# Patient Record
Sex: Male | Born: 1950 | Race: Black or African American | Hispanic: No | Smoking: Never smoker
Health system: Southern US, Community
[De-identification: ages and names within clinical notes are randomized; demographics above are authoritative.]

## PROBLEM LIST (undated history)

## (undated) DIAGNOSIS — M1990 Unspecified osteoarthritis, unspecified site: Secondary | ICD-10-CM

## (undated) DIAGNOSIS — E119 Type 2 diabetes mellitus without complications: Secondary | ICD-10-CM

## (undated) DIAGNOSIS — I509 Heart failure, unspecified: Secondary | ICD-10-CM

## (undated) DIAGNOSIS — J45909 Unspecified asthma, uncomplicated: Secondary | ICD-10-CM

## (undated) DIAGNOSIS — I5022 Chronic systolic (congestive) heart failure: Secondary | ICD-10-CM

## (undated) DIAGNOSIS — I10 Essential (primary) hypertension: Secondary | ICD-10-CM

## (undated) DIAGNOSIS — G4733 Obstructive sleep apnea (adult) (pediatric): Secondary | ICD-10-CM

## (undated) DIAGNOSIS — M109 Gout, unspecified: Secondary | ICD-10-CM

## (undated) DIAGNOSIS — I447 Left bundle-branch block, unspecified: Secondary | ICD-10-CM

## (undated) DIAGNOSIS — I2510 Atherosclerotic heart disease of native coronary artery without angina pectoris: Secondary | ICD-10-CM

## (undated) DIAGNOSIS — I255 Ischemic cardiomyopathy: Secondary | ICD-10-CM

## (undated) DIAGNOSIS — E785 Hyperlipidemia, unspecified: Secondary | ICD-10-CM

## (undated) DIAGNOSIS — I480 Paroxysmal atrial fibrillation: Secondary | ICD-10-CM

## (undated) DIAGNOSIS — Z95810 Presence of automatic (implantable) cardiac defibrillator: Secondary | ICD-10-CM

## (undated) DIAGNOSIS — I469 Cardiac arrest, cause unspecified: Secondary | ICD-10-CM

## (undated) DIAGNOSIS — E7800 Pure hypercholesterolemia, unspecified: Secondary | ICD-10-CM

## (undated) DIAGNOSIS — I499 Cardiac arrhythmia, unspecified: Secondary | ICD-10-CM

## (undated) DIAGNOSIS — Z9989 Dependence on other enabling machines and devices: Secondary | ICD-10-CM

## (undated) NOTE — *Deleted (*Deleted)
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 Profile No results for input(s): CHOL, HDL, LDLCALC, TRIG, CHOLHDL, LDLDIRECT in the last 72 hours. Thyroid function studies No results for input(s): TSH, T4TOTAL, T3FREE, THYROIDAB in the last 72 hours.  Invalid input(s): FREET3 Anemia work up No results for input(s): VITAMINB12, FOLATE, FERRITIN, TIBC, IRON, RETICCTPCT in the last 72 hours. Microbiology No results found for this or any previous visit (from the past 240 hour(s)).   Signed: Lorin Glass  [facility] 08/25/2020, 12:41 PM

---

## 2003-03-02 ENCOUNTER — Encounter: Payer: Self-pay | Admitting: Emergency Medicine

## 2003-03-02 ENCOUNTER — Inpatient Hospital Stay (HOSPITAL_COMMUNITY): Admission: EM | Admit: 2003-03-02 | Discharge: 2003-03-04 | Payer: Self-pay | Admitting: Emergency Medicine

## 2003-03-03 ENCOUNTER — Encounter: Payer: Self-pay | Admitting: *Deleted

## 2003-04-15 ENCOUNTER — Emergency Department (HOSPITAL_COMMUNITY): Admission: EM | Admit: 2003-04-15 | Discharge: 2003-04-15 | Payer: Self-pay | Admitting: Emergency Medicine

## 2004-11-10 ENCOUNTER — Emergency Department (HOSPITAL_COMMUNITY): Admission: EM | Admit: 2004-11-10 | Discharge: 2004-11-10 | Payer: Self-pay | Admitting: Emergency Medicine

## 2005-03-29 ENCOUNTER — Emergency Department (HOSPITAL_COMMUNITY): Admission: EM | Admit: 2005-03-29 | Discharge: 2005-03-29 | Payer: Self-pay | Admitting: Emergency Medicine

## 2011-04-09 ENCOUNTER — Emergency Department (HOSPITAL_COMMUNITY): Payer: No Typology Code available for payment source

## 2011-04-09 ENCOUNTER — Emergency Department (HOSPITAL_COMMUNITY)
Admission: EM | Admit: 2011-04-09 | Discharge: 2011-04-09 | Disposition: A | Discharge status: Home / Self Care | Payer: No Typology Code available for payment source | Attending: Emergency Medicine | Admitting: Emergency Medicine

## 2011-04-09 DIAGNOSIS — S8000XA Contusion of unspecified knee, initial encounter: Secondary | ICD-10-CM | POA: Insufficient documentation

## 2011-04-09 DIAGNOSIS — E7800 Pure hypercholesterolemia, unspecified: Secondary | ICD-10-CM | POA: Insufficient documentation

## 2011-04-09 DIAGNOSIS — I10 Essential (primary) hypertension: Secondary | ICD-10-CM | POA: Insufficient documentation

## 2011-04-09 DIAGNOSIS — M549 Dorsalgia, unspecified: Secondary | ICD-10-CM | POA: Insufficient documentation

## 2012-08-12 ENCOUNTER — Emergency Department (HOSPITAL_COMMUNITY)
Admission: EM | Admit: 2012-08-12 | Discharge: 2012-08-13 | Disposition: A | Discharge status: Home / Self Care | Payer: PRIVATE HEALTH INSURANCE | Attending: Emergency Medicine | Admitting: Emergency Medicine

## 2012-08-12 ENCOUNTER — Encounter (HOSPITAL_COMMUNITY): Payer: Self-pay | Admitting: Adult Health

## 2012-08-12 DIAGNOSIS — E119 Type 2 diabetes mellitus without complications: Secondary | ICD-10-CM | POA: Insufficient documentation

## 2012-08-12 DIAGNOSIS — M109 Gout, unspecified: Secondary | ICD-10-CM | POA: Insufficient documentation

## 2012-08-12 DIAGNOSIS — I10 Essential (primary) hypertension: Secondary | ICD-10-CM | POA: Insufficient documentation

## 2012-08-12 DIAGNOSIS — K047 Periapical abscess without sinus: Secondary | ICD-10-CM | POA: Insufficient documentation

## 2012-08-12 DIAGNOSIS — Z79899 Other long term (current) drug therapy: Secondary | ICD-10-CM | POA: Insufficient documentation

## 2012-08-12 HISTORY — DX: Essential (primary) hypertension: I10

## 2012-08-12 HISTORY — DX: Gout, unspecified: M10.9

## 2012-08-12 MED ORDER — OXYCODONE-ACETAMINOPHEN 5-325 MG PO TABS
2.0000 | ORAL_TABLET | Freq: Once | ORAL | Status: AC
  Administered 2012-08-13: 2 via ORAL
  Filled 2012-08-12: qty 2

## 2012-08-12 NOTE — ED Notes (Signed)
C/o upper left jaw pain and swelling that began last night and has gotten worse over 24 hours. Airway intact. Top left teeth with decay.

## 2012-08-13 MED ORDER — OXYCODONE-ACETAMINOPHEN 5-325 MG PO TABS: 1.0000 | ORAL_TABLET | Freq: Four times a day (QID) | ORAL | Status: DC | PRN

## 2012-08-13 MED ORDER — PENICILLIN V POTASSIUM 500 MG PO TABS: 500.0000 mg | ORAL_TABLET | Freq: Four times a day (QID) | ORAL | Status: AC

## 2012-08-13 NOTE — ED Notes (Signed)
Pt discharged.BP on the higher side and known by the PA.GCS 15

## 2012-08-13 NOTE — ED Provider Notes (Signed)
History     CSN: [id number]  Arrival date & time 08/12/12  2202   First MD Initiated Contact with Patient 08/12/12 2328      Chief Complaint  Patient presents with  . Dental Pain    (Consider location/radiation/quality/duration/timing/severity/associated sxs/prior treatment) HPI Comments: Patient presents today with a chief complaint of dental pain and dental abscess.  He reports that he started to notice it yesterday.  Pain has become gradually worse.  He has not noticed any drainage from the area.  He denies fever or chills.  He does not have a dentist.  He has had dental abscesses in the past.    Patient is a 61 y.o. male presenting with tooth pain. The history is provided by the patient.  Dental PainThe primary symptoms include mouth pain. Primary symptoms do not include dental injury, oral bleeding, headaches, fever or shortness of breath. The symptoms are worsening. The symptoms occur constantly.  Additional symptoms include: gum swelling and gum tenderness. Additional symptoms do not include: trismus, facial swelling, trouble swallowing, pain with swallowing and drooling.    Past Medical History  Diagnosis Date  . Hypertension   . Gout   . Diabetes mellitus without complication     History reviewed. No pertinent past surgical history.  History reviewed. No pertinent family history.  History  Substance Use Topics  . Smoking status: Never Smoker   . Smokeless tobacco: Not on file  . Alcohol Use: No      Review of Systems  Constitutional: Negative for fever and chills.  HENT: Positive for dental problem. Negative for facial swelling, drooling, trouble swallowing, neck pain, neck stiffness and voice change.   Respiratory: Negative for shortness of breath.   Cardiovascular: Negative for chest pain.  Gastrointestinal: Negative for nausea and vomiting.  Genitourinary: Negative for decreased urine volume.  Skin: Negative for color change.  Neurological: Negative for  dizziness, syncope, facial asymmetry, light-headedness and headaches.  All other systems reviewed and are negative.    Allergies  Review of patient's allergies indicates no known allergies.  Home Medications   Current Outpatient Rx  Name  Route  Sig  Dispense  Refill  . AMLODIPINE-OLMESARTAN 10-40 MG PO TABS   Oral   Take 1 tablet by mouth daily.         . FUROSEMIDE 40 MG PO TABS   Oral   Take 40 mg by mouth daily.         [name] METFORMIN HCL 1000 MG PO TABS   Oral   Take 1,000 mg by mouth daily.         [name] METOPROLOL SUCCINATE ER 200 MG PO TB24   Oral   Take 300 mg by mouth daily.         [name] ROSUVASTATIN CALCIUM 10 MG PO TABS   Oral   Take 10 mg by mouth daily.           BP 203/80  Pulse 88  Temp 97.4 F (36.3 C) (Oral)  Resp 18  SpO2 96%  Physical Exam  Nursing note and vitals reviewed. Constitutional: He appears well-developed and well-nourished. No distress.  HENT:  Head: Normocephalic and atraumatic. No trismus in the jaw.  Right Ear: Tympanic membrane and ear canal normal.  Left Ear: Tympanic membrane and ear canal normal.  Mouth/Throat: Uvula is midline and oropharynx is clear and moist. Dental abscesses and dental caries present.       Dental abscess of left upper gingiva  Neck:  Normal range of motion. Neck supple.  Cardiovascular: Normal rate, regular rhythm and normal heart sounds.   Pulmonary/Chest: Effort normal and breath sounds normal.  Lymphadenopathy:       Head (right side): No submental, no submandibular and no tonsillar adenopathy present.       Head (left side): No submental, no submandibular and no tonsillar adenopathy present.    He has no cervical adenopathy.  Neurological: He is alert.  Skin: Skin is warm and dry. He is not diaphoretic.  Psychiatric: He has a normal mood and affect.    ED Course  Procedures (including critical care time)  Labs Reviewed - No data to display No results found.   No diagnosis  found.  INCISION AND DRAINAGE Performed by: Anne Shutter, Elsye Mccollister Consent: Verbal consent obtained. Risks and benefits: risks, benefits and alternatives were discussed Type: abscess  Body area: left upper gingiva  Anesthesia: local infiltration  Local anesthetic: lidocaine 2% without epinephrine  Anesthetic total:2 ml  Drainage: purulent  Drainage amount: small  Patient tolerance: Patient tolerated the procedure well with no immediate complications.    MDM  Patient presenting with dental abscess.  Abscess incised and drained.  Patient also hypertensive.  Patient is not having any symptoms from this.  Patient is currently on Toprol-XL for his blood pressure. He reports that he checks his blood pressure every day at home and his systolic is usually 140-150.  Therefore, do not feel that blood pressure needs to be treated at this time.  Feel that elevated blood pressure is most likely secondary to pain.        Pascal Lux Nora, PA-C 08/13/12 1313  Pascal Lux [place], PA-C 08/13/12 1313

## 2012-08-13 NOTE — ED Notes (Signed)
I and D done by PA.

## 2012-08-15 NOTE — ED Provider Notes (Signed)
Medical screening examination/treatment/procedure(s) were performed by non-physician practitioner and as supervising physician I was immediately available for consultation/collaboration.   Rozena Fierro H Sahiba Granholm, MD 08/15/12 1641 

## 2012-12-15 ENCOUNTER — Encounter (HOSPITAL_COMMUNITY): Payer: Self-pay | Admitting: *Deleted

## 2012-12-15 ENCOUNTER — Emergency Department (HOSPITAL_COMMUNITY)
Admission: EM | Admit: 2012-12-15 | Discharge: 2012-12-16 | Disposition: A | Discharge status: Home / Self Care | Payer: PRIVATE HEALTH INSURANCE | Attending: Emergency Medicine | Admitting: Emergency Medicine

## 2012-12-15 DIAGNOSIS — R0789 Other chest pain: Secondary | ICD-10-CM | POA: Insufficient documentation

## 2012-12-15 DIAGNOSIS — R062 Wheezing: Secondary | ICD-10-CM | POA: Insufficient documentation

## 2012-12-15 DIAGNOSIS — R0602 Shortness of breath: Secondary | ICD-10-CM | POA: Insufficient documentation

## 2012-12-15 DIAGNOSIS — I509 Heart failure, unspecified: Secondary | ICD-10-CM | POA: Insufficient documentation

## 2012-12-15 HISTORY — DX: Unspecified asthma, uncomplicated: J45.909

## 2012-12-15 NOTE — ED Notes (Addendum)
Pt states that he has been having intermittant asthma attacks. Pt states that they are triggered by different events and that he tonight was at the store and started breathing heavy. Pt states that the least little activity starts his SOB and asthma Pt states that he used his rescue inhaler and that it helped. Pt not in an respiratory distress at this time. Talking in full sentences. Pt has no wheezing.

## 2012-12-16 ENCOUNTER — Emergency Department (HOSPITAL_COMMUNITY): Payer: PRIVATE HEALTH INSURANCE

## 2012-12-16 LAB — CBC WITH DIFFERENTIAL/PLATELET
Basophils Absolute: 0 10*3/uL (ref 0.0–0.1)
Basophils Relative: 0 % (ref 0–1)
Eosinophils Absolute: 0.6 10*3/uL (ref 0.0–0.7)
Eosinophils Relative: 5 % (ref 0–5)
HCT: 39.4 % (ref 39.0–52.0)
Hemoglobin: 13.2 g/dL (ref 13.0–17.0)
Lymphocytes Relative: 21 % (ref 12–46)
Lymphs Abs: 2.6 10*3/uL (ref 0.7–4.0)
MCH: 26.2 pg (ref 26.0–34.0)
MCHC: 33.5 g/dL (ref 30.0–36.0)
MCV: 78.3 fL (ref 78.0–100.0)
Monocytes Absolute: 1 10*3/uL (ref 0.1–1.0)
Monocytes Relative: 8 % (ref 3–12)
Neutro Abs: 8.4 10*3/uL — ABNORMAL HIGH (ref 1.7–7.7)
Neutrophils Relative %: 66 % (ref 43–77)
Platelets: 198 10*3/uL (ref 150–400)
RBC: 5.03 MIL/uL (ref 4.22–5.81)
RDW: 14 % (ref 11.5–15.5)
WBC: 12.6 10*3/uL — ABNORMAL HIGH (ref 4.0–10.5)

## 2012-12-16 LAB — BASIC METABOLIC PANEL
BUN: 11 mg/dL (ref 6–23)
CO2: 24 mEq/L (ref 19–32)
Calcium: 10.2 mg/dL (ref 8.4–10.5)
Chloride: 102 mEq/L (ref 96–112)
Creatinine, Ser: 0.9 mg/dL (ref 0.50–1.35)
GFR calc Af Amer: >90 mL/min (ref >90)
GFR calc non Af Amer: 89 mL/min — ABNORMAL LOW (ref >90)
Glucose, Bld: 111 mg/dL — ABNORMAL HIGH (ref 70–99)
Potassium: 3.7 mEq/L (ref 3.5–5.1)
Sodium: 140 mEq/L (ref 135–145)

## 2012-12-16 LAB — TROPONIN I: Troponin I: <0.3 ng/mL (ref <0.30)

## 2012-12-16 LAB — PRO B NATRIURETIC PEPTIDE: Pro B Natriuretic peptide (BNP): 1529 pg/mL — ABNORMAL HIGH (ref 0–125)

## 2012-12-16 MED ORDER — PREDNISONE 20 MG PO TABS
40.0000 mg | ORAL_TABLET | Freq: Once | ORAL | Status: AC
  Administered 2012-12-16: 40 mg via ORAL
  Filled 2012-12-16: qty 2

## 2012-12-16 MED ORDER — AEROCHAMBER PLUS W/MASK MISC: 1.0000 | Freq: Once | Status: DC

## 2012-12-16 MED ORDER — IPRATROPIUM-ALBUTEROL 20-100 MCG/ACT IN AERS
1.0000 | INHALATION_SPRAY | Freq: Four times a day (QID) | RESPIRATORY_TRACT | Status: DC | PRN
  Administered 2012-12-16: 1 via RESPIRATORY_TRACT
  Filled 2012-12-16: qty 4

## 2012-12-16 MED ORDER — AEROCHAMBER PLUS W/MASK MISC
Status: AC
  Filled 2012-12-16: qty 1

## 2012-12-16 NOTE — ED Provider Notes (Signed)
Date: 12/16/2012  Rate: 82  Rhythm: normal sinus rhythm  QRS Axis: Left axis deviation  Intervals: normal  ST/T Wave abnormalities: normal  Conduction Disutrbances: Left bundle branch block  Narrative Interpretation: Left bundle branch block, this is not a new finding - EKG morphology looks the same as prior ECG      [name], MD 12/17/12 1338

## 2012-12-16 NOTE — ED Provider Notes (Signed)
History     CSN: [id number]  Arrival date & time 12/15/12  2259   First MD Initiated Contact with Patient 12/15/12 2351      Chief Complaint  Patient presents with  . Shortness of Breath    (Consider location/radiation/quality/duration/timing/severity/associated sxs/prior treatment) HPI Comments: Patient is a 62 year old gentleman with a history of hypertension, diabetes, and asthma who presents for increasing episodes of shortness of breath over the last 2 weeks.  Patient states that he has found himself becoming short of breath twice a day for the last 2 weeks.  Patient was evaluated by his primary care physician and thought to have asthma.  He was prescribed a Combivent inhaler to take as needed for shortness of breath and wheezing.  Patient states he uses his inhaler with these episodes with some relief of symptoms. He states that lying flat on his back makes the symptoms worse.  Patient denies any associated symptoms including fever or recent illness, lightheadedness or dizziness, chest pain, nausea or vomiting, abdominal pain, numbness or tingling in his extremities, leg swelling or syncope.  Patient is a 62 y.o. male presenting with shortness of breath. The history is provided by the patient. No language interpreter was used.  Shortness of Breath Associated symptoms: no abdominal pain, no chest pain, no fever and no vomiting     Past Medical History  Diagnosis Date  . Hypertension   . Gout   . Diabetes mellitus without complication   . Asthma     History reviewed. No pertinent past surgical history.  History reviewed. No pertinent family history.  History  Substance Use Topics  . Smoking status: Never Smoker   . Smokeless tobacco: Not on file  . Alcohol Use: No      Review of Systems  Constitutional: Negative for fever and chills.  Eyes: Negative for visual disturbance.  Respiratory: Positive for chest tightness and shortness of breath.   Cardiovascular: Negative  for chest pain and leg swelling.  Gastrointestinal: Negative for nausea, vomiting and abdominal pain.  Genitourinary: Negative.   Skin: Negative for color change and pallor.  Neurological: Negative for dizziness, syncope, weakness, light-headedness and numbness.  All other systems reviewed and are negative.    Allergies  Review of patient's allergies indicates no known allergies.  Home Medications   Current Outpatient Rx  Name  Route  Sig  Dispense  Refill  . allopurinol (ZYLOPRIM) 100 MG tablet   Oral   Take 100 mg by mouth daily.         [name] amLODipine-olmesartan (AZOR) 10-40 MG per tablet   Oral   Take 1 tablet by mouth daily.         . furosemide (LASIX) 40 MG tablet   Oral   Take 40 mg by mouth daily.         . Ipratropium-Albuterol (COMBIVENT RESPIMAT) 20-100 MCG/ACT AERS respimat   Inhalation   Inhale 1 puff into the lungs every 6 (six) hours as needed for wheezing or shortness of breath.         . metFORMIN (GLUCOPHAGE) 1000 MG tablet   Oral   Take 1,000 mg by mouth daily.         . metoprolol (TOPROL-XL) 200 MG 24 hr tablet   Oral   Take 300 mg by mouth daily.         . rosuvastatin (CRESTOR) 10 MG tablet   Oral   Take 10 mg by mouth daily.  BP 183/89  Pulse 77  Temp(Src) 98 F (36.7 C) (Oral)  Resp 18  SpO2 99%  Physical Exam  Nursing note and vitals reviewed. Constitutional: He is oriented to person, place, and time. No distress.  Morbidly obese male  HENT:  Head: Normocephalic and atraumatic.  Right Ear: External ear normal.  Left Ear: External ear normal.  Mouth/Throat: Oropharynx is clear and moist. No oropharyngeal exudate.  Eyes: Conjunctivae and EOM are normal. Pupils are equal, round, and reactive to light. No scleral icterus.  Neck: Normal range of motion. Neck supple.  Cardiovascular: Normal rate, regular rhythm, normal heart sounds and intact distal pulses.   Capillary refill less than 2 seconds in bilateral  upper extremities.  Distal radial, dorsalis pedis, and posterior tibial pulses 2+ bilaterally. No lower extremity pitting edema appreciated.  Pulmonary/Chest: Effort normal. No respiratory distress. He has wheezes. He has no rales. He exhibits no tenderness.  Mild expiratory wheezes appreciated in the mid left and right lower lung fields; moving air well.  Abdominal: Soft. He exhibits no distension. There is no tenderness. There is no rebound and no guarding.  Musculoskeletal: Normal range of motion. He exhibits no edema.  Lymphadenopathy:    He has no cervical adenopathy.  Neurological: He is alert and oriented to person, place, and time.  Skin: Skin is warm and dry. He is not diaphoretic. No erythema.  Psychiatric: He has a normal mood and affect. His behavior is normal.    ED Course  Procedures (including critical care time)  Labs Reviewed  CBC WITH DIFFERENTIAL - Abnormal; Notable for the following:    WBC 12.6 (*)    Neutro Abs 8.4 (*)    All other components within normal limits  BASIC METABOLIC PANEL - Abnormal; Notable for the following:    Glucose, Bld 111 (*)    GFR calc non Af Amer 89 (*)    All other components within normal limits  PRO B NATRIURETIC PEPTIDE - Abnormal; Notable for the following:    Pro B Natriuretic peptide (BNP) 1529.0 (*)    All other components within normal limits  TROPONIN I   Dg Chest 2 View  12/16/2012  *RADIOLOGY REPORT*  Clinical Data: Short of breath.  Asthma.  CHEST - 2 VIEW  Comparison: None.  Findings: Cardiomegaly is present.   There is basilar atelectasis. No focal consolidation.  Thoracic spine DISH.  Pulmonary vascular congestion is present with cephalization of pulmonary blood flow. Trachea midline.  IMPRESSION: Cardiomegaly and pulmonary vascular congestion.   Original Report Authenticated By: Andreas Newport, M.D.     Date: 12/16/2012  Rate: 82  Rhythm: normal sinus rhythm  QRS Axis: left  Intervals: normal  ST/T Wave  abnormalities: normal  Conduction Disutrbances:left bundle branch block  Narrative Interpretation: NSR with LAD and LBBB unchanged from prior; no STEMI  Old EKG Reviewed: unchanged from 2004    1. Shortness of breath   2. Congestive heart disease      MDM  Patient presents for increasing episodes of shortness of breath x2 weeks.  Patient was seen by his primary care provider and given an albuterol inhaler for asthma-like symptoms.  Chest x-ray today significant for cardiomegaly and pulmonary vascular congestion consistent with congestive heart failure.  Basic labs as well as a BNP, troponin level, and EKG ordered for further evaluation of the patient's symptoms.  Patient currently well and nontoxic appearing and in no acute distress.  BNP elevated to 1529; troponin level normal and EKG  findings unchanged from prior in 2004. Patient vitals have remained stable, satting 99% on RA without tachycardia or tachypnea. Patient stable for d/c with PCP follow up regarding his congestive heart disease findings. He will be given a spacer to use with his combivent inhaler as well. Patient states comfort and understanding with this d/c plan with no unaddressed concerns. Patient seen, also, by Dr. Rulon Abide who is in agreement with this work up and management plan.      Antony Madura, PA-C 12/17/12 1324

## 2012-12-17 NOTE — ED Provider Notes (Signed)
Medical screening examination/treatment/procedure(s) were conducted as a shared visit with non-physician practitioner(s) and myself.  I personally evaluated the patient during the encounter [name], M.D.  Dean [name] is a 62 y.o. male past medical history significant for diabetes, hypertension and asthma. He says he's had intermittent worsening of his shortness of breath over the course of the last 2 weeks, occasionally he has found it difficult to lay flat. He was recently diagnosed with asthma by his primary care provider and given a Combivent inhaler, has not been using this with a spacer. He's had wheezing associated with the shortness of breath and denies any chest pains. When he uses the inhaler it relieves his shortness of breath which is described as moderate. Denies any syncope, recent illness, fevers or chills, abdominal pain, nausea vomiting, leg swelling. Denies rash or diarrhea.  At least 10pt or greater review of systems completed and are negative except where specified in the HPI.  VITAL SIGNS:   Filed Vitals:   12/16/12 0303  BP: 183/89  Pulse: 77  Temp: 98 F (36.7 C)  Resp: 18   CONSTITUTIONAL: Awake, oriented, appears non-toxic HENT: Atraumatic, normocephalic, oral mucosa pink and moist, airway patent. Nares patent without drainage. External ears normal. EYES: Conjunctiva clear, EOMI, PERRLA NECK: Trachea midline, non-tender, supple CARDIOVASCULAR: Normal heart rate, Normal rhythm, No murmurs, rubs, gallops PULMONARY/CHEST: Clear to auscultation, no rhonchi, wheezes, or rales. Symmetrical breath sounds. Non-tender. ABDOMINAL: Obese, non-distended, soft, non-tender - no rebound or guarding.  BS normal. NEUROLOGIC: Non-focal, moving all four extremities, no gross sensory or motor deficits. EXTREMITIES: No clubbing, cyanosis, or edema SKIN: Warm, Dry, No erythema, No rash   Labs Reviewed  CBC WITH DIFFERENTIAL - Abnormal; Notable for the following:    WBC  12.6 (*)    Neutro Abs 8.4 (*)    All other components within normal limits  BASIC METABOLIC PANEL - Abnormal; Notable for the following:    Glucose, Bld 111 (*)    GFR calc non Af Amer 89 (*)    All other components within normal limits  PRO B NATRIURETIC PEPTIDE - Abnormal; Notable for the following:    Pro B Natriuretic peptide (BNP) 1529.0 (*)    All other components within normal limits  TROPONIN I    Dg Chest 2 View  12/16/2012  *RADIOLOGY REPORT*  Clinical Data: Short of breath.  Asthma.  CHEST - 2 VIEW  Comparison: None.  Findings: Cardiomegaly is present.   There is basilar atelectasis. No focal consolidation.  Thoracic spine DISH.  Pulmonary vascular congestion is present with cephalization of pulmonary blood flow. Trachea midline.  IMPRESSION: Cardiomegaly and pulmonary vascular congestion.   Original Report Authenticated By: Andreas Newport, M.D.     MDM: Dean [name] is a 62 y.o. male with a recent asthma diagnosis presents with shortness of breath x2 weeks. Given the context of his shortness of breath I doubt anginal equivalent, I doubt coronary syndrome, pulmonary embolism (low risk by Wells criteria.) Patient may have some intermittent shortness of breath associated with hypertension however on PAs initial exam the patient was wheezing. On my exam the patient has are that breathing treatments he is feeling better and is no longer wheezing. We'll have the patient followup with primary care physician, also will give the patient a spacer to use with the inhaler which will maximize the efficacy of the bronchodilators. I explained this to the patient he understands accepts the medical plan, questions have been answered.   I also  reviewed the EKG, pertinent labs and nursing notes today.    [name], MD 12/17/12 1338

## 2013-01-01 ENCOUNTER — Encounter: Payer: Self-pay | Admitting: Neurology

## 2013-01-02 NOTE — Progress Notes (Signed)
Quick Note:  Patient needs to improve his compliance on CPAP. Currently at 77 percent. Good resolution of apnea when the machine is used. The DME is [facility] . ______

## 2013-04-04 ENCOUNTER — Telehealth: Payer: Self-pay | Admitting: *Deleted

## 2013-04-04 NOTE — Telephone Encounter (Signed)
Called patient to schedule a follow up appt with Dr. Vickey Huger at Dr. Wilmer Floor request.  Person answered and said Dean [name] was out, they would not take a message.  Asked me to call back in 2-3 hours.

## 2013-05-07 ENCOUNTER — Encounter: Payer: Self-pay | Admitting: Neurology

## 2013-07-17 ENCOUNTER — Other Ambulatory Visit: Payer: Self-pay | Admitting: Internal Medicine

## 2013-07-17 DIAGNOSIS — M79604 Pain in right leg: Secondary | ICD-10-CM

## 2013-07-17 DIAGNOSIS — M79605 Pain in left leg: Secondary | ICD-10-CM

## 2013-07-17 DIAGNOSIS — E119 Type 2 diabetes mellitus without complications: Secondary | ICD-10-CM

## 2013-07-23 ENCOUNTER — Other Ambulatory Visit: Payer: PRIVATE HEALTH INSURANCE

## 2013-08-01 ENCOUNTER — Other Ambulatory Visit: Payer: PRIVATE HEALTH INSURANCE

## 2013-08-25 ENCOUNTER — Ambulatory Visit
Admission: RE | Admit: 2013-08-25 | Discharge: 2013-08-25 | Disposition: A | Discharge status: Home / Self Care | Payer: Medicare Other | Source: Ambulatory Visit | Attending: Internal Medicine | Admitting: Internal Medicine

## 2013-08-25 DIAGNOSIS — E119 Type 2 diabetes mellitus without complications: Secondary | ICD-10-CM

## 2013-08-25 DIAGNOSIS — M79605 Pain in left leg: Secondary | ICD-10-CM

## 2013-08-25 DIAGNOSIS — M79604 Pain in right leg: Secondary | ICD-10-CM

## 2014-03-02 ENCOUNTER — Encounter: Payer: Medicare Other | Attending: Internal Medicine | Admitting: Dietician

## 2014-03-02 ENCOUNTER — Encounter: Payer: Self-pay | Admitting: Dietician

## 2014-03-02 VITALS — Ht 66.0 in | Wt 295.0 lb

## 2014-03-02 DIAGNOSIS — E669 Obesity, unspecified: Secondary | ICD-10-CM | POA: Diagnosis not present

## 2014-03-02 DIAGNOSIS — E6609 Other obesity due to excess calories: Secondary | ICD-10-CM | POA: Insufficient documentation

## 2014-03-02 DIAGNOSIS — Z6841 Body Mass Index (BMI) 40.0 and over, adult: Secondary | ICD-10-CM | POA: Insufficient documentation

## 2014-03-02 DIAGNOSIS — I10 Essential (primary) hypertension: Secondary | ICD-10-CM | POA: Insufficient documentation

## 2014-03-02 DIAGNOSIS — Z6835 Body mass index (BMI) 35.0-35.9, adult: Secondary | ICD-10-CM | POA: Insufficient documentation

## 2014-03-02 DIAGNOSIS — E1169 Type 2 diabetes mellitus with other specified complication: Secondary | ICD-10-CM | POA: Insufficient documentation

## 2014-03-02 DIAGNOSIS — Z713 Dietary counseling and surveillance: Secondary | ICD-10-CM | POA: Diagnosis not present

## 2014-03-02 DIAGNOSIS — E119 Type 2 diabetes mellitus without complications: Secondary | ICD-10-CM | POA: Diagnosis not present

## 2014-03-02 NOTE — Progress Notes (Signed)
Appt start time: 1100 end time:  1200.  Assessment:  Patient was seen on 03/02/14 for individual diabetes education. Pt reports with very good control of T2DM, and cholesterol. His primary concerns for today are related to weight control. No previous formal T2DM education.  Current HbA1c: 6.4  Preferred Learning Style:   No preference indicated   Learning Readiness:   Contemplating  MEDICATIONS: see list.  DIETARY INTAKE: Usual eating pattern includes 1-3 meals and 1-2 snacks per day. Everyday foods include sweet tea, fruit, chix.  Avoided foods include none noted.    24-hr recall:  B ( AM): sausage and egg mcmuffin once or twice per week. Skips breakfast most days. Water, sweet tea.   Snk ( AM): none  L ( PM): chix sandwich (either fried or grilled) from [employer], small fry, tea. Sometimes skips lunch, especially if he ate breakfast.  Snk ( PM): tries to eat a lot of fruit- apples, grapes, bananas, oranges. Water or tea. Occasional soda.  D ( PM): mostly chix (usually baked, fried about 1-2 times per month- pt has cut back), not much pork, hamburger. Sides include pinto beans, baked beans, black eyed peas, pork and beans, broccoli, turnip greens, cabbage, collards. Cutting back on sweets, homemade cake at grandson's birthday. Snk ( PM): fruit Beverages: water, sweet tea, coffee (with 2 spoons cream and a couple spoons sugar) once or twice per week, OJ, no kool aid or lemonade, no EtOH.    Usual physical activity: Used to walk more, but in last few years not much. Owns a treadmill. Has access to a gym through insurance. Pt notes muscle pain when walking, possibly related to statin use, limited to calf muscle on most occasions.  Progress Towards Goal(s):  In progress.   Nutritional Diagnosis:  NB-1.1 Food and nutrition-related knowledge deficit As related to lack of previous DM education.  As evidenced by pt statements of same, high intake of sweetened beverages.    Intervention:   Nutrition counseling provided.  Discussed diabetes disease process and treatment options.  Discussed physiology of diabetes and role of obesity on insulin resistance.  Encouraged moderate weight reduction to improve glucose levels.  Discussed role of medications and diet in glucose control  Provided education on macronutrients on glucose levels.  Provided education on carb counting, importance of regularly scheduled meals/snacks, and meal planning  Discussed effects of physical activity on glucose levels and long-term glucose control.  Recommended 150 minutes of physical activity/week.  Reviewed patient medications.  Discussed role of medication on blood glucose and possible side effects  Discussed blood glucose monitoring and interpretation.  Discussed recommended target ranges and individual ranges.    Described short-term complications: hyper- and hypo-glycemia.  Discussed causes,symptoms, and treatment options.  Discussed prevention, detection, and treatment of long-term complications.  Discussed the role of prolonged elevated glucose levels on body systems.  Discussed role of stress on blood glucose levels and discussed strategies to manage psychosocial issues.  Discussed recommendations for long-term diabetes self-care.  Established checklist for medical, dental, and emotional self-care.  Teaching Method Utilized:  Visual Auditory  Handouts given during visit include:  Living Well With Diabetes  The Plate Method  Barriers to learning/adherence to lifestyle change: previous dietary habits, muscle pain  Diabetes self-care support plan:   [facility] support group  RD advised pt to eat 3 protein-rich meals per day with goal of at least 30 grams per meal (RD demonstrated methods to accomplish), only one CHO rich food at lunch, and utilize  the Plate Method for dinner. RD also advised to discontinue sweetened beverages. RD advised increase dosage of vitamin D to 5000 IUs per day to treat  muscle pain. RD advised 30 minutes per day exercise progressing to 60 minutes per day by increasing 5 minutes per day q 2 weeks.  Demonstrated degree of understanding via:  Teach Back   Monitoring/Evaluation:  Dietary intake, exercise, portion control, meal pattern, and body weight in 2 month(s).

## 2014-05-04 ENCOUNTER — Ambulatory Visit: Payer: Medicare Other | Admitting: Dietician

## 2014-05-04 ENCOUNTER — Ambulatory Visit: Payer: Medicare Other | Admitting: *Deleted

## 2014-06-04 ENCOUNTER — Encounter: Payer: Medicare Other | Attending: Internal Medicine | Admitting: *Deleted

## 2014-06-04 DIAGNOSIS — I10 Essential (primary) hypertension: Secondary | ICD-10-CM | POA: Diagnosis not present

## 2014-06-04 DIAGNOSIS — E669 Obesity, unspecified: Secondary | ICD-10-CM | POA: Diagnosis not present

## 2014-06-04 DIAGNOSIS — Z713 Dietary counseling and surveillance: Secondary | ICD-10-CM | POA: Insufficient documentation

## 2014-06-04 DIAGNOSIS — Z6841 Body Mass Index (BMI) 40.0 and over, adult: Secondary | ICD-10-CM | POA: Diagnosis not present

## 2014-06-04 DIAGNOSIS — E119 Type 2 diabetes mellitus without complications: Secondary | ICD-10-CM | POA: Insufficient documentation

## 2014-06-04 NOTE — Progress Notes (Signed)
Appt start time: 1100 end time:  1130.  Assessment:  Patient was seen on 06/03/14 for follow up individual diabetes education.  He was seen by Daine Floras, RD in June.  Dean [name] states he has been watching what he eats since last visit.  He also walks some, but not as much as he feels like he should.  However, dietary recall reveals no changes to his food intake.  He reports not understanding proper portions and what foods contain carbohydrates.  He did receive very thorough education at his last visit   Current HbA1c: not sure right now.  Haven't gotten results back yet from last week's test  Preferred Learning Style:   No preference indicated   Learning Readiness:   Contemplating  MEDICATIONS: see list.  DIETARY INTAKE: Usual eating pattern includes 1-3 meals and 1-2 snacks per day. Everyday foods include sweet tea, fruit, chix.  Avoided foods include none noted.    24-hr recall:  B ( AM): eggs, grits, sausage patti, cereal; english muffin. Skips 3 days.  Might go out 1-2 times a week.  Drinks tea or water Snk ( AM): none  L ( PM): chicken sandwich or hamburger with fries sometimes; sub sandwich; skips 2-3 times Snk ( PM): not usually D ( PM): hot dog; beef; chicken occasionally rice or salad or broccoli or sometimes beans or turnip greens.  Vegetables 4 times Snk ( PM): fruit: nectarine, peaches, grapes Beverages: water, sweet tea, coffee (with 2 spoons cream and a couple spoons sugar) once or twice per week, OJ, no kool aid or lemonade, no EtOH.    Usual physical activity: walks rarely .  Has treadmill at home, but doesn't use it.  Complains of leg problems.  Feels like his Crestor causes joint problems   Progress Towards Goal(s):  In progress.   Nutritional Diagnosis:  NB-1.1 Food and nutrition-related knowledge deficit As related to lack of understanding previous DM education.  As evidenced by pt statements of same, high intake of sweetened beverages.    Intervention:   Nutrition counseling provided.  Discussed importance of physical activity for health, especially for diabetes management.  Also reviewed the MyPlate recommendations for meal planning: decreasing starches and increasing vegetables. Recommended 3 meals/day and to avoid meal skipping.  Recommended limiting sugary beverages like tea.  Teaching Method Utilized:  Visual Auditory  Handouts given during visit include:  Meal plan card  The Plate Method  Barriers to learning/adherence to lifestyle change: previous dietary habits, muscle pain  Diabetes self-care support plan:   [facility] support group   Demonstrated degree of understanding via:  Teach Back   Monitoring/Evaluation:  Dietary intake, exercise, portion control, meal pattern, and body weight in 3 month(s).

## 2014-06-04 NOTE — Patient Instructions (Signed)
Aim for 20 minutes most days for physical activity- walk on your treadmill for maybe 5-10 minutes at a time a couple times a day Follow MyPlate recommendations for meal planning: smaller portion starch, and more vegetables Avoid meal skipping- eat 3 meals each day Switch to unsweet tea with Splenda instead of regular tea

## 2014-09-03 ENCOUNTER — Ambulatory Visit: Payer: Medicare Other | Admitting: *Deleted

## 2015-05-17 ENCOUNTER — Other Ambulatory Visit: Payer: Self-pay | Admitting: Orthopedic Surgery

## 2015-05-17 DIAGNOSIS — M545 Low back pain: Secondary | ICD-10-CM

## 2015-05-17 DIAGNOSIS — R29898 Other symptoms and signs involving the musculoskeletal system: Secondary | ICD-10-CM

## 2015-06-04 ENCOUNTER — Ambulatory Visit
Admission: RE | Admit: 2015-06-04 | Discharge: 2015-06-04 | Disposition: A | Discharge status: Home / Self Care | Payer: Medicare Other | Source: Ambulatory Visit | Attending: Internal Medicine | Admitting: Internal Medicine

## 2015-06-04 ENCOUNTER — Other Ambulatory Visit: Payer: Self-pay | Admitting: Internal Medicine

## 2015-06-04 DIAGNOSIS — R0602 Shortness of breath: Secondary | ICD-10-CM

## 2016-08-27 ENCOUNTER — Inpatient Hospital Stay (HOSPITAL_COMMUNITY): Payer: Medicare Other

## 2016-08-27 ENCOUNTER — Encounter (HOSPITAL_COMMUNITY): Payer: Self-pay | Admitting: Radiology

## 2016-08-27 ENCOUNTER — Inpatient Hospital Stay (HOSPITAL_COMMUNITY)
Admission: EM | Admit: 2016-08-27 | Discharge: 2016-09-15 | DRG: 286 | Disposition: A | Discharge status: Rehabilitation Facility | Payer: Medicare Other | Attending: Internal Medicine | Admitting: Internal Medicine

## 2016-08-27 ENCOUNTER — Emergency Department (HOSPITAL_COMMUNITY): Payer: Medicare Other

## 2016-08-27 DIAGNOSIS — E119 Type 2 diabetes mellitus without complications: Secondary | ICD-10-CM | POA: Diagnosis not present

## 2016-08-27 DIAGNOSIS — E6609 Other obesity due to excess calories: Secondary | ICD-10-CM | POA: Diagnosis present

## 2016-08-27 DIAGNOSIS — I469 Cardiac arrest, cause unspecified: Secondary | ICD-10-CM | POA: Diagnosis not present

## 2016-08-27 DIAGNOSIS — B9689 Other specified bacterial agents as the cause of diseases classified elsewhere: Secondary | ICD-10-CM | POA: Diagnosis present

## 2016-08-27 DIAGNOSIS — R0902 Hypoxemia: Secondary | ICD-10-CM

## 2016-08-27 DIAGNOSIS — I4581 Long QT syndrome: Secondary | ICD-10-CM | POA: Diagnosis present

## 2016-08-27 DIAGNOSIS — G931 Anoxic brain damage, not elsewhere classified: Secondary | ICD-10-CM | POA: Diagnosis present

## 2016-08-27 DIAGNOSIS — I2510 Atherosclerotic heart disease of native coronary artery without angina pectoris: Secondary | ICD-10-CM | POA: Diagnosis not present

## 2016-08-27 DIAGNOSIS — F419 Anxiety disorder, unspecified: Secondary | ICD-10-CM | POA: Diagnosis present

## 2016-08-27 DIAGNOSIS — E669 Obesity, unspecified: Secondary | ICD-10-CM

## 2016-08-27 DIAGNOSIS — E870 Hyperosmolality and hypernatremia: Secondary | ICD-10-CM | POA: Diagnosis not present

## 2016-08-27 DIAGNOSIS — G4733 Obstructive sleep apnea (adult) (pediatric): Secondary | ICD-10-CM | POA: Diagnosis present

## 2016-08-27 DIAGNOSIS — I447 Left bundle-branch block, unspecified: Secondary | ICD-10-CM | POA: Diagnosis present

## 2016-08-27 DIAGNOSIS — R410 Disorientation, unspecified: Secondary | ICD-10-CM | POA: Diagnosis not present

## 2016-08-27 DIAGNOSIS — J9621 Acute and chronic respiratory failure with hypoxia: Secondary | ICD-10-CM | POA: Diagnosis present

## 2016-08-27 DIAGNOSIS — E785 Hyperlipidemia, unspecified: Secondary | ICD-10-CM | POA: Diagnosis present

## 2016-08-27 DIAGNOSIS — D72829 Elevated white blood cell count, unspecified: Secondary | ICD-10-CM

## 2016-08-27 DIAGNOSIS — I4729 Other ventricular tachycardia: Secondary | ICD-10-CM

## 2016-08-27 DIAGNOSIS — J9611 Chronic respiratory failure with hypoxia: Secondary | ICD-10-CM

## 2016-08-27 DIAGNOSIS — E1169 Type 2 diabetes mellitus with other specified complication: Secondary | ICD-10-CM

## 2016-08-27 DIAGNOSIS — I10 Essential (primary) hypertension: Secondary | ICD-10-CM | POA: Diagnosis not present

## 2016-08-27 DIAGNOSIS — R7401 Elevation of levels of liver transaminase levels: Secondary | ICD-10-CM

## 2016-08-27 DIAGNOSIS — R1313 Dysphagia, pharyngeal phase: Secondary | ICD-10-CM | POA: Diagnosis present

## 2016-08-27 DIAGNOSIS — I472 Ventricular tachycardia: Secondary | ICD-10-CM | POA: Diagnosis present

## 2016-08-27 DIAGNOSIS — M109 Gout, unspecified: Secondary | ICD-10-CM | POA: Diagnosis present

## 2016-08-27 DIAGNOSIS — Z6841 Body Mass Index (BMI) 40.0 and over, adult: Secondary | ICD-10-CM | POA: Diagnosis not present

## 2016-08-27 DIAGNOSIS — N179 Acute kidney failure, unspecified: Secondary | ICD-10-CM | POA: Diagnosis present

## 2016-08-27 DIAGNOSIS — E11649 Type 2 diabetes mellitus with hypoglycemia without coma: Secondary | ICD-10-CM | POA: Diagnosis not present

## 2016-08-27 DIAGNOSIS — Z7984 Long term (current) use of oral hypoglycemic drugs: Secondary | ICD-10-CM

## 2016-08-27 DIAGNOSIS — N3000 Acute cystitis without hematuria: Secondary | ICD-10-CM | POA: Diagnosis present

## 2016-08-27 DIAGNOSIS — R7881 Bacteremia: Secondary | ICD-10-CM | POA: Diagnosis present

## 2016-08-27 DIAGNOSIS — R0682 Tachypnea, not elsewhere classified: Secondary | ICD-10-CM

## 2016-08-27 DIAGNOSIS — E876 Hypokalemia: Secondary | ICD-10-CM | POA: Diagnosis not present

## 2016-08-27 DIAGNOSIS — N390 Urinary tract infection, site not specified: Secondary | ICD-10-CM

## 2016-08-27 DIAGNOSIS — E1165 Type 2 diabetes mellitus with hyperglycemia: Secondary | ICD-10-CM | POA: Diagnosis not present

## 2016-08-27 DIAGNOSIS — Z79899 Other long term (current) drug therapy: Secondary | ICD-10-CM

## 2016-08-27 DIAGNOSIS — G9340 Encephalopathy, unspecified: Secondary | ICD-10-CM

## 2016-08-27 DIAGNOSIS — R001 Bradycardia, unspecified: Secondary | ICD-10-CM | POA: Diagnosis present

## 2016-08-27 DIAGNOSIS — I429 Cardiomyopathy, unspecified: Secondary | ICD-10-CM | POA: Diagnosis present

## 2016-08-27 DIAGNOSIS — I5043 Acute on chronic combined systolic (congestive) and diastolic (congestive) heart failure: Secondary | ICD-10-CM

## 2016-08-27 DIAGNOSIS — D62 Acute posthemorrhagic anemia: Secondary | ICD-10-CM

## 2016-08-27 DIAGNOSIS — R509 Fever, unspecified: Secondary | ICD-10-CM

## 2016-08-27 DIAGNOSIS — I5021 Acute systolic (congestive) heart failure: Secondary | ICD-10-CM | POA: Diagnosis not present

## 2016-08-27 DIAGNOSIS — J45909 Unspecified asthma, uncomplicated: Secondary | ICD-10-CM

## 2016-08-27 DIAGNOSIS — I240 Acute coronary thrombosis not resulting in myocardial infarction: Secondary | ICD-10-CM | POA: Diagnosis present

## 2016-08-27 DIAGNOSIS — K5900 Constipation, unspecified: Secondary | ICD-10-CM | POA: Diagnosis present

## 2016-08-27 DIAGNOSIS — I110 Hypertensive heart disease with heart failure: Secondary | ICD-10-CM | POA: Diagnosis present

## 2016-08-27 DIAGNOSIS — Z6835 Body mass index (BMI) 35.0-35.9, adult: Secondary | ICD-10-CM | POA: Diagnosis present

## 2016-08-27 DIAGNOSIS — Z978 Presence of other specified devices: Secondary | ICD-10-CM

## 2016-08-27 DIAGNOSIS — J9601 Acute respiratory failure with hypoxia: Secondary | ICD-10-CM | POA: Diagnosis not present

## 2016-08-27 DIAGNOSIS — R740 Nonspecific elevation of levels of transaminase and lactic acid dehydrogenase [LDH]: Secondary | ICD-10-CM

## 2016-08-27 DIAGNOSIS — R4182 Altered mental status, unspecified: Secondary | ICD-10-CM

## 2016-08-27 DIAGNOSIS — Z9911 Dependence on respirator [ventilator] status: Secondary | ICD-10-CM

## 2016-08-27 DIAGNOSIS — R451 Restlessness and agitation: Secondary | ICD-10-CM | POA: Diagnosis present

## 2016-08-27 DIAGNOSIS — I25118 Atherosclerotic heart disease of native coronary artery with other forms of angina pectoris: Secondary | ICD-10-CM | POA: Diagnosis not present

## 2016-08-27 DIAGNOSIS — Z781 Physical restraint status: Secondary | ICD-10-CM

## 2016-08-27 DIAGNOSIS — J9600 Acute respiratory failure, unspecified whether with hypoxia or hypercapnia: Secondary | ICD-10-CM

## 2016-08-27 HISTORY — DX: Obstructive sleep apnea (adult) (pediatric): G47.33

## 2016-08-27 HISTORY — DX: Dependence on other enabling machines and devices: Z99.89

## 2016-08-27 HISTORY — DX: Type 2 diabetes mellitus without complications: E11.9

## 2016-08-27 HISTORY — DX: Unspecified osteoarthritis, unspecified site: M19.90

## 2016-08-27 HISTORY — DX: Cardiac arrest, cause unspecified: I46.9

## 2016-08-27 HISTORY — DX: Pure hypercholesterolemia, unspecified: E78.00

## 2016-08-27 LAB — POCT I-STAT 3, ART BLOOD GAS (G3+)
Acid-Base Excess: 1 mmol/L (ref 0.0–2.0)
Bicarbonate: 22.3 mmol/L (ref 20.0–28.0)
O2 Saturation: 100 %
Patient temperature: 98.6
TCO2: 23 mmol/L (ref 0–100)
pCO2 arterial: 26.2 mmHg — ABNORMAL LOW (ref 32.0–48.0)
pH, Arterial: 7.538 — ABNORMAL HIGH (ref 7.350–7.450)
pO2, Arterial: 207 mmHg — ABNORMAL HIGH (ref 83.0–108.0)

## 2016-08-27 LAB — BASIC METABOLIC PANEL
Anion gap: 12 (ref 5–15)
Anion gap: 9 (ref 5–15)
Anion gap: 9 (ref 5–15)
Anion gap: 7 (ref 5–15)
BUN: 18 mg/dL (ref 6–20)
BUN: 20 mg/dL (ref 6–20)
BUN: 17 mg/dL (ref 6–20)
BUN: 16 mg/dL (ref 6–20)
CO2: 21 mmol/L — ABNORMAL LOW (ref 22–32)
CO2: 24 mmol/L (ref 22–32)
CO2: 23 mmol/L (ref 22–32)
CO2: 25 mmol/L (ref 22–32)
Calcium: 9.8 mg/dL (ref 8.9–10.3)
Calcium: 9.4 mg/dL (ref 8.9–10.3)
Calcium: 9.5 mg/dL (ref 8.9–10.3)
Calcium: 9.4 mg/dL (ref 8.9–10.3)
Chloride: 106 mmol/L (ref 101–111)
Chloride: 105 mmol/L (ref 101–111)
Chloride: 105 mmol/L (ref 101–111)
Chloride: 107 mmol/L (ref 101–111)
Creatinine, Ser: 1.31 mg/dL — ABNORMAL HIGH (ref 0.61–1.24)
Creatinine, Ser: 1.51 mg/dL — ABNORMAL HIGH (ref 0.61–1.24)
Creatinine, Ser: 1.27 mg/dL — ABNORMAL HIGH (ref 0.61–1.24)
Creatinine, Ser: 1.31 mg/dL — ABNORMAL HIGH (ref 0.61–1.24)
GFR calc Af Amer: >60 mL/min (ref >60)
GFR calc Af Amer: 54 mL/min — ABNORMAL LOW (ref >60)
GFR calc Af Amer: >60 mL/min (ref >60)
GFR calc Af Amer: >60 mL/min (ref >60)
GFR calc non Af Amer: 55 mL/min — ABNORMAL LOW (ref >60)
GFR calc non Af Amer: 46 mL/min — ABNORMAL LOW (ref >60)
GFR calc non Af Amer: 57 mL/min — ABNORMAL LOW (ref >60)
GFR calc non Af Amer: 55 mL/min — ABNORMAL LOW (ref >60)
Glucose, Bld: 157 mg/dL — ABNORMAL HIGH (ref 65–99)
Glucose, Bld: 114 mg/dL — ABNORMAL HIGH (ref 65–99)
Glucose, Bld: 135 mg/dL — ABNORMAL HIGH (ref 65–99)
Glucose, Bld: 126 mg/dL — ABNORMAL HIGH (ref 65–99)
Potassium: 3.6 mmol/L (ref 3.5–5.1)
Potassium: 3.4 mmol/L — ABNORMAL LOW (ref 3.5–5.1)
Potassium: 3.9 mmol/L (ref 3.5–5.1)
Potassium: 4 mmol/L (ref 3.5–5.1)
Sodium: 139 mmol/L (ref 135–145)
Sodium: 138 mmol/L (ref 135–145)
Sodium: 137 mmol/L (ref 135–145)
Sodium: 139 mmol/L (ref 135–145)

## 2016-08-27 LAB — GLUCOSE, CAPILLARY: Glucose-Capillary: 141 mg/dL — ABNORMAL HIGH (ref 65–99)

## 2016-08-27 LAB — CBC
HCT: 39.9 % (ref 39.0–52.0)
Hemoglobin: 12.7 g/dL — ABNORMAL LOW (ref 13.0–17.0)
MCH: 27.1 pg (ref 26.0–34.0)
MCHC: 31.8 g/dL (ref 30.0–36.0)
MCV: 85.3 fL (ref 78.0–100.0)
Platelets: 238 10*3/uL (ref 150–400)
RBC: 4.68 MIL/uL (ref 4.22–5.81)
RDW: 13.6 % (ref 11.5–15.5)
WBC: 22.1 10*3/uL — ABNORMAL HIGH (ref 4.0–10.5)

## 2016-08-27 LAB — URINE MICROSCOPIC-ADD ON

## 2016-08-27 LAB — I-STAT TROPONIN, ED: Troponin i, poc: 0.07 ng/mL (ref 0.00–0.08)

## 2016-08-27 LAB — URINALYSIS, ROUTINE W REFLEX MICROSCOPIC
Bilirubin Urine: NEGATIVE
Glucose, UA: NEGATIVE mg/dL
Hgb urine dipstick: NEGATIVE
Ketones, ur: NEGATIVE mg/dL
Nitrite: POSITIVE — AB
Protein, ur: NEGATIVE mg/dL
Specific Gravity, Urine: 1.018 (ref 1.005–1.030)
pH: 6 (ref 5.0–8.0)

## 2016-08-27 LAB — LIPASE, BLOOD: Lipase: 16 U/L (ref 11–51)

## 2016-08-27 LAB — PROCALCITONIN: Procalcitonin: 1.26 ng/mL

## 2016-08-27 LAB — I-STAT ARTERIAL BLOOD GAS, ED
Acid-base deficit: 5 mmol/L — ABNORMAL HIGH (ref 0.0–2.0)
Bicarbonate: 23.1 mmol/L (ref 20.0–28.0)
O2 Saturation: 32 %
TCO2: 25 mmol/L (ref 0–100)
pCO2 arterial: 55.4 mmHg — ABNORMAL HIGH (ref 32.0–48.0)
pH, Arterial: 7.229 — ABNORMAL LOW (ref 7.350–7.450)
pO2, Arterial: 24 mmHg — CL (ref 83.0–108.0)

## 2016-08-27 LAB — I-STAT CG4 LACTIC ACID, ED: Lactic Acid, Venous: 5.88 mmol/L (ref 0.5–1.9)

## 2016-08-27 LAB — TROPONIN I
Troponin I: 1.54 ng/mL (ref <0.03)
Troponin I: 2.61 ng/mL (ref <0.03)

## 2016-08-27 LAB — APTT: aPTT: 26 seconds (ref 24–36)

## 2016-08-27 LAB — MRSA PCR SCREENING: MRSA by PCR: NEGATIVE

## 2016-08-27 LAB — PROTIME-INR
INR: 1.13
Prothrombin Time: 14.6 seconds (ref 11.4–15.2)

## 2016-08-27 MED ORDER — MIDAZOLAM HCL 2 MG/2ML IJ SOLN
INTRAMUSCULAR | Status: AC
  Administered 2016-08-27: 2 mg via INTRAVENOUS
  Filled 2016-08-27: qty 2

## 2016-08-27 MED ORDER — VANCOMYCIN HCL 10 G IV SOLR
2500.0000 mg | Freq: Once | INTRAVENOUS | Status: DC
  Filled 2016-08-27: qty 2500

## 2016-08-27 MED ORDER — PROPOFOL 1000 MG/100ML IV EMUL
5.0000 ug/kg/min | Freq: Once | INTRAVENOUS | Status: AC
  Administered 2016-08-27: 5 ug/kg/min via INTRAVENOUS
  Filled 2016-08-27: qty 100

## 2016-08-27 MED ORDER — MIDAZOLAM HCL 2 MG/2ML IJ SOLN
2.0000 mg | Freq: Once | INTRAMUSCULAR | Status: AC
  Administered 2016-08-27: 2 mg via INTRAVENOUS

## 2016-08-27 MED ORDER — SODIUM CHLORIDE 0.9 % IV SOLN
2.0000 mg/h | INTRAVENOUS | Status: DC
  Administered 2016-08-27: 2 mg/h via INTRAVENOUS
  Administered 2016-08-27 – 2016-08-28 (×3): 5 mg/h via INTRAVENOUS
  Filled 2016-08-27 (×5): qty 10

## 2016-08-27 MED ORDER — IOPAMIDOL (ISOVUE-370) INJECTION 76%
INTRAVENOUS | Status: AC
  Administered 2016-08-27: 100 mL
  Filled 2016-08-27: qty 100

## 2016-08-27 MED ORDER — ORAL CARE MOUTH RINSE
15.0000 mL | OROMUCOSAL | Status: DC
  Administered 2016-08-27 – 2016-08-31 (×41): 15 mL via OROMUCOSAL

## 2016-08-27 MED ORDER — NOREPINEPHRINE BITARTRATE 1 MG/ML IV SOLN
0.0000 ug/min | INTRAVENOUS | Status: DC
  Administered 2016-08-27: 10 ug/min via INTRAVENOUS
  Filled 2016-08-27 (×2): qty 4

## 2016-08-27 MED ORDER — HEPARIN SODIUM (PORCINE) 5000 UNIT/ML IJ SOLN
5000.0000 [IU] | Freq: Three times a day (TID) | INTRAMUSCULAR | Status: DC
  Administered 2016-08-27 – 2016-08-28 (×2): 5000 [IU] via SUBCUTANEOUS
  Filled 2016-08-27 (×2): qty 1

## 2016-08-27 MED ORDER — CHLORHEXIDINE GLUCONATE 0.12% ORAL RINSE (MEDLINE KIT)
15.0000 mL | Freq: Two times a day (BID) | OROMUCOSAL | Status: DC
  Administered 2016-08-27 – 2016-08-31 (×9): 15 mL via OROMUCOSAL

## 2016-08-27 MED ORDER — SODIUM CHLORIDE 0.9 % IV SOLN
INTRAVENOUS | Status: DC
Start: 2016-08-27 — End: 2016-08-29
  Administered 2016-08-27 – 2016-08-29 (×3): via INTRAVENOUS

## 2016-08-27 MED ORDER — FAMOTIDINE IN NACL 20-0.9 MG/50ML-% IV SOLN
20.0000 mg | Freq: Two times a day (BID) | INTRAVENOUS | Status: DC
  Administered 2016-08-27 – 2016-09-06 (×20): 20 mg via INTRAVENOUS
  Filled 2016-08-27 (×21): qty 50

## 2016-08-27 MED ORDER — FENTANYL CITRATE (PF) 100 MCG/2ML IJ SOLN: 50.0000 ug | Freq: Once | INTRAMUSCULAR | Status: DC

## 2016-08-27 MED ORDER — ETOMIDATE 2 MG/ML IV SOLN
20.0000 mg | Freq: Once | INTRAVENOUS | Status: AC
Start: 2016-08-27 — End: 2016-08-27
  Administered 2016-08-27: 20 mg via INTRAVENOUS

## 2016-08-27 MED ORDER — FENTANYL 2500MCG IN NS 250ML (10MCG/ML) PREMIX INFUSION
100.0000 ug/h | INTRAVENOUS | Status: DC
  Administered 2016-08-27: 50 ug/h via INTRAVENOUS
  Administered 2016-08-28 (×2): 200 ug/h via INTRAVENOUS
  Administered 2016-08-30: 150 ug/h via INTRAVENOUS
  Filled 2016-08-27 (×4): qty 250

## 2016-08-27 MED ORDER — SODIUM CHLORIDE 0.9 % IV SOLN: 1750.0000 mg | INTRAVENOUS | Status: DC

## 2016-08-27 MED ORDER — SODIUM CHLORIDE 0.9% FLUSH
10.0000 mL | Freq: Two times a day (BID) | INTRAVENOUS | Status: DC
  Administered 2016-08-27: 10 mL
  Administered 2016-08-28: 20 mL
  Administered 2016-08-28: 30 mL
  Administered 2016-08-29 – 2016-09-02 (×7): 10 mL

## 2016-08-27 MED ORDER — ETOMIDATE 2 MG/ML IV SOLN
INTRAVENOUS | Status: AC
  Administered 2016-08-27: 20 mg via INTRAVENOUS
  Filled 2016-08-27: qty 10

## 2016-08-27 MED ORDER — CISATRACURIUM BOLUS VIA INFUSION
0.1000 mg/kg | Freq: Once | INTRAVENOUS | Status: AC
  Administered 2016-08-27: 14.1 mg via INTRAVENOUS
  Filled 2016-08-27: qty 15

## 2016-08-27 MED ORDER — ORAL CARE MOUTH RINSE: 15.0000 mL | OROMUCOSAL | Status: DC

## 2016-08-27 MED ORDER — MIDAZOLAM BOLUS VIA INFUSION
1.0000 mg | INTRAVENOUS | Status: DC | PRN
  Filled 2016-08-27: qty 1

## 2016-08-27 MED ORDER — SUCCINYLCHOLINE CHLORIDE 20 MG/ML IJ SOLN
120.0000 mg | Freq: Once | INTRAMUSCULAR | Status: AC
  Administered 2016-08-27: 120 mg via INTRAVENOUS

## 2016-08-27 MED ORDER — SODIUM CHLORIDE 0.9 % IV BOLUS (SEPSIS)
1000.0000 mL | Freq: Once | INTRAVENOUS | Status: AC
  Administered 2016-08-27: 1000 mL via INTRAVENOUS

## 2016-08-27 MED ORDER — INSULIN ASPART 100 UNIT/ML ~~LOC~~ SOLN
0.0000 [IU] | SUBCUTANEOUS | Status: DC
  Administered 2016-08-27 – 2016-09-06 (×8): 2 [IU] via SUBCUTANEOUS

## 2016-08-27 MED ORDER — MIDAZOLAM HCL 2 MG/2ML IJ SOLN
4.0000 mg | Freq: Once | INTRAMUSCULAR | Status: AC
  Administered 2016-08-27: 4 mg via INTRAVENOUS

## 2016-08-27 MED ORDER — FENTANYL BOLUS VIA INFUSION
25.0000 ug | INTRAVENOUS | Status: DC | PRN
  Filled 2016-08-27: qty 25

## 2016-08-27 MED ORDER — CHLORHEXIDINE GLUCONATE 0.12% ORAL RINSE (MEDLINE KIT): 15.0000 mL | Freq: Two times a day (BID) | OROMUCOSAL | Status: DC

## 2016-08-27 MED ORDER — FENTANYL CITRATE (PF) 100 MCG/2ML IJ SOLN
INTRAMUSCULAR | Status: AC
  Administered 2016-08-27: 100 ug
  Filled 2016-08-27: qty 2

## 2016-08-27 MED ORDER — PIPERACILLIN-TAZOBACTAM 3.375 G IVPB 30 MIN
3.3750 g | Freq: Once | INTRAVENOUS | Status: DC
  Filled 2016-08-27: qty 50

## 2016-08-27 MED ORDER — CISATRACURIUM BOLUS VIA INFUSION
0.0500 mg/kg | INTRAVENOUS | Status: DC | PRN
  Filled 2016-08-27: qty 7

## 2016-08-27 MED ORDER — SODIUM CHLORIDE 0.9 % IV SOLN
1.0000 ug/kg/min | INTRAVENOUS | Status: DC
  Administered 2016-08-27: 1 ug/kg/min via INTRAVENOUS
  Filled 2016-08-27: qty 20

## 2016-08-27 MED ORDER — MIDAZOLAM HCL 2 MG/2ML IJ SOLN
1.0000 mg | Freq: Once | INTRAMUSCULAR | Status: AC
  Administered 2016-08-27: 1 mg via INTRAVENOUS
  Filled 2016-08-27: qty 2

## 2016-08-27 MED ORDER — ARTIFICIAL TEARS OP OINT
1.0000 "application " | TOPICAL_OINTMENT | Freq: Three times a day (TID) | OPHTHALMIC | Status: DC
  Administered 2016-08-27: 1 via OPHTHALMIC
  Filled 2016-08-27: qty 3.5

## 2016-08-27 MED ORDER — MIDAZOLAM HCL 2 MG/2ML IJ SOLN
INTRAMUSCULAR | Status: AC
  Administered 2016-08-27: 4 mg via INTRAVENOUS
  Filled 2016-08-27: qty 4

## 2016-08-27 MED ORDER — SODIUM CHLORIDE 0.9% FLUSH: 10.0000 mL | INTRAVENOUS | Status: DC | PRN

## 2016-08-27 MED ORDER — FENTANYL CITRATE (PF) 100 MCG/2ML IJ SOLN
INTRAMUSCULAR | Status: AC
  Filled 2016-08-27: qty 2

## 2016-08-27 MED ORDER — SODIUM CHLORIDE 0.9 % IV SOLN
30.0000 meq | Freq: Once | INTRAVENOUS | Status: AC
  Administered 2016-08-27: 30 meq via INTRAVENOUS
  Filled 2016-08-27: qty 15

## 2016-08-27 MED ORDER — ASPIRIN 300 MG RE SUPP
300.0000 mg | RECTAL | Status: AC
  Administered 2016-08-27: 300 mg via RECTAL
  Filled 2016-08-27: qty 1

## 2016-08-27 MED ORDER — PIPERACILLIN-TAZOBACTAM 3.375 G IVPB
3.3750 g | Freq: Three times a day (TID) | INTRAVENOUS | Status: DC
  Filled 2016-08-27 (×2): qty 50

## 2016-08-27 MED ORDER — FENTANYL CITRATE (PF) 100 MCG/2ML IJ SOLN
50.0000 ug | Freq: Once | INTRAMUSCULAR | Status: AC
  Administered 2016-08-27: 50 ug via INTRAVENOUS

## 2016-08-27 NOTE — Procedures (Signed)
Central Venous Catheter Insertion Procedure Note Dean [name] [id number] 1950/12/25  Procedure: Insertion of Central Venous Catheter Indications: Assessment of intravascular volume, Drug and/or fluid administration and Frequent blood sampling  Procedure Details Consent: Unable to obtain consent because of emergent medical necessity. Time Out: Verified patient identification, verified procedure, site/side was marked, verified correct patient position, special equipment/implants available, medications/allergies/relevent history reviewed, required imaging and test results available.  Performed  Maximum sterile technique was used including antiseptics, cap, gloves, gown, hand hygiene, mask and sheet. Skin prep: Chlorhexidine; local anesthetic administered A antimicrobial bonded/coated triple lumen catheter was placed in the left internal jugular vein using the Seldinger technique. Ultrasound guidance used.Yes.   Catheter placed to 20 cm. Blood aspirated via all 3 ports and then flushed x 3. Line sutured x 2 and dressing applied.  Evaluation Blood flow good Complications: No apparent complications Patient did tolerate procedure well. Chest X-ray ordered to verify placement.  CXR: pending.  Dean [name] Minor ACNP Dean [name] PCCM Pager [phone] till 3 pm If no answer page [phone] 08/27/2016, 3:04 PM  Rush Farmer, M.D. [facility] Pulmonary/Critical Care Medicine. Pager: [phone]. After hours pager: [phone].

## 2016-08-27 NOTE — Progress Notes (Signed)
Pt arrived from ED. [place] pads in place, cooling started at 1615. Ice packs placed in groin and axilla. Dr Emmit Alexanders aware that cooling unlikely to occur within 2 hours.

## 2016-08-27 NOTE — Progress Notes (Signed)
Upon assessment, patient posturing. Repeat Chest Xray complete. Tamala Julian, MD called and notified. ET tube in correct position. MD aware of posturing.

## 2016-08-27 NOTE — Progress Notes (Signed)
Notified Dr Tamala Julian of RT difficulty inserting A-line and pt current HR in 40's while core temp 37.7. Goal temp changed to 36.

## 2016-08-27 NOTE — ED Provider Notes (Signed)
[facility] DEPT Provider Note   CSN: [id number] Arrival date & time: 08/27/16  1208  By signing my name below, I, Gwenlyn Fudge, attest that this documentation has been prepared under the direction and in the presence of Rhyse Loux Julio Alm, MD. Electronically Signed: Gwenlyn Fudge, ED Scribe. 08/27/16. 1:44 PM.   History   Chief Complaint Chief Complaint  Patient presents with  . Cardiac Arrest   The history is provided by the EMS personnel. No language interpreter was used.   LEVEL 5 CAVEAT: HPI and ROS limited due to ACUITY OF CONDITION  HPI Comments: Dean [name] is a 65 y.o. male with PMHx of DM, Asthma, and HTN who presents to the Emergency Department via EMS after a witnessed cardiac arrest. After ambulating to the bathroom, pt returned and suddenly collapsed. Pt's family was unable to perform CPR, but EMS arrived and performed CPR from 11:19 AM to 11:42 AM. Pt received [facility] airway, 5x cardioversion, 450 mg Amiodarone, 2 gm Magnesium, and 7x Epinephrine injections. Pt rearrested and cardioversion was performed again with return of spontaneous circulation.    Per EMS, pt had a consistent blood pressure of 150/70 and had a blood sugar level of 130. Pt was experiencing respiratory problems leading up to cardiac arrest. Pt had been coughing at home and used some cough syrup to relief symptoms.  Past Medical History:  Diagnosis Date  . Asthma   . Diabetes mellitus without complication   . Gout   . Hypertension     Patient Active Problem List   Diagnosis Date Noted  . Type II or unspecified type diabetes mellitus without mention of complication, not stated as uncontrolled 03/02/2014  . Obesity, unspecified 03/02/2014  . Essential hypertension, benign 03/02/2014    No past surgical history on file.     Home Medications    Prior to Admission medications   Medication Sig Start Date End Date Taking? Authorizing Provider  allopurinol (ZYLOPRIM) 100 MG tablet Take  100 mg by mouth daily.    Historical Provider, MD  amLODipine-olmesartan (AZOR) 10-40 MG per tablet Take 1 tablet by mouth daily.    Historical Provider, MD  cholecalciferol (VITAMIN D) 1000 UNITS tablet Take 1,000 Units by mouth daily.    Historical Provider, MD  furosemide (LASIX) 40 MG tablet Take 40 mg by mouth daily.    Historical Provider, MD  Ipratropium-Albuterol (COMBIVENT RESPIMAT) 20-100 MCG/ACT AERS respimat Inhale 1 puff into the lungs every 6 (six) hours as needed for wheezing or shortness of breath.    Historical Provider, MD  magnesium gluconate (MAGONATE) 500 MG tablet Take 250 mg by mouth daily.    Historical Provider, MD  metFORMIN (GLUCOPHAGE) 1000 MG tablet Take 500 mg by mouth 2 (two) times daily with a meal.     Historical Provider, MD  metoprolol (TOPROL-XL) 200 MG 24 hr tablet Take 300 mg by mouth daily.    Historical Provider, MD  rosuvastatin (CRESTOR) 10 MG tablet Take 10 mg by mouth daily.    Historical Provider, MD    Family History No family history on file.  Social History Social History  Substance Use Topics  . Smoking status: Never Smoker  . Smokeless tobacco: Not on file  . Alcohol use No     Allergies   Patient has no known allergies.   Review of Systems Review of Systems  Unable to perform ROS: Acuity of condition   Physical Exam Updated Vital Signs BP (!) 96/43   Pulse 73  Resp (!) 8   Ht 5\' 5"  (1.651 m)   SpO2 100%   Physical Exam  Constitutional: He appears well-developed and well-nourished.  Pt is an obese [place] American male that is not responsive to external stimuli  Eyes:  Pupils are 2 mm  Pulmonary/Chest: He is in respiratory distress.  Neurological: He is unresponsive.  Skin: Skin is warm and dry.  Nursing note and vitals reviewed.  ED Treatments / Results  DIAGNOSTIC STUDIES: Oxygen Saturation is 100% on RA, normal by my interpretation.    Labs (all labs ordered are listed, but only abnormal results are  displayed) Labs Reviewed  BASIC METABOLIC PANEL  CBC  I-STAT [place], ED    EKG  EKG Interpretation  Date/Time:  Sunday August 27 2016 12:22:35 EST Ventricular Rate:  103 PR Interval:    QRS Duration: 227 QT Interval:  423 QTC Calculation: [id number] R Axis:   -5 Text Interpretation:  Sinus tachycardia Ventricular tachycardia, unsustained Consider left atrial enlargement Nonspecific intraventricular conduction delay Inferior infarct, age indeterminate Anterior infarct, old Artifact in lead(s) I III aVR aVL V2 V3 V5 V6 and baseline wander in lead(s) I II III aVR aVL aVF V1 V2 V3 V4 V5 V6 DISREGARD, UNREADABLE Confirmed by LITTLE MD, RACHEL [id number]) on 08/27/2016 12:31:18 PM       Radiology No results found.  Procedures Procedure Name: Intubation Date/Time: 08/27/2016 6:25 PM Performed by: Zenovia Jarred LYN Oxygen Delivery Method: Ambu bag Preoxygenation: Pre-oxygenation with 100% oxygen Intubation Type: Rapid sequence Laryngoscope Size: Mac, 4 and Glidescope Grade View: Grade III Tube size: 8.0 mm Number of attempts: 1 Intubation method: poisitioned with rolls under shoulders. Placement Confirmation: ETT inserted through vocal cords under direct vision,  Positive ETCO2,  CO2 detector and Breath sounds checked- equal and bilateral Tube secured with: ETT holder Dental Injury: Teeth and Oropharynx as per pre-operative assessment       (including critical care time)  Medications Ordered in ED Medications  fentaNYL (SUBLIMAZE) injection 50 mcg (not administered)  etomidate (AMIDATE) injection 20 mg (20 mg Intravenous Given by Other 08/27/16 1225)  succinylcholine (ANECTINE) injection 120 mg (120 mg Intravenous Given 08/27/16 1228)  midazolam (VERSED) injection 4 mg (4 mg Intravenous Given by Other 08/27/16 1226)  fentaNYL (SUBLIMAZE) 100 MCG/2ML injection (100 mcg  Given by Other 08/27/16 1230)  midazolam (VERSED) injection 2 mg (2 mg Intravenous Given by Other 08/27/16 1239)      Initial Impression / Assessment and Plan / ED Course  I have reviewed the triage vital signs and the nursing notes.  Pertinent labs & imaging results that were available during my care of the patient were reviewed by me and considered in my medical decision making (see chart for details).  Clinical Course as of Aug 28 1343  Sun Aug 27, 2016  1338 Consulted with Cardiology and discussed case with them. They do not wish to take any actions at this time.  [MG]    Clinical Course User Index [MG] Gwenlyn Fudge   I personally performed the services described in this documentation, which was scribed in my presence. The recorded information has been reviewed and is accurate.   Patient is a 65 year old male with past medical history significant for attention asthma gout and morbid obesity. Patient's presenting today after return of spontaneous circulation after CPR. CPR was done for 22 minutes by EMS. No CPR was done between the call and EMS. According to family patient returned from the bathroom and then all of a  sudden stopped breathing and slept on the couch. No prodrome.  On arrival he shouldn't intubate with [facility] airway breathing over the tube. Airway was placed. Patient had difficulty with IV access X 2. Patient's EKG does not show any definitive ischemia.  We discussed case with cardiology since patient had V. fib arrest with multiple shocks.  Cardiology does not wish to pursue anything at this time.   Unsure what the inciting factors. However patient's vitals of been relatively stable. Patient is not making any purposeful movement.   Emergency Ultrasound:  US Guidance for needle guidance  Performed by Dr. Thomasene Lot Indication: IV access Linear probe used in real-time to visualize location of needle entry through skin. Interpretation: IV inserted into peripheral vein X 2 on left arm Images not archived electronically.   CRITICAL CARE Performed by: Gardiner Sleeper Total  critical care time: 60 minutes Critical care time was exclusive of separately billable procedures and treating other patients. Critical care was necessary to treat or prevent imminent or life-threatening deterioration. Critical care was time spent personally by me on the following activities: development of treatment plan with patient and/or surrogate as well as nursing, discussions with consultants, evaluation of patient's response to treatment, examination of patient, obtaining history from patient or surrogate, ordering and performing treatments and interventions, ordering and review of laboratory studies, ordering and review of radiographic studies, pulse oximetry and re-evaluation of patient's condition.  .  Final Clinical Impressions(s) / ED Diagnoses   Final diagnoses:  None    New Prescriptions New Prescriptions   No medications on file     Jolanta Cabeza Julio Alm, MD 08/27/16 1826

## 2016-08-27 NOTE — Progress Notes (Signed)
eLink Physician-Brief Progress Note Patient Name: Crescencio Marku DOB: 1950-10-08 MRN: [id number]   Date of Service  08/27/2016  HPI/Events of Note  ETT noted to be deep on last cxr and on CT.  eICU Interventions  Pull back 3 cm and repeat cxr     Intervention Category Minor Interventions: OtherMauri Brooklyn, Mamie Nick 08/27/2016, 7:57 PM

## 2016-08-27 NOTE — Progress Notes (Signed)
eLink Physician-Brief Progress Note Patient Name: Dean [name] DOB: Dec 21, 1950 MRN: [id number]   Date of Service  08/27/2016  HPI/Events of Note  Goal temperature changed to 36c as patient was developing significant bradycardia with attempts to drive temperature lower.  eICU Interventions  Goal temp for hypothermia protocol 36     Intervention Category Intermediate Interventions: Other:  Mauri [place], Mamie Nick 08/27/2016, 9:21 PM

## 2016-08-27 NOTE — Consult Note (Signed)
Admission H&P    Chief Complaint: anoxic brain injury and possible seizures.  HPI: Dean [name] is an 65 y.o. male with a history of hypertension, gout and diabetes mellitus, brought to the ED following an acute cardiac arrest at home. Resuscitation time was approximately 21 minutes. CT scan of the head showed no acute abnormality. Patient was intubated and cooling protocol has been initiated. Neurology consultation was obtain to assess severity of encephalopathy, as well as rule out possible seizure activity. Patient was noted to have spontaneous eye movements described as slow roving movements from right to left. No rhythmic rapid eye movements were noted. No tonic-clonic activity was reported.  Past Medical History:  Diagnosis Date  . Asthma   . Diabetes mellitus without complication ([place])   . Gout   . Hypertension     No past surgical history on file.  No family history on file. Social History:  reports that he has never smoked. He does not have any smokeless tobacco history on file. He reports that he does not drink alcohol or use drugs.  Allergies: No Known Allergies  Medications Prior to Admission  Medication Sig Dispense Refill  . allopurinol (ZYLOPRIM) 300 MG tablet Take 300 mg by mouth daily.    [name] amLODipine (NORVASC) 10 MG tablet Take 10 mg by mouth daily.    . cloNIDine (CATAPRES) 0.2 MG tablet Take 0.2 mg by mouth daily.    . [employer] VITAMIN D3 1000 units capsule Take 1,000 Units by mouth daily.  5  . furosemide (LASIX) 40 MG tablet Take 40 mg by mouth daily.    . Ipratropium-Albuterol (COMBIVENT RESPIMAT) 20-100 MCG/ACT AERS respimat Inhale 1 puff into the lungs every 6 (six) hours as needed for wheezing or shortness of breath.    . isosorbide-hydrALAZINE (BIDIL) 20-37.5 MG tablet Take 1 tablet by mouth 3 (three) times daily.    . metFORMIN (GLUCOPHAGE) 500 MG tablet Take 500 mg by mouth 2 (two) times daily with a meal.     . metoprolol (TOPROL-XL) 200 MG 24 hr tablet  Take 300 mg by mouth daily.    [name] PRESCRIPTION MEDICATION Inhale into the lungs at bedtime. CPAP    . rosuvastatin (CRESTOR) 20 MG tablet Take 20 mg by mouth daily.    [name] spironolactone (ALDACTONE) 50 MG tablet Take 50 mg by mouth daily.      ROS: Unavailable due to patient's unresponsive state.  Physical Examination: Blood pressure (!) 176/91, pulse 67, temperature (!) 95.5 F (35.3 C), resp. rate 18, height '5\' 5"'$  (1.651 m), weight (!) 140.6 kg (310 lb), SpO2 96 %.  HEENT-  Normocephalic, no lesions, without obvious abnormality.  Normal external eye and conjunctiva.  Normal TM's bilaterally.  Normal auditory canals and external ears. Normal external nose, mucus membranes and septum.  Normal pharynx. Neck supple with no masses, nodes, nodules or enlargement. Cardiovascular - regular rate and rhythm, S1, S2 normal, no murmur, click, rub or gallop Lungs - chest clear, no wheezing, rales, normal symmetric air entry Abdomen - soft, non-tender; bowel sounds normal; no masses,  no organomegaly Extremities - no joint deformities, effusion, or inflammation  Neurologic Examination: Patient was intubated and on mechanical ventilation. No spontaneous respirations were noted. He was sedated with fentanyl and Versed, and was also on Nimbex drip. Pupils were equal and normal in size and did not react to light. Extraocular movements are absent with oculocephalic maneuvers. Face was symmetrical with no focal weakness. Muscle tone was flaccid throughout. There was  no abnormal posturing, and no spontaneous movements were noted. Deep tendon reflexes were 2+ and symmetrical. Plantar responses were mute.  Results for orders placed or performed during the hospital encounter of 08/27/16 (from the past 48 hour(s))  I-Stat arterial blood gas, ED     Status: Abnormal   Collection Time: 08/27/16  1:03 PM  Result Value Ref Range   pH, Arterial 7.229 (L) 7.350 - 7.450   pCO2 arterial 55.4 (H) 32.0 - 48.0 mmHg    pO2, Arterial 24.0 (LL) 83.0 - 108.0 mmHg   Bicarbonate 23.1 20.0 - 28.0 mmol/L   TCO2 25 0 - 100 mmol/L   O2 Saturation 32.0 %   Acid-base deficit 5.0 (H) 0.0 - 2.0 mmol/L   Patient temperature HIDE    Collection site BRACHIAL ARTERY    Drawn by Operator    Sample type ARTERIAL    Comment NOTIFIED PHYSICIAN   Urinalysis, Routine w reflex microscopic (not at [facility])     Status: Abnormal   Collection Time: 08/27/16  1:07 PM  Result Value Ref Range   Color, Urine YELLOW YELLOW   APPearance CLEAR CLEAR   Specific Gravity, Urine 1.018 1.005 - 1.030   pH 6.0 5.0 - 8.0   Glucose, UA NEGATIVE NEGATIVE mg/dL   Hgb urine dipstick NEGATIVE NEGATIVE   Bilirubin Urine NEGATIVE NEGATIVE   Ketones, ur NEGATIVE NEGATIVE mg/dL   Protein, ur NEGATIVE NEGATIVE mg/dL   Nitrite POSITIVE (A) NEGATIVE   Leukocytes, UA TRACE (A) NEGATIVE  Urine microscopic-add on     Status: Abnormal   Collection Time: 08/27/16  1:07 PM  Result Value Ref Range   Squamous Epithelial / LPF 0-5 (A) NONE SEEN   WBC, UA 0-5 0 - 5 WBC/hpf   RBC / HPF 0-5 0 - 5 RBC/hpf   Bacteria, UA RARE (A) NONE SEEN  Basic metabolic panel     Status: Abnormal   Collection Time: 08/27/16  1:12 PM  Result Value Ref Range   Sodium 139 135 - 145 mmol/L   Potassium 3.6 3.5 - 5.1 mmol/L   Chloride 106 101 - 111 mmol/L   CO2 21 (L) 22 - 32 mmol/L   Glucose, Bld 157 (H) 65 - 99 mg/dL   BUN 18 6 - 20 mg/dL   Creatinine, Ser 1.31 (H) 0.61 - 1.24 mg/dL   Calcium 9.8 8.9 - 10.3 mg/dL   GFR calc non Af Amer 55 (L) >60 mL/min   GFR calc Af Amer >60 >60 mL/min    Comment: (NOTE) The eGFR has been calculated using the CKD EPI equation. This calculation has not been validated in all clinical situations. eGFR's persistently <60 mL/min signify possible Chronic Kidney Disease.    Anion gap 12 5 - 15  CBC     Status: Abnormal   Collection Time: 08/27/16  1:12 PM  Result Value Ref Range   WBC 22.1 (H) 4.0 - 10.5 K/uL   RBC 4.68 4.22 - 5.81  MIL/uL   Hemoglobin 12.7 (L) 13.0 - 17.0 g/dL   HCT 39.9 39.0 - 52.0 %   MCV 85.3 78.0 - 100.0 fL   MCH 27.1 26.0 - 34.0 pg   MCHC 31.8 30.0 - 36.0 g/dL   RDW 13.6 11.5 - 15.5 %   Platelets 238 150 - 400 K/uL  Lipase, blood     Status: None   Collection Time: 08/27/16  1:12 PM  Result Value Ref Range   Lipase 16 11 - 51 U/L  I-stat troponin, ED     Status: None   Collection Time: 08/27/16  1:24 PM  Result Value Ref Range   Troponin i, poc 0.07 0.00 - 0.08 ng/mL   Comment 3            Comment: Due to the release kinetics of cTnI, a negative result within the first hours of the onset of symptoms does not rule out myocardial infarction with certainty. If myocardial infarction is still suspected, repeat the test at appropriate intervals.   I-Stat CG4 Lactic Acid, ED     Status: Abnormal   Collection Time: 08/27/16  1:26 PM  Result Value Ref Range   Lactic Acid, Venous 5.88 (HH) 0.5 - 1.9 mmol/L   Comment NOTIFIED PHYSICIAN   MRSA PCR Screening     Status: None   Collection Time: 08/27/16  4:35 PM  Result Value Ref Range   MRSA by PCR NEGATIVE NEGATIVE    Comment:        The GeneXpert MRSA Assay (FDA approved for NASAL specimens only), is one component of a comprehensive MRSA colonization surveillance program. It is not intended to diagnose MRSA infection nor to guide or monitor treatment for MRSA infections.   Troponin I     Status: Abnormal   Collection Time: 08/27/16  5:05 PM  Result Value Ref Range   Troponin I 1.54 (HH) <0.03 ng/mL    Comment: CRITICAL RESULT CALLED TO, READ BACK BY AND VERIFIED WITH: POTTERLRN [id number] MCCAULEG   Protime-INR now and repeat in 8 hours     Status: None   Collection Time: 08/27/16  5:05 PM  Result Value Ref Range   Prothrombin Time 14.6 11.4 - 15.2 seconds   INR 1.13   APTT now and repeat in 8 hours     Status: None   Collection Time: 08/27/16  5:05 PM  Result Value Ref Range   aPTT 26 24 - 36 seconds  Basic metabolic  panel     Status: Abnormal   Collection Time: 08/27/16  5:06 PM  Result Value Ref Range   Sodium 138 135 - 145 mmol/L   Potassium 3.4 (L) 3.5 - 5.1 mmol/L   Chloride 105 101 - 111 mmol/L   CO2 24 22 - 32 mmol/L   Glucose, Bld 114 (H) 65 - 99 mg/dL   BUN 20 6 - 20 mg/dL   Creatinine, Ser 1.51 (H) 0.61 - 1.24 mg/dL   Calcium 9.4 8.9 - 10.3 mg/dL   GFR calc non Af Amer 46 (L) >60 mL/min   GFR calc Af Amer 54 (L) >60 mL/min    Comment: (NOTE) The eGFR has been calculated using the CKD EPI equation. This calculation has not been validated in all clinical situations. eGFR's persistently <60 mL/min signify possible Chronic Kidney Disease.    Anion gap 9 5 - 15  I-STAT 3, arterial blood gas (G3+)     Status: Abnormal   Collection Time: 08/27/16  5:32 PM  Result Value Ref Range   pH, Arterial 7.538 (H) 7.350 - 7.450   pCO2 arterial 26.2 (L) 32.0 - 48.0 mmHg   pO2, Arterial 207.0 (H) 83.0 - 108.0 mmHg   Bicarbonate 22.3 20.0 - 28.0 mmol/L   TCO2 23 0 - 100 mmol/L   O2 Saturation 100.0 %   Acid-Base Excess 1.0 0.0 - 2.0 mmol/L   Patient temperature 98.6 F    Collection site RADIAL, ALLEN'S TEST ACCEPTABLE    Drawn by Mining engineer  Sample type ARTERIAL   Glucose, capillary     Status: Abnormal   Collection Time: 08/27/16  8:33 PM  Result Value Ref Range   Glucose-Capillary 141 (H) 65 - 99 mg/dL   Ct Head Wo Contrast  Result Date: 08/27/2016 CLINICAL DATA:  POST CPRwitnessed cardiac arrest. After ambulating to the bathroom, pt returned and suddenly collapsed. Pt's family was unable to perform CPR, but EMS arrived and performed CPR from 11:19 AM to 11:42 AM. EXAM: CT HEAD WITHOUT CONTRAST TECHNIQUE: Contiguous axial images were obtained from the base of the skull through the vertex without intravenous contrast. COMPARISON:  03/29/2005 FINDINGS: Brain: No evidence of acute infarction, hemorrhage, hydrocephalus, extra-axial collection or mass lesion/mass effect. The ventricles are normal in  configuration. 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 No hydrocephalus. There is mild patchy white matter hypoattenuation consistent with chronic microvascular ischemic change. Vascular: No hyperdense vessel or unexpected calcification. Skull: Normal. Negative for fracture or focal lesion. Sinuses/Orbits: Normal globes and orbits. Visualized sinuses and mastoid air cells are clear. Other: None. IMPRESSION: 1. No acute intracranial abnormalities. 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 Electronically Signed   By: Lajean Manes M.D.   On: 08/27/2016 16:15   Ct Angio Chest Pe W And/or Wo Contrast  Addendum Date: 08/27/2016   ADDENDUM REPORT: 08/27/2016 19:56 ADDENDUM: Endotracheal tube terminates in the right mainstem bronchus, as dictated in the original report. Retracting approximately 3-4 cm should better position the tip above the carina. Critical Value/emergent results were called by telephone at the time of interpretation on 08/27/2016 at 7:55 pm to [facility], who verbally acknowledged these results. Electronically Signed   By: Lorin Picket M.D.   On: 08/27/2016 19:56   Result Date: 08/27/2016 CLINICAL DATA:  Cardiac arrest. EXAM: CT ANGIOGRAPHY CHEST CT ABDOMEN AND PELVIS WITH CONTRAST TECHNIQUE: Multidetector CT imaging of the chest was performed using the standard protocol during bolus administration of intravenous contrast. Multiplanar CT image reconstructions and MIPs were obtained to evaluate the vascular anatomy. Multidetector CT imaging of the abdomen and pelvis was performed using the standard protocol during bolus administration of intravenous contrast. CONTRAST:  100 cc Isovue 370. COMPARISON:  Report of MR abdomen 03/03/2003 and CT abdomen 03/02/2003 are reviewed. Images are not available. FINDINGS: CTA CHEST FINDINGS Cardiovascular: Image quality is degraded by respiratory motion. No large central pulmonary embolus.  Lobar, segmental and subsegmental pulmonary arteries cannot be evaluate. Atherosclerotic calcification of the arterial vasculature, including coronary arteries. Pulmonary arteries and heart are enlarged. No pericardial effusion. Mediastinum/Nodes: Mediastinal lymph nodes measure up to 11 mm in the low right paratracheal and prevascular regions. No definite hilar lymph nodes. Axillary lymph nodes are incompletely imaged and measure up to highly 13 mm. Nasogastric tube is seen within the esophagus, terminating in the stomach. Lungs/Pleura: Image quality is degraded by respiratory motion. Endotracheal tube terminates in the right mainstem bronchus. Basilar dependent patchy airspace consolidation. No definite pleural fluid. Musculoskeletal: No worrisome lytic or sclerotic lesions. Degenerative changes are seen in the spine. Flowing anterior osteophytosis in the thoracic spine. Review of the MIP images confirms the above findings. CT ABDOMEN and PELVIS FINDINGS Hepatobiliary: A 4.9 x 7.2 cm low-attenuation lesion in the dome of the liver appears to have contrast puddling but is not imaged on the delayed nephrographic phase series. Mildly heterogeneous low-attenuation lesion in the left hepatic lobe measures 5.1 x 5.5 cm. Multiple lesions characterized hemangiomas were reported on examination of 03/03/2003. Images  are not available at the time dictation. Today's measurements are larger than on that report. Additional smaller lesions are seen in the caudate and inferior right hepatic lobe. Gallbladder is unremarkable. No biliary ductal dilatation. Pancreas: Negative. Spleen: Negative. Adrenals/Urinary Tract: Adrenal glands are unremarkable. Sub cm low-attenuation lesion in the right kidney is too small to characterize. Ureters are decompressed. Foley catheter and air are seen in a decompressed bladder. Bladder wall appears thickened. Stomach/Bowel: Nasogastric terminates in the stomach. Stomach, small bowel, appendix and  colon are unremarkable. Vascular/Lymphatic: Atherosclerotic calcification of the arterial vasculature without abdominal aortic aneurysm. Portacaval lymph node measures 1.5 cm. Reproductive: Prostate is visualized and contains calcifications. Other: No free fluid. Tiny periumbilical hernia contains fat. Mesenteries and peritoneum are unremarkable. Musculoskeletal: No worrisome lytic or sclerotic lesions. Degenerative changes are seen in the spine and hips. IMPRESSION: 1. Poor assessment of the lobar, segmental and subsegmental pulmonary arteries due to excessive respiratory motion. No definite central pulmonary embolus. 2. Dependent pulmonary parenchymal collapse/consolidation bilaterally. Aspiration and pneumonia are not excluded. 3. Hepatic lesions, previously characterized as hemangiomas on 03/03/2003. Images are not available for comparison. Some lesions measure larger on today's study. If further assessment is desired, MR abdomen without and with contrast is recommended, when the patient is clinically stable and able to hold his breath, preferably as an outpatient. 4. Bladder wall appears thickened. 5.  Aortic atherosclerosis (ICD10-170.0). Electronically Signed: By: Lorin Picket M.D. On: 08/27/2016 16:37   Ct Abdomen Pelvis W Contrast  Addendum Date: 08/27/2016   ADDENDUM REPORT: 08/27/2016 19:56 ADDENDUM: Endotracheal tube terminates in the right mainstem bronchus, as dictated in the original report. Retracting approximately 3-4 cm should better position the tip above the carina. Critical Value/emergent results were called by telephone at the time of interpretation on 08/27/2016 at 7:55 pm to [facility], who verbally acknowledged these results. Electronically Signed   By: Lorin Picket M.D.   On: 08/27/2016 19:56   Result Date: 08/27/2016 CLINICAL DATA:  Cardiac arrest. EXAM: CT ANGIOGRAPHY CHEST CT ABDOMEN AND PELVIS WITH CONTRAST TECHNIQUE: Multidetector CT imaging of the chest was performed using  the standard protocol during bolus administration of intravenous contrast. Multiplanar CT image reconstructions and MIPs were obtained to evaluate the vascular anatomy. Multidetector CT imaging of the abdomen and pelvis was performed using the standard protocol during bolus administration of intravenous contrast. CONTRAST:  100 cc Isovue 370. COMPARISON:  Report of MR abdomen 03/03/2003 and CT abdomen 03/02/2003 are reviewed. Images are not available. FINDINGS: CTA CHEST FINDINGS Cardiovascular: Image quality is degraded by respiratory motion. No large central pulmonary embolus. Lobar, segmental and subsegmental pulmonary arteries cannot be evaluate. Atherosclerotic calcification of the arterial vasculature, including coronary arteries. Pulmonary arteries and heart are enlarged. No pericardial effusion. Mediastinum/Nodes: Mediastinal lymph nodes measure up to 11 mm in the low right paratracheal and prevascular regions. No definite hilar lymph nodes. Axillary lymph nodes are incompletely imaged and measure up to highly 13 mm. Nasogastric tube is seen within the esophagus, terminating in the stomach. Lungs/Pleura: Image quality is degraded by respiratory motion. Endotracheal tube terminates in the right mainstem bronchus. Basilar dependent patchy airspace consolidation. No definite pleural fluid. Musculoskeletal: No worrisome lytic or sclerotic lesions. Degenerative changes are seen in the spine. Flowing anterior osteophytosis in the thoracic spine. Review of the MIP images confirms the above findings. CT ABDOMEN and PELVIS FINDINGS Hepatobiliary: A 4.9 x 7.2 cm low-attenuation lesion in the dome of the liver appears to have contrast puddling but is not  imaged on the delayed nephrographic phase series. Mildly heterogeneous low-attenuation lesion in the left hepatic lobe measures 5.1 x 5.5 cm. Multiple lesions characterized hemangiomas were reported on examination of 03/03/2003. Images are not available at the time  dictation. Today's measurements are larger than on that report. Additional smaller lesions are seen in the caudate and inferior right hepatic lobe. Gallbladder is unremarkable. No biliary ductal dilatation. Pancreas: Negative. Spleen: Negative. Adrenals/Urinary Tract: Adrenal glands are unremarkable. Sub cm low-attenuation lesion in the right kidney is too small to characterize. Ureters are decompressed. Foley catheter and air are seen in a decompressed bladder. Bladder wall appears thickened. Stomach/Bowel: Nasogastric terminates in the stomach. Stomach, small bowel, appendix and colon are unremarkable. Vascular/Lymphatic: Atherosclerotic calcification of the arterial vasculature without abdominal aortic aneurysm. Portacaval lymph node measures 1.5 cm. Reproductive: Prostate is visualized and contains calcifications. Other: No free fluid. Tiny periumbilical hernia contains fat. Mesenteries and peritoneum are unremarkable. Musculoskeletal: No worrisome lytic or sclerotic lesions. Degenerative changes are seen in the spine and hips. IMPRESSION: 1. Poor assessment of the lobar, segmental and subsegmental pulmonary arteries due to excessive respiratory motion. No definite central pulmonary embolus. 2. Dependent pulmonary parenchymal collapse/consolidation bilaterally. Aspiration and pneumonia are not excluded. 3. Hepatic lesions, previously characterized as hemangiomas on 03/03/2003. Images are not available for comparison. Some lesions measure larger on today's study. If further assessment is desired, MR abdomen without and with contrast is recommended, when the patient is clinically stable and able to hold his breath, preferably as an outpatient. 4. Bladder wall appears thickened. 5.  Aortic atherosclerosis (ICD10-170.0). Electronically Signed: By: Lorin Picket M.D. On: 08/27/2016 16:37   Dg Chest Portable 1 View  Result Date: 08/27/2016 CLINICAL DATA:  Central line placement. EXAM: PORTABLE CHEST 1 VIEW  COMPARISON:  08/27/2016. FINDINGS: Stable enlarged cardiac silhouette. Endotracheal tube tip 1.3 cm above the carina and directed toward the right mainstem bronchus. Left jugular catheter tip in the proximal superior vena cava. No pneumothorax. Nasogastric tube extending into the stomach. Significantly less bilateral airspace opacity. Decreased size of a small left pleural effusion. The pulmonary vasculature and interstitial markings remain mildly prominent. Thoracic spine degenerative changes. IMPRESSION: 1. Left jugular catheter tip in the superior vena cava without pneumothorax. 2. Significantly improved bilateral alveolar edema. 3. Small left pleural effusion, also improved. 4. Stable cardiomegaly, pulmonary vascular congestion and probable mild interstitial pulmonary edema. 5. Low position of the endotracheal tube. It is recommended that this be retracted 6 cm. Electronically Signed   By: Claudie Revering M.D.   On: 08/27/2016 15:16   Dg Chest Portable 1 View  Result Date: 08/27/2016 CLINICAL DATA:  65 year old male with history of syncopal event at home. CPR performed. Patient now intubated. EXAM: PORTABLE CHEST 1 VIEW COMPARISON:  Chest x-ray 06/04/2015. FINDINGS: An endotracheal tube is in place with tip 1.5 cm above the carina. A nasogastric tube is seen extending into the stomach, however, the tip of the nasogastric tube extends below the lower margin of the image. Transcutaneous defibrillator pads projecting over the left hemithorax. Lung volumes are low. Patchy multifocal interstitial and airspace disease asymmetrically distributed in the lungs bilaterally. Probable small left pleural effusion which may be loculated laterally. No right pleural effusion. Pulmonary vasculature is obscured. Heart size appears mildly enlarged. Upper mediastinal contours are partially obscured by airspace disease and slightly distorted by patient's rotation to the left. IMPRESSION: 1. Support apparatus, as above. 2.  Multifocal interstitial and airspace disease is slightly asymmetric. While this could represent  underlying cardiogenic or noncardiogenic edema, findings may also reflect multilobar pneumonia or sequela of recent aspiration. 3. Probable small partially loculated left pleural effusion at the base of the left hemithorax. Attention on followup studies is recommended. Should this fail to resolve, further evaluation with nonemergent contrast enhanced chest CT should be considered to exclude underlying mass. Electronically Signed   By: Vinnie Langton M.D.   On: 08/27/2016 13:14    Assessment/Plan 65 year old man with multiple medical disorders presenting with probable severe diffuse anoxic brain injury associated with cardiac arrest. There are no clinical indication seizure activity at this point.  Recommendations: 1. MRI of the brain without contrast when feasible 2. EEG routine adult study after patient's body temperature has returned to normal 3. No anticonvulsant medication is indicated at this point  We will continue to follow this patient with you.  C.R. Nicole Kindred, MD [facility]  08/27/2016, 9:35 PM

## 2016-08-27 NOTE — Progress Notes (Signed)
Per AM RN, MD stated that patient is now 36C Hypothermia protocol. Orders are in for 33C. Tamala Julian, MD called to verify. Orders for 36C was given and MD will put in order set. Also of note, Smith, MD requested ET tube to be pulled out 3cm and to get a repeat chest Xray. Will call RT for ET placement.

## 2016-08-27 NOTE — ED Notes (Signed)
Pt's family has arrived, escorted to consultation room and provided emotional support as well as beverages and tissues. MD aware and chaplain has been contacted.

## 2016-08-27 NOTE — Progress Notes (Signed)
Pharmacy Antibiotic Note  Dean [name] is a 65 y.o. male admitted on 08/27/2016 s/p cardiac arrest.  Pharmacy has been consulted for vancomycin and Zosyn dosing for sepsis.  SCr 1.31 with estimated CrCL 55-60 ml/min.  Patient's WBC and LA are both elevated.   Plan: - Vanc 2500mg  IV x 1, then 1750mg  IV Q24H - Zosyn 3.375gm IV x 1 (give over 30 min), then Q8H, give over 4 hr - Monitor renal fxn, clinical progress, vanc trough at Css  Height: 5\' 5"  (165.1 cm) Weight: (!) 310 lb (140.6 kg) IBW/kg (Calculated) : 61.5  No data recorded.   Recent Labs Lab 08/27/16 1312 08/27/16 1326  WBC 22.1*  --   CREATININE 1.31*  --   LATICACIDVEN  --  5.88*    Estimated Creatinine Clearance: 73 mL/min (by C-G formula based on SCr of 1.31 mg/dL (H)).    No Known Allergies  Antimicrobials this admission:  Vanc 12/3 >> Zosyn 12/3 >>  Dose adjustments this admission:  N/A  Microbiology results:  Pending   Kendyl Bissonnette D. Mina Marble, PharmD, BCPS Pager:  [phone] 08/27/2016, 2:11 PM

## 2016-08-27 NOTE — Progress Notes (Signed)
eLink Physician-Brief Progress Note Patient Name: Dean [name] DOB: 1951/03/15 MRN: [id number]   Date of Service  08/27/2016  HPI/Events of Note  Low potassium  eICU Interventions  replaced     Intervention Category Minor Interventions: Electrolytes abnormality - evaluation and management  Mauri Brooklyn, P 08/27/2016, 6:50 PM

## 2016-08-27 NOTE — Consult Note (Signed)
CONSULTATION NOTE  Reason for Consult: Cardiac arrest  Requesting Physician: Dr. Nelda Marseille  Cardiologist: None (NEW)   HPI: This is a 65 y.o. male with a past medical history which was obtained from the chart. He has a history of type 2 diabetes, asthma and hypertension and presented after a witnessed cardiac arrest. He was walking to the bathroom and suddenly collapsed after returning. Apparently his family was unable to perform CPR however he did receive about 30 minutes of CPR when EMS arrived. He was shocked 5 times and received 450 mg of amiodarone as well as 2 g of magnesium and 7 injections of epinephrine. This ultimately resulted in ROSC. Apparently there was progressive dyspnea leading up to the cardiac arrest. 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 Point-of-care troponin was 0.07. Venous lactate was elevated at 5.88, and white blood cell count was 22,100. Creatinine 1.31. Chest x-ray shows resolving interstitial pulmonary edema. He did not meet criteria for STEMI or urgent Cath Lab transfer. Cardiology is therefore asked to evaluate as to etiology of his cardiac arrest.  PMHx:  Past Medical History:  Diagnosis Date  . Asthma   . Diabetes mellitus without complication ([place])   . Gout   . Hypertension    No past surgical history on file.  FAMHx: No family history on file. Unable to obtain d/t patient condition.  SOCHx:  reports that he has never smoked. He does not have any smokeless tobacco history on file. He reports that he does not drink alcohol or use drugs.  ALLERGIES: No Known Allergies  ROS: Review of systems not obtained due to patient factors.  HOME MEDICATIONS: No current facility-administered medications on file prior to encounter.    Current Outpatient Prescriptions on File Prior to Encounter  Medication Sig Dispense Refill  . allopurinol (ZYLOPRIM) 100 MG tablet Take 100 mg by mouth daily.    [name]  amLODipine-olmesartan (AZOR) 10-40 MG per tablet Take 1 tablet by mouth daily.    . cholecalciferol (VITAMIN D) 1000 UNITS tablet Take 1,000 Units by mouth daily.    . furosemide (LASIX) 40 MG tablet Take 40 mg by mouth daily.    . Ipratropium-Albuterol (COMBIVENT RESPIMAT) 20-100 MCG/ACT AERS respimat Inhale 1 puff into the lungs every 6 (six) hours as needed for wheezing or shortness of breath.    . magnesium gluconate (MAGONATE) 500 MG tablet Take 250 mg by mouth daily.    . metFORMIN (GLUCOPHAGE) 1000 MG tablet Take 500 mg by mouth 2 (two) times daily with a meal.     . metoprolol (TOPROL-XL) 200 MG 24 hr tablet Take 300 mg by mouth daily.    . rosuvastatin (CRESTOR) 10 MG tablet Take 10 mg by mouth daily.      HOSPITAL MEDICATIONS: I have reviewed the patient's current medications.  VITALS: Blood pressure (!) 131/101, pulse 78, temperature 99.5 F (37.5 C), temperature source Core (Comment), resp. rate (!) 23, height '5\' 5"'  (1.651 m), weight (!) 310 lb (140.6 kg), SpO2 100 %.  PHYSICAL EXAM: General appearance: intubated, sedated and paralyzed on artic sun Neck: no carotid bruit, no JVD and thick neck Lungs: diminished breath sounds bilaterally Heart: regular bradycardia, no murmur Abdomen: morbidly obese, soft Extremities: extremities normal, atraumatic, no cyanosis or edema Pulses: 1+ pulses Skin: cool skin Neurologic: Mental status: Intubated, sedated on arctic sun, paralyzed, pupils fixed at 66m bilaterally .  LABS: Results for orders placed or performed during the hospital  encounter of 08/27/16 (from the past 48 hour(s))  I-Stat arterial blood gas, ED     Status: Abnormal   Collection Time: 08/27/16  1:03 PM  Result Value Ref Range   pH, Arterial 7.229 (L) 7.350 - 7.450   pCO2 arterial 55.4 (H) 32.0 - 48.0 mmHg   pO2, Arterial 24.0 (LL) 83.0 - 108.0 mmHg   Bicarbonate 23.1 20.0 - 28.0 mmol/L   TCO2 25 0 - 100 mmol/L   O2 Saturation 32.0 %   Acid-base deficit 5.0 (H)  0.0 - 2.0 mmol/L   Patient temperature HIDE    Collection site BRACHIAL ARTERY    Drawn by Operator    Sample type ARTERIAL    Comment NOTIFIED PHYSICIAN   Urinalysis, Routine w reflex microscopic (not at [facility])     Status: Abnormal   Collection Time: 08/27/16  1:07 PM  Result Value Ref Range   Color, Urine YELLOW YELLOW   APPearance CLEAR CLEAR   Specific Gravity, Urine 1.018 1.005 - 1.030   pH 6.0 5.0 - 8.0   Glucose, UA NEGATIVE NEGATIVE mg/dL   Hgb urine dipstick NEGATIVE NEGATIVE   Bilirubin Urine NEGATIVE NEGATIVE   Ketones, ur NEGATIVE NEGATIVE mg/dL   Protein, ur NEGATIVE NEGATIVE mg/dL   Nitrite POSITIVE (A) NEGATIVE   Leukocytes, UA TRACE (A) NEGATIVE  Urine microscopic-add on     Status: Abnormal   Collection Time: 08/27/16  1:07 PM  Result Value Ref Range   Squamous Epithelial / LPF 0-5 (A) NONE SEEN   WBC, UA 0-5 0 - 5 WBC/hpf   RBC / HPF 0-5 0 - 5 RBC/hpf   Bacteria, UA RARE (A) NONE SEEN  Basic metabolic panel     Status: Abnormal   Collection Time: 08/27/16  1:12 PM  Result Value Ref Range   Sodium 139 135 - 145 mmol/L   Potassium 3.6 3.5 - 5.1 mmol/L   Chloride 106 101 - 111 mmol/L   CO2 21 (L) 22 - 32 mmol/L   Glucose, Bld 157 (H) 65 - 99 mg/dL   BUN 18 6 - 20 mg/dL   Creatinine, Ser 1.31 (H) 0.61 - 1.24 mg/dL   Calcium 9.8 8.9 - 10.3 mg/dL   GFR calc non Af Amer 55 (L) >60 mL/min   GFR calc Af Amer >60 >60 mL/min    Comment: (NOTE) The eGFR has been calculated using the CKD EPI equation. This calculation has not been validated in all clinical situations. eGFR's persistently <60 mL/min signify possible Chronic Kidney Disease.    Anion gap 12 5 - 15  CBC     Status: Abnormal   Collection Time: 08/27/16  1:12 PM  Result Value Ref Range   WBC 22.1 (H) 4.0 - 10.5 K/uL   RBC 4.68 4.22 - 5.81 MIL/uL   Hemoglobin 12.7 (L) 13.0 - 17.0 g/dL   HCT 39.9 39.0 - 52.0 %   MCV 85.3 78.0 - 100.0 fL   MCH 27.1 26.0 - 34.0 pg   MCHC 31.8 30.0 - 36.0 g/dL   RDW  13.6 11.5 - 15.5 %   Platelets 238 150 - 400 K/uL  Lipase, blood     Status: None   Collection Time: 08/27/16  1:12 PM  Result Value Ref Range   Lipase 16 11 - 51 U/L  I-stat troponin, ED     Status: None   Collection Time: 08/27/16  1:24 PM  Result Value Ref Range   Troponin i, poc 0.07 0.00 -  0.08 ng/mL   Comment 3            Comment: Due to the release kinetics of cTnI, a negative result within the first hours of the onset of symptoms does not rule out myocardial infarction with certainty. If myocardial infarction is still suspected, repeat the test at appropriate intervals.   I-Stat CG4 Lactic Acid, ED     Status: Abnormal   Collection Time: 08/27/16  1:26 PM  Result Value Ref Range   Lactic Acid, Venous 5.88 (HH) 0.5 - 1.9 mmol/L   Comment NOTIFIED PHYSICIAN     IMAGING: Ct Head Wo Contrast  Result Date: 08/27/2016 CLINICAL DATA:  POST CPRwitnessed cardiac arrest. After ambulating to the bathroom, pt returned and suddenly collapsed. Pt's family was unable to perform CPR, but EMS arrived and performed CPR from 11:19 AM to 11:42 AM. EXAM: CT HEAD WITHOUT CONTRAST TECHNIQUE: Contiguous axial images were obtained from the base of the skull through the vertex without intravenous contrast. COMPARISON:  03/29/2005 FINDINGS: Brain: No evidence of acute infarction, hemorrhage, hydrocephalus, extra-axial collection or mass lesion/mass effect. The ventricles are normal in configuration. 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 No hydrocephalus. There is mild patchy white matter hypoattenuation consistent with chronic microvascular ischemic change. Vascular: No hyperdense vessel or unexpected calcification. Skull: Normal. Negative for fracture or focal lesion. Sinuses/Orbits: Normal globes and orbits. Visualized sinuses and mastoid air cells are clear. Other: None. IMPRESSION: 1. No acute intracranial abnormalities. 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 Electronically Signed   By: Lajean Manes M.D.   On: 08/27/2016 16:15   Ct Angio Chest Pe W And/or Wo Contrast  Result Date: 08/27/2016 CLINICAL DATA:  Cardiac arrest. EXAM: CT ANGIOGRAPHY CHEST CT ABDOMEN AND PELVIS WITH CONTRAST TECHNIQUE: Multidetector CT imaging of the chest was performed using the standard protocol during bolus administration of intravenous contrast. Multiplanar CT image reconstructions and MIPs were obtained to evaluate the vascular anatomy. Multidetector CT imaging of the abdomen and pelvis was performed using the standard protocol during bolus administration of intravenous contrast. CONTRAST:  100 cc Isovue 370. COMPARISON:  Report of MR abdomen 03/03/2003 and CT abdomen 03/02/2003 are reviewed. Images are not available. FINDINGS: CTA CHEST FINDINGS Cardiovascular: Image quality is degraded by respiratory motion. No large central pulmonary embolus. Lobar, segmental and subsegmental pulmonary arteries cannot be evaluate. Atherosclerotic calcification of the arterial vasculature, including coronary arteries. Pulmonary arteries and heart are enlarged. No pericardial effusion. Mediastinum/Nodes: Mediastinal lymph nodes measure up to 11 mm in the low right paratracheal and prevascular regions. No definite hilar lymph nodes. Axillary lymph nodes are incompletely imaged and measure up to highly 13 mm. Nasogastric tube is seen within the esophagus, terminating in the stomach. Lungs/Pleura: Image quality is degraded by respiratory motion. Endotracheal tube terminates in the right mainstem bronchus. Basilar dependent patchy airspace consolidation. No definite pleural fluid. Musculoskeletal: No worrisome lytic or sclerotic lesions. Degenerative changes are seen in the spine. Flowing anterior osteophytosis in the thoracic spine. Review of the MIP images confirms the above findings. CT ABDOMEN and PELVIS FINDINGS Hepatobiliary: A 4.9 x 7.2 cm low-attenuation lesion in  the dome of the liver appears to have contrast puddling but is not imaged on the delayed nephrographic phase series. Mildly heterogeneous low-attenuation lesion in the left hepatic lobe measures 5.1 x 5.5 cm. Multiple lesions characterized hemangiomas were reported on examination of 03/03/2003. Images are not available at the time dictation. Today's measurements are  larger than on that report. Additional smaller lesions are seen in the caudate and inferior right hepatic lobe. Gallbladder is unremarkable. No biliary ductal dilatation. Pancreas: Negative. Spleen: Negative. Adrenals/Urinary Tract: Adrenal glands are unremarkable. Sub cm low-attenuation lesion in the right kidney is too small to characterize. Ureters are decompressed. Foley catheter and air are seen in a decompressed bladder. Bladder wall appears thickened. Stomach/Bowel: Nasogastric terminates in the stomach. Stomach, small bowel, appendix and colon are unremarkable. Vascular/Lymphatic: Atherosclerotic calcification of the arterial vasculature without abdominal aortic aneurysm. Portacaval lymph node measures 1.5 cm. Reproductive: Prostate is visualized and contains calcifications. Other: No free fluid. Tiny periumbilical hernia contains fat. Mesenteries and peritoneum are unremarkable. Musculoskeletal: No worrisome lytic or sclerotic lesions. Degenerative changes are seen in the spine and hips. IMPRESSION: 1. Poor assessment of the lobar, segmental and subsegmental pulmonary arteries due to excessive respiratory motion. No definite central pulmonary embolus. 2. Dependent pulmonary parenchymal collapse/consolidation bilaterally. Aspiration and pneumonia are not excluded. 3. Hepatic lesions, previously characterized as hemangiomas on 03/03/2003. Images are not available for comparison. Some lesions measure larger on today's study. If further assessment is desired, MR abdomen without and with contrast is recommended, when the patient is clinically stable  and able to hold his breath, preferably as an outpatient. 4. Bladder wall appears thickened. 5.  Aortic atherosclerosis (ICD10-170.0). Electronically Signed   By: Lorin Picket M.D.   On: 08/27/2016 16:37   Ct Abdomen Pelvis W Contrast  Result Date: 08/27/2016 CLINICAL DATA:  Cardiac arrest. EXAM: CT ANGIOGRAPHY CHEST CT ABDOMEN AND PELVIS WITH CONTRAST TECHNIQUE: Multidetector CT imaging of the chest was performed using the standard protocol during bolus administration of intravenous contrast. Multiplanar CT image reconstructions and MIPs were obtained to evaluate the vascular anatomy. Multidetector CT imaging of the abdomen and pelvis was performed using the standard protocol during bolus administration of intravenous contrast. CONTRAST:  100 cc Isovue 370. COMPARISON:  Report of MR abdomen 03/03/2003 and CT abdomen 03/02/2003 are reviewed. Images are not available. FINDINGS: CTA CHEST FINDINGS Cardiovascular: Image quality is degraded by respiratory motion. No large central pulmonary embolus. Lobar, segmental and subsegmental pulmonary arteries cannot be evaluate. Atherosclerotic calcification of the arterial vasculature, including coronary arteries. Pulmonary arteries and heart are enlarged. No pericardial effusion. Mediastinum/Nodes: Mediastinal lymph nodes measure up to 11 mm in the low right paratracheal and prevascular regions. No definite hilar lymph nodes. Axillary lymph nodes are incompletely imaged and measure up to highly 13 mm. Nasogastric tube is seen within the esophagus, terminating in the stomach. Lungs/Pleura: Image quality is degraded by respiratory motion. Endotracheal tube terminates in the right mainstem bronchus. Basilar dependent patchy airspace consolidation. No definite pleural fluid. Musculoskeletal: No worrisome lytic or sclerotic lesions. Degenerative changes are seen in the spine. Flowing anterior osteophytosis in the thoracic spine. Review of the MIP images confirms the above  findings. CT ABDOMEN and PELVIS FINDINGS Hepatobiliary: A 4.9 x 7.2 cm low-attenuation lesion in the dome of the liver appears to have contrast puddling but is not imaged on the delayed nephrographic phase series. Mildly heterogeneous low-attenuation lesion in the left hepatic lobe measures 5.1 x 5.5 cm. Multiple lesions characterized hemangiomas were reported on examination of 03/03/2003. Images are not available at the time dictation. Today's measurements are larger than on that report. Additional smaller lesions are seen in the caudate and inferior right hepatic lobe. Gallbladder is unremarkable. No biliary ductal dilatation. Pancreas: Negative. Spleen: Negative. Adrenals/Urinary Tract: Adrenal glands are unremarkable. Sub cm low-attenuation lesion in the  right kidney is too small to characterize. Ureters are decompressed. Foley catheter and air are seen in a decompressed bladder. Bladder wall appears thickened. Stomach/Bowel: Nasogastric terminates in the stomach. Stomach, small bowel, appendix and colon are unremarkable. Vascular/Lymphatic: Atherosclerotic calcification of the arterial vasculature without abdominal aortic aneurysm. Portacaval lymph node measures 1.5 cm. Reproductive: Prostate is visualized and contains calcifications. Other: No free fluid. Tiny periumbilical hernia contains fat. Mesenteries and peritoneum are unremarkable. Musculoskeletal: No worrisome lytic or sclerotic lesions. Degenerative changes are seen in the spine and hips. IMPRESSION: 1. Poor assessment of the lobar, segmental and subsegmental pulmonary arteries due to excessive respiratory motion. No definite central pulmonary embolus. 2. Dependent pulmonary parenchymal collapse/consolidation bilaterally. Aspiration and pneumonia are not excluded. 3. Hepatic lesions, previously characterized as hemangiomas on 03/03/2003. Images are not available for comparison. Some lesions measure larger on today's study. If further assessment is  desired, MR abdomen without and with contrast is recommended, when the patient is clinically stable and able to hold his breath, preferably as an outpatient. 4. Bladder wall appears thickened. 5.  Aortic atherosclerosis (ICD10-170.0). Electronically Signed   By: Lorin Picket M.D.   On: 08/27/2016 16:37   Dg Chest Portable 1 View  Result Date: 08/27/2016 CLINICAL DATA:  Central line placement. EXAM: PORTABLE CHEST 1 VIEW COMPARISON:  08/27/2016. FINDINGS: Stable enlarged cardiac silhouette. Endotracheal tube tip 1.3 cm above the carina and directed toward the right mainstem bronchus. Left jugular catheter tip in the proximal superior vena cava. No pneumothorax. Nasogastric tube extending into the stomach. Significantly less bilateral airspace opacity. Decreased size of a small left pleural effusion. The pulmonary vasculature and interstitial markings remain mildly prominent. Thoracic spine degenerative changes. IMPRESSION: 1. Left jugular catheter tip in the superior vena cava without pneumothorax. 2. Significantly improved bilateral alveolar edema. 3. Small left pleural effusion, also improved. 4. Stable cardiomegaly, pulmonary vascular congestion and probable mild interstitial pulmonary edema. 5. Low position of the endotracheal tube. It is recommended that this be retracted 6 cm. Electronically Signed   By: Claudie Revering M.D.   On: 08/27/2016 15:16   Dg Chest Portable 1 View  Result Date: 08/27/2016 CLINICAL DATA:  65 year old male with history of syncopal event at home. CPR performed. Patient now intubated. EXAM: PORTABLE CHEST 1 VIEW COMPARISON:  Chest x-ray 06/04/2015. FINDINGS: An endotracheal tube is in place with tip 1.5 cm above the carina. A nasogastric tube is seen extending into the stomach, however, the tip of the nasogastric tube extends below the lower margin of the image. Transcutaneous defibrillator pads projecting over the left hemithorax. Lung volumes are low. Patchy multifocal  interstitial and airspace disease asymmetrically distributed in the lungs bilaterally. Probable small left pleural effusion which may be loculated laterally. No right pleural effusion. Pulmonary vasculature is obscured. Heart size appears mildly enlarged. Upper mediastinal contours are partially obscured by airspace disease and slightly distorted by patient's rotation to the left. IMPRESSION: 1. Support apparatus, as above. 2. Multifocal interstitial and airspace disease is slightly asymmetric. While this could represent underlying cardiogenic or noncardiogenic edema, findings may also reflect multilobar pneumonia or sequela of recent aspiration. 3. Probable small partially loculated left pleural effusion at the base of the left hemithorax. Attention on followup studies is recommended. Should this fail to resolve, further evaluation with nonemergent contrast enhanced chest CT should be considered to exclude underlying mass. Electronically Signed   By: Vinnie Langton M.D.   On: 08/27/2016 13:14    HOSPITAL DIAGNOSES: Principal Problem:  Cardiopulmonary arrest ([place]) Active Problems:   DM2 (diabetes mellitus, type 2) ([place])   Morbid obesity ([place])   Essential hypertension, benign  IMPRESSION: 1. Cardiopulmonary arrest  RECOMMENDATION: Difficult to know chain of events, however, it seems that he has had progressive dyspnea leading up to cardiac arrest, suggesting the arrest may have been due to hypoxia. Rhythm was reportedly either VT or VF, multiple shocks. Per nursing had a short run of NSVT in the CT scanner. Has already received 450 mg amiodarone- now in sinus bradycardia. Personally reviewed 12 lead EKG, shows an IVCD and sinus bradycardia with inferior T wave inversions. There is no acute indication for cardiac catheterization. I agree [employer] [facility] Protocol. He may ultimately need cardiac catheterization based on neurologic recovery, as total down time was close to 30 minutes and he had a delay in  CPR until EMS arrived. Will order and echo. Follow cardiac enzymes. Heparinize if troponin becomes positive. Given bradycardia, would consider lidocaine if necessary for recurrent ventricular arrhythmias.   Cardiology will follow with you. Thanks for the consult.  Time Spent Directly with Patient: 45 minutes  CRITICAL CARE:  The patient is critically ill with multi-organ system failure and requires high complexity decision making for assessment and support, frequent evaluation and titration of therapies, application of advanced monitoring technologies and extensive interpretation of multiple databases.  Pixie Casino, MD, [facility] Attending Cardiologist [place] 08/27/2016, 5:03 PM

## 2016-08-27 NOTE — H&P (Signed)
PULMONARY / CRITICAL CARE MEDICINE   Name: Dyan Litten MRN: [id number] DOB: 11/11/1950    ADMISSION DATE:  08/27/2016   REFERRING MD:  EDP  CHIEF COMPLAINT:   Post arrest   HISTORY OF PRESENT ILLNESS:   65 yo MO (310 lbs 5'5") AAM who was walking back from the bathroom 12/3 when he collapsed pulseless. CPR wa not performed by family and EMS arrived in 10 minutes and performed CPR x 21 minutes, 7 rounds of epi, 5 shocks, amiodarone and magnesium. He coded again with ROSC. King airway was changed by EDP. He was taken to CT scanner with NAD of brain, transfer to CCU and cardiology was called. Hypothermia protocol was initiated at 1545 after ct head. His prognosis for meaningful recover is poor.  PAST MEDICAL HISTORY :  He  has a past medical history of Asthma; Diabetes mellitus without complication ([place]); Gout; and Hypertension.  PAST SURGICAL HISTORY: He  has no past surgical history on file.  No Known Allergies  No current facility-administered medications on file prior to encounter.    Current Outpatient Prescriptions on File Prior to Encounter  Medication Sig  . allopurinol (ZYLOPRIM) 100 MG tablet Take 100 mg by mouth daily.  [name] amLODipine-olmesartan (AZOR) 10-40 MG per tablet Take 1 tablet by mouth daily.  . cholecalciferol (VITAMIN D) 1000 UNITS tablet Take 1,000 Units by mouth daily.  . furosemide (LASIX) 40 MG tablet Take 40 mg by mouth daily.  . Ipratropium-Albuterol (COMBIVENT RESPIMAT) 20-100 MCG/ACT AERS respimat Inhale 1 puff into the lungs every 6 (six) hours as needed for wheezing or shortness of breath.  . magnesium gluconate (MAGONATE) 500 MG tablet Take 250 mg by mouth daily.  . metFORMIN (GLUCOPHAGE) 1000 MG tablet Take 500 mg by mouth 2 (two) times daily with a meal.   . metoprolol (TOPROL-XL) 200 MG 24 hr tablet Take 300 mg by mouth daily.  . rosuvastatin (CRESTOR) 10 MG tablet Take 10 mg by mouth daily.    FAMILY HISTORY:  His has no family status  information on file.    SOCIAL HISTORY: He  reports that he has never smoked. He does not have any smokeless tobacco history on file. He reports that he does not drink alcohol or use drugs.  REVIEW OF SYSTEMS:   NA  SUBJECTIVE:  MOAAM on vent  VITAL SIGNS: BP (!) 131/101 (BP Location: Left Arm)   Pulse 78   Temp 99.5 F (37.5 C) (Core (Comment))   Resp (!) 23   Ht 5\' 5"  (1.651 m)   Wt (!) 310 lb (140.6 kg)   SpO2 100%   BMI 51.59 kg/m   HEMODYNAMICS:    VENTILATOR SETTINGS: Vent Mode: PRVC FiO2 (%):  [100 %] 100 % Set Rate:  [16 bmp-24 bmp] 24 bmp Vt Set:  [580 mL-600 mL] 580 mL PEEP:  [5 cmH20] 5 cmH20 Plateau Pressure:  [21 cmH20-29 cmH20] 29 cmH20  INTAKE / OUTPUT: No intake/output data recorded.  PHYSICAL EXAMINATION: General:  MOAAM sedated on vent Neuro:  Sedated and NMB on board HEENT:  No neck, ET inplace Cardiovascular:  HSD Lungs: Rhonchi and crackled bilaterally  Abdomen: Obese soft  Musculoskeletal:ntact Skin: cooland dry  LABS:  BMET  Recent Labs Lab 08/27/16 1312  NA 139  K 3.6  CL 106  CO2 21*  BUN 18  CREATININE 1.31*  GLUCOSE 157*    Electrolytes  Recent Labs Lab 08/27/16 1312  CALCIUM 9.8    CBC  Recent Labs Lab  08/27/16 1312  WBC 22.1*  HGB 12.7*  HCT 39.9  PLT 238    Coag's No results for input(s): APTT, INR in the last 168 hours.  Sepsis Markers  Recent Labs Lab 08/27/16 1326  LATICACIDVEN 5.88*    ABG  Recent Labs Lab 08/27/16 1303  PHART 7.229*  PCO2ART 55.4*  PO2ART 24.0*    Liver Enzymes No results for input(s): AST, ALT, ALKPHOS, BILITOT, ALBUMIN in the last 168 hours.  Cardiac Enzymes No results for input(s): TROPONINI, PROBNP in the last 168 hours.  Glucose No results for input(s): GLUCAP in the last 168 hours.  Imaging Ct Head Wo Contrast  Result Date: 08/27/2016 CLINICAL DATA:  POST CPRwitnessed cardiac arrest. After ambulating to the bathroom, pt returned and suddenly  collapsed. Pt's family was unable to perform CPR, but EMS arrived and performed CPR from 11:19 AM to 11:42 AM. EXAM: CT HEAD WITHOUT CONTRAST TECHNIQUE: Contiguous axial images were obtained from the base of the skull through the vertex without intravenous contrast. COMPARISON:  03/29/2005 FINDINGS: Brain: No evidence of acute infarction, hemorrhage, hydrocephalus, extra-axial collection or mass lesion/mass effect. The ventricles are normal in configuration. 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 No hydrocephalus. There is mild patchy white matter hypoattenuation consistent with chronic microvascular ischemic change. Vascular: No hyperdense vessel or unexpected calcification. Skull: Normal. Negative for fracture or focal lesion. Sinuses/Orbits: Normal globes and orbits. Visualized sinuses and mastoid air cells are clear. Other: None. IMPRESSION: 1. No acute intracranial abnormalities. 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 Electronically Signed   By: Lajean Manes M.D.   On: 08/27/2016 16:15   Dg Chest Portable 1 View  Result Date: 08/27/2016 CLINICAL DATA:  Central line placement. EXAM: PORTABLE CHEST 1 VIEW COMPARISON:  08/27/2016. FINDINGS: Stable enlarged cardiac silhouette. Endotracheal tube tip 1.3 cm above the carina and directed toward the right mainstem bronchus. Left jugular catheter tip in the proximal superior vena cava. No pneumothorax. Nasogastric tube extending into the stomach. Significantly less bilateral airspace opacity. Decreased size of a small left pleural effusion. The pulmonary vasculature and interstitial markings remain mildly prominent. Thoracic spine degenerative changes. IMPRESSION: 1. Left jugular catheter tip in the superior vena cava without pneumothorax. 2. Significantly improved bilateral alveolar edema. 3. Small left pleural effusion, also improved. 4. Stable cardiomegaly, pulmonary vascular congestion  and probable mild interstitial pulmonary edema. 5. Low position of the endotracheal tube. It is recommended that this be retracted 6 cm. Electronically Signed   By: Claudie Revering M.D.   On: 08/27/2016 15:16   Dg Chest Portable 1 View  Result Date: 08/27/2016 CLINICAL DATA:  65 year old male with history of syncopal event at home. CPR performed. Patient now intubated. EXAM: PORTABLE CHEST 1 VIEW COMPARISON:  Chest x-ray 06/04/2015. FINDINGS: An endotracheal tube is in place with tip 1.5 cm above the carina. A nasogastric tube is seen extending into the stomach, however, the tip of the nasogastric tube extends below the lower margin of the image. Transcutaneous defibrillator pads projecting over the left hemithorax. Lung volumes are low. Patchy multifocal interstitial and airspace disease asymmetrically distributed in the lungs bilaterally. Probable small left pleural effusion which may be loculated laterally. No right pleural effusion. Pulmonary vasculature is obscured. Heart size appears mildly enlarged. Upper mediastinal contours are partially obscured by airspace disease and slightly distorted by patient's rotation to the left. IMPRESSION: 1. Support apparatus, as above. 2. Multifocal interstitial and airspace disease is slightly asymmetric. While this  could represent underlying cardiogenic or noncardiogenic edema, findings may also reflect multilobar pneumonia or sequela of recent aspiration. 3. Probable small partially loculated left pleural effusion at the base of the left hemithorax. Attention on followup studies is recommended. Should this fail to resolve, further evaluation with nonemergent contrast enhanced chest CT should be considered to exclude underlying mass. Electronically Signed   By: Vinnie Langton M.D.   On: 08/27/2016 13:14     STUDIES:    CULTURES: 12/3 bc>> 12/3 sputum>> 12/3 urine>>  ANTIBIOTICS: None  SIGNIFICANT EVENTS: Cardiac arrest 12/3  LINES/TUBES: 12/3 ET>> 12/3  left I J CVL>>  DISCUSSION: 65 yo MO (310 lbs 5'5") AAM who was walking back from the bathroom 12/3 when he collapsed pulseless. CPR wa not performed by family and EMS arrived in 10 minutes and performed CPR x 21 minutes, 7 rounds of epi, 5 shocks, amiodarone and magnesium. He coded again with ROSC. King airway was changed by EDP. He was taken to CT scanner with NAD of brain, transfer to CCU and cardiology was called. Hypothermia protocol was initiated at 1545 after ct head. His prognosis for meaningful recover is poor.  ASSESSMENT / PLAN:  PULMONARY A: Post cardiac arrest 12/3 with 21 mins of down time Hx of Asthma P:   Vent bundle  CARDIOVASCULAR A:  Post witnessed collapse, family unable to perform CPR, 21 mins of cpr, shocked x 5, 7 Epi  per EMS with [place] Pace airway P:  Hypothermia protocol Prognosis is grim Cards consult  RENAL Lab Results  Component Value Date   CREATININE 1.31 (H) 08/27/2016   CREATININE 0.90 12/16/2012    A:   ARI P:   Avoid nephrotoxins Monitor labs  GASTROINTESTINAL A:   MO GI protection P:   PPI  HEMATOLOGIC  Recent Labs  08/27/16 1312  HGB 12.7*    A:   Sub Q hep P:  Follow labs  INFECTIOUS A:   No overt infection P:   Pan culture  ENDOCRINE A:   DM P:   SSI  NEUROLOGIC A:   Post Cardiac arrest CT head appears negative Sedation/NMB for hypothermia P:   RASS goal:-1 Sedation protocol   FAMILY  - Updates:  Updated per Dr. Nelda Marseille 12/3  - Inter-disciplinary family meet or Palliative Care meeting due by:  day 7    [facility] Minor ACNP Maryanna Shape PCCM Pager [phone] till 3 pm If no answer page [phone] 08/27/2016, 4:18 PM  Attending Note:  65 year old male with extensive cardiac history who presents to the hospital after a cardiac arrest.  Patient was complaining of a cough, went to the bathroom to come back then arrest in the bed.  VF/VT was presenting rhythm.  On exam, patient was starting to posture with  diffuse crackles in all lung fields.  I reviewed CXR myself, ETT ok with cardiomegally.  Spoke with cardiology personally for a stat consult.  Head CT is negative.  Will proceed with hypothermia protocol.  I see no signs of infection and patient is not in refractory shock.  Place TLC.  Admit to the CCU.  The patient is critically ill with multiple organ systems failure and requires high complexity decision making for assessment and support, frequent evaluation and titration of therapies, application of advanced monitoring technologies and extensive interpretation of multiple databases.   Critical Care Time devoted to patient care services described in this note is  35  Minutes. This time reflects time of care of this signee  Dr Jennet Maduro. This critical care time does not reflect procedure time, or teaching time or supervisory time of PA/NP/Med student/Med Resident etc but could involve care discussion time.  Rush Farmer, M.D. [facility] Pulmonary/Critical Care Medicine. Pager: [phone]. After hours pager: [phone].

## 2016-08-27 NOTE — ED Triage Notes (Signed)
Received pt from home with c/o pt ambulated to bathroom, when he returned he fell to floor. Family was not able to perform CPR. EMS arrived began CPR from [phone], using, King airway, 5 shocks, 450 amio, 2 gm mag and 3 epi then pt rearrested and was shocked x 1 with Return of spontaneous circulation.

## 2016-08-27 NOTE — Progress Notes (Signed)
   08/27/16 1400  Clinical Encounter Type  Visited With Family  Visit Type ED  Referral From Nurse  Consult/Referral To Chaplain  Spiritual Encounters  Spiritual Needs Emotional  Stress Factors  Patient Stress Factors Major life changes  Family Stress Factors Major life changes  Responded to post-CPR.  Worked with extended family over 2.5 hr time period providing emotional support and hospitality. Roe Coombs 08/27/16

## 2016-08-27 NOTE — Progress Notes (Signed)
Tamala Julian, MD called and updated regarding patient HR sustaining 47-50. No new orders given at this time. Will continue to monitor patient.

## 2016-08-27 NOTE — Progress Notes (Signed)
Decreased rate to 18 from 24 and O2 to 80% from 100% based on ABG results.

## 2016-08-28 ENCOUNTER — Inpatient Hospital Stay (HOSPITAL_COMMUNITY): Payer: Medicare Other

## 2016-08-28 DIAGNOSIS — G9340 Encephalopathy, unspecified: Secondary | ICD-10-CM

## 2016-08-28 DIAGNOSIS — I469 Cardiac arrest, cause unspecified: Secondary | ICD-10-CM

## 2016-08-28 LAB — ECHOCARDIOGRAM COMPLETE
Height: 65 in
Weight: 4960 oz

## 2016-08-28 LAB — GLUCOSE, CAPILLARY
Glucose-Capillary: 100 mg/dL — ABNORMAL HIGH (ref 65–99)
Glucose-Capillary: 114 mg/dL — ABNORMAL HIGH (ref 65–99)
Glucose-Capillary: 72 mg/dL (ref 65–99)
Glucose-Capillary: 72 mg/dL (ref 65–99)
Glucose-Capillary: 77 mg/dL (ref 65–99)
Glucose-Capillary: 89 mg/dL (ref 65–99)
Glucose-Capillary: 94 mg/dL (ref 65–99)
Glucose-Capillary: 97 mg/dL (ref 65–99)

## 2016-08-28 LAB — POCT I-STAT, CHEM 8
BUN: 22 mg/dL — ABNORMAL HIGH (ref 6–20)
BUN: 21 mg/dL — ABNORMAL HIGH (ref 6–20)
Calcium, Ion: 1.29 mmol/L (ref 1.15–1.40)
Calcium, Ion: 1.29 mmol/L (ref 1.15–1.40)
Chloride: 101 mmol/L (ref 101–111)
Chloride: 102 mmol/L (ref 101–111)
Creatinine, Ser: 1.5 mg/dL — ABNORMAL HIGH (ref 0.61–1.24)
Creatinine, Ser: 1.4 mg/dL — ABNORMAL HIGH (ref 0.61–1.24)
Glucose, Bld: 119 mg/dL — ABNORMAL HIGH (ref 65–99)
Glucose, Bld: 150 mg/dL — ABNORMAL HIGH (ref 65–99)
HCT: 37 % — ABNORMAL LOW (ref 39.0–52.0)
HCT: 38 % — ABNORMAL LOW (ref 39.0–52.0)
Hemoglobin: 12.6 g/dL — ABNORMAL LOW (ref 13.0–17.0)
Hemoglobin: 12.9 g/dL — ABNORMAL LOW (ref 13.0–17.0)
Potassium: 3.4 mmol/L — ABNORMAL LOW (ref 3.5–5.1)
Potassium: 3.3 mmol/L — ABNORMAL LOW (ref 3.5–5.1)
Sodium: 139 mmol/L (ref 135–145)
Sodium: 139 mmol/L (ref 135–145)
TCO2: 23 mmol/L (ref 0–100)
TCO2: 22 mmol/L (ref 0–100)

## 2016-08-28 LAB — BASIC METABOLIC PANEL
Anion gap: 8 (ref 5–15)
BUN: 19 mg/dL (ref 6–20)
CO2: 23 mmol/L (ref 22–32)
Calcium: 8.9 mg/dL (ref 8.9–10.3)
Chloride: 109 mmol/L (ref 101–111)
Creatinine, Ser: 1.29 mg/dL — ABNORMAL HIGH (ref 0.61–1.24)
GFR calc Af Amer: >60 mL/min (ref >60)
GFR calc non Af Amer: 56 mL/min — ABNORMAL LOW (ref >60)
Glucose, Bld: 92 mg/dL (ref 65–99)
Potassium: 3.9 mmol/L (ref 3.5–5.1)
Sodium: 140 mmol/L (ref 135–145)

## 2016-08-28 LAB — PROTIME-INR
INR: 1.17
Prothrombin Time: 15 seconds (ref 11.4–15.2)

## 2016-08-28 LAB — TROPONIN I
Troponin I: 2.73 ng/mL (ref <0.03)
Troponin I: 1.99 ng/mL (ref <0.03)

## 2016-08-28 LAB — HEMOGLOBIN A1C
Hgb A1c MFr Bld: 5.5 % (ref 4.8–5.6)
Mean Plasma Glucose: 111 mg/dL

## 2016-08-28 LAB — APTT: aPTT: 34 seconds (ref 24–36)

## 2016-08-28 LAB — PROCALCITONIN: Procalcitonin: 1.85 ng/mL

## 2016-08-28 MED ORDER — HEPARIN SODIUM (PORCINE) 5000 UNIT/ML IJ SOLN
5000.0000 [IU] | Freq: Three times a day (TID) | INTRAMUSCULAR | Status: DC
  Administered 2016-08-28 – 2016-09-07 (×30): 5000 [IU] via SUBCUTANEOUS
  Filled 2016-08-28 (×28): qty 1

## 2016-08-28 MED ORDER — PERFLUTREN LIPID MICROSPHERE
INTRAVENOUS | Status: AC
  Administered 2016-08-28: 3 mL via INTRAVENOUS
  Filled 2016-08-28: qty 10

## 2016-08-28 MED ORDER — ACETAMINOPHEN 160 MG/5ML PO SOLN
650.0000 mg | ORAL | Status: DC | PRN
  Administered 2016-08-29 – 2016-08-30 (×3): 650 mg
  Filled 2016-08-28 (×4): qty 20.3

## 2016-08-28 MED ORDER — ARTIFICIAL TEARS OP OINT: TOPICAL_OINTMENT | Freq: Three times a day (TID) | OPHTHALMIC | Status: DC

## 2016-08-28 MED ORDER — FUROSEMIDE 10 MG/ML IJ SOLN
20.0000 mg | Freq: Four times a day (QID) | INTRAMUSCULAR | Status: DC
  Administered 2016-08-28 – 2016-08-29 (×4): 20 mg via INTRAVENOUS
  Filled 2016-08-28 (×4): qty 2

## 2016-08-28 MED ORDER — PERFLUTREN LIPID MICROSPHERE
1.0000 mL | INTRAVENOUS | Status: AC | PRN
  Administered 2016-08-28: 3 mL via INTRAVENOUS
  Filled 2016-08-28: qty 10

## 2016-08-28 MED ORDER — SODIUM CHLORIDE 0.9 % IV SOLN
INTRAVENOUS | Status: DC | PRN
  Administered 2016-08-30 – 2016-09-07 (×2): via INTRAVENOUS

## 2016-08-28 NOTE — Progress Notes (Signed)
  Echocardiogram 2D Echocardiogram with Definity has been performed.  Darlina Sicilian M 08/28/2016, 2:49 PM

## 2016-08-28 NOTE — Progress Notes (Signed)
EEG completed at bedside, results pending.  Uneventful study.

## 2016-08-28 NOTE — Progress Notes (Signed)
EKG CRITICAL VALUE     12 lead EKG performed.  Critical value noted, Long QT.  Baldwin Jamaica, RN notified.   Neva Seat, CCT 08/28/2016 7:45 AM

## 2016-08-28 NOTE — Progress Notes (Signed)
eLink Physician-Brief Progress Note Patient Name: Dean [name] DOB: May 22, 1951 MRN: [id number]   Date of Service  08/28/2016  HPI/Events of Note  Troponin elevated post cardiac arrest  eICU Interventions  Monitor, defer heparin to cardiology     Intervention Category Intermediate Interventions: Diagnostic test evaluation  Simonne Maffucci 08/28/2016, 6:01 AM

## 2016-08-28 NOTE — Progress Notes (Addendum)
Patient Name: Dean [name] Date of Encounter: 08/28/2016  Principal Problem:   Cardiopulmonary arrest [facility]) Active Problems:   DM2 (diabetes mellitus, type 2) ([place])   Morbid obesity ([place])   Essential hypertension, benign   Length of Stay: 1  SUBJECTIVE  Sedated, paralyzed.  CURRENT MEDS . chlorhexidine gluconate (MEDLINE KIT)  15 mL Mouth Rinse BID  . famotidine (PEPCID) IV  20 mg Intravenous Q12H  . fentaNYL (SUBLIMAZE) injection  50 mcg Intravenous Once  . heparin  5,000 Units Subcutaneous Q8H  . insulin aspart  0-15 Units Subcutaneous Q4H  . mouth rinse  15 mL Mouth Rinse 10 times per day  . sodium chloride flush  10-40 mL Intracatheter Q12H   . sodium chloride 75 mL/hr at 08/28/16 0935  . cisatracurium (NIMBEX) infusion Stopped (08/27/16 2017)  . fentaNYL infusion INTRAVENOUS 200 mcg/hr (08/28/16 0932)  . midazolam (VERSED) infusion 5 mg/hr (08/28/16 0720)  . norepinephrine (LEVOPHED) Adult infusion Stopped (08/27/16 2106)   OBJECTIVE  Vitals:   08/28/16 0700 08/28/16 0702 08/28/16 0748 08/28/16 0800  BP:  (!) 128/54    Pulse: (!) 58 60 (!) 59   Resp: '10 16 18   ' Temp: (!) 96.1 F (35.6 C)   (!) 96.3 F (35.7 C)  TempSrc:      SpO2: 99% 99% 100%   Weight:      Height:        Intake/Output Summary (Last 24 hours) at 08/28/16 0948 Last data filed at 08/28/16 0700  Gross per 24 hour  Intake          1929.95 ml  Output             1465 ml  Net           464.95 ml   Filed Weights   08/27/16 1251  Weight: (!) 310 lb (140.6 kg)    PHYSICAL EXAM  General: sedated, paralyzed HEENT:  Normal  Neck: Supple without bruits or JVD. Lungs:  Resp regular and unlabored, crackles B/L Heart: RRR no s3, s4, or murmurs. Abdomen: Soft, non-tender, non-distended, BS + x 4.  Extremities: No clubbing, cyanosis or edema. DP/PT/Radials 2+ and equal bilaterally.  Accessory Clinical Findings  CBC  Recent Labs  08/27/16 1312 08/27/16 1622 08/27/16 1839  WBC  22.1*  --   --   HGB 12.7* 12.6* 12.9*  HCT 39.9 37.0* 38.0*  MCV 85.3  --   --   PLT 238  --   --    Basic Metabolic Panel  Recent Labs  08/27/16 2011 08/27/16 2200  NA 137 139  K 3.9 4.0  CL 105 107  CO2 23 25  GLUCOSE 135* 126*  BUN 17 16  CREATININE 1.27* 1.31*  CALCIUM 9.5 9.4    Recent Labs  08/27/16 1312  LIPASE 16   Cardiac Enzymes  Recent Labs  08/27/16 1705 08/27/16 2011 08/28/16 0400  TROPONINI 1.54* 2.61* 2.73*    Recent Labs  08/27/16 1705  HGBA1C 5.5   Radiology/Studies  Ct Head Wo Contrast  Result Date: 08/27/2016 CLINICAL DATA:  POST CPRwitnessed cardiac arrest.  IMPRESSION: 1. No acute intracranial abnormalities. 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 Electronically Signed   By: Lajean Manes M.D.   On: 08/27/2016 16:15   Ct Angio Chest Pe W And/or Wo Contrast  Addendum Date: 08/27/2016   ADDENDUM REPORT: 08/27/2016 19:56 ADDENDUM: Endotracheal tube terminates in the right mainstem bronchus, as dictated in the original report.  Retracting approximately 3-4 cm should better position the tip above the carina. Critical Value/emergent results were called by telephone at the time of interpretation on 08/27/2016 at 7:55 pm to [facility], who verbally acknowledged these results. Electronically Signed   By: Lorin Picket M.D.   On: 08/27/2016 19:56   Result Date: 08/27/2016 CLINICAL DATA:  Cardiac arrest. EXAM: CT ANGIOGRAPHY CHEST CT ABDOMEN AND PELVIS WITH CONTRAST  IMPRESSION: 1. Poor assessment of the lobar, segmental and subsegmental pulmonary arteries due to excessive respiratory motion. No definite central pulmonary embolus. 2. Dependent pulmonary parenchymal collapse/consolidation bilaterally. Aspiration and pneumonia are not excluded. 3. Hepatic lesions, previously characterized as hemangiomas on 03/03/2003. Images are not available for comparison. Some lesions measure larger on today's study. If further assessment  is desired, MR abdomen without and with contrast is recommended, when the patient is clinically stable and able to hold his breath, preferably as an outpatient. 4. Bladder wall appears thickened. 5.  Aortic atherosclerosis (ICD10-170.0). Electronically Signed: By: Lorin Picket M.D. On: 08/27/2016 16:37   Dg Chest Port 1 View  Result Date: 08/27/2016 CLINICAL DATA:  Endotracheal tube placement.  Initial encounter. EXAM: PORTABLE CHEST 1 VIEW COMPARISON:  CTA of the chest performed earlier today at 3:47 p.m. FINDINGS: The patient's endotracheal tube is seen ending 1-2 cm above the carina. This could be retracted 1-2 cm. An enteric tube is noted extending below the diaphragm. Bilateral airspace opacification is noted, with underlying vascular congestion. This may reflect pulmonary edema or pneumonia. No pleural effusion or pneumothorax is seen. The cardiomediastinal silhouette is enlarged. No acute osseous abnormalities are seen. An external pacing pad is noted. IMPRESSION: 1. Endotracheal tube seen ending 1-2 cm above the carina. This could be retracted 1-2 cm. 2. Bilateral airspace opacification, with underlying vascular congestion. This may reflect pulmonary edema or pneumonia. 3.  On: 08/27/2016 15:16   TELE: PVCs, couplets  ECG: SR, LBBB    ASSESSMENT AND PLAN  1. S/P cardiac arrest, minimal troponin elevation, most probably sec to shock applied by EMS for VT/VF, ECG shows unchanged LBBB since 2014, TTE is pending. On hypothermia protocol, with minimal support of Levophed. We will follow, CT head negative for stroke, EEG show no seizure activity. Ischemia workup once patient recovers.   2. Acute CHF - we will give low dose lasix 20 mg iv q6h  Signed, Ena Dawley MD, [facility] 08/28/2016

## 2016-08-28 NOTE — Progress Notes (Addendum)
Subjective: No further episodes of concern  Exam: Vitals:   08/28/16 0748 08/28/16 0800  BP:    Pulse: (!) 59   Resp: 18   Temp:  (!) 96.3 F (35.7 C)   Gen: In bed, NAD Resp: non-labored breathing, no acute distress Abd: soft, nt  Neuro: MS: does not open eyes or follow commands [id number], eyes dysconjugate(outwardly deviated), corneals intact Motor: flexion to noxious stimulation bilaterally.  Sensory:as above.   Pertinent Labs: Elevated creatinine  Impression: 65 yo M s/p cardiac arrest. No clear evidence for seizures. I think it is still too early to comment on prognosis.   Recommendations: 1) will follow.   Roland Rack, MD [facility] [phone]  If 7pm- 7am, please page neurology on call as listed in [place].

## 2016-08-28 NOTE — Progress Notes (Signed)
PULMONARY / CRITICAL CARE MEDICINE   Name: Dean [name] MRN: [id number] DOB: 21-Jun-1951    ADMISSION DATE:  08/27/2016   REFERRING MD:  EDP  CHIEF COMPLAINT:   Post arrest   HISTORY OF PRESENT ILLNESS:   65 yo MO (310 lbs 5'5") AAM who was walking back from the bathroom 12/3 when he collapsed pulseless. CPR wa not performed by family and EMS arrived in 10 minutes and performed CPR x 21 minutes, 7 rounds of epi, 5 shocks, amiodarone and magnesium. He coded again with ROSC. King airway was changed by EDP. He was taken to CT scanner with NAD of brain, transfer to CCU and cardiology was called. Hypothermia protocol was initiated at 1545 after ct head. His prognosis for meaningful recover is poor.  SUBJECTIVE:  Note goal T was changed to 36C due to rhythm disturbances w cooling  VITAL SIGNS: BP (!) 128/54   Pulse (!) 59   Temp (!) 96.3 F (35.7 C)   Resp 18   Ht 5\' 5"  (1.651 m)   Wt (!) 140.6 kg (310 lb)   SpO2 100%   BMI 51.59 kg/m   HEMODYNAMICS: CVP:  [11 mmHg-12 mmHg] 12 mmHg  VENTILATOR SETTINGS: Vent Mode: PRVC FiO2 (%):  [60 %-100 %] 60 % Set Rate:  [16 bmp-24 bmp] 18 bmp Vt Set:  [580 mL-600 mL] 580 mL PEEP:  [5 cmH20] 5 cmH20 Plateau Pressure:  [21 cmH20-29 cmH20] 28 cmH20  INTAKE / OUTPUT: I/O last 3 completed shifts: In: 1930 [I.V.:1615; IV Piggyback:315] Out: [id number] [Urine:1265; Emesis/NG output:200]  PHYSICAL EXAMINATION: General:  MOAAM sedated on vent Neuro:  Sedated, not following any commands.  HEENT:  No neck, ETT in place Cardiovascular:  HSD Lungs: Rhonchi and crackled bilaterally  Abdomen: Obese soft  Musculoskeletal:ntact Skin: cooland dry  LABS:  BMET  Recent Labs Lab 08/27/16 1706 08/27/16 1839 08/27/16 2011 08/27/16 2200  NA 138 139 137 139  K 3.4* 3.3* 3.9 4.0  CL 105 102 105 107  CO2 24  --  23 25  BUN 20 21* 17 16  CREATININE 1.51* 1.40* 1.27* 1.31*  GLUCOSE 114* 150* 135* 126*    Electrolytes  Recent Labs Lab  08/27/16 1706 08/27/16 2011 08/27/16 2200  CALCIUM 9.4 9.5 9.4    CBC  Recent Labs Lab 08/27/16 1312 08/27/16 1622 08/27/16 1839  WBC 22.1*  --   --   HGB 12.7* 12.6* 12.9*  HCT 39.9 37.0* 38.0*  PLT 238  --   --     Coag's  Recent Labs Lab 08/27/16 1705 08/28/16 0011  APTT 26 34  INR 1.13 1.17    Sepsis Markers  Recent Labs Lab 08/27/16 1326 08/27/16 1937 08/28/16 0400  LATICACIDVEN 5.88*  --   --   PROCALCITON  --  1.26 1.85    ABG  Recent Labs Lab 08/27/16 1303 08/27/16 1732  PHART 7.229* 7.538*  PCO2ART 55.4* 26.2*  PO2ART 24.0* 207.0*    Liver Enzymes No results for input(s): AST, ALT, ALKPHOS, BILITOT, ALBUMIN in the last 168 hours.  Cardiac Enzymes  Recent Labs Lab 08/27/16 1705 08/27/16 2011 08/28/16 0400  TROPONINI 1.54* 2.61* 2.73*    Glucose  Recent Labs Lab 08/27/16 2033 08/28/16 0011 08/28/16 0401 08/28/16 0818 08/28/16 0851  GLUCAP 141* 100* 114* 97 89    Imaging Ct Head Wo Contrast  Result Date: 08/27/2016 CLINICAL DATA:  POST CPRwitnessed cardiac arrest. After ambulating to the bathroom, pt returned and suddenly collapsed. Pt's family was  unable to perform CPR, but EMS arrived and performed CPR from 11:19 AM to 11:42 AM. EXAM: CT HEAD WITHOUT CONTRAST TECHNIQUE: Contiguous axial images were obtained from the base of the skull through the vertex without intravenous contrast. COMPARISON:  03/29/2005 FINDINGS: Brain: No evidence of acute infarction, hemorrhage, hydrocephalus, extra-axial collection or mass lesion/mass effect. The ventricles are normal in configuration. 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 No hydrocephalus. There is mild patchy white matter hypoattenuation consistent with chronic microvascular ischemic change. Vascular: No hyperdense vessel or unexpected calcification. Skull: Normal. Negative for fracture or focal lesion. Sinuses/Orbits: Normal globes and orbits.  Visualized sinuses and mastoid air cells are clear. Other: None. IMPRESSION: 1. No acute intracranial abnormalities. 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 Electronically Signed   By: Lajean Manes M.D.   On: 08/27/2016 16:15   Ct Angio Chest Pe W And/or Wo Contrast  Addendum Date: 08/27/2016   ADDENDUM REPORT: 08/27/2016 19:56 ADDENDUM: Endotracheal tube terminates in the right mainstem bronchus, as dictated in the original report. Retracting approximately 3-4 cm should better position the tip above the carina. Critical Value/emergent results were called by telephone at the time of interpretation on 08/27/2016 at 7:55 pm to [facility], who verbally acknowledged these results. Electronically Signed   By: Lorin Picket M.D.   On: 08/27/2016 19:56   Result Date: 08/27/2016 CLINICAL DATA:  Cardiac arrest. EXAM: CT ANGIOGRAPHY CHEST CT ABDOMEN AND PELVIS WITH CONTRAST TECHNIQUE: Multidetector CT imaging of the chest was performed using the standard protocol during bolus administration of intravenous contrast. Multiplanar CT image reconstructions and MIPs were obtained to evaluate the vascular anatomy. Multidetector CT imaging of the abdomen and pelvis was performed using the standard protocol during bolus administration of intravenous contrast. CONTRAST:  100 cc Isovue 370. COMPARISON:  Report of MR abdomen 03/03/2003 and CT abdomen 03/02/2003 are reviewed. Images are not available. FINDINGS: CTA CHEST FINDINGS Cardiovascular: Image quality is degraded by respiratory motion. No large central pulmonary embolus. Lobar, segmental and subsegmental pulmonary arteries cannot be evaluate. Atherosclerotic calcification of the arterial vasculature, including coronary arteries. Pulmonary arteries and heart are enlarged. No pericardial effusion. Mediastinum/Nodes: Mediastinal lymph nodes measure up to 11 mm in the low right paratracheal and prevascular regions. No definite hilar lymph nodes.  Axillary lymph nodes are incompletely imaged and measure up to highly 13 mm. Nasogastric tube is seen within the esophagus, terminating in the stomach. Lungs/Pleura: Image quality is degraded by respiratory motion. Endotracheal tube terminates in the right mainstem bronchus. Basilar dependent patchy airspace consolidation. No definite pleural fluid. Musculoskeletal: No worrisome lytic or sclerotic lesions. Degenerative changes are seen in the spine. Flowing anterior osteophytosis in the thoracic spine. Review of the MIP images confirms the above findings. CT ABDOMEN and PELVIS FINDINGS Hepatobiliary: A 4.9 x 7.2 cm low-attenuation lesion in the dome of the liver appears to have contrast puddling but is not imaged on the delayed nephrographic phase series. Mildly heterogeneous low-attenuation lesion in the left hepatic lobe measures 5.1 x 5.5 cm. Multiple lesions characterized hemangiomas were reported on examination of 03/03/2003. Images are not available at the time dictation. Today's measurements are larger than on that report. Additional smaller lesions are seen in the caudate and inferior right hepatic lobe. Gallbladder is unremarkable. No biliary ductal dilatation. Pancreas: Negative. Spleen: Negative. Adrenals/Urinary Tract: Adrenal glands are unremarkable. Sub cm low-attenuation lesion in the right kidney is too small to characterize. Ureters are decompressed. Foley catheter and air  are seen in a decompressed bladder. Bladder wall appears thickened. Stomach/Bowel: Nasogastric terminates in the stomach. Stomach, small bowel, appendix and colon are unremarkable. Vascular/Lymphatic: Atherosclerotic calcification of the arterial vasculature without abdominal aortic aneurysm. Portacaval lymph node measures 1.5 cm. Reproductive: Prostate is visualized and contains calcifications. Other: No free fluid. Tiny periumbilical hernia contains fat. Mesenteries and peritoneum are unremarkable. Musculoskeletal: No worrisome  lytic or sclerotic lesions. Degenerative changes are seen in the spine and hips. IMPRESSION: 1. Poor assessment of the lobar, segmental and subsegmental pulmonary arteries due to excessive respiratory motion. No definite central pulmonary embolus. 2. Dependent pulmonary parenchymal collapse/consolidation bilaterally. Aspiration and pneumonia are not excluded. 3. Hepatic lesions, previously characterized as hemangiomas on 03/03/2003. Images are not available for comparison. Some lesions measure larger on today's study. If further assessment is desired, MR abdomen without and with contrast is recommended, when the patient is clinically stable and able to hold his breath, preferably as an outpatient. 4. Bladder wall appears thickened. 5.  Aortic atherosclerosis (ICD10-170.0). Electronically Signed: By: Lorin Picket M.D. On: 08/27/2016 16:37   Ct Abdomen Pelvis W Contrast  Addendum Date: 08/27/2016   ADDENDUM REPORT: 08/27/2016 19:56 ADDENDUM: Endotracheal tube terminates in the right mainstem bronchus, as dictated in the original report. Retracting approximately 3-4 cm should better position the tip above the carina. Critical Value/emergent results were called by telephone at the time of interpretation on 08/27/2016 at 7:55 pm to [facility], who verbally acknowledged these results. Electronically Signed   By: Lorin Picket M.D.   On: 08/27/2016 19:56   Result Date: 08/27/2016 CLINICAL DATA:  Cardiac arrest. EXAM: CT ANGIOGRAPHY CHEST CT ABDOMEN AND PELVIS WITH CONTRAST TECHNIQUE: Multidetector CT imaging of the chest was performed using the standard protocol during bolus administration of intravenous contrast. Multiplanar CT image reconstructions and MIPs were obtained to evaluate the vascular anatomy. Multidetector CT imaging of the abdomen and pelvis was performed using the standard protocol during bolus administration of intravenous contrast. CONTRAST:  100 cc Isovue 370. COMPARISON:  Report of MR abdomen  03/03/2003 and CT abdomen 03/02/2003 are reviewed. Images are not available. FINDINGS: CTA CHEST FINDINGS Cardiovascular: Image quality is degraded by respiratory motion. No large central pulmonary embolus. Lobar, segmental and subsegmental pulmonary arteries cannot be evaluate. Atherosclerotic calcification of the arterial vasculature, including coronary arteries. Pulmonary arteries and heart are enlarged. No pericardial effusion. Mediastinum/Nodes: Mediastinal lymph nodes measure up to 11 mm in the low right paratracheal and prevascular regions. No definite hilar lymph nodes. Axillary lymph nodes are incompletely imaged and measure up to highly 13 mm. Nasogastric tube is seen within the esophagus, terminating in the stomach. Lungs/Pleura: Image quality is degraded by respiratory motion. Endotracheal tube terminates in the right mainstem bronchus. Basilar dependent patchy airspace consolidation. No definite pleural fluid. Musculoskeletal: No worrisome lytic or sclerotic lesions. Degenerative changes are seen in the spine. Flowing anterior osteophytosis in the thoracic spine. Review of the MIP images confirms the above findings. CT ABDOMEN and PELVIS FINDINGS Hepatobiliary: A 4.9 x 7.2 cm low-attenuation lesion in the dome of the liver appears to have contrast puddling but is not imaged on the delayed nephrographic phase series. Mildly heterogeneous low-attenuation lesion in the left hepatic lobe measures 5.1 x 5.5 cm. Multiple lesions characterized hemangiomas were reported on examination of 03/03/2003. Images are not available at the time dictation. Today's measurements are larger than on that report. Additional smaller lesions are seen in the caudate and inferior right hepatic lobe. Gallbladder is unremarkable. No biliary  ductal dilatation. Pancreas: Negative. Spleen: Negative. Adrenals/Urinary Tract: Adrenal glands are unremarkable. Sub cm low-attenuation lesion in the right kidney is too small to characterize.  Ureters are decompressed. Foley catheter and air are seen in a decompressed bladder. Bladder wall appears thickened. Stomach/Bowel: Nasogastric terminates in the stomach. Stomach, small bowel, appendix and colon are unremarkable. Vascular/Lymphatic: Atherosclerotic calcification of the arterial vasculature without abdominal aortic aneurysm. Portacaval lymph node measures 1.5 cm. Reproductive: Prostate is visualized and contains calcifications. Other: No free fluid. Tiny periumbilical hernia contains fat. Mesenteries and peritoneum are unremarkable. Musculoskeletal: No worrisome lytic or sclerotic lesions. Degenerative changes are seen in the spine and hips. IMPRESSION: 1. Poor assessment of the lobar, segmental and subsegmental pulmonary arteries due to excessive respiratory motion. No definite central pulmonary embolus. 2. Dependent pulmonary parenchymal collapse/consolidation bilaterally. Aspiration and pneumonia are not excluded. 3. Hepatic lesions, previously characterized as hemangiomas on 03/03/2003. Images are not available for comparison. Some lesions measure larger on today's study. If further assessment is desired, MR abdomen without and with contrast is recommended, when the patient is clinically stable and able to hold his breath, preferably as an outpatient. 4. Bladder wall appears thickened. 5.  Aortic atherosclerosis (ICD10-170.0). Electronically Signed: By: Lorin Picket M.D. On: 08/27/2016 16:37   Dg Chest Port 1 View  Result Date: 08/27/2016 CLINICAL DATA:  Endotracheal tube placement.  Initial encounter. EXAM: PORTABLE CHEST 1 VIEW COMPARISON:  CTA of the chest performed earlier today at 3:47 p.m. FINDINGS: The patient's endotracheal tube is seen ending 1-2 cm above the carina. This could be retracted 1-2 cm. An enteric tube is noted extending below the diaphragm. Bilateral airspace opacification is noted, with underlying vascular congestion. This may reflect pulmonary edema or pneumonia. No  pleural effusion or pneumothorax is seen. The cardiomediastinal silhouette is enlarged. No acute osseous abnormalities are seen. An external pacing pad is noted. IMPRESSION: 1. Endotracheal tube seen ending 1-2 cm above the carina. This could be retracted 1-2 cm. 2. Bilateral airspace opacification, with underlying vascular congestion. This may reflect pulmonary edema or pneumonia. 3. Cardiomegaly. Electronically Signed   By: Garald Balding M.D.   On: 08/27/2016 21:42   Dg Chest Portable 1 View  Result Date: 08/27/2016 CLINICAL DATA:  Central line placement. EXAM: PORTABLE CHEST 1 VIEW COMPARISON:  08/27/2016. FINDINGS: Stable enlarged cardiac silhouette. Endotracheal tube tip 1.3 cm above the carina and directed toward the right mainstem bronchus. Left jugular catheter tip in the proximal superior vena cava. No pneumothorax. Nasogastric tube extending into the stomach. Significantly less bilateral airspace opacity. Decreased size of a small left pleural effusion. The pulmonary vasculature and interstitial markings remain mildly prominent. Thoracic spine degenerative changes. IMPRESSION: 1. Left jugular catheter tip in the superior vena cava without pneumothorax. 2. Significantly improved bilateral alveolar edema. 3. Small left pleural effusion, also improved. 4. Stable cardiomegaly, pulmonary vascular congestion and probable mild interstitial pulmonary edema. 5. Low position of the endotracheal tube. It is recommended that this be retracted 6 cm. Electronically Signed   By: Claudie Revering M.D.   On: 08/27/2016 15:16   Dg Chest Portable 1 View  Result Date: 08/27/2016 CLINICAL DATA:  65 year old male with history of syncopal event at home. CPR performed. Patient now intubated. EXAM: PORTABLE CHEST 1 VIEW COMPARISON:  Chest x-ray 06/04/2015. FINDINGS: An endotracheal tube is in place with tip 1.5 cm above the carina. A nasogastric tube is seen extending into the stomach, however, the tip of the nasogastric tube  extends below the lower  margin of the image. Transcutaneous defibrillator pads projecting over the left hemithorax. Lung volumes are low. Patchy multifocal interstitial and airspace disease asymmetrically distributed in the lungs bilaterally. Probable small left pleural effusion which may be loculated laterally. No right pleural effusion. Pulmonary vasculature is obscured. Heart size appears mildly enlarged. Upper mediastinal contours are partially obscured by airspace disease and slightly distorted by patient's rotation to the left. IMPRESSION: 1. Support apparatus, as above. 2. Multifocal interstitial and airspace disease is slightly asymmetric. While this could represent underlying cardiogenic or noncardiogenic edema, findings may also reflect multilobar pneumonia or sequela of recent aspiration. 3. Probable small partially loculated left pleural effusion at the base of the left hemithorax. Attention on followup studies is recommended. Should this fail to resolve, further evaluation with nonemergent contrast enhanced chest CT should be considered to exclude underlying mass. Electronically Signed   By: Vinnie Langton M.D.   On: 08/27/2016 13:14     STUDIES:    CULTURES: 12/3 bc>> 12/3 sputum>> 12/3 urine>>  ANTIBIOTICS: None  SIGNIFICANT EVENTS: Cardiac arrest 12/3  LINES/TUBES: 12/3 ET>> 12/3 left I J CVL>>  DISCUSSION: 65 yo MO (310 lbs 5'5") AAM who was walking back from the bathroom 12/3 when he collapsed pulseless. CPR wa not performed by family and EMS arrived in 10 minutes and performed CPR x 21 minutes, 7 rounds of epi, 5 shocks, amiodarone and magnesium. He coded again with ROSC. King airway was changed by EDP. He was taken to CT scanner with NAD of brain, transfer to CCU and cardiology was called. Hypothermia protocol was initiated at 1545 after ct head. His prognosis for meaningful recover is poor.  ASSESSMENT / PLAN:  PULMONARY A: Post cardiac arrest 12/3 with 31 minutes  down Hx of Asthma P:   PRVC 8cc/kg Follow ABG and CXR\ Assess for potential for PSV once rewarmed and MS determined.   CARDIOVASCULAR A:  VT / VF arrest, ? Secondary to resp process P:  Hypothermia protocol, T 36C Cardiology following TTE pending Defer L cath for now, reconsider depending on his neuro recovery  RENAL A:   ARI P:   Avoid nephrotoxins Monitor BMP and UOP with current support  GASTROINTESTINAL A:   GI protection P:   PPI  HEMATOLOGIC A:   DVT prophylaxis P:  Heparin  Intermittent CBC   INFECTIOUS A:   No overt infection P:   Follow cx data off abx  ENDOCRINE A:   DM P:   SSI  NEUROLOGIC A:   Post Cardiac arrest CT head appears negative Sedation/NMB for hypothermia P:   RASS goal:-1 Sedation protocol Assess wakeup potential after rewarmed   FAMILY  - Updates:  Updated per Dr. Nelda Marseille 12/3, by Dr Lamonte Sakai 12/4  - Inter-disciplinary family meet or Palliative Care meeting due by: 09/03/16   Independent CC time 51 minutes   Baltazar Apo, MD, PhD 08/28/2016, 11:18 AM [facility] [phone] or if no answer [phone]

## 2016-08-28 NOTE — Progress Notes (Signed)
eLink Physician-Brief Progress Note Patient Name: Dean [name] DOB: 1950-09-27 MRN: [id number]   Date of Service  08/28/2016  HPI/Events of Note    eICU Interventions  Add APAP to keep temp down     Intervention Category Minor Interventions: Routine modifications to care plan (e.g. PRN medications for pain, fever)  Simonne Maffucci 08/28/2016, 11:40 PM

## 2016-08-28 NOTE — Progress Notes (Signed)
Initial Nutrition Assessment  DOCUMENTATION CODES:   Morbid obesity  INTERVENTION:   Once pt is re-warmed, recommend:  Initiate TF via OGT with Vital High Protein at goal rate of 35 ml/h (840 ml per day) and Prostat 60 ml TID to provide 1440 kcals, 164 gm protein, 702 ml free water daily.  NUTRITION DIAGNOSIS:   Inadequate oral intake related to inability to eat as evidenced by NPO status.  GOAL:   Provide needs based on ASPEN/SCCM guidelines  MONITOR:   Vent status, Labs, Weight trends, Skin, I & O's  REASON FOR ASSESSMENT:   Ventilator    ASSESSMENT:   65 yo MO (310 lbs 5'5") AAM who was walking back from the bathroom 12/3 when he collapsed pulseless. CPR wa not performed by family and EMS arrived in 10 minutes and performed CPR x 21 minutes, 7 rounds of epi, 5 shocks, amiodarone and magnesium. He coded again with ROSC. King airway was changed by EDP. He was taken to CT scanner with NAD of brain, transfer to CCU and cardiology was called. Hypothermia protocol was initiated at 1545 after ct head. His prognosis for meaningful recover is poor.  Pt admitted with s/p arrest. Per MD notes, with probable anoxic brain injury.   Patient is currently intubated on ventilator support.  MV: 9.7 L/min Temp (24hrs), Avg:97.7 F (36.5 C), Min:95.5 F (35.3 C), Max:99.9 F (37.7 C)  Hypothermia protocol initiated on 08/27/16. Current temp 35.9 C.   OGT currently connected to low intermittent suction; noted 200 ml output within the p[ast 24 hours.   Nutrition-Focused physical exam completed. Findings are no fat depletion, no muscle depletion, and no edema.   Medications reviewed and include nimbex and versed.   Labs reviewed: CBGS: 89-114.  Diet Order:  Diet NPO time specified  Skin:  Reviewed, no issues  Last BM:  PTA  Height:   Ht Readings from Last 1 Encounters:  08/27/16 5\' 5"  (1.651 m)    Weight:   Wt Readings from Last 1 Encounters:  08/27/16 (!) 310 lb (140.6  kg)    Ideal Body Weight:  61.8 kg  BMI:  Body mass index is 51.59 kg/m.  Estimated Nutritional Needs:   Kcal:  [id number]  Protein:  > 155 grams  Fluid:  > 1.3 L  EDUCATION NEEDS:   No education needs identified at this time  Curley Hogen A. Jimmye Norman, RD, LDN, CDE Pager: [phone] After hours Pager: [phone]

## 2016-08-28 NOTE — Progress Notes (Signed)
McQuaid, MD called and made aware of water temp of 6 degree celsius. No new orders given at this time. Will continue to monitor patient.

## 2016-08-28 NOTE — Procedures (Signed)
ELECTROENCEPHALOGRAM REPORT  Date of Study: 08/28/16  Patient's Name: Dean [name] MRN: [id number] Date of Birth: 06-11-1951  Clinical History: 65 y/o with CP arrest with prolonged arrest time  Technical Summary: A multichannel digital EEG recording measured by the international 10-20 system with electrodes applied with paste and impedances below 5000 ohms performed in our laboratory with EKG monitoring in an intubated and sedated patient.  Hyperventilation and photic stimulation were not performed.  The digital EEG was referentially recorded, reformatted, and digitally filtered in a variety of bipolar and referential montages for optimal display.    Description: The patient is intubated and sedated, on fentanyl and versed sedation during the recording. There is loss of normal background activity. The records read at a sensitivity of 3 uV/mm shows diffuse suppression of background activity. There is no spontaneous reactivity or reactivity noted with noxious stimulation. There is significant artifact throughout the recording. Hyperventilation and photic stimulation were not performed. There were no epileptiform discharges or electrographic seizures seen.   Impression: This EEG is markedly abnormal due to diffuse background suppression and lack of EEG reactivity with noxious stimulation.  There is significant artifact throughout the recording making some interpretation difficult but no obvious epileptiform discharges noted.    Clinical Correlation of the above findings indicates severe diffuse cerebral dysfunction that is non-specific in etiology and can be seen in the setting of anoxic/ischemic injury, toxic/metabolic encephalopathies, or medication effect. In the absence of CNS active, sedating, or anesthetic medications, this suggests a poor prognosis. Clinical correlation is advised.

## 2016-08-29 ENCOUNTER — Inpatient Hospital Stay (HOSPITAL_COMMUNITY): Payer: Medicare Other

## 2016-08-29 DIAGNOSIS — J9600 Acute respiratory failure, unspecified whether with hypoxia or hypercapnia: Secondary | ICD-10-CM

## 2016-08-29 DIAGNOSIS — I255 Ischemic cardiomyopathy: Secondary | ICD-10-CM

## 2016-08-29 DIAGNOSIS — J9601 Acute respiratory failure with hypoxia: Secondary | ICD-10-CM

## 2016-08-29 DIAGNOSIS — I214 Non-ST elevation (NSTEMI) myocardial infarction: Secondary | ICD-10-CM

## 2016-08-29 LAB — GLUCOSE, CAPILLARY
Glucose-Capillary: 105 mg/dL — ABNORMAL HIGH (ref 65–99)
Glucose-Capillary: 114 mg/dL — ABNORMAL HIGH (ref 65–99)
Glucose-Capillary: 82 mg/dL (ref 65–99)
Glucose-Capillary: 91 mg/dL (ref 65–99)
Glucose-Capillary: 91 mg/dL (ref 65–99)
Glucose-Capillary: 95 mg/dL (ref 65–99)
Glucose-Capillary: 97 mg/dL (ref 65–99)

## 2016-08-29 LAB — POCT I-STAT 3, ART BLOOD GAS (G3+)
Acid-base deficit: 4 mmol/L — ABNORMAL HIGH (ref 0.0–2.0)
Bicarbonate: 21.9 mmol/L (ref 20.0–28.0)
O2 Saturation: 97 %
Patient temperature: 98.2
TCO2: 23 mmol/L (ref 0–100)
pCO2 arterial: 39.7 mmHg (ref 32.0–48.0)
pH, Arterial: 7.349 — ABNORMAL LOW (ref 7.350–7.450)
pO2, Arterial: 95 mmHg (ref 83.0–108.0)

## 2016-08-29 LAB — BASIC METABOLIC PANEL
Anion gap: 8 (ref 5–15)
BUN: 12 mg/dL (ref 6–20)
CO2: 23 mmol/L (ref 22–32)
Calcium: 8.9 mg/dL (ref 8.9–10.3)
Chloride: 109 mmol/L (ref 101–111)
Creatinine, Ser: 1.17 mg/dL (ref 0.61–1.24)
GFR calc Af Amer: >60 mL/min (ref >60)
GFR calc non Af Amer: >60 mL/min (ref >60)
Glucose, Bld: 102 mg/dL — ABNORMAL HIGH (ref 65–99)
Potassium: 4.3 mmol/L (ref 3.5–5.1)
Sodium: 140 mmol/L (ref 135–145)

## 2016-08-29 LAB — PHOSPHORUS: Phosphorus: 3.1 mg/dL (ref 2.5–4.6)

## 2016-08-29 LAB — PROTIME-INR
INR: 1.65
Prothrombin Time: 19.7 seconds — ABNORMAL HIGH (ref 11.4–15.2)

## 2016-08-29 LAB — CBC
HCT: 34.4 % — ABNORMAL LOW (ref 39.0–52.0)
Hemoglobin: 11.1 g/dL — ABNORMAL LOW (ref 13.0–17.0)
MCH: 27.3 pg (ref 26.0–34.0)
MCHC: 32.3 g/dL (ref 30.0–36.0)
MCV: 84.5 fL (ref 78.0–100.0)
Platelets: 115 10*3/uL — ABNORMAL LOW (ref 150–400)
RBC: 4.07 MIL/uL — ABNORMAL LOW (ref 4.22–5.81)
RDW: 13.8 % (ref 11.5–15.5)
WBC: 10.3 10*3/uL (ref 4.0–10.5)

## 2016-08-29 LAB — PROCALCITONIN: Procalcitonin: 1.26 ng/mL

## 2016-08-29 LAB — MAGNESIUM: Magnesium: 1.6 mg/dL — ABNORMAL LOW (ref 1.7–2.4)

## 2016-08-29 LAB — LACTIC ACID, PLASMA: Lactic Acid, Venous: 1.4 mmol/L (ref 0.5–1.9)

## 2016-08-29 MED ORDER — FUROSEMIDE 10 MG/ML IJ SOLN
40.0000 mg | Freq: Four times a day (QID) | INTRAMUSCULAR | Status: DC
  Administered 2016-08-29 – 2016-08-31 (×8): 40 mg via INTRAVENOUS
  Filled 2016-08-29 (×8): qty 4

## 2016-08-29 MED ORDER — MAGNESIUM SULFATE 2 GM/50ML IV SOLN
2.0000 g | Freq: Once | INTRAVENOUS | Status: AC
Start: 2016-08-29 — End: 2016-08-29
  Administered 2016-08-29: 2 g via INTRAVENOUS
  Filled 2016-08-29: qty 50

## 2016-08-29 NOTE — Progress Notes (Signed)
Subjective: Doing better this am.   Exam: Vitals:   08/29/16 1230 08/29/16 1300  BP: (!) 146/43 (!) 153/66  Pulse: 79 93  Resp: (!) 23 (!) 23  Temp:     Gen: In bed, NAD Resp: non-labored breathing, no acute distress Abd: soft, nt  Neuro: MS: does not follow commands, he does fixate and track.  [id number], eyes conjugate once stimulated and the patient fixates. Motor: voluntary spontaneous movement of all four extremities.  Sensory: as above.   Pertinent Labs: Elevated creatinine  Impression: 65 yo M s/p cardiac arrest. He appears to be markedly improved with consciousness, though not following commands. No evidence of seizure. His EEG is not predictive of poor prognosis.   Recommendations: 1) will follow.   Roland Rack, MD [facility] [phone]  If 7pm- 7am, please page neurology on call as listed in [place].

## 2016-08-29 NOTE — Progress Notes (Signed)
Patient Name: Dean [name] Date of Encounter: 08/29/2016  Principal Problem:   Cardiopulmonary arrest [facility]) Active Problems:   DM2 (diabetes mellitus, type 2) ([place])   Morbid obesity ([place])   Essential hypertension, benign   Acute encephalopathy   Length of Stay: 2  SUBJECTIVE  Off sedation this am at 7:30, rewarmed, opens eyes to painful stimuli but doesn't follow commands.   CURRENT MEDS . chlorhexidine gluconate (MEDLINE KIT)  15 mL Mouth Rinse BID  . famotidine (PEPCID) IV  20 mg Intravenous Q12H  . fentaNYL (SUBLIMAZE) injection  50 mcg Intravenous Once  . furosemide  20 mg Intravenous Q6H  . heparin  5,000 Units Subcutaneous Q8H  . insulin aspart  0-15 Units Subcutaneous Q4H  . mouth rinse  15 mL Mouth Rinse 10 times per day  . sodium chloride flush  10-40 mL Intracatheter Q12H   . sodium chloride 75 mL/hr at 08/29/16 0024  . sodium chloride    . fentaNYL infusion INTRAVENOUS Stopped (08/29/16 0730)  . midazolam (VERSED) infusion Stopped (08/29/16 0550)  . norepinephrine (LEVOPHED) Adult infusion Stopped (08/27/16 2106)   OBJECTIVE  Vitals:   08/29/16 0751 08/29/16 0800 08/29/16 0830 08/29/16 0900  BP:  (!) 180/64 (!) 166/70 (!) 158/45  Pulse: (!) 103 (!) 107 (!) 106 98  Resp: (!) _0 Temp:  99.7 F (37.6 C)    TempSrc:  Core (Comment)    SpO2: 95% 94% 96% 97%  Weight:      Height:        Intake/Output Summary (Last 24 hours) at 08/29/16 1015 Last data filed at 08/29/16 0945  Gross per 24 hour  Intake          2652.81 ml  Output             3215 ml  Net          -562.19 ml   Filed Weights   08/27/16 1251  Weight: (!) 310 lb (140.6 kg)    PHYSICAL EXAM  General: intubated Neck: Supple without bruits or JVD. Lungs:  Resp regular and unlabored, crackles B/L Heart: RRR no s3, s4, or murmurs. Abdomen: Soft, non-tender, non-distended, BS + x 4.  Extremities: No clubbing, cyanosis or edema. DP/PT/Radials 2+ and equal  bilaterally.  Accessory Clinical Findings  CBC  Recent Labs  08/27/16 1312  08/27/16 1839 08/29/16 0350  WBC 22.1*  --   --  10.3  HGB 12.7*  < > 12.9* 11.1*  HCT 39.9  < > 38.0* 34.4*  MCV 85.3  --   --  84.5  PLT 238  --   --  115*  < > = values in this interval not displayed. Basic Metabolic Panel  Recent Labs  08/28/16 1146 08/29/16 0350  NA 140 140  K 3.9 4.3  CL 109 109  CO2 23 23  GLUCOSE 92 102*  BUN 19 12  CREATININE 1.29* 1.17  CALCIUM 8.9 8.9  MG  --  1.6*  PHOS  --  3.1    Recent Labs  08/27/16 1312  LIPASE 16   Cardiac Enzymes  Recent Labs  08/27/16 2011 08/28/16 0400 08/28/16 1011  TROPONINI 2.61* 2.73* 1.99*    Recent Labs  08/27/16 1705  HGBA1C 5.5   Radiology/Studies  Ct Head Wo Contrast  Result Date: 08/27/2016 CLINICAL DATA:  POST CPRwitnessed cardiac arrest.  IMPRESSION: 1. No acute intracranial abnormalities. 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 Electronically Signed  By: Lajean Manes M.D.   On: 08/27/2016 16:15   Ct Angio Chest Pe W And/or Wo Contrast  Addendum Date: 08/27/2016   ADDENDUM REPORT: 08/27/2016 19:56 ADDENDUM: Endotracheal tube terminates in the right mainstem bronchus, as dictated in the original report. Retracting approximately 3-4 cm should better position the tip above the carina. Critical Value/emergent results were called by telephone at the time of interpretation on 08/27/2016 at 7:55 pm to [facility], who verbally acknowledged these results. Electronically Signed   By: Lorin Picket M.D.   On: 08/27/2016 19:56   Result Date: 08/27/2016 CLINICAL DATA:  Cardiac arrest. EXAM: CT ANGIOGRAPHY CHEST CT ABDOMEN AND PELVIS WITH CONTRAST  IMPRESSION: 1. Poor assessment of the lobar, segmental and subsegmental pulmonary arteries due to excessive respiratory motion. No definite central pulmonary embolus. 2. Dependent pulmonary parenchymal collapse/consolidation bilaterally. Aspiration  and pneumonia are not excluded. 3. Hepatic lesions, previously characterized as hemangiomas on 03/03/2003. Images are not available for comparison. Some lesions measure larger on today's study. If further assessment is desired, MR abdomen without and with contrast is recommended, when the patient is clinically stable and able to hold his breath, preferably as an outpatient. 4. Bladder wall appears thickened. 5.  Aortic atherosclerosis (ICD10-170.0). Electronically Signed: By: Lorin Picket M.D. On: 08/27/2016 16:37   Dg Chest Port 1 View  Result Date: 08/27/2016 CLINICAL DATA:  Endotracheal tube placement.  Initial encounter. EXAM: PORTABLE CHEST 1 VIEW COMPARISON:  CTA of the chest performed earlier today at 3:47 p.m. FINDINGS: The patient's endotracheal tube is seen ending 1-2 cm above the carina. This could be retracted 1-2 cm. An enteric tube is noted extending below the diaphragm. Bilateral airspace opacification is noted, with underlying vascular congestion. This may reflect pulmonary edema or pneumonia. No pleural effusion or pneumothorax is seen. The cardiomediastinal silhouette is enlarged. No acute osseous abnormalities are seen. An external pacing pad is noted. IMPRESSION: 1. Endotracheal tube seen ending 1-2 cm above the carina. This could be retracted 1-2 cm. 2. Bilateral airspace opacification, with underlying vascular congestion. This may reflect pulmonary edema or pneumonia. 3.  On: 08/27/2016 15:16   TELE: PVCs, couplets  ECG: SR, LBBB  TTE: 08/28/2016 - Left ventricle: The cavity size was mildly dilated. Wall   thickness was increased in a pattern of severe LVH. Systolic   function was moderately reduced. The estimated ejection fraction   was in the range of 35% to 40%. Akinesis of the basalinferior and   apical myocardium. Features are consistent with a pseudonormal   left ventricular filling pattern, with concomitant abnormal   relaxation and increased filling pressure (grade 2  diastolic   dysfunction). - Aortic valve: There was mild regurgitation. - Mitral valve: There was mild regurgitation. - Left atrium: The atrium was severely dilated.    ASSESSMENT AND PLAN  1. S/P cardiac arrest, minimal troponin elevation, most probably sec to shock applied by EMS for VT/VF, ECG shows unchanged LBBB since 2014, TTE shows pattern of severe LVH. Systolic function was moderately reduced. The estimated ejection fraction was in the range of 35% to 40%. Akinesis of the basalinferior and apical myocardium. Rewarmed, off pressors, now hypertensive. CT head negative for stroke, EEG show no seizure activity. Ischemia workup once patient recovers.   2. Acute CHF - we will d/c iv fluids and increase lasix to 40 mg iv q6h, crea 1.29 --> 1.17.  Signed, Ena Dawley MD, [facility] 08/29/2016

## 2016-08-29 NOTE — Progress Notes (Signed)
PULMONARY / CRITICAL CARE MEDICINE   Name: Dean [name] MRN: [id number] DOB: 1951/06/23    ADMISSION DATE:  08/27/2016   REFERRING MD:  EDP  CHIEF COMPLAINT:   Post arrest   HISTORY OF PRESENT ILLNESS:   65 yo MO (310 lbs 5'5") AAM who was walking back from the bathroom 12/3 when he collapsed pulseless. CPR wa not performed by family and EMS arrived in 10 minutes and performed CPR x 21 minutes, 7 rounds of epi, 5 shocks, amiodarone and magnesium. He coded again with ROSC. King airway was changed by EDP. He was taken to CT scanner with NAD of brain, transfer to CCU and cardiology was called. Hypothermia protocol was initiated at 1545 after ct head. His prognosis for meaningful recover is poor.  SUBJECTIVE:  Warming. Off Sedation. Able to track   VITAL SIGNS: BP (!) 140/50   Pulse 82   Temp 99.7 F (37.6 C) (Core (Comment))   Resp (!) 35   Ht 5\' 5"  (1.651 m)   Wt (!) 140.6 kg (310 lb)   SpO2 97%   BMI 51.59 kg/m   HEMODYNAMICS: CVP:  [6 mmHg-12 mmHg] 11 mmHg  VENTILATOR SETTINGS: Vent Mode: CPAP;PSV FiO2 (%):  [40 %-60 %] 40 % Set Rate:  [18 bmp] 18 bmp Vt Set:  [580 mL] 580 mL PEEP:  [5 cmH20] 5 cmH20 Pressure Support:  [10 cmH20] 10 cmH20 Plateau Pressure:  [24 cmH20-28 cmH20] 27 cmH20  INTAKE / OUTPUT: I/O last 3 completed shifts: In: 4208.8 [I.V.:3683.8; NG/GT:110; IV Piggyback:415] Out: [id number] [Urine:4255; Emesis/NG output:200]  PHYSICAL EXAMINATION: General:  Adult male, on vent Neuro:  Moves extremities with physical stimulation, opens eyes and tracks  HEENT:  No neck, ETT in place Cardiovascular:  RRR, S1/S2, no MRG Lungs: Diffuse rhonchi throughout  Abdomen: Obese soft  Musculoskeletal: no deformities  Skin: dry, intact   LABS:  BMET  Recent Labs Lab 08/27/16 2200 08/28/16 1146 08/29/16 0350  NA 139 140 140  K 4.0 3.9 4.3  CL 107 109 109  CO2 25 23 23   BUN 16 19 12   CREATININE 1.31* 1.29* 1.17  GLUCOSE 126* 92 102*     Electrolytes  Recent Labs Lab 08/27/16 2200 08/28/16 1146 08/29/16 0350  CALCIUM 9.4 8.9 8.9  MG  --   --  1.6*  PHOS  --   --  3.1    CBC  Recent Labs Lab 08/27/16 1312 08/27/16 1622 08/27/16 1839 08/29/16 0350  WBC 22.1*  --   --  10.3  HGB 12.7* 12.6* 12.9* 11.1*  HCT 39.9 37.0* 38.0* 34.4*  PLT 238  --   --  115*    Coag's  Recent Labs Lab 08/27/16 1705 08/28/16 0011 08/29/16 0350  APTT 26 34  --   INR 1.13 1.17 1.65    Sepsis Markers  Recent Labs Lab 08/27/16 1326 08/27/16 1937 08/28/16 0400 08/29/16 0350  LATICACIDVEN 5.88*  --   --  1.4  PROCALCITON  --  1.26 1.85 1.26    ABG  Recent Labs Lab 08/27/16 1303 08/27/16 1732 08/29/16 0427  PHART 7.229* 7.538* 7.349*  PCO2ART 55.4* 26.2* 39.7  PO2ART 24.0* 207.0* 95.0    Liver Enzymes No results for input(s): AST, ALT, ALKPHOS, BILITOT, ALBUMIN in the last 168 hours.  Cardiac Enzymes  Recent Labs Lab 08/27/16 2011 08/28/16 0400 08/28/16 1011  TROPONINI 2.61* 2.73* 1.99*    Glucose  Recent Labs Lab 08/28/16 1631 08/28/16 1728 08/28/16 2111 08/29/16 0034 08/29/16 [id number]  08/29/16 0825  GLUCAP 72 72 77 82 91 114*    Imaging Dg Chest Port 1 View  Result Date: 08/29/2016 CLINICAL DATA:  Acute respiratory failure, shortness of breath EXAM: PORTABLE CHEST 1 VIEW COMPARISON:  August 27, 2016 FINDINGS: The heart size and mediastinal contours are stable. The heart size is enlarged. Endotracheal tube is identified distal tip 2.5 cm from carina. Nasogastric tube is identified distal tip not included on film but is at least in the stomach. There are patchy consolidations throughout bilateral lungs not changed. The visualized skeletal structures are stable. IMPRESSION: Bilateral airspace consolidation in the lungs unchanged compared to prior exam. Cardiomegaly. Endotracheal tube is identified distal tip 2.5 cm from carina. Electronically Signed   By: Abelardo Diesel M.D.   On: 08/29/2016  08:01     STUDIES:  Echo > EF 35-40% grade 2 DD  CULTURES: 12/3 bc>> 12/3 sputum>> 12/3 urine>>  ANTIBIOTICS:  SIGNIFICANT EVENTS: Cardiac arrest 12/3 12/5 > Re-Warmed, off sedation   LINES/TUBES: 12/3 ET>> 12/3 left I J CVL>>  DISCUSSION: 65 yo MO (310 lbs 5'5") AAM who was walking back from the bathroom 12/3 when he collapsed pulseless. CPR wa not performed by family and EMS arrived in 10 minutes and performed CPR x 21 minutes, 7 rounds of epi, 5 shocks, amiodarone and magnesium. He coded again with ROSC. King airway was changed by EDP. He was taken to CT scanner with NAD of brain, transfer to CCU and cardiology was called. Hypothermia protocol was initiated at 1545 after ct head. His prognosis for meaningful recover is poor.  ASSESSMENT / PLAN:  PULMONARY A: Post cardiac arrest 12/3 with 31 minutes down Hx of Asthma P:   PRVC 8cc/kg Wean as tolerated  Daily CXR Mental Status barrier to extubation   CARDIOVASCULAR A:  VT / VF arrest, ? Secondary to resp process Diastolic HF - grade 2 diastolic dysfunction ef [id number] P:  Cardiology following Maintain MAP >65 Lasix per Cards  Defer L cath for now, reconsider depending on his neuro recovery  RENAL A:   ARI P:   Avoid nephrotoxins Monitor BMP and UOP with current support Lasix per Cards   GASTROINTESTINAL A:   GI protection P:   PPI  HEMATOLOGIC A:   DVT prophylaxis P:  Heparin  Trend CBC   INFECTIOUS A:   No overt infection P:   Follow cx data off abx Trend fever and WBC curve   ENDOCRINE A:   DM P:   SSI  NEUROLOGIC A:   Post Cardiac arrest CT head appears negative Sedation/NMB for hypothermia P:   RASS goal:-1 D/C versed  PRN fentanyl  Neurology following    FAMILY  - Updates:  Updated per Dr. Nelda Marseille 12/3, by Dr Lamonte Sakai 12/4 and 5. Family agrees for family meeting Friday, pending neuro status.   - Inter-disciplinary family meet or Palliative Care meeting due by:  09/03/16   Independent CC time 31 minutes  Hayden Pedro, AG-ACNP [facility]  Pgr: [phone]  PCCM Pgr: [phone]  Attending Note:  I have examined patient, reviewed labs, studies and notes. I have discussed the case with Beaulah Corin, and I agree with the data and plans as amended above. 65 yo man, s/p VT/VF arrest and CPR. Underwent hypothermia, rewarmed last night. On eval this am he opened eyes to voice, has tracked. He is not following commands but has moved his UE spontaneously, unclear if purposeful. Coarse B breath sounds. We will attempt  to minimize sedation, follow his neuro status. Explained to family that we will need to watch over the next 48h, assess for potential for SBT. Continue to follow off abx.  Independent critical care time is 35 minutes.   Baltazar Apo, MD, PhD 08/29/2016, 11:58 AM [facility] [phone] or if no answer [phone]

## 2016-08-30 ENCOUNTER — Inpatient Hospital Stay (HOSPITAL_COMMUNITY): Payer: Medicare Other

## 2016-08-30 DIAGNOSIS — I5041 Acute combined systolic (congestive) and diastolic (congestive) heart failure: Secondary | ICD-10-CM

## 2016-08-30 LAB — BASIC METABOLIC PANEL
Anion gap: 11 (ref 5–15)
BUN: 11 mg/dL (ref 6–20)
CO2: 26 mmol/L (ref 22–32)
Calcium: 9.4 mg/dL (ref 8.9–10.3)
Chloride: 106 mmol/L (ref 101–111)
Creatinine, Ser: 1.14 mg/dL (ref 0.61–1.24)
GFR calc Af Amer: >60 mL/min (ref >60)
GFR calc non Af Amer: >60 mL/min (ref >60)
Glucose, Bld: 106 mg/dL — ABNORMAL HIGH (ref 65–99)
Potassium: 3.5 mmol/L (ref 3.5–5.1)
Sodium: 143 mmol/L (ref 135–145)

## 2016-08-30 LAB — CBC
HCT: 35.6 % — ABNORMAL LOW (ref 39.0–52.0)
Hemoglobin: 11.2 g/dL — ABNORMAL LOW (ref 13.0–17.0)
MCH: 26.9 pg (ref 26.0–34.0)
MCHC: 31.5 g/dL (ref 30.0–36.0)
MCV: 85.6 fL (ref 78.0–100.0)
Platelets: 197 10*3/uL (ref 150–400)
RBC: 4.16 MIL/uL — ABNORMAL LOW (ref 4.22–5.81)
RDW: 13.9 % (ref 11.5–15.5)
WBC: 13.6 10*3/uL — ABNORMAL HIGH (ref 4.0–10.5)

## 2016-08-30 LAB — GLUCOSE, CAPILLARY
Glucose-Capillary: 101 mg/dL — ABNORMAL HIGH (ref 65–99)
Glucose-Capillary: 105 mg/dL — ABNORMAL HIGH (ref 65–99)
Glucose-Capillary: 109 mg/dL — ABNORMAL HIGH (ref 65–99)
Glucose-Capillary: 110 mg/dL — ABNORMAL HIGH (ref 65–99)
Glucose-Capillary: 121 mg/dL — ABNORMAL HIGH (ref 65–99)
Glucose-Capillary: 125 mg/dL — ABNORMAL HIGH (ref 65–99)

## 2016-08-30 LAB — MAGNESIUM: Magnesium: 1.8 mg/dL (ref 1.7–2.4)

## 2016-08-30 LAB — PHOSPHORUS: Phosphorus: 2.3 mg/dL — ABNORMAL LOW (ref 2.5–4.6)

## 2016-08-30 MED ORDER — FENTANYL CITRATE (PF) 100 MCG/2ML IJ SOLN
50.0000 ug | INTRAMUSCULAR | Status: DC | PRN
  Administered 2016-08-30: 50 ug via INTRAVENOUS

## 2016-08-30 MED ORDER — DOCUSATE SODIUM 50 MG/5ML PO LIQD
100.0000 mg | Freq: Every day | ORAL | Status: DC
  Administered 2016-08-30 – 2016-08-31 (×2): 100 mg
  Filled 2016-08-30 (×4): qty 10

## 2016-08-30 MED ORDER — CARVEDILOL 6.25 MG PO TABS
6.2500 mg | ORAL_TABLET | Freq: Two times a day (BID) | ORAL | Status: DC
  Administered 2016-08-30 – 2016-08-31 (×2): 6.25 mg via ORAL
  Filled 2016-08-30 (×2): qty 1

## 2016-08-30 MED ORDER — HYDRALAZINE HCL 20 MG/ML IJ SOLN
10.0000 mg | Freq: Four times a day (QID) | INTRAMUSCULAR | Status: DC | PRN
  Administered 2016-08-30 – 2016-09-08 (×7): 10 mg via INTRAVENOUS
  Filled 2016-08-30 (×9): qty 1

## 2016-08-30 MED ORDER — LISINOPRIL 2.5 MG PO TABS
2.5000 mg | ORAL_TABLET | Freq: Every day | ORAL | Status: DC
  Administered 2016-08-30 – 2016-08-31 (×2): 2.5 mg via ORAL
  Filled 2016-08-30 (×3): qty 1

## 2016-08-30 MED ORDER — FENTANYL 2500MCG IN NS 250ML (10MCG/ML) PREMIX INFUSION
25.0000 ug/h | INTRAVENOUS | Status: DC
Start: 2016-08-30 — End: 2016-08-31
  Administered 2016-08-31: 100 ug/h via INTRAVENOUS
  Filled 2016-08-30: qty 250

## 2016-08-30 NOTE — Progress Notes (Addendum)
Patient Name: Dean [name] Date of Encounter: 08/30/2016  Principal Problem:   Cardiopulmonary arrest [facility]) Active Problems:   DM2 (diabetes mellitus, type 2) ([place])   Morbid obesity ([place])   Essential hypertension, benign   Acute encephalopathy   Acute respiratory failure ([place])   Length of Stay: 3  SUBJECTIVE  Off sedation since yesterday, great progress, opens eyes, follows commands.   CURRENT MEDS . chlorhexidine gluconate (MEDLINE KIT)  15 mL Mouth Rinse BID  . famotidine (PEPCID) IV  20 mg Intravenous Q12H  . fentaNYL (SUBLIMAZE) injection  50 mcg Intravenous Once  . furosemide  40 mg Intravenous Q6H  . heparin  5,000 Units Subcutaneous Q8H  . insulin aspart  0-15 Units Subcutaneous Q4H  . mouth rinse  15 mL Mouth Rinse 10 times per day  . sodium chloride flush  10-40 mL Intracatheter Q12H   . sodium chloride    . fentaNYL infusion INTRAVENOUS 100 mcg/hr (08/30/16 0800)   OBJECTIVE  Vitals:   08/30/16 0634 08/30/16 0700 08/30/16 0731 08/30/16 0800  BP:  101/74  (!) 169/50  Pulse: 76 86 (!) 102 91  Resp: '18 18 15 18  ' Temp:  98.8 F (37.1 C)  99 F (37.2 C)  TempSrc:    Oral  SpO2: 100% 96% 99% 97%  Weight:      Height:        Intake/Output Summary (Last 24 hours) at 08/30/16 0944 Last data filed at 08/30/16 0800  Gross per 24 hour  Intake              383 ml  Output             3625 ml  Net            -3242 ml   Filed Weights   08/27/16 1251  Weight: (!) 310 lb (140.6 kg)    PHYSICAL EXAM  General: intubated, opens eyes Neck: Supple without bruits or JVD. Lungs:  Resp regular and unlabored, crackles B/L Heart: RRR no s3, s4, or murmurs. Abdomen: Soft, non-tender, non-distended, BS + x 4.  Extremities: No clubbing, cyanosis or edema. DP/PT/Radials 2+ and equal bilaterally.  Accessory Clinical Findings  CBC  Recent Labs  08/29/16 0350 08/30/16 0720  WBC 10.3 13.6*  HGB 11.1* 11.2*  HCT 34.4* 35.6*  MCV 84.5 85.6  PLT 115* 767    Basic Metabolic Panel  Recent Labs  08/29/16 0350 08/30/16 0720  NA 140 143  K 4.3 3.5  CL 109 106  CO2 23 26  GLUCOSE 102* 106*  BUN 12 11  CREATININE 1.17 1.14  CALCIUM 8.9 9.4  MG 1.6* 1.8  PHOS 3.1 2.3*    Recent Labs  08/27/16 1312  LIPASE 16   Cardiac Enzymes  Recent Labs  08/27/16 2011 08/28/16 0400 08/28/16 1011  TROPONINI 2.61* 2.73* 1.99*    Recent Labs  08/27/16 1705  HGBA1C 5.5   Radiology/Studies  Ct Head Wo Contrast  Result Date: 08/27/2016 CLINICAL DATA:  POST CPRwitnessed cardiac arrest.  IMPRESSION: 1. No acute intracranial abnormalities. 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 Electronically Signed   By: Lajean Manes M.D.   On: 08/27/2016 16:15   Ct Angio Chest Pe W And/or Wo Contrast  Addendum Date: 08/27/2016   ADDENDUM REPORT: 08/27/2016 19:56 ADDENDUM: Endotracheal tube terminates in the right mainstem bronchus, as dictated in the original report. Retracting approximately 3-4 cm should better position the tip above the carina. Critical Value/emergent  results were called by telephone at the time of interpretation on 08/27/2016 at 7:55 pm to [facility], who verbally acknowledged these results. Electronically Signed   By: Lorin Picket M.D.   On: 08/27/2016 19:56   Result Date: 08/27/2016 CLINICAL DATA:  Cardiac arrest. EXAM: CT ANGIOGRAPHY CHEST CT ABDOMEN AND PELVIS WITH CONTRAST  IMPRESSION: 1. Poor assessment of the lobar, segmental and subsegmental pulmonary arteries due to excessive respiratory motion. No definite central pulmonary embolus. 2. Dependent pulmonary parenchymal collapse/consolidation bilaterally. Aspiration and pneumonia are not excluded. 3. Hepatic lesions, previously characterized as hemangiomas on 03/03/2003. Images are not available for comparison. Some lesions measure larger on today's study. If further assessment is desired, MR abdomen without and with contrast is recommended, when the  patient is clinically stable and able to hold his breath, preferably as an outpatient. 4. Bladder wall appears thickened. 5.  Aortic atherosclerosis (ICD10-170.0). Electronically Signed: By: Lorin Picket M.D. On: 08/27/2016 16:37   Dg Chest Port 1 View  Result Date: 08/27/2016 CLINICAL DATA:  Endotracheal tube placement.  Initial encounter. EXAM: PORTABLE CHEST 1 VIEW COMPARISON:  CTA of the chest performed earlier today at 3:47 p.m. FINDINGS: The patient's endotracheal tube is seen ending 1-2 cm above the carina. This could be retracted 1-2 cm. An enteric tube is noted extending below the diaphragm. Bilateral airspace opacification is noted, with underlying vascular congestion. This may reflect pulmonary edema or pneumonia. No pleural effusion or pneumothorax is seen. The cardiomediastinal silhouette is enlarged. No acute osseous abnormalities are seen. An external pacing pad is noted. IMPRESSION: 1. Endotracheal tube seen ending 1-2 cm above the carina. This could be retracted 1-2 cm. 2. Bilateral airspace opacification, with underlying vascular congestion. This may reflect pulmonary edema or pneumonia. 3.  On: 08/27/2016 15:16   TELE: PVCs, couplets  ECG: SR, LBBB  TTE: 08/28/2016 - Left ventricle: The cavity size was mildly dilated. Wall   thickness was increased in a pattern of severe LVH. Systolic   function was moderately reduced. The estimated ejection fraction   was in the range of 35% to 40%. Akinesis of the basalinferior and   apical myocardium. Features are consistent with a pseudonormal   left ventricular filling pattern, with concomitant abnormal   relaxation and increased filling pressure (grade 2 diastolic   dysfunction). - Aortic valve: There was mild regurgitation. - Mitral valve: There was mild regurgitation. - Left atrium: The atrium was severely dilated.    ASSESSMENT AND PLAN  1. S/P cardiac arrest, minimal troponin elevation, most probably sec to shock applied by  EMS for VT/VF, ECG shows unchanged LBBB since 2014, TTE shows pattern of severe LVH. Systolic function was moderately reduced. The estimated ejection fraction was in the range of 35% to 40%. Akinesis of the basalinferior and apical myocardium. Rewarmed, off pressors, now hypertensive. CT head negative for stroke, EEG show no seizure activity. So far good neurologic recovery, we will follow. Ischemia workup once patient recovers.   2. Acute CHF - we will continue 40 mg iv q6h, crea 1.29 --> 1.17-> 1.14. Great diuresis overnight -3.4 L, with stable crea.  3. Hypertension - start carvedilol 6.26 mg po BID, lisinopril 2.5 mg po daily and PRN hydralazine 10 mg iv Q6 H  Signed, Ena Dawley MD, [facility] 08/30/2016

## 2016-08-30 NOTE — Progress Notes (Addendum)
PULMONARY / CRITICAL CARE MEDICINE   Name: Dean [name] MRN: [id number] DOB: 07-08-1951    ADMISSION DATE:  08/27/2016   REFERRING MD:  EDP  CHIEF COMPLAINT:   Post arrest   HISTORY OF PRESENT ILLNESS:   65 yo MO (310 lbs 5'5") AAM who was walking back from the bathroom 12/3 when he collapsed pulseless. CPR wa not performed by family and EMS arrived in 10 minutes and performed CPR x 21 minutes, 7 rounds of epi, 5 shocks, amiodarone and magnesium. He coded again with ROSC. King airway was changed by EDP. He was taken to CT scanner with NAD of brain, transfer to CCU and cardiology was called. Hypothermia protocol was initiated at 1545 after ct head. His prognosis for meaningful recover is poor.  SUBJECTIVE:  More awake and active this am On fentanyl 100  VITAL SIGNS: BP (!) 169/50 (BP Location: Left Arm)   Pulse 91   Temp 99 F (37.2 C) (Oral)   Resp 18   Ht 5\' 5"  (1.651 m)   Wt (!) 140.6 kg (310 lb)   SpO2 97%   BMI 51.59 kg/m   HEMODYNAMICS: CVP:  [3 mmHg-6 mmHg] 5 mmHg  VENTILATOR SETTINGS: Vent Mode: CPAP;PSV FiO2 (%):  [40 %] 40 % Set Rate:  [18 bmp] 18 bmp Vt Set:  [580 mL] 580 mL PEEP:  [5 cmH20] 5 cmH20 Pressure Support:  [10 cmH20] 10 cmH20 Plateau Pressure:  [20 cmH20-32 cmH20] 32 cmH20  INTAKE / OUTPUT: I/O last 3 completed shifts: In: 1700.8 [I.V.:1440.8; NG/GT:110; IV Piggyback:150] Out: 5650 [Urine:5150; Emesis/NG output:500]  PHYSICAL EXAMINATION: General:  Adult male, on vent Neuro:  Moves extremities with physical stimulation, opens eyes and tracks, followed commands for me 12/6 HEENT:  No neck, ETT in place Cardiovascular:  RRR, S1/S2, no MRG Lungs: Diffuse rhonchi throughout  Abdomen: Obese soft  Musculoskeletal: no deformities  Skin: dry, intact   LABS:  BMET  Recent Labs Lab 08/28/16 1146 08/29/16 0350 08/30/16 0720  NA 140 140 143  K 3.9 4.3 3.5  CL 109 109 106  CO2 23 23 26   BUN 19 12 11   CREATININE 1.29* 1.17 1.14   GLUCOSE 92 102* 106*    Electrolytes  Recent Labs Lab 08/28/16 1146 08/29/16 0350 08/30/16 0720  CALCIUM 8.9 8.9 9.4  MG  --  1.6* 1.8  PHOS  --  3.1 2.3*    CBC  Recent Labs Lab 08/27/16 1312  08/27/16 1839 08/29/16 0350 08/30/16 0720  WBC 22.1*  --   --  10.3 13.6*  HGB 12.7*  < > 12.9* 11.1* 11.2*  HCT 39.9  < > 38.0* 34.4* 35.6*  PLT 238  --   --  115* 197  < > = values in this interval not displayed.  Coag's  Recent Labs Lab 08/27/16 1705 08/28/16 0011 08/29/16 0350  APTT 26 34  --   INR 1.13 1.17 1.65    Sepsis Markers  Recent Labs Lab 08/27/16 1326 08/27/16 1937 08/28/16 0400 08/29/16 0350  LATICACIDVEN 5.88*  --   --  1.4  PROCALCITON  --  1.26 1.85 1.26    ABG  Recent Labs Lab 08/27/16 1303 08/27/16 1732 08/29/16 0427  PHART 7.229* 7.538* 7.349*  PCO2ART 55.4* 26.2* 39.7  PO2ART 24.0* 207.0* 95.0    Liver Enzymes No results for input(s): AST, ALT, ALKPHOS, BILITOT, ALBUMIN in the last 168 hours.  Cardiac Enzymes  Recent Labs Lab 08/27/16 2011 08/28/16 0400 08/28/16 1011  TROPONINI 2.61*  2.73* 1.99*    Glucose  Recent Labs Lab 08/29/16 1213 08/29/16 1600 08/29/16 2035 08/29/16 2342 08/30/16 0401 08/30/16 0824  GLUCAP 91 95 105* 97 101* 125*    Imaging Dg Chest Port 1 View  Result Date: 08/30/2016 CLINICAL DATA:  Ventilator dependent EXAM: PORTABLE CHEST 1 VIEW COMPARISON:  Yesterday FINDINGS: Endotracheal tube tip is 15 mm above the carina. An orogastric tube reaches the stomach. Left IJ central line with tip at the SVC level. Cardiopericardial enlargement. Interstitial opacities seen yesterday are improved. Lung volumes remain low. No pneumothorax. No definite effusion. IMPRESSION: 1. Endotracheal tube tip is 15 mm above the carina. Other support apparatus in stable position. 2. Improved pulmonary edema compared to yesterday. Electronically Signed   By: Monte Fantasia M.D.   On: 08/30/2016 07:07     STUDIES:   Echo > EF 35-40% grade 2 DD  CULTURES: 12/3 bc>> 12/3 sputum>> 12/3 urine>>  ANTIBIOTICS:  SIGNIFICANT EVENTS: Cardiac arrest 12/3 12/5 > Re-Warmed, off sedation   LINES/TUBES: 12/3 ET>> 12/3 left I J CVL>>  DISCUSSION: 65 yo MO (310 lbs 5'5") AAM who was walking back from the bathroom 12/3 when he collapsed pulseless. CPR wa not performed by family and EMS arrived in 10 minutes and performed CPR x 21 minutes, 7 rounds of epi, 5 shocks, amiodarone and magnesium. He coded again with ROSC. King airway was changed by EDP. He was taken to CT scanner with NAD of brain, transfer to CCU and cardiology was called. Hypothermia protocol was initiated at 1545 after ct head. His prognosis for meaningful recover is poor.  ASSESSMENT / PLAN:  PULMONARY A: Post cardiac arrest 12/3 with 31 minutes down Hx of Asthma P:   PRVC 8cc/kg Push PSV, goal extubation attempt either 12/6 or 7 Follow CXR  CARDIOVASCULAR A:  VT / VF arrest, ? Secondary to resp process Diastolic HF - grade 2 diastolic dysfunction ef [id number] P:  Cardiology following ACE-I and coreg as ordered Maintain MAP >65 Lasix scheduled q6h Defer L cath for now, reconsider as he stabilizes.   RENAL A:   ARI P:   Avoid nephrotoxins Monitor BMP and UOP with current support Lasix as above  GASTROINTESTINAL A:   GI protection P:   PPI  HEMATOLOGIC A:   DVT prophylaxis P:  Heparin sq Trend CBC   INFECTIOUS A:   No overt infection P:   Follow cx data off abx Trend fever and WBC curve   ENDOCRINE A:   DM P:   SSI  NEUROLOGIC A:   Post Cardiac arrest CT head appears negative Sedation management for MV P:   RASS goal:-1 to 0 D/C versed  Change fentanyl gtt to PRN fentanyl  Neurology following    FAMILY  - Updates:  Updated per Dr. Nelda Marseille 12/3, by Dr Lamonte Sakai 12/4 and 5. Plan for family meeting Friday, pending neuro status.   - Inter-disciplinary family meet or Palliative Care meeting due by:  09/03/16  Independent critical care time is 35 minutes.   Baltazar Apo, MD, PhD 08/30/2016, 9:39 AM [facility] [phone] or if no answer [phone]

## 2016-08-30 NOTE — Final Consult Note (Signed)
Neurology sign off note  Subjective: Much improved.   Exam:  Vitals:   08/30/16 0731 08/30/16 0800  BP:  (!) 169/50  Pulse: (!) 102 91  Resp: 15 18  Temp:  99 F (37.2 C)   Gen: In bed, NAD Resp: non-labored breathing, no acute distress Abd: soft, nt  Neuro: MS: awake, following commands [id number], eyes conjugate(must have underlying exophoria) Motor: MAEW, f/c x 4 Sensory:intact to LT  Follow up recommendations: No need for neuro follow up unless other issues are identified.   Brief summary of case/Impression: 65 yo M with concern for seizures after arrest due to eye movments. After consultation, no signs identified to support seizures and with improvement, feel that continued supportive care is indicated from a neurological standpoint.   Neurological Diagnoses: 1) Anoxic encephalopathy  Recommendations: 1) Continued aggressive care.  2) Neurology to sign off at this time. Please call with any further questions or concerns.  Roland Rack, MD [facility] [phone]  If 7pm- 7am, please page neurology on call as listed in [facility].

## 2016-08-30 NOTE — Progress Notes (Signed)
eLink Physician-Brief Progress Note Patient Name: Dean [name] DOB: 07/17/1951 MRN: [id number]   Date of Service  08/30/2016  HPI/Events of Note  Agitated and constipated  eICU Interventions  Fentanyl drip and colace ordered.     Intervention Category Major Interventions: Other:  [name] 08/30/2016, 3:34 PM

## 2016-08-31 ENCOUNTER — Inpatient Hospital Stay (HOSPITAL_COMMUNITY): Payer: Medicare Other

## 2016-08-31 DIAGNOSIS — I469 Cardiac arrest, cause unspecified: Secondary | ICD-10-CM

## 2016-08-31 LAB — GLUCOSE, CAPILLARY
Glucose-Capillary: 104 mg/dL — ABNORMAL HIGH (ref 65–99)
Glucose-Capillary: 104 mg/dL — ABNORMAL HIGH (ref 65–99)
Glucose-Capillary: 105 mg/dL — ABNORMAL HIGH (ref 65–99)
Glucose-Capillary: 105 mg/dL — ABNORMAL HIGH (ref 65–99)
Glucose-Capillary: 107 mg/dL — ABNORMAL HIGH (ref 65–99)
Glucose-Capillary: 120 mg/dL — ABNORMAL HIGH (ref 65–99)

## 2016-08-31 LAB — BASIC METABOLIC PANEL
Anion gap: 9 (ref 5–15)
BUN: 16 mg/dL (ref 6–20)
CO2: 28 mmol/L (ref 22–32)
Calcium: 9.5 mg/dL (ref 8.9–10.3)
Chloride: 107 mmol/L (ref 101–111)
Creatinine, Ser: 1.27 mg/dL — ABNORMAL HIGH (ref 0.61–1.24)
GFR calc Af Amer: >60 mL/min (ref >60)
GFR calc non Af Amer: 57 mL/min — ABNORMAL LOW (ref >60)
Glucose, Bld: 110 mg/dL — ABNORMAL HIGH (ref 65–99)
Potassium: 3.3 mmol/L — ABNORMAL LOW (ref 3.5–5.1)
Sodium: 144 mmol/L (ref 135–145)

## 2016-08-31 LAB — CBC
HCT: 33.3 % — ABNORMAL LOW (ref 39.0–52.0)
Hemoglobin: 10.5 g/dL — ABNORMAL LOW (ref 13.0–17.0)
MCH: 26.7 pg (ref 26.0–34.0)
MCHC: 31.5 g/dL (ref 30.0–36.0)
MCV: 84.7 fL (ref 78.0–100.0)
Platelets: 226 10*3/uL (ref 150–400)
RBC: 3.93 MIL/uL — ABNORMAL LOW (ref 4.22–5.81)
RDW: 13.6 % (ref 11.5–15.5)
WBC: 13 10*3/uL — ABNORMAL HIGH (ref 4.0–10.5)

## 2016-08-31 LAB — PHOSPHORUS: Phosphorus: 2.7 mg/dL (ref 2.5–4.6)

## 2016-08-31 LAB — MAGNESIUM: Magnesium: 1.8 mg/dL (ref 1.7–2.4)

## 2016-08-31 MED ORDER — POTASSIUM CHLORIDE 20 MEQ/15ML (10%) PO SOLN
ORAL | Status: AC
Start: 2016-08-31 — End: 2016-08-31
  Filled 2016-08-31: qty 30

## 2016-08-31 MED ORDER — CHLORHEXIDINE GLUCONATE 0.12 % MT SOLN
15.0000 mL | Freq: Two times a day (BID) | OROMUCOSAL | Status: DC
  Administered 2016-08-31 – 2016-09-15 (×27): 15 mL via OROMUCOSAL
  Filled 2016-08-31 (×24): qty 15

## 2016-08-31 MED ORDER — ORAL CARE MOUTH RINSE
15.0000 mL | Freq: Two times a day (BID) | OROMUCOSAL | Status: DC
  Administered 2016-09-01 – 2016-09-13 (×21): 15 mL via OROMUCOSAL

## 2016-08-31 MED ORDER — POTASSIUM CHLORIDE 2 MEQ/ML IV SOLN
30.0000 meq | Freq: Once | INTRAVENOUS | Status: AC
  Administered 2016-08-31: 30 meq via INTRAVENOUS
  Filled 2016-08-31: qty 15

## 2016-08-31 MED ORDER — POTASSIUM CHLORIDE 20 MEQ/15ML (10%) PO SOLN
40.0000 meq | Freq: Once | ORAL | Status: AC
  Administered 2016-08-31: 40 meq via ORAL

## 2016-08-31 MED ORDER — CARVEDILOL 12.5 MG PO TABS
12.5000 mg | ORAL_TABLET | Freq: Two times a day (BID) | ORAL | Status: DC
  Filled 2016-08-31: qty 1

## 2016-08-31 MED ORDER — FUROSEMIDE 10 MG/ML IJ SOLN
20.0000 mg | Freq: Two times a day (BID) | INTRAMUSCULAR | Status: DC
  Administered 2016-08-31 – 2016-09-07 (×14): 20 mg via INTRAVENOUS
  Filled 2016-08-31 (×14): qty 2

## 2016-08-31 MED ORDER — POTASSIUM CHLORIDE CRYS ER 20 MEQ PO TBCR: 40.0000 meq | EXTENDED_RELEASE_TABLET | Freq: Once | ORAL | Status: DC

## 2016-08-31 NOTE — Progress Notes (Signed)
PULMONARY / CRITICAL CARE MEDICINE   Name: Dean [name] MRN: [id number] DOB: 1951-04-03    ADMISSION DATE:  08/27/2016   REFERRING MD:  EDP  CHIEF COMPLAINT:   Post arrest   HISTORY OF PRESENT ILLNESS:   65 yo MO (310 lbs 5'5") AAM who was walking back from the bathroom 12/3 when he collapsed pulseless. CPR wa not performed by family and EMS arrived in 10 minutes and performed CPR x 21 minutes, 7 rounds of epi, 5 shocks, amiodarone and magnesium. He coded again with ROSC. King airway was changed by EDP. He was taken to CT scanner with NAD of brain, transfer to CCU and cardiology was called. Hypothermia protocol was initiated at 1545 after ct head. His prognosis for meaningful recover is poor.  SUBJECTIVE:  Tolerating PSV, awake  VITAL SIGNS: BP (!) 134/52   Pulse (!) 101   Temp 99.4 F (37.4 C) (Oral)   Resp (!) 24   Ht 5\' 5"  (1.651 m)   Wt 120.1 kg (264 lb 12.4 oz)   SpO2 96%   BMI 44.06 kg/m   HEMODYNAMICS: CVP:  [4 mmHg-8 mmHg] 4 mmHg  VENTILATOR SETTINGS: Vent Mode: CPAP;PSV FiO2 (%):  [30 %-40 %] 40 % Set Rate:  [18 bmp] 18 bmp Vt Set:  [580 mL] 580 mL PEEP:  [5 cmH20] 5 cmH20 Pressure Support:  [5 cmH20-10 cmH20] 5 cmH20 Plateau Pressure:  [21 cmH20-22 cmH20] 21 cmH20  INTAKE / OUTPUT: I/O last 3 completed shifts: In: 925.3 [I.V.:500.3; NG/GT:60; IV Piggyback:365] Out: 2630 [Urine:2130; Emesis/NG output:500]  PHYSICAL EXAMINATION: General:  Adult male, on vent Neuro:  Moves extremities with physical stimulation, opens eyes and tracks, following commands HEENT:  No neck, ETT in place Cardiovascular:  RRR, S1/S2, no MRG Lungs: Diffuse rhonchi throughout  Abdomen: Obese soft  Musculoskeletal: no deformities  Skin: dry, intact   LABS:  BMET  Recent Labs Lab 08/29/16 0350 08/30/16 0720 08/31/16 0427  NA 140 143 144  K 4.3 3.5 3.3*  CL 109 106 107  CO2 23 26 28   BUN 12 11 16   CREATININE 1.17 1.14 1.27*  GLUCOSE 102* 106* 110*     Electrolytes  Recent Labs Lab 08/29/16 0350 08/30/16 0720 08/31/16 0427  CALCIUM 8.9 9.4 9.5  MG 1.6* 1.8 1.8  PHOS 3.1 2.3* 2.7    CBC  Recent Labs Lab 08/29/16 0350 08/30/16 0720 08/31/16 0427  WBC 10.3 13.6* 13.0*  HGB 11.1* 11.2* 10.5*  HCT 34.4* 35.6* 33.3*  PLT 115* 197 226    Coag's  Recent Labs Lab 08/27/16 1705 08/28/16 0011 08/29/16 0350  APTT 26 34  --   INR 1.13 1.17 1.65    Sepsis Markers  Recent Labs Lab 08/27/16 1326 08/27/16 1937 08/28/16 0400 08/29/16 0350  LATICACIDVEN 5.88*  --   --  1.4  PROCALCITON  --  1.26 1.85 1.26    ABG  Recent Labs Lab 08/27/16 1303 08/27/16 1732 08/29/16 0427  PHART 7.229* 7.538* 7.349*  PCO2ART 55.4* 26.2* 39.7  PO2ART 24.0* 207.0* 95.0    Liver Enzymes No results for input(s): AST, ALT, ALKPHOS, BILITOT, ALBUMIN in the last 168 hours.  Cardiac Enzymes  Recent Labs Lab 08/27/16 2011 08/28/16 0400 08/28/16 1011  TROPONINI 2.61* 2.73* 1.99*    Glucose  Recent Labs Lab 08/30/16 1152 08/30/16 1604 08/30/16 2024 08/30/16 2348 08/31/16 0418 08/31/16 0753  GLUCAP 109* 121* 110* 105* 104* 105*    Imaging Dg Chest Port 1 View  Result Date: 08/31/2016  CLINICAL DATA:  Acute respiratory failure EXAM: PORTABLE CHEST 1 VIEW COMPARISON:  08/30/2016 FINDINGS: Support devices are stable. Cardiomegaly with vascular congestion. Increasing bibasilar opacities, likely bibasilar atelectasis. Small left effusion. IMPRESSION: Increasing bibasilar atelectasis and small left effusion. Electronically Signed   By: Rolm Baptise M.D.   On: 08/31/2016 07:57     STUDIES:  Echo > EF 35-40% grade 2 DD  CULTURES: 12/3 bc>> 12/3 sputum>> 12/3 urine>> not done  ANTIBIOTICS:  SIGNIFICANT EVENTS: Cardiac arrest 12/3 12/5 > Re-Warmed, off sedation   LINES/TUBES: 12/3 ET>> 12/3 left I J CVL>>  DISCUSSION: 65 yo MO (310 lbs 5'5") AAM who was walking back from the bathroom 12/3 when he collapsed  pulseless. CPR wa not performed by family and EMS arrived in 10 minutes and performed CPR x 21 minutes, 7 rounds of epi, 5 shocks, amiodarone and magnesium. He coded again with ROSC. King airway was changed by EDP. He was taken to CT scanner with NAD of brain, transfer to CCU and cardiology was called. Hypothermia protocol was initiated at 1545 after ct head.  ASSESSMENT / PLAN:  PULMONARY A: Post cardiac arrest 12/3 with 31 minutes down Hx of Asthma P:   Goal extubate 12/7 Follow CXR  CARDIOVASCULAR A:  VT / VF arrest, ? Secondary to resp process Diastolic HF - grade 2 diastolic dysfunction ef [id number] P:  Cardiology following ACE-I and coreg as ordered Maintain MAP >65 Lasix scheduled q12h Defer L cath for now, reconsider post extubation  RENAL A:   ARI P:   Avoid nephrotoxins Monitor BMP and UOP with current support Net negative, decreased freq of his lasix with rising S Cr  GASTROINTESTINAL A:   GI protection P:   PPI  HEMATOLOGIC A:   DVT prophylaxis P:  Heparin sq Trend CBC   INFECTIOUS A:   No overt infection P:   Follow cx data off abx Trend fever and WBC curve   ENDOCRINE A:   DM P:   SSI  NEUROLOGIC A:   Post Cardiac arrest CT head appears negative Sedation management for MV P:   RASS goal:-1 to 0 D/C versed  Stop fentanyl Neurology following    FAMILY  - Updates:  Updated per Dr. Nelda Marseille 12/3, by Dr Lamonte Sakai 12/4 - 12/7.   - Inter-disciplinary family meet or Palliative Care meeting due by: 09/03/16  Independent critical care time is 35 minutes.   Baltazar Apo, MD, PhD 08/31/2016, 10:26 AM [facility] [phone] or if no answer [phone]

## 2016-08-31 NOTE — Progress Notes (Signed)
233ml's of fentanyl wasted with Newman Nickels as second rn to verify.

## 2016-08-31 NOTE — Progress Notes (Signed)
Patient Name: Dean [name] Date of Encounter: 08/31/2016  Principal Problem:   Cardiopulmonary arrest [facility]) Active Problems:   DM2 (diabetes mellitus, type 2) ([place])   Morbid obesity ([place])   Essential hypertension, benign   Acute encephalopathy   Acute respiratory failure ([place])   Length of Stay: 4  SUBJECTIVE  Back on minimal sedation sec to agitation, but follows commands, nodes head to answer.   CURRENT MEDS . carvedilol  6.25 mg Oral BID WC  . chlorhexidine gluconate (MEDLINE KIT)  15 mL Mouth Rinse BID  . docusate  100 mg Per Tube Daily  . famotidine (PEPCID) IV  20 mg Intravenous Q12H  . furosemide  40 mg Intravenous Q6H  . heparin  5,000 Units Subcutaneous Q8H  . insulin aspart  0-15 Units Subcutaneous Q4H  . lisinopril  2.5 mg Oral Daily  . mouth rinse  15 mL Mouth Rinse 10 times per day  . sodium chloride flush  10-40 mL Intracatheter Q12H   . sodium chloride 10 mL/hr at 08/30/16 0700  . fentaNYL infusion INTRAVENOUS 50 mcg/hr (08/31/16 0800)   OBJECTIVE  Vitals:   08/31/16 0700 08/31/16 0800 08/31/16 0804 08/31/16 0900  BP: (!) 156/59 (!) 144/52 (!) 144/52 (!) 134/52  Pulse: 76 86 94 (!) 101  Resp: '12 16 14 ' (!) 24  Temp:  99.4 F (37.4 C)    TempSrc:  Oral    SpO2: 97% 97% 97% 96%  Weight:      Height:        Intake/Output Summary (Last 24 hours) at 08/31/16 1025 Last data filed at 08/31/16 0904  Gross per 24 hour  Intake           766.05 ml  Output             1355 ml  Net          -588.95 ml   Filed Weights   08/27/16 1251 08/31/16 0230  Weight: (!) 310 lb (140.6 kg) 264 lb 12.4 oz (120.1 kg)    PHYSICAL EXAM  General: intubated, opens eyes Neck: Supple without bruits or JVD. Lungs:  Resp regular and unlabored, crackles B/L Heart: RRR no s3, s4, or murmurs. Abdomen: Soft, non-tender, non-distended, BS + x 4.  Extremities: No clubbing, cyanosis or edema. DP/PT/Radials 2+ and equal bilaterally.  Accessory Clinical  Findings  CBC  Recent Labs  08/30/16 0720 08/31/16 0427  WBC 13.6* 13.0*  HGB 11.2* 10.5*  HCT 35.6* 33.3*  MCV 85.6 84.7  PLT 197 161   Basic Metabolic Panel  Recent Labs  08/30/16 0720 08/31/16 0427  NA 143 144  K 3.5 3.3*  CL 106 107  CO2 26 28  GLUCOSE 106* 110*  BUN 11 16  CREATININE 1.14 1.27*  CALCIUM 9.4 9.5  MG 1.8 1.8  PHOS 2.3* 2.7   No results for input(s): LIPASE, AMYLASE in the last 72 hours. Cardiac Enzymes No results for input(s): CKTOTAL, CKMB, CKMBINDEX, TROPONINI in the last 72 hours. No results for input(s): HGBA1C in the last 72 hours. Radiology/Studies  Ct Head Wo Contrast  Result Date: 08/27/2016 CLINICAL DATA:  POST CPRwitnessed cardiac arrest.  IMPRESSION: 1. No acute intracranial abnormalities. 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 Electronically Signed   By: Lajean Manes M.D.   On: 08/27/2016 16:15   Ct Angio Chest Pe W And/or Wo Contrast  Addendum Date: 08/27/2016   ADDENDUM REPORT: 08/27/2016 19:56 ADDENDUM: Endotracheal tube terminates in the right  mainstem bronchus, as dictated in the original report. Retracting approximately 3-4 cm should better position the tip above the carina. Critical Value/emergent results were called by telephone at the time of interpretation on 08/27/2016 at 7:55 pm to [facility], who verbally acknowledged these results. Electronically Signed   By: Lorin Picket M.D.   On: 08/27/2016 19:56   Result Date: 08/27/2016 CLINICAL DATA:  Cardiac arrest. EXAM: CT ANGIOGRAPHY CHEST CT ABDOMEN AND PELVIS WITH CONTRAST  IMPRESSION: 1. Poor assessment of the lobar, segmental and subsegmental pulmonary arteries due to excessive respiratory motion. No definite central pulmonary embolus. 2. Dependent pulmonary parenchymal collapse/consolidation bilaterally. Aspiration and pneumonia are not excluded. 3. Hepatic lesions, previously characterized as hemangiomas on 03/03/2003. Images are not available  for comparison. Some lesions measure larger on today's study. If further assessment is desired, MR abdomen without and with contrast is recommended, when the patient is clinically stable and able to hold his breath, preferably as an outpatient. 4. Bladder wall appears thickened. 5.  Aortic atherosclerosis (ICD10-170.0). Electronically Signed: By: Lorin Picket M.D. On: 08/27/2016 16:37   Dg Chest Port 1 View  Result Date: 08/27/2016 CLINICAL DATA:  Endotracheal tube placement.  Initial encounter. EXAM: PORTABLE CHEST 1 VIEW COMPARISON:  CTA of the chest performed earlier today at 3:47 p.m. FINDINGS: The patient's endotracheal tube is seen ending 1-2 cm above the carina. This could be retracted 1-2 cm. An enteric tube is noted extending below the diaphragm. Bilateral airspace opacification is noted, with underlying vascular congestion. This may reflect pulmonary edema or pneumonia. No pleural effusion or pneumothorax is seen. The cardiomediastinal silhouette is enlarged. No acute osseous abnormalities are seen. An external pacing pad is noted. IMPRESSION: 1. Endotracheal tube seen ending 1-2 cm above the carina. This could be retracted 1-2 cm. 2. Bilateral airspace opacification, with underlying vascular congestion. This may reflect pulmonary edema or pneumonia. 3.  On: 08/27/2016 15:16   TELE: PVCs, couplets  ECG: SR, LBBB  TTE: 08/28/2016 - Left ventricle: The cavity size was mildly dilated. Wall   thickness was increased in a pattern of severe LVH. Systolic   function was moderately reduced. The estimated ejection fraction   was in the range of 35% to 40%. Akinesis of the basalinferior and   apical myocardium. Features are consistent with a pseudonormal   left ventricular filling pattern, with concomitant abnormal   relaxation and increased filling pressure (grade 2 diastolic   dysfunction). - Aortic valve: There was mild regurgitation. - Mitral valve: There was mild regurgitation. - Left  atrium: The atrium was severely dilated.    ASSESSMENT AND PLAN  1. S/P cardiac arrest, minimal troponin elevation, most probably sec to shock applied by EMS for VT/VF, ECG shows unchanged LBBB since 2014, TTE shows pattern of severe LVH. Systolic function was moderately reduced. The estimated ejection fraction was in the range of 35% to 40%. Akinesis of the basalinferior and apical myocardium. Rewarmed, off pressors, now hypertensive. CT head negative for stroke, EEG show no seizure activity. So far good neurologic recovery, we will follow. Ischemia workup once patient recovers. So far great recovery.  2. Acute CHF - now CVP 4, decreased urine output, hypokalemia and Crea 1.1 _> 1.27. I will chanbge lasix to 20 mg iv Q12H, replace potassium.  3. Hypertension - started carvedilol 6.26 mg po BID, lisinopril 2.5 mg po daily and PRN hydralazine 10 mg iv Q6 H, I will increase carvedilol to 12.5 mg po BID.   Signed, Ena Dawley MD,  [facility] 08/31/2016

## 2016-08-31 NOTE — Care Management Note (Signed)
Case Management Note  Patient Details  Name: Dean [name] MRN: [id number] Date of Birth: April 12, 1951  Subjective/Objective:   Pt admitted post cardiac arrest                 Action/Plan:  PTA from home with wife   Expected Discharge Date:                  Expected Discharge Plan:     In-House Referral:     Discharge planning Services  CM Consult  Post Acute Care Choice:    Choice offered to:     DME Arranged:    DME Agency:     HH Arranged:    [place] Agency:     Status of Service:  In process, will continue to follow  If discussed at Long Length of Stay Meetings, dates discussed:    Additional Comments: 08/31/2016 Pt extubated today Maryclare Labrador, RN 08/31/2016, 3:59 PM

## 2016-08-31 NOTE — Progress Notes (Signed)
[facility] Progress Note Patient Name: Dean [name] DOB: 08-Feb-1951 MRN: [id number]   Date of Service  08/31/2016  HPI/Events of Note  K+ = 3.3 and Creatinine = 1.27.  eICU Interventions  Will replace K+.      Intervention Category Intermediate Interventions: Electrolyte abnormality - evaluation and management  [name] 08/31/2016, 5:12 AM

## 2016-08-31 NOTE — Procedures (Signed)
Extubation Procedure Note  Patient Details:   Name: Dean [name] DOB: August 19, 1951 MRN: [id number]   Airway Documentation:     Evaluation  O2 sats: stable throughout Complications: No apparent complications Patient did tolerate procedure well. Bilateral Breath Sounds: Diminished, Rhonchi   Yes   PT extubated per MD order and placed on 4L .  Sats 100%.  Pt tolerated well.  RT will continue to monitor.  RN at bedside.  RT will continue to monitor.  Pierre Bali 08/31/2016, 11:25 AM

## 2016-09-01 DIAGNOSIS — R410 Disorientation, unspecified: Secondary | ICD-10-CM

## 2016-09-01 LAB — BASIC METABOLIC PANEL
Anion gap: 9 (ref 5–15)
BUN: 17 mg/dL (ref 6–20)
CO2: 30 mmol/L (ref 22–32)
Calcium: 9.9 mg/dL (ref 8.9–10.3)
Chloride: 110 mmol/L (ref 101–111)
Creatinine, Ser: 1.1 mg/dL (ref 0.61–1.24)
GFR calc Af Amer: >60 mL/min (ref >60)
GFR calc non Af Amer: >60 mL/min (ref >60)
Glucose, Bld: 105 mg/dL — ABNORMAL HIGH (ref 65–99)
Potassium: 3.6 mmol/L (ref 3.5–5.1)
Sodium: 149 mmol/L — ABNORMAL HIGH (ref 135–145)

## 2016-09-01 LAB — HEPATIC FUNCTION PANEL
ALT: 62 U/L (ref 17–63)
AST: 68 U/L — ABNORMAL HIGH (ref 15–41)
Albumin: 3.3 g/dL — ABNORMAL LOW (ref 3.5–5.0)
Alkaline Phosphatase: 83 U/L (ref 38–126)
Bilirubin, Direct: 1.9 mg/dL — ABNORMAL HIGH (ref 0.1–0.5)
Indirect Bilirubin: 1.8 mg/dL — ABNORMAL HIGH (ref 0.3–0.9)
Total Bilirubin: 3.7 mg/dL — ABNORMAL HIGH (ref 0.3–1.2)
Total Protein: 7.5 g/dL (ref 6.5–8.1)

## 2016-09-01 LAB — CBC
HCT: 34 % — ABNORMAL LOW (ref 39.0–52.0)
Hemoglobin: 10.6 g/dL — ABNORMAL LOW (ref 13.0–17.0)
MCH: 26.8 pg (ref 26.0–34.0)
MCHC: 31.2 g/dL (ref 30.0–36.0)
MCV: 85.9 fL (ref 78.0–100.0)
Platelets: 209 10*3/uL (ref 150–400)
RBC: 3.96 MIL/uL — ABNORMAL LOW (ref 4.22–5.81)
RDW: 13.8 % (ref 11.5–15.5)
WBC: 9.5 10*3/uL (ref 4.0–10.5)

## 2016-09-01 LAB — CULTURE, BLOOD (ROUTINE X 2)
Culture: NO GROWTH
Culture: NO GROWTH

## 2016-09-01 LAB — AMMONIA: Ammonia: 26 umol/L (ref 9–35)

## 2016-09-01 LAB — MAGNESIUM: Magnesium: 2.2 mg/dL (ref 1.7–2.4)

## 2016-09-01 LAB — GLUCOSE, CAPILLARY
Glucose-Capillary: 89 mg/dL (ref 65–99)
Glucose-Capillary: 91 mg/dL (ref 65–99)
Glucose-Capillary: 94 mg/dL (ref 65–99)
Glucose-Capillary: 124 mg/dL — ABNORMAL HIGH (ref 65–99)
Glucose-Capillary: 98 mg/dL (ref 65–99)

## 2016-09-01 LAB — PHOSPHORUS: Phosphorus: 3.8 mg/dL (ref 2.5–4.6)

## 2016-09-01 MED ORDER — ISOSORBIDE MONONITRATE ER 30 MG PO TB24: 30.0000 mg | ORAL_TABLET | Freq: Every day | ORAL | Status: DC

## 2016-09-01 MED ORDER — CARVEDILOL 25 MG PO TABS: 25.0000 mg | ORAL_TABLET | Freq: Two times a day (BID) | ORAL | Status: DC

## 2016-09-01 MED ORDER — ORAL CARE MOUTH RINSE
15.0000 mL | Freq: Two times a day (BID) | OROMUCOSAL | Status: DC
  Administered 2016-09-02 – 2016-09-04 (×5): 15 mL via OROMUCOSAL

## 2016-09-01 MED ORDER — METOPROLOL TARTRATE 5 MG/5ML IV SOLN
5.0000 mg | Freq: Four times a day (QID) | INTRAVENOUS | Status: DC
  Administered 2016-09-01 – 2016-09-06 (×14): 5 mg via INTRAVENOUS
  Filled 2016-09-01 (×15): qty 5

## 2016-09-01 MED ORDER — LORAZEPAM 2 MG/ML IJ SOLN
INTRAMUSCULAR | Status: AC
  Administered 2016-09-01: 1 mg
  Filled 2016-09-01: qty 1

## 2016-09-01 MED ORDER — DEXTROSE 5 % IV SOLN
INTRAVENOUS | Status: DC
  Administered 2016-09-01: 50 mL via INTRAVENOUS
  Administered 2016-09-02 (×2): via INTRAVENOUS
  Administered 2016-09-03: 50 mL via INTRAVENOUS
  Administered 2016-09-05: 03:00:00 via INTRAVENOUS

## 2016-09-01 MED ORDER — HYDRALAZINE HCL 25 MG PO TABS: 25.0000 mg | ORAL_TABLET | Freq: Three times a day (TID) | ORAL | Status: DC

## 2016-09-01 MED ORDER — HALOPERIDOL LACTATE 5 MG/ML IJ SOLN
5.0000 mg | Freq: Four times a day (QID) | INTRAMUSCULAR | Status: DC | PRN
  Administered 2016-09-01 – 2016-09-02 (×2): 5 mg via INTRAVENOUS
  Filled 2016-09-01 (×2): qty 1

## 2016-09-01 MED ORDER — LORAZEPAM 2 MG/ML IJ SOLN: 1.0000 mg | Freq: Once | INTRAMUSCULAR | Status: AC

## 2016-09-01 NOTE — Progress Notes (Signed)
0231, pt had 28 beats of vtach

## 2016-09-01 NOTE — Progress Notes (Signed)
Nutrition Follow-up  DOCUMENTATION CODES:   Morbid obesity  INTERVENTION:   If pt unable to pass swallow eval recommend place Cortrak tube and initiate enteral nutrition therapy. Jevity 1.2 @ 65 ml/hr (1560 ml/day) 30 ml Prostat daily Provides: 1972 kcal, 101 grams protein, and 1263 ml H2O.    NUTRITION DIAGNOSIS:   Inadequate oral intake related to inability to eat as evidenced by NPO status. Ongoing.   GOAL:   Provide needs based on ASPEN/SCCM guidelines Not met.   MONITOR:   Vent status, Labs, Weight trends, Skin, I & O's  ASSESSMENT:   65 yo MO (310 lbs 5'5") AAM who was walking back from the bathroom 12/3 when he collapsed pulseless. CPR wa not performed by family and EMS arrived in 10 minutes and performed CPR x 21 minutes, 7 rounds of epi, 5 shocks, amiodarone and magnesium. He coded again with ROSC. King airway was changed by EDP. He was taken to CT scanner with NAD of brain, transfer to CCU and cardiology was called. Hypothermia protocol was initiated at 1545 after ct head. His prognosis for meaningful recover is poor.  12/7 extubated No enteral access  Labs reviewed: Na 149   Diet Order:  Diet NPO time specified  Skin:  Reviewed, no issues  Last BM:  unknown  Height:   Ht Readings from Last 1 Encounters:  08/27/16 '5\' 5"'  (1.651 m)    Weight:   Wt Readings from Last 1 Encounters:  08/31/16 264 lb 12.4 oz (120.1 kg)    Ideal Body Weight:  61.8 kg  BMI:  Body mass index is 44.06 kg/m.  Estimated Nutritional Needs:   Kcal:  1900-2100  Protein:  100-120 grams  Fluid:  > 1.9 L/day  EDUCATION NEEDS:   No education needs identified at this time  [place], [place], [place] Pager [phone] After Hours Pager

## 2016-09-01 NOTE — Progress Notes (Signed)
Patient Name: Dean [name] Date of Encounter: 09/01/2016  Principal Problem:   Cardiopulmonary arrest [facility]) Active Problems:   DM2 (diabetes mellitus, type 2) ([place])   Morbid obesity ([place])   Essential hypertension, benign   Acute encephalopathy   Acute respiratory failure ([place])   Length of Stay: 5  SUBJECTIVE  The patient is extubated, he was agitated overnight.   CURRENT MEDS . carvedilol  12.5 mg Oral BID WC  . chlorhexidine  15 mL Mouth Rinse BID  . docusate  100 mg Per Tube Daily  . famotidine (PEPCID) IV  20 mg Intravenous Q12H  . furosemide  20 mg Intravenous Q12H  . heparin  5,000 Units Subcutaneous Q8H  . insulin aspart  0-15 Units Subcutaneous Q4H  . lisinopril  2.5 mg Oral Daily  . mouth rinse  15 mL Mouth Rinse q12n4p  . sodium chloride flush  10-40 mL Intracatheter Q12H   . sodium chloride Stopped (08/31/16 2300)   OBJECTIVE  Vitals:   09/01/16 0600 09/01/16 0700 09/01/16 0800 09/01/16 0855  BP: (!) 173/65 (!) 149/77 (S) (!) 175/67   Pulse: 86 91 96   Resp: (!) 27 (!) 22 (!) 22   Temp:    97.9 F (36.6 C)  TempSrc:    Oral  SpO2: 94% 94% 95%   Weight:      Height:        Intake/Output Summary (Last 24 hours) at 09/01/16 0929 Last data filed at 09/01/16 0800  Gross per 24 hour  Intake              205 ml  Output             2570 ml  Net            -2365 ml   Filed Weights   08/27/16 1251 08/31/16 0230  Weight: (!) 310 lb (140.6 kg) 264 lb 12.4 oz (120.1 kg)    PHYSICAL EXAM  General: sleeping, extubated Neck: Supple without bruits or JVD. Lungs:  Resp regular and unlabored, crackles B/L Heart: RRR no s3, s4, or murmurs. Abdomen: Soft, non-tender, non-distended, BS + x 4.  Extremities: No clubbing, cyanosis or edema. DP/PT/Radials 2+ and equal bilaterally.  Accessory Clinical Findings  CBC  Recent Labs  08/31/16 0427 09/01/16 0248  WBC 13.0* 9.5  HGB 10.5* 10.6*  HCT 33.3* 34.0*  MCV 84.7 85.9  PLT 226 [id number]   Basic  Metabolic Panel  Recent Labs  08/31/16 0427 09/01/16 0248  NA 144 149*  K 3.3* 3.6  CL 107 110  CO2 28 30  GLUCOSE 110* 105*  BUN 16 17  CREATININE 1.27* 1.10  CALCIUM 9.5 9.9  MG 1.8 2.2  PHOS 2.7 3.8   Ct Head Wo Contrast  Result Date: 08/27/2016 CLINICAL DATA:  POST CPRwitnessed cardiac arrest.  IMPRESSION: 1. No acute intracranial abnormalities. 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 Electronically Signed   By: Lajean Manes M.D.   On: 08/27/2016 16:15   Ct Angio Chest Pe W And/or Wo Contrast  Addendum Date: 08/27/2016   ADDENDUM REPORT: 08/27/2016 19:56 ADDENDUM: Endotracheal tube terminates in the right mainstem bronchus, as dictated in the original report. Retracting approximately 3-4 cm should better position the tip above the carina. Critical Value/emergent results were called by telephone at the time of interpretation on 08/27/2016 at 7:55 pm to [facility], who verbally acknowledged these results. Electronically Signed   By: Lorin Picket M.D.  On: 08/27/2016 19:56   Result Date: 08/27/2016 CLINICAL DATA:  Cardiac arrest. EXAM: CT ANGIOGRAPHY CHEST CT ABDOMEN AND PELVIS WITH CONTRAST  IMPRESSION: 1. Poor assessment of the lobar, segmental and subsegmental pulmonary arteries due to excessive respiratory motion. No definite central pulmonary embolus. 2. Dependent pulmonary parenchymal collapse/consolidation bilaterally. Aspiration and pneumonia are not excluded. 3. Hepatic lesions, previously characterized as hemangiomas on 03/03/2003. Images are not available for comparison. Some lesions measure larger on today's study. If further assessment is desired, MR abdomen without and with contrast is recommended, when the patient is clinically stable and able to hold his breath, preferably as an outpatient. 4. Bladder wall appears thickened. 5.  Aortic atherosclerosis (ICD10-170.0). Electronically Signed: By: Lorin Picket M.D. On: 08/27/2016 16:37   Dg  Chest Port 1 View  Result Date: 08/27/2016 CLINICAL DATA:  Endotracheal tube placement.  Initial encounter. EXAM: PORTABLE CHEST 1 VIEW COMPARISON:  CTA of the chest performed earlier today at 3:47 p.m. FINDINGS: The patient's endotracheal tube is seen ending 1-2 cm above the carina. This could be retracted 1-2 cm. An enteric tube is noted extending below the diaphragm. Bilateral airspace opacification is noted, with underlying vascular congestion. This may reflect pulmonary edema or pneumonia. No pleural effusion or pneumothorax is seen. The cardiomediastinal silhouette is enlarged. No acute osseous abnormalities are seen. An external pacing pad is noted. IMPRESSION: 1. Endotracheal tube seen ending 1-2 cm above the carina. This could be retracted 1-2 cm. 2. Bilateral airspace opacification, with underlying vascular congestion. This may reflect pulmonary edema or pneumonia. 3.  On: 08/27/2016 15:16   TELE: PVCs, couplets  ECG: SR, LBBB  TTE: 08/28/2016 - Left ventricle: The cavity size was mildly dilated. Wall   thickness was increased in a pattern of severe LVH. Systolic   function was moderately reduced. The estimated ejection fraction   was in the range of 35% to 40%. Akinesis of the basalinferior and   apical myocardium. Features are consistent with a pseudonormal   left ventricular filling pattern, with concomitant abnormal   relaxation and increased filling pressure (grade 2 diastolic   dysfunction). - Aortic valve: There was mild regurgitation. - Mitral valve: There was mild regurgitation. - Left atrium: The atrium was severely dilated.    ASSESSMENT AND PLAN  1. S/P cardiac arrest, minimal troponin elevation, most probably sec to shock applied by EMS for VT/VF, ECG shows unchanged LBBB since 2014, TTE shows pattern of severe LVH. Systolic function was moderately reduced. The estimated ejection fraction was in the range of 35% to 40%. Akinesis of the basalinferior and apical  myocardium. Rewarmed, off pressors, extubated, still confused and agitated at times. If he continues to recover for cardiac catheterization early next week.  2. Acute CHF - he continues to diurese significantly and another -2.3 L overnight on low-dose of IV Lasix 20 mg iv Q12H, creatinine improved to 1.1 today, will continue the same dose of Lasix.  3. Hypertension - we'll further increase carvedilol to 25 mg by mouth twice a day, we will start Imdur 30 in hydralazine 25 by mouth 3 times a day.  Signed, Ena Dawley MD, [facility] 09/01/2016

## 2016-09-01 NOTE — Progress Notes (Signed)
PT Cancellation Note  Patient Details Name: Dean [name] MRN: [id number] DOB: Apr 18, 1951   Cancelled Treatment:    Reason Eval/Treat Not Completed: Lethargy limiting ability to participate.    Shary Decamp Maycok 09/01/2016, 12:18 PM [employer] PT [phone]

## 2016-09-01 NOTE — Progress Notes (Signed)
PULMONARY / CRITICAL CARE MEDICINE   Name: Dean [name] MRN: [id number] DOB: June 14, 1951    ADMISSION DATE:  08/27/2016   REFERRING MD:  EDP  CHIEF COMPLAINT:   Post arrest   HISTORY OF PRESENT ILLNESS:   65 yo MO (310 lbs 5'5") AAM who was walking back from the bathroom 12/3 when he collapsed pulseless. CPR wa not performed by family and EMS arrived in 10 minutes and performed CPR x 21 minutes, 7 rounds of epi, 5 shocks, amiodarone and magnesium. He coded again with ROSC. King airway was changed by EDP. He was taken to CT scanner with NAD of brain, transfer to CCU and cardiology was called. Hypothermia protocol was initiated at 1545 after ct head. His prognosis for meaningful recover is poor.  SUBJECTIVE:  Acutely confused overnight, pulled CVC, ativan ordered. Mittens in place   VITAL SIGNS: BP (S) (!) 175/67 (BP Location: Right Arm) Comment: pt agitated and will not be still for BP check  Pulse 96   Temp 97.9 F (36.6 C) (Oral)   Resp (!) 22   Ht 5\' 5"  (1.651 m)   Wt 120.1 kg (264 lb 12.4 oz)   SpO2 95%   BMI 44.06 kg/m   HEMODYNAMICS: CVP:  [2 mmHg-3 mmHg] 3 mmHg  VENTILATOR SETTINGS:    INTAKE / OUTPUT: I/O last 3 completed shifts: In: 815.9 [I.V.:400.9; IV Piggyback:415] Out: 2550 [Urine:2550]  PHYSICAL EXAMINATION: General:  Adult male, lethargic, no acute distress  Neuro:  Moves extremities with physical stimulation, opens eyes, following commands, weak cough  HEENT:  normocephalic  Cardiovascular:  RRR, S1/S2, no MRG Lungs: Diffuse rhonchi throughout, unlabored, on nasal cannula   Abdomen: Obese soft  Musculoskeletal: no deformities  Skin: dry, intact   LABS:  BMET  Recent Labs Lab 08/30/16 0720 08/31/16 0427 09/01/16 0248  NA 143 144 149*  K 3.5 3.3* 3.6  CL 106 107 110  CO2 26 28 30   BUN 11 16 17   CREATININE 1.14 1.27* 1.10  GLUCOSE 106* 110* 105*    Electrolytes  Recent Labs Lab 08/30/16 0720 08/31/16 0427 09/01/16 0248  CALCIUM  9.4 9.5 9.9  MG 1.8 1.8 2.2  PHOS 2.3* 2.7 3.8    CBC  Recent Labs Lab 08/30/16 0720 08/31/16 0427 09/01/16 0248  WBC 13.6* 13.0* 9.5  HGB 11.2* 10.5* 10.6*  HCT 35.6* 33.3* 34.0*  PLT 197 226 209    Coag's  Recent Labs Lab 08/27/16 1705 08/28/16 0011 08/29/16 0350  APTT 26 34  --   INR 1.13 1.17 1.65    Sepsis Markers  Recent Labs Lab 08/27/16 1326 08/27/16 1937 08/28/16 0400 08/29/16 0350  LATICACIDVEN 5.88*  --   --  1.4  PROCALCITON  --  1.26 1.85 1.26    ABG  Recent Labs Lab 08/27/16 1303 08/27/16 1732 08/29/16 0427  PHART 7.229* 7.538* 7.349*  PCO2ART 55.4* 26.2* 39.7  PO2ART 24.0* 207.0* 95.0    Liver Enzymes No results for input(s): AST, ALT, ALKPHOS, BILITOT, ALBUMIN in the last 168 hours.  Cardiac Enzymes  Recent Labs Lab 08/27/16 2011 08/28/16 0400 08/28/16 1011  TROPONINI 2.61* 2.73* 1.99*    Glucose  Recent Labs Lab 08/31/16 1147 08/31/16 1608 08/31/16 2034 08/31/16 2349 09/01/16 0539 09/01/16 0858  GLUCAP 104* 107* 105* 120* 89 91    Imaging No results found.   STUDIES:  Echo > EF 35-40% grade 2 DD  CULTURES: 12/3 bc>>negative   ANTIBIOTICS:  SIGNIFICANT EVENTS: 12/3 Cardiac arrest  12/5 >  Re-Warmed, off sedation  12/7 extubated   LINES/TUBES: 12/3 ET>>12/7 12/3 left I J CVL>>12/8  DISCUSSION: 65 yo MO (310 lbs 5'5") AAM who was walking back from the bathroom 12/3 when he collapsed pulseless. CPR wa not performed by family and EMS arrived in 10 minutes and performed CPR x 21 minutes, 7 rounds of epi, 5 shocks, amiodarone and magnesium. He coded again with ROSC. King airway was changed by EDP. He was taken to CT scanner with NAD of brain, transfer to CCU and cardiology was called. Hypothermia protocol was initiated at 1545 after ct head.  ASSESSMENT / PLAN:  PULMONARY A: Post cardiac arrest 12/3 with 31 minutes down Hx of Asthma P:   Supplemental oxygen for saturation >92 Follow  CXR  CARDIOVASCULAR A:  VT / VF arrest, ? Secondary to resp process Diastolic HF - grade 2 diastolic dysfunction ef [id number] P:  Cardiology following Cardiac Monitoring  Continue Coreg, hydralazine, lisinopril Lasix scheduled q12h Defer L cath for now - per Cards   RENAL A:   ARI hypernatremia  P:   Avoid nephrotoxins Monitor BMP and UOP with current support Net negative, decreased freq of his lasix with rising S Cr Add D5 @ 50 ml/hr   GASTROINTESTINAL A:   GI protection P:   PPI  HEMATOLOGIC A:   DVT prophylaxis P:  Heparin sq Trend CBC   INFECTIOUS A:   No overt infection P:   Trend fever and WBC curve   ENDOCRINE A:   DM P:   SSI  NEUROLOGIC A:   Post Cardiac arrest CT head appears negative Agitation - delirium ? Vs anoxic injury  P:   RASS goal:-1 to 0 Neurology following  Avoid sedative medications    FAMILY  - Updates:  Updated per Dr. Nelda Marseille 12/3, by Dr Lamonte Sakai 12/4 - 12/7.   - Inter-disciplinary family meet or Palliative Care meeting due by: 09/03/16  Hayden Pedro, AG-ACNP [facility]  Pgr: [phone]  PCCM Pgr: [phone]  Attending Note:  I have examined patient, reviewed labs, studies and notes myself. I have discussed the case with Beaulah Corin, and I agree with the data and plans as amended above. 65 yo man w hx diastolic CHF, s/p VT/VF arrest, prolonged CPR. He was successfully extubated but course now consistent with residual anoxic encephalopathy, resultant agitated delirium. He is protecting his airway, but has been difficult to manage, impulsive. Has pulled central line out overnight. Received ativan and haldol with mixed success. On my eval today he will answer questions, is poorly oriented, has no insight into current situation. He is sideways in the bed and prefers to move to that position. He has some UA noise on inspiration but phonates. Lungs coarse. We will try to use d5w for hypernatremia. AVOID  benzos that will contribute to his delirium. Will try to use haldol if QT-c is < 0.5 - Risperdal not currently an option while NPO. Check ECG now. Could consider precedex.  Independent critical care time is 31 minutes.   Baltazar Apo, MD, PhD 09/01/2016, 10:52 AM [facility] [phone] or if no answer [phone]

## 2016-09-01 NOTE — Progress Notes (Signed)
[facility] Progress Note Patient Name: Dean [name] DOB: Oct 19, 1950 MRN: [id number]   Date of Service  09/01/2016  HPI/Events of Note  Patient attempting to get out of bed - request for restraints.   eICU Interventions  Will order: 1. Bilateral soft wrist and waist belt restraints.     Intervention Category Minor Interventions: Agitation / anxiety - evaluation and management  [name] [place] 09/01/2016, 6:08 PM

## 2016-09-01 NOTE — Progress Notes (Signed)
SLP Cancellation Note  Patient Details Name: Mills Mckaig MRN: [id number] DOB: 06/19/1951   Cancelled treatment:       Reason Eval/Treat Not Completed: Patient's level of consciousness. Will plan to f/u on Monday as pt expected to have slow recovery per RN. Call SLP at [phone] over the weekend if swallow eval desired.    Anais Koenen, Katherene Ponto 09/01/2016, 11:30 AM

## 2016-09-01 NOTE — Progress Notes (Signed)
Order for restraints has not been initiated, because patient has been resting and has not needed them.  Will continue to monitor patient for changes in agitation that could be threatening to patient safety.

## 2016-09-01 NOTE — Progress Notes (Signed)
Pt became increasingly confused around 2330. Upon RN entering room, pt had removed gown, mittens, pulse ox, nasal cannula, and central line. Petroleum and gauze applied to L neck over central line site. Pt continues to be confused and pulling at lines, etc. Will continue to monitor closely and update MD as needed.

## 2016-09-01 NOTE — Progress Notes (Signed)
eLink Physician-Brief Progress Note Patient Name: Sultan Kettner DOB: April 21, 1951 MRN: [id number]   Date of Service  09/01/2016  HPI/Events of Note  Agitation and confusion.  Not combative but pulling at lines.  Extubated today.  Sats are in the high 90s with RR in the 20s.  HD stable.  Cannot camera into room via [place].  QTc greater than 500 on last EKG.  eICU Interventions  Plan: Ativan 1 mg IV times one for agitation.     Intervention Category Intermediate Interventions: Other:  Avelynn Sellin 09/01/2016, 1:32 AM

## 2016-09-01 NOTE — Progress Notes (Signed)
Patient confused and attempting to get OOB.  Floor pads placed and [employer] at bedside.  Orders placed for safety sitter.  Will continue to closely monitor.

## 2016-09-02 LAB — CBC
HCT: 39.3 % (ref 39.0–52.0)
Hemoglobin: 12.1 g/dL — ABNORMAL LOW (ref 13.0–17.0)
MCH: 26.5 pg (ref 26.0–34.0)
MCHC: 30.8 g/dL (ref 30.0–36.0)
MCV: 86.2 fL (ref 78.0–100.0)
Platelets: 232 10*3/uL (ref 150–400)
RBC: 4.56 MIL/uL (ref 4.22–5.81)
RDW: 14 % (ref 11.5–15.5)
WBC: 12.1 10*3/uL — ABNORMAL HIGH (ref 4.0–10.5)

## 2016-09-02 LAB — BASIC METABOLIC PANEL
Anion gap: 12 (ref 5–15)
BUN: 16 mg/dL (ref 6–20)
CO2: 27 mmol/L (ref 22–32)
Calcium: 10.3 mg/dL (ref 8.9–10.3)
Chloride: 109 mmol/L (ref 101–111)
Creatinine, Ser: 1.02 mg/dL (ref 0.61–1.24)
GFR calc Af Amer: >60 mL/min (ref >60)
GFR calc non Af Amer: >60 mL/min (ref >60)
Glucose, Bld: 115 mg/dL — ABNORMAL HIGH (ref 65–99)
Potassium: 3.5 mmol/L (ref 3.5–5.1)
Sodium: 148 mmol/L — ABNORMAL HIGH (ref 135–145)

## 2016-09-02 LAB — GLUCOSE, CAPILLARY
Glucose-Capillary: 102 mg/dL — ABNORMAL HIGH (ref 65–99)
Glucose-Capillary: 103 mg/dL — ABNORMAL HIGH (ref 65–99)
Glucose-Capillary: 109 mg/dL — ABNORMAL HIGH (ref 65–99)

## 2016-09-02 LAB — MAGNESIUM: Magnesium: 2.2 mg/dL (ref 1.7–2.4)

## 2016-09-02 LAB — PHOSPHORUS: Phosphorus: 2.9 mg/dL (ref 2.5–4.6)

## 2016-09-02 MED ORDER — DEXMEDETOMIDINE HCL IN NACL 200 MCG/50ML IV SOLN
0.2000 ug/kg/h | INTRAVENOUS | Status: DC
  Administered 2016-09-02: 0.2 ug/kg/h via INTRAVENOUS
  Administered 2016-09-02: 0.4 ug/kg/h via INTRAVENOUS
  Administered 2016-09-03 (×4): 0.3 ug/kg/h via INTRAVENOUS
  Administered 2016-09-03: 0.4 ug/kg/h via INTRAVENOUS
  Administered 2016-09-03: 0.3 ug/kg/h via INTRAVENOUS
  Administered 2016-09-04 (×2): 0.2 ug/kg/h via INTRAVENOUS
  Administered 2016-09-04: 0.3 ug/kg/h via INTRAVENOUS
  Administered 2016-09-05: 0.2 ug/kg/h via INTRAVENOUS
  Filled 2016-09-02 (×11): qty 50

## 2016-09-02 MED ORDER — POTASSIUM CHLORIDE CRYS ER 20 MEQ PO TBCR: 40.0000 meq | EXTENDED_RELEASE_TABLET | Freq: Once | ORAL | Status: DC

## 2016-09-02 NOTE — Evaluation (Addendum)
Clinical/Bedside Swallow Evaluation Patient Details  Name: Dean [name] MRN: [id number] Date of Birth: 1951-07-06  Today's Date: 09/02/2016 Time: SLP Start Time (ACUTE ONLY): [id number] SLP Stop Time (ACUTE ONLY): 1435 SLP Time Calculation (min) (ACUTE ONLY): 20 min  Past Medical History:  Past Medical History:  Diagnosis Date  . Asthma   . Diabetes mellitus without complication ([place])   . Gout   . Hypertension    Past Surgical History: No past surgical history on file. HPI:  Pt is a 65 y.o. male with PMH of asthma, DM, gout, HTN who callapsed pulseless on 12/3 requiring CPR x 21 minutes, 7 rounds of epi, 5 shocks, amiodarone and magnesium, was intubated. EEG 12/4 was markedly abnormal wiht severe diffuse cerebral dysfunction. Pt was extubated 12/7. Pt reportedly doing better on this date, family wanting him to eat. Bedside swallow eval ordered. CXR 12/7 showed increasing bibasilar atelectasis and small left effusion.   Assessment / Plan / Recommendation Clinical Impression  Pt currently has multiple risk factors for aspiration, presenting with a breathy vocal quality, occasional wheezing following PO trials, weak cough, and confusion/ impulsivity, along with being post- 4 day intubation. Recommend continuing NPO status with meds via alternative means but would recommend allowing ice chips (4-5 following oral care). Reviewed results/ recommendations with daughter over the phone, who is in agreement but would like for pt to be able to drink soon. Will continue to follow for PO readiness vs need for objective evaluation.     Aspiration Risk  Severe aspiration risk    Diet Recommendation NPO;Ice chips PRN after oral care   Medication Administration: Via alternative means Postural Changes: Seated upright at 90 degrees (for ice chips)    Other  Recommendations Oral Care Recommendations: Oral care QID;Oral care prior to ice chip/H20 Other Recommendations: Have oral suction available   Follow  up Recommendations Other (comment) (TBD)      Frequency and Duration min 2x/week  2 weeks       Prognosis Prognosis for Safe Diet Advancement: Good Barriers to Reach Goals: Cognitive deficits      Swallow Study   General HPI: Pt is a 65 y.o. male with PMH of asthma, DM, gout, HTN who callapsed pulseless on 12/3 requiring CPR x 21 minutes, 7 rounds of epi, 5 shocks, amiodarone and magnesium, was intubated. EEG 12/4 was markedly abnormal wiht severe diffuse cerebral dysfunction. Pt was extubated 12/7. Pt reportedly doing better on this date, family wanting him to eat. Bedside swallow eval ordered. CXR 12/7 showed increasing bibasilar atelectasis and small left effusion. Type of Study: Bedside Swallow Evaluation Previous Swallow Assessment: none on record Diet Prior to this Study: NPO Temperature Spikes Noted: No Respiratory Status: Room air History of Recent Intubation: Yes Length of Intubations (days): 4 days Date extubated: 08/31/16 Behavior/Cognition: Alert;Cooperative;Requires cueing Oral Cavity Assessment: Within Functional Limits Oral Cavity - Dentition: Adequate natural dentition Self-Feeding Abilities: Total assist (restrained) Patient Positioning: Upright in bed Baseline Vocal Quality: Hoarse (hoarse with intermittent aphonia) Volitional Cough: Weak    Oral/Motor/Sensory Function Overall Oral Motor/Sensory Function: Within functional limits   Ice Chips Ice chips: Within functional limits   Thin Liquid Thin Liquid: Impaired Presentation: Cup;Spoon Pharyngeal  Phase Impairments: Other (comments);Cough - Delayed (wheezing )    Nectar Thick Nectar Thick Liquid: Not tested   Honey Thick Honey Thick Liquid: Not tested   Puree Puree: Impaired Presentation: Spoon Pharyngeal Phase Impairments: Other (comments) (wheezing)   Solid   GO   Solid: Not tested  Kern Reap, MA, CCC-SLP 09/02/2016,2:52 PM [phone]

## 2016-09-02 NOTE — Progress Notes (Signed)
PT Cancellation Note  Patient Details Name: Dean [name] MRN: [id number] DOB: [birth year]   Cancelled Treatment:    Reason Eval/Treat Not Completed: Other (comment). Per nurse's report, pt very agitated this AM requiring at least four people to safely assist back to bed as pt slid himself OOB and attempting to stand unassisted. Pt very unsafe at this time and not following verbal commands. Pt currently with bilateral wrist restraints, mittens and posey belt in place. PT will continue to f/u with pt as appropriate.   Clearnce Sorrel Margerite Impastato 09/02/2016, 9:33 AM Sherie Don, PT, DPT [phone]

## 2016-09-02 NOTE — Progress Notes (Signed)
eLink Physician-Brief Progress Note Patient Name: Dean [name] DOB: Feb 13, 1951 MRN: [id number]   Date of Service  09/02/2016  HPI/Events of Note  Request to renew restraint orders.  eICU Interventions  Restraint orders renewed.      Intervention Category Minor Interventions: Agitation / anxiety - evaluation and management  Leylani Duley Eugene 09/02/2016, 7:00 PM

## 2016-09-02 NOTE — Progress Notes (Signed)
eLink Physician-Brief Progress Note Patient Name: Dean [name] DOB: 01/27/1951 MRN: [id number]   Date of Service  09/02/2016  HPI/Events of Note  Delirium/Severe Agitation.  eICU Interventions  Will order: 1. Precedex IV infusion. Titrate to RASS = 0.     Intervention Category Major Interventions: Delirium, psychosis, severe agitation - evaluation and management  Nolyn Eilert Eugene 09/02/2016, 3:28 PM

## 2016-09-02 NOTE — Progress Notes (Signed)
Patient Name: Dean [name] Date of Encounter: 09/02/2016  Primary Cardiologist:    Dr. [facility] Problem List     Principal Problem:   Cardiopulmonary arrest [facility]) Active Problems:   DM2 (diabetes mellitus, type 2) ([place])   Morbid obesity ([place])   Essential hypertension, benign   Acute encephalopathy   Acute respiratory failure ([place])     Subjective   The patient is awake and alert and answers questions.  Still some confusion. No pain or SOB  Inpatient Medications    Scheduled Meds: . chlorhexidine  15 mL Mouth Rinse BID  . docusate  100 mg Per Tube Daily  . famotidine (PEPCID) IV  20 mg Intravenous Q12H  . furosemide  20 mg Intravenous Q12H  . heparin  5,000 Units Subcutaneous Q8H  . hydrALAZINE  25 mg Oral Q8H  . insulin aspart  0-15 Units Subcutaneous Q4H  . isosorbide mononitrate  30 mg Oral Daily  . mouth rinse  15 mL Mouth Rinse q12n4p  . mouth rinse  15 mL Mouth Rinse BID  . metoprolol  5 mg Intravenous Q6H  . sodium chloride flush  10-40 mL Intracatheter Q12H   Continuous Infusions: . sodium chloride Stopped (08/31/16 2300)  . dextrose 50 mL/hr at 09/01/16 2047   PRN Meds: sodium chloride, acetaminophen (TYLENOL) oral liquid 160 mg/5 mL, haloperidol lactate, hydrALAZINE, sodium chloride flush   Vital Signs    Vitals:   09/02/16 0442 09/02/16 0500 09/02/16 0600 09/02/16 0614  BP: (!) 151/37   (!) 148/47  Pulse: 82 83 76 71  Resp: (!) 24 (!) 25 (!) 23 (!) 21  Temp:      TempSrc:      SpO2: 95% 99% 95% 94%  Weight:      Height:        Intake/Output Summary (Last 24 hours) at 09/02/16 0858 Last data filed at 09/02/16 0600  Gross per 24 hour  Intake           560.83 ml  Output              335 ml  Net           225.83 ml   Filed Weights   08/27/16 1251 08/31/16 0230  Weight: (!) 310 lb (140.6 kg) 264 lb 12.4 oz (120.1 kg)    Physical Exam    GEN: NAD.  Neck:  No  JVD Cardiac: Regular Rate and Rhythm, no murmurs, rubs, or gallops.   noedema.  Radials/DP/PT 2+  and equal bilaterally.  Respiratory:  Respirations  regular and unlabored, clear to auscultation bilaterally. GI: Soft, nontender, nondistended, BS + x 4. Skin: warm and dry, no rash. Neuro:   Strength and sensation are intact. Psych:  AAOx3.  Normal affect.  Labs    CBC  Recent Labs  09/01/16 0248 09/02/16 0541  WBC 9.5 12.1*  HGB 10.6* 12.1*  HCT 34.0* 39.3  MCV 85.9 86.2  PLT 209 [id number]   Basic Metabolic Panel  Recent Labs  09/01/16 0248 09/02/16 0541  NA 149* 148*  K 3.6 3.5  CL 110 109  CO2 30 27  GLUCOSE 105* 115*  BUN 17 16  CREATININE 1.10 1.02  CALCIUM 9.9 10.3  MG 2.2 2.2  PHOS 3.8 2.9   Liver Function Tests  Recent Labs  09/01/16 1019  AST 68*  ALT 62  ALKPHOS 83  BILITOT 3.7*  PROT 7.5  ALBUMIN 3.3*   No results for input(s): LIPASE, AMYLASE  in the last 72 hours. Cardiac Enzymes No results for input(s): CKTOTAL, CKMB, CKMBINDEX, TROPONINI in the last 72 hours. BNP Invalid input(s): POCBNP D-Dimer No results for input(s): DDIMER in the last 72 hours. Hemoglobin A1C No results for input(s): HGBA1C in the last 72 hours. Fasting Lipid Panel No results for input(s): CHOL, HDL, LDLCALC, TRIG, CHOLHDL, LDLDIRECT in the last 72 hours. Thyroid Function Tests No results for input(s): TSH, T4TOTAL, T3FREE, THYROIDAB in the last 72 hours.  Invalid input(s): FREET3  Telemetry    PVCs, sinus - Personally Reviewed  ECG    NSR, rate 76, LBBB - Personally Reviewed  Radiology    No results found.  Cardiac Studies   TTE: 08/28/2016 - Left ventricle: The cavity size was mildly dilated. Wall thickness was increased in a pattern of severe LVH. Systolic function was moderately reduced. The estimated ejection fraction was in the range of 35% to 40%. Akinesis of the basalinferior and apical myocardium. Features are consistent with a pseudonormal left ventricular filling pattern, with concomitant  abnormal relaxation and increased filling pressure (grade 2 diastolic dysfunction). - Aortic valve: There was mild regurgitation. - Mitral valve: There was mild regurgitation. - Left atrium: The atrium was severely dilated.  Patient Profile     This is a 65 y.o. male with a history of type 2 diabetes, asthma and hypertension and presented after a witnessed cardiac arrest.  He had prolonged CPR.  He has chronic LBBB  Assessment & Plan    Status post cardiac arrest:   Plan for cardiac cath probably sometime next week when he is able to cooperate with all instructions.    Acute systolic HF:  EF about [id number].    Meds as below.   Hypertension:    Carvedilol increased and Imdur and hydralazine started yesterday.   Signed, Minus Breeding, MD  09/02/2016, 8:58 AM

## 2016-09-02 NOTE — Progress Notes (Signed)
Patient made attempt to get out of bed. Patient was on his stomach with his feet on the ground. Restraints initiated at this time. Patient has bilateral wrist restraints and posey belt. VSS, patient denies pain. Patient placed in recliner position. Will continue to monitor. - Soyla Dryer, RN

## 2016-09-02 NOTE — Progress Notes (Signed)
PULMONARY / CRITICAL CARE MEDICINE   Name: Dean [name] MRN: [id number] DOB: 27-Feb-1951    ADMISSION DATE:  08/27/2016   REFERRING MD:  EDP  CHIEF COMPLAINT:   Post arrest   brief 65 yo MO (310 lbs 5'5") AAM who was walking back from the bathroom 12/3 when he collapsed pulseless. CPR wa not performed by family and EMS arrived in 10 minutes and performed CPR x 21 minutes, 7 rounds of epi, 5 shocks, amiodarone and magnesium. He coded again with ROSC. King airway was changed by EDP. He was taken to CT scanner with NAD of brain, transfer to CCU and cardiology was called. Hypothermia protocol was initiated at 1545 after ct head. His prognosis for meaningful recover is poor.  Events  12/8 Acutely confused overnight, pulled CVC, ativan ordered. Mittens in place   . SUBJECTIVE/OVERNIGHT/INTERVAL HX 12/9 - wife and daughter at bedside - feel encephalopathy is rapidly improving. sTill sleepy but much better. They think he is ready to eat. STill per RN can slip out of bed and needs siter. No on infusion. opulse ox 97% 2L [place]. 94% on RA x 71min  VITAL SIGNS: BP (!) 187/126   Pulse 71   Temp 98.5 F (36.9 C) (Oral)   Resp (!) 21   Ht 5\' 5"  (1.651 m)   Wt 120.1 kg (264 lb 12.4 oz)   SpO2 94%   BMI 44.06 kg/m   HEMODYNAMICS:    VENTILATOR SETTINGS:    INTAKE / OUTPUT: I/O last 3 completed shifts: In: 670.8 [I.V.:520.8; IV Piggyback:150] Out: 2095 [Urine:2095]  PHYSICAL EXAMINATION: General:  Adult male, bit lethargic, no acute distress  Neuro:  Moves extremities with physical stimulation, opens eyes, following commands, weak cough but improved HEENT:  normocephalic  Cardiovascular:  RRR, S1/S2, no MRG Lungs: Diffuse rhonchi throughout, unlabored, on nasal cannula   Abdomen: Obese soft  Musculoskeletal: no deformities  Skin: dry, intact   LABS:  BMET  Recent Labs Lab 08/31/16 0427 09/01/16 0248 09/02/16 0541  NA 144 149* 148*  K 3.3* 3.6 3.5  CL 107 110 109  CO2 28  30 27   BUN 16 17 16   CREATININE 1.27* 1.10 1.02  GLUCOSE 110* 105* 115*    Electrolytes  Recent Labs Lab 08/31/16 0427 09/01/16 0248 09/02/16 0541  CALCIUM 9.5 9.9 10.3  MG 1.8 2.2 2.2  PHOS 2.7 3.8 2.9    CBC  Recent Labs Lab 08/31/16 0427 09/01/16 0248 09/02/16 0541  WBC 13.0* 9.5 12.1*  HGB 10.5* 10.6* 12.1*  HCT 33.3* 34.0* 39.3  PLT 226 209 232    Coag's  Recent Labs Lab 08/27/16 1705 08/28/16 0011 08/29/16 0350  APTT 26 34  --   INR 1.13 1.17 1.65    Sepsis Markers  Recent Labs Lab 08/27/16 1326 08/27/16 1937 08/28/16 0400 08/29/16 0350  LATICACIDVEN 5.88*  --   --  1.4  PROCALCITON  --  1.26 1.85 1.26    ABG  Recent Labs Lab 08/27/16 1303 08/27/16 1732 08/29/16 0427  PHART 7.229* 7.538* 7.349*  PCO2ART 55.4* 26.2* 39.7  PO2ART 24.0* 207.0* 95.0    Liver Enzymes  Recent Labs Lab 09/01/16 1019  AST 68*  ALT 62  ALKPHOS 83  BILITOT 3.7*  ALBUMIN 3.3*    Cardiac Enzymes  Recent Labs Lab 08/27/16 2011 08/28/16 0400 08/28/16 1011  TROPONINI 2.61* 2.73* 1.99*    Glucose  Recent Labs Lab 09/01/16 0858 09/01/16 1138 09/01/16 1655 09/01/16 2041 09/02/16 0015 09/02/16 0445  GLUCAP 91 94 124* 98 102* 103*    Imaging No results found.   STUDIES:  Echo > EF 35-40% grade 2 DD  CULTURES: 12/3 bc>>negative   ANTIBIOTICS:  SIGNIFICANT EVENTS: 12/3 Cardiac arrest  12/5 > Re-Warmed, off sedation  12/7 extubated   LINES/TUBES: 12/3 ET>>12/7 12/3 left I J CVL>>12/8  DISCUSSION: 65 yo MO (310 lbs 5'5") AAM who was walking back from the bathroom 12/3 when he collapsed pulseless. CPR wa not performed by family and EMS arrived in 10 minutes and performed CPR x 21 minutes, 7 rounds of epi, 5 shocks, amiodarone and magnesium. He coded again with ROSC. King airway was changed by EDP. He was taken to CT scanner with NAD of brain, transfer to CCU and cardiology was called. Hypothermia protocol was initiated at 1545  after ct head.  ASSESSMENT / PLAN:  PULMONARY A: Post cardiac arrest 12/3 with 31 minutes down Hx of Asthma   - doing well on RA 92% P:   Supplemental oxygen for saturation >92 I fneeded  Follow CXR  CARDIOVASCULAR A:  VT / VF arrest, ? Secondary to resp process Diastolic HF - grade 2 diastolic dysfunction ef [id number]  QTC 564msec  On 09/02/16  P:  kcl supplementation and dc haldol Cardiology following Cardiac Monitoring  Continue Coreg, hydralazine, lisinopril Lasix scheduled q12h Defer L cath for now - per Cards   RENAL A:   ARI hypernatremia  P:   Avoid nephrotoxins Monitor BMP and UOP with current support Net negative, decreased freq of his lasix with rising S Cr  D5 @ 50 ml/hr   GASTROINTESTINAL A:   GI protection P:   PPI Await swallow eval  HEMATOLOGIC A:   DVT prophylaxis P:  Heparin sq Trend CBC   INFECTIOUS A:   No overt infection P:   Trend fever and WBC curve   ENDOCRINE A:   DM P:   SSI  NEUROLOGIC A:   Post Cardiac arrest CT head appears negative Agitation - delirium ? Vs anoxic injury    - rapidly improving  P:   RASS goal:-1 to 0 Neurology following  Avoid sedative medications  Avoid qtc prolongation - dc haldol prn Sitter at bedside  FAMILY  - Updates:  Updated per Dr. Nelda Marseille 12/3, by Dr Lamonte Sakai 12/4 - 12/7.  DR Ronie Spies family 09/02/16  - Inter-disciplinary family meet or Palliative Care meeting due by: 09/03/16   The patient is critically ill with multiple organ systems failure and requires high complexity decision making for assessment and support, frequent evaluation and titration of therapies, application of advanced monitoring technologies and extensive interpretation of multiple databases.   Critical Care Time devoted to patient care services described in this note is  30  Minutes. This time reflects time of care of this signee Dr Brand Males. This critical care time does not reflect procedure time, or  teaching time or supervisory time of PA/NP/Med student/Med Resident etc but could involve care discussion time    Dr. Brand Males, M.D., [facility].C.P Pulmonary and Critical Care Medicine Staff Physician [place] [facility] Pager: [phone], If no answer or between  15:00h - 7:00h: call [phone]  09/02/2016 12:14 PM

## 2016-09-03 LAB — GLUCOSE, CAPILLARY
Glucose-Capillary: 124 mg/dL — ABNORMAL HIGH (ref 65–99)
Glucose-Capillary: 108 mg/dL — ABNORMAL HIGH (ref 65–99)
Glucose-Capillary: 125 mg/dL — ABNORMAL HIGH (ref 65–99)
Glucose-Capillary: 116 mg/dL — ABNORMAL HIGH (ref 65–99)
Glucose-Capillary: 100 mg/dL — ABNORMAL HIGH (ref 65–99)
Glucose-Capillary: 96 mg/dL (ref 65–99)
Glucose-Capillary: 108 mg/dL — ABNORMAL HIGH (ref 65–99)

## 2016-09-03 MED ORDER — FUROSEMIDE 10 MG/ML IJ SOLN
20.0000 mg | Freq: Once | INTRAMUSCULAR | Status: AC
  Administered 2016-09-03: 20 mg via INTRAVENOUS
  Filled 2016-09-03: qty 2

## 2016-09-03 MED ORDER — NITROGLYCERIN 2 % TD OINT
1.0000 [in_us] | TOPICAL_OINTMENT | Freq: Four times a day (QID) | TRANSDERMAL | Status: DC
  Administered 2016-09-03 – 2016-09-12 (×36): 1 [in_us] via TOPICAL
  Filled 2016-09-03 (×2): qty 30

## 2016-09-03 NOTE — Progress Notes (Signed)
eLink Physician-Brief Progress Note Patient Name: Vatche Berman DOB: 29-Jul-1951 MRN: [id number]   Date of Service  09/03/2016  HPI/Events of Note  Notified of need to renew restraint orders.  eICU Interventions  Will renew restraint orders.      Intervention Category Minor Interventions: Agitation / anxiety - evaluation and management  Jumanah Hynson [place] 09/03/2016, 6:45 PM

## 2016-09-03 NOTE — Progress Notes (Signed)
Patient Name: Dean [name] Date of Encounter: 09/03/2016  Primary Cardiologist:    Dr. [facility] Problem List     Principal Problem:   Cardiopulmonary arrest [facility]) Active Problems:   DM2 (diabetes mellitus, type 2) ([place])   Morbid obesity ([place])   Essential hypertension, benign   Acute encephalopathy   Acute respiratory failure ([place])     Subjective   Very agitated yesterday.   Now sedated and asleep.   Inpatient Medications    Scheduled Meds: . chlorhexidine  15 mL Mouth Rinse BID  . docusate  100 mg Per Tube Daily  . famotidine (PEPCID) IV  20 mg Intravenous Q12H  . furosemide  20 mg Intravenous Q12H  . heparin  5,000 Units Subcutaneous Q8H  . hydrALAZINE  25 mg Oral Q8H  . insulin aspart  0-15 Units Subcutaneous Q4H  . isosorbide mononitrate  30 mg Oral Daily  . mouth rinse  15 mL Mouth Rinse q12n4p  . mouth rinse  15 mL Mouth Rinse BID  . metoprolol  5 mg Intravenous Q6H  . potassium chloride  40 mEq Oral Once  . sodium chloride flush  10-40 mL Intracatheter Q12H   Continuous Infusions: . sodium chloride Stopped (08/31/16 2300)  . dexmedetomidine 0.3 mcg/kg/hr (09/03/16 0800)  . dextrose 50 mL (09/03/16 0917)   PRN Meds: sodium chloride, acetaminophen (TYLENOL) oral liquid 160 mg/5 mL, hydrALAZINE, sodium chloride flush   Vital Signs    Vitals:   09/03/16 0600 09/03/16 0700 09/03/16 0800 09/03/16 0801  BP: (!) 112/51 120/60 111/64 111/64  Pulse: (!) 50   63  Resp: 18 20 (!) 21 (!) 21  Temp:    97.5 F (36.4 C)  TempSrc:    Oral  SpO2: 100%   97%  Weight:      Height:        Intake/Output Summary (Last 24 hours) at 09/03/16 0948 Last data filed at 09/03/16 0900  Gross per 24 hour  Intake          1540.15 ml  Output                0 ml  Net          1540.15 ml   Filed Weights   08/27/16 1251 08/31/16 0230  Weight: (!) 310 lb (140.6 kg) 264 lb 12.4 oz (120.1 kg)    Physical Exam    GEN: NAD.  Neck:   Difficult to assess with the  patient lying flat, sedated and large neck, no clear JVD Cardiac: Regular Rate and Rhythm, no murmurs, rubs, or gallops.  noedema.  Radials/DP/PT 2+  and equal bilaterally.  Respiratory:  Respirations  regular and unlabored, clear to auscultation bilaterally, transmitted upper airway sounds GI: Soft, nontender, nondistended, BS + x 4. Skin: warm and dry, no rash. Neuro:   Sedated.   Labs    CBC  Recent Labs  09/01/16 0248 09/02/16 0541  WBC 9.5 12.1*  HGB 10.6* 12.1*  HCT 34.0* 39.3  MCV 85.9 86.2  PLT 209 [id number]   Basic Metabolic Panel  Recent Labs  09/01/16 0248 09/02/16 0541  NA 149* 148*  K 3.6 3.5  CL 110 109  CO2 30 27  GLUCOSE 105* 115*  BUN 17 16  CREATININE 1.10 1.02  CALCIUM 9.9 10.3  MG 2.2 2.2  PHOS 3.8 2.9   Liver Function Tests  Recent Labs  09/01/16 1019  AST 68*  ALT 62  ALKPHOS 83  BILITOT  3.7*  PROT 7.5  ALBUMIN 3.3*   No results for input(s): LIPASE, AMYLASE in the last 72 hours. Cardiac Enzymes No results for input(s): CKTOTAL, CKMB, CKMBINDEX, TROPONINI in the last 72 hours. BNP Invalid input(s): POCBNP D-Dimer No results for input(s): DDIMER in the last 72 hours. Hemoglobin A1C No results for input(s): HGBA1C in the last 72 hours. Fasting Lipid Panel No results for input(s): CHOL, HDL, LDLCALC, TRIG, CHOLHDL, LDLDIRECT in the last 72 hours. Thyroid Function Tests No results for input(s): TSH, T4TOTAL, T3FREE, THYROIDAB in the last 72 hours.  Invalid input(s): FREET3  Telemetry    PVCs, sinus - Personally Reviewed  ECG    NA  Radiology    No results found.  Cardiac Studies   TTE: 08/28/2016 - Left ventricle: The cavity size was mildly dilated. Wall thickness was increased in a pattern of severe LVH. Systolic function was moderately reduced. The estimated ejection fraction was in the range of 35% to 40%. Akinesis of the basalinferior and apical myocardium. Features are consistent with a pseudonormal left  ventricular filling pattern, with concomitant abnormal relaxation and increased filling pressure (grade 2 diastolic dysfunction). - Aortic valve: There was mild regurgitation. - Mitral valve: There was mild regurgitation. - Left atrium: The atrium was severely dilated.  Patient Profile     This is a 65 y.o. male with a history of type 2 diabetes, asthma and hypertension and presented after a witnessed cardiac arrest.  He had prolonged CPR.  He has chronic LBBB  Assessment & Plan    Status post cardiac arrest:   Plan for cardiac cath probably sometime next week when he is able to cooperate with all instructions.   However, he is not near this based on the events of yesterdya.   Acute systolic HF:  EF about [id number].   Not taking POs.  Stop the hydralazine and add NTG paste in place of Imdur until he passes a swallowing study.  He is on IV amiodarone.    Hypertension:    As above.   Signed, Minus Breeding, MD  09/03/2016, 9:48 AM

## 2016-09-03 NOTE — Progress Notes (Signed)
PT Cancellation Note  Patient Details Name: Dean [name] MRN: [id number] DOB: [birth year]   Cancelled Treatment:    Reason Eval/Treat Not Completed: Other (comment)   Discussed pt with RN, who requests we hold today due to need for sedation;   Will check back for appropriateness for PT tomorrow;   Roney Marion, [place] Pager [phone] Office [phone]     Colletta [place] 09/03/2016, 10:10 AM

## 2016-09-03 NOTE — Progress Notes (Addendum)
PULMONARY / CRITICAL CARE MEDICINE   Name: Dean [name] MRN: [id number] DOB: [birth year]    ADMISSION DATE:  08/27/2016   REFERRING MD:  EDP  CHIEF COMPLAINT:   Post arrest   brief 65 yo MO (310 lbs 5'5") AAM who was walking back from the bathroom 12/3 when he collapsed pulseless. CPR wa not performed by family and EMS arrived in 10 minutes and performed CPR x 21 minutes, 7 rounds of epi, 5 shocks, amiodarone and magnesium. He coded again with ROSC. King airway was changed by EDP. He was taken to CT scanner with NAD of brain, transfer to CCU and cardiology was called. Hypothermia protocol was initiated at 1545 after ct head. His prognosis for meaningful recover is poor.  LINES/TUBES: 12/3 ET>>12/7 12/3 left I J CVL>>12/8   SIGNIFICANT EVENTS: 12/3 Cardiac arrest  12/5 > Re-Warmed, off sedation  12/7 extubated    12/8 Acutely confused overnight, pulled CVC, ativan ordered. Mittens in place   12/9 - wife and daughter at bedside - feel encephalopathy is rapidly improving. sTill sleepy but much better. They think he is ready to eat. STill per RN can slip out of bed and needs siter. No on infusion. opulse ox 97% 2L [place]. 94% on RA x 78min   SUBJECTIVE/OVERNIGHT/INTERVAL HX 12/10 = overnight very agitated and since then on precedex gtt. Hypoglycemic/failed sbt - > on d5 infusion. Afebrile since 12/7. + 5L since admit  VITAL SIGNS: BP (!) 116/58   Pulse (!) 51   Temp 97.5 F (36.4 C) (Oral)   Resp (!) 24   Ht 5\' 5"  (1.651 m)   Wt 120.1 kg (264 lb 12.4 oz)   SpO2 98%   BMI 44.06 kg/m   HEMODYNAMICS:    VENTILATOR SETTINGS:    INTAKE / OUTPUT: I/O last 3 completed shifts: In: 1933 [I.V.:1883; IV Piggyback:50] Out: 70 [Urine:335]  PHYSICAL EXAMINATION: General:  Adult male, bit lethargic, no acute distress  Neuro:  Moves extremities with physical stimulation, opens eyes, following commands, weak cough but improved HEENT:  normocephalic  Cardiovascular:  RRR, S1/S2,  no MRG Lungs: Diffuse rhonchi throughout, unlabored, on nasal cannula   Abdomen: Obese soft  Musculoskeletal: no deformities  Skin: dry, intact   LABS: PULMONARY  Recent Labs Lab 08/27/16 1303 08/27/16 1622 08/27/16 1732 08/27/16 1839 08/29/16 0427  PHART 7.229*  --  7.538*  --  7.349*  PCO2ART 55.4*  --  26.2*  --  39.7  PO2ART 24.0*  --  207.0*  --  95.0  HCO3 23.1  --  22.3  --  21.9  TCO2 25 23 23 22 23   O2SAT 32.0  --  100.0  --  97.0    CBC  Recent Labs Lab 08/31/16 0427 09/01/16 0248 09/02/16 0541  HGB 10.5* 10.6* 12.1*  HCT 33.3* 34.0* 39.3  WBC 13.0* 9.5 12.1*  PLT 226 209 232    COAGULATION  Recent Labs Lab 08/27/16 1705 08/28/16 0011 08/29/16 0350  INR 1.13 1.17 1.65    CARDIAC   Recent Labs Lab 08/27/16 1705 08/27/16 2011 08/28/16 0400 08/28/16 1011  TROPONINI 1.54* 2.61* 2.73* 1.99*   No results for input(s): PROBNP in the last 168 hours.   CHEMISTRY  Recent Labs Lab 08/29/16 0350 08/30/16 0720 08/31/16 0427 09/01/16 0248 09/02/16 0541  NA 140 143 144 149* 148*  K 4.3 3.5 3.3* 3.6 3.5  CL 109 106 107 110 109  CO2 23 26 28 30 27   GLUCOSE 102* 106* 110*  105* 115*  BUN 12 11 16 17 16   CREATININE 1.17 1.14 1.27* 1.10 1.02  CALCIUM 8.9 9.4 9.5 9.9 10.3  MG 1.6* 1.8 1.8 2.2 2.2  PHOS 3.1 2.3* 2.7 3.8 2.9   Estimated Creatinine Clearance: 85.5 mL/min (by C-G formula based on SCr of 1.02 mg/dL).   LIVER  Recent Labs Lab 08/27/16 1705 08/28/16 0011 08/29/16 0350 09/01/16 1019  AST  --   --   --  68*  ALT  --   --   --  62  ALKPHOS  --   --   --  83  BILITOT  --   --   --  3.7*  PROT  --   --   --  7.5  ALBUMIN  --   --   --  3.3*  INR 1.13 1.17 1.65  --      INFECTIOUS  Recent Labs Lab 08/27/16 1326 08/27/16 1937 08/28/16 0400 08/29/16 0350  LATICACIDVEN 5.88*  --   --  1.4  PROCALCITON  --  1.26 1.85 1.26     ENDOCRINE CBG (last 3)   Recent Labs  09/02/16 1116 09/02/16 1547 09/02/16 2111   GLUCAP 108* 125* 109*         IMAGING x48h  - image(s) personally visualized  -   highlighted in bold No results found.      STUDIES:  Echo > EF 35-40% grade 2 DD  CULTURES: 12/3 bc>>negative   ANTIBIOTICS: DISCUSSION: 65 yo MO (310 lbs 5'5") AAM who was walking back from the bathroom 12/3 when he collapsed pulseless. CPR wa not performed by family and EMS arrived in 10 minutes and performed CPR x 21 minutes, 7 rounds of epi, 5 shocks, amiodarone and magnesium. He coded again with ROSC. King airway was changed by EDP. He was taken to CT scanner with NAD of brain, transfer to CCU and cardiology was called. Hypothermia protocol was initiated at 1545 after ct head.  ASSESSMENT / PLAN:  PULMONARY A: Post cardiac arrest 12/3 with 31 minutes down Hx of Asthma   - doing well on 3L [place] . Mild upper airwa noise P:   Supplemental oxygen for saturation >92  Follow CXR Lasix x 1  CARDIOVASCULAR A:  VT / VF arrest, ? Secondary to resp process Diastolic HF - grade 2 diastolic dysfunction ef [id number]  QTC 559msec  On 09/02/16 and 400s 09/03/16 per RN   P:  Cardiology following Cardiac Monitoring  Continue Coreg, hydralazine, lisinopril Lasix repeat x 1 09/03/16 and coninue 20bid scheduled since 08/31/16 Defer L cath for now - per Cards   RENAL A:   ARI   + 5L since admit P:   LAsix x 1 Avoid nephrotoxins Monitor BMP and UOP with current support  D5 @ 50 ml/hr   GASTROINTESTINAL A:   GI protection P:   PPI Await swallow eval  HEMATOLOGIC A:   DVT prophylaxis P:  Heparin sq Trend CBC   INFECTIOUS A:   No overt infection P:   Trend fever and WBC curve   ENDOCRINE A:   DM P:   SSI  NEUROLOGIC A:   Post Cardiac arrest CT head appears negative Agitation - delirium ? Vs anoxic injury    - acutely agitated overnight. Now RASS -3 on precedex  P:   RASS goal:-1 to 0 Neurology following  Avoid sedative medications  Avoid qtc prolongation - dc  haldol prn - recheck ekg 09/04/16 E-Sitter at bedside - continue  Continue precedex gtt   FAMILY  - Updates:  Updated per Dr. Nelda Marseille 12/3, by Dr Lamonte Sakai 12/4 - 12/7.  DR Raamswamy family 09/02/16. None at bedside 09/03/16  - Inter-disciplinary family meet or Palliative Care meeting due by: 09/03/16   The patient is critically ill with multiple organ systems failure and requires high complexity decision making for assessment and support, frequent evaluation and titration of therapies, application of advanced monitoring technologies and extensive interpretation of multiple databases.   Critical Care Time devoted to patient care services described in this note is  30  Minutes. This time reflects time of care of this signee Dr Brand Males. This critical care time does not reflect procedure time, or teaching time or supervisory time of PA/NP/Med student/Med Resident etc but could involve care discussion time    Dr. Brand Males, M.D., [facility].C.P Pulmonary and Critical Care Medicine Staff Physician [place] [facility] Pager: [phone], If no answer or between  15:00h - 7:00h: call [phone]  09/03/2016 11:52 AM

## 2016-09-03 NOTE — Progress Notes (Signed)
SLP Cancellation Note  Patient Details Name: Dean [name] MRN: [id number] DOB: [birth year]   Cancelled treatment:       Reason Eval/Treat Not Completed: Patient's level of consciousness. Pt currently sedated per RN. Will continue efforts on next date.   Germain Osgood 09/03/2016, 10:27 AM  Germain Osgood, M.A. CCC-SLP [phone]

## 2016-09-04 ENCOUNTER — Inpatient Hospital Stay (HOSPITAL_COMMUNITY): Payer: Medicare Other

## 2016-09-04 LAB — GLUCOSE, CAPILLARY
Glucose-Capillary: 108 mg/dL — ABNORMAL HIGH (ref 65–99)
Glucose-Capillary: 108 mg/dL — ABNORMAL HIGH (ref 65–99)
Glucose-Capillary: 111 mg/dL — ABNORMAL HIGH (ref 65–99)
Glucose-Capillary: 118 mg/dL — ABNORMAL HIGH (ref 65–99)
Glucose-Capillary: 121 mg/dL — ABNORMAL HIGH (ref 65–99)
Glucose-Capillary: 79 mg/dL (ref 65–99)

## 2016-09-04 NOTE — Progress Notes (Signed)
Rehab Admissions Coordinator Note:  Patient was screened by Retta Diones for appropriateness for an Inpatient Acute Rehab Consult. Noted PT recommending CIR.   At this time, we are recommending Inpatient Rehab consult.  In addition, we will need an order for an OT evaluation if CIR is desired.  Patient has [employer]  Retta Diones 09/04/2016, 3:46 PM  I can be reached at [phone].

## 2016-09-04 NOTE — Evaluation (Signed)
Physical Therapy Evaluation Patient Details Name: Dean [name] MRN: [id number] DOB: [birth year] Today's Date: 09/04/2016   History of Present Illness  Pt is a 65 y.o. male with PMH of asthma, DM, gout, HTN who callapsed pulseless on 12/3. No CPR x 10 minutes then requiring CPR x 21 minutes, 7 rounds of epi, 5 shocks, amiodarone and magnesium, was intubated. EEG 12/4 was markedly abnormal wiht severe diffuse cerebral dysfunction. Pt was extubated 12/7,  PMH - asthma, DM, HTN, gout  Clinical Impression  Pt admitted with above diagnosis and presents to PT with functional limitations due to deficits listed below (See PT problem list). Pt needs skilled PT to maximize independence and safety to allow discharge to CIR. Pt with cognitive and physical deficits resulting in assist needed for all mobility.      Follow Up Recommendations CIR    Equipment Recommendations  Rolling walker with 5" wheels    Recommendations for Other Services OT consult     Precautions / Restrictions Precautions Precautions: Fall Restrictions Weight Bearing Restrictions: No      Mobility  Bed Mobility Overal bed mobility: Needs Assistance Bed Mobility: Supine to Sit     Supine to sit: Mod assist;+2 for safety/equipment     General bed mobility comments: Assist to bring legs off bed and to elevate trunk into sitting  Transfers Overall transfer level: Needs assistance Equipment used: Rolling walker (2 wheeled) Transfers: Sit to/from [facility] Sit to Stand: +2 physical assistance;Mod assist Stand pivot transfers: +2 physical assistance;Min assist       General transfer comment: Assist to bring hips up and for balance to stand. Assist for balance with pivotal steps to chair with walker.  Ambulation/Gait                Stairs            Wheelchair Mobility    Modified Rankin (Stroke Patients Only)       Balance Overall balance assessment: Needs  assistance Sitting-balance support: Bilateral upper extremity supported;Feet supported Sitting balance-Leahy Scale: Poor Sitting balance - Comments: UE support and min guard for safety   Standing balance support: Bilateral upper extremity supported Standing balance-Leahy Scale: Poor Standing balance comment: walker and +2 min A for static standing                             Pertinent Vitals/Pain Pain Assessment: No/denies pain    Home Living Family/patient expects to be discharged to:: Private residence Living Arrangements: Spouse/significant other Available Help at Discharge: Family   Home Access: Level entry     Home Layout: One level Home Equipment: None Additional Comments: Information from pt. Unable to confirm with family if this is accurate.    Prior Function Level of Independence: Independent               Hand Dominance        Extremity/Trunk Assessment   Upper Extremity Assessment: Overall WFL for tasks assessed           Lower Extremity Assessment: Generalized weakness         Communication   Communication: No difficulties  Cognition Arousal/Alertness: Awake/alert Behavior During Therapy: Flat affect;Impulsive Overall Cognitive Status: Impaired/Different from baseline Area of Impairment: Problem solving;Memory;Attention;Safety/judgement   Current Attention Level: Sustained Memory: Decreased short-term memory   Safety/Judgement: Decreased awareness of deficits;Decreased awareness of safety   Problem Solving: Slow processing;Decreased initiation;Requires verbal cues;Requires tactile cues  General Comments      Exercises     Assessment/Plan    PT Assessment Patient needs continued PT services  PT Problem List Decreased strength;Decreased activity tolerance;Decreased balance;Decreased mobility;Decreased cognition;Decreased knowledge of use of DME;Decreased safety awareness;Decreased knowledge of  precautions;Obesity          PT Treatment Interventions DME instruction;Gait training;Functional mobility training;Therapeutic activities;Therapeutic exercise;Balance training;Cognitive remediation;Patient/family education    PT Goals (Current goals can be found in the Care Plan section)  Acute Rehab PT Goals Patient Stated Goal: Pt didn't state PT Goal Formulation: With patient Time For Goal Achievement: 09/18/16 Potential to Achieve Goals: Good    Frequency Min 3X/week   Barriers to discharge        Co-evaluation               End of Session Equipment Utilized During Treatment: Gait belt Activity Tolerance: Patient tolerated treatment well Patient left: in chair;with call bell/phone within reach;with chair alarm set Nurse Communication: Mobility status (nurse assisted with transfer)         Time: [id number] PT Time Calculation (min) (ACUTE ONLY): 22 min   Charges:   PT Evaluation $PT Eval Moderate Complexity: 1 Procedure     PT G CodesShary Decamp [place]ok 2016/10/02, 3:41 PM [employer] PT [phone]

## 2016-09-04 NOTE — Progress Notes (Signed)
PULMONARY / CRITICAL CARE MEDICINE   Name: Dean [name] MRN: [id number] DOB: April 20, 1951    ADMISSION DATE:  08/27/2016   REFERRING MD:  EDP  CHIEF COMPLAINT:   Post arrest   brief 65 yo MO (310 lbs 5'5") AAM who was walking back from the bathroom 12/3 when he collapsed pulseless. CPR wa not performed by family and EMS arrived in 10 minutes and performed CPR x 21 minutes, 7 rounds of epi, 5 shocks, amiodarone and magnesium. He coded again with ROSC. King airway was changed by EDP. He was taken to CT scanner with NAD of brain, transfer to CCU and cardiology was called. Hypothermia protocol was initiated at 1545 after ct head. His prognosis for meaningful recover is poor.  LINES/TUBES: 12/3 ET>>12/7 12/3 left I J CVL>>12/8   SIGNIFICANT EVENTS: 12/3 Cardiac arrest  12/5 > Re-Warmed, off sedation  12/7 extubated    12/8 Acutely confused overnight, pulled CVC, ativan ordered. Mittens in place   12/9 - wife and daughter at bedside - feel encephalopathy is rapidly improving. sTill sleepy but much better. They think he is ready to eat. STill per RN can slip out of bed and needs siter. No on infusion. opulse ox 97% 2L [place]. 94% on RA x 25min   SUBJECTIVE/OVERNIGHT/INTERVAL HX Remains on precedex, arousable and following some commands  VITAL SIGNS: BP 124/69 (BP Location: Right Leg)   Pulse 64   Temp 98.2 F (36.8 C) (Oral)   Resp 20   Ht 5\' 5"  (1.651 m)   Wt 117.3 kg (258 lb 9.6 oz)   SpO2 94%   BMI 43.03 kg/m   HEMODYNAMICS:    VENTILATOR SETTINGS:    INTAKE / OUTPUT: I/O last 3 completed shifts: In: 2205.9 [I.V.:2155.9; IV Piggyback:50] Out: 78 [Urine:460]  PHYSICAL EXAMINATION: General:  Adult male, easily arousable and following some commands, no acute distress  Neuro:  Moving all ext to commands and oriented only to self. HEENT:  [place]/AT, PERRL, EOM-I and MMM. Cardiovascular:  RRR, S1/S2, no MRG Lungs: Diffuse rhonchi throughout, unlabored, on nasal cannula    Abdomen: Obese soft  Musculoskeletal: no deformities  Skin: dry, intact   LABS: PULMONARY  Recent Labs Lab 08/29/16 0427  PHART 7.349*  PCO2ART 39.7  PO2ART 95.0  HCO3 21.9  TCO2 23  O2SAT 97.0   CBC  Recent Labs Lab 08/31/16 0427 09/01/16 0248 09/02/16 0541  HGB 10.5* 10.6* 12.1*  HCT 33.3* 34.0* 39.3  WBC 13.0* 9.5 12.1*  PLT 226 209 232   COAGULATION  Recent Labs Lab 08/29/16 0350  INR 1.65   CARDIAC  Recent Labs Lab 08/28/16 1011  TROPONINI 1.99*   No results for input(s): PROBNP in the last 168 hours.  CHEMISTRY  Recent Labs Lab 08/29/16 0350 08/30/16 0720 08/31/16 0427 09/01/16 0248 09/02/16 0541  NA 140 143 144 149* 148*  K 4.3 3.5 3.3* 3.6 3.5  CL 109 106 107 110 109  CO2 23 26 28 30 27   GLUCOSE 102* 106* 110* 105* 115*  BUN 12 11 16 17 16   CREATININE 1.17 1.14 1.27* 1.10 1.02  CALCIUM 8.9 9.4 9.5 9.9 10.3  MG 1.6* 1.8 1.8 2.2 2.2  PHOS 3.1 2.3* 2.7 3.8 2.9   Estimated Creatinine Clearance: 84.4 mL/min (by C-G formula based on SCr of 1.02 mg/dL).  LIVER  Recent Labs Lab 08/29/16 0350 09/01/16 1019  AST  --  68*  ALT  --  62  ALKPHOS  --  83  BILITOT  --  3.7*  PROT  --  7.5  ALBUMIN  --  3.3*  INR 1.65  --    INFECTIOUS  Recent Labs Lab 08/29/16 0350  LATICACIDVEN 1.4  PROCALCITON 1.26   ENDOCRINE CBG (last 3)   Recent Labs  09/04/16 0028 09/04/16 0539 09/04/16 0806  GLUCAP 111* 121* 118*   IMAGING x48h  - image(s) personally visualized  -   highlighted in bold Dg Chest Port 1 View  Result Date: 09/04/2016 CLINICAL DATA:  Chronic respiratory failure with hypoxia EXAM: PORTABLE CHEST 1 VIEW COMPARISON:  Chest x-ray of August 31, 2016 FINDINGS: The trachea and esophagus have been extubated. The left internal jugular venous catheter has been removed. The right lung is adequately inflated. On the left there is volume loss with obscuration of the hemidiaphragm and portions of the left heart border. The  cardiac silhouette remains enlarged. The pulmonary vascularity remains engorged. The trachea is midline. The bony thorax exhibits no acute abnormality. There is calcification in the wall of the aortic arch. IMPRESSION: Improved appearance of the right lung with decreased interstitial edema. Persistent left basilar atelectasis or pneumonia with small left pleural effusion. Stable cardiomegaly. Thoracic aortic atherosclerosis. Electronically Signed   By: David  Martinique M.D.   On: 09/04/2016 07:13   STUDIES:  Echo > EF 35-40% grade 2 DD  CULTURES: 12/3 bc>>negative   ANTIBIOTICS: DISCUSSION: 65 yo MO (310 lbs 5'5") AAM who was walking back from the bathroom 12/3 when he collapsed pulseless. CPR wa not performed by family and EMS arrived in 10 minutes and performed CPR x 21 minutes, 7 rounds of epi, 5 shocks, amiodarone and magnesium. He coded again with ROSC. King airway was changed by EDP. He was taken to CT scanner with NAD of brain, transfer to CCU and cardiology was called. Hypothermia protocol was initiated at 1545 after ct head.  ASSESSMENT / PLAN:  PULMONARY A: Post cardiac arrest 12/3 with 31 minutes down Hx of Asthma   - doing well on 3L [name] . Mild upper airwa noise P:   Supplemental oxygen for saturation >92. Follow CXR. Monitor for airway protection.  CARDIOVASCULAR A:  VT / VF arrest, ? 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  RENAL A:   ARI   + 5L since admit P:   Hold further lasix for now. Avoid nephrotoxins Monitor BMP and UOP with current support D5 @ 50 ml/hr   GASTROINTESTINAL A:   GI protection P:   PPI SLP today  HEMATOLOGIC A:   DVT prophylaxis P:  Heparin sq Trend CBC   INFECTIOUS A:   No overt  infection P:   Trend fever and WBC curve   ENDOCRINE A:   DM P:   SSI  NEUROLOGIC A:   Post Cardiac arrest CT head appears negative Agitation - delirium ? Vs anoxic injury   P:   RASS goal:-1 to 0 Neurology following  Avoid sedative medications  Avoid qtc prolongation - dc haldol prn - recheck ekg 09/04/16 E-Sitter at bedside - continue Continue precedex gtt but will attempt to decrease dose today as patient is more calm this AM  FAMILY  - Updates: No family bedside 12/11  - Inter-disciplinary family meet or Palliative Care meeting due  by: 09/03/16  The patient is critically ill with multiple organ systems failure and requires high complexity decision making for assessment and support, frequent evaluation and titration of therapies, application of advanced monitoring technologies and extensive interpretation of multiple databases.   Critical Care Time devoted to patient care services described in this note is  35  Minutes. This time reflects time of care of this signee Dr Jennet Maduro. This critical care time does not reflect procedure time, or teaching time or supervisory time of PA/NP/Med student/Med Resident etc but could involve care discussion time.  Rush Farmer, M.D. [facility] - Closed Pulmonary/Critical Care Medicine. Pager: [phone]. After hours pager: [phone].  09/04/2016 9:26 AM

## 2016-09-04 NOTE — Progress Notes (Signed)
Patient Name: Dean [name] Date of Encounter: 09/04/2016  Primary Cardiologist: Dr. [facility] Problem List     Principal Problem:   Cardiopulmonary arrest [facility]) Active Problems:   DM2 (diabetes mellitus, type 2) ([place])   Morbid obesity ([place])   Essential hypertension, benign   Acute encephalopathy   Acute respiratory failure ([place])     Subjective   Per the nurse, he is following commands better, but still on Precedex for anxiety.   Inpatient Medications    Scheduled Meds: . chlorhexidine  15 mL Mouth Rinse BID  . docusate  100 mg Per Tube Daily  . famotidine (PEPCID) IV  20 mg Intravenous Q12H  . furosemide  20 mg Intravenous Q12H  . heparin  5,000 Units Subcutaneous Q8H  . insulin aspart  0-15 Units Subcutaneous Q4H  . mouth rinse  15 mL Mouth Rinse q12n4p  . metoprolol  5 mg Intravenous Q6H  . nitroGLYCERIN  1 inch Topical Q6H  . potassium chloride  40 mEq Oral Once   Continuous Infusions: . sodium chloride Stopped (08/31/16 2300)  . dexmedetomidine 0.3 mcg/kg/hr (09/04/16 0700)  . dextrose 50 mL/hr at 09/04/16 0700   PRN Meds: sodium chloride, acetaminophen (TYLENOL) oral liquid 160 mg/5 mL, hydrALAZINE, sodium chloride flush   Vital Signs    Vitals:   09/04/16 0800 09/04/16 0808 09/04/16 0900 09/04/16 1000  BP: 124/69  132/74 107/61  Pulse: 64  60 (!) 56  Resp: 20  20 (!) 21  Temp:  98.2 F (36.8 C)    TempSrc:  Oral    SpO2: 94%  94% 91%  Weight:      Height:        Intake/Output Summary (Last 24 hours) at 09/04/16 1038 Last data filed at 09/04/16 1000  Gross per 24 hour  Intake             1516 ml  Output              760 ml  Net              756 ml   Filed Weights   08/27/16 1251 08/31/16 0230 09/04/16 0300  Weight: (!) 310 lb (140.6 kg) 264 lb 12.4 oz (120.1 kg) 258 lb 9.6 oz (117.3 kg)    Physical Exam    GEN: Resting comfortably.  HEENT: Grossly normal.  Neck:  no JVD,  Cardiac: RRR, no murmurs, rubs, or gallops. No  clubbing, cyanosis, edema.  Radials/DP/PT 2+ and equal bilaterally.  Respiratory:  Respirations regular and unlabored, clear to auscultation bilaterally. GI: Soft, nontender, nondistended, BS + x 4. MS: no deformity or atrophy. Skin: warm and dry, no rash. Neuro:  Strength and sensation are intact. Psych: AAOx3.  Normal affect.  Labs    CBC  Recent Labs  09/02/16 0541  WBC 12.1*  HGB 12.1*  HCT 39.3  MCV 86.2  PLT [id number]   Basic Metabolic Panel  Recent Labs  09/02/16 0541  NA 148*  K 3.5  CL 109  CO2 27  GLUCOSE 115*  BUN 16  CREATININE 1.02  CALCIUM 10.3  MG 2.2  PHOS 2.9   Liver Function Tests No results for input(s): AST, ALT, ALKPHOS, BILITOT, PROT, ALBUMIN in the last 72 hours. No results for input(s): LIPASE, AMYLASE in the last 72 hours. Cardiac Enzymes No results for input(s): CKTOTAL, CKMB, CKMBINDEX, TROPONINI in the last 72 hours. BNP Invalid input(s): POCBNP D-Dimer No results for input(s): DDIMER in the last  72 hours. Hemoglobin A1C No results for input(s): HGBA1C in the last 72 hours. Fasting Lipid Panel No results for input(s): CHOL, HDL, LDLCALC, TRIG, CHOLHDL, LDLDIRECT in the last 72 hours. Thyroid Function Tests No results for input(s): TSH, T4TOTAL, T3FREE, THYROIDAB in the last 72 hours.  Invalid input(s): FREET3  Telemetry    NSR, PVCs, couplets - Personally Reviewed  ECG    NSR, IRBBB, NSST  Radiology    Dg Chest Port 1 View  Result Date: 09/04/2016 CLINICAL DATA:  Chronic respiratory failure with hypoxia EXAM: PORTABLE CHEST 1 VIEW COMPARISON:  Chest x-ray of August 31, 2016 FINDINGS: The trachea and esophagus have been extubated. The left internal jugular venous catheter has been removed. The right lung is adequately inflated. On the left there is volume loss with obscuration of the hemidiaphragm and portions of the left heart border. The cardiac silhouette remains enlarged. The pulmonary vascularity remains engorged. The  trachea is midline. The bony thorax exhibits no acute abnormality. There is calcification in the wall of the aortic arch. IMPRESSION: Improved appearance of the right lung with decreased interstitial edema. Persistent left basilar atelectasis or pneumonia with small left pleural effusion. Stable cardiomegaly. Thoracic aortic atherosclerosis. Electronically Signed   By: David  Martinique M.D.   On: 09/04/2016 07:13    Cardiac Studies   EF 35-40%  Patient Profile     65 y/o with cardiac arrest  Assessment & Plan    1) Cardiac arrest.  Will need to discuss mental status and neuro recovery with PCCM.  The plan was for cardiac cath when he can fully follow commands required for the procedure.   EF decreased.  Does not appear to be in heart failure. Will need ACE-I and beta blocker as BP and renal function allow.   HTN: BP controlled.   Signed, Larae Grooms, MD  09/04/2016, 10:38 AM

## 2016-09-04 NOTE — Progress Notes (Signed)
Speech Language Pathology Treatment: Dysphagia  Patient Details Name: Dean [name] MRN: [id number] DOB: [birth year] Today's Date: 09/04/2016 Time: [id number] SLP Time Calculation (min) (ACUTE ONLY): 8 min  Assessment / Plan / Recommendation Clinical Impression  Patient more alert today. Po trials provided. Thin liquids continue to elicit an immediate cough response indicative of aspiration. Pureed solids however consumed without overt s/s of aspiration. Patient with multiple risk factors for aspiration, particularly silent aspiration,  including dysphonia indicative of decreased glottal closure, 4 day intubation, and AMS warranting instrumental testing. Will proceed with FEES 12/12.    HPI HPI: Pt is a 65 y.o. male with PMH of asthma, DM, gout, HTN who callapsed pulseless on 12/3 requiring CPR x 21 minutes, 7 rounds of epi, 5 shocks, amiodarone and magnesium, was intubated. EEG 12/4 was markedly abnormal wiht severe diffuse cerebral dysfunction. Pt was extubated 12/7. Pt reportedly doing better on this date, family wanting him to eat. Bedside swallow eval ordered. CXR 12/7 showed increasing bibasilar atelectasis and small left effusion.      SLP Plan  Other (Comment) (FEES)     Recommendations  Diet recommendations: NPO                Oral Care Recommendations: Oral care QID;Oral care prior to ice chip/H20 Follow up Recommendations:  (TBD) Plan: Other (Comment) (FEES)       [place], CCC-SLP [phone]       Gabriel Rainwater Meryl 09/04/2016, 3:47 PM

## 2016-09-05 ENCOUNTER — Encounter (HOSPITAL_COMMUNITY): Payer: Self-pay | Admitting: General Practice

## 2016-09-05 DIAGNOSIS — D62 Acute posthemorrhagic anemia: Secondary | ICD-10-CM

## 2016-09-05 DIAGNOSIS — R0902 Hypoxemia: Secondary | ICD-10-CM

## 2016-09-05 DIAGNOSIS — E1165 Type 2 diabetes mellitus with hyperglycemia: Secondary | ICD-10-CM

## 2016-09-05 DIAGNOSIS — E876 Hypokalemia: Secondary | ICD-10-CM

## 2016-09-05 DIAGNOSIS — E669 Obesity, unspecified: Secondary | ICD-10-CM

## 2016-09-05 DIAGNOSIS — E1169 Type 2 diabetes mellitus with other specified complication: Secondary | ICD-10-CM

## 2016-09-05 DIAGNOSIS — I5043 Acute on chronic combined systolic (congestive) and diastolic (congestive) heart failure: Secondary | ICD-10-CM

## 2016-09-05 DIAGNOSIS — E870 Hyperosmolality and hypernatremia: Secondary | ICD-10-CM

## 2016-09-05 DIAGNOSIS — R0682 Tachypnea, not elsewhere classified: Secondary | ICD-10-CM

## 2016-09-05 DIAGNOSIS — J45909 Unspecified asthma, uncomplicated: Secondary | ICD-10-CM

## 2016-09-05 DIAGNOSIS — D72829 Elevated white blood cell count, unspecified: Secondary | ICD-10-CM

## 2016-09-05 LAB — GLUCOSE, CAPILLARY
Glucose-Capillary: 104 mg/dL — ABNORMAL HIGH (ref 65–99)
Glucose-Capillary: 104 mg/dL — ABNORMAL HIGH (ref 65–99)
Glucose-Capillary: 105 mg/dL — ABNORMAL HIGH (ref 65–99)
Glucose-Capillary: 106 mg/dL — ABNORMAL HIGH (ref 65–99)
Glucose-Capillary: 122 mg/dL — ABNORMAL HIGH (ref 65–99)
Glucose-Capillary: 90 mg/dL (ref 65–99)
Glucose-Capillary: 97 mg/dL (ref 65–99)

## 2016-09-05 LAB — BASIC METABOLIC PANEL
Anion gap: 11 (ref 5–15)
BUN: 21 mg/dL — ABNORMAL HIGH (ref 6–20)
CO2: 27 mmol/L (ref 22–32)
Calcium: 10 mg/dL (ref 8.9–10.3)
Chloride: 106 mmol/L (ref 101–111)
Creatinine, Ser: 1.14 mg/dL (ref 0.61–1.24)
GFR calc Af Amer: >60 mL/min (ref >60)
GFR calc non Af Amer: >60 mL/min (ref >60)
Glucose, Bld: 103 mg/dL — ABNORMAL HIGH (ref 65–99)
Potassium: 3.2 mmol/L — ABNORMAL LOW (ref 3.5–5.1)
Sodium: 144 mmol/L (ref 135–145)

## 2016-09-05 LAB — CBC
HCT: 39.4 % (ref 39.0–52.0)
Hemoglobin: 12.3 g/dL — ABNORMAL LOW (ref 13.0–17.0)
MCH: 26.6 pg (ref 26.0–34.0)
MCHC: 31.2 g/dL (ref 30.0–36.0)
MCV: 85.1 fL (ref 78.0–100.0)
Platelets: 243 10*3/uL (ref 150–400)
RBC: 4.63 MIL/uL (ref 4.22–5.81)
RDW: 14 % (ref 11.5–15.5)
WBC: 11.3 10*3/uL — ABNORMAL HIGH (ref 4.0–10.5)

## 2016-09-05 LAB — PHOSPHORUS: Phosphorus: 2.7 mg/dL (ref 2.5–4.6)

## 2016-09-05 LAB — MAGNESIUM: Magnesium: 2.3 mg/dL (ref 1.7–2.4)

## 2016-09-05 MED ORDER — RESOURCE THICKENUP CLEAR PO POWD
ORAL | Status: DC | PRN
  Administered 2016-09-05: 16:00:00 via ORAL
  Filled 2016-09-05 (×2): qty 125

## 2016-09-05 MED ORDER — SODIUM CHLORIDE 0.9 % IV SOLN
30.0000 meq | Freq: Once | INTRAVENOUS | Status: AC
  Administered 2016-09-05: 30 meq via INTRAVENOUS
  Filled 2016-09-05: qty 15

## 2016-09-05 NOTE — Progress Notes (Signed)
Patient had a 26 and 20 beat run of Vtach. EKG completed. Pt with no complaints. Cardiology notified and aware. SBP 180's. Scheduled IV metoprolol given.

## 2016-09-05 NOTE — Progress Notes (Signed)
Patient Name: Dean [name] Date of Encounter: 09/05/2016  Primary Cardiologist: Dr. [facility] Problem List     Principal Problem:   Cardiopulmonary arrest [facility]) Active Problems:   DM2 (diabetes mellitus, type 2) ([place])   Morbid obesity ([place])   Essential hypertension, benign   Acute encephalopathy   Acute respiratory failure ([place])     Subjective   Answering questions.  No chest pain.  Feels ok.  Has swallow study scheduled.  Inpatient Medications    Scheduled Meds: . chlorhexidine  15 mL Mouth Rinse BID  . docusate  100 mg Per Tube Daily  . famotidine (PEPCID) IV  20 mg Intravenous Q12H  . furosemide  20 mg Intravenous Q12H  . heparin  5,000 Units Subcutaneous Q8H  . insulin aspart  0-15 Units Subcutaneous Q4H  . mouth rinse  15 mL Mouth Rinse q12n4p  . metoprolol  5 mg Intravenous Q6H  . nitroGLYCERIN  1 inch Topical Q6H  . potassium chloride  40 mEq Oral Once   Continuous Infusions: . sodium chloride Stopped (08/31/16 2300)  . dexmedetomidine Stopped (09/05/16 0730)  . dextrose 20 mL/hr at 09/05/16 0833   PRN Meds: sodium chloride, acetaminophen (TYLENOL) oral liquid 160 mg/5 mL, hydrALAZINE, sodium chloride flush   Vital Signs    Vitals:   09/05/16 0700 09/05/16 0800 09/05/16 0808 09/05/16 0900  BP: (!) 158/89 (!) 152/85  138/74  Pulse: 70 71  74  Resp: (!) 23 20  16   Temp:   98.8 F (37.1 C)   TempSrc:   Oral   SpO2: 97% 97%  95%  Weight:      Height:        Intake/Output Summary (Last 24 hours) at 09/05/16 1004 Last data filed at 09/05/16 0604  Gross per 24 hour  Intake          1435.55 ml  Output             1200 ml  Net           235.55 ml   Filed Weights   08/27/16 1251 08/31/16 0230 09/04/16 0300  Weight: (!) 310 lb (140.6 kg) 264 lb 12.4 oz (120.1 kg) 258 lb 9.6 oz (117.3 kg)    Physical Exam    GEN: Resting comfortably.  HEENT: Grossly normal.  Neck:  no JVD,  Cardiac: RRR, no murmurs, rubs, or gallops. No clubbing,  cyanosis, edema.  Radials/DP/PT 2+ and equal bilaterally.  Respiratory:  Respirations regular and unlabored, clear to auscultation bilaterally. GI: Soft, nontender, nondistended, BS + x 4. MS: no deformity or atrophy. Skin: warm and dry, no rash. Neuro:  Strength and sensation are intact. Psych: Answering questions appropriately  Labs    CBC  Recent Labs  09/05/16 0255  WBC 11.3*  HGB 12.3*  HCT 39.4  MCV 85.1  PLT [id number]   Basic Metabolic Panel  Recent Labs  09/05/16 0255  NA 144  K 3.2*  CL 106  CO2 27  GLUCOSE 103*  BUN 21*  CREATININE 1.14  CALCIUM 10.0  MG 2.3  PHOS 2.7   Liver Function Tests No results for input(s): AST, ALT, ALKPHOS, BILITOT, PROT, ALBUMIN in the last 72 hours. No results for input(s): LIPASE, AMYLASE in the last 72 hours. Cardiac Enzymes No results for input(s): CKTOTAL, CKMB, CKMBINDEX, TROPONINI in the last 72 hours. BNP Invalid input(s): POCBNP D-Dimer No results for input(s): DDIMER in the last 72 hours. Hemoglobin A1C No results for input(s): HGBA1C  in the last 72 hours. Fasting Lipid Panel No results for input(s): CHOL, HDL, LDLCALC, TRIG, CHOLHDL, LDLDIRECT in the last 72 hours. Thyroid Function Tests No results for input(s): TSH, T4TOTAL, T3FREE, THYROIDAB in the last 72 hours.  Invalid input(s): FREET3  Telemetry    NSR, PVCs, couplets - Personally Reviewed  ECG    NSR, IRBBB, NSST  Radiology    Dg Chest Port 1 View  Result Date: 09/04/2016 CLINICAL DATA:  Chronic respiratory failure with hypoxia EXAM: PORTABLE CHEST 1 VIEW COMPARISON:  Chest x-ray of August 31, 2016 FINDINGS: The trachea and esophagus have been extubated. The left internal jugular venous catheter has been removed. The right lung is adequately inflated. On the left there is volume loss with obscuration of the hemidiaphragm and portions of the left heart border. The cardiac silhouette remains enlarged. The pulmonary vascularity remains engorged. The  trachea is midline. The bony thorax exhibits no acute abnormality. There is calcification in the wall of the aortic arch. IMPRESSION: Improved appearance of the right lung with decreased interstitial edema. Persistent left basilar atelectasis or pneumonia with small left pleural effusion. Stable cardiomegaly. Thoracic aortic atherosclerosis. Electronically Signed   By: David  Martinique M.D.   On: 09/04/2016 07:13    Cardiac Studies   EF 35-40%  Patient Profile     65 y/o with cardiac arrest  Assessment & Plan    1) Cardiac arrest.  Mental status improved.  The plan was for cardiac cath when he can fully follow commands required for the procedure. Swallow study today.  Will see how he progresses.  He may be ready for cath in the next few days.  EF decreased.  Does not appear to be in heart failure. Will need ACE-I and beta blocker as BP and renal function allow.   HTN: BP controlled.   Signed, Larae Grooms, MD  09/05/2016, 10:04 AM

## 2016-09-05 NOTE — Progress Notes (Signed)
Rehab admissions - OT currently with patient; awaiting their evaluation and recommendations.  Noted recent move from 4N to 3E02.  I will meet with patient later and contact family once I see OT evaluation.  Call me for questions.  [id number]

## 2016-09-05 NOTE — Progress Notes (Signed)
Physical Therapy Treatment Patient Details Name: Dean [name] MRN: [id number] DOB: 06-09-1951 Today's Date: 09/05/2016    History of Present Illness Pt is a 65 y.o. male with PMH of asthma, DM, gout, HTN who callapsed pulseless on 12/3. No CPR x 10 minutes then requiring CPR x 21 minutes, 7 rounds of epi, 5 shocks, amiodarone and magnesium, was intubated. EEG 12/4 was markedly abnormal wiht severe diffuse cerebral dysfunction. Pt was extubated 12/7,  PMH - asthma, DM, HTN, gout    PT Comments    Pt making good progress. Continues to have significant cognitive deficits as well as mobility deficits.  Follow Up Recommendations  CIR     Equipment Recommendations  Rolling walker with 5" wheels    Recommendations for Other Services OT consult     Precautions / Restrictions Precautions Precautions: Fall Restrictions Weight Bearing Restrictions: No    Mobility  Bed Mobility               General bed mobility comments: Pt up in chair  Transfers Overall transfer level: Needs assistance Equipment used: Rolling walker (2 wheeled) Transfers: Sit to/from Stand Sit to Stand: Min assist         General transfer comment: Verbal cues for hand placement. Assist to bring hips up and for balance.  Ambulation/Gait Ambulation/Gait assistance: Min assist Ambulation Distance (Feet): 120 Feet Assistive device: Rolling walker (2 wheeled) Gait Pattern/deviations: Step-through pattern;Decreased step length - right;Decreased step length - left;Drifts right/left;Trunk flexed;Wide base of support Gait velocity: decr Gait velocity interpretation: Below normal speed for age/gender General Gait Details: Verbal cues to stand more erect and stay closer to the walker especially when turning. Assist for balance and support.   Stairs            Wheelchair Mobility    Modified Rankin (Stroke Patients Only)       Balance Overall balance assessment: Needs  assistance Sitting-balance support: Bilateral upper extremity supported;Feet supported Sitting balance-Leahy Scale: Poor Sitting balance - Comments: UE support and min guard for safety   Standing balance support: Bilateral upper extremity supported Standing balance-Leahy Scale: Poor Standing balance comment: walker and min guard assist for static standing                    Cognition Arousal/Alertness: Awake/alert Behavior During Therapy: Flat affect;Impulsive Overall Cognitive Status: Impaired/Different from baseline Area of Impairment: Problem solving;Memory;Attention;Safety/judgement;Orientation Orientation Level: Disoriented to;Time;Situation Current Attention Level: Sustained Memory: Decreased short-term memory;Decreased recall of precautions   Safety/Judgement: Decreased awareness of deficits;Decreased awareness of safety   Problem Solving: Slow processing;Decreased initiation;Requires verbal cues;Requires tactile cues      Exercises      General Comments        Pertinent Vitals/Pain      Home Living                      Prior Function            PT Goals (current goals can now be found in the care plan section) Progress towards PT goals: Progressing toward goals    Frequency    Min 3X/week      PT Plan Current plan remains appropriate    Co-evaluation             End of Session Equipment Utilized During Treatment: Gait belt Activity Tolerance: Patient tolerated treatment well Patient left: in chair;with call bell/phone within reach;with chair alarm set     Time: [id number] PT  Time Calculation (min) (ACUTE ONLY): 18 min  Charges:  $Gait Training: 8-22 mins                    G Codes:      Shary Decamp [place] 09-27-2016, 2:25 PM [employer] PT [phone]

## 2016-09-05 NOTE — Procedures (Signed)
Objective Swallowing Evaluation: Type of Study: FEES-Fiberoptic Endoscopic Evaluation of Swallow  Patient Details  Name: Dean [name] MRN: [id number] Date of Birth: [birth year]  Today's Date: 09/05/2016 Time: SLP Start Time (ACUTE ONLY): 1342-SLP Stop Time (ACUTE ONLY): 1357 SLP Time Calculation (min) (ACUTE ONLY): 15 min  Past Medical History:  Past Medical History:  Diagnosis Date  . Asthma   . Diabetes mellitus without complication ([place])   . Gout   . Hypertension    Past Surgical History: No past surgical history on file. HPI: Pt is a 65 y.o. male with PMH of asthma, DM, gout, HTN who callapsed pulseless on 12/3 requiring CPR x 21 minutes, 7 rounds of epi, 5 shocks, amiodarone and magnesium, was intubated. EEG 12/4 was markedly abnormal wiht severe diffuse cerebral dysfunction. Pt was extubated 12/7. Pt reportedly doing better on this date, family wanting him to eat. Bedside swallow eval ordered. CXR 12/7 showed increasing bibasilar atelectasis and small left effusion.  Subjective: pt alert, pleasant, eager for POs   Assessment / Plan / Recommendation  CHL IP CLINICAL IMPRESSIONS 09/05/2016  Therapy Diagnosis Mild pharyngeal phase dysphagia  Clinical Impression Pt has a mild pharyngeal dysphagia that is likely acute and reversible s/p intubation. He has bilateral lesions and bowing of the posterior third of his true vocal folds, likely indicative of where the ETT was located. He has a delay in swallow initiation and immediate coughing following most sips of thin liquids concerning for aspiration at the height of the swallow as it was not visualized on study. Additional trials of thin liquids showed deep, silent penetration to the vocal folds that could be mostly cleared with cued coughs. Similar penetration occurs with larger consecutive boluses of nectar thick liquids by straw. He is better able to protect his airway with solids and cup sips of nectar thick liquids, with only mild  residuals remaining in the valleculae after the swallow. Recommend Dys 3 diet and nectar thick liquids by cup. SLP to f/u for tolerance and likely advancement with increased time post-extubation.  Impact on safety and function Mild aspiration risk;Moderate aspiration risk      CHL IP TREATMENT RECOMMENDATION 09/05/2016  Treatment Recommendations Therapy as outlined in treatment plan below     Prognosis 09/05/2016  Prognosis for Safe Diet Advancement Good  Barriers to Reach Goals Cognitive deficits  Barriers/Prognosis Comment --    CHL IP DIET RECOMMENDATION 09/05/2016  SLP Diet Recommendations Dysphagia 3 (Mech soft) solids;Nectar thick liquid  Liquid Administration via Cup;No straw  Medication Administration Whole meds with puree  Compensations Slow rate;Small sips/bites  Postural Changes Seated upright at 90 degrees      CHL IP OTHER RECOMMENDATIONS 09/05/2016  Recommended Consults --  Oral Care Recommendations Oral care BID  Other Recommendations Order thickener from pharmacy;Prohibited food (jello, ice cream, thin soups);Remove water pitcher      CHL IP FOLLOW UP RECOMMENDATIONS 09/05/2016  Follow up Recommendations Inpatient Rehab      CHL IP FREQUENCY AND DURATION 09/05/2016  Speech Therapy Frequency (ACUTE ONLY) min 2x/week  Treatment Duration 2 weeks           CHL IP ORAL PHASE 09/05/2016  Oral Phase WFL  Oral - Pudding Teaspoon --  Oral - Pudding Cup --  Oral - Honey Teaspoon --  Oral - Honey Cup --  Oral - Nectar Teaspoon --  Oral - Nectar Cup --  Oral - Nectar Straw --  Oral - Thin Teaspoon --  Oral - Thin Cup --  Oral - Thin Straw --  Oral - Puree --  Oral - Mech Soft --  Oral - Regular --  Oral - Multi-Consistency --  Oral - Pill --  Oral Phase - Comment --    CHL IP PHARYNGEAL PHASE 09/05/2016  Pharyngeal Phase Impaired  Pharyngeal- Pudding Teaspoon --  Pharyngeal --  Pharyngeal- Pudding Cup --  Pharyngeal --  Pharyngeal- Honey Teaspoon --   Pharyngeal --  Pharyngeal- Honey Cup --  Pharyngeal --  Pharyngeal- Nectar Teaspoon --  Pharyngeal --  Pharyngeal- Nectar Cup Delayed swallow initiation-vallecula;Reduced airway/laryngeal closure  Pharyngeal --  Pharyngeal- Nectar Straw Delayed swallow initiation-pyriform sinuses;Reduced airway/laryngeal closure;Penetration/Aspiration during swallow  Pharyngeal Material enters airway, CONTACTS cords and not ejected out  Pharyngeal- Thin Teaspoon --  Pharyngeal --  Pharyngeal- Thin Cup Delayed swallow initiation-pyriform sinuses;Penetration/Aspiration during swallow;Reduced airway/laryngeal closure  Pharyngeal Material enters airway, passes BELOW cords and not ejected out despite cough attempt by patient  Pharyngeal- Thin Straw --  Pharyngeal --  Pharyngeal- Puree Delayed swallow initiation-vallecula  Pharyngeal --  Pharyngeal- Mechanical Soft Delayed swallow initiation-vallecula;Pharyngeal residue - valleculae  Pharyngeal --  Pharyngeal- Regular --  Pharyngeal --  Pharyngeal- Multi-consistency --  Pharyngeal --  Pharyngeal- Pill --  Pharyngeal --  Pharyngeal Comment --     CHL IP CERVICAL ESOPHAGEAL PHASE 09/05/2016  Cervical Esophageal Phase WFL  Pudding Teaspoon --  Pudding Cup --  Honey Teaspoon --  Honey Cup --  Nectar Teaspoon --  Nectar Cup --  Nectar Straw --  Thin Teaspoon --  Thin Cup --  Thin Straw --  Puree --  Mechanical Soft --  Regular --  Multi-consistency --  Pill --  Cervical Esophageal Comment --    No flowsheet data found.  Germain Osgood 09/05/2016, 3:47 PM    Germain Osgood, M.A. CCC-SLP [phone]

## 2016-09-05 NOTE — Consult Note (Signed)
Physical Medicine and Rehabilitation Consult Reason for Consult: Debilitation/cardiopulmonary arrest/anoxic encephalopathy Referring Physician: Critical care  HPI: Dean [name] is a 65 y.o. right handed male with history of asthma, diabetes mellitus, gout and hypertension. Per chart review patient lives with spouse independent prior to admission. One level home. Presented 08/27/2016 after he collapsed walking back from the bathroom. CPR not performed by family and EMS arrived 10 minutes later, performed CPR 21 minutes with 7 rounds of epi, 5 shocks, amiodarone, and magnesium given. He coded again in route to the hospital. Cranial CT scan negative. Troponin 1.54-2.61. Creatinine 1.31. WBC 22,100. EKG showed sinus rhythm with wide QRS/LBBB possible old anterior MI. Chest x-ray showed resolving interstitial pulmonary edema. He did not meet criteria for STEMI or urgent cardiac cath. Echocardiogram with ejection fraction of [id number] grade 2 diastolic dysfunction. Akinesis of the basal inferior and apical myocardium. There was concerns of possible seizure with neurology consulted. EEG with diffuse background suppression but no obvious seizure noted. Patient remained intubated until 08/31/2016. Cardiology service is following and plan is for cardiac catheterization for further evaluation when patient can follow full commands and tolerate procedure. Maintained on subcutaneous heparin for DVT prophylaxis. Physical therapy evaluation completed 09/04/2016 and recommendations of physical medicine rehabilitation consult.  Review of Systems  Constitutional: Negative for chills and fever.  Respiratory: Negative for shortness of breath.   Cardiovascular: Negative for chest pain.  Genitourinary:       Retention  Musculoskeletal: Positive for myalgias.  Neurological: Positive for weakness.  All other systems reviewed and are negative.  Past Medical History:  Diagnosis Date  . Asthma   . Diabetes mellitus  without complication ([place])   . Gout   . Hypertension    No past surgical history per pt. No pertinent family history of premature cardiac arrest per pt. Social History:  reports that he has never smoked. He does not have any smokeless tobacco history on file. He reports that he does not drink alcohol or use drugs. Allergies: No Known Allergies Medications Prior to Admission  Medication Sig Dispense Refill  . allopurinol (ZYLOPRIM) 300 MG tablet Take 300 mg by mouth daily.    [name] amLODipine (NORVASC) 10 MG tablet Take 10 mg by mouth daily.    . cloNIDine (CATAPRES) 0.2 MG tablet Take 0.2 mg by mouth daily.    . [employer]S VITAMIN D3 1000 units capsule Take 1,000 Units by mouth daily.  5  . furosemide (LASIX) 40 MG tablet Take 40 mg by mouth daily.    . Ipratropium-Albuterol (COMBIVENT RESPIMAT) 20-100 MCG/ACT AERS respimat Inhale 1 puff into the lungs every 6 (six) hours as needed for wheezing or shortness of breath.    . isosorbide-hydrALAZINE (BIDIL) 20-37.5 MG tablet Take 1 tablet by mouth 3 (three) times daily.    . metFORMIN (GLUCOPHAGE) 500 MG tablet Take 500 mg by mouth 2 (two) times daily with a meal.     . metoprolol (TOPROL-XL) 200 MG 24 hr tablet Take 300 mg by mouth daily.    [name] PRESCRIPTION MEDICATION Inhale into the lungs at bedtime. CPAP    . rosuvastatin (CRESTOR) 20 MG tablet Take 20 mg by mouth daily.    [name] spironolactone (ALDACTONE) 50 MG tablet Take 50 mg by mouth daily.      Home: Home Living Family/patient expects to be discharged to:: Private residence Living Arrangements: Spouse/significant other Available Help at Discharge: Family Home Access: Level entry [place]: One [place]: None Additional  Comments: Information from pt. Unable to confirm with family if this is accurate.  Functional History: Prior Function Level of Independence: Independent Functional Status:  Mobility: Bed Mobility Overal bed mobility: Needs Assistance Bed Mobility: Supine to  Sit Supine to sit: Mod assist, +2 for safety/equipment General bed mobility comments: Assist to bring legs off bed and to elevate trunk into sitting Transfers Overall transfer level: Needs assistance Equipment used: Rolling walker (2 wheeled) Transfers: Sit to/from Stand, Stand Pivot Transfers Sit to Stand: +2 physical assistance, Mod assist Stand pivot transfers: +2 physical assistance, Min assist General transfer comment: Assist to bring hips up and for balance to stand. Assist for balance with pivotal steps to chair with walker.      ADL:    Cognition: Cognition Overall Cognitive Status: Impaired/Different from baseline Orientation Level: Oriented to person, Oriented to place, Disoriented to situation, Disoriented to time Cognition Arousal/Alertness: Awake/alert Behavior During Therapy: Flat affect, Impulsive Overall Cognitive Status: Impaired/Different from baseline Area of Impairment: Problem solving, Memory, Attention, Safety/judgement Current Attention Level: Sustained Memory: Decreased short-term memory Safety/Judgement: Decreased awareness of deficits, Decreased awareness of safety Problem Solving: Slow processing, Decreased initiation, Requires verbal cues, Requires tactile cues  Blood pressure 138/74, pulse 74, temperature 98.8 F (37.1 C), temperature source Oral, resp. rate 16, height 5\' 5"  (1.651 m), weight 117.3 kg (258 lb 9.6 oz), SpO2 95 %. Physical Exam  Vitals reviewed. Constitutional: He appears well-developed.  Obese  HENT:  Head: Normocephalic and atraumatic.  Eyes: Conjunctivae and EOM are normal.  Neck: Normal range of motion. Neck supple. No thyromegaly present.  Cardiovascular: Normal rate and regular rhythm.   Respiratory: Effort normal and breath sounds normal. No respiratory distress.  GI: Soft. Bowel sounds are normal. He exhibits no distension. There is no tenderness.  Musculoskeletal: He exhibits no edema or tenderness.  Neurological: He is  alert.  Makes good eye contact with examiner.  He did follow simple commands.  Very limited awareness of deficits. Alert and oriented x2 Motor: 4+/5 throughout (right stronger than left) Sensation intact to light touch  Skin: Skin is warm and dry.  Psychiatric: His affect is blunt. He is slowed.    Results for orders placed or performed during the hospital encounter of 08/27/16 (from the past 24 hour(s))  Glucose, capillary     Status: Abnormal   Collection Time: 09/04/16 12:10 PM  Result Value Ref Range   Glucose-Capillary 108 (H) 65 - 99 mg/dL  Glucose, capillary     Status: None   Collection Time: 09/04/16  4:19 PM  Result Value Ref Range   Glucose-Capillary 79 65 - 99 mg/dL  Glucose, capillary     Status: Abnormal   Collection Time: 09/04/16  8:43 PM  Result Value Ref Range   Glucose-Capillary 108 (H) 65 - 99 mg/dL  Glucose, capillary     Status: None   Collection Time: 09/05/16 12:34 AM  Result Value Ref Range   Glucose-Capillary 90 65 - 99 mg/dL  CBC     Status: Abnormal   Collection Time: 09/05/16  2:55 AM  Result Value Ref Range   WBC 11.3 (H) 4.0 - 10.5 K/uL   RBC 4.63 4.22 - 5.81 MIL/uL   Hemoglobin 12.3 (L) 13.0 - 17.0 g/dL   HCT 39.4 39.0 - 52.0 %   MCV 85.1 78.0 - 100.0 fL   MCH 26.6 26.0 - 34.0 pg   MCHC 31.2 30.0 - 36.0 g/dL   RDW 14.0 11.5 - 15.5 %  Platelets 243 150 - 400 K/uL  Basic metabolic panel     Status: Abnormal   Collection Time: 09/05/16  2:55 AM  Result Value Ref Range   Sodium 144 135 - 145 mmol/L   Potassium 3.2 (L) 3.5 - 5.1 mmol/L   Chloride 106 101 - 111 mmol/L   CO2 27 22 - 32 mmol/L   Glucose, Bld 103 (H) 65 - 99 mg/dL   BUN 21 (H) 6 - 20 mg/dL   Creatinine, Ser 1.14 0.61 - 1.24 mg/dL   Calcium 10.0 8.9 - 10.3 mg/dL   GFR calc non Af Amer >60 >60 mL/min   GFR calc Af Amer >60 >60 mL/min   Anion gap 11 5 - 15  Magnesium     Status: None   Collection Time: 09/05/16  2:55 AM  Result Value Ref Range   Magnesium 2.3 1.7 - 2.4  mg/dL  Phosphorus     Status: None   Collection Time: 09/05/16  2:55 AM  Result Value Ref Range   Phosphorus 2.7 2.5 - 4.6 mg/dL  Glucose, capillary     Status: Abnormal   Collection Time: 09/05/16  4:16 AM  Result Value Ref Range   Glucose-Capillary 104 (H) 65 - 99 mg/dL  Glucose, capillary     Status: None   Collection Time: 09/05/16  8:07 AM  Result Value Ref Range   Glucose-Capillary 97 65 - 99 mg/dL   Dg Chest Port 1 View  Result Date: 09/04/2016 CLINICAL DATA:  Chronic respiratory failure with hypoxia EXAM: PORTABLE CHEST 1 VIEW COMPARISON:  Chest x-ray of August 31, 2016 FINDINGS: The trachea and esophagus have been extubated. The left internal jugular venous catheter has been removed. The right lung is adequately inflated. On the left there is volume loss with obscuration of the hemidiaphragm and portions of the left heart border. The cardiac silhouette remains enlarged. The pulmonary vascularity remains engorged. The trachea is midline. The bony thorax exhibits no acute abnormality. There is calcification in the wall of the aortic arch. IMPRESSION: Improved appearance of the right lung with decreased interstitial edema. Persistent left basilar atelectasis or pneumonia with small left pleural effusion. Stable cardiomegaly. Thoracic aortic atherosclerosis. Electronically Signed   By: David  Martinique M.D.   On: 09/04/2016 07:13    Assessment/Plan: Diagnosis: Debilitation/cardiopulmonary arrest/anoxic encephalopathy Labs and images independently reviewed.  Records reviewed and summated above.  1. Does the need for close, 24 hr/day medical supervision in concert with the patient's rehab needs make it unreasonable for this patient to be served in a less intensive setting? Yes  2. Co-Morbidities requiring supervision/potential complications: asthma (meds as necessary), diabetes mellitus (Monitor in accordance with exercise and adjust meds as necessary), gout (monitor for flares), combined  CHF (Monitor in accordance with increased physical activity and avoid UE resistance excercises), tachypnea (monitor RR and O2 Sats with increased physical exertion), HTN (monitor and provide prns in accordance with increased physical exertion and pain), hypernatremia (cont to monitor, treat as necessary), hypokalemia (continue to monitor and replete as necessary), leukocytosis (cont to monitor for signs and symptoms of infection, further workup if indicated), ABLA (transfuse if necessary to ensure appropriate perfusion for increased activity tolerance), morbid obesity (Body mass index is 43.03 kg/m.,  Diet and exercise education, encourage weight loss to increase endurance and promote overall health) 3. Due to bladder management, safety, disease management and patient education, does the patient require 24 hr/day rehab nursing? Yes 4. Does the patient require coordinated care of a  physician, rehab nurse, PT (1-2 hrs/day, 5 days/week), OT (1-2 hrs/day, 51 days/week) and SLP (1-2 hrs/day, 5 days/week) to address physical and functional deficits in the context of the above medical diagnosis(es)? Yes Addressing deficits in the following areas: balance, endurance, locomotion, strength, transferring, bowel/bladder control, bathing, dressing, toileting, cognition and psychosocial support 5. Can the patient actively participate in an intensive therapy program of at least 3 hrs of therapy per day at least 5 days per week? Potentially 6. The potential for patient to make measurable gains while on inpatient rehab is excellent 7. Anticipated functional outcomes upon discharge from inpatient rehab are modified independent and supervision  with PT, modified independent and supervision with OT, modified independent and supervision with SLP. 8. Estimated rehab length of stay to reach the above functional goals is: 11-15 days. 9. Does the patient have adequate social supports and living environment to accommodate these  discharge functional goals? Potentially 10. Anticipated D/C setting: Home 11. Anticipated post D/C treatments: HH therapy and Home excercise program 12. Overall Rehab/Functional Prognosis: good  RECOMMENDATIONS: This patient's condition is appropriate for continued rehabilitative care in the following setting: Likely CIR, will need to await completion of medical workup.  Patient has agreed to participate in recommended program. Potentially Note that insurance prior authorization may be required for reimbursement for recommended care.  Comment: Rehab Admissions Coordinator to follow up.  Raeleigh Guinn Lorie Phenix, MD, Mellody Drown 09/05/2016

## 2016-09-05 NOTE — Progress Notes (Signed)
[facility] ADULT ICU REPLACEMENT PROTOCOL FOR AM LAB REPLACEMENT ONLY  The patient does apply for the [facility] Adult ICU Electrolyte Replacment Protocol based on the criteria listed below:   1. Is GFR >/= 40 ml/min? Yes.    Patient's GFR today is >60 2. Is urine output >/= 0.5 ml/kg/hr for the last 6 hours? Yes.   Patient's UOP is 0.9 ml/kg/hr 3. Is BUN < 60 mg/dL? Yes.    Patient's BUN today is 21 4. Abnormal electrolyte(s): K+3.2 5. Ordered repletion with: protocol 6. If a panic level lab has been reported, has the CCM MD in charge been notified? No..   Physician:  Merlene Laughter [place]illiard 09/05/2016 4:19 AM

## 2016-09-05 NOTE — Progress Notes (Signed)
PULMONARY / CRITICAL CARE MEDICINE   Name: Dean [name] MRN: [id number] DOB: 1950-12-20    ADMISSION DATE:  08/27/2016   REFERRING MD:  EDP  CHIEF COMPLAINT:   Post arrest   Brief 65 yo MO (310 lbs 5'5") AAM who was walking back from the bathroom 12/3 when he collapsed pulseless. CPR wa not performed by family and EMS arrived in 10 minutes and performed CPR x 21 minutes, 7 rounds of epi, 5 shocks, amiodarone and magnesium. He coded again with ROSC. King airway was changed by EDP. He was taken to CT scanner with NAD of brain, transfer to CCU and cardiology was called. Hypothermia protocol was initiated at 1545 after ct head. His prognosis for meaningful recover is poor.  LINES/TUBES: 12/3 ET>>12/7 12/3 left I J CVL>>12/8   SIGNIFICANT EVENTS: 12/3 Cardiac arrest  12/5 > Re-Warmed, off sedation  12/7 extubated  12/8 Acutely confused overnight, pulled CVC, ativan ordered. Mittens in place  12/9 - wife and daughter at bedside - feel encephalopathy is rapidly improving. sTill sleepy but much better. They think he is ready to eat. STill per RN can slip out of bed and needs siter. No on infusion. opulse ox 97% 2L [place]. 94% on RA x 32min   SUBJECTIVE/OVERNIGHT/INTERVAL HX No events overnight. Off precedex this morning.   VITAL SIGNS: BP (!) 156/78 (BP Location: Right Leg)   Pulse 66   Temp 98.8 F (37.1 C) (Oral)   Resp (!) 22   Ht 5\' 5"  (1.651 m)   Wt 117.3 kg (258 lb 9.6 oz)   SpO2 96%   BMI 43.03 kg/m   HEMODYNAMICS:    VENTILATOR SETTINGS:    INTAKE / OUTPUT: I/O last 3 completed shifts: In: 2420.6 [I.V.:2005.6; IV [id number] Out: 1960 [id number]  PHYSICAL EXAMINATION: General:  Adult male, no distress, in chair  Neuro:  Alert, Oriented, follows commands HEENT:  [place]/AT, PERRL, EOM-I and MMM. Cardiovascular:  RRR, S1/S2, no MRG Lungs: unlabored, on nasal cannula, diffuse rhonchi   Abdomen: Obese soft, non-tender, active bowel sounds  Musculoskeletal: no  deformities  Skin: dry, intact, warm    LABS: PULMONARY No results for input(s): PHART, PCO2ART, PO2ART, HCO3, TCO2, O2SAT in the last 168 hours.  Invalid input(s): PCO2, PO2 CBC  Recent Labs Lab 09/01/16 0248 09/02/16 0541 09/05/16 0255  HGB 10.6* 12.1* 12.3*  HCT 34.0* 39.3 39.4  WBC 9.5 12.1* 11.3*  PLT 209 232 243   COAGULATION No results for input(s): INR in the last 168 hours. CARDIAC No results for input(s): TROPONINI in the last 168 hours. No results for input(s): PROBNP in the last 168 hours.  CHEMISTRY  Recent Labs Lab 08/30/16 0720 08/31/16 0427 09/01/16 0248 09/02/16 0541 09/05/16 0255  NA 143 144 149* 148* 144  K 3.5 3.3* 3.6 3.5 3.2*  CL 106 107 110 109 106  CO2 26 28 30 27 27   GLUCOSE 106* 110* 105* 115* 103*  BUN 11 16 17 16  21*  CREATININE 1.14 1.27* 1.10 1.02 1.14  CALCIUM 9.4 9.5 9.9 10.3 10.0  MG 1.8 1.8 2.2 2.2 2.3  PHOS 2.3* 2.7 3.8 2.9 2.7   Estimated Creatinine Clearance: 75.6 mL/min (by C-G formula based on SCr of 1.14 mg/dL).  LIVER  Recent Labs Lab 09/01/16 1019  AST 68*  ALT 62  ALKPHOS 83  BILITOT 3.7*  PROT 7.5  ALBUMIN 3.3*   INFECTIOUS No results for input(s): LATICACIDVEN, PROCALCITON in the last 168 hours. ENDOCRINE CBG (last 3)  Recent Labs  09/05/16 0034 09/05/16 0416 09/05/16 0807  GLUCAP 90 104* 97   IMAGING x48h  - image(s) personally visualized  -   highlighted in bold Dg Chest Port 1 View  Result Date: 09/04/2016 CLINICAL DATA:  Chronic respiratory failure with hypoxia EXAM: PORTABLE CHEST 1 VIEW COMPARISON:  Chest x-ray of August 31, 2016 FINDINGS: The trachea and esophagus have been extubated. The left internal jugular venous catheter has been removed. The right lung is adequately inflated. On the left there is volume loss with obscuration of the hemidiaphragm and portions of the left heart border. The cardiac silhouette remains enlarged. The pulmonary vascularity remains engorged. The trachea is  midline. The bony thorax exhibits no acute abnormality. There is calcification in the wall of the aortic arch. IMPRESSION: Improved appearance of the right lung with decreased interstitial edema. Persistent left basilar atelectasis or pneumonia with small left pleural effusion. Stable cardiomegaly. Thoracic aortic atherosclerosis. Electronically Signed   By: David  Martinique M.D.   On: 09/04/2016 07:13   STUDIES:  Echo > EF 35-40% grade 2 DD  CULTURES: 12/3 bc>>negative   ANTIBIOTICS: DISCUSSION: 65 yo MO (310 lbs 5'5") AAM who was walking back from the bathroom 12/3 when he collapsed pulseless. CPR wa not performed by family and EMS arrived in 10 minutes and performed CPR x 21 minutes, 7 rounds of epi, 5 shocks, amiodarone and magnesium. He coded again with ROSC. King airway was changed by EDP. He was taken to CT scanner with NAD of brain, transfer to CCU and cardiology was called. Hypothermia protocol was initiated at 1545 after ct head.  ASSESSMENT / PLAN:  PULMONARY A: Post cardiac arrest 12/3 with 31 minutes down Hx of Asthma P:   Supplemental oxygen for saturation >92. Follow CXR. Monitor for airway protection.  CARDIOVASCULAR A:  VT / VF arrest, ? Secondary to resp process Diastolic HF - grade 2 diastolic dysfunction ef [id number]  QTC 519msec  On 09/02/16 and 400s 09/03/16 per RN   P:  Cardiology following Cardiac Monitoring  Continue metoprolol, prn hydralazine Defer L cath for now - per Cards   RENAL A:   ARI - improving  Hypokalemia   -3.8L since admissions  P:   Lasix 20 meq BID per Cards  Potassium 30 mEq once  Avoid nephrotoxins Monitor BMP and UOP with current support Decrease D5 @ 20 ml/hr   GASTROINTESTINAL A:   GI protection Dysphagia  P:   PPI FEES today   HEMATOLOGIC A:   DVT prophylaxis P:  Heparin sq Trend CBC   INFECTIOUS A:   No overt infection P:   Trend fever and WBC curve   ENDOCRINE A:   DM P:   SSI  NEUROLOGIC A:   Post  Cardiac arrest CT head appears negative Agitation - delirium ? Vs anoxic injury  QTC 12/11 - 473 P:   RASS goal:-1 to 0 Neurology following  Avoid sedative medications  Avoid qtc prolongation E-Sitter at bedside - continue Precedex off 12/12 am    FAMILY  - Updates: No family bedside 12/12  - Inter-disciplinary family meet or Palliative Care meeting due by: 09/08/16  Hayden Pedro, AG-ACNP [facility]  Pgr: [phone]  PCCM Pgr: [phone]  Attending Note:  65 year old male with PMH of extensive cardiac disease who presents to the hospital post cardiac arrest.  Underwent the hypothermia protocol and now much improved from a delirium standpoint.  On exam, lungs are clear.  I  reviewed CXR myself, cardiomegaly noted but no acute abnormalities.  Discussed with TRH-MD and PCCM-NP.  Cardiac arrest:  - Continue tele monitoring.  - Per cards.  Delirium:  - D/C precedex.  - Minimize sedatives.  - Sitter.  Hypoxemia:  - Titrate O2 for sats of 88-92%.  - May need home O2, will re-evaluate when ready to discharge.  Hypokalemia and fluid overload:  - Lasix  - K replacement  Dysphagia:  - SLP today.  - Diet per SLP.  Transfer to tele.  And transfer care to [facility] with PCCM off 12/13.  Patient seen and examined, agree with above note.  I dictated the care and orders written for this patient under my direction.  Rush Farmer, MD [phone]

## 2016-09-05 NOTE — Evaluation (Signed)
Occupational Therapy Evaluation Patient Details Name: Dean [name] MRN: [id number] DOB: 10/04/1950 Today's Date: 09/05/2016    History of Present Illness Pt is a 65 y.o. male with PMH of asthma, DM, gout, HTN who callapsed pulseless on 12/3. No CPR x 10 minutes then requiring CPR x 21 minutes, 7 rounds of epi, 5 shocks, amiodarone and magnesium, was intubated. EEG 12/4 was markedly abnormal wiht severe diffuse cerebral dysfunction. Pt was extubated 12/7,  PMH - asthma, DM, HTN, gout   Clinical Impression   Pt admitted with above. He demonstrates the below listed deficits and will benefit from continued OT to maximize safety and independence with BADLs.  Pt presents to OT with generalized weakness, impaired balance, impaired cognition.  He requires min - mod A for ADLs.  Recommend CIR to allow him to return home with 24 hour supervision       Follow Up Recommendations  CIR    Equipment Recommendations  3 in 1 bedside commode;Tub/shower seat    Recommendations for Other Services       Precautions / Restrictions Precautions Precautions: Fall Restrictions Weight Bearing Restrictions: No      Mobility Bed Mobility               General bed mobility comments: Pt up in chair  Transfers Overall transfer level: Needs assistance Equipment used: Standard walker Transfers: Sit to/from Stand;Stand Pivot Transfers Sit to Stand: Min assist Stand pivot transfers: Min assist       General transfer comment: min A for balance and safety     Balance Overall balance assessment: Needs assistance Sitting-balance support: Feet supported Sitting balance-Leahy Scale: Good Sitting balance - Comments: pt able to retrieve item from floor, while sitting in chair, without LOB    Standing balance support: Bilateral upper extremity supported Standing balance-Leahy Scale: Poor Standing balance comment: min A for balance and bil. UE support                              ADL Overall ADL's : Needs assistance/impaired Eating/Feeding: Independent   Grooming: Wash/dry hands;Wash/dry face;Brushing hair;Set up;Supervision/safety;Sitting   Upper Body Bathing: Minimal assistance;Sitting Upper Body Bathing Details (indicate cue type and reason): for thoroughness  Lower Body Bathing: Moderate assistance;Sit to/from stand   Upper Body Dressing : Moderate assistance;Sitting   Lower Body Dressing: Moderate assistance;Sit to/from stand Lower Body Dressing Details (indicate cue type and reason): able to don socks  Toilet Transfer: Minimal assistance;Squat-pivot;RW;BSC   Toileting- Clothing Manipulation and Hygiene: Maximal assistance;Sit to/from stand Toileting - Clothing Manipulation Details (indicate cue type and reason): pt incontinent of stool.  Assisted with peri care      Functional mobility during ADLs: Minimal assistance;Rolling walker       Vision     Perception     Praxis      Pertinent Vitals/Pain Pain Assessment: No/denies pain     Hand Dominance Right   Extremity/Trunk Assessment Upper Extremity Assessment Upper Extremity Assessment: Generalized weakness   Lower Extremity Assessment Lower Extremity Assessment: Defer to PT evaluation   Cervical / Trunk Assessment Cervical / Trunk Assessment: Normal   Communication Communication Communication: No difficulties   Cognition Arousal/Alertness: Awake/alert Behavior During Therapy: Flat affect;Impulsive Overall Cognitive Status: Impaired/Different from baseline Area of Impairment: Orientation;Attention;Memory;Following commands;Safety/judgement;Awareness;Problem solving Orientation Level: Disoriented to;Time Current Attention Level: Sustained Memory: Decreased short-term memory Following Commands: Follows one step commands consistently Safety/Judgement: Decreased awareness of safety;Decreased awareness of deficits  Awareness: Intellectual Problem Solving: Slow  processing;Difficulty sequencing;Requires verbal cues;Requires tactile cues General Comments: Pt unable to recall wife's phone number or sister's phone number.  He states he has 5 children, but is unsure if any live with him, or if they live close.  He is unsure if wife works.  He is impulsive.  He requires min cues for sequencing of simple tasks.  He was incontinent of stool with no awareness    General Comments       Exercises       Shoulder Instructions      Home Living Family/patient expects to be discharged to:: Private residence Living Arrangements: Spouse/significant other Available Help at Discharge: Family   Home Access: Level entry     Home Layout: One level               Home Equipment: None   Additional Comments: Pt unable to provide accurate history.   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, commercial - no family present to confirm         OT Problem List: Decreased strength;Decreased activity tolerance;Impaired balance (sitting and/or standing);Decreased cognition;Decreased safety awareness;Decreased knowledge of use of DME or AE;Cardiopulmonary status limiting activity;Obesity   OT Treatment/Interventions: Self-care/ADL training;Energy conservation;DME and/or AE instruction;Therapeutic activities;Cognitive remediation/compensation;Patient/family education;Balance training    OT Goals(Current goals can be found in the care plan section) Acute Rehab OT Goals Patient Stated Goal: to go home  OT Goal Formulation: With patient Time For Goal Achievement: 09/19/16 Potential to Achieve Goals: Good ADL Goals Pt Will Perform Grooming: with supervision;standing Pt Will Perform Upper Body Bathing: with set-up;with supervision;sitting Pt Will Perform Lower Body Bathing: with supervision;sit  to/from stand Pt Will Perform Upper Body Dressing: with set-up;with supervision;sitting Pt Will Perform Lower Body Dressing: with supervision;sit to/from stand Pt Will Transfer to Toilet: with supervision;ambulating;bedside commode;grab bars;regular height toilet Additional ADL Goal #1: Pt will demonstrate selective attention during ADL tasks.   OT Frequency: Min 2X/week   Barriers to D/C: Decreased caregiver support  unsure if wife able to provide 24 hour assist        Co-evaluation              End of Session Equipment Utilized During Treatment: Gait belt;Rolling walker Nurse Communication: Mobility status  Activity Tolerance: Patient tolerated treatment well Patient left: in chair;with call bell/phone within reach;with chair alarm set   Time: [id number] OT Time Calculation (min): 27 min Charges:  OT General Charges $OT Visit: 1 Procedure OT Evaluation $OT Eval Moderate Complexity: 1 Procedure OT Treatments $Self Care/Home Management : 8-22 mins G-Codes:    Mekesha Solomon M 2016/10/03, 4:00 PM

## 2016-09-06 DIAGNOSIS — I5021 Acute systolic (congestive) heart failure: Secondary | ICD-10-CM

## 2016-09-06 LAB — CBC
HCT: 41.5 % (ref 39.0–52.0)
Hemoglobin: 13 g/dL (ref 13.0–17.0)
MCH: 26.5 pg (ref 26.0–34.0)
MCHC: 31.3 g/dL (ref 30.0–36.0)
MCV: 84.7 fL (ref 78.0–100.0)
Platelets: 227 10*3/uL (ref 150–400)
RBC: 4.9 MIL/uL (ref 4.22–5.81)
RDW: 14 % (ref 11.5–15.5)
WBC: 14.3 10*3/uL — ABNORMAL HIGH (ref 4.0–10.5)

## 2016-09-06 LAB — BASIC METABOLIC PANEL
Anion gap: 15 (ref 5–15)
BUN: 19 mg/dL (ref 6–20)
CO2: 18 mmol/L — ABNORMAL LOW (ref 22–32)
Calcium: 10 mg/dL (ref 8.9–10.3)
Chloride: 112 mmol/L — ABNORMAL HIGH (ref 101–111)
Creatinine, Ser: 1.05 mg/dL (ref 0.61–1.24)
GFR calc Af Amer: >60 mL/min (ref >60)
GFR calc non Af Amer: >60 mL/min (ref >60)
Glucose, Bld: 95 mg/dL (ref 65–99)
Potassium: 4 mmol/L (ref 3.5–5.1)
Sodium: 145 mmol/L (ref 135–145)

## 2016-09-06 LAB — GLUCOSE, CAPILLARY
Glucose-Capillary: 110 mg/dL — ABNORMAL HIGH (ref 65–99)
Glucose-Capillary: 157 mg/dL — ABNORMAL HIGH (ref 65–99)
Glucose-Capillary: 124 mg/dL — ABNORMAL HIGH (ref 65–99)
Glucose-Capillary: 145 mg/dL — ABNORMAL HIGH (ref 65–99)
Glucose-Capillary: 142 mg/dL — ABNORMAL HIGH (ref 65–99)
Glucose-Capillary: 135 mg/dL — ABNORMAL HIGH (ref 65–99)
Glucose-Capillary: 147 mg/dL — ABNORMAL HIGH (ref 65–99)

## 2016-09-06 LAB — PHOSPHORUS: Phosphorus: 2.4 mg/dL — ABNORMAL LOW (ref 2.5–4.6)

## 2016-09-06 LAB — MAGNESIUM: Magnesium: 2 mg/dL (ref 1.7–2.4)

## 2016-09-06 MED ORDER — INSULIN ASPART 100 UNIT/ML ~~LOC~~ SOLN
0.0000 [IU] | Freq: Three times a day (TID) | SUBCUTANEOUS | Status: DC
  Administered 2016-09-06 – 2016-09-10 (×6): 1 [IU] via SUBCUTANEOUS
  Administered 2016-09-11: 2 [IU] via SUBCUTANEOUS
  Administered 2016-09-13: 1 [IU] via SUBCUTANEOUS

## 2016-09-06 MED ORDER — ASPIRIN 81 MG PO CHEW
81.0000 mg | CHEWABLE_TABLET | ORAL | Status: AC
  Administered 2016-09-07: 81 mg via ORAL
  Filled 2016-09-06: qty 1

## 2016-09-06 MED ORDER — SODIUM CHLORIDE 0.9% FLUSH
3.0000 mL | Freq: Two times a day (BID) | INTRAVENOUS | Status: DC
  Administered 2016-09-06 – 2016-09-07 (×2): 3 mL via INTRAVENOUS

## 2016-09-06 MED ORDER — FAMOTIDINE 20 MG PO TABS
20.0000 mg | ORAL_TABLET | Freq: Two times a day (BID) | ORAL | Status: DC
  Administered 2016-09-06 – 2016-09-15 (×16): 20 mg via ORAL
  Filled 2016-09-06 (×16): qty 1

## 2016-09-06 MED ORDER — SODIUM CHLORIDE 0.9 % IV SOLN: 250.0000 mL | INTRAVENOUS | Status: DC | PRN

## 2016-09-06 MED ORDER — SODIUM CHLORIDE 0.9 % IV SOLN: INTRAVENOUS | Status: DC

## 2016-09-06 MED ORDER — DOCUSATE SODIUM 50 MG/5ML PO LIQD
100.0000 mg | Freq: Every day | ORAL | Status: DC
  Administered 2016-09-08 – 2016-09-15 (×4): 100 mg via ORAL
  Filled 2016-09-06 (×9): qty 10

## 2016-09-06 MED ORDER — METOPROLOL TARTRATE 25 MG PO TABS
25.0000 mg | ORAL_TABLET | Freq: Two times a day (BID) | ORAL | Status: DC
  Administered 2016-09-06: 25 mg via ORAL
  Filled 2016-09-06: qty 1

## 2016-09-06 MED ORDER — SODIUM CHLORIDE 0.9% FLUSH: 3.0000 mL | INTRAVENOUS | Status: DC | PRN

## 2016-09-06 MED ORDER — ACETAMINOPHEN 160 MG/5ML PO SOLN
650.0000 mg | ORAL | Status: DC | PRN
  Administered 2016-09-08: 650 mg via ORAL
  Filled 2016-09-06 (×3): qty 20.3

## 2016-09-06 NOTE — Progress Notes (Signed)
Pt tray requested from pharmacy, per new diet order.

## 2016-09-06 NOTE — Progress Notes (Signed)
Nutrition Follow-up  DOCUMENTATION CODES:   Morbid obesity  INTERVENTION:  Encourage healthy/balanced PO intake   NUTRITION DIAGNOSIS:   Inadequate oral intake related to inability to eat as evidenced by NPO status.  discontinued  GOAL:   Patient will meet greater than or equal to 90% of their needs  [place]  MONITOR:   Vent status, Labs, Weight trends, Skin, I & O's  REASON FOR ASSESSMENT:   Ventilator    ASSESSMENT:   65 yo MO (310 lbs 5'5") AAM who was walking back from the bathroom 12/3 when he collapsed pulseless. CPR wa not performed by family and EMS arrived in 10 minutes and performed CPR x 21 minutes, 7 rounds of epi, 5 shocks, amiodarone and magnesium. He coded again with ROSC. King airway was changed by EDP. He was taken to CT scanner with NAD of brain, transfer to CCU and cardiology was called. Hypothermia protocol was initiated at 1545 after ct head. His prognosis for meaningful recover is poor.  Pt was extubated on 12/7. Pt's diet was advanced to Dysphagia 3, nectar-thick liquids yesterday afternoon after FEES by SLP. Pt awake at time of visit. Pt reports having a good appetite and eating 100% of breakfast this morning; per nursing notes he at 85%. Pt reports that he usually weighs around 250 lbs. Pt denies any questions or concerns at this time. RD encouraged healthful PO intake with fruits and vegetables and lean protein at each meal.   Labs reviewed.   Diet Order:  DIET DYS 3 Room service appropriate? Yes; Fluid consistency: Nectar Thick  Skin:  Reviewed, no issues  Last BM:  12/12  Height:   Ht Readings from Last 1 Encounters:  09/05/16 5\' 6"  (1.676 m)    Weight:   Wt Readings from Last 1 Encounters:  09/06/16 259 lb (117.5 kg)    Ideal Body Weight:  61.8 kg  BMI:  Body mass index is 41.8 kg/m.  Estimated Nutritional Needs:   Kcal:  1900-2100  Protein:  100-120 grams  Fluid:  > 1.9 L/day  EDUCATION NEEDS:   No education  needs identified at this time  Scarlette Ar RD, CSP, LDN Inpatient Clinical Dietitian Pager: [phone] After Hours Pager: [phone]

## 2016-09-06 NOTE — Progress Notes (Signed)
Patient Name: Dean [name] Date of Encounter: 09/06/2016  Primary Cardiologist: Dr. [facility] Problem List     Principal Problem:   Cardiopulmonary arrest [facility]) Active Problems:   DM2 (diabetes mellitus, type 2) ([place])   Morbid obesity ([place])   Essential hypertension, benign   Acute encephalopathy   Acute respiratory failure ([place])   Uncomplicated asthma   Diabetes mellitus type 2 in obese ([facility])   Acute on chronic combined systolic and diastolic CHF (congestive heart failure) ([facility])   Tachypnea   Hypernatremia   Hypokalemia   Leukocytosis   Acute blood loss anemia   Hypoxemia     Subjective   Answering questions.  No chest pain.  Feels ok.  Passed swallow study with some restrictions.  Able to swallow pills.  Inpatient Medications    Scheduled Meds: . [START ON 09/07/2016] aspirin  81 mg Oral Pre-Cath  . chlorhexidine  15 mL Mouth Rinse BID  . [START ON 09/07/2016] docusate  100 mg Oral Daily  . famotidine  20 mg Oral BID  . furosemide  20 mg Intravenous Q12H  . heparin  5,000 Units Subcutaneous Q8H  . insulin aspart  0-9 Units Subcutaneous TID WC  . mouth rinse  15 mL Mouth Rinse q12n4p  . metoprolol tartrate  25 mg Oral BID  . nitroGLYCERIN  1 inch Topical Q6H  . sodium chloride flush  3 mL Intravenous Q12H   Continuous Infusions: . sodium chloride Stopped (08/31/16 2300)  . sodium chloride Stopped (09/06/16 1716)   PRN Meds: sodium chloride, sodium chloride, acetaminophen (TYLENOL) oral liquid 160 mg/5 mL, hydrALAZINE, RESOURCE THICKENUP CLEAR, sodium chloride flush, sodium chloride flush   Vital Signs    Vitals:   09/05/16 2240 09/06/16 0058 09/06/16 0358 09/06/16 1211  BP: (!) 168/44 (!) 159/52 (!) 153/58 (!) 162/64  Pulse:  63 96 82  Resp:  20 20 18   Temp:  97.1 F (36.2 C) 98.8 F (37.1 C) 98 F (36.7 C)  TempSrc:  Oral Oral Oral  SpO2:  98% 99% 99%  Weight:   259 lb (117.5 kg)   Height:        Intake/Output Summary (Last 24  hours) at 09/06/16 1746 Last data filed at 09/06/16 1641  Gross per 24 hour  Intake              720 ml  Output             1320 ml  Net             -600 ml   Filed Weights   09/04/16 0300 09/05/16 1349 09/06/16 0358  Weight: 258 lb 9.6 oz (117.3 kg) 252 lb 6.4 oz (114.5 kg) 259 lb (117.5 kg)    Physical Exam    GEN: Resting comfortably.  HEENT: Grossly normal.  Neck:  no JVD,  Cardiac: RRR, no murmurs, rubs, or gallops. No clubbing, cyanosis, edema.  Radials/DP/PT 2+ and equal bilaterally.  Respiratory:  Respirations regular and unlabored, clear to auscultation bilaterally. GI: Soft, nontender, nondistended, BS + x 4. MS: no deformity or atrophy. Skin: warm and dry, no rash. Neuro:  Strength and sensation are intact. Psych: Answering questions appropriately  Labs    CBC  Recent Labs  09/05/16 0255 09/06/16 0431  WBC 11.3* 14.3*  HGB 12.3* 13.0  HCT 39.4 41.5  MCV 85.1 84.7  PLT 243 [id number]   Basic Metabolic Panel  Recent Labs  09/05/16 0255 09/06/16 0431 09/06/16 1042  NA  144 145  --   K 3.2* 4.0  --   CL 106 112*  --   CO2 27 18*  --   GLUCOSE 103* 95  --   BUN 21* 19  --   CREATININE 1.14 1.05  --   CALCIUM 10.0 10.0  --   MG 2.3  --  2.0  PHOS 2.7 2.4*  --    Liver Function Tests No results for input(s): AST, ALT, ALKPHOS, BILITOT, PROT, ALBUMIN in the last 72 hours. No results for input(s): LIPASE, AMYLASE in the last 72 hours. Cardiac Enzymes No results for input(s): CKTOTAL, CKMB, CKMBINDEX, TROPONINI in the last 72 hours. BNP Invalid input(s): POCBNP D-Dimer No results for input(s): DDIMER in the last 72 hours. Hemoglobin A1C No results for input(s): HGBA1C in the last 72 hours. Fasting Lipid Panel No results for input(s): CHOL, HDL, LDLCALC, TRIG, CHOLHDL, LDLDIRECT in the last 72 hours. Thyroid Function Tests No results for input(s): TSH, T4TOTAL, T3FREE, THYROIDAB in the last 72 hours.  Invalid input(s): FREET3  Telemetry    NSR,  PVCs, couplets - Personally Reviewed  ECG    NSR, IRBBB, NSST  Radiology    No results found.  Cardiac Studies   EF 35-40%  Patient Profile     65 y/o with cardiac arrest  Assessment & Plan    1) Cardiac arrest.  Mental status improved.  Plan for cardiac cath tomorrow.  Patient and wife are agreeable. He has had some NSVT.  All questions answered.  EF decreased.  Does not appear to be in heart failure. Will need ACE-I and beta blocker as BP and renal function allow.   HTN: BP controlled.   Signed, Larae Grooms, MD  09/06/2016, 5:46 PM

## 2016-09-06 NOTE — Progress Notes (Signed)
    Talked with the patient's wife on the phone, Rodney Shamburg regarding the plan for cardiac catheterization. Discussed the procedure and risks. Wife understood and agreeable for cath. Will plan for tomorrow. Cath orders placed.

## 2016-09-06 NOTE — Care Management Important Message (Signed)
Important Message  Patient Details  Name: Dean [name] MRN: [id number] Date of Birth: 07/10/1951   Medicare Important Message Given:  Yes    [name] Montine Circle 09/06/2016, 10:46 AM

## 2016-09-06 NOTE — Progress Notes (Signed)
Physical Therapy Treatment Patient Details Name: Dean [name] MRN: [id number] DOB: 04-18-1951 Today's Date: 09/06/2016    History of Present Illness Pt is a 65 y.o. male with PMH of asthma, DM, gout, HTN who callapsed pulseless on 12/3. No CPR x 10 minutes then requiring CPR x 21 minutes, 7 rounds of epi, 5 shocks, amiodarone and magnesium, was intubated. EEG 12/4 was markedly abnormal wiht severe diffuse cerebral dysfunction. Pt was extubated 12/7,  PMH - asthma, DM, HTN, gout    PT Comments    Pt showing increased independence with supine > sit this session.  He needs cues for safety with mobility.  Pt needing multiple reminders during session of swallowing precautions.  Discussed with speech therapist.  Follow Up Recommendations  CIR     Equipment Recommendations  Rolling walker with 5" wheels    Recommendations for Other Services       Precautions / Restrictions Precautions Precautions: Fall Restrictions Weight Bearing Restrictions: No    Mobility  Bed Mobility Overal bed mobility: Needs Assistance Bed Mobility: Supine to Sit     Supine to sit: Min guard     General bed mobility comments: cues for hand placement and to bring leg over.  Pt able to power self up to sitting from sidelying  Transfers Overall transfer level: Needs assistance Equipment used: Rolling walker (2 wheeled) Transfers: Sit to/from Stand Sit to Stand: Min assist         General transfer comment: MIN A to power up  Ambulation/Gait Ambulation/Gait assistance: Min assist Ambulation Distance (Feet): 100 Feet Assistive device: Rolling walker (2 wheeled) Gait Pattern/deviations: Step-through pattern;Trunk flexed;Drifts right/left     General Gait Details: Cues for positioning within RW.  Pt fatigues quickly.   Stairs            Wheelchair Mobility    Modified Rankin (Stroke Patients Only)       Balance     Sitting balance-Leahy Scale: Good     Standing balance  support: Bilateral upper extremity supported Standing balance-Leahy Scale: Poor Standing balance comment: requires UE support                    Cognition Arousal/Alertness: Awake/alert Behavior During Therapy: Flat affect;Impulsive Overall Cognitive Status: Impaired/Different from baseline   Orientation Level: Disoriented to;Time   Memory: Decreased short-term memory Following Commands: Follows one step commands consistently Safety/Judgement: Decreased awareness of safety;Decreased awareness of deficits   Problem Solving: Slow processing;Difficulty sequencing;Requires verbal cues;Requires tactile cues General Comments: Pt asking how long he had been in the hospital.  Friend present.    Exercises      General Comments        Pertinent Vitals/Pain Pain Assessment: No/denies pain    Home Living                      Prior Function            PT Goals (current goals can now be found in the care plan section) Acute Rehab PT Goals PT Goal Formulation: With patient Time For Goal Achievement: 09/18/16 Potential to Achieve Goals: Good Progress towards PT goals: Progressing toward goals    Frequency    Min 3X/week      PT Plan Current plan remains appropriate    Co-evaluation             End of Session Equipment Utilized During Treatment: Gait belt Activity Tolerance: Patient tolerated treatment well Patient left: in  chair;with call bell/phone within reach;with chair alarm set;with nursing/sitter in room     Time: [id number] PT Time Calculation (min) (ACUTE ONLY): 16 min  Charges:  $Gait Training: 8-22 mins                    G Codes:      Mimie Goering LUBECK 09/06/2016, 1:03 PM

## 2016-09-06 NOTE — Clinical Social Work Note (Addendum)
CSW called and left voicemail for patient's wife with contact information. Will discuss PT recommendations and disposition when she calls back.  Dayton Scrape, [place]

## 2016-09-06 NOTE — Progress Notes (Signed)
Speech Language Pathology Treatment: Dysphagia  Patient Details Name: Dean [name] MRN: [id number] DOB: [birth year] Today's Date: 09/06/2016 Time: [id number] SLP Time Calculation (min) (ACUTE ONLY): 15 min  Assessment / Plan / Recommendation Clinical Impression  RN reports pt had coughing during breakfast meal. Pt consumed nectar thick liquids by large, consecutive sips despite cueing from SLP for slower pacing, and delayed coughing was observed. Per FEES on previous date, pt did have deep penetration of nectar thick liquids when consumed in larger quantities. Trials of honey thick liquids did appear to reduce overt signs of coughing. Pt was also impulsive with solid intake as well despite cueing from therapist, resulting in prolonged mastication and mild difficulty with oral clearance. Recommend downgrading diet to Dys 2 textures and honey thick liquids. Pt will need close supervision during meals due to mentation (he does not recall having a swallow study done and does not remember recommended strategies). SLP to f/u to assess for tolerance and determine if additional testing is indicated.    HPI HPI: Pt is a 65 y.o. male with PMH of asthma, DM, gout, HTN who callapsed pulseless on 12/3 requiring CPR x 21 minutes, 7 rounds of epi, 5 shocks, amiodarone and magnesium, was intubated. EEG 12/4 was markedly abnormal wiht severe diffuse cerebral dysfunction. Pt was extubated 12/7. Pt reportedly doing better on this date, family wanting him to eat. Bedside swallow eval ordered. CXR 12/7 showed increasing bibasilar atelectasis and small left effusion.      SLP Plan  Continue with current plan of care     Recommendations  Diet recommendations: Dysphagia 2 (fine chop);Honey-thick liquid Liquids provided via: Cup;No straw Medication Administration: Whole meds with puree Supervision: Patient able to self feed;Full supervision/cueing for compensatory strategies Compensations: Slow rate;Small  sips/bites Postural Changes and/or Swallow Maneuvers: Seated upright 90 degrees;Upright 30-60 min after meal                Oral Care Recommendations: Oral care BID Follow up Recommendations: Inpatient Rehab Plan: Continue with current plan of care       GO                Dean [name] 09/06/2016, 1:32 PM  Dean [name], M.A. CCC-SLP [phone]

## 2016-09-06 NOTE — Progress Notes (Signed)
Rehab admissions - I spoke with unit case manager.  Patient not medically ready at this time.  He will need a cardiac cath in the next few days per cards.  I have updated insurance case [employer]ight forward[employer].  Will await medical readiness.  Call me for questions.  [id number]

## 2016-09-06 NOTE — Progress Notes (Signed)
Respiratory therapist notified regarding the CPAP.

## 2016-09-06 NOTE — Progress Notes (Addendum)
PROGRESS NOTE  Dean [name]  [id number] DOB: [birth year]  DOA: 08/27/2016 PCP: [name], MD   Brief Narrative:  65 yo MO (310 lbs 5'5") AAM who was walking back from the bathroom 12/3 when he collapsed pulseless. CPR wa not performed by family and EMS arrived in 10 minutes and performed CPR x 21 minutes, 7 rounds of epi, 5 shocks, amiodarone and magnesium. He coded again with ROSC. King airway was changed by EDP. He was taken to CT scanner with NAD of brain, transfer to CCU and cardiology was called. Hypothermia protocol was initiated at 1545 after ct head. After clinical improvement and stabilization, care transferred from [facility] to [facility] on 09/06/16   SIGNIFICANT EVENTS: 12/3 Cardiac arrest  12/5 > Re-Warmed, off sedation  12/7 extubated  12/8 Acutely confused overnight, pulled CVC, ativan ordered. Mittens in place  12/9 - wife and daughter at bedside - feel encephalopathy is rapidly improving. sTill sleepy but much better. They think he is ready to eat. STill per RN can slip out of bed and needs siter. No on infusion. opulse ox 97% 2L . 94% on RA x 32min  Assessment & Plan:   Principal Problem:   Cardiopulmonary arrest ([place]) Active Problems:   DM2 (diabetes mellitus, type 2) ([place])   Morbid obesity ([place])   Essential hypertension, benign   Acute encephalopathy   Acute respiratory failure ([place])   Uncomplicated asthma   Diabetes mellitus type 2 in obese ([facility])   Acute on chronic combined systolic and diastolic CHF (congestive heart failure) ([facility])   Tachypnea   Hypernatremia   Hypokalemia   Leukocytosis   Acute blood loss anemia   Hypoxemia   1. Status post cardiac arrest 08/27/16 with 31 minutes down: VT/VF arrest,? Secondary to respiratory process. Cardiology following >plan is for cardiac cath when he can fully follow commands required for the procedure, may be ready for In the next couple of days. Continue cardiac monitoring. 2. Acute combine Systolic & Diastolic CHF/New  cardiomyopathy: Still appears mildly volume overloaded. Continue IV Lasix. -[id number] ML since admission. Consider ACEI/ARB 3. Acute respiratory failure with hypoxia: Extubated 12/7. Reassess prior to discharge regarding need for home oxygen. 4. Acute kidney injury: Resolved. 5. Hypokalemia: Replaced. Magnesium normal. 6. Dysphagia: Speech therapy reevaluated and recommended dysphagia 2 diet. 7. DM type II: Continue SSI. 8. Essential hypertension: Mildly uncontrolled. Currently on NTP transdermal and IV metoprolol (switched to by mouth 12/13) 9. Acute encephalopathy: Agitation/delirium versus anoxic injury. CT head negative. Avoid sedative and QT prolonging medications. E sitter in place. As per nursing, reports of impulsiveness overnight. Precedex discontinued 09/05/16. Improving. Neurology consulting. 10. History of asthma: Stable. Status post extubation 08/31/16. 11. Prolonged QTC: 508 ms on 09/02/16 and down to 473 ms on 09/04/16. 12. NSVT: Likely d/t CM. K & Mg OK. Change BB to PO. Discussed with cardiology and they were able to discussed with patient's spouse and plan for cardiac cath on 12/14. They recommended changing beta blockers to metoprolol 25 MG twice a day and continue NTP for now.   DVT prophylaxis: Heparin Code Status: Full Family Communication: None at bedside Disposition Plan: DC when medically stable,? CIR.   Consultants:   CCM-signed off  Cardiology  Procedures:  12/3 ET>>12/7 12/3 left I J CVL>>12/8 2-D echo 08/28/16: Study Conclusions  - Left ventricle: The cavity size was mildly dilated. Wall   thickness was increased in a pattern of severe LVH. Systolic   function was moderately reduced. The estimated ejection fraction  was in the range of 35% to 40%. Akinesis of the basalinferior and   apical myocardium. Features are consistent with a pseudonormal   left ventricular filling pattern, with concomitant abnormal   relaxation and increased filling pressure (grade 2  diastolic   dysfunction). - Aortic valve: There was mild regurgitation. - Mitral valve: There was mild regurgitation. - Left atrium: The atrium was severely dilated.  Antimicrobials:   None   Subjective: Seen this morning. Complained of cough after eating. Denied dyspnea or chest pain. As per RN, night nurses reported impulsiveness. No agitation noted. Denies any other complaints.  Objective:  Vitals:   09/05/16 2240 09/06/16 0058 09/06/16 0358 09/06/16 1211  BP: (!) 168/44 (!) 159/52 (!) 153/58 (!) 162/64  Pulse:  63 96 82  Resp:  20 20 18   Temp:  97.1 F (36.2 C) 98.8 F (37.1 C) 98 F (36.7 C)  TempSrc:  Oral Oral Oral  SpO2:  98% 99% 99%  Weight:   117.5 kg (259 lb)   Height:        Intake/Output Summary (Last 24 hours) at 09/06/16 1419 Last data filed at 09/06/16 1213  Gross per 24 hour  Intake              240 ml  Output             1020 ml  Net             -780 ml   Filed Weights   09/04/16 0300 09/05/16 1349 09/06/16 0358  Weight: 117.3 kg (258 lb 9.6 oz) 114.5 kg (252 lb 6.4 oz) 117.5 kg (259 lb)    Examination:  General exam: Pleasant middle-aged male sitting up comfortably in bed eating breakfast this morning. Respiratory system: Diminished breath sounds in the bases with few basal crackles. Rest of lung fields clear to auscultation. Respiratory effort normal. Cardiovascular system: S1 & S2 heard, RRR. No JVD, murmurs, rubs, gallops or clicks. No pedal edema. telemetry: Sinus rhythm, PVCs, BBB morphology. 20 beat NSVT on 09/05/16 at 4:30 PM.  Gastrointestinal system: Abdomen is nondistended, soft and nontender. No organomegaly or masses felt. Normal bowel sounds heard. Central nervous system: Alert and oriented to person and place only . No focal neurological deficits. Extremities: Symmetric 5 x 5 power. Skin: No rashes, lesions or ulcers Psychiatry: Judgement and insight impaired . Mood & affect appropriate.     Data Reviewed: I have personally  reviewed following labs and imaging studies  CBC:  Recent Labs Lab 08/31/16 0427 09/01/16 0248 09/02/16 0541 09/05/16 0255 09/06/16 0431  WBC 13.0* 9.5 12.1* 11.3* 14.3*  HGB 10.5* 10.6* 12.1* 12.3* 13.0  HCT 33.3* 34.0* 39.3 39.4 41.5  MCV 84.7 85.9 86.2 85.1 84.7  PLT 226 209 232 243 [id number]   Basic Metabolic Panel:  Recent Labs Lab 08/31/16 0427 09/01/16 0248 09/02/16 0541 09/05/16 0255 09/06/16 0431 09/06/16 1042  NA 144 149* 148* 144 145  --   K 3.3* 3.6 3.5 3.2* 4.0  --   CL 107 110 109 106 112*  --   CO2 28 30 27 27  18*  --   GLUCOSE 110* 105* 115* 103* 95  --   BUN 16 17 16  21* 19  --   CREATININE 1.27* 1.10 1.02 1.14 1.05  --   CALCIUM 9.5 9.9 10.3 10.0 10.0  --   MG 1.8 2.2 2.2 2.3  --  2.0  PHOS 2.7 3.8 2.9 2.7 2.4*  --  GFR: Estimated Creatinine Clearance: 83.5 mL/min (by C-G formula based on SCr of 1.05 mg/dL). Liver Function Tests:  Recent Labs Lab 09/01/16 1019  AST 68*  ALT 62  ALKPHOS 83  BILITOT 3.7*  PROT 7.5  ALBUMIN 3.3*   No results for input(s): LIPASE, AMYLASE in the last 168 hours.  Recent Labs Lab 09/01/16 1019  AMMONIA 26   Coagulation Profile: No results for input(s): INR, PROTIME in the last 168 hours. Cardiac Enzymes: No results for input(s): CKTOTAL, CKMB, CKMBINDEX, TROPONINI in the last 168 hours. BNP (last 3 results) No results for input(s): PROBNP in the last 8760 hours. HbA1C: No results for input(s): HGBA1C in the last 72 hours. CBG:  Recent Labs Lab 09/05/16 2102 09/06/16 0000 09/06/16 0342 09/06/16 0823 09/06/16 1134  GLUCAP 106* 122* 124* 145* 142*   Lipid Profile: No results for input(s): CHOL, HDL, LDLCALC, TRIG, CHOLHDL, LDLDIRECT in the last 72 hours. Thyroid Function Tests: No results for input(s): TSH, T4TOTAL, FREET4, T3FREE, THYROIDAB in the last 72 hours. Anemia Panel: No results for input(s): VITAMINB12, FOLATE, FERRITIN, TIBC, IRON, RETICCTPCT in the last 72 hours.  Sepsis Labs: No  results for input(s): PROCALCITON, LATICACIDVEN in the last 168 hours.  Recent Results (from the past 240 hour(s))  MRSA PCR Screening     Status: None   Collection Time: 08/27/16  4:35 PM  Result Value Ref Range Status   MRSA by PCR NEGATIVE NEGATIVE Final    Comment:        The GeneXpert MRSA Assay (FDA approved for NASAL specimens only), is one component of a comprehensive MRSA colonization surveillance program. It is not intended to diagnose MRSA infection nor to guide or monitor treatment for MRSA infections.   Culture, blood (Routine X 2) w Reflex to ID Panel     Status: None   Collection Time: 08/27/16  8:37 PM  Result Value Ref Range Status   Specimen Description BLOOD RIGHT HAND  Final   Special Requests BOTTLES DRAWN AEROBIC ONLY [place]  Final   Culture NO GROWTH 5 DAYS  Final   Report Status 09/01/2016 FINAL  Final  Culture, blood (Routine X 2) w Reflex to ID Panel     Status: None   Collection Time: 08/27/16  9:15 PM  Result Value Ref Range Status   Specimen Description BLOOD RIGHT HAND  Final   Special Requests IN PEDIATRIC BOTTLE [place]  Final   Culture NO GROWTH 5 DAYS  Final   Report Status 09/01/2016 FINAL  Final         Radiology Studies: No results found.      Scheduled Meds: . chlorhexidine  15 mL Mouth Rinse BID  . docusate  100 mg Per Tube Daily  . famotidine (PEPCID) IV  20 mg Intravenous Q12H  . furosemide  20 mg Intravenous Q12H  . heparin  5,000 Units Subcutaneous Q8H  . insulin aspart  0-15 Units Subcutaneous Q4H  . mouth rinse  15 mL Mouth Rinse q12n4p  . metoprolol  5 mg Intravenous Q6H  . nitroGLYCERIN  1 inch Topical Q6H  . potassium chloride  40 mEq Oral Once   Continuous Infusions: . sodium chloride Stopped (08/31/16 2300)  . dextrose Stopped (09/06/16 0840)     LOS: 10 days       [facility], MD [facility] Pager [phone]  If 7PM-7AM, please contact night-coverage [email or web address] Password  [email or web address] 09/06/2016, 2:19 PM

## 2016-09-06 NOTE — Progress Notes (Signed)
Visit made to pts room to set up Cpap.  Pt states he does not weqr Cpap and has never had a sleep study.  I looked over pts chart and didn't find any documentation on a sleep study or OSA. [name]  Pt in no distress.  Advised RN of situation.  Cpap was not placed on patient.

## 2016-09-06 NOTE — Progress Notes (Signed)
Patient B/P 197/61. Hydralazine 10 mg given. Rechecked B/P 168/44. Patient needs a CPAP. MD notified and made aware.

## 2016-09-07 ENCOUNTER — Encounter (HOSPITAL_COMMUNITY)
Admission: EM | Disposition: A | Discharge status: Rehabilitation Facility | Payer: Self-pay | Source: Home / Self Care | Attending: Pulmonary Disease

## 2016-09-07 ENCOUNTER — Encounter (HOSPITAL_COMMUNITY): Payer: Self-pay | Admitting: Cardiology

## 2016-09-07 DIAGNOSIS — I2510 Atherosclerotic heart disease of native coronary artery without angina pectoris: Secondary | ICD-10-CM

## 2016-09-07 HISTORY — PX: CARDIAC CATHETERIZATION: SHX172

## 2016-09-07 LAB — CBC
HCT: 37.8 % — ABNORMAL LOW (ref 39.0–52.0)
Hemoglobin: 11.9 g/dL — ABNORMAL LOW (ref 13.0–17.0)
MCH: 26.7 pg (ref 26.0–34.0)
MCHC: 31.5 g/dL (ref 30.0–36.0)
MCV: 84.8 fL (ref 78.0–100.0)
Platelets: 180 10*3/uL (ref 150–400)
RBC: 4.46 MIL/uL (ref 4.22–5.81)
RDW: 13.9 % (ref 11.5–15.5)
WBC: 13.3 10*3/uL — ABNORMAL HIGH (ref 4.0–10.5)

## 2016-09-07 LAB — CREATININE, SERUM
Creatinine, Ser: 1.09 mg/dL (ref 0.61–1.24)
GFR calc Af Amer: >60 mL/min (ref >60)
GFR calc non Af Amer: >60 mL/min (ref >60)

## 2016-09-07 LAB — BASIC METABOLIC PANEL
Anion gap: 12 (ref 5–15)
Anion gap: 10 (ref 5–15)
BUN: 15 mg/dL (ref 6–20)
BUN: 14 mg/dL (ref 6–20)
CO2: 25 mmol/L (ref 22–32)
CO2: 25 mmol/L (ref 22–32)
Calcium: 9.9 mg/dL (ref 8.9–10.3)
Calcium: 9.7 mg/dL (ref 8.9–10.3)
Chloride: 106 mmol/L (ref 101–111)
Chloride: 108 mmol/L (ref 101–111)
Creatinine, Ser: 1.17 mg/dL (ref 0.61–1.24)
Creatinine, Ser: 1.07 mg/dL (ref 0.61–1.24)
GFR calc Af Amer: >60 mL/min (ref >60)
GFR calc Af Amer: >60 mL/min (ref >60)
GFR calc non Af Amer: >60 mL/min (ref >60)
GFR calc non Af Amer: >60 mL/min (ref >60)
Glucose, Bld: 103 mg/dL — ABNORMAL HIGH (ref 65–99)
Glucose, Bld: 106 mg/dL — ABNORMAL HIGH (ref 65–99)
Potassium: 2.9 mmol/L — ABNORMAL LOW (ref 3.5–5.1)
Potassium: 3.4 mmol/L — ABNORMAL LOW (ref 3.5–5.1)
Sodium: 143 mmol/L (ref 135–145)
Sodium: 143 mmol/L (ref 135–145)

## 2016-09-07 LAB — GLUCOSE, CAPILLARY
Glucose-Capillary: 100 mg/dL — ABNORMAL HIGH (ref 65–99)
Glucose-Capillary: 110 mg/dL — ABNORMAL HIGH (ref 65–99)
Glucose-Capillary: 112 mg/dL — ABNORMAL HIGH (ref 65–99)
Glucose-Capillary: 115 mg/dL — ABNORMAL HIGH (ref 65–99)
Glucose-Capillary: 119 mg/dL — ABNORMAL HIGH (ref 65–99)
Glucose-Capillary: 98 mg/dL (ref 65–99)

## 2016-09-07 LAB — LIPID PANEL
Cholesterol: 159 mg/dL (ref 0–200)
HDL: 25 mg/dL — ABNORMAL LOW (ref >40)
LDL Cholesterol: 105 mg/dL — ABNORMAL HIGH (ref 0–99)
Total CHOL/HDL Ratio: 6.4 RATIO
Triglycerides: 146 mg/dL (ref <150)
VLDL: 29 mg/dL (ref 0–40)

## 2016-09-07 LAB — PROTIME-INR
INR: 1.13
Prothrombin Time: 14.6 seconds (ref 11.4–15.2)

## 2016-09-07 SURGERY — LEFT HEART CATH AND CORONARY ANGIOGRAPHY
Anesthesia: LOCAL

## 2016-09-07 MED ORDER — HEPARIN (PORCINE) IN NACL 2-0.9 UNIT/ML-% IJ SOLN
INTRAMUSCULAR | Status: AC
  Filled 2016-09-07: qty 1000

## 2016-09-07 MED ORDER — LIDOCAINE HCL (PF) 1 % IJ SOLN
INTRAMUSCULAR | Status: DC | PRN
  Administered 2016-09-07: 2 mL via INTRADERMAL

## 2016-09-07 MED ORDER — IOPAMIDOL (ISOVUE-370) INJECTION 76%
INTRAVENOUS | Status: DC | PRN
  Administered 2016-09-07: 115 mL via INTRA_ARTERIAL

## 2016-09-07 MED ORDER — SODIUM CHLORIDE 0.9% FLUSH
3.0000 mL | Freq: Two times a day (BID) | INTRAVENOUS | Status: DC
  Administered 2016-09-07 – 2016-09-14 (×12): 3 mL via INTRAVENOUS

## 2016-09-07 MED ORDER — HEPARIN SODIUM (PORCINE) 5000 UNIT/ML IJ SOLN
5000.0000 [IU] | Freq: Three times a day (TID) | INTRAMUSCULAR | Status: DC
  Administered 2016-09-07 – 2016-09-15 (×24): 5000 [IU] via SUBCUTANEOUS
  Filled 2016-09-07 (×24): qty 1

## 2016-09-07 MED ORDER — VERAPAMIL HCL 2.5 MG/ML IV SOLN
INTRAVENOUS | Status: DC | PRN
  Administered 2016-09-07: 10 mL via INTRA_ARTERIAL

## 2016-09-07 MED ORDER — VERAPAMIL HCL 2.5 MG/ML IV SOLN
INTRAVENOUS | Status: AC
  Filled 2016-09-07: qty 2

## 2016-09-07 MED ORDER — FUROSEMIDE 20 MG PO TABS
20.0000 mg | ORAL_TABLET | Freq: Every day | ORAL | Status: DC
  Administered 2016-09-08 – 2016-09-15 (×8): 20 mg via ORAL
  Filled 2016-09-07 (×8): qty 1

## 2016-09-07 MED ORDER — FENTANYL CITRATE (PF) 100 MCG/2ML IJ SOLN
INTRAMUSCULAR | Status: DC | PRN
  Administered 2016-09-07: 25 ug via INTRAVENOUS

## 2016-09-07 MED ORDER — SODIUM CHLORIDE 0.9% FLUSH: 3.0000 mL | INTRAVENOUS | Status: DC | PRN

## 2016-09-07 MED ORDER — FENTANYL CITRATE (PF) 100 MCG/2ML IJ SOLN
INTRAMUSCULAR | Status: AC
  Filled 2016-09-07: qty 2

## 2016-09-07 MED ORDER — ONDANSETRON HCL 4 MG/2ML IJ SOLN: 4.0000 mg | Freq: Four times a day (QID) | INTRAMUSCULAR | Status: DC | PRN

## 2016-09-07 MED ORDER — SODIUM CHLORIDE 0.9 % IV SOLN: 250.0000 mL | INTRAVENOUS | Status: DC | PRN

## 2016-09-07 MED ORDER — MIDAZOLAM HCL 2 MG/2ML IJ SOLN
INTRAMUSCULAR | Status: DC | PRN
  Administered 2016-09-07: 1 mg via INTRAVENOUS

## 2016-09-07 MED ORDER — POTASSIUM CHLORIDE 2 MEQ/ML IV SOLN
30.0000 meq | Freq: Once | INTRAVENOUS | Status: AC
  Administered 2016-09-07: 30 meq via INTRAVENOUS
  Filled 2016-09-07: qty 15

## 2016-09-07 MED ORDER — HEPARIN (PORCINE) IN NACL 2-0.9 UNIT/ML-% IJ SOLN
INTRAMUSCULAR | Status: DC | PRN
Start: 2016-09-07 — End: 2016-09-07
  Administered 2016-09-07: 1000 mL

## 2016-09-07 MED ORDER — POTASSIUM CHLORIDE CRYS ER 20 MEQ PO TBCR: 30.0000 meq | EXTENDED_RELEASE_TABLET | ORAL | Status: DC

## 2016-09-07 MED ORDER — AMIODARONE HCL 200 MG PO TABS
400.0000 mg | ORAL_TABLET | Freq: Two times a day (BID) | ORAL | Status: DC
  Administered 2016-09-07 – 2016-09-15 (×16): 400 mg via ORAL
  Filled 2016-09-07 (×16): qty 2

## 2016-09-07 MED ORDER — MIDAZOLAM HCL 2 MG/2ML IJ SOLN
INTRAMUSCULAR | Status: AC
  Filled 2016-09-07: qty 2

## 2016-09-07 MED ORDER — HEPARIN SODIUM (PORCINE) 1000 UNIT/ML IJ SOLN
INTRAMUSCULAR | Status: AC
  Filled 2016-09-07: qty 1

## 2016-09-07 MED ORDER — SODIUM CHLORIDE 0.9 % WEIGHT BASED INFUSION
1.0000 mL/kg/h | INTRAVENOUS | Status: AC
  Administered 2016-09-07: 1 mL/kg/h via INTRAVENOUS

## 2016-09-07 MED ORDER — HEPARIN SODIUM (PORCINE) 1000 UNIT/ML IJ SOLN
INTRAMUSCULAR | Status: DC | PRN
  Administered 2016-09-07: 5500 [IU] via INTRAVENOUS

## 2016-09-07 MED ORDER — CARVEDILOL 12.5 MG PO TABS
12.5000 mg | ORAL_TABLET | Freq: Two times a day (BID) | ORAL | Status: DC
Start: 2016-09-07 — End: 2016-09-15
  Administered 2016-09-07 – 2016-09-15 (×17): 12.5 mg via ORAL
  Filled 2016-09-07 (×17): qty 1

## 2016-09-07 MED ORDER — LIDOCAINE HCL (PF) 1 % IJ SOLN
INTRAMUSCULAR | Status: AC
  Filled 2016-09-07: qty 30

## 2016-09-07 MED ORDER — IOPAMIDOL (ISOVUE-370) INJECTION 76%
INTRAVENOUS | Status: AC
  Filled 2016-09-07: qty 100

## 2016-09-07 MED ORDER — SODIUM CHLORIDE 0.9 % IV SOLN
30.0000 meq | Freq: Once | INTRAVENOUS | Status: AC
  Administered 2016-09-07: 30 meq via INTRAVENOUS
  Filled 2016-09-07: qty 15

## 2016-09-07 MED ORDER — IOPAMIDOL (ISOVUE-370) INJECTION 76%
INTRAVENOUS | Status: AC
  Filled 2016-09-07: qty 50

## 2016-09-07 MED ORDER — POTASSIUM CHLORIDE CRYS ER 20 MEQ PO TBCR
40.0000 meq | EXTENDED_RELEASE_TABLET | Freq: Once | ORAL | Status: AC
  Administered 2016-09-07: 40 meq via ORAL
  Filled 2016-09-07: qty 2

## 2016-09-07 SURGICAL SUPPLY — 12 items
CATH EXPO 5F FL3.5 (CATHETERS) ×1 IMPLANT
CATH EXPO 5FR ANG PIGTAIL 145 (CATHETERS) ×1 IMPLANT
CATH INFINITI JR4 5F (CATHETERS) ×1 IMPLANT
DEVICE RAD COMP TR BAND LRG (VASCULAR PRODUCTS) ×1 IMPLANT
GLIDESHEATH SLEND SS 6F .021 (SHEATH) ×1 IMPLANT
GUIDEWIRE INQWIRE 1.5J.035X260 (WIRE) IMPLANT
INQWIRE 1.5J .035X260CM (WIRE) ×2
KIT HEART LEFT (KITS) ×2 IMPLANT
PACK CARDIAC CATHETERIZATION (CUSTOM PROCEDURE TRAY) ×2 IMPLANT
SYR MEDRAD MARK V 150ML (SYRINGE) ×2 IMPLANT
TRANSDUCER W/STOPCOCK (MISCELLANEOUS) ×2 IMPLANT
TUBING CIL FLEX 10 FLL-RA (TUBING) ×2 IMPLANT

## 2016-09-07 NOTE — Progress Notes (Signed)
Notified Cardiologist of 9 beat run of V.tach, pt has been asymptomatic, vitals are stable, no new orders Arsenio Loader  09-07-2016 12:28 PM

## 2016-09-07 NOTE — Progress Notes (Signed)
SLP Cancellation Note  Patient Details Name: Dean [name] MRN: [id number] DOB: Oct 27, 1950   Cancelled treatment:       Reason Eval/Treat Not Completed: Other (comment) Pt NPO this morning pending procedure. Will f/u as able.   Germain Osgood 09/07/2016, 9:24 AM  Germain Osgood, M.A. CCC-SLP [phone]

## 2016-09-07 NOTE — Progress Notes (Signed)
PROGRESS NOTE  Dean [name]  [id number] DOB: June 30, 1951  DOA: 08/27/2016 PCP: [name], MD   Brief Narrative:  65 yo MO (310 lbs 5'5") AAM who was walking back from the bathroom 12/3 when he collapsed pulseless. CPR wa not performed by family and EMS arrived in 10 minutes and performed CPR x 21 minutes, 7 rounds of epi, 5 shocks, amiodarone and magnesium. He coded again with ROSC. King airway was changed by EDP. He was taken to CT scanner with NAD of brain, transfer to CCU and cardiology was called. Hypothermia protocol was initiated at 1545 after ct head. After clinical improvement and stabilization, care transferred from [facility]CM to [facility] on 09/06/16   SIGNIFICANT EVENTS: 12/3 Cardiac arrest  12/5 > Re-Warmed, off sedation  12/7 extubated  12/8 Acutely confused overnight, pulled CVC, ativan ordered. Mittens in place  12/9 - wife and daughter at bedside - feel encephalopathy is rapidly improving. sTill sleepy but much better. They think he is ready to eat. STill per RN can slip out of bed and needs siter. No on infusion. opulse ox 97% 2L [place]. 94% on RA x 17min  Assessment & Plan:   Principal Problem:   Cardiopulmonary arrest ([place]) Active Problems:   DM2 (diabetes mellitus, type 2) ([place])   Morbid obesity ([place])   Essential hypertension, benign   Acute encephalopathy   Acute respiratory failure ([place])   Uncomplicated asthma   Diabetes mellitus type 2 in obese (HCC)   Acute on chronic combined systolic and diastolic CHF (congestive heart failure) (HCC)   Tachypnea   Hypernatremia   Hypokalemia   Leukocytosis   Acute blood loss anemia   Hypoxemia   1. Status post cardiac arrest 08/27/16 with 31 minutes down: VT/VF arrest,? Secondary to respiratory process. Cardiology following. Underwent cardiac cath 12/14 and detailed results as below. 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  Medical management recommended by cardiology. EP cardiology consulted for ICD. 2. 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 Continue IV Lasix. -[id number] ML since admission. Cardiology has switched metoprolol to carvedilol. Transition to oral Lasix possibly 12/15. Plans to add an ARB if renal function stable post cath with ultimate plan to transition to [facility] 3. Acute respiratory failure with hypoxia: Extubated 12/7. Reassess prior to discharge regarding need for home oxygen. 4. Acute kidney injury: Resolved. 5. Hypokalemia: Replaced. Magnesium normal. 6. Dysphagia: Speech therapy reevaluated and recommended dysphagia 2 diet. 7. DM type II: Continue SSI. 8. Essential hypertension: Mildly uncontrolled. Currently on NTP transdermal and beta blockers changed to carvedilol. ARB if renal functions remain stable. 9. Acute encephalopathy: Agitation/delirium versus anoxic injury. CT head negative. Avoid sedative and QT prolonging medications. Precedex discontinued 09/05/16. Improving. Neurology consulting. 10. History of asthma: Stable. Status post extubation 08/31/16. As per family at bedside on 12/14, mental status gradually improving. 11. Prolonged QTC: 508 ms on 09/02/16 and down to 473 ms on 09/04/16. 12. NSVT: Likely d/t CM. K & Mg OK. Change BB to PO. EP cardiology consulting for AICD.   DVT prophylaxis: Heparin Code Status: Full Family Communication: Discussed in detail with patient's spouse, son and brother at bedside on 12/14. Disposition Plan: DC when medically stable,? CIR.   Consultants:   CCM-signed off  Cardiology  Procedures:  12/3 ET>>12/7 12/3 left I J CVL>>12/8 2-D echo 08/28/16: Study Conclusions  - Left ventricle: The cavity size was mildly  dilated. Wall   thickness was increased in a pattern of severe LVH. Systolic   function was moderately reduced. The estimated ejection fraction   was in the range of 35% to 40%.  Akinesis of the basalinferior and   apical myocardium. Features are consistent with a pseudonormal   left ventricular filling pattern, with concomitant abnormal   relaxation and increased filling pressure (grade 2 diastolic   dysfunction). - Aortic valve: There was mild regurgitation. - Mitral valve: There was mild regurgitation. - Left atrium: The atrium was severely dilated.   Left Heart Cath and Coronary Angiography  Conclusion     There is moderate left ventricular systolic dysfunction.  LV end diastolic pressure is normal.  The left ventricular ejection fraction is 35-45% by visual estimate.  Ost RCA lesion, 90 %stenosed.  Mid RCA to Dist RCA lesion, 100 %stenosed.   1. Single vessel occlusive CAD. The distal RCA is occluded with left to right collaterals. There is a 90% ostial stenosis 2. Moderate LV dysfunction with EF 35-40%. Akinesis of the basal to mid inferior wall 3. Normal LVEDP  Plan: medical therapy. If he has significant angina we could consider PCI of the RCA but there appears to be significant scar of the inferior wall.       Antimicrobials:   None   Subjective: Seen this morning. Family at bedside. They state that his mental status is at least improved by 25-30 percent. Less confused and memory improving. As per RN, no acute issues.   Objective:  Vitals:   09/07/16 1201 09/07/16 1215 09/07/16 1330 09/07/16 1400  BP: (!) 125/42 (!) 128/113 (!) 148/59 94/79  Pulse: 79 75 71 72  Resp:      Temp:      TempSrc:      SpO2:      Weight:      Height:      T: 98.9, RR: 20, O2 Sat: 96.  Intake/Output Summary (Last 24 hours) at 09/07/16 1552 Last data filed at 09/07/16 1230  Gross per 24 hour  Intake              985 ml  Output              650 ml  Net              335 ml   Filed Weights   09/05/16 1349 09/06/16 0358 09/07/16 0601  Weight: 114.5 kg (252 lb 6.4 oz) 117.5 kg (259 lb) 114.9 kg (253 lb 6.4 oz)    Examination:  General  exam: Pleasant middle-aged male sitting up comfortably in bed. Respiratory system: Diminished breath sounds in the bases with few basal crackles. Rest of lung fields clear to auscultation. Respiratory effort normal. Cardiovascular system: S1 & S2 heard, RRR. No JVD, murmurs, rubs, gallops or clicks. No pedal edema. telemetry: Sinus rhythm, PVCs, BBB morphology. Intermittent episodes of NSVT.  Gastrointestinal system: Abdomen is nondistended, soft and nontender. No organomegaly or masses felt. Normal bowel sounds heard. Central nervous system: Alert and oriented to person and place only . No focal neurological deficits. Extremities: Symmetric 5 x 5 power. Skin: No rashes, lesions or ulcers Psychiatry: Judgement and insight impaired . Mood & affect appropriate.     Data Reviewed: I have personally reviewed following labs and imaging studies  CBC:  Recent Labs Lab 09/01/16 0248 09/02/16 0541 09/05/16 0255 09/06/16 0431 09/07/16 1155  WBC 9.5 12.1* 11.3* 14.3* 13.3*  HGB 10.6* 12.1* 12.3* 13.0 11.9*  HCT 34.0* 39.3 39.4 41.5 37.8*  MCV 85.9 86.2 85.1 84.7 84.8  PLT 209 232 243 227 [id number]   Basic Metabolic Panel:  Recent Labs Lab 09/01/16 0248 09/02/16 0541 09/05/16 0255 09/06/16 0431 09/06/16 1042 09/07/16 0227 09/07/16 0844 09/07/16 1155  NA 149* 148* 144 145  --  143 143  --   K 3.6 3.5 3.2* 4.0  --  2.9* 3.4*  --   CL 110 109 106 112*  --  106 108  --   CO2 30 27 27  18*  --  25 25  --   GLUCOSE 105* 115* 103* 95  --  103* 106*  --   BUN 17 16 21* 19  --  15 14  --   CREATININE 1.10 1.02 1.14 1.05  --  1.17 1.07 1.09  CALCIUM 9.9 10.3 10.0 10.0  --  9.9 9.7  --   MG 2.2 2.2 2.3  --  2.0  --   --   --   PHOS 3.8 2.9 2.7 2.4*  --   --   --   --    GFR: Estimated Creatinine Clearance: 79.4 mL/min (by C-G formula based on SCr of 1.09 mg/dL). Liver Function Tests:  Recent Labs Lab 09/01/16 1019  AST 68*  ALT 62  ALKPHOS 83  BILITOT 3.7*  PROT 7.5  ALBUMIN 3.3*    No results for input(s): LIPASE, AMYLASE in the last 168 hours.  Recent Labs Lab 09/01/16 1019  AMMONIA 26   Coagulation Profile:  Recent Labs Lab 09/07/16 0710  INR 1.13   Cardiac Enzymes: No results for input(s): CKTOTAL, CKMB, CKMBINDEX, TROPONINI in the last 168 hours. BNP (last 3 results) No results for input(s): PROBNP in the last 8760 hours. HbA1C: No results for input(s): HGBA1C in the last 72 hours. CBG:  Recent Labs Lab 09/06/16 2015 09/07/16 0034 09/07/16 0544 09/07/16 0949 09/07/16 1146  GLUCAP 147* 98 110* 100* 115*   Lipid Profile:  Recent Labs  09/07/16 0844  CHOL 159  HDL 25*  LDLCALC 105*  TRIG 146  CHOLHDL 6.4   Thyroid Function Tests: No results for input(s): TSH, T4TOTAL, FREET4, T3FREE, THYROIDAB in the last 72 hours. Anemia Panel: No results for input(s): VITAMINB12, FOLATE, FERRITIN, TIBC, IRON, RETICCTPCT in the last 72 hours.  Sepsis Labs: No results for input(s): PROCALCITON, LATICACIDVEN in the last 168 hours.  No results found for this or any previous visit (from the past 240 hour(s)).       Radiology Studies: No results found.      Scheduled Meds: . carvedilol  12.5 mg Oral BID WC  . chlorhexidine  15 mL Mouth Rinse BID  . docusate  100 mg Oral Daily  . famotidine  20 mg Oral BID  . furosemide  20 mg Intravenous Q12H  . heparin  5,000 Units Subcutaneous Q8H  . insulin aspart  0-9 Units Subcutaneous TID WC  . mouth rinse  15 mL Mouth Rinse q12n4p  . nitroGLYCERIN  1 inch Topical Q6H  . sodium chloride flush  3 mL Intravenous Q12H   Continuous Infusions: . sodium chloride 10 mL/hr at 09/07/16 1530  . sodium chloride 1 mL/kg/hr (09/07/16 1203)     LOS: 11 days       Delancey Moraes, MD [facility] Pager [phone]  If 7PM-7AM, please contact night-coverage [email or web address] Password [email or web address] 09/07/2016, 3:52 PM

## 2016-09-07 NOTE — H&P (View-Only) (Signed)
Patient Name: Dean [name] Date of Encounter: 09/06/2016  Primary Cardiologist: Dr. [place] Hospital Problem List     Principal Problem:   Cardiopulmonary arrest [facility]) Active Problems:   DM2 (diabetes mellitus, type 2) ([place])   Morbid obesity ([place])   Essential hypertension, benign   Acute encephalopathy   Acute respiratory failure ([place])   Uncomplicated asthma   Diabetes mellitus type 2 in obese (HCC)   Acute on chronic combined systolic and diastolic CHF (congestive heart failure) (HCC)   Tachypnea   Hypernatremia   Hypokalemia   Leukocytosis   Acute blood loss anemia   Hypoxemia     Subjective   Answering questions.  No chest pain.  Feels ok.  Passed swallow study with some restrictions.  Able to swallow pills.  Inpatient Medications    Scheduled Meds: . [START ON 09/07/2016] aspirin  81 mg Oral Pre-Cath  . chlorhexidine  15 mL Mouth Rinse BID  . [START ON 09/07/2016] docusate  100 mg Oral Daily  . famotidine  20 mg Oral BID  . furosemide  20 mg Intravenous Q12H  . heparin  5,000 Units Subcutaneous Q8H  . insulin aspart  0-9 Units Subcutaneous TID WC  . mouth rinse  15 mL Mouth Rinse q12n4p  . metoprolol tartrate  25 mg Oral BID  . nitroGLYCERIN  1 inch Topical Q6H  . sodium chloride flush  3 mL Intravenous Q12H   Continuous Infusions: . sodium chloride Stopped (08/31/16 2300)  . sodium chloride Stopped (09/06/16 1716)   PRN Meds: sodium chloride, sodium chloride, acetaminophen (TYLENOL) oral liquid 160 mg/5 mL, hydrALAZINE, RESOURCE THICKENUP CLEAR, sodium chloride flush, sodium chloride flush   Vital Signs    Vitals:   09/05/16 2240 09/06/16 0058 09/06/16 0358 09/06/16 1211  BP: (!) 168/44 (!) 159/52 (!) 153/58 (!) 162/64  Pulse:  63 96 82  Resp:  20 20 18   Temp:  97.1 F (36.2 C) 98.8 F (37.1 C) 98 F (36.7 C)  TempSrc:  Oral Oral Oral  SpO2:  98% 99% 99%  Weight:   259 lb (117.5 kg)   Height:        Intake/Output Summary (Last 24  hours) at 09/06/16 1746 Last data filed at 09/06/16 1641  Gross per 24 hour  Intake              720 ml  Output             1320 ml  Net             -600 ml   Filed Weights   09/04/16 0300 09/05/16 1349 09/06/16 0358  Weight: 258 lb 9.6 oz (117.3 kg) 252 lb 6.4 oz (114.5 kg) 259 lb (117.5 kg)    Physical Exam    GEN: Resting comfortably.  HEENT: Grossly normal.  Neck:  no JVD,  Cardiac: RRR, no murmurs, rubs, or gallops. No clubbing, cyanosis, edema.  Radials/DP/PT 2+ and equal bilaterally.  Respiratory:  Respirations regular and unlabored, clear to auscultation bilaterally. GI: Soft, nontender, nondistended, BS + x 4. MS: no deformity or atrophy. Skin: warm and dry, no rash. Neuro:  Strength and sensation are intact. Psych: Answering questions appropriately  Labs    CBC  Recent Labs  09/05/16 0255 09/06/16 0431  WBC 11.3* 14.3*  HGB 12.3* 13.0  HCT 39.4 41.5  MCV 85.1 84.7  PLT 243 [id number]   Basic Metabolic Panel  Recent Labs  09/05/16 0255 09/06/16 0431 09/06/16 1042  NA  144 145  --   K 3.2* 4.0  --   CL 106 112*  --   CO2 27 18*  --   GLUCOSE 103* 95  --   BUN 21* 19  --   CREATININE 1.14 1.05  --   CALCIUM 10.0 10.0  --   MG 2.3  --  2.0  PHOS 2.7 2.4*  --    Liver Function Tests No results for input(s): AST, ALT, ALKPHOS, BILITOT, PROT, ALBUMIN in the last 72 hours. No results for input(s): LIPASE, AMYLASE in the last 72 hours. Cardiac Enzymes No results for input(s): CKTOTAL, CKMB, CKMBINDEX, TROPONINI in the last 72 hours. BNP Invalid input(s): POCBNP D-Dimer No results for input(s): DDIMER in the last 72 hours. Hemoglobin A1C No results for input(s): HGBA1C in the last 72 hours. Fasting Lipid Panel No results for input(s): CHOL, HDL, LDLCALC, TRIG, CHOLHDL, LDLDIRECT in the last 72 hours. Thyroid Function Tests No results for input(s): TSH, T4TOTAL, T3FREE, THYROIDAB in the last 72 hours.  Invalid input(s): FREET3  Telemetry    NSR,  PVCs, couplets - Personally Reviewed  ECG    NSR, IRBBB, NSST  Radiology    No results found.  Cardiac Studies   EF 35-40%  Patient Profile     65 y/o with cardiac arrest  Assessment & Plan    1) Cardiac arrest.  Mental status improved.  Plan for cardiac cath tomorrow.  Patient and wife are agreeable. He has had some NSVT.  All questions answered.  EF decreased.  Does not appear to be in heart failure. Will need ACE-I and beta blocker as BP and renal function allow.   HTN: BP controlled.   Signed, Larae Grooms, MD  09/06/2016, 5:46 PM

## 2016-09-07 NOTE — Interval H&P Note (Signed)
History and Physical Interval Note:  09/07/2016 10:23 AM  Dean [name]  has presented today for surgery, with the diagnosis of cardiac arrest  The various methods of treatment have been discussed with the patient and family. After consideration of risks, benefits and other options for treatment, the patient has consented to  Procedure(s): Left Heart Cath and Coronary Angiography (N/A) as a surgical intervention .  The patient's history has been reviewed, patient examined, no change in status, stable for surgery.  I have reviewed the patient's chart and labs.  Questions were answered to the patient's satisfaction.   Cath Lab Visit (complete for each Cath Lab visit)  Clinical Evaluation Leading to the Procedure:   ACS: No.  Non-ACS:    Anginal Classification: CCS II  Anti-ischemic medical therapy: Minimal Therapy (1 class of medications)  Non-Invasive Test Results: High-risk stress test findings: cardiac mortality >3%/year  Prior CABG: No previous CABG        [name] 09/07/2016 10:23 AM

## 2016-09-07 NOTE — Progress Notes (Signed)
Pt with 9 beat run of V.tach at 1150am, patient is asymptomatic, MD Hongali notified Neta Mends RN 12:01 PM 09-07-2016

## 2016-09-07 NOTE — Progress Notes (Signed)
Paged NP Sharolyn Douglas concerning patient's run of V.tach at 1309 this afternoon [facility] N 1:30 PM 09-07-2016

## 2016-09-07 NOTE — Progress Notes (Signed)
Patient Name: Dean [name] Date of Encounter: 09/07/2016  Primary Cardiologist: Liane Comber, MD   Hospital Problem List     Principal Problem:   Cardiopulmonary arrest [facility]) Active Problems:   DM2 (diabetes mellitus, type 2) ([place])   Morbid obesity ([place])   Essential hypertension, benign   Acute encephalopathy   Acute respiratory failure ([place])   Uncomplicated asthma   Diabetes mellitus type 2 in obese ([place])   Acute on chronic combined systolic and diastolic CHF (congestive heart failure) (HCC)   Tachypnea   Hypernatremia   Hypokalemia   Leukocytosis   Acute blood loss anemia   Hypoxemia   Subjective   Nurse reported confusion this AM. Currently alert and oriented.  Understands plans for today - cath.  K low - getting IV KCl.  Has had some pleuritic c/p w/ cough.  No dyspnea.  Inpatient Medications    . chlorhexidine  15 mL Mouth Rinse BID  . docusate  100 mg Oral Daily  . famotidine  20 mg Oral BID  . furosemide  20 mg Intravenous Q12H  . heparin  5,000 Units Subcutaneous Q8H  . insulin aspart  0-9 Units Subcutaneous TID WC  . mouth rinse  15 mL Mouth Rinse q12n4p  . metoprolol tartrate  25 mg Oral BID  . nitroGLYCERIN  1 inch Topical Q6H  . potassium chloride  30 mEq Oral Q4H  . potassium chloride (KCL MULTIRUN) 30 mEq in 265 mL IVPB  30 mEq Intravenous Once  . sodium chloride flush  3 mL Intravenous Q12H    Vital Signs    Vitals:   09/06/16 1910 09/06/16 1937 09/06/16 2351 09/07/16 0601  BP: (!) 171/97 (!) 188/70 (!) 162/64 (!) 148/55  Pulse: (!) 101 (!) 101 90   Resp:  20 20 20   Temp:  98 F (36.7 C) 98.3 F (36.8 C) 98.7 F (37.1 C)  TempSrc:  Oral Oral Oral  SpO2:  100% 97% 95%  Weight:    253 lb 6.4 oz (114.9 kg)  Height:        Intake/Output Summary (Last 24 hours) at 09/07/16 0806 Last data filed at 09/07/16 0804  Gross per 24 hour  Intake              985 ml  Output             1220 ml  Net             -235 ml   Filed Weights   09/05/16 1349 09/06/16 0358 09/07/16 0601  Weight: 252 lb 6.4 oz (114.5 kg) 259 lb (117.5 kg) 253 lb 6.4 oz (114.9 kg)    Physical Exam   GEN: Well nourished, well developed, in no acute distress.  HEENT: Grossly normal.  Neck: Supple, obese, difficult to gauge jvp.  No carotid bruits, or masses. Cardiac: RRR, no murmurs, rubs, or gallops. No clubbing, cyanosis, edema.  Radials/DP/PT 2+ and equal bilaterally.  Respiratory:  Respirations regular and unlabored, scattered rhonchi. GI: Soft, nontender, nondistended, BS + x 4. MS: no deformity or atrophy. Skin: warm and dry, no rash. Neuro:  Strength and sensation are intact. Psych: AAOx3.  Normal affect.  Labs    CBC  Recent Labs  09/05/16 0255 09/06/16 0431  WBC 11.3* 14.3*  HGB 12.3* 13.0  HCT 39.4 41.5  MCV 85.1 84.7  PLT 243 [id number]   Basic Metabolic Panel  Recent Labs  09/05/16 0255 09/06/16 0431 09/06/16 1042 09/07/16 0227  NA 144  145  --  143  K 3.2* 4.0  --  2.9*  CL 106 112*  --  106  CO2 27 18*  --  25  GLUCOSE 103* 95  --  103*  BUN 21* 19  --  15  CREATININE 1.14 1.05  --  1.17  CALCIUM 10.0 10.0  --  9.9  MG 2.3  --  2.0  --   PHOS 2.7 2.4*  --   --     Telemetry    RSR, freq pvc's, 9 and 16 beat runs of NSVT yesterday afternoon.  Radiology    No results found.  2D Echocardiogram 12.4.2017  Study Conclusions   - Left ventricle: The cavity size was mildly dilated. Wall   thickness was increased in a pattern of severe LVH. Systolic   function was moderately reduced. The estimated ejection fraction   was in the range of 35% to 40%. Akinesis of the basalinferior and   apical myocardium. Features are consistent with a pseudonormal   left ventricular filling pattern, with concomitant abnormal   relaxation and increased filling pressure (grade 2 diastolic   dysfunction). - Aortic valve: There was mild regurgitation. - Mitral valve: There was mild regurgitation. - Left atrium: The atrium was  severely dilated.  Patient Profile     This is a 65 y.o.malewith a history of type 2 diabetes, asthma and hypertension and presented after a witnessed cardiac arrest.  He had prolonged CPR.  He has chronic LBBB.  EF 35-40%.  Assessment & Plan    1.  Status post cardiac arrest:  Admitted 12/3 following cardiac arrest.  Plan for cath today.  Presumed CAD based on echo and wma.  The patient understands that risks include but are not limited to stroke (1 in 1000), death (1 in 34), kidney failure [usually temporary] (1 in 500), bleeding (1 in 200), allergic reaction [possibly serious] (1 in 200), and agrees to proceed.  Cont  blocker. Add ASA.  F/u LFT's with plan to add high potency statin if CAD present.  With arrest, freq pvc's and up to 16 beats of NSVT, will need to consider lifevest.  2. Acute systolic CHF/presumed ICM:  EF 35-40%.  Remains on low dose IV lasix.  Minus 4.5L for admission.  Wt down from 264 lbs to 253 lbs.  Renal fxn stable.  Exam difficult due to body habitus but no overt evidence of volume overload.  Cont  blocker (change to coreg)  Convert lasix to PO.  Was previously on lisinopril, hydralazine, and coreg until 12/11.  Plan to add ARB if renal fxn stable post-cath with ultimate plan to transition to entresto (will need case mgmt to eval).    3.  Essential HTN:  BP has been elevated.  Will switch metoprolol to coreg.  Plan to add ARB if renal fxn stable post-cath  eventually entresto.  4.  Lipids:  Status unknown.  F/u fasting lipids in am.  Plan to add statin if CAD present.  5.  Hypokalemia:  Supp.  6. NSVT:  Up to 16 beats of NSVT yesterday.  Titrate  blocker.  Supp K.  Signed, Murray Hodgkins NP 09/07/2016, 8:06 AM   I have examined the patient and reviewed assessment and plan and discussed with patient.  Agree with above as stated.  Appears stable for cath.  Procedure explained to the patient and family. All questions were answered.  Larae Grooms

## 2016-09-07 NOTE — Consult Note (Signed)
ELECTROPHYSIOLOGY CONSULT NOTE    Patient ID: Dean [name] MRN: [id number], DOB/AGE: June 06, 1951 65 y.o.  Admit date: 08/27/2016 Date of Consult: 09/07/2016  Primary Physician: [name], MD Primary Cardiologist: new to [facility], Dr. Debara Pickett  Reason for Consultation: NSVT  HPI: Dean [name] is a 65 y.o. male with PMHx of obesity, DM II, and HTN, admitted to [facility] 08/27/16 after a witness cardiac arrest at home, chart record notes family was unable to perform CPR however he did receive about 30 minutes of CPR when EMS arrived. He was shocked 5 times and received 450 mg of amiodarone as well as 2 g of magnesium and 7 injections of epinephrine.   This ultimately resulted in ROSC. 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 Point-of-care troponin was 0.07. Venous lactate was elevated at 5.88, and white blood cell count was 22,100. Creatinine 1.31. The patient underwent hypothermia protocol, rewarmed and off pressor support 08/30/16, diuresed for CHF and with HTN initiated on BB.  Extubated 08/31/16.  He remained intermittently confused/aggitated requiring sedation though improved eventually felt ready for cath which he had done today distal RCA is occluded with left to right collaterals. There is a 90% ostial stenosis, no intervention. Planned for medical therapy. If he has significant angina we could consider PCI of the RCA but there appears to be significant scar of the inferior wall.  The patient has no recollection of the days leading to the cardiac arrest.  The wife at bedside fills in the HPI, states that for a few days he kept telling her he could hear his heartbeat in his ears, and would ask her to listen with her head on his chest, stating if felt "wierd", but no pain.  He happened to have had a planned annual visot at his PMD office 5 days prior, mentioned feeling something was "off", though nothing unusual was reported, he got his flu shot and went  home.  He mentioned to his wife a few times something didn't feel right but was not more specific.  Notes mention SOB, but the wife says she had not noted that.  Again, the patient doesn't recall.  Today he reports feeling tired and a little congested ion his chest with a slight cough, he has chest wall tenderness, no CP otherwise, no palpitations.  He denies hx of syncope or near syncope.  LVEF by TTE was 35-40% LVEF by LV gram the same 35-40%  No historical studies.  LABS today K+ 2.9 >> given replacement BUN/Creat 15/1.17  09/06/16  Mag 2.0 K+ 4.0 H/H 13/41/ WBC 14.3 plts 227  Past Medical History:  Diagnosis Date  . Arthritis    "maybe in his spine" (09/05/2016)  . Asthma   . Cardiac arrest ([place]) 08/27/2016   REQUIRING CPR, SHOCK & MEDICATIONS  . Gout   . High cholesterol   . Hypertension   . OSA on CPAP   . Type II diabetes mellitus ([place])      Surgical History:  Past Surgical History:  Procedure Laterality Date  . CARDIAC CATHETERIZATION N/A 09/07/2016   Procedure: Left Heart Cath and Coronary Angiography;  Surgeon: Peter M Martinique, MD;  Location: [facility];  Service: Cardiovascular;  Laterality: N/A;  . NO PAST SURGERIES       Prescriptions Prior to Admission  Medication Sig Dispense Refill Last Dose  . allopurinol (ZYLOPRIM) 300 MG tablet Take 300 mg by mouth daily.   08/26/2016 at Unknown time  .  amLODipine (NORVASC) 10 MG tablet Take 10 mg by mouth daily.   08/26/2016 at Unknown time  . cloNIDine (CATAPRES) 0.2 MG tablet Take 0.2 mg by mouth daily.   08/26/2016 at Unknown time  . CVS VITAMIN D3 1000 units capsule Take 1,000 Units by mouth daily.  5 08/26/2016 at Unknown time  . furosemide (LASIX) 40 MG tablet Take 40 mg by mouth daily.   08/26/2016 at Unknown time  . Ipratropium-Albuterol (COMBIVENT RESPIMAT) 20-100 MCG/ACT AERS respimat Inhale 1 puff into the lungs every 6 (six) hours as needed for wheezing or shortness of breath.   1-2 weeks ago  .  isosorbide-hydrALAZINE (BIDIL) 20-37.5 MG tablet Take 1 tablet by mouth 3 (three) times daily.   08/26/2016 at Unknown time  . metFORMIN (GLUCOPHAGE) 500 MG tablet Take 500 mg by mouth 2 (two) times daily with a meal.    08/26/2016 at Unknown time  . metoprolol (TOPROL-XL) 200 MG 24 hr tablet Take 300 mg by mouth daily.   08/26/2016 at Unknown time  . PRESCRIPTION MEDICATION Inhale into the lungs at bedtime. CPAP   couple nights ago  . rosuvastatin (CRESTOR) 20 MG tablet Take 20 mg by mouth daily.   08/26/2016 at Unknown time  . spironolactone (ALDACTONE) 50 MG tablet Take 50 mg by mouth daily.   08/26/2016 at Unknown time    Inpatient Medications:  . carvedilol  12.5 mg Oral BID WC  . chlorhexidine  15 mL Mouth Rinse BID  . docusate  100 mg Oral Daily  . famotidine  20 mg Oral BID  . furosemide  20 mg Intravenous Q12H  . heparin  5,000 Units Subcutaneous Q8H  . insulin aspart  0-9 Units Subcutaneous TID WC  . mouth rinse  15 mL Mouth Rinse q12n4p  . nitroGLYCERIN  1 inch Topical Q6H  . sodium chloride flush  3 mL Intravenous Q12H    Allergies: No Known Allergies  Social History   Social History  . Marital status: Married    Spouse name: N/A  . Number of children: N/A  . Years of education: N/A   Occupational History  . Not on file.   Social History Main Topics  . Smoking status: Never Smoker  . Smokeless tobacco: Never Used  . Alcohol use No  . Drug use: No  . Sexual activity: Not on file   Other Topics Concern  . Not on file   Social History Narrative  . No narrative on file     Family History  Problem Relation Age of Onset  . Heart attack Father     died in his 54's  . Hypertension Sister   . Cancer Brother     uncertain, "leg"  . Hypertension Sister      Review of Systems: All other systems reviewed and are otherwise negative except as noted above.  Physical Exam: Vitals:   09/07/16 1113 09/07/16 1118 09/07/16 1123 09/07/16 1147  BP:    (!) 118/37    Pulse: 87 (!) 0 (!) 0 79  Resp: (!) 24 (!) 0 (!) 0 20  Temp:    98.9 F (37.2 C)  TempSrc:    Oral  SpO2: 91% (!) 0% (!) 0% 96%  Weight:      Height:        GEN- The patient is ill appearing, alert and oriented x 3 today.  (though confused on events that brought him to the hospital) HEENT: normocephalic, atraumatic; sclera clear, conjunctiva pink; hearing intact;  oropharynx clear; neck supple, no JVP Lymph- neck is obese Lungs- soft scattered ronchi, normal work of breathing.  No wheezes, rales, rhonchi Heart- Regular rate and rhythm, no murmurs, rubs or gallops GI- soft, non-tender, non-distended Extremities- no clubbing, cyanosis, or edema MS- no significant deformity or atrophy Skin- warm and dry, no rash or lesion Psych- euthymic mood, full affect Neuro- no gross deficits observed, hard to keep on topic  Labs:   Lab Results  Component Value Date   WBC 13.3 (H) 09/07/2016   HGB 11.9 (L) 09/07/2016   HCT 37.8 (L) 09/07/2016   MCV 84.8 09/07/2016   PLT 180 09/07/2016    Recent Labs Lab 09/01/16 1019  09/07/16 0844 09/07/16 1155  NA  --   < > 143  --   K  --   < > 3.4*  --   CL  --   < > 108  --   CO2  --   < > 25  --   BUN  --   < > 14  --   CREATININE  --   < > 1.07 1.09  CALCIUM  --   < > 9.7  --   PROT 7.5  --   --   --   BILITOT 3.7*  --   --   --   ALKPHOS 83  --   --   --   ALT 62  --   --   --   AST 68*  --   --   --   GLUCOSE  --   < > 106*  --   < > = values in this interval not displayed.    Radiology/Studies:  Result Date: 08/27/2016 CLINICAL DATA:  Cardiac arrest. EXAM: CT ANGIOGRAPHY CHEST CT ABDOMEN AND PELVIS WITH CONTRAST TECHNIQUE: Multidetector CT imaging of the chest was performed using the standard protocol during bolus administration of intravenous contrast. Multiplanar CT image reconstructions and MIPs were obtained to evaluate the vascular anatomy. Multidetector CT imaging of the abdomen and pelvis was performed using the standard  protocol during bolus administration of intravenous contrast. CONTRAST:  100 cc Isovue 370. COMPARISON:  Report of MR abdomen 03/03/2003 and CT abdomen 03/02/2003 are reviewed. Images are not available. FINDINGS: CTA CHEST FINDINGS Cardiovascular: Image quality is degraded by respiratory motion. No large central pulmonary embolus. Lobar, segmental and subsegmental pulmonary arteries cannot be evaluate. Atherosclerotic calcification of the arterial vasculature, including coronary arteries. Pulmonary arteries and heart are enlarged. No pericardial effusion. Mediastinum/Nodes: Mediastinal lymph nodes measure up to 11 mm in the low right paratracheal and prevascular regions. No definite hilar lymph nodes. Axillary lymph nodes are incompletely imaged and measure up to highly 13 mm. Nasogastric tube is seen within the esophagus, terminating in the stomach. Lungs/Pleura: Image quality is degraded by respiratory motion. Endotracheal tube terminates in the right mainstem bronchus. Basilar dependent patchy airspace consolidation. No definite pleural fluid. Musculoskeletal: No worrisome lytic or sclerotic lesions. Degenerative changes are seen in the spine. Flowing anterior osteophytosis in the thoracic spine. Review of the MIP images confirms the above findings. CT ABDOMEN and PELVIS FINDINGS Hepatobiliary: A 4.9 x 7.2 cm low-attenuation lesion in the dome of the liver appears to have contrast puddling but is not imaged on the delayed nephrographic phase series. Mildly heterogeneous low-attenuation lesion in the left hepatic lobe measures 5.1 x 5.5 cm. Multiple lesions characterized hemangiomas were reported on examination of 03/03/2003. Images are not available at the time dictation. Today's measurements are  larger than on that report. Additional smaller lesions are seen in the caudate and inferior right hepatic lobe. Gallbladder is unremarkable. No biliary ductal dilatation. Pancreas: Negative. Spleen: Negative.  Adrenals/Urinary Tract: Adrenal glands are unremarkable. Sub cm low-attenuation lesion in the right kidney is too small to characterize. Ureters are decompressed. Foley catheter and air are seen in a decompressed bladder. Bladder wall appears thickened. Stomach/Bowel: Nasogastric terminates in the stomach. Stomach, small bowel, appendix and colon are unremarkable. Vascular/Lymphatic: Atherosclerotic calcification of the arterial vasculature without abdominal aortic aneurysm. Portacaval lymph node measures 1.5 cm. Reproductive: Prostate is visualized and contains calcifications. Other: No free fluid. Tiny periumbilical hernia contains fat. Mesenteries and peritoneum are unremarkable. Musculoskeletal: No worrisome lytic or sclerotic lesions. Degenerative changes are seen in the spine and hips. IMPRESSION: 1. Poor assessment of the lobar, segmental and subsegmental pulmonary arteries due to excessive respiratory motion. No definite central pulmonary embolus. 2. Dependent pulmonary parenchymal collapse/consolidation bilaterally. Aspiration and pneumonia are not excluded. 3. Hepatic lesions, previously characterized as hemangiomas on 03/03/2003. Images are not available for comparison. Some lesions measure larger on today's study. If further assessment is desired, MR abdomen without and with contrast is recommended, when the patient is clinically stable and able to hold his breath, preferably as an outpatient. 4. Bladder wall appears thickened. 5.  Aortic atherosclerosis (ICD10-170.0). Electronically Signed: By: Lorin Picket M.D. On: 08/27/2016 16:37   Dg Chest Port 1 View Result Date: 09/04/2016 CLINICAL DATA:  Chronic respiratory failure with hypoxia EXAM: PORTABLE CHEST 1 VIEW COMPARISON:  Chest x-ray of August 31, 2016 FINDINGS: The trachea and esophagus have been extubated. The left internal jugular venous catheter has been removed. The right lung is adequately inflated. On the left there is volume loss with  obscuration of the hemidiaphragm and portions of the left heart border. The cardiac silhouette remains enlarged. The pulmonary vascularity remains engorged. The trachea is midline. The bony thorax exhibits no acute abnormality. There is calcification in the wall of the aortic arch. IMPRESSION: Improved appearance of the right lung with decreased interstitial edema. Persistent left basilar atelectasis or pneumonia with small left pleural effusion. Stable cardiomegaly. Thoracic aortic atherosclerosis. Electronically Signed   By: David  Martinique M.D.   On: 09/04/2016 07:13     EKG:SR, QS V1, nonspecific IVCD TELEMETRY: SR, occ PVC's, multifocal, infrequent couplet, 3 beat NSVT, one-8 beat NSVT yesterday, today had 55beats at slower rate, irregular   TTE: 08/28/2016 - Left ventricle: The cavity size was mildly dilated. Wall thickness was increased in a pattern of severe LVH. Systolic function was moderately reduced. The estimated ejection fraction was in the range of 35% to 40%. Akinesis of the basalinferior and apical myocardium. Features are consistent with a pseudonormal left ventricular filling pattern, with concomitant abnormal relaxation and increased filling pressure (grade 2 diastolic dysfunction). - Aortic valve: There was mild regurgitation. - Mitral valve: There was mild regurgitation. - Left atrium: The atrium was severely dilated.  09/07/16: LHC Conclusion    There is moderate left ventricular systolic dysfunction.  LV end diastolic pressure is normal.  The left ventricular ejection fraction is 35-45% by visual estimate.  Ost RCA lesion, 90 %stenosed.  Mid RCA to Dist RCA lesion, 100 %stenosed.   1. Single vessel occlusive CAD. The distal RCA is occluded with left to right collaterals. There is a 90% ostial stenosis 2. Moderate LV dysfunction with EF 35-40%. Akinesis of the basal to mid inferior wall 3. Normal LVEDP  Plan: medical therapy. If he  has  significant angina we could consider PCI of the RCA but there appears to be significant scar of the inferior wall.      Assessment and Plan:   1. Witnessed cardiac arrest, prolonged down time of at least 2minutes     S/p hypothermia protocol     Initially treted with 450 mg of amiodarone as well as 2 g of magnesium and 7 injections of epinephrine     LVEF 35-40% by echo and LVgram     NSVT Currently on coreg, no AAD The patient prior to this described as very active, hunting, working and hopes to return to these activities.   Given arrest, discussed ICD implant as secondary prevention.  The patient is in some degree of denial or at least confused about event, states he still doesn't understand why/what happened, is hard to keep on topic asking a couple times during our visit if I know his PMD, mentioned a few times he doesn't know what happened to him while we are discussing the event.  Javaun Dimperio discuss further with MD.  2. CAD     No intervention of 100% RCA     Management with primary cardiology  3. Hypokalemia      Replaced today     Keep K+ >/= 4.0      4. CHF     Cumulative negative - 4749ml     C/w primary cardiology team  5. HTN, the patient's wife reports his BP has never been able to get controlled     Severe LVH on his echo    Stable on current regime   Signed, [name], PA-C 09/07/2016 2:50 PM   I have seen and examined this patient with [name].  Agree with above, note added to reflect my findings.  On exam, regular rhythm, no murmurs, lungs clear. Presented to the hospital after out of hospital arrest and being down for 30 minutes. Remains confused but improving over the last few days.  Cath shows chronically occluded RCA. Plan for ICD placement, likely Monday.  Renley Gutman make him NPO after midnight Sunday night.  Dushawn Pusey also start him on amiodarone for his episodes of NSVT.  He may be able to come off of this down the road if he remains stable.    Hezekiah Veltre M. Zackerie Sara  MD 09/07/2016 4:35 PM

## 2016-09-07 NOTE — Progress Notes (Signed)
Patient refused CPAP use. RT informed him to let RN know if he changes his mind at any time.

## 2016-09-08 DIAGNOSIS — I25118 Atherosclerotic heart disease of native coronary artery with other forms of angina pectoris: Secondary | ICD-10-CM

## 2016-09-08 DIAGNOSIS — I472 Ventricular tachycardia: Principal | ICD-10-CM

## 2016-09-08 DIAGNOSIS — I4729 Other ventricular tachycardia: Secondary | ICD-10-CM

## 2016-09-08 LAB — COMPREHENSIVE METABOLIC PANEL
ALT: 101 U/L — ABNORMAL HIGH (ref 17–63)
AST: 59 U/L — ABNORMAL HIGH (ref 15–41)
Albumin: 3 g/dL — ABNORMAL LOW (ref 3.5–5.0)
Alkaline Phosphatase: 88 U/L (ref 38–126)
Anion gap: 13 (ref 5–15)
BUN: 14 mg/dL (ref 6–20)
CO2: 22 mmol/L (ref 22–32)
Calcium: 9.7 mg/dL (ref 8.9–10.3)
Chloride: 106 mmol/L (ref 101–111)
Creatinine, Ser: 1.12 mg/dL (ref 0.61–1.24)
GFR calc Af Amer: >60 mL/min (ref >60)
GFR calc non Af Amer: >60 mL/min (ref >60)
Glucose, Bld: 93 mg/dL (ref 65–99)
Potassium: 4.2 mmol/L (ref 3.5–5.1)
Sodium: 141 mmol/L (ref 135–145)
Total Bilirubin: 1.8 mg/dL — ABNORMAL HIGH (ref 0.3–1.2)
Total Protein: 6.9 g/dL (ref 6.5–8.1)

## 2016-09-08 LAB — GLUCOSE, CAPILLARY
Glucose-Capillary: 125 mg/dL — ABNORMAL HIGH (ref 65–99)
Glucose-Capillary: 138 mg/dL — ABNORMAL HIGH (ref 65–99)
Glucose-Capillary: 110 mg/dL — ABNORMAL HIGH (ref 65–99)
Glucose-Capillary: 165 mg/dL — ABNORMAL HIGH (ref 65–99)

## 2016-09-08 MED ORDER — ACETAMINOPHEN 325 MG PO TABS
650.0000 mg | ORAL_TABLET | ORAL | Status: DC | PRN
  Administered 2016-09-08 – 2016-09-09 (×2): 650 mg via ORAL
  Filled 2016-09-08 (×2): qty 2

## 2016-09-08 NOTE — Progress Notes (Signed)
Rehab admissions - Noted fatigue and lethargy.  Noted plans for ICD on Monday.  Not medically ready for inpatient rehab admission at this time.  I will follow up on Monday.  Call me for questions.  RC:[id number]

## 2016-09-08 NOTE — Clinical Social Work Note (Signed)
Clinical Social Work Assessment  Patient Details  Name: Dean [name] MRN: [id number] Date of Birth: 1951/02/03  Date of referral:  09/08/16               Reason for consult:  Facility Placement, Discharge Planning                Permission sought to share information with:  Facility Sport and exercise psychologist, Family Supports Permission granted to share information::  Yes, Verbal Permission Granted  Name::     Secondary school teacher::  SNF's  Relationship::  Wife  Contact Information:  [phone]  Housing/Transportation Living arrangements for the past 2 months:  Single Family Home Source of Information:  Medical Team, Spouse Patient Interpreter Needed:  None Criminal Activity/Legal Involvement Pertinent to Current Situation/Hospitalization:  No - Comment as needed Significant Relationships:  Adult Children, Spouse Lives with:  Spouse Do you feel safe going back to the place where you live?  Yes Need for family participation in patient care:  Yes (Comment)  Care giving concerns:  PT recommending CIR. CSW facilitating SNF backup.   Social Worker assessment / plan:  Per RN, patient oriented to only self and place. Patient's wife not in the room. CSW called patient's wife. CSW introduced role and explained that PT recommendations would be discussed. Patient's wife agreeable to SNF backup in case CIR does not work out. Patient had cath done yesterday and is getting an ICD on Monday. No further concerns. CSW encouraged patient's wife to contact CSW as needed. CSW will continue to follow patient and his family for support and facilitate discharge to SNF once medically stable, if appropriate.  Employment status:  Retired Nurse, adult PT Recommendations:  Inpatient [place] / Referral to community resources:  [place]  Patient/Family's Response to care:  Patient not fully oriented. Patient's wife agreeable to SNF backup.  Patient's family supportive and involved in patient's care. Patient's wife appreciated social work intervention.  Patient/Family's Understanding of and Emotional Response to Diagnosis, Current Treatment, and Prognosis:  Patient not fully oriented. Patient's wife understands and is agreeable to discharge plan. Patient's wife appears happy with hospital care.  Emotional Assessment Appearance:  Appears stated age Attitude/Demeanor/Rapport:  Unable to Assess Affect (typically observed):  Unable to Assess Orientation:  Oriented to Self, Oriented to Place Alcohol / Substance use:  Never Used Psych involvement (Current and /or in the community):  No (Comment)  Discharge Needs  Concerns to be addressed:  Care Coordination Readmission within the last 30 days:  No Current discharge risk:  Cognitively Impaired, Dependent with Mobility Barriers to Discharge:  Continued Medical Work up, [place], LCSW 09/08/2016, 11:48 AM

## 2016-09-08 NOTE — Progress Notes (Signed)
Speech Language Pathology Treatment: Dysphagia  Patient Details Name: Dean [name] MRN: [id number] DOB: [birth year] Today's Date: 09/08/2016 Time: [id number] SLP Time Calculation (min) (ACUTE ONLY): 11 min  Assessment / Plan / Recommendation Clinical Impression  Skilled treatment session focused on dysphagia goals. SLP provided therapeutic intervention to aid in initiation of swallow trigger. Repositioned pt, cleaned face and mouth for better responsiveness. Max A multimodal strategies (cold washcloth, tactile, verbal stimulation) with pt moving away from stimulation but unable to demonstrate alertness. As a result, PO trials were not attempted during this session. Pt inappropriate for further instrumental testing this day d/t lethargy. Education provided to nursing and RD. Will continue to f/u for diet tolerance during this hospitalization.    HPI HPI: Pt is a 65 y.o. male with PMH of asthma, DM, gout, HTN who callapsed pulseless on 12/3 requiring CPR x 21 minutes, 7 rounds of epi, 5 shocks, amiodarone and magnesium, was intubated. EEG 12/4 was markedly abnormal wiht severe diffuse cerebral dysfunction. Pt was extubated 12/7. Pt reportedly doing better on this date, family wanting him to eat. Bedside swallow eval ordered. CXR 12/7 showed increasing bibasilar atelectasis and small left effusion.      SLP Plan  Continue with current plan of care     Recommendations  Diet recommendations: Honey-thick liquid;Dysphagia 2 (fine chop) Liquids provided via: Cup;No straw Medication Administration: Whole meds with puree Supervision: Full supervision/cueing for compensatory strategies;Staff to assist with self feeding Compensations: Slow rate;Small sips/bites Postural Changes and/or Swallow Maneuvers: Seated upright 90 degrees;Upright 30-60 min after meal                Oral Care Recommendations: Oral care BID Follow up Recommendations: Skilled Nursing facility;24 hour  supervision/assistance Plan: Continue with current plan of care       [place] 09/08/2016, 11:56 AM

## 2016-09-08 NOTE — NC FL2 (Signed)
[place] LEVEL OF CARE SCREENING TOOL     IDENTIFICATION  Patient Name: Dean [name] Birthdate: 15-Mar-1951 Sex: male Admission Date (Current Location): 08/27/2016  [facility] and [place] Number:  Herbalist and Address:  [facility] [place], [place] [address] [place], [place] [address]      Provider Number: [id number]  Attending Physician Name and Address:  Modena Jansky, MD  Relative Name and Phone Number:       Current Level of Care: Hospital Recommended Level of Care: [place] Prior Approval Number:    Date Approved/Denied:   PASRR Number: [id number]  Discharge Plan: SNF    Current Diagnoses: Patient Active Problem List   Diagnosis Date Noted  . Uncomplicated asthma   . Diabetes mellitus type 2 in obese ([place])   . Acute on chronic combined systolic and diastolic CHF (congestive heart failure) ([place])   . Tachypnea   . Hypernatremia   . Hypokalemia   . Leukocytosis   . Acute blood loss anemia   . Hypoxemia   . Acute respiratory failure ([place])   . Acute encephalopathy   . Cardiopulmonary arrest ([place]) 08/27/2016  . DM2 (diabetes mellitus, type 2) ([place]) 03/02/2014  . Morbid obesity ([place]) 03/02/2014  . Essential hypertension, benign 03/02/2014    Orientation RESPIRATION BLADDER Height & Weight     Self, Place  Normal Incontinent, External catheter Weight: 266 lb (120.7 kg) Height:  5\' 6"  (167.6 cm)  BEHAVIORAL SYMPTOMS/MOOD NEUROLOGICAL BOWEL NUTRITION STATUS   (Impulsive)  (None) Incontinent Diet (DYS 2, Fluid honey thick.)  AMBULATORY STATUS COMMUNICATION OF NEEDS Skin   Limited Assist Verbally Normal                       Personal Care Assistance Level of Assistance  Bathing, Feeding, Dressing Bathing Assistance: Limited assistance Feeding assistance: Independent Dressing Assistance: Limited assistance     Functional Limitations Info  Sight, Hearing, Speech Sight Info: Adequate Hearing  Info: Adequate Speech Info: Adequate    SPECIAL CARE FACTORS FREQUENCY  PT (By licensed PT), OT (By licensed OT), Blood pressure, Diabetic urine testing     PT Frequency: 5 x week OT Frequency: 5 x week            Contractures Contractures Info: Not present    Additional Factors Info  Code Status, Allergies Code Status Info: Full Allergies Info: NKDA           Current Medications (09/08/2016):  This is the current hospital active medication list Current Facility-Administered Medications  Medication Dose Route Frequency Provider Last Rate Last Dose  . 0.9 %  sodium chloride infusion   Intravenous Continuous PRN Mauri Brooklyn, MD 10 mL/hr at 09/07/16 1530    . 0.9 %  sodium chloride infusion  250 mL Intravenous PRN Peter M Martinique, MD      . acetaminophen (TYLENOL) solution 650 mg  650 mg Oral Q4H PRN Modena Jansky, MD   650 mg at 09/08/16 0516  . amiodarone (PACERONE) tablet 400 mg  400 mg Oral BID Baldwin Jamaica, PA-C   400 mg at 09/08/16 1052  . carvedilol (COREG) tablet 12.5 mg  12.5 mg Oral BID WC Rogelia Mire, NP   12.5 mg at 09/08/16 [id number]  . chlorhexidine (PERIDEX) 0.12 % solution 15 mL  15 mL Mouth Rinse BID Rush Farmer, MD   15 mL at 09/08/16 1053  . docusate (COLACE) 50  MG/5ML liquid 100 mg  100 mg Oral Daily Modena Jansky, MD   100 mg at 09/08/16 1054  . famotidine (PEPCID) tablet 20 mg  20 mg Oral BID Modena Jansky, MD   20 mg at 09/08/16 1052  . furosemide (LASIX) tablet 20 mg  20 mg Oral Daily Peter M Martinique, MD   20 mg at 09/08/16 1052  . heparin injection 5,000 Units  5,000 Units Subcutaneous Q8H Peter M Martinique, MD   5,000 Units at 09/08/16 0513  . hydrALAZINE (APRESOLINE) injection 10 mg  10 mg Intravenous Q6H PRN Dorothy Spark, MD   10 mg at 09/08/16 0513  . insulin aspart (novoLOG) injection 0-9 Units  0-9 Units Subcutaneous TID WC Modena Jansky, MD   1 Units at 09/08/16 0620  . MEDLINE mouth rinse  15 mL Mouth Rinse q12n4p Rush Farmer, MD   15 mL at 09/07/16 1146  . nitroGLYCERIN (NITROGLYN) 2 % ointment 1 inch  1 inch Topical Q6H Minus Breeding, MD   1 inch at 09/08/16 [phone]  . ondansetron (ZOFRAN) injection 4 mg  4 mg Intravenous Q6H PRN Peter M Martinique, MD      . RESOURCE THICKENUP CLEAR   Oral PRN Rush Farmer, MD      . sodium chloride flush (NS) 0.9 % injection 10-40 mL  10-40 mL Intracatheter PRN Rush Farmer, MD      . sodium chloride flush (NS) 0.9 % injection 3 mL  3 mL Intravenous Q12H Peter M Martinique, MD   3 mL at 09/07/16 2140  . sodium chloride flush (NS) 0.9 % injection 3 mL  3 mL Intravenous PRN Peter M Martinique, MD         Discharge Medications: Please see discharge summary for a list of discharge medications.  Relevant Imaging Results:  Relevant Lab Results:   Additional Information SS#: [id number]  Candie Chroman, LCSW

## 2016-09-08 NOTE — Progress Notes (Signed)
Patient Name: Dean [name] Date of Encounter: 09/08/2016  Primary Cardiologist: Dr. [facility] Problem List     Principal Problem:   Cardiopulmonary arrest [facility]) Active Problems:   DM2 (diabetes mellitus, type 2) ([place])   Morbid obesity ([place])   Essential hypertension, benign   Acute encephalopathy   Acute respiratory failure ([place])   Uncomplicated asthma   Diabetes mellitus type 2 in obese ([facility])   Acute on chronic combined systolic and diastolic CHF (congestive heart failure) ([facility])   Tachypnea   Hypernatremia   Hypokalemia   Leukocytosis   Acute blood loss anemia   Hypoxemia     Subjective   No complaints  Inpatient Medications    Scheduled Meds: . amiodarone  400 mg Oral BID  . carvedilol  12.5 mg Oral BID WC  . chlorhexidine  15 mL Mouth Rinse BID  . docusate  100 mg Oral Daily  . famotidine  20 mg Oral BID  . furosemide  20 mg Oral Daily  . heparin  5,000 Units Subcutaneous Q8H  . insulin aspart  0-9 Units Subcutaneous TID WC  . mouth rinse  15 mL Mouth Rinse q12n4p  . nitroGLYCERIN  1 inch Topical Q6H  . sodium chloride flush  3 mL Intravenous Q12H   Continuous Infusions: . sodium chloride 10 mL/hr at 09/07/16 1530   PRN Meds: sodium chloride, sodium chloride, acetaminophen (TYLENOL) oral liquid 160 mg/5 mL, hydrALAZINE, ondansetron (ZOFRAN) IV, RESOURCE THICKENUP CLEAR, sodium chloride flush, sodium chloride flush   Vital Signs    Vitals:   09/07/16 1400 09/07/16 2029 09/08/16 0431 09/08/16 0600  BP: 94/79 112/84 (!) 180/50 140/80  Pulse: 72 82 86   Resp:   18   Temp:  98.6 F (37 C) 100 F (37.8 C) 98 F (36.7 C)  TempSrc:  Oral Oral Oral  SpO2:  93% 100%   Weight:   266 lb (120.7 kg)   Height:        Intake/Output Summary (Last 24 hours) at 09/08/16 1223 Last data filed at 09/08/16 0800  Gross per 24 hour  Intake              745 ml  Output              700 ml  Net               45 ml   Filed Weights   09/06/16 0358  09/07/16 0601 09/08/16 0431  Weight: 259 lb (117.5 kg) 253 lb 6.4 oz (114.9 kg) 266 lb (120.7 kg)    Physical Exam   GEN: Well nourished, well developed, in no acute distress. Resting comfortably HEENT: Grossly normal.  Neck: Supple, no JVD, carotid bruits, or masses. Cardiac: RRR, no murmurs, rubs, or gallops. No clubbing, cyanosis, edema.  Radials/DP/PT 2+ and equal bilaterally.  Respiratory:  Respirations regular and unlabored, GI: Soft, nontender, nondistended, BS + x 4. obese MS: no deformity or atrophy. No right wrist hematoma Skin: warm and dry, no rash. Neuro:  Strength and sensation are intact. Psych: AAOx3.  Normal affect.  Labs    CBC  Recent Labs  09/06/16 0431 09/07/16 1155  WBC 14.3* 13.3*  HGB 13.0 11.9*  HCT 41.5 37.8*  MCV 84.7 84.8  PLT 227 [id number]   Basic Metabolic Panel  Recent Labs  09/06/16 0431 09/06/16 1042  09/07/16 0844 09/07/16 1155 09/08/16 0256  NA 145  --   < > 143  --  141  K 4.0  --   < > 3.4*  --  4.2  CL 112*  --   < > 108  --  106  CO2 18*  --   < > 25  --  22  GLUCOSE 95  --   < > 106*  --  93  BUN 19  --   < > 14  --  14  CREATININE 1.05  --   < > 1.07 1.09 1.12  CALCIUM 10.0  --   < > 9.7  --  9.7  MG  --  2.0  --   --   --   --   PHOS 2.4*  --   --   --   --   --   < > = values in this interval not displayed. Liver Function Tests  Recent Labs  09/08/16 0256  AST 59*  ALT 101*  ALKPHOS 88  BILITOT 1.8*  PROT 6.9  ALBUMIN 3.0*   No results for input(s): LIPASE, AMYLASE in the last 72 hours. Cardiac Enzymes No results for input(s): CKTOTAL, CKMB, CKMBINDEX, TROPONINI in the last 72 hours. BNP Invalid input(s): POCBNP D-Dimer No results for input(s): DDIMER in the last 72 hours. Hemoglobin A1C No results for input(s): HGBA1C in the last 72 hours. Fasting Lipid Panel  Recent Labs  09/07/16 0844  CHOL 159  HDL 25*  LDLCALC 105*  TRIG 146  CHOLHDL 6.4   Thyroid Function Tests No results for input(s): TSH,  T4TOTAL, T3FREE, THYROIDAB in the last 72 hours.  Invalid input(s): FREET3  Telemetry    NSR - Personally Reviewed  ECG      Radiology    No results found.  Cardiac Studies   Cath with CTO RCA, likely inferior scar  Patient Profile     65 y/o with cardiac arrrest and now NSVT.  Assessment & Plan    1) No angina.  Plan to manage CAD medically.  EP has seen and plans AICD.  Continue ACE-I and beta blocker for LV dysfunction.   Amio for NSVT.  Signed, Larae Grooms, MD  09/08/2016, 12:23 PM

## 2016-09-08 NOTE — Clinical Social Work Placement (Signed)
   CLINICAL SOCIAL WORK PLACEMENT  NOTE  Date:  09/08/2016  Patient Details  Name: Dean [name] MRN: [id number] Date of Birth: 08-28-1951  Clinical Social Work is seeking post-discharge placement for this patient at the [place] level of care (*CSW will initial, date and re-position this form in  chart as items are completed):  Yes   Patient/family provided with [facility] Work Department's list of facilities offering this level of care within the geographic area requested by the patient (or if unable, by the patient's family).  Yes   Patient/family informed of their freedom to choose among providers that offer the needed level of care, that participate in Medicare, Medicaid or managed care program needed by the patient, have an available bed and are willing to accept the patient.  Yes   Patient/family informed of [facility]'s ownership interest in [facility] and [facility], as well as of the fact that they are under no obligation to receive care at these facilities.  PASRR submitted to EDS on 09/08/16     PASRR number received on 09/08/16     Existing PASRR number confirmed on       FL2 transmitted to all facilities in geographic area requested by pt/family on 09/08/16     FL2 transmitted to all facilities within larger geographic area on       Patient informed that his/her managed care company has contracts with or will negotiate with certain facilities, including the following:            Patient/family informed of bed offers received.  Patient chooses bed at       Physician recommends and patient chooses bed at      Patient to be transferred to   on  .  Patient to be transferred to facility by       Patient family notified on   of transfer.  Name of family member notified:        PHYSICIAN Please sign FL2     Additional Comment:    _______________________________________________ Candie Chroman, LCSW 09/08/2016,  11:52 AM

## 2016-09-08 NOTE — Procedures (Signed)
Placed patient on BIPAP auto titrate IPAP max 20, EPAP min 5 for the night.  Patient is tolerating well at this time.

## 2016-09-08 NOTE — Progress Notes (Signed)
PROGRESS NOTE  Dean [name]  [id number] DOB: 1951-01-01  DOA: 08/27/2016 PCP: [name], MD   Brief Narrative:  65 yo MO (310 lbs 5'5") AAM who was walking back from the bathroom 12/3 when he collapsed pulseless. CPR wa not performed by family and EMS arrived in 10 minutes and performed CPR x 21 minutes, 7 rounds of epi, 5 shocks, amiodarone and magnesium. He coded again with ROSC. [place] airway was changed by EDP. He was taken to CT scanner with NAD of brain, transfer to CCU and cardiology was called. Hypothermia protocol was initiated at 1545 after ct head. After clinical improvement and stabilization, care transferred from [facility] to [facility] on 09/06/16   SIGNIFICANT EVENTS: 12/3 Cardiac arrest  12/5 > Re-Warmed, off sedation  12/7 extubated  12/8 Acutely confused overnight, pulled CVC, ativan ordered. Mittens in place  12/9 - wife and daughter at bedside - feel encephalopathy is rapidly improving. sTill sleepy but much better. They think he is ready to eat. STill per RN can slip out of bed and needs siter. No on infusion. opulse ox 97% 2L [place]. 94% on RA x 71min  Assessment & Plan:   Principal Problem:   Cardiopulmonary arrest ([place]) Active Problems:   DM2 (diabetes mellitus, type 2) ([place])   Morbid obesity ([place])   Essential hypertension, benign   Acute encephalopathy   Acute respiratory failure ([place])   Uncomplicated asthma   Diabetes mellitus type 2 in obese (H[facility])   Acute on chronic combined systolic and diastolic CHF (congestive heart failure) ([facility])   Tachypnea   Hypernatremia   Hypokalemia   Leukocytosis   Acute blood loss anemia   Hypoxemia   1. Status post cardiac arrest 08/27/16 with 31 minutes down: VT/VF arrest,? Secondary to respiratory process. Cardiology following. Underwent cardiac cath 12/14 and detailed results as below. 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  Medical management recommended by cardiology. EP plans ICD on 12/18. 2. NSVT: Continue metoprolol. EP consultation appreciated. Amiodarone started. For AICD 09/11/16. K>4, Mg 2. 3. 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 [id number] since admission. Cardiology has switched metoprolol to carvedilol. IV Lasix transitioned to oral Lasix possibly 12/15. Plans to add an ARB if renal function stable post cath with ultimate plan to transition to [facility] 4. Acute respiratory failure with hypoxia: Extubated 12/7. Reassess prior to discharge regarding need for home oxygen. 5. Acute kidney injury: Resolved. 6. Hypokalemia: Replaced. Magnesium normal. 7. Dysphagia: Speech therapy reevaluated and recommended dysphagia 2 diet. 8. DM type II: Continue SSI. 9. Essential hypertension: Mildly uncontrolled. Currently on NTP transdermal and beta blockers changed to carvedilol. ARB if renal functions remain stable. 10. Acute encephalopathy: Agitation/delirium versus anoxic injury. CT head negative. Avoid sedative and QT prolonging medications. Precedex discontinued 09/05/16. Improving.  11. History of asthma: Stable. Status post extubation 08/31/16. As per family at bedside on 12/14, mental status gradually improving. 12. Prolonged QTC: 508 ms on 09/02/16 and down to 473 ms on 09/04/16.   DVT prophylaxis: Heparin Code Status: Full Family Communication: Discussed in detail with patient's spouse, son and brother at bedside on 12/14. None at bedside. Disposition Plan: DC when medically stable,? CIR.   Consultants:   CCM-signed off  Cardiology/EP  Procedures:  12/3 ET>>12/7 12/3 left I J CVL>>12/8 2-D echo 08/28/16: Study Conclusions  - Left ventricle: The cavity size was mildly dilated. Wall  thickness was increased in a pattern of severe LVH. Systolic   function was moderately reduced. The estimated ejection fraction   was in the range of 35% to 40%.  Akinesis of the basalinferior and   apical myocardium. Features are consistent with a pseudonormal   left ventricular filling pattern, with concomitant abnormal   relaxation and increased filling pressure (grade 2 diastolic   dysfunction). - Aortic valve: There was mild regurgitation. - Mitral valve: There was mild regurgitation. - Left atrium: The atrium was severely dilated.   Left Heart Cath and Coronary Angiography  Conclusion     There is moderate left ventricular systolic dysfunction.  LV end diastolic pressure is normal.  The left ventricular ejection fraction is 35-45% by visual estimate.  Ost RCA lesion, 90 %stenosed.  Mid RCA to Dist RCA lesion, 100 %stenosed.   1. Single vessel occlusive CAD. The distal RCA is occluded with left to right collaterals. There is a 90% ostial stenosis 2. Moderate LV dysfunction with EF 35-40%. Akinesis of the basal to mid inferior wall 3. Normal LVEDP  Plan: medical therapy. If he has significant angina we could consider PCI of the RCA but there appears to be significant scar of the inferior wall.       Antimicrobials:   None   Subjective: Denies complaints. No dyspnea or chest pain reported. As per RN, no acute issues.  Objective:  Vitals:   09/07/16 2029 09/08/16 0431 09/08/16 0600 09/08/16 1301  BP: 112/84 (!) 180/50 140/80 (!) 143/41  Pulse: 82 86  72  Resp:  18  18  Temp: 98.6 F (37 C) 100 F (37.8 C) 98 F (36.7 C) 98 F (36.7 C)  TempSrc: Oral Oral Oral Axillary  SpO2: 93% 100%  98%  Weight:  120.7 kg (266 lb)    Height:      T: 98.9, RR: 20, O2 Sat: 96.  Intake/Output Summary (Last 24 hours) at 09/08/16 1349 Last data filed at 09/08/16 1342  Gross per 24 hour  Intake              385 ml  Output              500 ml  Net             -115 ml   Filed Weights   09/06/16 0358 09/07/16 0601 09/08/16 0431  Weight: 117.5 kg (259 lb) 114.9 kg (253 lb 6.4 oz) 120.7 kg (266 lb)    Examination:  General  exam: Pleasant middle-aged male sitting up comfortably In chair this morning. Respiratory system: Diminished breath sounds in the bases. Rest of lung fields clear to auscultation. Respiratory effort normal. Cardiovascular system: S1 & S2 heard, RRR. No JVD, murmurs, rubs, gallops or clicks. No pedal edema. telemetry: Sinus rhythm. Intermittent episodes of NSVT.  Gastrointestinal system: Abdomen is nondistended, soft and nontender. No organomegaly or masses felt. Normal bowel sounds heard. Central nervous system: Alert and oriented to person and partly to place only . No focal neurological deficits. Extremities: Symmetric 5 x 5 power. Skin: No rashes, lesions or ulcers Psychiatry: Judgement and insight impaired . Mood & affect appropriate.     Data Reviewed: I have personally reviewed following labs and imaging studies  CBC:  Recent Labs Lab 09/02/16 0541 09/05/16 0255 09/06/16 0431 09/07/16 1155  WBC 12.1* 11.3* 14.3* 13.3*  HGB 12.1* 12.3* 13.0 11.9*  HCT 39.3 39.4 41.5 37.8*  MCV 86.2 85.1 84.7 84.8  PLT 232 243 227  [id number]   Basic Metabolic Panel:  Recent Labs Lab 09/02/16 0541 09/05/16 0255 09/06/16 0431 09/06/16 1042 09/07/16 0227 09/07/16 0844 09/07/16 1155 09/08/16 0256  NA 148* 144 145  --  143 143  --  141  K 3.5 3.2* 4.0  --  2.9* 3.4*  --  4.2  CL 109 106 112*  --  106 108  --  106  CO2 27 27 18*  --  25 25  --  22  GLUCOSE 115* 103* 95  --  103* 106*  --  93  BUN 16 21* 19  --  15 14  --  14  CREATININE 1.02 1.14 1.05  --  1.17 1.07 1.09 1.12  CALCIUM 10.3 10.0 10.0  --  9.9 9.7  --  9.7  MG 2.2 2.3  --  2.0  --   --   --   --   PHOS 2.9 2.7 2.4*  --   --   --   --   --    GFR: Estimated Creatinine Clearance: 79.5 mL/min (by C-G formula based on SCr of 1.12 mg/dL). Liver Function Tests:  Recent Labs Lab 09/08/16 0256  AST 59*  ALT 101*  ALKPHOS 88  BILITOT 1.8*  PROT 6.9  ALBUMIN 3.0*   No results for input(s): LIPASE, AMYLASE in the last 168  hours. No results for input(s): AMMONIA in the last 168 hours. Coagulation Profile:  Recent Labs Lab 09/07/16 0710  INR 1.13   Cardiac Enzymes: No results for input(s): CKTOTAL, CKMB, CKMBINDEX, TROPONINI in the last 168 hours. BNP (last 3 results) No results for input(s): PROBNP in the last 8760 hours. HbA1C: No results for input(s): HGBA1C in the last 72 hours. CBG:  Recent Labs Lab 09/07/16 1146 09/07/16 1720 09/07/16 2059 09/08/16 0558 09/08/16 1153  GLUCAP 115* 112* 119* 125* 138*   Lipid Profile:  Recent Labs  09/07/16 0844  CHOL 159  HDL 25*  LDLCALC 105*  TRIG 146  CHOLHDL 6.4   Thyroid Function Tests: No results for input(s): TSH, T4TOTAL, FREET4, T3FREE, THYROIDAB in the last 72 hours. Anemia Panel: No results for input(s): VITAMINB12, FOLATE, FERRITIN, TIBC, IRON, RETICCTPCT in the last 72 hours.  Sepsis Labs: No results for input(s): PROCALCITON, LATICACIDVEN in the last 168 hours.  No results found for this or any previous visit (from the past 240 hour(s)).       Radiology Studies: No results found.      Scheduled Meds: . amiodarone  400 mg Oral BID  . carvedilol  12.5 mg Oral BID WC  . chlorhexidine  15 mL Mouth Rinse BID  . docusate  100 mg Oral Daily  . famotidine  20 mg Oral BID  . furosemide  20 mg Oral Daily  . heparin  5,000 Units Subcutaneous Q8H  . insulin aspart  0-9 Units Subcutaneous TID WC  . mouth rinse  15 mL Mouth Rinse q12n4p  . nitroGLYCERIN  1 inch Topical Q6H  . sodium chloride flush  3 mL Intravenous Q12H   Continuous Infusions: . sodium chloride 10 mL/hr at 09/07/16 1530     LOS: 12 days       [facility], MD [facility] Pager [phone]  If 7PM-7AM, please contact night-coverage [email or web address] Password [email or web address] 09/08/2016, 1:49 PM

## 2016-09-08 NOTE — Progress Notes (Signed)
PT Cancellation Note  Patient Details Name: Dean [name] MRN: [id number] DOB: October 07, 1950   Cancelled Treatment:    Reason Eval/Treat Not Completed: Fatigue/lethargy limiting ability to participate   Duncan Dull 09/08/2016, 1:39 PM Alben Deeds, [place] DPT  [phone]

## 2016-09-09 ENCOUNTER — Inpatient Hospital Stay (HOSPITAL_COMMUNITY): Payer: Medicare Other

## 2016-09-09 DIAGNOSIS — R5081 Fever presenting with conditions classified elsewhere: Secondary | ICD-10-CM

## 2016-09-09 DIAGNOSIS — N3000 Acute cystitis without hematuria: Secondary | ICD-10-CM

## 2016-09-09 DIAGNOSIS — R509 Fever, unspecified: Secondary | ICD-10-CM

## 2016-09-09 DIAGNOSIS — J9611 Chronic respiratory failure with hypoxia: Secondary | ICD-10-CM

## 2016-09-09 LAB — URINALYSIS, ROUTINE W REFLEX MICROSCOPIC
Glucose, UA: NEGATIVE mg/dL
Hgb urine dipstick: NEGATIVE
Ketones, ur: NEGATIVE mg/dL
Nitrite: NEGATIVE
Protein, ur: 30 mg/dL — AB
Specific Gravity, Urine: 1.025 (ref 1.005–1.030)
pH: 5 (ref 5.0–8.0)

## 2016-09-09 LAB — GLUCOSE, CAPILLARY
Glucose-Capillary: 138 mg/dL — ABNORMAL HIGH (ref 65–99)
Glucose-Capillary: 121 mg/dL — ABNORMAL HIGH (ref 65–99)
Glucose-Capillary: 116 mg/dL — ABNORMAL HIGH (ref 65–99)
Glucose-Capillary: 119 mg/dL — ABNORMAL HIGH (ref 65–99)

## 2016-09-09 MED ORDER — ACETAMINOPHEN 325 MG PO TABS
650.0000 mg | ORAL_TABLET | ORAL | Status: DC | PRN
  Administered 2016-09-09 – 2016-09-10 (×3): 650 mg via ORAL
  Filled 2016-09-09 (×3): qty 2

## 2016-09-09 MED ORDER — LEVOFLOXACIN IN D5W 750 MG/150ML IV SOLN
750.0000 mg | Freq: Every day | INTRAVENOUS | Status: DC
  Administered 2016-09-09 – 2016-09-10 (×2): 750 mg via INTRAVENOUS
  Filled 2016-09-09 (×2): qty 150

## 2016-09-09 NOTE — Progress Notes (Signed)
   09/08/16 1948  Vitals  Temp 99.7 F (37.6 C)  Temp Source Oral  BP (!) 124/44  BP Location Right Arm  BP Method Automatic  Patient Position (if appropriate) Lying  Pulse Rate 91  Pulse Rate Source Dinamap  Resp 17  Oxygen Therapy  SpO2 97 %  O2 Device Room Air  Pt's temp rechecked and wife was informed. Pt alert and oriented to self only. Will continue to monitor.

## 2016-09-09 NOTE — Progress Notes (Signed)
Patient Name: Dean [name] Date of Encounter: 09/09/2016  Primary Cardiologist: [facility]. [facility] Problem List     Principal Problem:   Cardiopulmonary arrest [facility]) Active Problems:   DM2 (diabetes mellitus, type 2) ([place])   Morbid obesity ([place])   Essential hypertension, benign   Acute encephalopathy   Acute respiratory failure ([place])   Uncomplicated asthma   Diabetes mellitus type 2 in obese (HCC)   Acute on chronic combined systolic and diastolic CHF (congestive heart failure) (HCC)   Tachypnea   Hypernatremia   Hypokalemia   Leukocytosis   Acute blood loss anemia   Hypoxemia   NSVT (nonsustained ventricular tachycardia) ([place])   65 y.o. male with PMHx of obesity, DM II, and HTN, admitted to [facility] 08/27/16 after a witness cardiac arrest at home  Subjective   He is sleeping and would not wake up for me this morning. The nurse service that he is frequently confused and combative.  Inpatient Medications    Scheduled Meds: . amiodarone  400 mg Oral BID  . carvedilol  12.5 mg Oral BID WC  . chlorhexidine  15 mL Mouth Rinse BID  . docusate  100 mg Oral Daily  . famotidine  20 mg Oral BID  . furosemide  20 mg Oral Daily  . heparin  5,000 Units Subcutaneous Q8H  . insulin aspart  0-9 Units Subcutaneous TID WC  . levofloxacin (LEVAQUIN) IV  750 mg Intravenous Q0600  . mouth rinse  15 mL Mouth Rinse q12n4p  . nitroGLYCERIN  1 inch Topical Q6H  . sodium chloride flush  3 mL Intravenous Q12H   Continuous Infusions: . sodium chloride 10 mL/hr at 09/07/16 1530   PRN Meds: sodium chloride, sodium chloride, acetaminophen, hydrALAZINE, ondansetron (ZOFRAN) IV, RESOURCE THICKENUP CLEAR, sodium chloride flush, sodium chloride flush   Vital Signs    Vitals:   09/08/16 2258 09/08/16 2310 09/09/16 0424 09/09/16 0800  BP:   (!) 137/46   Pulse: 61  78   Resp: 18  20   Temp:  98.4 F (36.9 C) (!) 102.4 F (39.1 C) 98.6 F (37 C)  TempSrc:  Oral Oral Oral  SpO2: 95%  97%    Weight:   118 kg (260 lb 1.6 oz)   Height:        Intake/Output Summary (Last 24 hours) at 09/09/16 1036 Last data filed at 09/09/16 0915  Gross per 24 hour  Intake              750 ml  Output              200 ml  Net              550 ml   Filed Weights   09/07/16 0601 09/08/16 0431 09/09/16 0424  Weight: 114.9 kg (253 lb 6.4 oz) 120.7 kg (266 lb) 118 kg (260 lb 1.6 oz)    Physical Exam   GEN: Obese gentleman he is currently sleeping in bed. HEENT: Grossly normal.  Neck: Supple, no JVD, carotid bruits, or masses. Cardiac: RRR, no murmurs, rubs, or gallops. No clubbing, cyanosis, edema.  Radials/DP/PT 2+ and equal bilaterally.  Respiratory:  Respirations regular and unlabored, GI: Soft, nontender, nondistended, BS + x 4. obese MS: no deformity or atrophy. No right wrist hematoma Skin: warm and dry, no rash. Neuro:  Unable to assess Psych: Unable to assess  Labs    CBC  Recent Labs  09/07/16 1155  WBC 13.3*  HGB 11.9*  HCT 37.8*  MCV 84.8  PLT [id number]   Basic Metabolic Panel  Recent Labs  09/06/16 1042  09/07/16 0844 09/07/16 1155 09/08/16 0256  NA  --   < > 143  --  141  K  --   < > 3.4*  --  4.2  CL  --   < > 108  --  106  CO2  --   < > 25  --  22  GLUCOSE  --   < > 106*  --  93  BUN  --   < > 14  --  14  CREATININE  --   < > 1.07 1.09 1.12  CALCIUM  --   < > 9.7  --  9.7  MG 2.0  --   --   --   --   < > = values in this interval not displayed. Liver Function Tests  Recent Labs  09/08/16 0256  AST 59*  ALT 101*  ALKPHOS 88  BILITOT 1.8*  PROT 6.9  ALBUMIN 3.0*   No results for input(s): LIPASE, AMYLASE in the last 72 hours. Cardiac Enzymes No results for input(s): CKTOTAL, CKMB, CKMBINDEX, TROPONINI in the last 72 hours. BNP Invalid input(s): POCBNP D-Dimer No results for input(s): DDIMER in the last 72 hours. Hemoglobin A1C No results for input(s): HGBA1C in the last 72 hours. Fasting Lipid Panel  Recent Labs  09/07/16 0844  CHOL  159  HDL 25*  LDLCALC 105*  TRIG 146  CHOLHDL 6.4   Thyroid Function Tests No results for input(s): TSH, T4TOTAL, T3FREE, THYROIDAB in the last 72 hours.  Invalid input(s): FREET3  Telemetry    NSR - Personally Reviewed  ECG      Radiology    Dg Chest Port 1 View  Result Date: 09/09/2016 CLINICAL DATA:  Fever, cough for several months EXAM: PORTABLE CHEST 1 VIEW COMPARISON:  09/04/2016 FINDINGS: Cardiomegaly with vascular congestion. No confluent opacities or effusions. No acute bony abnormality. IMPRESSION: Cardiomegaly, vascular congestion. Electronically Signed   By: Rolm Baptise M.D.   On: 09/09/2016 08:18    Cardiac Studies   Cath with CTO RCA, likely inferior scar  Patient Profile     65 y/o with cardiac arrrest and now NSVT.  Assessment & Plan    1) No angina.  Plan to manage CAD medically.  EP has seen and plans AICD.  Continue ACE-I and beta blocker for LV dysfunction.   Amio for NSVT.  I think that he will need to have some improvement in his mental status before we put an ICD in him  2. Altered mental status:   ? Encephalopathy following his cardiac arrest. Further management per the internal medicine team.   Signed, Mertie Moores, MD  09/09/2016, 10:36 AM

## 2016-09-09 NOTE — Progress Notes (Signed)
Physical Therapy Treatment Patient Details Name: Dean [name] MRN: [id number] DOB: 09-19-1951 Today's Date: 09/09/2016    History of Present Illness Pt is a 65 y.o. male with PMH of asthma, DM, gout, HTN who callapsed pulseless on 12/3. No CPR x 10 minutes then requiring CPR x 21 minutes, 7 rounds of epi, 5 shocks, amiodarone and magnesium, was intubated. EEG 12/4 was markedly abnormal wiht severe diffuse cerebral dysfunction. Pt was extubated 12/7,  PMH - asthma, DM, HTN, gout    PT Comments    Pt presented supine in bed with HOB elevated, awake and willing to participate in therapy session. Pt with multiple visitors present this session, and pt was accurately oriented to all. However, he was confused about the time and day, as well as frequently questioning why he was unable to have water to drink. PT reminded pt of his dietary restrictions. Pt also continues to require min A with safety and awareness during ambulation. Pt would continue to benefit from skilled physical therapy services at this time while admitted and after d/c to address his limitations in order to improve his overall safety and independence with functional mobility.   Follow Up Recommendations  CIR     Equipment Recommendations  Rolling walker with 5" wheels    Recommendations for Other Services OT consult     Precautions / Restrictions Precautions Precautions: Fall Restrictions Weight Bearing Restrictions: No    Mobility  Bed Mobility Overal bed mobility: Needs Assistance Bed Mobility: Supine to Sit;Sit to Supine     Supine to sit: Min guard;HOB elevated Sit to supine: Min guard   General bed mobility comments: pt required increased time, use of bed rails and min guard for safety  Transfers Overall transfer level: Needs assistance Equipment used: Rolling walker (2 wheeled) Transfers: Sit to/from Stand Sit to Stand: Min guard         General transfer comment: pt performed x2 from bed with min  guard for safety. Despite max VC'ing for bilateral hand placement, pt still used bilateral hands to pull up on RW  Ambulation/Gait Ambulation/Gait assistance: Min assist Ambulation Distance (Feet): 150 Feet Assistive device: Rolling walker (2 wheeled) Gait Pattern/deviations: Step-through pattern;Decreased stride length;Drifts right/left Gait velocity: decreased Gait velocity interpretation: Below normal speed for age/gender General Gait Details: mild instability with gait but no LOB. pt required VC'ing and min A for safety with RW.    Stairs            Wheelchair Mobility    Modified Rankin (Stroke Patients Only)       Balance Overall balance assessment: Needs assistance Sitting-balance support: Feet supported Sitting balance-Leahy Scale: Good     Standing balance support: During functional activity;Bilateral upper extremity supported Standing balance-Leahy Scale: Poor Standing balance comment: pt reliant on bilateral UEs on RW                    Cognition Arousal/Alertness: Awake/alert Behavior During Therapy: Flat affect Overall Cognitive Status: Impaired/Different from baseline Area of Impairment: Orientation;Following commands;Safety/judgement;Awareness Orientation Level: Disoriented to;Time Current Attention Level: Sustained Memory: Decreased short-term memory Following Commands: Follows one step commands consistently Safety/Judgement: Decreased awareness of safety;Decreased awareness of deficits   Problem Solving: Slow processing;Difficulty sequencing;Requires verbal cues;Requires tactile cues General Comments: Pt read clock and asked if it was AM or PM. Pt knew all visitors present (multiple family members). Pt also asking multiple times for water and questioning why he was unable to drink water at this time. Therapist explained  swallow/aspiration precautions with pt and verbalized understanding.    Exercises      General Comments         Pertinent Vitals/Pain Pain Assessment: No/denies pain    Home Living                      Prior Function            PT Goals (current goals can now be found in the care plan section) Acute Rehab PT Goals Patient Stated Goal: to go home  PT Goal Formulation: With patient Time For Goal Achievement: 09/18/16 Potential to Achieve Goals: Good Progress towards PT goals: Progressing toward goals    Frequency    Min 3X/week      PT Plan Current plan remains appropriate    Co-evaluation             End of Session Equipment Utilized During Treatment: Gait belt Activity Tolerance: Patient tolerated treatment well Patient left: in bed;with call bell/phone within reach;with bed alarm set;with nursing/sitter in room;with family/visitor present     Time: 0840-0909 PT Time Calculation (min) (ACUTE ONLY): 29 min  Charges:  $Gait Training: 8-22 mins $Therapeutic Activity: 8-22 mins                    G CodesClearnce [name] Dean [name] 2016-10-08, 10:56 AM Dean [name], Dean [name], DPT [phone]

## 2016-09-09 NOTE — Progress Notes (Signed)
Pharmacy Antibiotic Note  Dean [name] is a 65 y.o. male admitted on 08/27/2016 s/p post arrest.  Pharmacy has been consulted for Levaquin dosing for CAP.  Plan: Levaquin 750mg  IV q24h Will f/u micro data, renal function, and pt's clinical condition   Height: 5\' 6"  (167.6 cm) Weight: 260 lb 1.6 oz (118 kg) (bedscale) IBW/kg (Calculated) : 63.8  Temp (24hrs), Avg:99.4 F (37.4 C), Min:98 F (36.7 C), Max:102.4 F (39.1 C)   Recent Labs Lab 09/02/16 0541 09/05/16 0255 09/06/16 0431 09/07/16 0227 09/07/16 0844 09/07/16 1155 09/08/16 0256  WBC 12.1* 11.3* 14.3*  --   --  13.3*  --   CREATININE 1.02 1.14 1.05 1.17 1.07 1.09 1.12    Estimated Creatinine Clearance: 78.5 mL/min (by C-G formula based on SCr of 1.12 mg/dL).    No Known Allergies  Antimicrobials this admission: Vanc 12/3 >>12/4 Zosyn 12/3 >>12/4 Levaquin 12/16 >>  Microbiology results:  BCx:   UCx:   12/3 BCx: neg 12/3 MRSA PCR: neg   Thank you for allowing pharmacy to be a part of this patient's care.  Sherlon Handing, PharmD, BCPS Clinical pharmacist, pager [phone] 09/09/2016 4:55 AM

## 2016-09-09 NOTE — Progress Notes (Signed)
PROGRESS NOTE  Dean [name]  [id number] DOB: 10-Oct-1950  DOA: 08/27/2016 PCP: [name], MD   Brief Narrative:  65 yo MO (310 lbs 5'5") AAM who was walking back from the bathroom 12/3 when he collapsed pulseless. CPR wa not performed by family and EMS arrived in 10 minutes and performed CPR x 21 minutes, 7 rounds of epi, 5 shocks, amiodarone and magnesium. He coded again with ROSC. [place] airway was changed by EDP. He was taken to CT scanner with NAD of brain, transfer to CCU and cardiology was called. Hypothermia protocol was initiated at 1545 after ct head. After clinical improvement and stabilization, care transferred from [facility] to [facility] on 09/06/16   SIGNIFICANT EVENTS: 12/3 Cardiac arrest  12/5 > Re-Warmed, off sedation  12/7 extubated  12/8 Acutely confused overnight, pulled CVC, ativan ordered. Mittens in place  12/9 - wife and daughter at bedside - feel encephalopathy is rapidly improving. sTill sleepy but much better. They think he is ready to eat. STill per RN can slip out of bed and needs siter. No on infusion. opulse ox 97% 2L [place]. 94% on RA x 70min  Assessment & Plan:   Principal Problem:   Cardiopulmonary arrest ([place]) Active Problems:   DM2 (diabetes mellitus, type 2) ([place])   Morbid obesity ([place])   Essential hypertension, benign   Acute encephalopathy   Acute respiratory failure ([place])   Uncomplicated asthma   Diabetes mellitus type 2 in obese ([place])   Acute on chronic combined systolic and diastolic CHF (congestive heart failure) (HCC)   Tachypnea   Hypernatremia   Hypokalemia   Leukocytosis   Acute blood loss anemia   Hypoxemia   NSVT (nonsustained ventricular tachycardia) ([place])   1. Status post cardiac arrest 08/27/16 with 31 minutes down: VT/VF arrest,? Secondary to respiratory process. Cardiology following. Underwent cardiac cath 12/14 and detailed results as below. Single vessel occlusive CAD/distal RCA is occluded with left to right collaterals, LVEF 35-40  percent and akinesis of the basal to mid inferior wall. Medical management recommended by cardiology. EP plans ICD on 12/18. 2. NSVT: EP consultation appreciated. On coreg, Amiodarone started. For AICD 09/11/16. K>4, Mg 2. 3. 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 -4259 ML since admission. Cardiology has switched metoprolol to carvedilol. IV Lasix transitioned to oral Lasix possibly 12/15. Plans to add an ARB if renal function stable post cath with ultimate plan to transition to [facility] 4. Acute respiratory failure with hypoxia: Extubated 12/7. Reassess prior to discharge regarding need for home oxygen. 5. Acute kidney injury: Resolved. 6. Hypokalemia: Replaced. Magnesium normal. 7. Dysphagia: Speech therapy reevaluated and recommended dysphagia 2 diet. 8. DM type II: Continue SSI. 9. Essential hypertension: Mildly uncontrolled. Currently on NTP transdermal and beta blockers changed to carvedilol. ARB if renal functions remain stable. 10. Acute encephalopathy: Agitation/delirium versus anoxic injury. CT head negative. Avoid sedative and QT prolonging medications. Precedex discontinued 09/05/16. Improving.  11. History of asthma: Stable. Status post extubation 08/31/16. As per family at bedside on 12/14, mental status gradually improving. 12. Prolonged QTC: 508 ms on 09/02/16 and down to 473 ms on 09/04/16. 13. Fever, patient with temperature 102.4 overnight, denies any cough, chest x-ray with no acute findings, workup significant for urinalysis, continue with levofloxacin, follow urine and blood  cultures.   DVT prophylaxis: Heparin Code Status: Full Family Communication: Discussed in detail with multiple family members at bedside including daughter.   Disposition Plan: PT is recommending CIR, consult requested  Consultants:   CCM-signed off  Cardiology/EP  Procedures:  12/3 ET>>12/7 12/3 left I J CVL>>12/8 2-D echo 08/28/16:  Study Conclusions  - Left ventricle: The cavity size was mildly dilated. Wall   thickness was increased in a pattern of severe LVH. Systolic   function was moderately reduced. The estimated ejection fraction   was in the range of 35% to 40%. Akinesis of the basalinferior and   apical myocardium. Features are consistent with a pseudonormal   left ventricular filling pattern, with concomitant abnormal   relaxation and increased filling pressure (grade 2 diastolic   dysfunction). - Aortic valve: There was mild regurgitation. - Mitral valve: There was mild regurgitation. - Left atrium: The atrium was severely dilated.   Left Heart Cath and Coronary Angiography  Conclusion     There is moderate left ventricular systolic dysfunction.  LV end diastolic pressure is normal.  The left ventricular ejection fraction is 35-45% by visual estimate.  Ost RCA lesion, 90 %stenosed.  Mid RCA to Dist RCA lesion, 100 %stenosed.   1. Single vessel occlusive CAD. The distal RCA is occluded with left to right collaterals. There is a 90% ostial stenosis 2. Moderate LV dysfunction with EF 35-40%. Akinesis of the basal to mid inferior wall 3. Normal LVEDP  Plan: medical therapy. If he has significant angina we could consider PCI of the RCA but there appears to be significant scar of the inferior wall.       Antimicrobials:   None   Subjective: Denies complaints. No dyspnea or chest pain reported. fecer 102.4 overnight.  Objective:  Vitals:   09/08/16 2258 09/08/16 2310 09/09/16 0424 09/09/16 0800  BP:   (!) 137/46   Pulse: 61  78   Resp: 18  20   Temp:  98.4 F (36.9 C) (!) 102.4 F (39.1 C) 98.6 F (37 C)  TempSrc:  Oral Oral Oral  SpO2: 95%  97%   Weight:   118 kg (260 lb 1.6 oz)   Height:      T: 98.9, RR: 20, O2 Sat: 96.  Intake/Output Summary (Last 24 hours) at 09/09/16 1113 Last data filed at 09/09/16 0915  Gross per 24 hour  Intake              750 ml  Output               200 ml  Net              550 ml   Filed Weights   09/07/16 0601 09/08/16 0431 09/09/16 0424  Weight: 114.9 kg (253 lb 6.4 oz) 120.7 kg (266 lb) 118 kg (260 lb 1.6 oz)    Examination:  General exam: Pleasant middle-aged male sitting up comfortably In bed with PT this morning. Respiratory system: Diminished breath sounds in the bases. Rest of lung fields clear to auscultation. Respiratory effort normal. Cardiovascular system: S1 & S2 heard, RRR. No JVD, murmurs, rubs, gallops or clicks. No pedal edema. telemetry: Sinus rhythm. Intermittent episodes of NSVT.  Gastrointestinal system: Abdomen is nondistended, soft and nontender. No organomegaly or masses felt. Normal bowel sounds heard. Central nervous system: Alert and oriented to person and partly to place only . No focal neurological deficits. Extremities: Symmetric 5 x 5 power. Skin: No rashes, lesions or ulcers Psychiatry: Judgement and insight impaired . Mood & affect appropriate.     Data Reviewed: I have personally reviewed following labs and imaging studies  CBC:  Recent Labs Lab 09/05/16  0255 09/06/16 0431 09/07/16 1155  WBC 11.3* 14.3* 13.3*  HGB 12.3* 13.0 11.9*  HCT 39.4 41.5 37.8*  MCV 85.1 84.7 84.8  PLT 243 227 [id number]   Basic Metabolic Panel:  Recent Labs Lab 09/05/16 0255 09/06/16 0431 09/06/16 1042 09/07/16 0227 09/07/16 0844 09/07/16 1155 09/08/16 0256  NA 144 145  --  143 143  --  141  K 3.2* 4.0  --  2.9* 3.4*  --  4.2  CL 106 112*  --  106 108  --  106  CO2 27 18*  --  25 25  --  22  GLUCOSE 103* 95  --  103* 106*  --  93  BUN 21* 19  --  15 14  --  14  CREATININE 1.14 1.05  --  1.17 1.07 1.09 1.12  CALCIUM 10.0 10.0  --  9.9 9.7  --  9.7  MG 2.3  --  2.0  --   --   --   --   PHOS 2.7 2.4*  --   --   --   --   --    GFR: Estimated Creatinine Clearance: 78.5 mL/min (by C-G formula based on SCr of 1.12 mg/dL). Liver Function Tests:  Recent Labs Lab 09/08/16 0256  AST 59*  ALT 101*   ALKPHOS 88  BILITOT 1.8*  PROT 6.9  ALBUMIN 3.0*   No results for input(s): LIPASE, AMYLASE in the last 168 hours. No results for input(s): AMMONIA in the last 168 hours. Coagulation Profile:  Recent Labs Lab 09/07/16 0710  INR 1.13   Cardiac Enzymes: No results for input(s): CKTOTAL, CKMB, CKMBINDEX, TROPONINI in the last 168 hours. BNP (last 3 results) No results for input(s): PROBNP in the last 8760 hours. HbA1C: No results for input(s): HGBA1C in the last 72 hours. CBG:  Recent Labs Lab 09/08/16 0558 09/08/16 1153 09/08/16 1633 09/08/16 2115 09/09/16 0653  GLUCAP 125* 138* 110* 165* 138*   Lipid Profile:  Recent Labs  09/07/16 0844  CHOL 159  HDL 25*  LDLCALC 105*  TRIG 146  CHOLHDL 6.4   Thyroid Function Tests: No results for input(s): TSH, T4TOTAL, FREET4, T3FREE, THYROIDAB in the last 72 hours. Anemia Panel: No results for input(s): VITAMINB12, FOLATE, FERRITIN, TIBC, IRON, RETICCTPCT in the last 72 hours.  Sepsis Labs: No results for input(s): PROCALCITON, LATICACIDVEN in the last 168 hours.  No results found for this or any previous visit (from the past 240 hour(s)).       Radiology Studies: Dg Chest Port 1 View  Result Date: 09/09/2016 CLINICAL DATA:  Fever, cough for several months EXAM: PORTABLE CHEST 1 VIEW COMPARISON:  09/04/2016 FINDINGS: Cardiomegaly with vascular congestion. No confluent opacities or effusions. No acute bony abnormality. IMPRESSION: Cardiomegaly, vascular congestion. Electronically Signed   By: Rolm Baptise M.D.   On: 09/09/2016 08:18        Scheduled Meds: . amiodarone  400 mg Oral BID  . carvedilol  12.5 mg Oral BID WC  . chlorhexidine  15 mL Mouth Rinse BID  . docusate  100 mg Oral Daily  . famotidine  20 mg Oral BID  . furosemide  20 mg Oral Daily  . heparin  5,000 Units Subcutaneous Q8H  . insulin aspart  0-9 Units Subcutaneous TID WC  . levofloxacin (LEVAQUIN) IV  750 mg Intravenous Q0600  . mouth  rinse  15 mL Mouth Rinse q12n4p  . nitroGLYCERIN  1 inch Topical Q6H  . sodium chloride  flush  3 mL Intravenous Q12H   Continuous Infusions: . sodium chloride 10 mL/hr at 09/07/16 1530     LOS: 13 days       Phillips Climes, MD [facility] Pager [phone]  If 7PM-7AM, please contact night-coverage [email or web address] Password [email or web address] 09/09/2016, 11:13 AM

## 2016-09-09 NOTE — Progress Notes (Signed)
   09/09/16 0424  Vitals  Temp (!) 102.4 F (39.1 C)  Temp Source Oral  BP (!) 137/46  BP Location Right Arm  BP Method Automatic  Patient Position (if appropriate) Lying  Pulse Rate 78  Pulse Rate Source Dinamap  Resp 20  Oxygen Therapy  SpO2 97 %  O2 Device CPAP  Height and Weight  Weight 118 kg (260 lb 1.6 oz) (bedscale)  Type of Scale Used Bed  Pt's temp elevated, pt given 650mg  tylenol po. Walden Field NP made aware and ordered blood culture and urine culture.

## 2016-09-09 NOTE — Progress Notes (Signed)
Called by Macie Burows from the [employer] telesitter that patient's camera is not on advised to check if the quipment was plugged properly, went to patient's room and checked the equipment and it's properly plugged, pt resting in bed asleep, daughter at bedside. Portable paged several times but didn't call back. AC made aware and gave this RN another pager number, portable paged again but never called back. Family member was informed.

## 2016-09-10 DIAGNOSIS — R509 Fever, unspecified: Secondary | ICD-10-CM

## 2016-09-10 LAB — BLOOD CULTURE ID PANEL (REFLEXED)

## 2016-09-10 LAB — RESPIRATORY PANEL BY PCR

## 2016-09-10 LAB — CBC
HCT: 40.4 % (ref 39.0–52.0)
Hemoglobin: 12.6 g/dL — ABNORMAL LOW (ref 13.0–17.0)
MCH: 26.4 pg (ref 26.0–34.0)
MCHC: 31.2 g/dL (ref 30.0–36.0)
MCV: 84.7 fL (ref 78.0–100.0)
Platelets: 182 10*3/uL (ref 150–400)
RBC: 4.77 MIL/uL (ref 4.22–5.81)
RDW: 14.7 % (ref 11.5–15.5)
WBC: 14.3 10*3/uL — ABNORMAL HIGH (ref 4.0–10.5)

## 2016-09-10 LAB — GLUCOSE, CAPILLARY
Glucose-Capillary: 93 mg/dL (ref 65–99)
Glucose-Capillary: 96 mg/dL (ref 65–99)
Glucose-Capillary: 119 mg/dL — ABNORMAL HIGH (ref 65–99)

## 2016-09-10 LAB — BASIC METABOLIC PANEL
Anion gap: 11 (ref 5–15)
BUN: 14 mg/dL (ref 6–20)
CO2: 23 mmol/L (ref 22–32)
Calcium: 10.2 mg/dL (ref 8.9–10.3)
Chloride: 105 mmol/L (ref 101–111)
Creatinine, Ser: 1.23 mg/dL (ref 0.61–1.24)
GFR calc Af Amer: >60 mL/min (ref >60)
GFR calc non Af Amer: 59 mL/min — ABNORMAL LOW (ref >60)
Glucose, Bld: 97 mg/dL (ref 65–99)
Potassium: 3.6 mmol/L (ref 3.5–5.1)
Sodium: 139 mmol/L (ref 135–145)

## 2016-09-10 LAB — URINE CULTURE: Culture: NO GROWTH

## 2016-09-10 LAB — PROCALCITONIN: Procalcitonin: 1.03 ng/mL

## 2016-09-10 LAB — INFLUENZA PANEL BY PCR (TYPE A & B)
Influenza A By PCR: NEGATIVE
Influenza B By PCR: NEGATIVE

## 2016-09-10 MED ORDER — SODIUM CHLORIDE 0.9 % IV BOLUS (SEPSIS)
500.0000 mL | Freq: Once | INTRAVENOUS | Status: AC
  Administered 2016-09-10: 250 mL via INTRAVENOUS

## 2016-09-10 MED ORDER — PIPERACILLIN-TAZOBACTAM 3.375 G IVPB
3.3750 g | Freq: Three times a day (TID) | INTRAVENOUS | Status: DC
  Administered 2016-09-10 – 2016-09-13 (×12): 3.375 g via INTRAVENOUS
  Filled 2016-09-10 (×14): qty 50

## 2016-09-10 MED ORDER — POTASSIUM CHLORIDE CRYS ER 20 MEQ PO TBCR
40.0000 meq | EXTENDED_RELEASE_TABLET | Freq: Once | ORAL | Status: AC
  Administered 2016-09-10: 40 meq via ORAL
  Filled 2016-09-10: qty 2

## 2016-09-10 MED ORDER — PIPERACILLIN-TAZOBACTAM 3.375 G IVPB 30 MIN
3.3750 g | Freq: Once | INTRAVENOUS | Status: AC
  Administered 2016-09-10: 3.375 g via INTRAVENOUS
  Filled 2016-09-10: qty 50

## 2016-09-10 NOTE — Progress Notes (Signed)
Pt had temp of 102.2, this am gave Tylenol, temp was 101, last night as well, then went back to 99.7, MD aware will continue to monitor, Thanks Arvella Nigh RN

## 2016-09-10 NOTE — Progress Notes (Addendum)
PROGRESS NOTE  Dean [name]  [id number] DOB: 27-Jan-1951  DOA: 08/27/2016 PCP: [name], MD   Brief Narrative:  65 yo MO (310 lbs 5'5") AAM who was walking back from the bathroom 12/3 when he collapsed pulseless. CPR wa not performed by family and EMS arrived in 10 minutes and performed CPR x 21 minutes, 7 rounds of epi, 5 shocks, amiodarone and magnesium. He coded again with ROSC. [place] airway was changed by EDP. He was taken to CT scanner with NAD of brain, transfer to CCU and cardiology was called. Hypothermia protocol was initiated at 1545 after ct head. After clinical improvement and stabilization, care transferred from [facility] to [facility] on 09/06/16   SIGNIFICANT EVENTS: 12/3 Cardiac arrest  12/5 > Re-Warmed, off sedation  12/7 extubated  12/8 Acutely confused overnight, pulled CVC, ativan ordered. Mittens in place  12/9 - wife and daughter at bedside - feel encephalopathy is rapidly improving. sTill sleepy but much better. They think he is ready to eat. STill per RN can slip out of bed and needs siter. No on infusion. opulse ox 97% 2L [place]. 94% on RA x 2min  Assessment & Plan:   Principal Problem:   Cardiopulmonary arrest ([place]) Active Problems:   DM2 (diabetes mellitus, type 2) ([place])   Morbid obesity ([place])   Essential hypertension, benign   Acute encephalopathy   Acute respiratory failure ([place])   Uncomplicated asthma   Diabetes mellitus type 2 in obese (HCC)   Acute on chronic combined systolic and diastolic CHF (congestive heart failure) (HCC)   Tachypnea   Hypernatremia   Hypokalemia   Leukocytosis   Acute blood loss anemia   Hypoxemia   NSVT (nonsustained ventricular tachycardia) (HCC)   Chronic respiratory failure with hypoxia (HCC)   Fever   Acute cystitis without hematuria   1. Status post cardiac arrest 08/27/16 with 31 minutes down: VT/VF arrest,? Secondary to respiratory process. Cardiology following. Underwent cardiac cath 12/14 and detailed results as below.  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 Medical management recommended by cardiology. EP plans ICD on 12/18 but due to ongoing febrile illness, may have to be postponed.. 2. NSVT: EP consultation appreciated. On coreg, Amiodarone started. For AICD 09/11/16. Attempt to keep K>4, Mg > 2. 3. Acute combine Systolic & Diastolic CHF/New cardiomyopathy: -3.970 L since admission. Cardiology has switched metoprolol to carvedilol. IV Lasix transitioned to oral Lasix. Plans to add an ARB if renal function stable post cath with ultimate plan to transition to [facility]. Compensated. 4. Acute respiratory failure with hypoxia: Extubated 12/7. Hypoxia resolved. 5. Acute kidney injury: Resolved. 6. Hypokalemia: Attempt to keep potassium >4 and magnesium >2  7. Dysphagia: Speech therapy reevaluated and recommended dysphagia 2 diet. 8. DM type II: Continue SSI. Good inpatient control. 9. Essential hypertension: controlled. Currently on NTP transdermal and beta blockers changed to carvedilol. ARB if renal functions remain stable. 10. Acute encephalopathy: Agitation/delirium versus anoxic injury. CT head negative. Avoid sedative and QT prolonging medications. Precedex discontinued 09/05/16. Improving.  11. History of asthma: Stable. Status post extubation 08/31/16. As per family at bedside on 12/14, mental status gradually improving. 12. Prolonged QTC: 508 ms on 09/02/16 and down to 473 ms on 09/04/16. 13. Febrile illness: Patient has been spiking high fevers up to 102.4 since 12/15. Etiology unclear. Denies urinary symptoms but UA positive. Denies any other symptoms suggestive of source. Chest x-ray negative for pneumonia.  Despite IV levofloxacin for 48 hours, continued to spike fevers. Changed to IV Zosyn on 12/17. Pro-calcitonin 1.03. Urine culture negative. Discontinue levofloxacin. Blood cultures 2 from 09/09/16:  Pending. Check flu panel and RSV. Clinically does not look septic. Does have mild leukocytosis. 14. ? OSA: Has been on CPAP at bedtime.    DVT prophylaxis: Heparin Code Status: Full Family Communication: None at bedside Disposition Plan: PT is recommending CIR, consult requested   Consultants:   CCM-signed off  Cardiology/EP  Procedures:  12/3 ET>>12/7 12/3 left I J CVL>>12/8 2-D echo 08/28/16: Study Conclusions  - Left ventricle: The cavity size was mildly dilated. Wall   thickness was increased in a pattern of severe LVH. Systolic   function was moderately reduced. The estimated ejection fraction   was in the range of 35% to 40%. Akinesis of the basalinferior and   apical myocardium. Features are consistent with a pseudonormal   left ventricular filling pattern, with concomitant abnormal   relaxation and increased filling pressure (grade 2 diastolic   dysfunction). - Aortic valve: There was mild regurgitation. - Mitral valve: There was mild regurgitation. - Left atrium: The atrium was severely dilated.   Left Heart Cath and Coronary Angiography  Conclusion     There is moderate left ventricular systolic dysfunction.  LV end diastolic pressure is normal.  The left ventricular ejection fraction is 35-45% by visual estimate.  Ost RCA lesion, 90 %stenosed.  Mid RCA to Dist RCA lesion, 100 %stenosed.   1. Single vessel occlusive CAD. The distal RCA is occluded with left to right collaterals. There is a 90% ostial stenosis 2. Moderate LV dysfunction with EF 35-40%. Akinesis of the basal to mid inferior wall 3. Normal LVEDP  Plan: medical therapy. If he has significant angina we could consider PCI of the RCA but there appears to be significant scar of the inferior wall.       Antimicrobials:   IV Levofloxacin 12/15> 12/17  IV Zosyn 12/17>   Subjective: Ongoing fevers. Seen having mild intermittent nonproductive cough. Denies dysuria or urinary  frequency. As per RN, no bedsores noted. No diarrhea or leg pains.   Objective:  Vitals:   09/09/16 2350 09/10/16 0608 09/10/16 0723 09/10/16 0839  BP: (!) 128/29 (!) 122/42    Pulse: 67 77    Resp: 20 18    Temp: 97.5 F (36.4 C) (!) 102.2 F (39 C) (!) 101.3 F (38.5 C) 99 F (37.2 C)  TempSrc: Axillary Oral Oral Oral  SpO2: 98% 97%    Weight:  115.6 kg (254 lb 12.8 oz)    Height:        Intake/Output Summary (Last 24 hours) at 09/10/16 1023 Last data filed at 09/10/16 0955  Gross per 24 hour  Intake              620 ml  Output              251 ml  Net              369 ml   Filed Weights   09/08/16 0431 09/09/16 0424 09/10/16 [id number]  Weight: 120.7 kg (266 lb) 118 kg (260 lb 1.6 oz) 115.6 kg (254 lb 12.8 oz)    Examination:  General exam: Pleasant middle-aged male sitting up comfortably In bed seen eating breakfast by himself. Does not look septic or toxic. Respiratory system: clear to auscultation. Respiratory effort normal. Cardiovascular system: S1 & S2 heard, RRR. No JVD, murmurs, rubs,  gallops or clicks. No pedal edema. Telemetry: Sinus rhythm.  No further episodes of NSVT in the last 48 hours.  Gastrointestinal system: Abdomen is nondistended, soft and nontender. No organomegaly or masses felt. Normal bowel sounds heard. Central nervous system: Alert and oriented to person,  place and partly to time (December 2017) . No focal neurological deficits. Extremities: Symmetric 5 x 5 power. Skin: No rashes, lesions or ulcers Psychiatry: Judgement and insight impaired . Mood & affect appropriate.     Data Reviewed: I have personally reviewed following labs and imaging studies  CBC:  Recent Labs Lab 09/05/16 0255 09/06/16 0431 09/07/16 1155 09/10/16 0444  WBC 11.3* 14.3* 13.3* 14.3*  HGB 12.3* 13.0 11.9* 12.6*  HCT 39.4 41.5 37.8* 40.4  MCV 85.1 84.7 84.8 84.7  PLT 243 227 180 [id number]   Basic Metabolic Panel:  Recent Labs Lab 09/05/16 0255 09/06/16 0431  09/06/16 1042 09/07/16 0227 09/07/16 0844 09/07/16 1155 09/08/16 0256 09/10/16 0444  NA 144 145  --  143 143  --  141 139  K 3.2* 4.0  --  2.9* 3.4*  --  4.2 3.6  CL 106 112*  --  106 108  --  106 105  CO2 27 18*  --  25 25  --  22 23  GLUCOSE 103* 95  --  103* 106*  --  93 97  BUN 21* 19  --  15 14  --  14 14  CREATININE 1.14 1.05  --  1.17 1.07 1.09 1.12 1.23  CALCIUM 10.0 10.0  --  9.9 9.7  --  9.7 10.2  MG 2.3  --  2.0  --   --   --   --   --   PHOS 2.7 2.4*  --   --   --   --   --   --    GFR: Estimated Creatinine Clearance: 70.6 mL/min (by C-G formula based on SCr of 1.23 mg/dL). Liver Function Tests:  Recent Labs Lab 09/08/16 0256  AST 59*  ALT 101*  ALKPHOS 88  BILITOT 1.8*  PROT 6.9  ALBUMIN 3.0*   No results for input(s): LIPASE, AMYLASE in the last 168 hours. No results for input(s): AMMONIA in the last 168 hours. Coagulation Profile:  Recent Labs Lab 09/07/16 0710  INR 1.13   Cardiac Enzymes: No results for input(s): CKTOTAL, CKMB, CKMBINDEX, TROPONINI in the last 168 hours. BNP (last 3 results) No results for input(s): PROBNP in the last 8760 hours. HbA1C: No results for input(s): HGBA1C in the last 72 hours. CBG:  Recent Labs Lab 09/09/16 0653 09/09/16 1125 09/09/16 1653 09/09/16 2100 09/10/16 0539  GLUCAP 138* 121* 116* 119* 93   Lipid Profile: No results for input(s): CHOL, HDL, LDLCALC, TRIG, CHOLHDL, LDLDIRECT in the last 72 hours. Thyroid Function Tests: No results for input(s): TSH, T4TOTAL, FREET4, T3FREE, THYROIDAB in the last 72 hours. Anemia Panel: No results for input(s): VITAMINB12, FOLATE, FERRITIN, TIBC, IRON, RETICCTPCT in the last 72 hours.  Sepsis Labs:  Recent Labs Lab 09/10/16 0708  PROCALCITON 1.03    Recent Results (from the past 240 hour(s))  Culture, Urine     Status: None   Collection Time: 09/09/16  8:11 AM  Result Value Ref Range Status   Specimen Description URINE, RANDOM  Final   Special Requests  NONE  Final   Culture NO GROWTH  Final   Report Status 09/10/2016 FINAL  Final         Radiology  Studies: Dg Chest Port 1 View  Result Date: 09/09/2016 CLINICAL DATA:  Fever, cough for several months EXAM: PORTABLE CHEST 1 VIEW COMPARISON:  09/04/2016 FINDINGS: Cardiomegaly with vascular congestion. No confluent opacities or effusions. No acute bony abnormality. IMPRESSION: Cardiomegaly, vascular congestion. Electronically Signed   By: Rolm Baptise M.D.   On: 09/09/2016 08:18        Scheduled Meds: . amiodarone  400 mg Oral BID  . carvedilol  12.5 mg Oral BID WC  . chlorhexidine  15 mL Mouth Rinse BID  . docusate  100 mg Oral Daily  . famotidine  20 mg Oral BID  . furosemide  20 mg Oral Daily  . heparin  5,000 Units Subcutaneous Q8H  . insulin aspart  0-9 Units Subcutaneous TID WC  . mouth rinse  15 mL Mouth Rinse q12n4p  . nitroGLYCERIN  1 inch Topical Q6H  . piperacillin-tazobactam (ZOSYN)  IV  3.375 g Intravenous Q8H  . potassium chloride  40 mEq Oral Once  . sodium chloride flush  3 mL Intravenous Q12H   Continuous Infusions:    LOS: 14 days       Jeanice Dempsey, MD [facility] Pager [phone]  If 7PM-7AM, please contact night-coverage [email or web address] Password [email or web address] 09/10/2016, 10:23 AM

## 2016-09-10 NOTE — Progress Notes (Signed)
Patient Name: Dean [name] Date of Encounter: 09/10/2016  Primary Cardiologist: Dr. [facility] Problem List     Principal Problem:   Cardiopulmonary arrest [facility]) Active Problems:   DM2 (diabetes mellitus, type 2) ([place])   Morbid obesity ([place])   Essential hypertension, benign   Acute encephalopathy   Acute respiratory failure ([place])   Uncomplicated asthma   Diabetes mellitus type 2 in obese (HCC)   Acute on chronic combined systolic and diastolic CHF (congestive heart failure) (HCC)   Tachypnea   Hypernatremia   Hypokalemia   Leukocytosis   Acute blood loss anemia   Hypoxemia   NSVT (nonsustained ventricular tachycardia) (HCC)   Chronic respiratory failure with hypoxia (HCC)   Fever   Acute cystitis without hematuria   65 y.o. male with PMHx of obesity, DM II, and HTN, admitted to [facility] 08/27/16 after a witness cardiac arrest at home  Subjective   He was easily awakened this am.  Much more alert today No CP or dyspnea   Inpatient Medications    Scheduled Meds: . amiodarone  400 mg Oral BID  . carvedilol  12.5 mg Oral BID WC  . chlorhexidine  15 mL Mouth Rinse BID  . docusate  100 mg Oral Daily  . famotidine  20 mg Oral BID  . furosemide  20 mg Oral Daily  . heparin  5,000 Units Subcutaneous Q8H  . insulin aspart  0-9 Units Subcutaneous TID WC  . levofloxacin (LEVAQUIN) IV  750 mg Intravenous Q0600  . mouth rinse  15 mL Mouth Rinse q12n4p  . nitroGLYCERIN  1 inch Topical Q6H  . piperacillin-tazobactam (ZOSYN)  IV  3.375 g Intravenous Q8H  . sodium chloride flush  3 mL Intravenous Q12H   Continuous Infusions: . sodium chloride 10 mL/hr at 09/07/16 1530   PRN Meds: sodium chloride, sodium chloride, acetaminophen, hydrALAZINE, ondansetron (ZOFRAN) IV, RESOURCE THICKENUP CLEAR, sodium chloride flush, sodium chloride flush   Vital Signs    Vitals:   09/09/16 2350 09/10/16 0608 09/10/16 0723 09/10/16 0839  BP: (!) 128/29 (!) 122/42    Pulse: 67 77      Resp: 20 18    Temp: 97.5 F (36.4 C) (!) 102.2 F (39 C) (!) 101.3 F (38.5 C) 99 F (37.2 C)  TempSrc: Axillary Oral Oral Oral  SpO2: 98% 97%    Weight:  115.6 kg (254 lb 12.8 oz)    Height:        Intake/Output Summary (Last 24 hours) at 09/10/16 1013 Last data filed at 09/10/16 0955  Gross per 24 hour  Intake              620 ml  Output              251 ml  Net              369 ml   Filed Weights   09/08/16 0431 09/09/16 0424 09/10/16 0608  Weight: 120.7 kg (266 lb) 118 kg (260 lb 1.6 oz) 115.6 kg (254 lb 12.8 oz)    Physical Exam   GEN: Obese gentleman he is currently sleeping in bed. HEENT: Grossly normal.  Neck: Supple, no JVD, carotid bruits, or masses. Cardiac: RRR, no murmurs, rubs, or gallops. No clubbing, cyanosis, edema.  Radials/DP/PT 2+ and equal bilaterally.  Respiratory:  Respirations regular and unlabored, GI: Soft, nontender, nondistended, BS + x 4. obese MS: no deformity or atrophy. No right wrist hematoma Skin: warm and dry, no  rash. Neuro:  Unable to assess Psych: Unable to assess  Labs    CBC  Recent Labs  09/07/16 1155 09/10/16 0444  WBC 13.3* 14.3*  HGB 11.9* 12.6*  HCT 37.8* 40.4  MCV 84.8 84.7  PLT 180 [id number]   Basic Metabolic Panel  Recent Labs  09/08/16 0256 09/10/16 0444  NA 141 139  K 4.2 3.6  CL 106 105  CO2 22 23  GLUCOSE 93 97  BUN 14 14  CREATININE 1.12 1.23  CALCIUM 9.7 10.2   Liver Function Tests  Recent Labs  09/08/16 0256  AST 59*  ALT 101*  ALKPHOS 88  BILITOT 1.8*  PROT 6.9  ALBUMIN 3.0*   No results for input(s): LIPASE, AMYLASE in the last 72 hours. Cardiac Enzymes No results for input(s): CKTOTAL, CKMB, CKMBINDEX, TROPONINI in the last 72 hours. BNP Invalid input(s): POCBNP D-Dimer No results for input(s): DDIMER in the last 72 hours. Hemoglobin A1C No results for input(s): HGBA1C in the last 72 hours. Fasting Lipid Panel No results for input(s): CHOL, HDL, LDLCALC, TRIG, CHOLHDL,  LDLDIRECT in the last 72 hours. Thyroid Function Tests No results for input(s): TSH, T4TOTAL, T3FREE, THYROIDAB in the last 72 hours.  Invalid input(s): FREET3  Telemetry    NSR - Personally Reviewed  ECG      Radiology    Dg Chest Port 1 View  Result Date: 09/09/2016 CLINICAL DATA:  Fever, cough for several months EXAM: PORTABLE CHEST 1 VIEW COMPARISON:  09/04/2016 FINDINGS: Cardiomegaly with vascular congestion. No confluent opacities or effusions. No acute bony abnormality. IMPRESSION: Cardiomegaly, vascular congestion. Electronically Signed   By: Rolm Baptise M.D.   On: 09/09/2016 08:18    Cardiac Studies   Cath with CTO RCA, likely inferior scar  Patient Profile     65 y/o with cardiac arrrest and now NSVT.  Assessment & Plan    1) No angina.  Plan to manage CAD medically.  EP has seen and plans AICD.  Continue ACE-I and beta blocker for LV dysfunction.   Amio for NSVT.   2. Altered mental status:   ? Encephalopathy following his cardiac arrest. Further management per the internal medicine team. He is much more awake and alert today compared to yesterday. We'll anticipate placing an ICD later this week.  Signed, Mertie Moores, MD  09/10/2016, 10:13 AM

## 2016-09-10 NOTE — Progress Notes (Signed)
Pharmacy Antibiotic Note  Dean [name] is a 65 y.o. male admitted on 08/27/2016 with cardiac arrest, now w/ UTI and persistent fevers despite Levaquin.  Pharmacy has been consulted for Zosyn dosing.  Plan: Zosyn 3.375g IV q8h (4 hour infusion).  Height: 5\' 6"  (167.6 cm) Weight: 254 lb 12.8 oz (115.6 kg) IBW/kg (Calculated) : 63.8  Temp (24hrs), Avg:99.8 F (37.7 C), Min:97.5 F (36.4 C), Max:102.2 F (39 C)   Recent Labs Lab 09/05/16 0255 09/06/16 0431 09/07/16 0227 09/07/16 0844 09/07/16 1155 09/08/16 0256 09/10/16 0444  WBC 11.3* 14.3*  --   --  13.3*  --  14.3*  CREATININE 1.14 1.05 1.17 1.07 1.09 1.12 1.23    Estimated Creatinine Clearance: 70.6 mL/min (by C-G formula based on SCr of 1.23 mg/dL).    No Known Allergies  Antimicrobials this admission: Vanc 12/3 >> 12/4 Zosyn 12/3 >> 12/4 Levaquin 12/16 >>  Zosyn 12/17 >>   Microbiology results:   12/3 BCx: neg 12/3 MRSA PCR: neg 12/16 Urine >>  12/16 Blood x 2 >>  Thank you for allowing pharmacy to be a part of this patient's care.  Wynona Neat, PharmD, BCPS  09/10/2016 7:00 AM

## 2016-09-10 NOTE — Progress Notes (Signed)
PHARMACY - PHYSICIAN COMMUNICATION CRITICAL VALUE ALERT - BLOOD CULTURE IDENTIFICATION (BCID)  Results for orders placed or performed during the hospital encounter of 08/27/16  Blood Culture ID Panel (Reflexed) (Collected: 09/09/2016  5:20 AM)  Result Value Ref Range   Enterococcus species NOT DETECTED NOT DETECTED   Listeria monocytogenes NOT DETECTED NOT DETECTED   Staphylococcus species NOT DETECTED NOT DETECTED   Staphylococcus aureus NOT DETECTED NOT DETECTED   Streptococcus species NOT DETECTED NOT DETECTED   Streptococcus agalactiae NOT DETECTED NOT DETECTED   Streptococcus pneumoniae NOT DETECTED NOT DETECTED   Streptococcus pyogenes NOT DETECTED NOT DETECTED   Acinetobacter baumannii NOT DETECTED NOT DETECTED   Enterobacteriaceae species DETECTED (A) NOT DETECTED   Enterobacter cloacae complex NOT DETECTED NOT DETECTED   Escherichia coli NOT DETECTED NOT DETECTED   Klebsiella oxytoca NOT DETECTED NOT DETECTED   Klebsiella pneumoniae NOT DETECTED NOT DETECTED   Proteus species NOT DETECTED NOT DETECTED   Serratia marcescens NOT DETECTED NOT DETECTED   Carbapenem resistance NOT DETECTED NOT DETECTED   Haemophilus influenzae NOT DETECTED NOT DETECTED   Neisseria meningitidis NOT DETECTED NOT DETECTED   Pseudomonas aeruginosa NOT DETECTED NOT DETECTED   Candida albicans NOT DETECTED NOT DETECTED   Candida glabrata NOT DETECTED NOT DETECTED   Candida krusei NOT DETECTED NOT DETECTED   Candida parapsilosis NOT DETECTED NOT DETECTED   Candida tropicalis NOT DETECTED NOT DETECTED    Enterobacteriaceae no specific organism detected.  Changes to prescribed antibiotics required: Continue zosyn 3.375 g IV q8h   [name] 09/10/2016  12:46 PM

## 2016-09-11 DIAGNOSIS — R7881 Bacteremia: Secondary | ICD-10-CM

## 2016-09-11 LAB — GLUCOSE, CAPILLARY
Glucose-Capillary: 126 mg/dL — ABNORMAL HIGH (ref 65–99)
Glucose-Capillary: 113 mg/dL — ABNORMAL HIGH (ref 65–99)
Glucose-Capillary: 152 mg/dL — ABNORMAL HIGH (ref 65–99)
Glucose-Capillary: 115 mg/dL — ABNORMAL HIGH (ref 65–99)
Glucose-Capillary: 118 mg/dL — ABNORMAL HIGH (ref 65–99)

## 2016-09-11 NOTE — Care Management Important Message (Signed)
Important Message  Patient Details  Name: Dean [name] MRN: [id number] Date of Birth: [birth year]   Medicare Important Message Given:  Yes    [name] 09/11/2016, 11:30 AM

## 2016-09-11 NOTE — Progress Notes (Signed)
PROGRESS NOTE  Dean [name]  [id number] DOB: October 31, 1950  DOA: 08/27/2016 PCP: [name], MD   Brief Narrative:  65 yo MO (310 lbs 5'5") AAM who was walking back from the bathroom 12/3 when he collapsed pulseless. CPR wa not performed by family and EMS arrived in 10 minutes and performed CPR x 21 minutes, 7 rounds of epi, 5 shocks, amiodarone and magnesium. He coded again with ROSC. King airway was changed by EDP. He was taken to CT scanner with NAD of brain, transfer to CCU and cardiology was called. Hypothermia protocol was initiated at 1545 after ct head. After clinical improvement and stabilization, care transferred from [facility] to [facility] on 09/06/16. Clinically improved >CHF compensated, hypoxia resolved, mental status steadily improving. Status post cath. EP plans ICD insertion once fever/bacteremia adequately treated.   SIGNIFICANT EVENTS: 12/3 Cardiac arrest  12/5 > Re-Warmed, off sedation  12/7 extubated  12/8 Acutely confused overnight, pulled CVC, ativan ordered. Mittens in place  12/9 - wife and daughter at bedside - feel encephalopathy is rapidly improving. sTill sleepy but much better. They think he is ready to eat. STill per RN can slip out of bed and needs siter. No on infusion. opulse ox 97% 2L [place]. 94% on RA x 49min  Assessment & Plan:   Principal Problem:   Cardiopulmonary arrest ([place]) Active Problems:   DM2 (diabetes mellitus, type 2) ([place])   Morbid obesity ([place])   Essential hypertension, benign   Acute encephalopathy   Acute respiratory failure ([place])   Uncomplicated asthma   Diabetes mellitus type 2 in obese (HCC)   Acute on chronic combined systolic and diastolic CHF (congestive heart failure) (HCC)   Tachypnea   Hypernatremia   Hypokalemia   Leukocytosis   Acute blood loss anemia   Hypoxemia   NSVT (nonsustained ventricular tachycardia) (HCC)   Chronic respiratory failure with hypoxia (HCC)   Fever   Acute cystitis without hematuria   1. Status post  cardiac arrest 08/27/16 with 31 minutes down: VT/VF arrest,? Secondary to respiratory process. Cardiology following. Underwent cardiac cath 12/14 and detailed results as below. 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 Medical management recommended by cardiology. EP follow-up appreciated discussed with them. ICD implant for 12/18 postponed until surveillance blood cultures negative and 24 hours without fever. Request surveillance blood cultures on 12/19. 2. NSVT: EP consultation appreciated. On coreg, Amiodarone started. AICD when able. Attempt to keep K>4, Mg > 2. Improved after initiation of amiodarone. 3. Acute combine Systolic & Diastolic CHF/New cardiomyopathy: -3.4 L since admission. Cardiology has switched metoprolol to carvedilol. IV Lasix transitioned to oral Lasix. Plans to add an ARB if renal function stable post cath with ultimate plan to transition to [facility]. Compensated. Cardiology to advise if patient will be switched from NTP to oral nitrates and decision regarding ARB. 4. Acute respiratory failure with hypoxia: Extubated 12/7. Hypoxia resolved. 5. Acute kidney injury: Resolved. 6. Hypokalemia: Attempt to keep potassium >4 and magnesium >2  7. Dysphagia: Speech therapy reevaluated and recommended dysphagia 2 diet. 8. DM type II: Continue SSI. Good inpatient control. 9. Essential hypertension: controlled. Currently on NTP transdermal and beta blockers changed to carvedilol.  10. Acute encephalopathy: Agitation/delirium versus anoxic injury. CT head negative. Avoid sedative and QT prolonging medications. Precedex discontinued 09/05/16. Improving. As per daughter at bedside on 12/18, mental status has improved and almost close to baseline. 11. History of asthma:  Stable. Status post extubation 08/31/16. As per family at bedside on 12/14, mental status gradually improving. 12. Prolonged  QTC: 508 ms on 09/02/16 and down to 473 ms on 09/04/16. 13. Febrile illness/gram-negative rod bacteremia: Patient had been spiking high fevers up to 102.4 since 12/15. Denies urinary symptoms, UA positive but urine culture negative. Denies any other symptoms suggestive of source. Chest x-ray negative for pneumonia. Despite IV levofloxacin for 48 hours, continued to spike fevers. Changed to IV Zosyn on 12/17. Pro-calcitonin 1.03. Discontinued levofloxacin. One of 2 blood cultures from 09/09/16 shows gram-negative rods and BCID shows Enterobacteriaceae species. Flu panel and RSV panel negative. Clinically does not look septic. Does have mild leukocytosis. Source of bacteremia not clear. Repeat surveillance blood cultures on 12/19. If continues to have ongoing fevers, consider ID consultation. 14. ? OSA: Has been on CPAP at bedtime.    DVT prophylaxis: Heparin Code Status: Full Family Communication: Discussed in detail with patient's daughter at bedside on 09/11/16. Disposition Plan: PT is recommending CIR, consult requested   Consultants:   CCM-signed off  Cardiology/EP  Procedures:  12/3 ET>>12/7 12/3 left I J CVL>>12/8 2-D echo 08/28/16: Study Conclusions  - Left ventricle: The cavity size was mildly dilated. Wall   thickness was increased in a pattern of severe LVH. Systolic   function was moderately reduced. The estimated ejection fraction   was in the range of 35% to 40%. Akinesis of the basalinferior and   apical myocardium. Features are consistent with a pseudonormal   left ventricular filling pattern, with concomitant abnormal   relaxation and increased filling pressure (grade 2 diastolic   dysfunction). - Aortic valve: There was mild regurgitation. - Mitral valve: There was mild regurgitation. - Left atrium: The atrium was severely dilated.   Left Heart Cath and Coronary Angiography  Conclusion     There is moderate left ventricular systolic dysfunction.  LV end  diastolic pressure is normal.  The left ventricular ejection fraction is 35-45% by visual estimate.  Ost RCA lesion, 90 %stenosed.  Mid RCA to Dist RCA lesion, 100 %stenosed.   1. Single vessel occlusive CAD. The distal RCA is occluded with left to right collaterals. There is a 90% ostial stenosis 2. Moderate LV dysfunction with EF 35-40%. Akinesis of the basal to mid inferior wall 3. Normal LVEDP  Plan: medical therapy. If he has significant angina we could consider PCI of the RCA but there appears to be significant scar of the inferior wall.       Antimicrobials:   IV Levofloxacin 12/15> 12/17  IV Zosyn 12/17>   Subjective: Seen this morning. Denied complaints. As per daughter at bedside, mental status is almost returned to baseline. Last fever spike 102.71F at 7:20 PM on 12/17..   Objective:  Vitals:   09/10/16 2135 09/10/16 2354 09/11/16 0200 09/11/16 0516  BP: (!) 134/45   (!) 138/37  Pulse: 62   76  Resp:    18  Temp: 99.5 F (37.5 C) 98.7 F (37.1 C) 98.4 F (36.9 C) 98.5 F (36.9 C)  TempSrc: Oral Oral Oral Oral  SpO2:    98%  Weight:    116 kg (255 lb 11.2 oz)  Height:        Intake/Output Summary (Last 24 hours) at 09/11/16 1041 Last data filed at 09/11/16 1032  Gross per 24 hour  Intake             1025 ml  Output  1 ml  Net             1024 ml   Filed Weights   09/09/16 0424 09/10/16 0608 09/11/16 0516  Weight: 118 kg (260 lb 1.6 oz) 115.6 kg (254 lb 12.8 oz) 116 kg (255 lb 11.2 oz)    Examination:  General exam: Pleasant middle-aged male sitting up comfortably In bed. Does not look septic or toxic. Respiratory system: clear to auscultation. Respiratory effort normal. Cardiovascular system: S1 & S2 heard, RRR. No JVD, murmurs, rubs, gallops or clicks. No pedal edema. Telemetry: Sinus rhythm.  No further episodes of NSVT in the last 72 hours.  Gastrointestinal system: Abdomen is nondistended, soft and nontender. No organomegaly  or masses felt. Normal bowel sounds heard. Central nervous system: Alert and oriented to person, place and partly to time (December 2017) . No focal neurological deficits. Extremities: Symmetric 5 x 5 power. Skin: No rashes, lesions or ulcers Psychiatry: Judgement and insight impaired . Mood & affect appropriate.     Data Reviewed: I have personally reviewed following labs and imaging studies  CBC:  Recent Labs Lab 09/05/16 0255 09/06/16 0431 09/07/16 1155 09/10/16 0444  WBC 11.3* 14.3* 13.3* 14.3*  HGB 12.3* 13.0 11.9* 12.6*  HCT 39.4 41.5 37.8* 40.4  MCV 85.1 84.7 84.8 84.7  PLT 243 227 180 [id number]   Basic Metabolic Panel:  Recent Labs Lab 09/05/16 0255 09/06/16 0431 09/06/16 1042 09/07/16 0227 09/07/16 0844 09/07/16 1155 09/08/16 0256 09/10/16 0444  NA 144 145  --  143 143  --  141 139  K 3.2* 4.0  --  2.9* 3.4*  --  4.2 3.6  CL 106 112*  --  106 108  --  106 105  CO2 27 18*  --  25 25  --  22 23  GLUCOSE 103* 95  --  103* 106*  --  93 97  BUN 21* 19  --  15 14  --  14 14  CREATININE 1.14 1.05  --  1.17 1.07 1.09 1.12 1.23  CALCIUM 10.0 10.0  --  9.9 9.7  --  9.7 10.2  MG 2.3  --  2.0  --   --   --   --   --   PHOS 2.7 2.4*  --   --   --   --   --   --    GFR: Estimated Creatinine Clearance: 70.8 mL/min (by C-G formula based on SCr of 1.23 mg/dL). Liver Function Tests:  Recent Labs Lab 09/08/16 0256  AST 59*  ALT 101*  ALKPHOS 88  BILITOT 1.8*  PROT 6.9  ALBUMIN 3.0*   No results for input(s): LIPASE, AMYLASE in the last 168 hours. No results for input(s): AMMONIA in the last 168 hours. Coagulation Profile:  Recent Labs Lab 09/07/16 0710  INR 1.13   Cardiac Enzymes: No results for input(s): CKTOTAL, CKMB, CKMBINDEX, TROPONINI in the last 168 hours. BNP (last 3 results) No results for input(s): PROBNP in the last 8760 hours. HbA1C: No results for input(s): HGBA1C in the last 72 hours. CBG:  Recent Labs Lab 09/10/16 0539 09/10/16 1123  09/10/16 1622 09/10/16 2208 09/11/16 0555  GLUCAP 93 126* 96 119* 113*   Lipid Profile: No results for input(s): CHOL, HDL, LDLCALC, TRIG, CHOLHDL, LDLDIRECT in the last 72 hours. Thyroid Function Tests: No results for input(s): TSH, T4TOTAL, FREET4, T3FREE, THYROIDAB in the last 72 hours. Anemia Panel: No results for input(s): VITAMINB12, FOLATE, FERRITIN, TIBC, IRON, RETICCTPCT  in the last 72 hours.  Sepsis Labs:  Recent Labs Lab 09/10/16 0708  PROCALCITON 1.03    Recent Results (from the past 240 hour(s))  Culture, blood (routine x 2)     Status: None (Preliminary result)   Collection Time: 09/09/16  5:20 AM  Result Value Ref Range Status   Specimen Description BLOOD RIGHT HAND  Final   Special Requests BOTTLES DRAWN AEROBIC ONLY 6CC  Final   Culture  Setup Time   Final    GRAM NEGATIVE RODS AEROBIC BOTTLE ONLY CRITICAL RESULT CALLED TO, READ BACK BY AND VERIFIED WITH: A MASTERS,PHARMD AT 1244 09/10/16 BY L BENFIELD    Culture GRAM NEGATIVE RODS  Final   Report Status PENDING  Incomplete  Blood Culture ID Panel (Reflexed)     Status: Abnormal   Collection Time: 09/09/16  5:20 AM  Result Value Ref Range Status   Enterococcus species NOT DETECTED NOT DETECTED Final   Listeria monocytogenes NOT DETECTED NOT DETECTED Final   Staphylococcus species NOT DETECTED NOT DETECTED Final   Staphylococcus aureus NOT DETECTED NOT DETECTED Final   Streptococcus species NOT DETECTED NOT DETECTED Final   Streptococcus agalactiae NOT DETECTED NOT DETECTED Final   Streptococcus pneumoniae NOT DETECTED NOT DETECTED Final   Streptococcus pyogenes NOT DETECTED NOT DETECTED Final   Acinetobacter baumannii NOT DETECTED NOT DETECTED Final   Enterobacteriaceae species DETECTED (A) NOT DETECTED Final    Comment: CRITICAL RESULT CALLED TO, READ BACK BY AND VERIFIED WITH: A MASTERS,PHARMD AT 1244 09/10/16 BY L BENFIELD    Enterobacter cloacae complex NOT DETECTED NOT DETECTED Final    Escherichia coli NOT DETECTED NOT DETECTED Final   Klebsiella oxytoca NOT DETECTED NOT DETECTED Final   Klebsiella pneumoniae NOT DETECTED NOT DETECTED Final   Proteus species NOT DETECTED NOT DETECTED Final   Serratia marcescens NOT DETECTED NOT DETECTED Final   Carbapenem resistance NOT DETECTED NOT DETECTED Final   Haemophilus influenzae NOT DETECTED NOT DETECTED Final   Neisseria meningitidis NOT DETECTED NOT DETECTED Final   Pseudomonas aeruginosa NOT DETECTED NOT DETECTED Final   Candida albicans NOT DETECTED NOT DETECTED Final   Candida glabrata NOT DETECTED NOT DETECTED Final   Candida krusei NOT DETECTED NOT DETECTED Final   Candida parapsilosis NOT DETECTED NOT DETECTED Final   Candida tropicalis NOT DETECTED NOT DETECTED Final  Culture, blood (routine x 2)     Status: None (Preliminary result)   Collection Time: 09/09/16  5:50 AM  Result Value Ref Range Status   Specimen Description BLOOD RIGHT HAND  Final   Special Requests IN PEDIATRIC BOTTLE 2CC  Final   Culture NO GROWTH 1 DAY  Final   Report Status PENDING  Incomplete  Culture, Urine     Status: None   Collection Time: 09/09/16  8:11 AM  Result Value Ref Range Status   Specimen Description URINE, RANDOM  Final   Special Requests NONE  Final   Culture NO GROWTH  Final   Report Status 09/10/2016 FINAL  Final  Respiratory Panel by PCR     Status: None   Collection Time: 09/10/16 10:46 AM  Result Value Ref Range Status   Adenovirus NOT DETECTED NOT DETECTED Final   Coronavirus 229E NOT DETECTED NOT DETECTED Final   Coronavirus HKU1 NOT DETECTED NOT DETECTED Final   Coronavirus NL63 NOT DETECTED NOT DETECTED Final   Coronavirus OC43 NOT DETECTED NOT DETECTED Final   Metapneumovirus NOT DETECTED NOT DETECTED Final   Rhinovirus / Enterovirus  NOT DETECTED NOT DETECTED Final   Influenza A NOT DETECTED NOT DETECTED Final   Influenza B NOT DETECTED NOT DETECTED Final   Parainfluenza Virus 1 NOT DETECTED NOT DETECTED Final    Parainfluenza Virus 2 NOT DETECTED NOT DETECTED Final   Parainfluenza Virus 3 NOT DETECTED NOT DETECTED Final   Parainfluenza Virus 4 NOT DETECTED NOT DETECTED Final   Respiratory Syncytial Virus NOT DETECTED NOT DETECTED Final   Bordetella pertussis NOT DETECTED NOT DETECTED Final   Chlamydophila pneumoniae NOT DETECTED NOT DETECTED Final   Mycoplasma pneumoniae NOT DETECTED NOT DETECTED Final         Radiology Studies: No results found.      Scheduled Meds: . amiodarone  400 mg Oral BID  . carvedilol  12.5 mg Oral BID WC  . chlorhexidine  15 mL Mouth Rinse BID  . docusate  100 mg Oral Daily  . famotidine  20 mg Oral BID  . furosemide  20 mg Oral Daily  . heparin  5,000 Units Subcutaneous Q8H  . insulin aspart  0-9 Units Subcutaneous TID WC  . mouth rinse  15 mL Mouth Rinse q12n4p  . nitroGLYCERIN  1 inch Topical Q6H  . piperacillin-tazobactam (ZOSYN)  IV  3.375 g Intravenous Q8H  . sodium chloride flush  3 mL Intravenous Q12H   Continuous Infusions:    LOS: 15 days       Sylvanus Telford, MD [facility] Pager [phone]  If 7PM-7AM, please contact night-coverage [email or web address] Password [email or web address] 09/11/2016, 10:41 AM

## 2016-09-11 NOTE — Progress Notes (Signed)
Physical Therapy Treatment Patient Details Name: Dean [name] MRN: [id number] DOB: 01-Aug-1951 Today's Date: 09/11/2016    History of Present Illness Pt is a 65 y.o. male with PMH of asthma, DM, gout, HTN who callapsed pulseless on 12/3. No CPR x 10 minutes then requiring CPR x 21 minutes, 7 rounds of epi, 5 shocks, amiodarone and magnesium, was intubated. EEG 12/4 was markedly abnormal wiht severe diffuse cerebral dysfunction. Pt was extubated 12/7,  PMH - asthma, DM, HTN, gout    PT Comments     Pt able to ambulate 75 ft X2 with rw and min assist for safety. Pt with decreased safety awareness, requiring repeated cues for safety considerations as well as maneuvering rw. Seated rest break taken due to fatigue after 75 ft. Continuing to recommend CIR for further rehabilitation following acute stay.   Follow Up Recommendations  CIR     Equipment Recommendations  Rolling walker with 5" wheels    Recommendations for Other Services       Precautions / Restrictions Precautions Precautions: Fall Restrictions Weight Bearing Restrictions: No    Mobility  Bed Mobility               General bed mobility comments: sitting in chair upon arrival  Transfers Overall transfer level: Needs assistance Equipment used: Rolling walker (2 wheeled) Transfers: Sit to/from Stand Sit to Stand: Min guard         General transfer comment: Despite cues, pt not using UEs for transfers. Performed from recliner and standard chair.   Ambulation/Gait Ambulation/Gait assistance: Min assist Ambulation Distance (Feet): 75 Feet (X2) Assistive device: Rolling walker (2 wheeled) Gait Pattern/deviations: Step-through pattern;Decreased stride length;Drifts right/left Gait velocity: decreased   General Gait Details: mild instabiltiy but no gross loss of balance, assistance to avoid objects in path, drifting to rt side. Pt taking 1 seated rest due to reports of fatigue.    Stairs             Wheelchair Mobility    Modified Rankin (Stroke Patients Only)       Balance Overall balance assessment: Needs assistance Sitting-balance support: Feet supported Sitting balance-Leahy Scale: Good     Standing balance support: Bilateral upper extremity supported Standing balance-Leahy Scale: Poor Standing balance comment: using rw                    Cognition Arousal/Alertness: Awake/alert Behavior During Therapy: Flat affect Overall Cognitive Status: Impaired/Different from baseline Area of Impairment: Safety/judgement               General Comments: cues needed for safety concerns with mobility.     Exercises      General Comments        Pertinent Vitals/Pain Pain Assessment: No/denies pain    Home Living                      Prior Function            PT Goals (current goals can now be found in the care plan section) Acute Rehab PT Goals Patient Stated Goal: eat and drink PT Goal Formulation: With patient Time For Goal Achievement: 09/18/16 Potential to Achieve Goals: Good Progress towards PT goals: Progressing toward goals    Frequency    Min 3X/week      PT Plan Current plan remains appropriate    Co-evaluation             End of Session Equipment Utilized  During Treatment: Gait belt Activity Tolerance: Patient limited by fatigue Patient left: in chair;with call bell/phone within reach;with chair alarm set;with nursing/sitter in room (video sitter system on in room)     Time: [id number] PT Time Calculation (min) (ACUTE ONLY): 22 min  Charges:  $Gait Training: 8-22 mins                    G Codes:      Cassell Clement, PT, CSCS Pager [phone] Office [phone]  09/11/2016, 12:57 PM

## 2016-09-11 NOTE — Progress Notes (Signed)
Patient requested to get out of bed and sit in the chair.  The patient's daughter stepped out of the room and patient was assisted to the chair.  During the process, RN knocked over patient's daugther's drink accidentally in the window sill.  When cleaning up, noticed that the liquid smelled like alcohol.  Nicky Pugh, nursing director of [facility] smelled the alcohol as well.  Liquid cleaned up off floor and Assistant Director Piedra made aware.

## 2016-09-11 NOTE — Progress Notes (Signed)
Occupational Therapy Treatment Patient Details Name: Dean [name] MRN: [id number] DOB: 1951-02-14 Today's Date: 09/11/2016    History of present illness Pt is a 65 y.o. male with PMH of asthma, DM, gout, HTN who callapsed pulseless on 12/3. No CPR x 10 minutes then requiring CPR x 21 minutes, 7 rounds of epi, 5 shocks, amiodarone and magnesium, was intubated. EEG 12/4 was markedly abnormal wiht severe diffuse cerebral dysfunction. Pt was extubated 12/7,  PMH - asthma, DM, HTN, gout   OT comments  This 65 yo male seen today to work on basic ADLs. Pt making progress with grooming, toilet transfers, and LBD. He will continue to benefit from acute OT with follow up OT on CIR.  Follow Up Recommendations  CIR;Supervision/Assistance - 24 hour    Equipment Recommendations  3 in 1 bedside commode;Tub/shower seat       Precautions / Restrictions Precautions Precautions: Fall Restrictions Weight Bearing Restrictions: No       Mobility Bed Mobility Overal bed mobility: Needs Assistance Bed Mobility: Sit to Supine       Sit to supine: Min guard (HOB flat and no rail)   General bed mobility comments: PT up in recliner upon arrival  Transfers Overall transfer level: Needs assistance Equipment used: Rolling walker (2 wheeled) Transfers: Sit to/from Stand Sit to Stand: Min guard         General transfer comment: Despite cues, pt not using UEs for transfers. Performed from recliner and standard chair.     Balance Overall balance assessment: Needs assistance Sitting-balance support: Feet supported;No upper extremity supported Sitting balance-Leahy Scale: Good     Standing balance support: Single extremity supported (or support of trunk against sink while standing to wash his hands) Standing balance-Leahy Scale: Poor Standing balance comment: using rw                   ADL Overall ADL's : Needs assistance/impaired     Grooming: Wash/dry hands;Min guard;Standing                  Lower Body Dressing Details (indicate cue type and reason): doffed socks while sitting EOB in prep for getting into bed. He doffed them by using the opposite foot to pull them off, but then was unable to bend down to pick them up off the floor Toilet Transfer: Min guard;Ambulation;RW Toilet Transfer Details (indicate cue type and reason): recliner>out door and back into room>bed                            Cognition   Behavior During Therapy: Flat affect Overall Cognitive Status: Impaired/Different from baseline Area of Impairment: Safety/judgement        Following Commands: Follows one step commands consistently Safety/Judgement: Decreased awareness of safety (several times he would start to get the IV tangled up with his RW and he was unaware)     General Comments: Asked him if he had family here in [name] and he said yes his mom and dad so I asked him if they were in their 27's and he said no they were deceased. Asked him how many brother and sisters he had and it too him increased time to tell me the number, when asked where he fell in the mix of 10 children, he tried to tell me but could not come up with the answer. He did now it was Dec and 2017.  Pertinent Vitals/ Pain       Pain Assessment: No/denies pain         Frequency  Min 2X/week        Progress Toward Goals  OT Goals(current goals can now be found in the care plan section)  Progress towards OT goals: Progressing toward goals  Acute Rehab OT Goals Patient Stated Goal: eat and drink  Plan Discharge plan remains appropriate       End of Session Equipment Utilized During Treatment: Gait belt;Rolling walker   Activity Tolerance Patient tolerated treatment well   Patient Left in bed;with call bell/phone within reach;with bed alarm set (tele sitter in place as well)   Nurse Communication          Time: [id number] OT Time Calculation (min): 25  min  Charges: OT General Charges $OT Visit: 1 Procedure OT Treatments $Self Care/Home Management : 23-37 mins  Almon Register [id number] 09/11/2016, 3:24 PM

## 2016-09-11 NOTE — Progress Notes (Signed)
Speech Language Pathology Treatment:    Patient Details Name: Dean [name] MRN: [id number] DOB: 1951/02/06 Today's Date: 09/11/2016 Time: [id number] SLP Time Calculation (min) (ACUTE ONLY): 12 min  Assessment / Plan / Recommendation Clinical Impression  Pt seen for tolerance of current diet, readiness for repeat testing for potential diet upgrade. Pt is alert, but still dysphonic even with max breath support. Continues to need max cues for small sip size. Pt with coughing at baseline and delayed cough with nectar thick liquids. Recommend pt continue honey thick liquids and dys 2 solids. Will plan for MBS tomorrow if it does not interfere with cardiac procedure.    HPI        SLP Plan  Continue with current plan of care     Recommendations  Diet recommendations: Dysphagia 2 (fine chop);Honey-thick liquid Liquids provided via: Cup;No straw Medication Administration: Whole meds with puree Supervision: Full supervision/cueing for compensatory strategies;Staff to assist with self feeding Compensations: Slow rate;Small sips/bites Postural Changes and/or Swallow Maneuvers: Seated upright 90 degrees;Upright 30-60 min after meal                Oral Care Recommendations: Oral care BID Follow up Recommendations: Skilled Nursing facility;24 hour supervision/assistance Plan: Continue with current plan of care       GO                Dean [name], Dean [name] 09/11/2016, 9:26 AM

## 2016-09-11 NOTE — Clinical Social Work Note (Signed)
CSW continues to follow for possible SNF placement. At this time patient has only one bed offer.  Dayton Scrape, Dean [name]

## 2016-09-11 NOTE — Progress Notes (Signed)
SUBJECTIVE: The patient is doing well today.  At this time, he denies chest pain, shortness of breath, or any new concerns.  Dean [name] amiodarone  400 mg Oral BID  . carvedilol  12.5 mg Oral BID WC  . chlorhexidine  15 mL Mouth Rinse BID  . docusate  100 mg Oral Daily  . famotidine  20 mg Oral BID  . furosemide  20 mg Oral Daily  . heparin  5,000 Units Subcutaneous Q8H  . insulin aspart  0-9 Units Subcutaneous TID WC  . mouth rinse  15 mL Mouth Rinse q12n4p  . nitroGLYCERIN  1 inch Topical Q6H  . piperacillin-tazobactam (ZOSYN)  IV  3.375 g Intravenous Q8H  . sodium chloride flush  3 mL Intravenous Q12H     OBJECTIVE: Physical Exam: Vitals:   09/10/16 2135 09/10/16 2354 09/11/16 0200 09/11/16 0516  BP: (!) 134/45   (!) 138/37  Pulse: 62   76  Resp:    18  Temp: 99.5 F (37.5 C) 98.7 F (37.1 C) 98.4 F (36.9 C) 98.5 F (36.9 C)  TempSrc: Oral Oral Oral Oral  SpO2:    98%  Weight:    255 lb 11.2 oz (116 kg)  Height:        Intake/Output Summary (Last 24 hours) at 09/11/16 0853 Last data filed at 09/11/16 [id number]  Gross per 24 hour  Intake             1090 ml  Output                1 ml  Net             1089 ml    Telemetry is reviewed by myself, SR, no VT, occ PVCs only  GEN- The patient is in NAD, alert and oriented to self, place, appropriate.   Head- normocephalic, atraumatic Eyes-  Sclera clear, conjunctiva pink Ears- hearing intact Oropharynx- clear Neck- supple, no JVP Lungs- Clear to ausculation bilaterally, normal work of breathing Heart- Regular rate and rhythm, no significant murmurs, no rubs or gallops GI- soft, NT, ND Extremities- no clubbing, cyanosis, or edema Skin- no rash or lesion Psych- euthymic mood, full affect Neuro- no gross deficits motor appreciated  LABS: Basic Metabolic Panel:  Recent Labs  09/10/16 0444  NA 139  K 3.6  CL 105  CO2 23  GLUCOSE 97  BUN 14  CREATININE 1.23  CALCIUM 10.2   CBC:  Recent Labs  09/10/16 0444   WBC 14.3*  HGB 12.6*  HCT 40.4  MCV 84.7  PLT 182     ASSESSMENT AND PLAN:  1. Witnessed cardiac arrest, prolonged down time of at least 66minutes     S/p hypothermia protocol     Initially treted with 450 mg of amiodarone as well as 2 g of magnesium and 7 injections of epinephrine     LVEF 35-40% by echo and LVgram     on amiodarone 400mg  BID        No further VT  The patient fever the weekend developed fevers (most recent <24hrs ago 102), BC  ID panel noted, Enterobacteriaceae species detected, pending sensitivities and final cultures. Procalcitonin is elevated.  Discussed with medicine, Helmi Hechavarria hold off ICD implant for now, Kaetlyn Noa continue to defer management to primary cardiology team, Hong Moring follow from afar and plan implant once able.  2. CAD     No intervention of 100% RCA     Management with primary cardiology  3. Hypokalemia      Keep K+ >/= 4.0 (and mag >/= 2.0)      4. CHF     Cumulative negative - 5571ml     C/w primary cardiology team  5. HTN, the patient's wife reports his BP has never been able to get controlled     Severe LVH on his echo    Stable on current regime  6. Fevers     C/w internal medicine service  7. Waxing/waning confusion     C/w medicine service  [name], PA-C 09/11/2016 8:53 AM  I have seen and examined this patient with [name].  Agree with above, note added to reflect my findings.  On exam, regular rhythm, no murmurs, lungs clear.  Feeling well today though has been having fevers over the weekend.  Would continue amiodarone for now as he has had much less VT on the drug.  Plan for ICD implant but has been having fevers.  Rilyn Scroggs need negative blood cultures and 24 hours of no fevers before ICD is implanted.   Lashawn Orrego M. Ozro Russett MD 09/11/2016 9:40 AM

## 2016-09-12 ENCOUNTER — Inpatient Hospital Stay (HOSPITAL_COMMUNITY): Payer: Medicare Other

## 2016-09-12 LAB — GLUCOSE, CAPILLARY
Glucose-Capillary: 112 mg/dL — ABNORMAL HIGH (ref 65–99)
Glucose-Capillary: 144 mg/dL — ABNORMAL HIGH (ref 65–99)
Glucose-Capillary: 123 mg/dL — ABNORMAL HIGH (ref 65–99)
Glucose-Capillary: 150 mg/dL — ABNORMAL HIGH (ref 65–99)

## 2016-09-12 LAB — COMPREHENSIVE METABOLIC PANEL
ALT: 59 U/L (ref 17–63)
AST: 51 U/L — ABNORMAL HIGH (ref 15–41)
Albumin: 2.7 g/dL — ABNORMAL LOW (ref 3.5–5.0)
Alkaline Phosphatase: 91 U/L (ref 38–126)
Anion gap: 8 (ref 5–15)
BUN: 14 mg/dL (ref 6–20)
CO2: 25 mmol/L (ref 22–32)
Calcium: 9.6 mg/dL (ref 8.9–10.3)
Chloride: 104 mmol/L (ref 101–111)
Creatinine, Ser: 1.34 mg/dL — ABNORMAL HIGH (ref 0.61–1.24)
GFR calc Af Amer: >60 mL/min (ref >60)
GFR calc non Af Amer: 54 mL/min — ABNORMAL LOW (ref >60)
Glucose, Bld: 110 mg/dL — ABNORMAL HIGH (ref 65–99)
Potassium: 4 mmol/L (ref 3.5–5.1)
Sodium: 137 mmol/L (ref 135–145)
Total Bilirubin: 1.1 mg/dL (ref 0.3–1.2)
Total Protein: 6.7 g/dL (ref 6.5–8.1)

## 2016-09-12 LAB — CBC WITH DIFFERENTIAL/PLATELET
Basophils Absolute: 0 10*3/uL (ref 0.0–0.1)
Basophils Relative: 1 %
Eosinophils Absolute: 0.3 10*3/uL (ref 0.0–0.7)
Eosinophils Relative: 4 %
HCT: 34.4 % — ABNORMAL LOW (ref 39.0–52.0)
Hemoglobin: 10.8 g/dL — ABNORMAL LOW (ref 13.0–17.0)
Lymphocytes Relative: 26 %
Lymphs Abs: 2.1 10*3/uL (ref 0.7–4.0)
MCH: 26.2 pg (ref 26.0–34.0)
MCHC: 31.4 g/dL (ref 30.0–36.0)
MCV: 83.3 fL (ref 78.0–100.0)
Monocytes Absolute: 1 10*3/uL (ref 0.1–1.0)
Monocytes Relative: 12 %
Neutro Abs: 4.6 10*3/uL (ref 1.7–7.7)
Neutrophils Relative %: 57 %
Platelets: 288 10*3/uL (ref 150–400)
RBC: 4.13 MIL/uL — ABNORMAL LOW (ref 4.22–5.81)
RDW: 14 % (ref 11.5–15.5)
WBC: 8.1 10*3/uL (ref 4.0–10.5)

## 2016-09-12 LAB — PROCALCITONIN: Procalcitonin: 0.86 ng/mL

## 2016-09-12 LAB — MAGNESIUM: Magnesium: 1.9 mg/dL (ref 1.7–2.4)

## 2016-09-12 MED ORDER — ASPIRIN EC 81 MG PO TBEC
81.0000 mg | DELAYED_RELEASE_TABLET | Freq: Every day | ORAL | Status: DC
Start: 2016-09-12 — End: 2016-09-15
  Administered 2016-09-12 – 2016-09-15 (×4): 81 mg via ORAL
  Filled 2016-09-12 (×4): qty 1

## 2016-09-12 MED ORDER — GLUCERNA SHAKE PO LIQD
237.0000 mL | ORAL | Status: DC
  Administered 2016-09-12 – 2016-09-14 (×3): 237 mL via ORAL

## 2016-09-12 MED ORDER — LISINOPRIL 2.5 MG PO TABS
2.5000 mg | ORAL_TABLET | Freq: Every day | ORAL | Status: DC
  Administered 2016-09-12: 2.5 mg via ORAL
  Filled 2016-09-12: qty 1

## 2016-09-12 MED ORDER — ADULT MULTIVITAMIN W/MINERALS CH
1.0000 | ORAL_TABLET | Freq: Every day | ORAL | Status: DC
  Administered 2016-09-12 – 2016-09-15 (×4): 1 via ORAL
  Filled 2016-09-12 (×4): qty 1

## 2016-09-12 MED ORDER — ATORVASTATIN CALCIUM 40 MG PO TABS
40.0000 mg | ORAL_TABLET | Freq: Every day | ORAL | Status: DC
  Administered 2016-09-12 – 2016-09-14 (×3): 40 mg via ORAL
  Filled 2016-09-12 (×3): qty 1

## 2016-09-12 NOTE — Progress Notes (Signed)
Patient is eating graham crackers at this time, stated he wanted to finish before he went on CPAP. Will check back at later time.

## 2016-09-12 NOTE — Progress Notes (Signed)
Patient Name: Dean [name] Date of Encounter: 09/12/2016  Primary Cardiologist: Liane Comber, MD   Hospital Problem List     Principal Problem:   Cardiopulmonary arrest [facility]) Active Problems:   DM2 (diabetes mellitus, type 2) ([place])   Morbid obesity ([place])   Essential hypertension, benign   Acute encephalopathy   Acute respiratory failure ([place])   Uncomplicated asthma   Diabetes mellitus type 2 in obese ([place])   Acute on chronic combined systolic and diastolic CHF (congestive heart failure) ([facility])   Tachypnea   Hypernatremia   Hypokalemia   Leukocytosis   Acute blood loss anemia   Hypoxemia   NSVT (nonsustained ventricular tachycardia) ([facility])   Chronic respiratory failure with hypoxia ([facility])   Fever   Acute cystitis without hematuria   Bacteremia   Subjective   No chest pain or dyspnea  Inpatient Medications    . amiodarone  400 mg Oral BID  . carvedilol  12.5 mg Oral BID WC  . chlorhexidine  15 mL Mouth Rinse BID  . docusate  100 mg Oral Daily  . famotidine  20 mg Oral BID  . furosemide  20 mg Oral Daily  . heparin  5,000 Units Subcutaneous Q8H  . insulin aspart  0-9 Units Subcutaneous TID WC  . mouth rinse  15 mL Mouth Rinse q12n4p  . nitroGLYCERIN  1 inch Topical Q6H  . piperacillin-tazobactam (ZOSYN)  IV  3.375 g Intravenous Q8H  . sodium chloride flush  3 mL Intravenous Q12H    Vital Signs    Vitals:   09/11/16 0516 09/11/16 1200 09/11/16 2152 09/12/16 0556  BP: (!) 138/37 (!) 124/41 (!) 128/44 (!) 141/48  Pulse: 76 68 72 72  Resp: 18 18 18 18   Temp: 98.5 F (36.9 C) 98.9 F (37.2 C) 98 F (36.7 C) 98.6 F (37 C)  TempSrc: Oral Oral Oral Oral  SpO2: 98% 98% 96% 96%  Weight: 255 lb 11.2 oz (116 kg)   260 lb 3.2 oz (118 kg)  Height:        Intake/Output Summary (Last 24 hours) at 09/12/16 1116 Last data filed at 09/12/16 0830  Gross per 24 hour  Intake              470 ml  Output              701 ml  Net             -231 ml   Filed Weights     09/10/16 0608 09/11/16 0516 09/12/16 0556  Weight: 254 lb 12.8 oz (115.6 kg) 255 lb 11.2 oz (116 kg) 260 lb 3.2 oz (118 kg)    Physical Exam   GEN: Well nourished, obese, in no acute distress.  HEENT: Grossly normal.  Neck: Supple Cardiac: RRR Respiratory:  CTA GI: Soft, nontender, nondistended MS: no deformity or atrophy. Skin: warm and dry, no rash. Neuro:  Strength and sensation are intact. Psych: AAOx3.  Normal affect.  Labs    CBC  Recent Labs  09/10/16 0444 09/12/16 0540  WBC 14.3* 8.1  NEUTROABS  --  4.6  HGB 12.6* 10.8*  HCT 40.4 34.4*  MCV 84.7 83.3  PLT 182 [id number]   Basic Metabolic Panel  Recent Labs  09/10/16 0444 09/12/16 0540  NA 139 137  K 3.6 4.0  CL 105 104  CO2 23 25  GLUCOSE 97 110*  BUN 14 14  CREATININE 1.23 1.34*  CALCIUM 10.2 9.6  MG  --  1.9    Telemetry    Sinus   Radiology    Dg Swallowing Func-speech Pathology  Result Date: 09/12/2016 Objective Swallowing Evaluation: Type of Study: MBS-Modified Barium Swallow Study Patient Details Name: Dean [name] MRN: [id number] Date of Birth: 1951/08/06 Today's Date: 09/12/2016 Time: SLP Start Time (ACUTE ONLY): 1000-SLP Stop Time (ACUTE ONLY): 1016 SLP Time Calculation (min) (ACUTE ONLY): 16 min Past Medical History: Past Medical History: Diagnosis Date . Arthritis   "maybe in his spine" (09/05/2016) . Asthma  . Cardiac arrest ([place]) 08/27/2016  REQUIRING CPR, SHOCK & MEDICATIONS . Gout  . High cholesterol  . Hypertension  . OSA on CPAP  . Type II diabetes mellitus ([place])  Past Surgical History: Past Surgical History: Procedure Laterality Date . CARDIAC CATHETERIZATION N/A 09/07/2016  Procedure: Left Heart Cath and Coronary Angiography;  Surgeon: Peter M Martinique, MD;  Location: [facility];  Service: Cardiovascular;  Laterality: N/A; . NO PAST SURGERIES   HPI: Pt is a 65 y.o. male with PMH of asthma, DM, gout, HTN who callapsed pulseless on 12/3 requiring CPR x 21 minutes, 7 rounds of epi,  5 shocks, amiodarone and magnesium, was intubated. EEG 12/4 was markedly abnormal wiht severe diffuse cerebral dysfunction. Pt was extubated 12/7. Pt reportedly doing better on this date, family wanting him to eat. Bedside swallow eval ordered. CXR 12/7 showed increasing bibasilar atelectasis and small left effusion. Subjective: pt alert, pleasant, eager for POs Assessment / Plan / Recommendation CHL IP CLINICAL IMPRESSIONS 09/12/2016 Therapy Diagnosis Suspected primary esophageal dysphagia Clinical Impression Pt demonstrates significant improvement in swallow function, only mild, possibly baseline impairments remain. Pt is noted to have piecemeal transit of all bolus, though he always independently clears oral cavity and swallow transited residuals. Swallow initiation is intermittently delayed with all liquids, though timing improves with consecutive straw sips. No penetration or aspiration observed even when pressed to a rapid rate and with large boluses. Pt did cough consistently after most swallows, but no penetration or pharyngeal residual accounts for this. Esophageal sweep showed appearance of distal stasis which could account for coughing. Pt may upgrade to a regular diet and thin liquids, though upright posture is still essential. During this test, pt was fully upright in a chair. SLP will follow for tolerance.  Impact on safety and function Mild aspiration risk   CHL IP TREATMENT RECOMMENDATION 09/12/2016 Treatment Recommendations Therapy as outlined in treatment plan below   Prognosis 09/05/2016 Prognosis for Safe Diet Advancement Good Barriers to Reach Goals Cognitive deficits Barriers/Prognosis Comment -- CHL IP DIET RECOMMENDATION 09/12/2016 SLP Diet Recommendations Regular solids;Thin liquid Liquid Administration via Cup;Straw Medication Administration Whole meds with liquid Compensations -- Postural Changes Remain semi-upright after after feeds/meals (Comment);Seated upright at 90 degrees   CHL IP  OTHER RECOMMENDATIONS 09/12/2016 Recommended Consults -- Oral Care Recommendations Oral care BID Other Recommendations --   CHL IP FOLLOW UP RECOMMENDATIONS 09/12/2016 Follow up Recommendations Skilled Nursing facility   [facility] IP FREQUENCY AND DURATION 09/12/2016 Speech Therapy Frequency (ACUTE ONLY) min 2x/week Treatment Duration 1 week      CHL IP ORAL PHASE 09/12/2016 Oral Phase Impaired Oral - Pudding Teaspoon -- Oral - Pudding Cup -- Oral - Honey Teaspoon -- Oral - Honey Cup -- Oral - Nectar Teaspoon -- Oral - Nectar Cup Piecemeal swallowing Oral - Nectar Straw Piecemeal swallowing Oral - Thin Teaspoon -- Oral - Thin Cup Piecemeal swallowing Oral - Thin Straw Piecemeal swallowing Oral - Puree Piecemeal swallowing Oral - Mech Soft --  Oral - Regular Piecemeal swallowing Oral - Multi-Consistency -- Oral - Pill Piecemeal swallowing Oral Phase - Comment --  CHL IP PHARYNGEAL PHASE 09/12/2016 Pharyngeal Phase Impaired Pharyngeal- Pudding Teaspoon -- Pharyngeal -- Pharyngeal- Pudding Cup -- Pharyngeal -- Pharyngeal- Honey Teaspoon -- Pharyngeal -- Pharyngeal- Honey Cup -- Pharyngeal -- Pharyngeal- Nectar Teaspoon -- Pharyngeal -- Pharyngeal- Nectar Cup Delayed swallow initiation-pyriform sinuses Pharyngeal -- Pharyngeal- Nectar Straw Delayed swallow initiation-pyriform sinuses Pharyngeal Material does not enter airway Pharyngeal- Thin Teaspoon -- Pharyngeal -- Pharyngeal- Thin Cup Delayed swallow initiation-pyriform sinuses Pharyngeal Material does not enter airway Pharyngeal- Thin Straw Delayed swallow initiation-pyriform sinuses Pharyngeal -- Pharyngeal- Puree Delayed swallow initiation-vallecula Pharyngeal -- Pharyngeal- Mechanical Soft Delayed swallow initiation-vallecula Pharyngeal -- Pharyngeal- Regular Delayed swallow initiation-vallecula Pharyngeal -- Pharyngeal- Multi-consistency -- Pharyngeal -- Pharyngeal- Pill Delayed swallow initiation-pyriform sinuses Pharyngeal -- Pharyngeal Comment --  CHL IP CERVICAL  ESOPHAGEAL PHASE 09/12/2016 Cervical Esophageal Phase WFL Pudding Teaspoon -- Pudding Cup -- Honey Teaspoon -- Honey Cup -- Nectar Teaspoon -- Nectar Cup -- Nectar Straw -- Thin Teaspoon -- Thin Cup -- Thin Straw -- Puree -- Mechanical Soft -- Regular -- Multi-consistency -- Pill -- Cervical Esophageal Comment -- No flowsheet data found. [name], MA CCC-SLP [phone] Lynann Beaver 09/12/2016, 10:32 AM               2D Echocardiogram 12.4.2017  Study Conclusions   - Left ventricle: The cavity size was mildly dilated. Wall   thickness was increased in a pattern of severe LVH. Systolic   function was moderately reduced. The estimated ejection fraction   was in the range of 35% to 40%. Akinesis of the basalinferior and   apical myocardium. Features are consistent with a pseudonormal   left ventricular filling pattern, with concomitant abnormal   relaxation and increased filling pressure (grade 2 diastolic   dysfunction). - Aortic valve: There was mild regurgitation. - Mitral valve: There was mild regurgitation. - Left atrium: The atrium was severely dilated.  Patient Profile     This is a 65 y.o.malewith a history of type 2 diabetes, asthma and hypertension and presented after a witnessed cardiac arrest.  He had prolonged CPR.  He has chronic LBBB.  EF 35-40%. Cath with occluded RCA treated medically.  Assessment & Plan    1.  Status post cardiac arrest:  Admitted 12/3 following cardiac arrest.  Cath shows occluded RCA treated medically . Cont  blocker. Add ASA and statin. Add ACEI for reduced LV function. Will need ICD timing per EP.   2. Acute systolic CHF/presumed ICM:  EF 35-40%.  Continue present dose of lasix; euvolemic.  3.  Essential HTN:  Plan to add ACEI and follow; continue coreg.  4.  Lipids:  Add lipitor.  5. NSVT:  Improved; continue amiodarone; for ICD when bacteremia improves.  6. Bacteremia-per IM.  Signed, Kirk Ruths MD 09/12/2016, 11:16 AM

## 2016-09-12 NOTE — Progress Notes (Signed)
Nutrition Follow-up  DOCUMENTATION CODES:   Morbid obesity  INTERVENTION:  Provide Glucerna Shake once daily, provides 220 kcal and 10 grams of protein Multivitamin with minerals daily  NUTRITION DIAGNOSIS:   Inadequate oral intake related to inability to eat as evidenced by NPO status.  Ongoing; diet advanced but PO intake suboptimal  GOAL:   Patient will meet greater than or equal to 90% of their needs  unmet  MONITOR:   Vent status, Labs, Weight trends, Skin, I & O's  REASON FOR ASSESSMENT:   Ventilator    ASSESSMENT:   65 yo MO (310 lbs 5'5") AAM who was walking back from the bathroom 12/3 when he collapsed pulseless. CPR wa not performed by family and EMS arrived in 10 minutes and performed CPR x 21 minutes, 7 rounds of epi, 5 shocks, amiodarone and magnesium. He coded again with ROSC. King airway was changed by EDP. He was taken to CT scanner with NAD of brain, transfer to CCU and cardiology was called. Hypothermia protocol was initiated at 1545 after ct head. His prognosis for meaningful recover is poor.  Pt states that he hasn't been eating as well due to disliking the thickened liquids and textures of meats. Per nursing notes, pt has been eating 0-50% of some meals and 65-85% of some meals. Pt's weight decreased to 252 lbs on 12/12 and is now back up to 260 lbs. Pt reports eating some Magic Cup ice cream with meals. Pt was re-assessed by SLP today and approved for regular textures and thin liquids. Pt feels he will eat better now.   Labs: low hemoglobin, low albumin  Diet Order:  Diet Heart Room service appropriate? Yes; Fluid consistency: Thin  Skin:  Reviewed, no issues  Last BM:  12/18  Height:   Ht Readings from Last 1 Encounters:  09/05/16 5\' 6"  (1.676 m)    Weight:   Wt Readings from Last 1 Encounters:  09/12/16 260 lb 3.2 oz (118 kg)    Ideal Body Weight:  61.8 kg  BMI:  Body mass index is 42 kg/m.  Estimated Nutritional Needs:   Kcal:   1900-2100  Protein:  100-120 grams  Fluid:  > 1.9 L/day  EDUCATION NEEDS:   No education needs identified at this time  Scarlette Ar RD, CSP, LDN Inpatient Clinical Dietitian Pager: [phone] After Hours Pager: [phone]

## 2016-09-12 NOTE — Progress Notes (Signed)
Modified Barium Swallow Progress Note  Patient Details  Name: Dean [name] MRN: [id number] Date of Birth: 1951/03/29  Today's Date: 09/12/2016  Modified Barium Swallow completed.  Full report located under Chart Review in the Imaging Section.  Brief recommendations include the following:  Clinical Impression  Pt demonstrates significant improvement in swallow function, only mild, possibly baseline impairments remain. Pt is noted to have piecemeal transit of all bolus, though he always independently clears oral cavity and swallow transited residuals. Swallow initiation is intermittently delayed with all liquids, though timing improves with consecutive straw sips. No penetration or aspiration observed even when pressed to a rapid rate and with large boluses. Pt did cough consistently after most swallows, but no penetration or pharyngeal residual accounts for this. Esophageal sweep showed appearance of distal stasis which could account for coughing. Pt may upgrade to a regular diet and thin liquids, though upright posture is still essential. During this test, pt was fully upright in a chair. SLP will follow for tolerance.    Swallow Evaluation Recommendations       SLP Diet Recommendations: Regular solids;Thin liquid   Liquid Administration via: Cup;Straw   Medication Administration: Whole meds with liquid   Supervision: Patient able to self feed       Postural Changes: Remain semi-upright after after feeds/meals (Comment);Seated upright at 90 degrees   Oral Care Recommendations: Oral care BID       [name], MA CCC-SLP [id number]  Riyah Bardon, Katherene Ponto 09/12/2016,10:31 AM

## 2016-09-12 NOTE — Progress Notes (Signed)
PROGRESS NOTE  Dean [name]  [id number] DOB: 04/27/1951  DOA: 08/27/2016 PCP: [name], MD   Brief Narrative:  65 yo MO (310 lbs 5'5") AAM who was walking back from the bathroom 12/3 when he collapsed pulseless. CPR wa not performed by family and EMS arrived in 10 minutes and performed CPR x 21 minutes, 7 rounds of epi, 5 shocks, amiodarone and magnesium. He coded again with ROSC. King airway was changed by EDP. He was taken to CT scanner with NAD of brain, transfer to CCU and cardiology was called. Hypothermia protocol was initiated at 1545 after ct head. After clinical improvement and stabilization, care transferred from [facility] to [facility] on 09/06/16. Clinically improved >CHF compensated, hypoxia resolved, mental status steadily improving. Status post cath. EP plans ICD insertion once fever/bacteremia adequately treated.   SIGNIFICANT EVENTS: 12/3 Cardiac arrest  12/5 > Re-Warmed, off sedation  12/7 extubated  12/8 Acutely confused overnight, pulled CVC, ativan ordered. Mittens in place  12/9 - wife and daughter at bedside - feel encephalopathy is rapidly improving. sTill sleepy but much better. They think he is ready to eat. STill per RN can slip out of bed and needs siter. No on infusion. opulse ox 97% 2L Platinum. 94% on RA x 64min  Assessment & Plan:   Principal Problem:   Cardiopulmonary arrest ([place]) Active Problems:   DM2 (diabetes mellitus, type 2) ([place])   Morbid obesity ([place])   Essential hypertension, benign   Acute encephalopathy   Acute respiratory failure ([place])   Uncomplicated asthma   Diabetes mellitus type 2 in obese (HCC)   Acute on chronic combined systolic and diastolic CHF (congestive heart failure) (HCC)   Tachypnea   Hypernatremia   Hypokalemia   Leukocytosis   Acute blood loss anemia   Hypoxemia   NSVT (nonsustained ventricular tachycardia) (HCC)   Chronic respiratory failure with hypoxia (HCC)   Fever   Acute cystitis without hematuria    Bacteremia   1. Status post cardiac arrest 08/27/16 with 31 minutes down: VT/VF arrest,? Secondary to respiratory process. Cardiology following. Underwent cardiac cath 12/14 and detailed results as below. 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 Medical management recommended by cardiology. EP follow-up appreciated , ICD implant for 12/18 postponed until surveillance blood cultures negative and 24 hours without fever.  surveillance blood cultures obtained on 12/19. 2. NSVT: EP consultation appreciated. On coreg, Amiodarone started. AICD when able. Attempt to keep K>4, Mg > 2. Improved after initiation of amiodarone. 3. Acute combine Systolic & Diastolic CHF/New cardiomyopathy: -3.4 L since admission. Cardiology has switched metoprolol to carvedilol. IV Lasix transitioned to oral Lasix. Plans to add an ARB if renal function stable post cath with ultimate plan to transition to [facility]. Compensated. Cardiology to advise if patient will be switched from NTP to oral nitrates and decision regarding ARB. 4. Acute respiratory failure with hypoxia: Extubated 12/7. Hypoxia resolved. 5. Acute kidney injury: Resolved. 6. Hypokalemia: Attempt to keep potassium >4 and magnesium >2  7. Dysphagia: Speech therapy reevaluated and recommended dysphagia 2 diet. 8. DM type II: Continue SSI. Good inpatient control. 9. Essential hypertension: controlled. Currently on NTP transdermal and beta blockers changed to carvedilol.  10. Acute encephalopathy: Agitation/delirium versus anoxic injury. CT head negative. Avoid sedative and QT prolonging medications. Precedex discontinued 09/05/16. Improving. As per daughter at bedside on 12/18, mental status has improved and almost close to baseline. 11. History  of asthma: Stable. Status post extubation 08/31/16. As per family at bedside on 12/14, mental status gradually  improving. 12. Prolonged QTC: 508 ms on 09/02/16 and down to 473 ms on 09/04/16. 13. Febrile illness/gram-negative rod bacteremia: Patient had been spiking high fevers up to 102.4 since 12/15. Denies urinary symptoms, UA positive but urine culture negative. Denies any other symptoms suggestive of source. Chest x-ray negative for pneumonia. Despite IV levofloxacin for 48 hours, continued to spike fevers. Changed to IV Zosyn on 12/17. Pro-calcitonin 1.03. Discontinued levofloxacin. One of 2 blood cultures from 09/09/16 shows gram-negative rods and BCID shows Enterobacteriaceae species. Flu panel and RSV panel negative. Clinically does not look septic. Does have mild leukocytosis. Source of bacteremia not clear. Repeat surveillance blood cultures on 12/19. If continues to have ongoing fevers, consider ID consultation. 14. ? OSA: Has been on CPAP at bedtime.    DVT prophylaxis: Heparin Code Status: Full Family Communication: none at bedside. Disposition Plan: PT is recommending CIR, consult requested   Consultants:   CCM-signed off  Cardiology/EP  Procedures:  12/3 ET>>12/7 12/3 left I J CVL>>12/8 2-D echo 08/28/16: Study Conclusions  - Left ventricle: The cavity size was mildly dilated. Wall   thickness was increased in a pattern of severe LVH. Systolic   function was moderately reduced. The estimated ejection fraction   was in the range of 35% to 40%. Akinesis of the basalinferior and   apical myocardium. Features are consistent with a pseudonormal   left ventricular filling pattern, with concomitant abnormal   relaxation and increased filling pressure (grade 2 diastolic   dysfunction). - Aortic valve: There was mild regurgitation. - Mitral valve: There was mild regurgitation. - Left atrium: The atrium was severely dilated.   Left Heart Cath and Coronary Angiography  Conclusion     There is moderate left ventricular systolic dysfunction.  LV end diastolic pressure is  normal.  The left ventricular ejection fraction is 35-45% by visual estimate.  Ost RCA lesion, 90 %stenosed.  Mid RCA to Dist RCA lesion, 100 %stenosed.   1. Single vessel occlusive CAD. The distal RCA is occluded with left to right collaterals. There is a 90% ostial stenosis 2. Moderate LV dysfunction with EF 35-40%. Akinesis of the basal to mid inferior wall 3. Normal LVEDP  Plan: medical therapy. If he has significant angina we could consider PCI of the RCA but there appears to be significant scar of the inferior wall.       Antimicrobials:   IV Levofloxacin 12/15> 12/17  IV Zosyn 12/17>   Subjective: Seen this morning. Denied complaints. As per daughter at bedside, mental status is almost returned to baseline. Last fever spike 102.1F at 7:20 PM on 12/17..   Objective:  Vitals:   09/11/16 0516 09/11/16 1200 09/11/16 2152 09/12/16 0556  BP: (!) 138/37 (!) 124/41 (!) 128/44 (!) 141/48  Pulse: 76 68 72 72  Resp: 18 18 18 18   Temp: 98.5 F (36.9 C) 98.9 F (37.2 C) 98 F (36.7 C) 98.6 F (37 C)  TempSrc: Oral Oral Oral Oral  SpO2: 98% 98% 96% 96%  Weight: 116 kg (255 lb 11.2 oz)   118 kg (260 lb 3.2 oz)  Height:        Intake/Output Summary (Last 24 hours) at 09/12/16 1141 Last data filed at 09/12/16 0830  Gross per 24 hour  Intake              470 ml  Output  701 ml  Net             -231 ml   Filed Weights   09/10/16 [id number] 09/11/16 0516 09/12/16 0556  Weight: 115.6 kg (254 lb 12.8 oz) 116 kg (255 lb 11.2 oz) 118 kg (260 lb 3.2 oz)    Examination:  General exam: Pleasant middle-aged male sitting up comfortably In bed. Does not look septic or toxic. Respiratory system: clear to auscultation. Respiratory effort normal. Cardiovascular system: S1 & S2 heard, RRR. No JVD, murmurs, rubs, gallops or clicks. No pedal edema. Telemetry: Sinus rhythm.  No further episodes of NSVT in the last 72 hours.  Gastrointestinal system: Abdomen is nondistended,  soft and nontender. No organomegaly or masses felt. Normal bowel sounds heard. Central nervous system: Alert and oriented to person, place and partly to time (December 2017) . No focal neurological deficits. Extremities: Symmetric 5 x 5 power. Skin: No rashes, lesions or ulcers Psychiatry: Judgement and insight impaired . Mood & affect appropriate.     Data Reviewed: I have personally reviewed following labs and imaging studies  CBC:  Recent Labs Lab 09/06/16 0431 09/07/16 1155 09/10/16 0444 09/12/16 0540  WBC 14.3* 13.3* 14.3* 8.1  NEUTROABS  --   --   --  4.6  HGB 13.0 11.9* 12.6* 10.8*  HCT 41.5 37.8* 40.4 34.4*  MCV 84.7 84.8 84.7 83.3  PLT 227 180 182 [id number]   Basic Metabolic Panel:  Recent Labs Lab 09/06/16 0431 09/06/16 1042 09/07/16 0227 09/07/16 0844 09/07/16 1155 09/08/16 0256 09/10/16 0444 09/12/16 0540  NA 145  --  143 143  --  141 139 137  K 4.0  --  2.9* 3.4*  --  4.2 3.6 4.0  CL 112*  --  106 108  --  106 105 104  CO2 18*  --  25 25  --  22 23 25   GLUCOSE 95  --  103* 106*  --  93 97 110*  BUN 19  --  15 14  --  14 14 14   CREATININE 1.05  --  1.17 1.07 1.09 1.12 1.23 1.34*  CALCIUM 10.0  --  9.9 9.7  --  9.7 10.2 9.6  MG  --  2.0  --   --   --   --   --  1.9  PHOS 2.4*  --   --   --   --   --   --   --    GFR: Estimated Creatinine Clearance: 65.6 mL/min (by C-G formula based on SCr of 1.34 mg/dL (H)). Liver Function Tests:  Recent Labs Lab 09/08/16 0256 09/12/16 0540  AST 59* 51*  ALT 101* 59  ALKPHOS 88 91  BILITOT 1.8* 1.1  PROT 6.9 6.7  ALBUMIN 3.0* 2.7*   No results for input(s): LIPASE, AMYLASE in the last 168 hours. No results for input(s): AMMONIA in the last 168 hours. Coagulation Profile:  Recent Labs Lab 09/07/16 0710  INR 1.13   Cardiac Enzymes: No results for input(s): CKTOTAL, CKMB, CKMBINDEX, TROPONINI in the last 168 hours. BNP (last 3 results) No results for input(s): PROBNP in the last 8760 hours. HbA1C: No  results for input(s): HGBA1C in the last 72 hours. CBG:  Recent Labs Lab 09/11/16 1216 09/11/16 1605 09/11/16 2154 09/12/16 0548 09/12/16 1100  GLUCAP 152* 115* 118* 112* 144*   Lipid Profile: No results for input(s): CHOL, HDL, LDLCALC, TRIG, CHOLHDL, LDLDIRECT in the last 72 hours. Thyroid Function Tests: No results for  input(s): TSH, T4TOTAL, FREET4, T3FREE, THYROIDAB in the last 72 hours. Anemia Panel: No results for input(s): VITAMINB12, FOLATE, FERRITIN, TIBC, IRON, RETICCTPCT in the last 72 hours.  Sepsis Labs:  Recent Labs Lab 09/10/16 0708 09/12/16 0540  PROCALCITON 1.03 0.86    Recent Results (from the past 240 hour(s))  Culture, blood (routine x 2)     Status: Abnormal (Preliminary result)   Collection Time: 09/09/16  5:20 AM  Result Value Ref Range Status   Specimen Description BLOOD RIGHT HAND  Final   Special Requests BOTTLES DRAWN AEROBIC ONLY [place]  Final   Culture  Setup Time   Final    GRAM NEGATIVE RODS AEROBIC BOTTLE ONLY CRITICAL RESULT CALLED TO, READ BACK BY AND VERIFIED WITH: A MASTERS,PHARMD AT 1244 09/10/16 BY L BENFIELD    Culture CITROBACTER KOSERI SUSCEPTIBILITIES TO FOLLOW  (A)  Final   Report Status PENDING  Incomplete  Blood Culture ID Panel (Reflexed)     Status: Abnormal   Collection Time: 09/09/16  5:20 AM  Result Value Ref Range Status   Enterococcus species NOT DETECTED NOT DETECTED Final   Listeria monocytogenes NOT DETECTED NOT DETECTED Final   Staphylococcus species NOT DETECTED NOT DETECTED Final   Staphylococcus aureus NOT DETECTED NOT DETECTED Final   Streptococcus species NOT DETECTED NOT DETECTED Final   Streptococcus agalactiae NOT DETECTED NOT DETECTED Final   Streptococcus pneumoniae NOT DETECTED NOT DETECTED Final   Streptococcus pyogenes NOT DETECTED NOT DETECTED Final   Acinetobacter baumannii NOT DETECTED NOT DETECTED Final   Enterobacteriaceae species DETECTED (A) NOT DETECTED Final    Comment: CRITICAL RESULT  CALLED TO, READ BACK BY AND VERIFIED WITH: A MASTERS,PHARMD AT 1244 09/10/16 BY L BENFIELD    Enterobacter cloacae complex NOT DETECTED NOT DETECTED Final   Escherichia coli NOT DETECTED NOT DETECTED Final   Klebsiella oxytoca NOT DETECTED NOT DETECTED Final   Klebsiella pneumoniae NOT DETECTED NOT DETECTED Final   Proteus species NOT DETECTED NOT DETECTED Final   Serratia marcescens NOT DETECTED NOT DETECTED Final   Carbapenem resistance NOT DETECTED NOT DETECTED Final   Haemophilus influenzae NOT DETECTED NOT DETECTED Final   Neisseria meningitidis NOT DETECTED NOT DETECTED Final   Pseudomonas aeruginosa NOT DETECTED NOT DETECTED Final   Candida albicans NOT DETECTED NOT DETECTED Final   Candida glabrata NOT DETECTED NOT DETECTED Final   Candida krusei NOT DETECTED NOT DETECTED Final   Candida parapsilosis NOT DETECTED NOT DETECTED Final   Candida tropicalis NOT DETECTED NOT DETECTED Final  Culture, blood (routine x 2)     Status: None (Preliminary result)   Collection Time: 09/09/16  5:50 AM  Result Value Ref Range Status   Specimen Description BLOOD RIGHT HAND  Final   Special Requests IN PEDIATRIC BOTTLE 2CC  Final   Culture NO GROWTH 2 DAYS  Final   Report Status PENDING  Incomplete  Culture, Urine     Status: None   Collection Time: 09/09/16  8:11 AM  Result Value Ref Range Status   Specimen Description URINE, RANDOM  Final   Special Requests NONE  Final   Culture NO GROWTH  Final   Report Status 09/10/2016 FINAL  Final  Respiratory Panel by PCR     Status: None   Collection Time: 09/10/16 10:46 AM  Result Value Ref Range Status   Adenovirus NOT DETECTED NOT DETECTED Final   Coronavirus 229E NOT DETECTED NOT DETECTED Final   Coronavirus HKU1 NOT DETECTED NOT DETECTED Final  Coronavirus NL63 NOT DETECTED NOT DETECTED Final   Coronavirus OC43 NOT DETECTED NOT DETECTED Final   Metapneumovirus NOT DETECTED NOT DETECTED Final   Rhinovirus / Enterovirus NOT DETECTED NOT  DETECTED Final   Influenza A NOT DETECTED NOT DETECTED Final   Influenza B NOT DETECTED NOT DETECTED Final   Parainfluenza Virus 1 NOT DETECTED NOT DETECTED Final   Parainfluenza Virus 2 NOT DETECTED NOT DETECTED Final   Parainfluenza Virus 3 NOT DETECTED NOT DETECTED Final   Parainfluenza Virus 4 NOT DETECTED NOT DETECTED Final   Respiratory Syncytial Virus NOT DETECTED NOT DETECTED Final   Bordetella pertussis NOT DETECTED NOT DETECTED Final   Chlamydophila pneumoniae NOT DETECTED NOT DETECTED Final   Mycoplasma pneumoniae NOT DETECTED NOT DETECTED Final         Radiology Studies: Dg Swallowing Func-speech Pathology  Result Date: 09/12/2016 Objective Swallowing Evaluation: Type of Study: MBS-Modified Barium Swallow Study Patient Details Name: Dock Shamburg MRN: [id number] Date of Birth: 1951-04-19 Today's Date: 09/12/2016 Time: SLP Start Time (ACUTE ONLY): 1000-SLP Stop Time (ACUTE ONLY): 1016 SLP Time Calculation (min) (ACUTE ONLY): 16 min Past Medical History: Past Medical History: Diagnosis Date . Arthritis   "maybe in his spine" (09/05/2016) . Asthma  . Cardiac arrest ([place]) 08/27/2016  REQUIRING CPR, SHOCK & MEDICATIONS . Gout  . High cholesterol  . Hypertension  . OSA on CPAP  . Type II diabetes mellitus ([place])  Past Surgical History: Past Surgical History: Procedure Laterality Date . CARDIAC CATHETERIZATION N/A 09/07/2016  Procedure: Left Heart Cath and Coronary Angiography;  Surgeon: Peter M Martinique, MD;  Location: [facility];  Service: Cardiovascular;  Laterality: N/A; . NO PAST SURGERIES   HPI: Pt is a 65 y.o. male with PMH of asthma, DM, gout, HTN who callapsed pulseless on 12/3 requiring CPR x 21 minutes, 7 rounds of epi, 5 shocks, amiodarone and magnesium, was intubated. EEG 12/4 was markedly abnormal wiht severe diffuse cerebral dysfunction. Pt was extubated 12/7. Pt reportedly doing better on this date, family wanting him to eat. Bedside swallow eval ordered. CXR 12/7  showed increasing bibasilar atelectasis and small left effusion. Subjective: pt alert, pleasant, eager for POs Assessment / Plan / Recommendation CHL IP CLINICAL IMPRESSIONS 09/12/2016 Therapy Diagnosis Suspected primary esophageal dysphagia Clinical Impression Pt demonstrates significant improvement in swallow function, only mild, possibly baseline impairments remain. Pt is noted to have piecemeal transit of all bolus, though he always independently clears oral cavity and swallow transited residuals. Swallow initiation is intermittently delayed with all liquids, though timing improves with consecutive straw sips. No penetration or aspiration observed even when pressed to a rapid rate and with large boluses. Pt did cough consistently after most swallows, but no penetration or pharyngeal residual accounts for this. Esophageal sweep showed appearance of distal stasis which could account for coughing. Pt may upgrade to a regular diet and thin liquids, though upright posture is still essential. During this test, pt was fully upright in a chair. SLP will follow for tolerance.  Impact on safety and function Mild aspiration risk   CHL IP TREATMENT RECOMMENDATION 09/12/2016 Treatment Recommendations Therapy as outlined in treatment plan below   Prognosis 09/05/2016 Prognosis for Safe Diet Advancement Good Barriers to Reach Goals Cognitive deficits Barriers/Prognosis Comment -- CHL IP DIET RECOMMENDATION 09/12/2016 SLP Diet Recommendations Regular solids;Thin liquid Liquid Administration via Cup;Straw Medication Administration Whole meds with liquid Compensations -- Postural Changes Remain semi-upright after after feeds/meals (Comment);Seated upright at 90 degrees   CHL IP OTHER  RECOMMENDATIONS 09/12/2016 Recommended Consults -- Oral Care Recommendations Oral care BID Other Recommendations --   CHL IP FOLLOW UP RECOMMENDATIONS 09/12/2016 Follow up Recommendations Skilled Nursing facility   [facility] IP FREQUENCY AND DURATION  09/12/2016 Speech Therapy Frequency (ACUTE ONLY) min 2x/week Treatment Duration 1 week      CHL IP ORAL PHASE 09/12/2016 Oral Phase Impaired Oral - Pudding Teaspoon -- Oral - Pudding Cup -- Oral - Honey Teaspoon -- Oral - Honey Cup -- Oral - Nectar Teaspoon -- Oral - Nectar Cup Piecemeal swallowing Oral - Nectar Straw Piecemeal swallowing Oral - Thin Teaspoon -- Oral - Thin Cup Piecemeal swallowing Oral - Thin Straw Piecemeal swallowing Oral - Puree Piecemeal swallowing Oral - Mech Soft -- Oral - Regular Piecemeal swallowing Oral - Multi-Consistency -- Oral - Pill Piecemeal swallowing Oral Phase - Comment --  CHL IP PHARYNGEAL PHASE 09/12/2016 Pharyngeal Phase Impaired Pharyngeal- Pudding Teaspoon -- Pharyngeal -- Pharyngeal- Pudding Cup -- Pharyngeal -- Pharyngeal- Honey Teaspoon -- Pharyngeal -- Pharyngeal- Honey Cup -- Pharyngeal -- Pharyngeal- Nectar Teaspoon -- Pharyngeal -- Pharyngeal- Nectar Cup Delayed swallow initiation-pyriform sinuses Pharyngeal -- Pharyngeal- Nectar Straw Delayed swallow initiation-pyriform sinuses Pharyngeal Material does not enter airway Pharyngeal- Thin Teaspoon -- Pharyngeal -- Pharyngeal- Thin Cup Delayed swallow initiation-pyriform sinuses Pharyngeal Material does not enter airway Pharyngeal- Thin Straw Delayed swallow initiation-pyriform sinuses Pharyngeal -- Pharyngeal- Puree Delayed swallow initiation-vallecula Pharyngeal -- Pharyngeal- Mechanical Soft Delayed swallow initiation-vallecula Pharyngeal -- Pharyngeal- Regular Delayed swallow initiation-vallecula Pharyngeal -- Pharyngeal- Multi-consistency -- Pharyngeal -- Pharyngeal- Pill Delayed swallow initiation-pyriform sinuses Pharyngeal -- Pharyngeal Comment --  CHL IP CERVICAL ESOPHAGEAL PHASE 09/12/2016 Cervical Esophageal Phase WFL Pudding Teaspoon -- Pudding Cup -- Honey Teaspoon -- Honey Cup -- Nectar Teaspoon -- Nectar Cup -- Nectar Straw -- Thin Teaspoon -- Thin Cup -- Thin Straw -- Puree -- Mechanical Soft -- Regular  -- Multi-consistency -- Pill -- Cervical Esophageal Comment -- No flowsheet data found. [name], [place] CCC-SLP [phone] Lynann Beaver 09/12/2016, 10:32 AM                   Scheduled Meds: . amiodarone  400 mg Oral BID  . aspirin EC  81 mg Oral Daily  . atorvastatin  40 mg Oral q1800  . carvedilol  12.5 mg Oral BID WC  . chlorhexidine  15 mL Mouth Rinse BID  . docusate  100 mg Oral Daily  . famotidine  20 mg Oral BID  . furosemide  20 mg Oral Daily  . heparin  5,000 Units Subcutaneous Q8H  . insulin aspart  0-9 Units Subcutaneous TID WC  . lisinopril  2.5 mg Oral Daily  . mouth rinse  15 mL Mouth Rinse q12n4p  . piperacillin-tazobactam (ZOSYN)  IV  3.375 g Intravenous Q8H  . sodium chloride flush  3 mL Intravenous Q12H   Continuous Infusions:    LOS: 16 days       Jentzen Minasyan, MD [facility] Pager [phone]  If 7PM-7AM, please contact night-coverage [email or web address] Password [email or web address] 09/12/2016, 11:41 AM

## 2016-09-12 NOTE — Care Management Important Message (Signed)
Important Message  Patient Details  Name: Dean [name] MRN: [id number] Date of Birth: [birth year]   Medicare Important Message Given:  Yes    Jeanee Fabre 09/12/2016, 11:22 AM

## 2016-09-13 ENCOUNTER — Encounter (HOSPITAL_COMMUNITY): Payer: Self-pay | Admitting: Radiology

## 2016-09-13 ENCOUNTER — Inpatient Hospital Stay (HOSPITAL_COMMUNITY): Payer: Medicare Other

## 2016-09-13 LAB — BASIC METABOLIC PANEL
Anion gap: 9 (ref 5–15)
BUN: 13 mg/dL (ref 6–20)
CO2: 22 mmol/L (ref 22–32)
Calcium: 9.3 mg/dL (ref 8.9–10.3)
Chloride: 104 mmol/L (ref 101–111)
Creatinine, Ser: 1.12 mg/dL (ref 0.61–1.24)
GFR calc Af Amer: >60 mL/min (ref >60)
GFR calc non Af Amer: >60 mL/min (ref >60)
Glucose, Bld: 118 mg/dL — ABNORMAL HIGH (ref 65–99)
Potassium: 3.6 mmol/L (ref 3.5–5.1)
Sodium: 135 mmol/L (ref 135–145)

## 2016-09-13 LAB — CULTURE, BLOOD (ROUTINE X 2)

## 2016-09-13 LAB — CBC
HCT: 33 % — ABNORMAL LOW (ref 39.0–52.0)
Hemoglobin: 10.2 g/dL — ABNORMAL LOW (ref 13.0–17.0)
MCH: 26.3 pg (ref 26.0–34.0)
MCHC: 30.9 g/dL (ref 30.0–36.0)
MCV: 85.1 fL (ref 78.0–100.0)
Platelets: 260 10*3/uL (ref 150–400)
RBC: 3.88 MIL/uL — ABNORMAL LOW (ref 4.22–5.81)
RDW: 14.3 % (ref 11.5–15.5)
WBC: 9.4 10*3/uL (ref 4.0–10.5)

## 2016-09-13 LAB — GLUCOSE, CAPILLARY
Glucose-Capillary: 119 mg/dL — ABNORMAL HIGH (ref 65–99)
Glucose-Capillary: 147 mg/dL — ABNORMAL HIGH (ref 65–99)
Glucose-Capillary: 101 mg/dL — ABNORMAL HIGH (ref 65–99)
Glucose-Capillary: 101 mg/dL — ABNORMAL HIGH (ref 65–99)

## 2016-09-13 MED ORDER — IOPAMIDOL (ISOVUE-300) INJECTION 61%
INTRAVENOUS | Status: AC
  Administered 2016-09-13: 100 mL
  Filled 2016-09-13: qty 100

## 2016-09-13 MED ORDER — LISINOPRIL 5 MG PO TABS
5.0000 mg | ORAL_TABLET | Freq: Every day | ORAL | Status: DC
  Administered 2016-09-13 – 2016-09-15 (×3): 5 mg via ORAL
  Filled 2016-09-13 (×3): qty 1

## 2016-09-13 MED ORDER — IOPAMIDOL (ISOVUE-300) INJECTION 61%
INTRAVENOUS | Status: AC
  Administered 2016-09-13: 11:00:00
  Filled 2016-09-13: qty 30

## 2016-09-13 MED ORDER — POTASSIUM CHLORIDE CRYS ER 20 MEQ PO TBCR
40.0000 meq | EXTENDED_RELEASE_TABLET | Freq: Once | ORAL | Status: AC
  Administered 2016-09-13: 40 meq via ORAL
  Filled 2016-09-13: qty 2

## 2016-09-13 NOTE — Progress Notes (Signed)
Rehab admissions - I spoke with unit case manager and patient may be ready for inpatient rehab tomorrow.  Noted patient will need life vest and ICD on hold for now.  I will contact insurance case manager and request acute inpatient rehab admission.  I will follow up in am.  Call me for questions.  [phone]

## 2016-09-13 NOTE — Progress Notes (Signed)
SLP Cancellation Note  Patient Details Name: Antionne Roane MRN: [id number] DOB: Nov 27, 1950   Cancelled treatment:       Reason Eval/Treat Not Completed: Patient at procedure or test/unavailable. Pt to have CT abdomen later today. NPO though order does not say it.    Sheera Illingworth, Katherene Ponto 09/13/2016, 2:03 PM

## 2016-09-13 NOTE — Progress Notes (Addendum)
PROGRESS NOTE  Dean [name]  [id number] DOB: September 23, 1951  DOA: 08/27/2016 PCP: [name], MD   Brief Narrative:  65 yo MO (310 lbs 5'5") AAM who was walking back from the bathroom 12/3 when he collapsed pulseless. CPR wa not performed by family and EMS arrived in 10 minutes and performed CPR x 21 minutes, 7 rounds of epi, 5 shocks, amiodarone and magnesium. He coded again with ROSC. King airway was changed by EDP. He was taken to CT scanner with NAD of brain, transfer to CCU and cardiology was called. Hypothermia protocol was initiated at 1545 after ct head. After clinical improvement and stabilization, care transferred from [facility] to [facility] on 09/06/16. Clinically improved >CHF compensated, hypoxia resolved, mental status steadily improving. Status post cath. EP plans ICD insertion once fever/bacteremia adequately treated.   SIGNIFICANT EVENTS: 12/3 Cardiac arrest  12/5 > Re-Warmed, off sedation  12/7 extubated  12/8 Acutely confused overnight, pulled CVC, ativan ordered. Mittens in place  12/9 - wife and daughter at bedside - feel encephalopathy is rapidly improving. sTill sleepy but much better. They think he is ready to eat. STill per RN can slip out of bed and needs siter. No on infusion. opulse ox 97% 2L [place]. 94% on RA x 27min  Assessment & Plan:   Principal Problem:   Cardiopulmonary arrest ([place]) Active Problems:   DM2 (diabetes mellitus, type 2) ([place])   Morbid obesity ([place])   Essential hypertension, benign   Acute encephalopathy   Acute respiratory failure ([place])   Uncomplicated asthma   Diabetes mellitus type 2 in obese ([place])   Acute on chronic combined systolic and diastolic CHF (congestive heart failure) (HCC)   Tachypnea   Hypernatremia   Hypokalemia   Leukocytosis   Acute blood loss anemia   Hypoxemia   NSVT (nonsustained ventricular tachycardia) ([facility])   Chronic respiratory failure with hypoxia (H[facility])   Fever   Acute cystitis without hematuria   Bacteremia  Status  post cardiac arrest 08/27/16 with 31 minutes down:  - VT/VF arrest,? Secondary to respiratory process. Cardiology following. Underwent cardiac cath 12/14 and detailed results as below. 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 Medical management recommended by cardiology.  -EP follow-up appreciated , ICD implant for 12/18 postponed due to bacteremia, so far surveillance blood culture 10/19 with no growth to date.   NSVT: - EP consultation appreciated. On coreg, Amiodarone . AICD when able. -  Attempt to keep K>4, Mg > 2.  Acute combine Systolic & Diastolic CHF/New cardiomyopathy: - Cardiology consult appreciated, continue with Coreg, lisinopril, euvolemic, diuresis per cardiology, currently on oral Lasix  - EF 35-40%  Citrobacter bacteremia - Unclear source, positive urinalysis but urine cultures are negative, discussed with ID Dr. Drucilla Schmidt, abdominal sources need to be ruled out, so will obtain CT abdomen and pelvis to rule out abdominal etiology of bacteremia, and if CT abdomen pelvis is negative okay to proceed with AICD insertion given surveillance cultures remain negative. - Continue with IV Zosyn  Acute respiratory failure with hypoxia:  - Extubated 12/7. Hypoxia resolved.  Acute kidney injury:  - Resolved.  Hypokalemia:  - Attempt to keep potassium >4 and magnesium >2   Dysphagia:  - Followed by his P, currently on regular solids with thin liquid  DM type II:  - Continue SSI. Good inpatient control.  Essential hypertension: -  controlled. Continue with Coreg and lisinopril  Acute  encephalopathy: -  Agitation/delirium versus anoxic injury. CT head negative. Avoid sedative and QT prolonging medications. Precedex discontinued 09/05/16. Improving. As per daughter at bedside on 12/18, mental status has improved and almost close to baseline.  History of asthma:  - Stable.  Status post extubation 08/31/16. As per family at bedside on 12/14, mental status gradually improving.  Prolonged QTC:  - 508 ms on 09/02/16 and down to 473 ms on 09/04/16.  ? OSA:  - Has been on CPAP at bedtime.    DVT prophylaxis: Heparin Code Status: Full Family Communication: none at bedside. Disposition Plan: PT is recommending CIR, consult requested   Consultants:   CCM-signed off  Cardiology/EP  Procedures:  12/3 ET>>12/7 12/3 left I J CVL>>12/8 2-D echo 08/28/16: Study Conclusions  - Left ventricle: The cavity size was mildly dilated. Wall   thickness was increased in a pattern of severe LVH. Systolic   function was moderately reduced. The estimated ejection fraction   was in the range of 35% to 40%. Akinesis of the basalinferior and   apical myocardium. Features are consistent with a pseudonormal   left ventricular filling pattern, with concomitant abnormal   relaxation and increased filling pressure (grade 2 diastolic   dysfunction). - Aortic valve: There was mild regurgitation. - Mitral valve: There was mild regurgitation. - Left atrium: The atrium was severely dilated.   Left Heart Cath and Coronary Angiography  Conclusion     There is moderate left ventricular systolic dysfunction.  LV end diastolic pressure is normal.  The left ventricular ejection fraction is 35-45% by visual estimate.  Ost RCA lesion, 90 %stenosed.  Mid RCA to Dist RCA lesion, 100 %stenosed.   1. Single vessel occlusive CAD. The distal RCA is occluded with left to right collaterals. There is a 90% ostial stenosis 2. Moderate LV dysfunction with EF 35-40%. Akinesis of the basal to mid inferior wall 3. Normal LVEDP  Plan: medical therapy. If he has significant angina we could consider PCI of the RCA but there appears to be significant scar of the inferior wall.       Antimicrobials:   IV Levofloxacin 12/15> 12/17  IV Zosyn 12/17>   Subjective: Seen this morning.  Denied complaints.  mental status is almost returned to baseline. Afebrile . Objective:  Vitals:   09/12/16 1200 09/12/16 1925 09/13/16 0001 09/13/16 0512  BP: 127/66 (!) 122/53  (!) 148/45  Pulse: 76 (!) 58  (!) 57  Resp: 20 18  18   Temp: 98.4 F (36.9 C) 98.6 F (37 C) 98.4 F (36.9 C) 97.9 F (36.6 C)  TempSrc: Oral Oral Oral Oral  SpO2: 92% 98%  98%  Weight:    115 kg (253 lb 9.6 oz)  Height:        Intake/Output Summary (Last 24 hours) at 09/13/16 1026 Last data filed at 09/13/16 0830  Gross per 24 hour  Intake              243 ml  Output              350 ml  Net             -107 ml   Filed Weights   09/11/16 0516 09/12/16 0556 09/13/16 0512  Weight: 116 kg (255 lb 11.2 oz) 118 kg (260 lb 3.2 oz) 115 kg (253 lb 9.6 oz)    Examination:  General exam: Pleasant middle-aged male sitting up comfortably In bed. Does not look septic or toxic. Respiratory  system: clear to auscultation. Respiratory effort normal. Cardiovascular system: S1 & S2 heard, RRR. No JVD, murmurs, rubs, gallops or clicks. No pedal edema. Telemetry: Sinus rhythm.  No further episodes of NSVT in the last 72 hours.  Gastrointestinal system: Abdomen is nondistended, soft and nontender. No organomegaly or masses felt. Normal bowel sounds heard. Central nervous system: Alert and oriented to person, place and partly to time (December 2017) . No focal neurological deficits. Extremities: Symmetric 5 x 5 power. Skin: No rashes, lesions or ulcers Psychiatry: Judgement and insight impaired . Mood & affect appropriate.     Data Reviewed: I have personally reviewed following labs and imaging studies  CBC:  Recent Labs Lab 09/07/16 1155 09/10/16 0444 09/12/16 0540 09/13/16 0402  WBC 13.3* 14.3* 8.1 9.4  NEUTROABS  --   --  4.6  --   HGB 11.9* 12.6* 10.8* 10.2*  HCT 37.8* 40.4 34.4* 33.0*  MCV 84.8 84.7 83.3 85.1  PLT 180 182 288 [id number]   Basic Metabolic Panel:  Recent Labs Lab 09/06/16 1042   09/07/16 0844 09/07/16 1155 09/08/16 0256 09/10/16 0444 09/12/16 0540 09/13/16 0402  NA  --   < > 143  --  141 139 137 135  K  --   < > 3.4*  --  4.2 3.6 4.0 3.6  CL  --   < > 108  --  106 105 104 104  CO2  --   < > 25  --  22 23 25 22   GLUCOSE  --   < > 106*  --  93 97 110* 118*  BUN  --   < > 14  --  14 14 14 13   CREATININE  --   < > 1.07 1.09 1.12 1.23 1.34* 1.12  CALCIUM  --   < > 9.7  --  9.7 10.2 9.6 9.3  MG 2.0  --   --   --   --   --  1.9  --   < > = values in this interval not displayed. GFR: Estimated Creatinine Clearance: 77.4 mL/min (by C-G formula based on SCr of 1.12 mg/dL). Liver Function Tests:  Recent Labs Lab 09/08/16 0256 09/12/16 0540  AST 59* 51*  ALT 101* 59  ALKPHOS 88 91  BILITOT 1.8* 1.1  PROT 6.9 6.7  ALBUMIN 3.0* 2.7*   No results for input(s): LIPASE, AMYLASE in the last 168 hours. No results for input(s): AMMONIA in the last 168 hours. Coagulation Profile:  Recent Labs Lab 09/07/16 0710  INR 1.13   Cardiac Enzymes: No results for input(s): CKTOTAL, CKMB, CKMBINDEX, TROPONINI in the last 168 hours. BNP (last 3 results) No results for input(s): PROBNP in the last 8760 hours. HbA1C: No results for input(s): HGBA1C in the last 72 hours. CBG:  Recent Labs Lab 09/12/16 0548 09/12/16 1100 09/12/16 1658 09/12/16 2105 09/13/16 0642  GLUCAP 112* 144* 123* 150* 119*   Lipid Profile: No results for input(s): CHOL, HDL, LDLCALC, TRIG, CHOLHDL, LDLDIRECT in the last 72 hours. Thyroid Function Tests: No results for input(s): TSH, T4TOTAL, FREET4, T3FREE, THYROIDAB in the last 72 hours. Anemia Panel: No results for input(s): VITAMINB12, FOLATE, FERRITIN, TIBC, IRON, RETICCTPCT in the last 72 hours.  Sepsis Labs:  Recent Labs Lab 09/10/16 0708 09/12/16 0540  PROCALCITON 1.03 0.86    Recent Results (from the past 240 hour(s))  Culture, blood (routine x 2)     Status: Abnormal   Collection Time: 09/09/16  5:20 AM  Result  Value Ref  Range Status   Specimen Description BLOOD RIGHT HAND  Final   Special Requests BOTTLES DRAWN AEROBIC ONLY 6CC  Final   Culture  Setup Time   Final    GRAM NEGATIVE RODS AEROBIC BOTTLE ONLY CRITICAL RESULT CALLED TO, READ BACK BY AND VERIFIED WITH: A MASTERS,PHARMD AT 1244 09/10/16 BY L BENFIELD    Culture CITROBACTER KOSERI (A)  Final   Report Status 09/13/2016 FINAL  Final   Organism ID, Bacteria CITROBACTER KOSERI  Final      Susceptibility   Citrobacter koseri - MIC*    CEFAZOLIN <=4 SENSITIVE Sensitive     CEFEPIME <=1 SENSITIVE Sensitive     CEFTAZIDIME <=1 SENSITIVE Sensitive     CEFTRIAXONE <=1 SENSITIVE Sensitive     CIPROFLOXACIN <=0.25 SENSITIVE Sensitive     GENTAMICIN <=1 SENSITIVE Sensitive     IMIPENEM <=0.25 SENSITIVE Sensitive     TRIMETH/SULFA <=20 SENSITIVE Sensitive     PIP/TAZO <=4 SENSITIVE Sensitive     * CITROBACTER KOSERI  Blood Culture ID Panel (Reflexed)     Status: Abnormal   Collection Time: 09/09/16  5:20 AM  Result Value Ref Range Status   Enterococcus species NOT DETECTED NOT DETECTED Final   Listeria monocytogenes NOT DETECTED NOT DETECTED Final   Staphylococcus species NOT DETECTED NOT DETECTED Final   Staphylococcus aureus NOT DETECTED NOT DETECTED Final   Streptococcus species NOT DETECTED NOT DETECTED Final   Streptococcus agalactiae NOT DETECTED NOT DETECTED Final   Streptococcus pneumoniae NOT DETECTED NOT DETECTED Final   Streptococcus pyogenes NOT DETECTED NOT DETECTED Final   Acinetobacter baumannii NOT DETECTED NOT DETECTED Final   Enterobacteriaceae species DETECTED (A) NOT DETECTED Final    Comment: CRITICAL RESULT CALLED TO, READ BACK BY AND VERIFIED WITH: A MASTERS,PHARMD AT 1244 09/10/16 BY L BENFIELD    Enterobacter cloacae complex NOT DETECTED NOT DETECTED Final   Escherichia coli NOT DETECTED NOT DETECTED Final   Klebsiella oxytoca NOT DETECTED NOT DETECTED Final   Klebsiella pneumoniae NOT DETECTED NOT DETECTED Final    Proteus species NOT DETECTED NOT DETECTED Final   Serratia marcescens NOT DETECTED NOT DETECTED Final   Carbapenem resistance NOT DETECTED NOT DETECTED Final   Haemophilus influenzae NOT DETECTED NOT DETECTED Final   Neisseria meningitidis NOT DETECTED NOT DETECTED Final   Pseudomonas aeruginosa NOT DETECTED NOT DETECTED Final   Candida albicans NOT DETECTED NOT DETECTED Final   Candida glabrata NOT DETECTED NOT DETECTED Final   Candida krusei NOT DETECTED NOT DETECTED Final   Candida parapsilosis NOT DETECTED NOT DETECTED Final   Candida tropicalis NOT DETECTED NOT DETECTED Final  Culture, blood (routine x 2)     Status: None (Preliminary result)   Collection Time: 09/09/16  5:50 AM  Result Value Ref Range Status   Specimen Description BLOOD RIGHT HAND  Final   Special Requests IN PEDIATRIC BOTTLE 2CC  Final   Culture NO GROWTH 3 DAYS  Final   Report Status PENDING  Incomplete  Culture, Urine     Status: None   Collection Time: 09/09/16  8:11 AM  Result Value Ref Range Status   Specimen Description URINE, RANDOM  Final   Special Requests NONE  Final   Culture NO GROWTH  Final   Report Status 09/10/2016 FINAL  Final  Respiratory Panel by PCR     Status: None   Collection Time: 09/10/16 10:46 AM  Result Value Ref Range Status   Adenovirus NOT DETECTED NOT DETECTED  Final   Coronavirus 229E NOT DETECTED NOT DETECTED Final   Coronavirus HKU1 NOT DETECTED NOT DETECTED Final   Coronavirus NL63 NOT DETECTED NOT DETECTED Final   Coronavirus OC43 NOT DETECTED NOT DETECTED Final   Metapneumovirus NOT DETECTED NOT DETECTED Final   Rhinovirus / Enterovirus NOT DETECTED NOT DETECTED Final   Influenza A NOT DETECTED NOT DETECTED Final   Influenza B NOT DETECTED NOT DETECTED Final   Parainfluenza Virus 1 NOT DETECTED NOT DETECTED Final   Parainfluenza Virus 2 NOT DETECTED NOT DETECTED Final   Parainfluenza Virus 3 NOT DETECTED NOT DETECTED Final   Parainfluenza Virus 4 NOT DETECTED NOT  DETECTED Final   Respiratory Syncytial Virus NOT DETECTED NOT DETECTED Final   Bordetella pertussis NOT DETECTED NOT DETECTED Final   Chlamydophila pneumoniae NOT DETECTED NOT DETECTED Final   Mycoplasma pneumoniae NOT DETECTED NOT DETECTED Final         Radiology Studies: Dg Swallowing Func-speech Pathology  Result Date: 09/12/2016 Objective Swallowing Evaluation: Type of Study: MBS-Modified Barium Swallow Study Patient Details Name: Moir Baney MRN: [id number] Date of Birth: [birth year] Today's Date: 09/12/2016 Time: SLP Start Time (ACUTE ONLY): 1000-SLP Stop Time (ACUTE ONLY): 1016 SLP Time Calculation (min) (ACUTE ONLY): 16 min Past Medical History: Past Medical History: Diagnosis Date . Arthritis   "maybe in his spine" (09/05/2016) . Asthma  . Cardiac arrest ([place]) 08/27/2016  REQUIRING CPR, SHOCK & MEDICATIONS . Gout  . High cholesterol  . Hypertension  . OSA on CPAP  . Type II diabetes mellitus ([place])  Past Surgical History: Past Surgical History: Procedure Laterality Date . CARDIAC CATHETERIZATION N/A 09/07/2016  Procedure: Left Heart Cath and Coronary Angiography;  Surgeon: Peter M Martinique, MD;  Location: [facility];  Service: Cardiovascular;  Laterality: N/A; . NO PAST SURGERIES   HPI: Pt is a 65 y.o. male with PMH of asthma, DM, gout, HTN who callapsed pulseless on 12/3 requiring CPR x 21 minutes, 7 rounds of epi, 5 shocks, amiodarone and magnesium, was intubated. EEG 12/4 was markedly abnormal wiht severe diffuse cerebral dysfunction. Pt was extubated 12/7. Pt reportedly doing better on this date, family wanting him to eat. Bedside swallow eval ordered. CXR 12/7 showed increasing bibasilar atelectasis and small left effusion. Subjective: pt alert, pleasant, eager for POs Assessment / Plan / Recommendation CHL IP CLINICAL IMPRESSIONS 09/12/2016 Therapy Diagnosis Suspected primary esophageal dysphagia Clinical Impression Pt demonstrates significant improvement in swallow function, only  mild, possibly baseline impairments remain. Pt is noted to have piecemeal transit of all bolus, though he always independently clears oral cavity and swallow transited residuals. Swallow initiation is intermittently delayed with all liquids, though timing improves with consecutive straw sips. No penetration or aspiration observed even when pressed to a rapid rate and with large boluses. Pt did cough consistently after most swallows, but no penetration or pharyngeal residual accounts for this. Esophageal sweep showed appearance of distal stasis which could account for coughing. Pt may upgrade to a regular diet and thin liquids, though upright posture is still essential. During this test, pt was fully upright in a chair. SLP will follow for tolerance.  Impact on safety and function Mild aspiration risk   CHL IP TREATMENT RECOMMENDATION 09/12/2016 Treatment Recommendations Therapy as outlined in treatment plan below   Prognosis 09/05/2016 Prognosis for Safe Diet Advancement Good Barriers to Reach Goals Cognitive deficits Barriers/Prognosis Comment -- CHL IP DIET RECOMMENDATION 09/12/2016 SLP Diet Recommendations Regular solids;Thin liquid Liquid Administration via Cup;Straw Medication Administration Whole meds  with liquid Compensations -- Postural Changes Remain semi-upright after after feeds/meals (Comment);Seated upright at 90 degrees   CHL IP OTHER RECOMMENDATIONS 09/12/2016 Recommended Consults -- Oral Care Recommendations Oral care BID Other Recommendations --   CHL IP FOLLOW UP RECOMMENDATIONS 09/12/2016 Follow up Recommendations Skilled Nursing facility   [facility] IP FREQUENCY AND DURATION 09/12/2016 Speech Therapy Frequency (ACUTE ONLY) min 2x/week Treatment Duration 1 week      CHL IP ORAL PHASE 09/12/2016 Oral Phase Impaired Oral - Pudding Teaspoon -- Oral - Pudding Cup -- Oral - Honey Teaspoon -- Oral - Honey Cup -- Oral - Nectar Teaspoon -- Oral - Nectar Cup Piecemeal swallowing Oral - Nectar Straw Piecemeal  swallowing Oral - Thin Teaspoon -- Oral - Thin Cup Piecemeal swallowing Oral - Thin Straw Piecemeal swallowing Oral - Puree Piecemeal swallowing Oral - Mech Soft -- Oral - Regular Piecemeal swallowing Oral - Multi-Consistency -- Oral - Pill Piecemeal swallowing Oral Phase - Comment --  CHL IP PHARYNGEAL PHASE 09/12/2016 Pharyngeal Phase Impaired Pharyngeal- Pudding Teaspoon -- Pharyngeal -- Pharyngeal- Pudding Cup -- Pharyngeal -- Pharyngeal- Honey Teaspoon -- Pharyngeal -- Pharyngeal- Honey Cup -- Pharyngeal -- Pharyngeal- Nectar Teaspoon -- Pharyngeal -- Pharyngeal- Nectar Cup Delayed swallow initiation-pyriform sinuses Pharyngeal -- Pharyngeal- Nectar Straw Delayed swallow initiation-pyriform sinuses Pharyngeal Material does not enter airway Pharyngeal- Thin Teaspoon -- Pharyngeal -- Pharyngeal- Thin Cup Delayed swallow initiation-pyriform sinuses Pharyngeal Material does not enter airway Pharyngeal- Thin Straw Delayed swallow initiation-pyriform sinuses Pharyngeal -- Pharyngeal- Puree Delayed swallow initiation-vallecula Pharyngeal -- Pharyngeal- Mechanical Soft Delayed swallow initiation-vallecula Pharyngeal -- Pharyngeal- Regular Delayed swallow initiation-vallecula Pharyngeal -- Pharyngeal- Multi-consistency -- Pharyngeal -- Pharyngeal- Pill Delayed swallow initiation-pyriform sinuses Pharyngeal -- Pharyngeal Comment --  CHL IP CERVICAL ESOPHAGEAL PHASE 09/12/2016 Cervical Esophageal Phase WFL Pudding Teaspoon -- Pudding Cup -- Honey Teaspoon -- Honey Cup -- Nectar Teaspoon -- Nectar Cup -- Nectar Straw -- Thin Teaspoon -- Thin Cup -- Thin Straw -- Puree -- Mechanical Soft -- Regular -- Multi-consistency -- Pill -- Cervical Esophageal Comment -- No flowsheet data found. [name], [place] CCC-SLP [phone] Lynann Beaver 09/12/2016, 10:32 AM                   Scheduled Meds: . amiodarone  400 mg Oral BID  . aspirin EC  81 mg Oral Daily  . atorvastatin  40 mg Oral q1800  . carvedilol  12.5  mg Oral BID WC  . chlorhexidine  15 mL Mouth Rinse BID  . docusate  100 mg Oral Daily  . famotidine  20 mg Oral BID  . feeding supplement (GLUCERNA SHAKE)  237 mL Oral Q24H  . furosemide  20 mg Oral Daily  . heparin  5,000 Units Subcutaneous Q8H  . insulin aspart  0-9 Units Subcutaneous TID WC  . lisinopril  5 mg Oral Daily  . mouth rinse  15 mL Mouth Rinse q12n4p  . multivitamin with minerals  1 tablet Oral Daily  . piperacillin-tazobactam (ZOSYN)  IV  3.375 g Intravenous Q8H  . potassium chloride  40 mEq Oral Once  . sodium chloride flush  3 mL Intravenous Q12H   Continuous Infusions:    LOS: 17 days       Temima Kutsch, MD [facility] Pager [phone]  If 7PM-7AM, please contact night-coverage [email or web address] Password [email or web address] 09/13/2016, 10:26 AM

## 2016-09-13 NOTE — Progress Notes (Signed)
Patient Name: Dean [name] Date of Encounter: 09/13/2016  Primary Cardiologist: Liane Comber, MD   Hospital Problem List     Principal Problem:   Cardiopulmonary arrest [facility]) Active Problems:   DM2 (diabetes mellitus, type 2) ([place])   Morbid obesity ([place])   Essential hypertension, benign   Acute encephalopathy   Acute respiratory failure ([place])   Uncomplicated asthma   Diabetes mellitus type 2 in obese ([place])   Acute on chronic combined systolic and diastolic CHF (congestive heart failure) (H[facility])   Tachypnea   Hypernatremia   Hypokalemia   Leukocytosis   Acute blood loss anemia   Hypoxemia   NSVT (nonsustained ventricular tachycardia) ([facility])   Chronic respiratory failure with hypoxia (H[facility])   Fever   Acute cystitis without hematuria   Bacteremia   Subjective   Up eating breakfast. No chest pain or dyspnea  Inpatient Medications    . amiodarone  400 mg Oral BID  . aspirin EC  81 mg Oral Daily  . atorvastatin  40 mg Oral q1800  . carvedilol  12.5 mg Oral BID WC  . chlorhexidine  15 mL Mouth Rinse BID  . docusate  100 mg Oral Daily  . famotidine  20 mg Oral BID  . feeding supplement (GLUCERNA SHAKE)  237 mL Oral Q24H  . furosemide  20 mg Oral Daily  . heparin  5,000 Units Subcutaneous Q8H  . insulin aspart  0-9 Units Subcutaneous TID WC  . lisinopril  2.5 mg Oral Daily  . mouth rinse  15 mL Mouth Rinse q12n4p  . multivitamin with minerals  1 tablet Oral Daily  . piperacillin-tazobactam (ZOSYN)  IV  3.375 g Intravenous Q8H  . sodium chloride flush  3 mL Intravenous Q12H    Vital Signs    Vitals:   09/12/16 1200 09/12/16 1925 09/13/16 0001 09/13/16 0512  BP: 127/66 (!) 122/53  (!) 148/45  Pulse: 76 (!) 58  (!) 57  Resp: 20 18  18   Temp: 98.4 F (36.9 C) 98.6 F (37 C) 98.4 F (36.9 C) 97.9 F (36.6 C)  TempSrc: Oral Oral Oral Oral  SpO2: 92% 98%  98%  Weight:    253 lb 9.6 oz (115 kg)  Height:        Intake/Output Summary (Last 24 hours) at 09/13/16  0836 Last data filed at 09/13/16 0524  Gross per 24 hour  Intake                3 ml  Output              350 ml  Net             -347 ml   Filed Weights   09/11/16 0516 09/12/16 0556 09/13/16 0512  Weight: 255 lb 11.2 oz (116 kg) 260 lb 3.2 oz (118 kg) 253 lb 9.6 oz (115 kg)    Physical Exam   GEN: Well nourished, obese, in no acute distress.  HEENT: Grossly normal.  Neck: Supple Cardiac: RRR Respiratory:  CTA GI: Soft, nontender, nondistended MS: no deformity or atrophy. Skin: warm and dry, no rash. Neuro:  Strength and sensation are intact. Psych: AAOx3.  Normal affect.  Labs    CBC  Recent Labs  09/12/16 0540 09/13/16 0402  WBC 8.1 9.4  NEUTROABS 4.6  --   HGB 10.8* 10.2*  HCT 34.4* 33.0*  MCV 83.3 85.1  PLT 288 [id number]   Basic Metabolic Panel  Recent Labs  09/12/16 0540 09/13/16  0402  NA 137 135  K 4.0 3.6  CL 104 104  CO2 25 22  GLUCOSE 110* 118*  BUN 14 13  CREATININE 1.34* 1.12  CALCIUM 9.6 9.3  MG 1.9  --     Telemetry    Sinus bradycardia- personally reviewed  Radiology    Dg Swallowing Func-speech Pathology  Result Date: 09/12/2016 Objective Swallowing Evaluation: Type of Study: MBS-Modified Barium Swallow Study Patient Details Name: Dean [name] MRN: [id number] Date of Birth: [birth year] Today's Date: 09/12/2016 Time: SLP Start Time (ACUTE ONLY): 1000-SLP Stop Time (ACUTE ONLY): 1016 SLP Time Calculation (min) (ACUTE ONLY): 16 min Past Medical History: Past Medical History: Diagnosis Date . Arthritis   "maybe in his spine" (09/05/2016) . Asthma  . Cardiac arrest ([place]) 08/27/2016  REQUIRING CPR, SHOCK & MEDICATIONS . Gout  . High cholesterol  . Hypertension  . OSA on CPAP  . Type II diabetes mellitus ([place])  Past Surgical History: Past Surgical History: Procedure Laterality Date . CARDIAC CATHETERIZATION N/A 09/07/2016  Procedure: Left Heart Cath and Coronary Angiography;  Surgeon: Peter M Martinique, MD;  Location: [facility];  Service:  Cardiovascular;  Laterality: N/A; . NO PAST SURGERIES   HPI: Pt is a 65 y.o. male with PMH of asthma, DM, gout, HTN who callapsed pulseless on 12/3 requiring CPR x 21 minutes, 7 rounds of epi, 5 shocks, amiodarone and magnesium, was intubated. EEG 12/4 was markedly abnormal wiht severe diffuse cerebral dysfunction. Pt was extubated 12/7. Pt reportedly doing better on this date, family wanting him to eat. Bedside swallow eval ordered. CXR 12/7 showed increasing bibasilar atelectasis and small left effusion. Subjective: pt alert, pleasant, eager for POs Assessment / Plan / Recommendation CHL IP CLINICAL IMPRESSIONS 09/12/2016 Therapy Diagnosis Suspected primary esophageal dysphagia Clinical Impression Pt demonstrates significant improvement in swallow function, only mild, possibly baseline impairments remain. Pt is noted to have piecemeal transit of all bolus, though he always independently clears oral cavity and swallow transited residuals. Swallow initiation is intermittently delayed with all liquids, though timing improves with consecutive straw sips. No penetration or aspiration observed even when pressed to a rapid rate and with large boluses. Pt did cough consistently after most swallows, but no penetration or pharyngeal residual accounts for this. Esophageal sweep showed appearance of distal stasis which could account for coughing. Pt may upgrade to a regular diet and thin liquids, though upright posture is still essential. During this test, pt was fully upright in a chair. SLP will follow for tolerance.  Impact on safety and function Mild aspiration risk   CHL IP TREATMENT RECOMMENDATION 09/12/2016 Treatment Recommendations Therapy as outlined in treatment plan below   Prognosis 09/05/2016 Prognosis for Safe Diet Advancement Good Barriers to Reach Goals Cognitive deficits Barriers/Prognosis Comment -- CHL IP DIET RECOMMENDATION 09/12/2016 SLP Diet Recommendations Regular solids;Thin liquid Liquid  Administration via Cup;Straw Medication Administration Whole meds with liquid Compensations -- Postural Changes Remain semi-upright after after feeds/meals (Comment);Seated upright at 90 degrees   CHL IP OTHER RECOMMENDATIONS 09/12/2016 Recommended Consults -- Oral Care Recommendations Oral care BID Other Recommendations --   CHL IP FOLLOW UP RECOMMENDATIONS 09/12/2016 Follow up Recommendations Skilled Nursing facility   [facility] IP FREQUENCY AND DURATION 09/12/2016 Speech Therapy Frequency (ACUTE ONLY) min 2x/week Treatment Duration 1 week      CHL IP ORAL PHASE 09/12/2016 Oral Phase Impaired Oral - Pudding Teaspoon -- Oral - Pudding Cup -- Oral - Honey Teaspoon -- Oral - Honey Cup -- Oral - Nectar Teaspoon --  Oral - Nectar Cup Piecemeal swallowing Oral - Nectar Straw Piecemeal swallowing Oral - Thin Teaspoon -- Oral - Thin Cup Piecemeal swallowing Oral - Thin Straw Piecemeal swallowing Oral - Puree Piecemeal swallowing Oral - Mech Soft -- Oral - Regular Piecemeal swallowing Oral - Multi-Consistency -- Oral - Pill Piecemeal swallowing Oral Phase - Comment --  CHL IP PHARYNGEAL PHASE 09/12/2016 Pharyngeal Phase Impaired Pharyngeal- Pudding Teaspoon -- Pharyngeal -- Pharyngeal- Pudding Cup -- Pharyngeal -- Pharyngeal- Honey Teaspoon -- Pharyngeal -- Pharyngeal- Honey Cup -- Pharyngeal -- Pharyngeal- Nectar Teaspoon -- Pharyngeal -- Pharyngeal- Nectar Cup Delayed swallow initiation-pyriform sinuses Pharyngeal -- Pharyngeal- Nectar Straw Delayed swallow initiation-pyriform sinuses Pharyngeal Material does not enter airway Pharyngeal- Thin Teaspoon -- Pharyngeal -- Pharyngeal- Thin Cup Delayed swallow initiation-pyriform sinuses Pharyngeal Material does not enter airway Pharyngeal- Thin Straw Delayed swallow initiation-pyriform sinuses Pharyngeal -- Pharyngeal- Puree Delayed swallow initiation-vallecula Pharyngeal -- Pharyngeal- Mechanical Soft Delayed swallow initiation-vallecula Pharyngeal -- Pharyngeal- Regular Delayed  swallow initiation-vallecula Pharyngeal -- Pharyngeal- Multi-consistency -- Pharyngeal -- Pharyngeal- Pill Delayed swallow initiation-pyriform sinuses Pharyngeal -- Pharyngeal Comment --  CHL IP CERVICAL ESOPHAGEAL PHASE 09/12/2016 Cervical Esophageal Phase WFL Pudding Teaspoon -- Pudding Cup -- Honey Teaspoon -- Honey Cup -- Nectar Teaspoon -- Nectar Cup -- Nectar Straw -- Thin Teaspoon -- Thin Cup -- Thin Straw -- Puree -- Mechanical Soft -- Regular -- Multi-consistency -- Pill -- Cervical Esophageal Comment -- No flowsheet data found. [name], MA CCC-SLP [phone] Lynann Beaver 09/12/2016, 10:32 AM               2D Echocardiogram 12.4.2017 Study Conclusions - Left ventricle: The cavity size was mildly dilated. Wall   thickness was increased in a pattern of severe LVH. Systolic   function was moderately reduced. The estimated ejection fraction   was in the range of 35% to 40%. Akinesis of the basalinferior and   apical myocardium. Features are consistent with a pseudonormal   left ventricular filling pattern, with concomitant abnormal   relaxation and increased filling pressure (grade 2 diastolic   dysfunction). - Aortic valve: There was mild regurgitation. - Mitral valve: There was mild regurgitation. - Left atrium: The atrium was severely dilated.   09/07/16 Left Heart Cath and Coronary Angiography  Conclusion    There is moderate left ventricular systolic dysfunction.  LV end diastolic pressure is normal.  The left ventricular ejection fraction is 35-45% by visual estimate.  Ost RCA lesion, 90 %stenosed.  Mid RCA to Dist RCA lesion, 100 %stenosed.   1. Single vessel occlusive CAD. The distal RCA is occluded with left to right collaterals. There is a 90% ostial stenosis 2. Moderate LV dysfunction with EF 35-40%. Akinesis of the basal to mid inferior wall 3. Normal LVEDP  Plan: medical therapy. If he has significant angina we could consider PCI of the RCA  but there appears to be significant scar of the inferior wall.       Patient Profile     This is a 65 y.o.malewith a history of type 2 diabetes, asthma and hypertension and presented after a witnessed cardiac arrest.  He had prolonged CPR.  He has chronic LBBB.  EF 35-40%. Cath with occluded RCA treated medically.  Assessment & Plan    1.  Status post cardiac arrest:  Admitted 12/3 following cardiac arrest.  Cath shows occluded RCA treated medically . Cont  blocker, ACEi, ASA and statin. Will need ICD timing per EP.   2. CAD with CTO RCA with  L-->R collaterals: Plan medical therapy. Continue ASA, statin and BB.  If he has significant angina we could consider PCI of the RCA but there appears to be significant scar of the inferior wall.  3. Acute systolic CHF/presumed ICM:  EF 35-40%.  Continue lasix 20mg  daily; euvolemic. Continue ACEi and BB.  4.  Essential HTN:  BP still mildly elevated on Coreg 12.5mg  BID, lisinopril 2.5mg  daily and lasix 20mg  daily. Will increase Lisinopril to 5mg  daily.   5.  HLD:  LDL 105. Lipitor has been added  6. NSVT:  Improved; continue amiodarone loading with 400mg  BID; for ICD when bacteremia improves.  7. Bacteremia: on Vanc/Zosyn per IM.  Signed, Angelena Form PA-C 09/13/2016, 8:36 AM  As above, patient seen and examined. He denies chest pain or dyspnea. Plan to continue present medications for coronary artery disease. He is on antibiotics for his bacteremia. We will need input from electrophysiology concerning timing of ICD versus discharge with a life vest and ICD later. Kirk Ruths, MD

## 2016-09-13 NOTE — Progress Notes (Signed)
Physical Therapy Treatment Patient Details Name: Dean [name] MRN: [id number] DOB: Jun 23, 1951 Today's Date: 09/13/2016    History of Present Illness Pt is a 65 y.o. male with PMH of asthma, DM, gout, HTN who callapsed pulseless on 12/3. No CPR x 10 minutes then requiring CPR x 21 minutes, 7 rounds of epi, 5 shocks, amiodarone and magnesium, was intubated. EEG 12/4 was markedly abnormal wiht severe diffuse cerebral dysfunction. Pt was extubated 12/7,  PMH - asthma, DM, HTN, gout    PT Comments    Patient seen for activity progression. Tolerated EOB activities well and performed ambulation in hall with min guard for safety. VS stable during activity. Will reassess patient and POC following upcoming procedure.   Follow Up Recommendations  CIR (pending procedure)     Equipment Recommendations  Rolling walker with 5" wheels    Recommendations for Other Services OT consult     Precautions / Restrictions Precautions Precautions: Fall Restrictions Weight Bearing Restrictions: No    Mobility  Bed Mobility Overal bed mobility: Needs Assistance Bed Mobility: Supine to Sit;Sit to Supine     Supine to sit: Min guard;HOB elevated Sit to supine: Min guard   General bed mobility comments: Increased time to perform, cues to initiate mobility to EOB, and for return to supine with repositioning  Transfers Overall transfer level: Needs assistance Equipment used: Rolling walker (2 wheeled) Transfers: Sit to/from Stand Sit to Stand: Min guard         General transfer comment: VCs for safe hand placement and positioning prior to elevation to upright. bed level elevated. Min guard for safety upon transition.  Ambulation/Gait Ambulation/Gait assistance: Min guard Ambulation Distance (Feet): 120 Feet Assistive device: Rolling walker (2 wheeled) Gait Pattern/deviations: Step-through pattern;Decreased stride length;Drifts right/left Gait velocity: decreased Gait velocity  interpretation: Below normal speed for age/gender General Gait Details: some modest instability noted with ambulation, 2 standing rest breaks to assess VS, saturations and HR stable on room air. No evidence of DOE noted   Financial trader Rankin (Stroke Patients Only)       Balance Overall balance assessment: Needs assistance Sitting-balance support: Feet supported;No upper extremity supported Sitting balance-Leahy Scale: Good Sitting balance - Comments: Able to sit EOB and perform dynmaic LE ROM without difficulty   Standing balance support: Single extremity supported Standing balance-Leahy Scale: Poor Standing balance comment: reliance on RW for UE support in static standing. + dizziness initialy                    Cognition Arousal/Alertness: Awake/alert Behavior During Therapy: Flat affect Overall Cognitive Status: Impaired/Different from baseline Area of Impairment: Safety/judgement         Safety/Judgement: Decreased awareness of safety          Exercises      General Comments        Pertinent Vitals/Pain Pain Assessment: No/denies pain Faces Pain Scale: No hurt    Home Living                      Prior Function            PT Goals (current goals can now be found in the care plan section) Acute Rehab PT Goals Patient Stated Goal: eat and drink PT Goal Formulation: With patient Time For Goal Achievement: 09/18/16 Potential to Achieve Goals: Good Progress towards PT goals: Progressing toward goals  Frequency    Min 3X/week      PT Plan Current plan remains appropriate (will reassess post procedure)    Co-evaluation             End of Session Equipment Utilized During Treatment: Gait belt Activity Tolerance: Patient limited by fatigue Patient left: in bed;with call bell/phone within reach;with bed alarm set (video sitter system on in room)     Time: 1010-1031 PT Time  Calculation (min) (ACUTE ONLY): 21 min  Charges:  $Gait Training: 8-22 mins                    G Codes:      Duncan Dull 09-21-2016, 2:26 PM Alben Deeds, [place] DPT  [phone]

## 2016-09-13 NOTE — Progress Notes (Signed)
SUBJECTIVE: The patient is doing well today.  At this time, he denies chest pain, shortness of breath, or any new concerns.  [name] amiodarone  400 mg Oral BID  . aspirin EC  81 mg Oral Daily  . atorvastatin  40 mg Oral q1800  . carvedilol  12.5 mg Oral BID WC  . chlorhexidine  15 mL Mouth Rinse BID  . docusate  100 mg Oral Daily  . famotidine  20 mg Oral BID  . feeding supplement (GLUCERNA SHAKE)  237 mL Oral Q24H  . furosemide  20 mg Oral Daily  . heparin  5,000 Units Subcutaneous Q8H  . insulin aspart  0-9 Units Subcutaneous TID WC  . iopamidol      . lisinopril  5 mg Oral Daily  . mouth rinse  15 mL Mouth Rinse q12n4p  . multivitamin with minerals  1 tablet Oral Daily  . piperacillin-tazobactam (ZOSYN)  IV  3.375 g Intravenous Q8H  . sodium chloride flush  3 mL Intravenous Q12H     OBJECTIVE: Physical Exam: Vitals:   09/12/16 1200 09/12/16 1925 09/13/16 0001 09/13/16 0512  BP: 127/66 (!) 122/53  (!) 148/45  Pulse: 76 (!) 58  (!) 57  Resp: 20 18  18   Temp: 98.4 F (36.9 C) 98.6 F (37 C) 98.4 F (36.9 C) 97.9 F (36.6 C)  TempSrc: Oral Oral Oral Oral  SpO2: 92% 98%  98%  Weight:    253 lb 9.6 oz (115 kg)  Height:        Intake/Output Summary (Last 24 hours) at 09/13/16 1149 Last data filed at 09/13/16 0830  Gross per 24 hour  Intake              243 ml  Output              350 ml  Net             -107 ml    Telemetry is reviewed by myself, SB/SR, no VT  GEN- The patient non-toxic appearing, in NAD, alert and oriented x 3 today.   Head- normocephalic, atraumatic Eyes-  Sclera clear, conjunctiva pink Ears- hearing intact Oropharynx- clear Neck- supple, no JVP Lungs- Clear to ausculation bilaterally, normal work of breathing Heart- Regular rate and rhythm, no significant murmurs, no rubs or gallops GI- soft, NT, ND Extremities- no clubbing, cyanosis, or edema Skin- no rash or lesion Psych- euthymic mood, full affect Neuro- no gross deficits  appreciated  LABS: Basic Metabolic Panel:  Recent Labs  09/12/16 0540 09/13/16 0402  NA 137 135  K 4.0 3.6  CL 104 104  CO2 25 22  GLUCOSE 110* 118*  BUN 14 13  CREATININE 1.34* 1.12  CALCIUM 9.6 9.3  MG 1.9  --    Liver Function Tests:  Recent Labs  09/12/16 0540  AST 51*  ALT 59  ALKPHOS 91  BILITOT 1.1  PROT 6.7  ALBUMIN 2.7*   CBC:  Recent Labs  09/12/16 0540 09/13/16 0402  WBC 8.1 9.4  NEUTROABS 4.6  --   HGB 10.8* 10.2*  HCT 34.4* 33.0*  MCV 83.3 85.1  PLT 288 260     ASSESSMENT AND PLAN:  1. Witnessed cardiac arrest, prolonged down time of at least 62minutes S/p hypothermia protocol Initially treated with 450 mg of amiodarone as well as 2 g of magnesium and 7 injections of epinephrine LVEF 35-40% by echo and LVgram on amiodarone 400mg  BID  No further VT  The patient developed fever the weekend though has been afebrile for 48hours, BC + for bactremia ID panel noted, Enterobacteriaceae species detected,  Discussed with medicine, Lio Wehrly hold off ICD implant and arrange life vest for now, Keiasia Christianson plan office f/u in the next few weeks, I have discussed this with the patient, he is agreeable. Continue amiodarone 400mg  BID for total of 2 weeks, then decrease to 200mg  BID for 2 weeks, then 200mg  once daily  2. CAD No intervention of 100% RCA Management with primary cardiology  3. Hypokalemia  Keep K+ >/= 4.0 (and mag >/= 2.0)  4. CHF Cumulative negative - 3093ml for the hospitalization  5. HTN, the patient's wife reports his BP has never been able to get controlled Severe LVH on his echo Stable on current regime  6. Fevers, bacteremia     C/w internal medicine service  7. Waxing/waning confusion     much better today   EP remains available, please call with questions.  [name], PA-C 09/13/2016 11:49 AM   I have seen and examined this patient with [name].  Agree with above, note  added to reflect my findings.  On exam, regula rrhythm, no murmurs, lungs clear. Has continued to have issues with infection. Due to infections concerns,  we'll plan to place a LifeVest death prevention. We Alise Calais bring him back to clinic in 2-3 weeks once his infectious issues have resolved and plan for ICD therapy at that time.  Chiquetta Langner M. Deshara Rossi MD 09/13/2016 1:13 PM

## 2016-09-14 LAB — BASIC METABOLIC PANEL
Anion gap: 8 (ref 5–15)
BUN: 13 mg/dL (ref 6–20)
CO2: 24 mmol/L (ref 22–32)
Calcium: 9.5 mg/dL (ref 8.9–10.3)
Chloride: 106 mmol/L (ref 101–111)
Creatinine, Ser: 1.09 mg/dL (ref 0.61–1.24)
GFR calc Af Amer: >60 mL/min (ref >60)
GFR calc non Af Amer: >60 mL/min (ref >60)
Glucose, Bld: 113 mg/dL — ABNORMAL HIGH (ref 65–99)
Potassium: 3.9 mmol/L (ref 3.5–5.1)
Sodium: 138 mmol/L (ref 135–145)

## 2016-09-14 LAB — CULTURE, BLOOD (ROUTINE X 2): Culture: NO GROWTH

## 2016-09-14 LAB — CBC
HCT: 34.5 % — ABNORMAL LOW (ref 39.0–52.0)
Hemoglobin: 10.9 g/dL — ABNORMAL LOW (ref 13.0–17.0)
MCH: 26.3 pg (ref 26.0–34.0)
MCHC: 31.6 g/dL (ref 30.0–36.0)
MCV: 83.1 fL (ref 78.0–100.0)
Platelets: 289 10*3/uL (ref 150–400)
RBC: 4.15 MIL/uL — ABNORMAL LOW (ref 4.22–5.81)
RDW: 13.9 % (ref 11.5–15.5)
WBC: 10 10*3/uL (ref 4.0–10.5)

## 2016-09-14 LAB — GLUCOSE, CAPILLARY
Glucose-Capillary: 107 mg/dL — ABNORMAL HIGH (ref 65–99)
Glucose-Capillary: 114 mg/dL — ABNORMAL HIGH (ref 65–99)
Glucose-Capillary: 112 mg/dL — ABNORMAL HIGH (ref 65–99)
Glucose-Capillary: 114 mg/dL — ABNORMAL HIGH (ref 65–99)

## 2016-09-14 LAB — PROCALCITONIN: Procalcitonin: 0.38 ng/mL

## 2016-09-14 MED ORDER — DEXTROSE 5 % IV SOLN
2.0000 g | INTRAVENOUS | Status: DC
  Administered 2016-09-14 – 2016-09-15 (×2): 2 g via INTRAVENOUS
  Filled 2016-09-14 (×2): qty 2

## 2016-09-14 NOTE — Progress Notes (Signed)
Occupational Therapy Treatment Patient Details Name: Dean [name] MRN: [id number] DOB: 1950-12-26 Today's Date: 09/14/2016    History of present illness Pt is a 65 y.o. male with PMH of asthma, DM, gout, HTN who callapsed pulseless on 12/3. No CPR x 10 minutes then requiring CPR x 21 minutes, 7 rounds of epi, 5 shocks, amiodarone and magnesium, was intubated. EEG 12/4 was markedly abnormal wiht severe diffuse cerebral dysfunction. Pt was extubated 12/7,  PMH - asthma, DM, HTN, gout   OT comments  Pt making progress with functional goals. Possible d/c to CIR today  Follow Up Recommendations  CIR;Supervision/Assistance - 24 hour    Equipment Recommendations  3 in 1 bedside commode;Tub/shower seat    Recommendations for Other Services      Precautions / Restrictions Precautions Precautions: Fall Restrictions Weight Bearing Restrictions: No       Mobility Bed Mobility Overal bed mobility: Independent Bed Mobility: Supine to Sit;Sit to Supine     Supine to sit: Min guard;HOB elevated Sit to supine: Min guard   General bed mobility comments: Increased time to perform,  mod verbal cues to initiate mobility to EOB  Transfers Overall transfer level: Needs assistance Equipment used: Rolling walker (2 wheeled) Transfers: Sit to/from Stand Sit to Stand: Min guard              Balance Overall balance assessment: Needs assistance   Sitting balance-Leahy Scale: Good       Standing balance-Leahy Scale: Fair                     ADL Overall ADL's : Needs assistance/impaired     Grooming: Wash/dry hands;Min guard;Standing;Wash/dry face   Upper Body Bathing: Min guard;Sitting (simulated)   Lower Body Bathing: Moderate assistance;Sit to/from stand;Minimal assistance;Sitting/lateral leans (simulated)   Upper Body Dressing : Min guard;Standing   Lower Body Dressing: Min guard Lower Body Dressing Details (indicate cue type and reason): donned socks sitting  EOB, required increased time and effort Toilet Transfer: Min guard;Ambulation;RW;Grab bars;Cueing for safety   Toileting- Clothing Manipulation and Hygiene: Sit to/from stand;Minimal assistance   Tub/ Shower Transfer: Min guard;Grab bars;Ambulation;Rolling walker   Functional mobility during ADLs: Rolling walker;Min guard;Cueing for safety                                        Cognition   Behavior During Therapy: WFL for tasks assessed/performed Overall Cognitive Status: Impaired/Different from baseline Area of Impairment: Safety/judgement          Safety/Judgement: Decreased awareness of safety                                     General Comments  pt pleasant and cooperative    Pertinent Vitals/ Pain       Pain Assessment: No/denies pain                                                          Frequency  Min 2X/week        Progress Toward Goals  OT Goals(current goals can now be found in the care plan section)  Progress towards  OT goals: Progressing toward goals     Plan Discharge plan remains appropriate                     End of Session Equipment Utilized During Treatment: Gait belt;Rolling walker   Activity Tolerance Patient tolerated treatment well   Patient Left in bed;with call bell/phone within reach;with bed alarm set   Nurse Communication          Time:  -     Charges: OT General Charges $OT Visit: 1 Procedure OT Treatments $Self Care/Home Management : 8-22 mins $Therapeutic Activity: 8-22 mins  Britt Bottom 09/14/2016, 1:34 PM

## 2016-09-14 NOTE — Progress Notes (Signed)
Gene from inpatient rehab called and stated there is a bed ready for patient when he is discharged. Called [facility] and talked to [place] for [employer] update and he stated he would call back when he had more information for arrival.   Nilda Simmer

## 2016-09-14 NOTE — Progress Notes (Signed)
Speech Language Pathology Treatment: Dysphagia  Patient Details Name: Dean [name] MRN: [id number] DOB: November 19, 1950 Today's Date: 09/14/2016 Time: [id number] SLP Time Calculation (min) (ACUTE ONLY): 16 min  Assessment / Plan / Recommendation Clinical Impression  Pt seen for assessment of diet tolerance following MBS completed 09/12/16. Pt was observed during lunch of regular solids and thin liquids. No overt s/s aspiration observed or reported. Recommend continuing with current diet. ST will follow for diet tolerance.   HPI HPI: Pt is a 65 y.o. male with PMH of asthma, DM, gout, HTN who callapsed pulseless on 12/3 requiring CPR x 21 minutes, 7 rounds of epi, 5 shocks, amiodarone and magnesium, was intubated. EEG 12/4 was markedly abnormal wiht severe diffuse cerebral dysfunction. Pt was extubated 12/7. Pt reportedly doing better on this date, family wanting him to eat. Bedside swallow eval ordered. CXR 12/7 showed increasing bibasilar atelectasis and small left effusion.      SLP Plan   Continue current plan of care    Recommendations  Diet recommendations: Regular;Thin liquid Liquids provided via: Cup;Straw Medication Administration: Whole meds with liquid Supervision: Patient able to self feed Compensations: Slow rate;Small sips/bites;Minimize environmental distractions Postural Changes and/or Swallow Maneuvers: Seated upright 90 degrees;Upright 30-60 min after meal                        GO              Gricel Copen B. Ray, [facility], [place]  Shonna Chock 09/14/2016, 1:12 PM

## 2016-09-14 NOTE — Progress Notes (Signed)
[employer] representative at bedside. Called Gene from inpatient rehab, she stated patient can not be transferred at this time but should be expected to in the morning. MD Elgergawy paged to inform.   Dean [name]

## 2016-09-14 NOTE — Progress Notes (Signed)
PROGRESS NOTE  Phoenixx Hochberg  [id number] DOB: 1950-10-31  DOA: 08/27/2016 PCP: [name], MD   Brief Narrative:  65 yo MO (310 lbs 5'5") AAM who was walking back from the bathroom 12/3 when he collapsed pulseless. CPR wa not performed by family and EMS arrived in 10 minutes and performed CPR x 21 minutes, 7 rounds of epi, 5 shocks, amiodarone and magnesium. He coded again with ROSC. King airway was changed by EDP. He was taken to CT scanner with NAD of brain, transfer to CCU and cardiology was called. Hypothermia protocol was initiated at 1545 after ct head. After clinical improvement and stabilization, care transferred from [facility] to [facility] on 09/06/16. Clinically improved >CHF compensated, hypoxia resolved, mental status steadily improving. Status post cath. EP recommends ICD , plan currently of insertion on hold given bacteremia , and plan to place [employer] death prevention  SIGNIFICANT EVENTS: 12/3 Cardiac arrest  12/5 > Re-Warmed, off sedation  12/7 extubated  12/8 Acutely confused overnight, pulled CVC, ativan ordered. Mittens in place  12/9 - wife and daughter at bedside - feel encephalopathy is rapidly improving. sTill sleepy but much better. They think he is ready to eat. STill per RN can slip out of bed and needs siter. No on infusion. opulse ox 97% 2L [place]. 94% on RA x 14min  Assessment & Plan:   Principal Problem:   Cardiopulmonary arrest ([place]) Active Problems:   DM2 (diabetes mellitus, type 2) ([place])   Morbid obesity ([place])   Essential hypertension, benign   Acute encephalopathy   Acute respiratory failure ([place])   Uncomplicated asthma   Diabetes mellitus type 2 in obese ([place])   Acute on chronic combined systolic and diastolic CHF (congestive heart failure) (HCC)   Tachypnea   Hypernatremia   Hypokalemia   Leukocytosis   Acute blood loss anemia   Hypoxemia   NSVT (nonsustained ventricular tachycardia) (H[facility])   Chronic respiratory failure with hypoxia (H[facility])   Fever   Acute  cystitis without hematuria   Bacteremia due to Gram-negative bacteria  Status post cardiac arrest 08/27/16 with 31 minutes down:  - VT/VF arrest,? Secondary to respiratory process. Cardiology following. Underwent cardiac cath 12/14 and detailed results as below. 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 Medical management recommended by cardiology.  -EP follow-up appreciated , ICD implant for 12/18 as been counseled giving Citrobacter bacteremia,  plan currently of insertion on hold given bacteremia , and plan to place [employer] death prevention.  NSVT: - EP consultation appreciated. On coreg, Amiodarone(decrease amiodarone to 200 mg daily in 1 week) . AICD when able.plan to place [employer] death prevention. Hopefully can be fitted today. -  Attempt to keep K>4, Mg > 2.  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 - Currently treated with IV Zosyn, pansensitive, was transitioned to Rocephin, continue total of 14 days of antibiotics, can be transitioned to Bactrim on discharge, discussed with ID. - Surveillance cultures remains negative.  Acute respiratory failure with hypoxia:  - Extubated 12/7. Hypoxia resolved.  Acute kidney injury:  - Resolved.  Hypokalemia:  - Attempt to keep potassium >4 and magnesium >2   Dysphagia:  - Followed by his P, currently on regular solids with thin liquid  DM type II:  - Continue SSI. Good inpatient control.  Essential hypertension: -  controlled. Continue with Coreg and lisinopril  Acute encephalopathy: -  Agitation/delirium versus anoxic injury. CT head negative. Avoid sedative and QT prolonging  medications. Precedex discontinued 09/05/16. Improving. As per daughter at bedside on 12/18, mental status has improved and almost close to baseline.  History of asthma:  - Stable. Status post extubation 08/31/16. As per family at bedside on 12/14, mental status gradually improving.  Prolonged QTC:  - 508 ms on 09/02/16 and down to 473 ms on 09/04/16.  ? OSA:  - Has been on CPAP at bedtime.    DVT prophylaxis: Heparin Code Status: Full Family Communication: Brother at bedside. Disposition Plan: PT is recommending CIR, awaiting insurance approval, as well as awaiting fitting for LifeVest   Consultants:   CCM-signed off  Cardiology/EP  Procedures:  12/3 ET>>12/7 12/3 left I J CVL>>12/8 2-D echo 08/28/16: Study Conclusions  - Left ventricle: The cavity size was mildly dilated. Wall   thickness was increased in a pattern of severe LVH. Systolic   function was moderately reduced. The estimated ejection fraction   was in the range of 35% to 40%. Akinesis of the basalinferior and   apical myocardium. Features are consistent with a pseudonormal   left ventricular filling pattern, with concomitant abnormal   relaxation and increased filling pressure (grade 2 diastolic   dysfunction). - Aortic valve: There was mild regurgitation. - Mitral valve: There was mild regurgitation. - Left atrium: The atrium was severely dilated.   Left Heart Cath and Coronary Angiography  Conclusion     There is moderate left ventricular systolic dysfunction.  LV end diastolic pressure is normal.  The left ventricular ejection fraction is 35-45% by visual estimate.  Ost RCA lesion, 90 %stenosed.  Mid RCA to Dist RCA lesion, 100 %stenosed.   1. Single vessel occlusive CAD. The distal RCA is occluded with left to right collaterals. There is a 90% ostial stenosis 2. Moderate LV dysfunction with EF 35-40%. Akinesis of the basal to mid inferior wall 3. Normal LVEDP  Plan: medical therapy. If  he has significant angina we could consider PCI of the RCA but there appears to be significant scar of the inferior wall.       Antimicrobials:   IV Levofloxacin 12/15> 12/17  IV Zosyn 12/17>   Subjective: Seen this morning. Denied complaints.  mental status is almost returned to baseline. Afebrile . Objective:  Vitals:   09/13/16 0512 09/13/16 1200 09/13/16 2017 09/14/16 0611  BP: (!) 148/45 (!) 135/35 (!) 123/45 (!) 124/24  Pulse: (!) 57 61 (!) 55 (!) 57  Resp: 18 20 18 18   Temp: 97.9 F (36.6 C) 98.3 F (36.8 C) 98 F (36.7 C) 97.8 F (36.6 C)  TempSrc: Oral Oral Oral Oral  SpO2: 98% 97% 99% 97%  Weight: 115 kg (253 lb 9.6 oz)   117.1 kg (258 lb 3.2 oz)  Height:        Intake/Output Summary (Last 24 hours) at 09/14/16 1043 Last data filed at 09/14/16 0900  Gross per 24 hour  Intake             1327 ml  Output              200 ml  Net             1127 ml   Filed Weights   09/12/16 0556 09/13/16 0512 09/14/16 0611  Weight: 118 kg (  260 lb 3.2 oz) 115 kg (253 lb 9.6 oz) 117.1 kg (258 lb 3.2 oz)    Examination:  General exam: Pleasant middle-aged male sitting up comfortably In bed. Does not look septic or toxic. Respiratory system: clear to auscultation. Respiratory effort normal. Cardiovascular system: S1 & S2 heard, RRR. No JVD, murmurs, rubs, gallops or clicks. No pedal edema. Telemetry: Sinus rhythm.  No further episodes of NSVT in the last 72 hours.  Gastrointestinal system: Abdomen is nondistended, soft and nontender. No organomegaly or masses felt. Normal bowel sounds heard. Central nervous system: Alert and oriented to person, place and partly to time (December 2017) . No focal neurological deficits. Extremities: Symmetric 5 x 5 power. Skin: No rashes, lesions or ulcers Psychiatry: Judgement and insight impaired . Mood & affect appropriate.     Data Reviewed: I have personally reviewed following labs and imaging studies  CBC:  Recent Labs Lab  09/07/16 1155 09/10/16 0444 09/12/16 0540 09/13/16 0402 09/14/16 0444  WBC 13.3* 14.3* 8.1 9.4 10.0  NEUTROABS  --   --  4.6  --   --   HGB 11.9* 12.6* 10.8* 10.2* 10.9*  HCT 37.8* 40.4 34.4* 33.0* 34.5*  MCV 84.8 84.7 83.3 85.1 83.1  PLT 180 182 288 260 [id number]   Basic Metabolic Panel:  Recent Labs Lab 09/08/16 0256 09/10/16 0444 09/12/16 0540 09/13/16 0402 09/14/16 0444  NA 141 139 137 135 138  K 4.2 3.6 4.0 3.6 3.9  CL 106 105 104 104 106  CO2 22 23 25 22 24   GLUCOSE 93 97 110* 118* 113*  BUN 14 14 14 13 13   CREATININE 1.12 1.23 1.34* 1.12 1.09  CALCIUM 9.7 10.2 9.6 9.3 9.5  MG  --   --  1.9  --   --    GFR: Estimated Creatinine Clearance: 80.2 mL/min (by C-G formula based on SCr of 1.09 mg/dL). Liver Function Tests:  Recent Labs Lab 09/08/16 0256 09/12/16 0540  AST 59* 51*  ALT 101* 59  ALKPHOS 88 91  BILITOT 1.8* 1.1  PROT 6.9 6.7  ALBUMIN 3.0* 2.7*   No results for input(s): LIPASE, AMYLASE in the last 168 hours. No results for input(s): AMMONIA in the last 168 hours. Coagulation Profile: No results for input(s): INR, PROTIME in the last 168 hours. Cardiac Enzymes: No results for input(s): CKTOTAL, CKMB, CKMBINDEX, TROPONINI in the last 168 hours. BNP (last 3 results) No results for input(s): PROBNP in the last 8760 hours. HbA1C: No results for input(s): HGBA1C in the last 72 hours. CBG:  Recent Labs Lab 09/13/16 0642 09/13/16 1129 09/13/16 1527 09/13/16 2135 09/14/16 0610  GLUCAP 119* 147* 101* 101* 107*   Lipid Profile: No results for input(s): CHOL, HDL, LDLCALC, TRIG, CHOLHDL, LDLDIRECT in the last 72 hours. Thyroid Function Tests: No results for input(s): TSH, T4TOTAL, FREET4, T3FREE, THYROIDAB in the last 72 hours. Anemia Panel: No results for input(s): VITAMINB12, FOLATE, FERRITIN, TIBC, IRON, RETICCTPCT in the last 72 hours.  Sepsis Labs:  Recent Labs Lab 09/10/16 0708 09/12/16 0540 09/14/16 0444  PROCALCITON 1.03 0.86 0.38      Recent Results (from the past 240 hour(s))  Culture, blood (routine x 2)     Status: Abnormal   Collection Time: 09/09/16  5:20 AM  Result Value Ref Range Status   Specimen Description BLOOD RIGHT HAND  Final   Special Requests BOTTLES DRAWN AEROBIC ONLY [place]  Final   Culture  Setup Time   Final    GRAM  NEGATIVE RODS AEROBIC BOTTLE ONLY CRITICAL RESULT CALLED TO, READ BACK BY AND VERIFIED WITH: A MASTERS,PHARMD AT 1244 09/10/16 BY L BENFIELD    Culture CITROBACTER KOSERI (A)  Final   Report Status 09/13/2016 FINAL  Final   Organism ID, Bacteria CITROBACTER KOSERI  Final      Susceptibility   Citrobacter koseri - MIC*    CEFAZOLIN <=4 SENSITIVE Sensitive     CEFEPIME <=1 SENSITIVE Sensitive     CEFTAZIDIME <=1 SENSITIVE Sensitive     CEFTRIAXONE <=1 SENSITIVE Sensitive     CIPROFLOXACIN <=0.25 SENSITIVE Sensitive     GENTAMICIN <=1 SENSITIVE Sensitive     IMIPENEM <=0.25 SENSITIVE Sensitive     TRIMETH/SULFA <=20 SENSITIVE Sensitive     PIP/TAZO <=4 SENSITIVE Sensitive     * CITROBACTER KOSERI  Blood Culture ID Panel (Reflexed)     Status: Abnormal   Collection Time: 09/09/16  5:20 AM  Result Value Ref Range Status   Enterococcus species NOT DETECTED NOT DETECTED Final   Listeria monocytogenes NOT DETECTED NOT DETECTED Final   Staphylococcus species NOT DETECTED NOT DETECTED Final   Staphylococcus aureus NOT DETECTED NOT DETECTED Final   Streptococcus species NOT DETECTED NOT DETECTED Final   Streptococcus agalactiae NOT DETECTED NOT DETECTED Final   Streptococcus pneumoniae NOT DETECTED NOT DETECTED Final   Streptococcus pyogenes NOT DETECTED NOT DETECTED Final   Acinetobacter baumannii NOT DETECTED NOT DETECTED Final   Enterobacteriaceae species DETECTED (A) NOT DETECTED Final    Comment: CRITICAL RESULT CALLED TO, READ BACK BY AND VERIFIED WITH: A MASTERS,PHARMD AT 1244 09/10/16 BY L BENFIELD    Enterobacter cloacae complex NOT DETECTED NOT DETECTED Final    Escherichia coli NOT DETECTED NOT DETECTED Final   Klebsiella oxytoca NOT DETECTED NOT DETECTED Final   Klebsiella pneumoniae NOT DETECTED NOT DETECTED Final   Proteus species NOT DETECTED NOT DETECTED Final   Serratia marcescens NOT DETECTED NOT DETECTED Final   Carbapenem resistance NOT DETECTED NOT DETECTED Final   Haemophilus influenzae NOT DETECTED NOT DETECTED Final   Neisseria meningitidis NOT DETECTED NOT DETECTED Final   Pseudomonas aeruginosa NOT DETECTED NOT DETECTED Final   Candida albicans NOT DETECTED NOT DETECTED Final   Candida glabrata NOT DETECTED NOT DETECTED Final   Candida krusei NOT DETECTED NOT DETECTED Final   Candida parapsilosis NOT DETECTED NOT DETECTED Final   Candida tropicalis NOT DETECTED NOT DETECTED Final  Culture, blood (routine x 2)     Status: None (Preliminary result)   Collection Time: 09/09/16  5:50 AM  Result Value Ref Range Status   Specimen Description BLOOD RIGHT HAND  Final   Special Requests IN PEDIATRIC BOTTLE 2CC  Final   Culture NO GROWTH 4 DAYS  Final   Report Status PENDING  Incomplete  Culture, Urine     Status: None   Collection Time: 09/09/16  8:11 AM  Result Value Ref Range Status   Specimen Description URINE, RANDOM  Final   Special Requests NONE  Final   Culture NO GROWTH  Final   Report Status 09/10/2016 FINAL  Final  Respiratory Panel by PCR     Status: None   Collection Time: 09/10/16 10:46 AM  Result Value Ref Range Status   Adenovirus NOT DETECTED NOT DETECTED Final   Coronavirus 229E NOT DETECTED NOT DETECTED Final   Coronavirus HKU1 NOT DETECTED NOT DETECTED Final   Coronavirus NL63 NOT DETECTED NOT DETECTED Final   Coronavirus OC43 NOT DETECTED NOT DETECTED Final  Metapneumovirus NOT DETECTED NOT DETECTED Final   Rhinovirus / Enterovirus NOT DETECTED NOT DETECTED Final   Influenza A NOT DETECTED NOT DETECTED Final   Influenza B NOT DETECTED NOT DETECTED Final   Parainfluenza Virus 1 NOT DETECTED NOT DETECTED  Final   Parainfluenza Virus 2 NOT DETECTED NOT DETECTED Final   Parainfluenza Virus 3 NOT DETECTED NOT DETECTED Final   Parainfluenza Virus 4 NOT DETECTED NOT DETECTED Final   Respiratory Syncytial Virus NOT DETECTED NOT DETECTED Final   Bordetella pertussis NOT DETECTED NOT DETECTED Final   Chlamydophila pneumoniae NOT DETECTED NOT DETECTED Final   Mycoplasma pneumoniae NOT DETECTED NOT DETECTED Final  Culture, blood (Routine X 2) w Reflex to ID Panel     Status: None (Preliminary result)   Collection Time: 09/12/16  5:45 AM  Result Value Ref Range Status   Specimen Description BLOOD RIGHT ASSIST CONTROL  Final   Special Requests IN PEDIATRIC BOTTLE 3CC  Final   Culture NO GROWTH 1 DAY  Final   Report Status PENDING  Incomplete  Culture, blood (Routine X 2) w Reflex to ID Panel     Status: None (Preliminary result)   Collection Time: 09/12/16  5:52 AM  Result Value Ref Range Status   Specimen Description BLOOD RIGHT HAND  Final   Special Requests IN PEDIATRIC BOTTLE 2CC  Final   Culture NO GROWTH 1 DAY  Final   Report Status PENDING  Incomplete         Radiology Studies: No results found.      Scheduled Meds: . amiodarone  400 mg Oral BID  . aspirin EC  81 mg Oral Daily  . atorvastatin  40 mg Oral q1800  . carvedilol  12.5 mg Oral BID WC  . chlorhexidine  15 mL Mouth Rinse BID  . docusate  100 mg Oral Daily  . famotidine  20 mg Oral BID  . feeding supplement (GLUCERNA SHAKE)  237 mL Oral Q24H  . furosemide  20 mg Oral Daily  . heparin  5,000 Units Subcutaneous Q8H  . insulin aspart  0-9 Units Subcutaneous TID WC  . lisinopril  5 mg Oral Daily  . mouth rinse  15 mL Mouth Rinse q12n4p  . multivitamin with minerals  1 tablet Oral Daily  . piperacillin-tazobactam (ZOSYN)  IV  3.375 g Intravenous Q8H  . sodium chloride flush  3 mL Intravenous Q12H   Continuous Infusions:    LOS: 18 days       Odyssey Vasbinder, MD [facility] Pager [phone]  If  7PM-7AM, please contact night-coverage [email or web address] Password [facility] 09/14/2016, 10:43 AM

## 2016-09-14 NOTE — Progress Notes (Signed)
As outlined; pt can be DCed on present meds from a cardiac standpoint once life vest in place; would decrease amiodarone to 200 mg daily in one week. FU Dr Curt Bears. Please call with questions. Kirk Ruths, MD

## 2016-09-14 NOTE — PMR Pre-admission (Signed)
PMR Admission Coordinator Pre-Admission Assessment  Patient: Dean [name] is an 65 y.o., male MRN: [id number] DOB: 31-Dec-1950 Height: 5' 6" (167.6 cm) Weight: 116.6 kg (257 lb 1.6 oz) ( scale a)              Insurance Information HMO:     PPO: Yes     PCP:       IPA:       80/20:       OTHER:   PRIMARY: [employer]      Policy#: [id number]      Subscriber: Theophilus Bones CM Name: Quincy Sheehan      Phone#: [phone]     Fax#:  [phone] Pre-Cert#: [id number] for max 9 days to be discharged on or before 09/24/16      Employer: Disabled Benefits:  Phone #: [phone]     Name:  Milinda Pointer. Date:  03/25/12 with term date 09/24/16 and a switch to [employer]     Deduct:  $0      Out of Pocket Max: $5500 (met [phone])      Life Max: Unlimited CIR: $265/day with max $1590 per admit      SNF: $0 days 1-20; $164 days 21-100 Outpatient: medical necessity     Co-Pay: $40/visit Home Health: 100%      Co-Pay: none DME: 80%     Co-Pay: 20% Providers: in network  Emergency Contact Information Contact Information    Name Relation Home Work Tye Spouse [phone]  [phone]   Weitz,[place] Daughter   [phone]   Molitor,Deedee Daughter   [phone]   Cauy, Melody   [phone]     Current Medical History  Patient Admitting Diagnosis: Debilitation/cardiopulmonary arrest/anoxic encephalopathy   History of Present Illness: A 65 y.o.right handed malewith history of asthma, diabetes mellitus, gout and hypertension. Per chart review patient lives with spouse independent prior to admission. One level home. Presented 08/27/2016 after he collapsed walking back from the bathroom. CPR not performed by family and EMS arrived 10 minutes later, performedCPR 21 minutes with 7 rounds of epi, 5 shocks,amiodarone,and magnesium given. He coded again in route to the hospital. Cranial CT scan negative. Troponin 1.54-2.61. Creatinine 1.31. WBC  22,100. EKG showed sinus rhythm with wide QRS/LBBBpossible old anterior MI. Chest x-ray showed resolving interstitial pulmonary edema. He did not meet criteria for STEMIor urgent cardiac cath. Echocardiogram with ejection fraction of 44% grade 2 diastolic dysfunction. Akinesis of the basal inferior and apical myocardium. There was concerns of possible seizure with neurology consulted. EEG with diffuse background suppression but no obvious seizure noted. Patient remained intubated until 08/31/2016. Underwent cardiac catheterization 09/07/2016 showing single-vessel occlusive CAD. The distal RCA occluded with left-to-right collaterals. Recommendations were for medical therapy as well as a life vest being placed. Maintained on subcutaneous heparin for DVT prophylaxis. Tolerating a regular diet. Citrobacter bacteremia treated with intravenous Zosyn and transitioned to Bactrim for a total of 14 days. Physical therapy evaluation completed 09/04/2016 and recommendations of physical medicine rehabilitation consult. Patient to be admitted for a comprehensive inpatient rehabilitation program.  . Past Medical History  Past Medical History:  Diagnosis Date  . Arthritis    "maybe in his spine" (09/05/2016)  . Asthma   . Cardiac arrest ([place]) 08/27/2016   REQUIRING CPR, SHOCK & MEDICATIONS  . Gout   . High cholesterol   . Hypertension   . OSA on CPAP   . Type II diabetes mellitus ([place])     Family  History  family history includes Cancer in his brother; Heart attack in his father; Hypertension in his sister and sister.  Prior Rehab/Hospitalizations: No previous rehab admissions  Has the patient had major surgery during 100 days prior to admission? No  Current Medications   Current Facility-Administered Medications:  .  0.9 %  sodium chloride infusion, 250 mL, Intravenous, PRN, Peter M Martinique, MD .  acetaminophen (TYLENOL) tablet 650 mg, 650 mg, Oral, Q4H PRN, Ritta Slot, NP, 650 mg at 09/10/16 1950 .   amiodarone (PACERONE) tablet 400 mg, 400 mg, Oral, BID, Renee Dyane Dustman, PA-C, 400 mg at 09/15/16 1016 .  aspirin EC tablet 81 mg, 81 mg, Oral, Daily, Lelon Perla, MD, 81 mg at 09/15/16 1016 .  atorvastatin (LIPITOR) tablet 40 mg, 40 mg, Oral, q1800, Lelon Perla, MD, 40 mg at 09/14/16 1652 .  carvedilol (COREG) tablet 12.5 mg, 12.5 mg, Oral, BID WC, Rogelia Mire, NP, 12.5 mg at 09/15/16 0600 .  cefTRIAXone (ROCEPHIN) 2 g in dextrose 5 % 50 mL IVPB, 2 g, Intravenous, Q24H, Albertine Patricia, MD, 2 g at 09/14/16 1339 .  chlorhexidine (PERIDEX) 0.12 % solution 15 mL, 15 mL, Mouth Rinse, BID, Rush Farmer, MD, 15 mL at 09/15/16 1017 .  docusate (COLACE) 50 MG/5ML liquid 100 mg, 100 mg, Oral, Daily, Modena Jansky, MD, 100 mg at 09/15/16 1218 .  famotidine (PEPCID) tablet 20 mg, 20 mg, Oral, BID, Modena Jansky, MD, 20 mg at 09/15/16 1016 .  feeding supplement (GLUCERNA SHAKE) (GLUCERNA SHAKE) liquid 237 mL, 237 mL, Oral, Q24H, Dawood S Elgergawy, MD, 237 mL at 09/14/16 2100 .  furosemide (LASIX) tablet 20 mg, 20 mg, Oral, Daily, Peter M Martinique, MD, 20 mg at 09/15/16 1017 .  heparin injection 5,000 Units, 5,000 Units, Subcutaneous, Q8H, Peter M Martinique, MD, 5,000 Units at 09/15/16 0559 .  hydrALAZINE (APRESOLINE) injection 10 mg, 10 mg, Intravenous, Q6H PRN, Dorothy Spark, MD, 10 mg at 09/08/16 0513 .  insulin aspart (novoLOG) injection 0-9 Units, 0-9 Units, Subcutaneous, TID WC, Modena Jansky, MD, 1 Units at 09/13/16 1246 .  lisinopril (PRINIVIL,ZESTRIL) tablet 5 mg, 5 mg, Oral, Daily, Eileen Stanford, PA-C, 5 mg at 09/15/16 1016 .  MEDLINE mouth rinse, 15 mL, Mouth Rinse, q12n4p, Rush Farmer, MD, 15 mL at 09/13/16 1200 .  multivitamin with minerals tablet 1 tablet, 1 tablet, Oral, Daily, Albertine Patricia, MD, 1 tablet at 09/15/16 1016 .  ondansetron (ZOFRAN) injection 4 mg, 4 mg, Intravenous, Q6H PRN, Peter M Martinique, MD .  sodium chloride flush (NS) 0.9 %  injection 10-40 mL, 10-40 mL, Intracatheter, PRN, Rush Farmer, MD .  sodium chloride flush (NS) 0.9 % injection 3 mL, 3 mL, Intravenous, Q12H, Peter M Martinique, MD, 3 mL at 09/14/16 2145 .  sodium chloride flush (NS) 0.9 % injection 3 mL, 3 mL, Intravenous, PRN, Peter M Martinique, MD  Patients Current Diet: Diet Heart Room service appropriate? Yes; Fluid consistency: Thin  Precautions / Restrictions Precautions Precautions: Fall Restrictions Weight Bearing Restrictions: No   Has the patient had 2 or more falls or a fall with injury in the past year?No  Prior Activity Level Community (5-7x/wk): Went out daily.  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  Home Assistive Devices / Equipment Home Assistive Devices/Equipment: CPAP, CBG Meter Home Equipment: None  Prior Device Use: Indicate devices/aids used by the patient prior to current illness, exacerbation or injury? 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  Independent  Indoor Mobility: Did the patient need assistance with walking from room to room (with or without device)? Independent  Stairs: Did the patient need assistance with internal or external stairs (with or without device)? Independent  Functional Cognition: Did the patient need help planning regular tasks such as shopping or remembering to take medications? Independent  Current Functional Level Cognition  Overall Cognitive Status: Impaired/Different from baseline Current Attention Level: Sustained Orientation Level: Oriented to person Following Commands: Follows one step commands consistently Safety/Judgement: Decreased awareness of safety General Comments: Asked him if he had family here in [name] and he said yes his mom and dad so I asked  him if they were in their 95's and he said no they were deceased. Asked him how many brother and sisters he had and it too him increased time to tell me the number, when asked where he fell in the mix of 10 children, he tried to tell me but could not come up with the answer. He did now it was Dec and 2017.    Extremity Assessment (includes Sensation/Coordination)  Upper Extremity Assessment: Generalized weakness  Lower Extremity Assessment: Defer to PT evaluation    ADLs  Overall ADL's : Needs assistance/impaired Eating/Feeding: Independent Grooming: Wash/dry hands, Min guard, Standing, Wash/dry face Upper Body Bathing: Min guard, Sitting (simulated) Upper Body Bathing Details (indicate cue type and reason): for thoroughness  Lower Body Bathing: Moderate assistance, Sit to/from stand, Minimal assistance, Sitting/lateral leans (simulated) Upper Body Dressing : Min guard, Standing Lower Body Dressing: Min guard Lower Body Dressing Details (indicate cue type and reason): donned socks sitting EOB, required increased time and effort Toilet Transfer: Min guard, Ambulation, RW, Grab bars, Cueing for safety Toilet Transfer Details (indicate cue type and reason): recliner>out door and back into room>bed Toileting- Water quality scientist and Hygiene: Sit to/from stand, Minimal assistance Toileting - Clothing Manipulation Details (indicate cue type and reason): pt incontinent of stool.  Assisted with peri care  Tub/ Shower Transfer: Min guard, Grab bars, Ambulation, Rolling walker Functional mobility during ADLs: Rolling walker, Min guard, Cueing for safety    Mobility  Overal bed mobility: Independent Bed Mobility: Supine to Sit, Sit to Supine Supine to sit: Min guard, HOB elevated Sit to supine: Min guard General bed mobility comments: pt sitting OOB in recliner when PT entered room and sitting EOB at end of session    Transfers  Overall transfer level: Needs assistance Equipment used: Rolling  walker (2 wheeled) Transfers: Sit to/from Stand Sit to Stand: Min guard Stand pivot transfers: Min assist General transfer comment: VCs for safe hand placement and positioning prior to elevation to upright, min guard for safety    Ambulation / Gait / Stairs / Wheelchair Mobility  Ambulation/Gait Ambulation/Gait assistance: Physicist, medical (Feet): 200 Feet Assistive device: Rolling walker (2 wheeled) Gait Pattern/deviations: Step-through pattern, Decreased stride length, Drifts right/left General Gait Details: pt with min instability during ambulation, no physical assistance needed; however, pt frequently drifting R/L and grazing objects with his RW Gait velocity: decreased Gait velocity interpretation: Below normal speed for age/gender    Posture / Balance Dynamic Sitting Balance Sitting balance - Comments: Able to sit EOB and perform dynmaic LE ROM without difficulty Balance Overall balance assessment:  Needs assistance Sitting-balance support: Feet supported, No upper extremity supported Sitting balance-Leahy Scale: Good Sitting balance - Comments: Able to sit EOB and perform dynmaic LE ROM without difficulty Standing balance support: Single extremity supported Standing balance-Leahy Scale: Poor Standing balance comment: reliance on RW for UE support in static standing    Special needs/care consideration BiPAP/CPAP Yes CPM No Continuous Drip IV No  Dialysis No       Life Vest: Yes Oxygen No Special Bed No Trach Size NO Wound Vac (area) No      Skin Bruise on right arm                            Bowel mgmt: Last BM 09/14/16 Bladder mgmt: Incontinent at times Diabetic mgmt Yes on oral medication, Metformin, at home    Previous Home Environment Living Arrangements: Spouse/significant other, Children Available Help at Discharge: Family Home Layout: One level Home Access: Level entry Home Care Services: No Additional Comments: Pt unable to provide accurate  history.   Pt states he has 5 children, but he is unsure if any live at home, or live close by   Discharge Living Setting Plans for Discharge Living Setting: Patient's home, House, Lives with (comment) (Lives with wife, dtr, and son.) Type of Home at Discharge: House Discharge Home Layout: Two level, Bed/bath upstairs, Able to live on main level with bedroom/bathroom Alternate Level Stairs-Number of Steps: 13 steps Discharge Home Access: Level entry Does the patient have any problems obtaining your medications?: No  Social/Family/Support Systems Patient Roles: Spouse, Parent (Has wife, daughter, son.) Contact Information: Secondary school teacher - spouse Anticipated Caregiver: wife, daughter Anticipated Caregiver's Contact Information: Kathi Ludwig - wife - [phone] Ability/Limitations of Caregiver: Wife can assist.  Sons with Downs syndrome.  Dtr works. Caregiver Availability: 24/7 Discharge Plan Discussed with Primary Caregiver: Yes Is Caregiver In Agreement with Plan?: Yes Does Caregiver/Family have Issues with Lodging/Transportation while Pt is in Rehab?: No  Goals/Additional Needs Patient/Family Goal for Rehab: PT/OT/SLP mod I and supervision goals Expected length of stay: 7-9 days (insurance terms on 09/24/16) Cultural Considerations: Holiness Dietary Needs: Heart diet, thin liquids Equipment Needs: TBD Special Service Needs: Life vest placed prior to admission to inpatient rehab Pt/Family Agrees to Admission and willing to participate: Yes Program Orientation Provided & Reviewed with Pt/Caregiver Including Roles  & Responsibilities: Yes  Decrease burden of Care through IP rehab admission: No  Possible need for SNF placement upon discharge: Not anticipated, expect shorter LOS than listed above.  Patient Condition: This patient's medical and functional status has changed since the consult dated: 09/05/16 in which the Rehabilitation Physician determined and documented that the  patient's condition is appropriate for intensive rehabilitative care in an inpatient rehabilitation facility. See "History of Present Illness" (above) for medical update. Functional changes are:  Currently requiring minguard assist to ambulate 120 feet RW. Patient's medical and functional status update has been discussed with the Rehabilitation physician and patient remains appropriate for inpatient rehabilitation. Will admit to inpatient rehab today.  Preadmission Screen Completed By:  Retta Diones, 09/15/2016 12:49 PM ______________________________________________________________________   Discussed status with Dr. Posey Pronto on 09/15/16 at 1244 and received telephone approval for admission today.  Admission Coordinator:  Retta Diones, time1244/Date12/22/17

## 2016-09-14 NOTE — Progress Notes (Signed)
Lifevest representative and night shift nurse at bedside. Representative went over education with pt and staff. Explained to call the 1-800 number when the family arrives to provide education and when patient discharge to take all belongings including extra battery, Games developer, phone and Pensions consultant.  Dean [name] Leory Plowman

## 2016-09-14 NOTE — Progress Notes (Signed)
Rehab admissions - I have authorization from insurance case manager, but patient does not have his life vest yet.  I will follow up in am for potential admission once patient has his life vest.  Call me for questions.  [phone]

## 2016-09-14 NOTE — Progress Notes (Signed)
Patient will call when ready for CPAP.

## 2016-09-15 ENCOUNTER — Encounter (HOSPITAL_COMMUNITY): Payer: Self-pay | Admitting: Nurse Practitioner

## 2016-09-15 ENCOUNTER — Inpatient Hospital Stay (HOSPITAL_COMMUNITY)
Admission: RE | Admit: 2016-09-15 | Discharge: 2016-09-21 | DRG: 948 | Disposition: A | Discharge status: Home Health | Payer: Medicare Other | Source: Intra-hospital | Attending: Physical Medicine & Rehabilitation | Admitting: Physical Medicine & Rehabilitation

## 2016-09-15 DIAGNOSIS — R7881 Bacteremia: Secondary | ICD-10-CM

## 2016-09-15 DIAGNOSIS — N179 Acute kidney failure, unspecified: Secondary | ICD-10-CM | POA: Diagnosis not present

## 2016-09-15 DIAGNOSIS — R740 Nonspecific elevation of levels of transaminase and lactic acid dehydrogenase [LDH]: Secondary | ICD-10-CM

## 2016-09-15 DIAGNOSIS — D62 Acute posthemorrhagic anemia: Secondary | ICD-10-CM | POA: Diagnosis present

## 2016-09-15 DIAGNOSIS — G3184 Mild cognitive impairment, so stated: Secondary | ICD-10-CM | POA: Diagnosis present

## 2016-09-15 DIAGNOSIS — G931 Anoxic brain damage, not elsewhere classified: Secondary | ICD-10-CM | POA: Diagnosis present

## 2016-09-15 DIAGNOSIS — Z6841 Body Mass Index (BMI) 40.0 and over, adult: Secondary | ICD-10-CM | POA: Diagnosis not present

## 2016-09-15 DIAGNOSIS — E1169 Type 2 diabetes mellitus with other specified complication: Secondary | ICD-10-CM | POA: Diagnosis not present

## 2016-09-15 DIAGNOSIS — E876 Hypokalemia: Secondary | ICD-10-CM | POA: Diagnosis present

## 2016-09-15 DIAGNOSIS — Z8674 Personal history of sudden cardiac arrest: Secondary | ICD-10-CM

## 2016-09-15 DIAGNOSIS — G4733 Obstructive sleep apnea (adult) (pediatric): Secondary | ICD-10-CM

## 2016-09-15 DIAGNOSIS — I10 Essential (primary) hypertension: Secondary | ICD-10-CM

## 2016-09-15 DIAGNOSIS — I472 Ventricular tachycardia: Secondary | ICD-10-CM | POA: Diagnosis present

## 2016-09-15 DIAGNOSIS — R531 Weakness: Principal | ICD-10-CM | POA: Diagnosis present

## 2016-09-15 DIAGNOSIS — E119 Type 2 diabetes mellitus without complications: Secondary | ICD-10-CM | POA: Diagnosis present

## 2016-09-15 DIAGNOSIS — J45909 Unspecified asthma, uncomplicated: Secondary | ICD-10-CM | POA: Diagnosis present

## 2016-09-15 DIAGNOSIS — F419 Anxiety disorder, unspecified: Secondary | ICD-10-CM | POA: Diagnosis present

## 2016-09-15 DIAGNOSIS — I259 Chronic ischemic heart disease, unspecified: Secondary | ICD-10-CM | POA: Diagnosis present

## 2016-09-15 DIAGNOSIS — I130 Hypertensive heart and chronic kidney disease with heart failure and stage 1 through stage 4 chronic kidney disease, or unspecified chronic kidney disease: Secondary | ICD-10-CM | POA: Diagnosis present

## 2016-09-15 DIAGNOSIS — N183 Chronic kidney disease, stage 3 unspecified: Secondary | ICD-10-CM

## 2016-09-15 DIAGNOSIS — R410 Disorientation, unspecified: Secondary | ICD-10-CM

## 2016-09-15 DIAGNOSIS — E1122 Type 2 diabetes mellitus with diabetic chronic kidney disease: Secondary | ICD-10-CM | POA: Diagnosis present

## 2016-09-15 DIAGNOSIS — R7401 Elevation of levels of liver transaminase levels: Secondary | ICD-10-CM

## 2016-09-15 DIAGNOSIS — M109 Gout, unspecified: Secondary | ICD-10-CM | POA: Diagnosis present

## 2016-09-15 DIAGNOSIS — D72829 Elevated white blood cell count, unspecified: Secondary | ICD-10-CM | POA: Diagnosis not present

## 2016-09-15 DIAGNOSIS — E7800 Pure hypercholesterolemia, unspecified: Secondary | ICD-10-CM | POA: Diagnosis present

## 2016-09-15 DIAGNOSIS — I5020 Unspecified systolic (congestive) heart failure: Secondary | ICD-10-CM | POA: Diagnosis present

## 2016-09-15 DIAGNOSIS — N1832 Chronic kidney disease, stage 3b: Secondary | ICD-10-CM

## 2016-09-15 DIAGNOSIS — I110 Hypertensive heart disease with heart failure: Secondary | ICD-10-CM | POA: Diagnosis present

## 2016-09-15 DIAGNOSIS — N390 Urinary tract infection, site not specified: Secondary | ICD-10-CM

## 2016-09-15 DIAGNOSIS — Z95810 Presence of automatic (implantable) cardiac defibrillator: Secondary | ICD-10-CM

## 2016-09-15 DIAGNOSIS — I5021 Acute systolic (congestive) heart failure: Secondary | ICD-10-CM | POA: Diagnosis not present

## 2016-09-15 LAB — GLUCOSE, CAPILLARY
Glucose-Capillary: 105 mg/dL — ABNORMAL HIGH (ref 65–99)
Glucose-Capillary: 126 mg/dL — ABNORMAL HIGH (ref 65–99)
Glucose-Capillary: 117 mg/dL — ABNORMAL HIGH (ref 65–99)

## 2016-09-15 MED ORDER — ALUM & MAG HYDROXIDE-SIMETH 200-200-20 MG/5ML PO SUSP: 30.0000 mL | ORAL | Status: DC | PRN

## 2016-09-15 MED ORDER — ATORVASTATIN CALCIUM 40 MG PO TABS: 40.0000 mg | ORAL_TABLET | Freq: Every day | ORAL | Status: DC

## 2016-09-15 MED ORDER — SULFAMETHOXAZOLE-TRIMETHOPRIM 800-160 MG PO TABS: 1.0000 | ORAL_TABLET | Freq: Two times a day (BID) | ORAL | Status: DC

## 2016-09-15 MED ORDER — ALLOPURINOL 100 MG PO TABS
300.0000 mg | ORAL_TABLET | Freq: Every day | ORAL | Status: DC
  Administered 2016-09-15 – 2016-09-21 (×7): 300 mg via ORAL
  Filled 2016-09-15 (×3): qty 3
  Filled 2016-09-15: qty 1
  Filled 2016-09-15: qty 3
  Filled 2016-09-15: qty 1
  Filled 2016-09-15 (×2): qty 3

## 2016-09-15 MED ORDER — POTASSIUM CHLORIDE CRYS ER 20 MEQ PO TBCR
20.0000 meq | EXTENDED_RELEASE_TABLET | Freq: Two times a day (BID) | ORAL | Status: DC
  Administered 2016-09-15 – 2016-09-21 (×12): 20 meq via ORAL
  Filled 2016-09-15 (×12): qty 1

## 2016-09-15 MED ORDER — INSULIN ASPART 100 UNIT/ML ~~LOC~~ SOLN
0.0000 [IU] | Freq: Three times a day (TID) | SUBCUTANEOUS | Status: DC
Start: 2016-09-15 — End: 2016-09-21

## 2016-09-15 MED ORDER — GLUCERNA SHAKE PO LIQD: 237.0000 mL | ORAL | 0 refills | Status: DC

## 2016-09-15 MED ORDER — ENOXAPARIN SODIUM 40 MG/0.4ML ~~LOC~~ SOLN: 40.0000 mg | SUBCUTANEOUS | Status: DC

## 2016-09-15 MED ORDER — AMIODARONE HCL 200 MG PO TABS: 200.0000 mg | ORAL_TABLET | Freq: Every day | ORAL | Status: DC

## 2016-09-15 MED ORDER — FUROSEMIDE 20 MG PO TABS: 20.0000 mg | ORAL_TABLET | Freq: Every day | ORAL | Status: DC

## 2016-09-15 MED ORDER — PROCHLORPERAZINE EDISYLATE 5 MG/ML IJ SOLN: 5.0000 mg | Freq: Four times a day (QID) | INTRAMUSCULAR | Status: DC | PRN

## 2016-09-15 MED ORDER — CARVEDILOL 12.5 MG PO TABS: 12.5000 mg | ORAL_TABLET | Freq: Two times a day (BID) | ORAL | Status: DC

## 2016-09-15 MED ORDER — HEPARIN SODIUM (PORCINE) 5000 UNIT/ML IJ SOLN
5000.0000 [IU] | Freq: Three times a day (TID) | INTRAMUSCULAR | Status: DC
  Administered 2016-09-15 – 2016-09-21 (×17): 5000 [IU] via SUBCUTANEOUS
  Filled 2016-09-15 (×17): qty 1

## 2016-09-15 MED ORDER — ADULT MULTIVITAMIN W/MINERALS CH: 1.0000 | ORAL_TABLET | Freq: Every day | ORAL | Status: DC

## 2016-09-15 MED ORDER — FAMOTIDINE 20 MG PO TABS: 20.0000 mg | ORAL_TABLET | Freq: Two times a day (BID) | ORAL | Status: DC

## 2016-09-15 MED ORDER — POLYETHYLENE GLYCOL 3350 17 G PO PACK: 17.0000 g | PACK | Freq: Every day | ORAL | Status: DC | PRN

## 2016-09-15 MED ORDER — ASPIRIN 81 MG PO TBEC: 81.0000 mg | DELAYED_RELEASE_TABLET | Freq: Every day | ORAL | Status: DC

## 2016-09-15 MED ORDER — GUAIFENESIN-DM 100-10 MG/5ML PO SYRP
5.0000 mL | ORAL_SOLUTION | Freq: Four times a day (QID) | ORAL | Status: DC | PRN
  Administered 2016-09-18 – 2016-09-19 (×2): 10 mL via ORAL
  Filled 2016-09-15 (×2): qty 10

## 2016-09-15 MED ORDER — PROCHLORPERAZINE 25 MG RE SUPP: 12.5000 mg | Freq: Four times a day (QID) | RECTAL | Status: DC | PRN

## 2016-09-15 MED ORDER — AMIODARONE HCL 400 MG PO TABS: 400.0000 mg | ORAL_TABLET | Freq: Two times a day (BID) | ORAL | Status: DC

## 2016-09-15 MED ORDER — FUROSEMIDE 20 MG PO TABS
20.0000 mg | ORAL_TABLET | Freq: Every day | ORAL | Status: DC
  Administered 2016-09-16 – 2016-09-21 (×6): 20 mg via ORAL
  Filled 2016-09-15 (×6): qty 1

## 2016-09-15 MED ORDER — SODIUM CHLORIDE 0.9% FLUSH
10.0000 mL | INTRAVENOUS | Status: DC | PRN
Start: 2016-09-15 — End: 2016-09-21

## 2016-09-15 MED ORDER — DOCUSATE SODIUM 50 MG/5ML PO LIQD
100.0000 mg | Freq: Every day | ORAL | Status: DC
  Administered 2016-09-16 – 2016-09-17 (×2): 100 mg via ORAL
  Filled 2016-09-15 (×2): qty 10

## 2016-09-15 MED ORDER — IPRATROPIUM-ALBUTEROL 20-100 MCG/ACT IN AERS: 1.0000 | INHALATION_SPRAY | Freq: Four times a day (QID) | RESPIRATORY_TRACT | Status: DC | PRN

## 2016-09-15 MED ORDER — BISACODYL 10 MG RE SUPP: 10.0000 mg | Freq: Every day | RECTAL | Status: DC | PRN

## 2016-09-15 MED ORDER — ACETAMINOPHEN 325 MG PO TABS: 325.0000 mg | ORAL_TABLET | ORAL | Status: DC | PRN

## 2016-09-15 MED ORDER — ATORVASTATIN CALCIUM 40 MG PO TABS
40.0000 mg | ORAL_TABLET | Freq: Every day | ORAL | Status: DC
  Administered 2016-09-15 – 2016-09-20 (×6): 40 mg via ORAL
  Filled 2016-09-15 (×6): qty 1

## 2016-09-15 MED ORDER — ADULT MULTIVITAMIN W/MINERALS CH
1.0000 | ORAL_TABLET | Freq: Every day | ORAL | Status: DC
  Administered 2016-09-16 – 2016-09-21 (×6): 1 via ORAL
  Filled 2016-09-15 (×6): qty 1

## 2016-09-15 MED ORDER — FAMOTIDINE 20 MG PO TABS
20.0000 mg | ORAL_TABLET | Freq: Two times a day (BID) | ORAL | Status: DC
  Administered 2016-09-15 – 2016-09-21 (×12): 20 mg via ORAL
  Filled 2016-09-15 (×12): qty 1

## 2016-09-15 MED ORDER — TRAZODONE HCL 50 MG PO TABS
25.0000 mg | ORAL_TABLET | Freq: Every evening | ORAL | Status: DC | PRN
  Administered 2016-09-17: 50 mg via ORAL
  Filled 2016-09-15: qty 1

## 2016-09-15 MED ORDER — MAGNESIUM OXIDE 400 (241.3 MG) MG PO TABS
200.0000 mg | ORAL_TABLET | Freq: Two times a day (BID) | ORAL | Status: DC
  Administered 2016-09-15 – 2016-09-21 (×12): 200 mg via ORAL
  Filled 2016-09-15 (×12): qty 1

## 2016-09-15 MED ORDER — INSULIN ASPART 100 UNIT/ML ~~LOC~~ SOLN: 0.0000 [IU] | Freq: Three times a day (TID) | SUBCUTANEOUS | 11 refills | Status: DC

## 2016-09-15 MED ORDER — PROCHLORPERAZINE MALEATE 5 MG PO TABS: 5.0000 mg | ORAL_TABLET | Freq: Four times a day (QID) | ORAL | Status: DC | PRN

## 2016-09-15 MED ORDER — CARVEDILOL 12.5 MG PO TABS
12.5000 mg | ORAL_TABLET | Freq: Two times a day (BID) | ORAL | Status: DC
  Administered 2016-09-16 – 2016-09-21 (×11): 12.5 mg via ORAL
  Filled 2016-09-15 (×11): qty 1

## 2016-09-15 MED ORDER — FLEET ENEMA 7-19 GM/118ML RE ENEM: 1.0000 | ENEMA | Freq: Once | RECTAL | Status: DC | PRN

## 2016-09-15 MED ORDER — DEXTROSE 5 % IV SOLN
2.0000 g | INTRAVENOUS | Status: DC
  Administered 2016-09-16 – 2016-09-19 (×4): 2 g via INTRAVENOUS
  Filled 2016-09-15 (×6): qty 2

## 2016-09-15 MED ORDER — DIPHENHYDRAMINE HCL 12.5 MG/5ML PO ELIX: 12.5000 mg | ORAL_SOLUTION | Freq: Four times a day (QID) | ORAL | Status: DC | PRN

## 2016-09-15 MED ORDER — ASPIRIN EC 81 MG PO TBEC
81.0000 mg | DELAYED_RELEASE_TABLET | Freq: Every day | ORAL | Status: DC
  Administered 2016-09-16 – 2016-09-21 (×6): 81 mg via ORAL
  Filled 2016-09-15 (×6): qty 1

## 2016-09-15 MED ORDER — DOCUSATE SODIUM 50 MG/5ML PO LIQD: 100.0000 mg | Freq: Every day | ORAL | 0 refills | Status: DC

## 2016-09-15 MED ORDER — INSULIN ASPART 100 UNIT/ML ~~LOC~~ SOLN: 0.0000 [IU] | Freq: Every day | SUBCUTANEOUS | Status: DC

## 2016-09-15 MED ORDER — LISINOPRIL 5 MG PO TABS
5.0000 mg | ORAL_TABLET | Freq: Every day | ORAL | Status: DC
  Administered 2016-09-16 – 2016-09-17 (×2): 5 mg via ORAL
  Filled 2016-09-15 (×2): qty 1

## 2016-09-15 MED ORDER — METFORMIN HCL 500 MG PO TABS
500.0000 mg | ORAL_TABLET | Freq: Every day | ORAL | Status: DC
  Administered 2016-09-16 – 2016-09-21 (×6): 500 mg via ORAL
  Filled 2016-09-15 (×6): qty 1

## 2016-09-15 MED ORDER — AMIODARONE HCL 200 MG PO TABS
400.0000 mg | ORAL_TABLET | Freq: Two times a day (BID) | ORAL | Status: DC
  Administered 2016-09-15 – 2016-09-19 (×8): 400 mg via ORAL
  Filled 2016-09-15 (×8): qty 2

## 2016-09-15 MED ORDER — LISINOPRIL 5 MG PO TABS: 5.0000 mg | ORAL_TABLET | Freq: Every day | ORAL | Status: DC

## 2016-09-15 NOTE — Progress Notes (Signed)
Patient ID: Dean [name], male   DOB: May 07, 1951, 65 y.o.   MRN: [id number] Patient admitted to 4M01 via bed, escorted by nursing staff.  Patient verbalized understanding of rehab process, signed fall safety agreement.  Patient appears to be in no immediate distress at this time.  Life Vest is on and working properly, extra Museum/gallery conservator.  Brita Romp, RN

## 2016-09-15 NOTE — Progress Notes (Signed)
Pt is alert and oriented with delayed response, report at bedside, External def in place and teach-back with demonstration by patient. In bed

## 2016-09-15 NOTE — Discharge Summary (Signed)
Uwais Bommer, is a 65 y.o. male  DOB Feb 21, 1951  MRN [id number].  Admission date:  08/27/2016  Admitting Physician  Rush Farmer, MD  Discharge Date:  09/15/2016   Primary MD  [name], MD  Recommendations for primary care physician for things to follow:  - Please monitor to try closely, keep potassium more than 4, magnesium more than 2 - Patient to continue wearing his LifeVest until seen by EP regarding decision for AICD - Please follow the final results of blood cultures obtained on 12/19 - Continue C Pap at that time  Admission Diagnosis  Cardiac arrest ([place]) [I46.9] Altered mental status [R41.82]   Discharge Diagnosis  Cardiac arrest ([place]) [I46.9] Altered mental status [R41.82]    Principal Problem:   Cardiopulmonary arrest ([place]) Active Problems:   DM2 (diabetes mellitus, type 2) ([place])   Morbid obesity ([place])   Essential hypertension, benign   Acute encephalopathy   Acute respiratory failure ([place])   Uncomplicated asthma   Diabetes mellitus type 2 in obese ([place])   Acute on chronic combined systolic and diastolic CHF (congestive heart failure) (H[facility])   Tachypnea   Hypernatremia   Hypokalemia   Leukocytosis   Acute blood loss anemia   Hypoxemia   NSVT (nonsustained ventricular tachycardia) (H[facility])   Chronic respiratory failure with hypoxia (H[facility])   Fever   Acute cystitis without hematuria   Bacteremia due to Gram-negative bacteria      Past Medical History:  Diagnosis Date  . Arthritis    "maybe in his spine" (09/05/2016)  . Asthma   . Cardiac arrest ([place]) 08/27/2016   REQUIRING CPR, SHOCK & MEDICATIONS  . Gout   . High cholesterol   . Hypertension   . OSA on CPAP   . Type II diabetes mellitus ([place])     Past Surgical History:  Procedure Laterality Date  . CARDIAC CATHETERIZATION N/A 09/07/2016   Procedure: Left Heart Cath and Coronary Angiography;  Surgeon: Peter M  Martinique, MD;  Location: [facility];  Service: Cardiovascular;  Laterality: N/A;  . NO PAST SURGERIES         History of present illness and  Hospital Course:     Kindly see H&P for history of present illness and admission details, please review complete Labs, Consult reports and Test reports for all details in brief  HPI  from the history and physical done on the day of admission 08/27/2016  65 yo MO (310 lbs 5'5") AAM who was walking back from the bathroom 12/3 when he collapsed pulseless. CPR wa not performed by family and EMS arrived in 10 minutes and performed CPR x 21 minutes, 7 rounds of epi, 5 shocks, amiodarone and magnesium. He coded again with ROSC. King airway was changed by EDP. He was taken to CT scanner with NAD of brain, transfer to CCU and cardiology was called. Hypothermia protocol was initiated at 1545 after ct head. His prognosis for meaningful recover is poor.  Hospital Course   65 yo MO (310 lbs  5'5") AAM who was walking back from the bathroom 12/3 when he collapsed pulseless. CPR wa not performed by family and EMS arrived in 10 minutes and performed CPR x 21 minutes, 7 rounds of epi, 5 shocks, amiodarone and magnesium. He coded again with ROSC. King airway was changed by EDP. He was taken to CT scanner with NAD of brain, transfer to CCU and cardiology was called. Hypothermia protocol was initiated at 1545 after ct head. After clinical improvement and stabilization, care transferred from [facility] to [facility] on 09/06/16. Clinically improved >CHF compensated, hypoxia resolved, mental status steadily improving. Status post cath. EP recommends ICD , plan currently of insertion on hold given bacteremia ,  placed on  LifeVest death prevention  SIGNIFICANT EVENTS: 12/3 Cardiac arrest  12/5 >Re-Warmed, off sedation  12/7 extubated   Status post cardiac arrest 08/27/16 with 31 minutes down:  - VT/VF arrest,? Secondary to respiratory process. Cardiology following. Underwent cardiac  cath 12/14 and detailed results as below. 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 Medical management recommended by cardiology.  -EP follow-up appreciated , ICD implant for 12/18 as been canceled given Citrobacter bacteremia,   and plans for AICD insertion is on hold, patient was fitted for LifeVest this prevention , and started. To discharge , to follow with EP as an outpatient regarding AICD .   NSVT: - EP consultation appreciated. On coreg, Amiodarone(decrease amiodarone to 200 mg daily in 12/27 ) . AICD when able.currently on LifeVest death prevention.  -  Attempt to keep K>4, Mg > 2.  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 -  treated with IV Zosyn, pansensitive, was transitioned to Rocephin, continue total of 14 days of antibiotics, will be discharged on oral Bactrim till 12/30 , discussed with ID. - Surveillance cultures remains negative. Please follow on final results  Acute respiratory failure with hypoxia:  - Extubated 12/7. Hypoxia resolved.  Acute kidney injury:  - Resolved.  Hypokalemia:  - Attempt to keep potassium >4 and magnesium >2   Dysphagia:  - Followed by his P, currently on regular solids with thin liquid  DM type II:  - Continue SSI. Good inpatient control.  Essential hypertension: -  controlled. Continue with Coreg and lisinopril  Acute encephalopathy: -  Agitation/delirium versus anoxic injury. CT head negative. Avoid sedative and QT prolonging medications. Precedex discontinued 09/05/16. Improving. As per daughter at bedside on 12/18, mental status has improved and almost close to baseline.  History of asthma:  - Stable. Status post  extubation 08/31/16. As per family at bedside on 12/14, mental status gradually improving.  Prolonged QTC:  - 508 ms on 09/02/16 and down to 473 ms on 09/04/16.  ? OSA:  - Has been on CPAP at bedtime.  Discharge Condition:  stable   Follow UP  Follow-up Information    Will Meredith Leeds, MD Follow up on 10/05/2016.   Specialty:  Cardiology Why:  10:30AM Contact information: [place] [address] [phone]             Discharge Instructions  and  Discharge Medications     Discharge Instructions    Discharge instructions    Complete by:  As directed    Follow with Primary MD  [name], MD after discharge  Get CBC, CMP,  checked  by Primary MD next visit.    Activity: As tolerated with Full fall precautions use walker/cane & assistance as needed   Disposition CIR   Diet: Heart Healthy  , with feeding assistance and aspiration precautions.  For Heart failure patients - Check your Weight same time everyday, if you gain over 2 pounds, or you develop in leg swelling, experience more shortness of breath or chest pain, call your Primary MD immediately. Follow Cardiac Low Salt Diet and 1.5 lit/day fluid restriction.   On your next visit with your primary care physician please Get Medicines reviewed and adjusted.   Please request your Prim.MD to go over all Hospital Tests and Procedure/Radiological results at the follow up, please get all Hospital records sent to your Prim MD by signing hospital release before you go home.   If you experience worsening of your admission symptoms, develop shortness of breath, life threatening emergency, suicidal or homicidal thoughts you must seek medical attention immediately by calling 911 or calling your MD immediately  if symptoms less severe.  You Must read complete instructions/literature along with all the possible adverse reactions/side effects for all the Medicines you take and that have been  prescribed to you. Take any new Medicines after you have completely understood and accpet all the possible adverse reactions/side effects.   Do not drive, operating heavy machinery, perform activities at heights, swimming or participation in water activities or provide baby sitting services if your were admitted for syncope or siezures until you have seen by Primary MD or a Neurologist and advised to do so again.  Do not drive when taking Pain medications.    Do not take more than prescribed Pain, Sleep and Anxiety Medications  Special Instructions: If you have smoked or chewed Tobacco  in the last 2 yrs please stop smoking, stop any regular Alcohol  and or any Recreational drug use.  Wear Seat belts while driving.   Please note  You were cared for by a hospitalist during your hospital stay. If you have any questions about your discharge medications or the care you received while you were in the hospital after you are discharged, you can call the unit and asked to speak with the hospitalist on call if the hospitalist that took care of you is not available. Once you are discharged, your primary care physician will handle any further medical issues. Please note that NO REFILLS for any discharge medications will be authorized once you are discharged, as it is imperative that you return to your primary care physician (or establish a relationship with a primary care physician if you do not have one) for your aftercare needs so that they can reassess your need for medications and monitor your lab values.   Increase activity slowly    Complete by:  As directed      Allergies as of 09/15/2016   No Known Allergies     Medication List    STOP taking these medications   amLODipine 10 MG tablet Commonly known as:  NORVASC   cloNIDine 0.2 MG tablet Commonly known as:  CATAPRES   COMBIVENT RESPIMAT 20-100 MCG/ACT Aers respimat Generic drug:  Ipratropium-Albuterol   isosorbide-hydrALAZINE  20-37.5 MG tablet Commonly known as:  BIDIL   metFORMIN 500 MG tablet Commonly known as:  GLUCOPHAGE   metoprolol 200 MG 24 hr tablet Commonly known as:  TOPROL-XL   PRESCRIPTION MEDICATION   rosuvastatin 20 MG tablet  Commonly known as:  CRESTOR   spironolactone 50 MG tablet Commonly known as:  ALDACTONE     TAKE these medications   allopurinol 300 MG tablet Commonly known as:  ZYLOPRIM Take 300 mg by mouth daily.   amiodarone 400 MG tablet Commonly known as:  PACERONE Take 1 tablet (400 mg total) by mouth 2 (two) times daily. Please continue 400 mg twice a day until 12/27, then transition to amiodarone 200 mg oral daily   aspirin 81 MG EC tablet Take 1 tablet (81 mg total) by mouth daily. Start taking on:  09/16/2016   atorvastatin 40 MG tablet Commonly known as:  LIPITOR Take 1 tablet (40 mg total) by mouth daily at 6 PM.   carvedilol 12.5 MG tablet Commonly known as:  COREG Take 1 tablet (12.5 mg total) by mouth 2 (two) times daily with a meal.   [employer] VITAMIN D3 1000 units capsule Generic drug:  Cholecalciferol Take 1,000 Units by mouth daily.   docusate 50 MG/5ML liquid Commonly known as:  COLACE Take 10 mLs (100 mg total) by mouth daily. Start taking on:  09/16/2016   famotidine 20 MG tablet Commonly known as:  PEPCID Take 1 tablet (20 mg total) by mouth 2 (two) times daily.   feeding supplement (GLUCERNA SHAKE) Liqd Take 237 mLs by mouth daily.   furosemide 20 MG tablet Commonly known as:  LASIX Take 1 tablet (20 mg total) by mouth daily. Start taking on:  09/16/2016 What changed:  medication strength  how much to take   insulin aspart 100 UNIT/ML injection Commonly known as:  novoLOG Inject 0-9 Units into the skin 3 (three) times daily with meals.   lisinopril 5 MG tablet Commonly known as:  PRINIVIL,ZESTRIL Take 1 tablet (5 mg total) by mouth daily. Start taking on:  09/16/2016   multivitamin with minerals Tabs tablet Take 1 tablet by  mouth daily. Start taking on:  09/16/2016   sulfamethoxazole-trimethoprim 800-160 MG tablet Commonly known as:  BACTRIM DS,SEPTRA DS Take 1 tablet by mouth 2 (two) times daily. Continue till 12/30 then stop Start taking on:  09/23/2016         Diet and Activity recommendation: See Discharge Instructions above   Consults obtained -  CCM-signed off  Cardiology/EP   Major procedures and Radiology Reports - PLEASE review detailed and final reports for all details, in brief -      Ct Head Wo Contrast  Result Date: 08/27/2016 CLINICAL DATA:  POST CPRwitnessed cardiac arrest. After ambulating to the bathroom, pt returned and suddenly collapsed. Pt's family was unable to perform CPR, but EMS arrived and performed CPR from 11:19 AM to 11:42 AM. EXAM: CT HEAD WITHOUT CONTRAST TECHNIQUE: Contiguous axial images were obtained from the base of the skull through the vertex without intravenous contrast. COMPARISON:  03/29/2005 FINDINGS: Brain: No evidence of acute infarction, hemorrhage, hydrocephalus, extra-axial collection or mass lesion/mass effect. The ventricles are normal in configuration. 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 No hydrocephalus. There is mild patchy white matter hypoattenuation consistent with chronic microvascular ischemic change. Vascular: No hyperdense vessel or unexpected calcification. Skull: Normal. Negative for fracture or focal lesion. Sinuses/Orbits: Normal globes and orbits. Visualized sinuses and mastoid air cells are clear. Other: None. IMPRESSION: 1. No acute intracranial abnormalities. 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 Electronically Signed   By: Lajean Manes M.D.   On: 08/27/2016 16:15   Ct Angio Chest Pe W And/or Wo  Contrast  Addendum Date: 08/27/2016   ADDENDUM REPORT: 08/27/2016 19:56 ADDENDUM: Endotracheal tube terminates in the right mainstem bronchus, as dictated in the  original report. Retracting approximately 3-4 cm should better position the tip above the carina. Critical Value/emergent results were called by telephone at the time of interpretation on 08/27/2016 at 7:55 pm to [facility], who verbally acknowledged these results. Electronically Signed   By: Lorin Picket M.D.   On: 08/27/2016 19:56   Result Date: 08/27/2016 CLINICAL DATA:  Cardiac arrest. EXAM: CT ANGIOGRAPHY CHEST CT ABDOMEN AND PELVIS WITH CONTRAST TECHNIQUE: Multidetector CT imaging of the chest was performed using the standard protocol during bolus administration of intravenous contrast. Multiplanar CT image reconstructions and MIPs were obtained to evaluate the vascular anatomy. Multidetector CT imaging of the abdomen and pelvis was performed using the standard protocol during bolus administration of intravenous contrast. CONTRAST:  100 cc Isovue 370. COMPARISON:  Report of MR abdomen 03/03/2003 and CT abdomen 03/02/2003 are reviewed. Images are not available. FINDINGS: CTA CHEST FINDINGS Cardiovascular: Image quality is degraded by respiratory motion. No large central pulmonary embolus. Lobar, segmental and subsegmental pulmonary arteries cannot be evaluate. Atherosclerotic calcification of the arterial vasculature, including coronary arteries. Pulmonary arteries and heart are enlarged. No pericardial effusion. Mediastinum/Nodes: Mediastinal lymph nodes measure up to 11 mm in the low right paratracheal and prevascular regions. No definite hilar lymph nodes. Axillary lymph nodes are incompletely imaged and measure up to highly 13 mm. Nasogastric tube is seen within the esophagus, terminating in the stomach. Lungs/Pleura: Image quality is degraded by respiratory motion. Endotracheal tube terminates in the right mainstem bronchus. Basilar dependent patchy airspace consolidation. No definite pleural fluid. Musculoskeletal: No worrisome lytic or sclerotic lesions. Degenerative changes are seen in the spine.  Flowing anterior osteophytosis in the thoracic spine. Review of the MIP images confirms the above findings. CT ABDOMEN and PELVIS FINDINGS Hepatobiliary: A 4.9 x 7.2 cm low-attenuation lesion in the dome of the liver appears to have contrast puddling but is not imaged on the delayed nephrographic phase series. Mildly heterogeneous low-attenuation lesion in the left hepatic lobe measures 5.1 x 5.5 cm. Multiple lesions characterized hemangiomas were reported on examination of 03/03/2003. Images are not available at the time dictation. Today's measurements are larger than on that report. Additional smaller lesions are seen in the caudate and inferior right hepatic lobe. Gallbladder is unremarkable. No biliary ductal dilatation. Pancreas: Negative. Spleen: Negative. Adrenals/Urinary Tract: Adrenal glands are unremarkable. Sub cm low-attenuation lesion in the right kidney is too small to characterize. Ureters are decompressed. Foley catheter and air are seen in a decompressed bladder. Bladder wall appears thickened. Stomach/Bowel: Nasogastric terminates in the stomach. Stomach, small bowel, appendix and colon are unremarkable. Vascular/Lymphatic: Atherosclerotic calcification of the arterial vasculature without abdominal aortic aneurysm. Portacaval lymph node measures 1.5 cm. Reproductive: Prostate is visualized and contains calcifications. Other: No free fluid. Tiny periumbilical hernia contains fat. Mesenteries and peritoneum are unremarkable. Musculoskeletal: No worrisome lytic or sclerotic lesions. Degenerative changes are seen in the spine and hips. IMPRESSION: 1. Poor assessment of the lobar, segmental and subsegmental pulmonary arteries due to excessive respiratory motion. No definite central pulmonary embolus. 2. Dependent pulmonary parenchymal collapse/consolidation bilaterally. Aspiration and pneumonia are not excluded. 3. Hepatic lesions, previously characterized as hemangiomas on 03/03/2003. Images are not  available for comparison. Some lesions measure larger on today's study. If further assessment is desired, MR abdomen without and with contrast is recommended, when the patient is clinically stable and able to  hold his breath, preferably as an outpatient. 4. Bladder wall appears thickened. 5.  Aortic atherosclerosis (ICD10-170.0). Electronically Signed: By: Lorin Picket M.D. On: 08/27/2016 16:37   Ct Abdomen Pelvis W Contrast  Addendum Date: 08/27/2016   ADDENDUM REPORT: 08/27/2016 19:56 ADDENDUM: Endotracheal tube terminates in the right mainstem bronchus, as dictated in the original report. Retracting approximately 3-4 cm should better position the tip above the carina. Critical Value/emergent results were called by telephone at the time of interpretation on 08/27/2016 at 7:55 pm to [facility], who verbally acknowledged these results. Electronically Signed   By: Lorin Picket M.D.   On: 08/27/2016 19:56   Result Date: 08/27/2016 CLINICAL DATA:  Cardiac arrest. EXAM: CT ANGIOGRAPHY CHEST CT ABDOMEN AND PELVIS WITH CONTRAST TECHNIQUE: Multidetector CT imaging of the chest was performed using the standard protocol during bolus administration of intravenous contrast. Multiplanar CT image reconstructions and MIPs were obtained to evaluate the vascular anatomy. Multidetector CT imaging of the abdomen and pelvis was performed using the standard protocol during bolus administration of intravenous contrast. CONTRAST:  100 cc Isovue 370. COMPARISON:  Report of MR abdomen 03/03/2003 and CT abdomen 03/02/2003 are reviewed. Images are not available. FINDINGS: CTA CHEST FINDINGS Cardiovascular: Image quality is degraded by respiratory motion. No large central pulmonary embolus. Lobar, segmental and subsegmental pulmonary arteries cannot be evaluate. Atherosclerotic calcification of the arterial vasculature, including coronary arteries. Pulmonary arteries and heart are enlarged. No pericardial effusion.  Mediastinum/Nodes: Mediastinal lymph nodes measure up to 11 mm in the low right paratracheal and prevascular regions. No definite hilar lymph nodes. Axillary lymph nodes are incompletely imaged and measure up to highly 13 mm. Nasogastric tube is seen within the esophagus, terminating in the stomach. Lungs/Pleura: Image quality is degraded by respiratory motion. Endotracheal tube terminates in the right mainstem bronchus. Basilar dependent patchy airspace consolidation. No definite pleural fluid. Musculoskeletal: No worrisome lytic or sclerotic lesions. Degenerative changes are seen in the spine. Flowing anterior osteophytosis in the thoracic spine. Review of the MIP images confirms the above findings. CT ABDOMEN and PELVIS FINDINGS Hepatobiliary: A 4.9 x 7.2 cm low-attenuation lesion in the dome of the liver appears to have contrast puddling but is not imaged on the delayed nephrographic phase series. Mildly heterogeneous low-attenuation lesion in the left hepatic lobe measures 5.1 x 5.5 cm. Multiple lesions characterized hemangiomas were reported on examination of 03/03/2003. Images are not available at the time dictation. Today's measurements are larger than on that report. Additional smaller lesions are seen in the caudate and inferior right hepatic lobe. Gallbladder is unremarkable. No biliary ductal dilatation. Pancreas: Negative. Spleen: Negative. Adrenals/Urinary Tract: Adrenal glands are unremarkable. Sub cm low-attenuation lesion in the right kidney is too small to characterize. Ureters are decompressed. Foley catheter and air are seen in a decompressed bladder. Bladder wall appears thickened. Stomach/Bowel: Nasogastric terminates in the stomach. Stomach, small bowel, appendix and colon are unremarkable. Vascular/Lymphatic: Atherosclerotic calcification of the arterial vasculature without abdominal aortic aneurysm. Portacaval lymph node measures 1.5 cm. Reproductive: Prostate is visualized and contains  calcifications. Other: No free fluid. Tiny periumbilical hernia contains fat. Mesenteries and peritoneum are unremarkable. Musculoskeletal: No worrisome lytic or sclerotic lesions. Degenerative changes are seen in the spine and hips. IMPRESSION: 1. Poor assessment of the lobar, segmental and subsegmental pulmonary arteries due to excessive respiratory motion. No definite central pulmonary embolus. 2. Dependent pulmonary parenchymal collapse/consolidation bilaterally. Aspiration and pneumonia are not excluded. 3. Hepatic lesions, previously characterized as hemangiomas on 03/03/2003. Images are not  available for comparison. Some lesions measure larger on today's study. If further assessment is desired, MR abdomen without and with contrast is recommended, when the patient is clinically stable and able to hold his breath, preferably as an outpatient. 4. Bladder wall appears thickened. 5.  Aortic atherosclerosis (ICD10-170.0). Electronically Signed: By: Lorin Picket M.D. On: 08/27/2016 16:37   Dg Chest Port 1 View  Result Date: 09/09/2016 CLINICAL DATA:  Fever, cough for several months EXAM: PORTABLE CHEST 1 VIEW COMPARISON:  09/04/2016 FINDINGS: Cardiomegaly with vascular congestion. No confluent opacities or effusions. No acute bony abnormality. IMPRESSION: Cardiomegaly, vascular congestion. Electronically Signed   By: Rolm Baptise M.D.   On: 09/09/2016 08:18   Dg Chest Port 1 View  Result Date: 09/04/2016 CLINICAL DATA:  Chronic respiratory failure with hypoxia EXAM: PORTABLE CHEST 1 VIEW COMPARISON:  Chest x-ray of August 31, 2016 FINDINGS: The trachea and esophagus have been extubated. The left internal jugular venous catheter has been removed. The right lung is adequately inflated. On the left there is volume loss with obscuration of the hemidiaphragm and portions of the left heart border. The cardiac silhouette remains enlarged. The pulmonary vascularity remains engorged. The trachea is midline. The  bony thorax exhibits no acute abnormality. There is calcification in the wall of the aortic arch. IMPRESSION: Improved appearance of the right lung with decreased interstitial edema. Persistent left basilar atelectasis or pneumonia with small left pleural effusion. Stable cardiomegaly. Thoracic aortic atherosclerosis. Electronically Signed   By: David  Martinique M.D.   On: 09/04/2016 07:13   Dg Chest Port 1 View  Result Date: 08/31/2016 CLINICAL DATA:  Acute respiratory failure EXAM: PORTABLE CHEST 1 VIEW COMPARISON:  08/30/2016 FINDINGS: Support devices are stable. Cardiomegaly with vascular congestion. Increasing bibasilar opacities, likely bibasilar atelectasis. Small left effusion. IMPRESSION: Increasing bibasilar atelectasis and small left effusion. Electronically Signed   By: Rolm Baptise M.D.   On: 08/31/2016 07:57   Dg Chest Port 1 View  Result Date: 08/30/2016 CLINICAL DATA:  Ventilator dependent EXAM: PORTABLE CHEST 1 VIEW COMPARISON:  Yesterday FINDINGS: Endotracheal tube tip is 15 mm above the carina. An orogastric tube reaches the stomach. Left IJ central line with tip at the SVC level. Cardiopericardial enlargement. Interstitial opacities seen yesterday are improved. Lung volumes remain low. No pneumothorax. No definite effusion. IMPRESSION: 1. Endotracheal tube tip is 15 mm above the carina. Other support apparatus in stable position. 2. Improved pulmonary edema compared to yesterday. Electronically Signed   By: Monte Fantasia M.D.   On: 08/30/2016 07:07   Dg Chest Port 1 View  Result Date: 08/29/2016 CLINICAL DATA:  Acute respiratory failure, shortness of breath EXAM: PORTABLE CHEST 1 VIEW COMPARISON:  August 27, 2016 FINDINGS: The heart size and mediastinal contours are stable. The heart size is enlarged. Endotracheal tube is identified distal tip 2.5 cm from carina. Nasogastric tube is identified distal tip not included on film but is at least in the stomach. There are patchy  consolidations throughout bilateral lungs not changed. The visualized skeletal structures are stable. IMPRESSION: Bilateral airspace consolidation in the lungs unchanged compared to prior exam. Cardiomegaly. Endotracheal tube is identified distal tip 2.5 cm from carina. Electronically Signed   By: Abelardo Diesel M.D.   On: 08/29/2016 08:01   Dg Chest Port 1 View  Result Date: 08/27/2016 CLINICAL DATA:  Endotracheal tube placement.  Initial encounter. EXAM: PORTABLE CHEST 1 VIEW COMPARISON:  CTA of the chest performed earlier today at 3:47 p.m. FINDINGS: The patient's endotracheal  tube is seen ending 1-2 cm above the carina. This could be retracted 1-2 cm. An enteric tube is noted extending below the diaphragm. Bilateral airspace opacification is noted, with underlying vascular congestion. This may reflect pulmonary edema or pneumonia. No pleural effusion or pneumothorax is seen. The cardiomediastinal silhouette is enlarged. No acute osseous abnormalities are seen. An external pacing pad is noted. IMPRESSION: 1. Endotracheal tube seen ending 1-2 cm above the carina. This could be retracted 1-2 cm. 2. Bilateral airspace opacification, with underlying vascular congestion. This may reflect pulmonary edema or pneumonia. 3. Cardiomegaly. Electronically Signed   By: Garald Balding M.D.   On: 08/27/2016 21:42   Dg Chest Portable 1 View  Result Date: 08/27/2016 CLINICAL DATA:  Central line placement. EXAM: PORTABLE CHEST 1 VIEW COMPARISON:  08/27/2016. FINDINGS: Stable enlarged cardiac silhouette. Endotracheal tube tip 1.3 cm above the carina and directed toward the right mainstem bronchus. Left jugular catheter tip in the proximal superior vena cava. No pneumothorax. Nasogastric tube extending into the stomach. Significantly less bilateral airspace opacity. Decreased size of a small left pleural effusion. The pulmonary vasculature and interstitial markings remain mildly prominent. Thoracic spine degenerative changes.  IMPRESSION: 1. Left jugular catheter tip in the superior vena cava without pneumothorax. 2. Significantly improved bilateral alveolar edema. 3. Small left pleural effusion, also improved. 4. Stable cardiomegaly, pulmonary vascular congestion and probable mild interstitial pulmonary edema. 5. Low position of the endotracheal tube. It is recommended that this be retracted 6 cm. Electronically Signed   By: Claudie Revering M.D.   On: 08/27/2016 15:16   Dg Chest Portable 1 View  Result Date: 08/27/2016 CLINICAL DATA:  65 year old male with history of syncopal event at home. CPR performed. Patient now intubated. EXAM: PORTABLE CHEST 1 VIEW COMPARISON:  Chest x-ray 06/04/2015. FINDINGS: An endotracheal tube is in place with tip 1.5 cm above the carina. A nasogastric tube is seen extending into the stomach, however, the tip of the nasogastric tube extends below the lower margin of the image. Transcutaneous defibrillator pads projecting over the left hemithorax. Lung volumes are low. Patchy multifocal interstitial and airspace disease asymmetrically distributed in the lungs bilaterally. Probable small left pleural effusion which may be loculated laterally. No right pleural effusion. Pulmonary vasculature is obscured. Heart size appears mildly enlarged. Upper mediastinal contours are partially obscured by airspace disease and slightly distorted by patient's rotation to the left. IMPRESSION: 1. Support apparatus, as above. 2. Multifocal interstitial and airspace disease is slightly asymmetric. While this could represent underlying cardiogenic or noncardiogenic edema, findings may also reflect multilobar pneumonia or sequela of recent aspiration. 3. Probable small partially loculated left pleural effusion at the base of the left hemithorax. Attention on followup studies is recommended. Should this fail to resolve, further evaluation with nonemergent contrast enhanced chest CT should be considered to exclude underlying mass.  Electronically Signed   By: Vinnie Langton M.D.   On: 08/27/2016 13:14   Dg Swallowing Func-speech Pathology  Result Date: 09/12/2016 Objective Swallowing Evaluation: Type of Study: MBS-Modified Barium Swallow Study Patient Details Name: Samar Blaise MRN: [id number] Date of Birth: [birth year] Today's Date: 09/12/2016 Time: SLP Start Time (ACUTE ONLY): 1000-SLP Stop Time (ACUTE ONLY): 1016 SLP Time Calculation (min) (ACUTE ONLY): 16 min Past Medical History: Past Medical History: Diagnosis Date . Arthritis   "maybe in his spine" (09/05/2016) . Asthma  . Cardiac arrest ([place]) 08/27/2016  REQUIRING CPR, SHOCK & MEDICATIONS . Gout  . High cholesterol  . Hypertension  . OSA on  CPAP  . Type II diabetes mellitus ([place])  Past Surgical History: Past Surgical History: Procedure Laterality Date . CARDIAC CATHETERIZATION N/A 09/07/2016  Procedure: Left Heart Cath and Coronary Angiography;  Surgeon: Peter M Martinique, MD;  Location: [facility];  Service: Cardiovascular;  Laterality: N/A; . NO PAST SURGERIES   HPI: Pt is a 65 y.o. male with PMH of asthma, DM, gout, HTN who callapsed pulseless on 12/3 requiring CPR x 21 minutes, 7 rounds of epi, 5 shocks, amiodarone and magnesium, was intubated. EEG 12/4 was markedly abnormal wiht severe diffuse cerebral dysfunction. Pt was extubated 12/7. Pt reportedly doing better on this date, family wanting him to eat. Bedside swallow eval ordered. CXR 12/7 showed increasing bibasilar atelectasis and small left effusion. Subjective: pt alert, pleasant, eager for POs Assessment / Plan / Recommendation CHL IP CLINICAL IMPRESSIONS 09/12/2016 Therapy Diagnosis Suspected primary esophageal dysphagia Clinical Impression Pt demonstrates significant improvement in swallow function, only mild, possibly baseline impairments remain. Pt is noted to have piecemeal transit of all bolus, though he always independently clears oral cavity and swallow transited residuals. Swallow initiation is  intermittently delayed with all liquids, though timing improves with consecutive straw sips. No penetration or aspiration observed even when pressed to a rapid rate and with large boluses. Pt did cough consistently after most swallows, but no penetration or pharyngeal residual accounts for this. Esophageal sweep showed appearance of distal stasis which could account for coughing. Pt may upgrade to a regular diet and thin liquids, though upright posture is still essential. During this test, pt was fully upright in a chair. SLP will follow for tolerance.  Impact on safety and function Mild aspiration risk   [facility] IP TREATMENT RECOMMENDATION 09/12/2016 Treatment Recommendations Therapy as outlined in treatment plan below   Prognosis 09/05/2016 Prognosis for Safe Diet Advancement Good Barriers to Reach Goals Cognitive deficits Barriers/Prognosis Comment -- [facility] IP DIET RECOMMENDATION 09/12/2016 SLP Diet Recommendations Regular solids;Thin liquid Liquid Administration via Cup;Straw Medication Administration Whole meds with liquid Compensations -- Postural Changes Remain semi-upright after after feeds/meals (Comment);Seated upright at 90 degrees   [facility] IP OTHER RECOMMENDATIONS 09/12/2016 Recommended Consults -- Oral Care Recommendations Oral care BID Other Recommendations --   [facility] IP FOLLOW UP RECOMMENDATIONS 09/12/2016 Follow up Recommendations Skilled Nursing facility   [facility] IP FREQUENCY AND DURATION 09/12/2016 Speech Therapy Frequency (ACUTE ONLY) min 2x/week Treatment Duration 1 week      CH[facility] IP ORAL PHASE 09/12/2016 Oral Phase Impaired Oral - Pudding Teaspoon -- Oral - Pudding Cup -- Oral - Honey Teaspoon -- Oral - Honey Cup -- Oral - Nectar Teaspoon -- Oral - Nectar Cup Piecemeal swallowing Oral - Nectar Straw Piecemeal swallowing Oral - Thin Teaspoon -- Oral - Thin Cup Piecemeal swallowing Oral - Thin Straw Piecemeal swallowing Oral - Puree Piecemeal swallowing Oral - Mech Soft -- Oral - Regular Piecemeal swallowing  Oral - Multi-Consistency -- Oral - Pill Piecemeal swallowing Oral Phase - Comment --  [facility] IP PHARYNGEAL PHASE 09/12/2016 Pharyngeal Phase Impaired Pharyngeal- Pudding Teaspoon -- Pharyngeal -- Pharyngeal- Pudding Cup -- Pharyngeal -- Pharyngeal- Honey Teaspoon -- Pharyngeal -- Pharyngeal- Honey Cup -- Pharyngeal -- Pharyngeal- Nectar Teaspoon -- Pharyngeal -- Pharyngeal- Nectar Cup Delayed swallow initiation-pyriform sinuses Pharyngeal -- Pharyngeal- Nectar Straw Delayed swallow initiation-pyriform sinuses Pharyngeal Material does not enter airway Pharyngeal- Thin Teaspoon -- Pharyngeal -- Pharyngeal- Thin Cup Delayed swallow initiation-pyriform sinuses Pharyngeal Material does not enter airway Pharyngeal- Thin Straw Delayed swallow initiation-pyriform sinuses Pharyngeal -- Pharyngeal- Puree Delayed  swallow initiation-vallecula Pharyngeal -- Pharyngeal- Mechanical Soft Delayed swallow initiation-vallecula Pharyngeal -- Pharyngeal- Regular Delayed swallow initiation-vallecula Pharyngeal -- Pharyngeal- Multi-consistency -- Pharyngeal -- Pharyngeal- Pill Delayed swallow initiation-pyriform sinuses Pharyngeal -- Pharyngeal Comment --  [facility] IP CERVICAL ESOPHAGEAL PHASE 09/12/2016 Cervical Esophageal Phase WFL Pudding Teaspoon -- Pudding Cup -- Honey Teaspoon -- Honey Cup -- Nectar Teaspoon -- Nectar Cup -- Nectar Straw -- Thin Teaspoon -- Thin Cup -- Thin Straw -- Puree -- Mechanical Soft -- Regular -- Multi-consistency -- Pill -- Cervical Esophageal Comment -- No flowsheet data found. [name], MA CCC-SLP [phone] [place], Katherene Ponto 09/12/2016, 10:32 AM               Micro Results    Recent Results (from the past 240 hour(s))  Culture, blood (routine x 2)     Status: Abnormal   Collection Time: 09/09/16  5:20 AM  Result Value Ref Range Status   Specimen Description BLOOD RIGHT HAND  Final   Special Requests BOTTLES DRAWN AEROBIC ONLY 6CC  Final   Culture  Setup Time   Final    GRAM NEGATIVE  RODS AEROBIC BOTTLE ONLY CRITICAL RESULT CALLED TO, READ BACK BY AND VERIFIED WITH: A MASTERS,PHARMD AT 1244 09/10/16 BY L BENFIELD    Culture CITROBACTER KOSERI (A)  Final   Report Status 09/13/2016 FINAL  Final   Organism ID, Bacteria CITROBACTER KOSERI  Final      Susceptibility   Citrobacter koseri - MIC*    CEFAZOLIN <=4 SENSITIVE Sensitive     CEFEPIME <=1 SENSITIVE Sensitive     CEFTAZIDIME <=1 SENSITIVE Sensitive     CEFTRIAXONE <=1 SENSITIVE Sensitive     CIPROFLOXACIN <=0.25 SENSITIVE Sensitive     GENTAMICIN <=1 SENSITIVE Sensitive     IMIPENEM <=0.25 SENSITIVE Sensitive     TRIMETH/SULFA <=20 SENSITIVE Sensitive     PIP/TAZO <=4 SENSITIVE Sensitive     * CITROBACTER KOSERI  Blood Culture ID Panel (Reflexed)     Status: Abnormal   Collection Time: 09/09/16  5:20 AM  Result Value Ref Range Status   Enterococcus species NOT DETECTED NOT DETECTED Final   Listeria monocytogenes NOT DETECTED NOT DETECTED Final   Staphylococcus species NOT DETECTED NOT DETECTED Final   Staphylococcus aureus NOT DETECTED NOT DETECTED Final   Streptococcus species NOT DETECTED NOT DETECTED Final   Streptococcus agalactiae NOT DETECTED NOT DETECTED Final   Streptococcus pneumoniae NOT DETECTED NOT DETECTED Final   Streptococcus pyogenes NOT DETECTED NOT DETECTED Final   Acinetobacter baumannii NOT DETECTED NOT DETECTED Final   Enterobacteriaceae species DETECTED (A) NOT DETECTED Final    Comment: CRITICAL RESULT CALLED TO, READ BACK BY AND VERIFIED WITH: A MASTERS,PHARMD AT 1244 09/10/16 BY L BENFIELD    Enterobacter cloacae complex NOT DETECTED NOT DETECTED Final   Escherichia coli NOT DETECTED NOT DETECTED Final   Klebsiella oxytoca NOT DETECTED NOT DETECTED Final   Klebsiella pneumoniae NOT DETECTED NOT DETECTED Final   Proteus species NOT DETECTED NOT DETECTED Final   Serratia marcescens NOT DETECTED NOT DETECTED Final   Carbapenem resistance NOT DETECTED NOT DETECTED Final    Haemophilus influenzae NOT DETECTED NOT DETECTED Final   Neisseria meningitidis NOT DETECTED NOT DETECTED Final   Pseudomonas aeruginosa NOT DETECTED NOT DETECTED Final   Candida albicans NOT DETECTED NOT DETECTED Final   Candida glabrata NOT DETECTED NOT DETECTED Final   Candida krusei NOT DETECTED NOT DETECTED Final   Candida parapsilosis NOT DETECTED NOT DETECTED Final  Candida tropicalis NOT DETECTED NOT DETECTED Final  Culture, blood (routine x 2)     Status: None   Collection Time: 09/09/16  5:50 AM  Result Value Ref Range Status   Specimen Description BLOOD RIGHT HAND  Final   Special Requests IN PEDIATRIC BOTTLE 2CC  Final   Culture NO GROWTH 5 DAYS  Final   Report Status 09/14/2016 FINAL  Final  Culture, Urine     Status: None   Collection Time: 09/09/16  8:11 AM  Result Value Ref Range Status   Specimen Description URINE, RANDOM  Final   Special Requests NONE  Final   Culture NO GROWTH  Final   Report Status 09/10/2016 FINAL  Final  Respiratory Panel by PCR     Status: None   Collection Time: 09/10/16 10:46 AM  Result Value Ref Range Status   Adenovirus NOT DETECTED NOT DETECTED Final   Coronavirus 229E NOT DETECTED NOT DETECTED Final   Coronavirus HKU1 NOT DETECTED NOT DETECTED Final   Coronavirus NL63 NOT DETECTED NOT DETECTED Final   Coronavirus OC43 NOT DETECTED NOT DETECTED Final   Metapneumovirus NOT DETECTED NOT DETECTED Final   Rhinovirus / Enterovirus NOT DETECTED NOT DETECTED Final   Influenza A NOT DETECTED NOT DETECTED Final   Influenza B NOT DETECTED NOT DETECTED Final   Parainfluenza Virus 1 NOT DETECTED NOT DETECTED Final   Parainfluenza Virus 2 NOT DETECTED NOT DETECTED Final   Parainfluenza Virus 3 NOT DETECTED NOT DETECTED Final   Parainfluenza Virus 4 NOT DETECTED NOT DETECTED Final   Respiratory Syncytial Virus NOT DETECTED NOT DETECTED Final   Bordetella pertussis NOT DETECTED NOT DETECTED Final   Chlamydophila pneumoniae NOT DETECTED NOT  DETECTED Final   Mycoplasma pneumoniae NOT DETECTED NOT DETECTED Final  Culture, blood (Routine X 2) w Reflex to ID Panel     Status: None (Preliminary result)   Collection Time: 09/12/16  5:45 AM  Result Value Ref Range Status   Specimen Description BLOOD RIGHT ASSIST CONTROL  Final   Special Requests IN PEDIATRIC BOTTLE 3CC  Final   Culture NO GROWTH 3 DAYS  Final   Report Status PENDING  Incomplete  Culture, blood (Routine X 2) w Reflex to ID Panel     Status: None (Preliminary result)   Collection Time: 09/12/16  5:52 AM  Result Value Ref Range Status   Specimen Description BLOOD RIGHT HAND  Final   Special Requests IN PEDIATRIC BOTTLE 2CC  Final   Culture NO GROWTH 3 DAYS  Final   Report Status PENDING  Incomplete       Today   Subjective:   Kwami Winkelman today has no headache,no chest or abdominal pain, feels much better  today.   Objective:   Blood pressure (!) 141/48, pulse (!) 58, temperature 97.4 F (36.3 C), temperature source Oral, resp. rate 18, height 5\' 6"  (1.676 m), weight 116.6 kg (257 lb 1.6 oz), SpO2 100 %.   Intake/Output Summary (Last 24 hours) at 09/15/16 1248 Last data filed at 09/15/16 0914  Gross per 24 hour  Intake              770 ml  Output             1150 ml  Net             -380 ml    Exam General exam: Pleasant middle-aged male sitting up comfortably In bed. Does not look septic or toxic. Respiratory system: clear to  auscultation. Respiratory effort normal. Cardiovascular system: S1 & S2 heard, RRR. No JVD, murmurs, rubs, gallops or clicks. No pedal edema. Telemetry: Sinus rhythm.  No further episodes of NSVT in the last 72 hours.  Gastrointestinal system: Abdomen is nondistended, soft and nontender. No organomegaly or masses felt. Normal bowel sounds heard. Central nervous system: Alert and oriented to person, place and partly to time (December 2017) . No focal neurological deficits. Extremities: Symmetric 5 x 5 power. Skin: No  rashes, lesions or ulcers Psychiatry: Judgement and insight impaired . Mood & affect appropriate.  Data Review   CBC w Diff: Lab Results  Component Value Date   WBC 10.0 09/14/2016   HGB 10.9 (L) 09/14/2016   HCT 34.5 (L) 09/14/2016   PLT 289 09/14/2016   LYMPHOPCT 26 09/12/2016   MONOPCT 12 09/12/2016   EOSPCT 4 09/12/2016   BASOPCT 1 09/12/2016    CMP: Lab Results  Component Value Date   NA 138 09/14/2016   K 3.9 09/14/2016   CL 106 09/14/2016   CO2 24 09/14/2016   BUN 13 09/14/2016   CREATININE 1.09 09/14/2016   PROT 6.7 09/12/2016   ALBUMIN 2.7 (L) 09/12/2016   BILITOT 1.1 09/12/2016   ALKPHOS 91 09/12/2016   AST 51 (H) 09/12/2016   ALT 59 09/12/2016  .   Total Time in preparing paper work, data evaluation and todays exam - 35 minutes  Beonka Amesquita M.D on 09/15/2016 at 12:48 PM  [facility]   Office  [phone]

## 2016-09-15 NOTE — Progress Notes (Signed)
Dean [name] [place], MD Physician Signed Physical Medicine and Rehabilitation  PMR Pre-admission Date of Service: 09/14/2016 2:52 PM  Related encounter: ED to Hosp-Admission (Current) from 08/27/2016 in [facility]       '[]' Hide copied text PMR Admission Coordinator Pre-Admission Assessment  Patient: Dean [name] is an 65 y.o., male MRN: [id number] DOB: 03-25-1951 Height: '5\' 6"'  (167.6 cm) Weight: 116.6 kg (257 lb 1.6 oz) ( scale a)                                                                                                                                                  Insurance Information HMO:     PPO: Yes     PCP:       IPA:       80/20:       OTHER:   PRIMARY: [employer]are      Policy#: [id number]      Subscriber: Theophilus Bones CM Name: Quincy Sheehan      Phone#: [phone]     Fax#:  [phone] Pre-Cert#: [id number] for max 9 days to be discharged on or before 09/24/16      Employer: Disabled Benefits:  Phone #: [phone]     Name:  Milinda Pointer. Date:  03/25/12 with term date 09/24/16 and a switch to [employer]     Deduct:  $0      Out of Pocket Max: $5500 (met [id number])      Life Max: Unlimited CIR: $265/day with max $1590 per admit      SNF: $0 days 1-20; $164 days 21-100 Outpatient: medical necessity     Co-Pay: $40/visit Home Health: 100%      Co-Pay: none DME: 80%     Co-Pay: 20% Providers: in network  Emergency Contact Information        Contact Information    Name Relation Home Work [place] Spouse [phone]  [phone]   [place] Daughter   [phone]   [place]ell,Deedee Daughter   [phone]   Marvion, Bastidas   [phone]     Current Medical History  Patient Admitting Diagnosis: Debilitation/cardiopulmonary arrest/anoxic encephalopathy   History of Present Illness: A 65 y.o.right handed malewith history of asthma, diabetes mellitus, gout and hypertension. Per  chart review patient lives with spouse independent prior to admission. One level home. Presented 08/27/2016 after he collapsed walking back from the bathroom. CPR not performed by family and EMS arrived 10 minutes later, performedCPR 21 minutes with 7 rounds of epi, 5 shocks,amiodarone,and magnesium given. He coded again in route to the hospital. Cranial CT scan negative. Troponin 1.54-2.61. Creatinine 1.31. WBC 22,100. EKG showed sinus rhythm with wide QRS/LBBBpossible old anterior MI. Chest x-ray showed resolving interstitial pulmonary edema. He did not meet criteria for STEMIor urgent cardiac cath. Echocardiogram with ejection fraction of 40% grade  2 diastolic dysfunction. Akinesis of the basal inferior and apical myocardium. There was concerns of possible seizure with neurology consulted. EEG with diffuse background suppression but no obvious seizure noted. Patient remained intubated until 08/31/2016. Underwent cardiac catheterization 09/07/2016 showing single-vessel occlusive CAD. The distal RCA occluded with left-to-right collaterals. Recommendations were for medical therapy as well as a life vest being placed.Maintained on subcutaneous heparin for DVT prophylaxis.Tolerating a regular diet. Citrobacter bacteremia treated with intravenous Zosyn and transitioned to Bactrim for a total of 14 days.Physical therapy evaluation completed 09/04/2016 and recommendations of physical medicine rehabilitation consult. Patient to be admitted for a comprehensive inpatient rehabilitation program.  . Past Medical History      Past Medical History:  Diagnosis Date  . Arthritis    "maybe in his spine" (09/05/2016)  . Asthma   . Cardiac arrest ([place]) 08/27/2016   REQUIRING CPR, SHOCK & MEDICATIONS  . Gout   . High cholesterol   . Hypertension   . OSA on CPAP   . Type II diabetes mellitus (HCC)     Family History  family history includes Cancer in his brother; Heart attack in his father;  Hypertension in his sister and sister.  Prior Rehab/Hospitalizations: No previous rehab admissions  Has the patient had major surgery during 100 days prior to admission? No  Current Medications   Current Facility-Administered Medications:  .  0.9 %  sodium chloride infusion, 250 mL, Intravenous, PRN, Peter M Martinique, MD .  acetaminophen (TYLENOL) tablet 650 mg, 650 mg, Oral, Q4H PRN, Ritta Slot, NP, 650 mg at 09/10/16 1950 .  amiodarone (PACERONE) tablet 400 mg, 400 mg, Oral, BID, Renee Dyane Dustman, PA-C, 400 mg at 09/15/16 1016 .  aspirin EC tablet 81 mg, 81 mg, Oral, Daily, Lelon Perla, MD, 81 mg at 09/15/16 1016 .  atorvastatin (LIPITOR) tablet 40 mg, 40 mg, Oral, q1800, Lelon Perla, MD, 40 mg at 09/14/16 1652 .  carvedilol (COREG) tablet 12.5 mg, 12.5 mg, Oral, BID WC, Rogelia Mire, NP, 12.5 mg at 09/15/16 0600 .  cefTRIAXone (ROCEPHIN) 2 g in dextrose 5 % 50 mL IVPB, 2 g, Intravenous, Q24H, Albertine Patricia, MD, 2 g at 09/14/16 1339 .  chlorhexidine (PERIDEX) 0.12 % solution 15 mL, 15 mL, Mouth Rinse, BID, Rush Farmer, MD, 15 mL at 09/15/16 1017 .  docusate (COLACE) 50 MG/5ML liquid 100 mg, 100 mg, Oral, Daily, Modena Jansky, MD, 100 mg at 09/15/16 1218 .  famotidine (PEPCID) tablet 20 mg, 20 mg, Oral, BID, Modena Jansky, MD, 20 mg at 09/15/16 1016 .  feeding supplement (GLUCERNA SHAKE) (GLUCERNA SHAKE) liquid 237 mL, 237 mL, Oral, Q24H, Dawood S Elgergawy, MD, 237 mL at 09/14/16 2100 .  furosemide (LASIX) tablet 20 mg, 20 mg, Oral, Daily, Peter M Martinique, MD, 20 mg at 09/15/16 1017 .  heparin injection 5,000 Units, 5,000 Units, Subcutaneous, Q8H, Peter M Martinique, MD, 5,000 Units at 09/15/16 0559 .  hydrALAZINE (APRESOLINE) injection 10 mg, 10 mg, Intravenous, Q6H PRN, Dorothy Spark, MD, 10 mg at 09/08/16 0513 .  insulin aspart (novoLOG) injection 0-9 Units, 0-9 Units, Subcutaneous, TID WC, Modena Jansky, MD, 1 Units at 09/13/16 1246 .  lisinopril  (PRINIVIL,ZESTRIL) tablet 5 mg, 5 mg, Oral, Daily, Eileen Stanford, PA-C, 5 mg at 09/15/16 1016 .  MEDLINE mouth rinse, 15 mL, Mouth Rinse, q12n4p, Rush Farmer, MD, 15 mL at 09/13/16 1200 .  multivitamin with minerals tablet 1 tablet, 1 tablet,  Oral, Daily, Albertine Patricia, MD, 1 tablet at 09/15/16 1016 .  ondansetron (ZOFRAN) injection 4 mg, 4 mg, Intravenous, Q6H PRN, Peter M Martinique, MD .  sodium chloride flush (NS) 0.9 % injection 10-40 mL, 10-40 mL, Intracatheter, PRN, Rush Farmer, MD .  sodium chloride flush (NS) 0.9 % injection 3 mL, 3 mL, Intravenous, Q12H, Peter M Martinique, MD, 3 mL at 09/14/16 2145 .  sodium chloride flush (NS) 0.9 % injection 3 mL, 3 mL, Intravenous, PRN, Peter M Martinique, MD  Patients Current Diet: Diet Heart Room service appropriate? Yes; Fluid consistency: Thin  Precautions / Restrictions Precautions Precautions: Fall Restrictions Weight Bearing Restrictions: No   Has the patient had 2 or more falls or a fall with injury in the past year?No  Prior Activity Level Community (5-7x/wk): Went out daily.  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  Home Assistive Devices / Equipment Home Assistive Devices/Equipment: CPAP, CBG Meter Home Equipment: None  Prior Device Use: Indicate devices/aids used by the patient prior to current illness, exacerbation or injury? 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  Independent  Indoor Mobility: Did the patient need assistance with walking from room to room (with or without device)? Independent  Stairs: Did the patient need assistance with internal or external stairs (with or without device)? Independent  Functional Cognition: Did the patient need help planning regular tasks such  as shopping or remembering to take medications? Independent  Current Functional Level Cognition Overall Cognitive Status: Impaired/Different from baseline Current Attention Level: Sustained Orientation Level: Oriented to person Following Commands: Follows one step commands consistently Safety/Judgement: Decreased awareness of safety General Comments: Asked him if he had family here in [name] and he said yes his mom and dad so I asked him if they were in their 59's and he said no they were deceased. Asked him how many brother and sisters he had and it too him increased time to tell me the number, when asked where he fell in the mix of 10 children, he tried to tell me but could not come up with the answer. He did now it was Dec and 2017.    Extremity Assessment (includes Sensation/Coordination) Upper Extremity Assessment: Generalized weakness  Lower Extremity Assessment: Defer to PT evaluation   ADLs Overall ADL's : Needs assistance/impaired Eating/Feeding: Independent Grooming: Wash/dry hands, Min guard, Standing, Wash/dry face Upper Body Bathing: Min guard, Sitting (simulated) Upper Body Bathing Details (indicate cue type and reason): for thoroughness  Lower Body Bathing: Moderate assistance, Sit to/from stand, Minimal assistance, Sitting/lateral leans (simulated) Upper Body Dressing : Min guard, Standing Lower Body Dressing: Min guard Lower Body Dressing Details (indicate cue type and reason): donned socks sitting EOB, required increased time and effort Toilet Transfer: Min guard, Ambulation, RW, Grab bars, Cueing for safety Toilet Transfer Details (indicate cue type and reason): recliner>out door and back into room>bed Toileting- Water quality scientist and Hygiene: Sit to/from stand, Minimal assistance Toileting - Clothing Manipulation Details (indicate cue type and reason): pt incontinent of stool.  Assisted with peri care  Tub/ Shower Transfer: Min guard, Grab bars, Ambulation,  Rolling walker Functional mobility during ADLs: Rolling walker, Min guard, Cueing for safety   Mobility Overal bed mobility: Independent Bed Mobility: Supine to Sit, Sit to Supine Supine to  sit: Min guard, HOB elevated Sit to supine: Min guard General bed mobility comments: pt sitting OOB in recliner when PT entered room and sitting EOB at end of session   Transfers Overall transfer level: Needs assistance Equipment used: Rolling walker (2 wheeled) Transfers: Sit to/from Stand Sit to Stand: Min guard Stand pivot transfers: Min assist General transfer comment: VCs for safe hand placement and positioning prior to elevation to upright, min guard for safety   Ambulation / Gait / Stairs / Wheelchair Mobility Ambulation/Gait Ambulation/Gait assistance: Physicist, medical (Feet): 200 Feet Assistive device: Rolling walker (2 wheeled) Gait Pattern/deviations: Step-through pattern, Decreased stride length, Drifts right/left General Gait Details: pt with min instability during ambulation, no physical assistance needed; however, pt frequently drifting R/L and grazing objects with his RW Gait velocity: decreased Gait velocity interpretation: Below normal speed for age/gender   Posture / Balance Dynamic Sitting Balance Sitting balance - Comments: Able to sit EOB and perform dynmaic LE ROM without difficulty Balance Overall balance assessment: Needs assistance Sitting-balance support: Feet supported, No upper extremity supported Sitting balance-Leahy Scale: Good Sitting balance - Comments: Able to sit EOB and perform dynmaic LE ROM without difficulty Standing balance support: Single extremity supported Standing balance-Leahy Scale: Poor Standing balance comment: reliance on RW for UE support in static standing   Special needs/care consideration BiPAP/CPAP Yes CPM No Continuous Drip IV No  Dialysis No       Life Vest: Yes Oxygen No Special Bed No Trach Size NO Wound Vac (area) No       Skin Bruise on right arm                            Bowel mgmt: Last BM 09/14/16 Bladder mgmt: Incontinent at times Diabetic mgmt Yes on oral medication, Metformin, at home   Previous Home Environment Living Arrangements: Spouse/significant other, Children Available Help at Discharge: Family Home Layout: One level Home Access: Level entry Home Care Services: No Additional Comments: Pt unable to provide accurate history.   Pt states he has 5 children, but he is unsure if any live at home, or live close by   Discharge Living Setting Plans for Discharge Living Setting: Patient's home, House, Lives with (comment) (Lives with wife, dtr, and son.) Type of Home at Discharge: House Discharge Home Layout: Two level, Bed/bath upstairs, Able to live on main level with bedroom/bathroom Alternate Level Stairs-Number of Steps: 13 steps Discharge Home Access: Level entry Does the patient have any problems obtaining your medications?: No  Social/Family/Support Systems Patient Roles: Spouse, Parent (Has wife, daughter, son.) Contact Information: Secondary school teacher - spouse Anticipated Caregiver: wife, daughter Anticipated Caregiver's Contact Information: Kathi Ludwig - wife - [phone] Ability/Limitations of Caregiver: Wife can assist.  Sons with Downs syndrome.  Dtr works. Caregiver Availability: 24/7 Discharge Plan Discussed with Primary Caregiver: Yes Is Caregiver In Agreement with Plan?: Yes Does Caregiver/Family have Issues with Lodging/Transportation while Pt is in Rehab?: No  Goals/Additional Needs Patient/Family Goal for Rehab: PT/OT/SLP mod I and supervision goals Expected length of stay: 7-9 days (insurance terms on 09/24/16) Cultural Considerations: Holiness Dietary Needs: Heart diet, thin liquids Equipment Needs: TBD Special Service Needs: Life vest placed prior to admission to inpatient rehab Pt/Family Agrees to Admission and willing to participate: Yes Program  Orientation Provided & Reviewed with Pt/Caregiver Including Roles  & Responsibilities: Yes  Decrease burden of Care through IP rehab admission: No  Possible need for SNF placement upon  discharge: Not anticipated, expect shorter LOS than listed above.  Patient Condition: This patient's medical and functional status has changed since the consult dated: 09/05/16 in which the Rehabilitation Physician determined and documented that the patient's condition is appropriate for intensive rehabilitative care in an inpatient rehabilitation facility. See "History of Present Illness" (above) for medical update. Functional changes are:  Currently requiring minguard assist to ambulate 120 feet RW. Patient's medical and functional status update has been discussed with the Rehabilitation physician and patient remains appropriate for inpatient rehabilitation. Will admit to inpatient rehab today.  Preadmission Screen Completed By:  Retta Diones, 09/15/2016 12:49 PM ______________________________________________________________________   Discussed status with Dr. Posey Pronto on 09/15/16 at 1244 and received telephone approval for admission today.  Admission Coordinator:  Retta Diones, time1244/Date12/22/17

## 2016-09-15 NOTE — Discharge Instructions (Signed)
Follow with Primary MD [name], MD after discharge  Get CBC, CMP,  checked  by Primary MD next visit.    Activity: As tolerated with Full fall precautions use walker/cane & assistance as needed   Disposition CIR   Diet: Heart Healthy  , with feeding assistance and aspiration precautions.  For Heart failure patients - Check your Weight same time everyday, if you gain over 2 pounds, or you develop in leg swelling, experience more shortness of breath or chest pain, call your Primary MD immediately. Follow Cardiac Low Salt Diet and 1.5 lit/day fluid restriction.   On your next visit with your primary care physician please Get Medicines reviewed and adjusted.   Please request your Prim.MD to go over all Hospital Tests and Procedure/Radiological results at the follow up, please get all Hospital records sent to your Prim MD by signing hospital release before you go home.   If you experience worsening of your admission symptoms, develop shortness of breath, life threatening emergency, suicidal or homicidal thoughts you must seek medical attention immediately by calling 911 or calling your MD immediately  if symptoms less severe.  You Must read complete instructions/literature along with all the possible adverse reactions/side effects for all the Medicines you take and that have been prescribed to you. Take any new Medicines after you have completely understood and accpet all the possible adverse reactions/side effects.   Do not drive, operating heavy machinery, perform activities at heights, swimming or participation in water activities or provide baby sitting services if your were admitted for syncope or siezures until you have seen by Primary MD or a Neurologist and advised to do so again.  Do not drive when taking Pain medications.    Do not take more than prescribed Pain, Sleep and Anxiety Medications  Special Instructions: If you have smoked or chewed Tobacco  in the last 2 yrs  please stop smoking, stop any regular Alcohol  and or any Recreational drug use.  Wear Seat belts while driving.   Please note  You were cared for by a hospitalist during your hospital stay. If you have any questions about your discharge medications or the care you received while you were in the hospital after you are discharged, you can call the unit and asked to speak with the hospitalist on call if the hospitalist that took care of you is not available. Once you are discharged, your primary care physician will handle any further medical issues. Please note that NO REFILLS for any discharge medications will be authorized once you are discharged, as it is imperative that you return to your primary care physician (or establish a relationship with a primary care physician if you do not have one) for your aftercare needs so that they can reassess your need for medications and monitor your lab values.

## 2016-09-15 NOTE — Progress Notes (Signed)
Physical Therapy Treatment Patient Details Name: Dean [name] MRN: [id number] DOB: 20-Mar-1951 Today's Date: 09/15/2016    History of Present Illness Pt is a 65 y.o. male with PMH of asthma, DM, gout, HTN who callapsed pulseless on 12/3. No CPR x 10 minutes then requiring CPR x 21 minutes, 7 rounds of epi, 5 shocks, amiodarone and magnesium, was intubated. EEG 12/4 was markedly abnormal wiht severe diffuse cerebral dysfunction. Pt was extubated 12/7,  PMH - asthma, DM, HTN, gout    PT Comments    Pt presented sitting OOB in recliner when PT entered room. Pt making good progress with mobility; however, continues to be fatigued and with impaired safety/awareness during ambulation. Pt would continue to benefit from skilled physical therapy services at this time while admitted and after d/c to address his limitations in order to improve his overall safety and independence with functional mobility.   Follow Up Recommendations  CIR     Equipment Recommendations  Rolling walker with 5" wheels    Recommendations for Other Services       Precautions / Restrictions Precautions Precautions: Fall Restrictions Weight Bearing Restrictions: No    Mobility  Bed Mobility               General bed mobility comments: pt sitting OOB in recliner when PT entered room and sitting EOB at end of session  Transfers Overall transfer level: Needs assistance Equipment used: Rolling walker (2 wheeled) Transfers: Sit to/from Stand Sit to Stand: Min guard         General transfer comment: VCs for safe hand placement and positioning prior to elevation to upright, min guard for safety  Ambulation/Gait Ambulation/Gait assistance: Min guard Ambulation Distance (Feet): 200 Feet Assistive device: Rolling walker (2 wheeled) Gait Pattern/deviations: Step-through pattern;Decreased stride length;Drifts right/left Gait velocity: decreased Gait velocity interpretation: Below normal speed for  age/gender General Gait Details: pt with min instability during ambulation, no physical assistance needed; however, pt frequently drifting R/L and grazing objects with his RW   Stairs            Wheelchair Mobility    Modified Rankin (Stroke Patients Only)       Balance Overall balance assessment: Needs assistance Sitting-balance support: Feet supported;No upper extremity supported Sitting balance-Leahy Scale: Good     Standing balance support: Single extremity supported Standing balance-Leahy Scale: Poor Standing balance comment: reliance on RW for UE support in static standing                    Cognition Arousal/Alertness: Awake/alert Behavior During Therapy: WFL for tasks assessed/performed Overall Cognitive Status: Impaired/Different from baseline Area of Impairment: Safety/judgement         Safety/Judgement: Decreased awareness of safety          Exercises      General Comments        Pertinent Vitals/Pain Pain Assessment: No/denies pain    Home Living                      Prior Function            PT Goals (current goals can now be found in the care plan section) Acute Rehab PT Goals Patient Stated Goal: eat and drink PT Goal Formulation: With patient Time For Goal Achievement: 09/18/16 Potential to Achieve Goals: Good Progress towards PT goals: Progressing toward goals    Frequency    Min 3X/week      PT Plan  Current plan remains appropriate    Co-evaluation             End of Session Equipment Utilized During Treatment: Gait belt Activity Tolerance: Patient limited by fatigue Patient left: in bed;with call bell/phone within reach;with family/visitor present;Other (comment);with bed alarm set (pt sitting EOB)     Time: [id number] PT Time Calculation (min) (ACUTE ONLY): 26 min  Charges:  $Gait Training: 23-37 mins                    G Codes:      Clearnce Sorrel Jaynia Fendley 09/29/2016, 10:10 AM Sherie Don, PT, DPT [phone]

## 2016-09-15 NOTE — Clinical Social Work Note (Signed)
Per MD in progression this morning, patient will discharge to CIR today.  CSW signing off. Consult again if any other social work needs arise.  Dayton Scrape, [place]

## 2016-09-15 NOTE — Progress Notes (Signed)
Ankit Lorie Phenix, MD Physician Signed Physical Medicine and Rehabilitation  Consult Note Date of Service: 09/05/2016 5:59 AM  Related encounter: ED to Hosp-Admission (Current) from 08/27/2016 in [facility]     Expand All Collapse All   [] Hide copied text [] Hover for attribution information      Physical Medicine and Rehabilitation Consult Reason for Consult: Debilitation/cardiopulmonary arrest/anoxic encephalopathy Referring Physician: Critical care  HPI: Dean [name] is a 65 y.o. right handed male with history of asthma, diabetes mellitus, gout and hypertension. Per chart review patient lives with spouse independent prior to admission. One level home. Presented 08/27/2016 after he collapsed walking back from the bathroom. CPR not performed by family and EMS arrived 10 minutes later, performed CPR 21 minutes with 7 rounds of epi, 5 shocks, amiodarone, and magnesium given. He coded again in route to the hospital. Cranial CT scan negative. Troponin 1.54-2.61. Creatinine 1.31. WBC 22,100. EKG showed sinus rhythm with wide QRS/LBBB possible old anterior MI. Chest x-ray showed resolving interstitial pulmonary edema. He did not meet criteria for STEMI or urgent cardiac cath. Echocardiogram with ejection fraction of [id number] grade 2 diastolic dysfunction. Akinesis of the basal inferior and apical myocardium. There was concerns of possible seizure with neurology consulted. EEG with diffuse background suppression but no obvious seizure noted. Patient remained intubated until 08/31/2016. Cardiology service is following and plan is for cardiac catheterization for further evaluation when patient can follow full commands and tolerate procedure. Maintained on subcutaneous heparin for DVT prophylaxis. Physical therapy evaluation completed 09/04/2016 and recommendations of physical medicine rehabilitation consult.  Review of Systems  Constitutional: Negative for chills and fever.    Respiratory: Negative for shortness of breath.   Cardiovascular: Negative for chest pain.  Genitourinary:       Retention  Musculoskeletal: Positive for myalgias.  Neurological: Positive for weakness.  All other systems reviewed and are negative.      Past Medical History:  Diagnosis Date  . Asthma   . Diabetes mellitus without complication ([place])   . Gout   . Hypertension    No past surgical history per pt. No pertinent family history of premature cardiac arrest per pt. Social History:  reports that he has never smoked. He does not have any smokeless tobacco history on file. He reports that he does not drink alcohol or use drugs. Allergies: No Known Allergies       Medications Prior to Admission  Medication Sig Dispense Refill  . allopurinol (ZYLOPRIM) 300 MG tablet Take 300 mg by mouth daily.    [name] amLODipine (NORVASC) 10 MG tablet Take 10 mg by mouth daily.    . cloNIDine (CATAPRES) 0.2 MG tablet Take 0.2 mg by mouth daily.    . [employer] VITAMIN D3 1000 units capsule Take 1,000 Units by mouth daily.  5  . furosemide (LASIX) 40 MG tablet Take 40 mg by mouth daily.    . Ipratropium-Albuterol (COMBIVENT RESPIMAT) 20-100 MCG/ACT AERS respimat Inhale 1 puff into the lungs every 6 (six) hours as needed for wheezing or shortness of breath.    . isosorbide-hydrALAZINE (BIDIL) 20-37.5 MG tablet Take 1 tablet by mouth 3 (three) times daily.    . metFORMIN (GLUCOPHAGE) 500 MG tablet Take 500 mg by mouth 2 (two) times daily with a meal.     . metoprolol (TOPROL-XL) 200 MG 24 hr tablet Take 300 mg by mouth daily.    [name] PRESCRIPTION MEDICATION Inhale into the lungs at bedtime. CPAP    .  rosuvastatin (CRESTOR) 20 MG tablet Take 20 mg by mouth daily.    [name] spironolactone (ALDACTONE) 50 MG tablet Take 50 mg by mouth daily.      Home: Home Living Family/patient expects to be discharged to:: Private residence Living Arrangements: Spouse/significant other Available  Help at Discharge: Family Home Access: Level entry [place]: One [place]: None Additional Comments: Information from pt. Unable to confirm with family if this is accurate.  Functional History: Prior Function Level of Independence: Independent Functional Status:  Mobility: Bed Mobility Overal bed mobility: Needs Assistance Bed Mobility: Supine to Sit Supine to sit: Mod assist, +2 for safety/equipment General bed mobility comments: Assist to bring legs off bed and to elevate trunk into sitting Transfers Overall transfer level: Needs assistance Equipment used: Rolling walker (2 wheeled) Transfers: Sit to/from Stand, Stand Pivot Transfers Sit to Stand: +2 physical assistance, Mod assist Stand pivot transfers: +2 physical assistance, Min assist General transfer comment: Assist to bring hips up and for balance to stand. Assist for balance with pivotal steps to chair with walker.      ADL:    Cognition: Cognition Overall Cognitive Status: Impaired/Different from baseline Orientation Level: Oriented to person, Oriented to place, Disoriented to situation, Disoriented to time Cognition Arousal/Alertness: Awake/alert Behavior During Therapy: Flat affect, Impulsive Overall Cognitive Status: Impaired/Different from baseline Area of Impairment: Problem solving, Memory, Attention, Safety/judgement Current Attention Level: Sustained Memory: Decreased short-term memory Safety/Judgement: Decreased awareness of deficits, Decreased awareness of safety Problem Solving: Slow processing, Decreased initiation, Requires verbal cues, Requires tactile cues  Blood pressure 138/74, pulse 74, temperature 98.8 F (37.1 C), temperature source Oral, resp. rate 16, height 5\' 5"  (1.651 m), weight 117.3 kg (258 lb 9.6 oz), SpO2 95 %. Physical Exam  Vitals reviewed. Constitutional: He appears well-developed.  Obese  HENT:  Head: Normocephalic and atraumatic.  Eyes: Conjunctivae and  EOM are normal.  Neck: Normal range of motion. Neck supple. No thyromegaly present.  Cardiovascular: Normal rate and regular rhythm.   Respiratory: Effort normal and breath sounds normal. No respiratory distress.  GI: Soft. Bowel sounds are normal. He exhibits no distension. There is no tenderness.  Musculoskeletal: He exhibits no edema or tenderness.  Neurological: He is alert.  Makes good eye contact with examiner.  He did follow simple commands.  Very limited awareness of deficits. Alert and oriented x2 Motor: 4+/5 throughout (right stronger than left) Sensation intact to light touch  Skin: Skin is warm and dry.  Psychiatric: His affect is blunt. He is slowed.    Lab Results Last 24 Hours       Results for orders placed or performed during the hospital encounter of 08/27/16 (from the past 24 hour(s))  Glucose, capillary     Status: Abnormal   Collection Time: 09/04/16 12:10 PM  Result Value Ref Range   Glucose-Capillary 108 (H) 65 - 99 mg/dL  Glucose, capillary     Status: None   Collection Time: 09/04/16  4:19 PM  Result Value Ref Range   Glucose-Capillary 79 65 - 99 mg/dL  Glucose, capillary     Status: Abnormal   Collection Time: 09/04/16  8:43 PM  Result Value Ref Range   Glucose-Capillary 108 (H) 65 - 99 mg/dL  Glucose, capillary     Status: None   Collection Time: 09/05/16 12:34 AM  Result Value Ref Range   Glucose-Capillary 90 65 - 99 mg/dL  CBC     Status: Abnormal   Collection Time: 09/05/16  2:55  AM  Result Value Ref Range   WBC 11.3 (H) 4.0 - 10.5 K/uL   RBC 4.63 4.22 - 5.81 MIL/uL   Hemoglobin 12.3 (L) 13.0 - 17.0 g/dL   HCT 39.4 39.0 - 52.0 %   MCV 85.1 78.0 - 100.0 fL   MCH 26.6 26.0 - 34.0 pg   MCHC 31.2 30.0 - 36.0 g/dL   RDW 14.0 11.5 - 15.5 %   Platelets 243 150 - 400 K/uL  Basic metabolic panel     Status: Abnormal   Collection Time: 09/05/16  2:55 AM  Result Value Ref Range   Sodium 144 135 - 145 mmol/L   Potassium 3.2  (L) 3.5 - 5.1 mmol/L   Chloride 106 101 - 111 mmol/L   CO2 27 22 - 32 mmol/L   Glucose, Bld 103 (H) 65 - 99 mg/dL   BUN 21 (H) 6 - 20 mg/dL   Creatinine, Ser 1.14 0.61 - 1.24 mg/dL   Calcium 10.0 8.9 - 10.3 mg/dL   GFR calc non Af Amer >60 >60 mL/min   GFR calc Af Amer >60 >60 mL/min   Anion gap 11 5 - 15  Magnesium     Status: None   Collection Time: 09/05/16  2:55 AM  Result Value Ref Range   Magnesium 2.3 1.7 - 2.4 mg/dL  Phosphorus     Status: None   Collection Time: 09/05/16  2:55 AM  Result Value Ref Range   Phosphorus 2.7 2.5 - 4.6 mg/dL  Glucose, capillary     Status: Abnormal   Collection Time: 09/05/16  4:16 AM  Result Value Ref Range   Glucose-Capillary 104 (H) 65 - 99 mg/dL  Glucose, capillary     Status: None   Collection Time: 09/05/16  8:07 AM  Result Value Ref Range   Glucose-Capillary 97 65 - 99 mg/dL      Imaging Results (Last 48 hours)  Dg Chest Port 1 View  Result Date: 09/04/2016 CLINICAL DATA:  Chronic respiratory failure with hypoxia EXAM: PORTABLE CHEST 1 VIEW COMPARISON:  Chest x-ray of August 31, 2016 FINDINGS: The trachea and esophagus have been extubated. The left internal jugular venous catheter has been removed. The right lung is adequately inflated. On the left there is volume loss with obscuration of the hemidiaphragm and portions of the left heart border. The cardiac silhouette remains enlarged. The pulmonary vascularity remains engorged. The trachea is midline. The bony thorax exhibits no acute abnormality. There is calcification in the wall of the aortic arch. IMPRESSION: Improved appearance of the right lung with decreased interstitial edema. Persistent left basilar atelectasis or pneumonia with small left pleural effusion. Stable cardiomegaly. Thoracic aortic atherosclerosis. Electronically Signed   By: David  Martinique M.D.   On: 09/04/2016 07:13     Assessment/Plan: Diagnosis: Debilitation/cardiopulmonary arrest/anoxic  encephalopathy Labs and images independently reviewed.  Records reviewed and summated above.  1. Does the need for close, 24 hr/day medical supervision in concert with the patient's rehab needs make it unreasonable for this patient to be served in a less intensive setting? Yes  2. Co-Morbidities requiring supervision/potential complications: asthma (meds as necessary), diabetes mellitus (Monitor in accordance with exercise and adjust meds as necessary), gout (monitor for flares), combined CHF (Monitor in accordance with increased physical activity and avoid UE resistance excercises), tachypnea (monitor RR and O2 Sats with increased physical exertion), HTN (monitor and provide prns in accordance with increased physical exertion and pain), hypernatremia (cont to monitor, treat as necessary), hypokalemia (continue  to monitor and replete as necessary), leukocytosis (cont to monitor for signs and symptoms of infection, further workup if indicated), ABLA (transfuse if necessary to ensure appropriate perfusion for increased activity tolerance), morbid obesity (Body mass index is 43.03 kg/m.,  Diet and exercise education, encourage weight loss to increase endurance and promote overall health) 3. Due to bladder management, safety, disease management and patient education, does the patient require 24 hr/day rehab nursing? Yes 4. Does the patient require coordinated care of a physician, rehab nurse, PT (1-2 hrs/day, 5 days/week), OT (1-2 hrs/day, 51 days/week) and SLP (1-2 hrs/day, 5 days/week) to address physical and functional deficits in the context of the above medical diagnosis(es)? Yes Addressing deficits in the following areas: balance, endurance, locomotion, strength, transferring, bowel/bladder control, bathing, dressing, toileting, cognition and psychosocial support 5. Can the patient actively participate in an intensive therapy program of at least 3 hrs of therapy per day at least 5 days per week?  Potentially 6. The potential for patient to make measurable gains while on inpatient rehab is excellent 7. Anticipated functional outcomes upon discharge from inpatient rehab are modified independent and supervision  with PT, modified independent and supervision with OT, modified independent and supervision with SLP. 8. Estimated rehab length of stay to reach the above functional goals is: 11-15 days. 9. Does the patient have adequate social supports and living environment to accommodate these discharge functional goals? Potentially 10. Anticipated D/C setting: Home 11. Anticipated post D/C treatments: HH therapy and Home excercise program 12. Overall Rehab/Functional Prognosis: good  RECOMMENDATIONS: This patient's condition is appropriate for continued rehabilitative care in the following setting: Likely CIR, will need to await completion of medical workup.  Patient has agreed to participate in recommended program. Potentially Note that insurance prior authorization may be required for reimbursement for recommended care.  Comment: Rehab Admissions Coordinator to follow up.  Ankit Lorie Phenix, MD, Mellody Drown 09/05/2016

## 2016-09-16 ENCOUNTER — Inpatient Hospital Stay (HOSPITAL_COMMUNITY): Payer: Medicare Other

## 2016-09-16 ENCOUNTER — Inpatient Hospital Stay (HOSPITAL_COMMUNITY): Payer: Medicare Other | Admitting: Speech Pathology

## 2016-09-16 DIAGNOSIS — G931 Anoxic brain damage, not elsewhere classified: Secondary | ICD-10-CM

## 2016-09-16 DIAGNOSIS — R740 Nonspecific elevation of levels of transaminase and lactic acid dehydrogenase [LDH]: Secondary | ICD-10-CM

## 2016-09-16 DIAGNOSIS — E669 Obesity, unspecified: Secondary | ICD-10-CM

## 2016-09-16 DIAGNOSIS — I10 Essential (primary) hypertension: Secondary | ICD-10-CM

## 2016-09-16 DIAGNOSIS — I472 Ventricular tachycardia: Secondary | ICD-10-CM

## 2016-09-16 DIAGNOSIS — R7881 Bacteremia: Secondary | ICD-10-CM

## 2016-09-16 DIAGNOSIS — E119 Type 2 diabetes mellitus without complications: Secondary | ICD-10-CM

## 2016-09-16 DIAGNOSIS — D62 Acute posthemorrhagic anemia: Secondary | ICD-10-CM

## 2016-09-16 DIAGNOSIS — D72829 Elevated white blood cell count, unspecified: Secondary | ICD-10-CM

## 2016-09-16 DIAGNOSIS — E1169 Type 2 diabetes mellitus with other specified complication: Secondary | ICD-10-CM

## 2016-09-16 LAB — CBC WITH DIFFERENTIAL/PLATELET
Basophils Absolute: 0 10*3/uL (ref 0.0–0.1)
Basophils Relative: 0 %
Eosinophils Absolute: 0.4 10*3/uL (ref 0.0–0.7)
Eosinophils Relative: 3 %
HCT: 35 % — ABNORMAL LOW (ref 39.0–52.0)
Hemoglobin: 11.1 g/dL — ABNORMAL LOW (ref 13.0–17.0)
Lymphocytes Relative: 21 %
Lymphs Abs: 2.5 10*3/uL (ref 0.7–4.0)
MCH: 26.2 pg (ref 26.0–34.0)
MCHC: 31.7 g/dL (ref 30.0–36.0)
MCV: 82.7 fL (ref 78.0–100.0)
Monocytes Absolute: 1 10*3/uL (ref 0.1–1.0)
Monocytes Relative: 8 %
Neutro Abs: 8 10*3/uL — ABNORMAL HIGH (ref 1.7–7.7)
Neutrophils Relative %: 68 %
Platelets: 330 10*3/uL (ref 150–400)
RBC: 4.23 MIL/uL (ref 4.22–5.81)
RDW: 14 % (ref 11.5–15.5)
WBC: 11.8 10*3/uL — ABNORMAL HIGH (ref 4.0–10.5)

## 2016-09-16 LAB — COMPREHENSIVE METABOLIC PANEL
ALT: 129 U/L — ABNORMAL HIGH (ref 17–63)
AST: 80 U/L — ABNORMAL HIGH (ref 15–41)
Albumin: 2.8 g/dL — ABNORMAL LOW (ref 3.5–5.0)
Alkaline Phosphatase: 92 U/L (ref 38–126)
Anion gap: 10 (ref 5–15)
BUN: 9 mg/dL (ref 6–20)
CO2: 25 mmol/L (ref 22–32)
Calcium: 9.9 mg/dL (ref 8.9–10.3)
Chloride: 104 mmol/L (ref 101–111)
Creatinine, Ser: 1.05 mg/dL (ref 0.61–1.24)
GFR calc Af Amer: >60 mL/min (ref >60)
GFR calc non Af Amer: >60 mL/min (ref >60)
Glucose, Bld: 102 mg/dL — ABNORMAL HIGH (ref 65–99)
Potassium: 4.2 mmol/L (ref 3.5–5.1)
Sodium: 139 mmol/L (ref 135–145)
Total Bilirubin: 0.8 mg/dL (ref 0.3–1.2)
Total Protein: 6.8 g/dL (ref 6.5–8.1)

## 2016-09-16 LAB — GLUCOSE, CAPILLARY
Glucose-Capillary: 110 mg/dL — ABNORMAL HIGH (ref 65–99)
Glucose-Capillary: 106 mg/dL — ABNORMAL HIGH (ref 65–99)
Glucose-Capillary: 97 mg/dL (ref 65–99)
Glucose-Capillary: 114 mg/dL — ABNORMAL HIGH (ref 65–99)

## 2016-09-16 NOTE — Progress Notes (Addendum)
[facility]ICINE & REHABILITATION     PROGRESS NOTE  Subjective/Complaints:  Pt sitting up in bed about to eat breakfast.  He states he had a long night and is ready for therapy to begin.    ROS: Denies CP, SOB, N/V/D.  Objective: Vital Signs: Blood pressure (!) 160/55, pulse (!) 59, temperature 98 F (36.7 C), temperature source Oral, resp. rate 18, weight 114 kg (251 lb 4 oz), SpO2 99 %. No results found.  Recent Labs  09/14/16 0444 09/16/16 0537  WBC 10.0 11.8*  HGB 10.9* 11.1*  HCT 34.5* 35.0*  PLT 289 330    Recent Labs  09/14/16 0444 09/16/16 0537  NA 138 139  K 3.9 4.2  CL 106 104  GLUCOSE 113* 102*  BUN 13 9  CREATININE 1.09 1.05  CALCIUM 9.5 9.9   CBG (last 3)   Recent Labs  09/15/16 1128 09/15/16 2027 09/16/16 0635  GLUCAP 126* 117* 110*    Wt Readings from Last 3 Encounters:  09/16/16 114 kg (251 lb 4 oz)  09/15/16 116.6 kg (257 lb 1.6 oz)  03/02/14 133.8 kg (295 lb)    Physical Exam:  BP (!) 160/55 (BP Location: Right Arm)   Pulse (!) 59   Temp 98 F (36.7 C) (Oral)   Resp 18   Wt 114 kg (251 lb 4 oz)   SpO2 99%   BMI 40.55 kg/m  Constitutional: He appears well-developed and well-nourished. Obese. NAD.  HENT: Normocephalic and atraumatic.  Eyes: EOM are normal. No discharge. Cardiovascular: Normal rate and regular rhythm.  No JVD. Respiratory: Effort normal and breath sounds normal.  GI: Soft. Bowel sounds are normal.  Musculoskeletal: He exhibits edema (min edema BLE). He exhibits no tenderness.  Neurological: He is alert and oriented.  Motor: 4+-5/5 throughout Sensation intact to light touch  Skin: Skin is warm and dry. No erythema.  Psychiatric: He has a normal mood and affect. His behavior is normal.   Assessment/Plan: 1. Functional deficits secondary to debility/acute encephalopathy which require 3+ hours per day of interdisciplinary therapy in a comprehensive inpatient rehab setting. Physiatrist is providing  close team supervision and 24 hour management of active medical problems listed below. Physiatrist and rehab team continue to assess barriers to discharge/monitor patient progress toward functional and medical goals.  Function:  Bathing Bathing position      Bathing parts      Bathing assist        Upper Body Dressing/Undressing Upper body dressing                    Upper body assist        Lower Body Dressing/Undressing Lower body dressing                                  Lower body assist        Toileting Toileting          Toileting assist     Transfers Chair/bed transfer             Locomotion Ambulation           Wheelchair          Cognition Comprehension    Expression    Social Interaction    Problem Solving    Memory       Medical Problem List and Plan: 1.  Poor endurance and safety awareness  secondary to debility/acute encephalopathy.  Begin CIR 2.  DVT Prophylaxis/Anticoagulation: Pharmaceutical: Lovenox 3. Pain Management: N/A 4. Mood: Some anxiety noted--team to provide ego support. LCSW to follow for evaluation and support.  5. Neuropsych: This patient is not fully capable of making decisions on  his own behalf. 6. Skin/Wound Care: routine pressure relief measures  7. Fluids/Electrolytes/Nutrition: Monitor I/O. Supplements prn if intake poor.  8. NSVT: LifeVest in place.   Decrease amiodarone to 200 mg daily in a week (12/28).   ASA resumed.  Follow up with Dr. Curt Bears after discharge.  9. 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 Continue lasix, ace and BB.  Filed Weights   09/16/16 0500  Weight: 114 kg (251 lb 4 oz)   10 HTN: Monitor BID. Continue coreg, lasix and Prinivil. Monitor renal status and electrolytes closely.   Monitor with increased mobility 11. Hypokalemia: Monitor lytes daily for now. EP recommends K+./=4.0 and Mg >/+2.0  K+ 4.2 on 12/23 12.  Diabetes: Hgb A1c-  5.5. Monitor BS ac/hs.  13. OSA: does not use CPAP consistently at home.   Encourage compliance.  14. Citrobacter Bacteremia: Antibiotic day # 6/14 15. Transaminitis: Question due to shocked liver  Medication history reviewed and discussed with pharmacy, no obvious new offending agents  Will cont to monitor 16. ABLA:   Hb 11.1 on 12/23  Cont to monitor 17. Leukocytosis  WBCs 11.8 on 12/23  Cont to monitor  LOS (Days) 1 A FACE TO FACE EVALUATION WAS PERFORMED  Dean [name] Lorie Phenix 09/16/2016 8:03 AM

## 2016-09-16 NOTE — Evaluation (Signed)
Physical Therapy Assessment and Plan  Patient Details  Name: Dean [name] MRN: [id number] Date of Birth: 1951-04-24  PT Diagnosis: Abnormality of gait, Cognitive deficits and Edema Rehab Potential: Good ELOS: 3-5   Today's Date: 09/16/2016 PT Individual Time: 9480-1655 PT Individual Time Calculation (min): 62 min     Problem List: Patient Active Problem List   Diagnosis Date Noted  . Bacteremia   . Anoxic brain injury ([place]) 09/15/2016  . Benign essential HTN   . OSA (obstructive sleep apnea)   . Transaminitis   . Acute lower UTI   . Bacteremia due to Gram-negative bacteria   . Chronic respiratory failure with hypoxia ([place])   . Fever   . Acute cystitis without hematuria   . NSVT (nonsustained ventricular tachycardia) ([place])   . Uncomplicated asthma   . Diabetes mellitus type 2 in obese ([place])   . Acute on chronic combined systolic and diastolic CHF (congestive heart failure) ([place])   . Tachypnea   . Hypernatremia   . Hypokalemia   . Leukocytosis   . Acute blood loss anemia   . Hypoxemia   . Acute respiratory failure ([place])   . Acute encephalopathy   . Cardiopulmonary arrest ([place]) 08/27/2016  . DM2 (diabetes mellitus, type 2) ([place]) 03/02/2014  . Morbid obesity ([place]) 03/02/2014  . Essential hypertension, benign 03/02/2014    Past Medical History:  Past Medical History:  Diagnosis Date  . Arthritis    "maybe in his spine" (09/05/2016)  . Asthma   . Cardiac arrest ([place]) 08/27/2016   REQUIRING CPR, SHOCK & MEDICATIONS  . Gout   . High cholesterol   . Hypertension   . OSA on CPAP   . Type II diabetes mellitus ([place])    Past Surgical History:  Past Surgical History:  Procedure Laterality Date  . CARDIAC CATHETERIZATION N/A 09/07/2016   Procedure: Left Heart Cath and Coronary Angiography;  Surgeon: Peter M Martinique, MD;  Location: [facility];  Service: Cardiovascular;  Laterality: N/A;  . NO PAST SURGERIES      Assessment & Plan Clinical Impression:  Dean [name] a 65 y.o.with history of asthma, T2DM, asthma, gout, HTN who was admitted on 08/27/2016 after he collapsed walking back from the bathroom. CPR not performed by family and EMS arrived 10 minutes later, performedCPR 21 minutes with 7 rounds of epi, 5 shocks,amiodarone,and magnesium given. He coded again en route to the hospital and Cranial CT scan negative. He had episode of NSVT in CT scanner and treated with amiodarone. EKG with inferior T wave inversions and was treated with [employer] protocol and elevated troponin felt to be due to shock applied for VT/VF.   Echocardiogram with severe LVH, EF 35-40%, akinesis of the basal inferior and apical myocardium, mild AVR and MVR. Resuscitation time approximately  21 minutes and neurology consulted due to concerns of seizures as well as anxoic injury. EEG with diffuse cerebral dysfunction but no seizures and Dr. Leonel Ramsay recommended supportive care for management of anoxic encephalopathy. He tolerate extubation on 12/7 and has continued to have bouts of confusion with agitation.   Cardiac cath 12/14 revealed 90% ostial stenosis distal R-RCA with left to right collaterals and 100% stenosis mid RCA to distal RCA lesion. Medical therapy recommended with consideration of  PCI if patient develops angina. On amiodarone for NSVT and ICD placement delayed due to Citrobacter Koseri bacteremia--LiveVest ordered by Dr. Curt Bears. IV Vanc/Zosyn transitioned to Rocephin with 2 week antibiotic therapy recommended per discussion with ID.  He continues to have recurrent hypokalemia requiring supplementation--needs to keep K > 4.0 and Mg > 2.0.  Being loaded with amiodarone and to decrease to 200 mg daily (on 12/28). Therapy ongoing and patient with cognitive deficits, poor safety awareness and unsteady gait. CIR recommended for follow up therapy. Admitted yesterday evening.    Patient transferred to CIR on 09/15/2016 .   Patient currently requires min with  mobility secondary to decreased activity tolerance, (probable)  higher level balance deficits- to be further assessed and . poor understanding of Life vest ( difficulty maintaining precautions),   Prior to hospitalization, patient was independent  with mobility and lived with Spouse, Son (91 y/o son with down syndrome) in a House home.  Home access is  Level entry, and 13 steps to bedroom on 2nd level.  Pt has bathroom on entry level.  Patient will benefit from skilled PT intervention to maximize safe functional mobility, minimize fall risk and decrease caregiver burden for planned discharge home with 24 hour supervision.  Anticipate patient will benefit from follow up OP at discharge; Cardiac Rehab when appropriate.   PT - End of Session Activity Tolerance: Tolerates 30+ min activity with multiple rests Endurance Deficit: Yes Endurance Deficit Description: pt stated he feels "odd in the head" from all of his meds PT Assessment Rehab Potential (ACUTE/IP ONLY): Good PT Patient demonstrates impairments in the following area(s): Balance;Endurance;Edema;Safety PT Transfers Functional Problem(s): Bed Mobility;Bed to Chair;Car;Furniture PT Locomotion Functional Problem(s): Ambulation;Stairs PT Plan PT Intensity: Minimum of 1-2 x/day ,45 to 90 minutes PT Frequency: 5 out of 7 days PT Duration Estimated Length of Stay: 3-5 PT Treatment/Interventions: Ambulation/gait training;Balance/vestibular training;Cognitive remediation/compensation;Discharge planning;Community reintegration;DME/adaptive equipment instruction;Functional mobility training;Patient/family education;Pain management;Neuromuscular re-education;Psychosocial support;Splinting/orthotics;Therapeutic Exercise;Therapeutic Activities;Stair training;UE/LE Strength taining/ROM;UE/LE Coordination activities;Wheelchair propulsion/positioning PT Transfers Anticipated Outcome(s): modifiied indpependent basic; supervision car PT Locomotion Anticipated  Outcome(s): supervision gait x 150' controlled, and 50' home environment; up/down 12 steps 2 rails PT Recommendation Follow Up Recommendations: Outpatient PT (cardiac rehab when appropriate) Patient destination: Home Equipment Recommended: To be determined  Skilled Therapeutic Intervention- pt initially reluctant to participate, stating his head "felt bad from all this medicine; how can you expect me to do all kinds of stuff? "  Pt unable to state situation that brought him to Rehab.  Caryl Pina, RN informed; pt educated on purpose of rehab and plan.   Pt more receptive and was then compliant with all aspects of eval.  Pt stated he did not have to use toilet.  He had brief on.  Pt instructed in diaphragmatic breathing during session.  neuromuscular re-education via demo, VCs for bil bridging in supine focusing on trunk stability, as pelvis wobbled with elevation; VCs for counting aloud as pt appeared to be holding his breath. VCs for safety with w/c during session.  Pt was not impulsive. Pt left resting in recliner at end of session, fatigued. Caryl Pina, RN and NT informed pt needs 2 quick release belts on him when in recliner for safety.  Pt noted to smell like urine, and both socks and lower edges of pajama pants were wet. He did not notice that weight of wet pants was pulling them down, under his feet during gait, a safety concern.  Caryl Pina, RN informed of wet clothing.   PT Evaluation Precautions/Restrictions Precautions Precautions: Fall;Other (comment) Precaution Comments: on lIfe Vest with cell phone Restrictions Weight Bearing Restrictions: No General   Vital SignsTherapy Vitals Temp: 98 F (36.7 C) Temp Source: Oral Pulse Rate: 65 Resp: 18 BP: [name])  148/57 Patient Position (if appropriate): Lying Oxygen Therapy SpO2: 99 % O2 Device: Not Delivered Pain Pain Assessment Pain Assessment: No/denies pain Home Living/Prior Functioning Home Living Available Help at Discharge: Family Type of  Home: House Home Access: Level entry Home Layout: Multi-level Bathroom Shower/Tub: Product/process development scientist: Standard Bathroom Accessibility: Yes Additional Comments: Pt states he has 5 children; only Philmore Pali lives at home  Lives With: Spouse;Son (77 y/o son with down syndrome) Prior Function Level of Independence: Independent with basic ADLs  Able to Take Stairs?: Yes Driving: Yes Vocation: Self employed Vocation Requirements: Climb ladders, accesses crawl spaces; realistic and plans to use paid help  Comments: Pt started career as Horticulturist, 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 Vision/Perception     Cognition Overall Cognitive Status: Impaired/Different from baseline Arousal/Alertness: Awake/alert Attention: Selective Selective Attention: Appears intact Memory: Appears intact Awareness: Appears intact Problem Solving: Appears intact Safety/Judgment: Appears intact Sensation Sensation Light Touch: Appears Intact Proprioception: Appears Intact Coordination Heel Shin Test: WNLs+ Motor  Motor Motor: Within Functional Limits  Mobility Bed Mobility Bed Mobility: Rolling Right;Rolling Left;Left Sidelying to Sit;Supine to Sit Rolling Right: 6: Modified independent (Device/Increase time) Rolling Left: 6: Modified independent (Device/Increase time) Left Sidelying to Sit: 5: Supervision Supine to Sit: 5: Supervision (effortful) Transfers Transfers: Yes Stand Pivot Transfers: 4: Min guard Locomotion  Ambulation Ambulation: Yes Ambulation/Gait Assistance: 4: Min guard Ambulation Distance (Feet): 150 Feet Assistive device: None Gait Gait: Yes Gait Pattern: Impaired Gait Pattern: Wide base of support;Decreased trunk rotation Gait velocity: decreased for age and gender Stairs / Additional Locomotion Stairs: Yes Stairs Assistance: 4: Min guard Stair Management Technique: Two rails Number of Stairs: 12 Ramp: 4: Min assist Curb:  4: Min Administrator Mobility: No  Trunk/Postural Assessment  Cervical Assessment Cervical Assessment: Within Functional Limits Thoracic Assessment Thoracic Assessment: Within Functional Limits Lumbar Assessment Lumbar Assessment: Within Functional Limits Postural Control Postural Control: Within Functional Limits  Balance Balance Balance Assessed: Yes Static Sitting Balance Static Sitting - Level of Assistance: 7: Independent Dynamic Sitting Balance Dynamic Sitting - Level of Assistance: 5: Stand by assistance Static Standing Balance Static Standing - Level of Assistance: 5: Stand by assistance Dynamic Standing Balance Dynamic Standing - Level of Assistance: 5: Stand by assistance Extremity Assessment      RLE Assessment RLE Assessment: Within Functional Limits (min edema, non pitting) LLE Assessment LLE Assessment: Within Functional Limits (min edema, non putting)   See Function Navigator for Current Functional Status.   Refer to Care Plan for Long Term Goals  Recommendations for other services: None   Discharge Criteria: Patient will be discharged from PT if patient refuses treatment 3 consecutive times without medical reason, if treatment goals not met, if there is a change in medical status, if patient makes no progress towards goals or if patient is discharged from hospital.  The above assessment, treatment plan, treatment alternatives and goals were discussed and mutually agreed upon: by patient  Ladelle Teodoro 09/16/2016, 4:47 PM

## 2016-09-16 NOTE — H&P (Signed)
Physical Medicine and Rehabilitation Admission H&P    Chief Complaint  Patient presents with  . Anoxic brain injury    HPI:   Dean [name] a 65 y.o.with history of asthma, T2DM, asthma, gout, HTN who was admitted on 08/27/2016 after he collapsed walking back from the bathroom. CPR not performed by family and EMS arrived 10 minutes later, performedCPR 21 minutes with 7 rounds of epi, 5 shocks,amiodarone,and magnesium given. He coded again en route to the hospital and Cranial CT scan negative. He had episode of NSVT in CT scanner and treated with amiodarone. EKG with inferior T wave inversions and was treated with [employer] protocol and elevated troponin felt to be due to shock applied for VT/VF.   Echocardiogram with severe LVH, EF 35-40%, akinesis of the basal inferior and apical myocardium, mild AVR and MVR. Resuscitation time approximately  21 minutes and neurology consulted due to concerns of seizures as well as anxoic injury. EEG with diffuse cerebral dysfunction but no seizures and Dr. Leonel Ramsay recommended supportive care for management of anoxic encephalopathy. He tolerate extubation on 12/7 and has continued to have bouts of confusion with agitation.   Cardiac cath 12/14 revealed 90% ostial stenosis distal R-RCA with left to right collaterals and 100% stenosis mid RCA to distal RCA lesion. Medical therapy recommended with consideration of  PCI if patient develops angina. On amiodarone for NSVT and ICD placement delayed due to Citrobacter Koseri bacteremia--LiveVest ordered by Dr. Curt Bears. IV Vanc/Zosyn transitioned to Rocephin with 2 week antibiotic therapy recommended per discussion with ID.  He continues to have recurrent hypokalemia requiring supplementation--needs to keep K > 4.0 and Mg > 2.0.  Being loaded with amiodarone and to decrease to 200 mg daily (on 12/28). Therapy ongoing and patient with cognitive deficits, poor safety awareness and unsteady gait. CIR recommended  for follow up therapy. Admitted yesterday evening.     Review of Systems  HENT: Negative for hearing loss and tinnitus.   Eyes: Negative for blurred vision and double vision.  Respiratory: Positive for cough. Negative for shortness of breath.   Cardiovascular: Positive for leg swelling (tends to swell). Negative for chest pain and palpitations.  Gastrointestinal: Negative for constipation, heartburn and nausea.  Genitourinary: Negative for dysuria and urgency.  Musculoskeletal: Negative for back pain, joint pain and myalgias.  Skin: Negative for itching and rash.  Neurological: Negative for dizziness, speech change and focal weakness.  Psychiatric/Behavioral: Positive for memory loss. Negative for depression.  All other systems reviewed and are negative.     Past Medical History:  Diagnosis Date  . Arthritis    "maybe in his spine" (09/05/2016)  . Asthma   . Cardiac arrest ([place]) 08/27/2016   REQUIRING CPR, SHOCK & MEDICATIONS  . Gout   . High cholesterol   . Hypertension   . OSA on CPAP   . Type II diabetes mellitus ([place])     Past Surgical History:  Procedure Laterality Date  . CARDIAC CATHETERIZATION N/A 09/07/2016   Procedure: Left Heart Cath and Coronary Angiography;  Surgeon: Peter M Martinique, MD;  Location: [place];  Service: Cardiovascular;  Laterality: N/A;  . NO PAST SURGERIES      Family History  Problem Relation Age of Onset  . Heart attack Father     died in his 75's  . Hypertension Sister   . Cancer Brother     uncertain, "leg"  . Hypertension Sister     Social History:  Married--wife retired.  Works in Architect. He  reports that he has never smoked. He has never used smokeless tobacco. He reports that he does not drink alcohol or use drugs.    Allergies: No Known Allergies    Medications Prior to Admission  Medication Sig Dispense Refill  . allopurinol (ZYLOPRIM) 300 MG tablet Take 300 mg by mouth daily.    [name] amLODipine (NORVASC) 10  MG tablet Take 10 mg by mouth daily.    . cloNIDine (CATAPRES) 0.2 MG tablet Take 0.2 mg by mouth daily.    . CVS VITAMIN D3 1000 units capsule Take 1,000 Units by mouth daily.  5  . furosemide (LASIX) 40 MG tablet Take 40 mg by mouth daily.    . Ipratropium-Albuterol (COMBIVENT RESPIMAT) 20-100 MCG/ACT AERS respimat Inhale 1 puff into the lungs every 6 (six) hours as needed for wheezing or shortness of breath.    . isosorbide-hydrALAZINE (BIDIL) 20-37.5 MG tablet Take 1 tablet by mouth 3 (three) times daily.    . metFORMIN (GLUCOPHAGE) 500 MG tablet Take 500 mg by mouth 2 (two) times daily with a meal.     . metoprolol (TOPROL-XL) 200 MG 24 hr tablet Take 300 mg by mouth daily.    [name] PRESCRIPTION MEDICATION Inhale into the lungs at bedtime. CPAP    . rosuvastatin (CRESTOR) 20 MG tablet Take 20 mg by mouth daily.    [name] spironolactone (ALDACTONE) 50 MG tablet Take 50 mg by mouth daily.      Home: Home Living Family/patient expects to be discharged to:: Private residence Living Arrangements: Spouse/significant other, Children Available Help at Discharge: Family Home Access: Level entry [place]: One [place]: None Additional Comments: Pt unable to provide accurate history.   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, commercial - no family present to confirm   Functional Status:  Mobility: Bed Mobility Overal bed mobility: Independent Bed Mobility: Supine to Sit, Sit to Supine Supine to sit: Min guard, HOB elevated Sit to supine: Min guard General bed mobility comments: pt sitting OOB in recliner when PT entered room and sitting EOB at end of session Transfers Overall transfer level: Needs assistance Equipment used: Rolling walker (2 wheeled) Transfers: Sit to/from Stand Sit to Stand: Min guard Stand pivot  transfers: Min assist General transfer comment: VCs for safe hand placement and positioning prior to elevation to upright, min guard for safety Ambulation/Gait Ambulation/Gait assistance: Min guard Ambulation Distance (Feet): 200 Feet Assistive device: Rolling walker (2 wheeled) Gait Pattern/deviations: Step-through pattern, Decreased stride length, Drifts right/left General Gait Details: pt with min instability during ambulation, no physical assistance needed; however, pt frequently drifting R/L and grazing objects with his RW Gait velocity: decreased Gait velocity interpretation: Below normal speed for age/gender    ADL: ADL Overall ADL's : Needs assistance/impaired Eating/Feeding: Independent Grooming: Wash/dry hands, Min guard, Standing, Wash/dry face Upper Body Bathing: Min guard, Sitting (simulated) Upper Body Bathing Details (indicate cue type and reason): for thoroughness  Lower Body Bathing: Moderate assistance, Sit to/from stand, Minimal assistance, Sitting/lateral leans (simulated) Upper Body Dressing : Min guard, Standing Lower Body Dressing: Min guard Lower Body Dressing Details (indicate cue type and reason): donned socks sitting EOB, required increased time and effort Toilet Transfer: Min guard, Ambulation, RW, Grab bars, Cueing for Office manager Details (indicate  cue type and reason): recliner>out door and back into room>bed Toileting- Water quality scientist and Hygiene: Sit to/from stand, Minimal assistance Toileting - Clothing Manipulation Details (indicate cue type and reason): pt incontinent of stool.  Assisted with peri care  Tub/ Shower Transfer: Min guard, Grab bars, Ambulation, Rolling walker Functional mobility during ADLs: Rolling walker, Min guard, Cueing for safety  Cognition: Cognition Overall Cognitive Status: Impaired/Different from baseline Orientation Level: Oriented to person, Oriented to place Cognition Arousal/Alertness:  Awake/alert Behavior During Therapy: WFL for tasks assessed/performed Overall Cognitive Status: Impaired/Different from baseline Area of Impairment: Safety/judgement Orientation Level: Disoriented to, Time Current Attention Level: Sustained Memory: Decreased short-term memory Following Commands: Follows one step commands consistently Safety/Judgement: Decreased awareness of safety Awareness: Intellectual Problem Solving: Slow processing, Difficulty sequencing, Requires verbal cues, Requires tactile cues General Comments: Asked him if he had family here in [name] and he said yes his mom and dad so I asked him if they were in their 70's and he said no they were deceased. Asked him how many brother and sisters he had and it too him increased time to tell me the number, when asked where he fell in the mix of 10 children, he tried to tell me but could not come up with the answer. He did now it was Dec and 2017.    Blood pressure (!) 141/48, pulse (!) 58, temperature 97.4 F (36.3 C), temperature source Oral, resp. rate 18, height '5\' 6"'  (1.676 m), weight 116.6 kg (257 lb 1.6 oz), SpO2 100 %. Physical Exam  Nursing note and vitals reviewed. Constitutional: He is oriented to person, place, and time. He appears well-developed and well-nourished.  Obese  HENT:  Head: Normocephalic and atraumatic.  Eyes: Conjunctivae and EOM are normal.  Neck: Normal range of motion. Neck supple.  Cardiovascular: Normal rate and regular rhythm.   Respiratory: Effort normal and breath sounds normal. No stridor.  Congested cough occasionally.  GI: Soft. Bowel sounds are normal. He exhibits no distension. There is no tenderness.  Musculoskeletal: He exhibits edema (min edema BLE). He exhibits no tenderness.  Neurological: He is alert and oriented to person, place, and time.  Motor: 4+-5/5 throughout Sensation intact to light touch  Skin: Skin is warm and dry. No erythema.  Psychiatric: He has a normal mood  and affect. His behavior is normal.    Results for orders placed or performed during the hospital encounter of 08/27/16 (from the past 48 hour(s))  Glucose, capillary     Status: Abnormal   Collection Time: 09/13/16  3:27 PM  Result Value Ref Range   Glucose-Capillary 101 (H) 65 - 99 mg/dL   Comment 1 Notify RN    Comment 2 Document in Chart   Glucose, capillary     Status: Abnormal   Collection Time: 09/13/16  9:35 PM  Result Value Ref Range   Glucose-Capillary 101 (H) 65 - 99 mg/dL   Comment 1 Notify RN    Comment 2 Document in Chart   Procalcitonin     Status: None   Collection Time: 09/14/16  4:44 AM  Result Value Ref Range   Procalcitonin 0.38 ng/mL    Comment:        Interpretation: PCT (Procalcitonin) <= 0.5 ng/mL: Systemic infection (sepsis) is not likely. Local bacterial infection is possible. (NOTE)         ICU PCT Algorithm               Non ICU PCT Algorithm    ----------------------------     ------------------------------  PCT < 0.25 ng/mL                 PCT < 0.1 ng/mL     Stopping of antibiotics            Stopping of antibiotics       strongly encouraged.               strongly encouraged.    ----------------------------     ------------------------------       PCT level decrease by               PCT < 0.25 ng/mL       >= 80% from peak PCT       OR PCT 0.25 - 0.5 ng/mL          Stopping of antibiotics                                             encouraged.     Stopping of antibiotics           encouraged.    ----------------------------     ------------------------------       PCT level decrease by              PCT >= 0.25 ng/mL       < 80% from peak PCT        AND PCT >= 0.5 ng/mL            Continuin g antibiotics                                              encouraged.       Continuing antibiotics            encouraged.    ----------------------------     ------------------------------     PCT level increase compared          PCT > 0.5  ng/mL         with peak PCT AND          PCT >= 0.5 ng/mL             Escalation of antibiotics                                          strongly encouraged.      Escalation of antibiotics        strongly encouraged.   CBC     Status: Abnormal   Collection Time: 09/14/16  4:44 AM  Result Value Ref Range   WBC 10.0 4.0 - 10.5 K/uL   RBC 4.15 (L) 4.22 - 5.81 MIL/uL   Hemoglobin 10.9 (L) 13.0 - 17.0 g/dL   HCT 34.5 (L) 39.0 - 52.0 %   MCV 83.1 78.0 - 100.0 fL   MCH 26.3 26.0 - 34.0 pg   MCHC 31.6 30.0 - 36.0 g/dL   RDW 13.9 11.5 - 15.5 %   Platelets 289 150 - 400 K/uL  Basic metabolic panel     Status: Abnormal   Collection Time: 09/14/16  4:44 AM  Result Value Ref Range   Sodium 138 135 -  145 mmol/L   Potassium 3.9 3.5 - 5.1 mmol/L   Chloride 106 101 - 111 mmol/L   CO2 24 22 - 32 mmol/L   Glucose, Bld 113 (H) 65 - 99 mg/dL   BUN 13 6 - 20 mg/dL   Creatinine, Ser 1.09 0.61 - 1.24 mg/dL   Calcium 9.5 8.9 - 10.3 mg/dL   GFR calc non Af Amer >60 >60 mL/min   GFR calc Af Amer >60 >60 mL/min    Comment: (NOTE) The eGFR has been calculated using the CKD EPI equation. This calculation has not been validated in all clinical situations. eGFR's persistently <60 mL/min signify possible Chronic Kidney Disease.    Anion gap 8 5 - 15  Glucose, capillary     Status: Abnormal   Collection Time: 09/14/16  6:10 AM  Result Value Ref Range   Glucose-Capillary 107 (H) 65 - 99 mg/dL   Comment 1 Notify RN    Comment 2 Document in Chart   Glucose, capillary     Status: Abnormal   Collection Time: 09/14/16 11:52 AM  Result Value Ref Range   Glucose-Capillary 114 (H) 65 - 99 mg/dL  Glucose, capillary     Status: Abnormal   Collection Time: 09/14/16  4:13 PM  Result Value Ref Range   Glucose-Capillary 112 (H) 65 - 99 mg/dL  Glucose, capillary     Status: Abnormal   Collection Time: 09/14/16  9:07 PM  Result Value Ref Range   Glucose-Capillary 114 (H) 65 - 99 mg/dL  Glucose, capillary      Status: Abnormal   Collection Time: 09/15/16  5:53 AM  Result Value Ref Range   Glucose-Capillary 105 (H) 65 - 99 mg/dL  Glucose, capillary     Status: Abnormal   Collection Time: 09/15/16 11:28 AM  Result Value Ref Range   Glucose-Capillary 126 (H) 65 - 99 mg/dL   Comment 1 Document in Chart    No results found.     Medical Problem List and Plan: 1.  Poor endurance and safety awareness secondary to debility/acute encephalopathy. 2.  DVT Prophylaxis/Anticoagulation: Pharmaceutical: Lovenox 3. Pain Management: N/A 4. Mood: Some anxiety noted--team to provide ego support. LCSW to follow for evaluation and support.  5. Neuropsych: This patient is not fully capable of making decisions on  his own behalf. 6. Skin/Wound Care: routine pressure relief measures  7. Fluids/Electrolytes/Nutrition: Monitor I/O. Supplements prn if intake poor.  8. NSVT: LifeVest in place. To decrease amiodarone to 200 mg daily in a week. Follow up with Dr. Curt Bears after discharge. ASA resumed. 9. 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 Continue lasix, ace 1 and BB.  10 HTN: Monitor BID. Continue coreg, lasix and Prinivil. Monitor renal status and electrolytes closely.  11. Hypokalemia: Monitor lytes daily for now. EP recommends K+./=4.0 and Mg >/+2.0 12.  Diabetes: Hgb A1c- 5.5. Monitor BS ac/hs.  13. OSA: does not use CPAP consistently at home. Will encourage compliance.  14. Citrobacter Bacteremia: Antibiotic day # 5/14 15. Abnormal LFTs: Question due to shocked liver--recheck in am.  16. ABLA: Follow CBC  Post Admission Physician Evaluation: 1. Preadmission assessment reviewed and changes made below. 2. Functional deficits secondary  to debility/acute encephalopathy.. 3. Patient is admitted to receive collaborative, interdisciplinary care between the physiatrist, rehab nursing staff, and therapy team. 4. Patient's level of medical complexity and substantial therapy needs in  context of that medical necessity cannot be provided at a lesser intensity of care such as a SNF. 5.  Patient has experienced substantial functional loss from his/her baseline which was documented above under the "Functional History" and "Functional Status" headings.  Judging by the patient's diagnosis, physical exam, and functional history, the patient has potential for functional progress which will result in measurable gains while on inpatient rehab.  These gains will be of substantial and practical use upon discharge  in facilitating mobility and self-care at the household level. 6. Physiatrist will provide 24 hour management of medical needs as well as oversight of the therapy plan/treatment and provide guidance as appropriate regarding the interaction of the two. 7. The Preadmission Screening has been reviewed and patient status is unchanged unless otherwise stated above. 8. 24 hour rehab nursing will assist with safety, disease management and patient education  and help integrate therapy concepts, techniques,education, etc. 9. PT will assess and treat for/with: Lower extremity strength, range of motion, stamina, balance, functional mobility, safety, adaptive techniques and equipment, coping skills, pain control, education.   Goals are: Mod I. 10. OT will assess and treat for/with: ADL's, functional mobility, safety, upper extremity strength, adaptive techniques and equipment, ego support, and community reintegration.   Goals are: Mod I. Therapy may proceed with showering this patient. 11. SLP will assess and treat for cognitive functioning.  Goals: Supervision/Mod I 12. Case Management and Social Worker will assess and treat for psychological issues and discharge planning. 74. Team conference will be held weekly to assess progress toward goals and to determine barriers to discharge. 14. Patient will receive at least 3 hours of therapy per day at least 5 days per week. 15. ELOS: 4-6 days.        16. Prognosis:  excellent and good     Delice Lesch, MD, [address] [place] 09/15/2016

## 2016-09-16 NOTE — Plan of Care (Signed)
Problem: RH SAFETY Goal: RH STG ADHERE TO SAFETY PRECAUTIONS W/ASSISTANCE/DEVICE STG Adhere to Safety Precautions With supervision.   Outcome: Not Progressing Patient getting up OOB byself-use Bed alarm

## 2016-09-16 NOTE — Progress Notes (Signed)
Patient refused CPAP for the night  

## 2016-09-16 NOTE — IPOC Note (Addendum)
Overall Plan of Care [facility]) Patient Details Name: Dellas Masiello MRN: [id number] DOB: [birth year]  Admitting Diagnosis: Time Hospital Problems: Active Problems:   Anoxic brain injury ([place])   Bacteremia   Acute systolic congestive heart failure ([place])   Disorientated   AKI (acute kidney injury) ([place])     Functional Problem List: Nursing Endurance, Nutrition, Motor, Safety  PT Balance, Endurance, Edema, Safety  OT Cognition, Endurance, Balance  SLP Cognition  TR         Basic ADL's: OT Bathing, Dressing, Toileting     Advanced  ADL's: OT Other (comment)     Transfers: PT Bed Mobility, Bed to Chair, Car, Manufacturing systems engineer, Metallurgist: PT Ambulation, Stairs     Additional Impairments: OT None  SLP Social Cognition   Problem Solving, Memory, Attention, Awareness  TR      Anticipated Outcomes Item Anticipated Outcome  Self Feeding Mod I  Swallowing      Basic self-care  Mod I  Toileting  Mod I   Bathroom Transfers Supervision  Bowel/Bladder  Mod I  Transfers  modifiied indpependent basic; supervision car  Locomotion  supervision gait x 150' controlled, and 50' home environment; up/down 12 steps 2 rails  Communication     Cognition  Supervision   Pain  < 3  Safety/Judgment  Supervision   Therapy Plan: PT Intensity: Minimum of 1-2 x/day ,45 to 90 minutes PT Frequency: 5 out of 7 days PT Duration Estimated Length of Stay: 3-5 OT Intensity: Minimum of 1-2 x/day, 45 to 90 minutes OT Frequency: 5 out of 7 days OT Duration/Estimated Length of Stay: 3-5 days SLP Intensity: Minumum of 1-2 x/day, 30 to 90 minutes SLP Frequency: 3 to 5 out of 7 days SLP Duration/Estimated Length of Stay: ~5 days        Team Interventions: Nursing Interventions Patient/Family Education, Medication Management, Disease Management/Prevention, Cognitive Remediation/Compensation  PT interventions Ambulation/gait training, Training and development officer,  Cognitive remediation/compensation, Discharge planning, Community reintegration, DME/adaptive equipment instruction, Functional mobility training, Patient/family education, Pain management, Neuromuscular re-education, Psychosocial support, Splinting/orthotics, Therapeutic Exercise, Therapeutic Activities, Stair training, UE/LE Strength taining/ROM, UE/LE Coordination activities, Wheelchair propulsion/positioning  OT Interventions Cognitive remediation/compensation, Patient/family education, DME/adaptive equipment instruction, Discharge planning, Disease mangement/prevention, Therapeutic Exercise, Self Care/advanced ADL retraining, Therapeutic Activities  SLP Interventions Cognitive remediation/compensation, Cueing hierarchy, Internal/external aids, Environmental controls, Functional tasks, Patient/family education  TR Interventions    SW/CM Interventions Discharge Planning, Psychosocial Support, Patient/Family Education    Team Discharge Planning: Destination: PT-Home ,OT- Home , SLP-Home Projected Follow-up: PT-Outpatient PT (cardiac rehab when appropriate), OT-  None, SLP-Home Health SLP, Outpatient SLP, 24 hour supervision/assistance Projected Equipment Needs: PT-To be determined, OT- Tub/shower bench, SLP-None recommended by SLP Equipment Details: PT- , OT-  Patient/family involved in discharge planning: PT- Patient,  OT-Patient, SLP-Patient, Family member/caregiver  MD ELOS: 3-5 days. Medical Rehab Prognosis:  Excellent Assessment:  65 y.o.with history of asthma, T2DM, asthma, gout, HTN who was admitted on 08/27/2016 after he collapsed walking back from the bathroom. CPR not performed by family and EMS arrived 10 minutes later, performedCPR 21 minutes with 7 rounds of epi, 5 shocks,amiodarone,and magnesium given. He coded again en route to the hospital and Cranial CT scan negative. He had episode of NSVT in CT scanner and treated with amiodarone. EKG with inferior T wave inversions and was  treated with [employer] protocol and elevated troponin felt to be due to shock applied for VT/VF.   Echocardiogram with severe  LVH, EF 35-40%, akinesis of the basal inferior and apical myocardium, mild AVR and MVR. Resuscitation time approximately  21 minutes and neurology consulted due to concerns of seizures as well as anxoic injury. EEG with diffuse cerebral dysfunction but no seizures and Dr. Leonel Ramsay recommended supportive care for management of anoxic encephalopathy. He tolerate extubation on 12/7 and has continued to have bouts of confusion with agitation. Cardiac cath 12/14 revealed 90% ostial stenosis distal R-RCA with left to right collaterals and 100% stenosis mid RCA to distal RCA lesion. Medical therapy recommended with consideration of  PCI if patient develops angina. On amiodarone for NSVT and ICD placement delayed due to Citrobacter Koseri bacteremia--LiveVest ordered by Dr. Curt Bears. IV Vanc/Zosyn transitioned to Rocephin with 2 week antibiotic therapy recommended per discussion with ID.  He continues to have recurrent hypokalemia requiring supplementation--needs to keep K > 4.0 and Mg > 2.0.  Being loaded with amiodarone and to decrease to 200 mg daily (on 12/28). Pt with resulting functional deficits with cognition, balance and endurance.  Will set goals for Mod I with PT/OT/SLP.   See Team Conference Notes for weekly updates to the plan of care

## 2016-09-16 NOTE — Evaluation (Signed)
Speech Language Pathology Assessment and Plan  Patient Details  Name: Dean [name] MRN: [id number] Date of Birth: 1951-05-11  SLP Diagnosis: Cognitive Impairments  Rehab Potential: Good ELOS: ~5 days     Today's Date: 09/16/2016 SLP Individual Time: 2482-5003 SLP Individual Time Calculation (min): 60 min    Problem List:  Patient Active Problem List   Diagnosis Date Noted  . Bacteremia   . Anoxic brain injury ([place]) 09/15/2016  . Benign essential HTN   . OSA (obstructive sleep apnea)   . Transaminitis   . Acute lower UTI   . Bacteremia due to Gram-negative bacteria   . Chronic respiratory failure with hypoxia ([place])   . Fever   . Acute cystitis without hematuria   . NSVT (nonsustained ventricular tachycardia) ([place])   . Uncomplicated asthma   . Diabetes mellitus type 2 in obese ([place])   . Acute on chronic combined systolic and diastolic CHF (congestive heart failure) ([place])   . Tachypnea   . Hypernatremia   . Hypokalemia   . Leukocytosis   . Acute blood loss anemia   . Hypoxemia   . Acute respiratory failure ([place])   . Acute encephalopathy   . Cardiopulmonary arrest ([place]) 08/27/2016  . DM2 (diabetes mellitus, type 2) ([place]) 03/02/2014  . Morbid obesity ([place]) 03/02/2014  . Essential hypertension, benign 03/02/2014   Past Medical History:  Past Medical History:  Diagnosis Date  . Arthritis    "maybe in his spine" (09/05/2016)  . Asthma   . Cardiac arrest ([place]) 08/27/2016   REQUIRING CPR, SHOCK & MEDICATIONS  . Gout   . High cholesterol   . Hypertension   . OSA on CPAP   . Type II diabetes mellitus ([place])    Past Surgical History:  Past Surgical History:  Procedure Laterality Date  . CARDIAC CATHETERIZATION N/A 09/07/2016   Procedure: Left Heart Cath and Coronary Angiography;  Surgeon: Peter M Martinique, MD;  Location: [facility];  Service: Cardiovascular;  Laterality: N/A;  . NO PAST SURGERIES      Assessment / Plan / Recommendation Clinical  Impression   [name] a 65 y.o.with history of asthma, T2DM, asthma, gout, HTN who was admitted on 08/27/2016 after he collapsed walking back from the bathroom. CPR not performed by family and EMS arrived 10 minutes later, performedCPR 21 minutes with 7 rounds of epi, 5 shocks,amiodarone,and magnesium given. He coded again en route to the hospital and Cranial CT scan negative. He had episode of NSVT in CT scanner and treated with amiodarone.  Resuscitation time approximately  21 minutes and neurology consulted due to concerns of seizures as well as anxoic injury. EEG with diffuse cerebral dysfunction but no seizures and Dr. Leonel Ramsay recommended supportive care for management of anoxic encephalopathy. He tolerate extubation on 12/7 and has continued to have bouts of confusion with agitation. Therapy ongoing and patient with cognitive deficits, poor safety awareness and unsteady gait. CIR recommended for follow up therapy. Admitted to CIR on 09/15/2016.  SLP evaluation was completed on 09/16/2016 with the following results: Pt presents with s/s of grossly intact oropharyngeal swallowing function with no overt s/s of aspiration evident with solids or liquids, even when consuming mixed consistencies and large consecutive boluses of thin liquids via straw.  Pt and family report good toleration of diet advancement.  No further ST needs indicated for swallowing.  Pt presents with mild cognitive impairment in the setting of anoxic brain injury characterized by decreased selective attention to tasks, decreased  delayed recall of information, decreased functional problem solving, decreased safety awareness, and decreased emergent awareness of deficits.    Given the abovementioned deficits pt would benefit from skilled ST while inpatient in order to maximize functional independence and reduce burden of care prior to discharge.      Skilled Therapeutic Interventions          Cognitive-linguistic and  bedside swallow evaluation completed with results and recommendations reviewed with patient and family.  Pt scored 22/30 on the MoCA with deficits most notable on delayed recall, selective attention, and visuospatial/constructional executive functioning subtests.  Pt could only recall 1 word from 5 word delayed recall subtest which improved to 4 out of 5 with mod assist verbal cues/choice of three.   Pt also noted with impulsivity with mobility requiring min assist for safety during ambulation to and from bathroom.  Pt was left in bed at the end of today's therapy session with family at bedside.       SLP Assessment  Patient will need skilled [place] [facility] during CIR admission    Recommendations  Patient destination: Home Follow up Recommendations: Home Health SLP;Outpatient SLP;24 hour supervision/assistance Equipment Recommended: None recommended by SLP    SLP Frequency 3 to 5 out of 7 days   SLP Duration  SLP Intensity  SLP Treatment/Interventions ~5 days   Minumum of 1-2 x/day, 30 to 90 minutes  Cognitive remediation/compensation;Cueing hierarchy;Internal/external aids;Environmental controls;Functional tasks;Patient/family education    Pain Pain Assessment Pain Assessment: No/denies pain  Prior Functioning Cognitive/Linguistic Baseline: Within functional limits Type of Home: House  Lives With: Spouse Available Help at Discharge: Family Education: HS Vocation: Self employed  Function:  Eating Eating   Modified Consistency Diet: No Eating Assist Level: Swallowing techniques: self managed           Cognition Comprehension Comprehension assist level: Follows complex conversation/direction with extra time/assistive device  Expression   Expression assist level: Expresses complex ideas: With extra time/assistive device  Social Interaction Social Interaction assist level: Interacts appropriately 90% of the time - Needs monitoring or encouragement for  participation or interaction.  Problem Solving Problem solving assist level: Solves basic 75 - 89% of the time/requires cueing 10 - 24% of the time  Memory Memory assist level: Recognizes or recalls 50 - 74% of the time/requires cueing 25 - 49% of the time   Short Term Goals: Week 1: SLP Short Term Goal 1 (Week 1): STG=LTG due to ELOS   Refer to Care Plan for Long Term Goals  Recommendations for other services: None   Discharge Criteria: Patient will be discharged from SLP if patient refuses treatment 3 consecutive times without medical reason, if treatment goals not met, if there is a change in medical status, if patient makes no progress towards goals or if patient is discharged from hospital.  The above assessment, treatment plan, treatment alternatives and goals were discussed and mutually agreed upon: by patient and by family  Emilio Math 09/16/2016, 12:42 PM

## 2016-09-16 NOTE — Evaluation (Signed)
Occupational Therapy Assessment and Plan  Patient Details  Name: Dean [name] MRN: [id number] Date of Birth: 1951-08-26  OT Diagnosis: altered mental status Rehab Potential: Rehab Potential (ACUTE ONLY): Good ELOS: 3-5 days   Today's Date: 09/16/2016 OT Individual Time: 2505-3976 OT Individual Time Calculation (min): 75 min      Problem List:  Patient Active Problem List   Diagnosis Date Noted  . Bacteremia   . Anoxic brain injury ([place]) 09/15/2016  . Benign essential HTN   . OSA (obstructive sleep apnea)   . Transaminitis   . Acute lower UTI   . Bacteremia due to Gram-negative bacteria   . Chronic respiratory failure with hypoxia ([place])   . Fever   . Acute cystitis without hematuria   . NSVT (nonsustained ventricular tachycardia) ([place])   . Uncomplicated asthma   . Diabetes mellitus type 2 in obese ([place])   . Acute on chronic combined systolic and diastolic CHF (congestive heart failure) ([place])   . Tachypnea   . Hypernatremia   . Hypokalemia   . Leukocytosis   . Acute blood loss anemia   . Hypoxemia   . Acute respiratory failure ([place])   . Acute encephalopathy   . Cardiopulmonary arrest ([place]) 08/27/2016  . DM2 (diabetes mellitus, type 2) ([place]) 03/02/2014  . Morbid obesity ([place]) 03/02/2014  . Essential hypertension, benign 03/02/2014    Past Medical History:  Past Medical History:  Diagnosis Date  . Arthritis    "maybe in his spine" (09/05/2016)  . Asthma   . Cardiac arrest ([place]) 08/27/2016   REQUIRING CPR, SHOCK & MEDICATIONS  . Gout   . High cholesterol   . Hypertension   . OSA on CPAP   . Type II diabetes mellitus ([place])    Past Surgical History:  Past Surgical History:  Procedure Laterality Date  . CARDIAC CATHETERIZATION N/A 09/07/2016   Procedure: Left Heart Cath and Coronary Angiography;  Surgeon: Peter M Martinique, MD;  Location: [facility];  Service: Cardiovascular;  Laterality: N/A;  . NO PAST SURGERIES      Assessment & Plan Clinical  Impression: Patient is a 65 y.o.male with history of asthma, T2DM, asthma, gout, HTN who was admitted on 08/27/2016 after he collapsed walking back from the bathroom. CPR not performed by family and EMS arrived 10 minutes later, performedCPR 21 minutes with 7 rounds of epi, 5 shocks,amiodarone,and magnesium given. He coded again en route to the hospital and Cranial CT scan negative. He had episode of NSVT in CT scanner and treated with amiodarone. EKG with inferior T wave inversions and was treated with [employer] protocol and elevated troponin felt to be due to shock applied for VT/VF.   Echocardiogram with severe LVH, EF 35-40%, akinesis of the basal inferior and apical myocardium, mild AVR and MVR. Resuscitation time approximately  21 minutes and neurology consulted due to concerns of seizures as well as anxoic injury. EEG with diffuse cerebral dysfunction but no seizures and Dr. Leonel Ramsay recommended supportive care for management of anoxic encephalopathy. He tolerate extubation on 12/7 and has continued to have bouts of confusion with agitation.   Cardiac cath 12/14 revealed 90% ostial stenosis distal R-RCA with left to right collaterals and 100% stenosis mid RCA to distal RCA lesion. Medical therapy recommended with consideration of  PCI if patient develops angina. On amiodarone for NSVT and ICD placement delayed due to Citrobacter Koseri bacteremia--LiveVest ordered by Dr. Curt Bears. IV Vanc/Zosyn transitioned to Rocephin with 2 week antibiotic therapy recommended  per discussion with ID.  He continues to have recurrent hypokalemia requiring supplementation--needs to keep K > 4.0 and Mg > 2.0.  Being loaded with amiodarone and to decrease to 200 mg daily (on 12/28). Therapy ongoing and patient with cognitive deficits, poor safety awareness and unsteady gait. CIR recommended for follow up therapy.    Patient transferred to CIR on 09/15/2016 .    Patient currently requires minimal assist with basic  self-care skills secondary to decreased endurance, decreased awareness and decreased memory.  Prior to hospitalization, patient could complete BADL independently.  Patient will benefit from skilled intervention to increase independence with basic self-care skills prior to discharge home with care partner.  Anticipate patient will require intermittent supervision and no further OT follow recommended.  OT - End of Session Activity Tolerance: Tolerates 30+ min activity with multiple rests Endurance Deficit: Yes OT Assessment Rehab Potential (ACUTE ONLY): Good Barriers to Discharge: Decreased caregiver support OT Patient demonstrates impairments in the following area(s): Cognition;Endurance;Balance OT Basic ADL's Functional Problem(s): Bathing;Dressing;Toileting OT Advanced ADL's Functional Problem(s): Other (comment) OT Transfers Functional Problem(s): Toilet;Tub/Shower OT Additional Impairment(s): None OT Plan OT Intensity: Minimum of 1-2 x/day, 45 to 90 minutes OT Frequency: 5 out of 7 days OT Duration/Estimated Length of Stay: 3-5 days OT Treatment/Interventions: Cognitive remediation/compensation;Patient/family education;DME/adaptive equipment instruction;Discharge planning;Disease mangement/prevention;Therapeutic Exercise;Self Care/advanced ADL retraining;Therapeutic Activities OT Self Feeding Anticipated Outcome(s): Mod I OT Basic Self-Care Anticipated Outcome(s): Mod I OT Toileting Anticipated Outcome(s): Mod I OT Bathroom Transfers Anticipated Outcome(s): Supervision OT Recommendation Patient destination: Home Follow Up Recommendations: None Equipment Recommended: Tub/shower bench  Skilled Therapeutic Intervention OT 1:1 initial evaluation completed with treatment provided to address attention, safety awareness, dynamic standing balance and memory (immediate recall).   Pt reports awareness of some memory deficits prior to admission and accepts correction and reiteration of  directions appropriately.   Pt was able to perform transfers and BADL with extra time, steadying assist, and min instructional cues for safe transfers and effective use of DME.  OT Evaluation Precautions/Restrictions  Precautions Precautions: Fall Restrictions Weight Bearing Restrictions: No   General Chart Reviewed: Yes Family/Caregiver Present: No   Vital Signs Therapy Vitals Temp: 98 F (36.7 C) Temp Source: Oral Pulse Rate: (!) 59 Resp: 18 BP: (!) 160/55 Patient Position (if appropriate): Lying Oxygen Therapy SpO2: 99 % O2 Device: Not Delivered   Pain Pain Assessment Pain Assessment: No/denies pain   Home Living/Prior Functioning Home Living Family/patient expects to be discharged to:: Private residence Living Arrangements: Spouse/significant other, Children Available Help at Discharge: Family Type of Home: House Home Access: Level entry Home Layout: Multi-level Bathroom Shower/Tub: Tub/shower unit, Architectural technologist: Standard Bathroom Accessibility: Yes Additional Comments: Pt states he has 5 children; only Philmore Pali lives at home  Lives With: Spouse, Son (65 y/o son with down syndrome) IADL History Homemaking Responsibilities: Yes Meal Prep Responsibility: No Laundry Responsibility: No Cleaning Responsibility: Secondary Bill Paying/Finance Responsibility: Secondary Shopping Responsibility: No Child Care Responsibility: No Current License: Yes Mode of Transportation: Car Education: HS 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   ADL ADL ADL Comments: see  Functional Assessment Tools   Vision/Perception  Vision- History  Baseline Vision/History: No visual deficits Patient Visual Report: No change from baseline Vision- Assessment Vision Assessment?: No apparent visual deficits   Cognition Overall Cognitive Status: Impaired/Different from baseline Arousal/Alertness: Awake/alert Orientation Level: Person;Place;Situation Person: Oriented Place: Oriented Situation: Oriented Year: 2017 Month: December Day of Week: Correct Memory: Appears intact Immediate Memory Recall: Sock;Blue;Bed Memory Recall: Sock;Blue;Bed Memory Recall Sock: With Cue Memory Recall Blue: Without Cue Memory Recall Bed: With Cue Attention: Selective Selective Attention: Appears intact Awareness: Appears intact Problem Solving: Appears intact Safety/Judgment: Appears intact   Sensation Sensation Light Touch: (P) Appears Intact Stereognosis: (P) Appears Intact Hot/Cold: (P) Appears Intact Proprioception: (P) Appears Intact   Motor  Motor Motor: Within Functional Limits   Mobility  Bed Mobility Bed Mobility: Rolling Right;Rolling Left;Left Sidelying to Sit;Supine to Sit Rolling Right: 6: Modified independent (Device/Increase time) Rolling Left: 6: Modified independent (Device/Increase time) Left Sidelying to Sit: 5: Supervision Supine to Sit: 5: Supervision (effortful)   Trunk/Postural Assessment  Cervical Assessment Cervical Assessment: Within Functional Limits Thoracic Assessment Thoracic Assessment: Within Functional Limits Lumbar Assessment Lumbar Assessment: Within Functional Limits Postural Control Postural Control: Within Functional Limits   Balance Balance Balance Assessed: Yes Static Sitting Balance Static Sitting - Level of Assistance: 7: Independent Dynamic Sitting Balance Dynamic Sitting - Level of Assistance: 5: Stand by assistance Static Standing Balance Static Standing - Level of Assistance: 5: Stand by assistance Dynamic  Standing Balance Dynamic Standing - Level of Assistance: 5: Stand by assistance   Extremity/Trunk Assessment RUE Assessment RUE Assessment: Within Functional Limits LUE Assessment LUE Assessment: Within Functional Limits   See Function Navigator for Current Functional Status.   Refer to Care Plan for Long Term Goals  Recommendations for other services: None    Discharge Criteria: Patient will be discharged from OT if patient refuses treatment 3 consecutive times without medical reason, if treatment goals not met, if there is a change in medical status, if patient makes no progress towards goals or if patient is discharged from hospital.  The above assessment, treatment plan, treatment alternatives and goals were discussed and mutually agreed upon: by patient  [facility] 09/16/2016, 3:35 PM

## 2016-09-17 ENCOUNTER — Ambulatory Visit (HOSPITAL_COMMUNITY): Payer: Medicare Other | Admitting: Physical Therapy

## 2016-09-17 ENCOUNTER — Inpatient Hospital Stay (HOSPITAL_COMMUNITY): Payer: Medicare Other | Admitting: Speech Pathology

## 2016-09-17 ENCOUNTER — Inpatient Hospital Stay (HOSPITAL_COMMUNITY): Payer: Medicare Other | Admitting: Occupational Therapy

## 2016-09-17 DIAGNOSIS — R410 Disorientation, unspecified: Secondary | ICD-10-CM

## 2016-09-17 DIAGNOSIS — I5021 Acute systolic (congestive) heart failure: Secondary | ICD-10-CM

## 2016-09-17 LAB — CULTURE, BLOOD (ROUTINE X 2)
Culture: NO GROWTH
Culture: NO GROWTH

## 2016-09-17 LAB — BASIC METABOLIC PANEL
Anion gap: 12 (ref 5–15)
BUN: 8 mg/dL (ref 6–20)
CO2: 21 mmol/L — ABNORMAL LOW (ref 22–32)
Calcium: 10 mg/dL (ref 8.9–10.3)
Chloride: 104 mmol/L (ref 101–111)
Creatinine, Ser: 1.04 mg/dL (ref 0.61–1.24)
GFR calc Af Amer: >60 mL/min (ref >60)
GFR calc non Af Amer: >60 mL/min (ref >60)
Glucose, Bld: 87 mg/dL (ref 65–99)
Potassium: 4.5 mmol/L (ref 3.5–5.1)
Sodium: 137 mmol/L (ref 135–145)

## 2016-09-17 LAB — GLUCOSE, CAPILLARY
Glucose-Capillary: 105 mg/dL — ABNORMAL HIGH (ref 65–99)
Glucose-Capillary: 100 mg/dL — ABNORMAL HIGH (ref 65–99)
Glucose-Capillary: 99 mg/dL (ref 65–99)
Glucose-Capillary: 130 mg/dL — ABNORMAL HIGH (ref 65–99)

## 2016-09-17 MED ORDER — DOCUSATE SODIUM 100 MG PO CAPS
100.0000 mg | ORAL_CAPSULE | Freq: Every day | ORAL | Status: DC
  Administered 2016-09-18 – 2016-09-21 (×4): 100 mg via ORAL
  Filled 2016-09-17 (×4): qty 1

## 2016-09-17 MED ORDER — LISINOPRIL 5 MG PO TABS
5.0000 mg | ORAL_TABLET | Freq: Once | ORAL | Status: AC
  Administered 2016-09-17: 5 mg via ORAL
  Filled 2016-09-17: qty 1

## 2016-09-17 MED ORDER — LISINOPRIL 10 MG PO TABS
10.0000 mg | ORAL_TABLET | Freq: Every day | ORAL | Status: DC
  Administered 2016-09-18 – 2016-09-19 (×2): 10 mg via ORAL
  Filled 2016-09-17 (×2): qty 1

## 2016-09-17 NOTE — Progress Notes (Signed)
[facility] PHYSICAL MEDICINE & REHABILITATION     PROGRESS NOTE  Subjective/Complaints:  Pt seen sitting at the nurses station this AM.  Pt was restless overnight.  He appears alert/oriented on exam.  He complains of sternal discomfort.    ROS: +MSK chest pain. Denies SOB, N/V/D.  Objective: Vital Signs: Blood pressure (!) 141/50, pulse (!) 58, temperature 98.2 F (36.8 C), temperature source Oral, resp. rate 18, weight 114 kg (251 lb 5.2 oz), SpO2 100 %. No results found.  Recent Labs  09/16/16 0537  WBC 11.8*  HGB 11.1*  HCT 35.0*  PLT 330    Recent Labs  09/16/16 0537 09/17/16 0532  NA 139 137  K 4.2 4.5  CL 104 104  GLUCOSE 102* 87  BUN 9 8  CREATININE 1.05 1.04  CALCIUM 9.9 10.0   CBG (last 3)   Recent Labs  09/16/16 2106 09/17/16 0636 09/17/16 1216  GLUCAP 114* 105* 100*    Wt Readings from Last 3 Encounters:  09/17/16 114 kg (251 lb 5.2 oz)  09/15/16 116.6 kg (257 lb 1.6 oz)  03/02/14 133.8 kg (295 lb)    Physical Exam:  BP (!) 141/50 (BP Location: Left Arm)   Pulse (!) 58   Temp 98.2 F (36.8 C) (Oral)   Resp 18   Wt 114 kg (251 lb 5.2 oz)   SpO2 100%   BMI 40.56 kg/m  Constitutional: He appears well-developed. Obese. NAD.  HENT: Normocephalic and atraumatic.  Eyes: EOM are normal. No discharge. Cardiovascular: RRR.  No JVD. Respiratory: Effort normal and breath sounds normal.  GI: Soft. Bowel sounds are normal.  Musculoskeletal: He exhibits edema (min edema BLE). He exhibits no tenderness.  Neurological: He is alert and oriented.  Motor: 4+-5/5 throughout (unchanged) Sensation intact to light touch  Skin: Skin is warm and dry. No erythema.  Psychiatric: He has a normal mood and affect. His behavior is normal.   Assessment/Plan: 1. Functional deficits secondary to debility/acute encephalopathy which require 3+ hours per day of interdisciplinary therapy in a comprehensive inpatient rehab setting. Physiatrist is providing close team  supervision and 24 hour management of active medical problems listed below. Physiatrist and rehab team continue to assess barriers to discharge/monitor patient progress toward functional and medical goals.  Function:  Bathing Bathing position   Position: Shower  Bathing parts Body parts bathed by patient: Right arm, Left arm, Chest, Abdomen, Front perineal area, Buttocks, Right upper leg, Left upper leg, Right lower leg, Left lower leg    Bathing assist Assist Level: Supervision or verbal cues      Upper Body Dressing/Undressing Upper body dressing   What is the patient wearing?: Hospital gown                Upper body assist        Lower Body Dressing/Undressing Lower body dressing   What is the patient wearing?: Underwear, Shoes Underwear - Performed by patient: Thread/unthread right underwear leg, Thread/unthread left underwear leg, Pull underwear up/down               Shoes - Performed by patient: Don/doff right shoe, Don/doff left shoe            Lower body assist Assist for lower body dressing: Touching or steadying assistance (Pt > 75%)      Toileting Toileting   Toileting steps completed by patient: Adjust clothing prior to toileting, Performs perineal hygiene, Adjust clothing after toileting      Toileting  assist Assist level: Supervision or verbal cues   Transfers Chair/bed transfer   Chair/bed transfer method: Stand pivot Chair/bed transfer assist level: Touching or steadying assistance (Pt > 75%)       Locomotion Ambulation     Max distance: 150 Assist level: Touching or steadying assistance (Pt > 75%)   Wheelchair          Cognition Comprehension Comprehension assist level: Follows basic conversation/direction with extra time/assistive device  Expression Expression assist level: Expresses complex 90% of the time/cues < 10% of the time  Social Interaction Social Interaction assist level: Interacts appropriately 90% of the time -  Needs monitoring or encouragement for participation or interaction.  Problem Solving Problem solving assist level: Solves basic 50 - 74% of the time/requires cueing 25 - 49% of the time  Memory Memory assist level: Recognizes or recalls 50 - 74% of the time/requires cueing 25 - 49% of the time     Medical Problem List and Plan: 1.  Poor endurance and safety awareness secondary to debility/acute encephalopathy.  Cont CIR 2.  DVT Prophylaxis/Anticoagulation: Pharmaceutical: Lovenox 3. Pain Management: N/A 4. Mood: Some anxiety noted--team to provide ego support. LCSW to follow for evaluation and support.  5. Neuropsych: This patient is not fully capable of making decisions on  his own behalf. 6. Skin/Wound Care: routine pressure relief measures  7. Fluids/Electrolytes/Nutrition: Monitor I/O. Supplements prn if intake poor.  8. NSVT: LifeVest in place.   Decrease amiodarone to 200 mg daily in a week (12/28).   ASA resumed.  Follow up with Dr. Curt Bears after discharge.  9. 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 Continue lasix, ace and BB.  Filed Weights   09/16/16 0500 09/17/16 0522  Weight: 114 kg (251 lb 4 oz) 114 kg (251 lb 5.2 oz)   Stable 10 HTN: Monitor BID.   Continue coreg, lasix and Prinivil.   Monitor renal status and electrolytes closely.   Monitor with increased mobility 11. Hypokalemia: Monitor lytes daily for now. EP recommends K+./=4.0 and Mg >/+2.0  K+ 4.5 on 12/24  Labs ordered for tomorrow 12.  Diabetes: Hgb A1c- 5.5. Monitor BS ac/hs.   Stable 12/24 13. OSA: does not use CPAP consistently at home.   Encourage compliance.  14. Citrobacter Bacteremia: Antibiotic day # 7/14 15. Transaminitis: Question due to shocked liver  Medication history reviewed and discussed with pharmacy, no obvious new offending agents  Labs ordered for tomorrow  Will cont to monitor 16. ABLA:   Hb 11.1 on 12/23  Labs ordered for tomorrow  Cont to monitor 17.  Leukocytosis  WBCs 11.8 on 12/23  Labs ordered for tomorrow  Cont to monitor 18. Disorientation  Likely related sleep/wake  Ammonia ordered 19. Morbid obesity  Body mass index is 40.56 kg/m.  Diet and exercise education  Encourage weight loss to increase endurance and promote overall health  LOS (Days) 2 A FACE TO FACE EVALUATION WAS PERFORMED  Raelan Burgoon Lorie Phenix 09/17/2016 2:40 PM

## 2016-09-17 NOTE — Progress Notes (Signed)
Physical Therapy Session Note  Patient Details  Name: Dean [name] MRN: [id number] Date of Birth: 11-Jan-1951   Today's Date: 09/17/2016 PT Concurrent Time: 1240-1410 PT Concurrent Time Calculation (min): 90 min    Short Term Goals: Week 1:  PT Short Term Goal 1 (Week 1): = LTGs due to ELOS  Skilled Therapeutic Interventions/Progress Updates:   Pt participated in concurrent therapy session with focus on LE strengthening with pt declining ambulation at this time reporting he is too fatigued. Assessed Berg balance scale with results as below, demonstrating high risk for falls. Pt performed seated and supine LE strengthening exercises 3x15 each with 1# ankle cuff weights. Provided pt with handout to allow performance outside of therapy sessions. Standing heel/toe raises 2x15 each. Heel/toe walking forward/backward without UE support performed for dynamic balance and coordination. Static tandem stance 2x20 sec each side. Returned to room with w/c propulsion modI. Remained seated on EOB with RN present at end of session.  Therapy Documentation Precautions:  Precautions Precautions: Fall, Other (comment) Precaution Comments: on lIfe Vest with cell phone Restrictions Weight Bearing Restrictions: No Pain: Pain Assessment Pain Assessment: No/denies pain  Balance Balance Assessed: Yes Standardized Balance Assessment Standardized Balance Assessment: Berg Balance Test Berg Balance Test Sit to Stand: Able to stand without using hands and stabilize independently Standing Unsupported: Able to stand safely 2 minutes Sitting with Back Unsupported but Feet Supported on Floor or Stool: Able to sit safely and securely 2 minutes Stand to Sit: Sits safely with minimal use of hands Transfers: Able to transfer safely, minor use of hands Standing Unsupported with Eyes Closed: Able to stand 10 seconds safely Standing Ubsupported with Feet Together: Able to place feet together independently and stand 1  minute safely From Standing, Reach Forward with Outstretched Arm: Can reach forward >5 cm safely (2") From Standing Position, Pick up Object from Floor: Able to pick up shoe safely and easily From Standing Position, Turn to Look Behind Over each Shoulder: Looks behind from both sides and weight shifts well Turn 360 Degrees: Able to turn 360 degrees safely but slowly Standing Unsupported, Alternately Place Feet on Step/Stool: Able to stand independently and complete 8 steps >20 seconds Standing Unsupported, One Foot in Front: Able to plae foot ahead of the other independently and hold 30 seconds Standing on One Leg: Able to lift leg independently and hold equal to or more than 3 seconds Total Score: 48   See Function Navigator for Current Functional Status.   Therapy/Group: Individual Therapy  Luberta Mutter 09/17/2016, 2:12 PM

## 2016-09-17 NOTE — Progress Notes (Signed)
Speech Language Pathology Daily Session Note  Patient Details  Name: Dean [name] MRN: [id number] Date of Birth: Oct 10, 1950  Today's Date: 09/17/2016 SLP Individual Time: [id number] SLP Individual Time Calculation (min): 60 min   Short Term Goals:Week 1: SLP Short Term Goal 1 (Week 1): STG=LTG due to ELOS   Skilled Therapeutic Interventions: Skilled treatment session focused on addressing cognition goals. SLP facilitated session by providing Supervision cues for selective attention in a mildly distracting environment over the 60 minute session.  Patient was able to recall 3 medications from prior to admission, but was unable to recall any changes that have been made since hospitalization.  Additionally, he became a little defensive saying that no one has talked to him about any of this and it is not his fault that he doesn't know.  Patient with little to no awareness of cognitive changes post hospitalization.  SLP informed patient that the implementation of an external aid will help him recall the new ones and one was made with patient.  At end of session, patient required extra time to process and locate functions and frequencies of current meds.  Recommend initiation of pill box during next session.  Continue with current plan of care.    Function:  Cognition Comprehension Comprehension assist level: Follows basic conversation/direction with extra time/assistive device  Expression   Expression assist level: Expresses complex 90% of the time/cues < 10% of the time  Social Interaction Social Interaction assist level: Interacts appropriately 90% of the time - Needs monitoring or encouragement for participation or interaction.  Problem Solving Problem solving assist level: Solves basic 50 - 74% of the time/requires cueing 25 - 49% of the time  Memory Memory assist level: Recognizes or recalls 50 - 74% of the time/requires cueing 25 - 49% of the time    Pain Pain Assessment Pain  Assessment: No/denies pain  Therapy/Group: Individual Therapy  Carmelia Roller., CCC-SLP [id number]  [place] 09/17/2016, 1:09 PM

## 2016-09-17 NOTE — Plan of Care (Signed)
Problem: RH SAFETY Goal: RH STG ADHERE TO SAFETY PRECAUTIONS W/ASSISTANCE/DEVICE STG Adhere to Safety Precautions With supervision.   Outcome: Progressing Pt follows commands and calls for help at this time

## 2016-09-17 NOTE — Progress Notes (Signed)
Occupational Therapy Session Note  Patient Details  Name: Dean [name] MRN: [id number] Date of Birth: [birth year]  Today's Date: 09/17/2016 OT Individual Time: [id number] OT Individual Time Calculation (min): 57 min   Short Term Goals: Week 1:  OT Short Term Goal 1 (Week 1): STG = LTG d/t short ELOS  Skilled Therapeutic Interventions/Progress Updates:   Pt was sitting at EOB with bed alarm activated upon arrival. Pt reported feeling confused regarding his ELOS and therapy schedule. Pt educated on recommended LOS by therapists at this time and reoriented to weekend schedule. Therapeutic listening appeared to ease pts concerns. Pt was then agreeable to shower. He ambulated in room without device with close supervision to gather dressing items, and then transferred to tub bench. He reported being unaware of incontinent episode in brief. Bathing completed seated and standing as needed with grab bars and close supervision. Dressing was then completed in recliner. Min-Mod cues for sequencing and safety, with pt requiring assistance with donning life vest under hospital gown due to difficultly with orienting correctly.  Pt was then transferred to EOB and setup for lunch at time of departure. Bed alarm activated.   Therapy Documentation Precautions:  Precautions Precautions: Fall, Other (comment) Precaution Comments: on lIfe Vest with cell phone Restrictions Weight Bearing Restrictions: No  Pain: No c/o pain during session    ADL: ADL ADL Comments: see Functional Assessment Tools    See Function Navigator for Current Functional Status.   Therapy/Group: Individual Therapy  Kinan Safley [address] [place] 09/17/2016, 12:50 PM

## 2016-09-18 DIAGNOSIS — N179 Acute kidney failure, unspecified: Secondary | ICD-10-CM

## 2016-09-18 DIAGNOSIS — N1832 Chronic kidney disease, stage 3b: Secondary | ICD-10-CM

## 2016-09-18 LAB — COMPREHENSIVE METABOLIC PANEL
ALT: 85 U/L — ABNORMAL HIGH (ref 17–63)
AST: 38 U/L (ref 15–41)
Albumin: 2.9 g/dL — ABNORMAL LOW (ref 3.5–5.0)
Alkaline Phosphatase: 93 U/L (ref 38–126)
Anion gap: 4 — ABNORMAL LOW (ref 5–15)
BUN: 12 mg/dL (ref 6–20)
CO2: 30 mmol/L (ref 22–32)
Calcium: 10 mg/dL (ref 8.9–10.3)
Chloride: 103 mmol/L (ref 101–111)
Creatinine, Ser: 1.27 mg/dL — ABNORMAL HIGH (ref 0.61–1.24)
GFR calc Af Amer: >60 mL/min (ref >60)
GFR calc non Af Amer: 57 mL/min — ABNORMAL LOW (ref >60)
Glucose, Bld: 105 mg/dL — ABNORMAL HIGH (ref 65–99)
Potassium: 4.6 mmol/L (ref 3.5–5.1)
Sodium: 137 mmol/L (ref 135–145)
Total Bilirubin: 0.6 mg/dL (ref 0.3–1.2)
Total Protein: 6.6 g/dL (ref 6.5–8.1)

## 2016-09-18 LAB — GLUCOSE, CAPILLARY
Glucose-Capillary: 109 mg/dL — ABNORMAL HIGH (ref 65–99)
Glucose-Capillary: 96 mg/dL (ref 65–99)
Glucose-Capillary: 108 mg/dL — ABNORMAL HIGH (ref 65–99)
Glucose-Capillary: 116 mg/dL — ABNORMAL HIGH (ref 65–99)

## 2016-09-18 LAB — AMMONIA: Ammonia: 21 umol/L (ref 9–35)

## 2016-09-18 LAB — MAGNESIUM: Magnesium: 2.1 mg/dL (ref 1.7–2.4)

## 2016-09-18 NOTE — Progress Notes (Signed)
RT NOTE:  RT went to patient about CPAP twice. Pt has had family in the room both times. He stated he will call RN when he is ready to go on CPAP. RT will monitor.

## 2016-09-18 NOTE — Progress Notes (Signed)
[place] PHYSICAL MEDICINE & REHABILITATION     PROGRESS NOTE  Subjective/Complaints:  Pt laying in bed this AM.  Family at bedside.  He had difficulty sleeping due to life best that kept beeping indicating low gel. He want to go home.   ROS: +sternalal soreness. Denies SOB, N/V/D.  Objective: Vital Signs: Blood pressure (!) 134/42, pulse 70, temperature 98.3 F (36.8 C), temperature source Oral, resp. rate 18, weight 112.2 kg (247 lb 5.7 oz), SpO2 100 %. No results found.  Recent Labs  09/16/16 0537  WBC 11.8*  HGB 11.1*  HCT 35.0*  PLT 330    Recent Labs  09/17/16 0532 09/18/16 0521  NA 137 137  K 4.5 4.6  CL 104 103  GLUCOSE 87 105*  BUN 8 12  CREATININE 1.04 1.27*  CALCIUM 10.0 10.0   CBG (last 3)   Recent Labs  09/17/16 1618 09/17/16 2034 09/18/16 0646  GLUCAP 99 130* 109*    Wt Readings from Last 3 Encounters:  09/18/16 112.2 kg (247 lb 5.7 oz)  09/15/16 116.6 kg (257 lb 1.6 oz)  03/02/14 133.8 kg (295 lb)    Physical Exam:  BP (!) 134/42   Pulse 70   Temp 98.3 F (36.8 C) (Oral)   Resp 18   Wt 112.2 kg (247 lb 5.7 oz)   SpO2 100%   BMI 39.92 kg/m  Constitutional: He appears well-developed. Obese. NAD.  HENT: Normocephalic and atraumatic.  Eyes: EOM are normal. No discharge. Cardiovascular: RRR.  No JVD. Respiratory: Effort normal and breath sounds normal.  GI: Soft. Bowel sounds are normal.  Musculoskeletal: He exhibits edema (min edema BLE). He exhibits no tenderness.  Neurological: He is alert and oriented.  Motor: 4+-5/5 throughout (stable) Skin: Skin is warm and dry. No erythema.  Psychiatric: He has a normal mood and affect. His behavior is normal.   Assessment/Plan: 1. Functional deficits secondary to debility/acute encephalopathy which require 3+ hours per day of interdisciplinary therapy in a comprehensive inpatient rehab setting. Physiatrist is providing close team supervision and 24 hour management of active medical  problems listed below. Physiatrist and rehab team continue to assess barriers to discharge/monitor patient progress toward functional and medical goals.  Function:  Bathing Bathing position   Position: Shower  Bathing parts Body parts bathed by patient: Right arm, Left arm, Chest, Abdomen, Front perineal area, Buttocks, Right upper leg, Left upper leg, Right lower leg, Left lower leg    Bathing assist Assist Level: Supervision or verbal cues      Upper Body Dressing/Undressing Upper body dressing   What is the patient wearing?: Hospital gown                Upper body assist Assist Level: Set up      Lower Body Dressing/Undressing Lower body dressing   What is the patient wearing?: Underwear, Pants, Shoes Underwear - Performed by patient: Thread/unthread right underwear leg, Thread/unthread left underwear leg, Pull underwear up/down   Pants- Performed by patient: Thread/unthread right pants leg, Thread/unthread left pants leg, Pull pants up/down           Shoes - Performed by patient: Don/doff right shoe, Don/doff left shoe            Lower body assist Assist for lower body dressing: Touching or steadying assistance (Pt > 75%)      Toileting Toileting   Toileting steps completed by patient: Adjust clothing prior to toileting, Performs perineal hygiene, Adjust clothing after toileting  Toileting assist Assist level: Supervision or verbal cues   Transfers Chair/bed transfer   Chair/bed transfer method: Ambulatory Chair/bed transfer assist level: Supervision or verbal cues Chair/bed transfer assistive device: Armrests     Locomotion Ambulation     Max distance: 150 Assist level: Touching or steadying assistance (Pt > 75%)   Wheelchair          Cognition Comprehension Comprehension assist level: Follows basic conversation/direction with extra time/assistive device  Expression Expression assist level: Expresses complex 90% of the time/cues < 10%  of the time  Social Interaction Social Interaction assist level: Interacts appropriately 90% of the time - Needs monitoring or encouragement for participation or interaction.  Problem Solving Problem solving assist level: Solves basic 50 - 74% of the time/requires cueing 25 - 49% of the time  Memory Memory assist level: Recognizes or recalls 50 - 74% of the time/requires cueing 25 - 49% of the time     Medical Problem List and Plan: 1.  Poor endurance and safety awareness secondary to debility/acute encephalopathy.  Cont CIR 2.  DVT Prophylaxis/Anticoagulation: Pharmaceutical: Lovenox 3. Pain Management: N/A 4. Mood: Some anxiety noted--team to provide ego support. LCSW to follow for evaluation and support.  5. Neuropsych: This patient is not fully capable of making decisions on  his own behalf. 6. Skin/Wound Care: routine pressure relief measures  7. Fluids/Electrolytes/Nutrition: Monitor I/O. Supplements prn if intake poor.  8. NSVT: LifeVest in place.   Decrease amiodarone to 200 mg daily in a week (12/28).   ASA resumed.  Follow up with Dr. Curt Bears after discharge.  9. 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 Continue lasix, ace and BB.  Filed Weights   09/16/16 0500 09/17/16 0522 09/18/16 0500  Weight: 114 kg (251 lb 4 oz) 114 kg (251 lb 5.2 oz) 112.2 kg (247 lb 5.7 oz)   Stable 10 HTN: Monitor BID.   Continue coreg, lasix and Prinivil.   Monitor renal status and electrolytes closely.   Controlled 12/25 11. Hypokalemia: Monitor lytes daily for now. EP recommends K+./=4.0 and Mg >/+2.0  K+ 4.6 on 12/25  Mag 2.1 on 12/25 12.  Diabetes: Hgb A1c- 5.5. Monitor BS ac/hs.   Controlled 12/25 13. OSA: does not use CPAP consistently at home.   Encourage compliance.  14. Citrobacter Bacteremia: Antibiotic day # 8/14 15. Transaminitis: Question due to shocked liver  Medication history reviewed and discussed with pharmacy, no obvious new offending  agents  Improving  Will cont to monitor 16. ABLA:   Hb 11.1 on 12/23  Labs pending  Cont to monitor 17. Leukocytosis  WBCs 11.8 on 12/23  Labs pending  Cont to monitor 18. Disorientation  Likely related sleep/wake  Ammonia WNL 19. Morbid obesity  Body mass index is 39.92 kg/m.  Diet and exercise education  Encourage weight loss to increase endurance and promote overall health 20. AKI  Cr. 1.27 on 12/25  Encourage fluid intake  Will cont to monitor  LOS (Days) 3 A FACE TO FACE EVALUATION WAS PERFORMED  Dean [name] Lorie Phenix 09/18/2016 9:48 AM

## 2016-09-18 NOTE — Progress Notes (Signed)
Patient's life vest sending a notification by beeping indicating that gel needs to be applied to electrodes, made Posey Pronto, MD aware. Also notified [employer] technical support-advised to apply the gel supplied by [employer]. Gel applied and made sure electrodes were in place.  Again life vest began beeping indicating the life vest needed to be opened and adjusted. Several attempts opening and readjusting vest, made sure the electrodes are in place and touching skin appropriately. Elasticity of the vest noted to be worn. [employer] technical support notified made aware that vest is continually beeping even with multiple adjustments, and electrodes cleaned, and are appropriately secure to skin. Tech support states they would sent someone to change patient's vest and make sure everything is working properly.[place]

## 2016-09-19 ENCOUNTER — Inpatient Hospital Stay (HOSPITAL_COMMUNITY): Payer: Medicare Other | Admitting: Physical Therapy

## 2016-09-19 ENCOUNTER — Inpatient Hospital Stay (HOSPITAL_COMMUNITY): Payer: Medicare Other | Admitting: Speech Pathology

## 2016-09-19 ENCOUNTER — Inpatient Hospital Stay (HOSPITAL_COMMUNITY): Payer: Medicare Other

## 2016-09-19 DIAGNOSIS — N183 Chronic kidney disease, stage 3 unspecified: Secondary | ICD-10-CM

## 2016-09-19 LAB — GLUCOSE, CAPILLARY
Glucose-Capillary: 95 mg/dL (ref 65–99)
Glucose-Capillary: 112 mg/dL — ABNORMAL HIGH (ref 65–99)
Glucose-Capillary: 92 mg/dL (ref 65–99)
Glucose-Capillary: 115 mg/dL — ABNORMAL HIGH (ref 65–99)

## 2016-09-19 LAB — BASIC METABOLIC PANEL
Anion gap: 7 (ref 5–15)
BUN: 12 mg/dL (ref 6–20)
CO2: 25 mmol/L (ref 22–32)
Calcium: 10.1 mg/dL (ref 8.9–10.3)
Chloride: 101 mmol/L (ref 101–111)
Creatinine, Ser: 1.25 mg/dL — ABNORMAL HIGH (ref 0.61–1.24)
GFR calc Af Amer: >60 mL/min (ref >60)
GFR calc non Af Amer: 58 mL/min — ABNORMAL LOW (ref >60)
Glucose, Bld: 99 mg/dL (ref 65–99)
Potassium: 4.6 mmol/L (ref 3.5–5.1)
Sodium: 133 mmol/L — ABNORMAL LOW (ref 135–145)

## 2016-09-19 LAB — CBC
HCT: 35.8 % — ABNORMAL LOW (ref 39.0–52.0)
Hemoglobin: 11 g/dL — ABNORMAL LOW (ref 13.0–17.0)
MCH: 26.2 pg (ref 26.0–34.0)
MCHC: 30.7 g/dL (ref 30.0–36.0)
MCV: 85.2 fL (ref 78.0–100.0)
Platelets: 295 10*3/uL (ref 150–400)
RBC: 4.2 MIL/uL — ABNORMAL LOW (ref 4.22–5.81)
RDW: 14.9 % (ref 11.5–15.5)
WBC: 15 10*3/uL — ABNORMAL HIGH (ref 4.0–10.5)

## 2016-09-19 MED ORDER — AMIODARONE HCL 200 MG PO TABS
400.0000 mg | ORAL_TABLET | Freq: Two times a day (BID) | ORAL | Status: AC
  Administered 2016-09-19: 400 mg via ORAL
  Filled 2016-09-19: qty 2

## 2016-09-19 MED ORDER — AMIODARONE HCL 200 MG PO TABS
200.0000 mg | ORAL_TABLET | Freq: Every day | ORAL | Status: DC
  Administered 2016-09-20 – 2016-09-21 (×2): 200 mg via ORAL
  Filled 2016-09-19 (×2): qty 1

## 2016-09-19 MED ORDER — LOSARTAN POTASSIUM 50 MG PO TABS
25.0000 mg | ORAL_TABLET | Freq: Every day | ORAL | Status: DC
  Administered 2016-09-20 – 2016-09-21 (×2): 25 mg via ORAL
  Filled 2016-09-19 (×2): qty 1

## 2016-09-19 NOTE — Progress Notes (Signed)
[place] PHYSICAL MEDICINE & REHABILITATION     PROGRESS NOTE  Subjective/Complaints:  Pt seen laying in bed this AM and later ambulating with therapies.  He slept well overnight.  He is looking forward to going home soon.  ROS: Denies CP, SOB, N/V/D.  Objective: Vital Signs: Blood pressure (!) 162/52, pulse 60, temperature 98 F (36.7 C), temperature source Oral, resp. rate 18, weight 112 kg (246 lb 14.6 oz), SpO2 96 %. No results found. No results for input(s): WBC, HGB, HCT, PLT in the last 72 hours.  Recent Labs  09/18/16 0521 09/19/16 0449  NA 137 133*  K 4.6 4.6  CL 103 101  GLUCOSE 105* 99  BUN 12 12  CREATININE 1.27* 1.25*  CALCIUM 10.0 10.1   CBG (last 3)   Recent Labs  09/18/16 1704 09/18/16 2023 09/19/16 0631  GLUCAP 108* 116* 95    Wt Readings from Last 3 Encounters:  09/19/16 112 kg (246 lb 14.6 oz)  09/15/16 116.6 kg (257 lb 1.6 oz)  03/02/14 133.8 kg (295 lb)    Physical Exam:  BP (!) 162/52 (BP Location: Right Arm)   Pulse 60   Temp 98 F (36.7 C) (Oral)   Resp 18   Wt 112 kg (246 lb 14.6 oz)   SpO2 96%   BMI 39.85 kg/m  Constitutional: He appears well-developed. Obese. NAD.  HENT: Normocephalic and atraumatic.  Eyes: EOM are normal. No discharge. Cardiovascular: RRR.  No JVD. Respiratory: Effort normal and breath sounds normal.  GI: Soft. Bowel sounds are normal.  Musculoskeletal: He exhibits no edema. He exhibits no tenderness.  Neurological: He is alert and oriented.  Motor: 4+-5/5 throughout (unchanged) Skin: Skin is warm and dry. No erythema.  Psychiatric: He has a normal mood and affect. His behavior is normal.   Assessment/Plan: 1. Functional deficits secondary to debility/acute encephalopathy which require 3+ hours per day of interdisciplinary therapy in a comprehensive inpatient rehab setting. Physiatrist is providing close team supervision and 24 hour management of active medical problems listed below. Physiatrist and  rehab team continue to assess barriers to discharge/monitor patient progress toward functional and medical goals.  Function:  Bathing Bathing position   Position: Shower  Bathing parts Body parts bathed by patient: Right arm, Left arm, Chest, Abdomen, Front perineal area, Buttocks, Right upper leg, Left upper leg, Right lower leg, Left lower leg    Bathing assist Assist Level: Supervision or verbal cues      Upper Body Dressing/Undressing Upper body dressing   What is the patient wearing?: Hospital gown                Upper body assist Assist Level: Set up      Lower Body Dressing/Undressing Lower body dressing   What is the patient wearing?: Underwear, Pants Underwear - Performed by patient: Thread/unthread right underwear leg, Thread/unthread left underwear leg, Pull underwear up/down   Pants- Performed by patient: Thread/unthread right pants leg, Thread/unthread left pants leg, Pull pants up/down           Shoes - Performed by patient: Don/doff right shoe, Don/doff left shoe            Lower body assist Assist for lower body dressing: Touching or steadying assistance (Pt > 75%)      Toileting Toileting   Toileting steps completed by patient: Adjust clothing prior to toileting, Performs perineal hygiene, Adjust clothing after toileting      Toileting assist Assist level: Supervision or verbal  cues   Transfers Chair/bed transfer   Chair/bed transfer method: Ambulatory Chair/bed transfer assist level: No Help, no cues, assistive device, takes more than a reasonable amount of time Chair/bed transfer assistive device: Armrests     Locomotion Ambulation     Max distance: 200 Assist level: No help, No cues, assistive device, takes more than a reasonable amount of time   Wheelchair          Cognition Comprehension Comprehension assist level: Follows basic conversation/direction with extra time/assistive device  Expression Expression assist level:  Expresses complex 90% of the time/cues < 10% of the time  Social Interaction Social Interaction assist level: Interacts appropriately 90% of the time - Needs monitoring or encouragement for participation or interaction.  Problem Solving Problem solving assist level: Solves basic 75 - 89% of the time/requires cueing 10 - 24% of the time  Memory Memory assist level: Recognizes or recalls 75 - 89% of the time/requires cueing 10 - 24% of the time     Medical Problem List and Plan: 1.  Poor endurance and safety awareness secondary to debility/acute encephalopathy.  Cont CIR 2.  DVT Prophylaxis/Anticoagulation: Pharmaceutical: Lovenox 3. Pain Management: N/A 4. Mood: Some anxiety noted--team to provide ego support. LCSW to follow for evaluation and support.  5. Neuropsych: This patient is not fully capable of making decisions on  his own behalf. 6. Skin/Wound Care: routine pressure relief measures  7. Fluids/Electrolytes/Nutrition: Monitor I/O. Supplements prn if intake poor.  8. NSVT: LifeVest in place.   Decrease amiodarone to 200 mg daily in a week (12/28).   ASA resumed.  Follow up with Dr. Curt Bears after discharge.  9. 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 Continue lasix, ace and BB.  Filed Weights   09/17/16 0522 09/18/16 0500 09/19/16 0510  Weight: 114 kg (251 lb 5.2 oz) 112.2 kg (247 lb 5.7 oz) 112 kg (246 lb 14.6 oz)   Stable 12/26 10 HTN: Monitor BID.   Continue coreg, lasix and Prinivil.   Monitor renal status and electrolytes closely.   ?Trending up in last 24 hours, will consider further adjustments tomorrow if persistently elelvated 11. Hypokalemia: Monitor lytes daily for now. EP recommends K+./=4.0 and Mg >/+2.0  K+ 4.6 on 12/25  Mag 2.1 on 12/25 12.  Diabetes: Hgb A1c- 5.5. Monitor BS ac/hs.   Controlled 12/26 13. OSA: does not use CPAP consistently at home.   Encourage compliance.  14. Citrobacter Bacteremia: Antibiotic day # 9/14 15.  Transaminitis: Question due to shocked liver  Medication history reviewed and discussed with pharmacy, no obvious new offending agents  Improving  Will cont to monitor 16. ABLA:   Hb 11.1 on 12/23  Labs remain pending  Cont to monitor 17. Leukocytosis  WBCs 11.8 on 12/23  Labs remain pending  Cont to monitor 18. Disorientation  Likely related sleep/wake  Ammonia WNL  Improving 19. Morbid obesity  Body mass index is 39.85 kg/m.  Diet and exercise education  Encourage weight loss to increase endurance and promote overall health 20. Likely CKD vs. AKI  Cr. 1.25 on 12/26  Encourage fluid intake  Will cont to monitor  LOS (Days) 4 A FACE TO FACE EVALUATION WAS PERFORMED  Mirranda Monrroy Lorie Phenix 09/19/2016 9:38 AM

## 2016-09-19 NOTE — Progress Notes (Signed)
Occupational Therapy Session Note  Patient Details  Name: Dean [name] MRN: [id number] Date of Birth: Jan 02, 1951  Today's Date: 09/19/2016 OT Individual Time: [id number] OT Individual Time Calculation (min): 30 min  and Today's Date: 09/19/2016 OT Missed Time: 39 Minutes Missed Time Reason: Patient fatigue     Short Term Goals: Week 1:  OT Short Term Goal 1 (Week 1): STG = LTG d/t short ELOS  Skilled Therapeutic Interventions/Progress Updates:    Pt in bathroom upon arrival.  Pt completed toileting tasks and amb without AD back into room to wash hands.  Pt stated he was exhausted from earlier therapies and lack of sleep the previous night.  Pt declined shower secondary to fatigue.  Reviewed OT LTGs and purpose of OT.  Continued discharge planning and home safety.  Pt agreeable to shower in the AM before discharge.  Pt missed 45 mins skilled OT services secondary to fatigue.  Pt remained at EOB with all needs within reach.   Therapy Documentation Precautions:  Precautions Precautions: Fall Precaution Comments: on lIfe Vest with cell phone Restrictions Weight Bearing Restrictions: No General: General OT Amount of Missed Time: 45 Minutes Pain: Pain Assessment Pain Assessment: No/denies pain  See Function Navigator for Current Functional Status.   Therapy/Group: Individual Therapy  Leroy Libman 09/19/2016, 10:01 AM

## 2016-09-19 NOTE — Progress Notes (Signed)
Speech Language Pathology Daily Session Notes  Patient Details  Name: Dean [name] MRN: [id number] Date of Birth: 1951-05-13  Today's Date: 09/19/2016  Session 1 SLP Individual Time: 0830-0900 SLP Individual Time Calculation (min): 30 min  Session 2 SLP Individual Time: [id number] SLP Individual Time Calculation (min): 28 min    Short Term Goals:Week 1: SLP Short Term Goal 1 (Week 1): STG=LTG due to ELOS   Skilled Therapeutic Interventions: Session 1 Skilled treatment session focused on addressing cognition goals. SLP facilitated session by providing Min faded to Supervision assist to utilize external aid to recall functions and frequencies of current medications patient also required Min verbal cues to recognize errors and correct.  Plan for 24/7 Supervision upon discharge and need family education to be initiated, will discuss with team. Continue with current plan of care.   Session 2 Skilled treatment session focused on addressing cognition goals. SLP facilitated session by providing handout and verbal instruction regarding home management strategies for memory/recall.  Patient was able to provide an example of 4/4 with Min assist question cues.  Plan for family education at next visit; patient aware. Continue with current plan of care.   Function:  Cognition Comprehension Comprehension assist level: Follows basic conversation/direction with no assist  Expression   Expression assist level: Expresses complex 90% of the time/cues < 10% of the time  Social Interaction Social Interaction assist level: Interacts appropriately with others with medication or extra time (anti-anxiety, antidepressant).  Problem Solving Problem solving assist level: Solves basic 90% of the time/requires cueing < 10% of the time  Memory Memory assist level: Recognizes or recalls 75 - 89% of the time/requires cueing 10 - 24% of the time    Pain Pain Assessment Pain Assessment: No/denies pain  x2  Therapy/Group: Individual Therapy x2  Carmelia Roller., [place] [id number]  [place] 09/19/2016, 12:32 PM

## 2016-09-19 NOTE — Progress Notes (Deleted)
Social Work Patient ID: Dean [name], male   DOB: [birth year], 65 y.o.   MRN: [id number]  Met with pt, ex-wife and daughter (24) today to review team conference and discuss d/c plans.  All aware pt set for d/c 12/30.  Explained to ex-wife, Dean [name], that I understood from pt that plan is to d/c to her home.  She initially did not respond and simply stared back but then reports "we had talked about that but I wasn't aware that it was a definite plan."  She asked for general review of his care needs, which I provided and then asked that they be allowed to discuss among themselves.  Followed by up later and they all report they are agreed with d/c plan to go to her home.  We have scheduled for family ed to take place Thurs 12/28 and will discuss further with pt and ex-wife that day.  Continue to follow.  Darria Corvera, LCSW

## 2016-09-19 NOTE — Progress Notes (Addendum)
Physical Therapy Discharge Summary  Patient Details  Name: Dean [name] MRN: [id number] Date of Birth: [birth year]  Today's Date: 09/19/2016 PT Individual Time: 0730-0825 PT Individual Time Calculation (min): 55 min   Pt performed gait in room with mod I including bathroom mobility and toilet transfers. Pt able to perform upper body dressing with cues for safety with life vest. Pt able to perform gait throughout unity with mod I with SPC including uneven surfaces, ramp and stair negotiation.  Car transfers with mod I to simulated sedan height car.  Gait performed multiple reps for activity tolerance with pt fatiguing after 2 minutes of gait. PT educated pt on energy conservation, importance of continuing exercise and recommendation for HHPT, pt verbalizes understanding. Pt still requires cues to remember life vest battery at all times when performing mobility, pt states his wife will provide supervision at home.  Patient has met 11 of 11 long term goals due to improved activity tolerance, improved balance, ability to compensate for deficits and improved awareness.  Patient to discharge at an ambulatory level Modified Independent.   Pt able to perform gait and transfers at mod I level using SPC. Pt limited by decreased activity tolerance and cardiovascular endurance but with improving safety awareness.  Pt educated on energy conservation and importance of continuing exercise at home.   Reasons goals not met: n/a  Recommendation:  Patient will benefit from ongoing skilled PT services in home health setting to continue to advance safe functional mobility, address ongoing impairments in endurance, balance, gait, and minimize fall risk.  Equipment: [facility]  Reasons for discharge: treatment goals met and discharge from hospital  Patient/family agrees with progress made and goals achieved: Yes  PT Discharge Precautions/RestrictionsPrecautions Precautions: Fall Precaution Comments: on lIfe Vest  with cell phone Restrictions Weight Bearing Restrictions: No Pain Pain Assessment Pain Assessment: No/denies pain  Cognition Attention: Selective Selective Attention: Appears intact Memory: Impaired Memory Impairment: Retrieval deficit Awareness: Impaired Awareness Impairment: Anticipatory impairment Comments: slightly impulsive, poor awareness of life vest Sensation Sensation Light Touch: Appears Intact Proprioception: Appears Intact Coordination Gross Motor Movements are Fluid and Coordinated: Yes Fine Motor Movements are Fluid and Coordinated: Yes Motor  Motor Motor - Discharge Observations: generalized weakness   Trunk/Postural Assessment  Cervical Assessment Cervical Assessment: Within Functional Limits Thoracic Assessment Thoracic Assessment: Within Functional Limits Lumbar Assessment Lumbar Assessment: Within Functional Limits Postural Control Postural Control: Within Functional Limits  Balance Static Standing Balance Static Standing - Level of Assistance: 6: Modified independent (Device/Increase time) Dynamic Standing Balance Dynamic Standing - Level of Assistance: 6: Modified independent (Device/Increase time) Extremity Assessment      RLE Assessment RLE Assessment: Within Functional Limits LLE Assessment LLE Assessment: Within Functional Limits   See Function Navigator for Current Functional Status.  Linet Brash 09/19/2016, 8:16 AM

## 2016-09-19 NOTE — Progress Notes (Signed)
Per 12/22 note: patient can be transitioned to bactrim at discharge.  Patient reports intolerance of lisinopril in the past "all my family is allergic" and reporting cough due to this. He is agreeable to change to cozaar.

## 2016-09-19 NOTE — Care Management Note (Signed)
[facility] Individual Statement of Services  Patient Name:  Dean [name]  Date:  09/19/2016  Welcome to the [facility].  Our goal is to provide you with an individualized program based on your diagnosis and situation, designed to meet your specific needs.  With this comprehensive rehabilitation program, you will be expected to participate in at least 3 hours of rehabilitation therapies Monday-Friday, with modified therapy programming on the weekends.  Your rehabilitation program will include the following services:  Physical Therapy (PT), Occupational Therapy (OT), Speech Therapy (ST), 24 hour per day rehabilitation nursing, Therapeutic Recreaction (TR), Neuropsychology, Case Management (Social Worker), Rehabilitation Medicine, Nutrition Services and Pharmacy Services  Weekly team conferences will be held on Tuesdays to discuss your progress.  Your Social Worker will talk with you frequently to get your input and to update you on team discussions.  Team conferences with you and your family in attendance may also be held.  Expected length of stay: 3-5 days  Overall anticipated outcome: supervision  Depending on your progress and recovery, your program may change. Your Social Worker will coordinate services and will keep you informed of any changes. Your Social Worker's name and contact numbers are listed  below.  The following services may also be recommended but are not provided by the [facility] will be made to provide these services after discharge if needed.  Arrangements include referral to agencies that provide these services.  Your insurance has been verified to be:  [place] [employer] Your primary doctor is:  Dr. Abbott Pao  Pertinent information will be shared with your doctor and  your insurance company.  Social Worker:  [place], [place] or ([phone]   Information discussed with and copy given to patient by: Lennart Pall, 09/19/2016, 3:09 PM

## 2016-09-19 NOTE — Progress Notes (Signed)
Social Work  Social Work Assessment and Plan  Patient Details  Name: Dean [name] MRN: [id number] Date of Birth: Feb 08, 1951  Today's Date: 09/18/2016  Problem List:  Patient Active Problem List   Diagnosis Date Noted  . Stage 3 chronic kidney disease   . AKI (acute kidney injury) ([place])   . Acute systolic congestive heart failure ([place])   . Disorientated   . Bacteremia   . Anoxic brain injury ([place]) 09/15/2016  . Benign essential HTN   . OSA (obstructive sleep apnea)   . Transaminitis   . Acute lower UTI   . Bacteremia due to Gram-negative bacteria   . Chronic respiratory failure with hypoxia ([place])   . Fever   . Acute cystitis without hematuria   . NSVT (nonsustained ventricular tachycardia) ([place])   . Uncomplicated asthma   . Diabetes mellitus type 2 in obese ([place])   . Acute on chronic combined systolic and diastolic CHF (congestive heart failure) ([place])   . Tachypnea   . Hypernatremia   . Hypokalemia   . Leukocytosis   . Acute blood loss anemia   . Hypoxemia   . Acute respiratory failure ([place])   . Acute encephalopathy   . Cardiopulmonary arrest ([place]) 08/27/2016  . DM2 (diabetes mellitus, type 2) ([place]) 03/02/2014  . Morbid obesity ([place]) 03/02/2014  . Essential hypertension, benign 03/02/2014   Past Medical History:  Past Medical History:  Diagnosis Date  . Arthritis    "maybe in his spine" (09/05/2016)  . Asthma   . Cardiac arrest ([place]) 08/27/2016   REQUIRING CPR, SHOCK & MEDICATIONS  . Gout   . High cholesterol   . Hypertension   . OSA on CPAP   . Type II diabetes mellitus ([place])    Past Surgical History:  Past Surgical History:  Procedure Laterality Date  . CARDIAC CATHETERIZATION N/A 09/07/2016   Procedure: Left Heart Cath and Coronary Angiography;  Surgeon: Peter M Martinique, MD;  Location: [facility];  Service: Cardiovascular;  Laterality: N/A;  . NO PAST SURGERIES     Social History:  reports that he has never smoked. He has never used smokeless  tobacco. He reports that he does not drink alcohol or use drugs.  Family / Support Systems Marital Status: Married Patient Roles: Spouse, Parent Spouse/Significant Other: wife, Broc Parks @ [phone]) [phone] Children: daughter, Elnatan Haake @ [phone] Other Supports: pt has total of 5 adult children with one son (with Down's Syndrome) living in the home. Anticipated Caregiver: wife, daughter Ability/Limitations of Caregiver: Wife can assist.  Sons with Downs syndrome.  Dtr works. Caregiver Availability: 24/7 Family Dynamics: Pt describes family as very supportive and denies any concerns about available help  Social History Preferred language: English Religion: Holiness Cultural Background: NA Read: Yes Write: Yes Employment Status: Disabled Date Retired/Disabled/Unemployed: 2011, however, does "pick up some work" in Publishing rights manager Issues: None Guardian/Conservator: None - per MD, pt is not fully capable of making decisions on his own behalf - defer to wife   Abuse/Neglect Physical Abuse: Denies Verbal Abuse: Denies Sexual Abuse: Denies Exploitation of patient/patient's resources: Denies Self-Neglect: Denies  Emotional Status Pt's affect, behavior adn adjustment status: Pt very pleasant and talkative.  He describes feeling like "my mind is coming back....but I can't remember the day that all this happened."  He denies any emotional distress, however, may benefit from a consult for neuropsychology for further cognitive screening. 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 Anticipated changes in roles/activities/participation: Anticipated that pt will need supervision overall and wife prepared to assume caregiver role. Pt/family  expectations/goals: "I just want to get straight."  [employer]: None Premorbid Home Care/DME Agencies: None Transportation available at discharge: yes Resource referrals recommended: Neuropsychology  Discharge Planning Living Arrangements: Spouse/significant other, Children Support Systems: Spouse/significant other, Children Type of Residence: Private residence Insurance Resources: Medicare (*[place] Medicare) Financial Resources: Radio broadcast assistant Screen Referred: No Living Expenses: Own Money Management: Spouse Does the patient have any problems obtaining your medications?: No Home Management: pt and family Patient/Family Preliminary Plans: Pt to return home with family able to provide 24/7 assistance Social Work Anticipated Follow Up Needs: HH/OP Expected length of stay: 3-5 days  Clinical Impression Pleasant gentleman here following cardiac arrest and with resulting anoxic brain injury.  Able to complete assessment interview without difficulty and reports "my head seems to be coming back together... I just can't remember the day it happened."  Denies any significant emotional distress.  Family very supportive and able to provide recommended 24/7 supervision.  Anticipating short LOS.  Roy Tokarz 09/18/2016, 3:05 PM

## 2016-09-20 ENCOUNTER — Inpatient Hospital Stay (HOSPITAL_COMMUNITY): Payer: Medicare Other | Admitting: Speech Pathology

## 2016-09-20 ENCOUNTER — Inpatient Hospital Stay (HOSPITAL_COMMUNITY): Payer: Medicare Other | Admitting: Physical Therapy

## 2016-09-20 ENCOUNTER — Inpatient Hospital Stay (HOSPITAL_COMMUNITY): Payer: Medicare Other

## 2016-09-20 LAB — GLUCOSE, CAPILLARY
Glucose-Capillary: 90 mg/dL (ref 65–99)
Glucose-Capillary: 101 mg/dL — ABNORMAL HIGH (ref 65–99)
Glucose-Capillary: 99 mg/dL (ref 65–99)
Glucose-Capillary: 95 mg/dL (ref 65–99)

## 2016-09-20 MED ORDER — SULFAMETHOXAZOLE-TRIMETHOPRIM 800-160 MG PO TABS
1.0000 | ORAL_TABLET | Freq: Two times a day (BID) | ORAL | Status: DC
  Administered 2016-09-20 – 2016-09-21 (×3): 1 via ORAL
  Filled 2016-09-20 (×4): qty 1

## 2016-09-20 NOTE — Patient Care Conference (Signed)
Inpatient RehabilitationTeam Conference and Plan of Care Update Date: 09/19/2016   Time: 10:35 AM    Patient Name: Dean [name]      Medical Record Number: [id number]  Date of Birth: [birth year] Sex: Male         Room/Bed: 4W19C/4W19C-01 Payor Info: Payor: Holley Bouche MEDICARE / Plan: [employer] MEDICARE / Product Type: *No Product type* /    Admitting Diagnosis: Debility  Admit Date/Time:  09/15/2016  5:16 PM Admission Comments: No comment available   Primary Diagnosis:  <principal problem not specified> Principal Problem: <principal problem not specified>  Patient Active Problem List   Diagnosis Date Noted  . Stage 3 chronic kidney disease   . AKI (acute kidney injury) ([place])   . Acute systolic congestive heart failure ([place])   . Disorientated   . Bacteremia   . Anoxic brain injury ([place]) 09/15/2016  . Benign essential HTN   . OSA (obstructive sleep apnea)   . Transaminitis   . Acute lower UTI   . Bacteremia due to Gram-negative bacteria   . Chronic respiratory failure with hypoxia ([place])   . Fever   . Acute cystitis without hematuria   . NSVT (nonsustained ventricular tachycardia) ([place])   . Uncomplicated asthma   . Diabetes mellitus type 2 in obese ([place])   . Acute on chronic combined systolic and diastolic CHF (congestive heart failure) ([place])   . Tachypnea   . Hypernatremia   . Hypokalemia   . Leukocytosis   . Acute blood loss anemia   . Hypoxemia   . Acute respiratory failure ([place])   . Acute encephalopathy   . Cardiopulmonary arrest ([place]) 08/27/2016  . DM2 (diabetes mellitus, type 2) ([place]) 03/02/2014  . Morbid obesity ([place]) 03/02/2014  . Essential hypertension, benign 03/02/2014    Expected Discharge Date: Expected Discharge Date: 09/21/16  Team Members Present: Physician leading conference: Dr. Delice Lesch Social Worker Present: Lennart Pall, LCSW Nurse Present: Heather Roberts, RN PT Present: Lavone Nian, PT;Other (comment) Roderic Ovens, PT) OT  Present: Willeen Cass, OT;Roanna Epley, COTA SLP Present: Gunnar Fusi, SLP PPS Coordinator present : Ileana Ladd, PT     Current Status/Progress Goal Weekly Team Focus  Medical   Poor endurance and safety awareness secondary to debility/acute encephalopathy  Improve chronic meds, safety, cognition, mobility  See above   Bowel/Bladder   Continent of B/B. LBM 09/17/2016   Pt to maintain level of continence  Offer timed toileting to maintain urinary continence; monitor for changes in B/B status   Swallow/Nutrition/ Hydration   WDL         ADL's   supervision/steady A overall  mod I overall  activity tolerance, functional amb without AD, family education, discharge planning   Mobility   supervision  mod I  d/c planning   Communication             Safety/Cognition/ Behavioral Observations  Supervision-Min A  Supervision   recall and awareness and self-monitoring and educaiton    Pain   Pt continues to deny pain  0  Maintain pt pain level   Skin   no breakdown in skin integrity or infection; soft, quarter-sized nodule to R buttocks that pt stated has been there for a long time  Pt will maintain skin free from breakdown and infection  Monitor for changes in skin integrity and S/S of infection    Rehab Goals Patient on target to meet rehab goals: Yes *See Care Plan and progress notes for long and short-term goals.  Barriers to Discharge: NSVT, HTN, leukocytosis, cognition, bacteremia    Possible Resolutions to Barriers:  Wean cardiac meds, follow BP, cont abx, leukocytosis    Discharge Planning/Teaching Needs:  Home with family to provide 24/7 supervision.  Teaching needs TBD   Team Discussion:  Continuing to reduce card meds. Plan to change abx to po at d/c.  Reaching supervision goals and will need 24/7 care.  Need to complete family ed.  SW to follow up  Revisions to Treatment Plan:  None   Continued Need for Acute Rehabilitation Level of Care: The patient requires  daily medical management by a physician with specialized training in physical medicine and rehabilitation for the following conditions: Daily direction of a multidisciplinary physical rehabilitation program to ensure safe treatment while eliciting the highest outcome that is of practical value to the patient.: Yes Daily medical management of patient stability for increased activity during participation in an intensive rehabilitation regime.: Yes Daily analysis of laboratory values and/or radiology reports with any subsequent need for medication adjustment of medical intervention for : Cardiac problems;Neurological problems  Dean [name] 09/20/2016, 9:40 AM

## 2016-09-20 NOTE — Progress Notes (Signed)
Occupational Therapy Session Note  Patient Details  Name: Dean [name] MRN: [id number] Date of Birth: 1950-12-01  Today's Date: 09/20/2016 OT Individual Time: [id number] OT Individual Time Calculation (min): 55 min     Short Term Goals: Week 1:  OT Short Term Goal 1 (Week 1): STG = LTG d/t short ELOS  Skilled Therapeutic Interventions/Progress Updates:    Pt resting in w/c upon arrival with wife present for family education.  Pt, wife, and RN stated that he completed shower and dressing earlier in the morning.  Pt and wife reported that pt did not require any assistance to complete tasks.  Pt amb with SPC to ADL apartment and practiced walk-in shower transfers and bed mobility.  Recommended that pt's wife be present when pt transfers in/out of shower.  Pt and wife verbalized understanding.  Continued discharge planning and discussed home safety recommendations.  Pt and wife pleased with progress and ready for discharge tomorrow.  Pt returned to room and remained in w/c awaiting next therapy.  Therapy Documentation Precautions:  Precautions Precautions: Fall Precaution Comments: on lIfe Vest with cell phone Restrictions Weight Bearing Restrictions: No Pain:  Pt denied pain  See Function Navigator for Current Functional Status.   Therapy/Group: Individual Therapy  Leroy Libman 09/20/2016, 9:58 AM

## 2016-09-20 NOTE — Progress Notes (Signed)
Speech Language Pathology Session Note & Discharge Summary  Patient Details  Name: Dean [name] MRN: [id number] Date of Birth: 1950/10/22  Today's Date: 09/20/2016 SLP Individual Time: 1000-1055 SLP Individual Time Calculation (min): 55 min   Skilled Therapeutic Interventions:  Skilled treatment session focused on cognitive goals. SLP facilitated session by re-administering the MoCA (version 7.3) and scored 21/30 points with a score of 26 or above considered normal. Patient continues to demonstrate deficits in the areas of recall and organization. Patient's wife present and educated in regards to strategies to utilize at home to maximize recall and carryover of information as well as overall safety and functional independence. Patient and his wife verbalized understanding. Patient left supine with family present and all needs within reach. Continue with current plan of care.   Patient has met 1 of 4 long term goals.  Patient to discharge at overall Min;Supervision level.   Reasons goals not met: Patient requires overall Min A cues for cognitive tasks. Goals not met due to a short length of stay    Clinical Impression/Discharge Summary: Patient has made minimal gains and has met 1 of 4 LTG's this admission due to a short length of stay. Currently, patient requires overall Min A verbal cues to complete functional and mildly complex tasks in regards to problem solving, emergent awareness and recall. Patient also requires overall supervision verbal cues for selective attention to tasks in a mildly distracting environment. Patient and family education is complete and patient will discharge home with 24 hour supervision from family. Patient would benefit from f/u SLP services to maximize cognitive function and overall functional independence in order to reduce caregiver burden.   Care Partner:  Caregiver Able to Provide Assistance: Yes  Type of Caregiver Assistance:  Physical;Cognitive  Recommendation:  Home Health SLP;24 hour supervision/assistance  Rationale for SLP Follow Up: Maximize cognitive function and independence;Reduce caregiver burden   Equipment: N/A   Reasons for discharge: Discharged from hospital   Patient/Family Agrees with Progress Made and Goals Achieved: Yes   Function:   Cognition Comprehension Comprehension assist level: Follows basic conversation/direction with no assist  Expression   Expression assist level: Expresses complex 90% of the time/cues < 10% of the time  Social Interaction Social Interaction assist level: Interacts appropriately with others with medication or extra time (anti-anxiety, antidepressant).  Problem Solving Problem solving assist level: Solves basic 90% of the time/requires cueing < 10% of the time  Memory Memory assist level: Recognizes or recalls 75 - 89% of the time/requires cueing 10 - 24% of the time   [name] 09/20/2016, 3:26 PM

## 2016-09-20 NOTE — Progress Notes (Signed)
[facility] PHYSICAL MEDICINE & REHABILITATION     PROGRESS NOTE  Subjective/Complaints:  No new issues.   ROS: pt denies nausea, vomiting, diarrhea, cough, shortness of breath or chest pain  Objective: Vital Signs: Blood pressure 131/69, pulse 60, temperature 97.9 F (36.6 C), temperature source Oral, resp. rate 18, weight 112 kg (246 lb 14.6 oz), SpO2 98 %. No results found.  Recent Labs  09/19/16 1023  WBC 15.0*  HGB 11.0*  HCT 35.8*  PLT 295    Recent Labs  09/18/16 0521 09/19/16 0449  NA 137 133*  K 4.6 4.6  CL 103 101  GLUCOSE 105* 99  BUN 12 12  CREATININE 1.27* 1.25*  CALCIUM 10.0 10.1   CBG (last 3)   Recent Labs  09/19/16 1649 09/19/16 2047 09/20/16 0628  GLUCAP 92 115* 90    Wt Readings from Last 3 Encounters:  09/20/16 112 kg (246 lb 14.6 oz)  09/15/16 116.6 kg (257 lb 1.6 oz)  03/02/14 133.8 kg (295 lb)    Physical Exam:  BP 131/69 (BP Location: Right Arm)   Pulse 60   Temp 97.9 F (36.6 C) (Oral)   Resp 18   Wt 112 kg (246 lb 14.6 oz)   SpO2 98%   BMI 39.85 kg/m  Constitutional: He appears well-developed. Obese. NAD.  HENT: Normocephalic and atraumatic.  Eyes: EOM are normal. No discharge. Cardiovascular: RRR.  No JVD. Respiratory: chest clear.  GI: Soft. Bowel sounds are normal.  Musculoskeletal: He exhibits no edema. He exhibits no tenderness.  Neurological: He is alert and oriented.  Motor: 4+-5/5 throughout (unchanged) Skin: Skin is warm and dry. No erythema.  Psychiatric: He has a normal mood and affect. His behavior is normal.   Assessment/Plan: 1. Functional deficits secondary to debility/acute encephalopathy which require 3+ hours per day of interdisciplinary therapy in a comprehensive inpatient rehab setting. Physiatrist is providing close team supervision and 24 hour management of active medical problems listed below. Physiatrist and rehab team continue to assess barriers to discharge/monitor patient progress toward  functional and medical goals.  Function:  Bathing Bathing position   Position: Shower  Bathing parts Body parts bathed by patient: Right arm, Left arm, Chest, Abdomen, Front perineal area, Buttocks, Right upper leg, Left upper leg, Right lower leg, Left lower leg    Bathing assist Assist Level: More than reasonable time (per RN/NT report)      Upper Body Dressing/Undressing Upper body dressing   What is the patient wearing?: Pull over shirt/dress     Pull over shirt/dress - Perfomed by patient: Thread/unthread right sleeve, Thread/unthread left sleeve, Put head through opening          Upper body assist Assist Level: No help, No cues      Lower Body Dressing/Undressing Lower body dressing   What is the patient wearing?: Underwear, Pants, Socks, Shoes Underwear - Performed by patient: Thread/unthread right underwear leg, Thread/unthread left underwear leg, Pull underwear up/down   Pants- Performed by patient: Thread/unthread right pants leg, Thread/unthread left pants leg, Pull pants up/down       Socks - Performed by patient: Don/doff right sock, Don/doff left sock   Shoes - Performed by patient: Don/doff right shoe, Don/doff left shoe Shoes - Performed by helper: Don/doff right shoe, Don/doff left shoe          Lower body assist Assist for lower body dressing: More than reasonable time      Toileting Toileting   Toileting steps completed by  patient: Adjust clothing prior to toileting, Performs perineal hygiene, Adjust clothing after toileting Toileting steps completed by helper: Adjust clothing prior to toileting, Performs perineal hygiene, Adjust clothing after toileting (per Elmo Putt, NT report)    Toileting assist Assist level: More than reasonable time   Transfers Chair/bed transfer   Chair/bed transfer method: Ambulatory Chair/bed transfer assist level: No Help, no cues, assistive device, takes more than a reasonable amount of time Chair/bed transfer  assistive device: Armrests     Locomotion Ambulation     Max distance: 200 Assist level: No help, No cues, assistive device, takes more than a reasonable amount of time   Wheelchair          Cognition Comprehension Comprehension assist level: Follows basic conversation/direction with no assist  Expression Expression assist level: Expresses complex 90% of the time/cues < 10% of the time  Social Interaction Social Interaction assist level: Interacts appropriately with others with medication or extra time (anti-anxiety, antidepressant).  Problem Solving Problem solving assist level: Solves basic 90% of the time/requires cueing < 10% of the time  Memory Memory assist level: Recognizes or recalls 75 - 89% of the time/requires cueing 10 - 24% of the time     Medical Problem List and Plan: 1.  Poor endurance and safety awareness secondary to debility/acute encephalopathy.  Cont CIR  -set for DC tomorrow 2.  DVT Prophylaxis/Anticoagulation: Pharmaceutical: Lovenox 3. Pain Management: N/A 4. Mood: Some anxiety noted--team to provide ego support. LCSW to follow for evaluation and support.  5. Neuropsych: This patient is not fully capable of making decisions on  his own behalf. 6. Skin/Wound Care: routine pressure relief measures  7. Fluids/Electrolytes/Nutrition: Monitor I/O. Supplements prn if intake poor.  8. NSVT: LifeVest in place.   Decrease amiodarone to 200 mg daily on 12/28.   ASA resumed.  Follow up with Dr. Curt Bears after discharge.  9. 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 Continue lasix, ace and BB.  Filed Weights   09/18/16 0500 09/19/16 0510 09/20/16 0521  Weight: 112.2 kg (247 lb 5.7 oz) 112 kg (246 lb 14.6 oz) 112 kg (246 lb 14.6 oz)   Stable 12/26 10 HTN: Monitor BID.   Continue coreg, lasix and Prinivil.   Monitor renal status and electrolytes closely.   -improvement this morning---continue to monitor 11. Hypokalemia: Monitor lytes  daily for now. EP recommends K+./=4.0 and Mg >/+2.0  K+ 4.6 on 12/25  Mag 2.1 on 12/25 12.  Diabetes: Hgb A1c- 5.5. Monitor BS ac/hs.   Controlled 12/26 13. OSA: does not use CPAP consistently at home.   Encourage compliance.  14. Citrobacter Bacteremia: Antibiotic day # 10/14  -switch to oral agent today  -recheck cbc tomorrow 15. Transaminitis: Question due to shocked liver  Medication history reviewed and discussed with pharmacy, no obvious new offending agents  Improving  Will cont to monitor 16. ABLA:   Hb 11.0 on 12/26     Cont to monitor 17. Leukocytosis  WBCs 15k on 12/26  -trending up  -recheck in AM 18. Disorientation  Likely related sleep/wake  Ammonia WNL  Improving 19. Morbid obesity  Body mass index is 39.85 kg/m.  Diet and exercise education  Encourage weight loss to increase endurance and promote overall health 20. Likely CKD vs. AKI  Cr. 1.25 on 12/26  Encourage fluid intake  Will cont to monitor  LOS (Days) 5 A FACE TO FACE EVALUATION WAS PERFORMED  Linzee Depaul T 09/20/2016 10:37 AM

## 2016-09-20 NOTE — Progress Notes (Signed)
Occupational Therapy Discharge Summary  Patient Details  Name: Dean [name] MRN: [id number] Date of Birth: 11/24/1950  Patient has met 10 of 10 long term goals due to improved activity tolerance, improved balance, ability to compensate for deficits and improved coordination.  Pt made excellent progress with BADLs during this admission and completes all tasks at mod I level.  Recommended supervision with shower transfers and 24 hour supervision after discharge secondary to memory deficits.  Pt's wife has been present and participated in therapy and has verbalized understanding of recommendations.  Patient to discharge at overall Supervision level.  Patient's care partner is independent to provide the necessary cognitive assistance at discharge.     Recommendation:  Patient will benefit from ongoing skilled OT services in home health setting to continue to advance functional skills in the area of BADL, iADL and Reduce care partner burden.  Equipment: shower seat  Reasons for discharge: treatment goals met and discharge from hospital  Patient/family agrees with progress made and goals achieved: Yes  OT Discharge Vision/Perception  Vision- History Baseline Vision/History: No visual deficits Patient Visual Report: No change from baseline Vision- Assessment Vision Assessment?: No apparent visual deficits  Cognition Overall Cognitive Status: Impaired/Different from baseline Arousal/Alertness: Awake/alert Orientation Level: Oriented X4 Attention: Selective Selective Attention: Appears intact Memory: Impaired Memory Impairment: Retrieval deficit Awareness: Impaired Awareness Impairment: Anticipatory impairment Problem Solving: Appears intact Problem Solving Impairment: Functional basic Safety/Judgment: Impaired Sensation Sensation Light Touch: Appears Intact Stereognosis: Appears Intact Hot/Cold: Appears Intact Proprioception: Appears Intact Coordination Gross Motor Movements  are Fluid and Coordinated: Yes Fine Motor Movements are Fluid and Coordinated: Yes Motor  Motor Motor: Within Functional Limits  Trunk/Postural Assessment  Cervical Assessment Cervical Assessment: Within Functional Limits Thoracic Assessment Thoracic Assessment: Within Functional Limits Lumbar Assessment Lumbar Assessment: Within Functional Limits Postural Control Postural Control: Within Functional Limits  Balance Balance Balance Assessed: Yes Standardized Balance Assessment Standardized Balance Assessment: Berg Balance Test Berg Balance Test Sit to Stand: Able to stand without using hands and stabilize independently Standing Unsupported: Able to stand safely 2 minutes Sitting with Back Unsupported but Feet Supported on Floor or Stool: Able to sit safely and securely 2 minutes Stand to Sit: Sits safely with minimal use of hands Transfers: Able to transfer safely, minor use of hands Standing Unsupported with Eyes Closed: Able to stand 10 seconds safely Standing Ubsupported with Feet Together: Able to place feet together independently and stand 1 minute safely From Standing, Reach Forward with Outstretched Arm: Can reach confidently >25 cm (10") From Standing Position, Pick up Object from Floor: Able to pick up shoe safely and easily From Standing Position, Turn to Look Behind Over each Shoulder: Looks behind from both sides and weight shifts well Turn 360 Degrees: Able to turn 360 degrees safely one side only in 4 seconds or less (~6 seconds to turn to each side) Standing Unsupported, Alternately Place Feet on Step/Stool: Able to stand independently and safely and complete 8 steps in 20 seconds Standing Unsupported, One Foot in Front: Able to plae foot ahead of the other independently and hold 30 seconds (can stand tandem for 15 seconds) Standing on One Leg: Able to lift leg independently and hold 5-10 seconds Total Score: 53 Static Sitting Balance Static Sitting - Level of  Assistance: 7: Independent Dynamic Sitting Balance Dynamic Sitting - Level of Assistance: 6: Modified independent (Device/Increase time) Extremity/Trunk Assessment RUE Assessment RUE Assessment: Within Functional Limits LUE Assessment LUE Assessment: Within Functional Limits   See Function Navigator for   Current Functional Status.  Lanier, Thomas Chappell 09/20/2016, 2:50 PM  

## 2016-09-20 NOTE — Progress Notes (Signed)
Patient information reviewed and entered into eRehab system by Ruthellen Tippy, RN, CRRN, PPS Coordinator.  Information including medical coding and functional independence measure will be reviewed and updated through discharge.     Per nursing patient was given "Data Collection Information Summary for Patients in Inpatient Rehabilitation Facilities with attached "Privacy Act Statement-Health Care Records" upon admission.  

## 2016-09-20 NOTE — Progress Notes (Signed)
Social Work Patient ID: Dean [name], male   DOB: [birth year], 65 y.o.   MRN: [id number]   Met with pt and daughter following team conference yesterday and spoke with wife via phone.  All aware of target dc tomorrow and supervision goals.  Wife in this morning as requested to complete family education.  All pleased with progress and feeling ready for d/c.  Lincoln Kleiner, LCSW

## 2016-09-20 NOTE — Progress Notes (Signed)
Called Life Vest support spk to [employer] and he confirmed that pt is to have 2 life vest.  He will have rep come out for additional teaching and to answer questions concerning vest.  Will inform pt and family.  Should be prior to d/c tomorrow.

## 2016-09-20 NOTE — Progress Notes (Signed)
Occupational Therapy Note  Patient Details  Name: Wynn Pociask MRN: [id number] Date of Birth: 1951-08-28  Today's Date: 09/20/2016 OT Individual Time: 1400-1425.   OT Individual Time Calculation (min): 25 min    Pt denied pain Individual therapy  Pt resting in bed upon arrival.  Pt requested to use toilet and amb without AD to bathroom.  Pt completed all tasks without assistance and no unsafe behaviors noted.  Continued discharge planning. Pt please with progress and ready for discharge tomorrow.   Leotis Shames [facility] 09/20/2016, 2:38 PM

## 2016-09-20 NOTE — Progress Notes (Signed)
Physical Therapy Session Note  Patient Details  Name: Dean [name] MRN: [id number] Date of Birth: 15-Nov-1950  Today's Date: 09/20/2016 PT Individual Time: 1102-1158 PT Individual Time Calculation (min): 56 min    Short Term Goals: Week 1:  PT Short Term Goal 1 (Week 1): = LTGs due to ELOS  Skilled Therapeutic Interventions/Progress Updates:    Pt received in bed with wife present for session for caregiver training. Pt denied c/o pain. Session focused on grad day activities; pt ambulates with SPC over uneven & even surfaces with supervision, performs sit<>stand transfers with mod I, car transfers with supervision & SPC, negotiates 24 steps (6" + 3") with B rails & supervision. Educated pt's wife on positioning of herself in relation to pt when negotiating stairs, as well as management of SPC. Educated pt & wife on home modifications (remove throw rugs) for safety, as well as walking to increase his endurance. Pt completed Berg Balance Test & score 53/56; educated pt & wife on interpretation of score. Patient demonstrates increased fall risk as noted by score of 53/56 on Berg Balance Scale.  (<36= high risk for falls, close to 100%; 37-45 significant >80%; 46-51 moderate >50%; 52-55 lower >25%). Pt utilize nu-step level 3 x 10 minutes with BLE only for endurance training. At end of session pt left sitting in recliner in room with wife present & set up with lunch tray.   Pt's wife return demonstrated assisting pt with ambulation & stair negotiation; she was able to instruct pt on safety with mobility & management of life vest device.   Therapy Documentation Precautions:  Precautions Precautions: Fall Precaution Comments: on lIfe Vest with cell phone Restrictions Weight Bearing Restrictions: No  Balance: Balance Balance Assessed: Yes Standardized Balance Assessment Standardized Balance Assessment: Berg Balance Test Berg Balance Test Sit to Stand: Able to stand without using hands and  stabilize independently Standing Unsupported: Able to stand safely 2 minutes Sitting with Back Unsupported but Feet Supported on Floor or Stool: Able to sit safely and securely 2 minutes Stand to Sit: Sits safely with minimal use of hands Transfers: Able to transfer safely, minor use of hands Standing Unsupported with Eyes Closed: Able to stand 10 seconds safely Standing Ubsupported with Feet Together: Able to place feet together independently and stand 1 minute safely From Standing, Reach Forward with Outstretched Arm: Can reach confidently >25 cm (10") From Standing Position, Pick up Object from Floor: Able to pick up shoe safely and easily From Standing Position, Turn to Look Behind Over each Shoulder: Looks behind from both sides and weight shifts well Turn 360 Degrees: Able to turn 360 degrees safely one side only in 4 seconds or less (~6 seconds to turn to each side) Standing Unsupported, Alternately Place Feet on Step/Stool: Able to stand independently and safely and complete 8 steps in 20 seconds Standing Unsupported, One Foot in Front: Able to plae foot ahead of the other independently and hold 30 seconds (can stand tandem for 15 seconds) Standing on One Leg: Able to lift leg independently and hold 5-10 seconds Total Score: 53   See Function Navigator for Current Functional Status.   Therapy/Group: Individual Therapy  Waunita Schooner 09/20/2016, 12:39 PM

## 2016-09-21 LAB — CBC
HCT: 34.7 % — ABNORMAL LOW (ref 39.0–52.0)
Hemoglobin: 10.9 g/dL — ABNORMAL LOW (ref 13.0–17.0)
MCH: 26 pg (ref 26.0–34.0)
MCHC: 31.4 g/dL (ref 30.0–36.0)
MCV: 82.6 fL (ref 78.0–100.0)
Platelets: 314 10*3/uL (ref 150–400)
RBC: 4.2 MIL/uL — ABNORMAL LOW (ref 4.22–5.81)
RDW: 14.5 % (ref 11.5–15.5)
WBC: 13 10*3/uL — ABNORMAL HIGH (ref 4.0–10.5)

## 2016-09-21 LAB — GLUCOSE, CAPILLARY: Glucose-Capillary: 101 mg/dL — ABNORMAL HIGH (ref 65–99)

## 2016-09-21 MED ORDER — POTASSIUM CHLORIDE CRYS ER 20 MEQ PO TBCR: 20.0000 meq | EXTENDED_RELEASE_TABLET | Freq: Every day | ORAL | 0 refills | Status: DC

## 2016-09-21 MED ORDER — ATORVASTATIN CALCIUM 40 MG PO TABS: 40.0000 mg | ORAL_TABLET | Freq: Every day | ORAL | 0 refills | Status: DC

## 2016-09-21 MED ORDER — CARVEDILOL 12.5 MG PO TABS: 12.5000 mg | ORAL_TABLET | Freq: Two times a day (BID) | ORAL | 0 refills | Status: DC

## 2016-09-21 MED ORDER — FUROSEMIDE 20 MG PO TABS: 20.0000 mg | ORAL_TABLET | Freq: Every day | ORAL | 0 refills | Status: DC

## 2016-09-21 MED ORDER — AMIODARONE HCL 200 MG PO TABS: 200.0000 mg | ORAL_TABLET | Freq: Every day | ORAL | 0 refills | Status: DC

## 2016-09-21 MED ORDER — SULFAMETHOXAZOLE-TRIMETHOPRIM 800-160 MG PO TABS: 1.0000 | ORAL_TABLET | Freq: Two times a day (BID) | ORAL | 0 refills | Status: DC

## 2016-09-21 MED ORDER — METFORMIN HCL 500 MG PO TABS: 500.0000 mg | ORAL_TABLET | Freq: Every day | ORAL | 0 refills | Status: DC

## 2016-09-21 MED ORDER — MAGNESIUM OXIDE 400 (241.3 MG) MG PO TABS: 200.0000 mg | ORAL_TABLET | Freq: Two times a day (BID) | ORAL | 0 refills | Status: DC

## 2016-09-21 MED ORDER — LOSARTAN POTASSIUM 25 MG PO TABS: 25.0000 mg | ORAL_TABLET | Freq: Every day | ORAL | 0 refills | Status: DC

## 2016-09-21 MED ORDER — FAMOTIDINE 20 MG PO TABS: 20.0000 mg | ORAL_TABLET | Freq: Two times a day (BID) | ORAL | 0 refills | Status: DC

## 2016-09-21 NOTE — Progress Notes (Signed)
[place] PHYSICAL MEDICINE & REHABILITATION     PROGRESS NOTE  Subjective/Complaints:  Up at EOB. No complaints. Asked about how his injury happened.  ROS: pt denies nausea, vomiting, diarrhea, cough, shortness of breath or chest pain   Objective: Vital Signs: Blood pressure (!) 138/58, pulse 62, temperature 98.6 F (37 C), temperature source Oral, resp. rate 18, weight 110.9 kg (244 lb 7.8 oz), SpO2 100 %. No results found.  Recent Labs  09/19/16 1023 09/21/16 0532  WBC 15.0* 13.0*  HGB 11.0* 10.9*  HCT 35.8* 34.7*  PLT 295 314    Recent Labs  09/19/16 0449  NA 133*  K 4.6  CL 101  GLUCOSE 99  BUN 12  CREATININE 1.25*  CALCIUM 10.1   CBG (last 3)   Recent Labs  09/20/16 1645 09/20/16 2216 09/21/16 0631  GLUCAP 99 95 101*    Wt Readings from Last 3 Encounters:  09/21/16 110.9 kg (244 lb 7.8 oz)  09/15/16 116.6 kg (257 lb 1.6 oz)  03/02/14 133.8 kg (295 lb)    Physical Exam:  BP (!) 138/58 (BP Location: Right Arm)   Pulse 62   Temp 98.6 F (37 C) (Oral)   Resp 18   Wt 110.9 kg (244 lb 7.8 oz)   SpO2 100%   BMI 39.46 kg/m  Constitutional: He appears well-developed. Obese. NAD.  HENT: Normocephalic and atraumatic.  Eyes: EOM are normal. No discharge. Cardiovascular: RRR. Respiratory: chest clear.  GI: Soft. Bowel sounds are normal.  Musculoskeletal: He exhibits no edema. He exhibits no tenderness.  Neurological: He is alert and oriented.  Motor: 4+-5/5 throughout (unchanged) Skin: Skin is warm and dry. No erythema.  Psychiatric: He has a normal mood and affect. His behavior is appropriate   Assessment/Plan: 1. Functional deficits secondary to debility/acute encephalopathy which require 3+ hours per day of interdisciplinary therapy in a comprehensive inpatient rehab setting. Physiatrist is providing close team supervision and 24 hour management of active medical problems listed below. Physiatrist and rehab team continue to assess barriers to  discharge/monitor patient progress toward functional and medical goals.  Function:  Bathing Bathing position   Position: Shower  Bathing parts Body parts bathed by patient: Right arm, Left arm, Chest, Abdomen, Front perineal area, Buttocks, Right upper leg, Left upper leg, Right lower leg, Left lower leg    Bathing assist Assist Level: More than reasonable time (per RN/NT report)      Upper Body Dressing/Undressing Upper body dressing   What is the patient wearing?: Pull over shirt/dress     Pull over shirt/dress - Perfomed by patient: Thread/unthread right sleeve, Thread/unthread left sleeve, Put head through opening          Upper body assist Assist Level: No help, No cues      Lower Body Dressing/Undressing Lower body dressing   What is the patient wearing?: Underwear, Pants, Socks, Shoes Underwear - Performed by patient: Thread/unthread right underwear leg, Thread/unthread left underwear leg, Pull underwear up/down   Pants- Performed by patient: Thread/unthread right pants leg, Thread/unthread left pants leg, Pull pants up/down       Socks - Performed by patient: Don/doff right sock, Don/doff left sock   Shoes - Performed by patient: Don/doff right shoe, Don/doff left shoe Shoes - Performed by helper: Don/doff right shoe, Don/doff left shoe          Lower body assist Assist for lower body dressing: More than reasonable time      Toileting Toileting  Toileting steps completed by patient: Adjust clothing prior to toileting, Performs perineal hygiene, Adjust clothing after toileting Toileting steps completed by helper: Adjust clothing prior to toileting, Performs perineal hygiene, Adjust clothing after toileting (per Elmo Putt, NT report)    Toileting assist Assist level: More than reasonable time   Transfers Chair/bed transfer   Chair/bed transfer method: Ambulatory Chair/bed transfer assist level: Supervision or verbal cues Chair/bed transfer assistive  device: Librarian, academic     Max distance: >150 ft Assist level: Supervision or verbal cues   Wheelchair Wheelchair activity did not occur: N/A        Cognition Comprehension Comprehension assist level: Follows basic conversation/direction with no assist  Expression Expression assist level: Expresses complex 90% of the time/cues < 10% of the time  Social Interaction Social Interaction assist level: Interacts appropriately with others with medication or extra time (anti-anxiety, antidepressant).  Problem Solving Problem solving assist level: Solves basic 90% of the time/requires cueing < 10% of the time  Memory Memory assist level: Recognizes or recalls 75 - 89% of the time/requires cueing 10 - 24% of the time     Medical Problem List and Plan: 1.  Poor endurance and safety awareness secondary to debility/acute encephalopathy.  Cont CIR  -dc home today. Goals met  Patient to see me in the office for transitional care encounter in 1-2 weeks.  2.  DVT Prophylaxis/Anticoagulation: Pharmaceutical: Lovenox 3. Pain Management: N/A 4. Mood: Some anxiety noted--team to provide ego support. LCSW to follow for evaluation and support.  5. Neuropsych: This patient is not fully capable of making decisions on  his own behalf. 6. Skin/Wound Care: routine pressure relief measures  7. Fluids/Electrolytes/Nutrition: Monitor I/O. Supplements prn if intake poor.  8. NSVT: LifeVest in place.   Decrease amiodarone to 200 mg upon discharge.   ASA resumed.  Follow up with Dr. Curt Bears after discharge.  9. 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 Continue lasix, ace and BB.  Filed Weights   09/19/16 0510 09/20/16 0521 09/21/16 0500  Weight: 112 kg (246 lb 14.6 oz) 112 kg (246 lb 14.6 oz) 110.9 kg (244 lb 7.8 oz)   Stable 12/26 10 HTN: Monitor BID.   Continue coreg, lasix and Prinivil.   Monitor renal status and electrolytes closely.   -improved 11.  Hypokalemia: Monitor lytes daily for now. EP recommends K+./=4.0 and Mg >/+2.0  K+ 4.6 on 12/25  Mag 2.1 on 12/25 12.  Diabetes: Hgb A1c- 5.5. Monitor BS ac/hs.   Controlled 12/26 13. OSA: does not use CPAP consistently at home.   Encourage compliance.  14. Citrobacter Bacteremia: Antibiotic day # 10/14  -switched to bactrim yesterday--to complete 3 more days 15. Transaminitis: Question due to shocked liver  Medication history reviewed and discussed with pharmacy, no obvious new offending agents  Improving  Will cont to monitor 16. ABLA:   Hb 11.0 on 12/26     Cont to monitor 17. Leukocytosis  WBCs back to 13k today  -no clinical signs of infection  -bactrim 18. Disorientation  Likely related sleep/wake  Ammonia WNL  Much improved 19. Morbid obesity  Body mass index is 39.46 kg/m.  Diet and exercise education  Encourage weight loss to increase endurance and promote overall health 20. Likely CKD vs. AKI  Cr. 1.25 on 12/26  Encourage fluid intake     LOS (Days) 6 A FACE TO FACE EVALUATION WAS PERFORMED  Dahna Hattabaugh T 09/21/2016 10:26 AM

## 2016-09-21 NOTE — Discharge Instructions (Signed)
Inpatient Rehab Discharge Instructions  Dean [name] Discharge date and time:    Activities/Precautions/ Functional Status: Activity: no lifting, driving, or strenuous exercise for till cleared by cardiology Diet: cardiac diet Wound Care: none needed   Functional status:  ___ No restrictions     ___ Walk up steps independently ___ 24/7 supervision/assistance   ___ Walk up steps with assistance ___ Intermittent supervision/assistance  ___ Bathe/dress independently ___ Walk with walker     ___ Bathe/dress with assistance ___ Walk Independently    ___ Shower independently ___ Walk with assistance    ___ Shower with assistance _X__ No alcohol     ___ Return to work/school ________    COMMUNITY REFERRALS UPON DISCHARGE:    Home Health:   PT     OT     ST    RN                     Agency:  [facility] Phone: [phone]   Medical Equipment/Items Ordered: cane, tub seat                                                     Agency/Supplier:  [facility] [phone]       Special Instructions:    My questions have been answered and I understand these instructions. I will adhere to these goals and the provided educational materials after my discharge from the hospital.  Patient/Caregiver Signature _______________________________ Date __________  Clinician Signature _______________________________________ Date __________  Please bring this form and your medication list with you to all your follow-up doctor's appointments.

## 2016-09-21 NOTE — Progress Notes (Signed)
Alert and oriented x4. Nurse and tech inquired with patient on getting patient changed and ready for the morning. Patient denied being wet or needing help with morning change. Stated wife will be in the morning to bring set of clothes for patient. Refused nurse and tech patient care. Refused to be checked.

## 2016-09-21 NOTE — Discharge Summary (Signed)
Physician Discharge Summary  Patient ID: Dean [name] MRN: [id number] DOB/AGE: September 24, 1951 65 y.o.  Admit date: 09/15/2016 Discharge date: 09/21/2016  Discharge Diagnoses:  Principal Problem:   Anoxic brain injury [facility]) Active Problems:   Bacteremia   Acute systolic congestive heart failure (HCC)   Disorientated   AKI (acute kidney injury) ([place])   Stage 3 chronic kidney disease   Discharged Condition: stable  Significant Diagnostic Studies: N/A   Labs:  Basic Metabolic Panel:  Recent Labs Lab 09/16/16 0537 09/17/16 0532 09/18/16 0521 09/19/16 0449  NA 139 137 137 133*  K 4.2 4.5 4.6 4.6  CL 104 104 103 101  CO2 25 21* 30 25  GLUCOSE 102* 87 105* 99  BUN 9 8 12 12   CREATININE 1.05 1.04 1.27* 1.25*  CALCIUM 9.9 10.0 10.0 10.1  MG  --   --  2.1  --     CBC:  Recent Labs Lab 09/16/16 0537 09/19/16 1023 09/21/16 0532  WBC 11.8* 15.0* 13.0*  NEUTROABS 8.0*  --   --   HGB 11.1* 11.0* 10.9*  HCT 35.0* 35.8* 34.7*  MCV 82.7 85.2 82.6  PLT 330 295 314    CBG:  Recent Labs Lab 09/20/16 0628 09/20/16 1212 09/20/16 1645 09/20/16 2216 09/21/16 0631  GLUCAP 90 101* 99 95 101*    Brief HPI:    HPI:   Dean [name] a 65 y.o.with history of asthma, T2DM, asthma, gout, HTN who was admitted on 08/27/2016 after he collapsed walking back from the bathroom. CPR not performed by family. He was treated with CPR and ALS protocol and he coded again en route to the hospital.  He had episode of NSVT in CT scanner and treated with amiodarone. EKG with inferior T wave inversions and was treated with [employer] protocol and elevated troponin felt to be due to shock applied for VT/VF.  CT head negative for acute changes and  EEG with diffuse cerebral dysfunction but no seizures and Dr. Leonel Ramsay recommended supportive care for management of anoxic encephalopathy.  Cardiac cath 12/14 revealed 90% ostial stenosis distal R-RCA with left to right collaterals and 100%  stenosis mid RCA to distal RCA lesion. Medical therapy recommended and NSVT treated with amiodarone and LifeVest ordered as ICD placement delayed due to Citrobacter Koseri bacteremia. He is to complete 2 week of antibiotic therapy and follow up with Dr. Curt Bears.   He continued to have recurrent hypokalemia requiring supplementation and cards recommends keeping K > 4.0 and Mg > 2.0.   Therapy ongoing and patient with cognitive deficits, poor safety awareness and unsteady gait. CIR recommended for follow up therapy.     Hospital Course: Dean [name] was admitted to rehab 09/15/2016 for inpatient therapies to consist of PT, ST and OT at least three hours five days a week. Past admission physiatrist, therapy team and rehab RN have worked together to provide customized collaborative inpatient rehab.  Acute on chronic systolic CHF has been compensated and weight is down to 244 lbs at discharge. Blood pressures were monitored on bid basis and have been well controlled on coreg, lasix and Prinivil. Lytes have been monitored closely with potassium and magnesium levels are currently at recommended levels with addition of supplements. Po intake has been good and blood sugars have been well controlled on metformin. Abnormal LFTs and reactive leucocytosis are resolving.  Lifevest remains in place without arrhymia and amiodarone was decreased to 200 mg daily at discharge per cardiology recommendations. Blood sugars were monitored on ac/hs  basis and have been well controlled. He was maintained on IV antibiotics and changed to bactrim DS prior to discharge. He is to continue antibiotics thorough 12/30 to complete 2 week course. He has made good progress during his rehab stay and at supervision level at discharge. He will continue to receive follow up [place], [place], [place] and [place] by [place] after discharge.     Rehab course: During patient's stay in rehab weekly team conference were held to monitor patient's  progress, set goals and discuss barriers to discharge. At admission, patient required min assist for mobility and basic self care tasks. He exhibited mild cognitive impairment affecting attention, safety, recall and executive functioning with MoCA score 22/30.  He has had improvement in activity tolerance, balance, postural control, as well as ability to compensate for deficits. He is able to complete ADL tasks with supervision. He is ambulating > 150' with SPC and supervision. He requires min assist for cognitive tasks and continues to exhibit deficits in recall and task organization. Family education was completed regarding all aspects of mobility as well as strategies to maximize carryover, safety and memory to help with functional independence. .    Disposition: 01-Home or Self Care  Diet: Heart Healthy.   Special Instructions: 1. No driving or strenuous activity till cleared by MD. 2. Wear lifevest at  All times.  3. HHRN to draw BMET and magnesium levels on  09/27/16 with results to [facility] cardiology.     Allergies as of 09/21/2016      Reactions   Lisinopril    cough      Medication List    STOP taking these medications   docusate 50 MG/5ML liquid Commonly known as:  COLACE   feeding supplement (GLUCERNA SHAKE) Liqd   insulin aspart 100 UNIT/ML injection Commonly known as:  novoLOG   lisinopril 5 MG tablet Commonly known as:  PRINIVIL,ZESTRIL     TAKE these medications   allopurinol 300 MG tablet Commonly known as:  ZYLOPRIM Take 300 mg by mouth daily.   amiodarone 200 MG tablet Commonly known as:  PACERONE Take 1 tablet (200 mg total) by mouth daily. What changed:  medication strength  how much to take  when to take this  additional instructions   aspirin 81 MG EC tablet Take 1 tablet (81 mg total) by mouth daily.   atorvastatin 40 MG tablet Commonly known as:  LIPITOR Take 1 tablet (40 mg total) by mouth daily at 6 PM.   carvedilol 12.5 MG  tablet Commonly known as:  COREG Take 1 tablet (12.5 mg total) by mouth 2 (two) times daily with a meal.   [employer] VITAMIN D3 1000 units capsule Generic drug:  Cholecalciferol Take 1,000 Units by mouth daily.   famotidine 20 MG tablet Commonly known as:  PEPCID Take 1 tablet (20 mg total) by mouth 2 (two) times daily.   furosemide 20 MG tablet Commonly known as:  LASIX Take 1 tablet (20 mg total) by mouth daily.   losartan 25 MG tablet Commonly known as:  COZAAR Take 1 tablet (25 mg total) by mouth daily.   magnesium oxide 400 (241.3 Mg) MG tablet Commonly known as:  MAG-OX Take 0.5 tablets (200 mg total) by mouth 2 (two) times daily.   metFORMIN 500 MG tablet Commonly known as:  GLUCOPHAGE Take 1 tablet (500 mg total) by mouth daily with breakfast.   multivitamin with minerals Tabs tablet Take 1 tablet by mouth daily.   potassium  chloride SA 20 MEQ tablet Commonly known as:  K-DUR,KLOR-CON Take 1 tablet (20 mEq total) by mouth daily.   sulfamethoxazole-trimethoprim 800-160 MG tablet Commonly known as:  BACTRIM DS,SEPTRA DS Take 1 tablet by mouth 2 (two) times daily. Continue till 12/30 then stop Start taking on:  09/23/2016      Follow-up Information    Meredith Staggers, MD Follow up.   Specialty:  Physical Medicine and Rehabilitation Contact information: [address] [name] [address] [phone]        Will Meredith Leeds, MD Follow up.   Specialty:  Cardiology Contact information: [address] [place] [address] [phone]        [name], MD Follow up on 10/06/2016.   Specialty:  Internal Medicine Why:  @ 2:15 pm (hospital follow up) Contact information: [address]  [address] [phone]           Signed: Bary Leriche 09/21/2016, 1:17 PM

## 2016-09-21 NOTE — Progress Notes (Signed)
Social Work  Discharge Note  The overall goal for the admission was met for:   Discharge location: Yes - home with wife and family able to provide 24/7 supervision  Length of Stay: Yes - 6 days  Discharge activity level: Yes - supervision  Home/community participation: Yes  Services provided included: MD, RD, PT, OT, SLP, RN, TR, Pharmacy and SW  Financial Services: Other: [employer]  Follow-up services arranged: Home Health: RN, PT, OT, ST via [facility], DME: cane, tub seat via [facility] and Patient/Family has no preference for HH/DME agencies  Comments (or additional information):  Patient/Family verbalized understanding of follow-up arrangements: Yes  Individual responsible for coordination of the follow-up plan: pt/ wife  Confirmed correct DME delivered: Lennart Pall 09/21/2016    Andyn Sales

## 2016-09-22 ENCOUNTER — Telehealth: Payer: Self-pay

## 2016-09-22 NOTE — Telephone Encounter (Signed)
First attempt Transitional Care Call. No answer left message to call office so that we can discuss follow up appointment schedule for 1/1/0/2018 with Dr. Posey Pronto 1st then to Dr. Naaman Plummer.

## 2016-09-26 NOTE — Telephone Encounter (Signed)
Transitional care call completed, appointment confirmed, address confirmed, new patient packet mailed  Transitional Care Questions  Questions for our staff to ask patients on Transitional care 48 hour phone call:   1. Are you/is patient experiencing any problems since coming home? No  Are there any questions regarding any aspect of care? No  2. Are there any questions regarding medications administration/dosing?  no Are meds being taken as prescribed? Yes Patient should review meds with caller to confirm   3. Have there been any falls? No  4. Has Home Health been to the house and/or have they contacted you? Yes, they called yesterday. Awaiting call back If not, have you tried to contact them? Can we help you contact them?   5. Are bowels and bladder emptying properly?  Yes  Are there any unexpected incontinence issues? If applicable, is patient following bowel/bladder programs?  6. Any fevers, problems with breathing, unexpected pain? No   7. Are there any skin problems or new areas of breakdown? No  8. Has the patient/family member arranged specialty MD follow up (ie cardiology/neurology/renal/surgical/etc)?  Yes Can we help arrange?   9. Does the patient need any other services or support that we can help arrange? No  10. Are caregivers following through as expected in assisting the patient? Yes   11. Has the patient quit smoking, drinking alcohol, or using drugs as recommended? No smoke, No drink, No illicit drugs

## 2016-09-29 ENCOUNTER — Telehealth: Payer: Self-pay | Admitting: Physical Medicine & Rehabilitation

## 2016-09-29 NOTE — Telephone Encounter (Signed)
Verbal order given  

## 2016-09-29 NOTE — Telephone Encounter (Signed)
Barnie Del [phone] called needs verbal order for pt ot sp for [place] - wants to start 2x wk for 3 wk and 1 x wk x1 wk -- will start Monday if you call in order to her

## 2016-10-04 ENCOUNTER — Encounter: Payer: Medicare HMO | Attending: Physical Medicine & Rehabilitation | Admitting: Physical Medicine & Rehabilitation

## 2016-10-04 ENCOUNTER — Encounter: Payer: Self-pay | Admitting: Physical Medicine & Rehabilitation

## 2016-10-04 DIAGNOSIS — Z8249 Family history of ischemic heart disease and other diseases of the circulatory system: Secondary | ICD-10-CM | POA: Diagnosis not present

## 2016-10-04 DIAGNOSIS — I10 Essential (primary) hypertension: Secondary | ICD-10-CM

## 2016-10-04 DIAGNOSIS — R531 Weakness: Secondary | ICD-10-CM | POA: Insufficient documentation

## 2016-10-04 DIAGNOSIS — Z955 Presence of coronary angioplasty implant and graft: Secondary | ICD-10-CM | POA: Diagnosis not present

## 2016-10-04 DIAGNOSIS — M109 Gout, unspecified: Secondary | ICD-10-CM | POA: Insufficient documentation

## 2016-10-04 DIAGNOSIS — E119 Type 2 diabetes mellitus without complications: Secondary | ICD-10-CM | POA: Diagnosis not present

## 2016-10-04 DIAGNOSIS — I5021 Acute systolic (congestive) heart failure: Secondary | ICD-10-CM

## 2016-10-04 DIAGNOSIS — J45909 Unspecified asthma, uncomplicated: Secondary | ICD-10-CM | POA: Insufficient documentation

## 2016-10-04 DIAGNOSIS — R5381 Other malaise: Secondary | ICD-10-CM | POA: Insufficient documentation

## 2016-10-04 DIAGNOSIS — F411 Generalized anxiety disorder: Secondary | ICD-10-CM

## 2016-10-04 DIAGNOSIS — Z809 Family history of malignant neoplasm, unspecified: Secondary | ICD-10-CM | POA: Insufficient documentation

## 2016-10-04 DIAGNOSIS — Z8674 Personal history of sudden cardiac arrest: Secondary | ICD-10-CM | POA: Insufficient documentation

## 2016-10-04 DIAGNOSIS — E7800 Pure hypercholesterolemia, unspecified: Secondary | ICD-10-CM | POA: Diagnosis not present

## 2016-10-04 DIAGNOSIS — I4729 Other ventricular tachycardia: Secondary | ICD-10-CM

## 2016-10-04 DIAGNOSIS — G4733 Obstructive sleep apnea (adult) (pediatric): Secondary | ICD-10-CM

## 2016-10-04 DIAGNOSIS — G9340 Encephalopathy, unspecified: Secondary | ICD-10-CM | POA: Diagnosis not present

## 2016-10-04 DIAGNOSIS — I472 Ventricular tachycardia: Secondary | ICD-10-CM

## 2016-10-04 DIAGNOSIS — I110 Hypertensive heart disease with heart failure: Secondary | ICD-10-CM | POA: Insufficient documentation

## 2016-10-04 DIAGNOSIS — F419 Anxiety disorder, unspecified: Secondary | ICD-10-CM | POA: Insufficient documentation

## 2016-10-04 NOTE — Progress Notes (Signed)
Subjective:    Patient ID: Dean [name], male    DOB: 11-01-1950, 66 y.o.   MRN: [id number]  HPI 66 y.o. with history of asthma, T2DM, asthma, gout, HTN presents for transitional care management after receiving CIR for acute encephalopathy.    Admit date: 09/15/2016 Discharge date: 09/21/2016  Presents with wife, who provides part of the history. Since discharge, he continues to take his medications.  He sees Cardiology tomorrow.  He has been intermittently checking his weights, which have been stable. His CBGs have been ~115. He is still not using his CPAP.  He completed his course of abx. He sees his PCP Friday. He is not driving.  He continues to wear the lifevest. Labs being followed by Cardiology.   DME: Shower chair Therapies: 3/week. Mobility: Cane in community   Pain Inventory Average Pain 0 Pain Right Now 0 My pain is no pain  In the last 24 hours, has pain interfered with the following? General activity 0 Relation with others 0 Enjoyment of life 7 What TIME of day is your pain at its worst? no pain Sleep (in general) Good  Pain is worse with: no pain Pain improves with: no pain Relief from Meds: no pain  Mobility walk without assistance walk with assistance use a cane ability to climb steps?  yes do you drive?  yes  Function retired  Neuro/Psych No problems in this area  Prior Studies hospital f/u  Physicians involved in your care hospital f/u   Family History  Problem Relation Age of Onset  . Heart attack Father     died in his 67's  . Hypertension Sister   . Cancer Brother     uncertain, "leg"  . Hypertension Sister    Social History   Social History  . Marital status: Married    Spouse name: N/A  . Number of children: N/A  . Years of education: N/A   Social History Main Topics  . Smoking status: Never Smoker  . Smokeless tobacco: Never Used  . Alcohol use No  . Drug use: No  . Sexual activity: Not Asked   Other Topics  Concern  . None   Social History Narrative  . None   Past Surgical History:  Procedure Laterality Date  . CARDIAC CATHETERIZATION N/A 09/07/2016   Procedure: Left Heart Cath and Coronary Angiography;  Surgeon: Peter M Martinique, MD;  Location: [facility];  Service: Cardiovascular;  Laterality: N/A;  . NO PAST SURGERIES     Past Medical History:  Diagnosis Date  . Arthritis    "maybe in his spine" (09/05/2016)  . Asthma   . Cardiac arrest ([place]) 08/27/2016   REQUIRING CPR, SHOCK & MEDICATIONS  . Gout   . High cholesterol   . Hypertension   . OSA on CPAP   . Type II diabetes mellitus (HCC)    BP 131/72 (BP Location: Left Arm, Patient Position: Sitting, Cuff Size: Large)   Pulse 60   Resp 14   SpO2 99%   Opioid Risk Score:   Fall Risk Score:  `1  Depression screen PHQ 2/9  Depression screen PHQ 2/9 10/04/2016  Decreased Interest 0  Down, Depressed, Hopeless 1  PHQ - 2 Score 1  Altered sleeping 0  Tired, decreased energy 0  Change in appetite 0  Feeling bad or failure about yourself  0  Trouble concentrating 0  Moving slowly or fidgety/restless 0  Suicidal thoughts 0  PHQ-9 Score 1  Review of Systems  Constitutional: Negative.   HENT: Negative.   Eyes: Negative.   Respiratory: Negative.   Cardiovascular: Negative.   Gastrointestinal: Negative.   Endocrine:       Pre-diabetic  Genitourinary: Negative.   Musculoskeletal: Positive for gait problem.  Skin: Negative.   Allergic/Immunologic: Negative.   Psychiatric/Behavioral: Negative.   All other systems reviewed and are negative.      Objective:   Physical Exam  Constitutional: He appears well-developed. Obese. NAD.  HENT: Normocephalic and atraumatic.  Eyes: EOM are normal. No discharge. Cardiovascular: RRR. No JVD. Respiratory: chest clear. Unlabored.  GI: Soft. Bowel sounds are normal.  Musculoskeletal: He exhibits no edema. He exhibits no tenderness.  Gait: WNL without assistive  device Neurological: He is alert and oriented.  Motor: 5/5 throughout  Skin: Skin is warm and dry. No erythema.  Psychiatric: He has a normal mood and affect. His behavior is appropriate     Assessment & Plan:  66 y.o. with history of asthma, T2DM, asthma, gout, HTN presents for transitional care management after receiving CIR for acute encephalopathy.     1. Debility  Acute encephalopathy appears to have resolved  Cont therapies  2. Anxiety  Mild, pt does not believe he needs any medications at presents.   Educated on deep breathing  3. 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 OSA  Educated again on importance of compliance.  Pt states he does not feel he needs it at present  6. Morbid obesity  Continued to encourage weight loss  Meds reviewed Referrals reviewed All questions answered

## 2016-10-05 ENCOUNTER — Other Ambulatory Visit: Payer: Self-pay | Admitting: Cardiology

## 2016-10-05 ENCOUNTER — Ambulatory Visit (INDEPENDENT_AMBULATORY_CARE_PROVIDER_SITE_OTHER): Payer: Medicare HMO | Admitting: Cardiology

## 2016-10-05 ENCOUNTER — Encounter (INDEPENDENT_AMBULATORY_CARE_PROVIDER_SITE_OTHER): Payer: Self-pay

## 2016-10-05 ENCOUNTER — Encounter: Payer: Self-pay | Admitting: Cardiology

## 2016-10-05 ENCOUNTER — Encounter: Payer: Self-pay | Admitting: *Deleted

## 2016-10-05 VITALS — BP 130/62 | HR 65 | Ht 66.0 in | Wt 257.6 lb

## 2016-10-05 DIAGNOSIS — Z01812 Encounter for preprocedural laboratory examination: Secondary | ICD-10-CM

## 2016-10-05 DIAGNOSIS — I469 Cardiac arrest, cause unspecified: Secondary | ICD-10-CM

## 2016-10-05 DIAGNOSIS — I255 Ischemic cardiomyopathy: Secondary | ICD-10-CM

## 2016-10-05 NOTE — Addendum Note (Signed)
Addended by: Stanton Kidney on: 10/05/2016 10:45 AM   Modules accepted: Orders

## 2016-10-05 NOTE — Patient Instructions (Signed)
Medication Instructions:    Your physician recommends that you continue on your current medications as directed. Please refer to the Current Medication list given to you today.  --- If you need a refill on your cardiac medications before your next appointment, please call your pharmacy. ---  Labwork:  Pre procedure lab work today:  BMET & CBC w/ diff  Testing/Procedures: Your physician has recommended that you have a defibrillator inserted. An implantable cardioverter defibrillator (ICD) is a small device that is placed in your chest or, in rare cases, your abdomen. This device uses electrical pulses or shocks to help control life-threatening, irregular heartbeats that could lead the heart to suddenly stop beating (sudden cardiac arrest). Leads are attached to the ICD that goes into your heart. This is done in the hospital and usually requires an overnight stay. Please see the instruction sheet given to you today for more information.  Follow-Up:  Your physician recommends that you schedule a wound check appointment 10-14 days, after your procedure on 10/10/16, with the device clinic.   Your physician recommends that you schedule a follow up appointment in 3 months, after your procedure on 10/10/16, with Dr. Curt Bears.  Thank you for choosing [facility]!!   Trinidad Curet, RN [phone]  Any Other Special Instructions Will Be Listed Below (If Applicable).   Cardioverter Defibrillator Implantation An implantable cardioverter defibrillator (ICD) is a small, lightweight, battery-powered device that is placed (implanted) under the skin in the chest or abdomen. Your caregiver may prescribe an ICD if:  You have had an irregular heart rhythm (arrhythmia) that originated in the lower chambers of the heart (ventricles).  Your heart has been damaged by a disease (such as coronary artery disease) or heart condition (such as a heart attack). An ICD consists of a battery that lasts several  years, a small computer called a pulse generator, and wires called leads that go into the heart. It is used to detect and correct two dangerous arrhythmias: a rapid heart rhythm (tachycardia) and an arrhythmia in which the ventricles contract in an uncoordinated way (fibrillation). When an ICD detects tachycardia, it sends an electrical signal to the heart that restores the heartbeat to normal (cardioversion). This signal is usually painless. If cardioversion does not work or if the ICD detects fibrillation, it delivers a small electrical shock to the heart (defibrillation) to restart the heart. The shock may feel like a strong jolt in the chest.ICDs may be programmed to correct other problems. Sometimes, ICDs are programmed to act as another type of implantable device called a pacemaker. Pacemakers are used to treat a slow heartbeat (bradycardia). LET YOUR CAREGIVER KNOW ABOUT:  Any allergies you have.  All medicines you are taking, including vitamins, herbs, eyedrops, and over-the-counter medicines and creams.  Previous problems you or members of your family have had with the use of anesthetics.  Any blood disorders you have had.  Other health problems you have. RISKS AND COMPLICATIONS Generally, the procedure to implant an ICD is safe. However, as with any surgical procedure, complications can occur. Possible complications associated with implanting an ICD include:  Swelling, bleeding, or bruising at the site where the ICD was implanted.  Infection at the site where the ICD was implanted.  A reaction to medicine used during the procedure.  Nerve, heart, or blood vessel damage.  Blood clots. BEFORE THE PROCEDURE  You may need to have blood tests, heart tests, or a chest X-ray done before the day of the procedure.  Ask your caregiver about changing or stopping your regular medicines.  Make plans to have someone drive you home. You may need to stay in the hospital overnight after the  procedure.  Stop smoking at least 24 hours before the procedure.  Take a bath or shower the night before the procedure. You may need to scrub your chest or abdomen with a special type of soap.  Do not eat or drink before your procedure for as long as directed by your caregiver. Ask if it is okay to take any needed medicine with a small sip of water. PROCEDURE  The procedure to implant an ICD in your chest or abdomen is usually done at a hospital in a room that has a large X-ray machine called a fluoroscope. The machine will be above you during the procedure. It will help your caregiver see your heart during the procedure. Implanting an ICD usually takes 1-3 hours. Before the procedure:   Small monitors will be put on your body. They will be used to check your heart, blood pressure, and oxygen level.  A needle will be put into a vein in your hand or arm. This is called an intravenous (IV) access tube. Fluids and medicine will flow directly into your body through the IV tube.  Your chest or abdomen will be cleaned with a germ-killing (antiseptic) solution. The area may be shaved.  You may be given medicine to help you relax (sedative).  You will be given a medicine called a local anesthetic. This medicine will make the surgical site numb while the ICD is implanted. You will be sleepy but awake during the procedure. After you are numb the procedure will begin. The caregiver will:  Make a small cut (incision). This will make a pocket deep under your skin that will hold the pulse generator.  Guide the leads through a large blood vessel into your heart and attach them to the heart muscles. Depending on the ICD, the leads may go into one ventricle or they may go to both ventricles and into an upper chamber of the heart (atrium).  Test the ICD.  Close the incision with stitches, glue, or staples. AFTER THE PROCEDURE  You may feel pain. Some pain is normal. It may last a few days.  You may  stay in a recovery area until the local anesthetic has worn off. Your blood pressure and pulse will be checked often. You will be taken to a room where your heart will be monitored.  A chest X-ray will be taken. This is done to check that the cardioverter defibrillator is in the right place.  You may stay in the hospital overnight.  A slight bump may be seen over the skin where the ICD was placed. Sometimes, it is possible to feel the ICD under the skin. This is normal.  In the months and years afterward, your caregiver will check the device, the leads, and the battery every few months. Eventually, when the battery is low, the ICD will be replaced.   This information is not intended to replace advice given to you by your health care provider. Make sure you discuss any questions you have with your health care provider.   Document Released: 06/03/2002 Document Revised: 07/02/2013 Document Reviewed: 09/30/2012 Elsevier Interactive Patient Education [employer].

## 2016-10-05 NOTE — Progress Notes (Signed)
Electrophysiology Office Note   Date:  10/05/2016   ID:  Dean [name], DOB 02/28/1951, MRN [id number]  PCP:  [name], MD Primary Electrophysiologist:  [name], MD    Chief Complaint  Patient presents with  . Follow-up    chronic combined systolic and diastolic CHF/discuss ICD implant     History of Present Illness: Dean [name] is a 66 y.o. male who presents today for electrophysiology evaluation.   Hx asthma, T2DM, asthma, gout, HTN who was admitted on 08/27/2016 after he collapsed walking back from the bathroom. CPR not performed by family. He was treated with CPR and ALS protocol and he coded again en route to the hospital.  He had episode of NSVT in CT scanner and treated with amiodarone. EKG with inferior T wave inversions and was treated with [employer] protocol and elevated troponin felt to be due to shock applied for VT/VF. Cardiac cath 12/14 revealed 90% ostial stenosis distal R-RCA with left to right collaterals and 100% stenosis mid RCA to distal RCA lesion. ICD placement was delayed due to Citrobacter Koseri bacteremia. Was discharged with life vest.   Today, he denies symptoms of palpitations, chest pain, shortness of breath, orthopnea, PND, lower extremity edema, claudication, presyncope, syncope, bleeding, or neurologic sequela. The patient is tolerating medications without difficulties and is otherwise without complaint today.    Past Medical History:  Diagnosis Date  . Arthritis    "maybe in his spine" (09/05/2016)  . Asthma   . Cardiac arrest ([place]) 08/27/2016   REQUIRING CPR, SHOCK & MEDICATIONS  . Gout   . High cholesterol   . Hypertension   . OSA on CPAP   . Type II diabetes mellitus ([place])    Past Surgical History:  Procedure Laterality Date  . CARDIAC CATHETERIZATION N/A 09/07/2016   Procedure: Left Heart Cath and Coronary Angiography;  Surgeon: Peter M Martinique, MD;  Location: [facility];  Service: Cardiovascular;   Laterality: N/A;  . NO PAST SURGERIES       Current Outpatient Prescriptions  Medication Sig Dispense Refill  . allopurinol (ZYLOPRIM) 300 MG tablet Take 300 mg by mouth daily.    [name] amiodarone (PACERONE) 200 MG tablet Take 1 tablet (200 mg total) by mouth daily. 30 tablet 0  . aspirin EC 81 MG EC tablet Take 1 tablet (81 mg total) by mouth daily.    [name] atorvastatin (LIPITOR) 40 MG tablet Take 1 tablet (40 mg total) by mouth daily at 6 PM. 30 tablet 0  . carvedilol (COREG) 12.5 MG tablet Take 1 tablet (12.5 mg total) by mouth 2 (two) times daily with a meal. 60 tablet 0  . CVS VITAMIN D3 1000 units capsule Take 1,000 Units by mouth daily.  5  . famotidine (PEPCID) 20 MG tablet Take 1 tablet (20 mg total) by mouth 2 (two) times daily. 60 tablet 0  . furosemide (LASIX) 20 MG tablet Take 1 tablet (20 mg total) by mouth daily. 30 tablet 0  . losartan (COZAAR) 25 MG tablet Take 1 tablet (25 mg total) by mouth daily. 30 tablet 0  . magnesium oxide (MAG-OX) 400 (241.3 Mg) MG tablet Take 0.5 tablets (200 mg total) by mouth 2 (two) times daily. 60 tablet 0  . metFORMIN (GLUCOPHAGE) 500 MG tablet Take 1 tablet (500 mg total) by mouth daily with breakfast. 30 tablet 0  . Multiple Vitamin (MULTIVITAMIN WITH MINERALS) TABS tablet Take 1 tablet by mouth daily.    . potassium chloride  SA (K-DUR,KLOR-CON) 20 MEQ tablet Take 1 tablet (20 mEq total) by mouth daily. 30 tablet 0  . sulfamethoxazole-trimethoprim (BACTRIM DS,SEPTRA DS) 800-160 MG tablet Take 1 tablet by mouth 2 (two) times daily. Continue till 12/30 then stop 6 tablet 0   No current facility-administered medications for this visit.     Allergies:   Lisinopril   Social History:  The patient  reports that he has never smoked. He has never used smokeless tobacco. He reports that he does not drink alcohol or use drugs.   Family History:  The patient's family history includes Cancer in his brother; Heart attack in his father; Hypertension in his  sister and sister.    ROS:  Please see the history of present illness.   Otherwise, review of systems is positive for cough,  Diarrhea,anxiety, occasional dizziness.   All other systems are reviewed and negative.    PHYSICAL EXAM: VS:  BP 130/62   Pulse 65   Ht 5\' 6"  (1.676 m)   Wt 257 lb 9.6 oz (116.8 kg)   BMI 41.58 kg/m  , BMI Body mass index is 41.58 kg/m. GEN: Well nourished, well developed, in no acute distress  HEENT: normal  Neck: no JVD, carotid bruits, or masses Cardiac: RRR; no murmurs, rubs, or gallops,no edema  Respiratory:  clear to auscultation bilaterally, normal work of breathing GI: soft, nontender, nondistended, + BS MS: no deformity or atrophy Skin: warm and dry Neuro:  Strength and sensation are intact Psych: euthymic mood, full affect  EKG:  EKG is ordered today. Personal review of the ekg ordered shows sinus rhythm, 1 degree AV block, LBBB  Recent Labs: 09/18/2016: ALT 85; Magnesium 2.1 09/19/2016: BUN 12; Creatinine, Ser 1.25; Potassium 4.6; Sodium 133 09/21/2016: Hemoglobin 10.9; Platelets 314    Lipid Panel     Component Value Date/Time   CHOL 159 09/07/2016 0844   TRIG 146 09/07/2016 0844   HDL 25 (L) 09/07/2016 0844   CHOLHDL 6.4 09/07/2016 0844   VLDL 29 09/07/2016 0844   LDLCALC 105 (H) 09/07/2016 0844     Wt Readings from Last 3 Encounters:  10/05/16 257 lb 9.6 oz (116.8 kg)  09/21/16 244 lb 7.8 oz (110.9 kg)  09/15/16 257 lb 1.6 oz (116.6 kg)      Other studies Reviewed: Additional studies/ records that were reviewed today include: TTE 08/28/16, Cath 09/07/16  Review of the above records today demonstrates:  - Left ventricle: The cavity size was mildly dilated. Wall   thickness was increased in a pattern of severe LVH. Systolic   function was moderately reduced. The estimated ejection fraction   was in the range of 35% to 40%. Akinesis of the basalinferior and   apical myocardium. Features are consistent with a pseudonormal    left ventricular filling pattern, with concomitant abnormal   relaxation and increased filling pressure (grade 2 diastolic   dysfunction). - Aortic valve: There was mild regurgitation. - Mitral valve: There was mild regurgitation. - Left atrium: The atrium was severely dilated.   There is moderate left ventricular systolic dysfunction.  LV end diastolic pressure is normal.  The left ventricular ejection fraction is 35-45% by visual estimate.  Ost RCA lesion, 90 %stenosed.  Mid RCA to Dist RCA lesion, 100 %stenosed.   1. Single vessel occlusive CAD. The distal RCA is occluded with left to right collaterals. There is a 90% ostial stenosis 2. Moderate LV dysfunction with EF 35-40%. Akinesis of the basal to mid inferior wall  3. Normal LVEDP   ASSESSMENT AND PLAN:  1.  VT/VF arrest: Had out of hospital arrest with good recovery in mental status.  Had bacteremia in the hospital which has since improved.  Discussed ICD therapy.  Risk and benefits discussed.  Risks include but not limited to bleeding, infection, tamponade, pneumothorax.  The patient understands the risks and has agreed to the procedure.  2. Ischemic cardiomyopathy: Has disease in the RCA. On optimal medical therapy with coreg and losartan. Plan to place an ICD for secondary prevention.  3. CAD: continue aspirin  4. Hyperlipidemia: continue statin  Current medicines are reviewed at length with the patient today.   The patient does not have concerns regarding his medicines.  The following changes were made today:  none  Labs/ tests ordered today include:  Orders Placed This Encounter  Procedures  . EKG 12-Lead     Disposition:   FU with Floretta Petro 3 months  Signed, Amaris Delafuente Meredith Leeds, MD  10/05/2016 10:34 AM     [facility] [address] [place] [address] [phone] (office) [phone] (fax)

## 2016-10-06 LAB — CBC WITH DIFFERENTIAL/PLATELET
Basophils Absolute: 0 10*3/uL (ref 0.0–0.2)
Basos: 0 %
EOS (ABSOLUTE): 0.4 10*3/uL (ref 0.0–0.4)
Eos: 5 %
Hematocrit: 35.9 % — ABNORMAL LOW (ref 37.5–51.0)
Hemoglobin: 11.5 g/dL — ABNORMAL LOW (ref 13.0–17.7)
Immature Grans (Abs): 0 10*3/uL (ref 0.0–0.1)
Immature Granulocytes: 0 %
Lymphocytes Absolute: 2.9 10*3/uL (ref 0.7–3.1)
Lymphs: 32 %
MCH: 26.7 pg (ref 26.6–33.0)
MCHC: 32 g/dL (ref 31.5–35.7)
MCV: 83 fL (ref 79–97)
Monocytes Absolute: 0.7 10*3/uL (ref 0.1–0.9)
Monocytes: 8 %
Neutrophils Absolute: 5 10*3/uL (ref 1.4–7.0)
Neutrophils: 55 %
Platelets: 152 10*3/uL (ref 150–379)
RBC: 4.31 x10E6/uL (ref 4.14–5.80)
RDW: 15.4 % (ref 12.3–15.4)
WBC: 9.1 10*3/uL (ref 3.4–10.8)

## 2016-10-06 LAB — BASIC METABOLIC PANEL
BUN/Creatinine Ratio: 15 (ref 10–24)
BUN: 21 mg/dL (ref 8–27)
CO2: 22 mmol/L (ref 18–29)
Calcium: 10.6 mg/dL — ABNORMAL HIGH (ref 8.6–10.2)
Chloride: 100 mmol/L (ref 96–106)
Creatinine, Ser: 1.36 mg/dL — ABNORMAL HIGH (ref 0.76–1.27)
GFR calc Af Amer: 62 mL/min/{1.73_m2} (ref >59)
GFR calc non Af Amer: 54 mL/min/{1.73_m2} — ABNORMAL LOW (ref >59)
Glucose: 94 mg/dL (ref 65–99)
Potassium: 5.2 mmol/L (ref 3.5–5.2)
Sodium: 140 mmol/L (ref 134–144)

## 2016-10-10 ENCOUNTER — Encounter (HOSPITAL_COMMUNITY): Payer: Self-pay | Admitting: Cardiology

## 2016-10-10 ENCOUNTER — Encounter (HOSPITAL_COMMUNITY)
Admission: RE | Disposition: A | Discharge status: Home / Self Care | Payer: Self-pay | Source: Ambulatory Visit | Attending: Cardiology

## 2016-10-10 ENCOUNTER — Ambulatory Visit (HOSPITAL_COMMUNITY)
Admission: RE | Admit: 2016-10-10 | Discharge: 2016-10-11 | Disposition: A | Discharge status: Home / Self Care | Payer: Medicare HMO | Source: Ambulatory Visit | Attending: Cardiology | Admitting: Cardiology

## 2016-10-10 DIAGNOSIS — I255 Ischemic cardiomyopathy: Secondary | ICD-10-CM

## 2016-10-10 DIAGNOSIS — G4733 Obstructive sleep apnea (adult) (pediatric): Secondary | ICD-10-CM | POA: Diagnosis not present

## 2016-10-10 DIAGNOSIS — Z8674 Personal history of sudden cardiac arrest: Secondary | ICD-10-CM | POA: Diagnosis not present

## 2016-10-10 DIAGNOSIS — Z95810 Presence of automatic (implantable) cardiac defibrillator: Secondary | ICD-10-CM

## 2016-10-10 DIAGNOSIS — Z7984 Long term (current) use of oral hypoglycemic drugs: Secondary | ICD-10-CM | POA: Diagnosis not present

## 2016-10-10 DIAGNOSIS — I2510 Atherosclerotic heart disease of native coronary artery without angina pectoris: Secondary | ICD-10-CM | POA: Diagnosis not present

## 2016-10-10 DIAGNOSIS — Z7982 Long term (current) use of aspirin: Secondary | ICD-10-CM | POA: Insufficient documentation

## 2016-10-10 DIAGNOSIS — I447 Left bundle-branch block, unspecified: Secondary | ICD-10-CM | POA: Diagnosis not present

## 2016-10-10 DIAGNOSIS — I700 Atherosclerosis of aorta: Secondary | ICD-10-CM | POA: Insufficient documentation

## 2016-10-10 DIAGNOSIS — Z95818 Presence of other cardiac implants and grafts: Secondary | ICD-10-CM

## 2016-10-10 DIAGNOSIS — I2582 Chronic total occlusion of coronary artery: Secondary | ICD-10-CM | POA: Insufficient documentation

## 2016-10-10 DIAGNOSIS — E119 Type 2 diabetes mellitus without complications: Secondary | ICD-10-CM | POA: Insufficient documentation

## 2016-10-10 DIAGNOSIS — I5042 Chronic combined systolic (congestive) and diastolic (congestive) heart failure: Secondary | ICD-10-CM | POA: Insufficient documentation

## 2016-10-10 DIAGNOSIS — I509 Heart failure, unspecified: Secondary | ICD-10-CM

## 2016-10-10 HISTORY — DX: Presence of automatic (implantable) cardiac defibrillator: Z95.810

## 2016-10-10 HISTORY — PX: EP IMPLANTABLE DEVICE: SHX172B

## 2016-10-10 LAB — GLUCOSE, CAPILLARY
Glucose-Capillary: 100 mg/dL — ABNORMAL HIGH (ref 65–99)
Glucose-Capillary: 101 mg/dL — ABNORMAL HIGH (ref 65–99)

## 2016-10-10 LAB — SURGICAL PCR SCREEN
MRSA, PCR: NEGATIVE
Staphylococcus aureus: NEGATIVE

## 2016-10-10 SURGERY — ICD IMPLANT
Anesthesia: LOCAL

## 2016-10-10 MED ORDER — METFORMIN HCL 500 MG PO TABS
500.0000 mg | ORAL_TABLET | Freq: Every day | ORAL | Status: DC
  Administered 2016-10-11: 500 mg via ORAL
  Filled 2016-10-10: qty 1

## 2016-10-10 MED ORDER — HEPARIN (PORCINE) IN NACL 2-0.9 UNIT/ML-% IJ SOLN
INTRAMUSCULAR | Status: DC | PRN
Start: 2016-10-10 — End: 2016-10-10
  Administered 2016-10-10: 500 mL

## 2016-10-10 MED ORDER — DIPHENHYDRAMINE HCL 50 MG/ML IJ SOLN
INTRAMUSCULAR | Status: AC
  Filled 2016-10-10: qty 1

## 2016-10-10 MED ORDER — FAMOTIDINE 20 MG PO TABS
20.0000 mg | ORAL_TABLET | Freq: Two times a day (BID) | ORAL | Status: DC
  Administered 2016-10-10 – 2016-10-11 (×3): 20 mg via ORAL
  Filled 2016-10-10 (×3): qty 1

## 2016-10-10 MED ORDER — MIDAZOLAM HCL 5 MG/5ML IJ SOLN
INTRAMUSCULAR | Status: DC | PRN
  Administered 2016-10-10 (×2): 1 mg via INTRAVENOUS

## 2016-10-10 MED ORDER — MAGNESIUM OXIDE 400 (241.3 MG) MG PO TABS
200.0000 mg | ORAL_TABLET | Freq: Two times a day (BID) | ORAL | Status: DC
  Administered 2016-10-10 – 2016-10-11 (×3): 200 mg via ORAL
  Filled 2016-10-10 (×3): qty 1

## 2016-10-10 MED ORDER — METHYLPREDNISOLONE SODIUM SUCC 125 MG IJ SOLR
125.0000 mg | Freq: Once | INTRAMUSCULAR | Status: AC
  Administered 2016-10-10: 125 mg via INTRAVENOUS

## 2016-10-10 MED ORDER — MIDAZOLAM HCL 5 MG/5ML IJ SOLN
INTRAMUSCULAR | Status: AC
  Filled 2016-10-10: qty 5

## 2016-10-10 MED ORDER — ASPIRIN EC 81 MG PO TBEC
81.0000 mg | DELAYED_RELEASE_TABLET | Freq: Every day | ORAL | Status: DC
  Administered 2016-10-11: 81 mg via ORAL
  Filled 2016-10-10: qty 1

## 2016-10-10 MED ORDER — FAMOTIDINE IN NACL 20-0.9 MG/50ML-% IV SOLN
20.0000 mg | Freq: Once | INTRAVENOUS | Status: AC
  Administered 2016-10-10: 20 mg via INTRAVENOUS

## 2016-10-10 MED ORDER — ONDANSETRON HCL 4 MG/2ML IJ SOLN: 4.0000 mg | Freq: Four times a day (QID) | INTRAMUSCULAR | Status: DC | PRN

## 2016-10-10 MED ORDER — DIPHENHYDRAMINE HCL 50 MG/ML IJ SOLN
25.0000 mg | Freq: Once | INTRAMUSCULAR | Status: AC
  Administered 2016-10-10: 25 mg via INTRAVENOUS

## 2016-10-10 MED ORDER — LOSARTAN POTASSIUM 25 MG PO TABS
25.0000 mg | ORAL_TABLET | Freq: Every day | ORAL | Status: DC
  Administered 2016-10-10 – 2016-10-11 (×2): 25 mg via ORAL
  Filled 2016-10-10 (×3): qty 1

## 2016-10-10 MED ORDER — FENTANYL CITRATE (PF) 100 MCG/2ML IJ SOLN
INTRAMUSCULAR | Status: DC | PRN
  Administered 2016-10-10: 50 ug via INTRAVENOUS

## 2016-10-10 MED ORDER — LIDOCAINE HCL (PF) 1 % IJ SOLN
INTRAMUSCULAR | Status: DC | PRN
  Administered 2016-10-10: 55 mL

## 2016-10-10 MED ORDER — ASPIRIN 81 MG PO TBEC: 81.0000 mg | DELAYED_RELEASE_TABLET | Freq: Every day | ORAL | Status: DC

## 2016-10-10 MED ORDER — FENTANYL CITRATE (PF) 100 MCG/2ML IJ SOLN
INTRAMUSCULAR | Status: AC
  Filled 2016-10-10: qty 2

## 2016-10-10 MED ORDER — LIDOCAINE HCL (PF) 1 % IJ SOLN
INTRAMUSCULAR | Status: AC
  Filled 2016-10-10: qty 60

## 2016-10-10 MED ORDER — ATORVASTATIN CALCIUM 40 MG PO TABS
40.0000 mg | ORAL_TABLET | Freq: Every day | ORAL | Status: DC
  Administered 2016-10-10: 40 mg via ORAL
  Filled 2016-10-10: qty 1

## 2016-10-10 MED ORDER — CEFAZOLIN IN D5W 1 GM/50ML IV SOLN
1.0000 g | Freq: Four times a day (QID) | INTRAVENOUS | Status: AC
  Administered 2016-10-10 – 2016-10-11 (×3): 1 g via INTRAVENOUS
  Filled 2016-10-10 (×3): qty 50

## 2016-10-10 MED ORDER — ACETAMINOPHEN 325 MG PO TABS: 325.0000 mg | ORAL_TABLET | ORAL | Status: DC | PRN

## 2016-10-10 MED ORDER — AMIODARONE HCL 200 MG PO TABS
200.0000 mg | ORAL_TABLET | Freq: Every day | ORAL | Status: DC
  Administered 2016-10-10 – 2016-10-11 (×2): 200 mg via ORAL
  Filled 2016-10-10 (×2): qty 1

## 2016-10-10 MED ORDER — METHYLPREDNISOLONE SODIUM SUCC 125 MG IJ SOLR
INTRAMUSCULAR | Status: AC
  Filled 2016-10-10: qty 2

## 2016-10-10 MED ORDER — CHOLECALCIFEROL 25 MCG (1000 UT) PO CAPS: 1000.0000 [IU] | ORAL_CAPSULE | Freq: Every day | ORAL | Status: DC

## 2016-10-10 MED ORDER — SODIUM CHLORIDE 0.9 % IR SOLN
Status: AC
  Filled 2016-10-10: qty 2

## 2016-10-10 MED ORDER — MUPIROCIN 2 % EX OINT
TOPICAL_OINTMENT | CUTANEOUS | Status: AC
  Administered 2016-10-10: 1
  Filled 2016-10-10: qty 22

## 2016-10-10 MED ORDER — ADULT MULTIVITAMIN W/MINERALS CH
1.0000 | ORAL_TABLET | Freq: Every day | ORAL | Status: DC
  Administered 2016-10-10 – 2016-10-11 (×2): 1 via ORAL
  Filled 2016-10-10 (×2): qty 1

## 2016-10-10 MED ORDER — SODIUM CHLORIDE 0.9 % IV SOLN
INTRAVENOUS | Status: DC
  Administered 2016-10-10: 06:00:00 via INTRAVENOUS

## 2016-10-10 MED ORDER — SODIUM CHLORIDE 0.9 % IR SOLN
80.0000 mg | Status: AC
  Administered 2016-10-10: 80 mg
  Filled 2016-10-10: qty 2

## 2016-10-10 MED ORDER — CARVEDILOL 12.5 MG PO TABS
12.5000 mg | ORAL_TABLET | Freq: Two times a day (BID) | ORAL | Status: DC
  Administered 2016-10-10 – 2016-10-11 (×3): 12.5 mg via ORAL
  Filled 2016-10-10 (×5): qty 1

## 2016-10-10 MED ORDER — VITAMIN D 1000 UNITS PO TABS
1000.0000 [IU] | ORAL_TABLET | Freq: Every day | ORAL | Status: DC
  Administered 2016-10-10 – 2016-10-11 (×2): 1000 [IU] via ORAL
  Filled 2016-10-10 (×2): qty 1

## 2016-10-10 MED ORDER — DEXTROSE 5 % IV SOLN
3.0000 g | INTRAVENOUS | Status: AC
  Administered 2016-10-10: 3 g via INTRAVENOUS
  Filled 2016-10-10 (×2): qty 3000

## 2016-10-10 MED ORDER — POTASSIUM CHLORIDE CRYS ER 20 MEQ PO TBCR
20.0000 meq | EXTENDED_RELEASE_TABLET | Freq: Every day | ORAL | Status: DC
  Administered 2016-10-10 – 2016-10-11 (×2): 20 meq via ORAL
  Filled 2016-10-10 (×2): qty 1

## 2016-10-10 MED ORDER — FAMOTIDINE IN NACL 20-0.9 MG/50ML-% IV SOLN
INTRAVENOUS | Status: AC
  Filled 2016-10-10: qty 50

## 2016-10-10 MED ORDER — ALLOPURINOL 300 MG PO TABS
300.0000 mg | ORAL_TABLET | Freq: Every day | ORAL | Status: DC
  Administered 2016-10-10 – 2016-10-11 (×2): 300 mg via ORAL
  Filled 2016-10-10 (×2): qty 1

## 2016-10-10 MED ORDER — FUROSEMIDE 20 MG PO TABS
20.0000 mg | ORAL_TABLET | Freq: Every day | ORAL | Status: DC
  Administered 2016-10-10 – 2016-10-11 (×2): 20 mg via ORAL
  Filled 2016-10-10 (×2): qty 1

## 2016-10-10 MED ORDER — HEPARIN (PORCINE) IN NACL 2-0.9 UNIT/ML-% IJ SOLN
INTRAMUSCULAR | Status: AC
  Filled 2016-10-10: qty 500

## 2016-10-10 SURGICAL SUPPLY — 9 items
CABLE SURGICAL S-101-97-12 (CABLE) ×1 IMPLANT
HOVERMATT SINGLE USE (MISCELLANEOUS) ×1 IMPLANT
ICD VISIA MRI VR DVFB1D4 (ICD Generator) IMPLANT
LEAD SPRINT QUAT SEC 6935M-55 (Lead) ×1 IMPLANT
PAD DEFIB LIFELINK (PAD) ×1 IMPLANT
SHEATH CLASSIC 9F (SHEATH) ×2 IMPLANT
TRAY PACEMAKER INSERTION (PACKS) ×1 IMPLANT
VISIA MRI VR DVFB1D4 (ICD Generator) ×2 IMPLANT
WIRE HI TORQ VERSACORE-J 145CM (WIRE) ×1 IMPLANT

## 2016-10-10 NOTE — Progress Notes (Signed)
[facility]  Patient Status: Active (receiving services up to time of hospitalization)  [facility] is providing the following services: RN, PT, OT and ST  If patient discharges after hours, please call [phone].   Dean [name] 10/10/2016, 2:14 PM

## 2016-10-10 NOTE — Progress Notes (Signed)
Pt and family oriented to room and equipment. Pt and family instructed on bed rest until 3pm. VSS - obtained manual BP to verify information on dinamap. Lunch tray ordered. Telemetry applied, CCMD notified.   Fritz Pickerel, RN

## 2016-10-10 NOTE — H&P (Signed)
Dean [name] is a 66 y.o. male with heart failrue who presents to the hospital after cardiac arrest. He has been wearing a life vest. He has had no shocks from his LifeVest since discharge.  Presenting today for ICD placement.  Risks and benefits discussed.  Risks include but not limited to bleeding, infection, tamponade, pneumothorax.  The patienet understands the risks and has agreed to the procedure. Of note, he does have a contrast allergy and thus has been given pretreatment for his allergy.  ICD Criteria  Current LVEF:35-40%. Within 12 months prior to implant: Yes   Heart failure history: Yes, Class II  Cardiomyopathy history: Yes, Ischemic Cardiomyopathy - Prior MI.  Atrial Fibrillation/Atrial Flutter: No.  Ventricular tachycardia history: Yes, Hemodynamic instability present. VT Type: Sustained Ventricular Tachycardia - Monomorphic.  Cardiac arrest history: Yes, Ventricular Tachycardia.  History of syndromes with risk of sudden death: No.  Previous ICD: No.  Current ICD indication: Secondary  PPM indication: No.   Class I or II Bradycardia indication present: No  Beta Blocker therapy for 3 or more months: Yes, prescribed.   Ace Inhibitor/ARB therapy for 3 or more months: Yes, prescribed.   Dean Segall Curt Bears, MD 10/10/2016 7:10 AM

## 2016-10-11 ENCOUNTER — Encounter (HOSPITAL_COMMUNITY): Payer: Self-pay | Admitting: General Practice

## 2016-10-11 ENCOUNTER — Ambulatory Visit (HOSPITAL_COMMUNITY): Payer: Medicare HMO

## 2016-10-11 DIAGNOSIS — Z95810 Presence of automatic (implantable) cardiac defibrillator: Secondary | ICD-10-CM

## 2016-10-11 DIAGNOSIS — I2582 Chronic total occlusion of coronary artery: Secondary | ICD-10-CM | POA: Diagnosis not present

## 2016-10-11 DIAGNOSIS — I255 Ischemic cardiomyopathy: Secondary | ICD-10-CM | POA: Diagnosis not present

## 2016-10-11 DIAGNOSIS — I447 Left bundle-branch block, unspecified: Secondary | ICD-10-CM | POA: Diagnosis not present

## 2016-10-11 DIAGNOSIS — I2510 Atherosclerotic heart disease of native coronary artery without angina pectoris: Secondary | ICD-10-CM | POA: Diagnosis not present

## 2016-10-11 MED ORDER — CARVEDILOL 25 MG PO TABS: 25.0000 mg | ORAL_TABLET | Freq: Two times a day (BID) | ORAL | Status: DC

## 2016-10-11 MED ORDER — CARVEDILOL 25 MG PO TABS: 25.0000 mg | ORAL_TABLET | Freq: Two times a day (BID) | ORAL | 5 refills | Status: DC

## 2016-10-11 NOTE — Discharge Instructions (Signed)
° ° °  Supplemental Discharge Instructions for  Pacemaker/Defibrillator Patients  Activity No heavy lifting or vigorous activity with your left/right arm for 6 to 8 weeks.  Do not raise your left/right arm above your head for one week.  Gradually raise your affected arm as drawn below.             10/14/16                    10/15/16                     10/16/16                  10/17/16 __  NO DRIVING for 1 week    ; you may begin driving on  [id number]   .  WOUND CARE - Keep the wound area clean and dry.  Do not get this area wet for one week. No showers for one week; you may shower on 10/17/16    . - The tape/steri-strips on your wound will fall off; do not pull them off.  No bandage is needed on the site.  DO  NOT apply any creams, oils, or ointments to the wound area. - If you notice any drainage or discharge from the wound, any swelling or bruising at the site, or you develop a fever > 101? F after you are discharged home, call the office at once.  Special Instructions - You are still able to use cellular telephones; use the ear opposite the side where you have your pacemaker/defibrillator.  Avoid carrying your cellular phone near your device. - When traveling through airports, show security personnel your identification card to avoid being screened in the metal detectors.  Ask the security personnel to use the hand wand. - Avoid arc welding equipment, MRI testing (magnetic resonance imaging), TENS units (transcutaneous nerve stimulators).  Call the office for questions about other devices. - Avoid electrical appliances that are in poor condition or are not properly grounded. - Microwave ovens are safe to be near or to operate.  Additional information for defibrillator patients should your device go off: - If your device goes off ONCE and you feel fine afterward, notify the device clinic nurses. - If your device goes off ONCE and you do not feel well afterward, call 911. - If your device goes  off TWICE, call 911. - If your device goes off THREE times in one day, call 911.  DO NOT DRIVE YOURSELF OR A FAMILY MEMBER WITH A DEFIBRILLATOR TO THE HOSPITAL--CALL 911.

## 2016-10-11 NOTE — Progress Notes (Signed)
Discussed with the patient and all questioned fully answered. He will call me if any problems arise.  IV removed. Telemetry removed. Pt given discharge paper work - presscriptions sent to pharmacy. Pt and wife verbalized understanding of all teaching.   Fritz Pickerel, RN

## 2016-10-11 NOTE — Discharge Summary (Signed)
ELECTROPHYSIOLOGY PROCEDURE DISCHARGE SUMMARY    Patient ID: Dean [name],  MRN: [id number], DOB/AGE: 1951-07-25 66 y.o.  Admit date: 10/10/2016 Discharge date: 10/11/2016  Primary Cardiologist: Dr. Debara Pickett Electrophysiologist: Dr. Curt Bears  Primary Discharge Diagnosis:  Ischemic Cardiomyopathy  Secondary Discharge Diagnosis:  CHF (congestive heart failure) ([place]) ICD (implantable cardioverter-defibrillator) in place  Allergies  Allergen Reactions  . Contrast Media [Iodinated Diagnostic Agents] Shortness Of Breath and Nausea And Vomiting  . Lisinopril     cough     Procedures This Admission:  1.  Implantation of a Medtronic ICD on 10/10/2016 by Dr. Curt Bears.  The patient received a Medtronic Visia AF MRI (serial  Number [id number]) ICD with a Medtronic, model [id number] (serial number [id number]) right ventricular defibrillator lead. There were no immediate post procedure complications. 2.  CXR on 10/12/2016 demonstrated no pneumothorax status post device implantation.   Brief HPI: Dean [name] is a 66 y.o. male who was referred to electrophysiology in the outpatient setting for consideration of ICD implantation.  Past medical history includes CAD (90% ostRCA stenosis and total occlusion of dRCA with L-->R collaterals), recent cardiac arrest (occuring in 08/2016), chronic combined systolic and diastolic CHF (EF [id number] by echo in 08/2016), Type 2 DM and OSA . The patient has persistent LV dysfunction despite guideline directed therapy.  Risks, benefits, and alternatives to ICD implantation were reviewed with the patient who wished to proceed.   Hospital Course:  The patient was admitted and underwent implantation of a Medtronic ICD with details as outlined above. He was monitored on telemetry overnight which demonstrated NSR with frequent PVC's.  Left chest was without hematoma or ecchymosis.  The device was interrogated and found to be functioning normally.  CXR was obtained and  demonstrated no pneumothorax status post device implantation.  Wound care, arm mobility, and restrictions were reviewed with the patient.  The patient was examined and considered stable for discharge to home.   The patient's discharge medications include an ARB (Losartan 25mg  daily) and beta blocker (Coreg 12.5mg  BID which was titrated to 25mg  BID during this admission). A Wound Check has been arranged for 10/23/2016.  Physical Exam: Vitals:   10/10/16 1305 10/10/16 1505 10/10/16 1945 10/11/16 0501  BP: 134/86 (!) 167/51 129/88 (!) 131/49  Pulse: 64 68 73 77  Resp:   18 18  Temp:   97.8 F (36.6 C) 98.2 F (36.8 C)  TempSrc:   Oral Oral  SpO2: 100% 98% 100% 99%  Weight:      Height:        GEN- The patient is well appearing, African American male, alert and oriented x 3 today.   HEENT: normocephalic, atraumatic; sclera clear, conjunctiva pink; hearing intact; oropharynx clear; neck supple, no JVP Lymph- no cervical lymphadenopathy Lungs- Clear to ausculation bilaterally, normal work of breathing.  No wheezes, rales, rhonchi Heart- Regular rate and rhythm, no murmurs, rubs or gallops, PMI not laterally displaced GI- soft, non-tender, non-distended, bowel sounds present, no hepatosplenomegaly Extremities- no clubbing, cyanosis, or edema; DP/PT/radial pulses 2+ bilaterally MS- no significant deformity or atrophy Skin- warm and dry, no rash or lesion, left chest without hematoma/ecchymosis Psych- euthymic mood, full affect Neuro- strength and sensation are intact  Labs:   Lab Results  Component Value Date   WBC 9.1 10/05/2016   HGB 10.9 (L) 09/21/2016   HCT 35.9 (L) 10/05/2016   MCV 83 10/05/2016   PLT 152 10/05/2016     Recent Labs Lab 10/05/16 1111  NA  140  K 5.2  CL 100  CO2 22  BUN 21  CREATININE 1.36*  CALCIUM 10.6*  GLUCOSE 94    Discharge Medications:  Allergies as of 10/11/2016      Reactions   Contrast Media [iodinated Diagnostic Agents] Shortness Of  Breath, Nausea And Vomiting   Lisinopril    cough      Medication List    STOP taking these medications   sulfamethoxazole-trimethoprim 800-160 MG tablet Commonly known as:  BACTRIM DS,SEPTRA DS     TAKE these medications   allopurinol 300 MG tablet Commonly known as:  ZYLOPRIM Take 300 mg by mouth daily.   amiodarone 200 MG tablet Commonly known as:  PACERONE Take 1 tablet (200 mg total) by mouth daily.   aspirin 81 MG EC tablet Take 1 tablet (81 mg total) by mouth daily.   atorvastatin 40 MG tablet Commonly known as:  LIPITOR Take 1 tablet (40 mg total) by mouth daily at 6 PM.   carvedilol 25 MG tablet Commonly known as:  COREG Take 1 tablet (25 mg total) by mouth 2 (two) times daily with a meal. What changed:  medication strength  how much to take   CVS VITAMIN D3 1000 units capsule Generic drug:  Cholecalciferol Take 1,000 Units by mouth daily.   famotidine 20 MG tablet Commonly known as:  PEPCID Take 1 tablet (20 mg total) by mouth 2 (two) times daily.   furosemide 20 MG tablet Commonly known as:  LASIX Take 1 tablet (20 mg total) by mouth daily.   losartan 25 MG tablet Commonly known as:  COZAAR Take 1 tablet (25 mg total) by mouth daily.   magnesium oxide 400 (241.3 Mg) MG tablet Commonly known as:  MAG-OX Take 0.5 tablets (200 mg total) by mouth 2 (two) times daily.   metFORMIN 500 MG tablet Commonly known as:  GLUCOPHAGE Take 1 tablet (500 mg total) by mouth daily with breakfast.   multivitamin with minerals Tabs tablet Take 1 tablet by mouth daily.   potassium chloride SA 20 MEQ tablet Commonly known as:  K-DUR,KLOR-CON Take 1 tablet (20 mEq total) by mouth daily.       Disposition:   Follow-up Information    [facility] Follow up on 10/23/2016.   Specialty:  Cardiology Why:  9:00AM, wound check Contact information: [address] [place] [phone]           Duration of Discharge Encounter: Greater than 30 minutes including physician time.  Signed, Erma Heritage, PA-C 10/11/2016, 10:13 AM Pager: [phone]  I have seen and examined this patient with [place].  Agree with above, note added to reflect my findings.  On exam, regular rhythm, no murmurs, lungs clear. Patient presented for ICD placement for secondary prevention.  CXR and interrogation without issues. Plan for discharge today with follow up in device clinic.    Kelleigh Skerritt M. Garnette Greb MD 10/11/2016 1:32 PM

## 2016-10-11 NOTE — Progress Notes (Signed)
Patient Name: Dean [name] Date of Encounter: 10/11/2016  Primary Cardiologist: Dr. Debara Pickett Primary Electrophysiologist: Dr. [facility] Problem List     Principal Problem:   Cardiomyopathy, ischemic Active Problems:   CHF (congestive heart failure) ([place])   ICD (implantable cardioverter-defibrillator) in place    Subjective   Denies any chest discomfort or palpitations. Minimal pain along ICD site.  Anxious to go home.   Inpatient Medications    Scheduled Meds: . allopurinol  300 mg Oral Daily  . amiodarone  200 mg Oral Daily  . aspirin EC  81 mg Oral Daily  . atorvastatin  40 mg Oral q1800  . carvedilol  12.5 mg Oral BID WC  . cholecalciferol  1,000 Units Oral Daily  . famotidine  20 mg Oral BID  . furosemide  20 mg Oral Daily  . losartan  25 mg Oral Daily  . magnesium oxide  200 mg Oral BID  . metFORMIN  500 mg Oral Q breakfast  . multivitamin with minerals  1 tablet Oral Daily  . potassium chloride SA  20 mEq Oral Daily   Continuous Infusions:  PRN Meds: acetaminophen, ondansetron (ZOFRAN) IV   Vital Signs    Vitals:   10/10/16 1305 10/10/16 1505 10/10/16 1945 10/11/16 0501  BP: 134/86 (!) 167/51 129/88 (!) 131/49  Pulse: 64 68 73 77  Resp:   18 18  Temp:   97.8 F (36.6 C) 98.2 F (36.8 C)  TempSrc:   Oral Oral  SpO2: 100% 98% 100% 99%  Weight:      Height:        Intake/Output Summary (Last 24 hours) at 10/11/16 0851 Last data filed at 10/10/16 2025  Gross per 24 hour  Intake              600 ml  Output             1390 ml  Net             -790 ml   Filed Weights   10/10/16 0538  Weight: 257 lb (116.6 kg)    Physical Exam    GEN: Well nourished, well developed African American male appearing in no acute distress.  HEENT: Grossly normal.  Neck: Supple, no JVD, carotid bruits, or masses. Cardiac: RRR, no murmurs, rubs, or gallops. No clubbing, cyanosis, edema.  Radials/DP/PT 2+ and equal bilaterally. Dressing in place with no  evidence of drainage.  Respiratory:  Respirations regular and unlabored, clear to auscultation bilaterally. GI: Soft, nontender, nondistended, BS + x 4. MS: no deformity or atrophy. Skin: warm and dry, no rash. Neuro:  Strength and sensation are intact. Psych: AAOx3.  Normal affect.  Labs    None Obtained.   Telemetry    NSR, HR in 60's - 70's. Frequent PVC's. Episode of V-pacing earlier this AM.  - Personally Reviewed  ECG    NSR, HR 76, with known LBBB. - Personally Reviewed  Radiology    No results found.  Cardiac Studies   Cardiac Catheterization: 09/07/2016  There is moderate left ventricular systolic dysfunction.  LV end diastolic pressure is normal.  The left ventricular ejection fraction is 35-45% by visual estimate.  Ost RCA lesion, 90 %stenosed.  Mid RCA to Dist RCA lesion, 100 %stenosed.   1. Single vessel occlusive CAD. The distal RCA is occluded with left to right collaterals. There is a 90% ostial stenosis 2. Moderate LV dysfunction with EF 35-40%. Akinesis of the basal to mid  inferior wall 3. Normal LVEDP  Plan: medical therapy. If he has significant angina we could consider PCI of the RCA but there appears to be significant scar of the inferior wall.   ICD Placement: 10/11/2015  DESCRIPTION OF PROCEDURE:  Informed written consent was obtained and the patient was brought to the electrophysiology lab in the fasting state. The patient was adequately sedated with intravenous Versed, and fentanyl as outlined in the nursing report.  The patient's left chest was prepped and draped in the usual sterile fashion by the EP lab staff.  The skin overlying the left deltopectoral region was infiltrated with lidocaine for local analgesia.  A 5-cm incision was made over the left deltopectoral region.  A left subcutaneous defibrillator pocket was fashioned using a combination of sharp and blunt dissection.  Electrocautery was used to assure hemostasis.   RV Lead  Placement: The left axillary vein was cannulated with fluoroscopic visualization.  Through the left axillary vein, a Medtronic, model [id number] (serial number [id number]) right ventricular defibrillator lead was advanced with fluoroscopic visualization into the right ventricular apex positions.  The right ventricular lead R-wave measured 10.1 mV with impedance of 493 ohms and a threshold of 0.6 volts at 0.5 milliseconds.   The leads were secured to the pectoralis  fascia using #2 silk suture over the suture sleeves.  The pocket then  irrigated with copious gentamicin solution.  The leads were then  connected to a Medtronic Visia AF MRI (serial  Number [id number]) ICD.  The defibrillator was placed into the  pocket.  The pocket was then closed in 3 layers with 2.0 Vicryl suture  for the subcutaneous and 3.0 Vicryl suture subcuticular layers. EBL<4ml.  Steri-Strips and a  sterile dressing were then applied.    CONCLUSIONS:   1. Ischemic cardiomyopathy with chronic New York Heart Association class II heart failure.   2. Successful ICD implantation.   3. No early apparent complications.   Patient Profile     66 yo male w/ PMH of CAD (90% ostRCA stenosis and total occlusion of dRCA with L-->R collaterals), recent cardiac arrest (occuring in 08/2016), chronic combined systolic and diastolic CHF (EF [id number] by echo in 08/2016), Type 2 DM and OSA who presented on 1/16 for ICD placement. Had been discharged on LifeVest due to Citrobacter Koseri bacteremia.   Assessment & Plan    1. VT/VF arrest - experienced out of hospital arrest in 08/2016 with ICD placement being delayed secondary to Citrobacter Koseri bacteremia.  - presented for ICD placement on 10/10/2016. Medtronic Visia AF MRI (serial  Number [id number]) ICD placed without immediate complications noted.  - repeat CXR this morning. Alessandra Sawdey arrange for wound check within 10 days.   2. Ischemic Cardiomyopathy - EF 35-40% by echo in 08/2016. - remains  on Coreg 12.5mg  BID and Losartan 25mg  daily. Lavanya Roa increase Coreg to 25mg  BID.   3. CAD - cath in 08/2016 showed 90% ostRCA stenosis and total occlusion of dRCA with L-->R collaterals. - continue ASA, BB, and statin.   Signed, Erma Heritage, PA  10/11/2016, 8:51 AM    I have seen and examined this patient with [place].  Agree with above, note added to reflect my findings.  On exam, regular rhythm, no murmurs, lungs clear. Patient presented for ICD placement for secondary prevention.  CXR and interrogation without issues. Plan for discharge today with follow up in device clinic.    Shepard Keltz M. Devanta Daniel MD 10/11/2016 1:32 PM

## 2016-10-12 ENCOUNTER — Other Ambulatory Visit: Payer: Self-pay | Admitting: Cardiology

## 2016-10-23 ENCOUNTER — Telehealth: Payer: Self-pay

## 2016-10-23 ENCOUNTER — Ambulatory Visit (INDEPENDENT_AMBULATORY_CARE_PROVIDER_SITE_OTHER): Payer: Medicare HMO | Admitting: *Deleted

## 2016-10-23 DIAGNOSIS — Z95810 Presence of automatic (implantable) cardiac defibrillator: Secondary | ICD-10-CM

## 2016-10-23 DIAGNOSIS — I255 Ischemic cardiomyopathy: Secondary | ICD-10-CM

## 2016-10-23 LAB — CUP PACEART INCLINIC DEVICE CHECK
Battery Remaining Longevity: 138 mo
Battery Voltage: 3.16 V
Brady Statistic RV Percent Paced: 0.01 %
Date Time Interrogation Session: 20180129135255
HighPow Impedance: 56 Ohm
Implantable Lead Implant Date: 20180116
Implantable Lead Location: 753860
Implantable Lead Model: 6935
Implantable Pulse Generator Implant Date: 20180116
Lead Channel Impedance Value: 304 Ohm
Lead Channel Impedance Value: 380 Ohm
Lead Channel Pacing Threshold Amplitude: 0.75 V
Lead Channel Pacing Threshold Pulse Width: 0.4 ms
Lead Channel Sensing Intrinsic Amplitude: 10.75 mV
Lead Channel Setting Pacing Amplitude: 3.5 V
Lead Channel Setting Pacing Pulse Width: 0.4 ms
Lead Channel Setting Sensing Sensitivity: 0.3 mV

## 2016-10-23 NOTE — Telephone Encounter (Signed)
Have received multiple faxes from [facility] for a refill of ALL of this patients medications, have sent back fax to [facility] stating that these medication need to be refilled by patients primary care doctor

## 2016-10-23 NOTE — Progress Notes (Signed)
Wound check appointment. Steri-strips removed. Wound without redness or edema. Incision edges approximated, wound well healed. Normal device function. Thresholds, sensing, and impedances consistent with implant measurements. Device programmed at 3.5V for extra safety margin until 3 month visit. Histogram distribution appropriate for patient and level of activity. No AF or ventricular arrhythmias noted. Patient educated about wound care, arm mobility, lifting restrictions, shock plan. ROV with WC on 01/04/17.

## 2016-10-24 ENCOUNTER — Other Ambulatory Visit: Payer: Self-pay | Admitting: *Deleted

## 2016-10-24 NOTE — Telephone Encounter (Signed)
Patient called and requested the following refills. Dr Curt Bears has never filled these for the patient. Okay to refill? Please advise. Thanks, MI

## 2016-10-25 DIAGNOSIS — G931 Anoxic brain damage, not elsewhere classified: Secondary | ICD-10-CM | POA: Diagnosis not present

## 2016-10-25 DIAGNOSIS — I5021 Acute systolic (congestive) heart failure: Secondary | ICD-10-CM

## 2016-10-25 DIAGNOSIS — I130 Hypertensive heart and chronic kidney disease with heart failure and stage 1 through stage 4 chronic kidney disease, or unspecified chronic kidney disease: Secondary | ICD-10-CM | POA: Diagnosis not present

## 2016-10-25 DIAGNOSIS — E1122 Type 2 diabetes mellitus with diabetic chronic kidney disease: Secondary | ICD-10-CM

## 2016-10-25 NOTE — Telephone Encounter (Signed)
Routing to Dr. Curt Bears for advisement. (do you want to refill or send to primary card to refill?? Pt does not have appt w/ primary card yet)  New pt to [facility] -- implanted ICD 10/10/16.  According to d/c paperwork - primary card is [place] (new) and EP is [place] (new).

## 2016-10-26 ENCOUNTER — Telehealth: Payer: Self-pay | Admitting: *Deleted

## 2016-10-26 NOTE — Telephone Encounter (Signed)
Nurse from [facility] l;eft a message to report a high blood pressure reading of 170/90 that decreased to 164/70 before she left the patients home.  FYI

## 2016-10-27 MED ORDER — FAMOTIDINE 20 MG PO TABS: 20.0000 mg | ORAL_TABLET | Freq: Two times a day (BID) | ORAL | 5 refills | Status: DC

## 2016-10-27 MED ORDER — AMIODARONE HCL 200 MG PO TABS: 200.0000 mg | ORAL_TABLET | Freq: Every day | ORAL | 5 refills | Status: DC

## 2016-10-27 MED ORDER — ATORVASTATIN CALCIUM 40 MG PO TABS: 40.0000 mg | ORAL_TABLET | Freq: Every day | ORAL | 5 refills | Status: DC

## 2016-10-27 MED ORDER — POTASSIUM CHLORIDE CRYS ER 20 MEQ PO TBCR: 20.0000 meq | EXTENDED_RELEASE_TABLET | Freq: Every day | ORAL | 5 refills | Status: DC

## 2016-10-27 MED ORDER — LOSARTAN POTASSIUM 25 MG PO TABS: 25.0000 mg | ORAL_TABLET | Freq: Every day | ORAL | 5 refills | Status: DC

## 2016-10-27 NOTE — Telephone Encounter (Signed)
Ok to give refills for 6 months.  (hopefully pt has established w/ general cardiologist by then for future refills)

## 2016-12-11 ENCOUNTER — Ambulatory Visit
Admission: RE | Admit: 2016-12-11 | Discharge: 2016-12-11 | Disposition: A | Discharge status: Home / Self Care | Payer: Medicare HMO | Source: Ambulatory Visit | Attending: Internal Medicine | Admitting: Internal Medicine

## 2016-12-11 ENCOUNTER — Other Ambulatory Visit: Payer: Self-pay | Admitting: Internal Medicine

## 2016-12-11 DIAGNOSIS — R0602 Shortness of breath: Secondary | ICD-10-CM

## 2017-01-04 ENCOUNTER — Encounter: Payer: Self-pay | Admitting: Cardiology

## 2017-01-04 ENCOUNTER — Ambulatory Visit (INDEPENDENT_AMBULATORY_CARE_PROVIDER_SITE_OTHER): Payer: Medicare HMO | Admitting: Cardiology

## 2017-01-04 VITALS — BP 132/68 | HR 63 | Ht 66.0 in | Wt 268.4 lb

## 2017-01-04 DIAGNOSIS — I472 Ventricular tachycardia, unspecified: Secondary | ICD-10-CM

## 2017-01-04 DIAGNOSIS — I255 Ischemic cardiomyopathy: Secondary | ICD-10-CM

## 2017-01-04 NOTE — Patient Instructions (Signed)
Medication Instructions: Your physician recommends that you continue on your current medications as directed. Please refer to the Current Medication list given to you today.   Labwork: None ordered  Procedures/Testing: None ordered  Follow-Up: Your physician wants you to follow-up in: 6 months with Dr. Curt Bears. You will receive a reminder letter in the mail two months in advance. If you don't receive a letter, please call our office to schedule the follow-up appointment.   Remote monitoring is used to monitor your Pacemaker of ICD from home. This monitoring reduces the number of office visits required to check your device to one time per year. It allows [place] to keep an eye on the functioning of your device to ensure it is working properly. You are scheduled for a device check from home on April 05, 2017. You may send your transmission at any time that day. If you have a wireless device, the transmission will be sent automatically. After your physician reviews your transmission, you will receive a postcard with your next transmission date.    Any Additional Special Instructions Will Be Listed Below (If Applicable).     If you need a refill on your cardiac medications before your next appointment, please call your pharmacy.

## 2017-01-04 NOTE — Progress Notes (Signed)
Electrophysiology Office Note   Date:  01/04/2017   ID:  Dean [name], DOB Dean 07, 1952, MRN [id number]  PCP:  [name], MD Primary Electrophysiologist:  [name], MD    No chief complaint on file.    History of Present Illness: Dean [name] is a 66 y.o. male who presents today for electrophysiology evaluation.   Hx asthma, T2DM, asthma, gout, HTN who was admitted on 08/27/2016 after he collapsed walking back from the bathroom. CPR not performed by family. He was treated with CPR and ALS protocol and he coded again en route to the hospital.  He had episode of NSVT in CT scanner and treated with amiodarone. EKG with inferior T wave inversions and was treated with [employer] protocol and elevated troponin felt to be due to shock applied for VT/VF. Cardiac cath 12/14 revealed 90% ostial stenosis distal R-RCA with left to right collaterals and 100% stenosis mid RCA to distal RCA lesion. ICD implanted 10/10/16.   Today, he denies symptoms of palpitations, chest pain, shortness of breath, orthopnea, PND, lower extremity edema, claudication, presyncope, syncope, bleeding, or neurologic sequela. The patient is tolerating medications without difficulties and is otherwise without complaint today. He had some congestion in his chest as well as low extremity edema. He saw his primary physician who increased his Lasix. Otherwise he is doing well.   Past Medical History:  Diagnosis Date  . 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 Arthritis    "maybe in his spine" (09/05/2016)  . Asthma   . Cardiac arrest ([place]) 08/27/2016   REQUIRING CPR, SHOCK & MEDICATIONS  . Gout   . High cholesterol   . Hypertension   . OSA on CPAP   . Type II diabetes mellitus ([place])    Past Surgical History:  Procedure Laterality Date  . CARDIAC CATHETERIZATION N/A 09/07/2016   Procedure: Left Heart Cath and Coronary Angiography;  Surgeon: Peter M Martinique, MD;  Location: [facility];  Service: Cardiovascular;  Laterality: N/A;  . EP IMPLANTABLE DEVICE N/A 10/10/2016   Procedure: ICD Implant;  Surgeon: Corlene Sabia Meredith Leeds, MD;  Location: [facility];  Service: Cardiovascular;  Laterality: N/A;     Current Outpatient Prescriptions  Medication Sig Dispense Refill  . allopurinol (ZYLOPRIM) 300 MG tablet Take 300 mg by mouth daily.    [name] amiodarone (PACERONE) 200 MG tablet Take 1 tablet (200 mg total) by mouth daily. 30 tablet 5  . aspirin EC 81 MG EC tablet Take 1 tablet (81 mg total) by mouth daily.    [name] atorvastatin (LIPITOR) 40 MG tablet Take 1 tablet (40 mg total) by mouth daily at 6 PM. 30 tablet 5  . carvedilol (COREG) 25 MG tablet Take 1 tablet (25 mg total) by mouth 2 (two) times daily with a meal. 60 tablet 5  . CVS VITAMIN D3 1000 units capsule Take 1,000 Units by mouth daily.  5  . famotidine (PEPCID) 20 MG tablet Take 1 tablet (20 mg total) by mouth 2 (two) times daily. 60 tablet 5  . furosemide (LASIX) 20 MG tablet Take 1 tablet (20 mg total) by mouth daily. 30 tablet 0  . losartan (COZAAR) 25 MG tablet Take 1 tablet (25 mg total) by mouth daily. 30 tablet 5  . magnesium oxide (MAG-OX) 400 (241.3 Mg) MG tablet Take 0.5 tablets (200 mg total) by mouth 2 (two) times daily. 60 tablet 0  . metFORMIN (GLUCOPHAGE) 500 MG tablet Take 1 tablet (500 mg total)  by mouth daily with breakfast. 30 tablet 0  . Multiple Vitamin (MULTIVITAMIN WITH MINERALS) TABS tablet Take 1 tablet by mouth daily.    . potassium chloride SA (K-DUR,KLOR-CON) 20 MEQ tablet Take 1 tablet (20 mEq total) by mouth daily. 30 tablet 5   No current facility-administered medications for this visit.     Allergies:   Contrast media [iodinated diagnostic agents] and Lisinopril   Social History:  The patient  reports that he has never smoked. He has never used smokeless tobacco. He reports that he does not drink alcohol or use drugs.   Family History:  The patient's family history  includes Cancer in his brother; Heart attack in his father; Hypertension in his sister and sister.    ROS:  Please see the history of present illness.   Otherwise, review of systems is positive for joint swelling.   All other systems are reviewed and negative.    PHYSICAL EXAM: VS:  There were no vitals taken for this visit. , BMI There is no height or weight on file to calculate BMI. GEN: Well nourished, well developed, in no acute distress  HEENT: normal  Neck: no JVD, carotid bruits, or masses Cardiac: RRR; no murmurs, rubs, or gallops,no edema  Respiratory:  clear to auscultation bilaterally, normal work of breathing GI: soft, nontender, nondistended, + BS MS: no deformity or atrophy Skin: warm and dry Neuro:  Strength and sensation are intact Psych: euthymic mood, full affect  EKG:  EKG is ordered today. Personal review of the ekg ordered shows sinus rhythm, 1 degree AV block, LBBB, rate 63  Recent Labs: 09/18/2016: ALT 85; Magnesium 2.1 09/21/2016: Hemoglobin 10.9 10/05/2016: BUN 21; Creatinine, Ser 1.36; Platelets 152; Potassium 5.2; Sodium 140    Lipid Panel     Component Value Date/Time   CHOL 159 09/07/2016 0844   TRIG 146 09/07/2016 0844   HDL 25 (L) 09/07/2016 0844   CHOLHDL 6.4 09/07/2016 0844   VLDL 29 09/07/2016 0844   LDLCALC 105 (H) 09/07/2016 0844     Wt Readings from Last 3 Encounters:  10/10/16 257 lb (116.6 kg)  10/05/16 257 lb 9.6 oz (116.8 kg)  09/21/16 244 lb 7.8 oz (110.9 kg)      Other studies Reviewed: Additional studies/ records that were reviewed today include: TTE 08/28/16, Cath 09/07/16  Review of the above records today demonstrates:  - Left ventricle: The cavity size was mildly dilated. Wall   thickness was increased in a pattern of severe LVH. Systolic   function was moderately reduced. The estimated ejection fraction   was in the range of 35% to 40%. Akinesis of the basalinferior and   apical myocardium. Features are consistent  with a pseudonormal   left ventricular filling pattern, with concomitant abnormal   relaxation and increased filling pressure (grade 2 diastolic   dysfunction). - Aortic valve: There was mild regurgitation. - Mitral valve: There was mild regurgitation. - Left atrium: The atrium was severely dilated.   There is moderate left ventricular systolic dysfunction.  LV end diastolic pressure is normal.  The left ventricular ejection fraction is 35-45% by visual estimate.  Ost RCA lesion, 90 %stenosed.  Mid RCA to Dist RCA lesion, 100 %stenosed.   1. Single vessel occlusive CAD. The distal RCA is occluded with left to right collaterals. There is a 90% ostial stenosis 2. Moderate LV dysfunction with EF 35-40%. Akinesis of the basal to mid inferior wall 3. Normal LVEDP   ASSESSMENT AND PLAN:  1.  VT/VF arrest: Had out of hospital arrest with good recovery in mental status.  ICD implanted 10/10/16.  2. Ischemic cardiomyopathy: Has disease in the RCA. On optimal medical therapy with coreg and losartan. ICD in place for secondary prevention. Device functioning appropriately. No changes made today.  3. CAD: continue aspirin  4. Hyperlipidemia: continue statin  Current medicines are reviewed at length with the patient today.   The patient does not have concerns regarding his medicines.  The following changes were made today:    Labs/ tests ordered today include:  No orders of the defined types were placed in this encounter.    Disposition:   FU with Tmya Wigington 6 months  Signed, Carrisa Keller Meredith Leeds, MD  01/04/2017 8:07 AM     [facility] [address] [place] [address] [phone] (office) [phone] (fax)

## 2017-01-05 LAB — CUP PACEART INCLINIC DEVICE CHECK
Battery Remaining Longevity: 137 mo
Battery Voltage: 3.15 V
Brady Statistic RV Percent Paced: 0.02 %
Date Time Interrogation Session: 20180412135935
HighPow Impedance: 59 Ohm
Implantable Lead Implant Date: 20180116
Implantable Lead Location: 753860
Implantable Lead Model: 6935
Implantable Pulse Generator Implant Date: 20180116
Lead Channel Impedance Value: 304 Ohm
Lead Channel Impedance Value: 380 Ohm
Lead Channel Pacing Threshold Amplitude: 0.625 V
Lead Channel Pacing Threshold Pulse Width: 0.4 ms
Lead Channel Sensing Intrinsic Amplitude: 14.875 mV
Lead Channel Setting Pacing Amplitude: 2.5 V
Lead Channel Setting Pacing Pulse Width: 0.4 ms
Lead Channel Setting Sensing Sensitivity: 0.3 mV

## 2017-01-31 ENCOUNTER — Encounter: Payer: Medicare HMO | Attending: Physical Medicine & Rehabilitation | Admitting: Physical Medicine & Rehabilitation

## 2017-04-05 ENCOUNTER — Ambulatory Visit (INDEPENDENT_AMBULATORY_CARE_PROVIDER_SITE_OTHER): Payer: Medicare HMO | Admitting: *Deleted

## 2017-04-05 ENCOUNTER — Telehealth: Payer: Self-pay | Admitting: Cardiology

## 2017-04-05 DIAGNOSIS — I255 Ischemic cardiomyopathy: Secondary | ICD-10-CM

## 2017-04-05 NOTE — Telephone Encounter (Signed)
Spoke w/ pt and informed him that his remote transmission was received. Informed him that device clinic nurse will review and send to MD, if there is any abnormalities he will receive a call otherwise no news is good news.

## 2017-04-05 NOTE — Progress Notes (Signed)
Remote ICD transmission.   

## 2017-04-05 NOTE — Telephone Encounter (Signed)
New message    1. Has your device fired? no   2. Is you device beeping? no  3. Are you experiencing draining or swelling at device site? no  4. Are you calling to see if we received your device transmission? Yes, and some additional questions  5. Have you passed out? no

## 2017-04-10 ENCOUNTER — Encounter: Payer: Self-pay | Admitting: Cardiology

## 2017-05-02 LAB — CUP PACEART REMOTE DEVICE CHECK
Battery Remaining Longevity: 135 mo
Battery Voltage: 3.12 V
Brady Statistic RV Percent Paced: 0.02 %
Date Time Interrogation Session: 20180712113725
HighPow Impedance: 72 Ohm
Implantable Lead Implant Date: 20180116
Implantable Lead Location: 753860
Implantable Lead Model: 6935
Implantable Pulse Generator Implant Date: 20180116
Lead Channel Impedance Value: 266 Ohm
Lead Channel Impedance Value: 380 Ohm
Lead Channel Pacing Threshold Amplitude: 0.625 V
Lead Channel Pacing Threshold Pulse Width: 0.4 ms
Lead Channel Sensing Intrinsic Amplitude: 10.75 mV
Lead Channel Sensing Intrinsic Amplitude: 10.75 mV
Lead Channel Setting Pacing Amplitude: 2.5 V
Lead Channel Setting Pacing Pulse Width: 0.4 ms
Lead Channel Setting Sensing Sensitivity: 0.3 mV

## 2017-05-24 ENCOUNTER — Other Ambulatory Visit: Payer: Self-pay | Admitting: Student

## 2017-07-04 ENCOUNTER — Encounter: Payer: Self-pay | Admitting: Cardiology

## 2017-07-04 ENCOUNTER — Ambulatory Visit (INDEPENDENT_AMBULATORY_CARE_PROVIDER_SITE_OTHER): Payer: Medicare HMO | Admitting: Cardiology

## 2017-07-04 VITALS — BP 156/78 | HR 80 | Ht 66.0 in | Wt 277.4 lb

## 2017-07-04 DIAGNOSIS — Z95810 Presence of automatic (implantable) cardiac defibrillator: Secondary | ICD-10-CM | POA: Diagnosis not present

## 2017-07-04 DIAGNOSIS — I472 Ventricular tachycardia, unspecified: Secondary | ICD-10-CM

## 2017-07-04 DIAGNOSIS — Z79899 Other long term (current) drug therapy: Secondary | ICD-10-CM | POA: Diagnosis not present

## 2017-07-04 DIAGNOSIS — I255 Ischemic cardiomyopathy: Secondary | ICD-10-CM | POA: Diagnosis not present

## 2017-07-04 DIAGNOSIS — I10 Essential (primary) hypertension: Secondary | ICD-10-CM | POA: Diagnosis not present

## 2017-07-04 DIAGNOSIS — E785 Hyperlipidemia, unspecified: Secondary | ICD-10-CM | POA: Diagnosis not present

## 2017-07-04 DIAGNOSIS — I2510 Atherosclerotic heart disease of native coronary artery without angina pectoris: Secondary | ICD-10-CM

## 2017-07-04 LAB — CUP PACEART INCLINIC DEVICE CHECK
Battery Remaining Longevity: 134 mo
Battery Voltage: 3.08 V
Brady Statistic RV Percent Paced: 0.02 %
Date Time Interrogation Session: 20181010150627
HighPow Impedance: 68 Ohm
Implantable Lead Implant Date: 20180116
Implantable Lead Location: 753860
Implantable Lead Model: 6935
Implantable Pulse Generator Implant Date: 20180116
Lead Channel Impedance Value: 266 Ohm
Lead Channel Impedance Value: 342 Ohm
Lead Channel Pacing Threshold Amplitude: 0.75 V
Lead Channel Pacing Threshold Pulse Width: 0.4 ms
Lead Channel Sensing Intrinsic Amplitude: 11.25 mV
Lead Channel Setting Pacing Amplitude: 2.5 V
Lead Channel Setting Pacing Pulse Width: 0.4 ms
Lead Channel Setting Sensing Sensitivity: 0.3 mV

## 2017-07-04 LAB — COMPREHENSIVE METABOLIC PANEL
ALT: 17 IU/L (ref 0–44)
AST: 20 IU/L (ref 0–40)
Albumin/Globulin Ratio: 1.5 (ref 1.2–2.2)
Albumin: 4.5 g/dL (ref 3.6–4.8)
Alkaline Phosphatase: 87 IU/L (ref 39–117)
BUN/Creatinine Ratio: 11 (ref 10–24)
BUN: 14 mg/dL (ref 8–27)
Bilirubin Total: 0.9 mg/dL (ref 0.0–1.2)
CO2: 21 mmol/L (ref 20–29)
Calcium: 10.3 mg/dL — ABNORMAL HIGH (ref 8.6–10.2)
Chloride: 102 mmol/L (ref 96–106)
Creatinine, Ser: 1.22 mg/dL (ref 0.76–1.27)
GFR calc Af Amer: 70 mL/min/{1.73_m2} (ref >59)
GFR calc non Af Amer: 61 mL/min/{1.73_m2} (ref >59)
Globulin, Total: 3.1 g/dL (ref 1.5–4.5)
Glucose: 104 mg/dL — ABNORMAL HIGH (ref 65–99)
Potassium: 4.3 mmol/L (ref 3.5–5.2)
Sodium: 140 mmol/L (ref 134–144)
Total Protein: 7.6 g/dL (ref 6.0–8.5)

## 2017-07-04 LAB — TSH: TSH: 2.38 u[IU]/mL (ref 0.450–4.500)

## 2017-07-04 MED ORDER — LOSARTAN POTASSIUM 100 MG PO TABS: 100.0000 mg | ORAL_TABLET | Freq: Every day | ORAL | 3 refills | Status: DC

## 2017-07-04 NOTE — Patient Instructions (Addendum)
Medication Instructions:  Your physician has recommended you make the following change in your medication:  1. INCREASE LOSARTAN to 100 mg daily  Labwork: Today: CMET & TSH  Testing/Procedures: None ordered  Follow-Up: Remote monitoring is used to monitor your Pacemaker of ICD from home. This monitoring reduces the number of office visits required to check your device to one time per year. It allows [place] to keep an eye on the functioning of your device to ensure it is working properly. You are scheduled for a device check from home on 10/03/2017. You may send your transmission at any time that day. If you have a wireless device, the transmission will be sent automatically. After your physician reviews your transmission, you will receive a postcard with your next transmission date.  Your physician wants you to follow-up in: 6 months with Dr. Curt Bears.  You will receive a reminder letter in the mail two months in advance. If you don't receive a letter, please call our office to schedule the follow-up appointment.  --- If you need a refill on your cardiac medications before your next appointment, please call your pharmacy. ---  Thank you for choosing [facility]!!   Trinidad Curet, RN [phone]

## 2017-07-04 NOTE — Progress Notes (Signed)
Electrophysiology Office Note   Date:  07/04/2017   ID:  Dean [name], DOB 1951/04/27, MRN [id number]  PCP:  [name], MD Primary Electrophysiologist:  Dayonna Selbe Meredith Leeds, MD    Chief Complaint  Patient presents with  . Defib Check    Ischemic cardiomyopathy/VT     History of Present Illness: Dean [name] is a 66 y.o. male who presents today for electrophysiology evaluation.   Hx asthma, T2DM, asthma, gout, HTN who was admitted on 08/27/2016 after he collapsed walking back from the bathroom. CPR not performed by family. He was treated with CPR and ALS protocol and he coded again en route to the hospital.  He had episode of NSVT in CT scanner and treated with amiodarone. EKG with inferior T wave inversions and was treated with [employer] protocol and elevated troponin felt to be due to shock applied for VT/VF. Cardiac cath 12/14 revealed 90% ostial stenosis distal R-RCA with left to right collaterals and 100% stenosis mid RCA to distal RCA lesion. ICD implanted 10/10/16.   Today, denies symptoms of palpitations, chest pain, shortness of breath, orthopnea, PND, lower extremity edema, claudication, dizziness, presyncope, syncope, bleeding, or neurologic sequela. The patient is tolerating medications without difficulties. Few weeks ago, he was having orthopnea and PND type symptoms. He went to his primary physician who increased his dose of Lasix. Since that time, he has done well without further symptoms of PND or orthopnea. Unfortunately, in May his wife was in a car accident and died. He is continuing to deal with that issue. He does say that he is coping better now than he was a few weeks ago. He is going back to work as well.    Past Medical History:  Diagnosis Date  . 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 Arthritis    "maybe in his spine" (09/05/2016)  . Asthma   . Cardiac arrest ([place]) 08/27/2016   REQUIRING CPR, SHOCK & MEDICATIONS  . Gout     . High cholesterol   . Hypertension   . OSA on CPAP   . Type II diabetes mellitus ([place])    Past Surgical History:  Procedure Laterality Date  . CARDIAC CATHETERIZATION N/A 09/07/2016   Procedure: Left Heart Cath and Coronary Angiography;  Surgeon: Peter M Martinique, MD;  Location: [facility];  Service: Cardiovascular;  Laterality: N/A;  . EP IMPLANTABLE DEVICE N/A 10/10/2016   Procedure: ICD Implant;  Surgeon: Tama Grosz Meredith Leeds, MD;  Location: [facility];  Service: Cardiovascular;  Laterality: N/A;     Current Outpatient Prescriptions  Medication Sig Dispense Refill  . allopurinol (ZYLOPRIM) 300 MG tablet Take 300 mg by mouth daily.    [name] amiodarone (PACERONE) 200 MG tablet Take 1 tablet (200 mg total) by mouth daily. 30 tablet 5  . aspirin EC 81 MG EC tablet Take 1 tablet (81 mg total) by mouth daily.    [name] atorvastatin (LIPITOR) 40 MG tablet Take 1 tablet (40 mg total) by mouth daily at 6 PM. 30 tablet 5  . carvedilol (COREG) 25 MG tablet TAKE 1 TABLET (25 MG TOTAL) BY MOUTH 2 (TWO) TIMES DAILY WITH A MEAL. 60 tablet 7  . CVS VITAMIN D3 1000 units capsule Take 1,000 Units by mouth daily.  5  . furosemide (LASIX) 40 MG tablet Take 80 mg by mouth daily.   3  . magnesium oxide (MAG-OX) 400 (241.3 Mg) MG tablet Take 0.5 tablets (200 mg total) by mouth 2 (two)  times daily. 60 tablet 0  . metFORMIN (GLUCOPHAGE) 500 MG tablet Take 1 tablet (500 mg total) by mouth daily with breakfast. 30 tablet 0  . Multiple Vitamin (MULTIVITAMIN WITH MINERALS) TABS tablet Take 1 tablet by mouth daily.    . potassium chloride SA (K-DUR,KLOR-CON) 20 MEQ tablet Take 1 tablet (20 mEq total) by mouth daily. 30 tablet 5  . losartan (COZAAR) 100 MG tablet Take 1 tablet (100 mg total) by mouth daily. 90 tablet 3   No current facility-administered medications for this visit.     Allergies:   Contrast media [iodinated diagnostic agents] and Lisinopril   Social History:  The patient  reports that he  has never smoked. He has never used smokeless tobacco. He reports that he does not drink alcohol or use drugs.   Family History:  The patient's family history includes Cancer in his brother; Heart attack in his father; Hypertension in his sister and sister.    ROS:  Please see the history of present illness.   Otherwise, review of systems is positive for SOB, wheezing.   All other systems are reviewed and negative.   PHYSICAL EXAM: VS:  BP (!) 156/78   Pulse 80   Ht '5\' 6"'  (1.676 m)   Wt 277 lb 6.4 oz (125.8 kg)   BMI 44.77 kg/m  , BMI Body mass index is 44.77 kg/m. GEN: Well nourished, well developed, in no acute distress  HEENT: normal  Neck: no JVD, carotid bruits, or masses Cardiac: RRR; no murmurs, rubs, or gallops,no edema  Respiratory:  clear to auscultation bilaterally, normal work of breathing GI: soft, nontender, nondistended, + BS MS: no deformity or atrophy  Skin: warm and dry, device site well healed Neuro:  Strength and sensation are intact Psych: euthymic mood, full affect  EKG:  EKG is ordered today. Personal review of the ekg ordered shows sinus rhythm, 1 degree AV block, LBBB, rate 80  Personal review of the device interrogation today. Results in [facility]: 09/18/2016: ALT 85; Magnesium 2.1 10/05/2016: BUN 21; Creatinine, Ser 1.36; Hemoglobin 11.5; Platelets 152; Potassium 5.2; Sodium 140    Lipid Panel     Component Value Date/Time   CHOL 159 09/07/2016 0844   TRIG 146 09/07/2016 0844   HDL 25 (L) 09/07/2016 0844   CHOLHDL 6.4 09/07/2016 0844   VLDL 29 09/07/2016 0844   LDLCALC 105 (H) 09/07/2016 0844     Wt Readings from Last 3 Encounters:  07/04/17 277 lb 6.4 oz (125.8 kg)  01/04/17 268 lb 6.4 oz (121.7 kg)  10/10/16 257 lb (116.6 kg)      Other studies Reviewed: Additional studies/ records that were reviewed today include: TTE 08/28/16, Cath 09/07/16  Review of the above records today demonstrates:  - Left ventricle: The cavity  size was mildly dilated. Wall   thickness was increased in a pattern of severe LVH. Systolic   function was moderately reduced. The estimated ejection fraction   was in the range of 35% to 40%. Akinesis of the basalinferior and   apical myocardium. Features are consistent with a pseudonormal   left ventricular filling pattern, with concomitant abnormal   relaxation and increased filling pressure (grade 2 diastolic   dysfunction). - Aortic valve: There was mild regurgitation. - Mitral valve: There was mild regurgitation. - Left atrium: The atrium was severely dilated.   There is moderate left ventricular systolic dysfunction.  LV end diastolic pressure is normal.  The left ventricular ejection fraction  is 35-45% by visual estimate.  Ost RCA lesion, 90 %stenosed.  Mid RCA to Dist RCA lesion, 100 %stenosed.   1. Single vessel occlusive CAD. The distal RCA is occluded with left to right collaterals. There is a 90% ostial stenosis 2. Moderate LV dysfunction with EF 35-40%. Akinesis of the basal to mid inferior wall 3. Normal LVEDP   ASSESSMENT AND PLAN:  1.  VT/VF arrest: Out-of-hospital arrest with improvement in his mental status. ICD implanted 10/10/16. Device functioning appropriately. No changes at this time.   2. Ischemic cardiomyopathy: Has disease in his RCA. On optimal medical therapy with Coreg and losartan. ICD implant for secondary prevention. His blood pressure is elevated today and thus Kamariyah Timberlake increase his losartan. I have also discussed daily weights and adjusting Lasix dose if he becomes more short of breath.   3. CAD: Continue aspirin  4. Hyperlipidemia: Continue statin  5. Hypertension: On Coreg and Ibrohim Simmers increase losartan today.  Current medicines are reviewed at length with the patient today.   The patient does not have concerns regarding his medicines.  The following changes were made today:  Increase losartan  Labs/ tests ordered today include:  Orders Placed  This Encounter  Procedures  . Comp Met (CMET)  . TSH  . EKG 12-Lead     Disposition:   FU with Malea Swilling 6 months  Signed, Teresia Myint Meredith Leeds, MD  07/04/2017 11:23 AM     [facility] [address] [place] [address] [phone] (office) [phone] (fax)

## 2017-09-01 ENCOUNTER — Other Ambulatory Visit: Payer: Self-pay | Admitting: Cardiology

## 2017-10-03 ENCOUNTER — Ambulatory Visit (INDEPENDENT_AMBULATORY_CARE_PROVIDER_SITE_OTHER): Payer: Medicare HMO | Admitting: *Deleted

## 2017-10-03 DIAGNOSIS — I255 Ischemic cardiomyopathy: Secondary | ICD-10-CM | POA: Diagnosis not present

## 2017-10-03 NOTE — Progress Notes (Signed)
Remote ICD transmission.   

## 2017-10-04 ENCOUNTER — Encounter: Payer: Self-pay | Admitting: Cardiology

## 2017-10-04 LAB — CUP PACEART REMOTE DEVICE CHECK
Battery Remaining Longevity: 133 mo
Battery Voltage: 3.06 V
Brady Statistic RV Percent Paced: 0.01 %
Date Time Interrogation Session: 20190109092306
HighPow Impedance: 72 Ohm
Implantable Lead Implant Date: 20180116
Implantable Lead Location: 753860
Implantable Lead Model: 6935
Implantable Pulse Generator Implant Date: 20180116
Lead Channel Impedance Value: 247 Ohm
Lead Channel Impedance Value: 342 Ohm
Lead Channel Pacing Threshold Amplitude: 0.75 V
Lead Channel Pacing Threshold Pulse Width: 0.4 ms
Lead Channel Sensing Intrinsic Amplitude: 12.375 mV
Lead Channel Sensing Intrinsic Amplitude: 12.375 mV
Lead Channel Setting Pacing Amplitude: 2.5 V
Lead Channel Setting Pacing Pulse Width: 0.4 ms
Lead Channel Setting Sensing Sensitivity: 0.3 mV

## 2017-10-05 ENCOUNTER — Encounter (HOSPITAL_COMMUNITY): Payer: Self-pay | Admitting: Emergency Medicine

## 2017-10-05 ENCOUNTER — Other Ambulatory Visit: Payer: Self-pay

## 2017-10-05 DIAGNOSIS — E119 Type 2 diabetes mellitus without complications: Secondary | ICD-10-CM | POA: Diagnosis not present

## 2017-10-05 DIAGNOSIS — T82118A Breakdown (mechanical) of other cardiac electronic device, initial encounter: Secondary | ICD-10-CM | POA: Diagnosis present

## 2017-10-05 DIAGNOSIS — Z79899 Other long term (current) drug therapy: Secondary | ICD-10-CM | POA: Insufficient documentation

## 2017-10-05 DIAGNOSIS — Z7984 Long term (current) use of oral hypoglycemic drugs: Secondary | ICD-10-CM | POA: Diagnosis not present

## 2017-10-05 DIAGNOSIS — J45909 Unspecified asthma, uncomplicated: Secondary | ICD-10-CM | POA: Insufficient documentation

## 2017-10-05 DIAGNOSIS — Z4502 Encounter for adjustment and management of automatic implantable cardiac defibrillator: Secondary | ICD-10-CM | POA: Insufficient documentation

## 2017-10-05 DIAGNOSIS — Y828 Other medical devices associated with adverse incidents: Secondary | ICD-10-CM | POA: Diagnosis not present

## 2017-10-05 DIAGNOSIS — Z7982 Long term (current) use of aspirin: Secondary | ICD-10-CM | POA: Diagnosis not present

## 2017-10-05 DIAGNOSIS — I10 Essential (primary) hypertension: Secondary | ICD-10-CM | POA: Diagnosis not present

## 2017-10-05 LAB — CBC WITH DIFFERENTIAL/PLATELET
Basophils Absolute: 0 10*3/uL (ref 0.0–0.1)
Basophils Relative: 0 %
Eosinophils Absolute: 0.4 10*3/uL (ref 0.0–0.7)
Eosinophils Relative: 4 %
HCT: 38.3 % — ABNORMAL LOW (ref 39.0–52.0)
Hemoglobin: 12.3 g/dL — ABNORMAL LOW (ref 13.0–17.0)
Lymphocytes Relative: 20 %
Lymphs Abs: 2 10*3/uL (ref 0.7–4.0)
MCH: 26.5 pg (ref 26.0–34.0)
MCHC: 32.1 g/dL (ref 30.0–36.0)
MCV: 82.5 fL (ref 78.0–100.0)
Monocytes Absolute: 0.8 10*3/uL (ref 0.1–1.0)
Monocytes Relative: 8 %
Neutro Abs: 6.8 10*3/uL (ref 1.7–7.7)
Neutrophils Relative %: 68 %
Platelets: 237 10*3/uL (ref 150–400)
RBC: 4.64 MIL/uL (ref 4.22–5.81)
RDW: 14.5 % (ref 11.5–15.5)
WBC: 10.1 10*3/uL (ref 4.0–10.5)

## 2017-10-05 LAB — COMPREHENSIVE METABOLIC PANEL
ALT: 21 U/L (ref 17–63)
AST: 22 U/L (ref 15–41)
Albumin: 3.9 g/dL (ref 3.5–5.0)
Alkaline Phosphatase: 69 U/L (ref 38–126)
Anion gap: 8 (ref 5–15)
BUN: 20 mg/dL (ref 6–20)
CO2: 26 mmol/L (ref 22–32)
Calcium: 9.9 mg/dL (ref 8.9–10.3)
Chloride: 103 mmol/L (ref 101–111)
Creatinine, Ser: 1.35 mg/dL — ABNORMAL HIGH (ref 0.61–1.24)
GFR calc Af Amer: >60 mL/min (ref >60)
GFR calc non Af Amer: 53 mL/min — ABNORMAL LOW (ref >60)
Glucose, Bld: 115 mg/dL — ABNORMAL HIGH (ref 65–99)
Potassium: 3.9 mmol/L (ref 3.5–5.1)
Sodium: 137 mmol/L (ref 135–145)
Total Bilirubin: 0.9 mg/dL (ref 0.3–1.2)
Total Protein: 7.7 g/dL (ref 6.5–8.1)

## 2017-10-05 LAB — I-STAT TROPONIN, ED: Troponin i, poc: 0.06 ng/mL (ref 0.00–0.08)

## 2017-10-05 NOTE — ED Triage Notes (Signed)
Pt to ED with c/o feeling a vibration in his chest from his defibrillator.  Pt denies any chest pain or any other complaints

## 2017-10-06 ENCOUNTER — Emergency Department (HOSPITAL_COMMUNITY): Payer: Medicare HMO

## 2017-10-06 ENCOUNTER — Emergency Department (HOSPITAL_COMMUNITY)
Admission: EM | Admit: 2017-10-06 | Discharge: 2017-10-06 | Disposition: A | Discharge status: Home / Self Care | Payer: Medicare HMO | Attending: Emergency Medicine | Admitting: Emergency Medicine

## 2017-10-06 DIAGNOSIS — Z4502 Encounter for adjustment and management of automatic implantable cardiac defibrillator: Secondary | ICD-10-CM

## 2017-10-06 NOTE — ED Provider Notes (Signed)
TIME SEEN: 4:30 AM  CHIEF COMPLAINT: "I felt my defibrillator vibrating"  HPI: Patient is a 67 year old male with history of diabetes, hypertension, hyperlipidemia, previous history of cardiac arrest in December 2017, CHF with pacemaker/AICD placed in January 2018 who presents emergency department feeling like his defibrillator was a vibrating today.  Has felt intermittently.  There have not been any alarms that he has not felt any shocks.  He states he has been feeling well.  States symptoms started yesterday evening when he was walking.  He denies chest pain or chest discomfort, shortness of breath, dizziness, palpitations, nausea or vomiting, diaphoresis, lower extremity swelling or pain.  States he has been feeling great.  ROS: See HPI Constitutional: no fever  Eyes: no drainage  ENT: no runny nose   Cardiovascular:  no chest pain  Resp: no SOB  GI: no vomiting GU: no dysuria Integumentary: no rash  Allergy: no hives  Musculoskeletal: no leg swelling  Neurological: no slurred speech ROS otherwise negative  PAST MEDICAL HISTORY/PAST SURGICAL HISTORY:  Past Medical History:  Diagnosis Date  . 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 Arthritis    "maybe in his spine" (09/05/2016)  . Asthma   . Cardiac arrest ([place]) 08/27/2016   REQUIRING CPR, SHOCK & MEDICATIONS  . Gout   . High cholesterol   . Hypertension   . OSA on CPAP   . Type II diabetes mellitus ([facility])     MEDICATIONS:  Prior to Admission medications   Medication Sig Start Date End Date Taking? Authorizing Provider  allopurinol (ZYLOPRIM) 300 MG tablet Take 300 mg by mouth daily. 08/16/16   [provider]  amiodarone (PACERONE) 200 MG tablet TAKE 1 TABLET BY MOUTH EVERY DAY 09/04/17   Camnitz, Ocie Doyne, MD  aspirin EC 81 MG EC tablet Take 1 tablet (81 mg total) by mouth daily. 09/16/16   Elgergawy, Silver Huguenin, MD  atorvastatin (LIPITOR) 40 MG tablet Take 1 tablet (40 mg total) by  mouth daily at 6 PM. 10/27/16   Camnitz, Ocie Doyne, MD  carvedilol (COREG) 25 MG tablet TAKE 1 TABLET (25 MG TOTAL) BY MOUTH 2 (TWO) TIMES DAILY WITH A MEAL. 05/24/17   Strader, Fransisco Hertz, PA-C  CVS VITAMIN D3 1000 units capsule Take 1,000 Units by mouth daily. 08/16/16   [provider]  furosemide (LASIX) 40 MG tablet Take 80 mg by mouth daily.  06/07/17   [provider]  KLOR-CON M20 20 MEQ tablet TAKE 1 TABLET BY MOUTH EVERY DAY 09/04/17   Camnitz, Ocie Doyne, MD  losartan (COZAAR) 100 MG tablet Take 1 tablet (100 mg total) by mouth daily. 07/04/17 10/02/17  Camnitz, Ocie Doyne, MD  magnesium oxide (MAG-OX) 400 (241.3 Mg) MG tablet Take 0.5 tablets (200 mg total) by mouth 2 (two) times daily. 09/21/16   Angiulli, Lavon Paganini, PA-C  metFORMIN (GLUCOPHAGE) 500 MG tablet Take 1 tablet (500 mg total) by mouth daily with breakfast. 09/21/16   Angiulli, Lavon Paganini, PA-C  Multiple Vitamin (MULTIVITAMIN WITH MINERALS) TABS tablet Take 1 tablet by mouth daily. 09/16/16   Elgergawy, Silver Huguenin, MD    ALLERGIES:  Allergies  Allergen Reactions  . Contrast Media [Iodinated Diagnostic Agents] Shortness Of Breath and Nausea And Vomiting  . Lisinopril     cough    SOCIAL HISTORY:  Social History   Tobacco Use  . Smoking status: Never Smoker  . Smokeless tobacco: Never Used  Substance Use Topics  . Alcohol use: No  FAMILY HISTORY: Family History  Problem Relation Age of Onset  . Heart attack Father        died in his 72's  . Hypertension Sister   . Cancer Brother        uncertain, "leg"  . Hypertension Sister     EXAM: BP 136/60   Pulse 71   Temp 97.8 F (36.6 C) (Oral)   Resp (!) 21   Ht 5\' 6"  (1.676 m)   Wt 124.7 kg (275 lb)   SpO2 97%   BMI 44.39 kg/m  CONSTITUTIONAL: Alert and oriented and responds appropriately to questions. Well-appearing; well-nourished HEAD: Normocephalic EYES: Conjunctivae clear, pupils appear equal, EOMI ENT: normal nose; moist mucous  membranes NECK: Supple, no meningismus, no nuchal rigidity, no LAD  CARD: RRR; S1 and S2 appreciated; no murmurs, no clicks, no rubs, no gallops CHEST:  Chest wall is nontender to palpation.  No crepitus, ecchymosis, erythema, warmth, rash or other lesions present.  Patient has a pacemaker/AICD in the left chest wall with no overlying erythema, warmth, tenderness, fluctuance or induration. RESP: Normal chest excursion without splinting or tachypnea; breath sounds clear and equal bilaterally; no wheezes, no rhonchi, no rales, no hypoxia or respiratory distress, speaking full sentences ABD/GI: Normal bowel sounds; non-distended; soft, non-tender, no rebound, no guarding, no peritoneal signs, no hepatosplenomegaly BACK:  The back appears normal and is non-tender to palpation, there is no CVA tenderness EXT: Normal ROM in all joints; non-tender to palpation; no edema; normal capillary refill; no cyanosis, no calf tenderness or swelling    SKIN: Normal color for age and race; warm; no rash NEURO: Moves all extremities equally PSYCH: The patient's mood and manner are appropriate. Grooming and personal hygiene are appropriate.  MEDICAL DECISION MAKING: Patient here after he has felt his defibrillator vibrating.  No shocks or alarms.  Will interrogate the defibrillator.  He has a Medtronic that was placed in January 2018.  No other symptoms.  Labs, chest x-ray unremarkable. EKG shows no ischemic abnormality or arrhythmia.   ED PROGRESS: Discussed with [employer] representative.  It appears the defibrillator/pacemaker is working properly.  There have been no alerts or alarms.  No arrhythmias or cardiac events noted.  Unclear why patient felt vibration over his defibrillator site but he is well-appearing now with normal interrogation, normal labs, normal symptoms.  I feel he is safe to go home and follow-up with his cardiologist as needed.  We did discuss return precautions.   At this time, I do not feel  there is any life-threatening condition present. I have reviewed and discussed all results (EKG, imaging, lab, urine as appropriate) and exam findings with patient/family. I have reviewed nursing notes and appropriate previous records.  I feel the patient is safe to be discharged home without further emergent workup and can continue workup as an outpatient as needed. Discussed usual and customary return precautions. Patient/family verbalize understanding and are comfortable with this plan.  Outpatient follow-up has been provided if needed. All questions have been answered.       EKG Interpretation  Date/Time:  Friday October 05 2017 21:30:26 EST Ventricular Rate:  73 PR Interval:  220 QRS Duration: 146 QT Interval:  458 QTC Calculation: 504 R Axis:   137 Text Interpretation:  Sinus rhythm with 1st degree A-V block Right axis deviation Left bundle branch block Abnormal ECG No significant change since last tracing Confirmed by Janell Keeling, Cyril Mourning [phone]) on 10/06/2017 4:31:15 AM  Evva Din, Delice Bison, DO 10/06/17 [phone]

## 2017-10-06 NOTE — ED Notes (Signed)
Patient left at this time with all belongings. 

## 2017-10-08 ENCOUNTER — Telehealth: Payer: Self-pay | Admitting: Cardiology

## 2017-10-08 NOTE — Telephone Encounter (Signed)
Spoke with pt, pt stated that he went to the ED on Saturday and had been worked up, pt stated that he felt something vibrate informed pt that he had a Medtronic ICD and that if anything was wrong his device would alarm. Reinforced what the ED explained to pt that his ICD was working normally.

## 2017-10-08 NOTE — Telephone Encounter (Signed)
New Message:    Please call,had a little episode with his Defibrillator on Friday.

## 2017-10-10 ENCOUNTER — Ambulatory Visit (INDEPENDENT_AMBULATORY_CARE_PROVIDER_SITE_OTHER): Payer: Medicare HMO

## 2017-10-10 ENCOUNTER — Encounter (INDEPENDENT_AMBULATORY_CARE_PROVIDER_SITE_OTHER): Payer: Self-pay | Admitting: Orthopedic Surgery

## 2017-10-10 ENCOUNTER — Ambulatory Visit (INDEPENDENT_AMBULATORY_CARE_PROVIDER_SITE_OTHER): Payer: Medicare HMO | Admitting: Orthopedic Surgery

## 2017-10-10 DIAGNOSIS — M545 Low back pain: Secondary | ICD-10-CM

## 2017-10-10 DIAGNOSIS — G8929 Other chronic pain: Secondary | ICD-10-CM | POA: Diagnosis not present

## 2017-10-10 DIAGNOSIS — M4807 Spinal stenosis, lumbosacral region: Secondary | ICD-10-CM | POA: Diagnosis not present

## 2017-10-11 ENCOUNTER — Encounter (INDEPENDENT_AMBULATORY_CARE_PROVIDER_SITE_OTHER): Payer: Self-pay | Admitting: Orthopedic Surgery

## 2017-10-11 NOTE — Progress Notes (Signed)
Office Visit Note   Patient: Dean [name]           Date of Birth: 04/21/1951           MRN: [id number] Visit Date: 10/10/2017 Requested by: Dean Panda, MD [id number] [place], [place] [address] PCP: Dean Panda, MD  Subjective: Chief Complaint  Patient presents with  . Lower Back - Pain, Follow-up    HPI: Dean [name] is a 67 year old patient with low back pain and radiating leg pain.  He states he has diminished walking endurance to the point of only about 20-25 yards.  He reports pain mostly with activity.  He describes pain from his hips to the back side of his legs.  Since I have seen him he has had significant personal tragedy with the loss of his wife as well as his child being involved in a motor vehicle accident.  He also has had some type of cardiac problem.  He describes significant pain with standing and walking.  Fairly minimal pain at rest.              ROS: All systems reviewed are negative as they relate to the chief complaint within the history of present illness.  Patient denies  fevers or chills.   Assessment & Plan: Visit Diagnoses:  1. Chronic low back pain, unspecified back pain laterality, with sciatica presence unspecified   2. Spinal stenosis of lumbosacral region     Plan: Impression is bilateral leg pain from likely spinal stenosis.  He is taking aspirin as a blood thinner.  Needs MRI lumbar spine to evaluate for what sounds like fairly significant spinal stenosis which will likely be operative.  I will see him back after that study  Follow-Up Instructions: Return for after MRI.   Orders:  Orders Placed This Encounter  Procedures  . XR Lumbar Spine 2-3 Views  . MR Lumbar Spine w/o contrast   No orders of the defined types were placed in this encounter.     Procedures: No procedures performed   Clinical Data: No additional findings.  Objective: Vital Signs: There were no vitals taken for this visit.  Physical Exam:   Constitutional:  Patient appears well-developed HEENT:  Head: Normocephalic Eyes:EOM are normal Neck: Normal range of motion Cardiovascular: Normal rate Pulmonary/chest: Effort normal Neurologic: Patient is alert Skin: Skin is warm Psychiatric: Patient has normal mood and affect    Ortho Exam: Orthopedic exam demonstrates good ankle dorsi flexion plantar flexion quad hamstring strength with palpable pedal pulses and no paresthesias L1 S1 bilaterally.  Does have a little bit of limited internal rotation in both hips.  Hip flexion abduction quad hamstring strength is intact.  Not much pain with forward or lateral bending and no real trochanteric tenderness is noted.  No paresthesias L1 S1 bilaterally  Specialty Comments:  No specialty comments available.  Imaging: No results found.   PMFS History: Patient Active Problem List   Diagnosis Date Noted  . 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 CHF (congestive heart failure) ([place]) 10/10/2016  . Cardiomyopathy, ischemic   . Debility 10/04/2016  . Stage 3 chronic kidney disease ([place])   . AKI (acute kidney injury) ([place])   . Acute systolic congestive heart failure ([place])   . Disorientated   . Bacteremia   . Anoxic brain injury ([place]) 09/15/2016  . Benign essential HTN   . OSA (obstructive sleep apnea)   . Transaminitis   . Acute lower UTI   . Bacteremia  due to Gram-negative bacteria   . Chronic respiratory failure with hypoxia ([place])   . Fever   . Acute cystitis without hematuria   . NSVT (nonsustained ventricular tachycardia) ([place])   . Uncomplicated asthma   . Diabetes mellitus type 2 in obese ([place])   . Acute on chronic combined systolic and diastolic CHF (congestive heart failure) ([place])   . Tachypnea   . Hypernatremia   . Hypokalemia   . Leukocytosis   . Acute blood loss anemia   . Hypoxemia   . Acute respiratory failure ([place])   . Acute encephalopathy   . Cardiopulmonary arrest ([place]) 08/27/2016  . DM2 (diabetes  mellitus, type 2) ([place]) 03/02/2014  . Morbid obesity ([place]) 03/02/2014  . Essential hypertension, benign 03/02/2014   Past Medical History:  Diagnosis Date  . 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 Arthritis    "maybe in his spine" (09/05/2016)  . Asthma   . Cardiac arrest ([place]) 08/27/2016   REQUIRING CPR, SHOCK & MEDICATIONS  . Gout   . High cholesterol   . Hypertension   . OSA on CPAP   . Type II diabetes mellitus ([facility])     Family History  Problem Relation Age of Onset  . Heart attack Father        died in his 3's  . Hypertension Sister   . Cancer Brother        uncertain, "leg"  . Hypertension Sister     Past Surgical History:  Procedure Laterality Date  . CARDIAC CATHETERIZATION N/A 09/07/2016   Procedure: Left Heart Cath and Coronary Angiography;  Surgeon: Peter M Martinique, MD;  Location: [facility];  Service: Cardiovascular;  Laterality: N/A;  . EP IMPLANTABLE DEVICE N/A 10/10/2016   Procedure: ICD Implant;  Surgeon: Will Meredith Leeds, MD;  Location: [facility];  Service: Cardiovascular;  Laterality: N/A;   Social History   Occupational History  . Not on file  Tobacco Use  . Smoking status: Never Smoker  . Smokeless tobacco: Never Used  Substance and Sexual Activity  . Alcohol use: No  . Drug use: No  . Sexual activity: Not on file

## 2017-11-15 ENCOUNTER — Ambulatory Visit (HOSPITAL_COMMUNITY)
Admission: RE | Admit: 2017-11-15 | Discharge: 2017-11-15 | Disposition: A | Discharge status: Home / Self Care | Payer: Medicare HMO | Source: Ambulatory Visit | Attending: Orthopedic Surgery | Admitting: Orthopedic Surgery

## 2017-11-15 DIAGNOSIS — M5136 Other intervertebral disc degeneration, lumbar region: Secondary | ICD-10-CM | POA: Insufficient documentation

## 2017-11-15 DIAGNOSIS — M4807 Spinal stenosis, lumbosacral region: Secondary | ICD-10-CM | POA: Insufficient documentation

## 2017-11-15 DIAGNOSIS — M48061 Spinal stenosis, lumbar region without neurogenic claudication: Secondary | ICD-10-CM | POA: Insufficient documentation

## 2017-11-23 ENCOUNTER — Encounter (INDEPENDENT_AMBULATORY_CARE_PROVIDER_SITE_OTHER): Payer: Self-pay | Admitting: Orthopedic Surgery

## 2017-11-23 ENCOUNTER — Ambulatory Visit (INDEPENDENT_AMBULATORY_CARE_PROVIDER_SITE_OTHER): Payer: Medicare HMO | Admitting: Orthopedic Surgery

## 2017-11-23 DIAGNOSIS — M48062 Spinal stenosis, lumbar region with neurogenic claudication: Secondary | ICD-10-CM | POA: Diagnosis not present

## 2017-11-23 NOTE — Progress Notes (Signed)
Office Visit Note   Patient: Dean [name]           Date of Birth: January 17, 1951           MRN: [id number] Visit Date: 11/23/2017 Requested by: Dean Panda, MD [address] [place], [place] [address] PCP: Dean Panda, MD  Subjective: Chief Complaint  Patient presents with  . Lower Back - Follow-up    HPI: Dean [name] is a patient with low back pain and leg pain.  Since I have seen him he has had an MRI scan.  He has about 20-25 yards of walking endurance.  He does have a defibrillator in place but is not taking blood thinners.  He states his blood pressure is controlled.  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  It does not appear to have any spondylolisthesis.  I reviewed the scan with the patient.              ROS: All systems reviewed are negative as they relate to the chief complaint within the history of present illness.  Patient denies  fevers or chills.   Assessment & Plan: Visit Diagnoses:  1. Spinal stenosis of lumbar region with neurogenic claudication     Plan: Impression is symptomatic spinal stenosis without spondylolisthesis.  I do not think this is going to be amenable to any shots.  I recommend surgical consultation with 1 of my partners.  Follow-up with me as needed  Follow-Up Instructions: No Follow-up on file.   Orders:  No orders of the defined types were placed in this encounter.  No orders of the defined types were placed in this encounter.     Procedures: No procedures performed   Clinical Data: No additional findings.  Objective: Vital Signs: There were no vitals taken for this visit.  Physical Exam:   Constitutional: Patient appears well-developed HEENT:  Head: Normocephalic Eyes:EOM are normal Neck: Normal range of motion Cardiovascular: Normal rate Pulmonary/chest: Effort normal Neurologic: Patient is alert Skin: Skin is  warm Psychiatric: Patient has normal mood and affect    Ortho Exam: Orthopedic exam demonstrates normal gait and alignment.  Pedal pulses palpable with some trace pitting edema in the lower extremities.  Ankle dorsiflex and plantar flexion strength is symmetric at 5+ out of 5.  No nerve root tension signs.  No definite paresthesias L1-S1 bilaterally when sitting.  The rest of his examination is unchanged.  Specialty Comments:  No specialty comments available.  Imaging: No results found.   PMFS History: Patient Active Problem List   Diagnosis Date Noted  . 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 CHF (congestive heart failure) ([place]) 10/10/2016  . Cardiomyopathy, ischemic   . Debility 10/04/2016  . Stage 3 chronic kidney disease ([place])   . AKI (acute kidney injury) ([place])   . Acute systolic congestive heart failure ([place])   . Disorientated   . Bacteremia   . Anoxic brain injury ([place]) 09/15/2016  . Benign essential HTN   . OSA (obstructive sleep apnea)   . Transaminitis   . Acute lower UTI   . Bacteremia due to Gram-negative bacteria   . Chronic respiratory failure with hypoxia ([place])   . Fever   . Acute cystitis without hematuria   . NSVT (nonsustained ventricular tachycardia) ([place])   . Uncomplicated asthma   . Diabetes mellitus type 2 in obese ([place])   . Acute on chronic  combined systolic and diastolic CHF (congestive heart failure) ([place])   . Tachypnea   . Hypernatremia   . Hypokalemia   . Leukocytosis   . Acute blood loss anemia   . Hypoxemia   . Acute respiratory failure ([place])   . Acute encephalopathy   . Cardiopulmonary arrest ([place]) 08/27/2016  . DM2 (diabetes mellitus, type 2) ([place]) 03/02/2014  . Morbid obesity ([place]) 03/02/2014  . Essential hypertension, benign 03/02/2014   Past Medical History:  Diagnosis Date  . 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 Arthritis    "maybe in his spine" (09/05/2016)  . Asthma    . Cardiac arrest ([place]) 08/27/2016   REQUIRING CPR, SHOCK & MEDICATIONS  . Gout   . High cholesterol   . Hypertension   . OSA on CPAP   . Type II diabetes mellitus ([facility])     Family History  Problem Relation Age of Onset  . Heart attack Father        died in his 17's  . Hypertension Sister   . Cancer Brother        uncertain, "leg"  . Hypertension Sister     Past Surgical History:  Procedure Laterality Date  . CARDIAC CATHETERIZATION N/A 09/07/2016   Procedure: Left Heart Cath and Coronary Angiography;  Surgeon: Dean M Martinique, MD;  Location: [facility];  Service: Cardiovascular;  Laterality: N/A;  . EP IMPLANTABLE DEVICE N/A 10/10/2016   Procedure: ICD Implant;  Surgeon: Dean Meredith Leeds, MD;  Location: [facility];  Service: Cardiovascular;  Laterality: N/A;   Social History   Occupational History  . Not on file  Tobacco Use  . Smoking status: Never Smoker  . Smokeless tobacco: Never Used  Substance and Sexual Activity  . Alcohol use: No  . Drug use: No  . Sexual activity: Not on file

## 2017-12-05 ENCOUNTER — Encounter (INDEPENDENT_AMBULATORY_CARE_PROVIDER_SITE_OTHER): Payer: Self-pay | Admitting: Orthopaedic Surgery

## 2017-12-05 ENCOUNTER — Ambulatory Visit (INDEPENDENT_AMBULATORY_CARE_PROVIDER_SITE_OTHER): Payer: Medicare HMO | Admitting: Orthopaedic Surgery

## 2017-12-05 VITALS — BP 153/71 | HR 70 | Ht 66.0 in | Wt 274.0 lb

## 2017-12-05 DIAGNOSIS — M48062 Spinal stenosis, lumbar region with neurogenic claudication: Secondary | ICD-10-CM | POA: Diagnosis not present

## 2017-12-05 NOTE — Progress Notes (Signed)
Office Visit Note   Patient: Dean [name]           Date of Birth: [birth year]           MRN: [id number] Visit Date: 12/05/2017              Requested by: [name], MD [address] [place], [place] [address] PCP: [name], MD   Assessment & Plan: Visit Diagnoses:  1. Spinal stenosis of lumbar region with neurogenic claudication     Plan: Patient has congenital short pedicles and severe spinal stenosis with neurogenic claudication and can only walk 20 feet due to leg weakness.  He has severe stenosis at L2-3, L3-4, and L4-5.  If surgery is performed he need all 3 levels centrally decompressed.  He has had past history of cardiac problems he did need cardiac clearance.  He is not had cardiac studies since it looks like 2017 and might need a new ultrasound.  He had some evidence of regurgitation and some wall akinesia.  Dr. Curt Bears is his cardiologist.  We will seek preoperative clearance for risk stratification.  Follow-Up Instructions: No Follow-up on file.   Orders:  No orders of the defined types were placed in this encounter.  No orders of the defined types were placed in this encounter.     Procedures: No procedures performed   Clinical Data: No additional findings.   Subjective: Chief Complaint  Patient presents with  . Lower Back - Pain    HPI 67 year old male referred by Dr. Marlou Sa for evaluation of severe spinal stenosis multilevel at L2-3, L3-4 and L4-5.  He is able to walk only 20 feet.  He gets relief with sitting or supine position.  He states he can stand for period of time does better if he leans on something.  He does have cardiac disease had evidence of right coronary artery stenosis and has a implanted defibrillator.  Cardiologist is Dr. Curt Bears at [facility].  Review of Systems patient has past history of cardiac arrest 2017.  Type 2 diabetes on metformin.  Hypertension history of acute encephalopathy.  Implantable defibrillator.  Chronic  combined systolic and diastolic heart failure.  Non-smoker history of acute kidney injury.  Stage III kidney disease.  Positive for obesity.  Positive for neurogenic claudication with L2-3, L3-4 and L4-5 severe spinal stenosis without instability and no anterolisthesis.  Otherwise negative as it pertains HPI.   Objective: Vital Signs: BP (!) 153/71   Pulse 70   Ht 5\' 6"  (1.676 m)   Wt 274 lb (124.3 kg)   BMI 44.22 kg/m   Physical Exam  Constitutional: He is oriented to person, place, and time. He appears well-developed and well-nourished.  HENT:  Head: Normocephalic and atraumatic.  Eyes: EOM are normal. Pupils are equal, round, and reactive to light.  Neck: No tracheal deviation present. No thyromegaly present.  Cardiovascular: Normal rate.  Left chest wall palpable defibrillator.  Pulmonary/Chest: Effort normal. He has no wheezes.  Abdominal: Soft. Bowel sounds are normal.  Neurological: He is alert and oriented to person, place, and time.  Skin: Skin is warm and dry. Capillary refill takes less than 2 seconds.  Psychiatric: He has a normal mood and affect. His behavior is normal. Judgment and thought content normal.    Ortho Exam patient has some limitation of left hip in internal rotation with only 5 degrees with minimal discomfort.  Right hip internal rotation 20 degrees with mild groin pain.  Negative straight leg  raising.  Anterior tib EHL is intact.  He can get on and off the exam table.  Some pain with straight leg raising anterior tib gastrocsoleus quads are intact.  Specialty Comments:  No specialty comments available.  Imaging: No results found.   PMFS History: Patient Active Problem List   Diagnosis Date Noted  . 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 CHF (congestive heart failure) ([place]) 10/10/2016  . Cardiomyopathy, ischemic   . Debility 10/04/2016  . Stage 3 chronic kidney disease ([place])   . AKI (acute kidney injury) ([place])   .  Acute systolic congestive heart failure ([place])   . Disorientated   . Bacteremia   . Anoxic brain injury ([place]) 09/15/2016  . Benign essential HTN   . OSA (obstructive sleep apnea)   . Transaminitis   . Acute lower UTI   . Bacteremia due to Gram-negative bacteria   . Chronic respiratory failure with hypoxia ([place])   . Fever   . Acute cystitis without hematuria   . NSVT (nonsustained ventricular tachycardia) ([place])   . Uncomplicated asthma   . Diabetes mellitus type 2 in obese ([place])   . Acute on chronic combined systolic and diastolic CHF (congestive heart failure) ([place])   . Tachypnea   . Hypernatremia   . Hypokalemia   . Leukocytosis   . Acute blood loss anemia   . Hypoxemia   . Acute respiratory failure ([place])   . Acute encephalopathy   . Cardiopulmonary arrest ([place]) 08/27/2016  . DM2 (diabetes mellitus, type 2) ([place]) 03/02/2014  . Morbid obesity ([place]) 03/02/2014  . Essential hypertension, benign 03/02/2014   Past Medical History:  Diagnosis Date  . 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 Arthritis    "maybe in his spine" (09/05/2016)  . Asthma   . Cardiac arrest ([place]) 08/27/2016   REQUIRING CPR, SHOCK & MEDICATIONS  . Gout   . High cholesterol   . Hypertension   . OSA on CPAP   . Type II diabetes mellitus ([facility])     Family History  Problem Relation Age of Onset  . Heart attack Father        died in his 43's  . Hypertension Sister   . Cancer Brother        uncertain, "leg"  . Hypertension Sister     Past Surgical History:  Procedure Laterality Date  . CARDIAC CATHETERIZATION N/A 09/07/2016   Procedure: Left Heart Cath and Coronary Angiography;  Surgeon: Peter M Martinique, MD;  Location: [facility];  Service: Cardiovascular;  Laterality: N/A;  . EP IMPLANTABLE DEVICE N/A 10/10/2016   Procedure: ICD Implant;  Surgeon: Will Meredith Leeds, MD;  Location: [facility];  Service: Cardiovascular;  Laterality: N/A;   Social History    Occupational History  . Not on file  Tobacco Use  . Smoking status: Never Smoker  . Smokeless tobacco: Never Used  Substance and Sexual Activity  . Alcohol use: No  . Drug use: No  . Sexual activity: Not on file

## 2017-12-07 ENCOUNTER — Telehealth: Payer: Self-pay | Admitting: Cardiology

## 2017-12-07 NOTE — Telephone Encounter (Signed)
Request for surgical clearance:  1. What type of surgery is being performed?  L2-3, L3-4, L4-5 Central Decompression   2. When is this surgery scheduled?  Pending Clearance    3. Are there any medications that need to be held prior to surgery and how long? None   4. Name of physician performing surgery?  Dr. Rodell Perna   5. What is your office phone and fax number?  Office [phone] Fax [place]  6. Anesthesia: None listed.  Attempted to contact the office and phone message said they were closed.

## 2017-12-11 NOTE — Telephone Encounter (Signed)
Spoke to patient, scheduled to see J. Phylliss Bob 3/25 at 8:45 AM at [facility].     Attempt to call [facility] office to make aware.

## 2017-12-11 NOTE — Telephone Encounter (Signed)
   Primary Cardiologist:Kenneth C Hilty, MD  Chart reviewed as part of pre-operative protocol coverage. Because of [name] past medical history and time since last visit, he/she will require a follow-up visit in order to better assess preoperative cardiovascular risk.  Pre-op covering staff: - Please schedule appointment and call patient to inform them. - Please contact requesting surgeon's office via preferred method (i.e, phone, fax) to inform them of need for appointment prior to surgery.  Rosaria Ferries, PA-C  12/11/2017, 3:15 PM

## 2017-12-16 NOTE — Progress Notes (Signed)
Cardiology Office Note:    Date:  12/17/2017   ID:  Dean [name], DOB 05-02-1951, MRN [id number]  PCP:  [name], MD  Cardiologist:  [name], MD  Referring MD: [name], MD   Chief Complaint  Patient presents with  . Medical Clearance    History of Present Illness:    Dean [name] is a 67 y.o. male with a past medical history significant for asthma, type 2 diabetes, gout, hypertension, hyperlipidemia, CAD, ischemic cardiomyopathy.  [name] collapsed in 08/2016 with cardiac arrest.  He was treated for cardiogenic shock with Arctic sun protocol and found to have elevated troponins.  Cardiac catheterization revealed 90% ostial stenosis of the distal RCA with left-to-right collaterals and 100% stenosis to mid RCA to distal RCA.  Was treated medically and an ICD was implanted on 10/10/16.  Is followed by Dr. Curt Bears, last seen in 06/2017 at which time he was doing well.  Mr. Lipkin was seen in the emergency department in January for complaints of feeling his defibrillator vibrating.  His ICD was interrogated by the [employer] representative and was found to be functioning properly with no alerts or alarms.  Last interrogation on 10/04/2017 showed normal device functioning, battery and lead parameters stable.  Pt is here today for cardiac clearance for L2-3, L3-4, L4-5 Central Decompression with his daughter. He occasionally weighs at home, 276-279.  Has mild lower extremity edema. His activity has been very limited due to back pain and nerve pain down his legs. Walking with a cane. No orthopnea, PND, lightheadedness, palpitations, syncope.  No chest discomfort or dyspnea with activity. His biggest issue is the back/leg pain that causes fatigue. 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    Past Medical History:  Diagnosis Date  . 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 Arthritis    "maybe in his spine"  (09/05/2016)  . Asthma   . Cardiac arrest ([place]) 08/27/2016   REQUIRING CPR, SHOCK & MEDICATIONS  . Gout   . High cholesterol   . Hypertension   . OSA on CPAP   . Type II diabetes mellitus ([place])     Past Surgical History:  Procedure Laterality Date  . CARDIAC CATHETERIZATION N/A 09/07/2016   Procedure: Left Heart Cath and Coronary Angiography;  Surgeon: Peter M Martinique, MD;  Location: [facility];  Service: Cardiovascular;  Laterality: N/A;  . EP IMPLANTABLE DEVICE N/A 10/10/2016   Procedure: ICD Implant;  Surgeon: Will Meredith Leeds, MD;  Location: [facility];  Service: Cardiovascular;  Laterality: N/A;    Current Medications: Current Meds  Medication Sig  . allopurinol (ZYLOPRIM) 300 MG tablet Take 300 mg by mouth daily.  [name] amiodarone (PACERONE) 200 MG tablet TAKE 1 TABLET BY MOUTH EVERY DAY  . aspirin EC 81 MG EC tablet Take 1 tablet (81 mg total) by mouth daily.  [name] atorvastatin (LIPITOR) 40 MG tablet Take 1 tablet (40 mg total) by mouth daily at 6 PM.  . carvedilol (COREG) 25 MG tablet TAKE 1 TABLET (25 MG TOTAL) BY MOUTH 2 (TWO) TIMES DAILY WITH A MEAL.  [name] CVS VITAMIN D3 1000 units capsule Take 1,000 Units by mouth daily.  . furosemide (LASIX) 40 MG tablet Take 80 mg by mouth daily.   [name] KLOR-CON M20 20 MEQ tablet TAKE 1 TABLET BY MOUTH EVERY DAY  . losartan (COZAAR) 100 MG tablet Take 1 tablet (100 mg total) by mouth daily.  . magnesium  oxide (MAG-OX) 400 (241.3 Mg) MG tablet Take 0.5 tablets (200 mg total) by mouth 2 (two) times daily.  . metFORMIN (GLUCOPHAGE) 500 MG tablet Take 1 tablet (500 mg total) by mouth daily with breakfast. (Patient taking differently: Take 500 mg by mouth 2 (two) times daily with a meal. )  . Multiple Vitamin (MULTIVITAMIN WITH MINERALS) TABS tablet Take 1 tablet by mouth daily.     Allergies:   Contrast media [iodinated diagnostic agents] and Lisinopril   Social History   Socioeconomic History  . Marital status: Married     Spouse name: Not on file  . Number of children: Not on file  . Years of education: Not on file  . Highest education level: Not on file  Occupational History  . Not on file  Social Needs  . Financial resource strain: Not on file  . Food insecurity:    Worry: Not on file    Inability: Not on file  . Transportation needs:    Medical: Not on file    Non-medical: Not on file  Tobacco Use  . Smoking status: Never Smoker  . Smokeless tobacco: Never Used  Substance and Sexual Activity  . Alcohol use: No  . Drug use: No  . Sexual activity: Not on file  Lifestyle  . Physical activity:    Days per week: Not on file    Minutes per session: Not on file  . Stress: Not on file  Relationships  . Social connections:    Talks on phone: Not on file    Gets together: Not on file    Attends religious service: Not on file    Active member of club or organization: Not on file    Attends meetings of clubs or organizations: Not on file    Relationship status: Not on file  Other Topics Concern  . Not on file  Social History Narrative  . Not on file    Family History: The patient's family history includes Cancer in his brother; Heart attack in his father; Hypertension in his sister and sister. ROS:   Please see the history of present illness.     All other systems reviewed and are negative.  EKGs/Labs/Other Studies Reviewed:    The following studies were reviewed today:  Left Heart Cath and Coronary Angiography 09/07/2016  Conclusion    There is moderate left ventricular systolic dysfunction.  LV end diastolic pressure is normal.  The left ventricular ejection fraction is 35-45% by visual estimate.  Ost RCA lesion, 90 %stenosed.  Mid RCA to Dist RCA lesion, 100 %stenosed.   1. Single vessel occlusive CAD. The distal RCA is occluded with left to right collaterals. There is a 90% ostial stenosis 2. Moderate LV dysfunction with EF 35-40%. Akinesis of the basal to mid inferior  wall 3. Normal LVEDP  Plan: medical therapy. If he has significant angina we could consider PCI of the RCA but there appears to be significant scar of the inferior wall.    Echocardiogram 08/28/2016 Study Conclusions - Left ventricle: The cavity size was mildly dilated. Wall   thickness was increased in a pattern of severe LVH. Systolic   function was moderately reduced. The estimated ejection fraction   was in the range of 35% to 40%. Akinesis of the basalinferior and   apical myocardium. Features are consistent with a pseudonormal   left ventricular filling pattern, with concomitant abnormal   relaxation and increased filling pressure (grade 2 diastolic   dysfunction). -  Aortic valve: There was mild regurgitation. - Mitral valve: There was mild regurgitation. - Left atrium: The atrium was severely dilated.   EKG:  EKG is not ordered today.    Recent Labs: 07/04/2017: TSH 2.380 10/05/2017: ALT 21; BUN 20; Creatinine, Ser 1.35; Hemoglobin 12.3; Platelets 237; Potassium 3.9; Sodium 137   Recent Lipid Panel    Component Value Date/Time   CHOL 159 09/07/2016 0844   TRIG 146 09/07/2016 0844   HDL 25 (L) 09/07/2016 0844   CHOLHDL 6.4 09/07/2016 0844   VLDL 29 09/07/2016 0844   LDLCALC 105 (H) 09/07/2016 0844    Physical Exam:    VS:  BP (!) 142/70 (BP Location: Left Arm, Patient Position: Sitting, Cuff Size: Normal)   Pulse 75   Ht 5\' 6"  (1.676 m)   Wt 279 lb 12.8 oz (126.9 kg)   SpO2 98%   BMI 45.16 kg/m     Wt Readings from Last 3 Encounters:  12/17/17 279 lb 12.8 oz (126.9 kg)  12/05/17 274 lb (124.3 kg)  10/05/17 275 lb (124.7 kg)     Physical Exam  Constitutional: He is oriented to person, place, and time. He appears well-developed and well-nourished.  Obese male  HENT:  Head: Normocephalic and atraumatic.  Neck: Normal range of motion. Neck supple. No JVD present. Carotid bruit is not present.  Cardiovascular: Normal rate, regular rhythm, normal heart  sounds and intact distal pulses. Exam reveals no gallop and no friction rub.  No murmur heard. Pulmonary/Chest: Effort normal and breath sounds normal. No respiratory distress. He has no wheezes. He has no rales.  Abdominal: Soft. Bowel sounds are normal.  Musculoskeletal: He exhibits edema.  Altered gait due to back pain, using cane. Trace lower leg edema on R, trace-1+ lower leg edema on L.  Neurological: He is alert and oriented to person, place, and time.  Skin: Skin is warm and dry.  Psychiatric: He has a normal mood and affect. His behavior is normal. Thought content normal.    ASSESSMENT:    1. Cardiomyopathy, ischemic   2. Coronary artery disease involving native coronary artery of native heart without angina pectoris   3. ICD (implantable cardioverter-defibrillator) in place   4. Benign essential HTN   5. Diabetes mellitus type 2 in obese ([place])   6. Hyperlipidemia LDL goal <70    PLAN:    In order of problems listed above:  CAD: Status post cardiac arrest in 2017 with nearly occluded RCA and left to right collaterals, treated medically.  On aspirin 81 mg, beta-blocker, high intensity statin. No anginal symptoms. Activity is very limited by back and leg pain.   Ischemic cardiomyopathy: EF 16-10%, grade 2 diastolic dysfunction after cardiac arrest in 08/2016.  Management includes carvedilol 25 mg twice daily, losartan 100 mg daily, furosemide 80 mg daily. Has mild lower extremity edema. No orhopnea or PND.  No significant volume overload. Not weighing daily or watching sodium intake. We discussed 2000 mg sodium restriction, daily wts and report wt gain of 3 lbs in 1 day, 5 pounds in 1 week, report increased dyspnea, orthopnea and/or edema.   History V. Tach: Amiodarone 200 mg daily. ICD placed in 09/2016. Normal functioning with no shocks.   Hypertension: Managed with Cozaar 100 mg daily, labetalol 25 mg twice daily. Well controlled. Will check renal function and potassium.    Diabetes type 2: On Metformin.  Last hemoglobin A1c in epic was 5.5 on 08/27/2016.  Pt repots recent A1c 5.7-6. Goal  A1c is less than 6.5  Hyperlipidemia: On atorvastatin 40 mg daily.  Last lipid panel in epic was 08/2016, LDL 105.  Followed by PCP. Pt reports TC 140 with HDL 38. He does not recall LDL. Goal LDL less than 70- reviewed with pt.   Pre-operatively clearance for L2-3, L3-4, L4-5 Central Decompression: Pt has hx of CAD, ICM and DM. His is a class III risk with 6.6% risk of major cardiac event perioperatively per the RCRI risk calculator. He has had no recent anginal or heart failure type symptoms. His functional status is very limited by back and leg pain. He is at acceptable risk for the procedure from a cardiac standpoint with no further cardiac testing indicated.    Medication Adjustments/Labs and Tests Ordered: Current medicines are reviewed at length with the patient today.  Concerns regarding medicines are outlined above. Labs and tests ordered and medication changes are outlined in the patient instructions below:  Patient Instructions  Medication Instructions:  Your physician recommends that you continue on your current medications as directed. Please refer to the Current Medication list given to you today.  Labwork: BMET today  Testing/Procedures: None  Follow-Up: Your physician wants you to follow-up in: October with Dr. Curt Bears. You will receive a reminder letter in the mail two months in advance. If you don't receive a letter, please call our office to schedule the follow-up appointment.   Any Other Special Instructions Will Be Listed Below (If Applicable).  The goal for your LDL (bad) cholesterol is 70 or less.  Please monitor your weight daily.  Weigh each day at the same time with the same amount of clothing on.  Please contact the office if you gain 3 pounds overnight or 5 pounds in one week.  Also contact the office if you notice swelling, become short of  breath or have a hard time breathing when you lay down.    If you need a refill on your cardiac medications before your next appointment, please call your pharmacy.   DASH Eating Plan DASH stands for "Dietary Approaches to Stop Hypertension." The DASH eating plan is a healthy eating plan that has been shown to reduce high blood pressure (hypertension). It may also reduce your risk for type 2 diabetes, heart disease, and stroke. The DASH eating plan may also help with weight loss. What are tips for following this plan? General guidelines  Avoid eating more than 2,300 mg (milligrams) of salt (sodium) a day. If you have hypertension, you may need to reduce your sodium intake to 1,500 mg a day.  Limit alcohol intake to no more than 1 drink a day for nonpregnant women and 2 drinks a day for men. One drink equals 12 oz of beer, 5 oz of wine, or 1 oz of hard liquor.  Work with your health care provider to maintain a healthy body weight or to lose weight. Ask what an ideal weight is for you.  Get at least 30 minutes of exercise that causes your heart to beat faster (aerobic exercise) most days of the week. Activities may include walking, swimming, or biking.  Work with your health care provider or diet and nutrition specialist (dietitian) to adjust your eating plan to your individual calorie needs. Reading food labels  Check food labels for the amount of sodium per serving. Choose foods with less than 5 percent of the Daily Value of sodium. Generally, foods with less than 300 mg of sodium per serving fit into this eating plan.  To find whole grains, look for the word "whole" as the first word in the ingredient list. Shopping  Buy products labeled as "low-sodium" or "no salt added."  Buy fresh foods. Avoid canned foods and premade or frozen meals. Cooking  Avoid adding salt when cooking. Use salt-free seasonings or herbs instead of table salt or sea salt. Check with your health care provider  or pharmacist before using salt substitutes.  Do not fry foods. Cook foods using healthy methods such as baking, boiling, grilling, and broiling instead.  Cook with heart-healthy oils, such as olive, canola, soybean, or sunflower oil. Meal planning   Eat a balanced diet that includes: ? 5 or more servings of fruits and vegetables each day. At each meal, try to fill half of your plate with fruits and vegetables. ? Up to 6-8 servings of whole grains each day. ? Less than 6 oz of lean meat, poultry, or fish each day. A 3-oz serving of meat is about the same size as a deck of cards. One egg equals 1 oz. ? 2 servings of low-fat dairy each day. ? A serving of nuts, seeds, or beans 5 times each week. ? Heart-healthy fats. Healthy fats called Omega-3 fatty acids are found in foods such as flaxseeds and coldwater fish, like sardines, salmon, and mackerel.  Limit how much you eat of the following: ? Canned or prepackaged foods. ? Food that is high in trans fat, such as fried foods. ? Food that is high in saturated fat, such as fatty meat. ? Sweets, desserts, sugary drinks, and other foods with added sugar. ? Full-fat dairy products.  Do not salt foods before eating.  Try to eat at least 2 vegetarian meals each week.  Eat more home-cooked food and less restaurant, buffet, and fast food.  When eating at a restaurant, ask that your food be prepared with less salt or no salt, if possible. What foods are recommended? The items listed may not be a complete list. Talk with your dietitian about what dietary choices are best for you. Grains Whole-grain or whole-wheat bread. Whole-grain or whole-wheat pasta. Brown rice. [place]. Bulgur. Whole-grain and low-sodium cereals. Pita bread. Low-fat, low-sodium crackers. Whole-wheat flour tortillas. Vegetables Fresh or frozen vegetables (raw, steamed, roasted, or grilled). Low-sodium or reduced-sodium tomato and vegetable juice. Low-sodium or  reduced-sodium tomato sauce and tomato paste. Low-sodium or reduced-sodium canned vegetables. Fruits All fresh, dried, or frozen fruit. Canned fruit in natural juice (without added sugar). Meat and other protein foods Skinless chicken or [place]. Ground chicken or [place]. Pork with fat trimmed off. Fish and seafood. Egg whites. Dried beans, peas, or lentils. Unsalted nuts, nut butters, and seeds. Unsalted canned beans. Lean cuts of beef with fat trimmed off. Low-sodium, lean deli meat. Dairy Low-fat (1%) or fat-free (skim) milk. Fat-free, low-fat, or reduced-fat cheeses. Nonfat, low-sodium ricotta or cottage cheese. Low-fat or nonfat yogurt. Low-fat, low-sodium cheese. Fats and oils Soft margarine without trans fats. Vegetable oil. Low-fat, reduced-fat, or light mayonnaise and salad dressings (reduced-sodium). Canola, safflower, olive, soybean, and sunflower oils. Avocado. Seasoning and other foods Herbs. Spices. Seasoning mixes without salt. Unsalted popcorn and pretzels. Fat-free sweets. What foods are not recommended? The items listed may not be a complete list. Talk with your dietitian about what dietary choices are best for you. Grains Baked goods made with fat, such as croissants, muffins, or some breads. Dry pasta or rice meal packs. Vegetables Creamed or fried vegetables. Vegetables in a cheese sauce. Regular canned vegetables (  not low-sodium or reduced-sodium). Regular canned tomato sauce and paste (not low-sodium or reduced-sodium). Regular tomato and vegetable juice (not low-sodium or reduced-sodium). Angie Fava. Olives. Fruits Canned fruit in a light or heavy syrup. Fried fruit. Fruit in cream or butter sauce. Meat and other protein foods Fatty cuts of meat. Ribs. Fried meat. Berniece Salines. Sausage. Bologna and other processed lunch meats. Salami. Fatback. Hotdogs. Bratwurst. Salted nuts and seeds. Canned beans with added salt. Canned or smoked fish. Whole eggs or egg yolks. Chicken or [place]  with skin. Dairy Whole or 2% milk, cream, and half-and-half. Whole or full-fat cream cheese. Whole-fat or sweetened yogurt. Full-fat cheese. Nondairy creamers. Whipped toppings. Processed cheese and cheese spreads. Fats and oils Butter. Stick margarine. Lard. Shortening. Ghee. Bacon fat. Tropical oils, such as coconut, palm kernel, or palm oil. Seasoning and other foods Salted popcorn and pretzels. Onion salt, garlic salt, seasoned salt, table salt, and sea salt. Worcestershire sauce. Tartar sauce. Barbecue sauce. Teriyaki sauce. Soy sauce, including reduced-sodium. Steak sauce. Canned and packaged gravies. Fish sauce. Oyster sauce. Cocktail sauce. Horseradish that you find on the shelf. Ketchup. Mustard. Meat flavorings and tenderizers. Bouillon cubes. Hot sauce and Tabasco sauce. Premade or packaged marinades. Premade or packaged taco seasonings. Relishes. Regular salad dressings. Where to find more information:  National Heart, Lung, and [place]: https://wilson-eaton.com/  American Heart Association: www.heart.org Summary  The DASH eating plan is a healthy eating plan that has been shown to reduce high blood pressure (hypertension). It may also reduce your risk for type 2 diabetes, heart disease, and stroke.  With the DASH eating plan, you should limit salt (sodium) intake to 2,300 mg a day. If you have hypertension, you may need to reduce your sodium intake to 1,500 mg a day.  When on the DASH eating plan, aim to eat more fresh fruits and vegetables, whole grains, lean proteins, low-fat dairy, and heart-healthy fats.  Work with your health care provider or diet and nutrition specialist (dietitian) to adjust your eating plan to your individual calorie needs. This information is not intended to replace advice given to you by your health care provider. Make sure you discuss any questions you have with your health care provider. Document Released: 08/31/2011 Document Revised: 09/04/2016  Document Reviewed: 09/04/2016 Elsevier Interactive Patient Education  2018 [place], Daune Perch, NP  12/17/2017 9:56 AM     [facility]

## 2017-12-17 ENCOUNTER — Ambulatory Visit: Payer: Medicare HMO | Admitting: Cardiology

## 2017-12-17 ENCOUNTER — Encounter: Payer: Self-pay | Admitting: Cardiology

## 2017-12-17 VITALS — BP 142/70 | HR 75 | Ht 66.0 in | Wt 279.8 lb

## 2017-12-17 DIAGNOSIS — E785 Hyperlipidemia, unspecified: Secondary | ICD-10-CM | POA: Diagnosis not present

## 2017-12-17 DIAGNOSIS — E669 Obesity, unspecified: Secondary | ICD-10-CM

## 2017-12-17 DIAGNOSIS — Z95810 Presence of automatic (implantable) cardiac defibrillator: Secondary | ICD-10-CM | POA: Diagnosis not present

## 2017-12-17 DIAGNOSIS — I255 Ischemic cardiomyopathy: Secondary | ICD-10-CM

## 2017-12-17 DIAGNOSIS — E1169 Type 2 diabetes mellitus with other specified complication: Secondary | ICD-10-CM

## 2017-12-17 DIAGNOSIS — I10 Essential (primary) hypertension: Secondary | ICD-10-CM

## 2017-12-17 DIAGNOSIS — I2510 Atherosclerotic heart disease of native coronary artery without angina pectoris: Secondary | ICD-10-CM

## 2017-12-17 LAB — BASIC METABOLIC PANEL
BUN/Creatinine Ratio: 12 (ref 10–24)
BUN: 16 mg/dL (ref 8–27)
CO2: 22 mmol/L (ref 20–29)
Calcium: 10.1 mg/dL (ref 8.6–10.2)
Chloride: 102 mmol/L (ref 96–106)
Creatinine, Ser: 1.34 mg/dL — ABNORMAL HIGH (ref 0.76–1.27)
GFR calc Af Amer: 62 mL/min/{1.73_m2} (ref >59)
GFR calc non Af Amer: 54 mL/min/{1.73_m2} — ABNORMAL LOW (ref >59)
Glucose: 100 mg/dL — ABNORMAL HIGH (ref 65–99)
Potassium: 4.2 mmol/L (ref 3.5–5.2)
Sodium: 138 mmol/L (ref 134–144)

## 2017-12-17 NOTE — Patient Instructions (Addendum)
Medication Instructions:  Your physician recommends that you continue on your current medications as directed. Please refer to the Current Medication list given to you today.  Labwork: BMET today  Testing/Procedures: None  Follow-Up: Your physician wants you to follow-up in: October with Dr. Curt Bears. You will receive a reminder letter in the mail two months in advance. If you don't receive a letter, please call our office to schedule the follow-up appointment.   Any Other Special Instructions Will Be Listed Below (If Applicable).  The goal for your LDL (bad) cholesterol is 70 or less.  Please monitor your weight daily.  Weigh each day at the same time with the same amount of clothing on.  Please contact the office if you gain 3 pounds overnight or 5 pounds in one week.  Also contact the office if you notice swelling, become short of breath or have a hard time breathing when you lay down.    If you need a refill on your cardiac medications before your next appointment, please call your pharmacy.   DASH Eating Plan DASH stands for "Dietary Approaches to Stop Hypertension." The DASH eating plan is a healthy eating plan that has been shown to reduce high blood pressure (hypertension). It may also reduce your risk for type 2 diabetes, heart disease, and stroke. The DASH eating plan may also help with weight loss. What are tips for following this plan? General guidelines  Avoid eating more than 2,300 mg (milligrams) of salt (sodium) a day. If you have hypertension, you may need to reduce your sodium intake to 1,500 mg a day.  Limit alcohol intake to no more than 1 drink a day for nonpregnant women and 2 drinks a day for men. One drink equals 12 oz of beer, 5 oz of wine, or 1 oz of hard liquor.  Work with your health care provider to maintain a healthy body weight or to lose weight. Ask what an ideal weight is for you.  Get at least 30 minutes of exercise that causes your heart to beat  faster (aerobic exercise) most days of the week. Activities may include walking, swimming, or biking.  Work with your health care provider or diet and nutrition specialist (dietitian) to adjust your eating plan to your individual calorie needs. Reading food labels  Check food labels for the amount of sodium per serving. Choose foods with less than 5 percent of the Daily Value of sodium. Generally, foods with less than 300 mg of sodium per serving fit into this eating plan.  To find whole grains, look for the word "whole" as the first word in the ingredient list. Shopping  Buy products labeled as "low-sodium" or "no salt added."  Buy fresh foods. Avoid canned foods and premade or frozen meals. Cooking  Avoid adding salt when cooking. Use salt-free seasonings or herbs instead of table salt or sea salt. Check with your health care provider or pharmacist before using salt substitutes.  Do not fry foods. Cook foods using healthy methods such as baking, boiling, grilling, and broiling instead.  Cook with heart-healthy oils, such as olive, canola, soybean, or sunflower oil. Meal planning   Eat a balanced diet that includes: ? 5 or more servings of fruits and vegetables each day. At each meal, try to fill half of your plate with fruits and vegetables. ? Up to 6-8 servings of whole grains each day. ? Less than 6 oz of lean meat, poultry, or fish each day. A 3-oz serving of meat  is about the same size as a deck of cards. One egg equals 1 oz. ? 2 servings of low-fat dairy each day. ? A serving of nuts, seeds, or beans 5 times each week. ? Heart-healthy fats. Healthy fats called Omega-3 fatty acids are found in foods such as flaxseeds and coldwater fish, like sardines, salmon, and mackerel.  Limit how much you eat of the following: ? Canned or prepackaged foods. ? Food that is high in trans fat, such as fried foods. ? Food that is high in saturated fat, such as fatty meat. ? Sweets, desserts,  sugary drinks, and other foods with added sugar. ? Full-fat dairy products.  Do not salt foods before eating.  Try to eat at least 2 vegetarian meals each week.  Eat more home-cooked food and less restaurant, buffet, and fast food.  When eating at a restaurant, ask that your food be prepared with less salt or no salt, if possible. What foods are recommended? The items listed may not be a complete list. Talk with your dietitian about what dietary choices are best for you. Grains Whole-grain or whole-wheat bread. Whole-grain or whole-wheat pasta. Brown rice. [place] Morrow. Bulgur. Whole-grain and low-sodium cereals. Pita bread. Low-fat, low-sodium crackers. Whole-wheat flour tortillas. Vegetables Fresh or frozen vegetables (raw, steamed, roasted, or grilled). Low-sodium or reduced-sodium tomato and vegetable juice. Low-sodium or reduced-sodium tomato sauce and tomato paste. Low-sodium or reduced-sodium canned vegetables. Fruits All fresh, dried, or frozen fruit. Canned fruit in natural juice (without added sugar). Meat and other protein foods Skinless chicken or [place]. Ground chicken or [place]. Pork with fat trimmed off. Fish and seafood. Egg whites. Dried beans, peas, or lentils. Unsalted nuts, nut butters, and seeds. Unsalted canned beans. Lean cuts of beef with fat trimmed off. Low-sodium, lean deli meat. Dairy Low-fat (1%) or fat-free (skim) milk. Fat-free, low-fat, or reduced-fat cheeses. Nonfat, low-sodium ricotta or cottage cheese. Low-fat or nonfat yogurt. Low-fat, low-sodium cheese. Fats and oils Soft margarine without trans fats. Vegetable oil. Low-fat, reduced-fat, or light mayonnaise and salad dressings (reduced-sodium). Canola, safflower, olive, soybean, and sunflower oils. Avocado. Seasoning and other foods Herbs. Spices. Seasoning mixes without salt. Unsalted popcorn and pretzels. Fat-free sweets. What foods are not recommended? The items listed may not be a complete list.  Talk with your dietitian about what dietary choices are best for you. Grains Baked goods made with fat, such as croissants, muffins, or some breads. Dry pasta or rice meal packs. Vegetables Creamed or fried vegetables. Vegetables in a cheese sauce. Regular canned vegetables (not low-sodium or reduced-sodium). Regular canned tomato sauce and paste (not low-sodium or reduced-sodium). Regular tomato and vegetable juice (not low-sodium or reduced-sodium). Angie Fava. Olives. Fruits Canned fruit in a light or heavy syrup. Fried fruit. Fruit in cream or butter sauce. Meat and other protein foods Fatty cuts of meat. Ribs. Fried meat. Berniece Salines. Sausage. Bologna and other processed lunch meats. Salami. Fatback. Hotdogs. Bratwurst. Salted nuts and seeds. Canned beans with added salt. Canned or smoked fish. Whole eggs or egg yolks. Chicken or [place] with skin. Dairy Whole or 2% milk, cream, and half-and-half. Whole or full-fat cream cheese. Whole-fat or sweetened yogurt. Full-fat cheese. Nondairy creamers. Whipped toppings. Processed cheese and cheese spreads. Fats and oils Butter. Stick margarine. Lard. Shortening. Ghee. Bacon fat. Tropical oils, such as coconut, palm kernel, or palm oil. Seasoning and other foods Salted popcorn and pretzels. Onion salt, garlic salt, seasoned salt, table salt, and sea salt. Worcestershire sauce. Tartar sauce. Barbecue sauce. Teriyaki sauce.  Soy sauce, including reduced-sodium. Steak sauce. Canned and packaged gravies. Fish sauce. Oyster sauce. Cocktail sauce. Horseradish that you find on the shelf. Ketchup. Mustard. Meat flavorings and tenderizers. Bouillon cubes. Hot sauce and Tabasco sauce. Premade or packaged marinades. Premade or packaged taco seasonings. Relishes. Regular salad dressings. Where to find more information:  National Heart, Lung, and [place]: https://wilson-eaton.com/  American Heart Association: www.heart.org Summary  The DASH eating plan is a healthy eating  plan that has been shown to reduce high blood pressure (hypertension). It may also reduce your risk for type 2 diabetes, heart disease, and stroke.  With the DASH eating plan, you should limit salt (sodium) intake to 2,300 mg a day. If you have hypertension, you may need to reduce your sodium intake to 1,500 mg a day.  When on the DASH eating plan, aim to eat more fresh fruits and vegetables, whole grains, lean proteins, low-fat dairy, and heart-healthy fats.  Work with your health care provider or diet and nutrition specialist (dietitian) to adjust your eating plan to your individual calorie needs. This information is not intended to replace advice given to you by your health care provider. Make sure you discuss any questions you have with your health care provider. Document Released: 08/31/2011 Document Revised: 09/04/2016 Document Reviewed: 09/04/2016 Elsevier Interactive Patient Education  [employer].

## 2018-01-02 ENCOUNTER — Ambulatory Visit (INDEPENDENT_AMBULATORY_CARE_PROVIDER_SITE_OTHER): Payer: Medicare HMO | Admitting: *Deleted

## 2018-01-02 DIAGNOSIS — I255 Ischemic cardiomyopathy: Secondary | ICD-10-CM

## 2018-01-02 NOTE — Progress Notes (Signed)
Remote ICD transmission.   

## 2018-01-03 ENCOUNTER — Encounter: Payer: Self-pay | Admitting: Cardiology

## 2018-01-09 ENCOUNTER — Encounter (INDEPENDENT_AMBULATORY_CARE_PROVIDER_SITE_OTHER): Payer: Self-pay | Admitting: Surgery

## 2018-01-09 ENCOUNTER — Ambulatory Visit (INDEPENDENT_AMBULATORY_CARE_PROVIDER_SITE_OTHER): Payer: Medicare HMO | Admitting: Surgery

## 2018-01-09 VITALS — BP 149/61 | HR 92 | Temp 97.4°F | Ht 66.0 in | Wt 272.0 lb

## 2018-01-09 DIAGNOSIS — M48062 Spinal stenosis, lumbar region with neurogenic claudication: Secondary | ICD-10-CM

## 2018-01-09 NOTE — Progress Notes (Signed)
67 year old black male with history of L2-L5 spinal stenosis came in today for preoperative history and physical.  We received preop medical and cardiac clearances.  Full H&P placed in patient's chart.

## 2018-01-09 NOTE — H&P (Signed)
Dean [name] is an 67 y.o. male.   Chief Complaint: Low back pain and neurogenic claudication   HPI: Patient with history of L2-L5 spinal stenosis and the above complaint presents for preop evaluation for L2-L5 decompression.  Progressively worsening symptoms.  Failed conservative treatment.  We received preop cardiac and medical clearances.  Patient does have a history of sleep apnea and does have CPAP but states that he does not use this all the time.  Past Medical History:  Diagnosis Date  . 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 Arthritis    "maybe in his spine" (09/05/2016)  . Asthma   . Cardiac arrest ([place]) 08/27/2016   REQUIRING CPR, SHOCK & MEDICATIONS  . Gout   . High cholesterol   . Hypertension   . OSA on CPAP   . Type II diabetes mellitus ([place])     Past Surgical History:  Procedure Laterality Date  . CARDIAC CATHETERIZATION N/A 09/07/2016   Procedure: Left Heart Cath and Coronary Angiography;  Surgeon: Peter M Martinique, MD;  Location: [place] [facility];  Service: Cardiovascular;  Laterality: N/A;  . EP IMPLANTABLE DEVICE N/A 10/10/2016   Procedure: ICD Implant;  Surgeon: Will Meredith Leeds, MD;  Location: [place] [facility];  Service: Cardiovascular;  Laterality: N/A;    Family History  Problem Relation Age of Onset  . Heart attack Father        died in his 41's  . Hypertension Sister   . Cancer Brother        uncertain, "leg"  . Hypertension Sister    Social History:  reports that he has never smoked. He has never used smokeless tobacco. He reports that he does not drink alcohol or use drugs.  Allergies:  Allergies  Allergen Reactions  . Contrast Media [Iodinated Diagnostic Agents] Shortness Of Breath and Nausea And Vomiting  . Lisinopril Cough    No medications prior to admission.    No results found for this or any previous visit (from the past 48 hour(s)). No results found.  Review of Systems  Constitutional:  Negative.   HENT: Negative.   Respiratory: Negative.   Cardiovascular: Negative.   Gastrointestinal: Negative.   Genitourinary: Negative.   Skin: Negative.   Neurological: Positive for tingling.  Psychiatric/Behavioral: Negative.     There were no vitals taken for this visit. Physical Exam  Constitutional: He is oriented to person, place, and time. No distress.  HENT:  Head: Normocephalic and atraumatic.  Eyes: Pupils are equal, round, and reactive to light. EOM are normal.  Respiratory: Effort normal. No respiratory distress.  GI: He exhibits no distension.  Musculoskeletal:  Ortho Exam patient has some limitation of left hip in internal rotation with only 5 degrees with minimal discomfort.  Right hip internal rotation 20 degrees with mild groin pain.  Negative straight leg raising.  Anterior tib EHL is intact.  He can get on and off the exam table.  Some pain with straight leg raising anterior tib gastrocsoleus quads are intact.  Neurological: He is alert and oriented to person, place, and time.  Skin: Skin is warm and dry.  Psychiatric: He has a normal mood and affect.     Assessment/Plan L2-L5 spinal stenosis, low back pain, neurogenic claudication.  We will proceed with L2-3, L3-4 and L4-5 decompression as scheduled.  Surgical procedure along with potential risk/complications and rehab/recovery time discussed.  All questions answered and wishes to proceed.  With patient's history of sleep apnea I did instruct  him to bring his CPAP from home.  Benjiman Core, PA-C 01/09/2018, 11:27 AM

## 2018-01-10 NOTE — Progress Notes (Addendum)
Called Dr. Lorin Mercy office for orders--none in epic.  PCP: [facility] [place] Cardiologist: Dr. Curt Bears  Fasting sugars: pt. States he doesn't check, instructed pt. To do so this week prior to surgery.  Pt. Hasn't been instructed when to stop aspirin, left message for Baird Lyons @ Dr. Lorin Mercy office. Instructed pt. If he doen't hear from then by this afternoon then to call today.  Pt. Has medtronic ICD.

## 2018-01-10 NOTE — Pre-Procedure Instructions (Signed)
Dean [name]  01/10/2018      [id number] [name], [address] [name] [address] Phone: [phone] Fax: [phone]    Your procedure is scheduled on Fri. April 26  Report to [facility] Admitting at 5:30 A.M.  Call this number if you have problems the morning of surgery:  [phone]   Remember:  Do not eat food or drink liquids after midnight on Thurs. April 25   Take these medicines the morning of surgery with A SIP OF WATER : carvedilol (coreg), famotidine (pepcid),allopurinol (zyloprim),amiodarone (pacerone),                 7 days prior to surgery STOP taking any  Aleve, Naproxen, Ibuprofen, Motrin, Advil, Goody's, BC's, all herbal medications, fish oil, and all vitamins               Follow your doctors instructions regarding your Aspirin.  If no instructions were given by your doctor, then you will need to call the prescribing office office to get instructions.                     How to Manage Your Diabetes Before and After Surgery  Why is it important to control my blood sugar before and after surgery? . Improving blood sugar levels before and after surgery helps healing and can limit problems. . A way of improving blood sugar control is eating a healthy diet by: o  Eating less sugar and carbohydrates o  Increasing activity/exercise o  Talking with your doctor about reaching your blood sugar goals . High blood sugars (greater than 180 mg/dL) can raise your risk of infections and slow your recovery, so you will need to focus on controlling your diabetes during the weeks before surgery. . Make sure that the doctor who takes care of your diabetes knows about your planned surgery including the date and location.  How do I manage my blood sugar before surgery? . Check your blood sugar at least 4 times a day, starting 2 days before surgery, to make sure that the level is not too high or  low. o Check your blood sugar the morning of your surgery when you wake up and every 2 hours until you get to the Short Stay unit. . If your blood sugar is less than 70 mg/dL, you will need to treat for low blood sugar: o Do not take insulin. o Treat a low blood sugar (less than 70 mg/dL) with  cup of clear juice (cranberry or apple), 4 glucose tablets, OR glucose gel. Recheck blood sugar in 15 minutes after treatment (to make sure it is greater than 70 mg/dL). If your blood sugar is not greater than 70 mg/dL on recheck, call [phone] o  for further instructions. . Report your blood sugar to the short stay nurse when you get to Short Stay.  . If you are admitted to the hospital after surgery: o Your blood sugar will be checked by the staff and you will probably be given insulin after surgery (instead of oral diabetes medicines) to make sure you have good blood sugar levels. o The goal for blood sugar control after surgery is 80-180 mg/dL.      WHAT DO I DO ABOUT MY DIABETES MEDICATION?    [name] Do not take oral diabetes medicines (pills) the morning of surgery.          METFORMIN (  GLUCOPHAGE)     Do not wear jewelry.  Do not wear lotions, powders, or perfumes, or deodorant.  Do not shave 48 hours prior to surgery.  Men may shave face and neck.  Do not bring valuables to the hospital.  [facility] is not responsible for any belongings or valuables.  Contacts, dentures or bridgework may not be worn into surgery.  Leave your suitcase in the car.  After surgery it may be brought to your room.  For patients admitted to the hospital, discharge time will be determined by your treatment team.  Patients discharged the day of surgery will not be allowed to drive home.    Special instructions:  [name]- Preparing For Surgery  Before surgery, you can play an important role. Because skin is not sterile, your skin needs to be as free of germs as possible. You can reduce the number of  germs on your skin by washing with CHG (chlorahexidine gluconate) Soap before surgery.  CHG is an antiseptic cleaner which kills germs and bonds with the skin to continue killing germs even after washing.  Please do not use if you have an allergy to CHG or antibacterial soaps. If your skin becomes reddened/irritated stop using the CHG.  Do not shave (including legs and underarms) for at least 48 hours prior to first CHG shower. It is OK to shave your face.  Please follow these instructions carefully.   1. Shower the NIGHT BEFORE SURGERY and the MORNING OF SURGERY with CHG.   2. If you chose to wash your hair, wash your hair first as usual with your normal shampoo.  3. After you shampoo, rinse your hair and body thoroughly to remove the shampoo.  4. Use CHG as you would any other liquid soap. You can apply CHG directly to the skin and wash gently with a scrungie or a clean washcloth.   5. Apply the CHG Soap to your body ONLY FROM THE NECK DOWN.  Do not use on open wounds or open sores. Avoid contact with your eyes, ears, mouth and genitals (private parts). Wash Face and genitals (private parts)  with your normal soap.  6. Wash thoroughly, paying special attention to the area where your surgery will be performed.  7. Thoroughly rinse your body with warm water from the neck down.  8. DO NOT shower/wash with your normal soap after using and rinsing off the CHG Soap.  9. Pat yourself dry with a CLEAN TOWEL.  10. Wear CLEAN PAJAMAS to bed the night before surgery, wear comfortable clothes the morning of surgery  11. Place CLEAN SHEETS on your bed the night of your first shower and DO NOT SLEEP WITH PETS.    Day of Surgery: Do not apply any deodorants/lotions. Please wear clean clothes to the hospital/surgery center.      Please read over the following fact sheets that you were given. Coughing and Deep Breathing, MRSA Information and Surgical Site Infection Prevention

## 2018-01-11 ENCOUNTER — Encounter (HOSPITAL_COMMUNITY): Payer: Self-pay

## 2018-01-11 ENCOUNTER — Other Ambulatory Visit: Payer: Self-pay

## 2018-01-11 ENCOUNTER — Encounter (HOSPITAL_COMMUNITY)
Admission: RE | Admit: 2018-01-11 | Discharge: 2018-01-11 | Disposition: A | Discharge status: Home / Self Care | Payer: Medicare HMO | Source: Ambulatory Visit | Attending: Orthopaedic Surgery | Admitting: Orthopaedic Surgery

## 2018-01-11 DIAGNOSIS — E119 Type 2 diabetes mellitus without complications: Secondary | ICD-10-CM | POA: Insufficient documentation

## 2018-01-11 DIAGNOSIS — Z01810 Encounter for preprocedural cardiovascular examination: Secondary | ICD-10-CM | POA: Insufficient documentation

## 2018-01-11 HISTORY — DX: Cardiac arrhythmia, unspecified: I49.9

## 2018-01-11 HISTORY — DX: Left bundle-branch block, unspecified: I44.7

## 2018-01-11 HISTORY — DX: Ischemic cardiomyopathy: I25.5

## 2018-01-11 HISTORY — DX: Atherosclerotic heart disease of native coronary artery without angina pectoris: I25.10

## 2018-01-11 HISTORY — DX: Heart failure, unspecified: I50.9

## 2018-01-11 LAB — CBC
HCT: 41.4 % (ref 39.0–52.0)
Hemoglobin: 13 g/dL (ref 13.0–17.0)
MCH: 26.8 pg (ref 26.0–34.0)
MCHC: 31.4 g/dL (ref 30.0–36.0)
MCV: 85.4 fL (ref 78.0–100.0)
Platelets: 197 10*3/uL (ref 150–400)
RBC: 4.85 MIL/uL (ref 4.22–5.81)
RDW: 15 % (ref 11.5–15.5)
WBC: 8.9 10*3/uL (ref 4.0–10.5)

## 2018-01-11 LAB — BASIC METABOLIC PANEL
Anion gap: 10 (ref 5–15)
BUN: 17 mg/dL (ref 6–20)
CO2: 24 mmol/L (ref 22–32)
Calcium: 10.3 mg/dL (ref 8.9–10.3)
Chloride: 104 mmol/L (ref 101–111)
Creatinine, Ser: 1.44 mg/dL — ABNORMAL HIGH (ref 0.61–1.24)
GFR calc Af Amer: 56 mL/min — ABNORMAL LOW (ref >60)
GFR calc non Af Amer: 48 mL/min — ABNORMAL LOW (ref >60)
Glucose, Bld: 96 mg/dL (ref 65–99)
Potassium: 4.8 mmol/L (ref 3.5–5.1)
Sodium: 138 mmol/L (ref 135–145)

## 2018-01-11 LAB — GLUCOSE, CAPILLARY: Glucose-Capillary: 130 mg/dL — ABNORMAL HIGH (ref 65–99)

## 2018-01-11 LAB — SURGICAL PCR SCREEN
MRSA, PCR: NEGATIVE
Staphylococcus aureus: NEGATIVE

## 2018-01-14 ENCOUNTER — Encounter (HOSPITAL_COMMUNITY): Payer: Self-pay

## 2018-01-14 ENCOUNTER — Telehealth (INDEPENDENT_AMBULATORY_CARE_PROVIDER_SITE_OTHER): Payer: Self-pay | Admitting: Orthopaedic Surgery

## 2018-01-14 NOTE — Progress Notes (Addendum)
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 AICD implanted 10/10/16. Has cardiac clearance for surgery.     VS: BP (!) 181/62   Pulse 80   Temp 36.7 C (Oral)   Resp 20   Ht 5\' 6"  (1.676 m)   Wt 278 lb 2 oz (126.2 kg)   SpO2 100%   BMI 44.89 kg/m   PROVIDERS: [name], MD EP cardiologist is Allegra Lai, MD. Pt cleared for surgery at last office visit 12/17/17 with Daune Perch, NP   LABS: Labs reviewed: Acceptable for surgery. (all labs ordered are listed, but only abnormal results are displayed)  Labs Reviewed  GLUCOSE, CAPILLARY - Abnormal; Notable for the following components:      Result Value   Glucose-Capillary 130 (*)    All other components within normal limits  BASIC METABOLIC PANEL - Abnormal; Notable for the following components:   Creatinine, Ser 1.44 (*)    GFR calc non Af Amer 48 (*)    GFR calc Af Amer 56 (*)    All other components within normal limits  SURGICAL PCR SCREEN  CBC     IMAGES:  CXR 10/06/17: Stable cardiomegaly and pulmonary vascular congestion.   EKG 10/05/17: Sinus rhythm with 1st degree A-V block. Right axis deviation. LBBB  CV:  Cardiac cath 09/07/16:   There is moderate left ventricular systolic dysfunction.  LV end diastolic pressure is normal.  The left ventricular ejection fraction is 35-45% by visual estimate.  Ost RCA lesion, 90 %stenosed.  Mid RCA to Dist RCA lesion, 100 %stenosed. 1. Single vessel occlusive CAD. The distal RCA is occluded with left  to right collaterals. There is a 90% ostial stenosis 2. Moderate LV dysfunction with EF 35-40%. Akinesis of the basal to mid inferior wall 3. Normal LVEDP - Plan: medical therapy.  Echo 08/28/16:  - Left ventricle: The cavity size was mildly dilated. Wall thickness was increased in a pattern of severe LVH. Systolic function was moderately reduced. The estimated ejection fraction was in the range of 35% to 40%. Akinesis of the basalinferior and apical myocardium. Features are consistent with a pseudonormal left ventricular filling pattern, with concomitant abnormal relaxation and increased filling pressure (grade 2 diastolic dysfunction). - Aortic valve: There was mild regurgitation. - Mitral valve: There was mild regurgitation. - Left atrium: The atrium was severely dilated.   Perioperative prescription form for ICD documents procedure may interfere with device function.  Magnet should be placed over device during procedure.  Postop interrogation not needed.    Past Medical History:  Diagnosis Date  . 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 Arthritis    "maybe in his spine" (09/05/2016)  . Asthma    pt. denies  . Cardiac arrest ([place]) 08/27/2016   REQUIRING CPR, SHOCK & MEDICATIONS  . CHF (congestive heart failure) ([place])   . Coronary artery disease    90% ostRCA stenosis and total occlusion of dRCA with L-->R collaterals 2017  .  Dysrhythmia   . Gout   . High cholesterol   . Hypertension   . Ischemic cardiomyopathy   . LBBB (left bundle branch block)   . OSA on CPAP   . Type II diabetes mellitus ([place])     Past Surgical History:  Procedure Laterality Date  . CARDIAC CATHETERIZATION N/A 09/07/2016   Procedure: Left Heart Cath and Coronary Angiography;  Surgeon: Peter M Martinique, MD;  Location: [place] [facility];  Service: Cardiovascular;  Laterality: N/A;  . EP IMPLANTABLE DEVICE N/A 10/10/2016   Procedure: ICD Implant;  Surgeon: Will Meredith Leeds, MD;   Location: [place] [facility];  Service: Cardiovascular;  Laterality: N/A;    MEDICATIONS: . allopurinol (ZYLOPRIM) 300 MG tablet  . amiodarone (PACERONE) 200 MG tablet  . aspirin EC 81 MG EC tablet  . atorvastatin (LIPITOR) 40 MG tablet  . carvedilol (COREG) 25 MG tablet  . CVS VITAMIN D3 1000 units capsule  . furosemide (LASIX) 40 MG tablet  . KLOR-CON M20 20 MEQ tablet  . losartan (COZAAR) 100 MG tablet  . magnesium oxide (MAG-OX) 400 MG tablet  . metFORMIN (GLUCOPHAGE) 500 MG tablet   No current facility-administered medications for this encounter.     If no changes, I anticipate pt can proceed with surgery as scheduled.   Willeen Cass, FNP-BC [facility]/Anesthesiology Phone: [phone] 01/14/2018 2:05 PM

## 2018-01-14 NOTE — Telephone Encounter (Signed)
Patient called to find out if he needs to stop taking any of his medications before his surgery on Friday. He said someone from [facility] called to leave a voicemail but hasnt heard anything back. Please let patient know ASAP [phone]

## 2018-01-14 NOTE — Telephone Encounter (Signed)
Debbie, can you get this patient in touch with [facility] so he will know what to do with meds? Thanks.

## 2018-01-15 ENCOUNTER — Encounter (HOSPITAL_COMMUNITY): Payer: Self-pay

## 2018-01-15 NOTE — Telephone Encounter (Signed)
Please advise 

## 2018-01-15 NOTE — Telephone Encounter (Signed)
Talked with patient and advised him of message per Dr. Lorin Mercy.

## 2018-01-15 NOTE — Telephone Encounter (Signed)
UCALL. Ok TO CONTINUE ASPIRIN 81MG  THANKS

## 2018-01-15 NOTE — Telephone Encounter (Signed)
Patient would like instructions on the Aspirin 81mg .  He is scheduled for L-spine surgery this Friday January 18, 2018. Both medical and cardiac clearances were obtained, but no instructions on aspirin therapy were included. Patient completed H&P with Jadene Pierini  on 01/09/18.   Pt's cb  [phone]

## 2018-01-17 ENCOUNTER — Encounter (HOSPITAL_COMMUNITY): Payer: Self-pay | Admitting: Anesthesiology

## 2018-01-17 NOTE — Anesthesia Preprocedure Evaluation (Addendum)
Anesthesia Evaluation  Patient identified by MRN, date of birth, ID band Patient awake    Reviewed: Allergy & Precautions, NPO status   Airway Mallampati: II       Dental no notable dental hx. (+) Teeth Intact   Pulmonary    Pulmonary exam normal breath sounds clear to auscultation       Cardiovascular hypertension, Pt. on medications + CAD and +CHF  Normal cardiovascular exam+ Cardiac Defibrillator  Rhythm:Regular Rate:Normal     Neuro/Psych    GI/Hepatic   Endo/Other  diabetes, Type 2, Oral Hypoglycemic AgentsMorbid obesity  Renal/GU      Musculoskeletal   Abdominal (+) + obese,   Peds  Hematology   Anesthesia Other Findings   Reproductive/Obstetrics                            Anesthesia Physical Anesthesia Plan  ASA: III  Anesthesia Plan: General   Post-op Pain Management:    Induction: Intravenous  PONV Risk Score and Plan: 3  Airway Management Planned: Oral ETT  Additional Equipment:   Intra-op Plan:   Post-operative Plan: Extubation in OR  Informed Consent: I have reviewed the patients History and Physical, chart, labs and discussed the procedure including the risks, benefits and alternatives for the proposed anesthesia with the patient or authorized representative who has indicated his/her understanding and acceptance.   Dental advisory given  Plan Discussed with: CRNA and Surgeon  Anesthesia Plan Comments:        Anesthesia Quick Evaluation

## 2018-01-18 ENCOUNTER — Encounter (HOSPITAL_COMMUNITY): Payer: Self-pay

## 2018-01-18 ENCOUNTER — Encounter (HOSPITAL_COMMUNITY)
Admission: AD | Disposition: A | Discharge status: Home Health | Payer: Self-pay | Source: Ambulatory Visit | Attending: Orthopaedic Surgery

## 2018-01-18 ENCOUNTER — Inpatient Hospital Stay (HOSPITAL_COMMUNITY): Payer: Medicare HMO

## 2018-01-18 ENCOUNTER — Inpatient Hospital Stay (HOSPITAL_COMMUNITY)
Admission: AD | Admit: 2018-01-18 | Discharge: 2018-01-21 | DRG: 519 | Disposition: A | Discharge status: Home Health | Payer: Medicare HMO | Source: Ambulatory Visit | Attending: Orthopaedic Surgery | Admitting: Orthopaedic Surgery

## 2018-01-18 ENCOUNTER — Inpatient Hospital Stay (HOSPITAL_COMMUNITY): Payer: Medicare HMO | Admitting: Anesthesiology

## 2018-01-18 ENCOUNTER — Inpatient Hospital Stay (HOSPITAL_COMMUNITY): Payer: Medicare HMO | Admitting: Vascular Surgery

## 2018-01-18 DIAGNOSIS — Z95810 Presence of automatic (implantable) cardiac defibrillator: Secondary | ICD-10-CM | POA: Diagnosis not present

## 2018-01-18 DIAGNOSIS — Z8674 Personal history of sudden cardiac arrest: Secondary | ICD-10-CM

## 2018-01-18 DIAGNOSIS — Z79899 Other long term (current) drug therapy: Secondary | ICD-10-CM

## 2018-01-18 DIAGNOSIS — I2510 Atherosclerotic heart disease of native coronary artery without angina pectoris: Secondary | ICD-10-CM | POA: Diagnosis present

## 2018-01-18 DIAGNOSIS — Z91041 Radiographic dye allergy status: Secondary | ICD-10-CM | POA: Diagnosis not present

## 2018-01-18 DIAGNOSIS — Z419 Encounter for procedure for purposes other than remedying health state, unspecified: Secondary | ICD-10-CM

## 2018-01-18 DIAGNOSIS — E7800 Pure hypercholesterolemia, unspecified: Secondary | ICD-10-CM | POA: Diagnosis present

## 2018-01-18 DIAGNOSIS — I509 Heart failure, unspecified: Secondary | ICD-10-CM | POA: Diagnosis present

## 2018-01-18 DIAGNOSIS — E119 Type 2 diabetes mellitus without complications: Secondary | ICD-10-CM | POA: Diagnosis present

## 2018-01-18 DIAGNOSIS — Z7984 Long term (current) use of oral hypoglycemic drugs: Secondary | ICD-10-CM | POA: Diagnosis not present

## 2018-01-18 DIAGNOSIS — M48061 Spinal stenosis, lumbar region without neurogenic claudication: Secondary | ICD-10-CM | POA: Diagnosis present

## 2018-01-18 DIAGNOSIS — Z7982 Long term (current) use of aspirin: Secondary | ICD-10-CM

## 2018-01-18 DIAGNOSIS — I255 Ischemic cardiomyopathy: Secondary | ICD-10-CM | POA: Diagnosis present

## 2018-01-18 DIAGNOSIS — I110 Hypertensive heart disease with heart failure: Secondary | ICD-10-CM | POA: Diagnosis present

## 2018-01-18 DIAGNOSIS — M109 Gout, unspecified: Secondary | ICD-10-CM | POA: Diagnosis present

## 2018-01-18 DIAGNOSIS — M4807 Spinal stenosis, lumbosacral region: Secondary | ICD-10-CM

## 2018-01-18 DIAGNOSIS — M48062 Spinal stenosis, lumbar region with neurogenic claudication: Secondary | ICD-10-CM | POA: Diagnosis present

## 2018-01-18 DIAGNOSIS — Z6841 Body Mass Index (BMI) 40.0 and over, adult: Secondary | ICD-10-CM

## 2018-01-18 DIAGNOSIS — G4733 Obstructive sleep apnea (adult) (pediatric): Secondary | ICD-10-CM | POA: Diagnosis present

## 2018-01-18 DIAGNOSIS — Z888 Allergy status to other drugs, medicaments and biological substances status: Secondary | ICD-10-CM

## 2018-01-18 HISTORY — PX: LUMBAR LAMINECTOMY/DECOMPRESSION MICRODISCECTOMY: SHX5026

## 2018-01-18 LAB — GLUCOSE, CAPILLARY
Glucose-Capillary: 107 mg/dL — ABNORMAL HIGH (ref 65–99)
Glucose-Capillary: 104 mg/dL — ABNORMAL HIGH (ref 65–99)
Glucose-Capillary: 94 mg/dL (ref 65–99)

## 2018-01-18 SURGERY — LUMBAR LAMINECTOMY/DECOMPRESSION MICRODISCECTOMY
Anesthesia: General

## 2018-01-18 MED ORDER — SUCCINYLCHOLINE CHLORIDE 20 MG/ML IJ SOLN
INTRAMUSCULAR | Status: DC | PRN
  Administered 2018-01-18: 140 mg via INTRAVENOUS

## 2018-01-18 MED ORDER — MEPERIDINE HCL 50 MG/ML IJ SOLN: 6.2500 mg | INTRAMUSCULAR | Status: DC | PRN

## 2018-01-18 MED ORDER — METFORMIN HCL 500 MG PO TABS
500.0000 mg | ORAL_TABLET | Freq: Two times a day (BID) | ORAL | Status: DC
  Administered 2018-01-18 – 2018-01-21 (×6): 500 mg via ORAL
  Filled 2018-01-18 (×6): qty 1

## 2018-01-18 MED ORDER — CEFAZOLIN SODIUM-DEXTROSE 1-4 GM/50ML-% IV SOLN
1.0000 g | Freq: Three times a day (TID) | INTRAVENOUS | Status: DC
  Filled 2018-01-18: qty 50

## 2018-01-18 MED ORDER — BUPIVACAINE HCL (PF) 0.25 % IJ SOLN
INTRAMUSCULAR | Status: AC
  Filled 2018-01-18: qty 30

## 2018-01-18 MED ORDER — ROCURONIUM BROMIDE 10 MG/ML (PF) SYRINGE
PREFILLED_SYRINGE | INTRAVENOUS | Status: AC
  Filled 2018-01-18: qty 5

## 2018-01-18 MED ORDER — METHOCARBAMOL 500 MG PO TABS
500.0000 mg | ORAL_TABLET | Freq: Four times a day (QID) | ORAL | Status: DC | PRN
  Administered 2018-01-18 – 2018-01-21 (×4): 500 mg via ORAL
  Filled 2018-01-18 (×3): qty 1

## 2018-01-18 MED ORDER — CEFAZOLIN SODIUM-DEXTROSE 1-4 GM/50ML-% IV SOLN
1.0000 g | Freq: Three times a day (TID) | INTRAVENOUS | Status: AC
  Administered 2018-01-18 – 2018-01-19 (×2): 1 g via INTRAVENOUS
  Filled 2018-01-18 (×2): qty 50

## 2018-01-18 MED ORDER — ONDANSETRON HCL 4 MG PO TABS: 4.0000 mg | ORAL_TABLET | Freq: Four times a day (QID) | ORAL | Status: DC | PRN

## 2018-01-18 MED ORDER — CHLORHEXIDINE GLUCONATE 4 % EX LIQD: 60.0000 mL | Freq: Once | CUTANEOUS | Status: DC

## 2018-01-18 MED ORDER — OXYCODONE HCL 5 MG PO TABS
ORAL_TABLET | ORAL | Status: AC
  Filled 2018-01-18: qty 1

## 2018-01-18 MED ORDER — HYDROMORPHONE HCL 2 MG/ML IJ SOLN
0.5000 mg | INTRAMUSCULAR | Status: DC | PRN
  Administered 2018-01-18 – 2018-01-20 (×7): 0.5 mg via INTRAVENOUS
  Filled 2018-01-18 (×8): qty 1

## 2018-01-18 MED ORDER — MENTHOL 3 MG MT LOZG: 1.0000 | LOZENGE | OROMUCOSAL | Status: DC | PRN

## 2018-01-18 MED ORDER — SODIUM CHLORIDE 0.9 % IV SOLN: 250.0000 mL | INTRAVENOUS | Status: DC

## 2018-01-18 MED ORDER — POTASSIUM CHLORIDE CRYS ER 20 MEQ PO TBCR
20.0000 meq | EXTENDED_RELEASE_TABLET | Freq: Every day | ORAL | Status: DC
  Administered 2018-01-19 – 2018-01-21 (×3): 20 meq via ORAL
  Filled 2018-01-18 (×4): qty 1

## 2018-01-18 MED ORDER — DEXTROSE 5 % IV SOLN
3.0000 g | INTRAVENOUS | Status: AC
  Administered 2018-01-18 (×2): 3 g via INTRAVENOUS
  Filled 2018-01-18: qty 3

## 2018-01-18 MED ORDER — PROMETHAZINE HCL 25 MG/ML IJ SOLN: 6.2500 mg | INTRAMUSCULAR | Status: DC | PRN

## 2018-01-18 MED ORDER — OXYCODONE HCL 5 MG PO TABS
5.0000 mg | ORAL_TABLET | ORAL | Status: DC | PRN
  Administered 2018-01-18 – 2018-01-21 (×5): 5 mg via ORAL
  Filled 2018-01-18 (×5): qty 1

## 2018-01-18 MED ORDER — LACTATED RINGERS IV SOLN
INTRAVENOUS | Status: DC | PRN
  Administered 2018-01-18 (×2): via INTRAVENOUS

## 2018-01-18 MED ORDER — EPHEDRINE SULFATE-NACL 50-0.9 MG/10ML-% IV SOSY
PREFILLED_SYRINGE | INTRAVENOUS | Status: DC | PRN
  Administered 2018-01-18 (×2): 5 mg via INTRAVENOUS

## 2018-01-18 MED ORDER — FUROSEMIDE 40 MG PO TABS
40.0000 mg | ORAL_TABLET | Freq: Two times a day (BID) | ORAL | Status: DC
  Administered 2018-01-18 – 2018-01-21 (×6): 40 mg via ORAL
  Filled 2018-01-18 (×6): qty 1

## 2018-01-18 MED ORDER — SODIUM CHLORIDE 0.9% FLUSH: 3.0000 mL | INTRAVENOUS | Status: DC | PRN

## 2018-01-18 MED ORDER — CARVEDILOL 25 MG PO TABS
25.0000 mg | ORAL_TABLET | Freq: Two times a day (BID) | ORAL | Status: DC
  Administered 2018-01-18 – 2018-01-21 (×6): 25 mg via ORAL
  Filled 2018-01-18 (×6): qty 1

## 2018-01-18 MED ORDER — ATORVASTATIN CALCIUM 40 MG PO TABS
40.0000 mg | ORAL_TABLET | Freq: Every day | ORAL | Status: DC
  Administered 2018-01-18 – 2018-01-20 (×3): 40 mg via ORAL
  Filled 2018-01-18 (×3): qty 1

## 2018-01-18 MED ORDER — SODIUM CHLORIDE 0.9 % IV SOLN
INTRAVENOUS | Status: DC | PRN
  Administered 2018-01-18: 10:00:00 via INTRAVENOUS

## 2018-01-18 MED ORDER — DEXTROSE 5 % IV SOLN
3.0000 g | INTRAVENOUS | Status: DC
  Filled 2018-01-18: qty 3000

## 2018-01-18 MED ORDER — BUPIVACAINE HCL (PF) 0.25 % IJ SOLN
INTRAMUSCULAR | Status: DC | PRN
  Administered 2018-01-18: 10 mL

## 2018-01-18 MED ORDER — PHENOL 1.4 % MT LIQD: 1.0000 | OROMUCOSAL | Status: DC | PRN

## 2018-01-18 MED ORDER — KETOROLAC TROMETHAMINE 30 MG/ML IJ SOLN
30.0000 mg | Freq: Once | INTRAMUSCULAR | Status: AC | PRN
  Administered 2018-01-18: 30 mg via INTRAVENOUS

## 2018-01-18 MED ORDER — 0.9 % SODIUM CHLORIDE (POUR BTL) OPTIME
TOPICAL | Status: DC | PRN
  Administered 2018-01-18: 1000 mL

## 2018-01-18 MED ORDER — PHENYLEPHRINE 40 MCG/ML (10ML) SYRINGE FOR IV PUSH (FOR BLOOD PRESSURE SUPPORT)
PREFILLED_SYRINGE | INTRAVENOUS | Status: AC
  Filled 2018-01-18: qty 10

## 2018-01-18 MED ORDER — ONDANSETRON HCL 4 MG/2ML IJ SOLN
4.0000 mg | Freq: Four times a day (QID) | INTRAMUSCULAR | Status: DC | PRN
  Administered 2018-01-18: 4 mg via INTRAVENOUS
  Filled 2018-01-18: qty 2

## 2018-01-18 MED ORDER — SODIUM CHLORIDE 0.9% FLUSH
3.0000 mL | Freq: Two times a day (BID) | INTRAVENOUS | Status: DC
  Administered 2018-01-18 – 2018-01-21 (×4): 3 mL via INTRAVENOUS

## 2018-01-18 MED ORDER — MAGNESIUM OXIDE 400 (241.3 MG) MG PO TABS
400.0000 mg | ORAL_TABLET | Freq: Two times a day (BID) | ORAL | Status: DC
  Administered 2018-01-18 – 2018-01-21 (×7): 400 mg via ORAL
  Filled 2018-01-18 (×7): qty 1

## 2018-01-18 MED ORDER — POLYETHYLENE GLYCOL 3350 17 G PO PACK
17.0000 g | PACK | Freq: Every day | ORAL | Status: DC | PRN
  Administered 2018-01-21: 17 g via ORAL
  Filled 2018-01-18: qty 1

## 2018-01-18 MED ORDER — PHENYLEPHRINE HCL 10 MG/ML IJ SOLN
INTRAVENOUS | Status: DC | PRN
  Administered 2018-01-18: 100 ug/min via INTRAVENOUS

## 2018-01-18 MED ORDER — PROPOFOL 10 MG/ML IV BOLUS
INTRAVENOUS | Status: DC | PRN
  Administered 2018-01-18: 15 mg via INTRAVENOUS

## 2018-01-18 MED ORDER — ACETAMINOPHEN 325 MG PO TABS
650.0000 mg | ORAL_TABLET | ORAL | Status: DC | PRN
  Administered 2018-01-18 – 2018-01-20 (×2): 650 mg via ORAL
  Filled 2018-01-18 (×2): qty 2

## 2018-01-18 MED ORDER — FENTANYL CITRATE (PF) 100 MCG/2ML IJ SOLN
INTRAMUSCULAR | Status: DC | PRN
  Administered 2018-01-18: 150 ug via INTRAVENOUS
  Administered 2018-01-18: 50 ug via INTRAVENOUS

## 2018-01-18 MED ORDER — SODIUM CHLORIDE 0.9 % IV SOLN: INTRAVENOUS | Status: DC

## 2018-01-18 MED ORDER — ACETAMINOPHEN 650 MG RE SUPP: 650.0000 mg | RECTAL | Status: DC | PRN

## 2018-01-18 MED ORDER — FENTANYL CITRATE (PF) 250 MCG/5ML IJ SOLN
INTRAMUSCULAR | Status: AC
  Filled 2018-01-18: qty 5

## 2018-01-18 MED ORDER — KETOROLAC TROMETHAMINE 30 MG/ML IJ SOLN
INTRAMUSCULAR | Status: AC
  Filled 2018-01-18: qty 1

## 2018-01-18 MED ORDER — METHOCARBAMOL 1000 MG/10ML IJ SOLN
500.0000 mg | Freq: Four times a day (QID) | INTRAMUSCULAR | Status: DC | PRN
  Filled 2018-01-18: qty 5

## 2018-01-18 MED ORDER — MIDAZOLAM HCL 2 MG/2ML IJ SOLN
INTRAMUSCULAR | Status: AC
  Filled 2018-01-18: qty 2

## 2018-01-18 MED ORDER — PROPOFOL 10 MG/ML IV BOLUS
INTRAVENOUS | Status: AC
  Filled 2018-01-18: qty 40

## 2018-01-18 MED ORDER — PHENYLEPHRINE HCL 10 MG/ML IJ SOLN
INTRAMUSCULAR | Status: DC | PRN
  Administered 2018-01-18 (×2): 100 ug via INTRAVENOUS

## 2018-01-18 MED ORDER — MORPHINE SULFATE (PF) 4 MG/ML IV SOLN
INTRAVENOUS | Status: AC
  Filled 2018-01-18: qty 1

## 2018-01-18 MED ORDER — LOSARTAN POTASSIUM 50 MG PO TABS
100.0000 mg | ORAL_TABLET | Freq: Every day | ORAL | Status: DC
  Administered 2018-01-18 – 2018-01-21 (×4): 100 mg via ORAL
  Filled 2018-01-18 (×4): qty 2

## 2018-01-18 MED ORDER — METHOCARBAMOL 500 MG PO TABS
ORAL_TABLET | ORAL | Status: AC
  Filled 2018-01-18: qty 1

## 2018-01-18 MED ORDER — LIDOCAINE HCL (CARDIAC) PF 100 MG/5ML IV SOSY
PREFILLED_SYRINGE | INTRAVENOUS | Status: DC | PRN
  Administered 2018-01-18: 50 mg via INTRAVENOUS

## 2018-01-18 MED ORDER — SUGAMMADEX SODIUM 500 MG/5ML IV SOLN
INTRAVENOUS | Status: DC | PRN
  Administered 2018-01-18: 252.2 mg via INTRAVENOUS

## 2018-01-18 MED ORDER — DOCUSATE SODIUM 100 MG PO CAPS
100.0000 mg | ORAL_CAPSULE | Freq: Two times a day (BID) | ORAL | Status: DC
  Administered 2018-01-18 – 2018-01-21 (×6): 100 mg via ORAL
  Filled 2018-01-18 (×7): qty 1

## 2018-01-18 MED ORDER — THROMBIN (RECOMBINANT) 20000 UNITS EX SOLR
CUTANEOUS | Status: AC
  Filled 2018-01-18: qty 20000

## 2018-01-18 MED ORDER — GLYCOPYRROLATE 0.2 MG/ML IJ SOLN
INTRAMUSCULAR | Status: DC | PRN
  Administered 2018-01-18: 0.2 mg via INTRAVENOUS

## 2018-01-18 MED ORDER — ALBUMIN HUMAN 5 % IV SOLN
INTRAVENOUS | Status: DC | PRN
  Administered 2018-01-18: 08:00:00 via INTRAVENOUS

## 2018-01-18 MED ORDER — THROMBIN (RECOMBINANT) 20000 UNITS EX SOLR
OROMUCOSAL | Status: DC | PRN
  Administered 2018-01-18: 20 mL via TOPICAL

## 2018-01-18 MED ORDER — SUCCINYLCHOLINE CHLORIDE 200 MG/10ML IV SOSY
PREFILLED_SYRINGE | INTRAVENOUS | Status: AC
  Filled 2018-01-18: qty 10

## 2018-01-18 MED ORDER — MORPHINE SULFATE (PF) 4 MG/ML IV SOLN
1.0000 mg | INTRAVENOUS | Status: DC | PRN
  Administered 2018-01-18 (×2): 2 mg via INTRAVENOUS

## 2018-01-18 MED ORDER — ALLOPURINOL 300 MG PO TABS
300.0000 mg | ORAL_TABLET | Freq: Every day | ORAL | Status: DC
  Administered 2018-01-18 – 2018-01-21 (×4): 300 mg via ORAL
  Filled 2018-01-18 (×4): qty 1

## 2018-01-18 MED ORDER — HEMOSTATIC AGENTS (NO CHARGE) OPTIME
TOPICAL | Status: DC | PRN
  Administered 2018-01-18 (×3): 1 via TOPICAL

## 2018-01-18 MED ORDER — ONDANSETRON HCL 4 MG/2ML IJ SOLN
INTRAMUSCULAR | Status: AC
  Filled 2018-01-18: qty 2

## 2018-01-18 MED ORDER — SUGAMMADEX SODIUM 500 MG/5ML IV SOLN
INTRAVENOUS | Status: AC
  Filled 2018-01-18: qty 5

## 2018-01-18 MED ORDER — AMIODARONE HCL 100 MG PO TABS
200.0000 mg | ORAL_TABLET | Freq: Every day | ORAL | Status: DC
  Administered 2018-01-18 – 2018-01-21 (×4): 200 mg via ORAL
  Filled 2018-01-18 (×4): qty 2

## 2018-01-18 MED ORDER — ONDANSETRON HCL 4 MG/2ML IJ SOLN
INTRAMUSCULAR | Status: DC | PRN
  Administered 2018-01-18: 4 mg via INTRAVENOUS

## 2018-01-18 MED ORDER — LIDOCAINE 2% (20 MG/ML) 5 ML SYRINGE
INTRAMUSCULAR | Status: AC
  Filled 2018-01-18: qty 5

## 2018-01-18 SURGICAL SUPPLY — 52 items
APL SRG 60D 8 XTD TIP BNDBL (TIP) ×1
BUR ROUND FLUTED 4 SOFT TCH (BURR) ×1 IMPLANT
CANISTER SUCT 3000ML PPV (MISCELLANEOUS) ×2 IMPLANT
CLSR STERI-STRIP ANTIMIC 1/2X4 (GAUZE/BANDAGES/DRESSINGS) ×2 IMPLANT
COVER SURGICAL LIGHT HANDLE (MISCELLANEOUS) ×2 IMPLANT
DECANTER SPIKE VIAL GLASS SM (MISCELLANEOUS) ×1 IMPLANT
DRAPE HALF SHEET 40X57 (DRAPES) ×4 IMPLANT
DRAPE MICROSCOPE LEICA (MISCELLANEOUS) ×2 IMPLANT
DRAPE SURG 17X23 STRL (DRAPES) ×2 IMPLANT
DRSG MEPILEX BORDER 4X4 (GAUZE/BANDAGES/DRESSINGS) ×2 IMPLANT
DRSG MEPILEX BORDER 4X8 (GAUZE/BANDAGES/DRESSINGS) ×1 IMPLANT
DURAPREP 26ML APPLICATOR (WOUND CARE) ×2 IMPLANT
DURASEAL APPLICATOR TIP (TIP) ×1 IMPLANT
DURASEAL SPINE SEALANT 5 POLY (MISCELLANEOUS) ×1 IMPLANT
ELECT REM PT RETURN 9FT ADLT (ELECTROSURGICAL) ×2
ELECTRODE REM PT RTRN 9FT ADLT (ELECTROSURGICAL) ×1 IMPLANT
FLOSEAL (HEMOSTASIS) ×3 IMPLANT
GLOVE BIO SURGEON STRL SZ7 (GLOVE) ×3 IMPLANT
GLOVE BIOGEL PI IND STRL 7.0 (GLOVE) IMPLANT
GLOVE BIOGEL PI IND STRL 8 (GLOVE) ×2 IMPLANT
GLOVE BIOGEL PI INDICATOR 7.0 (GLOVE) ×4
GLOVE BIOGEL PI INDICATOR 8 (GLOVE) ×2
GLOVE ORTHO TXT STRL SZ7.5 (GLOVE) ×4 IMPLANT
GLOVE SURG SS PI 7.0 STRL IVOR (GLOVE) ×1 IMPLANT
GOWN STRL REUS W/ TWL LRG LVL3 (GOWN DISPOSABLE) ×2 IMPLANT
GOWN STRL REUS W/ TWL XL LVL3 (GOWN DISPOSABLE) ×1 IMPLANT
GOWN STRL REUS W/TWL 2XL LVL3 (GOWN DISPOSABLE) ×2 IMPLANT
GOWN STRL REUS W/TWL LRG LVL3 (GOWN DISPOSABLE) ×4
GOWN STRL REUS W/TWL XL LVL3 (GOWN DISPOSABLE) ×2
KIT BASIN OR (CUSTOM PROCEDURE TRAY) ×2 IMPLANT
KIT TURNOVER KIT B (KITS) ×2 IMPLANT
MANIFOLD NEPTUNE II (INSTRUMENTS) ×1 IMPLANT
NDL HYPO 25GX1X1/2 BEV (NEEDLE) ×1 IMPLANT
NDL SPNL 18GX3.5 QUINCKE PK (NEEDLE) ×1 IMPLANT
NEEDLE HYPO 25GX1X1/2 BEV (NEEDLE) ×2 IMPLANT
NEEDLE SPNL 18GX3.5 QUINCKE PK (NEEDLE) ×4 IMPLANT
NS IRRIG 1000ML POUR BTL (IV SOLUTION) ×2 IMPLANT
PACK LAMINECTOMY ORTHO (CUSTOM PROCEDURE TRAY) ×2 IMPLANT
PAD ARMBOARD 7.5X6 YLW CONV (MISCELLANEOUS) ×6 IMPLANT
PATTIES SURGICAL .5 X.5 (GAUZE/BANDAGES/DRESSINGS) ×1 IMPLANT
PATTIES SURGICAL .75X.75 (GAUZE/BANDAGES/DRESSINGS) ×1 IMPLANT
STRIP CLOSURE SKIN 1/2X4 (GAUZE/BANDAGES/DRESSINGS) ×1 IMPLANT
SUT NURALON 4 0 TR CR/8 (SUTURE) ×1 IMPLANT
SUT VIC AB 0 CT1 27 (SUTURE)
SUT VIC AB 0 CT1 27XBRD ANBCTR (SUTURE) IMPLANT
SUT VIC AB 1 CTX 36 (SUTURE) ×2
SUT VIC AB 1 CTX36XBRD ANBCTR (SUTURE) ×1 IMPLANT
SUT VIC AB 2-0 CT1 27 (SUTURE) ×2
SUT VIC AB 2-0 CT1 TAPERPNT 27 (SUTURE) ×1 IMPLANT
SUT VIC AB 3-0 X1 27 (SUTURE) ×2 IMPLANT
TOWEL OR 17X24 6PK STRL BLUE (TOWEL DISPOSABLE) ×2 IMPLANT
TOWEL OR 17X26 10 PK STRL BLUE (TOWEL DISPOSABLE) ×2 IMPLANT

## 2018-01-18 NOTE — Anesthesia Postprocedure Evaluation (Signed)
Anesthesia Post Note  Patient: Dean [name]  Procedure(s) Performed: L2-3, L3-4, L-4-5 CENTRAL DECOMPRESSION (N/A )     Patient location during evaluation: PACU Anesthesia Type: General Level of consciousness: awake Pain management: pain level controlled Vital Signs Assessment: post-procedure vital signs reviewed and stable Respiratory status: spontaneous breathing Cardiovascular status: stable Postop Assessment: no apparent nausea or vomiting Anesthetic complications: no    Last Vitals:  Vitals:   01/18/18 1315 01/18/18 1330  BP: (!) 146/44 (!) 134/49  Pulse: 68 67  Resp: 19 18  Temp:    SpO2: 100% 100%    Last Pain:  Vitals:   01/18/18 0637  PainSc: 0-No pain   Pain Goal:    LLE Motor Response: Responds to commands (01/18/18 1330) LLE Sensation: Full sensation (01/18/18 1330) RLE Motor Response: Responds to commands (01/18/18 1330) RLE Sensation: Full sensation (01/18/18 1330)      [place]

## 2018-01-18 NOTE — Anesthesia Procedure Notes (Signed)
Procedure Name: Intubation Date/Time: 01/18/2018 7:52 AM Performed by: Eligha Bridegroom, CRNA Pre-anesthesia Checklist: Patient identified, Emergency Drugs available, Suction available, Patient being monitored and Timeout performed Patient Re-evaluated:Patient Re-evaluated prior to induction Oxygen Delivery Method: Circle system utilized Preoxygenation: Pre-oxygenation with 100% oxygen Induction Type: IV induction Ventilation: Mask ventilation without difficulty and Oral airway inserted - appropriate to patient size Laryngoscope Size: Glidescope and 4 Grade View: Grade I Tube type: Oral Tube size: 7.5 mm Number of attempts: 1 Airway Equipment and Method: Stylet and Video-laryngoscopy Placement Confirmation: ETT inserted through vocal cords under direct vision,  positive ETCO2 and breath sounds checked- equal and bilateral Secured at: 22 cm Tube secured with: Tape Dental Injury: Teeth and Oropharynx as per pre-operative assessment

## 2018-01-18 NOTE — Interval H&P Note (Signed)
History and Physical Interval Note:  01/18/2018 7:25 AM  Dean [name]  has presented today for surgery, with the diagnosis of L-2-3, L3-4, L4-5, SPINAL STENOSIS  The various methods of treatment have been discussed with the patient and family. After consideration of risks, benefits and other options for treatment, the patient has consented to  Procedure(s): L2-3, L3-4, L-4-5 CENTRAL DECOMPRESSION (N/A) as a surgical intervention .  The patient's history has been reviewed, patient examined, no change in status, stable for surgery.  I have reviewed the patient's chart and labs.  Questions were answered to the patient's satisfaction.     [name]

## 2018-01-18 NOTE — Progress Notes (Signed)
   Subjective: Day of Surgery Procedure(s) (LRB): L2-3, L3-4, L-4-5 CENTRAL DECOMPRESSION (N/A) Patient reports pain as mild incisional pain.  Patient denies headache.  Good sensation lower extremities.  Patient is at bedrest flat with Foley catheter in place.  Objective: Vital signs in last 24 hours: Temp:  [97.7 F (36.5 C)-98.7 F (37.1 C)] 97.7 F (36.5 C) (04/26 1551) Pulse Rate:  [63-72] 67 (04/26 1551) Resp:  [16-20] 16 (04/26 1551) BP: (120-146)/(44-60) 141/57 (04/26 1551) SpO2:  [94 %-100 %] 94 % (04/26 1551) Weight:  [278 lb (126.1 kg)] 278 lb (126.1 kg) (04/26 0602)  Intake/Output from previous day: No intake/output data recorded. Intake/Output this shift: No intake/output data recorded.  No results for input(s): HGB in the last 72 hours. No results for input(s): WBC, RBC, HCT, PLT in the last 72 hours. No results for input(s): NA, K, CL, CO2, BUN, CREATININE, GLUCOSE, CALCIUM in the last 72 hours. No results for input(s): LABPT, INR in the last 72 hours.  Neurologically intact, quads, hamstrings, ankle dorsiflexion plantarflexion peroneals anterior tib foot sensation leg sensation is intact.  Normal perianal and genital sensation. Dg Lumbar Spine 2-3 Views  Result Date: 01/18/2018 CLINICAL DATA:  L2-3, L3-4, L4-5 central decompression EXAM: LUMBAR SPINE - 2-3 VIEW COMPARISON:  11/15/2017 MRI FINDINGS: 1st image demonstrates posterior needle is directed at the L5 pedicles in the L4-5 interspace and the L1-2 disc space. Second lateral intraoperative image demonstrates posterior surgical instruments directed at L5-S1 and L2-3. Third lateral intraoperative image demonstrates posterior surgical instruments directed at L5-S1 and L2-3. IMPRESSION: Intraoperative localization as above. Electronically Signed   By: Rolm Baptise M.D.   On: 01/18/2018 10:47    Assessment/Plan: Day of Surgery Procedure(s) (LRB): L2-3, L3-4, L-4-5 CENTRAL DECOMPRESSION (N/A) Plan: Bedrest flat for  at least 24 hours.  I discussed with the patient as well as his 2 daughters once again the decompression procedure as well as dural repair which was watertight.  Patient is neurologically intact and will remain at bedrest for at least 24 hours.  [name] 01/18/2018, 7:24 PM

## 2018-01-18 NOTE — Op Note (Signed)
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  Postop diagnosis same  Procedure: Multilevel lumbar decompression L2-L5 for 3 level lumbar spinal stenosis.  Dural repair at L4-5.  Surgeon: Rodell Perna, MD  Assistant: Benjiman Core, PA-C medically necessary and present for the entire procedure  Anesthesia: General plus local.  Complication: 8 mm dorsal dural tear repaired with 4-0 Nurolon and DuraSeal watertight closure.   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  Preoperative consent was obtained and risks was discussed in detail with his history of implantable defibrillator, diabetes, hypertension and morbid obesity.  Patient and family elected to proceed with operative intervention to allow him to be more mobile and give him relief of his pain that only allowed him to stand 4 to 5 minutes before he had to sit down and ambulation distance of 20 feet.  Operative procedure: After induction of general anesthesia orotracheal intubation placement in the prone position on chest rolls to straps were applied for stabilization on the thigh and thoracic region.  1015 drape was applied shaver was used the clips and body hair at the lumbar region.  1015 drape was placed at S1 level.  DuraPrep area squared with towels Betadine Steri-Drape and laminectomy sheets and drapes were applied.  Timeout procedure was completed 3 g Ancef was given prophylactically.  Midline incision was made and the possible column retractor had to be used due to extra-depth down to the lamina level.  Spinal needle was placed before the incision with crosstable lateral x-ray for localization.  Once  this was performed we placed Kocher clamps on the spinous process second x-ray was taken and then after decompression procedure was taken near the end of the case third x-ray was taken to confirm decompression had been performed above the L2-3 level and down to the mid body of L5 since the area of stenosis extended to the top portion of the lamina L5.  Patient had overhanging spurs from the facet joints and hypertrophic ligamentum that was partially calcified.  Patient had thin dura and the most severe level L4-5 was surgically addressed first.  Patties were used to protect the dura and thrombin and Gelfoam as well as Surgi-Flo was used meticulously to try to decrease bleeding in the epidural space.  Osteotome was used for partial removal of the medial portion of the facet and central decompression was performed.  [place] and [employer] and Shella Maxim was used to protect the dura as well as patties and despite this portion of the dura rolled up with some of the ligamentum was being removed with a 3 mm Kerrison and an 8 mm dorsal midline dural tear occurred at the L4-5 level.  Patties were placed and continued dissection was performed.  Once adequate exposure was obtained dura was repaired with interrupted 4-0 Nurolon sutures total of four 5 sutures were placed followed by DuraSeal.  The CRNA back the patient at 30 cm pressure without leakage.  Continue decompression was performed with the 3 4 and 4 5 level.  Operative microscope was used once self-retaining retractors placed at the beginning of the case and lamina was thinned down with a 4 mm bur and two 3 mm Kerrisons were used as well as 4 mm  Kerrison for decompression.  There was adherence to the dura at 3 4 and also at L2-3 level but dura was intact at these levels.  Dural tube was round.  Repeat Valsalva and inspection of the area of the repair showed no leakage.  Gutters were checked to make sure all thrombin soaked Gelfoam was removed.  Bipolar cautery was  used for epidural bleeding as needed.  Copious irrigation was used multiple times during the case.  Meticulous closure with fascia deep layers of 0 Vicryl #1 Vicryl 2-0 Vicryl and skin closure was performed.  Postop dressing and patient was transferred to recovery room where is neurologically intact with hip flexors quads hamstrings anterior tib EHL had intact sensation to his lower extremities normal motor and sensory function.  Foley catheter was placed at the end of the case since patient will remain supine due to the dural repair in the postoperative time..  In the recovery room patient had no headache.  Patient was stable and tolerated the procedure well.

## 2018-01-18 NOTE — Transfer of Care (Signed)
Immediate Anesthesia Transfer of Care Note  Patient: Dean [name]  Procedure(s) Performed: L2-3, L3-4, L-4-5 CENTRAL DECOMPRESSION (N/A )  Patient Location: PACU  Anesthesia Type:General  Level of Consciousness: awake, alert  and oriented  Airway & Oxygen Therapy: Patient Spontanous Breathing and Patient connected to nasal cannula oxygen  Post-op Assessment: Report given to RN and Post -op Vital signs reviewed and stable  Post vital signs: Reviewed and stable  Last Vitals:  Vitals Value Taken Time  BP 122/51 01/18/2018 12:34 PM  Temp 36.8 C 01/18/2018 12:30 PM  Pulse 71 01/18/2018 12:42 PM  Resp 22 01/18/2018 12:42 PM  SpO2 100 % 01/18/2018 12:42 PM  Vitals shown include unvalidated device data.  Last Pain:  Vitals:   01/18/18 0637  PainSc: 0-No pain         Complications: No apparent anesthesia complications

## 2018-01-19 ENCOUNTER — Encounter (HOSPITAL_COMMUNITY): Payer: Self-pay | Admitting: Orthopaedic Surgery

## 2018-01-19 LAB — CBC
HCT: 33.5 % — ABNORMAL LOW (ref 39.0–52.0)
Hemoglobin: 10.6 g/dL — ABNORMAL LOW (ref 13.0–17.0)
MCH: 27 pg (ref 26.0–34.0)
MCHC: 31.6 g/dL (ref 30.0–36.0)
MCV: 85.2 fL (ref 78.0–100.0)
Platelets: 150 10*3/uL (ref 150–400)
RBC: 3.93 MIL/uL — ABNORMAL LOW (ref 4.22–5.81)
RDW: 14.8 % (ref 11.5–15.5)
WBC: 13.7 10*3/uL — ABNORMAL HIGH (ref 4.0–10.5)

## 2018-01-19 LAB — GLUCOSE, CAPILLARY
Glucose-Capillary: 108 mg/dL — ABNORMAL HIGH (ref 65–99)
Glucose-Capillary: 111 mg/dL — ABNORMAL HIGH (ref 65–99)

## 2018-01-19 LAB — BASIC METABOLIC PANEL
Anion gap: 8 (ref 5–15)
BUN: 26 mg/dL — ABNORMAL HIGH (ref 6–20)
CO2: 27 mmol/L (ref 22–32)
Calcium: 9.2 mg/dL (ref 8.9–10.3)
Chloride: 100 mmol/L — ABNORMAL LOW (ref 101–111)
Creatinine, Ser: 1.85 mg/dL — ABNORMAL HIGH (ref 0.61–1.24)
GFR calc Af Amer: 41 mL/min — ABNORMAL LOW (ref >60)
GFR calc non Af Amer: 36 mL/min — ABNORMAL LOW (ref >60)
Glucose, Bld: 103 mg/dL — ABNORMAL HIGH (ref 65–99)
Potassium: 4.4 mmol/L (ref 3.5–5.1)
Sodium: 135 mmol/L (ref 135–145)

## 2018-01-19 NOTE — Progress Notes (Signed)
Will start OOB training and PT evals Sunday. Will await PT recs for DC planning.

## 2018-01-19 NOTE — Progress Notes (Signed)
   Subjective: 1 Day Post-Op Procedure(s) (LRB): L2-3, L3-4, L-4-5 CENTRAL DECOMPRESSION (N/A) Patient reports pain as mild, incisional pain. No leg pain. Foley catheter in place.   Objective: Vital signs in last 24 hours: Temp:  [97.7 F (36.5 C)-98.7 F (37.1 C)] 98.5 F (36.9 C) (04/27 0658) Pulse Rate:  [64-82] 82 (04/27 0658) Resp:  [16-19] 16 (04/26 1551) BP: (120-162)/(44-68) 162/51 (04/27 0658) SpO2:  [93 %-100 %] 99 % (04/27 0658)  Intake/Output from previous day: 04/26 0701 - 04/27 0700 In: 2850 [I.V.:2300; IV Piggyback:550] Out: 2250 [Urine:1750; Blood:500] Intake/Output this shift: Total I/O In: 320 [P.O.:320] Out: -   Recent Labs    01/19/18 0637  HGB 10.6*   Recent Labs    01/19/18 0637  WBC 13.7*  RBC 3.93*  HCT 33.5*  PLT 150   Recent Labs    01/19/18 0637  NA 135  K 4.4  CL 100*  CO2 27  BUN 26*  CREATININE 1.85*  GLUCOSE 103*  CALCIUM 9.2   No results for input(s): LABPT, INR in the last 72 hours.  Neurologically intact no LE weakness. No headache. Normal LE sensation.  No results found.  Assessment/Plan: 1 Day Post-Op Procedure(s) (LRB): L2-3, L3-4, L-4-5 CENTRAL DECOMPRESSION (N/A) Plan: will allow to sit up after 6 PM tonight. If no headache sitting we will start OOB with therapy on Sunday AM. Foley remains in since he is still at bedrest.   Dean [name] 01/19/2018, 12:35 PM

## 2018-01-20 LAB — GLUCOSE, CAPILLARY
Glucose-Capillary: 101 mg/dL — ABNORMAL HIGH (ref 65–99)
Glucose-Capillary: 115 mg/dL — ABNORMAL HIGH (ref 65–99)
Glucose-Capillary: 191 mg/dL — ABNORMAL HIGH (ref 65–99)
Glucose-Capillary: 116 mg/dL — ABNORMAL HIGH (ref 65–99)

## 2018-01-20 NOTE — Plan of Care (Signed)
  Problem: Safety: Goal: Ability to remain free from injury will improve Outcome: Progressing   

## 2018-01-20 NOTE — Progress Notes (Signed)
   Subjective: 2 Days Post-Op Procedure(s) (LRB): L2-3, L3-4, L-4-5 CENTRAL DECOMPRESSION (N/A) Patient reports pain as mild.    Objective: Vital signs in last 24 hours: Temp:  [98.5 F (36.9 C)-101.1 F (38.4 C)] 98.5 F (36.9 C) (04/28 0959) Pulse Rate:  [57-84] 57 (04/28 1214) Resp:  [14-22] 22 (04/28 1214) BP: (98-159)/(35-65) 98/39 (04/28 1214) SpO2:  [94 %-100 %] 99 % (04/28 1214)  Intake/Output from previous day: 04/27 0701 - 04/28 0700 In: 450 [P.O.:440; I.V.:10] Out: 2000 [Urine:2000] Intake/Output this shift: Total I/O In: 120 [P.O.:120] Out: -   Recent Labs    01/19/18 0637  HGB 10.6*   Recent Labs    01/19/18 0637  WBC 13.7*  RBC 3.93*  HCT 33.5*  PLT 150   Recent Labs    01/19/18 0637  NA 135  K 4.4  CL 100*  CO2 27  BUN 26*  CREATININE 1.85*  GLUCOSE 103*  CALCIUM 9.2   No results for input(s): LABPT, INR in the last 72 hours.  Neurologically intact denies headache. Sensory and motor intact and bilateral  No results found.  Assessment/Plan: 2 Days Post-Op Procedure(s) (LRB): L2-3, L3-4, L-4-5 CENTRAL DECOMPRESSION (N/A) Up with therapy.  If he gets a headache will need to go back to bedrest.   [name] 01/20/2018, 12:18 PM

## 2018-01-20 NOTE — Evaluation (Signed)
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  Clinical Impression   Pt admitted with above diagnosis. Pt currently with functional limitations due to the deficits listed below (see PT Problem List). Presents with decr mobility, decr activity tolerance; Took mobilizing slowly and carefully, and no headache reported;  Pt will benefit from skilled PT to increase their independence and safety with mobility to allow discharge to the venue listed below.       Follow Up Recommendations Home health PT;Supervision - Intermittent    Equipment Recommendations  Rolling walker with 5" wheels;3in1 (PT)    Recommendations for Other Services OT consult     Precautions / Restrictions Precautions Precautions: Back Precaution Booklet Issued: No Precaution Comments: Pt educated in back prec      Mobility  Bed Mobility Overal bed mobility: Needs Assistance Bed Mobility: Rolling;Sidelying to Sit Rolling: Min assist Sidelying to sit: Mod assist       General bed mobility comments: Light mod assist to elevate trunk to sit; cues for logroll technqiue  Transfers Overall transfer level: Needs assistance Equipment used: Rolling walker (2 wheeled) Transfers: Sit to/from Stand Sit to Stand: Min assist         General transfer comment: Min assist to power up; cues for hand placement and to self-monitor for activity  tolerance  Ambulation/Gait Ambulation/Gait assistance: Min guard Ambulation Distance (Feet): 70 Feet Assistive device: Rolling walker (2 wheeled) Gait Pattern/deviations: Step-through pattern;Decreased step length - right;Decreased step length - left;Decreased stride length     General Gait Details: Cues to self-monitor for activity tolerance; No headache with ambulation; heavy dependence on RW  Stairs            Wheelchair Mobility    Modified Rankin (Stroke Patients Only)       Balance                                             Pertinent Vitals/Pain Pain Assessment: Faces Faces Pain Scale: Hurts a little bit Pain Location: back pain when he coughs Pain Descriptors / Indicators: Sore Pain Intervention(s): Monitored during session    Home Living Family/patient expects to be discharged to:: Private residence Living Arrangements: Children Available Help at Discharge: Family;Available PRN/intermittently Type of Home: House Home Access: Level entry     Home Layout: Two level;Able to live on main level with bedroom/bathroom        Prior Function Level of Independence: Independent               Hand Dominance   Dominant Hand: Right    Extremity/Trunk Assessment   Upper Extremity Assessment Upper Extremity Assessment: Generalized weakness    Lower Extremity Assessment Lower Extremity Assessment: Generalized weakness       Communication   Communication: No difficulties  Cognition Arousal/Alertness: Awake/alert Behavior During Therapy: WFL for tasks assessed/performed Overall Cognitive Status: Within Functional Limits for tasks assessed  General Comments      Exercises     Assessment/Plan    PT Assessment Patient needs continued PT services  PT Problem List Decreased strength;Decreased range of motion;Decreased activity tolerance;Decreased balance;Decreased  mobility;Decreased knowledge of use of DME;Decreased safety awareness;Decreased knowledge of precautions;Pain       PT Treatment Interventions DME instruction;Gait training;Stair training;Functional mobility training;Therapeutic activities;Therapeutic exercise;Balance training;Patient/family education    PT Goals (Current goals can be found in the Care Plan section)  Acute Rehab PT Goals Patient Stated Goal: back to independence PT Goal Formulation: With patient Time For Goal Achievement: 02/03/18 Potential to Achieve Goals: Good    Frequency Min 5X/week   Barriers to discharge        Co-evaluation               AM-PAC PT "6 Clicks" Daily Activity  Outcome Measure Difficulty turning over in bed (including adjusting bedclothes, sheets and blankets)?: A Lot Difficulty moving from lying on back to sitting on the side of the bed? : Unable Difficulty sitting down on and standing up from a chair with arms (e.g., wheelchair, bedside commode, etc,.)?: Unable Help needed moving to and from a bed to chair (including a wheelchair)?: A Little Help needed walking in hospital room?: A Little Help needed climbing 3-5 steps with a railing? : A Lot 6 Click Score: 12    End of Session Equipment Utilized During Treatment: Gait belt Activity Tolerance: Patient tolerated treatment well Patient left: in chair;with call bell/phone within reach Nurse Communication: Mobility status PT Visit Diagnosis: Other abnormalities of gait and mobility (R26.89);Muscle weakness (generalized) (M62.81);Pain Pain - part of body: (Back)    Time: 1425-1500 PT Time Calculation (min) (ACUTE ONLY): 35 min   Charges:   PT Evaluation $PT Eval Moderate Complexity: 1 Mod PT Treatments $Gait Training: 8-22 mins   PT G Codes:        Roney Marion, PT  Acute Rehabilitation Services Pager [phone] Office [phone]   Colletta [place] 01/20/2018, 4:35 PM

## 2018-01-20 NOTE — Progress Notes (Signed)
Plan D/C foley in AM, continue therapy. Ambulated in hall. Possible home Monday PM if continues to improve.

## 2018-01-21 ENCOUNTER — Telehealth (INDEPENDENT_AMBULATORY_CARE_PROVIDER_SITE_OTHER): Payer: Self-pay | Admitting: Orthopaedic Surgery

## 2018-01-21 ENCOUNTER — Other Ambulatory Visit: Payer: Self-pay

## 2018-01-21 ENCOUNTER — Encounter (HOSPITAL_COMMUNITY): Payer: Self-pay | Admitting: General Practice

## 2018-01-21 LAB — GLUCOSE, CAPILLARY
Glucose-Capillary: 95 mg/dL (ref 65–99)
Glucose-Capillary: 104 mg/dL — ABNORMAL HIGH (ref 65–99)

## 2018-01-21 MED ORDER — BISACODYL 10 MG RE SUPP: 10.0000 mg | Freq: Every day | RECTAL | Status: DC | PRN

## 2018-01-21 MED ORDER — OXYCODONE HCL 5 MG PO TABS: 5.0000 mg | ORAL_TABLET | Freq: Four times a day (QID) | ORAL | 0 refills | Status: DC | PRN

## 2018-01-21 NOTE — Telephone Encounter (Signed)
I called.

## 2018-01-21 NOTE — Evaluation (Signed)
Occupational Therapy Evaluation Patient Details Name: Dean [name] MRN: [id number] DOB: [birth year] Today's Date: 01/21/2018    History of Present Illness Admitted for L2-3, L3-4, L-4-5 CENTRAL DECOMPRESSION, complicated by dural tear; Has been on strict bed flat bedrest since surgery, Ok for gentle mobility/amb 4/28;  has a past medical history of AICD (automatic cardioverter/defibrillator) present (10/10/2016), Arthritis, Asthma, Cardiac arrest ([place]) (08/27/2016), CHF (congestive heart failure) ([place]), Coronary artery disease,  Ischemic cardiomyopathy, OSA on CPAP, and Type II diabetes mellitus ([place]).   Clinical Impression   Patient is s/p L 2-3 L3-4 L4-5 central decompression with dural tear surgery resulting in functional limitations due to the deficits listed below (see OT problem list). Pt currently requires max (A) for LB adls and total (A) for ted hose. Pt with poor recall of precautions ( only remembering bending and poor return demo) Patient will benefit from skilled OT acutely to increase independence and safety with ADLS to allow discharge [place].     Follow Up Recommendations  Home health OT    Equipment Recommendations  3 in 1 bedside commode(bariatric/ RW)    Recommendations for Other Services       Precautions / Restrictions Precautions Precautions: Back Precaution Comments: educated on back precautions for adls      Mobility Bed Mobility Overal bed mobility: Needs Assistance Bed Mobility: Supine to Sit;Rolling Rolling: Min assist   Supine to sit: Min assist     General bed mobility comments: pt educated on hand placement and pushing up on elbow from bed surface. pt initially attempting to pull on therapist as method to side<>sit  Transfers Overall transfer level: Needs assistance Equipment used: Rolling walker (2 wheeled) Transfers: Sit to/from Stand Sit to Stand: Min assist         General transfer comment: incr time to power up , pt with one hand on  RW. pt needed rocking momentum     Balance                                           ADL either performed or assessed with clinical judgement   ADL Overall ADL's : Needs assistance/impaired Eating/Feeding: Modified independent Eating/Feeding Details (indicate cue type and reason): sitting in chair Grooming: Minimal assistance;Standing Grooming Details (indicate cue type and reason): pt requires bil UE for full support in RW. pt unable to static stand at sink without leaning on sink on elbows. pt bending at hips and cues for upright posture Upper Body Bathing: Minimal assistance   Lower Body Bathing: Maximal assistance   Upper Body Dressing : Minimal assistance   Lower Body Dressing: Maximal assistance Lower Body Dressing Details (indicate cue type and reason): pt unable to dress bil LE with figure 4. pt educated to don ted hose daily and only doff for sleeping / bathing. Pt to wear until follow up appointment Toilet Transfer: Minimal assistance;RW         Tub/Shower Transfer Details (indicate cue type and reason): unable to perform at this time educated to sponge bath only Functional mobility during ADLs: Min guard;Rolling walker General ADL Comments: pt unable to recall back precautions. pt with teach back provided x3 during session and having patient repeat to help with recall. pt plans to have daughter help with all adls. pt plans to have daughter dress LB per patients report     Vision  Perception     Praxis      Pertinent Vitals/Pain Pain Assessment: Faces Faces Pain Scale: Hurts little more Pain Location: back Pain Descriptors / Indicators: Operative site guarding Pain Intervention(s): Monitored during session;Premedicated before session;Repositioned     Hand Dominance Right   Extremity/Trunk Assessment Upper Extremity Assessment Upper Extremity Assessment: Generalized weakness   Lower Extremity Assessment Lower Extremity  Assessment: Generalized weakness;Defer to PT evaluation   Cervical / Trunk Assessment Cervical / Trunk Assessment: Other exceptions Cervical / Trunk Exceptions: s/p surg   Communication Communication Communication: No difficulties   Cognition Arousal/Alertness: Awake/alert Behavior During Therapy: WFL for tasks assessed/performed Overall Cognitive Status: Within Functional Limits for tasks assessed                                     General Comments  dressing dry and intact at this time. pt denies HA    Exercises     Shoulder Instructions      Home Living Family/patient expects to be discharged to:: Private residence Living Arrangements: Children Available Help at Discharge: Family;Available PRN/intermittently Type of Home: House Home Access: Level entry     Home Layout: Two level;Able to live on main level with bedroom/bathroom     Bathroom Shower/Tub: Teacher, early years/pre: Standard Bathroom Accessibility: Yes How Accessible: Accessible via walker Home Equipment: None   Additional Comments: one child lives at home and 4 other grown children       Prior Functioning/Environment Level of Independence: Independent                 OT Problem List: Decreased activity tolerance;Decreased knowledge of precautions;Decreased knowledge of use of DME or AE;Decreased safety awareness;Decreased strength;Decreased cognition;Impaired balance (sitting and/or standing);Pain      OT Treatment/Interventions: Self-care/ADL training;Therapeutic exercise;Energy conservation;DME and/or AE instruction;Therapeutic activities;Cognitive remediation/compensation;Balance training;Patient/family education    OT Goals(Current goals can be found in the care plan section) Acute Rehab OT Goals Patient Stated Goal: back to independence OT Goal Formulation: With patient Time For Goal Achievement: 02/04/18 Potential to Achieve Goals: Good  OT Frequency: Min  2X/week   Barriers to D/C:            Co-evaluation              AM-PAC PT "6 Clicks" Daily Activity     Outcome Measure Help from another person eating meals?: None Help from another person taking care of personal grooming?: A Little Help from another person toileting, which includes using toliet, bedpan, or urinal?: A Lot Help from another person bathing (including washing, rinsing, drying)?: A Lot Help from another person to put on and taking off regular upper body clothing?: A Lot Help from another person to put on and taking off regular lower body clothing?: Total 6 Click Score: 14   End of Session Equipment Utilized During Treatment: Rolling walker Nurse Communication: Mobility status;Precautions  Activity Tolerance: Patient tolerated treatment well Patient left: in chair;with call bell/phone within reach(eating breakfast)  OT Visit Diagnosis: Unsteadiness on feet (R26.81)                Time: 9629-5284 OT Time Calculation (min): 18 min Charges:  OT General Charges $OT Visit: 1 Visit OT Evaluation $OT Eval Moderate Complexity: 1 Mod G-Codes:      Jeri Modena   OTR/L Pager: [phone] Office: [phone] .   Parke Poisson B 01/21/2018, 9:30 AM

## 2018-01-21 NOTE — Telephone Encounter (Signed)
Please advise 

## 2018-01-21 NOTE — Plan of Care (Signed)
Pt able to verbalize safety measures. Pt able to demonstrate safety measures including using call bell.

## 2018-01-21 NOTE — Discharge Instructions (Signed)
Walk daily, OK to shower. Call for appointment with Dr. Lorin Mercy in one week [phone].

## 2018-01-21 NOTE — Progress Notes (Signed)
Physical Therapy Treatment Patient Details Name: Dean [name] MRN: [id number] DOB: 1951/03/24 Today's Date: 01/21/2018    History of Present Illness Admitted for L2-3, L3-4, L-4-5 CENTRAL DECOMPRESSION, complicated by dural tear; Has been on strict bed flat bedrest since surgery, Ok for gentle mobility/amb 4/28;  has a past medical history of AICD (automatic cardioverter/defibrillator) present (10/10/2016), Arthritis, Asthma, Cardiac arrest ([place]) (08/27/2016), CHF (congestive heart failure) ([place]), Coronary artery disease,  Ischemic cardiomyopathy, OSA on CPAP, and Type II diabetes mellitus ([place]).    PT Comments    Pt progressing towards physical therapy goals. Was able to recall 3/3 precautions without cues by end of session, however at beginning of session was able to recall 0/3 even with cues. Overall poor recall of education by both pt and visitor who will be taking pt home. Reinforced education for precautions, car transfer, stair negotiation, and general safety with activity progression. Will continue to follow and progress as able per POC.    Follow Up Recommendations  Home health PT;Supervision - Intermittent     Equipment Recommendations  Rolling walker with 5" wheels;3in1 (PT)    Recommendations for Other Services       Precautions / Restrictions Precautions Precautions: Back Precaution Booklet Issued: No Precaution Comments: Reviewed handout and pt was cued for precautions during functional mobility Required Braces or Orthoses: (No brace needed order) Restrictions Weight Bearing Restrictions: No    Mobility  Bed Mobility Overal bed mobility: Needs Assistance Bed Mobility: Supine to Sit;Rolling Rolling: Min assist   Supine to sit: Min assist     General bed mobility comments: Reviewed log roll verbally. Pt sitting up in recliner upon PT arrival.   Transfers Overall transfer level: Needs assistance Equipment used: Rolling walker (2 wheeled) Transfers: Sit  to/from Stand Sit to Stand: Min assist;Min guard         General transfer comment: Min assist for first attempt, with min guard for subsequent attempts. Increased time and effort to power-up to full stand.   Ambulation/Gait Ambulation/Gait assistance: Min guard Ambulation Distance (Feet): 175 Feet Assistive device: Rolling walker (2 wheeled) Gait Pattern/deviations: Step-through pattern;Decreased step length - right;Decreased step length - left;Decreased stride length Gait velocity: Decreased Gait velocity interpretation: <1.8 ft/sec, indicate of risk for recurrent falls General Gait Details: No HA noted throughout gait training. Pt somewhat self limiting for distance - when asked, unable to report reason why he wanted to turn around and return to room. Did not state he was fatigued, dizzy, in pain, etc.    Stairs Stairs: Yes Stairs assistance: Min guard;Min assist Stair Management: No rails;Forwards;With walker Number of Stairs: 2(1 step x2 trials) General stair comments: Pt practiced with a curb step to simulate home envrionment. Visitor present in room educated on how to assist/guard as well. Pt was able to negotiate 1 step x2 with assist for balance support and walker management.    Wheelchair Mobility    Modified Rankin (Stroke Patients Only)       Balance Overall balance assessment: Needs assistance Sitting-balance support: Feet supported;No upper extremity supported Sitting balance-Leahy Scale: Fair     Standing balance support: No upper extremity supported;During functional activity Standing balance-Leahy Scale: Poor Standing balance comment: Reliant on RW for support                            Cognition Arousal/Alertness: Awake/alert Behavior During Therapy: WFL for tasks assessed/performed Overall Cognitive Status: Within Functional Limits for tasks assessed  Exercises      General  Comments General comments (skin integrity, edema, etc.): dressing dry and intact at this time. pt denies HA      Pertinent Vitals/Pain Pain Assessment: Faces Faces Pain Scale: Hurts little more Pain Location: back Pain Descriptors / Indicators: Operative site guarding Pain Intervention(s): Monitored during session    Home Living Family/patient expects to be discharged to:: Private residence Living Arrangements: Children Available Help at Discharge: Family;Available PRN/intermittently Type of Home: House Home Access: Level entry   Home Layout: Two level;Able to live on main level with bedroom/bathroom Home Equipment: None Additional Comments: one child lives at home and 4 other grown children     Prior Function Level of Independence: Independent          PT Goals (current goals can now be found in the care plan section) Acute Rehab PT Goals Patient Stated Goal: back to independence PT Goal Formulation: With patient Time For Goal Achievement: 02/03/18 Potential to Achieve Goals: Good Progress towards PT goals: Progressing toward goals    Frequency    Min 5X/week      PT Plan Current plan remains appropriate    Co-evaluation              AM-PAC PT "6 Clicks" Daily Activity  Outcome Measure  Difficulty turning over in bed (including adjusting bedclothes, sheets and blankets)?: A Lot Difficulty moving from lying on back to sitting on the side of the bed? : Unable Difficulty sitting down on and standing up from a chair with arms (e.g., wheelchair, bedside commode, etc,.)?: Unable Help needed moving to and from a bed to chair (including a wheelchair)?: A Little Help needed walking in hospital room?: A Little Help needed climbing 3-5 steps with a railing? : A Lot 6 Click Score: 12    End of Session Equipment Utilized During Treatment: Gait belt Activity Tolerance: Patient tolerated treatment well Patient left: in chair;with call bell/phone within reach Nurse  Communication: Mobility status PT Visit Diagnosis: Other abnormalities of gait and mobility (R26.89);Muscle weakness (generalized) (M62.81);Pain Pain - part of body: (Back)     Time: 0109-3235 PT Time Calculation (min) (ACUTE ONLY): 29 min  Charges:  $Gait Training: 23-37 mins                    G Codes:       Rolinda Roan, PT, DPT Acute Rehabilitation Services Pager: [place] 01/21/2018, 11:11 AM

## 2018-01-21 NOTE — Progress Notes (Signed)
Standby assist for pt to get up from chair and transfer to bathroom. Pt slow to stand up, attempt x2 before able to stand. Pt advised to use walker to ambulate to bathroom and sit to use toilet. Pt able to use walker and refused to sit to use toilet. Pt unable to pull up pants by self. Pt able to ambulate back to chair with standby assist and verbal cues using walker. Pt urinated 460ml.

## 2018-01-21 NOTE — Telephone Encounter (Signed)
Lis from [facility] called stating that they need orders for [facility] PT/OT, rolling walker, 3 in 1, and the social worker needs a face to face.  CB#[phone].  Thank you.

## 2018-01-21 NOTE — Progress Notes (Signed)
   Subjective: 3 Days Post-Op Procedure(s) (LRB): L2-3, L3-4, L-4-5 CENTRAL DECOMPRESSION (N/A) Patient reports pain as mild.    Objective: Vital signs in last 24 hours: Temp:  [98.1 F (36.7 C)-98.5 F (36.9 C)] 98.1 F (36.7 C) (04/29 0513) Pulse Rate:  [57-75] 75 (04/29 0513) Resp:  [13-22] 16 (04/29 0513) BP: (98-137)/(35-51) 134/51 (04/29 0513) SpO2:  [94 %-99 %] 94 % (04/29 0513)  Intake/Output from previous day: 04/28 0701 - 04/29 0700 In: 120 [P.O.:120] Out: 1951 [Urine:1951] Intake/Output this shift: No intake/output data recorded.  Recent Labs    01/19/18 0637  HGB 10.6*   Recent Labs    01/19/18 0637  WBC 13.7*  RBC 3.93*  HCT 33.5*  PLT 150   Recent Labs    01/19/18 0637  NA 135  K 4.4  CL 100*  CO2 27  BUN 26*  CREATININE 1.85*  GLUCOSE 103*  CALCIUM 9.2   No results for input(s): LABPT, INR in the last 72 hours.  Neurologically intact No results found.  Assessment/Plan: 3 Days Post-Op Procedure(s) (LRB): L2-3, L3-4, L-4-5 CENTRAL DECOMPRESSION (N/A) Up with therapy, d/c foley, if patient does well with therapy discharge home. Dulcolax supp order if unable to have bowel movement. Office one week. OK to shower  Dean [name] 01/21/2018, 7:43 AM

## 2018-01-21 NOTE — Care Management Note (Signed)
Case Management Note  Patient Details  Name: Dean [name] MRN: [id number] Date of Birth: 05/23/1951  Subjective/Objective:   Admitted for L2-3, L3-4, L-4-5 CENTRAL DECOMPRESSION, complicated by dural tear.  PTA, pt independent, lives with son and daughter.                   Action/Plan: Pt states son and daughter able to provide 24h care at dc.  PT/OT recommending HH follow up and pt agreeable.  Referral to [facility] for [facility] needs, per pt choice.  Start of care 24-48h post dc date.  DME to be delivered to pt's room prior to dc.    Expected Discharge Date:  01/21/18               Expected Discharge Plan:  [place]  In-House Referral:     Discharge planning Services  CM Consult  Post Acute Care Choice:  Home Health Choice offered to:     DME Arranged:  3-N-1, Walker rolling DME Agency:  [place] Arranged:  PT, OT [facility] Agency:  [place]  Status of Service:  Completed, signed off  If discussed at [place] of Stay Meetings, dates discussed:    Additional Comments:  Reinaldo Raddle, RN, BSN  Trauma/Neuro ICU Case Manager [phone]

## 2018-01-22 NOTE — Discharge Summary (Signed)
Patient ID: Dean [name] MRN: [id number] DOB/AGE: [birth year] 67 y.o.  Admit date: 01/18/2018 Discharge date: 01/22/2018  Admission Diagnoses:  Active Problems:   Lumbar stenosis   Discharge Diagnoses:  Active Problems:   Lumbar stenosis  status post Procedure(s): L2-3, L3-4, L-4-5 CENTRAL DECOMPRESSION  Past Medical History:  Diagnosis Date  . 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 Arthritis    "maybe in his spine" (09/05/2016)  . Asthma    pt. denies  . Cardiac arrest ([place]) 08/27/2016   REQUIRING CPR, SHOCK & MEDICATIONS  . CHF (congestive heart failure) ([place])   . Coronary artery disease    90% ostRCA stenosis and total occlusion of dRCA with L-->R collaterals 2017  . Dysrhythmia   . Gout   . High cholesterol   . Hypertension   . Ischemic cardiomyopathy   . LBBB (left bundle branch block)   . OSA on CPAP   . Type II diabetes mellitus ([facility])     Surgeries: Procedure(s): L2-3, L3-4, L-4-5 CENTRAL DECOMPRESSION on 01/18/2018   Consultants:   Discharged Condition: Improved  Hospital Course: Dean [name] is an 67 y.o. male who was admitted 01/18/2018 for operative treatment of lumbar stenosis. Patient failed conservative treatments (please see the history and physical for the specifics) and had severe unremitting pain that affects sleep, daily activities and work/hobbies. After pre-op clearance, the patient was taken to the operating room on 01/18/2018 and underwent  Procedure(s): L2-3, L3-4, L-4-5 CENTRAL DECOMPRESSION.    Patient was given perioperative antibiotics:  Anti-infectives (From admission, onward)   Start     Dose/Rate Route Frequency Ordered Stop   01/18/18 1930  ceFAZolin (ANCEF) IVPB 1 g/50 mL premix     1 g 100 mL/hr over 30 Minutes Intravenous Every 8 hours 01/18/18 1614 01/19/18 0352   01/18/18 1615  ceFAZolin (ANCEF) IVPB 1 g/50 mL premix  Status:  Discontinued     1 g 100 mL/hr over 30 Minutes Intravenous Every 8  hours 01/18/18 1608 01/18/18 1614   01/18/18 1230  ceFAZolin (ANCEF) 3 g in dextrose 5 % 50 mL IVPB  Status:  Discontinued     3 g 160 mL/hr over 30 Minutes Intravenous To Surgery 01/18/18 1218 01/18/18 1545   01/18/18 0630  ceFAZolin (ANCEF) 3 g in dextrose 5 % 50 mL IVPB     3 g 130 mL/hr over 30 Minutes Intravenous On call to O.R. 01/18/18 7902 01/18/18 1222       Patient was given sequential compression devices and early ambulation to prevent DVT.   Patient benefited maximally from hospital stay and there were no complications. At the time of discharge, the patient was urinating/moving their bowels without difficulty, tolerating a regular diet, pain is controlled with oral pain medications and they have been cleared by PT/OT.   Recent vital signs:  Patient Vitals for the past 24 hrs:  BP Temp Temp src Pulse Resp SpO2  01/21/18 1522 (!) 120/42 99.4 F (37.4 C) Oral 66 18 95 %     Recent laboratory studies: No results for input(s): WBC, HGB, HCT, PLT, NA, K, CL, CO2, BUN, CREATININE, GLUCOSE, INR, CALCIUM in the last 72 hours.  Invalid input(s): PT, 2   Discharge Medications:   Allergies as of 01/21/2018      Reactions   Contrast Media [iodinated Diagnostic Agents] Shortness Of Breath, Nausea And Vomiting   Lisinopril Cough      Medication List    TAKE these medications   allopurinol 300  MG tablet Commonly known as:  ZYLOPRIM Take 300 mg by mouth daily.   amiodarone 200 MG tablet Commonly known as:  PACERONE TAKE 1 TABLET BY MOUTH EVERY DAY   aspirin 81 MG EC tablet Take 1 tablet (81 mg total) by mouth daily.   atorvastatin 40 MG tablet Commonly known as:  LIPITOR Take 1 tablet (40 mg total) by mouth daily at 6 PM.   carvedilol 25 MG tablet Commonly known as:  COREG TAKE 1 TABLET (25 MG TOTAL) BY MOUTH 2 (TWO) TIMES DAILY WITH A MEAL.   [employer] VITAMIN D3 1000 units capsule Generic drug:  Cholecalciferol Take 1,000 Units by mouth daily.   furosemide 40 MG  tablet Commonly known as:  LASIX Take 40 mg by mouth 2 (two) times daily.   KLOR-CON M20 20 MEQ tablet Generic drug:  potassium chloride SA TAKE 1 TABLET BY MOUTH EVERY DAY   losartan 100 MG tablet Commonly known as:  COZAAR Take 1 tablet (100 mg total) by mouth daily.   magnesium oxide 400 MG tablet Commonly known as:  MAG-OX Take 200 mg by mouth 2 (two) times daily.   metFORMIN 500 MG tablet Commonly known as:  GLUCOPHAGE Take 1 tablet (500 mg total) by mouth daily with breakfast. What changed:  when to take this   oxyCODONE 5 MG immediate release tablet Commonly known as:  Oxy IR/ROXICODONE Take 1 tablet (5 mg total) by mouth every 6 (six) hours as needed for moderate pain ((score 4 to 6)).       Diagnostic Studies: Dg Lumbar Spine 2-3 Views  Result Date: 01/18/2018 CLINICAL DATA:  L2-3, L3-4, L4-5 central decompression EXAM: LUMBAR SPINE - 2-3 VIEW COMPARISON:  11/15/2017 MRI FINDINGS: 1st image demonstrates posterior needle is directed at the L5 pedicles in the L4-5 interspace and the L1-2 disc space. Second lateral intraoperative image demonstrates posterior surgical instruments directed at L5-S1 and L2-3. Third lateral intraoperative image demonstrates posterior surgical instruments directed at L5-S1 and L2-3. IMPRESSION: Intraoperative localization as above. Electronically Signed   By: Rolm Baptise M.D.   On: 01/18/2018 10:47      Follow-up Information    Dean Killings, MD Follow up in 1 week(s).   Specialty:  Orthopedic Surgery Contact information: [place] [address] [phone]        [facility] Follow up.   Specialty:  [place] Why:  Physical and occupational therapy to follow up with you at home Contact information: [address] [place] [address] [phone]           Discharge Plan:  discharge to home  Disposition:     Signed: Benjiman [name]  01/22/2018, 11:29  AM

## 2018-01-26 IMAGING — CT CT ABD-PELV W/ CM
2 of 5 series · 15 of 46 positions shown, 17 images · IV contrast (APPLIED)
Comparison: Report of MR abdomen 03/03/2003 and CT abdomen
03/02/2003 are reviewed. Images are not available.

ADDENDUM:
Endotracheal tube terminates in the right mainstem bronchus, as
dictated in the original report. Retracting approximately 3-4 cm
should better position the tip above the carina. Critical
Value/emergent results were called by telephone at the time of
interpretation on 08/27/2016 at [DATE] to MIRVYDAS AGNETA, who verbally
acknowledged these results.
CLINICAL DATA: Cardiac arrest.



[Series 4: abd/ pelvis 5.0 i30f 1 · axial · 0.88mm/px · z∈[-667,-257]mm · 12 of 92 slices shown, 14 images]
[im 5/92  soft-tissue]
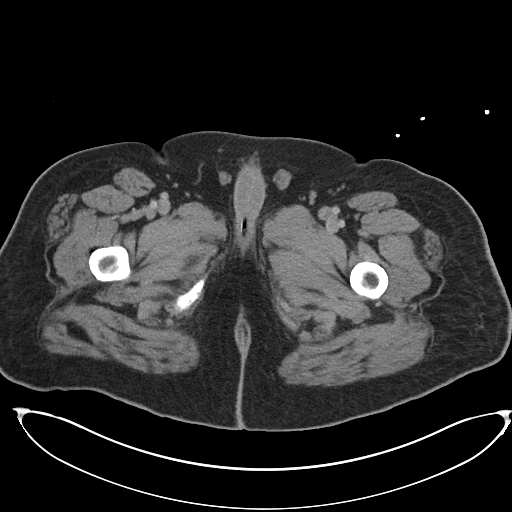
[im 5/92  bone]
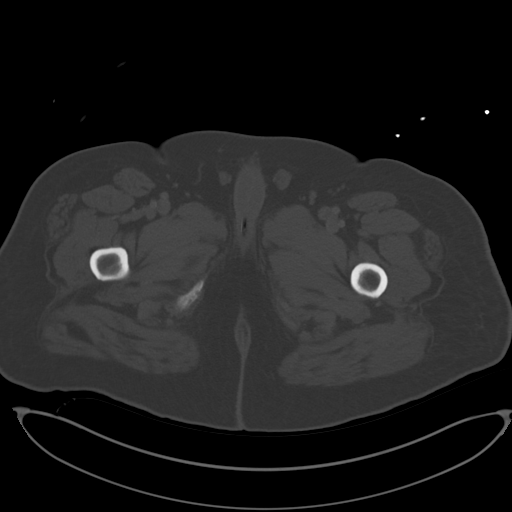
[im 15/92  soft-tissue]
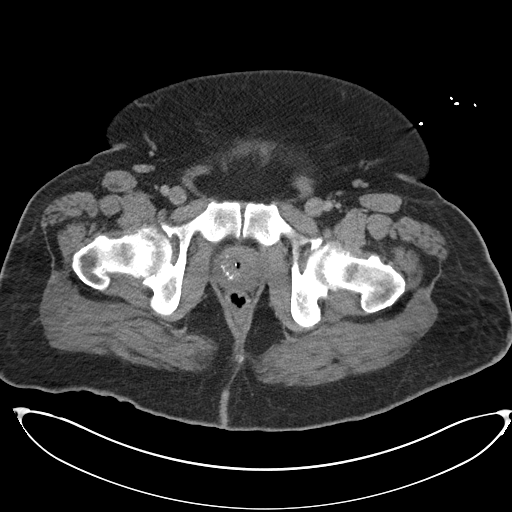
[im 20/92  soft-tissue]
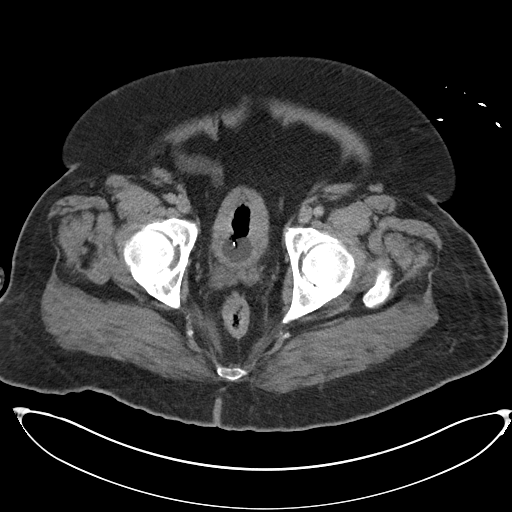
[im 29/92  soft-tissue]
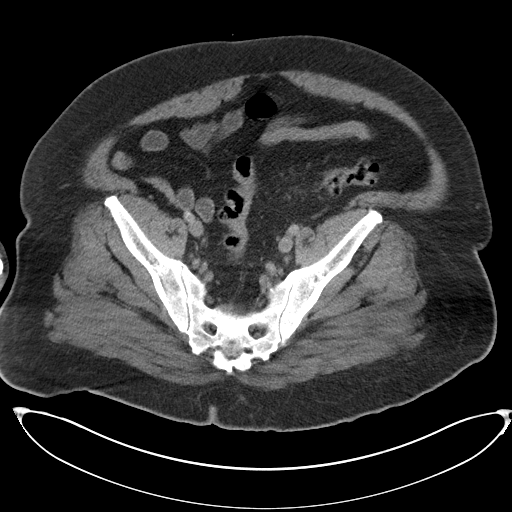
[im 34/92  soft-tissue]
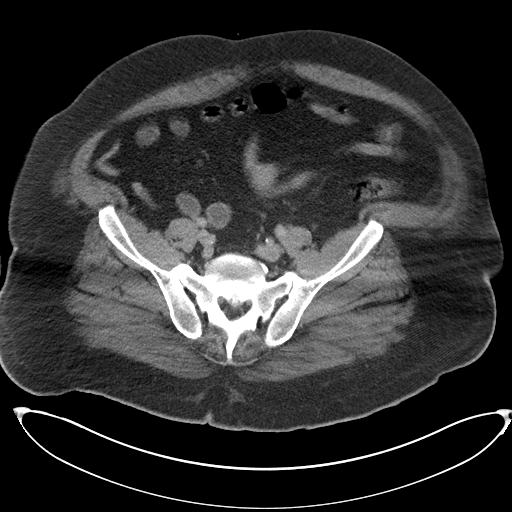
[im 44/92  soft-tissue]
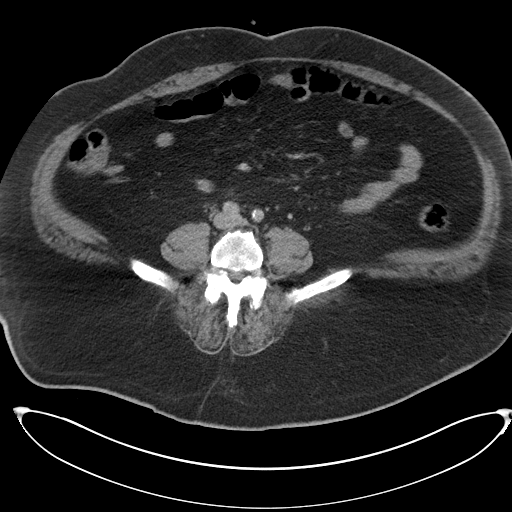
[im 48/92  soft-tissue]
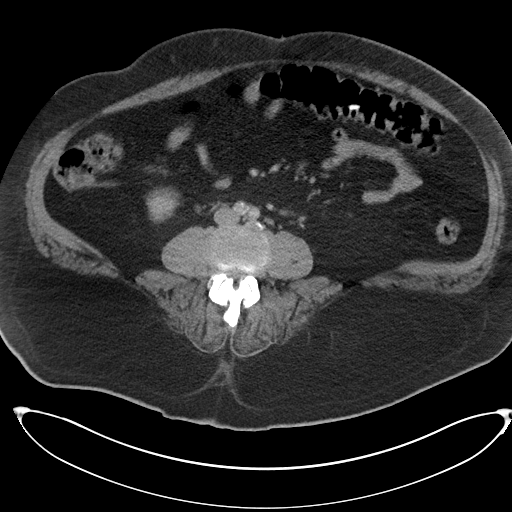
[im 58/92  soft-tissue]
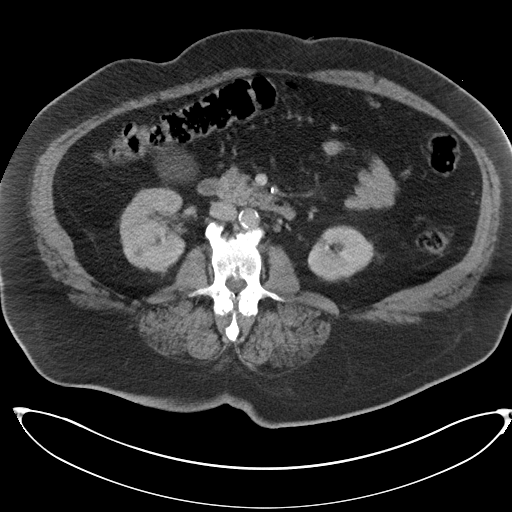
[im 63/92  soft-tissue]
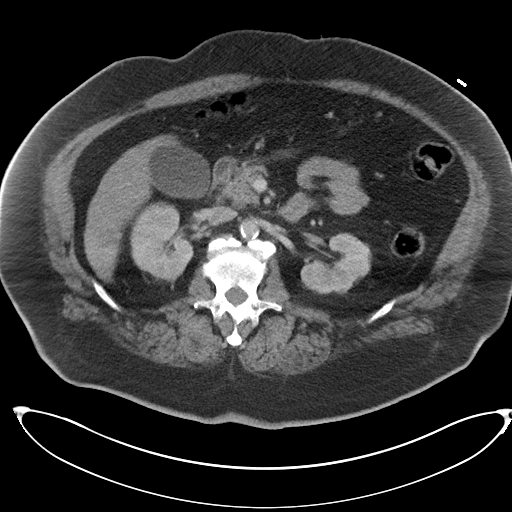
[im 63/92  bone]
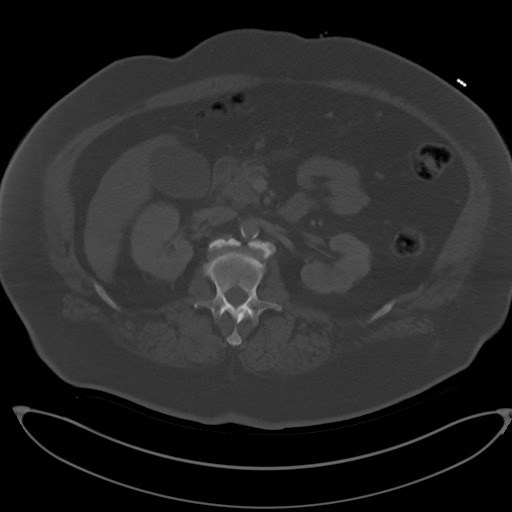
[im 72/92  soft-tissue]
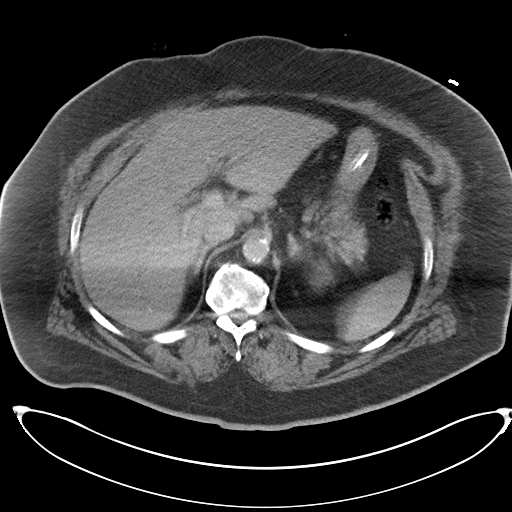
[im 77/92  soft-tissue]
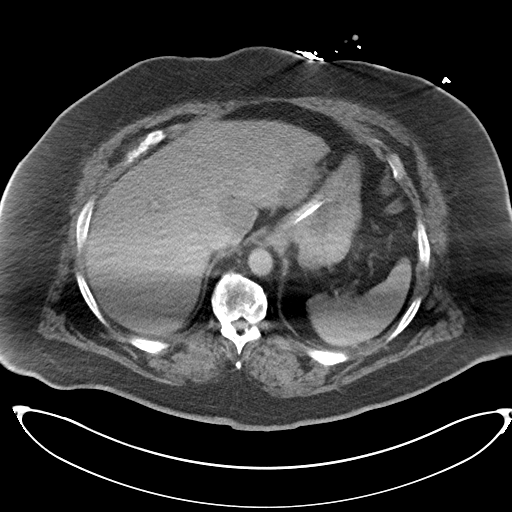
[im 87/92  soft-tissue]
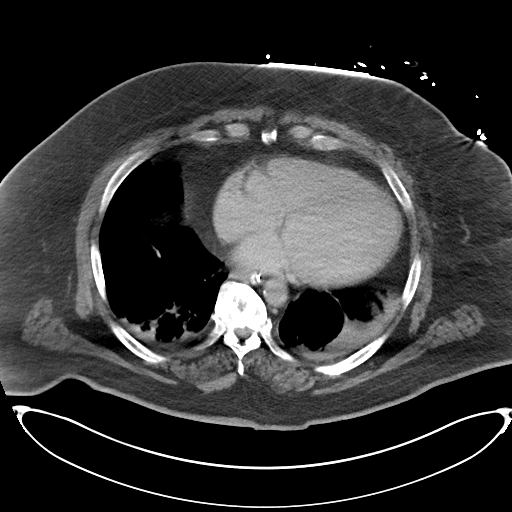

[Series 7: coronals · coronal · 0.93mm/px · 3 of 115 slices shown]
[im 39/115  soft-tissue]
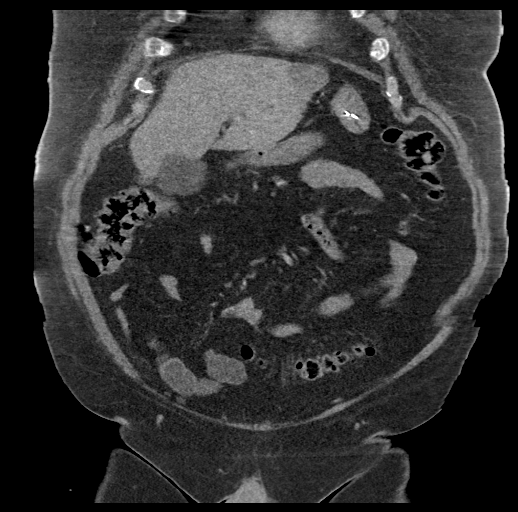
[im 51/115  soft-tissue]
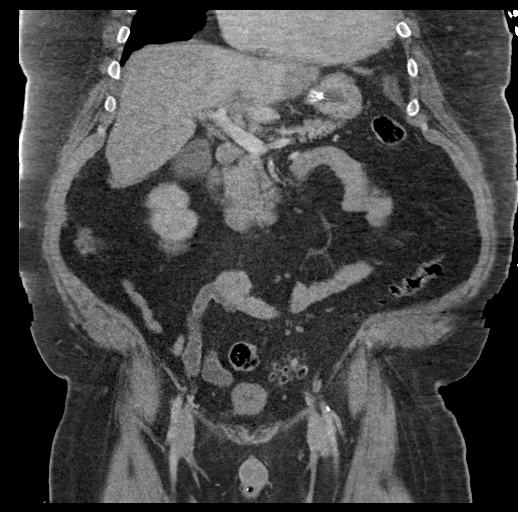
[im 64/115  soft-tissue]
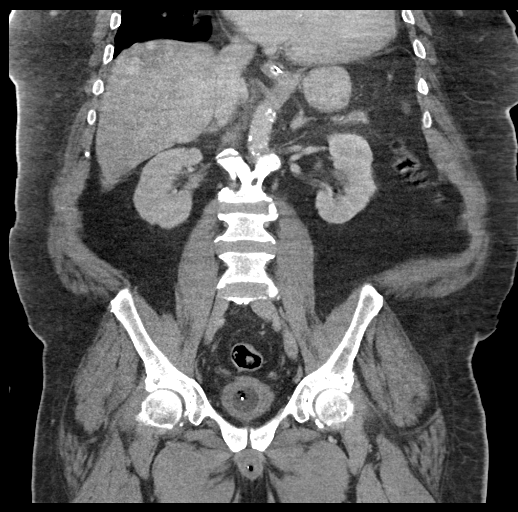

[15 of 46 positions shown; findings below may reference images not displayed]

FINDINGS: CTA CHEST FINDINGS

Cardiovascular: Image quality is degraded by respiratory motion. No
large central pulmonary embolus. Lobar, segmental and subsegmental
pulmonary arteries cannot be evaluate. Atherosclerotic calcification
of the arterial vasculature, including coronary arteries. Pulmonary
arteries and heart are enlarged. No pericardial effusion.

Mediastinum/Nodes: Mediastinal lymph nodes measure up to 11 mm in
the low right paratracheal and prevascular regions. No definite
hilar lymph nodes. Axillary lymph nodes are incompletely imaged and
measure up to highly 13 mm. Nasogastric tube is seen within the
esophagus, terminating in the stomach.

Lungs/Pleura: Image quality is degraded by respiratory motion.
Endotracheal tube terminates in the right mainstem bronchus. Basilar
dependent patchy airspace consolidation. No definite pleural fluid.

Musculoskeletal: No worrisome lytic or sclerotic lesions.
Degenerative changes are seen in the spine. Flowing anterior
osteophytosis in the thoracic spine.

Review of the MIP images confirms the above findings.

CT ABDOMEN and PELVIS FINDINGS

Hepatobiliary: A 4.9 x 7.2 cm low-attenuation lesion in the dome of
the liver appears to have contrast puddling but is not imaged on the
delayed nephrographic phase series. Mildly heterogeneous
low-attenuation lesion in the left hepatic lobe measures 5.1 x
cm. Multiple lesions characterized hemangiomas were reported on
examination of 03/03/2003. Images are not available at the time
dictation. Today's measurements are larger than on that report.
Additional smaller lesions are seen in the caudate and inferior
right hepatic lobe. Gallbladder is unremarkable. No biliary ductal
dilatation.

Pancreas: Negative.

Spleen: Negative.

Adrenals/Urinary Tract: Adrenal glands are unremarkable. Sub cm
low-attenuation lesion in the right kidney is too small to
characterize. Ureters are decompressed. Foley catheter and air are
seen in a decompressed bladder. Bladder wall appears thickened.

Stomach/Bowel: Nasogastric terminates in the stomach. Stomach, small
bowel, appendix and colon are unremarkable.

Vascular/Lymphatic: Atherosclerotic calcification of the arterial
vasculature without abdominal aortic aneurysm. Portacaval lymph node
measures 1.5 cm.

Reproductive: Prostate is visualized and contains calcifications.

Other: No free fluid. Tiny periumbilical hernia contains fat.
Mesenteries and peritoneum are unremarkable.

Musculoskeletal: No worrisome lytic or sclerotic lesions.
Degenerative changes are seen in the spine and hips.
IMPRESSION: 1. Poor assessment of the lobar, segmental and subsegmental
pulmonary arteries due to excessive respiratory motion. No definite
central pulmonary embolus.
2. Dependent pulmonary parenchymal collapse/consolidation
bilaterally. Aspiration and pneumonia are not excluded.
3. Hepatic lesions, previously characterized as hemangiomas on
03/03/2003. Images are not available for comparison. Some lesions
measure larger on today's study. If further assessment is desired,
MR abdomen without and with contrast is recommended, when the
patient is clinically stable and able to hold his breath, preferably
as an outpatient.
4. Bladder wall appears thickened.
5.  Aortic atherosclerosis (VYP1L-170.0).

## 2018-01-26 IMAGING — CR DG CHEST 1V PORT
1 series · 1 of 1 positions shown · non-contrast
Comparison: Chest x-ray 06/04/2015.

CLINICAL DATA: 65-year-old male with history of syncopal event at
home. CPR performed. Patient now intubated.

EXAM:
PORTABLE CHEST 1 VIEW

[AP]
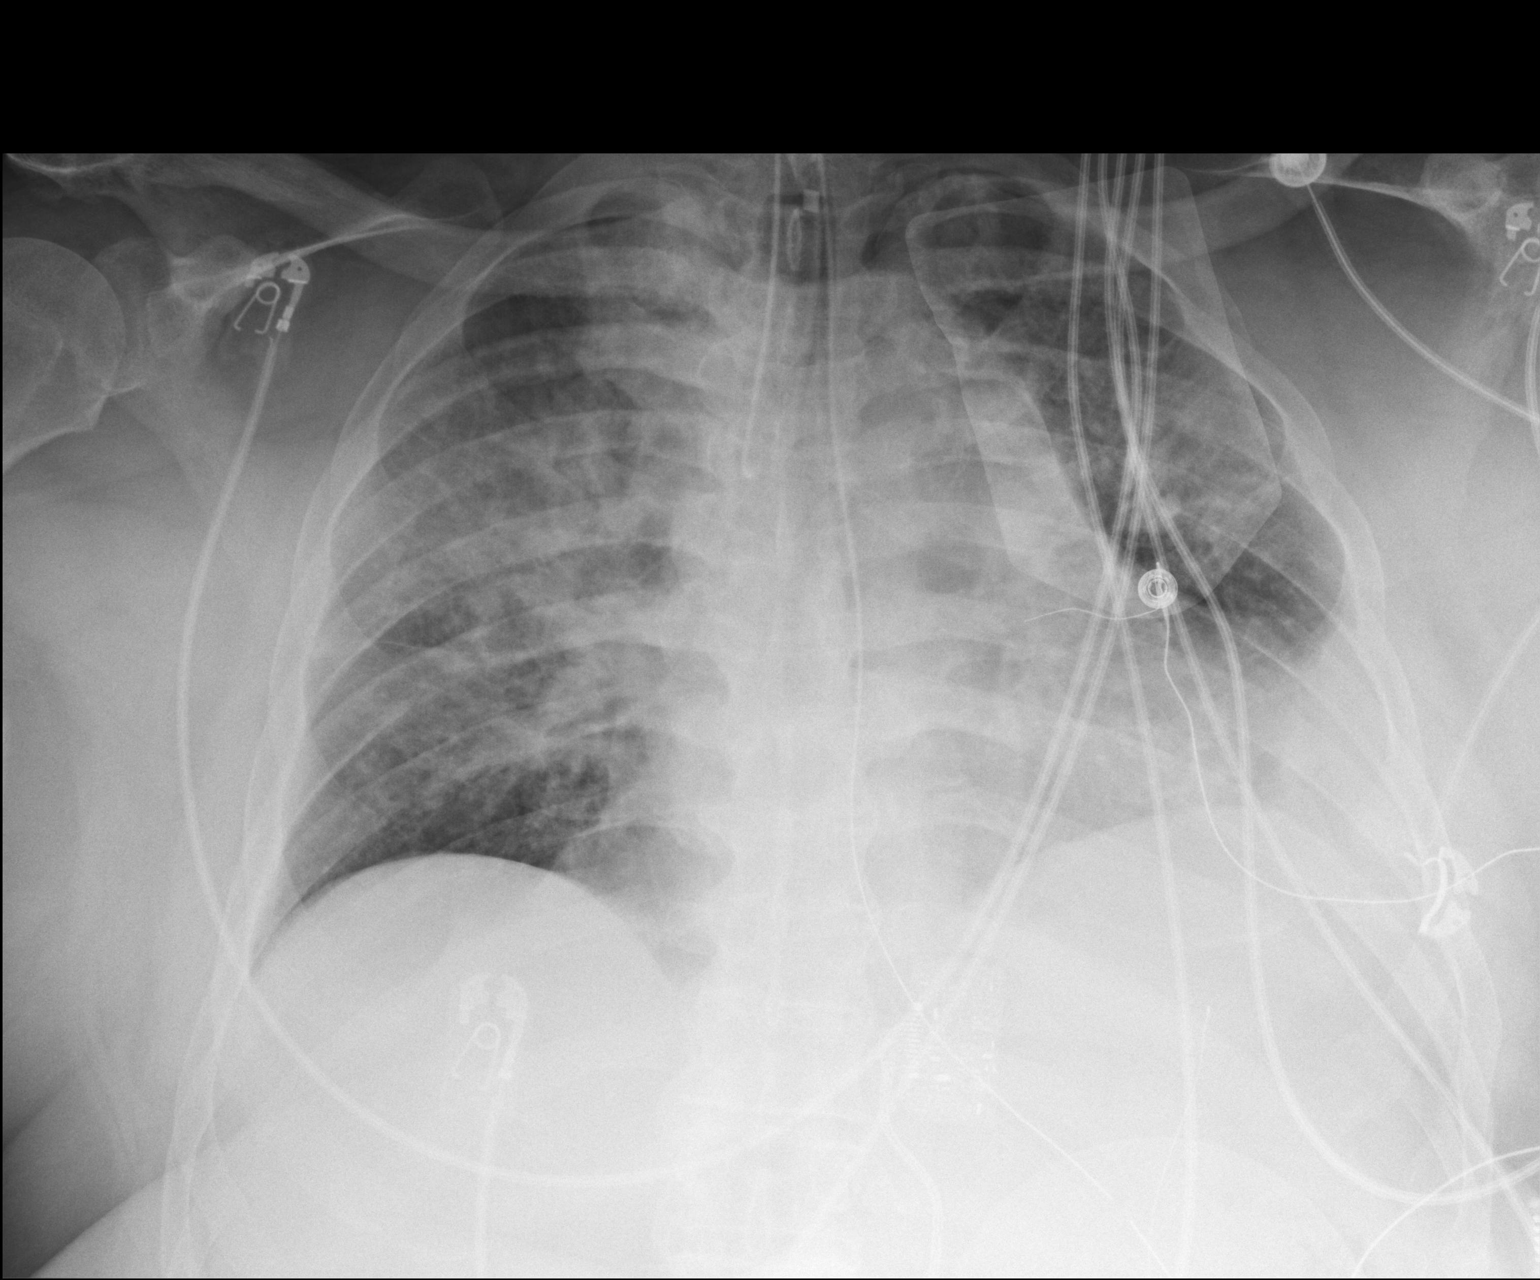

[1 of 1 positions shown; findings below may reference images not displayed]

FINDINGS: An endotracheal tube is in place with tip 1.5 cm above the carina. A
nasogastric tube is seen extending into the stomach, however, the
tip of the nasogastric tube extends below the lower margin of the
image. Transcutaneous defibrillator pads projecting over the left
hemithorax. Lung volumes are low. Patchy multifocal interstitial and
airspace disease asymmetrically distributed in the lungs
bilaterally. Probable small left pleural effusion which may be
loculated laterally. No right pleural effusion. Pulmonary
vasculature is obscured. Heart size appears mildly enlarged. Upper
mediastinal contours are partially obscured by airspace disease and
slightly distorted by patient's rotation to the left.
IMPRESSION: 1. Support apparatus, as above.
2. Multifocal interstitial and airspace disease is slightly
asymmetric. While this could represent underlying cardiogenic or
noncardiogenic edema, findings may also reflect multilobar pneumonia
or sequela of recent aspiration.
3. Probable small partially loculated left pleural effusion at the
base of the left hemithorax. Attention on followup studies is
recommended. Should this fail to resolve, further evaluation with
nonemergent contrast enhanced chest CT should be considered to
exclude underlying mass.

## 2018-01-29 ENCOUNTER — Inpatient Hospital Stay (INDEPENDENT_AMBULATORY_CARE_PROVIDER_SITE_OTHER): Payer: Medicare HMO | Admitting: Orthopaedic Surgery

## 2018-01-29 LAB — CUP PACEART REMOTE DEVICE CHECK
Battery Remaining Longevity: 132 mo
Battery Voltage: 3.04 V
Brady Statistic RV Percent Paced: 0.01 %
Date Time Interrogation Session: 20190410133523
HighPow Impedance: 64 Ohm
Implantable Lead Implant Date: 20180116
Implantable Lead Location: 753860
Implantable Lead Model: 6935
Implantable Pulse Generator Implant Date: 20180116
Lead Channel Impedance Value: 247 Ohm
Lead Channel Impedance Value: 342 Ohm
Lead Channel Pacing Threshold Amplitude: 0.75 V
Lead Channel Pacing Threshold Pulse Width: 0.4 ms
Lead Channel Sensing Intrinsic Amplitude: 11.625 mV
Lead Channel Sensing Intrinsic Amplitude: 11.625 mV
Lead Channel Setting Pacing Amplitude: 2.5 V
Lead Channel Setting Pacing Pulse Width: 0.4 ms
Lead Channel Setting Sensing Sensitivity: 0.3 mV

## 2018-02-06 ENCOUNTER — Ambulatory Visit (INDEPENDENT_AMBULATORY_CARE_PROVIDER_SITE_OTHER): Payer: Medicare HMO | Admitting: Orthopaedic Surgery

## 2018-02-06 ENCOUNTER — Encounter (INDEPENDENT_AMBULATORY_CARE_PROVIDER_SITE_OTHER): Payer: Self-pay | Admitting: Orthopaedic Surgery

## 2018-02-06 VITALS — BP 144/66 | HR 74 | Temp 98.5°F

## 2018-02-06 DIAGNOSIS — Z9889 Other specified postprocedural states: Secondary | ICD-10-CM

## 2018-02-06 NOTE — Progress Notes (Signed)
Office Visit Note   Patient: Dean [name]           Date of Birth: 1950/10/26           MRN: [id number] Visit Date: 02/06/2018              Requested by: [name], MD [id number] [place], [place] [address] PCP: [name], MD   Assessment & Plan: Visit Diagnoses:  1. Status post lumbar spine operative procedure for decompression of spinal cord     Plan: Patient returns he is 2-1/2 weeks post L2-L5 decompression multilevel.  He notices his legs feel stronger he is walking better.  Last couple days inferior aspect of his incision started having some drainage at the bottom suture and a little bit of purulence is expressed which was cultured.  I put sterile Q-tip and and no additional purulence were noted.  I was not able to express more purulence.  Will place months of doxycycline.  Previous PCR was negative when he had surgery on 01/18/2018.  I will check him back again in 6 days I discussed with him if he starts running high fever having more purulent drainage she will let us know.  He is a diabetic also has some elevated creatinine but his A1c's have been satisfactory.  He is afebrile today.  Recheck 6 days.  Doxycycline 100 mg 1 p.o. twice daily prescribed.  He will call if he has any chills or fever increased drainage.  Recheck Tuesday.  Follow-Up Instructions: Return in about 6 days (around 02/12/2018).   Orders:  No orders of the defined types were placed in this encounter.  No orders of the defined types were placed in this encounter.     Procedures: No procedures performed   Clinical Data: No additional findings.   Subjective: Chief Complaint  Patient presents with  . Lower Back - Pain    HPI follow-up lumbar decompression he is postop.  No chills or fever inferior aspect incision is drained.  Review of Systems Unchanged.  Objective: Vital Signs: BP (!) 144/66   Pulse 74   Physical Exam see above.  Ortho Exam  Specialty Comments:  No specialty  comments available.  Imaging: No results found.   PMFS History: Patient Active Problem List   Diagnosis Date Noted  . Lumbar stenosis 01/18/2018  . CAD (coronary artery disease) 12/17/2017  . Hyperlipidemia LDL goal <70 12/17/2017  . 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 CHF (congestive heart failure) ([place]) 10/10/2016  . Cardiomyopathy, ischemic   . Debility 10/04/2016  . Stage 3 chronic kidney disease ([place])   . AKI (acute kidney injury) ([place])   . Acute systolic congestive heart failure ([place])   . Disorientated   . Bacteremia   . Anoxic brain injury ([place]) 09/15/2016  . Benign essential HTN   . OSA (obstructive sleep apnea)   . Transaminitis   . Acute lower UTI   . Bacteremia due to Gram-negative bacteria   . Chronic respiratory failure with hypoxia ([place])   . Fever   . Acute cystitis without hematuria   . NSVT (nonsustained ventricular tachycardia) ([place])   . Uncomplicated asthma   . Diabetes mellitus type 2 in obese ([place])   . Acute on chronic combined systolic and diastolic CHF (congestive heart failure) ([place])   . Tachypnea   . Hypernatremia   . Hypokalemia   . Leukocytosis   . Acute blood loss anemia   . Hypoxemia   . Acute  respiratory failure ([place])   . Acute encephalopathy   . Cardiopulmonary arrest ([place]) 08/27/2016  . DM2 (diabetes mellitus, type 2) ([place]) 03/02/2014  . Morbid obesity ([place]) 03/02/2014  . Essential hypertension, benign 03/02/2014   Past Medical History:  Diagnosis Date  . 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 Arthritis    "maybe in his spine" (09/05/2016)  . Asthma    pt. denies  . Cardiac arrest ([place]) 08/27/2016   REQUIRING CPR, SHOCK & MEDICATIONS  . CHF (congestive heart failure) ([place])   . Coronary artery disease    90% ostRCA stenosis and total occlusion of dRCA with L-->R collaterals 2017  . Dysrhythmia   . Gout   . High cholesterol   . Hypertension   . Ischemic cardiomyopathy    . LBBB (left bundle branch block)   . OSA on CPAP   . Type II diabetes mellitus (HCC)     Family History  Problem Relation Age of Onset  . Heart attack Father        died in his 18's  . Hypertension Sister   . Cancer Brother        uncertain, "leg"  . Hypertension Sister     Past Surgical History:  Procedure Laterality Date  . CARDIAC CATHETERIZATION N/A 09/07/2016   Procedure: Left Heart Cath and Coronary Angiography;  Surgeon: Peter M Martinique, MD;  Location: [facility];  Service: Cardiovascular;  Laterality: N/A;  . EP IMPLANTABLE DEVICE N/A 10/10/2016   Procedure: ICD Implant;  Surgeon: Will Meredith Leeds, MD;  Location: [facility];  Service: Cardiovascular;  Laterality: N/A;  . LUMBAR LAMINECTOMY/DECOMPRESSION MICRODISCECTOMY N/A 01/18/2018   Procedure: L2-3, L3-4, L-4-5 CENTRAL DECOMPRESSION;  Surgeon: [name], MD;  Location: [facility];  Service: Orthopedics;  Laterality: N/A;   Social History   Occupational History  . Not on file  Tobacco Use  . Smoking status: Never Smoker  . Smokeless tobacco: Never Used  Substance and Sexual Activity  . Alcohol use: No  . Drug use: No  . Sexual activity: Not on file

## 2018-02-11 ENCOUNTER — Encounter (INDEPENDENT_AMBULATORY_CARE_PROVIDER_SITE_OTHER): Payer: Self-pay | Admitting: Orthopaedic Surgery

## 2018-02-11 LAB — ANAEROBIC AND AEROBIC CULTURE
MICRO NUMBER:: 90592035
MICRO NUMBER:: 90592036
SPECIMEN QUALITY:: ADEQUATE
SPECIMEN QUALITY:: ADEQUATE

## 2018-02-12 ENCOUNTER — Encounter (INDEPENDENT_AMBULATORY_CARE_PROVIDER_SITE_OTHER): Payer: Self-pay | Admitting: Orthopaedic Surgery

## 2018-02-12 ENCOUNTER — Ambulatory Visit (INDEPENDENT_AMBULATORY_CARE_PROVIDER_SITE_OTHER): Payer: Medicare HMO | Admitting: Orthopaedic Surgery

## 2018-02-12 VITALS — BP 171/73 | HR 68 | Ht 66.0 in | Wt 272.0 lb

## 2018-02-12 DIAGNOSIS — Z9889 Other specified postprocedural states: Secondary | ICD-10-CM

## 2018-02-12 MED ORDER — SULFAMETHOXAZOLE-TRIMETHOPRIM 800-160 MG PO TABS: 1.0000 | ORAL_TABLET | Freq: Two times a day (BID) | ORAL | 0 refills | Status: DC

## 2018-02-12 NOTE — Progress Notes (Signed)
Office Visit Note   Patient: Dean [name]           Date of Birth: 10-07-1950           MRN: [id number] Visit Date: 02/12/2018              Requested by: [name], MD [id number] [place], [place] [address] PCP: [name], MD   Assessment & Plan: Visit Diagnoses:  1. History of lumbar laminectomy for spinal cord decompression     Plan: Recheck 1 week.  He will call if he develops fever chills or significant increased drainage.  He will take Septra DS 1 p.o. twice daily.  Follow-Up Instructions: Return in about 1 week (around 02/19/2018).   Orders:  No orders of the defined types were placed in this encounter.  Meds ordered this encounter  Medications  . sulfamethoxazole-trimethoprim (BACTRIM DS,SEPTRA DS) 800-160 MG tablet    Sig: Take 1 tablet by mouth 2 (two) times daily.    Dispense:  60 tablet    Refill:  0      Procedures: No procedures performed   Clinical Data: No additional findings.   Subjective: Chief Complaint  Patient presents with  . Lower Back - Wound Check    HPI 67 year old male returns and cultures are available that are growing Proteus mirabilis which is sensitive to Bactrim.  Patient's also was growing MSSA.  I had originally put him on doxycycline but Proteus mirabilis was not tested against it.  Inferior half of incision has separated 2 mm.  There is only 2 mm depth of the separation and trace serous slight purulent expression but I am unable to squeeze any more fluid out and he has pictures of daily dressings adjust shows small areas of 1 or 2 cm of drainage.  We will place him on Septra DS 1 p.o. twice daily and recheck him in 1 week.  He will continue to take good care of his diabetes.  He has had problems with previous septicemia with gram-negative sepsis when he was in the hospital after pulmonary infections.  His diabetes has been under control.  I discussed and then that if he runs fever or chills or this wound splits open he will  need to go back to the operating room to have it washed out debrided and a VAC placed.  Currently he will continue doing his dressing changes and recheck in 1 week.  Review of Systems unchanged from previous visit.   Objective: Vital Signs: BP (!) 171/73   Pulse 68   Ht 5\' 6"  (1.676 m)   Wt 272 lb (123.4 kg)   BMI 43.90 kg/m   Physical Exam as described above.  Patient is amatory good relief of his neurogenic claudication symptoms.  Ortho Exam  Specialty Comments:  No specialty comments available.  Imaging: No results found.   PMFS History: Patient Active Problem List   Diagnosis Date Noted  . Lumbar stenosis 01/18/2018  . CAD (coronary artery disease) 12/17/2017  . Hyperlipidemia LDL goal <70 12/17/2017  . 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 CHF (congestive heart failure) ([place]) 10/10/2016  . Cardiomyopathy, ischemic   . Debility 10/04/2016  . Stage 3 chronic kidney disease ([place])   . AKI (acute kidney injury) ([place])   . Acute systolic congestive heart failure ([place])   . Disorientated   . Bacteremia   . Anoxic brain injury ([place]) 09/15/2016  . Benign essential HTN   . OSA (obstructive sleep apnea)   .  Transaminitis   . Acute lower UTI   . Bacteremia due to Gram-negative bacteria   . Chronic respiratory failure with hypoxia ([place])   . Fever   . Acute cystitis without hematuria   . NSVT (nonsustained ventricular tachycardia) ([place])   . Uncomplicated asthma   . Diabetes mellitus type 2 in obese ([place])   . Acute on chronic combined systolic and diastolic CHF (congestive heart failure) ([place])   . Tachypnea   . Hypernatremia   . Hypokalemia   . Leukocytosis   . Acute blood loss anemia   . Hypoxemia   . Acute respiratory failure ([place])   . Acute encephalopathy   . Cardiopulmonary arrest ([place]) 08/27/2016  . DM2 (diabetes mellitus, type 2) ([place]) 03/02/2014  . Morbid obesity ([place]) 03/02/2014  . Essential hypertension, benign 03/02/2014    Past Medical History:  Diagnosis Date  . 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 Arthritis    "maybe in his spine" (09/05/2016)  . Asthma    pt. denies  . Cardiac arrest ([place]) 08/27/2016   REQUIRING CPR, SHOCK & MEDICATIONS  . CHF (congestive heart failure) ([place])   . Coronary artery disease    90% ostRCA stenosis and total occlusion of dRCA with L-->R collaterals 2017  . Dysrhythmia   . Gout   . High cholesterol   . Hypertension   . Ischemic cardiomyopathy   . LBBB (left bundle branch block)   . OSA on CPAP   . Type II diabetes mellitus (HCC)     Family History  Problem Relation Age of Onset  . Heart attack Father        died in his 32's  . Hypertension Sister   . Cancer Brother        uncertain, "leg"  . Hypertension Sister     Past Surgical History:  Procedure Laterality Date  . CARDIAC CATHETERIZATION N/A 09/07/2016   Procedure: Left Heart Cath and Coronary Angiography;  Surgeon: Peter M Martinique, MD;  Location: [facility];  Service: Cardiovascular;  Laterality: N/A;  . EP IMPLANTABLE DEVICE N/A 10/10/2016   Procedure: ICD Implant;  Surgeon: Will Meredith Leeds, MD;  Location: [facility];  Service: Cardiovascular;  Laterality: N/A;  . LUMBAR LAMINECTOMY/DECOMPRESSION MICRODISCECTOMY N/A 01/18/2018   Procedure: L2-3, L3-4, L-4-5 CENTRAL DECOMPRESSION;  Surgeon: [name], MD;  Location: [facility];  Service: Orthopedics;  Laterality: N/A;   Social History   Occupational History  . Not on file  Tobacco Use  . Smoking status: Never Smoker  . Smokeless tobacco: Never Used  Substance and Sexual Activity  . Alcohol use: No  . Drug use: No  . Sexual activity: Not on file

## 2018-02-19 ENCOUNTER — Ambulatory Visit (INDEPENDENT_AMBULATORY_CARE_PROVIDER_SITE_OTHER): Payer: Medicare HMO | Admitting: Orthopaedic Surgery

## 2018-02-19 ENCOUNTER — Encounter (INDEPENDENT_AMBULATORY_CARE_PROVIDER_SITE_OTHER): Payer: Self-pay | Admitting: Orthopaedic Surgery

## 2018-02-19 VITALS — BP 143/64 | HR 68 | Ht 66.0 in | Wt 272.0 lb

## 2018-02-19 DIAGNOSIS — Z9889 Other specified postprocedural states: Secondary | ICD-10-CM

## 2018-02-19 NOTE — Progress Notes (Signed)
Post-Op Visit Note   Patient: Dean [name]           Date of Birth: 20-Aug-1951           MRN: [id number] Visit Date: 02/19/2018 PCP: [name], MD    Assessment & Plan: Recheck 2 weeks.  Continue antibiotics Septra DS based on sensitivities.  Minimal daily drainage and I am unable to express any significant amount of purulence.  Good relief of preop leg weakness from his multilevel decompression.  If he develops fever chills or any increased drainage she will return earlier otherwise return 2 weeks.  Chief Complaint:  Chief Complaint  Patient presents with  . Lower Back - Wound Check   Visit Diagnoses:  1. Status post lumbar spine operative procedure for decompression of spinal cord     Plan: Return 2 weeks for wound recheck.  Follow-Up Instructions: Return in about 2 weeks (around 03/05/2018).   Orders:  No orders of the defined types were placed in this encounter.  No orders of the defined types were placed in this encounter.   Imaging: No results found.  PMFS History: Patient Active Problem List   Diagnosis Date Noted  . Lumbar stenosis 01/18/2018  . CAD (coronary artery disease) 12/17/2017  . Hyperlipidemia LDL goal <70 12/17/2017  . 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 CHF (congestive heart failure) ([place]) 10/10/2016  . Cardiomyopathy, ischemic   . Debility 10/04/2016  . Stage 3 chronic kidney disease ([place])   . AKI (acute kidney injury) ([place])   . Acute systolic congestive heart failure ([place])   . Disorientated   . Bacteremia   . Anoxic brain injury ([place]) 09/15/2016  . Benign essential HTN   . OSA (obstructive sleep apnea)   . Transaminitis   . Acute lower UTI   . Bacteremia due to Gram-negative bacteria   . Chronic respiratory failure with hypoxia ([place])   . Fever   . Acute cystitis without hematuria   . NSVT (nonsustained ventricular tachycardia) ([place])   . Uncomplicated asthma   . Diabetes mellitus type 2 in obese  ([place])   . Acute on chronic combined systolic and diastolic CHF (congestive heart failure) ([place])   . Tachypnea   . Hypernatremia   . Hypokalemia   . Leukocytosis   . Acute blood loss anemia   . Hypoxemia   . Acute respiratory failure ([place])   . Acute encephalopathy   . Cardiopulmonary arrest ([place]) 08/27/2016  . DM2 (diabetes mellitus, type 2) ([place]) 03/02/2014  . Morbid obesity ([place]) 03/02/2014  . Essential hypertension, benign 03/02/2014   Past Medical History:  Diagnosis Date  . 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 Arthritis    "maybe in his spine" (09/05/2016)  . Asthma    pt. denies  . Cardiac arrest ([place]) 08/27/2016   REQUIRING CPR, SHOCK & MEDICATIONS  . CHF (congestive heart failure) ([place])   . Coronary artery disease    90% ostRCA stenosis and total occlusion of dRCA with L-->R collaterals 2017  . Dysrhythmia   . Gout   . High cholesterol   . Hypertension   . Ischemic cardiomyopathy   . LBBB (left bundle branch block)   . OSA on CPAP   . Type II diabetes mellitus (HCC)     Family History  Problem Relation Age of Onset  . Heart attack Father        died in his 52's  . Hypertension Sister   . Cancer Brother  uncertain, "leg"  . Hypertension Sister     Past Surgical History:  Procedure Laterality Date  . CARDIAC CATHETERIZATION N/A 09/07/2016   Procedure: Left Heart Cath and Coronary Angiography;  Surgeon: Peter M Martinique, MD;  Location: [facility];  Service: Cardiovascular;  Laterality: N/A;  . EP IMPLANTABLE DEVICE N/A 10/10/2016   Procedure: ICD Implant;  Surgeon: Will Meredith Leeds, MD;  Location: [facility];  Service: Cardiovascular;  Laterality: N/A;  . LUMBAR LAMINECTOMY/DECOMPRESSION MICRODISCECTOMY N/A 01/18/2018   Procedure: L2-3, L3-4, L-4-5 CENTRAL DECOMPRESSION;  Surgeon: [name], MD;  Location: [facility];  Service: Orthopedics;  Laterality: N/A;   Social History   Occupational History  . Not on  file  Tobacco Use  . Smoking status: Never Smoker  . Smokeless tobacco: Never Used  Substance and Sexual Activity  . Alcohol use: No  . Drug use: No  . Sexual activity: Not on file

## 2018-03-08 ENCOUNTER — Encounter (INDEPENDENT_AMBULATORY_CARE_PROVIDER_SITE_OTHER): Payer: Self-pay | Admitting: Orthopaedic Surgery

## 2018-03-08 ENCOUNTER — Ambulatory Visit (INDEPENDENT_AMBULATORY_CARE_PROVIDER_SITE_OTHER): Payer: Medicare HMO | Admitting: Orthopaedic Surgery

## 2018-03-08 VITALS — BP 179/82 | HR 84 | Ht 66.0 in | Wt 272.0 lb

## 2018-03-08 DIAGNOSIS — Z9889 Other specified postprocedural states: Secondary | ICD-10-CM

## 2018-03-08 NOTE — Progress Notes (Signed)
Post-Op Visit Note   Patient: Dean [name]           Date of Birth: 19-Aug-1951           MRN: [id number] Visit Date: 03/08/2018 PCP: [name], MD   Assessment & Plan:  Chief Complaint:  Chief Complaint  Patient presents with  . Lower Back - Wound Check   Visit Diagnoses:  1. History of lumbar laminectomy for spinal cord decompression     Plan: He can finish out his antibiotics and stop.  There is only 2 or 3 small spot areas where he had trace serous drainage on the dressing from yesterday.  He has been on doxycycline twice a day for several weeks.  I plan to recheck of her final visit in 2 months.  He is noticed relief on his leg pain he can stand longer and walk further than he could before the surgery and he is very happy with that. Surgery date 01/18/18 L2 thru L5 decompression.   Follow-Up Instructions: Return in about 2 months (around 05/08/2018).   Orders:  No orders of the defined types were placed in this encounter.  No orders of the defined types were placed in this encounter.   Imaging: No results found.  PMFS History: Patient Active Problem List   Diagnosis Date Noted  . CAD (coronary artery disease) 12/17/2017  . Hyperlipidemia LDL goal <70 12/17/2017  . 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 CHF (congestive heart failure) ([place]) 10/10/2016  . Cardiomyopathy, ischemic   . Debility 10/04/2016  . Stage 3 chronic kidney disease ([place])   . AKI (acute kidney injury) ([place])   . Acute systolic congestive heart failure ([place])   . Disorientated   . Bacteremia   . Anoxic brain injury ([place]) 09/15/2016  . Benign essential HTN   . OSA (obstructive sleep apnea)   . Transaminitis   . Acute lower UTI   . Bacteremia due to Gram-negative bacteria   . Chronic respiratory failure with hypoxia ([place])   . Fever   . Acute cystitis without hematuria   . NSVT (nonsustained ventricular tachycardia) ([place])   . Uncomplicated asthma   . Diabetes  mellitus type 2 in obese ([place])   . Acute on chronic combined systolic and diastolic CHF (congestive heart failure) ([place])   . Tachypnea   . Hypernatremia   . Hypokalemia   . Leukocytosis   . Acute blood loss anemia   . Hypoxemia   . Acute respiratory failure ([place])   . Acute encephalopathy   . Cardiopulmonary arrest ([place]) 08/27/2016  . DM2 (diabetes mellitus, type 2) ([place]) 03/02/2014  . Morbid obesity ([place]) 03/02/2014  . Essential hypertension, benign 03/02/2014   Past Medical History:  Diagnosis Date  . 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 Arthritis    "maybe in his spine" (09/05/2016)  . Asthma    pt. denies  . Cardiac arrest ([place]) 08/27/2016   REQUIRING CPR, SHOCK & MEDICATIONS  . CHF (congestive heart failure) ([place])   . Coronary artery disease    90% ostRCA stenosis and total occlusion of dRCA with L-->R collaterals 2017  . Dysrhythmia   . Gout   . High cholesterol   . Hypertension   . Ischemic cardiomyopathy   . LBBB (left bundle branch block)   . OSA on CPAP   . Type II diabetes mellitus (HCC)     Family History  Problem Relation Age of Onset  . Heart attack Father  died in his 63's  . Hypertension Sister   . Cancer Brother        uncertain, "leg"  . Hypertension Sister     Past Surgical History:  Procedure Laterality Date  . CARDIAC CATHETERIZATION N/A 09/07/2016   Procedure: Left Heart Cath and Coronary Angiography;  Surgeon: Peter M Martinique, MD;  Location: [facility];  Service: Cardiovascular;  Laterality: N/A;  . EP IMPLANTABLE DEVICE N/A 10/10/2016   Procedure: ICD Implant;  Surgeon: Will Meredith Leeds, MD;  Location: [facility];  Service: Cardiovascular;  Laterality: N/A;  . LUMBAR LAMINECTOMY/DECOMPRESSION MICRODISCECTOMY N/A 01/18/2018   Procedure: L2-3, L3-4, L-4-5 CENTRAL DECOMPRESSION;  Surgeon: [name], MD;  Location: [facility];  Service: Orthopedics;  Laterality: N/A;   Social History    Occupational History  . Not on file  Tobacco Use  . Smoking status: Never Smoker  . Smokeless tobacco: Never Used  Substance and Sexual Activity  . Alcohol use: No  . Drug use: No  . Sexual activity: Not on file

## 2018-04-03 ENCOUNTER — Ambulatory Visit (INDEPENDENT_AMBULATORY_CARE_PROVIDER_SITE_OTHER): Payer: Medicare HMO | Admitting: *Deleted

## 2018-04-03 DIAGNOSIS — I255 Ischemic cardiomyopathy: Secondary | ICD-10-CM

## 2018-04-03 NOTE — Progress Notes (Signed)
Remote ICD transmission.   

## 2018-05-02 LAB — CUP PACEART REMOTE DEVICE CHECK
Battery Remaining Longevity: 130 mo
Battery Voltage: 3.04 V
Brady Statistic RV Percent Paced: 0.02 %
Date Time Interrogation Session: 20190710052403
HighPow Impedance: 65 Ohm
Implantable Lead Implant Date: 20180116
Implantable Lead Location: 753860
Implantable Lead Model: 6935
Implantable Pulse Generator Implant Date: 20180116
Lead Channel Impedance Value: 266 Ohm
Lead Channel Impedance Value: 342 Ohm
Lead Channel Pacing Threshold Amplitude: 0.75 V
Lead Channel Pacing Threshold Pulse Width: 0.4 ms
Lead Channel Sensing Intrinsic Amplitude: 10.625 mV
Lead Channel Sensing Intrinsic Amplitude: 10.625 mV
Lead Channel Setting Pacing Amplitude: 2.5 V
Lead Channel Setting Pacing Pulse Width: 0.4 ms
Lead Channel Setting Sensing Sensitivity: 0.3 mV

## 2018-05-10 ENCOUNTER — Ambulatory Visit (INDEPENDENT_AMBULATORY_CARE_PROVIDER_SITE_OTHER): Payer: Medicare HMO | Admitting: Orthopaedic Surgery

## 2018-05-13 ENCOUNTER — Other Ambulatory Visit: Payer: Self-pay | Admitting: Internal Medicine

## 2018-05-13 DIAGNOSIS — M79606 Pain in leg, unspecified: Secondary | ICD-10-CM

## 2018-05-15 ENCOUNTER — Inpatient Hospital Stay (HOSPITAL_COMMUNITY)
Admission: EM | Admit: 2018-05-15 | Discharge: 2018-05-21 | DRG: 291 | Disposition: A | Discharge status: Home / Self Care | Payer: Medicare HMO | Attending: Internal Medicine | Admitting: Internal Medicine

## 2018-05-15 ENCOUNTER — Encounter (HOSPITAL_COMMUNITY): Payer: Self-pay | Admitting: *Deleted

## 2018-05-15 ENCOUNTER — Emergency Department (HOSPITAL_COMMUNITY): Payer: Medicare HMO

## 2018-05-15 DIAGNOSIS — E785 Hyperlipidemia, unspecified: Secondary | ICD-10-CM | POA: Diagnosis present

## 2018-05-15 DIAGNOSIS — M109 Gout, unspecified: Secondary | ICD-10-CM | POA: Diagnosis present

## 2018-05-15 DIAGNOSIS — E876 Hypokalemia: Secondary | ICD-10-CM | POA: Diagnosis present

## 2018-05-15 DIAGNOSIS — Z8674 Personal history of sudden cardiac arrest: Secondary | ICD-10-CM | POA: Diagnosis not present

## 2018-05-15 DIAGNOSIS — I447 Left bundle-branch block, unspecified: Secondary | ICD-10-CM | POA: Diagnosis present

## 2018-05-15 DIAGNOSIS — J9621 Acute and chronic respiratory failure with hypoxia: Secondary | ICD-10-CM | POA: Diagnosis present

## 2018-05-15 DIAGNOSIS — E669 Obesity, unspecified: Secondary | ICD-10-CM | POA: Diagnosis not present

## 2018-05-15 DIAGNOSIS — I255 Ischemic cardiomyopathy: Secondary | ICD-10-CM | POA: Diagnosis present

## 2018-05-15 DIAGNOSIS — D631 Anemia in chronic kidney disease: Secondary | ICD-10-CM | POA: Diagnosis present

## 2018-05-15 DIAGNOSIS — Z6841 Body Mass Index (BMI) 40.0 and over, adult: Secondary | ICD-10-CM

## 2018-05-15 DIAGNOSIS — G4733 Obstructive sleep apnea (adult) (pediatric): Secondary | ICD-10-CM | POA: Diagnosis present

## 2018-05-15 DIAGNOSIS — N183 Chronic kidney disease, stage 3 unspecified: Secondary | ICD-10-CM | POA: Diagnosis present

## 2018-05-15 DIAGNOSIS — I2510 Atherosclerotic heart disease of native coronary artery without angina pectoris: Secondary | ICD-10-CM | POA: Diagnosis present

## 2018-05-15 DIAGNOSIS — I2582 Chronic total occlusion of coronary artery: Secondary | ICD-10-CM | POA: Diagnosis present

## 2018-05-15 DIAGNOSIS — E1122 Type 2 diabetes mellitus with diabetic chronic kidney disease: Secondary | ICD-10-CM | POA: Diagnosis present

## 2018-05-15 DIAGNOSIS — J45909 Unspecified asthma, uncomplicated: Secondary | ICD-10-CM | POA: Diagnosis present

## 2018-05-15 DIAGNOSIS — Z6835 Body mass index (BMI) 35.0-35.9, adult: Secondary | ICD-10-CM | POA: Diagnosis present

## 2018-05-15 DIAGNOSIS — I248 Other forms of acute ischemic heart disease: Secondary | ICD-10-CM | POA: Diagnosis present

## 2018-05-15 DIAGNOSIS — I130 Hypertensive heart and chronic kidney disease with heart failure and stage 1 through stage 4 chronic kidney disease, or unspecified chronic kidney disease: Principal | ICD-10-CM | POA: Diagnosis present

## 2018-05-15 DIAGNOSIS — D649 Anemia, unspecified: Secondary | ICD-10-CM | POA: Diagnosis present

## 2018-05-15 DIAGNOSIS — E118 Type 2 diabetes mellitus with unspecified complications: Secondary | ICD-10-CM | POA: Diagnosis not present

## 2018-05-15 DIAGNOSIS — Z888 Allergy status to other drugs, medicaments and biological substances status: Secondary | ICD-10-CM

## 2018-05-15 DIAGNOSIS — I509 Heart failure, unspecified: Secondary | ICD-10-CM | POA: Insufficient documentation

## 2018-05-15 DIAGNOSIS — Z9114 Patient's other noncompliance with medication regimen: Secondary | ICD-10-CM | POA: Diagnosis not present

## 2018-05-15 DIAGNOSIS — I080 Rheumatic disorders of both mitral and aortic valves: Secondary | ICD-10-CM | POA: Diagnosis present

## 2018-05-15 DIAGNOSIS — Z91041 Radiographic dye allergy status: Secondary | ICD-10-CM

## 2018-05-15 DIAGNOSIS — Z8249 Family history of ischemic heart disease and other diseases of the circulatory system: Secondary | ICD-10-CM | POA: Diagnosis not present

## 2018-05-15 DIAGNOSIS — M1990 Unspecified osteoarthritis, unspecified site: Secondary | ICD-10-CM | POA: Diagnosis present

## 2018-05-15 DIAGNOSIS — E6609 Other obesity due to excess calories: Secondary | ICD-10-CM | POA: Diagnosis present

## 2018-05-15 DIAGNOSIS — I160 Hypertensive urgency: Secondary | ICD-10-CM | POA: Diagnosis present

## 2018-05-15 DIAGNOSIS — E1169 Type 2 diabetes mellitus with other specified complication: Secondary | ICD-10-CM | POA: Diagnosis not present

## 2018-05-15 DIAGNOSIS — E119 Type 2 diabetes mellitus without complications: Secondary | ICD-10-CM

## 2018-05-15 DIAGNOSIS — I5043 Acute on chronic combined systolic (congestive) and diastolic (congestive) heart failure: Secondary | ICD-10-CM | POA: Diagnosis present

## 2018-05-15 DIAGNOSIS — R748 Abnormal levels of other serum enzymes: Secondary | ICD-10-CM | POA: Diagnosis not present

## 2018-05-15 DIAGNOSIS — J189 Pneumonia, unspecified organism: Secondary | ICD-10-CM | POA: Diagnosis present

## 2018-05-15 DIAGNOSIS — I361 Nonrheumatic tricuspid (valve) insufficiency: Secondary | ICD-10-CM | POA: Diagnosis not present

## 2018-05-15 DIAGNOSIS — Z95810 Presence of automatic (implantable) cardiac defibrillator: Secondary | ICD-10-CM | POA: Diagnosis not present

## 2018-05-15 DIAGNOSIS — Z7982 Long term (current) use of aspirin: Secondary | ICD-10-CM

## 2018-05-15 LAB — URINALYSIS, ROUTINE W REFLEX MICROSCOPIC
Bilirubin Urine: NEGATIVE
Glucose, UA: NEGATIVE mg/dL
Hgb urine dipstick: NEGATIVE
Ketones, ur: NEGATIVE mg/dL
Nitrite: NEGATIVE
Protein, ur: 100 mg/dL — AB
Specific Gravity, Urine: 1.008 (ref 1.005–1.030)
pH: 6 (ref 5.0–8.0)

## 2018-05-15 LAB — CBC
HCT: 36.4 % — ABNORMAL LOW (ref 39.0–52.0)
Hemoglobin: 10.7 g/dL — ABNORMAL LOW (ref 13.0–17.0)
MCH: 26.6 pg (ref 26.0–34.0)
MCHC: 29.4 g/dL — ABNORMAL LOW (ref 30.0–36.0)
MCV: 90.5 fL (ref 78.0–100.0)
Platelets: 223 10*3/uL (ref 150–400)
RBC: 4.02 MIL/uL — ABNORMAL LOW (ref 4.22–5.81)
RDW: 15.5 % (ref 11.5–15.5)
WBC: 10.7 10*3/uL — ABNORMAL HIGH (ref 4.0–10.5)

## 2018-05-15 LAB — TROPONIN I: Troponin I: 0.08 ng/mL (ref <0.03)

## 2018-05-15 LAB — BASIC METABOLIC PANEL WITH GFR
Anion gap: 10 (ref 5–15)
BUN: 17 mg/dL (ref 8–23)
CO2: 23 mmol/L (ref 22–32)
Calcium: 9.9 mg/dL (ref 8.9–10.3)
Chloride: 107 mmol/L (ref 98–111)
Creatinine, Ser: 1.29 mg/dL — ABNORMAL HIGH (ref 0.61–1.24)
GFR calc Af Amer: >60 mL/min
GFR calc non Af Amer: 55 mL/min — ABNORMAL LOW
Glucose, Bld: 129 mg/dL — ABNORMAL HIGH (ref 70–99)
Potassium: 3.4 mmol/L — ABNORMAL LOW (ref 3.5–5.1)
Sodium: 140 mmol/L (ref 135–145)

## 2018-05-15 LAB — BRAIN NATRIURETIC PEPTIDE: B Natriuretic Peptide: 679.6 pg/mL — ABNORMAL HIGH (ref 0.0–100.0)

## 2018-05-15 LAB — I-STAT TROPONIN, ED
Troponin i, poc: 0.04 ng/mL (ref 0.00–0.08)
Troponin i, poc: 0.04 ng/mL (ref 0.00–0.08)

## 2018-05-15 LAB — STREP PNEUMONIAE URINARY ANTIGEN: Strep Pneumo Urinary Antigen: NEGATIVE

## 2018-05-15 MED ORDER — HYDRALAZINE HCL 20 MG/ML IJ SOLN: 10.0000 mg | INTRAMUSCULAR | Status: DC | PRN

## 2018-05-15 MED ORDER — ACETAMINOPHEN 325 MG PO TABS: 650.0000 mg | ORAL_TABLET | ORAL | Status: DC | PRN

## 2018-05-15 MED ORDER — ENOXAPARIN SODIUM 40 MG/0.4ML ~~LOC~~ SOLN
40.0000 mg | SUBCUTANEOUS | Status: DC
  Administered 2018-05-16 – 2018-05-20 (×6): 40 mg via SUBCUTANEOUS
  Filled 2018-05-15 (×6): qty 0.4

## 2018-05-15 MED ORDER — FUROSEMIDE 10 MG/ML IJ SOLN
40.0000 mg | Freq: Two times a day (BID) | INTRAMUSCULAR | Status: DC
  Administered 2018-05-16 – 2018-05-19 (×7): 40 mg via INTRAVENOUS
  Filled 2018-05-15 (×7): qty 4

## 2018-05-15 MED ORDER — POTASSIUM CHLORIDE CRYS ER 20 MEQ PO TBCR
20.0000 meq | EXTENDED_RELEASE_TABLET | Freq: Every day | ORAL | Status: DC
  Administered 2018-05-16 – 2018-05-21 (×6): 20 meq via ORAL
  Filled 2018-05-15 (×6): qty 1

## 2018-05-15 MED ORDER — MAGNESIUM OXIDE 400 (241.3 MG) MG PO TABS
200.0000 mg | ORAL_TABLET | Freq: Two times a day (BID) | ORAL | Status: DC
  Administered 2018-05-15 – 2018-05-21 (×12): 200 mg via ORAL
  Filled 2018-05-15 (×13): qty 1

## 2018-05-15 MED ORDER — SODIUM CHLORIDE 0.9 % IV SOLN: 250.0000 mL | INTRAVENOUS | Status: DC | PRN

## 2018-05-15 MED ORDER — CARVEDILOL 25 MG PO TABS
25.0000 mg | ORAL_TABLET | Freq: Two times a day (BID) | ORAL | Status: DC
  Administered 2018-05-16 – 2018-05-21 (×11): 25 mg via ORAL
  Filled 2018-05-15 (×3): qty 1
  Filled 2018-05-15 (×2): qty 2
  Filled 2018-05-15 (×6): qty 1

## 2018-05-15 MED ORDER — ASPIRIN EC 81 MG PO TBEC
81.0000 mg | DELAYED_RELEASE_TABLET | Freq: Every day | ORAL | Status: DC
  Administered 2018-05-15 – 2018-05-21 (×7): 81 mg via ORAL
  Filled 2018-05-15 (×7): qty 1

## 2018-05-15 MED ORDER — SODIUM CHLORIDE 0.9% FLUSH: 3.0000 mL | INTRAVENOUS | Status: DC | PRN

## 2018-05-15 MED ORDER — AMIODARONE HCL 200 MG PO TABS
200.0000 mg | ORAL_TABLET | Freq: Every day | ORAL | Status: DC
  Administered 2018-05-15 – 2018-05-21 (×7): 200 mg via ORAL
  Filled 2018-05-15 (×7): qty 1

## 2018-05-15 MED ORDER — IPRATROPIUM-ALBUTEROL 0.5-2.5 (3) MG/3ML IN SOLN: 3.0000 mL | RESPIRATORY_TRACT | Status: DC | PRN

## 2018-05-15 MED ORDER — POTASSIUM CHLORIDE CRYS ER 20 MEQ PO TBCR
40.0000 meq | EXTENDED_RELEASE_TABLET | ORAL | Status: AC
  Administered 2018-05-15: 40 meq via ORAL
  Filled 2018-05-15: qty 2

## 2018-05-15 MED ORDER — SODIUM CHLORIDE 0.9% FLUSH
3.0000 mL | Freq: Two times a day (BID) | INTRAVENOUS | Status: DC
  Administered 2018-05-15 – 2018-05-21 (×11): 3 mL via INTRAVENOUS

## 2018-05-15 MED ORDER — SODIUM CHLORIDE 0.9 % IV SOLN
2.0000 g | Freq: Every day | INTRAVENOUS | Status: DC
  Administered 2018-05-16 – 2018-05-18 (×3): 2 g via INTRAVENOUS
  Filled 2018-05-15 (×3): qty 20

## 2018-05-15 MED ORDER — ENALAPRILAT 1.25 MG/ML IV SOLN
1.2500 mg | Freq: Once | INTRAVENOUS | Status: AC
  Administered 2018-05-15: 1.25 mg via INTRAVENOUS
  Filled 2018-05-15: qty 1

## 2018-05-15 MED ORDER — AZITHROMYCIN 500 MG PO TABS
500.0000 mg | ORAL_TABLET | Freq: Every day | ORAL | Status: DC
  Administered 2018-05-16 – 2018-05-18 (×3): 500 mg via ORAL
  Filled 2018-05-15 (×3): qty 1

## 2018-05-15 MED ORDER — ALLOPURINOL 300 MG PO TABS
300.0000 mg | ORAL_TABLET | Freq: Every day | ORAL | Status: DC
  Administered 2018-05-16 – 2018-05-21 (×6): 300 mg via ORAL
  Filled 2018-05-15 (×6): qty 1

## 2018-05-15 MED ORDER — FUROSEMIDE 10 MG/ML IJ SOLN
40.0000 mg | Freq: Once | INTRAMUSCULAR | Status: AC
  Administered 2018-05-15: 40 mg via INTRAVENOUS
  Filled 2018-05-15: qty 4

## 2018-05-15 MED ORDER — POTASSIUM CHLORIDE CRYS ER 20 MEQ PO TBCR
30.0000 meq | EXTENDED_RELEASE_TABLET | Freq: Once | ORAL | Status: AC
  Administered 2018-05-15: 30 meq via ORAL
  Filled 2018-05-15: qty 2

## 2018-05-15 MED ORDER — SODIUM CHLORIDE 0.9 % IV SOLN
2.0000 g | Freq: Once | INTRAVENOUS | Status: AC
  Administered 2018-05-15: 2 g via INTRAVENOUS
  Filled 2018-05-15: qty 20

## 2018-05-15 MED ORDER — NITROGLYCERIN 0.4 MG SL SUBL
0.4000 mg | SUBLINGUAL_TABLET | SUBLINGUAL | Status: AC
  Filled 2018-05-15: qty 1

## 2018-05-15 MED ORDER — ONDANSETRON HCL 4 MG/2ML IJ SOLN: 4.0000 mg | Freq: Four times a day (QID) | INTRAMUSCULAR | Status: DC | PRN

## 2018-05-15 MED ORDER — ATORVASTATIN CALCIUM 40 MG PO TABS
40.0000 mg | ORAL_TABLET | Freq: Every day | ORAL | Status: DC
  Administered 2018-05-16 – 2018-05-20 (×5): 40 mg via ORAL
  Filled 2018-05-15 (×5): qty 1

## 2018-05-15 MED ORDER — LOSARTAN POTASSIUM 50 MG PO TABS
100.0000 mg | ORAL_TABLET | Freq: Every day | ORAL | Status: DC
  Administered 2018-05-15 – 2018-05-20 (×6): 100 mg via ORAL
  Filled 2018-05-15 (×6): qty 2

## 2018-05-15 MED ORDER — AZITHROMYCIN 250 MG PO TABS
500.0000 mg | ORAL_TABLET | Freq: Once | ORAL | Status: AC
  Administered 2018-05-15: 500 mg via ORAL
  Filled 2018-05-15: qty 2

## 2018-05-15 MED ORDER — FUROSEMIDE 10 MG/ML IJ SOLN: 40.0000 mg | Freq: Two times a day (BID) | INTRAMUSCULAR | Status: DC

## 2018-05-15 NOTE — ED Triage Notes (Signed)
Pt in c/o increased shortness of breath in the last few days, also increased swelling to feet and ankles, pt is worried he is getting excess fluid again, no distress at this time

## 2018-05-15 NOTE — ED Provider Notes (Signed)
[facility] EMERGENCY DEPARTMENT Provider Note   CSN: [id number] Arrival date & time: 05/15/18  0935     History   Chief Complaint Chief Complaint  Patient presents with  . Shortness of Breath    HPI Dean [name] is a 67 y.o. male with a history of cardiac arrest status post AICD placement, CHF, diabetes, and CAD who presents due to 2 weeks of worsening shortness of breath.  He reports having worsening leg swelling during this period.  He says that he has missed a few of his home doses of Lasix.  He denies having any chest pain.  He is concerned this may be his CHF.  He reports worsening difficulty with lying flat.  He says that he does wear CPAP at night.  He reports having a productive cough.  He also reports having subjective fever and chills at home.  He has no history of VTE.  HPI  Past Medical History:  Diagnosis Date  . 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 Arthritis    "maybe in his spine" (09/05/2016)  . Asthma    pt. denies  . Cardiac arrest ([place]) 08/27/2016   REQUIRING CPR, SHOCK & MEDICATIONS  . CHF (congestive heart failure) ([place])   . Coronary artery disease    90% ostRCA stenosis and total occlusion of dRCA with L-->R collaterals 2017  . Dysrhythmia   . Gout   . High cholesterol   . Hypertension   . Ischemic cardiomyopathy   . LBBB (left bundle branch block)   . OSA on CPAP   . Type II diabetes mellitus [facility])     Patient Active Problem List   Diagnosis Date Noted  . CAD (coronary artery disease) 12/17/2017  . Hyperlipidemia LDL goal <70 12/17/2017  . 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 CHF (congestive heart failure) ([place]) 10/10/2016  . Cardiomyopathy, ischemic   . Debility 10/04/2016  . Stage 3 chronic kidney disease ([place])   . AKI (acute kidney injury) ([place])   . Acute systolic congestive heart failure ([place])   . Disorientated   . Bacteremia   . Anoxic brain injury  ([place]) 09/15/2016  . Benign essential HTN   . OSA (obstructive sleep apnea)   . Transaminitis   . Acute lower UTI   . Bacteremia due to Gram-negative bacteria   . Chronic respiratory failure with hypoxia ([place])   . Fever   . Acute cystitis without hematuria   . NSVT (nonsustained ventricular tachycardia) ([place])   . Uncomplicated asthma   . Diabetes mellitus type 2 in obese ([place])   . Acute on chronic combined systolic and diastolic CHF (congestive heart failure) ([place])   . Tachypnea   . Hypernatremia   . Hypokalemia   . Leukocytosis   . Acute blood loss anemia   . Hypoxemia   . Acute respiratory failure ([place])   . Acute encephalopathy   . Cardiopulmonary arrest ([place]) 08/27/2016  . DM2 (diabetes mellitus, type 2) ([place]) 03/02/2014  . Morbid obesity ([place]) 03/02/2014  . Essential hypertension, benign 03/02/2014    Past Surgical History:  Procedure Laterality Date  . CARDIAC CATHETERIZATION N/A 09/07/2016   Procedure: Left Heart Cath and Coronary Angiography;  Surgeon: Peter M Martinique, MD;  Location: [facility];  Service: Cardiovascular;  Laterality: N/A;  . EP IMPLANTABLE DEVICE N/A 10/10/2016   Procedure: ICD Implant;  Surgeon: Will Meredith Leeds, MD;  Location: [facility];  Service: Cardiovascular;  Laterality: N/A;  .  LUMBAR LAMINECTOMY/DECOMPRESSION MICRODISCECTOMY N/A 01/18/2018   Procedure: L2-3, L3-4, L-4-5 CENTRAL DECOMPRESSION;  Surgeon: [name], MD;  Location: [facility];  Service: Orthopedics;  Laterality: N/A;        Home Medications    Prior to Admission medications   Medication Sig Start Date End Date Taking? Authorizing Provider  allopurinol (ZYLOPRIM) 300 MG tablet Take 300 mg by mouth daily. 08/16/16  Yes [provider]  amiodarone (PACERONE) 200 MG tablet TAKE 1 TABLET BY MOUTH EVERY DAY Patient taking differently: Take 200 mg by mouth daily.  09/04/17  Yes Camnitz, Ocie Doyne, MD  aspirin EC 81 MG EC tablet Take 1 tablet (81 mg total)  by mouth daily. 09/16/16  Yes Elgergawy, Silver Huguenin, MD  atorvastatin (LIPITOR) 40 MG tablet Take 1 tablet (40 mg total) by mouth daily at 6 PM. 10/27/16  Yes Camnitz, Will Hassell Done, MD  carvedilol (COREG) 25 MG tablet TAKE 1 TABLET (25 MG TOTAL) BY MOUTH 2 (TWO) TIMES DAILY WITH A MEAL. 05/24/17  Yes Strader, [place] M, PA-C  [employer] VITAMIN D3 1000 units capsule Take 1,000 Units by mouth daily. 08/16/16  Yes [provider]  furosemide (LASIX) 40 MG tablet Take 40 mg by mouth 2 (two) times daily.  06/07/17  Yes [provider]  KLOR-CON M20 20 MEQ tablet TAKE 1 TABLET BY MOUTH EVERY DAY Patient taking differently: Take 20 mEq by mouth daily.  09/04/17  Yes Camnitz, Will Hassell Done, MD  losartan (COZAAR) 100 MG tablet Take 1 tablet (100 mg total) by mouth daily. 07/04/17 10/06/24 Yes Camnitz, Ocie Doyne, MD  magnesium oxide (MAG-OX) 400 MG tablet Take 200 mg by mouth 2 (two) times daily.   Yes [provider]  metFORMIN (GLUCOPHAGE) 500 MG tablet Take 1 tablet (500 mg total) by mouth daily with breakfast. Patient taking differently: Take 500 mg by mouth 2 (two) times daily with a meal.  09/21/16  Yes Angiulli, Lavon Paganini, PA-C    Family History Family History  Problem Relation Age of Onset  . Heart attack Father        died in his 57's  . Hypertension Sister   . Cancer Brother        uncertain, "leg"  . Hypertension Sister     Social History Social History   Tobacco Use  . Smoking status: Never Smoker  . Smokeless tobacco: Never Used  Substance Use Topics  . Alcohol use: No  . Drug use: No     Allergies   Contrast media [iodinated diagnostic agents] and Lisinopril   Review of Systems Review of Systems Review of Systems   Constitutional  +for fever  +for chills  HENT  Negative for ear pain  Negative for sore throat  Negative for difficultly swallowing  Eyes  Negative for eye pain  Negative for visual disturbance  Respiratory  +for shortness of  breath  +for cough  CV  Negative for chest pain  +for leg swelling  Abdomen  Negative for abdominal pain  Negative for nausea  Negative for vomiting  MSK  Negative for extremity pain  Negative for back pain  Skin  Negative for rash  Negative for wound  Neuro  Negative for syncope  Negative for difficultly speaking  Psych  Negative for confusion   The remainder of the ROS was reviewed and negative except as documented above.      Physical Exam Updated Vital Signs BP (!) 179/79   Pulse 95   Temp 97.8  F (36.6 C)   Resp (!) 32   Ht 5\' 6"  (1.676 m)   Wt 122 kg   SpO2 96%   BMI 43.42 kg/m   Physical Exam Physical Exam Constitutional  Nursing notes reviewed  Vital signs reviewed  HEENT  No obvious trauma  Supple without meningismus, mass, or overt JVD  EOMI  No scleral icterus or injection  Respiratory  Crackles in the posterior fields bilaterally  Mildly dyspneic and tachypneic  CV  Tachycardic  No obvious murmurs  Extremities warm and swollen  2+ pitting edema bilaterally  Elevated JVD  Equal pulses in all extremities  Abdomen  Soft  Non-tender  Non-distended  No peritonitis  MSK  Atraumatic  No obvious deformity  ROM appropriate  Skin  Warm  Dry  2+ pitting edema  Neuro  Awake and alert  EOMI  Moving all extremities  Psychiatric  Mood and affect normal        ED Treatments / Results  Labs (all labs ordered are listed, but only abnormal results are displayed) Labs Reviewed  BASIC METABOLIC PANEL - Abnormal; Notable for the following components:      Result Value   Potassium 3.4 (*)    Glucose, Bld 129 (*)    Creatinine, Ser 1.29 (*)    GFR calc non Af Amer 55 (*)    All other components within normal limits  CBC - Abnormal; Notable for the following components:   WBC 10.7 (*)    RBC 4.02 (*)    Hemoglobin 10.7 (*)    HCT 36.4 (*)    MCHC 29.4 (*)    All other components within normal limits    BRAIN NATRIURETIC PEPTIDE - Abnormal; Notable for the following components:   B Natriuretic Peptide 679.6 (*)    All other components within normal limits  URINALYSIS, ROUTINE W REFLEX MICROSCOPIC - Abnormal; Notable for the following components:   Protein, ur 100 (*)    Leukocytes, UA TRACE (*)    Bacteria, UA RARE (*)    All other components within normal limits  I-STAT TROPONIN, ED  I-STAT TROPONIN, ED    EKG EKG Interpretation  Date/Time:  Wednesday May 15 2018 09:39:44 EDT Ventricular Rate:  104 PR Interval:  198 QRS Duration: 148 QT Interval:  360 QTC Calculation: 473 R Axis:   158 Text Interpretation:  Sinus tachycardia with occasional Premature ventricular complexes Right axis deviation Non-specific intra-ventricular conduction block Abnormal ECG No significant change since last tracing Confirmed by [name] [id number]) on 05/15/2018 4:14:08 PM   Radiology Dg Chest 2 View  Result Date: 05/15/2018 CLINICAL DATA:  Shortness of breath and chest tightness. EXAM: CHEST - 2 VIEW COMPARISON:  10/06/2017 FINDINGS: The patient has new bilateral interstitial pulmonary edema, right greater than left. Slight pulmonary vascular congestion, new. Chronic mild cardiomegaly. AICD in place. No effusions.  No acute bone abnormality. IMPRESSION: New mild interstitial pulmonary edema and pulmonary vascular congestion consistent with congestive heart failure. Electronically Signed   By: Lorriane Shire M.D.   On: 05/15/2018 10:00    Procedures Procedures (including critical care time)  Medications Ordered in ED Medications  nitroGLYCERIN (NITROSTAT) SL tablet 0.4 mg ( Sublingual Canceled Entry 05/15/18 1645)  enalaprilat (VASOTEC) injection 1.25 mg (1.25 mg Intravenous Given 05/15/18 1638)  cefTRIAXone (ROCEPHIN) 2 g in sodium chloride 0.9 % 100 mL IVPB (0 g Intravenous Stopped 05/15/18 1711)  azithromycin (ZITHROMAX) tablet 500 mg (500 mg Oral Given 05/15/18 1633)  furosemide (LASIX)  injection 40 mg (40 mg Intravenous Given 05/15/18 1636)  potassium chloride SA (K-DUR,KLOR-CON) CR tablet 30 mEq (30 mEq Oral Given 05/15/18 1648)     Initial Impression / Assessment and Plan / ED Course  I have reviewed the triage vital signs and the nursing notes.  Pertinent labs & imaging results that were available during my care of the patient were reviewed by me and considered in my medical decision making (see chart for details).    Admir Candelas presents due to shortness of breath and leg swelling as per above.  On exam, he is hypertensive and mildly tachycardic (95-105).  He is dyspneic appearing.  I am primarily concerned for an acute heart failure exacerbation.  ACS and pneumonia are also on the differential.  He does not have a history of COPD.  I do not think this is an acute aortic dissection.  PE is less likely given that he has no history of VTE, unilateral leg swelling, hypoxia, or chest pain.  He has a mild leukocytosis to 10.7.  His BNP is elevated to 680.  His troponin is not elevated.  He is mildly hypokalemic with a potassium of 3.4.  His ECG shows no evidence of acute ischemia or dysrhythmia.  For treatment, he will be given 3 sublingual nitroglycerin tablets, enalapril, and Lasix.  Due to his subjective fever at home, borderline fever in the ED, and productive cough, we will also initiate treatment for possible CAP with Rocephin and azithromycin.  It is unclear 2/2 to pulmonary vascular congestion if there is an underlying pneumonia. We will also given oral potassium replacement.  At 605 PM I reassessed the patient.  He said that he is feeling better.  I believe that he is safe and appropriate for the floor.  I believe that he requires admission for his acute heart failure exacerbation.  He was admitted to the hospitalist service (Dr. Tamala Julian).  Final Clinical Impressions(s) / ED Diagnoses   Final diagnoses:  Community acquired pneumonia, unspecified laterality   Acute on chronic congestive heart failure, unspecified heart failure type [facility])    ED Discharge Orders    None       Alford Highland, MD 05/15/18 [place], Dan, DO 05/15/18 [place], Rigby, DO 05/27/18 1015

## 2018-05-15 NOTE — Progress Notes (Signed)
RT placed pt on CPAP for the night. Pt did not know his home settings. RT placed on Auto titrate with 3 LPM O2 bled in . RT will continue to monitor.

## 2018-05-15 NOTE — ED Notes (Signed)
Patient placed on 2L O2 nasal cannula for comfort. Reports some relief in shortness of breath.

## 2018-05-15 NOTE — H&P (Addendum)
History and Physical    Dean [name] WNI:[id number] DOB: November 25, 1950 DOA: 05/15/2018  Referring MD/NP/PA: Alford Highland, MD(resident) PCP: [name], MD  Patient coming from: Home  Chief Complaint: Shortness of breath  I have personally briefly reviewed patient's old medical records in [place]   HPI: Dean [name] is a 67 y.o. male with medical history significant of systolic CHF last EF 30- 38% with grade 2DD in 08/2016, CAD, VT/VF cardiac arrest s/p AICD, DM type II, OSA on CPAP and CKD stage 3; who presents with complaints of progressively worsening shortness of breath over the last 2 weeks.  He reports having progressively worsening lower extremity swelling and a cough with white foamy sputum production that he notes is hard to get up.  Whenever he is trying to get up and get around he complains that his breathing is very labored.  Patient reports sleeping on 2-3 pillows at night and reports that when he went to the doctor's office 2 days ago, for physical exam his weight was actually down 10 pounds.  Other associated symptoms include complaints of subjective fever and malaise.  Patient's daughter is present in the room and notes that she had recently gotten him a new pill dispenser, but patient still have been missing some of his nighttime doses of his medicines.   ED Course:Upon admission into the emergency department patient was seen to have a temperature up to 100.1 F, pulse 94-1 01, respiration 20-37, blood pressures 135/98-209/92, and O2 saturations 94 to 100% on 2 L nasal cannula oxygen.  Labs revealed WBC 10.7, hemoglobin 10.7, potassium 3.4, creatinine 1.29, BUN 679.6, and troponin negative x2.  Patient was given 40 mg of Lasix IV, 30 mEq of potassium chloride, Vasotec 1.25 mg IV,  Review of Systems  Constitutional: Positive for fever and malaise/fatigue. Negative for chills.  HENT: Negative for nosebleeds and sore throat.   Eyes: Negative for photophobia and pain.    Respiratory: Positive for cough, sputum production and shortness of breath.   Cardiovascular: Positive for leg swelling. Negative for chest pain.  Gastrointestinal: Negative for abdominal pain, nausea and vomiting.  Genitourinary: Negative for dysuria and flank pain.  Musculoskeletal: Positive for myalgias. Negative for falls.  Neurological: Negative for loss of consciousness.  Psychiatric/Behavioral: Negative for memory loss and substance abuse.    Past Medical History:  Diagnosis Date  . 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 Arthritis    "maybe in his spine" (09/05/2016)  . Asthma    pt. denies  . Cardiac arrest ([place]) 08/27/2016   REQUIRING CPR, SHOCK & MEDICATIONS  . CHF (congestive heart failure) ([place])   . Coronary artery disease    90% ostRCA stenosis and total occlusion of dRCA with L-->R collaterals 2017  . Dysrhythmia   . Gout   . High cholesterol   . Hypertension   . Ischemic cardiomyopathy   . LBBB (left bundle branch block)   . OSA on CPAP   . Type II diabetes mellitus ([place])     Past Surgical History:  Procedure Laterality Date  . CARDIAC CATHETERIZATION N/A 09/07/2016   Procedure: Left Heart Cath and Coronary Angiography;  Surgeon: Peter M Martinique, MD;  Location: [facility];  Service: Cardiovascular;  Laterality: N/A;  . EP IMPLANTABLE DEVICE N/A 10/10/2016   Procedure: ICD Implant;  Surgeon: Will Meredith Leeds, MD;  Location: [facility];  Service: Cardiovascular;  Laterality: N/A;  . LUMBAR LAMINECTOMY/DECOMPRESSION MICRODISCECTOMY N/A 01/18/2018   Procedure: L2-3,  L3-4, L-4-5 CENTRAL DECOMPRESSION;  Surgeon: [name], MD;  Location: [facility];  Service: Orthopedics;  Laterality: N/A;     reports that he has never smoked. He has never used smokeless tobacco. He reports that he does not drink alcohol or use drugs.  Allergies  Allergen Reactions  . Contrast Media [Iodinated Diagnostic Agents] Shortness Of Breath and  Nausea And Vomiting  . Lisinopril Cough    Family History  Problem Relation Age of Onset  . Heart attack Father        died in his 59's  . Hypertension Sister   . Cancer Brother        uncertain, "leg"  . Hypertension Sister     Prior to Admission medications   Medication Sig Start Date End Date Taking? Authorizing Provider  allopurinol (ZYLOPRIM) 300 MG tablet Take 300 mg by mouth daily. 08/16/16  Yes [provider]  amiodarone (PACERONE) 200 MG tablet TAKE 1 TABLET BY MOUTH EVERY DAY Patient taking differently: Take 200 mg by mouth daily.  09/04/17  Yes Camnitz, Ocie Doyne, MD  aspirin EC 81 MG EC tablet Take 1 tablet (81 mg total) by mouth daily. 09/16/16  Yes Elgergawy, Silver Huguenin, MD  atorvastatin (LIPITOR) 40 MG tablet Take 1 tablet (40 mg total) by mouth daily at 6 PM. 10/27/16  Yes Camnitz, Will Hassell Done, MD  carvedilol (COREG) 25 MG tablet TAKE 1 TABLET (25 MG TOTAL) BY MOUTH 2 (TWO) TIMES DAILY WITH A MEAL. 05/24/17  Yes Strader, [place] M, PA-C  [employer] VITAMIN D3 1000 units capsule Take 1,000 Units by mouth daily. 08/16/16  Yes [provider]  furosemide (LASIX) 40 MG tablet Take 40 mg by mouth 2 (two) times daily.  06/07/17  Yes [provider]  KLOR-CON M20 20 MEQ tablet TAKE 1 TABLET BY MOUTH EVERY DAY Patient taking differently: Take 20 mEq by mouth daily.  09/04/17  Yes Camnitz, Will Hassell Done, MD  losartan (COZAAR) 100 MG tablet Take 1 tablet (100 mg total) by mouth daily. 07/04/17 10/06/24 Yes Camnitz, Ocie Doyne, MD  magnesium oxide (MAG-OX) 400 MG tablet Take 200 mg by mouth 2 (two) times daily.   Yes [provider]  metFORMIN (GLUCOPHAGE) 500 MG tablet Take 1 tablet (500 mg total) by mouth daily with breakfast. Patient taking differently: Take 500 mg by mouth 2 (two) times daily with a meal.  09/21/16  Yes Angiulli, Lavon Paganini, PA-C    Physical Exam:  Constitutional: Elderly male who appears to be in some mild respiratory distress  sitting up in the hospital gurney. Vitals:   05/15/18 1600 05/15/18 1615 05/15/18 1644 05/15/18 1649  BP: (!) 180/71 (!) 173/67  (!) 179/79  Pulse: 96 94  95  Resp:  (!) 29  (!) 32  Temp:      TempSrc:      SpO2: 94% 96%  96%  Weight:   122 kg   Height:   5\' 6"  (1.676 m)    Eyes: PERRL, lids and conjunctivae normal ENMT: Mucous membranes are moist. Posterior pharynx clear of any exudate or lesions.  Neck: normal, supple, no masses, no thyromegaly.  JVD present. Respiratory: Tachypneic with positive crackles heard in the mid to lower lung fields bilaterally.  No significant wheezes or rhonchi appreciated. Cardiovascular: Regular rate and rhythm, positive systolic ejection murmur present 2 out of 6.  No rubs / gallops.  3+ pitting bilateral lower extremity edema. 2+ pedal pulses. No carotid bruits.  Abdomen: no tenderness,  no masses palpated. No hepatosplenomegaly. Bowel sounds positive.  Musculoskeletal: no clubbing / cyanosis. No joint deformity upper and lower extremities. Good ROM, no contractures. Normal muscle tone.  Skin: no rashes, lesions, ulcers. No induration Neurologic: CN 2-12 grossly intact. Sensation intact, DTR normal. Strength 5/5 in all 4.  Psychiatric: Normal judgment and insight. Alert and oriented x 3. Normal mood.     Labs on Admission: I have personally reviewed following labs and imaging studies  CBC: Recent Labs  Lab 05/15/18 0950  WBC 10.7*  HGB 10.7*  HCT 36.4*  MCV 90.5  PLT 160   Basic Metabolic Panel: Recent Labs  Lab 05/15/18 0950  NA 140  K 3.4*  CL 107  CO2 23  GLUCOSE 129*  BUN 17  CREATININE 1.29*  CALCIUM 9.9   GFR: Estimated Creatinine Clearance: 67.5 mL/min (A) (by C-G formula based on SCr of 1.29 mg/dL (H)). Liver Function Tests: No results for input(s): AST, ALT, ALKPHOS, BILITOT, PROT, ALBUMIN in the last 168 hours. No results for input(s): LIPASE, AMYLASE in the last 168 hours. No results for input(s): AMMONIA in the last  168 hours. Coagulation Profile: No results for input(s): INR, PROTIME in the last 168 hours. Cardiac Enzymes: No results for input(s): CKTOTAL, CKMB, CKMBINDEX, TROPONINI in the last 168 hours. BNP (last 3 results) No results for input(s): PROBNP in the last 8760 hours. HbA1C: No results for input(s): HGBA1C in the last 72 hours. CBG: No results for input(s): GLUCAP in the last 168 hours. Lipid Profile: No results for input(s): CHOL, HDL, LDLCALC, TRIG, CHOLHDL, LDLDIRECT in the last 72 hours. Thyroid Function Tests: No results for input(s): TSH, T4TOTAL, FREET4, T3FREE, THYROIDAB in the last 72 hours. Anemia Panel: No results for input(s): VITAMINB12, FOLATE, FERRITIN, TIBC, IRON, RETICCTPCT in the last 72 hours. Urine analysis:    Component Value Date/Time   COLORURINE YELLOW 05/15/2018 [place] 05/15/2018 1705   LABSPEC 1.008 05/15/2018 1705   PHURINE 6.0 05/15/2018 1705   GLUCOSEU NEGATIVE 05/15/2018 1705   HGBUR NEGATIVE 05/15/2018 1705   BILIRUBINUR NEGATIVE 05/15/2018 [place] 05/15/2018 1705   PROTEINUR 100 (A) 05/15/2018 1705   NITRITE NEGATIVE 05/15/2018 1705   LEUKOCYTESUR TRACE (A) 05/15/2018 1705   Sepsis Labs: No results found for this or any previous visit (from the past 240 hour(s)).   Radiological Exams on Admission: Dg Chest 2 View  Result Date: 05/15/2018 CLINICAL DATA:  Shortness of breath and chest tightness. EXAM: CHEST - 2 VIEW COMPARISON:  10/06/2017 FINDINGS: The patient has new bilateral interstitial pulmonary edema, right greater than left. Slight pulmonary vascular congestion, new. Chronic mild cardiomegaly. AICD in place. No effusions.  No acute bone abnormality. IMPRESSION: New mild interstitial pulmonary edema and pulmonary vascular congestion consistent with congestive heart failure. Electronically Signed   By: Lorriane Shire M.D.   On: 05/15/2018 10:00    EKG: Independently reviewed.  Sinus tachycardia 104 bpm  with PVCs.  Assessment/Plan Combined systolic and diastolic congestive heart failure exacerbation: Acute on chronic.  Patient reports having worsening lower extremity swelling and shortness of breath.  He admits to missed doses of medications.  Physical exam patient with crackles and at least 2+ pitting lower extremity edema.  Chest x-ray showing interstitial edema consistent with heart failure.  BNP elevated at 679.6.  Patient was given 40 mg of Lasix IV. - Admit to a telemetry bed  - Heart failure orders set  initiated  - Continuous pulse oximetry  with nasal cannula oxygen as needed to keep O2 saturations >92% - Strict I&Os and daily weights - Elevate lower extremities - Lasix 40 mg IV bid  - Reassess in a.m. and adjust diuresis as needed. - Check echocardiogram  - Case management consult for likely need of home medication monitoring once discharge - Message sent for cardiology to evaluate in a.m.  Possible community-acquired pneumonia: Patient reports having a cough with subjective fevers.  Temperature up to 100.1 F in the ED with mild the elevated white blood cell count of 10.7.   - Check sputum culture, if patient able to produce ample - Continue empiric antibiotics of ceftriaxone and azithromycin - Mucinex   Leukocytosis: Acute.  WBC elevated at 10.7.  Question possibility of underlying infection, but also could be reactive. - Recheck CBC in a.m.  Hypertensive urgency: Acute.  Initial blood pressures elevated up to 209/92. - Continue Coreg and losartan  Hypokalemia: Initial potassium noted to be 3.4 on admission.  Patient was given 30 mEq p.o. - Continue home regimen of potassium supplementation - Adjust regimen as needed  CAD: Patient has known carotid artery disease from last catheterization in 08/2016 showing 90-100% stenosis RCA. - Continue aspirin and statin  History of VT/VF arrest s/p AICD, Ischemic cardiomyopathy:  Patient followed by Dr. Curt Bears of  electrophysiology. - Continue amiodarone  Normocytic normochromic anemia: Hemoglobin 10.7 on admission.  Patient denies any reports of bleeding. - Recheck CBC in a.m.  Chronic kidney disease stage III: Creatinine appears to be at patient's baseline which appears to be around 1.3. - Continue to monitor  Diabetes mellitus type 2: Last hemoglobin A1c on file 5.5 in 08/2016.  Blood sugars on admission noted to be just mildly elevated at 129. - Hypoglycemic protocols - Hold metformin - CBGs q. before meals with sensitive SSI  History of gout: Patient denies any acute flares at this time - continue allopurinol   Hyperlipidemia - Continue atorvastatin  OSA on CPAP - Continue CPAP   DVT prophylaxis: Lovenox Code Status: Full Family Communication: Discussed plan of care with the patient and family present at bedside Disposition Plan: Likely discharge home once medically stable to 3 days Consults called: None Admission status: Patient  Norval Morton MD [facility] Pager [phone]   If 7PM-7AM, please contact night-coverage [email or web address] Password [facility]  05/15/2018, 6:30 PM

## 2018-05-15 NOTE — ED Notes (Signed)
RT placing pt on CPAP for bedside. Pt resting in bed.

## 2018-05-16 ENCOUNTER — Inpatient Hospital Stay (HOSPITAL_COMMUNITY): Payer: Medicare HMO

## 2018-05-16 ENCOUNTER — Encounter (HOSPITAL_COMMUNITY): Payer: Self-pay | Admitting: General Practice

## 2018-05-16 ENCOUNTER — Other Ambulatory Visit (HOSPITAL_COMMUNITY): Payer: Medicare HMO

## 2018-05-16 ENCOUNTER — Other Ambulatory Visit: Payer: Self-pay

## 2018-05-16 DIAGNOSIS — I5043 Acute on chronic combined systolic (congestive) and diastolic (congestive) heart failure: Secondary | ICD-10-CM

## 2018-05-16 DIAGNOSIS — N183 Chronic kidney disease, stage 3 unspecified: Secondary | ICD-10-CM

## 2018-05-16 DIAGNOSIS — I2510 Atherosclerotic heart disease of native coronary artery without angina pectoris: Secondary | ICD-10-CM

## 2018-05-16 DIAGNOSIS — E1169 Type 2 diabetes mellitus with other specified complication: Secondary | ICD-10-CM

## 2018-05-16 DIAGNOSIS — E669 Obesity, unspecified: Secondary | ICD-10-CM

## 2018-05-16 DIAGNOSIS — I361 Nonrheumatic tricuspid (valve) insufficiency: Secondary | ICD-10-CM

## 2018-05-16 DIAGNOSIS — R748 Abnormal levels of other serum enzymes: Secondary | ICD-10-CM

## 2018-05-16 LAB — TROPONIN I: Troponin I: 0.07 ng/mL (ref <0.03)

## 2018-05-16 LAB — BASIC METABOLIC PANEL
Anion gap: 9 (ref 5–15)
BUN: 12 mg/dL (ref 8–23)
CO2: 27 mmol/L (ref 22–32)
Calcium: 9.7 mg/dL (ref 8.9–10.3)
Chloride: 105 mmol/L (ref 98–111)
Creatinine, Ser: 1.1 mg/dL (ref 0.61–1.24)
GFR calc Af Amer: >60 mL/min (ref >60)
GFR calc non Af Amer: >60 mL/min (ref >60)
Glucose, Bld: 98 mg/dL (ref 70–99)
Potassium: 4.4 mmol/L (ref 3.5–5.1)
Sodium: 141 mmol/L (ref 135–145)

## 2018-05-16 LAB — LEGIONELLA PNEUMOPHILA SEROGP 1 UR AG: L. pneumophila Serogp 1 Ur Ag: NEGATIVE

## 2018-05-16 LAB — GLUCOSE, CAPILLARY
Glucose-Capillary: 165 mg/dL — ABNORMAL HIGH (ref 70–99)
Glucose-Capillary: 118 mg/dL — ABNORMAL HIGH (ref 70–99)

## 2018-05-16 LAB — ECHOCARDIOGRAM COMPLETE
Height: 65 in
Weight: 4355.2 oz

## 2018-05-16 LAB — MAGNESIUM: Magnesium: 1.7 mg/dL (ref 1.7–2.4)

## 2018-05-16 MED ORDER — PERFLUTREN LIPID MICROSPHERE
1.0000 mL | INTRAVENOUS | Status: AC | PRN
  Administered 2018-05-16: 3.5 mL via INTRAVENOUS
  Filled 2018-05-16: qty 10

## 2018-05-16 NOTE — ED Notes (Signed)
Gave pt water, per Carmelina Paddock' - RN.

## 2018-05-16 NOTE — ED Notes (Signed)
Pt's breakfast tray arrived 

## 2018-05-16 NOTE — Progress Notes (Signed)
Nutrition Brief Note  RD consulted for assessment and diet education due to obesity.   Wt Readings from Last 15 Encounters:  05/16/18 123.5 kg  03/08/18 123.4 kg  02/19/18 123.4 kg  02/12/18 123.4 kg  01/18/18 126.1 kg  01/11/18 126.2 kg  01/09/18 123.4 kg  12/17/17 126.9 kg  12/05/17 124.3 kg  10/05/17 124.7 kg  07/04/17 125.8 kg  01/04/17 121.7 kg  10/10/16 116.6 kg  10/05/16 116.8 kg  09/21/16 110.9 kg   67 yo male with PMH of CAD (CTO of the RCA), CHF, VT/VF s/p ICD, HL, HTN, DM and OSA on Cpap who presented with acute on chronic HF and PNA.   Pt out of room at time of visit (in ECHO lab).   RD was educated for assessment and education for obesity, however, education handouts focused on heart failure (as pt was admitted to heart failure exacerbation.   Reviewed DM hx; last Hgb A1c: 5.5 (08/27/16). No recent Hgb A1c to assess. Per chart review, pt was on no DM medications PTA. No orders to check CBGS and no inpatient orders for glycemic management at this time.   Weight loss is not an ideal goal for acute hospitalization. If further work-up for obesity is desired, consider outpatient referral to bariatric service and/or [facility].   Body mass index is 45.3 kg/m. Patient meets criteria for extreme obesity, class III based on current BMI.   Current diet order is Heart Healthy/ Carb Modified, patient is consuming approximately 100% of meals at this time. Labs and medications reviewed.   No nutrition interventions warranted at this time. If nutrition issues arise, please consult RD.   Donatella Walski A. Jimmye Norman, RD, LDN, CDE Pager: [phone] After hours Pager: [phone]

## 2018-05-16 NOTE — Progress Notes (Signed)
Admission RN states she will see pt. 

## 2018-05-16 NOTE — Progress Notes (Addendum)
  PROGRESS NOTE  Dean [name] [id number]:[id number] DOB: Oct 07, 1950 DOA: 05/15/2018 PCP: [name], MD  Brief Narrative: 67 year old man with systolic CHF, multiple other comorbidities presented with progressive shortness of breath over 2 weeks with sputum production, orthopnea, lower extremity edema.  Admitted for acute CHF, possible pneumonia.  Assessment/Plan Acute on chronic systolic/diastolic CHF. --Improving with diuresis.  Continue IV Lasix. --Wean oxygen as tolerated --Follow-up echocardiogram and cardiology recommendations  Possible pneumonia.  Chest x-ray unrevealing.  Noted to have cough and low-grade temperature in the emergency department. --Favor short course of antibiotics.  Hypertensive urgency on admission --Resolved with diuresis.  CKD stage III --stable  Diabetes mellitus type 2 --stable. Hold metformin. SSI.  Morbid obesity. Body mass index is 45.3 kg/m. --consult dietician  DVT prophylaxis: enoxaparin Code Status: Full Family Communication: sister Disposition Plan: home    Murray Hodgkins, MD  [facility] Direct contact: [phone] --[place]  --[email or web address]; password [email or web address]  7PM-7AM contact night coverage as above 05/16/2018, 3:11 PM  LOS: 1 day   Consultants:  Cardiology   Procedures:    Antimicrobials:    Interval history/Subjective: Feels better, breathing better.   Objective: Vitals:  Vitals:   05/16/18 1201 05/16/18 1235  BP: 122/61 (!) 117/56  Pulse: 70 66  Resp: (!) 22 (!) 22  Temp:  (!) 97.4 F (36.3 C)  SpO2: 100% 100%    Exam:  Constitutional:  . Appears calm and comfortable ENMT:  . grossly normal hearing  Respiratory:  . CTA bilaterally, no w/r/r.  . Respiratory effort normal. Cardiovascular:  . RRR, no m/r/g . 3+ BLE extremity edema   Psychiatric:  . Mental status o Mood, affect appropriate  I have personally reviewed the following:   Labs:  Basic metabolic panel  unremarkable  Troponins flat, 0.07  Imaging studies:  Chest x-ray mild pulmonary edema independently reviewed  Medical tests:  EKG Sinus tachycardia with nonspecific intraventricular conduction block.  Scheduled Meds: . allopurinol  300 mg Oral Daily  . amiodarone  200 mg Oral Daily  . aspirin EC  81 mg Oral Daily  . atorvastatin  40 mg Oral q1800  . azithromycin  500 mg Oral Daily  . carvedilol  25 mg Oral BID WC  . enoxaparin (LOVENOX) injection  40 mg Subcutaneous Q24H  . furosemide  40 mg Intravenous BID  . losartan  100 mg Oral Daily  . magnesium oxide  200 mg Oral BID  . potassium chloride SA  20 mEq Oral Daily  . sodium chloride flush  3 mL Intravenous Q12H   Continuous Infusions: . sodium chloride    . cefTRIAXone (ROCEPHIN)  IV Stopped (05/16/18 1257)    Principal Problem:   Acute on chronic combined systolic and diastolic CHF (congestive heart failure) (H[facility]) Active Problems:   DM2 (diabetes mellitus, type 2) (HCC)   Morbid obesity (HCC)   Diabetes mellitus type 2 in obese ([place])   Hypokalemia   Stage 3 chronic kidney disease ([place])   ICD (implantable cardioverter-defibrillator) in place   CAD (coronary artery disease)   Normochromic normocytic anemia   CKD (chronic kidney disease), stage III ([place])   LOS: 1 day

## 2018-05-16 NOTE — Plan of Care (Signed)
  Problem: Education: Goal: Knowledge of General Education information will improve Description Including pain rating scale, medication(s)/side effects and non-pharmacologic comfort measures 05/16/2018 1317 by Ottie Glazier, RN Outcome: Progressing 05/16/2018 1316 by Ottie Glazier, RN Outcome: Progressing   Problem: Health Behavior/Discharge Planning: Goal: Ability to manage health-related needs will improve 05/16/2018 1317 by Ottie Glazier, RN Outcome: Progressing 05/16/2018 1316 by Ottie Glazier, RN Outcome: Progressing   Problem: Clinical Measurements: Goal: Ability to maintain clinical measurements within normal limits will improve 05/16/2018 1317 by Ottie Glazier, RN Outcome: Progressing 05/16/2018 1316 by Ottie Glazier, RN Outcome: Progressing   Problem: Activity: Goal: Risk for activity intolerance will decrease Outcome: Progressing   Problem: Nutrition: Goal: Adequate nutrition will be maintained 05/16/2018 1317 by Ottie Glazier, RN Outcome: Progressing 05/16/2018 1316 by Ottie Glazier, RN Outcome: Progressing   Problem: Coping: Goal: Level of anxiety will decrease 05/16/2018 1317 by Ottie Glazier, RN Outcome: Progressing 05/16/2018 1316 by Ottie Glazier, RN Outcome: Progressing   Problem: Elimination: Goal: Will not experience complications related to bowel motility Outcome: Progressing   Problem: Safety: Goal: Ability to remain free from injury will improve 05/16/2018 1317 by Ottie Glazier, RN Outcome: Progressing 05/16/2018 1316 by Ottie Glazier, RN Outcome: Progressing   Problem: Skin Integrity: Goal: Risk for impaired skin integrity will decrease Outcome: Progressing

## 2018-05-16 NOTE — Plan of Care (Signed)
  Problem: Education: Goal: Knowledge of General Education information will improve Description: Including pain rating scale, medication(s)/side effects and non-pharmacologic comfort measures Outcome: Progressing   Problem: Health Behavior/Discharge Planning: Goal: Ability to manage health-related needs will improve Outcome: Progressing   Problem: Clinical Measurements: Goal: Ability to maintain clinical measurements within normal limits will improve Outcome: Progressing   Problem: Nutrition: Goal: Adequate nutrition will be maintained Outcome: Progressing   Problem: Coping: Goal: Level of anxiety will decrease Outcome: Progressing   Problem: Safety: Goal: Ability to remain free from injury will improve Outcome: Progressing   

## 2018-05-16 NOTE — Progress Notes (Signed)
  Echocardiogram 2D Echocardiogram has been performed.  Dean [name] 05/16/2018, 4:28 PM

## 2018-05-16 NOTE — Consult Note (Addendum)
Cardiology Consult    Patient ID: Dean [name] MRN: [id number], DOB/AGE: 1951-08-06   Admit date: 05/15/2018 Date of Consult: 05/16/2018  Primary Physician: [name], MD Primary Cardiologist: Dr. Baird Kay Requesting Provider: Dr. Tamala Julian Reason for Consultation: CHF  Dean [name] is a 67 y.o. male who is being seen today for the evaluation of CHF at the request of Dr. Tamala Julian.   Patient Profile    67 yo male with PMH of CAD (CTO of the RCA), CHF, VT/VF s/p ICD, HL, HTN, DM and OSA on Cpap who presented with acute on chronic HF and PNA.   Past Medical History   Past Medical History:  Diagnosis Date  . 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 Arthritis    "maybe in his spine" (09/05/2016)  . Asthma    pt. denies  . Cardiac arrest ([place]) 08/27/2016   REQUIRING CPR, SHOCK & MEDICATIONS  . CHF (congestive heart failure) ([place])   . Coronary artery disease    90% ostRCA stenosis and total occlusion of dRCA with L-->R collaterals 2017  . Dysrhythmia   . Gout   . High cholesterol   . Hypertension   . Ischemic cardiomyopathy   . LBBB (left bundle branch block)   . OSA on CPAP   . Type II diabetes mellitus ([place])     Past Surgical History:  Procedure Laterality Date  . CARDIAC CATHETERIZATION N/A 09/07/2016   Procedure: Left Heart Cath and Coronary Angiography;  Surgeon: Peter M Martinique, MD;  Location: [facility];  Service: Cardiovascular;  Laterality: N/A;  . EP IMPLANTABLE DEVICE N/A 10/10/2016   Procedure: ICD Implant;  Surgeon: Will Meredith Leeds, MD;  Location: [facility];  Service: Cardiovascular;  Laterality: N/A;  . LUMBAR LAMINECTOMY/DECOMPRESSION MICRODISCECTOMY N/A 01/18/2018   Procedure: L2-3, L3-4, L-4-5 CENTRAL DECOMPRESSION;  Surgeon: [name], MD;  Location: [facility];  Service: Orthopedics;  Laterality: N/A;     Allergies  Allergies  Allergen Reactions  . Contrast Media [Iodinated Diagnostic Agents] Shortness Of  Breath and Nausea And Vomiting  . Lisinopril Cough    History of Present Illness    [name] is a 67 yo male with PMH of CAD (CTO of the RCA), CHF, VT/VF s/p ICD, HL, HTN, DM and OSA on Cpap. He is followed by Dr. Baird Kay as an outpatient. He presented in 2017 after he collapsed in the bathroom. Required CPR and ALCS. Had cath that showed a 90% ostial RCA and 100% CTO of the distal RCA with collaterals. EF noted at 35-40% with akinesis in the basalinferior and apical myocardium with G2DD. Underwent ICD implant post cath. He was last seen in the office on 10/18 with Dr. Baird Kay and reported being in his usual state of health. Did note issues compliance with daily weights at this visit. Instructed on importance and medication compliance.   He reports developing worsening shortness of breath a couple of days ago. Does admit to missing doses of his evening diuretic at times. Also does not do well with watching his Na+ intake. Does not weigh regularly but noted his legs seemed more swollen. Yesterday his symptoms became worse and woke him from sleep. Presented to the ED with symptoms.   In the ED his labs showed stable electrolytes, with a K+ 3.4, Cr 1.29, Hgb 10.7, BNP 679, Trop 0.04>>0.04>>0.08>>0.07. CXR noted bilateral pulmonary edema. EKG showed ST with known LBBB. He was febrile in the ED and noted to have a cough. Started  on antibiotics and IV lasix. Admitted to IM for further work up. He has diuresed with IV lasix and weight is trending down. Breathing has improved. Does not use oxygen at home, and weaning from [employer] now.   Inpatient Medications    . allopurinol  300 mg Oral Daily  . amiodarone  200 mg Oral Daily  . aspirin EC  81 mg Oral Daily  . atorvastatin  40 mg Oral q1800  . azithromycin  500 mg Oral Daily  . carvedilol  25 mg Oral BID WC  . enoxaparin (LOVENOX) injection  40 mg Subcutaneous Q24H  . furosemide  40 mg Intravenous BID  . losartan  100 mg Oral Daily  . magnesium oxide   200 mg Oral BID  . potassium chloride SA  20 mEq Oral Daily  . sodium chloride flush  3 mL Intravenous Q12H    Family History    Family History  Problem Relation Age of Onset  . Heart attack Father        died in his 74's  . Hypertension Sister   . Cancer Brother        uncertain, "leg"  . Hypertension Sister     Social History    Social History   Socioeconomic History  . Marital status: Married    Spouse name: Not on file  . Number of children: Not on file  . Years of education: Not on file  . Highest education level: Not on file  Occupational History  . Not on file  Social Needs  . Financial resource strain: Not on file  . Food insecurity:    Worry: Not on file    Inability: Not on file  . Transportation needs:    Medical: Not on file    Non-medical: Not on file  Tobacco Use  . Smoking status: Never Smoker  . Smokeless tobacco: Never Used  Substance and Sexual Activity  . Alcohol use: No  . Drug use: No  . Sexual activity: Not on file  Lifestyle  . Physical activity:    Days per week: Not on file    Minutes per session: Not on file  . Stress: Not on file  Relationships  . Social connections:    Talks on phone: Not on file    Gets together: Not on file    Attends religious service: Not on file    Active member of club or organization: Not on file    Attends meetings of clubs or organizations: Not on file    Relationship status: Not on file  . Intimate partner violence:    Fear of current or ex partner: Not on file    Emotionally abused: Not on file    Physically abused: Not on file    Forced sexual activity: Not on file  Other Topics Concern  . Not on file  Social History Narrative  . Not on file     Review of Systems    See HPI  All other systems reviewed and are otherwise negative except as noted above.  Physical Exam    Blood pressure (!) 117/56, pulse 66, temperature (!) 97.4 F (36.3 C), temperature source Oral, resp. rate (!) 22,  height 5\' 5"  (1.651 m), weight 123.5 kg, SpO2 100 %.  General: Pleasant, obese older AAM, NAD Psych: Normal affect. Neuro: Alert and oriented X 3. Moves all extremities spontaneously. HEENT: Normal  Neck: Supple, no JVD. Lungs:  Resp regular and unlabored, CTA. Heart: RRR no  murmurs. Abdomen: Soft, non-tender, non-distended, BS + x 4.  Extremities: No clubbing, cyanosis 1+ pitting LE edema bilaterally. DP/PT/Radials 2+ and equal bilaterally.  Labs    Troponin [facility] Test) Recent Labs    05/15/18 1635  TROPIPOC 0.04   Recent Labs    05/15/18 1849 05/16/18 0153  TROPONINI 0.08* 0.07*   Lab Results  Component Value Date   WBC 10.7 (H) 05/15/2018   HGB 10.7 (L) 05/15/2018   HCT 36.4 (L) 05/15/2018   MCV 90.5 05/15/2018   PLT 223 05/15/2018    Recent Labs  Lab 05/16/18 0153  NA 141  K 4.4  CL 105  CO2 27  BUN 12  CREATININE 1.10  CALCIUM 9.7  GLUCOSE 98   Lab Results  Component Value Date   CHOL 159 09/07/2016   HDL 25 (L) 09/07/2016   LDLCALC 105 (H) 09/07/2016   TRIG 146 09/07/2016   No results found for: [facility]   Radiology Studies    Dg Chest 2 View  Result Date: 05/15/2018 CLINICAL DATA:  Shortness of breath and chest tightness. EXAM: CHEST - 2 VIEW COMPARISON:  10/06/2017 FINDINGS: The patient has new bilateral interstitial pulmonary edema, right greater than left. Slight pulmonary vascular congestion, new. Chronic mild cardiomegaly. AICD in place. No effusions.  No acute bone abnormality. IMPRESSION: New mild interstitial pulmonary edema and pulmonary vascular congestion consistent with congestive heart failure. Electronically Signed   By: Lorriane Shire M.D.   On: 05/15/2018 10:00    ECG & Cardiac Imaging    EKG:  The EKG was personally reviewed and demonstrates ST with known LBBB  Echo: 08/28/16  Study Conclusions  - Left ventricle: The cavity size was mildly dilated. Wall   thickness was increased in a pattern of severe LVH.  Systolic   function was moderately reduced. The estimated ejection fraction   was in the range of 35% to 40%. Akinesis of the basalinferior and   apical myocardium. Features are consistent with a pseudonormal   left ventricular filling pattern, with concomitant abnormal   relaxation and increased filling pressure (grade 2 diastolic   dysfunction). - Aortic valve: There was mild regurgitation. - Mitral valve: There was mild regurgitation. - Left atrium: The atrium was severely dilated.  Assessment & Plan    67 yo male with PMH of CAD (CTO of the RCA), CHF, VT/VF s/p ICD, HL, HTN, DM and OSA on Cpap who presented with acute on chronic HF and PNA.   1. Acute on Chronic combined HF: Known EF of 35-40% back in 12/17. Suspect this is a combination of medication noncompliance and dietary indiscretion. He also has not been weighing himself regularly. BNP >600 and CXR with edema. He is diuresing well with IV lasix. Would continue today, likely through tomorrow. Discussed the need for compliance and daily weights. Probably ok to transition back to 40mg  BID as, he just needs to remember to take it.  -- on medical therapy with BB and ARB  2. CAP: was febrile in the ED, with mildly elevated WBC. On antibiotics per primary team  3. Elevated troponin: suspect demand ischemia in the setting of acute HF and CAP. No chest pain. EKG with known LBBB.   4. CAD: known CTO of the RCA with collaterals. Remains on ASA.   5. VT/VF s/p ICD: followed by Dr. Baird Kay as an outpatient. Stable on last device check 4/19. Remains on amiodarone 200mg  daily.   6. HL: on statin.   Signed,  Reino Bellis, NP-C Pager [phone] 05/16/2018, 2:44 PM   Attending Note:   The patient was seen and examined.  Agree with assessment and plan as noted above.  Changes made to the above note as needed.  Patient seen and independently examined with Reino Bellis,  NP .   We discussed all aspects of the encounter. I agree with  the assessment and plan as stated above.  1.  Acute on chronic combined systolic and diastolic congestive heart failure: The patient admits to not taking his Lasix regularly.  He also admits to eating a lot of extra salt.  He presented with acute shortness of breath and volume overload.  He is been getting Lasix IV for the past couple days and is feeling better.  Continue IV Lasix for another day or so.  I would anticipate that he could go home on his same medications.  He just needs to be compliant and take them on a regular basis.   We reviewed his diet and is clear that he also eats a bit of extra salt.  2.  Elevated troponin level: This likely due to demand ischemia.  2.  Coronary artery disease: He has a chronic total occlusion of the right coronary artery.  He has left right collaterals.   I have spent a total of 40 minutes with patient reviewing hospital  notes , telemetry, EKGs, labs and examining patient as well as establishing an assessment and plan that was discussed with the patient. > 50% of time was spent in direct patient care.    Thayer Headings, Brooke Bonito., MD, [facility] 05/16/2018, 3:42 PM [address],  [place] Pager [phone]

## 2018-05-17 LAB — GLUCOSE, CAPILLARY
Glucose-Capillary: 95 mg/dL (ref 70–99)
Glucose-Capillary: 98 mg/dL (ref 70–99)
Glucose-Capillary: 113 mg/dL — ABNORMAL HIGH (ref 70–99)
Glucose-Capillary: 94 mg/dL (ref 70–99)

## 2018-05-17 LAB — BASIC METABOLIC PANEL
Anion gap: 9 (ref 5–15)
BUN: 21 mg/dL (ref 8–23)
CO2: 23 mmol/L (ref 22–32)
Calcium: 9.7 mg/dL (ref 8.9–10.3)
Chloride: 103 mmol/L (ref 98–111)
Creatinine, Ser: 1.45 mg/dL — ABNORMAL HIGH (ref 0.61–1.24)
GFR calc Af Amer: 56 mL/min — ABNORMAL LOW (ref >60)
GFR calc non Af Amer: 48 mL/min — ABNORMAL LOW (ref >60)
Glucose, Bld: 102 mg/dL — ABNORMAL HIGH (ref 70–99)
Potassium: 4.3 mmol/L (ref 3.5–5.1)
Sodium: 135 mmol/L (ref 135–145)

## 2018-05-17 NOTE — Progress Notes (Signed)
Pt. States he can place cpap on himself. RT informed pt. To notify if he needed anything.

## 2018-05-17 NOTE — Progress Notes (Addendum)
Progress Note  Patient Name: Dean [name] Date of Encounter: 05/17/2018  Primary Cardiologist: Dr. Daneen Schick, MD; Dr. Curt Bears, MD  Subjective   Pt reports feeling better than yesterday. Breathing improved. Denies chest pain or palpitations.   Inpatient Medications    Scheduled Meds: . allopurinol  300 mg Oral Daily  . amiodarone  200 mg Oral Daily  . aspirin EC  81 mg Oral Daily  . atorvastatin  40 mg Oral q1800  . azithromycin  500 mg Oral Daily  . carvedilol  25 mg Oral BID WC  . enoxaparin (LOVENOX) injection  40 mg Subcutaneous Q24H  . furosemide  40 mg Intravenous BID  . losartan  100 mg Oral Daily  . magnesium oxide  200 mg Oral BID  . potassium chloride SA  20 mEq Oral Daily  . sodium chloride flush  3 mL Intravenous Q12H   Continuous Infusions: . sodium chloride    . cefTRIAXone (ROCEPHIN)  IV 2 g (05/17/18 0852)   PRN Meds: sodium chloride, acetaminophen, hydrALAZINE, ipratropium-albuterol, ondansetron (ZOFRAN) IV, sodium chloride flush   Vital Signs    Vitals:   05/16/18 2337 05/17/18 0500 05/17/18 0545 05/17/18 1234  BP: (!) 125/55  (!) 141/56 (!) 124/55  Pulse: 64  65 61  Resp: 16  17   Temp: 98.5 F (36.9 C)  98.4 F (36.9 C) (!) 97.4 F (36.3 C)  TempSrc: Oral  Oral Oral  SpO2: 99%  98% 100%  Weight:  123.3 kg    Height:        Intake/Output Summary (Last 24 hours) at 05/17/2018 1313 Last data filed at 05/17/2018 0956 Gross per 24 hour  Intake 863.33 ml  Output 1125 ml  Net -261.67 ml   Filed Weights   05/15/18 1644 05/16/18 1234 05/17/18 0500  Weight: 122 kg 123.5 kg 123.3 kg   Physical Exam   General: Obese, NAD Skin: Warm, dry, intact  Head: Normocephalic, atraumatic, clear, moist mucus membranes. Neck: Negative for carotid bruits. No JVD Lungs:Clear to ausculation bilaterally. No wheezes, rales, or rhonchi. Breathing is unlabored. Cardiovascular: RRR with S1 S2. No murmurs, rubs, gallops, or LV heave  appreciated. Abdomen: Soft, non-tender, non-distended with normoactive bowel sounds. No obvious abdominal masses. MSK: Strength and tone appear normal for age. 5/5 in all extremities Extremities: 2-3+ BLE edema. No clubbing or cyanosis. DP/PT pulses 1+ bilaterally Neuro: Alert and oriented. No focal deficits. No facial asymmetry. MAE spontaneously. Psych: Responds to questions appropriately with normal affect.    Labs    Chemistry Recent Labs  Lab 05/15/18 0950 05/16/18 0153 05/17/18 0525  NA 140 141 135  K 3.4* 4.4 4.3  CL 107 105 103  CO2 23 27 23   GLUCOSE 129* 98 102*  BUN 17 12 21   CREATININE 1.29* 1.10 1.45*  CALCIUM 9.9 9.7 9.7  GFRNONAA 55* >60 48*  GFRAA >60 >60 56*  ANIONGAP 10 9 9      Hematology Recent Labs  Lab 05/15/18 0950  WBC 10.7*  RBC 4.02*  HGB 10.7*  HCT 36.4*  MCV 90.5  MCH 26.6  MCHC 29.4*  RDW 15.5  PLT 223    Cardiac Enzymes Recent Labs  Lab 05/15/18 1849 05/16/18 0153  TROPONINI 0.08* 0.07*    Recent Labs  Lab 05/15/18 1001 05/15/18 1635  TROPIPOC 0.04 0.04     BNP Recent Labs  Lab 05/15/18 0950  BNP 679.6*     DDimer No results for input(s): DDIMER in the  last 168 hours.   Radiology    No results found.  Telemetry    05/17/18 NSR/ST - Personally Reviewed  ECG    No new tracings as of 05/17/2018- Personally Reviewed  Cardiac Studies   Echo: 08/28/16  Study Conclusions  - Left ventricle: The cavity size was mildly dilated. Wall thickness was increased in a pattern of severe LVH. Systolic function was moderately reduced. The estimated ejection fraction was in the range of 35% to 40%. Akinesis of the basalinferior and apical myocardium. Features are consistent with a pseudonormal left ventricular filling pattern, with concomitant abnormal relaxation and increased filling pressure (grade 2 diastolic dysfunction). - Aortic valve: There was mild regurgitation. - Mitral valve: There was mild  regurgitation. - Left atrium: The atrium was severely dilated.  Patient Profile     67 y.o. male with PMH of CAD (CTO of the RCA), CHF, VT/VF s/p ICD, HL, HTN, DM and OSA on Cpap who presented with acute on chronic HF and PNA.   Assessment & Plan    1. Acute on Chronic combined HF:  -Known EF of 35-40% back in 12/17. Suspect this is a combination of medication noncompliance and dietary indiscretion. He also has not been weighing himself regularly.  -BNP >600 and CXR with edema on admission>>continues to have significant LE swelling   -Continue IV Lasix 40mg  BID>> creatinine, 1.45 today which is at his baseline of 1.2-1.4.  -Consider adding one extra dose of Lasix today or adding metolazone x1 and monitor response  -Weight, 123.3 kg today, 122 kg on admission -I&O, net -1.5 L since admission -Continue BB and ARB  2. CAP:  -Febrile in the ED, with mildly elevated WBC -On antibiotics per primary team  3. Elevated troponin with known CTO of RCA:  -Suspect demand ischemia in the setting of acute HF and CAP -Denies chest pain.  -EKG with known LBBB>no acute ischemic changes -Continue ASA  4. VT/VF s/p ICD: -Followed by Dr. Baird Kay as an outpatient -Stable on last device check 4/19 -Remains on amiodarone 200mg  daily   5. HL:  -On statin  Signed, Kathyrn Drown NP-C [place] Pager: [phone] 05/17/2018, 1:13 PM     For questions or updates, please contact   Please consult [email or web address] for contact info under Cardiology/STEMI.  Attending Note:   The patient was seen and examined.  Agree with assessment and plan as noted above.  Changes made to the above note as needed.  Patient seen and independently examined with  Kathyrn Drown. NP .   We discussed all aspects of the encounter. I agree with the assessment and plan as stated above.  1.  Acute on chronic combined systolic and diastolic congestive heart failure: The patient is diuresed 1.5 L.  His creatinine has bumped  a little bit.  He still has significant leg edema.  We will need to continue IV Lasix for now.  Most of his issues that he does not take Lasix on a regular basis and admits to eating a lot of extra salt.  2.  Elevated troponin level.  Level is extremely low.  Is likely due to demand ischemia.     I have spent a total of 40 minutes with patient reviewing hospital  notes , telemetry, EKGs, labs and examining patient as well as establishing an assessment and plan that was discussed with the patient. > 50% of time was spent in direct patient care.    Thayer Headings, Brooke Bonito., MD, [facility] 05/17/2018, 2:19 PM [address],  [address] Pager [phone]

## 2018-05-17 NOTE — Progress Notes (Signed)
Pt given heart failure booklet. Nursing education via reading materials started. Concerns still arise due to patient's stated preference for convenient food (fast food, canned food, etc). Pt stated a need to buy another weight scale and be more compliant with medication regimen (memory impairment?). Will continue to monitor.

## 2018-05-17 NOTE — Progress Notes (Signed)
Patient ID: Dean [name], male   DOB: [birth year], 67 y.o.   MRN: [id number]                                                                PROGRESS NOTE                                                                                                                                                                                                             Patient Demographics:    Dean [name], is a 67 y.o. male, DOB - [birth year], WUJ:[id number]  Admit date - 05/15/2018   Admitting Physician Norval Morton, MD  Outpatient Primary MD for the patient is [name], MD  LOS - 2  Outpatient Specialists:  Chief Complaint  Patient presents with  . Shortness of Breath       Brief Narrative  67 y.o. male with medical history significant of systolic CHF last EF 35- 95% with grade 2DD in 08/2016, CAD, VT/VF cardiac arrest s/p AICD, DM type II, OSA on CPAP and CKD stage 3; who presents with complaints of progressively worsening shortness of breath over the last 2 weeks.  He reports having progressively worsening lower extremity swelling and a cough with white foamy sputum production that he notes is hard to get up.  Whenever he is trying to get up and get around he complains that his breathing is very labored.  Patient reports sleeping on 2-3 pillows at night and reports that when he went to the doctor's office 2 days ago, for physical exam his weight was actually down 10 pounds.  Other associated symptoms include complaints of subjective fever and malaise.  Patient's daughter is present in the room and notes that she had recently gotten him a new pill dispenser, but patient still have been missing some of his nighttime doses of his medicines.   ED Course:Upon admission into the emergency department patient was seen to have a temperature up to 100.1 F, pulse 94-1 01, respiration 20-37, blood pressures 135/98-209/92, and O2 saturations 94 to 100% on 2 L nasal cannula oxygen.  Labs revealed WBC  10.7, hemoglobin 10.7, potassium 3.4, creatinine 1.29, BUN 679.6, and troponin negative x2.  Patient was given 40 mg of Lasix IV, 30 mEq of potassium chloride, Vasotec 1.25 mg IV,  Subjective:    Dean [name] today state that his breathing is better. Slight cough, but can't tell what came up.  Denies fever, chills, cp, palp, n/v, diarrhea, brbpr.   No headache, No chest pain, No abdominal pain - No Nausea, No new weakness tingling or numbness,   Assessment  & Plan :    Principal Problem:   Acute on chronic combined systolic and diastolic CHF (congestive heart failure) (HCC) Active Problems:   DM2 (diabetes mellitus, type 2) (HCC)   Morbid obesity (HCC)   Diabetes mellitus type 2 in obese (HCC)   Hypokalemia   Stage 3 chronic kidney disease ([place])   ICD (implantable cardioverter-defibrillator) in place   CAD (coronary artery disease)   Normochromic normocytic anemia   CKD (chronic kidney disease), stage III ([place])   Acute on chronic systolic/diastolic CHF.(EF 87-86%), Moderate MR --Improving with diuresis.  Continue IV Lasix. --defer to cardiology regarding whether or not to switch or oral lasix as has mild renal insufficiency this AM --Appreciate cardiology input  Troponin elevation Likely due to demand ischemia  Possible pneumonia.  Chest x-ray unrevealing.  Noted to have cough and low-grade temperature in the emergency department. --Cont Rocephin, Zithromax  Hypertensive urgency on admission, ? Secondary to medication noncompliance --Resolved with diuresis.  CKD stage III --stable  Diabetes mellitus type 2 --stable. Hold metformin. SSI.  Morbid obesity. Body mass index is 45.3 kg/m. --consult dietician     Code Status :  FULL CODE  Family Communication  : w patient  Disposition Plan  : home  Barriers For Discharge:   Consults  :  cardiology  Procedures  :    DVT Prophylaxis  :  Lovenox - - SCDs   Lab Results  Component Value Date   PLT  223 05/15/2018    Antibiotics  :  Rocephin, zithromax 8/21=>   Anti-infectives (From admission, onward)   Start     Dose/Rate Route Frequency Ordered Stop   05/16/18 1200  azithromycin (ZITHROMAX) tablet 500 mg     500 mg Oral Daily 05/15/18 1849     05/16/18 1200  cefTRIAXone (ROCEPHIN) 2 g in sodium chloride 0.9 % 100 mL IVPB     2 g 200 mL/hr over 30 Minutes Intravenous Daily 05/15/18 1849     05/15/18 1630  cefTRIAXone (ROCEPHIN) 2 g in sodium chloride 0.9 % 100 mL IVPB     2 g 200 mL/hr over 30 Minutes Intravenous  Once 05/15/18 1622 05/15/18 1711   05/15/18 1630  azithromycin (ZITHROMAX) tablet 500 mg     500 mg Oral  Once 05/15/18 1622 05/15/18 1633        Objective:   Vitals:   05/16/18 2204 05/16/18 2337 05/17/18 0500 05/17/18 0545  BP:  (!) 125/55  (!) 141/56  Pulse:  64  65  Resp: 18 16  17   Temp:  98.5 F (36.9 C)  98.4 F (36.9 C)  TempSrc:  Oral  Oral  SpO2:  99%  98%  Weight:   123.3 kg   Height:        Wt Readings from Last 3 Encounters:  05/17/18 123.3 kg  03/08/18 123.4 kg  02/19/18 123.4 kg     Intake/Output Summary (Last 24 hours) at 05/17/2018 0756 Last data filed at 05/17/2018 0700 Gross per 24 hour  Intake 623.33 ml  Output 1100 ml  Net -476.67 ml     Physical Exam  Awake Alert, Oriented X 3, No new F.N deficits, Normal  affect [place].AT,PERRAL Supple Neck,No JVD, No cervical lymphadenopathy appriciated.  Symmetrical Chest wall movement, Good air movement bilaterally, slight decrease in bs at bilateral base, CTAB RRR,No Gallops,Rubs or new Murmurs, No Parasternal Heave +ve B.Sounds, Abd Soft, No tenderness, No organomegaly appriciated, No rebound - guarding or rigidity. No Cyanosis, Clubbing.  Edema 1+   No new Rash or bruise       Data Review:    CBC Recent Labs  Lab 05/15/18 0950  WBC 10.7*  HGB 10.7*  HCT 36.4*  PLT 223  MCV 90.5  MCH 26.6  MCHC 29.4*  RDW 15.5    Chemistries  Recent Labs  Lab 05/15/18 0950  05/16/18 0153 05/17/18 0525  NA 140 141 135  K 3.4* 4.4 4.3  CL 107 105 103  CO2 23 27 23   GLUCOSE 129* 98 102*  BUN 17 12 21   CREATININE 1.29* 1.10 1.45*  CALCIUM 9.9 9.7 9.7  MG  --  1.7  --    ------------------------------------------------------------------------------------------------------------------ No results for input(s): CHOL, HDL, LDLCALC, TRIG, CHOLHDL, LDLDIRECT in the last 72 hours.  Lab Results  Component Value Date   HGBA1C 5.5 08/27/2016   ------------------------------------------------------------------------------------------------------------------ No results for input(s): TSH, T4TOTAL, T3FREE, THYROIDAB in the last 72 hours.  Invalid input(s): FREET3 ------------------------------------------------------------------------------------------------------------------ No results for input(s): VITAMINB12, FOLATE, FERRITIN, TIBC, IRON, RETICCTPCT in the last 72 hours.  Coagulation profile No results for input(s): INR, PROTIME in the last 168 hours.  No results for input(s): DDIMER in the last 72 hours.  Cardiac Enzymes Recent Labs  Lab 05/15/18 1849 05/16/18 0153  TROPONINI 0.08* 0.07*   ------------------------------------------------------------------------------------------------------------------    Component Value Date/Time   BNP 679.6 (H) 05/15/2018 0950    Inpatient Medications  Scheduled Meds: . allopurinol  300 mg Oral Daily  . amiodarone  200 mg Oral Daily  . aspirin EC  81 mg Oral Daily  . atorvastatin  40 mg Oral q1800  . azithromycin  500 mg Oral Daily  . carvedilol  25 mg Oral BID WC  . enoxaparin (LOVENOX) injection  40 mg Subcutaneous Q24H  . furosemide  40 mg Intravenous BID  . losartan  100 mg Oral Daily  . magnesium oxide  200 mg Oral BID  . potassium chloride SA  20 mEq Oral Daily  . sodium chloride flush  3 mL Intravenous Q12H   Continuous Infusions: . sodium chloride    . cefTRIAXone (ROCEPHIN)  IV Stopped  (05/16/18 1257)   PRN Meds:.sodium chloride, acetaminophen, hydrALAZINE, ipratropium-albuterol, ondansetron (ZOFRAN) IV, sodium chloride flush  Micro Results No results found for this or any previous visit (from the past 240 hour(s)).  Radiology Reports Dg Chest 2 View  Result Date: 05/15/2018 CLINICAL DATA:  Shortness of breath and chest tightness. EXAM: CHEST - 2 VIEW COMPARISON:  10/06/2017 FINDINGS: The patient has new bilateral interstitial pulmonary edema, right greater than left. Slight pulmonary vascular congestion, new. Chronic mild cardiomegaly. AICD in place. No effusions.  No acute bone abnormality. IMPRESSION: New mild interstitial pulmonary edema and pulmonary vascular congestion consistent with congestive heart failure. Electronically Signed   By: Lorriane Shire M.D.   On: 05/15/2018 10:00    Time Spent in minutes  30   Jani Gravel M.D on 05/17/2018 at 7:56 AM  Between 7am to 7pm - Pager - [phone]    After 7pm go to [email or web address] - password [facility]  [facility] -  Office  [phone]

## 2018-05-17 NOTE — Evaluation (Signed)
Physical Therapy Evaluation Patient Details Name: Dean [name] MRN: [id number] DOB: [birth year] Today's Date: 05/17/2018   History of Present Illness  67 y.o. male with PMH of CAD (CTO of the RCA), CHF, VT/VF s/p ICD, HL, HTN, DM and OSA on Cpap who presented with acute on chronic HF and PNA.   Clinical Impression  Pt admitted with above diagnosis. Pt currently with functional limitations due to the deficits listed below (see PT Problem List). Pt overall appears to be at baseline.  Has all equipment at home and do not feel he will need f/u.  Will follow while in hospital to ensure mobility.  Encouraged to use RW for ultimate safety at home and pt agrees.   Pt will benefit from skilled PT to increase their independence and safety with mobility to allow discharge to the venue listed below.      Follow Up Recommendations No PT follow up;Supervision - Intermittent    Equipment Recommendations  None recommended by PT    Recommendations for Other Services       Precautions / Restrictions Precautions Precautions: Fall Restrictions Weight Bearing Restrictions: No      Mobility  Bed Mobility Overal bed mobility: Independent                Transfers Overall transfer level: Independent                  Ambulation/Gait Ambulation/Gait assistance: Min guard Gait Distance (Feet): 300 Feet Assistive device: None;Rolling walker (2 wheeled) Gait Pattern/deviations: Step-through pattern;Decreased stride length   Gait velocity interpretation: <1.31 ft/sec, indicative of household ambulator General Gait Details: Pt was able to ambulate without device without LOB but was guarded. Pt sats 88% and above on RA.  Also ambulated with RW and pt appears to feel steadier with RW.    Stairs            Wheelchair Mobility    Modified Rankin (Stroke Patients Only)       Balance Overall balance assessment: Needs assistance Sitting-balance support: No upper extremity  supported;Feet supported Sitting balance-Leahy Scale: Fair     Standing balance support: Bilateral upper extremity supported;No upper extremity supported;During functional activity Standing balance-Leahy Scale: Fair Standing balance comment: can stand statically without RW.  Does better with dynamic balance with RW.                              Pertinent Vitals/Pain Pain Assessment: No/denies pain    Home Living Family/patient expects to be discharged to:: Private residence Living Arrangements: Children Available Help at Discharge: Family;Available PRN/intermittently Type of Home: House Home Access: Level entry     Home Layout: Two level;Able to live on main level with bedroom/bathroom Home Equipment: Gilford Rile - 2 wheels;Cane - single point;Bedside commode      Prior Function Level of Independence: Independent         Comments: Pt works in Architect takes on small projects now     [employer]   Dominant Hand: Right    Extremity/Trunk Assessment   Upper Extremity Assessment Upper Extremity Assessment: Defer to OT evaluation    Lower Extremity Assessment Lower Extremity Assessment: Generalized weakness    Cervical / Trunk Assessment Cervical / Trunk Assessment: Normal  Communication   Communication: No difficulties  Cognition Arousal/Alertness: Awake/alert Behavior During Therapy: WFL for tasks assessed/performed Overall Cognitive Status: Within Functional Limits for tasks assessed  General Comments      Exercises     Assessment/Plan    PT Assessment Patient needs continued PT services  PT Problem List Decreased activity tolerance;Decreased balance;Decreased mobility;Decreased knowledge of use of DME       PT Treatment Interventions DME instruction;Gait training;Therapeutic exercise;Therapeutic activities;Functional mobility training;Patient/family education;Balance training     PT Goals (Current goals can be found in the Care Plan section)  Acute Rehab PT Goals Patient Stated Goal: to go home and get around PT Goal Formulation: With patient Time For Goal Achievement: 05/31/18 Potential to Achieve Goals: Good    Frequency Min 3X/week   Barriers to discharge        Co-evaluation               AM-PAC PT "6 Clicks" Daily Activity  Outcome Measure Difficulty turning over in bed (including adjusting bedclothes, sheets and blankets)?: None Difficulty moving from lying on back to sitting on the side of the bed? : None Difficulty sitting down on and standing up from a chair with arms (e.g., wheelchair, bedside commode, etc,.)?: None Help needed moving to and from a bed to chair (including a wheelchair)?: None Help needed walking in hospital room?: A Little Help needed climbing 3-5 steps with a railing? : A Little 6 Click Score: 22    End of Session Equipment Utilized During Treatment: Gait belt Activity Tolerance: Patient limited by fatigue Patient left: in bed;with call bell/phone within reach(sitting on EOB) Nurse Communication: Mobility status PT Visit Diagnosis: Muscle weakness (generalized) (M62.81)    Time: 2263-3354 PT Time Calculation (min) (ACUTE ONLY): 8 min   Charges:   PT Evaluation $PT Eval Low Complexity: [place] Madelina Sanda,PT Acute Rehabilitation [phone] (pager)   Denice Paradise 05/17/2018, 4:50 PM

## 2018-05-17 NOTE — Care Management Note (Signed)
Case Management Note  Patient Details  Name: Dean [name] MRN: [id number] Date of Birth: 09-Jun-1951  Subjective/Objective:  CHF                 Action/Plan: Patient lives at home; PCP: [name], MD; has private insurance with [facility] with prescription drug coverage; awaiting for physical therapy evaluation for disposition needs. CM will continue to follow for progression of care.  Expected Discharge Date:   possibly 05/22/2018               Expected Discharge Plan:  Home/Self Care  Discharge planning Services  CM Consult  Status of Service:  In process, will continue to follow  Sherrilyn Rist [phone] 05/17/2018, 1:18 PM

## 2018-05-18 LAB — CBC
HCT: 30.6 % — ABNORMAL LOW (ref 39.0–52.0)
Hemoglobin: 9.4 g/dL — ABNORMAL LOW (ref 13.0–17.0)
MCH: 26.4 pg (ref 26.0–34.0)
MCHC: 30.7 g/dL (ref 30.0–36.0)
MCV: 86 fL (ref 78.0–100.0)
Platelets: 170 10*3/uL (ref 150–400)
RBC: 3.56 MIL/uL — ABNORMAL LOW (ref 4.22–5.81)
RDW: 15.1 % (ref 11.5–15.5)
WBC: 10.6 10*3/uL — ABNORMAL HIGH (ref 4.0–10.5)

## 2018-05-18 LAB — COMPREHENSIVE METABOLIC PANEL
ALT: 23 U/L (ref 0–44)
AST: 27 U/L (ref 15–41)
Albumin: 3.1 g/dL — ABNORMAL LOW (ref 3.5–5.0)
Alkaline Phosphatase: 91 U/L (ref 38–126)
Anion gap: 9 (ref 5–15)
BUN: 26 mg/dL — ABNORMAL HIGH (ref 8–23)
CO2: 22 mmol/L (ref 22–32)
Calcium: 9.4 mg/dL (ref 8.9–10.3)
Chloride: 104 mmol/L (ref 98–111)
Creatinine, Ser: 1.48 mg/dL — ABNORMAL HIGH (ref 0.61–1.24)
GFR calc Af Amer: 54 mL/min — ABNORMAL LOW (ref >60)
GFR calc non Af Amer: 47 mL/min — ABNORMAL LOW (ref >60)
Glucose, Bld: 89 mg/dL (ref 70–99)
Potassium: 4.5 mmol/L (ref 3.5–5.1)
Sodium: 135 mmol/L (ref 135–145)
Total Bilirubin: 0.9 mg/dL (ref 0.3–1.2)
Total Protein: 6.7 g/dL (ref 6.5–8.1)

## 2018-05-18 LAB — GLUCOSE, CAPILLARY
Glucose-Capillary: 90 mg/dL (ref 70–99)
Glucose-Capillary: 115 mg/dL — ABNORMAL HIGH (ref 70–99)
Glucose-Capillary: 95 mg/dL (ref 70–99)
Glucose-Capillary: 104 mg/dL — ABNORMAL HIGH (ref 70–99)

## 2018-05-18 MED ORDER — DOXYCYCLINE HYCLATE 100 MG PO TABS: 100.0000 mg | ORAL_TABLET | Freq: Two times a day (BID) | ORAL | Status: DC

## 2018-05-18 MED ORDER — DOXYCYCLINE HYCLATE 100 MG PO TABS
100.0000 mg | ORAL_TABLET | Freq: Two times a day (BID) | ORAL | Status: DC
  Administered 2018-05-18 – 2018-05-20 (×4): 100 mg via ORAL
  Filled 2018-05-18 (×4): qty 1

## 2018-05-18 MED ORDER — METOLAZONE 2.5 MG PO TABS
2.5000 mg | ORAL_TABLET | Freq: Once | ORAL | Status: AC
  Administered 2018-05-18: 2.5 mg via ORAL
  Filled 2018-05-18: qty 1

## 2018-05-18 NOTE — Progress Notes (Signed)
Progress Note  Patient Name: Dean [name] Date of Encounter: 05/18/2018  Primary Cardiologist: Dr. Daneen Schick, MD; Dr. Curt Bears, MD  Subjective   Less dyspnea no chest pain Discussed low sodium diet and need to take diuretics daily   Inpatient Medications    Scheduled Meds: . allopurinol  300 mg Oral Daily  . amiodarone  200 mg Oral Daily  . aspirin EC  81 mg Oral Daily  . atorvastatin  40 mg Oral q1800  . azithromycin  500 mg Oral Daily  . carvedilol  25 mg Oral BID WC  . enoxaparin (LOVENOX) injection  40 mg Subcutaneous Q24H  . furosemide  40 mg Intravenous BID  . losartan  100 mg Oral Daily  . magnesium oxide  200 mg Oral BID  . potassium chloride SA  20 mEq Oral Daily  . sodium chloride flush  3 mL Intravenous Q12H   Continuous Infusions: . sodium chloride    . cefTRIAXone (ROCEPHIN)  IV 2 g (05/18/18 0842)   PRN Meds: sodium chloride, acetaminophen, hydrALAZINE, ipratropium-albuterol, ondansetron (ZOFRAN) IV, sodium chloride flush   Vital Signs    Vitals:   05/17/18 2124 05/18/18 0003 05/18/18 0425 05/18/18 0838  BP:  129/67 (!) 143/58 (!) 149/56  Pulse: 74 61 61 64  Resp:  19 18   Temp:  97.8 F (36.6 C) 98.1 F (36.7 C)   TempSrc:  Oral Oral   SpO2:  100% 100%   Weight:   123.4 kg   Height:        Intake/Output Summary (Last 24 hours) at 05/18/2018 1009 Last data filed at 05/18/2018 0915 Gross per 24 hour  Intake 1560 ml  Output 1300 ml  Net 260 ml   Filed Weights   05/16/18 1234 05/17/18 0500 05/18/18 0425  Weight: 123.5 kg 123.3 kg 123.4 kg   Physical Exam   Affect appropriate Morbidly obese black male  HEENT: normal Neck supple with no adenopathy JVP normal no bruits no thyromegaly Lungs clear with no wheezing and good diaphragmatic motion Heart:  S1/S2 distant  no murmur, no rub, gallop or click PMI not palpable AICD under left clavicle  Abdomen: benighn, BS positve, no tenderness, no AAA no bruit.  No HSM or HJR Distal  pulses intact with no bruits Plus 2 bilateral edema especially ankles and dorsum of feet  Neuro non-focal Skin warm and dry No muscular weakness   Labs    Chemistry Recent Labs  Lab 05/16/18 0153 05/17/18 0525 05/18/18 0400  NA 141 135 135  K 4.4 4.3 4.5  CL 105 103 104  CO2 27 23 22   GLUCOSE 98 102* 89  BUN 12 21 26*  CREATININE 1.10 1.45* 1.48*  CALCIUM 9.7 9.7 9.4  PROT  --   --  6.7  ALBUMIN  --   --  3.1*  AST  --   --  27  ALT  --   --  23  ALKPHOS  --   --  91  BILITOT  --   --  0.9  GFRNONAA >60 48* 47*  GFRAA >60 56* 54*  ANIONGAP 9 9 9      Hematology Recent Labs  Lab 05/15/18 0950 05/18/18 0400  WBC 10.7* 10.6*  RBC 4.02* 3.56*  HGB 10.7* 9.4*  HCT 36.4* 30.6*  MCV 90.5 86.0  MCH 26.6 26.4  MCHC 29.4* 30.7  RDW 15.5 15.1  PLT 223 170    Cardiac Enzymes Recent Labs  Lab 05/15/18  1849 05/16/18 0153  TROPONINI 0.08* 0.07*    Recent Labs  Lab 05/15/18 1001 05/15/18 1635  TROPIPOC 0.04 0.04     BNP Recent Labs  Lab 05/15/18 0950  BNP 679.6*     DDimer No results for input(s): DDIMER in the last 168 hours.   Radiology    No results found.  Telemetry    05/17/18 NSR/ST - Personally Reviewed  ECG    No new tracings as of 05/17/2018- Personally Reviewed  Cardiac Studies   Echo: 08/28/16  Study Conclusions  - Left ventricle: The cavity size was mildly dilated. Wall thickness was increased in a pattern of severe LVH. Systolic function was moderately reduced. The estimated ejection fraction was in the range of 35% to 40%. Akinesis of the basalinferior and apical myocardium. Features are consistent with a pseudonormal left ventricular filling pattern, with concomitant abnormal relaxation and increased filling pressure (grade 2 diastolic dysfunction). - Aortic valve: There was mild regurgitation. - Mitral valve: There was mild regurgitation. - Left atrium: The atrium was severely dilated.  Patient Profile      67 y.o. male with PMH of CAD (CTO of the RCA), CHF, VT/VF s/p ICD, HL, HTN, DM and OSA on Cpap who presented with acute on chronic HF and PNA.   Assessment & Plan    1. Acute on Chronic combined HF:  EF 35-40% chronic Exacerbation from dietary indiscretion and not taking diuretic daily BNP >600 continue beta blocker and ARB  2. CAP:  ? Change to PO antibiotics febrile on admission with elevated WBC does not look toxic  3. Elevated troponin with known CTO of RCA:  -Suspect demand ischemia in the setting of acute HF and CAP -Denies chest pain.  -EKG with known LBBB>no acute ischemic changes -Continue ASA no plans for cath   4. VT/VF s/p ICD: -Followed by Dr. Baird Kay as an outpatient -Stable on last device check 4/19 -Remains on amiodarone 200mg  daily   5. HL:  -On statin  Jenkins [place]

## 2018-05-18 NOTE — Progress Notes (Signed)
Called pharmacy regarding doxycycline ordered for 1100 Pt received rocephin IV at 0842 and zithromax PO at 6237  Pharmacy stated he will re schedule dose

## 2018-05-18 NOTE — Progress Notes (Signed)
PROGRESS NOTE  Dean [name] [id number]:[id number] DOB: 24-Dec-1950 DOA: 05/15/2018 PCP: [name], MD   LOS: 3 days   Brief Narrative / Interim history: 67 y.o.malewith medical history significant of systolic CHF last EF 35- 75% with grade 2DD in 08/2016, CAD, VT/VF cardiac arrest s/p AICD, DM type II, OSA on CPAP and CKD stage 3; who presents with complaints of progressively worsening shortness of breath over the last 2 weeks. He reports having progressively worsening lower extremity swelling and a cough with white foamy sputum production that he notes is hard to get up.  He was admitted to the hospital with acute on chronic combined CHF as well as there was concern for pneumonia on admission and patient was placed on antibiotics.  He currently is being diuresed.  Assessment & Plan: Principal Problem:   Acute on chronic combined systolic and diastolic CHF (congestive heart failure) (HCC) Active Problems:   DM2 (diabetes mellitus, type 2) (HCC)   Morbid obesity (HCC)   Diabetes mellitus type 2 in obese (HCC)   Hypokalemia   Stage 3 chronic kidney disease ([place])   ICD (implantable cardioverter-defibrillator) in place   CAD (coronary artery disease)   Normochromic normocytic anemia   CKD (chronic kidney disease), stage III (HCC)   Acute hypoxic respiratory failure in the setting of acute on chronic systolic and diastolic CHF, moderate MR -Patient is on IV Lasix, cardiology consulted and appreciate input -His creatinine has overall remained stable -He is still hypoxic and feels short of breath but improved. -He is net -1.3 L and his weight is around 272 on admission and it is unchanged today.  I will add metolazone x1  Possible pneumonia -Chest x-ray unrevealing, patient was placed on ceftriaxone and Zithromax, convert to doxycycline, will need 3-5 days  Hypertensive urgency -Possibly related to noncompliance, his blood pressure is much better now  Chronic kidney disease stage  III -Baseline creatinine 1.3-1.5 over the last several years, currently at baseline  Normocytic anemia -Of chronic disease, hemoglobin stable  Troponin elevation -Minimal, likely demand  Type 2 diabetes mellitus -Stable, continue sliding scale  Prior VT/Fib cardiac arrest with ICD -stable, continue Amiodarone   DVT prophylaxis: Lovenox Code Status: Full code Family Communication: no family at bedside  Disposition Plan: home when ready  Consultants:   Cardiology   Procedures:   2d echo Impressions: - Compared to a prior study in 2017, the LVEF is lower at 30-35% with grade 3 diastolic dysfunction and high LV filling pressure.  AICD wires are noted.  Antimicrobials:  Ceftriaxone / Azithromycin 8/22 >> 8/24  Doxycycline 8/24   Subjective: -feeling a bit better, less swelling, still dyspneic with ambulation but improving. No chest pain  Objective: Vitals:   05/17/18 2124 05/18/18 0003 05/18/18 0425 05/18/18 0838  BP:  129/67 (!) 143/58 (!) 149/56  Pulse: 74 61 61 64  Resp:  19 18   Temp:  97.8 F (36.6 C) 98.1 F (36.7 C)   TempSrc:  Oral Oral   SpO2:  100% 100%   Weight:   123.4 kg   Height:        Intake/Output Summary (Last 24 hours) at 05/18/2018 1033 Last data filed at 05/18/2018 0915 Gross per 24 hour  Intake 1560 ml  Output 1300 ml  Net 260 ml   Filed Weights   05/16/18 1234 05/17/18 0500 05/18/18 0425  Weight: 123.5 kg 123.3 kg 123.4 kg    Examination:  Constitutional: NAD Eyes: lids and conjunctivae normal ENMT: Mucous  membranes are moist.  Neck: normal, supple Respiratory: clear to auscultation bilaterally, no wheezing, no crackles.  Cardiovascular: Regular rate and rhythm, no murmurs / rubs / gallops. No LE edema. 2+ pedal pulses.  Abdomen: no tenderness. Bowel sounds positive.  Musculoskeletal: no clubbing / cyanosis. Skin: no rashes, l seen Neurologic: non focal    Data Reviewed: I have independently reviewed following labs and  imaging studies   CBC: Recent Labs  Lab 05/15/18 0950 05/18/18 0400  WBC 10.7* 10.6*  HGB 10.7* 9.4*  HCT 36.4* 30.6*  MCV 90.5 86.0  PLT 223 326   Basic Metabolic Panel: Recent Labs  Lab 05/15/18 0950 05/16/18 0153 05/17/18 0525 05/18/18 0400  NA 140 141 135 135  K 3.4* 4.4 4.3 4.5  CL 107 105 103 104  CO2 23 27 23 22   GLUCOSE 129* 98 102* 89  BUN 17 12 21  26*  CREATININE 1.29* 1.10 1.45* 1.48*  CALCIUM 9.9 9.7 9.7 9.4  MG  --  1.7  --   --    GFR: Estimated Creatinine Clearance: 58.3 mL/min (A) (by C-G formula based on SCr of 1.48 mg/dL (H)). Liver Function Tests: Recent Labs  Lab 05/18/18 0400  AST 27  ALT 23  ALKPHOS 91  BILITOT 0.9  PROT 6.7  ALBUMIN 3.1*   No results for input(s): LIPASE, AMYLASE in the last 168 hours. No results for input(s): AMMONIA in the last 168 hours. Coagulation Profile: No results for input(s): INR, PROTIME in the last 168 hours. Cardiac Enzymes: Recent Labs  Lab 05/15/18 1849 05/16/18 0153  TROPONINI 0.08* 0.07*   BNP (last 3 results) No results for input(s): PROBNP in the last 8760 hours. HbA1C: No results for input(s): HGBA1C in the last 72 hours. CBG: Recent Labs  Lab 05/17/18 0822 05/17/18 1231 05/17/18 1753 05/17/18 2055 05/18/18 0804  GLUCAP 95 98 113* 94 90   Lipid Profile: No results for input(s): CHOL, HDL, LDLCALC, TRIG, CHOLHDL, LDLDIRECT in the last 72 hours. Thyroid Function Tests: No results for input(s): TSH, T4TOTAL, FREET4, T3FREE, THYROIDAB in the last 72 hours. Anemia Panel: No results for input(s): VITAMINB12, FOLATE, FERRITIN, TIBC, IRON, RETICCTPCT in the last 72 hours. Urine analysis:    Component Value Date/Time   COLORURINE YELLOW 05/15/2018 [place] 05/15/2018 1705   LABSPEC 1.008 05/15/2018 1705   PHURINE 6.0 05/15/2018 1705   GLUCOSEU NEGATIVE 05/15/2018 1705   HGBUR NEGATIVE 05/15/2018 1705   BILIRUBINUR NEGATIVE 05/15/2018 1705   KETONESUR NEGATIVE  05/15/2018 1705   PROTEINUR 100 (A) 05/15/2018 1705   NITRITE NEGATIVE 05/15/2018 1705   LEUKOCYTESUR TRACE (A) 05/15/2018 1705   Sepsis Labs: Invalid input(s): PROCALCITONIN, LACTICIDVEN  No results found for this or any previous visit (from the past 240 hour(s)).    Radiology Studies: No results found.   Scheduled Meds: . allopurinol  300 mg Oral Daily  . amiodarone  200 mg Oral Daily  . aspirin EC  81 mg Oral Daily  . atorvastatin  40 mg Oral q1800  . azithromycin  500 mg Oral Daily  . carvedilol  25 mg Oral BID WC  . enoxaparin (LOVENOX) injection  40 mg Subcutaneous Q24H  . furosemide  40 mg Intravenous BID  . losartan  100 mg Oral Daily  . magnesium oxide  200 mg Oral BID  . potassium chloride SA  20 mEq Oral Daily  . sodium chloride flush  3 mL Intravenous Q12H   Continuous Infusions: . sodium chloride    .  cefTRIAXone (ROCEPHIN)  IV 2 g (05/18/18 7425)    Marzetta Board, MD, PhD [facility] Pager [phone]  If 7PM-7AM, please contact night-coverage [email or web address] Password [facility] 05/18/2018, 10:33 AM

## 2018-05-18 NOTE — Progress Notes (Signed)
Pt and son given further CHF education at the beginning of this shift. Pt also encouraged to increase ambulation in the hospital and at home after discharge. Will continue to monitor.

## 2018-05-19 LAB — GLUCOSE, CAPILLARY
Glucose-Capillary: 119 mg/dL — ABNORMAL HIGH (ref 70–99)
Glucose-Capillary: 119 mg/dL — ABNORMAL HIGH (ref 70–99)
Glucose-Capillary: 112 mg/dL — ABNORMAL HIGH (ref 70–99)
Glucose-Capillary: 105 mg/dL — ABNORMAL HIGH (ref 70–99)

## 2018-05-19 LAB — BASIC METABOLIC PANEL
Anion gap: 7 (ref 5–15)
BUN: 32 mg/dL — ABNORMAL HIGH (ref 8–23)
CO2: 28 mmol/L (ref 22–32)
Calcium: 9.7 mg/dL (ref 8.9–10.3)
Chloride: 102 mmol/L (ref 98–111)
Creatinine, Ser: 1.68 mg/dL — ABNORMAL HIGH (ref 0.61–1.24)
GFR calc Af Amer: 47 mL/min — ABNORMAL LOW (ref >60)
GFR calc non Af Amer: 40 mL/min — ABNORMAL LOW (ref >60)
Glucose, Bld: 125 mg/dL — ABNORMAL HIGH (ref 70–99)
Potassium: 4.1 mmol/L (ref 3.5–5.1)
Sodium: 137 mmol/L (ref 135–145)

## 2018-05-19 LAB — CBC
HCT: 31 % — ABNORMAL LOW (ref 39.0–52.0)
Hemoglobin: 9.3 g/dL — ABNORMAL LOW (ref 13.0–17.0)
MCH: 26.5 pg (ref 26.0–34.0)
MCHC: 30 g/dL (ref 30.0–36.0)
MCV: 88.3 fL (ref 78.0–100.0)
Platelets: 187 10*3/uL (ref 150–400)
RBC: 3.51 MIL/uL — ABNORMAL LOW (ref 4.22–5.81)
RDW: 15.3 % (ref 11.5–15.5)
WBC: 9.7 10*3/uL (ref 4.0–10.5)

## 2018-05-19 MED ORDER — GUAIFENESIN-DM 100-10 MG/5ML PO SYRP
15.0000 mL | ORAL_SOLUTION | ORAL | Status: DC | PRN
  Administered 2018-05-19 – 2018-05-21 (×3): 15 mL via ORAL
  Filled 2018-05-19 (×3): qty 15

## 2018-05-19 MED ORDER — FUROSEMIDE 40 MG PO TABS
40.0000 mg | ORAL_TABLET | Freq: Two times a day (BID) | ORAL | Status: AC
  Administered 2018-05-19 – 2018-05-20 (×3): 40 mg via ORAL
  Filled 2018-05-19 (×3): qty 1

## 2018-05-19 NOTE — Progress Notes (Signed)
PROGRESS NOTE  Dean [name] YKD:[id number] DOB: [birth year] DOA: 05/15/2018 PCP: [name], MD   LOS: 4 days   Brief Narrative / Interim history: 67 y.o.malewith medical history significant of systolic CHF last EF 35- 39% with grade 2DD in 08/2016, CAD, VT/VF cardiac arrest s/p AICD, DM type II, OSA on CPAP and CKD stage 3; who presents with complaints of progressively worsening shortness of breath over the last 2 weeks. He reports having progressively worsening lower extremity swelling and a cough with white foamy sputum production that he notes is hard to get up.  He was admitted to the hospital with acute on chronic combined CHF as well as there was concern for pneumonia on admission and patient was placed on antibiotics.  He currently is being diuresed.  Assessment & Plan: Principal Problem:   Acute on chronic combined systolic and diastolic CHF (congestive heart failure) (HCC) Active Problems:   DM2 (diabetes mellitus, type 2) (HCC)   Morbid obesity (HCC)   Diabetes mellitus type 2 in obese (HCC)   Hypokalemia   Stage 3 chronic kidney disease ([place])   ICD (implantable cardioverter-defibrillator) in place   CAD (coronary artery disease)   Normochromic normocytic anemia   CKD (chronic kidney disease), stage III (HCC)   Acute hypoxic respiratory failure in the setting of acute on chronic systolic and diastolic CHF, moderate MR -Initially diuresed with IV Lasix, discussed with cardiology Dr. Johnsie Cancel today, creatinine slightly increased will change to p.o. Lasix can continue to monitor for 1 more day, potentially discharge home tomorrow -Wean off oxygen to room air if patient tolerates  Possible pneumonia -Chest x-ray unrevealing, patient was placed on ceftriaxone and Zithromax, convert to doxycycline, finished treatment tomorrow and will not prescribe on discharge.  Will have received 5 days of antibiotics by then  Hypertensive urgency -Possibly related to noncompliance, his  blood pressure is much better now and remains in the 767H systolic  Chronic kidney disease stage III -Baseline creatinine 1.3-1.5 over the last several years, slightly up to 1.6 this morning, discontinue IV Lasix and convert to p.o.  Normocytic anemia -Of chronic disease, hemoglobin stable  Troponin elevation -Minimal, likely demand.  No chest pain  Type 2 diabetes mellitus -Stable, continue sliding scale CBG 119 this morning and keep on current regimen  Prior VT/Fib cardiac arrest with ICD -stable, continue Amiodarone.  No events on telemetry   DVT prophylaxis: Lovenox Code Status: Full code Family Communication: no family at bedside  Disposition Plan: home when ready  Consultants:   Cardiology   Procedures:   2d echo Impressions: - Compared to a prior study in 2017, the LVEF is lower at 30-35% with grade 3 diastolic dysfunction and high LV filling pressure.  AICD wires are noted.  Antimicrobials:  Ceftriaxone / Azithromycin 8/22 >> 8/24  Doxycycline 8/24   Subjective: -Appreciates his lower extremity swelling has improved significantly, denies any shortness of breath, denies any chest pain  Objective: Vitals:   05/19/18 0409 05/19/18 0851 05/19/18 1051 05/19/18 1110  BP: (!) 143/59 (!) 140/59    Pulse: 62 60    Resp: 20     Temp: 98.1 F (36.7 C)     TempSrc: Oral     SpO2: 97%  96% 100%  Weight: 122.8 kg     Height:        Intake/Output Summary (Last 24 hours) at 05/19/2018 1140 Last data filed at 05/19/2018 1051 Gross per 24 hour  Intake 1080 ml  Output 3525 ml  Net -2445 ml   Filed Weights   05/17/18 0500 05/18/18 0425 05/19/18 0409  Weight: 123.3 kg 123.4 kg 122.8 kg    Examination:  Constitutional: No distress, laying flat and does not appear to be short of breath Eyes: No scleral icterus ENMT: mmm Respiratory: Clear to auscultation bilaterally, no crackles heard.  No wheezing Cardiovascular: RRR, no murmurs heard.  Trace peripheral  edema Abdomen: Soft, nontender, nondistended, positive bowel sounds Musculoskeletal: no clubbing / cyanosis. Skin: No rashes seen Neurologic: Equal strength, nonfocal   Data Reviewed: I have independently reviewed following labs and imaging studies   CBC: Recent Labs  Lab 05/15/18 0950 05/18/18 0400 05/19/18 0602  WBC 10.7* 10.6* 9.7  HGB 10.7* 9.4* 9.3*  HCT 36.4* 30.6* 31.0*  MCV 90.5 86.0 88.3  PLT 223 170 322   Basic Metabolic Panel: Recent Labs  Lab 05/15/18 0950 05/16/18 0153 05/17/18 0525 05/18/18 0400 05/19/18 0602  NA 140 141 135 135 137  K 3.4* 4.4 4.3 4.5 4.1  CL 107 105 103 104 102  CO2 23 27 23 22 28   GLUCOSE 129* 98 102* 89 125*  BUN 17 12 21  26* 32*  CREATININE 1.29* 1.10 1.45* 1.48* 1.68*  CALCIUM 9.9 9.7 9.7 9.4 9.7  MG  --  1.7  --   --   --    GFR: Estimated Creatinine Clearance: 51.2 mL/min (A) (by C-G formula based on SCr of 1.68 mg/dL (H)). Liver Function Tests: Recent Labs  Lab 05/18/18 0400  AST 27  ALT 23  ALKPHOS 91  BILITOT 0.9  PROT 6.7  ALBUMIN 3.1*   No results for input(s): LIPASE, AMYLASE in the last 168 hours. No results for input(s): AMMONIA in the last 168 hours. Coagulation Profile: No results for input(s): INR, PROTIME in the last 168 hours. Cardiac Enzymes: Recent Labs  Lab 05/15/18 1849 05/16/18 0153  TROPONINI 0.08* 0.07*   BNP (last 3 results) No results for input(s): PROBNP in the last 8760 hours. HbA1C: No results for input(s): HGBA1C in the last 72 hours. CBG: Recent Labs  Lab 05/18/18 0804 05/18/18 1230 05/18/18 1706 05/18/18 2105 05/19/18 0718  GLUCAP 90 115* 95 104* 119*   Lipid Profile: No results for input(s): CHOL, HDL, LDLCALC, TRIG, CHOLHDL, LDLDIRECT in the last 72 hours. Thyroid Function Tests: No results for input(s): TSH, T4TOTAL, FREET4, T3FREE, THYROIDAB in the last 72 hours. Anemia Panel: No results for input(s): VITAMINB12, FOLATE, FERRITIN, TIBC, IRON, RETICCTPCT in the last  72 hours. Urine analysis:    Component Value Date/Time   COLORURINE YELLOW 05/15/2018 [place] 05/15/2018 1705   LABSPEC 1.008 05/15/2018 1705   PHURINE 6.0 05/15/2018 1705   GLUCOSEU NEGATIVE 05/15/2018 1705   HGBUR NEGATIVE 05/15/2018 1705   BILIRUBINUR NEGATIVE 05/15/2018 1705   KETONESUR NEGATIVE 05/15/2018 1705   PROTEINUR 100 (A) 05/15/2018 1705   NITRITE NEGATIVE 05/15/2018 1705   LEUKOCYTESUR TRACE (A) 05/15/2018 1705   Sepsis Labs: Invalid input(s): PROCALCITONIN, LACTICIDVEN  No results found for this or any previous visit (from the past 240 hour(s)).    Radiology Studies: No results found.   Scheduled Meds: . allopurinol  300 mg Oral Daily  . amiodarone  200 mg Oral Daily  . aspirin EC  81 mg Oral Daily  . atorvastatin  40 mg Oral q1800  . carvedilol  25 mg Oral BID WC  . doxycycline  100 mg Oral Q12H  . enoxaparin (LOVENOX) injection  40 mg Subcutaneous Q24H  .  furosemide  40 mg Oral BID  . losartan  100 mg Oral Daily  . magnesium oxide  200 mg Oral BID  . potassium chloride SA  20 mEq Oral Daily  . sodium chloride flush  3 mL Intravenous Q12H   Continuous Infusions: . sodium chloride      Marzetta Board, MD, PhD [facility] Pager [phone]  If 7PM-7AM, please contact night-coverage [email or web address] Password [email or web address] 05/19/2018, 11:40 AM

## 2018-05-19 NOTE — Progress Notes (Signed)
Progress Note  Patient Name: Dean [name] Date of Encounter: 05/19/2018  Primary Cardiologist: Dr. Daneen Schick, MD; Dr. Curt Bears, MD  Subjective   Less dyspnea no chest pain Discussed low sodium diet and need to take diuretics daily   Inpatient Medications    Scheduled Meds: . allopurinol  300 mg Oral Daily  . amiodarone  200 mg Oral Daily  . aspirin EC  81 mg Oral Daily  . atorvastatin  40 mg Oral q1800  . carvedilol  25 mg Oral BID WC  . doxycycline  100 mg Oral Q12H  . enoxaparin (LOVENOX) injection  40 mg Subcutaneous Q24H  . furosemide  40 mg Intravenous BID  . losartan  100 mg Oral Daily  . magnesium oxide  200 mg Oral BID  . potassium chloride SA  20 mEq Oral Daily  . sodium chloride flush  3 mL Intravenous Q12H   Continuous Infusions: . sodium chloride     PRN Meds: sodium chloride, acetaminophen, hydrALAZINE, ipratropium-albuterol, ondansetron (ZOFRAN) IV, sodium chloride flush   Vital Signs    Vitals:   05/18/18 1959 05/19/18 0014 05/19/18 0409 05/19/18 0851  BP: (!) 117/47 (!) 132/55 (!) 143/59 (!) 140/59  Pulse: (!) 56 62 62 60  Resp: 18 18 20    Temp: 98 F (36.7 C) 97.7 F (36.5 C) 98.1 F (36.7 C)   TempSrc: Oral Oral Oral   SpO2: 100% 100% 97%   Weight:   122.8 kg   Height:        Intake/Output Summary (Last 24 hours) at 05/19/2018 1025 Last data filed at 05/19/2018 0851 Gross per 24 hour  Intake 1080 ml  Output 2625 ml  Net -1545 ml   Filed Weights   05/17/18 0500 05/18/18 0425 05/19/18 0409  Weight: 123.3 kg 123.4 kg 122.8 kg   Physical Exam   Affect appropriate Morbidly obese black male  HEENT: normal Neck supple with no adenopathy JVP normal no bruits no thyromegaly Lungs clear with no wheezing and good diaphragmatic motion Heart:  S1/S2 distant  no murmur, no rub, gallop or click PMI not palpable AICD under left clavicle  Abdomen: benighn, BS positve, no tenderness, no AAA no bruit.  No HSM or HJR Distal pulses  intact with no bruits Plus 2 bilateral edema especially ankles and dorsum of feet  Neuro non-focal Skin warm and dry No muscular weakness   Labs    Chemistry Recent Labs  Lab 05/17/18 0525 05/18/18 0400 05/19/18 0602  NA 135 135 137  K 4.3 4.5 4.1  CL 103 104 102  CO2 23 22 28   GLUCOSE 102* 89 125*  BUN 21 26* 32*  CREATININE 1.45* 1.48* 1.68*  CALCIUM 9.7 9.4 9.7  PROT  --  6.7  --   ALBUMIN  --  3.1*  --   AST  --  27  --   ALT  --  23  --   ALKPHOS  --  91  --   BILITOT  --  0.9  --   GFRNONAA 48* 47* 40*  GFRAA 56* 54* 47*  ANIONGAP 9 9 7      Hematology Recent Labs  Lab 05/15/18 0950 05/18/18 0400 05/19/18 0602  WBC 10.7* 10.6* 9.7  RBC 4.02* 3.56* 3.51*  HGB 10.7* 9.4* 9.3*  HCT 36.4* 30.6* 31.0*  MCV 90.5 86.0 88.3  MCH 26.6 26.4 26.5  MCHC 29.4* 30.7 30.0  RDW 15.5 15.1 15.3  PLT 223 170 187  Cardiac Enzymes Recent Labs  Lab 05/15/18 1849 05/16/18 0153  TROPONINI 0.08* 0.07*    Recent Labs  Lab 05/15/18 1001 05/15/18 1635  TROPIPOC 0.04 0.04     BNP Recent Labs  Lab 05/15/18 0950  BNP 679.6*     DDimer No results for input(s): DDIMER in the last 168 hours.   Radiology    No results found.  Telemetry    05/17/18 NSR/ST - Personally Reviewed  ECG    No new tracings as of 05/17/2018- Personally Reviewed  Cardiac Studies   Echo: 08/28/16  Study Conclusions  - Left ventricle: The cavity size was mildly dilated. Wall thickness was increased in a pattern of severe LVH. Systolic function was moderately reduced. The estimated ejection fraction was in the range of 35% to 40%. Akinesis of the basalinferior and apical myocardium. Features are consistent with a pseudonormal left ventricular filling pattern, with concomitant abnormal relaxation and increased filling pressure (grade 2 diastolic dysfunction). - Aortic valve: There was mild regurgitation. - Mitral valve: There was mild regurgitation. - Left  atrium: The atrium was severely dilated.  Patient Profile     67 y.o. male with PMH of CAD (CTO of the RCA), CHF, VT/VF s/p ICD, HL, HTN, DM and OSA on Cpap who presented with acute on chronic HF and PNA.   Assessment & Plan    1. Acute on Chronic combined HF:  EF 35-40% chronic Exacerbation from dietary indiscretion and not taking diuretic daily BNP >600 continue beta blocker and ARB got a dose of zaroxyln yesterday change to PO lasix Likely d/c in am   2. CAP:  ? Change to PO antibiotics febrile on admission with elevated WBC does not look toxic  3. Elevated troponin with known CTO of RCA:  -Suspect demand ischemia in the setting of acute HF and CAP -Denies chest pain.  -EKG with known LBBB>no acute ischemic changes -Continue ASA no plans for cath   4. VT/VF s/p ICD: -Followed by Dr. Baird Kay as an outpatient -Stable on last device check 4/19 -Remains on amiodarone 200mg  daily   5. HL:  -On statin  [place]

## 2018-05-19 NOTE — Progress Notes (Signed)
Educated pt on weaning off oxygen  Pt on room air at this time, no complaints of shortness of breath, saturations within normal limits  Will continue to monitor

## 2018-05-20 DIAGNOSIS — N183 Chronic kidney disease, stage 3 (moderate): Secondary | ICD-10-CM

## 2018-05-20 DIAGNOSIS — E876 Hypokalemia: Secondary | ICD-10-CM

## 2018-05-20 LAB — BASIC METABOLIC PANEL
Anion gap: 9 (ref 5–15)
BUN: 30 mg/dL — ABNORMAL HIGH (ref 8–23)
CO2: 27 mmol/L (ref 22–32)
Calcium: 9.9 mg/dL (ref 8.9–10.3)
Chloride: 102 mmol/L (ref 98–111)
Creatinine, Ser: 1.66 mg/dL — ABNORMAL HIGH (ref 0.61–1.24)
GFR calc Af Amer: 47 mL/min — ABNORMAL LOW (ref >60)
GFR calc non Af Amer: 41 mL/min — ABNORMAL LOW (ref >60)
Glucose, Bld: 85 mg/dL (ref 70–99)
Potassium: 4.7 mmol/L (ref 3.5–5.1)
Sodium: 138 mmol/L (ref 135–145)

## 2018-05-20 LAB — GLUCOSE, CAPILLARY
Glucose-Capillary: 91 mg/dL (ref 70–99)
Glucose-Capillary: 92 mg/dL (ref 70–99)
Glucose-Capillary: 98 mg/dL (ref 70–99)
Glucose-Capillary: 107 mg/dL — ABNORMAL HIGH (ref 70–99)

## 2018-05-20 MED ORDER — SPIRONOLACTONE 12.5 MG HALF TABLET
12.5000 mg | ORAL_TABLET | Freq: Every day | ORAL | Status: DC
  Administered 2018-05-20 – 2018-05-21 (×2): 12.5 mg via ORAL
  Filled 2018-05-20 (×2): qty 1

## 2018-05-20 MED ORDER — FUROSEMIDE 80 MG PO TABS: 80.0000 mg | ORAL_TABLET | Freq: Every day | ORAL | Status: DC

## 2018-05-20 MED ORDER — SACUBITRIL-VALSARTAN 24-26 MG PO TABS
1.0000 | ORAL_TABLET | Freq: Two times a day (BID) | ORAL | Status: DC
  Administered 2018-05-21: 1 via ORAL
  Filled 2018-05-20 (×2): qty 1

## 2018-05-20 NOTE — Progress Notes (Signed)
Placed patient on CPAP, via nasal mask previous settings of 8.0 cm H20 with 2 lpm O2 bleed in.  Patients states "too much pressure". Changed settings to auto titrate (max 18, min 4.0) cm H20. Patient tolerating well at this time.

## 2018-05-20 NOTE — Progress Notes (Signed)
Physical Therapy Treatment Patient Details Name: Dean [name] MRN: [id number] DOB: 1951/05/16 Today's Date: 05/20/2018    History of Present Illness 67 y.o. male with PMH of CAD (CTO of the RCA), CHF, VT/VF s/p ICD, HL, HTN, DM and OSA on Cpap who presented with acute on chronic HF and PNA.     PT Comments    Pt received sitting EOB, agreeable to participation in therapy. He ambulated 350 feet in hallway with RW supervision, SpO2 93%. No SOB noted. Current plan remains appropriate.   Follow Up Recommendations  No PT follow up;Supervision - Intermittent     Equipment Recommendations  None recommended by PT    Recommendations for Other Services       Precautions / Restrictions Precautions Precautions: Fall    Mobility  Bed Mobility Overal bed mobility: Independent                Transfers Overall transfer level: Independent Equipment used: Ambulation equipment used                Ambulation/Gait Ambulation/Gait assistance: Supervision;Min guard Gait Distance (Feet): 350 Feet Assistive device: None;Rolling walker (2 wheeled) Gait Pattern/deviations: Step-through pattern;Decreased stride length Gait velocity: decreased Gait velocity interpretation: <1.31 ft/sec, indicative of household ambulator General Gait Details: Pt ambulated in hallway with RW supervision, decreased foot clearance bilat 25% of time. Pt ambulated in room without AD min guard assist, pt reaching for sink, bed, etc for support.    Stairs             Wheelchair Mobility    Modified Rankin (Stroke Patients Only)       Balance Overall balance assessment: Needs assistance Sitting-balance support: No upper extremity supported;Feet supported Sitting balance-Leahy Scale: Good     Standing balance support: During functional activity;No upper extremity supported Standing balance-Leahy Scale: Fair                              Cognition Arousal/Alertness:  Awake/alert Behavior During Therapy: WFL for tasks assessed/performed Overall Cognitive Status: Within Functional Limits for tasks assessed                                        Exercises      General Comments        Pertinent Vitals/Pain Pain Assessment: No/denies pain    Home Living                      Prior Function            PT Goals (current goals can now be found in the care plan section) Acute Rehab PT Goals Patient Stated Goal: to go home and get around PT Goal Formulation: With patient Time For Goal Achievement: 05/31/18 Potential to Achieve Goals: Good Progress towards PT goals: Progressing toward goals    Frequency    Min 3X/week      PT Plan Current plan remains appropriate    Co-evaluation              AM-PAC PT "6 Clicks" Daily Activity  Outcome Measure  Difficulty turning over in bed (including adjusting bedclothes, sheets and blankets)?: None Difficulty moving from lying on back to sitting on the side of the bed? : None Difficulty sitting down on and standing up from a chair with arms (e.g.,  wheelchair, bedside commode, etc,.)?: None Help needed moving to and from a bed to chair (including a wheelchair)?: None Help needed walking in hospital room?: None Help needed climbing 3-5 steps with a railing? : A Little 6 Click Score: 23    End of Session Equipment Utilized During Treatment: Gait belt Activity Tolerance: Patient tolerated treatment well Patient left: in bed;with call bell/phone within reach Nurse Communication: Mobility status PT Visit Diagnosis: Muscle weakness (generalized) (M62.81)     Time: 1595-3967 PT Time Calculation (min) (ACUTE ONLY): 14 min  Charges:  $Gait Training: 8-22 mins                     Lorrin Goodell, PT  Office # [phone] Pager [phone]    Lorriane Shire 05/20/2018, 9:56 AM

## 2018-05-20 NOTE — Progress Notes (Addendum)
Progress Note  Patient Name: Dean [name] Date of Encounter: 05/20/2018  Primary Cardiologist: Will Meredith Leeds, MD   Subjective   Breathing is improved.  No chest discomfort. Occasionally misses dose of meds in the PM.  Inpatient Medications    Scheduled Meds: . allopurinol  300 mg Oral Daily  . amiodarone  200 mg Oral Daily  . aspirin EC  81 mg Oral Daily  . atorvastatin  40 mg Oral q1800  . carvedilol  25 mg Oral BID WC  . doxycycline  100 mg Oral Q12H  . enoxaparin (LOVENOX) injection  40 mg Subcutaneous Q24H  . furosemide  40 mg Oral BID  . losartan  100 mg Oral Daily  . magnesium oxide  200 mg Oral BID  . potassium chloride SA  20 mEq Oral Daily  . sodium chloride flush  3 mL Intravenous Q12H   Continuous Infusions: . sodium chloride     PRN Meds: sodium chloride, acetaminophen, guaiFENesin-dextromethorphan, hydrALAZINE, ipratropium-albuterol, ondansetron (ZOFRAN) IV, sodium chloride flush   Vital Signs    Vitals:   05/19/18 2020 05/19/18 2310 05/20/18 0556 05/20/18 1242  BP: (!) 110/50  (!) 140/55 (!) 132/59  Pulse: 61 62 (!) 58 60  Resp: 18 18 16    Temp: 98.4 F (36.9 C)  97.6 F (36.4 C) 98.1 F (36.7 C)  TempSrc: Oral  Oral Oral  SpO2: 99% 98% 97% 95%  Weight:   121.6 kg   Height:        Intake/Output Summary (Last 24 hours) at 05/20/2018 1309 Last data filed at 05/20/2018 0841 Gross per 24 hour  Intake 1203 ml  Output 2075 ml  Net -872 ml   Filed Weights   05/18/18 0425 05/19/18 0409 05/20/18 0556  Weight: 123.4 kg 122.8 kg 121.6 kg    Telemetry    Sinus rhythm with occasional PVCs- Personally Reviewed  ECG    Performed 05/15/2018 demonstrates sinus rhythm, interventricular conduction delay, and extreme right axis deviation.- Personally Reviewed  Physical Exam  Morbid obesity GEN: No acute distress.   Neck: No JVD Cardiac: RRR, no murmurs, rubs, or gallops.  Respiratory: Clear to auscultation bilaterally. GI: Soft,  nontender, non-distended  MS: No edema; No deformity. Neuro:  Nonfocal  Psych: Normal affect   Labs    Chemistry Recent Labs  Lab 05/18/18 0400 05/19/18 0602 05/20/18 0621  NA 135 137 138  K 4.5 4.1 4.7  CL 104 102 102  CO2 22 28 27   GLUCOSE 89 125* 85  BUN 26* 32* 30*  CREATININE 1.48* 1.68* 1.66*  CALCIUM 9.4 9.7 9.9  PROT 6.7  --   --   ALBUMIN 3.1*  --   --   AST 27  --   --   ALT 23  --   --   ALKPHOS 91  --   --   BILITOT 0.9  --   --   GFRNONAA 47* 40* 41*  GFRAA 54* 47* 47*  ANIONGAP 9 7 9      Hematology Recent Labs  Lab 05/15/18 0950 05/18/18 0400 05/19/18 0602  WBC 10.7* 10.6* 9.7  RBC 4.02* 3.56* 3.51*  HGB 10.7* 9.4* 9.3*  HCT 36.4* 30.6* 31.0*  MCV 90.5 86.0 88.3  MCH 26.6 26.4 26.5  MCHC 29.4* 30.7 30.0  RDW 15.5 15.1 15.3  PLT 223 170 187    Cardiac Enzymes Recent Labs  Lab 05/15/18 1849 05/16/18 0153  TROPONINI 0.08* 0.07*    Recent Labs  Lab 05/15/18  1001 05/15/18 1635  TROPIPOC 0.04 0.04     BNP Recent Labs  Lab 05/15/18 0950  BNP 679.6*     DDimer No results for input(s): DDIMER in the last 168 hours.   Radiology    No results found.  Cardiac Studies   2D Doppler echocardiogram May 16, 2018: Study Conclusions   - Left ventricle: The cavity size was normal. Wall thickness was   increased in a pattern of moderate LVH. Systolic function was   moderately to severely reduced. The estimated ejection fraction   was in the range of 30% to 35%. No mural thrombus by Definity   contrast. Diffuse hypokinesis. Doppler parameters are consistent   with restrictive left ventricular relaxation (grade 3 diastolic   dysfunction). The E/e&' ratio is >20, suggesting markedly elevated   LV filling pressure. - Aortic valve: Sclerosis without stenosis. There was trivial   regurgitation. - Mitral valve: Mildly thickened leaflets . There was moderate   regurgitation. - Left atrium: Severely dilated. - Right ventricle: The  cavity size was mildly dilated. AICD noted   in right ventricle. - Right atrium: The atrium was mildly dilated. AICD noted in right   atrium. - Tricuspid valve: There was moderate regurgitation. - Pulmonary arteries: PA peak pressure: 52 mm Hg (S). - Inferior vena cava: The vessel was normal in size. The   respirophasic diameter changes were in the normal range (= 50%),   consistent with normal central venous pressure.   Impressions:   - Compared to a prior study in 2017, the LVEF is lower at 30-35%   with grade 3 diastolic dysfunction and high LV filling pressure.   AICD wires are noted.     Patient Profile     67 y.o. male PMH of CAD (CTO of the RCA), CHF, VT/VF s/p ICD, HL, HTN, DM and OSA on Cpap who presented with acute on chronic HF and PNA.    Assessment & Plan    1. Acute on chronic combined systolic and diastolic heart failure: Plan guideline directed therapy of HFrEF: Angiotensin/renin system blockade, beta-blocker therapy, and mineralocorticoid receptor antagonists as tolerated by kidney function. 2. 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 CAD with known chronic occlusion of RCA  4. CKD stage III 5. Elevated troponin secondary to demand  Plan to start very low-dose mineralocorticoid inhibitor therapy-Aldactone 12.5 mg daily.  It would be to the patient's advantage to start Entresto at some point and discontinue ARB.  Will speak with the patient to see if he is likely to afford and comply with guideline directed therapy. After discussion, will start Entresto in AM. Hold AM diuretic dose until several hours after Entresto.      For questions or updates, please contact [place] Please consult [email or web address] for contact info under Cardiology/STEMI.      Signed, Sinclair Grooms, MD  05/20/2018, 1:09 PM

## 2018-05-20 NOTE — Progress Notes (Signed)
Benefit check in progress for Entresto. B Melyssa Signor RN,MHA,BSN [phone] 

## 2018-05-20 NOTE — Care Management (Signed)
05-20-18  BENEFITS CHECK :  # 7.   S / W   SUSAN  @  [employer] #  [phone]    ENTRESTO   24-26 MG BID COVER- YES CO-PAY- $ 8.50 TIER- NO PRIOR APPROVAL- NO  PREFERRED PHARMACY :  YES   [facility] 90 DAY SUPPLY AT RETAIL $ 8.50

## 2018-05-20 NOTE — Progress Notes (Signed)
PROGRESS NOTE  Dean [name] EHU:[id number] DOB: December 11, 1950 DOA: 05/15/2018 PCP: [name], MD   LOS: 5 days   Brief Narrative / Interim history: 67 y.o.malewith medical history significant of systolic CHF last EF 35- 78% with grade 2DD in 08/2016, CAD, VT/VF cardiac arrest s/p AICD, DM type II, OSA on CPAP and CKD stage 3; who presents with complaints of progressively worsening shortness of breath over the last 2 weeks. He reports having progressively worsening lower extremity swelling and a cough with white foamy sputum production that he notes is hard to get up.  He was admitted to the hospital with acute on chronic combined CHF as well as there was concern for pneumonia on admission and patient was placed on antibiotics.  He currently is being diuresed.  Assessment & Plan: Principal Problem:   Acute on chronic combined systolic and diastolic CHF (congestive heart failure) (HCC) Active Problems:   DM2 (diabetes mellitus, type 2) (HCC)   Morbid obesity (HCC)   Diabetes mellitus type 2 in obese (HCC)   Hypokalemia   Stage 3 chronic kidney disease ([place])   ICD (implantable cardioverter-defibrillator) in place   CAD (coronary artery disease)   Normochromic normocytic anemia   CKD (chronic kidney disease), stage III (HCC)   Acute hypoxic respiratory failure in the setting of acute on chronic systolic and diastolic CHF, moderate MR -Initially diuresed with IV Lasix, discussed with cardiology Dr. Tamala Julian today, convert to po diuretics, initiate Entresto and stop ARB -home tomorrow pm if he tolerates   Possible pneumonia -Chest x-ray unrevealing, patient was placed on ceftriaxone and Zithromax, convert to doxycycline, s/p 5 days total, stop doxy today  Hypertensive urgency -Possibly related to noncompliance, his blood pressure is much better now  -will see how he does with Entresto  Chronic kidney disease stage III -Baseline creatinine 1.3-1.5 over the last several years, slightly  up to 1.6 after Lasix, stable today. Will recheck in am   Normocytic anemia -Of chronic disease, hemoglobin stable  Troponin elevation -Minimal, likely demand.  No chest pain  Type 2 diabetes mellitus -Stable, continue sliding scale  Prior VT/Fib cardiac arrest with ICD -stable, continue Amiodarone.  No events on telemetry   DVT prophylaxis: Lovenox Code Status: Full code Family Communication: no family at bedside  Disposition Plan: home when ready  Consultants:   Cardiology   Procedures:   2d echo Impressions: - Compared to a prior study in 2017, the LVEF is lower at 30-35% with grade 3 diastolic dysfunction and high LV filling pressure.  AICD wires are noted.  Antimicrobials:  Ceftriaxone / Azithromycin 8/22 >> 8/24  Doxycycline 8/24   Subjective: -feels better, wants to go home   Objective: Vitals:   05/19/18 2020 05/19/18 2310 05/20/18 0556 05/20/18 1242  BP: (!) 110/50  (!) 140/55 (!) 132/59  Pulse: 61 62 (!) 58 60  Resp: 18 18 16    Temp: 98.4 F (36.9 C)  97.6 F (36.4 C) 98.1 F (36.7 C)  TempSrc: Oral  Oral Oral  SpO2: 99% 98% 97% 95%  Weight:   121.6 kg   Height:        Intake/Output Summary (Last 24 hours) at 05/20/2018 1359 Last data filed at 05/20/2018 0841 Gross per 24 hour  Intake 1203 ml  Output 2075 ml  Net -872 ml   Filed Weights   05/18/18 0425 05/19/18 0409 05/20/18 0556  Weight: 123.4 kg 122.8 kg 121.6 kg    Examination:  Constitutional: NAD Eyes: no icterus ENMT:  mmm Respiratory: CTA biL without crackles or wheezing  Cardiovascular: RRR without murmurs, trace LE edema  Abdomen: soft, NT, ND Skin: no rashes seen Neurologic: non focal, ambulatory    Data Reviewed: I have independently reviewed following labs and imaging studies   CBC: Recent Labs  Lab 05/15/18 0950 05/18/18 0400 05/19/18 0602  WBC 10.7* 10.6* 9.7  HGB 10.7* 9.4* 9.3*  HCT 36.4* 30.6* 31.0*  MCV 90.5 86.0 88.3  PLT 223 170 277   Basic  Metabolic Panel: Recent Labs  Lab 05/16/18 0153 05/17/18 0525 05/18/18 0400 05/19/18 0602 05/20/18 0621  NA 141 135 135 137 138  K 4.4 4.3 4.5 4.1 4.7  CL 105 103 104 102 102  CO2 27 23 22 28 27   GLUCOSE 98 102* 89 125* 85  BUN 12 21 26* 32* 30*  CREATININE 1.10 1.45* 1.48* 1.68* 1.66*  CALCIUM 9.7 9.7 9.4 9.7 9.9  MG 1.7  --   --   --   --    GFR: Estimated Creatinine Clearance: 51.5 mL/min (A) (by C-G formula based on SCr of 1.66 mg/dL (H)). Liver Function Tests: Recent Labs  Lab 05/18/18 0400  AST 27  ALT 23  ALKPHOS 91  BILITOT 0.9  PROT 6.7  ALBUMIN 3.1*   No results for input(s): LIPASE, AMYLASE in the last 168 hours. No results for input(s): AMMONIA in the last 168 hours. Coagulation Profile: No results for input(s): INR, PROTIME in the last 168 hours. Cardiac Enzymes: Recent Labs  Lab 05/15/18 1849 05/16/18 0153  TROPONINI 0.08* 0.07*   BNP (last 3 results) No results for input(s): PROBNP in the last 8760 hours. HbA1C: No results for input(s): HGBA1C in the last 72 hours. CBG: Recent Labs  Lab 05/19/18 1243 05/19/18 1612 05/19/18 2103 05/20/18 0734 05/20/18 1121  GLUCAP 119* 112* 105* 91 92   Lipid Profile: No results for input(s): CHOL, HDL, LDLCALC, TRIG, CHOLHDL, LDLDIRECT in the last 72 hours. Thyroid Function Tests: No results for input(s): TSH, T4TOTAL, FREET4, T3FREE, THYROIDAB in the last 72 hours. Anemia Panel: No results for input(s): VITAMINB12, FOLATE, FERRITIN, TIBC, IRON, RETICCTPCT in the last 72 hours. Urine analysis:    Component Value Date/Time   COLORURINE YELLOW 05/15/2018 [place] 05/15/2018 1705   LABSPEC 1.008 05/15/2018 1705   PHURINE 6.0 05/15/2018 1705   GLUCOSEU NEGATIVE 05/15/2018 1705   HGBUR NEGATIVE 05/15/2018 1705   BILIRUBINUR NEGATIVE 05/15/2018 1705   KETONESUR NEGATIVE 05/15/2018 1705   PROTEINUR 100 (A) 05/15/2018 1705   NITRITE NEGATIVE 05/15/2018 1705   LEUKOCYTESUR TRACE (A)  05/15/2018 1705   Sepsis Labs: Invalid input(s): PROCALCITONIN, LACTICIDVEN  No results found for this or any previous visit (from the past 240 hour(s)).    Radiology Studies: No results found.   Scheduled Meds: . allopurinol  300 mg Oral Daily  . amiodarone  200 mg Oral Daily  . aspirin EC  81 mg Oral Daily  . atorvastatin  40 mg Oral q1800  . carvedilol  25 mg Oral BID WC  . doxycycline  100 mg Oral Q12H  . enoxaparin (LOVENOX) injection  40 mg Subcutaneous Q24H  . furosemide  40 mg Oral BID  . magnesium oxide  200 mg Oral BID  . potassium chloride SA  20 mEq Oral Daily  . [START ON 05/21/2018] sacubitril-valsartan  1 tablet Oral BID  . sodium chloride flush  3 mL Intravenous Q12H  . spironolactone  12.5 mg Oral Daily   Continuous  Infusions: . sodium chloride      Marzetta Board, MD, PhD [facility] Pager [phone]  If 7PM-7AM, please contact night-coverage [email or web address] Password [facility] 05/20/2018, 1:59 PM

## 2018-05-20 NOTE — Progress Notes (Signed)
Pt daughter has request for hose for pt's legs due to swelling, paged MD to advise

## 2018-05-20 NOTE — Care Management Important Message (Signed)
Important Message  Patient Details  Name: Dean [name] MRN: [id number] Date of Birth: 1951/04/17   Medicare Important Message Given:  Yes    Barb Merino Hobgood 05/20/2018, 12:26 PM

## 2018-05-21 ENCOUNTER — Other Ambulatory Visit: Payer: Self-pay | Admitting: Cardiology

## 2018-05-21 ENCOUNTER — Other Ambulatory Visit: Payer: Medicare HMO

## 2018-05-21 DIAGNOSIS — Z95810 Presence of automatic (implantable) cardiac defibrillator: Secondary | ICD-10-CM

## 2018-05-21 LAB — BASIC METABOLIC PANEL
Anion gap: 7 (ref 5–15)
BUN: 36 mg/dL — ABNORMAL HIGH (ref 8–23)
CO2: 28 mmol/L (ref 22–32)
Calcium: 9.9 mg/dL (ref 8.9–10.3)
Chloride: 104 mmol/L (ref 98–111)
Creatinine, Ser: 1.63 mg/dL — ABNORMAL HIGH (ref 0.61–1.24)
GFR calc Af Amer: 48 mL/min — ABNORMAL LOW (ref >60)
GFR calc non Af Amer: 42 mL/min — ABNORMAL LOW (ref >60)
Glucose, Bld: 83 mg/dL (ref 70–99)
Potassium: 4.2 mmol/L (ref 3.5–5.1)
Sodium: 139 mmol/L (ref 135–145)

## 2018-05-21 LAB — GLUCOSE, CAPILLARY
Glucose-Capillary: 86 mg/dL (ref 70–99)
Glucose-Capillary: 92 mg/dL (ref 70–99)

## 2018-05-21 MED ORDER — FUROSEMIDE 40 MG PO TABS
40.0000 mg | ORAL_TABLET | Freq: Every day | ORAL | Status: DC
  Administered 2018-05-21: 40 mg via ORAL
  Filled 2018-05-21: qty 1

## 2018-05-21 MED ORDER — FUROSEMIDE 40 MG PO TABS: 60.0000 mg | ORAL_TABLET | Freq: Every day | ORAL | 0 refills | Status: DC

## 2018-05-21 MED ORDER — SPIRONOLACTONE 25 MG PO TABS: 12.5000 mg | ORAL_TABLET | Freq: Every day | ORAL | 0 refills | Status: DC

## 2018-05-21 MED ORDER — SACUBITRIL-VALSARTAN 24-26 MG PO TABS: 1.0000 | ORAL_TABLET | Freq: Two times a day (BID) | ORAL | 0 refills | Status: DC

## 2018-05-21 NOTE — Progress Notes (Signed)
Entresto coupon card given to patient with instructions on usage. B Francena Zender RN,MHA,BSN [phone] 

## 2018-05-21 NOTE — Progress Notes (Signed)
Paged PA for Dr Tamala Julian with lasix orders after entresto as ordered, vitals in epic

## 2018-05-21 NOTE — Discharge Summary (Signed)
Physician Discharge Summary  Dean [name] [id number]:[id number] DOB: August 05, 1951 DOA: 05/15/2018  PCP: [name], MD  Admit date: 05/15/2018 Discharge date: 05/21/2018  Admitted From: Home Disposition: Home  Recommendations for Outpatient Follow-up:  1. Follow up with cardiology as scheduled in 1 week 2. Please obtain a BMP in 1 week  Home Health: None Equipment/Devices: None  Discharge Condition: Stable CODE STATUS: Full code Diet recommendation: Low-salt, heart healthy  HPI: Per Dr. Tamala Julian, Dean [name] is a 67 y.o. male with medical history significant of systolic CHF last EF 70- 91% with grade 2DD in 08/2016, CAD, VT/VF cardiac arrest s/p AICD, DM type II, OSA on CPAP and CKD stage 3; who presents with complaints of progressively worsening shortness of breath over the last 2 weeks.  He reports having progressively worsening lower extremity swelling and a cough with white foamy sputum production that he notes is hard to get up.  Whenever he is trying to get up and get around he complains that his breathing is very labored.  Patient reports sleeping on 2-3 pillows at night and reports that when he went to the doctor's office 2 days ago, for physical exam his weight was actually down 10 pounds.  Other associated symptoms include complaints of subjective fever and malaise.  Patient's daughter is present in the room and notes that she had recently gotten him a new pill dispenser, but patient still have been missing some of his nighttime doses of his medicines.  Hospital Course: Acute hypoxic respiratory failure in the setting of acute on chronic systolic and diastolic CHF, moderate MR -patient was admitted to the hospital with hypoxia, cardiology was consulted and followed patient while hospitalized.  He was initially diuresed with IV Lasix, and this was converted to p.o. when he was euvolemic.  His home ARB was discontinued and he was initiated on Entresto, and spironolactone was also added  to his regimen.  He tolerated these well, discussed with cardiology and was discharged home in stable condition and will have outpatient follow-up within a week.  His respiratory status returned to baseline, he is on room air, able to ambulate without significant dyspnea or chest discomfort.  He was extensively counseled regarding a low-salt diet. Possible pneumonia, ruled out-Chest x-ray unrevealing, patient was placed on ceftriaxone and Zithromax, convert to doxycycline, s/p 5 days total, stop doxy today Hypertensive urgency -Possibly related to noncompliance, his blood pressure is much better now  Chronic kidney disease stage III -Baseline creatinine 1.3-1.5 over the last several years, slightly up to 1.6 after Lasix, stable Normocytic anemia -Of chronic disease, hemoglobin stable Troponin elevation -Minimal, likely demand.  No chest pain Type 2 diabetes mellitus -Stable, continue home regimen Prior VT/Fib cardiac arrest with ICD -stable, continue Amiodarone.  No events on telemetry   Discharge Diagnoses:  Principal Problem:   Acute on chronic combined systolic and diastolic CHF (congestive heart failure) (HCC) Active Problems:   DM2 (diabetes mellitus, type 2) (HCC)   Morbid obesity (HCC)   Diabetes mellitus type 2 in obese ([place])   Hypokalemia   Stage 3 chronic kidney disease ([place])   ICD (implantable cardioverter-defibrillator) in place   CAD (coronary artery disease)   Normochromic normocytic anemia   CKD (chronic kidney disease), stage III ([place])  Discharge Instructions  Allergies as of 05/21/2018      Reactions   Contrast Media [iodinated Diagnostic Agents] Shortness Of Breath, Nausea And Vomiting   Lisinopril Cough      Medication List  STOP taking these medications   losartan 100 MG tablet Commonly known as:  COZAAR     TAKE these medications   allopurinol 300 MG tablet Commonly known as:  ZYLOPRIM Take 300 mg by mouth daily.   amiodarone 200 MG tablet Commonly  known as:  PACERONE TAKE 1 TABLET BY MOUTH EVERY DAY   aspirin 81 MG EC tablet Take 1 tablet (81 mg total) by mouth daily.   atorvastatin 40 MG tablet Commonly known as:  LIPITOR Take 1 tablet (40 mg total) by mouth daily at 6 PM.   carvedilol 25 MG tablet Commonly known as:  COREG TAKE 1 TABLET (25 MG TOTAL) BY MOUTH 2 (TWO) TIMES DAILY WITH A MEAL.   [facility] VITAMIN D3 1000 units capsule Generic drug:  Cholecalciferol Take 1,000 Units by mouth daily.   furosemide 40 MG tablet Commonly known as:  LASIX Take 1.5 tablets (60 mg total) by mouth daily. Start taking on:  05/22/2018 What changed:    how much to take  when to take this   KLOR-CON M20 20 MEQ tablet Generic drug:  potassium chloride SA TAKE 1 TABLET BY MOUTH EVERY DAY What changed:  how much to take   magnesium oxide 400 MG tablet Commonly known as:  MAG-OX Take 200 mg by mouth 2 (two) times daily.   metFORMIN 500 MG tablet Commonly known as:  GLUCOPHAGE Take 1 tablet (500 mg total) by mouth daily with breakfast. What changed:  when to take this   sacubitril-valsartan 24-26 MG Commonly known as:  ENTRESTO Take 1 tablet by mouth 2 (two) times daily.   spironolactone 25 MG tablet Commonly known as:  ALDACTONE Take 0.5 tablets (12.5 mg total) by mouth daily. Start taking on:  05/22/2018      Follow-up Information    [facility], Tami Lin, [name]. Go on 05/31/2018.   Specialties:  Physician Assistant, Cardiology, Radiology Why:  @9 :30am for hospital follow up  Contact information: [place] [address] [phone]        [name], MD. Go on 05/29/2018.   Specialty:  Internal Medicine Why:  @2 :30pm Contact information: [address] [place] [address] [phone]           Consultations:  Cardiology   Procedures/Studies:  2D echo  Impressions: - Compared to a prior study in 2017, the LVEF is lower at 30-35% with grade 3 diastolic dysfunction and high LV  filling pressure. AICD wires are noted.   Dg Chest 2 View  Result Date: 05/15/2018 CLINICAL DATA:  Shortness of breath and chest tightness. EXAM: CHEST - 2 VIEW COMPARISON:  10/06/2017 FINDINGS: The patient has new bilateral interstitial pulmonary edema, right greater than left. Slight pulmonary vascular congestion, new. Chronic mild cardiomegaly. AICD in place. No effusions.  No acute bone abnormality. IMPRESSION: New mild interstitial pulmonary edema and pulmonary vascular congestion consistent with congestive heart failure. Electronically Signed   By: Lorriane Shire M.D.   On: 05/15/2018 10:00      Subjective: - no chest pain, shortness of breath, no abdominal pain, nausea or vomiting. Wants to go home   Discharge Exam: Vitals:   05/21/18 1028 05/21/18 1220  BP: (!) 114/37 (!) 124/54  Pulse: (!) 51 (!) 52  Resp: 16 16  Temp:  97.8 F (36.6 C)  SpO2:  100%    General: Pt is alert, awake, not in acute distress Cardiovascular: RRR, S1/S2 +, no rubs, no gallops Respiratory: CTA bilaterally, no wheezing, no  rhonchi Abdominal: Soft, NT, ND, bowel sounds + Extremities: no edema, no cyanosis    The results of significant diagnostics from this hospitalization (including imaging, microbiology, ancillary and laboratory) are listed below for reference.     Microbiology: No results found for this or any previous visit (from the past 240 hour(s)).   Labs: BNP (last 3 results) Recent Labs    05/15/18 0950  BNP 017.5*   Basic Metabolic Panel: Recent Labs  Lab 05/16/18 0153 05/17/18 0525 05/18/18 0400 05/19/18 0602 05/20/18 0621 05/21/18 0438  NA 141 135 135 137 138 139  K 4.4 4.3 4.5 4.1 4.7 4.2  CL 105 103 104 102 102 104  CO2 27 23 22 28 27 28   GLUCOSE 98 102* 89 125* 85 83  BUN 12 21 26* 32* 30* 36*  CREATININE 1.10 1.45* 1.48* 1.68* 1.66* 1.63*  CALCIUM 9.7 9.7 9.4 9.7 9.9 9.9  MG 1.7  --   --   --   --   --    Liver Function Tests: Recent Labs  Lab  05/18/18 0400  AST 27  ALT 23  ALKPHOS 91  BILITOT 0.9  PROT 6.7  ALBUMIN 3.1*   No results for input(s): LIPASE, AMYLASE in the last 168 hours. No results for input(s): AMMONIA in the last 168 hours. CBC: Recent Labs  Lab 05/15/18 0950 05/18/18 0400 05/19/18 0602  WBC 10.7* 10.6* 9.7  HGB 10.7* 9.4* 9.3*  HCT 36.4* 30.6* 31.0*  MCV 90.5 86.0 88.3  PLT 223 170 187   Cardiac Enzymes: Recent Labs  Lab 05/15/18 1849 05/16/18 0153  TROPONINI 0.08* 0.07*   BNP: Invalid input(s): POCBNP CBG: Recent Labs  Lab 05/20/18 1121 05/20/18 1646 05/20/18 2107 05/21/18 0810 05/21/18 1219  GLUCAP 92 98 107* 86 92   D-Dimer No results for input(s): DDIMER in the last 72 hours. Hgb A1c No results for input(s): HGBA1C in the last 72 hours. Lipid Profile No results for input(s): CHOL, HDL, LDLCALC, TRIG, CHOLHDL, LDLDIRECT in the last 72 hours. Thyroid function studies No results for input(s): TSH, T4TOTAL, T3FREE, THYROIDAB in the last 72 hours.  Invalid input(s): FREET3 Anemia work up No results for input(s): VITAMINB12, FOLATE, FERRITIN, TIBC, IRON, RETICCTPCT in the last 72 hours. Urinalysis    Component Value Date/Time   COLORURINE YELLOW 05/15/2018 [place] 05/15/2018 1705   LABSPEC 1.008 05/15/2018 1705   PHURINE 6.0 05/15/2018 1705   GLUCOSEU NEGATIVE 05/15/2018 1705   HGBUR NEGATIVE 05/15/2018 1705   BILIRUBINUR NEGATIVE 05/15/2018 [place] 05/15/2018 1705   PROTEINUR 100 (A) 05/15/2018 1705   NITRITE NEGATIVE 05/15/2018 1705   LEUKOCYTESUR TRACE (A) 05/15/2018 1705   Sepsis Labs Invalid input(s): PROCALCITONIN,  WBC,  LACTICIDVEN   Time coordinating discharge: 45 minutes  SIGNED:  Marzetta Board, MD  [facility] 05/21/2018, 2:22 PM Pager [phone]  If 7PM-7AM, please contact night-coverage [email or web address] Password [email or web address]

## 2018-05-21 NOTE — Progress Notes (Signed)
Progress Note  Patient Name: Dean [name] Date of Encounter: 05/21/2018  Primary Cardiologist: Will Meredith Leeds, MD   Subjective   He feels okay.  He really wants to get out of hospital to make at home.  The lower extremities are swollen he feels like there at baseline.  Inpatient Medications    Scheduled Meds: . allopurinol  300 mg Oral Daily  . amiodarone  200 mg Oral Daily  . aspirin EC  81 mg Oral Daily  . atorvastatin  40 mg Oral q1800  . carvedilol  25 mg Oral BID WC  . enoxaparin (LOVENOX) injection  40 mg Subcutaneous Q24H  . furosemide  40 mg Oral Daily  . magnesium oxide  200 mg Oral BID  . potassium chloride SA  20 mEq Oral Daily  . sacubitril-valsartan  1 tablet Oral BID  . sodium chloride flush  3 mL Intravenous Q12H  . spironolactone  12.5 mg Oral Daily   Continuous Infusions: . sodium chloride     PRN Meds: sodium chloride, acetaminophen, guaiFENesin-dextromethorphan, ipratropium-albuterol, ondansetron (ZOFRAN) IV, sodium chloride flush   Vital Signs    Vitals:   05/21/18 0547 05/21/18 0839 05/21/18 1028 05/21/18 1220  BP: (!) 138/47 (!) 151/42 (!) 114/37 (!) 124/54  Pulse: 61 63 (!) 51 (!) 52  Resp: 18  16 16   Temp: 98.2 F (36.8 C)   97.8 F (36.6 C)  TempSrc: Oral   Oral  SpO2: 94% 100%  100%  Weight: 120.7 kg     Height:        Intake/Output Summary (Last 24 hours) at 05/21/2018 1318 Last data filed at 05/21/2018 1032 Gross per 24 hour  Intake 366 ml  Output 1450 ml  Net -1084 ml   Filed Weights   05/19/18 0409 05/20/18 0556 05/21/18 0547  Weight: 122.8 kg 121.6 kg 120.7 kg    Telemetry    Sinus rhythm with occasional PVCs- Personally Reviewed  ECG    Performed 05/15/2018 demonstrates sinus rhythm, interventricular conduction delay, and extreme right axis deviation.- Personally Reviewed  Physical Exam  Morbid obesity GEN: No acute distress.   Neck: No JVD Cardiac: RRR, no murmurs, rubs, or gallops.  Respiratory: Clear  to auscultation bilaterally. GI: Soft, nontender, non-distended  MS: Bilateral ankle to mid shin pitting edema. Neuro:  Nonfocal  Psych: Normal affect   Labs    Chemistry Recent Labs  Lab 05/18/18 0400 05/19/18 0602 05/20/18 0621 05/21/18 0438  NA 135 137 138 139  K 4.5 4.1 4.7 4.2  CL 104 102 102 104  CO2 22 28 27 28   GLUCOSE 89 125* 85 83  BUN 26* 32* 30* 36*  CREATININE 1.48* 1.68* 1.66* 1.63*  CALCIUM 9.4 9.7 9.9 9.9  PROT 6.7  --   --   --   ALBUMIN 3.1*  --   --   --   AST 27  --   --   --   ALT 23  --   --   --   ALKPHOS 91  --   --   --   BILITOT 0.9  --   --   --   GFRNONAA 47* 40* 41* 42*  GFRAA 54* 47* 47* 48*  ANIONGAP 9 7 9 7      Hematology Recent Labs  Lab 05/15/18 0950 05/18/18 0400 05/19/18 0602  WBC 10.7* 10.6* 9.7  RBC 4.02* 3.56* 3.51*  HGB 10.7* 9.4* 9.3*  HCT 36.4* 30.6* 31.0*  MCV 90.5 86.0 88.3  MCH 26.6 26.4 26.5  MCHC 29.4* 30.7 30.0  RDW 15.5 15.1 15.3  PLT 223 170 187    Cardiac Enzymes Recent Labs  Lab 05/15/18 1849 05/16/18 0153  TROPONINI 0.08* 0.07*    Recent Labs  Lab 05/15/18 1001 05/15/18 1635  TROPIPOC 0.04 0.04     BNP Recent Labs  Lab 05/15/18 0950  BNP 679.6*     DDimer No results for input(s): DDIMER in the last 168 hours.   Radiology    No results found.  Cardiac Studies   2D Doppler echocardiogram May 16, 2018: Study Conclusions   - Left ventricle: The cavity size was normal. Wall thickness was   increased in a pattern of moderate LVH. Systolic function was   moderately to severely reduced. The estimated ejection fraction   was in the range of 30% to 35%. No mural thrombus by Definity   contrast. Diffuse hypokinesis. Doppler parameters are consistent   with restrictive left ventricular relaxation (grade 3 diastolic   dysfunction). The E/e&' ratio is >20, suggesting markedly elevated   LV filling pressure. - Aortic valve: Sclerosis without stenosis. There was trivial    regurgitation. - Mitral valve: Mildly thickened leaflets . There was moderate   regurgitation. - Left atrium: Severely dilated. - Right ventricle: The cavity size was mildly dilated. AICD noted   in right ventricle. - Right atrium: The atrium was mildly dilated. AICD noted in right   atrium. - Tricuspid valve: There was moderate regurgitation. - Pulmonary arteries: PA peak pressure: 52 mm Hg (S). - Inferior vena cava: The vessel was normal in size. The   respirophasic diameter changes were in the normal range (= 50%),   consistent with normal central venous pressure.   Impressions:   - Compared to a prior study in 2017, the LVEF is lower at 30-35%   with grade 3 diastolic dysfunction and high LV filling pressure.   AICD wires are noted.     Patient Profile     67 y.o. male PMH of CAD (CTO of the RCA), CHF, VT/VF s/p ICD, HL, HTN, DM and OSA on Cpap who presented with acute on chronic HF and PNA.    Assessment & Plan    1. Acute on chronic combined systolic and diastolic heart failure: Tolerated the initial dose of Entresto.  Furosemide 40 mg given orally. 2. CAD with known chronic occlusion of RCA -he denies angina. 3. CKD stage III -stable despite diuretic therapy and Entresto. 4. Elevated troponin secondary to demand  The patient has tolerated the initial low dose of Entresto.  I will recommend furosemide 60 mg daily.  He needs a 1 week follow-up to assess kidney function with basic metabolic panel and volume status.  He needs to weigh daily.      For questions or updates, please contact [place] Please consult [email or web address] for contact info under Cardiology/STEMI.      Signed, Sinclair Grooms, MD  05/21/2018, 1:18 PM

## 2018-05-21 NOTE — Progress Notes (Deleted)
Progress Note  Patient Name: Dean [name] Date of Encounter: 05/21/2018  Primary Cardiologist: Will Meredith Leeds, MD   Subjective   No dyspnea or orthopnea. LE edema is improving. No chest pain.   Inpatient Medications    Scheduled Meds: . allopurinol  300 mg Oral Daily  . amiodarone  200 mg Oral Daily  . aspirin EC  81 mg Oral Daily  . atorvastatin  40 mg Oral q1800  . carvedilol  25 mg Oral BID WC  . enoxaparin (LOVENOX) injection  40 mg Subcutaneous Q24H  . furosemide  40 mg Oral Daily  . magnesium oxide  200 mg Oral BID  . potassium chloride SA  20 mEq Oral Daily  . sacubitril-valsartan  1 tablet Oral BID  . sodium chloride flush  3 mL Intravenous Q12H  . spironolactone  12.5 mg Oral Daily   Continuous Infusions: . sodium chloride     PRN Meds: sodium chloride, acetaminophen, guaiFENesin-dextromethorphan, ipratropium-albuterol, ondansetron (ZOFRAN) IV, sodium chloride flush   Vital Signs    Vitals:   05/21/18 0547 05/21/18 0839 05/21/18 1028 05/21/18 1220  BP: (!) 138/47 (!) 151/42 (!) 114/37 (!) 124/54  Pulse: 61 63 (!) 51 (!) 52  Resp: 18  16 16   Temp: 98.2 F (36.8 C)   97.8 F (36.6 C)  TempSrc: Oral   Oral  SpO2: 94% 100%  100%  Weight: 120.7 kg     Height:        Intake/Output Summary (Last 24 hours) at 05/21/2018 1311 Last data filed at 05/21/2018 1032 Gross per 24 hour  Intake 366 ml  Output 1450 ml  Net -1084 ml   Filed Weights   05/19/18 0409 05/20/18 0556 05/21/18 0547  Weight: 122.8 kg 121.6 kg 120.7 kg    Telemetry    SR with conduction dellay- Personally Reviewed  ECG    N/A  Physical Exam   GEN: Obese male in no acute distress.   Neck: No JVD Cardiac: RRR, no murmurs, rubs, or gallops.  Respiratory: Clear to auscultation bilaterally. GI: Soft, nontender, non-distended  MS: 1+ BL LE edema; No deformity. Neuro:  Nonfocal  Psych: Normal affect   Labs    Chemistry Recent Labs  Lab 05/18/18 0400 05/19/18 0602  05/20/18 0621 05/21/18 0438  NA 135 137 138 139  K 4.5 4.1 4.7 4.2  CL 104 102 102 104  CO2 22 28 27 28   GLUCOSE 89 125* 85 83  BUN 26* 32* 30* 36*  CREATININE 1.48* 1.68* 1.66* 1.63*  CALCIUM 9.4 9.7 9.9 9.9  PROT 6.7  --   --   --   ALBUMIN 3.1*  --   --   --   AST 27  --   --   --   ALT 23  --   --   --   ALKPHOS 91  --   --   --   BILITOT 0.9  --   --   --   GFRNONAA 47* 40* 41* 42*  GFRAA 54* 47* 47* 48*  ANIONGAP 9 7 9 7      Hematology Recent Labs  Lab 05/15/18 0950 05/18/18 0400 05/19/18 0602  WBC 10.7* 10.6* 9.7  RBC 4.02* 3.56* 3.51*  HGB 10.7* 9.4* 9.3*  HCT 36.4* 30.6* 31.0*  MCV 90.5 86.0 88.3  MCH 26.6 26.4 26.5  MCHC 29.4* 30.7 30.0  RDW 15.5 15.1 15.3  PLT 223 170 187    Cardiac Enzymes Recent Labs  Lab 05/15/18  1849 05/16/18 0153  TROPONINI 0.08* 0.07*    Recent Labs  Lab 05/15/18 1001 05/15/18 1635  TROPIPOC 0.04 0.04     BNP Recent Labs  Lab 05/15/18 0950  BNP 679.6*     Radiology    No results found.  Cardiac Studies   2D Doppler echocardiogram May 16, 2018: Study Conclusions  - Left ventricle: The cavity size was normal. Wall thickness was increased in a pattern of moderate LVH. Systolic function was moderately to severely reduced. The estimated ejection fraction was in the range of 30% to 35%. No mural thrombus by Definity contrast. Diffuse hypokinesis. Doppler parameters are consistent with restrictive left ventricular relaxation (grade 3 diastolic dysfunction). The E/e&' ratio is >20, suggesting markedly elevated LV filling pressure. - Aortic valve: Sclerosis without stenosis. There was trivial regurgitation. - Mitral valve: Mildly thickened leaflets . There was moderate regurgitation. - Left atrium: Severely dilated. - Right ventricle: The cavity size was mildly dilated. AICD noted in right ventricle. - Right atrium: The atrium was mildly dilated. AICD noted in right atrium. -  Tricuspid valve: There was moderate regurgitation. - Pulmonary arteries: PA peak pressure: 52 mm Hg (S). - Inferior vena cava: The vessel was normal in size. The respirophasic diameter changes were in the normal range (= 50%), consistent with normal central venous pressure.  Impressions:  - Compared to a prior study in 2017, the LVEF is lower at 30-35% with grade 3 diastolic dysfunction and high LV filling pressure. AICD wires are noted.  Patient Profile     67 y.o. male PMH of CAD (CTO of the RCA), CHF, VT/VF s/p ICD, HL, HTN, DM and OSA on Cpap who presented with acute on chronic HF and PNA.  Assessment & Plan    1. Acute on chronic combined CHF - echo showed LVEF of 30-35% with grade 3 DD (LVEF was 35-40% on 08/2016). Net I & O negative 5.4L. Weight down 6 lb. Continue medical therapy with Coreg, Entresto and Spironolactone. Follow renal function closely. Clinically improving.   2. Hx of VT/VF s/p AICD  3. CKD stage III - Baseline creatinine of 1.3-1.5. Today 1.63.  4. Elevated troponin - Felt demand   For questions or updates, please contact [place] Please consult [email or web address] for contact info under Cardiology/STEMI.      Jarrett Soho, PA  05/21/2018, 1:11 PM

## 2018-05-21 NOTE — Discharge Instructions (Signed)
Follow with [name], MD in 5-7 days  Please get a complete blood count and chemistry panel checked by your Primary MD at your next visit, and again as instructed by your Primary MD. Please get your medications reviewed and adjusted by your Primary MD.  Please request your Primary MD to go over all Hospital Tests and Procedure/Radiological results at the follow up, please get all Hospital records sent to your Prim MD by signing hospital release before you go home.  If you had Pneumonia of Lung problems at the Hospital: Please get a 2 view Chest X ray done in 6-8 weeks after hospital discharge or sooner if instructed by your Primary MD.  If you have Congestive Heart Failure: Please call your Cardiologist or Primary MD anytime you have any of the following symptoms:  1) 3 pound weight gain in 24 hours or 5 pounds in 1 week  2) shortness of breath, with or without a dry hacking cough  3) swelling in the hands, feet or stomach  4) if you have to sleep on extra pillows at night in order to breathe  Follow cardiac low salt diet and 1.5 lit/day fluid restriction.  If you have diabetes Accuchecks 4 times/day, Once in AM empty stomach and then before each meal. Log in all results and show them to your primary doctor at your next visit. If any glucose reading is under 80 or above 300 call your primary MD immediately.  If you have Seizure/Convulsions/Epilepsy: Please do not drive, operate heavy machinery, participate in activities at heights or participate in high speed sports until you have seen by Primary MD or a Neurologist and advised to do so again.  If you had Gastrointestinal Bleeding: Please ask your Primary MD to check a complete blood count within one week of discharge or at your next visit. Your endoscopic/colonoscopic biopsies that are pending at the time of discharge, will also need to followed by your Primary MD.  Get Medicines reviewed and adjusted. Please take all your  medications with you for your next visit with your Primary MD  Please request your Primary MD to go over all hospital tests and procedure/radiological results at the follow up, please ask your Primary MD to get all Hospital records sent to his/her office.  If you experience worsening of your admission symptoms, develop shortness of breath, life threatening emergency, suicidal or homicidal thoughts you must seek medical attention immediately by calling 911 or calling your MD immediately  if symptoms less severe.  You must read complete instructions/literature along with all the possible adverse reactions/side effects for all the Medicines you take and that have been prescribed to you. Take any new Medicines after you have completely understood and accpet all the possible adverse reactions/side effects.   Do not drive or operate heavy machinery when taking Pain medications.   Do not take more than prescribed Pain, Sleep and Anxiety Medications  Special Instructions: If you have smoked or chewed Tobacco  in the last 2 yrs please stop smoking, stop any regular Alcohol  and or any Recreational drug use.  Wear Seat belts while driving.  Please note You were cared for by a hospitalist during your hospital stay. If you have any questions about your discharge medications or the care you received while you were in the hospital after you are discharged, you can call the unit and asked to speak with the hospitalist on call if the hospitalist that took care of you is not available. Once  you are discharged, your primary care physician will handle any further medical issues. Please note that NO REFILLS for any discharge medications will be authorized once you are discharged, as it is imperative that you return to your primary care physician (or establish a relationship with a primary care physician if you do not have one) for your aftercare needs so that they can reassess your need for medications and monitor your  lab values.  You can reach the hospitalist office at phone [phone] or fax [phone]   If you do not have a primary care physician, you can call [phone] for a physician referral.  Activity: As tolerated with Full fall precautions use walker/cane & assistance as needed  Diet: low salt  Disposition Home

## 2018-05-21 NOTE — Progress Notes (Signed)
Pt and daughter given AVS and reviewed appts, med changes etc, reinterated on meds due tonight and called into [facility]

## 2018-05-21 NOTE — Progress Notes (Signed)
Pt informed of discharge, IV removed with tip intact, telemetry removed, pt says he will call to arrange pickup and then will review AVS

## 2018-05-30 NOTE — Progress Notes (Signed)
Cardiology Office Note:    Date:  05/31/2018   ID:  Dean [name], DOB 1951/07/08, MRN [id number]  PCP:  [name], MD  Cardiologist:  [name], MD   Referring MD: [name], MD   Chief Complaint  Patient presents with  . Hospitalization Follow-up    CHF    History of Present Illness:    Dean [name] is a 67 y.o. male with a hx of CAD with CTO of the RCA, VT/V. fib status post ICD, chronic systolic heart failure, hyperlipidemia, hypertension, diabetes, and OSA on CPAP.  He is followed with Dr. Elpidio Anis after cardiac arrest requiring CPR and ACLS.  Heart cath showed 100% CTO of the distal RCA with collaterals.  EF was noted to be 35 to 40% with akinesis and mid basal inferior and apical myocardium with grade 2 diastolic dysfunction.  ICD was placed post cath.  He was recently seen in the hospital on 05/16/2018 with shortness of breath.  He was diagnosed with acute on chronic combined systolic and diastolic heart failure.  This was thought to be due to a combination of medication noncompliance and dietary indiscretion.  BNP was greater than 600 and CXR with edema.  He was diuresed with IV Lasix.  He was also diagnosed with community-acquired pneumonia.  He was started on Entresto during his hospitalization.  He was discharged on 05/21/2018.  He returns today for his hospital follow-up.  He is weighing daily and trying to watch his sodium intake.  His weight is at home is 257 pounds.  He is not taking his blood pressure regularly.  He stated that his blood pressure at his PCPs office earlier this week was systolic in the 341D.  Today his pressure is 114/45 but he did not eat breakfast and took all of his medicine on an empty stomach.  He is feeling a little dizzy today.  I encouraged him to go eat breakfast.  It appears that there was an error in his carvedilol prescription from the pharmacy, he received 12.5 milligrams tablets instead of 25 mg tablets.  He has been taking the  equivalent of 25 mg twice daily of carvedilol.  He was discharged on potassium supplements.  Overall, he has no complaints today and is doing well.  He will continue weighing daily and keep a blood pressure log and bring this log to his next appointment.  Past Medical History:  Diagnosis Date  . 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 Arthritis    "maybe in his spine" (09/05/2016)  . Asthma    pt. denies  . Cardiac arrest ([place]) 08/27/2016   REQUIRING CPR, SHOCK & MEDICATIONS  . CHF (congestive heart failure) ([place])   . Coronary artery disease    90% ostRCA stenosis and total occlusion of dRCA with L-->R collaterals 2017  . Dysrhythmia   . Gout   . High cholesterol   . Hypertension   . Ischemic cardiomyopathy   . LBBB (left bundle branch block)   . OSA on CPAP   . Type II diabetes mellitus ([place])     Past Surgical History:  Procedure Laterality Date  . CARDIAC CATHETERIZATION N/A 09/07/2016   Procedure: Left Heart Cath and Coronary Angiography;  Surgeon: Peter M Martinique, MD;  Location: [facility];  Service: Cardiovascular;  Laterality: N/A;  . EP IMPLANTABLE DEVICE N/A 10/10/2016   Procedure: ICD Implant;  Surgeon: Will Meredith Leeds, MD;  Location: [facility];  Service: Cardiovascular;  Laterality:  N/A;  . LUMBAR LAMINECTOMY/DECOMPRESSION MICRODISCECTOMY N/A 01/18/2018   Procedure: L2-3, L3-4, L-4-5 CENTRAL DECOMPRESSION;  Surgeon: [name], MD;  Location: [facility];  Service: Orthopedics;  Laterality: N/A;    Current Medications: Current Meds  Medication Sig  . allopurinol (ZYLOPRIM) 300 MG tablet Take 300 mg by mouth daily.  [name] amiodarone (PACERONE) 200 MG tablet TAKE 1 TABLET BY MOUTH EVERY DAY  . aspirin EC 81 MG EC tablet Take 1 tablet (81 mg total) by mouth daily.  [name] atorvastatin (LIPITOR) 40 MG tablet Take 1 tablet (40 mg total) by mouth daily at 6 PM.  . carvedilol (COREG) 25 MG tablet Take 1 tablet (25 mg total) by mouth 2 (two)  times daily with a meal.  . CVS VITAMIN D3 1000 units capsule Take 1,000 Units by mouth daily.  . furosemide (LASIX) 40 MG tablet Take 1.5 tablets (60 mg total) by mouth daily.  [name] KLOR-CON M20 20 MEQ tablet TAKE 1 TABLET BY MOUTH EVERY DAY (Patient taking differently: Take 20 mEq by mouth daily. )  . magnesium oxide (MAG-OX) 400 MG tablet Take 200 mg by mouth 2 (two) times daily.  . metFORMIN (GLUCOPHAGE) 500 MG tablet Take 1 tablet (500 mg total) by mouth daily with breakfast. (Patient taking differently: Take 500 mg by mouth 2 (two) times daily with a meal. )  . sacubitril-valsartan (ENTRESTO) 24-26 MG Take 1 tablet by mouth 2 (two) times daily.  [name] spironolactone (ALDACTONE) 25 MG tablet Take 0.5 tablets (12.5 mg total) by mouth daily.  . [DISCONTINUED] carvedilol (COREG) 25 MG tablet TAKE 1 TABLET (25 MG TOTAL) BY MOUTH 2 (TWO) TIMES DAILY WITH A MEAL.     Allergies:   Contrast media [iodinated diagnostic agents] and Lisinopril   Social History   Socioeconomic History  . Marital status: Married    Spouse name: Not on file  . Number of children: Not on file  . Years of education: Not on file  . Highest education level: Not on file  Occupational History  . Not on file  Social Needs  . Financial resource strain: Not on file  . Food insecurity:    Worry: Not on file    Inability: Not on file  . Transportation needs:    Medical: Not on file    Non-medical: Not on file  Tobacco Use  . Smoking status: Never Smoker  . Smokeless tobacco: Never Used  Substance and Sexual Activity  . Alcohol use: No  . Drug use: No  . Sexual activity: Not on file  Lifestyle  . Physical activity:    Days per week: Not on file    Minutes per session: Not on file  . Stress: Not on file  Relationships  . Social connections:    Talks on phone: Not on file    Gets together: Not on file    Attends religious service: Not on file    Active member of club or organization: Not on file    Attends  meetings of clubs or organizations: Not on file    Relationship status: Not on file  Other Topics Concern  . Not on file  Social History Narrative  . Not on file     Family History: The patient's family history includes Cancer in his brother; Heart attack in his father; Hypertension in his sister and sister.  ROS:   Please see the history of present illness.    All other systems reviewed and are negative.  EKGs/Labs/Other  Studies Reviewed:    The following studies were reviewed today:  2D Doppler echocardiogram May 16, 2018: Study Conclusions - Left ventricle: The cavity size was normal. Wall thickness was increased in a pattern of moderate LVH. Systolic function was moderately to severely reduced. The estimated ejection fraction was in the range of 30% to 35%. No mural thrombus by Definity contrast. Diffuse hypokinesis. Doppler parameters are consistent with restrictive left ventricular relaxation (grade 3 diastolic dysfunction). The E/e&' ratio is >20, suggesting markedly elevated LV filling pressure. - Aortic valve: Sclerosis without stenosis. There was trivial regurgitation. - Mitral valve: Mildly thickened leaflets . There was moderate regurgitation. - Left atrium: Severely dilated. - Right ventricle: The cavity size was mildly dilated. AICD noted in right ventricle. - Right atrium: The atrium was mildly dilated. AICD noted in right atrium. - Tricuspid valve: There was moderate regurgitation. - Pulmonary arteries: PA peak pressure: 52 mm Hg (S). - Inferior vena cava: The vessel was normal in size. The respirophasic diameter changes were in the normal range (= 50%), consistent with normal central venous pressure.  Impressions:  - Compared to a prior study in 2017, the LVEF is lower at 30-35% with grade 3 diastolic dysfunction and high LV filling pressure. AICD wires are noted.   EKG:  EKG is not ordered today.   Recent  Labs: 07/04/2017: TSH 2.380 05/15/2018: B Natriuretic Peptide 679.6 05/16/2018: Magnesium 1.7 05/18/2018: ALT 23 05/19/2018: Hemoglobin 9.3; Platelets 187 05/21/2018: BUN 36; Creatinine, Ser 1.63; Potassium 4.2; Sodium 139  Recent Lipid Panel    Component Value Date/Time   CHOL 159 09/07/2016 0844   TRIG 146 09/07/2016 0844   HDL 25 (L) 09/07/2016 0844   CHOLHDL 6.4 09/07/2016 0844   VLDL 29 09/07/2016 0844   LDLCALC 105 (H) 09/07/2016 0844    Physical Exam:    VS:  BP (!) 114/45   Pulse 69   Ht 5\' 6"  (1.676 m)   Wt 259 lb (117.5 kg)   BMI 41.80 kg/m     Wt Readings from Last 3 Encounters:  05/31/18 259 lb (117.5 kg)  05/21/18 266 lb (120.7 kg)  03/08/18 272 lb (123.4 kg)     GEN:  Obese AA male in no acute distress HEENT: Normal NECK: No JVD CARDIAC: RRR, no murmurs, rubs, gallops RESPIRATORY:  Clear to auscultation without rales, wheezing or rhonchi  ABDOMEN: Soft, non-tender, non-distended MUSCULOSKELETAL:  trace edema; No deformity  SKIN: Warm and dry NEUROLOGIC:  Alert and oriented x 3 PSYCHIATRIC:  Normal affect   ASSESSMENT:    1. Chronic combined systolic (congestive) and diastolic (congestive) heart failure (HCC)   2. Cardiomyopathy, ischemic   3. Essential hypertension, benign   4. Coronary artery disease involving native coronary artery of native heart without angina pectoris   5. History of cardiac arrest    PLAN:    In order of problems listed above:  Chronic combined systolic (congestive) and diastolic (congestive) heart failure (HCC) - Plan: Basic metabolic panel Cardiomyopathy, ischemic He states that he has been compliant on his sodium restriction and is weighing daily.  His dry weight at home is likely near to 257 pounds.  He is on a good heart failure medication regimen, including Coreg, Lasix, Entresto, and spironolactone.  No medication changes today.  He appears to be euvolemic on exam.  Essential hypertension, benign - Plan: Basic  metabolic panel His pressures seem to be labile - was low here but he took all of his medication on an  empty stomach.  I asked him to keep a blood pressure log 2 hours after his morning medication.  Coronary artery disease involving native coronary artery of native heart without angina pectoris - Plan: Basic metabolic panel Stable. No anginal symptoms.  Out of hospital VT/VF arrest Continue amiodarone.  Medication Adjustments/Labs and Tests Ordered: Current medicines are reviewed at length with the patient today.  Concerns regarding medicines are outlined above.  Orders Placed This Encounter  Procedures  . Basic metabolic panel   Meds ordered this encounter  Medications  . carvedilol (COREG) 25 MG tablet    Sig: Take 1 tablet (25 mg total) by mouth 2 (two) times daily with a meal.    Dispense:  60 tablet    Refill:  7    Signed, Ledora Bottcher, [name]  05/31/2018 9:58 AM    [place]

## 2018-05-31 ENCOUNTER — Ambulatory Visit: Payer: Medicare HMO | Admitting: Physician Assistant

## 2018-05-31 ENCOUNTER — Encounter: Payer: Self-pay | Admitting: Physician Assistant

## 2018-05-31 VITALS — BP 114/45 | HR 69 | Ht 66.0 in | Wt 259.0 lb

## 2018-05-31 DIAGNOSIS — Z8674 Personal history of sudden cardiac arrest: Secondary | ICD-10-CM

## 2018-05-31 DIAGNOSIS — I10 Essential (primary) hypertension: Secondary | ICD-10-CM

## 2018-05-31 DIAGNOSIS — I255 Ischemic cardiomyopathy: Secondary | ICD-10-CM | POA: Diagnosis not present

## 2018-05-31 DIAGNOSIS — I5042 Chronic combined systolic (congestive) and diastolic (congestive) heart failure: Secondary | ICD-10-CM

## 2018-05-31 DIAGNOSIS — I2510 Atherosclerotic heart disease of native coronary artery without angina pectoris: Secondary | ICD-10-CM | POA: Diagnosis not present

## 2018-05-31 LAB — BASIC METABOLIC PANEL
BUN/Creatinine Ratio: 17 (ref 10–24)
BUN: 36 mg/dL — ABNORMAL HIGH (ref 8–27)
CO2: 20 mmol/L (ref 20–29)
Calcium: 10.3 mg/dL — ABNORMAL HIGH (ref 8.6–10.2)
Chloride: 97 mmol/L (ref 96–106)
Creatinine, Ser: 2.08 mg/dL — ABNORMAL HIGH (ref 0.76–1.27)
GFR calc Af Amer: 37 mL/min/{1.73_m2} — ABNORMAL LOW (ref >59)
GFR calc non Af Amer: 32 mL/min/{1.73_m2} — ABNORMAL LOW (ref >59)
Glucose: 99 mg/dL (ref 65–99)
Potassium: 5.1 mmol/L (ref 3.5–5.2)
Sodium: 135 mmol/L (ref 134–144)

## 2018-05-31 MED ORDER — CARVEDILOL 25 MG PO TABS: 25.0000 mg | ORAL_TABLET | Freq: Two times a day (BID) | ORAL | 7 refills | Status: DC

## 2018-05-31 NOTE — Patient Instructions (Signed)
Medication Instructions: Your physician recommends that you continue on your current medications as directed. Please refer to the Current Medication list given to you today.  Labwork: Your physician recommends that you return for lab work today: BMET   Follow-Up: Your physician recommends that you schedule a follow-up appointment in: 2 months with Fabian Sharp, [place].   Any Other Special Instructions are listed below:  Your physician has requested that you regularly monitor and record your blood pressure readings at home. Please use the same machine at the same time of day to check your readings and record them to bring to your follow-up visit.  If you need a refill on your cardiac medications before your next appointment, please call your pharmacy.

## 2018-06-03 ENCOUNTER — Telehealth: Payer: Self-pay | Admitting: Physician Assistant

## 2018-06-03 ENCOUNTER — Other Ambulatory Visit: Payer: Self-pay | Admitting: Physician Assistant

## 2018-06-03 DIAGNOSIS — I5042 Chronic combined systolic (congestive) and diastolic (congestive) heart failure: Secondary | ICD-10-CM

## 2018-06-03 NOTE — Telephone Encounter (Signed)
I called the patient. He stated that his PCP already stopped his potassium supplement last Friday. I also asked him to stop his spironolactone and to come back for BMP Wed/thurs.  He expressed understanding of the plan.

## 2018-06-04 ENCOUNTER — Ambulatory Visit
Admission: RE | Admit: 2018-06-04 | Discharge: 2018-06-04 | Disposition: A | Discharge status: Home / Self Care | Payer: Medicare HMO | Source: Ambulatory Visit | Attending: Internal Medicine | Admitting: Internal Medicine

## 2018-06-04 DIAGNOSIS — M79606 Pain in leg, unspecified: Secondary | ICD-10-CM

## 2018-06-05 ENCOUNTER — Other Ambulatory Visit: Payer: Self-pay

## 2018-06-05 DIAGNOSIS — I5043 Acute on chronic combined systolic (congestive) and diastolic (congestive) heart failure: Secondary | ICD-10-CM

## 2018-06-05 DIAGNOSIS — I5042 Chronic combined systolic (congestive) and diastolic (congestive) heart failure: Secondary | ICD-10-CM

## 2018-06-06 ENCOUNTER — Telehealth: Payer: Self-pay

## 2018-06-06 NOTE — Telephone Encounter (Signed)
Notes on file/sent to scheduling.

## 2018-06-08 LAB — BASIC METABOLIC PANEL
BUN/Creatinine Ratio: 19 (ref 10–24)
BUN: 28 mg/dL — ABNORMAL HIGH (ref 8–27)
CO2: 23 mmol/L (ref 20–29)
Calcium: 10.1 mg/dL (ref 8.6–10.2)
Chloride: 101 mmol/L (ref 96–106)
Creatinine, Ser: 1.51 mg/dL — ABNORMAL HIGH (ref 0.76–1.27)
GFR calc Af Amer: 54 mL/min/{1.73_m2} — ABNORMAL LOW (ref >59)
GFR calc non Af Amer: 47 mL/min/{1.73_m2} — ABNORMAL LOW (ref >59)
Glucose: 100 mg/dL — ABNORMAL HIGH (ref 65–99)
Potassium: 4.8 mmol/L (ref 3.5–5.2)
Sodium: 141 mmol/L (ref 134–144)

## 2018-06-11 ENCOUNTER — Telehealth: Payer: Self-pay

## 2018-06-11 NOTE — Telephone Encounter (Signed)
FAXED NOTES TO [facility] 

## 2018-06-26 NOTE — Progress Notes (Signed)
Cardiology Office Note:    Date:  07/01/2018   ID:  Dean [name], DOB Sep 27, 1950, MRN [id number]  PCP:  [name], MD  Cardiologist:  [name], MD   Referring MD: [name], MD   Chief Complaint  Patient presents with  . Follow-up    pt denied chest pain    History of Present Illness:    Dean [name] is a 67 y.o. male with a hx of HTN, chronic combined systolic and diastolic heart failure, ischenic cardiomyopathy, CAD with CTO of RCA, NSVT, and CKD stage III. VT/V. fib status post ICD, chronic systolic heart failure, hyperlipidemia, hypertension, diabetes, and OSA on CPAP.  He is followed with Dr. Elpidio Anis after cardiac arrest requiring CPR and ACLS.  Heart cath showed 100% CTO of the distal RCA with collaterals.  EF was noted to be 35 to 40% with akinesis and mid basal inferior and apical myocardium with grade 2 diastolic dysfunction.  ICD was placed post cath.  He was recently seen in the hospital on 05/16/2018 with shortness of breath.  He was diagnosed with acute on chronic combined systolic and diastolic heart failure.  This was thought to be due to a combination of medication noncompliance and dietary indiscretion.  BNP was greater than 600 and CXR with edema.  He was diuresed with IV Lasix.  He was also diagnosed with community-acquired pneumonia.  He was started on Entresto during his hospitalization.  He was discharged on 05/21/2018. I saw him in follow up. His dry weight is thought to be 257 lbs. He will return with a BP log as his BP seemed labile - has been in the 140s at other providers' offices, but was 114/45 in our office after taking all meds on an empty stomach. He was instructed to bring a blood pressure log to his follow up appt. BMP at that visit with elevated K of 5.1. I asked him to stop his spironolactone. Repeat labs with K 4.8.   He returns today for follow up. His weight is 264 lbs. He is taking 60 mg lasix daily. He has not been taking  spironolactone. He takes 25 mg coreg BID, 200 mg of amiodarone daily, and 24-26 mg of Entresto twice daily.  He is up in weight today and complains of lower extremity swelling.  He apparently ran out of his [name] a few days prior.  His main complaint today is a nagging dry cough that he has had since discharge in August.  He states that this cough is not like his lisinopril cough. Upon chart review, I do not see PFTs recently in the setting of amiodarone. He denies chest pain, palpitations, SOB, DOE, orthopnea, dizziness, and syncope.    Past Medical History:  Diagnosis Date  . 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 Arthritis    "maybe in his spine" (09/05/2016)  . Asthma    pt. denies  . Cardiac arrest ([place]) 08/27/2016   REQUIRING CPR, SHOCK & MEDICATIONS  . CHF (congestive heart failure) ([place])   . Coronary artery disease    90% ostRCA stenosis and total occlusion of dRCA with L-->R collaterals 2017  . Dysrhythmia   . Gout   . High cholesterol   . Hypertension   . Ischemic cardiomyopathy   . LBBB (left bundle branch block)   . OSA on CPAP   . Type II diabetes mellitus ([place])     Past Surgical History:  Procedure Laterality Date  . CARDIAC CATHETERIZATION N/A 09/07/2016  Procedure: Left Heart Cath and Coronary Angiography;  Surgeon: Peter M Martinique, MD;  Location: [facility];  Service: Cardiovascular;  Laterality: N/A;  . EP IMPLANTABLE DEVICE N/A 10/10/2016   Procedure: ICD Implant;  Surgeon: Will Meredith Leeds, MD;  Location: [facility];  Service: Cardiovascular;  Laterality: N/A;  . LUMBAR LAMINECTOMY/DECOMPRESSION MICRODISCECTOMY N/A 01/18/2018   Procedure: L2-3, L3-4, L-4-5 CENTRAL DECOMPRESSION;  Surgeon: [name], MD;  Location: [facility];  Service: Orthopedics;  Laterality: N/A;    Current Medications: Current Meds  Medication Sig  . allopurinol (ZYLOPRIM) 300 MG tablet Take 300 mg by mouth daily.  [name] amiodarone (PACERONE) 200  MG tablet TAKE 1 TABLET BY MOUTH EVERY DAY  . aspirin EC 81 MG EC tablet Take 1 tablet (81 mg total) by mouth daily.  [name] atorvastatin (LIPITOR) 40 MG tablet Take 1 tablet (40 mg total) by mouth daily at 6 PM.  . carvedilol (COREG) 25 MG tablet Take 1 tablet (25 mg total) by mouth 2 (two) times daily with a meal.  . CVS VITAMIN D3 1000 units capsule Take 1,000 Units by mouth daily.  . furosemide (LASIX) 40 MG tablet Take 1.5 tablets (60 mg total) by mouth daily.  . magnesium oxide (MAG-OX) 400 MG tablet Take 200 mg by mouth 2 (two) times daily.  . [DISCONTINUED] sacubitril-valsartan (ENTRESTO) 24-26 MG Take 1 tablet by mouth 2 (two) times daily.     Allergies:   Contrast media [iodinated diagnostic agents] and Lisinopril   Social History   Socioeconomic History  . Marital status: Married    Spouse name: Not on file  . Number of children: Not on file  . Years of education: Not on file  . Highest education level: Not on file  Occupational History  . Not on file  Social Needs  . Financial resource strain: Not on file  . Food insecurity:    Worry: Not on file    Inability: Not on file  . Transportation needs:    Medical: Not on file    Non-medical: Not on file  Tobacco Use  . Smoking status: Never Smoker  . Smokeless tobacco: Never Used  Substance and Sexual Activity  . Alcohol use: No  . Drug use: No  . Sexual activity: Not on file  Lifestyle  . Physical activity:    Days per week: Not on file    Minutes per session: Not on file  . Stress: Not on file  Relationships  . Social connections:    Talks on phone: Not on file    Gets together: Not on file    Attends religious service: Not on file    Active member of club or organization: Not on file    Attends meetings of clubs or organizations: Not on file    Relationship status: Not on file  Other Topics Concern  . Not on file  Social History Narrative  . Not on file     Family History: The patient's family history  includes Cancer in his brother; Heart attack in his father; Hypertension in his sister and sister.  ROS:   Please see the history of present illness.     All other systems reviewed and are negative.  EKGs/Labs/Other Studies Reviewed:    The following studies were reviewed today:  Echo 05/16/18: Study Conclusions - Left ventricle: The cavity size was normal. Wall thickness was   increased in a pattern of moderate LVH. Systolic function was  moderately to severely reduced. The estimated ejection fraction   was in the range of 30% to 35%. No mural thrombus by Definity   contrast. Diffuse hypokinesis. Doppler parameters are consistent   with restrictive left ventricular relaxation (grade 3 diastolic   dysfunction). The E/e&' ratio is >20, suggesting markedly elevated   LV filling pressure. - Aortic valve: Sclerosis without stenosis. There was trivial   regurgitation. - Mitral valve: Mildly thickened leaflets . There was moderate   regurgitation. - Left atrium: Severely dilated. - Right ventricle: The cavity size was mildly dilated. AICD noted   in right ventricle. - Right atrium: The atrium was mildly dilated. AICD noted in right   atrium. - Tricuspid valve: There was moderate regurgitation. - Pulmonary arteries: PA peak pressure: 52 mm Hg (S). - Inferior vena cava: The vessel was normal in size. The   respirophasic diameter changes were in the normal range (= 50%),   consistent with normal central venous pressure.  Impressions: - Compared to a prior study in 2017, the LVEF is lower at 30-35%   with grade 3 diastolic dysfunction and high LV filling pressure.   AICD wires are noted.   EKG:  EKG is not ordered today.    Recent Labs: 07/04/2017: TSH 2.380 05/15/2018: B Natriuretic Peptide 679.6 05/16/2018: Magnesium 1.7 05/18/2018: ALT 23 05/19/2018: Hemoglobin 9.3; Platelets 187 06/07/2018: BUN 28; Creatinine, Ser 1.51; Potassium 4.8; Sodium 141  Recent Lipid Panel      Component Value Date/Time   CHOL 159 09/07/2016 0844   TRIG 146 09/07/2016 0844   HDL 25 (L) 09/07/2016 0844   CHOLHDL 6.4 09/07/2016 0844   VLDL 29 09/07/2016 0844   LDLCALC 105 (H) 09/07/2016 0844    Physical Exam:    VS:  BP (!) 142/56   Pulse 75   Ht 5\' 6"  (1.676 m)   Wt 264 lb 9.6 oz (120 kg)   SpO2 99%   BMI 42.71 kg/m     Wt Readings from Last 3 Encounters:  07/01/18 264 lb 9.6 oz (120 kg)  05/31/18 259 lb (117.5 kg)  05/21/18 266 lb (120.7 kg)     GEN: Well nourished, well developed in no acute distress HEENT: Normal NECK: No JVD; No carotid bruits CARDIAC: RRR, no murmurs, rubs, gallops RESPIRATORY:  Clear to auscultation without rales, wheezing or rhonchi  ABDOMEN: Soft, non-tender, non-distended MUSCULOSKELETAL:  Mild B LE edema; No deformity  SKIN: Warm and dry NEUROLOGIC:  Alert and oriented x 3 PSYCHIATRIC:  Normal affect   ASSESSMENT:    1. Chronic combined systolic and diastolic congestive heart failure ([place])   2. Cardiopulmonary arrest ([place])   3. On amiodarone therapy   4. Uncomplicated asthma, unspecified asthma severity, unspecified whether persistent   5. Cough   6. Essential hypertension, benign   7. Coronary artery disease involving native coronary artery of native heart without angina pectoris    PLAN:    In order of problems listed above:  Chronic combined systolic and diastolic congestive heart failure ([place]) - Plan: Basic metabolic panel Patient's weight is up approximately 7 pounds today and he does complain of lower extremity swelling.  He does not have JVD and lung sounds are normal on exam.  He did run out of his Entresto several days prior.  He is not weighing at home or taking his blood pressure at home.  I will increase Entresto to 49-51 mg twice daily.  I will keep his Lasix at 60 mg daily for  now.  We will obtain repeat BMP next week.   Cardiopulmonary arrest ([place]) On amiodarone therapy - Plan: Pulmonary Function  Test Uncomplicated asthma, unspecified asthma severity, unspecified whether persistent Cough Patient's main complaint today is persistent dry cough.  He states that this is not like his lisinopril associated cough.  I will order Tessalon Perles and Protonix for now.  I will also obtain PFTs since he is on amiodarone. If he continues to have a cough, will consider CXR at next visit. If cough worsens with increased dose of entresto, may not be able to tolerate entresto.    Essential hypertension, benign Pressure elevated, not taking pressures at home. Sent him home with a log for pressure and weights.   Coronary artery disease involving native coronary artery of native heart without angina pectoris Stable. No chest pain.  Follow up in 4 weeks.    Medication Adjustments/Labs and Tests Ordered: Current medicines are reviewed at length with the patient today.  Concerns regarding medicines are outlined above.  Orders Placed This Encounter  Procedures  . Basic metabolic panel  . Pulmonary Function Test   Meds ordered this encounter  Medications  . sacubitril-valsartan (ENTRESTO) 49-51 MG    Sig: Take 1 tablet by mouth 2 (two) times daily.    Dispense:  60 tablet    Refill:  5    Please discontinue previous dose. Medication has been increased at today's office visit.  [name] benzonatate (TESSALON) 100 MG capsule    Sig: Take 1 capsule (100 mg total) by mouth 2 (two) times daily as needed for cough.    Dispense:  60 capsule    Refill:  0  . pantoprazole (PROTONIX) 40 MG tablet    Sig: Take 1 tablet (40 mg total) by mouth daily.    Dispense:  30 tablet    Refill:  5    Signed, Ledora Bottcher, [name]  07/01/2018 3:46 PM    [facility]

## 2018-07-01 ENCOUNTER — Ambulatory Visit: Payer: Medicare HMO | Admitting: Physician Assistant

## 2018-07-01 ENCOUNTER — Encounter: Payer: Self-pay | Admitting: Physician Assistant

## 2018-07-01 VITALS — BP 142/56 | HR 75 | Ht 66.0 in | Wt 264.6 lb

## 2018-07-01 DIAGNOSIS — Z79899 Other long term (current) drug therapy: Secondary | ICD-10-CM

## 2018-07-01 DIAGNOSIS — R059 Cough, unspecified: Secondary | ICD-10-CM

## 2018-07-01 DIAGNOSIS — R05 Cough: Secondary | ICD-10-CM

## 2018-07-01 DIAGNOSIS — J45909 Unspecified asthma, uncomplicated: Secondary | ICD-10-CM | POA: Diagnosis not present

## 2018-07-01 DIAGNOSIS — I5042 Chronic combined systolic (congestive) and diastolic (congestive) heart failure: Secondary | ICD-10-CM | POA: Diagnosis not present

## 2018-07-01 DIAGNOSIS — I2510 Atherosclerotic heart disease of native coronary artery without angina pectoris: Secondary | ICD-10-CM

## 2018-07-01 DIAGNOSIS — I469 Cardiac arrest, cause unspecified: Secondary | ICD-10-CM

## 2018-07-01 DIAGNOSIS — I10 Essential (primary) hypertension: Secondary | ICD-10-CM

## 2018-07-01 MED ORDER — PANTOPRAZOLE SODIUM 40 MG PO TBEC: 40.0000 mg | DELAYED_RELEASE_TABLET | Freq: Every day | ORAL | 5 refills | Status: DC

## 2018-07-01 MED ORDER — BENZONATATE 100 MG PO CAPS: 100.0000 mg | ORAL_CAPSULE | Freq: Two times a day (BID) | ORAL | 0 refills | Status: DC | PRN

## 2018-07-01 MED ORDER — SACUBITRIL-VALSARTAN 49-51 MG PO TABS: 1.0000 | ORAL_TABLET | Freq: Two times a day (BID) | ORAL | 5 refills | Status: DC

## 2018-07-01 NOTE — Patient Instructions (Addendum)
Medication Instructions:  Doreene Adas, PA has recommended making the following medication changes: 1. INCREASE Entresto to 49-51 mg twice daily 2. START Pantoprazole 40 mg daily 3. TAKE Tessalon perles twice daily as needed for cough for 30 days  If you need a refill on your cardiac medications before your next appointment, please call your pharmacy.   Lab work: Your physician recommends that you return for lab work next Wednesday.  If you have labs (blood work) drawn today and your tests are completely normal, you will receive your results only by: [name] MyChart Message (if you have MyChart) OR . A paper copy in the mail If you have any lab test that is abnormal or we need to change your treatment, we will call you to review the results.  Testing/Procedures: 1. Pulmonary Function Test - Your physician has recommended that you have a pulmonary function test. Pulmonary Function Tests are a group of tests that measure how well air moves in and out of your lungs.  >> This has been ordered to be completed at [place]: At [facility], you and your health needs are our priority.  As part of our continuing mission to provide you with exceptional heart care, we have created designated Provider Care Teams.  These Care Teams include your primary Cardiologist (physician) and Advanced Practice Providers (APPs -  Physician Assistants and Nurse Practitioners) who all work together to provide you with the care you need, when you need it. You will need a follow up appointment in 4 weeks with Doreene Adas, PA and 3 months with Dr Curt Bears.   Low-Sodium Eating Plan Sodium, which is an element that makes up salt, helps you maintain a healthy balance of fluids in your body. Too much sodium can increase your blood pressure and cause fluid and waste to be held in your body. Your health care provider or dietitian may recommend following this plan if you have high blood pressure (hypertension),  kidney disease, liver disease, or heart failure. Eating less sodium can help lower your blood pressure, reduce swelling, and protect your heart, liver, and kidneys. What are tips for following this plan? General guidelines  Most people on this plan should limit their sodium intake to 1,500-2,000 mg (milligrams) of sodium each day. Reading food labels  The Nutrition Facts label lists the amount of sodium in one serving of the food. If you eat more than one serving, you must multiply the listed amount of sodium by the number of servings.  Choose foods with less than 140 mg of sodium per serving.  Avoid foods with 300 mg of sodium or more per serving. Shopping  Look for lower-sodium products, often labeled as "low-sodium" or "no salt added."  Always check the sodium content even if foods are labeled as "unsalted" or "no salt added".  Buy fresh foods. ? Avoid canned foods and premade or frozen meals. ? Avoid canned, cured, or processed meats  Buy breads that have less than 80 mg of sodium per slice. Cooking  Eat more home-cooked food and less restaurant, buffet, and fast food.  Avoid adding salt when cooking. Use salt-free seasonings or herbs instead of table salt or sea salt. Check with your health care provider or pharmacist before using salt substitutes.  Cook with plant-based oils, such as canola, sunflower, or olive oil. Meal planning  When eating at a restaurant, ask that your food be prepared with less salt or no salt, if possible.  Avoid foods that  contain MSG (monosodium glutamate). MSG is sometimes added to [place] food, bouillon, and some canned foods. What foods are recommended? The items listed may not be a complete list. Talk with your dietitian about what dietary choices are best for you. Grains Low-sodium cereals, including oats, puffed wheat and rice, and shredded wheat. Low-sodium crackers. Unsalted rice. Unsalted pasta. Low-sodium bread. Whole-grain breads and  whole-grain pasta. Vegetables Fresh or frozen vegetables. "No salt added" canned vegetables. "No salt added" tomato sauce and paste. Low-sodium or reduced-sodium tomato and vegetable juice. Fruits Fresh, frozen, or canned fruit. Fruit juice. Meats and other protein foods Fresh or frozen (no salt added) meat, poultry, seafood, and fish. Low-sodium canned tuna and salmon. Unsalted nuts. Dried peas, beans, and lentils without added salt. Unsalted canned beans. Eggs. Unsalted nut butters. Dairy Milk. Soy milk. Cheese that is naturally low in sodium, such as ricotta cheese, fresh mozzarella, or Swiss cheese Low-sodium or reduced-sodium cheese. Cream cheese. Yogurt. Fats and oils Unsalted butter. Unsalted margarine with no trans fat. Vegetable oils such as canola or olive oils. Seasonings and other foods Fresh and dried herbs and spices. Salt-free seasonings. Low-sodium mustard and ketchup. Sodium-free salad dressing. Sodium-free light mayonnaise. Fresh or refrigerated horseradish. Lemon juice. Vinegar. Homemade, reduced-sodium, or low-sodium soups. Unsalted popcorn and pretzels. Low-salt or salt-free chips. What foods are not recommended? The items listed may not be a complete list. Talk with your dietitian about what dietary choices are best for you. Grains Instant hot cereals. Bread stuffing, pancake, and biscuit mixes. Croutons. Seasoned rice or pasta mixes. Noodle soup cups. Boxed or frozen macaroni and cheese. Regular salted crackers. Self-rising flour. Vegetables Sauerkraut, pickled vegetables, and relishes. Olives. [place] fries. Onion rings. Regular canned vegetables (not low-sodium or reduced-sodium). Regular canned tomato sauce and paste (not low-sodium or reduced-sodium). Regular tomato and vegetable juice (not low-sodium or reduced-sodium). Frozen vegetables in sauces. Meats and other protein foods Meat or fish that is salted, canned, smoked, spiced, or pickled. Bacon, ham, sausage,  hotdogs, corned beef, chipped beef, packaged lunch meats, salt pork, jerky, pickled herring, anchovies, regular canned tuna, sardines, salted nuts. Dairy Processed cheese and cheese spreads. Cheese curds. Blue cheese. Feta cheese. String cheese. Regular cottage cheese. Buttermilk. Canned milk. Fats and oils Salted butter. Regular margarine. Ghee. Bacon fat. Seasonings and other foods Onion salt, garlic salt, seasoned salt, table salt, and sea salt. Canned and packaged gravies. Worcestershire sauce. Tartar sauce. Barbecue sauce. Teriyaki sauce. Soy sauce, including reduced-sodium. Steak sauce. Fish sauce. Oyster sauce. Cocktail sauce. Horseradish that you find on the shelf. Regular ketchup and mustard. Meat flavorings and tenderizers. Bouillon cubes. Hot sauce and Tabasco sauce. Premade or packaged marinades. Premade or packaged taco seasonings. Relishes. Regular salad dressings. Salsa. Potato and tortilla chips. Corn chips and puffs. Salted popcorn and pretzels. Canned or dried soups. Pizza. Frozen entrees and pot pies. Summary  Eating less sodium can help lower your blood pressure, reduce swelling, and protect your heart, liver, and kidneys.  Most people on this plan should limit their sodium intake to 1,500-2,000 mg (milligrams) of sodium each day.  Canned, boxed, and frozen foods are high in sodium. Restaurant foods, fast foods, and pizza are also very high in sodium. You also get sodium by adding salt to food.  Try to cook at home, eat more fresh fruits and vegetables, and eat less fast food, canned, processed, or prepared foods. This information is not intended to replace advice given to you by your health care provider. Make sure you  discuss any questions you have with your health care provider. Document Released: 03/03/2002 Document Revised: 09/04/2016 Document Reviewed: 09/04/2016 Elsevier Interactive Patient Education  [employer].

## 2018-07-03 ENCOUNTER — Ambulatory Visit (INDEPENDENT_AMBULATORY_CARE_PROVIDER_SITE_OTHER): Payer: Medicare HMO | Admitting: *Deleted

## 2018-07-03 DIAGNOSIS — I255 Ischemic cardiomyopathy: Secondary | ICD-10-CM | POA: Diagnosis not present

## 2018-07-03 NOTE — Progress Notes (Signed)
Remote ICD transmission.   

## 2018-07-11 ENCOUNTER — Other Ambulatory Visit: Payer: Self-pay | Admitting: Cardiology

## 2018-07-11 ENCOUNTER — Ambulatory Visit (HOSPITAL_COMMUNITY)
Admission: RE | Admit: 2018-07-11 | Discharge: 2018-07-11 | Disposition: A | Discharge status: Home / Self Care | Payer: Medicare HMO | Source: Ambulatory Visit | Attending: Physician Assistant | Admitting: Physician Assistant

## 2018-07-11 DIAGNOSIS — Z79899 Other long term (current) drug therapy: Secondary | ICD-10-CM | POA: Insufficient documentation

## 2018-07-11 LAB — BASIC METABOLIC PANEL
BUN/Creatinine Ratio: 14 (ref 10–24)
BUN: 16 mg/dL (ref 8–27)
CO2: 22 mmol/L (ref 20–29)
Calcium: 9.4 mg/dL (ref 8.6–10.2)
Chloride: 101 mmol/L (ref 96–106)
Creatinine, Ser: 1.12 mg/dL (ref 0.76–1.27)
GFR calc Af Amer: 78 mL/min/{1.73_m2} (ref >59)
GFR calc non Af Amer: 67 mL/min/{1.73_m2} (ref >59)
Glucose: 95 mg/dL (ref 65–99)
Potassium: 3.7 mmol/L (ref 3.5–5.2)
Sodium: 140 mmol/L (ref 134–144)

## 2018-07-11 LAB — PULMONARY FUNCTION TEST
DL/VA % pred: 111 %
DL/VA: 4.84 ml/min/mmHg/L
DLCO unc % pred: 52 %
DLCO unc: 14.15 ml/min/mmHg
FEF 25-75 Post: 1.41 L/sec
FEF 25-75 Pre: 0.86 L/sec
FEF2575-%Change-Post: 63 %
FEF2575-%Pred-Post: 63 %
FEF2575-%Pred-Pre: 39 %
FEV1-%Change-Post: 11 %
FEV1-%Pred-Post: 52 %
FEV1-%Pred-Pre: 47 %
FEV1-Post: 1.31 L
FEV1-Pre: 1.18 L
FEV1FVC-%Change-Post: 3 %
FEV1FVC-%Pred-Pre: 98 %
FEV6-%Change-Post: 7 %
FEV6-%Pred-Post: 53 %
FEV6-%Pred-Pre: 49 %
FEV6-Post: 1.68 L
FEV6-Pre: 1.56 L
FEV6FVC-%Pred-Post: 105 %
FEV6FVC-%Pred-Pre: 105 %
FVC-%Change-Post: 7 %
FVC-%Pred-Post: 51 %
FVC-%Pred-Pre: 47 %
FVC-Post: 1.68 L
FVC-Pre: 1.56 L
Post FEV1/FVC ratio: 78 %
Post FEV6/FVC ratio: 100 %
Pre FEV1/FVC ratio: 75 %
Pre FEV6/FVC Ratio: 100 %
RV % pred: 124 %
RV: 2.74 L
TLC % pred: 72 %
TLC: 4.5 L

## 2018-07-11 MED ORDER — ALBUTEROL SULFATE (2.5 MG/3ML) 0.083% IN NEBU
2.5000 mg | INHALATION_SOLUTION | Freq: Once | RESPIRATORY_TRACT | Status: AC
  Administered 2018-07-11: 2.5 mg via RESPIRATORY_TRACT

## 2018-07-16 ENCOUNTER — Telehealth: Payer: Self-pay

## 2018-07-16 DIAGNOSIS — R942 Abnormal results of pulmonary function studies: Secondary | ICD-10-CM

## 2018-07-16 NOTE — Telephone Encounter (Signed)
-----   Message from Ledora Bottcher, [name] sent at 07/16/2018  3:10 PM EDT ----- Yes, please refer to pulmonology and send results of PFTs.

## 2018-07-16 NOTE — Telephone Encounter (Signed)
REFERRAL MADE; PATIENT NOTIFIED AND VOICED UNDERSTANDING.

## 2018-07-22 ENCOUNTER — Telehealth: Payer: Self-pay | Admitting: Cardiology

## 2018-07-22 ENCOUNTER — Ambulatory Visit: Payer: Medicare HMO | Admitting: Adult Health

## 2018-07-22 ENCOUNTER — Encounter: Payer: Self-pay | Admitting: Adult Health

## 2018-07-22 VITALS — BP 160/68 | HR 71 | Ht 66.0 in | Wt 267.4 lb

## 2018-07-22 DIAGNOSIS — I469 Cardiac arrest, cause unspecified: Secondary | ICD-10-CM

## 2018-07-22 DIAGNOSIS — Z79899 Other long term (current) drug therapy: Secondary | ICD-10-CM

## 2018-07-22 DIAGNOSIS — I5043 Acute on chronic combined systolic (congestive) and diastolic (congestive) heart failure: Secondary | ICD-10-CM

## 2018-07-22 DIAGNOSIS — I2510 Atherosclerotic heart disease of native coronary artery without angina pectoris: Secondary | ICD-10-CM | POA: Diagnosis not present

## 2018-07-22 DIAGNOSIS — I10 Essential (primary) hypertension: Secondary | ICD-10-CM

## 2018-07-22 MED ORDER — METOLAZONE 2.5 MG PO TABS: 2.5000 mg | ORAL_TABLET | Freq: Every day | ORAL | 0 refills | Status: DC

## 2018-07-22 MED ORDER — FUROSEMIDE 40 MG PO TABS: 60.0000 mg | ORAL_TABLET | Freq: Two times a day (BID) | ORAL | 0 refills | Status: DC

## 2018-07-22 NOTE — Progress Notes (Signed)
Cardiology Office Note   Date:  07/22/2018   ID:  Dean Bones Sr., DOB [birth year], MRN [id number]  PCP:  [name], MD  Cardiologist:  Dr. Curt Bears Chief Complaint  Patient presents with  . Shortness of Breath  . Leg Swelling     History of Present Illness: Dean Gudiel Sr. is a 67 y.o. male who presents for ongoing assessment and management of HTN, chronic combined systolic and diastolic heart failure, ischenic cardiomyopathy, EF of 35%-40% with akinesis and mid basal inferior and apical myocardium with Grade 2 diastolic dysfunction, CAD with CTO of RCA, NSVT, and CKD stage III. VT/V. fib status post ICD, chronic systolic heart failure,hyperlipidemia, hypertension, diabetes, and OSA on CPAP.   He was last seen in the office by Dean Sharp, PA and had complaints of LEE, had run out of Entresto, and was complaining of a nagging cough,  He had gained 7 lbs. Entresto was increased to 49/51 mg BID, he was continued on lasix 60 mg daily. He was started on [employer] and PPI for cough. If cough worsened with increased dose of Entresto would consider alternative. PFT's were ordered.   The test revealed severe airway obstruction with early parenchymal process. I trial of bronchodilators was recommended. Dean [name] He was referred to pulmonary for treatment recommendations.   He comes today having gained 3 more pounds. He has not been compliant with his low sodium diet, eating pizza and fast food. He has not taken his medication yet today. He has been weighing himself and recording. BP's have been higher.  He wants to go back in the hospital.   Past Medical History:  Diagnosis Date  . 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 Arthritis    "maybe in his spine" (09/05/2016)  . Asthma    pt. denies  . Cardiac arrest ([place]) 08/27/2016   REQUIRING CPR, SHOCK & MEDICATIONS  . CHF (congestive heart failure) ([place])   . Coronary artery disease    90% ostRCA stenosis and  total occlusion of dRCA with L-->R collaterals 2017  . Dysrhythmia   . Gout   . High cholesterol   . Hypertension   . Ischemic cardiomyopathy   . LBBB (left bundle branch block)   . OSA on CPAP   . Type II diabetes mellitus ([place])     Past Surgical History:  Procedure Laterality Date  . CARDIAC CATHETERIZATION N/A 09/07/2016   Procedure: Left Heart Cath and Coronary Angiography;  Surgeon: Dean M Martinique, MD;  Location: [place] [facility];  Service: Cardiovascular;  Laterality: N/A;  . EP IMPLANTABLE DEVICE N/A 10/10/2016   Procedure: ICD Implant;  Surgeon: Dean Meredith Leeds, MD;  Location: [place] [facility];  Service: Cardiovascular;  Laterality: N/A;  . LUMBAR LAMINECTOMY/DECOMPRESSION MICRODISCECTOMY N/A 01/18/2018   Procedure: L2-3, L3-4, L-4-5 CENTRAL DECOMPRESSION;  Surgeon: Dean Killings, MD;  Location: [place];  Service: Orthopedics;  Laterality: N/A;     Current Outpatient Medications  Medication Sig Dispense Refill  . allopurinol (ZYLOPRIM) 300 MG tablet Take 300 mg by mouth daily.    Dean [name] amiodarone (PACERONE) 200 MG tablet TAKE 1 TABLET BY MOUTH EVERY DAY 30 tablet 7  . aspirin EC 81 MG EC tablet Take 1 tablet (81 mg total) by mouth daily.    Dean [name] atorvastatin (LIPITOR) 40 MG tablet Take 1 tablet (40 mg total) by mouth daily at 6 PM. 30 tablet 5  . benzonatate (TESSALON) 100 MG capsule Take 1 capsule (100 mg total) by mouth  2 (two) times daily as needed for cough. 60 capsule 0  . carvedilol (COREG) 25 MG tablet Take 1 tablet (25 mg total) by mouth 2 (two) times daily with a meal. 60 tablet 7  . CVS VITAMIN D3 1000 units capsule Take 1,000 Units by mouth daily.  5  . furosemide (LASIX) 40 MG tablet Take 1.5 tablets (60 mg total) by mouth 2 (two) times daily. 45 tablet 0  . magnesium oxide (MAG-OX) 400 MG tablet Take 200 mg by mouth 2 (two) times daily.    . pantoprazole (PROTONIX) 40 MG tablet Take 1 tablet (40 mg total) by mouth daily. 30 tablet 5  . sacubitril-valsartan  (ENTRESTO) 49-51 MG Take 1 tablet by mouth 2 (two) times daily. 60 tablet 5  . metolazone (ZAROXOLYN) 2.5 MG tablet Take 1 tablet (2.5 mg total) by mouth daily. TAKE TODAY AND Thursday CALL WED 5 tablet 0   No current facility-administered medications for this visit.     Allergies:   Contrast media [iodinated diagnostic agents] and Lisinopril    Social History:  The patient  reports that he has never smoked. He has never used smokeless tobacco. He reports that he does not drink alcohol or use drugs.   Family History:  The patient's family history includes Cancer in his brother; Heart attack in his father; Hypertension in his sister and sister.    ROS: All other systems are reviewed and negative. Unless otherwise mentioned in H&P    PHYSICAL EXAM: VS:  BP (!) 160/68   Pulse 71   Ht 5\' 6"  (1.676 m)   Wt 267 lb 6.4 oz (121.3 kg)   BMI 43.16 kg/m  , BMI Body mass index is 43.16 kg/m. GEN: Well nourished, well developed, in no acute distress, Strong malodorous, urine and body odor. Obese.   HEENT: normal Neck: no JVD, carotid bruits, or masses Cardiac: RRR; no murmurs, rubs, or gallops, 2+ pretibial edema  Respiratory:  Some crackles in the bases.  GI: soft, nontender, nondistended, + BS MS: no deformity or atrophy Skin: warm and dry, no rash Neuro:  Strength and sensation are intact Psych: euthymic mood, full affect   EKG: Not completed this office visit.   Recent Labs: 05/15/2018: B Natriuretic Peptide 679.6 05/16/2018: Magnesium 1.7 05/18/2018: ALT 23 05/19/2018: Hemoglobin 9.3; Platelets 187 07/10/2018: BUN 16; Creatinine, Ser 1.12; Potassium 3.7; Sodium 140    Lipid Panel    Component Value Date/Time   CHOL 159 09/07/2016 0844   TRIG 146 09/07/2016 0844   HDL 25 (L) 09/07/2016 0844   CHOLHDL 6.4 09/07/2016 0844   VLDL 29 09/07/2016 0844   LDLCALC 105 (H) 09/07/2016 0844      Wt Readings from Last 3 Encounters:  07/22/18 267 lb 6.4 oz (121.3 kg)  07/01/18 264  lb 9.6 oz (120 kg)  05/31/18 259 lb (117.5 kg)      Other studies Reviewed: Echo 05/16/18: Study Conclusions - Left ventricle: The cavity size was normal. Wall thickness was increased in a pattern of moderate LVH. Systolic function was moderately to severely reduced. The estimated ejection fraction was in the range of 30% to 35%. No mural thrombus by Definity contrast. Diffuse hypokinesis. Doppler parameters are consistent with restrictive left ventricular relaxation (grade 3 diastolic dysfunction). The E/e&' ratio is >20, suggesting markedly elevated LV filling pressure. - Aortic valve: Sclerosis without stenosis. There was trivial regurgitation. - Mitral valve: Mildly thickened leaflets . There was moderate regurgitation. - Left atrium: Severely dilated. -  Right ventricle: The cavity size was mildly dilated. AICD noted in right ventricle. - Right atrium: The atrium was mildly dilated. AICD noted in right atrium. - Tricuspid valve: There was moderate regurgitation. - Pulmonary arteries: PA peak pressure: 52 mm Hg (S). - Inferior vena cava: The vessel was normal in size. The respirophasic diameter changes were in the normal range (= 50%), consistent with normal central venous pressure.  Impressions: - Compared to a prior study in 2017, the LVEF is lower at 30-35% with grade 3 diastolic dysfunction and high LV filling pressure. AICD wires are noted.  ASSESSMENT AND PLAN:  1.  Acute on Chronic Mixed CHF: He comes today with increased weight and hypertensive. He has not been compliant with low sodium diet. He has not yet taken his medications. He wants to be admitted but I have convinced him to try increased doses of lasix to 60 mg BID first. I am going to give him metolazone 2.5 mg to take tomorrow am (Tuesday) and Thursday if he has not lost fluid weight. He Dean have follow up lab on Thursday (3 days from now) with follow up appt on Friday Nov  1st. If he has not lost weight, continues to swell or has worsening breathing status, he Dean be directed to the hospital. Continue Entresto 49mg /51 mg BID as directed.     2. ICM: EF of 35%-40%. He has ICD in situ and is followed by Dr. Curt Bears for interrogation and routine cardiology. Continue amiodarone in the setting of hx of VT.   3. Hypertension: Multifactorial. Medical and dietary non-compliance. He is advised on taking Entresto and diuretics as directed, continue coreg, and avoid salt.  4. CAD: Hx of CTO of the mid RCA, Ostial RCA 90% stenosis. He continues on BB, and statin therapy.     Current medicines are reviewed at length with the patient today.    Labs/ tests ordered today include: BMET in 3 days. Phill Myron. [place] Pugh, ANP, AACC   07/22/2018 3:53 PM    [address] Office [phone] Fax [phone]

## 2018-07-22 NOTE — Telephone Encounter (Signed)
Returned call to patient.He stated he had episode of sob this morning.Stated he has a productive cough, coughing cl to foamy phlegm.Stated he has swelling in feet and ankles.No weight gain, weighed 264 lbs this morning.Stated he would like to be seen.Appointment scheduled with Jory Sims DNP this afternoon at 2:00 pm.

## 2018-07-22 NOTE — Patient Instructions (Signed)
Medication Instructions:  INCREASE LASIX 60MG  TWICE DAILY   START METOLAZONE 2.5MG  Tuesday AND Thursday ONLY  If you need a refill on your cardiac medications before your next appointment, please call your pharmacy.  Labwork: BMET THURSDAY HERE IN OUR OFFICE AT [facility]  Take the provided lab slips with you to the lab for your blood draw.   You will NOT need to fast   If you have labs (blood work) drawn today and your tests are completely normal, you will receive your results only by: [name] MyChart Message (if you have MyChart) OR . A paper copy in the mail If you have any lab test that is abnormal or we need to change your treatment, we will call you to review the results.  Special Instructions: IF SWELLING DOES NOT GO DOWN BY Thursday GO DIRECTLY TO THE HOSPITAL.  IF SWELLING GOES DOWN ON Wednesday FOLLOW UP ON Friday 07-26-2018  Follow-Up: You will need a follow up appointment in [place] 08-01-2018.    You may see  Fabian Sharp, PA-C   At [facility], you and your health needs are our priority.  As part of our continuing mission to provide you with exceptional heart care, we have created designated Provider Care Teams.  These Care Teams include your primary Cardiologist (physician) and Advanced Practice Providers (APPs -  Physician Assistants and Nurse Practitioners) who all work together to provide you with the care you need, when you need it.  Thank you for choosing [facility] at [facility]!!

## 2018-07-22 NOTE — Telephone Encounter (Signed)
New message    Pt c/o Shortness Of Breath: STAT if SOB developed within the last 24 hours or pt is noticeably SOB on the phone  1. Are you currently SOB (can you hear that pt is SOB on the phone)? no  2. How long have you been experiencing SOB? 2 weeks   3. Are you SOB when sitting or when up moving around? Both   4. Are you currently experiencing any other symptoms? Constant cough , feet and legs are swelling , he is on a fluid pill but it does not seem to be helping   Pt c/o swelling: STAT is pt has developed SOB within 24 hours  1) How much weight have you gained and in what time span? 7lbs heavier than what he was when he was released from the hospital (September)  2) If swelling, where is the swelling located? Lower legs and feet   3) Are you currently taking a fluid pill? Yes   4) Are you currently SOB? yes  5) Do you have a log of your daily weights (if so, list)? no  6) Have you gained 3 pounds in a day or 5 pounds in a week? no  7) Have you traveled recently?  no

## 2018-07-24 ENCOUNTER — Other Ambulatory Visit (INDEPENDENT_AMBULATORY_CARE_PROVIDER_SITE_OTHER): Payer: Medicare HMO

## 2018-07-24 ENCOUNTER — Ambulatory Visit: Payer: Medicare HMO | Admitting: Adult Health

## 2018-07-24 ENCOUNTER — Ambulatory Visit (INDEPENDENT_AMBULATORY_CARE_PROVIDER_SITE_OTHER): Payer: Medicare HMO | Admitting: Pulmonary Disease

## 2018-07-24 ENCOUNTER — Encounter: Payer: Self-pay | Admitting: Pulmonary Disease

## 2018-07-24 VITALS — BP 142/78 | HR 67 | Ht 66.0 in | Wt 260.8 lb

## 2018-07-24 DIAGNOSIS — R0602 Shortness of breath: Secondary | ICD-10-CM | POA: Diagnosis not present

## 2018-07-24 DIAGNOSIS — R942 Abnormal results of pulmonary function studies: Secondary | ICD-10-CM

## 2018-07-24 DIAGNOSIS — R05 Cough: Secondary | ICD-10-CM

## 2018-07-24 DIAGNOSIS — R059 Cough, unspecified: Secondary | ICD-10-CM

## 2018-07-24 LAB — CBC WITH DIFFERENTIAL/PLATELET
Basophils Absolute: 0.1 10*3/uL (ref 0.0–0.1)
Basophils Relative: 0.8 % (ref 0.0–3.0)
Eosinophils Absolute: 0.3 10*3/uL (ref 0.0–0.7)
Eosinophils Relative: 3.5 % (ref 0.0–5.0)
HCT: 37.1 % — ABNORMAL LOW (ref 39.0–52.0)
Hemoglobin: 11.9 g/dL — ABNORMAL LOW (ref 13.0–17.0)
Lymphocytes Relative: 16.4 % (ref 12.0–46.0)
Lymphs Abs: 1.4 10*3/uL (ref 0.7–4.0)
MCHC: 32 g/dL (ref 30.0–36.0)
MCV: 82.3 fl (ref 78.0–100.0)
Monocytes Absolute: 0.7 10*3/uL (ref 0.1–1.0)
Monocytes Relative: 8.1 % (ref 3.0–12.0)
Neutro Abs: 6 10*3/uL (ref 1.4–7.7)
Neutrophils Relative %: 71.2 % (ref 43.0–77.0)
Platelets: 249 10*3/uL (ref 150.0–400.0)
RBC: 4.51 Mil/uL (ref 4.22–5.81)
RDW: 15.8 % — ABNORMAL HIGH (ref 11.5–15.5)
WBC: 8.5 10*3/uL (ref 4.0–10.5)

## 2018-07-24 LAB — POCT EXHALED NITRIC OXIDE: FeNO level (ppb): 24

## 2018-07-24 MED ORDER — FLUTICASONE FUROATE-VILANTEROL 200-25 MCG/INH IN AEPB: 1.0000 | INHALATION_SPRAY | Freq: Every day | RESPIRATORY_TRACT | 5 refills | Status: DC

## 2018-07-24 NOTE — Patient Instructions (Addendum)
We will check some labs today including CBC with differential and IgE We will schedule you for high-resolution CT  Follow-up in clinic in 2 to 4 weeks.

## 2018-07-24 NOTE — Progress Notes (Signed)
Dean Sherrin Sr.    [id number]    12-04-1950  Primary Care Physician:Moreira, Carloyn Manner, MD  Referring Physician: [name], MD [id number], [place] [phone]  Chief complaint: Consult for abnormal PFTs  HPI: 67 y/o HTN, chronic combined systolic and diastolic heart failure, ischenic cardiomyopathy, EF of 35%-40% with akinesis and mid basal inferior and apical myocardium with Grade 2 diastolic dysfunction, CAD with CTO of RCA, NSVT, and CKD stage III.VT/V. fib status post ICD, chronic systolic heart failure,hyperlipidemia, hypertension, diabetes, and OSA on CPAP.   He is being monitored while on amiodarone therapy.  He had PFTs this month which showed restriction and diffusion impairment and has been referred here for further evaluation. He has complains of nonproductive, dyspnea on exertion.  No  Pets: No pets Occupation: Used to work in [employer] Exposures: Has remote exposure to asbestos and mold Smoking history: Never smoker Travel history: No significant travel history Relevant family history: No significant history of lung disease.  Outpatient Encounter Medications as of 07/24/2018  Medication Sig  . allopurinol (ZYLOPRIM) 300 MG tablet Take 300 mg by mouth daily.  [name] amiodarone (PACERONE) 200 MG tablet TAKE 1 TABLET BY MOUTH EVERY DAY  . aspirin EC 81 MG EC tablet Take 1 tablet (81 mg total) by mouth daily.  [name] atorvastatin (LIPITOR) 40 MG tablet Take 1 tablet (40 mg total) by mouth daily at 6 PM.  . benzonatate (TESSALON) 100 MG capsule Take 1 capsule (100 mg total) by mouth 2 (two) times daily as needed for cough.  . carvedilol (COREG) 25 MG tablet Take 1 tablet (25 mg total) by mouth 2 (two) times daily with a meal.  . CVS VITAMIN D3 1000 units capsule Take 1,000 Units by mouth daily.  . furosemide (LASIX) 40 MG tablet Take 1.5 tablets (60 mg total) by mouth 2 (two) times daily.  . magnesium oxide (MAG-OX) 400 MG tablet Take 200 mg by mouth 2 (two)  times daily.  . metolazone (ZAROXOLYN) 2.5 MG tablet Take 1 tablet (2.5 mg total) by mouth daily. TAKE TODAY AND Thursday CALL WED  . pantoprazole (PROTONIX) 40 MG tablet Take 1 tablet (40 mg total) by mouth daily.  . sacubitril-valsartan (ENTRESTO) 49-51 MG Take 1 tablet by mouth 2 (two) times daily.  . [DISCONTINUED] furosemide (LASIX) 40 MG tablet Take 1.5 tablets (60 mg total) by mouth daily.   No facility-administered encounter medications on file as of 07/24/2018.     Allergies as of 07/24/2018 - Review Complete 07/24/2018  Allergen Reaction Noted  . Contrast media [iodinated diagnostic agents] Shortness Of Breath and Nausea And Vomiting 10/10/2016  . Lisinopril Cough 09/19/2016    Past Medical History:  Diagnosis Date  . 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 Arthritis    "maybe in his spine" (09/05/2016)  . Asthma    pt. denies  . Cardiac arrest ([place]) 08/27/2016   REQUIRING CPR, SHOCK & MEDICATIONS  . CHF (congestive heart failure) ([place])   . Coronary artery disease    90% ostRCA stenosis and total occlusion of dRCA with L-->R collaterals 2017  . Dysrhythmia   . Gout   . High cholesterol   . Hypertension   . Ischemic cardiomyopathy   . LBBB (left bundle branch block)   . OSA on CPAP   . Type II diabetes mellitus ([place])     Past Surgical History:  Procedure Laterality Date  . CARDIAC CATHETERIZATION N/A 09/07/2016   Procedure:  Left Heart Cath and Coronary Angiography;  Surgeon: Peter M Martinique, MD;  Location: [facility];  Service: Cardiovascular;  Laterality: N/A;  . EP IMPLANTABLE DEVICE N/A 10/10/2016   Procedure: ICD Implant;  Surgeon: Will Meredith Leeds, MD;  Location: [facility];  Service: Cardiovascular;  Laterality: N/A;  . LUMBAR LAMINECTOMY/DECOMPRESSION MICRODISCECTOMY N/A 01/18/2018   Procedure: L2-3, L3-4, L-4-5 CENTRAL DECOMPRESSION;  Surgeon: [name], MD;  Location: [facility];  Service: Orthopedics;  Laterality:  N/A;    Family History  Problem Relation Age of Onset  . Heart attack Father        died in his 11's  . Hypertension Sister   . Cancer Brother        uncertain, "leg"  . Hypertension Sister     Social History   Socioeconomic History  . Marital status: Married    Spouse name: Not on file  . Number of children: Not on file  . Years of education: Not on file  . Highest education level: Not on file  Occupational History  . Not on file  Social Needs  . Financial resource strain: Not on file  . Food insecurity:    Worry: Not on file    Inability: Not on file  . Transportation needs:    Medical: Not on file    Non-medical: Not on file  Tobacco Use  . Smoking status: Never Smoker  . Smokeless tobacco: Never Used  Substance and Sexual Activity  . Alcohol use: No  . Drug use: No  . Sexual activity: Not on file  Lifestyle  . Physical activity:    Days per week: Not on file    Minutes per session: Not on file  . Stress: Not on file  Relationships  . Social connections:    Talks on phone: Not on file    Gets together: Not on file    Attends religious service: Not on file    Active member of club or organization: Not on file    Attends meetings of clubs or organizations: Not on file    Relationship status: Not on file  . Intimate partner violence:    Fear of current or ex partner: Not on file    Emotionally abused: Not on file    Physically abused: Not on file    Forced sexual activity: Not on file  Other Topics Concern  . Not on file  Social History Narrative  . Not on file    Review of systems: Review of Systems  Constitutional: Negative for fever and chills.  HENT: Negative.   Eyes: Negative for blurred vision.  Respiratory: as per HPI  Cardiovascular: Negative for chest pain and palpitations.  Gastrointestinal: Negative for vomiting, diarrhea, blood per rectum. Genitourinary: Negative for dysuria, urgency, frequency and hematuria.  Musculoskeletal:  Negative for myalgias, back pain and joint pain.  Skin: Negative for itching and rash.  Neurological: Negative for dizziness, tremors, focal weakness, seizures and loss of consciousness.  Endo/Heme/Allergies: Negative for environmental allergies.  Psychiatric/Behavioral: Negative for depression, suicidal ideas and hallucinations.  All other systems reviewed and are negative.  Physical Exam: Blood pressure (!) 142/78, pulse 67, height 5\' 6"  (1.676 m), weight 260 lb 12.8 oz (118.3 kg), SpO2 97 %. Gen:      No acute distress HEENT:  EOMI, sclera anicteric Neck:     No masses; no thyromegaly Lungs:    Clear to auscultation bilaterally; normal respiratory effort CV:  Regular rate and rhythm; no murmurs Abd:      + bowel sounds; soft, non-tender; no palpable masses, no distension Ext:    No edema; adequate peripheral perfusion Skin:      Warm and dry; no rash Neuro: alert and oriented x 3 Psych: normal mood and affect  Data Reviewed: Imaging: CT chest 08/27/2016-no pulmonary embolism, basilar pulmonary collapse/consolidation, aortic atherosclerosis Chest x-ray 05/15/2018-cardiomegaly, pulmonary vascular congestion.  AICD in place I reviewed the images personally.  PFTs: 07/11/2018 FVC 1.68 (51%), FEV1 1.31 [50%), F/F 78, TLC 72%, RV/TLC 173%, DLCO 52%, DLCO/VA 111% Mild restriction with moderate diffusion impairment which corrects for alveolar volume Air-trapping.  FENO 07/24/1989-24  Labs: Pending  Assessment:  Abnormal PFTs Reviewed PFTs which show mostly mild restriction with moderate diffusion impairment.  There is a read of severe obstruction but on my review this is minimal.  He does have some mild trapping and improvement in flow rates post albuterol which may indicate minimal reactive airway disease. We will check a CBC with differential and IgE for further evaluation.  Give sample of Breo and assess response to therapy.   Suspect that the restriction and diffusion  impairment may be related to his heart failure We will evaluate for amiodarone toxicity with a high-resolution CT Follow back in clinic in 2 to 4 weeks for review and plan for further steps  Plan/Recommendations: - High res CT - CBC with differential, IgE - Sample of Breo  Follow-up in clinic in 2 to 4 weeks.  Marshell Garfinkel MD [facility] 07/24/2018, 2:33 PM  CC: [name], MD

## 2018-07-25 LAB — IGE: IgE (Immunoglobulin E), Serum: 941 kU/L — ABNORMAL HIGH (ref <=114)

## 2018-07-25 NOTE — Progress Notes (Signed)
Cardiology Office Note   Date:  07/26/2018   ID:  Dean Bones Sr., DOB Dec 26, 1950, MRN [id number]  PCP:  [name], MD  Cardiologist: Dr. Curt Bears, MD   Chief Complaint  Patient presents with  . Follow-up  . Congestive Heart Failure  . Chronic Kidney Disease  . Hypertension    History of Present Illness: Dean Dangerfield Sr. is a 67 y.o. male who presents for HF follow up, seen for Dr. Ileana Ladd.   Dean [name] has a hx of HTN, chronic combined systolic and diastolic heart failure, ischemic cardiomyopathy with an EF of 35%-40% with akinesis and mid basal inferior and apical myocardium with G2DD (per echo 04/2018),CAD with CTO of RCA (per cath 08/2016), NSVT and CKD stage III, hx of VT/VF s/p ICD (09/2016),hyperlipidemia, hypertension, diabetes, and OSA on CPAP.   He was seen by Fabian Sharp, PA and had complaints of BLE and was noted to have run out of his [name] and was complaining of a nagging cough. He was noted to have gained 7 lbs. His Entresto was increased to 49/51 mg BID and he was continued on lasix 60 mg daily. He was also started on Tessalon Perles and PPI for cough with plans for alternative measures if cough worsened with increased dose of Entresto. PFT's were ordered.   On 07/22/18 he was seen by Jory Sims, NP for close follow up to the above. During that time he was found to have gained gained 3 more pounds (to a total of 10). He had not been compliant with his low sodium diet including eating pizza and fast food. He reported weighing himself  And taking his BP daily. Given his symptoms, he had wishes to proceed to the hospital, however it was suggested that he could be managed just fine OP if he was complaint with diet and meds.  He was then seen by pulmonology on 07/24/18 with complaints of nonproductive cough and dyspnea on exertion. PFT's were reveiwed and reported at severe obstruction however re-reviewd by pulomonolgy MD and thought to be more of   moderate diffusion impairment with some mild trapping. CBC and IgE were drawn. It was suspected that the restriction and diffusion impairment was related to his chronic HF. A high-resolution chest CT was ordered and is schedule for 08/12/18 to assess for possible Amiodarone lung toxicity. He is to follow back in 2-4 weeks with pulmonology.   Today he reports that he has been watching what he is eating, including staying away from high sodium foods such as fast and canned foods. He reports that his cough has improved with decreased weight, the addition of Breo from pulmonary and the adjustment in Lasix. He has been weighing himself daily and has steadily been losing weight. His weight was down to 260lb (from 267lb when seen by Jory Sims) on 07/24/18 with pulmonary and he is 255.8lb today on exam. He denies chest pain, palpitations, LE swelling, orthopnea symptoms, dizziness, or syncopal episodes. He has moderate LE swelling, however he states that it is significantly improved from when he was here last.   Past Medical History:  Diagnosis Date  . 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 Arthritis    "maybe in his spine" (09/05/2016)  . Asthma    pt. denies  . Cardiac arrest ([place]) 08/27/2016   REQUIRING CPR, SHOCK & MEDICATIONS  . CHF (congestive heart failure) ([place])   . Coronary artery disease    90% ostRCA stenosis and total occlusion of dRCA with  L-->R collaterals 2017  . Dysrhythmia   . Gout   . High cholesterol   . Hypertension   . Ischemic cardiomyopathy   . LBBB (left bundle branch block)   . OSA on CPAP   . Type II diabetes mellitus ([place])     Past Surgical History:  Procedure Laterality Date  . CARDIAC CATHETERIZATION N/A 09/07/2016   Procedure: Left Heart Cath and Coronary Angiography;  Surgeon: Peter M Martinique, MD;  Location: [facility];  Service: Cardiovascular;  Laterality: N/A;  . EP IMPLANTABLE DEVICE N/A 10/10/2016   Procedure: ICD  Implant;  Surgeon: Will Meredith Leeds, MD;  Location: [facility];  Service: Cardiovascular;  Laterality: N/A;  . LUMBAR LAMINECTOMY/DECOMPRESSION MICRODISCECTOMY N/A 01/18/2018   Procedure: L2-3, L3-4, L-4-5 CENTRAL DECOMPRESSION;  Surgeon: [name], MD;  Location: [facility];  Service: Orthopedics;  Laterality: N/A;    Current Outpatient Medications  Medication Sig Dispense Refill  . allopurinol (ZYLOPRIM) 300 MG tablet Take 300 mg by mouth daily.    [name] amiodarone (PACERONE) 200 MG tablet TAKE 1 TABLET BY MOUTH EVERY DAY 30 tablet 7  . aspirin EC 81 MG EC tablet Take 1 tablet (81 mg total) by mouth daily.    [name] atorvastatin (LIPITOR) 40 MG tablet Take 1 tablet (40 mg total) by mouth daily at 6 PM. 30 tablet 5  . benzonatate (TESSALON) 100 MG capsule Take 1 capsule (100 mg total) by mouth 2 (two) times daily as needed for cough. 60 capsule 0  . carvedilol (COREG) 25 MG tablet Take 1 tablet (25 mg total) by mouth 2 (two) times daily with a meal. 60 tablet 7  . CVS VITAMIN D3 1000 units capsule Take 1,000 Units by mouth daily.  5  . fluticasone furoate-vilanterol (BREO ELLIPTA) 200-25 MCG/INH AEPB Inhale 1 puff into the lungs daily. 28 each 5  . furosemide (LASIX) 40 MG tablet Take 1.5 tablets (60 mg total) by mouth 2 (two) times daily. 45 tablet 0  . magnesium oxide (MAG-OX) 400 MG tablet Take 200 mg by mouth 2 (two) times daily.    . pantoprazole (PROTONIX) 40 MG tablet Take 1 tablet (40 mg total) by mouth daily. 30 tablet 5  . sacubitril-valsartan (ENTRESTO) 49-51 MG Take 1 tablet by mouth 2 (two) times daily. 60 tablet 5  . metolazone (ZAROXOLYN) 2.5 MG tablet Take 1 tablet (2.5 mg total) by mouth daily. 5 tablet 0   No current facility-administered medications for this visit.     Allergies:   Contrast media [iodinated diagnostic agents] and Lisinopril    Social History:  The patient  reports that he has never smoked. He has never used smokeless tobacco. He reports that he does not  drink alcohol or use drugs.   Family History:  The patient's family history includes Cancer in his brother; Heart attack in his father; Hypertension in his sister and sister.   ROS:  Please see the history of present illness.   Otherwise, review of systems are positive for none.   All other systems are reviewed and negative.   PHYSICAL EXAM: VS:  BP (!) 148/64   Pulse 65   Ht 5\' 6"  (1.676 m)   Wt 255 lb 12.8 oz (116 kg)   SpO2 95%   BMI 41.29 kg/m  , BMI Body mass index is 41.29 kg/m.  General: Overweight, NAD Skin: Warm, dry, intact  Head: Normocephalic, atraumatic, clear, moist mucus membranes. Neck: Negative for carotid bruits. No JVD  Lungs:Clear to ausculation bilaterally. No wheezes, rales, or rhonchi. Breathing is unlabored. Cardiovascular: RRR with S1 S2. No murmurs, rubs, gallops, or LV heave appreciated. Abdomen: Soft, non-tender, non-distended with normoactive bowel sounds.  MSK: Strength and tone appear normal for age. 5/5 in all extremities Extremities: 1+ BLE edema. No clubbing or cyanosis. DP/PT pulses 2+ bilaterally Neuro: Alert and oriented. No focal deficits. No facial asymmetry. MAE spontaneously. Psych: Responds to questions appropriately with normal affect.    EKG:  EKG is not ordered today.  Recent Labs: 05/15/2018: B Natriuretic Peptide 679.6 05/16/2018: Magnesium 1.7 05/18/2018: ALT 23 07/24/2018: Hemoglobin 11.9; Platelets 249.0 07/25/2018: BUN 21; Creatinine, Ser 1.57; Potassium 4.0; Sodium 138   Lipid Panel    Component Value Date/Time   CHOL 159 09/07/2016 0844   TRIG 146 09/07/2016 0844   HDL 25 (L) 09/07/2016 0844   CHOLHDL 6.4 09/07/2016 0844   VLDL 29 09/07/2016 0844   LDLCALC 105 (H) 09/07/2016 0844     Wt Readings from Last 3 Encounters:  07/26/18 255 lb 12.8 oz (116 kg)  07/24/18 260 lb 12.8 oz (118.3 kg)  07/22/18 267 lb 6.4 oz (121.3 kg)    Other studies Reviewed: Additional studies/ records that were reviewed today  include:  Echo 05/16/18: Study Conclusions - Left ventricle: The cavity size was normal. Wall thickness was increased in a pattern of moderate LVH. Systolic function was moderately to severely reduced. The estimated ejection fraction was in the range of 30% to 35%. No mural thrombus by Definity contrast. Diffuse hypokinesis. Doppler parameters are consistent with restrictive left ventricular relaxation (grade 3 diastolic dysfunction). The E/e&' ratio is >20, suggesting markedly elevated LV filling pressure. - Aortic valve: Sclerosis without stenosis. There was trivial regurgitation. - Mitral valve: Mildly thickened leaflets . There was moderate regurgitation. - Left atrium: Severely dilated. - Right ventricle: The cavity size was mildly dilated. AICD noted in right ventricle. - Right atrium: The atrium was mildly dilated. AICD noted in right atrium. - Tricuspid valve: There was moderate regurgitation. - Pulmonary arteries: PA peak pressure: 52 mm Hg (S). - Inferior vena cava: The vessel was normal in size. The respirophasic diameter changes were in the normal range (= 50%), consistent with normal central venous pressure.  Impressions: - Compared to a prior study in 2017, the LVEF is lower at 30-35% with grade 3 diastolic dysfunction and high LV filling pressure. AICD wires are noted.  LHC 09/07/2016:  There is moderate left ventricular systolic dysfunction.  LV end diastolic pressure is normal.  The left ventricular ejection fraction is 35-45% by visual estimate.  Ost RCA lesion, 90 %stenosed.  Mid RCA to Dist RCA lesion, 100 %stenosed.   1. Single vessel occlusive CAD. The distal RCA is occluded with left to right collaterals. There is a 90% ostial stenosis 2. Moderate LV dysfunction with EF 35-40%. Akinesis of the basal to mid inferior wall 3. Normal LVEDP  Plan: medical therapy. If he has significant angina we could consider PCI of  the RCA but there appears to be significant scar of the inferior wall.  ASSESSMENT AND PLAN:  1.  Acute on chronic systolic and diastolic CHF with LVEF of 30-35% and G3DD: -Last seen in our office 10/28 with weight gain and was significantly hypertensive. He was noted to have not been compplaint with his low sodium diet and was wanting to be admitted to the hospital however provider thought he was able to be managed outpatient if he were to comply. His Lasix  was increased to 60mg  twice daily and he was given metolazone 2.5 mg to take on Tuesday and Thursday if he had not lost fluid weight. He under went labs on 07/25/18 which showed an elevated creatinine at 1.57. Weight on 07/22/18 during last cardiology visit>267lb -Weight on 07/24/18 at [facility] office visit>260lb -Weight down to 255.8lb today with symptom improvement  -Will continue Lasix 60mg  twice daily and change his Metolazone to PRN for weight gain of 3 lb overnight or 5lb in one week. He was instructed to call the office any time he is needing to use the Metolazone.  -Recheck BMET in 2 weeks to assess renal function  -Follow with Jory Sims, NP in 1 month  -Continue Entresto at 49/51mg   -Continue ASA, carvedilol, Entresto, atorvastatin   2. ICM: -Pt with a hx of VT/VF arrest in 08/2016 with follow up catheterization showing moderate LV dysfunction with an estimated at 30-35%, ostial RCA was 90% stenosed, mid right to distal right 100% stenosed with plans for medical therapy however could consider PCI of RCA but would need to consider significant scar of the inferior wall.  Subsequently ha had an ICD placed in 09/2016.  -Stable, AICD last interrogated 04/03/2018 with normal results, battery and leads noted to be stable.  -Continue Amiodarone for now>>>pt is undergoing chest CT per Pulmonary medicine secondary to ongoing cough and recent abnormal PFT's with concern for possible Amiodarone toxicity  -Continue for now and reassess  based on CT results  -CT scheduled for 08/12/18 with close follow up with pulmonary   3. HTN: -Stable, but elevated today at 148/64.  -Pt reports that he has been taking it daily at home and runs 174 systolic.  -Continue current regimen   4. CAD: -Denies chest pain or other anginal symptoms. -Continue ASA, carvedilol, statin, Entresto  5. CKD Stage II: -Creatinine, 1.57 on BMET 07/25/18 -Baseline appears to be in the 1.4-1.6 range -Given that he had a significant jump from 1.12 on 07/10/18 prior to two doses of Metolazone 2.5mg  and the increase of his Lasix to 60mg  twice daily>>will repeat BMET in 2 weeks in hopes that it trends down a bit  Current medicines are reviewed at length with the patient today.  The patient does not have concerns regarding medicines.  The following changes have been made: Change Metolazone 2.5mg  to PRN for weight gain of 3 lb overnight or 5lb in one week and call the office if you have to use.    Labs/ tests ordered today include: BMET in 2 weeks   Orders Placed This Encounter  Procedures  . Basic metabolic panel   Disposition:   FU with Jory Sims, NP in 1 month  Signed, Kathyrn Drown, NP  07/26/2018 2:28 PM    [facility] [address], [place], [place]  [address] Phone: [phone]; Fax: [phone]

## 2018-07-26 ENCOUNTER — Ambulatory Visit: Payer: Medicare HMO | Admitting: Cardiology

## 2018-07-26 ENCOUNTER — Encounter: Payer: Self-pay | Admitting: Cardiology

## 2018-07-26 VITALS — BP 148/64 | HR 65 | Ht 66.0 in | Wt 255.8 lb

## 2018-07-26 DIAGNOSIS — N183 Chronic kidney disease, stage 3 unspecified: Secondary | ICD-10-CM

## 2018-07-26 DIAGNOSIS — I5043 Acute on chronic combined systolic (congestive) and diastolic (congestive) heart failure: Secondary | ICD-10-CM | POA: Diagnosis not present

## 2018-07-26 DIAGNOSIS — I10 Essential (primary) hypertension: Secondary | ICD-10-CM | POA: Diagnosis not present

## 2018-07-26 DIAGNOSIS — I255 Ischemic cardiomyopathy: Secondary | ICD-10-CM

## 2018-07-26 DIAGNOSIS — E119 Type 2 diabetes mellitus without complications: Secondary | ICD-10-CM | POA: Diagnosis not present

## 2018-07-26 LAB — BASIC METABOLIC PANEL
BUN/Creatinine Ratio: 13 (ref 10–24)
BUN: 21 mg/dL (ref 8–27)
CO2: 25 mmol/L (ref 20–29)
Calcium: 10.3 mg/dL — ABNORMAL HIGH (ref 8.6–10.2)
Chloride: 96 mmol/L (ref 96–106)
Creatinine, Ser: 1.57 mg/dL — ABNORMAL HIGH (ref 0.76–1.27)
GFR calc Af Amer: 52 mL/min/{1.73_m2} — ABNORMAL LOW (ref >59)
GFR calc non Af Amer: 45 mL/min/{1.73_m2} — ABNORMAL LOW (ref >59)
Glucose: 89 mg/dL (ref 65–99)
Potassium: 4 mmol/L (ref 3.5–5.2)
Sodium: 138 mmol/L (ref 134–144)

## 2018-07-26 MED ORDER — METOLAZONE 2.5 MG PO TABS: 2.5000 mg | ORAL_TABLET | Freq: Every day | ORAL | 0 refills | Status: DC

## 2018-07-26 NOTE — Patient Instructions (Addendum)
Medication Instructions:  Take Metolazone 2.5 mg as needed for swelling. (gaining 3lbs or more over night, 5 lbs or more in a week)  If you need a refill on your cardiac medications before your next appointment, please call your pharmacy.   Lab work: Your physician recommends that you return for lab work in: 2 weeks--BMET  If you have labs (blood work) drawn today and your tests are completely normal, you will receive your results only by: [name] MyChart Message (if you have MyChart) OR . A paper copy in the mail If you have any lab test that is abnormal or we need to change your treatment, we will call you to review the results.  Testing/Procedures: none  Follow-Up: At [facility], you and your health needs are our priority.  As part of our continuing mission to provide you with exceptional heart care, we have created designated Provider Care Teams.  These Care Teams include your primary Cardiologist (physician) and Advanced Practice Providers (APPs -  Physician Assistants and Nurse Practitioners) who all work together to provide you with the care you need, when you need it. . You have been scheduled for a follow-up appointment with Jory Sims, NP on Monday, December 9 at 9 AM. Please arrive 15 minutes prior to your appointment time.  Any Other Special Instructions Will Be Listed Below (If Applicable). none

## 2018-07-30 ENCOUNTER — Other Ambulatory Visit: Payer: Self-pay | Admitting: Gastroenterology

## 2018-07-30 ENCOUNTER — Telehealth: Payer: Self-pay

## 2018-07-30 NOTE — Telephone Encounter (Signed)
   [facility] Pre-operative Risk Assessment    Request for surgical clearance:  1. What type of surgery is being performed? Colonoscopy   2. When is this surgery scheduled? 08/30/18   3. What type of clearance is required (medical clearance vs. Pharmacy clearance to hold med vs. Both)? Both  4. Are there any medications that need to be held prior to surgery and how long?  Patient is on ASA, but not asked to Address on clearance   5. Practice name and name of physician performing surgery?  [facility] , Dr. Benson Norway   6. What is your office phone number [phone]    7.   What is your office fax number  [phone]  8.   Anesthesia type (None, local, MAC, general) ? Propofol   Ewell Poe Ingalls 07/30/2018, 11:26 AM  _________________________________________________________________   (provider comments below)

## 2018-08-01 ENCOUNTER — Ambulatory Visit: Payer: Medicare HMO | Admitting: Physician Assistant

## 2018-08-01 NOTE — Telephone Encounter (Addendum)
   Primary [place], MD  Chart reviewed as part of pre-operative protocol coverage.  Patient is being actively managed for acute on chronic CHF, last seen 07/26/18 by Kathyrn Drown, NP who recommended f/u BMET in 2 weeks and f/u with Jory Sims in 1 month. Callback staff, can you please make sure patient is aware he still needs labs (around 11/15) and try to move up his appointment with Curt Bears to occur before the colonoscopy? That way we can ensure his volume status is optimized before undergoing sedation. Appt is now 12/9 but his colonoscopy is 12/6. I do see Curt Bears has several openings later this month. Will also cc to Kathryn/Jill to make them aware as an FYI.  Preop callback, please also contact requesting surgeon's office via preferred method (i.e, phone, fax) to inform them of desire for appointment prior to surgery.  Charlie Pitter, PA-C  08/01/2018, 4:45 PM

## 2018-08-02 ENCOUNTER — Ambulatory Visit: Payer: Medicare HMO | Admitting: Physician Assistant

## 2018-08-02 NOTE — Telephone Encounter (Signed)
Spoke with pt re: surgical clearance. Pt has been made aware that he is due lab work 08/09/18 @ the [facility] office. Pt also made aware that his appt has been r/s to see Jory Sims, NP to 08/26/18, so we can see him back before his scheduled Colonoscopy and possible clearance on 08/30/18. Pt thanked me for all the help and verbalized understanding.   Will fax over to surgeons office as well to make them aware.

## 2018-08-04 NOTE — Telephone Encounter (Signed)
Thank you :)

## 2018-08-07 LAB — CUP PACEART REMOTE DEVICE CHECK
Battery Remaining Longevity: 128 mo
Battery Voltage: 3.03 V
Brady Statistic RV Percent Paced: 0.04 %
Date Time Interrogation Session: 20191009101813
HighPow Impedance: 64 Ohm
Implantable Lead Implant Date: 20180116
Implantable Lead Location: 753860
Implantable Lead Model: 6935
Implantable Pulse Generator Implant Date: 20180116
Lead Channel Impedance Value: 266 Ohm
Lead Channel Impedance Value: 380 Ohm
Lead Channel Pacing Threshold Amplitude: 0.875 V
Lead Channel Pacing Threshold Pulse Width: 0.4 ms
Lead Channel Sensing Intrinsic Amplitude: 14.5 mV
Lead Channel Sensing Intrinsic Amplitude: 14.5 mV
Lead Channel Setting Pacing Amplitude: 2.5 V
Lead Channel Setting Pacing Pulse Width: 0.4 ms
Lead Channel Setting Sensing Sensitivity: 0.3 mV

## 2018-08-10 LAB — BASIC METABOLIC PANEL
BUN/Creatinine Ratio: 15 (ref 10–24)
BUN: 30 mg/dL — ABNORMAL HIGH (ref 8–27)
CO2: 23 mmol/L (ref 20–29)
Calcium: 10.4 mg/dL — ABNORMAL HIGH (ref 8.6–10.2)
Chloride: 101 mmol/L (ref 96–106)
Creatinine, Ser: 2.02 mg/dL — ABNORMAL HIGH (ref 0.76–1.27)
GFR calc Af Amer: 38 mL/min/{1.73_m2} — ABNORMAL LOW (ref >59)
GFR calc non Af Amer: 33 mL/min/{1.73_m2} — ABNORMAL LOW (ref >59)
Glucose: 97 mg/dL (ref 65–99)
Potassium: 4.6 mmol/L (ref 3.5–5.2)
Sodium: 140 mmol/L (ref 134–144)

## 2018-08-12 ENCOUNTER — Ambulatory Visit (INDEPENDENT_AMBULATORY_CARE_PROVIDER_SITE_OTHER)
Admission: RE | Admit: 2018-08-12 | Discharge: 2018-08-12 | Disposition: A | Discharge status: Home / Self Care | Payer: Medicare HMO | Source: Ambulatory Visit | Attending: Pulmonary Disease | Admitting: Pulmonary Disease

## 2018-08-12 ENCOUNTER — Telehealth: Payer: Self-pay | Admitting: Pulmonary Disease

## 2018-08-12 DIAGNOSIS — K769 Liver disease, unspecified: Secondary | ICD-10-CM

## 2018-08-12 DIAGNOSIS — R0602 Shortness of breath: Secondary | ICD-10-CM | POA: Diagnosis not present

## 2018-08-12 NOTE — Telephone Encounter (Signed)
ADDENDUM REPORT: 08/12/2018 10:41  ADDENDUM: In the upper abdomen there are some subtle intermediate attenuation lesions in the liver measuring 5.1 x 4.4 cm in segment 2 (axial image 126 of series 2) and 7.8 x 5.3 cm in the right lobe of the liver involving segments 7 and 8 (axial image 115 of series 2). These lesions are incompletely characterized on today's examination, and not confidently identified on the prior study from 08/27/2016. Further evaluation with nonemergent MRI of the abdomen with and without IV gadolinium is recommended to characterize these lesions and exclude malignancy.  These results will be called to the ordering clinician or representative by the Radiologist Assistant, and communication documented in the PACS or zVision Dashboard.   Electronically Signed   By: Vinnie Langton M.D.   On: 08/12/2018 10:41  Received a call report from the radiologist about the above addendum for patient's CT High Res Scan. Will route to Dr. Vaughan Browner so he is aware.

## 2018-08-13 NOTE — Telephone Encounter (Signed)
Call pt to inform that CT shows minimal lung scarring, plaque build up in heart There are lesions in liver that will need further investigation.  Order MRI of the abdomen with and without IV gadolinium.   Marshell Garfinkel MD [facility] 08/13/2018, 10:59 AM

## 2018-08-13 NOTE — Telephone Encounter (Signed)
Called patient unable to reach left message to give us a call back.

## 2018-08-20 NOTE — Progress Notes (Deleted)
Cardiology Office Note   Date:  08/20/2018   ID:  Dean Bones Sr., DOB 11/01/1950, MRN [id number]  PCP:  [name], MD  Cardiologist: [facility]  No chief complaint on file.    History of Present Illness: Dean Crace Sr. is a 67 y.o. male who presents for ongoing assessment of hypertension, chronic combined systolic and diastolic failure, ICM with EF of 35%-40% with akinesis and mid basal inferior and apical myocardium with grade II diastolic dysfunction per echo in 04/2018, hyperlipidemia, hypertension, with other history of diabetes and OSA on CPAP   He has been followed by pulmonary with CT of the chest for lung toxicity on Amiodarone. PFT's were completed which documented moderate diffusion impairment related to chronic CHF. He was seen last by Kathyrn Drown on 07/26/2018 weight was 255 lbs with no evidence of fluid overload. BMET was ordered and he was advised to use metolazone prn.   He is planned for colonoscopy and will need cardiac clearance for this procedure. BMET on 08/09/2018 revealed creatinine of 2.02. Na 140. Potassium 4.6.     Past Medical History:  Diagnosis Date  . 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 Arthritis    "maybe in his spine" (09/05/2016)  . Asthma    pt. denies  . Cardiac arrest ([place]) 08/27/2016   REQUIRING CPR, SHOCK & MEDICATIONS  . CHF (congestive heart failure) ([place])   . Coronary artery disease    90% ostRCA stenosis and total occlusion of dRCA with L-->R collaterals 2017  . Dysrhythmia   . Gout   . High cholesterol   . Hypertension   . Ischemic cardiomyopathy   . LBBB (left bundle branch block)   . OSA on CPAP   . Type II diabetes mellitus ([place])     Past Surgical History:  Procedure Laterality Date  . CARDIAC CATHETERIZATION N/A 09/07/2016   Procedure: Left Heart Cath and Coronary Angiography;  Surgeon: Peter M Martinique, MD;  Location: [facility];  Service: Cardiovascular;  Laterality: N/A;  .  EP IMPLANTABLE DEVICE N/A 10/10/2016   Procedure: ICD Implant;  Surgeon: Will Meredith Leeds, MD;  Location: [facility];  Service: Cardiovascular;  Laterality: N/A;  . LUMBAR LAMINECTOMY/DECOMPRESSION MICRODISCECTOMY N/A 01/18/2018   Procedure: L2-3, L3-4, L-4-5 CENTRAL DECOMPRESSION;  Surgeon: [name], MD;  Location: [facility];  Service: Orthopedics;  Laterality: N/A;     Current Outpatient Medications  Medication Sig Dispense Refill  . allopurinol (ZYLOPRIM) 300 MG tablet Take 300 mg by mouth daily.    [name] amiodarone (PACERONE) 200 MG tablet TAKE 1 TABLET BY MOUTH EVERY DAY 30 tablet 7  . aspirin EC 81 MG EC tablet Take 1 tablet (81 mg total) by mouth daily.    [name] atorvastatin (LIPITOR) 40 MG tablet Take 1 tablet (40 mg total) by mouth daily at 6 PM. 30 tablet 5  . benzonatate (TESSALON) 100 MG capsule Take 1 capsule (100 mg total) by mouth 2 (two) times daily as needed for cough. 60 capsule 0  . carvedilol (COREG) 25 MG tablet Take 1 tablet (25 mg total) by mouth 2 (two) times daily with a meal. 60 tablet 7  . CVS VITAMIN D3 1000 units capsule Take 1,000 Units by mouth daily.  5  . fluticasone furoate-vilanterol (BREO ELLIPTA) 200-25 MCG/INH AEPB Inhale 1 puff into the lungs daily. 28 each 5  . furosemide (LASIX) 40 MG tablet Take 1.5 tablets (60 mg total) by mouth 2 (two) times daily. 45 tablet  0  . magnesium oxide (MAG-OX) 400 MG tablet Take 200 mg by mouth 2 (two) times daily.    . metolazone (ZAROXOLYN) 2.5 MG tablet Take 1 tablet (2.5 mg total) by mouth daily. 5 tablet 0  . pantoprazole (PROTONIX) 40 MG tablet Take 1 tablet (40 mg total) by mouth daily. 30 tablet 5  . sacubitril-valsartan (ENTRESTO) 49-51 MG Take 1 tablet by mouth 2 (two) times daily. 60 tablet 5   No current facility-administered medications for this visit.     Allergies:   Contrast media [iodinated diagnostic agents] and Lisinopril    Social History:  The patient  reports that he has never smoked. He has  never used smokeless tobacco. He reports that he does not drink alcohol or use drugs.   Family History:  The patient's family history includes Cancer in his brother; Heart attack in his father; Hypertension in his sister and sister.    ROS: All other systems are reviewed and negative. Unless otherwise mentioned in H&P    PHYSICAL EXAM: VS:  There were no vitals taken for this visit. , BMI There is no height or weight on file to calculate BMI. GEN: Well nourished, well developed, in no acute distress HEENT: normal Neck: no JVD, carotid bruits, or masses Cardiac: ***RRR; no murmurs, rubs, or gallops,no edema  Respiratory:  Clear to auscultation bilaterally, normal work of breathing GI: soft, nontender, nondistended, + BS MS: no deformity or atrophy Skin: warm and dry, no rash Neuro:  Strength and sensation are intact Psych: euthymic mood, full affect   EKG:  EKG {ACTION; IS/IS [id number]:[id number]} ordered today. The ekg ordered today demonstrates ***   Recent Labs: 05/15/2018: B Natriuretic Peptide 679.6 05/16/2018: Magnesium 1.7 05/18/2018: ALT 23 07/24/2018: Hemoglobin 11.9; Platelets 249.0 08/09/2018: BUN 30; Creatinine, Ser 2.02; Potassium 4.6; Sodium 140    Lipid Panel    Component Value Date/Time   CHOL 159 09/07/2016 0844   TRIG 146 09/07/2016 0844   HDL 25 (L) 09/07/2016 0844   CHOLHDL 6.4 09/07/2016 0844   VLDL 29 09/07/2016 0844   LDLCALC 105 (H) 09/07/2016 0844      Wt Readings from Last 3 Encounters:  07/26/18 255 lb 12.8 oz (116 kg)  07/24/18 260 lb 12.8 oz (118.3 kg)  07/22/18 267 lb 6.4 oz (121.3 kg)      Other studies Reviewed: Additional studies/ records that were reviewed today include: ***. Review of the above records demonstrates: ***   ASSESSMENT AND PLAN:  1.  ***   Current medicines are reviewed at length with the patient today.    Labs/ tests ordered today include: *** Phill Myron. West Pugh, ANP, AACC   08/20/2018 3:58 PM    [address] [place] [address] Office [phone] Fax [phone]

## 2018-08-26 ENCOUNTER — Ambulatory Visit: Payer: Medicare HMO | Admitting: Adult Health

## 2018-08-26 NOTE — Telephone Encounter (Signed)
Pt is calling back about CT results [phone]

## 2018-08-26 NOTE — Telephone Encounter (Signed)
Spoke with pt. He is aware of his results. Order for MRI has been placed. Nothing further was needed.

## 2018-08-26 NOTE — Progress Notes (Signed)
Cardiology Office Note   Date:  08/28/2018   ID:  Dean Bones Sr., DOB 28-May-1951, MRN [id number]  PCP:  [name], MD  Cardiologist:  [facility]  Chief Complaint  Patient presents with  . Coronary Artery Disease  . Cardiomyopathy  . Congestive Heart Failure     History of Present Illness: Dean Dunshee Sr. is a 67 y.o. male who presents for ongoing assessment and management of of chronic combined CHF, ICM, with EF of 35%-40% with akinesis and mid basal inferior and apical myocardium with Grade II diastolic dysfunction. 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 Curt Bears), hypertension, HL Type II diabetes, and OSA on CPAP.   He was seen last by me on 07/22/2018 with evidence of volume overload but had been non-compliant with diet and medications. He was advised at that time to restrict sodium in his diet. We increased the lasix dose to 60 mg BID, given one dose of metolazone 2.5 mg, to take one day, skip a day and take the next day, only. He was continued on Entresto.   He has close follow up by Kathyrn Drown, NP on 07/26/2018 and had decreased weight from 267 lbs to 255 lbs. He was feeling better and without complaints of LEE, DOE, or palpitations. He was continued on same medication regimen, only to take metolazone prn. Recheck BMET in 2 weeks.  He had PFT's competed and did follow up with pulmonology on 07/24/18 with complaints of nonproductive cough and dyspnea on exertion. PFT's were reveiwed and reported at severe obstruction however re-reviewd by pulomonolgy MD and thought to be more of  moderate diffusion impairment with some mild trapping. It was suspected that the restriction and diffusion impairment was related to his chronic HF. He was started on Breo. High resolution CT scan was completed which identified lesions in the liver that could not be quantified as benign or malignant in description and MRI was  recommended.   He is due for colonoscopy 08/31/2018 and needs to be cleared tor procedure. He denies chest pain, DOE or edema. He is adhering to a low sodium diet and medically compliant. He has not need to tale prn metolazone.   Past Medical History:  Diagnosis Date  . 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 Arthritis    "maybe in his spine" (09/05/2016)  . Asthma    pt. denies  . Cardiac arrest ([place]) 08/27/2016   REQUIRING CPR, SHOCK & MEDICATIONS  . CHF (congestive heart failure) ([place])   . Coronary artery disease    90% ostRCA stenosis and total occlusion of dRCA with L-->R collaterals 2017  . Dysrhythmia   . Gout   . High cholesterol   . Hypertension   . Ischemic cardiomyopathy   . LBBB (left bundle branch block)   . OSA on CPAP   . Type II diabetes mellitus ([place])     Past Surgical History:  Procedure Laterality Date  . CARDIAC CATHETERIZATION N/A 09/07/2016   Procedure: Left Heart Cath and Coronary Angiography;  Surgeon: Peter M Martinique, MD;  Location: [facility];  Service: Cardiovascular;  Laterality: N/A;  . EP IMPLANTABLE DEVICE N/A 10/10/2016   Procedure: ICD Implant;  Surgeon: Will Meredith Leeds, MD;  Location: [facility];  Service: Cardiovascular;  Laterality: N/A;  . LUMBAR LAMINECTOMY/DECOMPRESSION MICRODISCECTOMY N/A 01/18/2018   Procedure: L2-3, L3-4, L-4-5 CENTRAL DECOMPRESSION;  Surgeon: Rodell Perna  C, MD;  Location: [facility];  Service: Orthopedics;  Laterality: N/A;     Current Outpatient Medications  Medication Sig Dispense Refill  . allopurinol (ZYLOPRIM) 300 MG tablet Take 300 mg by mouth daily.    [name] amiodarone (PACERONE) 200 MG tablet TAKE 1 TABLET BY MOUTH EVERY DAY 30 tablet 7  . aspirin EC 81 MG EC tablet Take 1 tablet (81 mg total) by mouth daily.    [name] atorvastatin (LIPITOR) 40 MG tablet Take 1 tablet (40 mg total) by mouth daily at 6 PM. 30 tablet 5  . carvedilol (COREG) 25 MG tablet Take 1 tablet (25 mg total)  by mouth 2 (two) times daily with a meal. 60 tablet 7  . CVS VITAMIN D3 1000 units capsule Take 1,000 Units by mouth daily.  5  . fluticasone furoate-vilanterol (BREO ELLIPTA) 200-25 MCG/INH AEPB Inhale 1 puff into the lungs daily. 28 each 5  . furosemide (LASIX) 40 MG tablet Take 1.5 tablets (60 mg total) by mouth 2 (two) times daily. 45 tablet 0  . magnesium oxide (MAG-OX) 400 MG tablet Take 200 mg by mouth 2 (two) times daily.    . metolazone (ZAROXOLYN) 2.5 MG tablet Take 1 tablet (2.5 mg total) by mouth daily. (Patient taking differently: Take 2.5 mg by mouth daily as needed (swelling). ) 5 tablet 0  . pantoprazole (PROTONIX) 40 MG tablet Take 1 tablet (40 mg total) by mouth daily. 30 tablet 5  . sacubitril-valsartan (ENTRESTO) 49-51 MG Take 1 tablet by mouth 2 (two) times daily. 60 tablet 5   No current facility-administered medications for this visit.     Allergies:   Contrast media [iodinated diagnostic agents] and Lisinopril    Social History:  The patient  reports that he has never smoked. He has never used smokeless tobacco. He reports that he does not drink alcohol or use drugs.   Family History:  The patient's family history includes Cancer in his brother; Heart attack in his father; Hypertension in his sister and sister.    ROS: All other systems are reviewed and negative. Unless otherwise mentioned in H&P    PHYSICAL EXAM: VS:  BP 132/66   Pulse 77   Ht 5\' 6"  (1.676 m)   Wt 251 lb 6.4 oz (114 kg)   BMI 40.58 kg/m  , BMI Body mass index is 40.58 kg/m. GEN: Well nourished, well developed, in no acute distress, malodorous body scent.  HEENT: normal Neck: no JVD, carotid bruits, or masses Cardiac: RRR; no murmurs, rubs, or gallops,no edema  Respiratory:  Clear to auscultation bilaterally, normal work of breathing GI: soft, nontender, nondistended, + BS. MS: no deformity or atrophy Skin: warm and dry, no rash Neuro:  Strength and sensation are intact Psych: euthymic  mood, full affect   EKG:  Sinus rhythm with 1st degree AV block.  Rate of 77 bpm. LBBB pattern.   Recent Labs: 05/15/2018: B Natriuretic Peptide 679.6 05/16/2018: Magnesium 1.7 05/18/2018: ALT 23 07/24/2018: Hemoglobin 11.9; Platelets 249.0 08/09/2018: BUN 30; Creatinine, Ser 2.02; Potassium 4.6; Sodium 140    Lipid Panel    Component Value Date/Time   CHOL 159 09/07/2016 0844   TRIG 146 09/07/2016 0844   HDL 25 (L) 09/07/2016 0844   CHOLHDL 6.4 09/07/2016 0844   VLDL 29 09/07/2016 0844   LDLCALC 105 (H) 09/07/2016 0844      Wt Readings from Last 3 Encounters:  08/28/18 251 lb 6.4 oz (114 kg)  07/26/18 255 lb 12.8 oz (  116 kg)  07/24/18 260 lb 12.8 oz (118.3 kg)      Other studies Reviewed: Echo 05/16/18: Study Conclusions - Left ventricle: The cavity size was normal. Wall thickness was increased in a pattern of moderate LVH. Systolic function was moderately to severely reduced. The estimated ejection fraction was in the range of 30% to 35%. No mural thrombus by Definity contrast. Diffuse hypokinesis. Doppler parameters are consistent with restrictive left ventricular relaxation (grade 3 diastolic dysfunction). The E/e&' ratio is >20, suggesting markedly elevated LV filling pressure. - Aortic valve: Sclerosis without stenosis. There was trivial regurgitation. - Mitral valve: Mildly thickened leaflets . There was moderate regurgitation. - Left atrium: Severely dilated. - Right ventricle: The cavity size was mildly dilated. AICD noted in right ventricle. - Right atrium: The atrium was mildly dilated. AICD noted in right atrium. - Tricuspid valve: There was moderate regurgitation. - Pulmonary arteries: PA peak pressure: 52 mm Hg (S). - Inferior vena cava: The vessel was normal in size. The respirophasic diameter changes were in the normal range (= 50%), consistent with normal central venous pressure.  LHC 09/07/2016:  There is moderate  left ventricular systolic dysfunction.  LV end diastolic pressure is normal.  The left ventricular ejection fraction is 35-45% by visual estimate.  Ost RCA lesion, 90 %stenosed.  Mid RCA to Dist RCA lesion, 100 %stenosed.  1. Single vessel occlusive CAD. The distal RCA is occluded with left to right collaterals. There is a 90% ostial stenosis 2. Moderate LV dysfunction with EF 35-40%. Akinesis of the basal to mid inferior wall 3. Normal LVEDP  Plan: medical therapy. If he has significant angina we could consider PCI of the RCA but there appears to be significant scar of the inferior wall.  ASSESSMENT AND PLAN:  1. ICM: Most recent EF per echo 30%-35%. He has ICD in situ. He denies chest pain or dyspnea. Continue current medical management with carvedilol 25 mg BID, and Entresto. No spironolactone as due to CKD   2. CAD: Total occlusion of the RCA per cath in 2017. No recurrence of angina. Mo further ischemic  testing at this time.   3. Hypertension: BP is not optimal for current LV function. Consider adding hydralazine on next office visit to keep BP < 697 systolic.   4. Pre-Cardiac Clearance;  Chart reviewed as part of pre-operative protocol coverage. Given past medical history and time since last visit, based on ACC/AHA guidelines, Cali Cuartas Sr. would be at acceptable risk for the colonoscopy procedure without further cardiovascular testing or changes in medication regimen.    Current medicines are reviewed at length with the patient today.    Labs/ tests ordered today include: None  Phill Myron. West Pugh, ANP, AACC   08/28/2018 11:09 AM    [address] Office [phone] Fax [phone]

## 2018-08-28 ENCOUNTER — Ambulatory Visit: Payer: Medicare HMO | Admitting: Adult Health

## 2018-08-28 ENCOUNTER — Encounter: Payer: Self-pay | Admitting: Adult Health

## 2018-08-28 VITALS — BP 132/66 | HR 77 | Ht 66.0 in | Wt 251.4 lb

## 2018-08-28 DIAGNOSIS — I10 Essential (primary) hypertension: Secondary | ICD-10-CM

## 2018-08-28 DIAGNOSIS — Z01810 Encounter for preprocedural cardiovascular examination: Secondary | ICD-10-CM | POA: Diagnosis not present

## 2018-08-28 DIAGNOSIS — I2510 Atherosclerotic heart disease of native coronary artery without angina pectoris: Secondary | ICD-10-CM | POA: Diagnosis not present

## 2018-08-28 DIAGNOSIS — I255 Ischemic cardiomyopathy: Secondary | ICD-10-CM

## 2018-08-28 NOTE — Patient Instructions (Signed)
Follow-Up: You will need a follow up appointment in 6 MONTHS.  Please call our office 2 months in advance(JAN 2020) to schedule the (June 2020) appointment.  You may see  DR Curt Bears, Jory Sims, DNP, [place] -or- one of the Advanced Practice Providers on your designated Care Team. Medication Instructions:  NO CHANGES- Your physician recommends that you continue on your current medications as directed. Please refer to the Current Medication list given to you today. If you need a refill on your cardiac medications before your next appointment, please call your pharmacy. Labwork: If you have labs (blood work) drawn today and your tests are completely normal, you will receive your results ONLY by: . MyChart Message (if you have MyChart) -OR- . A paper copy in the mail At [facility], you and your health needs are our priority.  As part of our continuing mission to provide you with exceptional heart care, we have created designated Provider Care Teams.  These Care Teams include your primary Cardiologist (physician) and Advanced Practice Providers (APPs -  Physician Assistants and Nurse Practitioners) who all work together to provide you with the care you need, when you need it.  Thank you for choosing [facility] at [facility]!!

## 2018-08-29 ENCOUNTER — Ambulatory Visit: Payer: Medicare HMO | Admitting: Pulmonary Disease

## 2018-08-29 ENCOUNTER — Encounter (HOSPITAL_COMMUNITY): Payer: Self-pay | Admitting: *Deleted

## 2018-08-29 NOTE — Anesthesia Preprocedure Evaluation (Addendum)
Anesthesia Evaluation  Patient identified by MRN, date of birth, ID band Patient awake    Reviewed: Allergy & Precautions, NPO status , Patient's Chart, lab work & pertinent test results  History of Anesthesia Complications Negative for: history of anesthetic complications  Airway Mallampati: II  TM Distance: >3 FB Neck ROM: Full    Dental  (+) Teeth Intact, Dental Advisory Given   Pulmonary asthma , sleep apnea ,    Pulmonary exam normal breath sounds clear to auscultation       Cardiovascular hypertension, + CAD and +CHF  Normal cardiovascular exam+ Cardiac Defibrillator  Rhythm:Regular Rate:Normal  TTE 04/2018: moderate LVH, EF 15-61%, grade 3 diastolic   Dysfunction, moderate MR/TR, severe LAE, mild RAE, AICD, PASP 52 mmHg   LBBB   Neuro/Psych negative neurological ROS     GI/Hepatic negative GI ROS, Neg liver ROS,   Endo/Other  diabetes, Type 2  Renal/GU Renal Insufficiency and CRFRenal disease     Musculoskeletal  (+) Arthritis , Osteoarthritis,    Abdominal   Peds  Hematology  (+) anemia ,   Anesthesia Other Findings Day of surgery medications reviewed with the patient.  Reproductive/Obstetrics                            Anesthesia Physical Anesthesia Plan  ASA: III  Anesthesia Plan: MAC   Post-op Pain Management:    Induction:   PONV Risk Score and Plan: 1 and Treatment may vary due to age or medical condition and Propofol infusion  Airway Management Planned: Natural Airway and Nasal Cannula  Additional Equipment:   Intra-op Plan:   Post-operative Plan:   Informed Consent: I have reviewed the patients History and Physical, chart, labs and discussed the procedure including the risks, benefits and alternatives for the proposed anesthesia with the patient or authorized representative who has indicated his/her understanding and acceptance.   Dental advisory  given  Plan Discussed with: CRNA  Anesthesia Plan Comments:        Anesthesia Quick Evaluation

## 2018-08-30 ENCOUNTER — Ambulatory Visit (HOSPITAL_COMMUNITY): Payer: Medicare HMO | Admitting: Anesthesiology

## 2018-08-30 ENCOUNTER — Encounter (HOSPITAL_COMMUNITY)
Admission: RE | Disposition: A | Discharge status: Home / Self Care | Payer: Self-pay | Source: Ambulatory Visit | Attending: Gastroenterology

## 2018-08-30 ENCOUNTER — Encounter (HOSPITAL_COMMUNITY): Payer: Self-pay | Admitting: *Deleted

## 2018-08-30 ENCOUNTER — Other Ambulatory Visit: Payer: Self-pay

## 2018-08-30 ENCOUNTER — Ambulatory Visit (HOSPITAL_COMMUNITY)
Admission: RE | Admit: 2018-08-30 | Discharge: 2018-08-30 | Disposition: A | Discharge status: Home / Self Care | Payer: Medicare HMO | Source: Ambulatory Visit | Attending: Gastroenterology | Admitting: Gastroenterology

## 2018-08-30 DIAGNOSIS — I509 Heart failure, unspecified: Secondary | ICD-10-CM | POA: Diagnosis not present

## 2018-08-30 DIAGNOSIS — Z8674 Personal history of sudden cardiac arrest: Secondary | ICD-10-CM | POA: Insufficient documentation

## 2018-08-30 DIAGNOSIS — I2510 Atherosclerotic heart disease of native coronary artery without angina pectoris: Secondary | ICD-10-CM | POA: Diagnosis not present

## 2018-08-30 DIAGNOSIS — K635 Polyp of colon: Secondary | ICD-10-CM | POA: Diagnosis not present

## 2018-08-30 DIAGNOSIS — I110 Hypertensive heart disease with heart failure: Secondary | ICD-10-CM | POA: Diagnosis not present

## 2018-08-30 DIAGNOSIS — Z86010 Personal history of colonic polyps: Secondary | ICD-10-CM | POA: Diagnosis not present

## 2018-08-30 DIAGNOSIS — G4733 Obstructive sleep apnea (adult) (pediatric): Secondary | ICD-10-CM | POA: Insufficient documentation

## 2018-08-30 DIAGNOSIS — Z95810 Presence of automatic (implantable) cardiac defibrillator: Secondary | ICD-10-CM | POA: Diagnosis not present

## 2018-08-30 DIAGNOSIS — E119 Type 2 diabetes mellitus without complications: Secondary | ICD-10-CM | POA: Insufficient documentation

## 2018-08-30 DIAGNOSIS — Z09 Encounter for follow-up examination after completed treatment for conditions other than malignant neoplasm: Secondary | ICD-10-CM | POA: Diagnosis present

## 2018-08-30 HISTORY — PX: COLONOSCOPY WITH PROPOFOL: SHX5780

## 2018-08-30 HISTORY — PX: POLYPECTOMY: SHX5525

## 2018-08-30 LAB — GLUCOSE, CAPILLARY: Glucose-Capillary: 90 mg/dL (ref 70–99)

## 2018-08-30 SURGERY — COLONOSCOPY WITH PROPOFOL
Anesthesia: Monitor Anesthesia Care

## 2018-08-30 MED ORDER — PROPOFOL 10 MG/ML IV BOLUS
INTRAVENOUS | Status: DC | PRN
  Administered 2018-08-30: 40 mg via INTRAVENOUS

## 2018-08-30 MED ORDER — SODIUM CHLORIDE 0.9 % IV SOLN: INTRAVENOUS | Status: DC

## 2018-08-30 MED ORDER — PROPOFOL 10 MG/ML IV BOLUS
INTRAVENOUS | Status: AC
  Filled 2018-08-30: qty 60

## 2018-08-30 MED ORDER — LACTATED RINGERS IV SOLN
INTRAVENOUS | Status: DC
  Administered 2018-08-30: 1000 mL via INTRAVENOUS

## 2018-08-30 MED ORDER — PHENYLEPHRINE 40 MCG/ML (10ML) SYRINGE FOR IV PUSH (FOR BLOOD PRESSURE SUPPORT)
PREFILLED_SYRINGE | INTRAVENOUS | Status: DC | PRN
  Administered 2018-08-30 (×2): 80 ug via INTRAVENOUS

## 2018-08-30 MED ORDER — PROPOFOL 500 MG/50ML IV EMUL
INTRAVENOUS | Status: DC | PRN
  Administered 2018-08-30: 125 ug/kg/min via INTRAVENOUS

## 2018-08-30 SURGICAL SUPPLY — 22 items

## 2018-08-30 NOTE — Discharge Instructions (Signed)

## 2018-08-30 NOTE — H&P (Signed)
  Dean Bones Sr. HPI: His colonoscopy on 07/13/2015 was positive for 5 adenomas. The patient denies any problems with chest pain, SOB, MI, diarrhea, constipation, melena, hematochezia, abdominal pain, or any development of colon cancer in his family. He is s/p defibrillator placement in 2019 as he had a syncopal episode. Recently he was in the hospital for CHF.   Past Medical History:  Diagnosis Date  . 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 Arthritis    "maybe in his spine" (09/05/2016)  . Asthma    pt. denies  . Cardiac arrest ([place]) 08/27/2016   REQUIRING CPR, SHOCK & MEDICATIONS  . CHF (congestive heart failure) ([place])   . Coronary artery disease    90% ostRCA stenosis and total occlusion of dRCA with L-->R collaterals 2017  . Dysrhythmia   . Gout   . High cholesterol   . Hypertension   . Ischemic cardiomyopathy   . LBBB (left bundle branch block)   . OSA on CPAP   . Type II diabetes mellitus ([place])     Past Surgical History:  Procedure Laterality Date  . CARDIAC CATHETERIZATION N/A 09/07/2016   Procedure: Left Heart Cath and Coronary Angiography;  Surgeon: Peter M Martinique, MD;  Location: [facility];  Service: Cardiovascular;  Laterality: N/A;  . EP IMPLANTABLE DEVICE N/A 10/10/2016   Procedure: ICD Implant;  Surgeon: Will Meredith Leeds, MD;  Location: [facility];  Service: Cardiovascular;  Laterality: N/A;  . LUMBAR LAMINECTOMY/DECOMPRESSION MICRODISCECTOMY N/A 01/18/2018   Procedure: L2-3, L3-4, L-4-5 CENTRAL DECOMPRESSION;  Surgeon: [name], MD;  Location: [facility];  Service: Orthopedics;  Laterality: N/A;    Family History  Problem Relation Age of Onset  . Heart attack Father        died in his 76's  . Hypertension Sister   . Cancer Brother        uncertain, "leg"  . Hypertension Sister     Social History:  reports that he has never smoked. He has never used smokeless tobacco. He reports that he does not drink  alcohol or use drugs.  Allergies:  Allergies  Allergen Reactions  . Contrast Media [Iodinated Diagnostic Agents] Shortness Of Breath and Nausea And Vomiting  . Lisinopril Cough    Medications:  Scheduled:  Continuous: . sodium chloride    . lactated ringers 1,000 mL (08/30/18 0713)    Results for orders placed or performed during the hospital encounter of 08/30/18 (from the past 24 hour(s))  Glucose, capillary     Status: None   Collection Time: 08/30/18  7:07 AM  Result Value Ref Range   Glucose-Capillary 90 70 - 99 mg/dL     No results found.  ROS:  As stated above in the HPI otherwise negative.  Blood pressure (!) 185/39, pulse 71, temperature 98.1 F (36.7 C), temperature source Oral, resp. rate 15, height 5\' 6"  (1.676 m), weight 114 kg, SpO2 96 %.    PE: Gen: NAD, Alert and Oriented HEENT:  [place]/AT, EOMI Neck: Supple, no LAD Lungs: CTA Bilaterally CV: RRR without M/G/R ABM: Soft, NTND, +BS Ext: No C/C/E  Assessment/Plan: 1) Personal history of polyps - colonoscopy.  Dean [name] 08/30/2018, 7:32 AM

## 2018-08-30 NOTE — Transfer of Care (Signed)
Immediate Anesthesia Transfer of Care Note  Patient: Dean Mohamed Sr.  Procedure(s) Performed: COLONOSCOPY WITH PROPOFOL (N/A ) POLYPECTOMY  Patient Location: PACU and Endoscopy Unit  Anesthesia Type:MAC  Level of Consciousness: awake and drowsy  Airway & Oxygen Therapy: Patient Spontanous Breathing  Post-op Assessment: Report given to RN and Post -op Vital signs reviewed and stable  Post vital signs: Reviewed and stable  Last Vitals:  Vitals Value Taken Time  BP    Temp    Pulse 69 08/30/2018  8:08 AM  Resp 13 08/30/2018  8:08 AM  SpO2 100 % 08/30/2018  8:08 AM  Vitals shown include unvalidated device data.  Last Pain:  Vitals:   08/30/18 0643  TempSrc: Oral  PainSc: 0-No pain         Complications: No apparent anesthesia complications

## 2018-08-30 NOTE — Op Note (Signed)
[facility] Patient Name: Dean [name] Procedure Date: 08/30/2018 MRN: [id number] Attending MD: Carol Ada , MD Date of Birth: 1951/02/23 CSN: [id number] Age: 67 Admit Type: Outpatient Procedure:                Colonoscopy Indications:              High risk colon cancer surveillance: Personal                            history of colonic polyps Providers:                Carol Ada, MD, Cleda Daub, RN, William Dalton, Technician Referring MD:              Medicines:                Propofol per Anesthesia Complications:            No immediate complications. Estimated Blood Loss:     Estimated blood loss: none. Procedure:                Pre-Anesthesia Assessment:                           - Prior to the procedure, a History and Physical                            was performed, and patient medications and                            allergies were reviewed. The patient's tolerance of                            previous anesthesia was also reviewed. The risks                            and benefits of the procedure and the sedation                            options and risks were discussed with the patient.                            All questions were answered, and informed consent                            was obtained. Prior Anticoagulants: The patient has                            taken no previous anticoagulant or antiplatelet                            agents. ASA Grade Assessment: III - A patient with                            severe systemic  disease. After reviewing the risks                            and benefits, the patient was deemed in                            satisfactory condition to undergo the procedure.                           - Sedation was administered by an anesthesia                            professional. Deep sedation was attained.                           After obtaining informed consent, the  colonoscope                            was passed under direct vision. Throughout the                            procedure, the patient's blood pressure, pulse, and                            oxygen saturations were monitored continuously. The                            CF-HQ190L ([id number] Olympus adult colonoscope was                            introduced through the anus and advanced to the the                            cecum, identified by appendiceal orifice and                            ileocecal valve. The colonoscopy was performed                            without difficulty. The patient tolerated the                            procedure well. The quality of the bowel                            preparation was good. The ileocecal valve,                            appendiceal orifice, and rectum were photographed. Scope In: 7:42:49 AM Scope Out: 8:01:41 AM Scope Withdrawal Time: 0 hours 16 minutes 16 seconds  Total Procedure Duration: 0 hours 18 minutes 52 seconds  Findings:      A 3 mm polyp was found in the appendiceal orifice. The polyp was       sessile. The polyp was removed with a cold  snare. Resection and       retrieval were complete.      Scattered small and large-mouthed diverticula were found in the sigmoid       colon and descending colon. Impression:               - One 3 mm polyp at the appendiceal orifice,                            removed with a cold snare. Resected and retrieved.                           - Diverticulosis in the sigmoid colon and in the                            descending colon. Moderate Sedation:      Not Applicable - Patient had care per Anesthesia. Recommendation:           - Patient has a contact number available for                            emergencies. The signs and symptoms of potential                            delayed complications were discussed with the                            patient. Return to normal activities tomorrow.                             Written discharge instructions were provided to the                            patient.                           - Resume previous diet.                           - Continue present medications.                           - Await pathology results.                           - Repeat colonoscopy in 5 years for surveillance. 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 All rights reserved. The codes documented in this report are preliminary and upon coder review may  be revised to meet current compliance requirements. Carol Ada, MD Carol Ada, MD 08/30/2018 8:09:17 AM This report has been signed electronically. Number of Addenda: 0

## 2018-08-30 NOTE — Anesthesia Postprocedure Evaluation (Signed)
Anesthesia Post Note  Patient: Dean Poche Sr.  Procedure(s) Performed: COLONOSCOPY WITH PROPOFOL (N/A ) POLYPECTOMY     Patient location during evaluation: PACU Anesthesia Type: MAC Level of consciousness: awake and alert Pain management: pain level controlled Vital Signs Assessment: post-procedure vital signs reviewed and stable Respiratory status: spontaneous breathing, nonlabored ventilation and respiratory function stable Cardiovascular status: blood pressure returned to baseline and stable Postop Assessment: no apparent nausea or vomiting Anesthetic complications: no    Last Vitals:  Vitals:   08/30/18 0700 08/30/18 0808  BP: (!) 185/39   Pulse:  69  Resp:  13  Temp:  36.6 C  SpO2:  100%    Last Pain:  Vitals:   08/30/18 0808  TempSrc: Oral  PainSc: 0-No pain                 Brennan Bailey

## 2018-08-31 ENCOUNTER — Other Ambulatory Visit: Payer: Self-pay | Admitting: Adult Health

## 2018-09-02 ENCOUNTER — Encounter (HOSPITAL_COMMUNITY): Payer: Self-pay | Admitting: Gastroenterology

## 2018-09-02 ENCOUNTER — Ambulatory Visit: Payer: Medicare HMO | Admitting: Adult Health

## 2018-09-02 NOTE — Telephone Encounter (Signed)
Rx request sent to pharmacy.  

## 2018-09-06 ENCOUNTER — Ambulatory Visit: Payer: Medicare HMO | Admitting: Pulmonary Disease

## 2018-09-06 ENCOUNTER — Encounter: Payer: Self-pay | Admitting: Pulmonary Disease

## 2018-09-06 VITALS — BP 124/76 | HR 86 | Ht 66.0 in | Wt 246.2 lb

## 2018-09-06 DIAGNOSIS — J849 Interstitial pulmonary disease, unspecified: Secondary | ICD-10-CM | POA: Diagnosis not present

## 2018-09-06 DIAGNOSIS — R059 Cough, unspecified: Secondary | ICD-10-CM

## 2018-09-06 DIAGNOSIS — R0602 Shortness of breath: Secondary | ICD-10-CM

## 2018-09-06 DIAGNOSIS — R05 Cough: Secondary | ICD-10-CM | POA: Diagnosis not present

## 2018-09-06 NOTE — Patient Instructions (Signed)
We will repeat a high-resolution CT in 1 year Continue Breo inhaler Schedule you for MRI liver.  Follow up in 6 months.

## 2018-09-06 NOTE — Progress Notes (Signed)
Dean Birkeland Sr.    [id number]    08/09/1951  Primary Care Physician:Moreira, Carloyn Manner, MD  Referring Physician: [name], MD [id number], [place] [id number]  Chief complaint: Follow up for abnormal PFTs  HPI: 67 y/o HTN, chronic combined systolic and diastolic heart failure, ischenic cardiomyopathy, EF of 35%-40% with akinesis and mid basal inferior and apical myocardium with Grade 2 diastolic dysfunction, CAD with CTO of RCA, NSVT, and CKD stage III.VT/V. fib status post ICD, chronic systolic heart failure,hyperlipidemia, hypertension, diabetes, and OSA on CPAP.   He is being monitored while on amiodarone therapy.  He had PFTs this month which showed restriction and diffusion impairment and has been referred here for further evaluation. He has complains of nonproductive, dyspnea on exertion.  Pets: No pets Occupation: Used to work in [employer] Exposures: Has remote exposure to asbestos and mold Smoking history: Never smoker Travel history: No significant travel history Relevant family history: No significant history of lung disease.   Interim history: States that his breathing is doing well.  Cough is resolved.  He has mild dyspnea on exertion. Here for review of his CT scan.  Outpatient Encounter Medications as of 09/06/2018  Medication Sig  . allopurinol (ZYLOPRIM) 300 MG tablet Take 300 mg by mouth daily.  [name] amiodarone (PACERONE) 200 MG tablet TAKE 1 TABLET BY MOUTH EVERY DAY  . aspirin EC 81 MG EC tablet Take 1 tablet (81 mg total) by mouth daily.  [name] atorvastatin (LIPITOR) 40 MG tablet Take 1 tablet (40 mg total) by mouth daily at 6 PM.  . carvedilol (COREG) 25 MG tablet Take 1 tablet (25 mg total) by mouth 2 (two) times daily with a meal.  . CVS VITAMIN D3 1000 units capsule Take 1,000 Units by mouth daily.  . fluticasone furoate-vilanterol (BREO ELLIPTA) 200-25 MCG/INH AEPB Inhale 1 puff into the lungs daily.  . furosemide (LASIX) 40 MG tablet  TAKE 1.5 TABLETS (60 MG TOTAL) BY MOUTH 2 (TWO) TIMES DAILY.  . magnesium oxide (MAG-OX) 400 MG tablet Take 200 mg by mouth 2 (two) times daily.  . metolazone (ZAROXOLYN) 2.5 MG tablet Take 1 tablet (2.5 mg total) by mouth daily. (Patient taking differently: Take 2.5 mg by mouth daily as needed (swelling). )  . pantoprazole (PROTONIX) 40 MG tablet Take 1 tablet (40 mg total) by mouth daily.  . sacubitril-valsartan (ENTRESTO) 49-51 MG Take 1 tablet by mouth 2 (two) times daily.   No facility-administered encounter medications on file as of 09/06/2018.    Physical Exam: Blood pressure 124/76, pulse 86, height 5\' 6"  (1.676 m), weight 246 lb 3.2 oz (111.7 kg), SpO2 98 %. Gen:      No acute distress HEENT:  EOMI, sclera anicteric Neck:     No masses; no thyromegaly Lungs:    Clear to auscultation bilaterally; normal respiratory effort CV:         Regular rate and rhythm; no murmurs Abd:      + bowel sounds; soft, non-tender; no palpable masses, no distension Ext:    No edema; adequate peripheral perfusion Skin:      Warm and dry; no rash Neuro: alert and oriented x 3 Psych: normal mood and affect  Data Reviewed: Imaging: CT chest 08/27/2016-no pulmonary embolism, basilar pulmonary collapse/consolidation, aortic atherosclerosis Chest x-ray 05/15/2018-cardiomegaly, pulmonary vascular congestion.  AICD in place High-resolution CT chest 08/12/2018-Very mild groundglass attenuation with septal thickening bilaterally.  Left main and three-vessel coronary artery disease.  Calcification of the aortic wall.  Multiple lesions noted in the liver. I reviewed the images personally.  PFTs: 07/11/2018 FVC 1.68 (51%), FEV1 1.31 [50%), F/F 78, TLC 72%, RV/TLC 173%, DLCO 52%, DLCO/VA 111% Mild restriction with moderate diffusion impairment which corrects for alveolar volume Air-trapping.  FENO 07/24/1989-24  Labs: CBC 07/24/2018-WBC 8.5, eos 3.5%, absolute eosinophil count 297 IgE 07/24/2018-  941.  Assessment:  Abnormal PFTs Reviewed PFTs which show mostly mild restriction with moderate diffusion impairment.  There is a read of severe obstruction but on my review this is minimal.  He does have some mild trapping and improvement in flow rates post albuterol which may indicate minimal reactive airway disease.  Continue on Breo inhaler as he appears to have responded symptomatically.  Suspect that the restriction and diffusion impairment may be related to his heart failure CT reviewed with minimal changes, no clear evidence of interstitial lung disease or amiodarone toxicity There is subtle changes which could be coming from volume overload.  Repeat high-res CT in 1 year for follow-up.  Liver lesions CT also shows multiple liver lesions. History noted for MRI liver in 2004 showing benign hemangiomas. We will repeat the MRI for follow-up.  Plan/Recommendations: - Continue Breo - Repeat high-res CT in 1 year - MRI liver  Marshell Garfinkel MD [facility] 09/06/2018, 11:24 AM  CC: [name], MD

## 2018-09-19 ENCOUNTER — Ambulatory Visit (HOSPITAL_COMMUNITY)
Admission: RE | Admit: 2018-09-19 | Discharge: 2018-09-19 | Disposition: A | Discharge status: Home / Self Care | Payer: Medicare HMO | Source: Ambulatory Visit | Attending: Pulmonary Disease | Admitting: Pulmonary Disease

## 2018-09-19 DIAGNOSIS — R160 Hepatomegaly, not elsewhere classified: Secondary | ICD-10-CM | POA: Diagnosis not present

## 2018-09-19 DIAGNOSIS — D1803 Hemangioma of intra-abdominal structures: Secondary | ICD-10-CM | POA: Diagnosis not present

## 2018-09-19 DIAGNOSIS — K5730 Diverticulosis of large intestine without perforation or abscess without bleeding: Secondary | ICD-10-CM | POA: Insufficient documentation

## 2018-09-19 DIAGNOSIS — K769 Liver disease, unspecified: Secondary | ICD-10-CM | POA: Insufficient documentation

## 2018-09-19 LAB — POCT I-STAT CREATININE: Creatinine, Ser: 1.6 mg/dL — ABNORMAL HIGH (ref 0.61–1.24)

## 2018-09-19 MED ORDER — GADOBUTROL 1 MMOL/ML IV SOLN
10.0000 mL | Freq: Once | INTRAVENOUS | Status: AC | PRN
  Administered 2018-09-19: 10 mL via INTRAVENOUS

## 2018-09-23 ENCOUNTER — Telehealth: Payer: Self-pay | Admitting: Cardiology

## 2018-09-23 NOTE — Telephone Encounter (Signed)
Reports "getting some heartbeating that is strong", "its like I can hear it".  "But I dont feel anything from it". Reports taking BP and it is normal. Denies CP, SOB, dizziness, syncope.  Reports no symptoms with concern, only "hearing heartbeat". Advised to send in manual transmission before determination can be made.  Pt agreeable to sending transmission today and understands it may be tomorrow before return call so that Dr. Curt Bears may review transmission, if needed.

## 2018-09-23 NOTE — Telephone Encounter (Signed)
Pt called I instructed him on how to send a manual transmission. I told him that the nurse and Dr. Would take a look at it and give him a call if not today, it will be tomorrow.

## 2018-09-23 NOTE — Telephone Encounter (Signed)
° °  Patient has questions about "heavy heartbeat" Patient wants to know what he should consider normal for him

## 2018-09-24 ENCOUNTER — Telehealth: Payer: Self-pay | Admitting: Cardiology

## 2018-09-24 NOTE — Telephone Encounter (Signed)
Advised pt nothing seen on transmission.  Pt asked if this may be coming from anxiety -- informed that it may.  He will continue to monitor and we will discuss more at [facility] next week. Pt agreeable to plan.

## 2018-09-24 NOTE — Telephone Encounter (Signed)
Patient calling back and requesting to be called back about his remote transmission from 09-23-2018. Remote transmission was received.

## 2018-09-24 NOTE — Telephone Encounter (Signed)
Follow up   Patient would like to speak with nurse about his transmission

## 2018-09-24 NOTE — Telephone Encounter (Signed)
Opened in error

## 2018-09-27 ENCOUNTER — Telehealth: Payer: Self-pay

## 2018-09-27 NOTE — Telephone Encounter (Signed)
Referred to [facility] clinic by Dr Curt Bears.   Attempted call to patient for ICM intro and no answer or answering machine.

## 2018-10-02 ENCOUNTER — Ambulatory Visit (INDEPENDENT_AMBULATORY_CARE_PROVIDER_SITE_OTHER): Payer: Medicare HMO

## 2018-10-02 DIAGNOSIS — I5042 Chronic combined systolic (congestive) and diastolic (congestive) heart failure: Secondary | ICD-10-CM

## 2018-10-02 DIAGNOSIS — I255 Ischemic cardiomyopathy: Secondary | ICD-10-CM

## 2018-10-03 ENCOUNTER — Encounter: Payer: Self-pay | Admitting: Cardiology

## 2018-10-03 ENCOUNTER — Ambulatory Visit: Payer: Medicare HMO | Admitting: Cardiology

## 2018-10-03 VITALS — BP 122/68 | HR 63 | Ht 66.0 in | Wt 252.0 lb

## 2018-10-03 DIAGNOSIS — I472 Ventricular tachycardia, unspecified: Secondary | ICD-10-CM

## 2018-10-03 DIAGNOSIS — I255 Ischemic cardiomyopathy: Secondary | ICD-10-CM

## 2018-10-03 DIAGNOSIS — I2510 Atherosclerotic heart disease of native coronary artery without angina pectoris: Secondary | ICD-10-CM

## 2018-10-03 DIAGNOSIS — I10 Essential (primary) hypertension: Secondary | ICD-10-CM | POA: Diagnosis not present

## 2018-10-03 DIAGNOSIS — I480 Paroxysmal atrial fibrillation: Secondary | ICD-10-CM

## 2018-10-03 LAB — CUP PACEART REMOTE DEVICE CHECK
Battery Remaining Longevity: 126 mo
Battery Voltage: 3.01 V
Brady Statistic RV Percent Paced: 0.49 %
Date Time Interrogation Session: 20200108102824
HighPow Impedance: 66 Ohm
Implantable Lead Implant Date: 20180116
Implantable Lead Location: 753860
Implantable Lead Model: 6935
Implantable Pulse Generator Implant Date: 20180116
Lead Channel Impedance Value: 266 Ohm
Lead Channel Impedance Value: 342 Ohm
Lead Channel Pacing Threshold Amplitude: 0.75 V
Lead Channel Pacing Threshold Pulse Width: 0.4 ms
Lead Channel Sensing Intrinsic Amplitude: 12.625 mV
Lead Channel Sensing Intrinsic Amplitude: 12.625 mV
Lead Channel Setting Pacing Amplitude: 2.5 V
Lead Channel Setting Pacing Pulse Width: 0.4 ms
Lead Channel Setting Sensing Sensitivity: 0.3 mV

## 2018-10-03 MED ORDER — APIXABAN 5 MG PO TABS: 5.0000 mg | ORAL_TABLET | Freq: Two times a day (BID) | ORAL | 3 refills | Status: DC

## 2018-10-03 NOTE — Progress Notes (Signed)
Electrophysiology Office Note   Date:  10/03/2018   ID:  Dean Bones Sr., DOB [birth year], MRN [id number]  PCP:  [name], MD Primary Electrophysiologist:  [name], MD    No chief complaint on file.    History of Present Illness: Dean Cartwright Sr. is a 68 y.o. male who presents today for electrophysiology evaluation.   Hx asthma, T2DM, asthma, gout, HTN who was admitted on 08/27/2016 after he collapsed walking back from the bathroom. CPR not performed by family. He was treated with CPR and ALS protocol and he coded again en route to the hospital.  He had episode of NSVT in CT scanner and treated with amiodarone. EKG with inferior T wave inversions and was treated with [employer] protocol and elevated troponin felt to be due to shock applied for VT/VF. Cardiac cath 12/14 revealed 90% ostial stenosis distal R-RCA with left to right collaterals and 100% stenosis mid RCA to distal RCA lesion. ICD implanted 10/10/16.   Today, denies symptoms of palpitations, chest pain, shortness of breath, orthopnea, PND, lower extremity edema, claudication, dizziness, presyncope, syncope, bleeding, or neurologic sequela. The patient is tolerating medications without difficulties.  Atrial fibrillation was noted in his device interrogation.  He is minimally symptomatic without knowledge of arrhythmia.   Past Medical History:  Diagnosis Date  . 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 Arthritis    "maybe in his spine" (09/05/2016)  . Asthma    pt. denies  . Cardiac arrest ([place]) 08/27/2016   REQUIRING CPR, SHOCK & MEDICATIONS  . CHF (congestive heart failure) ([place])   . Coronary artery disease    90% ostRCA stenosis and total occlusion of dRCA with L-->R collaterals 2017  . Dysrhythmia   . Gout   . High cholesterol   . Hypertension   . Ischemic cardiomyopathy   . LBBB (left bundle branch block)   . OSA on CPAP   . Type II diabetes mellitus ([place])    Past  Surgical History:  Procedure Laterality Date  . CARDIAC CATHETERIZATION N/A 09/07/2016   Procedure: Left Heart Cath and Coronary Angiography;  Surgeon: Peter M Martinique, MD;  Location: [facility];  Service: Cardiovascular;  Laterality: N/A;  . COLONOSCOPY WITH PROPOFOL N/A 08/30/2018   Procedure: COLONOSCOPY WITH PROPOFOL;  Surgeon: Carol Ada, MD;  Location: [facility];  Service: Endoscopy;  Laterality: N/A;  . EP IMPLANTABLE DEVICE N/A 10/10/2016   Procedure: ICD Implant;  Surgeon: [name], MD;  Location: [place] [facility];  Service: Cardiovascular;  Laterality: N/A;  . LUMBAR LAMINECTOMY/DECOMPRESSION MICRODISCECTOMY N/A 01/18/2018   Procedure: L2-3, L3-4, L-4-5 CENTRAL DECOMPRESSION;  Surgeon: [name], MD;  Location: [facility];  Service: Orthopedics;  Laterality: N/A;  . POLYPECTOMY  08/30/2018   Procedure: POLYPECTOMY;  Surgeon: Carol Ada, MD;  Location: [facility];  Service: Endoscopy;;     Current Outpatient Medications  Medication Sig Dispense Refill  . allopurinol (ZYLOPRIM) 300 MG tablet Take 300 mg by mouth daily.    [name] amiodarone (PACERONE) 200 MG tablet TAKE 1 TABLET BY MOUTH EVERY DAY 30 tablet 7  . aspirin EC 81 MG EC tablet Take 1 tablet (81 mg total) by mouth daily.    [name] atorvastatin (LIPITOR) 40 MG tablet Take 1 tablet (40 mg total) by mouth daily at 6 PM. 30 tablet 5  . carvedilol (COREG) 25 MG tablet Take 1 tablet (25 mg total) by mouth 2 (two) times daily with a meal. 60 tablet  7  . CVS VITAMIN D3 1000 units capsule Take 1,000 Units by mouth daily.  5  . fluticasone furoate-vilanterol (BREO ELLIPTA) 200-25 MCG/INH AEPB Inhale 1 puff into the lungs daily. 28 each 5  . furosemide (LASIX) 40 MG tablet TAKE 1.5 TABLETS (60 MG TOTAL) BY MOUTH 2 (TWO) TIMES DAILY. 45 tablet 0  . magnesium oxide (MAG-OX) 400 MG tablet Take 200 mg by mouth 2 (two) times daily.    . metolazone (ZAROXOLYN) 2.5 MG tablet Take 1 tablet (2.5 mg total) by mouth daily.  (Patient taking differently: Take 2.5 mg by mouth daily as needed (swelling). ) 5 tablet 0  . pantoprazole (PROTONIX) 40 MG tablet Take 1 tablet (40 mg total) by mouth daily. 30 tablet 5  . sacubitril-valsartan (ENTRESTO) 49-51 MG Take 1 tablet by mouth 2 (two) times daily. 60 tablet 5   No current facility-administered medications for this visit.     Allergies:   Contrast media [iodinated diagnostic agents] and Lisinopril   Social History:  The patient  reports that he has never smoked. He has never used smokeless tobacco. He reports that he does not drink alcohol or use drugs.   Family History:  The patient's family history includes Cancer in his brother; Heart attack in his father; Hypertension in his sister and sister.    ROS:  Please see the history of present illness.   Otherwise, review of systems is positive for palpitations.   All other systems are reviewed and negative.   PHYSICAL EXAM: VS:  BP 122/68   Pulse 63   Ht 5\' 6"  (1.676 m)   Wt 252 lb (114.3 kg)   BMI 40.67 kg/m  , BMI Body mass index is 40.67 kg/m. GEN: Well nourished, well developed, in no acute distress  HEENT: normal  Neck: no JVD, carotid bruits, or masses Cardiac: RRR; no murmurs, rubs, or gallops,no edema  Respiratory:  clear to auscultation bilaterally, normal work of breathing GI: soft, nontender, nondistended, + BS MS: no deformity or atrophy  Skin: warm and dry, device site well healed Neuro:  Strength and sensation are intact Psych: euthymic mood, full affect  EKG:  EKG is ordered today. Personal review of the ekg ordered shows SR, anterior Q waves, 1dAVB  Personal review of the device interrogation today. Results in [place]: 05/15/2018: B Natriuretic Peptide 679.6 05/16/2018: Magnesium 1.7 05/18/2018: ALT 23 07/24/2018: Hemoglobin 11.9; Platelets 249.0 08/09/2018: BUN 30; Potassium 4.6; Sodium 140 09/19/2018: Creatinine, Ser 1.60    Lipid Panel     Component Value  Date/Time   CHOL 159 09/07/2016 0844   TRIG 146 09/07/2016 0844   HDL 25 (L) 09/07/2016 0844   CHOLHDL 6.4 09/07/2016 0844   VLDL 29 09/07/2016 0844   LDLCALC 105 (H) 09/07/2016 0844     Wt Readings from Last 3 Encounters:  10/03/18 252 lb (114.3 kg)  09/06/18 246 lb 3.2 oz (111.7 kg)  08/30/18 251 lb 5.2 oz (114 kg)      Other studies Reviewed: Additional studies/ records that were reviewed today include: TTE 08/28/16, Cath 09/07/16  Review of the above records today demonstrates:  - Left ventricle: The cavity size was mildly dilated. Wall   thickness was increased in a pattern of severe LVH. Systolic   function was moderately reduced. The estimated ejection fraction   was in the range of 35% to 40%. Akinesis of the basalinferior and   apical myocardium. Features are consistent with a pseudonormal  left ventricular filling pattern, with concomitant abnormal   relaxation and increased filling pressure (grade 2 diastolic   dysfunction). - Aortic valve: There was mild regurgitation. - Mitral valve: There was mild regurgitation. - Left atrium: The atrium was severely dilated.   There is moderate left ventricular systolic dysfunction.  LV end diastolic pressure is normal.  The left ventricular ejection fraction is 35-45% by visual estimate.  Ost RCA lesion, 90 %stenosed.  Mid RCA to Dist RCA lesion, 100 %stenosed.   1. Single vessel occlusive CAD. The distal RCA is occluded with left to right collaterals. There is a 90% ostial stenosis 2. Moderate LV dysfunction with EF 35-40%. Akinesis of the basal to mid inferior wall 3. Normal LVEDP   ASSESSMENT AND PLAN:  1.  VT/VF arrest: Out of hospital arrest with improvement in mental status.  Medtronic ICD implanted 10/10/2016.  Device functioning appropriately.  No changes.  Continue amiodarone.   2. Ischemic cardiomyopathy: Disease in the RCA.  On optimal medical therapy with Coreg and losartan.  ICD implanted for secondary  prevention.  3. CAD: No current chest pain  4. Hyperlipidemia: Continue statin  5. Hypertension: Well-controlled  6.  Paroxysmal atrial fibrillation: Found on ICD interrogation.  Patient is minimally symptomatic.  Arneta Mahmood start Eliquis today.  This patients CHA2DS2-VASc Score and unadjusted Ischemic Stroke Rate (% per year) is equal to 4.8 % stroke rate/year from a score of 4  Above score calculated as 1 point each if present [CHF, HTN, DM, Vascular=MI/PAD/Aortic Plaque, Age if 65-74, or Male] Above score calculated as 2 points each if present [Age > 75, or Stroke/TIA/TE]     Current medicines are reviewed at length with the patient today.   The patient does not have concerns regarding his medicines.  The following changes were made today: Start Eliquis  Labs/ tests ordered today include:  Orders Placed This Encounter  Procedures  . EKG 12-Lead     Disposition:   FU with Roan [place] 6 months  Signed, Cary Lothrop Meredith Leeds, MD  10/03/2018 3:06 PM     [address] [place] [address] [phone] (office) [phone] (fax)

## 2018-10-03 NOTE — Progress Notes (Signed)
Remote ICD transmission.   

## 2018-10-03 NOTE — Patient Instructions (Addendum)
Medication Instructions:  Your physician has recommended you make the following change in your medication:  1. START Eliquis 5 mg twice a day  *If you need a refill on your cardiac medications before your next appointment, please call your pharmacy*  Labwork: None ordered  Testing/Procedures: None ordered  Follow-Up: Remote monitoring is used to monitor your Pacemaker or ICD from home. This monitoring reduces the number of office visits required to check your device to one time per year. It allows [place] to keep an eye on the functioning of your device to ensure it is working properly. You are scheduled for a device check from home on 01/02/2019. You may send your transmission at any time that day. If you have a wireless device, the transmission will be sent automatically. After your physician reviews your transmission, you will receive a postcard with your next transmission date.  Your physician wants you to follow-up in: 1 year with Dr. Curt Bears.  You will receive a reminder letter in the mail two months in advance. If you don't receive a letter, please call our office to schedule the follow-up appointment.  Thank you for choosing [facility]!!   Trinidad Curet, RN [phone]  Any Other Special Instructions Will Be Listed Below (If Applicable).  Apixaban oral tablets What is this medicine? APIXABAN (a PIX a ban) is an anticoagulant (blood thinner). It is used to lower the chance of stroke in people with a medical condition called atrial fibrillation. It is also used to treat or prevent blood clots in the lungs or in the veins. This medicine may be used for other purposes; ask your health care provider or pharmacist if you have questions. COMMON BRAND NAME(S): Eliquis What should I tell my health care provider before I take this medicine? They need to know if you have any of these conditions: -bleeding disorders -bleeding in the brain -blood in your stools (black or tarry stools) or  if you have blood in your vomit -history of stomach bleeding -kidney disease -liver disease -mechanical heart valve -an unusual or allergic reaction to apixaban, other medicines, foods, dyes, or preservatives -pregnant or trying to get pregnant -breast-feeding How should I use this medicine? Take this medicine by mouth with a glass of water. Follow the directions on the prescription label. You can take it with or without food. If it upsets your stomach, take it with food. Take your medicine at regular intervals. Do not take it more often than directed. Do not stop taking except on your doctor's advice. Stopping this medicine may increase your risk of a blood clot. Be sure to refill your prescription before you run out of medicine. Talk to your pediatrician regarding the use of this medicine in children. Special care may be needed. Overdosage: If you think you have taken too much of this medicine contact a poison control center or emergency room at once. NOTE: This medicine is only for you. Do not share this medicine with others. What if I miss a dose? If you miss a dose, take it as soon as you can. If it is almost time for your next dose, take only that dose. Do not take double or extra doses. What may interact with this medicine? This medicine may interact with the following: -aspirin and aspirin-like medicines -certain medicines for fungal infections like ketoconazole and itraconazole -certain medicines for seizures like carbamazepine and phenytoin -certain medicines that treat or prevent blood clots like warfarin, enoxaparin, and dalteparin -clarithromycin -NSAIDs, medicines for pain  and inflammation, like ibuprofen or naproxen -rifampin -ritonavir -St. John's wort This list may not describe all possible interactions. Give your health care provider a list of all the medicines, herbs, non-prescription drugs, or dietary supplements you use. Also tell them if you smoke, drink alcohol, or  use illegal drugs. Some items may interact with your medicine. What should I watch for while using this medicine? Visit your healthcare professional for regular checks on your progress. You may need blood work done while you are taking this medicine. Your condition will be monitored carefully while you are receiving this medicine. It is important not to miss any appointments. Avoid sports and activities that might cause injury while you are using this medicine. Severe falls or injuries can cause unseen bleeding. Be careful when using sharp tools or knives. Consider using an Copy. Take special care brushing or flossing your teeth. Report any injuries, bruising, or red spots on the skin to your healthcare professional. If you are going to need surgery or other procedure, tell your healthcare professional that you are taking this medicine. Wear a medical ID bracelet or chain. Carry a card that describes your disease and details of your medicine and dosage times. What side effects may I notice from receiving this medicine? Side effects that you should report to your doctor or health care professional as soon as possible: -allergic reactions like skin rash, itching or hives, swelling of the face, lips, or tongue -signs and symptoms of bleeding such as bloody or black, tarry stools; red or dark-brown urine; spitting up blood or brown material that looks like coffee grounds; red spots on the skin; unusual bruising or bleeding from the eye, gums, or nose -signs and symptoms of a blood clot such as chest pain; shortness of breath; pain, swelling, or warmth in the leg -signs and symptoms of a stroke such as changes in vision; confusion; trouble speaking or understanding; severe headaches; sudden numbness or weakness of the face, arm or leg; trouble walking; dizziness; loss of coordination This list may not describe all possible side effects. Call your doctor for medical advice about side effects. You may  report side effects to FDA at 1-800-FDA-1088. Where should I keep my medicine? Keep out of the reach of children. Store at room temperature between 20 and 25 degrees C (68 and 77 degrees F). Throw away any unused medicine after the expiration date. NOTE: This sheet is a summary. It may not cover all possible information. If you have questions about this medicine, talk to your doctor, pharmacist, or health care provider.  2019 Elsevier/Gold Standard (2017-09-06 11:20:07)

## 2018-10-03 NOTE — Addendum Note (Signed)
Addended by: Stanton Kidney on: 10/03/2018 03:27 PM   Modules accepted: Orders

## 2018-10-04 LAB — CUP PACEART INCLINIC DEVICE CHECK
Battery Remaining Longevity: 126 mo
Battery Voltage: 3.01 V
Brady Statistic RV Percent Paced: 0.34 %
Date Time Interrogation Session: 20200109205155
HighPow Impedance: 65 Ohm
Implantable Lead Implant Date: 20180116
Implantable Lead Location: 753860
Implantable Lead Model: 6935
Implantable Pulse Generator Implant Date: 20180116
Lead Channel Impedance Value: 304 Ohm
Lead Channel Impedance Value: 380 Ohm
Lead Channel Pacing Threshold Amplitude: 0.75 V
Lead Channel Pacing Threshold Pulse Width: 0.4 ms
Lead Channel Sensing Intrinsic Amplitude: 13.125 mV
Lead Channel Sensing Intrinsic Amplitude: 14 mV
Lead Channel Setting Pacing Amplitude: 2.5 V
Lead Channel Setting Pacing Pulse Width: 0.4 ms
Lead Channel Setting Sensing Sensitivity: 0.3 mV

## 2018-10-09 ENCOUNTER — Telehealth: Payer: Self-pay | Admitting: Cardiology

## 2018-10-09 NOTE — Telephone Encounter (Signed)
Pt aware I will discuss ASA w/ Camnitz. Pt understands it may be next week before a return call.   Dr. Curt Bears is there a reason pt needs to remain on ASA? (Started Eliquis on 1/9 for PAF found on ICD interrogation)   Advised to follow up w/ PCP about his "pulse he can hear in his ear".  Informed that it is not related to the medication is taking.  Pt previously stated it isn't r/t to his BP either.  Advised to f/u w/ PCP for evaluation and possible ENT referral.  Pt agreeable to plan.

## 2018-10-09 NOTE — Telephone Encounter (Signed)
Follow Up:; ° ° °Returning your call. °

## 2018-10-09 NOTE — Telephone Encounter (Signed)
New Message:     Patient calling about some medication that he his taking. He would like to know if he can Asprin. Also patient states when he is sitting sometimes his pulse, that he can hear it in his ear.

## 2018-10-14 MED ORDER — ASPIRIN EC 81 MG PO TBEC: 81.0000 mg | DELAYED_RELEASE_TABLET | Freq: Every day | ORAL | 3 refills | Status: DC

## 2018-10-14 NOTE — Telephone Encounter (Signed)
Advised pt to continue ASA along w/ Eliquis, per Dr. Curt Bears. Patient verbalized understanding and agreeable to plan.

## 2018-10-29 ENCOUNTER — Other Ambulatory Visit: Payer: Self-pay

## 2018-10-29 MED ORDER — FUROSEMIDE 40 MG PO TABS: 60.0000 mg | ORAL_TABLET | Freq: Two times a day (BID) | ORAL | 11 refills | Status: DC

## 2018-10-30 NOTE — Telephone Encounter (Signed)
Spoke with patient and agreeable to monthly follow up.  Advised monitor should be by bedside in order for it to automatically transmit a report during sleep hours of 12 midnight and 6 AM.  Patient confirmed monitor is at bedside. Advised will receive a call after the transmission is reviewed to provide results.  Explained a Remote Home Transmission will be seen as daytime appointment on office summary visit sheet but the time is not relevant since all transmissions are sent at night time so there is no obligation to stay by the monitor at that time appointed time during the day. Provided ICM number and explained should call if experiencing any fluid symptoms such as weight gain, shortness of breath or extremity/abdominal swelling.  He stated when he has fluid he can tell by swelling in ankle and feet but has been awhile since having any symptoms.  He asked another question about why does he hear heartbeat in left ear when sitting or moving around.  Advised will try to get an answer to his question.

## 2018-11-08 ENCOUNTER — Ambulatory Visit (INDEPENDENT_AMBULATORY_CARE_PROVIDER_SITE_OTHER): Payer: Medicare HMO

## 2018-11-08 DIAGNOSIS — I5042 Chronic combined systolic (congestive) and diastolic (congestive) heart failure: Secondary | ICD-10-CM | POA: Diagnosis not present

## 2018-11-08 DIAGNOSIS — Z95810 Presence of automatic (implantable) cardiac defibrillator: Secondary | ICD-10-CM | POA: Diagnosis not present

## 2018-11-08 NOTE — Progress Notes (Signed)
EPIC Encounter for ICM Monitoring  Patient Name: Dean Pilger Sr. is a 68 y.o. male Date: 11/08/2018 Primary Care Physican: [name], MD Primary Cardiologist: Bunnie Domino, NP Electrophysiologist: Curt Bears Today's Weight: 247 lbs        1st ICM remote.  Heart Failure questions reviewed.  Pt had some swelling in the feet a few days ago but has resolved.     Report: Thoracic impedance abnormal suggesting fluid accumulation starting 09/07/2018 with an exception of few days at baseline.   Prescribed: Furosemide 40 mg take 1.5 tablets (60 mg total) by mouth twice a day.  Labs: 09/19/2018 Creatinine 1.60  08/09/2018 Creatinine 2.02, BUN 30, Potassium 4.6, Sodium 140, GFR 33-38  07/25/2018 Creatinine 1.57, BUN 21, Potassium 4.0, Sodium 138, GFR 45-52  07/10/2018 Creatinine 1.12, BUN 16, Potassium 3.7, Sodium 140, GFR 67-78  06/07/2018 Creatinine 1.51, BUN 28, Potassium 4.8, Sodium 141, GFR 47-54  05/31/2018 Creatinine 2.08, BUN 36, Potassium 5.1, Sodium 135, GFR 32-37  A complete set of results can be found in Results Review.  Recommendations:  Advised to limit dietary salt intake to < 2000 mg daily.  Encouraged to call for fluid symptoms.  Follow-up plan: ICM clinic phone appointment on 11/12/2018 (manual send) to recheck fluid levels.        Copy of ICM check sent to Dr. Curt Bears and Bunnie Domino, NP for review and recommendations if needed.   3 month ICM trend: 11/08/2018    1 Year ICM trend:       Rosalene Billings, RN 11/08/2018 12:12 PM

## 2018-11-09 ENCOUNTER — Other Ambulatory Visit: Payer: Self-pay | Admitting: Cardiology

## 2018-11-12 ENCOUNTER — Ambulatory Visit (INDEPENDENT_AMBULATORY_CARE_PROVIDER_SITE_OTHER): Payer: Medicare HMO

## 2018-11-12 DIAGNOSIS — I5042 Chronic combined systolic (congestive) and diastolic (congestive) heart failure: Secondary | ICD-10-CM

## 2018-11-12 DIAGNOSIS — Z95810 Presence of automatic (implantable) cardiac defibrillator: Secondary | ICD-10-CM

## 2018-11-13 ENCOUNTER — Telehealth: Payer: Self-pay

## 2018-11-13 NOTE — Telephone Encounter (Signed)
Spoke with patient to remind of missed remote transmission 

## 2018-11-13 NOTE — Progress Notes (Signed)
EPIC Encounter for ICM Monitoring  Patient Name: Lenox Bink Sr. is a 68 y.o. male Date: 11/13/2018 Primary Care Physican: [name], MD Primary Cardiologist: Bunnie Domino, NP Electrophysiologist: Curt Bears Last Weight: 247 lbs Today's Weight: unknown                                                            Attempted call to patient and unable to reach.  Left detailed message per DPR regarding transmission. Transmission reviewed.    Report: Thoracic impedance returned to normal since last remote transmission on 11/08/2018   Prescribed: Furosemide 40 mg take 1.5 tablets (60 mg total) by mouth twice a day.  Labs: 09/19/2018 Creatinine 1.60  08/09/2018 Creatinine 2.02, BUN 30, Potassium 4.6, Sodium 140, GFR 33-38  07/25/2018 Creatinine 1.57, BUN 21, Potassium 4.0, Sodium 138, GFR 45-52  07/10/2018 Creatinine 1.12, BUN 16, Potassium 3.7, Sodium 140, GFR 67-78  06/07/2018 Creatinine 1.51, BUN 28, Potassium 4.8, Sodium 141, GFR 47-54  05/31/2018 Creatinine 2.08, BUN 36, Potassium 5.1, Sodium 135, GFR 32-37  A complete set of results can be found in Results Review.  Recommendations:  Left voice mail with ICM number and encouraged to call if experiencing any fluid symptoms.  Follow-up plan: ICM clinic phone appointment on 12/09/2018.        Copy of ICM check sent to Dr. Curt Bears.  3 month ICM trend: 11/13/2018    1 Year ICM trend:       Rosalene Billings, RN 11/13/2018 12:33 PM

## 2018-11-13 NOTE — Telephone Encounter (Signed)
Remote ICM transmission received.  Attempted call to patient regarding ICM remote transmission and left detailed message, per DPR, with next ICM remote transmission date of 12/09/2018.  Advised to return call for any fluid symptoms or questions.    

## 2018-12-09 ENCOUNTER — Ambulatory Visit (INDEPENDENT_AMBULATORY_CARE_PROVIDER_SITE_OTHER): Payer: Medicare HMO

## 2018-12-09 ENCOUNTER — Other Ambulatory Visit: Payer: Self-pay

## 2018-12-09 DIAGNOSIS — Z95810 Presence of automatic (implantable) cardiac defibrillator: Secondary | ICD-10-CM

## 2018-12-09 DIAGNOSIS — I5042 Chronic combined systolic (congestive) and diastolic (congestive) heart failure: Secondary | ICD-10-CM

## 2018-12-10 ENCOUNTER — Telehealth: Payer: Self-pay

## 2018-12-10 NOTE — Progress Notes (Signed)
EPIC Encounter for ICM Monitoring  Patient Name: Dean Delapena Sr. is a 68 y.o. male Date: 12/10/2018 Primary Care Physican: [name], MD Primary [place], NP Electrophysiologist:[place] Last Weight: 247 lbs Today's Weight: unknown    Attempted call to patient and unable to reach.   Transmission reviewed.   Report: Thoracic impedance normal but was abnormal suggesting fluid accumulation from 11/16/2018 through 12/07/2018  Prescribed: Furosemide40 mg take 1.5 tablets (60 mg total) by mouth twice a day.  Labs: 09/19/2018 Creatinine1.60  08/09/2018 Creatinine2.02, BUN30, Potassium4.6, Sodium140, [id number]  07/25/2018 Creatinine1.57, BUN21, Potassium4.0, [id number], [id number]  07/10/2018 Creatinine1.12, BUN16, Potassium3.7, Sodium140, [id number]  06/07/2018 Creatinine1.51, BUN28, Potassium4.8, [id number], [id number]  05/31/2018 Creatinine2.08, BUN36, Potassium5.1, [id number], [id number] A complete set of results can be found in Results Review.  Recommendations:Left voice mail with ICM number and encouraged to call if experiencing any fluid symptoms.  Follow-up plan: ICM clinic phone appointment on 01/09/2019.   Copy of ICM check sent to [facility].  3 month ICM trend: 12/09/2018    1 Year ICM trend:       Rosalene Billings, RN 12/10/2018 7:31 AM

## 2018-12-10 NOTE — Telephone Encounter (Signed)
Remote ICM transmission received.  Attempted call to patient regarding ICM remote transmission and no answer or answering machine. 

## 2018-12-19 ENCOUNTER — Other Ambulatory Visit: Payer: Self-pay | Admitting: Cardiology

## 2018-12-19 ENCOUNTER — Telehealth: Payer: Self-pay | Admitting: Cardiology

## 2018-12-19 ENCOUNTER — Other Ambulatory Visit: Payer: Self-pay | Admitting: *Deleted

## 2018-12-19 MED ORDER — SACUBITRIL-VALSARTAN 49-51 MG PO TABS: 1.0000 | ORAL_TABLET | Freq: Two times a day (BID) | ORAL | 2 refills | Status: DC

## 2018-12-19 MED ORDER — APIXABAN 5 MG PO TABS: 5.0000 mg | ORAL_TABLET | Freq: Two times a day (BID) | ORAL | 5 refills | Status: DC

## 2018-12-19 NOTE — Telephone Encounter (Signed)
Pt's medication was sent to pt's pharmacy as requested. Confirmation received.  °

## 2018-12-19 NOTE — Telephone Encounter (Signed)
Pt cut back his Eliquis to once a day because he has been waking up most mornings with his arms numb and  tingling like they are asleep. Pt noted this after he was placed on Eliquis. Once he sits up and shakes his arms out the feeling goes away, only lasting up to 5-10 minutes. This does not happen any other time of day, only when he gets up in the morning.       Medication importance was reviewed with pt and to take as prescribed. Medication was reviewed and this was not listed as a side effect.       Discussed possibilities of sleep position and pillows. Pt agreed that this sounds like a possible answer. Pt agreed to restart second dose of  Eliquis.  Pt was told that Dr Curt Bears will review  note and give further advice if needed. Pt was advised to call for further advice at anytime. Pt agreed to plan.

## 2018-12-19 NOTE — Telephone Encounter (Signed)
Rerouted to refill team

## 2018-12-19 NOTE — Telephone Encounter (Signed)
New message       *STAT* If patient is at the pharmacy, call can be transferred to refill team.   1. Which medications need to be refilled? (please list name of each medication and dose if known) apixaban (ELIQUIS) 5 MG TABS tablet sacubitril-valsartan (ENTRESTO) 49-51 MG  2. Which pharmacy/location (including street and city if local pharmacy) is medication to be sent to? [id number] - [place], [place] [address]  3. Do they need a 30 day or 90 day supply? [place]

## 2018-12-19 NOTE — Telephone Encounter (Signed)
New message   Pt c/o medication issue:  1. Name of Medication: apixaban (ELIQUIS) 5 MG TABS tablet  2. How are you currently taking this medication (dosage and times per day)?twice daily  3. Are you having a reaction (difficulty breathing--STAT)? No   4. What is your medication issue? Patient states that this medication is causing his arms to ache. Please advise.

## 2018-12-19 NOTE — Telephone Encounter (Signed)
68yo, 114.3kg, Scr 2.02 on 08/09/18 Last OV 10/03/18

## 2018-12-24 ENCOUNTER — Other Ambulatory Visit: Payer: Self-pay | Admitting: Cardiology

## 2018-12-24 NOTE — Telephone Encounter (Signed)
[facility] is requesting a refill on pantoprazole 40 mg tablet. Would Dr. Curt Bears like to refill this medication? Please address

## 2018-12-25 NOTE — Telephone Encounter (Signed)
Should get refilled through PCP

## 2019-01-01 ENCOUNTER — Other Ambulatory Visit: Payer: Self-pay

## 2019-01-01 ENCOUNTER — Ambulatory Visit (INDEPENDENT_AMBULATORY_CARE_PROVIDER_SITE_OTHER): Payer: Medicare HMO | Admitting: *Deleted

## 2019-01-01 DIAGNOSIS — I5042 Chronic combined systolic (congestive) and diastolic (congestive) heart failure: Secondary | ICD-10-CM | POA: Diagnosis not present

## 2019-01-01 DIAGNOSIS — I255 Ischemic cardiomyopathy: Secondary | ICD-10-CM

## 2019-01-01 LAB — CUP PACEART REMOTE DEVICE CHECK
Battery Remaining Longevity: 124 mo
Battery Voltage: 3.01 V
Brady Statistic RV Percent Paced: 0.13 %
Date Time Interrogation Session: 20200408114944
HighPow Impedance: 66 Ohm
Implantable Lead Implant Date: 20180116
Implantable Lead Location: 753860
Implantable Lead Model: 6935
Implantable Pulse Generator Implant Date: 20180116
Lead Channel Impedance Value: 266 Ohm
Lead Channel Impedance Value: 342 Ohm
Lead Channel Pacing Threshold Amplitude: 0.625 V
Lead Channel Pacing Threshold Pulse Width: 0.4 ms
Lead Channel Sensing Intrinsic Amplitude: 12.125 mV
Lead Channel Sensing Intrinsic Amplitude: 12.125 mV
Lead Channel Setting Pacing Amplitude: 2.5 V
Lead Channel Setting Pacing Pulse Width: 0.4 ms
Lead Channel Setting Sensing Sensitivity: 0.3 mV

## 2019-01-09 ENCOUNTER — Ambulatory Visit (INDEPENDENT_AMBULATORY_CARE_PROVIDER_SITE_OTHER): Payer: Medicare HMO

## 2019-01-09 ENCOUNTER — Other Ambulatory Visit: Payer: Self-pay

## 2019-01-09 DIAGNOSIS — Z95810 Presence of automatic (implantable) cardiac defibrillator: Secondary | ICD-10-CM | POA: Diagnosis not present

## 2019-01-09 DIAGNOSIS — I5042 Chronic combined systolic (congestive) and diastolic (congestive) heart failure: Secondary | ICD-10-CM

## 2019-01-09 NOTE — Progress Notes (Signed)
Remote ICD transmission.   

## 2019-01-10 ENCOUNTER — Telehealth: Payer: Self-pay

## 2019-01-10 NOTE — Progress Notes (Signed)
EPIC Encounter for ICM Monitoring  Patient Name: Vint Pola Sr. is a 68 y.o. male Date: 01/10/2019 Primary Care Physican: [name], MD Primary [place], NP Electrophysiologist:[place] Last Weight: 247 lbs Today's Weight:unknown   Attempted call to patient and unable to reach.  Transmission reviewed.   Report: Thoracic impedance normal.  Prescribed: Furosemide40 mg take 1.5 tablets (60 mg total) by mouth twice a day.  Labs: 09/19/2018 Creatinine1.60  08/09/2018 Creatinine2.02, BUN30, Potassium4.6, Sodium140, [id number]  07/25/2018 Creatinine1.57, BUN21, Potassium4.0, IXVEZB015, AEW25-74  07/10/2018 Creatinine1.12, BUN16, Potassium3.7, Sodium140, VTX52-17  06/07/2018 Creatinine1.51, BUN28, Potassium4.8, [id number], DSW97-91  05/31/2018 Creatinine2.08, BUN36, Potassium5.1, RWCHJS438, PJR93-96 A complete set of results can be found in Results Review.  Recommendations:Unable to reach.  Follow-up plan: ICM clinic phone appointment on 02/11/2019.   Copy of ICM check sent to [facility].  3 month ICM trend: 01/09/2019    1 Year ICM trend:       Rosalene Billings, RN 01/10/2019 4:12 PM

## 2019-01-10 NOTE — Telephone Encounter (Signed)
Remote ICM transmission received.  Attempted call to patient regarding ICM remote transmission and left message, per DPR, to return call.    

## 2019-01-13 NOTE — Progress Notes (Signed)
Returned call to patient as requested by message.  Provided patient with direct contact number.  Reviewed transmission. He said he is feeling fine and denied any fluid symptoms. It has been more difficult to limit salt since grocery stores have limited selection of foods.  Advised to do the best he can at this time. Encouraged to call for any fluid symptoms. No changes today.  Next remote transmission 02/10/2019.

## 2019-01-13 NOTE — Telephone Encounter (Signed)
Returned patient call.  See last ICM note.

## 2019-01-13 NOTE — Telephone Encounter (Addendum)
° °  Patient returning call. Per Jannifer Rodney. In device patient needs to speak with Sharman Cheek.  Please call [phone]

## 2019-02-11 ENCOUNTER — Ambulatory Visit (INDEPENDENT_AMBULATORY_CARE_PROVIDER_SITE_OTHER): Payer: Medicare HMO

## 2019-02-11 ENCOUNTER — Other Ambulatory Visit: Payer: Self-pay

## 2019-02-11 DIAGNOSIS — Z95810 Presence of automatic (implantable) cardiac defibrillator: Secondary | ICD-10-CM | POA: Diagnosis not present

## 2019-02-11 DIAGNOSIS — I5042 Chronic combined systolic (congestive) and diastolic (congestive) heart failure: Secondary | ICD-10-CM | POA: Diagnosis not present

## 2019-02-14 NOTE — Progress Notes (Signed)
EPIC Encounter for ICM Monitoring  Patient Name: Dean Munguia Sr. is a 68 y.o. male Date: 02/14/2019 Primary Care Physican: [name], MD Primary [place], NP Electrophysiologist:Camnitz Last Weight: 247 lbs  Transmission reviewed and results sent via mychart.   Optivol Thoracic impedance normal.    Prescribed: Furosemide40 mg take 1.5 tablets (60 mg total) by mouth twice a day.  Labs: 09/19/2018 Creatinine1.60  08/09/2018 Creatinine2.02, BUN30, Potassium4.6, Sodium140, EIH53-91  07/25/2018 Creatinine1.57, BUN21, Potassium4.0, [id number], VIF12-52  07/10/2018 Creatinine1.12, BUN16, Potassium3.7, Sodium140, VHS92-90  06/07/2018 Creatinine1.51, BUN28, Potassium4.8, [id number], LGS93-24  05/31/2018 Creatinine2.08, BUN36, Potassium5.1, NHRVAC458, AKL50-75 A complete set of results can be found in Results Review.  Recommendations: None  Follow-up plan: ICM clinic phone appointment on6/22/2020.   Copy of ICM check sent to [facility].  3 month ICM trend: 02/11/2019    1 Year ICM trend:       Dean Billings, RN 02/14/2019 12:09 PM

## 2019-02-18 ENCOUNTER — Other Ambulatory Visit: Payer: Self-pay | Admitting: Cardiology

## 2019-02-18 MED ORDER — AMIODARONE HCL 200 MG PO TABS: 200.0000 mg | ORAL_TABLET | Freq: Every day | ORAL | 2 refills | Status: DC

## 2019-02-25 ENCOUNTER — Telehealth: Payer: Self-pay | Admitting: Cardiology

## 2019-02-25 NOTE — Telephone Encounter (Signed)
Pt calls in reporting occasional weakness in his shoulders/legs.  Has been occurring on and off for the past several weeks. Saw PCP last week but did not discuss. States he was feeling fine at that appt and didn't even think to discuss with him. Reports occasional "whooziness" when standing . BP this morning 135/53. Denies recent illness, temperature or COVID r/t symptoms. Denies weight gain/swelling, SOB, syncope. Pt feeling fine currently. Pt advised that I would forward to [facility] for advisement. Pt understands it will be end of week before return call. Pt advised that he will more likely recommend following up w/ PCP first, but will wait for advisement. Advised to call back or PCP if symptoms worsen before return call. Pt agreeable to above stated plan

## 2019-02-25 NOTE — Telephone Encounter (Signed)
New Message            Patient is calling about having some weakness in shoulder/legs and it comes and goes.Patient would like a call back concerning

## 2019-02-27 ENCOUNTER — Other Ambulatory Visit: Payer: Self-pay

## 2019-02-27 ENCOUNTER — Inpatient Hospital Stay (HOSPITAL_COMMUNITY)
Admission: EM | Admit: 2019-02-27 | Discharge: 2019-03-02 | DRG: 683 | Disposition: A | Discharge status: Home / Self Care | Payer: Medicare HMO | Attending: Internal Medicine | Admitting: Internal Medicine

## 2019-02-27 ENCOUNTER — Encounter (HOSPITAL_COMMUNITY): Payer: Self-pay | Admitting: *Deleted

## 2019-02-27 DIAGNOSIS — N179 Acute kidney failure, unspecified: Principal | ICD-10-CM | POA: Diagnosis present

## 2019-02-27 DIAGNOSIS — I5042 Chronic combined systolic (congestive) and diastolic (congestive) heart failure: Secondary | ICD-10-CM | POA: Diagnosis present

## 2019-02-27 DIAGNOSIS — Z9119 Patient's noncompliance with other medical treatment and regimen: Secondary | ICD-10-CM | POA: Diagnosis not present

## 2019-02-27 DIAGNOSIS — E875 Hyperkalemia: Secondary | ICD-10-CM | POA: Diagnosis present

## 2019-02-27 DIAGNOSIS — J45909 Unspecified asthma, uncomplicated: Secondary | ICD-10-CM | POA: Diagnosis present

## 2019-02-27 DIAGNOSIS — I480 Paroxysmal atrial fibrillation: Secondary | ICD-10-CM | POA: Diagnosis present

## 2019-02-27 DIAGNOSIS — Z8249 Family history of ischemic heart disease and other diseases of the circulatory system: Secondary | ICD-10-CM | POA: Diagnosis not present

## 2019-02-27 DIAGNOSIS — E871 Hypo-osmolality and hyponatremia: Secondary | ICD-10-CM | POA: Diagnosis not present

## 2019-02-27 DIAGNOSIS — N189 Chronic kidney disease, unspecified: Secondary | ICD-10-CM | POA: Diagnosis present

## 2019-02-27 DIAGNOSIS — E785 Hyperlipidemia, unspecified: Secondary | ICD-10-CM | POA: Diagnosis present

## 2019-02-27 DIAGNOSIS — G4733 Obstructive sleep apnea (adult) (pediatric): Secondary | ICD-10-CM | POA: Diagnosis present

## 2019-02-27 DIAGNOSIS — E860 Dehydration: Secondary | ICD-10-CM | POA: Diagnosis present

## 2019-02-27 DIAGNOSIS — Z95810 Presence of automatic (implantable) cardiac defibrillator: Secondary | ICD-10-CM

## 2019-02-27 DIAGNOSIS — Z1159 Encounter for screening for other viral diseases: Secondary | ICD-10-CM | POA: Diagnosis not present

## 2019-02-27 DIAGNOSIS — Z8674 Personal history of sudden cardiac arrest: Secondary | ICD-10-CM | POA: Diagnosis not present

## 2019-02-27 DIAGNOSIS — I447 Left bundle-branch block, unspecified: Secondary | ICD-10-CM | POA: Diagnosis present

## 2019-02-27 DIAGNOSIS — Z6839 Body mass index (BMI) 39.0-39.9, adult: Secondary | ICD-10-CM

## 2019-02-27 DIAGNOSIS — E1122 Type 2 diabetes mellitus with diabetic chronic kidney disease: Secondary | ICD-10-CM | POA: Diagnosis present

## 2019-02-27 DIAGNOSIS — Z7901 Long term (current) use of anticoagulants: Secondary | ICD-10-CM

## 2019-02-27 DIAGNOSIS — E1169 Type 2 diabetes mellitus with other specified complication: Secondary | ICD-10-CM

## 2019-02-27 DIAGNOSIS — I2510 Atherosclerotic heart disease of native coronary artery without angina pectoris: Secondary | ICD-10-CM | POA: Diagnosis present

## 2019-02-27 DIAGNOSIS — Z6835 Body mass index (BMI) 35.0-35.9, adult: Secondary | ICD-10-CM | POA: Diagnosis present

## 2019-02-27 DIAGNOSIS — R531 Weakness: Secondary | ICD-10-CM | POA: Diagnosis present

## 2019-02-27 DIAGNOSIS — N183 Chronic kidney disease, stage 3 unspecified: Secondary | ICD-10-CM | POA: Diagnosis present

## 2019-02-27 DIAGNOSIS — E119 Type 2 diabetes mellitus without complications: Secondary | ICD-10-CM

## 2019-02-27 DIAGNOSIS — I130 Hypertensive heart and chronic kidney disease with heart failure and stage 1 through stage 4 chronic kidney disease, or unspecified chronic kidney disease: Secondary | ICD-10-CM | POA: Diagnosis present

## 2019-02-27 DIAGNOSIS — E6609 Other obesity due to excess calories: Secondary | ICD-10-CM | POA: Diagnosis present

## 2019-02-27 DIAGNOSIS — I10 Essential (primary) hypertension: Secondary | ICD-10-CM | POA: Diagnosis present

## 2019-02-27 DIAGNOSIS — I255 Ischemic cardiomyopathy: Secondary | ICD-10-CM | POA: Diagnosis present

## 2019-02-27 LAB — BASIC METABOLIC PANEL
Anion gap: 14 (ref 5–15)
BUN: 135 mg/dL — ABNORMAL HIGH (ref 8–23)
CO2: 20 mmol/L — ABNORMAL LOW (ref 22–32)
Calcium: 10.1 mg/dL (ref 8.9–10.3)
Chloride: 97 mmol/L — ABNORMAL LOW (ref 98–111)
Creatinine, Ser: 5.92 mg/dL — ABNORMAL HIGH (ref 0.61–1.24)
GFR calc Af Amer: 10 mL/min — ABNORMAL LOW (ref >60)
GFR calc non Af Amer: 9 mL/min — ABNORMAL LOW (ref >60)
Glucose, Bld: 114 mg/dL — ABNORMAL HIGH (ref 70–99)
Potassium: 4.7 mmol/L (ref 3.5–5.1)
Sodium: 131 mmol/L — ABNORMAL LOW (ref 135–145)

## 2019-02-27 LAB — CBC
HCT: 35 % — ABNORMAL LOW (ref 39.0–52.0)
Hemoglobin: 11.6 g/dL — ABNORMAL LOW (ref 13.0–17.0)
MCH: 28.2 pg (ref 26.0–34.0)
MCHC: 33.1 g/dL (ref 30.0–36.0)
MCV: 85.2 fL (ref 80.0–100.0)
Platelets: 201 10*3/uL (ref 150–400)
RBC: 4.11 MIL/uL — ABNORMAL LOW (ref 4.22–5.81)
RDW: 14 % (ref 11.5–15.5)
WBC: 12.3 10*3/uL — ABNORMAL HIGH (ref 4.0–10.5)
nRBC: 0 % (ref 0.0–0.2)

## 2019-02-27 LAB — URINALYSIS, ROUTINE W REFLEX MICROSCOPIC
Bacteria, UA: NONE SEEN
Bilirubin Urine: NEGATIVE
Glucose, UA: NEGATIVE mg/dL
Hgb urine dipstick: NEGATIVE
Ketones, ur: NEGATIVE mg/dL
Nitrite: NEGATIVE
Protein, ur: NEGATIVE mg/dL
Specific Gravity, Urine: 1.01 (ref 1.005–1.030)
pH: 5 (ref 5.0–8.0)

## 2019-02-27 LAB — CBG MONITORING, ED: Glucose-Capillary: 126 mg/dL — ABNORMAL HIGH (ref 70–99)

## 2019-02-27 LAB — HEMOGLOBIN A1C
Hgb A1c MFr Bld: 6.1 % — ABNORMAL HIGH (ref 4.8–5.6)
Mean Plasma Glucose: 128.37 mg/dL

## 2019-02-27 LAB — GLUCOSE, CAPILLARY: Glucose-Capillary: 100 mg/dL — ABNORMAL HIGH (ref 70–99)

## 2019-02-27 LAB — SODIUM, URINE, RANDOM: Sodium, Ur: 55 mmol/L

## 2019-02-27 LAB — SARS CORONAVIRUS 2 BY RT PCR (HOSPITAL ORDER, PERFORMED IN ~~LOC~~ HOSPITAL LAB): SARS Coronavirus 2: NEGATIVE

## 2019-02-27 LAB — CREATININE, URINE, RANDOM: Creatinine, Urine: 109.56 mg/dL

## 2019-02-27 MED ORDER — ZOLPIDEM TARTRATE 5 MG PO TABS
5.0000 mg | ORAL_TABLET | Freq: Every evening | ORAL | Status: DC | PRN
  Administered 2019-02-27 – 2019-03-01 (×2): 5 mg via ORAL
  Filled 2019-02-27 (×2): qty 1

## 2019-02-27 MED ORDER — PANTOPRAZOLE SODIUM 40 MG PO TBEC
40.0000 mg | DELAYED_RELEASE_TABLET | Freq: Every day | ORAL | Status: DC
  Administered 2019-02-28 – 2019-03-02 (×3): 40 mg via ORAL
  Filled 2019-02-27 (×4): qty 1

## 2019-02-27 MED ORDER — OXYCODONE HCL 5 MG PO TABS: 5.0000 mg | ORAL_TABLET | ORAL | Status: DC | PRN

## 2019-02-27 MED ORDER — FLUTICASONE FUROATE-VILANTEROL 200-25 MCG/INH IN AEPB
1.0000 | INHALATION_SPRAY | Freq: Every day | RESPIRATORY_TRACT | Status: DC
  Administered 2019-02-28 – 2019-03-02 (×3): 1 via RESPIRATORY_TRACT
  Filled 2019-02-27: qty 28

## 2019-02-27 MED ORDER — SODIUM CHLORIDE 0.9% FLUSH: 3.0000 mL | Freq: Once | INTRAVENOUS | Status: DC

## 2019-02-27 MED ORDER — APIXABAN 5 MG PO TABS
5.0000 mg | ORAL_TABLET | Freq: Two times a day (BID) | ORAL | Status: DC
  Administered 2019-02-27 – 2019-03-02 (×6): 5 mg via ORAL
  Filled 2019-02-27 (×6): qty 1

## 2019-02-27 MED ORDER — POLYETHYLENE GLYCOL 3350 17 G PO PACK: 17.0000 g | PACK | Freq: Every day | ORAL | Status: DC | PRN

## 2019-02-27 MED ORDER — MAGNESIUM OXIDE 400 (241.3 MG) MG PO TABS
200.0000 mg | ORAL_TABLET | Freq: Two times a day (BID) | ORAL | Status: DC
  Administered 2019-02-27 – 2019-03-02 (×6): 200 mg via ORAL
  Filled 2019-02-27 (×6): qty 1

## 2019-02-27 MED ORDER — BENZONATATE 100 MG PO CAPS
200.0000 mg | ORAL_CAPSULE | Freq: Three times a day (TID) | ORAL | Status: DC | PRN
  Administered 2019-02-28 – 2019-03-01 (×3): 200 mg via ORAL
  Filled 2019-02-27 (×3): qty 2

## 2019-02-27 MED ORDER — HEPARIN SODIUM (PORCINE) 5000 UNIT/ML IJ SOLN: 5000.0000 [IU] | Freq: Three times a day (TID) | INTRAMUSCULAR | Status: DC

## 2019-02-27 MED ORDER — ATORVASTATIN CALCIUM 40 MG PO TABS
40.0000 mg | ORAL_TABLET | Freq: Every day | ORAL | Status: DC
  Administered 2019-02-27 – 2019-03-01 (×3): 40 mg via ORAL
  Filled 2019-02-27 (×3): qty 1

## 2019-02-27 MED ORDER — ASPIRIN EC 81 MG PO TBEC: 81.0000 mg | DELAYED_RELEASE_TABLET | Freq: Every day | ORAL | Status: DC

## 2019-02-27 MED ORDER — SODIUM CHLORIDE 0.9% FLUSH
3.0000 mL | Freq: Two times a day (BID) | INTRAVENOUS | Status: DC
  Administered 2019-03-01 – 2019-03-02 (×3): 3 mL via INTRAVENOUS

## 2019-02-27 MED ORDER — VITAMIN D 25 MCG (1000 UNIT) PO TABS
1000.0000 [IU] | ORAL_TABLET | Freq: Every day | ORAL | Status: DC
  Administered 2019-02-28 – 2019-03-02 (×3): 1000 [IU] via ORAL
  Filled 2019-02-27 (×4): qty 1

## 2019-02-27 MED ORDER — CARVEDILOL 12.5 MG PO TABS
25.0000 mg | ORAL_TABLET | Freq: Two times a day (BID) | ORAL | Status: DC
  Administered 2019-02-28 – 2019-03-02 (×5): 25 mg via ORAL
  Filled 2019-02-27 (×5): qty 2

## 2019-02-27 MED ORDER — SODIUM CHLORIDE 0.9 % IV BOLUS
500.0000 mL | Freq: Once | INTRAVENOUS | Status: AC
  Administered 2019-02-27: 500 mL via INTRAVENOUS

## 2019-02-27 MED ORDER — ACETAMINOPHEN 325 MG PO TABS: 650.0000 mg | ORAL_TABLET | Freq: Four times a day (QID) | ORAL | Status: DC | PRN

## 2019-02-27 MED ORDER — AMIODARONE HCL 200 MG PO TABS
200.0000 mg | ORAL_TABLET | Freq: Every day | ORAL | Status: DC
  Administered 2019-02-28 – 2019-03-02 (×3): 200 mg via ORAL
  Filled 2019-02-27 (×3): qty 1

## 2019-02-27 MED ORDER — ACETAMINOPHEN 650 MG RE SUPP: 650.0000 mg | Freq: Four times a day (QID) | RECTAL | Status: DC | PRN

## 2019-02-27 MED ORDER — MENTHOL 3 MG MT LOZG
1.0000 | LOZENGE | OROMUCOSAL | Status: DC | PRN
  Administered 2019-03-01 – 2019-03-02 (×2): 3 mg via ORAL
  Filled 2019-02-27 (×2): qty 9

## 2019-02-27 NOTE — ED Notes (Signed)
ED TO INPATIENT HANDOFF REPORT  ED Nurse Name and Phone #: Daralene Milch, RN [phone]  S Name/Age/Gender Dean Bones Sr. 67 y.o. male Room/Bed: 036C/036C  Code Status   Code Status: Full Code  Home/SNF/Other Home Patient oriented to: self, place, time and situation Is this baseline? Yes   Triage Complete: Triage complete  Chief Complaint Weakness  Triage Note Pt reports he has not been feeling well with weakness for 3 weeks now.  He has a defibrillator in place.  He states speaking to his PCP and was told that his kidney function has been declining.  He is A&Ox 4.  He denies any cp, sob, or dizziness.     Allergies Allergies  Allergen Reactions  . Contrast Media [Iodinated Diagnostic Agents] Shortness Of Breath and Nausea And Vomiting  . Lisinopril Cough    Level of Care/Admitting Diagnosis ED Disposition    ED Disposition Condition [facility]: [place] [[id number]]  Level of Care: Telemetry Medical [104]  Covid Evaluation: Screening Protocol (No Symptoms)  Diagnosis: Acute kidney failure [facility]) [[id number]]  Admitting Physician: Lady Deutscher [[id number]]  Attending Physician: Lady Deutscher [[id number]]  Estimated length of stay: 3 - 4 days  Certification:: I certify this patient will need inpatient services for at least 2 midnights  PT Class (Do Not Modify): Inpatient [101]  PT Acc Code (Do Not Modify): Private [1]       B Medical/Surgery History Past Medical History:  Diagnosis Date  . 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 Arthritis    "maybe in his spine" (09/05/2016)  . Asthma    pt. denies  . Cardiac arrest ([place]) 08/27/2016   REQUIRING CPR, SHOCK & MEDICATIONS  . CHF (congestive heart failure) ([place])   . Coronary artery disease    90% ostRCA stenosis and total occlusion of dRCA with L-->R collaterals 2017  . Dysrhythmia   . Gout   . High cholesterol   . Hypertension   . Ischemic  cardiomyopathy   . LBBB (left bundle branch block)   . OSA on CPAP   . Type II diabetes mellitus ([place])    Past Surgical History:  Procedure Laterality Date  . CARDIAC CATHETERIZATION N/A 09/07/2016   Procedure: Left Heart Cath and Coronary Angiography;  Surgeon: Peter M Martinique, MD;  Location: [place] [facility];  Service: Cardiovascular;  Laterality: N/A;  . COLONOSCOPY WITH PROPOFOL N/A 08/30/2018   Procedure: COLONOSCOPY WITH PROPOFOL;  Surgeon: Carol Ada, MD;  Location: [facility];  Service: Endoscopy;  Laterality: N/A;  . EP IMPLANTABLE DEVICE N/A 10/10/2016   Procedure: ICD Implant;  Surgeon: Will Meredith Leeds, MD;  Location: [place] [facility];  Service: Cardiovascular;  Laterality: N/A;  . LUMBAR LAMINECTOMY/DECOMPRESSION MICRODISCECTOMY N/A 01/18/2018   Procedure: L2-3, L3-4, L-4-5 CENTRAL DECOMPRESSION;  Surgeon: [name], MD;  Location: [place];  Service: Orthopedics;  Laterality: N/A;  . POLYPECTOMY  08/30/2018   Procedure: POLYPECTOMY;  Surgeon: Carol Ada, MD;  Location: [facility];  Service: Endoscopy;;     A IV Location/Drains/Wounds Patient Lines/Drains/Airways Status   Active Line/Drains/Airways    Name:   Placement date:   Placement time:   Site:   Days:   Peripheral IV 02/27/19 Left Hand   02/27/19    1816    Hand   less than 1   Incision (Closed) 01/18/18 Back Other (Comment)   01/18/18    1152     405  Intake/Output Last 24 hours  Intake/Output Summary (Last 24 hours) at 02/27/2019 1958 Last data filed at 02/27/2019 1851 Gross per 24 hour  Intake 500 ml  Output -  Net 500 ml    Labs/Imaging Results for orders placed or performed during the hospital encounter of 02/27/19 (from the past 48 hour(s))  Basic metabolic panel     Status: Abnormal   Collection Time: 02/27/19  3:37 PM  Result Value Ref Range   Sodium 131 (L) 135 - 145 mmol/L   Potassium 4.7 3.5 - 5.1 mmol/L   Chloride 97 (L) 98 - 111 mmol/L   CO2 20 (L) 22 - 32 mmol/L    Glucose, Bld 114 (H) 70 - 99 mg/dL   BUN 135 (H) 8 - 23 mg/dL   Creatinine, Ser 5.92 (H) 0.61 - 1.24 mg/dL   Calcium 10.1 8.9 - 10.3 mg/dL   GFR calc non Af Amer 9 (L) >60 mL/min   GFR calc Af Amer 10 (L) >60 mL/min   Anion gap 14 5 - 15    Comment: Performed at [facility], [address] [place], [place] [address]  CBC     Status: Abnormal   Collection Time: 02/27/19  3:37 PM  Result Value Ref Range   WBC 12.3 (H) 4.0 - 10.5 K/uL   RBC 4.11 (L) 4.22 - 5.81 MIL/uL   Hemoglobin 11.6 (L) 13.0 - 17.0 g/dL   HCT 35.0 (L) 39.0 - 52.0 %   MCV 85.2 80.0 - 100.0 fL   MCH 28.2 26.0 - 34.0 pg   MCHC 33.1 30.0 - 36.0 g/dL   RDW 14.0 11.5 - 15.5 %   Platelets 201 150 - 400 K/uL   nRBC 0.0 0.0 - 0.2 %    Comment: Performed at [facility], [place] [address], [place], [place] [address]  Urinalysis, Routine w reflex microscopic     Status: Abnormal   Collection Time: 02/27/19  5:00 PM  Result Value Ref Range   Color, Urine YELLOW YELLOW   APPearance CLEAR CLEAR   Specific Gravity, Urine 1.010 1.005 - 1.030   pH 5.0 5.0 - 8.0   Glucose, UA NEGATIVE NEGATIVE mg/dL   Hgb urine dipstick NEGATIVE NEGATIVE   Bilirubin Urine NEGATIVE NEGATIVE   Ketones, ur NEGATIVE NEGATIVE mg/dL   Protein, ur NEGATIVE NEGATIVE mg/dL   Nitrite NEGATIVE NEGATIVE   Leukocytes,Ua TRACE (A) NEGATIVE   WBC, UA 0-5 0 - 5 WBC/hpf   Bacteria, UA NONE SEEN NONE SEEN    Comment: Performed at [facility] [address] [place], [place] [address]  CBG monitoring, ED     Status: Abnormal   Collection Time: 02/27/19  5:09 PM  Result Value Ref Range   Glucose-Capillary 126 (H) 70 - 99 mg/dL  Sodium, urine, random     Status: None   Collection Time: 02/27/19  5:41 PM  Result Value Ref Range   Sodium, Ur 55 mmol/L    Comment: Performed at [facility] [address] [place], [place] [address]  Creatinine, urine, random     Status: None   Collection Time: 02/27/19  5:41 PM  Result Value Ref  Range   Creatinine, Urine 109.56 mg/dL    Comment: Performed at [facility] [address] [place], [place] [address]  Hemoglobin A1c     Status: Abnormal   Collection Time: 02/27/19  7:04 PM  Result Value Ref Range   Hgb  A1c MFr Bld 6.1 (H) 4.8 - 5.6 %    Comment: (NOTE) Pre diabetes:          5.7%-6.4% Diabetes:              >6.4% Glycemic control for   <7.0% adults with diabetes    Mean Plasma Glucose 128.37 mg/dL    Comment: Performed at [facility], [place] [address] [place], [place] [address]   No results found.  Pending Labs Unresulted Labs (From admission, onward)    Start     Ordered   02/28/19 3546  Basic metabolic panel  Tomorrow morning,   R     02/27/19 1904   02/28/19 0500  CBC  Tomorrow morning,   R     02/27/19 1904   02/27/19 1903  TSH  Add-on,   R     02/27/19 1904   02/27/19 1900  HIV antibody (Routine Testing)  Once,   R     02/27/19 1904   02/27/19 1900  CBC  (heparin)  Once,   R    Comments:  Baseline for heparin therapy IF NOT ALREADY DRAWN.  Notify MD if PLT < 100 K.    02/27/19 1904   02/27/19 1900  Creatinine, serum  (heparin)  Once,   R    Comments:  Baseline for heparin therapy IF NOT ALREADY DRAWN.    02/27/19 1904   02/27/19 1824  SARS Coronavirus 2 (CEPHEID - Performed in [facility] hospital lab), Hosp Order  (Asymptomatic Patients Labs)  Once,   R    Question:  Rule Out  Answer:  Yes   02/27/19 1823          Vitals/Pain Today's Vitals   02/27/19 1845 02/27/19 1858 02/27/19 1930 02/27/19 1941  BP: (!) 137/49  (!) 130/44 (!) 130/53  Pulse:   (!) 56 (!) 47  Resp: 14  18 16   Temp:    97.9 F (36.6 C)  TempSrc:    Oral  SpO2:   100% 97%  Weight:      Height:      PainSc:  0-No pain  0-No pain    Isolation Precautions No active isolations  Medications Medications  sodium chloride flush (NS) 0.9 % injection 3 mL (has no administration in time range)  sodium chloride flush (NS) 0.9 % injection 3 mL (has no  administration in time range)  acetaminophen (TYLENOL) tablet 650 mg (has no administration in time range)    Or  acetaminophen (TYLENOL) suppository 650 mg (has no administration in time range)  oxyCODONE (Oxy IR/ROXICODONE) immediate release tablet 5 mg (has no administration in time range)  zolpidem (AMBIEN) tablet 5 mg (has no administration in time range)  polyethylene glycol (MIRALAX / GLYCOLAX) packet 17 g (has no administration in time range)  amiodarone (PACERONE) tablet 200 mg (has no administration in time range)  apixaban (ELIQUIS) tablet 5 mg (has no administration in time range)  aspirin EC tablet 81 mg (has no administration in time range)  atorvastatin (LIPITOR) tablet 40 mg (has no administration in time range)  carvedilol (COREG) tablet 25 mg (has no administration in time range)  Cholecalciferol 1,000 Units (has no administration in time range)  fluticasone furoate-vilanterol (BREO ELLIPTA) 200-25 MCG/INH 1 puff (has no administration in time range)  magnesium oxide (MAG-OX) tablet 200 mg (has no administration in time range)  pantoprazole (PROTONIX) EC tablet 40 mg (has no administration in time range)  sodium chloride 0.9 %  bolus 500 mL (0 mLs Intravenous Stopped 02/27/19 1851)    Mobility walks Low fall risk   Focused Assessments Pt being admitted for Acute Kidney Failure   R Recommendations: See Admitting Provider Note  Report given to:   Additional Notes:

## 2019-02-27 NOTE — Progress Notes (Signed)
New Admission Note:  Arrival Method: Via stretcher from ED Mental Orientation: Alert & Oriented x4 Telemetry: CCMD verified  Assessment: Completed Skin: Refer to flowsheet IV: Left Hand Pain: 0/10 Safety Measures: Safety Fall Prevention Plan discussed with patient. Admission: Completed [facility] [place] Orientation: Patient has been orientated to the room, unit and the staff.  Orders have been reviewed and are being implemented. Will continue to monitor the patient. Call light has been placed within reach.   Vassie Moselle, RN  Phone Number: [phone]

## 2019-02-27 NOTE — H&P (Signed)
History and Physical    Dean Snee Sr. ZSW:[id number] DOB: May 29, 1951 DOA: 02/27/2019  PCP: Dean Panda, MD  Patient coming from: Home  I have personally briefly reviewed patient's old medical records in [place]  Chief Complaint: This weakness over the past 3 weeks  HPI: Dean Desmith Sr. is a 68 y.o. male with medical history significant of medical problems including AICD in place, cardiac arrest, congestive heart failure, coronary artery disease, ischemic cardiomyopathy, obstructive sleep apnea on CPAP, type 2 diabetes, chronic kidney disease stage III, sustained ventricular tachycardia and anoxic brain injury who presents to the emergency department complaining of weakness over the past 3 weeks.  States that his weakness comes and goes and has been insidious in onset.  He does not follow with nephrology but is carefully monitored by his PCP.  He saw his PCP last week but forgot to mention this to him called him later in the week to let him know that he has been having the weakness.  He was told yesterday that his kidney function is worsening based on blood work done at the office.  He has a history of CHF he takes his Lasix and metolazone daily and reports he is very compliant with his medications.  Does not feel as though his fluid volume is up.  He is making urine and reports good urine output.  He has no dyspnea on exertion or leg swelling.  No chest pain no shortness of breath no palpitations.  He has had no fevers, chills, cough, shortness of breath, he denies confusion, lightheadedness, or dizziness.  He has had some diarrhea for a week but no change in his appetite.  He has had no abdominal pain nausea or vomiting.  Reports that he has not been using his CPAP for quite some time because he just felt like he did not need it anymore.  He states he sleeps fine.  ED Course: Fully evaluated and found to have creatinine of 5.92 up from 1.6 on September 19, 2018.  Potassium is 4.7.   Urine sediment is bland.  Patient referred to me for further evaluation and management.  Review of Systems: As per HPI otherwise all other systems reviewed and  negative.   Past Medical History:  Diagnosis Date   AICD (automatic cardioverter/defibrillator) present 10/10/2016   Arthritis    "maybe in his spine" (09/05/2016)   Asthma    pt. denies   Cardiac arrest ([place]) 08/27/2016   REQUIRING CPR, SHOCK & MEDICATIONS   CHF (congestive heart failure) (HCC)    Coronary artery disease    90% ostRCA stenosis and total occlusion of dRCA with L-->R collaterals 2017   Dysrhythmia    Gout    High cholesterol    Hypertension    Ischemic cardiomyopathy    LBBB (left bundle branch block)    OSA on CPAP    Type II diabetes mellitus ([place])     Past Surgical History:  Procedure Laterality Date   CARDIAC CATHETERIZATION N/A 09/07/2016   Procedure: Left Heart Cath and Coronary Angiography;  Surgeon: Peter M Martinique, MD;  Location: [facility];  Service: Cardiovascular;  Laterality: N/A;   COLONOSCOPY WITH PROPOFOL N/A 08/30/2018   Procedure: COLONOSCOPY WITH PROPOFOL;  Surgeon: Carol Ada, MD;  [facility];  Service: Endoscopy;  Laterality: N/A;   EP IMPLANTABLE DEVICE N/A 10/10/2016   Procedure: ICD Implant;  Surgeon: Will Meredith Leeds, MD;  Location: [facility];  Service: Cardiovascular;  Laterality: N/A;   LUMBAR LAMINECTOMY/DECOMPRESSION MICRODISCECTOMY N/A 01/18/2018   Procedure: L2-3, L3-4, L-4-5 CENTRAL DECOMPRESSION;  Surgeon: [name], MD;  Location: [facility];  Service: Orthopedics;  Laterality: N/A;   POLYPECTOMY  08/30/2018   Procedure: POLYPECTOMY;  Surgeon: Carol Ada, MD;  [facility];  Service: Endoscopy;;    Social History   Social History Narrative   Not on file     reports that he has never smoked. He has never used smokeless tobacco. He reports that he does not drink alcohol or use drugs.  Allergies    Allergen Reactions   Contrast Media [Iodinated Diagnostic Agents] Shortness Of Breath and Nausea And Vomiting   Lisinopril Cough    Family History  Problem Relation Age of Onset   Heart attack Father        died in his 53's   Hypertension Sister    Cancer Brother        uncertain, "leg"   Hypertension Sister      Prior to Admission medications   Medication Sig Start Date End Date Taking? Authorizing Provider  allopurinol (ZYLOPRIM) 300 MG tablet Take 300 mg by mouth daily. 08/16/16  Yes [provider]  amiodarone (PACERONE) 200 MG tablet Take 1 tablet (200 mg total) by mouth daily. 02/18/19  Yes Camnitz, Ocie Doyne, MD  apixaban (ELIQUIS) 5 MG TABS tablet Take 1 tablet (5 mg total) by mouth 2 (two) times daily. 12/19/18  Yes Camnitz, Will Hassell Done, MD  atorvastatin (LIPITOR) 40 MG tablet Take 1 tablet (40 mg total) by mouth daily at 6 PM. 10/27/16  Yes Camnitz, Will Hassell Done, MD  carvedilol (COREG) 25 MG tablet Take 1 tablet (25 mg total) by mouth 2 (two) times daily with a meal. 05/31/18  Yes Duke, Tami Lin, PA  CVS VITAMIN D3 1000 units capsule Take 1,000 Units by mouth daily. 08/16/16  Yes [provider]  furosemide (LASIX) 40 MG tablet Take 1.5 tablets (60 mg total) by mouth 2 (two) times daily. 10/29/18  Yes Camnitz, Ocie Doyne, MD  magnesium oxide (MAG-OX) 400 MG tablet Take 200 mg by mouth 2 (two) times daily.   Yes [provider]  aspirin EC 81 MG tablet Take 1 tablet (81 mg total) by mouth daily. Patient not taking: Reported on 02/27/2019 10/14/18   [name], MD  fluticasone furoate-vilanterol (BREO ELLIPTA) 200-25 MCG/INH AEPB Inhale 1 puff into the lungs daily. Patient not taking: Reported on 02/27/2019 07/24/18   Marshell Garfinkel, MD  metolazone (ZAROXOLYN) 2.5 MG tablet TAKE 1 TABLET BY MOUTH EVERY DAY Patient taking differently: Take 2.5 mg by mouth daily.  11/13/18   Tommie Raymond, NP  pantoprazole (PROTONIX) 40 MG tablet Take 1  tablet (40 mg total) by mouth daily. 07/01/18   Duke, Tami Lin, PA  sacubitril-valsartan (ENTRESTO) 49-51 MG Take 1 tablet by mouth 2 (two) times daily. 12/19/18   [name], MD    Physical Exam:  Constitutional: NAD, calm, comfortable Vitals:   02/27/19 1532 02/27/19 1535 02/27/19 1715 02/27/19 1800  BP: (!) 138/46  (!) 131/44 (!) 133/46  Pulse: 66  (!) 58 (!) 58  Resp: 17  15 17   Temp: 98.4 F (36.9 C)     TempSrc: Oral     SpO2: 100%  99% 99%  Weight:  111.1 kg    Height:  5\' 6"  (1.676 m)     Eyes: PERRL, lids and conjunctivae normal ENMT: Mucous membranes are moist. Posterior pharynx  clear of any exudate or lesions.Normal dentition.  Neck: normal, supple, no masses, no thyromegaly Respiratory: clear to auscultation bilaterally, no wheezing, no crackles. Normal respiratory effort. No accessory muscle use.  Cardiovascular: Regular rate and rhythm, no murmurs / rubs / gallops. No extremity edema. 2+ pedal pulses. No carotid bruits.  Abdomen: no tenderness, no masses palpated. No hepatosplenomegaly. Bowel sounds positive.  Musculoskeletal: no clubbing / cyanosis. No joint deformity upper and lower extremities. Good ROM, no contractures. Normal muscle tone.  Skin: no rashes, lesions, ulcers. No induration Neurologic: CN 2-12 grossly intact. Sensation intact, DTR normal. Strength 5/5 in all 4.  Psychiatric: Normal judgment and insight. Alert and oriented x 3. Normal mood.    Labs on Admission: I have personally reviewed following labs and imaging studies  CBC: Recent Labs  Lab 02/27/19 1537  WBC 12.3*  HGB 11.6*  HCT 35.0*  MCV 85.2  PLT 062   Basic Metabolic Panel: Recent Labs  Lab 02/27/19 1537  NA 131*  K 4.7  CL 97*  CO2 20*  GLUCOSE 114*  BUN 135*  CREATININE 5.92*  CALCIUM 10.1   GFR: Estimated Creatinine Clearance: 13.8 mL/min (A) (by C-G formula based on SCr of 5.92 mg/dL (H)). CBG: Recent Labs  Lab 02/27/19 1709  GLUCAP 126*   Urine  analysis:    Component Value Date/Time   COLORURINE YELLOW 02/27/2019 1700   APPEARANCEUR CLEAR 02/27/2019 1700   LABSPEC 1.010 02/27/2019 1700   PHURINE 5.0 02/27/2019 1700   GLUCOSEU NEGATIVE 02/27/2019 1700   HGBUR NEGATIVE 02/27/2019 1700   BILIRUBINUR NEGATIVE 02/27/2019 1700   KETONESUR NEGATIVE 02/27/2019 1700   PROTEINUR NEGATIVE 02/27/2019 1700   NITRITE NEGATIVE 02/27/2019 1700   LEUKOCYTESUR TRACE (A) 02/27/2019 1700    Radiological Exams on Admission: No results found.  EKG: Independently reviewed.  Degree AV block, left bundle branch block, abnormal EKG with widened QRS but no significant change when compared to prior  Assessment/Plan Principal Problem:   Acute kidney failure (HCC) Active Problems:   OSA (obstructive sleep apnea)   Stage 3 chronic kidney disease (HCC)   DM2 (diabetes mellitus, type 2) (HCC)   Essential hypertension, benign   Chronic combined systolic and diastolic CHF (congestive heart failure) ([place])   Morbid obesity ([place])    1.  Acute kidney failure: Patient will be admitted into the hospital we will hold his diuretics and allow him a 2000 mL fluid restriction.  This should allow him to gently bring his renal failure under control and improve his creatinine.  I have consulted Dr. Joelyn Oms from nephrology.  2.  Obstructive sleep apnea: Patient has not been very compliant with his CPAP machine I encouraged him to use it and educated him on the importance of using the CPAP machine to prevent brain cell loss and dementia as well as to prevent pulmonary hypertension.  He is going to use it at home and I have ordered it for use here.  3.  Stage III chronic kidney disease: Significantly worsened over the past 6 months.  Nephrology has been consulted.  Will hold nephrotoxic agents.  4.  Diabetes type 2: Noted will start sliding scale coverage etc.  5.  Essential hypertension: Blood pressure surprisingly well controlled despite massive increase in  creatinine.  We will continue home medications and monitor.  6.  Chronic combined systolic and diastolic congestive heart failure: Currently well compensated as I believe the patient is dry.  Will hold Lasix gingerly and monitor.  7.  Morbid obesity: Noted dietary measures encouraged.  DVT prophylaxis: Subcu heparin due to renal failure Code Status: Full code Family Communication: Patient did not request anyone be called Disposition Plan: Likely home in 3 to 4 days Consults called:Dr.Sanford from nephrology notified by text okay to see in a.m. Admission status: Inpatient  Admit - It is my clinical opinion that admission to INPATIENT is reasonable and necessary because of the expectation that this patient will require hospital care that crosses at least 2 midnights to treat this condition based on the medical complexity of the problems presented.  Given the aforementioned information, the predictability of an adverse outcome is felt to be significant.   Lady Deutscher MD FACP [facility] Pager [phone]  How to contact the [facility] Attending or Consulting provider [place] or covering provider during after hours [place], for this patient?  1. Check the care team in [facility] and look for a) attending/consulting [facility] provider listed and b) the [facility] team listed 2. Log into [email or web address] and use [place]'s universal password to access. If you do not have the password, please contact the hospital operator. 3. Locate the [facility] provider you are looking for under [facility] and page to a number that you can be directly reached. 4. If you still have difficulty reaching the provider, please page the [facility] (Director on Call) for the Hospitalists listed on amion for assistance.  If 7PM-7AM, please contact night-coverage [email or web address] Password [facility]  02/27/2019, 6:52 PM

## 2019-02-27 NOTE — ED Provider Notes (Signed)
[facility] EMERGENCY DEPARTMENT Provider Note   CSN: [id number] Arrival date & time: 02/27/19  1458  History   Chief Complaint Chief Complaint  Patient presents with  . Weakness   HPI Dean Dais Sr. is a 68 y.o. male p/w weakness and feeling tired x 3 weeks.  Weakness comes and goes.  He does not follow with nephrology but his creatinine was being monitored carefully by his PCP.  He was told yesterday that his kidney function is worsening.   He has h/o CHF and takes lasix and metolazone daily, reports he is compliant with his medications and takes three tabs of lasix a day.  He does not feel as though his fluid volume is up.  He makes urine and reports good output on this dose.  Denies dyspnea on exertion and leg swelling.  No CP SOB or palpitations.   Denies fever, chills, cough.  No confusion, lightheadedness or dizziness.  He reports diarrhea for 1 week but no changes in appetite. No abdominal pain, nausea or vomiting.   Past Medical History:  Diagnosis Date  . 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 Arthritis    "maybe in his spine" (09/05/2016)  . Asthma    pt. denies  . Cardiac arrest ([place]) 08/27/2016   REQUIRING CPR, SHOCK & MEDICATIONS  . CHF (congestive heart failure) ([place])   . Coronary artery disease    90% ostRCA stenosis and total occlusion of dRCA with L-->R collaterals 2017  . Dysrhythmia   . Gout   . High cholesterol   . Hypertension   . Ischemic cardiomyopathy   . LBBB (left bundle branch block)   . OSA on CPAP   . Type II diabetes mellitus [facility])    Patient Active Problem List   Diagnosis Date Noted  . CKD (chronic kidney disease), stage III ([place]) 05/16/2018  . Normochromic normocytic anemia 05/15/2018  . CAD (coronary artery disease) 12/17/2017  . Hyperlipidemia LDL goal <70 12/17/2017  . 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 Cardiomyopathy, ischemic   . Debility 10/04/2016  .  Stage 3 chronic kidney disease ([place])   . Disorientated   . Anoxic brain injury ([place]) 09/15/2016  . Benign essential HTN   . OSA (obstructive sleep apnea)   . Transaminitis   . Chronic respiratory failure with hypoxia ([place])   . NSVT (nonsustained ventricular tachycardia) ([place])   . Uncomplicated asthma   . Diabetes mellitus type 2 in obese ([place])   . Acute on chronic combined systolic and diastolic CHF (congestive heart failure) ([place])   . Tachypnea   . Hypokalemia   . Leukocytosis   . Hypoxemia   . Acute respiratory failure ([place])   . Cardiopulmonary arrest ([place]) 08/27/2016  . DM2 (diabetes mellitus, type 2) ([place]) 03/02/2014  . Morbid obesity ([place]) 03/02/2014  . Essential hypertension, benign 03/02/2014   Past Surgical History:  Procedure Laterality Date  . CARDIAC CATHETERIZATION N/A 09/07/2016   Procedure: Left Heart Cath and Coronary Angiography;  Surgeon: Peter M Martinique, MD;  Location: [place] [facility];  Service: Cardiovascular;  Laterality: N/A;  . COLONOSCOPY WITH PROPOFOL N/A 08/30/2018   Procedure: COLONOSCOPY WITH PROPOFOL;  Surgeon: Carol Ada, MD;  [facility];  Service: Endoscopy;  Laterality: N/A;  . EP IMPLANTABLE DEVICE N/A 10/10/2016   Procedure: ICD Implant;  Surgeon: Will Meredith Leeds, MD;  Location: [place] [facility];  Service: Cardiovascular;  Laterality: N/A;  . LUMBAR LAMINECTOMY/DECOMPRESSION MICRODISCECTOMY N/A 01/18/2018  Procedure: L2-3, L3-4, L-4-5 CENTRAL DECOMPRESSION;  Surgeon: [name], MD;  Location: [facility];  Service: Orthopedics;  Laterality: N/A;  . POLYPECTOMY  08/30/2018   Procedure: POLYPECTOMY;  Surgeon: Carol Ada, MD;  [facility];  Service: Endoscopy;;      Home Medications    Prior to Admission medications   Medication Sig Start Date End Date Taking? Authorizing Provider  allopurinol (ZYLOPRIM) 300 MG tablet Take 300 mg by mouth daily. 08/16/16   [provider]  amiodarone (PACERONE) 200 MG  tablet Take 1 tablet (200 mg total) by mouth daily. 02/18/19   Camnitz, Ocie Doyne, MD  apixaban (ELIQUIS) 5 MG TABS tablet Take 1 tablet (5 mg total) by mouth 2 (two) times daily. 12/19/18   Camnitz, Ocie Doyne, MD  aspirin EC 81 MG tablet Take 1 tablet (81 mg total) by mouth daily. 10/14/18   Camnitz, Ocie Doyne, MD  atorvastatin (LIPITOR) 40 MG tablet Take 1 tablet (40 mg total) by mouth daily at 6 PM. 10/27/16   Camnitz, Ocie Doyne, MD  carvedilol (COREG) 25 MG tablet Take 1 tablet (25 mg total) by mouth 2 (two) times daily with a meal. 05/31/18   Duke, Tami Lin, PA  CVS VITAMIN D3 1000 units capsule Take 1,000 Units by mouth daily. 08/16/16   [provider]  fluticasone furoate-vilanterol (BREO ELLIPTA) 200-25 MCG/INH AEPB Inhale 1 puff into the lungs daily. 07/24/18   Mannam, Hart Robinsons, MD  furosemide (LASIX) 40 MG tablet Take 1.5 tablets (60 mg total) by mouth 2 (two) times daily. 10/29/18   Camnitz, Ocie Doyne, MD  magnesium oxide (MAG-OX) 400 MG tablet Take 200 mg by mouth 2 (two) times daily.    [provider]  metolazone (ZAROXOLYN) 2.5 MG tablet TAKE 1 TABLET BY MOUTH EVERY DAY 11/13/18   Kathyrn Drown D, NP  pantoprazole (PROTONIX) 40 MG tablet Take 1 tablet (40 mg total) by mouth daily. 07/01/18   Duke, Tami Lin, PA  sacubitril-valsartan (ENTRESTO) 49-51 MG Take 1 tablet by mouth 2 (two) times daily. 12/19/18   Camnitz, Ocie Doyne, MD   Family History Family History  Problem Relation Age of Onset  . Heart attack Father        died in his 6's  . Hypertension Sister   . Cancer Brother        uncertain, "leg"  . Hypertension Sister     Social History Social History   Tobacco Use  . Smoking status: Never Smoker  . Smokeless tobacco: Never Used  Substance Use Topics  . Alcohol use: No  . Drug use: No   Allergies   Contrast media [iodinated diagnostic agents] and Lisinopril  Review of Systems Review of Systems  Constitutional: Negative for  appetite change, chills and fever.  HENT: Negative for congestion, rhinorrhea and sore throat.   Respiratory: Negative for cough and shortness of breath.   Cardiovascular: Negative for chest pain and palpitations.  Gastrointestinal: Positive for diarrhea. Negative for abdominal pain, constipation, nausea and vomiting.  Genitourinary: Negative for difficulty urinating and flank pain.  Neurological: Positive for weakness. Negative for dizziness, tremors, light-headedness and headaches.  Psychiatric/Behavioral: Negative for confusion.   Physical Exam Updated Vital Signs BP (!) 133/46   Pulse (!) 58   Temp 98.4 F (36.9 C) (Oral)   Resp 17   Ht 5\' 6"  (1.676 m)   Wt 111.1 kg   SpO2 99%   BMI 39.54 kg/m   Physical Exam Constitutional:  General: He is not in acute distress.    Appearance: Normal appearance. He is not diaphoretic.  HENT:     Head: Normocephalic and atraumatic.     Nose: Nose normal.     Mouth/Throat:     Mouth: Mucous membranes are dry.  Eyes:     Extraocular Movements: Extraocular movements intact.     Pupils: Pupils are equal, round, and reactive to light.  Neck:     Musculoskeletal: Neck supple.  Cardiovascular:     Rate and Rhythm: Normal rate and regular rhythm.     Heart sounds: No murmur. No gallop.   Pulmonary:     Effort: Pulmonary effort is normal. No respiratory distress.     Breath sounds: No wheezing.  Abdominal:     Palpations: Abdomen is soft.     Tenderness: There is no abdominal tenderness. There is no guarding.  Musculoskeletal:        General: No swelling.  Skin:    General: Skin is warm and dry.  Neurological:     General: No focal deficit present.     Mental Status: He is alert and oriented to person, place, and time.     Cranial Nerves: No cranial nerve deficit.     Sensory: No sensory deficit.     Motor: No weakness.  Psychiatric:        Mood and Affect: Mood normal.    ED Treatments / Results  Labs (all labs ordered are  listed, but only abnormal results are displayed) Labs Reviewed  BASIC METABOLIC PANEL - Abnormal; Notable for the following components:      Result Value   Sodium 131 (*)    Chloride 97 (*)    CO2 20 (*)    Glucose, Bld 114 (*)    BUN 135 (*)    Creatinine, Ser 5.92 (*)    GFR calc non Af Amer 9 (*)    GFR calc Af Amer 10 (*)    All other components within normal limits  CBC - Abnormal; Notable for the following components:   WBC 12.3 (*)    RBC 4.11 (*)    Hemoglobin 11.6 (*)    HCT 35.0 (*)    All other components within normal limits  URINALYSIS, ROUTINE W REFLEX MICROSCOPIC - Abnormal; Notable for the following components:   Leukocytes,Ua TRACE (*)    All other components within normal limits  CBG MONITORING, ED - Abnormal; Notable for the following components:   Glucose-Capillary 126 (*)    All other components within normal limits  SARS CORONAVIRUS 2 (HOSPITAL ORDER, [place] [facility])  SODIUM, URINE, RANDOM  CREATININE, URINE, RANDOM    EKG EKG Interpretation  Date/Time:  Thursday February 27 2019 15:41:02 EDT Ventricular Rate:  66 PR Interval:  246 QRS Duration: 166 QT Interval:  472 QTC Calculation: 494 R Axis:   -15 Text Interpretation:  Sinus rhythm with 1st degree A-V block Left bundle branch block Abnormal ECG widened qrs Otherwise no significant change Confirmed by [name] [id number]) on 02/27/2019 4:34:05 PM  Radiology No results found.  Procedures Procedures (including critical care time)  Medications Ordered in ED Medications  sodium chloride flush (NS) 0.9 % injection 3 mL (has no administration in time range)  sodium chloride 0.9 % bolus 500 mL (500 mLs Intravenous New Bag/Given 02/27/19 1817)   Initial Impression / Assessment and Plan / ED Course   68 yo male with PMH significant for T2DM, HTN,  obesity, OSA and CHF p/w weakness and fatigue over last several weeks likely 2/2 acute renal failure.  Vitals stable and no respiratory  distress.  Labs remarkable for serum creatinine of 5.9 up from 1.6 five months ago  but otherwise within normal limit.  He appears somewhat dry on exam and per history does not feel he is volume overloaded from his baseline on home CHF regimen. Suspect may be due to dehydration as he reports diarrhea symptoms x 1 week.  IV bolus 500 cc ordered and urine studies ordered to determine if prerenal in etiology.    Bladder scan pending to check postvoid residual.  Discussed with hospitalist, will admit for further workup and nephrology input.    I have reviewed the triage vital signs and the nursing notes.  Pertinent labs & imaging results that were available during my care of the patient were reviewed by me and considered in my medical decision making (see chart for details).  Final Clinical Impressions(s) / ED Diagnoses   Final diagnoses:  Acute renal failure, unspecified acute renal failure type [facility])   ED Discharge Orders    None     Lovenia Kim MD [place] PGY3    Lovenia Kim, MD 02/27/19 [place], [place], DO 02/27/19 1858

## 2019-02-27 NOTE — ED Notes (Signed)
Attempted report 

## 2019-02-27 NOTE — ED Triage Notes (Signed)
Pt reports he has not been feeling well with weakness for 3 weeks now.  He has a defibrillator in place.  He states speaking to his PCP and was told that his kidney function has been declining.  He is A&Ox 4.  He denies any cp, sob, or dizziness.

## 2019-02-27 NOTE — Progress Notes (Signed)
Report received 

## 2019-02-28 ENCOUNTER — Inpatient Hospital Stay (HOSPITAL_COMMUNITY): Payer: Medicare HMO

## 2019-02-28 ENCOUNTER — Other Ambulatory Visit (HOSPITAL_COMMUNITY): Payer: Medicare HMO

## 2019-02-28 DIAGNOSIS — N179 Acute kidney failure, unspecified: Principal | ICD-10-CM

## 2019-02-28 DIAGNOSIS — I10 Essential (primary) hypertension: Secondary | ICD-10-CM

## 2019-02-28 DIAGNOSIS — E1122 Type 2 diabetes mellitus with diabetic chronic kidney disease: Secondary | ICD-10-CM

## 2019-02-28 DIAGNOSIS — E871 Hypo-osmolality and hyponatremia: Secondary | ICD-10-CM

## 2019-02-28 DIAGNOSIS — N183 Chronic kidney disease, stage 3 (moderate): Secondary | ICD-10-CM

## 2019-02-28 DIAGNOSIS — G4733 Obstructive sleep apnea (adult) (pediatric): Secondary | ICD-10-CM

## 2019-02-28 LAB — BASIC METABOLIC PANEL
Anion gap: 13 (ref 5–15)
Anion gap: 12 (ref 5–15)
BUN: 131 mg/dL — ABNORMAL HIGH (ref 8–23)
BUN: 119 mg/dL — ABNORMAL HIGH (ref 8–23)
CO2: 21 mmol/L — ABNORMAL LOW (ref 22–32)
CO2: 20 mmol/L — ABNORMAL LOW (ref 22–32)
Calcium: 10.3 mg/dL (ref 8.9–10.3)
Calcium: 9.8 mg/dL (ref 8.9–10.3)
Chloride: 98 mmol/L (ref 98–111)
Chloride: 98 mmol/L (ref 98–111)
Creatinine, Ser: 5.46 mg/dL — ABNORMAL HIGH (ref 0.61–1.24)
Creatinine, Ser: 4.71 mg/dL — ABNORMAL HIGH (ref 0.61–1.24)
GFR calc Af Amer: 11 mL/min — ABNORMAL LOW (ref >60)
GFR calc Af Amer: 14 mL/min — ABNORMAL LOW (ref >60)
GFR calc non Af Amer: 10 mL/min — ABNORMAL LOW (ref >60)
GFR calc non Af Amer: 12 mL/min — ABNORMAL LOW (ref >60)
Glucose, Bld: 90 mg/dL (ref 70–99)
Glucose, Bld: 148 mg/dL — ABNORMAL HIGH (ref 70–99)
Potassium: 4.4 mmol/L (ref 3.5–5.1)
Potassium: 4.8 mmol/L (ref 3.5–5.1)
Sodium: 132 mmol/L — ABNORMAL LOW (ref 135–145)
Sodium: 130 mmol/L — ABNORMAL LOW (ref 135–145)

## 2019-02-28 LAB — CBC
HCT: 37.7 % — ABNORMAL LOW (ref 39.0–52.0)
Hemoglobin: 12.4 g/dL — ABNORMAL LOW (ref 13.0–17.0)
MCH: 28.1 pg (ref 26.0–34.0)
MCHC: 32.9 g/dL (ref 30.0–36.0)
MCV: 85.5 fL (ref 80.0–100.0)
Platelets: 171 10*3/uL (ref 150–400)
RBC: 4.41 MIL/uL (ref 4.22–5.81)
RDW: 14.1 % (ref 11.5–15.5)
WBC: 10.7 10*3/uL — ABNORMAL HIGH (ref 4.0–10.5)
nRBC: 0 % (ref 0.0–0.2)

## 2019-02-28 LAB — TSH: TSH: 2.943 u[IU]/mL (ref 0.350–4.500)

## 2019-02-28 LAB — HIV ANTIBODY (ROUTINE TESTING W REFLEX): HIV Screen 4th Generation wRfx: NONREACTIVE

## 2019-02-28 MED ORDER — LACTATED RINGERS IV SOLN
INTRAVENOUS | Status: DC
  Administered 2019-02-28 – 2019-03-01 (×2): via INTRAVENOUS

## 2019-02-28 NOTE — Progress Notes (Addendum)
PROGRESS NOTE   Dean Bones Sr.  VQX:[id number]    DOB: 1951-04-20    DOA: 02/27/2019  PCP: [name], MD   I have briefly reviewed patients previous medical records in [facility].  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  Lab work a week PTA showed worsening creatinine and patient advised to come to ED.  In ED creatinine 5.92 up from 1.6 on 09/19/2018.  Admitted for acute on chronic renal failure.  Nephrology consulting.   Assessment & Plan:   Principal Problem:   Acute kidney failure (HCC) Active Problems:   DM2 (diabetes mellitus, type 2) ([place])   Morbid obesity ([place])   Essential hypertension, benign   OSA (obstructive sleep apnea)   Stage 3 chronic kidney disease (HCC)   Chronic combined systolic and diastolic CHF (congestive heart failure) (HCC)   Acute renal failure on stage III chronic kidney disease.  Baseline creatinine 1.6-2.  Presented with creatinine of 5.92 and BUN of 135.  Suspect due to hypovolemia from GI losses, ongoing diuretic use and Entresto.  UA without evidence of GN.  Renal ultrasound without hydronephrosis.  SARS coronavirus 2: Negative.  No history of NSAID use or contrast exposure.  Hold furosemide, metolazone and Entresto.  IV fluids/LR at 75 mils per hour.  Creatinine has improved from 5.92-4.71.  Follow daily BMP.  Dehydration with hyponatremia  Secondary to GI losses and diuretics.  Temporarily hold diuretics.  Continue IV fluids as noted above.  Oral intake as tolerated.  DM type II with renal complications  Appears to be diet controlled at home and not on oral hypoglycemics.  A1c 6.1.  Monitor CBGs and SSI if  needed.  Essential hypertension  Continue carvedilol 25 mg twice daily.  Holding [place].  Hyperlipidemia  Continue atorvastatin  OSA  Noncompliant with CPAP.  Chronic systolic CHF/ICM/chronic LBBB/AICD/history of VT/VF arrest/CAD  Clinically appears dry.  Thereby diuretics temporarily on hold.  Continue carvedilol.  Follows with [facility] Cardiology as outpatient.  PAF  SR-SB on telemetry.  Continue amiodarone, carvedilol and apixaban.  Asthma  Stable.  Morbid obesity/Body mass index is 39.53 kg/m.    DVT prophylaxis: Subcutaneous heparin Code Status: Full Family Communication: None at bedside Disposition: DC home pending clinical improvement.   Consultants:  Nephrology  Procedures:  None  Antimicrobials:  None   Subjective: Overall feels better.  Feels stronger.  Denies complaints.  No dyspnea or pain reported.  No diarrhea reported.  ROS: As above, otherwise negative.  Objective:  Vitals:   02/28/19 0444 02/28/19 0500 02/28/19 0813 02/28/19 0916  BP: (!) 131/33   (!) 139/50  Pulse: (!) 55   60  Resp:    18  Temp: 97.7 F (36.5 C)   97.6 F (36.4 C)  TempSrc: Oral   Oral  SpO2: 100%  98% 100%  Weight:  111.1 kg    Height:        Examination:  General exam: Pleasant middle-age male, moderately built and obese lying comfortably supine in bed.  Oral mucosa dry. Respiratory system: Clear to auscultation. Respiratory effort normal. Cardiovascular system: S1 & S2 heard, RRR. No JVD, murmurs, rubs, gallops or clicks. No pedal edema.  Telemetry  personally reviewed: SR-SB in the 69s with BBB morphology.  On demand V paced rhythm at times. Gastrointestinal system: Abdomen is nondistended, soft and nontender. No organomegaly or masses felt. Normal bowel sounds heard. Central nervous system: Alert and oriented. No focal neurological deficits. Extremities: Symmetric 5 x 5 power. Skin: No rashes, lesions or ulcers Psychiatry: Judgement and insight  appear normal. Mood & affect appropriate.     Data Reviewed: I have personally reviewed following labs and imaging studies  CBC: Recent Labs  Lab 02/27/19 1537 02/28/19 0134  WBC 12.3* 10.7*  HGB 11.6* 12.4*  HCT 35.0* 37.7*  MCV 85.2 85.5  PLT 201 177   Basic Metabolic Panel: Recent Labs  Lab 02/27/19 1537 02/28/19 0134 02/28/19 1527  NA 131* 132* 130*  K 4.7 4.4 4.8  CL 97* 98 98  CO2 20* 21* 20*  GLUCOSE 114* 90 148*  BUN 135* 131* 119*  CREATININE 5.92* 5.46* 4.71*  CALCIUM 10.1 10.3 9.8   HbA1C: Recent Labs    02/27/19 1904  HGBA1C 6.1*   CBG: Recent Labs  Lab 02/27/19 1709 02/27/19 2037  GLUCAP 126* 100*    Recent Results (from the past 240 hour(s))  SARS Coronavirus 2 (CEPHEID - Performed in [place] hospital lab), Hosp Order     Status: None   Collection Time: 02/27/19  7:14 PM  Result Value Ref Range Status   SARS Coronavirus 2 NEGATIVE NEGATIVE Final    Comment: (NOTE) If result is NEGATIVE SARS-CoV-2 target nucleic acids are NOT DETECTED. The SARS-CoV-2 RNA is generally detectable in upper and lower  respiratory specimens during the acute phase of infection. The lowest  concentration of SARS-CoV-2 viral copies this assay can detect is 250  copies / mL. A negative result does not preclude SARS-CoV-2 infection  and should not be used as the sole basis for treatment or other  patient management decisions.  A negative result may occur with  improper specimen collection / handling, submission of specimen other  than nasopharyngeal swab, presence of viral mutation(s) within the  areas targeted by this assay, and inadequate number of viral copies  (<250 copies / mL). A negative result must be combined with clinical  observations, patient history, and epidemiological information. If result is POSITIVE SARS-CoV-2 target nucleic acids are DETECTED. The SARS-CoV-2 RNA is generally detectable in upper and lower  respiratory specimens dur ing the  acute phase of infection.  Positive  results are indicative of active infection with SARS-CoV-2.  Clinical  correlation with patient history and other diagnostic information is  necessary to determine patient infection status.  Positive results do  not rule out bacterial infection or co-infection with other viruses. If result is PRESUMPTIVE POSTIVE SARS-CoV-2 nucleic acids MAY BE PRESENT.   A presumptive positive result was obtained on the submitted specimen  and confirmed on repeat testing.  While 2019 novel coronavirus  (SARS-CoV-2) nucleic acids may be present in the submitted sample  additional confirmatory testing may be necessary for epidemiological  and / or clinical management purposes  to differentiate between  SARS-CoV-2 and other Sarbecovirus currently known to infect humans.  If clinically indicated additional testing with an alternate test  methodology [phone]) is advised. The SARS-CoV-2 RNA is generally  detectable in upper and lower respiratory sp ecimens during the acute  phase of infection. The expected result is Negative. Fact Sheet for Patients:  StrictlyIdeas.no Fact Sheet for Healthcare Providers: BankingDealers.co.za This test is not yet approved or cleared by the [place]  States FDA and has been authorized for detection and/or diagnosis of SARS-CoV-2 by FDA under an Emergency Use Authorization (EUA).  This EUA will remain in effect (meaning this test can be used) for the duration of the COVID-19 declaration under Section 564(b)(1) of the Act, 21 U.S.C. section 360bbb-3(b)(1), unless the authorization is terminated or revoked sooner. Performed at [facility], [place] [address] [place], [place] [address]          Radiology Studies: US Renal  Result Date: 02/28/2019 CLINICAL DATA:  Acute kidney injury. EXAM: RENAL / URINARY TRACT ULTRASOUND COMPLETE COMPARISON:  MRI 09/19/2018. FINDINGS: Right Kidney: Renal  measurements: 10.7 x 5.6 x 5.2 cm = volume: 162.8 mL . Echogenicity within normal limits. No mass or hydronephrosis visualized. Left Kidney: Renal measurements: 9.8 x 6.2 x 6.0 cm = volume: 191.5 mL. Left kidney is difficult to visualize. Echogenicity within normal limits. No mass or hydronephrosis visualized. Bladder: Appears normal for degree of bladder distention. IMPRESSION: No acute abnormality identified.  No hydronephrosis. Electronically Signed   By: Marcello Moores  Register   On: 02/28/2019 13:28        Scheduled Meds: . amiodarone  200 mg Oral Daily  . apixaban  5 mg Oral BID  . atorvastatin  40 mg Oral q1800  . carvedilol  25 mg Oral BID WC  . cholecalciferol  1,000 Units Oral Daily  . fluticasone furoate-vilanterol  1 puff Inhalation Daily  . magnesium oxide  200 mg Oral BID  . pantoprazole  40 mg Oral Daily  . sodium chloride flush  3 mL Intravenous Once  . sodium chloride flush  3 mL Intravenous Q12H   Continuous Infusions: . lactated ringers 75 mL/hr at 02/28/19 1322     LOS: 1 day     Vernell Leep, MD, [place], [facility]. [facility]  To contact the attending provider between 7A-7P or the covering provider during after hours 7P-7A, please log into the web site [email or web address] and access using universal [place] password for that web site. If you do not have the password, please call the hospital operator.  02/28/2019, 4:32 PM

## 2019-02-28 NOTE — Telephone Encounter (Signed)
[email or web address]  (Dr. Curt Bears recommends discuss concerns w/ PCP)

## 2019-02-28 NOTE — Discharge Instructions (Addendum)
Information on my medicine - ELIQUIS (apixaban)  This medication education was reviewed with me or my healthcare representative as part of my discharge preparation.   Why was Eliquis prescribed for you? Eliquis was prescribed for you to reduce the risk of forming blood clots that can cause a stroke if you have a medical condition called atrial fibrillation (a type of irregular heartbeat) OR to reduce the risk of a blood clots forming after orthopedic surgery.  What do You need to know about Eliquis ? Take your Eliquis TWICE DAILY - one tablet in the morning and one tablet in the evening with or without food.  It would be best to take the doses about the same time each day.  If you have difficulty swallowing the tablet whole please discuss with your pharmacist how to take the medication safely.  Take Eliquis exactly as prescribed by your doctor and DO NOT stop taking Eliquis without talking to the doctor who prescribed the medication.  Stopping may increase your risk of developing a new clot or stroke.  Refill your prescription before you run out.  After discharge, you should have regular check-up appointments with your healthcare provider that is prescribing your Eliquis.  In the future your dose may need to be changed if your kidney function or weight changes by a significant amount or as you get older.  What do you do if you miss a dose? If you miss a dose, take it as soon as you remember on the same day and resume taking twice daily.  Do not take more than one dose of ELIQUIS at the same time.  Important Safety Information A possible side effect of Eliquis is bleeding. You should call your healthcare provider right away if you experience any of the following: Bleeding from an injury or your nose that does not stop. Unusual colored urine (red or dark brown) or unusual colored stools (red or black). Unusual bruising for unknown reasons. A serious fall or if you hit your head (even  if there is no bleeding).  Some medicines may interact with Eliquis and might increase your risk of bleeding or clotting while on Eliquis. To help avoid this, consult your healthcare provider or pharmacist prior to using any new prescription or non-prescription medications, including herbals, vitamins, non-steroidal anti-inflammatory drugs (NSAIDs) and supplements.  This website has more information on Eliquis (apixaban): www.Eliquis.com.    Additional discharge instructions  Please get your medications reviewed and adjusted by your Primary MD.  Please request your Primary MD to go over all Hospital Tests and Procedure/Radiological results at the follow up, please get all Hospital records sent to your Prim MD by signing hospital release before you go home.  If you had Pneumonia of Lung problems at the Hospital: Please get a 2 view Chest X ray done in 6-8 weeks after hospital discharge or sooner if instructed by your Primary MD.  If you have Congestive Heart Failure: Please call your Cardiologist or Primary MD anytime you have any of the following symptoms:  1) 3 pound weight gain in 24 hours or 5 pounds in 1 week  2) shortness of breath, with or without a dry hacking cough  3) swelling in the hands, feet or stomach  4) if you have to sleep on extra pillows at night in order to breathe  Follow cardiac low salt diet and 1.5 lit/day fluid restriction.  If you have diabetes Accuchecks 4 times/day, Once in AM empty stomach and then before each   meal. Log in all results and show them to your primary doctor at your next visit. If any glucose reading is under 80 or above 300 call your primary MD immediately.  If you have Seizure/Convulsions/Epilepsy: Please do not drive, operate heavy machinery, participate in activities at heights or participate in high speed sports until you have seen by Primary MD or a Neurologist and advised to do so again.  If you had Gastrointestinal  Bleeding: Please ask your Primary MD to check a complete blood count within one week of discharge or at your next visit. Your endoscopic/colonoscopic biopsies that are pending at the time of discharge, will also need to followed by your Primary MD.  Get Medicines reviewed and adjusted. Please take all your medications with you for your next visit with your Primary MD  Please request your Primary MD to go over all hospital tests and procedure/radiological results at the follow up, please ask your Primary MD to get all Hospital records sent to his/her office.  If you experience worsening of your admission symptoms, develop shortness of breath, life threatening emergency, suicidal or homicidal thoughts you must seek medical attention immediately by calling 911 or calling your MD immediately  if symptoms less severe.  You must read complete instructions/literature along with all the possible adverse reactions/side effects for all the Medicines you take and that have been prescribed to you. Take any new Medicines after you have completely understood and accpet all the possible adverse reactions/side effects.   Do not drive or operate heavy machinery when taking Pain medications.   Do not take more than prescribed Pain, Sleep and Anxiety Medications  Special Instructions: If you have smoked or chewed Tobacco  in the last 2 yrs please stop smoking, stop any regular Alcohol  and or any Recreational drug use.  Wear Seat belts while driving.  Please note You were cared for by a hospitalist during your hospital stay. If you have any questions about your discharge medications or the care you received while you were in the hospital after you are discharged, you can call the unit and asked to speak with the hospitalist on call if the hospitalist that took care of you is not available. Once you are discharged, your primary care physician will handle any further medical issues. Please note that NO REFILLS for  any discharge medications will be authorized once you are discharged, as it is imperative that you return to your primary care physician (or establish a relationship with a primary care physician if you do not have one) for your aftercare needs so that they can reassess your need for medications and monitor your lab values.  You can reach the hospitalist office at phone [phone] or fax [phone]   If you do not have a primary care physician, you can call [phone] for a physician referral.  

## 2019-02-28 NOTE — Consult Note (Signed)
Dean Bones Sr. Admit Date: 02/27/2019 02/28/2019 [place]ene Agent Requesting Physician:  Evangeline Gula MD  Reason for Consult:  AKI HPI:  68 year old male admitted overnight after presenting with several weeks of weakness and 1 week of intermittent diarrhea and poor oral intake.  Patient had blood work to evaluate this with PMD and was prompted to present to the emergency room.  PMH Incudes:  Chronic systolic heart failure from ischemic cardiomyopathy with history of cardiac arrest and status post AICD; LVEF 08/19 30 to 35% by TTE  Paroxysmal atrial fibrillation, on apixaban  CAD  DM2  CKD 3 baseline creatinine 1.6-2.0, does not follow with nephrology  OSA with infrequent use of CPAP  Patient denies use of nonsteroidals.  No recent contrast exposure.  Patient has continued to take his furosemide, metolazone, Entresto until presenting to the emergency room.  He denies any change in urinary output.  He states his appetite might be improving.  Upon presentation serum creatinine was 5.92 with BUN 135, potassium 4.7.  Patient has been hydrated and creatinine this morning was 5.46 with a BUN of 131 and potassium of 4.4.  Urine analysis without blood protein or leukocytes.  Patient without fevers and vital signs are largely stable.  No Foley catheter.  No renal imaging.  He denies dyspnea, PND, orthopnea.   Creatinine, Ser (mg/dL)  Date Value  02/28/2019 5.46 (H)  02/27/2019 5.92 (H)  09/19/2018 1.60 (H)  08/09/2018 2.02 (H)  07/25/2018 1.57 (H)  07/10/2018 1.12  06/07/2018 1.51 (H)  05/31/2018 2.08 (H)  05/21/2018 1.63 (H)  05/20/2018 1.66 (H)  ]  ROS NSAIDS: Denies IV Contrast no exposure TMP/SMX no exposure Balance of 12 systems is negative w/ exceptions as above  PMH  Past Medical History:  Diagnosis Date  . 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 Arthritis    "maybe in his spine" (09/05/2016)  . Asthma    pt. denies  . Cardiac arrest ([place])  08/27/2016   REQUIRING CPR, SHOCK & MEDICATIONS  . CHF (congestive heart failure) ([place])   . Coronary artery disease    90% ostRCA stenosis and total occlusion of dRCA with L-->R collaterals 2017  . Dysrhythmia   . Gout   . High cholesterol   . Hypertension   . Ischemic cardiomyopathy   . LBBB (left bundle branch block)   . OSA on CPAP   . Type II diabetes mellitus (HCC)    PSH  Past Surgical History:  Procedure Laterality Date  . CARDIAC CATHETERIZATION N/A 09/07/2016   Procedure: Left Heart Cath and Coronary Angiography;  Surgeon: Peter M Martinique, MD;  Location: [facility];  Service: Cardiovascular;  Laterality: N/A;  . COLONOSCOPY WITH PROPOFOL N/A 08/30/2018   Procedure: COLONOSCOPY WITH PROPOFOL;  Surgeon: Carol Ada, MD;  Location: [facility];  Service: Endoscopy;  Laterality: N/A;  . EP IMPLANTABLE DEVICE N/A 10/10/2016   Procedure: ICD Implant;  Surgeon: Will Meredith Leeds, MD;  Location: [facility];  Service: Cardiovascular;  Laterality: N/A;  . LUMBAR LAMINECTOMY/DECOMPRESSION MICRODISCECTOMY N/A 01/18/2018   Procedure: L2-3, L3-4, L-4-5 CENTRAL DECOMPRESSION;  Surgeon: [name], MD;  Location: [facility];  Service: Orthopedics;  Laterality: N/A;  . POLYPECTOMY  08/30/2018   Procedure: POLYPECTOMY;  Surgeon: Carol Ada, MD;  Location: [facility];  Service: Endoscopy;;   FH  Family History  Problem Relation Age of Onset  . Heart attack Father        died in his 40's  .  Hypertension Sister   . Cancer Brother        uncertain, "leg"  . Hypertension Sister    SH  reports that he has never smoked. He has never used smokeless tobacco. He reports that he does not drink alcohol or use drugs. Allergies  Allergies  Allergen Reactions  . Contrast Media [Iodinated Diagnostic Agents] Shortness Of Breath and Nausea And Vomiting  . Lisinopril Cough   Home medications Prior to Admission medications   Medication Sig Start Date End Date Taking? Authorizing  Provider  allopurinol (ZYLOPRIM) 300 MG tablet Take 300 mg by mouth daily. 08/16/16  Yes [provider]  amiodarone (PACERONE) 200 MG tablet Take 1 tablet (200 mg total) by mouth daily. 02/18/19  Yes Camnitz, Ocie Doyne, MD  apixaban (ELIQUIS) 5 MG TABS tablet Take 1 tablet (5 mg total) by mouth 2 (two) times daily. 12/19/18  Yes Camnitz, Will Hassell Done, MD  atorvastatin (LIPITOR) 40 MG tablet Take 1 tablet (40 mg total) by mouth daily at 6 PM. 10/27/16  Yes Camnitz, Will Hassell Done, MD  carvedilol (COREG) 25 MG tablet Take 1 tablet (25 mg total) by mouth 2 (two) times daily with a meal. 05/31/18  Yes Duke, Tami Lin, PA  [employer] VITAMIN D3 1000 units capsule Take 1,000 Units by mouth daily. 08/16/16  Yes [provider]  furosemide (LASIX) 40 MG tablet Take 1.5 tablets (60 mg total) by mouth 2 (two) times daily. 10/29/18  Yes Camnitz, Ocie Doyne, MD  magnesium oxide (MAG-OX) 400 MG tablet Take 200 mg by mouth 2 (two) times daily.   Yes [provider]  metolazone (ZAROXOLYN) 2.5 MG tablet TAKE 1 TABLET BY MOUTH EVERY DAY Patient taking differently: Take 2.5 mg by mouth daily.  11/13/18  Yes Kathyrn Drown D, NP  pantoprazole (PROTONIX) 40 MG tablet Take 1 tablet (40 mg total) by mouth daily. 07/01/18  Yes Duke, Tami Lin, PA  sacubitril-valsartan (ENTRESTO) 49-51 MG Take 1 tablet by mouth 2 (two) times daily. 12/19/18  Yes Camnitz, Ocie Doyne, MD  aspirin EC 81 MG tablet Take 1 tablet (81 mg total) by mouth daily. Patient not taking: Reported on 02/27/2019 10/14/18   [name], MD  fluticasone furoate-vilanterol (BREO ELLIPTA) 200-25 MCG/INH AEPB Inhale 1 puff into the lungs daily. Patient not taking: Reported on 02/27/2019 07/24/18   Marshell Garfinkel, MD    Current Medications Scheduled Meds: . amiodarone  200 mg Oral Daily  . apixaban  5 mg Oral BID  . atorvastatin  40 mg Oral q1800  . carvedilol  25 mg Oral BID WC  . cholecalciferol  1,000 Units Oral Daily  .  fluticasone furoate-vilanterol  1 puff Inhalation Daily  . magnesium oxide  200 mg Oral BID  . pantoprazole  40 mg Oral Daily  . sodium chloride flush  3 mL Intravenous Once  . sodium chloride flush  3 mL Intravenous Q12H   Continuous Infusions: . lactated ringers     PRN Meds:.acetaminophen **OR** acetaminophen, benzonatate, menthol-cetylpyridinium, oxyCODONE, polyethylene glycol, zolpidem  CBC Recent Labs  Lab 02/27/19 1537 02/28/19 0134  WBC 12.3* 10.7*  HGB 11.6* 12.4*  HCT 35.0* 37.7*  MCV 85.2 85.5  PLT 201 774   Basic Metabolic Panel Recent Labs  Lab 02/27/19 1537 02/28/19 0134  NA 131* 132*  K 4.7 4.4  CL 97* 98  CO2 20* 21*  GLUCOSE 114* 90  BUN 135* 131*  CREATININE 5.92* 5.46*  CALCIUM 10.1 10.3    Physical Exam  Blood pressure (!) 131/33, pulse (!) 55, temperature 97.7 F (36.5 C), temperature source Oral, resp. rate 16, height 5\' 6"  (1.676 m), weight 111.1 kg, SpO2 100 %. GEN: No acute distress, conversant, pleasant ENT: NCAT, MMM EYES: EOMI CV: Regular, normal rate, normal S1 and S2 without rub or murmur PULM: Clear bilaterally, normal work of breathing, no rales ABD: Soft, nontender, no suprapubic fullness, obese SKIN: No rashes, bruising, petechia EXT: No peripheral edema  Assessment 39M presenting with acute on chronic renal failure in the setting of chronic systolic heart failure on diuretics and Entresto.  1. AoCKD3, BL SCr 1.6-2.0; suspect driven by hypovolemia related to diarrhea/anorexia/diuretics + use of Entresto; no evidence of GN based upon UA; needs ultrasound to exclude any obstruction; perhaps has progressed to ATN 2. Weakness/diarrhea: Per primary 3. Chronic systolic heart failure from ischemic cardiomyopathy with AICD on diuretics and Entresto 4. DM2 5. Paroxysmal A. fib on apixaban  Plan 1. Continue to hold furosemide, metolazone, Entresto 2. Start LR at 75 mL/h; carefully monitor fluid status 3. Obtain renal  ultrasound 4. Repeat BMP this afternoon 5. Daily weights, Daily Renal Panel, Strict I/Os, Avoid nephrotoxins (NSAIDs, judicious IV Contrast)    Pearson Grippe MD 02/28/2019, 6:59 AM

## 2019-02-28 NOTE — Progress Notes (Signed)
RT has checked with pt x2 for CPAP and pt not ready to go on. RT informed pt to call when ready to go on.

## 2019-03-01 LAB — BASIC METABOLIC PANEL
Anion gap: 12 (ref 5–15)
BUN: 103 mg/dL — ABNORMAL HIGH (ref 8–23)
CO2: 24 mmol/L (ref 22–32)
Calcium: 10.4 mg/dL — ABNORMAL HIGH (ref 8.9–10.3)
Chloride: 99 mmol/L (ref 98–111)
Creatinine, Ser: 3.54 mg/dL — ABNORMAL HIGH (ref 0.61–1.24)
GFR calc Af Amer: 19 mL/min — ABNORMAL LOW (ref >60)
GFR calc non Af Amer: 17 mL/min — ABNORMAL LOW (ref >60)
Glucose, Bld: 105 mg/dL — ABNORMAL HIGH (ref 70–99)
Potassium: 5.2 mmol/L — ABNORMAL HIGH (ref 3.5–5.1)
Sodium: 135 mmol/L (ref 135–145)

## 2019-03-01 NOTE — Progress Notes (Signed)
Subjective:  Almost 3 liters of urine- BUN and crt improving nicely - renal ultrasound yest unremark- U/A is bland.  Ate breakfast   Objective Vital signs in last 24 hours: Vitals:   03/01/19 0500 03/01/19 0514 03/01/19 0841 03/01/19 0855  BP:  (!) 133/45  (!) 151/40  Pulse:  (!) 56  62  Resp:  18  18  Temp:  98.3 F (36.8 C)  98.1 F (36.7 C)  TempSrc:  Oral  Oral  SpO2:  100% 100% 100%  Weight: 111.4 kg     Height:       Weight change: 0.269 kg  Intake/Output Summary (Last 24 hours) at 03/01/2019 1114 Last data filed at 03/01/2019 0851 Gross per 24 hour  Intake 3518 ml  Output 3075 ml  Net 443 ml    Assessment/ Plan: Pt is a 68 y.o. yo male with ischemic cardiomyopathy- icd and EF 35%, DM as well as baseline CKD (1.6-2) who was admitted on 02/27/2019 with AKI in the setting of poor PO intake, cont high dose diuretics and Entresto  Assessment/Plan: 1. Renal- A on CKD in the setting of poor PO intake, cont high dose diuretics and entresto- work up indicates nothing other than hemodynamic issue.   Those meds on hold- renal function improving nicely.    2. HTN/volume-  Will stop IVF as tank is full - cont to hold diuretics and ARB for now 3. Anemia- not an issue 4.  Hyperk-  Hopefully will correct as renal function improves    Louis Meckel    Labs: Basic Metabolic Panel: Recent Labs  Lab 02/28/19 0134 02/28/19 1527 03/01/19 0316  NA 132* 130* 135  K 4.4 4.8 5.2*  CL 98 98 99  CO2 21* 20* 24  GLUCOSE 90 148* 105*  BUN 131* 119* 103*  CREATININE 5.46* 4.71* 3.54*  CALCIUM 10.3 9.8 10.4*   Liver Function Tests: No results for input(s): AST, ALT, ALKPHOS, BILITOT, PROT, ALBUMIN in the last 168 hours. No results for input(s): LIPASE, AMYLASE in the last 168 hours. No results for input(s): AMMONIA in the last 168 hours. CBC: Recent Labs  Lab 02/27/19 1537 02/28/19 0134  WBC 12.3* 10.7*  HGB 11.6* 12.4*  HCT 35.0* 37.7*  MCV 85.2 85.5  PLT 201 171    Cardiac Enzymes: No results for input(s): CKTOTAL, CKMB, CKMBINDEX, TROPONINI in the last 168 hours. CBG: Recent Labs  Lab 02/27/19 1709 02/27/19 2037  GLUCAP 126* 100*    Iron Studies: No results for input(s): IRON, TIBC, TRANSFERRIN, FERRITIN in the last 72 hours. Studies/Results: US Renal  Result Date: 02/28/2019 CLINICAL DATA:  Acute kidney injury. EXAM: RENAL / URINARY TRACT ULTRASOUND COMPLETE COMPARISON:  MRI 09/19/2018. FINDINGS: Right Kidney: Renal measurements: 10.7 x 5.6 x 5.2 cm = volume: 162.8 mL . Echogenicity within normal limits. No mass or hydronephrosis visualized. Left Kidney: Renal measurements: 9.8 x 6.2 x 6.0 cm = volume: 191.5 mL. Left kidney is difficult to visualize. Echogenicity within normal limits. No mass or hydronephrosis visualized. Bladder: Appears normal for degree of bladder distention. IMPRESSION: No acute abnormality identified.  No hydronephrosis. Electronically Signed   By: Marcello Moores  Register   On: 02/28/2019 13:28   Medications: Infusions: . lactated ringers 75 mL/hr at 03/01/19 0700    Scheduled Medications: . amiodarone  200 mg Oral Daily  . apixaban  5 mg Oral BID  . atorvastatin  40 mg Oral q1800  . carvedilol  25 mg Oral BID WC  .  cholecalciferol  1,000 Units Oral Daily  . fluticasone furoate-vilanterol  1 puff Inhalation Daily  . magnesium oxide  200 mg Oral BID  . pantoprazole  40 mg Oral Daily  . sodium chloride flush  3 mL Intravenous Q12H    have reviewed scheduled and prn medications.  Physical Exam: General: sleepy but arousable- NAD Heart: RRR Lungs: mostly clear Abdomen: soft, non tender Extremities: ne edema    03/01/2019,11:14 AM  LOS: 2 days

## 2019-03-01 NOTE — Progress Notes (Signed)
PROGRESS NOTE   Dean Bones Sr.  DPO:[id number]    DOB: 1950/10/24    DOA: 02/27/2019  PCP: [name], MD   I have briefly reviewed patients previous medical records in [facility].  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  Lab work a week PTA showed worsening creatinine and patient advised to come to ED.  In ED creatinine 5.92 up from 1.6 on 09/19/2018.  Admitted for acute on chronic renal failure.  Nephrology consulting.   Assessment & Plan:   Principal Problem:   Acute kidney failure (HCC) Active Problems:   DM2 (diabetes mellitus, type 2) ([place])   Morbid obesity ([place])   Essential hypertension, benign   OSA (obstructive sleep apnea)   Stage 3 chronic kidney disease (HCC)   Chronic combined systolic and diastolic CHF (congestive heart failure) (HCC)   Acute renal failure on stage III chronic kidney disease.  Baseline creatinine 1.6-2.  Presented with creatinine of 5.92 and BUN of 135.  Suspect due to hypovolemia from GI losses, ongoing diuretic use and Entresto.  UA without evidence of GN.  Renal ultrasound without hydronephrosis.  SARS coronavirus 2: Negative.  No history of NSAID use or contrast exposure.  Held furosemide, metolazone and Entresto.  IV fluids/LR at 75 mils per hour, discontinued after clinically euvolemic status.  Creatinine has improved from 5.92-4.71.  Creatinine has steadily improved, down to 3.54 on 6/6  Follow daily BMP.  Mild hyperkalemia  Potassium 5.2.  He did not have hyperkalemia until today.  No hemolysis reported.  Possibly from acute kidney injury.  Hopefully will self-correct with improvement in acute kidney injury.   Follow BMP in a.m.  Dehydration with hyponatremia  Secondary to GI losses and diuretics.  Temporarily hold diuretics.  Resolved after IV fluids.  Tolerating oral diet well.  DM type II with renal complications  Appears to be diet controlled at home and not on oral hypoglycemics.  A1c 6.1.  Monitor CBGs and SSI if needed.  Essential hypertension  Continue carvedilol 25 mg twice daily.  Holding [place].  Hyperlipidemia  Continue atorvastatin  OSA  Noncompliant with CPAP.  Chronic systolic CHF/ICM/chronic LBBB/AICD/history of VT/VF arrest/CAD  Clinically euvolemic today.  Thereby diuretics temporarily on hold.  Continue carvedilol.  Follows with [facility] as outpatient.  PAF  Mostly sinus bradycardia in the 50s-occasional sinus rhythm.  Continue amiodarone, carvedilol and apixaban.  Asthma  Stable.  Morbid obesity/Body mass index is 39.64 kg/m.    DVT prophylaxis: Subcutaneous heparin Code Status: Full Family Communication: None at bedside Disposition: DC home pending clinical improvement, possibly 6/7.   Consultants:  Nephrology  Procedures:  None  Antimicrobials:  None   Subjective: Denies complaints.  Diarrhea resolved.  Had formed normal BM yesterday.  No dyspnea or chest pain.  Urinating well.  ROS: As above, otherwise negative.  Objective:  Vitals:   03/01/19 0500 03/01/19 0514 03/01/19 0841 03/01/19 0855  BP:  (!) 133/45  (!) 151/40  Pulse:  (!) 56  62  Resp:  18  18  Temp:  98.3 F (36.8 C)  98.1 F (36.7 C)  TempSrc:  Oral  Oral  SpO2:  100% 100% 100%  Weight: 111.4  kg     Height:        Examination:  General exam: Pleasant middle-age male, moderately built and obese lying comfortably supine in bed.  Oral mucosa moist Respiratory system: Clear to auscultation.  No increased work of breathing Cardiovascular system: S1 & S2 heard, RRR. No JVD, murmurs, rubs, gallops or clicks. No pedal edema.  Telemetry personally  reviewed: Sinus bradycardia in the 50s and occasional sinus rhythm. Gastrointestinal system: Abdomen is nondistended, soft and nontender. No organomegaly or masses felt. Normal bowel sounds heard.  Stable. Central nervous system: Alert and oriented. No focal neurological deficits. Extremities: Symmetric 5 x 5 power. Skin: No rashes, lesions or ulcers Psychiatry: Judgement and insight appear normal. Mood & affect appropriate.     Data Reviewed: I have personally reviewed following labs and imaging studies  CBC: Recent Labs  Lab 02/27/19 1537 02/28/19 0134  WBC 12.3* 10.7*  HGB 11.6* 12.4*  HCT 35.0* 37.7*  MCV 85.2 85.5  PLT 201 803   Basic Metabolic Panel: Recent Labs  Lab 02/27/19 1537 02/28/19 0134 02/28/19 1527 03/01/19 0316  NA 131* 132* 130* 135  K 4.7 4.4 4.8 5.2*  CL 97* 98 98 99  CO2 20* 21* 20* 24  GLUCOSE 114* 90 148* 105*  BUN 135* 131* 119* 103*  CREATININE 5.92* 5.46* 4.71* 3.54*  CALCIUM 10.1 10.3 9.8 10.4*   HbA1C: Recent Labs    02/27/19 1904  HGBA1C 6.1*   CBG: Recent Labs  Lab 02/27/19 1709 02/27/19 2037  GLUCAP 126* 100*    Recent Results (from the past 240 hour(s))  SARS Coronavirus 2 (CEPHEID - Performed in [place] hospital lab), Hosp Order     Status: None   Collection Time: 02/27/19  7:14 PM  Result Value Ref Range Status   SARS Coronavirus 2 NEGATIVE NEGATIVE Final    Comment: (NOTE) If result is NEGATIVE SARS-CoV-2 target nucleic acids are NOT DETECTED. The SARS-CoV-2 RNA is generally detectable in upper and lower  respiratory specimens during the acute phase of infection. The lowest  concentration of SARS-CoV-2 viral copies this assay can detect is 250  copies / mL. A negative result does not preclude SARS-CoV-2 infection  and should not be used as the sole basis for treatment or other  patient management decisions.  A negative result may occur with  improper specimen collection / handling, submission of specimen other   than nasopharyngeal swab, presence of viral mutation(s) within the  areas targeted by this assay, and inadequate number of viral copies  (<250 copies / mL). A negative result must be combined with clinical  observations, patient history, and epidemiological information. If result is POSITIVE SARS-CoV-2 target nucleic acids are DETECTED. The SARS-CoV-2 RNA is generally detectable in upper and lower  respiratory specimens dur ing the acute phase of infection.  Positive  results are indicative of active infection with SARS-CoV-2.  Clinical  correlation with patient history and other diagnostic information is  necessary to determine patient infection status.  Positive results do  not rule out bacterial infection or co-infection with other viruses. If result is PRESUMPTIVE POSTIVE SARS-CoV-2 nucleic acids MAY BE PRESENT.   A presumptive positive result was obtained on the submitted specimen  and confirmed on repeat testing.  While 2019 novel coronavirus  (SARS-CoV-2) nucleic acids may be present in the submitted sample  additional confirmatory testing may be necessary for epidemiological  and / or clinical management purposes  to differentiate between  SARS-CoV-2 and other Sarbecovirus  currently known to infect humans.  If clinically indicated additional testing with an alternate test  methodology [phone]) is advised. The SARS-CoV-2 RNA is generally  detectable in upper and lower respiratory sp ecimens during the acute  phase of infection. The expected result is Negative. Fact Sheet for Patients:  StrictlyIdeas.no Fact Sheet for Healthcare Providers: BankingDealers.co.za This test is not yet approved or cleared by the [place] FDA and has been authorized for detection and/or diagnosis of SARS-CoV-2 by FDA under an Emergency Use Authorization (EUA).  This EUA will remain in effect (meaning this test can be used) for the duration of the  COVID-19 declaration under Section 564(b)(1) of the Act, 21 U.S.C. section 360bbb-3(b)(1), unless the authorization is terminated or revoked sooner. Performed at [facility], [place] [address], [place], [place] [address]          Radiology Studies: US Renal  Result Date: 02/28/2019 CLINICAL DATA:  Acute kidney injury. EXAM: RENAL / URINARY TRACT ULTRASOUND COMPLETE COMPARISON:  MRI 09/19/2018. FINDINGS: Right Kidney: Renal measurements: 10.7 x 5.6 x 5.2 cm = volume: 162.8 mL . Echogenicity within normal limits. No mass or hydronephrosis visualized. Left Kidney: Renal measurements: 9.8 x 6.2 x 6.0 cm = volume: 191.5 mL. Left kidney is difficult to visualize. Echogenicity within normal limits. No mass or hydronephrosis visualized. Bladder: Appears normal for degree of bladder distention. IMPRESSION: No acute abnormality identified.  No hydronephrosis. Electronically Signed   By: Marcello Moores  Register   On: 02/28/2019 13:28        Scheduled Meds: . amiodarone  200 mg Oral Daily  . apixaban  5 mg Oral BID  . atorvastatin  40 mg Oral q1800  . carvedilol  25 mg Oral BID WC  . cholecalciferol  1,000 Units Oral Daily  . fluticasone furoate-vilanterol  1 puff Inhalation Daily  . magnesium oxide  200 mg Oral BID  . pantoprazole  40 mg Oral Daily  . sodium chloride flush  3 mL Intravenous Q12H   Continuous Infusions: . lactated ringers 75 mL/hr at 03/01/19 0700     LOS: 2 days     Vernell Leep, MD, FACP, [facility]. [facility]  To contact the attending provider between 7A-7P or the covering provider during after hours 7P-7A, please log into the web site [email or web address] and access using universal [place] password for that web site. If you do not have the password, please call the hospital operator.  03/01/2019, 2:29 PM

## 2019-03-02 LAB — RENAL FUNCTION PANEL
Albumin: 3.5 g/dL (ref 3.5–5.0)
Anion gap: 12 (ref 5–15)
BUN: 79 mg/dL — ABNORMAL HIGH (ref 8–23)
CO2: 24 mmol/L (ref 22–32)
Calcium: 10.2 mg/dL (ref 8.9–10.3)
Chloride: 100 mmol/L (ref 98–111)
Creatinine, Ser: 2.64 mg/dL — ABNORMAL HIGH (ref 0.61–1.24)
GFR calc Af Amer: 27 mL/min — ABNORMAL LOW (ref >60)
GFR calc non Af Amer: 24 mL/min — ABNORMAL LOW (ref >60)
Glucose, Bld: 115 mg/dL — ABNORMAL HIGH (ref 70–99)
Phosphorus: 2.4 mg/dL — ABNORMAL LOW (ref 2.5–4.6)
Potassium: 4.8 mmol/L (ref 3.5–5.1)
Sodium: 136 mmol/L (ref 135–145)

## 2019-03-02 MED ORDER — FUROSEMIDE 40 MG PO TABS: 60.0000 mg | ORAL_TABLET | Freq: Two times a day (BID) | ORAL | Status: DC

## 2019-03-02 NOTE — Progress Notes (Signed)
DISCHARGE NOTE [facility] to be discharged Home per MD order. Patient verbalized understanding.  Skin clean, dry and intact without evidence of skin break down, no evidence of skin tears noted. IV catheter discontinued intact. Site without signs and symptoms of complications. Dressing and pressure applied. Pt denies pain at the site currently. No complaints noted.  Patient free of lines, drains, and wounds.   Discharge packet assembled. An After Visit Summary (AVS) was printed and given to the patient at bedside.  Patient escorted via wheelchair and discharged to designated family member via private vehicle. All questions and concerns addressed.   Dolores Hoose, RN

## 2019-03-02 NOTE — Discharge Summary (Signed)
Physician Discharge Summary  Dean Bones Sr. XVQ:[id number] DOB: 08/03/1951  PCP: [name], MD  Admit date: 02/27/2019 Discharge date: 03/02/2019  Recommendations for Outpatient Follow-up:  1. Dr. [name], PCP in 5 days with repeat labs (CBC & BMP). 2. Dr. Allegra Lai, EP Cardiology in 1 week.  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  Lab work a week PTA showed worsening creatinine and patient advised to come to ED.  In ED creatinine 5.92 up from 1.6 on 09/19/2018.  Admitted for acute on chronic renal failure.  Nephrology consulted.   Assessment & Plan:  Acute renal failure on stage III chronic kidney disease.  Baseline creatinine 1.6-2.  Presented with creatinine of 5.92 and BUN of 135.  Suspect due to hypovolemia from GI losses, ongoing diuretic use and Entresto.  UA without evidence of GN.  Renal ultrasound without hydronephrosis.  SARS coronavirus 2: Negative.  No history of NSAID use or contrast exposure.  Held furosemide, metolazone and Entresto.  IV fluids/LR at 75 mils per hour, discontinued after clinically euvolemic status.  Creatinine steadily improved and down to 2.6 on  day of discharge and approaching baseline.  Nephrology follow-up appreciated, recommend resuming Lasix from tomorrow but discontinue Zaroxolyn and Entresto until close follow-up with PCP or Cardiology.  This was discussed in detail with patient and he verbalized understanding.  Nephrology does not plan follow-up and Cardiology/PCP can decide during outpatient visit.  Mild hyperkalemia  Potassium 5.2 on 6/6.  No hemolysis reported.  Possibly from acute kidney injury.  Resolved spontaneously.  Dehydration with hyponatremia  Secondary to GI losses and diuretics.  Temporarily held diuretics.  Resolved after IV fluids.  Tolerating oral diet well.  DM type II with renal complications  Appears to be diet controlled at home and not on oral hypoglycemics.  A1c 6.1.  Essential hypertension  Continue carvedilol 25 mg twice daily.    Discontinued Entresto until outpatient follow-up with Cardiology.  Hyperlipidemia  Continue atorvastatin  OSA  Noncompliant with CPAP.  Chronic systolic CHF/ICM/chronic LBBB/AICD/history of VT/VF arrest/CAD  Continue carvedilol.  Lasix which was temporarily held in the hospital to be resumed post discharge.  Discontinued metolazone and Entresto until close outpatient follow-up with Cardiology/PCP with repeat BMP.  Follows with EP Cardiology as outpatient.  PAF  SB in the 50s-SR in the 60s.  Continue amiodarone, carvedilol and apixaban.  Patient was not taking aspirin 81 mg PTA and removed from his medication list.  Asthma  Stable.  Patient just got a one-time sample of the [facility] which she has not used in several months and was removed from his medication list.  Morbid obesity/Body mass index is 39.64 kg/m.   Consultants:  Nephrology  Procedures:  None   Discharge  Instructions  Discharge Instructions    (HEART FAILURE PATIENTS) Call MD:  Anytime you have any of the following symptoms: 1) 3 pound weight gain  in 24 hours or 5 pounds in 1 week 2) shortness of breath, with or without a dry hacking cough 3) swelling in the hands, feet or stomach 4) if you have to sleep on extra pillows at night in order to breathe.   Complete by:  As directed    Call MD for:   Complete by:  As directed    Recurrent diarrhea.   Call MD for:  difficulty breathing, headache or visual disturbances   Complete by:  As directed    Call MD for:  extreme fatigue   Complete by:  As directed    Call MD for:  persistant dizziness or light-headedness   Complete by:  As directed    Call MD for:  persistant nausea and vomiting   Complete by:  As directed    Call MD for:  severe uncontrolled pain   Complete by:  As directed    Call MD for:  temperature >100.4   Complete by:  As directed    Diet - low sodium heart healthy   Complete by:  As directed    Increase activity slowly   Complete by:  As directed        Medication List    STOP taking these medications   aspirin EC 81 MG tablet   fluticasone furoate-vilanterol 200-25 MCG/INH Aepb Commonly known as:  Breo Ellipta   metolazone 2.5 MG tablet Commonly known as:  ZAROXOLYN   sacubitril-valsartan 49-51 MG Commonly known as:  Entresto     TAKE these medications   allopurinol 300 MG tablet Commonly known as:  ZYLOPRIM Take 300 mg by mouth daily.   amiodarone 200 MG tablet Commonly known as:  PACERONE Take 1 tablet (200 mg total) by mouth daily.   apixaban 5 MG Tabs tablet Commonly known as:  ELIQUIS Take 1 tablet (5 mg total) by mouth 2 (two) times daily.   atorvastatin 40 MG tablet Commonly known as:  LIPITOR Take 1 tablet (40 mg total) by mouth daily at 6 PM.   carvedilol 25 MG tablet Commonly known as:  COREG Take 1 tablet (25 mg total) by mouth 2 (two) times daily with a meal.   [employer] Vitamin D3 25 MCG (1000 UT) capsule Generic drug:  Cholecalciferol Take 1,000 Units by mouth daily.   furosemide 40 MG tablet Commonly known as:  LASIX Take  1.5 tablets (60 mg total) by mouth 2 (two) times daily. Start taking on:  March 03, 2019   magnesium oxide 400 MG tablet Commonly known as:  MAG-OX Take 200 mg by mouth 2 (two) times daily.   pantoprazole 40 MG tablet Commonly known as:  PROTONIX Take 1 tablet (40 mg total) by mouth daily.      Follow-up Information    [name], MD. Schedule an appointment as soon as possible for a visit in 5 day(s).   Specialty:  Internal Medicine Why:  To be seen with repeat labs (CBC & BMP). Contact information: [address] [address] [phone]        [name], MD. Schedule an appointment as soon as possible for a visit in 1 week(s).   Specialty:  Cardiology Contact information: [address] [place] [address] [phone]          Allergies  Allergen Reactions  .  Contrast Media [Iodinated Diagnostic Agents] Shortness Of Breath and Nausea And Vomiting  . Lisinopril Cough      Procedures/Studies: US Renal  Result Date: 02/28/2019 CLINICAL DATA:  Acute kidney injury. EXAM: RENAL / URINARY TRACT ULTRASOUND COMPLETE COMPARISON:  MRI 09/19/2018. FINDINGS: Right Kidney: Renal measurements: 10.7 x 5.6 x 5.2 cm = volume: 162.8 mL . Echogenicity within normal limits. No mass or hydronephrosis visualized. Left Kidney: Renal measurements: 9.8 x 6.2 x 6.0 cm = volume: 191.5 mL. Left kidney is difficult to visualize. Echogenicity within normal limits. No mass or hydronephrosis visualized. Bladder: Appears normal for degree of bladder distention. IMPRESSION: No acute abnormality identified.  No hydronephrosis. Electronically Signed   By: Marcello Moores  Register   On: 02/28/2019 13:28      Subjective: Patient denies complaints.  No dyspnea, chest pain, palpitations, dizziness or lightheadedness.  Diarrhea resolved.  Urinating well.  Discharge Exam:  Vitals:   03/02/19 0500 03/02/19 0510 03/02/19 0801 03/02/19 0905  BP:  (!) 154/65  (!) 140/40   Pulse:  64  63  Resp:  19  18  Temp:  98.4 F (36.9 C)  97.9 F (36.6 C)  TempSrc:  Oral  Oral  SpO2:  100% 98% 100%  Weight: 111.8 kg     Height:        General exam: Pleasant middle-age male, moderately built and obese lying comfortably supine in bed.  Oral mucosa moist Respiratory system: Clear to auscultation.  No increased work of breathing Cardiovascular system: S1 & S2 heard, RRR. No JVD, murmurs, rubs, gallops or clicks. No pedal edema.  Telemetry personally reviewed: Sinus bradycardia in the 50s-SR in the 60s Gastrointestinal system: Abdomen is nondistended, soft and nontender. No organomegaly or masses felt. Normal bowel sounds heard.   Central nervous system: Alert and oriented. No focal neurological deficits. Extremities: Symmetric 5 x 5 power. Skin: No rashes, lesions or ulcers Psychiatry: Judgement and insight appear normal. Mood & affect appropriate.     The results of significant diagnostics from this hospitalization (including imaging, microbiology, ancillary and laboratory) are listed below for reference.     Microbiology: Recent Results (from the past 240 hour(s))  SARS Coronavirus 2 (CEPHEID - Performed in [place] hospital lab), Hosp Order     Status: None   Collection Time: 02/27/19  7:14 PM  Result Value Ref Range Status   SARS Coronavirus 2 NEGATIVE NEGATIVE Final    Comment: (NOTE) If result is NEGATIVE SARS-CoV-2 target nucleic acids are NOT DETECTED. The SARS-CoV-2 RNA is generally detectable in upper and lower  respiratory specimens during the acute phase of infection. The lowest  concentration of SARS-CoV-2 viral copies this assay can detect is 250  copies / mL. A negative result does not preclude SARS-CoV-2 infection  and should not be used as the sole basis for treatment or other  patient management decisions.  A negative result may occur with  improper specimen collection / handling, submission of specimen other  than nasopharyngeal swab,  presence of viral mutation(s) within the  areas targeted by this assay, and inadequate number of viral copies  (<250 copies / mL). A negative result must be combined with clinical  observations, patient history, and epidemiological information. If result is POSITIVE SARS-CoV-2 target nucleic acids are DETECTED. The SARS-CoV-2 RNA is generally detectable in upper and lower  respiratory specimens dur ing the acute phase of infection.  Positive  results are indicative of active infection with SARS-CoV-2.  Clinical  correlation with patient  history and other diagnostic information is  necessary to determine patient infection status.  Positive results do  not rule out bacterial infection or co-infection with other viruses. If result is PRESUMPTIVE POSTIVE SARS-CoV-2 nucleic acids MAY BE PRESENT.   A presumptive positive result was obtained on the submitted specimen  and confirmed on repeat testing.  While 2019 novel coronavirus  (SARS-CoV-2) nucleic acids may be present in the submitted sample  additional confirmatory testing may be necessary for epidemiological  and / or clinical management purposes  to differentiate between  SARS-CoV-2 and other Sarbecovirus currently known to infect humans.  If clinically indicated additional testing with an alternate test  methodology [phone]) is advised. The SARS-CoV-2 RNA is generally  detectable in upper and lower respiratory sp ecimens during the acute  phase of infection. The expected result is Negative. Fact Sheet for Patients:  StrictlyIdeas.no Fact Sheet for Healthcare Providers: BankingDealers.co.za This test is not yet approved or cleared by the [place] FDA and has been authorized for detection and/or diagnosis of SARS-CoV-2 by FDA under an Emergency Use Authorization (EUA).  This EUA will remain in effect (meaning this test can be used) for the duration of the COVID-19 declaration under  Section 564(b)(1) of the Act, 21 U.S.C. section 360bbb-3(b)(1), unless the authorization is terminated or revoked sooner. Performed at [facility], [place] [address], [place], [place] [address]      Labs: CBC: Recent Labs  Lab 02/27/19 1537 02/28/19 0134  WBC 12.3* 10.7*  HGB 11.6* 12.4*  HCT 35.0* 37.7*  MCV 85.2 85.5  PLT 201 130   Basic Metabolic Panel: Recent Labs  Lab 02/27/19 1537 02/28/19 0134 02/28/19 1527 03/01/19 0316 03/02/19 0443  NA 131* 132* 130* 135 136  K 4.7 4.4 4.8 5.2* 4.8  CL 97* 98 98 99 100  CO2 20* 21* 20* 24 24  GLUCOSE 114* 90 148* 105* 115*  BUN 135* 131* 119* 103* 79*  CREATININE 5.92* 5.46* 4.71* 3.54* 2.64*  CALCIUM 10.1 10.3 9.8 10.4* 10.2  PHOS  --   --   --   --  2.4*   Liver Function Tests: Recent Labs  Lab 03/02/19 0443  ALBUMIN 3.5   CBG: Recent Labs  Lab 02/27/19 1709 02/27/19 2037  GLUCAP 126* 100*   Hgb A1c Recent Labs    02/27/19 1904  HGBA1C 6.1*   Thyroid function studies Recent Labs    02/28/19 0134  TSH 2.943   Urinalysis    Component Value Date/Time   COLORURINE YELLOW 02/27/2019 1700   APPEARANCEUR CLEAR 02/27/2019 1700   LABSPEC 1.010 02/27/2019 1700   PHURINE 5.0 02/27/2019 1700   GLUCOSEU NEGATIVE 02/27/2019 1700   HGBUR NEGATIVE 02/27/2019 1700   BILIRUBINUR NEGATIVE 02/27/2019 1700   KETONESUR NEGATIVE 02/27/2019 1700   PROTEINUR NEGATIVE 02/27/2019 1700   NITRITE NEGATIVE 02/27/2019 1700   LEUKOCYTESUR TRACE (A) 02/27/2019 1700      Time coordinating discharge: 25 minutes  SIGNED:  Vernell Leep, MD, FACP, [facility]. [facility]  To contact the attending provider between 7A-7P or the covering provider during after hours 7P-7A, please log into the web site [email or web address] and access using universal [place] password for that web site. If you do not have the password, please call the hospital operator.

## 2019-03-02 NOTE — Progress Notes (Signed)
Subjective:  1800 of urine- BUN and crt improving nicely still -     Objective Vital signs in last 24 hours: Vitals:   03/02/19 0500 03/02/19 0510 03/02/19 0801 03/02/19 0905  BP:  (!) 154/65  (!) 140/40  Pulse:  64  63  Resp:  19  18  Temp:  98.4 F (36.9 C)  97.9 F (36.6 C)  TempSrc:  Oral  Oral  SpO2:  100% 98% 100%  Weight: 111.8 kg     Height:       Weight change: 0.366 kg  Intake/Output Summary (Last 24 hours) at 03/02/2019 1030 Last data filed at 03/02/2019 9811 Gross per 24 hour  Intake 1767.1 ml  Output 1900 ml  Net -132.9 ml    Assessment/ Plan: Pt is a 68 y.o. yo male with ischemic cardiomyopathy- icd and EF 35%, DM as well as baseline CKD (1.6-2) who was admitted on 02/27/2019 with AKI in the setting of poor PO intake, cont high dose diuretics and Entresto  Assessment/Plan: 1. Renal- A on CKD in the setting of poor PO intake, cont high dose diuretics and entresto- work up indicates nothing other than hemodynamic issue.   Those meds on hold- renal function improving nicely.  Close to baseline.  I think would be OK for discharge - would choose lasix 80 BID to start Monday, no zaroxolyn and no entresto with close follow up with cards/PCP.  Will not make specific plans for nephrology follow up  - let cards decide if they feel he will need  2. HTN/volume-   tank is full - cont to hold diuretics and ARB for now-  See plan above  3. Anemia- not an issue 4.  Hyperk-  Hopefully will correct as renal function improves- better     Louis Meckel    Labs: Basic Metabolic Panel: Recent Labs  Lab 02/28/19 1527 03/01/19 0316 03/02/19 0443  NA 130* 135 136  K 4.8 5.2* 4.8  CL 98 99 100  CO2 20* 24 24  GLUCOSE 148* 105* 115*  BUN 119* 103* 79*  CREATININE 4.71* 3.54* 2.64*  CALCIUM 9.8 10.4* 10.2  PHOS  --   --  2.4*   Liver Function Tests: Recent Labs  Lab 03/02/19 0443  ALBUMIN 3.5   No results for input(s): LIPASE, AMYLASE in the last 168 hours. No  results for input(s): AMMONIA in the last 168 hours. CBC: Recent Labs  Lab 02/27/19 1537 02/28/19 0134  WBC 12.3* 10.7*  HGB 11.6* 12.4*  HCT 35.0* 37.7*  MCV 85.2 85.5  PLT 201 171   Cardiac Enzymes: No results for input(s): CKTOTAL, CKMB, CKMBINDEX, TROPONINI in the last 168 hours. CBG: Recent Labs  Lab 02/27/19 1709 02/27/19 2037  GLUCAP 126* 100*    Iron Studies: No results for input(s): IRON, TIBC, TRANSFERRIN, FERRITIN in the last 72 hours. Studies/Results: US Renal  Result Date: 02/28/2019 CLINICAL DATA:  Acute kidney injury. EXAM: RENAL / URINARY TRACT ULTRASOUND COMPLETE COMPARISON:  MRI 09/19/2018. FINDINGS: Right Kidney: Renal measurements: 10.7 x 5.6 x 5.2 cm = volume: 162.8 mL . Echogenicity within normal limits. No mass or hydronephrosis visualized. Left Kidney: Renal measurements: 9.8 x 6.2 x 6.0 cm = volume: 191.5 mL. Left kidney is difficult to visualize. Echogenicity within normal limits. No mass or hydronephrosis visualized. Bladder: Appears normal for degree of bladder distention. IMPRESSION: No acute abnormality identified.  No hydronephrosis. Electronically Signed   By: Marcello Moores  Register   On: 02/28/2019 13:28  Medications: Infusions:   Scheduled Medications: . amiodarone  200 mg Oral Daily  . apixaban  5 mg Oral BID  . atorvastatin  40 mg Oral q1800  . carvedilol  25 mg Oral BID WC  . cholecalciferol  1,000 Units Oral Daily  . fluticasone furoate-vilanterol  1 puff Inhalation Daily  . magnesium oxide  200 mg Oral BID  . pantoprazole  40 mg Oral Daily  . sodium chloride flush  3 mL Intravenous Q12H    have reviewed scheduled and prn medications.  Physical Exam: General: sleepy but arousable- NAD Heart: RRR Lungs: mostly clear Abdomen: soft, non tender Extremities: ne edema    03/02/2019,10:30 AM  LOS: 3 days

## 2019-03-04 ENCOUNTER — Telehealth: Payer: Self-pay | Admitting: Cardiology

## 2019-03-04 NOTE — Telephone Encounter (Signed)
New message:   Patient has some question has some question concering his medication and his device. Patient just go out the hospital Sunday 03/02/19. Please cal patient.

## 2019-03-05 NOTE — Telephone Encounter (Signed)
Pt informed that we would address re-starting d/c medication at [facility] next week w/ Chanetta Marshall, NP. Pt understands.

## 2019-03-10 ENCOUNTER — Other Ambulatory Visit: Payer: Self-pay

## 2019-03-10 ENCOUNTER — Ambulatory Visit: Payer: Medicare HMO | Admitting: Student

## 2019-03-10 ENCOUNTER — Encounter: Payer: Self-pay | Admitting: Nurse Practitioner

## 2019-03-10 VITALS — BP 140/52 | HR 67 | Ht 66.0 in | Wt 251.6 lb

## 2019-03-10 DIAGNOSIS — Z79899 Other long term (current) drug therapy: Secondary | ICD-10-CM

## 2019-03-10 LAB — CUP PACEART INCLINIC DEVICE CHECK
Battery Remaining Longevity: 122 mo
Battery Voltage: 3.01 V
Brady Statistic RV Percent Paced: 0.26 %
Date Time Interrogation Session: 20200615115720
HighPow Impedance: 75 Ohm
Implantable Lead Implant Date: 20180116
Implantable Lead Location: 753860
Implantable Lead Model: 6935
Implantable Pulse Generator Implant Date: 20180116
Lead Channel Impedance Value: 304 Ohm
Lead Channel Impedance Value: 380 Ohm
Lead Channel Pacing Threshold Amplitude: 0.75 V
Lead Channel Pacing Threshold Pulse Width: 0.4 ms
Lead Channel Sensing Intrinsic Amplitude: 10.5 mV
Lead Channel Sensing Intrinsic Amplitude: 13 mV
Lead Channel Setting Pacing Amplitude: 2.5 V
Lead Channel Setting Pacing Pulse Width: 0.4 ms
Lead Channel Setting Sensing Sensitivity: 0.3 mV

## 2019-03-10 MED ORDER — FUROSEMIDE 40 MG PO TABS: 60.0000 mg | ORAL_TABLET | Freq: Two times a day (BID) | ORAL | 3 refills | Status: DC

## 2019-03-10 NOTE — Patient Instructions (Signed)
Medication Instructions:  none If you need a refill on your cardiac medications before your next appointment, please call your pharmacy.   Lab work:TODAY BMET If you have labs (blood work) drawn today and your tests are completely normal, you will receive your results only by: [name] MyChart Message (if you have MyChart) OR . A paper copy in the mail If you have any lab test that is abnormal or we need to change your treatment, we will call you to review the results.  Testing/Procedures: NONE  Follow-Up: 4 WEEKS with Amber Or Jonni Sanger OR Dr Tutuilla, you and your health needs are our priority.  As part of our continuing mission to provide you with exceptional heart care, we have created designated Provider Care Teams.  These Care Teams include your primary Cardiologist (physician) and Advanced Practice Providers (APPs -  Physician Assistants and Nurse Practitioners) who all work together to provide you with the care you need, when you need it. .   Any Other Special Instructions Will Be Listed Below (If Applicable).

## 2019-03-10 NOTE — Progress Notes (Signed)
Electrophysiology Office Note   Date:  03/10/2019   ID:  Dean Bones Sr., DOB October 08, 1950, MRN [id number]  PCP:  [name], MD Primary Electrophysiologist:  Shirley Friar, PA-C    CC: Post hospital   History of Present Illness: Dean Calabretta Sr. is a 68 y.o. male who presents today for electrophysiology evaluation.   Hx asthma, T2DM, asthma, gout, HTN who was admitted on 08/27/2016 after he collapsed walking back from the bathroom. CPR not performed by family. He was treated with CPR and ALS protocol and he coded again en route to the hospital.  He had episode of NSVT in CT scanner and treated with amiodarone. EKG with inferior T wave inversions and was treated with [employer] protocol and elevated troponin felt to be due to shock applied for VT/VF. Cardiac cath 12/14 revealed 90% ostial stenosis distal R-RCA with left to right collaterals and 100% stenosis mid RCA to distal RCA lesion. ICD implanted 10/10/16.   Admitted 6/4 - 6/7 for AKI after 3 weeks of weakness/fatigue. Given IVF with good recovery. Lasix adjusted, metolazone/entresto held. Feeling much better today. His weight has been stable since discharge. He was taking metolazone daily PTA. No further dizziness or lightheadedness. No orthopnea or SOB with ADLs. No chest pain. No presyncope, syncope, LE edema, bleeding, or neurologic sequela. Atrial fibrillation was noted in his device interrogation. Burden <1%. He is minimally symptomatic and does not feel his arrhythmia.   Past Medical History:  Diagnosis Date  . 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 Arthritis    "maybe in his spine" (09/05/2016)  . Asthma    pt. denies  . Cardiac arrest ([place]) 08/27/2016   REQUIRING CPR, SHOCK & MEDICATIONS  . CHF (congestive heart failure) ([place])   . Coronary artery disease    90% ostRCA stenosis and total occlusion of dRCA with L-->R collaterals 2017  . Dysrhythmia   . Gout   . High cholesterol    . Hypertension   . Ischemic cardiomyopathy   . LBBB (left bundle branch block)   . OSA on CPAP   . Type II diabetes mellitus ([place])    Past Surgical History:  Procedure Laterality Date  . CARDIAC CATHETERIZATION N/A 09/07/2016   Procedure: Left Heart Cath and Coronary Angiography;  Surgeon: Peter M Martinique, MD;  Location: [facility];  Service: Cardiovascular;  Laterality: N/A;  . COLONOSCOPY WITH PROPOFOL N/A 08/30/2018   Procedure: COLONOSCOPY WITH PROPOFOL;  Surgeon: Carol Ada, MD;  Location: [facility];  Service: Endoscopy;  Laterality: N/A;  . EP IMPLANTABLE DEVICE N/A 10/10/2016   Procedure: ICD Implant;  Surgeon: Will Meredith Leeds, MD;  Location: [facility];  Service: Cardiovascular;  Laterality: N/A;  . LUMBAR LAMINECTOMY/DECOMPRESSION MICRODISCECTOMY N/A 01/18/2018   Procedure: L2-3, L3-4, L-4-5 CENTRAL DECOMPRESSION;  Surgeon: [name], MD;  Location: [facility];  Service: Orthopedics;  Laterality: N/A;  . POLYPECTOMY  08/30/2018   Procedure: POLYPECTOMY;  Surgeon: Carol Ada, MD;  Location: [facility];  Service: Endoscopy;;     Current Outpatient Medications  Medication Sig Dispense Refill  . allopurinol (ZYLOPRIM) 300 MG tablet Take 300 mg by mouth daily.    [name] amiodarone (PACERONE) 200 MG tablet Take 1 tablet (200 mg total) by mouth daily. 90 tablet 2  . apixaban (ELIQUIS) 5 MG TABS tablet Take 1 tablet (5 mg total) by mouth 2 (two) times daily. 60 tablet 5  . atorvastatin (LIPITOR) 40 MG tablet Take 1 tablet (40  mg total) by mouth daily at 6 PM. 30 tablet 5  . carvedilol (COREG) 25 MG tablet Take 1 tablet (25 mg total) by mouth 2 (two) times daily with a meal. 60 tablet 7  . CVS VITAMIN D3 1000 units capsule Take 1,000 Units by mouth daily.  5  . furosemide (LASIX) 40 MG tablet Take 1.5 tablets (60 mg total) by mouth 2 (two) times daily. 60 tablet 3  . magnesium oxide (MAG-OX) 400 MG tablet Take 200 mg by mouth 2 (two) times daily.    . pantoprazole  (PROTONIX) 40 MG tablet Take 1 tablet (40 mg total) by mouth daily. 30 tablet 5   No current facility-administered medications for this visit.     Allergies:   Contrast media [iodinated diagnostic agents] and Lisinopril   Social History:  The patient  reports that he has never smoked. He has never used smokeless tobacco. He reports that he does not drink alcohol or use drugs.   Family History:  The patient's family history includes Cancer in his brother; Heart attack in his father; Hypertension in his sister and sister.   Review of systems complete and found to be negative unless listed in HPI.    PHYSICAL EXAM: Vitals:   03/10/19 1125  BP: (!) 140/52  Pulse: 67  SpO2: 100%  Weight: 251 lb 9.6 oz (114.1 kg)  Height: 5\' 6"  (1.676 m)   GEN- The patient is well appearing, alert and oriented x 3 today.   Head- normocephalic, atraumatic Eyes-  Sclera clear, conjunctiva pink Ears- hearing intact Oropharynx- clear Neck- supple,  Lungs- Clear to ausculation bilaterally, normal work of breathing Chest- ICD pocket is without hematoma/ bruit Heart- Regular rate and rhythm, no murmurs, rubs or gallops, PMI not laterally displaced GI- soft, NT, ND, + BS Extremities- no clubbing, cyanosis, or edema Neuro- strength and sensation are intact  Pacemaker interrogation- reviewed in detail today,  See paper chart   EKG:  EKG is not ordered today.  Personal review of the device interrogation done today. Results in [facility]: 05/15/2018: B Natriuretic Peptide 679.6 05/16/2018: Magnesium 1.7 05/18/2018: ALT 23 02/28/2019: Hemoglobin 12.4; Platelets 171; TSH 2.943 03/02/2019: BUN 79; Creatinine, Ser 2.64; Potassium 4.8; Sodium 136    Lipid Panel     Component Value Date/Time   CHOL 159 09/07/2016 0844   TRIG 146 09/07/2016 0844   HDL 25 (L) 09/07/2016 0844   CHOLHDL 6.4 09/07/2016 0844   VLDL 29 09/07/2016 0844   LDLCALC 105 (H) 09/07/2016 0844     Wt Readings from Last 3  Encounters:  03/10/19 251 lb 9.6 oz (114.1 kg)  03/02/19 246 lb 7.6 oz (111.8 kg)  10/03/18 252 lb (114.3 kg)      Other studies Reviewed: Additional studies/ records that were reviewed today include: Labs from recent hospitalization.  ASSESSMENT AND PLAN:  1.  VT/VF arrest: Out of hospital arrest with improvement in mental status.  Medtronic ICD implanted 10/10/2016.  Device functioning appropriately. No changes today. Continue amiodarone.   2. Ischemic cardiomyopathy: Disease in the RCA.  On optimal medical therapy with Coreg and previously Entresto, now on hold for AKI. BMET today. ICD implanted for secondary prevention.  3. CAD: No current exertional chest pain.   4. Hyperlipidemia: Continue statin  5. Hypertension: Controlled on current regimen. Entresto 49/51 held leaving hospital with AKI. BMET today.   6.  Paroxysmal atrial fibrillation: Found on ICD interrogation.  Patient is minimally symptomatic and on  Eliquis.   7. AKI: BMET today. Likely metolazone was major contributor, but not sure that he will tolerated going back on Entresto. He is euvolemic on exam today.   This patients CHA2DS2-VASc Score and unadjusted Ischemic Stroke Rate (% per year) is equal to 4.8 % stroke rate/year from a score of 4  Above score calculated as 1 point each if present [CHF, HTN, DM, Vascular=MI/PAD/Aortic Plaque, Age if 65-74, or Male] Above score calculated as 2 points each if present [Age > 75, or Stroke/TIA/TE]    Current medicines are reviewed at length with the patient today.   The patient does not have concerns regarding his medicines.  The following changes were made today: None  Labs/ tests ordered today include:  Orders Placed This Encounter  Procedures  . Basic Metabolic Panel (BMET)   Disposition:   FU with APP in 4 weeks.   Jacalyn Lefevre, PA-C  03/10/2019 11:46 AM     [facility] [address] [place] [address]  [phone] (office) [phone] (fax)

## 2019-03-11 LAB — BASIC METABOLIC PANEL
BUN/Creatinine Ratio: 26 — ABNORMAL HIGH (ref 10–24)
BUN: 61 mg/dL — ABNORMAL HIGH (ref 8–27)
CO2: 22 mmol/L (ref 20–29)
Calcium: 9.9 mg/dL (ref 8.6–10.2)
Chloride: 97 mmol/L (ref 96–106)
Creatinine, Ser: 2.39 mg/dL — ABNORMAL HIGH (ref 0.76–1.27)
GFR calc Af Amer: 31 mL/min/{1.73_m2} — ABNORMAL LOW (ref >59)
GFR calc non Af Amer: 27 mL/min/{1.73_m2} — ABNORMAL LOW (ref >59)
Glucose: 94 mg/dL (ref 65–99)
Potassium: 4.2 mmol/L (ref 3.5–5.2)
Sodium: 136 mmol/L (ref 134–144)

## 2019-03-17 ENCOUNTER — Telehealth: Payer: Self-pay

## 2019-03-17 ENCOUNTER — Ambulatory Visit (INDEPENDENT_AMBULATORY_CARE_PROVIDER_SITE_OTHER): Payer: Medicare HMO

## 2019-03-17 DIAGNOSIS — I5042 Chronic combined systolic (congestive) and diastolic (congestive) heart failure: Secondary | ICD-10-CM

## 2019-03-17 DIAGNOSIS — Z95810 Presence of automatic (implantable) cardiac defibrillator: Secondary | ICD-10-CM

## 2019-03-17 NOTE — Telephone Encounter (Signed)
Remote ICM transmission received.  Attempted call to patient regarding ICM remote transmission and no answer or voice mail recording.

## 2019-03-17 NOTE — Progress Notes (Signed)
EPIC Encounter for ICM Monitoring  Patient Name: Dean Boley Sr. is a 68 y.o. male Date: 03/17/2019 Primary Care Physican: [name], MD Primary Cardiologist:[place] Electrophysiologist:[place] Last Weight: 251 lbs (at 03/10/2019 office visit)  Attempted call to patient and unable to reach.  Transmission reviewed.  Hospitalized 6/4 - 6/7 with dx of AKI.  Optivol Thoracic impedance abnormal suggesting possible fluid accumulation since 03/11/2019 and ongoing.  Impedance starting to trend up toward baseline.      Prescribed: Furosemide40 mg take 1.5 tablets (60 mg total) by mouth twice a day.  Labs: 03/10/2019 Creatinine 2.39, BUN 61, Potassium 4.2, Sodium 136, GFR 27-31 03/02/2019 Creatinine 2.64, BUN 79, Potassium 4.8, Sodium 136, GFR 24-27  03/01/2019 Creatinine 3.54, BUN 103, Potassium 5.2, Sodium 135, GFR 17-19  02/28/2019 Creatinine 4.71, BUN 119, Potassium 5.2, Sodium 135, GFR 12-14  02/27/2019 Creatinine 5.92, BUN 135, Potassium 4.7, Sodium 131, GFR 9-10  09/19/2018 Creatinine1.60  08/09/2018 Creatinine2.02, BUN30, Potassium4.6, Sodium140, [id number]  07/25/2018 Creatinine1.57, BUN21, Potassium4.0, [id number], GFR45-52  07/10/2018 Creatinine1.12, BUN16, Potassium3.7, Sodium140, [id number]  06/07/2018 Creatinine1.51, BUN28, Potassium4.8, Sodium141, [id number]  05/31/2018 Creatinine2.08, BUN36, Potassium5.1, [id number], [id number] A complete set of results can be found in Results Review.  Recommendations: Unable to reach.    Follow-up plan: ICM clinic phone appointment on6/29/2020 (manual send) to recheck fluid levels.   Copy of ICM check sent to [place], [place].  3 month ICM trend: 03/17/2019     1 year trend:             Rosalene Billings, RN 03/17/2019 10:41 AM

## 2019-03-17 NOTE — Progress Notes (Signed)
Attempted ICM follow up call and no answer or voice mail.

## 2019-03-18 NOTE — Progress Notes (Signed)
Attempted ICM follow up call and no answer or voice mail.

## 2019-03-21 ENCOUNTER — Telehealth: Payer: Self-pay

## 2019-03-21 NOTE — Telephone Encounter (Signed)
Left message for patient to remind of missed remote transmission.  

## 2019-03-25 NOTE — Progress Notes (Signed)
No ICM remote transmission received for 03/21/2019 or 03/24/2019 and next ICM transmission scheduled for 04/02/2019.

## 2019-04-02 ENCOUNTER — Ambulatory Visit (INDEPENDENT_AMBULATORY_CARE_PROVIDER_SITE_OTHER): Payer: Medicare HMO

## 2019-04-02 ENCOUNTER — Ambulatory Visit (INDEPENDENT_AMBULATORY_CARE_PROVIDER_SITE_OTHER): Payer: Medicare Other | Admitting: *Deleted

## 2019-04-02 ENCOUNTER — Telehealth: Payer: Self-pay

## 2019-04-02 DIAGNOSIS — I255 Ischemic cardiomyopathy: Secondary | ICD-10-CM

## 2019-04-02 DIAGNOSIS — Z95810 Presence of automatic (implantable) cardiac defibrillator: Secondary | ICD-10-CM

## 2019-04-02 DIAGNOSIS — I5042 Chronic combined systolic (congestive) and diastolic (congestive) heart failure: Secondary | ICD-10-CM

## 2019-04-02 LAB — CUP PACEART REMOTE DEVICE CHECK
Battery Remaining Longevity: 122 mo
Battery Voltage: 3.01 V
Brady Statistic RV Percent Paced: 0.1 %
Date Time Interrogation Session: 20200708062824
HighPow Impedance: 65 Ohm
Implantable Lead Implant Date: 20180116
Implantable Lead Location: 753860
Implantable Lead Model: 6935
Implantable Pulse Generator Implant Date: 20180116
Lead Channel Impedance Value: 266 Ohm
Lead Channel Impedance Value: 323 Ohm
Lead Channel Pacing Threshold Amplitude: 0.75 V
Lead Channel Pacing Threshold Pulse Width: 0.4 ms
Lead Channel Sensing Intrinsic Amplitude: 9.125 mV
Lead Channel Sensing Intrinsic Amplitude: 9.125 mV
Lead Channel Setting Pacing Amplitude: 2.5 V
Lead Channel Setting Pacing Pulse Width: 0.4 ms
Lead Channel Setting Sensing Sensitivity: 0.3 mV

## 2019-04-02 NOTE — Telephone Encounter (Signed)
Remote ICM transmission received.  Attempted call to patient regarding ICM remote transmission and left message, per DPR, to return call.    

## 2019-04-02 NOTE — Progress Notes (Signed)
EPIC Encounter for ICM Monitoring  Patient Name: Dean Limbach Sr. is a 68 y.o. male Date: 04/02/2019 Primary Care Physican: [name], MD Primary Cardiologist:[place] Electrophysiologist:[place] Last Weight: 251 lbs (at 03/10/2019 office visit)  Attempted call to patient and unable to reach.  Transmission reviewed.  Hospitalized 6/4 - 6/7 with dx of AKI.  Patient had hospital follow up visit with Oda Kilts, PA on 6/15.   OptivolThoracic impedance abnormal suggesting possible fluid accumulation since 03/11/2019 and ongoing.  Impedance starting to trend up toward baseline.     Prescribed: Furosemide40 mg take 1.5 tablets (60 mg total) by mouth twice a day.  Labs: 03/10/2019 Creatinine 2.39, BUN 61,   Potassium 4.2, Sodium 136, GFR 27-31 03/02/2019 Creatinine 2.64, BUN 79,   Potassium 4.8, Sodium 136, GFR 24-27  03/01/2019 Creatinine 3.54, BUN 103, Potassium 5.2, Sodium 135, GFR 17-19  02/28/2019 Creatinine 4.71, BUN 119, Potassium 5.2, Sodium 135, GFR 12-14  02/27/2019 Creatinine 5.92, BUN 135, Potassium 4.7, Sodium 131, GFR 9-10  A complete set of results can be found in Results Review.  Recommendations: Unable to reach.    Follow-up plan: ICM clinic phone appointment on7/27/2020 to recheck fluid levels since patient has a scheduled office visit 04/11/2019 with Dr Curt Bears.   Copy of ICM check sent to [facility].  3 month ICM trend: 04/02/2019    1 Year ICM trend:       Dean Billings, RN 04/02/2019 9:19 AM

## 2019-04-06 ENCOUNTER — Encounter (HOSPITAL_COMMUNITY): Payer: Self-pay | Admitting: *Deleted

## 2019-04-06 ENCOUNTER — Other Ambulatory Visit: Payer: Self-pay

## 2019-04-06 ENCOUNTER — Inpatient Hospital Stay (HOSPITAL_COMMUNITY)
Admission: EM | Admit: 2019-04-06 | Discharge: 2019-04-09 | DRG: 193 | Disposition: A | Discharge status: Home / Self Care | Payer: Medicare Other | Attending: Internal Medicine | Admitting: Internal Medicine

## 2019-04-06 ENCOUNTER — Emergency Department (HOSPITAL_COMMUNITY): Payer: Medicare Other

## 2019-04-06 DIAGNOSIS — I2510 Atherosclerotic heart disease of native coronary artery without angina pectoris: Secondary | ICD-10-CM | POA: Diagnosis present

## 2019-04-06 DIAGNOSIS — M109 Gout, unspecified: Secondary | ICD-10-CM | POA: Diagnosis present

## 2019-04-06 DIAGNOSIS — Z20828 Contact with and (suspected) exposure to other viral communicable diseases: Secondary | ICD-10-CM | POA: Diagnosis present

## 2019-04-06 DIAGNOSIS — Z8249 Family history of ischemic heart disease and other diseases of the circulatory system: Secondary | ICD-10-CM | POA: Diagnosis not present

## 2019-04-06 DIAGNOSIS — I10 Essential (primary) hypertension: Secondary | ICD-10-CM | POA: Diagnosis present

## 2019-04-06 DIAGNOSIS — E119 Type 2 diabetes mellitus without complications: Secondary | ICD-10-CM

## 2019-04-06 DIAGNOSIS — I255 Ischemic cardiomyopathy: Secondary | ICD-10-CM | POA: Diagnosis present

## 2019-04-06 DIAGNOSIS — Z79899 Other long term (current) drug therapy: Secondary | ICD-10-CM

## 2019-04-06 DIAGNOSIS — N183 Chronic kidney disease, stage 3 unspecified: Secondary | ICD-10-CM | POA: Diagnosis present

## 2019-04-06 DIAGNOSIS — E1122 Type 2 diabetes mellitus with diabetic chronic kidney disease: Secondary | ICD-10-CM | POA: Diagnosis present

## 2019-04-06 DIAGNOSIS — I361 Nonrheumatic tricuspid (valve) insufficiency: Secondary | ICD-10-CM | POA: Diagnosis not present

## 2019-04-06 DIAGNOSIS — E876 Hypokalemia: Secondary | ICD-10-CM | POA: Diagnosis present

## 2019-04-06 DIAGNOSIS — I480 Paroxysmal atrial fibrillation: Secondary | ICD-10-CM | POA: Diagnosis present

## 2019-04-06 DIAGNOSIS — E1169 Type 2 diabetes mellitus with other specified complication: Secondary | ICD-10-CM

## 2019-04-06 DIAGNOSIS — I5042 Chronic combined systolic (congestive) and diastolic (congestive) heart failure: Secondary | ICD-10-CM | POA: Diagnosis present

## 2019-04-06 DIAGNOSIS — R0902 Hypoxemia: Secondary | ICD-10-CM | POA: Diagnosis present

## 2019-04-06 DIAGNOSIS — I340 Nonrheumatic mitral (valve) insufficiency: Secondary | ICD-10-CM | POA: Diagnosis not present

## 2019-04-06 DIAGNOSIS — K219 Gastro-esophageal reflux disease without esophagitis: Secondary | ICD-10-CM | POA: Diagnosis present

## 2019-04-06 DIAGNOSIS — Z95810 Presence of automatic (implantable) cardiac defibrillator: Secondary | ICD-10-CM

## 2019-04-06 DIAGNOSIS — Z7901 Long term (current) use of anticoagulants: Secondary | ICD-10-CM

## 2019-04-06 DIAGNOSIS — R509 Fever, unspecified: Secondary | ICD-10-CM | POA: Diagnosis not present

## 2019-04-06 DIAGNOSIS — I2582 Chronic total occlusion of coronary artery: Secondary | ICD-10-CM | POA: Diagnosis present

## 2019-04-06 DIAGNOSIS — Z8674 Personal history of sudden cardiac arrest: Secondary | ICD-10-CM | POA: Diagnosis not present

## 2019-04-06 DIAGNOSIS — Y95 Nosocomial condition: Secondary | ICD-10-CM | POA: Diagnosis present

## 2019-04-06 DIAGNOSIS — E785 Hyperlipidemia, unspecified: Secondary | ICD-10-CM | POA: Diagnosis present

## 2019-04-06 DIAGNOSIS — Z91041 Radiographic dye allergy status: Secondary | ICD-10-CM

## 2019-04-06 DIAGNOSIS — I447 Left bundle-branch block, unspecified: Secondary | ICD-10-CM | POA: Diagnosis present

## 2019-04-06 DIAGNOSIS — Z6841 Body Mass Index (BMI) 40.0 and over, adult: Secondary | ICD-10-CM

## 2019-04-06 DIAGNOSIS — I130 Hypertensive heart and chronic kidney disease with heart failure and stage 1 through stage 4 chronic kidney disease, or unspecified chronic kidney disease: Secondary | ICD-10-CM | POA: Diagnosis present

## 2019-04-06 DIAGNOSIS — J9601 Acute respiratory failure with hypoxia: Secondary | ICD-10-CM | POA: Diagnosis present

## 2019-04-06 DIAGNOSIS — Z888 Allergy status to other drugs, medicaments and biological substances status: Secondary | ICD-10-CM

## 2019-04-06 DIAGNOSIS — J189 Pneumonia, unspecified organism: Principal | ICD-10-CM | POA: Diagnosis present

## 2019-04-06 DIAGNOSIS — G4733 Obstructive sleep apnea (adult) (pediatric): Secondary | ICD-10-CM | POA: Diagnosis present

## 2019-04-06 LAB — COMPREHENSIVE METABOLIC PANEL
ALT: 23 U/L (ref 0–44)
AST: 31 U/L (ref 15–41)
Albumin: 3.5 g/dL (ref 3.5–5.0)
Alkaline Phosphatase: 96 U/L (ref 38–126)
Anion gap: 11 (ref 5–15)
BUN: 13 mg/dL (ref 8–23)
CO2: 23 mmol/L (ref 22–32)
Calcium: 9.4 mg/dL (ref 8.9–10.3)
Chloride: 103 mmol/L (ref 98–111)
Creatinine, Ser: 1.4 mg/dL — ABNORMAL HIGH (ref 0.61–1.24)
GFR calc Af Amer: 59 mL/min — ABNORMAL LOW (ref >60)
GFR calc non Af Amer: 51 mL/min — ABNORMAL LOW (ref >60)
Glucose, Bld: 122 mg/dL — ABNORMAL HIGH (ref 70–99)
Potassium: 3.8 mmol/L (ref 3.5–5.1)
Sodium: 137 mmol/L (ref 135–145)
Total Bilirubin: 1.4 mg/dL — ABNORMAL HIGH (ref 0.3–1.2)
Total Protein: 6.9 g/dL (ref 6.5–8.1)

## 2019-04-06 LAB — CBC WITH DIFFERENTIAL/PLATELET
Abs Immature Granulocytes: 0.1 10*3/uL — ABNORMAL HIGH (ref 0.00–0.07)
Basophils Absolute: 0 10*3/uL (ref 0.0–0.1)
Basophils Relative: 0 %
Eosinophils Absolute: 0 10*3/uL (ref 0.0–0.5)
Eosinophils Relative: 0 %
HCT: 35.1 % — ABNORMAL LOW (ref 39.0–52.0)
Hemoglobin: 10.9 g/dL — ABNORMAL LOW (ref 13.0–17.0)
Immature Granulocytes: 1 %
Lymphocytes Relative: 10 %
Lymphs Abs: 1.2 10*3/uL (ref 0.7–4.0)
MCH: 28.3 pg (ref 26.0–34.0)
MCHC: 31.1 g/dL (ref 30.0–36.0)
MCV: 91.2 fL (ref 80.0–100.0)
Monocytes Absolute: 0.9 10*3/uL (ref 0.1–1.0)
Monocytes Relative: 8 %
Neutro Abs: 9.5 10*3/uL — ABNORMAL HIGH (ref 1.7–7.7)
Neutrophils Relative %: 81 %
Platelets: 204 10*3/uL (ref 150–400)
RBC: 3.85 MIL/uL — ABNORMAL LOW (ref 4.22–5.81)
RDW: 15.4 % (ref 11.5–15.5)
WBC: 11.8 10*3/uL — ABNORMAL HIGH (ref 4.0–10.5)
nRBC: 0 % (ref 0.0–0.2)

## 2019-04-06 LAB — SARS CORONAVIRUS 2 BY RT PCR (HOSPITAL ORDER, PERFORMED IN ~~LOC~~ HOSPITAL LAB): SARS Coronavirus 2: NEGATIVE

## 2019-04-06 LAB — URINALYSIS, ROUTINE W REFLEX MICROSCOPIC
Bacteria, UA: NONE SEEN
Bilirubin Urine: NEGATIVE
Glucose, UA: NEGATIVE mg/dL
Ketones, ur: 5 mg/dL — AB
Nitrite: NEGATIVE
Protein, ur: 30 mg/dL — AB
Specific Gravity, Urine: 1.014 (ref 1.005–1.030)
pH: 6 (ref 5.0–8.0)

## 2019-04-06 LAB — POCT I-STAT EG7
Acid-Base Excess: 1 mmol/L (ref 0.0–2.0)
Bicarbonate: 26.1 mmol/L (ref 20.0–28.0)
Calcium, Ion: 1.23 mmol/L (ref 1.15–1.40)
HCT: 37 % — ABNORMAL LOW (ref 39.0–52.0)
Hemoglobin: 12.6 g/dL — ABNORMAL LOW (ref 13.0–17.0)
O2 Saturation: 97 %
Potassium: 4 mmol/L (ref 3.5–5.1)
Sodium: 139 mmol/L (ref 135–145)
TCO2: 27 mmol/L (ref 22–32)
pCO2, Ven: 43.5 mmHg — ABNORMAL LOW (ref 44.0–60.0)
pH, Ven: 7.386 (ref 7.250–7.430)
pO2, Ven: 91 mmHg — ABNORMAL HIGH (ref 32.0–45.0)

## 2019-04-06 LAB — BRAIN NATRIURETIC PEPTIDE: B Natriuretic Peptide: 910.1 pg/mL — ABNORMAL HIGH (ref 0.0–100.0)

## 2019-04-06 LAB — LACTIC ACID, PLASMA
Lactic Acid, Venous: 1.7 mmol/L (ref 0.5–1.9)
Lactic Acid, Venous: 1.8 mmol/L (ref 0.5–1.9)

## 2019-04-06 LAB — PROTIME-INR
INR: 1.6 — ABNORMAL HIGH (ref 0.8–1.2)
Prothrombin Time: 19 seconds — ABNORMAL HIGH (ref 11.4–15.2)

## 2019-04-06 LAB — TROPONIN I (HIGH SENSITIVITY)
Troponin I (High Sensitivity): 146 ng/L (ref <18)
Troponin I (High Sensitivity): 215 ng/L (ref <18)

## 2019-04-06 LAB — GLUCOSE, CAPILLARY: Glucose-Capillary: 92 mg/dL (ref 70–99)

## 2019-04-06 LAB — APTT: aPTT: 39 seconds — ABNORMAL HIGH (ref 24–36)

## 2019-04-06 MED ORDER — SODIUM CHLORIDE 0.9 % IV SOLN
500.0000 mg | Freq: Once | INTRAVENOUS | Status: AC
  Administered 2019-04-06: 500 mg via INTRAVENOUS
  Filled 2019-04-06: qty 500

## 2019-04-06 MED ORDER — INSULIN ASPART 100 UNIT/ML ~~LOC~~ SOLN: 0.0000 [IU] | Freq: Three times a day (TID) | SUBCUTANEOUS | Status: DC

## 2019-04-06 MED ORDER — INSULIN ASPART 100 UNIT/ML ~~LOC~~ SOLN: 0.0000 [IU] | Freq: Every day | SUBCUTANEOUS | Status: DC

## 2019-04-06 MED ORDER — APIXABAN 5 MG PO TABS
5.0000 mg | ORAL_TABLET | Freq: Two times a day (BID) | ORAL | Status: DC
  Administered 2019-04-07 – 2019-04-09 (×5): 5 mg via ORAL
  Filled 2019-04-06 (×5): qty 1

## 2019-04-06 MED ORDER — FUROSEMIDE 10 MG/ML IJ SOLN
40.0000 mg | Freq: Once | INTRAMUSCULAR | Status: AC
  Administered 2019-04-06: 40 mg via INTRAVENOUS
  Filled 2019-04-06: qty 4

## 2019-04-06 MED ORDER — VANCOMYCIN HCL 10 G IV SOLR
1250.0000 mg | INTRAVENOUS | Status: DC
  Administered 2019-04-07: 1250 mg via INTRAVENOUS
  Filled 2019-04-06 (×2): qty 1250

## 2019-04-06 MED ORDER — VANCOMYCIN HCL 10 G IV SOLR
2000.0000 mg | Freq: Once | INTRAVENOUS | Status: AC
  Administered 2019-04-07: 2000 mg via INTRAVENOUS
  Filled 2019-04-06 (×3): qty 2000

## 2019-04-06 MED ORDER — SODIUM CHLORIDE 0.9 % IV SOLN
1.0000 g | Freq: Once | INTRAVENOUS | Status: AC
  Administered 2019-04-06: 1 g via INTRAVENOUS
  Filled 2019-04-06: qty 10

## 2019-04-06 MED ORDER — SODIUM CHLORIDE 0.9% FLUSH
3.0000 mL | Freq: Two times a day (BID) | INTRAVENOUS | Status: DC
  Administered 2019-04-06 – 2019-04-09 (×5): 3 mL via INTRAVENOUS

## 2019-04-06 MED ORDER — SODIUM CHLORIDE 0.9 % IV SOLN
250.0000 mL | INTRAVENOUS | Status: DC | PRN
  Administered 2019-04-06: 250 mL via INTRAVENOUS

## 2019-04-06 MED ORDER — SODIUM CHLORIDE 0.9 % IV SOLN
2.0000 g | Freq: Three times a day (TID) | INTRAVENOUS | Status: DC
  Administered 2019-04-07 – 2019-04-09 (×8): 2 g via INTRAVENOUS
  Filled 2019-04-06 (×10): qty 2

## 2019-04-06 MED ORDER — SODIUM CHLORIDE 0.9% FLUSH: 3.0000 mL | INTRAVENOUS | Status: DC | PRN

## 2019-04-06 NOTE — ED Notes (Signed)
ED TO INPATIENT HANDOFF REPORT  ED Nurse Name and Phone #: [phone] Threasa Beards, RN  S Name/Age/Gender Dean [name]. 68 y.o. male Room/Bed: 022C/022C  Code Status   Code Status: Full Code  Home/SNF/Other Home Patient oriented to: self, place, time and situation Is this baseline? Yes   Triage Complete: Triage complete  Chief Complaint weakness; SOB   Triage Note Pt reports fever started today.Pt also reports feeling weak.  difficult stick unable to get 2nd blood culture  Unable to get 2nd set of Blood cultures . Lab will draw   Allergies Allergies  Allergen Reactions  . Contrast Media [Iodinated Diagnostic Agents] Shortness Of Breath and Nausea And Vomiting  . Lisinopril Cough    Level of Care/Admitting Diagnosis ED Disposition    ED Disposition Condition [facility]: [place] [[id number]]  Level of Care: Telemetry Medical [104]  Covid Evaluation: Confirmed COVID Negative  Diagnosis: Acute respiratory failure with hypoxia [facility]) [[id number]]  Admitting Physician: Jani Gravel [[id number]]  Attending Physician: Jani Gravel [phone]  Estimated length of stay: past midnight tomorrow  Certification:: I certify this patient will need inpatient services for at least 2 midnights  PT Class (Do Not Modify): Inpatient [101]  PT Acc Code (Do Not Modify): Private [1]       B Medical/Surgery History Past Medical History:  Diagnosis Date  . 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 Arthritis    "maybe in his spine" (09/05/2016)  . Asthma    pt. denies  . Cardiac arrest ([place]) 08/27/2016   REQUIRING CPR, SHOCK & MEDICATIONS  . CHF (congestive heart failure) ([place])   . Coronary artery disease    90% ostRCA stenosis and total occlusion of dRCA with L-->R collaterals 2017  . Dysrhythmia   . Gout   . High cholesterol   . Hypertension   . Ischemic cardiomyopathy   . LBBB (left bundle branch block)   . OSA on CPAP   .  Type II diabetes mellitus ([place])    Past Surgical History:  Procedure Laterality Date  . CARDIAC CATHETERIZATION N/A 09/07/2016   Procedure: Left Heart Cath and Coronary Angiography;  Surgeon: Peter M Martinique, MD;  Location: [facility];  Service: Cardiovascular;  Laterality: N/A;  . COLONOSCOPY WITH PROPOFOL N/A 08/30/2018   Procedure: COLONOSCOPY WITH PROPOFOL;  Surgeon: Carol Ada, MD;  Location: [facility];  Service: Endoscopy;  Laterality: N/A;  . EP IMPLANTABLE DEVICE N/A 10/10/2016   Procedure: ICD Implant;  Surgeon: Will Meredith Leeds, MD;  Location: [facility];  Service: Cardiovascular;  Laterality: N/A;  . LUMBAR LAMINECTOMY/DECOMPRESSION MICRODISCECTOMY N/A 01/18/2018   Procedure: L2-3, L3-4, L-4-5 CENTRAL DECOMPRESSION;  Surgeon: [name], MD;  Location: [facility];  Service: Orthopedics;  Laterality: N/A;  . POLYPECTOMY  08/30/2018   Procedure: POLYPECTOMY;  Surgeon: Carol Ada, MD;  Location: [facility];  Service: Endoscopy;;     A IV Location/Drains/Wounds Patient Lines/Drains/Airways Status   Active Line/Drains/Airways    Name:   Placement date:   Placement time:   Site:   Days:   Peripheral IV 04/06/19 Left Antecubital   04/06/19    1811    Antecubital   less than 1   Incision (Closed) 01/18/18 Back Other (Comment)   01/18/18    1152     443          Intake/Output Last 24 hours No intake or output data in the 24 hours ending 04/06/19  2005  Labs/Imaging Results for orders placed or performed during the hospital encounter of 04/06/19 (from the past 48 hour(s))  Lactic acid, plasma     Status: None   Collection Time: 04/06/19  4:53 PM  Result Value Ref Range   Lactic Acid, Venous 1.7 0.5 - 1.9 mmol/L    Comment: Performed at [facility], [address] [place], [place] [address]  Comprehensive metabolic panel     Status: Abnormal   Collection Time: 04/06/19  4:59 PM  Result Value Ref Range   Sodium 137 135 - 145 mmol/L   Potassium  3.8 3.5 - 5.1 mmol/L   Chloride 103 98 - 111 mmol/L   CO2 23 22 - 32 mmol/L   Glucose, Bld 122 (H) 70 - 99 mg/dL   BUN 13 8 - 23 mg/dL   Creatinine, Ser 1.40 (H) 0.61 - 1.24 mg/dL   Calcium 9.4 8.9 - 10.3 mg/dL   Total Protein 6.9 6.5 - 8.1 g/dL   Albumin 3.5 3.5 - 5.0 g/dL   AST 31 15 - 41 U/L   ALT 23 0 - 44 U/L   Alkaline Phosphatase 96 38 - 126 U/L   Total Bilirubin 1.4 (H) 0.3 - 1.2 mg/dL   GFR calc non Af Amer 51 (L) >60 mL/min   GFR calc Af Amer 59 (L) >60 mL/min   Anion gap 11 5 - 15    Comment: Performed at [facility] [address] [place], [place] [address]  CBC WITH DIFFERENTIAL     Status: Abnormal   Collection Time: 04/06/19  4:59 PM  Result Value Ref Range   WBC 11.8 (H) 4.0 - 10.5 K/uL   RBC 3.85 (L) 4.22 - 5.81 MIL/uL   Hemoglobin 10.9 (L) 13.0 - 17.0 g/dL   HCT 35.1 (L) 39.0 - 52.0 %   MCV 91.2 80.0 - 100.0 fL   MCH 28.3 26.0 - 34.0 pg   MCHC 31.1 30.0 - 36.0 g/dL   RDW 15.4 11.5 - 15.5 %   Platelets 204 150 - 400 K/uL   nRBC 0.0 0.0 - 0.2 %   Neutrophils Relative % 81 %   Neutro Abs 9.5 (H) 1.7 - 7.7 K/uL   Lymphocytes Relative 10 %   Lymphs Abs 1.2 0.7 - 4.0 K/uL   Monocytes Relative 8 %   Monocytes Absolute 0.9 0.1 - 1.0 K/uL   Eosinophils Relative 0 %   Eosinophils Absolute 0.0 0.0 - 0.5 K/uL   Basophils Relative 0 %   Basophils Absolute 0.0 0.0 - 0.1 K/uL   Immature Granulocytes 1 %   Abs Immature Granulocytes 0.10 (H) 0.00 - 0.07 K/uL    Comment: Performed at [facility], [address] [place], [place] [address]  APTT     Status: Abnormal   Collection Time: 04/06/19  4:59 PM  Result Value Ref Range   aPTT 39 (H) 24 - 36 seconds    Comment:        IF BASELINE aPTT IS ELEVATED, SUGGEST PATIENT RISK ASSESSMENT BE USED TO DETERMINE APPROPRIATE ANTICOAGULANT THERAPY. Performed at [facility], [place] [address], [place], [place] [address]   Protime-INR     Status: Abnormal   Collection Time: 04/06/19  4:59 PM  Result  Value Ref Range   Prothrombin Time 19.0 (H) 11.4 - 15.2 seconds   INR 1.6 (H) 0.8 - 1.2    Comment: (NOTE) INR goal varies based on device  and disease states. Performed at [facility], [place] [address], [place], [place] [address]   Brain natriuretic peptide     Status: Abnormal   Collection Time: 04/06/19  4:59 PM  Result Value Ref Range   B Natriuretic Peptide 910.1 (H) 0.0 - 100.0 pg/mL    Comment: Performed at [facility] [address], [place], [place] [address]  SARS Coronavirus 2 (CEPHEID- Performed in [facility] hospital lab), Hosp Order     Status: None   Collection Time: 04/06/19  5:12 PM   Specimen: Nasopharyngeal Swab  Result Value Ref Range   SARS Coronavirus 2 NEGATIVE NEGATIVE    Comment: (NOTE) If result is NEGATIVE SARS-CoV-2 target nucleic acids are NOT DETECTED. The SARS-CoV-2 RNA is generally detectable in upper and lower  respiratory specimens during the acute phase of infection. The lowest  concentration of SARS-CoV-2 viral copies this assay can detect is 250  copies / mL. A negative result does not preclude SARS-CoV-2 infection  and should not be used as the sole basis for treatment or other  patient management decisions.  A negative result may occur with  improper specimen collection / handling, submission of specimen other  than nasopharyngeal swab, presence of viral mutation(s) within the  areas targeted by this assay, and inadequate number of viral copies  (<250 copies / mL). A negative result must be combined with clinical  observations, patient history, and epidemiological information. If result is POSITIVE SARS-CoV-2 target nucleic acids are DETECTED. The SARS-CoV-2 RNA is generally detectable in upper and lower  respiratory specimens dur ing the acute phase of infection.  Positive  results are indicative of active infection with SARS-CoV-2.  Clinical  correlation with patient history and other diagnostic information is  necessary to  determine patient infection status.  Positive results do  not rule out bacterial infection or co-infection with other viruses. If result is PRESUMPTIVE POSTIVE SARS-CoV-2 nucleic acids MAY BE PRESENT.   A presumptive positive result was obtained on the submitted specimen  and confirmed on repeat testing.  While 2019 novel coronavirus  (SARS-CoV-2) nucleic acids may be present in the submitted sample  additional confirmatory testing may be necessary for epidemiological  and / or clinical management purposes  to differentiate between  SARS-CoV-2 and other Sarbecovirus currently known to infect humans.  If clinically indicated additional testing with an alternate test  methodology [phone]) is advised. The SARS-CoV-2 RNA is generally  detectable in upper and lower respiratory sp ecimens during the acute  phase of infection. The expected result is Negative. Fact Sheet for Patients:  StrictlyIdeas.no Fact Sheet for Healthcare Providers: BankingDealers.co.za This test is not yet approved or cleared by the [place] FDA and has been authorized for detection and/or diagnosis of SARS-CoV-2 by FDA under an Emergency Use Authorization (EUA).  This EUA will remain in effect (meaning this test can be used) for the duration of the COVID-19 declaration under Section 564(b)(1) of the Act, 21 U.S.C. section 360bbb-3(b)(1), unless the authorization is terminated or revoked sooner. Performed at [facility], [place] [address], [place], [place] [address]   POCT I-Stat EG7     Status: Abnormal   Collection Time: 04/06/19  5:34 PM  Result Value Ref Range   pH, Ven 7.386 7.250 - 7.430   pCO2, Ven 43.5 (L) 44.0 - 60.0 mmHg   pO2, Ven 91.0 (H) 32.0 - 45.0 mmHg   Bicarbonate 26.1 20.0 - 28.0 mmol/L   TCO2 27  22 - 32 mmol/L   O2 Saturation 97.0 %   Acid-Base Excess 1.0 0.0 - 2.0 mmol/L   Sodium 139 135 - 145 mmol/L   Potassium 4.0 3.5 - 5.1 mmol/L    Calcium, Ion 1.23 1.15 - 1.40 mmol/L   HCT 37.0 (L) 39.0 - 52.0 %   Hemoglobin 12.6 (L) 13.0 - 17.0 g/dL   Patient temperature HIDE    Sample type VENOUS   Urinalysis, Routine w reflex microscopic     Status: Abnormal   Collection Time: 04/06/19  7:28 PM  Result Value Ref Range   Color, Urine YELLOW YELLOW   APPearance CLEAR CLEAR   Specific Gravity, Urine 1.014 1.005 - 1.030   pH 6.0 5.0 - 8.0   Glucose, UA NEGATIVE NEGATIVE mg/dL   Hgb urine dipstick SMALL (A) NEGATIVE   Bilirubin Urine NEGATIVE NEGATIVE   Ketones, ur 5 (A) NEGATIVE mg/dL   Protein, ur 30 (A) NEGATIVE mg/dL   Nitrite NEGATIVE NEGATIVE   Leukocytes,Ua SMALL (A) NEGATIVE   RBC / HPF 0-5 0 - 5 RBC/hpf   WBC, UA 6-10 0 - 5 WBC/hpf   Bacteria, UA NONE SEEN NONE SEEN   Mucus PRESENT     Comment: Performed at [facility], [place] [address], [place], [place] [address]   Dg Chest Port 1 View  Result Date: 04/06/2019 CLINICAL DATA:  Weakness and fever. EXAM: PORTABLE CHEST 1 VIEW COMPARISON:  May 15, 2018 FINDINGS: Single lead cardiac pacemaker is stable. Cardiomediastinal silhouette is enlarged. Mediastinal contours appear intact. There is no evidence of focal airspace consolidation, pleural effusion or pneumothorax. Mild increase of the interstitial markings. Osseous structures are without acute abnormality. Soft tissues are grossly normal. IMPRESSION: 1. Enlarged cardiac silhouette. 2. Mild increase of the interstitial markings which may be seen with mild interstitial pulmonary edema or atypical pneumonia. Electronically Signed   By: Fidela Salisbury M.D.   On: 04/06/2019 17:33    Pending Labs Unresulted Labs (From admission, onward)    Start     Ordered   04/07/19 0500  CBC  Tomorrow morning,   R     04/06/19 1930   04/07/19 0500  Comprehensive metabolic panel  Tomorrow morning,   R     04/06/19 1930   04/06/19 2000  HIV Antibody (routine testing w rflx)  Once,   R     04/06/19 2000   04/06/19 1930   Legionella Pneumophila Serogp 1 Ur Ag  Once,   STAT     04/06/19 1930   04/06/19 1930  Strep pneumoniae urinary antigen  Once,   STAT     04/06/19 1930   04/06/19 1627  Lactic acid, plasma  Now then every 2 hours,   STAT     04/06/19 1628   04/06/19 1627  Blood Culture (routine x 2)  BLOOD CULTURE X 2,   STAT    Question:  Patient immune status  Answer:  Normal   04/06/19 1628   04/06/19 1627  Urine culture  ONCE - STAT,   STAT    Question:  Patient immune status  Answer:  Normal   04/06/19 1628          Vitals/Pain Today's Vitals   04/06/19 1643 04/06/19 1645 04/06/19 1920 04/06/19 1930  BP:  (!) 114/42 (!) 125/41 (!) 139/53  Pulse:   87   Resp:  (!) 38 20 (!) 27  Temp:      TempSrc:  SpO2:   99%   PainSc: 0-No pain  0-No pain     Isolation Precautions No active isolations  Medications Medications  azithromycin (ZITHROMAX) 500 mg in sodium chloride 0.9 % 250 mL IVPB (has no administration in time range)  sodium chloride flush (NS) 0.9 % injection 3 mL (has no administration in time range)  sodium chloride flush (NS) 0.9 % injection 3 mL (has no administration in time range)  0.9 %  sodium chloride infusion (has no administration in time range)  insulin aspart (novoLOG) injection 0-9 Units (has no administration in time range)  insulin aspart (novoLOG) injection 0-5 Units (has no administration in time range)  cefTRIAXone (ROCEPHIN) 1 g in sodium chloride 0.9 % 100 mL IVPB (1 g Intravenous New Bag/Given 04/06/19 1912)    Mobility walks Low fall risk   Focused Assessments Pulmonary Assessment Handoff:  Lung sounds: Bilateral Breath Sounds: Diminished O2 Device: Room Air O2 Flow Rate (L/min): 2 L/min      R Recommendations: See Admitting Provider Note  Report given to:   Additional Notes:

## 2019-04-06 NOTE — Progress Notes (Addendum)
Notified provider of need to administer of antibiotics. Secure chat sent to MD, no abx ordered yet. MD replied they are holding on abx due to possibility of covid.

## 2019-04-06 NOTE — Progress Notes (Signed)
Pharmacy Antibiotic + Anticoagulation Note  Dean Stefanelli Sr. is a 68 y.o. male admitted on 04/06/2019 with SOB. He will be started on empiric antibiotics. He has a history of CKD with current SCr 1.4.  Additionally, he is on Eliquis PTA for AFib. He is on the appropriate dose given his age and weight, despite his kidney function. Last dose was this afternoon.   Plan: - Vancomycin 2 g IV x1 then 1250 mg IV q24h - Cefepime 2 g IV q8h - Eliquis 5 mg po bid - Monitor renal fx, cultures, vancomycin levels as needed    Temp (24hrs), Avg:102.6 F (39.2 C), Min:102.6 F (39.2 C), Max:102.6 F (39.2 C)  Recent Labs  Lab 04/06/19 1653 04/06/19 1659  WBC  --  11.8*  CREATININE  --  1.40*  LATICACIDVEN 1.7  --     CrCl cannot be calculated (Unknown ideal weight.).    Allergies  Allergen Reactions  . Contrast Media [Iodinated Diagnostic Agents] Shortness Of Breath and Nausea And Vomiting  . Lisinopril Cough    Antimicrobials this admission: 7/12 vancomycin > 7/12 cefepime > 7/12 azithromycin >  Dose adjustments this admission: N/A  Microbiology results: 7/12 blood cx: 7/12 urine cx: 7/12 Covid-19:  Dean [name] 04/06/2019 7:52 PM

## 2019-04-06 NOTE — ED Provider Notes (Signed)
[facility] EMERGENCY DEPARTMENT Provider Note   CSN: [id number] Arrival date & time: 04/06/19  1607     History   Chief Complaint Chief Complaint  Patient presents with   Fever    HPI Dean Marcum Sr. is a 68 y.o. male.     Patient is a 68 year old male with a significant past medical history for chronic kidney disease, coronary artery disease status post cardiac arrest, defibrillator placement, diabetes, cardiomyopathy with an EF of 30 to 35% who is presenting today with generalized weakness and shortness of breath.  Patient states yesterday was a normal day and he felt his normal self.  When he woke up this morning he was just not feeling well.  He describes feeling general malaise and weakness as well as shortness of breath.  The shortness of breath is present all the time but worse if he tries to lay down.  He has not been walking because he is short of breath just at rest.  He denies cough, congestion, chest pain, abdominal pain, nausea or vomiting.  He states in the last month and a half he has been 3 pounds up but has not had significant new weight gain.  He has not noticed significant swelling in his legs and is taking his medications as prescribed.  He denies fever and lives with his 2 daughters who have not been ill but do work outside the home.  The history is provided by the patient.  Fever   Past Medical History:  Diagnosis Date   AICD (automatic cardioverter/defibrillator) present 10/10/2016   Arthritis    "maybe in his spine" (09/05/2016)   Asthma    pt. denies   Cardiac arrest ([place]) 08/27/2016   REQUIRING CPR, SHOCK & MEDICATIONS   CHF (congestive heart failure) (HCC)    Coronary artery disease    90% ostRCA stenosis and total occlusion of dRCA with L-->R collaterals 2017   Dysrhythmia    Gout    High cholesterol    Hypertension    Ischemic cardiomyopathy    LBBB (left bundle branch block)    OSA on CPAP    Type II  diabetes mellitus ([place])     Patient Active Problem List   Diagnosis Date Noted   Acute kidney failure ([place]) 02/27/2019   Chronic combined systolic and diastolic CHF (congestive heart failure) ([place]) 02/27/2019   CKD (chronic kidney disease), stage III ([place]) 05/16/2018   Normochromic normocytic anemia 05/15/2018   CAD (coronary artery disease) 12/17/2017   Hyperlipidemia LDL goal <70 12/17/2017   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   Cardiomyopathy, ischemic    Debility 10/04/2016   Stage 3 chronic kidney disease ([place])    Disorientated    Anoxic brain injury ([place]) 09/15/2016   Benign essential HTN    OSA (obstructive sleep apnea)    Transaminitis    Chronic respiratory failure with hypoxia (HCC)    NSVT (nonsustained ventricular tachycardia) ([place])    Uncomplicated asthma    Diabetes mellitus type 2 in obese (HCC)    Acute on chronic combined systolic and diastolic CHF (congestive heart failure) (HCC)    Tachypnea    Hypokalemia    Leukocytosis    Hypoxemia    Acute respiratory failure ([place])    Cardiopulmonary arrest ([place]) 08/27/2016   DM2 (diabetes mellitus, type 2) ([place]) 03/02/2014   Morbid obesity ([place]) 03/02/2014   Essential hypertension, benign 03/02/2014    Past Surgical History:  Procedure Laterality Date  CARDIAC CATHETERIZATION N/A 09/07/2016   Procedure: Left Heart Cath and Coronary Angiography;  Surgeon: Peter M Martinique, MD;  Location: [place] [facility];  Service: Cardiovascular;  Laterality: N/A;   COLONOSCOPY WITH PROPOFOL N/A 08/30/2018   Procedure: COLONOSCOPY WITH PROPOFOL;  Surgeon: Carol Ada, MD;  Location: [facility];  Service: Endoscopy;  Laterality: N/A;   EP IMPLANTABLE DEVICE N/A 10/10/2016   Procedure: ICD Implant;  Surgeon: Will Meredith Leeds, MD;  Location: [place] [facility];  Service: Cardiovascular;  Laterality: N/A;   LUMBAR LAMINECTOMY/DECOMPRESSION MICRODISCECTOMY N/A 01/18/2018    Procedure: L2-3, L3-4, L-4-5 CENTRAL DECOMPRESSION;  Surgeon: [name], MD;  Location: [place];  Service: Orthopedics;  Laterality: N/A;   POLYPECTOMY  08/30/2018   Procedure: POLYPECTOMY;  Surgeon: Carol Ada, MD;  Location: [facility];  Service: Endoscopy;;        Home Medications    Prior to Admission medications   Medication Sig Start Date End Date Taking? Authorizing Provider  allopurinol (ZYLOPRIM) 300 MG tablet Take 300 mg by mouth daily. 08/16/16   [provider]  amiodarone (PACERONE) 200 MG tablet Take 1 tablet (200 mg total) by mouth daily. 02/18/19   Camnitz, Ocie Doyne, MD  apixaban (ELIQUIS) 5 MG TABS tablet Take 1 tablet (5 mg total) by mouth 2 (two) times daily. 12/19/18   Camnitz, Ocie Doyne, MD  atorvastatin (LIPITOR) 40 MG tablet Take 1 tablet (40 mg total) by mouth daily at 6 PM. 10/27/16   Camnitz, Ocie Doyne, MD  carvedilol (COREG) 25 MG tablet Take 1 tablet (25 mg total) by mouth 2 (two) times daily with a meal. 05/31/18   Duke, Tami Lin, PA  [employer] VITAMIN D3 1000 units capsule Take 1,000 Units by mouth daily. 08/16/16   [provider]  furosemide (LASIX) 40 MG tablet Take 1.5 tablets (60 mg total) by mouth 2 (two) times daily. 03/10/19   Shirley Friar, PA-C  magnesium oxide (MAG-OX) 400 MG tablet Take 200 mg by mouth 2 (two) times daily.    [provider]  pantoprazole (PROTONIX) 40 MG tablet Take 1 tablet (40 mg total) by mouth daily. 07/01/18   Duke, Tami Lin, PA    Family History Family History  Problem Relation Age of Onset   Heart attack Father        died in his 31's   Hypertension Sister    Cancer Brother        uncertain, "leg"   Hypertension Sister     Social History Social History   Tobacco Use   Smoking status: Never Smoker   Smokeless tobacco: Never Used  Substance Use Topics   Alcohol use: No   Drug use: No     Allergies   Contrast media [iodinated diagnostic agents] and  Lisinopril   Review of Systems Review of Systems  Constitutional: Positive for fever.  All other systems reviewed and are negative.    Physical Exam Updated Vital Signs BP (!) 137/49 (BP Location: Right Arm)    Pulse 95    Temp (!) 102.6 F (39.2 C) (Oral)    Resp (!) 32    SpO2 96% Comment: 2liters  Physical Exam Vitals signs and nursing note reviewed.  Constitutional:      General: He is not in acute distress.    Appearance: He is well-developed. He is obese.  HENT:     Head: Normocephalic and atraumatic.  Eyes:     Conjunctiva/sclera: Conjunctivae normal.  Pupils: Pupils are equal, round, and reactive to light.  Neck:     Musculoskeletal: Normal range of motion and neck supple.     Comments: No notable JVD Cardiovascular:     Rate and Rhythm: Normal rate and regular rhythm.     Pulses: Normal pulses.     Heart sounds: No murmur.  Pulmonary:     Effort: Tachypnea and accessory muscle usage present. No respiratory distress.     Breath sounds: Examination of the right-lower field reveals decreased breath sounds. Decreased breath sounds present. No wheezing or rales.  Abdominal:     General: There is no distension.     Palpations: Abdomen is soft.     Tenderness: There is no abdominal tenderness. There is no guarding or rebound.  Musculoskeletal: Normal range of motion.        General: No tenderness.     Right lower leg: No edema.     Left lower leg: No edema.  Skin:    General: Skin is warm and dry.     Capillary Refill: Capillary refill takes 2 to 3 seconds.     Findings: No erythema or rash.  Neurological:     Mental Status: He is alert and oriented to person, place, and time. Mental status is at baseline.  Psychiatric:        Mood and Affect: Mood normal.        Behavior: Behavior normal.        Thought Content: Thought content normal.      ED Treatments / Results  Labs (all labs ordered are listed, but only abnormal results are displayed) Labs  Reviewed  COMPREHENSIVE METABOLIC PANEL - Abnormal; Notable for the following components:      Result Value   Glucose, Bld 122 (*)    Creatinine, Ser 1.40 (*)    Total Bilirubin 1.4 (*)    GFR calc non Af Amer 51 (*)    GFR calc Af Amer 59 (*)    All other components within normal limits  CBC WITH DIFFERENTIAL/PLATELET - Abnormal; Notable for the following components:   WBC 11.8 (*)    RBC 3.85 (*)    Hemoglobin 10.9 (*)    HCT 35.1 (*)    Neutro Abs 9.5 (*)    Abs Immature Granulocytes 0.10 (*)    All other components within normal limits  APTT - Abnormal; Notable for the following components:   aPTT 39 (*)    All other components within normal limits  PROTIME-INR - Abnormal; Notable for the following components:   Prothrombin Time 19.0 (*)    INR 1.6 (*)    All other components within normal limits  POCT I-STAT EG7 - Abnormal; Notable for the following components:   pCO2, Ven 43.5 (*)    pO2, Ven 91.0 (*)    HCT 37.0 (*)    Hemoglobin 12.6 (*)    All other components within normal limits  SARS CORONAVIRUS 2 (HOSPITAL ORDER, [place])  CULTURE, BLOOD (ROUTINE X 2)  CULTURE, BLOOD (ROUTINE X 2)  URINE CULTURE  LACTIC ACID, PLASMA  LACTIC ACID, PLASMA  URINALYSIS, ROUTINE W REFLEX MICROSCOPIC  BRAIN NATRIURETIC PEPTIDE  I-STAT VENOUS BLOOD GAS, ED    EKG EKG Interpretation  Date/Time:  Sunday April 06 2019 16:25:24 EDT Ventricular Rate:  98 PR Interval:    QRS Duration: 140 QT Interval:  391 QTC Calculation: 500 R Axis:   -76 Text Interpretation:  Sinus  rhythm Nonspecific IVCD with LAD Anterior infarct, old No significant change since last tracing Confirmed by Blanchie Dessert [phone]) on 04/06/2019 4:33:30 PM   Radiology Dg Chest Port 1 View  Result Date: 04/06/2019 CLINICAL DATA:  Weakness and fever. EXAM: PORTABLE CHEST 1 VIEW COMPARISON:  May 15, 2018 FINDINGS: Single lead cardiac pacemaker is stable. Cardiomediastinal  silhouette is enlarged. Mediastinal contours appear intact. There is no evidence of focal airspace consolidation, pleural effusion or pneumothorax. Mild increase of the interstitial markings. Osseous structures are without acute abnormality. Soft tissues are grossly normal. IMPRESSION: 1. Enlarged cardiac silhouette. 2. Mild increase of the interstitial markings which may be seen with mild interstitial pulmonary edema or atypical pneumonia. Electronically Signed   By: Fidela Salisbury M.D.   On: 04/06/2019 17:33    Procedures Procedures (including critical care time)  Medications Ordered in ED Medications - No data to display   Initial Impression / Assessment and Plan / ED Course  I have reviewed the triage vital signs and the nursing notes.  Pertinent labs & imaging results that were available during my care of the patient were reviewed by me and considered in my medical decision making (see chart for details).        Elderly male with multiple medical problems presenting today with shortness of breath.  Patient states symptoms started when he woke up this morning and have persisted.  Patient is short of breath on exam visibly with tachypnea and mild decreased breath sounds in the right lower lobe but otherwise lung sounds are clear.  He has no history of COPD or asthma and does not use inhalers at home or use cigarettes.  He has a significant cardiac history of dysrhythmia, cardiac arrest, cardiomyopathy with a EF of 30 to 35%.  He does take Eliquis and Lasix and has taken those doses today as well and has not had any change in medication recently.  Patient denies any chest pain and low suspicion at this time for PE or dissection.  Patient's EKG is unchanged with a left bundle branch block and although he denies having fever on exam here he is warm to the touch and temperature of 102.6.  Lower suspicion for ACS at this time.  However concern for pneumonia or COVID.  Sepsis order set and COVID  testing initiated.  Patient was mildly hypoxic from 89 to 91% and tachypneic at 40 initially.  He was placed on 2 L of oxygen with improvement of oxygen saturation to 97% and improvement of respirations to 30.  Patient given Tylenol for fever.  Given his tenuous fluid status and possible COVID will hold on giving fluid bolus at this time as blood pressure is currently stable.  CXR with mild increase of interstitial markings which may be seen with pulmonary edema or atypical pneumonia.  Given patient has no distal edema and new fever concern for pneumonia.  Lactic acid within normal limits, BC with mild leukocytosis of 11.8, CMP without acute findings, VBG with mild decreased CO2 but otherwise normal.  COVID negative and BMP still pending.  Patient given Rocephin and azithromycin.  On repeat exam fever has now defervesced and patient is breathing more comfortably.  Sats are still in the high 90s on 2 L.  He is still however tachypneic in the high 20s and low 30s.  We will plan to admit for ongoing care and treatment of pneumonia and hypoxia.  Dean Breithaupt Sr. was evaluated in Emergency Department on 04/06/2019 for the symptoms  described in the history of present illness. He was evaluated in the context of the global COVID-19 pandemic, which necessitated consideration that the patient might be at risk for infection with the SARS-CoV-2 virus that causes COVID-19. Institutional protocols and algorithms that pertain to the evaluation of patients at risk for COVID-19 are in a state of rapid change based on information released by regulatory bodies including the CDC and federal and state organizations. These policies and algorithms were followed during the patient's care in the ED.   Final Clinical Impressions(s) / ED Diagnoses   Final diagnoses:  Atypical pneumonia  Hypoxia    ED Discharge Orders    None       Blanchie Dessert, MD 04/06/19 1850

## 2019-04-06 NOTE — ED Triage Notes (Signed)
Unable to get 2nd set of Blood cultures . Lab will draw

## 2019-04-06 NOTE — Progress Notes (Signed)
Pt admitted to unit, no concerns or complaints. Patient updated on plan of care. Will continue to monitor.

## 2019-04-06 NOTE — ED Triage Notes (Signed)
difficult stick unable to get 2nd blood culture

## 2019-04-06 NOTE — ED Notes (Signed)
Date and time results received: 04/06/19 2040 (use smartphrase ".now" to insert current time)  Test: tropoin Critical Value: 146  Name of Provider Notified: Dr. Maudie Mercury  Orders Received? Or Actions Taken?: Actions Taken: monitor

## 2019-04-06 NOTE — H&P (Addendum)
TRH H&P    Patient Demographics:    Dean [name], is a 68 y.o. male  MRN: [id number]  DOB - 30-Jul-1951  Admit Date - 04/06/2019  Referring MD/NP/PA:  Blanchie Dessert  Outpatient Primary MD for the patient is [name], MD  Patient coming from:  home  Chief complaint- fever   HPI:    Dean [name]  is a 68 y.o. 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  Pt states fever began today. Slight dyspnea, might be slightly worse with lying down.  Pt denies pnd, wt gain, lower ext edema.  Pt denies cp, palp, n/v, abd pain, diarrhea, brbpr, dysuria, hematuria.  Pt can't recall any sick contacts, presented to ED due to fever.   In ED,  T 102.6  P 95  R 32  Bp 137/49  Pox 97% Pox 88% on RA  CXR IMPRESSION: 1. Enlarged cardiac silhouette. 2. Mild increase of the interstitial markings which may be seen with mild interstitial pulmonary edema or atypical pneumonia.  covid 19 negative  Lactic acid 1.7 Wbc 11.8, Hgb 10.9, Plt 204 Na 137, K 3.8, Bun 13, Creatinine 1.40 Ast 31, Alt 23, Alk phos 96, T. Bili 1.4 INR 1.6, PTT 39 BNP 910.1  Blood culture x2 pending  Trop pending  ekg nsr at 95, nl axis, nl pr int, prolonged QTC, Q in v1-3  Pt given rocephin, zithromax iv x1 in the ED.   Pt will be admitted for Acute respiratory failure w hypoxia, Hcap     Review of systems:    In addition to the HPI above,   No Headache, No changes with Vision or hearing, No problems swallowing food or Liquids, No Chest pain, No Cough No Abdominal pain, No Nausea or Vomiting, bowel movements are regular, No Blood in stool or Urine, No dysuria, No new skin rashes or bruises, No new joints  pains-aches,  No new weakness, tingling, numbness in any extremity, No recent weight gain or loss, No polyuria, polydypsia or polyphagia, No significant Mental Stressors.  All other systems reviewed and are negative.    Past History of the following :    Past Medical History:  Diagnosis Date  . 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 Arthritis    "maybe in his spine" (09/05/2016)  . Asthma    pt. denies  . Cardiac arrest ([place]) 08/27/2016   REQUIRING CPR, SHOCK & MEDICATIONS  . CHF (congestive heart failure) ([place])   . Coronary artery disease    90% ostRCA stenosis and total occlusion of dRCA with L-->R collaterals 2017  . Dysrhythmia   . Gout   . High cholesterol   . Hypertension   . Ischemic cardiomyopathy   . LBBB (left bundle branch block)   . OSA on CPAP   . Type II diabetes mellitus (  [facility])       Past Surgical History:  Procedure Laterality Date  . CARDIAC CATHETERIZATION N/A 09/07/2016   Procedure: Left Heart Cath and Coronary Angiography;  Surgeon: Peter M Martinique, MD;  Location: [facility];  Service: Cardiovascular;  Laterality: N/A;  . COLONOSCOPY WITH PROPOFOL N/A 08/30/2018   Procedure: COLONOSCOPY WITH PROPOFOL;  Surgeon: Carol Ada, MD;  Location: [facility];  Service: Endoscopy;  Laterality: N/A;  . EP IMPLANTABLE DEVICE N/A 10/10/2016   Procedure: ICD Implant;  Surgeon: Will Meredith Leeds, MD;  Location: [facility];  Service: Cardiovascular;  Laterality: N/A;  . LUMBAR LAMINECTOMY/DECOMPRESSION MICRODISCECTOMY N/A 01/18/2018   Procedure: L2-3, L3-4, L-4-5 CENTRAL DECOMPRESSION;  Surgeon: [name], MD;  Location: [place];  Service: Orthopedics;  Laterality: N/A;  . POLYPECTOMY  08/30/2018   Procedure: POLYPECTOMY;  Surgeon: Carol Ada, MD;  Location: [facility];  Service: Endoscopy;;      Social History:      Social History   Tobacco Use  . Smoking status: Never Smoker  . Smokeless tobacco: Never  Used  Substance Use Topics  . Alcohol use: No       Family History :     Family History  Problem Relation Age of Onset  . Heart attack Father        died in his 17's  . Hypertension Sister   . Cancer Brother        uncertain, "leg"  . Hypertension Sister        Home Medications:   Prior to Admission medications   Medication Sig Start Date End Date Taking? Authorizing Provider  allopurinol (ZYLOPRIM) 300 MG tablet Take 300 mg by mouth daily. 08/16/16   [provider]  amiodarone (PACERONE) 200 MG tablet Take 1 tablet (200 mg total) by mouth daily. 02/18/19   Camnitz, Ocie Doyne, MD  apixaban (ELIQUIS) 5 MG TABS tablet Take 1 tablet (5 mg total) by mouth 2 (two) times daily. 12/19/18   Camnitz, Ocie Doyne, MD  atorvastatin (LIPITOR) 40 MG tablet Take 1 tablet (40 mg total) by mouth daily at 6 PM. 10/27/16   Camnitz, Ocie Doyne, MD  carvedilol (COREG) 25 MG tablet Take 1 tablet (25 mg total) by mouth 2 (two) times daily with a meal. 05/31/18   Duke, Tami Lin, PA  [facility] VITAMIN D3 1000 units capsule Take 1,000 Units by mouth daily. 08/16/16   [provider]  furosemide (LASIX) 40 MG tablet Take 1.5 tablets (60 mg total) by mouth 2 (two) times daily. 03/10/19   Shirley Friar, PA-C  magnesium oxide (MAG-OX) 400 MG tablet Take 200 mg by mouth 2 (two) times daily.    [provider]  pantoprazole (PROTONIX) 40 MG tablet Take 1 tablet (40 mg total) by mouth daily. 07/01/18   Ledora Bottcher, PA     Allergies:     Allergies  Allergen Reactions  . Contrast Media [Iodinated Diagnostic Agents] Shortness Of Breath and Nausea And Vomiting  . Lisinopril Cough     Physical Exam:   Vitals  Blood pressure (!) 139/53, pulse 87, temperature (!) 102.6 F (39.2 C), temperature source Oral, resp. rate (!) 27, SpO2 99 %.  1.  General: Axoxo3,    2. Psychiatric: euthymic  3. Neurologic: cn2-12 intact, reflexes 2+symmetric, diffuse with no  clonus, motor 5/5 in all 4 ext  4. HEENMT:  Anicteric, pupils 1.30m symmetric, direct, consensual, near intact Neck: no jvd, no bruit  5.  Respiratory : Slight bibasilar crackles, no wheezing  6. Cardiovascular : rrr s1, s2, 2/6 sem apex  7. Gastrointestinal:  Abd: soft, obese, nt, nd, +bs  8. Skin:  Ext: no c/c/e,  No rash  9.Musculoskeletal:  Good ROM  No adenopathy    Data Review:    CBC Recent Labs  Lab 04/06/19 1659 04/06/19 1734  WBC 11.8*  --   HGB 10.9* 12.6*  HCT 35.1* 37.0*  PLT 204  --   MCV 91.2  --   MCH 28.3  --   MCHC 31.1  --   RDW 15.4  --   LYMPHSABS 1.2  --   MONOABS 0.9  --   EOSABS 0.0  --   BASOSABS 0.0  --    ------------------------------------------------------------------------------------------------------------------  Results for orders placed or performed during the hospital encounter of 04/06/19 (from the past 48 hour(s))  Lactic acid, plasma     Status: None   Collection Time: 04/06/19  4:53 PM  Result Value Ref Range   Lactic Acid, Venous 1.7 0.5 - 1.9 mmol/L    Comment: Performed at [facility], [place] [address], [place], [place] [address]  Comprehensive metabolic panel     Status: Abnormal   Collection Time: 04/06/19  4:59 PM  Result Value Ref Range   Sodium 137 135 - 145 mmol/L   Potassium 3.8 3.5 - 5.1 mmol/L   Chloride 103 98 - 111 mmol/L   CO2 23 22 - 32 mmol/L   Glucose, Bld 122 (H) 70 - 99 mg/dL   BUN 13 8 - 23 mg/dL   Creatinine, Ser 1.40 (H) 0.61 - 1.24 mg/dL   Calcium 9.4 8.9 - 10.3 mg/dL   Total Protein 6.9 6.5 - 8.1 g/dL   Albumin 3.5 3.5 - 5.0 g/dL   AST 31 15 - 41 U/L   ALT 23 0 - 44 U/L   Alkaline Phosphatase 96 38 - 126 U/L   Total Bilirubin 1.4 (H) 0.3 - 1.2 mg/dL   GFR calc non Af Amer 51 (L) >60 mL/min   GFR calc Af Amer 59 (L) >60 mL/min   Anion gap 11 5 - 15    Comment: Performed at [facility] [address] [place], [place] [address]  CBC WITH DIFFERENTIAL     Status: Abnormal    Collection Time: 04/06/19  4:59 PM  Result Value Ref Range   WBC 11.8 (H) 4.0 - 10.5 K/uL   RBC 3.85 (L) 4.22 - 5.81 MIL/uL   Hemoglobin 10.9 (L) 13.0 - 17.0 g/dL   HCT 35.1 (L) 39.0 - 52.0 %   MCV 91.2 80.0 - 100.0 fL   MCH 28.3 26.0 - 34.0 pg   MCHC 31.1 30.0 - 36.0 g/dL   RDW 15.4 11.5 - 15.5 %   Platelets 204 150 - 400 K/uL   nRBC 0.0 0.0 - 0.2 %   Neutrophils Relative % 81 %   Neutro Abs 9.5 (H) 1.7 - 7.7 K/uL   Lymphocytes Relative 10 %   Lymphs Abs 1.2 0.7 - 4.0 K/uL   Monocytes Relative 8 %   Monocytes Absolute 0.9 0.1 - 1.0 K/uL   Eosinophils Relative 0 %   Eosinophils Absolute 0.0 0.0 - 0.5 K/uL   Basophils Relative 0 %   Basophils Absolute 0.0 0.0 - 0.1 K/uL   Immature Granulocytes 1 %   Abs Immature Granulocytes 0.10 (H) 0.00 - 0.07 K/uL    Comment: Performed at [facility], [place] [address] [place], [place] [address]  APTT     Status: Abnormal   Collection Time: 04/06/19  4:59 PM  Result Value Ref Range   aPTT 39 (H) 24 - 36 seconds    Comment:        IF BASELINE aPTT IS ELEVATED, SUGGEST PATIENT RISK ASSESSMENT BE USED TO DETERMINE APPROPRIATE ANTICOAGULANT THERAPY. Performed at [facility], [place] [address] [place], [place] [address]   Protime-INR     Status: Abnormal   Collection Time: 04/06/19  4:59 PM  Result Value Ref Range   Prothrombin Time 19.0 (H) 11.4 - 15.2 seconds   INR 1.6 (H) 0.8 - 1.2    Comment: (NOTE) INR goal varies based on device and disease states. Performed at [facility], [place] [address], [place], [place] [address]   Brain natriuretic peptide     Status: Abnormal   Collection Time: 04/06/19  4:59 PM  Result Value Ref Range   B Natriuretic Peptide 910.1 (H) 0.0 - 100.0 pg/mL    Comment: Performed at [facility] [address] [place], [place] [address]  SARS Coronavirus 2 (CEPHEID- Performed in [place] hospital lab), Hosp Order     Status: None   Collection Time: 04/06/19  5:12 PM   Specimen:  Nasopharyngeal Swab  Result Value Ref Range   SARS Coronavirus 2 NEGATIVE NEGATIVE    Comment: (NOTE) If result is NEGATIVE SARS-CoV-2 target nucleic acids are NOT DETECTED. The SARS-CoV-2 RNA is generally detectable in upper and lower  respiratory specimens during the acute phase of infection. The lowest  concentration of SARS-CoV-2 viral copies this assay can detect is 250  copies / mL. A negative result does not preclude SARS-CoV-2 infection  and should not be used as the sole basis for treatment or other  patient management decisions.  A negative result may occur with  improper specimen collection / handling, submission of specimen other  than nasopharyngeal swab, presence of viral mutation(s) within the  areas targeted by this assay, and inadequate number of viral copies  (<250 copies / mL). A negative result must be combined with clinical  observations, patient history, and epidemiological information. If result is POSITIVE SARS-CoV-2 target nucleic acids are DETECTED. The SARS-CoV-2 RNA is generally detectable in upper and lower  respiratory specimens dur ing the acute phase of infection.  Positive  results are indicative of active infection with SARS-CoV-2.  Clinical  correlation with patient history and other diagnostic information is  necessary to determine patient infection status.  Positive results do  not rule out bacterial infection or co-infection with other viruses. If result is PRESUMPTIVE POSTIVE SARS-CoV-2 nucleic acids MAY BE PRESENT.   A presumptive positive result was obtained on the submitted specimen  and confirmed on repeat testing.  While 2019 novel coronavirus  (SARS-CoV-2) nucleic acids may be present in the submitted sample  additional confirmatory testing may be necessary for epidemiological  and / or clinical management purposes  to differentiate between  SARS-CoV-2 and other Sarbecovirus currently known to infect humans.  If clinically indicated  additional testing with an alternate test  methodology [phone]) is advised. The SARS-CoV-2 RNA is generally  detectable in upper and lower respiratory sp ecimens during the acute  phase of infection. The expected result is Negative. Fact Sheet for Patients:  StrictlyIdeas.no Fact Sheet for Healthcare Providers: BankingDealers.co.za This test is not yet approved or cleared by the [place] FDA and has  been authorized for detection and/or diagnosis of SARS-CoV-2 by FDA under an Emergency Use Authorization (EUA).  This EUA will remain in effect (meaning this test can be used) for the duration of the COVID-19 declaration under Section 564(b)(1) of the Act, 21 U.S.C. section 360bbb-3(b)(1), unless the authorization is terminated or revoked sooner. Performed at [facility], [place] [address] [place], [place] [address]   POCT I-Stat EG7     Status: Abnormal   Collection Time: 04/06/19  5:34 PM  Result Value Ref Range   pH, Ven 7.386 7.250 - 7.430   pCO2, Ven 43.5 (L) 44.0 - 60.0 mmHg   pO2, Ven 91.0 (H) 32.0 - 45.0 mmHg   Bicarbonate 26.1 20.0 - 28.0 mmol/L   TCO2 27 22 - 32 mmol/L   O2 Saturation 97.0 %   Acid-Base Excess 1.0 0.0 - 2.0 mmol/L   Sodium 139 135 - 145 mmol/L   Potassium 4.0 3.5 - 5.1 mmol/L   Calcium, Ion 1.23 1.15 - 1.40 mmol/L   HCT 37.0 (L) 39.0 - 52.0 %   Hemoglobin 12.6 (L) 13.0 - 17.0 g/dL   Patient temperature HIDE    Sample type VENOUS   Urinalysis, Routine w reflex microscopic     Status: Abnormal   Collection Time: 04/06/19  7:28 PM  Result Value Ref Range   Color, Urine YELLOW YELLOW   APPearance CLEAR CLEAR   Specific Gravity, Urine 1.014 1.005 - 1.030   pH 6.0 5.0 - 8.0   Glucose, UA NEGATIVE NEGATIVE mg/dL   Hgb urine dipstick SMALL (A) NEGATIVE   Bilirubin Urine NEGATIVE NEGATIVE   Ketones, ur 5 (A) NEGATIVE mg/dL   Protein, ur 30 (A) NEGATIVE mg/dL   Nitrite NEGATIVE NEGATIVE    Leukocytes,Ua SMALL (A) NEGATIVE   RBC / HPF 0-5 0 - 5 RBC/hpf   WBC, UA 6-10 0 - 5 WBC/hpf   Bacteria, UA NONE SEEN NONE SEEN   Mucus PRESENT     Comment: Performed at [facility], [address], [place], [place] [address]    Chemistries  Recent Labs  Lab 04/06/19 1659 04/06/19 1734  NA 137 139  K 3.8 4.0  CL 103  --   CO2 23  --   GLUCOSE 122*  --   BUN 13  --   CREATININE 1.40*  --   CALCIUM 9.4  --   AST 31  --   ALT 23  --   ALKPHOS 96  --   BILITOT 1.4*  --    ------------------------------------------------------------------------------------------------------------------  ------------------------------------------------------------------------------------------------------------------ GFR: CrCl cannot be calculated (Unknown ideal weight.). Liver Function Tests: Recent Labs  Lab 04/06/19 1659  AST 31  ALT 23  ALKPHOS 96  BILITOT 1.4*  PROT 6.9  ALBUMIN 3.5   No results for input(s): LIPASE, AMYLASE in the last 168 hours. No results for input(s): AMMONIA in the last 168 hours. Coagulation Profile: Recent Labs  Lab 04/06/19 1659  INR 1.6*   Cardiac Enzymes: No results for input(s): CKTOTAL, CKMB, CKMBINDEX, TROPONINI in the last 168 hours. BNP (last 3 results) No results for input(s): PROBNP in the last 8760 hours. HbA1C: No results for input(s): HGBA1C in the last 72 hours. CBG: No results for input(s): GLUCAP in the last 168 hours. Lipid Profile: No results for input(s): CHOL, HDL, LDLCALC, TRIG, CHOLHDL, LDLDIRECT in the last 72 hours. Thyroid Function Tests: No results for input(s): TSH, T4TOTAL, FREET4, T3FREE, THYROIDAB in the last 72 hours. Anemia Panel: No  results for input(s): VITAMINB12, FOLATE, FERRITIN, TIBC, IRON, RETICCTPCT in the last 72 hours.  --------------------------------------------------------------------------------------------------------------- Urine analysis:    Component Value Date/Time   COLORURINE YELLOW  04/06/2019 1928   APPEARANCEUR CLEAR 04/06/2019 1928   LABSPEC 1.014 04/06/2019 1928   PHURINE 6.0 04/06/2019 1928   GLUCOSEU NEGATIVE 04/06/2019 1928   HGBUR SMALL (A) 04/06/2019 1928   BILIRUBINUR NEGATIVE 04/06/2019 1928   KETONESUR 5 (A) 04/06/2019 1928   PROTEINUR 30 (A) 04/06/2019 1928   NITRITE NEGATIVE 04/06/2019 1928   LEUKOCYTESUR SMALL (A) 04/06/2019 1928      Imaging Results:    Dg Chest Port 1 View  Result Date: 04/06/2019 CLINICAL DATA:  Weakness and fever. EXAM: PORTABLE CHEST 1 VIEW COMPARISON:  May 15, 2018 FINDINGS: Single lead cardiac pacemaker is stable. Cardiomediastinal silhouette is enlarged. Mediastinal contours appear intact. There is no evidence of focal airspace consolidation, pleural effusion or pneumothorax. Mild increase of the interstitial markings. Osseous structures are without acute abnormality. Soft tissues are grossly normal. IMPRESSION: 1. Enlarged cardiac silhouette. 2. Mild increase of the interstitial markings which may be seen with mild interstitial pulmonary edema or atypical pneumonia. Electronically Signed   By: Fidela Salisbury M.D.   On: 04/06/2019 17:33       Assessment & Plan:    Principal Problem:   Acute respiratory failure with hypoxia (HCC) Active Problems:   DM2 (diabetes mellitus, type 2) (HCC)   Essential hypertension, benign   Fever   Stage 3 chronic kidney disease (HCC)   CAD (coronary artery disease)   Chronic combined systolic and diastolic CHF (congestive heart failure) (HCC)   HCAP (healthcare-associated pneumonia)  Fever HCAP, Acute Respiratory Failure w Hypoxia Urinalysis pending Blood culture x2 Urine strep antigen, urine legionella antigen vanco iv, cefepime iv pharmacy to dose  Pafib, CAD, CHF (EF 30-35%),  moderate MR Tele Trop I q2hx2 Check cardiac echo Lasix 72m iv x1 since receiving fluid with iv abx Cont Lipitor 458mpo qhs Cont Lasix 6056mo bid Cont Amiodarone 200m48m qday Cont  Carvedilol 25mg47mbid Cont Eliquis , pharmacy to dose   OSA on CPAP  Stage 3, CKD Check cmp in am  Dm2 (pt not currently on medication) Check hga1c fsbs ac and qhs, ISS  Gerd Cont PPI   DVT Prophylaxis-   Eliquis   AM Labs Ordered, also please review Full Orders  Family Communication: Admission, patients condition and plan of care including tests being ordered have been discussed with the patient and  who indicate understanding and agree with the plan and Code Status.  Code Status:  FULL CODE,  Notified daughter that her father is being admitted to [facility] Pad[facility] status: Inpatient: Based on patients clinical presentation and evaluation of above clinical data, I have made determination that patient meets Inpatient criteria at this time. Pt has acute respiratory failure with hypoxia and fever, secondary to Hcap.  Pt has high risk of clinical deterioration in light of his hx of CHF (EF 30%), and therefore will require close monitoring of fluid status, treating his condition will likely take >2 nites stay and therefore requires inpatient status.   Time spent in minutes :  70   JamesJani Gravelon 04/06/2019 at 8:23 PM

## 2019-04-06 NOTE — ED Triage Notes (Signed)
Pt reports fever started today.Pt also reports feeling weak.

## 2019-04-07 ENCOUNTER — Inpatient Hospital Stay (HOSPITAL_COMMUNITY): Payer: Medicare Other

## 2019-04-07 DIAGNOSIS — J9601 Acute respiratory failure with hypoxia: Secondary | ICD-10-CM

## 2019-04-07 DIAGNOSIS — J189 Pneumonia, unspecified organism: Principal | ICD-10-CM

## 2019-04-07 DIAGNOSIS — I2510 Atherosclerotic heart disease of native coronary artery without angina pectoris: Secondary | ICD-10-CM

## 2019-04-07 DIAGNOSIS — I361 Nonrheumatic tricuspid (valve) insufficiency: Secondary | ICD-10-CM

## 2019-04-07 DIAGNOSIS — N183 Chronic kidney disease, stage 3 (moderate): Secondary | ICD-10-CM

## 2019-04-07 DIAGNOSIS — I340 Nonrheumatic mitral (valve) insufficiency: Secondary | ICD-10-CM

## 2019-04-07 DIAGNOSIS — R509 Fever, unspecified: Secondary | ICD-10-CM

## 2019-04-07 DIAGNOSIS — E1122 Type 2 diabetes mellitus with diabetic chronic kidney disease: Secondary | ICD-10-CM

## 2019-04-07 DIAGNOSIS — I5042 Chronic combined systolic (congestive) and diastolic (congestive) heart failure: Secondary | ICD-10-CM

## 2019-04-07 LAB — URINE CULTURE
Culture: NO GROWTH
Special Requests: NORMAL

## 2019-04-07 LAB — COMPREHENSIVE METABOLIC PANEL
ALT: 27 U/L (ref 0–44)
AST: 31 U/L (ref 15–41)
Albumin: 3.1 g/dL — ABNORMAL LOW (ref 3.5–5.0)
Alkaline Phosphatase: 79 U/L (ref 38–126)
Anion gap: 11 (ref 5–15)
BUN: 15 mg/dL (ref 8–23)
CO2: 22 mmol/L (ref 22–32)
Calcium: 8.9 mg/dL (ref 8.9–10.3)
Chloride: 104 mmol/L (ref 98–111)
Creatinine, Ser: 1.42 mg/dL — ABNORMAL HIGH (ref 0.61–1.24)
GFR calc Af Amer: 58 mL/min — ABNORMAL LOW (ref >60)
GFR calc non Af Amer: 50 mL/min — ABNORMAL LOW (ref >60)
Glucose, Bld: 105 mg/dL — ABNORMAL HIGH (ref 70–99)
Potassium: 3.2 mmol/L — ABNORMAL LOW (ref 3.5–5.1)
Sodium: 137 mmol/L (ref 135–145)
Total Bilirubin: 1.1 mg/dL (ref 0.3–1.2)
Total Protein: 6.3 g/dL — ABNORMAL LOW (ref 6.5–8.1)

## 2019-04-07 LAB — ECHOCARDIOGRAM COMPLETE
Height: 66 in
Weight: 4203.2 oz

## 2019-04-07 LAB — GLUCOSE, CAPILLARY
Glucose-Capillary: 98 mg/dL (ref 70–99)
Glucose-Capillary: 111 mg/dL — ABNORMAL HIGH (ref 70–99)
Glucose-Capillary: 96 mg/dL (ref 70–99)
Glucose-Capillary: 100 mg/dL — ABNORMAL HIGH (ref 70–99)

## 2019-04-07 LAB — MRSA PCR SCREENING: MRSA by PCR: NEGATIVE

## 2019-04-07 LAB — STREP PNEUMONIAE URINARY ANTIGEN: Strep Pneumo Urinary Antigen: NEGATIVE

## 2019-04-07 LAB — CBC
HCT: 33 % — ABNORMAL LOW (ref 39.0–52.0)
Hemoglobin: 10.4 g/dL — ABNORMAL LOW (ref 13.0–17.0)
MCH: 29 pg (ref 26.0–34.0)
MCHC: 31.5 g/dL (ref 30.0–36.0)
MCV: 91.9 fL (ref 80.0–100.0)
Platelets: 158 10*3/uL (ref 150–400)
RBC: 3.59 MIL/uL — ABNORMAL LOW (ref 4.22–5.81)
RDW: 15.5 % (ref 11.5–15.5)
WBC: 9.1 10*3/uL (ref 4.0–10.5)
nRBC: 0 % (ref 0.0–0.2)

## 2019-04-07 LAB — HIV ANTIBODY (ROUTINE TESTING W REFLEX): HIV Screen 4th Generation wRfx: NONREACTIVE

## 2019-04-07 MED ORDER — POTASSIUM CHLORIDE CRYS ER 20 MEQ PO TBCR
40.0000 meq | EXTENDED_RELEASE_TABLET | Freq: Every day | ORAL | Status: DC
  Administered 2019-04-07 – 2019-04-09 (×3): 40 meq via ORAL
  Filled 2019-04-07 (×3): qty 2

## 2019-04-07 MED ORDER — VITAMIN D 25 MCG (1000 UNIT) PO TABS
1000.0000 [IU] | ORAL_TABLET | Freq: Every day | ORAL | Status: DC
  Administered 2019-04-07 – 2019-04-09 (×3): 1000 [IU] via ORAL
  Filled 2019-04-07 (×4): qty 1

## 2019-04-07 MED ORDER — CARVEDILOL 25 MG PO TABS
25.0000 mg | ORAL_TABLET | Freq: Two times a day (BID) | ORAL | Status: DC
  Administered 2019-04-07 – 2019-04-09 (×5): 25 mg via ORAL
  Filled 2019-04-07 (×6): qty 1

## 2019-04-07 MED ORDER — MAGNESIUM OXIDE 400 (241.3 MG) MG PO TABS
200.0000 mg | ORAL_TABLET | Freq: Two times a day (BID) | ORAL | Status: DC
  Administered 2019-04-07 – 2019-04-09 (×6): 200 mg via ORAL
  Filled 2019-04-07 (×5): qty 1

## 2019-04-07 MED ORDER — PANTOPRAZOLE SODIUM 40 MG PO TBEC
40.0000 mg | DELAYED_RELEASE_TABLET | Freq: Every day | ORAL | Status: DC
  Administered 2019-04-07 – 2019-04-09 (×3): 40 mg via ORAL
  Filled 2019-04-07 (×4): qty 1

## 2019-04-07 MED ORDER — AMIODARONE HCL 200 MG PO TABS
200.0000 mg | ORAL_TABLET | Freq: Every day | ORAL | Status: DC
  Administered 2019-04-07 – 2019-04-09 (×3): 200 mg via ORAL
  Filled 2019-04-07 (×3): qty 1

## 2019-04-07 MED ORDER — PERFLUTREN LIPID MICROSPHERE
1.0000 mL | INTRAVENOUS | Status: AC | PRN
  Administered 2019-04-07: 10:00:00 2 mL via INTRAVENOUS
  Filled 2019-04-07: qty 10

## 2019-04-07 MED ORDER — ATORVASTATIN CALCIUM 40 MG PO TABS
40.0000 mg | ORAL_TABLET | Freq: Every day | ORAL | Status: DC
  Administered 2019-04-07 – 2019-04-08 (×2): 40 mg via ORAL
  Filled 2019-04-07 (×2): qty 1

## 2019-04-07 MED ORDER — ALLOPURINOL 300 MG PO TABS
300.0000 mg | ORAL_TABLET | Freq: Every day | ORAL | Status: DC
  Administered 2019-04-07 – 2019-04-09 (×3): 300 mg via ORAL
  Filled 2019-04-07 (×3): qty 1

## 2019-04-07 MED ORDER — FUROSEMIDE 40 MG PO TABS
60.0000 mg | ORAL_TABLET | Freq: Two times a day (BID) | ORAL | Status: DC
  Administered 2019-04-07 – 2019-04-09 (×5): 60 mg via ORAL
  Filled 2019-04-07 (×5): qty 1

## 2019-04-07 NOTE — Progress Notes (Signed)
  Echocardiogram 2D Echocardiogram has been performed.  Jenni Thew G Rachael Ferrie 04/07/2019, 10:06 AM

## 2019-04-07 NOTE — Plan of Care (Signed)
  Problem: Education: °Goal: Knowledge of General Education information will improve °Description: Including pain rating scale, medication(s)/side effects and non-pharmacologic comfort measures °Outcome: Progressing °  °Problem: Pain Managment: °Goal: General experience of comfort will improve °Outcome: Progressing °  °Problem: Safety: °Goal: Ability to remain free from injury will improve °Outcome: Progressing °  °Problem: Activity: °Goal: Risk for activity intolerance will decrease °Outcome: Progressing °  °

## 2019-04-07 NOTE — Progress Notes (Signed)
PROGRESS NOTE  Dean Bones Sr. RUE:[id number] DOB: 1951/09/24 DOA: 04/06/2019 PCP: [name], MD   LOS: 1 day   Brief narrative:  Dean [name]  is a 68 y.o. 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  Pt denied pnd, wt gain, lower ext edema. In ED, T 102.6  P 95  R 32  Bp 137/49  Pox 97%. Pox 88% on RA.  WBC was 11.8.  BNP at 910.  COVID-19 was negative.  Patient was noted to have multifocal pneumonia and was considered for admission to the hospital for acute respiratory failure with hypoxia..  Subjective: Patient feels little better with breathing today.  Denies any chest pain, palpitation.  Has mild cough.  No fever or chills.  Assessment/Plan:  Principal Problem:   Acute respiratory failure with hypoxia (HCC) Active Problems:   DM2 (diabetes mellitus, type 2) (HCC)   Essential hypertension, benign   Fever   Stage 3 chronic kidney disease (HCC)   CAD (coronary artery disease)   Chronic combined systolic and diastolic CHF (congestive heart failure) ([place])   HCAP (healthcare-associated pneumonia)  Acute Respiratory Failure w Hypoxia likely secondary to multifocal pneumonia. T-max of 102.6 F.  Urine culture pending.  Urinalysis showed small leukocytes with 6-10 white cells..  No leukocytosis.  MRSA by PCR was negative.  Strep urinary antigen was negative.  Lactate was 1.8.Blood culture x2 still pending.  Continue vanco iv, cefepime iv pharmacy to dose  Mild hypokalemia.  Will replace.  Check levels in a.m.  Paroxysmal atrial fibrillation, coronary artery disease, chronic systolic congestive heart failure,  (EF 30-35%),  moderate MR Continue telemetry monitor.  Continue Lasix, Lipitor amiodarone Coreg and Eliquis.  Check 2D  echocardiogram.  OSA on CPAP, continue at nighttime  Stage 3 CKD Check cmp in am, last creatinine of 1.4.  Diabetes mellitus type II.  Diet controlled. Check hga1c, chronic insulin insulin, fsbs ac and qhs, ISS  GERD Cont PPI   VTE Prophylaxis: Eliquis  Code Status: Full code  Family Communication: None  Disposition Plan: Home, continue to monitor fever trend.  Follow cultures.   Consultants:  None  Procedures:  None  Antibiotics: Anti-infectives (From admission, onward)   Start     Dose/Rate Route Frequency Ordered Stop   04/07/19 2100  vancomycin (VANCOCIN) 1,250 mg in sodium chloride 0.9 % 250 mL IVPB     1,250 mg 166.7 mL/hr over 90 Minutes Intravenous Every 24 hours 04/06/19 2020     04/07/19 0100  ceFEPIme (MAXIPIME) 2 g in sodium chloride 0.9 % 100 mL IVPB     2 g 200 mL/hr over 30 Minutes Intravenous Every 8 hours 04/06/19 2020     04/06/19 2100  vancomycin (VANCOCIN) 2,000 mg in sodium chloride 0.9 % 500 mL IVPB     2,000 mg 250 mL/hr over 120 Minutes Intravenous  Once 04/06/19 2020 04/07/19 0436   04/06/19 1845  cefTRIAXone (ROCEPHIN) 1 g in sodium chloride 0.9 % 100 mL IVPB     1 g 200 mL/hr over 30 Minutes Intravenous  Once 04/06/19 1837 04/06/19 2027   04/06/19 1845  azithromycin (ZITHROMAX) 500 mg in sodium chloride 0.9 % 250 mL IVPB     500 mg 250 mL/hr over 60  Minutes Intravenous  Once 04/06/19 1837 04/07/19 0003      Objective: Vitals:   04/07/19 0300 04/07/19 0701  BP: (!) 103/44 (!) 128/43  Pulse: (!) 59 70  Resp:  20  Temp:  99.5 F (37.5 C)  SpO2:  99%    Intake/Output Summary (Last 24 hours) at 04/07/2019 1032 Last data filed at 04/07/2019 0751 Gross per 24 hour  Intake 1480.09 ml  Output 800 ml  Net 680.09 ml   Filed Weights   04/06/19 2212 04/07/19 0300  Weight: 118.3 kg 119.2 kg   Body mass index is 42.4 kg/m.   Physical Exam: GENERAL: Patient is alert awake and oriented. Not in obvious distress.  Obese, on  nasal CPAP. HENT: No scleral pallor or icterus. Pupils equally reactive to light. Oral mucosa is moist NECK: is supple, no palpable thyroid enlargement. CHEST: Diminished breath sounds bilaterally. CVS: S1 and S2 heard, no murmur. Regular rate and rhythm. No pericardial rub. ABDOMEN: Soft, non-tender, bowel sounds are present. No palpable hepato-splenomegaly. EXTREMITIES: No edema. CNS: Cranial nerves are intact. No focal motor or sensory deficits. SKIN: warm and dry without rashes.  Data Review: I have personally reviewed the following laboratory data and studies,  CBC: Recent Labs  Lab 04/06/19 1659 04/06/19 1734 04/07/19 0411  WBC 11.8*  --  9.1  NEUTROABS 9.5*  --   --   HGB 10.9* 12.6* 10.4*  HCT 35.1* 37.0* 33.0*  MCV 91.2  --  91.9  PLT 204  --  299   Basic Metabolic Panel: Recent Labs  Lab 04/06/19 1659 04/06/19 1734 04/07/19 0411  NA 137 139 137  K 3.8 4.0 3.2*  CL 103  --  104  CO2 23  --  22  GLUCOSE 122*  --  105*  BUN 13  --  15  CREATININE 1.40*  --  1.42*  CALCIUM 9.4  --  8.9   Liver Function Tests: Recent Labs  Lab 04/06/19 1659 04/07/19 0411  AST 31 31  ALT 23 27  ALKPHOS 96 79  BILITOT 1.4* 1.1  PROT 6.9 6.3*  ALBUMIN 3.5 3.1*   No results for input(s): LIPASE, AMYLASE in the last 168 hours. No results for input(s): AMMONIA in the last 168 hours. Cardiac Enzymes: No results for input(s): CKTOTAL, CKMB, CKMBINDEX, TROPONINI in the last 168 hours. BNP (last 3 results) Recent Labs    05/15/18 0950 04/06/19 1659  BNP 679.6* 910.1*    ProBNP (last 3 results) No results for input(s): PROBNP in the last 8760 hours.  CBG: Recent Labs  Lab 04/06/19 2226 04/07/19 0658  GLUCAP 92 98   Recent Results (from the past 240 hour(s))  SARS Coronavirus 2 (CEPHEID- Performed in [place] hospital lab), Hosp Order     Status: None   Collection Time: 04/06/19  5:12 PM   Specimen: Nasopharyngeal Swab  Result Value Ref Range Status   SARS  Coronavirus 2 NEGATIVE NEGATIVE Final    Comment: (NOTE) If result is NEGATIVE SARS-CoV-2 target nucleic acids are NOT DETECTED. The SARS-CoV-2 RNA is generally detectable in upper and lower  respiratory specimens during the acute phase of infection. The lowest  concentration of SARS-CoV-2 viral copies this assay can detect is 250  copies / mL. A negative result does not preclude SARS-CoV-2 infection  and should not be used as the sole basis for treatment or other  patient management decisions.  A negative result may occur with  improper specimen collection / handling, submission  of specimen other  than nasopharyngeal swab, presence of viral mutation(s) within the  areas targeted by this assay, and inadequate number of viral copies  (<250 copies / mL). A negative result must be combined with clinical  observations, patient history, and epidemiological information. If result is POSITIVE SARS-CoV-2 target nucleic acids are DETECTED. The SARS-CoV-2 RNA is generally detectable in upper and lower  respiratory specimens dur ing the acute phase of infection.  Positive  results are indicative of active infection with SARS-CoV-2.  Clinical  correlation with patient history and other diagnostic information is  necessary to determine patient infection status.  Positive results do  not rule out bacterial infection or co-infection with other viruses. If result is PRESUMPTIVE POSTIVE SARS-CoV-2 nucleic acids MAY BE PRESENT.   A presumptive positive result was obtained on the submitted specimen  and confirmed on repeat testing.  While 2019 novel coronavirus  (SARS-CoV-2) nucleic acids may be present in the submitted sample  additional confirmatory testing may be necessary for epidemiological  and / or clinical management purposes  to differentiate between  SARS-CoV-2 and other Sarbecovirus currently known to infect humans.  If clinically indicated additional testing with an alternate test   methodology [phone]) is advised. The SARS-CoV-2 RNA is generally  detectable in upper and lower respiratory sp ecimens during the acute  phase of infection. The expected result is Negative. Fact Sheet for Patients:  StrictlyIdeas.no Fact Sheet for Healthcare Providers: BankingDealers.co.za This test is not yet approved or cleared by the [place] FDA and has been authorized for detection and/or diagnosis of SARS-CoV-2 by FDA under an Emergency Use Authorization (EUA).  This EUA will remain in effect (meaning this test can be used) for the duration of the COVID-19 declaration under Section 564(b)(1) of the Act, 21 U.S.C. section 360bbb-3(b)(1), unless the authorization is terminated or revoked sooner. Performed at [facility], [place] [address], [place], [place] [address]   MRSA PCR Screening     Status: None   Collection Time: 04/07/19  2:35 AM   Specimen: Nasal Mucosa; Nasopharyngeal  Result Value Ref Range Status   MRSA by PCR NEGATIVE NEGATIVE Final    Comment:        The GeneXpert MRSA Assay (FDA approved for NASAL specimens only), is one component of a comprehensive MRSA colonization surveillance program. It is not intended to diagnose MRSA infection nor to guide or monitor treatment for MRSA infections. Performed at [facility], [place] [address], [place], [place] [address]      Studies: Dg Chest Port 1 View  Result Date: 04/06/2019 CLINICAL DATA:  Weakness and fever. EXAM: PORTABLE CHEST 1 VIEW COMPARISON:  May 15, 2018 FINDINGS: Single lead cardiac pacemaker is stable. Cardiomediastinal silhouette is enlarged. Mediastinal contours appear intact. There is no evidence of focal airspace consolidation, pleural effusion or pneumothorax. Mild increase of the interstitial markings. Osseous structures are without acute abnormality. Soft tissues are grossly normal. IMPRESSION: 1. Enlarged cardiac silhouette. 2. Mild  increase of the interstitial markings which may be seen with mild interstitial pulmonary edema or atypical pneumonia. Electronically Signed   By: Fidela Salisbury M.D.   On: 04/06/2019 17:33    Scheduled Meds: . allopurinol  300 mg Oral Daily  . amiodarone  200 mg Oral Daily  . apixaban  5 mg Oral BID  . atorvastatin  40 mg Oral q1800  . carvedilol  25 mg Oral BID WC  . cholecalciferol  1,000 Units Oral Daily  .  furosemide  60 mg Oral BID  . insulin aspart  0-5 Units Subcutaneous QHS  . insulin aspart  0-9 Units Subcutaneous TID WC  . magnesium oxide  200 mg Oral BID  . pantoprazole  40 mg Oral Daily  . potassium chloride  40 mEq Oral Daily  . sodium chloride flush  3 mL Intravenous Q12H    Continuous Infusions: . sodium chloride Stopped (04/07/19 0140)  . ceFEPime (MAXIPIME) IV 2 g (04/07/19 0739)  . vancomycin       Flora Lipps, MD  [facility] 04/07/2019

## 2019-04-07 NOTE — Progress Notes (Signed)
I responded to a [place] to provide Advance Directive information for the patient. I visited the patient's room and provided an overview of the AD and answered his questions. I left a copy of the AD with patient to complete at another time. I shared that the Chaplain is available to provide additional support as needed or requested.    04/07/19 1000  Clinical Encounter Type  Visited With Patient  Visit Type Spiritual support  Referral From Nurse  Consult/Referral To Chaplain  Spiritual Encounters  Spiritual Needs Prayer;Literature    Chaplain Dr Redgie Grayer

## 2019-04-07 NOTE — Discharge Instructions (Signed)

## 2019-04-08 LAB — BASIC METABOLIC PANEL
Anion gap: 10 (ref 5–15)
BUN: 17 mg/dL (ref 8–23)
CO2: 20 mmol/L — ABNORMAL LOW (ref 22–32)
Calcium: 9.3 mg/dL (ref 8.9–10.3)
Chloride: 108 mmol/L (ref 98–111)
Creatinine, Ser: 1.34 mg/dL — ABNORMAL HIGH (ref 0.61–1.24)
GFR calc Af Amer: >60 mL/min (ref >60)
GFR calc non Af Amer: 54 mL/min — ABNORMAL LOW (ref >60)
Glucose, Bld: 106 mg/dL — ABNORMAL HIGH (ref 70–99)
Potassium: 3.7 mmol/L (ref 3.5–5.1)
Sodium: 138 mmol/L (ref 135–145)

## 2019-04-08 LAB — LEGIONELLA PNEUMOPHILA SEROGP 1 UR AG: L. pneumophila Serogp 1 Ur Ag: NEGATIVE

## 2019-04-08 LAB — CBC
HCT: 30.5 % — ABNORMAL LOW (ref 39.0–52.0)
Hemoglobin: 9.8 g/dL — ABNORMAL LOW (ref 13.0–17.0)
MCH: 28.9 pg (ref 26.0–34.0)
MCHC: 32.1 g/dL (ref 30.0–36.0)
MCV: 90 fL (ref 80.0–100.0)
Platelets: 157 10*3/uL (ref 150–400)
RBC: 3.39 MIL/uL — ABNORMAL LOW (ref 4.22–5.81)
RDW: 15.3 % (ref 11.5–15.5)
WBC: 10 10*3/uL (ref 4.0–10.5)
nRBC: 0 % (ref 0.0–0.2)

## 2019-04-08 LAB — GLUCOSE, CAPILLARY
Glucose-Capillary: 100 mg/dL — ABNORMAL HIGH (ref 70–99)
Glucose-Capillary: 114 mg/dL — ABNORMAL HIGH (ref 70–99)
Glucose-Capillary: 96 mg/dL (ref 70–99)
Glucose-Capillary: 94 mg/dL (ref 70–99)

## 2019-04-08 LAB — PROCALCITONIN: Procalcitonin: 1.14 ng/mL

## 2019-04-08 LAB — MAGNESIUM: Magnesium: 1.6 mg/dL — ABNORMAL LOW (ref 1.7–2.4)

## 2019-04-08 NOTE — Progress Notes (Signed)
PROGRESS NOTE  Dean Bones Sr. WUJ:[id number] DOB: [birth year] DOA: 04/06/2019 PCP: [name], MD   LOS: 2 days   Brief narrative:  Dean [name]  is a 68 y.o. 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  Pt denied pnd, wt gain, lower ext edema. In ED, T 102.6  P 95  R 32  Bp 137/49  Pox 97%. Pox 88% on RA.  WBC was 11.8.  BNP at 910.  COVID-19 was negative.  Patient was noted to have multifocal pneumonia and was considered for admission to the hospital for acute respiratory failure with hypoxia.  Subjective: Patient feels better with breathing today.  Denies any chest pain, palpitation.  Has mild cough without sputum production.  Significant leg edema still persists.  Assessment/Plan:  Principal Problem:   Acute respiratory failure with hypoxia (HCC) Active Problems:   DM2 (diabetes mellitus, type 2) (HCC)   Essential hypertension, benign   Fever   Stage 3 chronic kidney disease (HCC)   CAD (coronary artery disease)   Chronic combined systolic and diastolic CHF (congestive heart failure) ([place])   HCAP (healthcare-associated pneumonia)  Acute Respiratory failure w Hypoxia likely secondary to multifocal pneumonia. Temperature max of 99 F over the 24 hours.  On presentation it was 102.6.[name]  Urine culture with no growth till date.  No leukocytosis.  MRSA by PCR was negative, will discontinue vancomycin today..  Strep urinary antigen was negative.  Lactate was 1.8. Blood culture x2 negative so far..  Continue  cefepime for now.  Check procalcitonin.  Mild hypokalemia.  Improved after replacement.  Paroxysmal atrial fibrillation, coronary artery disease, chronic systolic congestive heart failure with peripheral edema,  (EF 30-35%),   moderate MR   Continue Lasix, Lipitor, amiodarone, Coreg and Eliquis.   2D echocardiogram from 04/07/2019 showed left-ventricular ejection fraction of 30 to 35% with impaired relaxation left ventricle..  OSA on CPAP, continue at nighttime  Stage 3 CKD Check BMP in am, latest creatinine of 1.3.  Diabetes mellitus type II.  Diet controlled. Sliding scale insulin Accu-Cheks.  GERD Cont PPI   VTE Prophylaxis: Eliquis  Code Status: Full code  Family Communication: None  Disposition Plan: Home likely by tomorrow if he continues to improve clinically..  Will discontinue vancomycin today.  Ambulate the patient.   Consultants:  None  Procedures:  None  Antibiotics: Anti-infectives (From admission, onward)   Start     Dose/Rate Route Frequency Ordered Stop   04/07/19 2100  vancomycin (VANCOCIN) 1,250 mg in sodium chloride 0.9 % 250 mL IVPB  Status:  Discontinued     1,250 mg 166.7 mL/hr over 90 Minutes Intravenous Every 24 hours 04/06/19 2020 04/08/19 1336   04/07/19 0100  ceFEPIme (MAXIPIME) 2 g in sodium chloride 0.9 % 100 mL IVPB     2 g 200 mL/hr over 30 Minutes Intravenous Every 8 hours 04/06/19 2020     04/06/19 2100  vancomycin (VANCOCIN) 2,000 mg in sodium chloride 0.9 % 500 mL IVPB     2,000 mg 250 mL/hr over 120 Minutes Intravenous  Once 04/06/19 2020 04/07/19 0436   04/06/19 1845  cefTRIAXone (ROCEPHIN) 1 g in sodium chloride 0.9 % 100 mL IVPB     1 g 200 mL/hr over 30  Minutes Intravenous  Once 04/06/19 1837 04/06/19 2027   04/06/19 1845  azithromycin (ZITHROMAX) 500 mg in sodium chloride 0.9 % 250 mL IVPB     500 mg 250 mL/hr over 60 Minutes Intravenous  Once 04/06/19 1837 04/07/19 0003      Objective: Vitals:   04/08/19 0046 04/08/19 0452  BP: (!) 126/46 (!) 139/58  Pulse: 68 66  Resp: 18 17  Temp: 98.2 F (36.8 C) 99 F (37.2 C)  SpO2: 100% 98%    Intake/Output Summary (Last 24 hours) at 04/08/2019 1438 Last data filed at 04/08/2019 0854 Gross  per 24 hour  Intake 340 ml  Output 700 ml  Net -360 ml   Filed Weights   04/06/19 2212 04/07/19 0300 04/08/19 0454  Weight: 118.3 kg 119.2 kg 119.7 kg   Body mass index is 42.59 kg/m.   Physical Exam: GENERAL: Patient is alert awake and oriented. Not in obvious distress.  Morbidly obese HENT: No scleral pallor or icterus. Pupils equally reactive to light. Oral mucosa is moist NECK: is supple, no palpable thyroid enlargement. CHEST: Diminished breath sounds bilaterally.  No definite wheezes noted CVS: S1 and S2 heard, no murmur. Regular rate and rhythm. No pericardial rub. ABDOMEN: Soft, non-tender, bowel sounds are present. No palpable hepato-splenomegaly. EXTREMITIES: No edema. CNS: Cranial nerves are intact. No focal motor or sensory deficits. SKIN: warm and dry without rashes.  Data Review: I have personally reviewed the following laboratory data and studies,  CBC: Recent Labs  Lab 04/06/19 1659 04/06/19 1734 04/07/19 0411 04/08/19 0750  WBC 11.8*  --  9.1 10.0  NEUTROABS 9.5*  --   --   --   HGB 10.9* 12.6* 10.4* 9.8*  HCT 35.1* 37.0* 33.0* 30.5*  MCV 91.2  --  91.9 90.0  PLT 204  --  158 614   Basic Metabolic Panel: Recent Labs  Lab 04/06/19 1659 04/06/19 1734 04/07/19 0411 04/08/19 0750  NA 137 139 137 138  K 3.8 4.0 3.2* 3.7  CL 103  --  104 108  CO2 23  --  22 20*  GLUCOSE 122*  --  105* 106*  BUN 13  --  15 17  CREATININE 1.40*  --  1.42* 1.34*  CALCIUM 9.4  --  8.9 9.3  MG  --   --   --  1.6*   Liver Function Tests: Recent Labs  Lab 04/06/19 1659 04/07/19 0411  AST 31 31  ALT 23 27  ALKPHOS 96 79  BILITOT 1.4* 1.1  PROT 6.9 6.3*  ALBUMIN 3.5 3.1*   No results for input(s): LIPASE, AMYLASE in the last 168 hours. No results for input(s): AMMONIA in the last 168 hours. Cardiac Enzymes: No results for input(s): CKTOTAL, CKMB, CKMBINDEX, TROPONINI in the last 168 hours. BNP (last 3 results) Recent Labs    05/15/18 0950 04/06/19 1659   BNP 679.6* 910.1*    ProBNP (last 3 results) No results for input(s): PROBNP in the last 8760 hours.  CBG: Recent Labs  Lab 04/07/19 1130 04/07/19 1635 04/07/19 2114 04/08/19 0615 04/08/19 1114  GLUCAP 111* 96 100* 100* 114*   Recent Results (from the past 240 hour(s))  Blood Culture (routine x 2)     Status: None (Preliminary result)   Collection Time: 04/06/19  4:59 PM   Specimen: BLOOD  Result Value Ref Range Status   Specimen Description BLOOD SITE NOT SPECIFIED  Final   Special Requests   Final    BOTTLES DRAWN  AEROBIC AND ANAEROBIC Blood Culture results may not be optimal due to an inadequate volume of blood received in culture bottles   Culture   Final    NO GROWTH 2 DAYS Performed at [facility], [place] [address] [place], [place] [address]    Report Status PENDING  Incomplete  SARS Coronavirus 2 (CEPHEID- Performed in [place] hospital lab), Hosp Order     Status: None   Collection Time: 04/06/19  5:12 PM   Specimen: Nasopharyngeal Swab  Result Value Ref Range Status   SARS Coronavirus 2 NEGATIVE NEGATIVE Final    Comment: (NOTE) If result is NEGATIVE SARS-CoV-2 target nucleic acids are NOT DETECTED. The SARS-CoV-2 RNA is generally detectable in upper and lower  respiratory specimens during the acute phase of infection. The lowest  concentration of SARS-CoV-2 viral copies this assay can detect is 250  copies / mL. A negative result does not preclude SARS-CoV-2 infection  and should not be used as the sole basis for treatment or other  patient management decisions.  A negative result may occur with  improper specimen collection / handling, submission of specimen other  than nasopharyngeal swab, presence of viral mutation(s) within the  areas targeted by this assay, and inadequate number of viral copies  (<250 copies / mL). A negative result must be combined with clinical  observations, patient history, and epidemiological information. If result is  POSITIVE SARS-CoV-2 target nucleic acids are DETECTED. The SARS-CoV-2 RNA is generally detectable in upper and lower  respiratory specimens dur ing the acute phase of infection.  Positive  results are indicative of active infection with SARS-CoV-2.  Clinical  correlation with patient history and other diagnostic information is  necessary to determine patient infection status.  Positive results do  not rule out bacterial infection or co-infection with other viruses. If result is PRESUMPTIVE POSTIVE SARS-CoV-2 nucleic acids MAY BE PRESENT.   A presumptive positive result was obtained on the submitted specimen  and confirmed on repeat testing.  While 2019 novel coronavirus  (SARS-CoV-2) nucleic acids may be present in the submitted sample  additional confirmatory testing may be necessary for epidemiological  and / or clinical management purposes  to differentiate between  SARS-CoV-2 and other Sarbecovirus currently known to infect humans.  If clinically indicated additional testing with an alternate test  methodology [phone]) is advised. The SARS-CoV-2 RNA is generally  detectable in upper and lower respiratory sp ecimens during the acute  phase of infection. The expected result is Negative. Fact Sheet for Patients:  StrictlyIdeas.no Fact Sheet for Healthcare Providers: BankingDealers.co.za This test is not yet approved or cleared by the [place] FDA and has been authorized for detection and/or diagnosis of SARS-CoV-2 by FDA under an Emergency Use Authorization (EUA).  This EUA will remain in effect (meaning this test can be used) for the duration of the COVID-19 declaration under Section 564(b)(1) of the Act, 21 U.S.C. section 360bbb-3(b)(1), unless the authorization is terminated or revoked sooner. Performed at [facility], [place] [address], [place], [place] [address]   Blood Culture (routine x 2)     Status: None  (Preliminary result)   Collection Time: 04/06/19  6:23 PM   Specimen: BLOOD  Result Value Ref Range Status   Specimen Description BLOOD RIGHT ANTECUBITAL  Final   Special Requests   Final    BOTTLES DRAWN AEROBIC AND ANAEROBIC Blood Culture results may not be optimal due to an excessive volume of blood received in culture  bottles   Culture   Final    NO GROWTH 2 DAYS Performed at [facility], [place] [address] [place], [place] [address]    Report Status PENDING  Incomplete  Urine culture     Status: None   Collection Time: 04/06/19  7:28 PM   Specimen: In/Out Cath Urine  Result Value Ref Range Status   Specimen Description IN/OUT CATH URINE  Final   Special Requests Normal  Final   Culture   Final    NO GROWTH Performed at [facility], [place] [address] [place], [place] [address]    Report Status 04/07/2019 FINAL  Final  MRSA PCR Screening     Status: None   Collection Time: 04/07/19  2:35 AM   Specimen: Nasal Mucosa; Nasopharyngeal  Result Value Ref Range Status   MRSA by PCR NEGATIVE NEGATIVE Final    Comment:        The GeneXpert MRSA Assay (FDA approved for NASAL specimens only), is one component of a comprehensive MRSA colonization surveillance program. It is not intended to diagnose MRSA infection nor to guide or monitor treatment for MRSA infections. Performed at [facility], [place] [address] [place], [place] [address]      Studies: Dg Chest Port 1 View  Result Date: 04/06/2019 CLINICAL DATA:  Weakness and fever. EXAM: PORTABLE CHEST 1 VIEW COMPARISON:  May 15, 2018 FINDINGS: Single lead cardiac pacemaker is stable. Cardiomediastinal silhouette is enlarged. Mediastinal contours appear intact. There is no evidence of focal airspace consolidation, pleural effusion or pneumothorax. Mild increase of the interstitial markings. Osseous structures are without acute abnormality. Soft tissues are grossly normal. IMPRESSION: 1. Enlarged cardiac silhouette.  2. Mild increase of the interstitial markings which may be seen with mild interstitial pulmonary edema or atypical pneumonia. Electronically Signed   By: Fidela Salisbury M.D.   On: 04/06/2019 17:33    Scheduled Meds: . allopurinol  300 mg Oral Daily  . amiodarone  200 mg Oral Daily  . apixaban  5 mg Oral BID  . atorvastatin  40 mg Oral q1800  . carvedilol  25 mg Oral BID WC  . cholecalciferol  1,000 Units Oral Daily  . furosemide  60 mg Oral BID  . insulin aspart  0-5 Units Subcutaneous QHS  . insulin aspart  0-9 Units Subcutaneous TID WC  . magnesium oxide  200 mg Oral BID  . pantoprazole  40 mg Oral Daily  . potassium chloride  40 mEq Oral Daily  . sodium chloride flush  3 mL Intravenous Q12H    Continuous Infusions: . sodium chloride Stopped (04/07/19 0140)  . ceFEPime (MAXIPIME) IV 2 g (04/08/19 0165)     Flora Lipps, MD  [facility] 04/08/2019

## 2019-04-08 NOTE — Progress Notes (Signed)
Spoke with patient about wearing CPAP tonight.  Patient stated he did not feel well last night so he did not wear it.  I put water in machine for patient.  He stated that he could put it on and operate it himself.  He also stated that the pressure seemed too high so I took it down to 18 to see if that would be okay for patient.  I also informed him that we are here all night if he needs [place] further.

## 2019-04-09 ENCOUNTER — Telehealth: Payer: Self-pay | Admitting: Cardiology

## 2019-04-09 ENCOUNTER — Telehealth: Payer: Self-pay | Admitting: *Deleted

## 2019-04-09 DIAGNOSIS — I255 Ischemic cardiomyopathy: Secondary | ICD-10-CM

## 2019-04-09 DIAGNOSIS — I5022 Chronic systolic (congestive) heart failure: Secondary | ICD-10-CM

## 2019-04-09 LAB — BASIC METABOLIC PANEL
Anion gap: 7 (ref 5–15)
BUN: 20 mg/dL (ref 8–23)
CO2: 24 mmol/L (ref 22–32)
Calcium: 9.3 mg/dL (ref 8.9–10.3)
Chloride: 108 mmol/L (ref 98–111)
Creatinine, Ser: 1.39 mg/dL — ABNORMAL HIGH (ref 0.61–1.24)
GFR calc Af Amer: 60 mL/min — ABNORMAL LOW (ref >60)
GFR calc non Af Amer: 51 mL/min — ABNORMAL LOW (ref >60)
Glucose, Bld: 109 mg/dL — ABNORMAL HIGH (ref 70–99)
Potassium: 3.6 mmol/L (ref 3.5–5.1)
Sodium: 139 mmol/L (ref 135–145)

## 2019-04-09 LAB — CBC
HCT: 29.5 % — ABNORMAL LOW (ref 39.0–52.0)
Hemoglobin: 9.3 g/dL — ABNORMAL LOW (ref 13.0–17.0)
MCH: 28.9 pg (ref 26.0–34.0)
MCHC: 31.5 g/dL (ref 30.0–36.0)
MCV: 91.6 fL (ref 80.0–100.0)
Platelets: 159 10*3/uL (ref 150–400)
RBC: 3.22 MIL/uL — ABNORMAL LOW (ref 4.22–5.81)
RDW: 15.3 % (ref 11.5–15.5)
WBC: 9.9 10*3/uL (ref 4.0–10.5)
nRBC: 0 % (ref 0.0–0.2)

## 2019-04-09 LAB — MAGNESIUM: Magnesium: 1.8 mg/dL (ref 1.7–2.4)

## 2019-04-09 LAB — GLUCOSE, CAPILLARY: Glucose-Capillary: 98 mg/dL (ref 70–99)

## 2019-04-09 MED ORDER — AMOXICILLIN-POT CLAVULANATE 875-125 MG PO TABS: 1.0000 | ORAL_TABLET | Freq: Two times a day (BID) | ORAL | 0 refills | Status: AC

## 2019-04-09 NOTE — Discharge Summary (Signed)
Physician Discharge Summary  Dean Bones Sr. OZY:[id number] DOB: 01/08/1951 DOA: 04/06/2019  PCP: [name], MD  Admit date: 04/06/2019 Discharge date: 04/09/2019  Admitted From: Home  Discharge disposition: Home   Recommendations for Outpatient Follow-Up:   Follow up with your primary care provider and cardiology after discharge.    Discharge Diagnosis:   Principal Problem:   Acute respiratory failure with hypoxia (HCC) Active Problems:   DM2 (diabetes mellitus, type 2) (HCC)   Essential hypertension, benign   Fever   Stage 3 chronic kidney disease (HCC)   CAD (coronary artery disease)   Chronic combined systolic and diastolic CHF (congestive heart failure) ([place])   HCAP (healthcare-associated pneumonia)   Discharge Condition: Improved.  Diet recommendation: Low sodium, heart healthy.  Carbohydrate-modified.   Wound care: None.  Code status: Full.   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 Pt denied pnd, wt gain, lower ext edema. In ED, T 102.6 P 95 R 32 Bp 137/49 Pox 97%. Pox 88% on RA.  WBC was 11.8.  BNP at 910.  COVID-19 was negative.  Patient was noted to have multifocal pneumonia and was considered for admission to the hospital for acute respiratory failure with hypoxia.  Hospital Course:   Following conditions were addressed during hospitalization,  Acute Respiratory failure w Hypoxia likely secondary to multifocal pneumonia.  Resolved. Temperature max of 98 F over the 24 hours.  On presentation it was 102.6.[name]  Urine culture with no growth till date.  No leukocytosis.  MRSA by PCR was negative,  Strep urinary antigen and Legionella was negative.   Lactate was 1.8. Blood culture x2 negative so far.    Patient received 3 days of cefepime while in the hospital and will be changed to oral Augmentin on discharge to complete 5-day course of antibiotic.  Procalcitonin was mildly elevated.    Clinically patient has significantly improved.  Mild hypokalemia.  Improved after replacement.  Paroxysmal atrial fibrillation, coronary artery disease, chronic systolic congestive heart failure with peripheral edema,  (EF 30-35%), moderate MR   Continue Lasix, Lipitor, amiodarone, Coreg and Eliquis.   2D echocardiogram from 04/07/2019 showed left-ventricular ejection fraction of 30 to 35% with impaired relaxation left ventricle.  Patient will follow-up with his primary cardiologist after discharge he already has an appointment for that.  OSA on CPAP, continue at nighttime  Stage 3 CKD  Check BMP in am, latest creatinine of 1.3.  At baseline  Diabetes mellitus type II.  Diet controlled. Sliding scale insulin Accu-Cheks hospitalization.  Diabetic diet.  Disposition.  At this time, patient is a stable for disposition home.  Follow-up with his primary cardiologist and PCP after discharge.    Medical Consultants:    None.   Subjective:   Today, patient feels okay.  Not needing oxygen.  Denies any chest pain, shortness of breath, fever or chills.  Discharge Exam:   Vitals:   04/08/19 2008 04/09/19 0500  BP: (!) 157/74 (!) 140/51  Pulse: (!) 55 (!) 55  Resp: 18 16  Temp: 98.1 F (36.7 C) 98.1 F (36.7 C)  SpO2:  98%   Vitals:   04/08/19 0454 04/08/19 1703 04/08/19 2008 04/09/19 0500  BP:  (!) 143/53 [name])  157/74 (!) 140/51  Pulse:  60 (!) 55 (!) 55  Resp:  18 18 16   Temp:  97.8 F (36.6 C) 98.1 F (36.7 C) 98.1 F (36.7 C)  TempSrc:  Oral Oral Oral  SpO2:  100%  98%  Weight: 119.7 kg   119.7 kg  Height:        General exam: Appears calm and comfortable ,Not in distress.  Morbidly obese HEENT:PERRL,Oral mucosa moist  Respiratory system: Bilateral equal air entry, normal vesicular breath sounds, no wheezes or crackles  Cardiovascular system: S1 & S2 heard, RRR.  Gastrointestinal system: Abdomen is nondistended, soft and nontender. No organomegaly or masses felt. Normal bowel sounds heard. Central nervous system: Alert and oriented. No focal neurological deficits. Extremities: No edema, no clubbing ,no cyanosis, distal peripheral pulses palpable. Skin: No rashes, lesions or ulcers,no icterus ,no pallor MSK: Normal muscle bulk,tone ,power    Procedures:    None  The results of significant diagnostics from this hospitalization (including imaging, microbiology, ancillary and laboratory) are listed below for reference.     Diagnostic Studies:   Dg Chest Port 1 View  Result Date: 04/06/2019 CLINICAL DATA:  Weakness and fever. EXAM: PORTABLE CHEST 1 VIEW COMPARISON:  May 15, 2018 FINDINGS: Single lead cardiac pacemaker is stable. Cardiomediastinal silhouette is enlarged. Mediastinal contours appear intact. There is no evidence of focal airspace consolidation, pleural effusion or pneumothorax. Mild increase of the interstitial markings. Osseous structures are without acute abnormality. Soft tissues are grossly normal. IMPRESSION: 1. Enlarged cardiac silhouette. 2. Mild increase of the interstitial markings which may be seen with mild interstitial pulmonary edema or atypical pneumonia. Electronically Signed   By: Fidela Salisbury M.D.   On: 04/06/2019 17:33     Labs:   Basic Metabolic Panel: Recent Labs  Lab 04/06/19 1659 04/06/19 1734 04/07/19 0411 04/08/19 0750 04/09/19 0420  NA 137 139 137 138 139  K 3.8 4.0 3.2* 3.7 3.6  CL 103  --  104 108 108  CO2 23  --  22 20* 24  GLUCOSE 122*  --  105* 106* 109*  BUN 13  --  15 17 20   CREATININE 1.40*  --  1.42* 1.34* 1.39*  CALCIUM 9.4  --  8.9 9.3 9.3  MG  --   --   --  1.6* 1.8   GFR Estimated Creatinine Clearance: 61.2 mL/min (A) (by C-G  formula based on SCr of 1.39 mg/dL (H)). Liver Function Tests: Recent Labs  Lab 04/06/19 1659 04/07/19 0411  AST 31 31  ALT 23 27  ALKPHOS 96 79  BILITOT 1.4* 1.1  PROT 6.9 6.3*  ALBUMIN 3.5 3.1*   No results for input(s): LIPASE, AMYLASE in the last 168 hours. No results for input(s): AMMONIA in the last 168 hours. Coagulation profile Recent Labs  Lab 04/06/19 1659  INR 1.6*    CBC: Recent Labs  Lab 04/06/19 1659 04/06/19 1734 04/07/19 0411 04/08/19 0750 04/09/19 0420  WBC 11.8*  --  9.1 10.0 9.9  NEUTROABS 9.5*  --   --   --   --   HGB 10.9* 12.6* 10.4* 9.8* 9.3*  HCT 35.1* 37.0* 33.0* 30.5* 29.5*  MCV 91.2  --  91.9 90.0 91.6  PLT 204  --  158 157 159   Cardiac Enzymes: No results for input(s): CKTOTAL, CKMB, CKMBINDEX, TROPONINI in the last 168 hours. BNP: Invalid input(s): POCBNP CBG: Recent Labs  Lab 04/08/19 0615 04/08/19 1114 04/08/19 1701 04/08/19 2106 04/09/19 5631  GLUCAP 100* 114* 96 94 98   D-Dimer No results for input(s): DDIMER in the last 72 hours. Hgb A1c No results for input(s): HGBA1C in the last 72 hours. Lipid Profile No results for input(s): CHOL, HDL, LDLCALC, TRIG, CHOLHDL, LDLDIRECT in the last 72 hours. Thyroid function studies No results for input(s): TSH, T4TOTAL, T3FREE, THYROIDAB in the last 72 hours.  Invalid input(s): FREET3 Anemia work up No results for input(s): VITAMINB12, FOLATE, FERRITIN, TIBC, IRON, RETICCTPCT in the last 72 hours. Microbiology Recent Results (from the past 240 hour(s))  Blood Culture (routine x 2)     Status: None (Preliminary result)   Collection Time: 04/06/19  4:59 PM   Specimen: BLOOD  Result Value Ref Range Status   Specimen Description BLOOD SITE NOT SPECIFIED  Final   Special Requests   Final    BOTTLES DRAWN AEROBIC AND ANAEROBIC Blood Culture results may not be optimal due to an inadequate volume of blood received in culture bottles   Culture   Final    NO GROWTH 2 DAYS Performed  at [facility], [place] [address], [place], [place] [address]    Report Status PENDING  Incomplete  SARS Coronavirus 2 (CEPHEID- Performed in [place] hospital lab), Hosp Order     Status: None   Collection Time: 04/06/19  5:12 PM   Specimen: Nasopharyngeal Swab  Result Value Ref Range Status   SARS Coronavirus 2 NEGATIVE NEGATIVE Final    Comment: (NOTE) If result is NEGATIVE SARS-CoV-2 target nucleic acids are NOT DETECTED. The SARS-CoV-2 RNA is generally detectable in upper and lower  respiratory specimens during the acute phase of infection. The lowest  concentration of SARS-CoV-2 viral copies this assay can detect is 250  copies / mL. A negative result does not preclude SARS-CoV-2 infection  and should not be used as the sole basis for treatment or other  patient management decisions.  A negative result may occur with  improper specimen collection / handling, submission of specimen other  than nasopharyngeal swab, presence of viral mutation(s) within the  areas targeted by this assay, and inadequate number of viral copies  (<250 copies / mL). A negative result must be combined with clinical  observations, patient history, and epidemiological information. If result is POSITIVE SARS-CoV-2 target nucleic acids are DETECTED. The SARS-CoV-2 RNA is generally detectable in upper and lower  respiratory specimens dur ing the acute phase of infection.  Positive  results are indicative of active infection with SARS-CoV-2.  Clinical  correlation with patient history and other diagnostic information is  necessary to determine patient infection status.  Positive results do  not rule out bacterial infection or co-infection with other viruses. If result is PRESUMPTIVE POSTIVE SARS-CoV-2 nucleic acids MAY BE PRESENT.   A presumptive positive result was obtained on the submitted specimen  and confirmed on repeat testing.  While 2019 novel coronavirus  (SARS-CoV-2) nucleic acids may be  present in the submitted sample  additional confirmatory testing may be necessary for epidemiological  and / or clinical management purposes  to differentiate between  SARS-CoV-2 and other Sarbecovirus currently known to infect humans.  If clinically indicated additional testing with an alternate test  methodology [phone]) is advised. The SARS-CoV-2 RNA is generally  detectable in upper and lower respiratory sp ecimens during the acute  phase of infection. The expected result is Negative. Fact Sheet for Patients:  StrictlyIdeas.no Fact Sheet for Healthcare Providers: BankingDealers.co.za This test is not yet approved or cleared  by the [place] and has been authorized for detection and/or diagnosis of SARS-CoV-2 by FDA under an Emergency Use Authorization (EUA).  This EUA will remain in effect (meaning this test can be used) for the duration of the COVID-19 declaration under Section 564(b)(1) of the Act, 21 U.S.C. section 360bbb-3(b)(1), unless the authorization is terminated or revoked sooner. Performed at [facility], [place] [address] [place], [place] [address]   Blood Culture (routine x 2)     Status: None (Preliminary result)   Collection Time: 04/06/19  6:23 PM   Specimen: BLOOD  Result Value Ref Range Status   Specimen Description BLOOD RIGHT ANTECUBITAL  Final   Special Requests   Final    BOTTLES DRAWN AEROBIC AND ANAEROBIC Blood Culture results may not be optimal due to an excessive volume of blood received in culture bottles   Culture   Final    NO GROWTH 2 DAYS Performed at [facility], [place] [address] [place], [place] [address]    Report Status PENDING  Incomplete  Urine culture     Status: None   Collection Time: 04/06/19  7:28 PM   Specimen: In/Out Cath Urine  Result Value Ref Range Status   Specimen Description IN/OUT CATH URINE  Final   Special Requests Normal  Final   Culture   Final    NO  GROWTH Performed at [facility], [place] [address] [place], [place] [address]    Report Status 04/07/2019 FINAL  Final  MRSA PCR Screening     Status: None   Collection Time: 04/07/19  2:35 AM   Specimen: Nasal Mucosa; Nasopharyngeal  Result Value Ref Range Status   MRSA by PCR NEGATIVE NEGATIVE Final    Comment:        The GeneXpert MRSA Assay (FDA approved for NASAL specimens only), is one component of a comprehensive MRSA colonization surveillance program. It is not intended to diagnose MRSA infection nor to guide or monitor treatment for MRSA infections. Performed at [facility], [place] [address] [place], [place] [address]      Discharge Instructions:   Discharge Instructions    Call MD for:  difficulty breathing, headache or visual disturbances   Complete by: As directed    Call MD for:  temperature >100.4   Complete by: As directed    Diet - low sodium heart healthy   Complete by: As directed    Discharge instructions   Complete by: As directed    Follow up with your primary care physician in one week. Complete the course of antibiotics as prescribed. Follow up with your cardiologist as has been scheduled by you.   Increase activity slowly   Complete by: As directed      Allergies as of 04/09/2019      Reactions   Contrast Media [iodinated Diagnostic Agents] Shortness Of Breath, Nausea And Vomiting   Lisinopril Cough      Medication List    TAKE these medications   allopurinol 300 MG tablet Commonly known as: ZYLOPRIM Take 300 mg by mouth daily.   amiodarone 200 MG tablet Commonly known as: PACERONE Take 1 tablet (200 mg total) by mouth daily.   amoxicillin-clavulanate 875-125 MG tablet Commonly known as: Augmentin Take 1 tablet by mouth 2 (two) times daily for 3 days.   apixaban 5 MG Tabs tablet Commonly known as: ELIQUIS Take 1 tablet (5 mg total) by mouth 2 (two) times daily.  atorvastatin 40 MG tablet Commonly known as: LIPITOR  Take 1 tablet (40 mg total) by mouth daily at 6 PM.   carvedilol 25 MG tablet Commonly known as: COREG Take 1 tablet (25 mg total) by mouth 2 (two) times daily with a meal.   [employer] Vitamin D3 25 MCG (1000 UT) capsule Generic drug: Cholecalciferol Take 1,000 Units by mouth daily.   furosemide 40 MG tablet Commonly known as: LASIX Take 1.5 tablets (60 mg total) by mouth 2 (two) times daily.   magnesium oxide 400 MG tablet Commonly known as: MAG-OX Take 200 mg by mouth 2 (two) times daily.   pantoprazole 40 MG tablet Commonly known as: PROTONIX Take 1 tablet (40 mg total) by mouth daily.         Time coordinating discharge: 39 minutes  Signed:  Seldon Barrell  [facility] 04/09/2019, 9:05 AM

## 2019-04-09 NOTE — Progress Notes (Addendum)
Pharmacy Antibiotic + Anticoagulation Note  Knute Mazzuca Sr. is a 68 y.o. male admitted on 04/06/2019 with SOB. He will be started on empiric antibiotics for potential multifocal PNA. He has a history of CKD with current SCr 1.39. Of note, recent hospitalization for acute renal failure (02/27/19)  Additionally, he is on Eliquis PTA for AFib. He is on the appropriate dose given his age and weight, despite his kidney function.  Plan: - Cefepime 2 g IV q8h - Eliquis 5 mg po bid - Monitor PCT trend  - Monitor renal fx and cultures   Height: 5\' 6"  (167.6 cm) Weight: 263 lb 12.8 oz (119.7 kg)(scale c) IBW/kg (Calculated) : 63.8  Temp (24hrs), Avg:98 F (36.7 C), Min:97.8 F (36.6 C), Max:98.1 F (36.7 C)  Recent Labs  Lab 04/06/19 1653 04/06/19 1659 04/06/19 2129 04/07/19 0411 04/08/19 0750 04/09/19 0420  WBC  --  11.8*  --  9.1 10.0 9.9  CREATININE  --  1.40*  --  1.42* 1.34* 1.39*  LATICACIDVEN 1.7  --  1.8  --   --   --     Estimated Creatinine Clearance: 61.2 mL/min (A) (by C-G formula based on SCr of 1.39 mg/dL (H)).    Allergies  Allergen Reactions  . Contrast Media [Iodinated Diagnostic Agents] Shortness Of Breath and Nausea And Vomiting  . Lisinopril Cough    Antimicrobials this admission: 7/12 cefepime > 7/12 vancomycin > 7/14 7/12 azithromycin > 7/13  Microbiology results: 7/12 blood cx: ngtd 7/12 urine cx: neg 7/12 Covid-19:negative 7/13: MRSA PCR: negative Urine strep antigen: negative urine legionella antigen: negative    Lorel Monaco, PharmD PGY1 Ambulatory Care Resident [employer] # [phone]

## 2019-04-09 NOTE — Telephone Encounter (Signed)
New Message         COVID-19 Pre-Screening Questions:   In the past 7 to 10 days have you had a cough,  shortness of breath, headache, congestion, fever (100 or greater) body aches, chills, sore throat, or sudden loss of taste or sense of smell? Pt got out of hospital today from pneumonia   Have you been around anyone with known Covid 19. NO  Have you been around anyone who is awaiting Covid 19 test results in the past 7 to 10 days? NO  Have you been around anyone who has been exposed to Covid 19, or has mentioned symptoms of Covid 19 within the past 7 to 10 days? NO  If you have any concerns/questions about symptoms patients report during screening (either on the phone or at threshold). Contact the provider seeing the patient or DOD for further guidance.  If neither are available contact a member of the leadership team.

## 2019-04-09 NOTE — Telephone Encounter (Signed)
Reroute

## 2019-04-09 NOTE — Telephone Encounter (Signed)
Made pt aware of general cardiologist referral, per Dr. Curt Bears, for cardiomyopathy/heart failure. Patient verbalized understanding and agreeable. He understands office will call to arrange referral.

## 2019-04-10 NOTE — Telephone Encounter (Signed)
Called pt - pt cleared for in-office visit tomorrow.

## 2019-04-11 ENCOUNTER — Other Ambulatory Visit: Payer: Self-pay

## 2019-04-11 ENCOUNTER — Encounter: Payer: Self-pay | Admitting: Cardiology

## 2019-04-11 ENCOUNTER — Ambulatory Visit (INDEPENDENT_AMBULATORY_CARE_PROVIDER_SITE_OTHER): Payer: Medicare Other | Admitting: Cardiology

## 2019-04-11 DIAGNOSIS — I5022 Chronic systolic (congestive) heart failure: Secondary | ICD-10-CM | POA: Diagnosis not present

## 2019-04-11 LAB — CUP PACEART INCLINIC DEVICE CHECK
Battery Remaining Longevity: 122 mo
Battery Voltage: 3 V
Brady Statistic RV Percent Paced: 0.09 %
Date Time Interrogation Session: 20200717104827
HighPow Impedance: 57 Ohm
Implantable Lead Implant Date: 20180116
Implantable Lead Location: 753860
Implantable Lead Model: 6935
Implantable Pulse Generator Implant Date: 20180116
Lead Channel Impedance Value: 266 Ohm
Lead Channel Impedance Value: 342 Ohm
Lead Channel Pacing Threshold Amplitude: 0.75 V
Lead Channel Pacing Threshold Pulse Width: 0.4 ms
Lead Channel Sensing Intrinsic Amplitude: 10.625 mV
Lead Channel Sensing Intrinsic Amplitude: 10.625 mV
Lead Channel Setting Pacing Amplitude: 2.5 V
Lead Channel Setting Pacing Pulse Width: 0.4 ms
Lead Channel Setting Sensing Sensitivity: 0.3 mV

## 2019-04-11 LAB — CULTURE, BLOOD (ROUTINE X 2)
Culture: NO GROWTH
Culture: NO GROWTH

## 2019-04-11 NOTE — Patient Instructions (Addendum)
Medication Instructions:  Your physician has recommended you make the following change in your medication:  1. INCREASE Lasix to 80 mg twice daily until your weight goes back to baseline, about 250 pounds -- then return to 60 mg twice daily  * If you need a refill on your cardiac medications before your next appointment, please call your pharmacy.   Labwork: None ordered  Testing/Procedures: None ordered  Follow-Up: You have been referred to general cardiology  Your physician wants you to follow-up in: 6 months with Dr. Curt Bears.  You will receive a reminder letter in the mail two months in advance. If you don't receive a letter, please call our office to schedule the follow-up appointment.  Thank you for choosing [facility]!!   Trinidad Curet, RN [phone]  Any Other Special Instructions Will Be Listed Below (If Applicable). WEIGH YOURSELF DAILY AND KEEP A LOG

## 2019-04-11 NOTE — Progress Notes (Signed)
Electrophysiology Office Note   Date:  04/11/2019   ID:  Dean Bones Sr., DOB Feb 17, 1951, MRN [id number]  PCP:  [name], MD Primary Electrophysiologist:  [name], MD    No chief complaint on file.    History of Present Illness: Dean Deerman Sr. is a 68 y.o. male who presents today for electrophysiology evaluation.   Hx asthma, T2DM, asthma, gout, HTN who was admitted on 08/27/2016 after he collapsed walking back from the bathroom. CPR not performed by family. He was treated with CPR and ALS protocol and he coded again en route to the hospital.  He had episode of NSVT in CT scanner and treated with amiodarone. EKG with inferior T wave inversions and was treated with [employer] protocol and elevated troponin felt to be due to shock applied for VT/VF. Cardiac cath 12/14 revealed 90% ostial stenosis distal R-RCA with left to right collaterals and 100% stenosis mid RCA to distal RCA lesion. ICD implanted 10/10/16.   Today, denies symptoms of palpitations, chest pain, shortness of breath, orthopnea, PND, claudication, dizziness, presyncope, syncope, bleeding, or neurologic sequela. The patient is tolerating medications without difficulties.  He was recently hospitalized for community-acquired pneumonia.  He is currently.  He finishes this tomorrow.  He was given IV fluids and is volume overloaded by both exam and optivol.  He says that he feels swollen currently.   Past Medical History:  Diagnosis Date  . 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 Arthritis    "maybe in his spine" (09/05/2016)  . Asthma    pt. denies  . Cardiac arrest ([place]) 08/27/2016   REQUIRING CPR, SHOCK & MEDICATIONS  . CHF (congestive heart failure) ([place])   . Coronary artery disease    90% ostRCA stenosis and total occlusion of dRCA with L-->R collaterals 2017  . Dysrhythmia   . Gout   . High cholesterol   . Hypertension   . Ischemic cardiomyopathy   . LBBB (left  bundle branch block)   . OSA on CPAP   . Type II diabetes mellitus ([place])    Past Surgical History:  Procedure Laterality Date  . CARDIAC CATHETERIZATION N/A 09/07/2016   Procedure: Left Heart Cath and Coronary Angiography;  Surgeon: Peter M Martinique, MD;  Location: [facility];  Service: Cardiovascular;  Laterality: N/A;  . COLONOSCOPY WITH PROPOFOL N/A 08/30/2018   Procedure: COLONOSCOPY WITH PROPOFOL;  Surgeon: Carol Ada, MD;  Location: [facility];  Service: Endoscopy;  Laterality: N/A;  . EP IMPLANTABLE DEVICE N/A 10/10/2016   Procedure: ICD Implant;  Surgeon: Rayhan Groleau Meredith Leeds, MD;  Location: [facility];  Service: Cardiovascular;  Laterality: N/A;  . LUMBAR LAMINECTOMY/DECOMPRESSION MICRODISCECTOMY N/A 01/18/2018   Procedure: L2-3, L3-4, L-4-5 CENTRAL DECOMPRESSION;  Surgeon: [name], MD;  Location: [facility];  Service: Orthopedics;  Laterality: N/A;  . POLYPECTOMY  08/30/2018   Procedure: POLYPECTOMY;  Surgeon: Carol Ada, MD;  Location: [facility];  Service: Endoscopy;;     Current Outpatient Medications  Medication Sig Dispense Refill  . allopurinol (ZYLOPRIM) 300 MG tablet Take 300 mg by mouth daily.    [name] amiodarone (PACERONE) 200 MG tablet Take 1 tablet (200 mg total) by mouth daily. 90 tablet 2  . amoxicillin-clavulanate (AUGMENTIN) 875-125 MG tablet Take 1 tablet by mouth 2 (two) times daily for 3 days. 6 tablet 0  . apixaban (ELIQUIS) 5 MG TABS tablet Take 1 tablet (5 mg total) by mouth 2 (two) times daily. 60 tablet  5  . atorvastatin (LIPITOR) 40 MG tablet Take 1 tablet (40 mg total) by mouth daily at 6 PM. 30 tablet 5  . carvedilol (COREG) 25 MG tablet Take 1 tablet (25 mg total) by mouth 2 (two) times daily with a meal. 60 tablet 7  . CVS VITAMIN D3 1000 units capsule Take 1,000 Units by mouth daily.  5  . furosemide (LASIX) 40 MG tablet Take 1.5 tablets (60 mg total) by mouth 2 (two) times daily. 60 tablet 3  . magnesium oxide (MAG-OX) 400 MG  tablet Take 200 mg by mouth 2 (two) times daily.    . pantoprazole (PROTONIX) 40 MG tablet Take 1 tablet (40 mg total) by mouth daily. 30 tablet 5   No current facility-administered medications for this visit.     Allergies:   Contrast media [iodinated diagnostic agents] and Lisinopril   Social History:  The patient  reports that he has never smoked. He has never used smokeless tobacco. He reports that he does not drink alcohol or use drugs.   Family History:  The patient's family history includes Cancer in his brother; Heart attack in his father; Hypertension in his sister and sister.    ROS:  Please see the history of present illness.   Otherwise, review of systems is positive for none.   All other systems are reviewed and negative.   PHYSICAL EXAM: VS:  BP (!) 142/72   Pulse 70   Ht 5\' 6"  (1.676 m)   Wt 266 lb (120.7 kg)   BMI 42.93 kg/m  , BMI Body mass index is 42.93 kg/m. GEN: Well nourished, well developed, in no acute distress  HEENT: normal  Neck: no JVD, carotid bruits, or masses Cardiac: RRR; no murmurs, rubs, or gallops,no edema  Respiratory:  clear to auscultation bilaterally, normal work of breathing GI: soft, nontender, nondistended, + BS MS: no deformity or atrophy  Skin: warm and dry, device site well healed Neuro:  Strength and sensation are intact Psych: euthymic mood, full affect  EKG:  EKG is not ordered today. Personal review of the ekg ordered 04/06/2019 shows sinus rhythm, IVCD  Personal review of the device interrogation today. Results in [place]: 02/28/2019: TSH 2.943 04/06/2019: B Natriuretic Peptide 910.1 04/07/2019: ALT 27 04/09/2019: BUN 20; Creatinine, Ser 1.39; Hemoglobin 9.3; Magnesium 1.8; Platelets 159; Potassium 3.6; Sodium 139    Lipid Panel     Component Value Date/Time   CHOL 159 09/07/2016 0844   TRIG 146 09/07/2016 0844   HDL 25 (L) 09/07/2016 0844   CHOLHDL 6.4 09/07/2016 0844   VLDL 29 09/07/2016 0844   LDLCALC  105 (H) 09/07/2016 0844     Wt Readings from Last 3 Encounters:  04/11/19 266 lb (120.7 kg)  04/09/19 263 lb 12.8 oz (119.7 kg)  03/10/19 251 lb 9.6 oz (114.1 kg)      Other studies Reviewed: Additional studies/ records that were reviewed today include: TTE 08/28/16, Cath 09/07/16  Review of the above records today demonstrates:  - Left ventricle: The cavity size was mildly dilated. Wall   thickness was increased in a pattern of severe LVH. Systolic   function was moderately reduced. The estimated ejection fraction   was in the range of 35% to 40%. Akinesis of the basalinferior and   apical myocardium. Features are consistent with a pseudonormal   left ventricular filling pattern, with concomitant abnormal   relaxation and increased filling pressure (grade 2 diastolic   dysfunction). -  Aortic valve: There was mild regurgitation. - Mitral valve: There was mild regurgitation. - Left atrium: The atrium was severely dilated.   There is moderate left ventricular systolic dysfunction.  LV end diastolic pressure is normal.  The left ventricular ejection fraction is 35-45% by visual estimate.  Ost RCA lesion, 90 %stenosed.  Mid RCA to Dist RCA lesion, 100 %stenosed.   1. Single vessel occlusive CAD. The distal RCA is occluded with left to right collaterals. There is a 90% ostial stenosis 2. Moderate LV dysfunction with EF 35-40%. Akinesis of the basal to mid inferior wall 3. Normal LVEDP   ASSESSMENT AND PLAN:  1.  VT/VF arrest: Out of hospital cardiac arrest.  He has had normalization of his mental status.  Status post [employer] ICD 10/10/2016.  Device functioning appropriately.  Continue amiodarone.    2. Ischemic cardiomyopathy: RCA disease.  Currently on optimal medical therapy.  ICD implanted for secondary prevention.  3. CAD: No current chest pain  4. Hyperlipidemia: Continue statin  5. Hypertension: Well-controlled  6.  Paroxysmal atrial fibrillation: Found on ICD  interrogation.  Minimally symptomatic.  Continue Eliquis.  This patients CHA2DS2-VASc Score and unadjusted Ischemic Stroke Rate (% per year) is equal to 4.8 % stroke rate/year from a score of 4  Above score calculated as 1 point each if present [CHF, HTN, DM, Vascular=MI/PAD/Aortic Plaque, Age if 65-74, or Male] Above score calculated as 2 points each if present [Age > 75, or Stroke/TIA/TE]    Current medicines are reviewed at length with the patient today.   The patient does not have concerns regarding his medicines.  The following changes were made today: none  Labs/ tests ordered today include:  Orders Placed This Encounter  Procedures  . CUP PACEART INCLINIC DEVICE CHECK     Disposition:   FU with Dean [name] 6 months  Signed, Dean Stys Meredith Leeds, MD  04/11/2019 11:00 AM     [address] [place] [place] [address] [phone] (office) [phone] (fax)

## 2019-04-14 ENCOUNTER — Encounter: Payer: Self-pay | Admitting: Cardiology

## 2019-04-14 NOTE — Progress Notes (Signed)
Remote ICD transmission.   

## 2019-04-21 ENCOUNTER — Telehealth: Payer: Self-pay

## 2019-04-21 ENCOUNTER — Ambulatory Visit (INDEPENDENT_AMBULATORY_CARE_PROVIDER_SITE_OTHER): Payer: Medicare Other

## 2019-04-21 DIAGNOSIS — I5022 Chronic systolic (congestive) heart failure: Secondary | ICD-10-CM | POA: Diagnosis not present

## 2019-04-21 DIAGNOSIS — Z95810 Presence of automatic (implantable) cardiac defibrillator: Secondary | ICD-10-CM | POA: Diagnosis not present

## 2019-04-21 NOTE — Telephone Encounter (Signed)
Remote ICM transmission received.  Attempted call to patient regarding ICM remote transmission and no answer or answering machine. 

## 2019-04-21 NOTE — Progress Notes (Signed)
EPIC Encounter for ICM Monitoring  Patient Name: Dean Chimento Sr. is a 69 y.o. male Date: 04/21/2019 Primary Care Physican: [name], MD Primary Cardiologist:Camnitz Electrophysiologist:Camnitz Last Weight:251lbs (at 03/10/2019 office visit)  Attempted call to patient and unable to reach. Transmission reviewed. Hospitalized 6/4 - 6/7 with dx of AKI and 7/12 - 7/15 with dx of pneumonia.    OptivolThoracic impedancesuggesting possible ongoing fluid accumulation since 03/11/2019 but has slightly improved since OV with Dr Curt Bears.  Prescribed: Furosemide40 mg take 1.5 tablets (60 mg total) by mouth twice a day.  On 04/11/2019 Dosage was increased to 80 mg bid x 3 days by Dr Curt Bears.   Labs: 04/09/2019 Creatinine 1.39, BUN 20, Potassium 3.6, Sodium 139, GFR 51-60 04/08/2019 Creatinine 1.34, BUN 17, Potassium 3.7, Sodium 138, GFR 54->60  04/07/2019 Creatinine 1.42, BUN 15, Potassium 3.2, Sodium 137, GFR 50-58  04/06/2019 Creatinine 1.40, BUN 13, Potassium 3.8, Sodium 137, GFR 51-59  03/10/2019 Creatinine2.39, BUN61,   Potassium4.2, Sodium136, GFR27-31 03/02/2019 Creatinine2.64, BUN79,   Potassium4.8, Sodium136, FOY77-41  03/01/2019 Creatinine3.54, BUN103, Potassium5.2, Sodium135, OIN86-76  02/28/2019 Creatinine4.71, BUN119, Potassium5.2, [id number], GEZ66-29  02/27/2019 Creatinine5.92, UTM546, Potassium4.7, Sodium131, GFR9-10 A complete set of results can be found in Results Review.  Recommendations: Unable to reach.  Follow-up plan: ICM clinic phone appointment on8/11/2018(manual send).  Scheduled office visit with Ellen Henri, [place] on 04/28/2019.   Copy of ICM check sent to [facility]  3 month ICM trend: 04/21/2019    1 Year ICM trend:       Rosalene Billings, RN 04/21/2019 3:04 PM

## 2019-04-22 NOTE — Progress Notes (Signed)
Spoke with patient.  He reports he is feeling better since hospital discharge.  He denies fluid symptoms but weight is probably a few pounds higher than baseline.  A month ago he weighed 149 lbs and today he is 253 lbs.  Transmission reviewed.  Advised of Dr Camnitz's recommendation.  He stated his PCP has already told him to continue to take Furosemide 80 mg bid for now.   Patient has a cardiology appointment 04/28/2019 and requested he send a manual remote transmission on the morning of his OV with Ellen Henri, PA. Encouraged him to call if condition worsens.  Confirmed he has ICM number.

## 2019-04-22 NOTE — Progress Notes (Signed)
Received: Yesterday Message Contents  [name], MD  Delorean Knutzen Panda, RN        Continue 80 mg BID until fluid levels return to normal.

## 2019-04-25 ENCOUNTER — Telehealth: Payer: Self-pay

## 2019-04-25 NOTE — Telephone Encounter (Signed)
No answer when called on either phone number to go over covid19 screening questions.

## 2019-04-28 ENCOUNTER — Other Ambulatory Visit: Payer: Self-pay

## 2019-04-28 ENCOUNTER — Ambulatory Visit (INDEPENDENT_AMBULATORY_CARE_PROVIDER_SITE_OTHER): Payer: Medicare Other | Admitting: Cardiology

## 2019-04-28 ENCOUNTER — Ambulatory Visit (INDEPENDENT_AMBULATORY_CARE_PROVIDER_SITE_OTHER): Payer: Medicare Other

## 2019-04-28 ENCOUNTER — Encounter: Payer: Self-pay | Admitting: Cardiology

## 2019-04-28 VITALS — BP 140/58 | HR 65 | Ht 66.0 in | Wt 254.0 lb

## 2019-04-28 DIAGNOSIS — I10 Essential (primary) hypertension: Secondary | ICD-10-CM

## 2019-04-28 DIAGNOSIS — I5042 Chronic combined systolic (congestive) and diastolic (congestive) heart failure: Secondary | ICD-10-CM | POA: Diagnosis not present

## 2019-04-28 DIAGNOSIS — Z95810 Presence of automatic (implantable) cardiac defibrillator: Secondary | ICD-10-CM

## 2019-04-28 DIAGNOSIS — I739 Peripheral vascular disease, unspecified: Secondary | ICD-10-CM

## 2019-04-28 DIAGNOSIS — I2510 Atherosclerotic heart disease of native coronary artery without angina pectoris: Secondary | ICD-10-CM | POA: Diagnosis not present

## 2019-04-28 DIAGNOSIS — I5022 Chronic systolic (congestive) heart failure: Secondary | ICD-10-CM

## 2019-04-28 MED ORDER — LOSARTAN POTASSIUM 25 MG PO TABS: 25.0000 mg | ORAL_TABLET | Freq: Every day | ORAL | 3 refills | Status: DC

## 2019-04-28 NOTE — Progress Notes (Signed)
EPIC Encounter for ICM Monitoring  Patient Name: Dean Ardoin Sr. is a 68 y.o. male Date: 04/28/2019 Primary Care Physican: [name], MD Primary Cardiologist:Camnitz Electrophysiologist:Camnitz Last Weight:251lbs (at 03/10/2019 office visit)  Transmission reviewed. Hospitalized 6/4 - 6/7 with dx of AKI and 7/12 - 7/15 with dx of pneumonia.   OptivolThoracic impedancesuggesting possible ongoing fluid accumulation since 03/11/2019.  Prescribed: Furosemide40 mg take 1.5 tablets (60 mg total) by mouth twice a day.  On 04/11/2019 Dosage was increased to 80 mg bid x 3 days by Dr Curt Bears.   Labs: 04/09/2019 Creatinine 1.39, BUN 20, Potassium 3.6, Sodium 139, GFR 51-60 04/08/2019 Creatinine 1.34, BUN 17, Potassium 3.7, Sodium 138, GFR 54->60  04/07/2019 Creatinine 1.42, BUN 15, Potassium 3.2, Sodium 137, GFR 50-58  04/06/2019 Creatinine 1.40, BUN 13, Potassium 3.8, Sodium 137, GFR 51-59  03/10/2019 Creatinine2.39, BUN61, Potassium4.2, Sodium136, GFR27-31 03/02/2019 Creatinine2.64, BUN79, Potassium4.8, Sodium136, QPY19-50  03/01/2019 Creatinine3.54, BUN103, Potassium5.2, Sodium135, DTO67-12  02/28/2019 Creatinine4.71, BUN119, Potassium5.2, [id number], ASN05-39  02/27/2019 Creatinine5.92, JQB341, Potassium4.7, Sodium131, GFR9-10 A complete set of results can be found in Results Review.  Recommendations: None, any recommendations will be given at [place] today.  Follow-up plan: ICM clinic phone appointment on8/06/2019.  Scheduled office [place], [name] on 04/28/2019.  Copy of ICM check sent to [place] and Ellen Henri, PA since patient has OV with her today.    3 month ICM trend: 04/28/2019    1 Year ICM trend:       Rosalene Billings, RN 04/28/2019 11:29 AM

## 2019-04-28 NOTE — Progress Notes (Signed)
04/28/2019 Dean Bones Sr.   1951-02-27  [id number]  Primary Physician [name], MD Primary Cardiologist: Pixie Casino, MD  Electrophysiologist: [name], MD   Reason for Visit/CC: Chronic Systolic HF and CAD   HPI:  Dean Bautch Sr. is a 68 y.o. male who is being seen today for further management of chronic systolic heart failure and CAD, at the request of Dr. Curt Bears electrophysiologist.  He has been followed routinely in the EP clinic for his ICD but has not been seen routinely by general cardiology for management of his other cardiac conditions.  I reviewed his chart.  He first established care with our practice in December 2016 when he was admitted for out of hospital VT/VF arrest and was initially seen by Dr. Debara Pickett but has not followed up in clinic with him since that time.  As noted above, he had out of hospital VT VF arrest December 2016 with ROSC w/ CPR and defibrillation.  Cardiac catheterization was performed by Dr. Martinique and showed single-vessel occlusive CAD with 90% ostial RCA stenosis.  The distal RCA was occluded with left to right collaterals.  The LAD and left circumflex were without disease.  Echo at that time showed moderately reduced left ventricular systolic function with an EF of 35 to 40% with grade 2 diastolic dysfunction, mild MR. An ICD was placed for secondary prevention and he was also started on  antiarrhythmic drug therapy with amiodarone.  Device interrogation also later detected atrial fibrillation and he was placed on Eliquis for anticoagulation. His most recent echo was 04/07/19. EF was 30-35%. Mild MR was noted.   Patient was previously on Entresto however this was discontinued after he was admitted last year for dehydration.  Apparently he was on Entresto, Lasix and metolazone all at once.  His only current heart failure medication is carvedilol 25 mg twice daily. He also reports that his PCP recently started him on Jardiance. He gave him  samples at his last visit but pt unsure of the dosage. He has been tolerating this well. He had recent BMP. SCr was 1.3. K 3.6.   Patient reports that from a heart failure standpoint he has done fairly well.  Functional status overall is pretty good. No exertional dyspnea with basic ADLs and moderate activity.  Classified as NYHA Functional Class II.  He also denies lower extremity edema.  No orthopnea or PND.  He denies chest pain.  He does endorse symptoms concerning for bilateral claudication.  He gets pain along the lateral aspect of both hips and thighs with physical activity.  Symptoms resolved with rest.  He checks his weight daily.  His weight has been fairly stable.  He has been trying to monitor his sodium intake.    Cardiac Studies  2D echocardiogram 04/07/2019  IMPRESSIONS    1. The left ventricle has moderate-severely reduced systolic function, with an ejection fraction of 30-35%. The cavity size was normal. Left ventricular diastolic Doppler parameters are consistent with impaired relaxation. Left ventricular diffuse  hypokinesis.  2. The right ventricle has normal systolic function. The cavity was normal. There is no increase in right ventricular wall thickness.  3. Left atrial size was severely dilated.  4. Right atrial size was mildly dilated.  5. The mitral valve is degenerative. Mild calcification of the mitral valve leaflet. Mitral valve regurgitation is moderate by color flow Doppler.  6. The aortic valve is tricuspid. Mild calcification of the aortic valve. Aortic valve regurgitation is trivial by  color flow Doppler. No stenosis of the aortic valve.  7. There is mild dilatation of the aortic root measuring 37 mm.  8. The inferior vena cava was dilated in size with >50% respiratory variability. PA systolic pressure 41 mmHg .   Cardiac catheterization 09/07/2016  Conclusion    There is moderate left ventricular systolic dysfunction.  LV end diastolic pressure is  normal.  The left ventricular ejection fraction is 35-45% by visual estimate.  Ost RCA lesion, 90 %stenosed.  Mid RCA to Dist RCA lesion, 100 %stenosed.   1. Single vessel occlusive CAD. The distal RCA is occluded with left to right collaterals. There is a 90% ostial stenosis 2. Moderate LV dysfunction with EF 35-40%. Akinesis of the basal to mid inferior wall 3. Normal LVEDP  Plan: medical therapy. If he has significant angina we could consider PCI of the RCA but there appears to be significant scar of the inferior wall.      Coronary Diagrams  Diagnostic Dominance: Right    Current Meds  Medication Sig   allopurinol (ZYLOPRIM) 300 MG tablet Take 300 mg by mouth daily.   amiodarone (PACERONE) 200 MG tablet Take 1 tablet (200 mg total) by mouth daily.   apixaban (ELIQUIS) 5 MG TABS tablet Take 1 tablet (5 mg total) by mouth 2 (two) times daily.   atorvastatin (LIPITOR) 40 MG tablet Take 1 tablet (40 mg total) by mouth daily at 6 PM.   carvedilol (COREG) 25 MG tablet Take 1 tablet (25 mg total) by mouth 2 (two) times daily with a meal.   [employer] VITAMIN D3 1000 units capsule Take 1,000 Units by mouth daily.   furosemide (LASIX) 40 MG tablet Take 40 mg by mouth 2 (two) times daily.   magnesium oxide (MAG-OX) 400 MG tablet Take 200 mg by mouth 2 (two) times daily.   pantoprazole (PROTONIX) 40 MG tablet Take 1 tablet (40 mg total) by mouth daily.   Allergies  Allergen Reactions   Contrast Media [Iodinated Diagnostic Agents] Shortness Of Breath and Nausea And Vomiting   Lisinopril Cough   Past Medical History:  Diagnosis Date   AICD (automatic cardioverter/defibrillator) present 10/10/2016   Arthritis    "maybe in his spine" (09/05/2016)   Asthma    pt. denies   Cardiac arrest ([place]) 08/27/2016   REQUIRING CPR, SHOCK & MEDICATIONS   CHF (congestive heart failure) (HCC)    Coronary artery disease    90% ostRCA stenosis and total occlusion of dRCA with L-->R  collaterals 2017   Dysrhythmia    Gout    High cholesterol    Hypertension    Ischemic cardiomyopathy    LBBB (left bundle branch block)    OSA on CPAP    Type II diabetes mellitus ([place])    Family History  Problem Relation Age of Onset   Heart attack Father        died in his 13's   Hypertension Sister    Cancer Brother        uncertain, "leg"   Hypertension Sister    Past Surgical History:  Procedure Laterality Date   CARDIAC CATHETERIZATION N/A 09/07/2016   Procedure: Left Heart Cath and Coronary Angiography;  Surgeon: Peter M Martinique, MD;  Location: [facility];  Service: Cardiovascular;  Laterality: N/A;   COLONOSCOPY WITH PROPOFOL N/A 08/30/2018   Procedure: COLONOSCOPY WITH PROPOFOL;  Surgeon: Carol Ada, MD;  Location: [facility];  Service: Endoscopy;  Laterality: N/A;   EP IMPLANTABLE  DEVICE N/A 10/10/2016   Procedure: ICD Implant;  Surgeon: Will Meredith Leeds, MD;  Location: [facility];  Service: Cardiovascular;  Laterality: N/A;   LUMBAR LAMINECTOMY/DECOMPRESSION MICRODISCECTOMY N/A 01/18/2018   Procedure: L2-3, L3-4, L-4-5 CENTRAL DECOMPRESSION;  Surgeon: [name], MD;  Location: [facility];  Service: Orthopedics;  Laterality: N/A;   POLYPECTOMY  08/30/2018   Procedure: POLYPECTOMY;  Surgeon: Carol Ada, MD;  Location: [facility];  Service: Endoscopy;;   Social History   Socioeconomic History   Marital status: Widowed    Spouse name: Not on file   Number of children: Not on file   Years of education: Not on file   Highest education level: Not on file  Occupational History   Not on file  Social Needs   Financial resource strain: Not on file   Food insecurity    Worry: Not on file    Inability: Not on file   Transportation needs    Medical: Not on file    Non-medical: Not on file  Tobacco Use   Smoking status: Never Smoker   Smokeless tobacco: Never Used  Substance and Sexual Activity   Alcohol use: No    Drug use: No   Sexual activity: Not on file  Lifestyle   Physical activity    Days per week: Not on file    Minutes per session: Not on file   Stress: Not on file  Relationships   Social connections    Talks on phone: Not on file    Gets together: Not on file    Attends religious service: Not on file    Active member of club or organization: Not on file    Attends meetings of clubs or organizations: Not on file    Relationship status: Not on file   Intimate partner violence    Fear of current or ex partner: Not on file    Emotionally abused: Not on file    Physically abused: Not on file    Forced sexual activity: Not on file  Other Topics Concern   Not on file  Social History Narrative   Not on file     Lipid Panel     Component Value Date/Time   CHOL 159 09/07/2016 0844   TRIG 146 09/07/2016 0844   HDL 25 (L) 09/07/2016 0844   CHOLHDL 6.4 09/07/2016 0844   VLDL 29 09/07/2016 0844   LDLCALC 105 (H) 09/07/2016 0844    Review of Systems: General: negative for chills, fever, night sweats or weight changes.  Cardiovascular: negative for chest pain, dyspnea on exertion, edema, orthopnea, palpitations, paroxysmal nocturnal dyspnea or shortness of breath Dermatological: negative for rash Respiratory: negative for cough or wheezing Urologic: negative for hematuria Abdominal: negative for nausea, vomiting, diarrhea, bright red blood per rectum, melena, or hematemesis Neurologic: negative for visual changes, syncope, or dizziness All other systems reviewed and are otherwise negative except as noted above.   Physical Exam:  Blood pressure (!) 140/58, pulse 65, height 5\' 6"  (1.676 m), weight 254 lb (115.2 kg), SpO2 99 %.  General appearance: alert, cooperative, no distress and moderately obese Neck: no carotid bruit and no JVD Lungs: clear to auscultation bilaterally Heart: regular rate and rhythm, S1, S2 normal, no murmur, click, rub or gallop Extremities:  extremities normal, atraumatic, no cyanosis or edema Pulses: 2+ and symmetric Skin: Skin color, texture, turgor normal. No rashes or lesions Neurologic: Grossly normal  EKG not performed -- personally reviewed   ASSESSMENT  AND PLAN:   1. CAD: LHC 08/2016 showed severe single vessel CAD. The distal RCA is occluded with left to right collaterals. There is a 90% ostial stenosis. Medical therapy was recommended. PCI was recommended only if he were to have anginal symptoms + significant scar was a;lso noted. He has done well since. No anginal symptoms. Denies exertional CP or dyspnea. Continue medical therapy.   2. Chronic Systolic HF: recent echo 02/629 showed EF at 30-35%. He already has an ICD for persistently low EF and VT/VT arrest. Euvolemic from a volume standpoint. NYHA Functional Class II symptoms. Will continue Coreg. I've consider Entresto, however it appears he was on this previously but became dehydrated with this in combination w/ Lasix and required hospitalization. His PCP also recently started him on Jardiance. I will opt to start him on Losartan instead. Recent BMP showed SCr at 1.3. K 3.6. F/u BMP in 1 week. We discussed recommendations for the management of chronic heart failure to control volume/symptoms and reduce risk of acute exacerbation that may necessitate hospitalization.  These measures include continuation of current diuretics with close outpatient monitoring of volume status through daily weights.  Patient advised to check weight daily and to call our office if greater than 3 pound weight gain in 24 hours or greater than 5 pound weight gain in the course of 1 week.  Patient also strongly encouraged to adhere to a low salt diet, reducing intake to less than 2 g daily.   3. H/o VT/VF Arrest: Has ICD followed by Dr. Curt Bears. No ICD shocks. He is on amiodarone.   4. Atrial Fibrillation: RRR on exam. On amiodarone (mainily to prevent recurrent VT/VF). On Eliquis for a/c.   5. LE  Claudication:  He endorses bilateral LEE pain/weakness with ambulation. He has multiple CV risk factors including known CAD. We will arrange bilateral LE arterial dopplers w/ ABIs. If suggestive of PVD, I will refer to Dr. Gwenlyn Found or Dr. Fletcher Anon.   6. DM: managed by PCP. Recently started on Jardiance (was given samples) he is unsure of the dosage. I will have RN f/u with phone call when he returns home and will update his medication list.   7. HTN: elevated today at 140/58. Will plan to start Losartan 25 mg for concomitant systolic HF and DM. F/u BMP in 1 week as well as f/u in the HTN clinic.    Follow-Up w/ Dr. Debara Pickett in 2 months.   Gyneth Hubka Ladoris Gene, MHS [facility] 04/28/2019 5:11 PM

## 2019-04-28 NOTE — Patient Instructions (Addendum)
Medication Instructions:  START Losartan 25 mg Daily If you need a refill on your cardiac medications before your next appointment, please call your pharmacy.   Lab work: 7-10 DAYS (SAME DAY AS HTN CLINIC) BMET If you have labs (blood work) drawn today and your tests are completely normal, you will receive your results only by: [name] MyChart Message (if you have MyChart) OR . A paper copy in the mail If you have any lab test that is abnormal or we need to change your treatment, we will call you to review the results.  Testing/Procedures:  PLEASE SCHEDULE Your physician has requested that you have a lower extremity arterial duplex. This test is an ultrasound of the arteries in the legs or arms. It looks at arterial blood flow in the legs and arms. Allow one hour for Lower and Upper Arterial scans. There are no restrictions or special instructions  Vascular [place] Lower Extremity Arterial Duplex @ [place]: HTN CLINIC 7-10 DAYS (SAME DAY AS BMET)  Dr Debara Pickett in 2 MONTHS (please schedule)   At [facility], you and your health needs are our priority.  As part of our continuing mission to provide you with exceptional heart care, we have created designated Provider Care Teams.  These Care Teams include your primary Cardiologist (physician) and Advanced Practice Providers (APPs -  Physician Assistants and Nurse Practitioners) who all work together to provide you with the care you need, when you need it. .   Any Other Special Instructions Will Be Listed Below (If Applicable).  WEIGHT:  Weigh yourself daily.  If you gain more than 3 lbs in 1 day or 5 lbs in 1 week, call our office.  [phone]   DASH Eating Plan DASH stands for "Dietary Approaches to Stop Hypertension." The DASH eating plan is a healthy eating plan that has been shown to reduce high blood pressure (hypertension). It may also reduce your risk for type 2 diabetes, heart disease, and stroke. The DASH eating plan may  also help with weight loss. What are tips for following this plan?  General guidelines  Avoid eating more than 2,300 mg (milligrams) of salt (sodium) a day. If you have hypertension, you may need to reduce your sodium intake to 1,500 mg a day.  Limit alcohol intake to no more than 1 drink a day for nonpregnant women and 2 drinks a day for men. One drink equals 12 oz of beer, 5 oz of wine, or 1 oz of hard liquor.  Work with your health care provider to maintain a healthy body weight or to lose weight. Ask what an ideal weight is for you.  Get at least 30 minutes of exercise that causes your heart to beat faster (aerobic exercise) most days of the week. Activities may include walking, swimming, or biking.  Work with your health care provider or diet and nutrition specialist (dietitian) to adjust your eating plan to your individual calorie needs. Reading food labels   Check food labels for the amount of sodium per serving. Choose foods with less than 5 percent of the Daily Value of sodium. Generally, foods with less than 300 mg of sodium per serving fit into this eating plan.  To find whole grains, look for the word "whole" as the first word in the ingredient list. Shopping  Buy products labeled as "low-sodium" or "no salt added."  Buy fresh foods. Avoid canned foods and premade or frozen meals. Cooking  Avoid adding salt when cooking.  Use salt-free seasonings or herbs instead of table salt or sea salt. Check with your health care provider or pharmacist before using salt substitutes.  Do not fry foods. Cook foods using healthy methods such as baking, boiling, grilling, and broiling instead.  Cook with heart-healthy oils, such as olive, canola, soybean, or sunflower oil. Meal planning  Eat a balanced diet that includes: ? 5 or more servings of fruits and vegetables each day. At each meal, try to fill half of your plate with fruits and vegetables. ? Up to 6-8 servings of whole grains  each day. ? Less than 6 oz of lean meat, poultry, or fish each day. A 3-oz serving of meat is about the same size as a deck of cards. One egg equals 1 oz. ? 2 servings of low-fat dairy each day. ? A serving of nuts, seeds, or beans 5 times each week. ? Heart-healthy fats. Healthy fats called Omega-3 fatty acids are found in foods such as flaxseeds and coldwater fish, like sardines, salmon, and mackerel.  Limit how much you eat of the following: ? Canned or prepackaged foods. ? Food that is high in trans fat, such as fried foods. ? Food that is high in saturated fat, such as fatty meat. ? Sweets, desserts, sugary drinks, and other foods with added sugar. ? Full-fat dairy products.  Do not salt foods before eating.  Try to eat at least 2 vegetarian meals each week.  Eat more home-cooked food and less restaurant, buffet, and fast food.  When eating at a restaurant, ask that your food be prepared with less salt or no salt, if possible. What foods are recommended? The items listed may not be a complete list. Talk with your dietitian about what dietary choices are best for you. Grains Whole-grain or whole-wheat bread. Whole-grain or whole-wheat pasta. Brown rice. [place]ena Morrow. Bulgur. Whole-grain and low-sodium cereals. Pita bread. Low-fat, low-sodium crackers. Whole-wheat flour tortillas. Vegetables Fresh or frozen vegetables (raw, steamed, roasted, or grilled). Low-sodium or reduced-sodium tomato and vegetable juice. Low-sodium or reduced-sodium tomato sauce and tomato paste. Low-sodium or reduced-sodium canned vegetables. Fruits All fresh, dried, or frozen fruit. Canned fruit in natural juice (without added sugar). Meat and other protein foods Skinless chicken or [place]. Ground chicken or [place]. Pork with fat trimmed off. Fish and seafood. Egg whites. Dried beans, peas, or lentils. Unsalted nuts, nut butters, and seeds. Unsalted canned beans. Lean cuts of beef with fat trimmed off.  Low-sodium, lean deli meat. Dairy Low-fat (1%) or fat-free (skim) milk. Fat-free, low-fat, or reduced-fat cheeses. Nonfat, low-sodium ricotta or cottage cheese. Low-fat or nonfat yogurt. Low-fat, low-sodium cheese. Fats and oils Soft margarine without trans fats. Vegetable oil. Low-fat, reduced-fat, or light mayonnaise and salad dressings (reduced-sodium). Canola, safflower, olive, soybean, and sunflower oils. Avocado. Seasoning and other foods Herbs. Spices. Seasoning mixes without salt. Unsalted popcorn and pretzels. Fat-free sweets. What foods are not recommended? The items listed may not be a complete list. Talk with your dietitian about what dietary choices are best for you. Grains Baked goods made with fat, such as croissants, muffins, or some breads. Dry pasta or rice meal packs. Vegetables Creamed or fried vegetables. Vegetables in a cheese sauce. Regular canned vegetables (not low-sodium or reduced-sodium). Regular canned tomato sauce and paste (not low-sodium or reduced-sodium). Regular tomato and vegetable juice (not low-sodium or reduced-sodium). Angie Fava. Olives. Fruits Canned fruit in a light or heavy syrup. Fried fruit. Fruit in cream or butter sauce. Meat and other protein foods Fatty  cuts of meat. Ribs. Fried meat. Berniece Salines. Sausage. Bologna and other processed lunch meats. Salami. Fatback. Hotdogs. Bratwurst. Salted nuts and seeds. Canned beans with added salt. Canned or smoked fish. Whole eggs or egg yolks. Chicken or [place] with skin. Dairy Whole or 2% milk, cream, and half-and-half. Whole or full-fat cream cheese. Whole-fat or sweetened yogurt. Full-fat cheese. Nondairy creamers. Whipped toppings. Processed cheese and cheese spreads. Fats and oils Butter. Stick margarine. Lard. Shortening. Ghee. Bacon fat. Tropical oils, such as coconut, palm kernel, or palm oil. Seasoning and other foods Salted popcorn and pretzels. Onion salt, garlic salt, seasoned salt, table salt, and sea  salt. Worcestershire sauce. Tartar sauce. Barbecue sauce. Teriyaki sauce. Soy sauce, including reduced-sodium. Steak sauce. Canned and packaged gravies. Fish sauce. Oyster sauce. Cocktail sauce. Horseradish that you find on the shelf. Ketchup. Mustard. Meat flavorings and tenderizers. Bouillon cubes. Hot sauce and Tabasco sauce. Premade or packaged marinades. Premade or packaged taco seasonings. Relishes. Regular salad dressings. Where to find more information:  National Heart, Lung, and [place]: https://wilson-eaton.com/  American Heart Association: www.heart.org Summary  The DASH eating plan is a healthy eating plan that has been shown to reduce high blood pressure (hypertension). It may also reduce your risk for type 2 diabetes, heart disease, and stroke.  With the DASH eating plan, you should limit salt (sodium) intake to 2,300 mg a day. If you have hypertension, you may need to reduce your sodium intake to 1,500 mg a day.  When on the DASH eating plan, aim to eat more fresh fruits and vegetables, whole grains, lean proteins, low-fat dairy, and heart-healthy fats.  Work with your health care provider or diet and nutrition specialist (dietitian) to adjust your eating plan to your individual calorie needs. This information is not intended to replace advice given to you by your health care provider. Make sure you discuss any questions you have with your health care provider. Document Released: 08/31/2011 Document Revised: 08/24/2017 Document Reviewed: 09/04/2016 Elsevier Patient Education  2020 [employer].

## 2019-04-29 ENCOUNTER — Telehealth: Payer: Self-pay | Admitting: Cardiology

## 2019-04-29 ENCOUNTER — Other Ambulatory Visit: Payer: Self-pay

## 2019-04-29 MED ORDER — JARDIANCE 25 MG PO TABS: 25.0000 mg | ORAL_TABLET | Freq: Every day | ORAL | 0 refills | Status: DC

## 2019-04-29 NOTE — Telephone Encounter (Signed)
Follow UP:     Pt says he returningaca call from today., but he does not know who called.

## 2019-04-29 NOTE — Telephone Encounter (Signed)
I spoke to the patient and confirmed Jardiance 25 mg Daily.

## 2019-04-30 ENCOUNTER — Other Ambulatory Visit: Payer: Self-pay | Admitting: Cardiology

## 2019-04-30 DIAGNOSIS — I739 Peripheral vascular disease, unspecified: Secondary | ICD-10-CM

## 2019-05-05 ENCOUNTER — Other Ambulatory Visit: Payer: Self-pay

## 2019-05-05 ENCOUNTER — Ambulatory Visit (INDEPENDENT_AMBULATORY_CARE_PROVIDER_SITE_OTHER): Payer: Medicare Other

## 2019-05-05 ENCOUNTER — Inpatient Hospital Stay (HOSPITAL_COMMUNITY)
Admission: EM | Admit: 2019-05-05 | Discharge: 2019-05-08 | DRG: 871 | Disposition: A | Discharge status: Home / Self Care | Payer: Medicare Other | Attending: Internal Medicine | Admitting: Internal Medicine

## 2019-05-05 ENCOUNTER — Telehealth: Payer: Self-pay

## 2019-05-05 ENCOUNTER — Encounter (HOSPITAL_COMMUNITY): Payer: Self-pay | Admitting: *Deleted

## 2019-05-05 ENCOUNTER — Emergency Department (HOSPITAL_COMMUNITY): Payer: Medicare Other

## 2019-05-05 DIAGNOSIS — J189 Pneumonia, unspecified organism: Secondary | ICD-10-CM | POA: Diagnosis not present

## 2019-05-05 DIAGNOSIS — J683 Other acute and subacute respiratory conditions due to chemicals, gases, fumes and vapors: Secondary | ICD-10-CM | POA: Diagnosis present

## 2019-05-05 DIAGNOSIS — E785 Hyperlipidemia, unspecified: Secondary | ICD-10-CM | POA: Diagnosis present

## 2019-05-05 DIAGNOSIS — N179 Acute kidney failure, unspecified: Secondary | ICD-10-CM | POA: Diagnosis present

## 2019-05-05 DIAGNOSIS — R6520 Severe sepsis without septic shock: Secondary | ICD-10-CM

## 2019-05-05 DIAGNOSIS — Z8701 Personal history of pneumonia (recurrent): Secondary | ICD-10-CM

## 2019-05-05 DIAGNOSIS — I5022 Chronic systolic (congestive) heart failure: Secondary | ICD-10-CM | POA: Diagnosis present

## 2019-05-05 DIAGNOSIS — Z95810 Presence of automatic (implantable) cardiac defibrillator: Secondary | ICD-10-CM

## 2019-05-05 DIAGNOSIS — J9601 Acute respiratory failure with hypoxia: Secondary | ICD-10-CM | POA: Diagnosis present

## 2019-05-05 DIAGNOSIS — A419 Sepsis, unspecified organism: Principal | ICD-10-CM | POA: Diagnosis present

## 2019-05-05 DIAGNOSIS — Y92002 Bathroom of unspecified non-institutional (private) residence single-family (private) house as the place of occurrence of the external cause: Secondary | ICD-10-CM

## 2019-05-05 DIAGNOSIS — N1832 Chronic kidney disease, stage 3b: Secondary | ICD-10-CM

## 2019-05-05 DIAGNOSIS — I248 Other forms of acute ischemic heart disease: Secondary | ICD-10-CM | POA: Diagnosis present

## 2019-05-05 DIAGNOSIS — Z8249 Family history of ischemic heart disease and other diseases of the circulatory system: Secondary | ICD-10-CM

## 2019-05-05 DIAGNOSIS — I2510 Atherosclerotic heart disease of native coronary artery without angina pectoris: Secondary | ICD-10-CM | POA: Diagnosis present

## 2019-05-05 DIAGNOSIS — E1122 Type 2 diabetes mellitus with diabetic chronic kidney disease: Secondary | ICD-10-CM | POA: Diagnosis present

## 2019-05-05 DIAGNOSIS — I447 Left bundle-branch block, unspecified: Secondary | ICD-10-CM | POA: Diagnosis present

## 2019-05-05 DIAGNOSIS — Z91041 Radiographic dye allergy status: Secondary | ICD-10-CM

## 2019-05-05 DIAGNOSIS — J9801 Acute bronchospasm: Secondary | ICD-10-CM

## 2019-05-05 DIAGNOSIS — J45901 Unspecified asthma with (acute) exacerbation: Secondary | ICD-10-CM

## 2019-05-05 DIAGNOSIS — T5491XA Toxic effect of unspecified corrosive substance, accidental (unintentional), initial encounter: Secondary | ICD-10-CM | POA: Diagnosis present

## 2019-05-05 DIAGNOSIS — R0603 Acute respiratory distress: Secondary | ICD-10-CM | POA: Diagnosis present

## 2019-05-05 DIAGNOSIS — Y95 Nosocomial condition: Secondary | ICD-10-CM | POA: Diagnosis present

## 2019-05-05 DIAGNOSIS — G4733 Obstructive sleep apnea (adult) (pediatric): Secondary | ICD-10-CM | POA: Diagnosis present

## 2019-05-05 DIAGNOSIS — Z79899 Other long term (current) drug therapy: Secondary | ICD-10-CM

## 2019-05-05 DIAGNOSIS — Z8674 Personal history of sudden cardiac arrest: Secondary | ICD-10-CM

## 2019-05-05 DIAGNOSIS — N183 Chronic kidney disease, stage 3 (moderate): Secondary | ICD-10-CM | POA: Diagnosis present

## 2019-05-05 DIAGNOSIS — I130 Hypertensive heart and chronic kidney disease with heart failure and stage 1 through stage 4 chronic kidney disease, or unspecified chronic kidney disease: Secondary | ICD-10-CM | POA: Diagnosis present

## 2019-05-05 DIAGNOSIS — Z888 Allergy status to other drugs, medicaments and biological substances status: Secondary | ICD-10-CM

## 2019-05-05 DIAGNOSIS — I255 Ischemic cardiomyopathy: Secondary | ICD-10-CM | POA: Diagnosis present

## 2019-05-05 DIAGNOSIS — I480 Paroxysmal atrial fibrillation: Secondary | ICD-10-CM | POA: Diagnosis present

## 2019-05-05 DIAGNOSIS — Z7901 Long term (current) use of anticoagulants: Secondary | ICD-10-CM

## 2019-05-05 DIAGNOSIS — M109 Gout, unspecified: Secondary | ICD-10-CM | POA: Diagnosis present

## 2019-05-05 DIAGNOSIS — Z20828 Contact with and (suspected) exposure to other viral communicable diseases: Secondary | ICD-10-CM | POA: Diagnosis present

## 2019-05-05 LAB — BASIC METABOLIC PANEL
Anion gap: 14 (ref 5–15)
BUN: 21 mg/dL (ref 8–23)
CO2: 22 mmol/L (ref 22–32)
Calcium: 10 mg/dL (ref 8.9–10.3)
Chloride: 102 mmol/L (ref 98–111)
Creatinine, Ser: 2.02 mg/dL — ABNORMAL HIGH (ref 0.61–1.24)
GFR calc Af Amer: 38 mL/min — ABNORMAL LOW (ref >60)
GFR calc non Af Amer: 33 mL/min — ABNORMAL LOW (ref >60)
Glucose, Bld: 193 mg/dL — ABNORMAL HIGH (ref 70–99)
Potassium: 4.7 mmol/L (ref 3.5–5.1)
Sodium: 138 mmol/L (ref 135–145)

## 2019-05-05 LAB — CBC
HCT: 46.3 % (ref 39.0–52.0)
Hemoglobin: 14.4 g/dL (ref 13.0–17.0)
MCH: 29.3 pg (ref 26.0–34.0)
MCHC: 31.1 g/dL (ref 30.0–36.0)
MCV: 94.1 fL (ref 80.0–100.0)
Platelets: 197 10*3/uL (ref 150–400)
RBC: 4.92 MIL/uL (ref 4.22–5.81)
RDW: 14.5 % (ref 11.5–15.5)
WBC: 16.6 10*3/uL — ABNORMAL HIGH (ref 4.0–10.5)
nRBC: 0 % (ref 0.0–0.2)

## 2019-05-05 LAB — LACTIC ACID, PLASMA: Lactic Acid, Venous: 2.9 mmol/L (ref 0.5–1.9)

## 2019-05-05 LAB — SARS CORONAVIRUS 2 BY RT PCR (HOSPITAL ORDER, PERFORMED IN ~~LOC~~ HOSPITAL LAB): SARS Coronavirus 2: NEGATIVE

## 2019-05-05 LAB — BRAIN NATRIURETIC PEPTIDE: B Natriuretic Peptide: 329.5 pg/mL — ABNORMAL HIGH (ref 0.0–100.0)

## 2019-05-05 LAB — TROPONIN I (HIGH SENSITIVITY)
Troponin I (High Sensitivity): 12 ng/L
Troponin I (High Sensitivity): 27 ng/L — ABNORMAL HIGH (ref <18)

## 2019-05-05 MED ORDER — SODIUM CHLORIDE 0.9 % IV BOLUS: 1000.0000 mL | Freq: Once | INTRAVENOUS | Status: DC

## 2019-05-05 MED ORDER — AEROCHAMBER PLUS FLO-VU LARGE MISC
Status: AC
  Administered 2019-05-05: 1
  Filled 2019-05-05: qty 1

## 2019-05-05 MED ORDER — INSULIN ASPART 100 UNIT/ML ~~LOC~~ SOLN
0.0000 [IU] | Freq: Three times a day (TID) | SUBCUTANEOUS | Status: DC
  Administered 2019-05-06 – 2019-05-08 (×5): 1 [IU] via SUBCUTANEOUS

## 2019-05-05 MED ORDER — IPRATROPIUM-ALBUTEROL 0.5-2.5 (3) MG/3ML IN SOLN
3.0000 mL | Freq: Four times a day (QID) | RESPIRATORY_TRACT | Status: DC
  Administered 2019-05-06: 3 mL via RESPIRATORY_TRACT
  Filled 2019-05-05: qty 3

## 2019-05-05 MED ORDER — PIPERACILLIN-TAZOBACTAM 3.375 G IVPB 30 MIN
3.3750 g | Freq: Once | INTRAVENOUS | Status: AC
  Administered 2019-05-06: 3.375 g via INTRAVENOUS

## 2019-05-05 MED ORDER — PIPERACILLIN-TAZOBACTAM 3.375 G IVPB
3.3750 g | Freq: Three times a day (TID) | INTRAVENOUS | Status: DC
  Administered 2019-05-06 – 2019-05-08 (×6): 3.375 g via INTRAVENOUS
  Filled 2019-05-05 (×9): qty 50

## 2019-05-05 MED ORDER — ALBUTEROL SULFATE (2.5 MG/3ML) 0.083% IN NEBU: 2.5000 mg | INHALATION_SOLUTION | RESPIRATORY_TRACT | Status: DC | PRN

## 2019-05-05 MED ORDER — SODIUM CHLORIDE 0.9 % IV SOLN
1.0000 g | Freq: Once | INTRAVENOUS | Status: AC
  Administered 2019-05-05: 1 g via INTRAVENOUS
  Filled 2019-05-05: qty 10

## 2019-05-05 MED ORDER — ACETAMINOPHEN 325 MG PO TABS
650.0000 mg | ORAL_TABLET | Freq: Four times a day (QID) | ORAL | Status: DC | PRN
  Filled 2019-05-05: qty 2

## 2019-05-05 MED ORDER — METHYLPREDNISOLONE SODIUM SUCC 125 MG IJ SOLR
125.0000 mg | Freq: Once | INTRAMUSCULAR | Status: AC
  Administered 2019-05-05: 125 mg via INTRAVENOUS
  Filled 2019-05-05: qty 2

## 2019-05-05 MED ORDER — SODIUM CHLORIDE 0.9 % IV BOLUS
500.0000 mL | Freq: Once | INTRAVENOUS | Status: AC
  Administered 2019-05-06: 500 mL via INTRAVENOUS

## 2019-05-05 MED ORDER — INSULIN ASPART 100 UNIT/ML ~~LOC~~ SOLN: 0.0000 [IU] | Freq: Every day | SUBCUTANEOUS | Status: DC

## 2019-05-05 MED ORDER — APIXABAN 5 MG PO TABS
5.0000 mg | ORAL_TABLET | Freq: Two times a day (BID) | ORAL | Status: DC
  Administered 2019-05-06 – 2019-05-08 (×5): 5 mg via ORAL
  Filled 2019-05-05 (×5): qty 1

## 2019-05-05 MED ORDER — ACETAMINOPHEN 650 MG RE SUPP: 650.0000 mg | Freq: Four times a day (QID) | RECTAL | Status: DC | PRN

## 2019-05-05 MED ORDER — SODIUM CHLORIDE 0.9 % IV SOLN
500.0000 mg | Freq: Once | INTRAVENOUS | Status: AC
  Administered 2019-05-05: 500 mg via INTRAVENOUS
  Filled 2019-05-05: qty 500

## 2019-05-05 MED ORDER — ALBUTEROL SULFATE HFA 108 (90 BASE) MCG/ACT IN AERS
4.0000 | INHALATION_SPRAY | Freq: Once | RESPIRATORY_TRACT | Status: AC
  Administered 2019-05-05: 4 via RESPIRATORY_TRACT
  Filled 2019-05-05: qty 6.7

## 2019-05-05 NOTE — ED Provider Notes (Signed)
[facility] EMERGENCY DEPARTMENT Provider Note   CSN: [id number] Arrival date & time: 05/05/19  [place]    History   Chief Complaint Chief Complaint  Patient presents with  . Respiratory Distress    inhaled chemicals    HPI Dean Stauber Sr. is a 68 y.o. male.     HPI Patient presents to the ED for shortness of breath.  Patient states his symptoms started after Dean [name] was exposed to chemicals at his home.  Patient was trying to unclog plumbing.  Patient states Dean [name] used bleach.  Nursing reports indicate sulfuric acid and Drano but the patient states Dean [name] was just using Clorox.  Patient began feeling short of breath.  Dean [name] also started become sweaty and felt his heart race.  Dean [name] denies having any trouble prior to this exposure.  Dean [name] denies any chest pain.  Patient does have a history of CHF and according to the records asthma.  Dean [name] uses CPAP at home and was given oxygen by EMS.  Dean [name] is feeling better but still feels short of breath. Past Medical History:  Diagnosis Date  . 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 Arthritis    "maybe in his spine" (09/05/2016)  . Asthma    pt. denies  . Cardiac arrest ([place]) 08/27/2016   REQUIRING CPR, SHOCK & MEDICATIONS  . CHF (congestive heart failure) ([place])   . Coronary artery disease    90% ostRCA stenosis and total occlusion of dRCA with L-->R collaterals 2017  . Dysrhythmia   . Gout   . High cholesterol   . Hypertension   . Ischemic cardiomyopathy   . LBBB (left bundle branch block)   . OSA on CPAP   . Type II diabetes mellitus [facility])     Patient Active Problem List   Diagnosis Date Noted  . Acute respiratory failure with hypoxia ([place]) 04/06/2019  . HCAP (healthcare-associated pneumonia) 04/06/2019  . Acute kidney failure ([place]) 02/27/2019  . Chronic combined systolic and diastolic CHF (congestive heart failure) ([place]) 02/27/2019  . CKD (chronic kidney disease), stage III ([place]) 05/16/2018  .  Normochromic normocytic anemia 05/15/2018  . CAD (coronary artery disease) 12/17/2017  . Hyperlipidemia LDL goal <70 12/17/2017  . 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 Cardiomyopathy, ischemic   . Debility 10/04/2016  . Stage 3 chronic kidney disease ([place])   . Disorientated   . Anoxic brain injury ([place]) 09/15/2016  . Benign essential HTN   . OSA (obstructive sleep apnea)   . Transaminitis   . Chronic respiratory failure with hypoxia ([place])   . Fever   . NSVT (nonsustained ventricular tachycardia) ([place])   . Uncomplicated asthma   . Diabetes mellitus type 2 in obese ([place])   . Acute on chronic combined systolic and diastolic CHF (congestive heart failure) ([place])   . Tachypnea   . Hypokalemia   . Leukocytosis   . Hypoxemia   . Acute respiratory failure ([place])   . Cardiopulmonary arrest ([place]) 08/27/2016  . DM2 (diabetes mellitus, type 2) ([place]) 03/02/2014  . Morbid obesity ([place]) 03/02/2014  . Essential hypertension, benign 03/02/2014    Past Surgical History:  Procedure Laterality Date  . CARDIAC CATHETERIZATION N/A 09/07/2016   Procedure: Left Heart Cath and Coronary Angiography;  Surgeon: Peter M Martinique, MD;  Location: [place] [facility] CV LAB;  Service: Cardiovascular;  Laterality: N/A;  . COLONOSCOPY WITH PROPOFOL N/A 08/30/2018   Procedure: COLONOSCOPY WITH PROPOFOL;  Surgeon: Carol Ada, MD;  Location: [facility] ENDOSCOPY;  Service: Endoscopy;  Laterality: N/A;  . EP IMPLANTABLE DEVICE N/A 10/10/2016   Procedure: ICD Implant;  Surgeon: Will Meredith Leeds, MD;  Location: [facility] CV LAB;  Service: Cardiovascular;  Laterality: N/A;  . LUMBAR LAMINECTOMY/DECOMPRESSION MICRODISCECTOMY N/A 01/18/2018   Procedure: L2-3, L3-4, L-4-5 CENTRAL DECOMPRESSION;  Surgeon: [name], MD;  Location: [facility];  Service: Orthopedics;  Laterality: N/A;  . POLYPECTOMY  08/30/2018   Procedure: POLYPECTOMY;  Surgeon: Carol Ada, MD;  Location: [facility];  Service:  Endoscopy;;        Home Medications    Prior to Admission medications   Medication Sig Start Date End Date Taking? Authorizing Provider  allopurinol (ZYLOPRIM) 300 MG tablet Take 300 mg by mouth daily. 08/16/16   [provider]  amiodarone (PACERONE) 200 MG tablet Take 1 tablet (200 mg total) by mouth daily. 02/18/19   Camnitz, Ocie Doyne, MD  apixaban (ELIQUIS) 5 MG TABS tablet Take 1 tablet (5 mg total) by mouth 2 (two) times daily. 12/19/18   Camnitz, Ocie Doyne, MD  atorvastatin (LIPITOR) 40 MG tablet Take 1 tablet (40 mg total) by mouth daily at 6 PM. 10/27/16   Camnitz, Ocie Doyne, MD  carvedilol (COREG) 25 MG tablet Take 1 tablet (25 mg total) by mouth 2 (two) times daily with a meal. 05/31/18   Duke, Tami Lin, PA  [employer] VITAMIN D3 1000 units capsule Take 1,000 Units by mouth daily. 08/16/16   [provider]  empagliflozin (JARDIANCE) 25 MG TABS tablet Take 25 mg by mouth daily. 04/29/19   Lyda Jester M, PA-C  furosemide (LASIX) 40 MG tablet Take 40 mg by mouth 2 (two) times daily.    [provider]  losartan (COZAAR) 25 MG tablet Take 1 tablet (25 mg total) by mouth daily. 04/28/19   Lyda Jester M, PA-C  magnesium oxide (MAG-OX) 400 MG tablet Take 200 mg by mouth 2 (two) times daily.    [provider]  pantoprazole (PROTONIX) 40 MG tablet Take 1 tablet (40 mg total) by mouth daily. 07/01/18   Duke, Tami Lin, PA    Family History Family History  Problem Relation Age of Onset  . Heart attack Father        died in his 67's  . Hypertension Sister   . Cancer Brother        uncertain, "leg"  . Hypertension Sister     Social History Social History   Tobacco Use  . Smoking status: Never Smoker  . Smokeless tobacco: Never Used  Substance Use Topics  . Alcohol use: No  . Drug use: No     Allergies   Contrast media [iodinated diagnostic agents] and Lisinopril   Review of Systems Review of Systems  Constitutional:  Negative for fever.  Respiratory: Positive for cough and shortness of breath.   Neurological: Negative for weakness.  All other systems reviewed and are negative.    Physical Exam Updated Vital Signs BP (!) 141/55   Pulse 72   Resp 17   Ht 1.676 m (5\' 6" )   Wt 115 kg   SpO2 100%   BMI 40.92 kg/m   Physical Exam Vitals signs and nursing note reviewed.  Constitutional:      Appearance: Dean [name] is well-developed. Dean [name] is ill-appearing.  HENT:     Head: Normocephalic and atraumatic.     Right Ear: External ear normal.     Left Ear: External ear normal.  Eyes:     General: No  scleral icterus.       Right eye: No discharge.        Left eye: No discharge.     Conjunctiva/sclera: Conjunctivae normal.  Neck:     Musculoskeletal: Neck supple.     Trachea: No tracheal deviation.  Cardiovascular:     Rate and Rhythm: Normal rate and regular rhythm.  Pulmonary:     Effort: Tachypnea and accessory muscle usage present. No respiratory distress.     Breath sounds: No stridor. Wheezing and rales present.  Abdominal:     General: Bowel sounds are normal. There is no distension.     Palpations: Abdomen is soft.     Tenderness: There is no abdominal tenderness. There is no guarding or rebound.  Musculoskeletal:        General: No tenderness.  Skin:    General: Skin is warm and moist.     Findings: No rash.     Comments: diaphoretic  Neurological:     Mental Status: Dean [name] is alert.     Cranial Nerves: No cranial nerve deficit (no facial droop, extraocular movements intact, no slurred speech).     Sensory: No sensory deficit.     Motor: No abnormal muscle tone or seizure activity.     Coordination: Coordination normal.      ED Treatments / Results  Labs (all labs ordered are listed, but only abnormal results are displayed) Labs Reviewed  BASIC METABOLIC PANEL - Abnormal; Notable for the following components:      Result Value   Glucose, Bld 193 (*)    Creatinine, Ser 2.02 (*)     GFR calc non Af Amer 33 (*)    GFR calc Af Amer 38 (*)    All other components within normal limits  BRAIN NATRIURETIC PEPTIDE - Abnormal; Notable for the following components:   B Natriuretic Peptide 329.5 (*)    All other components within normal limits  LACTIC ACID, PLASMA - Abnormal; Notable for the following components:   Lactic Acid, Venous 2.9 (*)    All other components within normal limits  CBC - Abnormal; Notable for the following components:   WBC 16.6 (*)    All other components within normal limits  TROPONIN I (HIGH SENSITIVITY) - Abnormal; Notable for the following components:   Troponin I (High Sensitivity) 27 (*)    All other components within normal limits  SARS CORONAVIRUS 2 (HOSPITAL ORDER, [facility])  CULTURE, BLOOD (ROUTINE X 2)  CULTURE, BLOOD (ROUTINE X 2)  LACTIC ACID, PLASMA  I-STAT ARTERIAL BLOOD GAS, ED  TROPONIN I (HIGH SENSITIVITY)    EKG None  Radiology Dg Chest Port 1 View  Result Date: 05/05/2019 CLINICAL DATA:  Shortness of breath. EXAM: PORTABLE CHEST 1 VIEW COMPARISON:  04/06/2019 FINDINGS: Airspace consolidation is noted throughout most of the right lung with sparing of the right apex. Mild interstitial prominence noted lower lung on the left, stable from the prior exam. No left lung consolidation. No convincing pleural effusion.  No pneumothorax. Cardiac silhouette is mildly enlarged. No mediastinal or hilar masses. Stable left anterior chest wall AICD. IMPRESSION: 1. Right sided airspace lung opacities consistent with multifocal pneumonia. Electronically Signed   By: Lajean Manes M.D.   On: 05/05/2019 19:26    Procedures .Critical Care Performed by: Dorie Rank, MD Authorized by: Dorie Rank, MD   Critical care provider statement:    Critical care time (minutes):  30   Critical care was time  spent personally by me on the following activities:  Discussions with consultants, evaluation of patient's response to  treatment, examination of patient, ordering and performing treatments and interventions, ordering and review of laboratory studies, ordering and review of radiographic studies, pulse oximetry, re-evaluation of patient's condition, obtaining history from patient or surrogate and review of old charts   (including critical care time)  Medications Ordered in ED Medications  cefTRIAXone (ROCEPHIN) 1 g in sodium chloride 0.9 % 100 mL IVPB (1 g Intravenous New Bag/Given 05/05/19 2225)  azithromycin (ZITHROMAX) 500 mg in sodium chloride 0.9 % 250 mL IVPB (500 mg Intravenous New Bag/Given 05/05/19 2225)  albuterol (VENTOLIN HFA) 108 (90 Base) MCG/ACT inhaler 4 puff (4 puffs Inhalation Given 05/05/19 1958)  methylPREDNISolone sodium succinate (SOLU-MEDROL) 125 mg/2 mL injection 125 mg (125 mg Intravenous Given 05/05/19 2210)  AeroChamber Plus Flo-Vu Large MISC (1 each  Given 05/05/19 1958)     Initial Impression / Assessment and Plan / ED Course  I have reviewed the triage vital signs and the nursing notes.  Pertinent labs & imaging results that were available during my care of the patient were reviewed by me and considered in my medical decision making (see chart for details).  Clinical Course as of May 05 2227  Mon May 05, 2019  1933 Chest x-ray suggest multifocal pneumonia.  Lactic acid level ordered.  Blood cultures and antibiotics ordered   [JK]  2227 Breathing is much improved.  Oxygen saturation is 100%.  Patient is breathing more easily and is no longer diaphoretic.   [JK]    Clinical Course User Index [JK] Dorie Rank, MD     Patient presented to the ED for acute respiratory distress.  Patient has improved with albuterol and steroids.  His work of breathing is back to baseline.  Patient is also no longer diaphoretic.  X-ray shows a multifocal pneumonia.  This is somewhat surprising considering his symptoms were triggered by a chemical exposure.  Patient does have previous history of pneumonia  and Dean [name] does have an elevated white blood cell count.  Antibiotics have been ordered.  Patient has remained normotensive.  I doubt sepsis.  I will consult the medical service for admission and further treatment.  Final Clinical Impressions(s) / ED Diagnoses   Final diagnoses:  Bronchospasm  Community acquired pneumonia, unspecified laterality    ED Discharge Orders    None       Dorie Rank, MD 05/05/19 2231

## 2019-05-05 NOTE — ED Triage Notes (Signed)
Pt here from home via [facility] for respiratory distress after he mixed sulfuric acid and draino to clean his toilet.  Pt was on his C-pap when ems arrived with sats of 91%.  On way to hosp pt became increasingly tachypneic.  Now on nrb - labored.

## 2019-05-05 NOTE — Telephone Encounter (Signed)
Remote ICM transmission received.  Attempted call to patient regarding ICM remote transmission and left message to return call   

## 2019-05-05 NOTE — Progress Notes (Signed)
EPIC Encounter for ICM Monitoring  Patient Name: Dean Ulbricht Sr. is a 68 y.o. male Date: 05/05/2019 Primary Care Physican: [name], MD Primary Cardiologist:Camnitz Electrophysiologist:Camnitz Last Weight:254lbs (at 04/28/2019 office visit)  Attempted call to patient and unable to reach.  Left message to return call. Transmission reviewed.   Hospitalized 6/4 - 6/7 with dx of AKIand 7/12 - 7/15 with dx of pneumonia.  OptivolThoracic impedance improving since 8/3 transmission.  Prescribed: Furosemide40 mg take 1.5 tablets (60 mg total) by mouth twice a day.On 04/11/2019 Dosage was increased to 80 mg bid x 3 days by Dr Curt Bears.  Labs: 04/09/2019 Creatinine1.39, BUN20, Potassium3.6, [id number], WOE32-12 04/08/2019 Creatinine1.34, BUN17, Potassium3.7, [id number], GFR54->60  04/07/2019 Creatinine1.42, BUN15, Potassium3.2, [id number], IHW38-88  04/06/2019 Creatinine1.40, BUN13, Potassium3.8, [id number], HXT05-69 03/10/2019 Creatinine2.39, BUN61, Potassium4.2, Sodium136, VXY80-16 03/02/2019 Creatinine2.64, BUN79, Potassium4.8, PVVZSM270, BEM75-44  03/01/2019 Creatinine3.54, BUN103, Potassium5.2, BEEFEO712, RFX58-83  02/28/2019 Creatinine4.71, GPQ982, Potassium5.2, MEBRAX094, GFR12-14  02/27/2019 Creatinine5.92, MHW808, Potassium4.7, Sodium131, GFR9-10 A complete set of results can be found in Results Review.  Recommendations: Unable to reach.    Follow-up plan: ICM clinic phone appointment on8/24/2020 to recheck fluid levels.   Copy of ICM check sent to [place] and Ellen Henri, PA.   3 month ICM trend: 05/05/2019    1 Year ICM trend:       Rosalene Billings, RN 05/05/2019 12:54 PM

## 2019-05-05 NOTE — H&P (Signed)
History and Physical    Dean Leeth Sr. UXN:[id number] DOB: 1951/05/05 DOA: 05/05/2019  PCP: [name], MD Patient coming from: Home  Chief Complaint: Shortness of breath  HPI: Dean Russey Sr. is a 68 y.o. 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 Patient states his toilet was clogged so his daughter poured Drano into it.  The toilet continue to be clogged so then he put some bleach in it which caused him to inhale fumes.  He then started feeling short of breath and coughing.  States prior to this he was feeling well the last few days and was not having any shortness of breath, cough, or chest pain.  Denies orthopnea or lower extremity edema.  Denies fevers, chills, or fatigue.  He continues to takes Lasix twice a day.  Denies any nausea, vomiting, abdominal pain, or diarrhea.  No other complaints.  Patient was admitted 7/12 to 7/15 for acute hypoxic respiratory failure secondary to multifocal pneumonia.  He was treated with cefepime during his hospitalization and discharged home on oral Augmentin to complete a 5-day course of antibiotics.  ED Course: Afebrile.  Initially tachycardic and tachypneic on arrival, now improved.  Not hypotensive.  White count 16.6.  Lactic acid 2.9.  Creatinine 2.0, was 1.3 on 04/09/2019.  BNP 329.  High-sensitivity troponin 12 >27.  EKG without acute ischemic changes.  COVID-19 rapid test negative.  Blood culture x2 pending.  Chest x-ray showing right-sided airspace lung opacities consistent with multifocal pneumonia. Wheezing on exam.  Patient received albuterol, Solu-Medrol 125 mg, azithromycin, and ceftriaxone.  Review of Systems:  All systems reviewed and apart from history of  presenting illness, are negative.  Past Medical History:  Diagnosis Date  . 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 Arthritis    "maybe in his spine" (09/05/2016)  . Asthma    pt. denies  . Cardiac arrest ([place]) 08/27/2016   REQUIRING CPR, SHOCK & MEDICATIONS  . CHF (congestive heart failure) ([place])   . Coronary artery disease    90% ostRCA stenosis and total occlusion of dRCA with L-->R collaterals 2017  . Dysrhythmia   . Gout   . High cholesterol   . Hypertension   . Ischemic cardiomyopathy   . LBBB (left bundle branch block)   . OSA on CPAP   . Type II diabetes mellitus ([place])     Past Surgical History:  Procedure Laterality Date  . CARDIAC CATHETERIZATION N/A 09/07/2016   Procedure: Left Heart Cath and Coronary Angiography;  Surgeon: Peter M Martinique, MD;  Location: [facility];  Service: Cardiovascular;  Laterality: N/A;  . COLONOSCOPY WITH PROPOFOL N/A 08/30/2018   Procedure: COLONOSCOPY WITH PROPOFOL;  Surgeon: Carol Ada, MD;  Location: [facility];  Service: Endoscopy;  Laterality: N/A;  . EP IMPLANTABLE DEVICE N/A 10/10/2016   Procedure: ICD Implant;  Surgeon: Will Meredith Leeds, MD;  Location: [facility];  Service: Cardiovascular;  Laterality: N/A;  . LUMBAR LAMINECTOMY/DECOMPRESSION MICRODISCECTOMY N/A 01/18/2018   Procedure: L2-3, L3-4, L-4-5 CENTRAL DECOMPRESSION;  Surgeon: [name], MD;  Location: [facility];  Service: Orthopedics;  Laterality: N/A;  . POLYPECTOMY  08/30/2018   Procedure: POLYPECTOMY;  Surgeon: Carol Ada, MD;  Location: [facility];  Service: Endoscopy;;  reports that he has never smoked. He has never used smokeless tobacco. He reports that he does not drink alcohol or use drugs.  Allergies  Allergen Reactions  . Contrast Media [Iodinated Diagnostic Agents] Shortness Of Breath and Nausea And Vomiting  . Lisinopril Cough    Family History  Problem Relation Age of Onset  . Heart attack Father         died in his 26's  . Hypertension Sister   . Cancer Brother        uncertain, "leg"  . Hypertension Sister     Prior to Admission medications   Medication Sig Start Date End Date Taking? Authorizing Provider  allopurinol (ZYLOPRIM) 300 MG tablet Take 300 mg by mouth daily. 08/16/16   [provider]  amiodarone (PACERONE) 200 MG tablet Take 1 tablet (200 mg total) by mouth daily. 02/18/19   Camnitz, Ocie Doyne, MD  apixaban (ELIQUIS) 5 MG TABS tablet Take 1 tablet (5 mg total) by mouth 2 (two) times daily. 12/19/18   Camnitz, Ocie Doyne, MD  atorvastatin (LIPITOR) 40 MG tablet Take 1 tablet (40 mg total) by mouth daily at 6 PM. 10/27/16   Camnitz, Ocie Doyne, MD  carvedilol (COREG) 25 MG tablet Take 1 tablet (25 mg total) by mouth 2 (two) times daily with a meal. 05/31/18   Duke, Tami Lin, PA  [employer] VITAMIN D3 1000 units capsule Take 1,000 Units by mouth daily. 08/16/16   [provider]  empagliflozin (JARDIANCE) 25 MG TABS tablet Take 25 mg by mouth daily. 04/29/19   Lyda Jester M, PA-C  furosemide (LASIX) 40 MG tablet Take 40 mg by mouth 2 (two) times daily.    [provider]  losartan (COZAAR) 25 MG tablet Take 1 tablet (25 mg total) by mouth daily. 04/28/19   Lyda Jester M, PA-C  magnesium oxide (MAG-OX) 400 MG tablet Take 200 mg by mouth 2 (two) times daily.    [provider]  pantoprazole (PROTONIX) 40 MG tablet Take 1 tablet (40 mg total) by mouth daily. 07/01/18   Ledora Bottcher, PA    Physical Exam: Vitals:   05/05/19 2330 05/06/19 0000 05/06/19 0015 05/06/19 0126  BP: (!) 127/48 (!) 135/49 (!) 137/37 (!) 159/66  Pulse: 71 72 73 80  Resp: (!) 21 16 (!) 21   Temp:    98.4 F (36.9 C)  TempSrc:    Oral  SpO2: 98% 98% 99% 96%  Weight:    114.3 kg  Height:    5\' 6"  (1.676 m)    Physical Exam  Constitutional: He is oriented to person, place, and time. He appears well-developed and well-nourished.  HENT:  Head:  Normocephalic.  Eyes: Right eye exhibits no discharge. Left eye exhibits no discharge.  Neck: Neck supple.  Cardiovascular: Normal rate, regular rhythm and intact distal pulses.  Pulmonary/Chest: Effort normal. No respiratory distress. He has no wheezes. He has no rales.  Abdominal: Soft. Bowel sounds are normal. He exhibits no distension. There is no abdominal tenderness. There is no guarding.  Musculoskeletal:        General: No edema.  Neurological: He is alert and oriented to person, place, and time.  Skin: Skin is warm. He is diaphoretic.     Labs on Admission: I have personally reviewed following labs and imaging studies  CBC: Recent Labs  Lab 05/05/19 2045  WBC 16.6*  HGB 14.4  HCT 46.3  MCV 94.1  PLT 676   Basic Metabolic Panel:  Recent Labs  Lab 05/05/19 1920  NA 138  K 4.7  CL 102  CO2 22  GLUCOSE 193*  BUN 21  CREATININE 2.02*  CALCIUM 10.0   GFR: Estimated Creatinine Clearance: 41 mL/min (A) (by C-G formula based on SCr of 2.02 mg/dL (H)). Liver Function Tests: No results for input(s): AST, ALT, ALKPHOS, BILITOT, PROT, ALBUMIN in the last 168 hours. No results for input(s): LIPASE, AMYLASE in the last 168 hours. No results for input(s): AMMONIA in the last 168 hours. Coagulation Profile: No results for input(s): INR, PROTIME in the last 168 hours. Cardiac Enzymes: No results for input(s): CKTOTAL, CKMB, CKMBINDEX, TROPONINI in the last 168 hours. BNP (last 3 results) No results for input(s): PROBNP in the last 8760 hours. HbA1C: No results for input(s): HGBA1C in the last 72 hours. CBG: No results for input(s): GLUCAP in the last 168 hours. Lipid Profile: No results for input(s): CHOL, HDL, LDLCALC, TRIG, CHOLHDL, LDLDIRECT in the last 72 hours. Thyroid Function Tests: No results for input(s): TSH, T4TOTAL, FREET4, T3FREE, THYROIDAB in the last 72 hours. Anemia Panel: No results for input(s): VITAMINB12, FOLATE, FERRITIN, TIBC, IRON, RETICCTPCT in  the last 72 hours. Urine analysis:    Component Value Date/Time   COLORURINE YELLOW 04/06/2019 1928   APPEARANCEUR CLEAR 04/06/2019 1928   LABSPEC 1.014 04/06/2019 1928   PHURINE 6.0 04/06/2019 1928   GLUCOSEU NEGATIVE 04/06/2019 1928   HGBUR SMALL (A) 04/06/2019 1928   BILIRUBINUR NEGATIVE 04/06/2019 1928   KETONESUR 5 (A) 04/06/2019 1928   PROTEINUR 30 (A) 04/06/2019 1928   NITRITE NEGATIVE 04/06/2019 1928   LEUKOCYTESUR SMALL (A) 04/06/2019 1928    Radiological Exams on Admission: Dg Chest Port 1 View  Result Date: 05/05/2019 CLINICAL DATA:  Shortness of breath. EXAM: PORTABLE CHEST 1 VIEW COMPARISON:  04/06/2019 FINDINGS: Airspace consolidation is noted throughout most of the right lung with sparing of the right apex. Mild interstitial prominence noted lower lung on the left, stable from the prior exam. No left lung consolidation. No convincing pleural effusion.  No pneumothorax. Cardiac silhouette is mildly enlarged. No mediastinal or hilar masses. Stable left anterior chest wall AICD. IMPRESSION: 1. Right sided airspace lung opacities consistent with multifocal pneumonia. Electronically Signed   By: Lajean Manes M.D.   On: 05/05/2019 19:26    EKG: Independently reviewed.  Sinus or ectopic atrial rhythm.  LBBB, QTC 518.  No significant change since last tracing.  Assessment/Plan Principal Problem:   HCAP (healthcare-associated pneumonia) Active Problems:   AKI (acute kidney injury) ([place])   Acute respiratory failure with hypoxia ([place])   Sepsis ([place])   Acute asthma exacerbation   Sepsis secondary to multifocal pneumonia/ HCAP Initially tachycardic and tachypneic on arrival, now improved.  White count 16.6.  Lactic acid 2.9 >2.6 without patient receiving IV fluid.  COVID-19 rapid test negative.  Chest x-ray showing right-sided airspace lung opacities consistent with multifocal pneumonia.  Currently on 2 L supplemental oxygen. -Give 500 cc IV fluid bolus.  Give fluids cautiously  given history of systolic heart failure. -Received azithromycin and ceftriaxone in the ED.  Continue antibiotic coverage with Zosyn for HCAP.  Will hold off vancomycin as MRSA PCR previously negative. -Repeat lactic acid level -Blood culture x2 pending -Urinary strep antigen and Legionella -Continue to monitor white count  Acute hypoxic respiratory failure secondary to multifocal pneumonia and acute asthma exacerbation SPO2 91% on CPAP per EMS.  Initially placed on nonrebreather.  Wheezing on exam done in the ED.  Received Solu-Medrol 125 mg and albuterol treatment.  Improved and transition to 2 L supplemental oxygen. -Continuous pulse ox -Continue supplemental oxygen -Duo nebs every 6 hours -Albuterol nebulizer every 2 hours as needed -Prednisone 40 mg daily starting in a.m. -Management of pneumonia as mentioned above  AKI Creatinine 2.0, was 1.3 on 7/15.  Likely prerenal in setting of sepsis. -Gentle IV fluid hydration -Continue to monitor renal function -Monitor urine output  Elevated troponin, history of CAD Likely secondary to demand ischemia from sepsis.  High-sensitivity troponin 12> 27 >58.  EKG without acute ischemic changes.  Patient denies any chest pain and appears comfortable on exam. -Cardiac monitoring -Trend troponin  OSA -Continue CPAP at night  Well-controlled type 2 diabetes -Last A1c 6.1 on 02/27/2019.  Sliding scale insulin and CBG checks.  Paroxysmal atrial fibrillation -Continue home Eliquis.  Chronic systolic congestive heart failure Echo done July 2020 with EF 30 to 35%. No signs of volume overload at this time.  Avoid giving a diuretic in the setting of sepsis.  Unable to safely order remainder of home medications at this time as pharmacy medication reconciliation is pending.  DVT prophylaxis: Eliquis Code Status: Patient wishes to be full code. Family Communication: No family available at this time. Disposition Plan: Anticipate discharge after  clinical improvement. Consults called: None Admission status: It is my clinical opinion that referral for OBSERVATION is reasonable and necessary in this patient based on the above information provided. The aforementioned taken together are felt to place the patient at high risk for further clinical deterioration. However it is anticipated that the patient may be medically stable for discharge from the hospital within 24 to 48 hours.  The medical decision making on this patient was of high complexity and the patient is at high risk for clinical deterioration, therefore this is a level 3 visit.  [name] MD [facility] Pager [phone]  If 7PM-7AM, please contact night-coverage [email or web address] Password [email or web address]  05/06/2019, 3:30 AM

## 2019-05-05 NOTE — Progress Notes (Signed)
Pharmacy Antibiotic Note  Dean Bribiesca Sr. is a 68 y.o. male admitted on 05/05/2019 with pneumonia.  Pharmacy has been consulted for zosyn dosing.  Plan: Zosyn 3.375g IV q8h (4 hour infusion).  F/u renal function, cultures and clinical course  Height: 5\' 6"  (167.6 cm) Weight: 253 lb 8.5 oz (115 kg) IBW/kg (Calculated) : 63.8  Temp (24hrs), Avg:98 F (36.7 C), Min:98 F (36.7 C), Max:98 F (36.7 C)  Recent Labs  Lab 05/05/19 1920 05/05/19 2042 05/05/19 2045  WBC  --   --  16.6*  CREATININE 2.02*  --   --   LATICACIDVEN  --  2.9*  --     Estimated Creatinine Clearance: 41.2 mL/min (A) (by C-G formula based on SCr of 2.02 mg/dL (H)).    Allergies  Allergen Reactions  . Contrast Media [Iodinated Diagnostic Agents] Shortness Of Breath and Nausea And Vomiting  . Lisinopril Cough    Thank you for allowing pharmacy to be a part of this patient's care.  Excell Seltzer Poteet 05/05/2019 11:43 PM

## 2019-05-06 ENCOUNTER — Encounter (HOSPITAL_COMMUNITY): Payer: Self-pay | Admitting: *Deleted

## 2019-05-06 DIAGNOSIS — J45901 Unspecified asthma with (acute) exacerbation: Secondary | ICD-10-CM | POA: Diagnosis present

## 2019-05-06 DIAGNOSIS — J9801 Acute bronchospasm: Secondary | ICD-10-CM | POA: Diagnosis present

## 2019-05-06 DIAGNOSIS — N183 Chronic kidney disease, stage 3 (moderate): Secondary | ICD-10-CM | POA: Diagnosis present

## 2019-05-06 DIAGNOSIS — Z7901 Long term (current) use of anticoagulants: Secondary | ICD-10-CM | POA: Diagnosis not present

## 2019-05-06 DIAGNOSIS — R6520 Severe sepsis without septic shock: Secondary | ICD-10-CM

## 2019-05-06 DIAGNOSIS — I248 Other forms of acute ischemic heart disease: Secondary | ICD-10-CM | POA: Diagnosis present

## 2019-05-06 DIAGNOSIS — E1122 Type 2 diabetes mellitus with diabetic chronic kidney disease: Secondary | ICD-10-CM | POA: Diagnosis present

## 2019-05-06 DIAGNOSIS — Z20828 Contact with and (suspected) exposure to other viral communicable diseases: Secondary | ICD-10-CM | POA: Diagnosis present

## 2019-05-06 DIAGNOSIS — I480 Paroxysmal atrial fibrillation: Secondary | ICD-10-CM | POA: Diagnosis not present

## 2019-05-06 DIAGNOSIS — I447 Left bundle-branch block, unspecified: Secondary | ICD-10-CM | POA: Diagnosis present

## 2019-05-06 DIAGNOSIS — N179 Acute kidney failure, unspecified: Secondary | ICD-10-CM | POA: Diagnosis present

## 2019-05-06 DIAGNOSIS — M109 Gout, unspecified: Secondary | ICD-10-CM | POA: Diagnosis present

## 2019-05-06 DIAGNOSIS — R0603 Acute respiratory distress: Secondary | ICD-10-CM | POA: Diagnosis present

## 2019-05-06 DIAGNOSIS — I130 Hypertensive heart and chronic kidney disease with heart failure and stage 1 through stage 4 chronic kidney disease, or unspecified chronic kidney disease: Secondary | ICD-10-CM | POA: Diagnosis present

## 2019-05-06 DIAGNOSIS — J189 Pneumonia, unspecified organism: Secondary | ICD-10-CM | POA: Diagnosis present

## 2019-05-06 DIAGNOSIS — Y95 Nosocomial condition: Secondary | ICD-10-CM | POA: Diagnosis present

## 2019-05-06 DIAGNOSIS — I2510 Atherosclerotic heart disease of native coronary artery without angina pectoris: Secondary | ICD-10-CM | POA: Diagnosis present

## 2019-05-06 DIAGNOSIS — J683 Other acute and subacute respiratory conditions due to chemicals, gases, fumes and vapors: Secondary | ICD-10-CM | POA: Diagnosis present

## 2019-05-06 DIAGNOSIS — Z95810 Presence of automatic (implantable) cardiac defibrillator: Secondary | ICD-10-CM | POA: Diagnosis not present

## 2019-05-06 DIAGNOSIS — Y92002 Bathroom of unspecified non-institutional (private) residence single-family (private) house as the place of occurrence of the external cause: Secondary | ICD-10-CM | POA: Diagnosis not present

## 2019-05-06 DIAGNOSIS — G4733 Obstructive sleep apnea (adult) (pediatric): Secondary | ICD-10-CM | POA: Diagnosis present

## 2019-05-06 DIAGNOSIS — Z8701 Personal history of pneumonia (recurrent): Secondary | ICD-10-CM | POA: Diagnosis not present

## 2019-05-06 DIAGNOSIS — T5491XA Toxic effect of unspecified corrosive substance, accidental (unintentional), initial encounter: Secondary | ICD-10-CM | POA: Diagnosis present

## 2019-05-06 DIAGNOSIS — Z79899 Other long term (current) drug therapy: Secondary | ICD-10-CM | POA: Diagnosis not present

## 2019-05-06 DIAGNOSIS — A419 Sepsis, unspecified organism: Secondary | ICD-10-CM

## 2019-05-06 DIAGNOSIS — I255 Ischemic cardiomyopathy: Secondary | ICD-10-CM | POA: Diagnosis present

## 2019-05-06 DIAGNOSIS — J9601 Acute respiratory failure with hypoxia: Secondary | ICD-10-CM | POA: Diagnosis present

## 2019-05-06 DIAGNOSIS — I5022 Chronic systolic (congestive) heart failure: Secondary | ICD-10-CM | POA: Diagnosis present

## 2019-05-06 DIAGNOSIS — E785 Hyperlipidemia, unspecified: Secondary | ICD-10-CM | POA: Diagnosis present

## 2019-05-06 LAB — GLUCOSE, CAPILLARY
Glucose-Capillary: 142 mg/dL — ABNORMAL HIGH (ref 70–99)
Glucose-Capillary: 110 mg/dL — ABNORMAL HIGH (ref 70–99)
Glucose-Capillary: 121 mg/dL — ABNORMAL HIGH (ref 70–99)
Glucose-Capillary: 136 mg/dL — ABNORMAL HIGH (ref 70–99)

## 2019-05-06 LAB — CBC
HCT: 41.5 % (ref 39.0–52.0)
Hemoglobin: 12.8 g/dL — ABNORMAL LOW (ref 13.0–17.0)
MCH: 28.7 pg (ref 26.0–34.0)
MCHC: 30.8 g/dL (ref 30.0–36.0)
MCV: 93 fL (ref 80.0–100.0)
Platelets: 158 10*3/uL (ref 150–400)
RBC: 4.46 MIL/uL (ref 4.22–5.81)
RDW: 14.5 % (ref 11.5–15.5)
WBC: 12.8 10*3/uL — ABNORMAL HIGH (ref 4.0–10.5)
nRBC: 0 % (ref 0.0–0.2)

## 2019-05-06 LAB — BASIC METABOLIC PANEL
Anion gap: 11 (ref 5–15)
BUN: 17 mg/dL (ref 8–23)
CO2: 25 mmol/L (ref 22–32)
Calcium: 9.7 mg/dL (ref 8.9–10.3)
Chloride: 103 mmol/L (ref 98–111)
Creatinine, Ser: 1.67 mg/dL — ABNORMAL HIGH (ref 0.61–1.24)
GFR calc Af Amer: 48 mL/min — ABNORMAL LOW (ref >60)
GFR calc non Af Amer: 41 mL/min — ABNORMAL LOW (ref >60)
Glucose, Bld: 141 mg/dL — ABNORMAL HIGH (ref 70–99)
Potassium: 4.6 mmol/L (ref 3.5–5.1)
Sodium: 139 mmol/L (ref 135–145)

## 2019-05-06 LAB — LACTIC ACID, PLASMA
Lactic Acid, Venous: 2.6 mmol/L (ref 0.5–1.9)
Lactic Acid, Venous: 1.5 mmol/L (ref 0.5–1.9)

## 2019-05-06 LAB — TROPONIN I (HIGH SENSITIVITY)
Troponin I (High Sensitivity): 58 ng/L — ABNORMAL HIGH (ref <18)
Troponin I (High Sensitivity): 64 ng/L — ABNORMAL HIGH (ref <18)

## 2019-05-06 LAB — PROCALCITONIN: Procalcitonin: 1.31 ng/mL

## 2019-05-06 LAB — STREP PNEUMONIAE URINARY ANTIGEN: Strep Pneumo Urinary Antigen: NEGATIVE

## 2019-05-06 MED ORDER — PREDNISONE 20 MG PO TABS
40.0000 mg | ORAL_TABLET | Freq: Every day | ORAL | Status: DC
  Administered 2019-05-06 – 2019-05-08 (×3): 40 mg via ORAL
  Filled 2019-05-06 (×4): qty 2

## 2019-05-06 MED ORDER — MAGNESIUM OXIDE 400 (241.3 MG) MG PO TABS
200.0000 mg | ORAL_TABLET | Freq: Two times a day (BID) | ORAL | Status: DC
  Administered 2019-05-06 – 2019-05-08 (×5): 200 mg via ORAL
  Filled 2019-05-06: qty 1
  Filled 2019-05-06: qty 0.5
  Filled 2019-05-06 (×3): qty 1
  Filled 2019-05-06: qty 0.5
  Filled 2019-05-06: qty 1

## 2019-05-06 MED ORDER — CARVEDILOL 25 MG PO TABS
25.0000 mg | ORAL_TABLET | Freq: Two times a day (BID) | ORAL | Status: DC
  Administered 2019-05-06 – 2019-05-08 (×4): 25 mg via ORAL
  Filled 2019-05-06 (×5): qty 1

## 2019-05-06 MED ORDER — AMIODARONE HCL 200 MG PO TABS
200.0000 mg | ORAL_TABLET | Freq: Every day | ORAL | Status: DC
  Administered 2019-05-06 – 2019-05-08 (×3): 200 mg via ORAL
  Filled 2019-05-06 (×3): qty 1

## 2019-05-06 MED ORDER — ALLOPURINOL 300 MG PO TABS
300.0000 mg | ORAL_TABLET | Freq: Every day | ORAL | Status: DC
  Administered 2019-05-06 – 2019-05-08 (×3): 300 mg via ORAL
  Filled 2019-05-06 (×3): qty 1

## 2019-05-06 MED ORDER — PANTOPRAZOLE SODIUM 40 MG PO TBEC
40.0000 mg | DELAYED_RELEASE_TABLET | Freq: Every day | ORAL | Status: DC
  Administered 2019-05-06 – 2019-05-08 (×3): 40 mg via ORAL
  Filled 2019-05-06 (×3): qty 1

## 2019-05-06 MED ORDER — FUROSEMIDE 40 MG PO TABS
40.0000 mg | ORAL_TABLET | Freq: Two times a day (BID) | ORAL | Status: DC
  Administered 2019-05-06 – 2019-05-08 (×5): 40 mg via ORAL
  Filled 2019-05-06 (×5): qty 1

## 2019-05-06 MED ORDER — LOSARTAN POTASSIUM 25 MG PO TABS
25.0000 mg | ORAL_TABLET | Freq: Every day | ORAL | Status: DC
  Administered 2019-05-06 – 2019-05-08 (×3): 25 mg via ORAL
  Filled 2019-05-06 (×3): qty 1

## 2019-05-06 MED ORDER — IPRATROPIUM-ALBUTEROL 0.5-2.5 (3) MG/3ML IN SOLN
3.0000 mL | Freq: Four times a day (QID) | RESPIRATORY_TRACT | Status: DC | PRN
  Administered 2019-05-06: 3 mL via RESPIRATORY_TRACT
  Filled 2019-05-06: qty 3

## 2019-05-06 MED ORDER — ATORVASTATIN CALCIUM 40 MG PO TABS
40.0000 mg | ORAL_TABLET | Freq: Every day | ORAL | Status: DC
  Administered 2019-05-06 – 2019-05-07 (×2): 40 mg via ORAL
  Filled 2019-05-06 (×3): qty 1

## 2019-05-06 NOTE — Plan of Care (Signed)

## 2019-05-06 NOTE — Progress Notes (Signed)
PROGRESS NOTE  Dean Bones Sr. AYT:[id number] DOB: 1951-07-14 DOA: 05/05/2019 PCP: [name], MD  Brief History   Dean Mcburney Sr. is a 68 y.o. 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 Patient states his toilet was clogged so his daughter poured Drano into it.  The toilet continue to be clogged so then he put some bleach in it which caused him to inhale fumes.  He then started feeling short of breath and coughing.  States prior to this he was feeling well the last few days and was not having any shortness of breath, cough, or chest pain.  Denies orthopnea or lower extremity edema.  Denies fevers, chills, or fatigue.  He continues to takes Lasix twice a day.  Denies any nausea, vomiting, abdominal pain, or diarrhea.  No other complaints.  Patient was admitted 7/12 to 7/15 for acute hypoxic respiratory failure secondary to multifocal pneumonia.  He was treated with cefepime during his hospitalization and discharged home on oral Augmentin to complete a 5-day course of antibiotics.  ED Course: Afebrile.  Initially tachycardic and tachypneic on arrival, now improved.  Not hypotensive.  White count 16.6.  Lactic acid 2.9.  Creatinine 2.0, was 1.3 on 04/09/2019.  BNP 329.  High-sensitivity troponin 12 >27.  EKG without acute ischemic changes.  COVID-19 rapid test negative.  Blood culture x2 pending.  Chest x-ray showing right-sided airspace lung opacities consistent with multifocal pneumonia. Wheezing on exam.  Patient received albuterol, Solu-Medrol 125 mg, azithromycin, and ceftriaxone.  Consultants  . None  Procedures  . None  Antibiotics   Anti-infectives (From admission, onward)   Start     Dose/Rate Route  Frequency Ordered Stop   05/06/19 0800  piperacillin-tazobactam (ZOSYN) IVPB 3.375 g     3.375 g 12.5 mL/hr over 240 Minutes Intravenous Every 8 hours 05/05/19 2341     05/05/19 2345  piperacillin-tazobactam (ZOSYN) IVPB 3.375 g     3.375 g 100 mL/hr over 30 Minutes Intravenous  Once 05/05/19 2341 05/06/19 0234   05/05/19 1945  cefTRIAXone (ROCEPHIN) 1 g in sodium chloride 0.9 % 100 mL IVPB     1 g 200 mL/hr over 30 Minutes Intravenous  Once 05/05/19 1935 05/05/19 2255   05/05/19 1945  azithromycin (ZITHROMAX) 500 mg in sodium chloride 0.9 % 250 mL IVPB     500 mg 250 mL/hr over 60 Minutes Intravenous  Once 05/05/19 1935 05/05/19 2325    .  [name]   Subjective  The patient states that he is feeling better. No fevers, no sputum production, no chest pain.  Objective   Vitals:  Vitals:   05/06/19 1018 05/06/19 1301  BP: (!) 145/50   Pulse: (!) 58   Resp:    Temp:    SpO2: 94% 100%    Exam:  Constitutional:  . The patient is awake, alert, and oriented x 3. No acute distress. Respiratory:  . No increased work of breathing . Small wheezes bilaterally. No rales or rhonchi . No tactile fremitus Cardiovascular:  . Regular rate and rhythm. . No murmurs, ectopy, or gallups. . No lateral PMI. No thrills. Abdomen:  . Abdomen is soft, non-tender, non-distended. . No hernias, masses, or organomegaly. . Normoactive bowel sounds.  Musculoskeletal:  . Non  cyanosis, clubbing, or edema Skin:  . No rashes, lesions, ulcers . palpation of skin: no induration or nodules Neurologic:  . CN 2-12 intact . Sensation all 4 extremities intact Psychiatric:  . Mental status o Mood, affect appropriate o Orientation to person, place, time  . judgment and insight appear intact    I have personally reviewed the following:   Today's Data  . Vitals, Procalcitonin, CBC, BMP  Imaging  . CXR  Scheduled Meds: . allopurinol  300 mg Oral Daily  . amiodarone  200 mg Oral Daily  . apixaban  5 mg  Oral BID  . atorvastatin  40 mg Oral q1800  . carvedilol  25 mg Oral BID WC  . furosemide  40 mg Oral BID  . insulin aspart  0-5 Units Subcutaneous QHS  . insulin aspart  0-9 Units Subcutaneous TID WC  . losartan  25 mg Oral Daily  . magnesium oxide  200 mg Oral BID  . pantoprazole  40 mg Oral Daily  . predniSONE  40 mg Oral Q breakfast   Continuous Infusions: . piperacillin-tazobactam (ZOSYN)  IV 12.5 mL/hr at 05/06/19 1233    Principal Problem:   HCAP (healthcare-associated pneumonia) Active Problems:   AKI (acute kidney injury) ([place])   Acute respiratory failure with hypoxia ([place])   Sepsis ([place])   Acute asthma exacerbation   LOS: 0 days   A & P   Acute hypoxic respiratory failure: Resolving. The patient is currently saturating at 100% on 2L by nasal cannula. Wean as able. The patient is receiving supplemental oxygen nebulizer treatments, steroids, and IV antibiotics.   HCAP: Patient was inpatient 04/06/2019. He returns with increased shortness of breath, leukocytosis, and evidnece of a multifocal pneumonia on the right. Procalcitonin is positive at 1.2. He is receiving IV zosyn.   Sepsis: Initially based upon the patient's tachycardia, tachypnea, leukocytosis, lactic acid, hypoxia, and right sided airspace disease. Resolving with IV fluids and IV antibiotics. Blood cultures drawn and have had no growth.  AKI: Initial creatinine was 2.02. This morning down to 1.67. Baseline creatinine appears to be 1.34 - 1.4.  Acute asthma exacerbation due to inhalational injury: Nebulizer treatments and steroids. Due to the patient putting bleach in a toilet that had been treated with drain cleaner. Pt states that he immediately had difficulty breathing.  I have seen and examined this patient myself. I have spent 36 minutes in his evaluation and care.  DVT prophylaxis: Eliquis Code Status: Full Code Family Communication: None available. Disposition Plan: Home.   Dean Nephew, DO [facility] Direct contact: see [email or web address]  7PM-7AM contact night coverage as above 05/06/2019, 1:48 PM  LOS: 0 days

## 2019-05-06 NOTE — ED Notes (Signed)
ED TO INPATIENT HANDOFF REPORT  ED Nurse Name and Phone #: Caprice Kluver 1951  S Name/Age/Gender Dean Bones Sr. 68 y.o. male Room/Bed: 033C/033C  Code Status   Code Status: Full Code  Home/SNF/Other Home Patient oriented to: self, place, time and situation Is this baseline? Yes   Triage Complete: Triage complete  Chief Complaint SOB after chemical exposure  Triage Note Pt here from home via [facility] for respiratory distress after he mixed sulfuric acid and draino to clean his toilet.  Pt was on his C-pap when ems arrived with sats of 91%.  On way to hosp pt became increasingly tachypneic.  Now on nrb - labored.   Allergies Allergies  Allergen Reactions  . Contrast Media [Iodinated Diagnostic Agents] Shortness Of Breath and Nausea And Vomiting  . Lisinopril Cough    Level of Care/Admitting Diagnosis ED Disposition    ED Disposition Condition [facility]: [place] [[id number]]  Level of Care: Telemetry Medical [104]  I expect the patient will be discharged within 24 hours: Yes  LOW acuity---Tx typically complete <24 hrs---ACUTE conditions typically can be evaluated <24 hours---LABS likely to return to acceptable levels <24 hours---IS near functional baseline---EXPECTED to return to current living arrangement---NOT newly hypoxic: Meets criteria for 5C-Observation unit  Covid Evaluation: Confirmed COVID Negative  Diagnosis: HCAP (healthcare-associated pneumonia) [932671]  Admitting Physician: Dean [name] [[id number]]  Attending Physician: Dean [name] [[id number]]  PT Class (Do Not Modify): Observation [104]  PT Acc Code (Do Not Modify): Observation [10022]       B Medical/Surgery History Past Medical History:  Diagnosis Date  . 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 Arthritis    "maybe in his spine" (09/05/2016)  . Asthma    pt. denies  . Cardiac arrest ([place]) 08/27/2016   REQUIRING CPR, SHOCK &  MEDICATIONS  . CHF (congestive heart failure) ([place])   . Coronary artery disease    90% ostRCA stenosis and total occlusion of dRCA with L-->R collaterals 2017  . Dysrhythmia   . Gout   . High cholesterol   . Hypertension   . Ischemic cardiomyopathy   . LBBB (left bundle branch block)   . OSA on CPAP   . Type II diabetes mellitus ([place])    Past Surgical History:  Procedure Laterality Date  . CARDIAC CATHETERIZATION N/A 09/07/2016   Procedure: Left Heart Cath and Coronary Angiography;  Surgeon: Peter M Martinique, MD;  Location: [facility];  Service: Cardiovascular;  Laterality: N/A;  . COLONOSCOPY WITH PROPOFOL N/A 08/30/2018   Procedure: COLONOSCOPY WITH PROPOFOL;  Surgeon: Carol Ada, MD;  Location: [facility];  Service: Endoscopy;  Laterality: N/A;  . EP IMPLANTABLE DEVICE N/A 10/10/2016   Procedure: ICD Implant;  Surgeon: Will Meredith Leeds, MD;  Location: [facility];  Service: Cardiovascular;  Laterality: N/A;  . LUMBAR LAMINECTOMY/DECOMPRESSION MICRODISCECTOMY N/A 01/18/2018   Procedure: L2-3, L3-4, L-4-5 CENTRAL DECOMPRESSION;  Surgeon: [name], MD;  Location: [facility];  Service: Orthopedics;  Laterality: N/A;  . POLYPECTOMY  08/30/2018   Procedure: POLYPECTOMY;  Surgeon: Carol Ada, MD;  Location: [facility];  Service: Endoscopy;;     A IV Location/Drains/Wounds Patient Lines/Drains/Airways Status   Active Line/Drains/Airways    Name:   Placement date:   Placement time:   Site:   Days:   Peripheral IV 05/05/19 Left;Posterior Forearm   05/05/19    2137    Forearm   1   Incision (Closed) 01/18/18  Back Other (Comment)   01/18/18    1152     473          Intake/Output Last 24 hours  Intake/Output Summary (Last 24 hours) at 05/06/2019 0028 Last data filed at 05/05/2019 2325 Gross per 24 hour  Intake 350 ml  Output -  Net 350 ml    Labs/Imaging Results for orders placed or performed during the hospital encounter of 05/05/19 (from the past 48  hour(s))  Basic metabolic panel     Status: Abnormal   Collection Time: 05/05/19  7:20 PM  Result Value Ref Range   Sodium 138 135 - 145 mmol/L   Potassium 4.7 3.5 - 5.1 mmol/L    Comment: HEMOLYSIS AT THIS LEVEL MAY AFFECT RESULT   Chloride 102 98 - 111 mmol/L   CO2 22 22 - 32 mmol/L   Glucose, Bld 193 (H) 70 - 99 mg/dL   BUN 21 8 - 23 mg/dL   Creatinine, Ser 2.02 (H) 0.61 - 1.24 mg/dL   Calcium 10.0 8.9 - 10.3 mg/dL   GFR calc non Af Amer 33 (L) >60 mL/min   GFR calc Af Amer 38 (L) >60 mL/min   Anion gap 14 5 - 15    Comment: Performed at [facility], [address], [place], [place] [address]  Brain natriuretic peptide     Status: Abnormal   Collection Time: 05/05/19  7:20 PM  Result Value Ref Range   B Natriuretic Peptide 329.5 (H) 0.0 - 100.0 pg/mL    Comment: Performed at [facility] [address], [place], [name] [address]  Troponin I (High Sensitivity)     Status: None   Collection Time: 05/05/19  7:20 PM  Result Value Ref Range   Troponin I (High Sensitivity) 12 <18 ng/L    Comment: (NOTE) Elevated high sensitivity troponin I (hsTnI) values and significant  changes across serial measurements may suggest ACS but many other  chronic and acute conditions are known to elevate hsTnI results.  Refer to the "Links" section for chest pain algorithms and additional  guidance. Performed at [facility], [place] [address], [place], [place] [address]   SARS Coronavirus 2 [facility] order, Performed in [facility] hospital lab) Nasopharyngeal Nasopharyngeal Swab     Status: None   Collection Time: 05/05/19  8:25 PM   Specimen: Nasopharyngeal Swab  Result Value Ref Range   SARS Coronavirus 2 NEGATIVE NEGATIVE    Comment: (NOTE) If result is NEGATIVE SARS-CoV-2 target nucleic acids are NOT DETECTED. The SARS-CoV-2 RNA is generally detectable in upper and lower  respiratory specimens during the acute phase of infection. The lowest  concentration of SARS-CoV-2  viral copies this assay can detect is 250  copies / mL. A negative result does not preclude SARS-CoV-2 infection  and should not be used as the sole basis for treatment or other  patient management decisions.  A negative result may occur with  improper specimen collection / handling, submission of specimen other  than nasopharyngeal swab, presence of viral mutation(s) within the  areas targeted by this assay, and inadequate number of viral copies  (<250 copies / mL). A negative result must be combined with clinical  observations, patient history, and epidemiological information. If result is POSITIVE SARS-CoV-2 target nucleic acids are DETECTED. The SARS-CoV-2 RNA is generally detectable in upper and lower  respiratory specimens dur ing the acute phase of infection.  Positive  results are indicative of active infection  with SARS-CoV-2.  Clinical  correlation with patient history and other diagnostic information is  necessary to determine patient infection status.  Positive results do  not rule out bacterial infection or co-infection with other viruses. If result is PRESUMPTIVE POSTIVE SARS-CoV-2 nucleic acids MAY BE PRESENT.   A presumptive positive result was obtained on the submitted specimen  and confirmed on repeat testing.  While 2019 novel coronavirus  (SARS-CoV-2) nucleic acids may be present in the submitted sample  additional confirmatory testing may be necessary for epidemiological  and / or clinical management purposes  to differentiate between  SARS-CoV-2 and other Sarbecovirus currently known to infect humans.  If clinically indicated additional testing with an alternate test  methodology [phone]) is advised. The SARS-CoV-2 RNA is generally  detectable in upper and lower respiratory sp ecimens during the acute  phase of infection. The expected result is Negative. Fact Sheet for Patients:  StrictlyIdeas.no Fact Sheet for Healthcare  Providers: BankingDealers.co.za This test is not yet approved or cleared by the [place] FDA and has been authorized for detection and/or diagnosis of SARS-CoV-2 by FDA under an Emergency Use Authorization (EUA).  This EUA will remain in effect (meaning this test can be used) for the duration of the COVID-19 declaration under Section 564(b)(1) of the Act, 21 U.S.C. section 360bbb-3(b)(1), unless the authorization is terminated or revoked sooner. Performed at [facility], [place] [address] [place], [name] [address]   Lactic acid, plasma     Status: Abnormal   Collection Time: 05/05/19  8:42 PM  Result Value Ref Range   Lactic Acid, Venous 2.9 (HH) 0.5 - 1.9 mmol/L    Comment: CRITICAL RESULT CALLED TO, READ BACK BY AND VERIFIED WITH: S Keona Bilyeu RN 05/05/2019 AT 2152 BY H SOEWARDIMAN Performed at [facility], [place] [address] [place], [name] [address]   Troponin I (High Sensitivity)     Status: Abnormal   Collection Time: 05/05/19  8:45 PM  Result Value Ref Range   Troponin I (High Sensitivity) 27 (H) <18 ng/L    Comment: (NOTE) Elevated high sensitivity troponin I (hsTnI) values and significant  changes across serial measurements may suggest ACS but many other  chronic and acute conditions are known to elevate hsTnI results.  Refer to the "Links" section for chest pain algorithms and additional  guidance. Performed at [facility], [place] [address], [place], [place] [address]   CBC     Status: Abnormal   Collection Time: 05/05/19  8:45 PM  Result Value Ref Range   WBC 16.6 (H) 4.0 - 10.5 K/uL   RBC 4.92 4.22 - 5.81 MIL/uL   Hemoglobin 14.4 13.0 - 17.0 g/dL   HCT 46.3 39.0 - 52.0 %   MCV 94.1 80.0 - 100.0 fL   MCH 29.3 26.0 - 34.0 pg   MCHC 31.1 30.0 - 36.0 g/dL   RDW 14.5 11.5 - 15.5 %   Platelets 197 150 - 400 K/uL   nRBC 0.0 0.0 - 0.2 %    Comment: Performed at [facility], [place] [address] [place],  [address]   Dg  Chest Port 1 View  Result Date: 05/05/2019 CLINICAL DATA:  Shortness of breath. EXAM: PORTABLE CHEST 1 VIEW COMPARISON:  04/06/2019 FINDINGS: Airspace consolidation is noted throughout most of the right lung with sparing of the right apex. Mild interstitial prominence noted lower lung on the left, stable from the prior exam. No left lung consolidation. No convincing pleural  effusion.  No pneumothorax. Cardiac silhouette is mildly enlarged. No mediastinal or hilar masses. Stable left anterior chest wall AICD. IMPRESSION: 1. Right sided airspace lung opacities consistent with multifocal pneumonia. Electronically Signed   By: Lajean Manes M.D.   On: 05/05/2019 19:26    Pending Labs Unresulted Labs (From admission, onward)    Start     Ordered   05/06/19 8315  Basic metabolic panel  Tomorrow morning,   R     05/05/19 2329   05/06/19 0500  CBC  Tomorrow morning,   R     05/05/19 2329   05/05/19 2324  Lactic acid, plasma  ONCE - STAT,   STAT    Comments: Collect after IV fluid bolus    05/05/19 2328   05/05/19 1935  Blood culture (routine x 2)  BLOOD CULTURE X 2,   STAT     05/05/19 1935          Vitals/Pain Today's Vitals   05/05/19 2315 05/05/19 2330 05/06/19 0000 05/06/19 0015  BP: (!) 125/49 (!) 127/48 (!) 135/49 (!) 137/37  Pulse: 74 71 72 73  Resp: 16 (!) 21 16 (!) 21  Temp:      SpO2: 100% 98% 98% 99%  Weight:      Height:        Isolation Precautions No active isolations  Medications Medications  sodium chloride 0.9 % bolus 500 mL (has no administration in time range)  apixaban (ELIQUIS) tablet 5 mg (has no administration in time range)  acetaminophen (TYLENOL) tablet 650 mg (has no administration in time range)    Or  acetaminophen (TYLENOL) suppository 650 mg (has no administration in time range)  insulin aspart (novoLOG) injection 0-9 Units (has no administration in time range)  insulin aspart (novoLOG) injection 0-5 Units (has no administration in time range)   ipratropium-albuterol (DUONEB) 0.5-2.5 (3) MG/3ML nebulizer solution 3 mL (has no administration in time range)  albuterol (PROVENTIL) (2.5 MG/3ML) 0.083% nebulizer solution 2.5 mg (has no administration in time range)  piperacillin-tazobactam (ZOSYN) IVPB 3.375 g (has no administration in time range)  piperacillin-tazobactam (ZOSYN) IVPB 3.375 g (has no administration in time range)  albuterol (VENTOLIN HFA) 108 (90 Base) MCG/ACT inhaler 4 puff (4 puffs Inhalation Given 05/05/19 1958)  methylPREDNISolone sodium succinate (SOLU-MEDROL) 125 mg/2 mL injection 125 mg (125 mg Intravenous Given 05/05/19 2210)  cefTRIAXone (ROCEPHIN) 1 g in sodium chloride 0.9 % 100 mL IVPB (0 g Intravenous Stopped 05/05/19 2255)  azithromycin (ZITHROMAX) 500 mg in sodium chloride 0.9 % 250 mL IVPB (0 mg Intravenous Stopped 05/05/19 2325)  AeroChamber Plus Flo-Vu Large MISC (1 each  Given 05/05/19 1958)    Mobility walks     Focused Assessments NA   R Recommendations: See Admitting Provider Note  Report given to:   Additional Notes:

## 2019-05-06 NOTE — Progress Notes (Signed)
Lactic Acid now 2.6, down from 2.9 previously. Will make primary RN aware.

## 2019-05-07 ENCOUNTER — Ambulatory Visit (HOSPITAL_COMMUNITY): Payer: Medicare Other

## 2019-05-07 ENCOUNTER — Encounter (HOSPITAL_COMMUNITY): Payer: Self-pay

## 2019-05-07 ENCOUNTER — Other Ambulatory Visit: Payer: Medicare Other

## 2019-05-07 LAB — LEGIONELLA PNEUMOPHILA SEROGP 1 UR AG: L. pneumophila Serogp 1 Ur Ag: NEGATIVE

## 2019-05-07 LAB — CBC WITH DIFFERENTIAL/PLATELET
Abs Immature Granulocytes: 0.2 10*3/uL — ABNORMAL HIGH (ref 0.00–0.07)
Basophils Absolute: 0 10*3/uL (ref 0.0–0.1)
Basophils Relative: 0 %
Eosinophils Absolute: 0 10*3/uL (ref 0.0–0.5)
Eosinophils Relative: 0 %
HCT: 38.7 % — ABNORMAL LOW (ref 39.0–52.0)
Hemoglobin: 12.3 g/dL — ABNORMAL LOW (ref 13.0–17.0)
Immature Granulocytes: 1 %
Lymphocytes Relative: 7 %
Lymphs Abs: 1.5 10*3/uL (ref 0.7–4.0)
MCH: 29.1 pg (ref 26.0–34.0)
MCHC: 31.8 g/dL (ref 30.0–36.0)
MCV: 91.5 fL (ref 80.0–100.0)
Monocytes Absolute: 0.9 10*3/uL (ref 0.1–1.0)
Monocytes Relative: 5 %
Neutro Abs: 17.7 10*3/uL — ABNORMAL HIGH (ref 1.7–7.7)
Neutrophils Relative %: 87 %
Platelets: 172 10*3/uL (ref 150–400)
RBC: 4.23 MIL/uL (ref 4.22–5.81)
RDW: 14.4 % (ref 11.5–15.5)
WBC: 20.4 10*3/uL — ABNORMAL HIGH (ref 4.0–10.5)
nRBC: 0 % (ref 0.0–0.2)

## 2019-05-07 LAB — BASIC METABOLIC PANEL
Anion gap: 10 (ref 5–15)
BUN: 27 mg/dL — ABNORMAL HIGH (ref 8–23)
CO2: 25 mmol/L (ref 22–32)
Calcium: 9.7 mg/dL (ref 8.9–10.3)
Chloride: 100 mmol/L (ref 98–111)
Creatinine, Ser: 1.74 mg/dL — ABNORMAL HIGH (ref 0.61–1.24)
GFR calc Af Amer: 45 mL/min — ABNORMAL LOW (ref >60)
GFR calc non Af Amer: 39 mL/min — ABNORMAL LOW (ref >60)
Glucose, Bld: 135 mg/dL — ABNORMAL HIGH (ref 70–99)
Potassium: 4.4 mmol/L (ref 3.5–5.1)
Sodium: 135 mmol/L (ref 135–145)

## 2019-05-07 LAB — PROCALCITONIN: Procalcitonin: 1.65 ng/mL

## 2019-05-07 LAB — GLUCOSE, CAPILLARY
Glucose-Capillary: 145 mg/dL — ABNORMAL HIGH (ref 70–99)
Glucose-Capillary: 135 mg/dL — ABNORMAL HIGH (ref 70–99)
Glucose-Capillary: 122 mg/dL — ABNORMAL HIGH (ref 70–99)
Glucose-Capillary: 112 mg/dL — ABNORMAL HIGH (ref 70–99)

## 2019-05-07 MED ORDER — AMOXICILLIN-POT CLAVULANATE 875-125 MG PO TABS: 1.0000 | ORAL_TABLET | Freq: Two times a day (BID) | ORAL | 0 refills | Status: AC

## 2019-05-07 MED ORDER — PREDNISONE 20 MG PO TABS: ORAL_TABLET | ORAL | 0 refills | Status: AC

## 2019-05-07 NOTE — Progress Notes (Signed)
CCMD notified this nurse of pt's HR sustaining in the 40's. Woke pt up and pt is asymptomatic. Now HR is sustaining in the 60's while awake and sitting on side of bed. Paged MD on call.

## 2019-05-07 NOTE — Progress Notes (Signed)
Patient states he will put himself on CPAP when he is ready.

## 2019-05-07 NOTE — Plan of Care (Signed)
  Problem: Education: Goal: Knowledge of General Education information will improve Description: Including pain rating scale, medication(s)/side effects and non-pharmacologic comfort measures Outcome: Adequate for Discharge   Problem: Health Behavior/Discharge Planning: Goal: Ability to manage health-related needs will improve Outcome: Adequate for Discharge   Problem: Clinical Measurements: Goal: Ability to maintain clinical measurements within normal limits will improve Outcome: Adequate for Discharge Goal: Will remain free from infection Outcome: Adequate for Discharge Goal: Diagnostic test results will improve Outcome: Adequate for Discharge Goal: Respiratory complications will improve Outcome: Adequate for Discharge Goal: Cardiovascular complication will be avoided Outcome: Adequate for Discharge   Problem: Activity: Goal: Risk for activity intolerance will decrease Outcome: Adequate for Discharge   Problem: Nutrition: Goal: Adequate nutrition will be maintained Outcome: Adequate for Discharge   Problem: Nutrition: Goal: Adequate nutrition will be maintained Outcome: Adequate for Discharge   Problem: Coping: Goal: Level of anxiety will decrease Outcome: Adequate for Discharge   Problem: Elimination: Goal: Will not experience complications related to bowel motility Outcome: Adequate for Discharge Goal: Will not experience complications related to urinary retention Outcome: Adequate for Discharge   Problem: Elimination: Goal: Will not experience complications related to bowel motility Outcome: Adequate for Discharge Goal: Will not experience complications related to urinary retention Outcome: Adequate for Discharge   Problem: Pain Managment: Goal: General experience of comfort will improve Outcome: Adequate for Discharge   Problem: Safety: Goal: Ability to remain free from injury will improve Outcome: Adequate for Discharge   Problem: Skin Integrity: Goal:  Risk for impaired skin integrity will decrease Outcome: Adequate for Discharge   Problem: Skin Integrity: Goal: Risk for impaired skin integrity will decrease Outcome: Adequate for Discharge

## 2019-05-07 NOTE — Discharge Summary (Addendum)
Physician Discharge Summary  Dean Bones Sr. OIN:[id number] DOB: [birth year] DOA: 05/05/2019  PCP: [name], MD  Admit date: 05/05/2019 Discharge date: 05/07/2019  Recommendations for Outpatient Follow-up:  1. The patient is to follow up with PCP in 7-10 days.  Follow-up Information    [name], MD.   Specialty: Internal Medicine Contact information: [phone] [place] [address] [phone]          Discharge Diagnoses: Principal diagnosis is #1 1. Acute hypoxic respiratory failure 2. HCAP 3. Sepsis  4. AKI on CKD III 5. Acute asthma exacerbation due to inhalational injury.  Discharge Condition: Good Disposition: Home  Diet recommendation: Heart healthy  Filed Weights   05/05/19 1846 05/06/19 0126 05/07/19 0013  Weight: 115 kg 114.3 kg 116.5 kg    History of present illness:   Dean Calvert Sr. is a 68 y.o. 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 Patient states his toilet was clogged so his daughter poured Drano into it.  The toilet continue to be clogged so then he put some bleach in it which caused him to inhale fumes.  He then started feeling short of breath and coughing.  States prior to this he was feeling well the last few days and was not having any shortness of breath, cough, or chest pain.  Denies orthopnea or lower extremity edema.  Denies fevers, chills, or fatigue.  He continues to takes Lasix twice a day.  Denies any nausea, vomiting, abdominal pain, or diarrhea.  No other complaints.  Patient was admitted 7/12 to 7/15 for acute hypoxic respiratory failure secondary to multifocal pneumonia.  He was treated with cefepime during his hospitalization and  discharged home on oral Augmentin to complete a 5-day course of antibiotics.  ED Course: Afebrile.  Initially tachycardic and tachypneic on arrival, now improved.  Not hypotensive.  White count 16.6.  Lactic acid 2.9.  Creatinine 2.0, was 1.3 on 04/09/2019.  BNP 329.  High-sensitivity troponin 12 >27.  EKG without acute ischemic changes.  COVID-19 rapid test negative.  Blood culture x2 pending.  Chest x-ray showing right-sided airspace lung opacities consistent with multifocal pneumonia. Wheezing on exam.  Patient received albuterol, Solu-Medrol 125 mg, azithromycin, and ceftriaxone.  Hospital Course:  The patient has been admitted to a medical bed. He received steroids and nebulizer treatments as well as IV antibiotics. He is now saturating in the 90's on room air both at rest and with ambulation. He is appropriate for discharge to home.  Today's assessment: S: The patient is resting comfortably. No new complaints. O: Vitals:  Vitals:   05/07/19 0814 05/07/19 1248  BP: (!) 138/46 (!) 132/44  Pulse: 65 60  Resp: 19 18  Temp: (!) 97.4 F (36.3 C) 97.6 F (36.4 C)  SpO2: 100% 100%    Constitutional:  . The patient is awake, alert, and oriented x 3. No acute distress. Respiratory:  . No increased work of breathing. . No wheezes, rales, or rhonchi. . No tactile fremitus. Cardiovascular:  . Regular rate and rhythm . No murmurs, ectopy, or gallups. . No lateral PMI. No thrills. Abdomen:  . Abdomen is soft, non-tender, non-distended. . No hernias, masses, or organomegaly . Normoactive bowel sounds.  Musculoskeletal:  . No cyanosis, clubbing,  or edema Skin:  . No rashes, lesions, ulcers . palpation of skin: no induration or nodules Neurologic:  . CN 2-12 intact . Sensation all 4 extremities intact Psychiatric:  . judgement and insight appear normal . Mental status o Mood, affect appropriate o Orientation to person, place, time     Discharge Instructions  Discharge  Instructions    Activity as tolerated - No restrictions   Complete by: As directed    Call MD for:  difficulty breathing, headache or visual disturbances   Complete by: As directed    Call MD for:  temperature >100.4   Complete by: As directed    Diet - low sodium heart healthy   Complete by: As directed    Discharge instructions   Complete by: As directed    The patient is to follow up with PCP in 7-10 days.   Increase activity slowly   Complete by: As directed      Allergies as of 05/07/2019      Reactions   Contrast Media [iodinated Diagnostic Agents] Shortness Of Breath, Nausea And Vomiting   Lisinopril Cough      Medication List    TAKE these medications   allopurinol 300 MG tablet Commonly known as: ZYLOPRIM Take 300 mg by mouth daily.   amiodarone 200 MG tablet Commonly known as: PACERONE Take 1 tablet (200 mg total) by mouth daily.   amoxicillin-clavulanate 875-125 MG tablet Commonly known as: Augmentin Take 1 tablet by mouth 2 (two) times daily for 10 days.   apixaban 5 MG Tabs tablet Commonly known as: ELIQUIS Take 1 tablet (5 mg total) by mouth 2 (two) times daily.   atorvastatin 40 MG tablet Commonly known as: LIPITOR Take 1 tablet (40 mg total) by mouth daily at 6 PM.   carvedilol 25 MG tablet Commonly known as: COREG Take 1 tablet (25 mg total) by mouth 2 (two) times daily with a meal.   [employer] Vitamin D3 25 MCG (1000 UT) capsule Generic drug: Cholecalciferol Take 1,000 Units by mouth daily.   furosemide 40 MG tablet Commonly known as: LASIX Take 40 mg by mouth 2 (two) times daily.   Jardiance 25 MG Tabs tablet Generic drug: empagliflozin Take 25 mg by mouth daily.   losartan 25 MG tablet Commonly known as: COZAAR Take 1 tablet (25 mg total) by mouth daily.   magnesium oxide 400 MG tablet Commonly known as: MAG-OX Take 200 mg by mouth 2 (two) times daily.   pantoprazole 40 MG tablet Commonly known as: PROTONIX Take 1 tablet (40 mg  total) by mouth daily.   predniSONE 20 MG tablet Commonly known as: Deltasone Take 2 tablets (40 mg total) by mouth daily with breakfast for 3 days, THEN 1 tablet (20 mg total) daily with breakfast for 3 days, THEN 0.5 tablets (10 mg total) daily with breakfast for 3 days. Start taking on: May 07, 2019      Allergies  Allergen Reactions  . Contrast Media [Iodinated Diagnostic Agents] Shortness Of Breath and Nausea And Vomiting  . Lisinopril Cough    The results of significant diagnostics from this hospitalization (including imaging, microbiology, ancillary and laboratory) are listed below for reference.    Significant Diagnostic Studies: Dg Chest Port 1 View  Result Date: 05/05/2019 CLINICAL DATA:  Shortness of breath. EXAM: PORTABLE CHEST 1 VIEW COMPARISON:  04/06/2019 FINDINGS: Airspace consolidation is noted throughout most of the right lung with sparing of the right apex. Mild interstitial prominence noted lower lung  on the left, stable from the prior exam. No left lung consolidation. No convincing pleural effusion.  No pneumothorax. Cardiac silhouette is mildly enlarged. No mediastinal or hilar masses. Stable left anterior chest wall AICD. IMPRESSION: 1. Right sided airspace lung opacities consistent with multifocal pneumonia. Electronically Signed   By: Lajean Manes M.D.   On: 05/05/2019 19:26    Microbiology: Recent Results (from the past 240 hour(s))  SARS Coronavirus 2 [facility] order, Performed in [facility] hospital lab) Nasopharyngeal Nasopharyngeal Swab     Status: None   Collection Time: 05/05/19  8:25 PM   Specimen: Nasopharyngeal Swab  Result Value Ref Range Status   SARS Coronavirus 2 NEGATIVE NEGATIVE Final    Comment: (NOTE) If result is NEGATIVE SARS-CoV-2 target nucleic acids are NOT DETECTED. The SARS-CoV-2 RNA is generally detectable in upper and lower  respiratory specimens during the acute phase of infection. The lowest  concentration of SARS-CoV-2  viral copies this assay can detect is 250  copies / mL. A negative result does not preclude SARS-CoV-2 infection  and should not be used as the sole basis for treatment or other  patient management decisions.  A negative result may occur with  improper specimen collection / handling, submission of specimen other  than nasopharyngeal swab, presence of viral mutation(s) within the  areas targeted by this assay, and inadequate number of viral copies  (<250 copies / mL). A negative result must be combined with clinical  observations, patient history, and epidemiological information. If result is POSITIVE SARS-CoV-2 target nucleic acids are DETECTED. The SARS-CoV-2 RNA is generally detectable in upper and lower  respiratory specimens dur ing the acute phase of infection.  Positive  results are indicative of active infection with SARS-CoV-2.  Clinical  correlation with patient history and other diagnostic information is  necessary to determine patient infection status.  Positive results do  not rule out bacterial infection or co-infection with other viruses. If result is PRESUMPTIVE POSTIVE SARS-CoV-2 nucleic acids MAY BE PRESENT.   A presumptive positive result was obtained on the submitted specimen  and confirmed on repeat testing.  While 2019 novel coronavirus  (SARS-CoV-2) nucleic acids may be present in the submitted sample  additional confirmatory testing may be necessary for epidemiological  and / or clinical management purposes  to differentiate between  SARS-CoV-2 and other Sarbecovirus currently known to infect humans.  If clinically indicated additional testing with an alternate test  methodology [phone]) is advised. The SARS-CoV-2 RNA is generally  detectable in upper and lower respiratory sp ecimens during the acute  phase of infection. The expected result is Negative. Fact Sheet for Patients:  StrictlyIdeas.no Fact Sheet for Healthcare Providers:  BankingDealers.co.za This test is not yet approved or cleared by the [place] FDA and has been authorized for detection and/or diagnosis of SARS-CoV-2 by FDA under an Emergency Use Authorization (EUA).  This EUA will remain in effect (meaning this test can be used) for the duration of the COVID-19 declaration under Section 564(b)(1) of the Act, 21 U.S.C. section 360bbb-3(b)(1), unless the authorization is terminated or revoked sooner. Performed at [facility], [place] [address] [place], [place] [address]   Blood culture (routine x 2)     Status: None (Preliminary result)   Collection Time: 05/05/19  8:42 PM   Specimen: BLOOD RIGHT HAND  Result Value Ref Range Status   Specimen Description BLOOD RIGHT HAND  Final   Special Requests   Final    BOTTLES DRAWN  AEROBIC AND ANAEROBIC Blood Culture results may not be optimal due to an inadequate volume of blood received in culture bottles   Culture   Final    NO GROWTH 2 DAYS Performed at [facility], [place] [address] [place], [place] [address]    Report Status PENDING  Incomplete  Blood culture (routine x 2)     Status: None (Preliminary result)   Collection Time: 05/06/19 12:29 AM   Specimen: BLOOD  Result Value Ref Range Status   Specimen Description BLOOD RIGHT ANTECUBITAL  Final   Special Requests   Final    BOTTLES DRAWN AEROBIC AND ANAEROBIC Blood Culture results may not be optimal due to an excessive volume of blood received in culture bottles   Culture   Final    NO GROWTH 1 DAY Performed at [facility], [place] [address] [place], [place] [address]    Report Status PENDING  Incomplete     Labs: Basic Metabolic Panel: Recent Labs  Lab 05/05/19 1920 05/06/19 0526 05/07/19 0400  NA 138 139 135  K 4.7 4.6 4.4  CL 102 103 100  CO2 22 25 25   GLUCOSE 193* 141* 135*  BUN 21 17 27*  CREATININE 2.02* 1.67* 1.74*  CALCIUM 10.0 9.7 9.7   Liver Function Tests: No results for  input(s): AST, ALT, ALKPHOS, BILITOT, PROT, ALBUMIN in the last 168 hours. No results for input(s): LIPASE, AMYLASE in the last 168 hours. No results for input(s): AMMONIA in the last 168 hours. CBC: Recent Labs  Lab 05/05/19 2045 05/06/19 0526 05/07/19 0400  WBC 16.6* 12.8* 20.4*  NEUTROABS  --   --  17.7*  HGB 14.4 12.8* 12.3*  HCT 46.3 41.5 38.7*  MCV 94.1 93.0 91.5  PLT 197 158 172   Cardiac Enzymes: No results for input(s): CKTOTAL, CKMB, CKMBINDEX, TROPONINI in the last 168 hours. BNP: BNP (last 3 results) Recent Labs    05/15/18 0950 04/06/19 1659 05/05/19 1920  BNP 679.6* 910.1* 329.5*    ProBNP (last 3 results) No results for input(s): PROBNP in the last 8760 hours.  CBG: Recent Labs  Lab 05/06/19 1143 05/06/19 1655 05/06/19 2121 05/07/19 0627 05/07/19 1241  GLUCAP 110* 121* 136* 145* 135*    Principal Problem:   HCAP (healthcare-associated pneumonia) Active Problems:   AKI (acute kidney injury) ([place])   Acute respiratory failure with hypoxia ([facility])   Sepsis ([place])   Acute asthma exacerbation   Acute respiratory distress   Time coordinating discharge: 38 minutes.  Signed:        Avina Eberle, DO [facility]  05/08/2019, 1:57

## 2019-05-07 NOTE — Plan of Care (Signed)

## 2019-05-07 NOTE — Progress Notes (Signed)
  Mobility Specialist Criteria Algorithm Info.  SATURATION QUALIFICATIONS: (This note is used to comply with regulatory documentation for home oxygen)  Patient Saturations on Room Air at Rest = 100%  Patient Saturations on Room Air while Ambulating = 98%  Patient Saturations on N/A Liters of oxygen while Ambulating = N/A%  Please briefly explain why patient needs home oxygen:  Mobility:  HOB elevated:Self regulated  Activity: Ambulated in hall;Dangled on edge of bed  Range of motion: Active;All extremities  Level of assistance: Independent  Assistive device: None  Minutes stood: 5 minutes  Minutes ambulated: 5 minutes  Distance ambulated (ft): 480 ft  Mobility response: Tolerated well  Bed Position: Semi-fowlers  Mobility Status: Yes, no lift needed  Assistive Device: None  Level of Assistance: Independent  05/07/2019 11:21 AM

## 2019-05-08 LAB — GLUCOSE, CAPILLARY
Glucose-Capillary: 119 mg/dL — ABNORMAL HIGH (ref 70–99)
Glucose-Capillary: 139 mg/dL — ABNORMAL HIGH (ref 70–99)

## 2019-05-08 LAB — PROCALCITONIN: Procalcitonin: 1.04 ng/mL

## 2019-05-08 NOTE — Progress Notes (Signed)
Discharge instruction and medication information was given to patient. Pt left the unit with his belongings via wheelchair by volunteer staff.

## 2019-05-08 NOTE — Progress Notes (Signed)
PROGRESS NOTE  Dean Bones Sr. TRZ:[id number] DOB: July 24, 1951 DOA: 05/05/2019 PCP: [name], MD  Brief History   Dean Adames Sr. is a 68 y.o. 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 Patient states his toilet was clogged so his daughter poured Drano into it.  The toilet continue to be clogged so then he put some bleach in it which caused him to inhale fumes.  He then started feeling short of breath and coughing.  States prior to this he was feeling well the last few days and was not having any shortness of breath, cough, or chest pain.  Denies orthopnea or lower extremity edema.  Denies fevers, chills, or fatigue.  He continues to takes Lasix twice a day.  Denies any nausea, vomiting, abdominal pain, or diarrhea.  No other complaints.  Patient was admitted 7/12 to 7/15 for acute hypoxic respiratory failure secondary to multifocal pneumonia.  He was treated with cefepime during his hospitalization and discharged home on oral Augmentin to complete a 5-day course of antibiotics.  ED Course: Afebrile.  Initially tachycardic and tachypneic on arrival, now improved.  Not hypotensive.  White count 16.6.  Lactic acid 2.9.  Creatinine 2.0, was 1.3 on 04/09/2019.  BNP 329.  High-sensitivity troponin 12 >27.  EKG without acute ischemic changes.  COVID-19 rapid test negative.  Blood culture x2 pending.  Chest x-ray showing right-sided airspace lung opacities consistent with multifocal pneumonia. Wheezing on exam.  Patient received albuterol, Solu-Medrol 125 mg, azithromycin, and ceftriaxone.  Consultants  . None  Procedures  . None  Antibiotics   Anti-infectives (From admission, onward)   Start     Dose/Rate Route  Frequency Ordered Stop   05/07/19 0000  amoxicillin-clavulanate (AUGMENTIN) 875-125 MG tablet     1 tablet Oral 2 times daily 05/07/19 1303 05/17/19 2359   05/06/19 0800  piperacillin-tazobactam (ZOSYN) IVPB 3.375 g     3.375 g 12.5 mL/hr over 240 Minutes Intravenous Every 8 hours 05/05/19 2341     05/05/19 2345  piperacillin-tazobactam (ZOSYN) IVPB 3.375 g     3.375 g 100 mL/hr over 30 Minutes Intravenous  Once 05/05/19 2341 05/06/19 0234   05/05/19 1945  cefTRIAXone (ROCEPHIN) 1 g in sodium chloride 0.9 % 100 mL IVPB     1 g 200 mL/hr over 30 Minutes Intravenous  Once 05/05/19 1935 05/05/19 2255   05/05/19 1945  azithromycin (ZITHROMAX) 500 mg in sodium chloride 0.9 % 250 mL IVPB     500 mg 250 mL/hr over 60 Minutes Intravenous  Once 05/05/19 1935 05/05/19 2325     .   Subjective  The patient states that he is feeling better. No fevers, no sputum production, no chest pain.  Objective   Vitals:  Vitals:   05/08/19 0859 05/08/19 1137  BP: (!) 144/39 (!) 142/57  Pulse: 65 (!) 54  Resp:  20  Temp:  97.7 F (36.5 C)  SpO2: 100% 98%    Exam:  Constitutional:  . The patient is awake, alert, and oriented x 3. No acute distress. Respiratory:  . No increased work of breathing . Small wheezes bilaterally. No rales or rhonchi . No tactile fremitus Cardiovascular:  . Regular rate and rhythm. . No murmurs, ectopy, or gallups. Dean [name] Kitchen No  lateral PMI. No thrills. Abdomen:  . Abdomen is soft, non-tender, non-distended. . No hernias, masses, or organomegaly. . Normoactive bowel sounds.  Musculoskeletal:  . Non cyanosis, clubbing, or edema Skin:  . No rashes, lesions, ulcers . palpation of skin: no induration or nodules Neurologic:  . CN 2-12 intact . Sensation all 4 extremities intact Psychiatric:  . Mental status o Mood, affect appropriate o Orientation to person, place, time  . judgment and insight appear intact    I have personally reviewed the following:   Today's  Data  . Vitals, Procalcitonin, CBC, BMP  Imaging  . CXR  Scheduled Meds: . allopurinol  300 mg Oral Daily  . amiodarone  200 mg Oral Daily  . apixaban  5 mg Oral BID  . atorvastatin  40 mg Oral q1800  . carvedilol  25 mg Oral BID WC  . furosemide  40 mg Oral BID  . insulin aspart  0-5 Units Subcutaneous QHS  . insulin aspart  0-9 Units Subcutaneous TID WC  . losartan  25 mg Oral Daily  . magnesium oxide  200 mg Oral BID  . pantoprazole  40 mg Oral Daily  . predniSONE  40 mg Oral Q breakfast   Continuous Infusions: . piperacillin-tazobactam (ZOSYN)  IV 3.375 g (05/08/19 0905)    Principal Problem:   HCAP (healthcare-associated pneumonia) Active Problems:   AKI (acute kidney injury) ([place])   Acute respiratory failure with hypoxia ([place])   Sepsis ([place])   Acute asthma exacerbation   Acute respiratory distress   LOS: 2 days   A & P   Acute hypoxic respiratory failure: Resolving. The patient is currently saturating at 100% on 2L by nasal cannula. Wean as able. The patient is receiving supplemental oxygen nebulizer treatments, steroids, and IV antibiotics.   HCAP: Patient was inpatient 04/06/2019. He returns with increased shortness of breath, leukocytosis, and evidnece of a multifocal pneumonia on the right. Procalcitonin is positive at 1.2. He is receiving IV zosyn.   Sepsis: Initially based upon the patient's tachycardia, tachypnea, leukocytosis, lactic acid, hypoxia, and right sided airspace disease. Resolving with IV fluids and IV antibiotics. Blood cultures drawn and have had no growth.  AKI: Initial creatinine was 2.02. This morning down to 1.67. Baseline creatinine appears to be 1.34 - 1.4.  Acute asthma exacerbation due to inhalational injury: Nebulizer treatments and steroids. Due to the patient putting bleach in a toilet that had been treated with drain cleaner. Pt states that he immediately had difficulty breathing.  I have seen and examined this patient myself. I  have spent 36 minutes in his evaluation and care.  DVT prophylaxis: Eliquis Code Status: Full Code Family Communication: None available. Disposition Plan: Home.   Praneeth Bussey, DO [facility] Direct contact: see [email or web address]  7PM-7AM contact night coverage as above 05/07/2019, 1:48 PM  LOS: 0 days

## 2019-05-09 ENCOUNTER — Other Ambulatory Visit: Payer: Self-pay | Admitting: Cardiology

## 2019-05-09 MED ORDER — FUROSEMIDE 40 MG PO TABS: 40.0000 mg | ORAL_TABLET | Freq: Two times a day (BID) | ORAL | 3 refills | Status: DC

## 2019-05-09 NOTE — Progress Notes (Signed)
Patient hospitalized 8/10 - 8/13.

## 2019-05-09 NOTE — Telephone Encounter (Signed)
Pt's medication was sent to pt's pharmacy as requested. Confirmation received.  °

## 2019-05-10 LAB — CULTURE, BLOOD (ROUTINE X 2): Culture: NO GROWTH

## 2019-05-11 LAB — CULTURE, BLOOD (ROUTINE X 2): Culture: NO GROWTH

## 2019-05-13 ENCOUNTER — Other Ambulatory Visit: Payer: Self-pay

## 2019-05-13 ENCOUNTER — Ambulatory Visit (HOSPITAL_COMMUNITY)
Admission: RE | Admit: 2019-05-13 | Discharge: 2019-05-13 | Disposition: A | Discharge status: Home / Self Care | Payer: Medicare Other | Source: Ambulatory Visit | Attending: Cardiovascular Disease | Admitting: Cardiovascular Disease

## 2019-05-13 DIAGNOSIS — I739 Peripheral vascular disease, unspecified: Secondary | ICD-10-CM | POA: Diagnosis present

## 2019-05-15 ENCOUNTER — Telehealth: Payer: Self-pay

## 2019-05-15 NOTE — Telephone Encounter (Signed)
-----   Message from Consuelo Pandy, [place] sent at 05/14/2019  5:59 PM EDT ----- Leg ultrasound is stable in comparrison to prior study last year. Normal blood flow. Circulation is good.

## 2019-05-15 NOTE — Telephone Encounter (Signed)
Notes recorded by Frederik Schmidt, RN on 05/15/2019 at 8:31 AM EDT  The patient has been notified of the result and verbalized understanding. All questions (if any) were answered.  Frederik Schmidt, RN 05/15/2019 8:30 AM

## 2019-05-19 ENCOUNTER — Ambulatory Visit (INDEPENDENT_AMBULATORY_CARE_PROVIDER_SITE_OTHER): Payer: Medicare Other

## 2019-05-19 ENCOUNTER — Telehealth: Payer: Self-pay | Admitting: Internal Medicine

## 2019-05-19 DIAGNOSIS — Z95810 Presence of automatic (implantable) cardiac defibrillator: Secondary | ICD-10-CM

## 2019-05-19 DIAGNOSIS — I5022 Chronic systolic (congestive) heart failure: Secondary | ICD-10-CM

## 2019-05-19 NOTE — Telephone Encounter (Signed)
° ° °  Pt c/o swelling: STAT is pt has developed SOB within 24 hours  1) How much weight have you gained and in what time span? n/a  2) If swelling, where is the swelling located? feet  3) Are you currently taking a fluid pill? yes  4) Are you currently SOB? no  Do you have a log of your daily weights (if so, list)?  5) Have you gained 3 pounds in a day or 5 pounds in a week?   6) Have you traveled recently? no

## 2019-05-19 NOTE — Telephone Encounter (Signed)
Transmission received. Spoke with patient. He reports he was trying to return call to Sharman Cheek, RN for his ICM f/u. Gave direct number and will route this message. Pt in agreement with plan. No further questions at this time.

## 2019-05-19 NOTE — Telephone Encounter (Signed)
Pt called to report the last fe days his feet have been swollen up to his ankles... he can still wear his normal shoes.. no SOB, chest pain... he has an appt in the HTN clinic this Wed 05/21/19... his BP last night was 117/65... he has been taking all of his meds. I advised him to elevate his feet and to watch his sodium intake.. he agreed he needs to do these things... he will continue to monitor and call if anything changes or worsens.   Pt asking of his remote transmission wen though this morning and has some questions about his next check.[name] advised him that I will forward to our device nurses for review.

## 2019-05-19 NOTE — Progress Notes (Signed)
EPIC Encounter for ICM Monitoring  Patient Name: Dean Mileto Sr. is a 68 y.o. male Date: 05/19/2019 Primary Care Physican: [name], MD Primary Cardiologist:Hilty/Brittany [facility] Electrophysiologist:Camnitz 04/28/2019 Weight:254lbs  05/19/2019 Weight: 256-258 lbs 2-3 lbs higher than baseline  Spoke with patient and he reports swelling and tightening of feet for the last few days but can still wear shoes. Weight gain of 2-3 lbs in last 5 days.  He completed 10 day antibiotic course and prednisone tapered dosage on 8/21.     3 hospitalizations since June 2020 and last inpatient stay was 8/10 - 8/13 with dx of Pneumonia.   OptivolThoracic impedance suggesting possible ongoing fluid accumulation since 03/10/2019.  Prescribed: Furosemide40 mg take 1 tablet (40 mg total) by mouth twice a day.Confirmed he is taking Lasix 40 mg twice daily.   Labs: 05/07/2019 Creatinine 1.74, BUN 27,   Potassium 4.4, Sodium 135, GFR 39-45 05/06/2019 Creatinine 1.67, BUN 17,   Potassium 4.6, Sodium 139, GFR 41-48  05/05/2019 Creatinine 2.02, BUN 21,   Potassium 4.7, Sodium 138, GFR 33-38  04/09/2019 Creatinine1.39, BUN20,   Potassium3.6, Sodium139, [id number] 04/08/2019 Creatinine1.34, BUN17,   Potassium3.7, [id number], GFR54->60  04/07/2019 Creatinine1.42, BUN15,   Potassium3.2, [id number], [id number]  04/06/2019 Creatinine1.40, BUN13,   Potassium3.8, [id number], GFR51-59 03/10/2019 Creatinine2.39, BUN61, Potassium4.2, Sodium136, [id number] 03/02/2019 Creatinine2.64, BUN79, Potassium4.8, Sodium136, [id number]  03/01/2019 Creatinine3.54, BUN103, Potassium5.2, [id number], [id number]  02/28/2019 Creatinine4.71, [id number], Potassium5.2, [id number], [id number]  02/27/2019 Creatinine5.92, [id number], Potassium4.7, Sodium131, GFR9-10 A complete set of results can be found in Results Review.  Recommendations: Advised will call back if any physician recommendations  are made.  Explained he should call back if worsening of symptoms or development of new symptoms.                                          Advised to elevate legs, limit fluid intake to 64 oz daily and limit salt intake to 2000 mg. Reviewed how to read food labels for salt amount.    Follow-up plan: ICM clinic phone appointment on8/31/2020 to recheck fluid levels.    Copy of ICM check sent to Dr.Camnitzand Dr Debara Pickett.  3 month ICM trend: 05/19/2019    1 Year ICM trend:       Rosalene Billings, RN 05/19/2019 10:01 AM

## 2019-05-20 NOTE — Telephone Encounter (Signed)
Spoke with patient.. See ICM note 

## 2019-05-21 ENCOUNTER — Other Ambulatory Visit: Payer: Self-pay

## 2019-05-21 ENCOUNTER — Ambulatory Visit (INDEPENDENT_AMBULATORY_CARE_PROVIDER_SITE_OTHER): Payer: Medicare Other | Admitting: Pharmacist

## 2019-05-21 VITALS — BP 150/50 | HR 57

## 2019-05-21 DIAGNOSIS — I10 Essential (primary) hypertension: Secondary | ICD-10-CM | POA: Diagnosis not present

## 2019-05-21 DIAGNOSIS — I5043 Acute on chronic combined systolic (congestive) and diastolic (congestive) heart failure: Secondary | ICD-10-CM | POA: Diagnosis not present

## 2019-05-21 LAB — BASIC METABOLIC PANEL
BUN/Creatinine Ratio: 11 (ref 10–24)
BUN: 18 mg/dL (ref 8–27)
CO2: 24 mmol/L (ref 20–29)
Calcium: 9.2 mg/dL (ref 8.6–10.2)
Chloride: 104 mmol/L (ref 96–106)
Creatinine, Ser: 1.58 mg/dL — ABNORMAL HIGH (ref 0.76–1.27)
GFR calc Af Amer: 51 mL/min/{1.73_m2} — ABNORMAL LOW (ref >59)
GFR calc non Af Amer: 44 mL/min/{1.73_m2} — ABNORMAL LOW (ref >59)
Glucose: 115 mg/dL — ABNORMAL HIGH (ref 65–99)
Potassium: 3.6 mmol/L (ref 3.5–5.2)
Sodium: 141 mmol/L (ref 134–144)

## 2019-05-21 NOTE — Patient Instructions (Signed)
We will call you tomorrow to discuss the results of your labs and discuss any medication changes  Call us at [phone] with any questions or concerns.   There is a mobile screening test site at [facility] today from 8-12

## 2019-05-21 NOTE — Progress Notes (Signed)
Patient ID: Dean Gulan Sr.                 DOB: [birth year]                      MRN: [id number]     HPI: Dean Glisan Sr. is a 68 y.o. male patient of Dr. Hilty/Camnitz referred by Dean [name] to HTN clinic. PMH is significant for systolic heart failure (EF 30-35%), CAD, VT VF s/p ICD placement, DM, HTN, afib and HLD. Currently NYHA Functional class II. He was recently started on Jardiance by his PCP.  Patient seen in clinic today for follow up on medication changes. At last visit with Brittainy, losartan 25mg  daily was added.  Patient was admitted recently to the hospital with PNA/sepsis/mild AKI. Scr has started to trend back down. Will need to check today to make sure it has come back to baseline as patient was stated on jardiance and losartan earlier this month.  Patients losartan was changed 2 days ago to edarbi (azilsartan 40mg ) by his PCP. He did not take it today. His PCP gave him samples. He states he told his PCP that the losartan made him feel different - tired, weak. Patient's story was somewhat confusing and I'm not entirely sure this was the losartan. He denies dizziness, light headedness.  States that he has swelling in his legs for the last several days. Getting a little better. He increased his lasix to 80mg  twice a day from 40mg  twice a day. Also trying to elevate his legs. Also states he feels a little winded today. His O2 stats were 99%. No visible trouble breathing. On follow up phone call after visit, patient states he was feeling a lot better. States he has an itchy rash that started about 3 days after hospital discharge. It is improving. Was discharged on antibiotics. Unsure if he has even been on them before.  Current HTN meds: azilsartan 40mg  daily, carvedilol 25mg  twice a day, jardiance 25mg  daily, furosemide 40mg  twice a day Previously tried: Entresto (dehydration-was combined w/ metolazone and lasix) BP goal: <130/80  Family History:  Heart attack Father          died in his 88's  . Hypertension Sister   . Cancer Brother        uncertain, "leg"  . Hypertension Sister      Home BP readings: 117-145/50's  Wt Readings from Last 3 Encounters:  05/08/19 254 lb 1.6 oz (115.3 kg)  04/28/19 254 lb (115.2 kg)  04/11/19 266 lb (120.7 kg)   BP Readings from Last 3 Encounters:  05/08/19 (!) 142/57  04/28/19 (!) 140/58  04/11/19 (!) 142/72   Pulse Readings from Last 3 Encounters:  05/08/19 (!) 54  04/28/19 65  04/11/19 70    Renal function: Estimated Creatinine Clearance: 47.8 mL/min (A) (by C-G formula based on SCr of 1.74 mg/dL (H)).  Past Medical History:  Diagnosis Date  . 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 Arthritis    "maybe in his spine" (09/05/2016)  . Asthma    pt. denies  . Cardiac arrest ([place]) 08/27/2016   REQUIRING CPR, SHOCK & MEDICATIONS  . CHF (congestive heart failure) ([place])   . Coronary artery disease    90% ostRCA stenosis and total occlusion of dRCA with L-->R collaterals 2017  . Dysrhythmia   . Gout   . High cholesterol   . Hypertension   . Ischemic cardiomyopathy   . LBBB (left bundle  branch block)   . OSA on CPAP   . Type II diabetes mellitus ([place])     Current Outpatient Medications on File Prior to Visit  Medication Sig Dispense Refill  . allopurinol (ZYLOPRIM) 300 MG tablet Take 300 mg by mouth daily.    [name] amiodarone (PACERONE) 200 MG tablet Take 1 tablet (200 mg total) by mouth daily. 90 tablet 2  . apixaban (ELIQUIS) 5 MG TABS tablet Take 1 tablet (5 mg total) by mouth 2 (two) times daily. 60 tablet 5  . atorvastatin (LIPITOR) 40 MG tablet Take 1 tablet (40 mg total) by mouth daily at 6 PM. 30 tablet 5  . carvedilol (COREG) 25 MG tablet Take 1 tablet (25 mg total) by mouth 2 (two) times daily with a meal. 60 tablet 7  . CVS VITAMIN D3 1000 units capsule Take 1,000 Units by mouth daily.  5  . empagliflozin (JARDIANCE) 25 MG TABS tablet Take 25 mg by mouth  daily. 30 tablet 0  . furosemide (LASIX) 40 MG tablet Take 1 tablet (40 mg total) by mouth 2 (two) times daily. 180 tablet 3  . losartan (COZAAR) 25 MG tablet Take 1 tablet (25 mg total) by mouth daily. 90 tablet 3  . magnesium oxide (MAG-OX) 400 MG tablet Take 200 mg by mouth 2 (two) times daily.    . pantoprazole (PROTONIX) 40 MG tablet Take 1 tablet (40 mg total) by mouth daily. 30 tablet 5   No current facility-administered medications on file prior to visit.     Allergies  Allergen Reactions  . Contrast Media [Iodinated Diagnostic Agents] Shortness Of Breath and Nausea And Vomiting  . Lisinopril Cough    There were no vitals taken for this visit.   Assessment/Plan:  1. Hypertension - Patient's blood pressure is not at goal of <130/80. Appears more controlled at home, he did not take his new ARB this AM. Although still not at goal, nor optimized to target dose. I am concerned about the addition of this new ARB. I am concerned about the cost/ if his insurance will even cover. It also does not have any evidence in heart failure. Patient had a BMP drawn today. Results pending. Discussed with patient possibly restarting Entresto due to his recent issues with fluid overload (not convinced the Entresto caused him dehydration- was on metolazone at the time-probably the combo), starting a different ARB such as valsartan or resuming losartan (he took losartan for several years prior to switching to [place]). This will all be pending the results of his renal function. Will contact patient via phone when lab results to discuss.  Patient seemed concerned over [place]. I did provide him the location of the drive up testing site today.  Thank you  Ramond Dial, Pharm.D, [place]  [id number] [place], [place] [address]  Phone: [phone]; Fax: [phone]

## 2019-05-22 ENCOUNTER — Telehealth: Payer: Self-pay | Admitting: Pharmacist

## 2019-05-22 NOTE — Telephone Encounter (Addendum)
BMET shows normal electrolytes and improved renal function. HF medications include: carvedilol 25mg  BID, azilsartan 40mg  daily, furosemide 40mg  BID, and Jardiance 25mg  daily.  Left message to discuss with pt. If Dean [name] is cost prohibitive for pt ($47/month after $435 deductible), will stop azilsartan and start valsartan 320mg  daily which is approved in HFrEF patients.  Will schedule f/u appt in clinic in 2 weeks, will need to recheck BMET at that time.

## 2019-05-23 MED ORDER — VALSARTAN 320 MG PO TABS: 320.0000 mg | ORAL_TABLET | Freq: Every day | ORAL | 11 refills | Status: DC

## 2019-05-23 NOTE — Telephone Encounter (Signed)
Spoke with pt - he is agreeable to stopping [place] and starting valsartan 320mg  daily. Scheduled f/u visit in pharmacy clinic in 10-14 days for repeat BMET and BP check.

## 2019-05-23 NOTE — Progress Notes (Signed)
Call to patient. He reports he is feeling better.  Minimal swelling in feet and weight dropped to baseline of 252 - 253 lbs.  Asked if he could send remote transmission today but he was not at home and said he would send a new transmission on Monday.  Advised if condition gets worse over the weekend to use 911 or physician on call if needed.

## 2019-05-23 NOTE — Progress Notes (Signed)
Attempted call to patient to obtain update and request new remote transmission to review fluid levels.  No answer.

## 2019-05-26 ENCOUNTER — Ambulatory Visit: Payer: Medicare Other

## 2019-05-26 ENCOUNTER — Telehealth: Payer: Self-pay

## 2019-05-26 ENCOUNTER — Other Ambulatory Visit: Payer: Self-pay

## 2019-05-26 DIAGNOSIS — I5022 Chronic systolic (congestive) heart failure: Secondary | ICD-10-CM

## 2019-05-26 DIAGNOSIS — Z95810 Presence of automatic (implantable) cardiac defibrillator: Secondary | ICD-10-CM

## 2019-05-26 NOTE — Progress Notes (Signed)
EPIC Encounter for ICM Monitoring  Patient Name: Dean Jupin Sr. is a 68 y.o. male Date: 05/26/2019 Primary Care Physican: [name], MD Primary Cardiologist:Hilty/Brittany [facility] Electrophysiologist:Camnitz 04/28/2019 Weight:254lbs  05/19/2019 Weight: 256-258 lbs 2-3 lbs higher than baseline 05/23/2019 Weight: 252-253 lbs 05/26/2019 Weight: 255 lbs  Spoke with patient.  He continues to have swelling in feet but no longer in the ankles so there is improvement. Weight continues to be ~3 lbs above baseline, denies SOB.  His fluid intake is > 64 oz daily.     3 hospitalizations since June 2020 and last inpatient stay was 8/10 - 8/13 with dx of Pneumonia.   OptivolThoracic impedancesuggesting possible ongoing fluid accumulation since 03/10/2019.  Prescribed: Furosemide40 mg take 1 tablet (40 mg total) by mouth twice a day.Patient has been taking Furosemide 1.5 tablets daily for past week instead of 1 tablet twice a day.   Explained to follow the prescription instructions provided to him on 05/09/2019 at the time of OV with Ellen Henri, [place].   Labs: 05/21/2019 Creatinine 1.58, BUN 18,   Potassium 3.6, Sodium 141, GFR 44-51 05/07/2019 Creatinine 1.74, BUN 27,   Potassium 4.4, Sodium 135, GFR 39-45 05/06/2019 Creatinine 1.67, BUN 17,   Potassium 4.6, Sodium 139, GFR 41-48  05/05/2019 Creatinine 2.02, BUN 21,   Potassium 4.7, Sodium 138, GFR 33-38  04/09/2019 Creatinine1.39, BUN20,   Potassium3.6, Sodium139, [id number] 04/08/2019 Creatinine1.34, BUN17,   Potassium3.7, [id number], GFR54->60  04/07/2019 Creatinine1.42, BUN15,   Potassium3.2, [id number], [id number]  04/06/2019 Creatinine1.40, BUN13,   Potassium3.8, [id number], GFR51-59 03/10/2019 Creatinine2.39, BUN61, Potassium4.2, Sodium136, [id number] 03/02/2019 Creatinine2.64, BUN79, Potassium4.8, [id number], [id number]  03/01/2019 Creatinine3.54, BUN103, Potassium5.2, [id number], [id number]   02/28/2019 Creatinine4.71, [id number], Potassium5.2, [id number], [id number]  02/27/2019 Creatinine5.92, [id number], Potassium4.7, Sodium131, GFR9-10 A complete set of results can be found in Results Review.  Recommendations: Advised will call back if any physician recommendations are made.  Explained he should call back if worsening of symptoms or development of new symptoms.                                          Advised to elevate legs, limit fluid intake to 64 oz daily and limit salt intake to 2000 mg.    Follow-up plan: ICM clinic phone appointment on9/8/2020to recheck fluid levels.    Copy of ICM check sent to [facility] and [place], [name].   3 month ICM trend: 05/26/2019    1 Year ICM trend:       Rosalene Billings, RN 05/26/2019 10:04 AM

## 2019-05-26 NOTE — Telephone Encounter (Signed)
Ellen Henri, PA recommendation:   Device interrogation suggest fluid overload. Increase lasix to 40 mg BID. Keep plans to f/u in HTN Clinic and BMP next week (already ordered). Add on BNP For CHF to labs and route BNP result to me     Call to patient.  Advised of [place] Simmon's, PA recommendations.  He should take Lasix 40 mg BID. He will have labs drawn on 06/03/2019 when he goes to the pharmacist appointment. BMET previously ordered and added BNP.  Advised if any symptoms worsen to call with update.  He said he would do so.

## 2019-05-26 NOTE — Telephone Encounter (Signed)
° ° °  Please return call to patient at [phone]

## 2019-05-26 NOTE — Progress Notes (Signed)
Call to patient.  Advised of [place] Simmon's, PA recommendations.  He should take Lasix 40 mg BID. He will have labs drawn on 06/03/2019 when he goes to the pharmacist appointment. BMET previously ordered and added BNP.  Advised if any symptoms worsen to call with update.  He said he would do so.   See phone note with Brittany's recommendations.  Provided patient with direct ICM number.

## 2019-05-26 NOTE — Telephone Encounter (Signed)
ICM call to patient.  He continues to have swelling in feet but no longer in the ankles so there is improvement. Weight continues to be ~3 lbs above baseline, denies SOB.  His fluid intake is > 64 oz daily.     3 hospitalizations since June 2020 and last inpatient stay was 8/10 - 8/13 with dx of Pneumonia.   Weights: 04/28/2019 Weight:254lbs  05/19/2019 Weight: 256-258 lbs 2-3 lbs higher than baseline 05/23/2019 Weight: 252-253 lbs 05/26/2019 Weight: 255 lbs  OptivolThoracic impedancesuggesting possible ongoing fluid accumulation since 03/10/2019.  Prescribed: Furosemide40 mg take 1 tablet (40 mg total) by mouth twice a day.Patient has been taking Furosemide 1.5 tablets daily for past week instead of 1 tablet twice a day.   Explained to follow the prescription instructions provided to him on 05/09/2019 at the time of OV with Ellen Henri, [place].   Labs: 05/21/2019 Creatinine 1.58, BUN 18,   Potassium 3.6, Sodium 141, GFR 44-51 05/07/2019 Creatinine 1.74, BUN 27,   Potassium 4.4, Sodium 135, GFR 39-45 05/06/2019 Creatinine 1.67, BUN 17,   Potassium 4.6, Sodium 139, GFR 41-48  05/05/2019 Creatinine 2.02, BUN 21,   Potassium 4.7, Sodium 138, GFR 33-38  04/09/2019 Creatinine1.39, BUN20,   Potassium3.6, Sodium139, [id number] 04/08/2019 Creatinine1.34, BUN17,   Potassium3.7, [id number], GFR54->60  04/07/2019 Creatinine1.42, BUN15,   Potassium3.2, [id number], [id number]  04/06/2019 Creatinine1.40, BUN13,   Potassium3.8, [id number], GFR51-59 03/10/2019 Creatinine2.39, BUN61, Potassium4.2, Sodium136, [id number] 03/02/2019 Creatinine2.64, BUN79, Potassium4.8, [id number], [id number]  03/01/2019 Creatinine3.54, BUN103, Potassium5.2, [id number], [id number]  02/28/2019 Creatinine4.71, [id number], Potassium5.2, FE:[id number], [id number]  02/27/2019 Creatinine5.92, [id number], Potassium4.7, Sodium131, GFR9-10 A complete set of results can be found in Results Review.   Recommendations: Advised will call back if any physician recommendations are made.  Explained he should call back if worsening of symptoms or development of new symptoms.  Advised to limit fluid intake to 64 oz daily and salt intake to 2000 mg.    Follow-up plan: ICM clinic phone appointment on9/8/2020to recheck fluid levels.    Phone note sent to [place], [name] for review and recommendations if needed.   3 month ICM trend: 05/26/2019    1 Year ICM trend:

## 2019-06-02 NOTE — Progress Notes (Signed)
Patient ID: Dean Friedrich Sr.                 DOB: 1950-10-18                      MRN: [id number]    Dean Parlette Sr. is a 68 y.o. male patient of Dr. Hilty/Camnitz referred by Lyda Jester to HTN clinic. PMH is significant for systolic heart failure (EF 30-35%), CAD, VT VF s/p ICD placement, DM, HTN, afib and HLD. Currently NYHA Functional class II. At last HTN appt, azilsartan 40 mg daily was D/C and valsartan 320 mg daily was initiated. Recently, Dean [name] recommended increase of furosemide to 40 mg BID based on ICM data from device interrogation that suggested fluid overload.  Pt presents today stating he has been "feeling bad" since he has been discharged from the hospital (~2 weeks ago), specifically he has felt an increase in shortness of breath, weakness, and loss of appetite. He states his symptoms have progressed significantly over the past 5 days, especially shortness of breath. States he was considering going to the ED today instead of coming in for his visit. Pt states he has not experienced any AE related to valsartan considering he has been experiencing current s/sx before initiation of valsartan.   There was confusion about increasing his furosemide dose. Pt's current maintenance furosemide dose is 40mg  BID, he was advised during 05/26/19 telephone note to increase his dose to 40mg  BID so he continued on the same dose. He states he checks his blood pressure daily and he has noticed the valsartan has decreased his blood pressure, especially the systolic number. Pt states he has taken his BP meds this morning. Pt states his weight is typically ~255 lbs at home.  After pt was weighed and walked back to the office he stated he felt significantly worse and SOB. He stated he did not feel confident that he would be able to walk to his car when leaving appt. His brother accompanied him to his visit today.  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  Past Medical History:  Diagnosis Date  . 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 Arthritis    "maybe in his spine" (09/05/2016)  . Asthma    pt. denies  . Cardiac arrest ([place]) 08/27/2016   REQUIRING CPR, SHOCK & MEDICATIONS  . CHF (congestive heart failure) ([place])   .  Coronary artery disease    90% ostRCA stenosis and total occlusion of dRCA with L-->R collaterals 2017  . Dysrhythmia   . Gout   . High cholesterol   . Hypertension   . Ischemic cardiomyopathy   . LBBB (left bundle branch block)   . OSA on CPAP   . Type II diabetes mellitus ([place])     Current Outpatient Medications on File Prior to Visit  Medication Sig Dispense Refill  . allopurinol (ZYLOPRIM) 300 MG tablet Take 300 mg by mouth daily.    [name] amiodarone (PACERONE) 200 MG tablet Take 1 tablet (200 mg total) by mouth daily. 90 tablet 2  .  apixaban (ELIQUIS) 5 MG TABS tablet Take 1 tablet (5 mg total) by mouth 2 (two) times daily. 60 tablet 5  . atorvastatin (LIPITOR) 40 MG tablet Take 1 tablet (40 mg total) by mouth daily at 6 PM. 30 tablet 5  . carvedilol (COREG) 25 MG tablet Take 1 tablet (25 mg total) by mouth 2 (two) times daily with a meal. 60 tablet 7  . CVS VITAMIN D3 1000 units capsule Take 1,000 Units by mouth daily.  5  . empagliflozin (JARDIANCE) 25 MG TABS tablet Take 25 mg by mouth daily. 30 tablet 0  . furosemide (LASIX) 40 MG tablet Take 1 tablet (40 mg total) by mouth 2 (two) times daily. 180 tablet 3  . magnesium oxide (MAG-OX) 400 MG tablet Take 200 mg by mouth 2 (two) times daily.    . pantoprazole (PROTONIX) 40 MG tablet Take 1 tablet (40 mg total) by mouth daily. 30 tablet 5  . valsartan (DIOVAN) 320 MG tablet Take 1 tablet (320 mg total) by mouth daily. 30 tablet 11   No current facility-administered medications on file prior to visit.     Allergies  Allergen Reactions  . Contrast Media [Iodinated Diagnostic Agents] Shortness Of Breath and Nausea And Vomiting  . Lisinopril Cough     Assessment/Plan:  1. HF medication optimization - BP goal <130/80 mmHg. Pt's blood pressure appears controlled at home, however, is elevated at today's appt in the office. Based on pt's SOB, edema, and 5 lb weight gain, discussed increasing furosemide dose to 80 mg BID and monitoring edema/weight/SOB/blood pressure vs going to the ED. Pt preferred to go to the ED, confirmed his brother could drive him to [facility]. Plan to f/u with pt after D/C from hospital. BMET and BNP not drawn in clinic today due to ED admission.  Thank you for involving pharmacy to assist in providing Mr. Corron's care.   Drexel Iha, PharmD PGY2 Ambulatory Care Pharmacy Resident

## 2019-06-03 ENCOUNTER — Encounter (HOSPITAL_COMMUNITY): Payer: Self-pay | Admitting: Pharmacy Technician

## 2019-06-03 ENCOUNTER — Emergency Department (HOSPITAL_COMMUNITY): Payer: Medicare Other

## 2019-06-03 ENCOUNTER — Inpatient Hospital Stay (HOSPITAL_COMMUNITY)
Admission: EM | Admit: 2019-06-03 | Discharge: 2019-06-07 | DRG: 291 | Disposition: A | Discharge status: Home / Self Care | Payer: Medicare Other | Attending: Internal Medicine | Admitting: Internal Medicine

## 2019-06-03 ENCOUNTER — Other Ambulatory Visit: Payer: Medicare Other

## 2019-06-03 ENCOUNTER — Other Ambulatory Visit: Payer: Self-pay

## 2019-06-03 ENCOUNTER — Ambulatory Visit (INDEPENDENT_AMBULATORY_CARE_PROVIDER_SITE_OTHER): Payer: Medicare Other | Admitting: Pharmacist

## 2019-06-03 ENCOUNTER — Ambulatory Visit (INDEPENDENT_AMBULATORY_CARE_PROVIDER_SITE_OTHER): Payer: Medicare Other

## 2019-06-03 VITALS — BP 142/80 | HR 64 | Wt 260.0 lb

## 2019-06-03 DIAGNOSIS — I5021 Acute systolic (congestive) heart failure: Secondary | ICD-10-CM | POA: Diagnosis present

## 2019-06-03 DIAGNOSIS — I255 Ischemic cardiomyopathy: Secondary | ICD-10-CM | POA: Diagnosis present

## 2019-06-03 DIAGNOSIS — I130 Hypertensive heart and chronic kidney disease with heart failure and stage 1 through stage 4 chronic kidney disease, or unspecified chronic kidney disease: Principal | ICD-10-CM | POA: Diagnosis present

## 2019-06-03 DIAGNOSIS — E119 Type 2 diabetes mellitus without complications: Secondary | ICD-10-CM

## 2019-06-03 DIAGNOSIS — Z95810 Presence of automatic (implantable) cardiac defibrillator: Secondary | ICD-10-CM

## 2019-06-03 DIAGNOSIS — Z7984 Long term (current) use of oral hypoglycemic drugs: Secondary | ICD-10-CM

## 2019-06-03 DIAGNOSIS — E1122 Type 2 diabetes mellitus with diabetic chronic kidney disease: Secondary | ICD-10-CM | POA: Diagnosis present

## 2019-06-03 DIAGNOSIS — E876 Hypokalemia: Secondary | ICD-10-CM | POA: Diagnosis present

## 2019-06-03 DIAGNOSIS — I509 Heart failure, unspecified: Secondary | ICD-10-CM

## 2019-06-03 DIAGNOSIS — M109 Gout, unspecified: Secondary | ICD-10-CM | POA: Diagnosis present

## 2019-06-03 DIAGNOSIS — Z8674 Personal history of sudden cardiac arrest: Secondary | ICD-10-CM

## 2019-06-03 DIAGNOSIS — Z7901 Long term (current) use of anticoagulants: Secondary | ICD-10-CM

## 2019-06-03 DIAGNOSIS — E785 Hyperlipidemia, unspecified: Secondary | ICD-10-CM | POA: Diagnosis present

## 2019-06-03 DIAGNOSIS — I5022 Chronic systolic (congestive) heart failure: Secondary | ICD-10-CM

## 2019-06-03 DIAGNOSIS — I480 Paroxysmal atrial fibrillation: Secondary | ICD-10-CM | POA: Diagnosis present

## 2019-06-03 DIAGNOSIS — Z8249 Family history of ischemic heart disease and other diseases of the circulatory system: Secondary | ICD-10-CM

## 2019-06-03 DIAGNOSIS — G4733 Obstructive sleep apnea (adult) (pediatric): Secondary | ICD-10-CM | POA: Diagnosis present

## 2019-06-03 DIAGNOSIS — N183 Chronic kidney disease, stage 3 unspecified: Secondary | ICD-10-CM | POA: Diagnosis present

## 2019-06-03 DIAGNOSIS — I2510 Atherosclerotic heart disease of native coronary artery without angina pectoris: Secondary | ICD-10-CM | POA: Diagnosis present

## 2019-06-03 DIAGNOSIS — Z20828 Contact with and (suspected) exposure to other viral communicable diseases: Secondary | ICD-10-CM | POA: Diagnosis present

## 2019-06-03 DIAGNOSIS — Z79899 Other long term (current) drug therapy: Secondary | ICD-10-CM

## 2019-06-03 DIAGNOSIS — I5043 Acute on chronic combined systolic (congestive) and diastolic (congestive) heart failure: Secondary | ICD-10-CM

## 2019-06-03 DIAGNOSIS — E1169 Type 2 diabetes mellitus with other specified complication: Secondary | ICD-10-CM

## 2019-06-03 DIAGNOSIS — Z77098 Contact with and (suspected) exposure to other hazardous, chiefly nonmedicinal, chemicals: Secondary | ICD-10-CM | POA: Diagnosis present

## 2019-06-03 DIAGNOSIS — D631 Anemia in chronic kidney disease: Secondary | ICD-10-CM | POA: Diagnosis present

## 2019-06-03 DIAGNOSIS — I10 Essential (primary) hypertension: Secondary | ICD-10-CM | POA: Diagnosis present

## 2019-06-03 DIAGNOSIS — Z6841 Body Mass Index (BMI) 40.0 and over, adult: Secondary | ICD-10-CM

## 2019-06-03 DIAGNOSIS — Z91041 Radiographic dye allergy status: Secondary | ICD-10-CM

## 2019-06-03 DIAGNOSIS — I2582 Chronic total occlusion of coronary artery: Secondary | ICD-10-CM | POA: Diagnosis present

## 2019-06-03 DIAGNOSIS — I5082 Biventricular heart failure: Secondary | ICD-10-CM

## 2019-06-03 DIAGNOSIS — Z888 Allergy status to other drugs, medicaments and biological substances status: Secondary | ICD-10-CM

## 2019-06-03 LAB — BASIC METABOLIC PANEL
Anion gap: 11 (ref 5–15)
BUN: 15 mg/dL (ref 8–23)
CO2: 22 mmol/L (ref 22–32)
Calcium: 9.2 mg/dL (ref 8.9–10.3)
Chloride: 106 mmol/L (ref 98–111)
Creatinine, Ser: 1.3 mg/dL — ABNORMAL HIGH (ref 0.61–1.24)
GFR calc Af Amer: >60 mL/min (ref >60)
GFR calc non Af Amer: 56 mL/min — ABNORMAL LOW (ref >60)
Glucose, Bld: 124 mg/dL — ABNORMAL HIGH (ref 70–99)
Potassium: 3.4 mmol/L — ABNORMAL LOW (ref 3.5–5.1)
Sodium: 139 mmol/L (ref 135–145)

## 2019-06-03 LAB — CBC
HCT: 36 % — ABNORMAL LOW (ref 39.0–52.0)
Hemoglobin: 10.8 g/dL — ABNORMAL LOW (ref 13.0–17.0)
MCH: 27.8 pg (ref 26.0–34.0)
MCHC: 30 g/dL (ref 30.0–36.0)
MCV: 92.8 fL (ref 80.0–100.0)
Platelets: 251 10*3/uL (ref 150–400)
RBC: 3.88 MIL/uL — ABNORMAL LOW (ref 4.22–5.81)
RDW: 13.9 % (ref 11.5–15.5)
WBC: 9.4 10*3/uL (ref 4.0–10.5)
nRBC: 0 % (ref 0.0–0.2)

## 2019-06-03 MED ORDER — FUROSEMIDE 10 MG/ML IJ SOLN
40.0000 mg | Freq: Once | INTRAMUSCULAR | Status: AC
  Administered 2019-06-04: 40 mg via INTRAVENOUS
  Filled 2019-06-03: qty 4

## 2019-06-03 MED ORDER — SODIUM CHLORIDE 0.9% FLUSH
3.0000 mL | Freq: Once | INTRAVENOUS | Status: AC
  Administered 2019-06-04: 3 mL via INTRAVENOUS

## 2019-06-03 NOTE — Progress Notes (Signed)
EPIC Encounter for ICM Monitoring  Patient Name: Dean Tumolo Sr. is a 68 y.o. male Date: 06/03/2019 Primary Care Physican: [name], MD Primary Cardiologist:Hilty/Brittany Simmons,PA 05/26/2019 Weight: 255 lbs  06/03/2019 Weight: 254 lbs  Spoke with patient.  Wearing compression stockings and leg swelling has improved. Occasionally has a little shortness of breath but none today.  Discussed low salt diet and encouraged to look at food labels for salt amount and limit to 2000 mg daily.  OptivolThoracic impedancesuggesting possible ongoing fluid accumulation since 03/10/2019.  Prescribed: Furosemide40 mg take 1 tablet (40 mg total) by mouth twice a day.  Labs:  BMET & BNP to be drawn 06/03/2019 05/21/2019 Creatinine 1.58, BUN 18,   Potassium 3.6, Sodium 141, GFR 44-51 05/07/2019 Creatinine1.74, BUN27, Potassium 4.4, Sodium135, [id number] 05/06/2019 Creatinine1.67, BUN17, Potassium 4.6, Sodium139, [id number]  05/05/2019 Creatinine2.02, BUN21, Potassium 4.7, Sodium138, [id number] 04/09/2019 Creatinine1.39, BUN20, Potassium3.6, Sodium139, [id number] 04/08/2019 Creatinine1.34, BUN17, Potassium3.7, [id number], GFR54->60  04/07/2019 Creatinine1.42, BUN15, Potassium3.2, [id number], [id number]  04/06/2019 Creatinine1.40, BUN13, Potassium3.8, G3799576, [id number] 03/10/2019 Creatinine2.39, BUN61, Potassium4.2, Sodium136, [id number] 03/02/2019 Creatinine2.64, BUN79, Potassium4.8, [id number], [id number]  03/01/2019 Creatinine3.54, BUN103, Potassium5.2, [id number], [id number]  02/28/2019 Creatinine4.71, [id number], Potassium5.2, [id number], [id number]  02/27/2019 Creatinine5.92, [id number], Potassium4.7, Sodium131, GFR9-10 A complete set of results can be found in Results Review.  Recommendations: Scheduled OV with pharmacist today, 06/03/2019 followed by lab work.  Advised will send copy of report to Ellen Henri, [place] for review.     Follow-up plan: ICM clinic phone appointment on9/14/2020(manual send) to recheck fluid levels.   Copy of ICM check sent to Dr.Camnitz and [place], [place]  3 month ICM trend: 06/03/2019    1 Year ICM trend:       Rosalene Billings, RN 06/03/2019 11:28 AM

## 2019-06-03 NOTE — ED Triage Notes (Signed)
Pt arrives via pov with reports of fluid build up and SOB with exertion. Reports sob X5 days. Recently decreased lasix dose. C/o increased swelling to BLE.

## 2019-06-03 NOTE — ED Notes (Signed)
ED Provider at bedside. 

## 2019-06-04 ENCOUNTER — Encounter (HOSPITAL_COMMUNITY): Payer: Self-pay | Admitting: Internal Medicine

## 2019-06-04 DIAGNOSIS — N183 Chronic kidney disease, stage 3 (moderate): Secondary | ICD-10-CM | POA: Diagnosis present

## 2019-06-04 DIAGNOSIS — E876 Hypokalemia: Secondary | ICD-10-CM | POA: Diagnosis present

## 2019-06-04 DIAGNOSIS — I5082 Biventricular heart failure: Secondary | ICD-10-CM

## 2019-06-04 DIAGNOSIS — Z7901 Long term (current) use of anticoagulants: Secondary | ICD-10-CM | POA: Diagnosis not present

## 2019-06-04 DIAGNOSIS — I2510 Atherosclerotic heart disease of native coronary artery without angina pectoris: Secondary | ICD-10-CM | POA: Diagnosis present

## 2019-06-04 DIAGNOSIS — Z7984 Long term (current) use of oral hypoglycemic drugs: Secondary | ICD-10-CM | POA: Diagnosis not present

## 2019-06-04 DIAGNOSIS — I130 Hypertensive heart and chronic kidney disease with heart failure and stage 1 through stage 4 chronic kidney disease, or unspecified chronic kidney disease: Secondary | ICD-10-CM | POA: Diagnosis present

## 2019-06-04 DIAGNOSIS — Z6841 Body Mass Index (BMI) 40.0 and over, adult: Secondary | ICD-10-CM | POA: Diagnosis not present

## 2019-06-04 DIAGNOSIS — Z95810 Presence of automatic (implantable) cardiac defibrillator: Secondary | ICD-10-CM | POA: Diagnosis not present

## 2019-06-04 DIAGNOSIS — E1122 Type 2 diabetes mellitus with diabetic chronic kidney disease: Secondary | ICD-10-CM | POA: Diagnosis present

## 2019-06-04 DIAGNOSIS — I5021 Acute systolic (congestive) heart failure: Secondary | ICD-10-CM | POA: Diagnosis present

## 2019-06-04 DIAGNOSIS — I5043 Acute on chronic combined systolic (congestive) and diastolic (congestive) heart failure: Secondary | ICD-10-CM

## 2019-06-04 DIAGNOSIS — Z79899 Other long term (current) drug therapy: Secondary | ICD-10-CM | POA: Diagnosis not present

## 2019-06-04 DIAGNOSIS — Z8674 Personal history of sudden cardiac arrest: Secondary | ICD-10-CM | POA: Diagnosis not present

## 2019-06-04 DIAGNOSIS — Z91041 Radiographic dye allergy status: Secondary | ICD-10-CM | POA: Diagnosis not present

## 2019-06-04 DIAGNOSIS — Z8249 Family history of ischemic heart disease and other diseases of the circulatory system: Secondary | ICD-10-CM | POA: Diagnosis not present

## 2019-06-04 DIAGNOSIS — I5023 Acute on chronic systolic (congestive) heart failure: Secondary | ICD-10-CM

## 2019-06-04 DIAGNOSIS — G4733 Obstructive sleep apnea (adult) (pediatric): Secondary | ICD-10-CM | POA: Diagnosis present

## 2019-06-04 DIAGNOSIS — I255 Ischemic cardiomyopathy: Secondary | ICD-10-CM | POA: Diagnosis present

## 2019-06-04 DIAGNOSIS — M109 Gout, unspecified: Secondary | ICD-10-CM | POA: Diagnosis present

## 2019-06-04 DIAGNOSIS — Z888 Allergy status to other drugs, medicaments and biological substances status: Secondary | ICD-10-CM | POA: Diagnosis not present

## 2019-06-04 DIAGNOSIS — I2582 Chronic total occlusion of coronary artery: Secondary | ICD-10-CM | POA: Diagnosis present

## 2019-06-04 DIAGNOSIS — I509 Heart failure, unspecified: Secondary | ICD-10-CM

## 2019-06-04 DIAGNOSIS — D631 Anemia in chronic kidney disease: Secondary | ICD-10-CM | POA: Diagnosis present

## 2019-06-04 DIAGNOSIS — Z20828 Contact with and (suspected) exposure to other viral communicable diseases: Secondary | ICD-10-CM | POA: Diagnosis present

## 2019-06-04 DIAGNOSIS — E785 Hyperlipidemia, unspecified: Secondary | ICD-10-CM | POA: Diagnosis present

## 2019-06-04 DIAGNOSIS — I480 Paroxysmal atrial fibrillation: Secondary | ICD-10-CM | POA: Diagnosis present

## 2019-06-04 LAB — CBC WITH DIFFERENTIAL/PLATELET
Abs Immature Granulocytes: 0.07 10*3/uL (ref 0.00–0.07)
Basophils Absolute: 0 10*3/uL (ref 0.0–0.1)
Basophils Relative: 1 %
Eosinophils Absolute: 0.3 10*3/uL (ref 0.0–0.5)
Eosinophils Relative: 4 %
HCT: 33.5 % — ABNORMAL LOW (ref 39.0–52.0)
Hemoglobin: 10.4 g/dL — ABNORMAL LOW (ref 13.0–17.0)
Immature Granulocytes: 1 %
Lymphocytes Relative: 18 %
Lymphs Abs: 1.5 10*3/uL (ref 0.7–4.0)
MCH: 28.1 pg (ref 26.0–34.0)
MCHC: 31 g/dL (ref 30.0–36.0)
MCV: 90.5 fL (ref 80.0–100.0)
Monocytes Absolute: 0.7 10*3/uL (ref 0.1–1.0)
Monocytes Relative: 9 %
Neutro Abs: 5.5 10*3/uL (ref 1.7–7.7)
Neutrophils Relative %: 67 %
Platelets: 211 10*3/uL (ref 150–400)
RBC: 3.7 MIL/uL — ABNORMAL LOW (ref 4.22–5.81)
RDW: 13.9 % (ref 11.5–15.5)
WBC: 8.1 10*3/uL (ref 4.0–10.5)
nRBC: 0 % (ref 0.0–0.2)

## 2019-06-04 LAB — COMPREHENSIVE METABOLIC PANEL
ALT: 19 U/L (ref 0–44)
AST: 21 U/L (ref 15–41)
Albumin: 2.6 g/dL — ABNORMAL LOW (ref 3.5–5.0)
Alkaline Phosphatase: 67 U/L (ref 38–126)
Anion gap: 9 (ref 5–15)
BUN: 12 mg/dL (ref 8–23)
CO2: 25 mmol/L (ref 22–32)
Calcium: 9 mg/dL (ref 8.9–10.3)
Chloride: 106 mmol/L (ref 98–111)
Creatinine, Ser: 1.23 mg/dL (ref 0.61–1.24)
GFR calc Af Amer: >60 mL/min (ref >60)
GFR calc non Af Amer: 60 mL/min — ABNORMAL LOW (ref >60)
Glucose, Bld: 152 mg/dL — ABNORMAL HIGH (ref 70–99)
Potassium: 2.9 mmol/L — ABNORMAL LOW (ref 3.5–5.1)
Sodium: 140 mmol/L (ref 135–145)
Total Bilirubin: 0.9 mg/dL (ref 0.3–1.2)
Total Protein: 6.5 g/dL (ref 6.5–8.1)

## 2019-06-04 LAB — BRAIN NATRIURETIC PEPTIDE: B Natriuretic Peptide: 400 pg/mL — ABNORMAL HIGH (ref 0.0–100.0)

## 2019-06-04 LAB — CBG MONITORING, ED
Glucose-Capillary: 111 mg/dL — ABNORMAL HIGH (ref 70–99)
Glucose-Capillary: 96 mg/dL (ref 70–99)

## 2019-06-04 LAB — GLUCOSE, CAPILLARY
Glucose-Capillary: 133 mg/dL — ABNORMAL HIGH (ref 70–99)
Glucose-Capillary: 111 mg/dL — ABNORMAL HIGH (ref 70–99)

## 2019-06-04 LAB — TROPONIN I (HIGH SENSITIVITY): Troponin I (High Sensitivity): 22 ng/L — ABNORMAL HIGH (ref <18)

## 2019-06-04 LAB — TSH: TSH: 1.348 u[IU]/mL (ref 0.350–4.500)

## 2019-06-04 LAB — MAGNESIUM: Magnesium: 1.8 mg/dL (ref 1.7–2.4)

## 2019-06-04 LAB — SARS CORONAVIRUS 2 BY RT PCR (HOSPITAL ORDER, PERFORMED IN ~~LOC~~ HOSPITAL LAB): SARS Coronavirus 2: NEGATIVE

## 2019-06-04 MED ORDER — ATORVASTATIN CALCIUM 40 MG PO TABS
40.0000 mg | ORAL_TABLET | Freq: Every day | ORAL | Status: DC
  Administered 2019-06-04 – 2019-06-06 (×3): 40 mg via ORAL
  Filled 2019-06-04 (×3): qty 1

## 2019-06-04 MED ORDER — IRBESARTAN 300 MG PO TABS
300.0000 mg | ORAL_TABLET | Freq: Every day | ORAL | Status: DC
  Administered 2019-06-04 – 2019-06-07 (×4): 300 mg via ORAL
  Filled 2019-06-04 (×4): qty 1

## 2019-06-04 MED ORDER — FUROSEMIDE 10 MG/ML IJ SOLN
80.0000 mg | Freq: Two times a day (BID) | INTRAMUSCULAR | Status: DC
  Administered 2019-06-04 – 2019-06-05 (×3): 80 mg via INTRAVENOUS
  Filled 2019-06-04 (×3): qty 8

## 2019-06-04 MED ORDER — APIXABAN 5 MG PO TABS
5.0000 mg | ORAL_TABLET | Freq: Two times a day (BID) | ORAL | Status: DC
  Administered 2019-06-04 – 2019-06-07 (×7): 5 mg via ORAL
  Filled 2019-06-04 (×8): qty 1

## 2019-06-04 MED ORDER — CARVEDILOL 25 MG PO TABS
25.0000 mg | ORAL_TABLET | Freq: Two times a day (BID) | ORAL | Status: DC
  Administered 2019-06-04 – 2019-06-07 (×6): 25 mg via ORAL
  Filled 2019-06-04: qty 1
  Filled 2019-06-04: qty 2
  Filled 2019-06-04 (×3): qty 1
  Filled 2019-06-04: qty 2
  Filled 2019-06-04: qty 1

## 2019-06-04 MED ORDER — ACETAMINOPHEN 325 MG PO TABS: 650.0000 mg | ORAL_TABLET | Freq: Four times a day (QID) | ORAL | Status: DC | PRN

## 2019-06-04 MED ORDER — FUROSEMIDE 10 MG/ML IJ SOLN
40.0000 mg | Freq: Two times a day (BID) | INTRAMUSCULAR | Status: DC
  Administered 2019-06-04: 40 mg via INTRAVENOUS
  Filled 2019-06-04: qty 4

## 2019-06-04 MED ORDER — POTASSIUM CHLORIDE CRYS ER 20 MEQ PO TBCR
60.0000 meq | EXTENDED_RELEASE_TABLET | Freq: Once | ORAL | Status: AC
  Administered 2019-06-04: 60 meq via ORAL
  Filled 2019-06-04: qty 3

## 2019-06-04 MED ORDER — MAGNESIUM OXIDE 400 (241.3 MG) MG PO TABS
200.0000 mg | ORAL_TABLET | Freq: Two times a day (BID) | ORAL | Status: DC
  Administered 2019-06-04 – 2019-06-07 (×7): 200 mg via ORAL
  Filled 2019-06-04 (×7): qty 1

## 2019-06-04 MED ORDER — ACETAMINOPHEN 650 MG RE SUPP: 650.0000 mg | Freq: Four times a day (QID) | RECTAL | Status: DC | PRN

## 2019-06-04 MED ORDER — POTASSIUM CHLORIDE CRYS ER 20 MEQ PO TBCR
40.0000 meq | EXTENDED_RELEASE_TABLET | Freq: Two times a day (BID) | ORAL | Status: DC
  Administered 2019-06-04 – 2019-06-07 (×7): 40 meq via ORAL
  Filled 2019-06-04 (×7): qty 2

## 2019-06-04 MED ORDER — ALLOPURINOL 300 MG PO TABS
300.0000 mg | ORAL_TABLET | Freq: Every day | ORAL | Status: DC
  Administered 2019-06-04 – 2019-06-07 (×4): 300 mg via ORAL
  Filled 2019-06-04 (×4): qty 1

## 2019-06-04 MED ORDER — INSULIN ASPART 100 UNIT/ML ~~LOC~~ SOLN
0.0000 [IU] | Freq: Three times a day (TID) | SUBCUTANEOUS | Status: DC
  Administered 2019-06-04: 1 [IU] via SUBCUTANEOUS

## 2019-06-04 MED ORDER — AMIODARONE HCL 200 MG PO TABS
200.0000 mg | ORAL_TABLET | Freq: Every day | ORAL | Status: DC
  Administered 2019-06-04 – 2019-06-07 (×4): 200 mg via ORAL
  Filled 2019-06-04 (×4): qty 1

## 2019-06-04 MED ORDER — SPIRONOLACTONE 12.5 MG HALF TABLET
12.5000 mg | ORAL_TABLET | Freq: Every day | ORAL | Status: DC
  Administered 2019-06-04 – 2019-06-07 (×4): 12.5 mg via ORAL
  Filled 2019-06-04 (×4): qty 1

## 2019-06-04 MED ORDER — BISACODYL 5 MG PO TBEC
10.0000 mg | DELAYED_RELEASE_TABLET | Freq: Once | ORAL | Status: AC
  Administered 2019-06-05: 10 mg via ORAL
  Filled 2019-06-04 (×2): qty 2

## 2019-06-04 MED ORDER — PANTOPRAZOLE SODIUM 40 MG PO TBEC
40.0000 mg | DELAYED_RELEASE_TABLET | Freq: Every day | ORAL | Status: DC
  Administered 2019-06-04 – 2019-06-07 (×4): 40 mg via ORAL
  Filled 2019-06-04 (×4): qty 1

## 2019-06-04 NOTE — ED Notes (Addendum)
Pt placed on hospital bed for comfort at this time.

## 2019-06-04 NOTE — Progress Notes (Signed)
Patient declined HS  CPAP use>  Patient aware to call for Respiratory if he desires CPAP during hospital stay.

## 2019-06-04 NOTE — ED Notes (Signed)
Tele Ordered bfast 

## 2019-06-04 NOTE — Progress Notes (Addendum)
Patient placed in observation after midnight but care started in ER 9/8 at 11pm.   68 presented with sob related to acute on chronic systolic heart failure in setting of recent adjustments to medication. Workup also revealed hypokalemia. IV lasix started and cardiology consult requested    A/P  1. Acute on chronic systolic heart failure last EF measured in July 2020 was 30 to 35% status post AICD placement presently on Diovan beta-blockers. Home meds recently adjusted due to overdiuresis.  BNP 400. Chest xray with edema. LE pitting edema.  Lasix 40 mg IV was given will continue Lasix 40 mg IV every 12.  Closely follow metabolic panel.  Patient used to be on metolazone previously which was discontinued in June 2020. Await cards input.  2. Diabetes mellitus type 2 we will keep patient on sliding scale coverage. 3. History of paroxysmal atrial fibrillation will continue apixaban amiodarone and beta-blockers. Rate controlled.  4. Chronic kidney disease stage II-III creatinine appears to be at baseline.  Follow metabolic panel. 5. Anemia likely from renal disease. 6. Mild hypokalemia replace recheck.  Likely from diuretics. 7. Sleep apnea on CPAP. 8. History of gout on allopurinol. 9. History of CAD managed medically on beta-blockers apixaban and statins. 10. Hypkalemia. Related to above. Replete and recheck.   Dyanne Carrel, NP  Patient is still volume overloaded and needing IV lasix and further monitoring in the hospital.  Eulogio Bear DO

## 2019-06-04 NOTE — Progress Notes (Signed)
Patient currently hospitalized.

## 2019-06-04 NOTE — Consult Note (Addendum)
Cardiology Consultation:   Patient ID: [name] MRN: [id number]; DOB: Feb 27, 1951  Admit date: 06/03/2019 Date of Consult: 06/04/2019  Primary Care Provider: [name], MD Primary Cardiologist: Pixie Casino, MD  Primary Electrophysiologist:  [name], MD    Patient Profile:   Sparsh Keast Sr. is a 68 y.o. male with a hx of CAD with CTA of RCA (medically managed), VT/VF arrest, ICM s/p ICD, HTN, DM, chronic systolic CHF, HLD, PAF on eliquis and OSA who is being seen today for the evaluation of CHF at the request of Dr. Eliseo Squires.   He had out of hospital VT VF arrest December 2016 with ROSC w/ CPR and defibrillation.  Cardiac catheterization was performed by Dr. Martinique and showed single-vessel occlusive CAD with 90% ostial RCA stenosis.  The distal RCA was occluded with left to right collaterals.  The LAD and left circumflex were without disease.  Echo at that time showed moderately reduced left ventricular systolic function with an EF of 35 to 40% with grade 2 diastolic dysfunction, mild MR. An ICD was placed for secondary prevention and he was also started on  antiarrhythmic drug therapy with amiodarone.  Device interrogation also later detected atrial fibrillation and he was placed on Eliquis for anticoagulation. His most recent echo was 04/07/19. EF was 30-35%. Mild MR was noted.   He was admitted 02/2019 with AKI with creatinine of 5.92. Suspect due to hypovolemia from GI losses, ongoing diuretic use and Entresto. Discontinued Zaroxolyn and Entresto. Continue lasix 60mg  BID. Cardiology did not consulted.   He was last seen by Ellen Henri April 28, 2019 for general cardiology care.  He was noted hypertensive.  Recent device checked showed volume overload.  Per report patient was advised to increase Lasix to 40 mg twice daily however he was already on 40 twice daily dose.  There is a lot of confusion regarding his diuretic regimen.  History of Present Illness:    Mr. Hanratty reported increased shortness of breath and loss of appetite with lower extremity edema for past few weeks, worse in past few days.  He was seen in hypertensive clinic yesterday.  After walking in the hallway in clinic his breathing worsened.  Not felt he could walk to his car.  Brother was with him.  Due to worsening symptoms, he came to ER from clinic.  Patient reports compliant with medication and low-sodium diet.  He was feeling better when on higher dose of Lasix.  He was building a deck last week and was doing fine with intermittent shortness of breath.  He has lower extremity edema with orthopnea.  Denies palpitation, dizziness or syncope. His edema improved with compression stocking.   BNP 400. Hs-troponin 22. K 3.1>>>2.9. Albumin 2.6. Hgb 10.4. TSH 1.34. COVID negative.  Chest x-ray showed residual previously seen infection or asymmetric edema.  Cardiomegaly.  Given IV Lasix 40 mg x 2 so far.  Breathing minimally improved.  Supplemental potassium given.  Heart Pathway Score:     Past Medical History:  Diagnosis Date   AICD (automatic cardioverter/defibrillator) present 10/10/2016   Arthritis    "maybe in his spine" (09/05/2016)   Asthma    pt. denies   Cardiac arrest ([place]) 08/27/2016   REQUIRING CPR, SHOCK & MEDICATIONS   CHF (congestive heart failure) (HCC)    Coronary artery disease    90% ostRCA stenosis and total occlusion of dRCA with L-->R collaterals 2017   Dysrhythmia    Gout    High  cholesterol    Hypertension    Ischemic cardiomyopathy    LBBB (left bundle branch block)    OSA on CPAP    Type II diabetes mellitus ([place])     Past Surgical History:  Procedure Laterality Date   CARDIAC CATHETERIZATION N/A 09/07/2016   Procedure: Left Heart Cath and Coronary Angiography;  Surgeon: Peter M Martinique, MD;  Location: [facility];  Service: Cardiovascular;  Laterality: N/A;   COLONOSCOPY WITH PROPOFOL N/A 08/30/2018   Procedure:  COLONOSCOPY WITH PROPOFOL;  Surgeon: Carol Ada, MD;  Location: [facility];  Service: Endoscopy;  Laterality: N/A;   EP IMPLANTABLE DEVICE N/A 10/10/2016   Procedure: ICD Implant;  Surgeon: Will Meredith Leeds, MD;  Location: [facility];  Service: Cardiovascular;  Laterality: N/A;   LUMBAR LAMINECTOMY/DECOMPRESSION MICRODISCECTOMY N/A 01/18/2018   Procedure: L2-3, L3-4, L-4-5 CENTRAL DECOMPRESSION;  Surgeon: [name], MD;  Location: [facility];  Service: Orthopedics;  Laterality: N/A;   POLYPECTOMY  08/30/2018   Procedure: POLYPECTOMY;  Surgeon: Carol Ada, MD;  Location: [facility];  Service: Endoscopy;;     Inpatient Medications: Scheduled Meds:  allopurinol  300 mg Oral Daily   amiodarone  200 mg Oral Daily   apixaban  5 mg Oral BID   atorvastatin  40 mg Oral q1800   carvedilol  25 mg Oral BID WC   furosemide  40 mg Intravenous Q12H   insulin aspart  0-9 Units Subcutaneous TID WC   irbesartan  300 mg Oral Daily   magnesium oxide  200 mg Oral BID   pantoprazole  40 mg Oral Daily   potassium chloride  40 mEq Oral BID   Continuous Infusions:  PRN Meds: acetaminophen **OR** acetaminophen  Allergies:    Allergies  Allergen Reactions   Contrast Media [Iodinated Diagnostic Agents] Shortness Of Breath and Nausea And Vomiting   Lisinopril Cough    Social History:   Social History   Socioeconomic History   Marital status: Widowed    Spouse name: Not on file   Number of children: Not on file   Years of education: Not on file   Highest education level: Not on file  Occupational History   Not on file  Social Needs   Financial resource strain: Not on file   Food insecurity    Worry: Not on file    Inability: Not on file   Transportation needs    Medical: Not on file    Non-medical: Not on file  Tobacco Use   Smoking status: Never Smoker   Smokeless tobacco: Never Used  Substance and Sexual Activity   Alcohol use: No   Drug  use: No   Sexual activity: Not on file  Lifestyle   Physical activity    Days per week: Not on file    Minutes per session: Not on file   Stress: Not on file  Relationships   Social connections    Talks on phone: Not on file    Gets together: Not on file    Attends religious service: Not on file    Active member of club or organization: Not on file    Attends meetings of clubs or organizations: Not on file    Relationship status: Not on file   Intimate partner violence    Fear of current or ex partner: Not on file    Emotionally abused: Not on file    Physically abused: Not on file    Forced sexual activity:  Not on file  Other Topics Concern   Not on file  Social History Narrative   Not on file    Family History:    Family History  Problem Relation Age of Onset   Heart attack Father        died in his 25's   Hypertension Sister    Cancer Brother        uncertain, "leg"   Hypertension Sister      ROS:  Please see the history of present illness.  All other ROS reviewed and negative.     Physical Exam/Data:   Vitals:   06/04/19 0545 06/04/19 0600 06/04/19 0925 06/04/19 1200  BP: (!) 145/50 (!) 141/56 138/86 (!) 146/59  Pulse: 68 60 (!) 53 (!) 58  Resp: (!) 35 17 20 (!) 22  Temp:      SpO2: 98% 100% 100% 98%  Weight:      Height:        Intake/Output Summary (Last 24 hours) at 06/04/2019 1345 Last data filed at 06/04/2019 0938 Gross per 24 hour  Intake 213 ml  Output 810 ml  Net -597 ml   Last 3 Weights 06/03/2019 06/03/2019 05/08/2019  Weight (lbs) 260 lb 255 lb 254 lb 1.6 oz  Weight (kg) 117.935 kg 115.667 kg 115.259 kg     Body mass index is 41.16 kg/m.  General:  Well nourished, well developed, in no acute distress HEENT: normal Lymph: no adenopathy Neck: no JVD Endocrine:  No thryomegaly Vascular: No carotid bruits; FA pulses 2+ bilaterally without bruits  Cardiac:  normal S1, S2; RRR; no murmur  Lungs: Coarse breath sounds  abd: soft,  nontender, no hepatomegaly  Ext: 1-2+ BL LE  edema Musculoskeletal:  No deformities, BUE and BLE strength normal and equal Skin: warm and dry  Neuro:  CNs 2-12 intact, no focal abnormalities noted Psych:  Normal affect   EKG:  The EKG was personally reviewed and demonstrates:  NSR at rate of 72 bpm, LBBB  Relevant CV Studies:  LE arterial US 05/13/2019 Bilateral ABIs appear essentially unchanged compared to prior study on 05/2018.   Summary: Right: Resting right ankle-brachial index is within normal range. No evidence of significant right lower extremity arterial disease. The right toe-brachial index is abnormal.  Left: Resting left ankle-brachial index indicates noncompressible left lower extremity arteries.The left toe-brachial index is normal.  Echocardiogram 04/07/2019   1. The left ventricle has moderate-severely reduced systolic function, with an ejection fraction of 30-35%. The cavity size was normal. Left ventricular diastolic Doppler parameters are consistent with impaired relaxation. Left ventricular diffuse  hypokinesis.  2. The right ventricle has normal systolic function. The cavity was normal. There is no increase in right ventricular wall thickness.  3. Left atrial size was severely dilated.  4. Right atrial size was mildly dilated.  5. The mitral valve is degenerative. Mild calcification of the mitral valve leaflet. Mitral valve regurgitation is moderate by color flow Doppler.  6. The aortic valve is tricuspid. Mild calcification of the aortic valve. Aortic valve regurgitation is trivial by color flow Doppler. No stenosis of the aortic valve.  7. There is mild dilatation of the aortic root measuring 37 mm.  8. The inferior vena cava was dilated in size with >50% respiratory variability. PA systolic pressure 41 mmHg .   Cardiac catheterization 09/07/2016  Conclusion    There is moderate left ventricular systolic dysfunction.  LV end diastolic pressure is  normal.  The left ventricular ejection  fraction is 35-45% by visual estimate.  Ost RCA lesion, 90 %stenosed.  Mid RCA to Dist RCA lesion, 100 %stenosed.  1. Single vessel occlusive CAD. The distal RCA is occluded with left to right collaterals. There is a 90% ostial stenosis 2. Moderate LV dysfunction with EF 35-40%. Akinesis of the basal to mid inferior wall 3. Normal LVEDP  Plan: medical therapy. If he has significant angina we could consider PCI of the RCA but there appears to be significant scar of the inferior wall.      Coronary Diagrams  Diagnostic Dominance: Right     Laboratory Data:  High Sensitivity Troponin:   Recent Labs  Lab 05/05/19 1920 05/05/19 2045 05/06/19 0029 05/06/19 0526 06/04/19 0020  TROPONINIHS 12 27* 58* 64* 22*     Chemistry Recent Labs  Lab 06/03/19 1503 06/04/19 0402  NA 139 140  K 3.4* 2.9*  CL 106 106  CO2 22 25  GLUCOSE 124* 152*  BUN 15 12  CREATININE 1.30* 1.23  CALCIUM 9.2 9.0  GFRNONAA 56* 60*  GFRAA >60 >60  ANIONGAP 11 9    Recent Labs  Lab 06/04/19 0402  PROT 6.5  ALBUMIN 2.6*  AST 21  ALT 19  ALKPHOS 67  BILITOT 0.9   Hematology Recent Labs  Lab 06/03/19 1503 06/04/19 0402  WBC 9.4 8.1  RBC 3.88* 3.70*  HGB 10.8* 10.4*  HCT 36.0* 33.5*  MCV 92.8 90.5  MCH 27.8 28.1  MCHC 30.0 31.0  RDW 13.9 13.9  PLT 251 211   BNP Recent Labs  Lab 06/04/19 0020  BNP 400.0*    Radiology/Studies:  Dg Chest 2 View  Result Date: 06/03/2019 CLINICAL DATA:  Shortness of breath EXAM: CHEST - 2 VIEW COMPARISON:  05/05/2019 FINDINGS: Cardiomegaly with left chest single lead pacer defibrillator. Significant interval improvement of previously seen heterogeneous opacity of the right lung probable small bilateral pleural effusions. There is mild, residual predominant right-sided interstitial pulmonary opacity. There is no new or focal airspace opacity. Disc degenerative disease of the thoracic spine. IMPRESSION:  1. Significant interval improvement of previously seen heterogeneous opacity of the right lung probable small bilateral pleural effusions. There is mild, residual predominant right-sided interstitial pulmonary opacity. Findings may reflect residual of previously seen infection or asymmetric edema. 2.  Cardiomegaly. Electronically Signed   By: Eddie Candle M.D.   On: 06/03/2019 15:40    Assessment and Plan:   1. Acute on chronic systolic heart failure - BNP 400.  There is lots of confusion of his Lasix dose increment.  Currently takes Lasix 40 mg twice daily. - Recent echo 03/2019 showed EF at 30-35%. He already has an ICD for persistently low EF and VT/VT arrest.  - Previously on Entreso which was discontinued 2nd to AKi and hydration - was combined with metolazone and lasix.  - Continue Coreg 25mg  BID and Irbesartan 300mg  qd (equivalent of home dose of Valsartan 320mg  qd) - Volume overload on exam. He was feeling better on higher dose of lasix. Will increase lasix to 80mg  IV BID, likely change to PO tomorrow after noon  - Add spironolactone 12.5mg  qd - Follow renal function  2. CAD - LHC 08/2016 showed severe single vessel CAD. The distal RCA is occluded with left to right collaterals. There is a 90% ostial stenosis. Medical therapy was recommended.  - Mild chest discomfort. Hard to distinguish if its anginal equivalent or not. Hs-troponin 22. EKG without acute changes.  - Not on ASA due to  need to anticoagulation  - Continue BB and Statin  3. HLD - No results found for requested labs within last 8760 hours.  - Check lipid panel - Continue Lipitor 40mg  qd  4. PAF - Maintaining sinus rhythm.  Continue Eliquis and Coreg.  5.  History of VT/VF status post ICD -Followed by Dr. Curt Bears.  6.  Diabetes mellitus -Per primary team -On Jardiance  7.  Hypertension -Blood pressure elevated -Adjust as needed on current medication  8.  Hypokalemia -On supplement.  Follow closely with  diuresis.   For questions or updates, please contact [place] Please consult [email or web address] for contact info under   Jarrett Soho, PA  06/04/2019 1:45 PM   Patient seen, examined. Available data reviewed. Agree with findings, assessment, and plan as outlined by Robbie Lis, PA.  On my examination, the patient is alert, oriented, in no distress.  He is sitting up at the bedside on room air.  JVP is moderately elevated, lung fields are clear to auscultation bilaterally, heart is regular rate and rhythm with a grade 2/6 systolic murmur at the right upper sternal border.  Abdomen is soft, obese, nontender.  Extremities show 1+ ankle edema bilaterally.  I have reviewed the patient's clinical history.  He now presents with acute on chronic systolic heart failure manifest primarily by progressive dyspnea with exertion, currently with New York Heart Association functional class III symptoms.  His LVEF is in the range of 35% and he has an ICD in place with history of VF arrest.  The patient now has clear evidence of volume overload, but previously had significant kidney injury on a combination of Entresto and metolazone.  He seems to be tolerating his current regimen relatively well.  Will increase IV furosemide to 80 mg twice daily and consider converting him back to oral furosemide at an increased dose of 80 mg twice daily in the next 1 to 2 days.  We will also add Spironolactone 12.5 mg in the setting of severe LV dysfunction and hypokalemia.  His renal function will be monitored carefully as an outpatient.  I discussed lifestyle modification with him at length today, and specifically reviewed the importance of a low-sodium diet and fluid restriction.  He has been drinking a lot of water per his report.  With LVEF 30 to 35%, will review with Dr. Curt Bears whether the patient might be a candidate for CRT considering his left bundle branch block and heart failure.  Sherren Mocha,  M.D. 06/04/2019 3:03 PM

## 2019-06-04 NOTE — ED Notes (Signed)
Admitting Dr.Kakrakandy at bedside

## 2019-06-04 NOTE — H&P (Signed)
History and Physical    Dean Stormont Sr. KT:[id number] DOB: 02/02/1951 DOA: 06/03/2019  PCP: Dean Panda, MD  Patient coming from: Home.  Chief Complaint: Shortness of breath.  HPI: Dean Mercedes Sr. is a 68 y.o. male with history of chronic systolic heart failure last EF measured in July 2020 was 30 to 35% status post AICD placement, A. fib, chronic kidney disease stage II, diabetes mellitus, sleep apnea, anemia was admitted last month for pneumonia and exposure to chemicals presents to the ER because of worsening shortness of breath last 1 week.  Patient states his Lasix which he used to take 80 mg twice daily has been cut down by half last week by his primary care physician.  From which he has become more short of breath on exertion.  Denies any productive cough chest pain fever or chills.  Has noticed some lower extremity edema more than usual.  ED Course: In the ER chest x-ray shows improvement in aeration from previous chest x-ray done last month but does show some congestion.  BNP was around 400 high-sensitivity troponin was 22.  Potassium was 3.4 creatinine 1.3 hemoglobin 10.8.  EKG shows nonspecific IVCD.  Patient was given Lasix admitted for acute CHF exacerbation.  No definite signs of pneumonia.  Patient was afebrile COVID-19 test was negative.  Review of Systems: As per HPI, rest all negative.   Past Medical History:  Diagnosis Date  . 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 Arthritis    "maybe in his spine" (09/05/2016)  . Asthma    pt. denies  . Cardiac arrest ([place]) 08/27/2016   REQUIRING CPR, SHOCK & MEDICATIONS  . CHF (congestive heart failure) ([place])   . Coronary artery disease    90% ostRCA stenosis and total occlusion of dRCA with L-->R collaterals 2017  . Dysrhythmia   . Gout   . High cholesterol   . Hypertension   . Ischemic cardiomyopathy   . LBBB (left bundle branch block)   . OSA on CPAP   . Type II diabetes mellitus ([place])      Past Surgical History:  Procedure Laterality Date  . CARDIAC CATHETERIZATION N/A 09/07/2016   Procedure: Left Heart Cath and Coronary Angiography;  Surgeon: Peter M Martinique, MD;  Location: [place] [facility];  Service: Cardiovascular;  Laterality: N/A;  . COLONOSCOPY WITH PROPOFOL N/A 08/30/2018   Procedure: COLONOSCOPY WITH PROPOFOL;  Surgeon: Carol Ada, MD;  Location: [facility];  Service: Endoscopy;  Laterality: N/A;  . EP IMPLANTABLE DEVICE N/A 10/10/2016   Procedure: ICD Implant;  Surgeon: Will Meredith Leeds, MD;  Location: [facility] [facility];  Service: Cardiovascular;  Laterality: N/A;  . LUMBAR LAMINECTOMY/DECOMPRESSION MICRODISCECTOMY N/A 01/18/2018   Procedure: L2-3, L3-4, L-4-5 CENTRAL DECOMPRESSION;  Surgeon: [name], MD;  Location: [facility];  Service: Orthopedics;  Laterality: N/A;  . POLYPECTOMY  08/30/2018   Procedure: POLYPECTOMY;  Surgeon: Carol Ada, MD;  Location: [facility];  Service: Endoscopy;;     reports that he has never smoked. He has never used smokeless tobacco. He reports that he does not drink alcohol or use drugs.  Allergies  Allergen Reactions  . Contrast Media [Iodinated Diagnostic Agents] Shortness Of Breath and Nausea And Vomiting  . Lisinopril Cough    Family History  Problem Relation Age of Onset  . Heart attack Father        died in his 16's  . Hypertension Sister   . Cancer Brother  uncertain, "leg"  . Hypertension Sister     Prior to Admission medications   Medication Sig Start Date End Date Taking? Authorizing Provider  allopurinol (ZYLOPRIM) 300 MG tablet Take 300 mg by mouth daily. 08/16/16  Yes [provider]  amiodarone (PACERONE) 200 MG tablet Take 1 tablet (200 mg total) by mouth daily. 02/18/19  Yes Camnitz, Ocie Doyne, MD  apixaban (ELIQUIS) 5 MG TABS tablet Take 1 tablet (5 mg total) by mouth 2 (two) times daily. 12/19/18  Yes Camnitz, Will Hassell Done, MD  atorvastatin (LIPITOR) 40 MG tablet Take 1 tablet  (40 mg total) by mouth daily at 6 PM. 10/27/16  Yes Camnitz, Will Hassell Done, MD  carvedilol (COREG) 25 MG tablet Take 1 tablet (25 mg total) by mouth 2 (two) times daily with a meal. 05/31/18  Yes Duke, Tami Lin, PA  [employer] VITAMIN D3 1000 units capsule Take 1,000 Units by mouth daily. 08/16/16  Yes [provider]  empagliflozin (JARDIANCE) 25 MG TABS tablet Take 25 mg by mouth daily. 04/29/19  Yes Lyda Jester M, PA-C  furosemide (LASIX) 40 MG tablet Take 1 tablet (40 mg total) by mouth 2 (two) times daily. 05/09/19  Yes Lyda Jester M, PA-C  magnesium oxide (MAG-OX) 400 MG tablet Take 200 mg by mouth 2 (two) times daily.   Yes [provider]  pantoprazole (PROTONIX) 40 MG tablet Take 1 tablet (40 mg total) by mouth daily. 07/01/18  Yes Duke, Tami Lin, PA  valsartan (DIOVAN) 320 MG tablet Take 1 tablet (320 mg total) by mouth daily. 05/23/19  Yes Camnitz, Ocie Doyne, MD    Physical Exam: Constitutional: Moderately built and nourished. Vitals:   06/04/19 0100 06/04/19 0130 06/04/19 0200 06/04/19 0300  BP: (!) 141/52 (!) 146/51 (!) 154/48 (!) 137/42  Pulse: 61 (!) 57 (!) 59 63  Resp: (!) 24 15 (!) 26 (!) 23  Temp:      SpO2: 96% 97% 97% 98%  Weight:      Height:       Eyes: Anicteric no pallor. ENMT: No discharge from the ears eyes nose or mouth. Neck: No mass or.  No neck rigidity. Respiratory: No rhonchi or crepitations. Cardiovascular: S1-S2 heard. Abdomen: Soft nontender bowel sounds present. Musculoskeletal: Bilateral lower extremity edema present. Skin: No rash. Neurologic: Alert awake oriented to time place and person.  Moves all extremities. Psychiatric: Appears normal per normal affect.   Labs on Admission: I have personally reviewed following labs and imaging studies  CBC: Recent Labs  Lab 06/03/19 1503  WBC 9.4  HGB 10.8*  HCT 36.0*  MCV 92.8  PLT 123XX123   Basic Metabolic Panel: Recent Labs  Lab 06/03/19 1503  NA 139  K 3.4*  CL  106  CO2 22  GLUCOSE 124*  BUN 15  CREATININE 1.30*  CALCIUM 9.2   GFR: Estimated Creatinine Clearance: 64.2 mL/min (A) (by C-G formula based on SCr of 1.3 mg/dL (H)). Liver Function Tests: No results for input(s): AST, ALT, ALKPHOS, BILITOT, PROT, ALBUMIN in the last 168 hours. No results for input(s): LIPASE, AMYLASE in the last 168 hours. No results for input(s): AMMONIA in the last 168 hours. Coagulation Profile: No results for input(s): INR, PROTIME in the last 168 hours. Cardiac Enzymes: No results for input(s): CKTOTAL, CKMB, CKMBINDEX, TROPONINI in the last 168 hours. BNP (last 3 results) No results for input(s): PROBNP in the last 8760 hours. HbA1C: No results for input(s): HGBA1C in the last 72 hours. CBG: No results  for input(s): GLUCAP in the last 168 hours. Lipid Profile: No results for input(s): CHOL, HDL, LDLCALC, TRIG, CHOLHDL, LDLDIRECT in the last 72 hours. Thyroid Function Tests: No results for input(s): TSH, T4TOTAL, FREET4, T3FREE, THYROIDAB in the last 72 hours. Anemia Panel: No results for input(s): VITAMINB12, FOLATE, FERRITIN, TIBC, IRON, RETICCTPCT in the last 72 hours. Urine analysis:    Component Value Date/Time   COLORURINE YELLOW 04/06/2019 1928   APPEARANCEUR CLEAR 04/06/2019 1928   LABSPEC 1.014 04/06/2019 1928   PHURINE 6.0 04/06/2019 1928   GLUCOSEU NEGATIVE 04/06/2019 1928   HGBUR SMALL (A) 04/06/2019 [place] NEGATIVE 04/06/2019 1928   KETONESUR 5 (A) 04/06/2019 1928   PROTEINUR 30 (A) 04/06/2019 1928   NITRITE NEGATIVE 04/06/2019 1928   LEUKOCYTESUR SMALL (A) 04/06/2019 1928   Sepsis Labs: @LABRCNTIP (procalcitonin:4,lacticidven:4) ) Recent Results (from the past 240 hour(s))  SARS Coronavirus 2 [facility] order, Performed in [facility] hospital lab) Nasopharyngeal Nasopharyngeal Swab     Status: None   Collection Time: 06/04/19 12:28 AM   Specimen: Nasopharyngeal Swab  Result Value Ref Range Status   SARS Coronavirus 2  NEGATIVE NEGATIVE Final    Comment: (NOTE) If result is NEGATIVE SARS-CoV-2 target nucleic acids are NOT DETECTED. The SARS-CoV-2 RNA is generally detectable in upper and lower  respiratory specimens during the acute phase of infection. The lowest  concentration of SARS-CoV-2 viral copies this assay can detect is 250  copies / mL. A negative result does not preclude SARS-CoV-2 infection  and should not be used as the sole basis for treatment or other  patient management decisions.  A negative result may occur with  improper specimen collection / handling, submission of specimen other  than nasopharyngeal swab, presence of viral mutation(s) within the  areas targeted by this assay, and inadequate number of viral copies  (<250 copies / mL). A negative result must be combined with clinical  observations, patient history, and epidemiological information. If result is POSITIVE SARS-CoV-2 target nucleic acids are DETECTED. The SARS-CoV-2 RNA is generally detectable in upper and lower  respiratory specimens dur ing the acute phase of infection.  Positive  results are indicative of active infection with SARS-CoV-2.  Clinical  correlation with patient history and other diagnostic information is  necessary to determine patient infection status.  Positive results do  not rule out bacterial infection or co-infection with other viruses. If result is PRESUMPTIVE POSTIVE SARS-CoV-2 nucleic acids MAY BE PRESENT.   A presumptive positive result was obtained on the submitted specimen  and confirmed on repeat testing.  While 2019 novel coronavirus  (SARS-CoV-2) nucleic acids may be present in the submitted sample  additional confirmatory testing may be necessary for epidemiological  and / or clinical management purposes  to differentiate between  SARS-CoV-2 and other Sarbecovirus currently known to infect humans.  If clinically indicated additional testing with an alternate test  methodology [phone])  is advised. The SARS-CoV-2 RNA is generally  detectable in upper and lower respiratory sp ecimens during the acute  phase of infection. The expected result is Negative. Fact Sheet for Patients:  StrictlyIdeas.no Fact Sheet for Healthcare Providers: BankingDealers.co.za This test is not yet approved or cleared by the [place] FDA and has been authorized for detection and/or diagnosis of SARS-CoV-2 by FDA under an Emergency Use Authorization (EUA).  This EUA will remain in effect (meaning this test can be used) for the duration of the COVID-19 declaration under Section 564(b)(1) of the Act, 21 U.S.C. section  360bbb-3(b)(1), unless the authorization is terminated or revoked sooner. Performed at [facility], [place] [address], [place], [place] [address]      Radiological Exams on Admission: Dg Chest 2 View  Result Date: 06/03/2019 CLINICAL DATA:  Shortness of breath EXAM: CHEST - 2 VIEW COMPARISON:  05/05/2019 FINDINGS: Cardiomegaly with left chest single lead pacer defibrillator. Significant interval improvement of previously seen heterogeneous opacity of the right lung probable small bilateral pleural effusions. There is mild, residual predominant right-sided interstitial pulmonary opacity. There is no new or focal airspace opacity. Disc degenerative disease of the thoracic spine. IMPRESSION: 1. Significant interval improvement of previously seen heterogeneous opacity of the right lung probable small bilateral pleural effusions. There is mild, residual predominant right-sided interstitial pulmonary opacity. Findings may reflect residual of previously seen infection or asymmetric edema. 2.  Cardiomegaly. Electronically Signed   By: Eddie Candle M.D.   On: 06/03/2019 15:40    EKG: Independently reviewed.  Normal sinus rhythm IVCD.  Assessment/Plan Principal Problem:   Acute on chronic combined systolic and diastolic CHF (congestive heart  failure) (HCC) Active Problems:   DM2 (diabetes mellitus, type 2) (HCC)   Essential hypertension, benign   OSA (obstructive sleep apnea)   Stage 3 chronic kidney disease (HCC)   ICD (implantable cardioverter-defibrillator) in place   CHF (congestive heart failure) (HCC)   Acute systolic CHF (congestive heart failure) ([place])    1. Acute on chronic systolic heart failure last EF measured in July 2020 was 30 to 35% status post AICD placement presently on Diovan beta-blockers.  Lasix 40 mg IV was given will continue Lasix 40 mg IV every 12.  Closely follow metabolic panel.  Patient used to be on metolazone previously which was discontinued in June 2020. 2. Diabetes mellitus type 2 we will keep patient on sliding scale coverage. 3. History of paroxysmal atrial fibrillation will continue apixaban amiodarone and beta-blockers. 4. Chronic kidney disease stage II-III creatinine appears to be at baseline.  Follow metabolic panel. 5. Anemia likely from renal disease follow CBC. 6. Mild hypokalemia replace recheck.  Likely from diuretics. 7. Sleep apnea on CPAP. 8. History of gout on allopurinol. 9. History of CAD managed medically on beta-blockers apixaban and statins.   DVT prophylaxis: Apixaban. Code Status: Full code. Family Communication: Discussed with patient. Disposition Plan: Home. Consults called: None. Admission status: Observation.   Rise Patience MD [facility] Pager [phone].  If 7PM-7AM, please contact night-coverage [email or web address] Password [email or web address]  06/04/2019, 3:56 AM

## 2019-06-04 NOTE — ED Provider Notes (Signed)
TIME SEEN: 12:09 AM  CHIEF COMPLAINT: Shortness of breath  HPI: Patient is a 68 year old male with history of CAD, hypertension, hyperlipidemia, diabetes, ischemic cardiomyopathy with last echo in July 2020 showing EF of 30 to 35% and impaired relaxation status post AICD who presents to the emergency department with complaints of increasing shortness of breath.  States that he had pneumonia in July and August and has been off of antibiotics.  He denies any fevers or cough.  No chest pain or chest discomfort.  States he went to a cardiology appointment today to see the cardiologist and felt extremely short of breath walking up the stairs and walking into the office which is abnormal for him.  States that last week they decreased his Lasix to 40 mg twice daily.  He feels like he has had some increased swelling in his legs.  He does not wear oxygen at home.  He states he does not feel comfortable going home.  ROS: See HPI Constitutional: no fever  Eyes: no drainage  ENT: no runny nose   Cardiovascular:  no chest pain  Resp:  SOB  GI: no vomiting GU: no dysuria Integumentary: no rash  Allergy: no hives  Musculoskeletal: no leg swelling  Neurological: no slurred speech ROS otherwise negative  PAST MEDICAL HISTORY/PAST SURGICAL HISTORY:  Past Medical History:  Diagnosis Date  . 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 Arthritis    "maybe in his spine" (09/05/2016)  . Asthma    pt. denies  . Cardiac arrest ([place]) 08/27/2016   REQUIRING CPR, SHOCK & MEDICATIONS  . CHF (congestive heart failure) ([place])   . Coronary artery disease    90% ostRCA stenosis and total occlusion of dRCA with L-->R collaterals 2017  . Dysrhythmia   . Gout   . High cholesterol   . Hypertension   . Ischemic cardiomyopathy   . LBBB (left bundle branch block)   . OSA on CPAP   . Type II diabetes mellitus (HCC)     MEDICATIONS:  Prior to Admission medications   Medication Sig Start Date  End Date Taking? Authorizing Provider  allopurinol (ZYLOPRIM) 300 MG tablet Take 300 mg by mouth daily. 08/16/16   [provider]  amiodarone (PACERONE) 200 MG tablet Take 1 tablet (200 mg total) by mouth daily. 02/18/19   Camnitz, Ocie Doyne, MD  apixaban (ELIQUIS) 5 MG TABS tablet Take 1 tablet (5 mg total) by mouth 2 (two) times daily. 12/19/18   Camnitz, Ocie Doyne, MD  atorvastatin (LIPITOR) 40 MG tablet Take 1 tablet (40 mg total) by mouth daily at 6 PM. 10/27/16   Camnitz, Ocie Doyne, MD  carvedilol (COREG) 25 MG tablet Take 1 tablet (25 mg total) by mouth 2 (two) times daily with a meal. 05/31/18   Duke, Tami Lin, PA  CVS VITAMIN D3 1000 units capsule Take 1,000 Units by mouth daily. 08/16/16   [provider]  empagliflozin (JARDIANCE) 25 MG TABS tablet Take 25 mg by mouth daily. 04/29/19   Lyda Jester M, PA-C  furosemide (LASIX) 40 MG tablet Take 1 tablet (40 mg total) by mouth 2 (two) times daily. 05/09/19   Lyda Jester M, PA-C  magnesium oxide (MAG-OX) 400 MG tablet Take 200 mg by mouth 2 (two) times daily.    [provider]  pantoprazole (PROTONIX) 40 MG tablet Take 1 tablet (40 mg total) by mouth daily. 07/01/18   Ledora Bottcher, PA  valsartan (DIOVAN) 320 MG tablet Take 1 tablet (  320 mg total) by mouth daily. 05/23/19   Camnitz, Ocie Doyne, MD    ALLERGIES:  Allergies  Allergen Reactions  . Contrast Media [Iodinated Diagnostic Agents] Shortness Of Breath and Nausea And Vomiting  . Lisinopril Cough    SOCIAL HISTORY:  Social History   Tobacco Use  . Smoking status: Never Smoker  . Smokeless tobacco: Never Used  Substance Use Topics  . Alcohol use: No    FAMILY HISTORY: Family History  Problem Relation Age of Onset  . Heart attack Father        died in his 26's  . Hypertension Sister   . Cancer Brother        uncertain, "leg"  . Hypertension Sister     EXAM: BP (!) 175/64   Pulse 71   Temp 98.4 F (36.9 C)    Resp 18   Ht 5\' 6"  (1.676 m)   Wt 115.7 kg   SpO2 98%   BMI 41.16 kg/m  CONSTITUTIONAL: Alert and oriented and responds appropriately to questions. Well-appearing; well-nourished HEAD: Normocephalic EYES: Conjunctivae clear, pupils appear equal, EOMI ENT: normal nose; moist mucous membranes NECK: Supple, no meningismus, no nuchal rigidity, no LAD  CARD: RRR; S1 and S2 appreciated; no murmurs, no clicks, no rubs, no gallops RESP: Normal chest excursion without splinting or tachypnea; breath sounds clear and equal bilaterally; no wheezes, no rhonchi, patient has some bibasilar crackles on exam, no hypoxia or respiratory distress, speaking full sentences ABD/GI: Normal bowel sounds; non-distended; soft, non-tender, no rebound, no guarding, no peritoneal signs, no hepatosplenomegaly BACK:  The back appears normal and is non-tender to palpation, there is no CVA tenderness EXT: Normal ROM in all joints; non-tender to palpation; mild edema in bilateral lower extremities to the mid shin; normal capillary refill; no cyanosis, no calf tenderness or swelling    SKIN: Normal color for age and race; warm; no rash NEURO: Moves all extremities equally PSYCH: The patient's mood and manner are appropriate. Grooming and personal hygiene are appropriate.  MEDICAL DECISION MAKING: Patient here with CHF exacerbation.  States he had his AICD/pacemaker interrogated and was told that he was volume overloaded.  Has been taking his Lasix as prescribed.  He does not appear to be in distress and is not hypoxic but states he does not feel comfortable with the plan for IV diuresis here and going home on an increased dose of Lasix.  He feels he needs to be admitted.  Will add on troponin, BNP.  Chest x-ray shows improvement from recent pneumonia but still shows possible asymmetric edema.  Less likely infection given his clinical symptoms.  Will obtain COVID swab prior to admission.  ED PROGRESS: Patient's BNP is elevated at  400.  Troponin XX 2 which appears to be chronic for him.  COVID is negative.  Patient has no oxygen requirement but would like admission for IV diuresis.  Will discuss with hospitalist.  2:31 AM Discussed patient's case with hospitalist, Dr. Hal Hope.  I have recommended admission and patient (and family if present) agree with this plan. Admitting physician will place admission orders.   I reviewed all nursing notes, vitals, pertinent previous records, EKGs, lab and urine results, imaging (as available).     EKG Interpretation  Date/Time:  Tuesday June 03 2019 23:20:59 EDT Ventricular Rate:  72 PR Interval:    QRS Duration: 149 QT Interval:  493 QTC Calculation: 540 R Axis:   -53 Text Interpretation:  Sinus rhythm Borderline prolonged PR interval Nonspecific  IVCD with LAD Probable anterolateral infarct, old Baseline wander in lead(s) V1 No significant change since last tracing on 05/05/2019 Confirmed by Rupa Lagan, Cyril Mourning [phone]) on 06/04/2019 12:05:14 AM         Maat Kafer, Delice Bison, DO 06/04/19 0231

## 2019-06-04 NOTE — ED Notes (Signed)
Pt's lunch ordered 

## 2019-06-04 NOTE — ED Notes (Signed)
Pt. Sleeping. SpO2 drop noted while sleeping to low 80s. Pt placed on 2L O2 while sleeping. SpO2 98% on 2 L while sleeping.

## 2019-06-05 LAB — CBC
HCT: 35 % — ABNORMAL LOW (ref 39.0–52.0)
Hemoglobin: 10.8 g/dL — ABNORMAL LOW (ref 13.0–17.0)
MCH: 27.5 pg (ref 26.0–34.0)
MCHC: 30.9 g/dL (ref 30.0–36.0)
MCV: 89.1 fL (ref 80.0–100.0)
Platelets: 272 10*3/uL (ref 150–400)
RBC: 3.93 MIL/uL — ABNORMAL LOW (ref 4.22–5.81)
RDW: 14 % (ref 11.5–15.5)
WBC: 9 10*3/uL (ref 4.0–10.5)
nRBC: 0 % (ref 0.0–0.2)

## 2019-06-05 LAB — BASIC METABOLIC PANEL
Anion gap: 9 (ref 5–15)
BUN: 13 mg/dL (ref 8–23)
CO2: 26 mmol/L (ref 22–32)
Calcium: 9.7 mg/dL (ref 8.9–10.3)
Chloride: 105 mmol/L (ref 98–111)
Creatinine, Ser: 1.31 mg/dL — ABNORMAL HIGH (ref 0.61–1.24)
GFR calc Af Amer: >60 mL/min (ref >60)
GFR calc non Af Amer: 55 mL/min — ABNORMAL LOW (ref >60)
Glucose, Bld: 102 mg/dL — ABNORMAL HIGH (ref 70–99)
Potassium: 3.8 mmol/L (ref 3.5–5.1)
Sodium: 140 mmol/L (ref 135–145)

## 2019-06-05 LAB — GLUCOSE, CAPILLARY
Glucose-Capillary: 94 mg/dL (ref 70–99)
Glucose-Capillary: 102 mg/dL — ABNORMAL HIGH (ref 70–99)
Glucose-Capillary: 99 mg/dL (ref 70–99)
Glucose-Capillary: 91 mg/dL (ref 70–99)

## 2019-06-05 MED ORDER — FUROSEMIDE 10 MG/ML IJ SOLN
80.0000 mg | Freq: Two times a day (BID) | INTRAMUSCULAR | Status: DC
  Administered 2019-06-06 – 2019-06-07 (×3): 80 mg via INTRAVENOUS
  Filled 2019-06-05 (×3): qty 8

## 2019-06-05 NOTE — Progress Notes (Signed)
PROGRESS NOTE  [name] [id number] DOB: [birth year] DOA: 06/03/2019 PCP: [name], MD  Brief History    Dean Torbeck Sr. is a 68 y.o. male with history of chronic systolic heart failure last EF measured in July 2020 was 30 to 35% status post AICD placement, A. fib, chronic kidney disease stage II, diabetes mellitus, sleep apnea, anemia was admitted last month for pneumonia and exposure to chemicals presents to the ER because of worsening shortness of breath last 1 week.  Patient states his Lasix which he used to take 80 mg twice daily has been cut down by half last week by his primary care physician.  From which he has become more short of breath on exertion.  Denies any productive cough chest pain fever or chills.  Has noticed some lower extremity edema more than usual.  ED Course: In the ER chest x-ray shows improvement in aeration from previous chest x-ray done last month but does show some congestion.  BNP was around 400 high-sensitivity troponin was 22.  Potassium was 3.4 creatinine 1.3 hemoglobin 10.8.  EKG shows nonspecific IVCD.  Patient was given Lasix admitted for acute CHF exacerbation.  No definite signs of pneumonia.  Patient was afebrile COVID-19 test was negative.  Consultants  . Cardiology  Procedures  . None  Antibiotics   Anti-infectives (From admission, onward)   None    . [name]  Subjective  The patient states that he is feeling better. No new complaints.   Objective   Vitals:  Vitals:   06/05/19 0439 06/05/19 0924  BP: (!) 141/55 (!) 135/43  Pulse: (!) 56 61  Resp: 17 (!) 22  Temp: 97.7 F (36.5 C) 98.3 F (36.8 C)  SpO2: 100% 99%    Exam:  Constitutional:  . The patient is awake, alert, and oriented x 3. No acute distress. Respiratory:  . No increased work of breathing. [name] Positive for small rales at bases. No rhonchi or wheezes. . No tactile fremitus. Cardiovascular:  . Regular rate and rhythm. . No murmurs, ectopy or gallups. .  No lateral PMI. No thrills. Abdomen:  . Abdomen is soft, non-tender, non-distended. . No hernias, masses, or organomegaly . Normoactive bowel sounds. Musculoskeletal:  . No cyanosis, clubbing, or edema Skin:  . No rashes, lesions, ulcers . palpation of skin: no induration or nodules Neurologic:  . CN 2-12 intact . Sensation all 4 extremities intact Psychiatric:  . Mental status o Mood, affect appropriate o Orientation to person, place, time  . judgment and insight appear intact    I have personally reviewed the following:   Today's Data  . Vitals, BMP. CBC  Micro Data  . Blood cultures: No growth. .  Cardiology Data  . Echo 04/07/2019: EF 99991111 with diastolic dysfunction  Scheduled Meds: . allopurinol  300 mg Oral Daily  . amiodarone  200 mg Oral Daily  . apixaban  5 mg Oral BID  . atorvastatin  40 mg Oral q1800  . bisacodyl  10 mg Oral Once  . carvedilol  25 mg Oral BID WC  . furosemide  80 mg Intravenous Q12H  . insulin aspart  0-9 Units Subcutaneous TID WC  . irbesartan  300 mg Oral Daily  . magnesium oxide  200 mg Oral BID  . pantoprazole  40 mg Oral Daily  . potassium chloride  40 mEq Oral BID  . spironolactone  12.5 mg Oral Daily   Continuous Infusions:  Principal Problem:   Acute on chronic combined  systolic and diastolic CHF (congestive heart failure) (HCC) Active Problems:   DM2 (diabetes mellitus, type 2) (HCC)   Essential hypertension, benign   OSA (obstructive sleep apnea)   Stage 3 chronic kidney disease (HCC)   ICD (implantable cardioverter-defibrillator) in place   CHF (congestive heart failure) (HCC)   Acute systolic CHF (congestive heart failure) ([place])   LOS: 1 day   A & P    Acute on chronic systolic heart failure last EF measured in July 2020 was 30 to 35% status post AICD placement presently on Diovan beta-blockers. Home meds recently adjusted due to overdiuresis.  BNP 400. Chest xray with edema. LE pitting edema. Lasix 40 mg IV was  given will continue Lasix 40 mg IV every 12. Closely follow metabolic panel. Patient used to be on metolazone previously which was discontinued in June 2020. I appreciate cardiology's help.  Diabetes mellitus type 2: The patient is receiving correction insulin. In the last 24 hours glucoses have run 94 - 111.  History of paroxysmal atrial fibrillation will continue apixaban amiodarone and beta-blockers. Rate controlled. Cardiology consulted.  Chronic kidney disease stage II-III creatinine appears to be at baseline. Follow metabolic panel.  Anemia likely from renal disease. Hemoglobin is stable. Will check iron studies.  Hypokalemia: Resolved.Likely from diuretics. Monitor.  Sleep apnea on CPAP.: Continue CPAP as at home. . CAD: Managed medically on beta-blockers apixaban and statins. I appreciate cardiology's assistance.  I have seen and examined this patient myself. I have spent 32 minutes in his evaluation and care.  Yarrow Linhart, DO [facility] Direct contact: see [email or web address]  7PM-7AM contact night coverage as above 06/05/2019, 5:08 PM  LOS: 1 day

## 2019-06-05 NOTE — Progress Notes (Signed)
Progress Note  Patient Name: Dean Thwaites Sr. Date of Encounter: 06/05/2019  Primary Cardiologist: Pixie Casino, MD   Subjective   Feeling better.  No chest pain.  No dyspnea at rest.  He has not walked in the halls.  Inpatient Medications    Scheduled Meds: . allopurinol  300 mg Oral Daily  . amiodarone  200 mg Oral Daily  . apixaban  5 mg Oral BID  . atorvastatin  40 mg Oral q1800  . bisacodyl  10 mg Oral Once  . carvedilol  25 mg Oral BID WC  . furosemide  80 mg Intravenous Q12H  . insulin aspart  0-9 Units Subcutaneous TID WC  . irbesartan  300 mg Oral Daily  . magnesium oxide  200 mg Oral BID  . pantoprazole  40 mg Oral Daily  . potassium chloride  40 mEq Oral BID  . spironolactone  12.5 mg Oral Daily   Continuous Infusions:  PRN Meds: acetaminophen **OR** acetaminophen   Vital Signs    Vitals:   06/04/19 2021 06/05/19 0002 06/05/19 0439 06/05/19 0924  BP: (!) 135/55 (!) 139/50 (!) 141/55 (!) 135/43  Pulse: 60 (!) 57 (!) 56 61  Resp: 17 18 17  (!) 22  Temp: 97.9 F (36.6 C) 98 F (36.7 C) 97.7 F (36.5 C) 98.3 F (36.8 C)  TempSrc: Oral  Oral Oral  SpO2: 99% 97% 100% 99%  Weight:   116.2 kg   Height:        Intake/Output Summary (Last 24 hours) at 06/05/2019 0956 Last data filed at 06/05/2019 0926 Gross per 24 hour  Intake 980 ml  Output 1975 ml  Net -995 ml   Last 3 Weights 06/05/2019 06/04/2019 06/03/2019  Weight (lbs) 256 lb 3.2 oz 257 lb 12.8 oz 260 lb  Weight (kg) 116.212 kg 116.937 kg 117.935 kg      Telemetry    Sinus rhythm without significant arrhythmia- Personally Reviewed  Physical Exam  Alert, oriented male in no distress GEN: No acute distress.   Neck: JVP moderately elevated Cardiac: RRR, no murmurs, rubs, or gallops.  Respiratory: Clear to auscultation bilaterally. GI: Soft, nontender, non-distended  MS: 1+ pedal edema bilaterally; No deformity. Neuro:  Nonfocal  Psych: Normal affect   Labs    High Sensitivity  Troponin:   Recent Labs  Lab 06/04/19 0020  TROPONINIHS 22*      Chemistry Recent Labs  Lab 06/03/19 1503 06/04/19 0402 06/05/19 0459  NA 139 140 140  K 3.4* 2.9* 3.8  CL 106 106 105  CO2 22 25 26   GLUCOSE 124* 152* 102*  BUN 15 12 13   CREATININE 1.30* 1.23 1.31*  CALCIUM 9.2 9.0 9.7  PROT  --  6.5  --   ALBUMIN  --  2.6*  --   AST  --  21  --   ALT  --  19  --   ALKPHOS  --  67  --   BILITOT  --  0.9  --   GFRNONAA 56* 60* 55*  GFRAA >60 >60 >60  ANIONGAP 11 9 9      Hematology Recent Labs  Lab 06/03/19 1503 06/04/19 0402 06/05/19 0459  WBC 9.4 8.1 9.0  RBC 3.88* 3.70* 3.93*  HGB 10.8* 10.4* 10.8*  HCT 36.0* 33.5* 35.0*  MCV 92.8 90.5 89.1  MCH 27.8 28.1 27.5  MCHC 30.0 31.0 30.9  RDW 13.9 13.9 14.0  PLT 251 211 272    BNP Recent Labs  Lab 06/04/19 0020  BNP 400.0*     DDimer No results for input(s): DDIMER in the last 168 hours.   Radiology    Dg Chest 2 View  Result Date: 06/03/2019 CLINICAL DATA:  Shortness of breath EXAM: CHEST - 2 VIEW COMPARISON:  05/05/2019 FINDINGS: Cardiomegaly with left chest single lead pacer defibrillator. Significant interval improvement of previously seen heterogeneous opacity of the right lung probable small bilateral pleural effusions. There is mild, residual predominant right-sided interstitial pulmonary opacity. There is no new or focal airspace opacity. Disc degenerative disease of the thoracic spine. IMPRESSION: 1. Significant interval improvement of previously seen heterogeneous opacity of the right lung probable small bilateral pleural effusions. There is mild, residual predominant right-sided interstitial pulmonary opacity. Findings may reflect residual of previously seen infection or asymmetric edema. 2.  Cardiomegaly. Electronically Signed   By: Eddie Candle M.D.   On: 06/03/2019 15:40    Cardiac Studies   Echo 04/07/2019: Echocardiogram 04/07/2019  1. The left ventricle has moderate-severely reduced  systolic function, with an ejection fraction of 30-35%. The cavity size was normal. Left ventricular diastolic Doppler parameters are consistent with impaired relaxation. Left ventricular diffuse  hypokinesis. 2. The right ventricle has normal systolic function. The cavity was normal. There is no increase in right ventricular wall thickness. 3. Left atrial size was severely dilated. 4. Right atrial size was mildly dilated. 5. The mitral valve is degenerative. Mild calcification of the mitral valve leaflet. Mitral valve regurgitation is moderate by color flow Doppler. 6. The aortic valve is tricuspid. Mild calcification of the aortic valve. Aortic valve regurgitation is trivial by color flow Doppler. No stenosis of the aortic valve. 7. There is mild dilatation of the aortic root measuring 37 mm. 8. The inferior vena cava was dilated in size with >50% respiratory variability. PA systolic pressure 41 mmHg .  Patient Profile     68 y.o. male admitted with acute on chronic systolic heart failure, LVEF 30 to 35%.  Assessment & Plan    1.  Acute on chronic systolic heart failure.  Patient with modest diuresis yesterday, weight essentially unchanged.  Would continue IV furosemide 80 mg twice daily today and follow his response.  He is not markedly volume overloaded and he appears to be breathing comfortably on room air.  I have asked him to ambulate in the hallway to assess his degree of exertional dyspnea.  Hopefully transition him to furosemide 80 mg orally twice daily tomorrow.  He has been started on Spironolactone 12.5 mg daily.  As outlined in yesterday's note, his renal function will have to be followed closely since he has a history of acute renal failure.  Today his creatinine is essentially stable at 1.3 mg/dL.  We will also consider whether he might be a candidate for cardiac resynchronization therapy as an outpatient.  2.  Paroxysmal atrial fibrillation: Maintaining sinus rhythm.   Continue apixaban for anticoagulation.  For questions or updates, please contact [place] Please consult [email or web address] for contact info under     Signed, Sherren Mocha, MD  06/05/2019, 9:56 AM

## 2019-06-06 ENCOUNTER — Other Ambulatory Visit: Payer: Self-pay | Admitting: Cardiology

## 2019-06-06 DIAGNOSIS — Z79899 Other long term (current) drug therapy: Secondary | ICD-10-CM

## 2019-06-06 LAB — GLUCOSE, CAPILLARY
Glucose-Capillary: 112 mg/dL — ABNORMAL HIGH (ref 70–99)
Glucose-Capillary: 93 mg/dL (ref 70–99)
Glucose-Capillary: 107 mg/dL — ABNORMAL HIGH (ref 70–99)
Glucose-Capillary: 109 mg/dL — ABNORMAL HIGH (ref 70–99)

## 2019-06-06 LAB — IRON AND TIBC
Iron: 26 ug/dL — ABNORMAL LOW (ref 45–182)
Saturation Ratios: 11 % — ABNORMAL LOW (ref 17.9–39.5)
TIBC: 244 ug/dL — ABNORMAL LOW (ref 250–450)
UIBC: 218 ug/dL

## 2019-06-06 LAB — BASIC METABOLIC PANEL
Anion gap: 7 (ref 5–15)
BUN: 17 mg/dL (ref 8–23)
CO2: 25 mmol/L (ref 22–32)
Calcium: 9.6 mg/dL (ref 8.9–10.3)
Chloride: 106 mmol/L (ref 98–111)
Creatinine, Ser: 1.29 mg/dL — ABNORMAL HIGH (ref 0.61–1.24)
GFR calc Af Amer: >60 mL/min (ref >60)
GFR calc non Af Amer: 56 mL/min — ABNORMAL LOW (ref >60)
Glucose, Bld: 118 mg/dL — ABNORMAL HIGH (ref 70–99)
Potassium: 4 mmol/L (ref 3.5–5.1)
Sodium: 138 mmol/L (ref 135–145)

## 2019-06-06 LAB — FERRITIN: Ferritin: 170 ng/mL (ref 24–336)

## 2019-06-06 MED ORDER — SODIUM CHLORIDE 0.9 % IV SOLN
510.0000 mg | Freq: Once | INTRAVENOUS | Status: AC
  Administered 2019-06-06: 510 mg via INTRAVENOUS
  Filled 2019-06-06: qty 17

## 2019-06-06 NOTE — Plan of Care (Signed)
  Problem: Nutrition: Goal: Adequate nutrition will be maintained Outcome: Completed/Met   Problem: Coping: Goal: Level of anxiety will decrease Outcome: Completed/Met   Problem: Elimination: Goal: Will not experience complications related to bowel motility Outcome: Completed/Met Goal: Will not experience complications related to urinary retention Outcome: Completed/Met   Problem: Pain Managment: Goal: General experience of comfort will improve Outcome: Completed/Met   Problem: Skin Integrity: Goal: Risk for impaired skin integrity will decrease Outcome: Completed/Met

## 2019-06-06 NOTE — Discharge Instructions (Signed)

## 2019-06-06 NOTE — Progress Notes (Signed)
PROGRESS NOTE  Dean Bones Sr. [id number] DOB: 08-28-1951 DOA: 06/03/2019 PCP: [name], MD  Brief History    Dean Chapmon Sr. is a 68 y.o. male with history of chronic systolic heart failure last EF measured in July 2020 was 30 to 35% status post AICD placement, A. fib, chronic kidney disease stage II, diabetes mellitus, sleep apnea, anemia was admitted last month for pneumonia and exposure to chemicals presents to the ER because of worsening shortness of breath last 1 week.  Patient states his Lasix which he used to take 80 mg twice daily has been cut down by half last week by his primary care physician.  From which he has become more short of breath on exertion.  Denies any productive cough chest pain fever or chills.  Has noticed some lower extremity edema more than usual.  ED Course: In the ER chest x-ray shows improvement in aeration from previous chest x-ray done last month but does show some congestion.  BNP was around 400 high-sensitivity troponin was 22.  Potassium was 3.4 creatinine 1.3 hemoglobin 10.8.  EKG shows nonspecific IVCD.  Patient was given Lasix admitted for acute CHF exacerbation.  No definite signs of pneumonia.  Patient was afebrile COVID-19 test was negative.  Consultants  . Cardiology  Procedures  . None  Antibiotics   Anti-infectives (From admission, onward)   None    . [name]  Subjective  The patient states that he is feeling better. No new complaints.   Objective   Vitals:  Vitals:   06/05/19 1949 06/06/19 0553  BP: (!) 138/49 (!) 138/54  Pulse: 62 (!) 58  Resp: 17 18  Temp: 97.8 F (36.6 C) 98 F (36.7 C)  SpO2: 100% 100%    Exam:  Constitutional:  . The patient is awake, alert, and oriented x 3. No acute distress. Respiratory:  . No increased work of breathing. [name] Positive for small rales at bases. No rhonchi or wheezes. . No tactile fremitus. Cardiovascular:  . Regular rate and rhythm. . No murmurs, ectopy or gallups. . No  lateral PMI. No thrills. Abdomen:  . Abdomen is soft, non-tender, non-distended. . No hernias, masses, or organomegaly . Normoactive bowel sounds. Musculoskeletal:  . No cyanosis or clubbing. . +2 pitting edema of lower extremities bilaterally. Skin:  . No rashes, lesions, ulcers . palpation of skin: no induration or nodules Neurologic:  . CN 2-12 intact . Sensation all 4 extremities intact Psychiatric:  . Mental status o Mood, affect appropriate o Orientation to person, place, time  . judgment and insight appear intact    I have personally reviewed the following:   Today's Data  . Vitals, BMP, CBC  Micro Data  . Blood cultures: No growth. .  Cardiology Data  . Echo 04/07/2019: EF 99991111 with diastolic dysfunction  Scheduled Meds: . allopurinol  300 mg Oral Daily  . amiodarone  200 mg Oral Daily  . apixaban  5 mg Oral BID  . atorvastatin  40 mg Oral q1800  . carvedilol  25 mg Oral BID WC  . furosemide  80 mg Intravenous BID  . insulin aspart  0-9 Units Subcutaneous TID WC  . irbesartan  300 mg Oral Daily  . magnesium oxide  200 mg Oral BID  . pantoprazole  40 mg Oral Daily  . potassium chloride  40 mEq Oral BID  . spironolactone  12.5 mg Oral Daily   Continuous Infusions:  Principal Problem:   Acute on chronic combined systolic and  diastolic CHF (congestive heart failure) (HCC) Active Problems:   DM2 (diabetes mellitus, type 2) (HCC)   Essential hypertension, benign   OSA (obstructive sleep apnea)   Stage 3 chronic kidney disease (HCC)   ICD (implantable cardioverter-defibrillator) in place   CHF (congestive heart failure) (HCC)   Acute systolic CHF (congestive heart failure) ([place])   LOS: 2 days   A & P    Acute on chronic systolic heart failure last EF measured in July 2020 was 30 to 35% status post AICD placement presently on Diovan beta-blockers. Home meds recently adjusted due to overdiuresis.  BNP 400. Chest xray with edema. LE pitting edema. Lasix  40 mg IV was given will continue Lasix 40 mg IV every 12. Closely follow metabolic panel. Patient used to be on metolazone previously which was discontinued in June 2020. I appreciate cardiology's help. Monitor volume loss. Consider converting to oral laxis tomorrow.  Diabetes mellitus type 2: The patient is receiving correction insulin. In the last 24 hours glucoses have run 91 - 112.  History of paroxysmal atrial fibrillation will continue apixaban amiodarone and beta-blockers. Rate controlled. Cardiology consulted.  Chronic kidney disease stage II-III creatinine appears to be at baseline. Follow metabolic panel. Creatinine stable at 1.29.  Anemia likely from renal disease. Hemoglobin is stable. Will check iron studies.  Hypokalemia: Resolved.Likely from diuretics. Monitor.  Sleep apnea on CPAP.: Continue CPAP as at home. . CAD: Managed medically on beta-blockers apixaban and statins. I appreciate cardiology's assistance.  I have seen and examined this patient myself. I have spent 32 minutes in his evaluation and care.  Freeland Pracht, DO [facility] Direct contact: see [email or web address]  7PM-7AM contact night coverage as above 06/06/2019, 1:25 PM  LOS: 1 day

## 2019-06-06 NOTE — Care Management Important Message (Signed)
Important Message  Patient Details  Name: Dean Bacigalupi Sr. MRN: [id number] Date of Birth: March 27, 1951   Medicare Important Message Given:  Yes     Shelda Altes 06/06/2019, 12:21 PM

## 2019-06-06 NOTE — Progress Notes (Signed)
Pt ambulated in the hallway. HR 89. O2 sat 100%

## 2019-06-06 NOTE — Progress Notes (Signed)
Progress Note  Patient Name: Dean Marchant Sr. Date of Encounter: 06/06/2019  Primary Cardiologist: Pixie Casino, MD   Subjective   Patient is doing well today.  A little dizziness with walking.  His breathing is improved.  Inpatient Medications    Scheduled Meds: . allopurinol  300 mg Oral Daily  . amiodarone  200 mg Oral Daily  . apixaban  5 mg Oral BID  . atorvastatin  40 mg Oral q1800  . carvedilol  25 mg Oral BID WC  . furosemide  80 mg Intravenous BID  . insulin aspart  0-9 Units Subcutaneous TID WC  . irbesartan  300 mg Oral Daily  . magnesium oxide  200 mg Oral BID  . pantoprazole  40 mg Oral Daily  . potassium chloride  40 mEq Oral BID  . spironolactone  12.5 mg Oral Daily   Continuous Infusions:  PRN Meds: acetaminophen **OR** acetaminophen   Vital Signs    Vitals:   06/05/19 0439 06/05/19 0924 06/05/19 1949 06/06/19 0553  BP: (!) 141/55 (!) 135/43 (!) 138/49 (!) 138/54  Pulse: (!) 56 61 62 (!) 58  Resp: 17 (!) 22 17 18   Temp: 97.7 F (36.5 C) 98.3 F (36.8 C) 97.8 F (36.6 C) 98 F (36.7 C)  TempSrc: Oral Oral Oral Oral  SpO2: 100% 99% 100% 100%  Weight: 116.2 kg   115 kg  Height:        Intake/Output Summary (Last 24 hours) at 06/06/2019 0917 Last data filed at 06/06/2019 0850 Gross per 24 hour  Intake 1400 ml  Output 3000 ml  Net -1600 ml   Last 3 Weights 06/06/2019 06/05/2019 06/04/2019  Weight (lbs) 253 lb 9.6 oz 256 lb 3.2 oz 257 lb 12.8 oz  Weight (kg) 115.032 kg 116.212 kg 116.937 kg      Telemetry    Normal sinus rhythm- Personally Reviewed   Physical Exam  Alert, oriented male in no distress GEN: No acute distress.   Neck: No JVD Cardiac: RRR, no murmurs, rubs, or gallops.  Respiratory: Clear to auscultation bilaterally. GI: Soft, nontender, non-distended  MS:  1+ ankle/pedal edema, improving; No deformity. Neuro:  Nonfocal  Psych: Normal affect   Labs    High Sensitivity Troponin:   Recent Labs  Lab 06/04/19  0020  TROPONINIHS 22*      Chemistry Recent Labs  Lab 06/04/19 0402 06/05/19 0459 06/06/19 0413  NA 140 140 138  K 2.9* 3.8 4.0  CL 106 105 106  CO2 25 26 25   GLUCOSE 152* 102* 118*  BUN 12 13 17   CREATININE 1.23 1.31* 1.29*  CALCIUM 9.0 9.7 9.6  PROT 6.5  --   --   ALBUMIN 2.6*  --   --   AST 21  --   --   ALT 19  --   --   ALKPHOS 67  --   --   BILITOT 0.9  --   --   GFRNONAA 60* 55* 56*  GFRAA >60 >60 >60  ANIONGAP 9 9 7      Hematology Recent Labs  Lab 06/03/19 1503 06/04/19 0402 06/05/19 0459  WBC 9.4 8.1 9.0  RBC 3.88* 3.70* 3.93*  HGB 10.8* 10.4* 10.8*  HCT 36.0* 33.5* 35.0*  MCV 92.8 90.5 89.1  MCH 27.8 28.1 27.5  MCHC 30.0 31.0 30.9  RDW 13.9 13.9 14.0  PLT 251 211 272    BNP Recent Labs  Lab 06/04/19 0020  BNP 400.0*  DDimer No results for input(s): DDIMER in the last 168 hours.   Radiology    No results found.   Patient Profile     68 y.o. male admitted with acute on chronic systolic heart failure, LVEF 30 to 35%.  Assessment & Plan    1.  Acute on chronic systolic heart failure: LVEF 30 to 35%, diuresing well.  I think he can transition from intravenous to oral furosemide today.  He will receive the IV dose this morning, then change to furosemide 80 mg twice daily starting this evening.  Spironolactone 12.5 mg daily has been started.  His renal function is stable.  When he had acute renal failure a few months ago, he was taking metolazone every day.  Hopefully he will be able to tolerate the combination of furosemide and Spironolactone, but he will require close outpatient follow-up.  I would recommend having him follow in the congestive heart failure clinic and I think he should have a metabolic panel checked in about 1 week.  The patient is also on goal doses of carvedilol and an ARB.  2.  Paroxysmal atrial fibrillation: Maintaining sinus rhythm.  Continue apixaban.  Continue amiodarone.  3. CKD 3: stable  [facility] will  sign off.   Medication Recommendations:  As above, transition to PO lasix Other recommendations (labs, testing, etc):  none Follow up as an outpatient:  [facility] - will arrange, BMET one week.  For questions or updates, please contact [place] Please consult [email or web address] for contact info under     Signed, Sherren Mocha, MD  06/06/2019, 9:17 AM

## 2019-06-07 LAB — BASIC METABOLIC PANEL
Anion gap: 10 (ref 5–15)
BUN: 20 mg/dL (ref 8–23)
CO2: 25 mmol/L (ref 22–32)
Calcium: 9.9 mg/dL (ref 8.9–10.3)
Chloride: 102 mmol/L (ref 98–111)
Creatinine, Ser: 1.56 mg/dL — ABNORMAL HIGH (ref 0.61–1.24)
GFR calc Af Amer: 52 mL/min — ABNORMAL LOW (ref >60)
GFR calc non Af Amer: 45 mL/min — ABNORMAL LOW (ref >60)
Glucose, Bld: 156 mg/dL — ABNORMAL HIGH (ref 70–99)
Potassium: 4.3 mmol/L (ref 3.5–5.1)
Sodium: 137 mmol/L (ref 135–145)

## 2019-06-07 LAB — GLUCOSE, CAPILLARY
Glucose-Capillary: 119 mg/dL — ABNORMAL HIGH (ref 70–99)
Glucose-Capillary: 121 mg/dL — ABNORMAL HIGH (ref 70–99)

## 2019-06-07 MED ORDER — FUROSEMIDE 80 MG PO TABS: 80.0000 mg | ORAL_TABLET | Freq: Two times a day (BID) | ORAL | Status: DC

## 2019-06-07 MED ORDER — SPIRONOLACTONE 25 MG PO TABS: 12.5000 mg | ORAL_TABLET | Freq: Every day | ORAL | 0 refills | Status: DC

## 2019-06-07 MED ORDER — FUROSEMIDE 80 MG PO TABS: 80.0000 mg | ORAL_TABLET | Freq: Two times a day (BID) | ORAL | 0 refills | Status: DC

## 2019-06-07 MED ORDER — POTASSIUM CHLORIDE CRYS ER 20 MEQ PO TBCR: 40.0000 meq | EXTENDED_RELEASE_TABLET | Freq: Every day | ORAL | 0 refills | Status: DC

## 2019-06-07 NOTE — TOC Initial Note (Signed)
Transition of Care (TOC) - Initial/Assessment Note    Patient Details  Name: Dean Kice Sr. MRN: [id number] Date of Birth: 10/26/1950  Transition of Care [facility]) CM/SW Contact:    Zenon Mayo, RN Phone Number: 06/07/2019, 12:39 PM  Clinical Narrative:                 From home with daughter who lives with him.  He has no issues in getting his medications , he has transportation at dc.  He has no other needs.  Expected Discharge Plan: Home/Self Care Barriers to Discharge: No Barriers Identified   Patient Goals and CMS Choice Patient states their goals for this hospitalization and ongoing recovery are:: follow instructions and reduce salt intake to keep fluid down   Choice offered to / list presented to : NA  Expected Discharge Plan and Services Expected Discharge Plan: Home/Self Care In-house Referral: NA Discharge Planning Services: CM Consult Post Acute Care Choice: NA Living arrangements for the past 2 months: Single Family Home Expected Discharge Date: 06/07/19               DME Arranged: (NA)         HH Arranged: NA          Prior Living Arrangements/Services Living arrangements for the past 2 months: Single Family Home Lives with:: Adult Children Patient language and need for interpreter reviewed:: Yes Do you feel safe going back to the place where you live?: Yes      Need for Family Participation in Patient Care: Yes (Comment) Care giver support system in place?: Yes (comment)   Criminal Activity/Legal Involvement Pertinent to Current Situation/Hospitalization: No - Comment as needed  Activities of Daily Living Home Assistive Devices/Equipment: Cane (specify quad or straight), CPAP ADL Screening (condition at time of admission) Patient's cognitive ability adequate to safely complete daily activities?: Yes Is the patient deaf or have difficulty hearing?: No Does the patient have difficulty seeing, even when wearing glasses/contacts?: No Does  the patient have difficulty concentrating, remembering, or making decisions?: No Patient able to express need for assistance with ADLs?: Yes Does the patient have difficulty dressing or bathing?: No Independently performs ADLs?: Yes (appropriate for developmental age) Does the patient have difficulty walking or climbing stairs?: No Weakness of Legs: None Weakness of Arms/Hands: None  Permission Sought/Granted                  Emotional Assessment Appearance:: Appears stated age Attitude/Demeanor/Rapport: Engaged Affect (typically observed): Appropriate Orientation: : Oriented to  Time, Oriented to Situation, Oriented to Place, Oriented to Self Alcohol / Substance Use: Not Applicable Psych Involvement: No (comment)  Admission diagnosis:  Acute on chronic combined systolic and diastolic congestive heart failure (HCC) 123456 Acute systolic CHF (congestive heart failure) ([place]) [I50.21] Patient Active Problem List   Diagnosis Date Noted  . CHF (congestive heart failure) ([place]) 06/04/2019  . Acute systolic CHF (congestive heart failure) ([place]) 06/04/2019  . Sepsis ([place]) 05/06/2019  . Acute asthma exacerbation 05/06/2019  . Acute respiratory distress 05/06/2019  . Acute respiratory failure with hypoxia ([place]) 04/06/2019  . HCAP (healthcare-associated pneumonia) 04/06/2019  . Acute kidney failure ([place]) 02/27/2019  . Chronic combined systolic and diastolic CHF (congestive heart failure) ([place]) 02/27/2019  . CKD (chronic kidney disease), stage III ([place]) 05/16/2018  . Normochromic normocytic anemia 05/15/2018  . CAD (coronary artery disease) 12/17/2017  . Hyperlipidemia LDL goal <70 12/17/2017  . 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 Cardiomyopathy, ischemic   .  Debility 10/04/2016  . Stage 3 chronic kidney disease ([place])   . AKI (acute kidney injury) ([place])   . Disorientated   . Anoxic brain injury ([place]) 09/15/2016  . Benign essential HTN   . OSA  (obstructive sleep apnea)   . Transaminitis   . Chronic respiratory failure with hypoxia ([place])   . Fever   . NSVT (nonsustained ventricular tachycardia) ([place])   . Uncomplicated asthma   . Diabetes mellitus type 2 in obese ([place])   . Acute on chronic combined systolic and diastolic CHF (congestive heart failure) ([place])   . Tachypnea   . Hypokalemia   . Leukocytosis   . Hypoxemia   . Acute respiratory failure ([place])   . Cardiopulmonary arrest ([place]) 08/27/2016  . DM2 (diabetes mellitus, type 2) ([place]) 03/02/2014  . Morbid obesity ([place]) 03/02/2014  . Essential hypertension, benign 03/02/2014   PCP:  [name], MD Pharmacy:   [id number] [place], [name] [address] Phone: [phone] Fax: [phone]     Social Determinants of Health (SDOH) Interventions    Readmission Risk Interventions Readmission Risk Prevention Plan 06/07/2019  Transportation Screening Complete  PCP or Specialist Appt within 3-5 Days Complete  HRI or [place] Complete  Social Work Consult for [place] Planning/Counseling Complete  Palliative Care Screening Not Applicable  Medication Review Press photographer) Complete  Some recent data might be hidden

## 2019-06-07 NOTE — Discharge Summary (Signed)
Physician Discharge Summary  Dean Bones Sr. [id number] DOB: September 17, 1951 DOA: 06/03/2019  PCP: [name], MD  Admit date: 06/03/2019 Discharge date: 06/07/2019  Recommendations for Outpatient Follow-up:  Follow up with PCP in 7-10 days.  Follow up with CHF clinic in one week.  Have chemistry drawn in one week and reported to CHF clinic.  Follow-up Information    [facility] Follow up on 06/13/2019.   Specialty: Cardiology Why: Please come in for follow up labs. Contact information: [address] [place] [phone]         Discharge Diagnoses: Principal diagnosis is #1 1. Acute on chronic systolic CHF with EF [id number] 2. AICD 3. DMII 4. Paroxysmal atrial fibrillation 5. CKD II-III 6. 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 7. Hypokalemia 8. OSA on CPAP 9. CAD: medical management  Discharge Condition: Fair Disposition: Home  Diet recommendation: Heart healthy, carbohydrate controlled.  Filed Weights   06/05/19 0439 06/06/19 0553 06/07/19 0547  Weight: 116.2 kg 115 kg 114.4 kg    History of present illness:  Dean Yamamoto Sr. is a 68 y.o. male with history of chronic systolic heart failure last EF measured in July 2020 was 30 to 35% status post AICD placement, A. fib, chronic kidney disease stage II, diabetes mellitus, sleep apnea, anemia was admitted last month for pneumonia and exposure to chemicals presents to the ER because of worsening shortness of breath last 1 week.  Patient states his Lasix which he used to take 80 mg twice daily has been cut down by half last week by his primary care physician.  From which he has become more short of breath on exertion.  Denies any productive cough chest pain fever or chills.  Has noticed some lower extremity edema more than usual.  ED Course: In the ER chest x-ray shows improvement in aeration from previous chest x-ray done last month but does show some congestion.  BNP was  around 400 high-sensitivity troponin was 22.  Potassium was 3.4 creatinine 1.3 hemoglobin 10.8.  EKG shows nonspecific IVCD.  Patient was given Lasix admitted for acute CHF exacerbation.  No definite signs of pneumonia.  Patient was afebrile COVID-19 test was negative.  Hospital Course: The patient was admitted to a telemetry bed. Cardiology was consulted. He was diuresed. Electrolytes were followed and replaced as necessary. He was started on spironolactone. He was continued on apixaban and amiodarone. His renal status was followed with creatinine and volume status. It remained stable. He was discharged to home on 06/07/2019 in fair condition.  Today's assessment: S: The patient is resting comfortably. No new complaints. O: Vitals:  Vitals:   06/07/19 0547 06/07/19 0834  BP: (!) 148/59 138/86  Pulse: 62 66  Resp: 18 18  Temp: 98.3 F (36.8 C)   SpO2: 95% 99%   Constitutional:   The patient is awake, alert, and oriented x 3. No acute distress. Respiratory:   No increased work of breathing.  Positive for small rales at bases. No rhonchi or wheezes.  No tactile fremitus. Cardiovascular:   Regular rate and rhythm.  No murmurs, ectopy or gallups.  No lateral PMI. No thrills. Abdomen:   Abdomen is soft, non-tender, non-distended.  No hernias, masses, or organomegaly  Normoactive bowel sounds. Musculoskeletal:   No cyanosis or clubbing.  +2 pitting edema of lower extremities bilaterally. Skin:   No rashes, lesions, ulcers  palpation of skin: no induration or nodules Neurologic:   CN 2-12 intact  Sensation all  4 extremities intact Psychiatric:   Mental status ? Mood, affect appropriate ? Orientation to person, place, time   judgment and insight appear intact     Discharge Instructions  Discharge Instructions    (HEART FAILURE PATIENTS) Call MD:  Anytime you have any of the following symptoms: 1) 3 pound weight gain in 24 hours or 5 pounds in 1 week 2)  shortness of breath, with or without a dry hacking cough 3) swelling in the hands, feet or stomach 4) if you have to sleep on extra pillows at night in order to breathe.   Complete by: As directed    AMB referral to CHF clinic   Complete by: As directed    Call MD for:  difficulty breathing, headache or visual disturbances   Complete by: As directed    Call MD for:  extreme fatigue   Complete by: As directed    Call MD for:  persistant dizziness or light-headedness   Complete by: As directed    Diet - low sodium heart healthy   Complete by: As directed    Diet Carb Modified   Complete by: As directed    Discharge instructions   Complete by: As directed    Follow up with PCP in 7-10 days. Follow up with CHF clinic in one week. Have chemistry drawn in one week and reported to CHF clinic.   Heart Failure patients record your daily weight using the same scale at the same time of day   Complete by: As directed    Increase activity slowly   Complete by: As directed    STOP any activity that causes chest pain, shortness of breath, dizziness, sweating, or exessive weakness   Complete by: As directed      Allergies as of 06/07/2019      Reactions   Contrast Media [iodinated Diagnostic Agents] Shortness Of Breath, Nausea And Vomiting   Lisinopril Cough      Medication List    STOP taking these medications   Jardiance 25 MG Tabs tablet Generic drug: empagliflozin     TAKE these medications   allopurinol 300 MG tablet Commonly known as: ZYLOPRIM Take 300 mg by mouth daily.   amiodarone 200 MG tablet Commonly known as: PACERONE Take 1 tablet (200 mg total) by mouth daily.   apixaban 5 MG Tabs tablet Commonly known as: ELIQUIS Take 1 tablet (5 mg total) by mouth 2 (two) times daily.   atorvastatin 40 MG tablet Commonly known as: LIPITOR Take 1 tablet (40 mg total) by mouth daily at 6 PM.   carvedilol 25 MG tablet Commonly known as: COREG Take 1 tablet (25 mg total) by  mouth 2 (two) times daily with a meal.   [facility] Vitamin D3 25 MCG (1000 UT) capsule Generic drug: Cholecalciferol Take 1,000 Units by mouth daily.   furosemide 80 MG tablet Commonly known as: LASIX Take 1 tablet (80 mg total) by mouth 2 (two) times daily. What changed:   medication strength  how much to take  when to take this   magnesium oxide 400 MG tablet Commonly known as: MAG-OX Take 200 mg by mouth 2 (two) times daily.   pantoprazole 40 MG tablet Commonly known as: PROTONIX Take 1 tablet (40 mg total) by mouth daily.   potassium chloride SA 20 MEQ tablet Commonly known as: K-DUR Take 2 tablets (40 mEq total) by mouth daily.   spironolactone 25 MG tablet Commonly known as: ALDACTONE Take 0.5 tablets (12.5 mg  total) by mouth daily. Start taking on: June 08, 2019   valsartan 320 MG tablet Commonly known as: DIOVAN Take 1 tablet (320 mg total) by mouth daily.      Allergies  Allergen Reactions   Contrast Media [Iodinated Diagnostic Agents] Shortness Of Breath and Nausea And Vomiting   Lisinopril Cough    The results of significant diagnostics from this hospitalization (including imaging, microbiology, ancillary and laboratory) are listed below for reference.    Significant Diagnostic Studies: Dg Chest 2 View  Result Date: 06/03/2019 CLINICAL DATA:  Shortness of breath EXAM: CHEST - 2 VIEW COMPARISON:  05/05/2019 FINDINGS: Cardiomegaly with left chest single lead pacer defibrillator. Significant interval improvement of previously seen heterogeneous opacity of the right lung probable small bilateral pleural effusions. There is mild, residual predominant right-sided interstitial pulmonary opacity. There is no new or focal airspace opacity. Disc degenerative disease of the thoracic spine. IMPRESSION: 1. Significant interval improvement of previously seen heterogeneous opacity of the right lung probable small bilateral pleural effusions. There is mild, residual  predominant right-sided interstitial pulmonary opacity. Findings may reflect residual of previously seen infection or asymmetric edema. 2.  Cardiomegaly. Electronically Signed   By: Eddie Candle M.D.   On: 06/03/2019 15:40   Vas [place] Burnard Bunting With/wo Tbi  Result Date: 05/13/2019 LOWER EXTREMITY DOPPLER STUDY Indications: Patient complains of pain in both legs when walking varying              distances. He states that this is been ongoing for over a year but              does not happen every time he walks. He denies any claudication              symptoms. High Risk Factors: Hypertension, hyperlipidemia, Diabetes, no history of                    smoking, coronary artery disease.  Performing Technologist: Wilkie Aye RVT  Examination Guidelines: A complete evaluation includes at minimum, Doppler waveform signals and systolic blood pressure reading at the level of bilateral brachial, anterior tibial, and posterior tibial arteries, when vessel segments are accessible. Bilateral testing is considered an integral part of a complete examination. Photoelectric Plethysmograph (PPG) waveforms and toe systolic pressure readings are included as required and additional duplex testing as needed. Limited examinations for reoccurring indications may be performed as noted.  ABI Findings: +---------+------------------+-----+---------+--------+  Right     Rt Pressure (mmHg) Index Waveform  Comment   +---------+------------------+-----+---------+--------+  Brachial  190                                          +---------+------------------+-----+---------+--------+  ATA       208                1.08  triphasic           +---------+------------------+-----+---------+--------+  PTA       201                1.05  triphasic           +---------+------------------+-----+---------+--------+  PERO      199                1.04  triphasic           +---------+------------------+-----+---------+--------+  Great Toe 108  0.56  Normal               +---------+------------------+-----+---------+--------+ +---------+------------------+-----+---------+-------+  Left      Lt Pressure (mmHg) Index Waveform  Comment  +---------+------------------+-----+---------+-------+  Brachial  192                                         +---------+------------------+-----+---------+-------+  ATA       254                1.32  triphasic          +---------+------------------+-----+---------+-------+  PTA       249                1.30  triphasic          +---------+------------------+-----+---------+-------+  PERO      255                1.33  triphasic          +---------+------------------+-----+---------+-------+  Great Toe 136                0.71  Normal             +---------+------------------+-----+---------+-------+ +-------+-----------+-----------+------------+------------+  ABI/TBI Today's ABI Today's TBI Previous ABI Previous TBI  +-------+-----------+-----------+------------+------------+  Right   1.08        0.56        1.04                       +-------+-----------+-----------+------------+------------+  Left    Tripp          0.71        1.25                       +-------+-----------+-----------+------------+------------+ Bilateral ABIs appear essentially unchanged compared to prior study on 05/2018.  Summary: Right: Resting right ankle-brachial index is within normal range. No evidence of significant right lower extremity arterial disease. The right toe-brachial index is abnormal. Left: Resting left ankle-brachial index indicates noncompressible left lower extremity arteries.The left toe-brachial index is normal.  *See table(s) above for measurements and observations.  Electronically signed by Ena Dawley MD on 05/13/2019 at 3:49:13 PM.    Final     Microbiology: Recent Results (from the past 240 hour(s))  SARS Coronavirus 2 [facility] order, Performed in [facility] hospital lab) Nasopharyngeal Nasopharyngeal Swab     Status: None   Collection  Time: 06/04/19 12:28 AM   Specimen: Nasopharyngeal Swab  Result Value Ref Range Status   SARS Coronavirus 2 NEGATIVE NEGATIVE Final    Comment: (NOTE) If result is NEGATIVE SARS-CoV-2 target nucleic acids are NOT DETECTED. The SARS-CoV-2 RNA is generally detectable in upper and lower  respiratory specimens during the acute phase of infection. The lowest  concentration of SARS-CoV-2 viral copies this assay can detect is 250  copies / mL. A negative result does not preclude SARS-CoV-2 infection  and should not be used as the sole basis for treatment or other  patient management decisions.  A negative result may occur with  improper specimen collection / handling, submission of specimen other  than nasopharyngeal swab, presence of viral mutation(s) within the  areas targeted by this assay, and inadequate number of viral copies  (<250 copies / mL). A negative result must be combined with clinical  observations, patient  history, and epidemiological information. If result is POSITIVE SARS-CoV-2 target nucleic acids are DETECTED. The SARS-CoV-2 RNA is generally detectable in upper and lower  respiratory specimens dur ing the acute phase of infection.  Positive  results are indicative of active infection with SARS-CoV-2.  Clinical  correlation with patient history and other diagnostic information is  necessary to determine patient infection status.  Positive results do  not rule out bacterial infection or co-infection with other viruses. If result is PRESUMPTIVE POSTIVE SARS-CoV-2 nucleic acids MAY BE PRESENT.   A presumptive positive result was obtained on the submitted specimen  and confirmed on repeat testing.  While 2019 novel coronavirus  (SARS-CoV-2) nucleic acids may be present in the submitted sample  additional confirmatory testing may be necessary for epidemiological  and / or clinical management purposes  to differentiate between  SARS-CoV-2 and other Sarbecovirus currently known  to infect humans.  If clinically indicated additional testing with an alternate test  methodology [phone]) is advised. The SARS-CoV-2 RNA is generally  detectable in upper and lower respiratory sp ecimens during the acute  phase of infection. The expected result is Negative. Fact Sheet for Patients:  StrictlyIdeas.no Fact Sheet for Healthcare Providers: BankingDealers.co.za This test is not yet approved or cleared by the [place] FDA and has been authorized for detection and/or diagnosis of SARS-CoV-2 by FDA under an Emergency Use Authorization (EUA).  This EUA will remain in effect (meaning this test can be used) for the duration of the COVID-19 declaration under Section 564(b)(1) of the Act, 21 U.S.C. section 360bbb-3(b)(1), unless the authorization is terminated or revoked sooner. Performed at [facility], [place] [address] [place],  [address]      Labs: Basic Metabolic Panel: Recent Labs  Lab 06/03/19 1503 06/04/19 0402 06/05/19 0459 06/06/19 0413 06/07/19 0944  NA 139 140 140 138 137  K 3.4* 2.9* 3.8 4.0 4.3  CL 106 106 105 106 102  CO2 22 25 26 25 25   GLUCOSE 124* 152* 102* 118* 156*  BUN 15 12 13 17 20   CREATININE 1.30* 1.23 1.31* 1.29* 1.56*  CALCIUM 9.2 9.0 9.7 9.6 9.9  MG  --  1.8  --   --   --    Liver Function Tests: Recent Labs  Lab 06/04/19 0402  AST 21  ALT 19  ALKPHOS 67  BILITOT 0.9  PROT 6.5  ALBUMIN 2.6*   No results for input(s): LIPASE, AMYLASE in the last 168 hours. No results for input(s): AMMONIA in the last 168 hours. CBC: Recent Labs  Lab 06/03/19 1503 06/04/19 0402 06/05/19 0459  WBC 9.4 8.1 9.0  NEUTROABS  --  5.5  --   HGB 10.8* 10.4* 10.8*  HCT 36.0* 33.5* 35.0*  MCV 92.8 90.5 89.1  PLT 251 211 272   Cardiac Enzymes: No results for input(s): CKTOTAL, CKMB, CKMBINDEX, TROPONINI in the last 168 hours. BNP: BNP (last 3 results) Recent Labs     04/06/19 1659 05/05/19 1920 06/04/19 0020  BNP 910.1* 329.5* 400.0*    ProBNP (last 3 results) No results for input(s): PROBNP in the last 8760 hours.  CBG: Recent Labs  Lab 06/06/19 1212 06/06/19 1636 06/06/19 2117 06/07/19 0613 06/07/19 1121  GLUCAP 93 107* 109* 119* 121*    Principal Problem:   Acute on chronic combined systolic and diastolic CHF (congestive heart failure) (HCC) Active Problems:   DM2 (diabetes mellitus, type 2) (HCC)   Essential hypertension, benign   OSA (obstructive sleep apnea)  Stage 3 chronic kidney disease ([place])   ICD (implantable cardioverter-defibrillator) in place   CHF (congestive heart failure) (HCC)   Acute systolic CHF (congestive heart failure) ([place])   Time coordinating discharge: 38 minutes.  Signed:        Jourden Gilson, DO [facility]  06/07/2019, 4:18 PM

## 2019-06-10 ENCOUNTER — Telehealth: Payer: Self-pay

## 2019-06-10 NOTE — Telephone Encounter (Signed)
Left message for patient to remind of missed remote transmission.  

## 2019-06-11 ENCOUNTER — Telehealth (HOSPITAL_COMMUNITY): Payer: Self-pay

## 2019-06-11 NOTE — Telephone Encounter (Signed)
Attempted to contact PT to schedule appt per request received. Was not able to reach patient at either number on file.

## 2019-06-11 NOTE — Telephone Encounter (Signed)
Attempted to contact PT to schedule appt as per request received; however, there was no answer and PT does not have voicemail.

## 2019-06-11 NOTE — Telephone Encounter (Signed)
-----   Message from Scarlette Calico, RN sent at 06/09/2019 12:36 PM EDT ----- Regarding: FW: new eval from Dr. Tonia Ghent can you get him sch w/Amy please as a new pt for CHF put in comments post hosp per dr cooper please he was d/c'd over the weekend so early next week will be fine, thanks ----- Message ----- From: Candis Schatz Sent: 06/06/2019   9:55 AM EDT To: Scarlette Calico, RN Subject: new eval from Dr. Burt Knack                        ----- Message ----- From: Sherren Mocha, MD Sent: 06/06/2019   9:23 AM EDT To: Cheryln Manly, NP, Candis Schatz  Signed off today. Can you refer to HF Clinic as new patient, also needs BMET one week. thanks

## 2019-06-11 NOTE — Progress Notes (Signed)
No ICM remote transmission received for 06/09/2019 and next ICM transmission scheduled for 07/09/2019.

## 2019-07-02 ENCOUNTER — Ambulatory Visit (INDEPENDENT_AMBULATORY_CARE_PROVIDER_SITE_OTHER): Payer: Medicare Other | Admitting: *Deleted

## 2019-07-02 DIAGNOSIS — I509 Heart failure, unspecified: Secondary | ICD-10-CM

## 2019-07-02 DIAGNOSIS — I472 Ventricular tachycardia: Secondary | ICD-10-CM

## 2019-07-02 DIAGNOSIS — I4729 Other ventricular tachycardia: Secondary | ICD-10-CM

## 2019-07-03 LAB — CUP PACEART REMOTE DEVICE CHECK
Battery Remaining Longevity: 120 mo
Battery Voltage: 3.01 V
Brady Statistic RV Percent Paced: 0.03 %
Date Time Interrogation Session: 20201007071707
HighPow Impedance: 81 Ohm
Implantable Lead Implant Date: 20180116
Implantable Lead Location: 753860
Implantable Lead Model: 6935
Implantable Pulse Generator Implant Date: 20180116
Lead Channel Impedance Value: 266 Ohm
Lead Channel Impedance Value: 380 Ohm
Lead Channel Pacing Threshold Amplitude: 0.625 V
Lead Channel Pacing Threshold Pulse Width: 0.4 ms
Lead Channel Sensing Intrinsic Amplitude: 12.125 mV
Lead Channel Sensing Intrinsic Amplitude: 12.125 mV
Lead Channel Setting Pacing Amplitude: 2.5 V
Lead Channel Setting Pacing Pulse Width: 0.4 ms
Lead Channel Setting Sensing Sensitivity: 0.3 mV

## 2019-07-08 ENCOUNTER — Encounter (HOSPITAL_COMMUNITY): Payer: Self-pay

## 2019-07-09 ENCOUNTER — Encounter (HOSPITAL_COMMUNITY): Payer: Self-pay

## 2019-07-09 ENCOUNTER — Telehealth: Payer: Self-pay | Admitting: Cardiology

## 2019-07-09 ENCOUNTER — Ambulatory Visit (INDEPENDENT_AMBULATORY_CARE_PROVIDER_SITE_OTHER): Payer: Medicare Other

## 2019-07-09 DIAGNOSIS — I5022 Chronic systolic (congestive) heart failure: Secondary | ICD-10-CM

## 2019-07-09 DIAGNOSIS — Z95810 Presence of automatic (implantable) cardiac defibrillator: Secondary | ICD-10-CM

## 2019-07-09 NOTE — Telephone Encounter (Signed)
New message:    Patient calling concering his transmission. Patient wanted to know if it was received.

## 2019-07-09 NOTE — Progress Notes (Signed)
EPIC Encounter for ICM Monitoring  Patient Name: Arnet Michaelson Sr. is a 68 y.o. male Date: 07/09/2019 Primary Care Physican: [name], MD Primary Cardiologist:Hilty/Brittany Simmons,PA 06/03/2019 Weight: 254 lbs  Attempted call to patient and unable to reach.  Transmission reviewed.   OptivolThoracic impedancenormal.  Prescribed: Furosemide80 mg take 1 tablet (80 mg total) by mouth twice a day.  Labs: 06/07/2019 Creatinine 1.56, BUN 20, Potassium 4.3, Sodium 137, GFR 45-52 06/06/2019 Creatinine 1.29, BUN 17, Potassium 4.0, Sodium 138, GFR 56->60  06/05/2019 Creatinine 1.31, BUN 13, Potassium 3.8, Sodium 140, GFR 55->60  06/04/2019 Creatinine 1.23, BUN 12, Potassium 2.9, Sodium 140, GFR 60->60  06/03/2019 Creatinine 1.30, BUN 15, Potassium 3.4, Sodium 139, GFR 56->60  05/21/2019 Creatinine 1.58, BUN 18, Potassium 3.6, Sodium 141, GFR 44-51 05/07/2019 Creatinine1.74, BUN27, Potassium 4.4, Sodium135, [id number] 05/06/2019 Creatinine1.67, BUN17,Potassium 4.6, Sodium139, [id number]  05/05/2019 Creatinine2.02, BUN21,Potassium 4.7, Sodium138, [id number] A complete set of results can be found in Results Review.  Recommendations: Unable to reach.    Follow-up plan: ICM clinic phone appointment on 08/11/2019.   91 day device clinic remote transmission 10/01/2019.  Office appt 07/10/2019 with Dr. Debara Pickett.    Copy of ICM check sent to Dr. Rayann Heman and Dr Debara Pickett to provide update since patient has OV scheduled with him tomorrow.   3 month ICM trend: 07/09/2019    1 Year ICM trend:       Rosalene Billings, RN 07/09/2019 10:15 AM

## 2019-07-09 NOTE — Telephone Encounter (Signed)
Attempted call back on mobile phone, no answer and mail box is full.

## 2019-07-09 NOTE — Telephone Encounter (Signed)
Attempted call back to patient and no answer.

## 2019-07-10 ENCOUNTER — Telehealth: Payer: Self-pay | Admitting: Cardiology

## 2019-07-10 ENCOUNTER — Ambulatory Visit: Payer: Medicare Other | Admitting: Internal Medicine

## 2019-07-10 NOTE — Telephone Encounter (Signed)
Pt had question about "one six minute AF arrhyhtmia detected"  Pt has known PAF on Eliquis. Questions answered. No change in management. HF diagnostics stable.   Legrand Como [address]" [place], PA-C  07/10/2019 11:55 AM

## 2019-07-10 NOTE — Telephone Encounter (Signed)
Patient is calling about his results to his home remote [place]. He would like someone to explain it to him.

## 2019-07-11 NOTE — Progress Notes (Signed)
Spoke with patient.  Transmission reviewed and explained fluid levels are normal.  He said he is feeling good and denies any fluid symptoms.  Weight has decreased to 247 lbs.  He received call back from [place], [name] regarding remote transmission report showing Afib.  He has not further questions.  Reminded him of upcoming visit with Dr Ashok Croon on 10/20.  No changes and encouraged to call if experiencing any fluid symptoms.  Next ICM remote transmission 08/11/2019.

## 2019-07-11 NOTE — Progress Notes (Signed)
Remote ICD transmission.   

## 2019-07-15 ENCOUNTER — Telehealth (INDEPENDENT_AMBULATORY_CARE_PROVIDER_SITE_OTHER): Payer: Medicare Other | Admitting: Internal Medicine

## 2019-07-15 ENCOUNTER — Encounter: Payer: Self-pay | Admitting: Internal Medicine

## 2019-07-15 VITALS — BP 129/52 | HR 60 | Ht 66.0 in | Wt 245.0 lb

## 2019-07-15 DIAGNOSIS — I5043 Acute on chronic combined systolic (congestive) and diastolic (congestive) heart failure: Secondary | ICD-10-CM

## 2019-07-15 DIAGNOSIS — N183 Chronic kidney disease, stage 3 unspecified: Secondary | ICD-10-CM | POA: Diagnosis not present

## 2019-07-15 DIAGNOSIS — I10 Essential (primary) hypertension: Secondary | ICD-10-CM

## 2019-07-15 DIAGNOSIS — Z95810 Presence of automatic (implantable) cardiac defibrillator: Secondary | ICD-10-CM

## 2019-07-15 DIAGNOSIS — I255 Ischemic cardiomyopathy: Secondary | ICD-10-CM

## 2019-07-15 DIAGNOSIS — E785 Hyperlipidemia, unspecified: Secondary | ICD-10-CM

## 2019-07-15 MED ORDER — VALSARTAN 160 MG PO TABS: 160.0000 mg | ORAL_TABLET | Freq: Every day | ORAL | 3 refills | Status: DC

## 2019-07-15 NOTE — Progress Notes (Signed)
Virtual Visit via Telephone Note   This visit type was conducted due to national recommendations for restrictions regarding the COVID-19 Pandemic (e.g. social distancing) in an effort to limit this patient's exposure and mitigate transmission in our community.  Due to his co-morbid illnesses, this patient is at least at moderate risk for complications without adequate follow up.  This format is felt to be most appropriate for this patient at this time.  The patient did not have access to video technology/had technical difficulties with video requiring transitioning to audio format only (telephone).  All issues noted in this document were discussed and addressed.  No physical exam could be performed with this format.  Please refer to the patient's chart for his  consent to telehealth for [name].   Evaluation Performed:  Telephone follow-up  Date:  07/15/2019   ID:  Dean Bones Sr., DOB July 24, 1951, MRN [id number]  Patient Location:  [address] [name] [address]  Provider location:   [address] [place], [place] [address]  PCP:  [name], MD  Cardiologist:  Pixie Casino, MD Electrophysiologist:  Will Meredith Leeds, MD   Chief Complaint: No complaints  History of Present Illness:    Dean Hardgrove Sr. is a 68 y.o. male who presents via audio/video conferencing for a telehealth visit today.  Mr. Grunewald was seen today for telephone follow-up.  He declined an in office follow-up but recently was hospitalized for acute on chronic systolic congestive heart failure.  His LVEF is about 30 to 35% he has a known left bundle branch block and is status post AICD.  I first met him as a new cardiology patient in December 2017. He had a VT or VF arrest requiring CPR, defibrillation and medications.  He also has obstructive sleep apnea, hypertension, dyslipidemia and type 2 diabetes.  Since then he has had a number of hospitalization is been managed by my  partners and I have not seen him back in the office.  More recently, he was hospitalized in September apparently after his PCP a cut back some of his diuretics possibly due to worsening renal function.  His device impedance trends have shown an increase suggesting volume overload.  He was also short of breath.  He required IV diuresis and was placed on Aldactone in addition to his Lasix.  He had been transitioned over to valsartan 320 mg daily however he reports that he is felt somewhat fatigued since being on that medicine over the past year.  Of note his diastolic blood pressures are somewhat low around 50.  Since discharge though he has felt better with regards to heart failure.  His discharge weight was 250 pounds and now he is at 245 pounds.  He denies any worsening fluid overload or edema.  He also has paroxysmal atrial fibrillation and has had some short runs of A. fib that have been picked up on his Linq and device, however he is on amiodarone and was in sinus rhythm while hospitalized.  The patient does not have symptoms concerning for COVID-19 infection (fever, chills, cough, or new SHORTNESS OF BREATH).    Prior CV studies:   The following studies were reviewed today:  Lab work Chart reviewed  PMHx:  Past Medical History:  Diagnosis Date  . 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 Arthritis    "maybe in his spine" (09/05/2016)  . Asthma    pt. denies  . Cardiac arrest ([place]) 08/27/2016   REQUIRING CPR,  SHOCK & MEDICATIONS  . CHF (congestive heart failure) ([place])   . Coronary artery disease    90% ostRCA stenosis and total occlusion of dRCA with L-->R collaterals 2017  . Dysrhythmia   . Gout   . High cholesterol   . Hypertension   . Ischemic cardiomyopathy   . LBBB (left bundle branch block)   . OSA on CPAP   . Type II diabetes mellitus ([place])     Past Surgical History:  Procedure Laterality Date  . CARDIAC CATHETERIZATION N/A 09/07/2016    Procedure: Left Heart Cath and Coronary Angiography;  Surgeon: Peter M Martinique, MD;  Location: [place] [facility];  Service: Cardiovascular;  Laterality: N/A;  . COLONOSCOPY WITH PROPOFOL N/A 08/30/2018   Procedure: COLONOSCOPY WITH PROPOFOL;  Surgeon: Carol Ada, MD;  Location: [facility];  Service: Endoscopy;  Laterality: N/A;  . EP IMPLANTABLE DEVICE N/A 10/10/2016   Procedure: ICD Implant;  Surgeon: Will Meredith Leeds, MD;  Location: [place] [facility];  Service: Cardiovascular;  Laterality: N/A;  . LUMBAR LAMINECTOMY/DECOMPRESSION MICRODISCECTOMY N/A 01/18/2018   Procedure: L2-3, L3-4, L-4-5 CENTRAL DECOMPRESSION;  Surgeon: [name], MD;  Location: [facility];  Service: Orthopedics;  Laterality: N/A;  . POLYPECTOMY  08/30/2018   Procedure: POLYPECTOMY;  Surgeon: Carol Ada, MD;  Location: [facility];  Service: Endoscopy;;    FAMHx:  Family History  Problem Relation Age of Onset  . Heart attack Father        died in his 46's  . Hypertension Sister   . Cancer Brother        uncertain, "leg"  . Hypertension Sister     SOCHx:   reports that he has never smoked. He has never used smokeless tobacco. He reports that he does not drink alcohol or use drugs.  ALLERGIES:  Allergies  Allergen Reactions  . Contrast Media [Iodinated Diagnostic Agents] Shortness Of Breath and Nausea And Vomiting  . Lisinopril Cough    MEDS:  Current Meds  Medication Sig  . allopurinol (ZYLOPRIM) 300 MG tablet Take 300 mg by mouth daily.  [name] amiodarone (PACERONE) 200 MG tablet Take 1 tablet (200 mg total) by mouth daily.  [name] apixaban (ELIQUIS) 5 MG TABS tablet Take 1 tablet (5 mg total) by mouth 2 (two) times daily.  [name] atorvastatin (LIPITOR) 40 MG tablet Take 1 tablet (40 mg total) by mouth daily at 6 PM.  . carvedilol (COREG) 25 MG tablet Take 1 tablet (25 mg total) by mouth 2 (two) times daily with a meal.  . CVS VITAMIN D3 1000 units capsule Take 1,000 Units by mouth daily.  . furosemide (LASIX)  80 MG tablet Take 1 tablet (80 mg total) by mouth 2 (two) times daily.  . magnesium oxide (MAG-OX) 400 MG tablet Take 200 mg by mouth 2 (two) times daily.  . pantoprazole (PROTONIX) 40 MG tablet Take 1 tablet (40 mg total) by mouth daily.  . potassium chloride SA (K-DUR) 20 MEQ tablet Take 2 tablets (40 mEq total) by mouth daily.  [name] spironolactone (ALDACTONE) 25 MG tablet Take 0.5 tablets (12.5 mg total) by mouth daily.  . valsartan (DIOVAN) 160 MG tablet Take 1 tablet (160 mg total) by mouth daily.  . [DISCONTINUED] valsartan (DIOVAN) 320 MG tablet Take 1 tablet (320 mg total) by mouth daily.     ROS: Pertinent items noted in HPI and remainder of comprehensive ROS otherwise negative.  Labs/Other Tests and Data Reviewed:    Recent Labs: 06/04/2019: ALT 19;  B Natriuretic Peptide 400.0; Magnesium 1.8; TSH 1.348 06/05/2019: Hemoglobin 10.8; Platelets 272 06/07/2019: BUN 20; Creatinine, Ser 1.56; Potassium 4.3; Sodium 137   Recent Lipid Panel Lab Results  Component Value Date/Time   CHOL 159 09/07/2016 08:44 AM   TRIG 146 09/07/2016 08:44 AM   HDL 25 (L) 09/07/2016 08:44 AM   CHOLHDL 6.4 09/07/2016 08:44 AM   LDLCALC 105 (H) 09/07/2016 08:44 AM    Wt Readings from Last 3 Encounters:  07/15/19 245 lb (111.1 kg)  06/07/19 252 lb 3.2 oz (114.4 kg)  06/03/19 260 lb (117.9 kg)     Exam:    Vital Signs:  BP (!) 129/52   Pulse 60   Ht 5' 6" (1.676 m)   Wt 245 lb (111.1 kg)   BMI 39.54 kg/m    Exam not performed due to telephone visit.  ASSESSMENT & PLAN:    1. Acute on chronic systolic congestive heart failure, LVEF 30 to 35%, NYHA class II symptoms 2. History of VT/VF arrest status post AICD 3. 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 OSA on CPAP 6. Hypertension 7. Dyslipidemia 8. Type 2 diabetes 9. Chronic anemia  Mr. Doane was recently admitted for acute on chronic systolic congestive heart failure.  This has improved with the discharge dry weight of 250 pounds,  now down to 245 pounds.  Over the past year he has felt more fatigued and he might attribute this to his valsartan hand.  He is on the high dose and is noted to have a persistently low diastolic blood pressure.  I recommend we decrease that down to 160 mg daily.  Hopefully this will help him somewhat.  He appears to be predominantly in sinus rhythm and is on amiodarone.  He may remains anemic related to his chronic kidney disease although is on anticoagulation.  He will need close clinic follow-up and recommend that be in 3 months.  COVID-19 Education: The signs and symptoms of COVID-19 were discussed with the patient and how to seek care for testing (follow up with PCP or arrange E-visit).  The importance of social distancing was discussed today.  Patient Risk:   After full review of this patients clinical status, I feel that they are at least moderate risk at this time.  Time:   Today, I have spent 25 minutes with the patient with telehealth technology discussing heart failure, PAF, chronic kidney disease, hypertension, dyslipidemia.     Medication Adjustments/Labs and Tests Ordered: Current medicines are reviewed at length with the patient today.  Concerns regarding medicines are outlined above.   Tests Ordered: No orders of the defined types were placed in this encounter.   Medication Changes: Meds ordered this encounter  Medications  . valsartan (DIOVAN) 160 MG tablet    Sig: Take 1 tablet (160 mg total) by mouth daily.    Dispense:  90 tablet    Refill:  3    Hold until patient requests refill. Thanks    Disposition:  in 3 month(s)   C. , MD, FACC, FACP  [place]  [facility]  Medical Director of the [facility] Lipid [facility] &  Cardiovascular Risk Reduction Clinic Diplomate of the American Board of Clinical Lipidology Attending Cardiologist  Direct Dial: [phone]  Fax: [phone]  Website:  [email or web address]   C , MD  07/15/2019  9:30 AM      

## 2019-07-15 NOTE — Patient Instructions (Addendum)
Medication Instructions:  DECREASE valsartan to 160mg  daily - cut current tablets in half -- a new prescription has been sent to the pharmacy for the decreased dose Continue other current medications *If you need a refill on your cardiac medications before your next appointment, please call your pharmacy*  Follow-Up: At [facility], you and your health needs are our priority.  As part of our continuing mission to provide you with exceptional heart care, we have created designated Provider Care Teams.  These Care Teams include your primary Cardiologist (physician) and Advanced Practice Providers (APPs -  Physician Assistants and Nurse Practitioners) who all work together to provide you with the care you need, when you need it.  Your next appointment:   3 months  The format for your next appointment:   In Person  Provider:   Dr. Lyman Bishop  Other Instructions

## 2019-08-11 ENCOUNTER — Telehealth: Payer: Self-pay

## 2019-08-11 ENCOUNTER — Ambulatory Visit (INDEPENDENT_AMBULATORY_CARE_PROVIDER_SITE_OTHER): Payer: Medicare Other

## 2019-08-11 DIAGNOSIS — I5022 Chronic systolic (congestive) heart failure: Secondary | ICD-10-CM

## 2019-08-11 DIAGNOSIS — Z95810 Presence of automatic (implantable) cardiac defibrillator: Secondary | ICD-10-CM | POA: Diagnosis not present

## 2019-08-11 NOTE — Telephone Encounter (Signed)
Remote ICM transmission received.  Attempted call to patient regarding ICM remote transmission and no answer or voice mail box. 

## 2019-08-11 NOTE — Progress Notes (Signed)
EPIC Encounter for ICM Monitoring  Patient Name: Dean Torrez Sr. is a 68 y.o. male Date: 08/11/2019 Primary Care Physican: [name], MD Primary Cardiologist:Hilty/Brittany Murphy [facility] Electrophysiologist:[place] 06/03/2019 Weight: 254 lbs  Attempted call to patient and unable to reach.  Transmission reviewed.   OptivolThoracic impedancesuggesting possible ongoing fluid accumulation since 07/27/2019.  Prescribed: Furosemide80 mg take 1 tablet (80 mg total) by mouth twice a day.Potassium 20 mEq take 2 tablets (40 mEq total) daily.  Labs: 06/07/2019 Creatinine 1.56, BUN 20, Potassium 4.3, Sodium 137, GFR 45-52 06/06/2019 Creatinine 1.29, BUN 17, Potassium 4.0, Sodium 138, GFR 56->60  06/05/2019 Creatinine 1.31, BUN 13, Potassium 3.8, Sodium 140, GFR 55->60  06/04/2019 Creatinine 1.23, BUN 12, Potassium 2.9, Sodium 140, GFR 60->60  06/03/2019 Creatinine 1.30, BUN 15, Potassium 3.4, Sodium 139, GFR 56->60  05/21/2019 Creatinine 1.58, BUN 18, Potassium 3.6, Sodium 141, GFR 44-51 05/07/2019 Creatinine1.74, BUN27, Potassium 4.4, Sodium135, [id number] 05/06/2019 Creatinine1.67, BUN17,Potassium 4.6, Sodium139, [id number]  05/05/2019 Creatinine2.02, BUN21,Potassium 4.7, Sodium138, [id number] A complete set of results can be found in Results Review.  Recommendations: Unable to reach.    Follow-up plan: ICM clinic phone appointment on 08/18/2019.   91 day device clinic remote transmission 10/01/2019.    Copy of ICM check sent to Dr. Curt Bears and Dr Debara Pickett.   3 month ICM trend: 08/11/2019    1 Year ICM trend:       Rosalene Billings, RN 08/11/2019 4:36 PM

## 2019-08-18 ENCOUNTER — Ambulatory Visit (INDEPENDENT_AMBULATORY_CARE_PROVIDER_SITE_OTHER): Payer: Medicare Other

## 2019-08-18 ENCOUNTER — Telehealth: Payer: Self-pay

## 2019-08-18 DIAGNOSIS — I5022 Chronic systolic (congestive) heart failure: Secondary | ICD-10-CM

## 2019-08-18 DIAGNOSIS — Z95810 Presence of automatic (implantable) cardiac defibrillator: Secondary | ICD-10-CM

## 2019-08-18 NOTE — Progress Notes (Signed)
EPIC Encounter for ICM Monitoring  Patient Name: Dean Moravec Sr. is a 68 y.o. male Date: 08/18/2019 Primary Care Physican: [name], MD Primary Cardiologist:Hilty/Brittany [facility] Electrophysiologist:Camnitz 06/03/2019 Weight: 254 lbs  Attempted call to patient and unable to reach. Transmission reviewed.  OptivolThoracic impedancesuggesting possible ongoing fluid accumulation since 08/06/2019.  Prescribed: Furosemide80 mg take 1 tablet (80 mg total) by mouth twice a day.Potassium 20 mEq take 2 tablets (40 mEq total) daily.  Labs: 06/07/2019 Creatinine1.56, Jari Sportsman, Potassium4.3, [id number], [id number] 06/06/2019 Creatinine1.29, BUN17, Potassium4.0, [id number], GFR56->60  06/05/2019 Creatinine1.31, BUN13, Potassium3.8, Sodium140, GFR55->60  06/04/2019 Creatinine1.23, BUN12, Potassium2.9, Sodium140, GFR60->60  06/03/2019 Creatinine1.30, BUN15, Potassium3.4, Sodium139, GFR56->60 05/21/2019 Creatinine 1.58, BUN 18, Potassium 3.6, Sodium 141, GFR 44-51 05/07/2019 Creatinine1.74, BUN27, Potassium 4.4, Sodium135, [id number] 05/06/2019 Creatinine1.67, BUN17,Potassium 4.6, Sodium139, [id number]  05/05/2019 Creatinine2.02, BUN21,Potassium 4.7, Sodium138, [id number] A complete set of results can be found in Results Review.  Recommendations: Unable to reach.    Follow-up plan: ICM clinic phone appointment on 08/25/2019 (manual send)to recheck fluid levels.   91 day device clinic remote transmission 10/01/2019.      Copy of ICM check sent to Dr. Curt Bears and Dr Debara Pickett.   3 month ICM trend: 08/18/2019    1 Year ICM trend:       Rosalene Billings, RN 08/18/2019 2:32 PM

## 2019-08-18 NOTE — Telephone Encounter (Signed)
Remote ICM transmission received.  Attempted call to patient regarding ICM remote transmission and no answer or voice mail box. 

## 2019-08-26 ENCOUNTER — Telehealth: Payer: Self-pay

## 2019-08-26 NOTE — Telephone Encounter (Signed)
Left message for patient to remind of missed remote transmission.  

## 2019-09-01 NOTE — Progress Notes (Signed)
No ICM remote transmission received for 08/25/2019 and next ICM transmission scheduled for 10/02/2019.

## 2019-09-08 ENCOUNTER — Other Ambulatory Visit: Payer: Self-pay

## 2019-09-08 ENCOUNTER — Ambulatory Visit (INDEPENDENT_AMBULATORY_CARE_PROVIDER_SITE_OTHER)
Admission: RE | Admit: 2019-09-08 | Discharge: 2019-09-08 | Disposition: A | Discharge status: Home / Self Care | Payer: Medicare Other | Source: Ambulatory Visit | Attending: Pulmonary Disease | Admitting: Pulmonary Disease

## 2019-09-08 DIAGNOSIS — J849 Interstitial pulmonary disease, unspecified: Secondary | ICD-10-CM

## 2019-09-09 ENCOUNTER — Other Ambulatory Visit: Payer: Self-pay | Admitting: Cardiology

## 2019-09-09 NOTE — Telephone Encounter (Signed)
46m 117.9kg Scr1.56 06/07/19 Lovw/hilty 07/15/19

## 2019-10-01 ENCOUNTER — Ambulatory Visit (INDEPENDENT_AMBULATORY_CARE_PROVIDER_SITE_OTHER): Payer: Medicare (Managed Care) | Admitting: *Deleted

## 2019-10-01 DIAGNOSIS — I509 Heart failure, unspecified: Secondary | ICD-10-CM

## 2019-10-02 LAB — CUP PACEART REMOTE DEVICE CHECK
Battery Remaining Longevity: 118 mo
Battery Voltage: 3 V
Brady Statistic RV Percent Paced: 0.06 %
Date Time Interrogation Session: 20210106163727
HighPow Impedance: 79 Ohm
Implantable Lead Implant Date: 20180116
Implantable Lead Location: 753860
Implantable Lead Model: 6935
Implantable Pulse Generator Implant Date: 20180116
Lead Channel Impedance Value: 266 Ohm
Lead Channel Impedance Value: 380 Ohm
Lead Channel Pacing Threshold Amplitude: 0.625 V
Lead Channel Pacing Threshold Pulse Width: 0.4 ms
Lead Channel Sensing Intrinsic Amplitude: 10.75 mV
Lead Channel Sensing Intrinsic Amplitude: 10.75 mV
Lead Channel Setting Pacing Amplitude: 2.5 V
Lead Channel Setting Pacing Pulse Width: 0.4 ms
Lead Channel Setting Sensing Sensitivity: 0.3 mV

## 2019-10-10 ENCOUNTER — Other Ambulatory Visit: Payer: Self-pay

## 2019-10-10 ENCOUNTER — Encounter: Payer: Self-pay | Admitting: Pulmonary Disease

## 2019-10-10 ENCOUNTER — Ambulatory Visit (INDEPENDENT_AMBULATORY_CARE_PROVIDER_SITE_OTHER): Payer: Medicare (Managed Care) | Admitting: Pulmonary Disease

## 2019-10-10 VITALS — BP 126/70 | HR 70 | Temp 98.2°F | Ht 66.0 in | Wt 256.0 lb

## 2019-10-10 DIAGNOSIS — R0602 Shortness of breath: Secondary | ICD-10-CM | POA: Diagnosis not present

## 2019-10-10 NOTE — Patient Instructions (Signed)
I am glad you are feeling well with regard to your breathing Your CT scan shows very mild changes which do not need any intervention for now Follow-up in 1 year.

## 2019-10-10 NOTE — Progress Notes (Signed)
Dean Norred Sr.    [id number]    Oct 25, 1950  Primary Care Physician:Moreira, Carloyn Manner, MD  Referring Physician: [name], MD [id number] [place],  [place] [address]  Chief complaint: Follow up for abnormal PFTs  HPI: 69 y/o HTN, chronic combined systolic and diastolic heart failure, ischenic cardiomyopathy, EF of 35%-40% with akinesis and mid basal inferior and apical myocardium with Grade 2 diastolic dysfunction, CAD with CTO of RCA, NSVT, and CKD stage III.VT/V. fib status post ICD, chronic systolic heart failure,hyperlipidemia, hypertension, diabetes, and OSA on CPAP.   He is being monitored while on amiodarone therapy.  He had PFTs this month which showed restriction and diffusion impairment and has been referred here for further evaluation. He has complains of nonproductive, dyspnea on exertion.  Pets: No pets Occupation: Used to work in [employer] Exposures: Has remote exposure to asbestos and mold Smoking history: Never smoker Travel history: No significant travel history Relevant family history: No significant history of lung disease.   Interim history: States that breathing is doing well with no issues.  Denies any cough, dyspnea Here for review of his CT scan.  Outpatient Encounter Medications as of 10/10/2019  Medication Sig  . allopurinol (ZYLOPRIM) 300 MG tablet Take 300 mg by mouth daily.  [name] amiodarone (PACERONE) 200 MG tablet Take 1 tablet (200 mg total) by mouth daily.  [name] atorvastatin (LIPITOR) 40 MG tablet Take 1 tablet (40 mg total) by mouth daily at 6 PM.  . carvedilol (COREG) 25 MG tablet Take 1 tablet (25 mg total) by mouth 2 (two) times daily with a meal.  . CVS VITAMIN D3 1000 units capsule Take 1,000 Units by mouth daily.  [name] ELIQUIS 5 MG TABS tablet TAKE 1 TABLET BY MOUTH TWICE A DAY  . furosemide (LASIX) 80 MG tablet Take 1 tablet (80 mg total) by mouth 2 (two) times daily.  . magnesium oxide (MAG-OX) 400 MG tablet Take 200 mg by mouth  2 (two) times daily.  . pantoprazole (PROTONIX) 40 MG tablet Take 1 tablet (40 mg total) by mouth daily.  . potassium chloride SA (K-DUR) 20 MEQ tablet Take 2 tablets (40 mEq total) by mouth daily.  [name] spironolactone (ALDACTONE) 25 MG tablet Take 0.5 tablets (12.5 mg total) by mouth daily.  . valsartan (DIOVAN) 160 MG tablet Take 1 tablet (160 mg total) by mouth daily.   No facility-administered encounter medications on file as of 10/10/2019.   Physical Exam: Blood pressure 126/70, pulse 70, temperature 98.2 F (36.8 C), temperature source Temporal, height 5\' 6"  (1.676 m), weight 256 lb (116.1 kg), SpO2 100 %. Gen:      No acute distress HEENT:  EOMI, sclera anicteric Neck:     No masses; no thyromegaly Lungs:    Clear to auscultation bilaterally; normal respiratory effort CV:         Regular rate and rhythm; no murmurs Abd:      + bowel sounds; soft, non-tender; no palpable masses, no distension Ext:    No edema; adequate peripheral perfusion Skin:      Warm and dry; no rash Neuro: alert and oriented x 3 Psych: normal mood and affect  Data Reviewed: Imaging: CT chest 08/27/2016-no pulmonary embolism, basilar pulmonary collapse/consolidation, aortic atherosclerosis  High-resolution CT chest 08/12/2018-Very mild groundglass attenuation with septal thickening bilaterally.  Left main and three-vessel coronary artery disease.  Calcification of the aortic wall.  Multiple lesions noted in the liver.  MRI liver 09/19/2018-multiple  benign hepatic hemangiomas.  High-resolution CT 09/08/2019-perilymphatic micronodularity.  Act trapping.  Aortic, coronary atherosclerosis. I have reviewed all images personally.  PFTs: 07/11/2018 FVC 1.68 (51%), FEV1 1.31 [50%), F/F 78, TLC 72%, RV/TLC 173%, DLCO 52%, DLCO/VA 111% Mild restriction with moderate diffusion impairment which corrects for alveolar volume Air-trapping.  FENO 07/24/1989-24  Labs: CBC 07/24/2018-WBC 8.5, eos 3.5%, absolute  eosinophil count 297 IgE 07/24/2018- 941.  Assessment:  Abnormal PFTs Reviewed PFTs which show mostly mild restriction with moderate diffusion impairment.  There is a read of severe obstruction but on my review this is minimal.  He does have some mild trapping and improvement in flow rates post albuterol which may indicate minimal reactive airway disease.  Currently not on any inhalers.  Suspect that the restriction and diffusion impairment may be related to his heart failure CT reviewed with minimal changes, no clear evidence of interstitial lung disease or amiodarone toxicity.  Follow clinically.  Hepatic lesions Noted on CT scan last year.  Follow-up MRI is consistent with benign hemangiomas.  This appointment required 35 minutes of patient care (this includes precharting, chart review, review of results, face-to-face care, etc.).  Plan/Recommendations: -Follow-up in 1 year.  Marshell Garfinkel MD [facility] 10/10/2019, 2:19 PM  CC: [name], MD

## 2019-10-14 ENCOUNTER — Telehealth: Payer: Self-pay

## 2019-10-14 NOTE — Telephone Encounter (Signed)
Left message for patient to remind of missed remote transmission.  

## 2019-10-20 ENCOUNTER — Ambulatory Visit (INDEPENDENT_AMBULATORY_CARE_PROVIDER_SITE_OTHER): Payer: Medicare (Managed Care) | Admitting: Internal Medicine

## 2019-10-20 ENCOUNTER — Other Ambulatory Visit: Payer: Self-pay

## 2019-10-20 ENCOUNTER — Encounter: Payer: Self-pay | Admitting: Internal Medicine

## 2019-10-20 VITALS — BP 173/72 | HR 69 | Ht 66.0 in | Wt 252.2 lb

## 2019-10-20 DIAGNOSIS — I480 Paroxysmal atrial fibrillation: Secondary | ICD-10-CM

## 2019-10-20 DIAGNOSIS — Z95810 Presence of automatic (implantable) cardiac defibrillator: Secondary | ICD-10-CM | POA: Diagnosis not present

## 2019-10-20 DIAGNOSIS — I5022 Chronic systolic (congestive) heart failure: Secondary | ICD-10-CM | POA: Diagnosis not present

## 2019-10-20 DIAGNOSIS — N183 Chronic kidney disease, stage 3 unspecified: Secondary | ICD-10-CM

## 2019-10-20 MED ORDER — FUROSEMIDE 80 MG PO TABS: 80.0000 mg | ORAL_TABLET | Freq: Every day | ORAL | 3 refills | Status: DC

## 2019-10-20 MED ORDER — AMIODARONE HCL 100 MG PO TABS: 100.0000 mg | ORAL_TABLET | Freq: Every day | ORAL | 1 refills | Status: DC

## 2019-10-20 NOTE — Progress Notes (Signed)
OFFICE NOTE  Chief Complaint:  Diarrhea  Primary Care Physician: [name], MD  HPI:  Dean Mehringer Sr. is a 69 y.o. male with a past medial history significant for acute on chronic systolic congestive heart failure.  His LVEF is about 30 to 35% he has a known left bundle branch block and is status post AICD.  I first met him as a new cardiology patient in December 2017. He had a VT or VF arrest requiring CPR, defibrillation and medications.  He also has obstructive sleep apnea, hypertension, dyslipidemia and type 2 diabetes.  Since then he has had a number of hospitalization is been managed by my partners and I have not seen him back in the office.  More recently, he was hospitalized in September apparently after his PCP a cut back some of his diuretics possibly due to worsening renal function.  His device impedance trends have shown an increase suggesting volume overload.  He was also short of breath.  He required IV diuresis and was placed on Aldactone in addition to his Lasix.  He had been transitioned over to valsartan 320 mg daily however he reports that he is felt somewhat fatigued since being on that medicine over the past year.  Of note his diastolic blood pressures are somewhat low around 50.  Since discharge though he has felt better with regards to heart failure.  His discharge weight was 250 pounds and now he is at 245 pounds.  He denies any worsening fluid overload or edema.  He also has paroxysmal atrial fibrillation and has had some short runs of A. fib that have been picked up on his Linq and device, however he is on amiodarone and was in sinus rhythm while hospitalized.  10/20/2019  Dean [name] is seen today in the office in follow-up.  I last saw him via virtual visit about 3 months ago.  I decreased his valsartan due to some issues with fatigue and low diastolic blood pressure.  His diuretic had been decreased from 80 twice a day to 80 once a day and then recently he has  been having some diarrhea.  He was advised to stop the Lasix and has been off of it for about a week.  Weight does appear to be up somewhat since I last saw him but had come down prior to that.  He denies any worsening heart failure symptoms.  He had a recent remote check which showed no significant events on his ICD.  He also has a Linq.  PMHx:  Past Medical History:  Diagnosis Date  . 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 Arthritis    "maybe in his spine" (09/05/2016)  . Asthma    pt. denies  . Cardiac arrest ([place]) 08/27/2016   REQUIRING CPR, SHOCK & MEDICATIONS  . CHF (congestive heart failure) ([place])   . Coronary artery disease    90% ostRCA stenosis and total occlusion of dRCA with L-->R collaterals 2017  . Dysrhythmia   . Gout   . High cholesterol   . Hypertension   . Ischemic cardiomyopathy   . LBBB (left bundle branch block)   . OSA on CPAP   . Type II diabetes mellitus ([place])     Past Surgical History:  Procedure Laterality Date  . CARDIAC CATHETERIZATION N/A 09/07/2016   Procedure: Left Heart Cath and Coronary Angiography;  Surgeon: Peter M Martinique, MD;  Location: [facility];  Service: Cardiovascular;  Laterality: N/A;  . COLONOSCOPY WITH  PROPOFOL N/A 08/30/2018   Procedure: COLONOSCOPY WITH PROPOFOL;  Surgeon: Carol Ada, MD;  Location: [facility];  Service: Endoscopy;  Laterality: N/A;  . EP IMPLANTABLE DEVICE N/A 10/10/2016   Procedure: ICD Implant;  Surgeon: Will Meredith Leeds, MD;  Location: [facility];  Service: Cardiovascular;  Laterality: N/A;  . LUMBAR LAMINECTOMY/DECOMPRESSION MICRODISCECTOMY N/A 01/18/2018   Procedure: L2-3, L3-4, L-4-5 CENTRAL DECOMPRESSION;  Surgeon: [name], MD;  Location: [facility];  Service: Orthopedics;  Laterality: N/A;  . POLYPECTOMY  08/30/2018   Procedure: POLYPECTOMY;  Surgeon: Carol Ada, MD;  Location: [facility];  Service: Endoscopy;;    FAMHx:  Family History  Problem  Relation Age of Onset  . Heart attack Father        died in his 88's  . Hypertension Sister   . Cancer Brother        uncertain, "leg"  . Hypertension Sister     SOCHx:   reports that he has never smoked. He has never used smokeless tobacco. He reports that he does not drink alcohol or use drugs.  ALLERGIES:  Allergies  Allergen Reactions  . Contrast Media [Iodinated Diagnostic Agents] Shortness Of Breath and Nausea And Vomiting  . Lisinopril Cough    ROS: Pertinent items noted in HPI and remainder of comprehensive ROS otherwise negative.  HOME MEDS: Current Outpatient Medications on File Prior to Visit  Medication Sig Dispense Refill  . allopurinol (ZYLOPRIM) 300 MG tablet Take 300 mg by mouth daily.    [name] amiodarone (PACERONE) 200 MG tablet Take 1 tablet (200 mg total) by mouth daily. 90 tablet 2  . atorvastatin (LIPITOR) 40 MG tablet Take 1 tablet (40 mg total) by mouth daily at 6 PM. 30 tablet 5  . carvedilol (COREG) 25 MG tablet Take 1 tablet (25 mg total) by mouth 2 (two) times daily with a meal. 60 tablet 7  . CVS VITAMIN D3 1000 units capsule Take 1,000 Units by mouth daily.  5  . ELIQUIS 5 MG TABS tablet TAKE 1 TABLET BY MOUTH TWICE A DAY 60 tablet 5  . furosemide (LASIX) 80 MG tablet Take 1 tablet (80 mg total) by mouth 2 (two) times daily. 60 tablet 0  . magnesium oxide (MAG-OX) 400 MG tablet Take 200 mg by mouth 2 (two) times daily.    . pantoprazole (PROTONIX) 40 MG tablet Take 1 tablet (40 mg total) by mouth daily. 30 tablet 5  . potassium chloride SA (K-DUR) 20 MEQ tablet Take 2 tablets (40 mEq total) by mouth daily. 30 tablet 0  . spironolactone (ALDACTONE) 25 MG tablet Take 0.5 tablets (12.5 mg total) by mouth daily. 30 tablet 0  . valsartan (DIOVAN) 160 MG tablet Take 1 tablet (160 mg total) by mouth daily. 90 tablet 3   No current facility-administered medications on file prior to visit.    LABS/IMAGING: No results found for this or any previous visit (from  the past 48 hour(s)). No results found.  LIPID PANEL:    Component Value Date/Time   CHOL 159 09/07/2016 0844   TRIG 146 09/07/2016 0844   HDL 25 (L) 09/07/2016 0844   CHOLHDL 6.4 09/07/2016 0844   VLDL 29 09/07/2016 0844   LDLCALC 105 (H) 09/07/2016 0844     WEIGHTS: Wt Readings from Last 3 Encounters:  10/20/19 252 lb 3.2 oz (114.4 kg)  10/10/19 256 lb (116.1 kg)  07/15/19 245 lb (111.1 kg)    VITALS: BP (!) 173/72   Pulse  69   Ht '5\' 6"'  (1.676 m)   Wt 252 lb 3.2 oz (114.4 kg)   SpO2 98%   BMI 40.71 kg/m   EXAM: General appearance: alert, no distress and morbidly obese Neck: no carotid bruit, no JVD and thyroid not enlarged, symmetric, no tenderness/mass/nodules Lungs: clear to auscultation bilaterally Heart: regular rate and rhythm Abdomen: soft, non-tender; bowel sounds normal; no masses,  no organomegaly Extremities: extremities normal, atraumatic, no cyanosis or edema Pulses: 2+ and symmetric Skin: Skin color, texture, turgor normal. No rashes or lesions Neurologic: Grossly normal Psych: Pleasant  EKG: Sinus rhythm first-degree AV block, LBBB at 69-personally reviewed  ASSESSMENT: 1. Acute on chronic systolic congestive heart failure, LVEF 30 to 35%, NYHA class II symptoms 2. History of VT/VF arrest status post AICD 3. 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 OSA on CPAP 6. Hypertension 7. Dyslipidemia 8. Type 2 diabetes 9. Chronic anemia 10. LBBB  PLAN: 1.   Dean [name] has recently had some issues with diarrhea was advised to hold his Lasix.  Is now been off of it for a week and I am concerned about weight gain off of diuretics.  I advised him to restart 80 mg daily.  He should monitor the diarrhea.  This could possibly be related to the amiodarone and therefore advised him to decrease to 100 mg daily.  We will plan a close follow-up in about 3 months which time I may be able to stop it.  The monitor function of his defibrillator and Linq  recorder have not shown any recurrent atrial fibrillation.  Pixie Casino, MD, [facility], [place] Director of the Advanced Lipid Disorders &  Cardiovascular Risk Reduction Clinic Diplomate of the American Board of Clinical Lipidology Attending Cardiologist  Direct Dial: [phone]  Fax: [phone]  Website:  [email or web address] 10/20/2019, 1:41 PM

## 2019-10-20 NOTE — Patient Instructions (Signed)
Medication Instructions:  DECREASE amiodarone to 100mg  daily RESUME lasix 80mg  daily  *If you need a refill on your cardiac medications before your next appointment, please call your pharmacy*  Follow-Up: At [facility], you and your health needs are our priority.  As part of our continuing mission to provide you with exceptional heart care, we have created designated Provider Care Teams.  These Care Teams include your primary Cardiologist (physician) and Advanced Practice Providers (APPs -  Physician Assistants and Nurse Practitioners) who all work together to provide you with the care you need, when you need it.  Your next appointment:   3 month(s)  The format for your next appointment:   In Person  Provider:   You may see Pixie Casino, MD or one of the following Advanced Practice Providers on your designated Care Team:    Almyra Deforest, PA-C  Fabian Sharp, PA-C or   Roby Lofts, [place]   Other Instructions

## 2019-10-21 NOTE — Progress Notes (Signed)
No ICM remote transmission received for 10/14/2019 and next ICM transmission scheduled for 10/27/2019.

## 2019-10-23 ENCOUNTER — Emergency Department (HOSPITAL_COMMUNITY): Payer: Medicare (Managed Care)

## 2019-10-23 ENCOUNTER — Encounter (HOSPITAL_COMMUNITY): Payer: Self-pay | Admitting: Emergency Medicine

## 2019-10-23 ENCOUNTER — Inpatient Hospital Stay (HOSPITAL_COMMUNITY)
Admission: EM | Admit: 2019-10-23 | Discharge: 2019-10-26 | DRG: 641 | Disposition: A | Discharge status: Home / Self Care | Payer: Medicare (Managed Care) | Attending: Internal Medicine | Admitting: Internal Medicine

## 2019-10-23 ENCOUNTER — Other Ambulatory Visit: Payer: Self-pay

## 2019-10-23 DIAGNOSIS — Z95810 Presence of automatic (implantable) cardiac defibrillator: Secondary | ICD-10-CM

## 2019-10-23 DIAGNOSIS — E1122 Type 2 diabetes mellitus with diabetic chronic kidney disease: Secondary | ICD-10-CM | POA: Diagnosis present

## 2019-10-23 DIAGNOSIS — D72829 Elevated white blood cell count, unspecified: Secondary | ICD-10-CM | POA: Diagnosis present

## 2019-10-23 DIAGNOSIS — F419 Anxiety disorder, unspecified: Secondary | ICD-10-CM | POA: Diagnosis present

## 2019-10-23 DIAGNOSIS — I255 Ischemic cardiomyopathy: Secondary | ICD-10-CM | POA: Diagnosis present

## 2019-10-23 DIAGNOSIS — I5022 Chronic systolic (congestive) heart failure: Secondary | ICD-10-CM | POA: Diagnosis present

## 2019-10-23 DIAGNOSIS — N179 Acute kidney failure, unspecified: Secondary | ICD-10-CM | POA: Diagnosis present

## 2019-10-23 DIAGNOSIS — E860 Dehydration: Secondary | ICD-10-CM | POA: Diagnosis present

## 2019-10-23 DIAGNOSIS — E875 Hyperkalemia: Secondary | ICD-10-CM

## 2019-10-23 DIAGNOSIS — E871 Hypo-osmolality and hyponatremia: Secondary | ICD-10-CM | POA: Diagnosis not present

## 2019-10-23 DIAGNOSIS — Z23 Encounter for immunization: Secondary | ICD-10-CM | POA: Diagnosis present

## 2019-10-23 DIAGNOSIS — I447 Left bundle-branch block, unspecified: Secondary | ICD-10-CM | POA: Diagnosis present

## 2019-10-23 DIAGNOSIS — I2510 Atherosclerotic heart disease of native coronary artery without angina pectoris: Secondary | ICD-10-CM | POA: Diagnosis present

## 2019-10-23 DIAGNOSIS — Z8674 Personal history of sudden cardiac arrest: Secondary | ICD-10-CM | POA: Diagnosis not present

## 2019-10-23 DIAGNOSIS — I480 Paroxysmal atrial fibrillation: Secondary | ICD-10-CM | POA: Diagnosis present

## 2019-10-23 DIAGNOSIS — R195 Other fecal abnormalities: Secondary | ICD-10-CM | POA: Diagnosis present

## 2019-10-23 DIAGNOSIS — E785 Hyperlipidemia, unspecified: Secondary | ICD-10-CM | POA: Diagnosis present

## 2019-10-23 DIAGNOSIS — Z91041 Radiographic dye allergy status: Secondary | ICD-10-CM

## 2019-10-23 DIAGNOSIS — G4733 Obstructive sleep apnea (adult) (pediatric): Secondary | ICD-10-CM | POA: Diagnosis present

## 2019-10-23 DIAGNOSIS — N1832 Chronic kidney disease, stage 3b: Secondary | ICD-10-CM | POA: Diagnosis present

## 2019-10-23 DIAGNOSIS — N183 Chronic kidney disease, stage 3 unspecified: Secondary | ICD-10-CM | POA: Diagnosis present

## 2019-10-23 DIAGNOSIS — E119 Type 2 diabetes mellitus without complications: Secondary | ICD-10-CM

## 2019-10-23 DIAGNOSIS — R197 Diarrhea, unspecified: Secondary | ICD-10-CM | POA: Diagnosis present

## 2019-10-23 DIAGNOSIS — I130 Hypertensive heart and chronic kidney disease with heart failure and stage 1 through stage 4 chronic kidney disease, or unspecified chronic kidney disease: Secondary | ICD-10-CM | POA: Diagnosis present

## 2019-10-23 DIAGNOSIS — Y92009 Unspecified place in unspecified non-institutional (private) residence as the place of occurrence of the external cause: Secondary | ICD-10-CM | POA: Diagnosis not present

## 2019-10-23 DIAGNOSIS — D631 Anemia in chronic kidney disease: Secondary | ICD-10-CM | POA: Diagnosis present

## 2019-10-23 DIAGNOSIS — N3289 Other specified disorders of bladder: Secondary | ICD-10-CM | POA: Diagnosis present

## 2019-10-23 DIAGNOSIS — T500X5A Adverse effect of mineralocorticoids and their antagonists, initial encounter: Secondary | ICD-10-CM | POA: Diagnosis present

## 2019-10-23 DIAGNOSIS — Z79899 Other long term (current) drug therapy: Secondary | ICD-10-CM

## 2019-10-23 DIAGNOSIS — Z20822 Contact with and (suspected) exposure to covid-19: Secondary | ICD-10-CM | POA: Diagnosis present

## 2019-10-23 DIAGNOSIS — E872 Acidosis: Secondary | ICD-10-CM | POA: Diagnosis present

## 2019-10-23 DIAGNOSIS — E7800 Pure hypercholesterolemia, unspecified: Secondary | ICD-10-CM | POA: Diagnosis present

## 2019-10-23 DIAGNOSIS — D696 Thrombocytopenia, unspecified: Secondary | ICD-10-CM | POA: Diagnosis present

## 2019-10-23 DIAGNOSIS — T465X5A Adverse effect of other antihypertensive drugs, initial encounter: Secondary | ICD-10-CM | POA: Diagnosis present

## 2019-10-23 DIAGNOSIS — D649 Anemia, unspecified: Secondary | ICD-10-CM | POA: Diagnosis not present

## 2019-10-23 DIAGNOSIS — Z888 Allergy status to other drugs, medicaments and biological substances status: Secondary | ICD-10-CM

## 2019-10-23 DIAGNOSIS — M109 Gout, unspecified: Secondary | ICD-10-CM | POA: Diagnosis present

## 2019-10-23 DIAGNOSIS — E1169 Type 2 diabetes mellitus with other specified complication: Secondary | ICD-10-CM

## 2019-10-23 DIAGNOSIS — Z7901 Long term (current) use of anticoagulants: Secondary | ICD-10-CM

## 2019-10-23 DIAGNOSIS — R251 Tremor, unspecified: Secondary | ICD-10-CM | POA: Diagnosis present

## 2019-10-23 DIAGNOSIS — Z6841 Body Mass Index (BMI) 40.0 and over, adult: Secondary | ICD-10-CM | POA: Diagnosis not present

## 2019-10-23 DIAGNOSIS — I10 Essential (primary) hypertension: Secondary | ICD-10-CM | POA: Diagnosis not present

## 2019-10-23 DIAGNOSIS — Z8249 Family history of ischemic heart disease and other diseases of the circulatory system: Secondary | ICD-10-CM

## 2019-10-23 DIAGNOSIS — Z713 Dietary counseling and surveillance: Secondary | ICD-10-CM

## 2019-10-23 DIAGNOSIS — T503X5A Adverse effect of electrolytic, caloric and water-balance agents, initial encounter: Secondary | ICD-10-CM | POA: Diagnosis present

## 2019-10-23 LAB — COMPREHENSIVE METABOLIC PANEL
ALT: 28 U/L (ref 0–44)
AST: 25 U/L (ref 15–41)
Albumin: 4.1 g/dL (ref 3.5–5.0)
Alkaline Phosphatase: 98 U/L (ref 38–126)
Anion gap: 9 (ref 5–15)
BUN: 70 mg/dL — ABNORMAL HIGH (ref 8–23)
CO2: 16 mmol/L — ABNORMAL LOW (ref 22–32)
Calcium: 10.4 mg/dL — ABNORMAL HIGH (ref 8.9–10.3)
Chloride: 109 mmol/L (ref 98–111)
Creatinine, Ser: 3.82 mg/dL — ABNORMAL HIGH (ref 0.61–1.24)
GFR calc Af Amer: 18 mL/min — ABNORMAL LOW (ref >60)
GFR calc non Af Amer: 15 mL/min — ABNORMAL LOW (ref >60)
Glucose, Bld: 121 mg/dL — ABNORMAL HIGH (ref 70–99)
Potassium: 7.3 mmol/L (ref 3.5–5.1)
Sodium: 134 mmol/L — ABNORMAL LOW (ref 135–145)
Total Bilirubin: 0.2 mg/dL — ABNORMAL LOW (ref 0.3–1.2)
Total Protein: 7.6 g/dL (ref 6.5–8.1)

## 2019-10-23 LAB — POC OCCULT BLOOD, ED: Fecal Occult Bld: NEGATIVE

## 2019-10-23 LAB — BASIC METABOLIC PANEL
Anion gap: 9 (ref 5–15)
Anion gap: 7 (ref 5–15)
BUN: 62 mg/dL — ABNORMAL HIGH (ref 8–23)
BUN: 60 mg/dL — ABNORMAL HIGH (ref 8–23)
CO2: 20 mmol/L — ABNORMAL LOW (ref 22–32)
CO2: 23 mmol/L (ref 22–32)
Calcium: 10.6 mg/dL — ABNORMAL HIGH (ref 8.9–10.3)
Calcium: 10.3 mg/dL (ref 8.9–10.3)
Chloride: 110 mmol/L (ref 98–111)
Chloride: 108 mmol/L (ref 98–111)
Creatinine, Ser: 3.42 mg/dL — ABNORMAL HIGH (ref 0.61–1.24)
Creatinine, Ser: 3.39 mg/dL — ABNORMAL HIGH (ref 0.61–1.24)
GFR calc Af Amer: 20 mL/min — ABNORMAL LOW (ref >60)
GFR calc Af Amer: 20 mL/min — ABNORMAL LOW (ref >60)
GFR calc non Af Amer: 17 mL/min — ABNORMAL LOW (ref >60)
GFR calc non Af Amer: 17 mL/min — ABNORMAL LOW (ref >60)
Glucose, Bld: 90 mg/dL (ref 70–99)
Glucose, Bld: 106 mg/dL — ABNORMAL HIGH (ref 70–99)
Potassium: 6.5 mmol/L (ref 3.5–5.1)
Potassium: 6.2 mmol/L — ABNORMAL HIGH (ref 3.5–5.1)
Sodium: 139 mmol/L (ref 135–145)
Sodium: 138 mmol/L (ref 135–145)

## 2019-10-23 LAB — POCT I-STAT EG7
Acid-base deficit: 4 mmol/L — ABNORMAL HIGH (ref 0.0–2.0)
Bicarbonate: 23.3 mmol/L (ref 20.0–28.0)
Calcium, Ion: 1.45 mmol/L — ABNORMAL HIGH (ref 1.15–1.40)
HCT: 31 % — ABNORMAL LOW (ref 39.0–52.0)
Hemoglobin: 10.5 g/dL — ABNORMAL LOW (ref 13.0–17.0)
O2 Saturation: 58 %
Potassium: 6.7 mmol/L (ref 3.5–5.1)
Sodium: 139 mmol/L (ref 135–145)
TCO2: 25 mmol/L (ref 22–32)
pCO2, Ven: 49 mmHg (ref 44.0–60.0)
pH, Ven: 7.284 (ref 7.250–7.430)
pO2, Ven: 34 mmHg (ref 32.0–45.0)

## 2019-10-23 LAB — C DIFFICILE QUICK SCREEN W PCR REFLEX
C Diff antigen: NEGATIVE
C Diff interpretation: NOT DETECTED
C Diff toxin: NEGATIVE

## 2019-10-23 LAB — URINALYSIS, ROUTINE W REFLEX MICROSCOPIC
Bacteria, UA: NONE SEEN
Bilirubin Urine: NEGATIVE
Glucose, UA: NEGATIVE mg/dL
Hgb urine dipstick: NEGATIVE
Ketones, ur: NEGATIVE mg/dL
Nitrite: NEGATIVE
Protein, ur: NEGATIVE mg/dL
Specific Gravity, Urine: 1.01 (ref 1.005–1.030)
pH: 5 (ref 5.0–8.0)

## 2019-10-23 LAB — CBG MONITORING, ED
Glucose-Capillary: 84 mg/dL (ref 70–99)
Glucose-Capillary: 97 mg/dL (ref 70–99)
Glucose-Capillary: 95 mg/dL (ref 70–99)

## 2019-10-23 LAB — CBC
HCT: 38 % — ABNORMAL LOW (ref 39.0–52.0)
Hemoglobin: 11.7 g/dL — ABNORMAL LOW (ref 13.0–17.0)
MCH: 29.3 pg (ref 26.0–34.0)
MCHC: 30.8 g/dL (ref 30.0–36.0)
MCV: 95.2 fL (ref 80.0–100.0)
Platelets: 190 10*3/uL (ref 150–400)
RBC: 3.99 MIL/uL — ABNORMAL LOW (ref 4.22–5.81)
RDW: 13.2 % (ref 11.5–15.5)
WBC: 14 10*3/uL — ABNORMAL HIGH (ref 4.0–10.5)
nRBC: 0 % (ref 0.0–0.2)

## 2019-10-23 LAB — POTASSIUM
Potassium: 5.7 mmol/L — ABNORMAL HIGH (ref 3.5–5.1)
Potassium: 5.9 mmol/L — ABNORMAL HIGH (ref 3.5–5.1)

## 2019-10-23 LAB — HEMOGLOBIN A1C
Hgb A1c MFr Bld: 5.6 % (ref 4.8–5.6)
Mean Plasma Glucose: 114.02 mg/dL

## 2019-10-23 LAB — RESPIRATORY PANEL BY RT PCR (FLU A&B, COVID)
Influenza A by PCR: NEGATIVE
Influenza B by PCR: NEGATIVE
SARS Coronavirus 2 by RT PCR: NEGATIVE

## 2019-10-23 LAB — LIPASE, BLOOD: Lipase: 46 U/L (ref 11–51)

## 2019-10-23 LAB — GLUCOSE, CAPILLARY: Glucose-Capillary: 97 mg/dL (ref 70–99)

## 2019-10-23 MED ORDER — DEXTROSE 50 % IV SOLN
50.0000 mL | Freq: Once | INTRAVENOUS | Status: AC
  Administered 2019-10-23: 50 mL via INTRAVENOUS
  Filled 2019-10-23: qty 50

## 2019-10-23 MED ORDER — SODIUM ZIRCONIUM CYCLOSILICATE 10 G PO PACK
10.0000 g | PACK | Freq: Once | ORAL | Status: AC
  Administered 2019-10-23: 10 g via ORAL
  Filled 2019-10-23: qty 1

## 2019-10-23 MED ORDER — PNEUMOCOCCAL VAC POLYVALENT 25 MCG/0.5ML IJ INJ
0.5000 mL | INJECTION | INTRAMUSCULAR | Status: AC
  Administered 2019-10-24: 0.5 mL via INTRAMUSCULAR
  Filled 2019-10-23: qty 0.5

## 2019-10-23 MED ORDER — SODIUM CHLORIDE 0.9% FLUSH
3.0000 mL | Freq: Once | INTRAVENOUS | Status: AC
  Administered 2019-10-23: 3 mL via INTRAVENOUS

## 2019-10-23 MED ORDER — CHLORHEXIDINE GLUCONATE CLOTH 2 % EX PADS
6.0000 | MEDICATED_PAD | Freq: Every day | CUTANEOUS | Status: DC
  Administered 2019-10-23: 6 via TOPICAL

## 2019-10-23 MED ORDER — INSULIN ASPART 100 UNIT/ML ~~LOC~~ SOLN
4.0000 [IU] | Freq: Once | SUBCUTANEOUS | Status: AC
  Administered 2019-10-23: 4 [IU] via INTRAVENOUS

## 2019-10-23 MED ORDER — FUROSEMIDE 10 MG/ML IJ SOLN
80.0000 mg | Freq: Once | INTRAMUSCULAR | Status: DC
  Filled 2019-10-23: qty 8

## 2019-10-23 MED ORDER — SODIUM BICARBONATE 650 MG PO TABS
650.0000 mg | ORAL_TABLET | Freq: Three times a day (TID) | ORAL | Status: DC
  Administered 2019-10-23 – 2019-10-26 (×10): 650 mg via ORAL
  Filled 2019-10-23 (×12): qty 1

## 2019-10-23 MED ORDER — SODIUM BICARBONATE 8.4 % IV SOLN
100.0000 meq | Freq: Once | INTRAVENOUS | Status: AC
  Administered 2019-10-23: 100 meq via INTRAVENOUS
  Filled 2019-10-23: qty 100

## 2019-10-23 MED ORDER — INSULIN ASPART 100 UNIT/ML ~~LOC~~ SOLN: 0.0000 [IU] | Freq: Three times a day (TID) | SUBCUTANEOUS | Status: DC

## 2019-10-23 MED ORDER — CALCIUM GLUCONATE 10 % IV SOLN
1.0000 g | Freq: Once | INTRAVENOUS | Status: AC
  Administered 2019-10-23: 1 g via INTRAVENOUS
  Filled 2019-10-23: qty 10

## 2019-10-23 MED ORDER — HYDRALAZINE HCL 20 MG/ML IJ SOLN: 5.0000 mg | INTRAMUSCULAR | Status: DC | PRN

## 2019-10-23 MED ORDER — FUROSEMIDE 10 MG/ML IJ SOLN
60.0000 mg | Freq: Once | INTRAMUSCULAR | Status: AC
  Administered 2019-10-23: 60 mg via INTRAVENOUS
  Filled 2019-10-23: qty 6

## 2019-10-23 MED ORDER — ALBUTEROL SULFATE (2.5 MG/3ML) 0.083% IN NEBU
5.0000 mg | INHALATION_SOLUTION | Freq: Once | RESPIRATORY_TRACT | Status: AC
  Administered 2019-10-23: 5 mg via RESPIRATORY_TRACT
  Filled 2019-10-23: qty 6

## 2019-10-23 MED ORDER — SODIUM BICARBONATE 8.4 % IV SOLN
50.0000 meq | Freq: Once | INTRAVENOUS | Status: AC
  Administered 2019-10-23: 50 meq via INTRAVENOUS
  Filled 2019-10-23: qty 50

## 2019-10-23 MED ORDER — SODIUM ZIRCONIUM CYCLOSILICATE 10 G PO PACK
10.0000 g | PACK | Freq: Every day | ORAL | Status: AC
  Administered 2019-10-23: 10 g via ORAL
  Filled 2019-10-23: qty 1

## 2019-10-23 MED ORDER — SODIUM CHLORIDE 0.9 % IV BOLUS
250.0000 mL | Freq: Once | INTRAVENOUS | Status: AC
  Administered 2019-10-23: 250 mL via INTRAVENOUS

## 2019-10-23 MED ORDER — ACETAMINOPHEN 325 MG PO TABS: 650.0000 mg | ORAL_TABLET | Freq: Four times a day (QID) | ORAL | Status: DC | PRN

## 2019-10-23 MED ORDER — SODIUM CHLORIDE 0.9 % IV BOLUS (SEPSIS)
250.0000 mL | Freq: Once | INTRAVENOUS | Status: AC
  Administered 2019-10-23: 250 mL via INTRAVENOUS

## 2019-10-23 MED ORDER — SODIUM POLYSTYRENE SULFONATE 15 GM/60ML PO SUSP
30.0000 g | Freq: Once | ORAL | Status: AC
  Administered 2019-10-23: 30 g via ORAL
  Filled 2019-10-23: qty 120

## 2019-10-23 MED ORDER — SODIUM POLYSTYRENE SULFONATE 15 GM/60ML PO SUSP
30.0000 g | Freq: Once | ORAL | Status: AC
  Administered 2019-10-23: 08:00:00 30 g via ORAL
  Filled 2019-10-23: qty 120

## 2019-10-23 MED ORDER — INSULIN ASPART 100 UNIT/ML ~~LOC~~ SOLN: 0.0000 [IU] | Freq: Every day | SUBCUTANEOUS | Status: DC

## 2019-10-23 MED ORDER — SODIUM ZIRCONIUM CYCLOSILICATE 10 G PO PACK
10.0000 g | PACK | Freq: Three times a day (TID) | ORAL | Status: AC
  Administered 2019-10-23 – 2019-10-24 (×4): 10 g via ORAL
  Filled 2019-10-23 (×4): qty 1

## 2019-10-23 MED ORDER — ACETAMINOPHEN 650 MG RE SUPP: 650.0000 mg | Freq: Four times a day (QID) | RECTAL | Status: DC | PRN

## 2019-10-23 NOTE — Plan of Care (Signed)
  Problem: Education: Goal: Knowledge of General Education information will improve Description: Including pain rating scale, medication(s)/side effects and non-pharmacologic comfort measures 10/23/2019 2144 by Georges Lynch, RN Outcome: Progressing 10/23/2019 2144 by Georges Lynch, RN Outcome: Progressing   Problem: Health Behavior/Discharge Planning: Goal: Ability to manage health-related needs will improve 10/23/2019 2144 by Georges Lynch, RN Outcome: Progressing 10/23/2019 2144 by Georges Lynch, RN Outcome: Progressing   Problem: Clinical Measurements: Goal: Ability to maintain clinical measurements within normal limits will improve 10/23/2019 2144 by Georges Lynch, RN Outcome: Progressing 10/23/2019 2144 by Georges Lynch, RN Outcome: Progressing Goal: Will remain free from infection 10/23/2019 2144 by Georges Lynch, RN Outcome: Progressing 10/23/2019 2144 by Georges Lynch, RN Outcome: Progressing Goal: Diagnostic test results will improve 10/23/2019 2144 by Georges Lynch, RN Outcome: Progressing 10/23/2019 2144 by Georges Lynch, RN Outcome: Progressing Goal: Respiratory complications will improve 10/23/2019 2144 by Georges Lynch, RN Outcome: Progressing 10/23/2019 2144 by Georges Lynch, RN Outcome: Progressing Goal: Cardiovascular complication will be avoided 10/23/2019 2144 by Georges Lynch, RN Outcome: Progressing 10/23/2019 2144 by Georges Lynch, RN Outcome: Progressing   Problem: Activity: Goal: Risk for activity intolerance will decrease 10/23/2019 2144 by Georges Lynch, RN Outcome: Progressing 10/23/2019 2144 by Georges Lynch, RN Outcome: Progressing   Problem: Nutrition: Goal: Adequate nutrition will be maintained 10/23/2019 2144 by Georges Lynch, RN Outcome: Progressing 10/23/2019 2144 by Georges Lynch, RN Outcome: Progressing   Problem: Coping: Goal: Level  of anxiety will decrease 10/23/2019 2144 by Georges Lynch, RN Outcome: Progressing 10/23/2019 2144 by Georges Lynch, RN Outcome: Progressing   Problem: Elimination: Goal: Will not experience complications related to bowel motility 10/23/2019 2144 by Georges Lynch, RN Outcome: Progressing 10/23/2019 2144 by Georges Lynch, RN Outcome: Progressing Goal: Will not experience complications related to urinary retention 10/23/2019 2144 by Georges Lynch, RN Outcome: Progressing 10/23/2019 2144 by Georges Lynch, RN Outcome: Progressing   Problem: Pain Managment: Goal: General experience of comfort will improve 10/23/2019 2144 by Georges Lynch, RN Outcome: Progressing 10/23/2019 2144 by Georges Lynch, RN Outcome: Progressing   Problem: Safety: Goal: Ability to remain free from injury will improve 10/23/2019 2144 by Georges Lynch, RN Outcome: Progressing 10/23/2019 2144 by Georges Lynch, RN Outcome: Progressing   Problem: Skin Integrity: Goal: Risk for impaired skin integrity will decrease 10/23/2019 2144 by Georges Lynch, RN Outcome: Progressing 10/23/2019 2144 by Georges Lynch, RN Outcome: Progressing

## 2019-10-23 NOTE — ED Triage Notes (Signed)
Patient reports diarrhea for 2 weeks , bilateral legs weakness for 2 weeks and irregular blood pressure this week , denies emesis or fever .

## 2019-10-23 NOTE — Evaluation (Signed)
Physical Therapy Evaluation and Discharge Patient Details Name: Dean [name]. MRN: [id number] DOB: 1951/06/15 Today's Date: 10/23/2019   History of Present Illness  Pt is a 69 y/o male admitted secondary to weakness and diarrhea. Pt found to have hyperkalemia. PMH includes HTN, gout, OSA on CPAP, CAD, ischemic cardiomyopathy, a fib, s/p AICD.   Clinical Impression  Patient evaluated by Physical Therapy with no further acute PT needs identified. All education has been completed and the patient has no further questions. Pt overall steady with mobility tasks within his room within the ED. Required supervision for safety. Pt reports weakness has improved. Encouraged pt to continue mobilizing with nursing staff throughout stay. See below for any follow-up Physical Therapy or equipment needs. PT is signing off. Thank you for this referral. If needs change, please re-consult.      Follow Up Recommendations No PT follow up    Equipment Recommendations  None recommended by PT    Recommendations for Other Services       Precautions / Restrictions Precautions Precautions: None Restrictions Weight Bearing Restrictions: No      Mobility  Bed Mobility               General bed mobility comments: In chair upon entry   Transfers Overall transfer level: Needs assistance Equipment used: None Transfers: Sit to/from Stand Sit to Stand: Supervision         General transfer comment: Supervision for safety. No LOB noted.   Ambulation/Gait Ambulation/Gait assistance: Supervision Gait Distance (Feet): 30 Feet Assistive device: None Gait Pattern/deviations: Step-through pattern;Decreased stride length Gait velocity: WFL   General Gait Details: Overall steady gait within ED room. No LOB noted and pt reports weakness has improved.   Stairs Stairs: Yes       General stair comments: Had pt march in place to simulate stair navigation as no stairs available in ED. Overall steady  while holding on to PT's hand.   Wheelchair Mobility    Modified Rankin (Stroke Patients Only)       Balance Overall balance assessment: No apparent balance deficits (not formally assessed)                                           Pertinent Vitals/Pain Pain Assessment: No/denies pain    Home Living Family/patient expects to be discharged to:: Private residence Living Arrangements: Children Available Help at Discharge: Family;Available PRN/intermittently Type of Home: House Home Access: Level entry     Home Layout: Two level Home Equipment: None      Prior Function Level of Independence: Independent               Hand Dominance        Extremity/Trunk Assessment   Upper Extremity Assessment Upper Extremity Assessment: Defer to OT evaluation    Lower Extremity Assessment Lower Extremity Assessment: Overall WFL for tasks assessed    Cervical / Trunk Assessment Cervical / Trunk Assessment: Normal  Communication   Communication: No difficulties  Cognition Arousal/Alertness: Awake/alert Behavior During Therapy: WFL for tasks assessed/performed Overall Cognitive Status: Within Functional Limits for tasks assessed                                        General Comments      Exercises  Assessment/Plan    PT Assessment Patent does not need any further PT services  PT Problem List         PT Treatment Interventions      PT Goals (Current goals can be found in the Care Plan section)  Acute Rehab PT Goals Patient Stated Goal: to get better  PT Goal Formulation: With patient Time For Goal Achievement: 10/23/19 Potential to Achieve Goals: Good    Frequency     Barriers to discharge        Co-evaluation               AM-PAC PT "6 Clicks" Mobility  Outcome Measure Help needed turning from your back to your side while in a flat bed without using bedrails?: None Help needed moving from lying on your  back to sitting on the side of a flat bed without using bedrails?: None Help needed moving to and from a bed to a chair (including a wheelchair)?: None Help needed standing up from a chair using your arms (e.g., wheelchair or bedside chair)?: None Help needed to walk in hospital room?: None Help needed climbing 3-5 steps with a railing? : A Little 6 Click Score: 23    End of Session   Activity Tolerance: Patient tolerated treatment well Patient left: in chair;with call bell/phone within reach Nurse Communication: Mobility status PT Visit Diagnosis: Other abnormalities of gait and mobility (R26.89)    Time: [id number] PT Time Calculation (min) (ACUTE ONLY): 14 min   Charges:   PT Evaluation $PT Eval Low Complexity: 1 Low          Lou Miner, DPT  Acute Rehabilitation Services  Pager: [phone] Office: [phone]   Rudean Hitt 10/23/2019, 5:39 PM

## 2019-10-23 NOTE — ED Notes (Signed)
CRITICAL POTSSIUM 6.5.  Dr. Alfredia Ferguson notified.

## 2019-10-23 NOTE — ED Provider Notes (Signed)
[facility] EMERGENCY DEPARTMENT Provider Note   CSN: [id number] Arrival date & time: 10/23/19  0120     History Chief Complaint  Patient presents with  . Diarrhea    Dean Squiers Sr. is a 69 y.o. male.  The history is provided by the patient.  Diarrhea Quality:  Black and tarry Severity:  Moderate Onset quality:  Gradual Duration:  2 weeks Timing:  Intermittent Progression:  Worsening Relieved by:  None tried Worsened by:  Nothing Associated symptoms: no abdominal pain, no recent cough, no fever and no vomiting   Risk factors: no recent antibiotic use       Patient with extensive history including CAD, CHF, history of cardiac arrest with ICD in place presents with diarrhea.  He reports intermittent diarrhea for the past 2 weeks.  He reports at times it is dark and black.  No blood in stool.  No vomiting.  He also reports generalized weakness.  He reports that he has had labile blood pressures and just prior to arrival he noted his blood pressure was quite elevated and he felt lightheaded.  He reports mild shortness of breath.  No chest pain.  No cough No recent antibiotics Past Medical History:  Diagnosis Date  . 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 Arthritis    "maybe in his spine" (09/05/2016)  . Asthma    pt. denies  . Cardiac arrest ([place]) 08/27/2016   REQUIRING CPR, SHOCK & MEDICATIONS  . CHF (congestive heart failure) ([place])   . Coronary artery disease    90% ostRCA stenosis and total occlusion of dRCA with L-->R collaterals 2017  . Dysrhythmia   . Gout   . High cholesterol   . Hypertension   . Ischemic cardiomyopathy   . LBBB (left bundle branch block)   . OSA on CPAP   . Type II diabetes mellitus [facility])     Patient Active Problem List   Diagnosis Date Noted  . CHF (congestive heart failure) ([place]) 06/04/2019  . Acute systolic CHF (congestive heart failure) ([place]) 06/04/2019  . Sepsis ([place]) 05/06/2019  .  Acute asthma exacerbation 05/06/2019  . Acute respiratory distress 05/06/2019  . Acute respiratory failure with hypoxia ([place]) 04/06/2019  . HCAP (healthcare-associated pneumonia) 04/06/2019  . Acute kidney failure ([place]) 02/27/2019  . Chronic combined systolic and diastolic CHF (congestive heart failure) ([place]) 02/27/2019  . CKD (chronic kidney disease), stage III 05/16/2018  . Normochromic normocytic anemia 05/15/2018  . CAD (coronary artery disease) 12/17/2017  . Hyperlipidemia LDL goal <70 12/17/2017  . 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 Cardiomyopathy, ischemic   . Debility 10/04/2016  . Stage 3 chronic kidney disease ([place])   . AKI (acute kidney injury) ([place])   . Disorientated   . Anoxic brain injury ([place]) 09/15/2016  . Benign essential HTN   . OSA (obstructive sleep apnea)   . Transaminitis   . Chronic respiratory failure with hypoxia ([place])   . Fever   . NSVT (nonsustained ventricular tachycardia) ([place])   . Uncomplicated asthma   . Diabetes mellitus type 2 in obese ([place])   . Acute on chronic combined systolic and diastolic CHF (congestive heart failure) ([place])   . Tachypnea   . Hypokalemia   . Leukocytosis   . Hypoxemia   . Acute respiratory failure ([place])   . Cardiopulmonary arrest ([place]) 08/27/2016  . DM2 (diabetes mellitus, type 2) ([place]) 03/02/2014  . Morbid obesity ([place]) 03/02/2014  . Essential hypertension, benign 03/02/2014  Past Surgical History:  Procedure Laterality Date  . CARDIAC CATHETERIZATION N/A 09/07/2016   Procedure: Left Heart Cath and Coronary Angiography;  Surgeon: Peter M Martinique, MD;  Location: [facility] CV LAB;  Service: Cardiovascular;  Laterality: N/A;  . COLONOSCOPY WITH PROPOFOL N/A 08/30/2018   Procedure: COLONOSCOPY WITH PROPOFOL;  Surgeon: Carol Ada, MD;  Location: [facility];  Service: Endoscopy;  Laterality: N/A;  . EP IMPLANTABLE DEVICE N/A 10/10/2016   Procedure: ICD Implant;  Surgeon: Will Meredith Leeds, MD;  Location: [facility];  Service: Cardiovascular;  Laterality: N/A;  . LUMBAR LAMINECTOMY/DECOMPRESSION MICRODISCECTOMY N/A 01/18/2018   Procedure: L2-3, L3-4, L-4-5 CENTRAL DECOMPRESSION;  Surgeon: [name], MD;  Location: [facility];  Service: Orthopedics;  Laterality: N/A;  . POLYPECTOMY  08/30/2018   Procedure: POLYPECTOMY;  Surgeon: Carol Ada, MD;  Location: [facility];  Service: Endoscopy;;       Family History  Problem Relation Age of Onset  . Heart attack Father        died in his 31's  . Hypertension Sister   . Cancer Brother        uncertain, "leg"  . Hypertension Sister     Social History   Tobacco Use  . Smoking status: Never Smoker  . Smokeless tobacco: Never Used  Substance Use Topics  . Alcohol use: No  . Drug use: No    Home Medications Prior to Admission medications   Medication Sig Start Date End Date Taking? Authorizing Provider  allopurinol (ZYLOPRIM) 300 MG tablet Take 300 mg by mouth daily. 08/16/16   [provider]  amiodarone (PACERONE) 100 MG tablet Take 1 tablet (100 mg total) by mouth daily. 10/20/19   Hilty, Nadean Corwin, MD  atorvastatin (LIPITOR) 40 MG tablet Take 1 tablet (40 mg total) by mouth daily at 6 PM. 10/27/16   Camnitz, Ocie Doyne, MD  carvedilol (COREG) 25 MG tablet Take 1 tablet (25 mg total) by mouth 2 (two) times daily with a meal. 05/31/18   Duke, Tami Lin, PA  [employer] VITAMIN D3 1000 units capsule Take 1,000 Units by mouth daily. 08/16/16   [provider]  ELIQUIS 5 MG TABS tablet TAKE 1 TABLET BY MOUTH TWICE A DAY 09/09/19   Hilty, Nadean Corwin, MD  furosemide (LASIX) 80 MG tablet Take 1 tablet (80 mg total) by mouth daily. 10/20/19   Hilty, Nadean Corwin, MD  magnesium oxide (MAG-OX) 400 MG tablet Take 200 mg by mouth 2 (two) times daily.    [provider]  pantoprazole (PROTONIX) 40 MG tablet Take 1 tablet (40 mg total) by mouth daily. 07/01/18   Duke, Tami Lin, PA  potassium chloride  SA (K-DUR) 20 MEQ tablet Take 2 tablets (40 mEq total) by mouth daily. 06/07/19   Swayze, Ava, DO  spironolactone (ALDACTONE) 25 MG tablet Take 0.5 tablets (12.5 mg total) by mouth daily. 06/08/19   Swayze, Ava, DO  valsartan (DIOVAN) 160 MG tablet Take 1 tablet (160 mg total) by mouth daily. 07/15/19   Pixie Casino, MD    Allergies    Contrast media [iodinated diagnostic agents] and Lisinopril  Review of Systems   Review of Systems  Constitutional: Negative for fever.  Respiratory: Positive for shortness of breath.   Cardiovascular: Negative for chest pain.  Gastrointestinal: Positive for diarrhea. Negative for abdominal pain and vomiting.  Neurological: Positive for weakness.  All other systems reviewed and are negative.   Physical Exam Updated Vital Signs BP [name])  157/58   Pulse 73   Temp 97.8 F (36.6 C) (Oral)   Resp 17   SpO2 100%   Physical Exam CONSTITUTIONAL: Well developed/well nourished HEAD: Normocephalic/atraumatic EYES: EOMI/PERRL ENMT: Mucous membranes moist NECK: supple no meningeal signs SPINE/BACK:entire spine nontender CV: S1/S2 noted, no murmurs/rubs/gallops noted LUNGS: Lungs are clear to auscultation bilaterally, no apparent distress ABDOMEN: soft, nontender, no rebound or guarding, bowel sounds noted throughout abdomen Rectal-stool appears gray, no blood, Hemoccult negative.  No rectal mass.  Prostate is not enlarged.  Male chaperone present for exam GU:no cva tenderness NEURO: Pt is awake/alert/appropriate, moves all extremitiesx4.  No facial droop.  No focal weakness EXTREMITIES: pulses normal/equal, full ROM SKIN: warm, color normal PSYCH: no abnormalities of mood noted, alert and oriented to situation  ED Results / Procedures / Treatments   Labs (all labs ordered are listed, but only abnormal results are displayed) Labs Reviewed  COMPREHENSIVE METABOLIC PANEL - Abnormal; Notable for the following components:      Result Value   Sodium 134  (*)    Potassium 7.3 (*)    CO2 16 (*)    Glucose, Bld 121 (*)    BUN 70 (*)    Creatinine, Ser 3.82 (*)    Calcium 10.4 (*)    Total Bilirubin 0.2 (*)    GFR calc non Af Amer 15 (*)    GFR calc Af Amer 18 (*)    All other components within normal limits  CBC - Abnormal; Notable for the following components:   WBC 14.0 (*)    RBC 3.99 (*)    Hemoglobin 11.7 (*)    HCT 38.0 (*)    All other components within normal limits  URINALYSIS, ROUTINE W REFLEX MICROSCOPIC - Abnormal; Notable for the following components:   Leukocytes,Ua MODERATE (*)    All other components within normal limits  POCT I-STAT EG7 - Abnormal; Notable for the following components:   Acid-base deficit 4.0 (*)    Potassium 6.7 (*)    Calcium, Ion 1.45 (*)    HCT 31.0 (*)    Hemoglobin 10.5 (*)    All other components within normal limits  RESPIRATORY PANEL BY RT PCR (FLU A&B, COVID)  URINE CULTURE  LIPASE, BLOOD  BASIC METABOLIC PANEL  POC OCCULT BLOOD, ED    EKG EKG Interpretation  Date/Time:  Thursday October 23 2019 02:32:45 EST Ventricular Rate:  72 PR Interval:    QRS Duration: 177 QT Interval:  459 QTC Calculation: 503 R Axis:   -54 Text Interpretation: Sinus rhythm Prolonged PR interval Nonspecific IVCD with LAD Consider anterolateral infarct Confirmed by Ripley Fraise [phone]) on 10/23/2019 2:36:49 AM   Radiology CT ABDOMEN PELVIS WO CONTRAST  Result Date: 10/23/2019 CLINICAL DATA:  Acute renal failure EXAM: CT ABDOMEN AND PELVIS WITHOUT CONTRAST TECHNIQUE: Multidetector CT imaging of the abdomen and pelvis was performed following the standard protocol without IV contrast. COMPARISON:  09/19/2018 FINDINGS: Lower chest: Coronary atherosclerosis. Pacer lead into the right ventricle. No acute finding Hepatobiliary: 3 dominant liver masses which correlate with history of hemangioma by MRI. The largest is in the subcapsular right liver at 5.5 cm maximum on axial slices.No evidence of biliary  obstruction or stone. Pancreas: Unremarkable. Spleen: Unremarkable. Adrenals/Urinary Tract: Negative adrenals. No hydronephrosis or stone. Physiologic distension of the bladder. Stomach/Bowel:  No obstruction. No appendicitis. Vascular/Lymphatic: No acute vascular abnormality. Extensive atherosclerotic calcification. No mass or adenopathy. Reproductive:Generalized prosthetic calcification, dystrophic and chronic. Other: No ascites or pneumoperitoneum. Musculoskeletal:  No acute abnormalities. Prominent spinal degeneration and spondylosis with multi-level bridging osteophyte or enthesophyte in the thoracic spine. 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 No acute finding. IMPRESSION: 1. No acute finding. There is moderate distension of the bladder without hydronephrosis. 2. Hepatic hemangiomas. 3. Colonic diverticulosis. 4.  Aortic Atherosclerosis (ICD10-I70.0). Electronically Signed   By: Monte Fantasia M.D.   On: 10/23/2019 04:31   DG Chest Port 1 View  Result Date: 10/23/2019 CLINICAL DATA:  Shortness of breath and diarrhea for 2 weeks EXAM: PORTABLE CHEST 1 VIEW COMPARISON:  Radiograph 06/03/2019, CT 09/08/2019 FINDINGS: Streaky basilar opacities favoring atelectasis given low volumes. Stable cardiomediastinal contours with a calcified aorta. AICD battery pack overlies the left chest wall with lead towards the cardiac apex. No pneumothorax. No effusion. No acute osseous or soft tissue abnormality. Degenerative changes are present in the imaged spine and shoulders. IMPRESSION: Basilar atelectasis, otherwise no acute cardiopulmonary abnormality. Electronically Signed   By: Lovena Le M.D.   On: 10/23/2019 02:23    Procedures .Critical Care Performed by: Ripley Fraise, MD Authorized by: Ripley Fraise, MD   Critical care provider statement:    Critical care time (minutes):  45   Critical care start time:  10/23/2019 3:45 AM   Critical care end time:  10/23/2019 4:30 AM   Critical care time was exclusive  of:  Separately billable procedures and treating other patients   Critical care was necessary to treat or prevent imminent or life-threatening deterioration of the following conditions:  Renal failure and dehydration   Critical care was time spent personally by me on the following activities:  Ordering and review of laboratory studies, ordering and review of radiographic studies, pulse oximetry, re-evaluation of patient's condition, review of old charts, examination of patient, evaluation of patient's response to treatment, discussions with consultants, development of treatment plan with patient or surrogate and ordering and performing treatments and interventions   I assumed direction of critical care for this patient from another provider in my specialty: no      Medications Ordered in ED Medications  furosemide (LASIX) injection 60 mg (has no administration in time range)  sodium chloride flush (NS) 0.9 % injection 3 mL (3 mLs Intravenous Given 10/23/19 0303)  calcium gluconate inj 10% (1 g) URGENT USE ONLY! (1 g Intravenous Given 10/23/19 0300)  sodium polystyrene (KAYEXALATE) 15 GM/60ML suspension 30 g (30 g Oral Given 10/23/19 0304)  sodium bicarbonate injection 100 mEq (100 mEq Intravenous Given 10/23/19 0345)  albuterol (PROVENTIL) (2.5 MG/3ML) 0.083% nebulizer solution 5 mg (5 mg Nebulization Given 10/23/19 0303)  dextrose 50 % solution 50 mL (50 mLs Intravenous Given 10/23/19 0346)  insulin aspart (novoLOG) injection 4 Units (4 Units Intravenous Given 10/23/19 0344)  sodium chloride 0.9 % bolus 250 mL (0 mLs Intravenous Stopped 10/23/19 0435)  sodium bicarbonate injection 50 mEq (50 mEq Intravenous Given 10/23/19 0513)    ED Course  I have reviewed the triage vital signs and the nursing notes.  Pertinent labs & imaging results that were available during my care of the patient were reviewed by me and considered in my medical decision making (see chart for details).    MDM  Rules/Calculators/A&P                      3:45 AM Patient presents with diarrhea for the past 2 weeks and feeling generalized weakness.  Labs reveal acute renal failure with hyperkalemia with potassium over 7.  Heart rate is appropriate, does  have a widened QRS but that has been seen previously. Consulted with nephrology Dr. Royce Macadamia.  She gave medication orders including calcium, 2 Amps of bicarb, albuterol, insulin and glucose, and 250 mL sodium chloride bolus and Kayexalate.  Patient is awake alert and reports feeling well.  He is having adequate urine output. Dr. foster also requested a CT abdomen pelvis 5:18 AM Patient stable at this time.  He denies any new complaints.  Discussed with nephrology who recommends another 250 cc bolus of fluids as well as bicarb.  Dr. Royce Macadamia with nephrology also recommends Foley placement Pt to be admitted to the hospitalist service 5:29 AM Discussed with Dr. Marlowe Sax for admission to the hospital  Final Clinical Impression(s) / ED Diagnoses Final diagnoses:  AKI (acute kidney injury) ([place])  Hyperkalemia  Dehydration  Diarrhea, unspecified type    Rx / DC Orders ED Discharge Orders    None       Ripley Fraise, MD 10/23/19 [phone]

## 2019-10-23 NOTE — ED Notes (Signed)
Lunch Tray Ordered @ 1026. 

## 2019-10-23 NOTE — H&P (Signed)
History and Physical    Dean Rehbein Sr. KT:[id number] DOB: 04-20-1951 DOA: 10/23/2019  PCP: [name], MD Patient coming from: Home  Chief Complaint: Diarrhea, generalized weakness  HPI: Dean Jeannot Sr. is a 69 y.o. 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  Patient reports 2-week history of intermittent diarrhea.  Sometimes the diarrhea is watery and sometimes dark in color.  He has been taking over-the-counter medications including Pepto-Bismol and Imodium.  No abdominal pain, nausea, or vomiting.  No fevers or chills.  No recent antibiotic use.  No recent sick contacts.  No cough, shortness of breath, or chest pain.  ED Course: Afebrile.  WBC count 14.0.  Hemoglobin 11.7, at baseline.  FOBT negative.  Potassium 7.3.  EKG with PR prolongation and widened QRS complexes similar to prior tracing.  Bicarb 16, anion gap 9.  Blood glucose 121.  BUN 70, creatinine 3.8.  Baseline creatinine 1.2.  UA with negative nitrite, moderate amount of leukocytes, 0-5 WBCs, and no bacteria on microscopic examination.  Urine culture pending.  SARS-CoV-2 PCR test negative. Chest x-ray showing bibasilar atelectasis, no acute cardiopulmonary abnormality. CT abdomen pelvis showing moderate distention of the bladder without hydronephrosis. ED provider discussed the case with Dr. Royce Macadamia from nephrology who recommended medical treatment for hyperkalemia including calcium gluconate, bicarb, albuterol, insulin/D50, and Kayexalate.  In addition, patient received a 250 cc normal saline bolus.  Repeat potassium 6.7.  Review of Systems:  All systems reviewed and apart from history of presenting illness, are negative.  Past Medical  History:  Diagnosis Date  . 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 Arthritis    "maybe in his spine" (09/05/2016)  . Asthma    pt. denies  . Cardiac arrest ([place]) 08/27/2016   REQUIRING CPR, SHOCK & MEDICATIONS  . CHF (congestive heart failure) ([place])   . Coronary artery disease    90% ostRCA stenosis and total occlusion of dRCA with L-->R collaterals 2017  . Dysrhythmia   . Gout   . High cholesterol   . Hypertension   . Ischemic cardiomyopathy   . LBBB (left bundle branch block)   . OSA on CPAP   . Type II diabetes mellitus ([place])     Past Surgical History:  Procedure Laterality Date  . CARDIAC CATHETERIZATION N/A 09/07/2016   Procedure: Left Heart Cath and Coronary Angiography;  Surgeon: Peter M Martinique, MD;  Location: [facility];  Service: Cardiovascular;  Laterality: N/A;  . COLONOSCOPY WITH PROPOFOL N/A 08/30/2018   Procedure: COLONOSCOPY WITH PROPOFOL;  Surgeon: Carol Ada, MD;  Location: [facility];  Service: Endoscopy;  Laterality: N/A;  . EP IMPLANTABLE DEVICE N/A 10/10/2016   Procedure: ICD Implant;  Surgeon: Will Meredith Leeds, MD;  Location: [facility];  Service: Cardiovascular;  Laterality: N/A;  . LUMBAR LAMINECTOMY/DECOMPRESSION MICRODISCECTOMY N/A 01/18/2018   Procedure: L2-3, L3-4, L-4-5 CENTRAL DECOMPRESSION;  Surgeon: [name], MD;  Location: [facility];  Service: Orthopedics;  Laterality: N/A;  . POLYPECTOMY  08/30/2018   Procedure: POLYPECTOMY;  Surgeon: Carol Ada, MD;  Location: [facility];  Service: Endoscopy;;     reports that he has never smoked. He has never used smokeless tobacco. He reports that he does not drink alcohol or use drugs.  Allergies  Allergen Reactions  .  Contrast Media [Iodinated Diagnostic Agents] Shortness Of Breath and Nausea And Vomiting  . Lisinopril Cough    Family History  Problem Relation Age of Onset  . Heart attack Father        died in his 42's  . Hypertension Sister     . Cancer Brother        uncertain, "leg"  . Hypertension Sister     Prior to Admission medications   Medication Sig Start Date End Date Taking? Authorizing Provider  allopurinol (ZYLOPRIM) 300 MG tablet Take 300 mg by mouth daily. 08/16/16   [provider]  amiodarone (PACERONE) 100 MG tablet Take 1 tablet (100 mg total) by mouth daily. 10/20/19   Hilty, Nadean Corwin, MD  atorvastatin (LIPITOR) 40 MG tablet Take 1 tablet (40 mg total) by mouth daily at 6 PM. 10/27/16   Camnitz, Ocie Doyne, MD  carvedilol (COREG) 25 MG tablet Take 1 tablet (25 mg total) by mouth 2 (two) times daily with a meal. 05/31/18   Duke, Tami Lin, PA  [employer] VITAMIN D3 1000 units capsule Take 1,000 Units by mouth daily. 08/16/16   [provider]  ELIQUIS 5 MG TABS tablet TAKE 1 TABLET BY MOUTH TWICE A DAY 09/09/19   Hilty, Nadean Corwin, MD  furosemide (LASIX) 80 MG tablet Take 1 tablet (80 mg total) by mouth daily. 10/20/19   Hilty, Nadean Corwin, MD  magnesium oxide (MAG-OX) 400 MG tablet Take 200 mg by mouth 2 (two) times daily.    [provider]  pantoprazole (PROTONIX) 40 MG tablet Take 1 tablet (40 mg total) by mouth daily. 07/01/18   Duke, Tami Lin, PA  potassium chloride SA (K-DUR) 20 MEQ tablet Take 2 tablets (40 mEq total) by mouth daily. 06/07/19   Swayze, Ava, DO  spironolactone (ALDACTONE) 25 MG tablet Take 0.5 tablets (12.5 mg total) by mouth daily. 06/08/19   Swayze, Ava, DO  valsartan (DIOVAN) 160 MG tablet Take 1 tablet (160 mg total) by mouth daily. 07/15/19   Pixie Casino, MD    Physical Exam: Vitals:   10/23/19 0141 10/23/19 0230 10/23/19 0234 10/23/19 0236  BP:  (!) 155/46 (!) 151/43 (!) 157/58  Pulse: 81 71 73   Resp: (!) 24  (!) 7 17  Temp: 97.8 F (36.6 C)     TempSrc: Oral     SpO2: 100% 100% 100%     Physical Exam  Constitutional: He is oriented to person, place, and time. He appears well-developed and well-nourished. No distress.  HENT:  Head: Normocephalic.   Eyes: Right eye exhibits no discharge. Left eye exhibits no discharge.  Cardiovascular: Normal rate, regular rhythm and intact distal pulses.  Pulmonary/Chest: Effort normal and breath sounds normal. No respiratory distress. He has no wheezes. He has no rales.  Abdominal: Soft. Bowel sounds are normal. He exhibits no distension. There is no abdominal tenderness. There is no guarding.  Musculoskeletal:        General: No edema.     Cervical back: Neck supple.  Neurological: He is alert and oriented to person, place, and time.  No focal motor weakness  Skin: Skin is warm and dry. He is not diaphoretic.     Labs on Admission: I have personally reviewed following labs and imaging studies  CBC: Recent Labs  Lab 10/23/19 0132 10/23/19 0447  WBC 14.0*  --   HGB 11.7* 10.5*  HCT 38.0* 31.0*  MCV 95.2  --   PLT 190  --  Basic Metabolic Panel: Recent Labs  Lab 10/23/19 0132 10/23/19 0447  NA 134* 139  K 7.3* 6.7*  CL 109  --   CO2 16*  --   GLUCOSE 121*  --   BUN 70*  --   CREATININE 3.82*  --   CALCIUM 10.4*  --    GFR: Estimated Creatinine Clearance: 21.7 mL/min (A) (by C-G formula based on SCr of 3.82 mg/dL (H)). Liver Function Tests: Recent Labs  Lab 10/23/19 0132  AST 25  ALT 28  ALKPHOS 98  BILITOT 0.2*  PROT 7.6  ALBUMIN 4.1   Recent Labs  Lab 10/23/19 0132  LIPASE 46   No results for input(s): AMMONIA in the last 168 hours. Coagulation Profile: No results for input(s): INR, PROTIME in the last 168 hours. Cardiac Enzymes: No results for input(s): CKTOTAL, CKMB, CKMBINDEX, TROPONINI in the last 168 hours. BNP (last 3 results) No results for input(s): PROBNP in the last 8760 hours. HbA1C: No results for input(s): HGBA1C in the last 72 hours. CBG: No results for input(s): GLUCAP in the last 168 hours. Lipid Profile: No results for input(s): CHOL, HDL, LDLCALC, TRIG, CHOLHDL, LDLDIRECT in the last 72 hours. Thyroid Function Tests: No results for  input(s): TSH, T4TOTAL, FREET4, T3FREE, THYROIDAB in the last 72 hours. Anemia Panel: No results for input(s): VITAMINB12, FOLATE, FERRITIN, TIBC, IRON, RETICCTPCT in the last 72 hours. Urine analysis:    Component Value Date/Time   COLORURINE YELLOW 10/23/2019 0155   APPEARANCEUR CLEAR 10/23/2019 0155   LABSPEC 1.010 10/23/2019 0155   PHURINE 5.0 10/23/2019 0155   GLUCOSEU NEGATIVE 10/23/2019 0155   HGBUR NEGATIVE 10/23/2019 0155   BILIRUBINUR NEGATIVE 10/23/2019 0155   KETONESUR NEGATIVE 10/23/2019 0155   PROTEINUR NEGATIVE 10/23/2019 0155   NITRITE NEGATIVE 10/23/2019 0155   LEUKOCYTESUR MODERATE (A) 10/23/2019 0155    Radiological Exams on Admission: CT ABDOMEN PELVIS WO CONTRAST  Result Date: 10/23/2019 CLINICAL DATA:  Acute renal failure EXAM: CT ABDOMEN AND PELVIS WITHOUT CONTRAST TECHNIQUE: Multidetector CT imaging of the abdomen and pelvis was performed following the standard protocol without IV contrast. COMPARISON:  09/19/2018 FINDINGS: Lower chest: Coronary atherosclerosis. Pacer lead into the right ventricle. No acute finding Hepatobiliary: 3 dominant liver masses which correlate with history of hemangioma by MRI. The largest is in the subcapsular right liver at 5.5 cm maximum on axial slices.No evidence of biliary obstruction or stone. Pancreas: Unremarkable. Spleen: Unremarkable. Adrenals/Urinary Tract: Negative adrenals. No hydronephrosis or stone. Physiologic distension of the bladder. Stomach/Bowel:  No obstruction. No appendicitis. Vascular/Lymphatic: No acute vascular abnormality. Extensive atherosclerotic calcification. No mass or adenopathy. Reproductive:Generalized prosthetic calcification, dystrophic and chronic. Other: No ascites or pneumoperitoneum. Musculoskeletal: No acute abnormalities. Prominent spinal degeneration and spondylosis with multi-level bridging osteophyte or enthesophyte in the thoracic spine. 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 No acute finding.  IMPRESSION: 1. No acute finding. There is moderate distension of the bladder without hydronephrosis. 2. Hepatic hemangiomas. 3. Colonic diverticulosis. 4.  Aortic Atherosclerosis (ICD10-I70.0). Electronically Signed   By: Monte Fantasia M.D.   On: 10/23/2019 04:31   DG Chest Port 1 View  Result Date: 10/23/2019 CLINICAL DATA:  Shortness of breath and diarrhea for 2 weeks EXAM: PORTABLE CHEST 1 VIEW COMPARISON:  Radiograph 06/03/2019, CT 09/08/2019 FINDINGS: Streaky basilar opacities favoring atelectasis given low volumes. Stable cardiomediastinal contours with a calcified aorta. AICD battery pack overlies the left chest wall with lead towards the cardiac apex. No pneumothorax. No effusion. No acute osseous or soft tissue abnormality.  Degenerative changes are present in the imaged spine and shoulders. IMPRESSION: Basilar atelectasis, otherwise no acute cardiopulmonary abnormality. Electronically Signed   By: Lovena Le M.D.   On: 10/23/2019 02:23    EKG: Independently reviewed.  Sinus rhythm, LBBB, PR prolongation, QTC 503, widened QRS complexes.  LBBB seen on prior tracing.  PR prolongation and widened QRS complexes seen on prior tracing from 10/20/2019 as well.  QT interval now worsened.  Assessment/Plan Principal Problem:   Hyperkalemia Active Problems:   DM2 (diabetes mellitus, type 2) (H[facility])   Essential hypertension, benign   AKI (acute kidney injury) ([place])   Diarrhea   Severe hyperkalemia Suspect related to AKI an home medications including potassium supplement, spironolactone, and valsartan use. Potassium 7.3.  EKG with PR prolongation and widened QRS complexes similar to prior tracing. -Patient received medical therapy for hyperkalemia including calcium gluconate, bicarb, albuterol, insulin/D50, and Kayexalate.  In addition, received a 250 cc normal saline bolus. Potassium 6.7 on repeat labs.  Seen by nephrology.  Recommending additional 250 cc normal saline bolus, additional sodium  bicarb, and IV Lasix 60 mg once. -Hold home medications including potassium supplement, spironolactone, and valsartan -Repeat BMP and monitor very closely.  May need dialysis if no improvement with medical therapy.  AKI on CKD stage III Likely prerenal due to GI loss/dehydration in the setting of ongoing diarrhea, spironolactone, valsartan, and home Lasix use.  BUN 70, creatinine 3.8.  Baseline creatinine 1.2. CT without evidence of hydronephrosis. -Foley catheter placed due to distended bladder seen on CT -Hold home spironolactone, valsartan -Single dose of IV Lasix as above for hyperkalemia -Gentle IV fluid hydration -Continue to monitor renal function and urine output  Diarrhea Patient reports 2-week history of diarrhea. Abdominal exam benign.  WBC count 14.0. CT without evidence of colitis.  Reports dark stools but hemoglobin at baseline and FOBT negative. -Gentle IV fluid hydration -GI pathogen panel -C. difficile PCR -Continue to monitor WBC count -SARS-CoV-2 PCR test pending.  Continue airborne and contact precautions.  Normal anion gap metabolic acidosis Likely related to AKI.  Bicarb 16, anion gap 9.  -Bicarb supplementation as above -Continue to monitor  Hypertension Systolic currently in Q000111Q. -Hydralazine as needed for SBP greater than 160 -Hold home ARB and diuretics  Ischemic cardiomyopathy Echo done July 2020 with EF 30 to 35%.  No signs of volume overload at this time. -Hold home diuretics.  One-time Lasix as mentioned above for hyperkalemia.  OSA on CPAP -Continue CPAP at night  Non-insulin-dependent diabetes mellitus -Check A1c.  Sliding scale insulin very sensitive ACHS and CBG checks.  Paroxysmal atrial fibrillation Currently in sinus rhythm.  Takes Eliquis for anticoagulation.  Generalized weakness Suspect related to acute illness/diarrhea.  No focal motor weakness. -PT and OT evaluation  Pharmacy med rec pending.  DVT prophylaxis: On chronic  anticoagulation with Eliquis Code Status: Patient wishes to be full code. Family Communication: No family available at this time. Disposition Plan: Anticipate discharge after clinical improvement. Consults called: Nephrology Admission status: It is my clinical opinion that admission to [place] is reasonable and necessary in this 69 y.o. male . presenting severe hyperkalemia and AKI on CKD stage III.  Receiving medical therapy for hyperkalemia and may need dialysis if no improvement.  Patient also need IV fluid hydration and work-up for diarrhea.  Given the aforementioned, the predictability of an adverse outcome is felt to be significant. I expect that the patient will require at least 2 midnights in the hospital to treat this  condition.   The medical decision making on this patient was of high complexity and the patient is at high risk for clinical deterioration, therefore this is a level 3 visit.  [name] MD [facility]  If 7PM-7AM, please contact night-coverage [email or web address] Password [facility]  10/23/2019, 5:48 AM

## 2019-10-23 NOTE — Progress Notes (Signed)
Care started early this morning after midnight when patient presented to the emergency room and he was admitted early this morning after midnight by my partner and colleague Dr. [name] and current agreement with her assessment and plan.  Additional changes of plan of care been made accordingly.  The patient is a 69 year old morbidly obese African-American male with a past medical history significant for but not limited to CAD, ischemic cardiomyopathy, proximal atrial fibrillation, history of left bundle branch block, history of V. tach and V. fib arrest status post AICD, hypertension, hyperlipidemia, gout, OSA on CPAP, non-insulin-dependent diabetes mellitus, CKD stage III as well as other comorbidities presented to the ED with chief complaints of diarrhea and generalized weakness for few weeks.  For last few weeks she has been having intermittent diarrhea and states that it is sometimes watery and dark in color.  He admitted to taking over-the-counter medications including Pepto-Bismol and Imodium but denies any abdominal pain nausea or vomiting.  On presentation to the ED he was found to have a WBC of 14,000 and hemoglobin 11.7 and FOBT was negative.  Of note his potassium was 7.3 and EKG showed PR prolongation with widened QRS complexes and he also did have a metabolic acidosis in addition to an acute kidney injury on chronic kidney disease stage III with initial presenting BUN/creatinine of 70/3.82.  A CT of the abdomen pelvis was done which showed moderate central bladder without hydronephrosis and nephrology was consulted for further evaluation and recommended medical treatment for hyperkalemia including calcium gluconate, bicarbonate, albuterol nebulizers, insulin and D50 as well as Kayexalate.  In addition patient received a 250 normal sign bolus.  His potassium is now improving and was 6.2 and his BUN/creatinine is now 60/3.39.  Patient has been admitted for an being treated for the following  but not limited to:  Severe Hyperkalemia -Suspect related to AKI an home medications including potassium supplement, spironolactone, and valsartan use. Potassium 7.3.  EKG with PR prolongation and widened QRS complexes similar to prior tracing. -Patient received medical therapy for hyperkalemia including calcium gluconate, bicarb, albuterol, insulin/D50, and Kayexalate.  In addition, received a 250 cc normal saline bolus. Potassium 6.7 on repeat labs has now trended down to 6.2.   -Seen by nephrology.  Recommending additional 250 cc normal saline bolus, additional sodium bicarb, and IV Lasix 60 mg once. -Hold home medications including potassium supplement, spironolactone, and valsartan -Repeat BMP and monitor very closely.  May need dialysis if no improvement with medical therapy.   AKI on CKD stage III -Likely prerenal due to GI loss/dehydration in the setting of ongoing diarrhea, spironolactone, valsartan, and home Lasix use.  BUN 70, creatinine 3.8.  Baseline creatinine 1.2. CT without evidence of hydronephrosis. -Patient's BUN/creatinine went from 70/3.82 is now 60/3.39 -Foley catheter placed due to distended bladder seen on CT -Hold home spironolactone, valsartan -Single dose of IV Lasix as above for hyperkalemia -Gentle IV fluid hydration -Continue to monitor renal function and urine output  Diarrhea Patient reports 2-week history of diarrhea. Abdominal exam benign.  WBC count 14.0. CT without evidence of colitis.  Reports dark stools but hemoglobin at baseline and FOBT negative. -Gentle IV fluid hydration -GI pathogen panel -C. difficile PCR -Continue to monitor WBC count -SARS-CoV-2 PCR test pending.  Continue airborne and contact precautions.  Normal anion gap metabolic acidosis -Likely related to AKI.  Bicarb 16, anion gap 9.  -Bicarb supplementation as above -Continue to monitor  Hypertension -Systolic currently in Q000111Q. -Hydralazine as needed  for SBP greater than  160 -Hold home ARB and diuretics given his renal dysfunction  Ischemic cardiomyopathy -Echo done July 2020 with EF 30 to 35%.  No signs of volume overload at this time. -Hold home diuretics.  One-time Lasix as mentioned above for hyperkalemia. -Continue to monitor for signs and symptoms of volume overload given gentle IV hydration  OSA on CPAP -Continue CPAP at night  Non-insulin-dependent diabetes mellitus -Check A1c.  Sliding scale insulin very sensitive ACHS and CBG checks. -Last hemoglobin A1c was 5.6 -Blood sugar on last BMP was 160s and CBG was 84  Paroxysmal atrial fibrillation -Currently in sinus rhythm.  Takes Eliquis for anticoagulation. -Need to evaluate for signs and symptoms of bleeding given his dark stools -Hemoglobin/hematocrit on admission was 9.7/38.2 is now 10.5/31.0 next-continue telemetry monitoring  Generalized weakness -Suspect related to acute illness/diarrhea.  No focal motor weakness. -PT and OT evaluation  We will continue to monitor patient's clinical response to intervention and repeat blood work in the a.m. and follow Nephrology recommendations.

## 2019-10-23 NOTE — ED Notes (Signed)
Will administer remainder of oral meds once nebulizer treatment finished

## 2019-10-23 NOTE — ED Notes (Signed)
This RN bladder scanned pt, bladder scan revealed 108mL. Bladder scan finding conveyed to Nephrologist & charge Claiborne Billings

## 2019-10-23 NOTE — Consult Note (Signed)
[facility] Renal Consultation Note  Requesting DN:[id number] Christy Gentles, MD  Indication for Consultation: hyperkalemia, AKI   Chief complaint: weakness   HPI: Dean Scism Sr. is a 69 y.o. 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  He also states that he feels as though he has lost weight recently.  He denies any shortness of breath to me.  He denies any nausea or vomiting.  He states that he is urinating well.  He states that his PCP had him hold Lasix a couple of weeks ago and instead was directing him to use this on an as needed basis.  He has not had to use lasix and then Lasix was restarted at 80 mg daily at a recent appointment a couple of days ago with cardiology - he states he started back 80 mg daily on 1/26.  He is on valsartan, spironolactone, and potassium supplementation at home.  He denies any NSAID use and reports occasional Tylenol. In the ER, the patient's potassium was elevated at 7.3 initially with a creatinine of 3.8.  He received bicarbonate x2, albuterol neb, insulin 4 units, normal saline gentle bolus 250 mL, calcium and kayexalate.  He would want dialysis if it was indicated -he had renal failure in June 2020 and did not require dialysis at that time.  He was seen in patient by Dr. Moshe Cipro and Dr. Joelyn Oms.  Peak creatinine 5.92.  He has not been seen in the office.  He indicates that improved with holding his diuretics.  Potassium improved to 6.7 on repeat.  CT abdomen pelvis was notable for distended bladder and he is okay with having a Foley placed.    Creatinine, Ser  Date/Time Value Ref Range Status  10/23/2019 01:32 AM 3.82 (H) 0.61 - 1.24 mg/dL Final  06/07/2019 09:44 AM 1.56 (H) 0.61 - 1.24 mg/dL Final  06/06/2019 04:13 AM 1.29  (H) 0.61 - 1.24 mg/dL Final  06/05/2019 04:59 AM 1.31 (H) 0.61 - 1.24 mg/dL Final  06/04/2019 04:02 AM 1.23 0.61 - 1.24 mg/dL Final  06/03/2019 03:03 PM 1.30 (H) 0.61 - 1.24 mg/dL Final  05/21/2019 10:54 AM 1.58 (H) 0.76 - 1.27 mg/dL Final  05/07/2019 04:00 AM 1.74 (H) 0.61 - 1.24 mg/dL Final  05/06/2019 05:26 AM 1.67 (H) 0.61 - 1.24 mg/dL Final  05/05/2019 07:20 PM 2.02 (H) 0.61 - 1.24 mg/dL Final  04/09/2019 04:20 AM 1.39 (H) 0.61 - 1.24 mg/dL Final  04/08/2019 07:50 AM 1.34 (H) 0.61 - 1.24 mg/dL Final  04/07/2019 04:11 AM 1.42 (H) 0.61 - 1.24 mg/dL Final  04/06/2019 04:59 PM 1.40 (H) 0.61 - 1.24 mg/dL Final  03/10/2019 11:52 AM 2.39 (H) 0.76 - 1.27 mg/dL Final  03/02/2019 04:43 AM 2.64 (H) 0.61 - 1.24 mg/dL Final  03/01/2019 03:16 AM 3.54 (H) 0.61 - 1.24 mg/dL Final  02/28/2019 03:27 PM 4.71 (H) 0.61 - 1.24 mg/dL Final  02/28/2019 01:34 AM 5.46 (H) 0.61 - 1.24 mg/dL Final  02/27/2019 03:37 PM 5.92 (H) 0.61 - 1.24 mg/dL Final  09/19/2018 12:25 PM 1.60 (H) 0.61 - 1.24 mg/dL Final  08/09/2018 03:13 PM 2.02 (H) 0.76 - 1.27 mg/dL Final  07/25/2018 02:53 PM 1.57 (H) 0.76 - 1.27 mg/dL Final  07/10/2018 12:44 PM 1.12 0.76 - 1.27 mg/dL Final  06/07/2018 01:51 PM 1.51 (H) 0.76 -  1.27 mg/dL Final  05/31/2018 10:00 AM 2.08 (H) 0.76 - 1.27 mg/dL Final  05/21/2018 04:38 AM 1.63 (H) 0.61 - 1.24 mg/dL Final  05/20/2018 06:21 AM 1.66 (H) 0.61 - 1.24 mg/dL Final  05/19/2018 06:02 AM 1.68 (H) 0.61 - 1.24 mg/dL Final  05/18/2018 04:00 AM 1.48 (H) 0.61 - 1.24 mg/dL Final  05/17/2018 05:25 AM 1.45 (H) 0.61 - 1.24 mg/dL Final  05/16/2018 01:53 AM 1.10 0.61 - 1.24 mg/dL Final  05/15/2018 09:50 AM 1.29 (H) 0.61 - 1.24 mg/dL Final  01/19/2018 06:37 AM 1.85 (H) 0.61 - 1.24 mg/dL Final  01/11/2018 11:50 AM 1.44 (H) 0.61 - 1.24 mg/dL Final  12/17/2017 09:52 AM 1.34 (H) 0.76 - 1.27 mg/dL Final  10/05/2017 09:26 PM 1.35 (H) 0.61 - 1.24 mg/dL Final  07/04/2017 11:31 AM 1.22 0.76 - 1.27 mg/dL Final  10/05/2016  11:11 AM 1.36 (H) 0.76 - 1.27 mg/dL Final  09/19/2016 04:49 AM 1.25 (H) 0.61 - 1.24 mg/dL Final  09/18/2016 05:21 AM 1.27 (H) 0.61 - 1.24 mg/dL Final  09/17/2016 05:32 AM 1.04 0.61 - 1.24 mg/dL Final  09/16/2016 05:37 AM 1.05 0.61 - 1.24 mg/dL Final  09/14/2016 04:44 AM 1.09 0.61 - 1.24 mg/dL Final  09/13/2016 04:02 AM 1.12 0.61 - 1.24 mg/dL Final  09/12/2016 05:40 AM 1.34 (H) 0.61 - 1.24 mg/dL Final  09/10/2016 04:44 AM 1.23 0.61 - 1.24 mg/dL Final  09/08/2016 02:56 AM 1.12 0.61 - 1.24 mg/dL Final  09/07/2016 11:55 AM 1.09 0.61 - 1.24 mg/dL Final  09/07/2016 08:44 AM 1.07 0.61 - 1.24 mg/dL Final  09/07/2016 02:27 AM 1.17 0.61 - 1.24 mg/dL Final  09/06/2016 04:31 AM 1.05 0.61 - 1.24 mg/dL Final     PMHx:   Past Medical History:  Diagnosis Date  . 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 Arthritis    "maybe in his spine" (09/05/2016)  . Asthma    pt. denies  . Cardiac arrest ([place]) 08/27/2016   REQUIRING CPR, SHOCK & MEDICATIONS  . CHF (congestive heart failure) ([place])   . Coronary artery disease    90% ostRCA stenosis and total occlusion of dRCA with L-->R collaterals 2017  . Dysrhythmia   . Gout   . High cholesterol   . Hypertension   . Ischemic cardiomyopathy   . LBBB (left bundle branch block)   . OSA on CPAP   . Type II diabetes mellitus ([place])     Past Surgical History:  Procedure Laterality Date  . CARDIAC CATHETERIZATION N/A 09/07/2016   Procedure: Left Heart Cath and Coronary Angiography;  Surgeon: Peter M Martinique, MD;  Location: [facility];  Service: Cardiovascular;  Laterality: N/A;  . COLONOSCOPY WITH PROPOFOL N/A 08/30/2018   Procedure: COLONOSCOPY WITH PROPOFOL;  Surgeon: Carol Ada, MD;  Location: [facility];  Service: Endoscopy;  Laterality: N/A;  . EP IMPLANTABLE DEVICE N/A 10/10/2016   Procedure: ICD Implant;  Surgeon: Will Meredith Leeds, MD;  Location: [facility];  Service: Cardiovascular;  Laterality: N/A;  .  LUMBAR LAMINECTOMY/DECOMPRESSION MICRODISCECTOMY N/A 01/18/2018   Procedure: L2-3, L3-4, L-4-5 CENTRAL DECOMPRESSION;  Surgeon: [name], MD;  Location: [facility];  Service: Orthopedics;  Laterality: N/A;  . POLYPECTOMY  08/30/2018   Procedure: POLYPECTOMY;  Surgeon: Carol Ada, MD;  Location: [facility];  Service: Endoscopy;;    Family Hx:  Family History  Problem Relation Age of Onset  . Heart attack Father        died in his 46's  . Hypertension  Sister   . Cancer Brother        uncertain, "leg"  . Hypertension Sister   no family history of renal failure   Social History:  reports that he has never smoked. He has never used smokeless tobacco. He reports that he does not drink alcohol or use drugs.  Allergies:  Allergies  Allergen Reactions  . Contrast Media [Iodinated Diagnostic Agents] Shortness Of Breath and Nausea And Vomiting  . Lisinopril Cough    Medications: Prior to Admission medications   Medication Sig Start Date End Date Taking? Authorizing Provider  allopurinol (ZYLOPRIM) 300 MG tablet Take 300 mg by mouth daily. 08/16/16   [provider]  amiodarone (PACERONE) 100 MG tablet Take 1 tablet (100 mg total) by mouth daily. 10/20/19   Hilty, Nadean Corwin, MD  atorvastatin (LIPITOR) 40 MG tablet Take 1 tablet (40 mg total) by mouth daily at 6 PM. 10/27/16   Camnitz, Ocie Doyne, MD  carvedilol (COREG) 25 MG tablet Take 1 tablet (25 mg total) by mouth 2 (two) times daily with a meal. 05/31/18   Duke, Tami Lin, PA  [employer] VITAMIN D3 1000 units capsule Take 1,000 Units by mouth daily. 08/16/16   [provider]  ELIQUIS 5 MG TABS tablet TAKE 1 TABLET BY MOUTH TWICE A DAY 09/09/19   Hilty, Nadean Corwin, MD  furosemide (LASIX) 80 MG tablet Take 1 tablet (80 mg total) by mouth daily. 10/20/19   Hilty, Nadean Corwin, MD  magnesium oxide (MAG-OX) 400 MG tablet Take 200 mg by mouth 2 (two) times daily.    [provider]  pantoprazole (PROTONIX) 40 MG tablet Take  1 tablet (40 mg total) by mouth daily. 07/01/18   Duke, Tami Lin, PA  potassium chloride SA (K-DUR) 20 MEQ tablet Take 2 tablets (40 mEq total) by mouth daily. 06/07/19   Swayze, Ava, DO  spironolactone (ALDACTONE) 25 MG tablet Take 0.5 tablets (12.5 mg total) by mouth daily. 06/08/19   Swayze, Ava, DO  valsartan (DIOVAN) 160 MG tablet Take 1 tablet (160 mg total) by mouth daily. 07/15/19   Hilty, Nadean Corwin, MD    I have reviewed the patient's current medications.  Labs:  BMP Latest Ref Rng & Units 10/23/2019 10/23/2019 06/07/2019  Glucose 70 - 99 mg/dL - 121(H) 156(H)  BUN 8 - 23 mg/dL - 70(H) 20  Creatinine 0.61 - 1.24 mg/dL - 3.82(H) 1.56(H)  BUN/Creat Ratio 10 - 24 - - -  Sodium 135 - 145 mmol/L 139 134(L) 137  Potassium 3.5 - 5.1 mmol/L 6.7(HH) 7.3(HH) 4.3  Chloride 98 - 111 mmol/L - 109 102  CO2 22 - 32 mmol/L - 16(L) 25  Calcium 8.9 - 10.3 mg/dL - 10.4(H) 9.9    Urinalysis    Component Value Date/Time   COLORURINE YELLOW 10/23/2019 0155   APPEARANCEUR CLEAR 10/23/2019 0155   LABSPEC 1.010 10/23/2019 0155   PHURINE 5.0 10/23/2019 0155   GLUCOSEU NEGATIVE 10/23/2019 0155   HGBUR NEGATIVE 10/23/2019 0155   BILIRUBINUR NEGATIVE 10/23/2019 0155   KETONESUR NEGATIVE 10/23/2019 0155   PROTEINUR NEGATIVE 10/23/2019 0155   NITRITE NEGATIVE 10/23/2019 0155   LEUKOCYTESUR MODERATE (A) 10/23/2019 0155     ROS:  Pertinent items noted in HPI and remainder of comprehensive ROS otherwise negative.     Physical Exam: Vitals:   10/23/19 0234 10/23/19 0236  BP: (!) 151/43 (!) 157/58  Pulse: 73   Resp: (!) 7 17  Temp:    SpO2: 100%  General: Adult male in stretcher no acute distress at rest HEENT: Normocephalic atraumatic Eyes: Extraocular movements intact sclera anicteric Neck: Supple trachea midline Heart: S1-S2 appreciated no rubs Lungs: Clear to auscultation bilaterally and unlabored on room air Abdomen: Abdomen is soft with obese habitus and nontender normal bowel  sounds Extremities: No edema appreciated no cyanosis or clubbing Skin: No rash on extremities exposed Neuro: Alert and oriented x3 provides a history and follows commands psych normal mood and affect  genitourinary no Foley in place urinal at bedside  Assessment/Plan:  # Hyperkalemia - Setting of AKI as well as potassium supplementation, valsartan, and spironolactone use - Appreciate administration of temporizing medications above per HPI - in addition, we will give normal saline repeat bolus now and additional amp of bicarbonate - for sake of hyperkalemia management will give lasix 60 mg IV once  - Placing Foley as below - Holding potassium valsartan and spironolactone - He does want dialysis if potassium does not improve - repeat BMP   # Acute kidney injury - Setting of prerenal insults with GI losses and spironolactone, valsartan, and some Lasix though was only recently reinitiated. Ua neg protein and noted 6-10 RBC - Appreciate CT scan  - Place Foley given bladder distention on CT scan - Please hold potassium, valsartan, and spironolactone - single dose of lasix as above for potassium  - Gentle bolus as tolerated as above   # CKD stage III - baseline Cr 1.4 -1.6 recently (last 1.56 on 0000000)  # Metabolic acidosis - s/p bicarb as above  - Setting of AKI  - bicarb orally for now   # Chronic systolic CHF - no overt overload on exam; breathing comfortably on room air - Lasix one-time only for potassium.  Holding spironolactone and valsartan  # Hypertension - Hold spironolactone and valsartan  # Anemia normocytic with report of dark stools - Work-up per primary team - No acute indication for packed red blood cells   Claudia Desanctis 10/23/2019, 5:15 AM

## 2019-10-24 DIAGNOSIS — R251 Tremor, unspecified: Secondary | ICD-10-CM

## 2019-10-24 DIAGNOSIS — D649 Anemia, unspecified: Secondary | ICD-10-CM

## 2019-10-24 LAB — CBC WITH DIFFERENTIAL/PLATELET
Abs Immature Granulocytes: 0.07 10*3/uL (ref 0.00–0.07)
Basophils Absolute: 0.1 10*3/uL (ref 0.0–0.1)
Basophils Relative: 0 %
Eosinophils Absolute: 0.3 10*3/uL (ref 0.0–0.5)
Eosinophils Relative: 3 %
HCT: 31.1 % — ABNORMAL LOW (ref 39.0–52.0)
Hemoglobin: 10 g/dL — ABNORMAL LOW (ref 13.0–17.0)
Immature Granulocytes: 1 %
Lymphocytes Relative: 18 %
Lymphs Abs: 2.3 10*3/uL (ref 0.7–4.0)
MCH: 29.1 pg (ref 26.0–34.0)
MCHC: 32.2 g/dL (ref 30.0–36.0)
MCV: 90.4 fL (ref 80.0–100.0)
Monocytes Absolute: 1 10*3/uL (ref 0.1–1.0)
Monocytes Relative: 8 %
Neutro Abs: 8.6 10*3/uL — ABNORMAL HIGH (ref 1.7–7.7)
Neutrophils Relative %: 70 %
Platelets: 149 10*3/uL — ABNORMAL LOW (ref 150–400)
RBC: 3.44 MIL/uL — ABNORMAL LOW (ref 4.22–5.81)
RDW: 13.3 % (ref 11.5–15.5)
WBC: 12.4 10*3/uL — ABNORMAL HIGH (ref 4.0–10.5)
nRBC: 0 % (ref 0.0–0.2)

## 2019-10-24 LAB — GLUCOSE, CAPILLARY
Glucose-Capillary: 73 mg/dL (ref 70–99)
Glucose-Capillary: 101 mg/dL — ABNORMAL HIGH (ref 70–99)
Glucose-Capillary: 91 mg/dL (ref 70–99)
Glucose-Capillary: 93 mg/dL (ref 70–99)

## 2019-10-24 LAB — URINE CULTURE: Culture: NO GROWTH

## 2019-10-24 LAB — COMPREHENSIVE METABOLIC PANEL
ALT: 29 U/L (ref 0–44)
AST: 25 U/L (ref 15–41)
Albumin: 3.6 g/dL (ref 3.5–5.0)
Alkaline Phosphatase: 94 U/L (ref 38–126)
Anion gap: 8 (ref 5–15)
BUN: 54 mg/dL — ABNORMAL HIGH (ref 8–23)
CO2: 24 mmol/L (ref 22–32)
Calcium: 9.8 mg/dL (ref 8.9–10.3)
Chloride: 105 mmol/L (ref 98–111)
Creatinine, Ser: 2.62 mg/dL — ABNORMAL HIGH (ref 0.61–1.24)
GFR calc Af Amer: 28 mL/min — ABNORMAL LOW (ref >60)
GFR calc non Af Amer: 24 mL/min — ABNORMAL LOW (ref >60)
Glucose, Bld: 95 mg/dL (ref 70–99)
Potassium: 5.3 mmol/L — ABNORMAL HIGH (ref 3.5–5.1)
Sodium: 137 mmol/L (ref 135–145)
Total Bilirubin: 0.9 mg/dL (ref 0.3–1.2)
Total Protein: 6.9 g/dL (ref 6.5–8.1)

## 2019-10-24 LAB — PHOSPHORUS: Phosphorus: 4.3 mg/dL (ref 2.5–4.6)

## 2019-10-24 LAB — MAGNESIUM: Magnesium: 1.7 mg/dL (ref 1.7–2.4)

## 2019-10-24 MED ORDER — CARVEDILOL 25 MG PO TABS
25.0000 mg | ORAL_TABLET | Freq: Two times a day (BID) | ORAL | Status: DC
  Administered 2019-10-24 – 2019-10-26 (×4): 25 mg via ORAL
  Filled 2019-10-24 (×4): qty 1

## 2019-10-24 MED ORDER — ATORVASTATIN CALCIUM 40 MG PO TABS
40.0000 mg | ORAL_TABLET | Freq: Every day | ORAL | Status: DC
  Administered 2019-10-24 – 2019-10-25 (×2): 40 mg via ORAL
  Filled 2019-10-24 (×2): qty 1

## 2019-10-24 MED ORDER — PANTOPRAZOLE SODIUM 40 MG PO TBEC
40.0000 mg | DELAYED_RELEASE_TABLET | Freq: Every day | ORAL | Status: DC
  Administered 2019-10-24 – 2019-10-26 (×3): 40 mg via ORAL
  Filled 2019-10-24 (×3): qty 1

## 2019-10-24 MED ORDER — MAGNESIUM OXIDE 400 (241.3 MG) MG PO TABS
400.0000 mg | ORAL_TABLET | Freq: Two times a day (BID) | ORAL | Status: DC
  Administered 2019-10-24 – 2019-10-26 (×5): 400 mg via ORAL
  Filled 2019-10-24 (×8): qty 1

## 2019-10-24 MED ORDER — AMIODARONE HCL 200 MG PO TABS
100.0000 mg | ORAL_TABLET | Freq: Every day | ORAL | Status: DC
  Administered 2019-10-24 – 2019-10-26 (×3): 100 mg via ORAL
  Filled 2019-10-24 (×3): qty 1

## 2019-10-24 MED ORDER — APIXABAN 5 MG PO TABS
5.0000 mg | ORAL_TABLET | Freq: Two times a day (BID) | ORAL | Status: DC
  Administered 2019-10-24 – 2019-10-26 (×5): 5 mg via ORAL
  Filled 2019-10-24 (×5): qty 1

## 2019-10-24 NOTE — Evaluation (Addendum)
Occupational Therapy Evaluation and Discharge Patient Details Name: Dean Islas Sr. MRN: [id number] DOB: [birth year] Today's Date: 10/24/2019    History of Present Illness Pt is a 69 y/o male admitted secondary to weakness and diarrhea. Pt found to have hyperkalemia. PMH includes HTN, gout, OSA on CPAP, CAD, ischemic cardiomyopathy, a fib, s/p AICD.    Clinical Impression   PTA patient independent. Admitted for above and presenting near baseline level for ADLs, mobility and transfers.  Provided supervision today for safety and line mgmt. Mild tremors noted in BUEs, but functional and reports feeling anxiety related. Patient with good safety awareness and educated on energy conservation techniques. Encouraged mobility with nursing. No further OT needs identified and OT will sign off.     Follow Up Recommendations  No OT follow up    Equipment Recommendations  None recommended by OT    Recommendations for Other Services       Precautions / Restrictions Precautions Precautions: None Restrictions Weight Bearing Restrictions: No      Mobility Bed Mobility               General bed mobility comments: EOB upon entry   Transfers Overall transfer level: Needs assistance Equipment used: None Transfers: Sit to/from Stand Sit to Stand: Supervision         General transfer comment: Supervision for safety. No LOB noted.     Balance Overall balance assessment: No apparent balance deficits (not formally assessed)                                         ADL either performed or assessed with clinical judgement   ADL Overall ADL's : Needs assistance/impaired     Grooming: Modified independent;Standing   Upper Body Bathing: Supervision/ safety;Sitting   Lower Body Bathing: Supervison/ safety;Sit to/from stand   Upper Body Dressing : Supervision/safety;Sitting   Lower Body Dressing: Supervision/safety;Sit to/from stand   Toilet Transfer:  Supervision/safety;Ambulation           Functional mobility during ADLs: Supervision/safety General ADL Comments: patient demosntrating ability to complete basic ADLs, transfers and mobility in room with supervision for line mgmt only      Vision   Vision Assessment?: No apparent visual deficits     Perception     Praxis      Pertinent Vitals/Pain Pain Assessment: No/denies pain     Hand Dominance Right   Extremity/Trunk Assessment Upper Extremity Assessment Upper Extremity Assessment: Overall WFL for tasks assessed(pt tremors, pt reports anxiety related)   Lower Extremity Assessment Lower Extremity Assessment: Defer to PT evaluation   Cervical / Trunk Assessment Cervical / Trunk Assessment: Normal   Communication Communication Communication: No difficulties   Cognition Arousal/Alertness: Awake/alert Behavior During Therapy: WFL for tasks assessed/performed Overall Cognitive Status: Within Functional Limits for tasks assessed                                     General Comments       Exercises     Shoulder Instructions      Home Living Family/patient expects to be discharged to:: Private residence Living Arrangements: Children Available Help at Discharge: Family;Available 24 hours/day Type of Home: House Home Access: Level entry     Home Layout: Two level Alternate Level Stairs-Number of Steps: flight Alternate Level  Stairs-Rails: Left;Right Bathroom Shower/Tub: Occupational psychologist: Standard     Home Equipment: Radio producer - single point          Prior Functioning/Environment Level of Independence: Independent        Comments: uses cane as needed, independent ADLs, IADLs, working- Nurse, adult Problem List:        OT Treatment/Interventions:      OT Goals(Current goals can be found in the care plan section) Acute Rehab OT Goals Patient Stated Goal: to get better  OT Goal Formulation: With patient   OT Frequency:     Barriers to D/C:            Co-evaluation              AM-PAC OT "6 Clicks" Daily Activity     Outcome Measure Help from another person eating meals?: None Help from another person taking care of personal grooming?: None Help from another person toileting, which includes using toliet, bedpan, or urinal?: None Help from another person bathing (including washing, rinsing, drying)?: None Help from another person to put on and taking off regular upper body clothing?: None Help from another person to put on and taking off regular lower body clothing?: None 6 Click Score: 24   End of Session Nurse Communication: Mobility status  Activity Tolerance: Patient tolerated treatment well Patient left: with call bell/phone within reach;Other (comment)(seated EOB )  OT Visit Diagnosis: Muscle weakness (generalized) (M62.81);Other abnormalities of gait and mobility (R26.89)                Time: [id number] OT Time Calculation (min): 15 min Charges:  OT General Charges $OT Visit: 1 Visit OT Evaluation $OT Eval Low Complexity: 1 Low  Jolaine Artist, OT Acute Rehabilitation Services Pager [phone] Office [phone]   Delight Stare 10/24/2019, 12:59 PM

## 2019-10-24 NOTE — Progress Notes (Signed)
PT Cancellation Note  Patient Details Name: Dean Holland Sr. MRN: [id number] DOB: 02-18-1951   Cancelled Treatment:    Reason Eval/Treat Not Completed: PT screened, no needs identified, will sign off. Patient was evaluated yesterday and deemed to have no needs. PT signed off at that time. New order placed this morning. Patient was screened, has had no change in status.  PT again signing off. Thank you for this referral.    Robbie Nangle 10/24/2019, 10:40 AM

## 2019-10-24 NOTE — Progress Notes (Signed)
PROGRESS NOTE    Dean Bones Sr.  KT:[id number] DOB: 09-27-1950 DOA: 10/23/2019 PCP: [name], MD   Brief Narrative:  HPI per Dr. [name] on 10/23/2019 Dean Bones Sr. is a 69 y.o. 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  Patient reports 2-week history of intermittent diarrhea.  Sometimes the diarrhea is watery and sometimes dark in color.  He has been taking over-the-counter medications including Pepto-Bismol and Imodium.  No abdominal pain, nausea, or vomiting.  No fevers or chills.  No recent antibiotic use.  No recent sick contacts.  No cough, shortness of breath, or chest pain.  ED Course: Afebrile.  WBC count 14.0.  Hemoglobin 11.7, at baseline.  FOBT negative.  Potassium 7.3.  EKG with PR prolongation and widened QRS complexes similar to prior tracing.  Bicarb 16, anion gap 9.  Blood glucose 121.  BUN 70, creatinine 3.8.  Baseline creatinine 1.2.  UA with negative nitrite, moderate amount of leukocytes, 0-5 WBCs, and no bacteria on microscopic examination.  Urine culture pending.  SARS-CoV-2 PCR test negative. Chest x-ray showing bibasilar atelectasis, no acute cardiopulmonary abnormality. CT abdomen pelvis showing moderate distention of the bladder without hydronephrosis. ED provider discussed the case with Dr. Royce Macadamia from nephrology who recommended medical treatment for hyperkalemia including calcium gluconate, bicarb, albuterol, insulin/D50, and Kayexalate.  In addition, patient received a 250 cc normal saline bolus.  Repeat potassium 6.7.  Review of Systems: All systems reviewed and apart from history of presenting illness, are negative.  **Interim History Patient's potassium  and his renal function is slowly improving.  Nephrology recommending discontinuation of his valsartan as well as spironolactone.  They will reinitiate his antihypertensives due to his elevated blood pressure.  His metabolic acidosis is also improving and is hemoglobin is stable.  Currently nephrology recommends holding his oral Lasix but patient is improving.  His main complaint today was that he had some mild tremors.  Feels as if his diarrhea is improving.  Assessment & Plan:   Principal Problem:   Hyperkalemia Active Problems:   DM2 (diabetes mellitus, type 2) (HCC)   Essential hypertension, benign   AKI (acute kidney injury) ([place])   Diarrhea  Severe Hyperkalemia, improving  -Suspect related to [facility] home medications including potassium supplement, spironolactone, and valsartan use.  -Potassium 7.3 on admission. EKG with PR prolongation and widened QRS complexes similar to prior tracing. -Patient received medical therapy for hyperkalemia includingcalcium gluconate, bicarb, albuterol, insulin/D50, and Kayexalate. In addition, received a 250 cc normal saline bolus.  -Potassium 6.7 on repeat labs has now trended down to 6.2 yesterday.  -Seen by nephrology.  -Recommending additional 250 cc normal saline bolus, additional sodium bicarb, and IV Lasix 60 mg once. Also was started on [place] home medications including potassium supplement, spironolactone, and valsartan -RepeatBMPand monitor very closely.May need dialysis if no improvement with medical therapy. -Potassium is now further improving is 5.3 today and he will continue to get [facility] today per nephrology -Continue monitor and trend and repeat CMP in a.m.  AKI on CKD stage III, improving  -Likely prerenal due to GI loss/dehydration in the setting of ongoing diarrhea, spironolactone, valsartan, and home Lasix use.  -BUN 70, creatinine 3.8 on admission.  -Baseline creatinine 1.2-1.3.  -CT without evidence of  hydronephrosis. -Patient's BUN/creatinine  went from 70/3.82 is now 60/3.39 -> 54/2.62 -Foley catheter placed due to distended bladder seen on CT; will try TOV in AM  -Continue to Hold home spironolactone, valsartan -Single dose of IV Lasix as above for hyperkalemia -Gentle IV fluid hydration now stopped  -Continue to monitor renal function and urine output  Bladder Distention -Check Bladder U/S -Has Foley -Try TOV in the AM   Diarrhea, improving  -Patient reports 2-week history of diarrhea. Abdominal exam benign. WBC count 14.0. CT without evidence of colitis. Reports dark stools but hemoglobin at baseline and FOBT negative. -Gentle IV fluid hydration now stopped -GI pathogen panel Pending  -C. difficile PCR Negative -Continue to monitor WBC count -SARS-CoV-2 PCR test negative   Normal Anion Gap Metabolic Acidosis -Likely related to AKI. Bicarb 16, anion gap 9. -Improving as CO2 was 24, AG was 8, and Chloride  -Bicarb supplementation with 650 mg po TID -Continue to monitor and trend -Repeat CMP in AM   Hypertension -BP was 165/44 this AM -Hydralazine as needed for SBP greater than 160 -Hold home ARB and diuretics given his renal dysfunction -Resume Home Carveilol 25 mg po BID  Ischemic cardiomyopathy Chronic Systolic CHF -Echo done July 2020 with EF 30 to 35%. No signs of volume overload at this time. -Hold home diuretics.One-time Lasix as mentioned above for hyperkalemia. -Continue to monitor for signs and symptoms of volume overload given gentle IV hydration -Resume home carvedilol 25 mg daily but will hold his ARB and spironolactone and Lasix -Strict I's and O's and Daily Weights -Continue to Monitor for S/Sx of Volume Overload   OSA on CPAP -Continue CPAP at night  Non-insulin-dependent diabetes mellitus -Check A1c. Sliding scale insulin very sensitive ACHSand CBG checks. -Last hemoglobin A1c was 5.6 -Blood sugar on last BMP was 160s and CBG was  84 -CBG range from 73-101  Paroxysmal atrial fibrillation -Currently in sinus rhythm. Takes Eliquis for anticoagulation and will resume given Negative FOBT and no S/Sx of Bleeding  -Resume Amiodarone and Carvedilol  -C/w Telemetry Montioring   Normocytic Anemia -Need to evaluate for signs and symptoms of bleeding given his dark stools -Hemoglobin/hematocrit on admission was 9.7/38.2 -> 10.5/31.0 -> 10.0/31.1 -FOBT was Negative   Generalized Weakness -Suspect related to acute illness/diarrhea. No focal motor weakness. -PT and OT evaluation recommending no Follow up   Leukocytosis -WBC went from 14.0 -> 12.4 -Continue to Monitor and Trend -Repeat CBC in AM   Thrombocytopenia -Patient's Platelet Count went from 190 -> 149 -Continue to Monitor for S/Sx of Bleeding -Resumed Eliquis today but may need to hold if further worsenes -Repeat CBC in AM   Tremor -Essential? -Continue to Monitor  -Could have been in the setting of Uremia  Morbid Obesity -Estimated body mass index is 40.71 kg/m as calculated from the following:   Height as of 10/20/19: 5\' 6"  (1.676 m).   Weight as of 10/20/19: 114.4 kg. -Weight Loss and Dietary Counseling given   DVT prophylaxis: Resume Home Eliquis today  Code Status: FULL CODE  Family Communication: No family present at bedside  Disposition Plan: Pending further clinical improvement back to his baseline with renal function improving and clearance by nephrology.  Patient's potassium has to normalize prior to discharge  Consultants:   Nephrology    Procedures:  None   Antimicrobials:  Anti-infectives (From admission, onward)   None     Subjective: Seen and examined at bedside and he states that he was feeling little bit better.  States his  diarrhea is slowing down.  His main complaint was that he is having a little bit of a tremor and it was difficult for him to hold his fork.  No nausea or vomiting.  Denies any chest pain, shortness  breath.  No other concerns or complaints at this time.  Objective: Vitals:   10/23/19 1939 10/23/19 1958 10/23/19 2308 10/24/19 0755  BP: (!) 143/48 (!) 132/51 (!) 157/57 (!) 165/44  Pulse: 81 78 72 65  Resp: 17 16 18 17   Temp: 98.5 F (36.9 C)  98.8 F (37.1 C)   TempSrc: Oral  Oral   SpO2: 100%  97% 100%    Intake/Output Summary (Last 24 hours) at 10/24/2019 1347 Last data filed at 10/24/2019 0755 Gross per 24 hour  Intake 480 ml  Output 1100 ml  Net -620 ml   There were no vitals filed for this visit.  Examination: Physical Exam:  Constitutional: WN/WD obese African-American male currently in no acute distress appears calm sitting up in the bedside Eyes: Lids and conjunctivae normal, sclerae anicteric  ENMT: External Ears, Nose appear normal. Grossly normal hearing. Mucous membranes are moist.  Neck: Appears normal, supple, no cervical masses, normal ROM, no appreciable thyromegaly; no JVD Respiratory: Mildly diminished to auscultation bilaterally, no wheezing, rales, rhonchi or crackles. Normal respiratory effort and patient is not tachypenic. No accessory muscle use. Unlabored breathing  Cardiovascular: RRR, no murmurs / rubs / gallops. S1 and S2 auscultated. Mild extremity edema.  Abdomen: Soft, non-tender, Distended. No masses palpated. No appreciable hepatosplenomegaly. Bowel sounds positive x4.  GU: Deferred. Musculoskeletal: No clubbing / cyanosis of digits/nails. No joint deformity upper and lower extremities.  Skin: No rashes, lesions, ulcers on a limited skin evaluation. No induration; Warm and dry.  Neurologic: CN 2-12 grossly intact with no focal deficits. Has a slight Bilateral tremor worse on Right arm than Left. Romberg sign and cerebellar reflexes not assessed.  Psychiatric: Normal judgment and insight. Alert and oriented x 3. Normal mood and appropriate affect.   Data Reviewed: I have personally reviewed following labs and imaging studies  CBC: Recent Labs   Lab 10/23/19 0132 10/23/19 0447 10/24/19 0210  WBC 14.0*  --  12.4*  NEUTROABS  --   --  8.6*  HGB 11.7* 10.5* 10.0*  HCT 38.0* 31.0* 31.1*  MCV 95.2  --  90.4  PLT 190  --  123456*   Basic Metabolic Panel: Recent Labs  Lab 10/23/19 0132 10/23/19 0132 10/23/19 0447 10/23/19 0447 10/23/19 0548 10/23/19 0707 10/23/19 1100 10/23/19 1529 10/24/19 0210  NA 134*  --  139  --  139 138  --   --  137  K 7.3*   < > 6.7*   < > 6.5* 6.2* 5.7* 5.9* 5.3*  CL 109  --   --   --  110 108  --   --  105  CO2 16*  --   --   --  20* 23  --   --  24  GLUCOSE 121*  --   --   --  90 106*  --   --  95  BUN 70*  --   --   --  62* 60*  --   --  54*  CREATININE 3.82*  --   --   --  3.42* 3.39*  --   --  2.62*  CALCIUM 10.4*  --   --   --  10.6* 10.3  --   --  9.8  MG  --   --   --   --   --   --   --   --  1.7  PHOS  --   --   --   --   --   --   --   --  4.3   < > = values in this interval not displayed.   GFR: Estimated Creatinine Clearance: 31.6 mL/min (A) (by C-G formula based on SCr of 2.62 mg/dL (H)). Liver Function Tests: Recent Labs  Lab 10/23/19 0132 10/24/19 0210  AST 25 25  ALT 28 29  ALKPHOS 98 94  BILITOT 0.2* 0.9  PROT 7.6 6.9  ALBUMIN 4.1 3.6   Recent Labs  Lab 10/23/19 0132  LIPASE 46   No results for input(s): AMMONIA in the last 168 hours. Coagulation Profile: No results for input(s): INR, PROTIME in the last 168 hours. Cardiac Enzymes: No results for input(s): CKTOTAL, CKMB, CKMBINDEX, TROPONINI in the last 168 hours. BNP (last 3 results) No results for input(s): PROBNP in the last 8760 hours. HbA1C: Recent Labs    10/23/19 0132  HGBA1C 5.6   CBG: Recent Labs  Lab 10/23/19 1139 10/23/19 1645 10/23/19 2034 10/24/19 0751 10/24/19 1148  GLUCAP 97 95 97 73 101*   Lipid Profile: No results for input(s): CHOL, HDL, LDLCALC, TRIG, CHOLHDL, LDLDIRECT in the last 72 hours. Thyroid Function Tests: No results for input(s): TSH, T4TOTAL, FREET4, T3FREE,  THYROIDAB in the last 72 hours. Anemia Panel: No results for input(s): VITAMINB12, FOLATE, FERRITIN, TIBC, IRON, RETICCTPCT in the last 72 hours. Sepsis Labs: No results for input(s): PROCALCITON, LATICACIDVEN in the last 168 hours.  Recent Results (from the past 240 hour(s))  Respiratory Panel by RT PCR (Flu A&B, Covid) - Nasopharyngeal Swab     Status: None   Collection Time: 10/23/19  3:07 AM   Specimen: Nasopharyngeal Swab  Result Value Ref Range Status   SARS Coronavirus 2 by RT PCR NEGATIVE NEGATIVE Final    Comment: (NOTE) SARS-CoV-2 target nucleic acids are NOT DETECTED. The SARS-CoV-2 RNA is generally detectable in upper respiratoy specimens during the acute phase of infection. The lowest concentration of SARS-CoV-2 viral copies this assay can detect is 131 copies/mL. A negative result does not preclude SARS-Cov-2 infection and should not be used as the sole basis for treatment or other patient management decisions. A negative result may occur with  improper specimen collection/handling, submission of specimen other than nasopharyngeal swab, presence of viral mutation(s) within the areas targeted by this assay, and inadequate number of viral copies (<131 copies/mL). A negative result must be combined with clinical observations, patient history, and epidemiological information. The expected result is Negative. Fact Sheet for Patients:  PinkCheek.be Fact Sheet for Healthcare Providers:  GravelBags.it This test is not yet ap proved or cleared by the [place] FDA and  has been authorized for detection and/or diagnosis of SARS-CoV-2 by FDA under an Emergency Use Authorization (EUA). This EUA will remain  in effect (meaning this test can be used) for the duration of the COVID-19 declaration under Section 564(b)(1) of the Act, 21 U.S.C. section 360bbb-3(b)(1), unless the authorization is terminated or revoked  sooner.    Influenza A by PCR NEGATIVE NEGATIVE Final   Influenza B by PCR NEGATIVE NEGATIVE Final    Comment: (NOTE) The Xpert Xpress SARS-CoV-2/FLU/RSV assay is intended as an aid in  the diagnosis of influenza from Nasopharyngeal swab specimens and  should not be used as a sole  basis for treatment. Nasal washings and  aspirates are unacceptable for Xpert Xpress SARS-CoV-2/FLU/RSV  testing. Fact Sheet for Patients: PinkCheek.be Fact Sheet for Healthcare Providers: GravelBags.it This test is not yet approved or cleared by the [place] FDA and  has been authorized for detection and/or diagnosis of SARS-CoV-2 by  FDA under an Emergency Use Authorization (EUA). This EUA will remain  in effect (meaning this test can be used) for the duration of the  Covid-19 declaration under Section 564(b)(1) of the Act, 21  U.S.C. section 360bbb-3(b)(1), unless the authorization is  terminated or revoked. Performed at [facility], [place] [address] [place], [place] [address]   Urine culture     Status: None   Collection Time: 10/23/19  4:55 AM   Specimen: Urine, Random  Result Value Ref Range Status   Specimen Description URINE, RANDOM  Final   Special Requests NONE  Final   Culture   Final    NO GROWTH Performed at [facility], [place] [address] [place], [place] [address]    Report Status 10/24/2019 FINAL  Final  C difficile quick scan w PCR reflex     Status: None   Collection Time: 10/23/19  5:58 AM   Specimen: Stool  Result Value Ref Range Status   C Diff antigen NEGATIVE NEGATIVE Final   C Diff toxin NEGATIVE NEGATIVE Final   C Diff interpretation No C. difficile detected.  Final    Comment: Performed at [facility], [place] [address] [place], [place] [address]    Radiology Studies: CT ABDOMEN PELVIS WO CONTRAST  Result Date: 10/23/2019 CLINICAL DATA:  Acute renal failure EXAM: CT ABDOMEN AND PELVIS WITHOUT  CONTRAST TECHNIQUE: Multidetector CT imaging of the abdomen and pelvis was performed following the standard protocol without IV contrast. COMPARISON:  09/19/2018 FINDINGS: Lower chest: Coronary atherosclerosis. Pacer lead into the right ventricle. No acute finding Hepatobiliary: 3 dominant liver masses which correlate with history of hemangioma by MRI. The largest is in the subcapsular right liver at 5.5 cm maximum on axial slices.No evidence of biliary obstruction or stone. Pancreas: Unremarkable. Spleen: Unremarkable. Adrenals/Urinary Tract: Negative adrenals. No hydronephrosis or stone. Physiologic distension of the bladder. Stomach/Bowel:  No obstruction. No appendicitis. Vascular/Lymphatic: No acute vascular abnormality. Extensive atherosclerotic calcification. No mass or adenopathy. Reproductive:Generalized prosthetic calcification, dystrophic and chronic. Other: No ascites or pneumoperitoneum. Musculoskeletal: No acute abnormalities. Prominent spinal degeneration and spondylosis with multi-level bridging osteophyte or enthesophyte in the thoracic spine. 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 No acute finding. IMPRESSION: 1. No acute finding. There is moderate distension of the bladder without hydronephrosis. 2. Hepatic hemangiomas. 3. Colonic diverticulosis. 4.  Aortic Atherosclerosis (ICD10-I70.0). Electronically Signed   By: Monte Fantasia M.D.   On: 10/23/2019 04:31   DG Chest Port 1 View  Result Date: 10/23/2019 CLINICAL DATA:  Shortness of breath and diarrhea for 2 weeks EXAM: PORTABLE CHEST 1 VIEW COMPARISON:  Radiograph 06/03/2019, CT 09/08/2019 FINDINGS: Streaky basilar opacities favoring atelectasis given low volumes. Stable cardiomediastinal contours with a calcified aorta. AICD battery pack overlies the left chest wall with lead towards the cardiac apex. No pneumothorax. No effusion. No acute osseous or soft tissue abnormality. Degenerative changes are present in the imaged spine and shoulders.  IMPRESSION: Basilar atelectasis, otherwise no acute cardiopulmonary abnormality. Electronically Signed   By: Lovena Le M.D.   On: 10/23/2019 02:23   Scheduled Meds: . Chlorhexidine Gluconate Cloth  6 each Topical Daily  . insulin aspart  0-5 Units Subcutaneous QHS  . insulin aspart  0-6 Units Subcutaneous TID WC  . sodium bicarbonate  650 mg Oral TID  . sodium zirconium cyclosilicate  10 g Oral TID   Continuous Infusions:   LOS: 1 day   Kerney Elbe, DO [facility] PAGER is on AMION  If 7PM-7AM, please contact night-coverage [email or web address]

## 2019-10-24 NOTE — Progress Notes (Signed)
[facility] ROUNDING NOTE   Subjective:   This is a 69 year old man with a history of chronic systolic congestive heart failure history of V. 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  Stage III chronic kidney disease hypertension paroxysmal atrial fibrillation.  Presented with diarrhea stools.  Was taking the ARB valsartan and diuretic spironolactone for resistant hypertension as well as potassium supplementation at home.  No use of nonsteroidal anti-inflammatory drugs.  Potassium elevated in the emergency room at 7.3 with a creatinine 3.8.  He had an episode of acute kidney injury on chronic kidney disease in June 2020.  It appears that his baseline serum creatinine is about 1.3 mg/dL.  Blood pressure was 165/44 pulse 67 temperature 98.3.  O2 sats 100% room air urine output 900 cc 10/23/2019  Sodium 137 potassium 5.3 chloride 105 CO2 24 BUN 54 creatinine 2.62 glucose 95 phosphorus 4.3 magnesium 1.7 calcium 9.8 AST 25 ALT 29 WBC 12.4 hemoglobin 10.0 platelets 149  CT abdomen and pelvis no acute findings hepatic angioma aortic sclerosis 10/23/2019   urine 10/23/2019 showed 6-10 RBCs 0-5 WBCs per high-power field hyaline casts present.  Serum bicarbonate 650 mg 3 times daily, Lokelma 10 g administered 9:38 AM 10/24/2019  Home medications amiodarone 100 mg daily, atorvastatin 40 mg daily carvedilol 25 mg twice daily Eliquis 5 mg daily Lasix 80 mg daily Protonix 40 mg daily potassium chloride 20 mEq K spironolactone 25 mg daily valsartan 160 mg daily  Objective:  Vital signs in last 24 hours:  Temp:  [98.5 F (36.9 C)-98.8 F (37.1 C)] 98.8 F (37.1 C) (01/28 2308) Pulse Rate:  [65-104] 65 (01/29 0755) Resp:  [16-23] 17 (01/29 0755) BP: (112-165)/(44-69) 165/44 (01/29 0755) SpO2:  [97 %-100 %] 100 % (01/29 0755)  Weight change:  There were no vitals filed for this visit.  Intake/Output: I/O last 3 completed shifts: In: 240 [P.O.:240] Out: 1100 [Urine:1100]   Intake/Output  this shift:  Total I/O In: 240 [P.O.:240] Out: -   General: Adult male in stretcher no acute distress at rest HEENT: Normocephalic atraumatic Eyes: Extraocular movements intact sclera anicteric Neck: Supple trachea midline Heart: S1-S2 appreciated no rubs Lungs: Clear to auscultation bilaterally and unlabored on room air Abdomen: Abdomen is soft with obese habitus and nontender normal bowel sounds Extremities: No edema appreciated no cyanosis or clubbing Skin: No rash on extremities exposed Neuro: Alert and oriented x3 provides a history and follows commands psych normal mood and affect     Basic Metabolic Panel: Recent Labs  Lab 10/23/19 0132 10/23/19 0132 10/23/19 0447 10/23/19 0548 10/23/19 0707 10/23/19 1100 10/23/19 1529 10/24/19 0210  NA 134*  --  139 139 138  --   --  137  K 7.3*   < > 6.7* 6.5* 6.2* 5.7* 5.9* 5.3*  CL 109  --   --  110 108  --   --  105  CO2 16*  --   --  20* 23  --   --  24  GLUCOSE 121*  --   --  90 106*  --   --  95  BUN 70*  --   --  62* 60*  --   --  54*  CREATININE 3.82*  --   --  3.42* 3.39*  --   --  2.62*  CALCIUM 10.4*   < >  --  10.6* 10.3  --   --  9.8  MG  --   --   --   --   --   --   --  1.7  PHOS  --   --   --   --   --   --   --  4.3   < > = values in this interval not displayed.    Liver Function Tests: Recent Labs  Lab 10/23/19 0132 10/24/19 0210  AST 25 25  ALT 28 29  ALKPHOS 98 94  BILITOT 0.2* 0.9  PROT 7.6 6.9  ALBUMIN 4.1 3.6   Recent Labs  Lab 10/23/19 0132  LIPASE 46   No results for input(s): AMMONIA in the last 168 hours.  CBC: Recent Labs  Lab 10/23/19 0132 10/23/19 0447 10/24/19 0210  WBC 14.0*  --  12.4*  NEUTROABS  --   --  8.6*  HGB 11.7* 10.5* 10.0*  HCT 38.0* 31.0* 31.1*  MCV 95.2  --  90.4  PLT 190  --  149*    Cardiac Enzymes: No results for input(s): CKTOTAL, CKMB, CKMBINDEX, TROPONINI in the last 168 hours.  BNP: Invalid input(s): POCBNP  CBG: Recent Labs  Lab  10/23/19 0738 10/23/19 1139 10/23/19 1645 10/23/19 2034 10/24/19 0751  GLUCAP 84 97 95 97 73    Microbiology: Results for orders placed or performed during the hospital encounter of 10/23/19  Respiratory Panel by RT PCR (Flu A&B, Covid) - Nasopharyngeal Swab     Status: None   Collection Time: 10/23/19  3:07 AM   Specimen: Nasopharyngeal Swab  Result Value Ref Range Status   SARS Coronavirus 2 by RT PCR NEGATIVE NEGATIVE Final    Comment: (NOTE) SARS-CoV-2 target nucleic acids are NOT DETECTED. The SARS-CoV-2 RNA is generally detectable in upper respiratoy specimens during the acute phase of infection. The lowest concentration of SARS-CoV-2 viral copies this assay can detect is 131 copies/mL. A negative result does not preclude SARS-Cov-2 infection and should not be used as the sole basis for treatment or other patient management decisions. A negative result may occur with  improper specimen collection/handling, submission of specimen other than nasopharyngeal swab, presence of viral mutation(s) within the areas targeted by this assay, and inadequate number of viral copies (<131 copies/mL). A negative result must be combined with clinical observations, patient history, and epidemiological information. The expected result is Negative. Fact Sheet for Patients:  PinkCheek.be Fact Sheet for Healthcare Providers:  GravelBags.it This test is not yet ap proved or cleared by the [place] FDA and  has been authorized for detection and/or diagnosis of SARS-CoV-2 by FDA under an Emergency Use Authorization (EUA). This EUA will remain  in effect (meaning this test can be used) for the duration of the COVID-19 declaration under Section 564(b)(1) of the Act, 21 U.S.C. section 360bbb-3(b)(1), unless the authorization is terminated or revoked sooner.    Influenza A by PCR NEGATIVE NEGATIVE Final   Influenza B by PCR NEGATIVE  NEGATIVE Final    Comment: (NOTE) The Xpert Xpress SARS-CoV-2/FLU/RSV assay is intended as an aid in  the diagnosis of influenza from Nasopharyngeal swab specimens and  should not be used as a sole basis for treatment. Nasal washings and  aspirates are unacceptable for Xpert Xpress SARS-CoV-2/FLU/RSV  testing. Fact Sheet for Patients: PinkCheek.be Fact Sheet for Healthcare Providers: GravelBags.it This test is not yet approved or cleared by the [place] FDA and  has been authorized for detection and/or diagnosis of SARS-CoV-2 by  FDA under an Emergency Use Authorization (EUA). This EUA will remain  in effect (meaning this test can be used) for the duration of the  Covid-19 declaration  under Section 564(b)(1) of the Act, 21  U.S.C. section 360bbb-3(b)(1), unless the authorization is  terminated or revoked. Performed at [facility], [place] [address], [place], [place] [address]   C difficile quick scan w PCR reflex     Status: None   Collection Time: 10/23/19  5:58 AM   Specimen: Stool  Result Value Ref Range Status   C Diff antigen NEGATIVE NEGATIVE Final   C Diff toxin NEGATIVE NEGATIVE Final   C Diff interpretation No C. difficile detected.  Final    Comment: Performed at [facility], [place] [address], [place], [place] [address]    Coagulation Studies: No results for input(s): LABPROT, INR in the last 72 hours.  Urinalysis: Recent Labs    10/23/19 0155  COLORURINE YELLOW  LABSPEC 1.010  PHURINE 5.0  GLUCOSEU NEGATIVE  HGBUR NEGATIVE  BILIRUBINUR NEGATIVE  KETONESUR NEGATIVE  PROTEINUR NEGATIVE  NITRITE NEGATIVE  LEUKOCYTESUR MODERATE*      Imaging: CT ABDOMEN PELVIS WO CONTRAST  Result Date: 10/23/2019 CLINICAL DATA:  Acute renal failure EXAM: CT ABDOMEN AND PELVIS WITHOUT CONTRAST TECHNIQUE: Multidetector CT imaging of the abdomen and pelvis was performed following the standard protocol  without IV contrast. COMPARISON:  09/19/2018 FINDINGS: Lower chest: Coronary atherosclerosis. Pacer lead into the right ventricle. No acute finding Hepatobiliary: 3 dominant liver masses which correlate with history of hemangioma by MRI. The largest is in the subcapsular right liver at 5.5 cm maximum on axial slices.No evidence of biliary obstruction or stone. Pancreas: Unremarkable. Spleen: Unremarkable. Adrenals/Urinary Tract: Negative adrenals. No hydronephrosis or stone. Physiologic distension of the bladder. Stomach/Bowel:  No obstruction. No appendicitis. Vascular/Lymphatic: No acute vascular abnormality. Extensive atherosclerotic calcification. No mass or adenopathy. Reproductive:Generalized prosthetic calcification, dystrophic and chronic. Other: No ascites or pneumoperitoneum. Musculoskeletal: No acute abnormalities. Prominent spinal degeneration and spondylosis with multi-level bridging osteophyte or enthesophyte in the thoracic spine. 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 No acute finding. IMPRESSION: 1. No acute finding. There is moderate distension of the bladder without hydronephrosis. 2. Hepatic hemangiomas. 3. Colonic diverticulosis. 4.  Aortic Atherosclerosis (ICD10-I70.0). Electronically Signed   By: Monte Fantasia M.D.   On: 10/23/2019 04:31   DG Chest Port 1 View  Result Date: 10/23/2019 CLINICAL DATA:  Shortness of breath and diarrhea for 2 weeks EXAM: PORTABLE CHEST 1 VIEW COMPARISON:  Radiograph 06/03/2019, CT 09/08/2019 FINDINGS: Streaky basilar opacities favoring atelectasis given low volumes. Stable cardiomediastinal contours with a calcified aorta. AICD battery pack overlies the left chest wall with lead towards the cardiac apex. No pneumothorax. No effusion. No acute osseous or soft tissue abnormality. Degenerative changes are present in the imaged spine and shoulders. IMPRESSION: Basilar atelectasis, otherwise no acute cardiopulmonary abnormality. Electronically Signed   By: Lovena Le M.D.   On: 10/23/2019 02:23     Medications:    . Chlorhexidine Gluconate Cloth  6 each Topical Daily  . insulin aspart  0-5 Units Subcutaneous QHS  . insulin aspart  0-6 Units Subcutaneous TID WC  . sodium bicarbonate  650 mg Oral TID  . sodium zirconium cyclosilicate  10 g Oral TID   acetaminophen **OR** acetaminophen, hydrALAZINE  Assessment/ Plan:   Acute kidney injury with baseline serum creatinine 1.3 in the setting of GI losses, spironolactone, valsartan.  UA did show 6-10 RBCs CT scan unremarkable continues to hold antihypertensives.  Creatinine seems to be improving with decreasing potassium  Hyperkalemia she seems to be improving continue Lokelma 10 g given 9 AM.  We will continue to follow potassium this should improve with improvement of renal function.  Anemia stable at this point does not appear to be an issue  Bones appear stable continue to follow renal panel calcium phosphorus.  Metabolic acidosis has improved with sodium bicarbonate oral.  Continue to follow  Diabetes mellitus insulin sliding scale per primary service  Chronic heart failure with continue to hold spironolactone and valsartan due to risk of hyperkalemia.  Hypertension this appears to be an issue will initiate antihypertensive medications recommend restarting home medications but discontinue valsartan please as well spironolactone  Diarrhea continue to follow with internal medicine appreciate assistance.  We will continue to hold on oral Lasix  History of atrial fibrillation would recommend reinitiation of amiodarone carvedilol Eliquis.   LOS: [place] @TODAY @10 :19 AM

## 2019-10-25 LAB — COMPREHENSIVE METABOLIC PANEL
ALT: 28 U/L (ref 0–44)
AST: 25 U/L (ref 15–41)
Albumin: 3.6 g/dL (ref 3.5–5.0)
Alkaline Phosphatase: 89 U/L (ref 38–126)
Anion gap: 12 (ref 5–15)
BUN: 45 mg/dL — ABNORMAL HIGH (ref 8–23)
CO2: 25 mmol/L (ref 22–32)
Calcium: 9.9 mg/dL (ref 8.9–10.3)
Chloride: 97 mmol/L — ABNORMAL LOW (ref 98–111)
Creatinine, Ser: 2.29 mg/dL — ABNORMAL HIGH (ref 0.61–1.24)
GFR calc Af Amer: 33 mL/min — ABNORMAL LOW (ref >60)
GFR calc non Af Amer: 28 mL/min — ABNORMAL LOW (ref >60)
Glucose, Bld: 131 mg/dL — ABNORMAL HIGH (ref 70–99)
Potassium: 4.4 mmol/L (ref 3.5–5.1)
Sodium: 134 mmol/L — ABNORMAL LOW (ref 135–145)
Total Bilirubin: 0.8 mg/dL (ref 0.3–1.2)
Total Protein: 6.9 g/dL (ref 6.5–8.1)

## 2019-10-25 LAB — CBC WITH DIFFERENTIAL/PLATELET
Abs Immature Granulocytes: 0.07 10*3/uL (ref 0.00–0.07)
Basophils Absolute: 0 10*3/uL (ref 0.0–0.1)
Basophils Relative: 0 %
Eosinophils Absolute: 0.3 10*3/uL (ref 0.0–0.5)
Eosinophils Relative: 3 %
HCT: 31.1 % — ABNORMAL LOW (ref 39.0–52.0)
Hemoglobin: 10 g/dL — ABNORMAL LOW (ref 13.0–17.0)
Immature Granulocytes: 1 %
Lymphocytes Relative: 18 %
Lymphs Abs: 1.9 10*3/uL (ref 0.7–4.0)
MCH: 29.2 pg (ref 26.0–34.0)
MCHC: 32.2 g/dL (ref 30.0–36.0)
MCV: 90.9 fL (ref 80.0–100.0)
Monocytes Absolute: 0.4 10*3/uL (ref 0.1–1.0)
Monocytes Relative: 4 %
Neutro Abs: 8.2 10*3/uL — ABNORMAL HIGH (ref 1.7–7.7)
Neutrophils Relative %: 74 %
Platelets: 160 10*3/uL (ref 150–400)
RBC: 3.42 MIL/uL — ABNORMAL LOW (ref 4.22–5.81)
RDW: 13.3 % (ref 11.5–15.5)
WBC: 10.9 10*3/uL — ABNORMAL HIGH (ref 4.0–10.5)
nRBC: 0 % (ref 0.0–0.2)

## 2019-10-25 LAB — PHOSPHORUS: Phosphorus: 3.2 mg/dL (ref 2.5–4.6)

## 2019-10-25 LAB — GLUCOSE, CAPILLARY
Glucose-Capillary: 106 mg/dL — ABNORMAL HIGH (ref 70–99)
Glucose-Capillary: 91 mg/dL (ref 70–99)
Glucose-Capillary: 85 mg/dL (ref 70–99)
Glucose-Capillary: 103 mg/dL — ABNORMAL HIGH (ref 70–99)

## 2019-10-25 LAB — MAGNESIUM: Magnesium: 1.7 mg/dL (ref 1.7–2.4)

## 2019-10-25 MED ORDER — HYDROXYZINE HCL 25 MG PO TABS: 25.0000 mg | ORAL_TABLET | Freq: Three times a day (TID) | ORAL | Status: DC | PRN

## 2019-10-25 MED ORDER — POLYETHYLENE GLYCOL 3350 17 G PO PACK
17.0000 g | PACK | Freq: Two times a day (BID) | ORAL | Status: DC
  Administered 2019-10-25 – 2019-10-26 (×3): 17 g via ORAL
  Filled 2019-10-25 (×3): qty 1

## 2019-10-25 MED ORDER — SENNOSIDES-DOCUSATE SODIUM 8.6-50 MG PO TABS
1.0000 | ORAL_TABLET | Freq: Two times a day (BID) | ORAL | Status: DC
  Administered 2019-10-25 – 2019-10-26 (×3): 1 via ORAL
  Filled 2019-10-25 (×3): qty 1

## 2019-10-25 NOTE — Plan of Care (Signed)
Patient has reported difficulty dealing with his anxiety. I spent about 20 minutes talking with him this morning and we discussed ways to reduce his anxiety through prayer and social interaction. I also discussed his disease process and today's lab results with him and he was reassured that they are improving.

## 2019-10-25 NOTE — Discharge Summary (Signed)
Physician Discharge Summary  Dean Bones Sr. KT:[id number] DOB: 08/20/1951 DOA: 10/23/2019  PCP: [name], MD  Admit date: 10/23/2019 Discharge date: 10/26/2019  Admitted From: Home Disposition: Home  Recommendations for Outpatient Follow-up:  1. Follow up with PCP in 1-2 weeks 2. Follow up with Nephrology in 1-2 weeks 3. Follow up with Cardiology within 1-2 weeks 4. Continue to Hold ACE/ARB, Spironolactone, and NSAIDs; Defer to Cardiology/Nephrology to reinitiate Lasix 5. Continue with Lokelma for Hyperkalemia until stopped by Nephrology  6. Please obtain CMP/CBC, Mag, Phos in one week 7. Please follow up on the following pending results:  Home Health: No Equipment/Devices: None   Discharge Condition: Stable CODE STATUS: FULL CODE Diet recommendation: Heart Healthy Diet   Brief/Interim Summary: HPI per Dr. 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 Sometimes the diarrhea is watery and sometimes dark in color. He has been taking over-the-counter medications including Pepto-Bismol and Imodium. No abdominal pain, nausea, or vomiting. No fevers or chills. No recent antibiotic use. No recent sick contacts. No cough, shortness of breath, or chest pain.  ED Course:Afebrile. WBC count 14.0. Hemoglobin 11.7, at baseline. FOBT negative. Potassium 7.3. EKG with PR prolongation and widened QRS complexes similar to prior tracing. Bicarb 16, anion gap 9. Blood glucose 121. BUN 70, creatinine 3.8. Baseline creatinine 1.2. UA with negative nitrite, moderate amount of  leukocytes, 0-5 WBCs, and no bacteria on microscopic examination. Urine culture pending. SARS-CoV-2 PCR test negative. Chest x-ray showing bibasilar atelectasis, no acute cardiopulmonary abnormality. CT abdomen pelvis showingmoderate distention of the bladder without hydronephrosis. ED provider discussed the case with Dr. Royce Macadamia from nephrology who recommended medical treatment for hyperkalemia including calcium gluconate, bicarb, albuterol, insulin/D50, and Kayexalate. In addition, patient received a 250 cc normal saline bolus. Repeat potassium 6.7.  Review of Systems: All systems reviewed and apart from history of presenting illness, are negative.  **Interim History Patient's potassium and his renal function is slowly improving slightly still elevated.  Nephrology recommending discontinuation of his valsartan as well as spironolactone.  They will reinitiate his antihypertensives due to his elevated blood pressure.  His metabolic acidosis is also improving and is hemoglobin is stable.  Currently nephrology recommends holding his oral Lasix but patient is improving and will defer to them reinitiate outpatient setting.    During the hospitalization had some mild tremors but is attributing it to anxiety as he is extremely anxious.  Feels as if his diarrhea is improving.  He was cleared by nephrology for discharge and will need to follow-up with PCP, cardiology as well as nephrology in outpatient setting and continue to avoid arms, ACS, as well as spironolactone and nonsteroidals given his hyperkalemia.  Discharge Diagnoses:  Principal Problem:   Hyperkalemia Active Problems:   DM2 (diabetes mellitus, type 2) (HCC)   Essential hypertension, benign   AKI (acute kidney injury) ([place])   Diarrhea  SevereHyperkalemia, improved  -Suspect related to [facility] home medications including potassium supplement, spironolactone, and valsartan use.  -Potassium 7.3 on admission. EKG with PR prolongation and  widened QRS complexes similar to prior tracing. -Patient received medical therapy for hyperkalemia includingcalcium gluconate, bicarb, albuterol, insulin/D50, and Kayexalate. In addition, received a 250 cc normal saline bolus.  -Seen  by Nephrology.  -Recommending additional 250 cc normal saline bolus, additional sodium bicarb, and IV Lasix 60 mg once. Also was started on [place] home medications including potassium supplement, spironolactone, and valsartan and have stopped these for discharge -Potassium was 5.2 today and nephrology recommending discharging home on 10 mg of oral Lokelma and have written him for 10 days and will need to follow-up with nephrology for further look, doses -Continue monitor and trend and repeat within 1 week  AKI on CKD stage III, improving  -Likely prerenal due to GI loss/dehydration in the setting of ongoing diarrhea, spironolactone, valsartan, and home Lasix use.  -BUN 70, creatinine 3.8 on admission.  -Baseline creatinine 1.2-1.3.  -CT without evidence of hydronephrosis. -Patient's BUN/creatinine went from 70/3.82 -> 60/3.39 -> 54/2.62 -> 45/2.29 -> 41/2.10 and is improving but is still not at baseline yet as his baseline creatinine is 1.2-1.3 -Foley catheter placed due to distended bladder seen on CT; will try TOV in AM  -Continue to Hold home spironolactone, valsartan as above -Single dose of IV Lasix as above for hyperkalemia on admission -Gentle IV fluid hydration now stopped  -Continue to monitor renal function and urine output and appreciate further recommendations from nephrology  -Discharge home that he is cleared by nephrology and they are currently recommending continuing to avoid ACE inhibitor, ARBs, spironolactone as well as NSAIDs and recommending continue to hold oral Lasix for now and discharging on 10 mg of oral Lokelma  Bladder Distention -Check Bladder U/S -Has Foley and will discontinue it and check PVR -Will try TOV this AM and  his PVR was 35 and he had improved and had no bladder discomfort  Diarrhea, improved -Patient reports 2-week history of diarrhea. Abdominal exam benign. WBC count 14.0 and now improved to 10.9. CT without evidence of colitis. Reports dark stools but hemoglobin at baseline and FOBT negative. -Gentle IV fluid hydration now stopped -GI pathogen panel Pending still but diarrhea has resolved -C. difficile PCR Negative -Continue to monitor WBC count -SARS-CoV-2 PCR test negative   Normal Anion Gap Metabolic Acidosis, improved  -Likely related to AKI. Bicarb 16, anion gap 9. -Improved as CO2 was 25, AG was 12, chloride was 101 -Bicarb supplementation with 650 mg po TID -Continue to monitor and trend -Repeat CMP within 1 week  Hypertension -BP now 139/58 -Hydralazine as needed for SBP greater than 160 -Hold home ARB and diureticsgiven his renal dysfunction have discontinued -Resume Home Carveilol 25 mg po BID -Follow-up with PCP for further blood pressure management  Ischemic cardiomyopathy Chronic Systolic CHF -Echo done July 2020 with EF 30 to 35%. No signs of volume overload at this time. -Hold home diuretics.One-time Lasix as mentioned above for hyperkalemia. -Continue to monitor for signs and symptoms of volume overload given gentle IV hydration -Resume home carvedilol 25 mg daily but will hold his ARB and spironolactone and Lasix -Strict I's and O's and Daily Weights -Continue to Monitor for S/Sx of Volume Overload  -We will need follow-up cardiology evaluation at discharge and recommend following up within 1 to 2 weeks  OSA on CPAP -Continue CPAP at night  Non-insulin-dependent diabetes mellitus -Check A1c. Sliding scale insulin very sensitive ACHSand CBG checks. -Last hemoglobin A1c was 5.6 -Blood sugar on last BMP was 160s and CBG was 84 -CBG range from 92-103  Paroxysmal atrial fibrillation -Currently in sinus rhythm. Takes Eliquis for anticoagulation  and will resume given Negative FOBT and no S/Sx of Bleeding  -Resumed Amiodarone and Carvedilol  -C/w  Telemetry Montioring while hospitalized  Normocytic Anemia -Need to evaluate for signs and symptoms of bleeding given his dark stools -Hemoglobin/hematocrit on admission was 9.7/38.2 -> 10.5/31.0 -> 10.0/31.1 -> 10.0/31.1 -> 10.4/32.6 -FOBT was Negative   Generalized Weakness -Suspect related to acute illness/diarrhea. No focal motor weakness. -PT and OT evaluation recommending no Follow up   Leukocytosis -WBC went from 14.0 -> 12.4 -> 10.9 -> 11.3 was likely reactive -Continue to Monitor and Trend -Repeat CBC within 1 week  Thrombocytopenia -Patient's Platelet Count went from 190 -> 149 -> 160 -> 161 -Continue to Monitor for S/Sx of Bleeding -Resumed Eliquis today but may need to hold if further worsenes -Repeat CBC in AM   Tremor -Essential?;  Patient states this is resolved today and attributed this to being very anxious -Continue to Monitor  -Could have been in the setting of Uremia   Hyponatremia  -Patient's Na+ was 134 and improved to 138 -Continue to Monitor and Trend -Repeat CMP in AM   Morbid Obesity -Estimated body mass index is 40.71 kg/m as calculated from the following:   Height as of 10/20/19: 5\' 6"  (1.676 m).   Weight as of 10/20/19: 114.4 kg. -Weight Loss and Dietary Counseling given   Anxiety -Patient is significantly anxious about Covid and being hospitalized -Given reassurance and started hydroxyzine 25 mg p.o. 3 times daily as needed for anxiety  Discharge Instructions   Allergies as of 10/26/2019      Reactions   Contrast Media [iodinated Diagnostic Agents] Shortness Of Breath, Nausea And Vomiting   Lisinopril Cough      Medication List    STOP taking these medications   potassium chloride SA 20 MEQ tablet Commonly known as: KLOR-CON   spironolactone 25 MG tablet Commonly known as: ALDACTONE   valsartan 160 MG tablet Commonly  known as: DIOVAN     TAKE these medications   allopurinol 300 MG tablet Commonly known as: ZYLOPRIM Take 300 mg by mouth daily.   amiodarone 100 MG tablet Commonly known as: PACERONE Take 1 tablet (100 mg total) by mouth daily.   atorvastatin 40 MG tablet Commonly known as: LIPITOR Take 1 tablet (40 mg total) by mouth daily at 6 PM.   carvedilol 25 MG tablet Commonly known as: COREG Take 1 tablet (25 mg total) by mouth 2 (two) times daily with a meal.   [employer] Vitamin D3 25 MCG (1000 UT) capsule Generic drug: Cholecalciferol Take 1,000 Units by mouth daily.   Eliquis 5 MG Tabs tablet Generic drug: apixaban TAKE 1 TABLET BY MOUTH TWICE A DAY   furosemide 80 MG tablet Commonly known as: LASIX Take 1 tablet (80 mg total) by mouth daily. RESUME When ok with Nephrology Start taking on: November 02, 2019 What changed:   additional instructions  These instructions start on November 02, 2019. If you are unsure what to do until then, ask your doctor or other care provider.   hydrOXYzine 25 MG tablet Commonly known as: ATARAX/VISTARIL Take 1 tablet (25 mg total) by mouth 3 (three) times daily as needed for anxiety.   magnesium oxide 400 MG tablet Commonly known as: MAG-OX Take 200 mg by mouth 2 (two) times daily.   pantoprazole 40 MG tablet Commonly known as: PROTONIX Take 1 tablet (40 mg total) by mouth daily.   sodium bicarbonate 650 MG tablet Take 1 tablet (650 mg total) by mouth 3 (three) times daily.   sodium zirconium cyclosilicate 10 g Pack packet Commonly known as: LOKELMA Take  10 g by mouth daily. Have Labs Repeated (K+) within a Week and Have Nephrology Review to see if needs to be continued   ZINC-VITAMIN C PO Take 1 tablet by mouth daily.       Allergies  Allergen Reactions  . Contrast Media [Iodinated Diagnostic Agents] Shortness Of Breath and Nausea And Vomiting  . Lisinopril Cough    Consultations:  Nephrology  Procedures/Studies: CT ABDOMEN  PELVIS WO CONTRAST  Result Date: 10/23/2019 CLINICAL DATA:  Acute renal failure EXAM: CT ABDOMEN AND PELVIS WITHOUT CONTRAST TECHNIQUE: Multidetector CT imaging of the abdomen and pelvis was performed following the standard protocol without IV contrast. COMPARISON:  09/19/2018 FINDINGS: Lower chest: Coronary atherosclerosis. Pacer lead into the right ventricle. No acute finding Hepatobiliary: 3 dominant liver masses which correlate with history of hemangioma by MRI. The largest is in the subcapsular right liver at 5.5 cm maximum on axial slices.No evidence of biliary obstruction or stone. Pancreas: Unremarkable. Spleen: Unremarkable. Adrenals/Urinary Tract: Negative adrenals. No hydronephrosis or stone. Physiologic distension of the bladder. Stomach/Bowel:  No obstruction. No appendicitis. Vascular/Lymphatic: No acute vascular abnormality. Extensive atherosclerotic calcification. No mass or adenopathy. Reproductive:Generalized prosthetic calcification, dystrophic and chronic. Other: No ascites or pneumoperitoneum. Musculoskeletal: No acute abnormalities. Prominent spinal degeneration and spondylosis with multi-level bridging osteophyte or enthesophyte in the thoracic spine. 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 No acute finding. IMPRESSION: 1. No acute finding. There is moderate distension of the bladder without hydronephrosis. 2. Hepatic hemangiomas. 3. Colonic diverticulosis. 4.  Aortic Atherosclerosis (ICD10-I70.0). Electronically Signed   By: Monte Fantasia M.D.   On: 10/23/2019 04:31   DG Chest Port 1 View  Result Date: 10/23/2019 CLINICAL DATA:  Shortness of breath and diarrhea for 2 weeks EXAM: PORTABLE CHEST 1 VIEW COMPARISON:  Radiograph 06/03/2019, CT 09/08/2019 FINDINGS: Streaky basilar opacities favoring atelectasis given low volumes. Stable cardiomediastinal contours with a calcified aorta. AICD battery pack overlies the left chest wall with lead towards the cardiac apex. No pneumothorax. No  effusion. No acute osseous or soft tissue abnormality. Degenerative changes are present in the imaged spine and shoulders. IMPRESSION: Basilar atelectasis, otherwise no acute cardiopulmonary abnormality. Electronically Signed   By: Lovena Le M.D.   On: 10/23/2019 02:23   CUP PACEART REMOTE DEVICE CHECK  Result Date: 10/02/2019 Scheduled remote reviewed.  Normal device function.  Next remote 91 days. Kathy Breach, RN, CCDS, [employer]   Subjective: Examined at bedside and is feeling well.  His diarrhea improved.  No chest pain, lightheadedness or dizziness.  No other concerns or complaints at this time.  Discharge Exam: Vitals:   10/26/19 0340 10/26/19 0712  BP: (!) 141/47 (!) 149/56  Pulse: (!) 57 (!) 58  Resp: (!) 21 19  Temp: 98 F (36.7 C) 98.1 F (36.7 C)  SpO2: 99% 96%   Vitals:   10/25/19 1952 10/26/19 0011 10/26/19 0340 10/26/19 0712  BP: (!) 134/54 (!) 135/52 (!) 141/47 (!) 149/56  Pulse: (!) 51  (!) 57 (!) 58  Resp: 14  (!) 21 19  Temp: 98 F (36.7 C) 97.9 F (36.6 C) 98 F (36.7 C) 98.1 F (36.7 C)  TempSrc: Oral Oral Oral Oral  SpO2: 100%  99% 96%   General: Pt is alert, awake, not in acute distress Cardiovascular: RRR, S1/S2 +, no rubs, no gallops Respiratory: Slightly diminished bilaterally, no wheezing, no rhonchi Abdominal: Soft, NT, distended secondary to body habitus, bowel sounds + Extremities: Minimal edema, no cyanosis  The results of significant  diagnostics from this hospitalization (including imaging, microbiology, ancillary and laboratory) are listed below for reference.    Microbiology: Recent Results (from the past 240 hour(s))  Respiratory Panel by RT PCR (Flu A&B, Covid) - Nasopharyngeal Swab     Status: None   Collection Time: 10/23/19  3:07 AM   Specimen: Nasopharyngeal Swab  Result Value Ref Range Status   SARS Coronavirus 2 by RT PCR NEGATIVE NEGATIVE Final    Comment: (NOTE) SARS-CoV-2 target nucleic acids are NOT  DETECTED. The SARS-CoV-2 RNA is generally detectable in upper respiratoy specimens during the acute phase of infection. The lowest concentration of SARS-CoV-2 viral copies this assay can detect is 131 copies/mL. A negative result does not preclude SARS-Cov-2 infection and should not be used as the sole basis for treatment or other patient management decisions. A negative result may occur with  improper specimen collection/handling, submission of specimen other than nasopharyngeal swab, presence of viral mutation(s) within the areas targeted by this assay, and inadequate number of viral copies (<131 copies/mL). A negative result must be combined with clinical observations, patient history, and epidemiological information. The expected result is Negative. Fact Sheet for Patients:  PinkCheek.be Fact Sheet for Healthcare Providers:  GravelBags.it This test is not yet ap proved or cleared by the [place] FDA and  has been authorized for detection and/or diagnosis of SARS-CoV-2 by FDA under an Emergency Use Authorization (EUA). This EUA will remain  in effect (meaning this test can be used) for the duration of the COVID-19 declaration under Section 564(b)(1) of the Act, 21 U.S.C. section 360bbb-3(b)(1), unless the authorization is terminated or revoked sooner.    Influenza A by PCR NEGATIVE NEGATIVE Final   Influenza B by PCR NEGATIVE NEGATIVE Final    Comment: (NOTE) The Xpert Xpress SARS-CoV-2/FLU/RSV assay is intended as an aid in  the diagnosis of influenza from Nasopharyngeal swab specimens and  should not be used as a sole basis for treatment. Nasal washings and  aspirates are unacceptable for Xpert Xpress SARS-CoV-2/FLU/RSV  testing. Fact Sheet for Patients: PinkCheek.be Fact Sheet for Healthcare Providers: GravelBags.it This test is not yet approved or cleared  by the [place] FDA and  has been authorized for detection and/or diagnosis of SARS-CoV-2 by  FDA under an Emergency Use Authorization (EUA). This EUA will remain  in effect (meaning this test can be used) for the duration of the  Covid-19 declaration under Section 564(b)(1) of the Act, 21  U.S.C. section 360bbb-3(b)(1), unless the authorization is  terminated or revoked. Performed at [facility], [place] [address] [place], [place] [address]   Urine culture     Status: None   Collection Time: 10/23/19  4:55 AM   Specimen: Urine, Random  Result Value Ref Range Status   Specimen Description URINE, RANDOM  Final   Special Requests NONE  Final   Culture   Final    NO GROWTH Performed at [facility], [place] [address], [place], [place] [address]    Report Status 10/24/2019 FINAL  Final  C difficile quick scan w PCR reflex     Status: None   Collection Time: 10/23/19  5:58 AM   Specimen: Stool  Result Value Ref Range Status   C Diff antigen NEGATIVE NEGATIVE Final   C Diff toxin NEGATIVE NEGATIVE Final   C Diff interpretation No C. difficile detected.  Final    Comment: Performed at [facility], [place] [address], [place], [place] [address]  Labs: BNP (last 3 results) Recent Labs    04/06/19 1659 05/05/19 1920 06/04/19 0020  BNP 910.1* 329.5* 123XX123*   Basic Metabolic Panel: Recent Labs  Lab 10/23/19 0548 10/23/19 0548 10/23/19 0707 10/23/19 0707 10/23/19 1100 10/23/19 1529 10/24/19 0210 10/25/19 0517 10/26/19 0411  NA 139  --  138  --   --   --  137 134* 138  K 6.5*   < > 6.2*   < > 5.7* 5.9* 5.3* 4.4 5.2*  CL 110  --  108  --   --   --  105 97* 101  CO2 20*  --  23  --   --   --  24 25 25   GLUCOSE 90  --  106*  --   --   --  95 131* 100*  BUN 62*  --  60*  --   --   --  54* 45* 41*  CREATININE 3.42*  --  3.39*  --   --   --  2.62* 2.29* 2.10*  CALCIUM 10.6*  --  10.3  --   --   --  9.8 9.9 10.1  MG  --   --   --   --   --   --  1.7 1.7 2.0   PHOS  --   --   --   --   --   --  4.3 3.2 2.8   < > = values in this interval not displayed.   Liver Function Tests: Recent Labs  Lab 10/23/19 0132 10/24/19 0210 10/25/19 0517 10/26/19 0411  AST 25 25 25 22   ALT 28 29 28 27   ALKPHOS 98 94 89 94  BILITOT 0.2* 0.9 0.8 0.4  PROT 7.6 6.9 6.9 7.3  ALBUMIN 4.1 3.6 3.6 3.8   Recent Labs  Lab 10/23/19 0132  LIPASE 46   No results for input(s): AMMONIA in the last 168 hours. CBC: Recent Labs  Lab 10/23/19 0132 10/23/19 0447 10/24/19 0210 10/25/19 0517 10/26/19 0411  WBC 14.0*  --  12.4* 10.9* 11.3*  NEUTROABS  --   --  8.6* 8.2* 7.7  HGB 11.7* 10.5* 10.0* 10.0* 10.4*  HCT 38.0* 31.0* 31.1* 31.1* 32.6*  MCV 95.2  --  90.4 90.9 92.1  PLT 190  --  149* 160 161   Cardiac Enzymes: No results for input(s): CKTOTAL, CKMB, CKMBINDEX, TROPONINI in the last 168 hours. BNP: Invalid input(s): POCBNP CBG: Recent Labs  Lab 10/25/19 0732 10/25/19 1120 10/25/19 1623 10/25/19 1950 10/26/19 0654  GLUCAP 106* 91 85 103* 102*   D-Dimer No results for input(s): DDIMER in the last 72 hours. Hgb A1c No results for input(s): HGBA1C in the last 72 hours. Lipid Profile No results for input(s): CHOL, HDL, LDLCALC, TRIG, CHOLHDL, LDLDIRECT in the last 72 hours. Thyroid function studies No results for input(s): TSH, T4TOTAL, T3FREE, THYROIDAB in the last 72 hours.  Invalid input(s): FREET3 Anemia work up No results for input(s): VITAMINB12, FOLATE, FERRITIN, TIBC, IRON, RETICCTPCT in the last 72 hours. Urinalysis    Component Value Date/Time   COLORURINE YELLOW 10/23/2019 0155   APPEARANCEUR CLEAR 10/23/2019 0155   LABSPEC 1.010 10/23/2019 0155   PHURINE 5.0 10/23/2019 0155   GLUCOSEU NEGATIVE 10/23/2019 0155   HGBUR NEGATIVE 10/23/2019 0155   BILIRUBINUR NEGATIVE 10/23/2019 0155   KETONESUR NEGATIVE 10/23/2019 0155   PROTEINUR NEGATIVE 10/23/2019 0155   NITRITE NEGATIVE 10/23/2019 0155   LEUKOCYTESUR MODERATE (A) 10/23/2019  0155   Sepsis  Labs Invalid input(s): PROCALCITONIN,  WBC,  LACTICIDVEN Microbiology Recent Results (from the past 240 hour(s))  Respiratory Panel by RT PCR (Flu A&B, Covid) - Nasopharyngeal Swab     Status: None   Collection Time: 10/23/19  3:07 AM   Specimen: Nasopharyngeal Swab  Result Value Ref Range Status   SARS Coronavirus 2 by RT PCR NEGATIVE NEGATIVE Final    Comment: (NOTE) SARS-CoV-2 target nucleic acids are NOT DETECTED. The SARS-CoV-2 RNA is generally detectable in upper respiratoy specimens during the acute phase of infection. The lowest concentration of SARS-CoV-2 viral copies this assay can detect is 131 copies/mL. A negative result does not preclude SARS-Cov-2 infection and should not be used as the sole basis for treatment or other patient management decisions. A negative result may occur with  improper specimen collection/handling, submission of specimen other than nasopharyngeal swab, presence of viral mutation(s) within the areas targeted by this assay, and inadequate number of viral copies (<131 copies/mL). A negative result must be combined with clinical observations, patient history, and epidemiological information. The expected result is Negative. Fact Sheet for Patients:  PinkCheek.be Fact Sheet for Healthcare Providers:  GravelBags.it This test is not yet ap proved or cleared by the [place] FDA and  has been authorized for detection and/or diagnosis of SARS-CoV-2 by FDA under an Emergency Use Authorization (EUA). This EUA will remain  in effect (meaning this test can be used) for the duration of the COVID-19 declaration under Section 564(b)(1) of the Act, 21 U.S.C. section 360bbb-3(b)(1), unless the authorization is terminated or revoked sooner.    Influenza A by PCR NEGATIVE NEGATIVE Final   Influenza B by PCR NEGATIVE NEGATIVE Final    Comment: (NOTE) The Xpert Xpress SARS-CoV-2/FLU/RSV  assay is intended as an aid in  the diagnosis of influenza from Nasopharyngeal swab specimens and  should not be used as a sole basis for treatment. Nasal washings and  aspirates are unacceptable for Xpert Xpress SARS-CoV-2/FLU/RSV  testing. Fact Sheet for Patients: PinkCheek.be Fact Sheet for Healthcare Providers: GravelBags.it This test is not yet approved or cleared by the [place] FDA and  has been authorized for detection and/or diagnosis of SARS-CoV-2 by  FDA under an Emergency Use Authorization (EUA). This EUA will remain  in effect (meaning this test can be used) for the duration of the  Covid-19 declaration under Section 564(b)(1) of the Act, 21  U.S.C. section 360bbb-3(b)(1), unless the authorization is  terminated or revoked. Performed at [facility], [place] [address], [place], [place] [address]   Urine culture     Status: None   Collection Time: 10/23/19  4:55 AM   Specimen: Urine, Random  Result Value Ref Range Status   Specimen Description URINE, RANDOM  Final   Special Requests NONE  Final   Culture   Final    NO GROWTH Performed at [facility], [place] [address] [place], [place] [address]    Report Status 10/24/2019 FINAL  Final  C difficile quick scan w PCR reflex     Status: None   Collection Time: 10/23/19  5:58 AM   Specimen: Stool  Result Value Ref Range Status   C Diff antigen NEGATIVE NEGATIVE Final   C Diff toxin NEGATIVE NEGATIVE Final   C Diff interpretation No C. difficile detected.  Final    Comment: Performed at [facility], [place] [address] [place], [place] [address]   Time coordinating discharge: 35 minutes  SIGNED:  Georgina Quint  Lise Auer, DO [facility] 10/26/2019, 8:07 AM Pager is on [place]  If 7PM-7AM, please contact night-coverage [email or web address] Password [email or web address]

## 2019-10-25 NOTE — Progress Notes (Signed)
RT offered pt CPAP for the night and pt declined at this time stating he was not interested. RT expressed to pt that if he changed his mind to please call. Pt respiratory status is stable at this time. RT will continue to monitor.

## 2019-10-25 NOTE — Progress Notes (Signed)
Pt refused CPAP

## 2019-10-25 NOTE — Progress Notes (Signed)
PROGRESS NOTE    Dean Bones Sr.  KT:[id number] DOB: 01-13-1951 DOA: 10/23/2019 PCP: [name], MD   Brief Narrative:  HPI per Dr. [name] on 10/23/2019 Dean Bones Sr. is a 69 y.o. 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  Patient reports 2-week history of intermittent diarrhea.  Sometimes the diarrhea is watery and sometimes dark in color.  He has been taking over-the-counter medications including Pepto-Bismol and Imodium.  No abdominal pain, nausea, or vomiting.  No fevers or chills.  No recent antibiotic use.  No recent sick contacts.  No cough, shortness of breath, or chest pain.  ED Course: Afebrile.  WBC count 14.0.  Hemoglobin 11.7, at baseline.  FOBT negative.  Potassium 7.3.  EKG with PR prolongation and widened QRS complexes similar to prior tracing.  Bicarb 16, anion gap 9.  Blood glucose 121.  BUN 70, creatinine 3.8.  Baseline creatinine 1.2.  UA with negative nitrite, moderate amount of leukocytes, 0-5 WBCs, and no bacteria on microscopic examination.  Urine culture pending.  SARS-CoV-2 PCR test negative. Chest x-ray showing bibasilar atelectasis, no acute cardiopulmonary abnormality. CT abdomen pelvis showing moderate distention of the bladder without hydronephrosis. ED provider discussed the case with Dr. Royce Macadamia from nephrology who recommended medical treatment for hyperkalemia including calcium gluconate, bicarb, albuterol, insulin/D50, and Kayexalate.  In addition, patient received a 250 cc normal saline bolus.  Repeat potassium 6.7.  Review of Systems: All systems reviewed and apart from history of presenting illness, are negative.  **Interim History Patient's potassium  and his renal function is slowly improving.  Nephrology recommending discontinuation of his valsartan as well as spironolactone.  They will reinitiate his antihypertensives due to his elevated blood pressure.  His metabolic acidosis is also improving and is hemoglobin is stable.  Currently nephrology recommends holding his oral Lasix but patient is improving.  His main yesterday was that he had some mild tremors but is attributing it to anxiety as he is extremely anxious.  Feels as if his diarrhea is improving.  Assessment & Plan:   Principal Problem:   Hyperkalemia Active Problems:   DM2 (diabetes mellitus, type 2) (HCC)   Essential hypertension, benign   AKI (acute kidney injury) ([place])   Diarrhea  Severe Hyperkalemia, improved  -Suspect related to [facility] home medications including potassium supplement, spironolactone, and valsartan use.  -Potassium 7.3 on admission. EKG with PR prolongation and widened QRS complexes similar to prior tracing. -Patient received medical therapy for hyperkalemia includingcalcium gluconate, bicarb, albuterol, insulin/D50, and Kayexalate. In addition, received a 250 cc normal saline bolus.  -Seen by Nephrology.  -Recommending additional 250 cc normal saline bolus, additional sodium bicarb, and IV Lasix 60 mg once. Also was started on [place] home medications including potassium supplement, spironolactone, and valsartan -RepeatBMPand monitor very closely.May need dialysis if no improvement with medical therapy. -Potassium is now further improving is 4.4 today -Continue monitor and trend and repeat CMP in a.m.  AKI on CKD stage III, improving  -Likely prerenal due to GI loss/dehydration in the setting of ongoing diarrhea, spironolactone, valsartan, and home Lasix use.  -BUN 70, creatinine 3.8 on admission.  -Baseline creatinine 1.2-1.3.  -CT without evidence of hydronephrosis. -Patient's BUN/creatinine went from 70/3.82 -> 60/3.39 -> 54/2.62 ->  45/2.29 and is improving but  is still not at baseline yet as his baseline creatinine is 1.2-1.3 -Foley catheter placed due to distended bladder seen on CT; will try TOV in AM  -Continue to Hold home spironolactone, valsartan -Single dose of IV Lasix as above for hyperkalemia on admission -Gentle IV fluid hydration now stopped  -Continue to monitor renal function and urine output and appreciate further recommendations from nephrology  -Discharge home when cleared by nephrology and they are currently recommending continuing to avoid ACE inhibitor, ARBs, spironolactone as well as NSAIDs and recommending continue to hold oral Lasix for now  Bladder Distention -Check Bladder U/S -Has Foley and will discontinue it and check PVR -Will try TOV this AM   Diarrhea, improved -Patient reports 2-week history of diarrhea. Abdominal exam benign. WBC count 14.0 and now improved to 10.9. CT without evidence of colitis. Reports dark stools but hemoglobin at baseline and FOBT negative. -Gentle IV fluid hydration now stopped -GI pathogen panel Pending still but diarrhea has resolved -C. difficile PCR Negative -Continue to monitor WBC count -SARS-CoV-2 PCR test negative   Normal Anion Gap Metabolic Acidosis, improved  -Likely related to AKI. Bicarb 16, anion gap 9. -Improved as CO2 was 25, AG was 12, and Chloride is 97 today  -Bicarb supplementation with 650 mg po TID -Continue to monitor and trend -Repeat CMP in AM   Hypertension -BP was 165/44 yesterday AM; improved as BP is now 136/43 -Hydralazine as needed for SBP greater than 160 -Hold home ARB and diuretics given his renal dysfunction -Resume Home Carveilol 25 mg po BID  Ischemic cardiomyopathy Chronic Systolic CHF -Echo done July 2020 with EF 30 to 35%. No signs of volume overload at this time. -Hold home diuretics.One-time Lasix as mentioned above for hyperkalemia. -Continue to monitor for signs and symptoms of volume overload  given gentle IV hydration -Resume home carvedilol 25 mg daily but will hold his ARB and spironolactone and Lasix -Strict I's and O's and Daily Weights -Continue to Monitor for S/Sx of Volume Overload  -We will need follow-up cardiology evaluation at discharge  OSA on CPAP -Continue CPAP at night  Non-insulin-dependent diabetes mellitus -Check A1c. Sliding scale insulin very sensitive ACHSand CBG checks. -Last hemoglobin A1c was 5.6 -Blood sugar on last BMP was 160s and CBG was 84 -CBG range from 91-1 06  Paroxysmal atrial fibrillation -Currently in sinus rhythm. Takes Eliquis for anticoagulation and will resume given Negative FOBT and no S/Sx of Bleeding  -Resumed Amiodarone and Carvedilol  -C/w Telemetry Montioring   Normocytic Anemia -Need to evaluate for signs and symptoms of bleeding given his dark stools -Hemoglobin/hematocrit on admission was 9.7/38.2 -> 10.5/31.0 -> 10.0/31.1 -> 10.0/31.1 -FOBT was Negative   Generalized Weakness -Suspect related to acute illness/diarrhea. No focal motor weakness. -PT and OT evaluation recommending no Follow up   Leukocytosis -WBC went from 14.0 -> 12.4 -> 10.9 -Continue to Monitor and Trend -Repeat CBC in AM   Thrombocytopenia -Patient's Platelet Count went from 190 -> 149 -> 160  -Continue to Monitor for S/Sx of Bleeding -Resumed Eliquis today but may need to hold if further worsenes -Repeat CBC in AM   Tremor -Essential?;  Patient states this is resolved today and attributed this to being very anxious -Continue to Monitor  -Could have been in the setting of Uremia   Hyponatremia  -Patient's Na+ was 134 -Continue to Monitor and Trend -Repeat CMP in AM   Morbid Obesity -Estimated body mass index is 40.71 kg/m as calculated from the following:  Height as of 10/20/19: 5\' 6"  (1.676 m).   Weight as of 10/20/19: 114.4 kg. -Weight Loss and Dietary Counseling given   Anxiety -Patient is significantly anxious about  Covid and being hospitalized -Given reassurance and started hydroxyzine 25 mg p.o. 3 times daily as needed for anxiety  DVT prophylaxis: Resume Home Eliquis yesterday Code Status: FULL CODE  Family Communication: No family present at bedside  Disposition Plan: Pending further clinical improvement back to his baseline with renal function improving and clearance by nephrology.  Patient's potassium has to normalize prior to discharge  Consultants:   Nephrology    Procedures:  None   Antimicrobials:  Anti-infectives (From admission, onward)   None     Subjective: Seen and examined at bedside and states his diarrhea has improved but he is feeling extremely anxious.  No nausea or vomiting.  Happy that his renal function numbers are coming down.  States he has not been sleeping well for at least 3 weeks with a lot on his mind.  No other concerns or complaints at this time.  Objective: Vitals:   10/23/19 2308 10/24/19 0755 10/24/19 2223 10/25/19 0733  BP: (!) 157/57 (!) 165/44  (!) 136/43  Pulse: 72 65  60  Resp: 18 17  13   Temp: 98.8 F (37.1 C)  97.9 F (36.6 C) 98.4 F (36.9 C)  TempSrc: Oral  Oral Oral  SpO2: 97% 100%  95%    Intake/Output Summary (Last 24 hours) at 10/25/2019 0815 Last data filed at 10/25/2019 0450 Gross per 24 hour  Intake 1440 ml  Output 1800 ml  Net -360 ml   There were no vitals filed for this visit.  Examination: Physical Exam:  Constitutional: WN/WD morbidly obese African-American male currently appears anxious but is in NAD  Eyes: Lids and conjunctivae normal, sclerae anicteric  ENMT: External Ears, Nose appear normal. Grossly normal hearing. Mucous membranes are moist.  Neck: Appears normal, supple, no cervical masses, normal ROM, no appreciable thyromegaly; no JVD Respiratory: Slightly diminished to auscultation bilaterally, no wheezing, rales, rhonchi or crackles. Normal respiratory effort and patient is not tachypenic. No accessory muscle  use.  Unlabored breathing Cardiovascular: RRR, no murmurs / rubs / gallops. S1 and S2 auscultated.  Trace extremity edema.  Abdomen: Soft, non-tender; Distended 2/2 to body habitus. Bowel sounds positive x4.  GU: Deferred. Musculoskeletal: No clubbing / cyanosis of digits/nails. No joint deformity upper and lower extremities.   Skin: No rashes, lesions, ulcers on limited skin evaluation. No induration; Warm and dry.  Neurologic: CN 2-12 grossly intact with no focal deficits.  Tremors have improved. Romberg sign and cerebellar reflexes not assessed.  Psychiatric: Normal judgment and insight. Alert and oriented x 3.  Extremely anxious mood and appropriate affect.   Data Reviewed: I have personally reviewed following labs and imaging studies  CBC: Recent Labs  Lab 10/23/19 0132 10/23/19 0447 10/24/19 0210 10/25/19 0517  WBC 14.0*  --  12.4* 10.9*  NEUTROABS  --   --  8.6* 8.2*  HGB 11.7* 10.5* 10.0* 10.0*  HCT 38.0* 31.0* 31.1* 31.1*  MCV 95.2  --  90.4 90.9  PLT 190  --  149* 0000000   Basic Metabolic Panel: Recent Labs  Lab 10/23/19 0132 10/23/19 0132 10/23/19 0447 10/23/19 0447 10/23/19 0548 10/23/19 0548 10/23/19 0707 10/23/19 1100 10/23/19 1529 10/24/19 0210 10/25/19 0517  NA 134*   < > 139  --  139  --  138  --   --  137 134*  K 7.3*   < > 6.7*   < > 6.5*   < > 6.2* 5.7* 5.9* 5.3* 4.4  CL 109  --   --   --  110  --  108  --   --  105 97*  CO2 16*  --   --   --  20*  --  23  --   --  24 25  GLUCOSE 121*  --   --   --  90  --  106*  --   --  95 131*  BUN 70*  --   --   --  62*  --  60*  --   --  54* 45*  CREATININE 3.82*  --   --   --  3.42*  --  3.39*  --   --  2.62* 2.29*  CALCIUM 10.4*  --   --   --  10.6*  --  10.3  --   --  9.8 9.9  MG  --   --   --   --   --   --   --   --   --  1.7 1.7  PHOS  --   --   --   --   --   --   --   --   --  4.3 3.2   < > = values in this interval not displayed.   GFR: Estimated Creatinine Clearance: 36.2 mL/min (A) (by C-G formula  based on SCr of 2.29 mg/dL (H)). Liver Function Tests: Recent Labs  Lab 10/23/19 0132 10/24/19 0210 10/25/19 0517  AST 25 25 25   ALT 28 29 28   ALKPHOS 98 94 89  BILITOT 0.2* 0.9 0.8  PROT 7.6 6.9 6.9  ALBUMIN 4.1 3.6 3.6   Recent Labs  Lab 10/23/19 0132  LIPASE 46   No results for input(s): AMMONIA in the last 168 hours. Coagulation Profile: No results for input(s): INR, PROTIME in the last 168 hours. Cardiac Enzymes: No results for input(s): CKTOTAL, CKMB, CKMBINDEX, TROPONINI in the last 168 hours. BNP (last 3 results) No results for input(s): PROBNP in the last 8760 hours. HbA1C: Recent Labs    10/23/19 0132  HGBA1C 5.6   CBG: Recent Labs  Lab 10/24/19 0751 10/24/19 1148 10/24/19 1645 10/24/19 2104 10/25/19 0732  GLUCAP 73 101* 91 93 106*   Lipid Profile: No results for input(s): CHOL, HDL, LDLCALC, TRIG, CHOLHDL, LDLDIRECT in the last 72 hours. Thyroid Function Tests: No results for input(s): TSH, T4TOTAL, FREET4, T3FREE, THYROIDAB in the last 72 hours. Anemia Panel: No results for input(s): VITAMINB12, FOLATE, FERRITIN, TIBC, IRON, RETICCTPCT in the last 72 hours. Sepsis Labs: No results for input(s): PROCALCITON, LATICACIDVEN in the last 168 hours.  Recent Results (from the past 240 hour(s))  Respiratory Panel by RT PCR (Flu A&B, Covid) - Nasopharyngeal Swab     Status: None   Collection Time: 10/23/19  3:07 AM   Specimen: Nasopharyngeal Swab  Result Value Ref Range Status   SARS Coronavirus 2 by RT PCR NEGATIVE NEGATIVE Final    Comment: (NOTE) SARS-CoV-2 target nucleic acids are NOT DETECTED. The SARS-CoV-2 RNA is generally detectable in upper respiratoy specimens during the acute phase of infection. The lowest concentration of SARS-CoV-2 viral copies this assay can detect is 131 copies/mL. A negative result does not preclude SARS-Cov-2 infection and should not be used as the sole basis for treatment or other patient management decisions. A  negative result may  occur with  improper specimen collection/handling, submission of specimen other than nasopharyngeal swab, presence of viral mutation(s) within the areas targeted by this assay, and inadequate number of viral copies (<131 copies/mL). A negative result must be combined with clinical observations, patient history, and epidemiological information. The expected result is Negative. Fact Sheet for Patients:  PinkCheek.be Fact Sheet for Healthcare Providers:  GravelBags.it This test is not yet ap proved or cleared by the [place] FDA and  has been authorized for detection and/or diagnosis of SARS-CoV-2 by FDA under an Emergency Use Authorization (EUA). This EUA will remain  in effect (meaning this test can be used) for the duration of the COVID-19 declaration under Section 564(b)(1) of the Act, 21 U.S.C. section 360bbb-3(b)(1), unless the authorization is terminated or revoked sooner.    Influenza A by PCR NEGATIVE NEGATIVE Final   Influenza B by PCR NEGATIVE NEGATIVE Final    Comment: (NOTE) The Xpert Xpress SARS-CoV-2/FLU/RSV assay is intended as an aid in  the diagnosis of influenza from Nasopharyngeal swab specimens and  should not be used as a sole basis for treatment. Nasal washings and  aspirates are unacceptable for Xpert Xpress SARS-CoV-2/FLU/RSV  testing. Fact Sheet for Patients: PinkCheek.be Fact Sheet for Healthcare Providers: GravelBags.it This test is not yet approved or cleared by the [place] FDA and  has been authorized for detection and/or diagnosis of SARS-CoV-2 by  FDA under an Emergency Use Authorization (EUA). This EUA will remain  in effect (meaning this test can be used) for the duration of the  Covid-19 declaration under Section 564(b)(1) of the Act, 21  U.S.C. section 360bbb-3(b)(1), unless the authorization is   terminated or revoked. Performed at [facility], [place] [address] [place], [place] [address]   Urine culture     Status: None   Collection Time: 10/23/19  4:55 AM   Specimen: Urine, Random  Result Value Ref Range Status   Specimen Description URINE, RANDOM  Final   Special Requests NONE  Final   Culture   Final    NO GROWTH Performed at [facility], [place] [address] [place], [place] [address]    Report Status 10/24/2019 FINAL  Final  C difficile quick scan w PCR reflex     Status: None   Collection Time: 10/23/19  5:58 AM   Specimen: Stool  Result Value Ref Range Status   C Diff antigen NEGATIVE NEGATIVE Final   C Diff toxin NEGATIVE NEGATIVE Final   C Diff interpretation No C. difficile detected.  Final    Comment: Performed at [facility], [place] [address], [place], [place] [address]    Radiology Studies: No results found. Scheduled Meds: . amiodarone  100 mg Oral Daily  . apixaban  5 mg Oral BID  . atorvastatin  40 mg Oral q1800  . carvedilol  25 mg Oral BID WC  . Chlorhexidine Gluconate Cloth  6 each Topical Daily  . insulin aspart  0-5 Units Subcutaneous QHS  . insulin aspart  0-6 Units Subcutaneous TID WC  . magnesium oxide  400 mg Oral BID  . pantoprazole  40 mg Oral Daily  . sodium bicarbonate  650 mg Oral TID   Continuous Infusions:   LOS: 2 days   Kerney Elbe, DO [facility] PAGER is on [place]  If 7PM-7AM, please contact night-coverage [email or web address]

## 2019-10-25 NOTE — Discharge Instructions (Signed)

## 2019-10-25 NOTE — Progress Notes (Signed)
[facility] ROUNDING NOTE   Subjective:   This is a 68 year old man with a history of chronic systolic congestive heart failure history of V. 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  Stage III chronic kidney disease hypertension paroxysmal atrial fibrillation.  Presented with diarrhea stools.  Was taking the ARB valsartan and diuretic spironolactone for resistant hypertension as well as potassium supplementation at home.  No use of nonsteroidal anti-inflammatory drugs.  Potassium elevated in the emergency room at 7.3 with a creatinine 3.8.  He had an episode of acute kidney injury on chronic kidney disease in June 2020.  It appears that his baseline serum creatinine is about 1.3 mg/dL.  Blood pressure 136/43 pulse 60 temperature 98.4 O2 sats 100%.  Urine output 1.6 L 10/24/2019  CT abdomen and pelvis no acute findings hepatic angioma aortic sclerosis 10/23/2019   urine 10/23/2019 showed 6-10 RBCs 0-5 WBCs per high-power field hyaline casts present.  Sodium 134 potassium 4.4 chloride 97 CO2 25 BUN 45 creatinine 2.29 glucose 131 calcium 9.9 phosphorus 3.2 magnesium 1.7 calcium 9.9 AST 25 ALT 28 albumin 3.6  , Lokelma 10 g administered 9:38 AM 10/24/2019  Amiodarone 100 mg daily apixaban 5 mg twice daily atorvastatin 40 mg daily to a low 25 mg twice daily, insulin sliding scale, magnesium 400 mg daily Protonix 40 mg daily sodium bicarbonate 650 mg 3 times daily Objective:  Vital signs in last 24 hours:  Temp:  [97.9 F (36.6 C)-98.4 F (36.9 C)] 98.4 F (36.9 C) (01/30 0733) Pulse Rate:  [60] 60 (01/30 0733) Resp:  [13] 13 (01/30 0733) BP: (136)/(43) 136/43 (01/30 0733) SpO2:  [95 %] 95 % (01/30 0733)  Weight change:  There were no vitals filed for this visit.  Intake/Output: I/O last 3 completed shifts: In: 1920 [P.O.:1920] Out: 2400 [Urine:2400]   Intake/Output this shift:  No intake/output data recorded.  General: Adult male in stretcher no acute distress at  rest HEENT: Normocephalic atraumatic Eyes: Extraocular movements intact sclera anicteric Neck: Supple trachea midline Heart: S1-S2 appreciated no rubs Lungs: Clear to auscultation bilaterally and unlabored on room air Abdomen: Abdomen is soft with obese habitus and nontender normal bowel sounds Extremities: No edema appreciated no cyanosis or clubbing Skin: No rash on extremities exposed Neuro: Alert and oriented x3 provides a history and follows commands psych normal mood and affect     Basic Metabolic Panel: Recent Labs  Lab 10/23/19 0132 10/23/19 0132 10/23/19 0447 10/23/19 0548 10/23/19 0548 10/23/19 0707 10/23/19 1100 10/23/19 1529 10/24/19 0210 10/25/19 0517  NA 134*   < > 139 139  --  138  --   --  137 134*  K 7.3*   < > 6.7* 6.5*   < > 6.2* 5.7* 5.9* 5.3* 4.4  CL 109  --   --  110  --  108  --   --  105 97*  CO2 16*  --   --  20*  --  23  --   --  24 25  GLUCOSE 121*  --   --  90  --  106*  --   --  95 131*  BUN 70*  --   --  62*  --  60*  --   --  54* 45*  CREATININE 3.82*  --   --  3.42*  --  3.39*  --   --  2.62* 2.29*  CALCIUM 10.4*   < >  --  10.6*   < >  10.3  --   --  9.8 9.9  MG  --   --   --   --   --   --   --   --  1.7 1.7  PHOS  --   --   --   --   --   --   --   --  4.3 3.2   < > = values in this interval not displayed.    Liver Function Tests: Recent Labs  Lab 10/23/19 0132 10/24/19 0210 10/25/19 0517  AST 25 25 25   ALT 28 29 28   ALKPHOS 98 94 89  BILITOT 0.2* 0.9 0.8  PROT 7.6 6.9 6.9  ALBUMIN 4.1 3.6 3.6   Recent Labs  Lab 10/23/19 0132  LIPASE 46   No results for input(s): AMMONIA in the last 168 hours.  CBC: Recent Labs  Lab 10/23/19 0132 10/23/19 0447 10/24/19 0210 10/25/19 0517  WBC 14.0*  --  12.4* 10.9*  NEUTROABS  --   --  8.6* 8.2*  HGB 11.7* 10.5* 10.0* 10.0*  HCT 38.0* 31.0* 31.1* 31.1*  MCV 95.2  --  90.4 90.9  PLT 190  --  149* 160    Cardiac Enzymes: No results for input(s): CKTOTAL, CKMB, CKMBINDEX,  TROPONINI in the last 168 hours.  BNP: Invalid input(s): POCBNP  CBG: Recent Labs  Lab 10/24/19 0751 10/24/19 1148 10/24/19 1645 10/24/19 2104 10/25/19 0732  GLUCAP 73 101* 91 93 106*    Microbiology: Results for orders placed or performed during the hospital encounter of 10/23/19  Respiratory Panel by RT PCR (Flu A&B, Covid) - Nasopharyngeal Swab     Status: None   Collection Time: 10/23/19  3:07 AM   Specimen: Nasopharyngeal Swab  Result Value Ref Range Status   SARS Coronavirus 2 by RT PCR NEGATIVE NEGATIVE Final    Comment: (NOTE) SARS-CoV-2 target nucleic acids are NOT DETECTED. The SARS-CoV-2 RNA is generally detectable in upper respiratoy specimens during the acute phase of infection. The lowest concentration of SARS-CoV-2 viral copies this assay can detect is 131 copies/mL. A negative result does not preclude SARS-Cov-2 infection and should not be used as the sole basis for treatment or other patient management decisions. A negative result may occur with  improper specimen collection/handling, submission of specimen other than nasopharyngeal swab, presence of viral mutation(s) within the areas targeted by this assay, and inadequate number of viral copies (<131 copies/mL). A negative result must be combined with clinical observations, patient history, and epidemiological information. The expected result is Negative. Fact Sheet for Patients:  PinkCheek.be Fact Sheet for Healthcare Providers:  GravelBags.it This test is not yet ap proved or cleared by the [place] FDA and  has been authorized for detection and/or diagnosis of SARS-CoV-2 by FDA under an Emergency Use Authorization (EUA). This EUA will remain  in effect (meaning this test can be used) for the duration of the COVID-19 declaration under Section 564(b)(1) of the Act, 21 U.S.C. section 360bbb-3(b)(1), unless the authorization is terminated  or revoked sooner.    Influenza A by PCR NEGATIVE NEGATIVE Final   Influenza B by PCR NEGATIVE NEGATIVE Final    Comment: (NOTE) The Xpert Xpress SARS-CoV-2/FLU/RSV assay is intended as an aid in  the diagnosis of influenza from Nasopharyngeal swab specimens and  should not be used as a sole basis for treatment. Nasal washings and  aspirates are unacceptable for Xpert Xpress SARS-CoV-2/FLU/RSV  testing. Fact Sheet for Patients: PinkCheek.be Fact Sheet for Healthcare  Providers: GravelBags.it This test is not yet approved or cleared by the [place] and  has been authorized for detection and/or diagnosis of SARS-CoV-2 by  FDA under an Emergency Use Authorization (EUA). This EUA will remain  in effect (meaning this test can be used) for the duration of the  Covid-19 declaration under Section 564(b)(1) of the Act, 21  U.S.C. section 360bbb-3(b)(1), unless the authorization is  terminated or revoked. Performed at [facility], [place] [address], [place], [place] [address]   Urine culture     Status: None   Collection Time: 10/23/19  4:55 AM   Specimen: Urine, Random  Result Value Ref Range Status   Specimen Description URINE, RANDOM  Final   Special Requests NONE  Final   Culture   Final    NO GROWTH Performed at [facility], [place] [address] [place], [place] [address]    Report Status 10/24/2019 FINAL  Final  C difficile quick scan w PCR reflex     Status: None   Collection Time: 10/23/19  5:58 AM   Specimen: Stool  Result Value Ref Range Status   C Diff antigen NEGATIVE NEGATIVE Final   C Diff toxin NEGATIVE NEGATIVE Final   C Diff interpretation No C. difficile detected.  Final    Comment: Performed at [facility], [place] [address], [place],  [address]    Coagulation Studies: No results for input(s): LABPROT, INR in the last 72 hours.  Urinalysis: Recent Labs    10/23/19 0155   COLORURINE YELLOW  LABSPEC 1.010  PHURINE 5.0  GLUCOSEU NEGATIVE  HGBUR NEGATIVE  BILIRUBINUR NEGATIVE  KETONESUR NEGATIVE  PROTEINUR NEGATIVE  NITRITE NEGATIVE  LEUKOCYTESUR MODERATE*      Imaging: No results found.   Medications:    . amiodarone  100 mg Oral Daily  . apixaban  5 mg Oral BID  . atorvastatin  40 mg Oral q1800  . carvedilol  25 mg Oral BID WC  . Chlorhexidine Gluconate Cloth  6 each Topical Daily  . insulin aspart  0-5 Units Subcutaneous QHS  . insulin aspart  0-6 Units Subcutaneous TID WC  . magnesium oxide  400 mg Oral BID  . pantoprazole  40 mg Oral Daily  . sodium bicarbonate  650 mg Oral TID   acetaminophen **OR** acetaminophen, hydrALAZINE  Assessment/ Plan:   Acute kidney injury with baseline serum creatinine 1.3 in the setting of GI losses, spironolactone, valsartan.  UA did show 6-10 RBCs CT scan unremarkable continues to hold antihypertensives.  Creatinine seems to be improving with decreasing potassium.  Continue to avoid ACE inhibitors, ARB's, spironolactone.  No contrast no NSAIDs.  Hyperkalemia she seems to be improving continue Lokelma 10 g given 9 AM.  Renal function has improved as has potassium.  Anemia stable at this point does not appear to be an issue  Bones appear stable continue to follow renal panel calcium phosphorus.  Metabolic acidosis has improved with sodium bicarbonate oral.  Continue to follow  Diabetes mellitus insulin sliding scale per primary service  Chronic heart failure with continue to hold spironolactone and valsartan due to risk of hyperkalemia.  Hypertension this appears to be an issue will initiate antihypertensive medications recommend restarting home medications but discontinue valsartan please as well spironolactone  Diarrhea continue to follow with internal medicine appreciate assistance.  We will continue to hold on oral Lasix  History of atrial fibrillation would recommend reinitiation of amiodarone  carvedilol Eliquis.  LOS: Anton Ruiz @TODAY @10 :06 AM

## 2019-10-26 LAB — CBC WITH DIFFERENTIAL/PLATELET
Abs Immature Granulocytes: 0.07 10*3/uL (ref 0.00–0.07)
Basophils Absolute: 0.1 10*3/uL (ref 0.0–0.1)
Basophils Relative: 0 %
Eosinophils Absolute: 0.3 10*3/uL (ref 0.0–0.5)
Eosinophils Relative: 3 %
HCT: 32.6 % — ABNORMAL LOW (ref 39.0–52.0)
Hemoglobin: 10.4 g/dL — ABNORMAL LOW (ref 13.0–17.0)
Immature Granulocytes: 1 %
Lymphocytes Relative: 20 %
Lymphs Abs: 2.2 10*3/uL (ref 0.7–4.0)
MCH: 29.4 pg (ref 26.0–34.0)
MCHC: 31.9 g/dL (ref 30.0–36.0)
MCV: 92.1 fL (ref 80.0–100.0)
Monocytes Absolute: 0.9 10*3/uL (ref 0.1–1.0)
Monocytes Relative: 8 %
Neutro Abs: 7.7 10*3/uL (ref 1.7–7.7)
Neutrophils Relative %: 68 %
Platelets: 161 10*3/uL (ref 150–400)
RBC: 3.54 MIL/uL — ABNORMAL LOW (ref 4.22–5.81)
RDW: 13.2 % (ref 11.5–15.5)
WBC: 11.3 10*3/uL — ABNORMAL HIGH (ref 4.0–10.5)
nRBC: 0 % (ref 0.0–0.2)

## 2019-10-26 LAB — COMPREHENSIVE METABOLIC PANEL
ALT: 27 U/L (ref 0–44)
AST: 22 U/L (ref 15–41)
Albumin: 3.8 g/dL (ref 3.5–5.0)
Alkaline Phosphatase: 94 U/L (ref 38–126)
Anion gap: 12 (ref 5–15)
BUN: 41 mg/dL — ABNORMAL HIGH (ref 8–23)
CO2: 25 mmol/L (ref 22–32)
Calcium: 10.1 mg/dL (ref 8.9–10.3)
Chloride: 101 mmol/L (ref 98–111)
Creatinine, Ser: 2.1 mg/dL — ABNORMAL HIGH (ref 0.61–1.24)
GFR calc Af Amer: 36 mL/min — ABNORMAL LOW (ref >60)
GFR calc non Af Amer: 31 mL/min — ABNORMAL LOW (ref >60)
Glucose, Bld: 100 mg/dL — ABNORMAL HIGH (ref 70–99)
Potassium: 5.2 mmol/L — ABNORMAL HIGH (ref 3.5–5.1)
Sodium: 138 mmol/L (ref 135–145)
Total Bilirubin: 0.4 mg/dL (ref 0.3–1.2)
Total Protein: 7.3 g/dL (ref 6.5–8.1)

## 2019-10-26 LAB — PHOSPHORUS: Phosphorus: 2.8 mg/dL (ref 2.5–4.6)

## 2019-10-26 LAB — GLUCOSE, CAPILLARY
Glucose-Capillary: 102 mg/dL — ABNORMAL HIGH (ref 70–99)
Glucose-Capillary: 92 mg/dL (ref 70–99)

## 2019-10-26 LAB — MAGNESIUM: Magnesium: 2 mg/dL (ref 1.7–2.4)

## 2019-10-26 MED ORDER — HYDROXYZINE HCL 25 MG PO TABS: 25.0000 mg | ORAL_TABLET | Freq: Three times a day (TID) | ORAL | 0 refills | Status: DC | PRN

## 2019-10-26 MED ORDER — FUROSEMIDE 80 MG PO TABS: 80.0000 mg | ORAL_TABLET | Freq: Every day | ORAL | 3 refills | Status: DC

## 2019-10-26 MED ORDER — SODIUM ZIRCONIUM CYCLOSILICATE 10 G PO PACK
10.0000 g | PACK | Freq: Every day | ORAL | Status: DC
  Administered 2019-10-26: 10 g via ORAL
  Filled 2019-10-26: qty 1

## 2019-10-26 MED ORDER — SODIUM BICARBONATE 650 MG PO TABS: 650.0000 mg | ORAL_TABLET | Freq: Three times a day (TID) | ORAL | 0 refills | Status: DC

## 2019-10-26 MED ORDER — SODIUM ZIRCONIUM CYCLOSILICATE 10 G PO PACK: 10.0000 g | PACK | Freq: Every day | ORAL | 0 refills | Status: DC

## 2019-10-26 NOTE — Progress Notes (Signed)
[facility] ROUNDING [facility]   Subjective:   This is a 69 year old man with a history of chronic systolic congestive heart failure history of V. 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  Stage III chronic kidney disease hypertension paroxysmal atrial fibrillation.  Presented with diarrhea stools.  Was taking the ARB valsartan and diuretic spironolactone for resistant hypertension as well as potassium supplementation at home.  No use of nonsteroidal anti-inflammatory drugs.  Potassium elevated in the emergency room at 7.3 with a creatinine 3.8.  He had an episode of acute kidney injury on chronic kidney disease in June 2020.  It appears that his baseline serum creatinine is about 1.3 mg/dL.  Blood pressure 149/56 pulse 60 temperature 98.1 O2 sats 96% room air urine output 1.6 L 10/24/2019 1.5 L 10/25/2019  Sodium 138 potassium 5.2 chloride 101 CO2 25 BUN 41 creatinine 2.1 glucose 100 calcium 10.1 phosphorus 2.8 magnesium 2.0 albumin 3.8 hemoglobin 10.4  CT abdomen and pelvis no acute findings hepatic angioma aortic sclerosis 10/23/2019   urine 10/23/2019 showed 6-10 RBCs 0-5 WBCs per high-power field hyaline casts present.    Lokelma 10 g administered 9:38 AM 10/24/2019  Amiodarone 100 mg daily apixaban 5 mg twice daily atorvastatin 40 mg daily to a low 25 mg twice daily, insulin sliding scale, magnesium 400 mg daily Protonix 40 mg daily sodium bicarbonate 650 mg 3 times daily   Objective:  Vital signs in last 24 hours:  Temp:  [97.9 F (36.6 C)-99.7 F (37.6 C)] 98.1 F (36.7 C) (01/31 0712) Pulse Rate:  [51-62] 58 (01/31 0712) Resp:  [11-23] 19 (01/31 0712) BP: (134-151)/(47-59) 149/56 (01/31 0712) SpO2:  [94 %-100 %] 96 % (01/31 0712)  Weight change:  There were no vitals filed for this visit.  Intake/Output: I/O last 3 completed shifts: In: 480 [P.O.:480] Out: 1550 [Urine:1550]   Intake/Output this shift:  No intake/output data recorded.  General: Adult male in  stretcher no acute distress at rest HEENT: Normocephalic atraumatic Eyes: Extraocular movements intact sclera anicteric Neck: Supple trachea midline Heart: S1-S2 appreciated no rubs Lungs: Clear to auscultation bilaterally and unlabored on room air Abdomen: Abdomen is soft with obese habitus and nontender normal bowel sounds Extremities: No edema appreciated no cyanosis or clubbing Skin: No rash on extremities exposed Neuro: Alert and oriented x3 provides a history and follows commands psych normal mood and affect     Basic Metabolic Panel: Recent Labs  Lab 10/23/19 0548 10/23/19 0548 10/23/19 0707 10/23/19 0707 10/23/19 1100 10/23/19 1529 10/24/19 0210 10/25/19 0517 10/26/19 0411  NA 139  --  138  --   --   --  137 134* 138  K 6.5*   < > 6.2*   < > 5.7* 5.9* 5.3* 4.4 5.2*  CL 110  --  108  --   --   --  105 97* 101  CO2 20*  --  23  --   --   --  24 25 25   GLUCOSE 90  --  106*  --   --   --  95 131* 100*  BUN 62*  --  60*  --   --   --  54* 45* 41*  CREATININE 3.42*  --  3.39*  --   --   --  2.62* 2.29* 2.10*  CALCIUM 10.6*   < > 10.3   < >  --   --  9.8 9.9 10.1  MG  --   --   --   --   --   --  1.7 1.7 2.0  PHOS  --   --   --   --   --   --  4.3 3.2 2.8   < > = values in this interval not displayed.    Liver Function Tests: Recent Labs  Lab 10/23/19 0132 10/24/19 0210 10/25/19 0517 10/26/19 0411  AST 25 25 25 22   ALT 28 29 28 27   ALKPHOS 98 94 89 94  BILITOT 0.2* 0.9 0.8 0.4  PROT 7.6 6.9 6.9 7.3  ALBUMIN 4.1 3.6 3.6 3.8   Recent Labs  Lab 10/23/19 0132  LIPASE 46   No results for input(s): AMMONIA in the last 168 hours.  CBC: Recent Labs  Lab 10/23/19 0132 10/23/19 0447 10/24/19 0210 10/25/19 0517 10/26/19 0411  WBC 14.0*  --  12.4* 10.9* 11.3*  NEUTROABS  --   --  8.6* 8.2* 7.7  HGB 11.7* 10.5* 10.0* 10.0* 10.4*  HCT 38.0* 31.0* 31.1* 31.1* 32.6*  MCV 95.2  --  90.4 90.9 92.1  PLT 190  --  149* 160 161    Cardiac Enzymes: No results for  input(s): CKTOTAL, CKMB, CKMBINDEX, TROPONINI in the last 168 hours.  BNP: Invalid input(s): POCBNP  CBG: Recent Labs  Lab 10/25/19 0732 10/25/19 1120 10/25/19 1623 10/25/19 1950 10/26/19 0654  GLUCAP 106* 91 85 103* 102*    Microbiology: Results for orders placed or performed during the hospital encounter of 10/23/19  Respiratory Panel by RT PCR (Flu A&B, Covid) - Nasopharyngeal Swab     Status: None   Collection Time: 10/23/19  3:07 AM   Specimen: Nasopharyngeal Swab  Result Value Ref Range Status   SARS Coronavirus 2 by RT PCR NEGATIVE NEGATIVE Final    Comment: (NOTE) SARS-CoV-2 target nucleic acids are NOT DETECTED. The SARS-CoV-2 RNA is generally detectable in upper respiratoy specimens during the acute phase of infection. The lowest concentration of SARS-CoV-2 viral copies this assay can detect is 131 copies/mL. A negative result does not preclude SARS-Cov-2 infection and should not be used as the sole basis for treatment or other patient management decisions. A negative result may occur with  improper specimen collection/handling, submission of specimen other than nasopharyngeal swab, presence of viral mutation(s) within the areas targeted by this assay, and inadequate number of viral copies (<131 copies/mL). A negative result must be combined with clinical observations, patient history, and epidemiological information. The expected result is Negative. Fact Sheet for Patients:  PinkCheek.be Fact Sheet for Healthcare Providers:  GravelBags.it This test is not yet ap proved or cleared by the [place] FDA and  has been authorized for detection and/or diagnosis of SARS-CoV-2 by FDA under an Emergency Use Authorization (EUA). This EUA will remain  in effect (meaning this test can be used) for the duration of the COVID-19 declaration under Section 564(b)(1) of the Act, 21 U.S.C. section 360bbb-3(b)(1),  unless the authorization is terminated or revoked sooner.    Influenza A by PCR NEGATIVE NEGATIVE Final   Influenza B by PCR NEGATIVE NEGATIVE Final    Comment: (NOTE) The Xpert Xpress SARS-CoV-2/FLU/RSV assay is intended as an aid in  the diagnosis of influenza from Nasopharyngeal swab specimens and  should not be used as a sole basis for treatment. Nasal washings and  aspirates are unacceptable for Xpert Xpress SARS-CoV-2/FLU/RSV  testing. Fact Sheet for Patients: PinkCheek.be Fact Sheet for Healthcare Providers: GravelBags.it This test is not yet approved or cleared by the [place] FDA and  has been authorized for detection and/or  diagnosis of SARS-CoV-2 by  FDA under an Emergency Use Authorization (EUA). This EUA will remain  in effect (meaning this test can be used) for the duration of the  Covid-19 declaration under Section 564(b)(1) of the Act, 21  U.S.C. section 360bbb-3(b)(1), unless the authorization is  terminated or revoked. Performed at [facility], [place] [address], [place], [place] [address]   Urine culture     Status: None   Collection Time: 10/23/19  4:55 AM   Specimen: Urine, Random  Result Value Ref Range Status   Specimen Description URINE, RANDOM  Final   Special Requests NONE  Final   Culture   Final    NO GROWTH Performed at [facility], [place] [address], [place], [place] [address]    Report Status 10/24/2019 FINAL  Final  C difficile quick scan w PCR reflex     Status: None   Collection Time: 10/23/19  5:58 AM   Specimen: Stool  Result Value Ref Range Status   C Diff antigen NEGATIVE NEGATIVE Final   C Diff toxin NEGATIVE NEGATIVE Final   C Diff interpretation No C. difficile detected.  Final    Comment: Performed at [facility], [place] [address] [place],  [address]    Coagulation Studies: No results for input(s): LABPROT, INR in the last 72  hours.  Urinalysis: No results for input(s): COLORURINE, LABSPEC, PHURINE, GLUCOSEU, HGBUR, BILIRUBINUR, KETONESUR, PROTEINUR, UROBILINOGEN, NITRITE, LEUKOCYTESUR in the last 72 hours.  Invalid input(s): APPERANCEUR    Imaging: No results found.   Medications:    . amiodarone  100 mg Oral Daily  . apixaban  5 mg Oral BID  . atorvastatin  40 mg Oral q1800  . carvedilol  25 mg Oral BID WC  . Chlorhexidine Gluconate Cloth  6 each Topical Daily  . insulin aspart  0-5 Units Subcutaneous QHS  . insulin aspart  0-6 Units Subcutaneous TID WC  . magnesium oxide  400 mg Oral BID  . pantoprazole  40 mg Oral Daily  . polyethylene glycol  17 g Oral BID  . senna-docusate  1 tablet Oral BID  . sodium bicarbonate  650 mg Oral TID   acetaminophen **OR** acetaminophen, hydrALAZINE, hydrOXYzine  Assessment/ Plan:   Acute kidney injury with baseline serum creatinine 1.3 in the setting of GI losses, spironolactone, valsartan.  UA did show 6-10 RBCs CT scan unremarkable continues to hold antihypertensives.  Creatinine seems to be improving with decreasing potassium.  Continue to avoid ACE inhibitors, ARB's, spironolactone.  No contrast no NSAIDs.  Hyperkalemia she seems to be improving continue Lokelma 10 g given 9 AM.  Renal function has improved as has potassium.  Mildly hyperkalemic we will continue Lokelma 10 g daily  Anemia stable at this point does not appear to be an issue  Bones appear stable continue to follow renal panel calcium phosphorus.  Metabolic acidosis has improved with sodium bicarbonate oral.  Continue to follow  Diabetes mellitus insulin sliding scale per primary service  Chronic heart failure with continue to hold spironolactone and valsartan due to risk of hyperkalemia.  Hypertension this appears to be an issue will initiate antihypertensive medications recommend restarting home medications but discontinue valsartan please as well spironolactone  Diarrhea continue to  follow with internal medicine appreciate assistance.  We will continue to hold on oral Lasix  History of atrial fibrillation would recommend reinitiation of amiodarone carvedilol Eliquis.   Will sign off 10/26/2019 follow-up can be with  [facility] as an outpatient please do not hesitate to call back if assistance needed  LOS: [place] @TODAY @7 :49 AM

## 2019-10-27 ENCOUNTER — Ambulatory Visit (INDEPENDENT_AMBULATORY_CARE_PROVIDER_SITE_OTHER): Payer: Medicare (Managed Care)

## 2019-10-27 DIAGNOSIS — I5022 Chronic systolic (congestive) heart failure: Secondary | ICD-10-CM | POA: Diagnosis not present

## 2019-10-27 DIAGNOSIS — Z95810 Presence of automatic (implantable) cardiac defibrillator: Secondary | ICD-10-CM

## 2019-10-27 LAB — GI PATHOGEN PANEL BY PCR, STOOL

## 2019-10-28 ENCOUNTER — Telehealth: Payer: Self-pay

## 2019-10-28 NOTE — Progress Notes (Signed)
EPIC Encounter for ICM Monitoring  Patient Name: Dean Musson Sr. is a 69 y.o. male Date: 10/28/2019 Primary Care Physican: [name], MD Primary Cardiologist:[facility] Electrophysiologist:[place] 06/03/2019 Weight: 254 lbs  Attempted call to patient and unable to reach. Transmission reviewed. Patient hospitalized from 1/28 - 1/31 for diarrhea.  Numerous med changes at time of discharge and is being followed by nephrology.   OptivolThoracic impedancesuggesting possible ongoing fluid accumulation since 10/22/2019 which correlates to hospitalization and the stopping of Lasix per discharge note.  Prescribed: Furosemide80 mg take 1 tablet (80 mg total) by mouth twice a day.Potassium 20 mEq take 2 tablets (40 mEq total) daily.  Labs: 10/26/2019 Creatinine 2.10, BUN 41, Potassium 5.2, Sodium 138, GFR 31-36 10/25/2019 Creatinine 2.29, BUN 45, Potassium 4.4, Sodium 134, GFR 28-33  10/24/2019 Creatinine 2.62, BUN 54, Potassium 5.3, Sodium 137, GFR 24-28  10/23/2019 Creatinine 3.39, BUN 60, Potassium 6.2, Sodium 138, GFR 17-20 (7:07AM) 10/23/2019  Creatinine 3.82, BUN 70, Potassium 7.3, Sodium 134, GFR 15-18 (1:32 AM)  06/07/2019 Creatinine1.56, BUN20, Potassium4.3, Sodium137, [id number] 06/06/2019 Creatinine1.29, BUN17, Potassium4.0, Sodium138, GFR56->60  06/05/2019 Creatinine1.31, BUN13, Potassium3.8, Sodium140, GFR55->60  A complete set of results can be found in Results Review.  Recommendations: Unable to reach.  Will advise patient post hospital office visit needs to be scheduled with Dr Debara Pickett or PA/NP.   Follow-up plan: ICM clinic phone appointment on 11/03/2019 to recheck fluid levels.   91 day device clinic remote transmission 12/31/2019.      Copy of ICM check sent to Dr. Curt Bears and Dr Debara Pickett.    3 month ICM trend: 10/27/2019    1 Year ICM trend:       Rosalene Billings, RN 10/28/2019 5:01 PM

## 2019-10-28 NOTE — Telephone Encounter (Signed)
Remote ICM transmission received.  Attempted call to patient regarding ICM remote transmission and no answer or voice mail box. 

## 2019-10-31 NOTE — Progress Notes (Signed)
Received: Yesterday Message Contents  [name], MD  Ebelyn Bohnet Panda, RN  Patient had multiple HF meds stopped while in the hospital with AKI. Will need cardiology APP appointment to discuss restarting.

## 2019-10-31 NOTE — Progress Notes (Signed)
Message sent to Dr [facility] office to call patient to schedule office visit with APP.

## 2019-11-03 ENCOUNTER — Ambulatory Visit (INDEPENDENT_AMBULATORY_CARE_PROVIDER_SITE_OTHER): Payer: Medicare (Managed Care)

## 2019-11-03 ENCOUNTER — Telehealth: Payer: Self-pay

## 2019-11-03 DIAGNOSIS — Z95810 Presence of automatic (implantable) cardiac defibrillator: Secondary | ICD-10-CM

## 2019-11-03 DIAGNOSIS — I5022 Chronic systolic (congestive) heart failure: Secondary | ICD-10-CM

## 2019-11-03 NOTE — Telephone Encounter (Signed)
Device clinic called and asked if this pt could be seen for f/u visit after hospital. Stated that the pt was taken off a bunch of cardiac meds and is now fluid overloaded. Scheduled appt tomorrow with Coletta Memos, NP. Pt verbalized understanding.

## 2019-11-03 NOTE — Progress Notes (Signed)
EPIC Encounter for ICM Monitoring  Patient Name: Dean Alessandrini Sr. is a 69 y.o. male Date: 11/03/2019 Primary Care Physican: [name], MD Primary Cardiologist:[place] Electrophysiologist:Camn[place] Last Weight: 254 lbs  Attempted call to patient and unable to reach. Transmission reviewed.   OptivolThoracic impedancesuggesting possible ongoing fluid accumulation since 10/22/2019 which correlates to hospitalization and the stopping of Lasix per discharge note.  Prescribed: Furosemide80 mg take 1 tablet (80 mg total) by mouth twice a day.Potassium 20 mEq take 2 tablets (40 mEq total) daily.  Labs: 10/26/2019 Creatinine 2.10, BUN 41, Potassium 5.2, Sodium 138, GFR 31-36 10/25/2019 Creatinine 2.29, BUN 45, Potassium 4.4, Sodium 134, GFR 28-33  10/24/2019 Creatinine 2.62, BUN 54, Potassium 5.3, Sodium 137, GFR 24-28  10/23/2019 Creatinine 3.39, BUN 60, Potassium 6.2, Sodium 138, GFR 17-20 (7:07AM) 10/23/2019  Creatinine 3.82, BUN 70, Potassium 7.3, Sodium 134, GFR 15-18 (1:32 AM)  A complete set of results can be found in Results Review.  Recommendations:Spoke with Kayla at Dr [facility] office today and advised patient needs post hospital office visit due to changes in heart medications and device suggesting fluid accumulation.  Patient scheduled for 2/9 with Coletta Memos, NP at [facility] office.  Follow-up plan: ICM clinic phone appointment on2/15/2021 (manual send)to recheck fluid levels. 91 day device clinic remote transmission 12/31/2019.   Copy of ICM check sent to [facility] and Dr Debara Pickett.    3 month ICM trend: 11/03/2019    1 Year ICM trend:       Rosalene Billings, RN 11/03/2019 12:50 PM

## 2019-11-03 NOTE — Progress Notes (Deleted)
Cardiology Clinic Note   Patient Name: Dean Persinger Sr. Date of Encounter: 11/03/2019  Primary Care Provider:  [name], MD Primary Cardiologist:  Pixie Casino, MD  Patient Profile    Dean [name] 69 year old male presents today for evaluation of his fluid status.  Past Medical History    Past Medical History:  Diagnosis Date  . 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 Arthritis    "maybe in his spine" (09/05/2016)  . Asthma    pt. denies  . Cardiac arrest ([place]) 08/27/2016   REQUIRING CPR, SHOCK & MEDICATIONS  . CHF (congestive heart failure) ([place])   . Coronary artery disease    90% ostRCA stenosis and total occlusion of dRCA with L-->R collaterals 2017  . Dysrhythmia   . Gout   . High cholesterol   . Hypertension   . Ischemic cardiomyopathy   . LBBB (left bundle branch block)   . OSA on CPAP   . Type II diabetes mellitus ([place])    Past Surgical History:  Procedure Laterality Date  . CARDIAC CATHETERIZATION N/A 09/07/2016   Procedure: Left Heart Cath and Coronary Angiography;  Surgeon: Peter M Martinique, MD;  Location: [facility];  Service: Cardiovascular;  Laterality: N/A;  . COLONOSCOPY WITH PROPOFOL N/A 08/30/2018   Procedure: COLONOSCOPY WITH PROPOFOL;  Surgeon: Carol Ada, MD;  Location: [facility];  Service: Endoscopy;  Laterality: N/A;  . EP IMPLANTABLE DEVICE N/A 10/10/2016   Procedure: ICD Implant;  Surgeon: Will Meredith Leeds, MD;  Location: [facility];  Service: Cardiovascular;  Laterality: N/A;  . LUMBAR LAMINECTOMY/DECOMPRESSION MICRODISCECTOMY N/A 01/18/2018   Procedure: L2-3, L3-4, L-4-5 CENTRAL DECOMPRESSION;  Surgeon: [name], MD;  Location: [facility];  Service: Orthopedics;  Laterality: N/A;  . POLYPECTOMY  08/30/2018   Procedure: POLYPECTOMY;  Surgeon: Carol Ada, MD;  Location: [facility];  Service: Endoscopy;;    Allergies  Allergies  Allergen Reactions  . Contrast Media  [Iodinated Diagnostic Agents] Shortness Of Breath and Nausea And Vomiting  . Lisinopril Cough    History of Present Illness    Dean [name] has a past medical history of coronary artery disease, ischemic cardiomyopathy, PAF, left bundle branch block, history of VT/V. fib arrest status post AICD, HTN, HLD, gout, obstructive sleep apnea on CPAP, diabetes mellitus,  CKD 3.  Acute on chronic CHF, LVEF 30-35%.  His dry weight is somewhere around 245-250 pounds.  He was last seen by Dr. Debara Pickett on 10/20/2019.  During that time he was noted to have diarrhea and he indicated that he was instructed to hold his furosemide.  He had been off his furosemide for about 1 week left and his weight was increasing at that time.  He denied increasing symptoms of heart failure.  His device check during that time showed no significant events on his ICD.    He presented to the emergency department on 10/23/2019-10/26/2019 with complaints of intermittent diarrhea lasting for 2 weeks.  He has been taking over-the-counter medication including Pepto-Bismol Imodium.  At that time he complains of no abdominal pain, nausea, or vomiting.  No fevers or chills.  No recent use of antibiotics and no sick contacts.  He denied chest pain, cough, and shortness of breath.  His potassium was 7.3 and his EKG showed PR prolongation widening QRS complexes.  His creatinine at that time was 3.8 with a baseline creatinine of 1.2.  He received a normal saline bolus 250 mL normal saline and his  repeat potassium was 6.7.  At discharge his potassium was 5.2, he was following with nephrology and has been prescribed Lokelma.  He was instructed to follow-up with nephrology.  His BUN/creatinine had decreased to 41/2.10.  Patient was noted to have an increase in his OptiVol fluid index accumulation.  He was prescribed furosemide 80 mg twice daily with potassium 40 mEq daily.  He presents to the clinic today and states***  *** denies chest pain, shortness of  breath, lower extremity edema, fatigue, palpitations, melena, hematuria, hemoptysis, diaphoresis, weakness, presyncope, syncope, orthopnea, and PND.   Home Medications    Prior to Admission medications   Medication Sig Start Date End Date Taking? Authorizing Provider  allopurinol (ZYLOPRIM) 300 MG tablet Take 300 mg by mouth daily. 08/16/16   [provider]  amiodarone (PACERONE) 100 MG tablet Take 1 tablet (100 mg total) by mouth daily. 10/20/19   Hilty, Nadean Corwin, MD  atorvastatin (LIPITOR) 40 MG tablet Take 1 tablet (40 mg total) by mouth daily at 6 PM. 10/27/16   Camnitz, Ocie Doyne, MD  carvedilol (COREG) 25 MG tablet Take 1 tablet (25 mg total) by mouth 2 (two) times daily with a meal. 05/31/18   Duke, Tami Lin, PA  [employer] VITAMIN D3 1000 units capsule Take 1,000 Units by mouth daily. 08/16/16   [provider]  ELIQUIS 5 MG TABS tablet TAKE 1 TABLET BY MOUTH TWICE A DAY 09/09/19   Hilty, Nadean Corwin, MD  furosemide (LASIX) 80 MG tablet Take 1 tablet (80 mg total) by mouth daily. RESUME When ok with Nephrology 11/02/19   Raiford Noble Latif, DO  hydrOXYzine (ATARAX/VISTARIL) 25 MG tablet Take 1 tablet (25 mg total) by mouth 3 (three) times daily as needed for anxiety. 10/26/19   Raiford Noble Latif, DO  magnesium oxide (MAG-OX) 400 MG tablet Take 200 mg by mouth 2 (two) times daily.    [provider]  pantoprazole (PROTONIX) 40 MG tablet Take 1 tablet (40 mg total) by mouth daily. 07/01/18   Duke, Tami Lin, PA  sodium bicarbonate 650 MG tablet Take 1 tablet (650 mg total) by mouth 3 (three) times daily. 10/26/19   Raiford Noble Latif, DO  sodium zirconium cyclosilicate (LOKELMA) 10 g PACK packet Take 10 g by mouth daily. Have Labs Repeated (K+) within a Week and Have Nephrology Review to see if needs to be continued 10/26/19   Raiford Noble Latif, DO  ZINC-VITAMIN C PO Take 1 tablet by mouth daily.    [provider]    Family History    Family History    Problem Relation Age of Onset  . Heart attack Father        died in his 31's  . Hypertension Sister   . Cancer Brother        uncertain, "leg"  . Hypertension Sister    He indicated that his father is deceased. He indicated that both of his sisters are alive. He indicated that his brother is deceased.  Social History    Social History   Socioeconomic History  . Marital status: Widowed    Spouse name: Not on file  . Number of children: Not on file  . Years of education: Not on file  . Highest education level: Not on file  Occupational History  . Not on file  Tobacco Use  . Smoking status: Never Smoker  . Smokeless tobacco: Never Used  Substance and Sexual Activity  . Alcohol use: No  . Drug  use: No  . Sexual activity: Not on file  Other Topics Concern  . Not on file  Social History Narrative  . Not on file   Social Determinants of Health   Financial Resource Strain:   . Difficulty of Paying Living Expenses: Not on file  Food Insecurity:   . Worried About Charity fundraiser in the Last Year: Not on file  . Ran Out of Food in the Last Year: Not on file  Transportation Needs:   . Lack of Transportation (Medical): Not on file  . Lack of Transportation (Non-Medical): Not on file  Physical Activity:   . Days of Exercise per Week: Not on file  . Minutes of Exercise per Session: Not on file  Stress:   . Feeling of Stress : Not on file  Social Connections:   . Frequency of Communication with Friends and Family: Not on file  . Frequency of Social Gatherings with Friends and Family: Not on file  . Attends Religious Services: Not on file  . Active Member of Clubs or Organizations: Not on file  . Attends Archivist Meetings: Not on file  . Marital Status: Not on file  Intimate Partner Violence:   . Fear of Current or Ex-Partner: Not on file  . Emotionally Abused: Not on file  . Physically Abused: Not on file  . Sexually Abused: Not on file     Review of  Systems    General:  No chills, fever, night sweats or weight changes.  Cardiovascular:  No chest pain, dyspnea on exertion, edema, orthopnea, palpitations, paroxysmal nocturnal dyspnea. Dermatological: No rash, lesions/masses Respiratory: No cough, dyspnea Urologic: No hematuria, dysuria Abdominal:   No nausea, vomiting, diarrhea, bright red blood per rectum, melena, or hematemesis Neurologic:  No visual changes, wkns, changes in mental status. All other systems reviewed and are otherwise negative except as noted above.  Physical Exam    VS:  There were no vitals taken for this visit. , BMI There is no height or weight on file to calculate BMI. GEN: Well nourished, well developed, in no acute distress. HEENT: normal. Neck: Supple, no JVD, carotid bruits, or masses. Cardiac: RRR, no murmurs, rubs, or gallops. No clubbing, cyanosis, edema.  Radials/DP/PT 2+ and equal bilaterally.  Respiratory:  Respirations regular and unlabored, clear to auscultation bilaterally. GI: Soft, nontender, nondistended, BS + x 4. MS: no deformity or atrophy. Skin: warm and dry, no rash. Neuro:  Strength and sensation are intact. Psych: Normal affect.  Accessory Clinical Findings    ECG personally reviewed by me today- *** - No acute changes  EKG 10/23/2019 Sinus rhythm prolonged PR interval nonspecific IVCD with LAD 72 bpm  Echocardiogram, 04/07/2019 IMPRESSIONS    1. The left ventricle has moderate-severely reduced systolic function,  with an ejection fraction of 30-35%. The cavity size was normal. Left  ventricular diastolic Doppler parameters are consistent with impaired  relaxation. Left ventricular diffuse  hypokinesis.  2. The right ventricle has normal systolic function. The cavity was  normal. There is no increase in right ventricular wall thickness.  3. Left atrial size was severely dilated.  4. Right atrial size was mildly dilated.  5. The mitral valve is degenerative. Mild  calcification of the mitral  valve leaflet. Mitral valve regurgitation is moderate by color flow  Doppler.  6. The aortic valve is tricuspid. Mild calcification of the aortic valve.  Aortic valve regurgitation is trivial by color flow Doppler. No stenosis  of the  aortic valve.  7. There is mild dilatation of the aortic root measuring 37 mm.  8. The inferior vena cava was dilated in size with >50% respiratory  variability. PA systolic pressure 41 mmHg .    Assessment & Plan   1.  Acute on chronic systolic CHF-LVEF 99991111.  Weight today***.  OptiVol thoracic impedance on 11/13/2019 showed fluid accumulation.  Patient diuretics were restarted and increased to furosemide 80 twice daily with potassium 40 Amick use daily. Decreased to furosemide 80 mg daily Change to 20 mEq potassium daily Heart healthy low-sodium diet-salty 6 given Increase physical activity as tolerated Daily weights  Paroxysmal atrial fibrillation-heart rate today***.  Noted to be in sinus rhythm/sinus bradycardia on two previous device downloads. Continue Eliquis 5 mg twice daily Continue amiodarone 100 mg daily- may d/c in 4/21 if no A.fib per Dr. Debara Pickett  Continue carvedilol 25 mg twice daily Avoid triggers caffeine, chocolate, EtOH, OSA, etc. Heart healthy low-sodium diet Increase physical activity as tolerated Order BMP  Essential hypertension-BP today*** Continue carvedilol 25 mg twice daily Continue furosemide 80 daily Heart healthy low-sodium diet Increase physical activity as tolerated  Hyperlipidemia-LDL 105 09/07/2016 Continue atorvastatin 40 mg daily Heart healthy low-sodium high-fiber diet Increase physical activity as tolerated Repeat lipid panel  Obstructive sleep apnea-using CPAP*** Continue CPAP use Daily weights  History of VT/VF arrest-status post AICD. Monitored by EP  Disposition: Follow-up with Dr. Debara Pickett 01/23/2020   Jossie Ng. [facility]  [facility] [address] Office [phone] Fax [phone]

## 2019-11-03 NOTE — Telephone Encounter (Signed)
Remote ICM transmission received.  Attempted call to patient regarding ICM remote transmission and no answer or voice mail box. 

## 2019-11-04 ENCOUNTER — Ambulatory Visit: Payer: Medicare (Managed Care) | Admitting: General Practice

## 2019-11-04 NOTE — Telephone Encounter (Signed)
Patient calling back with questions about his medication instructions.

## 2019-11-04 NOTE — Progress Notes (Signed)
Office visit with Coletta Memos rescheduled for 11/05/2019.  Per Veronda Prude, LPN at [facility] office, she spoke with patient this AM and requested a new remote transmission.  Patient's weight is 255 lbs.   Optivol impedance showing slight improvement 2/9 but still continues to suggest possible fluid accumulation.   Copy sent to Caleen Essex, NP for review for patient's 11/05/2019 office visit.

## 2019-11-04 NOTE — Telephone Encounter (Signed)
Noted S/w pt given instructions per Denyse Amass cleaver. Pt will be in tomorrow for scheduled appt

## 2019-11-04 NOTE — Progress Notes (Signed)
Cardiology Clinic Note   Patient Name: Dean Niven Sr. Date of Encounter: 11/05/2019  Primary Care Provider:  [name], MD Primary Cardiologist:  Pixie Casino, MD  Patient Profile    [name] 69 year old male presents today for evaluation of his fluid status.  Past Medical History    Past Medical History:  Diagnosis Date  . 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 Arthritis    "maybe in his spine" (09/05/2016)  . Asthma    pt. denies  . Cardiac arrest ([place]) 08/27/2016   REQUIRING CPR, SHOCK & MEDICATIONS  . CHF (congestive heart failure) ([place])   . Coronary artery disease    90% ostRCA stenosis and total occlusion of dRCA with L-->R collaterals 2017  . Dysrhythmia   . Gout   . High cholesterol   . Hypertension   . Ischemic cardiomyopathy   . LBBB (left bundle branch block)   . OSA on CPAP   . Type II diabetes mellitus ([place])    Past Surgical History:  Procedure Laterality Date  . CARDIAC CATHETERIZATION N/A 09/07/2016   Procedure: Left Heart Cath and Coronary Angiography;  Surgeon: Peter M Martinique, MD;  Location: [facility];  Service: Cardiovascular;  Laterality: N/A;  . COLONOSCOPY WITH PROPOFOL N/A 08/30/2018   Procedure: COLONOSCOPY WITH PROPOFOL;  Surgeon: Carol Ada, MD;  Location: [facility];  Service: Endoscopy;  Laterality: N/A;  . EP IMPLANTABLE DEVICE N/A 10/10/2016   Procedure: ICD Implant;  Surgeon: Will Meredith Leeds, MD;  Location: [facility];  Service: Cardiovascular;  Laterality: N/A;  . LUMBAR LAMINECTOMY/DECOMPRESSION MICRODISCECTOMY N/A 01/18/2018   Procedure: L2-3, L3-4, L-4-5 CENTRAL DECOMPRESSION;  Surgeon: [name], MD;  Location: [facility];  Service: Orthopedics;  Laterality: N/A;  . POLYPECTOMY  08/30/2018   Procedure: POLYPECTOMY;  Surgeon: Carol Ada, MD;  Location: [facility];  Service: Endoscopy;;    Allergies  Allergies  Allergen Reactions  . Contrast Media  [Iodinated Diagnostic Agents] Shortness Of Breath and Nausea And Vomiting  . Lisinopril Cough    History of Present Illness    Mr. Dean [name] has a past medical history of coronary artery disease, ischemic cardiomyopathy, PAF, left bundle branch block, history of VT/V. fib arrest status post AICD, HTN, HLD, gout, obstructive sleep apnea on CPAP, diabetes mellitus,  CKD 3.  Acute on chronic CHF, LVEF 30-35%.  His dry weight is somewhere around 245-250 pounds.   He was last seen by Dr. Debara Pickett on 10/20/2019.  During that time he was noted to have diarrhea and he indicated that he was instructed to hold his furosemide.  He had been off his furosemide for about 1 week left and his weight was increasing at that time.  He denied increasing symptoms of heart failure.  His device check during that time showed no significant events on his ICD.     He presented to the emergency department on 10/23/2019-10/26/2019 with complaints of intermittent diarrhea lasting for 2 weeks.  He has been taking over-the-counter medication including Pepto-Bismol Imodium.  At that time he complains of no abdominal pain, nausea, or vomiting.  No fevers or chills.  No recent use of antibiotics and no sick contacts.  He denied chest pain, cough, and shortness of breath.  His potassium was 7.3 and his EKG showed PR prolongation widening QRS complexes.  His creatinine at that time was 3.8 with a baseline creatinine of 1.2.  He received a normal saline bolus 250 mL normal saline  and his repeat potassium was 6.7.  At discharge his potassium was 5.2, he was following with nephrology and has been prescribed Lokelma.  He was instructed to follow-up with nephrology.  His BUN/creatinine had decreased to 41/2.10.   Patient was noted to have an increase in his OptiVol fluid index accumulation.  He was prescribed furosemide 80 mg twice daily with potassium 40 mEq daily.   He presents to the clinic today and states he feels fairly well.  He has not  noticed a large amount of fluid accumulation or lower extremity edema since being discharged from the hospital on 10/26/2019.  His activity level is slowly returning to normal.  He states he is trying to stay away from salt in his diet.  Over the summer 2020 he states he did not have any lower extremity edema and felt very well on his medication regimen.  I will recheck a BMP today.  Have him continue with 80 mg of Lasix for the next 2 days along with 40 mEq of potassium and then changed to 40 mg of Lasix with 20 mill equivalents of potassium and have him follow-up in 1 week.   He denies chest pain, shortness of breath, lower extremity edema, fatigue, palpitations, melena, hematuria, hemoptysis, diaphoresis, weakness, presyncope, syncope, orthopnea, and PND.  Home Medications    Prior to Admission medications   Medication Sig Start Date End Date Taking? Authorizing Provider  allopurinol (ZYLOPRIM) 300 MG tablet Take 300 mg by mouth daily. 08/16/16   [provider]  amiodarone (PACERONE) 100 MG tablet Take 1 tablet (100 mg total) by mouth daily. 10/20/19   Hilty, Nadean Corwin, MD  atorvastatin (LIPITOR) 40 MG tablet Take 1 tablet (40 mg total) by mouth daily at 6 PM. 10/27/16   Camnitz, Ocie Doyne, MD  carvedilol (COREG) 25 MG tablet Take 1 tablet (25 mg total) by mouth 2 (two) times daily with a meal. 05/31/18   Duke, Tami Lin, PA  [employer] VITAMIN D3 1000 units capsule Take 1,000 Units by mouth daily. 08/16/16   [provider]  ELIQUIS 5 MG TABS tablet TAKE 1 TABLET BY MOUTH TWICE A DAY 09/09/19   Hilty, Nadean Corwin, MD  furosemide (LASIX) 80 MG tablet Take 1 tablet (80 mg total) by mouth daily. RESUME When ok with Nephrology 11/02/19   Raiford Noble Latif, DO  hydrOXYzine (ATARAX/VISTARIL) 25 MG tablet Take 1 tablet (25 mg total) by mouth 3 (three) times daily as needed for anxiety. 10/26/19   Raiford Noble Latif, DO  magnesium oxide (MAG-OX) 400 MG tablet Take 200 mg by mouth 2 (two) times  daily.    [provider]  pantoprazole (PROTONIX) 40 MG tablet Take 1 tablet (40 mg total) by mouth daily. 07/01/18   Duke, Tami Lin, PA  sodium bicarbonate 650 MG tablet Take 1 tablet (650 mg total) by mouth 3 (three) times daily. 10/26/19   Raiford Noble Latif, DO  sodium zirconium cyclosilicate (LOKELMA) 10 g PACK packet Take 10 g by mouth daily. Have Labs Repeated (K+) within a Week and Have Nephrology Review to see if needs to be continued 10/26/19   Raiford Noble Latif, DO  ZINC-VITAMIN C PO Take 1 tablet by mouth daily.    [provider]    Family History    Family History  Problem Relation Age of Onset  . Heart attack Father        died in his 86's  . Hypertension Sister   . Cancer Brother  uncertain, "leg"  . Hypertension Sister    He indicated that his father is deceased. He indicated that both of his sisters are alive. He indicated that his brother is deceased.  Social History    Social History   Socioeconomic History  . Marital status: Widowed    Spouse name: Not on file  . Number of children: Not on file  . Years of education: Not on file  . Highest education level: Not on file  Occupational History  . Not on file  Tobacco Use  . Smoking status: Never Smoker  . Smokeless tobacco: Never Used  Substance and Sexual Activity  . Alcohol use: No  . Drug use: No  . Sexual activity: Not on file  Other Topics Concern  . Not on file  Social History Narrative  . Not on file   Social Determinants of Health   Financial Resource Strain:   . Difficulty of Paying Living Expenses: Not on file  Food Insecurity:   . Worried About Charity fundraiser in the Last Year: Not on file  . Ran Out of Food in the Last Year: Not on file  Transportation Needs:   . Lack of Transportation (Medical): Not on file  . Lack of Transportation (Non-Medical): Not on file  Physical Activity:   . Days of Exercise per Week: Not on file  . Minutes of Exercise per  Session: Not on file  Stress:   . Feeling of Stress : Not on file  Social Connections:   . Frequency of Communication with Friends and Family: Not on file  . Frequency of Social Gatherings with Friends and Family: Not on file  . Attends Religious Services: Not on file  . Active Member of Clubs or Organizations: Not on file  . Attends Archivist Meetings: Not on file  . Marital Status: Not on file  Intimate Partner Violence:   . Fear of Current or Ex-Partner: Not on file  . Emotionally Abused: Not on file  . Physically Abused: Not on file  . Sexually Abused: Not on file     Review of Systems    General:  No chills, fever, night sweats or weight changes.  Cardiovascular:  No chest pain, dyspnea on exertion, edema, orthopnea, palpitations, paroxysmal nocturnal dyspnea. Dermatological: No rash, lesions/masses Respiratory: No cough, dyspnea Urologic: No hematuria, dysuria Abdominal:   No nausea, vomiting, diarrhea, bright red blood per rectum, melena, or hematemesis Neurologic:  No visual changes, wkns, changes in mental status. All other systems reviewed and are otherwise negative except as noted above.  Physical Exam    VS:  BP (!) 160/60   Pulse 82   Ht 5\' 6"  (1.676 m)   Wt 260 lb (117.9 kg)   SpO2 99%   BMI 41.97 kg/m  , BMI Body mass index is 41.97 kg/m. GEN: Well nourished, well developed, in no acute distress. HEENT: normal. Neck: Supple, no JVD, carotid bruits, or masses. Cardiac: RRR, no murmurs, rubs, or gallops. No clubbing, cyanosis, edema.  Radials/DP/PT 2+ and equal bilaterally.  Respiratory:  Respirations regular and unlabored, clear to auscultation bilaterally. GI: Soft, nontender, nondistended, BS + x 4. MS: no deformity or atrophy. Skin: warm and dry, no rash. Neuro:  Strength and sensation are intact. Psych: Normal affect.  Accessory Clinical Findings    ECG personally reviewed by me today-sinus rhythm with first-degree AV block left bundle  branch block 83 bpm- No acute changes   EKG 10/23/2019 Sinus rhythm prolonged  PR interval nonspecific IVCD with LAD 72 bpm   Echocardiogram, 04/07/2019 IMPRESSIONS     1. The left ventricle has moderate-severely reduced systolic function,  with an ejection fraction of 30-35%. The cavity size was normal. Left  ventricular diastolic Doppler parameters are consistent with impaired  relaxation. Left ventricular diffuse  hypokinesis.   2. The right ventricle has normal systolic function. The cavity was  normal. There is no increase in right ventricular wall thickness.   3. Left atrial size was severely dilated.   4. Right atrial size was mildly dilated.   5. The mitral valve is degenerative. Mild calcification of the mitral  valve leaflet. Mitral valve regurgitation is moderate by color flow  Doppler.   6. The aortic valve is tricuspid. Mild calcification of the aortic valve.  Aortic valve regurgitation is trivial by color flow Doppler. No stenosis  of the aortic valve.   7. There is mild dilatation of the aortic root measuring 37 mm.   8. The inferior vena cava was dilated in size with >50% respiratory  variability. PA systolic pressure 41 mmHg .    Assessment & Plan   1.  Acute on chronic systolic CHF-LVEF 99991111.  Weight today 260 pounds.  OptiVol thoracic impedance on 11/13/2019 showed fluid accumulation.  Patient diuretics were restarted and increased to furosemide 80 twice daily with potassium 40 meq daily. Decreased to furosemide 80 mg daily for the next 2 days then resume 40 mg daily Change to 40 mEq potassium daily for the next 2 days then resume 20 mEq daily Heart healthy low-sodium diet-salty 6 given Increase physical activity as tolerated Daily weights  Ordered BMP today and repeat in 1 week  Paroxysmal atrial fibrillation-heart rate today 83 bpm.  Noted to be in sinus rhythm/sinus bradycardia on two previous device downloads. Continue Eliquis 5 mg twice daily Continue  amiodarone 100 mg daily- may d/c in 4/21 if no A.fib per Dr. Debara Pickett  Continue carvedilol 25 mg twice daily Avoid triggers caffeine, chocolate, EtOH, OSA, etc. Heart healthy low-sodium diet Increase physical activity as tolerated Order BMP   Essential hypertension-BP today 160/60.  Better controlled at home we will continue to monitor. Continue carvedilol 25 mg twice daily Continue furosemide 80  Heart healthy low-sodium diet-salty 6 given Increase physical activity as tolerated  Blood pressure log given.  Hyperlipidemia-LDL 105 09/07/2016 Continue atorvastatin 40 mg daily Heart healthy low-sodium high-fiber diet Increase physical activity as tolerated    Obstructive sleep apnea- CPAP compliant.  States his device is getting old.  Still working but intermittent at times.  He will contact his CPAP supplier. Continue CPAP use Daily weights   History of VT/VF arrest-status post AICD. Monitored by EP   Disposition: Follow-up with me in 1 week.  Jossie Ng. Supreme Rybarczyk NP-C  Yet let me: Here    [place] [address] Office [phone] Fax [phone]

## 2019-11-04 NOTE — Telephone Encounter (Signed)
I spoke with patient. His appointment with Coletta Memos, NP is now tomorrow. Patient states he received call today requesting he send a transmission in. He did this and received a call back with lasix and potassium instructions. He was to have recent labs faxed to our office. Patient reports labs are being faxed and his last potassium was 3.9.  He is asking what potassium dose he should take based on these results. I told him I would send message to device clinic and Deniece Ree regarding this.  Patient requests call back on cell phone [phone]

## 2019-11-04 NOTE — Telephone Encounter (Signed)
Message sent to Veronda Prude, LPN for follow up since she spoke with patient this morning regarding transmission and meds.

## 2019-11-05 ENCOUNTER — Ambulatory Visit (INDEPENDENT_AMBULATORY_CARE_PROVIDER_SITE_OTHER): Payer: Medicare (Managed Care) | Admitting: General Practice

## 2019-11-05 ENCOUNTER — Encounter: Payer: Self-pay | Admitting: General Practice

## 2019-11-05 ENCOUNTER — Other Ambulatory Visit: Payer: Self-pay

## 2019-11-05 VITALS — BP 160/60 | HR 82 | Ht 66.0 in | Wt 260.0 lb

## 2019-11-05 DIAGNOSIS — I5043 Acute on chronic combined systolic (congestive) and diastolic (congestive) heart failure: Secondary | ICD-10-CM

## 2019-11-05 DIAGNOSIS — Z79899 Other long term (current) drug therapy: Secondary | ICD-10-CM | POA: Diagnosis not present

## 2019-11-05 DIAGNOSIS — I10 Essential (primary) hypertension: Secondary | ICD-10-CM

## 2019-11-05 DIAGNOSIS — I480 Paroxysmal atrial fibrillation: Secondary | ICD-10-CM | POA: Diagnosis not present

## 2019-11-05 DIAGNOSIS — E785 Hyperlipidemia, unspecified: Secondary | ICD-10-CM

## 2019-11-05 DIAGNOSIS — Z95810 Presence of automatic (implantable) cardiac defibrillator: Secondary | ICD-10-CM

## 2019-11-05 DIAGNOSIS — I472 Ventricular tachycardia, unspecified: Secondary | ICD-10-CM

## 2019-11-05 MED ORDER — POTASSIUM CHLORIDE CRYS ER 20 MEQ PO TBCR: 20.0000 meq | EXTENDED_RELEASE_TABLET | Freq: Every day | ORAL | 3 refills | Status: DC

## 2019-11-05 MED ORDER — FUROSEMIDE 40 MG PO TABS: 80.0000 mg | ORAL_TABLET | Freq: Every day | ORAL | 6 refills | Status: DC

## 2019-11-05 NOTE — Patient Instructions (Addendum)
Medication Instructions:  TAKE FUROSEMIDE 80MG  X2 DAYS THEN TAKE 40MG  DAILY-THURSDAY AND Friday THEN BACK TO 40MG  DAILY TAKE POTASSIUM 40MEQ X2 DAYS THEN TAKE 20MEQ DAILY-THURSDAY AND Friday THEN BACK TO 20MEQ DAILY If you need a refill on your cardiac medications before your next appointment, please call your pharmacy.  Labwork: BMET TODAY HERE IN OUR OFFICE AT [facility]    If you have labs (blood work) drawn today and your tests are completely normal, you will receive your results only by: [name] MyChart Message (if you have MyChart) OR A paper copy in the mail If you have any lab test that is abnormal or we need to change your treatment, we will call you to review these results. You may go to any [facility] lab that is convenient for you however, we do have a lab in our office that is able to assist you. You do NOT need an appointment for our lab. Once in our office in our office lobby there is a podium where you sign-in and ring the doorbell to alert [place] that you are here. Lab is open 8:00am and closes at 4:00pm; closes for lunch from 12:45 - 1:45pm. PLEASE BRING A COPY OF YOUR INSURANCE CARD WITH YOU.  Special Instructions: TAKE AND LOG YOUR BLOOD PRESSURE AND WEIGHT DAILY-GOAL IS 250 Lb  PLEASE READ AND FOLLOW SALTY 6 ATTACHED  PLEASE READ AND FOLLOW HEART FAILURE INSTRUCTIONS-ATTACHED  Reduce your risk of getting COVID-19 With your heart disease it is especially important for people at increased risk of severe illness from COVID-19, and those who live with them, to protect themselves from getting COVID-19. The best way to protect yourself and to help reduce the spread of the virus that causes COVID-19 is to: [name] Limit your interactions with other people as much as possible. . Take precautions to prevent getting COVID-19 when you do interact with others. If you start feeling sick and think you may have COVID-19, get in touch with your healthcare provider within 24 hours.  Follow-Up: 1 WEEK  In  [place], FNP.    At [facility], you and your health needs are our priority.  As part of our continuing mission to provide you with exceptional heart care, we have created designated Provider Care Teams.  These Care Teams include your primary Cardiologist (physician) and Advanced Practice Providers (APPs -  Physician Assistants and Nurse Practitioners) who all work together to provide you with the care you need, when you need it.  Thank you for choosing [facility] at [facility]!!        Heart Failure Action Plan A heart failure action plan helps you understand what to do when you have symptoms of heart failure. Follow the plan that was created by you and your health care provider. Review your plan each time you visit your health care provider. Red zone These signs and symptoms mean you should get medical help right away:  You have trouble breathing when resting.  You have a dry cough that is getting worse.  You have swelling or pain in your legs or abdomen that is getting worse.  You suddenly gain more than 2-3 lb (0.9-1.4 kg) in a day, or more than 5 lb (2.3 kg) in one week. This amount may be more or less depending on your condition.  You have trouble staying awake or you feel confused.  You have chest pain.  You do not have an appetite.  You pass out. If you experience any of  these symptoms:  Call your local emergency services (911 in the [place] right away or seek help at the emergency department of the nearest hospital. Yellow zone These signs and symptoms mean your condition may be getting worse and you should make some changes:  You have trouble breathing when you are active or you need to sleep with extra pillows.  You have swelling in your legs or abdomen.  You gain 2-3 lb (0.9-1.4 kg) in one day, or 5 lb (2.3 kg) in one week. This amount may be more or less depending on your condition.  You get tired easily.  You have trouble sleeping.  You  have a dry cough. If you experience any of these symptoms:  Contact your health care provider within the next day.  Your health care provider may adjust your medicines. Green zone These signs mean you are doing well and can continue what you are doing:  You do not have shortness of breath.  You have very little swelling or no new swelling.  Your weight is stable (no gain or loss).  You have a normal activity level.  You do not have chest pain or any other new symptoms. Follow these instructions at home:  Take over-the-counter and prescription medicines only as told by your health care provider.  Weigh yourself daily. Your target weight is __________ lb (__________ kg). ? Call your health care provider if you gain more than __________ lb (__________ kg) in a day, or more than __________ lb (__________ kg) in one week.  Eat a heart-healthy diet. Work with a diet and nutrition specialist (dietitian) to create an eating plan that is best for you.  Keep all follow-up visits as told by your health care provider. This is important. Where to find more information  American Heart Association: www.heart.org Summary  Follow the action plan that was created by you and your health care provider.  Get help right away if you have any symptoms in the Red zone. This information is not intended to replace advice given to you by your health care provider. Make sure you discuss any questions you have with your health care provider.

## 2019-11-06 LAB — BASIC METABOLIC PANEL
BUN/Creatinine Ratio: 11 (ref 10–24)
BUN: 15 mg/dL (ref 8–27)
CO2: 19 mmol/L — ABNORMAL LOW (ref 20–29)
Calcium: 9.6 mg/dL (ref 8.6–10.2)
Chloride: 108 mmol/L — ABNORMAL HIGH (ref 96–106)
Creatinine, Ser: 1.41 mg/dL — ABNORMAL HIGH (ref 0.76–1.27)
GFR calc Af Amer: 58 mL/min/{1.73_m2} — ABNORMAL LOW (ref >59)
GFR calc non Af Amer: 50 mL/min/{1.73_m2} — ABNORMAL LOW (ref >59)
Glucose: 98 mg/dL (ref 65–99)
Potassium: 3.9 mmol/L (ref 3.5–5.2)
Sodium: 145 mmol/L — ABNORMAL HIGH (ref 134–144)

## 2019-11-12 ENCOUNTER — Telehealth: Payer: Self-pay

## 2019-11-12 NOTE — Telephone Encounter (Signed)
TRIED TO CALL-MOBILE VM IS FULL HM NUMBER JUST KEEPS RINGING. CHANGED APPT TO VV TRIED TO CALL DAUGHTER [place], UNABLE TO LMVM IT IS FULL WILL CALL AGAIN IN THE AM

## 2019-11-12 NOTE — Telephone Encounter (Signed)
Patient returned call and accepted a virtual appointment tomorrow 2/18.

## 2019-11-12 NOTE — Progress Notes (Signed)
Virtual Visit via Telephone Note   This visit type was conducted due to national recommendations for restrictions regarding the COVID-19 Pandemic (e.g. social distancing) in an effort to limit this patient's exposure and mitigate transmission in our community.  Due to his co-morbid illnesses, this patient is at least at moderate risk for complications without adequate follow up.  This format is felt to be most appropriate for this patient at this time.  The patient did not have access to video technology/had technical difficulties with video requiring transitioning to audio format only (telephone).  All issues noted in this document were discussed and addressed.  No physical exam could be performed with this format.  Please refer to the patient's chart for his  consent to telehealth for [facility].  Evaluation Performed:  Follow-up visit  This visit type was conducted due to national recommendations for restrictions regarding the COVID-19 Pandemic (e.g. social distancing).  This format is felt to be most appropriate for this patient at this time.  All issues noted in this document were discussed and addressed.  No physical exam was performed (except for noted visual exam findings with Video Visits).  Please refer to the patient's chart (MyChart message for video visits and phone note for telephone visits) for the patient's consent to telehealth for [facility]  Date:  11/13/2019   ID:  Dean Bones Sr., DOB 11/08/1950, MRN [id number]  Patient Location:  [name] [address]   Provider location:     [address] Office [phone] Fax [phone]   PCP:  [name], MD  Cardiologist:  Pixie Casino, MD  Electrophysiologist:  [name], MD   Chief Complaint: Follow-up  History of Present Illness:    Dean Opalewski Sr. is a 69 y.o. male who presents via audio/video conferencing for  a telehealth visit today.  Patient verified DOB and address.  The patient does not symptoms concerning for COVID-19 infection (fever, chills, cough, or new SHORTNESS OF BREATH).   Mr. Gordan has a past medical history of coronary artery disease, ischemic cardiomyopathy, PAF, left bundle branch block, history of VT/V. fib arrest status post AICD, HTN, HLD, gout, obstructive sleep apnea on CPAP, diabetes mellitus, CKD 3. Acute on chronic CHF, LVEF 30-35%. His dry weight is somewhere around 245-250 pounds.  He was last seen by Dr. Debara Pickett on 10/20/2019. During that time he was noted to have diarrhea andhe indicated that hewasinstructed to hold his furosemide.He had been off his furosemide for about 1 week left and his weight was increasing at that time. He denied increasing symptoms of heart failure. His device check during that time showed no significant events on his ICD.   He presented to the emergency department on 10/23/2019-10/26/2019 with complaints of intermittent diarrhea lasting for 2 weeks. He has been taking over-the-counter medication including Pepto-Bismol Imodium. At that time he complains of no abdominal pain, nausea, or vomiting. No fevers or chills. No recent use of antibiotics and no sick contacts. He denied chest pain, cough, and shortness of breath. His potassium was 7.3 and his EKG showed PR prolongation widening QRS complexes. His creatinine at that time was 3.8 with a baseline creatinine of 1.2. He received a normal saline bolus 250 mL normal saline and his repeat potassium was 6.7. At discharge his potassium was 5.2, he was following with nephrology and has been prescribed Lokelma. He was instructed to follow-up with nephrology. His BUN/creatinine  had decreased to 41/2.10.  Patient was noted to have an increase in his OptiVol fluid index accumulation. He was prescribed furosemide 80 mg twice daily with potassium 40 mEq daily.  He presented to the clinic  11/05/2019 and stated he felt fairly well.  He had not noticed a large amount of fluid accumulation or lower extremity edema since being discharged from the hospital on 10/26/2019.  His activity level was slowly returning to normal.  He stated he was trying to stay away from salt in his diet.  Over the summer 2020 he stated he did not have any lower extremity edema and felt very well on his medication regimen.  I ordered a BMP .  Had him continue with 80 mg of Lasix for the next 2 days along with 40 mEq of potassium and then changed to 40 mg of Lasix with 20 mill equivalents of potassium.   He is seen virtually today and states he is feeling better since increasing his Lasix dosing.  He states that he has had one episode of increased work of breathing.  Continues to be compliant with CPAP use but has not contacted the provider for updating his machine/appliance.  He states he has had 2 episodes of anxiety last week that he believes are related to not being as physically active.  His weight is decreased from 260 pounds to 247 pounds.  Heart healthy low-sodium diet was discussed with daily weights.  I will change his furosemide to 40 mg as needed, order a BMP, give him the mindfulness relaxation sheet, and have him follow-up in 1 month.  He denies chest pain, shortness of breath, lower extremity edema, fatigue, palpitations, melena, hematuria, hemoptysis, diaphoresis, weakness, presyncope, syncope, orthopnea, and PND.    Prior CV studies:   The following studies were reviewed today:  sinus rhythm with first-degree AV block left bundle branch block 83 bpm- No acute changes  EKG 10/23/2019 Sinus rhythm prolonged PR interval nonspecific IVCD with LAD 72 bpm  Echocardiogram, 04/07/2019 IMPRESSIONS    1. The left ventricle has moderate-severely reduced systolic function,  with an ejection fraction of 30-35%. The cavity size was normal. Left  ventricular diastolic Doppler parameters are consistent with  impaired  relaxation. Left ventricular diffuse  hypokinesis.  2. The right ventricle has normal systolic function. The cavity was  normal. There is no increase in right ventricular wall thickness.  3. Left atrial size was severely dilated.  4. Right atrial size was mildly dilated.  5. The mitral valve is degenerative. Mild calcification of the mitral  valve leaflet. Mitral valve regurgitation is moderate by color flow  Doppler.  6. The aortic valve is tricuspid. Mild calcification of the aortic valve.  Aortic valve regurgitation is trivial by color flow Doppler. No stenosis  of the aortic valve.  7. There is mild dilatation of the aortic root measuring 37 mm.  8. The inferior vena cava was dilated in size with >50% respiratory  variability. PA systolic pressure 41 mmHg .   Past Medical History:  Diagnosis Date  . 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 Arthritis    "maybe in his spine" (09/05/2016)  . Asthma    pt. denies  . Cardiac arrest ([place]) 08/27/2016   REQUIRING CPR, SHOCK & MEDICATIONS  . CHF (congestive heart failure) ([place])   . Coronary artery disease    90% ostRCA stenosis and total occlusion of dRCA with L-->R collaterals 2017  . Dysrhythmia   . Gout   .  High cholesterol   . Hypertension   . Ischemic cardiomyopathy   . LBBB (left bundle branch block)   . OSA on CPAP   . Type II diabetes mellitus ([place])    Past Surgical History:  Procedure Laterality Date  . CARDIAC CATHETERIZATION N/A 09/07/2016   Procedure: Left Heart Cath and Coronary Angiography;  Surgeon: Peter M Martinique, MD;  Location: [facility];  Service: Cardiovascular;  Laterality: N/A;  . COLONOSCOPY WITH PROPOFOL N/A 08/30/2018   Procedure: COLONOSCOPY WITH PROPOFOL;  Surgeon: Carol Ada, MD;  Location: [facility];  Service: Endoscopy;  Laterality: N/A;  . EP IMPLANTABLE DEVICE N/A 10/10/2016   Procedure: ICD Implant;  Surgeon: Will Meredith Leeds, MD;   Location: [place] [facility];  Service: Cardiovascular;  Laterality: N/A;  . LUMBAR LAMINECTOMY/DECOMPRESSION MICRODISCECTOMY N/A 01/18/2018   Procedure: L2-3, L3-4, L-4-5 CENTRAL DECOMPRESSION;  Surgeon: [name], MD;  Location: [facility];  Service: Orthopedics;  Laterality: N/A;  . POLYPECTOMY  08/30/2018   Procedure: POLYPECTOMY;  Surgeon: Carol Ada, MD;  Location: [facility];  Service: Endoscopy;;     Current Meds  Medication Sig  . allopurinol (ZYLOPRIM) 300 MG tablet Take 300 mg by mouth daily.  [name] amiodarone (PACERONE) 100 MG tablet Take 1 tablet (100 mg total) by mouth daily.  [name] atorvastatin (LIPITOR) 40 MG tablet Take 1 tablet (40 mg total) by mouth daily at 6 PM.  . carvedilol (COREG) 25 MG tablet Take 1 tablet (25 mg total) by mouth 2 (two) times daily with a meal.  . CVS VITAMIN D3 1000 units capsule Take 1,000 Units by mouth daily.  [name] ELIQUIS 5 MG TABS tablet TAKE 1 TABLET BY MOUTH TWICE A DAY  . furosemide (LASIX) 40 MG tablet Take 2 tablets (80 mg total) by mouth daily. 80MG  X2DAYS THEN TAKE 40MG  DAILY (Patient taking differently: Take 40 mg by mouth daily. )  . hydrOXYzine (ATARAX/VISTARIL) 25 MG tablet Take 1 tablet (25 mg total) by mouth 3 (three) times daily as needed for anxiety.  . magnesium oxide (MAG-OX) 400 MG tablet Take 200 mg by mouth 2 (two) times daily.  . pantoprazole (PROTONIX) 40 MG tablet Take 1 tablet (40 mg total) by mouth daily.  . potassium chloride SA (KLOR-CON M20) 20 MEQ tablet Take 1 tablet (20 mEq total) by mouth daily.  . sodium bicarbonate 650 MG tablet Take 1 tablet (650 mg total) by mouth 3 (three) times daily.  [name] ZINC-VITAMIN C PO Take 1 tablet by mouth daily.     Allergies:   Contrast media [iodinated diagnostic agents] and Lisinopril   Social History   Tobacco Use  . Smoking status: Never Smoker  . Smokeless tobacco: Never Used  Substance Use Topics  . Alcohol use: No  . Drug use: No     Family Hx: The patient's family history  includes Cancer in his brother; Heart attack in his father; Hypertension in his sister and sister.  ROS:   Please see the history of present illness.     All other systems reviewed and are negative.   Labs/Other Tests and Data Reviewed:    Recent Labs: 06/04/2019: B Natriuretic Peptide 400.0; TSH 1.348 10/26/2019: ALT 27; Hemoglobin 10.4; Magnesium 2.0; Platelets 161 11/05/2019: BUN 15; Creatinine, Ser 1.41; Potassium 3.9; Sodium 145   Recent Lipid Panel Lab Results  Component Value Date/Time   CHOL 159 09/07/2016 08:44 AM   TRIG 146 09/07/2016 08:44 AM   HDL 25 (L) 09/07/2016 08:44 AM  CHOLHDL 6.4 09/07/2016 08:44 AM   LDLCALC 105 (H) 09/07/2016 08:44 AM    Wt Readings from Last 3 Encounters:  11/13/19 247 lb (112 kg)  11/05/19 260 lb (117.9 kg)  10/20/19 252 lb 3.2 oz (114.4 kg)     Exam:    Vital Signs:  BP (!) 152/62   Pulse 60   Ht 5\' 6"  (1.676 m)   Wt 247 lb (112 kg)   BMI 39.87 kg/m    Well nourished, well developed male in no  acute distress.   ASSESSMENT & PLAN:    1.  Acute on chronic systolic CHF-LVEF 99991111. Weight today 247 from 260 pounds at last visit. OptiVol thoracic impedance on 11/13/2019 showed fluid accumulation.  Change furosemide to 40 mg as needed for edema and weight gain of 3 pounds overnight or 5 pounds week. Heart healthy low-sodium diet-salty 6 given Increase physical activity as tolerated Daily weights Ordered BMP    Paroxysmal atrial fibrillation-heart rate today 60 bpm.Noted to be in sinus rhythm/sinus bradycardia on twoprevious device downloads. Continue Eliquis 5 mg twice daily Continue amiodarone100 mg daily-may d/c in 4/21 if no A.fib per Dr. Debara Pickett Continue carvedilol 25 mg twice daily Avoid triggers caffeine, chocolate, EtOH, OSA, etc. Heart healthy low-sodium diet Increase physical activity as tolerated Order BMP  Essential hypertension-BP today  152/60.    Educated on when to take blood pressure.  Blood  pressures been somewhat labile however, this is related to when he takes his medication and his blood pressure.  Blood pressure ranging from 115-130s over 50s-60s. Continue carvedilol 25 mg twice daily Start furosemide 40 mg as needed (dry weight 245-247) for a weight gain of 3 pounds overnight or 5 pounds in a week. Heart healthy low-sodium diet-salty 6 given Increase physical activity as tolerated Continue blood pressure log  Hyperlipidemia-LDL105 09/07/2016 Continue atorvastatin 40 mg daily Heart healthy low-sodium high-fiber diet Increase physical activity as tolerated   Obstructive sleep apnea- CPAP compliant.  States his device is getting old.  Still working but intermittent at times.    Again discuss contacting his CPAP supplier. Continue CPAP use Daily weights  History of VT/VF arrest-status post AICD. Monitored by EP  Anxiety-has had 2 episodes of anxiety over the past week.  Felt it was due to being less active over the last month/month one half. Give mindfulness stress reduction sheet Recommended follow-up with PCP  COVID-19 Education: The signs and symptoms of COVID-19 were discussed with the patient and how to seek care for testing (follow up with PCP or arrange E-visit).  The importance of social distancing was discussed today.  Patient Risk:   After full review of this patients clinical status, I feel that they are at least moderate risk at this time.  Time:   Today, I have spent 21 minutes with the patient with telehealth technology discussing heart failure, diet, daily weights, blood pressure, medication, exercise and anxiety.     Medication Adjustments/Labs and Tests Ordered: Current medicines are reviewed at length with the patient today.  Concerns regarding medicines are outlined above.   Tests Ordered: No orders of the defined types were placed in this encounter.  Medication Changes: No orders of the defined types were placed in this  encounter.   Disposition:  in 1 month(s)  Signed, Jossie Ng. [facility] [address] Office [phone] Fax [phone]

## 2019-11-13 ENCOUNTER — Telehealth (INDEPENDENT_AMBULATORY_CARE_PROVIDER_SITE_OTHER): Payer: Medicare (Managed Care) | Admitting: General Practice

## 2019-11-13 VITALS — BP 152/62 | HR 60 | Ht 66.0 in | Wt 247.0 lb

## 2019-11-13 DIAGNOSIS — I5043 Acute on chronic combined systolic (congestive) and diastolic (congestive) heart failure: Secondary | ICD-10-CM

## 2019-11-13 DIAGNOSIS — Z95810 Presence of automatic (implantable) cardiac defibrillator: Secondary | ICD-10-CM

## 2019-11-13 DIAGNOSIS — I10 Essential (primary) hypertension: Secondary | ICD-10-CM

## 2019-11-13 DIAGNOSIS — I110 Hypertensive heart disease with heart failure: Secondary | ICD-10-CM

## 2019-11-13 DIAGNOSIS — I5023 Acute on chronic systolic (congestive) heart failure: Secondary | ICD-10-CM | POA: Diagnosis not present

## 2019-11-13 DIAGNOSIS — I472 Ventricular tachycardia, unspecified: Secondary | ICD-10-CM

## 2019-11-13 DIAGNOSIS — F419 Anxiety disorder, unspecified: Secondary | ICD-10-CM

## 2019-11-13 DIAGNOSIS — Z9989 Dependence on other enabling machines and devices: Secondary | ICD-10-CM

## 2019-11-13 DIAGNOSIS — I480 Paroxysmal atrial fibrillation: Secondary | ICD-10-CM

## 2019-11-13 DIAGNOSIS — G4733 Obstructive sleep apnea (adult) (pediatric): Secondary | ICD-10-CM

## 2019-11-13 DIAGNOSIS — Z8674 Personal history of sudden cardiac arrest: Secondary | ICD-10-CM

## 2019-11-13 DIAGNOSIS — Z79899 Other long term (current) drug therapy: Secondary | ICD-10-CM

## 2019-11-13 DIAGNOSIS — E785 Hyperlipidemia, unspecified: Secondary | ICD-10-CM | POA: Diagnosis not present

## 2019-11-13 MED ORDER — FUROSEMIDE 40 MG PO TABS: 40.0000 mg | ORAL_TABLET | Freq: Every day | ORAL | 6 refills | Status: DC | PRN

## 2019-11-13 NOTE — Patient Instructions (Addendum)
Medication Instructions:  FUROSEMIDE 40MG  AS NEEDED FOR WEIGHT GAIN >3# IN A DAY OF > 5# IN A WEEK GOAL WEIGHT 247-245 pounds.  Stop potassium  If you need a refill on your cardiac medications before your next appointment, please call your pharmacy.  Labwork: BMET MONDAY 11-17-19 HERE IN OUR OFFICE AT [facility]  You will NOT need to fast   If you have labs (blood work) drawn today and your tests are completely normal, you will receive your results only by: [name] MyChart Message (if you have MyChart) OR A paper copy in the mail If you have any lab test that is abnormal or we need to change your treatment, we will call you to review these results. You may go to any [facility] lab that is convenient for you however, we do have a lab in our office that is able to assist you. You do NOT need an appointment for our lab. Once in our office in our office lobby there is a podium where you sign-in and ring the doorbell to alert [place] that you are here. Lab is open 8:00am and closes at 4:00pm; closes for lunch from 12:45 - 1:45pm. PLEASE BRING A COPY OF YOUR INSURANCE CARD WITH YOU.  Special Instructions: PLEASE READ AND FOLLOW MINDFULNESS ATTACHMENT  PLEASE READ AND FOLLOW SALTY 6 ATTACHED  Reduce your risk of getting COVID-19 With your heart disease it is especially important for people at increased risk of severe illness from COVID-19, and those who live with them, to protect themselves from getting COVID-19. The best way to protect yourself and to help reduce the spread of the virus that causes COVID-19 is to: [name] Limit your interactions with other people as much as possible. . Take precautions to prevent getting COVID-19 when you do interact with others. If you start feeling sick and think you may have COVID-19, get in touch with your healthcare provider within 24 hours.  Follow-Up: 1 MONTH In Person WITH JESSE Hilliard, FNP 12-11-2019 AT 145PM. PLEASE ARRIVE EARLY ALONE AND WEARING A MASK, THANK YOU.   At  [facility], you and your health needs are our priority.  As part of our continuing mission to provide you with exceptional heart care, we have created designated Provider Care Teams.  These Care Teams include your primary Cardiologist (physician) and Advanced Practice Providers (APPs -  Physician Assistants and Nurse Practitioners) who all work together to provide you with the care you need, when you need it.  Thank you for choosing [facility] at [facility]!!        Mindfulness-Based Stress Reduction Mindfulness-based stress reduction (MBSR) is a program that helps people learn to practice mindfulness. Mindfulness is the practice of intentionally paying attention to the present moment. It can be learned and practiced through techniques such as education, breathing exercises, meditation, and yoga. MBSR includes several mindfulness techniques in one program. MBSR works best when you understand the treatment, are willing to try new things, and can commit to spending time practicing what you learn. MBSR training may include learning about:  How your emotions, thoughts, and reactions affect your body.  New ways to respond to things that cause negative thoughts to start (triggers).  How to notice your thoughts and let go of them.  Practicing awareness of everyday things that you normally do without thinking.  The techniques and goals of different types of meditation. What are the benefits of MBSR? MBSR can have many benefits, which include helping you to:  Develop self-awareness.  This refers to knowing and understanding yourself.  Learn skills and attitudes that help you to participate in your own health care.  Learn new ways to care for yourself.  Be more accepting about how things are, and let things go.  Be less judgmental and approach things with an open mind.  Be patient with yourself and trust yourself more. MBSR has also been shown to:  Reduce negative emotions, such as  depression and anxiety.  Improve memory and focus.  Change how you sense and approach pain.  Boost your body's ability to fight infections.  Help you connect better with other people.  Improve your sense of well-being. Follow these instructions at home:   Find a local in-person or online MBSR program.  Set aside some time regularly for mindfulness practice.  Find a mindfulness practice that works best for you. This may include one or more of the following: ? Meditation. Meditation involves focusing your mind on a certain thought or activity. ? Breathing awareness exercises. These help you to stay present by focusing on your breath. ? Body scan. For this practice, you lie down and pay attention to each part of your body from head to toe. You can identify tension and soreness and intentionally relax parts of your body. ? Yoga. Yoga involves stretching and breathing, and it can improve your ability to move and be flexible. It can also provide an experience of testing your body's limits, which can help you release stress. ? Mindful eating. This way of eating involves focusing on the taste, texture, color, and smell of each bite of food. Because this slows down eating and helps you feel full sooner, it can be an important part of a weight-loss plan.  Find a podcast or recording that provides guidance for breathing awareness, body scan, or meditation exercises. You can listen to these any time when you have a free moment to rest without distractions.  Follow your treatment plan as told by your health care provider. This may include taking regular medicines and making changes to your diet or lifestyle as recommended. How to practice mindfulness To do a basic awareness exercise:  Find a comfortable place to sit.  Pay attention to the present moment. Observe your thoughts, feelings, and surroundings just as they are.  Avoid placing judgment on yourself, your feelings, or your surroundings.  Make note of any judgment that comes up, and let it go.  Your mind may wander, and that is okay. Make note of when your thoughts drift, and return your attention to the present moment. To do basic mindfulness meditation:  Find a comfortable place to sit. This may include a stable chair or a firm floor cushion. ? Sit upright with your back straight. Let your arms fall next to your side with your hands resting on your legs. ? If sitting in a chair, rest your feet flat on the floor. ? If sitting on a cushion, cross your legs in front of you.  Keep your head in a neutral position with your chin dropped slightly. Relax your jaw and rest the tip of your tongue on the roof of your mouth. Drop your gaze to the floor. You can close your eyes if you like.  Breathe normally and pay attention to your breath. Feel the air moving in and out of your nose. Feel your belly expanding and relaxing with each breath.  Your mind may wander, and that is okay. Make note of when your thoughts drift, and return  your attention to your breath.  Avoid placing judgment on yourself, your feelings, or your surroundings. Make note of any judgment or feelings that come up, let them go, and bring your attention back to your breath.  When you are ready, lift your gaze or open your eyes. Pay attention to how your body feels after the meditation. Where to find more information You can find more information about MBSR from:  Your health care provider.  Community-based meditation centers or programs.  Programs offered near you. Summary  Mindfulness-based stress reduction (MBSR) is a program that teaches you how to intentionally pay attention to the present moment. It is used with other treatments to help you cope better with daily stress, emotions, and pain.  MBSR focuses on developing self-awareness, which allows you to respond to life stress without judgment or negative emotions.  MBSR programs may involve learning  different mindfulness practices, such as breathing exercises, meditation, yoga, body scan, or mindful eating. Find a mindfulness practice that works best for you, and set aside time for it on a regular basis. This information is not intended to replace advice given to you by your health care provider. Make sure you discuss any questions you have with your health care provider.

## 2019-11-15 ENCOUNTER — Emergency Department (HOSPITAL_COMMUNITY): Payer: Medicare (Managed Care)

## 2019-11-15 ENCOUNTER — Emergency Department (HOSPITAL_COMMUNITY)
Admission: EM | Admit: 2019-11-15 | Discharge: 2019-11-15 | Disposition: A | Discharge status: Home / Self Care | Payer: Medicare (Managed Care) | Attending: Emergency Medicine | Admitting: Emergency Medicine

## 2019-11-15 ENCOUNTER — Other Ambulatory Visit: Payer: Self-pay

## 2019-11-15 ENCOUNTER — Encounter (HOSPITAL_COMMUNITY): Payer: Self-pay | Admitting: Emergency Medicine

## 2019-11-15 DIAGNOSIS — Z95810 Presence of automatic (implantable) cardiac defibrillator: Secondary | ICD-10-CM | POA: Diagnosis not present

## 2019-11-15 DIAGNOSIS — J45909 Unspecified asthma, uncomplicated: Secondary | ICD-10-CM | POA: Diagnosis not present

## 2019-11-15 DIAGNOSIS — I509 Heart failure, unspecified: Secondary | ICD-10-CM | POA: Diagnosis not present

## 2019-11-15 DIAGNOSIS — R0602 Shortness of breath: Secondary | ICD-10-CM | POA: Diagnosis present

## 2019-11-15 DIAGNOSIS — E119 Type 2 diabetes mellitus without complications: Secondary | ICD-10-CM | POA: Insufficient documentation

## 2019-11-15 DIAGNOSIS — I110 Hypertensive heart disease with heart failure: Secondary | ICD-10-CM | POA: Insufficient documentation

## 2019-11-15 DIAGNOSIS — I2510 Atherosclerotic heart disease of native coronary artery without angina pectoris: Secondary | ICD-10-CM | POA: Insufficient documentation

## 2019-11-15 LAB — POCT I-STAT EG7
Bicarbonate: 24.1 mmol/L (ref 20.0–28.0)
Calcium, Ion: 1.21 mmol/L (ref 1.15–1.40)
HCT: 33 % — ABNORMAL LOW (ref 39.0–52.0)
Hemoglobin: 11.2 g/dL — ABNORMAL LOW (ref 13.0–17.0)
O2 Saturation: 99 %
Potassium: 3.8 mmol/L (ref 3.5–5.1)
Sodium: 139 mmol/L (ref 135–145)
TCO2: 25 mmol/L (ref 22–32)
pCO2, Ven: 36.3 mmHg — ABNORMAL LOW (ref 44.0–60.0)
pH, Ven: 7.431 — ABNORMAL HIGH (ref 7.250–7.430)
pO2, Ven: 126 mmHg — ABNORMAL HIGH (ref 32.0–45.0)

## 2019-11-15 LAB — BASIC METABOLIC PANEL
Anion gap: 8 (ref 5–15)
BUN: 11 mg/dL (ref 8–23)
CO2: 23 mmol/L (ref 22–32)
Calcium: 9.4 mg/dL (ref 8.9–10.3)
Chloride: 108 mmol/L (ref 98–111)
Creatinine, Ser: 1.23 mg/dL (ref 0.61–1.24)
GFR calc Af Amer: >60 mL/min (ref >60)
GFR calc non Af Amer: 59 mL/min — ABNORMAL LOW (ref >60)
Glucose, Bld: 102 mg/dL — ABNORMAL HIGH (ref 70–99)
Potassium: 4.1 mmol/L (ref 3.5–5.1)
Sodium: 139 mmol/L (ref 135–145)

## 2019-11-15 LAB — CBC
HCT: 34.9 % — ABNORMAL LOW (ref 39.0–52.0)
Hemoglobin: 10.5 g/dL — ABNORMAL LOW (ref 13.0–17.0)
MCH: 28.6 pg (ref 26.0–34.0)
MCHC: 30.1 g/dL (ref 30.0–36.0)
MCV: 95.1 fL (ref 80.0–100.0)
Platelets: 245 10*3/uL (ref 150–400)
RBC: 3.67 MIL/uL — ABNORMAL LOW (ref 4.22–5.81)
RDW: 14.6 % (ref 11.5–15.5)
WBC: 9 10*3/uL (ref 4.0–10.5)
nRBC: 0 % (ref 0.0–0.2)

## 2019-11-15 LAB — TROPONIN I (HIGH SENSITIVITY)
Troponin I (High Sensitivity): 14 ng/L (ref <18)
Troponin I (High Sensitivity): 10 ng/L (ref <18)

## 2019-11-15 LAB — BRAIN NATRIURETIC PEPTIDE: B Natriuretic Peptide: 560.6 pg/mL — ABNORMAL HIGH (ref 0.0–100.0)

## 2019-11-15 MED ORDER — SODIUM CHLORIDE 0.9% FLUSH: 3.0000 mL | Freq: Once | INTRAVENOUS | Status: DC

## 2019-11-15 MED ORDER — FUROSEMIDE 10 MG/ML IJ SOLN
40.0000 mg | Freq: Once | INTRAMUSCULAR | Status: AC
  Administered 2019-11-15: 40 mg via INTRAVENOUS
  Filled 2019-11-15: qty 4

## 2019-11-15 NOTE — ED Provider Notes (Signed)
[facility] Emergency Department Provider Note MRN:  [id number]  Arrival date & time: 11/15/19     Chief Complaint   Shortness of Breath and Weakness   History of Present Illness   Dean Bihl Sr. is a 69 y.o. year-old male with a history of CHF, diabetes, CAD presenting to the ED with chief complaint of shortness of breath.  Increased shortness of breath and weakness today, gradual onset, feels similar to when he had high potassium back in January.  Explains that the potassium elevation might have been related to him taking potassium pills, which she has stopped.  Denies recent fever or cough, no cold-like symptoms, no chest pain, no abdominal pain, no vomiting, no diarrhea, no numbness or weakness.  Review of Systems  A complete 10 system review of systems was obtained and all systems are negative except as noted in the HPI and PMH.   Patient's Health History    Past Medical History:  Diagnosis Date  . 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 Arthritis    "maybe in his spine" (09/05/2016)  . Asthma    pt. denies  . Cardiac arrest ([place]) 08/27/2016   REQUIRING CPR, SHOCK & MEDICATIONS  . CHF (congestive heart failure) ([employer])   . Coronary artery disease    90% ostRCA stenosis and total occlusion of dRCA with L-->R collaterals 2017  . Dysrhythmia   . Gout   . High cholesterol   . Hypertension   . Ischemic cardiomyopathy   . LBBB (left bundle branch block)   . OSA on CPAP   . Type II diabetes mellitus ([place])     Past Surgical History:  Procedure Laterality Date  . CARDIAC CATHETERIZATION N/A 09/07/2016   Procedure: Left Heart Cath and Coronary Angiography;  Surgeon: Peter M Martinique, MD;  Location: [place] [facility];  Service: Cardiovascular;  Laterality: N/A;  . COLONOSCOPY WITH PROPOFOL N/A 08/30/2018   Procedure: COLONOSCOPY WITH PROPOFOL;  Surgeon: Carol Ada, MD;  Location: [facility];  Service: Endoscopy;   Laterality: N/A;  . EP IMPLANTABLE DEVICE N/A 10/10/2016   Procedure: ICD Implant;  Surgeon: Will Meredith Leeds, MD;  Location: [place] [facility];  Service: Cardiovascular;  Laterality: N/A;  . LUMBAR LAMINECTOMY/DECOMPRESSION MICRODISCECTOMY N/A 01/18/2018   Procedure: L2-3, L3-4, L-4-5 CENTRAL DECOMPRESSION;  Surgeon: [name], MD;  Location: [place];  Service: Orthopedics;  Laterality: N/A;  . POLYPECTOMY  08/30/2018   Procedure: POLYPECTOMY;  Surgeon: Carol Ada, MD;  Location: [facility];  Service: Endoscopy;;    Family History  Problem Relation Age of Onset  . Heart attack Father        died in his 60's  . Hypertension Sister   . Cancer Brother        uncertain, "leg"  . Hypertension Sister     Social History   Socioeconomic History  . Marital status: Widowed    Spouse name: Not on file  . Number of children: Not on file  . Years of education: Not on file  . Highest education level: Not on file  Occupational History  . Not on file  Tobacco Use  . Smoking status: Never Smoker  . Smokeless tobacco: Never Used  Substance and Sexual Activity  . Alcohol use: No  . Drug use: No  . Sexual activity: Not on file  Other Topics Concern  . Not on file  Social History Narrative  . Not on file   Social Determinants of Health  Financial Resource Strain:   . Difficulty of Paying Living Expenses: Not on file  Food Insecurity:   . Worried About Charity fundraiser in the Last Year: Not on file  . Ran Out of Food in the Last Year: Not on file  Transportation Needs:   . Lack of Transportation (Medical): Not on file  . Lack of Transportation (Non-Medical): Not on file  Physical Activity:   . Days of Exercise per Week: Not on file  . Minutes of Exercise per Session: Not on file  Stress:   . Feeling of Stress : Not on file  Social Connections:   . Frequency of Communication with Friends and Family: Not on file  . Frequency of Social Gatherings with Friends and Family:  Not on file  . Attends Religious Services: Not on file  . Active Member of Clubs or Organizations: Not on file  . Attends Archivist Meetings: Not on file  . Marital Status: Not on file  Intimate Partner Violence:   . Fear of Current or Ex-Partner: Not on file  . Emotionally Abused: Not on file  . Physically Abused: Not on file  . Sexually Abused: Not on file     Physical Exam   Vitals:   11/15/19 2130 11/15/19 2215  BP: (!) 146/43 (!) 138/45  Pulse: 75 64  Resp: (!) 21 (!) 22  Temp:    SpO2: 98% 98%    CONSTITUTIONAL: Well-appearing, NAD NEURO:  Alert and oriented x 3, no focal deficits EYES:  eyes equal and reactive ENT/NECK:  no LAD, no JVD CARDIO: Regular rate, well-perfused, normal S1 and S2 PULM:  CTAB no wheezing or rhonchi GI/GU:  normal bowel sounds, non-distended, non-tender MSK/SPINE:  No gross deformities, no edema SKIN:  no rash, atraumatic PSYCH:  Appropriate speech and behavior  *Additional and/or pertinent findings included in MDM below  Diagnostic and Interventional Summary    EKG Interpretation  Date/Time:  Saturday November 15 2019 20:28:48 EST Ventricular Rate:  79 PR Interval:    QRS Duration: 142 QT Interval:  427 QTC Calculation: 490 R Axis:   121 Text Interpretation: Sinus rhythm Prolonged PR interval Nonspecific intraventricular conduction delay Probable anterolateral infarct, old Minimal ST depression, inferior leads Confirmed by Gerlene Fee [id number]) on 11/15/2019 9:25:19 PM      Cardiac Monitoring Interpretation:  Labs Reviewed  BASIC METABOLIC PANEL - Abnormal; Notable for the following components:      Result Value   Glucose, Bld 102 (*)    GFR calc non Af Amer 59 (*)    All other components within normal limits  CBC - Abnormal; Notable for the following components:   RBC 3.67 (*)    Hemoglobin 10.5 (*)    HCT 34.9 (*)    All other components within normal limits  BRAIN NATRIURETIC PEPTIDE - Abnormal; Notable for  the following components:   B Natriuretic Peptide 560.6 (*)    All other components within normal limits  POCT I-STAT EG7 - Abnormal; Notable for the following components:   pH, Ven 7.431 (*)    pCO2, Ven 36.3 (*)    pO2, Ven 126.0 (*)    HCT 33.0 (*)    Hemoglobin 11.2 (*)    All other components within normal limits  TROPONIN I (HIGH SENSITIVITY)  TROPONIN I (HIGH SENSITIVITY)    DG Chest 2 View  Final Result      Medications  sodium chloride flush (NS) 0.9 % injection 3 mL (has  no administration in time range)  furosemide (LASIX) injection 40 mg (has no administration in time range)     Procedures  /  Critical Care Procedures  ED Course and Medical Decision Making  I have reviewed the triage vital signs, the nursing notes, and pertinent available records from the EMR.  Pertinent labs & imaging results that were available during my care of the patient were reviewed by me and considered in my medical decision making (see below for details).     Gradual shortness of breath today with generalized weakness, initial concern for possible hyperkalemia given patient's history of the same, however patient's labs are reassuring.  Asymmetric edema on x-ray, awaiting BNP, possibly related to CHF or fluid overloaded state.  Seems to be breathing comfortably here, may be a candidate for discharge.  10:49 PM update: Work-up reveals signs of pulmonary edema on chest x-ray, mildly elevated BNP.  Patient remains comfortable appearing with normal vital signs, no hypoxia, no increased work of breathing.  Upon further questioning, he was recently taken off of Lasix to a as needed status.  Suspect that he needs to be put back on Lasix daily, will follow up with PCP.  Barth Kirks. Sedonia Small, [place] [email or web address]  Final Clinical Impressions(s) / ED Diagnoses     ICD-10-CM   1. Acute on chronic congestive heart failure, unspecified heart  failure type ([place])  I50.9     ED Discharge Orders    None       Discharge Instructions Discussed with and Provided to Patient:     Discharge Instructions     You were evaluated in the Emergency Department and after careful evaluation, we did not find any emergent condition requiring admission or further testing in the hospital.  Your exam/testing today is overall reassuring.  Your symptoms seem to be due to mild amount of fluid on the lungs related to your heart failure.  As discussed, please call your regular doctor first thing Monday morning to discuss your symptoms and see if you need to be put back on your Lasix pill.  Please return to the Emergency Department if you experience any worsening of your condition.  We encourage you to follow up with a primary care provider.  Thank you for allowing [place] to be a part of your care.       Maudie Flakes, MD 11/15/19 2250

## 2019-11-15 NOTE — Discharge Instructions (Addendum)
You were evaluated in the Emergency Department and after careful evaluation, we did not find any emergent condition requiring admission or further testing in the hospital.  Your exam/testing today is overall reassuring.  Your symptoms seem to be due to mild amount of fluid on the lungs related to your heart failure.  As discussed, please call your regular doctor first thing Monday morning to discuss your symptoms and see if you need to be put back on your Lasix pill.  Please return to the Emergency Department if you experience any worsening of your condition.  We encourage you to follow up with a primary care provider.  Thank you for allowing [place] to be a part of your care.

## 2019-11-15 NOTE — ED Notes (Signed)
Patient verbalizes understanding of discharge instructions. Opportunity for questioning and answers were provided. Armband removed by staff, pt discharged from ED.  

## 2019-11-15 NOTE — ED Triage Notes (Signed)
Pt reports increased SOB and weakness.  States he feels the same way he felt last time he was hospitalized for "high potassium."  Reports his providers have "been cutting back on my lasix".  Hx of CHF

## 2019-11-15 NOTE — ED Notes (Signed)
ekg done. Didn't cross over

## 2019-11-26 NOTE — Progress Notes (Signed)
No ICM remote transmission received for 11/10/2019 and next ICM transmission scheduled for 12/08/2019.   

## 2019-12-08 ENCOUNTER — Telehealth: Payer: Self-pay

## 2019-12-08 ENCOUNTER — Ambulatory Visit (INDEPENDENT_AMBULATORY_CARE_PROVIDER_SITE_OTHER): Payer: Medicare (Managed Care)

## 2019-12-08 DIAGNOSIS — I5022 Chronic systolic (congestive) heart failure: Secondary | ICD-10-CM

## 2019-12-08 DIAGNOSIS — Z95810 Presence of automatic (implantable) cardiac defibrillator: Secondary | ICD-10-CM

## 2019-12-08 NOTE — Progress Notes (Signed)
EPIC Encounter for ICM Monitoring  Patient Name: Dean Hainline Sr. is a 69 y.o. male Date: 12/08/2019 Primary Care Physican: [name], MD Primary Cardiologist:Hilty Electrophysiologist:[place] 12/08/2019 Weight: 251 lbs  Spoke with patient and he complains of swelling in feet but has improved over the last few weeks. Breathing and weight are at baseline.  He knows to limit salt intake to 1800-2000 mg and we discussed food options and how to track salt intake.  Also discussed limiting fluid intake to 64 ounces daily.   He reports taking Furosemide 80 mg daily and per PCP instructions added another 40 mg for the past 3 days to equal 120 mg daily.    OptivolThoracic impedancedecreased suggesting possible ongoing fluid accumulation since1/27/2021 but slight improvement in the last 2 days.Fluid index remains > normal threshold.  Prescribed: Furosemide prescription in Epic has several different instructions and patient is not really sure how much he should be taking daily for long term dosage.   Furosemide40 mg take 40 mg daily as needed for weight gain.  Potassium 20 mEq take 1 tablet (20 mEq total) by mouth daily.  Labs: 11/15/2019 Creatinine 1.23, BUN 11, Potassium 4.1, Sodium 139, GFR 59->60 11/05/2019 Creatinine 1.41, BUN 15, Potassium 3.9, Sodium 145, GFR 50-58  10/26/2019 Creatinine2.10, BUN41, Potassium5.2, [id number], [id number] 10/25/2019 Creatinine2.29, BUN45, Potassium4.4, Sodium134, [id number]  10/24/2019 Creatinine2.62, BUN54, Potassium5.3, [id number], [id number]  01/28/2021Creatinine 3.39, BUN60, Potassium6.2, Sodium138, [id number] (7:07AM) 01/28/2021Creatinine 3.82, BUN70, Potassium7.3, Sodium134, [id number] (1:32 AM) A complete set of results can be found in Results Review.  Recommendations:  Advise will send note to Coletta Memos, NP regarding patients report and his question regarding how much Furosemide should he take daily.     Follow-up plan: ICM clinic phone appointment on3/22/2021to recheck fluid levels. 91 day device clinic remote transmission4/03/2020. Office visit with Coletta Memos, NP on 12/11/2019.  Copy of ICM check sent to [facility] and phone note sent to Coletta Memos NP since patient has an office appointment on 3/18.  3 month ICM trend: 12/08/2019    1 Year ICM trend:       Rosalene Billings, RN 12/08/2019 11:15 AM

## 2019-12-08 NOTE — Telephone Encounter (Signed)
Spoke with patient and he complains of swelling in feet but has improved over the last few weeks. Breathing and weight are at baseline.  He knows to limit salt intake to 1800-2000 mg and we discussed food options and how to track salt intake.  Also discussed limiting fluid intake to 64 ounces daily.   He reports taking Furosemide 80 mg daily up until 3 days ago when PCP instructed him to add another 40 mg for the past 3 days to equal 120 mg daily.  He reports urination increases when taking extra Furosemide.  OptivolThoracic impedancedecreased suggesting possible ongoing fluid accumulation since1/27/2021 but slight improvement in the last 2 days.Fluid index remains > normal threshold.  Prescribed: Furosemide prescription in Epic has several different instructions and patient is not really sure how much he should be taking daily for long term dosage.   Furosemide40 mg take 40 mg daily as needed for weight gain.  Potassium 20 mEq take 1 tablet (20 mEq total) by mouth daily.  Labs: 11/15/2019 Creatinine 1.23, BUN 11, Potassium 4.1, Sodium 139, GFR 59->60 11/05/2019 Creatinine 1.41, BUN 15, Potassium 3.9, Sodium 145, GFR 50-58  10/26/2019 Creatinine2.10, BUN41, Potassium5.2, [id number], [id number] 10/25/2019 Creatinine2.29, BUN45, Potassium4.4, Sodium134, [id number]  10/24/2019 Creatinine2.62, BUN54, Potassium5.3, [id number], [id number]  01/28/2021Creatinine 3.39, BUN60, Potassium6.2, Sodium138, [id number] (7:07AM) 01/28/2021Creatinine 3.82, BUN70, Potassium7.3, Sodium134, [id number] (1:32 AM) A complete set of results can be found in Results Review.  Recommendations:  Advise will send note to Coletta Memos, NP regarding patients Optivol device results clarify how much Furosemide patient should take daily.    Follow-up plan: ICM clinic phone appointment on3/22/2021to recheck fluid levels.Office visit with Coletta Memos, NP on 12/11/2019.

## 2019-12-10 NOTE — Progress Notes (Signed)
Cardiology Clinic Note   Patient Name: Dean Reddish Sr. Date of Encounter: 12/11/2019  Primary Care Provider:  [name], MD Primary Cardiologist:  Pixie Casino, MD  Patient Profile    Dean [name] 69 year old male presents today for evaluation of his CHF, CAD, ICM, HTN.  Past Medical History    Past Medical History:  Diagnosis Date  . 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 Arthritis    "maybe in his spine" (09/05/2016)  . Asthma    pt. denies  . Cardiac arrest ([place]) 08/27/2016   REQUIRING CPR, SHOCK & MEDICATIONS  . CHF (congestive heart failure) ([place])   . Coronary artery disease    90% ostRCA stenosis and total occlusion of dRCA with L-->R collaterals 2017  . Dysrhythmia   . Gout   . High cholesterol   . Hypertension   . Ischemic cardiomyopathy   . LBBB (left bundle branch block)   . OSA on CPAP   . Type II diabetes mellitus ([place])    Past Surgical History:  Procedure Laterality Date  . CARDIAC CATHETERIZATION N/A 09/07/2016   Procedure: Left Heart Cath and Coronary Angiography;  Surgeon: Peter M Martinique, MD;  Location: [facility];  Service: Cardiovascular;  Laterality: N/A;  . COLONOSCOPY WITH PROPOFOL N/A 08/30/2018   Procedure: COLONOSCOPY WITH PROPOFOL;  Surgeon: Carol Ada, MD;  Location: [facility];  Service: Endoscopy;  Laterality: N/A;  . EP IMPLANTABLE DEVICE N/A 10/10/2016   Procedure: ICD Implant;  Surgeon: Will Meredith Leeds, MD;  Location: [facility];  Service: Cardiovascular;  Laterality: N/A;  . LUMBAR LAMINECTOMY/DECOMPRESSION MICRODISCECTOMY N/A 01/18/2018   Procedure: L2-3, L3-4, L-4-5 CENTRAL DECOMPRESSION;  Surgeon: [name], MD;  Location: [facility];  Service: Orthopedics;  Laterality: N/A;  . POLYPECTOMY  08/30/2018   Procedure: POLYPECTOMY;  Surgeon: Carol Ada, MD;  Location: [facility];  Service: Endoscopy;;    Allergies  Allergies  Allergen Reactions  . Contrast Media  [Iodinated Diagnostic Agents] Shortness Of Breath and Nausea And Vomiting  . Lisinopril Cough    History of Present Illness    Dean [name] has a past medical history of coronary artery disease, ischemic cardiomyopathy, PAF, left bundle branch block, history of VT/V. fib arrest status post AICD, HTN, HLD, gout, obstructive sleep apnea on CPAP, diabetes mellitus, CKD 3. Acute on chronic CHF, LVEF 30-35%. His dry weight is somewhere around 245-250 pounds.  He was last seen by Dr. Debara Pickett on 10/20/2019. During that time he was noted to have diarrhea andhe indicated that hewasinstructed to hold his furosemide.He had been off his furosemide for about 1 week left and his weight was increasing at that time. He denied increasing symptoms of heart failure. His device check during that time showed no significant events on his ICD.   He presented to the emergency department on 10/23/2019-10/26/2019 with complaints of intermittent diarrhea lasting for 2 weeks. He has been taking over-the-counter medication including Pepto-Bismol Imodium. At that time he complains of no abdominal pain, nausea, or vomiting. No fevers or chills. No recent use of antibiotics and no sick contacts. He denied chest pain, cough, and shortness of breath. His potassium was 7.3 and his EKG showed PR prolongation widening QRS complexes. His creatinine at that time was 3.8 with a baseline creatinine of 1.2. He received a normal saline bolus 250 mL normal saline and his repeat potassium was 6.7. At discharge his potassium was 5.2, he was following with nephrology and has been  prescribed Lokelma. He was instructed to follow-up with nephrology. His BUN/creatinine had decreased to 41/2.10.  Patient was noted to have an increase in his OptiVol fluid index accumulation. He was prescribed furosemide 80 mg twice daily with potassium 40 mEq daily.  He presented to the clinic 11/05/2019 and statedhe felt fairly well. He had not  noticed a large amount of fluid accumulation or lower extremity edema since being discharged from the hospital on 10/26/2019. His activity level was slowly returning to normal. He stated he was trying to stay away from salt in his diet. Over the summer 2020 he stated he did not have any lower extremity edema and felt very well on his medication regimen. I ordered a BMP . Had him continue with 80 mg of Lasix for the next 2 days along with 40 mEq of potassium and then changed to 40 mg of Lasix with 20 mill equivalents of potassium.   He was seen virtually 11/13/19 and stated he was feeling better since increasing his Lasix dosing.  He stated that he  had one episode of increased work of breathing.  Continued to be compliant with CPAP use but had not contacted the provider for updating his machine/appliance.  He stated he  had 2 episodes of anxiety last week that he believes were related to not being as physically active.  His weight was decreased from 260 pounds to 247 pounds.  Heart healthy low-sodium diet was discussed with daily weights.  I changed his furosemide to 40 mg as needed, ordered a BMP, and gave him the mindfulness relaxation sheet.   He presented to the emergency department on 11/15/2019 with complaints of increased shortness of breath and weakness.  He had not been taking his as needed Lasix.  He was given 40 mg of  IV Lasix, his chest x-ray showed pulmonary edema and his BNP was mildly elevated.  His weight at that time was 247 pounds.  He was discharged home.  He presents today for follow-up evaluation and states he is feeling better with increased Lasix dosing.  He states he has fluctuations in his weight and is not sure what happens.  He admits to eating convenience type foods.  He is increase in his activity slowly.  I have also asked him to start wearing lower extremity support stockings.  I have educated him about fast food, processed/packaged food, and salt content.  I will give him  the salty 6 information sheet, continue his Lasix at the current dosing, and have him follow-up with Dr. Debara Pickett as scheduled.  Today hedenies chest pain, shortness of breath, increased lower extremity edema, fatigue, palpitations, melena, hematuria, hemoptysis, diaphoresis, weakness, presyncope, syncope, orthopnea, and PND.  Home Medications    Prior to Admission medications   Medication Sig Start Date End Date Taking? Authorizing Provider  allopurinol (ZYLOPRIM) 300 MG tablet Take 300 mg by mouth daily. 08/16/16   [provider]  amiodarone (PACERONE) 100 MG tablet Take 1 tablet (100 mg total) by mouth daily. 10/20/19   Hilty, Nadean Corwin, MD  atorvastatin (LIPITOR) 40 MG tablet Take 1 tablet (40 mg total) by mouth daily at 6 PM. Patient taking differently: Take 40 mg by mouth daily at 12 noon.  10/27/16   Camnitz, Ocie Doyne, MD  carvedilol (COREG) 25 MG tablet Take 1 tablet (25 mg total) by mouth 2 (two) times daily with a meal. 05/31/18   Duke, Tami Lin, PA  [employer] VITAMIN D3 1000 units capsule Take 1,000 Units by  mouth daily. 08/16/16   [provider]  ELIQUIS 5 MG TABS tablet TAKE 1 TABLET BY MOUTH TWICE A DAY Patient taking differently: Take 5 mg by mouth 2 (two) times daily.  09/09/19   Hilty, Nadean Corwin, MD  furosemide (LASIX) 40 MG tablet Take 1 tablet (40 mg total) by mouth daily as needed (wt gain >3QD>5WEEK). 80MG  X2DAYS THEN TAKE 40MG  DAILY Patient taking differently: Take 20 mg by mouth daily as needed (weight gain 3 lb/day or 5 lb/week).  11/13/19   Deberah Pelton, NP  hydrOXYzine (ATARAX/VISTARIL) 25 MG tablet Take 1 tablet (25 mg total) by mouth 3 (three) times daily as needed for anxiety. Patient not taking: Reported on 11/15/2019 10/26/19   Raiford Noble Latif, DO  magnesium oxide (MAG-OX) 400 MG tablet Take 200 mg by mouth 2 (two) times daily.    [provider]  pantoprazole (PROTONIX) 40 MG tablet Take 1 tablet (40 mg total) by mouth daily. 07/01/18    Duke, Tami Lin, PA  potassium chloride SA (KLOR-CON M20) 20 MEQ tablet Take 1 tablet (20 mEq total) by mouth daily. Patient not taking: Reported on 11/15/2019 11/05/19 02/03/20  Deberah Pelton, NP  sodium bicarbonate 650 MG tablet Take 1 tablet (650 mg total) by mouth 3 (three) times daily. 10/26/19   Sheikh, Omair Latif, DO  valsartan (DIOVAN) 160 MG tablet Take 160 mg by mouth daily.    [provider]  ZINC-VITAMIN C PO Take 1 tablet by mouth daily.    [provider]    Family History    Family History  Problem Relation Age of Onset  . Heart attack Father        died in his 30's  . Hypertension Sister   . Cancer Brother        uncertain, "leg"  . Hypertension Sister    He indicated that his father is deceased. He indicated that both of his sisters are alive. He indicated that his brother is deceased.  Social History    Social History   Socioeconomic History  . Marital status: Widowed    Spouse name: Not on file  . Number of children: Not on file  . Years of education: Not on file  . Highest education level: Not on file  Occupational History  . Not on file  Tobacco Use  . Smoking status: Never Smoker  . Smokeless tobacco: Never Used  Substance and Sexual Activity  . Alcohol use: No  . Drug use: No  . Sexual activity: Not on file  Other Topics Concern  . Not on file  Social History Narrative  . Not on file   Social Determinants of Health   Financial Resource Strain:   . Difficulty of Paying Living Expenses:   Food Insecurity:   . Worried About [name] fundraiser in the Last Year:   . Arboriculturist in the Last Year:   Transportation Needs:   . Film/video editor (Medical):   [name] Lack of Transportation (Non-Medical):   Physical Activity:   . Days of Exercise per Week:   . Minutes of Exercise per Session:   Stress:   . Feeling of Stress :   Social Connections:   . Frequency of Communication with Friends and Family:   . Frequency  of Social Gatherings with Friends and Family:   . Attends Religious Services:   . Active Member of Clubs or Organizations:   . Attends Archivist Meetings:   .  Marital Status:   Intimate Partner Violence:   . Fear of Current or Ex-Partner:   . Emotionally Abused:   [name] Physically Abused:   . Sexually Abused:      Review of Systems    General:  No chills, fever, night sweats or weight changes.  Cardiovascular:  No chest pain, dyspnea on exertion, edema, orthopnea, palpitations, paroxysmal nocturnal dyspnea. Dermatological: No rash, lesions/masses Respiratory: No cough, dyspnea Urologic: No hematuria, dysuria Abdominal:   No nausea, vomiting, diarrhea, bright red blood per rectum, melena, or hematemesis Neurologic:  No visual changes, wkns, changes in mental status. All other systems reviewed and are otherwise negative except as noted above.  Physical Exam    VS:  BP (!) 148/62   Pulse 72   Temp (!) 96.8 F (36 C)   Ht 5\' 6"  (1.676 m)   Wt 254 lb (115.2 kg)   BMI 41.00 kg/m  , BMI Body mass index is 41 kg/m. GEN: Well nourished, well developed, in no acute distress. HEENT: normal. Neck: Supple, no JVD, carotid bruits, or masses. Cardiac: RRR, no murmurs, rubs, or gallops. No clubbing, cyanosis, edema.  Radials/DP/PT 2+ and equal bilaterally.  Respiratory:  Respirations regular and unlabored, clear to auscultation bilaterally. GI: Soft, nontender, nondistended, BS + x 4. MS: no deformity or atrophy. Skin: warm and dry, no rash. Neuro:  Strength and sensation are intact. Psych: Normal affect.  Accessory Clinical Findings    ECG personally reviewed by me today-none today.  EKG 11/13/2019 sinus rhythm with first-degree AV block left bundle branch block 83 bpm- No acute changes  EKG 10/23/2019 Sinus rhythm prolonged PR interval nonspecific IVCD with LAD 72 bpm  Echocardiogram, 04/07/2019 IMPRESSIONS    1. The left ventricle has moderate-severely reduced  systolic function,  with an ejection fraction of 30-35%. The cavity size was normal. Left  ventricular diastolic Doppler parameters are consistent with impaired  relaxation. Left ventricular diffuse  hypokinesis.  2. The right ventricle has normal systolic function. The cavity was  normal. There is no increase in right ventricular wall thickness.  3. Left atrial size was severely dilated.  4. Right atrial size was mildly dilated.  5. The mitral valve is degenerative. Mild calcification of the mitral  valve leaflet. Mitral valve regurgitation is moderate by color flow  Doppler.  6. The aortic valve is tricuspid. Mild calcification of the aortic valve.  Aortic valve regurgitation is trivial by color flow Doppler. No stenosis  of the aortic valve.  7. There is mild dilatation of the aortic root measuring 37 mm.  8. The inferior vena cava was dilated in size with >50% respiratory  variability. PA systolic pressure 41 mmHg .    Assessment & Plan   1.   Acute on chronic systolic CHF-LVEF [id number]. Weight today 254 pounds from247 pounds at last visit. OptiVol thoracic impedance has been labile and not accurate at times. Continue furosemide 80 mg twice daily Heart healthy low-sodium diet-salty 6 given Increase physical activity as tolerated Daily weights-log given Lower extremity support stockings Elevate extremities when not active Order BMP    Paroxysmal atrial fibrillation-heart rate today 72 bpm.Noted to be in sinus rhythm/sinus bradycardia on twoprevious device downloads. Continue Eliquis 5 mg twice daily Continue amiodarone100 mg daily-may d/c in 4/21 if no A.fib per Dr. Debara Pickett Continue carvedilol 25 mg twice daily Avoid triggers caffeine, chocolate, EtOH, OSA, etc. Heart healthy low-sodium diet Increase physical activity as tolerated   Essential hypertension-BP today  148/62.  Educated on when to take blood pressure.  Blood pressures been somewhat labile  however, this is related to when he takes his medication and his blood pressure.  Blood pressure ranging from 115-130s over 50s-60s. Continue carvedilol 25 mg twice daily Start furosemide 40 mg as needed (dry weight 245-247) for a weight gain of 3 pounds overnight or 5 pounds in a week. Heart healthy low-sodium diet-salty 6 given Increase physical activity as tolerated Continue blood pressure log  Hyperlipidemia-LDL105 09/07/2016 Continue atorvastatin 40 mg daily Heart healthy low-sodium high-fiber diet Increase physical activity as tolerated  History of VT/VF arrest-status post AICD. Monitored by EP   Disposition: Follow-up with Dr. Debara Pickett in as scheduled  Jossie Ng. [facility] [address] Office [phone] Fax [phone]

## 2019-12-11 ENCOUNTER — Ambulatory Visit (INDEPENDENT_AMBULATORY_CARE_PROVIDER_SITE_OTHER): Payer: Medicare (Managed Care) | Admitting: General Practice

## 2019-12-11 ENCOUNTER — Other Ambulatory Visit: Payer: Self-pay

## 2019-12-11 ENCOUNTER — Encounter: Payer: Self-pay | Admitting: General Practice

## 2019-12-11 VITALS — BP 148/62 | HR 72 | Temp 96.8°F | Ht 66.0 in | Wt 254.0 lb

## 2019-12-11 DIAGNOSIS — I10 Essential (primary) hypertension: Secondary | ICD-10-CM

## 2019-12-11 DIAGNOSIS — E785 Hyperlipidemia, unspecified: Secondary | ICD-10-CM

## 2019-12-11 DIAGNOSIS — I472 Ventricular tachycardia, unspecified: Secondary | ICD-10-CM

## 2019-12-11 DIAGNOSIS — I5043 Acute on chronic combined systolic (congestive) and diastolic (congestive) heart failure: Secondary | ICD-10-CM | POA: Diagnosis not present

## 2019-12-11 DIAGNOSIS — Z79899 Other long term (current) drug therapy: Secondary | ICD-10-CM

## 2019-12-11 DIAGNOSIS — I480 Paroxysmal atrial fibrillation: Secondary | ICD-10-CM | POA: Diagnosis not present

## 2019-12-11 NOTE — Patient Instructions (Signed)
Medication Instructions:  CONTINUE LASIX 80MG  TWICE DAILY If you need a refill on your cardiac medications before your next appointment, please call your pharmacy.  Labwork: BMET TODAY HERE IN OUR OFFICE AT [facility]    If you have labs (blood work) drawn today and your tests are completely normal, you will receive your results only by: [name] MyChart Message (if you have MyChart) OR A paper copy in the mail If you have any lab test that is abnormal or we need to change your treatment, we will call you to review these results. You may go to any [facility] lab that is convenient for you however, we do have a lab in our office that is able to assist you. You do NOT need an appointment for our lab. Once in our office in our office lobby there is a podium where you sign-in and ring the doorbell to alert [place] that you are here. Lab is open 8:00am and closes at 4:00pm; closes for lunch from 12:45 - 1:45pm. PLEASE BRING A COPY OF YOUR INSURANCE CARD WITH YOU.  Special Instructions: TAKE AND LOG WEIGHT DAILY  TAKE AND LOG BLOOD PRESSURE EVERY-OTHER-DAY  PLEASE PURCHASE AND WEAR COMPRESSION STOCKINGS DAILY AND OFF AT BEDTIME. Compression stockings are elastic socks that squeeze the legs. They help to increase blood flow to the legs and to decrease swelling in the legs from fluid retention, and reduce the chance of developing blood clots in the lower legs. PLEASE ELEVATE YOUR LEGS WHEN SITTING.   PLEASE READ AND FOLLOW SALTY 6 ATTACHED  Follow-Up: KEEP SCHEDULED APPOINTMENT  In Person K. Mali Hilty, MD.    At [facility], you and your health needs are our priority.  As part of our continuing mission to provide you with exceptional heart care, we have created designated Provider Care Teams.  These Care Teams include your primary Cardiologist (physician) and Advanced Practice Providers (APPs -  Physician Assistants and Nurse Practitioners) who all work together to provide you with the care you need, when you  need it.  Reduce your risk of getting COVID-19 With your heart disease it is especially important for people at increased risk of severe illness from COVID-19, and those who live with them, to protect themselves from getting COVID-19. The best way to protect yourself and to help reduce the spread of the virus that causes COVID-19 is to: [name] Limit your interactions with other people as much as possible. . Take COVID-19 when you do interact with others. If you start feeling sick and think you may have COVID-19, get in touch with your healthcare provider within 24 hours.  Thank you for choosing [facility] at [facility]!!

## 2019-12-12 LAB — BASIC METABOLIC PANEL
BUN/Creatinine Ratio: 15 (ref 10–24)
BUN: 21 mg/dL (ref 8–27)
CO2: 23 mmol/L (ref 20–29)
Calcium: 10.3 mg/dL — ABNORMAL HIGH (ref 8.6–10.2)
Chloride: 100 mmol/L (ref 96–106)
Creatinine, Ser: 1.4 mg/dL — ABNORMAL HIGH (ref 0.76–1.27)
GFR calc Af Amer: 58 mL/min/{1.73_m2} — ABNORMAL LOW (ref >59)
GFR calc non Af Amer: 51 mL/min/{1.73_m2} — ABNORMAL LOW (ref >59)
Glucose: 90 mg/dL (ref 65–99)
Potassium: 4.3 mmol/L (ref 3.5–5.2)
Sodium: 143 mmol/L (ref 134–144)

## 2019-12-12 NOTE — Telephone Encounter (Signed)
Patient seen in office by Coletta Memos, NP and recommendations were given at that time.

## 2019-12-12 NOTE — Progress Notes (Signed)
Patient seen in the office on 12/11/2019 by Coletta Memos, NP and following recommendations were given:  Furosemide 80 mg 1 tablet twice a day.  Heart healthy low-sodium diet-salty 6 info given Increase physical activity as tolerated Daily weights-log given Lower extremity support stockings Elevate extremities when not active  12/11/2019 Creatinine 1.40, BUN 21, Potassium 4.3, Sodium 143, GFR 51-58

## 2019-12-15 ENCOUNTER — Ambulatory Visit (INDEPENDENT_AMBULATORY_CARE_PROVIDER_SITE_OTHER): Payer: Medicare (Managed Care)

## 2019-12-15 DIAGNOSIS — I5022 Chronic systolic (congestive) heart failure: Secondary | ICD-10-CM

## 2019-12-15 DIAGNOSIS — Z95810 Presence of automatic (implantable) cardiac defibrillator: Secondary | ICD-10-CM

## 2019-12-15 NOTE — Progress Notes (Signed)
EPIC Encounter for ICM Monitoring  Patient Name: Dean Chisman Sr. is a 69 y.o. male Date: 12/15/2019 Primary Care Physican: [name], MD Primary Cardiologist:Hilty Electrophysiologist:Camnitz 3/23/2021Weight: 251 lbs  Spoke with patient.  He still has a little swelling right foot but symptoms have much improved since taking Furosemide 80 mg bid.  He report Dean Memos, NP instructed him to decrease Furosemide to 80 mg daily if weight drops to 247 lbs. He is weighing daily.  OptivolThoracic impedanceimproved but still suggesting possible ongoing fluid accumulation since1/27/2021.  Fluid index remains > normal threshold.  Prescribed:   Furosemide80 mg take 1 tablet (80 mg total) twice a day (increased on 12/11/19).  Labs: 12/11/2019 Creatinine 1.40, BUN 21, Potassium 4.3, Sodium 143, GFR 51-58 11/15/2019 Creatinine 1.23, BUN 11, Potassium 4.1, Sodium 139, GFR 59->60 11/05/2019 Creatinine 1.41, BUN 15, Potassium 3.9, Sodium 145, GFR 50-58  10/26/2019 Creatinine2.10, BUN41, Potassium5.2, [id number], [id number] 10/25/2019 Creatinine2.29, BUN45, Potassium4.4, [id number], [id number]  10/24/2019 Creatinine2.62, BUN54, Potassium5.3, [id number], [id number]  01/28/2021Creatinine 3.39, BUN60, Potassium6.2, Sodium138, [id number] (7:07AM) 01/28/2021Creatinine 3.82, BUN70, Potassium7.3, Sodium134, [id number] (1:32 AM) A complete set of results can be found in Results Review.  Recommendations: Advised to call if fluid accumulation symptoms return and to limit salt intake.  No changes and encouraged to call if experiencing any fluid symptoms.  Follow-up plan: ICM clinic phone appointment on4/7/2021to recheck fluid levels. 91 day device clinic remote transmission4/03/2020. Office visit with Dr Debara Pickett on 01/23/2020.  Copy of ICM check sent to [facility] and Dr Debara Pickett.  3 month ICM trend: 12/15/2019    1 Year ICM trend:       Dean Billings,  RN 12/15/2019 10:54 AM

## 2019-12-24 ENCOUNTER — Telehealth: Payer: Self-pay

## 2019-12-24 NOTE — Telephone Encounter (Signed)
Returned call to patient as requested by voice mail message. He said he has some intermittent swelling of feet but resolves when he props feet up.  His weight and breathing is at baseline. Advised to use compression stockings as recommended at office visit with Coletta Memos, NP.  Discussed when to call the office regarding fluid symptoms.  No changes today.  Reiterated to limit salt and fluid intake as recommended.

## 2019-12-31 ENCOUNTER — Ambulatory Visit (INDEPENDENT_AMBULATORY_CARE_PROVIDER_SITE_OTHER): Payer: Medicare (Managed Care)

## 2019-12-31 ENCOUNTER — Ambulatory Visit (INDEPENDENT_AMBULATORY_CARE_PROVIDER_SITE_OTHER): Payer: Medicare (Managed Care) | Admitting: *Deleted

## 2019-12-31 DIAGNOSIS — I509 Heart failure, unspecified: Secondary | ICD-10-CM | POA: Diagnosis not present

## 2019-12-31 DIAGNOSIS — Z95810 Presence of automatic (implantable) cardiac defibrillator: Secondary | ICD-10-CM

## 2019-12-31 LAB — CUP PACEART REMOTE DEVICE CHECK
Battery Remaining Longevity: 114 mo
Battery Voltage: 3 V
Brady Statistic RV Percent Paced: 0.02 %
Date Time Interrogation Session: 20210407042207
HighPow Impedance: 64 Ohm
Implantable Lead Implant Date: 20180116
Implantable Lead Location: 753860
Implantable Lead Model: 6935
Implantable Pulse Generator Implant Date: 20180116
Lead Channel Impedance Value: 266 Ohm
Lead Channel Impedance Value: 342 Ohm
Lead Channel Pacing Threshold Amplitude: 0.875 V
Lead Channel Pacing Threshold Pulse Width: 0.4 ms
Lead Channel Sensing Intrinsic Amplitude: 11.75 mV
Lead Channel Sensing Intrinsic Amplitude: 11.75 mV
Lead Channel Setting Pacing Amplitude: 2.5 V
Lead Channel Setting Pacing Pulse Width: 0.4 ms
Lead Channel Setting Sensing Sensitivity: 0.3 mV

## 2019-12-31 NOTE — Progress Notes (Signed)
ICD Remote  

## 2019-12-31 NOTE — Progress Notes (Signed)
Received: Today Message Contents  Hilty, Nadean Corwin, MD  Acxel Dingee Panda, RN  Impedence looks high for months - will reevaluate in office on 4/30. Thanks.

## 2019-12-31 NOTE — Progress Notes (Signed)
Patient called back with today's weight of 255 lbs.  He weighed with his clothes midday.

## 2019-12-31 NOTE — Progress Notes (Signed)
EPIC Encounter for ICM Monitoring  Patient Name: Dean Havard Sr. is a 69 y.o. male Date: 12/31/2019 Primary Care Physican: [name], MD Primary Cardiologist:Hilty Electrophysiologist:Camnitz 3/22/2021Weight: [id number] 12/31/2019 Weights: 255 lbs with clothes  Spoke with patient.  He reports having small amount of swelling in feet more so on the left than right.  He reports breathing is at baseline. He did not have any recent weights.  OptivolThoracic impedancecontinues suggesting possible ongoing fluid accumulation since1/27/2021.  Fluid index remains > normal threshold.  Prescribed:  Furosemide80 mg take1 tablet (80 mg total) twice a day (increased on 12/11/19).Confirmed 4/7 he is taking the correct prescribed Furosemide dosage.  Labs: 12/11/2019 Creatinine 1.40, BUN 21, Potassium 4.3, Sodium 143, GFR 51-58 11/15/2019 Creatinine1.23, BUN11, Potassium4.1, [id number], GFR59->60 11/05/2019 Creatinine1.41, BUN15, Potassium3.9, [id number], GFR50-58 10/26/2019 Creatinine2.10, BUN41, Potassium5.2, [id number], [id number] 10/25/2019 Creatinine2.29, BUN45, Potassium4.4, Sodium134, [id number]  10/24/2019 Creatinine2.62, BUN54, Potassium5.3, [id number], [id number]  01/28/2021Creatinine 3.39, BUN60, Potassium6.2, Sodium138, [id number] (7:07AM) 01/28/2021Creatinine 3.82, BUN70, Potassium7.3, Sodium134, [id number] (1:32 AM) A complete set of results can be found in Results Review.  Recommendations: Reminded to weigh every morning consistently to monitor for fluid weight. Advised to call if lower extremity swelling gets worse or he develops new symptoms.   Follow-up plan: ICM clinic phone appointment on4/21/2021 for 31 day andto recheck fluid levels. 91 day device clinic remote transmission7/03/2020. Office visit with Dr Debara Pickett on 01/23/2020.  Copy of ICM check sent to [place] for review and recommendations if needed.  3 month ICM  trend: 12/31/2019    1 Year ICM trend:       Rosalene Billings, RN 12/31/2019 2:34 PM

## 2020-01-14 ENCOUNTER — Ambulatory Visit (INDEPENDENT_AMBULATORY_CARE_PROVIDER_SITE_OTHER): Payer: Medicare (Managed Care)

## 2020-01-14 DIAGNOSIS — I509 Heart failure, unspecified: Secondary | ICD-10-CM

## 2020-01-14 DIAGNOSIS — Z95810 Presence of automatic (implantable) cardiac defibrillator: Secondary | ICD-10-CM

## 2020-01-16 ENCOUNTER — Telehealth: Payer: Self-pay

## 2020-01-16 NOTE — Telephone Encounter (Signed)
Remote ICM transmission received.  Attempted call to patient regarding ICM remote transmission and mail box is full. 

## 2020-01-16 NOTE — Progress Notes (Signed)
EPIC Encounter for ICM Monitoring  Patient Name: Dean Jelle Sr. is a 69 y.o. male Date: 01/16/2020 Primary Care Physican: [name], MD Primary Cardiologist:Hilty Electrophysiologist:Camnitz 3/22/2021Weight: [id number] 12/31/2019 Weights: 255 lbs with clothes  Attempted call to patient and unable to reach.   Transmission reviewed.   OptivolThoracic impedancecontinues suggesting possible ongoing fluid accumulation since1/27/2021.Fluid index remains > normal threshold.  Prescribed:  Furosemide80 mg take1 tablet (80mg total) twice a day (increased on 12/11/19).Confirmed 4/7 he is taking the correct prescribed Furosemide dosage.  Labs: 12/11/2019 Creatinine 1.40, BUN 21, Potassium 4.3, Sodium 143, GFR 51-58 11/15/2019 Creatinine1.23, BUN11, Potassium4.1, [id number], GFR59->60 11/05/2019 Creatinine1.41, BUN15, Potassium3.9, [id number], GFR50-58 10/26/2019 Creatinine2.10, BUN41, Potassium5.2, [id number], [id number] 10/25/2019 Creatinine2.29, BUN45, Potassium4.4, Sodium134, [id number]  10/24/2019 Creatinine2.62, BUN54, Potassium5.3, [id number], [id number]  01/28/2021Creatinine 3.39, BUN60, Potassium6.2, Sodium138, [id number] (7:07AM) 01/28/2021Creatinine 3.82, BUN70, Potassium7.3, Sodium134, [id number] (1:32 AM) A complete set of results can be found in Results Review.  Recommendations: Unable to reach.    Follow-up plan: ICM clinic phone appointment on5/24/2021. 91 day device clinic remote transmission7/03/2020. [facility].  Copy of ICM check sent to [place] advised will evaluate patient at 4/30 office visit.  3 month ICM trend: 01/14/2020    1 Year ICM trend:       Rosalene Billings, RN 01/16/2020 3:50 PM

## 2020-01-23 ENCOUNTER — Encounter: Payer: Self-pay | Admitting: Internal Medicine

## 2020-01-23 ENCOUNTER — Ambulatory Visit (INDEPENDENT_AMBULATORY_CARE_PROVIDER_SITE_OTHER): Payer: Medicare (Managed Care) | Admitting: Internal Medicine

## 2020-01-23 ENCOUNTER — Other Ambulatory Visit: Payer: Self-pay

## 2020-01-23 VITALS — BP 156/62 | HR 68 | Ht 66.0 in | Wt 251.8 lb

## 2020-01-23 DIAGNOSIS — R0602 Shortness of breath: Secondary | ICD-10-CM

## 2020-01-23 DIAGNOSIS — I5043 Acute on chronic combined systolic (congestive) and diastolic (congestive) heart failure: Secondary | ICD-10-CM | POA: Diagnosis not present

## 2020-01-23 DIAGNOSIS — Z95810 Presence of automatic (implantable) cardiac defibrillator: Secondary | ICD-10-CM

## 2020-01-23 DIAGNOSIS — Z79899 Other long term (current) drug therapy: Secondary | ICD-10-CM

## 2020-01-23 MED ORDER — SPIRONOLACTONE 25 MG PO TABS: 25.0000 mg | ORAL_TABLET | Freq: Every day | ORAL | 3 refills | Status: DC

## 2020-01-23 NOTE — Progress Notes (Signed)
OFFICE NOTE  Chief Complaint:  Shortness of breath, leg edema  Primary Care Physician: [name], MD  HPI:  Dean Gelles Sr. is a 69 y.o. male with a past medial history significant for acute on chronic systolic congestive heart failure.  His LVEF is about 30 to 35% he has a known left bundle branch block and is status post AICD.  I first met him as a new cardiology patient in December 2017. He had a VT or VF arrest requiring CPR, defibrillation and medications.  He also has obstructive sleep apnea, hypertension, dyslipidemia and type 2 diabetes.  Since then he has had a number of hospitalization is been managed by my partners and I have not seen him back in the office.  More recently, he was hospitalized in September apparently after his PCP a cut back some of his diuretics possibly due to worsening renal function.  His device impedance trends have shown an increase suggesting volume overload.  He was also short of breath.  He required IV diuresis and was placed on Aldactone in addition to his Lasix.  He had been transitioned over to valsartan 320 mg daily however he reports that he is felt somewhat fatigued since being on that medicine over the past year.  Of note his diastolic blood pressures are somewhat low around 50.  Since discharge though he has felt better with regards to heart failure.  His discharge weight was 250 pounds and now he is at 245 pounds.  He denies any worsening fluid overload or edema.  He also has paroxysmal atrial fibrillation and has had some short runs of A. fib that have been picked up on his Linq and device, however he is on amiodarone and was in sinus rhythm while hospitalized.  10/20/2019  Dean [name] is seen today in the office in follow-up.  I last saw him via virtual visit about 3 months ago.  I decreased his valsartan due to some issues with fatigue and low diastolic blood pressure.  His diuretic had been decreased from 80 twice a day to 80 once a day and  then recently he has been having some diarrhea.  He was advised to stop the Lasix and has been off of it for about a week.  Weight does appear to be up somewhat since I last saw him but had come down prior to that.  He denies any worsening heart failure symptoms.  He had a recent remote check which showed no significant events on his ICD.  He also has a Linq.  01/23/2020  Dean [name] returns today for follow-up.  He appears somewhat short of breath today as well as is noted to be coughing.  Most recently had a remote device check on December 31, 2019.  This indicated an increased OptiVol which has been persistent for some time.  He was also seen by his PCP who increased his Lasix from 80 twice daily to 80 3 times daily.  He has had recently some improvement in weight with that although continues to have a persistent cough of clear phlegm.  He does get some shortness of breath with exertion.  Blood pressure is elevated today 156/62.  Again his last LVEF was 30 to 35% as of July 2020.   PMHx:  Past Medical History:  Diagnosis Date  . 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 Arthritis    "maybe in his spine" (09/05/2016)  . Asthma    pt. denies  . Cardiac arrest ([place])  08/27/2016   REQUIRING CPR, SHOCK & MEDICATIONS  . CHF (congestive heart failure) ([place])   . Coronary artery disease    90% ostRCA stenosis and total occlusion of dRCA with L-->R collaterals 2017  . Dysrhythmia   . Gout   . High cholesterol   . Hypertension   . Ischemic cardiomyopathy   . LBBB (left bundle branch block)   . OSA on CPAP   . Type II diabetes mellitus ([place])     Past Surgical History:  Procedure Laterality Date  . CARDIAC CATHETERIZATION N/A 09/07/2016   Procedure: Left Heart Cath and Coronary Angiography;  Surgeon: Peter M Martinique, MD;  Location: [facility];  Service: Cardiovascular;  Laterality: N/A;  . COLONOSCOPY WITH PROPOFOL N/A 08/30/2018   Procedure: COLONOSCOPY WITH  PROPOFOL;  Surgeon: Carol Ada, MD;  Location: [facility];  Service: Endoscopy;  Laterality: N/A;  . EP IMPLANTABLE DEVICE N/A 10/10/2016   Procedure: ICD Implant;  Surgeon: Will Meredith Leeds, MD;  Location: [facility];  Service: Cardiovascular;  Laterality: N/A;  . LUMBAR LAMINECTOMY/DECOMPRESSION MICRODISCECTOMY N/A 01/18/2018   Procedure: L2-3, L3-4, L-4-5 CENTRAL DECOMPRESSION;  Surgeon: [name], MD;  Location: [facility];  Service: Orthopedics;  Laterality: N/A;  . POLYPECTOMY  08/30/2018   Procedure: POLYPECTOMY;  Surgeon: Carol Ada, MD;  Location: [facility];  Service: Endoscopy;;    FAMHx:  Family History  Problem Relation Age of Onset  . Heart attack Father        died in his 73's  . Hypertension Sister   . Cancer Brother        uncertain, "leg"  . Hypertension Sister     SOCHx:   reports that he has never smoked. He has never used smokeless tobacco. He reports that he does not drink alcohol or use drugs.  ALLERGIES:  Allergies  Allergen Reactions  . Contrast Media [Iodinated Diagnostic Agents] Shortness Of Breath and Nausea And Vomiting  . Lisinopril Cough    ROS: Pertinent items noted in HPI and remainder of comprehensive ROS otherwise negative.  HOME MEDS: Current Outpatient Medications on File Prior to Visit  Medication Sig Dispense Refill  . allopurinol (ZYLOPRIM) 300 MG tablet Take 300 mg by mouth daily.    [name] amiodarone (PACERONE) 100 MG tablet Take 1 tablet (100 mg total) by mouth daily. 90 tablet 1  . atorvastatin (LIPITOR) 40 MG tablet Take 1 tablet (40 mg total) by mouth daily at 6 PM. (Patient taking differently: Take 40 mg by mouth daily at 12 noon. ) 30 tablet 5  . carvedilol (COREG) 25 MG tablet Take 1 tablet (25 mg total) by mouth 2 (two) times daily with a meal. 60 tablet 7  . CVS VITAMIN D3 1000 units capsule Take 1,000 Units by mouth daily.  5  . ELIQUIS 5 MG TABS tablet TAKE 1 TABLET BY MOUTH TWICE A DAY (Patient taking  differently: Take 5 mg by mouth 2 (two) times daily. ) 60 tablet 5  . furosemide (LASIX) 80 MG tablet Take 80 mg by mouth 2 (two) times daily.    . hydrOXYzine (ATARAX/VISTARIL) 25 MG tablet Take 1 tablet (25 mg total) by mouth 3 (three) times daily as needed for anxiety. 30 tablet 0  . magnesium oxide (MAG-OX) 400 MG tablet Take 200 mg by mouth 2 (two) times daily.    . valsartan (DIOVAN) 160 MG tablet Take 160 mg by mouth daily.    [name] ZINC-VITAMIN C PO Take 1 tablet  by mouth daily.    . sodium bicarbonate 650 MG tablet Take 1 tablet (650 mg total) by mouth 3 (three) times daily. (Patient not taking: Reported on 01/23/2020) 90 tablet 0   No current facility-administered medications on file prior to visit.    LABS/IMAGING: No results found for this or any previous visit (from the past 48 hour(s)). No results found.  LIPID PANEL:    Component Value Date/Time   CHOL 159 09/07/2016 0844   TRIG 146 09/07/2016 0844   HDL 25 (L) 09/07/2016 0844   CHOLHDL 6.4 09/07/2016 0844   VLDL 29 09/07/2016 0844   LDLCALC 105 (H) 09/07/2016 0844     WEIGHTS: Wt Readings from Last 3 Encounters:  01/23/20 251 lb 12.8 oz (114.2 kg)  12/11/19 254 lb (115.2 kg)  11/15/19 247 lb (112 kg)    VITALS: BP (!) 156/62   Pulse 68   Ht '5\' 6"'  (1.676 m)   Wt 251 lb 12.8 oz (114.2 kg)   SpO2 99%   BMI 40.64 kg/m   EXAM: General appearance: alert, no distress and morbidly obese Neck: JVD - a few cm above sternal notch, no carotid bruit and thyroid not enlarged, symmetric, no tenderness/mass/nodules Lungs: diminished breath sounds bibasilar Heart: regular rate and rhythm Abdomen: soft, non-tender; bowel sounds normal; no masses,  no organomegaly and obese Extremities: edema 1+ bilateral pitting edema Pulses: 2+ and symmetric Skin: Skin color, texture, turgor normal. No rashes or lesions Neurologic: Grossly normal Psych: Pleasant  EKG: Deferred  ASSESSMENT: 1. Acute on chronic systolic congestive  heart failure, LVEF 30 to 35%, NYHA class III symptoms 2. History of VT/VF arrest status post AICD 3. 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 OSA on CPAP 6. Hypertension 7. Dyslipidemia 8. Type 2 diabetes 9. Chronic anemia 10. LBBB  PLAN: 1.   Dean [name] reports persistent shortness of breath with NYHA class III symptoms.  He is volume overloaded and recently his PCP increased his Lasix to 80 mg 3 times daily.  I would like to go ahead and add Aldactone 25 mg daily to his regimen.  We will plan follow-up in about 3 months with a repeat echocardiogram.  If there is no significant improvement or decline in LV function, would consider switching his valsartan to Entresto.  For now, continue low-dose amiodarone 100 mg daily however we may be able to consider discontinuing that as his device shows no further events.  Follow-up with me in 3 months.  Pixie Casino, MD, [facility], [place] Director of the Advanced Lipid Disorders &  Cardiovascular Risk Reduction Clinic Diplomate of the American Board of Clinical Lipidology Attending Cardiologist  Direct Dial: [phone]  Fax: [phone]  Website:  [email or web address] 01/23/2020, 1:41 PM

## 2020-01-23 NOTE — Patient Instructions (Signed)
Medication Instructions:  START spironolactone 25mg  daily Continue all other current medications  *If you need a refill on your cardiac medications before your next appointment, please call your pharmacy*   Lab Work: CMET & BNP in 1 week   If you have labs (blood work) drawn today and your tests are completely normal, you will receive your results only by: [name] MyChart Message (if you have MyChart) OR . A paper copy in the mail If you have any lab test that is abnormal or we need to change your treatment, we will call you to review the results.   Testing/Procedures: ECHOCARDIOGRAM in 3 months @ [address] Floor   Follow-Up: At [facility], you and your health needs are our priority.  As part of our continuing mission to provide you with exceptional heart care, we have created designated Provider Care Teams.  These Care Teams include your primary Cardiologist (physician) and Advanced Practice Providers (APPs -  Physician Assistants and Nurse Practitioners) who all work together to provide you with the care you need, when you need it.  We recommend signing up for the patient portal called "MyChart".  Sign up information is provided on this After Visit Summary.  MyChart is used to connect with patients for Virtual Visits (Telemedicine).  Patients are able to view lab/test results, encounter notes, upcoming appointments, etc.  Non-urgent messages can be sent to your provider as well.   To learn more about what you can do with MyChart, go to [email or web address].    Your next appointment:   3 month(s)  The format for your next appointment:   In Person  Provider:   You may see Pixie Casino, MD or one of the following Advanced Practice Providers on your designated Care Team:    Almyra Deforest, PA-C  Fabian Sharp, PA-C or   Roby Lofts, [place]    Other Instructions

## 2020-01-24 LAB — COMPREHENSIVE METABOLIC PANEL
ALT: 18 IU/L (ref 0–44)
AST: 21 IU/L (ref 0–40)
Albumin/Globulin Ratio: 1.3 (ref 1.2–2.2)
Albumin: 4 g/dL (ref 3.8–4.8)
Alkaline Phosphatase: 136 IU/L — ABNORMAL HIGH (ref 39–117)
BUN/Creatinine Ratio: 12 (ref 10–24)
BUN: 17 mg/dL (ref 8–27)
Bilirubin Total: 0.9 mg/dL (ref 0.0–1.2)
CO2: 25 mmol/L (ref 20–29)
Calcium: 9.8 mg/dL (ref 8.6–10.2)
Chloride: 102 mmol/L (ref 96–106)
Creatinine, Ser: 1.42 mg/dL — ABNORMAL HIGH (ref 0.76–1.27)
GFR calc Af Amer: 57 mL/min/{1.73_m2} — ABNORMAL LOW (ref >59)
GFR calc non Af Amer: 50 mL/min/{1.73_m2} — ABNORMAL LOW (ref >59)
Globulin, Total: 3.2 g/dL (ref 1.5–4.5)
Glucose: 97 mg/dL (ref 65–99)
Potassium: 4 mmol/L (ref 3.5–5.2)
Sodium: 143 mmol/L (ref 134–144)
Total Protein: 7.2 g/dL (ref 6.0–8.5)

## 2020-01-24 LAB — BRAIN NATRIURETIC PEPTIDE: BNP: 318.7 pg/mL — ABNORMAL HIGH (ref 0.0–100.0)

## 2020-02-04 ENCOUNTER — Telehealth: Payer: Self-pay

## 2020-02-04 NOTE — Telephone Encounter (Signed)
Received call from patient.  He reports shortness of breath and ankle swelling comes and goes.  He feels better some days than others and when feeling better he tries to increase activity level.  He requested to have a remote transmission review today to make sure the fluid accumulation is not worsening.  Assisted in sending remote transmission and reviewed with patient.  Advised fluid levels remain the same with some days showing improvement.  Activity level has increased since March.  Advised to limit salt and fluid intake as discussed during previous ICM calls. Advised if symptoms worsen to call if needed.  He appreciated the assistance today.   Optivol report 02/04/2020

## 2020-02-16 ENCOUNTER — Ambulatory Visit (INDEPENDENT_AMBULATORY_CARE_PROVIDER_SITE_OTHER): Payer: Medicare (Managed Care)

## 2020-02-16 DIAGNOSIS — Z95810 Presence of automatic (implantable) cardiac defibrillator: Secondary | ICD-10-CM | POA: Diagnosis not present

## 2020-02-16 DIAGNOSIS — I5022 Chronic systolic (congestive) heart failure: Secondary | ICD-10-CM | POA: Diagnosis not present

## 2020-02-17 NOTE — Progress Notes (Signed)
EPIC Encounter for ICM Monitoring  Patient Name: Dean Bouley Sr. is a 69 y.o. male Date: 02/17/2020 Primary Care Physican: [name], MD Primary Cardiologist:Hilty Electrophysiologist:Camnitz 3/22/2021Weight: [id number] 12/31/2019 Weights: 255 lbs with clothes 02/17/2020 Weight: 240 lbs  Spoke with patient.  He said he feels better since losing fluid accumulation has resolved.  Breathing has returned to normal and he has more energy.  OptivolThoracic impedancereturned to normal since taking Furosemide 80 mg tid.Fluid index returned <threshold.  Prescribed:  Furosemide80 mg Take 80 mg by mouth in the morning, at noon, and at bedtime (ordered by PCP)  Spironolactone 25 mg take 1 tablet daily  Eliquis 5 mg take 1 tablet twice a day  Labs: 01/23/2020 Creatinine 1.42, BUN 17, Potassium 4.0, Sodium 143, GFR 50-57 12/11/2019 Creatinine 1.40, BUN 21, Potassium 4.3, Sodium 143, GFR 51-58 11/15/2019 Creatinine1.23, BUN11, Potassium4.1, [id number], GFR59->60 11/05/2019 Creatinine1.41, BUN15, Potassium3.9, [id number], [id number] 10/26/2019 Creatinine2.10, BUN41, Potassium5.2, [id number], [id number] 10/25/2019 Creatinine2.29, BUN45, Potassium4.4, Sodium134, [id number]  10/24/2019 Creatinine2.62, BUN54, Potassium5.3, [id number], [id number]  01/28/2021Creatinine 3.39, BUN60, Potassium6.2, Sodium138, [id number] (7:07AM) 01/28/2021Creatinine 3.82, BUN70, Potassium7.3, Sodium134, [id number] (1:32 AM) A complete set of results can be found in Results Review.  Recommendations:No changes and encouraged to call if experiencing any fluid symptoms.  Follow-up plan: ICM clinic phone appointment on6/28/2021. 91 day device clinic remote transmission7/03/2020.   Copy of ICM check sent to [facility] and Dr Debara Pickett as Juluis Rainier that patient's impedance has returned to normal since taking Furosemide 80 mg tid.  3 month ICM trend: 02/16/2020    1 Year ICM trend:       Rosalene Billings, RN 02/17/2020 9:03 AM

## 2020-03-31 ENCOUNTER — Ambulatory Visit (INDEPENDENT_AMBULATORY_CARE_PROVIDER_SITE_OTHER): Payer: Medicare (Managed Care) | Admitting: *Deleted

## 2020-03-31 DIAGNOSIS — I255 Ischemic cardiomyopathy: Secondary | ICD-10-CM

## 2020-03-31 LAB — CUP PACEART REMOTE DEVICE CHECK
Battery Remaining Longevity: 109 mo
Battery Voltage: 3.01 V
Brady Statistic RV Percent Paced: 0.06 %
Date Time Interrogation Session: 20210707043724
HighPow Impedance: 79 Ohm
Implantable Lead Implant Date: 20180116
Implantable Lead Location: 753860
Implantable Lead Model: 6935
Implantable Pulse Generator Implant Date: 20180116
Lead Channel Impedance Value: 266 Ohm
Lead Channel Impedance Value: 342 Ohm
Lead Channel Pacing Threshold Amplitude: 0.625 V
Lead Channel Pacing Threshold Pulse Width: 0.4 ms
Lead Channel Sensing Intrinsic Amplitude: 12.25 mV
Lead Channel Sensing Intrinsic Amplitude: 12.25 mV
Lead Channel Setting Pacing Amplitude: 2.5 V
Lead Channel Setting Pacing Pulse Width: 0.4 ms
Lead Channel Setting Sensing Sensitivity: 0.3 mV

## 2020-04-01 ENCOUNTER — Ambulatory Visit (INDEPENDENT_AMBULATORY_CARE_PROVIDER_SITE_OTHER): Payer: Medicare (Managed Care)

## 2020-04-01 DIAGNOSIS — I5022 Chronic systolic (congestive) heart failure: Secondary | ICD-10-CM | POA: Diagnosis not present

## 2020-04-01 DIAGNOSIS — Z95810 Presence of automatic (implantable) cardiac defibrillator: Secondary | ICD-10-CM | POA: Diagnosis not present

## 2020-04-01 NOTE — Progress Notes (Signed)
Remote ICD transmission.   

## 2020-04-02 ENCOUNTER — Telehealth: Payer: Self-pay

## 2020-04-02 NOTE — Telephone Encounter (Signed)
Remote ICM transmission received.  Attempted call to patient regarding ICM remote transmission and left detailed message per DPR.  Advised to return call for any fluid symptoms or questions. Next ICM remote transmission scheduled 05/03/2020.     

## 2020-04-02 NOTE — Progress Notes (Signed)
EPIC Encounter for ICM Monitoring  Patient Name: Dean Atienza Sr. is a 69 y.o. male Date: 04/02/2020 Primary Care Physican: [name], MD Primary Cardiologist:Hilty Electrophysiologist:Camnitz 02/17/2020 Weight: 240 lbs  Time in AF 0.2 hr/day (0.7%) (taking Eliquis)  Attempted call to patient and unable to reach.  Left detailed message per DPR regarding transmission. Transmission reviewed.   OptivolThoracic impedancereturned to normal since taking Furosemide 80 mg tid.Fluid index returned <threshold.  Prescribed:  Furosemide80 mg Take 80 mg by mouth in the morning, at noon, and at bedtime (ordered by PCP)  Spironolactone 25 mg take 1 tablet daily  Eliquis 5 mg take 1 tablet twice a day  Labs: 01/23/2020 Creatinine 1.42, BUN 17, Potassium 4.0, Sodium 143, GFR 50-57 12/11/2019 Creatinine 1.40, BUN 21, Potassium 4.3, Sodium 143, GFR 51-58 11/15/2019 Creatinine1.23, BUN11, Potassium4.1, [id number], GFR59->60 11/05/2019 Creatinine1.41, BUN15, Potassium3.9, [id number], XHF41-42 10/26/2019 Creatinine2.10, BUN41, Potassium5.2, [id number], IDH68-61 10/25/2019 Creatinine2.29, BUN45, Potassium4.4, Sodium134, UOH72-90  10/24/2019 Creatinine2.62, BUN54, Potassium5.3, [id number], YEM33-61  01/28/2021Creatinine 3.39, BUN60, Potassium6.2, Sodium138, QAE49-75 (7:07AM) 01/28/2021Creatinine 3.82, BUN70, Potassium7.3, Sodium134, PYY51-10 (1:32 AM) A complete set of results can be found in Results Review.  Recommendations:Left voice mail with ICM number and encouraged to call if experiencing any fluid symptoms.  Follow-up plan: ICM clinic phone appointment on 05/03/2020.   91 day device clinic remote transmission 06/30/2020.    EP/Cardiology Office Visits: 05/13/2020 with Dr. Debara Pickett.    Copy of ICM check sent to Dr. Curt Bears.   3 month ICM trend: 03/31/2020    1 Year ICM trend:       Rosalene Billings, RN 04/02/2020 12:20 PM

## 2020-04-23 ENCOUNTER — Other Ambulatory Visit (HOSPITAL_COMMUNITY): Payer: Medicare (Managed Care)

## 2020-04-27 ENCOUNTER — Other Ambulatory Visit: Payer: Self-pay | Admitting: Internal Medicine

## 2020-05-03 ENCOUNTER — Telehealth: Payer: Self-pay

## 2020-05-03 ENCOUNTER — Ambulatory Visit (INDEPENDENT_AMBULATORY_CARE_PROVIDER_SITE_OTHER): Payer: Medicare (Managed Care)

## 2020-05-03 DIAGNOSIS — I5022 Chronic systolic (congestive) heart failure: Secondary | ICD-10-CM | POA: Diagnosis not present

## 2020-05-03 DIAGNOSIS — Z95810 Presence of automatic (implantable) cardiac defibrillator: Secondary | ICD-10-CM | POA: Diagnosis not present

## 2020-05-03 NOTE — Progress Notes (Signed)
EPIC Encounter for ICM Monitoring  Patient Name: Dean Nicol Sr. is a 69 y.o. male Date: 05/03/2020 Primary Care Physican: [name], MD Primary Cardiologist:Hilty Electrophysiologist:Camnitz 02/17/2020 Weight: 240 lbs  Time in AF        0.0 hr/day (0.0%) (taking Eliquis)  Attempted call to patient and unable to reach.  Left detailed message per DPR regarding transmission. Transmission reviewed.   OptivolThoracic impedancewas suggesting possible fluid accumulation since 04/28/2020 and trending back toward baseline.  Prescribed:  Furosemide80 mgTake 80 mg by mouth in the morning, at noon, and at bedtime(ordered by PCP)  Spironolactone 25 mg take 1 tablet daily  Eliquis 5 mg take 1 tablet twice a day  Labs: 01/23/2020 Creatinine 1.42, BUN 17, Potassium 4.0, Sodium 143, GFR 50-57 12/11/2019 Creatinine 1.40, BUN 21, Potassium 4.3, Sodium 143, GFR 51-58 11/15/2019 Creatinine1.23, BUN11, Potassium4.1, [id number], GFR59->60 11/05/2019 Creatinine1.41, BUN15, Potassium3.9, [id number], LID03-01 10/26/2019 Creatinine2.10, BUN41, Potassium5.2, THYHOO875, ZVJ28-20 10/25/2019 Creatinine2.29, BUN45, Potassium4.4, Sodium134, UOR56-15  10/24/2019 Creatinine2.62, BUN54, Potassium5.3, [id number], YJW92-95  01/28/2021Creatinine 3.39, BUN60, Potassium6.2, Sodium138, FMB34-03 (7:07AM) 01/28/2021Creatinine 3.82, BUN70, Potassium7.3, Sodium134, JQD64-38 (1:32 AM) A complete set of results can be found in Results Review.  Recommendations:Left voice mail with ICM number and encouraged to call if experiencing any fluid symptoms.  Follow-up plan: ICM clinic phone appointment on 05/12/2020 to recheck fluid levels.   91 day device clinic remote transmission 06/30/2020.    EP/Cardiology Office Visits: 05/13/2020 with Dr. Debara Pickett.  Message sent to EP scheduler to contact patient for past due appointment needed with Dr Curt Bears (last OV was 04/11/2019).  Copy  of ICM check sent to Dr. Curt Bears and Dr Debara Pickett.   3 month ICM trend: 05/03/2020    1 Year ICM trend:       Rosalene Billings, RN 05/03/2020 10:11 AM

## 2020-05-03 NOTE — Telephone Encounter (Signed)
Remote ICM transmission received.  Attempted call to patient regarding ICM remote transmission and left detailed message per DPR.  Advised to return call for any fluid symptoms or questions.  

## 2020-05-05 ENCOUNTER — Ambulatory Visit (HOSPITAL_COMMUNITY): Payer: Medicare (Managed Care) | Attending: Cardiology

## 2020-05-05 ENCOUNTER — Other Ambulatory Visit: Payer: Self-pay

## 2020-05-05 DIAGNOSIS — R0602 Shortness of breath: Secondary | ICD-10-CM | POA: Insufficient documentation

## 2020-05-05 DIAGNOSIS — I5043 Acute on chronic combined systolic (congestive) and diastolic (congestive) heart failure: Secondary | ICD-10-CM | POA: Diagnosis present

## 2020-05-05 DIAGNOSIS — Z95810 Presence of automatic (implantable) cardiac defibrillator: Secondary | ICD-10-CM | POA: Insufficient documentation

## 2020-05-05 LAB — ECHOCARDIOGRAM COMPLETE
AR max vel: 2.01 cm2
AV Peak grad: 9.5 mmHg
Ao pk vel: 1.54 m/s
Area-P 1/2: 3.27 cm2
P 1/2 time: 429 msec
S' Lateral: 5 cm

## 2020-05-05 MED ORDER — PERFLUTREN LIPID MICROSPHERE
1.0000 mL | INTRAVENOUS | Status: AC | PRN
  Administered 2020-05-05: 3 mL via INTRAVENOUS

## 2020-05-07 ENCOUNTER — Telehealth: Payer: Self-pay | Admitting: Internal Medicine

## 2020-05-07 MED ORDER — SACUBITRIL-VALSARTAN 49-51 MG PO TABS: 1.0000 | ORAL_TABLET | Freq: Two times a day (BID) | ORAL | 11 refills | Status: DC

## 2020-05-07 NOTE — Telephone Encounter (Signed)
Left message for patient that MD reviewed results and his recommendations will be sent via MyChart message. Rx sent to pharmacy. Patient has appointment 05/13/20 with MD.

## 2020-05-07 NOTE — Telephone Encounter (Signed)
Pixie Casino, MD  05/06/2020 11:16 AM EDT Back to Top    Stable LVEF at 30-35, global hypokinesis. Would recommend switch of Valsartan to Entreso 49/51 mg BID.  Dr. Lemmie Evens

## 2020-05-12 ENCOUNTER — Telehealth: Payer: Self-pay

## 2020-05-12 NOTE — Telephone Encounter (Signed)
Attempted call to patient on home and cell phone requesting to send remote transmission but mail box is full on cell phone and no answer on home phone.

## 2020-05-13 ENCOUNTER — Ambulatory Visit: Payer: Medicare (Managed Care) | Admitting: Internal Medicine

## 2020-05-13 ENCOUNTER — Encounter: Payer: Self-pay | Admitting: Internal Medicine

## 2020-05-13 ENCOUNTER — Telehealth: Payer: Self-pay

## 2020-05-13 ENCOUNTER — Other Ambulatory Visit: Payer: Self-pay

## 2020-05-13 VITALS — BP 150/70 | HR 84 | Ht 66.0 in | Wt 243.0 lb

## 2020-05-13 DIAGNOSIS — I255 Ischemic cardiomyopathy: Secondary | ICD-10-CM

## 2020-05-13 DIAGNOSIS — I5022 Chronic systolic (congestive) heart failure: Secondary | ICD-10-CM

## 2020-05-13 DIAGNOSIS — Z95810 Presence of automatic (implantable) cardiac defibrillator: Secondary | ICD-10-CM | POA: Diagnosis not present

## 2020-05-13 DIAGNOSIS — Z79899 Other long term (current) drug therapy: Secondary | ICD-10-CM

## 2020-05-13 NOTE — Patient Instructions (Signed)
Medication Instructions:  START entresto   *If you need a refill on your cardiac medications before your next appointment, please call your pharmacy*   Lab Work: CMET (lab test) in 2 weeks  If you have labs (blood work) drawn today and your tests are completely normal, you will receive your results only by: [name] MyChart Message (if you have MyChart) OR . A paper copy in the mail If you have any lab test that is abnormal or we need to change your treatment, we will call you to review the results.   Testing/Procedures: NONE   Follow-Up: At [facility], you and your health needs are our priority.  As part of our continuing mission to provide you with exceptional heart care, we have created designated Provider Care Teams.  These Care Teams include your primary Cardiologist (physician) and Advanced Practice Providers (APPs -  Physician Assistants and Nurse Practitioners) who all work together to provide you with the care you need, when you need it.  We recommend signing up for the patient portal called "MyChart".  Sign up information is provided on this After Visit Summary.  MyChart is used to connect with patients for Virtual Visits (Telemedicine).  Patients are able to view lab/test results, encounter notes, upcoming appointments, etc.  Non-urgent messages can be sent to your provider as well.   To learn more about what you can do with MyChart, go to [email or web address].    Your next appointment:   6 month(s)  The format for your next appointment:   In Person  Provider:   You may see Pixie Casino, MD or one of the following Advanced Practice Providers on your designated Care Team:    Almyra Deforest, PA-C  Fabian Sharp, PA-C or   Roby Lofts, [place]    Other Instructions

## 2020-05-13 NOTE — Progress Notes (Signed)
OFFICE NOTE  Chief Complaint:  No complaints  Primary Care Physician: [name], MD  HPI:  Dean Thivierge Sr. is a 69 y.o. male with a past medial history significant for acute on chronic systolic congestive heart failure.  His LVEF is about 30 to 35% he has a known left bundle branch block and is status post AICD.  I first met him as a new cardiology patient in December 2017. He had a VT or VF arrest requiring CPR, defibrillation and medications.  He also has obstructive sleep apnea, hypertension, dyslipidemia and type 2 diabetes.  Since then he has had a number of hospitalization is been managed by my partners and I have not seen him back in the office.  More recently, he was hospitalized in September apparently after his PCP a cut back some of his diuretics possibly due to worsening renal function.  His device impedance trends have shown an increase suggesting volume overload.  He was also short of breath.  He required IV diuresis and was placed on Aldactone in addition to his Lasix.  He had been transitioned over to valsartan 320 mg daily however he reports that he is felt somewhat fatigued since being on that medicine over the past year.  Of note his diastolic blood pressures are somewhat low around 50.  Since discharge though he has felt better with regards to heart failure.  His discharge weight was 250 pounds and now he is at 245 pounds.  He denies any worsening fluid overload or edema.  He also has paroxysmal atrial fibrillation and has had some short runs of A. fib that have been picked up on his Linq and device, however he is on amiodarone and was in sinus rhythm while hospitalized.  10/20/2019  Dean [name] is seen today in the office in follow-up.  I last saw him via virtual visit about 3 months ago.  I decreased his valsartan due to some issues with fatigue and low diastolic blood pressure.  His diuretic had been decreased from 80 twice a day to 80 once a day and then recently he  has been having some diarrhea.  He was advised to stop the Lasix and has been off of it for about a week.  Weight does appear to be up somewhat since I last saw him but had come down prior to that.  He denies any worsening heart failure symptoms.  He had a recent remote check which showed no significant events on his ICD.  He also has a Linq.  01/23/2020  Dean [name] returns today for follow-up.  He appears somewhat short of breath today as well as is noted to be coughing.  Most recently had a remote device check on December 31, 2019.  This indicated an increased OptiVol which has been persistent for some time.  He was also seen by his PCP who increased his Lasix from 80 twice daily to 80 3 times daily.  He has had recently some improvement in weight with that although continues to have a persistent cough of clear phlegm.  He does get some shortness of breath with exertion.  Blood pressure is elevated today 156/62.  Again his last LVEF was 30 to 35% as of July 2020.   05/13/2020  Dean [name] is seen today in follow-up.  Overall he is doing really well.  He has improved with regards to his edema after adjusting his diuretics.  BNP 3 months ago was 318.  Creatinine has been stable.  We repeated an echo which shows stable LVEF 30 to 35% and global hypokinesis.  I advised switching his valsartan over to Entresto 49/51 mg twice daily.  He does have the medicine but has not yet started it.  He was also due for a remote check yesterday but that was missed.  I noted that the device clinic did call and try to leave a voicemail but his mailbox was full.  He said he would reach out to them and try to reschedule it.  I am interested to see whether he is having any further arrhythmias or whether we could consider stopping his amiodarone.  PMHx:  Past Medical History:  Diagnosis Date  . 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 Arthritis    "maybe in his spine" (09/05/2016)  . Asthma    pt.  denies  . Cardiac arrest ([place]) 08/27/2016   REQUIRING CPR, SHOCK & MEDICATIONS  . CHF (congestive heart failure) ([place])   . Coronary artery disease    90% ostRCA stenosis and total occlusion of dRCA with L-->R collaterals 2017  . Dysrhythmia   . Gout   . High cholesterol   . Hypertension   . Ischemic cardiomyopathy   . LBBB (left bundle branch block)   . OSA on CPAP   . Type II diabetes mellitus ([place])     Past Surgical History:  Procedure Laterality Date  . CARDIAC CATHETERIZATION N/A 09/07/2016   Procedure: Left Heart Cath and Coronary Angiography;  Surgeon: Peter M Martinique, MD;  Location: [facility];  Service: Cardiovascular;  Laterality: N/A;  . COLONOSCOPY WITH PROPOFOL N/A 08/30/2018   Procedure: COLONOSCOPY WITH PROPOFOL;  Surgeon: Carol Ada, MD;  Location: [facility];  Service: Endoscopy;  Laterality: N/A;  . EP IMPLANTABLE DEVICE N/A 10/10/2016   Procedure: ICD Implant;  Surgeon: Will Meredith Leeds, MD;  Location: [facility];  Service: Cardiovascular;  Laterality: N/A;  . LUMBAR LAMINECTOMY/DECOMPRESSION MICRODISCECTOMY N/A 01/18/2018   Procedure: L2-3, L3-4, L-4-5 CENTRAL DECOMPRESSION;  Surgeon: [name], MD;  Location: [facility];  Service: Orthopedics;  Laterality: N/A;  . POLYPECTOMY  08/30/2018   Procedure: POLYPECTOMY;  Surgeon: Carol Ada, MD;  Location: [facility];  Service: Endoscopy;;    FAMHx:  Family History  Problem Relation Age of Onset  . Heart attack Father        died in his 11's  . Hypertension Sister   . Cancer Brother        uncertain, "leg"  . Hypertension Sister     SOCHx:   reports that he has never smoked. He has never used smokeless tobacco. He reports that he does not drink alcohol and does not use drugs.  ALLERGIES:  Allergies  Allergen Reactions  . Contrast Media [Iodinated Diagnostic Agents] Shortness Of Breath and Nausea And Vomiting  . Lisinopril Cough    ROS: Pertinent items noted in HPI and remainder  of comprehensive ROS otherwise negative.  HOME MEDS: Current Outpatient Medications on File Prior to Visit  Medication Sig Dispense Refill  . allopurinol (ZYLOPRIM) 300 MG tablet Take 300 mg by mouth daily.    [name] amiodarone (PACERONE) 100 MG tablet TAKE 1 TABLET BY MOUTH EVERY DAY 90 tablet 1  . atorvastatin (LIPITOR) 40 MG tablet Take 1 tablet (40 mg total) by mouth daily at 6 PM. (Patient taking differently: Take 40 mg by mouth daily at 12 noon. ) 30 tablet 5  . carvedilol (COREG) 25 MG tablet Take 1 tablet (  25 mg total) by mouth 2 (two) times daily with a meal. 60 tablet 7  . CVS VITAMIN D3 1000 units capsule Take 1,000 Units by mouth daily.  5  . ELIQUIS 5 MG TABS tablet TAKE 1 TABLET BY MOUTH TWICE A DAY (Patient taking differently: Take 5 mg by mouth 2 (two) times daily. ) 60 tablet 5  . furosemide (LASIX) 80 MG tablet Take 80 mg by mouth in the morning, at noon, and at bedtime.     . hydrOXYzine (ATARAX/VISTARIL) 25 MG tablet Take 1 tablet (25 mg total) by mouth 3 (three) times daily as needed for anxiety. 30 tablet 0  . magnesium oxide (MAG-OX) 400 MG tablet Take 200 mg by mouth 2 (two) times daily.    . sacubitril-valsartan (ENTRESTO) 49-51 MG Take 1 tablet by mouth 2 (two) times daily. 60 tablet 11  . sodium bicarbonate 650 MG tablet Take 1 tablet (650 mg total) by mouth 3 (three) times daily. 90 tablet 0  . ZINC-VITAMIN C PO Take 1 tablet by mouth daily.    [name] spironolactone (ALDACTONE) 25 MG tablet Take 1 tablet (25 mg total) by mouth daily. 90 tablet 3   No current facility-administered medications on file prior to visit.    LABS/IMAGING: No results found for this or any previous visit (from the past 48 hour(s)). No results found.  LIPID PANEL:    Component Value Date/Time   CHOL 159 09/07/2016 0844   TRIG 146 09/07/2016 0844   HDL 25 (L) 09/07/2016 0844   CHOLHDL 6.4 09/07/2016 0844   VLDL 29 09/07/2016 0844   LDLCALC 105 (H) 09/07/2016 0844     WEIGHTS: Wt Readings  from Last 3 Encounters:  05/13/20 243 lb (110.2 kg)  01/23/20 251 lb 12.8 oz (114.2 kg)  12/11/19 254 lb (115.2 kg)    VITALS: BP (!) 150/70   Pulse 84   Ht '5\' 6"'  (1.676 m)   Wt 243 lb (110.2 kg)   BMI 39.22 kg/m   EXAM: General appearance: alert, no distress and morbidly obese Neck: no carotid bruit, no JVD and thyroid not enlarged, symmetric, no tenderness/mass/nodules Lungs: clear to auscultation bilaterally Heart: regular rate and rhythm Abdomen: soft, non-tender; bowel sounds normal; no masses,  no organomegaly Extremities: extremities normal, atraumatic, no cyanosis or edema Pulses: 2+ and symmetric Skin: Skin color, texture, turgor normal. No rashes or lesions Neurologic: Grossly normal Psych: Pleasant  EKG: Deferred  ASSESSMENT: 1. Chronic systolic congestive heart failure, LVEF 30 to 35%, NYHA class II symptoms 2. History of VT/VF arrest status post AICD 3. 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 OSA on CPAP 6. Hypertension 7. Dyslipidemia 8. Type 2 diabetes 9. Chronic anemia 10. LBBB  PLAN: 1.   Dean [name] appears euvolemic on exam today.  His weight is come down and he reports no worsening shortness of breath.  He is due for repeat device check which was missed yesterday.  I advised him to switch to [facility] which he is about to start.  We will plan repeat labs in a couple weeks.  Follow-up with me otherwise in 6 months for his heart failure.  Pixie Casino, MD, [facility], [place] Director of the Advanced Lipid Disorders &  Cardiovascular Risk Reduction Clinic Diplomate of the American Board of Clinical Lipidology Attending Cardiologist  Direct Dial: [phone]  Fax: [phone]  Website:  [email or web address] 05/13/2020, 2:29 PM

## 2020-05-13 NOTE — Telephone Encounter (Signed)
I called pt about missed ICM transmission. I could not leave a message because the mailbox is full.

## 2020-05-18 NOTE — Progress Notes (Signed)
No ICM remote transmission received for 05/12/2020 and next ICM transmission scheduled for 06/07/2020.

## 2020-06-02 ENCOUNTER — Emergency Department (HOSPITAL_COMMUNITY): Payer: Medicare (Managed Care)

## 2020-06-02 ENCOUNTER — Other Ambulatory Visit: Payer: Self-pay

## 2020-06-02 ENCOUNTER — Emergency Department (HOSPITAL_COMMUNITY)
Admission: EM | Admit: 2020-06-02 | Discharge: 2020-06-03 | Disposition: A | Discharge status: Home / Self Care | Payer: Medicare (Managed Care) | Attending: Emergency Medicine | Admitting: Emergency Medicine

## 2020-06-02 ENCOUNTER — Encounter (HOSPITAL_COMMUNITY): Payer: Self-pay | Admitting: *Deleted

## 2020-06-02 DIAGNOSIS — Z79899 Other long term (current) drug therapy: Secondary | ICD-10-CM | POA: Diagnosis not present

## 2020-06-02 DIAGNOSIS — R197 Diarrhea, unspecified: Secondary | ICD-10-CM | POA: Diagnosis not present

## 2020-06-02 DIAGNOSIS — J45901 Unspecified asthma with (acute) exacerbation: Secondary | ICD-10-CM | POA: Diagnosis not present

## 2020-06-02 DIAGNOSIS — Z20822 Contact with and (suspected) exposure to covid-19: Secondary | ICD-10-CM | POA: Diagnosis not present

## 2020-06-02 DIAGNOSIS — N179 Acute kidney failure, unspecified: Secondary | ICD-10-CM | POA: Diagnosis not present

## 2020-06-02 DIAGNOSIS — E1159 Type 2 diabetes mellitus with other circulatory complications: Secondary | ICD-10-CM | POA: Insufficient documentation

## 2020-06-02 DIAGNOSIS — R531 Weakness: Secondary | ICD-10-CM | POA: Diagnosis present

## 2020-06-02 DIAGNOSIS — I5042 Chronic combined systolic (congestive) and diastolic (congestive) heart failure: Secondary | ICD-10-CM | POA: Diagnosis not present

## 2020-06-02 DIAGNOSIS — Z9861 Coronary angioplasty status: Secondary | ICD-10-CM | POA: Insufficient documentation

## 2020-06-02 DIAGNOSIS — Z95810 Presence of automatic (implantable) cardiac defibrillator: Secondary | ICD-10-CM | POA: Diagnosis not present

## 2020-06-02 DIAGNOSIS — I5043 Acute on chronic combined systolic (congestive) and diastolic (congestive) heart failure: Secondary | ICD-10-CM | POA: Insufficient documentation

## 2020-06-02 DIAGNOSIS — E1122 Type 2 diabetes mellitus with diabetic chronic kidney disease: Secondary | ICD-10-CM | POA: Insufficient documentation

## 2020-06-02 DIAGNOSIS — N183 Chronic kidney disease, stage 3 unspecified: Secondary | ICD-10-CM | POA: Insufficient documentation

## 2020-06-02 DIAGNOSIS — I2510 Atherosclerotic heart disease of native coronary artery without angina pectoris: Secondary | ICD-10-CM | POA: Insufficient documentation

## 2020-06-02 DIAGNOSIS — I130 Hypertensive heart and chronic kidney disease with heart failure and stage 1 through stage 4 chronic kidney disease, or unspecified chronic kidney disease: Secondary | ICD-10-CM | POA: Insufficient documentation

## 2020-06-02 DIAGNOSIS — Z7901 Long term (current) use of anticoagulants: Secondary | ICD-10-CM | POA: Insufficient documentation

## 2020-06-02 LAB — CBC
HCT: 38.2 % — ABNORMAL LOW (ref 39.0–52.0)
Hemoglobin: 11.9 g/dL — ABNORMAL LOW (ref 13.0–17.0)
MCH: 27.5 pg (ref 26.0–34.0)
MCHC: 31.2 g/dL (ref 30.0–36.0)
MCV: 88.4 fL (ref 80.0–100.0)
Platelets: 209 10*3/uL (ref 150–400)
RBC: 4.32 MIL/uL (ref 4.22–5.81)
RDW: 15.4 % (ref 11.5–15.5)
WBC: 13 10*3/uL — ABNORMAL HIGH (ref 4.0–10.5)
nRBC: 0 % (ref 0.0–0.2)

## 2020-06-02 LAB — TROPONIN I (HIGH SENSITIVITY): Troponin I (High Sensitivity): 17 ng/L (ref <18)

## 2020-06-02 LAB — BASIC METABOLIC PANEL
Anion gap: 15 (ref 5–15)
BUN: 43 mg/dL — ABNORMAL HIGH (ref 8–23)
CO2: 23 mmol/L (ref 22–32)
Calcium: 10.3 mg/dL (ref 8.9–10.3)
Chloride: 95 mmol/L — ABNORMAL LOW (ref 98–111)
Creatinine, Ser: 2.18 mg/dL — ABNORMAL HIGH (ref 0.61–1.24)
GFR calc Af Amer: 34 mL/min — ABNORMAL LOW (ref >60)
GFR calc non Af Amer: 30 mL/min — ABNORMAL LOW (ref >60)
Glucose, Bld: 93 mg/dL (ref 70–99)
Potassium: 3.8 mmol/L (ref 3.5–5.1)
Sodium: 133 mmol/L — ABNORMAL LOW (ref 135–145)

## 2020-06-02 NOTE — ED Triage Notes (Signed)
Pt c/o weakness in his legs and an elevated bp  For the past 3 days no feet and ankle swelling  On blood thinners

## 2020-06-03 LAB — SARS CORONAVIRUS 2 BY RT PCR (HOSPITAL ORDER, PERFORMED IN ~~LOC~~ HOSPITAL LAB): SARS Coronavirus 2: NEGATIVE

## 2020-06-03 MED ORDER — SODIUM CHLORIDE 0.9 % IV BOLUS
500.0000 mL | Freq: Once | INTRAVENOUS | Status: AC
  Administered 2020-06-03: 500 mL via INTRAVENOUS

## 2020-06-03 NOTE — Progress Notes (Addendum)
Was called from [place] from the ED regarding this patient of my colleague Dr. Debara Pickett.  Patient was not seen, chart reviewed and discussed with ED staff.  Patient has acute on chronic renal failure in the setting of likely dehydration.  Patient felt to be volume deplete at this time.  He has a reduced ejection fraction and takes Lasix.  Recommendations by phone requested for possible ED discharge.  ED staff would like to give 500 cc bolus and inquires as to our recommendations.  Given critical staffing and bed shortage, their hope is to discharge patient from ED after hydration.  Would recommend repeat electrolyte panel with renal function tomorrow as an outpatient if the patient is discharged from the ED.  Patient will need to see his primary care physician on Monday.  He can hold his Lasix today and tomorrow, and resume Saturday if he is feeling back to normal.  Would encourage oral hydration to thirst.  We will arrange cardiovascular follow-up first available, likely 2 to 3 weeks from now.  It is critical the patient follows up with his primary care physician tomorrow or Monday, 06/07/2020.  If the patient is unable to reliably follow-up for what ever reason, would recommend observation admission to internal medicine.  Recommend patient obtain daily weights and daily vital signs and keep a log for his next appointment.

## 2020-06-03 NOTE — Discharge Instructions (Signed)
Please read and follow all provided instructions.  Your diagnoses today include:  1. Generalized weakness   2. Acute kidney injury ([place])     Tests performed today include:  Blood cell counts - were normal  Electrolytes and kidney function - showed that your kidneys are weak (creatinine level was almost 2.2)  Vital signs. See below for your results today.   Medications prescribed:   None  Take any prescribed medications only as directed.  Home care instructions:  Please follow the following plan:   Do not take your furosemide (Lasix) today or tomorrow.  Please restart normal regimen on Saturday.  You need to follow-up with Dr. Mellody Drown tomorrow.  This is very important so that you can have your kidney function rechecked to make sure that it is improving. I spoke with him on the telephone today  Follow-up instructions: Please follow-up with your cardioligst in the next 3 days for further evaluation of your symptoms.   Return instructions:   Please return to the Emergency Department if you experience worsening symptoms.   Return with worsening weakness, trouble walking, chest pain, or if you have trouble breathing.   Please return if you have any other emergent concerns.  Additional Information:  Your vital signs today were: BP (!) 152/58 (BP Location: Left Arm)   Pulse 65   Temp 97.7 F (36.5 C)   Resp 17   Ht 5\' 6"  (1.676 m)   Wt 110 kg   SpO2 100%   BMI 39.14 kg/m  If your blood pressure (BP) was elevated above 135/85 this visit, please have this repeated by your doctor within one month. --------------

## 2020-06-03 NOTE — ED Notes (Signed)
Sharyn Lull, daughter, [phone] would like a call when pt is in room

## 2020-06-03 NOTE — ED Provider Notes (Signed)
[facility] EMERGENCY DEPARTMENT Provider Note   CSN: [id number] Arrival date & time: 06/02/20  1931     History Chief Complaint  Patient presents with  . Hypertension    Dean Cicalese Sr. is a 69 y.o. male.  Patient with history of combined chronic heart failure on 80 mg furosemide 3 times daily, history of A. fib on Eliquis, history of cardiac arrest status post AICD placement --presents to the emergency department for generalized weakness over the past several days.  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  He states that over the past week or so he has had 1-2 episodes of watery diarrhea daily.  This has improved in the past day.  Reports seeing his cardiologist Dr. Debara Pickett in the past 2 to 3 weeks.  He has tried [employer] on and off, however this is caused joint pains and he discontinued.  He denies fevers, cough, shortness of breath.  He has been vaccinated for Covid and denies any sick contacts.  No lower extremity swelling.  No unilateral weakness or other stroke symptoms.  States that he worries about his kidneys as he has had problems in the past with adjustments of Lasix.  Onset of symptoms acute.  Course is constant.  Nothing makes symptoms better or worse.        Past Medical History:  Diagnosis Date  . 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 Arthritis    "maybe in his spine" (09/05/2016)  . Asthma    pt. denies  . Cardiac arrest ([place]) 08/27/2016   REQUIRING CPR, SHOCK & MEDICATIONS  . CHF (congestive heart failure) ([place])   . Coronary artery disease    90% ostRCA stenosis and total occlusion of dRCA with L-->R collaterals 2017  . Dysrhythmia   . Gout   . High cholesterol   . Hypertension   . Ischemic cardiomyopathy   . LBBB (left bundle branch block)   . OSA on CPAP   . Type II diabetes mellitus [facility])     Patient Active Problem List   Diagnosis Date Noted  . Hyperkalemia 10/23/2019  . Diarrhea  10/23/2019  . CHF (congestive heart failure) ([place]) 06/04/2019  . Acute systolic CHF (congestive heart failure) ([place]) 06/04/2019  . Sepsis ([place]) 05/06/2019  . Acute asthma exacerbation 05/06/2019  . Acute respiratory distress 05/06/2019  . Acute respiratory failure with hypoxia ([place]) 04/06/2019  . HCAP (healthcare-associated pneumonia) 04/06/2019  . Acute kidney failure ([place]) 02/27/2019  . Chronic combined systolic and diastolic CHF (congestive heart failure) ([place]) 02/27/2019  . CKD (chronic kidney disease), stage III 05/16/2018  . Normochromic normocytic anemia 05/15/2018  . CAD (coronary artery disease) 12/17/2017  . Hyperlipidemia LDL goal <70 12/17/2017  . 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 Cardiomyopathy, ischemic   . Debility 10/04/2016  . Stage 3 chronic kidney disease ([place])   . AKI (acute kidney injury) ([place])   . Disorientated   . Anoxic brain injury ([place]) 09/15/2016  . Benign essential HTN   . OSA (obstructive sleep apnea)   . Transaminitis   . Chronic respiratory failure with hypoxia ([place])   . Fever   . NSVT (nonsustained ventricular tachycardia) ([place])   . Uncomplicated asthma   . Diabetes mellitus type 2 in obese ([place])   . Acute on chronic combined systolic and diastolic CHF (congestive heart failure) ([place])   . Tachypnea   . Hypokalemia   . Leukocytosis   . Hypoxemia   . Acute respiratory failure ([place])   .  Cardiopulmonary arrest ([place]) 08/27/2016  . DM2 (diabetes mellitus, type 2) ([place]) 03/02/2014  . Morbid obesity ([place]) 03/02/2014  . Essential hypertension, benign 03/02/2014    Past Surgical History:  Procedure Laterality Date  . CARDIAC CATHETERIZATION N/A 09/07/2016   Procedure: Left Heart Cath and Coronary Angiography;  Surgeon: Peter M Martinique, MD;  Location: [facility];  Service: Cardiovascular;  Laterality: N/A;  . COLONOSCOPY WITH PROPOFOL N/A 08/30/2018   Procedure: COLONOSCOPY WITH PROPOFOL;  Surgeon: Carol Ada, MD;  Location: [facility];  Service: Endoscopy;  Laterality: N/A;  . EP IMPLANTABLE DEVICE N/A 10/10/2016   Procedure: ICD Implant;  Surgeon: Will Meredith Leeds, MD;  Location: [facility];  Service: Cardiovascular;  Laterality: N/A;  . LUMBAR LAMINECTOMY/DECOMPRESSION MICRODISCECTOMY N/A 01/18/2018   Procedure: L2-3, L3-4, L-4-5 CENTRAL DECOMPRESSION;  Surgeon: [name], MD;  Location: [facility];  Service: Orthopedics;  Laterality: N/A;  . POLYPECTOMY  08/30/2018   Procedure: POLYPECTOMY;  Surgeon: Carol Ada, MD;  Location: [facility];  Service: Endoscopy;;       Family History  Problem Relation Age of Onset  . Heart attack Father        died in his 52's  . Hypertension Sister   . Cancer Brother        uncertain, "leg"  . Hypertension Sister     Social History   Tobacco Use  . Smoking status: Never Smoker  . Smokeless tobacco: Never Used  Vaping Use  . Vaping Use: Never used  Substance Use Topics  . Alcohol use: No  . Drug use: No    Home Medications Prior to Admission medications   Medication Sig Start Date End Date Taking? Authorizing Provider  allopurinol (ZYLOPRIM) 300 MG tablet Take 300 mg by mouth daily. 08/16/16   [provider]  amiodarone (PACERONE) 100 MG tablet TAKE 1 TABLET BY MOUTH EVERY DAY 04/27/20   Hilty, Nadean Corwin, MD  atorvastatin (LIPITOR) 40 MG tablet Take 1 tablet (40 mg total) by mouth daily at 6 PM. Patient taking differently: Take 40 mg by mouth daily at 12 noon.  10/27/16   Camnitz, Ocie Doyne, MD  carvedilol (COREG) 25 MG tablet Take 1 tablet (25 mg total) by mouth 2 (two) times daily with a meal. 05/31/18   Duke, Tami Lin, PA  [employer] VITAMIN D3 1000 units capsule Take 1,000 Units by mouth daily. 08/16/16   [provider]  ELIQUIS 5 MG TABS tablet TAKE 1 TABLET BY MOUTH TWICE A DAY Patient taking differently: Take 5 mg by mouth 2 (two) times daily.  09/09/19   Hilty, Nadean Corwin, MD  furosemide (LASIX) 80 MG  tablet Take 80 mg by mouth in the morning, at noon, and at bedtime.     [provider]  hydrOXYzine (ATARAX/VISTARIL) 25 MG tablet Take 1 tablet (25 mg total) by mouth 3 (three) times daily as needed for anxiety. 10/26/19   Raiford Dean Latif, DO  magnesium oxide (MAG-OX) 400 MG tablet Take 200 mg by mouth 2 (two) times daily.    [provider]  sacubitril-valsartan (ENTRESTO) 49-51 MG Take 1 tablet by mouth 2 (two) times daily. 05/07/20   Hilty, Nadean Corwin, MD  sodium bicarbonate 650 MG tablet Take 1 tablet (650 mg total) by mouth 3 (three) times daily. 10/26/19   Raiford Dean Latif, DO  spironolactone (ALDACTONE) 25 MG tablet Take 1 tablet (25 mg total) by mouth daily. 01/23/20 04/22/20  Pixie Casino, MD  ZINC-VITAMIN C PO Take 1 tablet by mouth daily.    [provider]    Allergies    Contrast media [iodinated diagnostic agents] and Lisinopril  Review of Systems   Review of Systems  Constitutional: Negative for fever.  HENT: Negative for rhinorrhea and sore throat.   Eyes: Negative for redness.  Respiratory: Negative for cough and shortness of breath.   Cardiovascular: Negative for chest pain.  Gastrointestinal: Positive for diarrhea (improving). Negative for abdominal pain, nausea and vomiting.  Genitourinary: Negative for dysuria and hematuria.  Musculoskeletal: Negative for myalgias.  Skin: Negative for rash.  Neurological: Positive for weakness. Negative for headaches.    Physical Exam Updated Vital Signs BP (!) 152/58 (BP Location: Left Arm)   Pulse 65   Temp 97.7 F (36.5 C)   Resp 17   Ht 5\' 6"  (1.676 m)   Wt 110 kg   SpO2 100%   BMI 39.14 kg/m   Physical Exam Vitals and nursing note reviewed.  Constitutional:      Appearance: He is well-developed.  HENT:     Head: Normocephalic and atraumatic.     Mouth/Throat:     Mouth: Mucous membranes are moist.  Eyes:     General:        Right eye: No discharge.        Left eye: No  discharge.     Conjunctiva/sclera: Conjunctivae normal.  Cardiovascular:     Rate and Rhythm: Normal rate and regular rhythm.     Heart sounds: Normal heart sounds.  Pulmonary:     Effort: Pulmonary effort is normal.     Breath sounds: Normal breath sounds. No wheezing, rhonchi or rales.     Comments: Lungs clear on exam.  Abdominal:     Palpations: Abdomen is soft.     Tenderness: There is no abdominal tenderness. There is no guarding or rebound.  Musculoskeletal:        General: No swelling.     Cervical back: Normal range of motion and neck supple.     Right lower leg: No edema.     Left lower leg: No edema.     Comments: No pedal edema noted.   Skin:    General: Skin is warm and dry.  Neurological:     Mental Status: He is alert.     ED Results / Procedures / Treatments   Labs (all labs ordered are listed, but only abnormal results are displayed) Labs Reviewed  BASIC METABOLIC PANEL - Abnormal; Notable for the following components:      Result Value   Sodium 133 (*)    Chloride 95 (*)    BUN 43 (*)    Creatinine, Ser 2.18 (*)    GFR calc non Af Amer 30 (*)    GFR calc Af Amer 34 (*)    All other components within normal limits  CBC - Abnormal; Notable for the following components:   WBC 13.0 (*)    Hemoglobin 11.9 (*)    HCT 38.2 (*)    All other components within normal limits  SARS CORONAVIRUS 2 BY RT PCR [facility] ORDER, [facility])  TROPONIN I (HIGH SENSITIVITY)    EKG EKG Interpretation  Date/Time:  Wednesday June 02 2020 19:53:49 EDT Ventricular Rate:  71 PR Interval:  218 QRS Duration: 152 QT Interval:  454 QTC Calculation: 493 R Axis:   -24 Text Interpretation: Sinus rhythm with 1st degree A-V block with  occasional Premature ventricular complexes Left bundle branch block Abnormal ECG Confirmed by Aletta Edouard [phone]) on 06/03/2020 7:49:36 AM   Radiology DG Chest 2 View  Result Date: 06/02/2020 CLINICAL DATA:   Hypertension, bilateral lower extremity edema EXAM: CHEST - 2 VIEW COMPARISON:  11/15/2019 FINDINGS: Frontal and lateral views of the chest demonstrate stable single lead pacer. Cardiac silhouette is unremarkable. No airspace disease, effusion, or pneumothorax. Stable thoracic spondylosis. IMPRESSION: 1. No acute intrathoracic process. Electronically Signed   By: Randa Ngo M.D.   On: 06/02/2020 20:34    Procedures Procedures (including critical care time)  Medications Ordered in ED Medications  sodium chloride 0.9 % bolus 500 mL (500 mLs Intravenous New Bag/Given 06/03/20 0830)    ED Course  I have reviewed the triage vital signs and the nursing notes.  Pertinent labs & imaging results that were available during my care of the patient were reviewed by me and considered in my medical decision making (see chart for details).  Patient seen and examined. Reviewed previous work-up. AKI noted,  baseline 1.2-1.4. On exam, patient appears euvolemic. Will give small fluid bolus. Will discuss diuretic plan and follow-up with cardiology. With their approval, patient can likely go home with close follow-up. Discussed plan with Dr. Melina Copa, who will see.   Vital signs reviewed and are as follows: BP (!) 152/58 (BP Location: Left Arm)   Pulse 65   Temp 97.7 F (36.5 C)   Resp 17   Ht 5\' 6"  (1.676 m)   Wt 110 kg   SpO2 100%   BMI 39.14 kg/m   9:49 AM I spoke with Dr. Margaretann Loveless of cardiology earlier by phone.  She  agrees that patient can be discharged if he appears well and has close follow-up as an outpatient.  This would include repeat labs tomorrow and PCP follow-up on Monday.  Otherwise, they would opt to admit to the hospital for monitoring.   I was able to call the patient's primary care doctor, Dr. Mellody Drown, and speak with him directly.  He knows the patient well and is happy to get him into the office tomorrow.  Patient updated and he is in agreement/comfortable with this plan.  Patient  also notes that he has been up to the restroom in the past few minutes and states that he is feeling better than he was when he arrived.  Requesting something to eat and drink.  He is finishing up his IV fluids, anticipate discharge to home when these are complete.  We discussed that he should return to the emergency department with any worsening with strength or with his breathing.  He is willing to do this and agrees with plan.  10:40 AM Ready for discharge. COVID neg.     MDM Rules/Calculators/A&P                          Patient here with generalized weakness.  Work-up unremarkable except for acute kidney injury likely related to overdiuresis and recent strenuous work outside plus diarrhea illness.  Patient is tolerating p.o. fluids well.  Was given a small fluid bolus here.  He does not appear to be fluid overloaded.  Plan as above.  Appreciate cardiology and PCP input and willingness to follow-up closely.  Patient seems reliable to return if something changes or worsens.  We discussed plan to hold Lasix using teach back method multiple times.  Patient looks well, comfortable discharged home.  No signs of stroke.  Do not suspect ACS, sepsis.   Final Clinical Impression(s) / ED Diagnoses Final diagnoses:  Generalized weakness  Acute kidney injury [facility])    Rx / DC Orders ED Discharge Orders    None       Carlisle Cater, PA-C 06/03/20 1041    Hayden Rasmussen, MD 06/03/20 1825

## 2020-06-03 NOTE — ED Notes (Signed)
Patient verbalizes understanding of discharge instructions. Opportunity for questioning and answers were provided. Armband removed by staff, pt discharged from ED.  

## 2020-06-03 NOTE — ED Notes (Signed)
Patient ambulated to restroom independently with steady gait.

## 2020-06-07 ENCOUNTER — Ambulatory Visit (INDEPENDENT_AMBULATORY_CARE_PROVIDER_SITE_OTHER): Payer: Medicare (Managed Care)

## 2020-06-07 DIAGNOSIS — I5022 Chronic systolic (congestive) heart failure: Secondary | ICD-10-CM | POA: Diagnosis not present

## 2020-06-07 DIAGNOSIS — Z95810 Presence of automatic (implantable) cardiac defibrillator: Secondary | ICD-10-CM

## 2020-06-08 ENCOUNTER — Telehealth: Payer: Self-pay

## 2020-06-08 NOTE — Progress Notes (Addendum)
EPIC Encounter for ICM Monitoring  Patient Name: Dean Eisenberg Sr. is a 69 y.o. male Date: 06/08/2020 Primary Care Physican: [name], MD Primary Cardiologist:Hilty Electrophysiologist:Camnitz 05/13/2020 Office Weight: 243 lbs  AF 8 Time in AF 0.8 hr/day (3.3%)(taking Eliquis)  Attempted call to patient and unable to reach.   Transmission reviewed.   OptivolThoracic impedancenormal.  Prescribed:  Furosemide80 mgTake 80 mg by mouth twice a day (per PCP)  Spironolactone 25 mg take 1 tablet daily  Eliquis 5 mg take 1 tablet twice a day  Labs: 06/02/2020 Creatinine 2.18, BUN 43, Potassium 3.8, Sodium 133, GFR 30-34 01/23/2020 Creatinine 1.42, BUN 17, Potassium 4.0, Sodium 143, GFR 50-57 12/11/2019 Creatinine 1.40, BUN 21, Potassium 4.3, Sodium 143, GFR 51-58 A complete set of results can be found in Results Review.  Recommendations:Unable to reach.    Follow-up plan: ICM clinic phone appointment on10/18/2021. 91 day device clinic remote transmission 06/30/2020.   EP/Cardiology Office Visits:06/14/2020 with Almyra Deforest, PA.  Needs appt with Dr Curt Bears (last OV was 04/11/2019).  Copy of ICM check sent to [place]    3 month ICM trend: 06/07/2020    1 Year ICM trend:       Rosalene Billings, RN 06/08/2020 2:40 PM

## 2020-06-08 NOTE — Telephone Encounter (Signed)
Remote ICM transmission received.  Attempted call to patient regarding ICM remote transmission and mail box is full. 

## 2020-06-09 NOTE — Progress Notes (Signed)
Patient returned call and he is doing well.  He denies any fluid symptoms.  His Furosemide was decreased to 80 mg twice a day by PCP after his recent ER visit.  Current weight 241 lbs.  No changes and encouraged to call if experiencing any fluid symptoms.

## 2020-06-14 ENCOUNTER — Ambulatory Visit: Payer: Medicare (Managed Care) | Admitting: Physician Assistant

## 2020-06-14 ENCOUNTER — Encounter: Payer: Self-pay | Admitting: Physician Assistant

## 2020-06-14 ENCOUNTER — Other Ambulatory Visit: Payer: Self-pay

## 2020-06-14 VITALS — BP 152/68 | HR 60 | Temp 100.0°F | Ht 66.0 in | Wt 241.2 lb

## 2020-06-14 DIAGNOSIS — I480 Paroxysmal atrial fibrillation: Secondary | ICD-10-CM

## 2020-06-14 DIAGNOSIS — I10 Essential (primary) hypertension: Secondary | ICD-10-CM

## 2020-06-14 DIAGNOSIS — I2510 Atherosclerotic heart disease of native coronary artery without angina pectoris: Secondary | ICD-10-CM | POA: Diagnosis not present

## 2020-06-14 DIAGNOSIS — Z95810 Presence of automatic (implantable) cardiac defibrillator: Secondary | ICD-10-CM | POA: Diagnosis not present

## 2020-06-14 DIAGNOSIS — E119 Type 2 diabetes mellitus without complications: Secondary | ICD-10-CM

## 2020-06-14 DIAGNOSIS — I5022 Chronic systolic (congestive) heart failure: Secondary | ICD-10-CM | POA: Diagnosis not present

## 2020-06-14 DIAGNOSIS — E785 Hyperlipidemia, unspecified: Secondary | ICD-10-CM

## 2020-06-14 MED ORDER — SACUBITRIL-VALSARTAN 24-26 MG PO TABS: 1.0000 | ORAL_TABLET | Freq: Two times a day (BID) | ORAL | 3 refills | Status: DC

## 2020-06-14 NOTE — Patient Instructions (Signed)
Medication Instructions:   DECREASE Entresto to 24-26 mg 2 times a day *If you need a refill on your cardiac medications before your next appointment, please call your pharmacy*  Lab Work: Your physician recommends that you return for lab work in: 1 week  BMET  If you have labs (blood work) drawn today and your tests are completely normal, you will receive your results only by: [name] MyChart Message (if you have MyChart) OR . A paper copy in the mail If you have any lab test that is abnormal or we need to change your treatment, we will call you to review the results.  Testing/Procedures: NONE ordered at this time of appointment   Follow-Up: At [facility], you and your health needs are our priority.  As part of our continuing mission to provide you with exceptional heart care, we have created designated Provider Care Teams.  These Care Teams include your primary Cardiologist (physician) and Advanced Practice Providers (APPs -  Physician Assistants and Nurse Practitioners) who all work together to provide you with the care you need, when you need it.  Your next appointment:   2-4 week(s)  The format for your next appointment:   In Person  Provider:   Almyra Deforest, PA-C  Other Instructions

## 2020-06-14 NOTE — Progress Notes (Signed)
Cardiology Office Note:    Date:  06/16/2020   ID:  Dean Bones Sr., DOB 1950/12/20, MRN [id number]  PCP:  [name], MD  [place] Cardiologist:  Pixie Casino, MD  [facility] Electrophysiologist:  Will Meredith Leeds, MD   Referring MD: [name], MD   Chief Complaint  Patient presents with  . Follow-up    seen for Dr. Debara Pickett    History of Present Illness:    Dean Delbene Sr. is a 69 y.o. male with a hx of chronic systolic heart failure, h/o VF arrest s/p ICD, CAD, LBBB, PAF, HTN, HLD, DM II and OSA.  According to the previous note, he also has a LINQ monitor as well.  VF arrest occurred in 2017, cardiac catheterization at the time on 09/07/2016 showed EF 35 to 45%, 90% ostial RCA lesion, 100% mid to distal RCA occlusion, medical therapy was recommended.  Echocardiogram obtained on 08/28/2016 showed EF 35 to 40%.  Recent echocardiogram obtained on 05/05/2020 showed EF 30 to 35%, grade 1 DD, moderate MR, mild dilatation of the aorta measuring at 39 mm.  His valsartan has been switched to [place] instead.  Since the last visit, he presented to the ED on 06/03/2020 due to generalized weakness and elevated creatinine.  He was hydrated in the setting of dehydration.  He was told to stop his Lasix for 2 days before resuming.  Since his ED discharge, his PCP has reduced Lasix to 80 mg twice a day from the previous 80 mg 3 times a day.  Repeat blood work shows improving renal function, last creatinine was reportedly 1.8 a week ago.  He denies any chest pain or significant shortness of breath.  At this point, he is no longer taking the Entresto due to joint pain.  Interestingly enough, he never had joint pain with Entresto in the past however he had done at least 2 trials recently and in both cases, joint pain started after he started on Entresto.  I recommended restarting Entresto at a lower dose of 24-26 mg twice a day.  If he is able to tolerate this low-dose, we may rechallenge  him on a higher dose in the future.  He will need 1 week basic metabolic panel.  I plan to see the patient back in 2 to 4 weeks.   Past Medical History:  Diagnosis Date  . 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 Arthritis    "maybe in his spine" (09/05/2016)  . Asthma    pt. denies  . Cardiac arrest ([place]) 08/27/2016   REQUIRING CPR, SHOCK & MEDICATIONS  . CHF (congestive heart failure) ([place])   . Coronary artery disease    90% ostRCA stenosis and total occlusion of dRCA with L-->R collaterals 2017  . Dysrhythmia   . Gout   . High cholesterol   . Hypertension   . Ischemic cardiomyopathy   . LBBB (left bundle branch block)   . OSA on CPAP   . Type II diabetes mellitus ([place])     Past Surgical History:  Procedure Laterality Date  . CARDIAC CATHETERIZATION N/A 09/07/2016   Procedure: Left Heart Cath and Coronary Angiography;  Surgeon: Peter M Martinique, MD;  Location: [place] [facility];  Service: Cardiovascular;  Laterality: N/A;  . COLONOSCOPY WITH PROPOFOL N/A 08/30/2018   Procedure: COLONOSCOPY WITH PROPOFOL;  Surgeon: Carol Ada, MD;  Location: [facility];  Service: Endoscopy;  Laterality: N/A;  . EP IMPLANTABLE DEVICE N/A 10/10/2016   Procedure: ICD  Implant;  Surgeon: Will Meredith Leeds, MD;  Location: [place] [facility] [facility];  Service: Cardiovascular;  Laterality: N/A;  . LUMBAR LAMINECTOMY/DECOMPRESSION MICRODISCECTOMY N/A 01/18/2018   Procedure: L2-3, L3-4, L-4-5 CENTRAL DECOMPRESSION;  Surgeon: [name], MD;  Location: [facility];  Service: Orthopedics;  Laterality: N/A;  . POLYPECTOMY  08/30/2018   Procedure: POLYPECTOMY;  Surgeon: Carol Ada, MD;  Location: [facility];  Service: Endoscopy;;    Current Medications: Current Meds  Medication Sig  . allopurinol (ZYLOPRIM) 300 MG tablet Take 300 mg by mouth daily.  [name] amiodarone (PACERONE) 100 MG tablet TAKE 1 TABLET BY MOUTH EVERY DAY  . atorvastatin (LIPITOR) 40 MG tablet Take 1 tablet (40 mg  total) by mouth daily at 6 PM. (Patient taking differently: Take 40 mg by mouth daily at 12 noon. )  . carvedilol (COREG) 25 MG tablet Take 1 tablet (25 mg total) by mouth 2 (two) times daily with a meal.  . CVS VITAMIN D3 1000 units capsule Take 1,000 Units by mouth daily.  [name] ELIQUIS 5 MG TABS tablet TAKE 1 TABLET BY MOUTH TWICE A DAY (Patient taking differently: Take 5 mg by mouth 2 (two) times daily. )  . furosemide (LASIX) 80 MG tablet Take 80 mg by mouth in the morning, at noon, and at bedtime. Pt reports 06/09/20 PCP decreased to 80 mg bid.  . magnesium oxide (MAG-OX) 400 MG tablet Take 200 mg by mouth 2 (two) times daily.  . pantoprazole (PROTONIX) 40 MG tablet Take 40 mg by mouth daily.  . sodium bicarbonate 650 MG tablet Take 1 tablet (650 mg total) by mouth 3 (three) times daily.  [name] ZINC-VITAMIN C PO Take 1 tablet by mouth daily.     Allergies:   Contrast media [iodinated diagnostic agents] and Lisinopril   Social History   Socioeconomic History  . Marital status: Widowed    Spouse name: Not on file  . Number of children: Not on file  . Years of education: Not on file  . Highest education level: Not on file  Occupational History  . Not on file  Tobacco Use  . Smoking status: Never Smoker  . Smokeless tobacco: Never Used  Vaping Use  . Vaping Use: Never used  Substance and Sexual Activity  . Alcohol use: No  . Drug use: No  . Sexual activity: Not on file  Other Topics Concern  . Not on file  Social History Narrative  . Not on file   Social Determinants of Health   Financial Resource Strain:   . Difficulty of Paying Living Expenses: Not on file  Food Insecurity:   . Worried About Charity fundraiser in the Last Year: Not on file  . Ran Out of Food in the Last Year: Not on file  Transportation Needs:   . Lack of Transportation (Medical): Not on file  . Lack of Transportation (Non-Medical): Not on file  Physical Activity:   . Days of Exercise per Week: Not on file   . Minutes of Exercise per Session: Not on file  Stress:   . Feeling of Stress : Not on file  Social Connections:   . Frequency of Communication with Friends and Family: Not on file  . Frequency of Social Gatherings with Friends and Family: Not on file  . Attends Religious Services: Not on file  . Active Member of Clubs or Organizations: Not on file  . Attends Archivist Meetings: Not on file  . Marital Status:  Not on file     Family History: The patient's family history includes Cancer in his brother; Heart attack in his father; Hypertension in his sister and sister.  ROS:   Please see the history of present illness.     All other systems reviewed and are negative.  EKGs/Labs/Other Studies Reviewed:    The following studies were reviewed today:  Echo 05/05/2020 IMPRESSIONS    1. Left ventricular ejection fraction, by estimation, is 30 to 35%. The  left ventricle has moderately decreased function. The left ventricle  demonstrates global hypokinesis. There is mild left ventricular  hypertrophy. Left ventricular diastolic  parameters are consistent with Grade I diastolic dysfunction (impaired  relaxation).  2. Right ventricular systolic function is normal. The right ventricular  size is normal. There is mildly elevated pulmonary artery systolic  pressure.  3. Left atrial size was mildly dilated.  4. The mitral valve is normal in structure. Moderate mitral valve  regurgitation. No evidence of mitral stenosis.  5. The aortic valve is tricuspid. Aortic valve regurgitation is trivial.  Mild to moderate aortic valve sclerosis/calcification is present, without  any evidence of aortic stenosis.  6. Aortic dilatation noted. There is mild dilatation at the level of the  sinuses of Valsalva measuring 39 mm.  7. The inferior vena cava is normal in size with greater than 50%  respiratory variability, suggesting right atrial pressure of 3 mmHg.   Comparison(s): No  significant change from prior study. Prior images  reviewed side by side.   EKG:  EKG is not ordered today.   Recent Labs: 10/26/2019: Magnesium 2.0 01/23/2020: ALT 18; BNP 318.7 06/02/2020: BUN 43; Creatinine, Ser 2.18; Hemoglobin 11.9; Platelets 209; Potassium 3.8; Sodium 133  Recent Lipid Panel    Component Value Date/Time   CHOL 159 09/07/2016 0844   TRIG 146 09/07/2016 0844   HDL 25 (L) 09/07/2016 0844   CHOLHDL 6.4 09/07/2016 0844   VLDL 29 09/07/2016 0844   LDLCALC 105 (H) 09/07/2016 0844    Physical Exam:    VS:  BP (!) 152/68   Pulse 60   Temp 100 F (37.8 C)   Ht 5\' 6"  (1.676 m)   Wt 241 lb 3.2 oz (109.4 kg)   BMI 38.93 kg/m     Wt Readings from Last 3 Encounters:  06/14/20 241 lb 3.2 oz (109.4 kg)  06/02/20 242 lb 8.1 oz (110 kg)  05/13/20 243 lb (110.2 kg)     GEN:  Well nourished, well developed in no acute distress HEENT: Normal NECK: No JVD; No carotid bruits LYMPHATICS: No lymphadenopathy CARDIAC: RRR, no murmurs, rubs, gallops RESPIRATORY:  Clear to auscultation without rales, wheezing or rhonchi  ABDOMEN: Soft, non-tender, non-distended MUSCULOSKELETAL:  No edema; No deformity  SKIN: Warm and dry NEUROLOGIC:  Alert and oriented x 3 PSYCHIATRIC:  Normal affect   ASSESSMENT:    1. Chronic systolic heart failure ([place])   2. ICD (implantable cardioverter-defibrillator) in place   3. Coronary artery disease involving native coronary artery of native heart without angina pectoris   4. PAF (paroxysmal atrial fibrillation) ([place])   5. Essential hypertension   6. Hyperlipidemia LDL goal <70   7. Controlled type 2 diabetes mellitus without complication, without long-term current use of insulin (HCC)    PLAN:    In order of problems listed above:  1. Chronic systolic heart failure: He has self discontinued Entresto due to joint ache.  I am not entirely clear if Dean [name] is responsible for  this symptom although patient seems to be quite confident about  the correlation as he has tried Entresto twice and he had recurrent joint pain associated with the medication.  We will need to review his medication bottles on the next visit to make sure he is not referring to the statin.  I did advise him to take half of the current dose of Entresto to see if he can tolerate it.  2. History of VF arrest s/p ICD: Occurred in 2017, no recent recurrence  3. CAD: Previous cardiac catheterization in 2017 showed occluded RCA, otherwise nonobstructive disease, medical therapy was recommended.  He is not on aspirin given the need for Eliquis.  4. PAF: On Eliquis and carvedilol  5. Hypertension: Blood pressure elevated, restart Entresto at 24-26 mg twice a day.  6. Hyperlipidemia: Continue Lipitor  7. DM2: Managed by primary care provider   Medication Adjustments/Labs and Tests Ordered: Current medicines are reviewed at length with the patient today.  Concerns regarding medicines are outlined above.  Orders Placed This Encounter  Procedures  . Basic metabolic panel   Meds ordered this encounter  Medications  . sacubitril-valsartan (ENTRESTO) 24-26 MG    Sig: Take 1 tablet by mouth 2 (two) times daily.    Dispense:  180 tablet    Refill:  3    Patient Instructions  Medication Instructions:   DECREASE Entresto to 24-26 mg 2 times a day *If you need a refill on your cardiac medications before your next appointment, please call your pharmacy*  Lab Work: Your physician recommends that you return for lab work in: 1 week  BMET  If you have labs (blood work) drawn today and your tests are completely normal, you will receive your results only by: [name] MyChart Message (if you have MyChart) OR . A paper copy in the mail If you have any lab test that is abnormal or we need to change your treatment, we will call you to review the results.  Testing/Procedures: NONE ordered at this time of appointment   Follow-Up: At [facility], you and your health needs  are our priority.  As part of our continuing mission to provide you with exceptional heart care, we have created designated Provider Care Teams.  These Care Teams include your primary Cardiologist (physician) and Advanced Practice Providers (APPs -  Physician Assistants and Nurse Practitioners) who all work together to provide you with the care you need, when you need it.  Your next appointment:   2-4 week(s)  The format for your next appointment:   In Person  Provider:   Almyra Deforest, PA-C  Other Instructions      Signed, Dean [name], [name]  06/16/2020 11:26 PM    [place]

## 2020-06-15 ENCOUNTER — Encounter: Payer: Self-pay | Admitting: Physician Assistant

## 2020-06-29 ENCOUNTER — Emergency Department (HOSPITAL_COMMUNITY): Payer: Medicare (Managed Care)

## 2020-06-29 ENCOUNTER — Observation Stay (HOSPITAL_COMMUNITY)
Admission: EM | Admit: 2020-06-29 | Discharge: 2020-07-01 | Disposition: A | Discharge status: Home / Self Care | Payer: Medicare (Managed Care) | Attending: Surgery | Admitting: Surgery

## 2020-06-29 ENCOUNTER — Other Ambulatory Visit: Payer: Self-pay

## 2020-06-29 ENCOUNTER — Encounter (HOSPITAL_COMMUNITY): Payer: Self-pay

## 2020-06-29 DIAGNOSIS — Z7901 Long term (current) use of anticoagulants: Secondary | ICD-10-CM | POA: Insufficient documentation

## 2020-06-29 DIAGNOSIS — S3991XA Unspecified injury of abdomen, initial encounter: Secondary | ICD-10-CM | POA: Diagnosis not present

## 2020-06-29 DIAGNOSIS — S24104A Unspecified injury at T11-T12 level of thoracic spinal cord, initial encounter: Secondary | ICD-10-CM | POA: Diagnosis present

## 2020-06-29 DIAGNOSIS — E119 Type 2 diabetes mellitus without complications: Secondary | ICD-10-CM | POA: Diagnosis not present

## 2020-06-29 DIAGNOSIS — Y9389 Activity, other specified: Secondary | ICD-10-CM | POA: Diagnosis not present

## 2020-06-29 DIAGNOSIS — N183 Chronic kidney disease, stage 3 unspecified: Secondary | ICD-10-CM | POA: Insufficient documentation

## 2020-06-29 DIAGNOSIS — S0990XA Unspecified injury of head, initial encounter: Secondary | ICD-10-CM | POA: Diagnosis not present

## 2020-06-29 DIAGNOSIS — S22089A Unspecified fracture of T11-T12 vertebra, initial encounter for closed fracture: Secondary | ICD-10-CM | POA: Diagnosis not present

## 2020-06-29 DIAGNOSIS — I130 Hypertensive heart and chronic kidney disease with heart failure and stage 1 through stage 4 chronic kidney disease, or unspecified chronic kidney disease: Secondary | ICD-10-CM | POA: Insufficient documentation

## 2020-06-29 DIAGNOSIS — J45909 Unspecified asthma, uncomplicated: Secondary | ICD-10-CM | POA: Diagnosis not present

## 2020-06-29 DIAGNOSIS — Z79899 Other long term (current) drug therapy: Secondary | ICD-10-CM | POA: Insufficient documentation

## 2020-06-29 DIAGNOSIS — S22050A Wedge compression fracture of T5-T6 vertebra, initial encounter for closed fracture: Secondary | ICD-10-CM | POA: Diagnosis present

## 2020-06-29 DIAGNOSIS — Z20822 Contact with and (suspected) exposure to covid-19: Secondary | ICD-10-CM | POA: Insufficient documentation

## 2020-06-29 DIAGNOSIS — I5042 Chronic combined systolic (congestive) and diastolic (congestive) heart failure: Secondary | ICD-10-CM | POA: Diagnosis not present

## 2020-06-29 DIAGNOSIS — Y92410 Unspecified street and highway as the place of occurrence of the external cause: Secondary | ICD-10-CM | POA: Insufficient documentation

## 2020-06-29 DIAGNOSIS — S22009A Unspecified fracture of unspecified thoracic vertebra, initial encounter for closed fracture: Secondary | ICD-10-CM | POA: Diagnosis present

## 2020-06-29 DIAGNOSIS — S01512A Laceration without foreign body of oral cavity, initial encounter: Secondary | ICD-10-CM

## 2020-06-29 LAB — CBC WITH DIFFERENTIAL/PLATELET
Abs Immature Granulocytes: 0.29 10*3/uL — ABNORMAL HIGH (ref 0.00–0.07)
Basophils Absolute: 0.1 10*3/uL (ref 0.0–0.1)
Basophils Relative: 0 %
Eosinophils Absolute: 0.1 10*3/uL (ref 0.0–0.5)
Eosinophils Relative: 0 %
HCT: 37.2 % — ABNORMAL LOW (ref 39.0–52.0)
Hemoglobin: 11.6 g/dL — ABNORMAL LOW (ref 13.0–17.0)
Immature Granulocytes: 1 %
Lymphocytes Relative: 6 %
Lymphs Abs: 1.4 10*3/uL (ref 0.7–4.0)
MCH: 28.1 pg (ref 26.0–34.0)
MCHC: 31.2 g/dL (ref 30.0–36.0)
MCV: 90.1 fL (ref 80.0–100.0)
Monocytes Absolute: 1.2 10*3/uL — ABNORMAL HIGH (ref 0.1–1.0)
Monocytes Relative: 5 %
Neutro Abs: 19.8 10*3/uL — ABNORMAL HIGH (ref 1.7–7.7)
Neutrophils Relative %: 88 %
Platelets: 232 10*3/uL (ref 150–400)
RBC: 4.13 MIL/uL — ABNORMAL LOW (ref 4.22–5.81)
RDW: 14.6 % (ref 11.5–15.5)
WBC: 22.9 10*3/uL — ABNORMAL HIGH (ref 4.0–10.5)
nRBC: 0 % (ref 0.0–0.2)

## 2020-06-29 LAB — COMPREHENSIVE METABOLIC PANEL
ALT: 21 U/L (ref 0–44)
AST: 46 U/L — ABNORMAL HIGH (ref 15–41)
Albumin: 4.1 g/dL (ref 3.5–5.0)
Alkaline Phosphatase: 112 U/L (ref 38–126)
Anion gap: 14 (ref 5–15)
BUN: 29 mg/dL — ABNORMAL HIGH (ref 8–23)
CO2: 24 mmol/L (ref 22–32)
Calcium: 10.4 mg/dL — ABNORMAL HIGH (ref 8.9–10.3)
Chloride: 99 mmol/L (ref 98–111)
Creatinine, Ser: 2.23 mg/dL — ABNORMAL HIGH (ref 0.61–1.24)
GFR calc non Af Amer: 29 mL/min — ABNORMAL LOW (ref >60)
Glucose, Bld: 143 mg/dL — ABNORMAL HIGH (ref 70–99)
Potassium: 4 mmol/L (ref 3.5–5.1)
Sodium: 137 mmol/L (ref 135–145)
Total Bilirubin: 1.2 mg/dL (ref 0.3–1.2)
Total Protein: 8.2 g/dL — ABNORMAL HIGH (ref 6.5–8.1)

## 2020-06-29 LAB — I-STAT CHEM 8, ED
BUN: 31 mg/dL — ABNORMAL HIGH (ref 8–23)
Calcium, Ion: 1.21 mmol/L (ref 1.15–1.40)
Chloride: 101 mmol/L (ref 98–111)
Creatinine, Ser: 2.1 mg/dL — ABNORMAL HIGH (ref 0.61–1.24)
Glucose, Bld: 147 mg/dL — ABNORMAL HIGH (ref 70–99)
HCT: 38 % — ABNORMAL LOW (ref 39.0–52.0)
Hemoglobin: 12.9 g/dL — ABNORMAL LOW (ref 13.0–17.0)
Potassium: 4 mmol/L (ref 3.5–5.1)
Sodium: 138 mmol/L (ref 135–145)
TCO2: 25 mmol/L (ref 22–32)

## 2020-06-29 LAB — HIV ANTIBODY (ROUTINE TESTING W REFLEX): HIV Screen 4th Generation wRfx: NONREACTIVE

## 2020-06-29 MED ORDER — FUROSEMIDE 80 MG PO TABS
80.0000 mg | ORAL_TABLET | Freq: Every day | ORAL | Status: DC
  Administered 2020-06-30 – 2020-07-01 (×2): 80 mg via ORAL
  Filled 2020-06-29: qty 4
  Filled 2020-06-29: qty 1

## 2020-06-29 MED ORDER — ACETAMINOPHEN 325 MG PO TABS
650.0000 mg | ORAL_TABLET | Freq: Four times a day (QID) | ORAL | Status: DC
  Administered 2020-06-29 – 2020-06-30 (×2): 650 mg via ORAL
  Filled 2020-06-29 (×2): qty 2

## 2020-06-29 MED ORDER — HYDROMORPHONE HCL 1 MG/ML IJ SOLN
0.5000 mg | Freq: Once | INTRAMUSCULAR | Status: AC
  Administered 2020-06-29: 0.5 mg via INTRAVENOUS
  Filled 2020-06-29: qty 1

## 2020-06-29 MED ORDER — ONDANSETRON HCL 4 MG/2ML IJ SOLN: 4.0000 mg | Freq: Four times a day (QID) | INTRAMUSCULAR | Status: DC | PRN

## 2020-06-29 MED ORDER — DOCUSATE SODIUM 100 MG PO CAPS
100.0000 mg | ORAL_CAPSULE | Freq: Two times a day (BID) | ORAL | Status: DC
  Administered 2020-06-29 – 2020-07-01 (×4): 100 mg via ORAL
  Filled 2020-06-29 (×4): qty 1

## 2020-06-29 MED ORDER — METOPROLOL TARTRATE 5 MG/5ML IV SOLN: 5.0000 mg | Freq: Four times a day (QID) | INTRAVENOUS | Status: DC | PRN

## 2020-06-29 MED ORDER — METHOCARBAMOL 500 MG PO TABS
500.0000 mg | ORAL_TABLET | Freq: Four times a day (QID) | ORAL | Status: DC | PRN
  Administered 2020-06-30: 500 mg via ORAL
  Filled 2020-06-29: qty 1

## 2020-06-29 MED ORDER — MAGNESIUM OXIDE 400 (241.3 MG) MG PO TABS
200.0000 mg | ORAL_TABLET | Freq: Two times a day (BID) | ORAL | Status: DC
  Administered 2020-06-29 – 2020-07-01 (×4): 200 mg via ORAL
  Filled 2020-06-29 (×4): qty 1

## 2020-06-29 MED ORDER — GABAPENTIN 300 MG PO CAPS
300.0000 mg | ORAL_CAPSULE | Freq: Three times a day (TID) | ORAL | Status: DC
  Administered 2020-06-29 – 2020-07-01 (×5): 300 mg via ORAL
  Filled 2020-06-29 (×6): qty 1

## 2020-06-29 MED ORDER — CARVEDILOL 25 MG PO TABS
25.0000 mg | ORAL_TABLET | Freq: Two times a day (BID) | ORAL | Status: DC
  Administered 2020-06-30 – 2020-07-01 (×3): 25 mg via ORAL
  Filled 2020-06-29 (×2): qty 1
  Filled 2020-06-29: qty 2

## 2020-06-29 MED ORDER — OXYCODONE-ACETAMINOPHEN 5-325 MG PO TABS
1.0000 | ORAL_TABLET | ORAL | Status: DC | PRN
  Administered 2020-06-29: 1 via ORAL
  Filled 2020-06-29: qty 1

## 2020-06-29 MED ORDER — HYDROMORPHONE HCL 1 MG/ML IJ SOLN
0.5000 mg | INTRAMUSCULAR | Status: DC | PRN
  Administered 2020-06-30: 0.5 mg via INTRAVENOUS
  Filled 2020-06-29: qty 1

## 2020-06-29 MED ORDER — SODIUM BICARBONATE 650 MG PO TABS: 650.0000 mg | ORAL_TABLET | Freq: Three times a day (TID) | ORAL | Status: DC

## 2020-06-29 MED ORDER — PANTOPRAZOLE SODIUM 40 MG PO TBEC
40.0000 mg | DELAYED_RELEASE_TABLET | Freq: Every day | ORAL | Status: DC
  Administered 2020-06-30 – 2020-07-01 (×2): 40 mg via ORAL
  Filled 2020-06-29 (×2): qty 1

## 2020-06-29 MED ORDER — OXYCODONE HCL 5 MG PO TABS
5.0000 mg | ORAL_TABLET | ORAL | Status: DC | PRN
  Administered 2020-06-30: 5 mg via ORAL
  Filled 2020-06-29: qty 1

## 2020-06-29 MED ORDER — ONDANSETRON 4 MG PO TBDP: 4.0000 mg | ORAL_TABLET | Freq: Four times a day (QID) | ORAL | Status: DC | PRN

## 2020-06-29 MED ORDER — HYDRALAZINE HCL 20 MG/ML IJ SOLN: 10.0000 mg | INTRAMUSCULAR | Status: DC | PRN

## 2020-06-29 MED ORDER — AMIODARONE HCL 200 MG PO TABS
100.0000 mg | ORAL_TABLET | Freq: Every day | ORAL | Status: DC
  Administered 2020-06-30 – 2020-07-01 (×2): 100 mg via ORAL
  Filled 2020-06-29 (×2): qty 1

## 2020-06-29 MED ORDER — SODIUM CHLORIDE 0.9 % IV SOLN: INTRAVENOUS | Status: DC

## 2020-06-29 MED ORDER — SPIRONOLACTONE 25 MG PO TABS
25.0000 mg | ORAL_TABLET | Freq: Every day | ORAL | Status: DC
  Administered 2020-06-30 – 2020-07-01 (×2): 25 mg via ORAL
  Filled 2020-06-29 (×3): qty 1
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
## 2020-06-29 NOTE — ED Notes (Signed)
Pt repositioned and given water per MD
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
## 2020-06-29 NOTE — Progress Notes (Signed)
Orthopedic Tech Progress Note Patient Details:  Dean Magowan Sr. [birth year] [id number] Applied TLSO brace to patient Patient ID: Dean Bones Sr., male   DOB: 07/09/1951, 69 y.o.   MRN: [id number]   Tammy Sours 06/29/2020, 9:56 PM

## 2020-06-29 NOTE — ED Notes (Signed)
Called Ortho Tech to get TLSO brace for patient.
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
## 2020-06-30 LAB — CBC
HCT: 29.4 % — ABNORMAL LOW (ref 39.0–52.0)
HCT: 30.9 % — ABNORMAL LOW (ref 39.0–52.0)
Hemoglobin: 9.4 g/dL — ABNORMAL LOW (ref 13.0–17.0)
Hemoglobin: 9.9 g/dL — ABNORMAL LOW (ref 13.0–17.0)
MCH: 29.2 pg (ref 26.0–34.0)
MCH: 28.9 pg (ref 26.0–34.0)
MCHC: 32 g/dL (ref 30.0–36.0)
MCHC: 32 g/dL (ref 30.0–36.0)
MCV: 91.3 fL (ref 80.0–100.0)
MCV: 90.4 fL (ref 80.0–100.0)
Platelets: 172 10*3/uL (ref 150–400)
Platelets: 150 10*3/uL (ref 150–400)
RBC: 3.22 MIL/uL — ABNORMAL LOW (ref 4.22–5.81)
RBC: 3.42 MIL/uL — ABNORMAL LOW (ref 4.22–5.81)
RDW: 14.7 % (ref 11.5–15.5)
RDW: 14.6 % (ref 11.5–15.5)
WBC: 19.3 10*3/uL — ABNORMAL HIGH (ref 4.0–10.5)
WBC: 17.9 10*3/uL — ABNORMAL HIGH (ref 4.0–10.5)
nRBC: 0 % (ref 0.0–0.2)
nRBC: 0 % (ref 0.0–0.2)

## 2020-06-30 LAB — BASIC METABOLIC PANEL
Anion gap: 12 (ref 5–15)
BUN: 34 mg/dL — ABNORMAL HIGH (ref 8–23)
CO2: 24 mmol/L (ref 22–32)
Calcium: 9.7 mg/dL (ref 8.9–10.3)
Chloride: 100 mmol/L (ref 98–111)
Creatinine, Ser: 2.1 mg/dL — ABNORMAL HIGH (ref 0.61–1.24)
GFR calc non Af Amer: 31 mL/min — ABNORMAL LOW (ref >60)
Glucose, Bld: 121 mg/dL — ABNORMAL HIGH (ref 70–99)
Potassium: 4.3 mmol/L (ref 3.5–5.1)
Sodium: 136 mmol/L (ref 135–145)

## 2020-06-30 LAB — RESPIRATORY PANEL BY RT PCR (FLU A&B, COVID)
Influenza A by PCR: NEGATIVE
Influenza B by PCR: NEGATIVE
SARS Coronavirus 2 by RT PCR: NEGATIVE

## 2020-06-30 MED ORDER — LIDOCAINE 5 % EX PTCH
1.0000 | MEDICATED_PATCH | CUTANEOUS | Status: DC
  Administered 2020-06-30: 1 via TRANSDERMAL
  Filled 2020-06-30: qty 1

## 2020-06-30 MED ORDER — ACETAMINOPHEN 500 MG PO TABS
1000.0000 mg | ORAL_TABLET | Freq: Four times a day (QID) | ORAL | Status: DC
  Administered 2020-06-30 – 2020-07-01 (×4): 1000 mg via ORAL
  Filled 2020-06-30 (×4): qty 2

## 2020-06-30 MED ORDER — APIXABAN 5 MG PO TABS
5.0000 mg | ORAL_TABLET | Freq: Two times a day (BID) | ORAL | Status: DC
  Administered 2020-07-01: 5 mg via ORAL
  Filled 2020-06-30: qty 1

## 2020-06-30 MED ORDER — IBUPROFEN 200 MG PO TABS: 600.0000 mg | ORAL_TABLET | Freq: Three times a day (TID) | ORAL | Status: DC

## 2020-06-30 MED ORDER — OXYCODONE HCL 5 MG PO TABS
5.0000 mg | ORAL_TABLET | ORAL | Status: DC | PRN
  Administered 2020-06-30 – 2020-07-01 (×3): 5 mg via ORAL
  Filled 2020-06-30 (×3): qty 1

## 2020-06-30 MED ORDER — METHOCARBAMOL 500 MG PO TABS
500.0000 mg | ORAL_TABLET | Freq: Four times a day (QID) | ORAL | Status: DC
  Administered 2020-06-30 – 2020-07-01 (×4): 500 mg via ORAL
  Filled 2020-06-30 (×4): qty 1
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
## 2020-06-30 NOTE — Care Management Obs Status (Signed)
[place] NOTIFICATION   Patient Details  Name: Dean [name]. MRN: [id number] Date of Birth: 08-10-1951   Medicare Observation Status Notification Given:       Ella Bodo, RN 06/30/2020, 2:57 PM
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
## 2020-06-30 NOTE — TOC CAGE-AID Note (Signed)
Transition of Care (TOC) - CAGE-AID Screening   Patient Details  Name: Dean [name]. MRN: [id number] Date of Birth: 07/10/1951  Transition of Care [facility]) CM/SW Contact:    Emeterio Reeve, [place] Phone Number: 06/30/2020, 4:11 PM   Clinical Narrative:  CSW spoke with pt via phone. CSW introduced self and explained her role at the hospital.  PT denies alcohol use and substance use. Pt did not need any resources at this time.    CAGE-AID Screening:    Have You Ever Felt You Ought to Cut Down on Your Drinking or Drug Use?: No Have People Annoyed You By Critizing Your Drinking Or Drug Use?: No Have You Felt Bad Or Guilty About Your Drinking Or Drug Use?: No Have You Ever Had a Drink or Used Drugs First Thing In The Morning to Steady Your Nerves or to Get Rid of a Hangover?: No CAGE-AID Score: 0  Substance Abuse Education Offered: Yes    Blima Ledger, [place] Social Worker [phone]

## 2020-07-01 MED ORDER — METHOCARBAMOL 500 MG PO TABS: 500.0000 mg | ORAL_TABLET | Freq: Four times a day (QID) | ORAL | 0 refills | Status: DC | PRN

## 2020-07-01 MED ORDER — OXYCODONE HCL 5 MG PO TABS
5.0000 mg | ORAL_TABLET | ORAL | 0 refills | Status: DC | PRN
Start: 2020-07-01 — End: 2020-08-04

## 2020-07-01 MED ORDER — ACETAMINOPHEN 500 MG PO TABS
1000.0000 mg | ORAL_TABLET | Freq: Four times a day (QID) | ORAL | 0 refills | Status: DC | PRN
Start: 2020-07-01 — End: 2021-02-08

## 2020-07-01 NOTE — Progress Notes (Signed)
Still with significant back pain with attempts at movement. No neurologic symptoms. No other problems.  Continue efforts at mobilization and pain control.

## 2020-07-01 NOTE — Discharge Instructions (Signed)
How to Use a Back Brace  A back brace is a form-fitting device that wraps around your trunk to support your lower back, abdomen, and hips. You may need to wear a back brace to relieve back pain or to correct a medical condition related to the back, such as abnormal curvature of the spine (scoliosis). A back brace can maintain or correct the shape of the spine and prevent a spinal problem from getting worse. A back brace can also take pressure off the layers of tissue (disks) between the bones of the spine (vertebrae). You may need a back brace to keep your back and spine in place while you heal from an injury or recover from surgery. Back braces can be either plastic (rigid brace) or soft elastic (dynamic brace).  A rigid brace usually covers both the front and back of the entire upper body.  A soft brace may cover only the lower back and abdomen and may fasten with self-adhesive elastic straps. Your health care provider will recommend the proper brace for your needs and medical condition. What are the risks?  A back brace may not help if you do not wear it as directed by your health care provider. Be sure to wear the brace exactly as instructed in order to prevent further back problems.  Wearing the brace may be uncomfortable at first. You may have trouble sleeping with it on. It may also be hard for you to do certain activities while wearing it.  If the back brace is not worn as instructed, it may cause rubbing and pressure on your skin and may cause sores. How to use a back brace Different types of braces will have different instructions for use. Follow instructions from your health care provider about:  How to put on the brace.  When and how often to wear the brace. In some cases, braces may need to be worn for long stretches of time. For example, a brace may need to be worn for 16-23 hours a day when used for scoliosis.  How to take off the brace.  Any safety tips you should follow when  wearing the brace. This may include: ? Move carefully while wearing the brace. The brace restricts your movement and could lead to additional injuries. ? Use a cane or walker for support if you feel unsteady. ? Sit in high, firm chairs. It may be difficult to stand up from low, soft chairs. ? Check the skin around the brace every day. Tell your health care provider about any concerns. How to care for a back brace  Do not let the back brace get wet. Typically, you will remove the brace for bathing and then put it back on afterward.  If you have a rigid brace, be sure to store it in a safe place when you are not wearing it. This will help to prevent damage.  Clean or wash the back brace with mild soap and water as told by your health care provider. Contact a health care provider if:  Your brace gets damaged.  You have pain or discomfort when wearing the back brace.  Your back pain is getting worse or is not improving over time.  You notice redness or skin breakdown from wearing the brace. Summary  You may need to wear a back brace to relieve back pain or to correct a medical condition related to the back, such as abnormal curvature of the spine (scoliosis).  Follow instructions as told by your health  care provider about how to use the brace and how to take care of it.  A back brace may not help if you do not wear it as directed by your health care provider. Wear the brace exactly as instructed in order to prevent further back problems.  Move carefully while wearing the brace.  Contact a health care provider if you have pain or discomfort when wearing the back brace or your back pain is getting worse or is not improving over time. This information is not intended to replace advice given to you by your health care provider. Make sure you discuss any questions you have with your health care provider. Document Revised: 08/07/2018 Document Reviewed: 08/07/2018 Elsevier Patient Education   2020 [employer].   Thoracic Spine Fracture A thoracic spine fracture is a break in one of the bones of the middle part of the back. The fracture can be mild or very bad. The most serious types cause the broken bones to:  Move out of place (unstable).  Damage or press on the main nerve in the spine (spinal cord). In some cases, the bone that connects to the lower part of the back may also have a break (thoracolumbar fracture). What are the causes? This condition may be caused by:  A car accident.  A fall.  A sports accident.  Violent acts. These include assaults or gunshots. What are the signs or symptoms? Symptoms may include:  Back pain.  Trouble standing or walking.  Numbness.  Tingling.  Weakness.  Loss of movement.  Being unable to control when to pee or poop (incontinence). How is this treated? Treatment may include:  Medicines.  A cast or a brace.  Physical therapy.  Surgery. This may be needed for very bad fractures. Follow these instructions at home: Medicines  Take medicines only as told by your doctor.  Do not drive or use heavy machinery while taking pain medicine.  To prevent or treat trouble pooping (constipation) while you are taking prescription pain medicine, your doctor may recommend that you: ? Drink enough fluid to keep your pee (urine) pale yellow. ? Take over-the-counter or prescription medicines. ? Eat foods that are high in fiber. This includes fresh fruits and vegetables, whole grains, and beans. ? Limit foods that are high in fat and processed sugars. This includes fried or sweet foods. If you have a brace:  Wear the back brace as told by your doctor. Remove it only as told by your doctor.  Keep the brace clean.  If the brace is not waterproof: ? Do not let it get wet. ? Cover it with a watertight covering when you take a bath or a shower. Activity  Stay in bed (on bed rest) only as told by your doctor.  Ask your doctor  what is safe for you to do.  Return to your normal activities as told by your doctor.  Do back exercises (physical therapy) as told by your doctor.  Exercise often as told by your doctor. Managing pain, stiffness, and swelling   If told, put ice on the injured area: ? Put ice in a plastic bag. ? Place a towel between your skin and the bag. ? Leave the ice on for 20 minutes, 2-3 times a day. General instructions  Do not use any products that contain nicotine or tobacco, such as cigarettes and e-cigarettes. If you need help quitting, ask your doctor.  Do not drink alcohol.  Keep all follow-up visits as told by  your doctor. This is important. Contact a doctor if:  You have a fever.  You have a cough that makes your pain worse.  Your pain medicine is not helping.  Your pain does not get better over time.  You cannot return to your normal activities as planned. Get help right away if:  Your pain is bad and it suddenly gets worse.  You are not able to move any part of your body (paralysis) that is below the level of your injury.  You have numbness, tingling, or weakness in any part of your body that is below the level of your injury.  You cannot control when you pee (urinate) or when you poop (pass stool). Summary  A thoracic spine fracture is a break in one of the bones of the middle part of the back.  A stable fracture can be treated with a back brace, activity restrictions, pain medicine, and physical therapy. A more severe fracture may require surgery.  Make sure you know what symptoms should cause you to get help right away. This information is not intended to replace advice given to you by your health care provider. Make sure you discuss any questions you have with your health care provider. Document Revised: 10/26/2017 Document Reviewed: 10/26/2017 Elsevier Patient Education  2020 [employer].
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
## 2020-07-01 NOTE — Progress Notes (Signed)
PT d/c via wheelchair, provided with AVS, d/c home via private vehicle with daughter with information on where to pick up prescriptions

## 2020-07-07 ENCOUNTER — Encounter: Payer: Medicare (Managed Care) | Admitting: Physician Assistant

## 2020-07-09 NOTE — Progress Notes (Signed)
This encounter was created in error - please disregard.

## 2020-07-15 ENCOUNTER — Telehealth: Payer: Self-pay
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
## 2020-07-15 NOTE — Telephone Encounter (Signed)
LMOVM for pt to send missed transmission for 07/12/2020. Left direct office number for pt to call if he has any question.

## 2020-07-16 NOTE — Progress Notes (Signed)
No ICM remote transmission received for 07/12/2020 and next ICM transmission scheduled for 08/02/2020.   

## 2020-07-16 NOTE — Telephone Encounter (Signed)
I think this accidentally got sent to pre-op pool. I do not see any clearance form for this patient. Will route message to [place], who initially called patient about missed transmission.   Thank you! Torell Minder

## 2020-07-21 ENCOUNTER — Ambulatory Visit (INDEPENDENT_AMBULATORY_CARE_PROVIDER_SITE_OTHER): Payer: Medicare (Managed Care)

## 2020-07-21 DIAGNOSIS — I5022 Chronic systolic (congestive) heart failure: Secondary | ICD-10-CM | POA: Diagnosis not present

## 2020-07-21 DIAGNOSIS — Z95810 Presence of automatic (implantable) cardiac defibrillator: Secondary | ICD-10-CM

## 2020-07-26 NOTE — Progress Notes (Signed)
EPIC Encounter for ICM Monitoring  Patient Name: Dean Sherk Sr. is a 69 y.o. male Date: 07/26/2020 Primary Care Physican: [name], MD Primary Cardiologist:Hilty Electrophysiologist:Camnitz 05/13/2020 Office Weight: 243 lbs  Time in AF        0.0 hr/day (0.0%) (taking Eliquis)  Transmission reviewed.  OptivolThoracic impedancenormal.  Prescribed:  Furosemide80 mgTake 80 mg by mouth twice a day (per PCP)  Spironolactone 25 mg take 1 tablet daily  Eliquis 5 mg take 1 tablet twice a day  Labs: 06/30/2020 Creatinine 2.10, BUN 34, Potassium 4.3, Sodium 136, GFR 31 06/29/2020 Creatinine 2.10, BUN 31, Potassium 4.0, Sodium 138, GFR 29  06/02/2020 Creatinine 2.18, BUN 43, Potassium 3.8, Sodium 133, GFR 30-34 01/23/2020 Creatinine 1.42, BUN 17, Potassium 4.0, Sodium 143, GFR 50-57 12/11/2019 Creatinine 1.40, BUN 21, Potassium 4.3, Sodium 143, GFR 51-58 A complete set of results can be found in Results Review.  Recommendations:No changes  Follow-up plan: ICM clinic phone appointment on 08/23/2020. 91 day device clinic remote transmission 09/29/2020.   EP/Cardiology Office Visits: Recall for 10/03/2020 with Dr Curt Bears   Copy of ICM check sent to [facility]   3 month ICM trend: 07/21/2020    1 Year ICM trend:       Rosalene Billings, RN 07/26/2020 12:05 PM

## 2020-07-28 ENCOUNTER — Telehealth: Payer: Self-pay | Admitting: Cardiology

## 2020-07-28 NOTE — Telephone Encounter (Signed)
Patient was wondering about missing his remote transmission in Oct. Advised patient transmission was received. Nothing new at this time.

## 2020-07-28 NOTE — Telephone Encounter (Signed)
Patient returning call in regards to missed transmission on 07/12/20.

## 2020-08-04 ENCOUNTER — Encounter (HOSPITAL_COMMUNITY): Payer: Self-pay | Admitting: Emergency Medicine

## 2020-08-04 ENCOUNTER — Other Ambulatory Visit: Payer: Self-pay | Admitting: Orthopaedic Surgery

## 2020-08-04 ENCOUNTER — Other Ambulatory Visit: Payer: Self-pay

## 2020-08-04 ENCOUNTER — Emergency Department (HOSPITAL_COMMUNITY)
Admission: EM | Admit: 2020-08-04 | Discharge: 2020-08-04 | Disposition: A | Discharge status: Home / Self Care | Payer: Medicare (Managed Care) | Attending: Emergency Medicine | Admitting: Emergency Medicine

## 2020-08-04 DIAGNOSIS — R29810 Facial weakness: Secondary | ICD-10-CM | POA: Insufficient documentation

## 2020-08-04 DIAGNOSIS — J45901 Unspecified asthma with (acute) exacerbation: Secondary | ICD-10-CM | POA: Diagnosis not present

## 2020-08-04 DIAGNOSIS — I5042 Chronic combined systolic (congestive) and diastolic (congestive) heart failure: Secondary | ICD-10-CM | POA: Diagnosis not present

## 2020-08-04 DIAGNOSIS — N183 Chronic kidney disease, stage 3 unspecified: Secondary | ICD-10-CM | POA: Diagnosis not present

## 2020-08-04 DIAGNOSIS — I2510 Atherosclerotic heart disease of native coronary artery without angina pectoris: Secondary | ICD-10-CM | POA: Insufficient documentation

## 2020-08-04 DIAGNOSIS — Y9389 Activity, other specified: Secondary | ICD-10-CM | POA: Insufficient documentation

## 2020-08-04 DIAGNOSIS — Y92410 Unspecified street and highway as the place of occurrence of the external cause: Secondary | ICD-10-CM | POA: Insufficient documentation

## 2020-08-04 DIAGNOSIS — S22080D Wedge compression fracture of T11-T12 vertebra, subsequent encounter for fracture with routine healing: Secondary | ICD-10-CM | POA: Diagnosis not present

## 2020-08-04 DIAGNOSIS — E119 Type 2 diabetes mellitus without complications: Secondary | ICD-10-CM | POA: Insufficient documentation

## 2020-08-04 DIAGNOSIS — S24109A Unspecified injury at unspecified level of thoracic spinal cord, initial encounter: Secondary | ICD-10-CM | POA: Diagnosis present

## 2020-08-04 DIAGNOSIS — I130 Hypertensive heart and chronic kidney disease with heart failure and stage 1 through stage 4 chronic kidney disease, or unspecified chronic kidney disease: Secondary | ICD-10-CM | POA: Diagnosis not present

## 2020-08-04 DIAGNOSIS — Z7901 Long term (current) use of anticoagulants: Secondary | ICD-10-CM

## 2020-08-04 DIAGNOSIS — Z79899 Other long term (current) drug therapy: Secondary | ICD-10-CM | POA: Insufficient documentation

## 2020-08-04 DIAGNOSIS — M21372 Foot drop, left foot: Secondary | ICD-10-CM

## 2020-08-04 MED ORDER — ONDANSETRON 4 MG PO TBDP
8.0000 mg | ORAL_TABLET | Freq: Once | ORAL | Status: AC
  Administered 2020-08-04: 8 mg via ORAL
  Filled 2020-08-04: qty 2

## 2020-08-04 MED ORDER — HYDROMORPHONE HCL 1 MG/ML IJ SOLN
2.0000 mg | Freq: Once | INTRAMUSCULAR | Status: AC
  Administered 2020-08-04: 2 mg via INTRAMUSCULAR
  Filled 2020-08-04: qty 2

## 2020-08-04 MED ORDER — OXYCODONE HCL 5 MG PO TABS
5.0000 mg | ORAL_TABLET | ORAL | 0 refills | Status: DC | PRN
Start: 2020-08-04 — End: 2020-09-02
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
## 2020-08-04 NOTE — Discharge Instructions (Addendum)
Follow up with either the neurosurgeon or the orthopedic surgeon.

## 2020-08-05 ENCOUNTER — Inpatient Hospital Stay: Admission: RE | Admit: 2020-08-05 | Payer: Medicare (Managed Care) | Source: Ambulatory Visit

## 2020-08-05 ENCOUNTER — Ambulatory Visit: Payer: Self-pay

## 2020-08-05 ENCOUNTER — Other Ambulatory Visit: Payer: Self-pay

## 2020-08-05 ENCOUNTER — Ambulatory Visit (INDEPENDENT_AMBULATORY_CARE_PROVIDER_SITE_OTHER): Payer: Medicare (Managed Care) | Admitting: Surgery

## 2020-08-05 ENCOUNTER — Other Ambulatory Visit: Payer: Self-pay | Admitting: Orthopaedic Surgery

## 2020-08-05 DIAGNOSIS — S22080A Wedge compression fracture of T11-T12 vertebra, initial encounter for closed fracture: Secondary | ICD-10-CM

## 2020-08-05 DIAGNOSIS — M21372 Foot drop, left foot: Secondary | ICD-10-CM | POA: Diagnosis not present

## 2020-08-05 DIAGNOSIS — M545 Low back pain, unspecified: Secondary | ICD-10-CM | POA: Diagnosis not present

## 2020-08-05 DIAGNOSIS — M5416 Radiculopathy, lumbar region: Secondary | ICD-10-CM

## 2020-08-06 ENCOUNTER — Other Ambulatory Visit: Payer: Self-pay | Admitting: Orthopaedic Surgery

## 2020-08-06 NOTE — Telephone Encounter (Signed)
Is this something you would agree to?

## 2020-08-06 NOTE — Telephone Encounter (Signed)
Patient's daughter Sheran Lawless called requesting an electrical wheelchair for patient. Patient's daughter states her dad is not strong enough to push himself around in a standard wheelchair. Please call [place] at [phone].
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
## 2020-08-06 NOTE — Telephone Encounter (Signed)
They can help their dad with a regular wheelchair for now.  He needs his CT scan of his lumbar spine and he must keep follow-up appointment with Dr. [name] neurosurgeon next Tuesday.  Dean [name] is working on scan currently.  There was some issue yesterday and he was not able to have stat study that I ordered.  Hopefully he can get this done later today or this weekend.  Patient has not seen Dr. [name] since his discharge from the hospital early October.  Dr. [name] can discuss with him treatment options along with any other devices that are needed.  Also please advise patient that if his symptoms worsen in any way before visit with Dr. [name] next week he needs to go back to the emergency room for evaluation.  Thank you.

## 2020-08-09 ENCOUNTER — Telehealth: Payer: Self-pay

## 2020-08-09 ENCOUNTER — Telehealth: Payer: Self-pay | Admitting: Orthopaedic Surgery

## 2020-08-09 NOTE — Telephone Encounter (Signed)
Patient called he lost rx for wheelchair, he needs rx for wheelchair re faxed to [facility] call [phone]

## 2020-08-09 NOTE — Telephone Encounter (Signed)
Pt called stating he went for an MRI but they told him they were still waiting on our office to tell them what the MRI is for; so the pt would like to have that fixed and he would also like a CB to discuss his overall health status. Pt also wanted to clarify wether he would be following up with Dr.Yates again after the MRI or did Dr.Yates want him to go to [facility]?  [phone]

## 2020-08-09 NOTE — Telephone Encounter (Signed)
Holding for [employer]. I sent note to Dr Lorin Mercy as well asking about rx for wheelchair.

## 2020-08-09 NOTE — Telephone Encounter (Signed)
Ok for this? 

## 2020-08-09 NOTE — Telephone Encounter (Signed)
Ok

## 2020-08-10 ENCOUNTER — Telehealth: Payer: Self-pay | Admitting: Orthopaedic Surgery

## 2020-08-10 NOTE — Telephone Encounter (Signed)
I wrote new rx for this but was not sure where it needed to be sent? I tried calling patient to verify. No asnwer.

## 2020-08-10 NOTE — Telephone Encounter (Signed)
Duplicate message in chart. I called patient and spoke with he and his daughter.

## 2020-08-10 NOTE — Telephone Encounter (Signed)
08/05/20 ov note (Face to Face encounter) to support need of Wheelchair, faxed to [facility]

## 2020-08-10 NOTE — Telephone Encounter (Signed)
Duplicate message in chart. I called pt

## 2020-08-10 NOTE — Telephone Encounter (Signed)
I called and spoke with patient and his daughter. He will keep appt with Dr. [name] today. I also explained that CT Lumbar is still in review with his insurance and this is why it has not been scheduled. Lastly, the DME carrier found the rx for a wheelchair that was written and we do not need to fax anything new. They will call if they have any additional problems or questions.

## 2020-08-12 ENCOUNTER — Inpatient Hospital Stay (HOSPITAL_COMMUNITY): Payer: Medicare (Managed Care) | Admitting: Certified Registered"

## 2020-08-12 ENCOUNTER — Encounter (HOSPITAL_COMMUNITY): Payer: Self-pay | Admitting: Internal Medicine

## 2020-08-12 ENCOUNTER — Inpatient Hospital Stay (HOSPITAL_COMMUNITY)
Admission: EM | Admit: 2020-08-12 | Discharge: 2020-09-02 | DRG: 029 | Disposition: A | Discharge status: Skilled Nursing Facility | Payer: Medicare (Managed Care) | Attending: Internal Medicine | Admitting: Internal Medicine

## 2020-08-12 ENCOUNTER — Inpatient Hospital Stay (HOSPITAL_COMMUNITY): Payer: Medicare (Managed Care)

## 2020-08-12 ENCOUNTER — Encounter (HOSPITAL_COMMUNITY)
Admission: EM | Disposition: A | Discharge status: Skilled Nursing Facility | Payer: Self-pay | Source: Home / Self Care | Attending: Internal Medicine

## 2020-08-12 ENCOUNTER — Other Ambulatory Visit: Payer: Self-pay

## 2020-08-12 DIAGNOSIS — T380X5A Adverse effect of glucocorticoids and synthetic analogues, initial encounter: Secondary | ICD-10-CM | POA: Diagnosis present

## 2020-08-12 DIAGNOSIS — E7800 Pure hypercholesterolemia, unspecified: Secondary | ICD-10-CM | POA: Diagnosis present

## 2020-08-12 DIAGNOSIS — Z91041 Radiographic dye allergy status: Secondary | ICD-10-CM

## 2020-08-12 DIAGNOSIS — S24154S Other incomplete lesion at T11-T12 level of thoracic spinal cord, sequela: Secondary | ICD-10-CM | POA: Diagnosis not present

## 2020-08-12 DIAGNOSIS — S064X0D Epidural hemorrhage without loss of consciousness, subsequent encounter: Secondary | ICD-10-CM | POA: Diagnosis not present

## 2020-08-12 DIAGNOSIS — I2510 Atherosclerotic heart disease of native coronary artery without angina pectoris: Secondary | ICD-10-CM | POA: Diagnosis present

## 2020-08-12 DIAGNOSIS — N1832 Chronic kidney disease, stage 3b: Secondary | ICD-10-CM | POA: Diagnosis present

## 2020-08-12 DIAGNOSIS — E875 Hyperkalemia: Secondary | ICD-10-CM | POA: Diagnosis present

## 2020-08-12 DIAGNOSIS — X58XXXA Exposure to other specified factors, initial encounter: Secondary | ICD-10-CM | POA: Diagnosis present

## 2020-08-12 DIAGNOSIS — Z79899 Other long term (current) drug therapy: Secondary | ICD-10-CM

## 2020-08-12 DIAGNOSIS — S064XAS Epidural hemorrhage with loss of consciousness status unknown, sequela: Secondary | ICD-10-CM

## 2020-08-12 DIAGNOSIS — E6609 Other obesity due to excess calories: Secondary | ICD-10-CM | POA: Diagnosis present

## 2020-08-12 DIAGNOSIS — Z6835 Body mass index (BMI) 35.0-35.9, adult: Secondary | ICD-10-CM

## 2020-08-12 DIAGNOSIS — Z888 Allergy status to other drugs, medicaments and biological substances status: Secondary | ICD-10-CM

## 2020-08-12 DIAGNOSIS — E871 Hypo-osmolality and hyponatremia: Secondary | ICD-10-CM | POA: Diagnosis not present

## 2020-08-12 DIAGNOSIS — N179 Acute kidney failure, unspecified: Secondary | ICD-10-CM | POA: Diagnosis present

## 2020-08-12 DIAGNOSIS — E1122 Type 2 diabetes mellitus with diabetic chronic kidney disease: Secondary | ICD-10-CM | POA: Diagnosis present

## 2020-08-12 DIAGNOSIS — M5441 Lumbago with sciatica, right side: Secondary | ICD-10-CM | POA: Diagnosis present

## 2020-08-12 DIAGNOSIS — E119 Type 2 diabetes mellitus without complications: Secondary | ICD-10-CM

## 2020-08-12 DIAGNOSIS — G4733 Obstructive sleep apnea (adult) (pediatric): Secondary | ICD-10-CM | POA: Diagnosis present

## 2020-08-12 DIAGNOSIS — Z20822 Contact with and (suspected) exposure to covid-19: Secondary | ICD-10-CM | POA: Diagnosis present

## 2020-08-12 DIAGNOSIS — E1165 Type 2 diabetes mellitus with hyperglycemia: Secondary | ICD-10-CM | POA: Diagnosis present

## 2020-08-12 DIAGNOSIS — I10 Essential (primary) hypertension: Secondary | ICD-10-CM | POA: Diagnosis present

## 2020-08-12 DIAGNOSIS — M21372 Foot drop, left foot: Secondary | ICD-10-CM | POA: Diagnosis present

## 2020-08-12 DIAGNOSIS — R29898 Other symptoms and signs involving the musculoskeletal system: Secondary | ICD-10-CM

## 2020-08-12 DIAGNOSIS — E785 Hyperlipidemia, unspecified: Secondary | ICD-10-CM | POA: Diagnosis present

## 2020-08-12 DIAGNOSIS — I447 Left bundle-branch block, unspecified: Secondary | ICD-10-CM | POA: Diagnosis present

## 2020-08-12 DIAGNOSIS — Z8249 Family history of ischemic heart disease and other diseases of the circulatory system: Secondary | ICD-10-CM

## 2020-08-12 DIAGNOSIS — G9529 Other cord compression: Secondary | ICD-10-CM | POA: Diagnosis present

## 2020-08-12 DIAGNOSIS — M5442 Lumbago with sciatica, left side: Secondary | ICD-10-CM | POA: Diagnosis present

## 2020-08-12 DIAGNOSIS — Z8674 Personal history of sudden cardiac arrest: Secondary | ICD-10-CM | POA: Diagnosis not present

## 2020-08-12 DIAGNOSIS — Z6837 Body mass index (BMI) 37.0-37.9, adult: Secondary | ICD-10-CM

## 2020-08-12 DIAGNOSIS — S064X9D Epidural hemorrhage with loss of consciousness of unspecified duration, subsequent encounter: Secondary | ICD-10-CM

## 2020-08-12 DIAGNOSIS — J45909 Unspecified asthma, uncomplicated: Secondary | ICD-10-CM | POA: Diagnosis present

## 2020-08-12 DIAGNOSIS — I482 Chronic atrial fibrillation, unspecified: Secondary | ICD-10-CM | POA: Diagnosis present

## 2020-08-12 DIAGNOSIS — N183 Chronic kidney disease, stage 3 unspecified: Secondary | ICD-10-CM | POA: Diagnosis present

## 2020-08-12 DIAGNOSIS — I255 Ischemic cardiomyopathy: Secondary | ICD-10-CM | POA: Diagnosis present

## 2020-08-12 DIAGNOSIS — Z7901 Long term (current) use of anticoagulants: Secondary | ICD-10-CM

## 2020-08-12 DIAGNOSIS — E669 Obesity, unspecified: Secondary | ICD-10-CM | POA: Diagnosis present

## 2020-08-12 DIAGNOSIS — I129 Hypertensive chronic kidney disease with stage 1 through stage 4 chronic kidney disease, or unspecified chronic kidney disease: Secondary | ICD-10-CM

## 2020-08-12 DIAGNOSIS — S064X9S Epidural hemorrhage with loss of consciousness of unspecified duration, sequela: Secondary | ICD-10-CM | POA: Diagnosis not present

## 2020-08-12 DIAGNOSIS — Z809 Family history of malignant neoplasm, unspecified: Secondary | ICD-10-CM

## 2020-08-12 DIAGNOSIS — D62 Acute posthemorrhagic anemia: Secondary | ICD-10-CM | POA: Diagnosis not present

## 2020-08-12 DIAGNOSIS — S064X9A Epidural hemorrhage with loss of consciousness of unspecified duration, initial encounter: Secondary | ICD-10-CM | POA: Diagnosis not present

## 2020-08-12 DIAGNOSIS — Z95810 Presence of automatic (implantable) cardiac defibrillator: Secondary | ICD-10-CM | POA: Diagnosis not present

## 2020-08-12 DIAGNOSIS — I130 Hypertensive heart and chronic kidney disease with heart failure and stage 1 through stage 4 chronic kidney disease, or unspecified chronic kidney disease: Secondary | ICD-10-CM | POA: Diagnosis present

## 2020-08-12 DIAGNOSIS — I4891 Unspecified atrial fibrillation: Secondary | ICD-10-CM

## 2020-08-12 DIAGNOSIS — S22089S Unspecified fracture of T11-T12 vertebra, sequela: Secondary | ICD-10-CM | POA: Diagnosis not present

## 2020-08-12 DIAGNOSIS — D72829 Elevated white blood cell count, unspecified: Secondary | ICD-10-CM | POA: Diagnosis present

## 2020-08-12 DIAGNOSIS — I509 Heart failure, unspecified: Secondary | ICD-10-CM

## 2020-08-12 DIAGNOSIS — E1169 Type 2 diabetes mellitus with other specified complication: Secondary | ICD-10-CM | POA: Diagnosis present

## 2020-08-12 DIAGNOSIS — Z419 Encounter for procedure for purposes other than remedying health state, unspecified: Secondary | ICD-10-CM

## 2020-08-12 DIAGNOSIS — I5042 Chronic combined systolic (congestive) and diastolic (congestive) heart failure: Secondary | ICD-10-CM | POA: Diagnosis present

## 2020-08-12 HISTORY — PX: LUMBAR LAMINECTOMY/DECOMPRESSION MICRODISCECTOMY: SHX5026

## 2020-08-12 LAB — POCT I-STAT 7, (LYTES, BLD GAS, ICA,H+H)
Acid-base deficit: 7 mmol/L — ABNORMAL HIGH (ref 0.0–2.0)
Bicarbonate: 20.2 mmol/L (ref 20.0–28.0)
Calcium, Ion: 1.31 mmol/L (ref 1.15–1.40)
HCT: 25 % — ABNORMAL LOW (ref 39.0–52.0)
Hemoglobin: 8.5 g/dL — ABNORMAL LOW (ref 13.0–17.0)
O2 Saturation: 100 %
Potassium: 5.2 mmol/L — ABNORMAL HIGH (ref 3.5–5.1)
Sodium: 132 mmol/L — ABNORMAL LOW (ref 135–145)
TCO2: 22 mmol/L (ref 22–32)
pCO2 arterial: 47.5 mmHg (ref 32.0–48.0)
pH, Arterial: 7.236 — ABNORMAL LOW (ref 7.350–7.450)
pO2, Arterial: 291 mmHg — ABNORMAL HIGH (ref 83.0–108.0)

## 2020-08-12 LAB — PREPARE CRYOPRECIPITATE
Unit division: 0
Unit division: 0

## 2020-08-12 LAB — PROTIME-INR
INR: 1.5 — ABNORMAL HIGH (ref 0.8–1.2)
Prothrombin Time: 17.9 seconds — ABNORMAL HIGH (ref 11.4–15.2)

## 2020-08-12 LAB — ABO/RH: ABO/RH(D): O POS

## 2020-08-12 LAB — CBC WITH DIFFERENTIAL/PLATELET
Abs Immature Granulocytes: 0.12 10*3/uL — ABNORMAL HIGH (ref 0.00–0.07)
Basophils Absolute: 0.1 10*3/uL (ref 0.0–0.1)
Basophils Relative: 1 %
Eosinophils Absolute: 0.6 10*3/uL — ABNORMAL HIGH (ref 0.0–0.5)
Eosinophils Relative: 4 %
HCT: 32.7 % — ABNORMAL LOW (ref 39.0–52.0)
Hemoglobin: 10.4 g/dL — ABNORMAL LOW (ref 13.0–17.0)
Immature Granulocytes: 1 %
Lymphocytes Relative: 15 %
Lymphs Abs: 1.9 10*3/uL (ref 0.7–4.0)
MCH: 29.1 pg (ref 26.0–34.0)
MCHC: 31.8 g/dL (ref 30.0–36.0)
MCV: 91.6 fL (ref 80.0–100.0)
Monocytes Absolute: 0.9 10*3/uL (ref 0.1–1.0)
Monocytes Relative: 7 %
Neutro Abs: 9.6 10*3/uL — ABNORMAL HIGH (ref 1.7–7.7)
Neutrophils Relative %: 72 %
Platelets: 195 10*3/uL (ref 150–400)
RBC: 3.57 MIL/uL — ABNORMAL LOW (ref 4.22–5.81)
RDW: 13.8 % (ref 11.5–15.5)
WBC: 13.2 10*3/uL — ABNORMAL HIGH (ref 4.0–10.5)
nRBC: 0 % (ref 0.0–0.2)

## 2020-08-12 LAB — POCT I-STAT, CHEM 8
BUN: >130 mg/dL — ABNORMAL HIGH (ref 8–23)
Calcium, Ion: 1.24 mmol/L (ref 1.15–1.40)
Chloride: 99 mmol/L (ref 98–111)
Creatinine, Ser: 6.9 mg/dL — ABNORMAL HIGH (ref 0.61–1.24)
Glucose, Bld: 144 mg/dL — ABNORMAL HIGH (ref 70–99)
HCT: 35 % — ABNORMAL LOW (ref 39.0–52.0)
Hemoglobin: 11.9 g/dL — ABNORMAL LOW (ref 13.0–17.0)
Potassium: 5.7 mmol/L — ABNORMAL HIGH (ref 3.5–5.1)
Sodium: 130 mmol/L — ABNORMAL LOW (ref 135–145)
TCO2: 19 mmol/L — ABNORMAL LOW (ref 22–32)

## 2020-08-12 LAB — BASIC METABOLIC PANEL
Anion gap: 16 — ABNORMAL HIGH (ref 5–15)
Anion gap: 16 — ABNORMAL HIGH (ref 5–15)
BUN: 155 mg/dL — ABNORMAL HIGH (ref 8–23)
BUN: 155 mg/dL — ABNORMAL HIGH (ref 8–23)
CO2: 17 mmol/L — ABNORMAL LOW (ref 22–32)
CO2: 17 mmol/L — ABNORMAL LOW (ref 22–32)
Calcium: 10.9 mg/dL — ABNORMAL HIGH (ref 8.9–10.3)
Calcium: 10.3 mg/dL (ref 8.9–10.3)
Chloride: 98 mmol/L (ref 98–111)
Chloride: 98 mmol/L (ref 98–111)
Creatinine, Ser: 6.51 mg/dL — ABNORMAL HIGH (ref 0.61–1.24)
Creatinine, Ser: 6.74 mg/dL — ABNORMAL HIGH (ref 0.61–1.24)
GFR, Estimated: 9 mL/min — ABNORMAL LOW (ref >60)
GFR, Estimated: 8 mL/min — ABNORMAL LOW (ref >60)
Glucose, Bld: 104 mg/dL — ABNORMAL HIGH (ref 70–99)
Glucose, Bld: 98 mg/dL (ref 70–99)
Potassium: 6.9 mmol/L (ref 3.5–5.1)
Potassium: 5.8 mmol/L — ABNORMAL HIGH (ref 3.5–5.1)
Sodium: 131 mmol/L — ABNORMAL LOW (ref 135–145)
Sodium: 131 mmol/L — ABNORMAL LOW (ref 135–145)

## 2020-08-12 LAB — BPAM CRYOPRECIPITATE
Blood Product Expiration Date: 202111182115
Blood Product Expiration Date: 202111182115
ISSUE DATE / TIME: 202111181538
ISSUE DATE / TIME: 202111181538
Unit Type and Rh: 5100
Unit Type and Rh: 5100

## 2020-08-12 LAB — RESPIRATORY PANEL BY RT PCR (FLU A&B, COVID)
Influenza A by PCR: NEGATIVE
Influenza B by PCR: NEGATIVE
SARS Coronavirus 2 by RT PCR: NEGATIVE

## 2020-08-12 LAB — CBG MONITORING, ED
Glucose-Capillary: 138 mg/dL — ABNORMAL HIGH (ref 70–99)
Glucose-Capillary: 123 mg/dL — ABNORMAL HIGH (ref 70–99)

## 2020-08-12 LAB — TYPE AND SCREEN
ABO/RH(D): O POS
Antibody Screen: NEGATIVE

## 2020-08-12 LAB — APTT: aPTT: 37 seconds — ABNORMAL HIGH (ref 24–36)

## 2020-08-12 LAB — HEPARIN LEVEL (UNFRACTIONATED): Heparin Unfractionated: >2.2 IU/mL — ABNORMAL HIGH (ref 0.30–0.70)

## 2020-08-12 SURGERY — LUMBAR LAMINECTOMY/DECOMPRESSION MICRODISCECTOMY 1 LEVEL
Anesthesia: General | Site: Spine Thoracic

## 2020-08-12 MED ORDER — ALLOPURINOL 300 MG PO TABS
300.0000 mg | ORAL_TABLET | Freq: Every day | ORAL | Status: DC
  Administered 2020-08-12 – 2020-08-13 (×2): 300 mg via ORAL
  Filled 2020-08-12 (×2): qty 1

## 2020-08-12 MED ORDER — HYDRALAZINE HCL 20 MG/ML IJ SOLN: 5.0000 mg | INTRAMUSCULAR | Status: DC | PRN

## 2020-08-12 MED ORDER — SODIUM CHLORIDE 0.9% FLUSH
3.0000 mL | Freq: Two times a day (BID) | INTRAVENOUS | Status: DC
  Administered 2020-08-12 – 2020-08-28 (×22): 3 mL via INTRAVENOUS

## 2020-08-12 MED ORDER — SUGAMMADEX SODIUM 200 MG/2ML IV SOLN
INTRAVENOUS | Status: DC | PRN
  Administered 2020-08-12: 200 mg via INTRAVENOUS

## 2020-08-12 MED ORDER — ONDANSETRON HCL 4 MG/2ML IJ SOLN: 4.0000 mg | Freq: Once | INTRAMUSCULAR | Status: DC | PRN

## 2020-08-12 MED ORDER — ONDANSETRON HCL 4 MG/2ML IJ SOLN
4.0000 mg | Freq: Once | INTRAMUSCULAR | Status: AC
  Administered 2020-08-12: 4 mg via INTRAVENOUS
  Filled 2020-08-12: qty 2

## 2020-08-12 MED ORDER — DEXAMETHASONE 4 MG PO TABS
4.0000 mg | ORAL_TABLET | Freq: Four times a day (QID) | ORAL | Status: DC
  Administered 2020-08-13 – 2020-08-19 (×15): 4 mg via ORAL
  Filled 2020-08-12 (×28): qty 1

## 2020-08-12 MED ORDER — ACETAMINOPHEN 325 MG PO TABS: 650.0000 mg | ORAL_TABLET | Freq: Four times a day (QID) | ORAL | Status: DC | PRN

## 2020-08-12 MED ORDER — ALBUMIN HUMAN 5 % IV SOLN: INTRAVENOUS | Status: DC | PRN

## 2020-08-12 MED ORDER — CALCIUM GLUCONATE-NACL 1-0.675 GM/50ML-% IV SOLN
1.0000 g | Freq: Once | INTRAVENOUS | Status: AC
  Administered 2020-08-12: 1000 mg via INTRAVENOUS
  Filled 2020-08-12: qty 50

## 2020-08-12 MED ORDER — THROMBIN 5000 UNITS EX SOLR
CUTANEOUS | Status: AC
  Filled 2020-08-12: qty 5000

## 2020-08-12 MED ORDER — LIDOCAINE 2% (20 MG/ML) 5 ML SYRINGE
INTRAMUSCULAR | Status: DC | PRN
  Administered 2020-08-12: 60 mg via INTRAVENOUS

## 2020-08-12 MED ORDER — ENSURE ENLIVE PO LIQD
237.0000 mL | Freq: Two times a day (BID) | ORAL | Status: DC
  Administered 2020-08-13 – 2020-09-02 (×28): 237 mL via ORAL

## 2020-08-12 MED ORDER — DEXAMETHASONE SODIUM PHOSPHATE 10 MG/ML IJ SOLN
INTRAMUSCULAR | Status: AC
  Filled 2020-08-12: qty 1

## 2020-08-12 MED ORDER — SODIUM CHLORIDE 0.9 % IV BOLUS
500.0000 mL | Freq: Once | INTRAVENOUS | Status: AC
  Administered 2020-08-12: 500 mL via INTRAVENOUS

## 2020-08-12 MED ORDER — DEXAMETHASONE SODIUM PHOSPHATE 10 MG/ML IJ SOLN
4.0000 mg | Freq: Four times a day (QID) | INTRAMUSCULAR | Status: DC
  Administered 2020-08-12 – 2020-08-18 (×11): 4 mg via INTRAVENOUS
  Filled 2020-08-12 (×14): qty 1

## 2020-08-12 MED ORDER — THROMBIN 5000 UNITS EX SOLR: OROMUCOSAL | Status: DC | PRN

## 2020-08-12 MED ORDER — HYDROMORPHONE HCL 1 MG/ML IJ SOLN
1.0000 mg | Freq: Once | INTRAMUSCULAR | Status: AC
  Administered 2020-08-12: 1 mg via INTRAVENOUS
  Filled 2020-08-12: qty 1

## 2020-08-12 MED ORDER — POLYETHYLENE GLYCOL 3350 17 G PO PACK: 17.0000 g | PACK | Freq: Every day | ORAL | Status: DC | PRN

## 2020-08-12 MED ORDER — FENTANYL CITRATE (PF) 250 MCG/5ML IJ SOLN
INTRAMUSCULAR | Status: AC
  Filled 2020-08-12: qty 5

## 2020-08-12 MED ORDER — CHLORHEXIDINE GLUCONATE CLOTH 2 % EX PADS
6.0000 | MEDICATED_PAD | Freq: Every day | CUTANEOUS | Status: DC
  Administered 2020-08-13 – 2020-08-20 (×6): 6 via TOPICAL

## 2020-08-12 MED ORDER — BUPIVACAINE HCL (PF) 0.25 % IJ SOLN
INTRAMUSCULAR | Status: AC
  Filled 2020-08-12: qty 30

## 2020-08-12 MED ORDER — PROPOFOL 10 MG/ML IV BOLUS
INTRAVENOUS | Status: DC | PRN
  Administered 2020-08-12: 110 mg via INTRAVENOUS

## 2020-08-12 MED ORDER — AMIODARONE HCL 100 MG PO TABS
100.0000 mg | ORAL_TABLET | Freq: Every day | ORAL | Status: DC
  Administered 2020-08-12 – 2020-09-02 (×22): 100 mg via ORAL
  Filled 2020-08-12 (×22): qty 1

## 2020-08-12 MED ORDER — HYDROCODONE-ACETAMINOPHEN 5-325 MG PO TABS: 1.0000 | ORAL_TABLET | ORAL | Status: DC | PRN

## 2020-08-12 MED ORDER — ALBUTEROL SULFATE (2.5 MG/3ML) 0.083% IN NEBU
10.0000 mg | INHALATION_SOLUTION | Freq: Once | RESPIRATORY_TRACT | Status: AC
  Administered 2020-08-12: 10 mg via RESPIRATORY_TRACT
  Filled 2020-08-12: qty 12

## 2020-08-12 MED ORDER — CEFAZOLIN SODIUM-DEXTROSE 2-3 GM-%(50ML) IV SOLR
INTRAVENOUS | Status: DC | PRN
  Administered 2020-08-12: 2 g via INTRAVENOUS

## 2020-08-12 MED ORDER — ONDANSETRON HCL 4 MG/2ML IJ SOLN
INTRAMUSCULAR | Status: DC | PRN
  Administered 2020-08-12: 4 mg via INTRAVENOUS

## 2020-08-12 MED ORDER — DEXTROSE 50 % IV SOLN
1.0000 | Freq: Once | INTRAVENOUS | Status: AC
  Administered 2020-08-12: 50 mL via INTRAVENOUS
  Filled 2020-08-12: qty 50

## 2020-08-12 MED ORDER — ADULT MULTIVITAMIN W/MINERALS CH
1.0000 | ORAL_TABLET | Freq: Every day | ORAL | Status: DC
  Administered 2020-08-13 – 2020-09-02 (×21): 1 via ORAL
  Filled 2020-08-12 (×22): qty 1

## 2020-08-12 MED ORDER — THROMBIN 20000 UNITS EX SOLR
CUTANEOUS | Status: AC
  Filled 2020-08-12: qty 20000

## 2020-08-12 MED ORDER — ACETAMINOPHEN 325 MG PO TABS
650.0000 mg | ORAL_TABLET | ORAL | Status: DC | PRN
  Administered 2020-08-22 – 2020-08-31 (×5): 650 mg via ORAL
  Filled 2020-08-12 (×6): qty 2

## 2020-08-12 MED ORDER — ACETAMINOPHEN 650 MG RE SUPP: 650.0000 mg | RECTAL | Status: DC | PRN

## 2020-08-12 MED ORDER — FENTANYL CITRATE (PF) 100 MCG/2ML IJ SOLN: 25.0000 ug | INTRAMUSCULAR | Status: DC | PRN

## 2020-08-12 MED ORDER — ONDANSETRON HCL 4 MG/2ML IJ SOLN: 4.0000 mg | Freq: Four times a day (QID) | INTRAMUSCULAR | Status: DC | PRN

## 2020-08-12 MED ORDER — 0.9 % SODIUM CHLORIDE (POUR BTL) OPTIME
TOPICAL | Status: DC | PRN
  Administered 2020-08-12: 1000 mL

## 2020-08-12 MED ORDER — INSULIN ASPART 100 UNIT/ML ~~LOC~~ SOLN
SUBCUTANEOUS | Status: AC
  Filled 2020-08-12: qty 1

## 2020-08-12 MED ORDER — ALBUMIN HUMAN 5 % IV SOLN
INTRAVENOUS | Status: AC
  Filled 2020-08-12: qty 250

## 2020-08-12 MED ORDER — CEFAZOLIN SODIUM-DEXTROSE 1-4 GM/50ML-% IV SOLN
1.0000 g | Freq: Two times a day (BID) | INTRAVENOUS | Status: AC
  Administered 2020-08-13 (×2): 1 g via INTRAVENOUS
  Filled 2020-08-12 (×3): qty 50

## 2020-08-12 MED ORDER — FENTANYL CITRATE (PF) 250 MCG/5ML IJ SOLN
INTRAMUSCULAR | Status: DC | PRN
  Administered 2020-08-12: 100 ug via INTRAVENOUS
  Administered 2020-08-12: 50 ug via INTRAVENOUS

## 2020-08-12 MED ORDER — PANTOPRAZOLE SODIUM 40 MG PO TBEC
40.0000 mg | DELAYED_RELEASE_TABLET | Freq: Every day | ORAL | Status: DC
  Administered 2020-08-12 – 2020-09-02 (×22): 40 mg via ORAL
  Filled 2020-08-12 (×22): qty 1

## 2020-08-12 MED ORDER — DEXTROSE 50 % IV SOLN
INTRAVENOUS | Status: DC | PRN
  Administered 2020-08-12: 1 via INTRAVENOUS

## 2020-08-12 MED ORDER — ROCURONIUM BROMIDE 10 MG/ML (PF) SYRINGE
PREFILLED_SYRINGE | INTRAVENOUS | Status: DC | PRN
  Administered 2020-08-12: 100 mg via INTRAVENOUS

## 2020-08-12 MED ORDER — MORPHINE SULFATE (PF) 2 MG/ML IV SOLN: 2.0000 mg | INTRAVENOUS | Status: DC | PRN

## 2020-08-12 MED ORDER — SODIUM CHLORIDE 0.9 % IV SOLN: INTRAVENOUS | Status: DC

## 2020-08-12 MED ORDER — DEXAMETHASONE SODIUM PHOSPHATE 10 MG/ML IJ SOLN
INTRAMUSCULAR | Status: DC | PRN
  Administered 2020-08-12: 10 mg via INTRAVENOUS

## 2020-08-12 MED ORDER — HEPARIN SODIUM (PORCINE) 5000 UNIT/ML IJ SOLN: 5000.0000 [IU] | Freq: Three times a day (TID) | INTRAMUSCULAR | Status: DC

## 2020-08-12 MED ORDER — OXYCODONE HCL 5 MG PO TABS: 5.0000 mg | ORAL_TABLET | Freq: Once | ORAL | Status: DC | PRN

## 2020-08-12 MED ORDER — HYDROCODONE-ACETAMINOPHEN 10-325 MG PO TABS: 2.0000 | ORAL_TABLET | ORAL | Status: DC | PRN

## 2020-08-12 MED ORDER — PROTHROMBIN COMPLEX CONC HUMAN 500 UNITS IV KIT
4805.0000 [IU] | PACK | Status: AC
  Administered 2020-08-12: 4805 [IU] via INTRAVENOUS
  Filled 2020-08-12: qty 500

## 2020-08-12 MED ORDER — PHENYLEPHRINE HCL-NACL 10-0.9 MG/250ML-% IV SOLN
INTRAVENOUS | Status: DC | PRN
  Administered 2020-08-12: 50 ug/min via INTRAVENOUS

## 2020-08-12 MED ORDER — BISACODYL 5 MG PO TBEC: 5.0000 mg | DELAYED_RELEASE_TABLET | Freq: Every day | ORAL | Status: DC | PRN

## 2020-08-12 MED ORDER — ONDANSETRON HCL 4 MG PO TABS: 4.0000 mg | ORAL_TABLET | Freq: Four times a day (QID) | ORAL | Status: DC | PRN

## 2020-08-12 MED ORDER — OXYCODONE HCL 5 MG PO TABS: 5.0000 mg | ORAL_TABLET | ORAL | Status: DC | PRN

## 2020-08-12 MED ORDER — ACETAMINOPHEN 650 MG RE SUPP: 650.0000 mg | Freq: Four times a day (QID) | RECTAL | Status: DC | PRN

## 2020-08-12 MED ORDER — ONDANSETRON HCL 4 MG/2ML IJ SOLN
INTRAMUSCULAR | Status: AC
  Filled 2020-08-12: qty 2

## 2020-08-12 MED ORDER — MENTHOL 3 MG MT LOZG
1.0000 | LOZENGE | OROMUCOSAL | Status: DC | PRN
  Filled 2020-08-12 (×3): qty 9

## 2020-08-12 MED ORDER — DOCUSATE SODIUM 100 MG PO CAPS
100.0000 mg | ORAL_CAPSULE | Freq: Two times a day (BID) | ORAL | Status: DC
  Administered 2020-08-12 – 2020-09-02 (×33): 100 mg via ORAL
  Filled 2020-08-12 (×37): qty 1

## 2020-08-12 MED ORDER — SODIUM ZIRCONIUM CYCLOSILICATE 10 G PO PACK
10.0000 g | PACK | ORAL | Status: AC
  Administered 2020-08-12: 10 g via ORAL
  Filled 2020-08-12: qty 1

## 2020-08-12 MED ORDER — ATORVASTATIN CALCIUM 40 MG PO TABS
40.0000 mg | ORAL_TABLET | Freq: Every day | ORAL | Status: DC
  Administered 2020-08-12 – 2020-09-02 (×22): 40 mg via ORAL
  Filled 2020-08-12 (×22): qty 1

## 2020-08-12 MED ORDER — INSULIN ASPART 100 UNIT/ML ~~LOC~~ SOLN
SUBCUTANEOUS | Status: DC | PRN
  Administered 2020-08-12: 10 [IU] via SUBCUTANEOUS

## 2020-08-12 MED ORDER — INSULIN ASPART 100 UNIT/ML IV SOLN
5.0000 [IU] | Freq: Once | INTRAVENOUS | Status: AC
  Administered 2020-08-12: 5 [IU] via INTRAVENOUS

## 2020-08-12 MED ORDER — CHARCOAL ACTIVATED PO LIQD: 50.0000 g | ORAL | Status: DC

## 2020-08-12 MED ORDER — MAGNESIUM OXIDE 400 (241.3 MG) MG PO TABS
200.0000 mg | ORAL_TABLET | Freq: Two times a day (BID) | ORAL | Status: DC
  Administered 2020-08-12 – 2020-09-02 (×43): 200 mg via ORAL
  Filled 2020-08-12: qty 0.5
  Filled 2020-08-12: qty 1
  Filled 2020-08-12: qty 0.5
  Filled 2020-08-12 (×12): qty 1
  Filled 2020-08-12: qty 0.5
  Filled 2020-08-12 (×30): qty 1

## 2020-08-12 MED ORDER — PHENOL 1.4 % MT LIQD
1.0000 | OROMUCOSAL | Status: DC | PRN
  Filled 2020-08-12: qty 177

## 2020-08-12 MED ORDER — ALBUTEROL SULFATE (2.5 MG/3ML) 0.083% IN NEBU: 2.5000 mg | INHALATION_SOLUTION | RESPIRATORY_TRACT | Status: DC | PRN

## 2020-08-12 MED ORDER — THROMBIN 20000 UNITS EX SOLR
CUTANEOUS | Status: DC | PRN
  Administered 2020-08-12: 20 mL via TOPICAL

## 2020-08-12 MED ORDER — EPHEDRINE 5 MG/ML INJ
INTRAVENOUS | Status: AC
  Filled 2020-08-12: qty 20

## 2020-08-12 MED ORDER — GADOBUTROL 1 MMOL/ML IV SOLN
9.9000 mL | Freq: Once | INTRAVENOUS | Status: AC | PRN
  Administered 2020-08-12: 9.9 mL via INTRAVENOUS

## 2020-08-12 MED ORDER — BUPIVACAINE HCL (PF) 0.25 % IJ SOLN
INTRAMUSCULAR | Status: DC | PRN
  Administered 2020-08-12: 20 mL

## 2020-08-12 MED ORDER — CYCLOBENZAPRINE HCL 10 MG PO TABS
10.0000 mg | ORAL_TABLET | Freq: Three times a day (TID) | ORAL | Status: DC | PRN
  Administered 2020-08-27 – 2020-08-31 (×3): 10 mg via ORAL
  Filled 2020-08-12 (×3): qty 1

## 2020-08-12 MED ORDER — PROPOFOL 10 MG/ML IV BOLUS
INTRAVENOUS | Status: AC
  Filled 2020-08-12: qty 20

## 2020-08-12 MED ORDER — CEFAZOLIN SODIUM-DEXTROSE 2-4 GM/100ML-% IV SOLN
INTRAVENOUS | Status: AC
  Filled 2020-08-12: qty 100

## 2020-08-12 MED ORDER — ALBUMIN HUMAN 5 % IV SOLN: 12.5000 g | Freq: Once | INTRAVENOUS | Status: DC

## 2020-08-12 MED ORDER — HYDROMORPHONE HCL 1 MG/ML IJ SOLN
1.0000 mg | INTRAMUSCULAR | Status: DC | PRN
  Administered 2020-08-12: 1 mg via INTRAVENOUS
  Filled 2020-08-12: qty 1

## 2020-08-12 MED ORDER — OXYCODONE HCL 5 MG/5ML PO SOLN: 5.0000 mg | Freq: Once | ORAL | Status: DC | PRN

## 2020-08-12 SURGICAL SUPPLY — 54 items
ADH SKN CLS APL DERMABOND .7 (GAUZE/BANDAGES/DRESSINGS) ×1
APL SKNCLS STERI-STRIP NONHPOA (GAUZE/BANDAGES/DRESSINGS) ×1
BAG DECANTER FOR FLEXI CONT (MISCELLANEOUS) ×2 IMPLANT
BAND INSRT 18 STRL LF DISP RB (MISCELLANEOUS)
BAND RUBBER #18 3X1/16 STRL (MISCELLANEOUS) ×2 IMPLANT
BENZOIN TINCTURE PRP APPL 2/3 (GAUZE/BANDAGES/DRESSINGS) ×2 IMPLANT
BLADE CLIPPER SURG (BLADE) ×1 IMPLANT
BUR CUTTER 7.0 ROUND (BURR) ×2 IMPLANT
CANISTER SUCT 3000ML PPV (MISCELLANEOUS) ×2 IMPLANT
CARTRIDGE OIL MAESTRO DRILL (MISCELLANEOUS) ×1 IMPLANT
DECANTER SPIKE VIAL GLASS SM (MISCELLANEOUS) ×2 IMPLANT
DERMABOND ADVANCED (GAUZE/BANDAGES/DRESSINGS) ×1
DERMABOND ADVANCED .7 DNX12 (GAUZE/BANDAGES/DRESSINGS) ×1 IMPLANT
DIFFUSER DRILL AIR PNEUMATIC (MISCELLANEOUS) ×2 IMPLANT
DRAPE HALF SHEET 40X57 (DRAPES) ×1 IMPLANT
DRAPE LAPAROTOMY 100X72X124 (DRAPES) ×2 IMPLANT
DRAPE SURG 17X23 STRL (DRAPES) ×4 IMPLANT
DRSG OPSITE POSTOP 4X6 (GAUZE/BANDAGES/DRESSINGS) ×1 IMPLANT
DURAPREP 26ML APPLICATOR (WOUND CARE) ×2 IMPLANT
ELECT REM PT RETURN 9FT ADLT (ELECTROSURGICAL) ×2
ELECTRODE REM PT RTRN 9FT ADLT (ELECTROSURGICAL) ×1 IMPLANT
GAUZE 4X4 16PLY RFD (DISPOSABLE) IMPLANT
GAUZE SPONGE 4X4 12PLY STRL (GAUZE/BANDAGES/DRESSINGS) ×2 IMPLANT
GLOVE BIO SURGEON STRL SZ 6.5 (GLOVE) ×2 IMPLANT
GLOVE BIOGEL PI IND STRL 6.5 (GLOVE) ×1 IMPLANT
GLOVE BIOGEL PI INDICATOR 6.5 (GLOVE) ×1
GLOVE ECLIPSE 9.0 STRL (GLOVE) ×2 IMPLANT
GLOVE EXAM NITRILE XL STR (GLOVE) IMPLANT
GOWN STRL REUS W/ TWL LRG LVL3 (GOWN DISPOSABLE) IMPLANT
GOWN STRL REUS W/ TWL XL LVL3 (GOWN DISPOSABLE) ×1 IMPLANT
GOWN STRL REUS W/TWL 2XL LVL3 (GOWN DISPOSABLE) IMPLANT
GOWN STRL REUS W/TWL LRG LVL3 (GOWN DISPOSABLE) ×2
GOWN STRL REUS W/TWL XL LVL3 (GOWN DISPOSABLE) ×2
HEMOSTAT POWDER KIT SURGIFOAM (HEMOSTASIS) ×1 IMPLANT
KIT BASIN OR (CUSTOM PROCEDURE TRAY) ×2 IMPLANT
KIT TURNOVER KIT B (KITS) ×2 IMPLANT
NDL SPNL 22GX3.5 QUINCKE BK (NEEDLE) IMPLANT
NEEDLE HYPO 22GX1.5 SAFETY (NEEDLE) ×2 IMPLANT
NEEDLE SPNL 22GX3.5 QUINCKE BK (NEEDLE) IMPLANT
NS IRRIG 1000ML POUR BTL (IV SOLUTION) ×2 IMPLANT
OIL CARTRIDGE MAESTRO DRILL (MISCELLANEOUS) ×2
PACK LAMINECTOMY NEURO (CUSTOM PROCEDURE TRAY) ×2 IMPLANT
PAD ARMBOARD 7.5X6 YLW CONV (MISCELLANEOUS) ×6 IMPLANT
SPONGE SURGIFOAM ABS GEL 100 (HEMOSTASIS) ×1 IMPLANT
SPONGE SURGIFOAM ABS GEL SZ50 (HEMOSTASIS) ×2 IMPLANT
STRIP CLOSURE SKIN 1/2X4 (GAUZE/BANDAGES/DRESSINGS) ×2 IMPLANT
SUT VIC AB 0 CT1 18XCR BRD8 (SUTURE) IMPLANT
SUT VIC AB 0 CT1 8-18 (SUTURE) ×2
SUT VIC AB 2-0 CT1 18 (SUTURE) ×2 IMPLANT
SUT VIC AB 3-0 SH 8-18 (SUTURE) ×2 IMPLANT
SYR BULB IRRIG 60ML STRL (SYRINGE) ×1 IMPLANT
TOWEL GREEN STERILE (TOWEL DISPOSABLE) ×2 IMPLANT
TOWEL GREEN STERILE FF (TOWEL DISPOSABLE) ×2 IMPLANT
WATER STERILE IRR 1000ML POUR (IV SOLUTION) ×2 IMPLANT

## 2020-08-12 NOTE — Progress Notes (Signed)
Per [place], PA placed pt in MRI safe mode of OVO for scan. Will monitor patient during MRI and change back to previous settings once scan complete and send post transmission

## 2020-08-12 NOTE — ED Notes (Signed)
Report to Erin, RN.

## 2020-08-12 NOTE — ED Notes (Signed)
Pt to MRI

## 2020-08-12 NOTE — ED Notes (Signed)
Yates MD notified of soft pressures.
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
## 2020-08-12 NOTE — Anesthesia Procedure Notes (Signed)
Procedure Name: Intubation Date/Time: 08/12/2020 2:52 PM Performed by: Lance Coon, CRNA Pre-anesthesia Checklist: Patient identified, Emergency Drugs available, Suction available, Patient being monitored and Timeout performed Patient Re-evaluated:Patient Re-evaluated prior to induction Oxygen Delivery Method: Circle system utilized Preoxygenation: Pre-oxygenation with 100% oxygen Induction Type: IV induction and Rapid sequence Laryngoscope Size: Glidescope and 4 Grade View: Grade I Tube type: Oral Tube size: 7.5 mm Airway Equipment and Method: Stylet Placement Confirmation: ETT inserted through vocal cords under direct vision,  positive ETCO2 and breath sounds checked- equal and bilateral Secured at: 21 cm Tube secured with: Tape Dental Injury: Teeth and Oropharynx as per pre-operative assessment

## 2020-08-12 NOTE — ED Notes (Signed)
First contact. Change of shift. Pt resting in bed. NADN 
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
## 2020-08-12 NOTE — Progress Notes (Signed)
PHARMACY NOTE:  ANTIMICROBIAL RENAL DOSAGE ADJUSTMENT  Current antimicrobial regimen includes a mismatch between antimicrobial dosage and estimated renal function.  As per policy approved by the Pharmacy & Therapeutics and Medical Executive Committees, the antimicrobial dosage will be adjusted accordingly.  Current antimicrobial dosage:  Cefazolin 1 gm IV Q 8 hrs X 2 doses  Indication: Post-op surgical prophylaxis  Renal Function:  Estimated Creatinine Clearance: 11 mL/min (A) (by C-G formula based on SCr of 6.9 mg/dL (H)). []      On intermittent HD, scheduled: []      On CRRT    Antimicrobial dosage has been changed to:  Cefazolin 1 gm IV Q 12 hrs X 2 doses  Thank you for allowing pharmacy to be a part of this patient's care.  Gillermina Hu, PharmD, BCPS, [facility] Clinical Pharmacist 08/12/2020 7:58 PM

## 2020-08-12 NOTE — Brief Op Note (Signed)
08/12/2020  4:24 PM  PATIENT:  Dean Bones Sr.  69 y.o. male  PRE-OPERATIVE DIAGNOSIS:  epidural hematoma  POST-OPERATIVE DIAGNOSIS:  epidural hematoma  PROCEDURE:  Procedure(s): THORACIC ELEVEN LAMINECTOMY (N/A)  SURGEON:  Surgeon(s) and Role:    * Earnie Larsson, MD - Primary    * Consuella Lose, MD - Assisting  PHYSICIAN ASSISTANT:   ASSISTANTS: none   ANESTHESIA:   general  EBL:  215 mL   BLOOD ADMINISTERED:none  DRAINS: none   LOCAL MEDICATIONS USED:  MARCAINE     SPECIMEN:  No Specimen  DISPOSITION OF SPECIMEN:  N/A  COUNTS:  YES  TOURNIQUET:  * No tourniquets in log *  DICTATION: .Dragon Dictation  PLAN OF CARE: Admit to inpatient   PATIENT DISPOSITION:  PACU - hemodynamically stable.   Delay start of Pharmacological VTE agent (>24hrs) due to surgical blood loss or risk of bleeding: yes

## 2020-08-12 NOTE — ED Notes (Signed)
Soft hypotension noted on moniter vital signs. BUE BP tested. Plunkett MD notified for change. Order are to continue to monitor. Pt in no acute distress.
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
## 2020-08-12 NOTE — Progress Notes (Signed)
Informed of MRI for today.   Device system confirmed to be MRI conditional, with implant date > 6 weeks ago and no evidence of abandoned or epicardial leads in review of most recent CXR Interrogation from today reviewed, pt is currently VS >99%.   Change device settings for MRI to OVO   Tachy-therapies to off  Program device back to pre-MRI settings after completion of exam.  Shirley Friar, PA-C  08/12/2020 11:18 AM
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
## 2020-08-12 NOTE — Anesthesia Preprocedure Evaluation (Addendum)
Anesthesia Evaluation  Patient identified by MRN, date of birth, ID band Patient awake    Reviewed: Allergy & Precautions, NPO status , Patient's Chart, lab work & pertinent test results  History of Anesthesia Complications Negative for: history of anesthetic complications  Airway Mallampati: II  TM Distance: >3 FB Neck ROM: Full    Dental  (+) Dental Advisory Given, Teeth Intact   Pulmonary asthma , sleep apnea (noncompliant) ,    Pulmonary exam normal        Cardiovascular hypertension, Pt. on medications and Pt. on home beta blockers + CAD and +CHF  Normal cardiovascular exam+ dysrhythmias + Cardiac Defibrillator    '21 TTE - EF 30 to 35%. Global hypokinesis. Mild left ventricular hypertrophy. Grade I diastolic dysfunction (impaired relaxation). Mildly elevated pulmonary artery systolic pressure. LA mildly dilated. Moderate MR.Trivial AI. Mild to moderate aortic valve sclerosis/calcification is present, without any evidence of aortic stenosis. Mild dilatation at the level of the sinuses of Valsalva measuring 39 mm.     Neuro/Psych  Epidural hematoma  negative psych ROS   GI/Hepatic negative GI ROS, Neg liver ROS,   Endo/Other  diabetes, Type 2 Hyponatremia, Na 131 Hyperkalemia, K 5.8 Obesity   Renal/GU CRF and ARFRenal disease     Musculoskeletal  (+) Arthritis ,   Abdominal   Peds  Hematology  (+) anemia ,  On eliquis - receiving Kcentra     Anesthesia Other Findings Covid test negative   Reproductive/Obstetrics                           Anesthesia Physical Anesthesia Plan  ASA: IV and emergent  Anesthesia Plan: General   Post-op Pain Management:    Induction: Intravenous and Rapid sequence  PONV Risk Score and Plan: 2 and Treatment may vary due to age or medical condition, Ondansetron and Dexamethasone  Airway Management Planned: Oral ETT and Video Laryngoscope  Planned  Additional Equipment: Arterial line  Intra-op Plan:   Post-operative Plan: Extubation in OR  Informed Consent: I have reviewed the patients History and Physical, chart, labs and discussed the procedure including the risks, benefits and alternatives for the proposed anesthesia with the patient or authorized representative who has indicated his/her understanding and acceptance.     Dental advisory given  Plan Discussed with: CRNA and Anesthesiologist  Anesthesia Plan Comments:       Anesthesia Quick Evaluation

## 2020-08-12 NOTE — Op Note (Signed)
Date of procedure: 08/12/2020  Date of dictation: Same  Service: Neurosurgery  Preoperative diagnosis: T10/T11 epidural hematoma with myelopathy  Postoperative diagnosis: Same  Procedure Name: T10-T11 decompressive laminectomy with evacuation of epidural hematoma  Surgeon:Nathanael Krist A.Rutilio Yellowhair, M.D.  Asst. Surgeon: Kathyrn Sheriff  Anesthesia: General  Indication: 69 year old male 6 weeks status post T11 fracture secondary to trauma.  Patient initially responded well with bracing and mobilization.  Approximately 2 weeks ago he had increased back pain and suffered a subsequent fall.  Since that time he had progressive bilateral lower extremity sensory loss and weakness.  MRI scanning today demonstrated evidence of a dorsal epidural hematoma which appears to be at least partially liquefied causing marked compression of thecal sac and spinal cord.  Patient presents now for emergent decompressive surgery.  Operative note: After induction of anesthesia, patient position prone onto bolsters and properly padded.  Thoracic and lumbar region prepped and draped sterilely.  Incision made overlying T10 and T11.  Dissection performed bilaterally.  Retractor placed.  X-ray taken.  Level confirmed.  Decompressive laminectomy was then performed using [facility] a high-speed drill to remove the lamina of T10 and the lamina of T11.  Medial facetectomies were performed.  Ligament flavum elevated and resected.  Underlying thecal sac was identified.  There was a tough rind of inflammatory tissue around the thecal sac which was bloodstained.  There were 2 moderately large epidural collections consistent with chronic hemorrhage which were dissected free and resected.  The epidural rind was dissected free from the underlying thecal sac.  The spinal cord appeared to be well decompressed.  There was no evidence of injury to the dura, spinal cord or nerve roots.  Wound is then irrigated with antibiotic solution.  A  medium Hemovac drain was left in the deep wound space.  Wound is then closed in layers with Vicryl sutures.  Steri-Strips and sterile dressing were applied.  No apparent complications.  Patient tolerated the procedure well and he returns to the recovery room postop.

## 2020-08-12 NOTE — Anesthesia Postprocedure Evaluation (Signed)
Anesthesia Post Note  Patient: Dean Bones Sr.  Procedure(s) Performed: THORACIC ELEVEN LAMINECTOMY (N/A Spine Thoracic)     Patient location during evaluation: PACU Anesthesia Type: General Level of consciousness: awake and alert Pain management: pain level controlled Vital Signs Assessment: post-procedure vital signs reviewed and stable Respiratory status: spontaneous breathing, nonlabored ventilation and respiratory function stable Cardiovascular status: blood pressure returned to baseline and stable Postop Assessment: no apparent nausea or vomiting Anesthetic complications: no   No complications documented.  Last Vitals:  Vitals:   08/12/20 1645 08/12/20 1700  BP: (!) 122/37 (!) 120/37  Pulse: 73 69  Resp: 15 12  Temp:    SpO2: 100% 100%    Last Pain:  Vitals:   08/12/20 1645  TempSrc:   PainSc: (P) 0-No pain                 Neola Worrall,W. EDMOND

## 2020-08-12 NOTE — Progress Notes (Signed)
Received patient to room 3E5. Transferred to bed and removed linens from under the patient. Dr. Annette Stable came in stating they will take him for emergency surgery immediately, as MRI revealed epidural hematoma at T-11. No sooner did the physician leave did I receive a call from OR stating they will be up to pick up patient. No sooner did I hang up than transporter from OR arrived and took patient. Consent not obtained, attempted to call report to ext [phone] but no answer. Patient was able to call family (daughter) and notify them of the plan.

## 2020-08-12 NOTE — Anesthesia Procedure Notes (Signed)
Arterial Line Insertion Start/End11/18/2021 2:58 PM, 08/12/2020 3:04 PM Performed by: Audry Pili, MD, anesthesiologist  Patient location: Pre-op. Preanesthetic checklist: patient identified, IV checked, risks and benefits discussed, surgical consent, monitors and equipment checked, pre-op evaluation, timeout performed and anesthesia consent Lidocaine 1% used for infiltration and patient sedated Left, radial was placed Catheter size: 20 G Hand hygiene performed   Attempts: 3 (Previous attempts by CRNA on bilateral radial arteries unsuccessful) Procedure performed using ultrasound guided technique. Ultrasound Notes:anatomy identified, needle tip was noted to be adjacent to the nerve/plexus identified, no ultrasound evidence of intravascular and/or intraneural injection and image(s) printed for medical record Following insertion, dressing applied and Biopatch. Post procedure assessment: unchanged and normal  Patient tolerated the procedure well with no immediate complications.

## 2020-08-12 NOTE — Transfer of Care (Signed)
Immediate Anesthesia Transfer of Care Note  Patient: Dean [name].  Procedure(s) Performed: THORACIC ELEVEN LAMINECTOMY (N/A Spine Thoracic)  Patient Location: PACU  Anesthesia Type:General  Level of Consciousness: drowsy and patient cooperative  Airway & Oxygen Therapy: Patient Spontanous Breathing  Post-op Assessment: Report given to RN and Post -op Vital signs reviewed and stable  Post vital signs: Reviewed and stable  Last Vitals:  Vitals Value Taken Time  BP    Temp    Pulse    Resp    SpO2      Last Pain:  Vitals:   08/12/20 1327  TempSrc: Oral  PainSc:          Complications: No complications documented.

## 2020-08-12 NOTE — ED Notes (Signed)
14 fr foley catheter inserted using sterile technique per MD order.

## 2020-08-12 NOTE — H&P (Signed)
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 patient will require hospital care that crosses at least 2 midnights to treat this condition based on the medical complexity of the problems presented.  Given the aforementioned information, the predictability of an adverse outcome is felt to be significant.    Karmen Bongo MD [facility]   How to contact the [facility] Attending or Consulting provider [place] or covering provider during after hours [place], for this patient?  1. Check the care team in [facility] and look for a) attending/consulting [facility] provider listed and b) the [facility] team listed 2. Log into [email or web address] and use [place]'s universal password to access. If you do not have the password, please contact the hospital operator. 3. Locate the [facility] provider you are looking for under [facility] and page to a number that you can be directly reached. 4. If you still have difficulty reaching the provider, please page the [facility] (Director on Call) for the Hospitalists listed on amion for assistance.   08/12/2020, 5:45 PM

## 2020-08-13 ENCOUNTER — Encounter (HOSPITAL_COMMUNITY): Payer: Self-pay | Admitting: Neurosurgery

## 2020-08-13 DIAGNOSIS — S064X9A Epidural hemorrhage with loss of consciousness of unspecified duration, initial encounter: Secondary | ICD-10-CM

## 2020-08-13 DIAGNOSIS — S064X9S Epidural hemorrhage with loss of consciousness of unspecified duration, sequela: Secondary | ICD-10-CM

## 2020-08-13 LAB — CBC
HCT: 27.7 % — ABNORMAL LOW (ref 39.0–52.0)
Hemoglobin: 8.9 g/dL — ABNORMAL LOW (ref 13.0–17.0)
MCH: 29.3 pg (ref 26.0–34.0)
MCHC: 32.1 g/dL (ref 30.0–36.0)
MCV: 91.1 fL (ref 80.0–100.0)
Platelets: 148 10*3/uL — ABNORMAL LOW (ref 150–400)
RBC: 3.04 MIL/uL — ABNORMAL LOW (ref 4.22–5.81)
RDW: 13.8 % (ref 11.5–15.5)
WBC: 11.5 10*3/uL — ABNORMAL HIGH (ref 4.0–10.5)
nRBC: 0 % (ref 0.0–0.2)

## 2020-08-13 LAB — BASIC METABOLIC PANEL
Anion gap: 11 (ref 5–15)
Anion gap: 13 (ref 5–15)
BUN: 130 mg/dL — ABNORMAL HIGH (ref 8–23)
BUN: 111 mg/dL — ABNORMAL HIGH (ref 8–23)
CO2: 16 mmol/L — ABNORMAL LOW (ref 22–32)
CO2: 18 mmol/L — ABNORMAL LOW (ref 22–32)
Calcium: 10.1 mg/dL (ref 8.9–10.3)
Calcium: 10.5 mg/dL — ABNORMAL HIGH (ref 8.9–10.3)
Chloride: 105 mmol/L (ref 98–111)
Chloride: 103 mmol/L (ref 98–111)
Creatinine, Ser: 5.06 mg/dL — ABNORMAL HIGH (ref 0.61–1.24)
Creatinine, Ser: 3.94 mg/dL — ABNORMAL HIGH (ref 0.61–1.24)
GFR, Estimated: 12 mL/min — ABNORMAL LOW (ref >60)
GFR, Estimated: 16 mL/min — ABNORMAL LOW (ref >60)
Glucose, Bld: 180 mg/dL — ABNORMAL HIGH (ref 70–99)
Glucose, Bld: 188 mg/dL — ABNORMAL HIGH (ref 70–99)
Potassium: 6.1 mmol/L — ABNORMAL HIGH (ref 3.5–5.1)
Potassium: 6 mmol/L — ABNORMAL HIGH (ref 3.5–5.1)
Sodium: 132 mmol/L — ABNORMAL LOW (ref 135–145)
Sodium: 134 mmol/L — ABNORMAL LOW (ref 135–145)

## 2020-08-13 LAB — GLUCOSE, CAPILLARY: Glucose-Capillary: 171 mg/dL — ABNORMAL HIGH (ref 70–99)

## 2020-08-13 LAB — MAGNESIUM: Magnesium: 2.1 mg/dL (ref 1.7–2.4)

## 2020-08-13 MED ORDER — ALLOPURINOL 100 MG PO TABS
100.0000 mg | ORAL_TABLET | Freq: Every day | ORAL | Status: DC
  Administered 2020-08-14 – 2020-09-02 (×20): 100 mg via ORAL
  Filled 2020-08-13 (×20): qty 1

## 2020-08-13 MED ORDER — SODIUM ZIRCONIUM CYCLOSILICATE 5 G PO PACK
5.0000 g | PACK | Freq: Once | ORAL | Status: AC
  Administered 2020-08-13: 5 g via ORAL
  Filled 2020-08-13: qty 1

## 2020-08-13 MED ORDER — INFLUENZA VAC A&B SA ADJ QUAD 0.5 ML IM PRSY
0.5000 mL | PREFILLED_SYRINGE | INTRAMUSCULAR | Status: DC
  Filled 2020-08-13: qty 0.5

## 2020-08-13 NOTE — Progress Notes (Signed)
Postop day 1.  No new problems or trouble overnight.  Patient resting comfortably.  Denies back pain.  Not having any lower extremity pain.  Feels improved from preop.  Afebrile.  His vital signs are stable.  Drain output is minimal.  Urine output is good.  He is awake and alert.  He is oriented and appropriate.  Motor examination significantly improved in both lower extremities.  Patient now with for over 5 strength in both hip flexors and knee extensors.  Patient with 4/5 strength in his right dorsiflexors and 2-3 over 5 strength in his left dorsiflexors.  4+/5 strength in both plantar flexors.  Wound clean and dry.  Abdomen soft.  Overall progressing well following emergent decompression for spontaneous epidural hematoma.  Mobilize with therapy.  No need for bracing at this point.
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
## 2020-08-13 NOTE — Progress Notes (Signed)
Patient alert and oriented x 4 upon assessment this morning. Resps even and unlabored, breath sounds clear though difficult to auscultate in lower lobes due to body habitus. Post op dressing to left mid thoracic spine area is clean, dry and intact with hemovac drain in place. Drain with small amount of sanguinous output. Bowel sounds are normoactive in all quads; no BM yet today although med given to help decrease potassium, which was 6.1 this am. Patient is using a urinal and has had good output since foley was removed this morning at 0515. Patient has been repositioned in bed with assist x 2. He is still exhibiting overall weakness and deconditioning, but he is also concerned with accidentally disturbing the dressing and/or drain. His BLE strength has improved significantly since yesterday as well as his sensation, but he is still weak. Patient denies pain at this time, mainly just what he describes as "tenderness" in the surgical area; he denied the need for pain meds or intervention. Therapy assisted patient to chair with use of the [facility]; he is sitting up watching TV at this time. Call light in reach, will monitor.

## 2020-08-13 NOTE — Progress Notes (Signed)
Inpatient Rehab Admissions Coordinator Note:   Per OT recommendations, pt was screened for CIR candidacy by Shann Medal, PT, DPT.  At this time we are recommending a CIR consult and I will place an order per our protocol.  Please contact me with questions.   Shann Medal, PT, DPT [phone] 08/13/20 1:12 PM
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
## 2020-08-13 NOTE — Consult Note (Signed)
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 foraminal stenosis. L3-L4: Interval posterior decompression. No more than mild residual canal stenosis, improved from prior. Circumferential disc bulge and bilateral facet hypertrophy results in moderate bilateral foraminal stenosis, slightly progressed from prior. L4-L5: Interval posterior decompression. No significant residual canal stenosis, improved from prior. Mild circumferential disc bulge and bilateral facet hypertrophy resulting in mild bilateral foraminal stenosis, right greater than left. L5-S1: Moderate right worse than left facet arthropathy. Unremarkable  disc. No foraminal or canal stenosis. IMPRESSION: 1. Acute to subacute transversely oriented fracture through the superior aspect of the T11 vertebral body with fracture lines extending into the posterior elements (Chance type fracture). Associated anteroinferior compression fracture of T10. There is disruption of the disc space posteriorly at T10-11 without definite disruption of the posterior longitudinal ligament. There is tearing of the interspinous ligament and supraspinous ligament. 2. Posterior epidural  hematoma on the left at the T10-11 level measuring 11 x 6 x 22 mm resulting in leftward deviation and compression of the cord. There is cord edema extending from approximately the T9-10 to the T11-12 levels. Emergent neurosurgical evaluation is recommended. 3. Additional epidural fluid collection within the upper thoracic spine extending from approximately the T2-3 level to approximately T5-6 resulting in mild compression and leftward deviation of the thoracic cord. 4. Interval posterior decompression from L2 through L5. No more than mild residual canal stenosis at these levels. Moderate bilateral foraminal stenosis at L3-4 is slightly progressed from prior. Critical Value/emergent results were called by telephone at the time of interpretation on 08/12/2020 at 12:56 pm to provider [facility] , who verbally acknowledged these results. Electronically Signed   By: Davina Poke D.O.   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  Records reviewed and summated above.  1. Does the need for close, 24 hr/day medical supervision in concert with the patient's rehab needs make it unreasonable for this patient to be served in a less intensive setting? Yes  2. Co-Morbidities requiring supervision/potential complications: DM with hyperglycemia (Monitor in accordance with exercise and adjust meds as necessary), OSA with CPAP, HTN (monitor and provide prns in accordance with increased physical exertion and pain), hyperlipidemia, CAD, atrial fibrillation, systolic congestive heart failure with AICD and history of cardiac arrest 2017 (Monitor in accordance with increased physical activity and avoid UE resistance excercises), leukocytosis (repeat labs, cont to monitor for signs and symptoms of infection, further workup if indicated), hyperkalemia (repeat labs, lokelma). 3. Due to bladder management, bowel  management, safety, skin/wound care, disease management, pain management and patient education, does the patient require 24 hr/day rehab nursing? Yes 4. Does the patient require coordinated care of a physician, rehab nurse, therapy disciplines of PT/OT to address physical and functional deficits in the context of the above medical diagnosis(es)? Yes Addressing deficits in the following areas: balance, endurance, locomotion, strength, transferring, bathing, dressing, toileting and psychosocial support 5. Can the patient actively participate in an intensive therapy program of at least 3 hrs of therapy per day at least 5 days per week? Yes 6. The potential for patient to make measurable gains while on inpatient rehab is excellent 7. Anticipated functional outcomes upon discharge from inpatient rehab are supervision  with PT, supervision and min assist with OT, n/a with SLP. 8. Estimated rehab length of stay to reach the above functional goals is: 12-16 days. 9. Anticipated discharge destination: Home 10. Overall Rehab/Functional Prognosis: excellent  RECOMMENDATIONS: This patient's condition is appropriate for continued rehabilitative care in the following setting: CIR Patient has agreed to participate in recommended program. Yes Note that insurance prior authorization may be required for reimbursement for recommended care.  Comment: Rehab Admissions Coordinator to follow up.  I have personally performed a face to face diagnostic evaluation, including, but not limited to relevant history and physical exam findings, of this patient and developed relevant assessment and plan.  Additionally, I have reviewed  and concur with the physician assistant's documentation above.   Delice Lesch, MD, ABPMR Lavon Paganini Angiulli, PA-C 08/13/2020
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
## 2020-08-14 DIAGNOSIS — S064X9S Epidural hemorrhage with loss of consciousness of unspecified duration, sequela: Secondary | ICD-10-CM | POA: Diagnosis not present

## 2020-08-14 LAB — BASIC METABOLIC PANEL
Anion gap: 9 (ref 5–15)
BUN: 95 mg/dL — ABNORMAL HIGH (ref 8–23)
CO2: 20 mmol/L — ABNORMAL LOW (ref 22–32)
Calcium: 10.2 mg/dL (ref 8.9–10.3)
Chloride: 105 mmol/L (ref 98–111)
Creatinine, Ser: 2.77 mg/dL — ABNORMAL HIGH (ref 0.61–1.24)
GFR, Estimated: 24 mL/min — ABNORMAL LOW (ref >60)
Glucose, Bld: 147 mg/dL — ABNORMAL HIGH (ref 70–99)
Potassium: 6.1 mmol/L — ABNORMAL HIGH (ref 3.5–5.1)
Sodium: 134 mmol/L — ABNORMAL LOW (ref 135–145)

## 2020-08-14 LAB — CBC
HCT: 28.5 % — ABNORMAL LOW (ref 39.0–52.0)
Hemoglobin: 9.2 g/dL — ABNORMAL LOW (ref 13.0–17.0)
MCH: 29.3 pg (ref 26.0–34.0)
MCHC: 32.3 g/dL (ref 30.0–36.0)
MCV: 90.8 fL (ref 80.0–100.0)
Platelets: 154 10*3/uL (ref 150–400)
RBC: 3.14 MIL/uL — ABNORMAL LOW (ref 4.22–5.81)
RDW: 14 % (ref 11.5–15.5)
WBC: 15 10*3/uL — ABNORMAL HIGH (ref 4.0–10.5)
nRBC: 0 % (ref 0.0–0.2)

## 2020-08-14 MED ORDER — MAGNESIUM CITRATE PO SOLN
1.0000 | Freq: Once | ORAL | Status: AC
  Administered 2020-08-14: 1 via ORAL
  Filled 2020-08-14: qty 296

## 2020-08-14 MED ORDER — SODIUM ZIRCONIUM CYCLOSILICATE 10 G PO PACK
10.0000 g | PACK | Freq: Once | ORAL | Status: AC
  Administered 2020-08-14: 10 g via ORAL
  Filled 2020-08-14: qty 1

## 2020-08-14 MED ORDER — POLYETHYLENE GLYCOL 3350 17 G PO PACK
17.0000 g | PACK | Freq: Two times a day (BID) | ORAL | Status: DC
  Administered 2020-08-14 – 2020-09-01 (×16): 17 g via ORAL
  Filled 2020-08-14 (×28): qty 1

## 2020-08-14 MED ORDER — MAGNESIUM CITRATE PO SOLN
1.0000 | Freq: Once | ORAL | Status: DC
  Filled 2020-08-14: qty 296

## 2020-08-14 NOTE — Progress Notes (Signed)
Inpatient Rehab Admissions:  Inpatient Rehab Consult received.  I met with patient at the bedside for rehabilitation assessment and to discuss goals and expectations of an inpatient rehab admission.  Pt acknowledged understanding of CIR goals and expectations.  Pt interested in pursuing CIR.  Pt appears to be an appropriate candidate for potential admission.  Pt gave permission to speak to daughter, Donavan Burnet.  AC called and no one answered the phone; unable to leave voicemail due to mailbox being full. Awaiting return call.  Will continue to follow.  Signed: Gayland Curry, [place], [place] Admissions Coordinator [phone]

## 2020-08-14 NOTE — Progress Notes (Addendum)
Patient alert and oriented x 4. Resps even and unlabored, clear lung sounds throughout but lower lobes difficult to auscultate due to body habitus. Abdomen soft, nontender but slightly distended. Patient reports no BM since prior to hospitalization, around the 16th or 17th of November. As such, and due to his current disease/injury process, potassium level remains elevated. Second dose of Lokelma given this AM along with Miralax and colace; MagCitrate is ordered but not currently available. Pharmacy has been contacted to deliver to unit, and this will be administered once available. Patient attempted x 2 yesterday to have a BM without success. He remains asymptomatic from the hyperkalemia at this time. Patient is currently utilizing a condom cath for convenience, as he spends most of the day in bed and finds it difficult to urinate lying down without making a mess. He will be assisted to the bedside commode x 2 with use of the [facility]; therapy did not feel it was appropriate or that he had enough strength to ambulate at this time. While patient is listed as moderate fall risk due to risk factors, he is alert and oriented and does not try to get up without calling for assist. Goal for him today is to sit up for lunch and to have a BM. Neurochecks to BLE show slight improvement from yesterday. Hemovac drain to the thoracic surgical site is intact and draining small amount of sanguinous fluid. Neurosurgeon stated that once patient is up and moving around more, drain will be able to be pulled. Patient is more quickly able to move feet and lift legs, though he still exhibits some weakness against resistance as well as with dorsi- and plantarflexion, especially on the left side where his foot drop is still evident. Patient denies pain and has not required any PRN meds for pain control. Call light is in reach, will continue to monitor.

## 2020-08-14 NOTE — Progress Notes (Signed)
Patient had a large bowel movement on BSC. Initial stool was small to medium and dry/cracked, solid and formed, followed by large amount of mushy/creamy stool. Assisted back to bed x 2. Patient appears to be somewhat freightened of falling again which makes getting ready to transfer very difficult and a long process. Patient appears to have to "psyche" himself up. This may also be due to his desire to be independent and think he can do  More than what he can/thinks he is stronger than what he is currently/ He is very pleasant and cooperative but there is some hesitation in transfers and walking/stepping. Hopefully continued PT can assist with these issues and bring him greater confidence.[name]

## 2020-08-14 NOTE — Progress Notes (Signed)
Postop day 2.  Patient with minimal pain, much improved from preop.  Strength and sensation continue to improve in his lower extremities.  Stood at the bedside yesterday but did not attempt ambulation with therapy.  Drain output minimal.  Motor examination with 4+/5 strength except his dorsiflexors are 4-/5 on the right and 3/5 on the left.  Wound clean and dry.  Chest and abdomen benign.  Overall progressing well following thoracic laminectomy for epidural hematoma.  Patient with persistent acute renal failure with hyperkalemia being managed by his primary team.  No new recommendations from my standpoint.
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
## 2020-08-14 NOTE — Progress Notes (Signed)
Requested the postponement of flu vaccine administration to minimize the rare but not impossible complication of monitoring patient's neuro status progression (Guillain-Barre) in the setting of current neuro status and s/p epidural hematoma excision.
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
## 2020-08-15 DIAGNOSIS — S064X9S Epidural hemorrhage with loss of consciousness of unspecified duration, sequela: Secondary | ICD-10-CM | POA: Diagnosis not present

## 2020-08-15 LAB — CBC
HCT: 27.5 % — ABNORMAL LOW (ref 39.0–52.0)
Hemoglobin: 8.7 g/dL — ABNORMAL LOW (ref 13.0–17.0)
MCH: 28.8 pg (ref 26.0–34.0)
MCHC: 31.6 g/dL (ref 30.0–36.0)
MCV: 91.1 fL (ref 80.0–100.0)
Platelets: 162 10*3/uL (ref 150–400)
RBC: 3.02 MIL/uL — ABNORMAL LOW (ref 4.22–5.81)
RDW: 14 % (ref 11.5–15.5)
WBC: 12.7 10*3/uL — ABNORMAL HIGH (ref 4.0–10.5)
nRBC: 0 % (ref 0.0–0.2)

## 2020-08-15 LAB — BASIC METABOLIC PANEL
Anion gap: 9 (ref 5–15)
BUN: 79 mg/dL — ABNORMAL HIGH (ref 8–23)
CO2: 24 mmol/L (ref 22–32)
Calcium: 10.6 mg/dL — ABNORMAL HIGH (ref 8.9–10.3)
Chloride: 104 mmol/L (ref 98–111)
Creatinine, Ser: 2.1 mg/dL — ABNORMAL HIGH (ref 0.61–1.24)
GFR, Estimated: 33 mL/min — ABNORMAL LOW (ref >60)
Glucose, Bld: 136 mg/dL — ABNORMAL HIGH (ref 70–99)
Potassium: 5.6 mmol/L — ABNORMAL HIGH (ref 3.5–5.1)
Sodium: 137 mmol/L (ref 135–145)

## 2020-08-15 MED ORDER — SODIUM ZIRCONIUM CYCLOSILICATE 10 G PO PACK
10.0000 g | PACK | Freq: Every day | ORAL | Status: AC
  Administered 2020-08-15: 10 g via ORAL
  Filled 2020-08-15: qty 1

## 2020-08-15 NOTE — Progress Notes (Signed)
Overall stable.  No new issues or problems.  Patient denies any back pain.  He is having no lower extremity pain.  He was able to stand and take a few steps with therapy yesterday.  They are recommending CIR for further convalescence.  On a very encouraging note his renal function is much improved.  His creatinine is trended down and is now just over 2.  Awake and alert.  Oriented and appropriate.  Motor and sensory function intact.  Wound clean and dry.  Chest and abdomen benign.  Motor examination with 4+/5 strength in both hip flexors quadriceps and plantar flexors bilaterally.  3-4 over 5 right dorsiflexors 1over 5 left dorsiflexors.   Overall doing well.  Continue efforts at mobilization and therapy.  I agree the patient is a good candidate for inpatient rehab.
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
## 2020-08-16 DIAGNOSIS — I482 Chronic atrial fibrillation, unspecified: Secondary | ICD-10-CM

## 2020-08-16 DIAGNOSIS — S064X9S Epidural hemorrhage with loss of consciousness of unspecified duration, sequela: Secondary | ICD-10-CM | POA: Diagnosis not present

## 2020-08-16 LAB — CBC
HCT: 27.7 % — ABNORMAL LOW (ref 39.0–52.0)
Hemoglobin: 8.8 g/dL — ABNORMAL LOW (ref 13.0–17.0)
MCH: 28.8 pg (ref 26.0–34.0)
MCHC: 31.8 g/dL (ref 30.0–36.0)
MCV: 90.5 fL (ref 80.0–100.0)
Platelets: 154 10*3/uL (ref 150–400)
RBC: 3.06 MIL/uL — ABNORMAL LOW (ref 4.22–5.81)
RDW: 14 % (ref 11.5–15.5)
WBC: 13.1 10*3/uL — ABNORMAL HIGH (ref 4.0–10.5)
nRBC: 0 % (ref 0.0–0.2)

## 2020-08-16 LAB — BASIC METABOLIC PANEL
Anion gap: 13 (ref 5–15)
BUN: 59 mg/dL — ABNORMAL HIGH (ref 8–23)
CO2: 21 mmol/L — ABNORMAL LOW (ref 22–32)
Calcium: 10.3 mg/dL (ref 8.9–10.3)
Chloride: 102 mmol/L (ref 98–111)
Creatinine, Ser: 1.71 mg/dL — ABNORMAL HIGH (ref 0.61–1.24)
GFR, Estimated: 43 mL/min — ABNORMAL LOW (ref >60)
Glucose, Bld: 190 mg/dL — ABNORMAL HIGH (ref 70–99)
Potassium: 5.2 mmol/L — ABNORMAL HIGH (ref 3.5–5.1)
Sodium: 136 mmol/L (ref 135–145)

## 2020-08-16 NOTE — Assessment & Plan Note (Addendum)
-   considered reactive and demargination from steroids since surgery; low suspicion for infection - continue holding off on abx; remains afebrile  -CXR obtained on 08/20/2020 which is also clear -He remains nontoxic-appearing, will continue monitoring - starting to downtrend as of 11/29

## 2020-08-16 NOTE — Assessment & Plan Note (Addendum)
-   2/2 AKI however as renal function has improved significantly, he is still having intermittent hyperkalemia. After record review and talking with patient further in depth he notes his Dean [name] was stopped in the past due to elevated potassium as well - due to his K again rising after restarting Entresto, will now discontinue once again - d/c lokelma and Entresto - monitor BMP; will treat hyperK further if indicated

## 2020-08-16 NOTE — Assessment & Plan Note (Signed)
-  A1c was 5.6 on 10/23/19 -Will not follow unless glucose significantly elevates on labs

## 2020-08-16 NOTE — Assessment & Plan Note (Signed)
-   patient not on CPAP

## 2020-08-16 NOTE — Progress Notes (Signed)
Inpatient Rehab Admissions Coordinator:  Attempted to call pt's daughter, Donavan Burnet. Got no answer and was unable to leave voicemail.  Called pt's daughter, Sheran Lawless.  Explained CIR goals and expectations to her.  She acknowledged understanding.  She indicated that she and family would be able to provide pt with assistance after discharge from hospital.  She is interested in pursuing CIR for pt.  Will continue to follow.   Gayland Curry, [place], [place] Admissions Coordinator [phone]

## 2020-08-16 NOTE — Assessment & Plan Note (Addendum)
-  MRI performed withEpidural hematoma with acute cord compression, 2.2 cm in length. -Eliquis reversed, emergent surgery on 08/12/2020 with laminectomy and evacuation per neurosurgery -Dr. Annette Stable -PT/OT following per neurosurgery recommendations, no bracing indicated per their note -PT currently recommending CIR; CIR is out of network, therefore different rehab venue being sought at this time -Discussed with neurosurgery on 08/18/2020.  Okay to remove back drain - steroid taper ordered on 11/25 (discussed with NSG)

## 2020-08-16 NOTE — Progress Notes (Signed)
Patient continues to do reasonably well.  No back pain.  Neurologic status stable.  Wound healing well.    Continue efforts at mobilization with therapy.  Hopefully patient will be transferred to rehabilitation  soon.  Patient may be restarted on his Eliquis 10 days postop which would be Saturday.

## 2020-08-16 NOTE — Assessment & Plan Note (Signed)
-   continue lipitor ?

## 2020-08-16 NOTE — Assessment & Plan Note (Signed)
-  h/o cardiac arrest in 2017 -05/05/20 echo with EF 30-35% and grade 1 diastolic dysfunction -Has AICD which is MRI-compatible
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
## 2020-08-16 NOTE — Assessment & Plan Note (Addendum)
-  Rapid reversal order set was used, with [name] reversal for surgery -Rate control with Amiodarone -Eliquis restarted 10 days post-op; 08/21/20

## 2020-08-16 NOTE — Assessment & Plan Note (Addendum)
-  Resumed Coreg; BP on soft end now may need to decrease coreg in setting of starting Bidil - entresto has been discontinued due to recurrent hyperK when resumed; can follow up outpatient with cardiology
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
## 2020-08-16 NOTE — Assessment & Plan Note (Signed)
-   Body mass index is 35.65 kg/m. - ongoing weight loss/diet encouraged

## 2020-08-16 NOTE — Progress Notes (Signed)
OT Cancellation Note  Patient Details Name: Dean Shearn Sr. MRN: [id number] DOB: 14-Jun-1951   Cancelled Treatment:    Reason Eval/Treat Not Completed: Patient declined, no reason specified. Pt declined this session due to fatigue and not receiving much rest from the night before, even with encouragement pt still declines. Will continue to follow at a later date.    Hoy Morn, [place], OTR/L  Supplemental Rehabilitation Services  [phone]  08/16/2020, 2:41 PM

## 2020-08-16 NOTE — Assessment & Plan Note (Addendum)
-  Likely associated with spinal cord compression -> decreased PO intake -> renal failure: Improving with IV fluids and increased p.o. intake - baseline creat 1.2 - 1.4 -Hold nephrotoxic medications including Aldactone, Lasix -Renal function improved back to normal then started to uptrend after IVF were stopped as patient was not taking in good PO (he was again encouraged to work on his nutrition status and fluid intake) -continue Coreg - see hyperK regarding Entresto
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
## 2020-08-17 DIAGNOSIS — S064X9S Epidural hemorrhage with loss of consciousness of unspecified duration, sequela: Secondary | ICD-10-CM | POA: Diagnosis not present

## 2020-08-17 LAB — CBC
HCT: 28.6 % — ABNORMAL LOW (ref 39.0–52.0)
Hemoglobin: 9.1 g/dL — ABNORMAL LOW (ref 13.0–17.0)
MCH: 28.9 pg (ref 26.0–34.0)
MCHC: 31.8 g/dL (ref 30.0–36.0)
MCV: 90.8 fL (ref 80.0–100.0)
Platelets: 174 10*3/uL (ref 150–400)
RBC: 3.15 MIL/uL — ABNORMAL LOW (ref 4.22–5.81)
RDW: 13.9 % (ref 11.5–15.5)
WBC: 14 10*3/uL — ABNORMAL HIGH (ref 4.0–10.5)
nRBC: 0.2 % (ref 0.0–0.2)

## 2020-08-17 LAB — BASIC METABOLIC PANEL
Anion gap: 9 (ref 5–15)
BUN: 45 mg/dL — ABNORMAL HIGH (ref 8–23)
CO2: 22 mmol/L (ref 22–32)
Calcium: 10 mg/dL (ref 8.9–10.3)
Chloride: 101 mmol/L (ref 98–111)
Creatinine, Ser: 1.48 mg/dL — ABNORMAL HIGH (ref 0.61–1.24)
GFR, Estimated: 51 mL/min — ABNORMAL LOW (ref >60)
Glucose, Bld: 139 mg/dL — ABNORMAL HIGH (ref 70–99)
Potassium: 5.5 mmol/L — ABNORMAL HIGH (ref 3.5–5.1)
Sodium: 132 mmol/L — ABNORMAL LOW (ref 135–145)

## 2020-08-17 MED ORDER — SODIUM ZIRCONIUM CYCLOSILICATE 10 G PO PACK
10.0000 g | PACK | Freq: Once | ORAL | Status: AC
  Administered 2020-08-17: 10 g via ORAL
  Filled 2020-08-17: qty 1

## 2020-08-17 NOTE — TOC Progression Note (Signed)
Transition of Care (TOC) - Progression Note    Patient Details  Name: Dean Berry Sr. MRN: [id number] Date of Birth: 03-Sep-1951  Transition of Care [facility]) CM/SW Contact  Bartholomew Crews, RN Phone Number: [phone] 08/17/2020, 4:22 PM  Clinical Narrative:     Notified by Claiborne Billings with CIR that patient's health plan is out of network with CIR. NCM reached out to Jace with [facility] and Pam with [facility] to inquire about in patient status. NCM attempted to contact [facility], but unable to reach real person at this time in order to inquire about eligible facilities. TOC following for transition needs.        Expected Discharge Plan and Services                                                 Social Determinants of Health (SDOH) Interventions    Readmission Risk Interventions Readmission Risk Prevention Plan 06/07/2019  Transportation Screening Complete  PCP or Specialist Appt within 3-5 Days Complete  HRI or [place] Complete  Social Work Consult for [place] Planning/Counseling Complete  Palliative Care Screening Not Applicable  Medication Review [employer]) Complete  Some recent data might be hidden
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
## 2020-08-17 NOTE — Progress Notes (Signed)
Pt became upset with staff after having a bowel movement in the bed. Staff informed pt he had already begun to have a bowel movement and the safest option would be for him to complete his bowel movement and then staff would assist him in getting cleaned up. Pt began yelling and continued to make rude remarks to staff. This RN asked the pt to please reframed from making remarks to staff; pt refused and continued to make comments and asked this RN to leave the room and continue to yell at PCT. Will continue to monitor pt.

## 2020-08-17 NOTE — Progress Notes (Signed)
Inpatient Rehabilitation-Admissions Coordinator   Following up for my co-worker Scherrie Gerlach. Discussed insurance benefits with the patient. According to our benefits check, it appears his insurance is out of network with our IP Rehab program and acceptance to [facility] could be costly. The patient would like to inquire about other CIR programs that are in network as the cost may be more affordable. AC has communicated his request to [facility] team and will follow along.   *His insurance case was initiated yesterday and the Josem Kaufmann is pending. IF pt chooses another rehab facility, please notify me to close his current authorization case.   Raechel Ache, OTR/L  Rehab Admissions Coordinator  [phone] 08/17/2020 3:56 PM
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
## 2020-08-17 NOTE — Care Management Important Message (Signed)
Important Message  Patient Details  Name: Dean Risse Sr. MRN: [id number] Date of Birth: 06-23-1951   Medicare Important Message Given:  Yes     Shelda Altes 08/17/2020, 10:48 AM
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
## 2020-08-18 DIAGNOSIS — S064X9S Epidural hemorrhage with loss of consciousness of unspecified duration, sequela: Secondary | ICD-10-CM | POA: Diagnosis not present

## 2020-08-18 LAB — BASIC METABOLIC PANEL
Anion gap: 13 (ref 5–15)
BUN: 40 mg/dL — ABNORMAL HIGH (ref 8–23)
CO2: 19 mmol/L — ABNORMAL LOW (ref 22–32)
Calcium: 9.7 mg/dL (ref 8.9–10.3)
Chloride: 101 mmol/L (ref 98–111)
Creatinine, Ser: 1.23 mg/dL (ref 0.61–1.24)
GFR, Estimated: >60 mL/min (ref >60)
Glucose, Bld: 158 mg/dL — ABNORMAL HIGH (ref 70–99)
Potassium: 5.1 mmol/L (ref 3.5–5.1)
Sodium: 133 mmol/L — ABNORMAL LOW (ref 135–145)

## 2020-08-18 LAB — CBC
HCT: 28 % — ABNORMAL LOW (ref 39.0–52.0)
Hemoglobin: 9.1 g/dL — ABNORMAL LOW (ref 13.0–17.0)
MCH: 29.2 pg (ref 26.0–34.0)
MCHC: 32.5 g/dL (ref 30.0–36.0)
MCV: 89.7 fL (ref 80.0–100.0)
Platelets: 158 10*3/uL (ref 150–400)
RBC: 3.12 MIL/uL — ABNORMAL LOW (ref 4.22–5.81)
RDW: 13.8 % (ref 11.5–15.5)
WBC: 16.3 10*3/uL — ABNORMAL HIGH (ref 4.0–10.5)
nRBC: 0.2 % (ref 0.0–0.2)

## 2020-08-18 LAB — MAGNESIUM: Magnesium: 1.8 mg/dL (ref 1.7–2.4)

## 2020-08-18 MED ORDER — SACUBITRIL-VALSARTAN 24-26 MG PO TABS
1.0000 | ORAL_TABLET | Freq: Two times a day (BID) | ORAL | Status: DC
  Administered 2020-08-18 – 2020-08-21 (×8): 1 via ORAL
  Filled 2020-08-18 (×8): qty 1

## 2020-08-18 MED ORDER — CARVEDILOL 6.25 MG PO TABS
6.2500 mg | ORAL_TABLET | Freq: Two times a day (BID) | ORAL | Status: DC
  Administered 2020-08-18 – 2020-08-19 (×3): 6.25 mg via ORAL
  Filled 2020-08-18 (×3): qty 1

## 2020-08-18 NOTE — Progress Notes (Signed)
Received order from MD Girguis per Dr. Annette Stable to remove patient hemovac back drain. Back drain removed, patient tolerated procedure well, no output in the drain prior removing. Placed gauze and tegaderm over site. No complications or bleeding noted.
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
## 2020-08-18 NOTE — Progress Notes (Signed)
Inpatient Rehabilitation-Admissions Coordinator   Met with pt bedside to notify him of approval for [facility] CIR. The insurance CM did confirm we are out of network. Reviewed pt's out of network benefits. Based on benefits, he does not want to proceed with [facility] CIR at this time. I discussed I will follow up with [facility] team to see if they can locate a program that is in network with his insurance company. Will continue to follow along at a distance in case pt changes his mind.  *Did confirm that if pt chooses another CIR program, his current authorization approval can be transferred without starting the process over.    Attending MD, please advise: PM&R MD, Dr. Posey Pronto, requested pt follow up with him in the outpatient clinic at [place], regardless of DC venue.   Raechel Ache, OTR/L  Rehab Admissions Coordinator  [phone] 08/18/2020 1:18 PM

## 2020-08-18 NOTE — TOC Progression Note (Addendum)
Transition of Care (TOC) - Progression Note    Patient Details  Name: Dean Zeringue Sr. MRN: [id number] Date of Birth: 1951-07-06  Transition of Care [facility]) CM/SW Contact  Zenon Mayo, RN Phone Number: 08/18/2020, 10:03 AM  Clinical Narrative:    NCM received vm from Pam with [facility] stating they are not in network with patient's insurance. NCM left message withj Jace with [facility] , awaiting call back to see if they are in Network.  NCM spoke with Jace at [facility] , he said to just fax over paper work to him.  NCM faxed over paper work to [employer] at [place].  NCM received call back from Jace at [facility] and he states they are in Network and can take patient, but they need to get authorization first.         Expected Discharge Plan and Services                                                 Social Determinants of Health (SDOH) Interventions    Readmission Risk Interventions Readmission Risk Prevention Plan 06/07/2019  Transportation Screening Complete  PCP or Specialist Appt within 3-5 Days Complete  HRI or [place] Complete  Social Work Consult for [place] Planning/Counseling Complete  Palliative Care Screening Not Applicable  Medication Review Press photographer) Complete  Some recent data might be hidden

## 2020-08-19 DIAGNOSIS — S064X9S Epidural hemorrhage with loss of consciousness of unspecified duration, sequela: Secondary | ICD-10-CM | POA: Diagnosis not present

## 2020-08-19 LAB — CBC WITH DIFFERENTIAL/PLATELET
Abs Immature Granulocytes: 0 10*3/uL (ref 0.00–0.07)
Basophils Absolute: 0 10*3/uL (ref 0.0–0.1)
Basophils Relative: 0 %
Eosinophils Absolute: 0 10*3/uL (ref 0.0–0.5)
Eosinophils Relative: 0 %
HCT: 30.2 % — ABNORMAL LOW (ref 39.0–52.0)
Hemoglobin: 9.6 g/dL — ABNORMAL LOW (ref 13.0–17.0)
Lymphocytes Relative: 5 %
Lymphs Abs: 1.1 10*3/uL (ref 0.7–4.0)
MCH: 29.5 pg (ref 26.0–34.0)
MCHC: 31.8 g/dL (ref 30.0–36.0)
MCV: 92.9 fL (ref 80.0–100.0)
Monocytes Absolute: 0.9 10*3/uL (ref 0.1–1.0)
Monocytes Relative: 4 %
Neutro Abs: 19.7 10*3/uL — ABNORMAL HIGH (ref 1.7–7.7)
Neutrophils Relative %: 91 %
Platelets: 171 10*3/uL (ref 150–400)
RBC: 3.25 MIL/uL — ABNORMAL LOW (ref 4.22–5.81)
RDW: 13.7 % (ref 11.5–15.5)
WBC: 21.6 10*3/uL — ABNORMAL HIGH (ref 4.0–10.5)
nRBC: 0 /100 WBC
nRBC: 0.2 % (ref 0.0–0.2)

## 2020-08-19 LAB — BASIC METABOLIC PANEL
Anion gap: 10 (ref 5–15)
BUN: 43 mg/dL — ABNORMAL HIGH (ref 8–23)
CO2: 19 mmol/L — ABNORMAL LOW (ref 22–32)
Calcium: 9.4 mg/dL (ref 8.9–10.3)
Chloride: 103 mmol/L (ref 98–111)
Creatinine, Ser: 1.19 mg/dL (ref 0.61–1.24)
GFR, Estimated: >60 mL/min (ref >60)
Glucose, Bld: 136 mg/dL — ABNORMAL HIGH (ref 70–99)
Potassium: 5.3 mmol/L — ABNORMAL HIGH (ref 3.5–5.1)
Sodium: 132 mmol/L — ABNORMAL LOW (ref 135–145)

## 2020-08-19 LAB — MAGNESIUM: Magnesium: 1.8 mg/dL (ref 1.7–2.4)

## 2020-08-19 MED ORDER — DEXAMETHASONE 2 MG PO TABS
2.0000 mg | ORAL_TABLET | Freq: Two times a day (BID) | ORAL | Status: AC
  Administered 2020-08-20 (×2): 2 mg via ORAL
  Filled 2020-08-19 (×2): qty 1

## 2020-08-19 MED ORDER — DEXAMETHASONE 4 MG PO TABS
4.0000 mg | ORAL_TABLET | Freq: Two times a day (BID) | ORAL | Status: AC
  Administered 2020-08-19: 4 mg via ORAL
  Filled 2020-08-19: qty 1

## 2020-08-19 MED ORDER — CARVEDILOL 6.25 MG PO TABS
6.2500 mg | ORAL_TABLET | Freq: Two times a day (BID) | ORAL | Status: DC
  Administered 2020-08-20 – 2020-09-02 (×28): 6.25 mg via ORAL
  Filled 2020-08-19 (×28): qty 1
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
## 2020-08-20 ENCOUNTER — Inpatient Hospital Stay (HOSPITAL_COMMUNITY): Payer: Medicare (Managed Care)

## 2020-08-20 DIAGNOSIS — S064X9S Epidural hemorrhage with loss of consciousness of unspecified duration, sequela: Secondary | ICD-10-CM | POA: Diagnosis not present

## 2020-08-20 LAB — BASIC METABOLIC PANEL
Anion gap: 11 (ref 5–15)
BUN: 45 mg/dL — ABNORMAL HIGH (ref 8–23)
CO2: 20 mmol/L — ABNORMAL LOW (ref 22–32)
Calcium: 9.9 mg/dL (ref 8.9–10.3)
Chloride: 102 mmol/L (ref 98–111)
Creatinine, Ser: 1.3 mg/dL — ABNORMAL HIGH (ref 0.61–1.24)
GFR, Estimated: 59 mL/min — ABNORMAL LOW (ref >60)
Glucose, Bld: 128 mg/dL — ABNORMAL HIGH (ref 70–99)
Potassium: 5.3 mmol/L — ABNORMAL HIGH (ref 3.5–5.1)
Sodium: 133 mmol/L — ABNORMAL LOW (ref 135–145)

## 2020-08-20 LAB — CBC WITH DIFFERENTIAL/PLATELET
Abs Immature Granulocytes: 1.68 10*3/uL — ABNORMAL HIGH (ref 0.00–0.07)
Basophils Absolute: 0.1 10*3/uL (ref 0.0–0.1)
Basophils Relative: 0 %
Eosinophils Absolute: 0 10*3/uL (ref 0.0–0.5)
Eosinophils Relative: 0 %
HCT: 33.2 % — ABNORMAL LOW (ref 39.0–52.0)
Hemoglobin: 10.4 g/dL — ABNORMAL LOW (ref 13.0–17.0)
Immature Granulocytes: 6 %
Lymphocytes Relative: 4 %
Lymphs Abs: 1 10*3/uL (ref 0.7–4.0)
MCH: 28.5 pg (ref 26.0–34.0)
MCHC: 31.3 g/dL (ref 30.0–36.0)
MCV: 91 fL (ref 80.0–100.0)
Monocytes Absolute: 1.7 10*3/uL — ABNORMAL HIGH (ref 0.1–1.0)
Monocytes Relative: 6 %
Neutro Abs: 23.4 10*3/uL — ABNORMAL HIGH (ref 1.7–7.7)
Neutrophils Relative %: 84 %
Platelets: 184 10*3/uL (ref 150–400)
RBC: 3.65 MIL/uL — ABNORMAL LOW (ref 4.22–5.81)
RDW: 14 % (ref 11.5–15.5)
WBC: 27.8 10*3/uL — ABNORMAL HIGH (ref 4.0–10.5)
nRBC: 0.1 % (ref 0.0–0.2)

## 2020-08-20 LAB — MAGNESIUM: Magnesium: 2.1 mg/dL (ref 1.7–2.4)
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
## 2020-08-20 NOTE — Progress Notes (Signed)
Inpatient Rehabilitation-Admissions Coordinator   Per [facility] team note from 11/24, pt has been accepted at [facility] but they need insurance auth first. [facility] contacted CM, Olga Coaster, to provide her with [place] number to see if [facility] can use it for approval to their facility.    Please call if questions.   Raechel Ache, OTR/L  Rehab Admissions Coordinator  [phone] 08/20/2020 9:53 AM
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
## 2020-08-20 NOTE — Progress Notes (Signed)
PT Cancellation Note  Patient Details Name: Dean Larrabee Sr. MRN: [id number] DOB: [birth year]   Cancelled Treatment:    Reason Eval/Treat Not Completed: Fatigue/lethargy limiting ability to participate;Other (comment).  Pt is tired after OT session, sore and unable to do any PT including bed ex.  Retry as time and pt allow, may be seen tomorrow if time allows.   Ramond Dial 08/20/2020, 3:24 PM   Mee Hives, PT MS Acute Rehab Dept. Number: [place]

## 2020-08-20 NOTE — TOC Progression Note (Signed)
Transition of Care (TOC) - Progression Note    Patient Details  Name: Dean [name]. MRN: [id number] Date of Birth: 10/11/1950  Transition of Care [facility]) CM/SW Contact  Sherrilyn Rist Transition of Care Supervisor Phone Number: [phone] 08/20/2020, 9:45 AM  Clinical Narrative:    Received call from [facility] with CIR; our CIR at [facility] is out of Network with the patient's insurance. Authorization number is [id number]; Claiborne Billings instructed me to contact Jace at [facility], to give him the [place] number and for him to contact [place] [facility] [place] Ext [phone];  VM left with Jace [phone]). TOC will continue to follow for progression of care.        Expected Discharge Plan and Services                                                 Social Determinants of Health (SDOH) Interventions    Readmission Risk Interventions Readmission Risk Prevention Plan 06/07/2019  Transportation Screening Complete  PCP or Specialist Appt within 3-5 Days Complete  HRI or [place] Complete  Social Work Consult for [place] Planning/Counseling Complete  Palliative Care Screening Not Applicable  Medication Review Press photographer) Complete  Some recent data might be hidden

## 2020-08-21 DIAGNOSIS — E875 Hyperkalemia: Secondary | ICD-10-CM | POA: Diagnosis not present

## 2020-08-21 DIAGNOSIS — N1832 Chronic kidney disease, stage 3b: Secondary | ICD-10-CM

## 2020-08-21 DIAGNOSIS — N179 Acute kidney failure, unspecified: Secondary | ICD-10-CM | POA: Diagnosis not present

## 2020-08-21 DIAGNOSIS — S064X9S Epidural hemorrhage with loss of consciousness of unspecified duration, sequela: Secondary | ICD-10-CM | POA: Diagnosis not present

## 2020-08-21 LAB — CBC WITH DIFFERENTIAL/PLATELET
Abs Immature Granulocytes: 2.09 10*3/uL — ABNORMAL HIGH (ref 0.00–0.07)
Basophils Absolute: 0.1 10*3/uL (ref 0.0–0.1)
Basophils Relative: 0 %
Eosinophils Absolute: 0 10*3/uL (ref 0.0–0.5)
Eosinophils Relative: 0 %
HCT: 33.1 % — ABNORMAL LOW (ref 39.0–52.0)
Hemoglobin: 10.3 g/dL — ABNORMAL LOW (ref 13.0–17.0)
Immature Granulocytes: 8 %
Lymphocytes Relative: 4 %
Lymphs Abs: 1 10*3/uL (ref 0.7–4.0)
MCH: 28.5 pg (ref 26.0–34.0)
MCHC: 31.1 g/dL (ref 30.0–36.0)
MCV: 91.7 fL (ref 80.0–100.0)
Monocytes Absolute: 1.4 10*3/uL — ABNORMAL HIGH (ref 0.1–1.0)
Monocytes Relative: 5 %
Neutro Abs: 22.7 10*3/uL — ABNORMAL HIGH (ref 1.7–7.7)
Neutrophils Relative %: 83 %
Platelets: 151 10*3/uL (ref 150–400)
RBC: 3.61 MIL/uL — ABNORMAL LOW (ref 4.22–5.81)
RDW: 14 % (ref 11.5–15.5)
WBC: 27.3 10*3/uL — ABNORMAL HIGH (ref 4.0–10.5)
nRBC: 0.1 % (ref 0.0–0.2)

## 2020-08-21 LAB — MAGNESIUM: Magnesium: 2 mg/dL (ref 1.7–2.4)

## 2020-08-21 LAB — BASIC METABOLIC PANEL
Anion gap: 9 (ref 5–15)
BUN: 45 mg/dL — ABNORMAL HIGH (ref 8–23)
CO2: 19 mmol/L — ABNORMAL LOW (ref 22–32)
Calcium: 9.1 mg/dL (ref 8.9–10.3)
Chloride: 103 mmol/L (ref 98–111)
Creatinine, Ser: 1.27 mg/dL — ABNORMAL HIGH (ref 0.61–1.24)
GFR, Estimated: >60 mL/min (ref >60)
Glucose, Bld: 147 mg/dL — ABNORMAL HIGH (ref 70–99)
Potassium: 5.7 mmol/L — ABNORMAL HIGH (ref 3.5–5.1)
Sodium: 131 mmol/L — ABNORMAL LOW (ref 135–145)

## 2020-08-21 MED ORDER — INSULIN ASPART 100 UNIT/ML ~~LOC~~ SOLN
10.0000 [IU] | Freq: Once | SUBCUTANEOUS | Status: AC
  Administered 2020-08-21: 10 [IU] via SUBCUTANEOUS

## 2020-08-21 MED ORDER — SODIUM ZIRCONIUM CYCLOSILICATE 10 G PO PACK
10.0000 g | PACK | Freq: Every day | ORAL | Status: DC
  Administered 2020-08-21: 10 g via ORAL
  Filled 2020-08-21: qty 1

## 2020-08-21 MED ORDER — APIXABAN 5 MG PO TABS
5.0000 mg | ORAL_TABLET | Freq: Two times a day (BID) | ORAL | Status: DC
  Administered 2020-08-21 – 2020-08-25 (×10): 5 mg via ORAL
  Filled 2020-08-21 (×10): qty 1

## 2020-08-21 MED ORDER — DEXTROSE 50 % IV SOLN
1.0000 | Freq: Once | INTRAVENOUS | Status: AC
  Administered 2020-08-21: 50 mL via INTRAVENOUS
  Filled 2020-08-21: qty 50
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
## 2020-08-22 DIAGNOSIS — S064X9S Epidural hemorrhage with loss of consciousness of unspecified duration, sequela: Secondary | ICD-10-CM | POA: Diagnosis not present

## 2020-08-22 DIAGNOSIS — N179 Acute kidney failure, unspecified: Secondary | ICD-10-CM | POA: Diagnosis not present

## 2020-08-22 DIAGNOSIS — E875 Hyperkalemia: Secondary | ICD-10-CM | POA: Diagnosis not present

## 2020-08-22 DIAGNOSIS — I5042 Chronic combined systolic (congestive) and diastolic (congestive) heart failure: Secondary | ICD-10-CM | POA: Diagnosis not present

## 2020-08-22 LAB — CBC WITH DIFFERENTIAL/PLATELET
Abs Immature Granulocytes: 2.37 10*3/uL — ABNORMAL HIGH (ref 0.00–0.07)
Basophils Absolute: 0.1 10*3/uL (ref 0.0–0.1)
Basophils Relative: 0 %
Eosinophils Absolute: 0.2 10*3/uL (ref 0.0–0.5)
Eosinophils Relative: 1 %
HCT: 30.2 % — ABNORMAL LOW (ref 39.0–52.0)
Hemoglobin: 9.9 g/dL — ABNORMAL LOW (ref 13.0–17.0)
Immature Granulocytes: 8 %
Lymphocytes Relative: 7 %
Lymphs Abs: 2 10*3/uL (ref 0.7–4.0)
MCH: 29.2 pg (ref 26.0–34.0)
MCHC: 32.8 g/dL (ref 30.0–36.0)
MCV: 89.1 fL (ref 80.0–100.0)
Monocytes Absolute: 2.1 10*3/uL — ABNORMAL HIGH (ref 0.1–1.0)
Monocytes Relative: 7 %
Neutro Abs: 22.1 10*3/uL — ABNORMAL HIGH (ref 1.7–7.7)
Neutrophils Relative %: 77 %
Platelets: 150 10*3/uL (ref 150–400)
RBC: 3.39 MIL/uL — ABNORMAL LOW (ref 4.22–5.81)
RDW: 14.1 % (ref 11.5–15.5)
WBC: 28.9 10*3/uL — ABNORMAL HIGH (ref 4.0–10.5)
nRBC: 0 % (ref 0.0–0.2)

## 2020-08-22 LAB — BASIC METABOLIC PANEL
Anion gap: 9 (ref 5–15)
BUN: 58 mg/dL — ABNORMAL HIGH (ref 8–23)
CO2: 21 mmol/L — ABNORMAL LOW (ref 22–32)
Calcium: 9 mg/dL (ref 8.9–10.3)
Chloride: 101 mmol/L (ref 98–111)
Creatinine, Ser: 1.5 mg/dL — ABNORMAL HIGH (ref 0.61–1.24)
GFR, Estimated: 50 mL/min — ABNORMAL LOW (ref >60)
Glucose, Bld: 106 mg/dL — ABNORMAL HIGH (ref 70–99)
Potassium: 5.3 mmol/L — ABNORMAL HIGH (ref 3.5–5.1)
Sodium: 131 mmol/L — ABNORMAL LOW (ref 135–145)

## 2020-08-22 LAB — MAGNESIUM: Magnesium: 1.9 mg/dL (ref 1.7–2.4)

## 2020-08-22 MED ORDER — ISOSORB DINITRATE-HYDRALAZINE 20-37.5 MG PO TABS
0.5000 | ORAL_TABLET | Freq: Three times a day (TID) | ORAL | Status: DC
  Administered 2020-08-22 – 2020-09-02 (×36): 0.5 via ORAL
  Filled 2020-08-22 (×36): qty 1

## 2020-08-22 NOTE — Assessment & Plan Note (Addendum)
-   echo 04/2020: EF 30-35%, Gr 1 DD - has been on/off Entresto in the past and follows closely with cardiology - given recurrent hyperkalemia again even after renal function improved, will have to once again stop Entresto to see if K remains stable now - substitute Bidil for mortality benefit, especially given HFrEF and AA race; will uptitrate as BP allows  - BP soft; may need to decrease coreg if remains on low end or symptomatic
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
## 2020-08-23 DIAGNOSIS — N1832 Chronic kidney disease, stage 3b: Secondary | ICD-10-CM | POA: Diagnosis not present

## 2020-08-23 DIAGNOSIS — N179 Acute kidney failure, unspecified: Secondary | ICD-10-CM | POA: Diagnosis not present

## 2020-08-23 DIAGNOSIS — S064X9S Epidural hemorrhage with loss of consciousness of unspecified duration, sequela: Secondary | ICD-10-CM | POA: Diagnosis not present

## 2020-08-23 LAB — CBC WITH DIFFERENTIAL/PLATELET
Abs Immature Granulocytes: 1.67 10*3/uL — ABNORMAL HIGH (ref 0.00–0.07)
Basophils Absolute: 0.1 10*3/uL (ref 0.0–0.1)
Basophils Relative: 0 %
Eosinophils Absolute: 0.3 10*3/uL (ref 0.0–0.5)
Eosinophils Relative: 1 %
HCT: 28 % — ABNORMAL LOW (ref 39.0–52.0)
Hemoglobin: 9.2 g/dL — ABNORMAL LOW (ref 13.0–17.0)
Immature Granulocytes: 8 %
Lymphocytes Relative: 8 %
Lymphs Abs: 1.8 10*3/uL (ref 0.7–4.0)
MCH: 29 pg (ref 26.0–34.0)
MCHC: 32.9 g/dL (ref 30.0–36.0)
MCV: 88.3 fL (ref 80.0–100.0)
Monocytes Absolute: 1.8 10*3/uL — ABNORMAL HIGH (ref 0.1–1.0)
Monocytes Relative: 8 %
Neutro Abs: 16.6 10*3/uL — ABNORMAL HIGH (ref 1.7–7.7)
Neutrophils Relative %: 75 %
Platelets: 131 10*3/uL — ABNORMAL LOW (ref 150–400)
RBC: 3.17 MIL/uL — ABNORMAL LOW (ref 4.22–5.81)
RDW: 14 % (ref 11.5–15.5)
WBC: 22.3 10*3/uL — ABNORMAL HIGH (ref 4.0–10.5)
nRBC: 0 % (ref 0.0–0.2)

## 2020-08-23 LAB — BASIC METABOLIC PANEL
Anion gap: 10 (ref 5–15)
BUN: 64 mg/dL — ABNORMAL HIGH (ref 8–23)
CO2: 19 mmol/L — ABNORMAL LOW (ref 22–32)
Calcium: 8.8 mg/dL — ABNORMAL LOW (ref 8.9–10.3)
Chloride: 100 mmol/L (ref 98–111)
Creatinine, Ser: 1.62 mg/dL — ABNORMAL HIGH (ref 0.61–1.24)
GFR, Estimated: 45 mL/min — ABNORMAL LOW (ref >60)
Glucose, Bld: 105 mg/dL — ABNORMAL HIGH (ref 70–99)
Potassium: 5.1 mmol/L (ref 3.5–5.1)
Sodium: 129 mmol/L — ABNORMAL LOW (ref 135–145)

## 2020-08-23 LAB — MAGNESIUM: Magnesium: 2.1 mg/dL (ref 1.7–2.4)

## 2020-08-23 MED ORDER — SODIUM CHLORIDE 0.9 % IV SOLN: INTRAVENOUS | Status: AC
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
## 2020-08-23 NOTE — TOC Progression Note (Signed)
Transition of Care (TOC) - Progression Note    Patient Details  Name: Dean [name]. MRN: [id number] Date of Birth: 01/22/1951  Transition of [facility]) CM/SW Contact  Zenon Mayo, RN Phone Number: 08/23/2020, 11:36 AM  Clinical Narrative:    NCM contacted Jace with [facility] CIR, Jace state they have not heard back from the insurance company yet for authorization, but they will be checking on it today. If get authorization they will have bed tomorrow.        Expected Discharge Plan and Services                                                 Social Determinants of Health (SDOH) Interventions    Readmission Risk Interventions Readmission Risk Prevention Plan 06/07/2019  Transportation Screening Complete  PCP or Specialist Appt within 3-5 Days Complete  HRI or [place] Complete  Social Work Consult for [place] Planning/Counseling Complete  Palliative Care Screening Not Applicable  Medication Review Press photographer) Complete  Some recent data might be hidden

## 2020-08-23 NOTE — Care Management Important Message (Signed)
Important Message  Patient Details  Name: Dean Fells Sr. MRN: [id number] Date of Birth: 1951-01-11   Medicare Important Message Given:  Yes     Dean [name] 08/23/2020, 8:52 AM

## 2020-08-24 DIAGNOSIS — S064X0D Epidural hemorrhage without loss of consciousness, subsequent encounter: Secondary | ICD-10-CM

## 2020-08-24 LAB — CBC WITH DIFFERENTIAL/PLATELET
Abs Immature Granulocytes: 0.92 10*3/uL — ABNORMAL HIGH (ref 0.00–0.07)
Basophils Absolute: 0 10*3/uL (ref 0.0–0.1)
Basophils Relative: 0 %
Eosinophils Absolute: 0.3 10*3/uL (ref 0.0–0.5)
Eosinophils Relative: 1 %
HCT: 27.3 % — ABNORMAL LOW (ref 39.0–52.0)
Hemoglobin: 8.7 g/dL — ABNORMAL LOW (ref 13.0–17.0)
Immature Granulocytes: 4 %
Lymphocytes Relative: 7 %
Lymphs Abs: 1.4 10*3/uL (ref 0.7–4.0)
MCH: 28.8 pg (ref 26.0–34.0)
MCHC: 31.9 g/dL (ref 30.0–36.0)
MCV: 90.4 fL (ref 80.0–100.0)
Monocytes Absolute: 1.4 10*3/uL — ABNORMAL HIGH (ref 0.1–1.0)
Monocytes Relative: 7 %
Neutro Abs: 16.8 10*3/uL — ABNORMAL HIGH (ref 1.7–7.7)
Neutrophils Relative %: 81 %
Platelets: 128 10*3/uL — ABNORMAL LOW (ref 150–400)
RBC: 3.02 MIL/uL — ABNORMAL LOW (ref 4.22–5.81)
RDW: 14.4 % (ref 11.5–15.5)
WBC: 20.7 10*3/uL — ABNORMAL HIGH (ref 4.0–10.5)
nRBC: 0 % (ref 0.0–0.2)

## 2020-08-24 LAB — BASIC METABOLIC PANEL
Anion gap: 5 (ref 5–15)
BUN: 40 mg/dL — ABNORMAL HIGH (ref 8–23)
CO2: 20 mmol/L — ABNORMAL LOW (ref 22–32)
Calcium: 8.7 mg/dL — ABNORMAL LOW (ref 8.9–10.3)
Chloride: 103 mmol/L (ref 98–111)
Creatinine, Ser: 1.31 mg/dL — ABNORMAL HIGH (ref 0.61–1.24)
GFR, Estimated: 59 mL/min — ABNORMAL LOW (ref >60)
Glucose, Bld: 88 mg/dL (ref 70–99)
Potassium: 5.1 mmol/L (ref 3.5–5.1)
Sodium: 128 mmol/L — ABNORMAL LOW (ref 135–145)

## 2020-08-24 LAB — MAGNESIUM: Magnesium: 1.9 mg/dL (ref 1.7–2.4)
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
## 2020-08-25 LAB — CBC WITH DIFFERENTIAL/PLATELET
Abs Immature Granulocytes: 0.82 10*3/uL — ABNORMAL HIGH (ref 0.00–0.07)
Basophils Absolute: 0 10*3/uL (ref 0.0–0.1)
Basophils Relative: 0 %
Eosinophils Absolute: 0.3 10*3/uL (ref 0.0–0.5)
Eosinophils Relative: 2 %
HCT: 25.7 % — ABNORMAL LOW (ref 39.0–52.0)
Hemoglobin: 8.5 g/dL — ABNORMAL LOW (ref 13.0–17.0)
Immature Granulocytes: 4 %
Lymphocytes Relative: 7 %
Lymphs Abs: 1.2 10*3/uL (ref 0.7–4.0)
MCH: 29.9 pg (ref 26.0–34.0)
MCHC: 33.1 g/dL (ref 30.0–36.0)
MCV: 90.5 fL (ref 80.0–100.0)
Monocytes Absolute: 1.5 10*3/uL — ABNORMAL HIGH (ref 0.1–1.0)
Monocytes Relative: 8 %
Neutro Abs: 15.1 10*3/uL — ABNORMAL HIGH (ref 1.7–7.7)
Neutrophils Relative %: 79 %
Platelets: 111 10*3/uL — ABNORMAL LOW (ref 150–400)
RBC: 2.84 MIL/uL — ABNORMAL LOW (ref 4.22–5.81)
RDW: 14.6 % (ref 11.5–15.5)
WBC: 18.9 10*3/uL — ABNORMAL HIGH (ref 4.0–10.5)
nRBC: 0 % (ref 0.0–0.2)

## 2020-08-25 LAB — BASIC METABOLIC PANEL
Anion gap: 7 (ref 5–15)
BUN: 32 mg/dL — ABNORMAL HIGH (ref 8–23)
CO2: 20 mmol/L — ABNORMAL LOW (ref 22–32)
Calcium: 9 mg/dL (ref 8.9–10.3)
Chloride: 107 mmol/L (ref 98–111)
Creatinine, Ser: 1.23 mg/dL (ref 0.61–1.24)
GFR, Estimated: >60 mL/min (ref >60)
Glucose, Bld: 106 mg/dL — ABNORMAL HIGH (ref 70–99)
Potassium: 5.1 mmol/L (ref 3.5–5.1)
Sodium: 134 mmol/L — ABNORMAL LOW (ref 135–145)

## 2020-08-25 LAB — MAGNESIUM: Magnesium: 1.9 mg/dL (ref 1.7–2.4)

## 2020-08-25 LAB — PHOSPHORUS: Phosphorus: 2.5 mg/dL (ref 2.5–4.6)

## 2020-08-25 NOTE — TOC Progression Note (Signed)
Transition of Care (TOC) - Progression Note    Patient Details  Name: Dean Bahl Sr. MRN: [id number] Date of Birth: 11/17/1950  [facility]) CM/SW Contact  Zenon Mayo, RN Phone Number: 08/25/2020, 10:07 AM  Clinical Narrative:    Per Duffy Rhody at [facility] they are expediting his approval today, so it is a good chance he will get auth today for CIR.        Expected Discharge Plan and Services                                                 Social Determinants of Health (SDOH) Interventions    Readmission Risk Interventions Readmission Risk Prevention Plan 06/07/2019  Transportation Screening Complete  PCP or Specialist Appt within 3-5 Days Complete  HRI or [place] Complete  Social Work Consult for [place] Planning/Counseling Complete  Palliative Care Screening Not Applicable  Medication Review [employer] photographer) Complete  Some recent data might be hidden
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
## 2020-08-26 LAB — CBC WITH DIFFERENTIAL/PLATELET
Abs Immature Granulocytes: 0.42 10*3/uL — ABNORMAL HIGH (ref 0.00–0.07)
Basophils Absolute: 0 10*3/uL (ref 0.0–0.1)
Basophils Relative: 0 %
Eosinophils Absolute: 0.3 10*3/uL (ref 0.0–0.5)
Eosinophils Relative: 2 %
HCT: 24.7 % — ABNORMAL LOW (ref 39.0–52.0)
Hemoglobin: 7.7 g/dL — ABNORMAL LOW (ref 13.0–17.0)
Immature Granulocytes: 3 %
Lymphocytes Relative: 9 %
Lymphs Abs: 1.4 10*3/uL (ref 0.7–4.0)
MCH: 28.6 pg (ref 26.0–34.0)
MCHC: 31.2 g/dL (ref 30.0–36.0)
MCV: 91.8 fL (ref 80.0–100.0)
Monocytes Absolute: 1.1 10*3/uL — ABNORMAL HIGH (ref 0.1–1.0)
Monocytes Relative: 7 %
Neutro Abs: 13.1 10*3/uL — ABNORMAL HIGH (ref 1.7–7.7)
Neutrophils Relative %: 79 %
Platelets: 108 10*3/uL — ABNORMAL LOW (ref 150–400)
RBC: 2.69 MIL/uL — ABNORMAL LOW (ref 4.22–5.81)
RDW: 14.5 % (ref 11.5–15.5)
WBC: 16.3 10*3/uL — ABNORMAL HIGH (ref 4.0–10.5)
nRBC: 0 % (ref 0.0–0.2)

## 2020-08-26 LAB — MAGNESIUM: Magnesium: 1.7 mg/dL (ref 1.7–2.4)

## 2020-08-26 LAB — RETICULOCYTES
Immature Retic Fract: 10.4 % (ref 2.3–15.9)
RBC.: 2.71 MIL/uL — ABNORMAL LOW (ref 4.22–5.81)
Retic Count, Absolute: 46.1 10*3/uL (ref 19.0–186.0)
Retic Ct Pct: 1.7 % (ref 0.4–3.1)

## 2020-08-26 LAB — BASIC METABOLIC PANEL
Anion gap: 10 (ref 5–15)
BUN: 23 mg/dL (ref 8–23)
CO2: 20 mmol/L — ABNORMAL LOW (ref 22–32)
Calcium: 9.3 mg/dL (ref 8.9–10.3)
Chloride: 106 mmol/L (ref 98–111)
Creatinine, Ser: 1.15 mg/dL (ref 0.61–1.24)
GFR, Estimated: >60 mL/min (ref >60)
Glucose, Bld: 119 mg/dL — ABNORMAL HIGH (ref 70–99)
Potassium: 4.3 mmol/L (ref 3.5–5.1)
Sodium: 136 mmol/L (ref 135–145)

## 2020-08-26 LAB — FOLATE: Folate: 12.6 ng/mL (ref >5.9)

## 2020-08-26 LAB — IRON AND TIBC
Iron: 28 ug/dL — ABNORMAL LOW (ref 45–182)
Saturation Ratios: 13 % — ABNORMAL LOW (ref 17.9–39.5)
TIBC: 224 ug/dL — ABNORMAL LOW (ref 250–450)
UIBC: 196 ug/dL

## 2020-08-26 LAB — VITAMIN B12: Vitamin B-12: 523 pg/mL (ref 180–914)

## 2020-08-26 LAB — FERRITIN: Ferritin: 695 ng/mL — ABNORMAL HIGH (ref 24–336)

## 2020-08-26 MED ORDER — GERHARDT'S BUTT CREAM
TOPICAL_CREAM | CUTANEOUS | Status: DC | PRN
  Filled 2020-08-26: qty 1
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
## 2020-08-26 NOTE — TOC Progression Note (Signed)
Transition of Care (TOC) - Progression Note    Patient Details  Name: Dean Hartsell Sr. MRN: [id number] Date of Birth: 05-23-1951  Transition of Care [facility]) CM/SW Contact  Zenon Mayo, RN Phone Number: 08/26/2020, 9:04 AM  Clinical Narrative:    NCM just received call from [facility] with [facility] , stating insurance company denied the inpatient rehab and a peer to peer will need to be done at 1 [phone] before 5 pm for MD to tell why he thinks patient need inpatient rehab.  NCM notified MD of this information.        Expected Discharge Plan and Services                                                 Social Determinants of Health (SDOH) Interventions    Readmission Risk Interventions Readmission Risk Prevention Plan 06/07/2019  Transportation Screening Complete  PCP or Specialist Appt within 3-5 Days Complete  HRI or [place] Complete  Social Work Consult for [place] Planning/Counseling Complete  Palliative Care Screening Not Applicable  Medication Review Press photographer) Complete  Some recent data might be hidden
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
## 2020-08-27 LAB — PREPARE RBC (CROSSMATCH)

## 2020-08-27 LAB — BASIC METABOLIC PANEL
Anion gap: 9 (ref 5–15)
BUN: 18 mg/dL (ref 8–23)
CO2: 21 mmol/L — ABNORMAL LOW (ref 22–32)
Calcium: 9.1 mg/dL (ref 8.9–10.3)
Chloride: 104 mmol/L (ref 98–111)
Creatinine, Ser: 1.15 mg/dL (ref 0.61–1.24)
GFR, Estimated: >60 mL/min (ref >60)
Glucose, Bld: 92 mg/dL (ref 70–99)
Potassium: 4.5 mmol/L (ref 3.5–5.1)
Sodium: 134 mmol/L — ABNORMAL LOW (ref 135–145)

## 2020-08-27 LAB — CBC WITH DIFFERENTIAL/PLATELET
Abs Immature Granulocytes: 0.32 10*3/uL — ABNORMAL HIGH (ref 0.00–0.07)
Basophils Absolute: 0 10*3/uL (ref 0.0–0.1)
Basophils Relative: 0 %
Eosinophils Absolute: 0.3 10*3/uL (ref 0.0–0.5)
Eosinophils Relative: 2 %
HCT: 23.1 % — ABNORMAL LOW (ref 39.0–52.0)
Hemoglobin: 7.4 g/dL — ABNORMAL LOW (ref 13.0–17.0)
Immature Granulocytes: 2 %
Lymphocytes Relative: 9 %
Lymphs Abs: 1.2 10*3/uL (ref 0.7–4.0)
MCH: 29.2 pg (ref 26.0–34.0)
MCHC: 32 g/dL (ref 30.0–36.0)
MCV: 91.3 fL (ref 80.0–100.0)
Monocytes Absolute: 1.1 10*3/uL — ABNORMAL HIGH (ref 0.1–1.0)
Monocytes Relative: 8 %
Neutro Abs: 10.9 10*3/uL — ABNORMAL HIGH (ref 1.7–7.7)
Neutrophils Relative %: 79 %
Platelets: 106 10*3/uL — ABNORMAL LOW (ref 150–400)
RBC: 2.53 MIL/uL — ABNORMAL LOW (ref 4.22–5.81)
RDW: 14.6 % (ref 11.5–15.5)
WBC: 13.9 10*3/uL — ABNORMAL HIGH (ref 4.0–10.5)
nRBC: 0 % (ref 0.0–0.2)

## 2020-08-27 LAB — MAGNESIUM: Magnesium: 1.6 mg/dL — ABNORMAL LOW (ref 1.7–2.4)

## 2020-08-27 MED ORDER — SODIUM CHLORIDE 0.9% IV SOLUTION: Freq: Once | INTRAVENOUS | Status: AC

## 2020-08-27 MED ORDER — MAGNESIUM SULFATE 2 GM/50ML IV SOLN
2.0000 g | Freq: Once | INTRAVENOUS | Status: AC
  Administered 2020-08-27: 2 g via INTRAVENOUS
  Filled 2020-08-27: qty 50

## 2020-08-27 NOTE — Progress Notes (Signed)
PT Cancellation Note  Patient Details Name: Dean Borum Sr. MRN: [id number] DOB: Mar 20, 1951   Cancelled Treatment:    Reason Eval/Treat Not Completed: Other (comment).  Refused PT, reporting he was leaving soon today.  Nursing and PT talked with him but he is declining visit.  Retry as time and pt allow.   Ramond Dial 08/27/2020, 4:34 PM   Mee Hives, PT MS Acute Rehab Dept. Number: [place]
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
## 2020-08-27 NOTE — Care Management Important Message (Signed)
Important Message  Patient Details  Name: Dean Gaunce Sr. MRN: [id number] Date of Birth: [birth year]   Medicare Important Message Given:  Yes     [name] 08/27/2020, 2:54 PM

## 2020-08-27 NOTE — TOC Transition Note (Addendum)
Transition of Care [facility]) - CM/SW Discharge Note   Patient Details  Name: Dean Boyadjian Sr. MRN: [id number] Date of Birth: 1950/10/14  Transition of Care [facility] CM/SW Contact:  Zenon Mayo, RN Phone Number: 08/27/2020, 10:09 AM   Clinical Narrative:    NCM spoke with patient on 12/2, he states he would like [facility] for [facility] services which he had recently, will need HHRN, [place], HHOT, HHAIDE.  MD did not want to do peer to peer. NCM informed Ramond Marrow that patient may be dc today, will need HH orders. NCM spoke with daughter Evert Kohl she states they will take care of him at home, He is set up with [facility] for HHRN,HHPT, [place], [place].    Final next level of care: [place] Barriers to Discharge: Continued Medical Work up   Patient Goals and CMS Choice Patient states their goals for this hospitalization and ongoing recovery are:: home with [facility] CMS Medicare.gov Compare Post Acute Care list provided to:: Patient Choice offered to / list presented to : Patient  Discharge Placement                       Discharge Plan and Services                  DME Agency: NA       HH Arranged: RN, PT, OT, Disease Management, AIDE [place] Agency: [place] ([facility]) Date HH Agency Contacted: 08/27/20 Time [place]: 69 Representative spoke with at [place]: [place] ([place]) Interventions     Readmission Risk Interventions Readmission Risk Prevention Plan 06/07/2019  Transportation Screening Complete  PCP or Specialist Appt within 3-5 Days Complete  HRI or [place] Complete  Social Work Consult for [place] Planning/Counseling Complete  Palliative Care Screening Not Applicable  Medication Review Press photographer) Complete  Some recent data might be hidden

## 2020-08-27 NOTE — Plan of Care (Signed)
  Problem: Coping: Goal: Level of anxiety will decrease Outcome: Progressing   Problem: Pain Managment: Goal: General experience of comfort will improve Outcome: Progressing   Problem: Safety: Goal: Ability to remain free from injury will improve Outcome: Progressing   

## 2020-08-28 LAB — CBC WITH DIFFERENTIAL/PLATELET
Abs Immature Granulocytes: 0.15 10*3/uL — ABNORMAL HIGH (ref 0.00–0.07)
Basophils Absolute: 0 10*3/uL (ref 0.0–0.1)
Basophils Relative: 0 %
Eosinophils Absolute: 0.2 10*3/uL (ref 0.0–0.5)
Eosinophils Relative: 2 %
HCT: 25.5 % — ABNORMAL LOW (ref 39.0–52.0)
Hemoglobin: 8.5 g/dL — ABNORMAL LOW (ref 13.0–17.0)
Immature Granulocytes: 1 %
Lymphocytes Relative: 9 %
Lymphs Abs: 1.1 10*3/uL (ref 0.7–4.0)
MCH: 29.5 pg (ref 26.0–34.0)
MCHC: 33.3 g/dL (ref 30.0–36.0)
MCV: 88.5 fL (ref 80.0–100.0)
Monocytes Absolute: 0.8 10*3/uL (ref 0.1–1.0)
Monocytes Relative: 7 %
Neutro Abs: 9.8 10*3/uL — ABNORMAL HIGH (ref 1.7–7.7)
Neutrophils Relative %: 81 %
Platelets: 78 10*3/uL — ABNORMAL LOW (ref 150–400)
RBC: 2.88 MIL/uL — ABNORMAL LOW (ref 4.22–5.81)
RDW: 14.5 % (ref 11.5–15.5)
WBC: 12 10*3/uL — ABNORMAL HIGH (ref 4.0–10.5)
nRBC: 0 % (ref 0.0–0.2)

## 2020-08-28 LAB — TYPE AND SCREEN
ABO/RH(D): O POS
Antibody Screen: NEGATIVE
Unit division: 0

## 2020-08-28 LAB — BPAM RBC
Blood Product Expiration Date: 202201042359
ISSUE DATE / TIME: 202112031531
Unit Type and Rh: 5100

## 2020-08-28 LAB — BASIC METABOLIC PANEL
Anion gap: 11 (ref 5–15)
BUN: 17 mg/dL (ref 8–23)
CO2: 20 mmol/L — ABNORMAL LOW (ref 22–32)
Calcium: 9.4 mg/dL (ref 8.9–10.3)
Chloride: 104 mmol/L (ref 98–111)
Creatinine, Ser: 1.16 mg/dL (ref 0.61–1.24)
GFR, Estimated: >60 mL/min (ref >60)
Glucose, Bld: 116 mg/dL — ABNORMAL HIGH (ref 70–99)
Potassium: 4.9 mmol/L (ref 3.5–5.1)
Sodium: 135 mmol/L (ref 135–145)

## 2020-08-28 LAB — MAGNESIUM: Magnesium: 1.7 mg/dL (ref 1.7–2.4)

## 2020-08-28 MED ORDER — CARVEDILOL 6.25 MG PO TABS: 6.2500 mg | ORAL_TABLET | Freq: Two times a day (BID) | ORAL | 0 refills | Status: DC

## 2020-08-28 MED ORDER — FUROSEMIDE 40 MG PO TABS
40.0000 mg | ORAL_TABLET | Freq: Every day | ORAL | 0 refills | Status: DC | PRN
Start: 2020-08-28 — End: 2020-09-02

## 2020-08-28 MED ORDER — ISOSORB DINITRATE-HYDRALAZINE 20-37.5 MG PO TABS: 0.5000 | ORAL_TABLET | Freq: Three times a day (TID) | ORAL | 0 refills | Status: AC

## 2020-08-28 MED ORDER — DOCUSATE SODIUM 100 MG PO CAPS
100.0000 mg | ORAL_CAPSULE | Freq: Two times a day (BID) | ORAL | 0 refills | Status: DC
Start: 2020-08-28 — End: 2022-01-31

## 2020-08-28 MED ORDER — CYCLOBENZAPRINE HCL 10 MG PO TABS
10.0000 mg | ORAL_TABLET | Freq: Three times a day (TID) | ORAL | 0 refills | Status: DC | PRN
Start: 2020-08-28 — End: 2020-09-02
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
## 2020-08-28 NOTE — Discharge Instructions (Signed)
Epidural Hemorrhage  An epidural hemorrhage is bleeding between the inside of the skull and the outer membrane that covers the brain (dura mater). As the bleeding increases, more pressure is put on the brain. This can cause the affected part of the brain to stop working and can lead to disability and death. An epidural hemorrhage is a type of traumatic brain injury (TBI) that is a medical emergency. This condition is also called epidural hematoma. What are the causes? This condition is caused by bleeding from a broken (ruptured) blood vessel. In most cases, a blood vessel ruptures and bleeds due to an injury to the head. Head injuries can happen as a result of car accidents, falls, assaults, or sports. Less commonly, an epidural hemorrhage can happen without head trauma. In these cases, bleeding may be caused by infection, a tumor, or a bleeding disorder. What increases the risk? You are more likely to develop this condition if you:  Participate in sports such as boxing, football, soccer, hockey, auto or bike racing, downhill skiing, and horseback riding.  Take risks while driving, such as speeding.  Are at risk for falls.  Take blood thinners.  Have a bleeding disorder.  Abuse drugs or alcohol. What are the signs or symptoms? Symptoms of this condition may include:  A severe headache or a headache that steadily gets worse.  Nausea or vomiting.  Sensitivity to light.  Dizziness.  Drowsiness.  Loss of consciousness.  Confusion.  Changes in speech or difficulty speaking.  Not being able to move an arm or a leg on one side of the body.  Seizures. Symptoms may develop immediately or several hours after a head injury. How is this diagnosed? This condition may be diagnosed based on your symptoms, your medical history, and a physical exam. You may have tests, including:  Blood tests.  Imaging tests, such as: ? CT scan. ? MRI. How is this treated? This condition is a  medical emergency that must be treated in a hospital immediately. Treatment depends on the cause, severity, and duration of your symptoms. Treatment may include:  Medicines that: ? Lower blood pressure (antihypertensives). ? Relieve pain (analgesics). ? Relieve nausea and vomiting. ? Control seizures. ? Reduce swelling in the brain. ? Control bleeding.  Monitoring your blood pressure.  Assistance with breathing (ventilation).  Getting donated blood products through an IV (transfusion). You may receive cells that help your blood clot.  Placing a tube (shunt) in the brain to relieve pressure.  Performing surgery. This may be needed to stop bleeding, remove blood, or reduce pressure on the brain. Follow these instructions at home: Activity  Avoid situations where you could injure your head again, such as in competitive sports, downhill snow sports, and horseback riding. Do not do these activities until your health care provider approves.  If you play a contact sport and you experience a head injury, follow advice from your health care provider about when you can return to the sport.  Rest. This helps the brain heal. Make sure you: ? Get plenty of sleep. Avoid staying up late at night. ? Keep a consistent sleep schedule. Try to go to sleep and wake up at about the same time every day.  Try to avoid activities that cause physical or mental stress. Return to work or school as told by your health care provider.  Do not drive, ride a bike, or use heavy machinery until your health care provider approves.  Do not lift anything that is heavier  than 5 lb (2.3 kg), or the limit that you are told, until your health care provider says that it is safe. Alcohol use  Do not drink alcohol if: ? Your health care provider tells you not to drink. ? You are pregnant, may be pregnant, or are planning to become pregnant.  If you drink alcohol, limit how much you use to: ? 0-1 drink a day for  women. ? 0-2 drinks a day for men.  Be aware of how much alcohol is in your drink. In the [place] one drink equals one 12 oz bottle of beer (355 mL), one 5 oz glass of wine (148 mL), or one 1 oz glass of hard liquor (44 mL). General instructions  Monitor your symptoms and ask people around you to do the same.  Take over-the-counter and prescription medicines only as told by your health care provider. Do not take blood thinners or NSAIDs unless your health care provider approves. These include warfarin, aspirin, ibuprofen, and naproxen.  Talk with your health care provider about what to expect during your recovery.  Work with your rehabilitation specialists as told by your health care provider. Recovery from brain injuries varies.  Keep all follow-up visits as told by your health care provider. This is important. How is this prevented?  Wear protective gear, such as a helmet, when participating in activities such as biking or contact sports.  Always wear a seat belt when you are in a motor vehicle.  Keep your home environment safe and clear of clutter to reduce the risk of falling. Where to find more information Brain Injury Association of America: www.biausa.org Contact a health care provider if:  You develop any of the following symptoms after your injury: ? Headaches that keep coming back. ? Dizziness or balance problems. ? Nausea. ? Vision problems. ? Increased sensitivity to noise or light. ? Depression or mood swings. ? Anxiety or irritability. ? Memory problems. ? Difficulty concentrating or paying attention. ? Sleep problems. ? Feeling tired all the time. Get help right away if:  You are taking blood thinners and you fall or experience minor trauma to the head.  You have a bleeding disorder and you fall or experience minor trauma to the head.  You develop any of the following symptoms after a head injury: ? A severe headache or a headache that steadily gets  worse. ? Nausea or vomiting. ? Changes in speech or difficulty speaking. ? Seizure. ? Drowsiness or a decrease in alertness. ? Dizziness. ? Loss of consciousness. ? Confusion. ? Inability to move your arm or your leg on one side of your body. These symptoms may represent a serious problem that is an emergency. Do not wait to see if the symptoms will go away. Get medical help right away. Call your local emergency services (911 in the [place] Do not drive yourself to the hospital. Summary  An epidural hemorrhage is bleeding between the inside of the skull and the outer membrane that covers the brain (dura mater). This injury often happens due to a head injury.  An epidural hemorrhage is a medical emergency that can result in severe disability or death if not treated immediately.  Treatment depends on the cause, severity, and duration of your symptoms.  Work with your rehabilitation specialists as told by your health care provider. Recovery from brain injuries varies. This information is not intended to replace advice given to you by your health care provider. Make sure you discuss any questions you have  with your health care provider. Document Revised: 01/02/2019 Document Reviewed: 10/03/2018 Elsevier Patient Education  [place].

## 2020-08-28 NOTE — TOC Transition Note (Signed)
Transition of Care [facility]) - CM/SW Discharge Note   Patient Details  Name: Dean Meister Sr. MRN: [id number] Date of Birth: 02/14/1951  Transition of Care [facility] CM/SW Contact:  Konrad Penta, RN Phone Number: [phone] 08/28/2020, 12:42 PM   Clinical Narrative:   Damaris Schooner with Corene Cornea with [facility] to make aware that patient will transition to home today 08/28/20.     Final next level of care: [place] Barriers to Discharge: Continued Medical Work up   Patient Goals and CMS Choice Patient states their goals for this hospitalization and ongoing recovery are:: home with [facility] CMS Medicare.gov Compare Post Acute Care list provided to:: Patient Choice offered to / list presented to : Patient  Discharge Placement                       Discharge Plan and Services                  DME Agency: NA       HH Arranged: RN, PT, OT, Disease Management [place] Agency: [place] ([facility]) Date HH Agency Contacted: 08/27/20 Time [place]: 58 Representative spoke with at [place]: [place] ([place]) Interventions     Readmission Risk Interventions Readmission Risk Prevention Plan 06/07/2019  Transportation Screening Complete  PCP or Specialist Appt within 3-5 Days Complete  HRI or [place] Complete  Social Work Consult for [place] Planning/Counseling Complete  Palliative Care Screening Not Applicable  Medication Review [employer] photographer) Complete  Some recent data might be hidden
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
## 2020-08-29 NOTE — Progress Notes (Signed)
RN assisted patient to the [facility] and patient experienced difficulty sitting up in the bed on his own and moving to a standing position. When transferring back to the bed patient had difficulty lifting left leg. When RN asked patient if he would like to sit in the chair for a while patient states he would prefer to get back in the bed. Patient assisted back in the bed with difficulty. MD notified.   RN will continue to monitor.

## 2020-08-30 LAB — BASIC METABOLIC PANEL
Anion gap: 10 (ref 5–15)
BUN: 17 mg/dL (ref 8–23)
CO2: 21 mmol/L — ABNORMAL LOW (ref 22–32)
Calcium: 9.4 mg/dL (ref 8.9–10.3)
Chloride: 103 mmol/L (ref 98–111)
Creatinine, Ser: 1.11 mg/dL (ref 0.61–1.24)
GFR, Estimated: >60 mL/min (ref >60)
Glucose, Bld: 105 mg/dL — ABNORMAL HIGH (ref 70–99)
Potassium: 4.8 mmol/L (ref 3.5–5.1)
Sodium: 134 mmol/L — ABNORMAL LOW (ref 135–145)

## 2020-08-30 LAB — CBC WITH DIFFERENTIAL/PLATELET
Abs Immature Granulocytes: 0.09 10*3/uL — ABNORMAL HIGH (ref 0.00–0.07)
Basophils Absolute: 0 10*3/uL (ref 0.0–0.1)
Basophils Relative: 0 %
Eosinophils Absolute: 0.3 10*3/uL (ref 0.0–0.5)
Eosinophils Relative: 3 %
HCT: 26.9 % — ABNORMAL LOW (ref 39.0–52.0)
Hemoglobin: 8.5 g/dL — ABNORMAL LOW (ref 13.0–17.0)
Immature Granulocytes: 1 %
Lymphocytes Relative: 12 %
Lymphs Abs: 1.3 10*3/uL (ref 0.7–4.0)
MCH: 28.7 pg (ref 26.0–34.0)
MCHC: 31.6 g/dL (ref 30.0–36.0)
MCV: 90.9 fL (ref 80.0–100.0)
Monocytes Absolute: 0.6 10*3/uL (ref 0.1–1.0)
Monocytes Relative: 6 %
Neutro Abs: 8.7 10*3/uL — ABNORMAL HIGH (ref 1.7–7.7)
Neutrophils Relative %: 78 %
Platelets: 103 10*3/uL — ABNORMAL LOW (ref 150–400)
RBC: 2.96 MIL/uL — ABNORMAL LOW (ref 4.22–5.81)
RDW: 14.1 % (ref 11.5–15.5)
WBC: 11 10*3/uL — ABNORMAL HIGH (ref 4.0–10.5)
nRBC: 0 % (ref 0.0–0.2)

## 2020-08-30 LAB — PHOSPHORUS: Phosphorus: 3.1 mg/dL (ref 2.5–4.6)

## 2020-08-30 LAB — MAGNESIUM: Magnesium: 1.7 mg/dL (ref 1.7–2.4)
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
## 2020-08-30 NOTE — TOC Progression Note (Addendum)
Transition of Care (TOC) - Progression Note    Patient Details  Name: Dean [name]. MRN: [id number] Date of Birth: [birth year]  Transition of Care [facility]) CM/SW [place], [place] Phone Number: 08/30/2020, 12:22 PM  Clinical Narrative:     CSW met with pt to discuss SNF vs HH. Pt reports having a friend who went to [facility] who had success and that he would like to go to [facility]. CSW inquired if [facility] is unable to accept if would pt want to go to other SNFs. CSW explained services from SNF vs HH. Pt does want SNF and wants to see what facilities in [place] area would be able to accept him with [facility] as a preference.   Fl2 complete and referrals faxed in [place].   1515: CSW met with pt and pt son bedside to discuss bed offers. Pt initially wants [facility] but changes his mind once informed of recent covid cases there and need to quarantine. Pt now prefers [facility]. Pt is vaccinated but has copy of vaccines at home.   CSW confirmed with [place]. [place] started [place].   Expected Discharge Plan: Skilled Nursing Facility Barriers to Discharge: Continued Medical Work up  Expected Discharge Plan and Services Expected Discharge Plan: [place]         Expected Discharge Date: 08/28/20                 DME Agency: NA       HH Arranged: RN, PT, OT, Disease Management [place] Agency: [place] ([facility]) Date HH Agency Contacted: 08/27/20 Time [place]: 1009 Representative spoke with at [place]: [place] ([place]) Interventions    Readmission Risk Interventions Readmission Risk Prevention Plan 06/07/2019  Transportation Screening Complete  PCP or Specialist Appt within 3-5 Days Complete  HRI or [place] Complete  Social Work Consult for [place] Planning/Counseling Complete  Palliative Care Screening Not Applicable  Medication Review Press photographer) Complete  Some recent  data might be hidden
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
## 2020-08-31 NOTE — Care Management Important Message (Signed)
Important Message  Patient Details  Name: Dean Defenbaugh Sr. MRN: [id number] Date of Birth: 09-04-1951   Medicare Important Message Given:  Yes     Shelda Altes 08/31/2020, 9:41 AM

## 2020-09-01 LAB — SARS CORONAVIRUS 2 BY RT PCR (HOSPITAL ORDER, PERFORMED IN ~~LOC~~ HOSPITAL LAB): SARS Coronavirus 2: NEGATIVE
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
## 2020-09-02 LAB — CBC WITH DIFFERENTIAL/PLATELET
Abs Immature Granulocytes: 0.04 10*3/uL (ref 0.00–0.07)
Basophils Absolute: 0 10*3/uL (ref 0.0–0.1)
Basophils Relative: 0 %
Eosinophils Absolute: 0.5 10*3/uL (ref 0.0–0.5)
Eosinophils Relative: 6 %
HCT: 26.9 % — ABNORMAL LOW (ref 39.0–52.0)
Hemoglobin: 8.4 g/dL — ABNORMAL LOW (ref 13.0–17.0)
Immature Granulocytes: 1 %
Lymphocytes Relative: 14 %
Lymphs Abs: 1.2 10*3/uL (ref 0.7–4.0)
MCH: 28.3 pg (ref 26.0–34.0)
MCHC: 31.2 g/dL (ref 30.0–36.0)
MCV: 90.6 fL (ref 80.0–100.0)
Monocytes Absolute: 0.5 10*3/uL (ref 0.1–1.0)
Monocytes Relative: 6 %
Neutro Abs: 6.5 10*3/uL (ref 1.7–7.7)
Neutrophils Relative %: 73 %
Platelets: 140 10*3/uL — ABNORMAL LOW (ref 150–400)
RBC: 2.97 MIL/uL — ABNORMAL LOW (ref 4.22–5.81)
RDW: 13.9 % (ref 11.5–15.5)
WBC: 8.8 10*3/uL (ref 4.0–10.5)
nRBC: 0 % (ref 0.0–0.2)

## 2020-09-02 MED ORDER — FUROSEMIDE 20 MG PO TABS: 20.0000 mg | ORAL_TABLET | Freq: Every day | ORAL | 0 refills | Status: DC

## 2020-09-02 MED ORDER — CYCLOBENZAPRINE HCL 10 MG PO TABS: 10.0000 mg | ORAL_TABLET | Freq: Three times a day (TID) | ORAL | 0 refills | Status: AC | PRN

## 2020-09-02 NOTE — Progress Notes (Signed)
D/C instructions printed and placed in packet at nurse's station for transport along with script. Tele removed.

## 2020-09-02 NOTE — TOC Transition Note (Signed)
Transition of Care [facility]) - CM/SW Discharge Note   Patient Details  Name: Dean Broughton Sr. MRN: [id number] Date of Birth: 1950-11-04  Transition of Care [facility]) CM/SW Contact:  [name], [place] Phone Number: 09/02/2020, 1:52 PM   Clinical Narrative:    Patient will DC to: [place] SN[facility] Anticipated DC date: 09/02/20 Family notified: Pt will notify family Transport by: [name]   Per MD patient ready for DC to [facility] . RN, patient, patient's family, and facility notified of DC. Discharge Summary and FL2 sent to facility. RN to call report prior to discharge [phone]). DC packet on chart. Ambulance transport requested for patient.   CSW will sign off for now as social work intervention is no longer needed. Please consult [place] again if new needs arise.    Final next level of care: Skilled Nursing Facility Barriers to Discharge: No Barriers Identified   Patient Goals and CMS Choice Patient states their goals for this hospitalization and ongoing recovery are:: SNF for rehab CMS Medicare.gov Compare Post Acute Care list provided to:: Patient Choice offered to / list presented to : Patient  Discharge Placement              Patient chooses bed at: [facility] Patient to be transferred to facility by: [place] Name of family member notified: Pt will notify family Patient and family notified of of transfer: 09/02/20  Discharge Plan and Services                  DME Agency: NA       HH Arranged: RN,PT,OT,Disease Management [place] Agency: [place] ([place] Date HH Agency Contacted: 08/27/20 Time HH Agency Contacted: 1009 Representative spoke with at [place]: [place] ([place]) Interventions     Readmission Risk Interventions Readmission Risk Prevention Plan 06/07/2019  Transportation Screening Complete  PCP or Specialist Appt within 3-5 Days Complete  HRI or [place] Complete  Social Work Consult for [place]  Planning/Counseling Complete  Palliative Care Screening Not Applicable  Medication Review Press photographer) Complete  Some recent data might be hidden

## 2020-09-02 NOTE — Plan of Care (Signed)

## 2020-09-02 NOTE — Progress Notes (Signed)
CSW spoke with admissions at [facility] inquiring about [place]. [place] called to check on auth this morning. As of 945am Dean [name] is still being reviewed.

## 2020-09-02 NOTE — Discharge Summary (Signed)
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 decompression. No significant residual canal stenosis, improved from prior. Mild circumferential disc bulge and bilateral facet hypertrophy resulting in mild bilateral foraminal stenosis, right greater than left. L5-S1: Moderate right worse than left facet arthropathy. Unremarkable disc. No foraminal or canal stenosis. IMPRESSION: 1. Acute to subacute transversely oriented fracture through the superior aspect of the T11 vertebral body with fracture lines extending into the posterior elements (Chance type fracture). Associated anteroinferior compression fracture of T10. There is disruption of the disc space posteriorly at T10-11 without definite disruption of the posterior longitudinal ligament. There is tearing of the interspinous ligament and supraspinous ligament. 2. Posterior epidural hematoma on the left at the T10-11  level measuring 11 x 6 x 22 mm resulting in leftward deviation and compression of the cord. There is cord edema extending from approximately the T9-10 to the T11-12 levels. Emergent neurosurgical evaluation is recommended. 3. Additional epidural fluid collection within the  upper thoracic spine extending from approximately the T2-3 level to approximately T5-6 resulting in mild compression and leftward deviation of the thoracic cord. 4. Interval posterior decompression from L2 through L5. No more than mild residual canal stenosis at these levels. Moderate bilateral foraminal stenosis at L3-4 is slightly progressed from prior. Critical Value/emergent results were called by telephone at the time of interpretation on 08/12/2020 at 12:56 pm to provider [facility] , who verbally acknowledged these results. Electronically Signed   By: Davina Poke D.O.   On: 08/12/2020 13:01     Labs:   Basic Metabolic Panel: Recent Labs  Lab 08/27/20 0326 08/28/20 0318 08/30/20 0524  NA 134* 135 134*  K 4.5 4.9 4.8  CL 104 104 103  CO2 21* 20* 21*  GLUCOSE 92 116* 105*  BUN 18 17 17   CREATININE 1.15 1.16 1.11  CALCIUM 9.1 9.4 9.4  MG 1.6* 1.7 1.7  PHOS  --   --  3.1   GFR Estimated Creatinine Clearance: 70.4 mL/min (by C-G formula based on SCr of 1.11 mg/dL). Liver Function Tests: No results for input(s): AST, ALT, ALKPHOS, BILITOT, PROT, ALBUMIN in the last 168 hours. No results for input(s): LIPASE, AMYLASE in the last 168 hours. No results for input(s): AMMONIA in the last 168 hours. Coagulation profile No results for input(s): INR, PROTIME in the last 168 hours.  CBC: Recent Labs  Lab 08/27/20 0326 08/28/20 0318 08/30/20 0524 09/02/20 0320  WBC 13.9* 12.0* 11.0* 8.8  NEUTROABS 10.9* 9.8* 8.7* 6.5  HGB 7.4* 8.5* 8.5* 8.4*  HCT 23.1* 25.5* 26.9* 26.9*  MCV 91.3 88.5 90.9 90.6  PLT 106* 78* 103* 140*   Cardiac Enzymes: No results for input(s): CKTOTAL, CKMB, CKMBINDEX, TROPONINI in  the last 168 hours. BNP: Invalid input(s): POCBNP CBG: No results for input(s): GLUCAP in the last 168 hours. D-Dimer No results for input(s): DDIMER in the last 72 hours. Hgb A1c No results for input(s): HGBA1C in the last 72 hours. Lipid Profile No results for input(s): CHOL, HDL, LDLCALC, TRIG, CHOLHDL, LDLDIRECT in the last 72 hours. Thyroid function studies No results for input(s): TSH, T4TOTAL, T3FREE, THYROIDAB in the last 72 hours.  Invalid input(s): FREET3 Anemia work up No results for input(s): VITAMINB12, FOLATE, FERRITIN, TIBC, IRON, RETICCTPCT in the last 72 hours. Microbiology Recent Results (from the past 240 hour(s))  SARS Coronavirus 2 by RT PCR (hospital order, performed in [facility] hospital lab) Nasopharyngeal Nasopharyngeal Swab     Status: None   Collection Time: 09/01/20  3:32 PM   Specimen: Nasopharyngeal Swab  Result Value Ref Range Status   SARS Coronavirus 2 NEGATIVE NEGATIVE Final    Comment: (NOTE) SARS-CoV-2 target nucleic acids are NOT DETECTED.  The SARS-CoV-2 RNA is generally detectable in upper and lower respiratory specimens during the acute phase of infection. The lowest concentration of SARS-CoV-2 viral copies this assay can detect is 250 copies / mL. A negative result does not preclude SARS-CoV-2 infection and should not be used as the sole basis for treatment or other patient management decisions.  A negative result may occur with improper specimen collection / handling, submission of specimen other than nasopharyngeal swab, presence of viral mutation(s) within the areas targeted by this assay, and inadequate number of viral copies (<250 copies / mL). A negative result must be combined with clinical observations, patient history, and epidemiological information.  Fact Sheet for Patients:   StrictlyIdeas.no  Fact Sheet for Healthcare Providers: BankingDealers.co.za  This test is  not yet  approved or  cleared by the [place] and has been authorized for detection and/or diagnosis of SARS-CoV-2 by FDA under an Emergency Use Authorization (EUA).  This EUA will remain in effect (meaning this test can be used) for the duration of the COVID-19 declaration under Section 564(b)(1) of the Act, 21 U.S.C. section 360bbb-3(b)(1), unless the authorization is terminated or revoked sooner.  Performed at [facility], [place] [address], [place], [place] [address]      Signed: Terrilee Croak  [facility] 09/02/2020, 1:00 PM

## 2020-09-10 ENCOUNTER — Ambulatory Visit (INDEPENDENT_AMBULATORY_CARE_PROVIDER_SITE_OTHER): Payer: Medicare (Managed Care)

## 2020-09-10 DIAGNOSIS — I255 Ischemic cardiomyopathy: Secondary | ICD-10-CM | POA: Diagnosis not present

## 2020-09-10 LAB — CUP PACEART REMOTE DEVICE CHECK
Battery Remaining Longevity: 93 mo
Battery Voltage: 3 V
Brady Statistic RV Percent Paced: 0.03 %
Date Time Interrogation Session: 20211216202450
HighPow Impedance: 69 Ohm
Implantable Lead Implant Date: 20180116
Implantable Lead Location: 753860
Implantable Lead Model: 6935
Implantable Pulse Generator Implant Date: 20180116
Lead Channel Impedance Value: 266 Ohm
Lead Channel Impedance Value: 342 Ohm
Lead Channel Pacing Threshold Amplitude: 0.625 V
Lead Channel Pacing Threshold Pulse Width: 0.4 ms
Lead Channel Sensing Intrinsic Amplitude: 9.125 mV
Lead Channel Sensing Intrinsic Amplitude: 9.125 mV
Lead Channel Setting Pacing Amplitude: 2.5 V
Lead Channel Setting Pacing Pulse Width: 0.4 ms
Lead Channel Setting Sensing Sensitivity: 0.3 mV

## 2020-09-13 ENCOUNTER — Telehealth: Payer: Self-pay | Admitting: *Deleted

## 2020-09-13 NOTE — Telephone Encounter (Signed)
Dean Meredith Leeds, MD  09/10/2020 4:19 PM EST      Abnormal device interrogation reviewed. Lead parameters and battery status stable. Optivol elevated. Take lasix 40 mg daily for the next 4 days.

## 2020-09-13 NOTE — Telephone Encounter (Signed)
Pt reports that he is agreeable but at [facility] and will need me to call RN there to inform. Spoke to [place], Therapist, sports at [employer], advised of increased Lasix recommendation. Asks that I fax order to [phone].

## 2020-09-14 NOTE — Progress Notes (Signed)
No ICM remote transmission received for 09/06/2020 and next ICM transmission scheduled for 10/04/2020.
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
## 2020-09-21 ENCOUNTER — Other Ambulatory Visit: Payer: Self-pay

## 2020-09-21 ENCOUNTER — Ambulatory Visit (INDEPENDENT_AMBULATORY_CARE_PROVIDER_SITE_OTHER): Payer: Medicare (Managed Care) | Admitting: Physician Assistant

## 2020-09-21 VITALS — BP 155/74 | HR 105 | Ht 66.0 in | Wt 222.0 lb

## 2020-09-21 DIAGNOSIS — I255 Ischemic cardiomyopathy: Secondary | ICD-10-CM | POA: Diagnosis not present

## 2020-09-21 DIAGNOSIS — Z79899 Other long term (current) drug therapy: Secondary | ICD-10-CM | POA: Diagnosis not present

## 2020-09-21 DIAGNOSIS — I480 Paroxysmal atrial fibrillation: Secondary | ICD-10-CM | POA: Diagnosis not present

## 2020-09-21 DIAGNOSIS — I5023 Acute on chronic systolic (congestive) heart failure: Secondary | ICD-10-CM | POA: Diagnosis not present

## 2020-09-21 DIAGNOSIS — Z95810 Presence of automatic (implantable) cardiac defibrillator: Secondary | ICD-10-CM

## 2020-09-21 MED ORDER — CARVEDILOL 12.5 MG PO TABS: 12.5000 mg | ORAL_TABLET | Freq: Two times a day (BID) | ORAL | 1 refills | Status: DC

## 2020-09-21 MED ORDER — FUROSEMIDE 40 MG PO TABS
40.0000 mg | ORAL_TABLET | Freq: Two times a day (BID) | ORAL | 1 refills | Status: DC
Start: 2020-09-21 — End: 2022-08-27

## 2020-09-21 NOTE — Patient Instructions (Signed)
Medication Instructions:   STOP TAKING: FUROSEMIDE 20  MG   START TAKING: FUROSEMIDE 40 MG TWICE A DAY   START TAKING: CARVEDILOL 12.5 MG  TWICE DAY   *If you need a refill on your cardiac medications before your next appointment, please call your pharmacy*   Lab Work:  BMET  TODAY   RETURN IN ONE WEEK FOR BMET    If you have labs (blood work) drawn today and your tests are completely normal, you will receive your results only by: [name] MyChart Message (if you have MyChart) OR . A paper copy in the mail If you have any lab test that is abnormal or we need to change your treatment, we will call you to review the results.   Testing/Procedures: NONE ORDERED  TODAY     Follow-Up: At [facility], you and your health needs are our priority.  As part of our continuing mission to provide you with exceptional heart care, we have created designated Provider Care Teams.  These Care Teams include your primary Cardiologist (physician) and Advanced Practice Providers (APPs -  Physician Assistants and Nurse Practitioners) who all work together to provide you with the care you need, when you need it.  We recommend signing up for the patient portal called "MyChart".  Sign up information is provided on this After Visit Summary.  MyChart is used to connect with patients for Virtual Visits (Telemedicine).  Patients are able to view lab/test results, encounter notes, upcoming appointments, etc.  Non-urgent messages can be sent to your provider as well.   To learn more about what you can do with MyChart, go to [email or web address].    Your next appointment:    WITH DR HILTY  1 TO 2 WEEKS    1 year(s)  The format for your next appointment:   In Person  Provider:   You may see Will Jorja Loa, MD or one of the following Advanced Practice Providers on your designated Care Team:    Gypsy Balsam, NP  Francis Dowse, PA-C  Casimiro Needle "Otilio Saber, [place]    Other Instructions

## 2020-09-22 LAB — BASIC METABOLIC PANEL
BUN/Creatinine Ratio: 16 (ref 10–24)
BUN: 21 mg/dL (ref 8–27)
CO2: 19 mmol/L — ABNORMAL LOW (ref 20–29)
Calcium: 10.4 mg/dL — ABNORMAL HIGH (ref 8.6–10.2)
Chloride: 99 mmol/L (ref 96–106)
Creatinine, Ser: 1.35 mg/dL — ABNORMAL HIGH (ref 0.76–1.27)
GFR calc Af Amer: 61 mL/min/{1.73_m2} (ref >59)
GFR calc non Af Amer: 53 mL/min/{1.73_m2} — ABNORMAL LOW (ref >59)
Glucose: 138 mg/dL — ABNORMAL HIGH (ref 65–99)
Potassium: 4.8 mmol/L (ref 3.5–5.2)
Sodium: 136 mmol/L (ref 134–144)

## 2020-09-23 NOTE — Progress Notes (Signed)
Remote ICD transmission.   

## 2020-09-28 ENCOUNTER — Other Ambulatory Visit: Payer: Medicare (Managed Care)

## 2020-10-08 ENCOUNTER — Other Ambulatory Visit: Payer: Medicare Other

## 2020-10-08 ENCOUNTER — Other Ambulatory Visit: Payer: Self-pay

## 2020-10-08 NOTE — Progress Notes (Signed)
No ICM remote transmission received for 10/04/2020 and next ICM transmission scheduled for 11/15/2020.   

## 2020-10-11 ENCOUNTER — Ambulatory Visit: Payer: Medicare (Managed Care) | Admitting: Physician Assistant
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
## 2020-10-12 ENCOUNTER — Other Ambulatory Visit: Payer: Medicare Other

## 2020-10-14 ENCOUNTER — Other Ambulatory Visit: Payer: Medicare Other

## 2020-10-15 ENCOUNTER — Ambulatory Visit: Payer: Medicare Other | Admitting: Adult Health

## 2020-10-22 ENCOUNTER — Telehealth: Payer: Self-pay | Admitting: Pharmacist
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
## 2020-10-22 NOTE — Telephone Encounter (Signed)
Would route question to neurosurgery - they need to clear prior to restarting it.  Dr Lemmie Evens
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
## 2020-11-02 ENCOUNTER — Other Ambulatory Visit: Payer: Self-pay

## 2020-11-02 ENCOUNTER — Ambulatory Visit (INDEPENDENT_AMBULATORY_CARE_PROVIDER_SITE_OTHER): Payer: Medicare Other | Admitting: Student

## 2020-11-02 ENCOUNTER — Encounter: Payer: Self-pay | Admitting: Student

## 2020-11-02 VITALS — BP 153/76 | HR 99 | Ht 66.0 in | Wt 214.6 lb

## 2020-11-02 DIAGNOSIS — Z8674 Personal history of sudden cardiac arrest: Secondary | ICD-10-CM | POA: Diagnosis not present

## 2020-11-02 DIAGNOSIS — I255 Ischemic cardiomyopathy: Secondary | ICD-10-CM | POA: Diagnosis not present

## 2020-11-02 DIAGNOSIS — I5042 Chronic combined systolic (congestive) and diastolic (congestive) heart failure: Secondary | ICD-10-CM

## 2020-11-02 DIAGNOSIS — E118 Type 2 diabetes mellitus with unspecified complications: Secondary | ICD-10-CM

## 2020-11-02 DIAGNOSIS — I10 Essential (primary) hypertension: Secondary | ICD-10-CM

## 2020-11-02 DIAGNOSIS — I480 Paroxysmal atrial fibrillation: Secondary | ICD-10-CM

## 2020-11-02 DIAGNOSIS — Z95810 Presence of automatic (implantable) cardiac defibrillator: Secondary | ICD-10-CM

## 2020-11-02 DIAGNOSIS — I2510 Atherosclerotic heart disease of native coronary artery without angina pectoris: Secondary | ICD-10-CM

## 2020-11-02 DIAGNOSIS — E785 Hyperlipidemia, unspecified: Secondary | ICD-10-CM

## 2020-11-02 DIAGNOSIS — N183 Chronic kidney disease, stage 3 unspecified: Secondary | ICD-10-CM

## 2020-11-02 MED ORDER — CARVEDILOL 12.5 MG PO TABS: 12.5000 mg | ORAL_TABLET | Freq: Two times a day (BID) | ORAL | 1 refills | Status: DC

## 2020-11-02 NOTE — Patient Instructions (Addendum)
Medication Instructions:  Decrease Coreg (12.5 mg Daily ) Stop Bidil, Continue Eliquis, Continue Entresto *If you need a refill on your cardiac medications before your next appointment, please call your pharmacy*   Lab Work: BMP.TSH,CBC, Magnesium Level If you have labs (blood work) drawn today and your tests are completely normal, you will receive your results only by: [name] MyChart Message (if you have MyChart) OR . A paper copy in the mail If you have any lab test that is abnormal or we need to change your treatment, we will call you to review the results.   Testing/Procedures: No Testing   Follow-Up: At [facility], you and your health needs are our priority.  As part of our continuing mission to provide you with exceptional heart care, we have created designated Provider Care Teams.  These Care Teams include your primary Cardiologist (physician) and Advanced Practice Providers (APPs -  Physician Assistants and Nurse Practitioners) who all work together to provide you with the care you need, when you need it.   Your next appointment:   3 month(s)  The format for your next appointment:   In Person  Provider:   K. Mali Hilty, MD   Other Instructions  Keep Log of Weight . If weight gain of 3 lbs. In a Day and 5 lbs. In a week please call the office. Maintain Daily Blood pressure Log. Try to maintain a Low Sodium diet

## 2020-11-03 ENCOUNTER — Other Ambulatory Visit: Payer: Self-pay | Admitting: Internal Medicine

## 2020-11-03 ENCOUNTER — Other Ambulatory Visit: Payer: Self-pay

## 2020-11-03 DIAGNOSIS — Z79899 Other long term (current) drug therapy: Secondary | ICD-10-CM

## 2020-11-03 LAB — TSH: TSH: 4.75 u[IU]/mL — ABNORMAL HIGH (ref 0.450–4.500)

## 2020-11-03 LAB — BASIC METABOLIC PANEL
BUN/Creatinine Ratio: 12 (ref 10–24)
BUN: 16 mg/dL (ref 8–27)
CO2: 20 mmol/L (ref 20–29)
Calcium: 10.3 mg/dL — ABNORMAL HIGH (ref 8.6–10.2)
Chloride: 99 mmol/L (ref 96–106)
Creatinine, Ser: 1.38 mg/dL — ABNORMAL HIGH (ref 0.76–1.27)
GFR calc Af Amer: 59 mL/min/{1.73_m2} — ABNORMAL LOW (ref >59)
GFR calc non Af Amer: 51 mL/min/{1.73_m2} — ABNORMAL LOW (ref >59)
Glucose: 103 mg/dL — ABNORMAL HIGH (ref 65–99)
Potassium: 4.1 mmol/L (ref 3.5–5.2)
Sodium: 141 mmol/L (ref 134–144)

## 2020-11-03 LAB — CBC
Hematocrit: 37.7 % (ref 37.5–51.0)
Hemoglobin: 12 g/dL — ABNORMAL LOW (ref 13.0–17.7)
MCH: 27.3 pg (ref 26.6–33.0)
MCHC: 31.8 g/dL (ref 31.5–35.7)
MCV: 86 fL (ref 79–97)
Platelets: 182 10*3/uL (ref 150–450)
RBC: 4.39 x10E6/uL (ref 4.14–5.80)
RDW: 15.3 % (ref 11.6–15.4)
WBC: 12.6 10*3/uL — ABNORMAL HIGH (ref 3.4–10.8)

## 2020-11-03 LAB — MAGNESIUM: Magnesium: 1.4 mg/dL — ABNORMAL LOW (ref 1.6–2.3)

## 2020-11-03 MED ORDER — MAGNESIUM OXIDE 400 MG PO TABS
ORAL_TABLET | ORAL | 3 refills | Status: DC
Start: 2020-11-03 — End: 2022-01-31

## 2020-11-15 ENCOUNTER — Ambulatory Visit (INDEPENDENT_AMBULATORY_CARE_PROVIDER_SITE_OTHER): Payer: Medicare Other

## 2020-11-15 ENCOUNTER — Telehealth: Payer: Self-pay

## 2020-11-15 DIAGNOSIS — I5022 Chronic systolic (congestive) heart failure: Secondary | ICD-10-CM

## 2020-11-15 DIAGNOSIS — Z95810 Presence of automatic (implantable) cardiac defibrillator: Secondary | ICD-10-CM

## 2020-11-15 NOTE — Telephone Encounter (Signed)
Remote ICM transmission received.  Attempted call to patient regarding ICM remote transmission and left detailed message per DPR to return call.  Advised to return call for any fluid symptoms or questions.      

## 2020-11-15 NOTE — Progress Notes (Signed)
EPIC Encounter for ICM Monitoring  Patient Name: Dean Doberstein Sr. is a 70 y.o. male Date: 11/15/2020 Primary Care Physican: [name], MD Primary Cardiologist:Hilty Electrophysiologist:Camnitz 2/8/2022OfficeWeight: 214lbs  Time in AF0.0 hr/day (0.0%) (taking Eliquis)  Attempted call to patient and unable to reach.  Left message to return call regarding transmission. Transmission reviewed.   OptivolThoracic impedancesuggesting possible fluid accumulation starting 10/09/2020.  Fluid index > threshold starting 10/23/2020.  Prescribed:  Furosemide40 mgTake 40 mg by mouthtwice a day   Spironolactone 25 mg take 1 tablet daily  Eliquis 5 mg take 1 tablet twice a day  Labs: 11/02/2020 Creatinine 1.38, BUN 16, Potassium 4.1, Sodium 141, GFR 51-59 09/21/2020 Creatinine 1.35, BUN 21, Potassium 4.8, Sodium 136 GFR 53-61 06/30/2020 Creatinine 2.10, BUN 34, Potassium 4.3, Sodium 136, GFR 31 06/29/2020 Creatinine 2.10, BUN 31, Potassium 4.0, Sodium 138, GFR 29  06/02/2020 Creatinine 2.18, BUN 43, Potassium 3.8, Sodium 133, GFR 30-34 01/23/2020 Creatinine 1.42, BUN 17, Potassium 4.0, Sodium 143, GFR 50-57 12/11/2019 Creatinine 1.40, BUN 21, Potassium 4.3, Sodium 143, GFR 51-58 A complete set of results can be found in Results Review.  Recommendations:Left voice mail with ICM number and encouraged to call if experiencing any fluid symptoms.  Copy sent to Dr Debara Pickett for review and recommendations if needed.  Follow-up plan: ICM clinic phone appointment on 11/22/2020 (manual) to recheck fluid levels. 91 day device clinic remote transmission 12/10/2020.   EP/Cardiology Office Visits:01/31/2021 with Dr Debara Pickett.  Recall 09/16/2021 with Dr Curt Bears.  Copy of ICM check sent to [place]  3 month ICM trend: 11/15/2020.    1 Year ICM trend:       Rosalene Billings, RN 11/15/2020 10:15 AM

## 2020-11-16 NOTE — Progress Notes (Signed)
Message received from Dr Debara Pickett:  Received: Today Hilty, Nadean Corwin, MD  Keeghan Mcintire Panda, RN; Fidel Levy, RN We can reach out and see how he is feeling.   Dr H    Patient will be contacted by Sheral Apley RN

## 2020-11-17 ENCOUNTER — Telehealth: Payer: Self-pay | Admitting: Internal Medicine

## 2020-11-17 MED ORDER — AMIODARONE HCL 200 MG PO TABS
100.0000 mg | ORAL_TABLET | Freq: Every day | ORAL | 3 refills | Status: DC
Start: 2020-11-17 — End: 2021-03-07

## 2020-11-17 NOTE — Telephone Encounter (Signed)
Attempted to contact patient in regards to 11/15/20 clinical support note/ICD check - fluid accumulation noted  Patient's VM is full - unable to leave message  Also, will need to change patient's amiodarone prescription to 200mg  tablets - take 1/2 tablet (100mg ) daily Amiodarone 100mg  is not covered on patient's formulary

## 2020-11-22 ENCOUNTER — Telehealth: Payer: Self-pay

## 2020-11-22 NOTE — Telephone Encounter (Signed)
Attempted ICM Call to patient to request a device manual transmission but mail box is full.  Unable to leave a message.

## 2020-11-24 NOTE — Telephone Encounter (Signed)
Attempted to reach patient and his VM box is full

## 2020-11-26 NOTE — Progress Notes (Unsigned)
No ICM remote transmission received for ***/***/2022 and next ICM transmission scheduled for ***/***/2022.

## 2020-11-28 ENCOUNTER — Other Ambulatory Visit: Payer: Self-pay | Admitting: Internal Medicine

## 2020-11-29 ENCOUNTER — Other Ambulatory Visit: Payer: Self-pay

## 2020-11-29 MED ORDER — APIXABAN 5 MG PO TABS
5.0000 mg | ORAL_TABLET | Freq: Two times a day (BID) | ORAL | 1 refills | Status: DC
Start: 2020-11-29 — End: 2021-03-07

## 2020-12-09 ENCOUNTER — Telehealth: Payer: Self-pay

## 2020-12-09 NOTE — Telephone Encounter (Signed)
LMOVM for pt to send missed HF transmission.

## 2020-12-10 ENCOUNTER — Ambulatory Visit (INDEPENDENT_AMBULATORY_CARE_PROVIDER_SITE_OTHER): Payer: Medicare Other

## 2020-12-10 DIAGNOSIS — I255 Ischemic cardiomyopathy: Secondary | ICD-10-CM | POA: Diagnosis not present

## 2020-12-10 LAB — CUP PACEART REMOTE DEVICE CHECK
Battery Remaining Longevity: 94 mo
Battery Voltage: 2.99 V
Brady Statistic RV Percent Paced: 0.01 %
Date Time Interrogation Session: 20220318054344
HighPow Impedance: 65 Ohm
Implantable Lead Implant Date: 20180116
Implantable Lead Location: 753860
Implantable Lead Model: 6935
Implantable Pulse Generator Implant Date: 20180116
Lead Channel Impedance Value: 304 Ohm
Lead Channel Impedance Value: 380 Ohm
Lead Channel Pacing Threshold Amplitude: 0.625 V
Lead Channel Pacing Threshold Pulse Width: 0.4 ms
Lead Channel Sensing Intrinsic Amplitude: 10.625 mV
Lead Channel Sensing Intrinsic Amplitude: 10.625 mV
Lead Channel Setting Pacing Amplitude: 2.5 V
Lead Channel Setting Pacing Pulse Width: 0.4 ms
Lead Channel Setting Sensing Sensitivity: 0.3 mV

## 2020-12-10 NOTE — Progress Notes (Signed)
No ICM remote transmission received for 12/06/2020 and next ICM transmission scheduled for 01/03/2021.

## 2020-12-16 NOTE — Progress Notes (Signed)
Remote ICD transmission.   

## 2020-12-23 ENCOUNTER — Telehealth: Payer: Self-pay

## 2020-12-23 NOTE — Telephone Encounter (Addendum)
Patient called device clinic to follow up on elevated OptiVol levels. Transmission received on 03/18/ showed increased OptiVol levels. Patient states he was advised by RN to take" 3 extra fluid pills." Patient is followed by HF Clinic. Patient sent another transmission today still showing elevated fluid levels. Sending to Dr. Shonna Chock for review.

## 2020-12-23 NOTE — Telephone Encounter (Signed)
The patient would like for the nurse to check his fluid. Last week a nurse called him telling him to take 3 extra pills for 3 days. I asked him to send a manual transmission with his home remote monitor. Transmission received. I let him speak with Janett Billow, rn.

## 2020-12-24 ENCOUNTER — Other Ambulatory Visit: Payer: Self-pay

## 2020-12-24 ENCOUNTER — Ambulatory Visit: Payer: Medicare Other | Attending: Neurosurgery

## 2020-12-24 DIAGNOSIS — R262 Difficulty in walking, not elsewhere classified: Secondary | ICD-10-CM | POA: Diagnosis not present

## 2020-12-24 DIAGNOSIS — M6281 Muscle weakness (generalized): Secondary | ICD-10-CM

## 2020-12-24 NOTE — Patient Instructions (Signed)
Access Code: [email or web address] URL: [email or web address] Date: 12/24/2020 Prepared by: Kathreen Cornfield  Exercises Seated Long Arc Quad - 2 x daily - 7 x weekly - 2 sets - 10 reps Seated Active Assistive Knee Flexion - Wheelchair - 2 x daily - 7 x weekly - 2 sets - 10 reps Proper Sit to Stand Technique - 2 x daily - 7 x weekly - 2 sets - 5 reps

## 2020-12-24 NOTE — Telephone Encounter (Addendum)
Attempted to reach pt to advise Dr. Curt Bears recommends OV w/ gen card APP next week for further evaluation. Unable to leave message, mailbox full

## 2020-12-24 NOTE — Therapy (Signed)
[facility], [name], [address] Phone: [phone]   Fax:  [phone]  Physical Therapy Evaluation  Patient Details  Name: Dean Mckesson Sr. MRN: [id number] Date of Birth: 12/20/1950 Referring Provider (PT): Earnie Larsson, MD   Encounter Date: 12/24/2020   PT End of Session - 12/24/20 1336    Visit Number 1    Number of Visits 16    Date for PT Re-Evaluation 02/18/21    Authorization Time Period FOTO visit 6, 10; [place] Modifier Visit #15    Progress Note Due on Visit 10    PT Start Time 1315    PT Stop Time 1400    PT Time Calculation (min) 45 min    Equipment Utilized During Treatment Gait belt   manual wheelchair   Activity Tolerance Patient tolerated treatment well    Behavior During Therapy WFL for tasks assessed/performed           Past Medical History:  Diagnosis Date  . 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 Arthritis    "maybe in his spine" (09/05/2016)  . Asthma    pt. denies  . Cardiac arrest ([place]) 08/27/2016   REQUIRING CPR, SHOCK & MEDICATIONS  . CHF (congestive heart failure) ([place])   . Coronary artery disease    90% ostRCA stenosis and total occlusion of dRCA with L-->R collaterals 2017  . Dysrhythmia   . Gout   . High cholesterol   . Hypertension   . Ischemic cardiomyopathy   . LBBB (left bundle branch block)   . OSA on CPAP    does not wear CPAP  . Type II diabetes mellitus ([place])     Past Surgical History:  Procedure Laterality Date  . CARDIAC CATHETERIZATION N/A 09/07/2016   Procedure: Left Heart Cath and Coronary Angiography;  Surgeon: Peter M Martinique, MD;  Location: [facility];  Service: Cardiovascular;  Laterality: N/A;  . COLONOSCOPY WITH PROPOFOL N/A 08/30/2018   Procedure: COLONOSCOPY WITH PROPOFOL;  Surgeon: Carol Ada, MD;  Location: [facility];  Service: Endoscopy;  Laterality: N/A;  . EP IMPLANTABLE DEVICE N/A 10/10/2016    Procedure: ICD Implant;  Surgeon: Will Meredith Leeds, MD;  Location: [facility];  Service: Cardiovascular;  Laterality: N/A;  . LUMBAR LAMINECTOMY/DECOMPRESSION MICRODISCECTOMY N/A 01/18/2018   Procedure: L2-3, L3-4, L-4-5 CENTRAL DECOMPRESSION;  Surgeon: [name], MD;  Location: [facility];  Service: Orthopedics;  Laterality: N/A;  . LUMBAR LAMINECTOMY/DECOMPRESSION MICRODISCECTOMY N/A 08/12/2020   Procedure: THORACIC ELEVEN LAMINECTOMY;  Surgeon: Earnie Larsson, MD;  Location: [facility];  Service: Neurosurgery;  Laterality: N/A;  . POLYPECTOMY  08/30/2018   Procedure: POLYPECTOMY;  Surgeon: Carol Ada, MD;  [facility];  Service: Endoscopy;;    There were no vitals filed for this visit.    Subjective Assessment - 12/24/20 1321    Subjective Pt reports he fractured a bone in his thoracic spine on 06/28/21 and spent week in the hospital getting 3 weeks of inpatient rehab. He did not have any HHPT. I have a problem with my legs. I can take a few steps, but I have to use the walker and I don't have much power. My arms are pretty good. Pt has tingling and pins and needles in his feet.    Patient is accompained by: Family member   daughter, Dean [name]   Limitations Lifting;Standing;Walking;House hold activities    Patient Stated Goals I want to be able to walk without my walker.  Currently in Pain? No/denies    Pain Location --              [facility] PT Assessment - 12/24/20 0001      Assessment   Medical Diagnosis S22.088A (ICD-10-CM) - Other fracture of t11-T12 vertebra, initial encounter for closed fracture    Referring Provider (PT) Earnie Larsson, MD    Onset Date/Surgical Date 06/28/20    Hand Dominance Right    Next MD Visit next month    Prior Therapy Yes, IRF      Precautions   Precautions None      Restrictions   Weight Bearing Restrictions No      Balance Screen   Has the patient fallen in the past 6 months Yes    How many times? 4-5    Has the patient had a  decrease in activity level because of a fear of falling?  Yes      [place] Private residence    Living Arrangements Children   daughter, grandchildren   Available Help at Discharge Family    Type of [place] Access Level entry    [place] Two level    Alternate Level Stairs-Number of Steps 14    Alternate Level Stairs-Rails Can reach both    [place] - 2 wheels;Cane - single point;Bedside commode;Shower seat;Wheelchair - manual      Prior Function   Level of Independence Needs assistance with homemaking;Needs assistance with ADLs    Vocation Retired      Observation/Other Assessments   Focus on Therapeutic Outcomes (FOTO)  51% ability, 60% expected      Transfers   Five time sit to stand comments  48 seconds      6 minute walk test results    Aerobic Endurance Distance Walked 63                      Objective measurements completed on examination: See above findings.               PT Education - 12/24/20 1517    Education Details Diagnosis, Prognosis, POC, HEP, FOTO    Person(s) Educated Patient;Child(ren)    Methods Explanation;Demonstration;Tactile cues;Verbal cues;Handout    Comprehension Verbalized understanding;Returned demonstration;Verbal cues required;Tactile cues required;Need further instruction            PT Short Term Goals - 12/24/20 1500      PT SHORT TERM GOAL #1   Title Pt will be I and compliant with initial HEP.    Baseline provided at eval    Time 3    Period Weeks    Status New    Target Date 01/14/21      PT SHORT TERM GOAL #2   Title Pt will decrease 5XSTS to <30 seconds.    Baseline 48 seconds    Time 4    Period Weeks    Status New    Target Date 01/21/21      PT SHORT TERM GOAL #3   Title Pt will increase gait distance during 6MWT to 300 ft    Baseline 63 feet    Time 4    Period Weeks    Status New    Target Date 01/21/21             PT Long  Term Goals - 12/24/20 1502      PT LONG TERM GOAL #1  Title Pt will be independent with long term HEP for continued strength and endurance training to maintain mobility.    Time 8    Period Weeks    Status New    Target Date 02/18/21      PT LONG TERM GOAL #2   Title Pt will decrease 5xSTS time to </= 15 seconds to demonstrate improvement in lower extremity power and strength for decreased fall risk.    Baseline 48 seconds    Time 8    Period Weeks    Status New    Target Date 02/18/21      PT LONG TERM GOAL #3   Title Pt will demonstrate at least 4+/5 lower extremity MMT.    Time 8    Period Weeks    Status New    Target Date 02/18/21      PT LONG TERM GOAL #4   Title Pt will increase gait distance during 6MWT to 1831 feet, per age related norms to decrease fall risk and initiate community mobility.    Baseline 63 feet    Time 8    Period Weeks    Status New    Target Date 02/18/21      PT LONG TERM GOAL #5   Title Pt will improve FOTO ability to at least 60% to demonstrate meaningful change and improvement in perceived level of functional ability.    Baseline 51% ability    Time 8    Period Weeks    Status New    Target Date 02/18/21                  Plan - 12/24/20 1507    Clinical Impression Statement Pt is a 70 yo male who presents to OPPT with decreased mobility, poor LE strength, limited gait distance, and poor endurance following thoracic vertebral fracture 06/28/20 with subsequent hospital stay. Pt has been sedentary, spending most of his time upstairs, but is able to negotiate stairs with B HR. He has had 4-5 falls in the last 6 months, per daughter, with pt reporting his leg giving out. He is motivated to get stronger with his goal being to walk without a walker. He lives with his daughter and her children, who help him with IADLs, and occasionally with donning/doffing shoes and washing his back. He was educated on diagnosis, prognosis, POC, HEP and FOTO.  Pt and his daughter verbalized understanding and consent to tx. He could benefit from skilled physical therapy 2x/week for 8 weeks to improve LE strength, increase gait distance, and safety with ambulatory tasks.    Personal Factors and Comorbidities Age;Fitness;Time since onset of injury/illness/exacerbation;Comorbidity 3+    Comorbidities HLD, HTN, arthritis, DM, CHF,    Examination-Activity Limitations Bathing;Locomotion Level;Transfers;Bend;Lift;Stand;Stairs;Squat    Examination-Participation Restrictions Community Activity;Interpersonal Relationship;Meal Prep    Stability/Clinical Decision Making Evolving/Moderate complexity    Clinical Decision Making Moderate    Rehab Potential Good    PT Frequency 2x / week    PT Duration 8 weeks    PT Treatment/Interventions ADLs/Self Care Home Management;Aquatic Therapy;Cryotherapy;Electrical Stimulation;Iontophoresis 4mg /ml Dexamethasone;Moist Heat;Gait training;Stair training;Functional mobility training;Therapeutic activities;Therapeutic exercise;Balance training;Neuromuscular re-education;Patient/family education;Manual techniques;Passive range of motion    PT Next Visit Plan Assess response to HEP and update PRN, assess LE MMT, consider a balance test; LE strengthening    PT Home Exercise Plan [id number]    Consulted and Agree with Plan of Care Patient;Family member/caregiver           Patient will  benefit from skilled therapeutic intervention in order to improve the following deficits and impairments:  Difficulty walking,Decreased endurance,Decreased strength,Postural dysfunction,Decreased mobility,Decreased balance,Improper body mechanics,Impaired perceived functional ability,Decreased activity tolerance  Visit Diagnosis: Difficulty in walking, not elsewhere classified  Muscle weakness (generalized)     Problem List Patient Active Problem List   Diagnosis Date Noted  . 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  Atrial fibrillation, chronic ([place]) 08/12/2020  . Thoracic spine fracture ([place]) 06/29/2020  . Hyperkalemia 10/23/2019  . Diarrhea 10/23/2019  . CHF (congestive heart failure) ([place]) 06/04/2019  . Acute systolic CHF (congestive heart failure) ([place]) 06/04/2019  . Sepsis ([place]) 05/06/2019  . Acute asthma exacerbation 05/06/2019  . Acute respiratory distress 05/06/2019  . Acute respiratory failure with hypoxia ([place]) 04/06/2019  . HCAP (healthcare-associated pneumonia) 04/06/2019  . Acute kidney failure ([place]) 02/27/2019  . Chronic combined systolic and diastolic CHF (congestive heart failure) ([place]) 02/27/2019  . CKD (chronic kidney disease), stage III ([place]) 05/16/2018  . Normochromic normocytic anemia 05/15/2018  . CAD (coronary artery disease) 12/17/2017  . Hyperlipidemia LDL goal <70 12/17/2017  . 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 Cardiomyopathy, ischemic   . Debility 10/04/2016  . Stage 3 chronic kidney disease ([place])   . Disorientated   . Anoxic brain injury ([place]) 09/15/2016  . Benign essential HTN   . OSA (obstructive sleep apnea)   . Transaminitis   . Chronic respiratory failure with hypoxia ([place])   . Fever   . NSVT (nonsustained ventricular tachycardia) ([place])   . Uncomplicated asthma   . Diabetes mellitus type 2 in obese ([place])   . Acute on chronic combined systolic and diastolic CHF (congestive heart failure) ([place])   . Tachypnea   . Hypokalemia   . Leukocytosis   . Hypoxemia   . Acute respiratory failure ([place])   . Cardiopulmonary arrest ([place]) 08/27/2016  . Class 2 obesity due to excess calories with body mass index (BMI) of 35.0 to 35.9 in adult 03/02/2014  . Essential hypertension, benign 03/02/2014    Izell Noel, PT, DPT 12/24/2020, 3:20 PM  [facility] [address] [place], [name], [address] Phone: [phone]   Fax:  [phone]  Name: Dean Steinhauser Sr. MRN: [id number] Date  of Birth: [birth year]

## 2020-12-27 NOTE — Telephone Encounter (Signed)
Attempted pt again Unable to leave a message. I did send an "SMS message" and left office number.

## 2020-12-27 NOTE — Telephone Encounter (Signed)
Attempted to reach pt again, phone just rings, no voicemail. After many rings it just hangs up.

## 2020-12-30 NOTE — Telephone Encounter (Signed)
Attempted again, unable to leave message. Again sent "sms message" with office number

## 2021-01-03 ENCOUNTER — Telehealth: Payer: Self-pay

## 2021-01-03 ENCOUNTER — Telehealth: Payer: Self-pay | Admitting: Internal Medicine

## 2021-01-03 ENCOUNTER — Ambulatory Visit (INDEPENDENT_AMBULATORY_CARE_PROVIDER_SITE_OTHER): Payer: Medicare Other

## 2021-01-03 DIAGNOSIS — Z95810 Presence of automatic (implantable) cardiac defibrillator: Secondary | ICD-10-CM

## 2021-01-03 DIAGNOSIS — I5022 Chronic systolic (congestive) heart failure: Secondary | ICD-10-CM

## 2021-01-03 NOTE — Telephone Encounter (Signed)
Remote ICM transmission received.  Attempted call to patient regarding ICM remote transmission and call would not go through.

## 2021-01-03 NOTE — Telephone Encounter (Signed)
Thanks for getting all that info - would advise he remain on 80 mg lasix BID  Dr Lemmie Evens

## 2021-01-03 NOTE — Progress Notes (Signed)
EPIC Encounter for ICM Monitoring  Patient Name: Dean Tsang Sr. is a 70 y.o. male Date: 01/03/2021 Primary Care Physican: [name], MD Primary Cardiologist:[place] Electrophysiologist:Camnitz 2/8/2022OfficeWeight: 214lbs  Time in AF0.0 hr/day (0.0%)(taking Eliquis)  Attempted call to patient and unable to reach.  Transmission reviewed.   OptivolThoracic impedancesuggesting possible ongoing fluid accumulation starting 10/09/2020.  Fluid index > threshold starting 10/23/2020.  Prescribed:  Furosemide80 mgTake 1 tablet (80 mg total) by mouth daily  Spironolactone 25 mg take 1 tablet daily  Eliquis 5 mg take 1 tablet twice a day  Labs: 11/02/2020 Creatinine 1.38, BUN 16, Potassium 4.1, Sodium 141, GFR 51-59 09/21/2020 Creatinine 1.35, BUN 21, Potassium 4.8, Sodium 136 GFR 53-61 06/30/2020 Creatinine2.10, BUN34, Potassium4.3, Sodium136, ILN79 06/29/2020 Creatinine2.10, BUN31, Potassium4.0, Sodium138, GFR29 06/02/2020 Creatinine 2.18, BUN 43, Potassium 3.8, Sodium 133, GFR 30-34 01/23/2020 Creatinine 1.42, BUN 17, Potassium 4.0, Sodium 143, GFR 50-57 12/11/2019 Creatinine 1.40, BUN 21, Potassium 4.3, Sodium 143, GFR 51-58 A complete set of results can be found in Results Review.  Recommendations:Unable to reach.    Follow-up plan: ICM clinic phone appointment on 01/31/2021  to recheck fluid levels. 91 day device clinic remote transmission6/172022.   EP/Cardiology Office Visits:01/31/2021 withDr [employer].  Recall 09/16/2021 with Dr Curt Bears.  Copy of ICM check sent to Dr.Camnitzand Dr Debara Pickett.  3 month ICM trend: 01/03/2021.    1 Year ICM trend:       Rosalene Billings, RN 01/03/2021 9:30 AM

## 2021-01-03 NOTE — Telephone Encounter (Signed)
Thanks .. continue current therapy.  Dr Lemmie Evens
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
## 2021-01-03 NOTE — Progress Notes (Signed)
Attempted to return patient call.  Provided phone number of [phone] and call went to voice mail.

## 2021-01-03 NOTE — Telephone Encounter (Signed)
-----   Message from Pixie Casino, MD sent at 01/03/2021  2:26 PM EDT ----- Device upload suggests possible fluid accumulation - please try to reach and determine if any CHF symptoms are noted.    Thanks.  Dr Lemmie Evens ----- Message ----- From: Rosalene Billings, RN Sent: 01/03/2021   2:10 PM EDT To: Pixie Casino, MD, Will Meredith Leeds, MD

## 2021-01-03 NOTE — Telephone Encounter (Signed)
Called patient regarding MD review of his note. He said his lasix was stopped during last hospitalization, once resumed was at 40mg  daily, then increased to 80mg . He has been on 80mg  BID for about 1 month (since message below)  He said a nurse told him to take 2 lasix for 3 days a few weeks ago d/t device transmission report. He cannot recall the of the nurse he spoke  Per chart review:  December 23, 2020    10:43 AM Shirlee More, RN routed this conversation to [name], MD . Stanton Kidney, RN   Shirlee More, RN    10:33 AM Note Patient called device clinic to follow up on elevated OptiVol levels. Transmission received on 03/18/ showed increased OptiVol levels. Patient states he was advised by RN to take" 3 extra fluid pills." Patient is followed by HF Clinic. Patient sent another transmission today still showing elevated fluid levels. Sending to Dr. Shonna Chock for review.       He had lab work done at PCP office a few weeks ago - this is not in [place] - he recalls his creatinine being around 1.8 (last in our system was 10/2020 - 1.38) -- called PCP office to obtain labs (will also be faxed)  12/10/20 labs with PCP BUN 18 Creatinine 1.94

## 2021-01-03 NOTE — Telephone Encounter (Signed)
Spoke with patient and discussed remote transmission.  See ICM note for details.

## 2021-01-03 NOTE — Telephone Encounter (Signed)
Spoke with patient regarding message below - explained a few nurses have been trying to reach him regarding his transmissions  He reports NO weight gain - is down to 197lbs He has no swelling and no changes in breathing  Patient is taking lasix 80mg  BID, spironolactone 25mg  QD, amiodarone 100mg  QD, carvedilol 12.5mg  BID, entresto 24-26mg  BID, eliquis 5mg  BID  Patient would like a call back from [place] to explain his device download  Patient has an appointment with Dr. Debara Pickett on 5/9  Will route to Dr. Debara Pickett (general cardiologist) & Dr. Curt Bears (EP) to review/advise  If sooner appointment needed, please advise

## 2021-01-05 NOTE — Telephone Encounter (Signed)
Left message for patient on name-verified VM that he should remain on current medications

## 2021-01-13 ENCOUNTER — Ambulatory Visit: Payer: Medicare Other | Admitting: Rehabilitative and Restorative Service Providers"

## 2021-01-13 ENCOUNTER — Encounter: Payer: Self-pay | Admitting: Rehabilitative and Restorative Service Providers"

## 2021-01-13 ENCOUNTER — Other Ambulatory Visit: Payer: Self-pay

## 2021-01-13 DIAGNOSIS — M6281 Muscle weakness (generalized): Secondary | ICD-10-CM

## 2021-01-13 DIAGNOSIS — R262 Difficulty in walking, not elsewhere classified: Secondary | ICD-10-CM | POA: Diagnosis not present

## 2021-01-13 NOTE — Therapy (Signed)
[place], [name], [address] Phone: [phone]   Fax:  [phone]  Physical Therapy Treatment  Patient Details  Name: Dean Dartt Sr. MRN: [id number] Date of Birth: Jun 06, 1951 Referring Provider (PT): Earnie Larsson, MD   Encounter Date: 01/13/2021   PT End of Session - 01/13/21 1329    Visit Number 2    Number of Visits 16    Date for PT Re-Evaluation 02/18/21    PT Start Time 0120    PT Stop Time 0202    PT Time Calculation (min) 42 min    Activity Tolerance Patient tolerated treatment well;No increased pain    Behavior During Therapy WFL for tasks assessed/performed           Past Medical History:  Diagnosis Date  . 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 Arthritis    "maybe in his spine" (09/05/2016)  . Asthma    pt. denies  . Cardiac arrest ([place]) 08/27/2016   REQUIRING CPR, SHOCK & MEDICATIONS  . CHF (congestive heart failure) ([facility])   . Coronary artery disease    90% ostRCA stenosis and total occlusion of dRCA with L-->R collaterals 2017  . Dysrhythmia   . Gout   . High cholesterol   . Hypertension   . Ischemic cardiomyopathy   . LBBB (left bundle branch block)   . OSA on CPAP    does not wear CPAP  . Type II diabetes mellitus ([place])     Past Surgical History:  Procedure Laterality Date  . CARDIAC CATHETERIZATION N/A 09/07/2016   Procedure: Left Heart Cath and Coronary Angiography;  Surgeon: Peter M Martinique, MD;  Location: [facility];  Service: Cardiovascular;  Laterality: N/A;  . COLONOSCOPY WITH PROPOFOL N/A 08/30/2018   Procedure: COLONOSCOPY WITH PROPOFOL;  Surgeon: Carol Ada, MD;  [facility];  Service: Endoscopy;  Laterality: N/A;  . EP IMPLANTABLE DEVICE N/A 10/10/2016   Procedure: ICD Implant;  Surgeon: Will Meredith Leeds, MD;  Location: [facility];  Service: Cardiovascular;  Laterality: N/A;  . LUMBAR  LAMINECTOMY/DECOMPRESSION MICRODISCECTOMY N/A 01/18/2018   Procedure: L2-3, L3-4, L-4-5 CENTRAL DECOMPRESSION;  Surgeon: [name], MD;  Location: [facility];  Service: Orthopedics;  Laterality: N/A;  . LUMBAR LAMINECTOMY/DECOMPRESSION MICRODISCECTOMY N/A 08/12/2020   Procedure: THORACIC ELEVEN LAMINECTOMY;  Surgeon: Earnie Larsson, MD;  Location: [facility];  Service: Neurosurgery;  Laterality: N/A;  . POLYPECTOMY  08/30/2018   Procedure: POLYPECTOMY;  Surgeon: Carol Ada, MD;  [facility];  Service: Endoscopy;;    There were no vitals filed for this visit.   Subjective Assessment - 01/13/21 1324    Subjective No pain. I am doing my exercises pretty good. I go up and down 14 steps 2x/day.    Currently in Pain? No/denies                             [facility] Adult PT Treatment/Exercise - 01/13/21 0001      Exercises   Exercises Knee/Hip;Ankle;Lumbar      Knee/Hip Exercises: Seated   Other Seated Knee/Hip Exercises LAQ x 10, windshield wiper x 10, march x 10,iso LAQ with ankle pump x 20, double heel riase x 20; double toe raise x 20; LE only w/c propulsion for hamstring strengthening followed by retropushing with PT resistance for extra quad strengthening 2 bouts forward/backward 4x57 ft; ball squeeze x 20 with 2-3 sec hold; ball  squeeze with bil LAQ x 20; seated hip/addct/flex combo over weight x 20 each; seated forward lean to neutral followed by lateral sideways lean to neutral x 15 each side for core strengthening; neutral forward trunk lean angled towards knees to neutral x 15                  PT Education - 01/13/21 1334    Education Details reviewed HEP; pt performed all correctly    Person(s) Educated Patient    Methods Explanation    Comprehension Verbalized understanding;Returned demonstration            PT Short Term Goals - 01/13/21 1333      PT SHORT TERM GOAL #1   Title Pt will be I and compliant with initial HEP.    Status On-going       PT SHORT TERM GOAL #2   Title Pt will decrease 5XSTS to <30 seconds.    Status On-going      PT SHORT TERM GOAL #3   Title Pt will increase gait distance during 6MWT to 300 ft    Status On-going             PT Long Term Goals - 12/24/20 1502      PT LONG TERM GOAL #1   Title Pt will be independent with long term HEP for continued strength and endurance training to maintain mobility.    Time 8    Period Weeks    Status New    Target Date 02/18/21      PT LONG TERM GOAL #2   Title Pt will decrease 5xSTS time to </= 15 seconds to demonstrate improvement in lower extremity power and strength for decreased fall risk.    Baseline 48 seconds    Time 8    Period Weeks    Status New    Target Date 02/18/21      PT LONG TERM GOAL #3   Title Pt will demonstrate at least 4+/5 lower extremity MMT.    Time 8    Period Weeks    Status New    Target Date 02/18/21      PT LONG TERM GOAL #4   Title Pt will increase gait distance during 6MWT to 1831 feet, per age related norms to decrease fall risk and initiate community mobility.    Baseline 63 feet    Time 8    Period Weeks    Status New    Target Date 02/18/21      PT LONG TERM GOAL #5   Title Pt will improve FOTO ability to at least 60% to demonstrate meaningful change and improvement in perceived level of functional ability.    Baseline 51% ability    Time 8    Period Weeks    Status New    Target Date 02/18/21                 Plan - 01/13/21 1354    Clinical Impression Statement Pt presents to PT with bil LE weakness and decreased endurance. he is able to perform all desired activities with varied rest breaks in between but is nonetheless able to perform. Focused on LE strengthening and endurance activities with visit. No c/o pain after tx. PT advised him he may be sore tomorrow or the next day so to be careful with transfersand walking as a result. All exercises performed bil.    Rehab Potential Good    PT  Frequency 2x / week    PT Duration 8 weeks    PT Treatment/Interventions ADLs/Self Care Home Management;Aquatic Therapy;Cryotherapy;Electrical Stimulation;Iontophoresis 4mg /ml Dexamethasone;Moist Heat;Gait training;Stair training;Functional mobility training;Therapeutic activities;Therapeutic exercise;Balance training;Neuromuscular re-education;Patient/family education;Manual techniques;Passive range of motion    PT Next Visit Plan assess LE MMT, consider a balance test; LE strengthening    Consulted and Agree with Plan of Care Patient           Patient will benefit from skilled therapeutic intervention in order to improve the following deficits and impairments:  Difficulty walking,Decreased endurance,Decreased strength,Postural dysfunction,Decreased mobility,Decreased balance,Improper body mechanics,Impaired perceived functional ability,Decreased activity tolerance  Visit Diagnosis: Difficulty in walking, not elsewhere classified  Muscle weakness (generalized)     Problem List Patient Active Problem List   Diagnosis Date Noted  . 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 Atrial fibrillation, chronic ([place]) 08/12/2020  . Thoracic spine fracture ([place]) 06/29/2020  . Hyperkalemia 10/23/2019  . Diarrhea 10/23/2019  . CHF (congestive heart failure) ([place]) 06/04/2019  . Acute systolic CHF (congestive heart failure) ([place]) 06/04/2019  . Sepsis ([place]) 05/06/2019  . Acute asthma exacerbation 05/06/2019  . Acute respiratory distress 05/06/2019  . Acute respiratory failure with hypoxia ([place]) 04/06/2019  . HCAP (healthcare-associated pneumonia) 04/06/2019  . Acute kidney failure ([place]) 02/27/2019  . Chronic combined systolic and diastolic CHF (congestive heart failure) ([place]) 02/27/2019  . CKD (chronic kidney disease), stage III ([place]) 05/16/2018  . Normochromic normocytic anemia 05/15/2018  . CAD (coronary artery disease) 12/17/2017  . Hyperlipidemia LDL goal <70  12/17/2017  . 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 Cardiomyopathy, ischemic   . Debility 10/04/2016  . Stage 3 chronic kidney disease ([place])   . Disorientated   . Anoxic brain injury ([place]) 09/15/2016  . Benign essential HTN   . OSA (obstructive sleep apnea)   . Transaminitis   . Chronic respiratory failure with hypoxia ([place])   . Fever   . NSVT (nonsustained ventricular tachycardia) ([place])   . Uncomplicated asthma   . Diabetes mellitus type 2 in obese ([place])   . Acute on chronic combined systolic and diastolic CHF (congestive heart failure) ([place])   . Tachypnea   . Hypokalemia   . Leukocytosis   . Hypoxemia   . Acute respiratory failure ([place])   . Cardiopulmonary arrest ([place]) 08/27/2016  . Class 2 obesity due to excess calories with body mass index (BMI) of 35.0 to 35.9 in adult 03/02/2014  . Essential hypertension, benign 03/02/2014    America Brown, PT 01/13/2021, 2:04 PM  [facility] [address] [place], [name], [address] Phone: [phone]   Fax:  [phone]  Name: Dean Schappert Sr. MRN: [id number] Date of Birth: [birth year]

## 2021-01-18 ENCOUNTER — Other Ambulatory Visit: Payer: Self-pay

## 2021-01-18 ENCOUNTER — Encounter: Payer: Self-pay | Admitting: Physical Therapy

## 2021-01-18 ENCOUNTER — Ambulatory Visit: Payer: Medicare Other | Admitting: Physical Therapy

## 2021-01-18 DIAGNOSIS — M6281 Muscle weakness (generalized): Secondary | ICD-10-CM

## 2021-01-18 DIAGNOSIS — R262 Difficulty in walking, not elsewhere classified: Secondary | ICD-10-CM

## 2021-01-18 NOTE — Therapy (Signed)
[place], [name], [address] Phone: [phone]   Fax:  [phone]  Physical Therapy Treatment  Patient Details  Name: Dean Kundinger Sr. MRN: [id number] Date of Birth: 07-02-1951 Referring Provider (PT): Earnie Larsson, MD   Encounter Date: 01/18/2021   PT End of Session - 01/18/21 1326    Visit Number 3    Number of Visits 16    Date for PT Re-Evaluation 02/18/21    Authorization Time Period FOTO visit 6, 10; KX Modifier Visit #15    PT Start Time 1330    PT Stop Time 1415    PT Time Calculation (min) 45 min    Equipment Utilized During Treatment Gait belt    Activity Tolerance Patient tolerated treatment well;No increased pain    Behavior During Therapy WFL for tasks assessed/performed           Past Medical History:  Diagnosis Date  . 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 Arthritis    "maybe in his spine" (09/05/2016)  . Asthma    pt. denies  . Cardiac arrest ([place]) 08/27/2016   REQUIRING CPR, SHOCK & MEDICATIONS  . CHF (congestive heart failure) ([place])   . Coronary artery disease    90% ostRCA stenosis and total occlusion of dRCA with L-->R collaterals 2017  . Dysrhythmia   . Gout   . High cholesterol   . Hypertension   . Ischemic cardiomyopathy   . LBBB (left bundle branch block)   . OSA on CPAP    does not wear CPAP  . Type II diabetes mellitus ([place])     Past Surgical History:  Procedure Laterality Date  . CARDIAC CATHETERIZATION N/A 09/07/2016   Procedure: Left Heart Cath and Coronary Angiography;  Surgeon: Peter M Martinique, MD;  Location: [facility];  Service: Cardiovascular;  Laterality: N/A;  . COLONOSCOPY WITH PROPOFOL N/A 08/30/2018   Procedure: COLONOSCOPY WITH PROPOFOL;  Surgeon: Carol Ada, MD;  [facility];  Service: Endoscopy;  Laterality: N/A;  . EP IMPLANTABLE DEVICE N/A 10/10/2016   Procedure: ICD Implant;  Surgeon: Will  Meredith Leeds, MD;  Location: [facility];  Service: Cardiovascular;  Laterality: N/A;  . LUMBAR LAMINECTOMY/DECOMPRESSION MICRODISCECTOMY N/A 01/18/2018   Procedure: L2-3, L3-4, L-4-5 CENTRAL DECOMPRESSION;  Surgeon: [name], MD;  Location: [facility];  Service: Orthopedics;  Laterality: N/A;  . LUMBAR LAMINECTOMY/DECOMPRESSION MICRODISCECTOMY N/A 08/12/2020   Procedure: THORACIC ELEVEN LAMINECTOMY;  Surgeon: Earnie Larsson, MD;  Location: [facility];  Service: Neurosurgery;  Laterality: N/A;  . POLYPECTOMY  08/30/2018   Procedure: POLYPECTOMY;  Surgeon: Carol Ada, MD;  [facility];  Service: Endoscopy;;    There were no vitals filed for this visit.   Subjective Assessment - 01/18/21 1334    Subjective Pt reports he was sore after last session. Nothing new or different to report.    Limitations Lifting;Standing;Walking;House hold activities    Patient Stated Goals I want to be able to walk without my walker.    Currently in Pain? No/denies              [facility] PT Assessment - 01/18/21 0001      ROM / Strength   AROM / PROM / Strength Strength      Strength   Overall Strength Comments Bilat knees grossly 4/5; bilat hip flexion 3+/5, abduction 4-/5      High Level Balance   High Level Balance Comments Feet together 30  sec with forward flexed posture; eyes closed feet together 1 sec                         [facility] Adult PT Treatment/Exercise - 01/18/21 0001      Lumbar Exercises: Stretches   Active Hamstring Stretch Right;Left;20 seconds    Other Lumbar Stretch Exercise pball forward flexion x20 sec, side flexion x20 sec      Lumbar Exercises: Aerobic   Nustep L5 x 5 min LEs only      Lumbar Exercises: Seated   Long Arc Quad on Chair Strengthening;Both;2 sets;10 reps    LAQ on Chair Limitations red tband    Other Seated Lumbar Exercises hip flexion with red tband 2x10      Lumbar Exercises: Supine   Pelvic Tilt 20 reps    Bridge Compliant;20  reps    Other Supine Lumbar Exercises Knee flexion with pball 2x10      Lumbar Exercises: Sidelying   Clam Both;20 reps   red tband     Knee/Hip Exercises: Standing   Other Standing Knee Exercises Glute set 2x10                    PT Short Term Goals - 01/13/21 1333      PT SHORT TERM GOAL #1   Title Pt will be I and compliant with initial HEP.    Status On-going      PT SHORT TERM GOAL #2   Title Pt will decrease 5XSTS to <30 seconds.    Status On-going      PT SHORT TERM GOAL #3   Title Pt will increase gait distance during 6MWT to 300 ft    Status On-going             PT Long Term Goals - 12/24/20 1502      PT LONG TERM GOAL #1   Title Pt will be independent with long term HEP for continued strength and endurance training to maintain mobility.    Time 8    Period Weeks    Status New    Target Date 02/18/21      PT LONG TERM GOAL #2   Title Pt will decrease 5xSTS time to </= 15 seconds to demonstrate improvement in lower extremity power and strength for decreased fall risk.    Baseline 48 seconds    Time 8    Period Weeks    Status New    Target Date 02/18/21      PT LONG TERM GOAL #3   Title Pt will demonstrate at least 4+/5 lower extremity MMT.    Time 8    Period Weeks    Status New    Target Date 02/18/21      PT LONG TERM GOAL #4   Title Pt will increase gait distance during 6MWT to 1831 feet, per age related norms to decrease fall risk and initiate community mobility.    Baseline 63 feet    Time 8    Period Weeks    Status New    Target Date 02/18/21      PT LONG TERM GOAL #5   Title Pt will improve FOTO ability to at least 60% to demonstrate meaningful change and improvement in perceived level of functional ability.    Baseline 51% ability    Time 8    Period Weeks    Status New    Target Date 02/18/21  Plan - 01/18/21 1414    Clinical Impression Statement Focused on progressing and modifying pt's HEP.  Continued LE strengthening and endurance activities. Pt with decreased standing balance; unable to maintain standing with eyes closed feet together.    Personal Factors and Comorbidities Age;Fitness;Time since onset of injury/illness/exacerbation;Comorbidity 3+    Comorbidities HLD, HTN, arthritis, DM, CHF,    Examination-Activity Limitations Bathing;Locomotion Level;Transfers;Bend;Lift;Stand;Stairs;Squat    Examination-Participation Restrictions Community Activity;Interpersonal Relationship;Meal Prep    Stability/Clinical Decision Making Evolving/Moderate complexity    Rehab Potential Good    PT Frequency 2x / week    PT Duration 8 weeks    PT Treatment/Interventions ADLs/Self Care Home Management;Aquatic Therapy;Cryotherapy;Electrical Stimulation;Iontophoresis 4mg /ml Dexamethasone;Moist Heat;Gait training;Stair training;Functional mobility training;Therapeutic activities;Therapeutic exercise;Balance training;Neuromuscular re-education;Patient/family education;Manual techniques;Passive range of motion    PT Next Visit Plan Assess response to HEP. LE strengthening and endurance. Continue standing exercises and balance as pt tolerates    PT Home Exercise Plan [id number]    Consulted and Agree with Plan of Care Patient           Patient will benefit from skilled therapeutic intervention in order to improve the following deficits and impairments:  Difficulty walking,Decreased endurance,Decreased strength,Postural dysfunction,Decreased mobility,Decreased balance,Improper body mechanics,Impaired perceived functional ability,Decreased activity tolerance  Visit Diagnosis: No diagnosis found.     Problem List Patient Active Problem List   Diagnosis Date Noted  . 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 Atrial fibrillation, chronic ([place]) 08/12/2020  . Thoracic spine fracture ([place]) 06/29/2020  . Hyperkalemia 10/23/2019  . Diarrhea 10/23/2019  . CHF (congestive heart  failure) ([place]) 06/04/2019  . Acute systolic CHF (congestive heart failure) ([place]) 06/04/2019  . Sepsis ([place]) 05/06/2019  . Acute asthma exacerbation 05/06/2019  . Acute respiratory distress 05/06/2019  . Acute respiratory failure with hypoxia ([place]) 04/06/2019  . HCAP (healthcare-associated pneumonia) 04/06/2019  . Acute kidney failure ([place]) 02/27/2019  . Chronic combined systolic and diastolic CHF (congestive heart failure) ([place]) 02/27/2019  . CKD (chronic kidney disease), stage III ([place]) 05/16/2018  . Normochromic normocytic anemia 05/15/2018  . CAD (coronary artery disease) 12/17/2017  . Hyperlipidemia LDL goal <70 12/17/2017  . 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 Cardiomyopathy, ischemic   . Debility 10/04/2016  . Stage 3 chronic kidney disease ([place])   . Disorientated   . Anoxic brain injury ([place]) 09/15/2016  . Benign essential HTN   . OSA (obstructive sleep apnea)   . Transaminitis   . Chronic respiratory failure with hypoxia ([place])   . Fever   . NSVT (nonsustained ventricular tachycardia) ([place])   . Uncomplicated asthma   . Diabetes mellitus type 2 in obese ([place])   . Acute on chronic combined systolic and diastolic CHF (congestive heart failure) ([place])   . Tachypnea   . Hypokalemia   . Leukocytosis   . Hypoxemia   . Acute respiratory failure ([place])   . Cardiopulmonary arrest ([place]) 08/27/2016  . Class 2 obesity due to excess calories with body mass index (BMI) of 35.0 to 35.9 in adult 03/02/2014  . Essential hypertension, benign 03/02/2014    [facility] [address] PT, DPT 01/18/2021, 2:16 PM  [facility] [address] [place], [name], [address] Phone: [phone]   Fax:  [phone]  Name: Dean Captain Sr. MRN: [id number] Date of Birth: 02-Nov-1950

## 2021-01-20 ENCOUNTER — Ambulatory Visit: Payer: Medicare Other | Admitting: Physical Therapy

## 2021-01-25 ENCOUNTER — Ambulatory Visit: Payer: Medicare Other | Attending: Neurosurgery | Admitting: Physical Therapy

## 2021-01-25 ENCOUNTER — Other Ambulatory Visit: Payer: Self-pay

## 2021-01-25 DIAGNOSIS — M6281 Muscle weakness (generalized): Secondary | ICD-10-CM | POA: Diagnosis present

## 2021-01-25 DIAGNOSIS — R262 Difficulty in walking, not elsewhere classified: Secondary | ICD-10-CM | POA: Insufficient documentation

## 2021-01-25 NOTE — Therapy (Signed)
[place], [name], [address] Phone: [phone]   Fax:  [phone]  Physical Therapy Treatment  Patient Details  Name: Dean Rathgeber Sr. MRN: [id number] Date of Birth: [birth year] Referring Provider (PT): Earnie Larsson, MD   Encounter Date: 01/25/2021   PT End of Session - 01/25/21 1328    Visit Number 4    Number of Visits 16    Date for PT Re-Evaluation 02/18/21    Authorization Time Period FOTO visit 6, 10; KX Modifier Visit #15    PT Start Time 1330    PT Stop Time 1415    PT Time Calculation (min) 45 min    Equipment Utilized During Treatment Gait belt    Activity Tolerance Patient tolerated treatment well;No increased pain    Behavior During Therapy WFL for tasks assessed/performed           Past Medical History:  Diagnosis Date  . 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 Arthritis    "maybe in his spine" (09/05/2016)  . Asthma    pt. denies  . Cardiac arrest ([place]) 08/27/2016   REQUIRING CPR, SHOCK & MEDICATIONS  . CHF (congestive heart failure) ([place])   . Coronary artery disease    90% ostRCA stenosis and total occlusion of dRCA with L-->R collaterals 2017  . Dysrhythmia   . Gout   . High cholesterol   . Hypertension   . Ischemic cardiomyopathy   . LBBB (left bundle branch block)   . OSA on CPAP    does not wear CPAP  . Type II diabetes mellitus ([place])     Past Surgical History:  Procedure Laterality Date  . CARDIAC CATHETERIZATION N/A 09/07/2016   Procedure: Left Heart Cath and Coronary Angiography;  Surgeon: Peter M Martinique, MD;  Location: [facility];  Service: Cardiovascular;  Laterality: N/A;  . COLONOSCOPY WITH PROPOFOL N/A 08/30/2018   Procedure: COLONOSCOPY WITH PROPOFOL;  Surgeon: Carol Ada, MD;  [facility];  Service: Endoscopy;  Laterality: N/A;  . EP IMPLANTABLE DEVICE N/A 10/10/2016   Procedure: ICD Implant;  Surgeon: Will  Meredith Leeds, MD;  Location: [facility];  Service: Cardiovascular;  Laterality: N/A;  . LUMBAR LAMINECTOMY/DECOMPRESSION MICRODISCECTOMY N/A 01/18/2018   Procedure: L2-3, L3-4, L-4-5 CENTRAL DECOMPRESSION;  Surgeon: [name], MD;  Location: [facility];  Service: Orthopedics;  Laterality: N/A;  . LUMBAR LAMINECTOMY/DECOMPRESSION MICRODISCECTOMY N/A 08/12/2020   Procedure: THORACIC ELEVEN LAMINECTOMY;  Surgeon: Earnie Larsson, MD;  Location: [facility];  Service: Neurosurgery;  Laterality: N/A;  . POLYPECTOMY  08/30/2018   Procedure: POLYPECTOMY;  Surgeon: Carol Ada, MD;  [facility];  Service: Endoscopy;;    There were no vitals filed for this visit.   Subjective Assessment - 01/25/21 1332    Subjective Pt states he woke up and his legs felt tired. Pt reports some soreness after last session. Pt reports continued numbness in his feet (about the same).    Limitations Lifting;Standing;Walking;House hold activities    Patient Stated Goals I want to be able to walk without my walker.    Currently in Pain? No/denies                             [facility] Adult PT Treatment/Exercise - 01/25/21 0001      Lumbar Exercises: Aerobic   Nustep L5 x 5 min LEs only      Lumbar Exercises: Standing  Other Standing Lumbar Exercises weight shift L<>R 2x10    Other Standing Lumbar Exercises weightshift forward/backward 2x10 bilat      Lumbar Exercises: Supine   Ab Set 10 reps;2 seconds    Pelvic Tilt 20 reps    Pelvic Tilt Limitations cues to not use neck/shoulders    Bridge Compliant;20 reps      Lumbar Exercises: Sidelying   Clam Both;20 reps    Clam Limitations yellow tband      Knee/Hip Exercises: Standing   Hip Abduction Stengthening;Both;2 sets;10 reps    Other Standing Knee Exercises Glute set 2x10    Other Standing Knee Exercises Feet together EO x30 sec with head turns/nods 2x10                    PT Short Term Goals - 01/13/21 1333      PT  SHORT TERM GOAL #1   Title Pt will be I and compliant with initial HEP.    Status On-going      PT SHORT TERM GOAL #2   Title Pt will decrease 5XSTS to <30 seconds.    Status On-going      PT SHORT TERM GOAL #3   Title Pt will increase gait distance during 6MWT to 300 ft    Status On-going             PT Long Term Goals - 12/24/20 1502      PT LONG TERM GOAL #1   Title Pt will be independent with long term HEP for continued strength and endurance training to maintain mobility.    Time 8    Period Weeks    Status New    Target Date 02/18/21      PT LONG TERM GOAL #2   Title Pt will decrease 5xSTS time to </= 15 seconds to demonstrate improvement in lower extremity power and strength for decreased fall risk.    Baseline 48 seconds    Time 8    Period Weeks    Status New    Target Date 02/18/21      PT LONG TERM GOAL #3   Title Pt will demonstrate at least 4+/5 lower extremity MMT.    Time 8    Period Weeks    Status New    Target Date 02/18/21      PT LONG TERM GOAL #4   Title Pt will increase gait distance during 6MWT to 1831 feet, per age related norms to decrease fall risk and initiate community mobility.    Baseline 63 feet    Time 8    Period Weeks    Status New    Target Date 02/18/21      PT LONG TERM GOAL #5   Title Pt will improve FOTO ability to at least 60% to demonstrate meaningful change and improvement in perceived level of functional ability.    Baseline 51% ability    Time 8    Period Weeks    Status New    Target Date 02/18/21                 Plan - 01/25/21 1335    Clinical Impression Statement Focused on progressing pt's HEP. Initiated increased standing exercises to improve strength, endurance, and balance.    Personal Factors and Comorbidities Age;Fitness;Time since onset of injury/illness/exacerbation;Comorbidity 3+    Comorbidities HLD, HTN, arthritis, DM, CHF,    Examination-Activity Limitations Bathing;Locomotion  Level;Transfers;Bend;Lift;Stand;Stairs;Squat    Examination-Participation Restrictions  Community Activity;Interpersonal Relationship;Meal Prep    Stability/Clinical Decision Making Evolving/Moderate complexity    Rehab Potential Good    PT Frequency 2x / week    PT Duration 8 weeks    PT Treatment/Interventions ADLs/Self Care Home Management;Aquatic Therapy;Cryotherapy;Electrical Stimulation;Iontophoresis 4mg /ml Dexamethasone;Moist Heat;Gait training;Stair training;Functional mobility training;Therapeutic activities;Therapeutic exercise;Balance training;Neuromuscular re-education;Patient/family education;Manual techniques;Passive range of motion    PT Next Visit Plan Assess response to HEP. LE strengthening and endurance. Continue standing exercises and balance as pt tolerates    PT Home Exercise Plan [id number]    Consulted and Agree with Plan of Care Patient           Patient will benefit from skilled therapeutic intervention in order to improve the following deficits and impairments:  Difficulty walking,Decreased endurance,Decreased strength,Postural dysfunction,Decreased mobility,Decreased balance,Improper body mechanics,Impaired perceived functional ability,Decreased activity tolerance  Visit Diagnosis: Difficulty in walking, not elsewhere classified  Muscle weakness (generalized)     Problem List Patient Active Problem List   Diagnosis Date Noted  . 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 Atrial fibrillation, chronic ([place]) 08/12/2020  . Thoracic spine fracture ([place]) 06/29/2020  . Hyperkalemia 10/23/2019  . Diarrhea 10/23/2019  . CHF (congestive heart failure) ([place]) 06/04/2019  . Acute systolic CHF (congestive heart failure) ([place]) 06/04/2019  . Sepsis ([place]) 05/06/2019  . Acute asthma exacerbation 05/06/2019  . Acute respiratory distress 05/06/2019  . Acute respiratory failure with hypoxia ([place]) 04/06/2019  . HCAP (healthcare-associated pneumonia)  04/06/2019  . Acute kidney failure ([place]) 02/27/2019  . Chronic combined systolic and diastolic CHF (congestive heart failure) ([place]) 02/27/2019  . CKD (chronic kidney disease), stage III ([place]) 05/16/2018  . Normochromic normocytic anemia 05/15/2018  . CAD (coronary artery disease) 12/17/2017  . Hyperlipidemia LDL goal <70 12/17/2017  . 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 Cardiomyopathy, ischemic   . Debility 10/04/2016  . Stage 3 chronic kidney disease ([place])   . Disorientated   . Anoxic brain injury ([place]) 09/15/2016  . Benign essential HTN   . OSA (obstructive sleep apnea)   . Transaminitis   . Chronic respiratory failure with hypoxia ([place])   . Fever   . NSVT (nonsustained ventricular tachycardia) ([place])   . Uncomplicated asthma   . Diabetes mellitus type 2 in obese ([place])   . Acute on chronic combined systolic and diastolic CHF (congestive heart failure) ([place])   . Tachypnea   . Hypokalemia   . Leukocytosis   . Hypoxemia   . Acute respiratory failure ([place])   . Cardiopulmonary arrest ([place]) 08/27/2016  . Class 2 obesity due to excess calories with body mass index (BMI) of 35.0 to 35.9 in adult 03/02/2014  . Essential hypertension, benign 03/02/2014    [facility] [address] PT, DPT 01/25/2021, 2:21 PM  [facility] [address] [place], [name], [address] Phone: [phone]   Fax:  [phone]  Name: Dean Santone Sr. MRN: [id number] Date of Birth: Aug 12, 1951

## 2021-01-27 ENCOUNTER — Ambulatory Visit: Payer: Medicare Other | Admitting: Physical Therapy

## 2021-01-27 ENCOUNTER — Other Ambulatory Visit: Payer: Self-pay

## 2021-01-27 DIAGNOSIS — M6281 Muscle weakness (generalized): Secondary | ICD-10-CM

## 2021-01-27 DIAGNOSIS — R262 Difficulty in walking, not elsewhere classified: Secondary | ICD-10-CM | POA: Diagnosis not present

## 2021-01-27 NOTE — Therapy (Signed)
[name], [name], [address] Phone: [phone]   Fax:  [phone]  Physical Therapy Treatment  Patient Details  Name: Dean Gabler Sr. MRN: [id number] Date of Birth: 1951-09-14 Referring Provider (PT): Earnie Larsson, MD   Encounter Date: 01/27/2021   PT End of Session - 01/27/21 1327    Visit Number 5    Number of Visits 16    Date for PT Re-Evaluation 02/18/21    Authorization Time Period FOTO visit 6, 10; [place] Modifier Visit #15    PT Start Time 1330    PT Stop Time 1415    PT Time Calculation (min) 45 min    Equipment Utilized During Treatment Gait belt    Activity Tolerance Patient tolerated treatment well;No increased pain    Behavior During Therapy WFL for tasks assessed/performed           Past Medical History:  Diagnosis Date  . 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 Arthritis    "maybe in his spine" (09/05/2016)  . Asthma    pt. denies  . Cardiac arrest ([place]) 08/27/2016   REQUIRING CPR, SHOCK & MEDICATIONS  . CHF (congestive heart failure) ([place])   . Coronary artery disease    90% ostRCA stenosis and total occlusion of dRCA with L-->R collaterals 2017  . Dysrhythmia   . Gout   . High cholesterol   . Hypertension   . Ischemic cardiomyopathy   . LBBB (left bundle branch block)   . OSA on CPAP    does not wear CPAP  . Type II diabetes mellitus ([place])     Past Surgical History:  Procedure Laterality Date  . CARDIAC CATHETERIZATION N/A 09/07/2016   Procedure: Left Heart Cath and Coronary Angiography;  Surgeon: Peter M Martinique, MD;  Location: [facility];  Service: Cardiovascular;  Laterality: N/A;  . COLONOSCOPY WITH PROPOFOL N/A 08/30/2018   Procedure: COLONOSCOPY WITH PROPOFOL;  Surgeon: Carol Ada, MD;  [facility];  Service: Endoscopy;  Laterality: N/A;  . EP IMPLANTABLE DEVICE N/A 10/10/2016   Procedure: ICD Implant;  Surgeon: Will  Meredith Leeds, MD;  Location: [facility];  Service: Cardiovascular;  Laterality: N/A;  . LUMBAR LAMINECTOMY/DECOMPRESSION MICRODISCECTOMY N/A 01/18/2018   Procedure: L2-3, L3-4, L-4-5 CENTRAL DECOMPRESSION;  Surgeon: [name], MD;  Location: [facility];  Service: Orthopedics;  Laterality: N/A;  . LUMBAR LAMINECTOMY/DECOMPRESSION MICRODISCECTOMY N/A 08/12/2020   Procedure: THORACIC ELEVEN LAMINECTOMY;  Surgeon: Earnie Larsson, MD;  Location: [facility];  Service: Neurosurgery;  Laterality: N/A;  . POLYPECTOMY  08/30/2018   Procedure: POLYPECTOMY;  Surgeon: Carol Ada, MD;  [facility];  Service: Endoscopy;;    There were no vitals filed for this visit.   Subjective Assessment - 01/27/21 1336    Subjective Pt reports he has been doing exercises some but was limited due to return to work. He started taking the medication to address his nerve numbness but reports not feeling a difference yet.    Limitations Lifting;Standing;Walking;House hold activities    Patient Stated Goals I want to be able to walk without my walker.                             [facility] Adult PT Treatment/Exercise - 01/27/21 0001      Lumbar Exercises: Aerobic   Nustep L6 x 5 min LEs & UEs      Lumbar Exercises: Standing  Heel Raises 20 reps    Other Standing Lumbar Exercises weight shift L<>R 2x10    Other Standing Lumbar Exercises weightshift forward/backward 2x10 bilat      Knee/Hip Exercises: Standing   Hip Abduction Stengthening;Both;2 sets;10 reps    Abduction Limitations red tband    Hip Extension Stengthening;Both;2 sets;10 reps    Extension Limitations red tband    Gait Training 2x25' // bars holding 2x25' no hand hold (P)     Other Standing Knee Exercises Mini squat 2x10                    PT Short Term Goals - 01/13/21 1333      PT SHORT TERM GOAL #1   Title Pt will be I and compliant with initial HEP.    Status On-going      PT SHORT TERM GOAL #2   Title  Pt will decrease 5XSTS to <30 seconds.    Status On-going      PT SHORT TERM GOAL #3   Title Pt will increase gait distance during 6MWT to 300 ft    Status On-going             PT Long Term Goals - 12/24/20 1502      PT LONG TERM GOAL #1   Title Pt will be independent with long term HEP for continued strength and endurance training to maintain mobility.    Time 8    Period Weeks    Status New    Target Date 02/18/21      PT LONG TERM GOAL #2   Title Pt will decrease 5xSTS time to </= 15 seconds to demonstrate improvement in lower extremity power and strength for decreased fall risk.    Baseline 48 seconds    Time 8    Period Weeks    Status New    Target Date 02/18/21      PT LONG TERM GOAL #3   Title Pt will demonstrate at least 4+/5 lower extremity MMT.    Time 8    Period Weeks    Status New    Target Date 02/18/21      PT LONG TERM GOAL #4   Title Pt will increase gait distance during 6MWT to 1831 feet, per age related norms to decrease fall risk and initiate community mobility.    Baseline 63 feet    Time 8    Period Weeks    Status New    Target Date 02/18/21      PT LONG TERM GOAL #5   Title Pt will improve FOTO ability to at least 60% to demonstrate meaningful change and improvement in perceived level of functional ability.    Baseline 51% ability    Time 8    Period Weeks    Status New    Target Date 02/18/21                 Plan - 01/27/21 1355    Clinical Impression Statement Treatment focused on increasing standing tolerance and exercises. Continued pre gait activities and balance to work on weight shifting and increasing single leg weight bearing. Initiated gait training in // bars.    Personal Factors and Comorbidities Age;Fitness;Time since onset of injury/illness/exacerbation;Comorbidity 3+    Comorbidities HLD, HTN, arthritis, DM, CHF,    Examination-Activity Limitations Bathing;Locomotion Level;Transfers;Bend;Lift;Stand;Stairs;Squat     Examination-Participation Restrictions Community Activity;Interpersonal Relationship;Meal Prep    Stability/Clinical Decision Making Evolving/Moderate complexity  Rehab Potential Good    PT Frequency 2x / week    PT Duration 8 weeks    PT Treatment/Interventions ADLs/Self Care Home Management;Aquatic Therapy;Cryotherapy;Electrical Stimulation;Iontophoresis 4mg /ml Dexamethasone;Moist Heat;Gait training;Stair training;Functional mobility training;Therapeutic activities;Therapeutic exercise;Balance training;Neuromuscular re-education;Patient/family education;Manual techniques;Passive range of motion    PT Next Visit Plan Assess response to HEP. LE strengthening and endurance. Continue standing exercises and balance as pt tolerates    PT Home Exercise Plan [id number]    Consulted and Agree with Plan of Care Patient           Patient will benefit from skilled therapeutic intervention in order to improve the following deficits and impairments:  Difficulty walking,Decreased endurance,Decreased strength,Postural dysfunction,Decreased mobility,Decreased balance,Improper body mechanics,Impaired perceived functional ability,Decreased activity tolerance  Visit Diagnosis: Difficulty in walking, not elsewhere classified  Muscle weakness (generalized)     Problem List Patient Active Problem List   Diagnosis Date Noted  . 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 Atrial fibrillation, chronic ([place]) 08/12/2020  . Thoracic spine fracture ([place]) 06/29/2020  . Hyperkalemia 10/23/2019  . Diarrhea 10/23/2019  . CHF (congestive heart failure) ([place]) 06/04/2019  . Acute systolic CHF (congestive heart failure) ([place]) 06/04/2019  . Sepsis ([place]) 05/06/2019  . Acute asthma exacerbation 05/06/2019  . Acute respiratory distress 05/06/2019  . Acute respiratory failure with hypoxia ([place]) 04/06/2019  . HCAP (healthcare-associated pneumonia) 04/06/2019  . Acute kidney failure ([place])  02/27/2019  . Chronic combined systolic and diastolic CHF (congestive heart failure) ([place]) 02/27/2019  . CKD (chronic kidney disease), stage III ([place]) 05/16/2018  . Normochromic normocytic anemia 05/15/2018  . CAD (coronary artery disease) 12/17/2017  . Hyperlipidemia LDL goal <70 12/17/2017  . 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 Cardiomyopathy, ischemic   . Debility 10/04/2016  . Stage 3 chronic kidney disease ([place])   . Disorientated   . Anoxic brain injury ([place]) 09/15/2016  . Benign essential HTN   . OSA (obstructive sleep apnea)   . Transaminitis   . Chronic respiratory failure with hypoxia ([place])   . Fever   . NSVT (nonsustained ventricular tachycardia) ([place])   . Uncomplicated asthma   . Diabetes mellitus type 2 in obese ([place])   . Acute on chronic combined systolic and diastolic CHF (congestive heart failure) ([place])   . Tachypnea   . Hypokalemia   . Leukocytosis   . Hypoxemia   . Acute respiratory failure ([place])   . Cardiopulmonary arrest ([place]) 08/27/2016  . Class 2 obesity due to excess calories with body mass index (BMI) of 35.0 to 35.9 in adult 03/02/2014  . Essential hypertension, benign 03/02/2014    [facility] [address] PT, DPT 01/27/2021, 2:28 PM  [facility] [address] [place], [name], [address] Phone: [phone]   Fax:  [phone]  Name: Dean Bohlman Sr. MRN: [id number] Date of Birth: 09-Mar-1951

## 2021-01-31 ENCOUNTER — Ambulatory Visit (INDEPENDENT_AMBULATORY_CARE_PROVIDER_SITE_OTHER): Payer: Medicare Other

## 2021-01-31 ENCOUNTER — Ambulatory Visit: Payer: Medicare Other | Admitting: Internal Medicine

## 2021-01-31 ENCOUNTER — Telehealth: Payer: Self-pay | Admitting: Internal Medicine

## 2021-01-31 DIAGNOSIS — Z95810 Presence of automatic (implantable) cardiac defibrillator: Secondary | ICD-10-CM

## 2021-01-31 DIAGNOSIS — I5022 Chronic systolic (congestive) heart failure: Secondary | ICD-10-CM

## 2021-01-31 NOTE — Progress Notes (Addendum)
EPIC Encounter for ICM Monitoring  Patient Name: Dean Schliep Sr. is a 70 y.o. male Date: 01/31/2021 Primary Care Physican: [name], MD Primary Cardiologist:Hilty Electrophysiologist:Camnitz 4/11/2022Weight:  197lbs   Time in AF<0.1 hr/day (<0.1%)(taking Eliquis)  Transmission reviewed.   OptivolThoracic impedanceimproved since 01/24/2021 but still suggesting possible ongoing fluid accumulation starting 10/09/2020.Fluid index > threshold starting 10/23/2020.  Prescribed:  Furosemide80 mgTake1 tablet (80 mg total) daily.  Spironolactone 25 mg take 1 tablet daily  Eliquis 5 mg take 1 tablet twice a day  Labs: 12/10/2020 Creatinine 1.94, BUN 18, Potassium 4.3, Sodium 141, GFR 39 11/02/2020 Creatinine 1.38, BUN 16, Potassium 4.1, Sodium 141, GFR 51-59 09/21/2020 Creatinine 1.35, BUN 21, Potassium 4.8, Sodium 136 GFR 53-61 06/30/2020 Creatinine2.10, BUN34, Potassium4.3, Sodium136, GFR31 06/29/2020 Creatinine2.10, BUN31, Potassium4.0, Sodium138, GFR29 06/02/2020 Creatinine 2.18, BUN 43, Potassium 3.8, Sodium 133, GFR 30-34 01/23/2020 Creatinine 1.42, BUN 17, Potassium 4.0, Sodium 143, GFR 50-57 12/11/2019 Creatinine 1.40, BUN 21, Potassium 4.3, Sodium 143, GFR 51-58 A complete set of results can be found in Results Review.  Recommendations:No changes.    Follow-up plan: ICM clinic phone appointment on 03/14/2021  to recheck fluid levels. 91 day device clinic remote transmission6/17/2022.   EP/Cardiology Office Visits:01/31/2021 withDrHilty. Recall 09/16/2021 with Dr Curt Bears.  Copy of ICM check sent to Dr.Camnitzand Dr Debara Pickett for FYI since patient has OV appointment today, 01/31/2021.  3 month ICM trend: 01/31/2021.    1 Year ICM trend:       Rosalene Billings, RN 01/31/2021 9:24 AM

## 2021-01-31 NOTE — Telephone Encounter (Signed)
Attempted to call patient at request of Hilty MD Patient was a no-show for 01/31/21 visit His device downloads have showed possible fluid accumulation since Jan 2022  Unable to reach patient - VM is full

## 2021-02-01 NOTE — Progress Notes (Signed)
Spoke with patient.  He reports he is feeling well and denies any fluid symptoms.  Transmission reviewed. He missed his office appointment with Dr Debara Pickett 5/9 and rescheduled with Almyra Deforest, PA on 5/13.  Reviewed date and time of office appointment.  He reports taking Furosemide 80 mg 1 tablet daily.  Advised to call if he experiences any fluid symptoms.

## 2021-02-03 ENCOUNTER — Other Ambulatory Visit: Payer: Self-pay

## 2021-02-03 ENCOUNTER — Ambulatory Visit: Payer: Medicare Other | Admitting: Physical Therapy

## 2021-02-03 DIAGNOSIS — R262 Difficulty in walking, not elsewhere classified: Secondary | ICD-10-CM | POA: Diagnosis not present

## 2021-02-03 DIAGNOSIS — M6281 Muscle weakness (generalized): Secondary | ICD-10-CM

## 2021-02-03 NOTE — Therapy (Signed)
[place], [name], [address] Phone: [phone]   Fax:  [phone]  Physical Therapy Treatment  Patient Details  Name: Dean Tith Sr. MRN: [id number] Date of Birth: 1951-06-12 Referring Provider (PT): Earnie Larsson, MD   Encounter Date: 02/03/2021   PT End of Session - 02/03/21 1424    Visit Number 6    Number of Visits 16    Date for PT Re-Evaluation 02/18/21    Authorization Time Period FOTO visit 6, 10; [place] Modifier Visit #15    PT Start Time 2248    PT Stop Time 1500    PT Time Calculation (min) 45 min    Equipment Utilized During Treatment Gait belt    Activity Tolerance Patient tolerated treatment well;No increased pain    Behavior During Therapy WFL for tasks assessed/performed           Past Medical History:  Diagnosis Date  . 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 Arthritis    "maybe in his spine" (09/05/2016)  . Asthma    pt. denies  . Cardiac arrest ([place]) 08/27/2016   REQUIRING CPR, SHOCK & MEDICATIONS  . CHF (congestive heart failure) ([place])   . Coronary artery disease    90% ostRCA stenosis and total occlusion of dRCA with L-->R collaterals 2017  . Dysrhythmia   . Gout   . High cholesterol   . Hypertension   . Ischemic cardiomyopathy   . LBBB (left bundle branch block)   . OSA on CPAP    does not wear CPAP  . Type II diabetes mellitus ([place])     Past Surgical History:  Procedure Laterality Date  . CARDIAC CATHETERIZATION N/A 09/07/2016   Procedure: Left Heart Cath and Coronary Angiography;  Surgeon: Peter M Martinique, MD;  Location: [facility];  Service: Cardiovascular;  Laterality: N/A;  . COLONOSCOPY WITH PROPOFOL N/A 08/30/2018   Procedure: COLONOSCOPY WITH PROPOFOL;  Surgeon: Carol Ada, MD;  Location: [facility];  Service: Endoscopy;  Laterality: N/A;  . EP IMPLANTABLE DEVICE N/A 10/10/2016   Procedure: ICD Implant;  Surgeon: Will  Meredith Leeds, MD;  Location: [facility];  Service: Cardiovascular;  Laterality: N/A;  . LUMBAR LAMINECTOMY/DECOMPRESSION MICRODISCECTOMY N/A 01/18/2018   Procedure: L2-3, L3-4, L-4-5 CENTRAL DECOMPRESSION;  Surgeon: [name], MD;  Location: [facility];  Service: Orthopedics;  Laterality: N/A;  . LUMBAR LAMINECTOMY/DECOMPRESSION MICRODISCECTOMY N/A 08/12/2020   Procedure: THORACIC ELEVEN LAMINECTOMY;  Surgeon: Earnie Larsson, MD;  Location: [facility];  Service: Neurosurgery;  Laterality: N/A;  . POLYPECTOMY  08/30/2018   Procedure: POLYPECTOMY;  Surgeon: Carol Ada, MD;  Location: [facility];  Service: Endoscopy;;    There were no vitals filed for this visit.   Subjective Assessment - 02/03/21 1421    Subjective Pt reports there was a miscommunication with his Cardiologist and he is to see them tomorrow. Pt notes he has been able to do the exercises "some". Limited time due to having to go to the job site; however, this should be finishing up and he'll have more time to do exercises.    Limitations Lifting;Standing;Walking;House hold activities    Patient Stated Goals I want to be able to walk without my walker.    Currently in Pain? No/denies                             [name] Adult PT Treatment/Exercise - 02/03/21 0001  Lumbar Exercises: Aerobic   Nustep L6 x 5 min LEs & UEs      Lumbar Exercises: Standing   Other Standing Lumbar Exercises Palloff press with red weighted ball x10; trunk rotation with weighted red ball x10; diagnoal chops with red weighted ball x10    Other Standing Lumbar Exercises weightshift forward/backward 2x10 bilat; weightshift L<>R with knee flex/ext 2x10 bilat      Knee/Hip Exercises: Standing   Hip Flexion Stengthening;Right;Left;2 sets;10 reps   slow   Hip Abduction Stengthening;Both;2 sets;10 reps    Abduction Limitations red tband    Hip Extension Stengthening;Both;2 sets;10 reps    Extension Limitations red tband    Other  Standing Knee Exercises Chair squat 2x10                    PT Short Term Goals - 01/13/21 1333      PT SHORT TERM GOAL #1   Title Pt will be I and compliant with initial HEP.    Status On-going      PT SHORT TERM GOAL #2   Title Pt will decrease 5XSTS to <30 seconds.    Status On-going      PT SHORT TERM GOAL #3   Title Pt will increase gait distance during 6MWT to 300 ft    Status On-going             PT Long Term Goals - 12/24/20 1502      PT LONG TERM GOAL #1   Title Pt will be independent with long term HEP for continued strength and endurance training to maintain mobility.    Time 8    Period Weeks    Status New    Target Date 02/18/21      PT LONG TERM GOAL #2   Title Pt will decrease 5xSTS time to </= 15 seconds to demonstrate improvement in lower extremity power and strength for decreased fall risk.    Baseline 48 seconds    Time 8    Period Weeks    Status New    Target Date 02/18/21      PT LONG TERM GOAL #3   Title Pt will demonstrate at least 4+/5 lower extremity MMT.    Time 8    Period Weeks    Status New    Target Date 02/18/21      PT LONG TERM GOAL #4   Title Pt will increase gait distance during 6MWT to 1831 feet, per age related norms to decrease fall risk and initiate community mobility.    Baseline 63 feet    Time 8    Period Weeks    Status New    Target Date 02/18/21      PT LONG TERM GOAL #5   Title Pt will improve FOTO ability to at least 60% to demonstrate meaningful change and improvement in perceived level of functional ability.    Baseline 51% ability    Time 8    Period Weeks    Status New    Target Date 02/18/21                 Plan - 02/03/21 1423    Clinical Impression Statement Continued to progress standing tolerance and exercises for LEs. Worked on pre-gait activities and balance. Pt remains limited due to fatigue.    Personal Factors and Comorbidities Age;Fitness;Time since onset of  injury/illness/exacerbation;Comorbidity 3+    Comorbidities HLD, HTN, arthritis, DM, CHF,  Examination-Activity Limitations Bathing;Locomotion Level;Transfers;Bend;Lift;Stand;Stairs;Squat    Examination-Participation Restrictions Community Activity;Interpersonal Relationship;Meal Prep    Stability/Clinical Decision Making Evolving/Moderate complexity    Rehab Potential Good    PT Frequency 2x / week    PT Duration 8 weeks    PT Treatment/Interventions ADLs/Self Care Home Management;Aquatic Therapy;Cryotherapy;Electrical Stimulation;Iontophoresis 4mg /ml Dexamethasone;Moist Heat;Gait training;Stair training;Functional mobility training;Therapeutic activities;Therapeutic exercise;Balance training;Neuromuscular re-education;Patient/family education;Manual techniques;Passive range of motion    PT Next Visit Plan DO FOTO!!!! Assess response to HEP. LE strengthening and endurance. Continue standing exercises and balance as pt tolerates    PT Home Exercise Plan [id number]    Consulted and Agree with Plan of Care Patient           Patient will benefit from skilled therapeutic intervention in order to improve the following deficits and impairments:  Difficulty walking,Decreased endurance,Decreased strength,Postural dysfunction,Decreased mobility,Decreased balance,Improper body mechanics,Impaired perceived functional ability,Decreased activity tolerance  Visit Diagnosis: Difficulty in walking, not elsewhere classified  Muscle weakness (generalized)     Problem List Patient Active Problem List   Diagnosis Date Noted  . 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 Atrial fibrillation, chronic ([place]) 08/12/2020  . Thoracic spine fracture ([place]) 06/29/2020  . Hyperkalemia 10/23/2019  . Diarrhea 10/23/2019  . CHF (congestive heart failure) ([place]) 06/04/2019  . Acute systolic CHF (congestive heart failure) ([place]) 06/04/2019  . Sepsis ([place]) 05/06/2019  . Acute asthma  exacerbation 05/06/2019  . Acute respiratory distress 05/06/2019  . Acute respiratory failure with hypoxia ([place]) 04/06/2019  . HCAP (healthcare-associated pneumonia) 04/06/2019  . Acute kidney failure ([place]) 02/27/2019  . Chronic combined systolic and diastolic CHF (congestive heart failure) ([place]) 02/27/2019  . CKD (chronic kidney disease), stage III ([place]) 05/16/2018  . Normochromic normocytic anemia 05/15/2018  . CAD (coronary artery disease) 12/17/2017  . Hyperlipidemia LDL goal <70 12/17/2017  . 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 Cardiomyopathy, ischemic   . Debility 10/04/2016  . Stage 3 chronic kidney disease ([place])   . Disorientated   . Anoxic brain injury ([place]) 09/15/2016  . Benign essential HTN   . OSA (obstructive sleep apnea)   . Transaminitis   . Chronic respiratory failure with hypoxia ([place])   . Fever   . NSVT (nonsustained ventricular tachycardia) ([place])   . Uncomplicated asthma   . Diabetes mellitus type 2 in obese ([place])   . Acute on chronic combined systolic and diastolic CHF (congestive heart failure) ([place])   . Tachypnea   . Hypokalemia   . Leukocytosis   . Hypoxemia   . Acute respiratory failure ([place])   . Cardiopulmonary arrest ([place]) 08/27/2016  . Class 2 obesity due to excess calories with body mass index (BMI) of 35.0 to 35.9 in adult 03/02/2014  . Essential hypertension, benign 03/02/2014    [facility] [address] PT, DPT 02/03/2021, 3:06 PM  [facility] [address] [place], [name], [address] Phone: [phone]   Fax:  [phone]  Name: Dean Booher Sr. MRN: [id number] Date of Birth: 1951/08/01

## 2021-02-04 ENCOUNTER — Ambulatory Visit: Payer: Medicare Other | Admitting: Physician Assistant
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
## 2021-02-08 ENCOUNTER — Encounter: Payer: Self-pay | Admitting: General Practice

## 2021-02-08 ENCOUNTER — Other Ambulatory Visit: Payer: Self-pay

## 2021-02-08 ENCOUNTER — Ambulatory Visit: Payer: Medicare Other | Admitting: General Practice

## 2021-02-08 ENCOUNTER — Ambulatory Visit: Payer: Medicare Other | Admitting: Physical Therapy

## 2021-02-08 ENCOUNTER — Encounter: Payer: Self-pay | Admitting: Physical Therapy

## 2021-02-08 VITALS — BP 128/62 | HR 78 | Ht 66.0 in | Wt 199.8 lb

## 2021-02-08 DIAGNOSIS — I10 Essential (primary) hypertension: Secondary | ICD-10-CM

## 2021-02-08 DIAGNOSIS — I5023 Acute on chronic systolic (congestive) heart failure: Secondary | ICD-10-CM

## 2021-02-08 DIAGNOSIS — M6281 Muscle weakness (generalized): Secondary | ICD-10-CM

## 2021-02-08 DIAGNOSIS — R262 Difficulty in walking, not elsewhere classified: Secondary | ICD-10-CM | POA: Diagnosis not present

## 2021-02-08 DIAGNOSIS — Z8674 Personal history of sudden cardiac arrest: Secondary | ICD-10-CM

## 2021-02-08 DIAGNOSIS — E785 Hyperlipidemia, unspecified: Secondary | ICD-10-CM | POA: Diagnosis not present

## 2021-02-08 DIAGNOSIS — I480 Paroxysmal atrial fibrillation: Secondary | ICD-10-CM | POA: Diagnosis not present

## 2021-02-08 NOTE — Patient Instructions (Signed)
Medication Instructions:  The current medical regimen is effective;  continue present plan and medications as directed. Please refer to the Current Medication list given to you today.  *If you need a refill on your cardiac medications before your next appointment, please call your pharmacy*  Lab Work:   Testing/Procedures:  NONE    NONE  Special Instructions PLEASE READ AND FOLLOW SALTY 6-ATTACHED-1,800mg  daily  PLEASE CONTINUE WITH PHYSICAL THERAPY EXERCISES  Follow-Up: Your next appointment:  6 month(s) In Person with K. Mali Hilty, MD OR IF UNAVAILABLE JESSE CLEAVER, FNP-C   Please call our office 2 months in advance to schedule this appointment   At [facility], you and your health needs are our priority.  As part of our continuing mission to provide you with exceptional heart care, we have created designated Provider Care Teams.  These Care Teams include your primary Cardiologist (physician) and Advanced Practice Providers (APPs -  Physician Assistants and Nurse Practitioners) who all work together to provide you with the care you need, when you need it.             6 SALTY THINGS TO AVOID     1,800MG  DAILY

## 2021-02-08 NOTE — Therapy (Signed)
[place], [name], [address] Phone: [phone]   Fax:  [phone]  Physical Therapy Treatment  Patient Details  Name: Dean Imran Sr. MRN: [id number] Date of Birth: [birth year] Referring Provider (PT): Earnie Larsson, MD   Encounter Date: 02/08/2021   PT End of Session - 02/08/21 1400    Visit Number 7    Number of Visits 16    Date for PT Re-Evaluation 02/18/21    Authorization Time Period FOTO visit 6, 10; [place] Modifier Visit #15    Progress Note Due on Visit 10    PT Start Time 1401    PT Stop Time 1444    PT Time Calculation (min) 43 min    Activity Tolerance Patient tolerated treatment well;No increased pain    Behavior During Therapy WFL for tasks assessed/performed           Past Medical History:  Diagnosis Date  . 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 Arthritis    "maybe in his spine" (09/05/2016)  . Asthma    pt. denies  . Cardiac arrest ([place]) 08/27/2016   REQUIRING CPR, SHOCK & MEDICATIONS  . CHF (congestive heart failure) ([place])   . Coronary artery disease    90% ostRCA stenosis and total occlusion of dRCA with L-->R collaterals 2017  . Dysrhythmia   . Gout   . High cholesterol   . Hypertension   . Ischemic cardiomyopathy   . LBBB (left bundle branch block)   . OSA on CPAP    does not wear CPAP  . Type II diabetes mellitus ([place])     Past Surgical History:  Procedure Laterality Date  . CARDIAC CATHETERIZATION N/A 09/07/2016   Procedure: Left Heart Cath and Coronary Angiography;  Surgeon: Peter M Martinique, MD;  Location: [facility];  Service: Cardiovascular;  Laterality: N/A;  . COLONOSCOPY WITH PROPOFOL N/A 08/30/2018   Procedure: COLONOSCOPY WITH PROPOFOL;  Surgeon: Carol Ada, MD;  [facility];  Service: Endoscopy;  Laterality: N/A;  . EP IMPLANTABLE DEVICE N/A 10/10/2016   Procedure: ICD Implant;  Surgeon: Will Meredith Leeds,  MD;  Location: [facility];  Service: Cardiovascular;  Laterality: N/A;  . LUMBAR LAMINECTOMY/DECOMPRESSION MICRODISCECTOMY N/A 01/18/2018   Procedure: L2-3, L3-4, L-4-5 CENTRAL DECOMPRESSION;  Surgeon: [name], MD;  Location: [facility];  Service: Orthopedics;  Laterality: N/A;  . LUMBAR LAMINECTOMY/DECOMPRESSION MICRODISCECTOMY N/A 08/12/2020   Procedure: THORACIC ELEVEN LAMINECTOMY;  Surgeon: Earnie Larsson, MD;  Location: [facility];  Service: Neurosurgery;  Laterality: N/A;  . POLYPECTOMY  08/30/2018   Procedure: POLYPECTOMY;  Surgeon: Carol Ada, MD;  [facility];  Service: Endoscopy;;    There were no vitals filed for this visit.   Subjective Assessment - 02/08/21 1405    Subjective Patient reports he's really tired today because he's had a busy schedule today. 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 No pain. Patient reports cardiologist states he is "doing good, under control" and to keep doing PT.    Limitations Lifting;Standing;Walking;House hold activities    Patient Stated Goals I want to be able to walk without my walker.    Currently in Pain? No/denies              [facility] PT Assessment - 02/08/21 0001      Assessment   Medical Diagnosis S22.088A (ICD-10-CM) - Other fracture of t11-T12 vertebra, initial encounter for closed fracture    Referring  Provider (PT) Earnie Larsson, MD      Observation/Other Assessments   Focus on Therapeutic Outcomes (FOTO)  44%, 60% predicted   Initially 51%                        [facility] Adult PT Treatment/Exercise - 02/08/21 0001      Transfers   Transfers Sit to Stand;Stand to Sit   Verbal cues for use of arm rests instead of parallel bars.   Squat Pivot Transfers 5: Supervision   Verbal cues for scoot forward, weight shift over feet, lift buttocks, step and reach. R/L on/off NuStep     Therapeutic Activites    Other Therapeutic Activities Side stepping along parallel bar 6'x3 ea dir, tap  ups to 2" then 4" step with min UE support 2x10ea      Neuro Re-ed    Neuro Re-ed Details  Standing weight shift in bars R/L with decreased UE support as tolerated.      Lumbar Exercises: Aerobic   Nustep L6 x 5 min LEs & UEs      Lumbar Exercises: Standing   Other Standing Lumbar Exercises weightshift forward/backward 2x10 bilat; weightshift L<>R with knee flex/ext 2x10 bilat      Knee/Hip Exercises: Standing   Other Standing Knee Exercises Chair squat 2x10                  PT Education - 02/08/21 1411    Education Details Discussion about continued compliance with HEP and mobilizing out of chair when safely able.    Person(s) Educated Patient    Methods Explanation    Comprehension Verbalized understanding            PT Short Term Goals - 01/13/21 1333      PT SHORT TERM GOAL #1   Title Pt will be I and compliant with initial HEP.    Status On-going      PT SHORT TERM GOAL #2   Title Pt will decrease 5XSTS to <30 seconds.    Status On-going      PT SHORT TERM GOAL #3   Title Pt will increase gait distance during 6MWT to 300 ft    Status On-going             PT Long Term Goals - 12/24/20 1502      PT LONG TERM GOAL #1   Title Pt will be independent with long term HEP for continued strength and endurance training to maintain mobility.    Time 8    Period Weeks    Status New    Target Date 02/18/21      PT LONG TERM GOAL #2   Title Pt will decrease 5xSTS time to </= 15 seconds to demonstrate improvement in lower extremity power and strength for decreased fall risk.    Baseline 48 seconds    Time 8    Period Weeks    Status New    Target Date 02/18/21      PT LONG TERM GOAL #3   Title Pt will demonstrate at least 4+/5 lower extremity MMT.    Time 8    Period Weeks    Status New    Target Date 02/18/21      PT LONG TERM GOAL #4   Title Pt will increase gait distance during 6MWT to 1831 feet, per age related norms to decrease fall risk and  initiate community mobility.    Baseline  63 feet    Time 8    Period Weeks    Status New    Target Date 02/18/21      PT LONG TERM GOAL #5   Title Pt will improve FOTO ability to at least 60% to demonstrate meaningful change and improvement in perceived level of functional ability.    Baseline 51% ability    Time 8    Period Weeks    Status New    Target Date 02/18/21                 Plan - 02/08/21 1413    Clinical Impression Statement Patient squat pivot and sit <> stand improved with cues for position on chair, weight shift over feet, use of arm rests instead of pull up with bars. Patient continues to be challenged by decreased standing tolerance.  Patient able to side step along parallel bar with close SBA and light UE support 3x6' ea dir. Patient will benefit from skilled PT to address deficits and maximize safe functional mobility.    Personal Factors and Comorbidities Age;Fitness;Time since onset of injury/illness/exacerbation;Comorbidity 3+    Comorbidities HLD, HTN, arthritis, DM, CHF,    Examination-Activity Limitations Bathing;Locomotion Level;Transfers;Bend;Lift;Stand;Stairs;Squat    Examination-Participation Restrictions Community Activity;Interpersonal Relationship;Meal Prep    PT Treatment/Interventions ADLs/Self Care Home Management;Aquatic Therapy;Cryotherapy;Electrical Stimulation;Iontophoresis 4mg /ml Dexamethasone;Moist Heat;Gait training;Stair training;Functional mobility training;Therapeutic activities;Therapeutic exercise;Balance training;Neuromuscular re-education;Patient/family education;Manual techniques;Passive range of motion    PT Next Visit Plan Assess response to HEP. LE strengthening and endurance. Continue standing exercises and balance as pt tolerates    PT Home Exercise Plan [id number]    Consulted and Agree with Plan of Care Patient           Patient will benefit from skilled therapeutic intervention in order to improve the following deficits  and impairments:  Difficulty walking,Decreased endurance,Decreased strength,Postural dysfunction,Decreased mobility,Decreased balance,Improper body mechanics,Impaired perceived functional ability,Decreased activity tolerance  Visit Diagnosis: Difficulty in walking, not elsewhere classified  Muscle weakness (generalized)     Problem List Patient Active Problem List   Diagnosis Date Noted  . 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 Atrial fibrillation, chronic ([place]) 08/12/2020  . Thoracic spine fracture ([place]) 06/29/2020  . Hyperkalemia 10/23/2019  . Diarrhea 10/23/2019  . CHF (congestive heart failure) ([place]) 06/04/2019  . Acute systolic CHF (congestive heart failure) ([place]) 06/04/2019  . Sepsis ([place]) 05/06/2019  . Acute asthma exacerbation 05/06/2019  . Acute respiratory distress 05/06/2019  . Acute respiratory failure with hypoxia ([place]) 04/06/2019  . HCAP (healthcare-associated pneumonia) 04/06/2019  . Acute kidney failure ([place]) 02/27/2019  . Chronic combined systolic and diastolic CHF (congestive heart failure) ([place]) 02/27/2019  . CKD (chronic kidney disease), stage III ([place]) 05/16/2018  . Normochromic normocytic anemia 05/15/2018  . CAD (coronary artery disease) 12/17/2017  . Hyperlipidemia LDL goal <70 12/17/2017  . 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 Cardiomyopathy, ischemic   . Debility 10/04/2016  . Stage 3 chronic kidney disease ([place])   . Disorientated   . Anoxic brain injury ([place]) 09/15/2016  . Benign essential HTN   . OSA (obstructive sleep apnea)   . Transaminitis   . Chronic respiratory failure with hypoxia ([place])   . Fever   . NSVT (nonsustained ventricular tachycardia) ([place])   . Uncomplicated asthma   . Diabetes mellitus type 2 in obese ([place])   . Acute on chronic combined systolic and diastolic CHF (congestive heart failure) ([place])   . Tachypnea   .  Hypokalemia   . Leukocytosis   . Hypoxemia   . Acute  respiratory failure ([place])   . Cardiopulmonary arrest ([place]) 08/27/2016  . Class 2 obesity due to excess calories with body mass index (BMI) of 35.0 to 35.9 in adult 03/02/2014  . Essential hypertension, benign 03/02/2014    Pollyann Samples, PT 02/08/2021, 4:36 PM  [facility] [address] [place], [name], [address] Phone: [phone]   Fax:  [phone]  Name: Dean Kozlov Sr. MRN: [id number] Date of Birth: Aug 18, 1951

## 2021-02-10 ENCOUNTER — Ambulatory Visit: Payer: Medicare Other | Admitting: Physical Therapy

## 2021-02-15 ENCOUNTER — Encounter: Payer: Self-pay | Admitting: Physical Therapy

## 2021-02-15 ENCOUNTER — Ambulatory Visit: Payer: Medicare Other | Admitting: Physical Therapy

## 2021-02-15 ENCOUNTER — Other Ambulatory Visit: Payer: Self-pay

## 2021-02-15 DIAGNOSIS — R262 Difficulty in walking, not elsewhere classified: Secondary | ICD-10-CM

## 2021-02-15 DIAGNOSIS — M6281 Muscle weakness (generalized): Secondary | ICD-10-CM

## 2021-02-15 NOTE — Therapy (Signed)
[place], [name], [address] Phone: [phone]   Fax:  [phone]  Physical Therapy Treatment/Progress Note Progress Note Reporting Period 12/24/2020 to 02/14/2021  See note below for Objective Data and Assessment of Progress/Goals.       Patient Details  Name: Dean Egnor Sr. MRN: [id number] Date of Birth: [birth year] Referring Provider (PT): Earnie Larsson, MD   Encounter Date: 02/15/2021   PT End of Session - 02/15/21 1229    Visit Number 8    Number of Visits 16    Date for PT Re-Evaluation 02/18/21    Authorization Time Period FOTO visit 6, 10; [place] Modifier Visit #15    Progress Note Due on Visit 10    PT Start Time 1220    PT Stop Time 1259    PT Time Calculation (min) 39 min    Equipment Utilized During Treatment Gait belt    Activity Tolerance Patient tolerated treatment well;No increased pain    Behavior During Therapy WFL for tasks assessed/performed           Past Medical History:  Diagnosis Date  . 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 Arthritis    "maybe in his spine" (09/05/2016)  . Asthma    pt. denies  . Cardiac arrest ([place]) 08/27/2016   REQUIRING CPR, SHOCK & MEDICATIONS  . CHF (congestive heart failure) ([place])   . Coronary artery disease    90% ostRCA stenosis and total occlusion of dRCA with L-->R collaterals 2017  . Dysrhythmia   . Gout   . High cholesterol   . Hypertension   . Ischemic cardiomyopathy   . LBBB (left bundle branch block)   . OSA on CPAP    does not wear CPAP  . Type II diabetes mellitus ([place])     Past Surgical History:  Procedure Laterality Date  . CARDIAC CATHETERIZATION N/A 09/07/2016   Procedure: Left Heart Cath and Coronary Angiography;  Surgeon: Peter M Martinique, MD;  Location: [facility];  Service: Cardiovascular;  Laterality: N/A;  . COLONOSCOPY WITH PROPOFOL N/A 08/30/2018   Procedure: COLONOSCOPY WITH  PROPOFOL;  Surgeon: Carol Ada, MD;  [facility];  Service: Endoscopy;  Laterality: N/A;  . EP IMPLANTABLE DEVICE N/A 10/10/2016   Procedure: ICD Implant;  Surgeon: Will Meredith Leeds, MD;  Location: [facility];  Service: Cardiovascular;  Laterality: N/A;  . LUMBAR LAMINECTOMY/DECOMPRESSION MICRODISCECTOMY N/A 01/18/2018   Procedure: L2-3, L3-4, L-4-5 CENTRAL DECOMPRESSION;  Surgeon: [name], MD;  Location: [facility];  Service: Orthopedics;  Laterality: N/A;  . LUMBAR LAMINECTOMY/DECOMPRESSION MICRODISCECTOMY N/A 08/12/2020   Procedure: THORACIC ELEVEN LAMINECTOMY;  Surgeon: Earnie Larsson, MD;  Location: [facility];  Service: Neurosurgery;  Laterality: N/A;  . POLYPECTOMY  08/30/2018   Procedure: POLYPECTOMY;  Surgeon: Carol Ada, MD;  [facility];  Service: Endoscopy;;    There were no vitals filed for this visit.   Subjective Assessment - 02/15/21 1225    Subjective Patient reports it's going well, good compliance with HEP and walking around his room "free handed".  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    Limitations Lifting;Standing;Walking;House hold activities    Currently in Pain? No/denies              [facility] PT Assessment - 02/15/21 0001      Assessment   Medical Diagnosis S22.088A (ICD-10-CM) - Other fracture of t11-T12 vertebra, initial encounter for closed fracture  Referring Provider (PT) Earnie Larsson, MD      Strength   Overall Strength Comments Bilat knees grossly 4/5; bilat hip flexion 3+/5, abduction 4-/5      Transfers   Transfers Sit to Stand;Stand to Sit    Five time sit to stand comments  25 sec    Squat Pivot Transfers 5: Supervision      6 minute walk test results    Aerobic Endurance Distance Walked 48   In parallel bars without UE support     Berg Balance Test   Sit to Stand Able to stand using hands after several tries    Standing Unsupported Able to stand 30 seconds unsupported    Sitting  with Back Unsupported but Feet Supported on Floor or Stool Able to sit safely and securely 2 minutes    Stand to Sit Uses backs of legs against chair to control descent    Transfers Able to transfer with verbal cueing and /or supervision    Standing Unsupported with Eyes Closed Unable to keep eyes closed 3 seconds but stays steady    Standing Unsupported with Feet Together Needs help to attain position but able to stand for 30 seconds with feet together    From Standing, Reach Forward with Outstretched Arm Can reach forward >12 cm safely (5")    From Standing Position, Pick up Object from Floor Unable to pick up and needs supervision    From Standing Position, Turn to Look Behind Over each Shoulder Needs supervision when turning    Turn 360 Degrees Needs assistance while turning    Standing Unsupported, Alternately Place Feet on Step/Stool Needs assistance to keep from falling or unable to try   Able to perform 2" step with R, LOB with L.   Standing Unsupported, One Foot in [place] to take small step independently and hold 30 seconds    Standing on One Leg Unable to try or needs assist to prevent fall    Total Score 21                         [facility] Adult PT Treatment/Exercise - 02/15/21 0001      Therapeutic Activites    Other Therapeutic Activities Side stepping along parallel bar 12' R/L 'x3 ea dir, tap ups to 2" then 4" step with min UE support 2x10ea      Lumbar Exercises: Aerobic   Nustep L7 x17min      Lumbar Exercises: Seated   Other Seated Lumbar Exercises Seated Marching x20ea                  PT Education - 02/15/21 1228    Education Details Patient encouraged to keep progressing with moving around out of chair with UE support in reach to reduce risk of falls.    Person(s) Educated Patient    Methods Explanation;Verbal cues    Comprehension Verbalized understanding;Returned demonstration            PT Short Term Goals - 02/15/21 1240      PT  SHORT TERM GOAL #1   Status Achieved      PT SHORT TERM GOAL #2   Title Pt will decrease 5XSTS to <30 seconds.    Status Achieved      PT SHORT TERM GOAL #3   Title Pt will increase gait distance during 6MWT to 300 ft    Status On-going  PT Long Term Goals - 02/15/21 1332      PT LONG TERM GOAL #1   Title Pt will be independent with long term HEP for continued strength and endurance training to maintain mobility.    Status On-going    Target Date 04/12/21      PT LONG TERM GOAL #2   Title Pt will decrease 5xSTS time to </= 15 seconds to demonstrate improvement in lower extremity power and strength for decreased fall risk.    Baseline 48 sec at IE 10/03/3788, 25 sec at recert 2/40/9735    Status On-going      PT LONG TERM GOAL #3   Title Pt will demonstrate at least 4+/5 lower extremity MMT.    Status On-going    Target Date 04/12/21      PT LONG TERM GOAL #4   Title Pt will increase gait distance during 6MWT to 1831 feet, per age related norms to decrease fall risk and initiate community mobility.    Status On-going    Target Date 04/12/21      PT LONG TERM GOAL #5   Title Pt will improve FOTO ability to at least 60% to demonstrate meaningful change and improvement in perceived level of functional ability.    Baseline 51% 12/24/2020; 44% 02/14/2021    Status On-going    Target Date 04/12/21      Additional Long Term Goals   Additional Long Term Goals Yes      PT LONG TERM GOAL #6   Title Patient will improve BERG BALANCE score to > 45 for reduced risk of falls with functional mobility.    Baseline BERG=21 02/14/2021    Time 8    Period Weeks    Status New    Target Date 04/12/21                 Plan - 02/15/21 1236    Clinical Impression Statement Patient improved with gait today in parallel bars, 2x12' without UE support except for 2 reaches for LOB.  Patient continues to be challenged by poor foot clearance and decreased stride/pace. Berg  Balance test =21 performed today, FOTO decreased taken last visit from 51% at initial evaluation, now 44% although patient reports significant improvment with mobility at home including "walking around his room briefly".  Patient improved 5xsit<>stand time from 48 sec to 25 sec.  Patient making good progress toward long term goals and would benefit from continued skilled PT for an additional 8 weeks to maximize safe functional mobility and return to household ambulation without difficulty.    Personal Factors and Comorbidities Age;Fitness;Time since onset of injury/illness/exacerbation;Comorbidity 3+    Comorbidities HLD, HTN, arthritis, DM, CHF,    Examination-Activity Limitations Bathing;Locomotion Level;Transfers;Bend;Lift;Stand;Stairs;Squat    Examination-Participation Restrictions Community Activity;Interpersonal Relationship;Meal Prep    Rehab Potential Good    PT Frequency 2x / week    PT Duration 8 weeks    PT Treatment/Interventions ADLs/Self Care Home Management;Aquatic Therapy;Cryotherapy;Electrical Stimulation;Iontophoresis 4mg /ml Dexamethasone;Moist Heat;Gait training;Stair training;Functional mobility training;Therapeutic activities;Therapeutic exercise;Balance training;Neuromuscular re-education;Patient/family education;Manual techniques;Passive range of motion    PT Next Visit Plan Assess response to HEP. LE strengthening and endurance. Continue standing exercises and balance as pt tolerates    PT Home Exercise Plan [id number]    Consulted and Agree with Plan of Care Patient           Patient will benefit from skilled therapeutic intervention in order to improve the following deficits and impairments:  Difficulty walking,Decreased endurance,Decreased  strength,Postural dysfunction,Decreased mobility,Decreased balance,Improper body mechanics,Impaired perceived functional ability,Decreased activity tolerance  Visit Diagnosis: Difficulty in walking, not elsewhere classified  Muscle  weakness (generalized)     Problem List Patient Active Problem List   Diagnosis Date Noted  . 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 Atrial fibrillation, chronic ([place]) 08/12/2020  . Thoracic spine fracture ([place]) 06/29/2020  . Hyperkalemia 10/23/2019  . Diarrhea 10/23/2019  . CHF (congestive heart failure) ([place]) 06/04/2019  . Acute systolic CHF (congestive heart failure) ([place]) 06/04/2019  . Sepsis ([place]) 05/06/2019  . Acute asthma exacerbation 05/06/2019  . Acute respiratory distress 05/06/2019  . Acute respiratory failure with hypoxia ([place]) 04/06/2019  . HCAP (healthcare-associated pneumonia) 04/06/2019  . Acute kidney failure ([place]) 02/27/2019  . Chronic combined systolic and diastolic CHF (congestive heart failure) ([place]) 02/27/2019  . CKD (chronic kidney disease), stage III ([place]) 05/16/2018  . Normochromic normocytic anemia 05/15/2018  . CAD (coronary artery disease) 12/17/2017  . Hyperlipidemia LDL goal <70 12/17/2017  . 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 Cardiomyopathy, ischemic   . Debility 10/04/2016  . Stage 3 chronic kidney disease ([place])   . Disorientated   . Anoxic brain injury ([place]) 09/15/2016  . Benign essential HTN   . OSA (obstructive sleep apnea)   . Transaminitis   . Chronic respiratory failure with hypoxia ([place])   . Fever   . NSVT (nonsustained ventricular tachycardia) ([place])   . Uncomplicated asthma   . Diabetes mellitus type 2 in obese ([place])   . Acute on chronic combined systolic and diastolic CHF (congestive heart failure) ([place])   . Tachypnea   . Hypokalemia   . Leukocytosis   . Hypoxemia   . Acute respiratory failure ([place])   . Cardiopulmonary arrest ([place]) 08/27/2016  . Class 2 obesity due to excess calories with body mass index (BMI) of 35.0 to 35.9 in adult 03/02/2014  . Essential hypertension, benign 03/02/2014    Pollyann Samples, PT 02/15/2021, 1:40 PM  [facility] [address] [place], [name], [address] Phone: [phone]   Fax:  [phone]  Name: Tiegan Terpstra Sr. MRN: [id number] Date of Birth: [birth year]

## 2021-02-17 ENCOUNTER — Ambulatory Visit: Payer: Medicare Other | Admitting: Physical Therapy

## 2021-02-23 ENCOUNTER — Encounter: Payer: Self-pay | Admitting: Physical Therapy

## 2021-02-23 ENCOUNTER — Ambulatory Visit: Payer: Medicare Other | Attending: Neurosurgery | Admitting: Physical Therapy

## 2021-02-23 ENCOUNTER — Other Ambulatory Visit: Payer: Self-pay

## 2021-02-23 DIAGNOSIS — M6281 Muscle weakness (generalized): Secondary | ICD-10-CM | POA: Diagnosis present

## 2021-02-23 DIAGNOSIS — R262 Difficulty in walking, not elsewhere classified: Secondary | ICD-10-CM | POA: Diagnosis not present

## 2021-02-23 NOTE — Therapy (Addendum)
[place], [name], [address] Phone: [phone]   Fax:  [phone]  Physical Therapy Treatment  Patient Details  Name: Dean Petrosino Sr. MRN: [id number] Date of Birth: [birth year] Referring Provider (PT): Earnie Larsson, MD   Encounter Date: 02/23/2021   PT End of Session - 02/23/21 1405    Visit Number 9    Number of Visits 16    Date for PT Re-Evaluation 02/18/21    Authorization Time Period FOTO visit 6, 10; [place] Modifier Visit #15    Progress Note Due on Visit 10    PT Start Time 1405    PT Stop Time 1445    PT Time Calculation (min) 40 min    Activity Tolerance Patient tolerated treatment well;No increased pain    Behavior During Therapy WFL for tasks assessed/performed           Past Medical History:  Diagnosis Date  . 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 Arthritis    "maybe in his spine" (09/05/2016)  . Asthma    pt. denies  . Cardiac arrest ([place]) 08/27/2016   REQUIRING CPR, SHOCK & MEDICATIONS  . CHF (congestive heart failure) ([place])   . Coronary artery disease    90% ostRCA stenosis and total occlusion of dRCA with L-->R collaterals 2017  . Dysrhythmia   . Gout   . High cholesterol   . Hypertension   . Ischemic cardiomyopathy   . LBBB (left bundle branch block)   . OSA on CPAP    does not wear CPAP  . Type II diabetes mellitus ([place])     Past Surgical History:  Procedure Laterality Date  . CARDIAC CATHETERIZATION N/A 09/07/2016   Procedure: Left Heart Cath and Coronary Angiography;  Surgeon: Peter M Martinique, MD;  Location: [facility];  Service: Cardiovascular;  Laterality: N/A;  . COLONOSCOPY WITH PROPOFOL N/A 08/30/2018   Procedure: COLONOSCOPY WITH PROPOFOL;  Surgeon: Carol Ada, MD;  [facility];  Service: Endoscopy;  Laterality: N/A;  . EP IMPLANTABLE DEVICE N/A 10/10/2016   Procedure: ICD Implant;  Surgeon: Will Meredith Leeds,  MD;  Location: [facility];  Service: Cardiovascular;  Laterality: N/A;  . LUMBAR LAMINECTOMY/DECOMPRESSION MICRODISCECTOMY N/A 01/18/2018   Procedure: L2-3, L3-4, L-4-5 CENTRAL DECOMPRESSION;  Surgeon: [name], MD;  Location: [facility];  Service: Orthopedics;  Laterality: N/A;  . LUMBAR LAMINECTOMY/DECOMPRESSION MICRODISCECTOMY N/A 08/12/2020   Procedure: THORACIC ELEVEN LAMINECTOMY;  Surgeon: Earnie Larsson, MD;  Location: [facility];  Service: Neurosurgery;  Laterality: N/A;  . POLYPECTOMY  08/30/2018   Procedure: POLYPECTOMY;  Surgeon: Carol Ada, MD;  [facility];  Service: Endoscopy;;    There were no vitals filed for this visit.   Subjective Assessment - 02/23/21 1410    Subjective Patient reports taking steps at home without a device, grandson sometimes follows with chair.    Limitations Lifting;Standing;Walking;House hold activities    Patient Stated Goals I want to be able to walk without my walker.    Currently in Pain? No/denies              [facility] PT Assessment - 02/23/21 0001      Assessment   Medical Diagnosis S22.088A (ICD-10-CM) - Other fracture of t11-T12 vertebra, initial encounter for closed fracture    Referring Provider (PT) Earnie Larsson, MD  [facility] Adult PT Treatment/Exercise - 02/23/21 0001      Ambulation/Gait   Ambulation/Gait Yes    Ambulation/Gait Assistance 5: Supervision    Gait Comments Amb inparallel bars 4x12' with UE support 1x for mild LOB, very short stride cues for increased foot clearance. Amb with RW x 60' with improved stride.      Posture/Postural Control   Posture Comments Standing at bar with chair behind, reaching activity in various planes out of BOS.      Lumbar Exercises: Stretches   Quadruped Mid Back Stretch 5 reps   Followed by weigh shift R/L.     Lumbar Exercises: Aerobic   Nustep L7x99min      Lumbar Exercises: Seated   Other Seated Lumbar Exercises Seated Marching x20ea       Lumbar Exercises: Prone   Straight Leg Raises Limitations Attemp hip ext, unable    Other Prone Lumbar Exercises Glute sets 2secx10    Other Prone Lumbar Exercises Hamstring curl 2x10ea R/L                  PT Education - 02/23/21 1413    Education Details Patient given additions to HEP with performance for glute/hamstring strength.  Recommend use of AD to reduce risk of falls when walking.    Person(s) Educated Patient    Methods Explanation;Verbal cues;Demonstration;Handout    Comprehension Verbalized understanding;Returned demonstration;Verbal cues required            PT Short Term Goals - 02/15/21 1240      PT SHORT TERM GOAL #1   Status Achieved      PT SHORT TERM GOAL #2   Title Pt will decrease 5XSTS to <30 seconds.    Status Achieved      PT SHORT TERM GOAL #3   Title Pt will increase gait distance during 6MWT to 300 ft    Status On-going             PT Long Term Goals - 02/15/21 1332      PT LONG TERM GOAL #1   Title Pt will be independent with long term HEP for continued strength and endurance training to maintain mobility.    Status On-going    Target Date 04/12/21      PT LONG TERM GOAL #2   Title Pt will decrease 5xSTS time to </= 15 seconds to demonstrate improvement in lower extremity power and strength for decreased fall risk.    Baseline 48 sec at IE 8/0/9983, 25 sec at recert 3/82/5053    Status On-going      PT LONG TERM GOAL #3   Title Pt will demonstrate at least 4+/5 lower extremity MMT.    Status On-going    Target Date 04/12/21      PT LONG TERM GOAL #4   Title Pt will increase gait distance during 6MWT to 1831 feet, per age related norms to decrease fall risk and initiate community mobility.    Status On-going    Target Date 04/12/21      PT LONG TERM GOAL #5   Title Pt will improve FOTO ability to at least 60% to demonstrate meaningful change and improvement in perceived level of functional ability.    Baseline 51%  12/24/2020; 44% 02/14/2021    Status On-going    Target Date 04/12/21      Additional Long Term Goals   Additional Long Term Goals Yes      PT LONG TERM GOAL #6  Title Patient will improve BERG BALANCE score to > 45 for reduced risk of falls with functional mobility.    Baseline BERG=21 02/14/2021    Time 8    Period Weeks    Status New    Target Date 04/12/21                 Plan - 02/23/21 1415    Clinical Impression Statement Patient improving well toward goals, ambulated today without UE support in parallel bars x48' without a rest break. Also 6' with walker.  Patient with short stride and poor foot clearance when walking without UE support, improves while using walker. Patient challenged by prone hip extension, given glute and prone hamstring curl to initiate posterior chain strength.    Personal Factors and Comorbidities Age;Fitness;Time since onset of injury/illness/exacerbation;Comorbidity 3+    Comorbidities HLD, HTN, arthritis, DM, CHF,    Examination-Activity Limitations Bathing;Locomotion Level;Transfers;Bend;Lift;Stand;Stairs;Squat    Examination-Participation Restrictions Community Activity;Interpersonal Relationship;Meal Prep    PT Treatment/Interventions ADLs/Self Care Home Management;Aquatic Therapy;Cryotherapy;Electrical Stimulation;Iontophoresis 4mg /ml Dexamethasone;Moist Heat;Gait training;Stair training;Functional mobility training;Therapeutic activities;Therapeutic exercise;Balance training;Neuromuscular re-education;Patient/family education;Manual techniques;Passive range of motion    PT Next Visit Plan Assess response to HEP. LE strengthening and endurance. Continue standing exercises and balance as pt tolerates. 74min walk test.  Post chain strengthening.    PT Home Exercise Plan [email or web address]    Consulted and Agree with Plan of Care Patient           Patient will benefit from skilled therapeutic intervention in order to improve the following deficits and  impairments:  Difficulty walking,Decreased endurance,Decreased strength,Postural dysfunction,Decreased mobility,Decreased balance,Improper body mechanics,Impaired perceived functional ability,Decreased activity tolerance  Visit Diagnosis: Difficulty in walking, not elsewhere classified  Muscle weakness (generalized)     Problem List Patient Active Problem List   Diagnosis Date Noted  . 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 Atrial fibrillation, chronic ([place]) 08/12/2020  . Thoracic spine fracture ([place]) 06/29/2020  . Hyperkalemia 10/23/2019  . Diarrhea 10/23/2019  . CHF (congestive heart failure) ([place]) 06/04/2019  . Acute systolic CHF (congestive heart failure) ([place]) 06/04/2019  . Sepsis ([place]) 05/06/2019  . Acute asthma exacerbation 05/06/2019  . Acute respiratory distress 05/06/2019  . Acute respiratory failure with hypoxia ([place]) 04/06/2019  . HCAP (healthcare-associated pneumonia) 04/06/2019  . Acute kidney failure ([place]) 02/27/2019  . Chronic combined systolic and diastolic CHF (congestive heart failure) ([place]) 02/27/2019  . CKD (chronic kidney disease), stage III ([place]) 05/16/2018  . Normochromic normocytic anemia 05/15/2018  . CAD (coronary artery disease) 12/17/2017  . Hyperlipidemia LDL goal <70 12/17/2017  . 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 Cardiomyopathy, ischemic   . Debility 10/04/2016  . Stage 3 chronic kidney disease ([place])   . Disorientated   . Anoxic brain injury ([place]) 09/15/2016  . Benign essential HTN   . OSA (obstructive sleep apnea)   . Transaminitis   . Chronic respiratory failure with hypoxia ([place])   . Fever   . NSVT (nonsustained ventricular tachycardia) ([place])   . Uncomplicated asthma   . Diabetes mellitus type 2 in obese ([place])   . Acute on chronic combined systolic and diastolic CHF (congestive heart failure) ([place])   . Tachypnea   . Hypokalemia   . Leukocytosis   . Hypoxemia   . Acute  respiratory failure ([place])   . Cardiopulmonary arrest ([place]) 08/27/2016  . Class 2 obesity due to excess calories with body mass index (BMI) of 35.0 to 35.9 in  adult 03/02/2014  . Essential hypertension, benign 03/02/2014    Pollyann Samples, PT 02/23/2021, 3:04 PM  [name] [address] [place], [name], [address] Phone: [phone]   Fax:  [phone]  Name: Dean Lemelle Sr. MRN: [id number] Date of Birth: 1951/02/16

## 2021-02-23 NOTE — Patient Instructions (Signed)
Access Code: [email or web address] URL: [email or web address] Date: 02/23/2021 Prepared by: Pollyann Samples  NEW Exercises  Prone Knee Flexion - 1 x daily - 7 x weekly - 2 sets - 10 reps Prone Gluteal Sets - 1 x daily - 7 x weekly - 2 sets - 10 reps

## 2021-03-01 ENCOUNTER — Ambulatory Visit: Payer: Medicare Other | Admitting: Physical Therapy

## 2021-03-03 ENCOUNTER — Telehealth: Payer: Self-pay | Admitting: Physical Therapy

## 2021-03-03 ENCOUNTER — Ambulatory Visit: Payer: Medicare Other | Admitting: Physical Therapy

## 2021-03-03 NOTE — Telephone Encounter (Signed)
Left message about building closure and appointment cancellation.

## 2021-03-07 ENCOUNTER — Other Ambulatory Visit: Payer: Self-pay | Admitting: Internal Medicine

## 2021-03-07 DIAGNOSIS — I10 Essential (primary) hypertension: Secondary | ICD-10-CM

## 2021-03-07 DIAGNOSIS — I5042 Chronic combined systolic (congestive) and diastolic (congestive) heart failure: Secondary | ICD-10-CM

## 2021-03-07 DIAGNOSIS — E785 Hyperlipidemia, unspecified: Secondary | ICD-10-CM

## 2021-03-07 DIAGNOSIS — I480 Paroxysmal atrial fibrillation: Secondary | ICD-10-CM

## 2021-03-07 DIAGNOSIS — E118 Type 2 diabetes mellitus with unspecified complications: Secondary | ICD-10-CM

## 2021-03-07 DIAGNOSIS — I2510 Atherosclerotic heart disease of native coronary artery without angina pectoris: Secondary | ICD-10-CM

## 2021-03-07 DIAGNOSIS — Z8674 Personal history of sudden cardiac arrest: Secondary | ICD-10-CM

## 2021-03-07 DIAGNOSIS — I255 Ischemic cardiomyopathy: Secondary | ICD-10-CM

## 2021-03-07 DIAGNOSIS — N183 Chronic kidney disease, stage 3 unspecified: Secondary | ICD-10-CM

## 2021-03-07 DIAGNOSIS — Z95810 Presence of automatic (implantable) cardiac defibrillator: Secondary | ICD-10-CM

## 2021-03-07 MED ORDER — APIXABAN 5 MG PO TABS
5.0000 mg | ORAL_TABLET | Freq: Two times a day (BID) | ORAL | 1 refills | Status: DC
Start: 2021-03-07 — End: 2021-08-26

## 2021-03-07 MED ORDER — ENTRESTO 24-26 MG PO TABS
1.0000 | ORAL_TABLET | Freq: Two times a day (BID) | ORAL | 1 refills | Status: DC
Start: 2021-03-07 — End: 2022-02-06

## 2021-03-07 MED ORDER — AMIODARONE HCL 200 MG PO TABS
100.0000 mg | ORAL_TABLET | Freq: Every day | ORAL | 1 refills | Status: DC
Start: 2021-03-07 — End: 2022-01-02

## 2021-03-07 MED ORDER — SPIRONOLACTONE 25 MG PO TABS
25.0000 mg | ORAL_TABLET | Freq: Every day | ORAL | 1 refills | Status: DC
Start: 2021-03-07 — End: 2021-05-31

## 2021-03-07 MED ORDER — CARVEDILOL 12.5 MG PO TABS: 12.5000 mg | ORAL_TABLET | Freq: Two times a day (BID) | ORAL | 1 refills | Status: DC

## 2021-03-08 ENCOUNTER — Telehealth: Payer: Self-pay | Admitting: Physical Therapy

## 2021-03-08 ENCOUNTER — Ambulatory Visit: Payer: Medicare Other | Admitting: Physical Therapy

## 2021-03-08 NOTE — Telephone Encounter (Signed)
Patient was called following no-show, message left on cell phone voice mail. Informed of missed visit and next visit for [facility] 6/16 at 1130.

## 2021-03-10 ENCOUNTER — Encounter: Payer: Self-pay | Admitting: Physical Therapy

## 2021-03-10 ENCOUNTER — Ambulatory Visit: Payer: Medicare Other | Admitting: Physical Therapy

## 2021-03-10 ENCOUNTER — Other Ambulatory Visit: Payer: Self-pay

## 2021-03-10 DIAGNOSIS — M6281 Muscle weakness (generalized): Secondary | ICD-10-CM

## 2021-03-10 DIAGNOSIS — R262 Difficulty in walking, not elsewhere classified: Secondary | ICD-10-CM | POA: Diagnosis not present

## 2021-03-10 NOTE — Therapy (Addendum)
[place], [name], [address] Phone: [phone]   Fax:  [phone]  Physical Therapy Treatment/DISCHARGE SUMMARY  Patient Details  Name: Dean Allsbrook Sr. MRN: [id number] Date of Birth: 1951-06-13 Referring Provider (PT): Earnie Larsson, MD   Encounter Date: 03/10/2021   PT End of Session - 03/10/21 1129     Visit Number 10    Number of Visits 32    Date for PT Re-Evaluation 04/12/21    Authorization Time Period FOTO visit 6, 10; [place] Modifier Visit #15    Progress Note Due on Visit 18    PT Start Time 8110    PT Stop Time 1214    PT Time Calculation (min) 43 min    Activity Tolerance Patient tolerated treatment well;No increased pain    Behavior During Therapy [facility] for tasks assessed/performed             Past Medical History:  Diagnosis Date   AICD (automatic cardioverter/defibrillator) present 10/10/2016   Arthritis    "maybe in his spine" (09/05/2016)   Asthma    pt. denies   Cardiac arrest ([place]) 08/27/2016   REQUIRING CPR, SHOCK & MEDICATIONS   CHF (congestive heart failure) ([facility])    Coronary artery disease    90% ostRCA stenosis and total occlusion of dRCA with L-->R collaterals 2017   Dysrhythmia    Gout    High cholesterol    Hypertension    Ischemic cardiomyopathy    LBBB (left bundle branch block)    OSA on CPAP    does not wear CPAP   Type II diabetes mellitus ([place])     Past Surgical History:  Procedure Laterality Date   CARDIAC CATHETERIZATION N/A 09/07/2016   Procedure: Left Heart Cath and Coronary Angiography;  Surgeon: Peter M Martinique, MD;  Location: [facility];  Service: Cardiovascular;  Laterality: N/A;   COLONOSCOPY WITH PROPOFOL N/A 08/30/2018   Procedure: COLONOSCOPY WITH PROPOFOL;  Surgeon: Carol Ada, MD;  [facility];  Service: Endoscopy;  Laterality: N/A;   EP IMPLANTABLE DEVICE N/A 10/10/2016   Procedure: ICD Implant;  Surgeon: Will Meredith Leeds, MD;  Location: [facility];  Service: Cardiovascular;  Laterality: N/A;   LUMBAR LAMINECTOMY/DECOMPRESSION MICRODISCECTOMY N/A 01/18/2018   Procedure: L2-3, L3-4, L-4-5 CENTRAL DECOMPRESSION;  Surgeon: [name], MD;  Location: [facility];  Service: Orthopedics;  Laterality: N/A;   LUMBAR LAMINECTOMY/DECOMPRESSION MICRODISCECTOMY N/A 08/12/2020   Procedure: THORACIC ELEVEN LAMINECTOMY;  Surgeon: Earnie Larsson, MD;  Location: [facility];  Service: Neurosurgery;  Laterality: N/A;   POLYPECTOMY  08/30/2018   Procedure: POLYPECTOMY;  Surgeon: Carol Ada, MD;  [facility];  Service: Endoscopy;;    There were no vitals filed for this visit.   Subjective Assessment - 03/10/21 1340     Subjective Patient reports he's walking more at home. Patient reports things getting better and better, but some day's I'm stiff in the morning still.    Limitations Lifting;Standing;Walking;House hold activities    Currently in Pain? No/denies                [facility] PT Assessment - 03/10/21 0001       Assessment   Medical Diagnosis S22.088A (ICD-10-CM) - Other fracture of t11-T12 vertebra, initial encounter for closed fracture    Referring Provider (PT) Earnie Larsson, MD    Onset Date/Surgical Date 06/28/20      Precautions   Precautions None  Strength   Overall Strength Comments ABD R=3- L=2+; Hip flex R/L=4/5; Knees grossly 4+/5      Transfers   Transfers Sit to Stand;Stand to Sit    Five time sit to stand comments  22 sec    Squat Pivot Transfers 5: Supervision      6 minute walk test results    Aerobic Endurance Distance Walked 275    Endurance additional comments RW 62mn20sec      Berg Balance Test   Sit to Stand Able to stand using hands after several tries    Standing Unsupported Able to stand 2 minutes with supervision    Sitting with Back Unsupported but Feet Supported on Floor or Stool Able to sit 2 minutes under supervision    Stand to Sit Controls descent by  using hands    Transfers Able to transfer with verbal cueing and /or supervision    Standing Unsupported with Eyes Closed Able to stand 10 seconds safely    Standing Unsupported with Feet Together Able to place feet together independently and stand for 1 minute with supervision    From Standing, Reach Forward with Outstretched Arm Can reach forward >12 cm safely (5")    From Standing Position, Pick up Object from Floor Unable to pick up and needs supervision    From Standing Position, Turn to Look Behind Over each Shoulder Turn sideways only but maintains balance    Turn 360 Degrees Needs close supervision or verbal cueing    Standing Unsupported, Alternately Place Feet on Step/Stool Needs assistance to keep from falling or unable to try    Standing Unsupported, One Foot in Front Able to take small step independently and hold 30 seconds    Standing on One Leg Unable to try or needs assist to prevent fall    Total Score 29                           [facility] Adult PT Treatment/Exercise - 03/10/21 0001       Knee/Hip Exercises: Seated   Clamshell with TheraBand Red   2x10                   PT Education - 03/10/21 1357     Education Details Continue to encourage use of AD at home to reduce risk of falls.    Person(s) Educated Patient    Methods Explanation;Demonstration;Verbal cues    Comprehension Verbalized understanding;Returned demonstration              PT Short Term Goals - 02/15/21 1240       PT SHORT TERM GOAL #1   Status Achieved      PT SHORT TERM GOAL #2   Title Pt will decrease 5XSTS to <30 seconds.    Status Achieved      PT SHORT TERM GOAL #3   Title Pt will increase gait distance during 6MWT to 300 ft    Status On-going               PT Long Term Goals - 02/15/21 1332       PT LONG TERM GOAL #1   Title Pt will be independent with long term HEP for continued strength and endurance training to maintain mobility.    Status  On-going    Target Date 04/12/21      PT LONG TERM GOAL #2   Title Pt will decrease 5xSTS time to </=  15 seconds to demonstrate improvement in lower extremity power and strength for decreased fall risk.    Baseline 48 sec at IE 12/25/7406, 25 sec at recert 1/44/8185    Status On-going      PT LONG TERM GOAL #3   Title Pt will demonstrate at least 4+/5 lower extremity MMT.    Status On-going    Target Date 04/12/21      PT LONG TERM GOAL #4   Title Pt will increase gait distance during 6MWT to 1831 feet, per age related norms to decrease fall risk and initiate community mobility.    Status On-going    Target Date 04/12/21      PT LONG TERM GOAL #5   Title Pt will improve FOTO ability to at least 60% to demonstrate meaningful change and improvement in perceived level of functional ability.    Baseline 51% 12/24/2020; 44% 02/14/2021    Status On-going    Target Date 04/12/21      Additional Long Term Goals   Additional Long Term Goals Yes      PT LONG TERM GOAL #6   Title Patient will improve BERG BALANCE score to > 45 for reduced risk of falls with functional mobility.    Baseline BERG=21 02/14/2021    Time 8    Period Weeks    Status New    Target Date 04/12/21                   Plan - 03/10/21 1358     Clinical Impression Statement Patient improving with gait and strength, continues to be quite limited with hip abduction and requires UE support for transfers and safe ambulation. Patient would benefit from continued skilled PT to address deficits and maximize patient goal of return to walking in the community    Comorbidities HLD, HTN, arthritis, DM, CHF,    Examination-Activity Limitations Bathing;Locomotion Level;Transfers;Bend;Lift;Stand;Stairs;Squat    Examination-Participation Restrictions Community Activity;Interpersonal Relationship;Meal Prep    PT Treatment/Interventions ADLs/Self Care Home Management;Aquatic Therapy;Cryotherapy;Electrical  Stimulation;Iontophoresis 70m/ml Dexamethasone;Moist Heat;Gait training;Stair training;Functional mobility training;Therapeutic activities;Therapeutic exercise;Balance training;Neuromuscular re-education;Patient/family education;Manual techniques;Passive range of motion    PT Next Visit Plan Assess response to HEP. LE strengthening and endurance. Continue standing exercises and balance as pt tolerates. 628m walk test.  Post chain strengthening and hip abduction strengthening.    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[facility]11/18/2021   Atrial fibrillation, chronic (HC[facility]11/18/2021   Thoracic spine fracture (HCSanta Rosa10/01/2020   Hyperkalemia 10/23/2019   Diarrhea 10/23/2019   CHF (congestive heart failure) (HCPlan[facility]ville0963/14/9702 Acute systolic CHF (congestive heart failure) (HCPierp[facility]09/05/2019   Sepsis (HC[facility]08/07/2019   Acute asthma exacerbation 05/06/2019   Acute respiratory distress 05/06/2019   Acute respiratory failure with hypoxia (HCDe[facility]ville07/08/2019   HCAP (healthcare-associated pneumonia) 04/06/2019   Acute kidney failure (HCGren[facility]06/12/2018   Chronic combined systolic and diastolic CHF (congestive heart failure) (HCMayer  02/27/2019   CKD (chronic kidney disease), stage III ([place]) 05/16/2018   Normochromic normocytic anemia 05/15/2018   CAD (coronary artery disease) 12/17/2017   Hyperlipidemia LDL  goal <70 12/17/2017   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   Cardiomyopathy, ischemic    Debility 10/04/2016   Stage 3 chronic kidney disease ([place])    Disorientated    Anoxic brain injury ([place]) 09/15/2016   Benign essential HTN    OSA (obstructive sleep apnea)    Transaminitis    Chronic respiratory failure with hypoxia (H[facility])    Fever    NSVT (nonsustained ventricular tachycardia) ([place])    Uncomplicated asthma    Diabetes mellitus type 2 in obese ([place])    Acute on chronic combined systolic and diastolic CHF (congestive heart failure) (H[facility])    Tachypnea    Hypokalemia    Leukocytosis    Hypoxemia    Acute respiratory failure ([place])    Cardiopulmonary arrest ([place]) 08/27/2016   Class 2 obesity due to excess calories with body mass index (BMI) of 35.0 to 35.9 in adult 03/02/2014   Essential hypertension, benign 03/02/2014    Pollyann Samples, PT 03/10/2021, 2:26 PM  [facility] [address] [place], [name], [address] Phone: [phone]   Fax:  [phone]  Name: Dean Creelman Sr. MRN: [id number] Date of Birth: 1951-01-09  PHYSICAL THERAPY DISCHARGE SUMMARY  Visits from Start of Care: 10  Current functional level related to goals / functional outcomes: Some progress toward functional goals, did not finish program.   Remaining deficits: See assessment above.   Education / Equipment: Progressive HEP. Recommended patient continue after discharge.  Patient agrees to discharge. Patient goals were not met. Patient is being discharged due to the patient's request. PT called patient after multiple cancellations.  Patient agrees that this is not a good time to participate consistently and will look to get a new referral when he is better able to attend. Patient will be discharged today.   Pollyann Samples, PT

## 2021-03-11 ENCOUNTER — Ambulatory Visit (INDEPENDENT_AMBULATORY_CARE_PROVIDER_SITE_OTHER): Payer: Medicare Other

## 2021-03-11 DIAGNOSIS — Z95810 Presence of automatic (implantable) cardiac defibrillator: Secondary | ICD-10-CM

## 2021-03-11 DIAGNOSIS — I5023 Acute on chronic systolic (congestive) heart failure: Secondary | ICD-10-CM

## 2021-03-11 LAB — CUP PACEART REMOTE DEVICE CHECK
Battery Remaining Longevity: 85 mo
Battery Voltage: 2.99 V
Brady Statistic RV Percent Paced: 0.01 %
Date Time Interrogation Session: 20220617033325
HighPow Impedance: 69 Ohm
Implantable Lead Implant Date: 20180116
Implantable Lead Location: 753860
Implantable Lead Model: 6935
Implantable Pulse Generator Implant Date: 20180116
Lead Channel Impedance Value: 304 Ohm
Lead Channel Impedance Value: 380 Ohm
Lead Channel Pacing Threshold Amplitude: 0.625 V
Lead Channel Pacing Threshold Pulse Width: 0.4 ms
Lead Channel Sensing Intrinsic Amplitude: 14.625 mV
Lead Channel Sensing Intrinsic Amplitude: 14.625 mV
Lead Channel Setting Pacing Amplitude: 2.5 V
Lead Channel Setting Pacing Pulse Width: 0.4 ms
Lead Channel Setting Sensing Sensitivity: 0.3 mV

## 2021-03-14 ENCOUNTER — Ambulatory Visit (INDEPENDENT_AMBULATORY_CARE_PROVIDER_SITE_OTHER): Payer: Medicare Other

## 2021-03-14 DIAGNOSIS — I5022 Chronic systolic (congestive) heart failure: Secondary | ICD-10-CM | POA: Diagnosis not present

## 2021-03-14 DIAGNOSIS — Z95810 Presence of automatic (implantable) cardiac defibrillator: Secondary | ICD-10-CM | POA: Diagnosis not present

## 2021-03-15 NOTE — Progress Notes (Signed)
EPIC Encounter for ICM Monitoring  Patient Name: Dean Kersten Sr. is a 70 y.o. male Date: 03/15/2021 Primary Care Physican: [name], MD Primary Cardiologist: [facility] Electrophysiologist: Curt Bears 03/15/2021 Weight: 197 lbs     Time in AF   0.0 hr/day (0.0%) (taking Eliquis)   Spoke with patient and reports feeling well at this time. Heart failure questions reviewed. Pt asymptomatic.   Optivol Thoracic impedance fluid levels returned to normal after several months of possible fluid accumlation.   Prescribed:  Furosemide 40 mg Take 1 tablet (40 mg total) by mouth twice a day. Spironolactone 25 mg take 1 tablet daily Eliquis 5 mg take 1 tablet twice a day   Labs: 12/10/2020 Creatinine 1.94, BUN 18, Potassium 4.3, Sodium 141, GFR 39 11/02/2020 Creatinine 1.38, BUN 16, Potassium 4.1, Sodium 141, GFR 51-59 A complete set of results can be found in Results Review.   Recommendations: No changes and encouraged to call if experiencing any fluid symptoms.   Follow-up plan: ICM clinic phone appointment on 04/25/2021.   91 day device clinic remote transmission 06/10/2021.     EP/Cardiology Office Visits:   Recall 09/16/2021 with Dr Curt Bears.   Copy of ICM check sent to Dr. Curt Bears   3 month ICM trend: 03/14/2021.    1 Year ICM trend:       Rosalene Billings, RN 03/15/2021 3:25 PM

## 2021-03-17 ENCOUNTER — Ambulatory Visit: Payer: Medicare Other | Admitting: Physical Therapy

## 2021-03-22 ENCOUNTER — Ambulatory Visit: Payer: Medicare Other | Admitting: Physical Therapy

## 2021-03-24 ENCOUNTER — Ambulatory Visit: Payer: Medicare Other | Admitting: Physical Therapy

## 2021-03-24 ENCOUNTER — Telehealth: Payer: Self-pay | Admitting: Physical Therapy

## 2021-03-24 NOTE — Telephone Encounter (Signed)
Attempted to call patient following missed appointment, no voice mail.

## 2021-03-29 ENCOUNTER — Telehealth: Payer: Self-pay | Admitting: Physical Therapy

## 2021-03-29 ENCOUNTER — Ambulatory Visit: Payer: Medicare Other | Attending: Neurosurgery | Admitting: Physical Therapy

## 2021-03-29 NOTE — Telephone Encounter (Signed)
PT called patient after second consecutive no-show.  Patient agrees that this is not a good time to participate consistently and will look to get a new referral when he is better able to attend. Patient will be discharged today.

## 2021-03-29 NOTE — Progress Notes (Signed)
Remote ICD transmission.   

## 2021-04-25 ENCOUNTER — Ambulatory Visit (INDEPENDENT_AMBULATORY_CARE_PROVIDER_SITE_OTHER): Payer: Medicare Other

## 2021-04-25 ENCOUNTER — Telehealth: Payer: Self-pay

## 2021-04-25 DIAGNOSIS — Z95810 Presence of automatic (implantable) cardiac defibrillator: Secondary | ICD-10-CM | POA: Diagnosis not present

## 2021-04-25 DIAGNOSIS — I5022 Chronic systolic (congestive) heart failure: Secondary | ICD-10-CM | POA: Diagnosis not present

## 2021-04-25 NOTE — Telephone Encounter (Signed)
Remote ICM transmission received.  Attempted call to patient regarding ICM remote transmission and left message to return call   

## 2021-04-25 NOTE — Progress Notes (Signed)
EPIC Encounter for ICM Monitoring  Patient Name: Dean Cerino Sr. is a 70 y.o. male Date: 04/25/2021 Primary Care Physican: [name], MD Primary Cardiologist: [facility] Electrophysiologist: Curt Bears 03/15/2021 Weight: 197 lbs     Time in AF   <0.1 hr/day (<0.1%) (taking Eliquis)   Attempted call to patient and unable to reach.  Left message to return call. Transmission reviewed.    Optivol Thoracic impedance suggesting possible fluid accumulation starting 6/15.  Impedance pattern continues to suggesting possible fluid accumulation since December 2021 with exception of few days at baseline normal.   Prescribed:  Furosemide 40 mg Take 1 tablet (40 mg total) by mouth twice a day. Spironolactone 25 mg take 1 tablet daily Eliquis 5 mg take 1 tablet twice a day   Labs: 12/10/2020 Creatinine 1.94, BUN 18, Potassium 4.3, Sodium 141, GFR 39 11/02/2020 Creatinine 1.38, BUN 16, Potassium 4.1, Sodium 141, GFR 51-59 A complete set of results can be found in Results Review.   Recommendations:  Unable to reach.  Copy sent to Dr Debara Pickett for review and recommendations if needed.   Follow-up plan: ICM clinic phone appointment on 05/03/2021 to recheck fluid levels.   91 day device clinic remote transmission 06/10/2021.     EP/Cardiology Office Visits:   Recall 08/08/2021 with Dr Debara Pickett.  Recall 09/16/2021 with Dr Curt Bears.   Copy of ICM check sent to Dr. Curt Bears.   3 month ICM trend: 04/25/2021.    1 Year ICM trend:       Rosalene Billings, RN 04/25/2021 12:34 PM

## 2021-04-26 NOTE — Telephone Encounter (Signed)
Attempted return call after patient left message.  No answer and left message was returning call.

## 2021-04-29 NOTE — Progress Notes (Signed)
Received: Today Hilty, Nadean Corwin, MD  Dean Hepburn Panda, RN; Fidel Levy, RN Thanks .. we reached out to him last time (hard to reach) and he had no signs of symptoms of CHF - will try to reach him again.   Dr. Lemmie Evens

## 2021-05-03 ENCOUNTER — Ambulatory Visit (INDEPENDENT_AMBULATORY_CARE_PROVIDER_SITE_OTHER): Payer: Medicare Other

## 2021-05-03 DIAGNOSIS — Z95810 Presence of automatic (implantable) cardiac defibrillator: Secondary | ICD-10-CM

## 2021-05-03 DIAGNOSIS — I5022 Chronic systolic (congestive) heart failure: Secondary | ICD-10-CM

## 2021-05-03 NOTE — Telephone Encounter (Signed)
Left message to call back  

## 2021-05-04 NOTE — Progress Notes (Signed)
EPIC Encounter for ICM Monitoring  Patient Name: Dean Lagrimas Sr. is a 70 y.o. male Date: 05/04/2021 Primary Care Physican: [name], MD Primary Cardiologist: [facility] Electrophysiologist: Curt Bears 03/15/2021 Weight: 197 lbs     Time in AF  0.0 hr/day (0.0%) (taking Eliquis)   Attempted call to patient and unable to reach.  Transmission reviewed.   In the past, pt has had no symptoms despite Optivol reading suggesting fluid.  Dr [facility] office attempted to reach patient by phone after 8/1 remote transmission.  Pt difficult to reach and not actively participating in monthly ICM follow up.     Optivol Thoracic impedance suggesting possible fluid accumulation starting 6/15.  Impedance pattern continues to suggest possible fluid accumulation since December 2021 with exception of few days at baseline normal. 02/08/2021 OV note by Coletta Memos, NP states patient euvolemic at office visit and Optivol has been labile and not accuate at times.   Prescribed:  Furosemide 40 mg Take 1 tablet (40 mg total) by mouth twice a day. Spironolactone 25 mg take 1 tablet daily Eliquis 5 mg take 1 tablet twice a day   Labs: 12/10/2020 Creatinine 1.94, BUN 18, Potassium 4.3, Sodium 141, GFR 39 11/02/2020 Creatinine 1.38, BUN 16, Potassium 4.1, Sodium 141, GFR 51-59 A complete set of results can be found in Results Review.   Recommendations:  Unable to reach patient for monthly ICM follow up.  Will disenroll from ICM at this time.  If patient interested in participation in the future will enroll again.    Follow-up plan: No further ICM follow up at this time.   91 day device clinic remote transmission 06/10/2021.     EP/Cardiology Office Visits:   Recall 08/08/2021 with Dr Debara Pickett.  Recall 09/16/2021 with Dr Curt Bears.   Copy of ICM check sent to Dr. Curt Bears.   3 month ICM trend: 05/03/2021.    1 Year ICM trend:       Rosalene Billings, RN 05/04/2021 8:02 AM

## 2021-05-27 ENCOUNTER — Other Ambulatory Visit: Payer: Self-pay | Admitting: Internal Medicine

## 2021-06-06 ENCOUNTER — Telehealth: Payer: Self-pay

## 2021-06-06 NOTE — Telephone Encounter (Signed)
Patient called and would like to resume monthly ICM follow monitoring.  He said he has been working more and missing some ICM calls.  Best time to reach him is 8-8:30 AM for follow up.  Advised when he returns a call to leave a message regarding the best time to reach him.  He has been doing okay and does not currently have any fluid symptoms.  Will resume monthly ICM follow up at patient's request starting 9/19.

## 2021-06-10 ENCOUNTER — Ambulatory Visit (INDEPENDENT_AMBULATORY_CARE_PROVIDER_SITE_OTHER): Payer: Medicare Other

## 2021-06-10 DIAGNOSIS — I5023 Acute on chronic systolic (congestive) heart failure: Secondary | ICD-10-CM | POA: Diagnosis not present

## 2021-06-12 LAB — CUP PACEART REMOTE DEVICE CHECK
Battery Remaining Longevity: 79 mo
Battery Voltage: 2.99 V
Brady Statistic RV Percent Paced: 0.07 %
Date Time Interrogation Session: 20220916043826
HighPow Impedance: 58 Ohm
Implantable Lead Implant Date: 20180116
Implantable Lead Location: 753860
Implantable Lead Model: 6935
Implantable Pulse Generator Implant Date: 20180116
Lead Channel Impedance Value: 266 Ohm
Lead Channel Impedance Value: 342 Ohm
Lead Channel Pacing Threshold Amplitude: 0.75 V
Lead Channel Pacing Threshold Pulse Width: 0.4 ms
Lead Channel Sensing Intrinsic Amplitude: 10.375 mV
Lead Channel Sensing Intrinsic Amplitude: 10.375 mV
Lead Channel Setting Pacing Amplitude: 2.5 V
Lead Channel Setting Pacing Pulse Width: 0.4 ms
Lead Channel Setting Sensing Sensitivity: 0.3 mV

## 2021-06-13 ENCOUNTER — Telehealth: Payer: Self-pay

## 2021-06-13 NOTE — Telephone Encounter (Signed)
Returned patient call as requested. Pt spoke with [employer] tech service number and battery on the monitor will be replaced.  The new reader should arrive in 7 to 10 days.  Advised to send a manual transmission once he receives the new reader.  He said he is feeling fine right now and denies any fluid symptoms.

## 2021-06-13 NOTE — Telephone Encounter (Signed)
ICM Call to patient to request remote transmission for fluid level review.  Attempted to assist with manual transmission and monitor showed error message.  Provided [employer] Tech support number and encouraged to call for assistance to send remote transmission.

## 2021-06-15 NOTE — Progress Notes (Signed)
Remote ICD transmission.   

## 2021-06-21 ENCOUNTER — Telehealth: Payer: Self-pay

## 2021-06-21 NOTE — Telephone Encounter (Signed)
Returned pt call per voice mail request.  Pt received new [employer] handset and asked if remote transmission was received last week and how were fluid levels.  9/22 remote transmission reviewed and advised fluid levels have improved.  He reports feeling fine and does not have any fluid symptoms at this time.  Advised next ICM remote transmission scheduled for 10/3.

## 2021-06-22 ENCOUNTER — Telehealth: Payer: Self-pay

## 2021-06-22 NOTE — Telephone Encounter (Signed)
Spoke with pt, transmission received does show elevated optivol.  Thoracic impedence is trending down.     Pt meds include Lasix 40mg  BID (pt reports he currently takes 80mg  daily).   Spironolactone 25mg  daily.  Pt is followed in ICM clinic.  He v/u to watch sodium levels and continue with meds as ordered.  Pt denies any cardiac symptoms at this time.  Advised next rtasnmission due 10/3 for follow-up.

## 2021-06-22 NOTE — Telephone Encounter (Signed)
Patient called in wanting to know if someone can look at his recent transmission he thinks he has fluid build up.

## 2021-06-27 ENCOUNTER — Ambulatory Visit (INDEPENDENT_AMBULATORY_CARE_PROVIDER_SITE_OTHER): Payer: Medicare Other

## 2021-06-27 DIAGNOSIS — Z95810 Presence of automatic (implantable) cardiac defibrillator: Secondary | ICD-10-CM

## 2021-06-27 DIAGNOSIS — I5022 Chronic systolic (congestive) heart failure: Secondary | ICD-10-CM

## 2021-06-27 NOTE — Progress Notes (Signed)
EPIC Encounter for ICM Monitoring  Patient Name: Dean Schank Sr. is a 70 y.o. male Date: 06/27/2021 Primary Care Physican: [name], MD Primary Cardiologist: [facility] Electrophysiologist: Curt Bears 06/27/2021 Weight: 197 lbs     Time in AF  0.0 hr/day (0.0%) (taking Eliquis)   Spoke with patient and heart failure questions reviewed.  Pt asymptomatic for fluid accumulation and feeling well.  He denies fluid symptoms such swelling of feet and legs.   Optivol Thoracic impedance suggesting improved fluid levels and trending close to baseline.    Prescribed:  Furosemide 40 mg Take 1 tablet (40 mg total) by mouth twice a day.  06/27/2021 Pt taking differently prescribed by PCP.  He is taking Furosemide 80 mg take 1 tablet daily which is showing in med reconciling list Spironolactone 25 mg take 1 tablet daily Eliquis 5 mg take 1 tablet twice a day   Labs: 12/10/2020 Creatinine 1.94, BUN 18, Potassium 4.3, Sodium 141, GFR 39 11/02/2020 Creatinine 1.38, BUN 16, Potassium 4.1, Sodium 141, GFR 51-59 A complete set of results can be found in Results Review.   Recommendations:  Recommendation to limit salt intake and fluid intake.  Encouraged to call if experiencing any fluid symptoms.    Follow-up plan: No further ICM follow up 08/01/2021.   91 day device clinic remote transmission 09/09/2021.     EP/Cardiology Office Visits:   Recall 08/08/2021 with Dr Debara Pickett.  Recall 09/16/2021 with Dr Curt Bears.   Copy of ICM check sent to Dr. Curt Bears.    3 month ICM trend: 06/27/2021.    1 Year ICM trend:       Rosalene Billings, RN 06/27/2021 8:27 AM

## 2021-08-01 ENCOUNTER — Ambulatory Visit (INDEPENDENT_AMBULATORY_CARE_PROVIDER_SITE_OTHER): Payer: Medicare Other

## 2021-08-01 DIAGNOSIS — I5022 Chronic systolic (congestive) heart failure: Secondary | ICD-10-CM | POA: Diagnosis not present

## 2021-08-01 DIAGNOSIS — Z95810 Presence of automatic (implantable) cardiac defibrillator: Secondary | ICD-10-CM | POA: Diagnosis not present

## 2021-08-03 NOTE — Progress Notes (Signed)
EPIC Encounter for ICM Monitoring  Patient Name: Dean Cislo Sr. is a 70 y.o. male Date: 08/03/2021 Primary Care Physican: [name], MD Primary Cardiologist: [facility] Electrophysiologist: Curt Bears 08/03/2021 Weight: 197 lbs     Time in AF  0.0 hr/day (0.0%) (taking Eliquis)   Spoke with patient and heart failure questions reviewed.  Pt asymptomatic for fluid accumulation and feeling well.  He denies fluid symptoms such swelling of feet and legs.   Optivol Thoracic impedance suggesting normal fluid levels but was suggesting possible fluid accumulation from 8/28-10/29.   Prescribed:  Furosemide 40 mg Take 1 tablet (40 mg total) by mouth twice a day.  06/27/2021 Pt taking differently prescribed by PCP.  He is taking Furosemide 80 mg take 1 tablet daily which is showing in med reconciling list Spironolactone 25 mg take 1 tablet daily Eliquis 5 mg take 1 tablet twice a day   Labs: 12/10/2020 Creatinine 1.94, BUN 18, Potassium 4.3, Sodium 141, GFR 39 11/02/2020 Creatinine 1.38, BUN 16, Potassium 4.1, Sodium 141, GFR 51-59 A complete set of results can be found in Results Review.   Recommendations:  Recommendation to limit salt intake and fluid intake.  Encouraged to call if experiencing any fluid symptoms.    Follow-up plan: No further ICM follow up 09/05/2021.   91 day device clinic remote transmission 09/09/2021.     EP/Cardiology Office Visits:   Recall 08/08/2021 with Dr Debara Pickett.  Recall 09/16/2021 with Dr Curt Bears.  Advised to call Dr [facility] office for 6 month f/u appointment.   Copy of ICM check sent to Dr. Curt Bears.     3 month ICM trend: 08/01/2021.    1 Year ICM trend:       Rosalene Billings, RN 08/03/2021 1:54 PM

## 2021-08-26 ENCOUNTER — Other Ambulatory Visit: Payer: Self-pay | Admitting: Internal Medicine

## 2021-08-26 NOTE — Telephone Encounter (Signed)
Prescription refill request for Eliquis received. Indication:Afib Last office visit:5/22 Scr:1.9 Age: 70 Weight:90.6 kg  Prescription refilled

## 2021-09-05 ENCOUNTER — Ambulatory Visit (INDEPENDENT_AMBULATORY_CARE_PROVIDER_SITE_OTHER): Payer: Medicare Other

## 2021-09-05 DIAGNOSIS — I5022 Chronic systolic (congestive) heart failure: Secondary | ICD-10-CM

## 2021-09-05 DIAGNOSIS — Z95810 Presence of automatic (implantable) cardiac defibrillator: Secondary | ICD-10-CM | POA: Diagnosis not present

## 2021-09-07 ENCOUNTER — Telehealth: Payer: Self-pay

## 2021-09-07 NOTE — Progress Notes (Signed)
EPIC Encounter for ICM Monitoring  Patient Name: Dean Jowers Sr. is a 70 y.o. male Date: 09/07/2021 Primary Care Physican: [name], MD Primary Cardiologist: [facility] Electrophysiologist: Curt Bears 09/07/2021 Weight: 195 lbs     Time in AF   0.5 hr/day (2.2%) (taking Eliquis)   Spoke with patient and heart failure questions reviewed.  Pt asymptomatic for fluid accumulation and feeling well.   Optivol Thoracic impedance suggesting normal fluid levels.   Prescribed:  Furosemide 40 mg Take 1 tablet (40 mg total) by mouth twice a day.  06/27/2021 Pt taking differently prescribed by PCP.  He is taking Furosemide 80 mg take 1 tablet daily which is showing in med reconciling list Spironolactone 25 mg take 1 tablet daily Eliquis 5 mg take 1 tablet twice a day   Labs: 12/10/2020 Creatinine 1.94, BUN 18, Potassium 4.3, Sodium 141, GFR 39 11/02/2020 Creatinine 1.38, BUN 16, Potassium 4.1, Sodium 141, GFR 51-59 A complete set of results can be found in Results Review.   Recommendations:  No changes and encouraged to call if experiencing any fluid symptoms.   Follow-up plan: No further ICM follow up 10/10/2021.   91 day device clinic remote transmission 09/09/2021.     EP/Cardiology Office Visits:   Recall 08/08/2021 with Dr Debara Pickett.  Recall 09/16/2021 with Dr Curt Bears.     Copy of ICM check sent to Dr. Curt Bears.     3 month ICM trend: 09/05/2021.    12-14 Month ICM trend:       Rosalene Billings, RN 09/07/2021 12:33 PM

## 2021-09-07 NOTE — Telephone Encounter (Signed)
Remote ICM transmission received.  Attempted call to patient regarding ICM remote transmission and left detailed message per DPR.  Advised to return call for any fluid symptoms or questions. Next ICM remote transmission scheduled 10/10/2021.

## 2021-09-09 ENCOUNTER — Ambulatory Visit (INDEPENDENT_AMBULATORY_CARE_PROVIDER_SITE_OTHER): Payer: Medicare Other

## 2021-09-09 DIAGNOSIS — I255 Ischemic cardiomyopathy: Secondary | ICD-10-CM

## 2021-09-09 LAB — CUP PACEART REMOTE DEVICE CHECK
Battery Remaining Longevity: 67 mo
Battery Voltage: 2.98 V
Brady Statistic RV Percent Paced: 0.04 %
Date Time Interrogation Session: 20221216043824
HighPow Impedance: 65 Ohm
Implantable Lead Implant Date: 20180116
Implantable Lead Location: 753860
Implantable Lead Model: 6935
Implantable Pulse Generator Implant Date: 20180116
Lead Channel Impedance Value: 304 Ohm
Lead Channel Impedance Value: 380 Ohm
Lead Channel Pacing Threshold Amplitude: 0.625 V
Lead Channel Pacing Threshold Pulse Width: 0.4 ms
Lead Channel Sensing Intrinsic Amplitude: 13.125 mV
Lead Channel Sensing Intrinsic Amplitude: 13.125 mV
Lead Channel Setting Pacing Amplitude: 2.5 V
Lead Channel Setting Pacing Pulse Width: 0.4 ms
Lead Channel Setting Sensing Sensitivity: 0.3 mV

## 2021-09-21 NOTE — Progress Notes (Signed)
Remote ICD transmission.   

## 2021-10-10 ENCOUNTER — Ambulatory Visit (INDEPENDENT_AMBULATORY_CARE_PROVIDER_SITE_OTHER): Payer: Medicare Other

## 2021-10-10 ENCOUNTER — Telehealth: Payer: Self-pay

## 2021-10-10 DIAGNOSIS — I5022 Chronic systolic (congestive) heart failure: Secondary | ICD-10-CM

## 2021-10-10 DIAGNOSIS — Z95810 Presence of automatic (implantable) cardiac defibrillator: Secondary | ICD-10-CM | POA: Diagnosis not present

## 2021-10-10 NOTE — Progress Notes (Signed)
EPIC Encounter for ICM Monitoring  Patient Name: Dean Reznick Sr. is a 71 y.o. male Date: 10/10/2021 Primary Care Physican: [name], MD Primary Cardiologist: [facility] Electrophysiologist: Curt Bears 09/07/2021 Weight: 195 lbs    Time in AF          0.2 hr/day (0.7%) (taking Eliquis)   Attempted call to patient and unable to reach.  Left detailed message per DPR regarding transmission. Transmission reviewed.    Optivol Thoracic impedance suggesting possible fluid accumulation starting 09/26/2021.   Prescribed:  Furosemide 40 mg Take 1 tablet (40 mg total) by mouth twice a day.  06/27/2021 Pt taking differently prescribed by PCP.  He is taking Furosemide 80 mg take 1 tablet daily which is showing in med reconciling list Spironolactone 25 mg take 1 tablet daily Eliquis 5 mg take 1 tablet twice a day   Labs: 12/10/2020 Creatinine 1.94, BUN 18, Potassium 4.3, Sodium 141, GFR 39 11/02/2020 Creatinine 1.38, BUN 16, Potassium 4.1, Sodium 141, GFR 51-59 A complete set of results can be found in Results Review.   Recommendations:  Left voice mail with ICM number and encouraged to call if experiencing any fluid symptoms.   Follow-up plan: No further ICM follow up 10/25/2021 to recheck fluid levels.   91 day device clinic remote transmission 12/09/2021.     EP/Cardiology Office Visits:   Recall 08/08/2021 with Dr Debara Pickett.  Recall 09/16/2021 with Dr Curt Bears.     Copy of ICM check sent to Dr. Curt Bears.   Will send copy to Dr Debara Pickett for review if patient is reached.   3 month ICM trend: 10/10/2021.    12-14 Month ICM trend:     Rosalene Billings, RN 10/10/2021 12:31 PM

## 2021-10-10 NOTE — Progress Notes (Signed)
Thanks .. agree with dietary recommendations. Monitor weight - if it starts to go up >2 lbs, would recommend lasix 80 mg BID for 3 days, then back to 80 mg daily  Dr Lemmie Evens

## 2021-10-10 NOTE — Telephone Encounter (Signed)
Remote ICM transmission received.  Attempted call to patient regarding ICM remote transmission and left detailed message per DPR to return call.   

## 2021-10-10 NOTE — Progress Notes (Signed)
Spoke with patient and heart failure questions reviewed.  Pt has a little swelling in feet and denies shortness of breath.  Weight is stable around 194 lbs.     Discussed limiting of salt and fluid intake.  He does not eat a lot of restaurant foods and does not typically use a salt shaker.  He has eaten some snack foods high in salt over the last few days.     Confirmed patient is compliant with taking Furosemide 80 mg take 1 tablet daily as prescribed by PCP.    Advised he is overdue to make routine appointments with Dr Debara Pickett and Dr Curt Bears and to call the office to schedule.   Copy sent to Dr Debara Pickett for review and recommendations if needed.

## 2021-10-11 ENCOUNTER — Telehealth: Payer: Self-pay

## 2021-10-11 NOTE — Progress Notes (Signed)
Spoke with patient and advised Dr Debara Pickett recommended to monitor weight and if has > 2 lb weight gain to take lasix 80 mg twice a day for 3 days only and then back to 80 mg daily.   Pt understands directions and will recheck fluid levels on 10/17/2021.  Advised importance of weighing every morning before eating or drinking and be consistent if weighing with or without clothes.  If weight increase by 2 lbs call back.

## 2021-10-11 NOTE — Telephone Encounter (Signed)
Called patient's number and received message "Your call cannot be completed at this time."

## 2021-10-11 NOTE — Telephone Encounter (Signed)
Patient c/o Palpitations:  High priority if patient c/o lightheadedness, shortness of breath, or chest pain  How long have you had palpitations/irregular HR/ Afib? Are you having the symptoms now?  Patient states for the past few months he has been experiencing palpitations on and off. No symptoms currently. He just wanted to follow up with device RN. States he has been having palpitations on and off.  Are you currently experiencing lightheadedness, SOB or CP?  No   Do you have a history of afib (atrial fibrillation) or irregular heart rhythm?  Yes   Have you checked your BP or HR? (document readings if available):  No readings available, but patient wanted to follow up with RN he spoke with yesterday. He states his HR has been fluctuating.  Are you experiencing any other symptoms?  No

## 2021-10-11 NOTE — Telephone Encounter (Signed)
LVM for patient to call device clinic back direct number given.

## 2021-10-11 NOTE — Telephone Encounter (Signed)
Called daughter DeeDee and LMTCB.

## 2021-10-12 NOTE — Telephone Encounter (Signed)
Attempted to contact patient and daughter to send a remote transmission. No answer, LMTCB.

## 2021-10-12 NOTE — Telephone Encounter (Signed)
Remote transmission reviewed. Normal device function. No events noted. Patient reports nothing was wrong he just could hear his heart beating in his ears. Advised patient his device does not reflect any episodes triggered but he can follow up with PCP in regards to complaint. Advised to call if further questions or concerns arise. Verbalized understanding.

## 2021-10-17 ENCOUNTER — Telehealth: Payer: Self-pay

## 2021-10-17 ENCOUNTER — Ambulatory Visit (INDEPENDENT_AMBULATORY_CARE_PROVIDER_SITE_OTHER): Payer: Medicare Other

## 2021-10-17 DIAGNOSIS — Z95810 Presence of automatic (implantable) cardiac defibrillator: Secondary | ICD-10-CM

## 2021-10-17 DIAGNOSIS — I5022 Chronic systolic (congestive) heart failure: Secondary | ICD-10-CM

## 2021-10-17 NOTE — Telephone Encounter (Signed)
Remote ICM transmission received.  Attempted call to patient regarding ICM remote transmission and left detailed message per DPR to return call.   

## 2021-10-17 NOTE — Progress Notes (Signed)
EPIC Encounter for ICM Monitoring  Patient Name: Dean Uselman Sr. is a 71 y.o. male Date: 10/17/2021 Primary Care Physican: [name], MD Primary Cardiologist: [facility] Electrophysiologist: Curt Bears 10/11/2021 Weight: 194 lbs 10/17/2021 Weight: 192 lbs    Time in AF      0.0 hr/day (0.0%) (taking Eliquis)   Spoke with patient and heart failure questions reviewed.  Pt asymptomatic for fluid accumulation.  Weighing daily and weight is stable.   He reports he can hear his heartbeat in his ears and wants to know what it is.  Advised he will need to discuss with physician regarding but does not have any palpitations.   He has restaurant foods about twice a week.   Optivol Thoracic impedance suggesting possible fluid accumulation starting 09/26/2021.   Prescribed:  Furosemide 40 mg Take 1 tablet (40 mg total) by mouth twice a day.  06/27/2021 Pt taking differently prescribed by PCP.  He is taking Furosemide 80 mg take 1 tablet daily which is showing in med reconciling list Spironolactone 25 mg take 1 tablet daily Eliquis 5 mg take 1 tablet twice a day   Labs: 12/10/2020 Creatinine 1.94, BUN 18, Potassium 4.3, Sodium 141, GFR 39 11/02/2020 Creatinine 1.38, BUN 16, Potassium 4.1, Sodium 141, GFR 51-59 A complete set of results can be found in Results Review.   Recommendations:  Encouraged to call if he develops any fluid levels.   Dr Debara Pickett recommended on 1/17 to monitor weight and if has > 2 lb weight gain to take lasix 80 mg twice a day for 3 days only and then back to 80 mg daily but weight is less today than previous ICM Call.   Follow-up plan: ICM clinic phone appointment on 10/25/2021 to recheck fluid levels.   91 day device clinic remote transmission 12/09/2021.     EP/Cardiology Office Visits:   12/05/2021 with Dr Debara Pickett.  01/31/2022 with Dr Curt Bears.     Copy of ICM check sent to Dr. Curt Bears.   Will send copy to Dr Debara Pickett.   3 month ICM trend: 10/17/2021.    12-14 Month ICM trend:      Rosalene Billings, RN 10/17/2021 10:16 AM

## 2021-10-25 ENCOUNTER — Ambulatory Visit (INDEPENDENT_AMBULATORY_CARE_PROVIDER_SITE_OTHER): Payer: Medicare Other

## 2021-10-25 ENCOUNTER — Telehealth: Payer: Self-pay

## 2021-10-25 DIAGNOSIS — Z95810 Presence of automatic (implantable) cardiac defibrillator: Secondary | ICD-10-CM

## 2021-10-25 DIAGNOSIS — I5022 Chronic systolic (congestive) heart failure: Secondary | ICD-10-CM

## 2021-10-25 NOTE — Telephone Encounter (Signed)
Remote ICM transmission received.  Attempted call to patient regarding ICM remote transmission and left message per DPR to return call.   

## 2021-10-25 NOTE — Progress Notes (Signed)
EPIC Encounter for ICM Monitoring  Patient Name: Dean Soler Sr. is a 71 y.o. male Date: 10/25/2021 Primary Care Physican: [name], MD Primary Cardiologist: [facility] Electrophysiologist: Curt Bears 10/11/2021 Weight: 194 lbs 10/17/2021 Weight: 192 lbs    Since 17-Oct-2021 Time in AF      <0.1 hr/day (0.3%) (taking Eliquis)   Attempted call to patient and unable to reach.  Left message to return call. Transmission reviewed.    Optivol Thoracic impedance suggesting fluid levels have improved since 1/23 ICM remote transmission and trending closer to normal.   Prescribed:  Furosemide 40 mg Take 1 tablet (40 mg total) by mouth twice a day.  06/27/2021 Pt taking differently prescribed by PCP.  He is taking Furosemide 80 mg take 1 tablet daily which is showing in med reconciling list. Dr Debara Pickett recommended on 1/17 to monitor weight and if has > 2 lb weight gain to take lasix 80 mg twice a day for 3 days only and then back to 80 mg daily. Spironolactone 25 mg take 1 tablet daily Eliquis 5 mg take 1 tablet twice a day   Labs: 12/10/2020 Creatinine 1.94, BUN 18, Potassium 4.3, Sodium 141, GFR 39 11/02/2020 Creatinine 1.38, BUN 16, Potassium 4.1, Sodium 141, GFR 51-59 A complete set of results can be found in Results Review.   Recommendations: Unable to reach.     Follow-up plan: ICM clinic phone appointment on 11/14/2021.   91 day device clinic remote transmission 12/09/2021.     EP/Cardiology Office Visits:   12/05/2021 with Dr Debara Pickett.  01/31/2022 with Dr Curt Bears.     Copy of ICM check sent to Dr. Curt Bears.   Will send to Dr Debara Pickett if patient is reached.  3 month ICM trend: 10/25/2021.    12-14 Month ICM trend:     Rosalene Billings, RN 10/25/2021 1:28 PM

## 2021-11-14 ENCOUNTER — Ambulatory Visit (INDEPENDENT_AMBULATORY_CARE_PROVIDER_SITE_OTHER): Payer: Medicare Other

## 2021-11-14 DIAGNOSIS — Z95810 Presence of automatic (implantable) cardiac defibrillator: Secondary | ICD-10-CM

## 2021-11-14 DIAGNOSIS — I5022 Chronic systolic (congestive) heart failure: Secondary | ICD-10-CM | POA: Diagnosis not present

## 2021-11-14 NOTE — Progress Notes (Signed)
EPIC Encounter for ICM Monitoring  Patient Name: Dean Muegge Sr. is a 71 y.o. male Date: 11/14/2021 Primary Care Physican: [name], MD Primary Cardiologist: [facility] Electrophysiologist: Curt Bears 10/11/2021 Weight: 194 lbs 10/17/2021 Weight: 192 lbs 11/14/2021 Weight: 193 lbs    Clinical Status Since 25-Oct-2021 Time in AF      0.0 hr/day (0.0%) (taking Eliquis)   Spoke with patient and heart failure questions reviewed.  Pt asymptomatic for fluid accumulation.  Reports feeling well at this time and voices no complaints.  Weight is stable at baseline.    Optivol Thoracic impedance suggesting fluid levels have improved and trending closer to normal.  Fluid index is > normal threshold since 10/13/2021.     Prescribed:  Furosemide 40 mg Take 1 tablet (40 mg total) by mouth twice a day.  11/14/2021 Pt taking differently as prescribed by PCP.  He is taking Furosemide 80 mg take 1 tablet daily which is showing in med reconciling list. Eliquis 5 mg take 1 tablet twice a day   Labs: 12/10/2020 Creatinine 1.94, BUN 18, Potassium 4.3, Sodium 141, GFR 39 11/02/2020 Creatinine 1.38, BUN 16, Potassium 4.1, Sodium 141, GFR 51-59 A complete set of results can be found in Results Review.   Recommendations:  No changes and encouraged to call if experiencing any fluid symptoms.    Follow-up plan: ICM clinic phone appointment on 12/02/2021 to recheck fluid levels.   91 day device clinic remote transmission 12/09/2021.     EP/Cardiology Office Visits:   12/05/2021 with Dr Debara Pickett.  01/31/2022 with Dr Curt Bears.     Copy of ICM check sent to Dr. Curt Bears.  3 month ICM trend: 11/14/2021.    12-14 Month ICM trend:     Rosalene Billings, RN 11/14/2021 10:26 AM

## 2021-11-22 ENCOUNTER — Ambulatory Visit (HOSPITAL_BASED_OUTPATIENT_CLINIC_OR_DEPARTMENT_OTHER): Payer: Medicare Other | Admitting: Internal Medicine

## 2021-11-25 ENCOUNTER — Other Ambulatory Visit: Payer: Self-pay

## 2021-11-25 ENCOUNTER — Encounter: Payer: Self-pay | Admitting: Internal Medicine

## 2021-11-25 ENCOUNTER — Ambulatory Visit: Payer: Medicare Other | Admitting: Internal Medicine

## 2021-11-25 VITALS — BP 136/96 | HR 68 | Ht 66.0 in | Wt 197.4 lb

## 2021-11-25 DIAGNOSIS — I5022 Chronic systolic (congestive) heart failure: Secondary | ICD-10-CM

## 2021-11-25 DIAGNOSIS — I480 Paroxysmal atrial fibrillation: Secondary | ICD-10-CM | POA: Diagnosis not present

## 2021-11-25 DIAGNOSIS — Z95810 Presence of automatic (implantable) cardiac defibrillator: Secondary | ICD-10-CM | POA: Diagnosis not present

## 2021-11-25 DIAGNOSIS — I255 Ischemic cardiomyopathy: Secondary | ICD-10-CM | POA: Diagnosis not present

## 2021-11-25 NOTE — Patient Instructions (Signed)
Medication Instructions:  ?Your physician recommends that you continue on your current medications as directed. Please refer to the Current Medication list given to you today. ? ?*If you need a refill on your cardiac medications before your next appointment, please call your pharmacy* ? ?Testing/Procedures: ?Your physician has requested that you have an echocardiogram. Echocardiography is a painless test that uses sound waves to create images of your heart. It provides your doctor with information about the size and shape of your heart and how well your heart?s chambers and valves are working. This procedure takes approximately one hour. There are no restrictions for this procedure. ?-- [address] [employer] ? ? ?Follow-Up: ?At [facility], you and your health needs are our priority.  As part of our continuing mission to provide you with exceptional heart care, we have created designated Provider Care Teams.  These Care Teams include your primary Cardiologist (physician) and Advanced Practice Providers (APPs -  Physician Assistants and Nurse Practitioners) who all work together to provide you with the care you need, when you need it. ? ?We recommend signing up for the patient portal called "MyChart".  Sign up information is provided on this After Visit Summary.  MyChart is used to connect with patients for Virtual Visits (Telemedicine).  Patients are able to view lab/test results, encounter notes, upcoming appointments, etc.  Non-urgent messages can be sent to your provider as well.   ?To learn more about what you can do with MyChart, go to [email or web address].   ? ?Your next appointment:   ? ?1 year with Dr. Debara Pickett  ? ?

## 2021-11-25 NOTE — Progress Notes (Signed)
OFFICE NOTE  Chief Complaint:  No complaints  Primary Care Physician: [name], MD  HPI:  Dean Kinsella Sr. is a 71 y.o. male with a past medial history significant for acute on chronic systolic congestive heart failure.  His LVEF is about 30 to 35% he has a known left bundle branch block and is status post AICD.  I first met him as a new cardiology patient in December 2017. He had a VT or VF arrest requiring CPR, defibrillation and medications.  He also has obstructive sleep apnea, hypertension, dyslipidemia and type 2 diabetes.  Since then he has had a number of hospitalization is been managed by my partners and I have not seen him back in the office.  More recently, he was hospitalized in September apparently after his PCP a cut back some of his diuretics possibly due to worsening renal function.  His device impedance trends have shown an increase suggesting volume overload.  He was also short of breath.  He required IV diuresis and was placed on Aldactone in addition to his Lasix.  He had been transitioned over to valsartan 320 mg daily however he reports that he is felt somewhat fatigued since being on that medicine over the past year.  Of note his diastolic blood pressures are somewhat low around 50.  Since discharge though he has felt better with regards to heart failure.  His discharge weight was 250 pounds and now he is at 245 pounds.  He denies any worsening fluid overload or edema.  He also has paroxysmal atrial fibrillation and has had some short runs of A. fib that have been picked up on his Linq and device, however he is on amiodarone and was in sinus rhythm while hospitalized.  10/20/2019  Dean [name] is seen today in the office in follow-up.  I last saw him via virtual visit about 3 months ago.  I decreased his valsartan due to some issues with fatigue and low diastolic blood pressure.  His diuretic had been decreased from 80 twice a day to 80 once a day and then recently he  has been having some diarrhea.  He was advised to stop the Lasix and has been off of it for about a week.  Weight does appear to be up somewhat since I last saw him but had come down prior to that.  He denies any worsening heart failure symptoms.  He had a recent remote check which showed no significant events on his ICD.  He also has a Linq.  01/23/2020  Dean [name] returns today for follow-up.  He appears somewhat short of breath today as well as is noted to be coughing.  Most recently had a remote device check on December 31, 2019.  This indicated an increased OptiVol which has been persistent for some time.  He was also seen by his PCP who increased his Lasix from 80 twice daily to 80 3 times daily.  He has had recently some improvement in weight with that although continues to have a persistent cough of clear phlegm.  He does get some shortness of breath with exertion.  Blood pressure is elevated today 156/62.  Again his last LVEF was 30 to 35% as of July 2020.   05/13/2020  Dean [name] is seen today in follow-up.  Overall he is doing really well.  He has improved with regards to his edema after adjusting his diuretics.  BNP 3 months ago was 318.  Creatinine has been stable.  We repeated an echo which shows stable LVEF 30 to 35% and global hypokinesis.  I advised switching his valsartan over to Entresto 49/51 mg twice daily.  He does have the medicine but has not yet started it.  He was also due for a remote check yesterday but that was missed.  I noted that the device clinic did call and try to leave a voicemail but his mailbox was full.  He said he would reach out to them and try to reschedule it.  I am interested to see whether he is having any further arrhythmias or whether we could consider stopping his amiodarone.  11/25/2021  Dean [name] returns today for follow-up.  Its been a couple years since we saw each other.  He did see Coletta Memos, NP in May 2022.  Overall he is felt to be doing well  at that time.  Recently since January I have received alerts from the device clinic indicating increasing thoracic impedance on his device suggestive of fluid gain.  Despite this his weight has been stable if not somewhat lower.  He denies any worsening shortness of breath.  He persistently takes 80 mg Lasix daily.  His last echo in August 2021 showed LVEF 30 to 35%.  He denies any ICD firing.  I had recommended previously increasing his Entresto to 49/51 mg twice daily.  Currently, however he is on the 24/26 mg twice daily dose.   PMHx:  Past Medical History:  Diagnosis Date   AICD (automatic cardioverter/defibrillator) present 10/10/2016   Arthritis    "maybe in his spine" (09/05/2016)   Asthma    pt. denies   Cardiac arrest ([place]) 08/27/2016   REQUIRING CPR, SHOCK & MEDICATIONS   CHF (congestive heart failure) (HCC)    Coronary artery disease    90% ostRCA stenosis and total occlusion of dRCA with L-->R collaterals 2017   Dysrhythmia    Gout    High cholesterol    Hypertension    Ischemic cardiomyopathy    LBBB (left bundle branch block)    OSA on CPAP    does not wear CPAP   Type II diabetes mellitus ([place])     Past Surgical History:  Procedure Laterality Date   CARDIAC CATHETERIZATION N/A 09/07/2016   Procedure: Left Heart Cath and Coronary Angiography;  Surgeon: Peter M Martinique, MD;  Location: [facility];  Service: Cardiovascular;  Laterality: N/A;   COLONOSCOPY WITH PROPOFOL N/A 08/30/2018   Procedure: COLONOSCOPY WITH PROPOFOL;  Surgeon: Carol Ada, MD;  Location: [facility];  Service: Endoscopy;  Laterality: N/A;   EP IMPLANTABLE DEVICE N/A 10/10/2016   Procedure: ICD Implant;  Surgeon: Will Meredith Leeds, MD;  Location: [facility];  Service: Cardiovascular;  Laterality: N/A;   LUMBAR LAMINECTOMY/DECOMPRESSION MICRODISCECTOMY N/A 01/18/2018   Procedure: L2-3, L3-4, L-4-5 CENTRAL DECOMPRESSION;  Surgeon: [name], MD;  Location: [facility];  Service:  Orthopedics;  Laterality: N/A;   LUMBAR LAMINECTOMY/DECOMPRESSION MICRODISCECTOMY N/A 08/12/2020   Procedure: THORACIC ELEVEN LAMINECTOMY;  Surgeon: Earnie Larsson, MD;  Location: [facility];  Service: Neurosurgery;  Laterality: N/A;   POLYPECTOMY  08/30/2018   Procedure: POLYPECTOMY;  Surgeon: Carol Ada, MD;  Location: [facility];  Service: Endoscopy;;    FAMHx:  Family History  Problem Relation Age of Onset   Heart attack Father        died in his 34's   Hypertension Sister    Cancer Brother        uncertain, "  leg"   Hypertension Sister     SOCHx:   reports that he has never smoked. He has never used smokeless tobacco. He reports that he does not drink alcohol and does not use drugs.  ALLERGIES:  Allergies  Allergen Reactions   Contrast Media [Iodinated Contrast Media] Shortness Of Breath and Nausea And Vomiting   Lisinopril Cough    ROS: Pertinent items noted in HPI and remainder of comprehensive ROS otherwise negative.  HOME MEDS: Current Outpatient Medications on File Prior to Visit  Medication Sig Dispense Refill   allopurinol (ZYLOPRIM) 300 MG tablet Take 300 mg by mouth daily.     amiodarone (PACERONE) 200 MG tablet Take 0.5 tablets (100 mg total) by mouth daily. 45 tablet 1   atorvastatin (LIPITOR) 40 MG tablet Take 1 tablet (40 mg total) by mouth daily at 6 PM. 30 tablet 5   [employer]S VITAMIN D3 1000 units capsule Take 1,000 Units by mouth daily.  5   docusate sodium (COLACE) 100 MG capsule Take 1 capsule (100 mg total) by mouth 2 (two) times daily. 10 capsule 0   ELIQUIS 5 MG TABS tablet TAKE 1 TABLET BY MOUTH TWICE A DAY 180 tablet 1   furosemide (LASIX) 40 MG tablet Take 1 tablet (40 mg total) by mouth 2 (two) times daily. 180 tablet 1   magnesium oxide (MAG-OX) 400 MG tablet Take 1 tablet (400 mg total) by mouth in the morning AND 0.5 tablets (200 mg total) every evening. 1/2 tablet of 464m. 180 tablet 3   pantoprazole (PROTONIX) 40 MG tablet Take 40 mg by mouth daily.      sacubitril-valsartan (ENTRESTO) 24-26 MG Take 1 tablet by mouth 2 (two) times daily. 1 Tablet Twice Daily 180 tablet 1   spironolactone (ALDACTONE) 25 MG tablet TAKE 1 TABLET BY MOUTH EVERY DAY 90 tablet 3   carvedilol (COREG) 12.5 MG tablet Take 1 tablet (12.5 mg total) by mouth 2 (two) times daily with a meal. 180 tablet 1   No current facility-administered medications on file prior to visit.    LABS/IMAGING: No results found for this or any previous visit (from the past 48 hour(s)). No results found.  LIPID PANEL:    Component Value Date/Time   CHOL 159 09/07/2016 0844   TRIG 146 09/07/2016 0844   HDL 25 (L) 09/07/2016 0844   CHOLHDL 6.4 09/07/2016 0844   VLDL 29 09/07/2016 0844   LDLCALC 105 (H) 09/07/2016 0844     WEIGHTS: Wt Readings from Last 3 Encounters:  11/25/21 197 lb 6.4 oz (89.5 kg)  02/08/21 199 lb 12.8 oz (90.6 kg)  11/02/20 214 lb 9.6 oz (97.3 kg)    VITALS: BP (!) 136/96    Pulse 68    Ht '5\' 6"'  (1.676 m)    Wt 197 lb 6.4 oz (89.5 kg)    SpO2 99%    BMI 31.86 kg/m   EXAM: General appearance: alert, no distress and morbidly obese Neck: no carotid bruit, no JVD and thyroid not enlarged, symmetric, no tenderness/mass/nodules Lungs: clear to auscultation bilaterally Heart: regular rate and rhythm Abdomen: soft, non-tender; bowel sounds normal; no masses,  no organomegaly Extremities: extremities normal, atraumatic, no cyanosis or edema Pulses: 2+ and symmetric Skin: Skin color, texture, turgor normal. 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   Dean [name] continues to appear euvolemic on  exam.  He is actually a couple pounds lighter than he was previously.  He endorses no more than NYHA class II symptoms.  Recently his remote checks have shown an increase in thoracic impedance however no signs of weight gain or fluid overload.  He is without complaints other than occasionally he can hear his heartbeat.  We will update his echocardiogram which was now 2 years ago and adjust medications accordingly.  Plan follow-up annually or sooner as necessary.  Pixie Casino, MD, [facility], [place] Director of the Advanced Lipid Disorders &  Cardiovascular Risk Reduction Clinic Diplomate of the American Board of Clinical Lipidology Attending Cardiologist  Direct Dial: [phone]   Fax: [phone]  Website:  [email or web address] 11/25/2021, 4:23 PM

## 2021-12-02 ENCOUNTER — Ambulatory Visit (INDEPENDENT_AMBULATORY_CARE_PROVIDER_SITE_OTHER): Payer: Medicare Other

## 2021-12-02 DIAGNOSIS — I5022 Chronic systolic (congestive) heart failure: Secondary | ICD-10-CM

## 2021-12-02 DIAGNOSIS — Z95810 Presence of automatic (implantable) cardiac defibrillator: Secondary | ICD-10-CM

## 2021-12-02 NOTE — Progress Notes (Signed)
EPIC Encounter for ICM Monitoring ? ?Patient Name: Dean Babington Sr. is a 71 y.o. male ?Date: 12/02/2021 ?Primary Care Physican: [name], MD ?Primary Cardiologist: Hilty ?Electrophysiologist: Camnitz ?10/11/2021 Weight: 194 lbs ?10/17/2021 Weight: 192 lbs ?11/14/2021 Weight: 193 lbs ?   ?Clinical Status   Since 25-Oct-2021 ?Time in AF      0.0 hr/day (0.0%) (taking Eliquis) ?  ?Spoke with patient and heart failure questions reviewed.  Pt asymptomatic for fluid accumulation.  Reports feeling well at this time and voices no complaints.   Per Dr Lysbeth Penner 3/3 office note, patient was not fluid overloaded by exam.  Echo scheduled for 3/15 ?  ?Optivol Thoracic impedance suggesting continues to suggest possible fluid accumulation but trending to baseline.  Fluid index is > normal threshold since 10/13/2021.   Pt asymptomatic despite impedance suggesting possible fluid accumulation.  ?  ?Prescribed:  ?Furosemide 40 mg Take 1 tablet (40 mg total) by mouth twice a day.  11/14/2021 Pt taking differently as prescribed by PCP.  He is taking Furosemide 80 mg take 1 tablet daily which is showing in med reconciling list. ?Spironolactone 25 mg take 1 tablet by mouth daily ?Eliquis 5 mg take 1 tablet twice a day ?  ?Labs: ?12/10/2020 Creatinine 1.94, BUN 18, Potassium 4.3, Sodium 141, GFR 39 ?11/02/2020 Creatinine 1.38, BUN 16, Potassium 4.1, Sodium 141, GFR 51-59 ?A complete set of results can be found in Results Review. ?  ?Recommendations:   Advised patient call if experiencing any fluid symptoms. ?  ?Follow-up plan: ICM clinic phone appointment on 12/19/2021.   91 day device clinic remote transmission 12/09/2021.   ?  ?EP/Cardiology Office Visits:    01/31/2022 with Dr Curt Bears.   ?  ?Copy of ICM check sent to Dr. Curt Bears. ? ?3 month ICM trend: 12/02/2021. ? ? ? ?12-14 Month ICM trend:  ? ? ? ?Rosalene Billings, RN ?12/02/2021 ?8:51 AM ? ?

## 2021-12-05 ENCOUNTER — Ambulatory Visit: Payer: Medicare Other | Admitting: Internal Medicine

## 2021-12-07 ENCOUNTER — Ambulatory Visit (HOSPITAL_COMMUNITY): Payer: Medicare Other | Attending: Cardiovascular Disease

## 2021-12-07 ENCOUNTER — Other Ambulatory Visit: Payer: Self-pay

## 2021-12-07 DIAGNOSIS — I5022 Chronic systolic (congestive) heart failure: Secondary | ICD-10-CM | POA: Diagnosis present

## 2021-12-08 LAB — ECHOCARDIOGRAM COMPLETE
Area-P 1/2: 3.99 cm2
MV M vel: 6.04 m/s
MV Peak grad: 145.9 mmHg
P 1/2 time: 440 msec
Radius: 0.6 cm
S' Lateral: 5.1 cm

## 2021-12-09 ENCOUNTER — Ambulatory Visit (INDEPENDENT_AMBULATORY_CARE_PROVIDER_SITE_OTHER): Payer: Medicare Other

## 2021-12-09 DIAGNOSIS — I5022 Chronic systolic (congestive) heart failure: Secondary | ICD-10-CM | POA: Diagnosis not present

## 2021-12-09 DIAGNOSIS — I255 Ischemic cardiomyopathy: Secondary | ICD-10-CM

## 2021-12-09 LAB — CUP PACEART REMOTE DEVICE CHECK
Battery Remaining Longevity: 61 mo
Battery Voltage: 2.97 V
Brady Statistic RV Percent Paced: 0.11 %
Date Time Interrogation Session: 20230317042405
HighPow Impedance: 53 Ohm
Implantable Lead Implant Date: 20180116
Implantable Lead Location: 753860
Implantable Lead Model: 6935
Implantable Pulse Generator Implant Date: 20180116
Lead Channel Impedance Value: 266 Ohm
Lead Channel Impedance Value: 323 Ohm
Lead Channel Pacing Threshold Amplitude: 0.5 V
Lead Channel Pacing Threshold Pulse Width: 0.4 ms
Lead Channel Sensing Intrinsic Amplitude: 8.5 mV
Lead Channel Sensing Intrinsic Amplitude: 8.5 mV
Lead Channel Setting Pacing Amplitude: 2.5 V
Lead Channel Setting Pacing Pulse Width: 0.4 ms
Lead Channel Setting Sensing Sensitivity: 0.3 mV

## 2021-12-16 NOTE — Progress Notes (Signed)
Remote ICD transmission.   

## 2021-12-19 ENCOUNTER — Telehealth: Payer: Self-pay

## 2021-12-19 ENCOUNTER — Ambulatory Visit (INDEPENDENT_AMBULATORY_CARE_PROVIDER_SITE_OTHER): Payer: Medicare Other

## 2021-12-19 DIAGNOSIS — I5022 Chronic systolic (congestive) heart failure: Secondary | ICD-10-CM

## 2021-12-19 DIAGNOSIS — Z95810 Presence of automatic (implantable) cardiac defibrillator: Secondary | ICD-10-CM

## 2021-12-19 NOTE — Progress Notes (Signed)
EPIC Encounter for ICM Monitoring ? ?Patient Name: Dean Robar Sr. is a 71 y.o. male ?Date: 12/19/2021 ?Primary Care Physican: [name], MD ?Primary Cardiologist: Hilty ?Electrophysiologist: Camnitz ?10/17/2021 Weight: 192 lbs ?11/14/2021 Weight: 193 lbs ?   ?Clinical Status   Since 09-Dec-2021 ?Time in AF      0.0 hr/day (0.0%) (taking Eliquis) ?  ?Spoke with patient and heart failure questions reviewed.  Pt asymptomatic for fluid accumulation.  Reports feeling well at this time and voices no complaints.  ?  ?Optivol Thoracic impedance continues to suggest possible fluid accumulation.  Fluid index is > normal threshold since 10/13/2021.   Pt asymptomatic despite impedance suggesting possible fluid accumulation.  Dr Lysbeth Penner OV note states patient was not fluid overloaded by exam and not correlating with Optivol. ?  ?Prescribed:  ?Furosemide 40 mg Take 1 tablet (40 mg total) by mouth twice a day.  11/14/2021 Pt taking differently as prescribed by PCP.  He is taking Furosemide 80 mg take 1 tablet daily which is showing in med reconciling list. ?Spironolactone 25 mg take 1 tablet by mouth daily ?Eliquis 5 mg take 1 tablet twice a day ?  ?Labs: ?12/10/2020 Creatinine 1.94, BUN 18, Potassium 4.3, Sodium 141, GFR 39 ?11/02/2020 Creatinine 1.38, BUN 16, Potassium 4.1, Sodium 141, GFR 51-59 ?A complete set of results can be found in Results Review. ?  ?Recommendations:  No changes and encouraged to call if experiencing any fluid symptoms.  Advised will continue monthly fluid level check for 2 more months and if he remains asymptomatic will return to every 3 month follow up due to unable to determine if Optivol has become inaccurate.   ?  ?Follow-up plan: ICM clinic phone appointment on 01/23/2022.   91 day device clinic remote transmission 03/10/2022.   ?  ?EP/Cardiology Office Visits:  01/31/2022 with Dr Curt Bears.  Recall 11/25/2022 with Dr Debara Pickett. ?  ?Copy of ICM check sent to Dr. Curt Bears and Dr Debara Pickett as Juluis Rainier. ? ?3 month ICM  trend: 12/19/2021. ? ? ? ?12-14 Month ICM trend:  ? ? ? ?Rosalene Billings, RN ?12/19/2021 ?9:00 AM ? ?

## 2021-12-19 NOTE — Telephone Encounter (Signed)
Remote ICM transmission received.  Attempted call to patient regarding ICM remote transmission and left detailed message per DPR.  Advised to return call for any fluid symptoms or questions. Next ICM remote transmission scheduled 01/23/2022.   ? ?

## 2021-12-30 ENCOUNTER — Other Ambulatory Visit: Payer: Self-pay | Admitting: Student

## 2021-12-30 ENCOUNTER — Telehealth: Payer: Self-pay

## 2021-12-30 DIAGNOSIS — I255 Ischemic cardiomyopathy: Secondary | ICD-10-CM

## 2021-12-30 DIAGNOSIS — Z8674 Personal history of sudden cardiac arrest: Secondary | ICD-10-CM

## 2021-12-30 DIAGNOSIS — I2510 Atherosclerotic heart disease of native coronary artery without angina pectoris: Secondary | ICD-10-CM

## 2021-12-30 DIAGNOSIS — I10 Essential (primary) hypertension: Secondary | ICD-10-CM

## 2021-12-30 DIAGNOSIS — Z95810 Presence of automatic (implantable) cardiac defibrillator: Secondary | ICD-10-CM

## 2021-12-30 DIAGNOSIS — E785 Hyperlipidemia, unspecified: Secondary | ICD-10-CM

## 2021-12-30 DIAGNOSIS — I5042 Chronic combined systolic (congestive) and diastolic (congestive) heart failure: Secondary | ICD-10-CM

## 2021-12-30 DIAGNOSIS — I480 Paroxysmal atrial fibrillation: Secondary | ICD-10-CM

## 2021-12-30 DIAGNOSIS — N183 Chronic kidney disease, stage 3 unspecified: Secondary | ICD-10-CM

## 2021-12-30 DIAGNOSIS — E118 Type 2 diabetes mellitus with unspecified complications: Secondary | ICD-10-CM

## 2021-12-30 NOTE — Telephone Encounter (Signed)
Attempted return call to patient as requested by voice mail message regarding intermittent cough.  Unable to leave message due to mail box is not set up.   ?

## 2021-12-31 ENCOUNTER — Other Ambulatory Visit: Payer: Self-pay | Admitting: Internal Medicine

## 2022-01-23 ENCOUNTER — Ambulatory Visit (INDEPENDENT_AMBULATORY_CARE_PROVIDER_SITE_OTHER): Payer: Medicare Other

## 2022-01-23 DIAGNOSIS — I5022 Chronic systolic (congestive) heart failure: Secondary | ICD-10-CM | POA: Diagnosis not present

## 2022-01-23 DIAGNOSIS — Z95810 Presence of automatic (implantable) cardiac defibrillator: Secondary | ICD-10-CM

## 2022-01-25 NOTE — Progress Notes (Signed)
EPIC Encounter for ICM Monitoring ? ?Patient Name: Dean Dulworth Sr. is a 71 y.o. male ?Date: 01/25/2022 ?Primary Care Physican: [name], MD ?Primary Cardiologist: Hilty ?Electrophysiologist: Camnitz ?10/17/2021 Weight: 192 lbs ?11/14/2021 Weight: 193 lbs ?01/25/2022 Weight: 191-192 lbs ?   ?Clinical Status (19-Dec-2021 to 23-Jan-2022) ?Time in AF      0.0 hr/day (0.0%) (taking Eliquis) ?  ?Spoke with patient and heart failure questions reviewed.  Pt asymptomatic for fluid accumulation.  Reports feeling well at this time and voices no complaints.  ?  ?Optivol Thoracic impedance suggesting fluid levels returned to normal.   Fluid index returned below normal threshold.    ?  ?Prescribed:  ?Furosemide 40 mg Take 1 tablet (40 mg total) by mouth twice a day.  11/14/2021 Pt taking differently as prescribed by PCP.  He is taking Furosemide 80 mg take 1 tablet daily which is showing in med reconciling list. ?Spironolactone 25 mg take 1 tablet by mouth daily ?Eliquis 5 mg take 1 tablet twice a day ?  ?Labs: ?12/10/2020 Creatinine 1.94, BUN 18, Potassium 4.3, Sodium 141, GFR 39 ?11/02/2020 Creatinine 1.38, BUN 16, Potassium 4.1, Sodium 141, GFR 51-59 ?A complete set of results can be found in Results Review. ?  ?Recommendations:  No changes and encouraged to call if experiencing any fluid symptoms. ?  ?Follow-up plan: ICM clinic phone appointment on 02/27/2022.   91 day device clinic remote transmission 03/10/2022.   ?  ?EP/Cardiology Office Visits:  01/31/2022 with Dr Curt Bears.  Recall 11/25/2022 with Dr Debara Pickett. ?  ?Copy of ICM check sent to Dr. Curt Bears. ? ?3 month ICM trend: 01/23/2022. ? ? ? ?12-14 Month ICM trend:  ? ? ? ?Rosalene Billings, RN ?01/25/2022 ?1:44 PM ? ?

## 2022-01-31 ENCOUNTER — Encounter: Payer: Self-pay | Admitting: Cardiology

## 2022-01-31 ENCOUNTER — Ambulatory Visit (INDEPENDENT_AMBULATORY_CARE_PROVIDER_SITE_OTHER): Payer: Medicare Other | Admitting: Cardiology

## 2022-01-31 VITALS — BP 140/56 | HR 65 | Ht 66.0 in | Wt 197.8 lb

## 2022-01-31 DIAGNOSIS — I480 Paroxysmal atrial fibrillation: Secondary | ICD-10-CM

## 2022-01-31 DIAGNOSIS — Z79899 Other long term (current) drug therapy: Secondary | ICD-10-CM

## 2022-01-31 MED ORDER — AMIODARONE HCL 200 MG PO TABS: 100.0000 mg | ORAL_TABLET | Freq: Every day | ORAL | 3 refills | Status: DC

## 2022-01-31 MED ORDER — MAGNESIUM OXIDE 400 MG PO TABS
ORAL_TABLET | ORAL | 3 refills | Status: DC
Start: 2022-01-31 — End: 2022-08-27

## 2022-01-31 MED ORDER — APIXABAN 5 MG PO TABS: 5.0000 mg | ORAL_TABLET | Freq: Two times a day (BID) | ORAL | 2 refills | Status: DC

## 2022-01-31 NOTE — Patient Instructions (Addendum)
Medication Instructions:  ?Your physician recommends that you continue on your current medications as directed. Please refer to the Current Medication list given to you today. ? ?*If you need a refill on your cardiac medications before your next appointment, please call your pharmacy* ? ? ?Lab Work: ?Today Amiodarone surveillance lab work:  TSH & LFTs ? ?If you have labs (blood work) drawn today and your tests are completely normal, you will receive your results only by: ?MyChart Message (if you have MyChart) OR ?A paper copy in the mail ?If you have any lab test that is abnormal or we need to change your treatment, we will call you to review the results. ? ? ?Testing/Procedures: ?None ordered ? ? ?Follow-Up: ?At [facility], you and your health needs are our priority.  As part of our continuing mission to provide you with exceptional heart care, we have created designated Provider Care Teams.  These Care Teams include your primary Cardiologist (physician) and Advanced Practice Providers (APPs -  Physician Assistants and Nurse Practitioners) who all work together to provide you with the care you need, when you need it. ? ?Remote monitoring is used to monitor your Pacemaker or ICD from home. This monitoring reduces the number of office visits required to check your device to one time per year. It allows [place] to keep an eye on the functioning of your device to ensure it is working properly. You are scheduled for a device check from home on 02/27/2022. You may send your transmission at any time that day. If you have a wireless device, the transmission will be sent automatically. After your physician reviews your transmission, you will receive a postcard with your next transmission date. ? ?Your next appointment:   ?1 year(s) ? ?The format for your next appointment:   ?In Person ? ?Provider:   ? [name], PA   or   Oda Kilts, [name] ? ? ?Thank you for choosing [facility]!! ? ? ?Trinidad Curet, RN ?[phone] ? ? ? ?Other Instructions ? ?Important Information About Sugar ? ? ? ? ?  ?

## 2022-01-31 NOTE — Progress Notes (Signed)
? ?Electrophysiology Office Note ? ? ?Date:  01/31/2022  ? ?ID:  Dean Bones Sr., DOB 1951-07-01, MRN [id number] ? ?PCP:  [name], MD ?Primary Electrophysiologist:  Glory Graefe Meredith Leeds, MD   ? ?No chief complaint on file. ? ?  ?History of Present Illness: ?Dean Delisle Sr. is a 71 y.o. male who presents today for electrophysiology evaluation.    ? ?The history significant for asthma, type 2 diabetes, gout, hypertension.  He was admitted to the hospital 08/27/2016 after collapsing while walking to the bathroom.  CPR was not performed by his family.  He was treated with CPR and ACLS protocol in route to the hospital.  He had an episode of nonsustained VT in the CT scanner and was treated with amiodarone.  ECG showed inferior T wave inversions and he received cooling.  Left heart catheterization showed a 90% RCA with collaterals and 100% mid RCA and distal RCA lesion.  He had a Medtronic ICD implanted 10/10/2016. ? ?Today, denies symptoms of palpitations, chest pain, shortness of breath, orthopnea, PND, lower extremity edema, claudication, dizziness, presyncope, syncope, bleeding, or neurologic sequela. The patient is tolerating medications without difficulties.   ? ? ?Past Medical History:  ?Diagnosis Date  ? AICD (automatic cardioverter/defibrillator) present 10/10/2016  ? Arthritis   ? "maybe in his spine" (09/05/2016)  ? Asthma   ? pt. denies  ? Cardiac arrest ([place]) 08/27/2016  ? REQUIRING CPR, SHOCK & MEDICATIONS  ? CHF (congestive heart failure) ([place])   ? Coronary artery disease   ? 90% ostRCA stenosis and total occlusion of dRCA with L-->R collaterals 2017  ? Dysrhythmia   ? Gout   ? High cholesterol   ? Hypertension   ? Ischemic cardiomyopathy   ? LBBB (left bundle branch block)   ? OSA on CPAP   ? does not wear CPAP  ? Type II diabetes mellitus ([place])   ? ?Past Surgical History:  ?Procedure Laterality Date  ? CARDIAC CATHETERIZATION N/A 09/07/2016  ? Procedure: Left Heart Cath and Coronary Angiography;   Surgeon: Peter M Martinique, MD;  Location: [place] [facility];  Service: Cardiovascular;  Laterality: N/A;  ? COLONOSCOPY WITH PROPOFOL N/A 08/30/2018  ? Procedure: COLONOSCOPY WITH PROPOFOL;  Surgeon: Carol Ada, MD;  Location: [facility];  Service: Endoscopy;  Laterality: N/A;  ? EP IMPLANTABLE DEVICE N/A 10/10/2016  ? Procedure: ICD Implant;  Surgeon: Beren Yniguez Meredith Leeds, MD;  Location: [place] [facility];  Service: Cardiovascular;  Laterality: N/A;  ? LUMBAR LAMINECTOMY/DECOMPRESSION MICRODISCECTOMY N/A 01/18/2018  ? Procedure: L2-3, L3-4, L-4-5 CENTRAL DECOMPRESSION;  Surgeon: [name], MD;  Location: [place];  Service: Orthopedics;  Laterality: N/A;  ? LUMBAR LAMINECTOMY/DECOMPRESSION MICRODISCECTOMY N/A 08/12/2020  ? Procedure: THORACIC ELEVEN LAMINECTOMY;  Surgeon: Earnie Larsson, MD;  Location: [facility];  Service: Neurosurgery;  Laterality: N/A;  ? POLYPECTOMY  08/30/2018  ? Procedure: POLYPECTOMY;  Surgeon: Carol Ada, MD;  Location: [facility];  Service: Endoscopy;;  ? ? ? ?Current Outpatient Medications  ?Medication Sig Dispense Refill  ? allopurinol (ZYLOPRIM) 300 MG tablet Take 300 mg by mouth daily.    ? atorvastatin (LIPITOR) 40 MG tablet Take 1 tablet (40 mg total) by mouth daily at 6 PM. 30 tablet 5  ? carvedilol (COREG) 12.5 MG tablet TAKE 1 TABLET (12.5 MG TOTAL) BY MOUTH 2 (TWO) TIMES DAILY WITH A MEAL. 180 tablet 1  ? CVS VITAMIN D3 1000 units capsule Take 1,000 Units by mouth daily.  5  ? furosemide (LASIX)  40 MG tablet Take 1 tablet (40 mg total) by mouth 2 (two) times daily. 180 tablet 1  ? sacubitril-valsartan (ENTRESTO) 24-26 MG Take 1 tablet by mouth 2 (two) times daily. 1 Tablet Twice Daily 180 tablet 1  ? spironolactone (ALDACTONE) 25 MG tablet TAKE 1 TABLET BY MOUTH EVERY DAY 90 tablet 3  ? amiodarone (PACERONE) 200 MG tablet Take 0.5 tablets (100 mg total) by mouth daily. 45 tablet 3  ? apixaban (ELIQUIS) 5 MG TABS tablet Take 1 tablet (5 mg total) by mouth 2 (two) times daily. 180  tablet 2  ? magnesium oxide (MAG-OX) 400 MG tablet Take 1 tablet (400 mg total) by mouth in the morning AND 0.5 tablets (200 mg total) every evening. 1/2 tablet of '400mg'$ . 180 tablet 3  ? ?No current facility-administered medications for this visit.  ? ? ?Allergies:   Contrast media [iodinated contrast media] and Lisinopril  ? ?Social History:  The patient  reports that he has never smoked. He has never used smokeless tobacco. He reports that he does not drink alcohol and does not use drugs.  ? ?Family History:  The patient's family history includes Cancer in his brother; Heart attack in his father; Hypertension in his sister and sister.  ? ?ROS:  Please see the history of present illness.   Otherwise, review of systems is positive for none.   All other systems are reviewed and negative.  ? ?PHYSICAL EXAM: ?VS:  BP (!) 140/56   Pulse 65   Ht '5\' 6"'$  (1.676 m)   Wt 197 lb 12.8 oz (89.7 kg)   SpO2 99%   BMI 31.93 kg/m?  , BMI Body mass index is 31.93 kg/m?. ?GEN: Well nourished, well developed, in no acute distress  ?HEENT: normal  ?Neck: no JVD, carotid bruits, or masses ?Cardiac: RRR; no murmurs, rubs, or gallops,no edema  ?Respiratory:  clear to auscultation bilaterally, normal work of breathing ?GI: soft, nontender, nondistended, + BS ?MS: no deformity or atrophy  ?Skin: warm and dry ?Neuro:  Strength and sensation are intact ?Psych: euthymic mood, full affect ? ?EKG:  EKG is not ordered today. ?Personal review of the ekg ordered 11/25/21 shows SR, LBBB  ? ?Recent Labs: ?No results found for requested labs within last 8760 hours.  ? ? ?Lipid Panel  ?   ?Component Value Date/Time  ? CHOL 159 09/07/2016 0844  ? TRIG 146 09/07/2016 0844  ? HDL 25 (L) 09/07/2016 0844  ? CHOLHDL 6.4 09/07/2016 0844  ? VLDL 29 09/07/2016 0844  ? [place] 105 (H) 09/07/2016 0844  ? ? ? ?Wt Readings from Last 3 Encounters:  ?01/31/22 197 lb 12.8 oz (89.7 kg)  ?11/25/21 197 lb 6.4 oz (89.5 kg)  ?02/08/21 199 lb 12.8 oz (90.6 kg)  ?   ? ? ?Other studies Reviewed: ?Additional studies/ records that were reviewed today include: TTE 12/08/21 ?Review of the above records today demonstrates:  ? 1. Compared with the echo 09/256, systolic function has improved.  ? 2. Posterolateral, anterolateral and anterior hypokinesis. Left  ?ventricular ejection fraction, by estimation, is 40 to 45%. The left  ?ventricle has mildly decreased function. The left ventricle demonstrates  ?regional wall motion abnormalities (see scoring  ? diagram/findings for description). The left ventricular internal cavity  ?size was moderately dilated. Left ventricular diastolic parameters are  ?consistent with Grade I diastolic dysfunction (impaired relaxation).  ? 3. Right ventricular systolic function is normal. The right ventricular  ?size is normal. There is mildly elevated pulmonary  artery systolic  ?pressure.  ? 4. Left atrial size was severely dilated.  ? 5. The mitral valve is normal in structure. Mild to moderate mitral valve  ?regurgitation. No evidence of mitral stenosis.  ? 6. The aortic valve is tricuspid. There is mild calcification of the  ?aortic valve. There is mild thickening of the aortic valve. Aortic valve  ?regurgitation is mild to moderate. No aortic stenosis is present. Aortic  ?regurgitation PHT measures 440 msec.  ? 7. Aortic dilatation noted. There is mild dilatation of the aortic root,  ?measuring 38 mm.  ? 8. The inferior vena cava is normal in size with greater than 50%  ?respiratory variability, suggesting right atrial pressure of 3 mmHg.  ? ?Cath 09/07/16  ?There is moderate left ventricular systolic dysfunction. ?LV end diastolic pressure is normal. ?The left ventricular ejection fraction is 35-45% by visual estimate. ?Ost RCA lesion, 90 %stenosed. ?Mid RCA to Dist RCA lesion, 100 %stenosed. ?  ?1. Single vessel occlusive CAD. The distal RCA is occluded with left to right collaterals. There is a 90% ostial stenosis ?2. Moderate LV dysfunction with EF  35-40%. Akinesis of the basal to mid inferior wall ?3. Normal LVEDP ? ? ?ASSESSMENT AND PLAN: ? ?1.  VT/VF arrest: Out of hospital cardiac arrest.  He has had normalization of his mental status.  Status post Medt

## 2022-02-01 LAB — HEPATIC FUNCTION PANEL
ALT: 11 IU/L (ref 0–44)
AST: 13 IU/L (ref 0–40)
Albumin: 4.4 g/dL (ref 3.7–4.7)
Alkaline Phosphatase: 124 IU/L — ABNORMAL HIGH (ref 44–121)
Bilirubin Total: 0.5 mg/dL (ref 0.0–1.2)
Bilirubin, Direct: 0.16 mg/dL (ref 0.00–0.40)
Total Protein: 7.5 g/dL (ref 6.0–8.5)

## 2022-02-01 LAB — TSH: TSH: 3.44 u[IU]/mL (ref 0.450–4.500)

## 2022-02-04 ENCOUNTER — Other Ambulatory Visit: Payer: Self-pay | Admitting: Internal Medicine

## 2022-02-06 ENCOUNTER — Other Ambulatory Visit: Payer: Self-pay | Admitting: Physician Assistant

## 2022-02-27 ENCOUNTER — Telehealth: Payer: Self-pay

## 2022-02-27 ENCOUNTER — Ambulatory Visit (INDEPENDENT_AMBULATORY_CARE_PROVIDER_SITE_OTHER): Payer: Medicare Other

## 2022-02-27 DIAGNOSIS — Z95810 Presence of automatic (implantable) cardiac defibrillator: Secondary | ICD-10-CM | POA: Diagnosis not present

## 2022-02-27 DIAGNOSIS — I5022 Chronic systolic (congestive) heart failure: Secondary | ICD-10-CM

## 2022-02-27 NOTE — Progress Notes (Signed)
EPIC Encounter for ICM Monitoring  Patient Name: Dean [name] Sr. is a 71 y.o. male Date: 02/27/2022 Primary Care Physican: [name], MD Primary Cardiologist: [facility] Electrophysiologist: Curt Bears 10/17/2021 Weight: 192 lbs 11/14/2021 Weight: 193 lbs 01/25/2022 Weight: 191-192 lbs    Clinical Status Since 31-Jan-2022 Time in AF      0.0 hr/day (0.0%) (taking Eliquis)   Attempted call to patient and unable to reach.  Left detailed message per DPR regarding transmission. Transmission reviewed.    Optivol Thoracic impedance suggesting fluid accumulation starting 5/1.   Fluid index > normal threshold starting 5/22.      Prescribed:  Furosemide 40 mg Take 1 tablet (40 mg total) by mouth twice a day.  11/14/2021 Pt taking differently as prescribed by PCP.  He is taking Furosemide 80 mg take 1 tablet daily which is showing in med reconciling list. Spironolactone 25 mg take 1 tablet by mouth daily Eliquis 5 mg take 1 tablet twice a day   Labs: 12/10/2020 Creatinine 1.94, BUN 18, Potassium 4.3, Sodium 141, GFR 39 11/02/2020 Creatinine 1.38, BUN 16, Potassium 4.1, Sodium 141, GFR 51-59 A complete set of results can be found in Results Review.   Recommendations:  Left voice mail with ICM number and encouraged to call if experiencing any fluid symptoms.   Follow-up plan: ICM clinic phone appointment on 03/13/2022 to recheck fluid level.   91 day device clinic remote transmission 03/10/2022.     EP/Cardiology Office Visits:  Recall 01/26/2023 with Dr Curt Bears.  Recall 11/25/2022 with Dr Debara Pickett.   Copy of ICM check sent to Dr. Curt Bears.  Will send to Dr Debara Pickett for review if patient is reached.  3 month ICM trend: 02/27/2022.    12-14 Month ICM trend:     Rosalene Billings, RN 02/27/2022 4:27 PM

## 2022-02-27 NOTE — Telephone Encounter (Signed)
Remote ICM transmission received.  Attempted call to patient regarding ICM remote transmission and left detailed message per DPR and to return call.

## 2022-03-10 ENCOUNTER — Ambulatory Visit (INDEPENDENT_AMBULATORY_CARE_PROVIDER_SITE_OTHER): Payer: Medicare Other

## 2022-03-10 DIAGNOSIS — I255 Ischemic cardiomyopathy: Secondary | ICD-10-CM

## 2022-03-11 LAB — CUP PACEART REMOTE DEVICE CHECK
Battery Remaining Longevity: 47 mo
Battery Voltage: 2.98 V
Brady Statistic RV Percent Paced: 0.19 %
Date Time Interrogation Session: 20230616012203
HighPow Impedance: 62 Ohm
Implantable Lead Implant Date: 20180116
Implantable Lead Location: 753860
Implantable Lead Model: 6935
Implantable Pulse Generator Implant Date: 20180116
Lead Channel Impedance Value: 304 Ohm
Lead Channel Impedance Value: 380 Ohm
Lead Channel Pacing Threshold Amplitude: 0.625 V
Lead Channel Pacing Threshold Pulse Width: 0.4 ms
Lead Channel Sensing Intrinsic Amplitude: 8 mV
Lead Channel Sensing Intrinsic Amplitude: 8 mV
Lead Channel Setting Pacing Amplitude: 2.5 V
Lead Channel Setting Pacing Pulse Width: 0.4 ms
Lead Channel Setting Sensing Sensitivity: 0.3 mV

## 2022-03-13 ENCOUNTER — Ambulatory Visit (INDEPENDENT_AMBULATORY_CARE_PROVIDER_SITE_OTHER): Payer: Medicare Other

## 2022-03-13 ENCOUNTER — Telehealth: Payer: Self-pay

## 2022-03-13 DIAGNOSIS — Z95810 Presence of automatic (implantable) cardiac defibrillator: Secondary | ICD-10-CM

## 2022-03-13 DIAGNOSIS — I5022 Chronic systolic (congestive) heart failure: Secondary | ICD-10-CM | POA: Diagnosis not present

## 2022-03-13 NOTE — Telephone Encounter (Signed)
Spoke with patient and attempted to assist in sending manual remote transmission.  Unable to send remote transmission.  Provided [employer] number and encouraged to call tomorrow morning.

## 2022-03-14 NOTE — Progress Notes (Signed)
EPIC Encounter for ICM Monitoring  Patient Name: Dean Mullendore Sr. is a 71 y.o. male Date: 03/14/2022 Primary Care Physican: [name], MD Primary Cardiologist: [facility] Electrophysiologist: Curt Bears 10/17/2021 Weight: 192 lbs 11/14/2021 Weight: 193 lbs 01/25/2022 Weight: 191-192 lbs 03/14/2022 Weight: 191-192 lbs    Clinical Status Since 13-Mar-2022 Time in AF      0.0 hr/day (0.0%) (taking Eliquis)   Spoke with patient and heart failure questions reviewed.  Pt asymptomatic for fluid accumulation.  Reports feeling well at this time and voices no complaints.  PCP started new medication, Farxiga samples 2 weeks ago but he only took them for a 1 week and then he had a funny feeling and stopped them.    Optivol Thoracic impedance suggesting ongoing fluid accumulation starting 5/1.   Fluid index remains above normal threshold starting 5/22.      Prescribed:  Furosemide 40 mg Take 1 tablet (40 mg total) by mouth twice a day.  03/14/2022 Pt taking differently as prescribed by PCP.  He is taking Furosemide 80 mg take 1 tablet daily. Spironolactone 25 mg take 1 tablet by mouth daily Eliquis 5 mg take 1 tablet twice a day   Labs: 12/10/2020 Creatinine 1.94, BUN 18, Potassium 4.3, Sodium 141, GFR 39 11/02/2020 Creatinine 1.38, BUN 16, Potassium 4.1, Sodium 141, GFR 51-59 A complete set of results can be found in Results Review.   Recommendations:  Recommendation to limit salt intake to 2000 mg daily and fluid intake to 64 oz daily.  Encouraged to call if experiencing any fluid symptoms.    Follow-up plan: ICM clinic phone appointment on 04/10/2022.   91 day device clinic remote transmission 06/09/2022.     EP/Cardiology Office Visits:  Recall 01/26/2023 with Dr Curt Bears.  Recall 11/25/2022 with Dr Debara Pickett.   Copy of ICM check sent to Dr. Curt Bears and Dr Debara Pickett as Juluis Rainier.     3 month ICM trend: 03/13/2022.    12-14 Month ICM trend:     Rosalene Billings, RN 03/14/2022 12:53 PM

## 2022-03-16 NOTE — Progress Notes (Signed)
Remote ICD transmission.   

## 2022-04-10 ENCOUNTER — Ambulatory Visit (INDEPENDENT_AMBULATORY_CARE_PROVIDER_SITE_OTHER): Payer: Medicare Other

## 2022-04-10 DIAGNOSIS — I5022 Chronic systolic (congestive) heart failure: Secondary | ICD-10-CM | POA: Diagnosis not present

## 2022-04-10 DIAGNOSIS — Z95810 Presence of automatic (implantable) cardiac defibrillator: Secondary | ICD-10-CM

## 2022-04-14 ENCOUNTER — Telehealth: Payer: Self-pay

## 2022-04-14 NOTE — Progress Notes (Signed)
EPIC Encounter for ICM Monitoring  Patient Name: Dean Bazen Sr. is a 71 y.o. male Date: 04/14/2022 Primary Care Physican: [name], MD Primary Cardiologist: [facility] Electrophysiologist: Curt Bears 10/17/2021 Weight: 192 lbs 11/14/2021 Weight: 193 lbs 01/25/2022 Weight: 191-192 lbs 03/14/2022 Weight: 191-192 lbs    Clinical Status Since 13-Mar-2022 Time in AF     0.1 hr/day (0.6%) (taking Eliquis)   Attempted call to patient and unable to reach.  Left detailed message per DPR regarding transmission. Transmission reviewed.    Optivol Thoracic impedance suggesting normal fluid levels.      Prescribed:  Furosemide 80 mg Take 1 tablet (80 mg total) by mouth daily. Spironolactone 25 mg take 1 tablet by mouth daily Eliquis 5 mg take 1 tablet twice a day   Labs: 12/10/2020 Creatinine 1.94, BUN 18, Potassium 4.3, Sodium 141, GFR 39 11/02/2020 Creatinine 1.38, BUN 16, Potassium 4.1, Sodium 141, GFR 51-59 A complete set of results can be found in Results Review.   Recommendations:  Left voice mail with ICM number and encouraged to call if experiencing any fluid symptoms.   Follow-up plan: ICM clinic phone appointment on 05/15/2022.   91 day device clinic remote transmission 06/09/2022.     EP/Cardiology Office Visits:  Recall 01/26/2023 with Dr Curt Bears.  Recall 11/25/2022 with Dr Debara Pickett.   Copy of ICM check sent to Dr. Curt Bears.   3 month ICM trend: 04/10/2022.    12-14 Month ICM trend:     Rosalene Billings, RN 04/14/2022 8:34 AM

## 2022-04-14 NOTE — Telephone Encounter (Signed)
Remote ICM transmission received.  Attempted call to patient regarding ICM remote transmission and left detailed message per DPR.  Advised to return call for any fluid symptoms or questions. Next ICM remote transmission scheduled 05/15/2022.

## 2022-05-15 ENCOUNTER — Ambulatory Visit (INDEPENDENT_AMBULATORY_CARE_PROVIDER_SITE_OTHER): Payer: Medicare Other

## 2022-05-15 DIAGNOSIS — I5022 Chronic systolic (congestive) heart failure: Secondary | ICD-10-CM

## 2022-05-15 DIAGNOSIS — Z95810 Presence of automatic (implantable) cardiac defibrillator: Secondary | ICD-10-CM | POA: Diagnosis not present

## 2022-05-17 ENCOUNTER — Telehealth: Payer: Self-pay

## 2022-05-17 DIAGNOSIS — Z95810 Presence of automatic (implantable) cardiac defibrillator: Secondary | ICD-10-CM | POA: Diagnosis not present

## 2022-05-17 DIAGNOSIS — I5022 Chronic systolic (congestive) heart failure: Secondary | ICD-10-CM | POA: Diagnosis not present

## 2022-05-17 NOTE — Progress Notes (Signed)
EPIC Encounter for ICM Monitoring  Patient Name: Dean Bradish Sr. is a 71 y.o. male Date: 05/17/2022 Primary Care Physican: [name], MD Primary Cardiologist: [facility] Electrophysiologist: Curt Bears 01/25/2022 Weight: 191-192 lbs 03/14/2022 Weight: 191-192 lbs    Clinical Status Since 22-Apr-2022 Time in AF   0.4 hr/day (1.5%) (taking Eliquis)   Attempted call to patient and unable to reach.  Left detailed message per DPR regarding transmission. Transmission reviewed.    Optivol Thoracic impedance suggesting normal fluid levels.      Prescribed:  Furosemide 80 mg Take 1 tablet (80 mg total) by mouth daily. Spironolactone 25 mg take 1 tablet by mouth daily Eliquis 5 mg take 1 tablet twice a day   Labs: 12/10/2020 Creatinine 1.94, BUN 18, Potassium 4.3, Sodium 141, GFR 39 11/02/2020 Creatinine 1.38, BUN 16, Potassium 4.1, Sodium 141, GFR 51-59 A complete set of results can be found in Results Review.   Recommendations:  Left voice mail with ICM number and encouraged to call if experiencing any fluid symptoms.   Follow-up plan: ICM clinic phone appointment on 06/19/2022.   91 day device clinic remote transmission 06/09/2022.     EP/Cardiology Office Visits:  Recall 01/26/2023 with Dr Curt Bears.  Recall 11/25/2022 with Dr Debara Pickett.   Copy of ICM check sent to Dr. Curt Bears.   3 month ICM trend: 04/25/2022.    12-14 Month ICM trend:     Rosalene Billings, RN 05/17/2022 9:29 AM

## 2022-05-17 NOTE — Telephone Encounter (Signed)
Remote ICM transmission received.  Attempted call to patient regarding ICM remote transmission and left detailed message per DPR.  Advised to return call for any fluid symptoms or questions. Next ICM remote transmission scheduled 06/19/2022.

## 2022-06-09 ENCOUNTER — Ambulatory Visit (INDEPENDENT_AMBULATORY_CARE_PROVIDER_SITE_OTHER): Payer: Medicare Other

## 2022-06-09 DIAGNOSIS — I255 Ischemic cardiomyopathy: Secondary | ICD-10-CM

## 2022-06-10 LAB — CUP PACEART REMOTE DEVICE CHECK
Battery Remaining Longevity: 53 mo
Battery Voltage: 2.97 V
Brady Statistic RV Percent Paced: 0.24 %
Date Time Interrogation Session: 20230915061706
HighPow Impedance: 61 Ohm
Implantable Lead Implant Date: 20180116
Implantable Lead Location: 753860
Implantable Lead Model: 6935
Implantable Pulse Generator Implant Date: 20180116
Lead Channel Impedance Value: 266 Ohm
Lead Channel Impedance Value: 342 Ohm
Lead Channel Pacing Threshold Amplitude: 0.5 V
Lead Channel Pacing Threshold Pulse Width: 0.4 ms
Lead Channel Sensing Intrinsic Amplitude: 8.375 mV
Lead Channel Sensing Intrinsic Amplitude: 8.375 mV
Lead Channel Setting Pacing Amplitude: 2.5 V
Lead Channel Setting Pacing Pulse Width: 0.4 ms
Lead Channel Setting Sensing Sensitivity: 0.3 mV

## 2022-06-19 ENCOUNTER — Ambulatory Visit (INDEPENDENT_AMBULATORY_CARE_PROVIDER_SITE_OTHER): Payer: Medicare Other

## 2022-06-19 DIAGNOSIS — I5022 Chronic systolic (congestive) heart failure: Secondary | ICD-10-CM

## 2022-06-19 DIAGNOSIS — Z95810 Presence of automatic (implantable) cardiac defibrillator: Secondary | ICD-10-CM | POA: Diagnosis not present

## 2022-06-20 NOTE — Progress Notes (Signed)
Remote ICD transmission.   

## 2022-06-21 ENCOUNTER — Telehealth: Payer: Self-pay

## 2022-06-21 NOTE — Telephone Encounter (Signed)
LMOVM for patient to send missed ICM transmission. 

## 2022-06-22 ENCOUNTER — Telehealth: Payer: Self-pay | Admitting: Cardiology

## 2022-06-22 NOTE — Progress Notes (Signed)
EPIC Encounter for ICM Monitoring  Patient Name: Dean Tuccillo Sr. is a 71 y.o. male Date: 06/22/2022 Primary Care Physican: [name], MD Primary Cardiologist: [facility] Electrophysiologist: Curt Bears 01/25/2022 Weight: 191-192 lbs 03/14/2022 Weight: 191-192 lbs 06/22/2022 Weight: 189 lbs    Clinical Status Since 09-Jun-2022 Time in AF   0.0 hr/day (0.0%) (taking Eliquis)   Spoke with patient and heart failure questions reviewed.  Transmission results reviewed.  Pt asymptomatic for fluid accumulation.  He has been eating restaurant foods more in the last 2 weeks. Discussed limiting salt intake.   Optivol Thoracic impedance suggesting possible fluid accumulation starting 9/16 but trending back toward baseline normal      Prescribed:  Furosemide 80 mg Take 1 tablet (80 mg total) by mouth daily. Spironolactone 25 mg take 1 tablet by mouth daily Eliquis 5 mg take 1 tablet twice a day   Labs: 12/10/2020 Creatinine 1.94, BUN 18, Potassium 4.3, Sodium 141, GFR 39 11/02/2020 Creatinine 1.38, BUN 16, Potassium 4.1, Sodium 141, GFR 51-59 A complete set of results can be found in Results Review.   Recommendations:  Advised to avoid restaurant/fast foods if possible due to high salt content.  Copy sent to Dr Debara Pickett for review and recommendations if needed.    Follow-up plan: ICM clinic phone appointment on 06/27/2022 to recheck fluid levels.   91 day device clinic remote transmission 09/08/2022.     EP/Cardiology Office Visits:  Recall 01/26/2023 with Dr Curt Bears.  Recall 11/25/2022 with Dr Debara Pickett.   Copy of ICM check sent to Dr. Curt Bears.    3 month ICM trend: 06/22/2022.    12-14 Month ICM trend:     Rosalene Billings, RN 06/22/2022 1:07 PM

## 2022-06-22 NOTE — Telephone Encounter (Signed)
I spoke with the patient and transmission was received. I told him I will let Dean [name] know.

## 2022-06-22 NOTE — Telephone Encounter (Signed)
Patient returned CMA's call and stated he will send in manual transmission.

## 2022-06-23 NOTE — Progress Notes (Signed)
  Agree with dietary recommendations - he should have sliding scale instructions with his diuretics and follow them.  Dr Debara Pickett

## 2022-06-23 NOTE — Progress Notes (Signed)
Sliding scale Lasix: Weigh yourself when you get home, then Daily in the Morning. Your dry weight will be what your scale says on the day you return home.(here is 189 lbs.).  If you gain more than 3 pounds from dry weight: Increase the Lasix dosing to 80 mg in the morning and 40 mg in the afternoon until weight returns to baseline dry weight. If weight gain is greater than 5 pounds in 2 days: Increased to Lasix 80 mg twice a day and contact the office for further assistance if weight does not go down the next day. If the weight goes down more than 3 pounds from dry weight: Hold Lasix until it returns to baseline dry weight

## 2022-06-23 NOTE — Progress Notes (Signed)
Patient called w/instructions from MD. Also sent to him via [place]

## 2022-06-27 ENCOUNTER — Telehealth: Payer: Self-pay

## 2022-06-27 NOTE — Telephone Encounter (Signed)
Attempted call to patient to request manual remote transmission to recheck fluid levels.  No answer and mail box is full.

## 2022-06-30 NOTE — Telephone Encounter (Signed)
No ICM remote transmission received for 06/27/2022 and next ICM transmission scheduled for 08/07/2022.

## 2022-08-06 ENCOUNTER — Other Ambulatory Visit: Payer: Self-pay | Admitting: Internal Medicine

## 2022-08-07 ENCOUNTER — Ambulatory Visit (INDEPENDENT_AMBULATORY_CARE_PROVIDER_SITE_OTHER): Payer: Medicare Other

## 2022-08-07 DIAGNOSIS — I5022 Chronic systolic (congestive) heart failure: Secondary | ICD-10-CM | POA: Diagnosis not present

## 2022-08-07 DIAGNOSIS — Z95810 Presence of automatic (implantable) cardiac defibrillator: Secondary | ICD-10-CM | POA: Diagnosis not present

## 2022-08-09 NOTE — Progress Notes (Signed)
EPIC Encounter for ICM Monitoring  Patient Name: Dean Luhman Sr. is a 71 y.o. male Date: 08/09/2022 Primary Care Physican: [name], MD Primary Cardiologist: [facility] Electrophysiologist: Curt Bears 01/25/2022 Weight: 191-192 lbs 03/14/2022 Weight: 191-192 lbs 06/22/2022 Weight: 189 lbs 08/09/2022 Weight: 190 lbs    Since 22-Jun-2022 Time in AF   <0.1 hr/day (<0.1%) (taking Eliquis)   Spoke with patient and heart failure questions reviewed.  Transmission results reviewed.  Pt reports weight is up 1-2 pounds but denies any lower leg edema or SOB.    Diet:  Does not limit salt intake.     Optivol Thoracic impedance suggesting possible fluid accumulation starting 10/25 but trending back toward baseline normal.      Prescribed:  Furosemide 80 mg Take 1 tablet (80 mg total) by mouth daily. Spironolactone 25 mg take 1 tablet by mouth daily Eliquis 5 mg take 1 tablet twice a day  Sliding scale Lasix: Weigh yourself when you get home, then Daily in the Morning.  Provided by Dr Debara Pickett (as indicated 06/23/2022 mychart message) Your dry weight will be what your scale says on the day you return home.(here is 189 lbs.).  If you gain more than 3 pounds from dry weight: Increase the Lasix dosing to 80 mg in the morning and 40 mg in the afternoon until weight returns to baseline dry weight. If weight gain is greater than 5 pounds in 2 days: Increased to Lasix 80 mg twice a day and contact the office for further assistance if weight does not go down the next day. If the weight goes down more than 3 pounds from dry weight: Hold Lasix until it returns to baseline dry weight   Labs: 12/10/2020 Creatinine 1.94, BUN 18, Potassium 4.3, Sodium 141, GFR 39 11/02/2020 Creatinine 1.38, BUN 16, Potassium 4.1, Sodium 141, GFR 51-59 A complete set of results can be found in Results Review.   Recommendations:  Advised to follow sliding scale and take extra 40 mg in the afternoon x 1-2 days and to weigh  consistently every morning.  Advised to avoid restaurant/fast foods if possible due to high salt content.    Follow-up plan: ICM clinic phone appointment on 08/28/2022 to recheck fluid levels.   91 day device clinic remote transmission 09/08/2022.     EP/Cardiology Office Visits:  Recall 01/26/2023 with Dr Curt Bears.  Recall 11/25/2022 with Dr Debara Pickett.   Copy of ICM check sent to Dr. Curt Bears.    3 month ICM trend: 08/07/2022.    12-14 Month ICM trend:     Rosalene Billings, RN 08/09/2022 9:09 AM

## 2022-08-24 ENCOUNTER — Emergency Department (HOSPITAL_COMMUNITY): Payer: Medicare Other

## 2022-08-24 ENCOUNTER — Other Ambulatory Visit: Payer: Self-pay

## 2022-08-24 ENCOUNTER — Inpatient Hospital Stay (HOSPITAL_COMMUNITY)
Admission: EM | Admit: 2022-08-24 | Discharge: 2022-08-27 | DRG: 309 | Disposition: A | Discharge status: Home / Self Care | Payer: Medicare Other | Attending: Internal Medicine | Admitting: Internal Medicine

## 2022-08-24 ENCOUNTER — Encounter (HOSPITAL_COMMUNITY): Payer: Self-pay | Admitting: Nurse Practitioner

## 2022-08-24 DIAGNOSIS — I255 Ischemic cardiomyopathy: Secondary | ICD-10-CM | POA: Diagnosis present

## 2022-08-24 DIAGNOSIS — I4901 Ventricular fibrillation: Secondary | ICD-10-CM | POA: Diagnosis present

## 2022-08-24 DIAGNOSIS — N183 Chronic kidney disease, stage 3 unspecified: Secondary | ICD-10-CM | POA: Diagnosis present

## 2022-08-24 DIAGNOSIS — I5022 Chronic systolic (congestive) heart failure: Secondary | ICD-10-CM | POA: Diagnosis present

## 2022-08-24 DIAGNOSIS — Z8674 Personal history of sudden cardiac arrest: Secondary | ICD-10-CM

## 2022-08-24 DIAGNOSIS — N179 Acute kidney failure, unspecified: Secondary | ICD-10-CM | POA: Diagnosis present

## 2022-08-24 DIAGNOSIS — I10 Essential (primary) hypertension: Secondary | ICD-10-CM | POA: Diagnosis present

## 2022-08-24 DIAGNOSIS — E1122 Type 2 diabetes mellitus with diabetic chronic kidney disease: Secondary | ICD-10-CM | POA: Diagnosis present

## 2022-08-24 DIAGNOSIS — I493 Ventricular premature depolarization: Secondary | ICD-10-CM | POA: Diagnosis present

## 2022-08-24 DIAGNOSIS — Z8249 Family history of ischemic heart disease and other diseases of the circulatory system: Secondary | ICD-10-CM | POA: Diagnosis not present

## 2022-08-24 DIAGNOSIS — I480 Paroxysmal atrial fibrillation: Secondary | ICD-10-CM | POA: Diagnosis present

## 2022-08-24 DIAGNOSIS — E785 Hyperlipidemia, unspecified: Secondary | ICD-10-CM | POA: Diagnosis present

## 2022-08-24 DIAGNOSIS — I130 Hypertensive heart and chronic kidney disease with heart failure and stage 1 through stage 4 chronic kidney disease, or unspecified chronic kidney disease: Secondary | ICD-10-CM | POA: Diagnosis present

## 2022-08-24 DIAGNOSIS — T462X6A Underdosing of other antidysrhythmic drugs, initial encounter: Secondary | ICD-10-CM | POA: Diagnosis present

## 2022-08-24 DIAGNOSIS — I083 Combined rheumatic disorders of mitral, aortic and tricuspid valves: Secondary | ICD-10-CM | POA: Diagnosis present

## 2022-08-24 DIAGNOSIS — I509 Heart failure, unspecified: Secondary | ICD-10-CM

## 2022-08-24 DIAGNOSIS — Z888 Allergy status to other drugs, medicaments and biological substances status: Secondary | ICD-10-CM

## 2022-08-24 DIAGNOSIS — I472 Ventricular tachycardia, unspecified: Secondary | ICD-10-CM | POA: Diagnosis present

## 2022-08-24 DIAGNOSIS — Z4502 Encounter for adjustment and management of automatic implantable cardiac defibrillator: Secondary | ICD-10-CM

## 2022-08-24 DIAGNOSIS — Z95810 Presence of automatic (implantable) cardiac defibrillator: Secondary | ICD-10-CM | POA: Diagnosis not present

## 2022-08-24 DIAGNOSIS — Y92009 Unspecified place in unspecified non-institutional (private) residence as the place of occurrence of the external cause: Secondary | ICD-10-CM

## 2022-08-24 DIAGNOSIS — R9431 Abnormal electrocardiogram [ECG] [EKG]: Secondary | ICD-10-CM | POA: Diagnosis not present

## 2022-08-24 DIAGNOSIS — Z91041 Radiographic dye allergy status: Secondary | ICD-10-CM | POA: Diagnosis not present

## 2022-08-24 DIAGNOSIS — G4733 Obstructive sleep apnea (adult) (pediatric): Secondary | ICD-10-CM | POA: Diagnosis present

## 2022-08-24 DIAGNOSIS — I2510 Atherosclerotic heart disease of native coronary artery without angina pectoris: Secondary | ICD-10-CM | POA: Diagnosis present

## 2022-08-24 DIAGNOSIS — I447 Left bundle-branch block, unspecified: Secondary | ICD-10-CM | POA: Diagnosis present

## 2022-08-24 HISTORY — DX: Chronic systolic (congestive) heart failure: I50.22

## 2022-08-24 HISTORY — DX: Paroxysmal atrial fibrillation: I48.0

## 2022-08-24 HISTORY — DX: Hyperlipidemia, unspecified: E78.5

## 2022-08-24 LAB — BASIC METABOLIC PANEL
Anion gap: 9 (ref 5–15)
BUN: 58 mg/dL — ABNORMAL HIGH (ref 8–23)
CO2: 21 mmol/L — ABNORMAL LOW (ref 22–32)
Calcium: 10.3 mg/dL (ref 8.9–10.3)
Chloride: 105 mmol/L (ref 98–111)
Creatinine, Ser: 2.3 mg/dL — ABNORMAL HIGH (ref 0.61–1.24)
GFR, Estimated: 29 mL/min — ABNORMAL LOW (ref >60)
Glucose, Bld: 107 mg/dL — ABNORMAL HIGH (ref 70–99)
Potassium: 4.2 mmol/L (ref 3.5–5.1)
Sodium: 135 mmol/L (ref 135–145)

## 2022-08-24 LAB — CBC WITH DIFFERENTIAL/PLATELET
Abs Immature Granulocytes: 0.06 10*3/uL (ref 0.00–0.07)
Basophils Absolute: 0 10*3/uL (ref 0.0–0.1)
Basophils Relative: 0 %
Eosinophils Absolute: 0.4 10*3/uL (ref 0.0–0.5)
Eosinophils Relative: 4 %
HCT: 34.6 % — ABNORMAL LOW (ref 39.0–52.0)
Hemoglobin: 11 g/dL — ABNORMAL LOW (ref 13.0–17.0)
Immature Granulocytes: 1 %
Lymphocytes Relative: 14 %
Lymphs Abs: 1.5 10*3/uL (ref 0.7–4.0)
MCH: 29.3 pg (ref 26.0–34.0)
MCHC: 31.8 g/dL (ref 30.0–36.0)
MCV: 92.3 fL (ref 80.0–100.0)
Monocytes Absolute: 0.8 10*3/uL (ref 0.1–1.0)
Monocytes Relative: 8 %
Neutro Abs: 7.4 10*3/uL (ref 1.7–7.7)
Neutrophils Relative %: 73 %
Platelets: 189 10*3/uL (ref 150–400)
RBC: 3.75 MIL/uL — ABNORMAL LOW (ref 4.22–5.81)
RDW: 12.5 % (ref 11.5–15.5)
WBC: 10.1 10*3/uL (ref 4.0–10.5)
nRBC: 0 % (ref 0.0–0.2)

## 2022-08-24 LAB — MAGNESIUM: Magnesium: 1.8 mg/dL (ref 1.7–2.4)

## 2022-08-24 MED ORDER — AMIODARONE HCL IN DEXTROSE 360-4.14 MG/200ML-% IV SOLN
60.0000 mg/h | INTRAVENOUS | Status: AC
  Administered 2022-08-24: 60 mg/h via INTRAVENOUS
  Filled 2022-08-24: qty 200

## 2022-08-24 MED ORDER — HEPARIN (PORCINE) 25000 UT/250ML-% IV SOLN
1200.0000 [IU]/h | INTRAVENOUS | Status: DC
  Administered 2022-08-25: 1200 [IU]/h via INTRAVENOUS
  Filled 2022-08-24: qty 250

## 2022-08-24 MED ORDER — AMIODARONE HCL IN DEXTROSE 360-4.14 MG/200ML-% IV SOLN
30.0000 mg/h | INTRAVENOUS | Status: DC
  Administered 2022-08-25 – 2022-08-26 (×3): 30 mg/h via INTRAVENOUS
  Filled 2022-08-24 (×4): qty 200

## 2022-08-24 MED ORDER — SODIUM CHLORIDE 0.9 % IV BOLUS
500.0000 mL | Freq: Once | INTRAVENOUS | Status: AC
  Administered 2022-08-24: 500 mL via INTRAVENOUS

## 2022-08-24 MED ORDER — SODIUM CHLORIDE 0.9 % IV BOLUS: 1000.0000 mL | Freq: Once | INTRAVENOUS | Status: DC

## 2022-08-24 MED ORDER — AMIODARONE LOAD VIA INFUSION
150.0000 mg | Freq: Once | INTRAVENOUS | Status: AC
  Administered 2022-08-24: 150 mg via INTRAVENOUS
  Filled 2022-08-24: qty 83.34

## 2022-08-24 MED ORDER — MAGNESIUM SULFATE 2 GM/50ML IV SOLN
2.0000 g | Freq: Once | INTRAVENOUS | Status: AC
  Administered 2022-08-24: 2 g via INTRAVENOUS
  Filled 2022-08-24: qty 50

## 2022-08-24 NOTE — Progress Notes (Signed)
ANTICOAGULATION CONSULT NOTE - Initial Consult  Pharmacy Consult for Heparin infusion Indication: atrial fibrillation  Allergies  Allergen Reactions   Contrast Media [Iodinated Contrast Media] Shortness Of Breath and Nausea And Vomiting   Lisinopril Cough    Patient Measurements: Height: '5\' 6"'$  (167.6 cm) Weight: 84.4 kg (186 lb) IBW/kg (Calculated) : 63.8 Heparin Dosing Weight: 81.1 kg  Vital Signs: Temp: 98.1 F (36.7 C) (11/30 2046) Temp Source: Oral (11/30 2046) BP: 134/43 (11/30 2230) Pulse Rate: 73 (11/30 2046)  Labs: Recent Labs    08/24/22 1646  HGB 11.0*  HCT 34.6*  PLT 189  CREATININE 2.30*    CrCl cannot be calculated (Unknown ideal weight.).   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 09/2016 s/p MDT Visia AF MRI single lead ICD (ser # [id number]).   Arthritis    "maybe in his spine" (09/05/2016)   Asthma    pt. denies   Cardiac arrest ([place])    a. 08/2016 VT/VF Arrest-->09/2016 s/p MDT Visia AF MRI single lead ICD (ser # [id number]).   Chronic HFrEF (heart failure with reduced ejection fraction) ([place])    a. 08/2016 Echo: EF 35-40%; b. 04/2018 Echo: EF 30-35%; c. 04/2020 Echo: EF 30-35%; d. 11/2021 Echo: EF 40-45%, GrI DD, nl RV fxn, sev dil LA, mild-mod MR, mild-mod AI, Ao root 45m.   Coronary artery disease    a. 08/2016 VT Arrest/Cath: LM nl, LAD nl, LCX nl, RGCA 90p, 100d w/ L->R collats, EF 35-45%.   Gout    Hyperlipidemia LDL goal <70    Hypertension    Ischemic cardiomyopathy    a. 08/2016 Echo: EF 35-40%; b. 04/2018 Echo: EF 30-35%; c. 04/2020 Echo: EF 30-35%; d. 11/2021 Echo: EF 40-45%.   LBBB (left bundle branch block)    OSA on CPAP    does not wear CPAP   PAF (paroxysmal atrial fibrillation) (HCC)    a. CHA2DS2VASc = 5-->Eliquis.   Type II diabetes mellitus (HCC)     Medications:  (Not in a hospital admission)   Assessment: 71yo M admitted after  defibrillator shock earlier today and ECG showed sinus tach with 1st degree AV block and PVCs with QTc of 466. Pt was initiated on amiodarone gtt and converted to NSR with HR in QTc 458. Pt reports running out of amiodarone for history of paroxysmal AFib about 1 week prior to admission. However, pt reports compliance with apixaban '5mg'$  BID and reports last dose was taken at ~08:00 on 08/24/22.   Pharmacy was consulted to dose and manage heparin infusion for AFib.  Hgb 11.0, Plt 189  Goal of Therapy:  Heparin level 0.3-0.7 units/ml aPTT 66-102 seconds Monitor platelets by anticoagulation protocol: Yes   Plan:  Start heparin infusion at 1200 units/hr. No bolus given recent apixaban administration.  Check aPTT in 8 hours and repeat until heparin level is correlating Monitor daily CBC, aPTT, heparin level, and for s/sx bleeding  CLuisa Hart PharmD, BCPS Clinical Pharmacist 08/24/2022 10:54 PM   [facility] to [facility] phone number

## 2022-08-24 NOTE — ED Notes (Signed)
Contacted MD at this time about patients current BP and MAP. Will continue patient on amiodarone as long as patient MAP remains above 50.

## 2022-08-24 NOTE — ED Provider Triage Note (Signed)
Emergency Medicine Provider Triage Evaluation Note  Dean Furney Sr. , a 71 y.o. male  was evaluated in triage.  Pt complains of shock from defibrillator.  Review of Systems  Positive: Brief episode of chest pain Negative: Dizziness syncope  Physical Exam  There were no vitals taken for this visit. Gen:   Awake, no distress   Resp:  Normal effort  MSK:   Moves extremities without difficulty  Other:  Cardiovascular regular rate  Medical Decision Making  Medically screening exam initiated at 4:36 PM.  Appropriate orders placed.  Dean Bones Sr. was informed that the remainder of the evaluation will be completed by another provider, this initial triage assessment does not replace that evaluation, and the importance of remaining in the ED until their evaluation is complete.     Hayden Rasmussen, MD 08/25/22 1030

## 2022-08-24 NOTE — ED Notes (Signed)
Two unsuccessful IV start attempts by this nurse

## 2022-08-24 NOTE — ED Provider Notes (Signed)
[facility] EMERGENCY DEPARTMENT Provider Note   CSN: [id number] Arrival date & time: 08/24/22  1630     History  Chief Complaint  Patient presents with  . AICD Problem    Dean Hellenbrand Sr. is a 71 y.o. male.  He is here for evaluation after he felt like his defibrillator fired earlier today.  Its never shocked him before.  He said he was working in the yard when he felt a shock, pain in back of neck not chest.  He did not have any preceding symptoms.  He feels at baseline now.  No fevers chills nausea vomiting.  No chest pain or shortness of breath.  He has been taking his medications regularly.  The history is provided by the patient.  Chest Pain Chest pain location: Back of neck. Pain quality comment:  Jolt Pain radiates to:  Does not radiate Pain severity:  Moderate Onset quality:  Sudden Duration: Second. Progression:  Resolved Chronicity:  New Relieved by:  None tried Worsened by:  Nothing Ineffective treatments:  None tried Associated symptoms: no abdominal pain, no cough, no diaphoresis, no fever, no headache, no nausea, no shortness of breath, no syncope and no vomiting   Risk factors: coronary artery disease        Home Medications Prior to Admission medications   Medication Sig Start Date End Date Taking? Authorizing Provider  allopurinol (ZYLOPRIM) 300 MG tablet Take 300 mg by mouth daily. 08/16/16   [provider]  amiodarone (PACERONE) 200 MG tablet Take 0.5 tablets (100 mg total) by mouth daily. 01/31/22   Camnitz, Ocie Doyne, MD  apixaban (ELIQUIS) 5 MG TABS tablet Take 1 tablet (5 mg total) by mouth 2 (two) times daily. 01/31/22   Camnitz, Ocie Doyne, MD  atorvastatin (LIPITOR) 40 MG tablet Take 1 tablet (40 mg total) by mouth daily at 6 PM. 10/27/16   Camnitz, Ocie Doyne, MD  carvedilol (COREG) 12.5 MG tablet TAKE 1 TABLET (12.5 MG TOTAL) BY MOUTH 2 (TWO) TIMES DAILY WITH A MEAL. 12/30/21   Hilty, Nadean Corwin, MD  [employer] VITAMIN D3  1000 units capsule Take 1,000 Units by mouth daily. 08/16/16   [provider]  ENTRESTO 24-26 MG TAKE 1 TABLET BY MOUTH TWICE A DAY 02/06/22   Hilty, Nadean Corwin, MD  furosemide (LASIX) 40 MG tablet Take 1 tablet (40 mg total) by mouth 2 (two) times daily. 09/21/20   Baldwin Jamaica, PA-C  furosemide (LASIX) 80 MG tablet TAKE 1 TABLET BY MOUTH EVERY DAY 02/06/22   Hilty, Nadean Corwin, MD  magnesium oxide (MAG-OX) 400 MG tablet Take 1 tablet (400 mg total) by mouth in the morning AND 0.5 tablets (200 mg total) every evening. 1/2 tablet of '400mg'$ . 01/31/22   Camnitz, Ocie Doyne, MD  spironolactone (ALDACTONE) 25 MG tablet TAKE 1 TABLET BY MOUTH EVERY DAY 08/07/22   Hilty, Nadean Corwin, MD      Allergies    Contrast media [iodinated contrast media] and Lisinopril    Review of Systems   Review of Systems  Constitutional:  Negative for diaphoresis and fever.  HENT:  Negative for sore throat.   Respiratory:  Negative for cough and shortness of breath.   Cardiovascular:  Positive for chest pain. Negative for syncope.  Gastrointestinal:  Negative for abdominal pain, nausea and vomiting.  Genitourinary:  Negative for dysuria.  Skin:  Negative for rash.  Neurological:  Negative for headaches.    Physical Exam Updated Vital Signs BP [name])  176/60 (BP Location: Left Arm)   Pulse (!) 103   Temp 97.7 F (36.5 C)   Resp 16   SpO2 100%  Physical Exam Vitals and nursing note reviewed.  Constitutional:      General: He is not in acute distress.    Appearance: Normal appearance. He is well-developed.  HENT:     Head: Normocephalic and atraumatic.  Eyes:     Conjunctiva/sclera: Conjunctivae normal.  Cardiovascular:     Rate and Rhythm: Normal rate and regular rhythm.     Heart sounds: No murmur heard. Pulmonary:     Effort: Pulmonary effort is normal. No respiratory distress.     Breath sounds: Normal breath sounds.  Abdominal:     Palpations: Abdomen is soft.     Tenderness: There is no  abdominal tenderness. There is no guarding or rebound.  Musculoskeletal:        General: Normal range of motion.     Cervical back: Neck supple.  Skin:    General: Skin is warm and dry.     Capillary Refill: Capillary refill takes less than 2 seconds.  Neurological:     General: No focal deficit present.     Mental Status: He is alert.     ED Results / Procedures / Treatments   Labs (all labs ordered are listed, but only abnormal results are displayed) Labs Reviewed  BASIC METABOLIC PANEL - Abnormal; Notable for the following components:      Result Value   CO2 21 (*)    Glucose, Bld 107 (*)    BUN 58 (*)    Creatinine, Ser 2.30 (*)    GFR, Estimated 29 (*)    All other components within normal limits  CBC WITH DIFFERENTIAL/PLATELET - Abnormal; Notable for the following components:   RBC 3.75 (*)    Hemoglobin 11.0 (*)    HCT 34.6 (*)    All other components within normal limits  MAGNESIUM - Abnormal; Notable for the following components:   Magnesium 3.1 (*)    All other components within normal limits  APTT - Abnormal; Notable for the following components:   aPTT 44 (*)    All other components within normal limits  CBC - Abnormal; Notable for the following components:   RBC 2.88 (*)    Hemoglobin 8.5 (*)    HCT 26.3 (*)    Platelets 145 (*)    All other components within normal limits  COMPREHENSIVE METABOLIC PANEL - Abnormal; Notable for the following components:   CO2 21 (*)    Glucose, Bld 119 (*)    BUN 50 (*)    Creatinine, Ser 2.10 (*)    Total Protein 5.7 (*)    Albumin 3.2 (*)    Total Bilirubin <0.1 (*)    GFR, Estimated 33 (*)    All other components within normal limits  LIPID PANEL - Abnormal; Notable for the following components:   HDL 33 (*)    All other components within normal limits  CBC - Abnormal; Notable for the following components:   RBC 3.00 (*)    Hemoglobin 8.8 (*)    HCT 27.7 (*)    Platelets 135 (*)    All other components within  normal limits  TROPONIN I (HIGH SENSITIVITY) - Abnormal; Notable for the following components:   Troponin I (High Sensitivity) 37 (*)    All other components within normal limits  TROPONIN I (HIGH SENSITIVITY) - Abnormal; Notable for the following  components:   Troponin I (High Sensitivity) 35 (*)    All other components within normal limits  MAGNESIUM  HEPARIN LEVEL (UNFRACTIONATED)  TSH  HEMOGLOBIN A1C  APTT  CBG MONITORING, ED    EKG EKG Interpretation  Date/Time:  Thursday August 24 2022 16:20:19 EST Ventricular Rate:  103 PR Interval:  226 QRS Duration: 144 QT Interval:  356 QTC Calculation: 466 R Axis:   -15 Text Interpretation: Sinus tachycardia with 1st degree A-V block with frequent Premature ventricular complexes Left bundle branch block Abnormal ECG When compared with ECG of 12-Aug-2020 05:24, increased ectopy and first degree block Confirmed by Aletta Edouard [phone]) on 08/24/2022 4:43:38 PM  Radiology DG Chest 2 View  Result Date: 08/24/2022 CLINICAL DATA:  Defibrillator fired EXAM: CHEST - 2 VIEW COMPARISON:  08/20/2020 FINDINGS: Frontal and lateral views of the chest demonstrate an unremarkable cardiac silhouette. Single lead AICD unchanged. No acute airspace disease, effusion, or pneumothorax. No acute bony abnormalities. IMPRESSION: 1. No acute intrathoracic process. Electronically Signed   By: Randa Ngo M.D.   On: 08/24/2022 17:15    Procedures .Critical Care  Performed by: Hayden Rasmussen, MD Authorized by: Hayden Rasmussen, MD   Critical care provider statement:    Critical care time (minutes):  45   Critical care time was exclusive of:  Separately billable procedures and treating other patients   Critical care was necessary to treat or prevent imminent or life-threatening deterioration of the following conditions:  Cardiac failure   Critical care was time spent personally by me on the following activities:  Development of treatment plan with  patient or surrogate, discussions with consultants, evaluation of patient's response to treatment, examination of patient, obtaining history from patient or surrogate, ordering and performing treatments and interventions, ordering and review of laboratory studies, ordering and review of radiographic studies, pulse oximetry, re-evaluation of patient's condition and review of old charts   I assumed direction of critical care for this patient from another provider in my specialty: no       Medications Ordered in ED Medications  amiodarone (NEXTERONE) 1.8 mg/mL load via infusion 150 mg (150 mg Intravenous Bolus from Bag 08/24/22 2041)    Followed by  amiodarone (NEXTERONE PREMIX) 360-4.14 MG/200ML-% (1.8 mg/mL) IV infusion (0 mg/hr Intravenous Stopped 08/25/22 0149)    Followed by  amiodarone (NEXTERONE PREMIX) 360-4.14 MG/200ML-% (1.8 mg/mL) IV infusion (30 mg/hr Intravenous Rate/Dose Verify 08/25/22 0738)  allopurinol (ZYLOPRIM) tablet 300 mg (300 mg Oral Given 08/25/22 0933)  atorvastatin (LIPITOR) tablet 40 mg (has no administration in time range)  carvedilol (COREG) tablet 12.5 mg (12.5 mg Oral Given 08/25/22 0846)  sacubitril-valsartan (ENTRESTO) 24-26 mg per tablet (1 tablet Oral Not Given 08/25/22 1011)  spironolactone (ALDACTONE) tablet 25 mg (25 mg Oral Given 08/25/22 0933)  magnesium oxide (MAG-OX) tablet 400 mg (400 mg Oral Given 08/25/22 0933)  nitroGLYCERIN (NITROSTAT) SL tablet 0.4 mg (has no administration in time range)  acetaminophen (TYLENOL) tablet 650 mg (has no administration in time range)  ondansetron (ZOFRAN) injection 4 mg (has no administration in time range)  sodium chloride flush (NS) 0.9 % injection 3 mL (3 mLs Intravenous Given 08/25/22 1008)  sodium chloride flush (NS) 0.9 % injection 3 mL (has no administration in time range)  0.9 %  sodium chloride infusion (has no administration in time range)  aspirin EC tablet 81 mg (81 mg Oral Given 08/25/22 0932)  heparin ADULT  infusion 100 units/mL (25000 units/279m) (1,200 Units/hr Intravenous Rate/Dose  Verify 08/25/22 0738)  0.9 %  sodium chloride infusion ( Intravenous New Bag/Given 08/25/22 0846)  sodium chloride 0.9 % bolus 500 mL (0 mLs Intravenous Stopped 08/24/22 2139)  aspirin chewable tablet 324 mg (324 mg Oral Given 08/25/22 0430)    Or  aspirin suppository 300 mg ( Rectal See Alternative 08/25/22 0430)  magnesium sulfate IVPB 2 g 50 mL (0 g Intravenous Stopped 08/25/22 0542)  magnesium sulfate IVPB 2 g 50 mL (0 g Intravenous Stopped 08/24/22 2343)    ED Course/ Medical Decision Making/ A&P Clinical Course as of 08/25/22 1036  Thu Aug 24, 2022  1734 Chest x-ray interpreted by me as no acute infiltrates.  AICD in place.  Awaiting radiology reading. [MB]  1920 [employer] reading from AICD showed a delivered inappropriate shock for ventricular fibrillation at approximate rate of 280. [MB]  1945 Discussed with Dr. Margaretann Loveless cardiology.  She said he will need to be admitted to the hospital.  We will have somebody come down to evaluate and make further recommendations. [MB]  2026 Patient seen by cardiology.  They would like the patient started on IV amiodarone.  They felt that that was not a contraindication despite the allergy to contrast. [MB]    Clinical Course User Index [MB] Hayden Rasmussen, MD                           Medical Decision Making Amount and/or Complexity of Data Reviewed Labs: ordered. Radiology: ordered.  Risk Prescription drug management. Decision regarding hospitalization.   This patient complains of possible defibrillator fire; this involves an extensive number of treatment Options and is a complaint that carries with it a high risk of complications and morbidity. The differential includes AICD shock, V. tach, V-fib, ischemia, musculoskeletal pain  I ordered, reviewed and interpreted labs, which included CBC with normal white count, hemoglobin lower than baseline, chemistries with  new AKI, normal potassium and magnesium I ordered medication IV amiodarone drip IV fluids and reviewed PMP when indicated. I ordered imaging studies which included chest x-ray and I independently    visualized and interpreted imaging which showed no acute findings Additional history obtained from patient's family members Previous records obtained and reviewed in epic including recent cardiology notes I consulted cardiology Dr. Margaretann Loveless and discussed lab and imaging findings and discussed disposition.  Cardiac monitoring reviewed, sinus rhythm first-degree block Social determinants considered, no significant barriers Critical Interventions: Initiation of IV amiodarone to prevent recurrence of ventricular fibrillation  After the interventions stated above, I reevaluated the patient and found awake alert in no distress Admission and further testing considered, cardiology is recommending admission patient agreeable to plan          Final Clinical Impression(s) / ED Diagnoses Final diagnoses:  Ventricular fibrillation [facility])  AICD discharge    Rx / DC Orders ED Discharge Orders     None         Hayden Rasmussen, MD 08/25/22 1036

## 2022-08-24 NOTE — ED Triage Notes (Signed)
Patient here with complaint of having is AICD firing one time at approximately 1545 today. Patient states he felt normal prior to the shock, denies dizziness, denies chest pain, denies weakness. Patient is alert, oriented, and in no apparent distress.

## 2022-08-24 NOTE — H&P (Addendum)
History & Physical    Patient ID: Dean Whobrey Sr. 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  Past Medical History    Past Medical History:  Diagnosis Date   AICD (automatic cardioverter/defibrillator) present 10/10/2016   a. 09/2016 s/p MDT Visia AF MRI single lead ICD (ser # [id number]).   Arthritis    "maybe in his spine" (09/05/2016)   Asthma    pt. denies   Cardiac arrest ([place])    a. 08/2016 VT/VF Arrest-->09/2016 s/p MDT Visia AF MRI single lead ICD (ser # [id number]).   Chronic HFrEF (heart failure with reduced ejection fraction) ([place])    a. 08/2016 Echo: EF 35-40%; b. 04/2018 Echo: EF 30-35%; c. 04/2020 Echo: EF 30-35%; d. 11/2021 Echo: EF 40-45%, GrI DD, nl RV fxn, sev dil LA, mild-mod MR, mild-mod AI, Ao root 75m.   Coronary artery disease    a. 08/2016 VT Arrest/Cath: LM nl, LAD nl, LCX nl, RGCA 90p, 100d w/ L->R collats, EF 35-45%.   Gout    Hyperlipidemia LDL goal <70    Hypertension    Ischemic cardiomyopathy    a. 08/2016 Echo: EF 35-40%; b. 04/2018 Echo: EF 30-35%; c. 04/2020 Echo: EF 30-35%; d. 11/2021 Echo: EF 40-45%.   LBBB (left bundle branch block)    OSA on CPAP    does not wear CPAP   PAF (paroxysmal atrial fibrillation) (HCC)    a. CHA2DS2VASc = 5-->Eliquis.   Type II diabetes mellitus ([place]     Past Surgical History:  Procedure Laterality Date   CARDIAC CATHETERIZATION N/A 09/07/2016   Procedure: Left Heart Cath and Coronary  Angiography;  Surgeon: Peter M JMartinique MD;  [facility];  Service: Cardiovascular;  Laterality: N/A;   COLONOSCOPY WITH PROPOFOL N/A 08/30/2018   Procedure: COLONOSCOPY WITH PROPOFOL;  Surgeon: HCarol Ada MD;  Location: [facility];  Service: Endoscopy;  Laterality: N/A;   EP IMPLANTABLE DEVICE N/A 10/10/2016   Procedure: ICD Implant;  Surgeon: Will MMeredith Leeds MD;  [facility];  Service: Cardiovascular;  Laterality: N/A;   LUMBAR LAMINECTOMY/DECOMPRESSION MICRODISCECTOMY N/A 01/18/2018   Procedure: L2-3, L3-4, L-4-5 CENTRAL DECOMPRESSION;  Surgeon: YMarybelle Killings MD;  [facility]  Service: Orthopedics;  Laterality: N/A;   LUMBAR LAMINECTOMY/DECOMPRESSION MICRODISCECTOMY N/A 08/12/2020   Procedure: THORACIC ELEVEN LAMINECTOMY;  Surgeon: PEarnie Larsson MD;  [facility]  Service: Neurosurgery;  Laterality: N/A;   POLYPECTOMY  08/30/2018   Procedure: POLYPECTOMY;  Surgeon: HCarol Ada MD;  Location: [facility];  Service: Endoscopy;;     Allergies  Allergies  Allergen Reactions   Contrast Media [Iodinated Contrast Media] Shortness Of Breath and Nausea And Vomiting   Lisinopril Cough    History of Present Illness    71year old male with history of ischemic cardiomyopathy, chronic HFrEF, coronary artery disease, VT/VF arrest, hypertension, hyperlipidemia, diabetes, paroxysmal atrial fibrillation, left bundle branch block, and sleep apnea.  In December 2017, patient suffered a VT/VF arrest requiring defibrillation and CPR.  He had a prolonged hospitalization.  He underwent diagnostic catheterization  which showed an occluded distal right coronary artery with otherwise normal coronary arteries.  He has left-to-right collaterals to the distal RCA.  EF was 35 to 40% by echo.  He was seen by electrophysiology, placed on amiodarone therapy, and subsequently underwent placement of a [employer]onic single-lead AICD.  He has since been managed in the outpatient  setting with GDMT.  He has been followed closely by our device nurse with sliding scale Lasix adjustments based on OptiVol numbers.  Mr. Stanbery lives locally with his daughters and grandchildren.  He is retired but remains active around his home without discharge.  He and his son are currently in the process of building a building for the church.  He does not experience chest pain, dyspnea, and has not any recent change in activity tolerance.  He was notified by our device nurse on November 15 has OptiVol numbers were trending higher, suggesting volume overload, and for 3 days last week, he took an additional 40 mg of Lasix (usually takes 80 mg once daily).  He says his weight has been stable around 186 pounds.  Over the past month or so, he notes that when he is lying in bed, he sometimes hears skipped heartbeat, though is otherwise asymptomatic.  He was in his usual state of health earlier today, when he was building a deck at his church, with his son, landed without any prodromal symptoms or warning, he suddenly felt a thump to the back of his neck.  He assumed that his ICD fired, though he notes he did not feel anything in his chest.  He denies any presyncope/syncope, chest pain, or dyspnea surrounding the event.  Out of concern for an ICD discharge, he presented to the emergency department.  Here, ICD interrogation shows VT/VF with a maximum rate of 231 bpm, median rate of 222 bpm.  He had 2 aborted discharges followed by 1 successful discharge at 36.3 J.  Lab work notable for acute kidney injury with a creatinine of 2.3 (1.3 02 August 2021), and mild hypomagnesemia at 1.8.  Potassium is normal at 4.2.  Patient is currently asymptomatic and has been so ever since the ICD shock.  Upon discussing the patient's medications, he indicates that he has been out of his amiodarone for at least a week.  Home Medications    Prior to Admission medications   Medication Sig Start Date End Date Taking? Authorizing  Provider  allopurinol (ZYLOPRIM) 300 MG tablet Take 300 mg by mouth daily. 08/16/16   [provider]  amiodarone (PACERONE) 200 MG tablet Take 0.5 tablets (100 mg total) by mouth daily. 01/31/22   Camnitz, Ocie Doyne, MD  apixaban (ELIQUIS) 5 MG TABS tablet Take 1 tablet (5 mg total) by mouth 2 (two) times daily. 01/31/22   Camnitz, Ocie Doyne, MD  atorvastatin (LIPITOR) 40 MG tablet Take 1 tablet (40 mg total) by mouth daily at 6 PM. 10/27/16   Camnitz, Ocie Doyne, MD  carvedilol (COREG) 12.5 MG tablet TAKE 1 TABLET (12.5 MG TOTAL) BY MOUTH 2 (TWO) TIMES DAILY WITH A MEAL. 12/30/21   Hilty, Nadean Corwin, MD  [employer] VITAMIN D3 1000 units capsule Take 1,000 Units by mouth daily. 08/16/16   [provider]  ENTRESTO 24-26 MG TAKE 1 TABLET BY MOUTH TWICE A DAY 02/06/22   Hilty, Nadean Corwin, MD  furosemide (LASIX) 40 MG tablet Take 1 tablet (40 mg total) by mouth 2 (two) times daily. 09/21/20   Baldwin Jamaica, PA-C  furosemide (LASIX)  80 MG tablet TAKE 1 TABLET BY MOUTH EVERY DAY 02/06/22   Hilty, Nadean Corwin, MD  magnesium oxide (MAG-OX) 400 MG tablet Take 1 tablet (400 mg total) by mouth in the morning AND 0.5 tablets (200 mg total) every evening. 1/2 tablet of '400mg'$ . 01/31/22   Camnitz, Ocie Doyne, MD  spironolactone (ALDACTONE) 25 MG tablet TAKE 1 TABLET BY MOUTH EVERY DAY 08/07/22   Hilty, Nadean Corwin, MD    Family History    Family History  Problem Relation Age of Onset   Heart attack Father        died in his 27's   Hypertension Sister    Cancer Brother        uncertain, "leg"   Hypertension Sister    He indicated that his father is deceased. He indicated that both of his sisters are alive. He indicated that his brother is deceased.   Social History    Social History   Socioeconomic History   Marital status: Widowed    Spouse name: Not on file   Number of children: Not on file   Years of education: Not on file   Highest education level: Not on file  Occupational History    Occupation: semi-retired Museum/gallery curator  Tobacco Use   Smoking status: Never   Smokeless tobacco: Never  Vaping Use   Vaping Use: Never used  Substance and Sexual Activity   Alcohol use: No   Drug use: No   Sexual activity: Not on file  Other Topics Concern   Not on file  Social History Narrative   Not on file   Social Determinants of Health   Financial Resource Strain: Not on file  Food Insecurity: Not on file  Transportation Needs: Not on file  Physical Activity: Not on file  Stress: Not on file  Social Connections: Not on file  Intimate Partner Violence: Not on file     Review of Systems    General:  No chills, fever, night sweats or weight changes.  Cardiovascular:  No chest pain, dyspnea on exertion, edema, orthopnea, palpitations, paroxysmal nocturnal dyspnea. Dermatological: No rash, lesions/masses Respiratory: No cough, dyspnea Urologic: No hematuria, dysuria Abdominal:   No nausea, vomiting, diarrhea, bright red blood per rectum, melena, or hematemesis Neurologic:  No visual changes, wkns, changes in mental status. All other systems reviewed and are otherwise negative except as noted above.  Physical Exam    Blood pressure (!) 176/60, pulse (!) 103, temperature 97.7 F (36.5 C), resp. rate 20, SpO2 100 %.  General: Pleasant, NAD Psych: Normal affect. Neuro: Alert and oriented X 3. Moves all extremities spontaneously. HEENT: Normal  Neck: Supple without bruits or JVD. Lungs:  Resp regular and unlabored, CTA. Heart: RRR 2/6 systolic murmur heard throughout.  No rubs or murmurs.   Abdomen: Soft, non-tender, non-distended, BS + x 4.  Extremities: No clubbing, cyanosis or edema. DP/PT 2+, Radials 2+ and equal bilaterally.  Labs    Cardiac Enzymes No results for input(s): "TROPONINIHS" in the last 720 hours.    Lab Results  Component Value Date   WBC 10.1 08/24/2022   HGB 11.0 (L) 08/24/2022   HCT 34.6 (L) 08/24/2022   MCV 92.3 08/24/2022   PLT 189 08/24/2022     Recent Labs  Lab 08/24/22 1646  NA 135  K 4.2  CL 105  CO2 21*  BUN 58*  CREATININE 2.30*  CALCIUM 10.3  GLUCOSE 107*   Lab Results  Component Value Date   CHOL  159 09/07/2016   HDL 25 (L) 09/07/2016   LDLCALC 105 (H) 09/07/2016   TRIG 146 09/07/2016    BNP    Component Value Date/Time   BNP 318.7 (H) 01/23/2020 1423   BNP 560.6 (H) 11/15/2019 2032    ProBNP    Component Value Date/Time   PROBNP 1,529.0 (H) 12/16/2012 0106      Radiology Studies    DG Chest 2 View  Result Date: 08/24/2022 CLINICAL DATA:  Defibrillator fired EXAM: CHEST - 2 VIEW COMPARISON:  08/20/2020 FINDINGS: Frontal and lateral views of the chest demonstrate an unremarkable cardiac silhouette. Single lead AICD unchanged. No acute airspace disease, effusion, or pneumothorax. No acute bony abnormalities. IMPRESSION: 1. No acute intrathoracic process. Electronically Signed   By: Randa Ngo M.D.   On: 08/24/2022 17:15    ECG & Cardiac Imaging    Sinus tachycardia, 103, 1st deg avb, LBBB - personally reviewed.  Assessment & Plan    1.  VT/VF: History of VT/VF arrest in 2017 with finding of an occluded distal RCA and otherwise nonobstructive disease.  He was medically managed from a CAD standpoint.  Single-lead ICD was placed in January 2018, and he has been on oral amiodarone since.  Today, he was doing some work at his church and without any warning, he experienced an ICD discharge, which he described as a thump to the back of his head.  He denies any presyncope, syncope, chest pain, dyspnea prior to or following the event.  ICD interrogation shows VT/VF occurring at 1641 today with a maximum rate of 231 bpm, median rate of 220 bpm.  Duration of episode was 42 seconds.  There were 2 aborted ICD shocks followed by successful 36.3 J shock.  Because of ICD shock, he presented to the ED today.  Lab work notable for acute kidney injury with a creatinine of 2.3 and BUN of 58.  Magnesium is only  slightly low at 1.8.  Potassium is normal at 4.2.  Patient reports that he has been off of amiodarone for at least a week.  He was previously taking 100 mg daily.  Intravenous amiodarone bolus and infusion have been ordered.  I will supplement magnesium.  Follow-up echo.  Follow-up troponins and follow trend to assess for ischemia as possible cause of current event.  May need diagnostic catheterization pending echo and troponin trend (contrast allergy-anaphylaxis; also on Eliquis - will hold just in case).  2.  Chronic heart failure with reduced ejection fraction/ischemic cardiomyopathy: EF previously as low as 30 to 35% but improved to 40 to 45% by echo in March 2023.  He has required sliding scale adjustment of Lasix in the outpatient setting based on elevated OptiVol numbers over the course of the year with elevated numbers last week, prompting him to take an additional 40 mg of Lasix daily.  Continue beta-blocker, Entresto, and spironolactone.  Currently appears euvolemic on examination with stable weight at home.  Pending improvement in renal function, would add SGLT2 inhibitor.  Follow-up echocardiogram.  3.  Essential hypertension: Pressure currently elevated.  He has not taken his usual evening medications.  Resume home meds and follow.  4.  Hyperlipidemia: LDL of 105 in 2017 and not checked in our system since then.  Continue statin therapy and follow-up lipids and LFTs.  5.  Type 2 diabetes mellitus: Appears to be diet controlled.  Add sliding scale insulin.  Follow-up A1c.  6.  AKI: Creatinine 1.28 July 2021 and currently 2.3.  He  did increase his Lasix for 3 days last week.  I will hold Lasix for tomorrow.  He has received gentle hydration in the ED.  7.  Paroxysmal atrial fibrillation: Maintaining sinus rhythm.  Resume amiodarone.  Hold Eliquis for now and cover with heparin in case patient requires diagnostic catheterization.  8.  Hypomagnesemia: Patient has chronic hypomagnesemia and  uses magnesium oxide oral tablets at home.  Magnesium 1.8.  Supplemented in the setting of above.  Potassium is normal.  Signed, Murray Hodgkins, NP 08/24/2022, 8:39 PM  Patient seen and examined with Ignacia Bayley NP.  Agree as above, with the following exceptions and changes as noted below. 71 yo male with history of VT VF maintained on amiodarone who presents with defibrillator therapy in setting of detected VT in VF range, after running out of his amiodarone for 1 week, and taking additional diuresis for the last 3 days. He has noticed more palpitations in the last week but no CP or SOB/DOE. Gen: NAD, CV: RRR, 4-3/3 systolic murmur, sounds holosystolic over left precordium Lungs: clear, Abd: soft, Extrem: Warm, well perfused, no edema, Neuro/Psych: alert and oriented x 3, normal mood and affect. All available labs, radiology testing, previous records reviewed. Patient with therapy from device for VT in VF range. Will give amiodarone IV for reloading and monitor for recurrence of arrhythmia on telemetry. Will replete Mg for goal >2. AKI present in setting of recent diuretic increase, query whether lack of amiodarone plus electrolyte depletion may have been the nidus for VT. Will check echo, systolic murmur may be MR. With increase in diuretic use, screen for ischemia, troponin has been ordered and will be interpreted in context of defibrillation.   Elouise Munroe, MD 08/24/22 9:13 PM

## 2022-08-25 ENCOUNTER — Encounter (HOSPITAL_COMMUNITY): Payer: Self-pay | Admitting: Internal Medicine

## 2022-08-25 ENCOUNTER — Inpatient Hospital Stay (HOSPITAL_COMMUNITY): Payer: Medicare Other

## 2022-08-25 ENCOUNTER — Other Ambulatory Visit: Payer: Self-pay

## 2022-08-25 DIAGNOSIS — R9431 Abnormal electrocardiogram [ECG] [EKG]: Secondary | ICD-10-CM | POA: Diagnosis not present

## 2022-08-25 DIAGNOSIS — I472 Ventricular tachycardia, unspecified: Secondary | ICD-10-CM

## 2022-08-25 LAB — APTT
aPTT: 154 seconds — ABNORMAL HIGH (ref 24–36)
aPTT: 44 seconds — ABNORMAL HIGH (ref 24–36)

## 2022-08-25 LAB — ECHOCARDIOGRAM COMPLETE
AR max vel: 2.18 cm2
AV Area VTI: 2.35 cm2
AV Area mean vel: 2.08 cm2
AV Mean grad: 5 mmHg
AV Peak grad: 9.1 mmHg
Ao pk vel: 1.51 m/s
Area-P 1/2: 3.08 cm2
Calc EF: 35.6 %
Height: 66 in
MV M vel: 4.74 m/s
MV Peak grad: 89.9 mmHg
P 1/2 time: 639 msec
Radius: 0.5 cm
S' Lateral: 5.2 cm
Single Plane A2C EF: 37.5 %
Single Plane A4C EF: 31.1 %
Weight: 2976 oz

## 2022-08-25 LAB — CBC
HCT: 26.3 % — ABNORMAL LOW (ref 39.0–52.0)
HCT: 27.7 % — ABNORMAL LOW (ref 39.0–52.0)
Hemoglobin: 8.5 g/dL — ABNORMAL LOW (ref 13.0–17.0)
Hemoglobin: 8.8 g/dL — ABNORMAL LOW (ref 13.0–17.0)
MCH: 29.5 pg (ref 26.0–34.0)
MCH: 29.3 pg (ref 26.0–34.0)
MCHC: 32.3 g/dL (ref 30.0–36.0)
MCHC: 31.8 g/dL (ref 30.0–36.0)
MCV: 91.3 fL (ref 80.0–100.0)
MCV: 92.3 fL (ref 80.0–100.0)
Platelets: 145 10*3/uL — ABNORMAL LOW (ref 150–400)
Platelets: 135 10*3/uL — ABNORMAL LOW (ref 150–400)
RBC: 2.88 MIL/uL — ABNORMAL LOW (ref 4.22–5.81)
RBC: 3 MIL/uL — ABNORMAL LOW (ref 4.22–5.81)
RDW: 12.6 % (ref 11.5–15.5)
RDW: 12.5 % (ref 11.5–15.5)
WBC: 8.4 10*3/uL (ref 4.0–10.5)
WBC: 8.5 10*3/uL (ref 4.0–10.5)
nRBC: 0 % (ref 0.0–0.2)
nRBC: 0 % (ref 0.0–0.2)

## 2022-08-25 LAB — COMPREHENSIVE METABOLIC PANEL
ALT: 10 U/L (ref 0–44)
AST: 15 U/L (ref 15–41)
Albumin: 3.2 g/dL — ABNORMAL LOW (ref 3.5–5.0)
Alkaline Phosphatase: 63 U/L (ref 38–126)
Anion gap: 11 (ref 5–15)
BUN: 50 mg/dL — ABNORMAL HIGH (ref 8–23)
CO2: 21 mmol/L — ABNORMAL LOW (ref 22–32)
Calcium: 9.7 mg/dL (ref 8.9–10.3)
Chloride: 105 mmol/L (ref 98–111)
Creatinine, Ser: 2.1 mg/dL — ABNORMAL HIGH (ref 0.61–1.24)
GFR, Estimated: 33 mL/min — ABNORMAL LOW (ref >60)
Glucose, Bld: 119 mg/dL — ABNORMAL HIGH (ref 70–99)
Potassium: 3.9 mmol/L (ref 3.5–5.1)
Sodium: 137 mmol/L (ref 135–145)
Total Bilirubin: <0.1 mg/dL — ABNORMAL LOW (ref 0.3–1.2)
Total Protein: 5.7 g/dL — ABNORMAL LOW (ref 6.5–8.1)

## 2022-08-25 LAB — LIPID PANEL
Cholesterol: 103 mg/dL (ref 0–200)
HDL: 33 mg/dL — ABNORMAL LOW (ref >40)
LDL Cholesterol: 63 mg/dL (ref 0–99)
Total CHOL/HDL Ratio: 3.1 RATIO
Triglycerides: 34 mg/dL (ref <150)
VLDL: 7 mg/dL (ref 0–40)

## 2022-08-25 LAB — GLUCOSE, CAPILLARY
Glucose-Capillary: 98 mg/dL (ref 70–99)
Glucose-Capillary: 136 mg/dL — ABNORMAL HIGH (ref 70–99)
Glucose-Capillary: 104 mg/dL — ABNORMAL HIGH (ref 70–99)

## 2022-08-25 LAB — MAGNESIUM: Magnesium: 3.1 mg/dL — ABNORMAL HIGH (ref 1.7–2.4)

## 2022-08-25 LAB — CBG MONITORING, ED: Glucose-Capillary: 97 mg/dL (ref 70–99)

## 2022-08-25 LAB — HEPARIN LEVEL (UNFRACTIONATED): Heparin Unfractionated: 0.69 IU/mL (ref 0.30–0.70)

## 2022-08-25 LAB — TROPONIN I (HIGH SENSITIVITY)
Troponin I (High Sensitivity): 37 ng/L — ABNORMAL HIGH (ref <18)
Troponin I (High Sensitivity): 35 ng/L — ABNORMAL HIGH (ref <18)

## 2022-08-25 LAB — TSH: TSH: 1.977 u[IU]/mL (ref 0.350–4.500)

## 2022-08-25 MED ORDER — ORAL CARE MOUTH RINSE: 15.0000 mL | OROMUCOSAL | Status: DC | PRN

## 2022-08-25 MED ORDER — ASPIRIN 300 MG RE SUPP: 300.0000 mg | RECTAL | Status: AC

## 2022-08-25 MED ORDER — SACUBITRIL-VALSARTAN 24-26 MG PO TABS: 1.0000 | ORAL_TABLET | Freq: Two times a day (BID) | ORAL | Status: DC

## 2022-08-25 MED ORDER — CARVEDILOL 12.5 MG PO TABS
12.5000 mg | ORAL_TABLET | Freq: Two times a day (BID) | ORAL | Status: DC
  Administered 2022-08-25 – 2022-08-27 (×5): 12.5 mg via ORAL
  Filled 2022-08-25 (×5): qty 1

## 2022-08-25 MED ORDER — HEPARIN (PORCINE) 25000 UT/250ML-% IV SOLN
1000.0000 [IU]/h | INTRAVENOUS | Status: AC
  Administered 2022-08-25: 1000 [IU]/h via INTRAVENOUS

## 2022-08-25 MED ORDER — MAGNESIUM SULFATE 2 GM/50ML IV SOLN
2.0000 g | Freq: Once | INTRAVENOUS | Status: AC
  Administered 2022-08-25: 2 g via INTRAVENOUS
  Filled 2022-08-25: qty 50

## 2022-08-25 MED ORDER — ALLOPURINOL 300 MG PO TABS
300.0000 mg | ORAL_TABLET | Freq: Every day | ORAL | Status: DC
  Administered 2022-08-25 – 2022-08-27 (×3): 300 mg via ORAL
  Filled 2022-08-25 (×3): qty 1

## 2022-08-25 MED ORDER — NITROGLYCERIN 0.4 MG SL SUBL: 0.4000 mg | SUBLINGUAL_TABLET | SUBLINGUAL | Status: DC | PRN

## 2022-08-25 MED ORDER — SODIUM CHLORIDE 0.9% FLUSH: 3.0000 mL | INTRAVENOUS | Status: DC | PRN

## 2022-08-25 MED ORDER — APIXABAN 5 MG PO TABS
5.0000 mg | ORAL_TABLET | Freq: Two times a day (BID) | ORAL | Status: DC
  Administered 2022-08-25 – 2022-08-27 (×4): 5 mg via ORAL
  Filled 2022-08-25 (×4): qty 1

## 2022-08-25 MED ORDER — SODIUM CHLORIDE 0.9 % IV SOLN: INTRAVENOUS | Status: AC

## 2022-08-25 MED ORDER — ATORVASTATIN CALCIUM 40 MG PO TABS
40.0000 mg | ORAL_TABLET | Freq: Every day | ORAL | Status: DC
  Administered 2022-08-25 – 2022-08-26 (×2): 40 mg via ORAL
  Filled 2022-08-25 (×2): qty 1

## 2022-08-25 MED ORDER — ASPIRIN 81 MG PO CHEW
324.0000 mg | CHEWABLE_TABLET | ORAL | Status: AC
  Administered 2022-08-25: 324 mg via ORAL
  Filled 2022-08-25: qty 4

## 2022-08-25 MED ORDER — SPIRONOLACTONE 25 MG PO TABS
25.0000 mg | ORAL_TABLET | Freq: Every day | ORAL | Status: DC
  Administered 2022-08-25 – 2022-08-27 (×3): 25 mg via ORAL
  Filled 2022-08-25 (×4): qty 1

## 2022-08-25 MED ORDER — ASPIRIN 81 MG PO TBEC
81.0000 mg | DELAYED_RELEASE_TABLET | Freq: Every day | ORAL | Status: DC
  Administered 2022-08-25 – 2022-08-27 (×3): 81 mg via ORAL
  Filled 2022-08-25 (×3): qty 1

## 2022-08-25 MED ORDER — ACETAMINOPHEN 325 MG PO TABS: 650.0000 mg | ORAL_TABLET | ORAL | Status: DC | PRN

## 2022-08-25 MED ORDER — MAGNESIUM OXIDE -MG SUPPLEMENT 400 (240 MG) MG PO TABS
400.0000 mg | ORAL_TABLET | Freq: Two times a day (BID) | ORAL | Status: DC
  Administered 2022-08-25 – 2022-08-27 (×5): 400 mg via ORAL
  Filled 2022-08-25 (×5): qty 1

## 2022-08-25 MED ORDER — SODIUM CHLORIDE 0.9 % IV SOLN: 250.0000 mL | INTRAVENOUS | Status: DC | PRN

## 2022-08-25 MED ORDER — ONDANSETRON HCL 4 MG/2ML IJ SOLN: 4.0000 mg | Freq: Four times a day (QID) | INTRAMUSCULAR | Status: DC | PRN

## 2022-08-25 MED ORDER — PERFLUTREN LIPID MICROSPHERE
1.0000 mL | INTRAVENOUS | Status: AC | PRN
  Administered 2022-08-25: 2 mL via INTRAVENOUS

## 2022-08-25 MED ORDER — SODIUM CHLORIDE 0.9% FLUSH
3.0000 mL | Freq: Two times a day (BID) | INTRAVENOUS | Status: DC
  Administered 2022-08-25 – 2022-08-26 (×5): 3 mL via INTRAVENOUS

## 2022-08-25 NOTE — Progress Notes (Signed)
[facility] for Heparin infusion> apixban Indication: atrial fibrillation  Allergies  Allergen Reactions   Contrast Media [Iodinated Contrast Media] Shortness Of Breath and Nausea And Vomiting   Lisinopril Cough    Patient Measurements: Height: '5\' 6"'$  (167.6 cm) Weight: 85.8 kg (189 lb 1.6 oz) IBW/kg (Calculated) : 63.8 Heparin Dosing Weight: 81.1 kg  Vital Signs: Temp: 97.6 F (36.4 C) (12/01 1111) Temp Source: Oral (12/01 1111) BP: 136/46 (12/01 1111) Pulse Rate: 48 (12/01 1111)  Labs: Recent Labs    08/24/22 1646 08/25/22 0009 08/25/22 0400 08/25/22 0541 08/25/22 0845  HGB 11.0*  --  8.5*  --  8.8*  HCT 34.6*  --  26.3*  --  27.7*  PLT 189  --  145*  --  135*  APTT  --  44*  --   --  154*  HEPARINUNFRC  --   --   --   --  0.69  CREATININE 2.30*  --  2.10*  --   --   TROPONINIHS  --   --  37* 35*  --      Estimated Creatinine Clearance: 32.7 mL/min (A) (by C-G formula based on SCr of 2.1 mg/dL (H)).   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 09/2016 s/p MDT Visia AF MRI single lead ICD (ser # [id number]).   Arthritis    "maybe in his spine" (09/05/2016)   Asthma    pt. denies   Cardiac arrest ([place])    a. 08/2016 VT/VF Arrest-->09/2016 s/p MDT Visia AF MRI single lead ICD (ser # [id number]).   Chronic HFrEF (heart failure with reduced ejection fraction) ([place])    a. 08/2016 Echo: EF 35-40%; b. 04/2018 Echo: EF 30-35%; c. 04/2020 Echo: EF 30-35%; d. 11/2021 Echo: EF 40-45%, GrI DD, nl RV fxn, sev dil LA, mild-mod MR, mild-mod AI, Ao root 18m.   Coronary artery disease    a. 08/2016 VT Arrest/Cath: LM nl, LAD nl, LCX nl, RGCA 90p, 100d w/ L->R collats, EF 35-45%.   Gout    Hyperlipidemia LDL goal <70    Hypertension    Ischemic cardiomyopathy    a. 08/2016 Echo: EF 35-40%; b. 04/2018 Echo: EF 30-35%; c. 04/2020 Echo: EF 30-35%; d. 11/2021 Echo:  EF 40-45%.   LBBB (left bundle branch block)    OSA on CPAP    does not wear CPAP   PAF (paroxysmal atrial fibrillation) ([facility])    a. CHA2DS2VASc = 5-->Eliquis.   Type II diabetes mellitus ([facility])     Medications:  Medications Prior to Admission  Medication Sig Dispense Refill Last Dose   allopurinol (ZYLOPRIM) 300 MG tablet Take 300 mg by mouth daily.      amiodarone (PACERONE) 200 MG tablet Take 0.5 tablets (100 mg total) by mouth daily. 45 tablet 3    apixaban (ELIQUIS) 5 MG TABS tablet Take 1 tablet (5 mg total) by mouth 2 (two) times daily. 180 tablet 2    atorvastatin (LIPITOR) 40 MG tablet Take 1 tablet (40 mg total) by mouth daily at 6 PM. (Patient taking differently: Take 40 mg by mouth daily.) 30 tablet 5    carvedilol (COREG) 12.5 MG tablet TAKE 1 TABLET (12.5 MG TOTAL) BY MOUTH 2 (TWO) TIMES DAILY WITH A MEAL. 180 tablet 1    [employer]S VITAMIN D3 1000 units capsule Take 1,000 Units by mouth daily.  5    ENTRESTO 24-26 MG  TAKE 1 TABLET BY MOUTH TWICE A DAY (Patient taking differently: Take 1 tablet by mouth 2 (two) times daily.) 180 tablet 3    furosemide (LASIX) 40 MG tablet Take 1 tablet (40 mg total) by mouth 2 (two) times daily. 180 tablet 1    furosemide (LASIX) 80 MG tablet TAKE 1 TABLET BY MOUTH EVERY DAY (Patient taking differently: Take 40 mg by mouth daily.) 90 tablet 3    magnesium oxide (MAG-OX) 400 MG tablet Take 1 tablet (400 mg total) by mouth in the morning AND 0.5 tablets (200 mg total) every evening. 1/2 tablet of '400mg'$ . 180 tablet 3    spironolactone (ALDACTONE) 25 MG tablet TAKE 1 TABLET BY MOUTH EVERY DAY (Patient taking differently: Take 25 mg by mouth daily.) 30 tablet 0     Assessment: 71 yo M admitted after defibrillator shock, on Eliquis PTA for afib on Eliquis PTA. He is on heparin and plans to change back to apixaban -hg= 8.8 (close to baseline, plt= 135 (history of thrombocytopenia)   Goal of Therapy:  Heparin level 0.3-0.7 units/ml aPTT 66-102  seconds Monitor platelets by anticoagulation protocol: Yes   Plan:  -Stop heparin at 6pm and restart apixaban '5mg'$  po bid  Hildred Laser, PharmD Clinical Pharmacist **Pharmacist phone directory can now be found on [email or web address] ([email or web address]).  Listed under [address].

## 2022-08-25 NOTE — Consult Note (Addendum)
ELECTROPHYSIOLOGY CONSULT NOTE    Patient ID: Dean Bones Sr. MRN: [id number], DOB/AGE: Mar 17, 1951 71 y.o.  Admit date: 08/24/2022 Date of Consult: 08/25/2022  Primary Physician: [name], MD Primary Cardiologist: Pixie Casino, MD  Electrophysiologist: Dr. Curt Bears   Referring Provider: Dr. Margaretann Loveless  Patient Profile: Dean Domeier Sr. is a 71 y.o. 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 Margaretann Loveless.  HPI:  Dean Olden Sr. is a 71 y.o. male with medical history as above.   Pt was notified by device clinic 11/15 he had increased optivol and for 3 days last week he took an additional 40 mg of lasix (80 q am and 40 q pm), though he stays weight has been stable. He reported otherwise being in his [place] until 11/30. He was building a deck for his church with his son when he felt a sudden thump at the back of his neck. He assumed that his ICD fired despite not feeling anything in his chest.   Denies presyncope, syncope, CP, or dyspnea. He presented to ED for further evaluation.   ICD interrogation on arrival showed VT/VF with max rate of 231 bpm, avg of 222 bpm. He had 2 aborted therapies and 1 successful therapy at 36.3J. AKI noted, but baseline distant.   Potassium3.9 (12/01 0400) Magnesium  3.1* (12/01 0549) Creatinine, ser  2.10* (12/01 0400) PLT  145* (12/01 0400) HGB  8.5* (12/01 0400) WBC 8.4 (12/01 0400) Troponin I (High Sensitivity)35* (12/01 0541).    Pt is feeling OK this am. Relatively asymptomatic.  He does state that he had ran out of his amiodarone for approx a week or more, but it should be now waiting at the pharmacy. No syncope.    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 09/2016 s/p MDT Visia AF MRI single lead ICD (ser # [id number]).   Arthritis    "maybe in his spine" (09/05/2016)   Asthma    pt. denies   Cardiac arrest ([place])    a. 08/2016 VT/VF Arrest-->09/2016 s/p MDT Visia AF MRI single lead ICD (ser # [id number]).   Chronic HFrEF (heart failure with reduced ejection fraction) ([place])    a. 08/2016 Echo: EF 35-40%; b. 04/2018 Echo: EF 30-35%; c. 04/2020 Echo: EF 30-35%; d. 11/2021 Echo: EF 40-45%, GrI DD, nl RV fxn, sev dil LA, mild-mod MR, mild-mod AI, Ao root 43m.   Coronary artery disease    a. 08/2016 VT Arrest/Cath: LM nl, LAD nl, LCX nl, RGCA 90p, 100d w/ L->R collats, EF 35-45%.   Gout    Hyperlipidemia LDL goal <70    Hypertension    Ischemic cardiomyopathy    a. 08/2016 Echo: EF 35-40%; b. 04/2018 Echo: EF 30-35%; c. 04/2020 Echo: EF 30-35%; d. 11/2021 Echo: EF 40-45%.   LBBB (left bundle branch block)    OSA on CPAP    does not wear CPAP   PAF (paroxysmal atrial fibrillation) (HCC)    a. CHA2DS2VASc = 5-->Eliquis.   Type II diabetes mellitus ([facility]      Surgical History:  Past Surgical History:  Procedure Laterality Date   CARDIAC CATHETERIZATION N/A 09/07/2016   Procedure: Left Heart Cath and  Coronary Angiography;  Surgeon: Peter M Martinique, MD;  Location: [place] [facility];  Service: Cardiovascular;  Laterality: N/A;   COLONOSCOPY WITH PROPOFOL N/A 08/30/2018   Procedure: COLONOSCOPY WITH PROPOFOL;  Surgeon: Carol Ada, MD;  Location: [facility];  Service: Endoscopy;  Laterality: N/A;   EP IMPLANTABLE DEVICE N/A 10/10/2016   Procedure: ICD Implant;  Surgeon: Nakshatra Klose Meredith Leeds, MD;  Location: [place] [facility];  Service: Cardiovascular;  Laterality: N/A;   LUMBAR LAMINECTOMY/DECOMPRESSION MICRODISCECTOMY N/A 01/18/2018   Procedure: L2-3, L3-4, L-4-5 CENTRAL DECOMPRESSION;  Surgeon: [name], MD;  Location: [facility];   Service: Orthopedics;  Laterality: N/A;   LUMBAR LAMINECTOMY/DECOMPRESSION MICRODISCECTOMY N/A 08/12/2020   Procedure: THORACIC ELEVEN LAMINECTOMY;  Surgeon: Earnie Larsson, MD;  Location: [place];  Service: Neurosurgery;  Laterality: N/A;   POLYPECTOMY  08/30/2018   Procedure: POLYPECTOMY;  Surgeon: Carol Ada, MD;  Location: [facility];  Service: Endoscopy;;     (Not in a hospital admission)   Inpatient Medications:   allopurinol  300 mg Oral Daily   aspirin EC  81 mg Oral Daily   atorvastatin  40 mg Oral q1800   carvedilol  12.5 mg Oral BID WC   magnesium oxide  400 mg Oral BID   sacubitril-valsartan  1 tablet Oral BID   sodium chloride flush  3 mL Intravenous Q12H   spironolactone  25 mg Oral Daily    Allergies:  Allergies  Allergen Reactions   Contrast Media [Iodinated Contrast Media] Shortness Of Breath and Nausea And Vomiting   Lisinopril Cough    Social History   Socioeconomic History   Marital status: Widowed    Spouse name: Not on file   Number of children: Not on file   Years of education: Not on file   Highest education level: Not on file  Occupational History   Occupation: semi-retired Museum/gallery curator  Tobacco Use   Smoking status: Never   Smokeless tobacco: Never  Vaping Use   Vaping Use: Never used  Substance and Sexual Activity   Alcohol use: No   Drug use: No   Sexual activity: Not on file  Other Topics Concern   Not on file  Social History Narrative   Not on file   Social Determinants of Health   Financial Resource Strain: Not on file  Food Insecurity: Not on file  Transportation Needs: Not on file  Physical Activity: Not on file  Stress: Not on file  Social Connections: Not on file  Intimate Partner Violence: Not on file     Family History  Problem Relation Age of Onset   Heart attack Father        died in his 87's   Hypertension Sister    Cancer Brother        uncertain, "leg"   Hypertension Sister      Review of Systems: All other  systems reviewed and are otherwise negative except as noted above.  Physical Exam: Vitals:   08/25/22 0645 08/25/22 0700 08/25/22 0715 08/25/22 0730  BP: (!) 125/41 (!) 125/41 (!) 128/44 (!) 129/99  Pulse:      Resp: 13 17 (!) 9 13  Temp:      TempSrc:      SpO2:      Weight:      Height:        GEN- The patient is well appearing, alert and oriented x 3 today.   HEENT: normocephalic, atraumatic; sclera clear, conjunctiva pink; hearing intact; oropharynx clear;  neck supple Lungs- Clear to ausculation bilaterally, normal work of breathing.  No wheezes, rales, rhonchi Heart- Regular rate and rhythm, no murmurs, rubs or gallops GI- soft, non-tender, non-distended, bowel sounds present Extremities- no clubbing, cyanosis, or edema; DP/PT/radial pulses 2+ bilaterally MS- no significant deformity or atrophy Skin- warm and dry, no rash or lesion Psych- euthymic mood, full affect Neuro- strength and sensation are intact  Labs:   Lab Results  Component Value Date   WBC 8.4 08/25/2022   HGB 8.5 (L) 08/25/2022   HCT 26.3 (L) 08/25/2022   MCV 91.3 08/25/2022   PLT 145 (L) 08/25/2022    Recent Labs  Lab 08/25/22 0400  NA 137  K 3.9  CL 105  CO2 21*  BUN 50*  CREATININE 2.10*  CALCIUM 9.7  PROT 5.7*  BILITOT <0.1*  ALKPHOS 63  ALT 10  AST 15  GLUCOSE 119*      Radiology/Studies: DG Chest 2 View  Result Date: 08/24/2022 CLINICAL DATA:  Defibrillator fired EXAM: CHEST - 2 VIEW COMPARISON:  08/20/2020 FINDINGS: Frontal and lateral views of the chest demonstrate an unremarkable cardiac silhouette. Single lead AICD unchanged. No acute airspace disease, effusion, or pneumothorax. No acute bony abnormalities. IMPRESSION: 1. No acute intrathoracic process. Electronically Signed   By: Randa Ngo M.D.   On: 08/24/2022 17:15    EKG: Sinus brad/NSR 40-70s bpm, occasional PVCs (personally reviewed)  TELEMETRY: NSR/ST (personally reviewed)  DEVICE HISTORY: Medtronic single  chamber ICD implanted 10/10/2016, secondary prevention  Assessment/Plan: 1.  VT/VF Likely in setting of ? Over diuresis and amiodarone non-compliance Re-load amio via IV. Continue through today.  Potassium3.9 (12/01 0400) Magnesium  3.1* (12/01 0549) Creatinine, ser  2.10* (12/01 0400) Keep K > 4.0 and Mg > 2.0  HS trop flat.   2. Acute on chronic systolic CHF His optivol reads as elevated but his physical exam is not consistent and he has had AKI. Jelesa Mangini follow closely but hydrate gently today.  EF 40-45% 11/2021. Update pending Continue BB, Entresto, and spiro.  Consider SGLT2i.   3. AKI Follow with gentle hydration today. His weights have been stable and he has not been SOB.   4. PAF Maintaining NSR.  Amiodarone as above.  Do not suspect he Delonna Ney need cath. Olla Delancey await Echo and then likely resume Eliquis.   5. CAD No s/s ischemia HS trop flat.    For questions or updates, please contact [place] Please consult [email or web address] for contact info under Cardiology/STEMI.  Signed, Shirley Friar, PA-C  08/25/2022 8:47 AM   I have seen and examined this patient with Oda Kilts.  Agree with above, note added to reflect my findings.  Patient has history of chronic systolic heart failure, VT/VF arrest December 2017 post ICD, coronary artery disease, atrial fibrillation.  He presented to the emergency room after an ICD shock.  ICD interrogation shows VT/VF at rates of up to 31 bpm.  He had to abort his therapies and 1 successful shock with resumption of sinus rhythm.  He is feeling well today.  Asymptomatic.  He ran out of his amiodarone approximately 1 week ago.  GEN: Well nourished, well developed, in no acute distress  HEENT: normal  Neck: no JVD, carotid bruits, or masses Cardiac: RRR; no murmurs, rubs, or gallops,no edema  Respiratory:  clear to auscultation bilaterally, normal work of breathing GI: soft, nontender, nondistended, + BS MS: no deformity or atrophy   Skin: warm and dry, device site well healed Neuro:  Strength and sensation are intact Psych: euthymic mood, full affect   Ventricular tachycardia: Fortunately no further episodes.  He is now on IV amiodarone.  He was out of his amiodarone previously.  Labs are within normal limits.  We Sophiah Rolin continue IV amiodarone for the next 24 hours and transition to p.o.  Likely discharge over the weekend. Acute on chronic systolic heart failure: Ejection fraction 40 to 45%.  Repeat echo pending. Acute renal failure: No obvious volume overload.  Had a recent increase in diuretics.  We Lavi Sheehan plan gentle hydration today. Paroxysmal atrial fibrillation: Maintaining sinus rhythm.  Continue amiodarone.  For now no indication of catheterization.  We Avory Mimbs likely resume Eliquis post echo. Coronary artery disease: No signs of ischemia.  Troponin not elevated.  No indication for catheterization.  Tomaz Janis M. Lauretta Sallas MD 08/25/2022 10:04 AM

## 2022-08-25 NOTE — ED Notes (Signed)
Transport delay d/t echo in progress

## 2022-08-25 NOTE — Progress Notes (Signed)
Patient transferred from ED at 1110hrs.  Oriented to room and plan of care for shift.  Patient verbalized understanding. HR 48-55, SB.  A. Tillery PA aware.  Amiodarone gtt IV infusing per order.  Patient states he had wallet in ED.  Unable to find wallet in possessions.  Charge nurse to call security. Patient's daughter at bedside.

## 2022-08-25 NOTE — Progress Notes (Signed)
CSW received consult for utility resources. CSW spoke with patient at bedside. CSW offered patient resources for utility assistance through [employer]. Patient accepted. All questions answered. No further questions reported at this time.

## 2022-08-25 NOTE — Progress Notes (Signed)
[facility] for Heparin infusion Indication: atrial fibrillation  Allergies  Allergen Reactions   Contrast Media [Iodinated Contrast Media] Shortness Of Breath and Nausea And Vomiting   Lisinopril Cough    Patient Measurements: Height: '5\' 6"'$  (167.6 cm) Weight: 84.4 kg (186 lb) (pt reported) IBW/kg (Calculated) : 63.8 Heparin Dosing Weight: 81.1 kg  Vital Signs: Temp: 97.9 F (36.6 C) (12/01 1012) Temp Source: Oral (12/01 1012) BP: 132/39 (12/01 1000) Pulse Rate: 51 (12/01 1000)  Labs: Recent Labs    08/24/22 1646 08/25/22 0009 08/25/22 0400 08/25/22 0541 08/25/22 0845  HGB 11.0*  --  8.5*  --  8.8*  HCT 34.6*  --  26.3*  --  27.7*  PLT 189  --  145*  --  135*  APTT  --  44*  --   --  154*  HEPARINUNFRC  --   --   --   --  0.69  CREATININE 2.30*  --  2.10*  --   --   TROPONINIHS  --   --  37* 35*  --      Estimated Creatinine Clearance: 32.4 mL/min (A) (by C-G formula based on SCr of 2.1 mg/dL (H)).   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 09/2016 s/p MDT Visia AF MRI single lead ICD (ser # [id number]).   Arthritis    "maybe in his spine" (09/05/2016)   Asthma    pt. denies   Cardiac arrest ([place])    a. 08/2016 VT/VF Arrest-->09/2016 s/p MDT Visia AF MRI single lead ICD (ser # [id number]).   Chronic HFrEF (heart failure with reduced ejection fraction) ([place])    a. 08/2016 Echo: EF 35-40%; b. 04/2018 Echo: EF 30-35%; c. 04/2020 Echo: EF 30-35%; d. 11/2021 Echo: EF 40-45%, GrI DD, nl RV fxn, sev dil LA, mild-mod MR, mild-mod AI, Ao root 26m.   Coronary artery disease    a. 08/2016 VT Arrest/Cath: LM nl, LAD nl, LCX nl, RGCA 90p, 100d w/ L->R collats, EF 35-45%.   Gout    Hyperlipidemia LDL goal <70    Hypertension    Ischemic cardiomyopathy    a. 08/2016 Echo: EF 35-40%; b. 04/2018 Echo: EF 30-35%; c. 04/2020 Echo: EF 30-35%; d. 11/2021 Echo: EF  40-45%.   LBBB (left bundle branch block)    OSA on CPAP    does not wear CPAP   PAF (paroxysmal atrial fibrillation) (HCC)    a. CHA2DS2VASc = 5-->Eliquis.   Type II diabetes mellitus (HCC)     Medications:  (Not in a hospital admission)  Assessment: 71yo M admitted after defibrillator shock, on Eliquis PTA for afib on Eliquis PTA, to cover with heparin pending procedure  aPTT supratherapeutic on 1200 units/hr Plan likely to restart Eliquis pending echo per EP  Goal of Therapy:  Heparin level 0.3-0.7 units/ml aPTT 66-102 seconds Monitor platelets by anticoagulation protocol: Yes   Plan:  Hold gtt x 1h, then decrease heparin gtt to 1000 units/hr F/u 8 hour aPTT/HL vs transition back to Eliquis pending Echo results  JBertis Ruddy PharmD, BLenaPharmacist ED Pharmacist Phone # [phone]/09/2021 10:49 AM

## 2022-08-25 NOTE — ED Notes (Signed)
Pt alert, NAD, calm,, interactive, resps e/u, speaking in clear complete sentences, VSS, denies pain, sob, nausea, dizziness, weakness.

## 2022-08-25 NOTE — Progress Notes (Signed)
  Echocardiogram 2D Echocardiogram has been performed.  Dean [name] 08/25/2022, 10:53 AM

## 2022-08-26 LAB — CBC
HCT: 28.5 % — ABNORMAL LOW (ref 39.0–52.0)
Hemoglobin: 9.2 g/dL — ABNORMAL LOW (ref 13.0–17.0)
MCH: 29.3 pg (ref 26.0–34.0)
MCHC: 32.3 g/dL (ref 30.0–36.0)
MCV: 90.8 fL (ref 80.0–100.0)
Platelets: 140 10*3/uL — ABNORMAL LOW (ref 150–400)
RBC: 3.14 MIL/uL — ABNORMAL LOW (ref 4.22–5.81)
RDW: 12.5 % (ref 11.5–15.5)
WBC: 8.1 10*3/uL (ref 4.0–10.5)
nRBC: 0 % (ref 0.0–0.2)

## 2022-08-26 LAB — BASIC METABOLIC PANEL
Anion gap: 8 (ref 5–15)
BUN: 49 mg/dL — ABNORMAL HIGH (ref 8–23)
CO2: 19 mmol/L — ABNORMAL LOW (ref 22–32)
Calcium: 9.2 mg/dL (ref 8.9–10.3)
Chloride: 107 mmol/L (ref 98–111)
Creatinine, Ser: 2.23 mg/dL — ABNORMAL HIGH (ref 0.61–1.24)
GFR, Estimated: 31 mL/min — ABNORMAL LOW (ref >60)
Glucose, Bld: 124 mg/dL — ABNORMAL HIGH (ref 70–99)
Potassium: 4.7 mmol/L (ref 3.5–5.1)
Sodium: 134 mmol/L — ABNORMAL LOW (ref 135–145)

## 2022-08-26 LAB — GLUCOSE, CAPILLARY
Glucose-Capillary: 80 mg/dL (ref 70–99)
Glucose-Capillary: 94 mg/dL (ref 70–99)
Glucose-Capillary: 90 mg/dL (ref 70–99)
Glucose-Capillary: 110 mg/dL — ABNORMAL HIGH (ref 70–99)

## 2022-08-26 LAB — HEMOGLOBIN A1C
Hgb A1c MFr Bld: 5.3 % (ref 4.8–5.6)
Mean Plasma Glucose: 105 mg/dL

## 2022-08-26 MED ORDER — AMIODARONE HCL 200 MG PO TABS
400.0000 mg | ORAL_TABLET | Freq: Two times a day (BID) | ORAL | Status: DC
  Administered 2022-08-26 – 2022-08-27 (×3): 400 mg via ORAL
  Filled 2022-08-26 (×3): qty 2

## 2022-08-26 NOTE — Progress Notes (Signed)
Rounding Note    Patient Name: Dean Winrow Sr. Date of Encounter: 08/26/2022  [facility] Cardiologist: Pixie Casino, MD   Subjective   No chest pain or sob. Still being given IV amiodarone.   Inpatient Medications    Scheduled Meds:  allopurinol  300 mg Oral Daily   apixaban  5 mg Oral BID   aspirin EC  81 mg Oral Daily   atorvastatin  40 mg Oral q1800   carvedilol  12.5 mg Oral BID WC   magnesium oxide  400 mg Oral BID   sodium chloride flush  3 mL Intravenous Q12H   spironolactone  25 mg Oral Daily   Continuous Infusions:  sodium chloride     amiodarone 30 mg/hr (08/26/22 0600)   PRN Meds: sodium chloride, acetaminophen, nitroGLYCERIN, ondansetron (ZOFRAN) IV, mouth rinse, sodium chloride flush   Vital Signs    Vitals:   08/25/22 2058 08/25/22 2359 08/26/22 0555 08/26/22 0757  BP: (!) 124/44 (!) 131/44 (!) 131/59 (!) 123/48  Pulse: (!) 51 (!) 51 (!) 49 64  Resp: '16 18 16 17  '$ Temp: 97.9 F (36.6 C) 98 F (36.7 C) 97.8 F (36.6 C) 98 F (36.7 C)  TempSrc: Oral Oral Oral Oral  SpO2: 99% 100% 100% 98%  Weight:   86.5 kg   Height:        Intake/Output Summary (Last 24 hours) at 08/26/2022 0951 Last data filed at 08/26/2022 0600 Gross per 24 hour  Intake 2141.56 ml  Output 710 ml  Net 1431.56 ml      08/26/2022    5:55 AM 08/25/2022   11:11 AM 08/24/2022   10:30 PM  Last 3 Weights  Weight (lbs) 190 lb 11.2 oz 189 lb 1.6 oz 186 lb  Weight (kg) 86.501 kg 85.775 kg 84.369 kg      Telemetry    nsr - Personally Reviewed  ECG    none - Personally Reviewed  Physical Exam   GEN: No acute distress.   Neck: No JVD Cardiac: RRR, no murmurs, rubs, or gallops.  Respiratory: Clear to auscultation bilaterally. GI: Soft, nontender, non-distended  MS: No edema; No deformity. Neuro:  Nonfocal  Psych: Normal affect   Labs    High Sensitivity Troponin:   Recent Labs  Lab 08/25/22 0400 08/25/22 0541  TROPONINIHS 37* 35*      Chemistry Recent Labs  Lab 08/24/22 1646 08/25/22 0400 08/25/22 0549 08/26/22 0132  NA 135 137  --  134*  K 4.2 3.9  --  4.7  CL 105 105  --  107  CO2 21* 21*  --  19*  GLUCOSE 107* 119*  --  124*  BUN 58* 50*  --  49*  CREATININE 2.30* 2.10*  --  2.23*  CALCIUM 10.3 9.7  --  9.2  MG 1.8  --  3.1*  --   PROT  --  5.7*  --   --   ALBUMIN  --  3.2*  --   --   AST  --  15  --   --   ALT  --  10  --   --   ALKPHOS  --  63  --   --   BILITOT  --  <0.1*  --   --   GFRNONAA 29* 33*  --  31*  ANIONGAP 9 11  --  8    Lipids  Recent Labs  Lab 08/25/22 0400  CHOL 103  TRIG 34  HDL 33*  LDLCALC 63  CHOLHDL 3.1    Hematology Recent Labs  Lab 08/25/22 0400 08/25/22 0845 08/26/22 0132  WBC 8.4 8.5 8.1  RBC 2.88* 3.00* 3.14*  HGB 8.5* 8.8* 9.2*  HCT 26.3* 27.7* 28.5*  MCV 91.3 92.3 90.8  MCH 29.5 29.3 29.3  MCHC 32.3 31.8 32.3  RDW 12.6 12.5 12.5  PLT 145* 135* 140*   Thyroid  Recent Labs  Lab 08/25/22 0400  TSH 1.977    BNPNo results for input(s): "BNP", "PROBNP" in the last 168 hours.  DDimer No results for input(s): "DDIMER" in the last 168 hours.   Radiology    ECHOCARDIOGRAM COMPLETE  Result Date: 08/25/2022    ECHOCARDIOGRAM REPORT   Patient Name:   Dean Schappell Sr. Date of Exam: 08/25/2022 Medical Rec #:  [id number]            Height:       66.0 in Accession #:    [id number]           Weight:       186.0 lb Date of Birth:  Jun 23, 1951             BSA:          1.939 m Patient Age:    71 years             BP:           129/99 mmHg Patient Gender: M                    HR:           50 bpm. Exam Location:  Inpatient Procedure: 2D Echo, 3D Echo, Cardiac Doppler, Color Doppler and Intracardiac            Opacification Agent Indications:    R94.31 Abnormal EKG; I47.2 Ventricular tachycardia  History:        Patient has prior history of Echocardiogram examinations, most                 recent 12/07/2021. CHF, Abnormal ECG and Defibrillator,                  Arrythmias:Cardiac Arrest and VT; Risk Factors:Hypertension.  Sonographer:    Roseanna Rainbow RDCS Referring Phys: Murray Hodgkins, RONALD  Sonographer Comments: Patient is obese. Image acquisition challenging due to patient body habitus. IMPRESSIONS  1. Akinesis of the inferolateral wall with overall moderate LV dysfunction.  2. Left ventricular ejection fraction, by estimation, is 35 to 40%. The left ventricle has moderately decreased function. The left ventricle demonstrates regional wall motion abnormalities (see scoring diagram/findings for description). The left ventricular internal cavity size was moderately dilated. Left ventricular diastolic parameters are consistent with Grade I diastolic dysfunction (impaired relaxation). Elevated left atrial pressure.  3. Right ventricular systolic function is normal. The right ventricular size is normal. There is mildly elevated pulmonary artery systolic pressure.  4. Left atrial size was moderately dilated.  5. The mitral valve is normal in structure. Moderate mitral valve regurgitation. No evidence of mitral stenosis.  6. The aortic valve has an indeterminant number of cusps. Aortic valve regurgitation is moderate. No aortic stenosis is present.  7. Aortic dilatation noted. There is borderline dilatation of the aortic root, measuring 39 mm.  8. The inferior vena cava is normal in size with greater than 50% respiratory variability, suggesting right atrial pressure of 3 mmHg. FINDINGS  Left Ventricle: Left ventricular ejection fraction, by estimation, is  35 to 40%. The left ventricle has moderately decreased function. The left ventricle demonstrates regional wall motion abnormalities. The left ventricular internal cavity size was moderately dilated. There is no left ventricular hypertrophy. Left ventricular diastolic parameters are consistent with Grade I diastolic dysfunction (impaired relaxation). Elevated left atrial pressure. Right Ventricle: The right ventricular size  is normal. Right ventricular systolic function is normal. There is mildly elevated pulmonary artery systolic pressure. The tricuspid regurgitant velocity is 2.73 m/s, and with an assumed right atrial pressure of 8 mmHg, the estimated right ventricular systolic pressure is 82.5 mmHg. Left Atrium: Left atrial size was moderately dilated. Right Atrium: Right atrial size was normal in size. Pericardium: There is no evidence of pericardial effusion. Mitral Valve: The mitral valve is normal in structure. Moderate mitral valve regurgitation. No evidence of mitral valve stenosis. Tricuspid Valve: The tricuspid valve is normal in structure. Tricuspid valve regurgitation is mild . No evidence of tricuspid stenosis. Aortic Valve: The aortic valve has an indeterminant number of cusps. Aortic valve regurgitation is moderate. Aortic regurgitation PHT measures 639 msec. No aortic stenosis is present. Aortic valve mean gradient measures 5.0 mmHg. Aortic valve peak gradient measures 9.1 mmHg. Aortic valve area, by VTI measures 2.35 cm. Pulmonic Valve: The pulmonic valve was not well visualized. Pulmonic valve regurgitation is not visualized. No evidence of pulmonic stenosis. Aorta: Aortic dilatation noted. There is borderline dilatation of the aortic root, measuring 39 mm. Venous: The inferior vena cava is normal in size with greater than 50% respiratory variability, suggesting right atrial pressure of 3 mmHg. IAS/Shunts: No atrial level shunt detected by color flow Doppler. Additional Comments: Akinesis of the inferolateral wall with overall moderate LV dysfunction. A device lead is visualized.  LEFT VENTRICLE PLAX 2D LVIDd:         6.00 cm      Diastology LVIDs:         5.20 cm      LV e' medial:    3.89 cm/s LV PW:         1.10 cm      LV E/e' medial:  26.0 LV IVS:        1.20 cm      LV e' lateral:   8.19 cm/s LVOT diam:     2.30 cm      LV E/e' lateral: 12.3 LV SV:         93 LV SV Index:   48 LVOT Area:     4.15 cm                               3D Volume EF: LV Volumes (MOD)            3D EF:        32 % LV vol d, MOD A2C: 224.0 ml LV EDV:       275 ml LV vol d, MOD A4C: 212.0 ml LV ESV:       187 ml LV vol s, MOD A2C: 140.0 ml LV SV:        87 ml LV vol s, MOD A4C: 146.0 ml LV SV MOD A2C:     84.0 ml LV SV MOD A4C:     212.0 ml LV SV MOD BP:      83.4 ml RIGHT VENTRICLE             IVC RV S prime:     14.40  cm/s  IVC diam: 1.50 cm TAPSE (M-mode): 2.1 cm LEFT ATRIUM              Index        RIGHT ATRIUM           Index LA diam:        5.00 cm  2.58 cm/m   RA Area:     11.00 cm LA Vol (A2C):   109.0 ml 56.21 ml/m  RA Volume:   20.00 ml  10.31 ml/m LA Vol (A4C):   65.0 ml  33.52 ml/m LA Biplane Vol: 90.7 ml  46.78 ml/m  AORTIC VALVE AV Area (Vmax):    2.18 cm AV Area (Vmean):   2.08 cm AV Area (VTI):     2.35 cm AV Vmax:           151.00 cm/s AV Vmean:          108.000 cm/s AV VTI:            0.395 m AV Peak Grad:      9.1 mmHg AV Mean Grad:      5.0 mmHg LVOT Vmax:         79.10 cm/s LVOT Vmean:        54.000 cm/s LVOT VTI:          0.223 m LVOT/AV VTI ratio: 0.56 AI PHT:            639 msec  AORTA Ao Root diam: 3.90 cm Ao Asc diam:  3.10 cm MITRAL VALVE                  TRICUSPID VALVE MV Area (PHT): 3.08 cm       TR Peak grad:   29.8 mmHg MV Decel Time: 246 msec       TR Vmax:        273.00 cm/s MR Peak grad:    89.9 mmHg MR Mean grad:    54.0 mmHg    SHUNTS MR Vmax:         474.00 cm/s  Systemic VTI:  0.22 m MR Vmean:        340.0 cm/s   Systemic Diam: 2.30 cm MR PISA:         1.57 cm MR PISA Eff ROA: 13 mm MR PISA Radius:  0.50 cm MV E velocity: 101.00 cm/s MV A velocity: 108.00 cm/s MV E/A ratio:  0.94 Kirk Ruths MD Electronically signed by Kirk Ruths MD Signature Date/Time: 08/25/2022/12:17:03 PM    Final    DG Chest 2 View  Result Date: 08/24/2022 CLINICAL DATA:  Defibrillator fired EXAM: CHEST - 2 VIEW COMPARISON:  08/20/2020 FINDINGS: Frontal and lateral views of the chest demonstrate an unremarkable  cardiac silhouette. Single lead AICD unchanged. No acute airspace disease, effusion, or pneumothorax. No acute bony abnormalities. IMPRESSION: 1. No acute intrathoracic process. Electronically Signed   By: Randa Ngo M.D.   On: 08/24/2022 17:15    Cardiac Studies   2D echo noted  Patient Profile     71 y.o. male admitted with VT storm.  Assessment & Plan    VT - he will be switched to oral amio today and if no arrhythmias plan to DC home tomorrow. Chronic systolic heart failure - He appears to me to be euvolemic.  ICD - his Medtronic ICD appears to be working normally.  For questions or updates, please contact [place] Please consult [email or web address] for contact info under  Signed, Cristopher Peru, MD  08/26/2022, 9:51 AM

## 2022-08-26 NOTE — Plan of Care (Signed)
  Problem: Education: Goal: Knowledge of General Education information will improve Description: Including pain rating scale, medication(s)/side effects and non-pharmacologic comfort measures Outcome: Progressing   Problem: Health Behavior/Discharge Planning: Goal: Ability to manage health-related needs will improve Outcome: Progressing   Problem: Clinical Measurements: Goal: Cardiovascular complication will be avoided Outcome: Progressing   Problem: Safety: Goal: Ability to remain free from injury will improve Outcome: Progressing   Problem: Pain Managment: Goal: General experience of comfort will improve Outcome: Progressing

## 2022-08-26 NOTE — Plan of Care (Signed)
  Problem: Education: Goal: Knowledge of General Education information will improve Description: Including pain rating scale, medication(s)/side effects and non-pharmacologic comfort measures Outcome: Progressing   Problem: Health Behavior/Discharge Planning: Goal: Ability to manage health-related needs will improve Outcome: Progressing   Problem: Clinical Measurements: Goal: Respiratory complications will improve Outcome: Progressing   Problem: Clinical Measurements: Goal: Cardiovascular complication will be avoided Outcome: Progressing   Problem: Activity: Goal: Risk for activity intolerance will decrease Outcome: Progressing   Problem: Pain Managment: Goal: General experience of comfort will improve Outcome: Progressing   Problem: Safety: Goal: Ability to remain free from injury will improve Outcome: Progressing   Problem: Skin Integrity: Goal: Risk for impaired skin integrity will decrease Outcome: Progressing   

## 2022-08-27 ENCOUNTER — Other Ambulatory Visit: Payer: Self-pay | Admitting: Home Health

## 2022-08-27 ENCOUNTER — Other Ambulatory Visit: Payer: Self-pay | Admitting: Cardiology

## 2022-08-27 ENCOUNTER — Telehealth: Payer: Self-pay | Admitting: Home Health

## 2022-08-27 DIAGNOSIS — Z79899 Other long term (current) drug therapy: Secondary | ICD-10-CM

## 2022-08-27 LAB — GLUCOSE, CAPILLARY: Glucose-Capillary: 93 mg/dL (ref 70–99)

## 2022-08-27 MED ORDER — AMIODARONE HCL 400 MG PO TABS: ORAL_TABLET | ORAL | 0 refills | Status: DC

## 2022-08-27 MED ORDER — MAGNESIUM OXIDE -MG SUPPLEMENT 400 (240 MG) MG PO TABS: 400.0000 mg | ORAL_TABLET | Freq: Every day | ORAL | 1 refills | Status: DC

## 2022-08-27 NOTE — Discharge Instructions (Signed)

## 2022-08-27 NOTE — Telephone Encounter (Signed)
Patient called back to hospital asking amiodarone prescription was sent at 400 mg strength which costing him $80 co-pay and [facility] does not have the medicine. Called [facility] at [facility], discussed with pharmacist, they have 200 mg strength amiodarone, advised pharmacist to fill amiodarone 200 mg/ 2 tablets twice daily x 5 days starting 08/27/2022, then 200 mg /2 tablets daily after, 90-day supply which is 190 tablet provided, 1 refill provided. This has been communicated to the patient.  He is satisfied with the solution.

## 2022-08-27 NOTE — Progress Notes (Signed)
Pt stated that he found his wallet in his truck.  Idolina Primer, RN

## 2022-08-27 NOTE — Discharge Summary (Addendum)
Discharge Summary    Patient ID: Dean Kaufhold Sr. MRN: [id number]; DOB: Apr 11, 1951  Admit date: 08/24/2022 Discharge date: 08/27/2022  PCP:  Dean [name], [place] Prov[facility] Cardiologist:  Dean Casino, MD  Cardiology APP:  Dean Colonel, NP  Electrophysiologist:  Dean Haw, MD    Discharge Diagnoses    Principal Problem:   Ventricular tachycardia [facility]) Active Problems:   Essential hypertension, benign   Stage 3 chronic kidney disease ([place])   ICD (implantable cardioverter-defibrillator) in place   Hyperlipidemia LDL goal <70  Diagnostic Studies/Procedures    Echo: 08/25/22  IMPRESSIONS     1. Akinesis of the inferolateral wall with overall moderate LV  dysfunction.   2. Left ventricular ejection fraction, by estimation, is 35 to 40%. The  left ventricle has moderately decreased function. The left ventricle  demonstrates regional wall motion abnormalities (see scoring  diagram/findings for description). The left  ventricular internal cavity size was moderately dilated. Left ventricular  diastolic parameters are consistent with Grade I diastolic dysfunction  (impaired relaxation). Elevated left atrial pressure.   3. Right ventricular systolic function is normal. The right ventricular  size is normal. There is mildly elevated pulmonary artery systolic  pressure.   4. Left atrial size was moderately dilated.   5. The mitral valve is normal in structure. Moderate mitral valve  regurgitation. No evidence of mitral stenosis.   6. The aortic valve has an indeterminant number of cusps. Aortic valve  regurgitation is moderate. No aortic stenosis is present.   7. Aortic dilatation noted. There is borderline dilatation of the aortic  root, measuring 39 mm.   8. The inferior vena cava is normal in size with greater than 50%  respiratory variability, suggesting right atrial pressure of 3 mmHg.   FINDINGS   Left Ventricle: Left  ventricular ejection fraction, by estimation, is 35  to 40%. The left ventricle has moderately decreased function. The left  ventricle demonstrates regional wall motion abnormalities. The left  ventricular internal cavity size was  moderately dilated. There is no left ventricular hypertrophy. Left  ventricular diastolic parameters are consistent with Grade I diastolic  dysfunction (impaired relaxation). Elevated left atrial pressure.   Right Ventricle: The right ventricular size is normal. Right ventricular  systolic function is normal. There is mildly elevated pulmonary artery  systolic pressure. The tricuspid regurgitant velocity is 2.73 m/s, and  with an assumed right atrial pressure  of 8 mmHg, the estimated right ventricular systolic pressure is 78.2 mmHg.   Left Atrium: Left atrial size was moderately dilated.   Right Atrium: Right atrial size was normal in size.   Pericardium: There is no evidence of pericardial effusion.   Mitral Valve: The mitral valve is normal in structure. Moderate mitral  valve regurgitation. No evidence of mitral valve stenosis.   Tricuspid Valve: The tricuspid valve is normal in structure. Tricuspid  valve regurgitation is mild . No evidence of tricuspid stenosis.   Aortic Valve: The aortic valve has an indeterminant number of cusps.  Aortic valve regurgitation is moderate. Aortic regurgitation PHT measures  639 msec. No aortic stenosis is present. Aortic valve mean gradient  measures 5.0 mmHg. Aortic valve peak  gradient measures 9.1 mmHg. Aortic valve area, by VTI measures 2.35 cm.   Pulmonic Valve: The pulmonic valve was not well visualized. Pulmonic valve  regurgitation is not visualized. No evidence of pulmonic stenosis.   Aorta: Aortic dilatation noted. There is borderline dilatation of the  aortic root, measuring 39 mm.   Venous: The inferior vena cava is normal in size with greater than 50%  respiratory variability, suggesting right  atrial pressure of 3 mmHg.   IAS/Shunts: No atrial level shunt detected by color flow Doppler.   Additional Comments: Akinesis of the inferolateral wall with overall  moderate LV dysfunction. A device lead is visualized.   _____________   History of Present Illness     Dean [name]. is a 71 y.o. 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   In December 2017, patient suffered a VT/VF arrest requiring defibrillation and CPR. He had a prolonged hospitalization.  He underwent diagnostic catheterization which showed an occluded distal right coronary artery with otherwise normal coronary arteries.  He has left-to-right collaterals to the distal RCA.  EF was 35 to 40% by echo.  He was seen by electrophysiology, placed on amiodarone therapy, and subsequently underwent placement of a [employer] single-lead AICD.  He has since been managed in the outpatient setting with GDMT.  He has been followed closely by our device nurse with sliding scale Lasix adjustments based on OptiVol numbers.   Dean [name] lives locally with his daughters and grandchildren.  He is retired but remains active around his home without discharge.  He and his son are currently in the process of building a building for the church.  He does not experience chest pain, dyspnea, and has not any recent change in activity tolerance.  He was notified by our device nurse on November 15 has OptiVol numbers were trending higher, suggesting volume overload, and for 3 days last week, he took an additional 40 mg of Lasix (usually takes 80 mg once daily).  He reported his weight had been stable around 186 pounds.  Over the past month or so, he  noted that when he was lying in bed, he sometimes hears skipped heartbeat, though was otherwise asymptomatic. He was in his usual state of health earlier the day of admission, when he was building a deck at his church, with his son, without any prodromal symptoms or warning, he suddenly felt a thump to the back of his neck.  He assumed that his ICD fired, though he noted he did not feel anything in his chest.  He denied any presyncope/syncope, chest pain, or dyspnea surrounding the event.  Out of concern for an ICD discharge, he presented to the emergency department.  Here, ICD interrogation shows VT/VF with a maximum rate of 231 bpm, median rate of 222 bpm.  He had 2 aborted discharges followed by 1 successful discharge at 36.3 J.  Lab work notable for acute kidney injury with a creatinine of 2.3 (1.3 02 August 2021), and mild hypomagnesemia at 1.8.  Potassium is normal at 4.2.  Patient was asymptomatic and had been so ever since the ICD shock.  Upon discussing the patient's medications, he indicated that he has been out of his amiodarone for at least a week.  Hospital Course     VT/VF S/p AICD -- Likely in setting of amiodarone non-compliance. Placed on IV amiodarone with stable rhythm during admission. Transitioned to oral amiodarone '400mg'$  BID x5 days, then '400mg'$  daily to wean based on clinical progress -- maintain K>4, mag >2  Acute on chronic systolic  CHF -- on admission, his optivol read as elevated but his physical exam was not consistent and he has had AKI. He was gently hydrated on admission with stable Cr. -- EF 40-45% 11/2021, repeat this admission showed LVEF of 35-40%, normal RV size and function, G1DD, moderately dilated LA, moderate MR -- Continue BB, spiro. Home Entresto held during admission/at DC. Consider resuming at follow up appt -- resumed on home lasix dosing at DC with electrolyte supplements -- BMET 12/8   CKD stage III with AKI -- noted in the setting of VT, remained stable  during admission. -- BMET at follow up   PAF -- maintaining sinus rhythm, continue amiodarone as above -- continue Eliquis   CAD -- No s/s ischemia -- HS trop flat.   Did the patient have an acute coronary syndrome (MI, NSTEMI, STEMI, etc) this admission?:  No                               Did the patient have a percutaneous coronary intervention (stent / angioplasty)?:  No.    The patient will be scheduled for a TOC follow up appointment in 10-14 days.  A message has been sent to the [facility] and Scheduling Pool at the office where the patient should be seen for follow up.  _____________  Discharge Vitals Blood pressure (!) 134/47, pulse 61, temperature 97.8 F (36.6 C), temperature source Oral, resp. rate 19, height '5\' 6"'$  (1.676 m), weight 86.5 kg, SpO2 99 %.  Filed Weights   08/25/22 1111 08/26/22 0555 08/27/22 0500  Weight: 85.8 kg 86.5 kg 86.5 kg    Labs & Radiologic Studies    CBC Recent Labs    08/24/22 1646 08/25/22 0400 08/25/22 0845 08/26/22 0132  WBC 10.1   < > 8.5 8.1  NEUTROABS 7.4  --   --   --   HGB 11.0*   < > 8.8* 9.2*  HCT 34.6*   < > 27.7* 28.5*  MCV 92.3   < > 92.3 90.8  PLT 189   < > 135* 140*   < > = values in this interval not displayed.   Basic Metabolic Panel Recent Labs    08/24/22 1646 08/25/22 0400 08/25/22 0549 08/26/22 0132  NA 135 137  --  134*  K 4.2 3.9  --  4.7  CL 105 105  --  107  CO2 21* 21*  --  19*  GLUCOSE 107* 119*  --  124*  BUN 58* 50*  --  49*  CREATININE 2.30* 2.10*  --  2.23*  CALCIUM 10.3 9.7  --  9.2  MG 1.8  --  3.1*  --    Liver Function Tests Recent Labs    08/25/22 0400  AST 15  ALT 10  ALKPHOS 63  BILITOT <0.1*  PROT 5.7*  ALBUMIN 3.2*   No results for input(s): "LIPASE", "AMYLASE" in the last 72 hours. High Sensitivity Troponin:   Recent Labs  Lab 08/25/22 0400 08/25/22 0541  TROPONINIHS 37* 35*    BNP Invalid input(s): "POCBNP" D-Dimer No results for input(s): "DDIMER" in the last  72 hours. Hemoglobin A1C Recent Labs    08/25/22 0400  HGBA1C 5.3   Fasting Lipid Panel Recent Labs    08/25/22 0400  CHOL 103  HDL 33*  LDLCALC 63  TRIG 34  CHOLHDL 3.1   Thyroid Function Tests Recent Labs    08/25/22 0400  TSH 1.977   _____________  ECHOCARDIOGRAM COMPLETE  Result Date: 08/25/2022    ECHOCARDIOGRAM REPORT   Patient Name:   Dean Shearer Sr. Date of Exam: 08/25/2022 Medical Rec #:  [id number]            Height:       66.0 in Accession #:    [id number]           Weight:       186.0 lb Date of Birth:  1951-01-24             BSA:          1.939 m Patient Age:    71 years             BP:           129/99 mmHg Patient Gender: M                    HR:           50 bpm. Exam Location:  Inpatient Procedure: 2D Echo, 3D Echo, Cardiac Doppler, Color Doppler and Intracardiac            Opacification Agent Indications:    R94.31 Abnormal EKG; I47.2 Ventricular tachycardia  History:        Patient has prior history of Echocardiogram examinations, most                 recent 12/07/2021. CHF, Abnormal ECG and Defibrillator,                 Arrythmias:Cardiac Arrest and VT; Risk Factors:Hypertension.  Sonographer:    Roseanna Rainbow RDCS Referring Phys: Murray Hodgkins, RONALD  Sonographer Comments: Patient is obese. Image acquisition challenging due to patient body habitus. IMPRESSIONS  1. Akinesis of the inferolateral wall with overall moderate LV dysfunction.  2. Left ventricular ejection fraction, by estimation, is 35 to 40%. The left ventricle has moderately decreased function. The left ventricle demonstrates regional wall motion abnormalities (see scoring diagram/findings for description). The left ventricular internal cavity size was moderately dilated. Left ventricular diastolic parameters are consistent with Grade I diastolic dysfunction (impaired relaxation). Elevated left atrial pressure.  3. Right ventricular systolic function is normal. The right ventricular size is normal. There  is mildly elevated pulmonary artery systolic pressure.  4. Left atrial size was moderately dilated.  5. The mitral valve is normal in structure. Moderate mitral valve regurgitation. No evidence of mitral stenosis.  6. The aortic valve has an indeterminant number of cusps. Aortic valve regurgitation is moderate. No aortic stenosis is present.  7. Aortic dilatation noted. There is borderline dilatation of the aortic root, measuring 39 mm.  8. The inferior vena cava is normal in size with greater than 50% respiratory variability, suggesting right atrial pressure of 3 mmHg. FINDINGS  Left Ventricle: Left ventricular ejection fraction, by estimation, is 35 to 40%. The left ventricle has moderately decreased function. The left ventricle demonstrates regional wall motion abnormalities. The left ventricular internal cavity size was moderately dilated. There is no left ventricular hypertrophy. Left ventricular diastolic parameters are consistent with Grade I diastolic dysfunction (impaired relaxation). Elevated left atrial pressure. Right Ventricle: The right ventricular size is normal. Right ventricular systolic function is normal. There is mildly elevated pulmonary artery systolic pressure. The tricuspid regurgitant velocity is 2.73 m/s, and with an assumed right atrial pressure of 8 mmHg, the estimated right ventricular systolic pressure is 82.9 mmHg. Left Atrium: Left atrial size was moderately dilated.  Right Atrium: Right atrial size was normal in size. Pericardium: There is no evidence of pericardial effusion. Mitral Valve: The mitral valve is normal in structure. Moderate mitral valve regurgitation. No evidence of mitral valve stenosis. Tricuspid Valve: The tricuspid valve is normal in structure. Tricuspid valve regurgitation is mild . No evidence of tricuspid stenosis. Aortic Valve: The aortic valve has an indeterminant number of cusps. Aortic valve regurgitation is moderate. Aortic regurgitation PHT measures 639  msec. No aortic stenosis is present. Aortic valve mean gradient measures 5.0 mmHg. Aortic valve peak gradient measures 9.1 mmHg. Aortic valve area, by VTI measures 2.35 cm. Pulmonic Valve: The pulmonic valve was not well visualized. Pulmonic valve regurgitation is not visualized. No evidence of pulmonic stenosis. Aorta: Aortic dilatation noted. There is borderline dilatation of the aortic root, measuring 39 mm. Venous: The inferior vena cava is normal in size with greater than 50% respiratory variability, suggesting right atrial pressure of 3 mmHg. IAS/Shunts: No atrial level shunt detected by color flow Doppler. Additional Comments: Akinesis of the inferolateral wall with overall moderate LV dysfunction. A device lead is visualized.  LEFT VENTRICLE PLAX 2D LVIDd:         6.00 cm      Diastology LVIDs:         5.20 cm      LV e' medial:    3.89 cm/s LV PW:         1.10 cm      LV E/e' medial:  26.0 LV IVS:        1.20 cm      LV e' lateral:   8.19 cm/s LVOT diam:     2.30 cm      LV E/e' lateral: 12.3 LV SV:         93 LV SV Index:   48 LVOT Area:     4.15 cm                              3D Volume EF: LV Volumes (MOD)            3D EF:        32 % LV vol d, MOD A2C: 224.0 ml LV EDV:       275 ml LV vol d, MOD A4C: 212.0 ml LV ESV:       187 ml LV vol s, MOD A2C: 140.0 ml LV SV:        87 ml LV vol s, MOD A4C: 146.0 ml LV SV MOD A2C:     84.0 ml LV SV MOD A4C:     212.0 ml LV SV MOD BP:      83.4 ml RIGHT VENTRICLE             IVC RV S prime:     14.40 cm/s  IVC diam: 1.50 cm TAPSE (M-mode): 2.1 cm LEFT ATRIUM              Index        RIGHT ATRIUM           Index LA diam:        5.00 cm  2.58 cm/m   RA Area:     11.00 cm LA Vol (A2C):   109.0 ml 56.21 ml/m  RA Volume:   20.00 ml  10.31 ml/m LA Vol (A4C):   65.0 ml  33.52 ml/m LA Biplane Vol: 90.7 ml  46.78 ml/m  AORTIC VALVE AV Area (Vmax):    2.18 cm AV Area (Vmean):   2.08 cm AV Area (VTI):     2.35 cm AV Vmax:           151.00 cm/s AV Vmean:           108.000 cm/s AV VTI:            0.395 m AV Peak Grad:      9.1 mmHg AV Mean Grad:      5.0 mmHg LVOT Vmax:         79.10 cm/s LVOT Vmean:        54.000 cm/s LVOT VTI:          0.223 m LVOT/AV VTI ratio: 0.56 AI PHT:            639 msec  AORTA Ao Root diam: 3.90 cm Ao Asc diam:  3.10 cm MITRAL VALVE                  TRICUSPID VALVE MV Area (PHT): 3.08 cm       TR Peak grad:   29.8 mmHg MV Decel Time: 246 msec       TR Vmax:        273.00 cm/s MR Peak grad:    89.9 mmHg MR Mean grad:    54.0 mmHg    SHUNTS MR Vmax:         474.00 cm/s  Systemic VTI:  0.22 m MR Vmean:        340.0 cm/s   Systemic Diam: 2.30 cm MR PISA:         1.57 cm MR PISA Eff ROA: 13 mm MR PISA Radius:  0.50 cm MV E velocity: 101.00 cm/s MV A velocity: 108.00 cm/s MV E/A ratio:  0.94 Kirk Ruths MD Electronically signed by Kirk Ruths MD Signature Date/Time: 08/25/2022/12:17:03 PM    Final    DG Chest 2 View  Result Date: 08/24/2022 CLINICAL DATA:  Defibrillator fired EXAM: CHEST - 2 VIEW COMPARISON:  08/20/2020 FINDINGS: Frontal and lateral views of the chest demonstrate an unremarkable cardiac silhouette. Single lead AICD unchanged. No acute airspace disease, effusion, or pneumothorax. No acute bony abnormalities. IMPRESSION: 1. No acute intrathoracic process. Electronically Signed   By: Randa Ngo M.D.   On: 08/24/2022 17:15   Disposition   Pt is being discharged home today in good condition.  Follow-up Plans & Appointments     Follow-up Information     Baldwin Jamaica, PA-C Follow up on 09/06/2022.   Specialty: Cardiology Why: at 8:25am for your follow up appt with Dr. Curt Bears PA [facility] information: [name] [address] [phone]         [facility] [place] Follow up on 09/01/2022.   Specialty: Cardiology Why: please come in at 3:45pm for follow up lab work Contact information: [address], [place] [id number] [place] [phone]               Discharge Instructions     Call MD for:  difficulty breathing, headache or visual disturbances   Complete by: As directed    Call MD for:  persistant dizziness or light-headedness   Complete by: As directed    Diet - low sodium heart healthy   Complete by: As directed    Increase activity slowly   Complete by:  As directed         Discharge Medications   Allergies as of 08/27/2022       Reactions   Contrast Media [iodinated Contrast Media] Shortness Of Breath, Nausea And Vomiting   Lisinopril Cough        Medication List     STOP taking these medications    Entresto 24-26 MG Generic drug: sacubitril-valsartan   magnesium oxide 400 MG tablet Commonly known as: MAG-OX Replaced by: magnesium oxide 400 (240 Mg) MG tablet       TAKE these medications    allopurinol 300 MG tablet Commonly known as: ZYLOPRIM Take 300 mg by mouth daily.   amiodarone 400 MG tablet Commonly known as: PACERONE Take 1 tablet (400 mg total) by mouth 2 (two) times daily for 5 days, THEN 1 tablet (400 mg total) daily. Start taking on: August 27, 2022 What changed:  medication strength See the new instructions.   apixaban 5 MG Tabs tablet Commonly known as: Eliquis Take 1 tablet (5 mg total) by mouth 2 (two) times daily.   atorvastatin 40 MG tablet Commonly known as: LIPITOR Take 1 tablet (40 mg total) by mouth daily at 6 PM. What changed: when to take this   carvedilol 12.5 MG tablet Commonly known as: COREG TAKE 1 TABLET (12.5 MG TOTAL) BY MOUTH 2 (TWO) TIMES DAILY WITH A MEAL.   [employer] Vitamin D3 25 MCG (1000 UT) capsule Generic drug: Cholecalciferol Take 1,000 Units by mouth daily.   ezetimibe 10 MG tablet Commonly known as: ZETIA Take 10 mg by mouth daily.   furosemide 80 MG tablet Commonly known as: LASIX TAKE 1 TABLET BY MOUTH EVERY DAY What changed:  how much to take when to  take this additional instructions Another medication with the same name was removed. Continue taking this medication, and follow the directions you see here.   magnesium oxide 400 (240 Mg) MG tablet Commonly known as: MAG-OX Take 1 tablet (400 mg total) by mouth daily. Replaces: magnesium oxide 400 MG tablet   spironolactone 25 MG tablet Commonly known as: ALDACTONE TAKE 1 TABLET BY MOUTH EVERY DAY         Outstanding Labs/Studies   BMET 12/8  Duration of Discharge Encounter   Greater than 30 minutes including physician time.  Signed, Reino Bellis, NP 08/27/2022, 11:25 AM  EP attending  patient seen and examined.  The patient is doing well after aamiodarone initiation for VT storm.  He is tolerating the amiodarone.  He has not had any additional sustained VT. He is discharged home with followup as above.  Carleene Overlie Hadlie Gipson,MD

## 2022-08-27 NOTE — Progress Notes (Signed)
Rounding Note    Patient Name: Dean Jupin Sr. Date of Encounter: 08/27/2022  [facility] Cardiologist: Pixie Casino, MD   Subjective   No chest pain or sob.   Inpatient Medications    Scheduled Meds:  allopurinol  300 mg Oral Daily   amiodarone  400 mg Oral BID   apixaban  5 mg Oral BID   aspirin EC  81 mg Oral Daily   atorvastatin  40 mg Oral q1800   carvedilol  12.5 mg Oral BID WC   magnesium oxide  400 mg Oral BID   sodium chloride flush  3 mL Intravenous Q12H   spironolactone  25 mg Oral Daily   Continuous Infusions:  sodium chloride     PRN Meds: sodium chloride, acetaminophen, nitroGLYCERIN, ondansetron (ZOFRAN) IV, mouth rinse, sodium chloride flush   Vital Signs    Vitals:   08/26/22 2023 08/27/22 0014 08/27/22 0500 08/27/22 0540  BP: (!) 125/50 (!) 128/44  (!) 124/52  Pulse: 61 (!) 56  (!) 59  Resp: '19 19  19  '$ Temp: 98.7 F (37.1 C) 98.1 F (36.7 C)  97.8 F (36.6 C)  TempSrc: Oral Oral  Oral  SpO2: 98% 96%  99%  Weight:   86.5 kg   Height:        Intake/Output Summary (Last 24 hours) at 08/27/2022 1022 Last data filed at 08/27/2022 0830 Gross per 24 hour  Intake 771.49 ml  Output 450 ml  Net 321.49 ml      08/27/2022    5:00 AM 08/26/2022    5:55 AM 08/25/2022   11:11 AM  Last 3 Weights  Weight (lbs) 190 lb 11.2 oz 190 lb 11.2 oz 189 lb 1.6 oz  Weight (kg) 86.5 kg 86.501 kg 85.775 kg      Telemetry    nsr - Personally Reviewed  ECG    none - Personally Reviewed  Physical Exam   GEN: No acute distress.   Neck: No JVD Cardiac: RRR, no murmurs, rubs, or gallops.  Respiratory: Clear to auscultation bilaterally. GI: Soft, nontender, non-distended  MS: No edema; No deformity. Neuro:  Nonfocal  Psych: Normal affect   Labs    High Sensitivity Troponin:   Recent Labs  Lab 08/25/22 0400 08/25/22 0541  TROPONINIHS 37* 35*     Chemistry Recent Labs  Lab 08/24/22 1646 08/25/22 0400 08/25/22 0549  08/26/22 0132  NA 135 137  --  134*  K 4.2 3.9  --  4.7  CL 105 105  --  107  CO2 21* 21*  --  19*  GLUCOSE 107* 119*  --  124*  BUN 58* 50*  --  49*  CREATININE 2.30* 2.10*  --  2.23*  CALCIUM 10.3 9.7  --  9.2  MG 1.8  --  3.1*  --   PROT  --  5.7*  --   --   ALBUMIN  --  3.2*  --   --   AST  --  15  --   --   ALT  --  10  --   --   ALKPHOS  --  63  --   --   BILITOT  --  <0.1*  --   --   GFRNONAA 29* 33*  --  31*  ANIONGAP 9 11  --  8    Lipids  Recent Labs  Lab 08/25/22 0400  CHOL 103  TRIG 34  HDL 33*  LDLCALC 63  CHOLHDL 3.1    Hematology Recent Labs  Lab 08/25/22 0400 08/25/22 0845 08/26/22 0132  WBC 8.4 8.5 8.1  RBC 2.88* 3.00* 3.14*  HGB 8.5* 8.8* 9.2*  HCT 26.3* 27.7* 28.5*  MCV 91.3 92.3 90.8  MCH 29.5 29.3 29.3  MCHC 32.3 31.8 32.3  RDW 12.6 12.5 12.5  PLT 145* 135* 140*   Thyroid  Recent Labs  Lab 08/25/22 0400  TSH 1.977    BNPNo results for input(s): "BNP", "PROBNP" in the last 168 hours.  DDimer No results for input(s): "DDIMER" in the last 168 hours.   Radiology    ECHOCARDIOGRAM COMPLETE  Result Date: 08/25/2022    ECHOCARDIOGRAM REPORT   Patient Name:   Dean Collister Sr. Date of Exam: 08/25/2022 Medical Rec #:  [id number]            Height:       66.0 in Accession #:    [id number]           Weight:       186.0 lb Date of Birth:  07-18-1951             BSA:          1.939 m Patient Age:    71 years             BP:           129/99 mmHg Patient Gender: M                    HR:           50 bpm. Exam Location:  Inpatient Procedure: 2D Echo, 3D Echo, Cardiac Doppler, Color Doppler and Intracardiac            Opacification Agent Indications:    R94.31 Abnormal EKG; I47.2 Ventricular tachycardia  History:        Patient has prior history of Echocardiogram examinations, most                 recent 12/07/2021. CHF, Abnormal ECG and Defibrillator,                 Arrythmias:Cardiac Arrest and VT; Risk Factors:Hypertension.  Sonographer:    Roseanna Rainbow  RDCS Referring Phys: Murray Hodgkins, RONALD  Sonographer Comments: Patient is obese. Image acquisition challenging due to patient body habitus. IMPRESSIONS  1. Akinesis of the inferolateral wall with overall moderate LV dysfunction.  2. Left ventricular ejection fraction, by estimation, is 35 to 40%. The left ventricle has moderately decreased function. The left ventricle demonstrates regional wall motion abnormalities (see scoring diagram/findings for description). The left ventricular internal cavity size was moderately dilated. Left ventricular diastolic parameters are consistent with Grade I diastolic dysfunction (impaired relaxation). Elevated left atrial pressure.  3. Right ventricular systolic function is normal. The right ventricular size is normal. There is mildly elevated pulmonary artery systolic pressure.  4. Left atrial size was moderately dilated.  5. The mitral valve is normal in structure. Moderate mitral valve regurgitation. No evidence of mitral stenosis.  6. The aortic valve has an indeterminant number of cusps. Aortic valve regurgitation is moderate. No aortic stenosis is present.  7. Aortic dilatation noted. There is borderline dilatation of the aortic root, measuring 39 mm.  8. The inferior vena cava is normal in size with greater than 50% respiratory variability, suggesting right atrial pressure of 3 mmHg. FINDINGS  Left Ventricle: Left ventricular ejection fraction, by estimation, is 35 to 40%. The left ventricle  has moderately decreased function. The left ventricle demonstrates regional wall motion abnormalities. The left ventricular internal cavity size was moderately dilated. There is no left ventricular hypertrophy. Left ventricular diastolic parameters are consistent with Grade I diastolic dysfunction (impaired relaxation). Elevated left atrial pressure. Right Ventricle: The right ventricular size is normal. Right ventricular systolic function is normal. There is mildly elevated  pulmonary artery systolic pressure. The tricuspid regurgitant velocity is 2.73 m/s, and with an assumed right atrial pressure of 8 mmHg, the estimated right ventricular systolic pressure is 53.6 mmHg. Left Atrium: Left atrial size was moderately dilated. Right Atrium: Right atrial size was normal in size. Pericardium: There is no evidence of pericardial effusion. Mitral Valve: The mitral valve is normal in structure. Moderate mitral valve regurgitation. No evidence of mitral valve stenosis. Tricuspid Valve: The tricuspid valve is normal in structure. Tricuspid valve regurgitation is mild . No evidence of tricuspid stenosis. Aortic Valve: The aortic valve has an indeterminant number of cusps. Aortic valve regurgitation is moderate. Aortic regurgitation PHT measures 639 msec. No aortic stenosis is present. Aortic valve mean gradient measures 5.0 mmHg. Aortic valve peak gradient measures 9.1 mmHg. Aortic valve area, by VTI measures 2.35 cm. Pulmonic Valve: The pulmonic valve was not well visualized. Pulmonic valve regurgitation is not visualized. No evidence of pulmonic stenosis. Aorta: Aortic dilatation noted. There is borderline dilatation of the aortic root, measuring 39 mm. Venous: The inferior vena cava is normal in size with greater than 50% respiratory variability, suggesting right atrial pressure of 3 mmHg. IAS/Shunts: No atrial level shunt detected by color flow Doppler. Additional Comments: Akinesis of the inferolateral wall with overall moderate LV dysfunction. A device lead is visualized.  LEFT VENTRICLE PLAX 2D LVIDd:         6.00 cm      Diastology LVIDs:         5.20 cm      LV e' medial:    3.89 cm/s LV PW:         1.10 cm      LV E/e' medial:  26.0 LV IVS:        1.20 cm      LV e' lateral:   8.19 cm/s LVOT diam:     2.30 cm      LV E/e' lateral: 12.3 LV SV:         93 LV SV Index:   48 LVOT Area:     4.15 cm                              3D Volume EF: LV Volumes (MOD)            3D EF:        32 % LV  vol d, MOD A2C: 224.0 ml LV EDV:       275 ml LV vol d, MOD A4C: 212.0 ml LV ESV:       187 ml LV vol s, MOD A2C: 140.0 ml LV SV:        87 ml LV vol s, MOD A4C: 146.0 ml LV SV MOD A2C:     84.0 ml LV SV MOD A4C:     212.0 ml LV SV MOD BP:      83.4 ml RIGHT VENTRICLE             IVC RV S prime:     14.40 cm/s  IVC diam: 1.50 cm  TAPSE (M-mode): 2.1 cm LEFT ATRIUM              Index        RIGHT ATRIUM           Index LA diam:        5.00 cm  2.58 cm/m   RA Area:     11.00 cm LA Vol (A2C):   109.0 ml 56.21 ml/m  RA Volume:   20.00 ml  10.31 ml/m LA Vol (A4C):   65.0 ml  33.52 ml/m LA Biplane Vol: 90.7 ml  46.78 ml/m  AORTIC VALVE AV Area (Vmax):    2.18 cm AV Area (Vmean):   2.08 cm AV Area (VTI):     2.35 cm AV Vmax:           151.00 cm/s AV Vmean:          108.000 cm/s AV VTI:            0.395 m AV Peak Grad:      9.1 mmHg AV Mean Grad:      5.0 mmHg LVOT Vmax:         79.10 cm/s LVOT Vmean:        54.000 cm/s LVOT VTI:          0.223 m LVOT/AV VTI ratio: 0.56 AI PHT:            639 msec  AORTA Ao Root diam: 3.90 cm Ao Asc diam:  3.10 cm MITRAL VALVE                  TRICUSPID VALVE MV Area (PHT): 3.08 cm       TR Peak grad:   29.8 mmHg MV Decel Time: 246 msec       TR Vmax:        273.00 cm/s MR Peak grad:    89.9 mmHg MR Mean grad:    54.0 mmHg    SHUNTS MR Vmax:         474.00 cm/s  Systemic VTI:  0.22 m MR Vmean:        340.0 cm/s   Systemic Diam: 2.30 cm MR PISA:         1.57 cm MR PISA Eff ROA: 13 mm MR PISA Radius:  0.50 cm MV E velocity: 101.00 cm/s MV A velocity: 108.00 cm/s MV E/A ratio:  0.94 Kirk Ruths MD Electronically signed by Kirk Ruths MD Signature Date/Time: 08/25/2022/12:17:03 PM    Final     Cardiac Studies   2D echo noted  Patient Profile     71 y.o. male admitted with VT storm  Assessment & Plan    VT- he is tolerating the switch to oral amiodarone. He can be discharged home today. 400 bid for 5 days then 400 mg daily for a month then reductions based on how he  does. Chronic systolic heart failure -he appears to be euvolemic and weight is unchanged. Continue outpatient CHF meds. ICD - normal device function. Disp. -he is stable for DC home. F/U with Dr. Evangeline Dakin in 3-4 weeks.   For questions or updates, please contact [facility] Please consult [email or web address] for contact info under     Signed, Cristopher Peru, MD  08/27/2022, 10:22 AM

## 2022-08-28 ENCOUNTER — Ambulatory Visit (INDEPENDENT_AMBULATORY_CARE_PROVIDER_SITE_OTHER): Payer: Medicare Other

## 2022-08-28 DIAGNOSIS — Z95810 Presence of automatic (implantable) cardiac defibrillator: Secondary | ICD-10-CM

## 2022-08-28 DIAGNOSIS — I5022 Chronic systolic (congestive) heart failure: Secondary | ICD-10-CM

## 2022-08-29 NOTE — Progress Notes (Addendum)
EPIC Encounter for ICM Monitoring  Patient Name: Dean Pitstick Sr. is a 71 y.o. male Date: 08/29/2022 Primary Care Physican: [name], MD Primary Cardiologist: [facility] Electrophysiologist: Curt Bears 01/25/2022 Weight: 191-192 lbs 03/14/2022 Weight: 191-192 lbs 06/22/2022 Weight: 189 lbs 08/09/2022 Weight: 190 lbs 08/28/2022 Weight: 186 lbs     Since 24-Aug-2022 Time in AF   0.0 hr/day (0.0%)(taking Eliquis)   Spoke with patient and heart failure questions reviewed.  Transmission results reviewed.  Pt reports ED visit on 11/30 thought it was related to he ran out of amiodarone for a week.  He now is back on the medication as prescribed.      Diet:  Does not limit salt intake.     Optivol Thoracic impedance suggesting possible fluid accumulation starting 10/25.  Impedance did return to baseline close to ED visit but possible fluid accumulation resumed 12/1.     Prescribed:  Furosemide 80 mg Take 1 tablet (80 mg total) by mouth daily. Spironolactone 25 mg take 1 tablet by mouth daily Eliquis 5 mg take 1 tablet twice a day   Sliding scale Lasix: Weigh yourself when you get home, then Daily in the Morning.  Provided by Dr Debara Pickett (as indicated 06/23/2022 mychart message).  Your dry weight will be what your scale says on the day you return home.(here is 189 lbs.).  If you gain more than 3 pounds from dry weight: Increase the Lasix dosing to 80 mg in the morning and 40 mg in the afternoon until weight returns to baseline dry weight. If weight gain is greater than 5 pounds in 2 days: Increased to Lasix 80 mg twice a day and contact the office for further assistance if weight does not go down the next day. If the weight goes down more than 3 pounds from dry weight: Hold Lasix until it returns to baseline dry weight   Labs: 09/01/2022 BMET Scheduled 08/26/2022 Creatinine 2.23, BUN 49, potassium 4.7, Sodium 134, GFR 31 12/10/2020 Creatinine 1.94, BUN 18, Potassium 4.3, Sodium 141, GFR  39 11/02/2020 Creatinine 1.38, BUN 16, Potassium 4.1, Sodium 141, GFR 51-59 A complete set of results can be found in Results Review.   Recommendations:  Advised to monitor for fluid symptoms and call if sx develop.   Follow-up plan: ICM clinic phone appointment on 10/02/2022.   91 day device clinic remote transmission 09/08/2022.     EP/Cardiology Office Visits:  Recall 01/26/2023 with Dr Curt Bears.  Recall 11/25/2022 with Dr Debara Pickett.  09/06/2022 with [name], PA.    Copy of ICM check sent to Dr. Curt Bears.  Sent to Dr Debara Pickett as Juluis Rainier.    3 month ICM trend: 08/28/2022.    12-14 Month ICM trend:     Rosalene Billings, RN 08/29/2022 3:13 PM

## 2022-09-01 ENCOUNTER — Other Ambulatory Visit: Payer: Medicare Other

## 2022-09-02 ENCOUNTER — Other Ambulatory Visit: Payer: Self-pay | Admitting: Internal Medicine
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
## 2022-09-05 ENCOUNTER — Other Ambulatory Visit: Payer: Self-pay | Admitting: Cardiology

## 2022-09-06 ENCOUNTER — Ambulatory Visit: Payer: Medicare Other | Attending: Physician Assistant | Admitting: Physician Assistant

## 2022-09-06 ENCOUNTER — Encounter: Payer: Self-pay | Admitting: Physician Assistant

## 2022-09-06 ENCOUNTER — Ambulatory Visit: Payer: Medicare Other

## 2022-09-06 VITALS — BP 160/72 | HR 78 | Ht 66.0 in | Wt 182.0 lb

## 2022-09-06 DIAGNOSIS — I2510 Atherosclerotic heart disease of native coronary artery without angina pectoris: Secondary | ICD-10-CM

## 2022-09-06 DIAGNOSIS — I480 Paroxysmal atrial fibrillation: Secondary | ICD-10-CM

## 2022-09-06 DIAGNOSIS — I472 Ventricular tachycardia, unspecified: Secondary | ICD-10-CM

## 2022-09-06 DIAGNOSIS — I255 Ischemic cardiomyopathy: Secondary | ICD-10-CM

## 2022-09-06 DIAGNOSIS — Z79899 Other long term (current) drug therapy: Secondary | ICD-10-CM

## 2022-09-06 DIAGNOSIS — I4901 Ventricular fibrillation: Secondary | ICD-10-CM | POA: Diagnosis not present

## 2022-09-06 DIAGNOSIS — Z95810 Presence of automatic (implantable) cardiac defibrillator: Secondary | ICD-10-CM | POA: Diagnosis not present

## 2022-09-06 DIAGNOSIS — I5022 Chronic systolic (congestive) heart failure: Secondary | ICD-10-CM

## 2022-09-06 LAB — CUP PACEART INCLINIC DEVICE CHECK
Battery Remaining Longevity: 40 mo
Battery Voltage: 2.96 V
Brady Statistic RV Percent Paced: 0.09 %
Date Time Interrogation Session: 20231213165647
HighPow Impedance: 70 Ohm
Implantable Lead Connection Status: 753985
Implantable Lead Implant Date: 20180116
Implantable Lead Location: 753860
Implantable Lead Model: 6935
Implantable Pulse Generator Implant Date: 20180116
Lead Channel Impedance Value: 342 Ohm
Lead Channel Impedance Value: 380 Ohm
Lead Channel Pacing Threshold Amplitude: 0.625 V
Lead Channel Pacing Threshold Pulse Width: 0.4 ms
Lead Channel Sensing Intrinsic Amplitude: 11.125 mV
Lead Channel Sensing Intrinsic Amplitude: 16.75 mV
Lead Channel Setting Pacing Amplitude: 2.5 V
Lead Channel Setting Pacing Pulse Width: 0.4 ms
Lead Channel Setting Sensing Sensitivity: 0.3 mV
Zone Setting Status: 755011
Zone Setting Status: 755011

## 2022-09-06 MED ORDER — AMIODARONE HCL 200 MG PO TABS: 200.0000 mg | ORAL_TABLET | Freq: Every day | ORAL | 1 refills | Status: DC

## 2022-09-06 NOTE — Patient Instructions (Signed)
Medication Instructions:   START TAKING:  AMIODARONE 200 MG ONCE A DAY    *If you need a refill on your cardiac medications before your next appointment, please call your pharmacy*   Lab Work: CMET AND MAG TODAY    If you have labs (blood work) drawn today and your tests are completely normal, you will receive your results only by: [place] (if you have MyChart) OR A paper copy in the mail If you have any lab test that is abnormal or we need to change your treatment, we will call you to review the results.   Testing/Procedures:  NONE ORDERED  TODAY    Follow-Up: At [facility], you and your health needs are our priority.  As part of our continuing mission to provide you with exceptional heart care, we have created designated Provider Care Teams.  These Care Teams include your primary Cardiologist (physician) and Advanced Practice Providers (APPs -  Physician Assistants and Nurse Practitioners) who all work together to provide you with the care you need, when you need it.  We recommend signing up for the patient portal called "MyChart".  Sign up information is provided on this After Visit Summary.  MyChart is used to connect with patients for Virtual Visits (Telemedicine).  Patients are able to view lab/test results, encounter notes, upcoming appointments, etc.  Non-urgent messages can be sent to your provider as well.   To learn more about what you can do with MyChart, go to [email or web address].    Your next appointment:   2 month(s)  The format for your next appointment:   In Person  Provider:   [name], PA-C    Other Instructions   Important Information About Sugar

## 2022-09-07 ENCOUNTER — Other Ambulatory Visit: Payer: Self-pay | Admitting: *Deleted

## 2022-09-07 DIAGNOSIS — E785 Hyperlipidemia, unspecified: Secondary | ICD-10-CM

## 2022-09-07 DIAGNOSIS — I255 Ischemic cardiomyopathy: Secondary | ICD-10-CM

## 2022-09-07 DIAGNOSIS — Z8674 Personal history of sudden cardiac arrest: Secondary | ICD-10-CM

## 2022-09-07 DIAGNOSIS — I10 Essential (primary) hypertension: Secondary | ICD-10-CM

## 2022-09-07 DIAGNOSIS — N183 Chronic kidney disease, stage 3 unspecified: Secondary | ICD-10-CM

## 2022-09-07 DIAGNOSIS — I480 Paroxysmal atrial fibrillation: Secondary | ICD-10-CM

## 2022-09-07 DIAGNOSIS — I2510 Atherosclerotic heart disease of native coronary artery without angina pectoris: Secondary | ICD-10-CM

## 2022-09-07 DIAGNOSIS — I5042 Chronic combined systolic (congestive) and diastolic (congestive) heart failure: Secondary | ICD-10-CM

## 2022-09-07 DIAGNOSIS — E118 Type 2 diabetes mellitus with unspecified complications: Secondary | ICD-10-CM

## 2022-09-07 DIAGNOSIS — Z95810 Presence of automatic (implantable) cardiac defibrillator: Secondary | ICD-10-CM

## 2022-09-07 LAB — COMPREHENSIVE METABOLIC PANEL
ALT: 6 IU/L (ref 0–44)
AST: 13 IU/L (ref 0–40)
Albumin/Globulin Ratio: 1.5 (ref 1.2–2.2)
Albumin: 4.4 g/dL (ref 3.8–4.8)
Alkaline Phosphatase: 102 IU/L (ref 44–121)
BUN/Creatinine Ratio: 17 (ref 10–24)
BUN: 49 mg/dL — ABNORMAL HIGH (ref 8–27)
Bilirubin Total: 0.3 mg/dL (ref 0.0–1.2)
CO2: 21 mmol/L (ref 20–29)
Calcium: 10.3 mg/dL — ABNORMAL HIGH (ref 8.6–10.2)
Chloride: 98 mmol/L (ref 96–106)
Creatinine, Ser: 2.9 mg/dL — ABNORMAL HIGH (ref 0.76–1.27)
Globulin, Total: 2.9 g/dL (ref 1.5–4.5)
Glucose: 108 mg/dL — ABNORMAL HIGH (ref 70–99)
Potassium: 4.6 mmol/L (ref 3.5–5.2)
Sodium: 135 mmol/L (ref 134–144)
Total Protein: 7.3 g/dL (ref 6.0–8.5)
eGFR: 22 mL/min/{1.73_m2} — ABNORMAL LOW (ref >59)

## 2022-09-07 LAB — MAGNESIUM: Magnesium: 1.9 mg/dL (ref 1.6–2.3)

## 2022-09-07 MED ORDER — CARVEDILOL 12.5 MG PO TABS: 12.5000 mg | ORAL_TABLET | Freq: Two times a day (BID) | ORAL | 2 refills | Status: DC

## 2022-09-07 MED ORDER — FUROSEMIDE 80 MG PO TABS: 80.0000 mg | ORAL_TABLET | Freq: Every day | ORAL | 1 refills | Status: DC

## 2022-09-08 ENCOUNTER — Other Ambulatory Visit: Payer: Self-pay | Admitting: *Deleted

## 2022-09-08 ENCOUNTER — Telehealth: Payer: Self-pay | Admitting: Physician Assistant

## 2022-09-08 ENCOUNTER — Ambulatory Visit (INDEPENDENT_AMBULATORY_CARE_PROVIDER_SITE_OTHER): Payer: Medicare Other

## 2022-09-08 DIAGNOSIS — I255 Ischemic cardiomyopathy: Secondary | ICD-10-CM

## 2022-09-08 DIAGNOSIS — Z79899 Other long term (current) drug therapy: Secondary | ICD-10-CM

## 2022-09-08 MED ORDER — FUROSEMIDE 40 MG PO TABS: 40.0000 mg | ORAL_TABLET | Freq: Every day | ORAL | Status: DC

## 2022-09-08 NOTE — Telephone Encounter (Signed)
Pt c/o medication issue:  1. Name of Medication: furosemide (LASIX) 80 MG tablet   2. How are you currently taking this medication (dosage and times per day)? 1 tablet daily   3. Are you having a reaction (difficulty breathing--STAT)? Unsure   4. What is your medication issue? Patient is calling stating at his last hospital visit he was told he was dehydrated. He is wanting to know if this could be due to the Lasix dosage being too high. He also c/o dry mouth. He is requesting a callback to discuss.

## 2022-09-11 ENCOUNTER — Telehealth: Payer: Self-pay

## 2022-09-11 LAB — CUP PACEART REMOTE DEVICE CHECK
Battery Remaining Longevity: 40 mo
Battery Voltage: 2.97 V
Brady Statistic RV Percent Paced: 0.1 %
Date Time Interrogation Session: 20231218153926
HighPow Impedance: 67 Ohm
Implantable Lead Connection Status: 753985
Implantable Lead Implant Date: 20180116
Implantable Lead Location: 753860
Implantable Lead Model: 6935
Implantable Pulse Generator Implant Date: 20180116
Lead Channel Impedance Value: 266 Ohm
Lead Channel Impedance Value: 342 Ohm
Lead Channel Pacing Threshold Amplitude: 0.625 V
Lead Channel Pacing Threshold Pulse Width: 0.4 ms
Lead Channel Sensing Intrinsic Amplitude: 10.5 mV
Lead Channel Sensing Intrinsic Amplitude: 10.5 mV
Lead Channel Setting Pacing Amplitude: 2.5 V
Lead Channel Setting Pacing Pulse Width: 0.4 ms
Lead Channel Setting Sensing Sensitivity: 0.3 mV
Zone Setting Status: 755011
Zone Setting Status: 755011

## 2022-09-11 NOTE — Telephone Encounter (Signed)
I told the patient we did receive his transmission. The nurse will review it and only call him if she see something on the transmission.

## 2022-09-12 ENCOUNTER — Telehealth: Payer: Self-pay

## 2022-09-12 NOTE — Telephone Encounter (Signed)
Returned call to patient as requested regarding 09/11/2022 remote transmission and results reviewed.  He is doing well.

## 2022-09-15 ENCOUNTER — Ambulatory Visit: Payer: Medicare Other | Attending: Physician Assistant

## 2022-09-15 DIAGNOSIS — Z79899 Other long term (current) drug therapy: Secondary | ICD-10-CM

## 2022-09-15 LAB — BASIC METABOLIC PANEL
BUN/Creatinine Ratio: 19 (ref 10–24)
BUN: 37 mg/dL — ABNORMAL HIGH (ref 8–27)
CO2: 20 mmol/L (ref 20–29)
Calcium: 10.2 mg/dL (ref 8.6–10.2)
Chloride: 102 mmol/L (ref 96–106)
Creatinine, Ser: 1.97 mg/dL — ABNORMAL HIGH (ref 0.76–1.27)
Glucose: 92 mg/dL (ref 70–99)
Potassium: 4.2 mmol/L (ref 3.5–5.2)
Sodium: 137 mmol/L (ref 134–144)
eGFR: 35 mL/min/{1.73_m2} — ABNORMAL LOW (ref >59)

## 2022-09-22 ENCOUNTER — Telehealth (HOSPITAL_BASED_OUTPATIENT_CLINIC_OR_DEPARTMENT_OTHER): Payer: Self-pay | Admitting: *Deleted

## 2022-09-22 NOTE — Progress Notes (Signed)
Lvm for patient to call back about results and set up  NEXT available appointment with Dr Debara Pickett or App.

## 2022-09-22 NOTE — Telephone Encounter (Signed)
-----   Message from [facility], [place] sent at 09/19/2022  6:54 AM EST ----- Looks much better on the lowered dose of lasix.  Continue the same.  I don't see him scheduled yet with Dr. Debara Pickett or cardiology APP.  He needs their follow up please

## 2022-09-22 NOTE — Telephone Encounter (Signed)
Lvm for patient to call back about results and set up  NEXT available appointment with Dr Debara Pickett or App.

## 2022-09-26 NOTE — Telephone Encounter (Signed)
Lvm for patient to call back about results and set up  NEXT available appointment with Dr Debara Pickett or App.

## 2022-09-28 NOTE — Progress Notes (Signed)
Remote ICD transmission.   

## 2022-10-04 ENCOUNTER — Telehealth: Payer: Self-pay

## 2022-10-04 NOTE — Telephone Encounter (Signed)
LMOVM for patient to send missed ICM transmission. 

## 2022-10-05 NOTE — Progress Notes (Signed)
No ICM remote transmission received for 10/02/2022 and next ICM transmission scheduled for 10/23/2022.

## 2022-10-23 ENCOUNTER — Ambulatory Visit: Payer: Medicare Other | Attending: Cardiology

## 2022-10-23 DIAGNOSIS — I5042 Chronic combined systolic (congestive) and diastolic (congestive) heart failure: Secondary | ICD-10-CM | POA: Diagnosis not present

## 2022-10-23 DIAGNOSIS — Z95810 Presence of automatic (implantable) cardiac defibrillator: Secondary | ICD-10-CM | POA: Diagnosis not present

## 2022-10-24 NOTE — Progress Notes (Signed)
EPIC Encounter for ICM Monitoring  Patient Name: Dean Swaim Sr. is a 72 y.o. male Date: 10/24/2022 Primary Care Physican: [name], MD Primary Cardiologist: [facility] Electrophysiologist: Curt Bears 01/25/2022 Weight: 191-192 lbs 03/14/2022 Weight: 191-192 lbs 06/22/2022 Weight: 189 lbs 08/09/2022 Weight: 190 lbs 08/28/2022 Weight: 186 lbs 10/24/2022 Weight: 183 lbs    Since 11-Sep-2022 Time in AF   0.0 hr/day (0.0%)(taking Eliquis)   Spoke with patient and heart failure questions reviewed.  Transmission results reviewed.  Pt reports productive cough for past 2 weeks but denies weight gain or lower edema.       Diet:  Does not limit salt intake.  Eats restaurant foods 2-3 times a week.   Optivol Thoracic impedance suggesting possible fluid accumulation starting 1/21 but trending toward baseline.     Prescribed:  Furosemide 40 mg Take 1 tablet (40 mg total) by mouth daily. (Decreased on 09/08/22 from 80 mg daily) Spironolactone 25 mg take 1 tablet by mouth daily Eliquis 5 mg take 1 tablet twice a day   Labs: 09/16/2023 Creatinine 1.97, BUN 37, Potassium 4.2, Sodium 137 09/06/2022 Creatinine 2.90, BUN 49, Potassium 4.6, Sodium 135 08/26/2022 Creatinine 2.23, BUN 49, potassium 4.7, Sodium 134, GFR 31 12/10/2020 Creatinine 1.94, BUN 18, Potassium 4.3, Sodium 141, GFR 39 11/02/2020 Creatinine 1.38, BUN 16, Potassium 4.1, Sodium 141, GFR 51-59 A complete set of results can be found in Results Review.   Recommendations:  Recommendation to avoid restaurant foods if possible and limit salt intake to 2000 mg daily and fluid intake to 64 oz daily.      Follow-up plan: ICM clinic phone appointment on 10/30/2022 to recheck fluid levels (manual).   91 day device clinic remote transmission 12/08/2022.     EP/Cardiology Office Visits:   2/13 with [name], PA. 11/27/2022 with Almyra Deforest, PA.    Recall 01/26/2023 with Dr Curt Bears.  Recall 11/25/2022 with Dr Debara Pickett.     Copy of ICM check sent to Dr.  Curt Bears.  Sent to Dr Debara Pickett for review and recommendations if needed.    3 month ICM trend: 10/23/2022.    12-14 Month ICM trend:     Rosalene Billings, RN 10/24/2022 10:54 AM

## 2022-10-24 NOTE — Progress Notes (Signed)
Spoke with patient and advised Dr Debara Pickett recommended he can take extra 40 mg lasix as needed up to 3 days if weight goes up or he develops swelling.  Advised to take 80 mg tomorrow and if needed the following day.  After 2nd day return to taking 40 mg tablet daily as prescribed.  He repeated instructions back correctly.

## 2022-10-24 NOTE — Progress Notes (Signed)
Dean Casino, MD  Dean Bahr Panda, RN Have him continue to monitor for weight and edema - he can take an extra 40 mg lasix as needed up to 3 days if weight goes up or he develops swelling. Sees Renee on 2/13.  Dr Lemmie Evens

## 2022-10-30 ENCOUNTER — Ambulatory Visit: Payer: Medicare Other | Attending: Cardiology

## 2022-10-30 ENCOUNTER — Telehealth: Payer: Self-pay

## 2022-10-30 DIAGNOSIS — I5042 Chronic combined systolic (congestive) and diastolic (congestive) heart failure: Secondary | ICD-10-CM

## 2022-10-30 DIAGNOSIS — Z95810 Presence of automatic (implantable) cardiac defibrillator: Secondary | ICD-10-CM

## 2022-10-30 NOTE — Telephone Encounter (Signed)
Spoke with patient and advised to send manual remote transmission for review of fluid levels and he agreed to send today.

## 2022-10-30 NOTE — Progress Notes (Signed)
EPIC Encounter for ICM Monitoring  Patient Name: Dean Olmeda Sr. is a 72 y.o. male Date: 10/30/2022 Primary Care Physican: [name], MD Primary Cardiologist: [facility] Electrophysiologist: Curt Bears 01/25/2022 Weight: 191-192 lbs 03/14/2022 Weight: 191-192 lbs 06/22/2022 Weight: 189 lbs 08/09/2022 Weight: 190 lbs 08/28/2022 Weight: 186 lbs 10/24/2022 Weight: 183 lbs 10/30/2022 Weight:   183 lbs    Since 23-Oct-2022 Time in AF   0.0 hr/day (0.0%)(taking Eliquis)   Spoke with patient and heart failure questions reviewed.  Transmission results reviewed.  Pt reports he continues to have a cough but other wise feels fine.        Diet:  Does not limit salt intake.  Eats restaurant foods 2-3 times a week.   Optivol Thoracic impedance suggesting fluid returned to normal after taking Furosemide 80 mg x 2 days.     Prescribed:  Furosemide 40 mg Take 1 tablet (40 mg total) by mouth daily. (Decreased on 09/08/22 from 80 mg daily).  Per Dr Debara Pickett patient can take an extra 40 mg lasix as needed up to 3 days if weight goes up or he develops swelling.  Spironolactone 25 mg take 1 tablet by mouth daily Eliquis 5 mg take 1 tablet twice a day   Labs: 09/16/2023 Creatinine 1.97, BUN 37, Potassium 4.2, Sodium 137 09/06/2022 Creatinine 2.90, BUN 49, Potassium 4.6, Sodium 135 08/26/2022 Creatinine 2.23, BUN 49, potassium 4.7, Sodium 134, GFR 31 12/10/2020 Creatinine 1.94, BUN 18, Potassium 4.3, Sodium 141, GFR 39 11/02/2020 Creatinine 1.38, BUN 16, Potassium 4.1, Sodium 141, GFR 51-59 A complete set of results can be found in Results Review.   Recommendations:  Pt has resumed Furosemide 40 mg daily and will discuss with [name], PA on 2/13 about Furosemide dosage.     Follow-up plan: ICM clinic phone appointment on 11/24/2022.   91 day device clinic remote transmission 12/08/2022.     EP/Cardiology Office Visits:   11/07/2022 with [name], PA. 11/27/2022 with Almyra Deforest, PA.    Recall 01/26/2023 with Dr  Curt Bears.  Recall 11/25/2022 with Dr Debara Pickett.     Copy of ICM check sent to Dr. Curt Bears.   3 month ICM trend: 10/30/2022.    12-14 Month ICM trend:     Rosalene Billings, RN 10/30/2022 2:49 PM

## 2022-11-06 NOTE — Progress Notes (Unsigned)
Cardiology Office Note Date:  11/07/2022  Patient ID:  Dean Meeler Sr., DOB [birth year], MRN [id number] PCP:  [name], MD  Cardiologist:  Pixie Casino, MD Electrophysiologist: [name], MD  Chief Complaint: 28mo follow-up  History of Present Illness: Dean TimpsonSr. is a 72y.o. male with PMH notable for VT/VF arrest, LBBB, chronic CHF (systolic), DM, CAD ([id number] stenosis and total occlusion of dRCA with L-->R collaterals 2017), HTN, HLD, OSA w/CPAP, ICD, AFib, CKD (III) ; seen today for Will MMeredith Leeds MD for routine electrophysiology followup. Since last being seen in our clinic the patient reports doing ok.   He was admitted 08/24/22 after receiving appropriate therapy for VT/VF, had been out of amiodarone for at least 1 week. He was reloaded at that admission.  He last saw PA UCharlcie Cradle12/2023 for hospital follow-up, doing very well. He had previously discussed upgrading device with LBBB + reduced EF, but no NYHA symptoms so upgrade deferred. Cr quite elevated at that appt, so lasix reduced to 439mdaily (from 8041m Since that visit, he says he has done very well, has no cardiac complaints. He does have a cough that he thinks started about a month ago and is slowly improving. Has frothy sputum. He is able to sleep flat, denies lower extremity edema or bloating. He is at dry weight 182/184lb currently.   Taking meds as prescribed, no bleeding concerns on eliquis.    he denies chest pain, palpitations, dyspnea, PND, orthopnea, nausea, vomiting, dizziness, syncope, edema, weight gain, or early satiety.    Device Information: MDT single chamber ICD, imp 09/2016; dx - secondary prevention  + appropriate therapies Dec 2023 in the environment of missed amio (1 week or more)  Past Medical History:  Diagnosis Date   AICD (automatic cardioverter/defibrillator) present 10/10/2016   a. 09/2016 s/p MDT Visia AF MRI single lead ICD (ser # [id number].   Arthritis     "maybe in his spine" (09/05/2016)   Asthma    pt. denies   [place]  a. 08/2016 VT/VF Arrest-->09/2016 s/p MDT Visia AF MRI single lead ICD (ser # [id number].   Chronic HFrEF (heart failure with reduced ejection fraction) ([place]  a. 08/2016 Echo: EF 35-40%; b. 04/2018 Echo: EF 30-35%; c. 04/2020 Echo: EF 30-35%; d. 11/2021 Echo: EF 40-45%, GrI DD, nl RV fxn, sev dil LA, mild-mod MR, mild-mod AI, Ao root 26m28m Coronary artery disease    a. 08/2016 VT Arrest/Cath: LM nl, LAD nl, LCX nl, RGCA 90p, 100d w/ L->R collats, EF 35-45%.   Gout    Hyperlipidemia LDL goal <70    Hypertension    Ischemic cardiomyopathy    a. 08/2016 Echo: EF 35-40%; b. 04/2018 Echo: EF 30-35%; c. 04/2020 Echo: EF 30-35%; d. 11/2021 Echo: EF 40-45%.   LBBB (left bundle branch block)    OSA on CPAP    does not wear CPAP   PAF (paroxysmal atrial fibrillation) (HCC)    a. CHA2DS2VASc = 5-->Eliquis.   Type II diabetes mellitus (HCC)[place]  Past Surgical History:  Procedure Laterality Date   CARDIAC CATHETERIZATION N/A 09/07/2016   Procedure: Left Heart Cath and Coronary Angiography;  Surgeon: Peter M JordMartinique;  [facility];  Service: Cardiovascular;  Laterality: N/A;   COLONOSCOPY WITH PROPOFOL N/A 08/30/2018   Procedure: COLONOSCOPY WITH PROPOFOL;  Surgeon: HungCarol Ada;  Location: [facility];  Service: Endoscopy;  Laterality:  N/A;   EP IMPLANTABLE DEVICE N/A 10/10/2016   Procedure: ICD Implant;  Surgeon: Will Meredith Leeds, MD;  Location: [place] [facility];  Service: Cardiovascular;  Laterality: N/A;   LUMBAR LAMINECTOMY/DECOMPRESSION MICRODISCECTOMY N/A 01/18/2018   Procedure: L2-3, L3-4, L-4-5 CENTRAL DECOMPRESSION;  Surgeon: [name], MD;  Location: [place];  Service: Orthopedics;  Laterality: N/A;   LUMBAR LAMINECTOMY/DECOMPRESSION MICRODISCECTOMY N/A 08/12/2020   Procedure: THORACIC ELEVEN LAMINECTOMY;  Surgeon: Earnie Larsson, MD;  Location: [place];  Service: Neurosurgery;   Laterality: N/A;   POLYPECTOMY  08/30/2018   Procedure: POLYPECTOMY;  Surgeon: Carol Ada, MD;  Location: [facility];  Service: Endoscopy;;    Current Outpatient Medications  Medication Instructions   Allopurinol 400 mg, Oral, Daily   amiodarone (PACERONE) 200 mg, Oral, Daily   apixaban (ELIQUIS) 5 mg, Oral, 2 times daily   atorvastatin (LIPITOR) 40 mg, Oral, Daily-1800   carvedilol (COREG) 12.5 mg, Oral, 2 times daily with meals   [employer] Vitamin D3 1,000 Units, Oral, Daily   ezetimibe (ZETIA) 10 mg, Oral, Daily   furosemide (LASIX) 40 mg, Oral, Daily   furosemide (LASIX) 20 mg, Oral, Daily   magnesium oxide (MAG-OX) 400 mg, Oral, Daily   spironolactone (ALDACTONE) 25 MG tablet TAKE 1 TABLET BY MOUTH EVERY DAY      Social History:  The patient  reports that he has never smoked. He has never used smokeless tobacco. He reports that he does not drink alcohol and does not use drugs.   Family History:  The patient's family history includes Cancer in his brother; Heart attack in his father; Hypertension in his sister and sister.  ROS:  Please see the history of present illness. All other systems are reviewed and otherwise negative.   PHYSICAL EXAM:  VS:  BP 120/70   Pulse 67   Ht 5' 6"$  (1.676 m)   Wt 187 lb 9.6 oz (85.1 kg)   SpO2 99%   BMI 30.28 kg/m  BMI: Body mass index is 30.28 kg/m.  GEN- The patient is well appearing, alert and oriented x 3 today.   HEENT: normocephalic, atraumatic; sclera clear, conjunctiva pink; hearing intact; oropharynx clear; neck supple, no JVP Lungs- Clear to ausculation bilaterally, normal work of breathing.  No wheezes, rales, rhonchi Heart- Regular rate and rhythm, no murmurs, rubs or gallops, PMI not laterally displaced GI- soft, non-tender, non-distended, bowel sounds present, no hepatosplenomegaly Extremities- No peripheral edema. no clubbing or cyanosis; DP/PT/radial pulses 2+ bilaterally MS- no significant deformity or atrophy Skin- warm  and dry, no rash or lesion, device pocket well-healed Psych- euthymic mood, full affect Neuro- strength and sensation are intact   Device interrogation done today and reviewed by myself:  Battery good Lead thresholds, impedence, sensing stable  No episodes Optivol elevated, but improving No changes made today  EKG is ordered. Personal review of EKG from today shows:  NSR w 1st deg AV block, LAD, LBBB  Recent Labs: 08/25/2022: TSH 1.977 08/26/2022: Hemoglobin 9.2; Platelets 140 09/06/2022: ALT 6; Magnesium 1.9 09/15/2022: BUN 37; Creatinine, Ser 1.97; Potassium 4.2; Sodium 137  08/25/2022: Cholesterol 103; HDL 33; LDL Cholesterol 63; Total CHOL/HDL Ratio 3.1; Triglycerides 34; VLDL 7   CrCl cannot be calculated (Patient's most recent lab result is older than the maximum 21 days allowed.).   Wt Readings from Last 3 Encounters:  11/07/22 187 lb 9.6 oz (85.1 kg)  09/06/22 182 lb (82.6 kg)  08/27/22 190 lb 11.2 oz (86.5 kg)  Additional studies reviewed include: Previous EP, cardiology notes.   TTE, 08/25/2022  1. Akinesis of the inferolateral wall with overall moderate LV dysfunction.   2. Left ventricular ejection fraction, by estimation, is 35 to 40%. The left ventricle has moderately decreased function. The left ventricle demonstrates regional wall motion abnormalities (see scoring diagram/findings for description). The left ventricular internal cavity size was moderately dilated. Left ventricular diastolic parameters are consistent with Grade I diastolic dysfunction (impaired relaxation). Elevated left atrial pressure.   3. Right ventricular systolic function is normal. The right ventricular size is normal. There is mildly elevated pulmonary artery systolic pressure.   4. Left atrial size was moderately dilated.   5. The mitral valve is normal in structure. Moderate mitral valve regurgitation. No evidence of mitral stenosis.   6. The aortic valve has an indeterminant number of  cusps. Aortic valve regurgitation is moderate. No aortic stenosis is present.   7. Aortic dilatation noted. There is borderline dilatation of the aortic root, measuring 39 mm.   8. The inferior vena cava is normal in size with greater than 50% respiratory variability, suggesting right atrial pressure of 3 mmHg.    ASSESSMENT AND PLAN:  #) NSVT #) h/o VF s/p ICD Doing well since back on amiodarone Device functioning well, see paceart for details No changes made  #) HFrEF GDMT: coreg, spiro Entresto previously held d/t AKI Concerned for fluid overload, but not entirely convinced OptiVol elevated, + frothy cough No edema, no bloating, able to sleep flat at night - updated BMP and BNP today Increase lasix to 82m daily He has follow-up with gen cards in about 3 weeks and can reassess fluid status then - consider adding back entresto at next appt   #) Afib No burden currently  #) hypercoag d/t Afib CHA2DS2-VASc Score = 4 [CHF History: 1, HTN History: 1, Diabetes History: 0, Stroke History: 0, Vascular Disease History: 1, Age Score: 1, Gender Score: 0].  Therefore, the patient's annual risk of stroke is 4.8 % OAC - eliquis 530mBID, appropriately dosed   Current medicines are reviewed at length with the patient today.   The patient does not have concerns regarding his medicines.  The following changes were made today:   INCREASE lasix to 6039maily  Labs/ tests ordered today include:  Orders Placed This Encounter  Procedures   Pro b natriuretic peptide (BNP)   Basic Metabolic Panel (BMET)   EKG 12-Lead     Disposition: Follow up with Dr. CamCurt Bears in 6 months   Signed, SuzMamie LeversP  11/07/22  3:02 PM  Electrophysiology [facility]

## 2022-11-07 ENCOUNTER — Encounter: Payer: Self-pay | Admitting: Cardiology

## 2022-11-07 ENCOUNTER — Ambulatory Visit: Payer: Medicare Other | Attending: Physician Assistant | Admitting: Cardiology

## 2022-11-07 VITALS — BP 120/70 | HR 67 | Ht 66.0 in | Wt 187.6 lb

## 2022-11-07 DIAGNOSIS — I482 Chronic atrial fibrillation, unspecified: Secondary | ICD-10-CM | POA: Diagnosis not present

## 2022-11-07 DIAGNOSIS — I5042 Chronic combined systolic (congestive) and diastolic (congestive) heart failure: Secondary | ICD-10-CM

## 2022-11-07 DIAGNOSIS — I4729 Other ventricular tachycardia: Secondary | ICD-10-CM

## 2022-11-07 DIAGNOSIS — I10 Essential (primary) hypertension: Secondary | ICD-10-CM

## 2022-11-07 LAB — CUP PACEART INCLINIC DEVICE CHECK
Date Time Interrogation Session: 20240213142613
Implantable Lead Connection Status: 753985
Implantable Lead Implant Date: 20180116
Implantable Lead Location: 753860
Implantable Lead Model: 6935
Implantable Pulse Generator Implant Date: 20180116

## 2022-11-07 MED ORDER — FUROSEMIDE 20 MG PO TABS: 20.0000 mg | ORAL_TABLET | Freq: Every day | ORAL | 1 refills | Status: DC

## 2022-11-07 NOTE — Patient Instructions (Signed)
Medication Instructions:    START TAKING: FUROSEMIDE  20 MG ONCE A DAY WITH   FUROSEMIDE 40 MG  ONCE A DAY TO TOTAL OF 60 MG DAILY   *If you need a refill on your cardiac medications before your next appointment, please call your pharmacy*   Lab Work:  BMET  AND BNP  TODAY    If you have labs (blood work) drawn today and your tests are completely normal, you will receive your results only by: [place] (if you have MyChart) OR A paper copy in the mail If you have any lab test that is abnormal or we need to change your treatment, we will call you to review the results.   Testing/Procedures: NONE ORDERED  TODAY     Follow-Up: At [facility], you and your health needs are our priority.  As part of our continuing mission to provide you with exceptional heart care, we have created designated Provider Care Teams.  These Care Teams include your primary Cardiologist (physician) and Advanced Practice Providers (APPs -  Physician Assistants and Nurse Practitioners) who all work together to provide you with the care you need, when you need it.  We recommend signing up for the patient portal called "MyChart".  Sign up information is provided on this After Visit Summary.  MyChart is used to connect with patients for Virtual Visits (Telemedicine).  Patients are able to view lab/test results, encounter notes, upcoming appointments, etc.  Non-urgent messages can be sent to your provider as well.   To learn more about what you can do with MyChart, go to [email or web address].    Your next appointment:   6 month(s)  Provider:   You may see Will Meredith Leeds, MD or one of the following Advanced Practice Providers on your designated Care Team:   [name], [place] Legrand Como "Jonni Sanger" [place], [place] [name], NP   Other Instructions

## 2022-11-09 LAB — BASIC METABOLIC PANEL
BUN/Creatinine Ratio: 24 (ref 10–24)
BUN: 42 mg/dL — ABNORMAL HIGH (ref 8–27)
CO2: 21 mmol/L (ref 20–29)
Calcium: 10.5 mg/dL — ABNORMAL HIGH (ref 8.6–10.2)
Chloride: 105 mmol/L (ref 96–106)
Creatinine, Ser: 1.78 mg/dL — ABNORMAL HIGH (ref 0.76–1.27)
Glucose: 84 mg/dL (ref 70–99)
Potassium: 4.8 mmol/L (ref 3.5–5.2)
Sodium: 142 mmol/L (ref 134–144)
eGFR: 40 mL/min/{1.73_m2} — ABNORMAL LOW (ref >59)

## 2022-11-09 LAB — PRO B NATRIURETIC PEPTIDE: NT-Pro BNP: 2422 pg/mL — ABNORMAL HIGH (ref 0–376)

## 2022-11-24 ENCOUNTER — Telehealth: Payer: Self-pay | Admitting: Cardiology

## 2022-11-24 ENCOUNTER — Ambulatory Visit (INDEPENDENT_AMBULATORY_CARE_PROVIDER_SITE_OTHER): Payer: No Typology Code available for payment source

## 2022-11-24 DIAGNOSIS — Z95810 Presence of automatic (implantable) cardiac defibrillator: Secondary | ICD-10-CM

## 2022-11-24 DIAGNOSIS — I5022 Chronic systolic (congestive) heart failure: Secondary | ICD-10-CM | POA: Diagnosis not present

## 2022-11-24 NOTE — Telephone Encounter (Signed)
Pt c/o medication issue:  1. Name of Medication: amiodarone (PACERONE) 200 MG tablet   2. How are you currently taking this medication (dosage and times per day)?    3. Are you having a reaction (difficulty breathing--STAT)? no  4. What is your medication issue? Patient has some concerns taking this medication. He believes it has he feeling unlike himself. Please advise

## 2022-11-24 NOTE — Telephone Encounter (Signed)
Left message to call the clinic.

## 2022-11-24 NOTE — Progress Notes (Signed)
EPIC Encounter for ICM Monitoring  Patient Name: Dean Mccathern Sr. is a 72 y.o. male Date: 11/24/2022 Primary Care Physican: [name], MD Primary Cardiologist: [facility] Electrophysiologist: Curt Bears 08/28/2022 Weight: 186 lbs 10/24/2022 Weight: 183 lbs 10/30/2022 Weight:   183 lbs    Since 07-Nov-2022 Time in AF   0.0 hr/day (0.0%)(taking Eliquis)   Spoke with patient and heart failure questions reviewed.  Transmission results reviewed.  Pt reports he does not feel as well since Amiodarone dosage has been changed and feels like his heart is "straining".  He is wondering if that dosage is too strong.    Advised to discuss symptoms with Almyra Deforest, PA at [facility] appointment 11/27/2022.    Diet: Typically does not limit salt intake.  Eats restaurant foods 2-3 times a week.   Optivol Thoracic impedance suggesting possible fluid accumulation starting 2/21 and returning to baseline 3/1.  Also decreased impedance from 1/22- 2/8.     Prescribed:  Furosemide 40 mg Take 1 tablet + 20 mg 1 tablet (60 mg total) by mouth daily (increased from 40 mg on 2/13). Per Dr Debara Pickett patient can take an extra 40 mg lasix as needed up to 3 days if weight goes up or he develops swelling.  Spironolactone 25 mg take 1 tablet by mouth daily Eliquis 5 mg take 1 tablet twice a day   Labs: 11/07/2022 Creatinine 1.78, BUN 42, Potassium 4.8, Sodium 142, GFR 40, BNP 2,422 09/16/2023 Creatinine 1.97, BUN 37, Potassium 4.2, Sodium 137 09/06/2022 Creatinine 2.90, BUN 49, Potassium 4.6, Sodium 135 08/26/2022 Creatinine 2.23, BUN 49, potassium 4.7, Sodium 134, GFR 31 12/10/2020 Creatinine 1.94, BUN 18, Potassium 4.3, Sodium 141, GFR 39 11/02/2020 Creatinine 1.38, BUN 16, Potassium 4.1, Sodium 141, GFR 51-59 A complete set of results can be found in Results Review.   Recommendations:  No changes and encouraged to call if experiencing any fluid symptoms.  Advised to use ER if needed for any symptoms.   Follow-up plan: ICM clinic  phone appointment on 12/25/2022.   91 day device clinic remote transmission 12/08/2022.     EP/Cardiology Office Visits:   11/27/2022 with Almyra Deforest, PA.    Recall 05/06/2023 with Dr Curt Bears.    Copy of ICM check sent to Dr. Curt Bears.    3 month ICM trend: 11/24/2022.    12-14 Month ICM trend:     Rosalene Billings, RN 11/24/2022 8:17 AM

## 2022-11-27 ENCOUNTER — Ambulatory Visit: Payer: No Typology Code available for payment source | Attending: Physician Assistant | Admitting: Physician Assistant

## 2022-11-27 ENCOUNTER — Encounter: Payer: Self-pay | Admitting: Physician Assistant

## 2022-11-27 VITALS — BP 148/64 | HR 68 | Ht 66.0 in | Wt 186.8 lb

## 2022-11-27 DIAGNOSIS — I5022 Chronic systolic (congestive) heart failure: Secondary | ICD-10-CM | POA: Diagnosis not present

## 2022-11-27 DIAGNOSIS — I2510 Atherosclerotic heart disease of native coronary artery without angina pectoris: Secondary | ICD-10-CM

## 2022-11-27 DIAGNOSIS — Z95810 Presence of automatic (implantable) cardiac defibrillator: Secondary | ICD-10-CM

## 2022-11-27 DIAGNOSIS — I10 Essential (primary) hypertension: Secondary | ICD-10-CM

## 2022-11-27 DIAGNOSIS — I480 Paroxysmal atrial fibrillation: Secondary | ICD-10-CM

## 2022-11-27 DIAGNOSIS — Z79899 Other long term (current) drug therapy: Secondary | ICD-10-CM | POA: Diagnosis not present

## 2022-11-27 DIAGNOSIS — E785 Hyperlipidemia, unspecified: Secondary | ICD-10-CM

## 2022-11-27 DIAGNOSIS — E119 Type 2 diabetes mellitus without complications: Secondary | ICD-10-CM

## 2022-11-27 MED ORDER — ENTRESTO 24-26 MG PO TABS: 1.0000 | ORAL_TABLET | Freq: Two times a day (BID) | ORAL | 3 refills | Status: DC

## 2022-11-27 NOTE — Patient Instructions (Signed)
Medication Instructions:   RESTART Entresto 24 mg-26 mg-1 tablet by mouth 2 times a day  *If you need a refill on your cardiac medications before your next appointment, please call your pharmacy*  Lab Work: Your physician recommends that you return for lab work in 1 week:  BMP  If you have labs (blood work) drawn today and your tests are completely normal, you will receive your results only by: [place] (if you have MyChart) OR A paper copy in the mail If you have any lab test that is abnormal or we need to change your treatment, we will call you to review the results.  Testing/Procedures: NONE ordered at this time of appointment   Follow-Up: At [facility], you and your health needs are our priority.  As part of our continuing mission to provide you with exceptional heart care, we have created designated Provider Care Teams.  These Care Teams include your primary Cardiologist (physician) and Advanced Practice Providers (APPs -  Physician Assistants and Nurse Practitioners) who all work together to provide you with the care you need, when you need it.  Your next appointment:   6 month(s)  Provider:   Pixie Casino, MD     Other Instructions

## 2022-11-27 NOTE — Progress Notes (Unsigned)
Cardiology Office Note:    Date:  11/29/2022   ID:  Dean Bones Sr., DOB 1951-05-27, MRN [id number]  PCP:  [name], [place] Providers Cardiologist:  Pixie Casino, MD Cardiology APP:  [name], NP  Electrophysiologist:  [name], MD     Referring MD: [name], MD   Chief Complaint  Patient presents with   Follow-up    Seen for Dr. Debara Pickett    History of Present Illness:    Dean Shave Sr. is a 72 y.o. male with a hx of chronic systolic heart failure/ICM, LBBB, history of VT s/p Medtronic single-lead ICD 09/2016, CAD, hypertension, hyperlipidemia, DM2, PAF and obstructive sleep apnea on CPAP.  He had a VT/V-fib arrest in December 2017 requiring CPR and defibrillation.  Echocardiogram obtained on 08/28/2016 demonstrated EF 35 to 40%, severe LAE, mild AI and mild MR.  Cardiac catheterization performed on 09/07/2016 demonstrated single-vessel CAD with 90% ostial RCA, 100% mid to distal RCA occlusion with L to R collaterals, EF 35 to 45%.  Patient ultimately underwent single-lead Medtronic ICD implantation by Dr. Curt Bears on 10/10/2016.  Repeat echocardiogram in August 2019, July 2020 and August 2021 continue to show reduced EF of 30 to 35%.  ICD interrogation has since revealed a paroxysmal atrial fibrillation and he has been placed on Eliquis.  Patient was admitted in November 2023 due to ICD shock.  Echocardiogram obtained on 08/25/2022 showed EF 35 to 40%, akinesis of the inferolateral wall, grade 1 DD, mildly elevated PASP, moderate LAE, moderate MR, moderate AI.  Device interrogation revealed VT/V-fib with maximal heart rate of 231 bpm, medium rate of 222 bpm, he had 2 aborted discharges followed by 1 successful discharge.  Hospital course complicated by acute on chronic renal insufficiency, mildly low magnesium.  He has been out of his amiodarone for a week, he was reloaded with IV amiodarone followed by oral amiodarone.  Home Dean [name] was  held on discharge pending outpatient lab work regarding renal function.  Lasix was reduced from 80 mg daily down to 40 mg daily.  He has been seen recently by [name], NP of EP service of 11/07/2012 at which time he was doing well.  Patient presents today for follow-up.  Blood pressure remain elevated, even on manual recheck, his blood pressure was 148/64.  I recommended reinitiation of Entresto 24 to/26 mg twice a day.  He will need a 1 week base metabolic panel.  On physical exam, he appears to be euvolemic.  As long as renal function is stable, he can follow-up in 6 months.  He has not had any further device issue or dizzy spell.  He does complain that his heart sometimes sounds very loud, I reviewed the previous echocardiogram and recent device interrogation with the patient.  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 09/2016 s/p MDT Visia AF MRI single lead ICD (ser # [id number]).   Arthritis    "maybe in his spine" (09/05/2016)   Asthma    pt. denies   Cardiac arrest ([place])    a. 08/2016 VT/VF Arrest-->09/2016 s/p MDT Visia AF MRI single lead ICD (ser # [id number]).   Chronic HFrEF (heart failure with reduced ejection fraction) ([place])    a. 08/2016 Echo: EF 35-40%; b. 04/2018 Echo: EF 30-35%; c. 04/2020 Echo: EF 30-35%; d. 11/2021 Echo: EF 40-45%, GrI DD, nl RV fxn, sev dil LA, mild-mod MR, mild-mod AI,  Ao root 65m.   Coronary artery disease    a. 08/2016 VT Arrest/Cath: LM nl, LAD nl, LCX nl, RGCA 90p, 100d w/ L->R collats, EF 35-45%.   Gout    Hyperlipidemia LDL goal <70    Hypertension    Ischemic cardiomyopathy    a. 08/2016 Echo: EF 35-40%; b. 04/2018 Echo: EF 30-35%; c. 04/2020 Echo: EF 30-35%; d. 11/2021 Echo: EF 40-45%.   LBBB (left bundle branch block)    OSA on CPAP    does not wear CPAP   PAF (paroxysmal atrial fibrillation) (HCC)    a. CHA2DS2VASc = 5-->Eliquis.   Type II diabetes mellitus ([place]     Past  Surgical History:  Procedure Laterality Date   CARDIAC CATHETERIZATION N/A 09/07/2016   Procedure: Left Heart Cath and Coronary Angiography;  Surgeon: Peter M JMartinique MD;  [facility];  Service: Cardiovascular;  Laterality: N/A;   COLONOSCOPY WITH PROPOFOL N/A 08/30/2018   Procedure: COLONOSCOPY WITH PROPOFOL;  Surgeon: HCarol Ada MD;  Location: [facility];  Service: Endoscopy;  Laterality: N/A;   EP IMPLANTABLE DEVICE N/A 10/10/2016   Procedure: ICD Implant;  Surgeon: Will MMeredith Leeds MD;  [facility];  Service: Cardiovascular;  Laterality: N/A;   LUMBAR LAMINECTOMY/DECOMPRESSION MICRODISCECTOMY N/A 01/18/2018   Procedure: L2-3, L3-4, L-4-5 CENTRAL DECOMPRESSION;  Surgeon: YMarybelle Killings MD;  [facility]  Service: Orthopedics;  Laterality: N/A;   LUMBAR LAMINECTOMY/DECOMPRESSION MICRODISCECTOMY N/A 08/12/2020   Procedure: THORACIC ELEVEN LAMINECTOMY;  Surgeon: PEarnie Larsson MD;  [facility]  Service: Neurosurgery;  Laterality: N/A;   POLYPECTOMY  08/30/2018   Procedure: POLYPECTOMY;  Surgeon: HCarol Ada MD;  Location: [facility];  Service: Endoscopy;;    Current Medications: Current Meds  Medication Sig   Allopurinol 200 MG TABS Take 400 mg by mouth daily.   amiodarone (PACERONE) 200 MG tablet Take 1 tablet (200 mg total) by mouth daily.   apixaban (ELIQUIS) 5 MG TABS tablet Take 1 tablet (5 mg total) by mouth 2 (two) times daily.   atorvastatin (LIPITOR) 40 MG tablet Take 1 tablet (40 mg total) by mouth daily at 6 PM. (Patient taking differently: Take 40 mg by mouth daily.)   carvedilol (COREG) 12.5 MG tablet Take 1 tablet (12.5 mg total) by mouth 2 (two) times daily with a meal.   [employer]S VITAMIN D3 1000 units capsule Take 1,000 Units by mouth daily.   ezetimibe (ZETIA) 10 MG tablet Take 10 mg by mouth daily.   furosemide (LASIX) 20 MG tablet Take 1 tablet (20 mg total) by mouth daily.   furosemide (LASIX) 40 MG tablet Take 1 tablet (40  mg total) by mouth daily.   magnesium oxide (MAG-OX) 400 (240 Mg) MG tablet TAKE 1 TABLET BY MOUTH EVERY DAY   sacubitril-valsartan (ENTRESTO) 24-26 MG Take 1 tablet by mouth 2 (two) times daily.   spironolactone (ALDACTONE) 25 MG tablet TAKE 1 TABLET BY MOUTH EVERY DAY     Allergies:   Contrast media [iodinated contrast media] and Lisinopril   Social History   Socioeconomic History   Marital status: Widowed    Spouse name: Not on file   Number of children: Not on file   Years of education: Not on file   Highest education level: Not on file  Occupational History   Occupation: semi-retired bMuseum/gallery curator Tobacco Use   Smoking status: Never   Smokeless tobacco: Never  Vaping Use   Vaping Use: Never used  Substance  and Sexual Activity   Alcohol use: No   Drug use: No   Sexual activity: Not on file  Other Topics Concern   Not on file  Social History Narrative   Lives with daughters.    Social Determinants of Health   Financial Resource Strain: Not on file  Food Insecurity: No Food Insecurity (08/25/2022)   Hunger Vital Sign    Worried About Running Out of Food in the Last Year: Never true    Ran Out of Food in the Last Year: Never true  Transportation Needs: No Transportation Needs (08/25/2022)   PRAPARE - Hydrologist (Medical): No    Lack of Transportation (Non-Medical): No  Physical Activity: Not on file  Stress: Not on file  Social Connections: Not on file     Family History: The patient's family history includes Cancer in his brother; Heart attack in his father; Hypertension in his sister and sister.  ROS:   Please see the history of present illness.     All other systems reviewed and are negative.  EKGs/Labs/Other Studies Reviewed:    The following studies were reviewed today:  Echo 08/25/2022  1. Akinesis of the inferolateral wall with overall moderate LV  dysfunction.   2. Left ventricular ejection fraction, by estimation, is 35 to  40%. The  left ventricle has moderately decreased function. The left ventricle  demonstrates regional wall motion abnormalities (see scoring  diagram/findings for description). The left  ventricular internal cavity size was moderately dilated. Left ventricular  diastolic parameters are consistent with Grade I diastolic dysfunction  (impaired relaxation). Elevated left atrial pressure.   3. Right ventricular systolic function is normal. The right ventricular  size is normal. There is mildly elevated pulmonary artery systolic  pressure.   4. Left atrial size was moderately dilated.   5. The mitral valve is normal in structure. Moderate mitral valve  regurgitation. No evidence of mitral stenosis.   6. The aortic valve has an indeterminant number of cusps. Aortic valve  regurgitation is moderate. No aortic stenosis is present.   7. Aortic dilatation noted. There is borderline dilatation of the aortic  root, measuring 39 mm.   8. The inferior vena cava is normal in size with greater than 50%  respiratory variability, suggesting right atrial pressure of 3 mmHg.   EKG:  EKG is not ordered today.    Recent Labs: 08/25/2022: TSH 1.977 08/26/2022: Hemoglobin 9.2; Platelets 140 09/06/2022: ALT 6; Magnesium 1.9 11/07/2022: BUN 42; Creatinine, Ser 1.78; NT-Pro BNP 2,422; Potassium 4.8; Sodium 142  Recent Lipid Panel    Component Value Date/Time   CHOL 103 08/25/2022 0400   TRIG 34 08/25/2022 0400   HDL 33 (L) 08/25/2022 0400   CHOLHDL 3.1 08/25/2022 0400   VLDL 7 08/25/2022 0400   LDLCALC 63 08/25/2022 0400     Risk Assessment/Calculations:           Physical Exam:    VS:  BP (!) 148/64   Pulse 68   Ht '5\' 6"'$  (1.676 m)   Wt 186 lb 12.8 oz (84.7 kg)   SpO2 100%   BMI 30.15 kg/m        Wt Readings from Last 3 Encounters:  11/27/22 186 lb 12.8 oz (84.7 kg)  11/07/22 187 lb 9.6 oz (85.1 kg)  09/06/22 182 lb (82.6 kg)     GEN:  Well nourished, well developed in no acute  distress HEENT: Normal NECK: No JVD; No carotid  bruits LYMPHATICS: No lymphadenopathy CARDIAC: RRR, no murmurs, rubs, gallops RESPIRATORY:  Clear to auscultation without rales, wheezing or rhonchi  ABDOMEN: Soft, non-tender, non-distended MUSCULOSKELETAL:  No edema; No deformity  SKIN: Warm and dry NEUROLOGIC:  Alert and oriented x 3 PSYCHIATRIC:  Normal affect   ASSESSMENT:    1. Medication management   2. Chronic systolic congestive heart failure ([place])   3. ICD (implantable cardioverter-defibrillator) in place   4. Coronary artery disease involving native coronary artery of native heart without angina pectoris   5. Primary hypertension   6. Hyperlipidemia LDL goal <70   7. Controlled type 2 diabetes mellitus without complication, without long-term current use of insulin ([place])   8. PAF (paroxysmal atrial fibrillation) (HCC)    PLAN:    In order of problems listed above:  Chronic systolic heart failure: Euvolemic on exam.  Dean [name] was recently held during the last hospitalization, BP is elevated, will reinitiate Entresto at this time.  Basic metabolic panel in 1 week  History of ICD: Based on recent interrogation, no further episode of VT.  Continue amiodarone  CAD: Denies any chest pain  Hypertension: Blood pressure elevated, will reinitiate Entresto  Hyperlipidemia: On Lipitor  DM2: Managed by primary care provider  PAF: On Eliquis.           Medication Adjustments/Labs and Tests Ordered: Current medicines are reviewed at length with the patient today.  Concerns regarding medicines are outlined above.  Orders Placed This Encounter  Procedures   Basic metabolic panel   Meds ordered this encounter  Medications   sacubitril-valsartan (ENTRESTO) 24-26 MG    Sig: Take 1 tablet by mouth 2 (two) times daily.    Dispense:  180 tablet    Refill:  3    Patient Instructions  Medication Instructions:   RESTART Entresto 24 mg-26 mg-1 tablet by mouth 2 times a day   *If you need a refill on your cardiac medications before your next appointment, please call your pharmacy*  Lab Work: Your physician recommends that you return for lab work in 1 week:  BMP  If you have labs (blood work) drawn today and your tests are completely normal, you will receive your results only by: [place] (if you have MyChart) OR A paper copy in the mail If you have any lab test that is abnormal or we need to change your treatment, we will call you to review the results.  Testing/Procedures: NONE ordered at this time of appointment   Follow-Up: At [facility], you and your health needs are our priority.  As part of our continuing mission to provide you with exceptional heart care, we have created designated Provider Care Teams.  These Care Teams include your primary Cardiologist (physician) and Advanced Practice Providers (APPs -  Physician Assistants and Nurse Practitioners) who all work together to provide you with the care you need, when you need it.  Your next appointment:   6 month(s)  Provider:   Pixie Casino, MD     Other Instructions     Signed, Almyra Deforest, PA  11/29/2022 10:23 PM    [place]

## 2022-11-27 NOTE — Telephone Encounter (Signed)
Attempted to reach patient about initial call regarding concerns with Amiodarone, but no answer. Left additional message to call office back to discuss further.

## 2022-11-29 ENCOUNTER — Encounter: Payer: Self-pay | Admitting: Physician Assistant

## 2022-12-08 ENCOUNTER — Ambulatory Visit: Payer: No Typology Code available for payment source

## 2022-12-08 DIAGNOSIS — I255 Ischemic cardiomyopathy: Secondary | ICD-10-CM | POA: Diagnosis not present

## 2022-12-08 LAB — CUP PACEART REMOTE DEVICE CHECK
Battery Remaining Longevity: 51 mo
Battery Voltage: 2.97 V
Brady Statistic RV Percent Paced: 0.01 %
Date Time Interrogation Session: 20240315012503
HighPow Impedance: 56 Ohm
Implantable Lead Connection Status: 753985
Implantable Lead Implant Date: 20180116
Implantable Lead Location: 753860
Implantable Lead Model: 6935
Implantable Pulse Generator Implant Date: 20180116
Lead Channel Impedance Value: 304 Ohm
Lead Channel Impedance Value: 399 Ohm
Lead Channel Pacing Threshold Amplitude: 0.75 V
Lead Channel Pacing Threshold Pulse Width: 0.4 ms
Lead Channel Sensing Intrinsic Amplitude: 9.5 mV
Lead Channel Sensing Intrinsic Amplitude: 9.5 mV
Lead Channel Setting Pacing Amplitude: 2.5 V
Lead Channel Setting Pacing Pulse Width: 0.4 ms
Lead Channel Setting Sensing Sensitivity: 0.3 mV
Zone Setting Status: 755011
Zone Setting Status: 755011

## 2022-12-25 ENCOUNTER — Ambulatory Visit: Payer: No Typology Code available for payment source | Attending: Cardiology

## 2022-12-25 DIAGNOSIS — I5022 Chronic systolic (congestive) heart failure: Secondary | ICD-10-CM

## 2022-12-25 DIAGNOSIS — Z95810 Presence of automatic (implantable) cardiac defibrillator: Secondary | ICD-10-CM | POA: Diagnosis not present

## 2022-12-27 ENCOUNTER — Telehealth: Payer: Self-pay

## 2022-12-27 NOTE — Telephone Encounter (Signed)
Remote ICM transmission received.  Attempted call to patient regarding ICM remote transmission and left detailed message per DPR.  Advised to return call for any fluid symptoms or questions. Next ICM remote transmission scheduled 01/29/2023.

## 2022-12-27 NOTE — Progress Notes (Signed)
EPIC Encounter for ICM Monitoring  Patient Name: Dean Aswad Sr. is a 72 y.o. male Date: 12/27/2022 Primary Care Physican: [name], MD Primary Cardiologist: [facility] Electrophysiologist: Curt Bears 08/28/2022 Weight: 186 lbs 10/24/2022 Weight: 183 lbs 10/30/2022 Weight:   183 lbs    Since 07-Nov-2022 Time in AF   0.0 hr/day (0.0%)(taking Eliquis)   Attempted call to patient and unable to reach.  Left detailed message per DPR regarding transmission. Transmission reviewed.    Diet: Typically does not limit salt intake.  Eats restaurant foods 2-3 times a week.   Optivol Thoracic impedance suggesting possible fluid accumulation starting 2/21 and returning to baseline 4/1.     Prescribed:  Furosemide 40 mg Take 1 tablet (40 mg total) by mouth daily. Per Dr Debara Pickett patient can take an extra 40 mg lasix as needed up to 3 days if weight goes up or he develops swelling.  Spironolactone 25 mg take 1 tablet by mouth daily Eliquis 5 mg take 1 tablet twice a day   Labs: 11/07/2022 Creatinine 1.78, BUN 42, Potassium 4.8, Sodium 142, GFR 40, BNP 2,422 09/16/2023 Creatinine 1.97, BUN 37, Potassium 4.2, Sodium 137 09/06/2022 Creatinine 2.90, BUN 49, Potassium 4.6, Sodium 135 08/26/2022 Creatinine 2.23, BUN 49, potassium 4.7, Sodium 134, GFR 31 12/10/2020 Creatinine 1.94, BUN 18, Potassium 4.3, Sodium 141, GFR 39 11/02/2020 Creatinine 1.38, BUN 16, Potassium 4.1, Sodium 141, GFR 51-59 A complete set of results can be found in Results Review.   Recommendations:  Left voice mail with ICM number and encouraged to call if experiencing any fluid symptoms.   Follow-up plan: ICM clinic phone appointment on 01/29/2023.   91 day device clinic remote transmission 03/09/2023.     EP/Cardiology Office Visits:   06/08/2023 with Dr Debara Pickett.  Recall 01/26/2023 with Dr Curt Bears.    Copy of ICM check sent to Dr. Curt Bears.   3 month ICM trend: 12/25/2022.    12-14 Month ICM trend:     Rosalene Billings,  RN 12/27/2022 10:48 AM

## 2023-01-08 NOTE — Progress Notes (Signed)
Remote ICD transmission.   

## 2023-01-09 ENCOUNTER — Telehealth: Payer: Self-pay

## 2023-01-09 NOTE — Telephone Encounter (Signed)
Spoke with patient and reviewed remote transmission results and advised fluid levels appear normal.  He reports PCP is going to call in prescription for cough and glad that the cough is not caused by fluid.   Advised to follow PCP recommendations and to call him back if any further changes.   01/09/2023 Optivol thoracic impedance suggesting normal fluid levels 4/1.

## 2023-01-09 NOTE — Telephone Encounter (Signed)
Returned patient call as requested voice mail message.  He reports he has a dry hacking cough for past 6 weeks.  He has discussed with PCP and physician thought it could be related to allergies.  He is does not have any fluid symptoms at this time.  He is unsure if the cough could be related to fluid.  Advised to send manual remote transmission to review fluid levels and will call back with results.

## 2023-01-29 ENCOUNTER — Ambulatory Visit: Payer: No Typology Code available for payment source | Attending: Cardiology

## 2023-01-29 DIAGNOSIS — Z95810 Presence of automatic (implantable) cardiac defibrillator: Secondary | ICD-10-CM

## 2023-01-29 DIAGNOSIS — I5022 Chronic systolic (congestive) heart failure: Secondary | ICD-10-CM

## 2023-01-31 NOTE — Progress Notes (Signed)
EPIC Encounter for ICM Monitoring  Patient Name: Dean Curcio Sr. is a 72 y.o. male Date: 01/31/2023 Primary Care Physican: Ralene Ok, MD Primary Cardiologist: [facility] Electrophysiologist: Elberta Fortis 01/31/2023 Weight:   183 lbs    Since 09-Jan-2023 Time in AF   0.1 hr/day (0.6%)(taking Eliquis)   Spoke with patient and heart failure questions reviewed.  Transmission results reviewed.  Pt asymptomatic for fluid accumulation.  Reports feeling well at this time and voices no complaints.     Diet: Typically does not limit salt intake.  Eats restaurant foods 2-3 times a week.   Optivol Thoracic impedance suggesting normal fluid levels since 4/7.     Prescribed:  Furosemide 40 mg Take 1 tablet (40 mg total) by mouth daily. Per Dr Rennis Golden patient can take an extra 40 mg lasix as needed up to 3 days if weight goes up or he develops swelling.  Spironolactone 25 mg take 1 tablet by mouth daily Eliquis 5 mg take 1 tablet twice a day   Labs: 11/07/2022 Creatinine 1.78, BUN 42, Potassium 4.8, Sodium 142, GFR 40, BNP 2,422 09/16/2023 Creatinine 1.97, BUN 37, Potassium 4.2, Sodium 137 09/06/2022 Creatinine 2.90, BUN 49, Potassium 4.6, Sodium 135 08/26/2022 Creatinine 2.23, BUN 49, potassium 4.7, Sodium 134, GFR 31 12/10/2020 Creatinine 1.94, BUN 18, Potassium 4.3, Sodium 141, GFR 39 11/02/2020 Creatinine 1.38, BUN 16, Potassium 4.1, Sodium 141, GFR 51-59 A complete set of results can be found in Results Review.   Recommendations:  No changes and encouraged to call if experiencing any fluid symptoms.   Follow-up plan: ICM clinic phone appointment on 03/05/2023.   91 day device clinic remote transmission 03/09/2023.     EP/Cardiology Office Visits:   06/08/2023 with Dr Rennis Golden.  Recall 05/06/2023 with Dr Elberta Fortis.    Copy of ICM check sent to Dr. Elberta Fortis.   3 month ICM trend: 01/29/2023.    12-14 Month ICM trend:     Karie Soda, RN 01/31/2023 10:53 AM

## 2023-03-05 ENCOUNTER — Ambulatory Visit: Payer: No Typology Code available for payment source | Attending: Cardiology

## 2023-03-05 DIAGNOSIS — I5022 Chronic systolic (congestive) heart failure: Secondary | ICD-10-CM

## 2023-03-05 DIAGNOSIS — Z95810 Presence of automatic (implantable) cardiac defibrillator: Secondary | ICD-10-CM | POA: Diagnosis not present

## 2023-03-06 ENCOUNTER — Telehealth: Payer: Self-pay

## 2023-03-06 NOTE — Progress Notes (Signed)
EPIC Encounter for ICM Monitoring  Patient Name: Dean Tallman Sr. is a 72 y.o. male Date: 03/06/2023 Primary Care Physican: Ralene Ok, MD Primary Cardiologist: [facility] Electrophysiologist: Elberta Fortis 01/31/2023 Weight:   183 lbs    Since 29-Jan-2023 Time in AF   0.0 hr/day (0.0%)(taking Eliquis)   Attempted call to patient and unable to reach.  Left detailed message per DPR regarding transmission. Transmission reviewed.      Diet: Typically does not limit salt intake.  Eats restaurant foods 2-3 times a week.   Optivol Thoracic impedance suggesting normal fluid levels.     Prescribed:  Furosemide 40 mg Take 1 tablet (40 mg total) by mouth daily. Per Dr Rennis Golden patient can take an extra 40 mg lasix as needed up to 3 days if weight goes up or he develops swelling.  Spironolactone 25 mg take 1 tablet by mouth daily Eliquis 5 mg take 1 tablet twice a day   Labs: 11/07/2022 Creatinine 1.78, BUN 42, Potassium 4.8, Sodium 142, GFR 40, BNP 2,422 09/16/2023 Creatinine 1.97, BUN 37, Potassium 4.2, Sodium 137 09/06/2022 Creatinine 2.90, BUN 49, Potassium 4.6, Sodium 135 08/26/2022 Creatinine 2.23, BUN 49, potassium 4.7, Sodium 134, GFR 31 12/10/2020 Creatinine 1.94, BUN 18, Potassium 4.3, Sodium 141, GFR 39 11/02/2020 Creatinine 1.38, BUN 16, Potassium 4.1, Sodium 141, GFR 51-59 A complete set of results can be found in Results Review.   Recommendations:  Left voice mail with ICM number and encouraged to call if experiencing any fluid symptoms.   Follow-up plan: ICM clinic phone appointment on 04/09/2023.   91 day device clinic remote transmission 03/09/2023.     EP/Cardiology Office Visits:   06/08/2023 with Dr Rennis Golden.  Recall 05/06/2023 with Dr Elberta Fortis.    Copy of ICM check sent to Dr. Elberta Fortis.   3 month ICM trend: 03/05/2023.    12-14 Month ICM trend:     Karie Soda, RN 03/06/2023 3:56 PM

## 2023-03-06 NOTE — Telephone Encounter (Signed)
Remote ICM transmission received.  Attempted call to patient regarding ICM remote transmission and left detailed message per DPR.  Left ICM phone number and advised to return call for any fluid symptoms or questions. Next ICM remote transmission scheduled 04/09/2023.

## 2023-03-09 ENCOUNTER — Ambulatory Visit: Payer: Self-pay

## 2023-03-19 ENCOUNTER — Telehealth: Payer: Self-pay | Admitting: Cardiology

## 2023-03-19 ENCOUNTER — Telehealth: Payer: Self-pay

## 2023-03-19 NOTE — Telephone Encounter (Signed)
Patient calling to speak with a nurse. He says he has been having a cough. He says he was told it may be allergies, but he's not sure. He says he is not sure what to do about it. He says he feels a scratchy feeling in his throat. He says he is not having any other symptom. He is also calling in regards to his recent lab work. He says he got the report back from his PCP that his creatinine did go up.

## 2023-03-19 NOTE — Telephone Encounter (Signed)
Received call from patient and he wants to set up appointment with Dr Rennis Golden and PCP advised him the latest Creatinine was increased and concerned about his kidneys.  Also asked if he should continue to take [facility].  Advised to call Dr [facility] office for appointment and question about taking Entresto.  Weight is 182 lbs.  He reported his daughter passed away.

## 2023-03-20 NOTE — Telephone Encounter (Signed)
Attempted to reach pt, unable to leave message, voicemail full 

## 2023-03-22 NOTE — Telephone Encounter (Signed)
Attempted to reach pt again, voicemail full.   Unable to leave message

## 2023-04-09 ENCOUNTER — Ambulatory Visit: Payer: No Typology Code available for payment source | Attending: Cardiology

## 2023-04-09 DIAGNOSIS — I5022 Chronic systolic (congestive) heart failure: Secondary | ICD-10-CM | POA: Diagnosis not present

## 2023-04-09 DIAGNOSIS — Z95810 Presence of automatic (implantable) cardiac defibrillator: Secondary | ICD-10-CM

## 2023-04-10 NOTE — Progress Notes (Signed)
EPIC Encounter for ICM Monitoring  Patient Name: Dean Lall Sr. is a 72 y.o. male Date: 04/10/2023 Primary Care Physican: Ralene Ok, MD Primary Cardiologist: [facility] Electrophysiologist: Elberta Fortis 01/31/2023 Weight:   183 lbs    Since 29-Jan-2023 Time in AF   0.0 hr/day (0.0%)(taking Eliquis)   Attempted call to patient and unable to reach.  Transmission reviewed.      Diet: Typically does not limit salt intake.  Eats restaurant foods 2-3 times a week.   Optivol Thoracic impedance suggesting possible fluid accumulation starting 6/13 and trending back to baseline.     Prescribed:  Furosemide 40 mg Take 1 tablet (40 mg total) by mouth daily. Per Dr Rennis Golden patient can take an extra 40 mg lasix as needed up to 3 days if weight goes up or he develops swelling.  Spironolactone 25 mg take 1 tablet by mouth daily Eliquis 5 mg take 1 tablet twice a day   Labs: 11/07/2022 Creatinine 1.78, BUN 42, Potassium 4.8, Sodium 142, GFR 40, BNP 2,422 09/16/2023 Creatinine 1.97, BUN 37, Potassium 4.2, Sodium 137 09/06/2022 Creatinine 2.90, BUN 49, Potassium 4.6, Sodium 135 08/26/2022 Creatinine 2.23, BUN 49, potassium 4.7, Sodium 134, GFR 31 12/10/2020 Creatinine 1.94, BUN 18, Potassium 4.3, Sodium 141, GFR 39 11/02/2020 Creatinine 1.38, BUN 16, Potassium 4.1, Sodium 141, GFR 51-59 A complete set of results can be found in Results Review.   Recommendations:  Unable to reach.     Follow-up plan: ICM clinic phone appointment on 04/23/2023 to recheck fluid levels.   91 day device clinic remote transmission 06/08/2023.     EP/Cardiology Office Visits:   06/08/2023 with Dr Rennis Golden.  05/07/2023 with Francis Dowse, PA.    Copy of ICM check sent to Dr. Elberta Fortis.  Will send to Dr Rennis Golden for review if patient is reached.   3 month ICM trend: 04/09/2023.    12-14 Month ICM trend:     Karie Soda, RN 04/10/2023 1:08 PM

## 2023-04-20 ENCOUNTER — Other Ambulatory Visit: Payer: Self-pay | Admitting: Cardiology

## 2023-04-20 NOTE — Telephone Encounter (Signed)
Prescription refill request for Eliquis received. Indication:afib Last office visit:3/24 Scr:1.78  2/24 Age: 72 Weight:84.7  kg  Prescription refilled

## 2023-04-25 NOTE — Progress Notes (Signed)
No ICM remote transmission received for 04/23/2023 and next ICM transmission scheduled for 05/14/2023.
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
## 2023-05-07 ENCOUNTER — Ambulatory Visit: Payer: No Typology Code available for payment source | Attending: Physician Assistant | Admitting: Physician Assistant

## 2023-05-07 VITALS — BP 146/50 | HR 68 | Ht 66.0 in | Wt 190.0 lb

## 2023-05-07 DIAGNOSIS — I255 Ischemic cardiomyopathy: Secondary | ICD-10-CM

## 2023-05-07 DIAGNOSIS — Z79899 Other long term (current) drug therapy: Secondary | ICD-10-CM | POA: Diagnosis not present

## 2023-05-07 DIAGNOSIS — I480 Paroxysmal atrial fibrillation: Secondary | ICD-10-CM | POA: Diagnosis not present

## 2023-05-07 DIAGNOSIS — I2510 Atherosclerotic heart disease of native coronary artery without angina pectoris: Secondary | ICD-10-CM

## 2023-05-07 DIAGNOSIS — I5022 Chronic systolic (congestive) heart failure: Secondary | ICD-10-CM

## 2023-05-07 DIAGNOSIS — I472 Ventricular tachycardia, unspecified: Secondary | ICD-10-CM | POA: Diagnosis not present

## 2023-05-07 DIAGNOSIS — Z95810 Presence of automatic (implantable) cardiac defibrillator: Secondary | ICD-10-CM | POA: Diagnosis not present

## 2023-05-07 LAB — CBC

## 2023-05-07 LAB — CUP PACEART INCLINIC DEVICE CHECK
Battery Remaining Longevity: 43 mo
Battery Voltage: 2.97 V
Brady Statistic RV Percent Paced: 0.03 %
Date Time Interrogation Session: 20240812131923
HighPow Impedance: 57 Ohm
Implantable Lead Connection Status: 753985
Implantable Lead Implant Date: 20180116
Implantable Lead Location: 753860
Implantable Lead Model: 6935
Implantable Pulse Generator Implant Date: 20180116
Lead Channel Impedance Value: 304 Ohm
Lead Channel Impedance Value: 342 Ohm
Lead Channel Pacing Threshold Amplitude: 0.625 V
Lead Channel Pacing Threshold Pulse Width: 0.4 ms
Lead Channel Sensing Intrinsic Amplitude: 11.375 mV
Lead Channel Sensing Intrinsic Amplitude: 8.25 mV
Lead Channel Setting Pacing Amplitude: 2.5 V
Lead Channel Setting Pacing Pulse Width: 0.4 ms
Lead Channel Setting Sensing Sensitivity: 0.3 mV
Zone Setting Status: 755011
Zone Setting Status: 755011

## 2023-05-07 LAB — COMPREHENSIVE METABOLIC PANEL
ALT: 9 IU/L (ref 0–44)
AST: 16 IU/L (ref 0–40)
Albumin: 4.4 g/dL (ref 3.8–4.8)
Alkaline Phosphatase: 96 IU/L (ref 44–121)
BUN/Creatinine Ratio: 24 (ref 10–24)
BUN: 54 mg/dL — ABNORMAL HIGH (ref 8–27)
Bilirubin Total: 0.5 mg/dL (ref 0.0–1.2)
CO2: 19 mmol/L — ABNORMAL LOW (ref 20–29)
Calcium: 10.8 mg/dL — ABNORMAL HIGH (ref 8.6–10.2)
Chloride: 107 mmol/L — ABNORMAL HIGH (ref 96–106)
Creatinine, Ser: 2.29 mg/dL — ABNORMAL HIGH (ref 0.76–1.27)
Globulin, Total: 2.8 g/dL (ref 1.5–4.5)
Glucose: 100 mg/dL — ABNORMAL HIGH (ref 70–99)
Potassium: 5.4 mmol/L — ABNORMAL HIGH (ref 3.5–5.2)
Sodium: 137 mmol/L (ref 134–144)
Total Protein: 7.2 g/dL (ref 6.0–8.5)
eGFR: 29 mL/min/{1.73_m2} — ABNORMAL LOW (ref >59)

## 2023-05-07 LAB — TSH: TSH: 5.3 u[IU]/mL — ABNORMAL HIGH (ref 0.450–4.500)

## 2023-05-07 LAB — MAGNESIUM: Magnesium: 1.9 mg/dL (ref 1.6–2.3)

## 2023-05-07 MED ORDER — EZETIMIBE 10 MG PO TABS: 10.0000 mg | ORAL_TABLET | Freq: Every day | ORAL | 2 refills | Status: DC

## 2023-05-07 NOTE — Patient Instructions (Signed)
Medication Instructions:   Your physician recommends that you continue on your current medications as directed. Please refer to the Current Medication list given to you today.   *If you need a refill on your cardiac medications before your next appointment, please call your pharmacy*   Lab Work: CMET MAG CBC AND TSH    If you have labs (blood work) drawn today and your tests are completely normal, you will receive your results only by: MyChart Message (if you have MyChart) OR A paper copy in the mail If you have any lab test that is abnormal or we need to change your treatment, we will call you to review the results.   Testing/Procedures: NONE ORDERED  TODAY   Follow-Up: At [facility], you and your health needs are our priority.  As part of our continuing mission to provide you with exceptional heart care, we have created designated Provider Care Teams.  These Care Teams include your primary Cardiologist (physician) and Advanced Practice Providers (APPs -  Physician Assistants and Nurse Practitioners) who all work together to provide you with the care you need, when you need it.  We recommend signing up for the patient portal called "MyChart".  Sign up information is provided on this After Visit Summary.  MyChart is used to connect with patients for Virtual Visits (Telemedicine).  Patients are able to view lab/test results, encounter notes, upcoming appointments, etc.  Non-urgent messages can be sent to your provider as well.   To learn more about what you can do with MyChart, go to [email or web address].    Your next appointment:   1 year(s)  Provider:   You may see Will Jorja Loa, MD or one of the following Advanced Practice Providers on your designated Care Team:   Francis Dowse, [place] Casimiro Needle "Mardelle Matte" Lanna Poche, [place]  Other Instructions

## 2023-05-08 ENCOUNTER — Telehealth: Payer: Self-pay | Admitting: *Deleted

## 2023-05-08 ENCOUNTER — Other Ambulatory Visit: Payer: Self-pay | Admitting: *Deleted

## 2023-05-08 DIAGNOSIS — Z79899 Other long term (current) drug therapy: Secondary | ICD-10-CM

## 2023-05-08 MED ORDER — SPIRONOLACTONE 25 MG PO TABS: 12.5000 mg | ORAL_TABLET | Freq: Every day | ORAL | Status: DC

## 2023-05-08 NOTE — Telephone Encounter (Signed)
Lvm on preferred number to call back direct number [phone]  for follow up on lab results and recommendations.

## 2023-05-09 NOTE — Telephone Encounter (Signed)
Spoke with patient and aware of results and verbalized understanding. Patient would like to get labs drawn at Dr [facility] Appointment on 9-13

## 2023-05-09 NOTE — Addendum Note (Signed)
Addended by: Oleta Mouse on: 05/09/2023 10:03 AM   Modules accepted: Orders

## 2023-05-14 ENCOUNTER — Ambulatory Visit: Payer: No Typology Code available for payment source | Attending: Cardiology

## 2023-05-14 DIAGNOSIS — I5022 Chronic systolic (congestive) heart failure: Secondary | ICD-10-CM

## 2023-05-14 DIAGNOSIS — Z95810 Presence of automatic (implantable) cardiac defibrillator: Secondary | ICD-10-CM | POA: Diagnosis not present

## 2023-05-17 ENCOUNTER — Ambulatory Visit (INDEPENDENT_AMBULATORY_CARE_PROVIDER_SITE_OTHER): Payer: No Typology Code available for payment source

## 2023-05-17 DIAGNOSIS — I255 Ischemic cardiomyopathy: Secondary | ICD-10-CM | POA: Diagnosis not present

## 2023-05-17 LAB — CUP PACEART REMOTE DEVICE CHECK
Date Time Interrogation Session: 20240822071039
Implantable Lead Connection Status: 753985
Implantable Lead Implant Date: 20180116
Implantable Lead Location: 753860
Implantable Lead Model: 6935
Implantable Pulse Generator Implant Date: 20180116

## 2023-05-17 NOTE — Progress Notes (Signed)
EPIC Encounter for ICM Monitoring  Patient Name: Dean Davtyan Sr. is a 72 y.o. male Date: 05/17/2023 Primary Care Physican: Ralene Ok, MD Primary Cardiologist: [facility] Electrophysiologist: Elberta Fortis 01/31/2023 Weight:   183 lbs  Since 07-May-2023 Time in AF   0.0 hr/day (0.0%)(taking Eliquis)   Transmission reviewed.      Diet: Typically does not limit salt intake.  Eats restaurant foods 2-3 times a week.   Optivol Thoracic impedance suggesting normal fluid levels since 8/11.     Prescribed:  Furosemide 40 mg Take 1 tablet (40 mg total) by mouth daily. Per Dr Rennis Golden patient can take an extra 40 mg lasix as needed up to 3 days if weight goes up or he develops swelling.  Spironolactone 25 mg take 1 tablet by mouth daily Eliquis 5 mg take 1 tablet twice a day   Labs: 11/07/2022 Creatinine 1.78, BUN 42, Potassium 4.8, Sodium 142, GFR 40, BNP 2,422 09/16/2023 Creatinine 1.97, BUN 37, Potassium 4.2, Sodium 137 A complete set of results can be found in Results Review.   Recommendations:  No changes.    Follow-up plan: ICM clinic phone appointment on 06/18/2023.   91 day device clinic remote transmission 08/16/2023.     EP/Cardiology Office Visits:   06/08/2023 with Dr Rennis Golden.   Recall 05/01/2024 with Dr Elberta Fortis.   Copy of ICM check sent to Dr. Elberta Fortis.   3 month ICM trend: 05/16/2023.    12-14 Month ICM trend:     Karie Soda, RN 05/17/2023 7:17 AM

## 2023-05-24 ENCOUNTER — Other Ambulatory Visit: Payer: Self-pay | Admitting: Internal Medicine

## 2023-05-25 NOTE — Progress Notes (Signed)
Remote ICD transmission.   

## 2023-06-08 ENCOUNTER — Ambulatory Visit: Payer: Self-pay

## 2023-06-08 ENCOUNTER — Ambulatory Visit: Payer: No Typology Code available for payment source | Admitting: Internal Medicine

## 2023-06-18 ENCOUNTER — Ambulatory Visit: Payer: No Typology Code available for payment source | Attending: Cardiology

## 2023-06-18 DIAGNOSIS — I5022 Chronic systolic (congestive) heart failure: Secondary | ICD-10-CM

## 2023-06-18 DIAGNOSIS — Z95810 Presence of automatic (implantable) cardiac defibrillator: Secondary | ICD-10-CM

## 2023-06-19 ENCOUNTER — Other Ambulatory Visit: Payer: Self-pay | Admitting: Nephrology

## 2023-06-19 DIAGNOSIS — I480 Paroxysmal atrial fibrillation: Secondary | ICD-10-CM

## 2023-06-19 DIAGNOSIS — E872 Acidosis, unspecified: Secondary | ICD-10-CM

## 2023-06-19 DIAGNOSIS — N1832 Chronic kidney disease, stage 3b: Secondary | ICD-10-CM

## 2023-06-19 DIAGNOSIS — N189 Chronic kidney disease, unspecified: Secondary | ICD-10-CM

## 2023-06-19 DIAGNOSIS — I5042 Chronic combined systolic (congestive) and diastolic (congestive) heart failure: Secondary | ICD-10-CM

## 2023-06-19 DIAGNOSIS — E875 Hyperkalemia: Secondary | ICD-10-CM

## 2023-06-21 NOTE — Progress Notes (Signed)
EPIC Encounter for ICM Monitoring  Patient Name: Dean Sofield Sr. is a 72 y.o. male Date: 06/21/2023 Primary Care Physican: Ralene Ok, MD Primary Cardiologist: [facility] Electrophysiologist: Elberta Fortis 01/31/2023 Weight:   183 lbs 06/21/2023 Weight: 183 lbs   Since 07-May-2023 Time in AF   0.0 hr/day (0.0%)(taking Eliquis)   Spoke with patient and heart failure questions reviewed.  Transmission results reviewed.  Pt asymptomatic for fluid accumulation.  Reports feeling well at this time and voices no complaints.     Diet: Typically does not limit salt intake.  Eats restaurant foods 2-3 times a week.   Optivol Thoracic impedance suggesting normal fluid levels since 8/11.     Prescribed:  Furosemide 40 mg Take 1 tablet (40 mg total) by mouth daily. Per Dr Rennis Golden patient can take an extra 40 mg lasix as needed up to 3 days if weight goes up or he develops swelling.  Pt reports 9/26 he is taking Lasix 60 mg  Spironolactone 25 mg take 1 tablet by mouth daily Eliquis 5 mg take 1 tablet twice a day   Labs: 11/07/2022 Creatinine 1.78, BUN 42, Potassium 4.8, Sodium 142, GFR 40, BNP 2,422 09/15/2022 Creatinine 1.97, BUN 37, Potassium 4.2, Sodium 137 A complete set of results can be found in Results Review.   Recommendations:  No changes and encouraged to call if experiencing any fluid symptoms.   Follow-up plan: ICM clinic phone appointment on 07/23/2023.   91 day device clinic remote transmission 08/16/2023.     EP/Cardiology Office Visits:  Missed 06/08/2023 with Dr Rennis Golden.   07/19/2023 with Francis Dowse, PA.   Copy of ICM check sent to Dr. Elberta Fortis.    3 month ICM trend: 06/18/2023.    12-14 Month ICM trend:     Karie Soda, RN 06/21/2023 7:46 AM

## 2023-06-28 ENCOUNTER — Ambulatory Visit
Admission: RE | Admit: 2023-06-28 | Discharge: 2023-06-28 | Disposition: A | Discharge status: Home / Self Care | Payer: No Typology Code available for payment source | Source: Ambulatory Visit | Attending: Nephrology | Admitting: Nephrology

## 2023-06-28 DIAGNOSIS — E875 Hyperkalemia: Secondary | ICD-10-CM

## 2023-06-28 DIAGNOSIS — N1832 Chronic kidney disease, stage 3b: Secondary | ICD-10-CM

## 2023-06-28 DIAGNOSIS — D631 Anemia in chronic kidney disease: Secondary | ICD-10-CM

## 2023-06-28 DIAGNOSIS — I480 Paroxysmal atrial fibrillation: Secondary | ICD-10-CM

## 2023-06-28 DIAGNOSIS — E872 Acidosis, unspecified: Secondary | ICD-10-CM

## 2023-06-28 DIAGNOSIS — I5042 Chronic combined systolic (congestive) and diastolic (congestive) heart failure: Secondary | ICD-10-CM

## 2023-07-06 LAB — LAB REPORT - SCANNED
A1c: 5.6
EGFR: 34
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
## 2023-07-19 ENCOUNTER — Encounter: Payer: Self-pay | Admitting: Physician Assistant

## 2023-07-19 ENCOUNTER — Ambulatory Visit: Payer: No Typology Code available for payment source | Attending: Internal Medicine | Admitting: Physician Assistant

## 2023-07-19 VITALS — BP 144/62 | HR 82 | Ht 66.0 in | Wt 191.8 lb

## 2023-07-19 DIAGNOSIS — Z95810 Presence of automatic (implantable) cardiac defibrillator: Secondary | ICD-10-CM

## 2023-07-19 DIAGNOSIS — I5022 Chronic systolic (congestive) heart failure: Secondary | ICD-10-CM

## 2023-07-19 DIAGNOSIS — I255 Ischemic cardiomyopathy: Secondary | ICD-10-CM | POA: Diagnosis not present

## 2023-07-19 DIAGNOSIS — I2510 Atherosclerotic heart disease of native coronary artery without angina pectoris: Secondary | ICD-10-CM

## 2023-07-19 DIAGNOSIS — I472 Ventricular tachycardia, unspecified: Secondary | ICD-10-CM

## 2023-07-19 DIAGNOSIS — I480 Paroxysmal atrial fibrillation: Secondary | ICD-10-CM

## 2023-07-19 DIAGNOSIS — D6869 Other thrombophilia: Secondary | ICD-10-CM

## 2023-07-19 LAB — CUP PACEART INCLINIC DEVICE CHECK
Battery Remaining Longevity: 42 mo
Battery Voltage: 2.97 V
Brady Statistic RV Percent Paced: 0.06 %
Date Time Interrogation Session: 20241024122350
HighPow Impedance: 55 Ohm
Implantable Lead Connection Status: 753985
Implantable Lead Implant Date: 20180116
Implantable Lead Location: 753860
Implantable Lead Model: 6935
Implantable Pulse Generator Implant Date: 20180116
Lead Channel Impedance Value: 304 Ohm
Lead Channel Impedance Value: 380 Ohm
Lead Channel Pacing Threshold Amplitude: 0.75 V
Lead Channel Pacing Threshold Pulse Width: 0.4 ms
Lead Channel Sensing Intrinsic Amplitude: 17.125 mV
Lead Channel Sensing Intrinsic Amplitude: 8.25 mV
Lead Channel Setting Pacing Amplitude: 2.5 V
Lead Channel Setting Pacing Pulse Width: 0.4 ms
Lead Channel Setting Sensing Sensitivity: 0.3 mV
Zone Setting Status: 755011
Zone Setting Status: 755011

## 2023-07-19 LAB — T3, FREE: T3, Free: 1.8 pg/mL — ABNORMAL LOW (ref 2.0–4.4)

## 2023-07-19 LAB — TSH: TSH: 3.55 u[IU]/mL (ref 0.450–4.500)

## 2023-07-19 LAB — T4, FREE: Free T4: 1.93 ng/dL — ABNORMAL HIGH (ref 0.82–1.77)

## 2023-07-19 NOTE — Patient Instructions (Signed)
Medication Instructions:  Your physician recommends that you continue on your current medications as directed. Please refer to the Current Medication list given to you today.  *If you need a refill on your cardiac medications before your next appointment, please call your pharmacy*   Lab Work: TODAY:  TSH, FREE T3, & FREE T4  If you have labs (blood work) drawn today and your tests are completely normal, you will receive your results only by: MyChart Message (if you have MyChart) OR A paper copy in the mail If you have any lab test that is abnormal or we need to change your treatment, we will call you to review the results.   Testing/Procedures: Your physician recommends that you continue on your current medications as directed. Please refer to the Current Medication list given to you today.    Follow-Up: At [facility], you and your health needs are our priority.  As part of our continuing mission to provide you with exceptional heart care, we have created designated Provider Care Teams.  These Care Teams include your primary Cardiologist (physician) and Advanced Practice Providers (APPs -  Physician Assistants and Nurse Practitioners) who all work together to provide you with the care you need, when you need it.  We recommend signing up for the patient portal called "MyChart".  Sign up information is provided on this After Visit Summary.  MyChart is used to connect with patients for Virtual Visits (Telemedicine).  Patients are able to view lab/test results, encounter notes, upcoming appointments, etc.  Non-urgent messages can be sent to your provider as well.   To learn more about what you can do with MyChart, go to [email or web address].    Your next appointment:   6 month(s)  Provider:   Chrystie Nose, MD  or Edd Fabian, FNP, Micah Flesher, PA-C, Marjie Skiff, PA-C, Robet Leu, PA-C, Juanda Crumble, PA-C, Joni Reining, DNP, ANP, Azalee Course, PA-C, Bernadene Person, NP, Reather Littler, NP, or Carlos Levering, NP        Other Instructions

## 2023-07-23 ENCOUNTER — Ambulatory Visit: Payer: No Typology Code available for payment source | Attending: Cardiology

## 2023-07-23 DIAGNOSIS — Z95810 Presence of automatic (implantable) cardiac defibrillator: Secondary | ICD-10-CM

## 2023-07-23 DIAGNOSIS — I5022 Chronic systolic (congestive) heart failure: Secondary | ICD-10-CM

## 2023-07-24 NOTE — Progress Notes (Signed)
EPIC Encounter for ICM Monitoring  Patient Name: Dean Benitez Sr. is a 72 y.o. male Date: 07/24/2023 Primary Care Physican: Ralene Ok, MD Primary Cardiologist: [facility] Electrophysiologist: Elberta Fortis Nephrologist:  [facility] 01/31/2023 Weight:   183 lbs 06/21/2023 Weight: 183 lbs 07/24/2023 Weight: 183-184 lbs   Since 19-Jul-2023 Time in AF   0.0 hr/day (0.0%)(taking Eliquis)   Spoke with patient and heart failure questions reviewed.  Transmission results reviewed.  Pt asymptomatic for fluid accumulation.  Reports feeling well at this time and voices no complaints.     Diet: Typically does not limit salt intake.  Eats restaurant foods 2-3 times a week.   Optivol Thoracic impedance suggesting possible fluid accumulation starting 10/1.     Prescribed:  Furosemide 40 mg Take 1 tablet (40 mg total) by mouth daily. Per 10/23/2022 ICM note from Dr Rennis Golden he can take an extra 40 mg lasix as needed up to 3 days if weight goes up or he develops swelling.   Spironolactone 25 mg take 1 tablet by mouth daily Eliquis 5 mg take 1 tablet twice a day   Labs: 07/05/2023 Creatinine 2.03, BUN 50, Potassium 4.3, Sodium 139, GFR 34 11/07/2022 Creatinine 1.78, BUN 42, Potassium 4.8, Sodium 142, GFR 40, BNP 2,422 A complete set of results can be found in Results Review.   Recommendations:  Advised to limit salt intake.  He will self adjust Furosemide and take 60 mg x 2 days only and then return to 1 tablet daily.     Follow-up plan: ICM clinic phone appointment on 07/30/2023 (manual) to recheck fluid levels.   91 day device clinic remote transmission 08/16/2023.     EP/Cardiology Office Visits:   Recall 01/15/2024 with Dr Rennis Golden.  Recall 07/08/2024 with Dr Elberta Fortis.   Copy of ICM check sent to Dr. Elberta Fortis and Dr Rennis Golden as Lorain Childes.    3 month ICM trend: 07/23/2023.    12-14 Month ICM trend:     Karie Soda, RN 07/24/2023 10:48 AM

## 2023-07-24 NOTE — Progress Notes (Signed)
  Received: Today Hilty, Lisette Abu, MD  Elizabelle Fite, Josephine Igo, RN Thanks

## 2023-07-30 ENCOUNTER — Telehealth: Payer: Self-pay

## 2023-07-30 NOTE — Telephone Encounter (Signed)
Attempted ICM Call to patient.  Left message requesting to send manual remote transmission to recheck fluid levels.

## 2023-08-01 NOTE — Progress Notes (Signed)
No ICM remote transmission received for 07/30/2023 and next ICM transmission scheduled for 08/27/2023.

## 2023-08-02 ENCOUNTER — Telehealth: Payer: Self-pay

## 2023-08-02 NOTE — Telephone Encounter (Signed)
Appears was a missed a appointment.  Forwarding back to [place] for when she returns.

## 2023-08-02 NOTE — Telephone Encounter (Signed)
Folow Up:      Patient is calling Dean [name] back from yesterday. She is out today.

## 2023-08-16 ENCOUNTER — Ambulatory Visit (INDEPENDENT_AMBULATORY_CARE_PROVIDER_SITE_OTHER): Payer: No Typology Code available for payment source

## 2023-08-16 DIAGNOSIS — I255 Ischemic cardiomyopathy: Secondary | ICD-10-CM | POA: Diagnosis not present

## 2023-08-25 ENCOUNTER — Other Ambulatory Visit: Payer: Self-pay | Admitting: Physician Assistant

## 2023-08-25 ENCOUNTER — Other Ambulatory Visit: Payer: Self-pay | Admitting: Cardiology

## 2023-08-25 DIAGNOSIS — I480 Paroxysmal atrial fibrillation: Secondary | ICD-10-CM

## 2023-08-25 DIAGNOSIS — I10 Essential (primary) hypertension: Secondary | ICD-10-CM

## 2023-08-27 ENCOUNTER — Ambulatory Visit: Payer: No Typology Code available for payment source | Attending: Cardiology

## 2023-08-27 DIAGNOSIS — I5022 Chronic systolic (congestive) heart failure: Secondary | ICD-10-CM

## 2023-08-27 DIAGNOSIS — Z95810 Presence of automatic (implantable) cardiac defibrillator: Secondary | ICD-10-CM

## 2023-08-28 ENCOUNTER — Telehealth: Payer: Self-pay

## 2023-08-28 NOTE — Progress Notes (Addendum)
Spoke with patient and heart failure questions reviewed.  Transmission results reviewed.  Pt reports some SOB at night when lying down.  He is going to take extra 40 mg daily x 2-3 days as recommended by Dr Rennis Golden if he has symptoms. Recommendation to limit salt intake to 2000 mg daily and fluid intake to 64 oz daily.  Encouraged to call if experiencing any fluid symptoms.   Copy sent to Dr Rennis Golden as Lysle Morales and review.  Weight: 187 lbs (baseline 183-184 lbs)

## 2023-08-28 NOTE — Telephone Encounter (Signed)
Patient is returning call and is requesting return cal.

## 2023-08-28 NOTE — Telephone Encounter (Signed)
Remote ICM transmission received.  Attempted call to patient regarding ICM remote transmission and no answer.  

## 2023-08-28 NOTE — Progress Notes (Addendum)
EPIC Encounter for ICM Monitoring  Patient Name: Dean Vanderpoel Sr. is a 72 y.o. male Date: 08/28/2023 Primary Care Physican: Ralene Ok, MD Primary Cardiologist: [facility] Electrophysiologist: Elberta Fortis Nephrologist:  [facility] 01/31/2023 Weight:   183 lbs 06/21/2023 Weight: 183 lbs 07/24/2023 Weight: 183-184 lbs   Since 21-Aug-2023 Time in AF   0.0 hr/day (0.0%)(taking Eliquis)   Attempted call to patient and unable to reach.   Transmission reviewed.    Diet: Typically does not limit salt intake.  Eats restaurant foods 2-3 times a week.   Optivol Thoracic impedance suggesting possible fluid accumulation starting 10/1.  Fluid index greater than normal threshold starting 10/24   Prescribed:  Furosemide 40 mg Take 1 tablet (40 mg total) by mouth daily. Per 10/23/2022 ICM note from Dr Rennis Golden he can take an extra 40 mg lasix as needed up to 3 days if weight goes up or he develops swelling.   Spironolactone 25 mg take 1 tablet by mouth daily Eliquis 5 mg take 1 tablet twice a day   Labs: 07/05/2023 Creatinine 2.03, BUN 50, Potassium 4.3, Sodium 139, GFR 34 11/07/2022 Creatinine 1.78, BUN 42, Potassium 4.8, Sodium 142, GFR 40, BNP 2,422 A complete set of results can be found in Results Review.   Recommendations:  Unable to reach.      Follow-up plan: ICM clinic phone appointment on 09/03/2023 to recheck fluid levels.   91 day device clinic remote transmission 11/15/2023.     EP/Cardiology Office Visits:   Recall 01/15/2024 with Dr Rennis Golden.  Recall 07/08/2024 with Dr Elberta Fortis.   Copy of ICM check sent to Dr. Elberta Fortis and will send to Dr Rennis Golden if patient is reached.  3 month ICM trend: 08/27/2023.    12-14 Month ICM trend:     Karie Soda, RN 08/28/2023 12:57 PM

## 2023-08-29 NOTE — Progress Notes (Signed)
Message Received: Today Hilty, Lisette Abu, MD  Darcell Sabino, Josephine Igo, RN Ok thanks

## 2023-08-29 NOTE — Telephone Encounter (Signed)
See ICM note for return call

## 2023-09-03 ENCOUNTER — Ambulatory Visit: Payer: No Typology Code available for payment source | Attending: Cardiology

## 2023-09-03 DIAGNOSIS — I5022 Chronic systolic (congestive) heart failure: Secondary | ICD-10-CM

## 2023-09-03 DIAGNOSIS — Z95810 Presence of automatic (implantable) cardiac defibrillator: Secondary | ICD-10-CM

## 2023-09-03 NOTE — Progress Notes (Signed)
EPIC Encounter for ICM Monitoring  Patient Name: Dean Fougerousse Sr. is a 72 y.o. male Date: 09/03/2023 Primary Care Physican: Ralene Ok, MD Primary Cardiologist: [facility] Electrophysiologist: Elberta Fortis Nephrologist:  [facility] 01/31/2023 Weight:   183 lbs 06/21/2023 Weight: 183 lbs 07/24/2023 Weight: 183-184 lbs   Since 21-Aug-2023 Time in AF   0.0 hr/day (0.0%)(taking Eliquis)   Spoke with patient and heart failure questions reviewed.  Transmission results reviewed.  Pt reports SOB when lying down has resolved after taking extra Lasix.   Diet: Typically does not limit salt intake.  Eats restaurant foods 2-3 times a week.   Optivol Thoracic impedance suggesting fluid levels improved after taking extra 40 mg Lasix x 3 days and trending toward normal.  Fluid index greater than normal threshold starting 10/24   Prescribed:  Furosemide 40 mg Take 1 tablet (40 mg total) by mouth daily. Per 10/23/2022 ICM note from Dr Rennis Golden he can take an extra 40 mg lasix as needed up to 3 days if weight goes up or he develops swelling.   Spironolactone 25 mg take 1 tablet by mouth daily Eliquis 5 mg take 1 tablet twice a day   Labs: 07/05/2023 Creatinine 2.03, BUN 50, Potassium 4.3, Sodium 139, GFR 34 11/07/2022 Creatinine 1.78, BUN 42, Potassium 4.8, Sodium 142, GFR 40, BNP 2,422 A complete set of results can be found in Results Review.   Recommendations:  No changes and encouraged to call if experiencing any fluid symptoms.   Follow-up plan: ICM clinic phone appointment on 10/01/2023.   91 day device clinic remote transmission 11/15/2023.     EP/Cardiology Office Visits:   Recall 01/15/2024 with Dr Rennis Golden.  Recall 07/08/2024 with Dr Elberta Fortis.   Copy of ICM check sent to Dr. Elberta Fortis.  3 month ICM trend: 09/03/2023.    12-14 Month ICM trend:     Karie Soda, RN 09/03/2023 1:57 PM

## 2023-09-06 NOTE — Progress Notes (Signed)
Remote ICD transmission.   

## 2023-09-07 ENCOUNTER — Ambulatory Visit: Payer: Self-pay

## 2023-09-20 ENCOUNTER — Telehealth: Payer: Self-pay | Admitting: *Deleted

## 2023-09-20 ENCOUNTER — Other Ambulatory Visit: Payer: Self-pay | Admitting: Gastroenterology

## 2023-09-20 NOTE — Telephone Encounter (Signed)
   Pre-operative Risk Assessment    Patient Name: Dean [name].  DOB: 01/21/1951 MRN: [id number]  DATE OF LAST VISIT: 07/19/23 Francis Dowse, PAC DATE OF NEXT VISIT: NONE    Request for Surgical Clearance    Procedure:   COLONOSCOPY  Date of Surgery:  Clearance 11/16/23                                 Surgeon:  DR. HUNG Surgeon's Group or Practice Name:  [facility] Phone number:  [phone] Fax number:  [phone]   Type of Clearance Requested:   - Medical  - Pharmacy:  Hold Apixaban (Eliquis)     Type of Anesthesia:   PROPOFOL   Additional requests/questions:    Elpidio Anis   09/20/2023, 4:09 PM

## 2023-09-21 ENCOUNTER — Telehealth: Payer: Self-pay | Admitting: *Deleted

## 2023-09-21 NOTE — Telephone Encounter (Signed)
Pt has been scheduled tele preop appt 10/26/23. Med rec and consent are done.

## 2023-09-21 NOTE — Telephone Encounter (Signed)
Patient with diagnosis of PAF on Eliquis for anticoagulation.    Procedure:  COLONOSCOPY  Date of procedure: 11/16/2023   CHA2DS2-VASc Score = 4   This indicates a 4.8% annual risk of stroke. The patient's score is based upon: CHF History: 1 HTN History: 1 Diabetes History: 0 Stroke History: 0 Vascular Disease History: 1 Age Score: 1 Gender Score: 0     CrCl 40 mL/min Platelet count 151 (06/14/2023)    Per office protocol, patient can hold Eliquis for 1-2 days prior to procedure.     **This guidance is not considered finalized until pre-operative APP has relayed final recommendations.**

## 2023-09-21 NOTE — Telephone Encounter (Signed)
Pt has been scheduled tele preop appt 10/26/23. Med rec and consent are done.      Patient Consent for Virtual Visit        Dean Sahagian Sr. has provided verbal consent on 09/21/2023 for a virtual visit (video or telephone).   CONSENT FOR VIRTUAL VISIT FOR:  Dean Jasper Sr.  By participating in this virtual visit I agree to the following:  I hereby voluntarily request, consent and authorize [facility] and its employed or contracted physicians, physician assistants, nurse practitioners or other licensed health care professionals (the Practitioner), to provide me with telemedicine health care services (the "Services") as deemed necessary by the treating Practitioner. I acknowledge and consent to receive the Services by the Practitioner via telemedicine. I understand that the telemedicine visit will involve communicating with the Practitioner through live audiovisual communication technology and the disclosure of certain medical information by electronic transmission. I acknowledge that I have been given the opportunity to request an in-person assessment or other available alternative prior to the telemedicine visit and am voluntarily participating in the telemedicine visit.  I understand that I have the right to withhold or withdraw my consent to the use of telemedicine in the course of my care at any time, without affecting my right to future care or treatment, and that the Practitioner or I may terminate the telemedicine visit at any time. I understand that I have the right to inspect all information obtained and/or recorded in the course of the telemedicine visit and may receive copies of available information for a reasonable fee.  I understand that some of the potential risks of receiving the Services via telemedicine include:  Delay or interruption in medical evaluation due to technological equipment failure or disruption; Information transmitted may not be sufficient (e.g.  poor resolution of images) to allow for appropriate medical decision making by the Practitioner; and/or  In rare instances, security protocols could fail, causing a breach of personal health information.  Furthermore, I acknowledge that it is my responsibility to provide information about my medical history, conditions and care that is complete and accurate to the best of my ability. I acknowledge that Practitioner's advice, recommendations, and/or decision may be based on factors not within their control, such as incomplete or inaccurate data provided by me or distortions of diagnostic images or specimens that may result from electronic transmissions. I understand that the practice of medicine is not an exact science and that Practitioner makes no warranties or guarantees regarding treatment outcomes. I acknowledge that a copy of this consent can be made available to me via my patient portal [facility] MyChart), or I can request a printed copy by calling the office of [facility].    I understand that my insurance will be billed for this visit.   I have read or had this consent read to me. I understand the contents of this consent, which adequately explains the benefits and risks of the Services being provided via telemedicine.  I have been provided ample opportunity to ask questions regarding this consent and the Services and have had my questions answered to my satisfaction. I give my informed consent for the services to be provided through the use of telemedicine in my medical care

## 2023-09-21 NOTE — Telephone Encounter (Signed)
   Name: Dean Riedel Sr.  DOB: 02-20-1951  MRN: [id number]  Primary Cardiologist: Chrystie Nose, MD   Preoperative team, please contact this patient and set up a phone call appointment for further preoperative risk assessment. Please obtain consent and complete medication review. Thank you for your help.  I confirm that guidance regarding antiplatelet and oral anticoagulation therapy has been completed and, if necessary, noted below.  Per office protocol, patient can hold Eliquis for 1-2 days prior to procedure.    I also confirmed the patient resides in the state of [place]. As per [facility] [employer] telemedicine laws, the patient must reside in the state in which the provider is licensed.  Rip Harbour, NP 09/21/2023, 8:31 AM [facility]

## 2023-10-01 ENCOUNTER — Ambulatory Visit: Payer: No Typology Code available for payment source | Attending: Cardiology

## 2023-10-01 DIAGNOSIS — Z95810 Presence of automatic (implantable) cardiac defibrillator: Secondary | ICD-10-CM | POA: Diagnosis not present

## 2023-10-01 DIAGNOSIS — I5022 Chronic systolic (congestive) heart failure: Secondary | ICD-10-CM

## 2023-10-01 NOTE — Progress Notes (Signed)
 EPIC Encounter for ICM Monitoring  Patient Name: Dean Geiman Sr. is a 73 y.o. male Date: 10/01/2023 Primary Care Physican: [name], MD Primary Cardiologist: [facility] Electrophysiologist: Inocencio Nephrologist:  [facility] 01/31/2023 Weight:   183 lbs 06/21/2023 Weight: 183 lbs 07/24/2023 Weight: 183-184 lbs   Since 03-Sep-2023 Time in AF   0.0 hr/day (0.0%)(taking Eliquis )   Spoke with patient and heart failure questions reviewed.  Transmission results reviewed.  Pt reports swelling of feet and productive cough but improving after taking extra Lasix  tablet x 2 days.  PCP and was prescribed tessalon  pearls and nose spray.  He is unsure if the congestion is related to a cold or fluid retention.      Diet: Typically does not limit salt intake.  Eats restaurant foods 2-3 times a week.   Optivol Thoracic impedance suggesting chronic possible fluid accumulation starting 06/2023 but does slightly improve when extra Lasix  is taken.  Fluid index greater than normal threshold starting 10/24   Prescribed:  Furosemide  40 mg Take 1 tablet (40 mg total) by mouth daily. Per 10/23/2022 ICM note from Dr Mona he can take an extra 40 mg lasix  as needed up to 3 days if weight goes up or he develops swelling.   Spironolactone  25 mg take 1 tablet by mouth daily Eliquis  5 mg take 1 tablet twice a day   Labs: 07/05/2023 Creatinine 2.03, BUN 50, Potassium 4.3, Sodium 139, GFR 34 11/07/2022 Creatinine 1.78, BUN 42, Potassium 4.8, Sodium 142, GFR 40, BNP 2,422 A complete set of results can be found in Results Review.   Recommendations:  Symptoms improving after taking extra Lasix  tablet x 2 days and will take extra tablet for 3rd day.      Follow-up plan: ICM clinic phone appointment on 10/15/2023 to recheck fluid levels.   91 day device clinic remote transmission 11/15/2023.     EP/Cardiology Office Visits:   Recall 01/15/2024 with Dr Mona.  Recall 07/08/2024 with Dr Inocencio.   Copy of ICM check sent  to Dr. Inocencio and Dr Mona as RICK.  3 month ICM trend: 10/01/2023.    12-14 Month ICM trend:     Dean GORMAN Garner, RN 10/01/2023 2:22 PM

## 2023-10-01 NOTE — Progress Notes (Signed)
  Received: Today Hilty, Lisette Abu, MD  Mylin Gignac, Josephine Igo, RN Thanks - glad it worked

## 2023-10-16 ENCOUNTER — Ambulatory Visit: Payer: No Typology Code available for payment source | Attending: Cardiology

## 2023-10-18 NOTE — Progress Notes (Signed)
EPIC Encounter for ICM Monitoring  Patient Name: Dean Clemmer Sr. is a 73 y.o. male Date: 10/18/2023 Primary Care Physican: Ralene Ok, MD Primary Cardiologist: [facility] Electrophysiologist: Elberta Fortis Nephrologist:  [facility] 01/31/2023 Weight:   183 lbs 06/21/2023 Weight: 183 lbs 07/24/2023 Weight: 183-184 lbs 10/18/2023 Weight: 187 lbs   Clinical Status (01-Oct-2023 to 18-Oct-2023) Time in AF   0.0 hr/day (0.0%)(taking Eliquis)   Spoke with patient and heart failure questions reviewed.  Transmission results reviewed.  Pt reports swelling of feet have resolved after taking extra Lasix x 3 days   Diet:  10/18/2023 Has decreased salt intake and restaurant foods in the last several weeks.   Optivol Thoracic impedance suggesting fluid levels returned close to normal after taking Lasix 40 mg twice a day x 3 days.  Fluid index greater than normal threshold starting 07/19/23.   Prescribed:  Furosemide 40 mg Take 1 tablet (40 mg total) by mouth daily. Per 10/23/2022 ICM note from Dr Rennis Golden he can take an extra 40 mg lasix as needed up to 3 days if weight goes up or he develops swelling.   Spironolactone 25 mg take 1 tablet by mouth daily Eliquis 5 mg take 1 tablet twice a day   Labs: 07/05/2023 Creatinine 2.03, BUN 50, Potassium 4.3, Sodium 139, GFR 34 11/07/2022 Creatinine 1.78, BUN 42, Potassium 4.8, Sodium 142, GFR 40, BNP 2,422 A complete set of results can be found in Results Review.   Recommendations:   Recommendation to limit salt intake.  Encouraged to call if experiencing any fluid symptoms.       Follow-up plan: ICM clinic phone appointment on 11/05/2023.   91 day device clinic remote transmission 11/15/2023.     EP/Cardiology Office Visits:   Recall 01/15/2024 with Dr Rennis Golden.  Recall 07/08/2024 with Dr Elberta Fortis.   Copy of ICM check sent to Dr. Elberta Fortis.  3 month ICM trend: 10/18/2023.    12-14 Month ICM trend:     Karie Soda, RN 10/18/2023 12:34 PM

## 2023-10-26 ENCOUNTER — Telehealth: Payer: Self-pay

## 2023-10-26 ENCOUNTER — Ambulatory Visit: Payer: No Typology Code available for payment source | Attending: Cardiology

## 2023-10-26 DIAGNOSIS — Z01810 Encounter for preprocedural cardiovascular examination: Secondary | ICD-10-CM

## 2023-10-26 NOTE — Progress Notes (Signed)
Virtual Visit via Telephone Note   Because of Dean Furukawa Sr.'s co-morbid illnesses, he is at least at moderate risk for complications without adequate follow up.  This format is felt to be most appropriate for this patient at this time.  The patient did not have access to video technology/had technical difficulties with video requiring transitioning to audio format only (telephone).  All issues noted in this document were discussed and addressed.  No physical exam could be performed with this format.  Please refer to the patient's chart for his consent to telehealth for [facility].  Evaluation Performed:  Preoperative cardiovascular risk assessment _____________   Date:  10/26/2023   Patient ID:  Dean Jasper Sr., DOB June 01, 1951, MRN [id number] Patient Location:  Home Provider location:   Office  Primary Care Provider:  Ralene Ok, MD Primary Cardiologist:  Chrystie Nose, MD  Chief Complaint / Patient Profile   73 y.o. y/o male with a h/o chronic systolic CHF, paroxysmal atrial fibrillation, ICD, ischemic cardiomyopathy, CAD who is pending colonoscopy and presents today for telephonic preoperative cardiovascular risk assessment.  History of Present Illness    Dean Scarpelli Sr. is a 73 y.o. male who presents via audio/video conferencing for a telehealth visit today.  Pt was last seen in cardiology clinic on 07/19/2023 by Francis Dowse PA-C.  At that time Dean Oaxaca Sr. was doing well .  The patient is now pending procedure as outlined above. Since his last visit, he remains stable from a cardiac standpoint.  Today he denies chest pain, shortness of breath, lower extremity edema, fatigue, palpitations, melena, hematuria, hemoptysis, diaphoresis, weakness, presyncope, syncope, orthopnea, and PND.   Past Medical History    Past Medical History:  Diagnosis Date   AICD (automatic cardioverter/defibrillator) present 10/10/2016   a. 09/2016 s/p MDT Visia AF  MRI single lead ICD (ser # [id number]).   Arthritis    "maybe in his spine" (09/05/2016)   Asthma    pt. denies   Cardiac arrest (HCC)    a. 08/2016 VT/VF Arrest-->09/2016 s/p MDT Visia AF MRI single lead ICD (ser # [id number]).   Chronic HFrEF (heart failure with reduced ejection fraction) (HCC)    a. 08/2016 Echo: EF 35-40%; b. 04/2018 Echo: EF 30-35%; c. 04/2020 Echo: EF 30-35%; d. 11/2021 Echo: EF 40-45%, GrI DD, nl RV fxn, sev dil LA, mild-mod MR, mild-mod AI, Ao root 38mm.   Coronary artery disease    a. 08/2016 VT Arrest/Cath: LM nl, LAD nl, LCX nl, RGCA 90p, 100d w/ L->R collats, EF 35-45%.   Gout    Hyperlipidemia LDL goal <70    Hypertension    Ischemic cardiomyopathy    a. 08/2016 Echo: EF 35-40%; b. 04/2018 Echo: EF 30-35%; c. 04/2020 Echo: EF 30-35%; d. 11/2021 Echo: EF 40-45%.   LBBB (left bundle branch block)    OSA on CPAP    does not wear CPAP   PAF (paroxysmal atrial fibrillation) (HCC)    a. CHA2DS2VASc = 5-->Eliquis.   Type II diabetes mellitus (HCC)    Past Surgical History:  Procedure Laterality Date   CARDIAC CATHETERIZATION N/A 09/07/2016   Procedure: Left Heart Cath and Coronary Angiography;  Surgeon: Peter M Swaziland, MD;  Location: [facility] INVASIVE CV LAB;  Service: Cardiovascular;  Laterality: N/A;   COLONOSCOPY WITH PROPOFOL N/A 08/30/2018   Procedure: COLONOSCOPY WITH PROPOFOL;  Surgeon: Jeani Hawking, MD;  Location: [facility] ENDOSCOPY;  Service: Endoscopy;  Laterality: N/A;   EP IMPLANTABLE DEVICE N/A 10/10/2016  Procedure: ICD Implant;  Surgeon: Will Jorja Loa, MD;  Location: [facility] INVASIVE CV LAB;  Service: Cardiovascular;  Laterality: N/A;   LUMBAR LAMINECTOMY/DECOMPRESSION MICRODISCECTOMY N/A 01/18/2018   Procedure: L2-3, L3-4, L-4-5 CENTRAL DECOMPRESSION;  Surgeon: Eldred Manges, MD;  Location: [facility] OR;  Service: Orthopedics;  Laterality: N/A;   LUMBAR LAMINECTOMY/DECOMPRESSION MICRODISCECTOMY N/A 08/12/2020   Procedure: THORACIC ELEVEN LAMINECTOMY;  Surgeon: Julio Sicks, MD;  Location: [facility] OR;  Service: Neurosurgery;  Laterality: N/A;   POLYPECTOMY  08/30/2018   Procedure: POLYPECTOMY;  Surgeon: Jeani Hawking, MD;  Location: [facility] ENDOSCOPY;  Service: Endoscopy;;    Allergies  Allergies  Allergen Reactions   Contrast Media [Iodinated Contrast Media] Shortness Of Breath and Nausea And Vomiting   Lisinopril Cough    Home Medications    Prior to Admission medications   Medication Sig Start Date End Date Taking? Authorizing Provider  allopurinol (ZYLOPRIM) 300 MG tablet Take 900 mg by mouth daily. 06/13/23   [provider]  amiodarone (PACERONE) 200 MG tablet Take 1 tablet (200 mg total) by mouth daily. 09/06/22   Sheilah Pigeon, PA-C  atorvastatin (LIPITOR) 40 MG tablet Take 1 tablet (40 mg total) by mouth daily at 6 PM. Patient taking differently: Take 40 mg by mouth daily. 10/27/16   Camnitz, Andree Coss, MD  benzonatate (TESSALON) 200 MG capsule Take 200 mg by mouth 3 (three) times daily as needed. 01/24/23   [provider]  carvedilol (COREG) 12.5 MG tablet TAKE 1 TABLET (12.5MG  TOTAL) BY MOUTH TWICE A DAY WITH MEALS 08/28/23   Sheilah P[facility]on, PA-C  cholecalciferol (VITAMIN D3) 25 MCG (1000 UNIT) tablet Take 1,000 Units by mouth daily. 06/13/23   [provider]  ELIQUIS 5 MG TABS tablet TAKE 1 TABLET BY MOUTH TWICE A DAY 04/20/23   Camnitz, Andree Coss, MD  ezetimibe (ZETIA) 10 MG tablet Take 1 tablet (10 mg total) by mouth daily. 05/07/23   Sheilah P[facility]on, PA-C  furosemide (LASIX) 40 MG tablet Take 1 tablet (40 mg total) by mouth daily. 09/08/22   Sheilah Pigeon, PA-C  magnesium oxide (MAG-OX) 400 (240 Mg) MG tablet TAKE 1 TABLET BY MOUTH EVERY DAY 09/06/22   Hilty, Lisette Abu, MD  sacubitril-valsartan (ENTRESTO) 24-26 MG Take 0.5 tablets by mouth 2 (two) times daily.    [provider]  spironolactone (ALDACTONE) 25 MG tablet Take 0.5 tablets (12.5 mg total) by mouth daily. 05/08/23   Sheilah Pigeon, PA-C     Physical Exam    Vital Signs:  Dean Jasper Sr. does not have vital signs available for review today.  Given telephonic nature of communication, physical exam is limited. AAOx3. NAD. Normal affect.  Speech and respirations are unlabored.  Accessory Clinical Findings    None  Assessment & Plan    1.  Preoperative Cardiovascular Risk Assessment: Colonoscopy, 11/16/2023,Surgeon:  DR. HUNG Surgeon's Group or Practice Name:  [facility] Fax number:  [phone]      Primary Cardiologist: Chrystie Nose, MD  Chart reviewed as part of pre-operative protocol coverage. Given past medical history and time since last visit, based on ACC/AHA guidelines, Chord Takahashi Sr. would be at acceptable risk for the planned procedure without further cardiovascular testing.   CHA2DS2-VASc Score = 4   This indicates a 4.8% annual risk of stroke. The patient's score is based upon: CHF History: 1 HTN History: 1 Diabetes History: 0 Stroke History: 0 Vascular Disease History: 1 Age Score: 1 Gender Score:  0       CrCl 40 mL/min Platelet count 151 (06/14/2023)       Per office protocol, patient can hold Eliquis for 1-2 days prior to procedure.  Patient was advised that if he develops new symptoms prior to surgery to contact our office to arrange a follow-up appointment.  He verbalized understanding.  I will route this recommendation to the requesting party via Epic fax function and remove from pre-op pool.       Time:   Today, I have spent 11 minutes with the patient with telehealth technology discussing medical history, symptoms, and management plan.     Ronney Asters, NP  10/26/2023, 7:08 AM

## 2023-10-26 NOTE — Telephone Encounter (Signed)
Returned call to patient as requested by voice mail message.  He reports he has swelling of feet during the day and returns to normal after sleeping.  He will self adjust Lasix and take one extra tablet x 1 day only and then return to 40 mg daily.  He reports Kidney physician is monitoring kidneys and previously decreased Lasix to 40 mg daily.  Advised to call his kidney specialist if he feels the long term Lasix dosage needs to be adjusted. He agreed with plan.

## 2023-11-08 ENCOUNTER — Telehealth: Payer: Self-pay | Admitting: Internal Medicine

## 2023-11-08 ENCOUNTER — Other Ambulatory Visit: Payer: Self-pay | Admitting: Cardiology

## 2023-11-08 ENCOUNTER — Other Ambulatory Visit: Payer: Self-pay | Admitting: Internal Medicine

## 2023-11-08 NOTE — Telephone Encounter (Signed)
Pt c/o medication issue:  1. Name of Medication: sacubitril-valsartan (ENTRESTO) 24-26 MG   2. How are you currently taking this medication (dosage and times per day)? 0.5 tablet, 2 times daily   3. Are you having a reaction (difficulty breathing--STAT)? Cough  4. What is your medication issue? Pt is having a cough x 2-3 months with no improvement and is not sure is related to med. Requesting cb to discuss

## 2023-11-08 NOTE — Telephone Encounter (Signed)
error 

## 2023-11-09 ENCOUNTER — Encounter (HOSPITAL_COMMUNITY): Payer: Self-pay | Admitting: Gastroenterology

## 2023-11-09 MED ORDER — VALSARTAN 160 MG PO TABS: 160.0000 mg | ORAL_TABLET | Freq: Every day | ORAL | 2 refills | Status: DC

## 2023-11-09 NOTE — Telephone Encounter (Signed)
Patient identification verified by 2 forms. Marilynn Rail, RN    Called and spoke to patient  Relayed pharmacy message below  Reviewed Rx instruction/education  Advised patient to D/C [facility]  Patient aware rx sent to preferred pharmacy  Patient verbalized understanding, no questions at this time

## 2023-11-09 NOTE — Progress Notes (Signed)
Attempted to obtain medical history for pre op call via telephone, unable to reach at this time. HIPAA compliant voicemail message left requesting return call to pre surgical testing department.

## 2023-11-09 NOTE — Telephone Encounter (Signed)
Entresto can cause cough.  Have him discontinue and start valsartan 160 mg daily.  If it was the [facility], the cough should ease off within 1-2 weeks of stopping.

## 2023-11-12 NOTE — Progress Notes (Signed)
 No ICM remote transmission received for 11/05/2023 and next ICM transmission scheduled for 11/19/2023.

## 2023-11-15 ENCOUNTER — Ambulatory Visit (INDEPENDENT_AMBULATORY_CARE_PROVIDER_SITE_OTHER): Payer: Self-pay

## 2023-11-15 DIAGNOSIS — I255 Ischemic cardiomyopathy: Secondary | ICD-10-CM

## 2023-11-16 ENCOUNTER — Ambulatory Visit (HOSPITAL_BASED_OUTPATIENT_CLINIC_OR_DEPARTMENT_OTHER): Payer: No Typology Code available for payment source | Admitting: Anesthesiology

## 2023-11-16 ENCOUNTER — Ambulatory Visit (HOSPITAL_COMMUNITY): Payer: No Typology Code available for payment source | Admitting: Anesthesiology

## 2023-11-16 ENCOUNTER — Encounter (HOSPITAL_COMMUNITY)
Admission: RE | Disposition: A | Discharge status: Home / Self Care | Payer: Self-pay | Source: Home / Self Care | Attending: Gastroenterology

## 2023-11-16 ENCOUNTER — Other Ambulatory Visit: Payer: Self-pay

## 2023-11-16 ENCOUNTER — Encounter (HOSPITAL_COMMUNITY): Payer: Self-pay | Admitting: Gastroenterology

## 2023-11-16 ENCOUNTER — Ambulatory Visit (HOSPITAL_COMMUNITY)
Admission: RE | Admit: 2023-11-16 | Discharge: 2023-11-16 | Disposition: A | Discharge status: Home / Self Care | Payer: No Typology Code available for payment source | Attending: Gastroenterology | Admitting: Gastroenterology

## 2023-11-16 DIAGNOSIS — D123 Benign neoplasm of transverse colon: Secondary | ICD-10-CM | POA: Diagnosis not present

## 2023-11-16 DIAGNOSIS — I5022 Chronic systolic (congestive) heart failure: Secondary | ICD-10-CM | POA: Insufficient documentation

## 2023-11-16 DIAGNOSIS — Z1211 Encounter for screening for malignant neoplasm of colon: Secondary | ICD-10-CM

## 2023-11-16 DIAGNOSIS — K5730 Diverticulosis of large intestine without perforation or abscess without bleeding: Secondary | ICD-10-CM

## 2023-11-16 DIAGNOSIS — I4891 Unspecified atrial fibrillation: Secondary | ICD-10-CM | POA: Diagnosis not present

## 2023-11-16 DIAGNOSIS — D122 Benign neoplasm of ascending colon: Secondary | ICD-10-CM

## 2023-11-16 DIAGNOSIS — I2510 Atherosclerotic heart disease of native coronary artery without angina pectoris: Secondary | ICD-10-CM | POA: Insufficient documentation

## 2023-11-16 DIAGNOSIS — Z8249 Family history of ischemic heart disease and other diseases of the circulatory system: Secondary | ICD-10-CM | POA: Insufficient documentation

## 2023-11-16 DIAGNOSIS — G4730 Sleep apnea, unspecified: Secondary | ICD-10-CM | POA: Insufficient documentation

## 2023-11-16 DIAGNOSIS — J45909 Unspecified asthma, uncomplicated: Secondary | ICD-10-CM | POA: Insufficient documentation

## 2023-11-16 DIAGNOSIS — Z7901 Long term (current) use of anticoagulants: Secondary | ICD-10-CM | POA: Diagnosis not present

## 2023-11-16 DIAGNOSIS — I110 Hypertensive heart disease with heart failure: Secondary | ICD-10-CM | POA: Diagnosis not present

## 2023-11-16 HISTORY — PX: COLONOSCOPY WITH PROPOFOL: SHX5780

## 2023-11-16 HISTORY — PX: POLYPECTOMY: SHX5525

## 2023-11-16 HISTORY — PX: HEMOSTASIS CLIP PLACEMENT: SHX6857

## 2023-11-16 SURGERY — COLONOSCOPY WITH PROPOFOL
Anesthesia: Monitor Anesthesia Care

## 2023-11-16 MED ORDER — PHENYLEPHRINE HCL (PRESSORS) 10 MG/ML IV SOLN
INTRAVENOUS | Status: DC | PRN
  Administered 2023-11-16 (×2): 160 ug via INTRAVENOUS

## 2023-11-16 MED ORDER — LACTATED RINGERS IV SOLN: INTRAVENOUS | Status: DC | PRN

## 2023-11-16 MED ORDER — LIDOCAINE 2% (20 MG/ML) 5 ML SYRINGE
INTRAMUSCULAR | Status: DC | PRN
  Administered 2023-11-16: 100 mg via INTRAVENOUS

## 2023-11-16 MED ORDER — PROPOFOL 10 MG/ML IV BOLUS
INTRAVENOUS | Status: DC | PRN
  Administered 2023-11-16: 40 mg via INTRAVENOUS

## 2023-11-16 MED ORDER — PROPOFOL 500 MG/50ML IV EMUL
INTRAVENOUS | Status: DC | PRN
  Administered 2023-11-16: 100 ug/kg/min via INTRAVENOUS

## 2023-11-16 SURGICAL SUPPLY — 20 items
ELECT REM PT RETURN 9FT ADLT (ELECTROSURGICAL)
ELECTRODE REM PT RTRN 9FT ADLT (ELECTROSURGICAL) IMPLANT
FLOOR PAD 36X40 (MISCELLANEOUS) ×2
FORCEPS BIOP RAD 4 LRG CAP 4 (CUTTING FORCEPS) IMPLANT
FORCEPS BIOP RJ4 240 W/NDL (CUTTING FORCEPS)
FORCEPS BXJMBJMB 240X2.8X (CUTTING FORCEPS) IMPLANT
INJECTOR/SNARE I SNARE (MISCELLANEOUS) IMPLANT
LUBRICANT JELLY 4.5OZ STERILE (MISCELLANEOUS) IMPLANT
MANIFOLD NEPTUNE II (INSTRUMENTS) IMPLANT
NDL SCLEROTHERAPY 25GX240 (NEEDLE) IMPLANT
NEEDLE SCLEROTHERAPY 25GX240 (NEEDLE) IMPLANT
PAD FLOOR 36X40 (MISCELLANEOUS) ×3 IMPLANT
PROBE APC STR FIRE (PROBE) IMPLANT
PROBE INJECTION GOLD 7FR (MISCELLANEOUS) IMPLANT
SNARE ROTATE MED OVAL 20MM (MISCELLANEOUS) IMPLANT
SYR 50ML LL SCALE MARK (SYRINGE) IMPLANT
TRAP SPECIMEN MUCOUS 40CC (MISCELLANEOUS) IMPLANT
TUBING ENDO SMARTCAP PENTAX (MISCELLANEOUS) IMPLANT
TUBING IRRIGATION ENDOGATOR (MISCELLANEOUS) ×3 IMPLANT
WATER STERILE IRR 1000ML POUR (IV SOLUTION) IMPLANT

## 2023-11-16 NOTE — Discharge Instructions (Signed)

## 2023-11-16 NOTE — Anesthesia Preprocedure Evaluation (Addendum)
Anesthesia Evaluation  Patient identified by MRN, date of birth, ID band Patient awake    Reviewed: Allergy & Precautions, NPO status , Patient's Chart, lab work & pertinent test results  Airway Mallampati: I  TM Distance: >3 FB Neck ROM: Full    Dental  (+) Dental Advisory Given, Poor Dentition, Missing   Pulmonary asthma , sleep apnea and Continuous Positive Airway Pressure Ventilation    breath sounds clear to auscultation       Cardiovascular hypertension, + CAD and +CHF  + dysrhythmias Atrial Fibrillation + Cardiac Defibrillator  Rhythm:Regular Rate:Normal  Echo:  1. Akinesis of the inferolateral wall with overall moderate LV  dysfunction.   2. Left ventricular ejection fraction, by estimation, is 35 to 40%. The  left ventricle has moderately decreased function. The left ventricle  demonstrates regional wall motion abnormalities (see scoring  diagram/findings for description). The left  ventricular internal cavity size was moderately dilated. Left ventricular  diastolic parameters are consistent with Grade I diastolic dysfunction  (impaired relaxation). Elevated left atrial pressure.   3. Right ventricular systolic function is normal. The right ventricular  size is normal. There is mildly elevated pulmonary artery systolic  pressure.   4. Left atrial size was moderately dilated.   5. The mitral valve is normal in structure. Moderate mitral valve  regurgitation. No evidence of mitral stenosis.   6. The aortic valve has an indeterminant number of cusps. Aortic valve  regurgitation is moderate. No aortic stenosis is present.   7. Aortic dilatation noted. There is borderline dilatation of the aortic  root, measuring 39 mm.   8. The inferior vena cava is normal in size with greater than 50%  respiratory variability, suggesting right atrial pressure of 3 mmHg.     Neuro/Psych negative neurological ROS  negative psych ROS    GI/Hepatic negative GI ROS, Neg liver ROS,,,  Endo/Other  diabetes    Renal/GU Renal disease     Musculoskeletal  (+) Arthritis ,    Abdominal   Peds  Hematology  (+) Blood dyscrasia, anemia   Anesthesia Other Findings   Reproductive/Obstetrics                             Anesthesia Physical Anesthesia Plan  ASA: 3  Anesthesia Plan: MAC   Post-op Pain Management: Minimal or no pain anticipated   Induction: Intravenous  PONV Risk Score and Plan: 0 and Propofol infusion  Airway Management Planned: Natural Airway and Nasal Cannula  Additional Equipment: None  Intra-op Plan:   Post-operative Plan:   Informed Consent: I have reviewed the patients History and Physical, chart, labs and discussed the procedure including the risks, benefits and alternatives for the proposed anesthesia with the patient or authorized representative who has indicated his/her understanding and acceptance.       Plan Discussed with: CRNA  Anesthesia Plan Comments:        Anesthesia Quick Evaluation

## 2023-11-16 NOTE — H&P (Signed)
Dean Jasper Sr. HPI: At this time the patient denies any problems with nausea, vomiting, fevers, chills, abdominal pain, diarrhea, constipation, hematochezia, melena, GERD, or dysphagia. The patient denies any known family history of colon cancers. No complaints of chest pain, SOB, MI, or sleep apnea.  His colonoscopy on 08/30/2018 was normal, but his 2016 colonoscopy was positive for 5 adenomas.  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 09/2016 s/p MDT Visia AF MRI single lead ICD (ser # [id number]).   Arthritis    "maybe in his spine" (09/05/2016)   Asthma    pt. denies   Cardiac arrest (HCC)    a. 08/2016 VT/VF Arrest-->09/2016 s/p MDT Vis[employer] AF MRI single lead ICD (ser # [id number]).   Chronic HFrEF (heart failure with reduced ejection fraction) (HCC)    a. 08/2016 Echo: EF 35-40%; b. 04/2018 Echo: EF 30-35%; c. 04/2020 Echo: EF 30-35%; d. 11/2021 Echo: EF 40-45%, GrI DD, nl RV fxn, sev dil LA, mild-mod MR, mild-mod AI, Ao root 38mm.   Coronary artery disease    a. 08/2016 VT Arrest/Cath: LM nl, LAD nl, LCX nl, RGCA 90p, 100d w/ L->R collats, EF 35-45%.   Gout    Hyperlipidemia LDL goal <70    Hypertension    Ischemic cardiomyopathy    a. 08/2016 Echo: EF 35-40%; b. 04/2018 Echo: EF 30-35%; c. 04/2020 Echo: EF 30-35%; d. 11/2021 Echo: EF 40-45%.   LBBB (left bundle branch block)    OSA on CPAP    does not wear CPAP   PAF (paroxysmal atrial fibrillation) (HCC)    a. CHA2DS2VASc = 5-->Eliquis.   Type II diabetes mellitus (HCC)     Past Surgical History:  Procedure Laterality Date   CARDIAC CATHETERIZATION N/A 09/07/2016   Procedure: Left Heart Cath and Coronary Angiography;  Surgeon: Peter M Swaziland, MD;  Location: [facility] INVASIVE CV LAB;  Service: Cardiovascular;  Laterality: N/A;   COLONOSCOPY WITH PROPOFOL N/A 08/30/2018   Procedure: COLONOSCOPY WITH  PROPOFOL;  Surgeon: Jeani Hawking, MD;  Location: [facility] ENDOSCOPY;  Service: Endoscopy;  Laterality: N/A;   EP IMPLANTABLE DEVICE N/A 10/10/2016   Procedure: ICD Implant;  Surgeon: Will Jorja Loa, MD;  Location: [facility] INVASIVE CV LAB;  Service: Cardiovascular;  Laterality: N/A;   LUMBAR LAMINECTOMY/DECOMPRESSION MICRODISCECTOMY N/A 01/18/2018   Procedure: L2-3, L3-4, L-4-5 CENTRAL DECOMPRESSION;  Surgeon: Eldred Manges, MD;  Location: [facility] OR;  Service: Orthopedics;  Laterality: N/A;   LUMBAR LAMINECTOMY/DECOMPRESSION MICRODISCECTOMY N/A 08/12/2020   Procedure: THORACIC ELEVEN LAMINECTOMY;  Surgeon: Julio Sicks, MD;  Location: [facility] OR;  Service: Neurosurgery;  Laterality: N/A;   POLYPECTOMY  08/30/2018   Procedure: POLYPECTOMY;  Surgeon: Jeani Hawking, MD;  Location: [facility] ENDOSCOPY;  Service: Endoscopy;;    Family History  Problem Relation Age of Onset   Heart attack Father        died in his 32's   Hypertension Sister    Cancer Brother        uncertain, "leg"   Hypertension Sister     Social History:  reports that he has never smoked. He has never used smokeless tobacco. He reports that he does not drink alcohol and does not use drugs.  Allergies:  Allergies  Allergen Reactions   Contrast Media [Iodinated Contrast Media] Shortness Of Breath and Nausea And Vomiting   Lisinopril Cough    Medications: Scheduled: Continuous:  No results found for this or any previous visit (from the past 24 hours).   No results found.  ROS:  As stated above in the HPI otherwise negative.  There were no vitals taken for this visit.    PE: Gen: NAD, Alert and Oriented HEENT:  [place]/AT, EOMI Neck: Supple, no LAD Lungs: CTA Bilaterally CV: RRR without M/G/R ABD: Soft, NTND, +BS Ext: No C/C/E  Assessment/Plan: 1) Personal history of polyps - colonoscopy.  Trelyn Vanderlinde D 11/16/2023, 7:24 AM

## 2023-11-16 NOTE — Transfer of Care (Signed)
Immediate Anesthesia Transfer of Care Note  Patient: Dean Tesler Sr.  Procedure(s) Performed: COLONOSCOPY WITH PROPOFOL POLYPECTOMY  Patient Location: Endoscopy Unit  Anesthesia Type:MAC  Level of Consciousness: awake, alert , and oriented  Airway & Oxygen Therapy: Patient Spontanous Breathing and Patient connected to nasal cannula oxygen  Post-op Assessment: Report given to RN and Post -op Vital signs reviewed and stable  Post vital signs: Reviewed and stable  Last Vitals:  Vitals Value Taken Time  BP 113/26 11/16/23 0902  Temp    Pulse 67 11/16/23 0903  Resp 30 11/16/23 0903  SpO2 100 % 11/16/23 0903  Vitals shown include unfiled device data.  Last Pain:  Vitals:   11/16/23 0809  TempSrc: Temporal  PainSc: 0-No pain         Complications: No notable events documented.

## 2023-11-16 NOTE — Op Note (Signed)
[facility] Patient Name: Dean [name] Procedure Date: 11/16/2023 MRN: [id number] Attending MD: Jeani Hawking , MD, [id number] Date of Birth: 1951-05-21 CSN: [id number] Age: 73 Admit Type: Outpatient Procedure:                Colonoscopy Indications:              High risk colon cancer surveillance: Personal                            history of colonic polyps Providers:                Jeani Hawking, MD, Jacquelyn "Jaci" Clelia Croft, RN,                            Rozetta Nunnery, Technician Referring MD:              Medicines:                Propofol per Anesthesia Complications:            No immediate complications. Estimated Blood Loss:     Estimated blood loss: none. Procedure:                Pre-Anesthesia Assessment:                           - Prior to the procedure, a History and Physical                            was performed, and patient medications and                            allergies were reviewed. The patient's tolerance of                            previous anesthesia was also reviewed. The risks                            and benefits of the procedure and the sedation                            options and risks were discussed with the patient.                            All questions were answered, and informed consent                            was obtained. Prior Anticoagulants: The patient has                            taken Eliquis (apixaban), last dose was 2 days                            prior to procedure. ASA Grade Assessment: III - A  patient with severe systemic disease. After                            reviewing the risks and benefits, the patient was                            deemed in satisfactory condition to undergo the                            procedure.                           - Sedation was administered by an anesthesia                            professional. Deep sedation was attained.                            After obtaining informed consent, the colonoscope                            was passed under direct vision. Throughout the                            procedure, the patient's blood pressure, pulse, and                            oxygen saturations were monitored continuously. The                            CF-HQ190L ([id number]) Olympus colonoscope was                            introduced through the anus and advanced to the the                            cecum, identified by appendiceal orifice and                            ileocecal valve. The colonoscopy was performed                            without difficulty. The patient tolerated the                            procedure well. The quality of the bowel                            preparation was evaluated using the BBPS [facility]                            Bowel Preparation Scale) with scores of: Right                            Colon =  3 (entire mucosa seen well with no residual                            staining, small fragments of stool or opaque                            liquid), Transverse Colon = 3 (entire mucosa seen                            well with no residual staining, small fragments of                            stool or opaque liquid) and Left Colon = 2 (minor                            amount of residual staining, small fragments of                            stool and/or opaque liquid, but mucosa seen well).                            The total BBPS score equals 8. The quality of the                            bowel preparation was good. The ileocecal valve,                            appendiceal orifice, and rectum were photographed. Scope In: 8:32:29 AM Scope Out: 8:54:45 AM Scope Withdrawal Time: 0 hours 19 minutes 48 seconds  Total Procedure Duration: 0 hours 22 minutes 16 seconds  Findings:      A 2 mm polyp was found in the ascending colon. The polyp was sessile.       The polyp was removed  with a cold snare. Resection and retrieval were       complete.      A 10 mm polyp was found in the transverse colon. The polyp was       semi-pedunculated. The polyp was removed with a hot snare. Resection was       complete, but the polyp tissue was not retrieved. To prevent bleeding       post-intervention, two hemostatic clips were successfully placed (MR       safe). Clip manufacturer: [employer]. There was no bleeding at       the end of the procedure.      Scattered large-mouthed, medium-mouthed and small-mouthed diverticula       were found in the sigmoid colon.      The transverse colon polyp was not able to be retrieved. Impression:               - One 2 mm polyp in the ascending colon, removed                            with a cold snare. Resected and retrieved.                           -  One 10 mm polyp in the transverse colon, removed                            with a hot snare. Resected and retrieved. Clip                            manufacturer: [employer]. Clips (MR safe)                            were placed.                           - Diverticulosis in the sigmoid colon. Moderate Sedation:      Not Applicable - Patient had care per Anesthesia. Recommendation:           - Patient has a contact number available for                            emergencies. The signs and symptoms of potential                            delayed complications were discussed with the                            patient. Return to normal activities tomorrow.                            Written discharge instructions were provided to the                            patient.                           - Resume previous diet.                           - Continue present medications.                           - Await pathology results.                           - Repeat colonoscopy is not recommended for                            surveillance. With the patient's significant                             cardiac history, the risks will be higher than the                            benefits.                           - Resume Eliquis. Procedure Code(s):        --- Professional ---  16109, Colonoscopy, flexible; with removal of                            tumor(s), polyp(s), or other lesion(s) by snare                            technique Diagnosis Code(s):        --- Professional ---                           Z86.010, Personal history of colonic polyps                           D12.2, Benign neoplasm of ascending colon                           D12.3, Benign neoplasm of transverse colon (hepatic                            flexure or splenic flexure)                           K57.30, Diverticulosis of large intestine without                            perforation or abscess without bleeding CPT copyright 2022 [employer]. All rights reserved. The codes documented in this report are preliminary and upon coder review may  be revised to meet current compliance requirements. Jeani Hawking, MD Jeani Hawking, MD 11/16/2023 9:01:18 AM This report has been signed electronically. Number of Addenda: 0

## 2023-11-16 NOTE — Anesthesia Postprocedure Evaluation (Signed)
Anesthesia Post Note  Patient: Labrian Torregrossa Sr.  Procedure(s) Performed: COLONOSCOPY WITH PROPOFOL POLYPECTOMY HEMOSTASIS CLIP PLACEMENT     Patient location during evaluation: PACU Anesthesia Type: MAC Level of consciousness: awake and alert Pain management: pain level controlled Vital Signs Assessment: post-procedure vital signs reviewed and stable Respiratory status: spontaneous breathing, nonlabored ventilation, respiratory function stable and patient connected to nasal cannula oxygen Cardiovascular status: stable and blood pressure returned to baseline Postop Assessment: no apparent nausea or vomiting Anesthetic complications: no  There were no known notable events for this encounter.  Last Vitals:  Vitals:   11/16/23 0930 11/16/23 0933  BP: (!) 127/42 (!) 127/58  Pulse: 69 69  Resp: (!) 25 (!) 22  Temp:    SpO2: 99% 100%    Last Pain:  Vitals:   11/16/23 0933  TempSrc:   PainSc: 0-No pain                 Shelton Silvas

## 2023-11-17 ENCOUNTER — Encounter (HOSPITAL_COMMUNITY): Payer: Self-pay

## 2023-11-17 ENCOUNTER — Telehealth: Payer: Self-pay | Admitting: Nurse Practitioner

## 2023-11-17 ENCOUNTER — Observation Stay (HOSPITAL_COMMUNITY)
Admission: EM | Admit: 2023-11-17 | Discharge: 2023-11-20 | Disposition: A | Discharge status: Home / Self Care | Payer: No Typology Code available for payment source | Attending: Family Medicine | Admitting: Family Medicine

## 2023-11-17 ENCOUNTER — Other Ambulatory Visit: Payer: Self-pay

## 2023-11-17 DIAGNOSIS — K5730 Diverticulosis of large intestine without perforation or abscess without bleeding: Secondary | ICD-10-CM | POA: Insufficient documentation

## 2023-11-17 DIAGNOSIS — D649 Anemia, unspecified: Secondary | ICD-10-CM | POA: Diagnosis not present

## 2023-11-17 DIAGNOSIS — I482 Chronic atrial fibrillation, unspecified: Secondary | ICD-10-CM | POA: Diagnosis present

## 2023-11-17 DIAGNOSIS — K640 First degree hemorrhoids: Secondary | ICD-10-CM | POA: Diagnosis not present

## 2023-11-17 DIAGNOSIS — J45909 Unspecified asthma, uncomplicated: Secondary | ICD-10-CM | POA: Insufficient documentation

## 2023-11-17 DIAGNOSIS — Z7901 Long term (current) use of anticoagulants: Secondary | ICD-10-CM | POA: Diagnosis not present

## 2023-11-17 DIAGNOSIS — I5042 Chronic combined systolic (congestive) and diastolic (congestive) heart failure: Secondary | ICD-10-CM | POA: Diagnosis present

## 2023-11-17 DIAGNOSIS — I480 Paroxysmal atrial fibrillation: Secondary | ICD-10-CM | POA: Diagnosis not present

## 2023-11-17 DIAGNOSIS — D122 Benign neoplasm of ascending colon: Secondary | ICD-10-CM | POA: Insufficient documentation

## 2023-11-17 DIAGNOSIS — K625 Hemorrhage of anus and rectum: Secondary | ICD-10-CM | POA: Diagnosis present

## 2023-11-17 DIAGNOSIS — Z79899 Other long term (current) drug therapy: Secondary | ICD-10-CM | POA: Insufficient documentation

## 2023-11-17 DIAGNOSIS — D123 Benign neoplasm of transverse colon: Secondary | ICD-10-CM | POA: Insufficient documentation

## 2023-11-17 DIAGNOSIS — N183 Chronic kidney disease, stage 3 unspecified: Secondary | ICD-10-CM | POA: Diagnosis present

## 2023-11-17 DIAGNOSIS — E1122 Type 2 diabetes mellitus with diabetic chronic kidney disease: Secondary | ICD-10-CM | POA: Diagnosis not present

## 2023-11-17 DIAGNOSIS — K922 Gastrointestinal hemorrhage, unspecified: Secondary | ICD-10-CM | POA: Diagnosis not present

## 2023-11-17 DIAGNOSIS — E669 Obesity, unspecified: Secondary | ICD-10-CM | POA: Diagnosis present

## 2023-11-17 DIAGNOSIS — I130 Hypertensive heart and chronic kidney disease with heart failure and stage 1 through stage 4 chronic kidney disease, or unspecified chronic kidney disease: Secondary | ICD-10-CM | POA: Diagnosis not present

## 2023-11-17 DIAGNOSIS — N1832 Chronic kidney disease, stage 3b: Secondary | ICD-10-CM | POA: Insufficient documentation

## 2023-11-17 DIAGNOSIS — E1169 Type 2 diabetes mellitus with other specified complication: Secondary | ICD-10-CM | POA: Diagnosis present

## 2023-11-17 DIAGNOSIS — Z95810 Presence of automatic (implantable) cardiac defibrillator: Secondary | ICD-10-CM | POA: Diagnosis present

## 2023-11-17 LAB — COMPREHENSIVE METABOLIC PANEL
ALT: 14 U/L (ref 0–44)
AST: 18 U/L (ref 15–41)
Albumin: 3 g/dL — ABNORMAL LOW (ref 3.5–5.0)
Alkaline Phosphatase: 54 U/L (ref 38–126)
Anion gap: 10 (ref 5–15)
BUN: 30 mg/dL — ABNORMAL HIGH (ref 8–23)
CO2: 20 mmol/L — ABNORMAL LOW (ref 22–32)
Calcium: 8.8 mg/dL — ABNORMAL LOW (ref 8.9–10.3)
Chloride: 105 mmol/L (ref 98–111)
Creatinine, Ser: 1.7 mg/dL — ABNORMAL HIGH (ref 0.61–1.24)
GFR, Estimated: 42 mL/min — ABNORMAL LOW (ref >60)
Glucose, Bld: 88 mg/dL (ref 70–99)
Potassium: 3.1 mmol/L — ABNORMAL LOW (ref 3.5–5.1)
Sodium: 135 mmol/L (ref 135–145)
Total Bilirubin: 0.4 mg/dL (ref 0.0–1.2)
Total Protein: 5.8 g/dL — ABNORMAL LOW (ref 6.5–8.1)

## 2023-11-17 LAB — IRON AND TIBC
Iron: 33 ug/dL — ABNORMAL LOW (ref 45–182)
Saturation Ratios: 16 % — ABNORMAL LOW (ref 17.9–39.5)
TIBC: 202 ug/dL — ABNORMAL LOW (ref 250–450)
UIBC: 169 ug/dL

## 2023-11-17 LAB — PROTIME-INR
INR: 1.8 — ABNORMAL HIGH (ref 0.8–1.2)
Prothrombin Time: 20.7 s — ABNORMAL HIGH (ref 11.4–15.2)

## 2023-11-17 LAB — CBC WITH DIFFERENTIAL/PLATELET
Abs Immature Granulocytes: 0.07 10*3/uL (ref 0.00–0.07)
Basophils Absolute: 0 10*3/uL (ref 0.0–0.1)
Basophils Relative: 0 %
Eosinophils Absolute: 1 10*3/uL — ABNORMAL HIGH (ref 0.0–0.5)
Eosinophils Relative: 10 %
HCT: 22.8 % — ABNORMAL LOW (ref 39.0–52.0)
Hemoglobin: 6.7 g/dL — CL (ref 13.0–17.0)
Immature Granulocytes: 1 %
Lymphocytes Relative: 14 %
Lymphs Abs: 1.4 10*3/uL (ref 0.7–4.0)
MCH: 28.2 pg (ref 26.0–34.0)
MCHC: 29.4 g/dL — ABNORMAL LOW (ref 30.0–36.0)
MCV: 95.8 fL (ref 80.0–100.0)
Monocytes Absolute: 0.7 10*3/uL (ref 0.1–1.0)
Monocytes Relative: 7 %
Neutro Abs: 6.9 10*3/uL (ref 1.7–7.7)
Neutrophils Relative %: 68 %
Platelets: 163 10*3/uL (ref 150–400)
RBC: 2.38 MIL/uL — ABNORMAL LOW (ref 4.22–5.81)
RDW: 15.9 % — ABNORMAL HIGH (ref 11.5–15.5)
WBC: 10 10*3/uL (ref 4.0–10.5)
nRBC: 0 % (ref 0.0–0.2)

## 2023-11-17 LAB — HEMOGLOBIN A1C
Hgb A1c MFr Bld: 5.1 % (ref 4.8–5.6)
Mean Plasma Glucose: 99.67 mg/dL

## 2023-11-17 LAB — APTT: aPTT: 41 s — ABNORMAL HIGH (ref 24–36)

## 2023-11-17 LAB — FERRITIN: Ferritin: 87 ng/mL (ref 24–336)

## 2023-11-17 LAB — PREPARE RBC (CROSSMATCH)

## 2023-11-17 MED ORDER — ATORVASTATIN CALCIUM 40 MG PO TABS
40.0000 mg | ORAL_TABLET | Freq: Every day | ORAL | Status: DC
  Administered 2023-11-18 – 2023-11-20 (×3): 40 mg via ORAL
  Filled 2023-11-17 (×3): qty 1

## 2023-11-17 MED ORDER — ACETAMINOPHEN 650 MG RE SUPP: 650.0000 mg | Freq: Four times a day (QID) | RECTAL | Status: DC | PRN

## 2023-11-17 MED ORDER — NA SULFATE-K SULFATE-MG SULF 17.5-3.13-1.6 GM/177ML PO SOLN: 0.5000 | Freq: Once | ORAL | Status: DC

## 2023-11-17 MED ORDER — FUROSEMIDE 40 MG PO TABS
60.0000 mg | ORAL_TABLET | Freq: Every day | ORAL | Status: DC
  Administered 2023-11-18 – 2023-11-20 (×3): 60 mg via ORAL
  Filled 2023-11-17 (×3): qty 1

## 2023-11-17 MED ORDER — SODIUM CHLORIDE 0.9% IV SOLUTION: Freq: Once | INTRAVENOUS | Status: DC

## 2023-11-17 MED ORDER — SODIUM CHLORIDE 0.9 % IV SOLN
INTRAVENOUS | Status: AC
  Administered 2023-11-17: 10 mL/h via INTRAVENOUS

## 2023-11-17 MED ORDER — ACETAMINOPHEN 325 MG PO TABS: 650.0000 mg | ORAL_TABLET | Freq: Four times a day (QID) | ORAL | Status: DC | PRN

## 2023-11-17 MED ORDER — EZETIMIBE 10 MG PO TABS
10.0000 mg | ORAL_TABLET | Freq: Every day | ORAL | Status: DC
  Administered 2023-11-18 – 2023-11-20 (×3): 10 mg via ORAL
  Filled 2023-11-17 (×3): qty 1

## 2023-11-17 MED ORDER — FUROSEMIDE 40 MG PO TABS: 40.0000 mg | ORAL_TABLET | Freq: Every day | ORAL | Status: DC

## 2023-11-17 MED ORDER — POTASSIUM CHLORIDE CRYS ER 20 MEQ PO TBCR
40.0000 meq | EXTENDED_RELEASE_TABLET | Freq: Once | ORAL | Status: AC
  Administered 2023-11-17: 40 meq via ORAL
  Filled 2023-11-17: qty 4

## 2023-11-17 MED ORDER — NA SULFATE-K SULFATE-MG SULF 17.5-3.13-1.6 GM/177ML PO SOLN
0.5000 | Freq: Once | ORAL | Status: AC
  Administered 2023-11-17: 177 mL via ORAL
  Filled 2023-11-17: qty 1

## 2023-11-17 MED ORDER — AMIODARONE HCL 200 MG PO TABS
200.0000 mg | ORAL_TABLET | Freq: Every day | ORAL | Status: DC
  Administered 2023-11-18 – 2023-11-20 (×3): 200 mg via ORAL
  Filled 2023-11-17 (×3): qty 1

## 2023-11-17 NOTE — H&P (Addendum)
 History and Physical    Patient: Dean Lassen Sr. WUJ:[id number] DOB: [birth year] DOA: 11/17/2023 DOS: the patient was seen and examined on 11/17/2023 PCP: Ralene Ok, MD  Patient coming from: Home  Chief Complaint:  Chief Complaint  Patient presents with   Rectal Bleeding   HPI: Dean Rosko Sr. is a 73 y.o. 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  Patient reports that he was in his usual state of health until yesterday.  He underwent a routine colonoscopy with GI, Dr. Elnoria Howard, yesterday and was noted to have 2 polyps along with diverticulosis in the sigmoid colon.  He had one 2 mm polyp in the ascending colon which was removed with a cold snare and the other was 10 mm polyp in the transverse colon which was removed with a hot snare and subsequently clipped.  He was advised to return home, continue his regular diet, and continue his home Eliquis.  He states that once he got home he had 1 bowel movement with only mild blood noted on the tissue.  Thereafter, he did take his home Eliquis dose in the afternoon.  Since that time, he notes that he has had about 7-8 bowel movements (including today) where he had bright red blood per rectum.  He notes that he had a significant amount of blood in the toilet.  He did not take his evening Eliquis dose and due to persistence of his symptoms, he was brought to the ED for further evaluation.  He denies any abdominal pain, nausea, vomiting, fevers, chills, chest pain, urinary changes.  He does report mild shortness of breath with exertion that began earlier today.  He does report having taken all of his home medications aside from his Eliquis today.  ED course: Vital signs stable.  CBC showing hemoglobin 6.7 (down from around 10 about 6 months ago).  CMP with mild  hypokalemia, creatinine 1.7 (around his baseline of CKD stage IIIb).  He did undergo type and screen and 2 units of PRBCs were ordered for him by the ED provider, have not yet been brought down.  ED provider did speak with GI who will come to evaluate patient later today or tomorrow.  [facility] hospitalist asked to evaluate patient for admission.   Review of Systems: As mentioned in the history of present illness. All other systems reviewed and are negative. 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 09/2016 s/p MDT Visia AF MRI single lead ICD (ser # [id number]).   Arthritis    "maybe in his spine" (09/05/2016)   Asthma    pt. denies   Cardiac arrest (HCC)    a. 08/2016 VT/VF Arrest-->09/2016 s/p MDT Visia AF MRI single lead ICD (ser # [id number]).   Chronic HFrEF (heart failure with reduced ejection fraction) (HCC)    a. 08/2016 Echo: EF 35-40%; b. 04/2018 Echo: EF 30-35%; c. 04/2020 Echo: EF 30-35%; d. 11/2021 Echo: EF 40-45%, GrI DD, nl RV fxn, sev dil LA, mild-mod MR, mild-mod AI, Ao root 38mm.   Coronary artery disease    a. 08/2016 VT Arrest/Cath: LM nl, LAD nl, LCX nl, RGCA 90p, 100d w/ L->R collats, EF 35-45%.   Gout    Hyperlipidemia LDL goal <70    Hypertension    Ischemic cardiomyopathy    a.  08/2016 Echo: EF 35-40%; b. 04/2018 Echo: EF 30-35%; c. 04/2020 Echo: EF 30-35%; d. 11/2021 Echo: EF 40-45%.   LBBB (left bundle branch block)    OSA on CPAP    does not wear CPAP   PAF (paroxysmal atrial fibrillation) (HCC)    a. CHA2DS2VASc = 5-->Eliquis.   Type II diabetes mellitus (HCC)    Past Surgical History:  Procedure Laterality Date   CARDIAC CATHETERIZATION N/A 09/07/2016   Procedure: Left Heart Cath and Coronary Angiography;  Surgeon: Peter M Swaziland, MD;  Location: [facility] INVASIVE CV LAB;  Service: Cardiovascular;  Laterality: N/A;   COLONOSCOPY WITH PROPOFOL N/A 08/30/2018   Procedure: COLONOSCOPY WITH PROPOFOL;  Surgeon:  Jeani Hawking, MD;  Location: [facility] ENDOSCOPY;  Service: Endoscopy;  Laterality: N/A;   EP IMPLANTABLE DEVICE N/A 10/10/2016   Procedure: ICD Implant;  Surgeon: Will Jorja Loa, MD;  Location: [facility] INVASIVE CV LAB;  Service: Cardiovascular;  Laterality: N/A;   LUMBAR LAMINECTOMY/DECOMPRESSION MICRODISCECTOMY N/A 01/18/2018   Procedure: L2-3, L3-4, L-4-5 CENTRAL DECOMPRESSION;  Surgeon: Eldred Manges, MD;  Location: [facility] OR;  Service: Orthopedics;  Laterality: N/A;   LUMBAR LAMINECTOMY/DECOMPRESSION MICRODISCECTOMY N/A 08/12/2020   Procedure: THORACIC ELEVEN LAMINECTOMY;  Surgeon: Julio Sicks, MD;  Location: [facility] OR;  Service: Neurosurgery;  Laterality: N/A;   POLYPECTOMY  08/30/2018   Procedure: POLYPECTOMY;  Surgeon: Jeani Hawking, MD;  Location: [facility] ENDOSCOPY;  Service: Endoscopy;;   Social History:  reports that he has never smoked. He has never used smokeless tobacco. He reports that he does not drink alcohol and does not use drugs.  Allergies  Allergen Reactions   Contrast Media [Iodinated Contrast Media] Shortness Of Breath and Nausea And Vomiting   Lisinopril Cough    Family History  Problem Relation Age of Onset   Heart attack Father        died in his 23's   Hypertension Sister    Cancer Brother        uncertain, "leg"   Hypertension Sister     Prior to Admission medications   Medication Sig Start Date End Date Taking? Authorizing Provider  allopurinol (ZYLOPRIM) 300 MG tablet Take 900 mg by mouth daily. 06/13/23   [provider]  amiodarone (PACERONE) 200 MG tablet Take 1 tablet (200 mg total) by mouth daily. 09/06/22   Sheilah Pigeon, PA-C  atorvastatin (LIPITOR) 40 MG tablet Take 1 tablet (40 mg total) by mouth daily at 6 PM. Patient taking differently: Take 40 mg by mouth daily. 10/27/16   Camnitz, Andree Coss, MD  benzonatate (TESSALON) 200 MG capsule Take 200 mg by mouth 3 (three) times daily as needed. 01/24/23   [provider]  carvedilol (COREG) 12.5 MG  tablet TAKE 1 TABLET (12.5MG  TOTAL) BY MOUTH TWICE A DAY WITH MEALS 08/28/23   Sheilah Pigeon, PA-C  cholecalciferol (VITAMIN D3) 25 MCG (1000 UNIT) tablet Take 1,000 Units by mouth daily. 06/13/23   [provider]  ELIQUIS 5 MG TABS tablet TAKE 1 TABLET BY MOUTH TWICE A DAY 04/20/23   Camnitz, Andree Coss, MD  ezetimibe (ZETIA) 10 MG tablet Take 1 tablet (10 mg total) by mouth daily. 05/07/23   Sheilah Pigeon, PA-C  furosemide (LASIX) 40 MG tablet Take 1 tablet (40 mg total) by mouth daily. 09/08/22   Sheilah Pigeon, PA-C  magnesium oxide (MAG-OX) 400 (240 Mg) MG tablet TAKE 1 TABLET BY MOUTH EVERY DAY 09/06/22   Hilty, Lisette Abu, MD  spironolactone (ALDACTONE) 25 MG  tablet Take 0.5 tablets (12.5 mg total) by mouth daily. 11/09/23   Ronney Asters, NP  valsartan (DIOVAN) 160 MG tablet Take 1 tablet (160 mg total) by mouth daily. 11/09/23   Chrystie Nose, MD    Physical Exam: Vitals:   11/17/23 1311 11/17/23 1556  BP: (!) 143/39   Pulse: 71   Resp: 18   Temp: (!) 97.5 F (36.4 C) 98.3 F (36.8 C)  TempSrc: Oral Oral  SpO2: 93%    Physical Exam Constitutional:      Appearance: He is obese. He is not ill-appearing.  HENT:     Head: Normocephalic and atraumatic.     Mouth/Throat:     Mouth: Mucous membranes are dry.     Pharynx: Oropharynx is clear. No oropharyngeal exudate.  Eyes:     General: No scleral icterus.    Extraocular Movements: Extraocular movements intact.     Conjunctiva/sclera: Conjunctivae normal.     Pupils: Pupils are equal, round, and reactive to light.  Cardiovascular:     Rate and Rhythm: Normal rate and regular rhythm.     Heart sounds:     No friction rub. No gallop.     Comments: Blowing systolic murmur noted. Pulmonary:     Effort: Pulmonary effort is normal. No respiratory distress.     Breath sounds: Normal breath sounds. No wheezing, rhonchi or rales.  Abdominal:     General: Bowel sounds are normal. There is no distension.      Palpations: Abdomen is soft.     Tenderness: There is no abdominal tenderness. There is no guarding or rebound.  Musculoskeletal:        General: Normal range of motion.     Cervical back: Normal range of motion.     Comments: Trace pitting edema in bilateral LE up to mid-shin.  Skin:    General: Skin is warm and dry.  Neurological:     General: No focal deficit present.     Mental Status: He is alert and oriented to person, place, and time.  Psychiatric:        Mood and Affect: Mood normal.        Behavior: Behavior normal.     Data Reviewed:  There are no new results to review at this time.     Latest Ref Rng & Units 11/17/2023    1:43 PM 05/07/2023   10:36 AM 08/26/2022    1:32 AM  CBC  WBC 4.0 - 10.5 K/uL 10.0  8.5  8.1   Hemoglobin 13.0 - 17.0 g/dL 6.7  40.9  9.2   Hematocrit 39.0 - 52.0 % 22.8  33.6  28.5   Platelets 150 - 400 K/uL 163  152  140       Latest Ref Rng & Units 11/17/2023    1:43 PM 05/07/2023   10:36 AM 11/07/2022    2:34 PM  CMP  Glucose 70 - 99 mg/dL 88  811  84   BUN 8 - 23 mg/dL 30  54  42   Creatinine 0.61 - 1.24 mg/dL 9.14  7.82  9.56   Sodium 135 - 145 mmol/L 135  137  142   Potassium 3.5 - 5.1 mmol/L 3.1  5.4  4.8   Chloride 98 - 111 mmol/L 105  107  105   CO2 22 - 32 mmol/L 20  19  21    Calcium 8.9 - 10.3 mg/dL 8.8  21.3  08.6   Total Protein  6.5 - 8.1 g/dL 5.8  7.2    Total Bilirubin 0.0 - 1.2 mg/dL 0.4  0.5    Alkaline Phos 38 - 126 U/L 54  96    AST 15 - 41 U/L 18  16    ALT 0 - 44 U/L 14  9      Assessment and Plan: No notes have been filed under this hospital service. Service: Hospitalist   Symptomatic anemia 2/2 acute blood loss GI bleed, suspect post polypectomy bleed Diverticulosis Patient presenting after experiencing multiple episodes of hematochezia since yesterday.  He underwent a colonoscopy yesterday with Dr. Elnoria Howard which revealed diverticulosis and 2 polyps that were subsequently removed (1 of which required clipping).   He does report taking 1 dose of his home Eliquis yesterday afternoon after the colonoscopy, has not taken another dose since that time.  He does report mild shortness of breath with exertion that began earlier today, likely due to anemia.  On arrival, hemoglobin noted to be 6.7 which is down from 10.2 about 6 months ago.  He is otherwise hemodynamically stable.  He does report that he has some degree of pre-existing chronic anemia that was attributed to iron deficiency.  States that he was advised to begin iron supplementation at recent nephrology visit a couple of weeks ago.  GI consulted and following.  ED provider has already ordered 2 units of PRBCs. -Appreciate GI assistance -Plan for colonoscopy tomorrow for further evaluation -Clear liquid diet, NPO at midnight -Bowel prep placed by GI for this evening -Hold Eliquis, last dose yesterday afternoon -Trend hemoglobin curve, transfuse as necessary for hemoglobin less than 7 -Currently receiving 2 units PRBCs -SCDs for DVT prophylaxis -Place in observation/progressive  Afib -holding eliquis -Holding home Coreg 12.5 mg twice daily in the setting of GI bleed and intermittently low BP  VT with history of cardiac arrest s/p ICD placement (2017) -continue amiodarone -no complaints of chest pain or palpitations  Hypokalemia -Potassium 3.1 on arrival -Given 40 mEq of oral potassium in the ED -Follow-up potassium and Mg levels tomorrow morning  CKD 3B -Kidney function at baseline -Avoid nephrotoxic agents as able -Monitor urine output  Controlled T2DM -Last hemoglobin A1c 5.3% in 08/2022 -Blood glucose 88 on CMP today -Not on any medications for diabetes at home -Trend CBGs, goal 140-180 -Follow-up hemoglobin A1c -Can consider adding SSI if CBGs above goal  HTN -BP ranging in the 110s-120s/40s since arrival -holding coreg, valsartan, spironolactone in the setting of GI bleed and intermittently low BP  Chronic combined systolic and  diastolic heart failure Moderate mitral valve regurgitation Moderate aortic valve regurgitation -Echo in 08/2022 with LVEF 35 to 40%, moderately decreased LV function, regional wall motion abnormalities, grade 1 diastolic dysfunction, moderate mitral valve regurgitation, moderate aortic valve regurgitation. -He is fairly euvolemic on exam, with only trace edema in bilateral LE -Resume home Lasix 60 mg daily -Holding home spironolactone 25 mg daily, valsartan 160 mg daily, Coreg 12.5 mg twice daily in the setting of GI bleed   -Can resume home antihypertensives tomorrow after definitive intervention with GI  HLD -continue lipitor and zetia    Advance Care Planning:   Code Status: Full Code   Consults: GI  Family Communication: no family at bedside  Severity of Illness: The appropriate patient status for this patient is OBSERVATION. Observation status is judged to be reasonable and necessary in order to provide the required intensity of service to ensure the patient's safety. The patient's presenting symptoms, physical exam  findings, and initial radiographic and laboratory data in the context of their medical condition is felt to place them at decreased risk for further clinical deterioration. Furthermore, it is anticipated that the patient will be medically stable for discharge from the hospital within 2 midnights of admission.   Portions of this note were generated with Scientist, clinical (histocompatibility and immunogenetics). Dictation errors may occur despite best attempts at proofreading.   Author: Briscoe Burns, MD 11/17/2023 3:59 PM  For on call review [email or web address].

## 2023-11-17 NOTE — ED Provider Notes (Signed)
 [place] EMERGENCY DEPARTMENT AT [facility] Provider Note   CSN: [id number] Arrival date & time: 11/17/23  1302     History  Chief Complaint  Patient presents with   Rectal Bleeding    Dean Hargadon Sr. is a 73 y.o. male.   Rectal Bleeding    Patient has a history of hypertension gout ventricular fibrillation and tachycardia, hyperlipidemia, diabetes, heart failure, coronary artery disease, paroxysmal atrial fibrillation who presents to the ED for evaluation of rectal bleeding.  Patient had a colonoscopy procedure yesterday.  He states he had a polyp removed.  Patient takes Eliquis.  He was told that he could start taking it again yesterday so he did take a dose yesterday.  When he started noticing rectal bleeding he consulted with Dr.  He was instructed to not take his dose today.  He called the GI doctor on-call covering for Dr. Elnoria Howard and was instructed to come to the ED because of his persistent rectal bleeding.  Patient states he has been passing some clots.  He is not having any trouble with lightheadedness or dizziness.  No abdominal pain.  No fever.  Home Medications Prior to Admission medications   Medication Sig Start Date End Date Taking? Authorizing Provider  allopurinol (ZYLOPRIM) 300 MG tablet Take 900 mg by mouth daily. 06/13/23   [provider]  amiodarone (PACERONE) 200 MG tablet Take 1 tablet (200 mg total) by mouth daily. 09/06/22   Sheilah Pigeon, PA-C  atorvastatin (LIPITOR) 40 MG tablet Take 1 tablet (40 mg total) by mouth daily at 6 PM. Patient taking differently: Take 40 mg by mouth daily. 10/27/16   Camnitz, Andree Coss, MD  benzonatate (TESSALON) 200 MG capsule Take 200 mg by mouth 3 (three) times daily as needed. 01/24/23   [provider]  carvedilol (COREG) 12.5 MG tablet TAKE 1 TABLET (12.5MG  TOTAL) BY MOUTH TWICE A DAY WITH MEALS 08/28/23   Sheilah Pigeon, PA-C  cholecalciferol (VITAMIN D3) 25 MCG (1000 UNIT) tablet Take  1,000 Units by mouth daily. 06/13/23   [provider]  ELIQUIS 5 MG TABS tablet TAKE 1 TABLET BY MOUTH TWICE A DAY 04/20/23   Camnitz, Andree Coss, MD  ezetimibe (ZETIA) 10 MG tablet Take 1 tablet (10 mg total) by mouth daily. 05/07/23   Sheilah Pigeon, PA-C  furosemide (LASIX) 40 MG tablet Take 1 tablet (40 mg total) by mouth daily. 09/08/22   Sheilah Pigeon, PA-C  magnesium oxide (MAG-OX) 400 (240 Mg) MG tablet TAKE 1 TABLET BY MOUTH EVERY DAY 09/06/22   Hilty, Lisette Abu, MD  spironolactone (ALDACTONE) 25 MG tablet Take 0.5 tablets (12.5 mg total) by mouth daily. 11/09/23   Ronney Asters, NP  valsartan (DIOVAN) 160 MG tablet Take 1 tablet (160 mg total) by mouth daily. 11/09/23   Chrystie Nose, MD      Allergies    Contrast media [iodinated contrast media] and Lisinopril    Review of Systems   Review of Systems  Gastrointestinal:  Positive for hematochezia.    Physical Exam Updated Vital Signs BP (!) 143/39 (BP Location: Left Arm)   Pulse 71   Temp (!) 97.5 F (36.4 C) (Oral)   Resp 18   SpO2 93%  Physical Exam Vitals and nursing note reviewed.  Constitutional:      General: He is not in acute distress.    Appearance: He is well-developed.  HENT:     Head: Normocephalic and atraumatic.  Right Ear: External ear normal.     Left Ear: External ear normal.  Eyes:     General: No scleral icterus.       Right eye: No discharge.        Left eye: No discharge.     Conjunctiva/sclera: Conjunctivae normal.  Neck:     Trachea: No tracheal deviation.  Cardiovascular:     Rate and Rhythm: Normal rate and regular rhythm.  Pulmonary:     Effort: Pulmonary effort is normal. No respiratory distress.     Breath sounds: Normal breath sounds. No stridor. No wheezing or rales.  Abdominal:     General: Bowel sounds are normal. There is no distension.     Palpations: Abdomen is soft.     Tenderness: There is no abdominal tenderness. There is no guarding or rebound.   Genitourinary:    Comments: Blood noted in the perianal area Musculoskeletal:        General: No tenderness or deformity.     Cervical back: Neck supple.  Skin:    General: Skin is warm and dry.     Findings: No rash.  Neurological:     General: No focal deficit present.     Mental Status: He is alert.     Cranial Nerves: No cranial nerve deficit, dysarthria or facial asymmetry.     Sensory: No sensory deficit.     Motor: No abnormal muscle tone or seizure activity.     Coordination: Coordination normal.  Psychiatric:        Mood and Affect: Mood normal.     ED Results / Procedures / Treatments   Labs (all labs ordered are listed, but only abnormal results are displayed) Labs Reviewed  CBC WITH DIFFERENTIAL/PLATELET - Abnormal; Notable for the following components:      Result Value   RBC 2.38 (*)    Hemoglobin 6.7 (*)    HCT 22.8 (*)    MCHC 29.4 (*)    RDW 15.9 (*)    Eosinophils Absolute 1.0 (*)    All other components within normal limits  COMPREHENSIVE METABOLIC PANEL - Abnormal; Notable for the following components:   Potassium 3.1 (*)    CO2 20 (*)    BUN 30 (*)    Creatinine, Ser 1.70 (*)    Calcium 8.8 (*)    Total Protein 5.8 (*)    Albumin 3.0 (*)    GFR, Estimated 42 (*)    All other components within normal limits  TYPE AND SCREEN  PREPARE RBC (CROSSMATCH)    EKG None  Radiology No results found.  Procedures .Critical Care  Performed by: Linwood Dibbles, MD Authorized by: Linwood Dibbles, MD   Critical care provider statement:    Critical care time (minutes):  30   Critical care was time spent personally by me on the following activities:  Development of treatment plan with patient or surrogate, discussions with consultants, evaluation of patient's response to treatment, examination of patient, ordering and review of laboratory studies, ordering and review of radiographic studies, ordering and performing treatments and interventions, pulse oximetry,  re-evaluation of patient's condition and review of old charts     Medications Ordered in ED Medications  0.9 %  sodium chloride infusion (Manually program via Guardrails IV Fluids) (has no administration in time range)  potassium chloride SA (KLOR-CON M) CR tablet 40 mEq (has no administration in time range)    ED Course/ Medical Decision Making/ A&P Clinical Course as of  11/17/23 1531  Sat Nov 17, 2023  1517 Cased discussed with Dr Kyung Rudd.  Will see him in consultation.  If condition changes please call back. [JK]  1528 Case discussed with Dr Austin Miles [JK]  1531 CBC with Differential(!!) Hgb stable [JK]  1531 Comprehensive metabolic panel(!) Renal function stable [JK]    Clinical Course User Index [JK] Linwood Dibbles, MD                                 Medical Decision Making Problems Addressed: Gastrointestinal hemorrhage, unspecified gastrointestinal hemorrhage type: acute illness or injury that poses a threat to life or bodily functions  Amount and/or Complexity of Data Reviewed Labs: ordered. Decision-making details documented in ED Course.  Risk Prescription drug management. Decision regarding hospitalization.   Patient presented to the ED for evaluation of rectal bleeding.  Patient had a colonoscopy yesterday.  He had 2 polyps removed.  Patient started having rectal bleeding.  He has a significant drop in his hemoglobin but at this time is otherwise hemodynamically stable.  Is not hypotensive or tachycardic.  He has not been feeling lightheaded or dizzy.  Patient's renal function is stable compared to previous values.  Mild hypokalemia noted will give him oral potassium replacement.  Hemoglobin has dropped down to 6.7.  I have ordered 2 units of blood.  Consulted with gastroenterology Dr. Riley Kill who is covering for Dr. Elnoria Howard.  They will see patient in consultation.  Also spoke with Dr. Austin Miles regarding admission.        Final Clinical Impression(s) / ED  Diagnoses Final diagnoses:  Gastrointestinal hemorrhage, unspecified gastrointestinal hemorrhage type    Rx / DC Orders ED Discharge Orders     None         Linwood Dibbles, MD 11/17/23 1531

## 2023-11-17 NOTE — Consult Note (Signed)
 Referring Provider: Dr. Austin Miles         Primary Care Physician:  Ralene Ok, MD Primary Gastroenterologist: Dr. Elnoria Howard            Reason for Consultation:   Hematochezia                ASSESSMENT /  PLAN    Hematochezia, suspected post polypectomy bleed Anemia Patient started developing rectal bleeding after his colonoscopy yesterday when he had 2 polyps removed.  I suspect that he is most likely having post polypectomy bleeding from the larger polyp that was removed, which required placement of 2 clips yesterday already.  His hemoglobin has dropped from 10.2 in 04/2023 to 6.7 upon arrival.  Will plan for colonoscopy tomorrow for further evaluation and potential treatment of his rectal bleeding -Continue to trend blood counts.  Transfuse as necessary for hemoglobin less than 7 -Continue to hold Eliquis for now.  Last dose was yesterday afternoon. -Plan for colonoscopy tomorrow for further evaluation.  Placed bowel prep for this evening   HPI:     Dean Livesay Sr. is a 73 y.o. male with history of arthritis, asthma, HFrEF with EF of 35-40% and grade 1 diastolic dysfunction, CAD s/p PCI, cardiac arrest s/p ICD placement and 2018, OSA on CPAP, LBBB, atrial fibrillation on Eliquis, type 2 diabetes presents with hematochezia.  He recently just had a colonoscopy yesterday where he had 2 colon polyps removed and was noted to have diverticulosis.  The larger colon polyp was removed with hot snare and required 2 clips to be placed to prevent post polypectomy bleeding.  Starting around 1 PM yesterday he started developing rectal bleeding.  He thought that this rectal bleeding may resolve on its own.  He contacted his PCP who instructed him to go ahead and take his Eliquis therapy.  His last dose of Eliquis was yesterday at around 1 PM.  Unfortunately his rectal bleeding has continued.  He just passed another episode of rectal bleeding in the last hour.  He has been consuming solid foods following  his procedure and had breakfast this morning as well.  Denies any abdominal pain.  He does feel bloated.  Denies any lightheadedness.  Might feel slightly short of breath.  Denies any chest pain.  Denies any fevers.  Denies nausea, vomiting.  He has had ongoing diarrhea in his stools.  Colonoscopy 11/16/23: - One 2 mm polyp in the ascending colon, removed with a cold snare. Resected and retrieved. - One 10 mm polyp in the transverse colon, removed with a hot snare. Resected and retrieved. Clip manufacturer: [employer]. Clips ( MR safe) were placed. - Diverticulosis in the sigmoid colon.    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 09/2016 s/p MDT Visia AF MRI single lead ICD (ser # [id number]).   Arthritis    "maybe in his spine" (09/05/2016)   Asthma    pt. denies   Cardiac arrest (HCC)    a. 08/2016 VT/VF Arrest-->09/2016 s/p MDT Visia AF MRI single lead ICD (ser # [id number]).   Chronic HFrEF (heart failure with reduced ejection fraction) (HCC)    a. 08/2016 Echo: EF 35-40%; b. 04/2018 Echo: EF 30-35%; c. 04/2020 Echo: EF 30-35%; d. 11/2021 Echo: EF 40-45%, GrI DD, nl RV fxn, sev dil LA, mild-mod MR, mild-mod AI, Ao root 38mm.   Coronary artery disease    a. 08/2016 VT Arrest/Cath: LM nl, LAD nl,  LCX nl, RGCA 90p, 100d w/ L->R collats, EF 35-45%.   Gout    Hyperlipidemia LDL goal <70    Hypertension    Ischemic cardiomyopathy    a. 08/2016 Echo: EF 35-40%; b. 04/2018 Echo: EF 30-35%; c. 04/2020 Echo: EF 30-35%; d. 11/2021 Echo: EF 40-45%.   LBBB (left bundle branch block)    OSA on CPAP    does not wear CPAP   PAF (paroxysmal atrial fibrillation) (HCC)    a. CHA2DS2VASc = 5-->Eliquis.   Type II diabetes mellitus (HCC)     Past Surgical History:  Procedure Laterality Date   CARDIAC CATHETERIZATION N/A 09/07/2016   Procedure: Left Heart Cath and Coronary Angiography;  Surgeon: Peter M Swaziland, MD;  Location: [facility]  INVASIVE CV LAB;  Service: Cardiovascular;  Laterality: N/A;   COLONOSCOPY WITH PROPOFOL N/A 08/30/2018   Procedure: COLONOSCOPY WITH PROPOFOL;  Surgeon: Jeani Hawking, MD;  Location: [facility] ENDOSCOPY;  Service: Endoscopy;  Laterality: N/A;   EP IMPLANTABLE DEVICE N/A 10/10/2016   Procedure: ICD Implant;  Surgeon: Will Jorja Loa, MD;  Location: [facility] INVASIVE CV LAB;  Service: Cardiovascular;  Laterality: N/A;   LUMBAR LAMINECTOMY/DECOMPRESSION MICRODISCECTOMY N/A 01/18/2018   Procedure: L2-3, L3-4, L-4-5 CENTRAL DECOMPRESSION;  Surgeon: Eldred Manges, MD;  Location: [facility] OR;  Service: Orthopedics;  Laterality: N/A;   LUMBAR LAMINECTOMY/DECOMPRESSION MICRODISCECTOMY N/A 08/12/2020   Procedure: THORACIC ELEVEN LAMINECTOMY;  Surgeon: Julio Sicks, MD;  Location: [facility] OR;  Service: Neurosurgery;  Laterality: N/A;   POLYPECTOMY  08/30/2018   Procedure: POLYPECTOMY;  Surgeon: Jeani Hawking, MD;  Location: [facility] ENDOS[facility];  Service: Endoscopy;;    Prior to Admission medications   Medication Sig Start Date End Date Taking? Authorizing Provider  allopurinol (ZYLOPRIM) 300 MG tablet Take 300 mg by mouth daily. 06/13/23  Yes [provider]  amiodarone (PACERONE) 200 MG tablet Take 1 tablet (200 mg total) by mouth daily. 09/06/22  Yes Sheilah Pigeon, PA-C  atorvastatin (LIPITOR) 40 MG tablet Take 1 tablet (40 mg total) by mouth daily at 6 PM. Patient taking differently: Take 40 mg by mouth daily. 10/27/16  Yes Camnitz, Will Daphine Deutscher, MD  carvedilol (COREG) 12.5 MG tablet TAKE 1 TABLET (12.5MG  TOTAL) BY MOUTH TWICE A DAY WITH MEALS 08/28/23  Yes Sheilah Pigeon, PA-C  cholecalciferol (VITAMIN D3) 25 MCG (1000 UNIT) tablet Take 1,000 Units by mouth daily. 06/13/23  Yes [provider]  ELIQUIS 5 MG TABS tablet TAKE 1 TABLET BY MOUTH TWICE A DAY 04/20/23  Yes Camnitz, Will Daphine Deutscher, MD  ezetimibe (ZETIA) 10 MG tablet Take 1 tablet (10 mg total) by mouth daily. 05/07/23  Yes Sheilah Pigeon, PA-C  ferrous  sulfate 325 (65 FE) MG tablet Take 325 mg by mouth daily. 11/08/23  Yes [provider]  furosemide (LASIX) 20 MG tablet Take 20 mg by mouth daily. Takes along with 40 mg to total 60 mg daily   Yes [provider]  furosemide (LASIX) 40 MG tablet Take 1 tablet (40 mg total) by mouth daily. Patient taking differently: Take 40 mg by mouth daily. Takes along with 20 mg to total 60 mg 09/08/22  Yes Sheilah Pigeon, PA-C  spironolactone (ALDACTONE) 25 MG tablet Take 0.5 tablets (12.5 mg total) by mouth daily. Patient taking differently: Take 25 mg by mouth daily. 11/09/23  Yes Ronney Asters, NP  valsartan (DIOVAN) 160 MG tablet Take 1 tablet (160 mg total) by mouth daily. 11/09/23  Yes Hilty, Lisette Abu, MD  magnesium oxide (MAG-OX) 400 (240 Mg) MG tablet TAKE 1 TABLET BY MOUTH EVERY DAY Patient not taking: Reported on 11/17/2023 09/06/22   Chrystie Nose, MD    Current Facility-Administered Medications  Medication Dose Route Frequency Provider Last Rate Last Admin   0.9 %  sodium chloride infusion (Manually program via Guardrails IV Fluids)   Intravenous Once Linwood Dibbles, MD       0.9 %  sodium chloride infusion   Intravenous Continuous Imogene Burn, MD       acetaminophen (TYLENOL) tablet 650 mg  650 mg Oral Q6H PRN Briscoe Burns, MD       Or   acetaminophen (TYLENOL) suppository 650 mg  650 mg Rectal Q6H PRN Briscoe Burns, MD       [START ON 11/18/2023] amiodarone (PACERONE) tablet 200 mg  200 mg Oral Daily Briscoe Burns, MD       [START ON 11/18/2023] atorvastatin (LIPITOR) tablet 40 mg  40 mg Oral Daily Briscoe Burns, MD       [START ON 11/18/2023] ezetimibe (ZETIA) tablet 10 mg  10 mg Oral Daily Briscoe Burns, MD       [START ON 11/18/2023] furosemide (LASIX) tablet 60 mg  60 mg Oral Daily Briscoe Burns, MD       Na Sulfate-K Sulfate-Mg Sulfate concentrate (SUPREP) kit 177 mL  0.5 kit Oral Once Imogene Burn, MD       Followed by   Melene Muller ON 11/18/2023] Na  Sulfate-K Sulfate-Mg Sulfate concentrate (SUPREP) kit 177 mL  0.5 kit Oral Once Imogene Burn, MD       potassium chloride SA (KLOR-CON M) CR tablet 40 mEq  40 mEq Oral Once Linwood Dibbles, MD       Current Outpatient Medications  Medication Sig Dispense Refill   allopurinol (ZYLOPRIM) 300 MG tablet Take 300 mg by mouth daily.     amiodarone (PACERONE) 200 MG tablet Take 1 tablet (200 mg total) by mouth daily. 90 tablet 1   atorvastatin (LIPITOR) 40 MG tablet Take 1 tablet (40 mg total) by mouth daily at 6 PM. (Patient taking differently: Take 40 mg by mouth daily.) 30 tablet 5   carvedilol (COREG) 12.5 MG tablet TAKE 1 TABLET (12.5MG  TOTAL) BY MOUTH TWICE A DAY WITH MEALS 180 tablet 3   cholecalciferol (VITAMIN D3) 25 MCG (1000 UNIT) tablet Take 1,000 Units by mouth daily.     ELIQUIS 5 MG TABS tablet TAKE 1 TABLET BY MOUTH TWICE A DAY 180 tablet 2   ezetimibe (ZETIA) 10 MG tablet Take 1 tablet (10 mg total) by mouth daily. 90 tablet 2   ferrous sulfate 325 (65 FE) MG tablet Take 325 mg by mouth daily.     furosemide (LASIX) 20 MG tablet Take 20 mg by mouth daily. Takes along with 40 mg to total 60 mg daily     furosemide (LASIX) 40 MG tablet Take 1 tablet (40 mg total) by mouth daily. (Patient taking differently: Take 40 mg by mouth daily. Takes along with 20 mg to total 60 mg)     spironolactone (ALDACTONE) 25 MG tablet Take 0.5 tablets (12.5 mg total) by mouth daily. (Patient taking differently: Take 25 mg by mouth daily.) 45 tablet 3   valsartan (DIOVAN) 160 MG tablet Take 1 tablet (160 mg total) by mouth daily. 90 tablet 2   magnesium oxide (MAG-OX) 400 (240 Mg) MG tablet TAKE 1 TABLET BY MOUTH EVERY DAY (Patient  not taking: Reported on 11/17/2023) 90 tablet 0    Allergies as of 11/17/2023 - Review Complete 11/17/2023  Allergen Reaction Noted   Contrast media [iodinated contrast media] Shortness Of Breath and Nausea And Vomiting 10/10/2016   Lisinopril Cough 09/19/2016    Family History   Problem Relation Age of Onset   Heart attack Father        died in his 42's   Hypertension Sister    Cancer Brother        uncertain, "leg"   Hypertension Sister     Social History   Tobacco Use   Smoking status: Never   Smokeless tobacco: Never  Vaping Use   Vaping status: Never Used  Substance Use Topics   Alcohol use: No   Drug use: No    Review of Systems: All systems reviewed and negative except where noted in HPI.  Physical Exam: Vital signs in last 24 hours: Temp:  [97.5 F (36.4 C)-98.3 F (36.8 C)] 98.3 F (36.8 C) (02/22 1713) Pulse Rate:  [56-71] 56 (02/22 1730) Resp:  [18-19] 19 (02/22 1730) BP: (110-143)/(39-51) 126/48 (02/22 1730) SpO2:  [93 %-100 %] 100 % (02/22 1730)   General:   Awake, alert, NAD Psych:  Pleasant, cooperative. Normal mood and affect. Eyes:  Pupils equal, sclera clear, no icterus.    Neck:  Supple; no masses Lungs:  Clear throughout to auscultation Heart:  Regular rate Abdomen:  Soft, non-distended, nontender Msk:  Symmetrical without gross deformities. . Neurologic:  Alert and  oriented x4;  grossly normal neurologically. Skin:  Intact without significant lesions or rashes.   Intake/Output from previous day: No intake/output data recorded. Intake/Output this shift: No intake/output data recorded.  Lab Results: Recent Labs    11/17/23 1343  WBC 10.0  HGB 6.7*  HCT 22.8*  PLT 163   BMET Recent Labs    11/17/23 1343  NA 135  K 3.1*  CL 105  CO2 20*  GLUCOSE 88  BUN 30*  CREATININE 1.70*  CALCIUM 8.8*   LFT Recent Labs    11/17/23 1343  PROT 5.8*  ALBUMIN 3.0*  AST 18  ALT 14  ALKPHOS 54  BILITOT 0.4   PT/INR Recent Labs    11/17/23 1720  LABPROT 20.7*  INR 1.8*   Hepatitis Panel No results for input(s): "HEPBSAG", "HCVAB", "HEPAIGM", "HEPBIGM" in the last 72 hours.   .    Latest Ref Rng & Units 11/17/2023    1:43 PM 05/07/2023   10:36 AM 08/26/2022    1:32 AM  CBC  WBC 4.0 - 10.5 K/uL  10.0  8.5  8.1   Hemoglobin 13.0 - 17.0 g/dL 6.7  16.1  9.2   Hematocrit 39.0 - 52.0 % 22.8  33.6  28.5   Platelets 150 - 400 K/uL 163  152  140     .    Latest Ref Rng & Units 11/17/2023    1:43 PM 05/07/2023   10:36 AM 11/07/2022    2:34 PM  CMP  Glucose 70 - 99 mg/dL 88  096  84   BUN 8 - 23 mg/dL 30  54  42   Creatinine 0.61 - 1.24 mg/dL 0.45  4.09  8.11   Sodium 135 - 145 mmol/L 135  137  142   Potassium 3.5 - 5.1 mmol/L 3.1  5.4  4.8   Chloride 98 - 111 mmol/L 105  107  105   CO2 22 - 32 mmol/L 20  19  21   Calcium 8.9 - 10.3 mg/dL 8.8  93.2  35.5   Total Protein 6.5 - 8.1 g/dL 5.8  7.2    Total Bilirubin 0.0 - 1.2 mg/dL 0.4  0.5    Alkaline Phos 38 - 126 U/L 54  96    AST 15 - 41 U/L 18  16    ALT 0 - 44 U/L 14  9     Studies/Results: No results found.  Principal Problem:   Acute GI bleeding Active Problems:   Type 2 diabetes mellitus with obesity (HCC)   ICD (implantable cardioverter-defibrillator) in place   CKD (chronic kidney disease), stage III (HCC)   Chronic combined systolic and diastolic CHF (congestive heart failure) (HCC)   Atrial fibrillation, chronic (HCC)    Eulah Pont, M.D. @  11/17/2023, 6:22 PM

## 2023-11-17 NOTE — ED Notes (Signed)
 ED TO INPATIENT HANDOFF REPORT  ED Nurse Name and Phone #: Alauna Hayden RN   S Name/Age/Gender Dean Jasper Sr. 73 y.o. male Room/Bed: WA24/WA24  Code Status   Code Status: Full Code  Home/SNF/Other Home Patient oriented to: self, place, time, and situation Is this baseline? Yes   Triage Complete: Triage complete  Chief Complaint Acute GI bleeding [K92.2]  Triage Note Pt reports bright red bleeding when going to the bathroom since yesterday. Pt reports having a colonoscopy done yesterday.    Allergies Allergies  Allergen Reactions   Contrast Media [Iodinated Contrast Media] Shortness Of Breath and Nausea And Vomiting   Lisinopril Cough    Level of Care/Admitting Diagnosis ED Disposition     ED Disposition  Admit   Condition  --   Comment  Hospital Area: [facility] [[id number]]  Level of Care: Progressive [102]  Admit to Progressive based on following criteria: GI, ENDOCRINE disease patients with GI bleeding, acute liver failure or pancreatitis, stable with diabetic ketoacidosis or thyrotoxicosis (hypothyroid) state.  May place patient in observation at [facility] or [facility] if equivalent level of care is available:: No  Covid Evaluation: Asymptomatic - no recent exposure (last 10 days) testing not required  Diagnosis: Acute GI bleeding [161096]  Admitting Physician: Briscoe Burns [[id number]]  Attending Physician: Briscoe Burns [[id number]]          B Medical/Surgery History Past Medical History:  Diagnosis Date   AICD (automatic cardioverter/defibrillator) present 10/10/2016   a. 09/2016 s/p MDT Visia AF MRI single lead ICD (ser # [id number]).   Arthritis    "maybe in his spine" (09/05/2016)   Asthma    pt. denies   Cardiac arrest (HCC)    a. 08/2016 VT/VF Arrest-->09/2016 s/p MDT Visia AF MRI single lead ICD (ser # [id number]).   Chronic HFrEF (heart failure with reduced ejection fraction) (HCC)    a. 08/2016 Echo: EF 35-40%; b.  04/2018 Echo: EF 30-35%; c. 04/2020 Echo: EF 30-35%; d. 11/2021 Echo: EF 40-45%, GrI DD, nl RV fxn, sev dil LA, mild-mod MR, mild-mod AI, Ao root 38mm.   Coronary artery disease    a. 08/2016 VT Arrest/Cath: LM nl, LAD nl, LCX nl, RGCA 90p, 100d w/ L->R collats, EF 35-45%.   Gout    Hyperlipidemia LDL goal <70    Hypertension    Ischemic cardiomyopathy    a. 08/2016 Echo: EF 35-40%; b. 04/2018 Echo: EF 30-35%; c. 04/2020 Echo: EF 30-35%; d. 11/2021 Echo: EF 40-45%.   LBBB (left bundle branch block)    OSA on CPAP    does not wear CPAP   PAF (paroxysmal atrial fibrillation) (HCC)    a. CHA2DS2VASc = 5-->Eliquis.   Type II diabetes mellitus (HCC)    Past Surgical History:  Procedure Laterality Date   CARDIAC CATHETERIZATION N/A 09/07/2016   Procedure: Left Heart Cath and Coronary Angiography;  Surgeon: Peter M Swaziland, MD;  Location: [facility] INVASIVE CV LAB;  Service: Cardiovascular;  Laterality: N/A;   COLONOSCOPY WITH PROPOFOL N/A 08/30/2018   Procedure: COLONOSCOPY WITH PROPOFOL;  Surgeon: Jeani Hawking, MD;  Location: [facility] ENDOSCOPY;  Service: Endoscopy;  Laterality: N/A;   EP IMPLANTABLE DEVICE N/A 10/10/2016   Procedure: ICD Implant;  Surgeon: Will Jorja Loa, MD;  Location: [facility] INVASIVE CV LAB;  Service: Cardiovascular;  Laterality: N/A;   LUMBAR LAMINECTOMY/DECOMPRESSION MICRODISCECTOMY N/A 01/18/2018   Procedure: L2-3, L3-4, L-4-5 CENTRAL DECOMPRESSION;  Surgeon: Eldred Manges, MD;  Location: [facility] OR;  Service: Orthopedics;  Laterality: N/A;   LUMBAR LAMINECTOMY/DECOMPRESSION MICRODISCECTOMY N/A 08/12/2020   Procedure: THORACIC ELEVEN LAMINECTOMY;  Surgeon: Julio Sicks, MD;  Location: [facility] OR;  Service: Neurosurgery;  Laterality: N/A;   POLYPECTOMY  08/30/2018   Procedure: POLYPECTOMY;  Surgeon: Jeani Hawking, MD;  Location: [facility];  Service: Endoscopy;;     A IV Location/Drains/Wounds Patient Lines/Drains/Airways Status     Active Line/Drains/Airways     Name Placement date Placement  time Site Days   Peripheral IV 11/17/23 20 G Anterior;Left Forearm 11/17/23  1456  Forearm  less than 1   Wound / Incision (Open or Dehisced) 08/25/20 (MASD) Moisture Associated Skin Damage;Non-pressure wound Buttocks Right;Left;Medial Red, pink 08/25/20  1000  Buttocks  1179            Intake/Output Last 24 hours No intake or output data in the 24 hours ending 11/17/23 1738  Labs/Imaging Results for orders placed or performed during the hospital encounter of 11/17/23 (from the past 48 hours)  Type and screen [facility]     Status: None (Preliminary result)   Collection Time: 11/17/23  1:33 PM  Result Value Ref Range   ABO/RH(D) O POS    Antibody Screen NEG    Sample Expiration 11/20/2023,2359    Unit Number [id number]    Blood Component Type RED CELLS,LR    Unit division 00    Status of Unit ALLOCATED    Transfusion Status OK TO TRANSFUSE    Crossmatch Result Compatible    Unit Number [id number]    Blood Component Type RED CELLS,LR    Unit division 00    Status of Unit ISSUED    Transfusion Status OK TO TRANSFUSE    Crossmatch Result      Compatible Performed at [facility], [address], [place], [place] [address]   CBC with Differential     Status: Abnormal   Collection Time: 11/17/23  1:43 PM  Result Value Ref Range   WBC 10.0 4.0 - 10.5 K/uL   RBC 2.38 (L) 4.22 - 5.81 MIL/uL   Hemoglobin 6.7 (LL) 13.0 - 17.0 g/dL    Comment: REPEATED TO VERIFY THIS CRITICAL RESULT HAS VERIFIED AND BEEN CALLED TO K.GARNER,PARAMEDIC BY ATCHISON,MARY ON 02 22 2025 AT 1405, AND HAS BEEN READ BACK.     HCT 22.8 (L) 39.0 - 52.0 %   MCV 95.8 80.0 - 100.0 fL   MCH 28.2 26.0 - 34.0 pg   MCHC 29.4 (L) 30.0 - 36.0 g/dL   RDW 78.4 (H) 69.6 - 29.5 %   Platelets 163 150 - 400 K/uL   nRBC 0.0 0.0 - 0.2 %   Neutrophils Relative % 68 %   Neutro Abs 6.9 1.7 - 7.7 K/uL   Lymphocytes Relative 14 %   Lymphs Abs 1.4 0.7 - 4.0 K/uL   Monocytes  Relative 7 %   Monocytes Absolute 0.7 0.1 - 1.0 K/uL   Eosinophils Relative 10 %   Eosinophils Absolute 1.0 (H) 0.0 - 0.5 K/uL   Basophils Relative 0 %   Basophils Absolute 0.0 0.0 - 0.1 K/uL   Immature Granulocytes 1 %   Abs Immature Granulocytes 0.07 0.00 - 0.07 K/uL    Comment: Performed at [facility], [address] [place], [place] [address]  Comprehensive metabolic panel     Status: Abnormal   Collection Time: 11/17/23  1:43 PM  Result Value Ref Range   Sodium 135 135 - 145  mmol/L   Potassium 3.1 (L) 3.5 - 5.1 mmol/L   Chloride 105 98 - 111 mmol/L   CO2 20 (L) 22 - 32 mmol/L   Glucose, Bld 88 70 - 99 mg/dL    Comment: Glucose reference range applies only to samples taken after fasting for at least 8 hours.   BUN 30 (H) 8 - 23 mg/dL   Creatinine, Ser 7.51 (H) 0.61 - 1.24 mg/dL   Calcium 8.8 (L) 8.9 - 10.3 mg/dL   Total Protein 5.8 (L) 6.5 - 8.1 g/dL   Albumin 3.0 (L) 3.5 - 5.0 g/dL   AST 18 15 - 41 U/L   ALT 14 0 - 44 U/L   Alkaline Phosphatase 54 38 - 126 U/L   Total Bilirubin 0.4 0.0 - 1.2 mg/dL   GFR, Estimated 42 (L) >60 mL/min    Comment: (NOTE) Calculated using the CKD-EPI Creatinine Equation (2021)    Anion gap 10 5 - 15    Comment: Performed at [facility], [address], [place], [place] [address]  Prepare RBC (crossmatch)     Status: None   Collection Time: 11/17/23  3:07 PM  Result Value Ref Range   Order Confirmation      ORDER PROCESSED BY BLOOD BANK Performed at [facility], [address] [place], [place] [address]    No results found.  Pending Labs Unresulted Labs (From admission, onward)     Start     Ordered   11/18/23 0500  Basic metabolic panel  Tomorrow morning,   R        11/17/23 1553   11/18/23 0500  CBC  Tomorrow morning,   R        11/17/23 1553   11/17/23 1554  Protime-INR  Once,   R        11/17/23 1553   11/17/23 1554  APTT  Once,   R        11/17/23 1553   11/17/23  1554  Iron and TIBC  Once,   R        11/17/23 1553   11/17/23 1554  Ferritin  Once,   R        11/17/23 1553   11/17/23 1553  Hemoglobin A1c  Once,   R        11/17/23 1553            Vitals/Pain Today's Vitals   11/17/23 1556 11/17/23 1600 11/17/23 1652 11/17/23 1713  BP:  (!) 124/39 (!) 110/51 (!) 126/40  Pulse:  (!) 56 (!) 56 (!) 56  Resp:   19 18  Temp: 98.3 F (36.8 C)  98.2 F (36.8 C) 98.3 F (36.8 C)  TempSrc: Oral  Oral Oral  SpO2:  100% 100%   PainSc:        Isolation Precautions No active isolations  Medications Medications  0.9 %  sodium chloride infusion (Manually program via Guardrails IV Fluids) (has no administration in time range)  potassium chloride SA (KLOR-CON M) CR tablet 40 mEq (has no administration in time range)  acetaminophen (TYLENOL) tablet 650 mg (has no administration in time range)    Or  acetaminophen (TYLENOL) suppository 650 mg (has no administration in time range)  amiodarone (PACERONE) tablet 200 mg (has no administration in time range)  atorvastatin (LIPITOR) tablet 40 mg (has no administration in time range)  ezetimibe (ZETIA) tablet 10 mg (has no administration in time range)  furosemide (LASIX) tablet 40  mg (has no administration in time range)    Mobility walks     Focused Assessments Cardiac Assessment Handoff:  Cardiac Rhythm: Sinus bradycardia Lab Results  Component Value Date   TROPONINI 0.07 (HH) 05/16/2018   No results found for: "DDIMER" Does the Patient currently have chest pain? No    R Recommendations: See Admitting Provider Note  Report given to:   Additional Notes: Pt is alert and oriented, ambulatory, extremely pleasant. Not symptomatic with his low hemoglobin

## 2023-11-17 NOTE — H&P (View-Only) (Signed)
 Referring Provider: Dr. Austin Miles         Primary Care Physician:  Ralene Ok, MD Primary Gastroenterologist: Dr. Elnoria Howard            Reason for Consultation:   Hematochezia                ASSESSMENT /  PLAN    Hematochezia, suspected post polypectomy bleed Anemia Patient started developing rectal bleeding after his colonoscopy yesterday when he had 2 polyps removed.  I suspect that he is most likely having post polypectomy bleeding from the larger polyp that was removed, which required placement of 2 clips yesterday already.  His hemoglobin has dropped from 10.2 in 04/2023 to 6.7 upon arrival.  Will plan for colonoscopy tomorrow for further evaluation and potential treatment of his rectal bleeding -Continue to trend blood counts.  Transfuse as necessary for hemoglobin less than 7 -Continue to hold Eliquis for now.  Last dose was yesterday afternoon. -Plan for colonoscopy tomorrow for further evaluation.  Placed bowel prep for this evening   HPI:     Dean Livesay Sr. is a 73 y.o. male with history of arthritis, asthma, HFrEF with EF of 35-40% and grade 1 diastolic dysfunction, CAD s/p PCI, cardiac arrest s/p ICD placement and 2018, OSA on CPAP, LBBB, atrial fibrillation on Eliquis, type 2 diabetes presents with hematochezia.  He recently just had a colonoscopy yesterday where he had 2 colon polyps removed and was noted to have diverticulosis.  The larger colon polyp was removed with hot snare and required 2 clips to be placed to prevent post polypectomy bleeding.  Starting around 1 PM yesterday he started developing rectal bleeding.  He thought that this rectal bleeding may resolve on its own.  He contacted his PCP who instructed him to go ahead and take his Eliquis therapy.  His last dose of Eliquis was yesterday at around 1 PM.  Unfortunately his rectal bleeding has continued.  He just passed another episode of rectal bleeding in the last hour.  He has been consuming solid foods following  his procedure and had breakfast this morning as well.  Denies any abdominal pain.  He does feel bloated.  Denies any lightheadedness.  Might feel slightly short of breath.  Denies any chest pain.  Denies any fevers.  Denies nausea, vomiting.  He has had ongoing diarrhea in his stools.  Colonoscopy 11/16/23: - One 2 mm polyp in the ascending colon, removed with a cold snare. Resected and retrieved. - One 10 mm polyp in the transverse colon, removed with a hot snare. Resected and retrieved. Clip manufacturer: [employer]. Clips ( MR safe) were placed. - Diverticulosis in the sigmoid colon.    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 09/2016 s/p MDT Visia AF MRI single lead ICD (ser # [id number]).   Arthritis    "maybe in his spine" (09/05/2016)   Asthma    pt. denies   Cardiac arrest (HCC)    a. 08/2016 VT/VF Arrest-->09/2016 s/p MDT Visia AF MRI single lead ICD (ser # [id number]).   Chronic HFrEF (heart failure with reduced ejection fraction) (HCC)    a. 08/2016 Echo: EF 35-40%; b. 04/2018 Echo: EF 30-35%; c. 04/2020 Echo: EF 30-35%; d. 11/2021 Echo: EF 40-45%, GrI DD, nl RV fxn, sev dil LA, mild-mod MR, mild-mod AI, Ao root 38mm.   Coronary artery disease    a. 08/2016 VT Arrest/Cath: LM nl, LAD nl,  LCX nl, RGCA 90p, 100d w/ L->R collats, EF 35-45%.   Gout    Hyperlipidemia LDL goal <70    Hypertension    Ischemic cardiomyopathy    a. 08/2016 Echo: EF 35-40%; b. 04/2018 Echo: EF 30-35%; c. 04/2020 Echo: EF 30-35%; d. 11/2021 Echo: EF 40-45%.   LBBB (left bundle branch block)    OSA on CPAP    does not wear CPAP   PAF (paroxysmal atrial fibrillation) (HCC)    a. CHA2DS2VASc = 5-->Eliquis.   Type II diabetes mellitus (HCC)     Past Surgical History:  Procedure Laterality Date   CARDIAC CATHETERIZATION N/A 09/07/2016   Procedure: Left Heart Cath and Coronary Angiography;  Surgeon: Peter M Swaziland, MD;  Location: [facility]  INVASIVE CV LAB;  Service: Cardiovascular;  Laterality: N/A;   COLONOSCOPY WITH PROPOFOL N/A 08/30/2018   Procedure: COLONOSCOPY WITH PROPOFOL;  Surgeon: Jeani Hawking, MD;  Location: [facility] ENDOSCOPY;  Service: Endoscopy;  Laterality: N/A;   EP IMPLANTABLE DEVICE N/A 10/10/2016   Procedure: ICD Implant;  Surgeon: Will Jorja Loa, MD;  Location: [facility] INVASIVE CV LAB;  Service: Cardiovascular;  Laterality: N/A;   LUMBAR LAMINECTOMY/DECOMPRESSION MICRODISCECTOMY N/A 01/18/2018   Procedure: L2-3, L3-4, L-4-5 CENTRAL DECOMPRESSION;  Surgeon: Eldred Manges, MD;  Location: [facility] OR;  Service: Orthopedics;  Laterality: N/A;   LUMBAR LAMINECTOMY/DECOMPRESSION MICRODISCECTOMY N/A 08/12/2020   Procedure: THORACIC ELEVEN LAMINECTOMY;  Surgeon: Julio Sicks, MD;  Location: [facility] OR;  Service: Neurosurgery;  Laterality: N/A;   POLYPECTOMY  08/30/2018   Procedure: POLYPECTOMY;  Surgeon: Jeani Hawking, MD;  Location: [facility] ENDOSCOPY;  Service: Endoscopy;;    Prior to Admission medications   Medication Sig Start Date End Date Taking? Authorizing Provider  allopurinol (ZYLOPRIM) 300 MG tablet Take 300 mg by mouth daily. 06/13/23  Yes [provider]  amiodarone (PACERONE) 200 MG tablet Take 1 tablet (200 mg total) by mouth daily. 09/06/22  Yes Sheilah Pigeon, PA-C  atorvastatin (LIPITOR) 40 MG tablet Take 1 tablet (40 mg total) by mouth daily at 6 PM. Patient taking differently: Take 40 mg by mouth daily. 10/27/16  Yes Camnitz, Will Daphine Deutscher, MD  carvedilol (COREG) 12.5 MG tablet TAKE 1 TABLET (12.5MG  TOTAL) BY MOUTH TWICE A DAY WITH MEALS 08/28/23  Yes Sheilah Pigeon, PA-C  cholecalciferol (VITAMIN D3) 25 MCG (1000 UNIT) tablet Take 1,000 Units by mouth daily. 06/13/23  Yes [provider]  ELIQUIS 5 MG TABS tablet TAKE 1 TABLET BY MOUTH TWICE A DAY 04/20/23  Yes Camnitz, Will Daphine Deutscher, MD  ezetimibe (ZETIA) 10 MG tablet Take 1 tablet (10 mg total) by mouth daily. 05/07/23  Yes Sheilah Pigeon, PA-C  ferrous  sulfate 325 (65 FE) MG tablet Take 325 mg by mouth daily. 11/08/23  Yes [provider]  furosemide (LASIX) 20 MG tablet Take 20 mg by mouth daily. Takes along with 40 mg to total 60 mg daily   Yes [provider]  furosemide (LASIX) 40 MG tablet Take 1 tablet (40 mg total) by mouth daily. Patient taking differently: Take 40 mg by mouth daily. Takes along with 20 mg to total 60 mg 09/08/22  Yes Sheilah Pigeon, PA-C  spironolactone (ALDACTONE) 25 MG tablet Take 0.5 tablets (12.5 mg total) by mouth daily. Patient taking differently: Take 25 mg by mouth daily. 11/09/23  Yes Ronney Asters, NP  valsartan (DIOVAN) 160 MG tablet Take 1 tablet (160 mg total) by mouth daily. 11/09/23  Yes Hilty, Lisette Abu, MD  magnesium oxide (MAG-OX) 400 (240 Mg) MG tablet TAKE 1 TABLET BY MOUTH EVERY DAY Patient not taking: Reported on 11/17/2023 09/06/22   Chrystie Nose, MD    Current Facility-Administered Medications  Medication Dose Route Frequency Provider Last Rate Last Admin   0.9 %  sodium chloride infusion (Manually program via Guardrails IV Fluids)   Intravenous Once Linwood Dibbles, MD       0.9 %  sodium chloride infusion   Intravenous Continuous Imogene Burn, MD       acetaminophen (TYLENOL) tablet 650 mg  650 mg Oral Q6H PRN Briscoe Burns, MD       Or   acetaminophen (TYLENOL) suppository 650 mg  650 mg Rectal Q6H PRN Briscoe Burns, MD       [START ON 11/18/2023] amiodarone (PACERONE) tablet 200 mg  200 mg Oral Daily Briscoe Burns, MD       [START ON 11/18/2023] atorvastatin (LIPITOR) tablet 40 mg  40 mg Oral Daily Briscoe Burns, MD       [START ON 11/18/2023] ezetimibe (ZETIA) tablet 10 mg  10 mg Oral Daily Briscoe Burns, MD       [START ON 11/18/2023] furosemide (LASIX) tablet 60 mg  60 mg Oral Daily Briscoe Burns, MD       Na Sulfate-K Sulfate-Mg Sulfate concentrate (SUPREP) kit 177 mL  0.5 kit Oral Once Imogene Burn, MD       Followed by   Melene Muller ON 11/18/2023] Na  Sulfate-K Sulfate-Mg Sulfate concentrate (SUPREP) kit 177 mL  0.5 kit Oral Once Imogene Burn, MD       potassium chloride SA (KLOR-CON M) CR tablet 40 mEq  40 mEq Oral Once Linwood Dibbles, MD       Current Outpatient Medications  Medication Sig Dispense Refill   allopurinol (ZYLOPRIM) 300 MG tablet Take 300 mg by mouth daily.     amiodarone (PACERONE) 200 MG tablet Take 1 tablet (200 mg total) by mouth daily. 90 tablet 1   atorvastatin (LIPITOR) 40 MG tablet Take 1 tablet (40 mg total) by mouth daily at 6 PM. (Patient taking differently: Take 40 mg by mouth daily.) 30 tablet 5   carvedilol (COREG) 12.5 MG tablet TAKE 1 TABLET (12.5MG  TOTAL) BY MOUTH TWICE A DAY WITH MEALS 180 tablet 3   cholecalciferol (VITAMIN D3) 25 MCG (1000 UNIT) tablet Take 1,000 Units by mouth daily.     ELIQUIS 5 MG TABS tablet TAKE 1 TABLET BY MOUTH TWICE A DAY 180 tablet 2   ezetimibe (ZETIA) 10 MG tablet Take 1 tablet (10 mg total) by mouth daily. 90 tablet 2   ferrous sulfate 325 (65 FE) MG tablet Take 325 mg by mouth daily.     furosemide (LASIX) 20 MG tablet Take 20 mg by mouth daily. Takes along with 40 mg to total 60 mg daily     furosemide (LASIX) 40 MG tablet Take 1 tablet (40 mg total) by mouth daily. (Patient taking differently: Take 40 mg by mouth daily. Takes along with 20 mg to total 60 mg)     spironolactone (ALDACTONE) 25 MG tablet Take 0.5 tablets (12.5 mg total) by mouth daily. (Patient taking differently: Take 25 mg by mouth daily.) 45 tablet 3   valsartan (DIOVAN) 160 MG tablet Take 1 tablet (160 mg total) by mouth daily. 90 tablet 2   magnesium oxide (MAG-OX) 400 (240 Mg) MG tablet TAKE 1 TABLET BY MOUTH EVERY DAY (Patient  not taking: Reported on 11/17/2023) 90 tablet 0    Allergies as of 11/17/2023 - Review Complete 11/17/2023  Allergen Reaction Noted   Contrast media [iodinated contrast media] Shortness Of Breath and Nausea And Vomiting 10/10/2016   Lisinopril Cough 09/19/2016    Family History   Problem Relation Age of Onset   Heart attack Father        died in his 42's   Hypertension Sister    Cancer Brother        uncertain, "leg"   Hypertension Sister     Social History   Tobacco Use   Smoking status: Never   Smokeless tobacco: Never  Vaping Use   Vaping status: Never Used  Substance Use Topics   Alcohol use: No   Drug use: No    Review of Systems: All systems reviewed and negative except where noted in HPI.  Physical Exam: Vital signs in last 24 hours: Temp:  [97.5 F (36.4 C)-98.3 F (36.8 C)] 98.3 F (36.8 C) (02/22 1713) Pulse Rate:  [56-71] 56 (02/22 1730) Resp:  [18-19] 19 (02/22 1730) BP: (110-143)/(39-51) 126/48 (02/22 1730) SpO2:  [93 %-100 %] 100 % (02/22 1730)   General:   Awake, alert, NAD Psych:  Pleasant, cooperative. Normal mood and affect. Eyes:  Pupils equal, sclera clear, no icterus.    Neck:  Supple; no masses Lungs:  Clear throughout to auscultation Heart:  Regular rate Abdomen:  Soft, non-distended, nontender Msk:  Symmetrical without gross deformities. . Neurologic:  Alert and  oriented x4;  grossly normal neurologically. Skin:  Intact without significant lesions or rashes.   Intake/Output from previous day: No intake/output data recorded. Intake/Output this shift: No intake/output data recorded.  Lab Results: Recent Labs    11/17/23 1343  WBC 10.0  HGB 6.7*  HCT 22.8*  PLT 163   BMET Recent Labs    11/17/23 1343  NA 135  K 3.1*  CL 105  CO2 20*  GLUCOSE 88  BUN 30*  CREATININE 1.70*  CALCIUM 8.8*   LFT Recent Labs    11/17/23 1343  PROT 5.8*  ALBUMIN 3.0*  AST 18  ALT 14  ALKPHOS 54  BILITOT 0.4   PT/INR Recent Labs    11/17/23 1720  LABPROT 20.7*  INR 1.8*   Hepatitis Panel No results for input(s): "HEPBSAG", "HCVAB", "HEPAIGM", "HEPBIGM" in the last 72 hours.   .    Latest Ref Rng & Units 11/17/2023    1:43 PM 05/07/2023   10:36 AM 08/26/2022    1:32 AM  CBC  WBC 4.0 - 10.5 K/uL  10.0  8.5  8.1   Hemoglobin 13.0 - 17.0 g/dL 6.7  16.1  9.2   Hematocrit 39.0 - 52.0 % 22.8  33.6  28.5   Platelets 150 - 400 K/uL 163  152  140     .    Latest Ref Rng & Units 11/17/2023    1:43 PM 05/07/2023   10:36 AM 11/07/2022    2:34 PM  CMP  Glucose 70 - 99 mg/dL 88  096  84   BUN 8 - 23 mg/dL 30  54  42   Creatinine 0.61 - 1.24 mg/dL 0.45  4.09  8.11   Sodium 135 - 145 mmol/L 135  137  142   Potassium 3.5 - 5.1 mmol/L 3.1  5.4  4.8   Chloride 98 - 111 mmol/L 105  107  105   CO2 22 - 32 mmol/L 20  19  21   Calcium 8.9 - 10.3 mg/dL 8.8  93.2  35.5   Total Protein 6.5 - 8.1 g/dL 5.8  7.2    Total Bilirubin 0.0 - 1.2 mg/dL 0.4  0.5    Alkaline Phos 38 - 126 U/L 54  96    AST 15 - 41 U/L 18  16    ALT 0 - 44 U/L 14  9     Studies/Results: No results found.  Principal Problem:   Acute GI bleeding Active Problems:   Type 2 diabetes mellitus with obesity (HCC)   ICD (implantable cardioverter-defibrillator) in place   CKD (chronic kidney disease), stage III (HCC)   Chronic combined systolic and diastolic CHF (congestive heart failure) (HCC)   Atrial fibrillation, chronic (HCC)    Eulah Pont, M.D. @  11/17/2023, 6:22 PM

## 2023-11-17 NOTE — Telephone Encounter (Signed)
 Patient of Dr. Haywood Pao called weekend on-call service, patient underwent a colonoscopy Friday, 11/16/2023 and still having rectal bleeding.  He had 2 polyps removed from the colon 1 measuring 2 mm and the second polyp was 10 mm and a clip was placed.  He restarted his Eliquis yesterday at 1 PM.  Contacted his PCP who advised the patient to hold Eliquis for 24 hours.  He stated passing 4-5 bright red bloody bowel movements overnight and this morning.  He stated its enough blood to turn the toilet water red.  No abdominal pain.  I advised the patient to go to [facility] or [facility] ED for further evaluation and to check his hemoglobin/hematocrit level.  He stated the bleeding was less with the last episode and he wishes to follow his PCPs instructions by holding Eliquis and to continue to monitor.  I advised the patient if he has any further blood from the rectum to go to the emergency room.  He verbalized understanding's instructions.  Patient was instructed to contact Dr. Haywood Pao office on Monday for follow-up.

## 2023-11-17 NOTE — ED Triage Notes (Signed)
 Pt reports bright red bleeding when going to the bathroom since yesterday. Pt reports having a colonoscopy done yesterday.

## 2023-11-17 NOTE — Hospital Course (Signed)
 [name]

## 2023-11-18 ENCOUNTER — Observation Stay (HOSPITAL_COMMUNITY): Payer: No Typology Code available for payment source | Admitting: Certified Registered"

## 2023-11-18 ENCOUNTER — Encounter (HOSPITAL_COMMUNITY): Payer: Self-pay | Admitting: Student

## 2023-11-18 ENCOUNTER — Encounter (HOSPITAL_COMMUNITY)
Admission: EM | Disposition: A | Discharge status: Home / Self Care | Payer: Self-pay | Source: Home / Self Care | Attending: Emergency Medicine

## 2023-11-18 DIAGNOSIS — K635 Polyp of colon: Secondary | ICD-10-CM

## 2023-11-18 DIAGNOSIS — D123 Benign neoplasm of transverse colon: Secondary | ICD-10-CM

## 2023-11-18 DIAGNOSIS — K640 First degree hemorrhoids: Secondary | ICD-10-CM | POA: Diagnosis not present

## 2023-11-18 DIAGNOSIS — D62 Acute posthemorrhagic anemia: Secondary | ICD-10-CM

## 2023-11-18 DIAGNOSIS — K5730 Diverticulosis of large intestine without perforation or abscess without bleeding: Secondary | ICD-10-CM | POA: Diagnosis not present

## 2023-11-18 DIAGNOSIS — Z1211 Encounter for screening for malignant neoplasm of colon: Secondary | ICD-10-CM

## 2023-11-18 DIAGNOSIS — K922 Gastrointestinal hemorrhage, unspecified: Secondary | ICD-10-CM | POA: Diagnosis not present

## 2023-11-18 DIAGNOSIS — I2510 Atherosclerotic heart disease of native coronary artery without angina pectoris: Secondary | ICD-10-CM

## 2023-11-18 DIAGNOSIS — D122 Benign neoplasm of ascending colon: Secondary | ICD-10-CM

## 2023-11-18 DIAGNOSIS — Z860100 Personal history of colon polyps, unspecified: Secondary | ICD-10-CM

## 2023-11-18 HISTORY — PX: HEMOSTASIS CLIP PLACEMENT: SHX6857

## 2023-11-18 HISTORY — PX: COLONOSCOPY WITH PROPOFOL: SHX5780

## 2023-11-18 HISTORY — PX: HEMOSTASIS CONTROL: SHX6838

## 2023-11-18 LAB — BASIC METABOLIC PANEL
Anion gap: 9 (ref 5–15)
BUN: 35 mg/dL — ABNORMAL HIGH (ref 8–23)
CO2: 23 mmol/L (ref 22–32)
Calcium: 8.9 mg/dL (ref 8.9–10.3)
Chloride: 110 mmol/L (ref 98–111)
Creatinine, Ser: 1.81 mg/dL — ABNORMAL HIGH (ref 0.61–1.24)
GFR, Estimated: 39 mL/min — ABNORMAL LOW (ref >60)
Glucose, Bld: 94 mg/dL (ref 70–99)
Potassium: 4 mmol/L (ref 3.5–5.1)
Sodium: 142 mmol/L (ref 135–145)

## 2023-11-18 LAB — GLUCOSE, CAPILLARY
Glucose-Capillary: 74 mg/dL (ref 70–99)
Glucose-Capillary: 78 mg/dL (ref 70–99)

## 2023-11-18 LAB — MAGNESIUM: Magnesium: 1.8 mg/dL (ref 1.7–2.4)

## 2023-11-18 LAB — CBC
HCT: 25.5 % — ABNORMAL LOW (ref 39.0–52.0)
Hemoglobin: 7.9 g/dL — ABNORMAL LOW (ref 13.0–17.0)
MCH: 27.9 pg (ref 26.0–34.0)
MCHC: 31 g/dL (ref 30.0–36.0)
MCV: 90.1 fL (ref 80.0–100.0)
Platelets: 130 10*3/uL — ABNORMAL LOW (ref 150–400)
RBC: 2.83 MIL/uL — ABNORMAL LOW (ref 4.22–5.81)
RDW: 16.5 % — ABNORMAL HIGH (ref 11.5–15.5)
WBC: 10.9 10*3/uL — ABNORMAL HIGH (ref 4.0–10.5)
nRBC: 0 % (ref 0.0–0.2)

## 2023-11-18 LAB — HEMOGLOBIN AND HEMATOCRIT, BLOOD
HCT: 25.1 % — ABNORMAL LOW (ref 39.0–52.0)
Hemoglobin: 7.8 g/dL — ABNORMAL LOW (ref 13.0–17.0)

## 2023-11-18 SURGERY — COLONOSCOPY WITH PROPOFOL
Anesthesia: Monitor Anesthesia Care

## 2023-11-18 MED ORDER — ACETAMINOPHEN 10 MG/ML IV SOLN: 1000.0000 mg | Freq: Once | INTRAVENOUS | Status: AC | PRN

## 2023-11-18 MED ORDER — DROPERIDOL 2.5 MG/ML IJ SOLN: 0.6250 mg | Freq: Once | INTRAMUSCULAR | Status: DC | PRN

## 2023-11-18 MED ORDER — OXYCODONE HCL 5 MG/5ML PO SOLN: 5.0000 mg | Freq: Once | ORAL | Status: DC | PRN

## 2023-11-18 MED ORDER — FENTANYL CITRATE (PF) 100 MCG/2ML IJ SOLN: 25.0000 ug | INTRAMUSCULAR | Status: DC | PRN

## 2023-11-18 MED ORDER — PROPOFOL 500 MG/50ML IV EMUL
INTRAVENOUS | Status: DC | PRN
  Administered 2023-11-18: 100 ug/kg/min via INTRAVENOUS

## 2023-11-18 MED ORDER — PROPOFOL 10 MG/ML IV BOLUS
INTRAVENOUS | Status: DC | PRN
  Administered 2023-11-18: 40 mg via INTRAVENOUS

## 2023-11-18 MED ORDER — PHENYLEPHRINE 80 MCG/ML (10ML) SYRINGE FOR IV PUSH (FOR BLOOD PRESSURE SUPPORT)
PREFILLED_SYRINGE | INTRAVENOUS | Status: DC | PRN
  Administered 2023-11-18 (×4): 160 ug via INTRAVENOUS

## 2023-11-18 MED ORDER — EPHEDRINE SULFATE-NACL 50-0.9 MG/10ML-% IV SOSY
PREFILLED_SYRINGE | INTRAVENOUS | Status: DC | PRN
  Administered 2023-11-18: 10 mg via INTRAVENOUS

## 2023-11-18 MED ORDER — SODIUM CHLORIDE 0.9 % IV SOLN: INTRAVENOUS | Status: DC

## 2023-11-18 MED ORDER — NA SULFATE-K SULFATE-MG SULF 17.5-3.13-1.6 GM/177ML PO SOLN
0.5000 | Freq: Once | ORAL | Status: AC
Start: 2023-11-18 — End: 2023-11-18
  Administered 2023-11-18: 177 mL via ORAL
  Filled 2023-11-18: qty 1

## 2023-11-18 MED ORDER — OXYCODONE HCL 5 MG PO TABS: 5.0000 mg | ORAL_TABLET | Freq: Once | ORAL | Status: DC | PRN

## 2023-11-18 MED ORDER — SODIUM CHLORIDE 0.9 % IV SOLN: INTRAVENOUS | Status: DC | PRN

## 2023-11-18 SURGICAL SUPPLY — 20 items
ELECT REM PT RETURN 9FT ADLT (ELECTROSURGICAL) IMPLANT
ELECTRODE REM PT RTRN 9FT ADLT (ELECTROSURGICAL) IMPLANT
FLOOR PAD 36X40 (MISCELLANEOUS) ×2 IMPLANT
FORCEPS BIOP RAD 4 LRG CAP 4 (CUTTING FORCEPS) IMPLANT
FORCEPS BIOP RJ4 240 W/NDL (CUTTING FORCEPS) IMPLANT
FORCEPS BXJMBJMB 240X2.8X (CUTTING FORCEPS) IMPLANT
INJECTOR/SNARE I SNARE (MISCELLANEOUS) IMPLANT
LUBRICANT JELLY 4.5OZ STERILE (MISCELLANEOUS) IMPLANT
MANIFOLD NEPTUNE II (INSTRUMENTS) IMPLANT
NDL SCLEROTHERAPY 25GX240 (NEEDLE) IMPLANT
NEEDLE SCLEROTHERAPY 25GX240 (NEEDLE) IMPLANT
PAD FLOOR 36X40 (MISCELLANEOUS) ×3 IMPLANT
PROBE APC STR FIRE (PROBE) IMPLANT
PROBE INJECTION GOLD 7FR (MISCELLANEOUS) IMPLANT
SNARE ROTATE MED OVAL 20MM (MISCELLANEOUS) IMPLANT
SYR 50ML LL SCALE MARK (SYRINGE) IMPLANT
TRAP SPECIMEN MUCOUS 40CC (MISCELLANEOUS) IMPLANT
TUBING ENDO SMARTCAP PENTAX (MISCELLANEOUS) IMPLANT
TUBING IRRIGATION ENDOGATOR (MISCELLANEOUS) ×3 IMPLANT
WATER STERILE IRR 1000ML POUR (IV SOLUTION) IMPLANT

## 2023-11-18 NOTE — Anesthesia Preprocedure Evaluation (Signed)
 Anesthesia Evaluation  Patient identified by MRN, date of birth, ID band Patient awake    Reviewed: Allergy & Precautions, NPO status , Patient's Chart, lab work & pertinent test results  Airway Mallampati: I  TM Distance: >3 FB Neck ROM: Full    Dental  (+) Dental Advisory Given, Poor Dentition, Missing   Pulmonary asthma , sleep apnea and Continuous Positive Airway Pressure Ventilation    breath sounds clear to auscultation       Cardiovascular hypertension, + CAD and +CHF  + dysrhythmias Atrial Fibrillation + Cardiac Defibrillator  Rhythm:Regular Rate:Normal  Echo:  1. Akinesis of the inferolateral wall with overall moderate LV  dysfunction.   2. Left ventricular ejection fraction, by estimation, is 35 to 40%. The  left ventricle has moderately decreased function. The left ventricle  demonstrates regional wall motion abnormalities (see scoring  diagram/findings for description). The left  ventricular internal cavity size was moderately dilated. Left ventricular  diastolic parameters are consistent with Grade I diastolic dysfunction  (impaired relaxation). Elevated left atrial pressure.   3. Right ventricular systolic function is normal. The right ventricular  size is normal. There is mildly elevated pulmonary artery systolic  pressure.   4. Left atrial size was moderately dilated.   5. The mitral valve is normal in structure. Moderate mitral valve  regurgitation. No evidence of mitral stenosis.   6. The aortic valve has an indeterminant number of cusps. Aortic valve  regurgitation is moderate. No aortic stenosis is present.   7. Aortic dilatation noted. There is borderline dilatation of the aortic  root, measuring 39 mm.   8. The inferior vena cava is normal in size with greater than 50%  respiratory variability, suggesting right atrial pressure of 3 mmHg.     Neuro/Psych negative neurological ROS  negative psych ROS    GI/Hepatic Neg liver ROS,,,  Endo/Other  diabetes    Renal/GU Renal disease     Musculoskeletal  (+) Arthritis ,    Abdominal   Peds  Hematology  (+) Blood dyscrasia, anemia   Anesthesia Other Findings rectal bleeding after his colonoscopy yesterday when he had 2 polyps removed. Restarted eliquis  Reproductive/Obstetrics                              Anesthesia Physical Anesthesia Plan  ASA: 4  Anesthesia Plan: MAC   Post-op Pain Management: Minimal or no pain anticipated   Induction: Intravenous  PONV Risk Score and Plan: 0 and Propofol infusion  Airway Management Planned: Natural Airway and Nasal Cannula  Additional Equipment: None  Intra-op Plan:   Post-operative Plan:   Informed Consent: I have reviewed the patients History and Physical, chart, labs and discussed the procedure including the risks, benefits and alternatives for the proposed anesthesia with the patient or authorized representative who has indicated his/her understanding and acceptance.       Plan Discussed with: CRNA  Anesthesia Plan Comments: Dougles Kimmey. is a 73 y.o. male with history of arthritis, asthma, HFrEF with EF of 35-40% and grade 1 diastolic dysfunction, CAD s/p PCI, cardiac arrest s/p ICD placement and 2018, OSA on CPAP, LBBB, atrial fibrillation on Eliquis, type 2 diabetes presents with hematochezia.)        Anesthesia Quick Evaluation

## 2023-11-18 NOTE — Op Note (Signed)
 [facility] Patient Name: Dean [name] Procedure Date: 11/18/2023 MRN: [id number] Attending MD: Lynann Bologna , MD, [id number] Date of Birth: August 05, 1951 CSN: [id number] Age: 73 Admit Type: Inpatient Procedure:                Colonoscopy Indications:              Post polypectomy bleed after resuming Eliquis. Hb                            10 to 6.7 s/p 1U 7.8. (S/P polypectomy 2/21, last                            dose of Eliquis 2/21) Providers:                Lynann Bologna, MD, Eliberto Ivory, RN, Marja Kays,                            Technician Referring MD:             Dr Jeani Hawking. Medicines:                Monitored Anesthesia Care Complications:            No immediate complications. Estimated Blood Loss:     Estimated blood loss: none. Procedure:                Pre-Anesthesia Assessment:                           - Prior to the procedure, a History and Physical                            was performed, and patient medications and                            allergies were reviewed. The patient's tolerance of                            previous anesthesia was also reviewed. The risks                            and benefits of the procedure and the sedation                            options and risks were discussed with the patient.                            All questions were answered, and informed consent                            was obtained. Prior Anticoagulants: The patient has                            taken Eliquis (apixaban), last dose was 2 days  prior to procedure. ASA Grade Assessment: III - A                            patient with severe systemic disease. After                            reviewing the risks and benefits, the patient was                            deemed in satisfactory condition to undergo the                            procedure.                           After obtaining informed consent, the  colonoscope                            was passed under direct vision. Throughout the                            procedure, the patient's blood pressure, pulse, and                            oxygen saturations were monitored continuously. The                            [id number]-HQ190L ([id number]) Olympus colonoscope was                            introduced through the anus and advanced to the 1                            cm into the ileum. The colonoscopy was performed                            without difficulty. The patient tolerated the                            procedure well. The quality of the bowel                            preparation was good. The ileocecal valve,                            appendiceal orifice, and rectum were photographed. Scope In: 7:42:37 AM Scope Out: 8:04:28 AM Scope Withdrawal Time: 0 hours 15 minutes 48 seconds  Total Procedure Duration: 0 hours 21 minutes 51 seconds  Findings:      A clot was noted at post polypectomy site in the mid transverse colon       with 2 clips. The clot was gently removed with resultant active bleeding       from the edge of polypectomy site. On first attempt, 360 clip misfired.       Thereafter, a  single Dura-clip was successfully placed at the edge of       polypectomy site with resultant good hemostasis. To ensure good       hemostasis, 3 cc of pura-stat was applied to the site. There was no       bleeding at the end of the procedure.      Another small post polypectomy site was noted in mid ascending colon       with a small clot. One hemostatic clip was successfully placed.      Multiple medium-mouthed diverticula were found in the sigmoid colon and       rare ascending colon.      Non-bleeding internal hemorrhoids were found during retroflexion. The       hemorrhoids were small and Grade I (internal hemorrhoids that do not       prolapse).      The terminal ileum appeared normal. No blood in TI      The exam was otherwise  without abnormality on direct and retroflexion       views. Some fresh blood and few clots were noted in the colon. This was       successfully aspirated Impression:               - Post polypectomy site bleed s/p additional clip                            followed by Purastat.                           - Moderate sigmoid diverticulosis.                           - Non-bleeding internal hemorrhoids.                           - The examined portion of the ileum was normal.                           - The examination was otherwise normal on direct                            and retroflexion views.                           - No specimens collected. Moderate Sedation:      Not Applicable - Patient had care per Anesthesia. Recommendation:           - Patient has a contact number available for                            emergencies. The signs and symptoms of potential                            delayed complications were discussed with the                            patient. Return to normal activities tomorrow.  Written discharge instructions were provided to the                            patient.                           - Full liquid diet. Advance as tolerated                           - Continue present medications.                           - Trend CBC. Keep Hb>7                           - Resume Eliquis in 72 hours.                           - Repeat colonoscopy for surveillance based on                            pathology results (from previous colonoscopy).                           - Dr Nicholes Mango taking over the service tomorrow                           - The findings and recommendations were discussed                            with the patient's family. Procedure Code(s):        --- Professional ---                           [phone], Colonoscopy, flexible; diagnostic, including                            collection of specimen(s) by brushing or  washing,                            when performed (separate procedure) Diagnosis Code(s):        --- Professional ---                           D12.3, Benign neoplasm of transverse colon (hepatic                            flexure or splenic flexure)                           D12.2, Benign neoplasm of ascending colon                           K64.0, First degree hemorrhoids                           K62.5, Hemorrhage of  anus and rectum                           K57.30, Diverticulosis of large intestine without                            perforation or abscess without bleeding CPT copyright 2022 [employer]. All rights reserved. The codes documented in this report are preliminary and upon coder review may  be revised to meet current compliance requirements. Lynann Bologna, MD 11/18/2023 8:24:24 AM This report has been signed electronically. Number of Addenda: 0

## 2023-11-18 NOTE — Anesthesia Postprocedure Evaluation (Signed)
 Anesthesia Post Note  Patient: Dean Menz Sr.  Procedure(s) Performed: COLONOSCOPY WITH PROPOFOL HEMOSTASIS CLIP PLACEMENT HEMOSTASIS CONTROL     Patient location during evaluation: PACU Anesthesia Type: MAC Level of consciousness: awake and alert Pain management: pain level controlled Vital Signs Assessment: post-procedure vital signs reviewed and stable Respiratory status: spontaneous breathing, nonlabored ventilation, respiratory function stable and patient connected to nasal cannula oxygen Cardiovascular status: stable and blood pressure returned to baseline Postop Assessment: no apparent nausea or vomiting Anesthetic complications: no   No notable events documented.  Last Vitals:  Vitals:   11/18/23 0842 11/18/23 0908  BP: (!) 126/46 (!) 126/42  Pulse: 77 73  Resp: 18 16  Temp: 36.6 C (!) 36.4 C  SpO2: 98% 99%    Last Pain:  Vitals:   11/18/23 0908  TempSrc:   PainSc: 0-No pain                 [place]

## 2023-11-18 NOTE — Care Management Obs Status (Signed)
 MEDICARE OBSERVATION STATUS NOTIFICATION   Patient Details  Name: Dean Vivero Sr. MRN: [id number] Date of Birth: 06-27-1951   Medicare Observation Status Notification Given:  Yes    Adrian Prows, RN 11/18/2023, 1:22 PM

## 2023-11-18 NOTE — TOC Initial Note (Signed)
 Transition of Care (TOC) - Initial/Assessment Note    Patient Details  Name: Dean Hirsch Sr. MRN: [id number] Date of Birth: 11-24-1950  [facility] of [facility]) CM/SW Contact:    Adrian Prows, RN Phone Number: 11/18/2023, 1:32 PM  Clinical Narrative:                 Sherron Monday w/ pt in room; pt says he lives at home w/ his dtr; he plans to return at d/c; he identified his dtr Dean [name] as POC [phone]); he confirmed PCP/insurance; pt says he has difficulty paying utilities; pt says he has cane, walker, wheelchair, BSC, shower chair; he does not have HH services or home oxygen; pt agrees to receive resources for social services and financial assistance; resources placed in d/c instructions; pt also given copies or resources; he will make his own appt at agency of choice; also this RN, CM explained MOON to pt; he verbalized understanding and signed document; copy of MOON given to pt; no TOC needs.   Expected Discharge Plan: Home/Self Care Barriers to Discharge: Continued Medical Work up   Patient Goals and CMS Choice Patient states their goals for this hospitalization and ongoing recovery are:: home CMS Medicare.gov Compare Post Acute Care list provided to:: Patient        Expected Discharge Plan and Services   Discharge Planning Services: CM Consult   Living arrangements for the past 2 months: Single Family Home                                      Prior Living Arrangements/Services Living arrangements for the past 2 months: Single Family Home Lives with:: Adult Children Patient language and need for interpreter reviewed:: Yes Do you feel safe going back to the place where you live?: Yes      Need for Family Participation in Patient Care: Yes (Comment) Care giver support system in place?: Yes (comment) Current home services: DME (cane, walker, wheelchair, BSC, shower chair) Criminal Activity/Legal Involvement Pertinent to Current  Situation/Hospitalization: No - Comment as needed  Activities of Daily Living   ADL Screening (condition at time of admission) Independently performs ADLs?: Yes (appropriate for developmental age) Is the patient deaf or have difficulty hearing?: No Does the patient have difficulty seeing, even when wearing glasses/contacts?: No Does the patient have difficulty concentrating, remembering, or making decisions?: No  Permission Sought/Granted Permission sought to share information with : Case Manager Permission granted to share information with : Yes, Verbal Permission Granted  Share Information with NAME: Case Manger     Permission granted to share info w Relationship: Isaul Landi (dtr) [phone]     Emotional Assessment Appearance:: Appears stated age Attitude/Demeanor/Rapport: Gracious Affect (typically observed): Accepting Orientation: : Oriented to Self, Oriented to Place, Oriented to  Time, Oriented to Situation Alcohol / Substance Use: Not Applicable Psych Involvement: No (comment)  Admission diagnosis:  Acute GI bleeding [K92.2] Gastrointestinal hemorrhage, unspecified gastrointestinal hemorrhage type [K92.2] Patient Active Problem List   Diagnosis Date Noted   Acute GI bleeding 11/17/2023   Ventricular tachycardia (HCC) 08/24/2022   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   Atrial fibrillation, chronic (HCC) 08/12/2020   Thoracic spine fracture (HCC) 06/29/2020   Hyperkalemia 10/23/2019   Diarrhea 10/23/2019   CHF (congestive heart failure) (HCC) 06/04/2019   Acute systolic CHF (congestive heart failure) (HCC) 06/04/2019   Sepsis (HCC) 05/06/2019  Acute asthma exacerbation 05/06/2019   Acute respiratory distress 05/06/2019   Acute respiratory failure with hypoxia (HCC) 04/06/2019   HCAP (healthcare-associated pneumonia) 04/06/2019   Acute kidney failure (HCC) 02/27/2019   Chronic combined systolic and diastolic CHF (congestive heart  failure) (HCC) 02/27/2019   CKD (chronic kidney disease), stage III (HCC) 05/16/2018   Normochromic normocytic anemia 05/15/2018   CAD (coronary artery disease) 12/17/2017   Hyperlipidemia LDL goal <70 12/17/2017   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   Cardiomyopathy, ischemic    Debility 10/04/2016   Stage 3 chronic kidney disease (HCC)    Disorientated    Anoxic brain injury (HCC) 09/15/2016   Benign essential HTN    OSA (obstructive sleep apnea)    Transaminitis    Chronic respiratory failure with hypoxia (HCC)    Fever    NSVT (nonsustained ventricular tachycardia) (HCC)    Uncomplicated asthma    Type 2 diabetes mellitus with obesity (HCC)    Acute on chronic combined systolic and diastolic CHF (congestive heart failure) (HCC)    Tachypnea    Hypokalemia    Leukocytosis    Hypoxemia    Acute respiratory failure (HCC)    Cardiopulmonary arrest (HCC) 08/27/2016   Class 2 obesity due to excess calories with body mass index (BMI) of 35.0 to 35.9 in adult 03/02/2014   Essential hypertension, benign 03/02/2014   PCP:  Ralene Ok, MD Pharmacy:   [facility] [phone] [place] Otto, [place] - [address] [place] [address] Phone: [phone] Fax: [phone]     Social Drivers of Health (SDOH) Social History: SDOH Screenings   Food Insecurity: No Food Insecurity (11/18/2023)  Housing: Low Risk  (11/18/2023)  Transportation Needs: No Transportation Needs (11/18/2023)  Utilities: At Risk (11/18/2023)  Social Connections: Moderately Integrated (11/17/2023)  Tobacco Use: Low Risk  (11/18/2023)   SDOH Interventions: Food Insecurity Interventions: Intervention Not Indicated, Inpatient TOC Housing Interventions: Intervention Not Indicated, Inpatient TOC Transportation Interventions: Intervention Not Indicated, Inpatient TOC Utilities Interventions: [employer] Provided, Inpatient TOC   Readmission Risk  Interventions     No data to display

## 2023-11-18 NOTE — Plan of Care (Signed)

## 2023-11-18 NOTE — Progress Notes (Signed)
  Progress Note   Patient: Dean Carll Sr. QMV:[id number] DOB: 30-Mar-1951 DOA: 11/17/2023     0 DOS: the patient was seen and examined on 11/18/2023 at 11:07AM      Brief hospital course: 73 y.o. M with hx VT and ICD, pAF on ELiquis, sCHF, CKD IIIb baseline 1.8, and DM who presented with post-polypectomy hematochezia.  Had polypectomy by hot snare with clips required 1 day PTA. Went home and had some scant blood on the toilet tissue until he took his home anticoagulant and then had large bright red blood in the toilet bowl.    In the ER, Hgb 6.7 g/dL.  He was transfused 1 unit and admitted.      Assessment and Plan: Acute blood loss anemia due to post polypectomy bleeding Iron stores replete Hemoglobin stable today at 7.9. Colonoscopy this morning showed clot adherent to his recent polypectomy site, this was washed clear with water, that there was active bleeding, and repeat clips were placed. -Serial H&H overnight - Hold Eliquis   Chronic systolic congestive heart failure Appears euvolemic EF 35-40% in 2023 - Continue Lasix - Hold ARB and spiro and BB  Chronic kidney disease stage IIIb Cr stable relative to baseline  Diabetes Diet controlled.  GLucose normal here  Hyperlipidemia - Continue Zetia, Lipitor  Hypertension BP soft - Hold Coreg, Valsartan, spironolactone  History of ventricular tachycardia Paroxysmal atrial fibrillation Rate controlled - Continue amiodarone - Hold carvedilol, spironolactone, valsartan        Subjective: No new symptoms.  No BMs overnight.   Feeling okay.  No dyspnea, no fever.     Physical Exam: BP (!) 107/50 (BP Location: Right Arm)   Pulse 72   Temp 97.8 F (36.6 C) (Oral)   Resp 18   Ht 5\' 6"  (1.676 m)   Wt 88.8 kg   SpO2 100%   BMI 31.59 kg/m   Adult male, lying in bed, interactive and appropriate RRR, soft systolic murmur, no peripheral edema, no JVD Respiratory rate normal, lungs clear without rales or  wheezes Abdomen soft no tenderness palpation or guarding, no ascites or distention Attention normal, affect normal, judgment and insight appear normal    Data Reviewed: Discussed with gastroenterology CBC shows hemoglobin improved up to 7.9 Creatinine 1.8, stable at baseline Sodium and potassium normal INR 1.8 Ferritin 87, send iron saturation 16%   Family Communication: None present    Disposition: Status is: Observation         Author: Alberteen Sam, MD 11/18/2023 3:07 PM  For on call review [email or web address].

## 2023-11-18 NOTE — Plan of Care (Signed)

## 2023-11-18 NOTE — Interval H&P Note (Signed)
 History and Physical Interval Note:  11/18/2023 7:17 AM  Dean Jasper Sr.  has presented today for surgery, with the diagnosis of Hematochezia, anemia.  The various methods of treatment have been discussed with the patient and family. After consideration of risks, benefits and other options for treatment, the patient has consented to  Procedure(s): COLONOSCOPY WITH PROPOFOL (N/A) as a surgical intervention.  The patient's history has been reviewed, patient examined, no change in status, stable for surgery.  I have reviewed the patient's chart and labs.  Questions were answered to the patient's satisfaction.     Lynann Bologna

## 2023-11-18 NOTE — Transfer of Care (Signed)
 Immediate Anesthesia Transfer of Care Note  Patient: Dean Jasper Sr.  Procedure(s) Performed: COLONOSCOPY WITH PROPOFOL HEMOSTASIS CLIP PLACEMENT HEMOSTASIS CONTROL  Patient Location: PACU  Anesthesia Type:MAC  Level of Consciousness: awake, alert , and oriented  Airway & Oxygen Therapy: Patient Spontanous Breathing  Post-op Assessment: Report given to RN and Post -op Vital signs reviewed and stable  Post vital signs: Reviewed and stable  Last Vitals:  Vitals Value Taken Time  BP 109/48 11/18/23 0815  Temp 37.1 C 11/18/23 0813  Pulse 79 11/18/23 0817  Resp 17 11/18/23 0817  SpO2 99 % 11/18/23 0817  Vitals shown include unfiled device data.  Last Pain:  Vitals:   11/18/23 0813  TempSrc:   PainSc: 0-No pain         Complications: No notable events documented.

## 2023-11-19 ENCOUNTER — Encounter (HOSPITAL_COMMUNITY): Payer: Self-pay | Admitting: Gastroenterology

## 2023-11-19 DIAGNOSIS — K922 Gastrointestinal hemorrhage, unspecified: Secondary | ICD-10-CM | POA: Diagnosis not present

## 2023-11-19 LAB — HEMOGLOBIN AND HEMATOCRIT, BLOOD
HCT: 22.7 % — ABNORMAL LOW (ref 39.0–52.0)
HCT: 28.5 % — ABNORMAL LOW (ref 39.0–52.0)
Hemoglobin: 7.1 g/dL — ABNORMAL LOW (ref 13.0–17.0)
Hemoglobin: 8.8 g/dL — ABNORMAL LOW (ref 13.0–17.0)

## 2023-11-19 LAB — BASIC METABOLIC PANEL
Anion gap: 9 (ref 5–15)
BUN: 27 mg/dL — ABNORMAL HIGH (ref 8–23)
CO2: 21 mmol/L — ABNORMAL LOW (ref 22–32)
Calcium: 8.9 mg/dL (ref 8.9–10.3)
Chloride: 106 mmol/L (ref 98–111)
Creatinine, Ser: 1.64 mg/dL — ABNORMAL HIGH (ref 0.61–1.24)
GFR, Estimated: 44 mL/min — ABNORMAL LOW (ref >60)
Glucose, Bld: 95 mg/dL (ref 70–99)
Potassium: 3.6 mmol/L (ref 3.5–5.1)
Sodium: 136 mmol/L (ref 135–145)

## 2023-11-19 LAB — CBC
HCT: 24.2 % — ABNORMAL LOW (ref 39.0–52.0)
Hemoglobin: 7.4 g/dL — ABNORMAL LOW (ref 13.0–17.0)
MCH: 28.1 pg (ref 26.0–34.0)
MCHC: 30.6 g/dL (ref 30.0–36.0)
MCV: 92 fL (ref 80.0–100.0)
Platelets: 146 10*3/uL — ABNORMAL LOW (ref 150–400)
RBC: 2.63 MIL/uL — ABNORMAL LOW (ref 4.22–5.81)
RDW: 16.5 % — ABNORMAL HIGH (ref 11.5–15.5)
WBC: 10.9 10*3/uL — ABNORMAL HIGH (ref 4.0–10.5)
nRBC: 0 % (ref 0.0–0.2)

## 2023-11-19 LAB — PREPARE RBC (CROSSMATCH)

## 2023-11-19 MED ORDER — SODIUM CHLORIDE 0.9% IV SOLUTION: Freq: Once | INTRAVENOUS | Status: AC

## 2023-11-19 MED ORDER — FUROSEMIDE 10 MG/ML IJ SOLN
20.0000 mg | Freq: Once | INTRAMUSCULAR | Status: AC
  Administered 2023-11-19: 20 mg via INTRAVENOUS
  Filled 2023-11-19: qty 2

## 2023-11-19 NOTE — Progress Notes (Signed)
 No ICM remote transmission received for 11/19/2023 due to hospitalization and next ICM transmission scheduled for 12/03/2023.

## 2023-11-19 NOTE — Progress Notes (Signed)
  Progress Note   Patient: Dean [name] Sr. ZOX:[id number] DOB: [birth year] DOA: 11/17/2023     0 DOS: the patient was seen and examined on 11/19/2023 at 11:15AM      Brief hospital course: 73 y.o. M with hx VT and ICD, pAF on ELiquis, sCHF, CKD IIIb baseline 1.8, and DM who presented with post-polypectomy hematochezia.       Assessment and Plan: Acute blood loss anemia due to post polypectomy bleeding 6.7, transfused 2 units.    Underwent colonoscopy that showed adherent clot to the recent polypectomy site, after clot removal, there was active bleeding, repeat clip was placed, and appears Diegel.    Hemoglobin down to 7.1 this afternoon, bowel movements are brown, will repeat blood transfusion. - Transfuse 1 unit - Serial H&H - Hold Eliquis   Congestive heart failure Still appears euvolemic, EF 35 to 40% 2023 - Continue Lasix - Hold ARB, spironolactone, beta-blocker  Diabetes Diet controlled, glucose normal here  Hypertension Blood pressure trending up but diastolic still low - Continue Lasix - Hold carvedilol, spironolactone, valsartan  History of ventricular tachycardia Paroxysmal atrial fibrillation Rate is controlled - Continue amiodarone - Hold carvedilol, valsartan, spironolactone  Hyperlipidemia - Continue Zetia and Lipitor  Chronic kidney disease stage IIIb Creatinine stable relative to baseline        Subjective: Patient is feeling okay, has had brown bowel movements.  He is still quite weak.  No fever, no abdominal pain.     Physical Exam: BP (!) 135/56 (BP Location: Right Arm)   Pulse 86   Temp 98.6 F (37 C) (Oral)   Resp 18   Ht 5\' 6"  (1.676 m)   Wt 88.8 kg   SpO2 100%   BMI 31.59 kg/m   Thin adult male, lying in bed, interactive and appropriate RRR, soft systolic murmur, no peripheral edema, no JVD Respiratory rate normal, lungs clear without rales or wheezes Abdomen soft without tenderness palpation or guarding, no ascites  or distention Attention normal, affect normal, judgment and insight appear normal, face symmetric, speech fluent, moves all extremities with normal strength and coordination    Data Reviewed: Discussed with gastroenterology CBC shows hemoglobin down to 7.1 this afternoon, creatinine stable at 1.7 Sodium and potassium normal   Family Communication: None present    Disposition: To home when bleeding stopped and hgb stable        Author: Alberteen Sam, MD 11/19/2023 3:31 PM  For on call review [email or web address].

## 2023-11-20 ENCOUNTER — Encounter (HOSPITAL_COMMUNITY): Payer: Self-pay | Admitting: Gastroenterology

## 2023-11-20 DIAGNOSIS — K922 Gastrointestinal hemorrhage, unspecified: Secondary | ICD-10-CM | POA: Diagnosis not present

## 2023-11-20 LAB — TYPE AND SCREEN
ABO/RH(D): O POS
Antibody Screen: NEGATIVE
Unit division: 0
Unit division: 0
Unit division: 0

## 2023-11-20 LAB — CBC
HCT: 28.8 % — ABNORMAL LOW (ref 39.0–52.0)
Hemoglobin: 8.8 g/dL — ABNORMAL LOW (ref 13.0–17.0)
MCH: 27.8 pg (ref 26.0–34.0)
MCHC: 30.6 g/dL (ref 30.0–36.0)
MCV: 91.1 fL (ref 80.0–100.0)
Platelets: 153 10*3/uL (ref 150–400)
RBC: 3.16 MIL/uL — ABNORMAL LOW (ref 4.22–5.81)
RDW: 16.9 % — ABNORMAL HIGH (ref 11.5–15.5)
WBC: 12.6 10*3/uL — ABNORMAL HIGH (ref 4.0–10.5)
nRBC: 0 % (ref 0.0–0.2)

## 2023-11-20 LAB — BPAM RBC
Blood Product Expiration Date: 202503242359
Blood Product Expiration Date: 202503202359
Blood Product Expiration Date: 202503202359
ISSUE DATE / TIME: 202502241614
ISSUE DATE / TIME: 202502221641
ISSUE DATE / TIME: 202502222006
Unit Type and Rh: 5100
Unit Type and Rh: 5100
Unit Type and Rh: 5100

## 2023-11-20 MED ORDER — METOPROLOL TARTRATE 5 MG/5ML IV SOLN
5.0000 mg | Freq: Once | INTRAVENOUS | Status: DC
  Filled 2023-11-20: qty 5

## 2023-11-20 NOTE — TOC Transition Note (Signed)
 Transition of Care [facility]) - Discharge Note   Patient Details  Name: Dean Leopard Sr. MRN: [id number] Date of Birth: 1951/05/16  Transition [facility] Care [facility]) CM/SW Contact:  Lanier Clam, RN Phone Number: 11/20/2023, 9:54 AM   Clinical Narrative:   d/c home no further CM needs.    Final next level of care: Home/Self Care Barriers to Discharge: No Barriers Identified   Patient Goals and CMS Choice Patient states their goals for this hospitalization and ongoing recovery are:: home CMS Medicare.gov Compare Post Acute Care list provided to:: Patient        Discharge Placement                       Discharge Plan and Services Additional resources added to the After Visit Summary for     Discharge Planning Services: CM Consult                                 Social Drivers of Health (SDOH) Interventions SDOH Screenings   Food Insecurity: No Food Insecurity (11/18/2023)  Housing: Low Risk  (11/18/2023)  Transportation Needs: No Transportation Needs (11/18/2023)  Utilities: At Risk (11/18/2023)  Social Connections: Moderately Integrated (11/17/2023)  Tobacco Use: Low Risk  (11/18/2023)     Readmission Risk Interventions     No data to display

## 2023-11-20 NOTE — Discharge Summary (Signed)
 Physician Discharge Summary   Patient: Dean [name]. MRN: [id number] DOB: Oct 03, 1950  Admit date:     11/17/2023  Discharge date: 11/20/23  Discharge Physician: Alberteen Sam   PCP: Ralene Ok, MD     Recommendations at discharge:  Follow up with PCP Dr. Ludwig Clarks for post-polypectomy bleeding Dr. Ludwig Clarks: Please check CBC and BMP in 1 week (discharge creatinine 1.64, discharge hemoglobin 8.8) Check blood pressure and resume valsartan if appropriate Confirm patient has resumed Eliquis     Discharge Diagnoses: Principal Problem:   Acute GI bleeding Active Problems:   Chronic combined systolic and diastolic CHF (congestive heart failure) (HCC)   Type 2 diabetes mellitus with obesity (HCC)   ICD (implantable cardioverter-defibrillator) in place   CKD (chronic kidney disease), stage III (HCC)   Atrial fibrillation, chronic [facility])      Hospital Course: 73 y.o. M with hx VT and ICD, pAF on ELiquis, sCHF, CKD IIIb baseline 1.8, and DM who presented with post-polypectomy hematochezia.  Had polypectomy by hot snare with clips required 1 day PTA. Went home and had some scant blood on the toilet tissue until he took his home anticoagulant and then had large bright red blood in the toilet bowl.    In the ER, Hgb 6.7 g/dL.  He was transfused 1 unit and admitted.     Acute blood loss anemia due to post polypectomy bleeding On admission hemoglobin 6.7.  Transfused 2 units.  Iron stores checked in room.  Replete.  Underwent colonoscopy that showed adherent clot to the recent polypectomy site, after clot removal there was active bleeding, so repeat clip was placed as well as hemostatic gel.  Post colonoscopy, his hemoglobin trended down to 7.1, so 1 additional unit of blood was administered.  Posttransfusion his hemoglobin was stable overnight and he had no further bloody bowel movements, so he was discharged home with close PCP follow-up.  Hold Eliquis 72 hours        Chronic systolic congestive heart failure Appears euvolemic.  He was treated with Lasix, spironolactone, and beta-blocker.  His ARB was held at discharge due to soft blood pressure.    History of ventricular tachycardia Paroxysmal atrial fibrillation Rate controlled.  He was continued on amiodarone, carvedilol.  Eliquis held at discharge 72 hours.  Chronic kidney disease stage IIIb Creatinine stable relative to baseline 1.6-1.8          The [facility] Controlled Substances Registry was reviewed for this patient prior to discharge.  Consultants: Gastroenterology Dr. Chales Abrahams Procedures performed: Colonoscopy   Disposition: Home health Diet recommendation:  Cardiac and Carb modified diet  DISCHARGE MEDICATION: Allergies as of 11/20/2023       Reactions   Contrast Media [iodinated Contrast Media] Shortness Of Breath, Nausea And Vomiting   Lisinopril Cough        Medication List     PAUSE taking these medications    Eliquis 5 MG Tabs tablet Wait to take this until: November 22, 2023 Generic drug: apixaban TAKE 1 TABLET BY MOUTH TWICE A DAY   valsartan 160 MG tablet Wait to take this until your doctor or other care provider tells you to start again. Commonly known as: Diovan Take 1 tablet (160 mg total) by mouth daily.       TAKE these medications    allopurinol 300 MG tablet Commonly known as: ZYLOPRIM Take 300 mg by mouth daily.   amiodarone 200 MG tablet Commonly known as: PACERONE Take 1 tablet (200 mg  total) by mouth daily.   atorvastatin 40 MG tablet Commonly known as: LIPITOR Take 1 tablet (40 mg total) by mouth daily at 6 PM. What changed: when to take this   carvedilol 12.5 MG tablet Commonly known as: COREG TAKE 1 TABLET (12.5MG  TOTAL) BY MOUTH TWICE A DAY WITH MEALS   cholecalciferol 25 MCG (1000 UNIT) tablet Commonly known as: VITAMIN D3 Take 1,000 Units by mouth daily.   ezetimibe 10 MG tablet Commonly known as: ZETIA Take 1  tablet (10 mg total) by mouth daily.   ferrous sulfate 325 (65 FE) MG tablet Take 325 mg by mouth daily.   furosemide 20 MG tablet Commonly known as: LASIX Take 20 mg by mouth daily. Takes along with 40 mg to total 60 mg daily What changed: Another medication with the same name was changed. Make sure you understand how and when to take each.   furosemide 40 MG tablet Commonly known as: LASIX Take 1 tablet (40 mg total) by mouth daily. What changed: additional instructions   magnesium oxide 400 (240 Mg) MG tablet Commonly known as: MAG-OX TAKE 1 TABLET BY MOUTH EVERY DAY   spironolactone 25 MG tablet Commonly known as: ALDACTONE Take 0.5 tablets (12.5 mg total) by mouth daily. What changed: how much to take        Follow-up Information     Ralene Ok, MD. Schedule an appointment as soon as possible for a visit in 1 week(s).   Specialty: Internal Medicine Contact information: [address] [place] [address] [phone]                 Discharge Instructions     Discharge instructions   Complete by: As directed    **IMPORTANT DISCHARGE INSTRUCTIONS**   From Dr. Maryfrances Bunnell: You were admitted for bleeding and anemia (low blood levels)  Here, we found that this bleeding was from where your polypectomy was  Dr. Chales Abrahams placed another clip and then some gel to stop bleeding  You received a blood transfusion and now your hemoglobin appears stable and the bleeding appears to have adequately stopped  CONTINUE your iron pills (ferrous sulfate 325 mg)  HOLD (do not take) your Eliquis for 2 more days, you may resume on Thursday Watch for further bleeding  Resume all your other medicines EXCEPT do not restart your valsartan yet Go see your primary doctor in 1 week and have them check your blood pressure, kidney function and hemoglobin  Ask them if you should restart valsartan   Increase activity slowly   Complete by: As directed        Discharge  Exam: Filed Weights   11/17/23 1952 11/18/23 0444 11/20/23 0528  Weight: 90 kg 88.8 kg 88.4 kg    General: Pt is alert, awake, not in acute distress Cardiovascular: RRR, nl S1-S2, no murmurs appreciated.   No LE edema.   Respiratory: Normal respiratory rate and rhythm.  CTAB without rales or wheezes. Abdominal: Abdomen soft and non-tender.  No distension or HSM.   Neuro/Psych: Strength symmetric in upper and lower extremities.  Judgment and insight appear normal.   Condition at discharge: good  The results of significant diagnostics from this hospitalization (including imaging, microbiology, ancillary and laboratory) are listed below for reference.   Imaging Studies: No results found.  Microbiology: Results for orders placed or performed during the hospital encounter of 08/12/20  Respiratory Panel by RT PCR (Flu A&B, Covid) - Nasopharyngeal Swab     Status: None  Collection Time: 08/12/20  7:38 AM   Specimen: Nasopharyngeal Swab  Result Value Ref Range Status   SARS Coronavirus 2 by RT PCR NEGATIVE NEGATIVE Final    Comment: (NOTE) SARS-CoV-2 target nucleic acids are NOT DETECTED.  The SARS-CoV-2 RNA is generally detectable in upper respiratoy specimens during the acute phase of infection. The lowest concentration of SARS-CoV-2 viral copies this assay can detect is 131 copies/mL. A negative result does not preclude SARS-Cov-2 infection and should not be used as the sole basis for treatment or other patient management decisions. A negative result may occur with  improper specimen collection/handling, submission of specimen other than nasopharyngeal swab, presence of viral mutation(s) within the areas targeted by this assay, and inadequate number of viral copies (<131 copies/mL). A negative result must be combined with clinical observations, patient history, and epidemiological information. The expected result is Negative.  Fact Sheet for Patients:   https://www.moore.com/  Fact Sheet for Healthcare Providers:  https://www.young.biz/  This test is no t yet approved or cleared by the [place] FDA and  has been authorized for detection and/or diagnosis of SARS-CoV-2 by FDA under an Emergency Use Authorization (EUA). This EUA will remain  in effect (meaning this test can be used) for the duration of the COVID-19 declaration under Section 564(b)(1) of the Act, 21 U.S.C. section 360bbb-3(b)(1), unless the authorization is terminated or revoked sooner.     Influenza A by PCR NEGATIVE NEGATIVE Final   Influenza B by PCR NEGATIVE NEGATIVE Final    Comment: (NOTE) The Xpert Xpress SARS-CoV-2/FLU/RSV assay is intended as an aid in  the diagnosis of influenza from Nasopharyngeal swab specimens and  should not be used as a sole basis for treatment. Nasal washings and  aspirates are unacceptable for Xpert Xpress SARS-CoV-2/FLU/RSV  testing.  Fact Sheet for Patients: https://www.moore.com/  Fact Sheet for Healthcare Providers: https://www.young.biz/  This test is not yet approved or cleared by the [place] FDA and  has been authorized for detection and/or diagnosis of SARS-CoV-2 by  FDA under an Emergency Use Authorization (EUA). This EUA will remain  in effect (meaning this test can be used) for the duration of the  Covid-19 declaration under Section 564(b)(1) of the Act, 21  U.S.C. section 360bbb-3(b)(1), unless the authorization is  terminated or revoked. Performed at [facility], [address] [place], [place] [address]   SARS Coronavirus 2 by RT PCR (hospital order, performed in [facility] hospital lab) Nasopharyngeal Nasopharyngeal Swab     Status: None   Collection Time: 09/01/20  3:32 PM   Specimen: Nasopharyngeal Swab  Result Value Ref Range Status   SARS Coronavirus 2 NEGATIVE NEGATIVE Final    Comment: (NOTE) SARS-CoV-2  target nucleic acids are NOT DETECTED.  The SARS-CoV-2 RNA is generally detectable in upper and lower respiratory specimens during the acute phase of infection. The lowest concentration of SARS-CoV-2 viral copies this assay can detect is 250 copies / mL. A negative result does not preclude SARS-CoV-2 infection and should not be used as the sole basis for treatment or other patient management decisions.  A negative result may occur with improper specimen collection / handling, submission of specimen other than nasopharyngeal swab, presence of viral mutation(s) within the areas targeted by this assay, and inadequate number of viral copies (<250 copies / mL). A negative result must be combined with clinical observations, patient history, and epidemiological information.  Fact Sheet for Patients:   BoilerBrush.com.cy  Fact Sheet for Healthcare Providers:  https://pope.com/  This test is not yet approved or  cleared by the [place] and has been authorized for detection and/or diagnosis of SARS-CoV-2 by FDA under an Emergency Use Authorization (EUA).  This EUA will remain in effect (meaning this test can be used) for the duration of the COVID-19 declaration under Section 564(b)(1) of the Act, 21 U.S.C. section 360bbb-3(b)(1), unless the authorization is terminated or revoked sooner.  Performed at [facility], [address], [place], [place] [address]     Labs: CBC: Recent Labs  Lab 11/17/23 1343 11/18/23 0238 11/18/23 0239 11/19/23 0503 11/19/23 1401 11/19/23 2235 11/20/23 0515  WBC 10.0  --  10.9* 10.9*  --   --  12.6*  NEUTROABS 6.9  --   --   --   --   --   --   HGB 6.7*   < > 7.9* 7.4* 7.1* 8.8* 8.8*  HCT 22.8*   < > 25.5* 24.2* 22.7* 28.5* 28.8*  MCV 95.8  --  90.1 92.0  --   --  91.1  PLT 163  --  130* 146*  --   --  153   < > = values in this interval not displayed.   Basic Metabolic Panel: Recent  Labs  Lab 11/17/23 1343 11/18/23 0239 11/19/23 0503  NA 135 142 136  K 3.1* 4.0 3.6  CL 105 110 106  CO2 20* 23 21*  GLUCOSE 88 94 95  BUN 30* 35* 27*  CREATININE 1.70* 1.81* 1.64*  CALCIUM 8.8* 8.9 8.9  MG  --  1.8  --    Liver Function Tests: Recent Labs  Lab 11/17/23 1343  AST 18  ALT 14  ALKPHOS 54  BILITOT 0.4  PROT 5.8*  ALBUMIN 3.0*   CBG: Recent Labs  Lab 11/18/23 0727 11/18/23 0816  GLUCAP 74 78    Discharge time spent: approximately 45 minutes spent on discharge counseling, evaluation of patient on day of discharge, and coordination of discharge planning with nursing, social work, pharmacy and case management  Signed: Alberteen Sam, MD [facility] 11/20/2023

## 2023-11-21 ENCOUNTER — Ambulatory Visit: Payer: No Typology Code available for payment source | Attending: Cardiology

## 2023-11-21 ENCOUNTER — Telehealth: Payer: Self-pay

## 2023-11-21 ENCOUNTER — Telehealth: Payer: Self-pay | Admitting: Internal Medicine

## 2023-11-21 DIAGNOSIS — Z95810 Presence of automatic (implantable) cardiac defibrillator: Secondary | ICD-10-CM | POA: Diagnosis not present

## 2023-11-21 DIAGNOSIS — I5022 Chronic systolic (congestive) heart failure: Secondary | ICD-10-CM | POA: Diagnosis not present

## 2023-11-21 LAB — CUP PACEART REMOTE DEVICE CHECK
Battery Remaining Longevity: 37 mo
Battery Voltage: 2.97 V
Brady Statistic RV Percent Paced: 0.01 %
Date Time Interrogation Session: 20250221144001
HighPow Impedance: 39 Ohm
Implantable Lead Connection Status: 753985
Implantable Lead Implant Date: 20180116
Implantable Lead Location: 753860
Implantable Lead Model: 6935
Implantable Pulse Generator Implant Date: 20180116
Lead Channel Impedance Value: 247 Ohm
Lead Channel Impedance Value: 304 Ohm
Lead Channel Pacing Threshold Amplitude: 1 V
Lead Channel Pacing Threshold Pulse Width: 0.4 ms
Lead Channel Sensing Intrinsic Amplitude: 9.375 mV
Lead Channel Sensing Intrinsic Amplitude: 9.375 mV
Lead Channel Setting Pacing Amplitude: 2.5 V
Lead Channel Setting Pacing Pulse Width: 0.4 ms
Lead Channel Setting Sensing Sensitivity: 0.3 mV
Zone Setting Status: 755011
Zone Setting Status: 755011

## 2023-11-21 NOTE — Progress Notes (Signed)
 EPIC Encounter for ICM Monitoring  Patient Name: Dean Cayson Sr. is a 73 y.o. male Date: 11/21/2023 Primary Care Physican: Ralene Ok, MD Primary Cardiologist: [facility] Electrophysiologist: Elberta Fortis Nephrologist:  [facility] 01/31/2023 Weight:   183 lbs 06/21/2023 Weight: 183 lbs 07/24/2023 Weight: 183-184 lbs 11/20/2023 Hospital Discharge Weight: 190 lbs   Since 16-Nov-2023 Time in AF   0.0 hr/day (0.0%)(taking Eliquis)   Spoke with patient and heart failure questions reviewed.  Transmission results reviewed.  Pt reports increased HR, SOB and cough over the last few days.  He was hospitalized 2/22-2/25 for GI bleed following colonoscopy.   HGB dropped to 6.7 on 2/22 and was up to 8.8 at 2/25 discharge.    Diet: Typically does not limit salt intake.  Eats restaurant foods 2-3 times a week.   Optivol Thoracic impedance suggesting chronic possible fluid accumulation starting 06/2023.  Fluid index greater than normal threshold starting 10/24.     Prescribed:  Furosemide 40 mg Take 1 tablet (40 mg total) by mouth daily. Per 10/23/2022 ICM note from Dr Rennis Golden he can take an extra 40 mg lasix as needed up to 3 days if weight goes up or he develops swelling.   Spironolactone 25 mg take 1 tablet by mouth daily Eliquis 5 mg take 1 tablet twice a day   Labs: 07/05/2023 Creatinine 2.03, BUN 50, Potassium 4.3, Sodium 139, GFR 34 11/07/2022 Creatinine 1.78, BUN 42, Potassium 4.8, Sodium 142, GFR 40, BNP 2,422 A complete set of results can be found in Results Review.   Recommendations:   Pt called Dr Blanchie Dessert office to report symptoms and question if he should restart Valsartan (was stopped at hospital discharge per patient).  He has post hospital OV follow up 3/4.   Follow-up plan: ICM clinic phone appointment on 12/03/2023 to recheck fluid levels.   91 day device clinic remote transmission 02/14/2024.     EP/Cardiology Office Visits:   11/27/2023 with Robet Leu, PA.  02/06/2024 with Dr  Rennis Golden.  Recall 07/08/2024 with Dr Elberta Fortis.   Copy of ICM check sent to Dr. Elberta Fortis and Robet Leu, PA as Lorain Childes since patient has an appointment 3/4.  3 month ICM trend: 11/21/2023.    12-14 Month ICM trend:     Karie Soda, RN 11/21/2023 1:05 PM

## 2023-11-21 NOTE — Telephone Encounter (Signed)
 Pt c/o Shortness Of Breath: STAT if SOB developed within the last 24 hours or pt is noticeably SOB on the phone  1. Are you currently SOB (can you hear that pt is SOB on the phone)? No   2. How long have you been experiencing SOB? Month   3. Are you SOB when sitting or when up moving around? Laying down and when he gets up it stops   4. Are you currently experiencing any other symptoms? No    Pt spoke to nurse this morning and was told to ask if he should restart Valsartan, please advise.

## 2023-11-21 NOTE — Progress Notes (Signed)
 [facility]link report reviewed by Ancil Boozer, RN device clinic.  Pt does show increase in HR this patient week which may be related to low hgb from the GI bleed.  Will continue to monitor.  Message sent to Dr Elberta Fortis to check if patient needs an updated echo since last echo was 2023.

## 2023-11-21 NOTE — Telephone Encounter (Signed)
 Returned call to patient as requested by voice mail message.  He was hospitalized 2/22-2/25 for bleeding after colonoscopy.  He reports he intermittent days with fast heart rate for the last 2 months.  He also reports SOB and cough over the last couple of days.  He stated Valsartan was stopped at hospital discharge 2/25 and was told to follow up with cardiologist.  He will send a remote transmission for review.    Advised to call Dr [facility] office today to inform of 2/25 discharge, symptoms and if he should restart Valsartan and he agreed to do so.

## 2023-11-21 NOTE — Telephone Encounter (Signed)
 Patient identification verified by 2 forms. Marilynn Rail, RN    Called and spoke to patient  Patient states:   -feels like he is having SOB on and off   -his heart rate was elevated yesterday prior to discharge   -when he lays down heart rate increases a bit   -symptoms started yesterday after discharge   -symptoms resolved on their own   -he also has some coughing   -at this time he feels okay  Patient denies:   -SOB/difficulty breathing   -chest pain   -persistent headache  Patient scheduled for hospital follow up 3/4 at 1:30pm  Reviewed ED warning signs/precautions  Patient verbalized understanding, no questions at this time

## 2023-11-21 NOTE — Telephone Encounter (Signed)
 Attempted return call after receiving remote transmission and no answer.

## 2023-11-26 NOTE — Progress Notes (Unsigned)
 Cardiology Office Note:  .   Date:  11/27/2023  ID:  Dean Jasper Sr., DOB 1951/09/08, MRN [id number] PCP: Ralene Ok, MD  [facility] Providers Cardiologist:  Chrystie Nose, MD Cardiology APP:  Jodelle Gross, NP  Electrophysiologist:  Regan Lemming, MD {  History of Present Illness: .   Dean Vickrey Sr. is a 73 y.o. male with a past medical history of chronic systolic heart failure/ischemic cardiomyopathy, LBBB, history of VT s/p medtronic single-lead ICD in 2018, CAD, HTN, HLD, type 2 DM, PAF, OSA on CPAP.   Patient had a VT arrest in 08/2016 that required CPR and defibrillation. Echocardiogram 08/28/16 showed EF 35-40%, severe LAE, mild AI, mild MR. Cardiac catheterization on 09/07/16 demonstrated sing-vessel CAD with 90% ostial RCA disease, 100% mid-distal RCA occlusion with L to R collaterals. Recommended medical management. Underwent single-lead Medtronic ICD implantation in 09/2016. Repeat echocardiograms in 04/2018, 03/2019, and 04/2020 continued to showed EF of 30-35%. ICD interrogation has since revealed paroxysmal atrial fibrillation and patient was started on eliquis.   Patient later admitted in 07/2022 following an ICD shock. Echocardiogram 08/25/22 showed EF 35-40%, akinesis of the inferolateral wall, grade I DD, mildly elevated PASP, moderate LAE, moderate MR, moderate AI. Device interrogation revealed VT/V-fib with maximal heart rate of 231 BPM. He had 2 aborted discharges followed by 1 successful discharge. Hospital course was complicated by AKI on CKD, low magnesium. He had been out of his amiodarone for one week, and he was reloaded with IV amiodarone.   Patient was seen by gen cards in follow up on 11/27/22. At that time, patient's BP was elevated and he was restarted on entresto. He was euvolemic on exam and device function was stable.   He was last seen by EP on 07/19/23. At that time, patient had been feeling well. He did not have anginal symptoms.  Remained on BB, statin therapy, entrestro, spiro, lasix, eliquis.   Patient recently admitted from 2/22-2/25. He underwent colonoscopy with polypectomy by hot snare with clips on 2/22. He went home and had tool his anticoagulation, then had a large amount of bright red blood in the toilet. In the ED, hemoglobin 6.7. He was transfused 1 unit and admitted. He underwent repeat colonoscopy that showed adherent clot to the recent polypectomy site, after clot removal there was active bleeding, so repeat clip and hemostatic gel were placed. His ARB was held due to low BP. Eliquis was held for 3 days post discharge.   He called the office after being discharged with some feelings of elevated HR. Device check did show some elevated HR, likely related to his GI bleed. "Optivol Thoracic impedance suggesting chronic possible fluid accumulation starting 06/2023.  Fluid index greater than normal threshold starting 10/24 "   Today, patient presents for a hospital follow up appointment. Patient has a known history of chronic anemia, heart failure, and kidney disease. Reports that he has been feeling fatigued and weak following his recent GI bleed. He reports feeling "drained" and "weak," with these symptoms being particularly pronounced since his recent GI bleed. Since he was discharged, he has not had any blood in his stools. He is back on eliquis and denies any bleeding. He describes difficulty with activities such as walking up stairs, attributing this to weakness rather than shortness of breath. He denies shortness of breath, cough, orthopnea, chest pain. He has a bit of ankle swelling at night, but it always goes away by the morning.   Patient's PCP  checked labs a few days ago. Showed hemoglobin stable at 8.8, creatinine stable at 1.64. He reports that his liver function was abnormal, but these labs are not available for my review. His PCP told him to hold atorvastatin.   The patient also reports a history of  coughing when he was on Entresto. He was recently switched to valsartan, which he has only been taking for a short period of time.  Valsartan was held when he was discharged from the hospital because his BP was low. Since discharge, he denies any dizziness, syncope, near syncope. BP improved today, 144/62 initially and 138/60 on recheck.     ROS: Denies chest pain, shortness of breath, dizziness, syncope, near syncope. He has continued to feel weak/fatigued since his admission. Has occasional, mild lower extremity swelling at night that improves by the AM. Denies abdominal distention, palpitations   Studies Reviewed: .   Cardiac Studies & Procedures   ______________________________________________________________________________________________ CARDIAC CATHETERIZATION  CARDIAC CATHETERIZATION 09/07/2016  Narrative  There is moderate left ventricular systolic dysfunction.  LV end diastolic pressure is normal.  The left ventricular ejection fraction is 35-45% by visual estimate.  Ost RCA lesion, 90 %stenosed.  Mid RCA to Dist RCA lesion, 100 %stenosed.  1. Single vessel occlusive CAD. The distal RCA is occluded with left to right collaterals. There is a 90% ostial stenosis 2. Moderate LV dysfunction with EF 35-40%. Akinesis of the basal to mid inferior wall 3. Normal LVEDP  Plan: medical therapy. If he has significant angina we could consider PCI of the RCA but there appears to be significant scar of the inferior wall.  Findings Coronary Findings Diagnostic  Dominance: Right  Right Coronary Artery  Right Posterior Descending Artery Collaterals RPDA filled by collaterals from 2nd Sept.  Intervention  No interventions have been documented.     ECHOCARDIOGRAM  ECHOCARDIOGRAM COMPLETE 08/25/2022  Narrative ECHOCARDIOGRAM REPORT    Patient Name:   Dean Sena Sr. Date of Exam: 08/25/2022 Medical Rec #:  [id number]            Height:       66.0 in Accession #:     [id number]           Weight:       186.0 lb Date of Birth:  1951-09-23             BSA:          1.939 m Patient Age:    71 years             BP:           129/99 mmHg Patient Gender: M                    HR:           50 bpm. Exam Location:  Inpatient  Procedure: 2D Echo, 3D Echo, Cardiac Doppler, Color Doppler and Intracardiac Opacification Agent  Indications:    R94.31 Abnormal EKG; I47.2 Ventricular tachycardia  History:        Patient has prior history of Echocardiogram examinations, most recent 12/07/2021. CHF, Abnormal ECG and Defibrillator, Arrythmias:Cardiac Arrest and VT; Risk Factors:Hypertension.  Sonographer:    Sheralyn Boatman RDCS Referring Phys: Nicolasa Ducking, RONALD   Sonographer Comments: Patient is obese. Image acquisition challenging due to patient body habitus. IMPRESSIONS   1. Akinesis of the inferolateral wall with overall moderate LV dysfunction. 2. Left ventricular ejection fraction, by estimation, is 35 to 40%. The left ventricle  has moderately decreased function. The left ventricle demonstrates regional wall motion abnormalities (see scoring diagram/findings for description). The left ventricular internal cavity size was moderately dilated. Left ventricular diastolic parameters are consistent with Grade I diastolic dysfunction (impaired relaxation). Elevated left atrial pressure. 3. Right ventricular systolic function is normal. The right ventricular size is normal. There is mildly elevated pulmonary artery systolic pressure. 4. Left atrial size was moderately dilated. 5. The mitral valve is normal in structure. Moderate mitral valve regurgitation. No evidence of mitral stenosis. 6. The aortic valve has an indeterminant number of cusps. Aortic valve regurgitation is moderate. No aortic stenosis is present. 7. Aortic dilatation noted. There is borderline dilatation of the aortic root, measuring 39 mm. 8. The inferior vena cava is normal in size with greater than  50% respiratory variability, suggesting right atrial pressure of 3 mmHg.  FINDINGS Left Ventricle: Left ventricular ejection fraction, by estimation, is 35 to 40%. The left ventricle has moderately decreased function. The left ventricle demonstrates regional wall motion abnormalities. The left ventricular internal cavity size was moderately dilated. There is no left ventricular hypertrophy. Left ventricular diastolic parameters are consistent with Grade I diastolic dysfunction (impaired relaxation). Elevated left atrial pressure.  Right Ventricle: The right ventricular size is normal. Right ventricular systolic function is normal. There is mildly elevated pulmonary artery systolic pressure. The tricuspid regurgitant velocity is 2.73 m/s, and with an assumed right atrial pressure of 8 mmHg, the estimated right ventricular systolic pressure is 37.8 mmHg.  Left Atrium: Left atrial size was moderately dilated.  Right Atrium: Right atrial size was normal in size.  Pericardium: There is no evidence of pericardial effusion.  Mitral Valve: The mitral valve is normal in structure. Moderate mitral valve regurgitation. No evidence of mitral valve stenosis.  Tricuspid Valve: The tricuspid valve is normal in structure. Tricuspid valve regurgitation is mild . No evidence of tricuspid stenosis.  Aortic Valve: The aortic valve has an indeterminant number of cusps. Aortic valve regurgitation is moderate. Aortic regurgitation PHT measures 639 msec. No aortic stenosis is present. Aortic valve mean gradient measures 5.0 mmHg. Aortic valve peak gradient measures 9.1 mmHg. Aortic valve area, by VTI measures 2.35 cm.  Pulmonic Valve: The pulmonic valve was not well visualized. Pulmonic valve regurgitation is not visualized. No evidence of pulmonic stenosis.  Aorta: Aortic dilatation noted. There is borderline dilatation of the aortic root, measuring 39 mm.  Venous: The inferior vena cava is normal in size with  greater than 50% respiratory variability, suggesting right atrial pressure of 3 mmHg.  IAS/Shunts: No atrial level shunt detected by color flow Doppler.  Additional Comments: Akinesis of the inferolateral wall with overall moderate LV dysfunction. A device lead is visualized.   LEFT VENTRICLE PLAX 2D LVIDd:         6.00 cm      Diastology LVIDs:         5.20 cm      LV e' medial:    3.89 cm/s LV PW:         1.10 cm      LV E/e' medial:  26.0 LV IVS:        1.20 cm      LV e' lateral:   8.19 cm/s LVOT diam:     2.30 cm      LV E/e' lateral: 12.3 LV SV:         93 LV SV Index:   48 LVOT Area:     4.15 cm  3D Volume EF: LV Volumes (MOD)            3D EF:        32 % LV vol d, MOD A2C: 224.0 ml LV EDV:       275 ml LV vol d, MOD A4C: 212.0 ml LV ESV:       187 ml LV vol s, MOD A2C: 140.0 ml LV SV:        87 ml LV vol s, MOD A4C: 146.0 ml LV SV MOD A2C:     84.0 ml LV SV MOD A4C:     212.0 ml LV SV MOD BP:      83.4 ml  RIGHT VENTRICLE             IVC RV S prime:     14.40 cm/s  IVC diam: 1.50 cm TAPSE (M-mode): 2.1 cm  LEFT ATRIUM              Index        RIGHT ATRIUM           Index LA diam:        5.00 cm  2.58 cm/m   RA Area:     11.00 cm LA Vol (A2C):   109.0 ml 56.21 ml/m  RA Volume:   20.00 ml  10.31 ml/m LA Vol (A4C):   65.0 ml  33.52 ml/m LA Biplane Vol: 90.7 ml  46.78 ml/m AORTIC VALVE AV Area (Vmax):    2.18 cm AV Area (Vmean):   2.08 cm AV Area (VTI):     2.35 cm AV Vmax:           151.00 cm/s AV Vmean:          108.000 cm/s AV VTI:            0.395 m AV Peak Grad:      9.1 mmHg AV Mean Grad:      5.0 mmHg LVOT Vmax:         79.10 cm/s LVOT Vmean:        54.000 cm/s LVOT VTI:          0.223 m LVOT/AV VTI ratio: 0.56 AI PHT:            639 msec  AORTA Ao Root diam: 3.90 cm Ao Asc diam:  3.10 cm  MITRAL VALVE                  TRICUSPID VALVE MV Area (PHT): 3.08 cm       TR Peak grad:   29.8 mmHg MV Decel Time: 246 msec       TR Vmax:         273.00 cm/s MR Peak grad:    89.9 mmHg MR Mean grad:    54.0 mmHg    SHUNTS MR Vmax:         474.00 cm/s  Systemic VTI:  0.22 m MR Vmean:        340.0 cm/s   Systemic Diam: 2.30 cm MR PISA:         1.57 cm MR PISA Eff ROA: 13 mm MR PISA Radius:  0.50 cm MV E velocity: 101.00 cm/s MV A velocity: 108.00 cm/s MV E/A ratio:  0.94  Olga Millers MD Electronically signed by Olga Millers MD Signature Date/Time: 08/25/2022/12:17:03 PM    Final          ______________________________________________________________________________________________      Risk Assessment/Calculations:  CHA2DS2-VASc Score = 5   This indicates a 7.2% annual risk of stroke. The patient's score is based upon: CHF History: 1 HTN History: 1 Diabetes History: 1 Stroke History: 0 Vascular Disease History: 1 Age Score: 1 Gender Score: 0            Physical Exam:   VS:  BP 138/60 (BP Location: Left Arm, Patient Position: Sitting, Cuff Size: Normal)   Pulse 68   Ht 5\' 6"  (1.676 m)   Wt 187 lb (84.8 kg)   SpO2 98%   BMI 30.18 kg/m    Wt Readings from Last 3 Encounters:  11/27/23 187 lb (84.8 kg)  11/20/23 194 lb 14.2 oz (88.4 kg)  11/16/23 191 lb 12.8 oz (87 kg)    GEN: Well nourished, well developed in no acute distress. Sitting comfortably in the chair  NECK: No JVD  CARDIAC: RRR, no murmurs, rubs, gallops. Radial pulses 2+ bilaterally  RESPIRATORY:  Clear to auscultation without rales, wheezing or rhonchi. Normal WOB on room air   ABDOMEN: Soft, non-tender, non-distended EXTREMITIES:  No edema in BLE. No deformity   ASSESSMENT AND PLAN: .    Chronic Systolic Heart Failure LBBB S/p ICD  - Echocardiogram from 08/2022 showed EF 35-40% with regional wall motion abnormalities, grade I DD, normal RV function - Optivol Thoracic impedance suggesting chronic possible fluid accumulation starting 06/2023.  Fluid index greater than normal threshold starting 10/24  - Patient has chronic  LBBB. Possible that this is contributing to his CHF  - May benefit from bi-ventricular pacing. Discussed having him see Dr. Elberta Fortis to discuss device upgrade. Patient is interested and willing to discuss further. Arranged F/U with Dr. Elberta Fortis  - Ordered repeat echocardiogram to reassess EF - Patient denies orthopnea, shortness of breath, abdominal distention. Has mild lower extremity swelling at nights which usually improves by morning - Continue lasix 60 mg daily. Considered increasing dose given evidence of fluid accumulation on his device check. However, he only has mild symptoms. With his CKD, continue current lasix dose for now  - Continue carvedilol 12.5 mg BID, spironolactone 12.5 mg daily.  - Valsartan was held at discharge due to low BP. BP has improved (138/60) and patient denies dizziness - Resume valsartan at 40 mg daily  - K 3.6, creatinine stable when PCP checked this week. Baseline creatinine 1.6-1.8  - Ordered BMP to be completed in 2 weeks after resuming valsartan   PAF  History of VT  - Patient previously had a VT arrest in 08/2016, had a single chamber ICD implanted in 09/2016. Later admitted in 07/2022 following an ICD shock. Device interrogation later showed atrial fibrillation and patient was started on eliquis  - Recently admitted in 10/2023 with a GI bleed following colonoscopy and polypectomy. Hemoglobin was down to 6.7 on 2/22. Improved to 8.8 on 2/25 - Patient has had elevated HR since GI bleed. He denies palpitations, HR now in the 60s in clinic  - PCP checked CBC earlier this week. Hemoglobin stable. Continue eliquis 5 mg BID. Patient denies bleeding since he was discharged  - Continue amiodarone 200 mg daily. TSH WNL in 06/2023. LFTs normal in 10/2023 per my records. Reportedly, when his PCP checked labs his LFTs were elevated. Unable to review these labs myself. Ordered LFTs for monitoring  - Continue carvedilol 12.5 mg BID  - As above, considering device upgrade  CAD   - LHC in 2017 showed single vessel occlusive CAD with 90% ostial stenosis in the  RCA and 100% stenosis in the mid-distal RCA. There was collateral flow and patient was managed medically  - Patient denies chest pain  - Not on ASA due to eliquis  - Continue carvedilol 12.5 mg BID - PCP held lipitor following reportedly abnormal LFTs. Ordered LFTs to reassess   HTN - BP a bit elevated today, 138/60 - Resume valsartan at 40 mg daily as above. Ordered BMP in 2 weeks for monitoring  - Continue carvedilol 12.5 mg BID, spironolactone 12.5 mg daily   HLD  - Ordered LFTs, lipid panel  - PCP held lipitor due to elevated LFTs. Consider resuming pending labs   CKD stage IIIb  - Baseline creatinine 1.6-1.8 - BMP in 2 weeks after staring valsartan back  - Followed by nephrology   Dispo: Follow up in 4 weeks with APP   Signed, Jonita Albee, PA-C

## 2023-11-27 ENCOUNTER — Encounter: Payer: Self-pay | Admitting: Cardiology

## 2023-11-27 ENCOUNTER — Ambulatory Visit: Payer: No Typology Code available for payment source | Attending: Cardiology | Admitting: Cardiology

## 2023-11-27 VITALS — BP 138/60 | HR 68 | Ht 66.0 in | Wt 187.0 lb

## 2023-11-27 DIAGNOSIS — I10 Essential (primary) hypertension: Secondary | ICD-10-CM

## 2023-11-27 DIAGNOSIS — I5022 Chronic systolic (congestive) heart failure: Secondary | ICD-10-CM | POA: Diagnosis not present

## 2023-11-27 DIAGNOSIS — I255 Ischemic cardiomyopathy: Secondary | ICD-10-CM | POA: Diagnosis not present

## 2023-11-27 DIAGNOSIS — I472 Ventricular tachycardia, unspecified: Secondary | ICD-10-CM

## 2023-11-27 DIAGNOSIS — E785 Hyperlipidemia, unspecified: Secondary | ICD-10-CM

## 2023-11-27 DIAGNOSIS — I480 Paroxysmal atrial fibrillation: Secondary | ICD-10-CM

## 2023-11-27 DIAGNOSIS — Z95810 Presence of automatic (implantable) cardiac defibrillator: Secondary | ICD-10-CM

## 2023-11-27 DIAGNOSIS — I2510 Atherosclerotic heart disease of native coronary artery without angina pectoris: Secondary | ICD-10-CM

## 2023-11-27 DIAGNOSIS — N1832 Chronic kidney disease, stage 3b: Secondary | ICD-10-CM

## 2023-11-27 MED ORDER — VALSARTAN 40 MG PO TABS: 40.0000 mg | ORAL_TABLET | Freq: Every day | ORAL | 3 refills | Status: DC

## 2023-11-27 NOTE — Patient Instructions (Addendum)
 Medication Instructions:  Decrease Valsartan to 40 mg once a day *If you need a refill on your cardiac medications before your next appointment, please call your pharmacy*  Lab Work: Today we are going to draw Lipid panel and LFT's. In 2 weeks we are going to draw a Bmet If you have labs (blood work) drawn today and your tests are completely normal, you will receive your results only by: MyChart Message (if you have MyChart) OR A paper copy in the mail If you have any lab test that is abnormal or we need to change your treatment, we will call you to review the results.  Testing/Procedures: Your physician has requested that you have an echocardiogram. Echocardiography is a painless test that uses sound waves to create images of your heart. It provides your doctor with information about the size and shape of your heart and how well your heart's chambers and valves are working. This procedure takes approximately one hour. There are no restrictions for this procedure. Please do NOT wear cologne, perfume, aftershave, or lotions (deodorant is allowed). Please arrive 15 minutes prior to your appointment time.  Please note: We ask at that you not bring children with you during ultrasound (echo/ vascular) testing. Due to room size and safety concerns, children are not allowed in the ultrasound rooms during exams. Our front office staff cannot provide observation of children in our lobby area while testing is being conducted. An adult accompanying a patient to their appointment will only be allowed in the ultrasound room at the discretion of the ultrasound technician under special circumstances. We apologize for any inconvenience.  Follow-Up: At [facility], you and your health needs are our priority.  As part of our continuing mission to provide you with exceptional heart care, we have created designated Provider Care Teams.  These Care Teams include your primary Cardiologist (physician) and  Advanced Practice Providers (APPs -  Physician Assistants and Nurse Practitioners) who all work together to provide you with the care you need, when you need it.  We recommend signing up for the patient portal called "MyChart".  Sign up information is provided on this After Visit Summary.  MyChart is used to connect with patients for Virtual Visits (Telemedicine).  Patients are able to view lab/test results, encounter notes, upcoming appointments, etc.  Non-urgent messages can be sent to your provider as well.   To learn more about what you can do with MyChart, go to [email or web address].    Your next appointment:   4 week(s)  Provider:   You may see Will Jorja Loa, MD For general Cardiology we will have you follow up with Any APP in 4 weeks

## 2023-11-28 LAB — LIPID PANEL
Chol/HDL Ratio: 2.3 ratio (ref 0.0–5.0)
Cholesterol, Total: 68 mg/dL — ABNORMAL LOW (ref 100–199)
HDL: 29 mg/dL — ABNORMAL LOW (ref >39)
LDL Chol Calc (NIH): 24 mg/dL (ref 0–99)
Triglycerides: 62 mg/dL (ref 0–149)
VLDL Cholesterol Cal: 15 mg/dL (ref 5–40)

## 2023-11-28 LAB — HEPATIC FUNCTION PANEL
ALT: 193 IU/L — ABNORMAL HIGH (ref 0–44)
AST: 34 IU/L (ref 0–40)
Albumin: 4 g/dL (ref 3.8–4.8)
Alkaline Phosphatase: 99 IU/L (ref 44–121)
Bilirubin Total: 1 mg/dL (ref 0.0–1.2)
Bilirubin, Direct: 0.52 mg/dL — ABNORMAL HIGH (ref 0.00–0.40)
Total Protein: 6.7 g/dL (ref 6.0–8.5)

## 2023-12-05 ENCOUNTER — Telehealth: Payer: Self-pay

## 2023-12-05 DIAGNOSIS — I5022 Chronic systolic (congestive) heart failure: Secondary | ICD-10-CM

## 2023-12-05 DIAGNOSIS — I480 Paroxysmal atrial fibrillation: Secondary | ICD-10-CM

## 2023-12-05 NOTE — Telephone Encounter (Signed)
 Called patient advised of below they verbalized understanding.

## 2023-12-05 NOTE — Telephone Encounter (Signed)
-----   Message from Dean [name] sent at 11/30/2023 11:46 AM EST ----- Please tell patient that his ALT (marker of liver dysfunction) is elevated. Agree with his primary care providers recommendation to stop his lipitor.   Thanks [employer]

## 2023-12-06 NOTE — Progress Notes (Signed)
 No ICM remote transmission received for 12/03/2023 and next ICM transmission scheduled for 12/24/2023.

## 2023-12-07 ENCOUNTER — Ambulatory Visit: Payer: Self-pay

## 2023-12-11 ENCOUNTER — Ambulatory Visit (HOSPITAL_COMMUNITY): Attending: Cardiology

## 2023-12-11 ENCOUNTER — Encounter (HOSPITAL_COMMUNITY): Payer: Self-pay | Admitting: Cardiology

## 2023-12-11 ENCOUNTER — Telehealth: Payer: Self-pay

## 2023-12-11 DIAGNOSIS — I5022 Chronic systolic (congestive) heart failure: Secondary | ICD-10-CM

## 2023-12-11 DIAGNOSIS — Z79899 Other long term (current) drug therapy: Secondary | ICD-10-CM

## 2023-12-11 DIAGNOSIS — I480 Paroxysmal atrial fibrillation: Secondary | ICD-10-CM

## 2023-12-11 LAB — BASIC METABOLIC PANEL
BUN/Creatinine Ratio: 24 (ref 10–24)
BUN: 52 mg/dL — ABNORMAL HIGH (ref 8–27)
CO2: 22 mmol/L (ref 20–29)
Calcium: 10.3 mg/dL — ABNORMAL HIGH (ref 8.6–10.2)
Chloride: 97 mmol/L (ref 96–106)
Creatinine, Ser: 2.18 mg/dL — ABNORMAL HIGH (ref 0.76–1.27)
Glucose: 87 mg/dL (ref 70–99)
Potassium: 4.7 mmol/L (ref 3.5–5.2)
Sodium: 134 mmol/L (ref 134–144)
eGFR: 31 mL/min/{1.73_m2} — ABNORMAL LOW (ref >59)

## 2023-12-11 NOTE — Telephone Encounter (Signed)
-----   Message from Jonita Albee sent at 12/11/2023  1:28 PM EDT ----- Please tell patient that his lab work shows elevated creatinine up to 2.18 and BUN elevated to 52. This suggests he may be a bit dehydrated. He should decrease his lasix from 60 mg daily to 40 mg daily. Please repeat BMP in 2 weeks (at the time of LFTs)

## 2023-12-11 NOTE — Addendum Note (Signed)
 Addended by: Clotilde Dieter on: 12/11/2023 07:55 AM   Modules accepted: Orders

## 2023-12-11 NOTE — Telephone Encounter (Signed)
Patient mailbox full

## 2023-12-12 ENCOUNTER — Telehealth: Payer: Self-pay | Admitting: Cardiology

## 2023-12-12 MED ORDER — FUROSEMIDE 40 MG PO TABS: 40.0000 mg | ORAL_TABLET | Freq: Every day | ORAL | 1 refills | Status: DC

## 2023-12-12 NOTE — Telephone Encounter (Signed)
 Patient identification verified by 2 forms. Marilynn Rail, RN    Called and spoke to patient  Relayed provider message below  Reviewed Rx instruction/education  Patient aware Rx sent to preferred pharmacy  Patient aware to present to lab in 2 weeks  Patient verbalized understanding, no questions at this time    Lab order placed and released

## 2023-12-12 NOTE — Telephone Encounter (Signed)
 Pt c/o medication issue:  1. Name of Medication: furosemide (LASIX) 40 MG tablet   2. How are you currently taking this medication (dosage and times per day)? As directed  3. Are you having a reaction (difficulty breathing--STAT)? no  4. What is your medication issue? Patient just spoke with a nurse regarding his lab results but he has concerns about decreasing his lasix from 60mg  to 40mg  and would like to discuss with a nurse.

## 2023-12-12 NOTE — Telephone Encounter (Signed)
 Patient is returning phone call.

## 2023-12-12 NOTE — Telephone Encounter (Signed)
 Patient identification verified by 2 forms. Marilynn Rail, RN    Called and spoke to patient  Informed patient:   -lasix decreased to 40mg  due to increasing kidney function   -plan is to follow up with labs in 2 weeks   -if develops swelling and weight gain, contact office   -monitor daily weights  Patient verbalized understanding, no questions at this time

## 2023-12-18 ENCOUNTER — Other Ambulatory Visit: Payer: Self-pay | Admitting: Cardiology

## 2023-12-19 NOTE — Addendum Note (Signed)
 Addended by: Elease Etienne A on: 12/19/2023 09:55 AM   Modules accepted: Orders

## 2023-12-19 NOTE — Progress Notes (Signed)
 Remote ICD transmission.

## 2023-12-24 ENCOUNTER — Ambulatory Visit: Attending: Cardiology

## 2023-12-24 DIAGNOSIS — Z95810 Presence of automatic (implantable) cardiac defibrillator: Secondary | ICD-10-CM | POA: Diagnosis not present

## 2023-12-24 DIAGNOSIS — I5022 Chronic systolic (congestive) heart failure: Secondary | ICD-10-CM

## 2023-12-25 NOTE — Progress Notes (Signed)
 EPIC Encounter for ICM Monitoring  Patient Name: Dean Dethloff Sr. is a 73 y.o. male Date: 12/25/2023 Primary Care Physican: Ralene Ok, MD Primary Cardiologist: [facility] Electrophysiologist: Elberta Fortis Nephrologist:  [facility] 01/31/2023 Weight:   183 lbs 06/21/2023 Weight: 183 lbs 07/24/2023 Weight: 183-184 lbs 11/20/2023 Hospital Discharge Weight: 190 lbs 12/25/2023 Weight: 182 lbs   Clinical Status (21-Nov-2023 to 24-Dec-2023) Time in AF   0.0 hr/day (0.0%)(taking Eliquis)   Spoke with patient and heart failure questions reviewed.  Transmission results reviewed.  Pt reports he is feeling well and does not have any fluid symptoms.   Diet: Typically does not limit salt intake.  Eats restaurant foods 2-3 times a week.   Optivol Thoracic impedance suggesting fluid levels normal since 3/10.     Prescribed:  Furosemide 40 mg Take 1 tablet (40 mg total) by mouth daily.  Spironolactone 25 mg take 0.5 tablet (12.5 mg total) by mouth daily   Labs: 12/10/2023 Creatinine 2.18, BUN 52, Potassium 4.7, Sodium 134, GFR 31  11/19/2023 Creatinine 1.64, BUN 27, Potassium 3.6, Sodium 136  11/18/2023 Creatinine 1.81, BUN 35, Potassium 4.0, Sodium 142  11/17/2023 Creatinine 1.70, BUN 30, Potassium 3.1, Sodium 135 A complete set of results can be found in Results Review.   Recommendations:   No changes and encouraged to call if experiencing any fluid symptoms.   Follow-up plan: ICM clinic phone appointment on 01/28/2024.   91 day device clinic remote transmission 02/14/2024.     EP/Cardiology Office Visits:   01/17/2024 with Robet Leu, PA.  02/06/2024 with Dr Rennis Golden.  01/03/2024 with Canary Brim, NP.   Copy of ICM check sent to Dr. Elberta Fortis   3 month ICM trend: 12/24/2023.    12-14 Month ICM trend:     Karie Soda, RN 12/25/2023 3:56 PM

## 2024-01-01 ENCOUNTER — Ambulatory Visit (HOSPITAL_COMMUNITY): Attending: Cardiology

## 2024-01-01 DIAGNOSIS — I255 Ischemic cardiomyopathy: Secondary | ICD-10-CM | POA: Diagnosis not present

## 2024-01-01 DIAGNOSIS — I480 Paroxysmal atrial fibrillation: Secondary | ICD-10-CM | POA: Diagnosis not present

## 2024-01-01 DIAGNOSIS — I2510 Atherosclerotic heart disease of native coronary artery without angina pectoris: Secondary | ICD-10-CM | POA: Diagnosis present

## 2024-01-01 DIAGNOSIS — I5022 Chronic systolic (congestive) heart failure: Secondary | ICD-10-CM

## 2024-01-01 LAB — ECHOCARDIOGRAM COMPLETE
Area-P 1/2: 4.36 cm2
P 1/2 time: 365 ms
S' Lateral: 5.5 cm

## 2024-01-03 ENCOUNTER — Encounter: Payer: Self-pay | Admitting: Pulmonary Disease

## 2024-01-03 ENCOUNTER — Ambulatory Visit: Attending: Pulmonary Disease | Admitting: Pulmonary Disease

## 2024-01-03 VITALS — BP 156/64 | HR 71 | Ht 66.0 in | Wt 188.0 lb

## 2024-01-03 DIAGNOSIS — I472 Ventricular tachycardia, unspecified: Secondary | ICD-10-CM | POA: Diagnosis not present

## 2024-01-03 DIAGNOSIS — Z79899 Other long term (current) drug therapy: Secondary | ICD-10-CM

## 2024-01-03 DIAGNOSIS — Z95810 Presence of automatic (implantable) cardiac defibrillator: Secondary | ICD-10-CM

## 2024-01-03 DIAGNOSIS — I480 Paroxysmal atrial fibrillation: Secondary | ICD-10-CM

## 2024-01-03 DIAGNOSIS — I255 Ischemic cardiomyopathy: Secondary | ICD-10-CM

## 2024-01-03 DIAGNOSIS — I5022 Chronic systolic (congestive) heart failure: Secondary | ICD-10-CM | POA: Diagnosis not present

## 2024-01-03 DIAGNOSIS — I10 Essential (primary) hypertension: Secondary | ICD-10-CM

## 2024-01-03 LAB — CUP PACEART INCLINIC DEVICE CHECK
Battery Remaining Longevity: 37 mo
Battery Voltage: 2.96 V
Brady Statistic RV Percent Paced: 0.02 %
Date Time Interrogation Session: 20250410171223
HighPow Impedance: 57 Ohm
Implantable Lead Connection Status: 753985
Implantable Lead Implant Date: 20180116
Implantable Lead Location: 753860
Implantable Lead Model: 6935
Implantable Pulse Generator Implant Date: 20180116
Lead Channel Impedance Value: 266 Ohm
Lead Channel Impedance Value: 342 Ohm
Lead Channel Pacing Threshold Amplitude: 0.875 V
Lead Channel Pacing Threshold Pulse Width: 0.4 ms
Lead Channel Sensing Intrinsic Amplitude: 11.25 mV
Lead Channel Sensing Intrinsic Amplitude: 9.5 mV
Lead Channel Setting Pacing Amplitude: 2.5 V
Lead Channel Setting Pacing Pulse Width: 0.4 ms
Lead Channel Setting Sensing Sensitivity: 0.3 mV
Zone Setting Status: 755011
Zone Setting Status: 755011

## 2024-01-03 NOTE — Patient Instructions (Signed)
 Medication Instructions:  Your physician recommends that you continue on your current medications as directed. Please refer to the Current Medication list given to you today.  *If you need a refill on your cardiac medications before your next appointment, please call your pharmacy*  Lab Work: CMET, TSH, FreeT4-TODAY If you have labs (blood work) drawn today and your tests are completely normal, you will receive your results only by: MyChart Message (if you have MyChart) OR A paper copy in the mail If you have any lab test that is abnormal or we need to change your treatment, we will call you to review the results.  Follow-Up: At [facility], you and your health needs are our priority.  As part of our continuing mission to provide you with exceptional heart care, our providers are all part of one team.  This team includes your primary Cardiologist (physician) and Advanced Practice Providers or APPs (Physician Assistants and Nurse Practitioners) who all work together to provide you with the care you need, when you need it.  Your next appointment:   6 month(s)  Provider:   Loman Brooklyn, MD      1st Floor: - Lobby - Registration  - Pharmacy  - Lab - Cafe  2nd Floor: - PV Lab - Diagnostic Testing (echo, CT, nuclear med)  3rd Floor: - Vacant  4th Floor: - TCTS (cardiothoracic surgery) - AFib Clinic - Structural Heart Clinic - Vascular Surgery  - Vascular Ultrasound  5th Floor: - HeartCare Cardiology (general and EP) - Clinical Pharmacy for coumadin, hypertension, lipid, weight-loss medications, and med management appointments    Valet parking services will be available as well.

## 2024-01-03 NOTE — Progress Notes (Signed)
 Electrophysiology Office Note:   Date:  01/03/2024  ID:  Dean Jasper Sr., DOB [birth year], MRN [id number]  Primary Cardiologist: Chrystie Nose, MD Primary Heart Failure: None Electrophysiologist: Will Jorja Loa, MD       History of Present Illness:   Dean Gavia Sr. is a 73 y.o. male with h/o VT/VF arrest s/p ICD, HFrEF, LBBB, CAD, HTN, HLD, AF, DM, CKD III, OSA on CPAP seen today for routine electrophysiology followup.   He was hospitalized from 2/22 - 11/20/23 in the setting of hematochezia post polyp removal.  Hospital course was complicated by acute on chronic anemia.  Since last being seen in our clinic the patient reports he is recovering well.  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  He asks about his echocardiogram that was recently done with cardiology.  He denies chest pain, palpitations, dyspnea, PND, orthopnea, nausea, vomiting, dizziness, syncope, edema, weight gain, or early satiety.   Review of systems complete and found to be negative unless listed in HPI.   EP Information / Studies Reviewed:    EKG is ordered today. Personal review as below.  EKG Interpretation Date/Time:  Thursday January 03 2024 15:06:39 EDT Ventricular Rate:  71 PR Interval:  260 QRS Duration:  154 QT Interval:  456 QTC Calculation: 495 R Axis:   132  Text Interpretation: Sinus rhythm with 1st degree A-V block Right axis deviation Confirmed by Dean [name] ([id number]) on 01/03/2024 3:54:05 PM   ICD Interrogation-  reviewed in detail today,  See PACEART report.  Device History: Medtronic Single Chamber ICD implanted 10/10/2016 for ICM History of appropriate therapy: Yes, 07/2022 in setting of missed amiodarone  History of AAD therapy: Yes; currently on amiodarone    Studies:  LHC 2017> moderate LV systolic dysfunction, LVEDP normal, LVEF 35-45%, Ost RCA lesion 90% stenosed, Mid RCA to Dist RCA lesion 100% stenosed,  ECHO 12/2023 > LVEF 30-35%, GIIDD, LA severely  dilated, RA mildly dilated  Arrhythmia / AAD VT / VF Amiodarone > initiated 08/2016    Risk Assessment/Calculations:    CHA2DS2-VASc Score = 5   This indicates a 7.2% annual risk of stroke. The patient's score is based upon: CHF History: 1 HTN History: 1 Diabetes History: 1 Stroke History: 0 Vascular Disease History: 1 Age Score: 1 Gender Score: 0          Physical Exam:   VS:  BP (!) 156/64   Pulse 71   Ht 5\' 6"  (1.676 m)   Wt 188 lb (85.3 kg)   SpO2 97%   BMI 30.34 kg/m    Wt Readings from Last 3 Encounters:  01/03/24 188 lb (85.3 kg)  11/27/23 187 lb (84.8 kg)  11/20/23 194 lb 14.2 oz (88.4 kg)     GEN: Well nourished, well developed in no acute distress NECK: No JVD; No carotid bruits CARDIAC: Regular rate and rhythm, holosystolic murmurs, no rubs, gallops RESPIRATORY:  Clear to auscultation without rales, wheezing or rhonchi  ABDOMEN: Soft, non-tender, non-distended EXTREMITIES:  No edema; No deformity   ASSESSMENT AND PLAN:    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  OptiVol reflects recent fluid accumulation but is headed back toward baseline. -Stable on an appropriate medical regimen -Normal ICD function -See Pace Art report -No changes today -EKG with NSR, stable intervals   -Assess amio labs > CMP, TSH, free  T4  CAD -no anginal symptoms   Paroxysmal AF  CHA2DS2-VASc 5 -<0/1% burden on device   Secondary Hypercoagulable State  -continue Eliquis 5mg  BID, dose reviewed and appropriate by age / wt   Hypertension  -Mildly elevated in clinic, patient to monitor  Disposition:   Follow up with Dr. Elberta Fortis or EP APP in 6 months   Signed, Dean Brim, NP-C, AGACNP-BC [facility] - Electrophysiology  01/03/2024, 3:54 PM

## 2024-01-07 ENCOUNTER — Telehealth: Payer: Self-pay

## 2024-01-07 NOTE — Telephone Encounter (Signed)
-----   Message from Debria Fang sent at 01/04/2024 10:39 AM EDT ----- Please tell patient that his echocardiogram showed EF (pumping function of the heart) a bit low at 30-35%. It was previously 35-40%, so overall heart strength has been stable. The heart does not relax fully. RV is functioning normally, but is a bit enlarged. There is severely elevated pulmonary artery systolic pressure, which can contribute to shortness of breath. There is now severe mitral regurgitation (leaking). Also now moderate-severe tricuspid valve regurgitation (leaking). We will discuss these results further at his appointment later this month.

## 2024-01-07 NOTE — Telephone Encounter (Signed)
 Called patient advised of below they verbalized understanding Is Aware of upcoming appointment

## 2024-01-07 NOTE — Progress Notes (Signed)
 Cardiology Office Note:  .   Date:  01/17/2024  ID:  Dean Bode Sr., DOB Nov 27, 1950, MRN [id number] PCP: Edda Goo, MD  [facility] Providers Cardiologist:  Hazle Lites, MD Cardiology APP:  Tania Familia, NP  Electrophysiologist:  Lei Pump, MD   History of Present Illness: .   Dean Betker Sr. is a 73 y.o. male with a past medical history of chronic systolic heart failure/ischemic cardiomyopathy, LBBB, history of VT s/p medtronic single-lead ICD in 2018, CAD, HTN, HLD, type 2 DM, PAF, OSA on CPAP.    Patient had a VT arrest in 08/2016 that required CPR and defibrillation. Echocardiogram 08/28/16 showed EF 35-40%, severe LAE, mild AI, mild MR. Cardiac catheterization on 09/07/16 demonstrated sing-vessel CAD with 90% ostial RCA disease, 100% mid-distal RCA occlusion with L to R collaterals. Recommended medical management. Underwent single-lead Medtronic ICD implantation in 09/2016. Repeat echocardiograms in 04/2018, 03/2019, and 04/2020 continued to showed EF of 30-35%. ICD interrogation has since revealed paroxysmal atrial fibrillation and patient was started on eliquis .    Patient later admitted in 07/2022 following an ICD shock. Echocardiogram 08/25/22 showed EF 35-40%, akinesis of the inferolateral wall, grade I DD, mildly elevated PASP, moderate LAE, moderate MR, moderate AI. Device interrogation revealed VT/V-fib with maximal heart rate of 231 BPM. He had 2 aborted discharges followed by 1 successful discharge. Hospital course was complicated by AKI on CKD, low magnesium . He had been out of his amiodarone  for one week, and he was reloaded with IV amiodarone .    Patient was seen by gen cards in follow up on 11/27/22. At that time, patient's BP was elevated and he was restarted on entresto . He was euvolemic on exam and device function was stable.    He was last seen by EP on 07/19/23. At that time, patient had been feeling well. He did not have anginal symptoms.  Remained on BB, statin therapy, entrestro, spiro, lasix , eliquis .    Patient recently admitted from 2/22-2/25. He underwent colonoscopy with polypectomy by hot snare with clips on 2/22. He went home and had tool his anticoagulation, then had a large amount of bright red blood in the toilet. In the ED, hemoglobin 6.7. He was transfused 1 unit and admitted. He underwent repeat colonoscopy that showed adherent clot to the recent polypectomy site, after clot removal there was active bleeding, so repeat clip and hemostatic gel were placed. His ARB was held due to low BP. Eliquis  was held for 3 days post discharge.    He called the office after being discharged with some feelings of elevated HR. Device check did show some elevated HR, likely related to his GI bleed. "Optivol Thoracic impedance suggesting chronic possible fluid accumulation starting 06/2023.  Fluid index greater than normal threshold starting 10/24 "   I saw patient in clinic on 11/27/23. At that time, patient reported feeling weak and fatigued. He did not have any further issues with GI bleeding that he could tell. Was tolerating eliquis  well. It was felt that patient would possibly benefit from Bi-v pacing, and I referred him to EP to discuss a device upgrade. ALT was elevated, so his lipitor was held. Valsartan  was resumed at 40 mg daily. Lasix  was decreased to 40 mg daily due to elevated creatinine.   He was seen by EP on 4/10. At that time, his ALT had returned to normal. Creatinine stable at 1.71.   Echocardiogram on 01/01/24 showed EF 30-35% with regional wall motion abnormalities, grade  II DD, normal RV systolic function, severely elevated PA systolic pressure, severe MR (ischemic MR), moderate-severe TR, mild-moderate AI   Patient called the office on 4/16 complaining of shortness of breath that had been going on for 1 month. Symptoms were somewhat vague. His oxygen saturations were between 97-99% at home. He denied lower extremity  swelling or weight gain. Patient again called the clinic on 4/21 complaining of bilateral lower extremity swelling. His lasix  was increased to 80 mg daily.   Today, patent presents for a follow up appointment. Reports that his main concern is his lower extremity swelling. He has swelling in both of his feet, but it is more noticeable on the left. He denies shortness of breath, abdominal distention, orthopnea. Denies cough, orthopnea. Denies dizziness, syncope, near syncope. He has been compliant with his eliquis  and denies bleeding. Denies weight gain. A few days ago, his lasix  dose was increased to 80 mg daily. Despite this increase, he reports that his feet still appear puffy, although he feels generally well. He has been urinating regularly, particularly at night, and has noticed some improvement in the swelling  ROS: Per HPI   Studies Reviewed: .   Cardiac Studies & Procedures   ______________________________________________________________________________________________ CARDIAC CATHETERIZATION  CARDIAC CATHETERIZATION 09/07/2016  Conclusion  There is moderate left ventricular systolic dysfunction.  LV end diastolic pressure is normal.  The left ventricular ejection fraction is 35-45% by visual estimate.  Ost RCA lesion, 90 %stenosed.  Mid RCA to Dist RCA lesion, 100 %stenosed.  1. Single vessel occlusive CAD. The distal RCA is occluded with left to right collaterals. There is a 90% ostial stenosis 2. Moderate LV dysfunction with EF 35-40%. Akinesis of the basal to mid inferior wall 3. Normal LVEDP  Plan: medical therapy. If he has significant angina we could consider PCI of the RCA but there appears to be significant scar of the inferior wall.  Findings Coronary Findings Diagnostic  Dominance: Right  Right Coronary Artery  Right Posterior Descending Artery Collaterals RPDA filled by collaterals from 2nd Sept.  Intervention  No interventions have been documented.      ECHOCARDIOGRAM  ECHOCARDIOGRAM COMPLETE 01/01/2024  Narrative ECHOCARDIOGRAM REPORT    Patient Name:   Dean Leon Sr. Date of Exam: 01/01/2024 Medical Rec #:  [id number]            Height:       66.0 in Accession #:    [id number]           Weight:       187.0 lb Date of Birth:  [birth year]             BSA:          1.943 m Patient Age:    73 years             BP:           138/60 mmHg Patient Gender: M                    HR:           68 bpm. Exam Location:  [address]  Procedure: 2D Echo, Cardiac Doppler and Color Doppler (Both Spectral and Color Flow Doppler were utilized during procedure).  Indications:    I48.0 Paroxysmal atrial fibrillation  History:        Patient has prior history of Echocardiogram examinations, most recent 08/25/2022. CHF and Ischemic cardiomyopathy, CAD, Defibrillator, Arrythmias:Paroxysmal atrial fibrillation and LBBB, Risk Factors:Hypertension and Dyslipidemia.;  Medications:Ventricular tachycardia.  Sonographer:    Lula Sale RDCS Referring Phys: [id number] Debria Fang  IMPRESSIONS   1. Left ventricular ejection fraction, by estimation, is 30 to 35%. The left ventricle has moderately decreased function. The left ventricle demonstrates regional wall motion abnormalities (see scoring diagram/findings for description). The left ventricular internal cavity size was moderately dilated. There is mild concentric left ventricular hypertrophy. Left ventricular diastolic parameters are consistent with Grade II diastolic dysfunction (pseudonormalization). 2. Right ventricular systolic function is normal. The right ventricular size is moderately enlarged. There is severely elevated pulmonary artery systolic pressure. The estimated right ventricular systolic pressure is 69.3 mmHg. 3. Left atrial size was severely dilated. 4. Right atrial size was mildly dilated. 5. 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 The mitral valve is  degenerative. Severe mitral valve regurgitation. 6. Tricuspid valve regurgitation is moderate to severe. 7. The aortic valve is tricuspid. There is mild calcification of the aortic valve. There is mild thickening of the aortic valve. Aortic valve regurgitation is mild to moderate. Aortic valve sclerosis/calcification is present, without any evidence of aortic stenosis. 8. The inferior vena cava is normal in size with greater than 50% respiratory variability, suggesting right atrial pressure of 3 mmHg.  Comparison(s): Changes from prior study are noted. LVEF now 30-35%. Severe MR. Moderate to severe TR. Severely elevated pulmonary pressures.  FINDINGS Left Ventricle: Left ventricular ejection fraction, by estimation, is 30 to 35%. The left ventricle has moderately decreased function. The left ventricle demonstrates regional wall motion abnormalities. The left ventricular internal cavity size was moderately dilated. There is mild concentric left ventricular hypertrophy. Left ventricular diastolic parameters are consistent with Grade II diastolic dysfunction (pseudonormalization).   LV Wall Scoring: The posterior wall and basal inferior segment are akinetic.  Right Ventricle: The right ventricular size is moderately enlarged. No increase in right ventricular wall thickness. Right ventricular systolic function is normal. There is severely elevated pulmonary artery systolic pressure. The tricuspid regurgitant velocity is 4.07 m/s, and with an assumed right atrial pressure of 3 mmHg, the estimated right ventricular systolic pressure is 69.3 mmHg.  Left Atrium: Left atrial size was severely dilated.  Right Atrium: Right atrial size was mildly dilated.  Pericardium: There is no evidence of pericardial effusion.  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 The mitral valve is degenerative in appearance. Severe mitral valve regurgitation.  Tricuspid Valve: The tricuspid  valve is grossly normal. Tricuspid valve regurgitation is moderate to severe.  Aortic Valve: The aortic valve is tricuspid. There is mild calcification of the aortic valve. There is mild thickening of the aortic valve. Aortic valve regurgitation is mild to moderate. Aortic regurgitation PHT measures 365 msec. Aortic valve sclerosis/calcification is present, without any evidence of aortic stenosis.  Pulmonic Valve: The pulmonic valve was grossly normal. Pulmonic valve regurgitation is mild. No evidence of pulmonic stenosis.  Aorta: The aortic root and ascending aorta are structurally normal, with no evidence of dilitation.  Venous: The inferior vena cava is normal in size with greater than 50% respiratory variability, suggesting right atrial pressure of 3 mmHg.  IAS/Shunts: The atrial septum is grossly normal.  Additional Comments: A device lead is visualized in the right atrium and right ventricle.   LEFT VENTRICLE PLAX 2D LVIDd:         6.40 cm   Diastology LVIDs:         5.50 cm   LV e' medial:    4.03 cm/s  LV PW:         1.00 cm   LV E/e' medial:  36.0 LV IVS:        1.20 cm   LV e' lateral:   5.66 cm/s LVOT diam:     2.20 cm   LV E/e' lateral: 25.6 LV SV:         51 LV SV Index:   26 LVOT Area:     3.80 cm   RIGHT VENTRICLE             IVC RV S prime:     13.60 cm/s  IVC diam: 1.80 cm TAPSE (M-mode): 2.6 cm RVSP:           69.3 mmHg  LEFT ATRIUM              Index        RIGHT ATRIUM           Index LA diam:        5.75 cm  2.96 cm/m   RA Pressure: 3.00 mmHg LA Vol (A2C):   111.0 ml 57.11 ml/m  RA Area:     23.60 cm LA Vol (A4C):   86.3 ml  44.40 ml/m  RA Volume:   75.80 ml  39.00 ml/m LA Biplane Vol: 101.0 ml 51.97 ml/m AORTIC VALVE LVOT Vmax:   67.10 cm/s LVOT Vmean:  41.300 cm/s LVOT VTI:    0.133 m AI PHT:      365 msec  AORTA Ao Root diam: 3.80 cm Ao Asc diam:  3.50 cm  MITRAL VALVE                TRICUSPID VALVE MV Area (PHT): 4.36 cm     TR Peak  grad:   66.3 mmHg MV Decel Time: 174 msec     TR Vmax:        407.00 cm/s MV E velocity: 145.00 cm/s  Estimated RAP:  3.00 mmHg MV A velocity: 106.00 cm/s  RVSP:           69.3 mmHg MV E/A ratio:  1.37 SHUNTS Systemic VTI:  0.13 m Systemic Diam: 2.20 cm  Jackquelyn Mass MD Electronically signed by Jackquelyn Mass MD Signature Date/Time: 01/01/2024/6:52:37 PM    Final          ______________________________________________________________________________________________      Risk Assessment/Calculations:    CHA2DS2-VASc Score = 5   This indicates a 7.2% annual risk of stroke. The patient's score is based upon: CHF History: 1 HTN History: 1 Diabetes History: 1 Stroke History: 0 Vascular Disease History: 1 Age Score: 1 Gender Score: 0    Physical Exam:   VS:  BP (!) 130/54 (BP Location: Left Arm, Patient Position: Sitting, Cuff Size: Normal)   Pulse 65   Ht 5\' 6"  (1.676 m)   Wt 189 lb (85.7 kg)   SpO2 99%   BMI 30.51 kg/m    Wt Readings from Last 3 Encounters:  01/17/24 189 lb (85.7 kg)  01/03/24 188 lb (85.3 kg)  11/27/23 187 lb (84.8 kg)    GEN: Well nourished, well developed in no acute distress. Sitting comfortably in the chair  NECK: No JVD CARDIAC: RRR. Grade 2/6 systolic murmur  RESPIRATORY:  Crackles in bilateral lung bases. Normal WOB on room air  ABDOMEN: Soft, non-tender, non-distended EXTREMITIES:  2+ edema in BLE, more noticeable in left>right.  No deformity   ASSESSMENT AND PLAN: .    Chronic combined systolic and diastolic  Heart Failure LBBB S/p ICD  - EF has been around 30-40% since 2017. Patient has chronic LBBB. Possible that this is contributing to his CHF. May benefit from bi-ventricular pacing. He is followed by Dr. Lawana Pray with EP, was recently seen by EP on 01/03/24 and had normal ICD function at that time. OptiVol reflected recent fluid accumulation but was headed back toward baseline  - Most recent echocardiogram from 12/2023 showed EF  30-35%, grade II DD, normal RV systolic function, severely elevated PA systolic pressure, severe MR, moderate-severe TR - Patient has called the office twice in the past 2 weeks- once with shortness of breath that had been ongoing for a month, the other time with lower extremity edema. His lasix  was increased to 80 mg daily for the past 3 days, notes some improvement in symptoms. However he continues to have lower extremity swelling. He denies shortness of breath, orthopnea, abdominal distention - On exam, patient has bilateral lower extremity edema and crackles in lung bases - Ordered BNP, BMP today  - Continue lasix  80 mg daily until 4/29. 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 MR was previously moderate in 08/2022 - Considered TEE, but patient is unlikely to be a good candidate for mitraclip. I suspect his worsening MR may be related to hypervolemia  - Patient has appointment with Dr. Maximo Spar in 01/2024. Will defer further workup to him   PAF  History of VT  - Patient previously had a VT arrest in 08/2016, had a single chamber ICD implanted in 09/2016. Later admitted in 07/2022 following an ICD shock. Device interrogation later showed atrial fibrillation and patient was started on eliquis   - Recently admitted in 10/2023 with a GI bleed following colonoscopy and polypectomy. Bleeding has since resolved  - Continue eliquis  5 mg BID. Patient denies bleeding - Ordered CBC after recent GI bleed  - Continue amiodarone  200 mg daily. LFTs, TSH normal on 01/03/24  - Continue carvedilol  12.5 mg BID  - As above, considering device upgrade with EP    CAD  - LHC in 2017 showed single vessel occlusive CAD with 90% ostial  stenosis in the RCA and 100% stenosis in the mid-distal RCA. There was collateral flow and patient was managed medically  - Patient denies chest pain  - Not on ASA due to eliquis   - Continue carvedilol  12.5 mg BID - Lipitor currently held- patient had elevated ALT in 11/2023. Has since returned to normal. Plan to resume at appointment in 01/2024    HTN - BP well controlled. No symptoms of hypotension  - Continue carvedilol  12.5 mg BID, spironolactone  12.5 mg daily, valsartan  40 mg daily    HLD  - Lipid panel from 11/2023 showed LDL 24 - As above, plan to resume lipitor in 01/2024    CKD stage IIIb  - Baseline creatinine 1.6-1.8 - Followed by nephrology   Dispo: Follow up with Dr. Maximo Spar as scheduled on 02/06/24  Signed, Debria Fang, PA-C

## 2024-01-08 LAB — COMPREHENSIVE METABOLIC PANEL WITH GFR
ALT: 13 IU/L (ref 0–44)
AST: 23 IU/L (ref 0–40)
Albumin: 4.3 g/dL (ref 3.8–4.8)
Alkaline Phosphatase: 118 IU/L (ref 44–121)
BUN/Creatinine Ratio: 27 — ABNORMAL HIGH (ref 10–24)
BUN: 46 mg/dL — ABNORMAL HIGH (ref 8–27)
Bilirubin Total: 0.7 mg/dL (ref 0.0–1.2)
Calcium: 10.8 mg/dL — ABNORMAL HIGH (ref 8.6–10.2)
Chloride: 103 mmol/L (ref 96–106)
Creatinine, Ser: 1.71 mg/dL — ABNORMAL HIGH (ref 0.76–1.27)
Globulin, Total: 3.6 g/dL (ref 1.5–4.5)
Glucose: 91 mg/dL (ref 70–99)
Potassium: 5.1 mmol/L (ref 3.5–5.2)
Sodium: 140 mmol/L (ref 134–144)
Total Protein: 7.9 g/dL (ref 6.0–8.5)
eGFR: 41 mL/min/{1.73_m2} — ABNORMAL LOW (ref >59)

## 2024-01-08 LAB — T4, FREE: Free T4: 2.06 ng/dL — ABNORMAL HIGH (ref 0.82–1.77)

## 2024-01-08 LAB — TSH: TSH: 3.87 u[IU]/mL (ref 0.450–4.500)

## 2024-01-09 ENCOUNTER — Telehealth: Payer: Self-pay | Admitting: Internal Medicine

## 2024-01-09 NOTE — Telephone Encounter (Signed)
 Patient identification verified by 2 forms. Dean Lucky, RN    Called and spoke to patient  Patient states:   -he has SOB of breath when laying down   -sometimes he has to take deep breaths   -symptoms on going for 1 month   -did not discuss symptoms at 3/4 OV   -SOB started before his 3/4 OV   -SOB has not worsened recently   -symptoms occurs once a day   -can occur while at rest   -continues to take 40 mg daily   -did not discuss symptoms at 4/10 OV   -completed Echo 1 week ago showed he had a leaky valve   -unsure if symptoms related to recent Echo finding  Patient denies:   -increased weight gain   -swelling in leg  Informed patient message sent for input/advisement  Reviewed ED warning signs/precautions  Patient verbalized understanding, no questions at this time

## 2024-01-09 NOTE — Telephone Encounter (Signed)
 Pt c/o Shortness Of Breath: STAT if SOB developed within the last 24 hours or pt is noticeably SOB on the phone  1. Are you currently SOB (can you hear that pt is SOB on the phone)? No  2. How long have you been experiencing SOB? 1 mo  3. Are you SOB when sitting or when up moving around? Mostly when laying down  4. Are you currently experiencing any other symptoms? No

## 2024-01-10 NOTE — Telephone Encounter (Signed)
 KJ, I am covering for Dr. Maximo Spar until he returns.  I think it would be a good idea to set this patient up for a transesophageal echo when you see him next week.  Might be a MitraClip candidate.

## 2024-01-10 NOTE — Telephone Encounter (Signed)
 Initial phone note was initially sent to Juanita Norlander, RN.  She was kind enough to forward me note to this afternoon.  Per phone note, patient reported having some shortness of breath that had been going on for 1 month, had not worsened recently.  He was wondering if these symptoms could be related to his recent echo results.  His recent echo from 01/01/2024 showed EF 30-35%, severely elevated PA systolic pressure, severe MR, moderate-severe TR.  I called patient to discuss symptoms further.  Patient reports that his symptoms are somewhat vague and hard to explain.  He does not necessarily feel like he is gasping for breath or struggling to breathe.  He is able to walk around well without developing shortness of breath. Reports that he just feels more tired than usual and every now and then has to take a deep breath.  He has been checking his oxygen at home using a pulse ox, readings have been between 97-99%.  He denies lower extremity swelling.  No weight gain.  His labs were recently checked by his PCP, patient reports that his hemoglobin had improved to 10.6.  We discussed his recent echocardiogram findings.  Discussed that as he has not gained any weight, is not short of breath with exertion, does not have lower extremity swelling, and oxygen saturations have been stable between 97-99%, it does not sound like his symptoms are related to extra fluid.  I instructed him to continue his current dose of Lasix.  Discussed that with his severe MR, and need to discuss with Dr. Maximo Spar the next best steps of his care.  Dr. Maximo Spar returns to office next week.  With his worsening EF, severe MR, I also wonder if he would benefit from advanced heart failure input.  I will also discuss this with Dr. Maximo Spar upon his return.  Patient has an appointment with me on 4/24.  Debria Fang, PA-C 01/10/2024 2:26 PM

## 2024-01-14 ENCOUNTER — Telehealth: Payer: Self-pay | Admitting: Internal Medicine

## 2024-01-14 MED ORDER — SPIRONOLACTONE 25 MG PO TABS: 12.5000 mg | ORAL_TABLET | Freq: Every day | ORAL | 0 refills | Status: DC

## 2024-01-14 NOTE — Telephone Encounter (Signed)
 Pt c/o swelling/edema: STAT if pt has developed SOB within 24 hours  If swelling, where is the swelling located? Bi-lat feet  How much weight have you gained and in what time span? unchanged  Have you gained 2 pounds in a day or 5 pounds in a week? unchanged  Do you have a log of your daily weights (if so, list)? Running around 187  Are you currently taking a fluid pill? yes  Are you currently SOB? No  Have you traveled recently in a car or plane for an extended period of time? No

## 2024-01-14 NOTE — Telephone Encounter (Signed)
 Called and spoke to pt about BLE edema, mainly in his feet only. Pt states, "feeling "tight"; hadn't swollen in about 4 years, weight is SAME (183-186 lbs)".  He also needs a refill on his spironolactone .  No other symptoms. Reached out to [employer], [place] about this.    Plan:  Take Furosemide  (lasix ) 40 mg BID until you see Lincoln Renshaw, PA on 01/17/2024.  Elevate legs when possible, will refill Spironolactone .  Pt verbalized understanding.

## 2024-01-17 ENCOUNTER — Ambulatory Visit: Attending: Cardiology | Admitting: Cardiology

## 2024-01-17 ENCOUNTER — Encounter: Payer: Self-pay | Admitting: Cardiology

## 2024-01-17 VITALS — BP 130/54 | HR 65 | Ht 66.0 in | Wt 189.0 lb

## 2024-01-17 DIAGNOSIS — I5022 Chronic systolic (congestive) heart failure: Secondary | ICD-10-CM

## 2024-01-17 DIAGNOSIS — I340 Nonrheumatic mitral (valve) insufficiency: Secondary | ICD-10-CM

## 2024-01-17 DIAGNOSIS — I2510 Atherosclerotic heart disease of native coronary artery without angina pectoris: Secondary | ICD-10-CM

## 2024-01-17 DIAGNOSIS — I10 Essential (primary) hypertension: Secondary | ICD-10-CM

## 2024-01-17 DIAGNOSIS — I472 Ventricular tachycardia, unspecified: Secondary | ICD-10-CM

## 2024-01-17 DIAGNOSIS — I480 Paroxysmal atrial fibrillation: Secondary | ICD-10-CM

## 2024-01-17 DIAGNOSIS — E785 Hyperlipidemia, unspecified: Secondary | ICD-10-CM

## 2024-01-17 DIAGNOSIS — Z7901 Long term (current) use of anticoagulants: Secondary | ICD-10-CM | POA: Diagnosis not present

## 2024-01-17 DIAGNOSIS — Z95810 Presence of automatic (implantable) cardiac defibrillator: Secondary | ICD-10-CM | POA: Diagnosis not present

## 2024-01-17 LAB — CBC

## 2024-01-17 NOTE — Patient Instructions (Addendum)
 Medication Instructions:  CONTINUE lasix  80mg  daily for 5 days.  Then decrease to 40mg  daily.   *If you need a refill on your cardiac medications before your next appointment, please call your pharmacy*  Lab Work: BMET, BNP, CBC today   Non-Fasting BMET and BNP on 4/30  This can be done at any [facility] OR the [facility] - 1st Floor -- no appointment needed  If you have labs (blood work) drawn today and your tests are completely normal, you will receive your results only by: MyChart Message (if you have MyChart) OR A paper copy in the mail If you have any lab test that is abnormal or we need to change your treatment, we will call you to review the results.   Follow-Up: At [facility], you and your health needs are our priority.  As part of our continuing mission to provide you with exceptional heart care, our providers are all part of one team.  This team includes your primary Cardiologist (physician) and Advanced Practice Providers or APPs (Physician Assistants and Nurse Practitioners) who all work together to provide you with the care you need, when you need it.  Your next appointment:    02/06/24 with Dr. Maximo Spar  We recommend signing up for the patient portal called "MyChart".  Sign up information is provided on this After Visit Summary.  MyChart is used to connect with patients for Virtual Visits (Telemedicine).  Patients are able to view lab/test results, encounter notes, upcoming appointments, etc.  Non-urgent messages can be sent to your provider as well.   To learn more about what you can do with MyChart, go to [email or web address].        1st Floor: - Lobby - Registration  - Pharmacy  - Lab - Cafe  2nd Floor: - PV Lab - Diagnostic Testing (echo, CT, nuclear med)  3rd Floor: - Vacant  4th Floor: - TCTS (cardiothoracic surgery) - AFib Clinic - Structural Heart Clinic - Vascular Surgery  - Vascular Ultrasound  5th Floor: - HeartCare  Cardiology (general and EP) - Clinical Pharmacy for coumadin, hypertension, lipid, weight-loss medications, and med management appointments    Valet parking services will be available as well.

## 2024-01-18 LAB — CBC
Hematocrit: 33.7 % — ABNORMAL LOW (ref 37.5–51.0)
Hemoglobin: 10.8 g/dL — ABNORMAL LOW (ref 13.0–17.7)
MCH: 28.6 pg (ref 26.6–33.0)
MCHC: 32 g/dL (ref 31.5–35.7)
MCV: 89 fL (ref 79–97)
Platelets: 168 10*3/uL (ref 150–450)
RBC: 3.78 x10E6/uL — ABNORMAL LOW (ref 4.14–5.80)
RDW: 14.8 % (ref 11.6–15.4)
WBC: 9.1 10*3/uL (ref 3.4–10.8)

## 2024-01-18 LAB — BASIC METABOLIC PANEL WITH GFR
BUN/Creatinine Ratio: 22 (ref 10–24)
BUN: 47 mg/dL — ABNORMAL HIGH (ref 8–27)
CO2: 21 mmol/L (ref 20–29)
Calcium: 10.2 mg/dL (ref 8.6–10.2)
Chloride: 100 mmol/L (ref 96–106)
Creatinine, Ser: 2.14 mg/dL — ABNORMAL HIGH (ref 0.76–1.27)
Glucose: 95 mg/dL (ref 70–99)
Potassium: 4.3 mmol/L (ref 3.5–5.2)
Sodium: 137 mmol/L (ref 134–144)
eGFR: 32 mL/min/{1.73_m2} — ABNORMAL LOW (ref >59)

## 2024-01-18 LAB — BRAIN NATRIURETIC PEPTIDE: BNP: 542.5 pg/mL — ABNORMAL HIGH (ref 0.0–100.0)

## 2024-01-22 ENCOUNTER — Telehealth: Payer: Self-pay

## 2024-01-22 DIAGNOSIS — I5022 Chronic systolic (congestive) heart failure: Secondary | ICD-10-CM

## 2024-01-22 DIAGNOSIS — Z95810 Presence of automatic (implantable) cardiac defibrillator: Secondary | ICD-10-CM

## 2024-01-22 DIAGNOSIS — I340 Nonrheumatic mitral (valve) insufficiency: Secondary | ICD-10-CM

## 2024-01-22 DIAGNOSIS — I480 Paroxysmal atrial fibrillation: Secondary | ICD-10-CM

## 2024-01-22 LAB — BASIC METABOLIC PANEL WITH GFR
BUN/Creatinine Ratio: 20 (ref 10–24)
BUN: 49 mg/dL — ABNORMAL HIGH (ref 8–27)
CO2: 17 mmol/L — ABNORMAL LOW (ref 20–29)
Calcium: 10.6 mg/dL — ABNORMAL HIGH (ref 8.6–10.2)
Chloride: 100 mmol/L (ref 96–106)
Creatinine, Ser: 2.49 mg/dL — ABNORMAL HIGH (ref 0.76–1.27)
Glucose: 81 mg/dL (ref 70–99)
Potassium: 5.5 mmol/L — ABNORMAL HIGH (ref 3.5–5.2)
Sodium: 137 mmol/L (ref 134–144)
eGFR: 26 mL/min/{1.73_m2} — ABNORMAL LOW (ref >59)

## 2024-01-22 LAB — BRAIN NATRIURETIC PEPTIDE: BNP: 587.6 pg/mL — ABNORMAL HIGH (ref 0.0–100.0)

## 2024-01-22 NOTE — Telephone Encounter (Signed)
-----   Message from Debria Fang sent at 01/22/2024  7:46 AM EDT ----- Please tell patient that his creatinine (marker of kidney damage) has continued to rise. Now up to 2.49. This suggests that we have got as much fluid out of his system as we can.  His K is also elevated to 5.5. With these lab findings, he should stop his spironolactone . He should also not take any lasix  today and should resume lasix  40 mg daily tomorrow. He should also avoid foods high in potassium (bananas, spinach, sweet potatoes, avocados)   Repeat BMP Friday 5/2

## 2024-01-22 NOTE — Telephone Encounter (Signed)
 Called patient advised of below they verbalized understanding Ordered labs

## 2024-01-26 LAB — BASIC METABOLIC PANEL WITH GFR
BUN/Creatinine Ratio: 21 (ref 10–24)
BUN: 46 mg/dL — ABNORMAL HIGH (ref 8–27)
CO2: 21 mmol/L (ref 20–29)
Calcium: 10.3 mg/dL — ABNORMAL HIGH (ref 8.6–10.2)
Chloride: 99 mmol/L (ref 96–106)
Creatinine, Ser: 2.19 mg/dL — ABNORMAL HIGH (ref 0.76–1.27)
Glucose: 91 mg/dL (ref 70–99)
Potassium: 4.6 mmol/L (ref 3.5–5.2)
Sodium: 136 mmol/L (ref 134–144)
eGFR: 31 mL/min/{1.73_m2} — ABNORMAL LOW (ref >59)

## 2024-01-26 LAB — HEPATIC FUNCTION PANEL
ALT: 8 IU/L (ref 0–44)
AST: 11 IU/L (ref 0–40)
Albumin: 3.9 g/dL (ref 3.8–4.8)
Alkaline Phosphatase: 92 IU/L (ref 44–121)
Bilirubin Total: 0.4 mg/dL (ref 0.0–1.2)
Bilirubin, Direct: 0.22 mg/dL (ref 0.00–0.40)
Total Protein: 6.9 g/dL (ref 6.0–8.5)

## 2024-01-28 ENCOUNTER — Telehealth: Payer: Self-pay

## 2024-01-28 ENCOUNTER — Ambulatory Visit: Attending: Cardiology

## 2024-01-28 DIAGNOSIS — Z95810 Presence of automatic (implantable) cardiac defibrillator: Secondary | ICD-10-CM | POA: Diagnosis not present

## 2024-01-28 DIAGNOSIS — I5022 Chronic systolic (congestive) heart failure: Secondary | ICD-10-CM | POA: Diagnosis not present

## 2024-01-28 NOTE — Telephone Encounter (Signed)
-----   Message from Debria Fang sent at 01/28/2024 10:48 AM EDT ----- Dean [name] also showed that his liver function has normalized. OK to resume lipitor 40 mg daily   Thanks KJ

## 2024-01-28 NOTE — Telephone Encounter (Signed)
 Called patient advised of lab results Patient verbalized understanding Reminded patient of appointment date and time.

## 2024-01-31 ENCOUNTER — Telehealth: Payer: Self-pay

## 2024-01-31 NOTE — Telephone Encounter (Signed)
 Remote ICM transmission received.  Attempted call to patient regarding ICM remote transmission and no answer.

## 2024-01-31 NOTE — Progress Notes (Signed)
 EPIC Encounter for ICM Monitoring  Patient Name: Dean Unrath Sr. is a 73 y.o. male Date: 01/31/2024 Primary Care Physican: Edda Goo, MD Primary Cardiologist: [facility] Electrophysiologist: Lawana Pray Nephrologist:  [facility] 01/31/2023 Weight:   183 lbs 06/21/2023 Weight: 183 lbs 07/24/2023 Weight: 183-184 lbs 11/20/2023 Hospital Discharge Weight: 190 lbs 12/25/2023 Weight: 182 lbs   Clinical Status Since 03-Jan-2024 Time in AF   0.0 hr/day (0.0%)(taking Eliquis )   Attempted call to patient and unable to reach.    Transmission results reviewed.    Diet: Typically does not limit salt intake.  Eats restaurant foods 2-3 times a week.   Optivol Thoracic impedance suggesting intermittent fluid accumulation starting 4/16 but trending close to baseline 5/5.   4/24-4/29 taking Lasix  80 mg daily for swelling of feet per 4/24 OV note then return to 40 mg daily on 4/30 per 4/29 phone note   Prescribed:  Furosemide  40 mg Take 1 tablet (40 mg total) by mouth daily.    Labs: 01/25/2024 Creatinine 2.19, BUN 46, Potassium 4.6, Sodium 136, GFR 31  01/21/2024 Creatinine 2.49, BUN 49, Potassium 5.5, Sodium 137, GFR 26  01/17/2024 Creatinine 2.14, BUN 47, Potassium 4.3, Sodium 137, GFR 32  01/03/2024 Creatinine 1.71, BUN 46, Potassium 5.1, Sodium 140, GFR 41 12/10/2023 Creatinine 2.18, BUN 52, Potassium 4.7, Sodium 134, GFR 31  11/19/2023 Creatinine 1.64, BUN 27, Potassium 3.6, Sodium 136  11/18/2023 Creatinine 1.81, BUN 35, Potassium 4.0, Sodium 142  11/17/2023 Creatinine 1.70, BUN 30, Potassium 3.1, Sodium 135 A complete set of results can be found in Results Review.   Recommendations:   Unable to reach.     Follow-up plan: ICM clinic phone appointment on 03/03/2024.   91 day device clinic remote transmission 02/14/2024.     EP/Cardiology Office Visits:   02/06/2024 with Dr Maximo Spar.  Recall 07/01/2024 with Dr Lawana Pray.   Copy of ICM check sent to Dr. Lawana Pray.  3 month ICM trend:  01/28/2024.    12-14 Month ICM trend:     Almyra Jain, RN 01/31/2024 9:15 AM

## 2024-02-04 ENCOUNTER — Telehealth: Payer: Self-pay

## 2024-02-04 NOTE — Telephone Encounter (Signed)
 Attempted return call to patient as requested by voice mail regarding outcome of 5/5 transmission results.  Left detailed message regarding results as requested.

## 2024-02-04 NOTE — Telephone Encounter (Addendum)
 ICM call to patient and reviewed 5/5 transmission results.  He stated he concerned about retaining fluid after Lasix  dosage was decreased.  Current weight is 183-187 lbs.  He has minimal swelling of feet intermittently during the last month but resolves at night. He has appointment with Dr Maximo Spar on 5/14.

## 2024-02-06 ENCOUNTER — Ambulatory Visit: Payer: No Typology Code available for payment source | Attending: Internal Medicine | Admitting: Internal Medicine

## 2024-02-06 ENCOUNTER — Encounter: Payer: Self-pay | Admitting: Internal Medicine

## 2024-02-06 VITALS — BP 136/64 | HR 79 | Ht 66.0 in | Wt 191.4 lb

## 2024-02-06 DIAGNOSIS — I5022 Chronic systolic (congestive) heart failure: Secondary | ICD-10-CM | POA: Diagnosis not present

## 2024-02-06 DIAGNOSIS — I482 Chronic atrial fibrillation, unspecified: Secondary | ICD-10-CM

## 2024-02-06 DIAGNOSIS — Z95810 Presence of automatic (implantable) cardiac defibrillator: Secondary | ICD-10-CM

## 2024-02-06 DIAGNOSIS — E785 Hyperlipidemia, unspecified: Secondary | ICD-10-CM

## 2024-02-06 DIAGNOSIS — N183 Chronic kidney disease, stage 3 unspecified: Secondary | ICD-10-CM

## 2024-02-06 MED ORDER — ATORVASTATIN CALCIUM 20 MG PO TABS
20.0000 mg | ORAL_TABLET | Freq: Every day | ORAL | 3 refills | Status: DC
Start: 2024-02-06 — End: 2024-04-20

## 2024-02-06 NOTE — Progress Notes (Signed)
 OFFICE NOTE  Chief Complaint:  No complaints  Primary Care Physician: Edda Goo, MD  HPI:  Dean Kovalenko Sr. is a 73 y.o. male with a past medial history significant for acute on chronic systolic congestive heart failure.  His LVEF is about 30 to 35% he has a known left bundle branch block and is status post AICD.  I first met him as a new cardiology patient in December 2017. He had a VT or VF arrest requiring CPR, defibrillation and medications.  He also has obstructive sleep apnea, hypertension, dyslipidemia and type 2 diabetes.  Since then he has had a number of hospitalization is been managed by my partners and I have not seen him back in the office.  More recently, he was hospitalized in September apparently after his PCP a cut back some of his diuretics possibly due to worsening renal function.  His device impedance trends have shown an increase suggesting volume overload.  He was also short of breath.  He required IV diuresis and was placed on Aldactone  in addition to his Lasix .  He had been transitioned over to valsartan  320 mg daily however he reports that he is felt somewhat fatigued since being on that medicine over the past year.  Of note his diastolic blood pressures are somewhat low around 50.  Since discharge though he has felt better with regards to heart failure.  His discharge weight was 250 pounds and now he is at 245 pounds.  He denies any worsening fluid overload or edema.  He also has paroxysmal atrial fibrillation and has had some short runs of A. fib that have been picked up on his Linq and device, however he is on amiodarone  and was in sinus rhythm while hospitalized.  10/20/2019  Dean [name] is seen today in the office in follow-up.  I last saw him via virtual visit about 3 months ago.  I decreased his valsartan  due to some issues with fatigue and low diastolic blood pressure.  His diuretic had been decreased from 80 twice a day to 80 once a day and then recently  he has been having some diarrhea.  He was advised to stop the Lasix  and has been off of it for about a week.  Weight does appear to be up somewhat since I last saw him but had come down prior to that.  He denies any worsening heart failure symptoms.  He had a recent remote check which showed no significant events on his ICD.  He also has a Linq.  01/23/2020  Dean [name] returns today for follow-up.  He appears somewhat short of breath today as well as is noted to be coughing.  Most recently had a remote device check on December 31, 2019.  This indicated an increased OptiVol which has been persistent for some time.  He was also seen by his PCP who increased his Lasix  from 80 twice daily to 80 3 times daily.  He has had recently some improvement in weight with that although continues to have a persistent cough of clear phlegm.  He does get some shortness of breath with exertion.  Blood pressure is elevated today 156/62.  Again his last LVEF was 30 to 35% as of July 2020.   05/13/2020  Dean [name] is seen today in follow-up.  Overall he is doing really well.  He has improved with regards to his edema after adjusting his diuretics.  BNP 3 months ago was 318.  Creatinine has been stable.  We repeated an echo which shows stable LVEF 30 to 35% and global hypokinesis.  I advised switching his valsartan  over to Entresto  49/51 mg twice daily.  He does have the medicine but has not yet started it.  He was also due for a remote check yesterday but that was missed.  I noted that the device clinic did call and try to leave a voicemail but his mailbox was full.  He said he would reach out to them and try to reschedule it.  I am interested to see whether he is having any further arrhythmias or whether we could consider stopping his amiodarone .  11/25/2021  Dean [name] returns today for follow-up.  Its been a couple years since we saw each other.  He did see Lawana Pray, NP in May 2022.  Overall he is felt to be doing  well at that time.  Recently since January I have received alerts from the device clinic indicating increasing thoracic impedance on his device suggestive of fluid gain.  Despite this his weight has been stable if not somewhat lower.  He denies any worsening shortness of breath.  He persistently takes 80 mg Lasix  daily.  His last echo in August 2021 showed LVEF 30 to 35%.  He denies any ICD firing.  I had recommended previously increasing his Entresto  to 49/51 mg twice daily.  Currently, however he is on the 24/26 mg twice daily dose.   02/06/2024  Dean [name] is seen today in follow-up.  He had a scare recently after elective colonoscopy in which he had GI bleeding afterwards.  This apparently was due to a clip that came loose.  He had some acute on chronic kidney injury with creatinine that went up to 2.49 a few weeks ago.  His Lasix  was reduced from 80 to 40 mg daily.  He also had some elevated liver enzymes.  His atorvastatin  and ezetimibe  were discontinued.  Those enzymes have come back to normal.  His cholesterol actually recently was quite low with LDL 24 in March.  Overall he denies any heart shocks.  He remains on amiodarone  and has not had no ventricular arrhythmias.  He is followed closely by the device clinic.  Recent interrogations of his device show stable lead parameters.  PMHx:  Past Medical History:  Diagnosis Date   AICD (automatic cardioverter/defibrillator) present 10/10/2016   a. 09/2016 s/p MDT Visia AF MRI single lead ICD (ser # [id number]).   Arthritis    "maybe in his spine" (09/05/2016)   Asthma    pt. denies   Cardiac arrest (HCC)    a. 08/2016 VT/VF Arrest-->09/2016 s/p MDT Visia AF MRI single lead ICD (ser # [id number]).   Chronic HFrEF (heart failure with reduced ejection fraction) (HCC)    a. 08/2016 Echo: EF 35-40%; b. 04/2018 Echo: EF 30-35%; c. 04/2020 Echo: EF 30-35%; d. 11/2021 Echo: EF 40-45%, GrI DD, nl RV fxn, sev dil LA, mild-mod MR, mild-mod AI, Ao root 38mm.    Coronary artery disease    a. 08/2016 VT Arrest/Cath: LM nl, LAD nl, LCX nl, RGCA 90p, 100d w/ L->R collats, EF 35-45%.   Gout    Hyperlipidemia LDL goal <70    Hypertension    Ischemic cardiomyopathy    a. 08/2016 Echo: EF 35-40%; b. 04/2018 Echo: EF 30-35%; c. 04/2020 Echo: EF 30-35%; d. 11/2021 Echo: EF 40-45%.   LBBB (left bundle branch block)    OSA on CPAP    does not wear CPAP  PAF (paroxysmal atrial fibrillation) (HCC)    a. CHA2DS2VASc = 5-->Eliquis .   Type II diabetes mellitus (HCC)     Past Surgical History:  Procedure Laterality Date   CARDIAC CATHETERIZATION N/A 09/07/2016   Procedure: Left Heart Cath and Coronary Angiography;  Surgeon: Peter M Swaziland, MD;  Location: [facility] INVASIVE CV LAB;  Service: Cardiovascular;  Laterality: N/A;   COLONOSCOPY WITH PROPOFOL  N/A 08/30/2018   Procedure: COLONOSCOPY WITH PROPOFOL ;  Surgeon: Alvis Jourdain, MD;  Location: [facility] ENDOSCOPY;  Service: Endoscopy;  Laterality: N/A;   COLONOSCOPY WITH PROPOFOL  N/A 11/16/2023   Procedure: COLONOSCOPY WITH PROPOFOL ;  Surgeon: Alvis Jourdain, MD;  Location: [facility] ENDOSCOPY;  Service: Gastroenterology;  Laterality: N/A;   COLONOSCOPY WITH PROPOFOL  N/A 11/18/2023   Procedure: COLONOSCOPY WITH PROPOFOL ;  Surgeon: Lajuan Pila, MD;  Location: [facility] ENDOSCOPY;  Service: Gastroenterology;  Laterality: N/A;   EP IMPLANTABLE DEVICE N/A 10/10/2016   Procedure: ICD Implant;  Surgeon: Will Cortland Ding, MD;  Location: [facility] INVASIVE CV LAB;  Service: Cardiovascular;  Laterality: N/A;   HEMOSTASIS CLIP PLACEMENT  11/16/2023   Procedure: HEMOSTASIS CLIP PLACEMENT;  Surgeon: Alvis Jourdain, MD;  Location: [facility] ENDOSCOPY;  Service: Gastroenterology;;   HEMOSTASIS CLIP PLACEMENT  11/18/2023   Procedure: HEMOSTASIS CLIP PLACEMENT;  Surgeon: Lajuan Pila, MD;  Location: [facility] ENDOSCOPY;  Service: Gastroenterology;;   HEMOSTASIS CONTROL  11/18/2023   Procedure: HEMOSTASIS CONTROL;  Surgeon: Lajuan Pila, MD;  Location: [facility];  Service:  Gastroenterology;;  PuraStat   LUMBAR LAMINECTOMY/DECOMPRESSION MICRODISCECTOMY N/A 01/18/2018   Procedure: L2-3, L3-4, L-4-5 CENTRAL DECOMPRESSION;  Surgeon: Adah Acron, MD;  Location: [facility] OR;  Service: Orthopedics;  Laterality: N/A;   LUMBAR LAMINECTOMY/DECOMPRESSION MICRODISCECTOMY N/A 08/12/2020   Procedure: THORACIC ELEVEN LAMINECTOMY;  Surgeon: Agustina Aldrich, MD;  Location: [facility] OR;  Service: Neurosurgery;  Laterality: N/A;   POLYPECTOMY  08/30/2018   Procedure: POLYPECTOMY;  Surgeon: Alvis Jourdain, MD;  Location: [facility] ENDOSCOPY;  Service: Endoscopy;;   POLYPECTOMY  11/16/2023   Procedure: POLYPECTOMY;  Surgeon: Alvis Jourdain, MD;  Location: [facility] ENDOSCOPY;  Service: Gastroenterology;;    FAMHx:  Family History  Problem Relation Age of Onset   Heart attack Father        died in his 24's   Hypertension Sister    Cancer Brother        uncertain, "leg"   Hypertension Sister     SOCHx:   reports that he has never smoked. He has never used smokeless tobacco. He reports that he does not drink alcohol and does not use drugs.  ALLERGIES:  Allergies  Allergen Reactions   Contrast Media [Iodinated Contrast Media] Shortness Of Breath and Nausea And Vomiting   Lisinopril  Cough    ROS: Pertinent items noted in HPI and remainder of comprehensive ROS otherwise negative.  HOME MEDS: Current Outpatient Medications on File Prior to Visit  Medication Sig Dispense Refill   allopurinol  (ZYLOPRIM ) 300 MG tablet Take 300 mg by mouth daily.     amiodarone  (PACERONE ) 200 MG tablet Take 1 tablet (200 mg total) by mouth daily. 90 tablet 1   atorvastatin  (LIPITOR) 40 MG tablet Take 1 tablet (40 mg total) by mouth daily at 6 PM. (Patient taking differently: Take 40 mg by mouth daily. On hold) 30 tablet 5   carvedilol  (COREG ) 12.5 MG tablet TAKE 1 TABLET (12.5MG  TOTAL) BY MOUTH TWICE A DAY WITH MEALS 180 tablet 3   cholecalciferol  (VITAMIN D3) 25 MCG (1000 UNIT) tablet Take 1,000 Units by mouth daily.  ELIQUIS  5 MG TABS tablet TAKE 1 TABLET BY MOUTH TWICE A DAY 180 tablet 2   ezetimibe  (ZETIA ) 10 MG tablet Take 1 tablet (10 mg total) by mouth daily. (Patient taking differently: Take 10 mg by mouth daily. On hold) 90 tablet 2   ferrous sulfate 325 (65 FE) MG tablet Take 325 mg by mouth daily.     furosemide  (LASIX ) 40 MG tablet Take 1 tablet (40 mg total) by mouth daily. 90 tablet 1   valsartan  (DIOVAN ) 40 MG tablet Take 1 tablet (40 mg total) by mouth daily. 90 tablet 3   magnesium  oxide (MAG-OX) 400 (240 Mg) MG tablet TAKE 1 TABLET BY MOUTH EVERY DAY (Patient not taking: Reported on 02/06/2024) 90 tablet 0   No current facility-administered medications on file prior to visit.    LABS/IMAGING: No results found for this or any previous visit (from the past 48 hours). No results found.  LIPID PANEL:    Component Value Date/Time   CHOL 68 (L) 11/27/2023 1447   TRIG 62 11/27/2023 1447   HDL 29 (L) 11/27/2023 1447   CHOLHDL 2.3 11/27/2023 1447   CHOLHDL 3.1 08/25/2022 0400   VLDL 7 08/25/2022 0400   LDLCALC 24 11/27/2023 1447     WEIGHTS: Wt Readings from Last 3 Encounters:  02/06/24 191 lb 6.4 oz (86.8 kg)  01/17/24 189 lb (85.7 kg)  01/03/24 188 lb (85.3 kg)    VITALS: BP 136/64 (BP Location: Left Arm, Patient Position: Sitting)   Pulse 79   Ht 5\' 6"  (1.676 m)   Wt 191 lb 6.4 oz (86.8 kg)   SpO2 96%   BMI 30.89 kg/m   EXAM: General appearance: alert, no distress, and morbidly obese Neck: no carotid bruit, no JVD, and thyroid not enlarged, symmetric, no tenderness/mass/nodules Lungs: clear to auscultation bilaterally Heart: regular rate and rhythm Abdomen: soft, non-tender; bowel sounds normal; no masses,  no organomegaly Extremities: edema trace to 1+ bilateral LE Pulses: 2+ and symmetric Skin: Skin color, texture, turgor normal. No rashes or lesions Neurologic: Grossly normal Psych: Pleasant  EKG: EKG Interpretation Date/Time:  Wednesday Feb 06 2024 10:45:51  EDT Ventricular Rate:  79 PR Interval:  256 QRS Duration:  178 QT Interval:  466 QTC Calculation: 534 R Axis:   -41  Text Interpretation: Sinus rhythm with 1st degree A-V block Left axis deviation Left bundle branch block When compared with ECG of 03-Jan-2024 15:06, No significant change since last tracing Confirmed by Dinah Franco [phone]) on 02/06/2024 10:51:30 AM    ASSESSMENT: Chronic systolic congestive heart failure, LVEF 30 to 35%, NYHA class II symptoms History of VT/VF arrest status post AICD Paroxysmal atrial fibrillation 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   Mr. Burningham had recent GI bleeding in association with elective colonoscopy.  He had some acute on chronic kidney injury and his B6 dose was decreased from 80 to 40 mg daily.  He reports his edema is somewhat better.  He has seen nephrology some.  His potassium was 5.5 a few weeks ago but is back to 4.6.  His liver enzymes were abnormal but have totally normalized off of his atorvastatin  and ezetimibe .  His LDL recently in March was 24 therefore I think we can restart his statin at a lower 20 mg daily dose.  We will not restart the ezetimibe .  He should continue with lower dose Lasix .  He remains on amiodarone .  He is followed  closely by the device clinic.  Will plan repeat lipids and a comprehensive metabolic profile in about 3 months and follow-up afterwards with an APP.  Hazle Lites, MD, [facility], FNLA, FACP  [place]  [facility]  Medical Director of the Advanced Lipid Disorders &  Cardiovascular Risk Reduction Clinic Diplomate of the American Board of Clinical Lipidology Attending Cardiologist  Direct Dial: [phone]  Fax: [phone]  Website:  [email or web address] Viney 02/06/2024, 10:51 AM

## 2024-02-06 NOTE — Patient Instructions (Signed)
 Medication Instructions:  REMAIN off zetia   RESUME atorvastatin  at a lower dose of 20mg  daily   CONTINUE all other current medications  *If you need a refill on your cardiac medications before your next appointment, please call your pharmacy*  Lab Work: FASTING lab work in 3 months -- lipid panel, CMET  If you have labs (blood work) drawn today and your tests are completely normal, you will receive your results only by: MyChart Message (if you have MyChart) OR A paper copy in the mail If you have any lab test that is abnormal or we need to change your treatment, we will call you to review the results.   Follow-Up: At [facility], you and your health needs are our priority.  As part of our continuing mission to provide you with exceptional heart care, our providers are all part of one team.  This team includes your primary Cardiologist (physician) and Advanced Practice Providers or APPs (Physician Assistants and Nurse Practitioners) who all work together to provide you with the care you need, when you need it.  Your next appointment:    3 months with Liane Redman PA  We recommend signing up for the patient portal called "MyChart".  Sign up information is provided on this After Visit Summary.  MyChart is used to connect with patients for Virtual Visits (Telemedicine).  Patients are able to view lab/test results, encounter notes, upcoming appointments, etc.  Non-urgent messages can be sent to your provider as well.   To learn more about what you can do with MyChart, go to [email or web address].

## 2024-02-14 ENCOUNTER — Ambulatory Visit (INDEPENDENT_AMBULATORY_CARE_PROVIDER_SITE_OTHER): Payer: Self-pay

## 2024-02-14 ENCOUNTER — Ambulatory Visit: Payer: Self-pay | Admitting: Cardiology

## 2024-02-14 DIAGNOSIS — I255 Ischemic cardiomyopathy: Secondary | ICD-10-CM | POA: Diagnosis not present

## 2024-02-14 LAB — CUP PACEART REMOTE DEVICE CHECK
Battery Remaining Longevity: 36 mo
Battery Voltage: 2.96 V
Brady Statistic RV Percent Paced: 0.01 %
Date Time Interrogation Session: 20250522063525
HighPow Impedance: 49 Ohm
Implantable Lead Connection Status: 753985
Implantable Lead Implant Date: 20180116
Implantable Lead Location: 753860
Implantable Lead Model: 6935
Implantable Pulse Generator Implant Date: 20180116
Lead Channel Impedance Value: 247 Ohm
Lead Channel Impedance Value: 323 Ohm
Lead Channel Pacing Threshold Amplitude: 1 V
Lead Channel Pacing Threshold Pulse Width: 0.4 ms
Lead Channel Sensing Intrinsic Amplitude: 10.25 mV
Lead Channel Sensing Intrinsic Amplitude: 10.25 mV
Lead Channel Setting Pacing Amplitude: 2.5 V
Lead Channel Setting Pacing Pulse Width: 0.4 ms
Lead Channel Setting Sensing Sensitivity: 0.3 mV
Zone Setting Status: 755011
Zone Setting Status: 755011

## 2024-02-20 ENCOUNTER — Ambulatory Visit: Payer: Self-pay

## 2024-02-20 LAB — COMPREHENSIVE METABOLIC PANEL WITH GFR
ALT: 8 IU/L (ref 0–44)
AST: 12 IU/L (ref 0–40)
Albumin: 4 g/dL (ref 3.8–4.8)
Alkaline Phosphatase: 101 IU/L (ref 44–121)
BUN/Creatinine Ratio: 21 (ref 10–24)
BUN: 42 mg/dL — ABNORMAL HIGH (ref 8–27)
Bilirubin Total: 0.5 mg/dL (ref 0.0–1.2)
CO2: 18 mmol/L — ABNORMAL LOW (ref 20–29)
Calcium: 10.1 mg/dL (ref 8.6–10.2)
Chloride: 106 mmol/L (ref 96–106)
Creatinine, Ser: 1.97 mg/dL — ABNORMAL HIGH (ref 0.76–1.27)
Globulin, Total: 2.9 g/dL (ref 1.5–4.5)
Glucose: 84 mg/dL (ref 70–99)
Potassium: 4.8 mmol/L (ref 3.5–5.2)
Sodium: 142 mmol/L (ref 134–144)
Total Protein: 6.9 g/dL (ref 6.0–8.5)
eGFR: 35 mL/min/{1.73_m2} — ABNORMAL LOW (ref >59)

## 2024-02-20 LAB — LIPID PANEL
Chol/HDL Ratio: 2.6 ratio (ref 0.0–5.0)
Cholesterol, Total: 103 mg/dL (ref 100–199)
HDL: 40 mg/dL (ref >39)
LDL Chol Calc (NIH): 52 mg/dL (ref 0–99)
Triglycerides: 45 mg/dL (ref 0–149)
VLDL Cholesterol Cal: 11 mg/dL (ref 5–40)

## 2024-03-03 ENCOUNTER — Ambulatory Visit: Attending: Cardiology

## 2024-03-03 ENCOUNTER — Telehealth: Payer: Self-pay

## 2024-03-03 DIAGNOSIS — I5022 Chronic systolic (congestive) heart failure: Secondary | ICD-10-CM

## 2024-03-03 DIAGNOSIS — Z95810 Presence of automatic (implantable) cardiac defibrillator: Secondary | ICD-10-CM

## 2024-03-03 NOTE — Telephone Encounter (Signed)
 Attempted call to patient regarding ICM remote transmission and left message to sent manual remote transmission. Left message with call back number.

## 2024-03-03 NOTE — Telephone Encounter (Signed)
 Remote ICM transmission received.  Attempted call to patient regarding ICM remote transmission and no answer.

## 2024-03-03 NOTE — Telephone Encounter (Signed)
 Returned patient call and requested to send remote transmission for fluid level review and he agreed to send manual remote transmission.

## 2024-03-03 NOTE — Progress Notes (Signed)
 EPIC Encounter for ICM Monitoring  Patient Name: Dean Thone Sr. is a 73 y.o. male Date: 03/03/2024 Primary Care Physican: Edda Goo, MD Primary Cardiologist: [facility] Electrophysiologist: Lawana Pray Nephrologist:  [facility] 01/31/2023 Weight:   183 lbs 06/21/2023 Weight: 183 lbs 07/24/2023 Weight: 183-184 lbs 11/20/2023 Hospital Discharge Weight: 190 lbs 12/25/2023 Weight: 182 lbs 02/06/2024 Office Weight: 191 lbs 03/04/2024 Weight: 188 lbs    Clinical Status Since 14-Feb-2024 Time in AF   0.0 hr/day (0.0%)(taking Eliquis )   Spoke with patient and heart failure questions reviewed.  Transmission results reviewed.  Pt had swelling in feet this past weekend and took extra Furosemide  6/9 and swelling resolved today.   He reports weight is stable and no difficulty breathing.    Diet: He is not strict with limiting salt but eats restaurant foods a couple of times a month.   Optivol Thoracic impedance suggesting intermittent fluid accumulation starting 5/23 but trending back toward baseline.  Fluid index trending greater than normal threshold starting 6/7.   Prescribed:  Furosemide  40 mg Take 1 tablet (40 mg total) by mouth daily.   6/10 Pt reports he takes extra Furosemide  when he has swelling.   Labs: 01/25/2024 Creatinine 2.19, BUN 46, Potassium 4.6, Sodium 136, GFR 31  01/21/2024 Creatinine 2.49, BUN 49, Potassium 5.5, Sodium 137, GFR 26  01/17/2024 Creatinine 2.14, BUN 47, Potassium 4.3, Sodium 137, GFR 32  01/03/2024 Creatinine 1.71, BUN 46, Potassium 5.1, Sodium 140, GFR 41 12/10/2023 Creatinine 2.18, BUN 52, Potassium 4.7, Sodium 134, GFR 31  11/19/2023 Creatinine 1.64, BUN 27, Potassium 3.6, Sodium 136  11/18/2023 Creatinine 1.81, BUN 35, Potassium 4.0, Sodium 142  11/17/2023 Creatinine 1.70, BUN 30, Potassium 3.1, Sodium 135 A complete set of results can be found in Results Review.   Recommendations:   Copy sent to Dr Maximo Spar for review and recommendations if needed.     Follow-up plan: ICM clinic phone appointment on 03/10/2024 to recheck fluid levels.   91 day device clinic remote transmission 05/15/2024.     EP/Cardiology Office Visits:   05/19/2024 with Liane Redman, PA.  Recall 07/01/2024 with Dr Lawana Pray.   Copy of ICM check sent to Dr. Lawana Pray.  3 month ICM trend: 03/03/2024.    12-14 Month ICM trend:     Almyra Jain, RN 03/03/2024 3:52 PM

## 2024-03-04 NOTE — Telephone Encounter (Signed)
 Attempted call to patient to review transmission results but no answer.

## 2024-03-07 NOTE — Progress Notes (Signed)
No recommendations received.

## 2024-03-10 ENCOUNTER — Encounter

## 2024-03-12 NOTE — Progress Notes (Signed)
 No ICM remote transmission received for 03/13/2024 and next ICM transmission scheduled for 04/28/2024.

## 2024-03-25 NOTE — Progress Notes (Signed)
 Remote ICD transmission.

## 2024-03-31 ENCOUNTER — Emergency Department (HOSPITAL_COMMUNITY)

## 2024-03-31 ENCOUNTER — Other Ambulatory Visit: Payer: Self-pay

## 2024-03-31 ENCOUNTER — Emergency Department (HOSPITAL_COMMUNITY)
Admission: EM | Admit: 2024-03-31 | Discharge: 2024-04-01 | Disposition: A | Discharge status: Home / Self Care | Attending: Emergency Medicine | Admitting: Emergency Medicine

## 2024-03-31 DIAGNOSIS — Z7901 Long term (current) use of anticoagulants: Secondary | ICD-10-CM | POA: Diagnosis not present

## 2024-03-31 DIAGNOSIS — R531 Weakness: Secondary | ICD-10-CM | POA: Diagnosis present

## 2024-03-31 DIAGNOSIS — N3000 Acute cystitis without hematuria: Secondary | ICD-10-CM | POA: Insufficient documentation

## 2024-03-31 DIAGNOSIS — I5042 Chronic combined systolic (congestive) and diastolic (congestive) heart failure: Secondary | ICD-10-CM | POA: Insufficient documentation

## 2024-03-31 LAB — URINALYSIS, ROUTINE W REFLEX MICROSCOPIC
Bilirubin Urine: NEGATIVE
Glucose, UA: NEGATIVE mg/dL
Ketones, ur: NEGATIVE mg/dL
Nitrite: NEGATIVE
Protein, ur: 30 mg/dL — AB
Specific Gravity, Urine: 1.015 (ref 1.005–1.030)
WBC, UA: >50 WBC/hpf (ref 0–5)
pH: 5 (ref 5.0–8.0)

## 2024-03-31 LAB — COMPREHENSIVE METABOLIC PANEL WITH GFR
ALT: 16 U/L (ref 0–44)
AST: 22 U/L (ref 15–41)
Albumin: 3.7 g/dL (ref 3.5–5.0)
Alkaline Phosphatase: 79 U/L (ref 38–126)
Anion gap: 10 (ref 5–15)
BUN: 39 mg/dL — ABNORMAL HIGH (ref 8–23)
CO2: 19 mmol/L — ABNORMAL LOW (ref 22–32)
Calcium: 9.7 mg/dL (ref 8.9–10.3)
Chloride: 107 mmol/L (ref 98–111)
Creatinine, Ser: 1.77 mg/dL — ABNORMAL HIGH (ref 0.61–1.24)
GFR, Estimated: 40 mL/min — ABNORMAL LOW (ref >60)
Glucose, Bld: 106 mg/dL — ABNORMAL HIGH (ref 70–99)
Potassium: 3.6 mmol/L (ref 3.5–5.1)
Sodium: 136 mmol/L (ref 135–145)
Total Bilirubin: 1.2 mg/dL (ref 0.0–1.2)
Total Protein: 7.6 g/dL (ref 6.5–8.1)

## 2024-03-31 LAB — CBC
HCT: 33.5 % — ABNORMAL LOW (ref 39.0–52.0)
Hemoglobin: 10.3 g/dL — ABNORMAL LOW (ref 13.0–17.0)
MCH: 28 pg (ref 26.0–34.0)
MCHC: 30.7 g/dL (ref 30.0–36.0)
MCV: 91 fL (ref 80.0–100.0)
Platelets: 164 K/uL (ref 150–400)
RBC: 3.68 MIL/uL — ABNORMAL LOW (ref 4.22–5.81)
RDW: 15.6 % — ABNORMAL HIGH (ref 11.5–15.5)
WBC: 9.4 K/uL (ref 4.0–10.5)
nRBC: 0 % (ref 0.0–0.2)

## 2024-03-31 LAB — CBG MONITORING, ED: Glucose-Capillary: 104 mg/dL — ABNORMAL HIGH (ref 70–99)

## 2024-03-31 LAB — BRAIN NATRIURETIC PEPTIDE: B Natriuretic Peptide: 1652.3 pg/mL — ABNORMAL HIGH (ref 0.0–100.0)

## 2024-03-31 MED ORDER — CEFPODOXIME PROXETIL 200 MG PO TABS: 200.0000 mg | ORAL_TABLET | Freq: Two times a day (BID) | ORAL | 0 refills | Status: AC

## 2024-03-31 NOTE — ED Provider Triage Note (Signed)
 Emergency Medicine Provider Triage Evaluation Note  Dean Heritage Sr. , a 73 y.o. male  was evaluated in triage.  Pt complains of generalized weakness and fatigue. Denies any chest pain, abdominal pain, nausea, or vomiting. Reports some orthopnea. He reports it's hard to describe and he thinks that his creatinine has been up. Denies any recent illness, cough or cold symptoms  Review of Systems  Positive:  Negative:   Physical Exam  BP (!) 151/71 (BP Location: Left Arm)   Pulse 82   Temp 98.1 F (36.7 C) (Oral)   Resp 18   Ht 5' 6 (1.676 m)   Wt 83.9 kg   SpO2 100%   BMI 29.86 kg/m  Gen:   Awake, no distress   Resp:  Normal effort  MSK:   Moves extremities without difficulty  Other:  Speaking in full sentences with ease.  Medical Decision Making  Medically screening exam initiated at 4:05 PM.  Appropriate orders placed.  Dean Croft Sr. was informed that the remainder of the evaluation will be completed by another provider, this initial triage assessment does not replace that evaluation, and the importance of remaining in the ED until their evaluation is complete.  Labs ordered   Dean [name], [place] 03/31/24 [id number]

## 2024-03-31 NOTE — ED Triage Notes (Signed)
 Patient to ED by POV with c/o weakness. Per patient he has been feel;ing weak for  3-4 days new DX of CHF. Denies pain, N/V/fever.

## 2024-03-31 NOTE — ED Provider Notes (Signed)
 [place] EMERGENCY DEPARTMENT AT [facility] Provider Note   CSN: [id number] Arrival date & time: 03/31/24  1513     Patient presents with: Weakness   Dean Bartoszek Sr. is a 73 y.o. male.    Weakness   Patient presents because of weakness.  He states has been feeling weak over the past couple days.  Feels like he is noted some slight swelling of his lower extremities.  No pain in his lower extremities.  No chest pain.  Does endorse some slight orthopnea at times.  States is better when he is sitting up at times.  Feels like he is maybe a little more constipated than baseline.  No real abdominal pain.  Stills feels like he is been weak over the past couple days.  No fever no chills.  No sick contacts.  No dysuria.    Previous medical history reviewed : Patient was admitted in February 2025.  Was been in because of acute GI bleeding.  Chronic combined systolic and diastolic CHF.  Polypectomy site that was bleeding at that time.   Prior to Admission medications   Medication Sig Start Date End Date Taking? Authorizing Provider  cefpodoxime  (VANTIN ) 200 MG tablet Take 1 tablet (200 mg total) by mouth 2 (two) times daily for 10 days. 03/31/24 04/10/24 Yes Simon Lavonia SAILOR, MD  allopurinol  (ZYLOPRIM ) 300 MG tablet Take 300 mg by mouth daily. 06/13/23   [provider]  amiodarone  (PACERONE ) 200 MG tablet Take 1 tablet (200 mg total) by mouth daily. 09/06/22   Leverne Charlies Helling, PA-C  atorvastatin  (LIPITOR) 20 MG tablet Take 1 tablet (20 mg total) by mouth daily. 02/06/24 05/06/24  Mona Vinie BROCKS, MD  carvedilol  (COREG ) 12.5 MG tablet TAKE 1 TABLET (12.5MG  TOTAL) BY MOUTH TWICE A DAY WITH MEALS 08/28/23   Leverne Charlies Helling, PA-C  cholecalciferol  (VITAMIN D3) 25 MCG (1000 UNIT) tablet Take 1,000 Units by mouth daily. 06/13/23   [provider]  ELIQUIS  5 MG TABS tablet TAKE 1 TABLET BY MOUTH TWICE A DAY 04/20/23   Camnitz, Soyla Lunger, MD  ferrous sulfate 325 (65 FE)  MG tablet Take 325 mg by mouth daily. 11/08/23   [provider]  furosemide  (LASIX ) 40 MG tablet Take 1 tablet (40 mg total) by mouth daily. 12/12/23   Vicci Rollo SAUNDERS, PA-C  magnesium  oxide (MAG-OX) 400 (240 Mg) MG tablet TAKE 1 TABLET BY MOUTH EVERY DAY Patient not taking: Reported on 02/06/2024 09/06/22   Mona Vinie BROCKS, MD  valsartan  (DIOVAN ) 40 MG tablet Take 1 tablet (40 mg total) by mouth daily. 11/27/23   Vicci Rollo SAUNDERS, PA-C    Allergies: Contrast media [iodinated contrast media] and Lisinopril     Review of Systems  Neurological:  Positive for weakness.    Updated Vital Signs BP (!) 147/67   Pulse 76   Temp 98.5 F (36.9 C) (Oral)   Resp 16   Ht 5' 6 (1.676 m)   Wt 83.9 kg   SpO2 100%   BMI 29.86 kg/m   Physical Exam Vitals and nursing note reviewed.  Constitutional:      General: He is not in acute distress.    Appearance: He is well-developed.  HENT:     Head: Normocephalic and atraumatic.  Eyes:     Conjunctiva/sclera: Conjunctivae normal.  Cardiovascular:     Rate and Rhythm: Normal rate and regular rhythm.     Heart sounds: No murmur heard. Pulmonary:  Effort: Pulmonary effort is normal. No respiratory distress.     Breath sounds: Normal breath sounds.  Abdominal:     Palpations: Abdomen is soft.     Tenderness: There is no abdominal tenderness.  Musculoskeletal:        General: No swelling.     Cervical back: Neck supple.  Skin:    General: Skin is warm and dry.     Capillary Refill: Capillary refill takes less than 2 seconds.  Neurological:     Mental Status: He is alert.  Psychiatric:        Mood and Affect: Mood normal.     (all labs ordered are listed, but only abnormal results are displayed) Labs Reviewed  COMPREHENSIVE METABOLIC PANEL WITH GFR - Abnormal; Notable for the following components:      Result Value   CO2 19 (*)    Glucose, Bld 106 (*)    BUN 39 (*)    Creatinine, Ser 1.77 (*)    GFR, Estimated 40 (*)     All other components within normal limits  CBC - Abnormal; Notable for the following components:   RBC 3.68 (*)    Hemoglobin 10.3 (*)    HCT 33.5 (*)    RDW 15.6 (*)    All other components within normal limits  URINALYSIS, ROUTINE W REFLEX MICROSCOPIC - Abnormal; Notable for the following components:   APPearance HAZY (*)    Hgb urine dipstick SMALL (*)    Protein, ur 30 (*)    Leukocytes,Ua LARGE (*)    Bacteria, UA MANY (*)    All other components within normal limits  BRAIN NATRIURETIC PEPTIDE - Abnormal; Notable for the following components:   B Natriuretic Peptide 1,652.3 (*)    All other components within normal limits  CBG MONITORING, ED - Abnormal; Notable for the following components:   Glucose-Capillary 104 (*)    All other components within normal limits    EKG: EKG Interpretation Date/Time:  Monday March 31 2024 16:13:55 EDT Ventricular Rate:  77 PR Interval:  250 QRS Duration:  187 QT Interval:  445 QTC Calculation: 504 R Axis:   254  Text Interpretation: Sinus rhythm Ventricular premature complex Prolonged PR interval Nonspecific IVCD with LAD Abnormal lateral Q waves Anterior infarct, old Confirmed by Simon Rea [phone]) on 03/31/2024 9:46:03 PM  Radiology: DG Chest 2 View Result Date: 03/31/2024 CLINICAL DATA:  Generalized weakness EXAM: CHEST - 2 VIEW COMPARISON:  CT 06/29/2020 FINDINGS: Left-sided single lead pacing device. Cardiomegaly with vascular congestion and probable small right effusion. Aortic atherosclerosis. No pneumothorax. Tubular air containing structure beneath the right diaphragm with air containing bowel in the right upper quadrant. IMPRESSION: 1. Cardiomegaly with vascular congestion and probable small right effusion. 2. Tubular air containing structure beneath the right diaphragm, suspect air-filled high-riding loop of bowel but given change in appearance compared to prior radiographs, free air not excluded and suggest CT for further  evaluation. Electronically Signed   By: Luke Bun M.D.   On: 03/31/2024 17:19     Procedures   Medications Ordered in the ED - No data to display                                  Medical Decision Making Amount and/or Complexity of Data Reviewed Labs: ordered. Radiology: ordered.  Risk Prescription drug management.   Previous medical history reviewed : Patient was admitted in  February 2025.  Was been in because of acute GI bleeding.  Chronic combined systolic and diastolic CHF.  Polypectomy site that was bleeding at that time.   Patient presents because of weakness.  He states has been feeling weak over the past couple days.  Feels like he is noted some slight swelling of his lower extremities.  No pain in his lower extremities.  No chest pain.  Does endorse some slight orthopnea at times.  States is better when he is sitting up at times.  Feels like he is maybe a little more constipated than baseline.  No real abdominal pain.  Stills feels like he is been weak over the past couple days.  No fever no chills.  No sick contacts.  No dysuria.   On exam, patient medical stable.  ANO x 3 GCS 15.  No focal deficits.  Cranials 2 through 12 intact.  Given fatigue, did obtain infectious workup.  Did obtain chest x-ray.  No infiltrate.  Did show some abnormal bowel gas pattern in the epigastric region.  Therefore, will obtain CT scan abdomen without contrast.  Obtained without contrast in setting of patient's history of allergy to contrast as well as patient's low GFR.   UA consistent for UTI.  Will start patient on cefpodoxime .   Creatinine 1.77 which is around patient's baseline. Hgb at baseline. No blood in stool.   Patient set out stable condition ending CT scan of the abdomen.  If this is negative, patient will likely be able to go home in the setting of UTI.       Final diagnoses:  Acute cystitis without hematuria  Weakness    ED Discharge Orders          Ordered     cefpodoxime  (VANTIN ) 200 MG tablet  2 times daily        03/31/24 2300               Simon Lavonia SAILOR, MD 03/31/24 2323

## 2024-03-31 NOTE — Discharge Instructions (Addendum)
 It does appear you have a UTI. Please take the antibiotics as prescribed.   If you have worsening fatigue/fever then please come back to the ED for further evaluation  Please follow up with your primary care doctor to make sure your urine clears   You had a CT scan and ultrasound of your abdomen today, which showed changes to your gallbladder.  Get rechecked if you have fevers or abdominal pain.

## 2024-04-01 ENCOUNTER — Emergency Department (HOSPITAL_COMMUNITY)

## 2024-04-01 MED ORDER — SODIUM CHLORIDE 0.9 % IV SOLN: 1.0000 g | Freq: Once | INTRAVENOUS | Status: DC

## 2024-04-03 LAB — URINE CULTURE: Culture: >=100000 — AB

## 2024-04-04 ENCOUNTER — Telehealth (HOSPITAL_BASED_OUTPATIENT_CLINIC_OR_DEPARTMENT_OTHER): Payer: Self-pay

## 2024-04-04 NOTE — Telephone Encounter (Signed)
 Post ED Visit - Positive Culture Follow-up  Culture report reviewed by antimicrobial stewardship pharmacist: [facility] []  Rankin Dee, Pharm.D. []  Venetia Gully, Pharm.D., BCPS AQ-ID []  Garrel Crews, Pharm.D., BCPS [x]  Almarie Lunger, Pharm.D., BCPS []  [place], [address].D., BCPS, AAHIVP []  Rosaline Bihari, Pharm.D., BCPS, AAHIVP []  Vernell Meier, PharmD, BCPS []  Latanya Hint, PharmD, BCPS []  Donald Medley, PharmD, BCPS []  Rocky Bold, PharmD []  Dorothyann Alert, PharmD, BCPS []  Morene Babe, PharmD  [facility] []  Rosaline Edison, PharmD []  Romona Bliss, PharmD []  Dolphus Roller, PharmD []  Veva Seip, Rph []  Vernell Daunt) Leonce, PharmD []  Eva Allis, PharmD []  Rosaline Millet, PharmD []  Iantha Batch, PharmD []  Arvin Gauss, PharmD []  Wanda Hasting, PharmD []  Ronal Rav, PharmD []  Rocky Slade, PharmD []  Bard Jeans, PharmD   Positive urine culture Treated with Cefpodoxime  Proxetil, organism sensitive to the same and no further patient follow-up is required at this time.  Ruth Camelia Elbe 04/04/2024, 9:25 AM

## 2024-04-16 ENCOUNTER — Emergency Department (HOSPITAL_COMMUNITY)

## 2024-04-16 ENCOUNTER — Emergency Department (HOSPITAL_COMMUNITY)
Admission: EM | Admit: 2024-04-16 | Discharge: 2024-04-17 | Disposition: A | Discharge status: Home / Self Care | Attending: Emergency Medicine | Admitting: Emergency Medicine

## 2024-04-16 ENCOUNTER — Encounter (HOSPITAL_COMMUNITY): Payer: Self-pay

## 2024-04-16 ENCOUNTER — Other Ambulatory Visit: Payer: Self-pay

## 2024-04-16 DIAGNOSIS — E119 Type 2 diabetes mellitus without complications: Secondary | ICD-10-CM | POA: Insufficient documentation

## 2024-04-16 DIAGNOSIS — R1010 Upper abdominal pain, unspecified: Secondary | ICD-10-CM | POA: Insufficient documentation

## 2024-04-16 DIAGNOSIS — I5022 Chronic systolic (congestive) heart failure: Secondary | ICD-10-CM | POA: Diagnosis not present

## 2024-04-16 DIAGNOSIS — Z7901 Long term (current) use of anticoagulants: Secondary | ICD-10-CM | POA: Diagnosis not present

## 2024-04-16 DIAGNOSIS — R600 Localized edema: Secondary | ICD-10-CM | POA: Insufficient documentation

## 2024-04-16 DIAGNOSIS — Z79899 Other long term (current) drug therapy: Secondary | ICD-10-CM | POA: Insufficient documentation

## 2024-04-16 DIAGNOSIS — J45909 Unspecified asthma, uncomplicated: Secondary | ICD-10-CM | POA: Insufficient documentation

## 2024-04-16 DIAGNOSIS — I110 Hypertensive heart disease with heart failure: Secondary | ICD-10-CM | POA: Insufficient documentation

## 2024-04-16 DIAGNOSIS — N3289 Other specified disorders of bladder: Secondary | ICD-10-CM

## 2024-04-16 LAB — URINALYSIS, ROUTINE W REFLEX MICROSCOPIC
Bacteria, UA: NONE SEEN
Bilirubin Urine: NEGATIVE
Glucose, UA: NEGATIVE mg/dL
Hgb urine dipstick: NEGATIVE
Ketones, ur: NEGATIVE mg/dL
Nitrite: NEGATIVE
Protein, ur: 100 mg/dL — AB
Specific Gravity, Urine: 1.016 (ref 1.005–1.030)
pH: 5 (ref 5.0–8.0)

## 2024-04-16 LAB — COMPREHENSIVE METABOLIC PANEL WITH GFR
ALT: 23 U/L (ref 0–44)
AST: 27 U/L (ref 15–41)
Albumin: 3.8 g/dL (ref 3.5–5.0)
Alkaline Phosphatase: 80 U/L (ref 38–126)
Anion gap: 11 (ref 5–15)
BUN: 36 mg/dL — ABNORMAL HIGH (ref 8–23)
CO2: 25 mmol/L (ref 22–32)
Calcium: 10 mg/dL (ref 8.9–10.3)
Chloride: 102 mmol/L (ref 98–111)
Creatinine, Ser: 1.59 mg/dL — ABNORMAL HIGH (ref 0.61–1.24)
GFR, Estimated: 45 mL/min — ABNORMAL LOW (ref >60)
Glucose, Bld: 125 mg/dL — ABNORMAL HIGH (ref 70–99)
Potassium: 3.9 mmol/L (ref 3.5–5.1)
Sodium: 138 mmol/L (ref 135–145)
Total Bilirubin: 1.6 mg/dL — ABNORMAL HIGH (ref 0.0–1.2)
Total Protein: 7.6 g/dL (ref 6.5–8.1)

## 2024-04-16 LAB — CBC WITH DIFFERENTIAL/PLATELET
Abs Immature Granulocytes: 0.05 K/uL (ref 0.00–0.07)
Basophils Absolute: 0.1 K/uL (ref 0.0–0.1)
Basophils Relative: 1 %
Eosinophils Absolute: 0.3 K/uL (ref 0.0–0.5)
Eosinophils Relative: 3 %
HCT: 37.4 % — ABNORMAL LOW (ref 39.0–52.0)
Hemoglobin: 11.2 g/dL — ABNORMAL LOW (ref 13.0–17.0)
Immature Granulocytes: 1 %
Lymphocytes Relative: 15 %
Lymphs Abs: 1.3 K/uL (ref 0.7–4.0)
MCH: 27.7 pg (ref 26.0–34.0)
MCHC: 29.9 g/dL — ABNORMAL LOW (ref 30.0–36.0)
MCV: 92.3 fL (ref 80.0–100.0)
Monocytes Absolute: 0.8 K/uL (ref 0.1–1.0)
Monocytes Relative: 10 %
Neutro Abs: 5.9 K/uL (ref 1.7–7.7)
Neutrophils Relative %: 70 %
Platelets: 170 K/uL (ref 150–400)
RBC: 4.05 MIL/uL — ABNORMAL LOW (ref 4.22–5.81)
RDW: 16.5 % — ABNORMAL HIGH (ref 11.5–15.5)
WBC: 8.3 K/uL (ref 4.0–10.5)
nRBC: 0 % (ref 0.0–0.2)

## 2024-04-16 LAB — LIPASE, BLOOD: Lipase: 28 U/L (ref 11–51)

## 2024-04-16 MED ORDER — KETOROLAC TROMETHAMINE 30 MG/ML IJ SOLN: 15.0000 mg | Freq: Once | INTRAMUSCULAR | Status: DC

## 2024-04-16 NOTE — ED Notes (Signed)
 Attempted IV access x2. No success. Asked for staff that can do US  Ivs's. Dean [name] in room at this time to look to see if she is able to get a regular Peripheral IV

## 2024-04-16 NOTE — ED Triage Notes (Signed)
 Pt reports with upper abdominal pain. Pt recently finished up abts for a uti.

## 2024-04-17 ENCOUNTER — Emergency Department (HOSPITAL_COMMUNITY)

## 2024-04-17 DIAGNOSIS — I509 Heart failure, unspecified: Secondary | ICD-10-CM | POA: Diagnosis not present

## 2024-04-17 DIAGNOSIS — I130 Hypertensive heart and chronic kidney disease with heart failure and stage 1 through stage 4 chronic kidney disease, or unspecified chronic kidney disease: Secondary | ICD-10-CM | POA: Diagnosis not present

## 2024-04-17 LAB — BRAIN NATRIURETIC PEPTIDE: B Natriuretic Peptide: 1463.6 pg/mL — ABNORMAL HIGH (ref 0.0–100.0)

## 2024-04-17 MED ORDER — FUROSEMIDE 10 MG/ML IJ SOLN
40.0000 mg | Freq: Once | INTRAMUSCULAR | Status: AC
  Administered 2024-04-17: 40 mg via INTRAVENOUS
  Filled 2024-04-17: qty 4

## 2024-04-17 NOTE — ED Notes (Signed)
 Consult to Cardiology per EDP, Palumbo,MD.

## 2024-04-17 NOTE — ED Provider Notes (Signed)
 [place] [facility] AT [facility] Provider Note   CSN: [id number] Arrival date & time: 04/16/24  1706     Patient presents with: Abdominal Pain   Elis Sauber Sr. is a 73 y.o. male.   The history is provided by the patient.  Abdominal Pain Pain location: upper, stated it feels full as if his urinary bladder is up in the upper abdomen.  felt this way with UTI and he is just finsishing antibiotics for this. Pain quality: pressure   Pain radiates to:  Does not radiate Pain severity:  Moderate Onset quality:  Gradual Timing:  Constant Progression:  Unchanged Chronicity:  Recurrent Context: recent illness   Context: not sick contacts and not trauma   Associated symptoms: no chest pain, no fever and no shortness of breath   Patient with CHF presents with abdominal pain and distention and feels like bladder is enlarged like UTI.  No fever, no nausea vomiting diarrhea.      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 09/2016 s/p MDT Visia AF MRI single lead ICD (ser # [id number]).   Arthritis    maybe in his spine (09/05/2016)   Asthma    pt. denies   Cardiac arrest (HCC)    a. 08/2016 VT/VF Arrest-->09/2016 s/p MDT Visia AF MRI single lead ICD (ser # [id number]).   Chronic HFrEF (heart failure with reduced ejection fraction) (HCC)    a. 08/2016 Echo: EF 35-40%; b. 04/2018 Echo: EF 30-35%; c. 04/2020 Echo: EF 30-35%; d. 11/2021 Echo: EF 40-45%, GrI DD, nl RV fxn, sev dil LA, mild-mod MR, mild-mod AI, Ao root 38mm.   Coronary artery disease    a. 08/2016 VT Arrest/Cath: LM nl, LAD nl, LCX nl, RGCA 90p, 100d w/ L->R collats, EF 35-45%.   Gout    Hyperlipidemia LDL goal <70    Hypertension    Ischemic cardiomyopathy    a. 08/2016 Echo: EF 35-40%; b. 04/2018 Echo: EF 30-35%; c. 04/2020 Echo: EF 30-35%; d. 11/2021 Echo: EF 40-45%.   LBBB (left bundle branch block)    OSA on CPAP    does not  wear CPAP   PAF (paroxysmal atrial fibrillation) (HCC)    a. CHA2DS2VASc = 5-->Eliquis .   Type II diabetes mellitus (HCC)      Prior to Admission medications   Medication Sig Start Date End Date Taking? Authorizing Provider  allopurinol  (ZYLOPRIM ) 300 MG tablet Take 300 mg by mouth daily. 06/13/23   [provider]  amiodarone  (PACERONE ) 200 MG tablet Take 1 tablet (200 mg total) by mouth daily. 09/06/22   Leverne Charlies Helling, PA-C  atorvastatin  (LIPITOR) 20 MG tablet Take 1 tablet (20 mg total) by mouth daily. 02/06/24 05/06/24  Mona Vinie BROCKS, MD  carvedilol  (COREG ) 12.5 MG tablet TAKE 1 TABLET (12.5MG  TOTAL) BY MOUTH TWICE A DAY WITH MEALS 08/28/23   Leverne Charlies Helling, PA-C  cholecalciferol  (VITAMIN D3) 25 MCG (1000 UNIT) tablet Take 1,000 Units by mouth daily. 06/13/23   [provider]  ELIQUIS  5 MG TABS tablet TAKE 1 TABLET BY MOUTH TWICE A DAY 04/20/23   Camnitz, Soyla Lunger, MD  ferrous sulfate 325 (65 FE) MG tablet Take 325 mg by mouth daily. 11/08/23   [provider]  furosemide  (LASIX ) 40 MG tablet Take 1 tablet (40 mg total) by mouth daily. 12/12/23   Vicci Rollo SAUNDERS, PA-C  magnesium  oxide (MAG-OX) 400 (240 Mg) MG tablet TAKE  1 TABLET BY MOUTH EVERY DAY Patient not taking: Reported on 02/06/2024 09/06/22   Mona Vinie BROCKS, MD  valsartan  (DIOVAN ) 40 MG tablet Take 1 tablet (40 mg total) by mouth daily. 11/27/23   Vicci Rollo SAUNDERS, PA-C    Allergies: Contrast media [iodinated contrast media] and Lisinopril     Review of Systems  Constitutional:  Negative for fever.  Eyes:  Negative for photophobia and visual disturbance.  Respiratory:  Negative for shortness of breath, wheezing and stridor.   Cardiovascular:  Negative for chest pain.  Gastrointestinal:  Positive for abdominal pain.  All other systems reviewed and are negative.   Updated Vital Signs BP 133/75   Pulse 64   Temp 98.5 F (36.9 C) (Oral)   Resp 19   SpO2 96%   Physical  Exam Vitals and nursing note reviewed.  Constitutional:      General: He is not in acute distress.    Appearance: Normal appearance. He is well-developed. He is not diaphoretic.  HENT:     Head: Normocephalic and atraumatic.     Nose: Nose normal.  Eyes:     Conjunctiva/sclera: Conjunctivae normal.     Pupils: Pupils are equal, round, and reactive to light.  Cardiovascular:     Rate and Rhythm: Normal rate and regular rhythm.     Pulses: Normal pulses.     Heart sounds: Normal heart sounds.  Pulmonary:     Effort: Pulmonary effort is normal. No respiratory distress.     Breath sounds: Normal breath sounds. No stridor. No wheezing, rhonchi or rales.  Abdominal:     General: Bowel sounds are normal.     Palpations: Abdomen is soft.     Tenderness: There is no abdominal tenderness. There is no guarding or rebound. Negative signs include Murphy's sign and McBurney's sign.     Hernia: No hernia is present.  Musculoskeletal:        General: Normal range of motion.     Cervical back: Normal range of motion and neck supple.     Right lower leg: Edema present.     Left lower leg: Edema present.     Comments: To the knees, no erythema   Skin:    General: Skin is warm and dry.     Capillary Refill: Capillary refill takes less than 2 seconds.  Neurological:     General: No focal deficit present.     Mental Status: He is alert and oriented to person, place, and time.     Deep Tendon Reflexes: Reflexes normal.     (all labs ordered are listed, but only abnormal results are displayed) Results for orders placed or performed during the hospital encounter of 04/16/24  Comprehensive metabolic panel   Collection Time: 04/16/24 10:52 PM  Result Value Ref Range   Sodium 138 135 - 145 mmol/L   Potassium 3.9 3.5 - 5.1 mmol/L   Chloride 102 98 - 111 mmol/L   CO2 25 22 - 32 mmol/L   Glucose, Bld 125 (H) 70 - 99 mg/dL   BUN 36 (H) 8 - 23 mg/dL   Creatinine, Ser 8.40 (H) 0.61 - 1.24 mg/dL    Calcium  10.0 8.9 - 10.3 mg/dL   Total Protein 7.6 6.5 - 8.1 g/dL   Albumin  3.8 3.5 - 5.0 g/dL   AST 27 15 - 41 U/L   ALT 23 0 - 44 U/L   Alkaline Phosphatase 80 38 - 126 U/L   Total Bilirubin 1.6 (H) 0.0 -  1.2 mg/dL   GFR, Estimated 45 (L) >60 mL/min   Anion gap 11 5 - 15  Lipase, blood   Collection Time: 04/16/24 10:52 PM  Result Value Ref Range   Lipase 28 11 - 51 U/L  CBC with Diff   Collection Time: 04/16/24 10:52 PM  Result Value Ref Range   WBC 8.3 4.0 - 10.5 K/uL   RBC 4.05 (L) 4.22 - 5.81 MIL/uL   Hemoglobin 11.2 (L) 13.0 - 17.0 g/dL   HCT 62.5 (L) 60.9 - 47.9 %   MCV 92.3 80.0 - 100.0 fL   MCH 27.7 26.0 - 34.0 pg   MCHC 29.9 (L) 30.0 - 36.0 g/dL   RDW 83.4 (H) 88.4 - 84.4 %   Platelets 170 150 - 400 K/uL   nRBC 0.0 0.0 - 0.2 %   Neutrophils Relative % 70 %   Neutro Abs 5.9 1.7 - 7.7 K/uL   Lymphocytes Relative 15 %   Lymphs Abs 1.3 0.7 - 4.0 K/uL   Monocytes Relative 10 %   Monocytes Absolute 0.8 0.1 - 1.0 K/uL   Eosinophils Relative 3 %   Eosinophils Absolute 0.3 0.0 - 0.5 K/uL   Basophils Relative 1 %   Basophils Absolute 0.1 0.0 - 0.1 K/uL   Immature Granulocytes 1 %   Abs Immature Granulocytes 0.05 0.00 - 0.07 K/uL  Urinalysis, Routine w reflex microscopic -Urine, Clean Catch   Collection Time: 04/16/24 11:14 PM  Result Value Ref Range   Color, Urine YELLOW YELLOW   APPearance CLEAR CLEAR   Specific Gravity, Urine 1.016 1.005 - 1.030   pH 5.0 5.0 - 8.0   Glucose, UA NEGATIVE NEGATIVE mg/dL   Hgb urine dipstick NEGATIVE NEGATIVE   Bilirubin Urine NEGATIVE NEGATIVE   Ketones, ur NEGATIVE NEGATIVE mg/dL   Protein, ur 899 (A) NEGATIVE mg/dL   Nitrite NEGATIVE NEGATIVE   Leukocytes,Ua TRACE (A) NEGATIVE   RBC / HPF 0-5 0 - 5 RBC/hpf   WBC, UA 6-10 0 - 5 WBC/hpf   Bacteria, UA NONE SEEN NONE SEEN   Squamous Epithelial / HPF 0-5 0 - 5 /HPF   Mucus PRESENT    Hyaline Casts, UA PRESENT   Brain natriuretic peptide   Collection Time: 04/17/24  3:37 AM   Result Value Ref Range   B Natriuretic Peptide 1,463.6 (H) 0.0 - 100.0 pg/mL   DG Chest Portable 1 View Result Date: 04/17/2024 EXAM: 1 VIEW XRAY OF THE CHEST 04/17/2024 03:20:00 AM COMPARISON: 03/31/2024 CLINICAL HISTORY: CHF. Legs and feet swollen. FINDINGS: LUNGS AND PLEURA: No frank interstitial edema. No pleural effusion. No pneumothorax. HEART AND MEDIASTINUM: Cardiomegaly. BONES AND SOFT TISSUES: No acute osseous abnormality. Left subclavian ICD in place. IMPRESSION: 1. Cardiomegaly. 2. No frank interstitial edema. Electronically signed by: Pinkie Pebbles MD 04/17/2024 03:25 AM EDT RP Workstation: [id number]   US  Abdomen Limited Result Date: 04/17/2024 EXAM: Right Upper Quadrant Abdominal Ultrasound 04/17/2024 02:27:06 AM TECHNIQUE: Real-time ultrasonography of the right upper quadrant of the abdomen was performed. COMPARISON: CT abdomen and pelvis earlier today. CLINICAL HISTORY: Cholelithiasis. FINDINGS: LIVER: Multiple echogenic hepatic lesions measuring up to 4.6 cm, previously characterized as benign hemangiomas on MR. BILIARY SYSTEM: Thick walled, underdistended gallbladder with cholesterolosis, suggesting gallbladder adenomyomatosis. Layering gallstones measuring up to 11 mm. Negative sonographic Murphy's sign. Common duct measures 4 mm. RIGHT KIDNEY: The right kidney is grossly unremarkable in appearances without evidence of hydronephrosis, echogenic calculi or worrisome mass lesions. PANCREAS: Visualized portions of the pancreas are unremarkable.  OTHER: No right upper quadrant ascites. IMPRESSION: 1. Cholelithiasis, without convincing sonographic findings to suggest acute cholecystitis. 2. 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 3. Benign hepatic hemangiomas, better evaluated on prior MR. Electronically signed by: Pinkie Pebbles MD 04/17/2024 02:35 AM EDT RP Workstation: [id number]   CT Renal Stone Study Result Date: 04/17/2024 CLINICAL DATA:  Upper abdominal pain. EXAM: CT ABDOMEN AND  PELVIS WITHOUT CONTRAST TECHNIQUE: Multidetector CT imaging of the abdomen and pelvis was performed following the standard protocol without IV contrast. RADIATION DOSE REDUCTION: This exam was performed according to the departmental dose-optimization program which includes automated exposure control, adjustment of the mA and/or kV according to patient size and/or use of iterative reconstruction technique. COMPARISON:  April 01, 2024 FINDINGS: Lower chest: There is mild cardiomegaly. A small right pleural effusion is seen. Hepatobiliary: No focal liver abnormality is seen. A few subcentimeter gallstones are seen. Mild gallbladder wall thickening is suspected. There is no evidence of biliary dilatation. Pancreas: Unremarkable. No pancreatic ductal dilatation or surrounding inflammatory changes. Spleen: Normal in size without focal abnormality. Adrenals/Urinary Tract: Adrenal glands are unremarkable. Kidneys are normal in size, without renal calculi or hydronephrosis. A small stable subcentimeter renal cyst is seen within the lower pole of the left kidney. The urinary bladder is poorly distended and subsequently limited in evaluation. Moderate severity diffuse urinary bladder wall thickening is noted. Stomach/Bowel: Stomach is within normal limits. Appendix appears normal. No evidence of bowel wall thickening, distention, or inflammatory changes. Noninflamed diverticula are seen throughout the descending and sigmoid colon. Vascular/Lymphatic: Aortic atherosclerosis. No enlarged abdominal or pelvic lymph nodes. Reproductive: The prostate gland is mildly enlarged with moderate to marked severity prostate gland calcifications. Other: Moderate severity anasarca is seen throughout the abdominal and pelvic walls. A mild amount of free fluid is seen within the left upper quadrant. Musculoskeletal: Multilevel degenerative changes are present throughout the lumbar spine. IMPRESSION: 1. Small right pleural effusion. 2.  Cholelithiasis with mild gallbladder wall thickening. Further evaluation with right upper quadrant ultrasound is recommended if acute cholecystitis is of clinical concern. 3. Colonic diverticulosis. 4. Moderate severity diffuse urinary bladder wall thickening which may be secondary to cystitis. Correlation with urinalysis is recommended. 5. Mild amount of left upper quadrant free fluid. 6. Moderate severity anasarca. 7. Aortic atherosclerosis. Electronically Signed   By: Suzen Dials M.D.   On: 04/17/2024 00:27   US  Abdomen Limited RUQ (LIVER/GB) Result Date: 04/01/2024 CLINICAL DATA:  Right upper quadrant abdominal pain for 2 days. EXAM: ULTRASOUND ABDOMEN LIMITED RIGHT UPPER QUADRANT COMPARISON:  CT abdomen and pelvis 04/01/2024 FINDINGS: Gallbladder: Cholelithiasis with stones measuring up to 1.2 cm. Gallbladder wall thickening with pericholecystic edema. Murphy's sign is negative. Small non dependent filling defect in the gallbladder wall measuring 3 mm diameter with ring down artifact. This is likely a small polyp or cholesterolosis. No imaging follow-up for this lesion is indicated. Common bile duct: Diameter: 3.3 mm, normal Liver: Multiple focal liver lesions are identified including a right lobe lesion measuring 6.1 cm diameter, a left lobe lesion measuring 4.4 cm diameter, another left lobe lesion measuring 2.5 cm diameter. Lesions are well-circumscribed and homogeneously hyperechoic consistent with ultrasound appearance of cavernous hemangiomas. These lesions have been previously evaluated at MRI with findings consistent with cavernous hemangiomas. Portal vein is patent on color Doppler imaging with normal direction of blood flow towards the liver. Other: None. IMPRESSION: 1. Cholelithiasis with gallbladder wall thickening and pericholecystic edema. Murphy's sign is negative. 2. 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  Electronically Signed   By: Elsie Gravely  M.D.   On: 04/01/2024 02:43   CT ABDOMEN PELVIS WO CONTRAST Result Date: 04/01/2024 CLINICAL DATA:  Weakness. Possible free air on chest radiograph. History of CHF. EXAM: CT ABDOMEN AND PELVIS WITHOUT CONTRAST TECHNIQUE: Multidetector CT imaging of the abdomen and pelvis was performed following the standard protocol without IV contrast. RADIATION DOSE REDUCTION: This exam was performed according to the departmental dose-optimization program which includes automated exposure control, adjustment of the mA and/or kV according to patient size and/or use of iterative reconstruction technique. COMPARISON:  Chest radiograph 03/31/2024. CT chest abdomen pelvis 06/29/2020 FINDINGS: Lower chest: Mild degenerative changes in the lung bases. Cardiac enlargement. Hepatobiliary: Liver is enlarged. Poorly visualized mildly low-attenuation lesions in the liver including a right lobe lesion measuring about 8.8 cm diameter and a lateral segment left lobe lesion measuring about 5.4 cm diameter. These lesions have been present previously and or evaluated MRI on 09/19/2018 with findings consistent with cavernous hemangiomas. Cholelithiasis with small stones in the gallbladder. Gallbladder wall thickening is suggested. Changes could indicate acute cholecystitis in the appropriate clinical setting. No bile duct dilatation. Pancreas: Unremarkable. No pancreatic ductal dilatation or surrounding inflammatory changes. Spleen: Normal in size without focal abnormality. Adrenals/Urinary Tract: Adrenal glands are unremarkable. Kidneys are normal, without renal calculi, focal lesion, or hydronephrosis. Bladder wall is mildly thickened. This may be due to outlet obstruction or cystitis. Correlate with urinalysis. Stomach/Bowel: Stomach, small bowel, and colon are not abnormally distended. No wall thickening or inflammatory changes. Sigmoid colonic diverticulosis without evidence of acute diverticulitis. Appendix is normal. Vascular/Lymphatic:  Calcification of the aorta. No aneurysm. No significant lymphadenopathy. Reproductive: Prostate gland is not enlarged. Other: No free air or free fluid in the abdomen. The finding of gas in the right upper quadrant on prior chest radiograph results from colonic interposition anterior to the liver. Musculoskeletal: Degenerative changes in the spine and hips. No acute bony abnormalities. IMPRESSION: 1. No free air in the abdomen. Finding gas in the right upper quadrant on prior chest radiograph results from colonic interposition anterior to the liver. 2. Cholelithiasis with gallbladder wall thickening and edema. This may indicate acute cholecystitis in the appropriate clinical setting. 3. Stable appearance of liver lesions, previously demonstrated to represent cavernous hemangiomas. These are not well demonstrated on noncontrast imaging. 4. Aortic atherosclerosis. 5. Colonic diverticulosis without evidence of acute diverticulitis. 6. Bladder wall is mildly thickened, possibly due to outlet obstruction or cystitis. Correlate with urinalysis. 7. Cardiac enlargement. Electronically Signed   By: Elsie Gravely M.D.   On: 04/01/2024 01:33   DG Chest 2 View Result Date: 03/31/2024 CLINICAL DATA:  Generalized weakness EXAM: CHEST - 2 VIEW COMPARISON:  CT 06/29/2020 FINDINGS: Left-sided single lead pacing device. Cardiomegaly with vascular congestion and probable small right effusion. Aortic atherosclerosis. No pneumothorax. Tubular air containing structure beneath the right diaphragm with air containing bowel in the right upper quadrant. IMPRESSION: 1. Cardiomegaly with vascular congestion and probable small right effusion. 2. Tubular air containing structure beneath the right diaphragm, suspect air-filled high-riding loop of bowel but given change in appearance compared to prior radiographs, free air not excluded and suggest CT for further evaluation. Electronically Signed   By: Luke Bun M.D.   On: 03/31/2024 17:19     None  Radiology: DG Chest Portable 1 View Result Date: 04/17/2024 EXAM: 1 VIEW XRAY OF THE CHEST 04/17/2024 03:20:00 AM COMPARISON: 03/31/2024 CLINICAL HISTORY: CHF. Legs and feet swollen. FINDINGS: LUNGS AND PLEURA: No  frank interstitial edema. No pleural effusion. No pneumothorax. HEART AND MEDIASTINUM: Cardiomegaly. BONES AND SOFT TISSUES: No acute osseous abnormality. Left subclavian ICD in place. IMPRESSION: 1. Cardiomegaly. 2. No frank interstitial edema. Electronically signed by: Pinkie Pebbles MD 04/17/2024 03:25 AM EDT RP Workstation: [id number]   US  Abdomen Limited Result Date: 04/17/2024 EXAM: Right Upper Quadrant Abdominal Ultrasound 04/17/2024 02:27:06 AM TECHNIQUE: Real-time ultrasonography of the right upper quadrant of the abdomen was performed. COMPARISON: CT abdomen and pelvis earlier today. CLINICAL HISTORY: Cholelithiasis. FINDINGS: LIVER: Multiple echogenic hepatic lesions measuring up to 4.6 cm, previously characterized as benign hemangiomas on MR. BILIARY SYSTEM: Thick walled, underdistended gallbladder with cholesterolosis, suggesting gallbladder adenomyomatosis. Layering gallstones measuring up to 11 mm. Negative sonographic Murphy's sign. Common duct measures 4 mm. RIGHT KIDNEY: The right kidney is grossly unremarkable in appearances without evidence of hydronephrosis, echogenic calculi or worrisome mass lesions. PANCREAS: Visualized portions of the pancreas are unremarkable. OTHER: No right upper quadrant ascites. IMPRESSION: 1. Cholelithiasis, without convincing sonographic findings to suggest acute cholecystitis. 2. 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 3. Benign hepatic hemangiomas, better evaluated on prior MR. Electronically signed by: Pinkie Pebbles MD 04/17/2024 02:35 AM EDT RP Workstation: [id number]   CT Renal Stone Study Result Date: 04/17/2024 CLINICAL DATA:  Upper abdominal pain. EXAM: CT ABDOMEN AND PELVIS WITHOUT CONTRAST TECHNIQUE: Multidetector CT  imaging of the abdomen and pelvis was performed following the standard protocol without IV contrast. RADIATION DOSE REDUCTION: This exam was performed according to the departmental dose-optimization program which includes automated exposure control, adjustment of the mA and/or kV according to patient size and/or use of iterative reconstruction technique. COMPARISON:  April 01, 2024 FINDINGS: Lower chest: There is mild cardiomegaly. A small right pleural effusion is seen. Hepatobiliary: No focal liver abnormality is seen. A few subcentimeter gallstones are seen. Mild gallbladder wall thickening is suspected. There is no evidence of biliary dilatation. Pancreas: Unremarkable. No pancreatic ductal dilatation or surrounding inflammatory changes. Spleen: Normal in size without focal abnormality. Adrenals/Urinary Tract: Adrenal glands are unremarkable. Kidneys are normal in size, without renal calculi or hydronephrosis. A small stable subcentimeter renal cyst is seen within the lower pole of the left kidney. The urinary bladder is poorly distended and subsequently limited in evaluation. Moderate severity diffuse urinary bladder wall thickening is noted. Stomach/Bowel: Stomach is within normal limits. Appendix appears normal. No evidence of bowel wall thickening, distention, or inflammatory changes. Noninflamed diverticula are seen throughout the descending and sigmoid colon. Vascular/Lymphatic: Aortic atherosclerosis. No enlarged abdominal or pelvic lymph nodes. Reproductive: The prostate gland is mildly enlarged with moderate to marked severity prostate gland calcifications. Other: Moderate severity anasarca is seen throughout the abdominal and pelvic walls. A mild amount of free fluid is seen within the left upper quadrant. Musculoskeletal: Multilevel degenerative changes are present throughout the lumbar spine. IMPRESSION: 1. Small right pleural effusion. 2. Cholelithiasis with mild gallbladder wall thickening. Further  evaluation with right upper quadrant ultrasound is recommended if acute cholecystitis is of clinical concern. 3. Colonic diverticulosis. 4. Moderate severity diffuse urinary bladder wall thickening which may be secondary to cystitis. Correlation with urinalysis is recommended. 5. Mild amount of left upper quadrant free fluid. 6. Moderate severity anasarca. 7. Aortic atherosclerosis. Electronically Signed   By: Suzen Dials M.D.   On: 04/17/2024 00:27     Procedures   Medications Ordered in the ED  furosemide  (LASIX ) injection 40 mg (40 mg Intravenous Given 04/17/24 0336)  Medical Decision Making Patient with abdominal bloating and  Amount and/or Complexity of Data Reviewed Labs: ordered.    Details: Normal sodium 138, normal potassium 3.9, creatinine slight elevation 1.59. Normal AST and ALT.  Lipase is normal 28. Urine is negative for UTI.  Normal white count 8.3, hemoglobin 11.2, normal platelets  Radiology: ordered and independent interpretation performed.    Details: No SBO on CT. Discussion of management or test interpretation with external provider(s): 5:18 AM case d/w Dr. Duffy of cardiology.  Has had issues with higher doses of lasix  so do not adjust at this time.     Risk Prescription drug management. Risk Details: Case d/w cardiology.  Will call for close follow up to make medication adjustments.  Patient doesn't have RUQ tenderness. FNo cholecystitis on US .  Fluid is likely from abdominal ascites.  No LFT abnormalities, no white count.  Patient does not have a UTI at this time.  He does still have bladder thickening which will need further investigation by urology.  I have referred the patient to [facility] for ongoing testing and treatment.       Final diagnoses:  Bilateral lower extremity edema   . No signs of systemic illness or infection. The patient is nontoxic-appearing on exam and vital signs are within normal limits.  I  have reviewed the triage vital signs and the nursing notes. Pertinent labs & imaging results that were available during my care of the patient were reviewed by me and considered in my medical decision making (see chart for details). After history, exam, and medical workup I feel the patient has been appropriately medically screened and is safe for discharge home. Pertinent diagnoses were discussed with the patient. Patient was given return precautions.  ED Discharge Orders     None          Kinzley Savell, MD 04/17/24 [id number]

## 2024-04-20 ENCOUNTER — Other Ambulatory Visit: Payer: Self-pay

## 2024-04-20 ENCOUNTER — Emergency Department (HOSPITAL_COMMUNITY)

## 2024-04-20 ENCOUNTER — Encounter (HOSPITAL_COMMUNITY): Payer: Self-pay

## 2024-04-20 ENCOUNTER — Inpatient Hospital Stay (HOSPITAL_COMMUNITY)
Admission: EM | Admit: 2024-04-20 | Discharge: 2024-04-22 | DRG: 291 | Disposition: A | Discharge status: Home / Self Care | Attending: Internal Medicine | Admitting: Internal Medicine

## 2024-04-20 DIAGNOSIS — I2510 Atherosclerotic heart disease of native coronary artery without angina pectoris: Secondary | ICD-10-CM | POA: Diagnosis present

## 2024-04-20 DIAGNOSIS — K5900 Constipation, unspecified: Secondary | ICD-10-CM | POA: Diagnosis present

## 2024-04-20 DIAGNOSIS — Z8674 Personal history of sudden cardiac arrest: Secondary | ICD-10-CM | POA: Diagnosis not present

## 2024-04-20 DIAGNOSIS — Z79899 Other long term (current) drug therapy: Secondary | ICD-10-CM

## 2024-04-20 DIAGNOSIS — N1832 Chronic kidney disease, stage 3b: Secondary | ICD-10-CM | POA: Diagnosis present

## 2024-04-20 DIAGNOSIS — R531 Weakness: Secondary | ICD-10-CM | POA: Diagnosis present

## 2024-04-20 DIAGNOSIS — I447 Left bundle-branch block, unspecified: Secondary | ICD-10-CM | POA: Diagnosis present

## 2024-04-20 DIAGNOSIS — Z7901 Long term (current) use of anticoagulants: Secondary | ICD-10-CM

## 2024-04-20 DIAGNOSIS — I255 Ischemic cardiomyopathy: Secondary | ICD-10-CM | POA: Diagnosis present

## 2024-04-20 DIAGNOSIS — N179 Acute kidney failure, unspecified: Secondary | ICD-10-CM | POA: Diagnosis present

## 2024-04-20 DIAGNOSIS — E669 Obesity, unspecified: Secondary | ICD-10-CM | POA: Diagnosis present

## 2024-04-20 DIAGNOSIS — I5043 Acute on chronic combined systolic (congestive) and diastolic (congestive) heart failure: Secondary | ICD-10-CM | POA: Diagnosis present

## 2024-04-20 DIAGNOSIS — I509 Heart failure, unspecified: Principal | ICD-10-CM

## 2024-04-20 DIAGNOSIS — G4733 Obstructive sleep apnea (adult) (pediatric): Secondary | ICD-10-CM | POA: Diagnosis present

## 2024-04-20 DIAGNOSIS — Z91041 Radiographic dye allergy status: Secondary | ICD-10-CM | POA: Diagnosis not present

## 2024-04-20 DIAGNOSIS — Z6831 Body mass index (BMI) 31.0-31.9, adult: Secondary | ICD-10-CM

## 2024-04-20 DIAGNOSIS — Z8249 Family history of ischemic heart disease and other diseases of the circulatory system: Secondary | ICD-10-CM

## 2024-04-20 DIAGNOSIS — I130 Hypertensive heart and chronic kidney disease with heart failure and stage 1 through stage 4 chronic kidney disease, or unspecified chronic kidney disease: Secondary | ICD-10-CM | POA: Diagnosis present

## 2024-04-20 DIAGNOSIS — I5023 Acute on chronic systolic (congestive) heart failure: Secondary | ICD-10-CM

## 2024-04-20 DIAGNOSIS — Z888 Allergy status to other drugs, medicaments and biological substances status: Secondary | ICD-10-CM | POA: Diagnosis not present

## 2024-04-20 DIAGNOSIS — Z1152 Encounter for screening for COVID-19: Secondary | ICD-10-CM

## 2024-04-20 DIAGNOSIS — E1122 Type 2 diabetes mellitus with diabetic chronic kidney disease: Secondary | ICD-10-CM | POA: Diagnosis present

## 2024-04-20 DIAGNOSIS — Z95810 Presence of automatic (implantable) cardiac defibrillator: Secondary | ICD-10-CM | POA: Diagnosis not present

## 2024-04-20 DIAGNOSIS — E785 Hyperlipidemia, unspecified: Secondary | ICD-10-CM | POA: Diagnosis present

## 2024-04-20 DIAGNOSIS — I480 Paroxysmal atrial fibrillation: Secondary | ICD-10-CM | POA: Diagnosis present

## 2024-04-20 LAB — CBC WITH DIFFERENTIAL/PLATELET
Abs Immature Granulocytes: 0.06 K/uL (ref 0.00–0.07)
Basophils Absolute: 0.1 K/uL (ref 0.0–0.1)
Basophils Relative: 1 %
Eosinophils Absolute: 0.4 K/uL (ref 0.0–0.5)
Eosinophils Relative: 4 %
HCT: 35.4 % — ABNORMAL LOW (ref 39.0–52.0)
Hemoglobin: 10.8 g/dL — ABNORMAL LOW (ref 13.0–17.0)
Immature Granulocytes: 1 %
Lymphocytes Relative: 13 %
Lymphs Abs: 1.2 K/uL (ref 0.7–4.0)
MCH: 28.4 pg (ref 26.0–34.0)
MCHC: 30.5 g/dL (ref 30.0–36.0)
MCV: 93.2 fL (ref 80.0–100.0)
Monocytes Absolute: 0.8 K/uL (ref 0.1–1.0)
Monocytes Relative: 9 %
Neutro Abs: 6.6 K/uL (ref 1.7–7.7)
Neutrophils Relative %: 72 %
Platelets: 136 K/uL — ABNORMAL LOW (ref 150–400)
RBC: 3.8 MIL/uL — ABNORMAL LOW (ref 4.22–5.81)
RDW: 16.8 % — ABNORMAL HIGH (ref 11.5–15.5)
WBC: 9.1 K/uL (ref 4.0–10.5)
nRBC: 0 % (ref 0.0–0.2)

## 2024-04-20 LAB — BASIC METABOLIC PANEL WITH GFR
Anion gap: 12 (ref 5–15)
BUN: 34 mg/dL — ABNORMAL HIGH (ref 8–23)
CO2: 22 mmol/L (ref 22–32)
Calcium: 9.8 mg/dL (ref 8.9–10.3)
Chloride: 104 mmol/L (ref 98–111)
Creatinine, Ser: 1.86 mg/dL — ABNORMAL HIGH (ref 0.61–1.24)
GFR, Estimated: 38 mL/min — ABNORMAL LOW (ref >60)
Glucose, Bld: 101 mg/dL — ABNORMAL HIGH (ref 70–99)
Potassium: 4.2 mmol/L (ref 3.5–5.1)
Sodium: 138 mmol/L (ref 135–145)

## 2024-04-20 LAB — BRAIN NATRIURETIC PEPTIDE: B Natriuretic Peptide: 2103.7 pg/mL — ABNORMAL HIGH (ref 0.0–100.0)

## 2024-04-20 LAB — RESP PANEL BY RT-PCR (RSV, FLU A&B, COVID)  RVPGX2
Influenza A by PCR: NEGATIVE
Influenza B by PCR: NEGATIVE
Resp Syncytial Virus by PCR: NEGATIVE
SARS Coronavirus 2 by RT PCR: NEGATIVE

## 2024-04-20 MED ORDER — AMIODARONE HCL 200 MG PO TABS
200.0000 mg | ORAL_TABLET | Freq: Every day | ORAL | Status: DC
  Administered 2024-04-20 – 2024-04-22 (×3): 200 mg via ORAL
  Filled 2024-04-20 (×3): qty 1

## 2024-04-20 MED ORDER — FUROSEMIDE 10 MG/ML IJ SOLN
40.0000 mg | Freq: Once | INTRAMUSCULAR | Status: AC
  Administered 2024-04-20: 40 mg via INTRAVENOUS
  Filled 2024-04-20: qty 4

## 2024-04-20 MED ORDER — CARVEDILOL 12.5 MG PO TABS
12.5000 mg | ORAL_TABLET | Freq: Two times a day (BID) | ORAL | Status: DC
  Administered 2024-04-20 – 2024-04-22 (×3): 12.5 mg via ORAL
  Filled 2024-04-20 (×4): qty 1

## 2024-04-20 MED ORDER — APIXABAN 5 MG PO TABS
5.0000 mg | ORAL_TABLET | Freq: Two times a day (BID) | ORAL | Status: DC
  Administered 2024-04-20 – 2024-04-22 (×4): 5 mg via ORAL
  Filled 2024-04-20 (×4): qty 1

## 2024-04-20 MED ORDER — FUROSEMIDE 10 MG/ML IJ SOLN
40.0000 mg | Freq: Two times a day (BID) | INTRAMUSCULAR | Status: AC
  Administered 2024-04-20 – 2024-04-21 (×3): 40 mg via INTRAVENOUS
  Filled 2024-04-20 (×3): qty 4

## 2024-04-20 MED ORDER — ATORVASTATIN CALCIUM 10 MG PO TABS
20.0000 mg | ORAL_TABLET | Freq: Every day | ORAL | Status: DC
  Administered 2024-04-20 – 2024-04-22 (×3): 20 mg via ORAL
  Filled 2024-04-20 (×3): qty 2

## 2024-04-20 NOTE — H&P (Addendum)
 History and Physical    Patient: Dean Kassim Sr. FMW:[id number] DOB: 1951-09-24 DOA: 04/20/2024 DOS: the patient was seen and examined on 04/20/2024 PCP: [name], MD  Patient coming from: Home  Chief Complaint:  Chief Complaint  Patient presents with   Shortness of Breath   HPI: Dean Rasnic Sr. is a 73 y.o. male with medical history significant of VT s/p AICD, PAF on Eliquis , HFrEF (EF 30-35% in 12/2023), ICM, HTN, HLD, and DM2 p/w elevated BNP, and pulmonary vascular congestion on CXR c/w HFrEF exacerbation c/b AKI.  Pt was in his USOH until having SOB at home all night that prevented him from sleeping. Pt reports having SOB over the past few days that progressed to the point of him being unable to lie flat and requiring him to sit up all night and remain awake because of his c/f for his SOB; as such, pt presented to the ED today at 1000. Of note, pt was evaluated in [facility] on 7/23 and noted to have ADHF at that time, but sent home with regular lasix  dosing w/o relief. Despite taking his home lasix  40mg  daily pt Cr has worsened, SOB/DOE has worsened, and pt weight has increased from 183 to 187. Of note, pt endorses eating fast food and adding salt to his food occasionally.    In the ED, pt hypertensive.  Labs notable for Cr 1.86 (up from 1.59 on 7/23 despite home diuresis), and BNP 2103.7. CXR with mild edema. EDP requested admission for IV diuresis.  Review of Systems: As mentioned in the history of present illness. All other systems reviewed and are negative. 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 09/2016 s/p MDT Visia AF MRI single lead ICD (ser # [id number]).   Arthritis    maybe in his spine (09/05/2016)   Asthma    pt. denies   Cardiac arrest (HCC)    a. 08/2016 VT/VF Arrest-->09/2016 s/p MDT Visia AF MRI single lead ICD (ser # [id number]).   Chronic HFrEF (heart failure with reduced ejection fraction)  (HCC)    a. 08/2016 Echo: EF 35-40%; b. 04/2018 Echo: EF 30-35%; c. 04/2020 Echo: EF 30-35%; d. 11/2021 Echo: EF 40-45%, GrI DD, nl RV fxn, sev dil LA, mild-mod MR, mild-mod AI, Ao root 38mm.   Coronary artery disease    a. 08/2016 VT Arrest/Cath: LM nl, LAD nl, LCX nl, RGCA 90p, 100d w/ L->R collats, EF 35-45%.   Gout    Hyperlipidemia LDL goal <70    Hypertension    Ischemic cardiomyopathy    a. 08/2016 Echo: EF 35-40%; b. 04/2018 Echo: EF 30-35%; c. 04/2020 Echo: EF 30-35%; d. 11/2021 Echo: EF 40-45%.   LBBB (left bundle branch block)    OSA on CPAP    does not wear CPAP   PAF (paroxysmal atrial fibrillation) (HCC)    a. CHA2DS2VASc = 5-->Eliquis .   Type II diabetes mellitus (HCC)    Past Surgical History:  Procedure Laterality Date   CARDIAC CATHETERIZATION N/A 09/07/2016   Procedure: Left Heart Cath and Coronary Angiography;  Surgeon: Peter M Swaziland, MD;  Location: [facility] INVASIVE CV LAB;  Service: Cardiovascular;  Laterality: N/A;   COLONOSCOPY WITH PROPOFOL  N/A 08/30/2018   Procedure: COLONOSCOPY WITH PROPOFOL ;  Surgeon: Rollin Dover, MD;  Location: [facility] END[facility]COPY;  Service: Endoscopy;  Laterality: N/A;   COLONOSCOPY WITH PROPOFOL  N/A 11/16/2023   Procedure: COLONOSCOPY WITH PROPOFOL ;  Surgeon: Rollin Dover, MD;  Location: [facility];  Service: Gastroenterology;  Laterality: N/A;   COLONOSCOPY WITH PROPOFOL  N/A 11/18/2023   Procedure: COLONOSCOPY WITH PROPOFOL ;  Surgeon: Charlanne Groom, MD;  Location: [facility];  Service: Gastroenterology;  Laterality: N/A;   EP IMPLANTABLE DEVICE N/A 10/10/2016   Procedure: ICD Implant;  Surgeon: Will Gladis Norton, MD;  Location: [facility] INVASIVE CV LAB;  Service: Cardiovascular;  Laterality: N/A;   HEMOSTASIS CLIP PLACEMENT  11/16/2023   Procedure: HEMOSTASIS CLIP PLACEMENT;  Surgeon: Rollin Dover, MD;  Location: [facility];  Service: Gastroenterology;;   HEMOSTASIS CLIP PLACEMENT  11/18/2023   Procedure: HEMOSTASIS CLIP PLACEMENT;  Surgeon: Charlanne Groom, MD;  Location: [facility];  Service: Gastroenterology;;   HEMOSTASIS CONTROL  11/18/2023   Procedure: HEMOSTASIS CONTROL;  Surgeon: Charlanne Groom, MD;  Location: [facility];  Service: Gastroenterology;;  PuraStat   LUMBAR LAMINECTOMY/DECOMPRESSION MICRODISCECTOMY N/A 01/18/2018   Procedure: L2-3, L3-4, L-4-5 CENTRAL DECOMPRESSION;  Surgeon: Barbarann Oneil BROCKS, MD;  Location: [facility] OR;  Service: Orthopedics;  Laterality: N/A;   LUMBAR LAMINECTOMY/DECOMPRESSION MICRODISCECTOMY N/A 08/12/2020   Procedure: THORACIC ELEVEN LAMINECTOMY;  Surgeon: Louis Shove, MD;  Location: [facility] OR;  Service: Neurosurgery;  Laterality: N/A;   POLYPECTOMY  08/30/2018   Procedure: POLYPECTOMY;  Surgeon: Rollin Dover, MD;  Location: [facility];  Service: Endoscopy;;   POLYPECTOMY  11/16/2023   Procedure: POLYPECTOMY;  Surgeon: Rollin Dover, MD;  Location: [facility];  Service: Gastroenterology;;   Social History:  reports that he has never smoked. He has never used smokeless tobacco. He reports that he does not drink alcohol and does not use drugs.  Allergies  Allergen Reactions   Contrast Media [Iodinated Contrast Media] Shortness Of Breath and Nausea And Vomiting   Lisinopril  Cough    Family History  Problem Relation Age of Onset   Heart attack Father        died in his 52's   Hypertension Sister    Cancer Brother        uncertain, leg   Hypertension Sister     Prior to Admission medications   Medication Sig Start Date End Date Taking? Authorizing Provider  allopurinol  (ZYLOPRIM ) 300 MG tablet Take 3 tablets by mouth daily. 06/13/23   [provider]  amiodarone  (PACERONE ) 200 MG tablet Take 1 tablet (200 mg total) by mouth daily. 09/06/22   Leverne Charlies Helling, PA-C  atorvastatin  (LIPITOR) 20 MG tablet Take 1 tablet (20 mg total) by mouth daily. 02/06/24 05/06/24  Mona Vinie BROCKS, MD  carvedilol  (COREG ) 12.5 MG tablet TAKE 1 TABLET (12.5MG  TOTAL) BY MOUTH TWICE A DAY WITH MEALS 08/28/23    Leverne Charlies Helling, PA-C  cholecalciferol  (VITAMIN D3) 25 MCG (1000 UNIT) tablet Take 1,000 Units by mouth daily. 06/13/23   [provider]  ELIQUIS  5 MG TABS tablet TAKE 1 TABLET BY MOUTH TWICE A DAY 04/20/23   Camnitz, Soyla Gladis, MD  ferrous sulfate 325 (65 FE) MG tablet Take 325 mg by mouth daily. 11/08/23   [provider]  furosemide  (LASIX ) 40 MG tablet Take 1 tablet (40 mg total) by mouth daily. 12/12/23   Vicci Rollo SAUNDERS, PA-C  magnesium  oxide (MAG-OX) 400 (240 Mg) MG tablet TAKE 1 TABLET BY MOUTH EVERY DAY Patient not taking: Reported on 02/06/2024 09/06/22   Mona Vinie BROCKS, MD  valsartan  (DIOVAN ) 40 MG tablet Take 1 tablet (40 mg total) by mouth daily. 11/27/23   Vicci Rollo SAUNDERS, PA-C    Physical Exam: Vitals:   04/20/24 1315 04/20/24 1345 04/20/24 1400 04/20/24 1426  BP: 137/87 (!) 132/56  108/87  Pulse: (!) 59 60  63  Resp: (!) 21 14  18   Temp:   98 F (36.7 C) 97.7 F (36.5 C)  TempSrc:   Oral Oral  SpO2: 100% 99%  100%  Weight:      Height:       General: Alert, oriented x3, resting comfortably in no acute distress Respiratory: Bibasilar rales; no wheezing Cardiovascular: Regular rate and rhythm w/o m/r/g   Data Reviewed:  Lab Results  Component Value Date   WBC 9.1 04/20/2024   HGB 10.8 (L) 04/20/2024   HCT 35.4 (L) 04/20/2024   MCV 93.2 04/20/2024   PLT 136 (L) 04/20/2024   Lab Results  Component Value Date   GLUCOSE 101 (H) 04/20/2024   CALCIUM  9.8 04/20/2024   NA 138 04/20/2024   K 4.2 04/20/2024   CO2 22 04/20/2024   CL 104 04/20/2024   BUN 34 (H) 04/20/2024   CREATININE 1.86 (H) 04/20/2024   Lab Results  Component Value Date   ALT 23 04/16/2024   AST 27 04/16/2024   ALKPHOS 80 04/16/2024   BILITOT 1.6 (H) 04/16/2024   Lab Results  Component Value Date   INR 1.8 (H) 11/17/2023   INR 1.5 (H) 08/12/2020   INR 1.6 (H) 04/06/2019    Radiology: DG Chest 2 View Result Date: 04/20/2024 CLINICAL DATA:  Shortness of  breath. EXAM: CHEST - 2 VIEW COMPARISON:  Chest radiograph dated 04/18/2023. FINDINGS: Cardiomegaly with vascular congestion and mild edema. No consolidative changes. No large pleural effusion or pneumothorax. Left pectoral AICD device. No acute osseous pathology. IMPRESSION: Cardiomegaly with mild edema. Electronically Signed   By: Vanetta Chou M.D.   On: 04/20/2024 11:28     Assessment and Plan: 36M h/o VT s/p AICD, PAF on Eliquis , HFrEF (EF 30-35% in 12/2023), ICM, HTN, HLD, and DM2 p/w elevated BNP, and pulmonary vascular congestion on CXR c/w HFrEF exacerbation c/b AKI.  HFrEF exacerbation Elevated BNP -RD consulted for low sodium diet teaching -IV lasix  40mg  BID for now; goal net neg 1-2L/d; strict I/Os; daily standing weights; K>4/Mg>2; anticipate pt will need higher PO lasix  dose on discharge such as 40mg  BID or 80mg  daily -PTA Coreg  12.5mg  BID -HOLD pta valsartan  to allow for increased diuresis -OP Cards referral at d/c vs IP cards eval if ongoing Cr rise with diuresis  AKI Cr 1.86 (up from 1.59 on 7/23 despite home diuresis; unknown baseline due to lability) -IV diuresis per above -Strict I&Os and daily weights (standing preferred) -F/u BMP daily -Renally dose medications for CrCl -Avoid lovenox , NSAIDs, morphine , Fleet's phosphate enema, regular insulin , contrast; no gadolinium for MRI to avoid nephrogenic systemic fibrosis -Consider renal US  and nephrology consult if worsening AKI or lack of improvementening AKI or lack of improvement   PAF -PTA amiodarone , Coreg  and Eliquis   HTN -HOLD pta valsartan  for now; resume at d/c  HLD -PTA atorvastatin  20mg  daily  DM2 -SSI ACHS prn   Advance Care Planning:   Code Status: Full Code   Consults: N/A  Family Communication: N/A  Severity of Illness: The appropriate patient status for this patient is INPATIENT. Inpatient status is judged to be reasonable and necessary in order to provide the required intensity of service  to ensure the patient's safety. The patient's presenting symptoms, physical exam findings, and initial radiographic and laboratory data in the context of their chronic comorbidities is felt to place them at high risk for further clinical deterioration. Furthermore,  it is not anticipated that the patient will be medically stable for discharge from the hospital within 2 midnights of admission.   * I certify that at the point of admission it is my clinical judgment that the patient will require inpatient hospital care spanning beyond 2 midnights from the point of admission due to high intensity of service, high risk for further deterioration and high frequency of surveillance required.*   ------- I spent 60 minutes reviewing previous labs/notes, obtaining separate history at the bedside, counseling/discussing the treatment plan outlined above, ordering medications/tests, and performing clinical documentation.  Author: Marsha Ada, MD 04/20/2024 2:26 PM  For on call review [email or web address].

## 2024-04-20 NOTE — ED Notes (Signed)
 FRESH LINEN PROVIDED TO PT

## 2024-04-20 NOTE — ED Triage Notes (Signed)
 Pt c.o sob x 1 week, pt seen here 4 days ago for different symptoms. Pt denies pain. Pt also has swelling in his legs. Resp e/u

## 2024-04-20 NOTE — ED Notes (Signed)
 Patient transported to X-ray

## 2024-04-20 NOTE — ED Provider Notes (Signed)
 [place] EMERGENCY DEPARTMENT AT [facility] Provider Note   CSN: [id number] Arrival date & time: 04/20/24  1004     Patient presents with: Shortness of Breath   Dean Pustejovsky Sr. is a 73 y.o. male.  {Add pertinent medical, surgical, social history, OB history to HPI:4136} 73 year old male with past medical history of CHF and atrial fibrillation presenting to the emergency department today with shortness of breath.  The patient states that he has been taking his Lasix  as prescribed but has had worsening shortness of breath as well as orthopnea over the past few days.  He states that he was on higher doses of Lasix  but this was discontinued due to his renal function.  He states that this was decreased 2 months ago secondary to this and he has had gradual worsening of weight gain since then.  Denies any associated chest pain.  He came to the ER today due to the symptoms.   Shortness of Breath      Prior to Admission medications   Medication Sig Start Date End Date Taking? Authorizing Provider  allopurinol  (ZYLOPRIM ) 300 MG tablet Take 300 mg by mouth daily. 06/13/23   [provider]  amiodarone  (PACERONE ) 200 MG tablet Take 1 tablet (200 mg total) by mouth daily. 09/06/22   Leverne Charlies Helling, PA-C  atorvastatin  (LIPITOR) 20 MG tablet Take 1 tablet (20 mg total) by mouth daily. 02/06/24 05/06/24  Mona Vinie BROCKS, MD  carvedilol  (COREG ) 12.5 MG tablet TAKE 1 TABLET (12.5MG  TOTAL) BY MOUTH TWICE A DAY WITH MEALS 08/28/23   Leverne Charlies Helling, PA-C  cholecalciferol  (VITAMIN D3) 25 MCG (1000 UNIT) tablet Take 1,000 Units by mouth daily. 06/13/23   [provider]  ELIQUIS  5 MG TABS tablet TAKE 1 TABLET BY MOUTH TWICE A DAY 04/20/23   Camnitz, Soyla Lunger, MD  ferrous sulfate 325 (65 FE) MG tablet Take 325 mg by mouth daily. 11/08/23   [provider]  furosemide  (LASIX ) 40 MG tablet Take 1 tablet (40 mg total) by mouth daily. 12/12/23   Vicci Rollo SAUNDERS,  PA-C  magnesium  oxide (MAG-OX) 400 (240 Mg) MG tablet TAKE 1 TABLET BY MOUTH EVERY DAY Patient not taking: Reported on 02/06/2024 09/06/22   Mona Vinie BROCKS, MD  valsartan  (DIOVAN ) 40 MG tablet Take 1 tablet (40 mg total) by mouth daily. 11/27/23   Vicci Rollo SAUNDERS, PA-C    Allergies: Contrast media [iodinated contrast media] and Lisinopril     Review of Systems  Respiratory:  Positive for shortness of breath.   All other systems reviewed and are negative.   Updated Vital Signs BP 137/87   Pulse (!) 59   Temp 98 F (36.7 C)   Resp (!) 21   Ht 5' 6 (1.676 m)   Wt 84.8 kg   SpO2 100%   BMI 30.18 kg/m   Physical Exam Vitals and nursing note reviewed.   Gen: NAD Eyes: PERRL, EOMI HEENT: no oropharyngeal swelling Neck: trachea midline Resp: clear to auscultation bilaterally Card: RRR, no murmurs, rubs, or gallops Abd: nontender, nondistended Extremities: no calf tenderness,1+ pitting edema bilaterally Vascular: 2+ radial pulses bilaterally, 2+ DP pulses bilaterally Skin: no rashes Psyc: acting appropriately   (all labs ordered are listed, but only abnormal results are displayed) Labs Reviewed  BASIC METABOLIC PANEL WITH GFR - Abnormal; Notable for the following components:      Result Value   Glucose, Bld 101 (*)    BUN 34 (*)  Creatinine, Ser 1.86 (*)    GFR, Estimated 38 (*)    All other components within normal limits  CBC WITH DIFFERENTIAL/PLATELET - Abnormal; Notable for the following components:   RBC 3.80 (*)    Hemoglobin 10.8 (*)    HCT 35.4 (*)    RDW 16.8 (*)    Platelets 136 (*)    All other components within normal limits  BRAIN NATRIURETIC PEPTIDE - Abnormal; Notable for the following components:   B Natriuretic Peptide 2,103.7 (*)    All other components within normal limits  RESP PANEL BY RT-PCR (RSV, FLU A&B, COVID)  [id number]    EKG: EKG Interpretation Date/Time:  Sunday April 20 2024 10:16:46 EDT Ventricular Rate:  63 PR  Interval:  248 QRS Duration:  149 QT Interval:  553 QTC Calculation: 567 R Axis:   260  Text Interpretation: Sinus rhythm Prolonged PR interval IVCD, consider atypical LBBB Confirmed by Ula Barter [phone]) on 04/20/2024 10:24:48 AM  Radiology: DG Chest 2 View Result Date: 04/20/2024 CLINICAL DATA:  Shortness of breath. EXAM: CHEST - 2 VIEW COMPARISON:  Chest radiograph dated 04/18/2023. FINDINGS: Cardiomegaly with vascular congestion and mild edema. No consolidative changes. No large pleural effusion or pneumothorax. Left pectoral AICD device. No acute osseous pathology. IMPRESSION: Cardiomegaly with mild edema. Electronically Signed   By: Vanetta Chou M.D.   On: 04/20/2024 11:28    {Document cardiac monitor, telemetry assessment procedure when appropriate:32947} Procedures   Medications Ordered in the ED  furosemide  (LASIX ) injection 40 mg (40 mg Intravenous Given 04/20/24 1155)      {Click here for ABCD2, HEART and other calculators REFRESH Note before signing:1}                              Medical Decision Making 73 year old male with past medical history of CHF and hypertension presenting to the emergency department today with lower extremity swelling and shortness of breath that is worsened over the past few weeks after going back on his Lasix .  The patient does appear volume overloaded here on exam.  Will further evaluate him here with basic labs as well as an EKG and chest x-ray to evaluate for pulmonary edema.  Will also obtain a BNP.  The patient's workup is revealing for what appears to be acute CHF.  The patient's renal function is mildly elevated compared to baseline.  He is given Lasix  for the volume overload.  Call was placed to cardiology to discuss to see if they would like to diurese patient here and keep an eye on his renal function or if he can go home with close outpatient follow-up.  Amount and/or Complexity of Data Reviewed Labs: ordered. Radiology:  ordered.  Risk Prescription drug management.   ***  {Document critical care time when appropriate  Document review of labs and clinical decision tools ie CHADS2VASC2, etc  Document your independent review of radiology images and any outside records  Document your discussion with family members, caretakers and with consultants  Document social determinants of health affecting pt's care  Document your decision making why or why not admission, treatments were needed:32947:::1}   Final diagnoses:  None    ED Discharge Orders     None

## 2024-04-20 NOTE — ED Notes (Signed)
 Pt oxygen maintained at 99-100% on room air while ambulating in the hallway.

## 2024-04-20 NOTE — ED Notes (Signed)
 Urinal placed at bedside

## 2024-04-20 NOTE — ED Notes (Signed)
PT ASSISTED TO THE RESTROOM

## 2024-04-21 LAB — BASIC METABOLIC PANEL WITH GFR
Anion gap: 11 (ref 5–15)
BUN: 37 mg/dL — ABNORMAL HIGH (ref 8–23)
CO2: 23 mmol/L (ref 22–32)
Calcium: 9.8 mg/dL (ref 8.9–10.3)
Chloride: 103 mmol/L (ref 98–111)
Creatinine, Ser: 1.93 mg/dL — ABNORMAL HIGH (ref 0.61–1.24)
GFR, Estimated: 36 mL/min — ABNORMAL LOW (ref >60)
Glucose, Bld: 96 mg/dL (ref 70–99)
Potassium: 4.1 mmol/L (ref 3.5–5.1)
Sodium: 137 mmol/L (ref 135–145)

## 2024-04-21 MED ORDER — POLYETHYLENE GLYCOL 3350 17 G PO PACK
17.0000 g | PACK | Freq: Every day | ORAL | Status: DC
  Administered 2024-04-21 – 2024-04-22 (×2): 17 g via ORAL
  Filled 2024-04-21 (×2): qty 1

## 2024-04-21 MED ORDER — SENNOSIDES-DOCUSATE SODIUM 8.6-50 MG PO TABS
1.0000 | ORAL_TABLET | Freq: Two times a day (BID) | ORAL | Status: DC
  Administered 2024-04-21 – 2024-04-22 (×3): 1 via ORAL
  Filled 2024-04-21 (×3): qty 1

## 2024-04-21 MED ORDER — BISACODYL 10 MG RE SUPP: 10.0000 mg | Freq: Once | RECTAL | Status: DC

## 2024-04-21 MED ORDER — MELATONIN 3 MG PO TABS
3.0000 mg | ORAL_TABLET | Freq: Every evening | ORAL | Status: DC | PRN
  Administered 2024-04-21: 3 mg via ORAL
  Filled 2024-04-21: qty 1

## 2024-04-21 NOTE — Evaluation (Signed)
 Physical Therapy Evaluation Patient Details Name: Dean Gayden Sr. MRN: [id number] DOB: [birth year] Today's Date: 04/21/2024  History of Present Illness  73 year old male presented to the emergency room 7/27 with complaint of shortness of breath, orthopnea. Admitted for acute on chronic combined systolic/diastolic CHF. PMH: V. tach status post ICD placement, paroxysmal A-fib on Eliquis , HFrEF with EF of 30 to 35%, hypertension, ischemic cardiomyopathy, hyperlipidemia, diabetes type 2.  Clinical Impression  Pt admitted with above diagnosis. Previously independent with intermittent use of SPC over the past couple of weeks due to LE weakness. 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 Currently requires CGA and SPC to safely ambulate with intermittent episodes instability. Suspect this will improve with continued medical care but anticipate OPPT follow-up will be important to help restore PLOF due to high demand job. Pt currently with functional limitations due to the deficits listed below (see PT Problem List). Pt will benefit from acute skilled PT to increase their independence and safety with mobility to allow discharge.           If plan is discharge home, recommend the following: A little help with walking and/or transfers;A little help with bathing/dressing/bathroom;Assistance with cooking/housework;Help with stairs or ramp for entrance   Can travel by private vehicle        Equipment Recommendations None recommended by PT (agreeable to use his rollator at home upon d/c.)  Recommendations for Other Services       Functional Status Assessment Patient has had a recent decline in their functional status and demonstrates the ability to make significant improvements in function in a reasonable and predictable amount of time.     Precautions / Restrictions Precautions Precautions: Fall Recall of Precautions/Restrictions: Intact Restrictions Weight Bearing  Restrictions Per Provider Order: No      Mobility  Bed Mobility Overal bed mobility: Needs Assistance Bed Mobility: Supine to Sit     Supine to sit: Supervision     General bed mobility comments: Supervision for safety, extra time, no physical assist.    Transfers Overall transfer level: Needs assistance Equipment used: Straight cane Transfers: Sit to/from Stand Sit to Stand: Supervision           General transfer comment: Supervision for safety, minimal sway with SPC to steady himself.    Ambulation/Gait Ambulation/Gait assistance: Contact guard assist Gait Distance (Feet): 85 Feet Assistive device: Straight cane Gait Pattern/deviations: Step-through pattern, Decreased stride length, Staggering left, Drifts right/left Gait velocity: dec Gait velocity interpretation: <1.8 ft/sec, indicate of risk for recurrent falls   General Gait Details: CGA for safety with mild stagger x2 more exacerbated sway with turns. Cues for safety, awareness, and SPC use. Pt prefers excessively high SPC setting. Discussed using RW due to increased instability. HR 67 with gait.  Stairs            Wheelchair Mobility     Tilt Bed    Modified Rankin (Stroke Patients Only)       Balance Overall balance assessment: Needs assistance Sitting-balance support: No upper extremity supported, Feet supported Sitting balance-Leahy Scale: Good     Standing balance support: No upper extremity supported, During functional activity Standing balance-Leahy Scale: Fair Standing balance comment: More stable with SPC                             Pertinent Vitals/Pain Pain Assessment Pain Assessment: No/denies pain    Home Living Family/patient expects to  be discharged to:: Private residence Living Arrangements: Children Available Help at Discharge: Family;Available 24 hours/day Type of Home: House Home Access: Level entry     Alternate Level Stairs-Number of Steps: 13 Home  Layout: Two level;Bed/bath upstairs Home Equipment: Grab bars - tub/shower;Grab bars - toilet;Rollator (4 wheels);Cane - single point;Shower seat;BSC/3in1      Prior Function Prior Level of Function : Independent/Modified Independent;Driving;Working/employed             Mobility Comments: spc for gait at times 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 Mild dyspnea. Educated on symptom awareness, energy conservation, follow-up recs.    Exercises     Assessment/Plan    PT Assessment Patient needs continued PT services  PT Problem List Decreased strength;Decreased activity tolerance;Decreased balance;Decreased mobility;Decreased coordination;Decreased knowledge of use of DME;Cardiopulmonary status limiting activity       PT Treatment Interventions DME instruction;Gait training;Stair training;Functional mobility training;Therapeutic activities;Therapeutic exercise;Balance training;Neuromuscular re-education;Patient/family education    PT Goals (Current goals can be found in the Care Plan section)  Acute Rehab PT Goals Patient Stated Goal: Get well, go home, work PT Goal Formulation: With patient Time For Goal Achievement: 05/05/24 Potential to Achieve Goals: Good    Frequency Min 2X/week     Co-evaluation               AM-PAC PT 6 Clicks Mobility   Outcome Measure Help needed turning from your back to your side while in a flat bed without using bedrails?: None Help needed moving from lying on your back to sitting on the side of a flat bed without using bedrails?: A Little Help needed moving to and from a bed to a chair (including a wheelchair)?: A Little Help needed standing up from a chair using your arms (e.g., wheelchair or bedside chair)?: A Little Help needed to walk in hospital room?: A Little Help needed climbing 3-5 steps with a railing? : A Little 6 Click Score: 19    End of Session   Activity Tolerance: Patient tolerated treatment well Patient left: in chair;with call bell/phone within reach;with nursing/sitter in room [facility] assisting with bath at sink)   PT Visit Diagnosis: Unsteadiness on feet (R26.81);Other abnormalities of gait and mobility (R26.89);Muscle weakness (generalized) (M62.81);Difficulty in walking, not elsewhere classified (R26.2)    Time: 8682-8667 PT Time Calculation (min) (ACUTE ONLY): 15 min   Charges:   PT Evaluation $PT Eval Low Complexity: 1 Low   PT General Charges $$ ACUTE PT VISIT: 1 Visit         Leontine Roads, PT, DPT [facility]  Rehabilitation Services Physical Therapist Office: [phone] Website: [email or web address]   Leontine GORMAN Roads 04/21/2024, 2:48 PM

## 2024-04-21 NOTE — Progress Notes (Signed)
   04/21/24 2229  Provider Notification  Provider Name/Title Dr. Charlton  Date Provider Notified 04/21/24  Time Provider Notified 2229  Method of Notification Page  Notification Reason Requested by patient for sleeping aid.  Provider response ;Evaluate remotely. See new order for Melatonin 3 mg PRN.  Date of Provider Response 04/21/24  Time of Provider Response 2230   Wendi Dash, RN

## 2024-04-21 NOTE — TOC CM/SW Note (Signed)
 Transition of Care [facility]) - Inpatient Brief Assessment   Patient Details  Name: Dean Imburgia Sr. MRN: [id number] Date of Birth: 12-13-1950  Transition [facility]) CM/SW Contact:    Luise JAYSON Pan, LCSWA Phone Number: 04/21/2024, 10:43 AM   Clinical Narrative: Patient is from home with his daughter, patient stated he may need assistance with transportation at DC as his daughter works. Patient reports having the following DME: walker, cane. Patient has a PCP [facility]. Patient reports having HH services; per chart review patient had [facility]. Patient has prior hx at [facility].   TOC will continue to follow.     Transition of Care Asessment: Insurance and Status: Insurance coverage has been reviewed Patient has primary care physician: Yes Home environment has been reviewed: From home with daughter Prior level of function:: Independent Prior/Current Home Services: No current home services Social Drivers of Health Review: SDOH reviewed no interventions necessary Readmission risk has been reviewed: Yes Transition of care needs: transition of care needs identified, TOC will continue to follow

## 2024-04-21 NOTE — Progress Notes (Signed)
 PROGRESS NOTE  Dean Croft Sr.  FMW:[id number] DOB: [birth year] DOA: 04/20/2024 PCP: [name], MD   Brief Narrative: Patient is a 73 year old male with history of V. tach status post ICD placement, paroxysmal A-fib on Eliquis , HFrEF with EF of 30 to 35%, hypertension, ischemic cardiomyopathy, hyperlipidemia, diabetes type 2 presented to the emergency room with complaint of shortness of breath, orthopnea.  He was recently started on Lasix .  He also noted weight gain.  On presentation, he was hypertensive.  Lab work showed creatinine of 1.86, elevated BNP.  Chest x-ray showed pulm vascular congestion.  Started on IV Lasix  and admitted for the management of acute on chronic HFrEF and AKI  Assessment & Plan:  Principal Problem:   Acute on chronic heart failure (HCC)   Acute on chronic combined systolic/diastolic CHF: Presented with weight gain, orthopnea, dyspnea.  Echo done on 01/01/2024 showed EF of 30 to 35%, grade 2 diastolic dysfunction.  Chest x-ray on presentation showed cardiomegaly with mild edema.  Elevated BNP.  Started on IV Lasix .  Continue to monitor input/output, daily weight.  Will continue IV Lasix  for today.  Takes oral Lasix  at home.  CKD stage IIIb: Baseline creatinine around 1.8-2.  Currently kidney function close to baseline.  Follows with [facility].  Continue IV diuresis  History of V. tach/paroxysmal A-fib: Currently rate is controlled.  On amiodarone , carvedilol .  On Eliquis  for anticoagulation.  Monitor on telemetry.  Status post AICD  Hypertension:, Takes valsartan  at home ,currently blood pressure stable  Hyperlipidemia: On Lipitor  Diabetes type 2: On sliding scale  insulin .  Monitor blood sugars.  Deconditioning/weakness: Will consult physical therapy  Constipation: No bowel movement for last 3 to 4 days.  Ordered bowel regimen along with Dulcolax suppository       DVT prophylaxis: apixaban  (ELIQUIS ) tablet 5 mg     Code Status: Full  Code  Family Communication: None at bedside  Patient status:Inpatient  Patient is from :home  Anticipated discharge un:ynfz  Estimated DC date:1-2 days   Consultants: None  Procedures:None  Antimicrobials:  Anti-infectives (From admission, onward)    None       Subjective: Patient seen and examined at the bedside today.  Hemodynamically stable.  Overall comfortable, lying on bed.  Denies any shortness of breath or cough.  No significant lower extremity edema noted.  On room air.  Report of no bowel movement for last few days also complains of weakness.  We discussed about continuing IV Lasix  for today  Objective: Vitals:   04/20/24 2100 04/20/24 2129 04/21/24 0038 04/21/24 0528  BP: 137/69 136/89 (!) 135/48 (!) 141/50  Pulse: 65 63 62 60  Resp: 20 18 20  (!) 21  Temp:  98 F (36.7 C) 98.2 F (36.8 C) 97.7 F (36.5 C)  TempSrc:  Oral Oral Oral  SpO2: 97% 100% 100% 100%  Weight:  88.5 kg    Height:  5' 6 (1.676 m)      Intake/Output Summary (Last 24 hours) at 04/21/2024 0740 Last data filed at 04/21/2024 0532 Gross per 24 hour  Intake 336 ml  Output 1300 ml  Net -964 ml   Filed Weights   04/20/24 1012 04/20/24 2129  Weight: 84.8 kg 88.5 kg    Examination:   General exam: Overall comfortable, not in distress,obese HEENT: PERRL Respiratory system:  no wheezes or crackles, mildly diminished sounds at bases Cardiovascular system: S1 & S2 heard, RRR.  Gastrointestinal system: Abdomen is nondistended, soft and nontender. Central nervous  system: Alert and oriented Extremities: No edema, no clubbing ,no cyanosis Skin: No rashes, no ulcers,no icterus     Data Reviewed: I have personally reviewed following labs and imaging studies  CBC: Recent Labs  Lab 04/16/24 2252 04/20/24 1052  WBC 8.3 9.1  NEUTROABS 5.9 6.6  HGB 11.2* 10.8*  HCT 37.4* 35.4*  MCV 92.3 93.2  PLT 170 136*   Basic Metabolic Panel: Recent Labs  Lab 04/16/24 2252 04/20/24 1052  04/21/24 0256  NA 138 138 137  K 3.9 4.2 4.1  CL 102 104 103  CO2 25 22 23   GLUCOSE 125* 101* 96  BUN 36* 34* 37*  CREATININE 1.59* 1.86* 1.93*  CALCIUM  10.0 9.8 9.8     Recent Results (from the past 240 hours)  Resp panel by RT-PCR (RSV, Flu A&B, Covid) Anterior Nasal Swab     Status: None   Collection Time: 04/20/24 10:22 AM   Specimen: Anterior Nasal Swab  Result Value Ref Range Status   SARS Coronavirus 2 by RT PCR NEGATIVE NEGATIVE Final   Influenza A by PCR NEGATIVE NEGATIVE Final   Influenza B by PCR NEGATIVE NEGATIVE Final    Comment: (NOTE) The Xpert Xpress SARS-CoV-2/FLU/RSV plus assay is intended as an aid in the diagnosis of influenza from Nasopharyngeal swab specimens and should not be used as a sole basis for treatment. Nasal washings and aspirates are unacceptable for Xpert Xpress SARS-CoV-2/FLU/RSV testing.  Fact Sheet for Patients: BloggerCourse.com  Fact Sheet for Healthcare Providers: SeriousBroker.it  This test is not yet approved or cleared by the [place]  FDA and has been authorized for detection and/or diagnosis of SARS-CoV-2 by FDA under an Emergency Use Authorization (EUA). This EUA will remain in effect (meaning this test can be used) for the duration of the COVID-19 declaration under Section 564(b)(1) of the Act, 21 U.S.C. section 360bbb-3(b)(1), unless the authorization is terminated or revoked.     Resp Syncytial Virus by PCR NEGATIVE NEGATIVE Final    Comment: (NOTE) Fact Sheet for Patients: BloggerCourse.com  Fact Sheet for Healthcare Providers: SeriousBroker.it  This test is not yet approved or cleared by the [place]  FDA and has been authorized for detection and/or diagnosis of SARS-CoV-2 by FDA under an Emergency Use Authorization (EUA). This EUA will remain in effect (meaning this test can be used) for the duration of  the COVID-19 declaration under Section 564(b)(1) of the Act, 21 U.S.C. section 360bbb-3(b)(1), unless the authorization is terminated or revoked.  Performed at [facility], [address] [place], [place] [address]      Radiology Studies: DG Chest 2 View Result Date: 04/20/2024 CLINICAL DATA:  Shortness of breath. EXAM: CHEST - 2 VIEW COMPARISON:  Chest radiograph dated 04/18/2023. FINDINGS: Cardiomegaly with vascular congestion and mild edema. No consolidative changes. No large pleural effusion or pneumothorax. Left pectoral AICD device. No acute osseous pathology. IMPRESSION: Cardiomegaly with mild edema. Electronically Signed   By: Vanetta Chou M.D.   On: 04/20/2024 11:28    Scheduled Meds:  amiodarone   200 mg Oral Daily   apixaban   5 mg Oral BID   atorvastatin   20 mg Oral Daily   carvedilol   12.5 mg Oral BID WC   furosemide   40 mg Intravenous BID   Continuous Infusions:   LOS: 1 day   Ivonne Mustache, MD [facility] P7/28/2025, 7:40 AM

## 2024-04-21 NOTE — Plan of Care (Signed)

## 2024-04-22 ENCOUNTER — Other Ambulatory Visit: Payer: Self-pay | Admitting: Cardiology

## 2024-04-22 DIAGNOSIS — I5023 Acute on chronic systolic (congestive) heart failure: Secondary | ICD-10-CM | POA: Diagnosis not present

## 2024-04-22 LAB — BASIC METABOLIC PANEL WITH GFR
Anion gap: 8 (ref 5–15)
BUN: 43 mg/dL — ABNORMAL HIGH (ref 8–23)
CO2: 26 mmol/L (ref 22–32)
Calcium: 9.7 mg/dL (ref 8.9–10.3)
Chloride: 100 mmol/L (ref 98–111)
Creatinine, Ser: 2.17 mg/dL — ABNORMAL HIGH (ref 0.61–1.24)
GFR, Estimated: 31 mL/min — ABNORMAL LOW (ref >60)
Glucose, Bld: 126 mg/dL — ABNORMAL HIGH (ref 70–99)
Potassium: 3.9 mmol/L (ref 3.5–5.1)
Sodium: 134 mmol/L — ABNORMAL LOW (ref 135–145)

## 2024-04-22 MED ORDER — FUROSEMIDE 40 MG PO TABS: 40.0000 mg | ORAL_TABLET | Freq: Every day | ORAL | 0 refills | Status: DC

## 2024-04-22 MED ORDER — POLYETHYLENE GLYCOL 3350 17 G PO PACK: 17.0000 g | PACK | Freq: Every day | ORAL | 0 refills | Status: DC

## 2024-04-22 MED ORDER — ORAL CARE MOUTH RINSE: 15.0000 mL | OROMUCOSAL | Status: DC | PRN

## 2024-04-22 NOTE — TOC CM/SW Note (Signed)
 Transition of Care [facility]) - Inpatient Brief Assessment   Patient Details  Name: Dean [name]. MRN: [id number] Date of Birth: 08/19/1951  [facility]) CM/SW Contact:    Waddell Barnie Rama, RN Phone Number: 04/22/2024, 11:12 AM   Clinical Narrative: From home with daughters, has PCP and insurance on file, states has no HH services in place at this time, has cane and rollator at home.  States sister will transport them home at [employer] and family is support system, [facility] [address]  Pta self ambulatory with cane/rollator.   Per pt eval rec outpatient physical therapy.  Patient states he would like to go to the outpatient physical therapy on [facility].  NCM will make referral thur epic.     Transition of Care Asessment: Insurance and Status: Insurance coverage has been reviewed Patient has primary care physician: Yes Home environment has been reviewed: home with daughters Prior level of function:: indep witn rollator/cane Prior/Current Home Services: Current home services (cane, rollator) Social Drivers of Health Review: SDOH reviewed no interventions necessary Readmission risk has been reviewed: Yes Transition of care needs: no transition of care needs at this time

## 2024-04-22 NOTE — Progress Notes (Signed)
 Mobility Specialist Progress Note:    04/22/24 1028  Mobility  Activity Ambulated with assistance (In hallway)  Level of Assistance Contact guard assist, steadying assist  Assistive Device Cane  Distance Ambulated (ft) 84 ft  Activity Response Tolerated well  Mobility Referral Yes  Mobility visit 1 Mobility  Mobility Specialist Start Time (ACUTE ONLY) [id number]  Mobility Specialist Stop Time (ACUTE ONLY) 0930  Mobility Specialist Time Calculation (min) (ACUTE ONLY) 9 min   Received pt in bed and agreeable to mobility. No physical assistance needed. C/o feeling tired, otherwise tolerated well. Returned to room and left in bed. Personal belongings and call light within reach. All needs met.  Lavanda Pollack Mobility Specialist  Please contact via [employer] or  Rehab Office [phone]

## 2024-04-22 NOTE — Plan of Care (Signed)
 Plan of care is reviewed. Pt has been progressing. He is alert and fully oriented x 4, afebrile, stable hemodynamically, Sinus Bradycardia on the monitor, HR 50s. On room air, normal respiratory effort, no acute distress noted overnight. Pt is able to rest well, no major complaints.  Pt is able to ambulate independently in his room, well tolerated. We will continue to monitor.   Problem: Clinical Measurements: Goal: Ability to maintain clinical measurements within normal limits will improve Outcome: Progressing Goal: Will remain free from infection Outcome: Progressing Goal: Diagnostic test results will improve Outcome: Progressing Goal: Respiratory complications will improve Outcome: Progressing Goal: Cardiovascular complication will be avoided Outcome: Progressing   Problem: Health Behavior/Discharge Planning: Goal: Ability to manage health-related needs will improve Outcome: Progressing   Problem: Activity: Goal: Risk for activity intolerance will decrease Outcome: Progressing   Problem: Elimination: Goal: Will not experience complications related to bowel motility Outcome: Progressing Goal: Will not experience complications related to urinary retention Outcome: Progressing   Wendi Dash, RN

## 2024-04-22 NOTE — Discharge Summary (Signed)
 Physician Discharge Summary  Dean Croft Sr. FMW:[id number] DOB: [birth year] DOA: 04/20/2024  PCP: [name], MD  Admit date: 04/20/2024 Discharge date: 04/22/2024  Admitted From: Home Disposition:  Home  Discharge Condition:Stable CODE STATUS:FULL Diet recommendation: Heart Healthy  Brief/Interim Summary:  Patient is a 73 year old male with history of V. tach status post ICD placement, paroxysmal A-fib on Eliquis , HFrEF with EF of 30 to 35%, hypertension, ischemic cardiomyopathy, hyperlipidemia who presented to the emergency room with complaint of shortness of breath, orthopnea.  He was recently started on Lasix .  He also noted weight gain.  On presentation, he was hypertensive.  Lab work showed creatinine of 1.86, elevated BNP.  Chest x-ray showed pulm vascular congestion.  Started on IV Lasix  and admitted for the management of acute on chronic HFrEF .  Currently appears euvolemic.  Creatinine trended up to the range of 2 so IV Lasix  held.  He is not short of breath, saturating fine on room air.  Does not appear volume overloaded today.  He feels ready to go home.  I have recommended him to follow-up with his PCP and we can do a BMP just to check his kidney function.  He will also follow-up with his nephrologist.   Following problems were addressed during the hospitalization:  Acute on chronic combined systolic/diastolic CHF: Presented with weight gain, orthopnea, dyspnea.  Echo done on 01/01/2024 showed EF of 30 to 35%, grade 2 diastolic dysfunction.  Chest x-ray on presentation showed cardiomegaly with mild edema.  Elevated BNP.  Started on IV Lasix .  Currently appears euvolemic.  Continue Lasix  40 mg that he takes at home.   CKD stage IIIb: Baseline creatinine around 1.8-2.  Currently kidney function close to baseline.  Follows with [facility].  Continue Lasix  40 mg daily  History of V. tach/paroxysmal A-fib: Currently on normal sinus rhythm.  On amiodarone , carvedilol .  On Eliquis   for anticoagulation.   Status post AICD   Hypertension:, Takes valsartan /coreg   at home ,currently blood pressure stable   Hyperlipidemia: On Lipitor   Deconditioning/weakness: PT recommend outpatient follow-up   Constipation: No bowel movement for last 3 to 4 days.  Ordered bowel regimen along with Dulcolax suppository.  Had a good bowel movement on 7/28.   Discharge Diagnoses:  Principal Problem:   Acute on chronic heart failure [facility])    Discharge Instructions  Discharge Instructions     Diet - low sodium heart healthy   Complete by: As directed    Discharge instructions   Complete by: As directed    1)Please take your medications as instructed 2)Follow up with your PCP in a week.  Do a BMP test during the follow-up to check your kidney function 3)Follow up with your nephrologist as an outpatient 4)Please monitor your weight at home.  Restrict fluid intake to less than 1.5 L a day and salt intake to less than 2 g a day   Increase activity slowly   Complete by: As directed       Allergies as of 04/22/2024       Reactions   Contrast Media [iodinated Contrast Media] Shortness Of Breath, Nausea And Vomiting   Lisinopril  Cough        Medication List     TAKE these medications    allopurinol  300 MG tablet Commonly known as: ZYLOPRIM  Take 3 tablets by mouth daily.   amiodarone  200 MG tablet Commonly known as: PACERONE  Take 1 tablet (200 mg total) by mouth daily.   atorvastatin  40  MG tablet Commonly known as: LIPITOR Take 40 mg by mouth daily.   carvedilol  12.5 MG tablet Commonly known as: COREG  TAKE 1 TABLET (12.5MG  TOTAL) BY MOUTH TWICE A DAY WITH MEALS   cholecalciferol  25 MCG (1000 UNIT) tablet Commonly known as: VITAMIN D3 Take 1,000 Units by mouth daily.   Eliquis  5 MG Tabs tablet Generic drug: apixaban  TAKE 1 TABLET BY MOUTH TWICE A DAY   ferrous sulfate 325 (65 FE) MG tablet Take 325 mg by mouth daily.   furosemide  40 MG tablet Commonly  known as: LASIX  Take 1 tablet (40 mg total) by mouth daily.   polyethylene glycol 17 g packet Commonly known as: MIRALAX  / GLYCOLAX  Take 17 g by mouth daily. Start taking on: April 23, 2024   valsartan  40 MG tablet Commonly known as: Diovan  Take 1 tablet (40 mg total) by mouth daily.        Follow-up Information     [name], MD. Schedule an appointment as soon as possible for a visit in 1 week(s).   Specialty: Internal Medicine Contact information: [id number] [place] [address] [phone]                Allergies  Allergen Reactions   Contrast Media [Iodinated Contrast Media] Shortness Of Breath and Nausea And Vomiting   Lisinopril  Cough    Consultations: None   Procedures/Studies: DG Chest 2 View Result Date: 04/20/2024 CLINICAL DATA:  Shortness of breath. EXAM: CHEST - 2 VIEW COMPARISON:  Chest radiograph dated 04/18/2023. FINDINGS: Cardiomegaly with vascular congestion and mild edema. No consolidative changes. No large pleural effusion or pneumothorax. Left pectoral AICD device. No acute osseous pathology. IMPRESSION: Cardiomegaly with mild edema. Electronically Signed   By: Vanetta Chou M.D.   On: 04/20/2024 11:28   DG Chest Portable 1 View Result Date: 04/17/2024 EXAM: 1 VIEW XRAY OF THE CHEST 04/17/2024 03:20:00 AM COMPARISON: 03/31/2024 CLINICAL HISTORY: CHF. Legs and feet swollen. FINDINGS: LUNGS AND PLEURA: No frank interstitial edema. No pleural effusion. No pneumothorax. HEART AND MEDIASTINUM: Cardiomegaly. BONES AND SOFT TISSUES: No acute osseous abnormality. Left subclavian ICD in place. IMPRESSION: 1. Cardiomegaly. 2. No frank interstitial edema. Electronically signed by: Pinkie Pebbles MD 04/17/2024 03:25 AM EDT RP Workstation: [id number]   US  Abdomen Limited Result Date: 04/17/2024 EXAM: Right Upper Quadrant Abdominal Ultrasound 04/17/2024 02:27:06 AM TECHNIQUE: Real-time ultrasonography of the right upper quadrant of the abdomen  was performed. COMPARISON: CT abdomen and pelvis earlier today. CLINICAL HISTORY: Cholelithiasis. FINDINGS: LIVER: Multiple echogenic hepatic lesions measuring up to 4.6 cm, previously characterized as benign hemangiomas on MR. BILIARY SYSTEM: Thick walled, underdistended gallbladder with cholesterolosis, suggesting gallbladder adenomyomatosis. Layering gallstones measuring up to 11 mm. Negative sonographic Murphy's sign. Common duct measures 4 mm. RIGHT KIDNEY: The right kidney is grossly unremarkable in appearances without evidence of hydronephrosis, echogenic calculi or worrisome mass lesions. PANCREAS: Visualized portions of the pancreas are unremarkable. OTHER: No right upper quadrant ascites. IMPRESSION: 1. Cholelithiasis, without convincing sonographic findings to suggest acute cholecystitis. 2. 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 3. Benign hepatic hemangiomas, better evaluated on prior MR. Electronically signed by: Pinkie Pebbles MD 04/17/2024 02:35 AM EDT RP Workstation: [id number]   CT Renal Stone Study Result Date: 04/17/2024 CLINICAL DATA:  Upper abdominal pain. EXAM: CT ABDOMEN AND PELVIS WITHOUT CONTRAST TECHNIQUE: Multidetector CT imaging of the abdomen and pelvis was performed following the standard protocol without IV contrast. RADIATION DOSE REDUCTION: This exam was performed according to the departmental dose-optimization program which includes automated exposure  control, adjustment of the mA and/or kV according to patient size and/or use of iterative reconstruction technique. COMPARISON:  April 01, 2024 FINDINGS: Lower chest: There is mild cardiomegaly. A small right pleural effusion is seen. Hepatobiliary: No focal liver abnormality is seen. A few subcentimeter gallstones are seen. Mild gallbladder wall thickening is suspected. There is no evidence of biliary dilatation. Pancreas: Unremarkable. No pancreatic ductal dilatation or surrounding inflammatory changes. Spleen: Normal in size  without focal abnormality. Adrenals/Urinary Tract: Adrenal glands are unremarkable. Kidneys are normal in size, without renal calculi or hydronephrosis. A small stable subcentimeter renal cyst is seen within the lower pole of the left kidney. The urinary bladder is poorly distended and subsequently limited in evaluation. Moderate severity diffuse urinary bladder wall thickening is noted. Stomach/Bowel: Stomach is within normal limits. Appendix appears normal. No evidence of bowel wall thickening, distention, or inflammatory changes. Noninflamed diverticula are seen throughout the descending and sigmoid colon. Vascular/Lymphatic: Aortic atherosclerosis. No enlarged abdominal or pelvic lymph nodes. Reproductive: The prostate gland is mildly enlarged with moderate to marked severity prostate gland calcifications. Other: Moderate severity anasarca is seen throughout the abdominal and pelvic walls. A mild amount of free fluid is seen within the left upper quadrant. Musculoskeletal: Multilevel degenerative changes are present throughout the lumbar spine. IMPRESSION: 1. Small right pleural effusion. 2. Cholelithiasis with mild gallbladder wall thickening. Further evaluation with right upper quadrant ultrasound is recommended if acute cholecystitis is of clinical concern. 3. Colonic diverticulosis. 4. Moderate severity diffuse urinary bladder wall thickening which may be secondary to cystitis. Correlation with urinalysis is recommended. 5. Mild amount of left upper quadrant free fluid. 6. Moderate severity anasarca. 7. Aortic atherosclerosis. Electronically Signed   By: Suzen Dials M.D.   On: 04/17/2024 00:27   US  Abdomen Limited RUQ (LIVER/GB) Result Date: 04/01/2024 CLINICAL DATA:  Right upper quadrant abdominal pain for 2 days. EXAM: ULTRASOUND ABDOMEN LIMITED RIGHT UPPER QUADRANT COMPARISON:  CT abdomen and pelvis 04/01/2024 FINDINGS: Gallbladder: Cholelithiasis with stones measuring up to 1.2 cm. Gallbladder  wall thickening with pericholecystic edema. Murphy's sign is negative. Small non dependent filling defect in the gallbladder wall measuring 3 mm diameter with ring down artifact. This is likely a small polyp or cholesterolosis. No imaging follow-up for this lesion is indicated. Common bile duct: Diameter: 3.3 mm, normal Liver: Multiple focal liver lesions are identified including a right lobe lesion measuring 6.1 cm diameter, a left lobe lesion measuring 4.4 cm diameter, another left lobe lesion measuring 2.5 cm diameter. Lesions are well-circumscribed and homogeneously hyperechoic consistent with ultrasound appearance of cavernous hemangiomas. These lesions have been previously evaluated at MRI with findings consistent with cavernous hemangiomas. Portal vein is patent on color Doppler imaging with normal direction of blood flow towards the liver. Other: None. IMPRESSION: 1. Cholelithiasis with gallbladder wall thickening and pericholecystic edema. Murphy's sign is negative. 2. 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 Electronically Signed   By: Elsie Gravely M.D.   On: 04/01/2024 02:43   CT ABDOMEN PELVIS WO CONTRAST Result Date: 04/01/2024 CLINICAL DATA:  Weakness. Possible free air on chest radiograph. History of CHF. EXAM: CT ABDOMEN AND PELVIS WITHOUT CONTRAST TECHNIQUE: Multidetector CT imaging of the abdomen and pelvis was performed following the standard protocol without IV contrast. RADIATION DOSE REDUCTION: This exam was performed according to the departmental dose-optimization program which includes automated exposure control, adjustment of the mA and/or kV according to patient size and/or use of iterative reconstruction technique. COMPARISON:  Chest radiograph  03/31/2024. CT chest abdomen pelvis 06/29/2020 FINDINGS: Lower chest: Mild degenerative changes in the lung bases. Cardiac enlargement. Hepatobiliary: Liver is enlarged. Poorly visualized mildly low-attenuation  lesions in the liver including a right lobe lesion measuring about 8.8 cm diameter and a lateral segment left lobe lesion measuring about 5.4 cm diameter. These lesions have been present previously and or evaluated MRI on 09/19/2018 with findings consistent with cavernous hemangiomas. Cholelithiasis with small stones in the gallbladder. Gallbladder wall thickening is suggested. Changes could indicate acute cholecystitis in the appropriate clinical setting. No bile duct dilatation. Pancreas: Unremarkable. No pancreatic ductal dilatation or surrounding inflammatory changes. Spleen: Normal in size without focal abnormality. Adrenals/Urinary Tract: Adrenal glands are unremarkable. Kidneys are normal, without renal calculi, focal lesion, or hydronephrosis. Bladder wall is mildly thickened. This may be due to outlet obstruction or cystitis. Correlate with urinalysis. Stomach/Bowel: Stomach, small bowel, and colon are not abnormally distended. No wall thickening or inflammatory changes. Sigmoid colonic diverticulosis without evidence of acute diverticulitis. Appendix is normal. Vascular/Lymphatic: Calcification of the aorta. No aneurysm. No significant lymphadenopathy. Reproductive: Prostate gland is not enlarged. Other: No free air or free fluid in the abdomen. The finding of gas in the right upper quadrant on prior chest radiograph results from colonic interposition anterior to the liver. Musculoskeletal: Degenerative changes in the spine and hips. No acute bony abnormalities. IMPRESSION: 1. No free air in the abdomen. Finding gas in the right upper quadrant on prior chest radiograph results from colonic interposition anterior to the liver. 2. Cholelithiasis with gallbladder wall thickening and edema. This may indicate acute cholecystitis in the appropriate clinical setting. 3. Stable appearance of liver lesions, previously demonstrated to represent cavernous hemangiomas. These are not well demonstrated on noncontrast  imaging. 4. Aortic atherosclerosis. 5. Colonic diverticulosis without evidence of acute diverticulitis. 6. Bladder wall is mildly thickened, possibly due to outlet obstruction or cystitis. Correlate with urinalysis. 7. Cardiac enlargement. Electronically Signed   By: Elsie Gravely M.D.   On: 04/01/2024 01:33   DG Chest 2 View Result Date: 03/31/2024 CLINICAL DATA:  Generalized weakness EXAM: CHEST - 2 VIEW COMPARISON:  CT 06/29/2020 FINDINGS: Left-sided single lead pacing device. Cardiomegaly with vascular congestion and probable small right effusion. Aortic atherosclerosis. No pneumothorax. Tubular air containing structure beneath the right diaphragm with air containing bowel in the right upper quadrant. IMPRESSION: 1. Cardiomegaly with vascular congestion and probable small right effusion. 2. Tubular air containing structure beneath the right diaphragm, suspect air-filled high-riding loop of bowel but given change in appearance compared to prior radiographs, free air not excluded and suggest CT for further evaluation. Electronically Signed   By: Luke Bun M.D.   On: 03/31/2024 17:19      Subjective: Patient seen and examined at bedside today.  Hemodynamically stable.  Overall comfortable.  Sitting at the edge of the bed.  On room air.  Denies any worsening shortness of breath or cough.  He does not have any peripheral edema.  Patient wanted to check his vitamin B12 level before he goes home.  Medically stable for discharge  Discharge Exam: Vitals:   04/22/24 0435 04/22/24 0725  BP: (!) 126/59 134/60  Pulse: 60 60  Resp: 13 17  Temp: 97.7 F (36.5 C) 97.7 F (36.5 C)  SpO2: 97% 99%   Vitals:   04/21/24 2010 04/22/24 0030 04/22/24 0435 04/22/24 0725  BP: (!) 125/52 (!) 135/58 (!) 126/59 134/60  Pulse: 60 60 60 60  Resp: 20 19 13 17   Temp: ROLLEN)  97.5 F (36.4 C) 97.9 F (36.6 C) 97.7 F (36.5 C) 97.7 F (36.5 C)  TempSrc: Oral Oral Oral Oral  SpO2: 99% 100% 97% 99%  Weight:    89.9 kg   Height:        General: Pt is alert, awake, not in acute distress Cardiovascular: RRR, S1/S2 +, no rubs, no gallops Respiratory: CTA bilaterally, no wheezing, no rhonchi Abdominal: Soft, NT, ND, bowel sounds + Extremities: no edema, no cyanosis    The results of significant diagnostics from this hospitalization (including imaging, microbiology, ancillary and laboratory) are listed below for reference.     Microbiology: Recent Results (from the past 240 hours)  Resp panel by RT-PCR (RSV, Flu A&B, Covid) Anterior Nasal Swab     Status: None   Collection Time: 04/20/24 10:22 AM   Specimen: Anterior Nasal Swab  Result Value Ref Range Status   SARS Coronavirus 2 by RT PCR NEGATIVE NEGATIVE Final   Influenza A by PCR NEGATIVE NEGATIVE Final   Influenza B by PCR NEGATIVE NEGATIVE Final    Comment: (NOTE) The Xpert Xpress SARS-CoV-2/FLU/RSV plus assay is intended as an aid in the diagnosis of influenza from Nasopharyngeal swab specimens and should not be used as a sole basis for treatment. Nasal washings and aspirates are unacceptable for Xpert Xpress SARS-CoV-2/FLU/RSV testing.  Fact Sheet for Patients: BloggerCourse.com  Fact Sheet for Healthcare Providers: SeriousBroker.it  This test is not yet approved or cleared by the [place]  FDA and has been authorized for detection and/or diagnosis of SARS-CoV-2 by FDA under an Emergency Use Authorization (EUA). This EUA will remain in effect (meaning this test can be used) for the duration of the COVID-19 declaration under Section 564(b)(1) of the Act, 21 U.S.C. section 360bbb-3(b)(1), unless the authorization is terminated or revoked.     Resp Syncytial Virus by PCR NEGATIVE NEGATIVE Final    Comment: (NOTE) Fact Sheet for Patients: BloggerCourse.com  Fact Sheet for Healthcare Providers: SeriousBroker.it  This test  is not yet approved or cleared by the [place]  FDA and has been authorized for detection and/or diagnosis of SARS-CoV-2 by FDA under an Emergency Use Authorization (EUA). This EUA will remain in effect (meaning this test can be used) for the duration of the COVID-19 declaration under Section 564(b)(1) of the Act, 21 U.S.C. section 360bbb-3(b)(1), unless the authorization is terminated or revoked.  Performed at [facility], [address] [place], [place] [address]      Labs: BNP (last 3 results) Recent Labs    03/31/24 1618 04/17/24 0337 04/20/24 1052  BNP 1,652.3* 1,463.6* 2,103.7*   Basic Metabolic Panel: Recent Labs  Lab 04/16/24 2252 04/20/24 1052 04/21/24 0256 04/22/24 0226  NA 138 138 137 134*  K 3.9 4.2 4.1 3.9  CL 102 104 103 100  CO2 25 22 23 26   GLUCOSE 125* 101* 96 126*  BUN 36* 34* 37* 43*  CREATININE 1.59* 1.86* 1.93* 2.17*  CALCIUM  10.0 9.8 9.8 9.7   Liver Function Tests: Recent Labs  Lab 04/16/24 2252  AST 27  ALT 23  ALKPHOS 80  BILITOT 1.6*  PROT 7.6  ALBUMIN  3.8   Recent Labs  Lab 04/16/24 2252  LIPASE 28   No results for input(s): AMMONIA in the last 168 hours. CBC: Recent Labs  Lab 04/16/24 2252 04/20/24 1052  WBC 8.3 9.1  NEUTROABS 5.9 6.6  HGB 11.2* 10.8*  HCT 37.4* 35.4*  MCV 92.3 93.2  PLT 170 136*   Cardiac  Enzymes: No results for input(s): CKTOTAL, CKMB, CKMBINDEX, TROPONINI in the last 168 hours. BNP: Invalid input(s): POCBNP CBG: No results for input(s): GLUCAP in the last 168 hours. D-Dimer No results for input(s): DDIMER in the last 72 hours. Hgb A1c No results for input(s): HGBA1C in the last 72 hours. Lipid Profile No results for input(s): CHOL, HDL, LDLCALC, TRIG, CHOLHDL, LDLDIRECT in the last 72 hours. Thyroid function studies No results for input(s): TSH, T4TOTAL, T3FREE, THYROIDAB in the last 72 hours.  Invalid input(s): FREET3 Anemia work up No  results for input(s): VITAMINB12, FOLATE, FERRITIN, TIBC, IRON, RETICCTPCT in the last 72 hours. Urinalysis    Component Value Date/Time   COLORURINE YELLOW 04/16/2024 2314   APPEARANCEUR CLEAR 04/16/2024 2314   LABSPEC 1.016 04/16/2024 2314   PHURINE 5.0 04/16/2024 2314   GLUCOSEU NEGATIVE 04/16/2024 2314   HGBUR NEGATIVE 04/16/2024 2314   BILIRUBINUR NEGATIVE 04/16/2024 2314   KETONESUR NEGATIVE 04/16/2024 2314   PROTEINUR 100 (A) 04/16/2024 2314   NITRITE NEGATIVE 04/16/2024 2314   LEUKOCYTESUR TRACE (A) 04/16/2024 2314   Sepsis Labs Recent Labs  Lab 04/16/24 2252 04/20/24 1052  WBC 8.3 9.1   Microbiology Recent Results (from the past 240 hours)  Resp panel by RT-PCR (RSV, Flu A&B, Covid) Anterior Nasal Swab     Status: None   Collection Time: 04/20/24 10:22 AM   Specimen: Anterior Nasal Swab  Result Value Ref Range Status   SARS Coronavirus 2 by RT PCR NEGATIVE NEGATIVE Final   Influenza A by PCR NEGATIVE NEGATIVE Final   Influenza B by PCR NEGATIVE NEGATIVE Final    Comment: (NOTE) The Xpert Xpress SARS-CoV-2/FLU/RSV plus assay is intended as an aid in the diagnosis of influenza from Nasopharyngeal swab specimens and should not be used as a sole basis for treatment. Nasal washings and aspirates are unacceptable for Xpert Xpress SARS-CoV-2/FLU/RSV testing.  Fact Sheet for Patients: BloggerCourse.com  Fact Sheet for Healthcare Providers: SeriousBroker.it  This test is not yet approved or cleared by the [place]  FDA and has been authorized for detection and/or diagnosis of SARS-CoV-2 by FDA under an Emergency Use Authorization (EUA). This EUA will remain in effect (meaning this test can be used) for the duration of the COVID-19 declaration under Section 564(b)(1) of the Act, 21 U.S.C. section 360bbb-3(b)(1), unless the authorization is terminated or revoked.     Resp Syncytial Virus by PCR  NEGATIVE NEGATIVE Final    Comment: (NOTE) Fact Sheet for Patients: BloggerCourse.com  Fact Sheet for Healthcare Providers: SeriousBroker.it  This test is not yet approved or cleared by the [place]  FDA and has been authorized for detection and/or diagnosis of SARS-CoV-2 by FDA under an Emergency Use Authorization (EUA). This EUA will remain in effect (meaning this test can be used) for the duration of the COVID-19 declaration under Section 564(b)(1) of the Act, 21 U.S.C. section 360bbb-3(b)(1), unless the authorization is terminated or revoked.  Performed at [facility], [address] [place], [place] [address]     Please note: You were cared for by a hospitalist during your hospital stay. Once you are discharged, your primary care physician will handle any further medical issues. Please note that NO REFILLS for any discharge medications will be authorized once you are discharged, as it is imperative that you return to your primary care physician (or establish a relationship with a primary care physician if you do not have one) for your post hospital discharge needs so that they can reassess  your need for medications and monitor your lab values.    Time coordinating discharge: 40 minutes  SIGNED:   Ivonne Mustache, MD  [facility] 04/22/2024, 10:35 AM Pager [phone]  If 7PM-7AM, please contact night-coverage [email or web address] Password [email or web address]

## 2024-04-22 NOTE — Plan of Care (Signed)

## 2024-04-28 ENCOUNTER — Ambulatory Visit: Attending: Cardiology

## 2024-04-28 DIAGNOSIS — Z95810 Presence of automatic (implantable) cardiac defibrillator: Secondary | ICD-10-CM | POA: Diagnosis not present

## 2024-04-28 DIAGNOSIS — I5022 Chronic systolic (congestive) heart failure: Secondary | ICD-10-CM

## 2024-04-28 NOTE — Progress Notes (Unsigned)
 EPIC Encounter for ICM Monitoring  Patient Name: Dean Attridge Sr. is a 73 y.o. male Date: 04/28/2024 Primary Care Physican: [name], MD Primary Cardiologist: [facility] Electrophysiologist: Inocencio Nephrologist:  [facility] 01/31/2023 Weight:   183 lbs 06/21/2023 Weight: 183 lbs 07/24/2023 Weight: 183-184 lbs 11/20/2023 Hospital Discharge Weight: 190 lbs 12/25/2023 Weight: 182 lbs 02/06/2024 Office Weight: 191 lbs 03/04/2024 Weight: 188 lbs  04/20/2024 Hospital Weight: 165 lbs   Clinical Status Since 03-Mar-2024 Time in AF   0.0 hr/day (0.0%)(taking Eliquis )   Spoke with patient and heart failure questions reviewed.  Transmission results reviewed.  Pt reports swelling of feet which did improve slightly after 7/27 hospitalization.   He has a UTI and is sitting in the PCP office for follow up.  ED visits on 7/7 & 7/23.   Hospitalized 7/27-7/29 for management of acute on chronic HFrEF per discharge summary note.   Diet: He is not strict with limiting salt but eats restaurant foods a couple of times a month.   Optivol Thoracic impedance suggesting intermittent fluid accumulation continues since 5/23 but trending back to baseline following diuresis at 7/27 hospitalization.  Fluid index trending greater than normal threshold starting 6/7.   Prescribed:  Furosemide  40 mg Take 1 tablet (40 mg total) by mouth daily.      Labs: 04/22/2024 Creatinine 2.17, BUN 43, Potassium 3.9, Sodium 134, GFR 31  04/21/2024 Creatinine 1.93, BUN 37, Potassium 4.1, Sodium 137, GFR 36  04/20/2024 Creatinine 1.86, BUN 34, Potassium 4.2, Sodium 138, GFR 38  04/16/2024 Creatinine 1.59, BUN 36, Potassium 3.9, Sodium 138, GFR 45 03/31/2024 Creatinine 1.77, BUN 39, Potassium 3.6, Sodium 136, GFR 40  A complete set of results can be found in Results Review.   Recommendations:   No changes and encouraged to call if experiencing any fluid symptoms.   Follow-up plan: ICM clinic phone appointment on 05/12/2024 to  recheck fluid level prior to 8/21 Cardiology visit.   91 day device clinic remote transmission 05/15/2024.     EP/Cardiology Office Visits:   05/15/2024 with Katlyn West, NP for hospital follow up.  Recall 07/01/2024 with Dr Inocencio.   Copy of ICM check sent to Dr. Inocencio.  3 month ICM trend: 04/28/2024.    12-14 Month ICM trend:     Mitzie GORMAN Garner, RN 04/28/2024 8:32 AM

## 2024-05-01 ENCOUNTER — Other Ambulatory Visit: Payer: Self-pay | Admitting: Cardiology

## 2024-05-01 NOTE — Telephone Encounter (Signed)
 Prescription refill request for Eliquis  received. Indication: afib  Last office visit: Hilty, 02/06/2024 Scr: 2.17, 04/22/2024 Age: 73 yo  Weight: 89.9 kg   Refill sent.

## 2024-05-02 ENCOUNTER — Other Ambulatory Visit: Payer: Self-pay

## 2024-05-02 ENCOUNTER — Emergency Department (HOSPITAL_COMMUNITY)
Admission: EM | Admit: 2024-05-02 | Discharge: 2024-05-02 | Disposition: A | Discharge status: Home / Self Care | Source: Home / Self Care | Attending: Emergency Medicine | Admitting: Emergency Medicine

## 2024-05-02 ENCOUNTER — Encounter (HOSPITAL_COMMUNITY): Payer: Self-pay | Admitting: *Deleted

## 2024-05-02 ENCOUNTER — Emergency Department (HOSPITAL_COMMUNITY)

## 2024-05-02 DIAGNOSIS — N390 Urinary tract infection, site not specified: Secondary | ICD-10-CM | POA: Insufficient documentation

## 2024-05-02 DIAGNOSIS — I110 Hypertensive heart disease with heart failure: Secondary | ICD-10-CM | POA: Insufficient documentation

## 2024-05-02 DIAGNOSIS — Z7901 Long term (current) use of anticoagulants: Secondary | ICD-10-CM | POA: Insufficient documentation

## 2024-05-02 DIAGNOSIS — I5022 Chronic systolic (congestive) heart failure: Secondary | ICD-10-CM | POA: Insufficient documentation

## 2024-05-02 DIAGNOSIS — Z79899 Other long term (current) drug therapy: Secondary | ICD-10-CM | POA: Insufficient documentation

## 2024-05-02 DIAGNOSIS — I130 Hypertensive heart and chronic kidney disease with heart failure and stage 1 through stage 4 chronic kidney disease, or unspecified chronic kidney disease: Secondary | ICD-10-CM | POA: Diagnosis not present

## 2024-05-02 DIAGNOSIS — R600 Localized edema: Secondary | ICD-10-CM | POA: Insufficient documentation

## 2024-05-02 DIAGNOSIS — I509 Heart failure, unspecified: Secondary | ICD-10-CM

## 2024-05-02 DIAGNOSIS — I5043 Acute on chronic combined systolic (congestive) and diastolic (congestive) heart failure: Secondary | ICD-10-CM | POA: Diagnosis not present

## 2024-05-02 LAB — COMPREHENSIVE METABOLIC PANEL WITH GFR
ALT: 23 U/L (ref 0–44)
AST: 23 U/L (ref 15–41)
Albumin: 3.3 g/dL — ABNORMAL LOW (ref 3.5–5.0)
Alkaline Phosphatase: 65 U/L (ref 38–126)
Anion gap: 11 (ref 5–15)
BUN: 25 mg/dL — ABNORMAL HIGH (ref 8–23)
CO2: 21 mmol/L — ABNORMAL LOW (ref 22–32)
Calcium: 9.7 mg/dL (ref 8.9–10.3)
Chloride: 106 mmol/L (ref 98–111)
Creatinine, Ser: 1.72 mg/dL — ABNORMAL HIGH (ref 0.61–1.24)
GFR, Estimated: 41 mL/min — ABNORMAL LOW (ref >60)
Glucose, Bld: 101 mg/dL — ABNORMAL HIGH (ref 70–99)
Potassium: 4.5 mmol/L (ref 3.5–5.1)
Sodium: 138 mmol/L (ref 135–145)
Total Bilirubin: 1.2 mg/dL (ref 0.0–1.2)
Total Protein: 6.7 g/dL (ref 6.5–8.1)

## 2024-05-02 LAB — URINALYSIS, W/ REFLEX TO CULTURE (INFECTION SUSPECTED)
Bilirubin Urine: NEGATIVE
Glucose, UA: NEGATIVE mg/dL
Ketones, ur: NEGATIVE mg/dL
Nitrite: NEGATIVE
Protein, ur: 30 mg/dL — AB
Specific Gravity, Urine: 1.011 (ref 1.005–1.030)
WBC, UA: >50 WBC/hpf (ref 0–5)
pH: 5 (ref 5.0–8.0)

## 2024-05-02 LAB — RESP PANEL BY RT-PCR (RSV, FLU A&B, COVID)  RVPGX2
Influenza A by PCR: NEGATIVE
Influenza B by PCR: NEGATIVE
Resp Syncytial Virus by PCR: NEGATIVE
SARS Coronavirus 2 by RT PCR: NEGATIVE

## 2024-05-02 LAB — CBC WITH DIFFERENTIAL/PLATELET
Abs Immature Granulocytes: 0.04 K/uL (ref 0.00–0.07)
Basophils Absolute: 0.1 K/uL (ref 0.0–0.1)
Basophils Relative: 1 %
Eosinophils Absolute: 0.2 K/uL (ref 0.0–0.5)
Eosinophils Relative: 3 %
HCT: 36.1 % — ABNORMAL LOW (ref 39.0–52.0)
Hemoglobin: 11.1 g/dL — ABNORMAL LOW (ref 13.0–17.0)
Immature Granulocytes: 1 %
Lymphocytes Relative: 13 %
Lymphs Abs: 1.1 K/uL (ref 0.7–4.0)
MCH: 28.8 pg (ref 26.0–34.0)
MCHC: 30.7 g/dL (ref 30.0–36.0)
MCV: 93.8 fL (ref 80.0–100.0)
Monocytes Absolute: 1 K/uL (ref 0.1–1.0)
Monocytes Relative: 11 %
Neutro Abs: 6.2 K/uL (ref 1.7–7.7)
Neutrophils Relative %: 71 %
Platelets: 149 K/uL — ABNORMAL LOW (ref 150–400)
RBC: 3.85 MIL/uL — ABNORMAL LOW (ref 4.22–5.81)
RDW: 16.6 % — ABNORMAL HIGH (ref 11.5–15.5)
WBC: 8.6 K/uL (ref 4.0–10.5)
nRBC: 0 % (ref 0.0–0.2)

## 2024-05-02 LAB — TROPONIN I (HIGH SENSITIVITY)
Troponin I (High Sensitivity): 25 ng/L — ABNORMAL HIGH (ref <18)
Troponin I (High Sensitivity): 22 ng/L — ABNORMAL HIGH (ref <18)

## 2024-05-02 MED ORDER — GUAIFENESIN-CODEINE 100-10 MG/5ML PO SOLN: 5.0000 mL | Freq: Every evening | ORAL | 0 refills | Status: DC | PRN

## 2024-05-02 MED ORDER — FUROSEMIDE 10 MG/ML IJ SOLN
40.0000 mg | Freq: Once | INTRAMUSCULAR | Status: AC
  Administered 2024-05-02: 40 mg via INTRAVENOUS
  Filled 2024-05-02: qty 4

## 2024-05-02 MED ORDER — FUROSEMIDE 40 MG PO TABS: 40.0000 mg | ORAL_TABLET | Freq: Every day | ORAL | 0 refills | Status: DC

## 2024-05-02 NOTE — ED Provider Notes (Signed)
  Physical Exam  BP (!) 120/45   Pulse 67   Temp 98.9 F (37.2 C)   Resp 15   Ht 5' 6 (1.676 m)   Wt 88.5 kg   SpO2 98%   BMI 31.47 kg/m   Physical Exam  Procedures  Procedures  ED Course / MDM   Clinical Course as of 05/02/24 0931  Fri May 02, 2024  0634 I evaluated at bedside. 73 year old male present with a chief complaint of shortness of breath.  Expansive history of similar, heart failure and CKD combo. Lasix  recently decreased in the outpatient setting.  He is had decreased urinary output.  He has been describing some difficulty with incontinence/overflow incontinence as well as some dysuria.  Diagnosed as UTI though PCP was uncertain about diagnosis at this time. Patient denies fevers chills nausea vomiting syncope shortness of breath at this time. Significant orthopnea is the main reason for his visit today. Will treat with IV Lasix  40 mg based on current chest x-ray for aggressive diuresis today based on his presentation. Anticipate the patient can go home on alternating 20/40 mg of Lasix . He is pending a bladder scan for need for treatment for urinary retention. [CC]  9295 Assumed care from Dr Jerral. 73 yo M with hx of CHF who presents with volume overload. Will need diuresis and bladder scan since he feels like he is having incomplete emptying. Is being treated for UTI got rocephin  yesterday and is on vantin . May potentially need admission for abx.  Says he is currently taking 20 mg of Lasix  daily.  Currently is well-appearing.  Normal work of breathing on room air.  Does have significant lower extremity edema. [RP]  [id number] Bladder scan was 20 mL of urine. [RP]  0930 Patient reassessed.  Still is doing well.  Was given Lasix  and has had significant urine output.  Says that his dysuria has resolved.  Urinalysis does have some pyuria with rare bacteria.  Says he has not had any flank pain or fevers.  Thinks that he is doing somewhat better since getting the Rocephin  shot  yesterday.  Urine culture was sent here.  Will have him continue his Vantin  and increased dose of his Lasix  for the next 5 days.  Follow-up with his primary doctor in several days to make sure he is doing well. [RP]    Clinical Course User Index [CC] Jerral Meth, MD [RP] Dean Lamar BROCKS, MD   Medical Decision Making Amount and/or Complexity of Data Reviewed Labs: ordered. Radiology: ordered.  Risk OTC drugs. Prescription drug management.      Dean Lamar BROCKS, MD 05/02/24 [phone]

## 2024-05-02 NOTE — ED Notes (Signed)
 Introduced self to patient at this time. Patient is resting in bed with visible chest rise and fall. The call light is in reach. There are no further requests at this time.

## 2024-05-02 NOTE — ED Provider Notes (Signed)
 [place] EMERGENCY DEPARTMENT AT [facility] Provider Note   CSN: [id number] Arrival date & time: 05/02/24  9561     Patient presents with: Shortness of Breath   Dean Korn Sr. is a 73 y.o. male.   73 year old male presents to the ED with complaints of shortness of breath.  Patient history pertinent for history of V. tach status post ICD placement, paroxysmal A-fib on Eliquis , HFrEF with EF of 30 to 35%, hypertension, ischemic cardiomyopathy, hyperlipidemia. Patient advises he has been experiencing shortness of breath for several days.  Shortness of breath only occurs when laying down and he reports swelling in his lower extremities.  He recently was lowered on his dose of Lasix  from 40 mg to 20 mg due to concern for decreased kidney function.  He has also been dealing with a UTI for roughly 1 month and he is on his second course of antibiotics which he started the new one today from PCP.  He is complaining of associated productive cough with clear mucus, feeling of incomplete emptying with urination, and lower extremity pitting edema.  Patient denies chest pain, fever, constipation, diarrhea, nausea, vomiting.  Patient is not currently complaining of shortness of breath reports it only occurs when laying flat.  Patient was recently admitted July 27 acute on chronic CHF exacerbation.   Shortness of Breath Associated symptoms: no abdominal pain, no chest pain, no cough, no diaphoresis, no fever, no headaches, no vomiting and no wheezing        Prior to Admission medications   Medication Sig Start Date End Date Taking? Authorizing Provider  allopurinol  (ZYLOPRIM ) 300 MG tablet Take 3 tablets by mouth daily. 06/13/23   [provider]  amiodarone  (PACERONE ) 200 MG tablet Take 1 tablet (200 mg total) by mouth daily. 09/06/22   Ursuy, Renee Lynn, PA-C  apixaban  (ELIQUIS ) 5 MG TABS tablet TAKE 1 TABLET BY MOUTH TWICE A DAY 05/01/24   Camnitz, Soyla Lunger, MD  atorvastatin   (LIPITOR) 40 MG tablet Take 40 mg by mouth daily.    [provider]  carvedilol  (COREG ) 12.5 MG tablet TAKE 1 TABLET (12.5MG  TOTAL) BY MOUTH TWICE A DAY WITH MEALS 08/28/23   Leverne Charlies Helling, PA-C  cholecalciferol  (VITAMIN D3) 25 MCG (1000 UNIT) tablet Take 1,000 Units by mouth daily. 06/13/23   [provider]  ferrous sulfate 325 (65 FE) MG tablet Take 325 mg by mouth daily. 11/08/23   [provider]  furosemide  (LASIX ) 20 MG tablet TAKE 1 TABLET BY MOUTH EVERY DAY 05/01/24   Hilty, Vinie BROCKS, MD  furosemide  (LASIX ) 40 MG tablet Take 1 tablet (40 mg total) by mouth daily. 04/22/24   Adhikari, Amrit, MD  polyethylene glycol (MIRALAX  / GLYCOLAX ) 17 g packet Take 17 g by mouth daily. 04/23/24   Jillian Buttery, MD  valsartan  (DIOVAN ) 40 MG tablet Take 1 tablet (40 mg total) by mouth daily. 11/27/23   Vicci Rollo SAUNDERS, PA-C    Allergies: Contrast media [iodinated contrast media] and Lisinopril     Review of Systems  Constitutional:  Negative for chills, diaphoresis, fatigue and fever.  HENT:  Negative for congestion.   Respiratory:  Positive for shortness of breath. Negative for cough, chest tightness and wheezing.   Cardiovascular:  Positive for leg swelling. Negative for chest pain.  Gastrointestinal:  Positive for abdominal distention. Negative for abdominal pain, constipation, diarrhea, nausea and vomiting.  Genitourinary:  Positive for decreased urine volume, difficulty urinating and dysuria. Negative for flank pain.  Musculoskeletal: Negative.   Skin:  Negative for color change and wound.  Neurological:  Negative for dizziness, weakness, light-headedness, numbness and headaches.    Updated Vital Signs BP (!) 125/49   Pulse 65   Temp 98.9 F (37.2 C)   Resp (!) 28   Ht 5' 6 (1.676 m)   Wt 88.5 kg   SpO2 98%   BMI 31.47 kg/m   Physical Exam Constitutional:      Appearance: He is well-developed.  HENT:     Head: Normocephalic and atraumatic.   Cardiovascular:     Rate and Rhythm: Normal rate.  Pulmonary:     Effort: Pulmonary effort is normal.     Breath sounds: Normal breath sounds.  Abdominal:     Palpations: Abdomen is soft.  Musculoskeletal:     Right lower leg: 2+ Edema present.     Left lower leg: 2+ Edema present.     Right ankle: Swelling present.     Left ankle: Swelling present.     Right foot: Swelling present.     Left foot: Swelling present.  Skin:    General: Skin is warm and dry.  Neurological:     General: No focal deficit present.     Mental Status: He is alert and oriented to person, place, and time.     (all labs ordered are listed, but only abnormal results are displayed) Labs Reviewed  CBC WITH DIFFERENTIAL/PLATELET - Abnormal; Notable for the following components:      Result Value   RBC 3.85 (*)    Hemoglobin 11.1 (*)    HCT 36.1 (*)    RDW 16.6 (*)    Platelets 149 (*)    All other components within normal limits  COMPREHENSIVE METABOLIC PANEL WITH GFR - Abnormal; Notable for the following components:   CO2 21 (*)    Glucose, Bld 101 (*)    BUN 25 (*)    Creatinine, Ser 1.72 (*)    Albumin  3.3 (*)    GFR, Estimated 41 (*)    All other components within normal limits  TROPONIN I (HIGH SENSITIVITY) - Abnormal; Notable for the following components:   Troponin I (High Sensitivity) 25 (*)    All other components within normal limits  RESP PANEL BY RT-PCR (RSV, FLU A&B, COVID)  [id number]  URINALYSIS, W/ REFLEX TO CULTURE (INFECTION SUSPECTED)    EKG: None  Radiology: DG Chest Portable 1 View Result Date: 05/02/2024 CLINICAL DATA:  Shortness of breath. EXAM: PORTABLE CHEST 1 VIEW COMPARISON:  04/20/2024 FINDINGS: The cardio pericardial silhouette is enlarged. There is pulmonary vascular congestion without overt pulmonary edema. The lungs are clear without focal pneumonia, edema, pneumothorax or pleural effusion. Left-sided single lead pacer/AICD noted. Telemetry leads overlie the chest.  IMPRESSION: Enlargement of the cardiopericardial silhouette with pulmonary vascular congestion. No overt pulmonary edema on the current study. Electronically Signed   By: Camellia Candle M.D.   On: 05/02/2024 05:29     Procedures   Medications Ordered in the ED  furosemide  (LASIX ) injection 40 mg (has no administration in time range)    Clinical Course as of 05/02/24 0645  Fri May 02, 2024  0634 I evaluated at bedside. 73 year old male present with a chief complaint of shortness of breath.  Expansive history of similar, heart failure and CKD combo. Lasix  recently decreased in the outpatient setting.  He is had decreased urinary output.  He has been describing some difficulty with incontinence/overflow incontinence as well as  some dysuria.  Diagnosed as UTI though PCP was uncertain about diagnosis at this time. Patient denies fevers chills nausea vomiting syncope shortness of breath at this time. Significant orthopnea is the main reason for his visit today. Will treat with IV Lasix  40 mg based on current chest x-ray for aggressive diuresis today based on his presentation. Anticipate the patient can go home on alternating 20/40 mg of Lasix . He is pending a bladder scan for need for treatment for urinary retention. [CC]    Clinical Course User Index [CC] Jerral Meth, MD                                Medical Decision Making 73 y.o. male presents to the ED for concern of Shortness of Breath and CHF exacerbation with lower extremity pitting edema and orthopnea.     This involves an extensive number of treatment options, and is a complaint that carries with it a high risk of complications and morbidity.  The emergent differential diagnosis prior to evaluation includes, but is not limited to: Acute on chronic urinary CHF exacerbation, ACS, pneumonia, sepsis, tamponade, dysrhythmia.   This is not an exhaustive differential.   Past Medical History / Co-morbidities / Social History: Hx of   history of V. tach status post ICD placement, paroxysmal A-fib on Eliquis , HFrEF with EF of 30 to 35%, hypertension, ischemic cardiomyopathy, hyperlipidemia  Patient reports being treated for UTI for roughly 1 month through PCP and has started a new antibiotic today.  Additional History:  Obtained by chart review.  Notably recent hospital admission for acute on chronic congestive heart failure exacerbation.  No UTI noted on admission.  Lab Tests: CMP has improved kidney function from last CMP.  No other acute abnormalities were noted.  Pending results for respiratory panel and UA at sign out  Imaging Studies: I ordered imaging studies including chest x-ray which radiologist interpreted Enlargement of the cardiopericardial silhouette with pulmonary vascular congestion. No overt pulmonary edema on the current study.  Patient had similar chest x-ray reading during admission.   Cardiac Monitoring: EKG was reviewed by attending.  ED Course / Critical Interventions: Pt sitting in a bed in upright position in no acute distress and nontoxic-appearing.  Patient is able to talk in full sentences.  Patient is complaining of orthopnea and pitting edema in bilateral lower extremities.  Patient has clear lungs to auscultation.  Patient advises swelling in abdomen, no noted fluid wave on abdominal exam.  Patient is also complaining of fluid retention but denies dysuria or foul odors to urine.  No CVA tenderness on exam.  Pitting edema bilateral lower extremities to mid tibia on exam.  Fluid retention is most likely due to recent decrease of Lasix  from 40 mg to 20 mg by PCP due to concern for renal dysfunction.  Patient was started on IV Lasix  40 mg for fluid retention.  Pending bladder scan for bladder retention.  If patient has bladder retention will place a urinary catheter and send home. Plan discussed with attending and patient advised to alternate 40 and 20 mg dosage of Lasix  at home and follow-up with  PCP for management.  Patient care transferred to oncoming ER Attending.  This chart was dictated using voice recognition software.  Despite best efforts to proofread, errors can occur which can change the documentation meaning.   Amount and/or Complexity of Data Reviewed Labs: ordered. Radiology: ordered.  Risk Prescription drug  management.     Final diagnoses:  None    ED Discharge Orders     None          Myriam Fonda GORMAN DEVONNA 05/02/24 9354    Jerral Meth, MD 05/02/24 [phone]

## 2024-05-02 NOTE — ED Notes (Signed)
 Pt Dc'd from ED with NAD, VSS. DC teaching enforced and patient verbalized understanding.

## 2024-05-02 NOTE — Discharge Instructions (Addendum)
 You were seen for your fluid on your legs and cough in the emergency department.   At home, please take the Lasix  40 mg for the next 5 days.  Continue the antibiotics that were prescribed to you by your outpatient doctor.  You may take the codeine  at night for your cough.  Check your MyChart online for the results of any tests that had not resulted by the time you left the emergency department.   Follow-up with your primary doctor in 2-3 days regarding your visit.    Return immediately to the emergency department if you experience any of the following: difficulty breathing, flank pain, fever, or any other concerning symptoms.    Thank you for visiting our Emergency Department. It was a pleasure taking care of you today.

## 2024-05-02 NOTE — ED Triage Notes (Signed)
 Patient presents to ed c/o sob onset  states he was recently hospitalized for UTI c/o increased sob with laying down and states he is constantly coughing. States he is taking his lasix  as ordered.

## 2024-05-03 ENCOUNTER — Other Ambulatory Visit: Payer: Self-pay

## 2024-05-03 ENCOUNTER — Encounter (HOSPITAL_COMMUNITY): Payer: Self-pay | Admitting: *Deleted

## 2024-05-03 ENCOUNTER — Emergency Department (HOSPITAL_COMMUNITY)

## 2024-05-03 ENCOUNTER — Inpatient Hospital Stay (HOSPITAL_COMMUNITY)
Admission: EM | Admit: 2024-05-03 | Discharge: 2024-05-10 | DRG: 286 | Disposition: A | Discharge status: Home Health | Attending: Internal Medicine | Admitting: Internal Medicine

## 2024-05-03 DIAGNOSIS — N390 Urinary tract infection, site not specified: Secondary | ICD-10-CM

## 2024-05-03 DIAGNOSIS — N170 Acute kidney failure with tubular necrosis: Secondary | ICD-10-CM | POA: Diagnosis present

## 2024-05-03 DIAGNOSIS — R0902 Hypoxemia: Secondary | ICD-10-CM | POA: Diagnosis present

## 2024-05-03 DIAGNOSIS — K8020 Calculus of gallbladder without cholecystitis without obstruction: Secondary | ICD-10-CM | POA: Diagnosis present

## 2024-05-03 DIAGNOSIS — I5043 Acute on chronic combined systolic (congestive) and diastolic (congestive) heart failure: Secondary | ICD-10-CM | POA: Diagnosis present

## 2024-05-03 DIAGNOSIS — D1803 Hemangioma of intra-abdominal structures: Secondary | ICD-10-CM | POA: Diagnosis present

## 2024-05-03 DIAGNOSIS — E785 Hyperlipidemia, unspecified: Secondary | ICD-10-CM | POA: Diagnosis present

## 2024-05-03 DIAGNOSIS — Z91041 Radiographic dye allergy status: Secondary | ICD-10-CM

## 2024-05-03 DIAGNOSIS — Z8674 Personal history of sudden cardiac arrest: Secondary | ICD-10-CM

## 2024-05-03 DIAGNOSIS — I083 Combined rheumatic disorders of mitral, aortic and tricuspid valves: Secondary | ICD-10-CM | POA: Diagnosis present

## 2024-05-03 DIAGNOSIS — I10 Essential (primary) hypertension: Secondary | ICD-10-CM | POA: Insufficient documentation

## 2024-05-03 DIAGNOSIS — N3000 Acute cystitis without hematuria: Secondary | ICD-10-CM | POA: Diagnosis present

## 2024-05-03 DIAGNOSIS — N1832 Chronic kidney disease, stage 3b: Secondary | ICD-10-CM | POA: Diagnosis present

## 2024-05-03 DIAGNOSIS — N400 Enlarged prostate without lower urinary tract symptoms: Secondary | ICD-10-CM | POA: Diagnosis present

## 2024-05-03 DIAGNOSIS — I447 Left bundle-branch block, unspecified: Secondary | ICD-10-CM | POA: Diagnosis present

## 2024-05-03 DIAGNOSIS — J069 Acute upper respiratory infection, unspecified: Secondary | ICD-10-CM | POA: Diagnosis present

## 2024-05-03 DIAGNOSIS — I420 Dilated cardiomyopathy: Secondary | ICD-10-CM | POA: Diagnosis present

## 2024-05-03 DIAGNOSIS — E119 Type 2 diabetes mellitus without complications: Secondary | ICD-10-CM

## 2024-05-03 DIAGNOSIS — Z95810 Presence of automatic (implantable) cardiac defibrillator: Secondary | ICD-10-CM

## 2024-05-03 DIAGNOSIS — I130 Hypertensive heart and chronic kidney disease with heart failure and stage 1 through stage 4 chronic kidney disease, or unspecified chronic kidney disease: Principal | ICD-10-CM | POA: Diagnosis present

## 2024-05-03 DIAGNOSIS — N179 Acute kidney failure, unspecified: Principal | ICD-10-CM

## 2024-05-03 DIAGNOSIS — E1169 Type 2 diabetes mellitus with other specified complication: Secondary | ICD-10-CM | POA: Diagnosis present

## 2024-05-03 DIAGNOSIS — I509 Heart failure, unspecified: Secondary | ICD-10-CM

## 2024-05-03 DIAGNOSIS — K7200 Acute and subacute hepatic failure without coma: Secondary | ICD-10-CM | POA: Diagnosis present

## 2024-05-03 DIAGNOSIS — Z888 Allergy status to other drugs, medicaments and biological substances status: Secondary | ICD-10-CM

## 2024-05-03 DIAGNOSIS — I2510 Atherosclerotic heart disease of native coronary artery without angina pectoris: Secondary | ICD-10-CM | POA: Diagnosis present

## 2024-05-03 DIAGNOSIS — K761 Chronic passive congestion of liver: Secondary | ICD-10-CM | POA: Diagnosis present

## 2024-05-03 DIAGNOSIS — Z91199 Patient's noncompliance with other medical treatment and regimen due to unspecified reason: Secondary | ICD-10-CM

## 2024-05-03 DIAGNOSIS — I5082 Biventricular heart failure: Secondary | ICD-10-CM | POA: Diagnosis present

## 2024-05-03 DIAGNOSIS — I255 Ischemic cardiomyopathy: Secondary | ICD-10-CM | POA: Diagnosis present

## 2024-05-03 DIAGNOSIS — A419 Sepsis, unspecified organism: Secondary | ICD-10-CM | POA: Diagnosis present

## 2024-05-03 DIAGNOSIS — E872 Acidosis, unspecified: Secondary | ICD-10-CM | POA: Diagnosis present

## 2024-05-03 DIAGNOSIS — Z79899 Other long term (current) drug therapy: Secondary | ICD-10-CM

## 2024-05-03 DIAGNOSIS — I440 Atrioventricular block, first degree: Secondary | ICD-10-CM | POA: Diagnosis present

## 2024-05-03 DIAGNOSIS — G4733 Obstructive sleep apnea (adult) (pediatric): Secondary | ICD-10-CM | POA: Diagnosis present

## 2024-05-03 DIAGNOSIS — Z8719 Personal history of other diseases of the digestive system: Secondary | ICD-10-CM

## 2024-05-03 DIAGNOSIS — B9620 Unspecified Escherichia coli [E. coli] as the cause of diseases classified elsewhere: Secondary | ICD-10-CM | POA: Diagnosis present

## 2024-05-03 DIAGNOSIS — B9781 Human metapneumovirus as the cause of diseases classified elsewhere: Secondary | ICD-10-CM | POA: Diagnosis present

## 2024-05-03 DIAGNOSIS — R6520 Severe sepsis without septic shock: Secondary | ICD-10-CM | POA: Diagnosis present

## 2024-05-03 DIAGNOSIS — E871 Hypo-osmolality and hyponatremia: Secondary | ICD-10-CM | POA: Diagnosis not present

## 2024-05-03 DIAGNOSIS — Z6831 Body mass index (BMI) 31.0-31.9, adult: Secondary | ICD-10-CM

## 2024-05-03 DIAGNOSIS — R570 Cardiogenic shock: Secondary | ICD-10-CM | POA: Diagnosis present

## 2024-05-03 DIAGNOSIS — Z8249 Family history of ischemic heart disease and other diseases of the circulatory system: Secondary | ICD-10-CM

## 2024-05-03 DIAGNOSIS — D649 Anemia, unspecified: Secondary | ICD-10-CM | POA: Diagnosis present

## 2024-05-03 DIAGNOSIS — I5023 Acute on chronic systolic (congestive) heart failure: Secondary | ICD-10-CM | POA: Diagnosis present

## 2024-05-03 DIAGNOSIS — Z7901 Long term (current) use of anticoagulants: Secondary | ICD-10-CM

## 2024-05-03 DIAGNOSIS — E66811 Obesity, class 1: Secondary | ICD-10-CM | POA: Diagnosis present

## 2024-05-03 DIAGNOSIS — I480 Paroxysmal atrial fibrillation: Secondary | ICD-10-CM | POA: Diagnosis present

## 2024-05-03 DIAGNOSIS — I272 Pulmonary hypertension, unspecified: Secondary | ICD-10-CM | POA: Diagnosis present

## 2024-05-03 DIAGNOSIS — E1122 Type 2 diabetes mellitus with diabetic chronic kidney disease: Secondary | ICD-10-CM | POA: Diagnosis present

## 2024-05-03 DIAGNOSIS — E875 Hyperkalemia: Secondary | ICD-10-CM | POA: Diagnosis present

## 2024-05-03 LAB — BASIC METABOLIC PANEL WITH GFR
Anion gap: 18 — ABNORMAL HIGH (ref 5–15)
BUN: 41 mg/dL — ABNORMAL HIGH (ref 8–23)
CO2: 19 mmol/L — ABNORMAL LOW (ref 22–32)
Calcium: 10.1 mg/dL (ref 8.9–10.3)
Chloride: 100 mmol/L (ref 98–111)
Creatinine, Ser: 2.72 mg/dL — ABNORMAL HIGH (ref 0.61–1.24)
GFR, Estimated: 24 mL/min — ABNORMAL LOW (ref >60)
Glucose, Bld: 76 mg/dL (ref 70–99)
Potassium: 5.4 mmol/L — ABNORMAL HIGH (ref 3.5–5.1)
Sodium: 137 mmol/L (ref 135–145)

## 2024-05-03 LAB — CBC
HCT: 39.7 % (ref 39.0–52.0)
Hemoglobin: 12 g/dL — ABNORMAL LOW (ref 13.0–17.0)
MCH: 28.5 pg (ref 26.0–34.0)
MCHC: 30.2 g/dL (ref 30.0–36.0)
MCV: 94.3 fL (ref 80.0–100.0)
Platelets: 121 K/uL — ABNORMAL LOW (ref 150–400)
RBC: 4.21 MIL/uL — ABNORMAL LOW (ref 4.22–5.81)
RDW: 15.9 % — ABNORMAL HIGH (ref 11.5–15.5)
WBC: 12.4 K/uL — ABNORMAL HIGH (ref 4.0–10.5)
nRBC: 0 % (ref 0.0–0.2)

## 2024-05-03 LAB — URINALYSIS, MICROSCOPIC (REFLEX): WBC, UA: >50 WBC/hpf (ref 0–5)

## 2024-05-03 LAB — URINALYSIS, ROUTINE W REFLEX MICROSCOPIC
Bilirubin Urine: NEGATIVE
Glucose, UA: NEGATIVE mg/dL
Ketones, ur: NEGATIVE mg/dL
Nitrite: NEGATIVE
Protein, ur: >300 mg/dL — AB
Specific Gravity, Urine: >1.03 — ABNORMAL HIGH (ref 1.005–1.030)
pH: 5.5 (ref 5.0–8.0)

## 2024-05-03 LAB — URINE CULTURE

## 2024-05-03 LAB — BRAIN NATRIURETIC PEPTIDE: B Natriuretic Peptide: 2633.5 pg/mL — ABNORMAL HIGH (ref 0.0–100.0)

## 2024-05-03 LAB — TROPONIN I (HIGH SENSITIVITY): Troponin I (High Sensitivity): 61 ng/L — ABNORMAL HIGH (ref <18)

## 2024-05-03 MED ORDER — SODIUM CHLORIDE 0.9 % IV SOLN
1.0000 g | Freq: Once | INTRAVENOUS | Status: AC
  Administered 2024-05-04: 1 g via INTRAVENOUS
  Filled 2024-05-03: qty 10

## 2024-05-03 MED ORDER — SODIUM ZIRCONIUM CYCLOSILICATE 10 G PO PACK
10.0000 g | PACK | Freq: Once | ORAL | Status: AC
  Administered 2024-05-04: 10 g via ORAL
  Filled 2024-05-03: qty 1

## 2024-05-03 NOTE — ED Triage Notes (Signed)
 The pt reports that he was here Tuesday for a uti  he has been taking mweds and he reports that he has not been sleeping or eating for 4 days

## 2024-05-03 NOTE — ED Provider Notes (Signed)
 [facility] DEPT [facility] Emergency Department Provider Note MRN:  [id number]  Arrival date & time: 05/03/24     Chief Complaint   not eating or sleeping   History of Present Illness   Dean Jesus Sr. is a 73 y.o. year-old male presents to the ED with chief complaint of shortness of breath, generalized fatigue, and UTI.  States that he has been fighting a UTI for over a month.  He is still having some mild symptoms.  States that he has become so weak that he cannot walk.  He reports new shortness of breath.  States that he has increased fluid on his lower extremities.  In triage, patient had O2 saturation dropped into the 70s.  He was put on oxygen.  He does not wear oxygen at baseline.  He denies recent fever.  Denies any other new symptoms.  Hx of VT s/p AICD, PAF on Eliquis , HFrEF (EF 30-35% in 12/2023), ICM, HTN, HLD, and DM2 p/w elevated BNP, and pulmonary vascular congestion on CXR c/w HFrEF exacerbation c/b AKI.   History provided by patient. {RB interpreter (Optional):27221}  Review of Systems  Pertinent positive and negative review of systems noted in HPI.    Physical Exam   Vitals:   05/03/24 2120 05/03/24 2247  BP:  (!) 143/85  Pulse: 80 83  Resp:  (!) 28  Temp:    SpO2: 100% 100%    CONSTITUTIONAL:  chronically ill-appearing, NAD NEURO:  Alert and oriented x 3, CN 3-12 grossly intact EYES:  eyes equal and reactive ENT/NECK:  Supple, no stridor  CARDIO:  normal rate, regular rhythm, appears well-perfused  PULM:  Mild respiratory distress, increased work of breathing, crackles present, coughing during exam GI/GU:  non-distended,  MSK/SPINE:  No gross deformities, 2+ pitting edema, moves all extremities  SKIN:  no rash, atraumatic   *Additional and/or pertinent findings included in MDM below  Diagnostic and Interventional Summary    EKG Interpretation Date/Time:    Ventricular Rate:    PR Interval:    QRS Duration:    QT Interval:     QTC Calculation:   R Axis:      Text Interpretation:         Labs Reviewed  BASIC METABOLIC PANEL WITH GFR - Abnormal; Notable for the following components:      Result Value   Potassium 5.4 (*)    CO2 19 (*)    BUN 41 (*)    Creatinine, Ser 2.72 (*)    GFR, Estimated 24 (*)    Anion gap 18 (*)    All other components within normal limits  CBC - Abnormal; Notable for the following components:   WBC 12.4 (*)    RBC 4.21 (*)    Hemoglobin 12.0 (*)    RDW 15.9 (*)    Platelets 121 (*)    All other components within normal limits  BRAIN NATRIURETIC PEPTIDE - Abnormal; Notable for the following components:   B Natriuretic Peptide 2,633.5 (*)    All other components within normal limits  URINALYSIS, ROUTINE W REFLEX MICROSCOPIC - Abnormal; Notable for the following components:   APPearance HAZY (*)    Specific Gravity, Urine >1.030 (*)    Hgb urine dipstick LARGE (*)    Protein, ur >300 (*)    Leukocytes,Ua SMALL (*)    All other components within normal limits  URINALYSIS, MICROSCOPIC (REFLEX) - Abnormal; Notable for the following components:   Bacteria, UA RARE (*)  All other components within normal limits  TROPONIN I (HIGH SENSITIVITY) - Abnormal; Notable for the following components:   Troponin I (High Sensitivity) 61 (*)    All other components within normal limits  TROPONIN I (HIGH SENSITIVITY)    DG Chest Port 1 View  Final Result      Medications  sodium zirconium cyclosilicate  (LOKELMA ) packet 10 g (has no administration in time range)  cefTRIAXone  (ROCEPHIN ) 1 g in sodium chloride  0.9 % 100 mL IVPB (has no administration in time range)     Procedures  /  Critical Care .Critical Care  Performed by: Vicky Charleston, PA-C Authorized by: Vicky Charleston, PA-C   Critical care provider statement:    Critical care time (minutes):  46   Critical care was necessary to treat or prevent imminent or life-threatening deterioration of the following conditions:   Respiratory failure and renal failure   Critical care was time spent personally by me on the following activities:  Development of treatment plan with patient or surrogate, discussions with consultants, evaluation of patient's response to treatment, examination of patient, ordering and review of laboratory studies, ordering and review of radiographic studies, ordering and performing treatments and interventions, pulse oximetry, re-evaluation of patient's condition and review of old charts   ED Course and Medical Decision Making  I have reviewed the triage vital signs, the nursing notes, and pertinent available records from the EMR.  Social Determinants Affecting Complexity of Care: Patient has no clinically significant social determinants affecting this chief complaint.. {rbsocialsolutions:27068}  ED Course:    Medical Decision Making Amount and/or Complexity of Data Reviewed Labs: ordered. Radiology: ordered. ECG/medicine tests: ordered.  Risk Prescription drug management. Decision regarding hospitalization.      {rbcpddx (Optional):29772:::1} {rbabdddx (Optional):29773:s::1}  Consultants: I consulted with Hospitalist, Dr. ***, who is appreciated for admitting.   Treatment and Plan: Patient's exam and diagnostic results are concerning for AKI, CHF exacerbation, and probable persistent UTI.  Feel that patient will need admission to the hospital for further treatment and evaluation.  {rbattending:27073}  Final Clinical Impressions(s) / ED Diagnoses     ICD-10-CM   1. AKI (acute kidney injury) (HCC)  N17.9     2. Acute on chronic congestive heart failure, unspecified heart failure type (HCC)  I50.9     3. Urinary tract infection without hematuria, site unspecified  N39.0       ED Discharge Orders     None         Discharge Instructions Discussed with and Provided to Patient:   Discharge Instructions   None

## 2024-05-03 NOTE — ED Notes (Signed)
 Pt brought back to triage 3 and placed on 2L [place], now satting at 100%. Pulse rechecked and now at 80bpm. Triage RN made aware.

## 2024-05-03 NOTE — ED Notes (Signed)
 The pt  thinks that the 2 meds he was given here is the cause of his inability to sleep or eat  and he thinks he may be allergic to it  pulse in triage read 30 however his pulse back here is 70-80

## 2024-05-03 NOTE — ED Notes (Signed)
 The pt is falling asleep and his sats drop and heart rate drops

## 2024-05-04 ENCOUNTER — Other Ambulatory Visit: Payer: Self-pay

## 2024-05-04 ENCOUNTER — Encounter (HOSPITAL_COMMUNITY): Payer: Self-pay | Admitting: Internal Medicine

## 2024-05-04 ENCOUNTER — Inpatient Hospital Stay (HOSPITAL_COMMUNITY)

## 2024-05-04 DIAGNOSIS — A419 Sepsis, unspecified organism: Secondary | ICD-10-CM | POA: Diagnosis present

## 2024-05-04 DIAGNOSIS — N1832 Chronic kidney disease, stage 3b: Secondary | ICD-10-CM | POA: Diagnosis present

## 2024-05-04 DIAGNOSIS — Z8679 Personal history of other diseases of the circulatory system: Secondary | ICD-10-CM

## 2024-05-04 DIAGNOSIS — R6520 Severe sepsis without septic shock: Secondary | ICD-10-CM

## 2024-05-04 DIAGNOSIS — I272 Pulmonary hypertension, unspecified: Secondary | ICD-10-CM | POA: Diagnosis present

## 2024-05-04 DIAGNOSIS — E1122 Type 2 diabetes mellitus with diabetic chronic kidney disease: Secondary | ICD-10-CM | POA: Diagnosis present

## 2024-05-04 DIAGNOSIS — E785 Hyperlipidemia, unspecified: Secondary | ICD-10-CM | POA: Diagnosis present

## 2024-05-04 DIAGNOSIS — E119 Type 2 diabetes mellitus without complications: Secondary | ICD-10-CM

## 2024-05-04 DIAGNOSIS — N179 Acute kidney failure, unspecified: Secondary | ICD-10-CM

## 2024-05-04 DIAGNOSIS — N170 Acute kidney failure with tubular necrosis: Secondary | ICD-10-CM | POA: Diagnosis present

## 2024-05-04 DIAGNOSIS — I480 Paroxysmal atrial fibrillation: Secondary | ICD-10-CM | POA: Diagnosis present

## 2024-05-04 DIAGNOSIS — I083 Combined rheumatic disorders of mitral, aortic and tricuspid valves: Secondary | ICD-10-CM | POA: Diagnosis present

## 2024-05-04 DIAGNOSIS — I5043 Acute on chronic combined systolic (congestive) and diastolic (congestive) heart failure: Secondary | ICD-10-CM | POA: Diagnosis present

## 2024-05-04 DIAGNOSIS — D649 Anemia, unspecified: Secondary | ICD-10-CM | POA: Diagnosis present

## 2024-05-04 DIAGNOSIS — I2510 Atherosclerotic heart disease of native coronary artery without angina pectoris: Secondary | ICD-10-CM | POA: Diagnosis present

## 2024-05-04 DIAGNOSIS — E8729 Other acidosis: Secondary | ICD-10-CM | POA: Insufficient documentation

## 2024-05-04 DIAGNOSIS — E782 Mixed hyperlipidemia: Secondary | ICD-10-CM

## 2024-05-04 DIAGNOSIS — E875 Hyperkalemia: Secondary | ICD-10-CM

## 2024-05-04 DIAGNOSIS — I5082 Biventricular heart failure: Secondary | ICD-10-CM | POA: Diagnosis present

## 2024-05-04 DIAGNOSIS — K761 Chronic passive congestion of liver: Secondary | ICD-10-CM | POA: Diagnosis present

## 2024-05-04 DIAGNOSIS — I10 Essential (primary) hypertension: Secondary | ICD-10-CM | POA: Diagnosis not present

## 2024-05-04 DIAGNOSIS — I5023 Acute on chronic systolic (congestive) heart failure: Secondary | ICD-10-CM | POA: Diagnosis not present

## 2024-05-04 DIAGNOSIS — E66811 Obesity, class 1: Secondary | ICD-10-CM

## 2024-05-04 DIAGNOSIS — I420 Dilated cardiomyopathy: Secondary | ICD-10-CM | POA: Diagnosis present

## 2024-05-04 DIAGNOSIS — I130 Hypertensive heart and chronic kidney disease with heart failure and stage 1 through stage 4 chronic kidney disease, or unspecified chronic kidney disease: Secondary | ICD-10-CM | POA: Diagnosis present

## 2024-05-04 DIAGNOSIS — R0602 Shortness of breath: Secondary | ICD-10-CM | POA: Insufficient documentation

## 2024-05-04 DIAGNOSIS — N3000 Acute cystitis without hematuria: Secondary | ICD-10-CM | POA: Diagnosis present

## 2024-05-04 DIAGNOSIS — K7200 Acute and subacute hepatic failure without coma: Secondary | ICD-10-CM | POA: Diagnosis present

## 2024-05-04 DIAGNOSIS — E872 Acidosis, unspecified: Secondary | ICD-10-CM | POA: Diagnosis present

## 2024-05-04 DIAGNOSIS — Z6831 Body mass index (BMI) 31.0-31.9, adult: Secondary | ICD-10-CM | POA: Diagnosis not present

## 2024-05-04 DIAGNOSIS — N3001 Acute cystitis with hematuria: Secondary | ICD-10-CM

## 2024-05-04 DIAGNOSIS — R570 Cardiogenic shock: Secondary | ICD-10-CM | POA: Diagnosis present

## 2024-05-04 DIAGNOSIS — E1169 Type 2 diabetes mellitus with other specified complication: Secondary | ICD-10-CM | POA: Diagnosis present

## 2024-05-04 DIAGNOSIS — J9601 Acute respiratory failure with hypoxia: Secondary | ICD-10-CM

## 2024-05-04 DIAGNOSIS — R531 Weakness: Secondary | ICD-10-CM

## 2024-05-04 DIAGNOSIS — R7989 Other specified abnormal findings of blood chemistry: Secondary | ICD-10-CM | POA: Insufficient documentation

## 2024-05-04 DIAGNOSIS — E871 Hypo-osmolality and hyponatremia: Secondary | ICD-10-CM | POA: Diagnosis not present

## 2024-05-04 LAB — COMPREHENSIVE METABOLIC PANEL WITH GFR
ALT: 2269 U/L — ABNORMAL HIGH (ref 0–44)
AST: 3966 U/L — ABNORMAL HIGH (ref 15–41)
Albumin: 3.6 g/dL (ref 3.5–5.0)
Alkaline Phosphatase: 89 U/L (ref 38–126)
Anion gap: 16 — ABNORMAL HIGH (ref 5–15)
BUN: 45 mg/dL — ABNORMAL HIGH (ref 8–23)
CO2: 21 mmol/L — ABNORMAL LOW (ref 22–32)
Calcium: 9.8 mg/dL (ref 8.9–10.3)
Chloride: 102 mmol/L (ref 98–111)
Creatinine, Ser: 2.87 mg/dL — ABNORMAL HIGH (ref 0.61–1.24)
GFR, Estimated: 22 mL/min — ABNORMAL LOW (ref >60)
Glucose, Bld: 65 mg/dL — ABNORMAL LOW (ref 70–99)
Potassium: 6.6 mmol/L (ref 3.5–5.1)
Sodium: 139 mmol/L (ref 135–145)
Total Bilirubin: 3.8 mg/dL — ABNORMAL HIGH (ref 0.0–1.2)
Total Protein: 7.1 g/dL (ref 6.5–8.1)

## 2024-05-04 LAB — CBC WITH DIFFERENTIAL/PLATELET
Abs Granulocyte: 10.2 K/uL — ABNORMAL HIGH (ref 1.5–6.5)
Abs Immature Granulocytes: 0.17 K/uL — ABNORMAL HIGH (ref 0.00–0.07)
Basophils Absolute: 0 K/uL (ref 0.0–0.1)
Basophils Relative: 0 %
Eosinophils Absolute: 0 K/uL (ref 0.0–0.5)
Eosinophils Relative: 0 %
HCT: 36.3 % — ABNORMAL LOW (ref 39.0–52.0)
Hemoglobin: 10.9 g/dL — ABNORMAL LOW (ref 13.0–17.0)
Immature Granulocytes: 1 %
Lymphocytes Relative: 6 %
Lymphs Abs: 0.7 K/uL (ref 0.7–4.0)
MCH: 28.7 pg (ref 26.0–34.0)
MCHC: 30 g/dL (ref 30.0–36.0)
MCV: 95.5 fL (ref 80.0–100.0)
Monocytes Absolute: 1.2 K/uL — ABNORMAL HIGH (ref 0.1–1.0)
Monocytes Relative: 9 %
Neutro Abs: 10.2 K/uL — ABNORMAL HIGH (ref 1.7–7.7)
Neutrophils Relative %: 84 %
Platelets: 60 K/uL — ABNORMAL LOW (ref 150–400)
RBC: 3.8 MIL/uL — ABNORMAL LOW (ref 4.22–5.81)
RDW: 15.9 % — ABNORMAL HIGH (ref 11.5–15.5)
WBC: 12.3 K/uL — ABNORMAL HIGH (ref 4.0–10.5)
nRBC: 0 % (ref 0.0–0.2)

## 2024-05-04 LAB — I-STAT CHEM 8, ED
BUN: 45 mg/dL — ABNORMAL HIGH (ref 8–23)
Calcium, Ion: 1.11 mmol/L — ABNORMAL LOW (ref 1.15–1.40)
Chloride: 103 mmol/L (ref 98–111)
Creatinine, Ser: 2.8 mg/dL — ABNORMAL HIGH (ref 0.61–1.24)
Glucose, Bld: 77 mg/dL (ref 70–99)
HCT: 36 % — ABNORMAL LOW (ref 39.0–52.0)
Hemoglobin: 12.2 g/dL — ABNORMAL LOW (ref 13.0–17.0)
Potassium: 5.1 mmol/L (ref 3.5–5.1)
Sodium: 137 mmol/L (ref 135–145)
TCO2: 21 mmol/L — ABNORMAL LOW (ref 22–32)

## 2024-05-04 LAB — LACTIC ACID, PLASMA: Lactic Acid, Venous: 3.4 mmol/L (ref 0.5–1.9)

## 2024-05-04 LAB — CBG MONITORING, ED
Glucose-Capillary: 120 mg/dL — ABNORMAL HIGH (ref 70–99)
Glucose-Capillary: 126 mg/dL — ABNORMAL HIGH (ref 70–99)

## 2024-05-04 LAB — GLUCOSE, CAPILLARY
Glucose-Capillary: 104 mg/dL — ABNORMAL HIGH (ref 70–99)
Glucose-Capillary: 144 mg/dL — ABNORMAL HIGH (ref 70–99)

## 2024-05-04 LAB — I-STAT CG4 LACTIC ACID, ED
Lactic Acid, Venous: 6.6 mmol/L (ref 0.5–1.9)
Lactic Acid, Venous: 5.7 mmol/L (ref 0.5–1.9)

## 2024-05-04 LAB — PROCALCITONIN: Procalcitonin: 6.68 ng/mL

## 2024-05-04 LAB — MAGNESIUM
Magnesium: 2.1 mg/dL (ref 1.7–2.4)
Magnesium: 2.2 mg/dL (ref 1.7–2.4)

## 2024-05-04 LAB — TROPONIN I (HIGH SENSITIVITY): Troponin I (High Sensitivity): 53 ng/L — ABNORMAL HIGH (ref <18)

## 2024-05-04 LAB — BRAIN NATRIURETIC PEPTIDE: B Natriuretic Peptide: 2770.3 pg/mL — ABNORMAL HIGH (ref 0.0–100.0)

## 2024-05-04 LAB — PHOSPHORUS: Phosphorus: 5.1 mg/dL — ABNORMAL HIGH (ref 2.5–4.6)

## 2024-05-04 MED ORDER — ONDANSETRON HCL 4 MG/2ML IJ SOLN: 4.0000 mg | Freq: Four times a day (QID) | INTRAMUSCULAR | Status: DC | PRN

## 2024-05-04 MED ORDER — FUROSEMIDE 10 MG/ML IJ SOLN
40.0000 mg | Freq: Once | INTRAMUSCULAR | Status: AC
  Administered 2024-05-04: 40 mg via INTRAVENOUS
  Filled 2024-05-04: qty 4

## 2024-05-04 MED ORDER — FERROUS SULFATE 325 (65 FE) MG PO TABS
325.0000 mg | ORAL_TABLET | Freq: Every day | ORAL | Status: DC
  Administered 2024-05-04 – 2024-05-10 (×10): 325 mg via ORAL
  Filled 2024-05-04 (×7): qty 1

## 2024-05-04 MED ORDER — ACETAMINOPHEN 650 MG RE SUPP
650.0000 mg | Freq: Four times a day (QID) | RECTAL | Status: DC | PRN
Start: 2024-05-04 — End: 2024-05-11

## 2024-05-04 MED ORDER — SODIUM CHLORIDE 0.9 % IV SOLN: 1.0000 g | INTRAVENOUS | Status: DC

## 2024-05-04 MED ORDER — FUROSEMIDE 10 MG/ML IJ SOLN: 20.0000 mg | Freq: Once | INTRAMUSCULAR | Status: DC

## 2024-05-04 MED ORDER — ACETAMINOPHEN 325 MG PO TABS: 650.0000 mg | ORAL_TABLET | Freq: Four times a day (QID) | ORAL | Status: DC | PRN

## 2024-05-04 MED ORDER — CARVEDILOL 12.5 MG PO TABS
12.5000 mg | ORAL_TABLET | Freq: Two times a day (BID) | ORAL | Status: DC
  Administered 2024-05-04 – 2024-05-05 (×3): 12.5 mg via ORAL
  Filled 2024-05-04 (×2): qty 1

## 2024-05-04 MED ORDER — CEPHALEXIN 500 MG PO CAPS
500.0000 mg | ORAL_CAPSULE | Freq: Two times a day (BID) | ORAL | Status: AC
  Administered 2024-05-04 – 2024-05-06 (×10): 500 mg via ORAL
  Filled 2024-05-04: qty 1
  Filled 2024-05-04: qty 2
  Filled 2024-05-04 (×4): qty 1

## 2024-05-04 MED ORDER — BENZONATATE 100 MG PO CAPS
200.0000 mg | ORAL_CAPSULE | Freq: Three times a day (TID) | ORAL | Status: DC | PRN
  Administered 2024-05-06 (×4): 200 mg via ORAL
  Filled 2024-05-04 (×2): qty 2

## 2024-05-04 MED ORDER — IPRATROPIUM-ALBUTEROL 0.5-2.5 (3) MG/3ML IN SOLN
3.0000 mL | Freq: Four times a day (QID) | RESPIRATORY_TRACT | Status: DC | PRN
  Administered 2024-05-04 – 2024-05-09 (×8): 3 mL via RESPIRATORY_TRACT
  Filled 2024-05-04 (×6): qty 3

## 2024-05-04 MED ORDER — SODIUM BICARBONATE 8.4 % IV SOLN
50.0000 meq | Freq: Once | INTRAVENOUS | Status: AC
  Administered 2024-05-04: 50 meq via INTRAVENOUS
  Filled 2024-05-04: qty 50

## 2024-05-04 MED ORDER — AMIODARONE HCL 200 MG PO TABS
200.0000 mg | ORAL_TABLET | Freq: Every day | ORAL | Status: DC
  Administered 2024-05-04 – 2024-05-10 (×10): 200 mg via ORAL
  Filled 2024-05-04 (×7): qty 1

## 2024-05-04 MED ORDER — ATORVASTATIN CALCIUM 40 MG PO TABS
40.0000 mg | ORAL_TABLET | Freq: Every day | ORAL | Status: DC
  Administered 2024-05-04 – 2024-05-06 (×5): 40 mg via ORAL
  Filled 2024-05-04 (×3): qty 1

## 2024-05-04 MED ORDER — FUROSEMIDE 10 MG/ML IJ SOLN
60.0000 mg | Freq: Two times a day (BID) | INTRAMUSCULAR | Status: DC
  Administered 2024-05-04: 60 mg via INTRAVENOUS
  Filled 2024-05-04 (×2): qty 6

## 2024-05-04 MED ORDER — FUROSEMIDE 10 MG/ML IJ SOLN
20.0000 mg | Freq: Two times a day (BID) | INTRAMUSCULAR | Status: DC
  Administered 2024-05-04 (×2): 20 mg via INTRAVENOUS
  Filled 2024-05-04 (×2): qty 2

## 2024-05-04 MED ORDER — MELATONIN 3 MG PO TABS
3.0000 mg | ORAL_TABLET | Freq: Every evening | ORAL | Status: DC | PRN
  Administered 2024-05-09: 3 mg via ORAL
  Filled 2024-05-04: qty 1

## 2024-05-04 MED ORDER — INSULIN ASPART 100 UNIT/ML IJ SOLN
0.0000 [IU] | Freq: Three times a day (TID) | INTRAMUSCULAR | Status: DC
  Administered 2024-05-05 (×2): 1 [IU] via SUBCUTANEOUS

## 2024-05-04 MED ORDER — APIXABAN 5 MG PO TABS
5.0000 mg | ORAL_TABLET | Freq: Two times a day (BID) | ORAL | Status: DC
  Administered 2024-05-04 – 2024-05-06 (×9): 5 mg via ORAL
  Filled 2024-05-04 (×6): qty 1

## 2024-05-04 NOTE — Evaluation (Signed)
 Physical Therapy Evaluation Patient Details Name: Dean Shillingburg Sr. 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  Clinical Impression   Pt admitted with above diagnosis. 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 Pt will benefit from skilled PT to increase their independence and safety with mobility to allow discharge to the venue listed below.           If plan is discharge home, recommend the following: A little help with walking and/or transfers;A little help with bathing/dressing/bathroom;Assistance with cooking/housework;Help with stairs or ramp for entrance   Can travel by private  vehicle        Equipment Recommendations None recommended by PT  Recommendations for Other Services       Functional Status Assessment Patient has had a recent decline in their functional status and demonstrates the ability to make significant improvements in function in a reasonable and predictable amount of time.     Precautions / Restrictions Precautions Precautions: Fall Recall of Precautions/Restrictions: Intact Restrictions Weight Bearing Restrictions Per Provider Order: No      Mobility  Bed Mobility               General bed mobility comments: Pt sitting in chair upon arrival    Transfers Overall transfer level: Needs assistance Equipment used: Rolling walker (2 wheels) Transfers: Sit to/from Stand Sit to Stand: Supervision           General transfer comment: Supervision for safety    Ambulation/Gait Ambulation/Gait assistance: Contact guard assist Gait Distance (Feet): 65 Feet Assistive device: Rolling walker (2 wheels) Gait Pattern/deviations: Step-through pattern, Decreased stride length, Staggering left, Drifts right/left Gait velocity: dec     General Gait Details: Walked into hallway with RW; Occasionally letting RW too far in front of body;  cues for RW proximity and to self-monitor for activity tolerance  Stairs            Wheelchair Mobility     Tilt Bed    Modified Rankin (Stroke Patients Only)       Balance     Sitting balance-Leahy Scale: Good       Standing balance-Leahy Scale: Fair                               Pertinent Vitals/Pain Pain Assessment Pain  Assessment: No/denies pain    Home Living Family/patient expects to be discharged to:: Private residence Living Arrangements: Alone Available Help at Discharge: Family;Available 24 hours/day Type of Home: House Home Access: Level entry     Alternate Level Stairs-Number of Steps: 13 Home Layout: Two level;Bed/bath upstairs Home Equipment:  Grab bars - tub/shower;Grab bars - toilet;Rollator (4 wheels);Cane - single point;Shower seat;BSC/3in1      Prior Function Prior Level of Function : Independent/Modified Independent;Driving;Working/employed             Mobility Comments: spc for gait at times 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, framing work     Extremity/Trunk Assessment   Upper Extremity Assessment Upper Extremity Assessment: Defer to OT evaluation    Lower Extremity Assessment Lower Extremity Assessment: Generalized weakness       Communication   Communication Communication: No apparent difficulties    Cognition Arousal: Alert Behavior During Therapy: WFL for tasks assessed/performed   PT - Cognitive impairments: No apparent impairments                         Following commands: Intact       Cueing Cueing Techniques: Verbal cues     General Comments General comments (skin integrity, edema, etc.): NAD on room air with activity    Exercises     Assessment/Plan    PT Assessment Patient needs continued PT services  PT Problem List Decreased strength;Decreased activity tolerance;Decreased balance;Decreased mobility;Decreased coordination;Decreased knowledge of use of DME;Cardiopulmonary status limiting activity       PT Treatment Interventions DME instruction;Gait training;Stair training;Functional mobility training;Therapeutic activities;Therapeutic exercise;Balance training;Neuromuscular re-education;Patient/family education    PT Goals (Current goals can be found in the Care Plan section)  Acute Rehab PT Goals Patient Stated Goal: Get well, go home, work PT Goal Formulation: With patient Time For Goal Achievement: 05/18/24 Potential to Achieve Goals: Good    Frequency Min 2X/week     Co-evaluation               AM-PAC PT 6 Clicks Mobility  Outcome Measure Help needed turning from your back to your side while in a flat bed without using bedrails?:  None Help needed moving from lying on your back to sitting on the side of a flat bed without using bedrails?: A Little Help needed moving to and from a bed to a chair (including a wheelchair)?: A Little Help needed standing up from a chair using your arms (e.g., wheelchair or bedside chair)?: A Little Help needed to walk in hospital room?: A Little Help needed climbing 3-5 steps with a railing? : A Little 6 Click Score: 19    End of Session Equipment Utilized During Treatment: Gait belt Activity Tolerance: Patient tolerated treatment well Patient left: in chair;with call bell/phone within reach (eating breakfast) Nurse Communication: Mobility status PT Visit Diagnosis: Unsteadiness on feet (R26.81);Other abnormalities of gait and mobility (R26.89);Muscle weakness (generalized) (M62.81);Difficulty in walking, not elsewhere classified (R26.2)    Time: 9074-9061 PT Time Calculation (min) (ACUTE ONLY): 13 min   Charges:   PT Evaluation $PT Eval Low Complexity: 1 Low   PT General Charges $$ ACUTE PT VISIT: 1 Visit         Silvano Currier, PT  Acute Rehabilitation Services Office [phone] Secure Chat welcomed   Silvano VEAR Currier 05/04/2024, 11:06 AM

## 2024-05-04 NOTE — Assessment & Plan Note (Addendum)
 AKI, hyperkalemia. Hyponatremia.   Worsening renal function with serum cr at 2,83 with K at 4,6 and serum bicarbonate at 21 Na 133 Mg 2,0   Continue with furosemide  80 mg IV bid,  May need inotropic support.  Follow up renal function and electrolytes in am.

## 2024-05-04 NOTE — Assessment & Plan Note (Addendum)
 Patient may need inotropic support Holding antihypertensive medications

## 2024-05-04 NOTE — Hospital Course (Addendum)
 Dean [name] was admitted to the hospital with the working diagnosis of heart failure decompensation.   73 yo male with the past medical history of heart failure, paroxysmal atrial fibrillation, hyperlipidemia, CKD 3b and T2DM who presented with dyspnea. Reported 2 days of progressive dyspnea along with lower extremity edema.  08/07 diagnosed with urinary tract infection and placed on antibiotic therapy.  On his initial physical examination his blood pressure was 143/85, HR 85, RR 28 and 02 saturation 100% Lungs with no wheezing or rhonchi, heart with S1 and S2 present and regular, abdomen with no distention and positive lower extremity edema.   Na 137 K 5,4 Cl 100 bicarbonate 19, glucose 76 bun 41 cr 2,72  P 2.2  BNP 2,633  High sensitive troponin 61 and 53  Wbc 12,4 hgb 12,0 plt 121   Urine analysis SG >1.030, protein > 300, large hgb and small leukocytes, > 50 wbc and 21-50 rbc   Chest radiograph with hypoinflation, left rotation, bilateral hilar vascular congestion, with fluid in the right fissure, positive pacemaker defibrillator in place with one right ventricle lead.   EKG 80 bpm, left axis deviation, qtc 528, left bundle branch block, sinus rhythm with 1st degree AV block, with no significant ST segment or T wave changes.   08/11 responding slowly to diuresis  08/12 patient not responding to diuresis as expected, low blood pressure and continue to have high lactate.  Possible low output heart failure, will consult heart failure team.

## 2024-05-04 NOTE — Assessment & Plan Note (Addendum)
 Past urine culture positive for E coli sensitive for cephalosporins, continue cephalexin  for 3 days total .  One bottle blood culture positive for staphylococcus epidermidis, suspected contaminant.  Will follow up on repeat blood cultures, hold on IV antibiotic therapy.

## 2024-05-04 NOTE — Assessment & Plan Note (Addendum)
 Continue glucose cover and monitoring with insulin  sliding scale Fasting glucose 114 today  Patient is tolerating po well   Continue statin therapy

## 2024-05-04 NOTE — Progress Notes (Addendum)
 Progress Note   Patient: Dean Puccinelli Sr. FMW:[id number] DOB: Feb 01, 1951 DOA: 05/03/2024     0 DOS: the patient was seen and examined on 05/04/2024   Brief hospital course: [name] was admitted to the hospital with the working diagnosis of heart failure decompensation.   73 yo male with the past medical history of heart failure, paroxysmal atrial fibrillation, hyperlipidemia, CKD 3b and T2DM who presented with dyspnea. Reported 2 days of progressive dyspnea along with lower extremity edema.  08/07 diagnosed with urinary tract infection and placed on antibiotic therapy.  On his initial physical examination his blood pressure was 143/85, HR 85, RR 28 and 02 saturation 100% Lungs with no wheezing or rhonchi, heart with S1 and S2 present and regular, abdomen with no distention and positive lower extremity edema.   Na 137 K 5,4 Cl 100 bicarbonate 19, glucose 76 bun 41 cr 2,72  P 2.2  BNP 2,633  High sensitive troponin 61 and 53  Wbc 12,4 hgb 12,0 plt 121   Urine analysis SG >1.030, protein > 300, large hgb and small leukocytes, > 50 wbc and 21-50 rbc   Chest radiograph with hypoinflation, left rotation, bilateral hilar vascular congestion, with fluid in the right fissure, positive pacemaker defibrillator in place with one right ventricle lead.   EKG 80 bpm, left axis deviation, qtc 528, left bundle branch block, sinus rhythm with 1st degree AV block, with no significant ST segment or T wave changes.   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  LV posterior wall  and basal inferior segment akinetic.   Continue volume overloaded.  Systolic blood pressure 130 mmHg range.  Lactic acid is trending down  Plan to continue diuresis with IV furosemide , increase to 60 mg IV bid Resume carvedilol  Holding ARB until renal function more stable.   Essential hypertension Resume carvedilol  for blood pressure control. May add further afterload reduction as tolerated by renal function   Paroxysmal atrial fibrillation (HCC) Resume carvedilol  for rate control and continue anticoagulation with apixaban  Continue with amiodarone    CAD (coronary artery disease) No active chest pain.  High sensitive troponin elevation due to heart failure, no acute coronary syndrome.   Chronic kidney disease, stage 3b (HCC) AKI, hyperkalemia.   Follow up renal function with serum cr at 2,8 with K at 5,1 and serum bicarbonate at 21  Na 137   Plan to continue diuresis with furosemide  Hold on ARB for now.   Type 2 diabetes mellitus with hyperlipidemia (HCC) Continue glucose cover and monitoring with insulin  sliding scale Fasting glucose 77 today  Patient is tolerating po well   Continue statin therapy  Acute cystitis Past urine culture positive for E coli sensitive for cephalosporins, will change IV ceftriaxone  to oral cephalexin   Obesity, class 1 Calculated BMI is 31,4         Subjective: Patient is feeling better but continue to have dyspnea and edema, not back to his baseline   Physical Exam: Vitals:   05/04/24 0725 05/04/24 0835 05/04/24 0845 05/04/24 0944  BP: (!) 127/45 (!) 124/103 (!) 125/52   Pulse: 67 71 70   Resp: (!) 22 (!) 23 ROLLEN)  32   Temp:    97.6 F (36.4 C)  TempSrc:    Oral  SpO2: 98% 100% 100%   Weight:      Height:       Neurology awake and alert ENT with mild pallor with no icterus Cardiovascular with S1 and S2 present and regular with no gallops or rubs, positive systolic murmur at the apex No JVD Respiratory with mild rales at  bases with no wheezing or rhonchi  Abdomen with no distention, non tender Positive lower extremity edema ++   Data Reviewed:    Family Communication: no family at the bedside   Disposition: Status is: Inpatient Remains inpatient appropriate because: heart failure   Planned Discharge Destination: Home    Author: Elidia Toribio Furnace, MD 05/04/2024 10:25 AM  For on call review [email or web address].

## 2024-05-04 NOTE — Assessment & Plan Note (Signed)
 No active chest pain.  High sensitive troponin elevation due to heart failure, no acute coronary syndrome.

## 2024-05-04 NOTE — H&P (Signed)
 History and Physical      Dean Tortorella Sr. FMW:[id number] DOB: [birth year] DOA: 05/03/2024; DOS: 05/04/2024  PCP: [name], MD  Patient coming from: home   I have personally briefly reviewed patient's old medical records in [facility]  Chief Complaint: sob  HPI: Dean Bhalla Sr. is a 73 y.o. 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   The patient reports 2 days of progressive shortness of breath associate with worsening of edema in the bilateral lower extremities.  He denies any associated chest pain, palpitations, diaphoresis, nausea, vomiting, dizziness, presyncope, or syncope.  No recent cough or wheezing.  Reports good compliance with his home Lasix .   He has a history of paroxysmal atrial fibrillation chronically anticoagulated on Eliquis , with good associated compliance, as well as a history of chronic systolic/diastolic heart failure, with most recent echocardiogram on 01/01/2024 notable for LVEF 30 to 35%, moderately dilated left ventricular internal cavity size, grade 2 diastolic dysfunction, normal right ventricular systolic function, severely dilated left atrium, mildly dilated right atrium, severe mitral regurgitation and moderate to severe tricuspid regurgitation.  It also appears that he experienced a VT/VF arrest in Dec 2017, and subsequently underwent AICD placement in January 2018 for secondary prevention.   Conveys that he is a lifelong non-smoker, denies any known baseline supplemental  oxygen requirements.   The patient was seen in the emergency department 2 days ago, on 05/01/2024, complaining of dysuria.  Workup in the ED was reported to be suggestive of urinary tract infection, the patient was subsequent discharged home from the ED on oral antibiotics.  The urine culture from the associated urine specimen on 05/01/2024, was reportedly felt to be contaminated per updated result.   In spite of good interval compliance with the oral antibiotic, the patient conveys that he continues to experience dysuria.  Denies any associated abdominal discomfort, nor any recent diarrhea, neck stiffness.  Over the last 3 to 4 days, he also notes generalized weakness in the absence of any acute focal weakness.  Urine culture from approximately 1 month ago was reported to be positive for E. coli.     ED Course:  Vital signs in the ED were notable for the following: Afebrile; heart rates in the 80s; systolic blood pressures in the 140s to 150s; respiratory rate 18-28, initial oxygen saturation 78% on room air, Sosan improving to 100% on 2 L nasal cannula.  Labs were notable for the following: BMP was notable for the following: Sodium 137, potassium 5.4, bicarbonate 19, anion gap 18, creatinine 2.72 compared to administration prior creatinine data point 1.71 05/01/2024, glucose 76.  BNP 2633 compared to 2100 on 04/20/2024, and also relative to 1463 on 04/17/2024.  High-sensitivity troponin T initially 61, with repeat value trending down to 53.  CBC notable for white blood cell count of 12,400 compared to 8600 on 05/01/2024.  Today's urinalysis was associated with a hazy appearing specimen with greater than 50 white blood cells, no squamous epithelial cells, greater than 300 protein, and showed 21-50 RBCs.  Per my interpretation, EKG in ED demonstrated the following: Sinus rhythm with nonspecific intraventricular conduction delay  with heart rate 80, and no evidence of T wave or ST changes, no evidence of ST  elevation.  Imaging in the ED, per corresponding formal radiology read, was notable for the following: 1 view chest x-ray showed cardiomegaly without evidence of acute cardiopulmonary process.  While in the ED, the following were administered: Lokelma  10 g p.o. x 1 dose, Rocephin .  Subsequently, the patient was admitted for further evaluation management of suspected presenting acute on chronic systolic/diastolic heart failure complicated by acute hypoxic respiratory distress as well as severe sepsis due to acute cystitis, acute kidney injury superimposed on CKD 3B, mild hyperkalemia.      Review of Systems: As per HPI otherwise 10 point review of systems negative.   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 09/2016 s/p MDT Visia AF MRI single lead ICD (ser # [id number]).   Arthritis    maybe in his spine (09/05/2016)   Asthma    pt. denies   Cardiac arrest (HCC)    a. 08/2016 VT/VF Arrest-->09/2016 s/p MDT Visia AF MRI single lead ICD (ser # [id number]).   Chronic HFrEF (heart failure with reduced ejection fraction) (HCC)    a. 08/2016 Echo: EF 35-40%; b. 04/2018 Echo: EF 30-35%; c. 04/2020 Echo: EF 30-35%; d. 11/2021 Echo: EF 40-45%, GrI DD, nl RV fxn, sev dil LA, mild-mod MR, mild-mod AI, Ao root 38mm.   Coronary artery disease    a. 08/2016 VT Arrest/Cath: LM nl, LAD nl, LCX nl, RGCA 90p, 100d w/ L->R collats, EF 35-45%.   Gout    Hyperlipidemia LDL goal <70    Hypertension    Ischemic cardiomyopathy    a. 08/2016 Echo: EF 35-40%; b. 04/2018 Echo: EF 30-35%; c. 04/2020 Echo: EF 30-35%; d. 11/2021 Echo: EF 40-45%.   LBBB (left bundle branch block)    OSA on CPAP    does not wear CPAP   PAF (paroxysmal atrial fibrillation) (HCC)    a. CHA2DS2VASc = 5-->Eliquis .   Type II diabetes mellitus (HCC)     Past Surgical History:  Procedure Laterality Date   CARDIAC CATHETERIZATION N/A 09/07/2016   Procedure: Left Heart  Cath and Coronary Angiography;  Surgeon: Peter M Swaziland, MD;  Location: [facility] INVASIVE CV LAB;  Service: Cardiovascular;  Laterality: N/A;   COLONOSCOPY WITH PROPOFOL  N/A 08/30/2018   Procedure: COLONOSCOPY WITH PROPOFOL ;  Surgeon: Rollin Dover, MD;  Location: [facility] ENDOSCOPY;  Service: Endoscopy;  Laterality: N/A;   COLONOSCOPY WITH PROPOFOL  N/A 11/16/2023   Procedure: COLONOSCOPY WITH PROPOFOL ;  Surgeon: Rollin Dover, MD;  Location: [facility] ENDOSCOPY;  Service: Gastroenterology;  Laterality: N/A;   COLONOSCOPY WITH PROPOFOL  N/A 11/18/2023   Procedure: COLONOSCOPY WITH PROPOFOL ;  Surgeon: Charlanne Groom, MD;  Location: [facility] ENDOSCOPY;  Service: Gastroenterology;  Laterality: N/A;   EP IMPLANTABLE DEVICE N/A 10/10/2016   Procedure: ICD Implant;  Surgeon: Will Gladis Norton, MD;  Location: [facility] INVASIVE CV LAB;  Service: Cardiovascular;  Laterality: N/A;   HEMOSTASIS CLIP PLACEMENT  11/16/2023   Procedure: HEMOSTASIS CLIP PLACEMENT;  Surgeon: Rollin Dover, MD;  Location: [facility] ENDOSCOPY;  Service: Gastroenterology;;   HEMOSTASIS CLIP PLACEMENT  11/18/2023   Procedure: HEMOSTASIS CLIP PLACEMENT;  Surgeon: Charlanne Groom, MD;  Location: [facility] ENDOSCOPY;  Service: Gastroenterology;;   HEMOSTASIS CONTROL  11/18/2023   Procedure: HEMOSTASIS CONTROL;  Surgeon: Charlanne Groom, MD;  Location: [facility] ENDOSCOPY;  Service: Gastroenterology;;  PuraStat   LUMBAR LAMINECTOMY/DECOMPRESSION MICRODISCECTOMY N/A 01/18/2018   Procedure: L2-3, L3-4,  L-4-5 CENTRAL DECOMPRESSION;  Surgeon: Barbarann Oneil BROCKS, MD;  Location: [facility] OR;  Service: Orthopedics;  Laterality: N/A;   LUMBAR LAMINECTOMY/DECOMPRESSION MICRODISCECTOMY N/A 08/12/2020   Procedure: THORACIC ELEVEN LAMINECTOMY;  Surgeon: Louis Shove, MD;  Location: [facility] OR;  Service: Neurosurgery;  Laterality: N/A;   POLYPECTOMY  08/30/2018   Procedure: POLYPECTOMY;  Surgeon: Rollin Dover, MD;  Location: [facility] ENDOSCOPY;  Service: Endoscopy;;   POLYPECTOMY  11/16/2023   Procedure: POLYPECTOMY;  Surgeon: Rollin Dover, MD;  Location: [facility] ENDOSCOPY;  Service: Gastroenterology;;    Social History:  reports that he has never smoked. He has never used smokeless tobacco. He reports that he does not drink alcohol and does not use drugs.   Allergies  Allergen Reactions   Contrast Media [Iodinated Contrast Media] Shortness Of Breath and Nausea And Vomiting   Lisinopril  Cough    Family History  Problem Relation Age of Onset   Heart attack Father        died in his 27's   Hypertension Sister    Cancer Brother        uncertain, leg   Hypertension Sister     Family history reviewed and not pertinent    Prior to Admission medications   Medication Sig Start Date End Date Taking? Authorizing Provider  allopurinol  (ZYLOPRIM ) 300 MG tablet Take 3 tablets by mouth daily. 06/13/23   [provider]  amiodarone  (PACERONE ) 200 MG tablet Take 1 tablet (200 mg total) by mouth daily. 09/06/22   Ursuy, Renee Lynn, PA-C  apixaban  (ELIQUIS ) 5 MG TABS tablet TAKE 1 TABLET BY MOUTH TWICE A DAY 05/01/24   Camnitz, Soyla Lunger, MD  atorvastatin  (LIPITOR) 40 MG tablet Take 40 mg by mouth daily.    [provider]  carvedilol  (COREG ) 12.5 MG tablet TAKE 1 TABLET (12.5MG  TOTAL) BY MOUTH TWICE A DAY WITH MEALS 08/28/23   Leverne Charlies Helling, PA-C  cholecalciferol  (VITAMIN D3) 25 MCG (1000 UNIT) tablet Take 1,000 Units by mouth daily. 06/13/23   [provider]  ferrous sulfate  325 (65 FE) MG tablet Take 325 mg by mouth daily. 11/08/23   [provider]  furosemide  (LASIX ) 20 MG tablet TAKE 1 TABLET BY MOUTH EVERY DAY 05/01/24   Hilty, Vinie BROCKS, MD  furosemide  (LASIX ) 40 MG tablet Take 1 tablet (40 mg total) by mouth daily. 05/02/24   Yolande Lamar BROCKS, MD  guaiFENesin -codeine  100-10 MG/5ML syrup Take 5 mLs by mouth at bedtime as needed for cough. 05/02/24   Yolande Lamar BROCKS, MD  polyethylene glycol (MIRALAX  / GLYCOLAX ) 17 g packet Take 17 g by mouth daily. 04/23/24   Adhikari, Amrit, MD   valsartan  (DIOVAN ) 40 MG tablet Take 1 tablet (40 mg total) by mouth daily. 11/27/23   Vicci Rollo SAUNDERS, PA-C     Objective    Physical Exam: Vitals:   05/03/24 2120 05/03/24 2131 05/03/24 2247 05/03/24 2300  BP:   (!) 143/85 (!) 154/78  Pulse: 80  83 85  Resp:   (!) 28 15  Temp:      SpO2: 100%  100% 100%  Weight:  88.5 kg    Height:  5' 6 (1.676 m)      General: appears to be stated age; alert, oriented Skin: warm, dry, no rash Head:  AT/[place] Mouth:  Oral mucosa membranes appear moist, normal dentition Neck: supple; trachea midline Heart:  RRR; did not appreciate any M/R/G Lungs: CTAB, did not appreciate any wheezes, rales, or rhonchi Abdomen: + BS;  soft, ND, NT Vascular: 2+ pedal pulses b/l; 2+ radial pulses b/l Extremities: 1-2+ edema in b/l LE's, no muscle wasting    Labs on Admission: I have personally reviewed following labs and imaging studies  CBC: Recent Labs  Lab 05/02/24 0545 05/03/24 2146  WBC 8.6 12.4*  NEUTROABS 6.2  --   HGB 11.1* 12.0*  HCT 36.1* 39.7  MCV 93.8 94.3  PLT 149* 121*   Basic Metabolic Panel: Recent Labs  Lab 05/02/24 0545 05/03/24 2146  NA 138 137  K 4.5 5.4*  CL 106 100  CO2 21* 19*  GLUCOSE 101* 76  BUN 25* 41*  CREATININE 1.72* 2.72*  CALCIUM  9.7 10.1   GFR: Estimated Creatinine Clearance: 24.8 mL/min (A) (by C-G formula based on SCr of 2.72 mg/dL (H)). Liver Function Tests: Recent Labs  Lab 05/02/24 0545  AST 23  ALT 23  ALKPHOS 65  BILITOT 1.2  PROT 6.7  ALBUMIN  3.3*   No results for input(s): LIPASE, AMYLASE in the last 168 hours. No results for input(s): AMMONIA in the last 168 hours. Coagulation Profile: No results for input(s): INR, PROTIME in the last 168 hours. Cardiac Enzymes: No results for input(s): CKTOTAL, CKMB, CKMBINDEX, TROPONINI in the last 168 hours. BNP (last 3 results) No results for input(s): PROBNP in the last 8760 hours. HbA1C: No results for input(s):  HGBA1C in the last 72 hours. CBG: No results for input(s): GLUCAP in the last 168 hours. Lipid Profile: No results for input(s): CHOL, HDL, LDLCALC, TRIG, CHOLHDL, LDLDIRECT in the last 72 hours. Thyroid Function Tests: No results for input(s): TSH, T4TOTAL, FREET4, T3FREE, THYROIDAB in the last 72 hours. Anemia Panel: No results for input(s): VITAMINB12, FOLATE, FERRITIN, TIBC, IRON, RETICCTPCT in the last 72 hours. Urine analysis:    Component Value Date/Time   COLORURINE YELLOW 05/03/2024 2251   APPEARANCEUR HAZY (A) 05/03/2024 2251   LABSPEC >1.030 (H) 05/03/2024 2251   PHURINE 5.5 05/03/2024 2251   GLUCOSEU NEGATIVE 05/03/2024 2251   HGBUR LARGE (A) 05/03/2024 2251   BILIRUBINUR NEGATIVE 05/03/2024 2251   KETONESUR NEGATIVE 05/03/2024 2251   PROTEINUR >300 (A) 05/03/2024 2251   NITRITE NEGATIVE 05/03/2024 2251   LEUKOCYTESUR SMALL (A) 05/03/2024 2251    Radiological Exams on Admission: DG Chest Port 1 View Result Date: 05/03/2024 CLINICAL DATA:  Shortness of breath EXAM: PORTABLE CHEST 1 VIEW COMPARISON:  Chest x-ray 05/02/2024 FINDINGS: The heart is enlarged, unchanged. There are atherosclerotic calcifications of the aorta. Left-sided pacemaker is again seen. There is no focal lung infiltrate, pleural effusion or pneumothorax. No acute fractures are seen. IMPRESSION: 1. No active disease. 2. Cardiomegaly. Electronically Signed   By: Greig Pique M.D.   On: 05/03/2024 23:09   DG Chest Portable 1 View Result Date: 05/02/2024 CLINICAL DATA:  Shortness of breath. EXAM: PORTABLE CHEST 1 VIEW COMPARISON:  04/20/2024 FINDINGS: The cardio pericardial silhouette is enlarged. There is pulmonary vascular congestion without overt pulmonary edema. The lungs are clear without focal pneumonia, edema, pneumothorax or pleural effusion. Left-sided single lead pacer/AICD noted. Telemetry leads overlie the chest. IMPRESSION: Enlargement of the cardiopericardial  silhouette with pulmonary vascular congestion. No overt pulmonary edema on the current study. Electronically Signed   By: Camellia Candle M.D.   On: 05/02/2024 05:29      Assessment/Plan   Principal Problem:   Acute on chronic combined systolic and diastolic heart failure (HCC) Active Problems:   DM2 (diabetes mellitus, type 2) (HCC)   Acute cystitis   Acute  renal failure superimposed on stage 3b chronic kidney disease (HCC)   Generalized weakness   HLD (hyperlipidemia)   Acute hypoxic respiratory failure (HCC)   Severe sepsis (HCC)   Hyperkalemia   SOB (shortness of breath)   Elevated troponin   High anion gap metabolic acidosis   Paroxysmal atrial fibrillation (HCC)   History of essential hypertension     #) Acute on chronic systolic/diastolic heart failure: dx of acute decompensation on the basis of presenting 2 days of progressive shortness of breath associated with worsening of peripheral edema, interval increase in BNP, as quantified above, as well as new supplemental oxygen requirement, now requiring 2 L nasal cannula, as further detailed below. This is in the context of a known history of dilated cardiomyopathy with chronic systolic/diastolic heart failure, with most recent echocardiogram performed in April 2025, which is notable for LVEF 30 to 35%, grade 2 diastolic dysfunction, as well as severe mitral regurgitation and moderate to severe tricuspid regurgitation, with additional results as conveyed above. Etiology leading to presenting acutely decompensated heart failure not entirely clear at this time.  I suspect that mildly elevated initial troponin is a consequence of underlying acutely decompensated heart failure as well as contribution from diminished oxygen delivery capacity in the context of his presenting acute hypoxic respiratory distress, along with a contribution from diminished renal clearance of troponin in the setting of acute kidney injury superimposed on CKD 3B,  as opposed to representing ACS causing presenting acute heart failure exacerbation, particularly in the absence of any recent CP, and with presenting EKG showing no evidence of acute ischemic changes.   He reports good compliance with his outpatient diuretic regimen consisting of Lasix .  Additional medical management of his chronic systolic heart failure includes outpatient Coreg  as well as valsartan .  However, in the setting of acute kidney injury as well as hyperkalemia, will hold the next doses of valsartan  and Coreg , as further detailed below.   Plan: monitor strict I's & O's and daily weights. Monitor on telemetry.  CMP in the morning, including for monitoring trend of potassium, bicarbonate, and renal function in response to interval diuresis efforts. Add-on serum magnesium  level, and repeat this level in the AM. Close monitoring of ensuing blood pressure response to diuresis efforts, including to help guide need for improvement in afterload reduction in order to optimize cardiac output.  Lasix  20 mg IV twice daily, first dose now, although he may subsequent warrant a higher dose of IV Lasix  relative to this.  Hyperkalemia, hold next doses of Coreg  and valsartan , as above.  Recheck BMP in the morning.                      #) Acute hypoxic respiratory distress: in the context of acute respiratory symptoms and no known baseline supplemental O2 requirements, presenting O2 sat was noted to be in the high 70s on room air, subsequently improving to 100% on 2 L nasal cannula, thereby meeting criteria for acute hypoxic respiratory distress as opposed to acute hypoxic respiratory failure at this time. Appears to be on basis of acute on chronic systolic/diastolic heart failure, as above. Presenting CXR shows no acute cardiopulmonary process, including no evidence of pneumothorax.  No known chronic underlying pulmonary conditions.  As noted above, he has mildly elevated troponin, that is now  trending down, appears more consistent with type 2 supply/demand mismatch as opposed to a type I process due to acute plaque rupture.  Overall, ACS felt to  be less likely, as further detailed above, and presenting EKG shows no evidence of STEMI.  In terms of other considered etiologies, clinically, presentation is less suggestive of acute PE at this time, with the latter rendered further less likely in the context of the patient's good compliance with chronic anticoagulation on Eliquis .  Plan: further evaluation/management of presenting acute on chronic systolic/diastolic heart failure, including IV diuresis, as above.  monitor on telemetry. CMP/CBC in the AM. Check serum Mg and Phos levels.  Add on procalcitonin level.  Recheck BMP in the morning.                     #) Severe sepsis due to acute cystitis: In the setting of a total of 3 to 4 days of dysuria, the patient was diagnosed with acute cystitis 2 days ago, on 05/01/2024 after presenting to the ED with urinalysis at that time suggestive of UTI given significant pyuria.  He was subsequent discharged to home on oral antibiotics.  However, there is concern for suboptimal response to the oral antibiotics given the patient's persistence of dysuria in spite of good interval compliance with oral antibiotics, with this evening's updated urinalysis still demonstrating significant pyuria.  As noted above, the urine culture collected on 05/01/2024 appeared to be contaminated, while urine culture from approximately 1 month ago demonstrated E. Coli.  No evidence of significant flank or acute back discomfort to increase index of suspicion for acute pyelonephritis.  SIRS criteria met via interval development of leukocytosis, as quantified above as well as tachypnea.  The patient received a dose of Rocephin  in the ED this evening.  Prior to administration of additional IV antibiotics, I will order collection of stat blood cultures x 2 now as well as  collection of stat lactic acid every 3 hours x 2 occurrences.   Of note, given the associated presence of suspected end organ damage in the form of concominant presenting acute kidney injury superimposed on CKD 3B, criteria are met for pt's sepsis to be considered severe in nature. However, in the absence of lactic acid level that is greater than or equal to 4.0, and in the absence of any associated hypotension refractory to IVF's, there are no indications for administration of a 30 mL/kg IVF bolus at this time.  Of note, regardless of ensuing lactic acid resolved, we will refrain from any aggressive IV fluids given concomitant presenting acute on chronic systolic/diastolic heart failure.  No e/o additional infectious process at this time, including chest x-ray which shows no evidence of infiltrate to suggest underlying pneumonia.  Will add on procalcitonin level to further assess.  Plan: CBC w/ diff and CMP in AM.  I have ordered stat blood cultures x 2 as well as stat lactic acid every 3 hours x 2 occurrences.  Subsequent to collection of these blood cultures, I have ordered Rocephin . Will follow for result of anticipated reflex urine culture.  Refraining from IV fluids, as above.  Add on procalcitonin level.  As needed acetaminophen  for fever.  Monitor on telemetry.                   #) Acute Kidney Injury superimposed on CKD 3B:   Relative to the patient's history of CKD 3B with baseline creatinine range 1.7-2.2, presenting creatinine is 2.72, representing an interval increase from most recent prior value of 1.72 on 05/01/2024.  Suspect contribution from diminished renal perfusion gradient as a result of presenting acute on chronic systolic/diastolic  heart failure.  There may also be a contribution from further decline in renal perfusion as a result of diminished oxygen delivery capacity in the setting of acute hypoxic respiratory distress there is also the possibility of pharmacologic  exacerbation in the setting of outpatient valsartan . Will also review the specific nature of the oral antibiotic upon which the patient was discharged from the ED on 05/01/2024.   Plan: monitor strict I's & O's and daily weights. Attempt to avoid nephrotoxic agents.  Hold home valsartan  for now . refrain from NSAIDs. Repeat CMP in the morning. Check serum magnesium  level. Add-on random urine sodium and random urine creatinine.  Further evaluation management of acute on chronic systolic/diastolic heart failure, including IV diuresis via IV Lasix , as above.                    #) Hyperkalemia: presenting serum potassium level of 5.4, without corresponding EKG changes noted.  There appears to be a contribution from his presenting acute kidney injury superimposed on CKD 3B, as well as secondary contribution from resultant anion gap metabolic acidosis resulting in extracellular shift of potassium.  Additionally, there may be multiple pharmacologic contributions, including use of outpatient Coreg  as well as valsartan .  EDP is ordered Lokelma  10 g p.o. x 1 dose.  No context of the patient's metabolic acidosis, will also provide a dose of IV bicarbonate, and closely monitor considering potassium trend to guide additional medical management, as below.  Included in this, will check CMP to evaluate patient's calcium  level before administering any calcium  gluconate.  Is also noted that the patient will be receiving IV Lasix  in the interval.  Plan: monitor on telemetry. Add-on serum Mg level. Check phosphorus. Recheck CMP and serum Mg in the AM.  IV Lasix , as above.  Sodium bicarbonate  1 amp IV x 1 dose now.  Further evaluation management of AKI on CKD 3B, as above.  Hold home valsartan  and Coreg  for now.                    #) Generalized weakness: 3 to 4 days duration of generalized weakness, in the absence of any evidence of acute focal neurologic deficits, including no evidence of  acute focal weakness to suggest acute CVA.  Suspect contribution from physiologic stress stemming from presenting severe sepsis due to acute cystitis as well as presenting acute on chronic systolic/diastolic heart failure.    Plan: work-up and management of presenting severe sepsis due to acute cystitis as well as acute on chronic systolic/diastolic heart failure, as described above. PT/OT consults ordered for the AM. Fall precautions. CMP/CBC in the AM. Check serum Mg level.  Check procalcitonin level.                        #) Paroxysmal atrial fibrillation: Documented history of such. In setting of CHA2DS2-VASc score of  4, there is an indication for chronic anticoagulation for thromboembolic prophylaxis. Consistent with this, patient is chronically anticoagulated on Eliquis . Home AV nodal blocking regimen: Coreg , the next dose of which will be held in the setting of presenting hyperkalemia.  Most recent echocardiogram was performed in April 2025, and notable for severely dilated left atrium, conferring increased likelihood of persistent versus chronic atrial fibrillation. Presenting EKG is suggestive of sinus rhythm without overt evidence of acute ischemic changes.  He is also on amiodarone  as an outpatient.  Plan: monitor strict I's & O's and daily weights. CMP/CBC in  AM. Check serum mag level. Continue home Eliquis  and amiodarone .  Hold next dose of Coreg  in the setting of hyperkalemia.  Monitor on telemetry.                     #) Essential Hypertension: documented h/o such, with outpatient antihypertensive regimen including valsartan , Coreg , Lasix .  SBP's in the ED today: 140s to 150s mmHg. in the setting of presenting acute kidney injury as well as hyperkalemia, will hold home Coreg  and losartan  for now.  Additionally, we will proceed with IV Lasix  in the context of presenting acute on chronic systolic/diastolic heart failure  Plan: Close monitoring of  subsequent BP via routine VS. hold home Coreg , valsartan , or Lasix , as above.  IV Lasix , as above.  Monitor strict I's and O's and daily weights.  Monitor on telemetry.                      #) Hyperlipidemia: documented h/o such. On high intensity atorvastatin  as outpatient.   Plan: continue home statin.                       #) Type 2 Diabetes Mellitus: documented history of such.  Now appears to be managed via lifestyle modifications in the absence of any outpatient use of oral hypoglycemic agents or insulin , noting most recent hemoglobin A1c level of 5.1% when checked in February 2025.  Presenting blood sugar 76.  Plan: accuchecks QAC and HS with low dose SSI.  Add on hemoglobin A1c level.      DVT prophylaxis: SCD's in addition to resumption of home Eliquis  Code Status: Full code Family Communication: none Disposition Plan: Per Rounding Team Consults called: none;  Admission status: Inpatient     I SPENT GREATER THAN 75  MINUTES IN CLINICAL CARE TIME/MEDICAL DECISION-MAKING IN COMPLETING THIS ADMISSION.      Dean Hypes B Sadhana Frater DO [facility]  From 7PM - 7AM   05/04/2024, 12:32 AM

## 2024-05-04 NOTE — ED Provider Notes (Signed)
 ED ECG REPORT   Date: 05/03/2024 2128  Rate: 80  Rhythm: normal sinus rhythm  QRS Axis: left  Intervals: QT prolonged1st degree block  ST/T Wave abnormalities: nonspecific ST changes  Conduction Disutrbances:nonspecific intraventricular conduction delay  Narrative Interpretation: significant artifact limits evaluation  Old EKG Reviewed: unchanged  I have personally reviewed the EKG tracing and agree with the computerized printout as noted.    Midge Golas, MD 05/04/24 [phone]

## 2024-05-04 NOTE — Assessment & Plan Note (Addendum)
 Resume carvedilol  for rate control and continue anticoagulation with apixaban  Continue with amiodarone 

## 2024-05-04 NOTE — Assessment & Plan Note (Signed)
 Calculated BMI is 31, 4

## 2024-05-04 NOTE — Progress Notes (Addendum)
 TRH Update:    I was notified by the patient's RN of potassium level of 6.6. it appears that there was some hemolysis associated with this AM's CMP. Will ordered a BMP to try to further assess the potassium level. Will also check an updated EKG at this time.   CMP also notable for transaminitis. In setting of criteria met for severe sepsis, will pursue CT abd/pelvis to further evaluate.   LA is starting to trend down. 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 I ordered a repeat LA level for later this morning to further trend.    Update: istat chem8 shows potassium level of 5.1, which is down from potassium of 5.4 at time of admission.     Eva Pore, DO Hospitalist

## 2024-05-04 NOTE — Assessment & Plan Note (Addendum)
 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  LV posterior wall and basal inferior segment akinetic.   Urine output documented 350 ml Systolic blood pressure 95 to 899 mmHg. Lactate continue elevated at 2,5   Patient continue volume overloaded, has not responded well to IV furosemide  high dose.  Signs of hypoperfusion with elevated lactic acid and worsening renal function, hypotension.   At this point not able to use afterload reduction due to risk of hypotension.  Plan to place PICC line and consult heart failure team. Check SV02  Patient may need inotropic support to augment diuresis.   Congestive hepatopathy.  03/2024 live US  with cholelithiasis, and benign hepatic hemangiomas.  Trending down liver enzymes but continue very elevated, ALT 1680 and AST 1708, total bilirubin 1.3   No signs of encephalopathy.

## 2024-05-05 DIAGNOSIS — I5023 Acute on chronic systolic (congestive) heart failure: Secondary | ICD-10-CM | POA: Diagnosis not present

## 2024-05-05 DIAGNOSIS — I10 Essential (primary) hypertension: Secondary | ICD-10-CM | POA: Diagnosis not present

## 2024-05-05 DIAGNOSIS — I480 Paroxysmal atrial fibrillation: Secondary | ICD-10-CM | POA: Diagnosis not present

## 2024-05-05 DIAGNOSIS — I2510 Atherosclerotic heart disease of native coronary artery without angina pectoris: Secondary | ICD-10-CM | POA: Diagnosis not present

## 2024-05-05 LAB — BLOOD CULTURE ID PANEL (REFLEXED) - BCID2

## 2024-05-05 LAB — BASIC METABOLIC PANEL WITH GFR
Anion gap: 10 (ref 5–15)
BUN: 55 mg/dL — ABNORMAL HIGH (ref 8–23)
CO2: 24 mmol/L (ref 22–32)
Calcium: 8.8 mg/dL — ABNORMAL LOW (ref 8.9–10.3)
Chloride: 100 mmol/L (ref 98–111)
Creatinine, Ser: 2.77 mg/dL — ABNORMAL HIGH (ref 0.61–1.24)
GFR, Estimated: 23 mL/min — ABNORMAL LOW (ref >60)
Glucose, Bld: 133 mg/dL — ABNORMAL HIGH (ref 70–99)
Potassium: 4.2 mmol/L (ref 3.5–5.1)
Sodium: 134 mmol/L — ABNORMAL LOW (ref 135–145)

## 2024-05-05 LAB — HEPATIC FUNCTION PANEL
ALT: 1708 U/L — ABNORMAL HIGH (ref 0–44)
AST: 1680 U/L — ABNORMAL HIGH (ref 15–41)
Albumin: 3 g/dL — ABNORMAL LOW (ref 3.5–5.0)
Alkaline Phosphatase: 82 U/L (ref 38–126)
Bilirubin, Direct: 1.3 mg/dL — ABNORMAL HIGH (ref 0.0–0.2)
Indirect Bilirubin: 1.4 mg/dL — ABNORMAL HIGH (ref 0.3–0.9)
Total Bilirubin: 2.7 mg/dL — ABNORMAL HIGH (ref 0.0–1.2)
Total Protein: 6 g/dL — ABNORMAL LOW (ref 6.5–8.1)

## 2024-05-05 LAB — CBC
HCT: 34.2 % — ABNORMAL LOW (ref 39.0–52.0)
Hemoglobin: 10.8 g/dL — ABNORMAL LOW (ref 13.0–17.0)
MCH: 28.9 pg (ref 26.0–34.0)
MCHC: 31.6 g/dL (ref 30.0–36.0)
MCV: 91.4 fL (ref 80.0–100.0)
Platelets: 52 K/uL — ABNORMAL LOW (ref 150–400)
RBC: 3.74 MIL/uL — ABNORMAL LOW (ref 4.22–5.81)
RDW: 15.6 % — ABNORMAL HIGH (ref 11.5–15.5)
WBC: 10.2 K/uL (ref 4.0–10.5)
nRBC: 0 % (ref 0.0–0.2)

## 2024-05-05 LAB — GLUCOSE, CAPILLARY
Glucose-Capillary: 106 mg/dL — ABNORMAL HIGH (ref 70–99)
Glucose-Capillary: 146 mg/dL — ABNORMAL HIGH (ref 70–99)
Glucose-Capillary: 157 mg/dL — ABNORMAL HIGH (ref 70–99)
Glucose-Capillary: 112 mg/dL — ABNORMAL HIGH (ref 70–99)

## 2024-05-05 LAB — LACTIC ACID, PLASMA: Lactic Acid, Venous: 2.4 mmol/L (ref 0.5–1.9)

## 2024-05-05 LAB — HEMOGLOBIN A1C
Hgb A1c MFr Bld: 5.4 % (ref 4.8–5.6)
Mean Plasma Glucose: 108 mg/dL

## 2024-05-05 LAB — MAGNESIUM: Magnesium: 2.1 mg/dL (ref 1.7–2.4)

## 2024-05-05 MED ORDER — FUROSEMIDE 10 MG/ML IJ SOLN
80.0000 mg | Freq: Two times a day (BID) | INTRAMUSCULAR | Status: DC
  Administered 2024-05-05 – 2024-05-06 (×6): 80 mg via INTRAVENOUS
  Filled 2024-05-05 (×3): qty 8

## 2024-05-05 NOTE — Progress Notes (Signed)
 Progress Note   Patient: Dean Cabello Sr. FMW:[id number] DOB: 09-29-1950 DOA: 05/03/2024     1 DOS: the patient was seen and examined on 05/05/2024   Brief hospital course: Mr. Vanhouten was admitted to the hospital with the working diagnosis of heart failure decompensation.   73 yo male with the past medical history of heart failure, paroxysmal atrial fibrillation, hyperlipidemia, CKD 3b and T2DM who presented with dyspnea. Reported 2 days of progressive dyspnea along with lower extremity edema.  08/07 diagnosed with urinary tract infection and placed on antibiotic therapy.  On his initial physical examination his blood pressure was 143/85, HR 85, RR 28 and 02 saturation 100% Lungs with no wheezing or rhonchi, heart with S1 and S2 present and regular, abdomen with no distention and positive lower extremity edema.   Na 137 K 5,4 Cl 100 bicarbonate 19, glucose 76 bun 41 cr 2,72  P 2.2  BNP 2,633  High sensitive troponin 61 and 53  Wbc 12,4 hgb 12,0 plt 121   Urine analysis SG >1.030, protein > 300, large hgb and small leukocytes, > 50 wbc and 21-50 rbc   Chest radiograph with hypoinflation, left rotation, bilateral hilar vascular congestion, with fluid in the right fissure, positive pacemaker defibrillator in place with one right ventricle lead.   EKG 80 bpm, left axis deviation, qtc 528, left bundle branch block, sinus rhythm with 1st degree AV block, with no significant ST segment or T wave changes.   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  LV posterior wall  and basal inferior segment akinetic.   Urine output is 850 ml documented  Systolic blood pressure 130 mmHg range.   Plan to continue diuresis with IV furosemide , increase to 80 mg IV bid  Check lactic acid.  Discontinue carvedilol .   Holding ARB until renal function more stable.   Essential hypertension Discontinue carvedilol .  May add further afterload reduction as tolerated by renal function   Paroxysmal atrial fibrillation (HCC) Positive bradycardia, discontinue carvedilol  and continue with amiodarone  Continue anticoagulation with apixaban .   CAD (coronary artery disease) No active chest pain.  High sensitive troponin elevation due to heart failure, no acute coronary syndrome.   Chronic kidney disease, stage 3b (HCC) AKI, hyperkalemia. Hyponatremia.   Renal function with serum cr at 2.7 with K at 4,2 and serum bicarbonate at 24  Na 134 and Mg 2.1   Plan to continue diuresis with furosemide , increase to 80 mg IV bid. Hold on ARB for now.   Type 2 diabetes mellitus with hyperlipidemia (HCC) Continue glucose cover and monitoring with insulin  sliding scale Fasting glucose 133 today  Patient is tolerating po well   Continue statin therapy  Acute cystitis Past urine culture positive for E coli sensitive for cephalosporins, continue cephalexin  for 3 days total .  Obesity, class 1 Calculated BMI is 31,4      Subjective: Patient with no chest pain, his dyspnea and edema are improving but not back to baseline   Physical Exam: Vitals:   05/04/24 2000 05/04/24 2013 05/05/24 0028 05/05/24 0400  BP: (!) 110/51  (!) 132/50 123/74  Pulse: (!) 56 (!) 57 ROLLEN)  56   Resp:   20 20  Temp: 98.5 F (36.9 C)   98.3 F (36.8 C)  TempSrc:   Oral Oral  SpO2: 97% 96% 99%   Weight:    89.9 kg  Height:       Neurology awake and alert ENT with mild pallor Cardiovascular with S1 and S2 present and regular, positive systolic murmur at the right lower sternal border Respiratory with  rales bilaterally with no wheezing or rhonchi  Abdomen protuberant, non tender and not distended Positive lower extremity edema ++  Data Reviewed:    Family Communication: no family at the bedside   Disposition: Status is: Inpatient Remains inpatient appropriate because: IV diuresis   Planned Discharge Destination: Home     Author: Elidia Toribio Furnace, MD 05/05/2024 7:34 AM  For on call review [email or web address].

## 2024-05-05 NOTE — Plan of Care (Signed)
  Problem: Coping: Goal: Ability to adjust to condition or change in health will improve Outcome: Progressing   Problem: Fluid Volume: Goal: Ability to maintain a balanced intake and output will improve Outcome: Progressing   Problem: Metabolic: Goal: Ability to maintain appropriate glucose levels will improve Outcome: Progressing   Problem: Nutritional: Goal: Maintenance of adequate nutrition will improve Outcome: Progressing   Problem: Skin Integrity: Goal: Risk for impaired skin integrity will decrease Outcome: Progressing   Problem: Education: Goal: Knowledge of General Education information will improve Description: Including pain rating scale, medication(s)/side effects and non-pharmacologic comfort measures Outcome: Progressing   Problem: Clinical Measurements: Goal: Ability to maintain clinical measurements within normal limits will improve Outcome: Progressing   Problem: Activity: Goal: Risk for activity intolerance will decrease Outcome: Progressing   Problem: Nutrition: Goal: Adequate nutrition will be maintained Outcome: Progressing   Problem: Coping: Goal: Level of anxiety will decrease Outcome: Progressing   Problem: Pain Managment: Goal: General experience of comfort will improve and/or be controlled Outcome: Progressing   Problem: Safety: Goal: Ability to remain free from injury will improve Outcome: Progressing   Problem: Skin Integrity: Goal: Risk for impaired skin integrity will decrease Outcome: Progressing

## 2024-05-05 NOTE — Progress Notes (Signed)
 SATURATION QUALIFICATIONS: (This note is used to comply with regulatory documentation for home oxygen)  Patient Saturations on Room Air at Rest = 87%  Patient Saturations on Room Air while Ambulating = 90  Patient Saturations on 2 Liters of oxygen while at rest = 96%  Please briefly explain why patient needs home oxygen:Pt requiring O2 at rest and at end of ambulation today.  Jetson Pickrel M,PT Acute Rehab Services [phone]

## 2024-05-05 NOTE — Progress Notes (Signed)
 PHARMACY - PHYSICIAN COMMUNICATION CRITICAL VALUE ALERT - BLOOD CULTURE IDENTIFICATION (BCID)  Dean Majerus Sr. is an 73 y.o. male who presented to [facility] on 05/03/2024 with a chief complaint of shortness of breath and heart failure decompensation. Also concern of acute cystitis.   Assessment:  1 out of 4 blood cultures with GPC. BCID showing methicillin resistant staphylococcus epidermidis. Patient on Ceftin for acute cystitis and wbc trending down, afebrile, and lactic acid trending down.   Name of physician (or Provider) Contacted: Dr. Noralee  Current antibiotics: Ceftin  Changes to prescribed antibiotics recommended:  Possible contaminant. MD repeating blood cultures and not changing antibiotics for now. Continue to monitor.   Results for orders placed or performed during the hospital encounter of 05/03/24  Blood Culture ID Panel (Reflexed) (Collected: 05/04/2024  3:32 AM)  Result Value Ref Range   Enterococcus faecalis NOT DETECTED NOT DETECTED   Enterococcus Faecium NOT DETECTED NOT DETECTED   Listeria monocytogenes NOT DETECTED NOT DETECTED   Staphylococcus species DETECTED (A) NOT DETECTED   Staphylococcus aureus (BCID) NOT DETECTED NOT DETECTED   Staphylococcus epidermidis DETECTED (A) NOT DETECTED   Staphylococcus lugdunensis NOT DETECTED NOT DETECTED   Streptococcus species NOT DETECTED NOT DETECTED   Streptococcus agalactiae NOT DETECTED NOT DETECTED   Streptococcus pneumoniae NOT DETECTED NOT DETECTED   Streptococcus pyogenes NOT DETECTED NOT DETECTED   A.calcoaceticus-baumannii NOT DETECTED NOT DETECTED   Bacteroides fragilis NOT DETECTED NOT DETECTED   Enterobacterales NOT DETECTED NOT DETECTED   Enterobacter cloacae complex NOT DETECTED NOT DETECTED   Escherichia coli NOT DETECTED NOT DETECTED   Klebsiella aerogenes NOT DETECTED NOT DETECTED   Klebsiella oxytoca NOT DETECTED NOT DETECTED   Klebsiella pneumoniae NOT DETECTED NOT DETECTED   Proteus species NOT  DETECTED NOT DETECTED   Salmonella species NOT DETECTED NOT DETECTED   Serratia marcescens NOT DETECTED NOT DETECTED   Haemophilus influenzae NOT DETECTED NOT DETECTED   Neisseria meningitidis NOT DETECTED NOT DETECTED   Pseudomonas aeruginosa NOT DETECTED NOT DETECTED   Stenotrophomonas maltophilia NOT DETECTED NOT DETECTED   Candida albicans NOT DETECTED NOT DETECTED   Candida auris NOT DETECTED NOT DETECTED   Candida glabrata NOT DETECTED NOT DETECTED   Candida krusei NOT DETECTED NOT DETECTED   Candida parapsilosis NOT DETECTED NOT DETECTED   Candida tropicalis NOT DETECTED NOT DETECTED   Cryptococcus neoformans/gattii NOT DETECTED NOT DETECTED   Methicillin resistance mecA/C DETECTED (A) NOT DETECTED    [name] 05/05/2024  6:49 PM

## 2024-05-05 NOTE — TOC Initial Note (Signed)
 Transition of Care (TOC) - Initial/Assessment Note    Patient Details  Name: Dean Tallman Sr. MRN: [id number] Date of Birth: [birth year]  Transition of [facility]) CM/SW Contact:    Sudie Erminio Deems, RN Phone Number: 05/05/2024, 4:34 PM  Clinical Narrative:  Patient presented for shortness of breath. PTA patient was from home with support of daughter. Patient has DME cane and rolling walker in the home. PT/OT worked with the patient and made recommendations for [facility] PT/OT- Case Manager spoke with patient and received verbal permission to call his daughter to discuss disposition plan. Case Manager spoke with Michele and she is agreeable to home health services. Daughter did not have an agency preference; however, wants an agency in network. Case Manager called [facility] and they can service the patient. Start of care to begin within 24-48 hours post transition home. Case Manager sent a secure chat to MD to see if the patient needs oxygen for home. Case Manager will continue to follow for additional needs.                  Expected Discharge Plan: Home w Home Health Services Barriers to Discharge: No Barriers Identified   Patient Goals and CMS Choice Patient states their goals for this hospitalization and ongoing recovery are:: plan is to return home once stable.   Choice offered to / list presented to : Adult Children (daughter did not have a preference- just an agency in network.)      Expected Discharge Plan and Services In-house Referral: NA Discharge Planning Services: CM Consult Post Acute Care Choice: Home Health Living arrangements for the past 2 months: Single Family Home                           HH Arranged: PT, OT HH Agency: [facility] Date [facility] Agency Contacted: 05/05/24 Time HH Agency Contacted: [phone] Representative spoke with at [facility] Agency: Amy  Prior Living Arrangements/Services Living arrangements for the past 2 months: Single Family Home Lives  with:: Adult Children Patient language and need for interpreter reviewed:: Yes Do you feel safe going back to the place where you live?: Yes      Need for Family Participation in Patient Care: Yes (Comment) Care giver support system in place?: Yes (comment) Current home services: DME (cane, rolling walker.) Criminal Activity/Legal Involvement Pertinent to Current Situation/Hospitalization: No - Comment as needed  Activities of Daily Living   ADL Screening (condition at time of admission) Independently performs ADLs?: Yes (appropriate for developmental age) Is the patient deaf or have difficulty hearing?: No Does the patient have difficulty seeing, even when wearing glasses/contacts?: No Does the patient have difficulty concentrating, remembering, or making decisions?: No  Permission Sought/Granted Permission sought to share information with : Family Supports, Magazine features editor, Case Estate manager/land agent granted to share information with : Yes, Verbal Permission Granted     Permission granted to share info w AGENCY: [facility]        Emotional Assessment Appearance:: Appears stated age Attitude/Demeanor/Rapport: Engaged Affect (typically observed): Appropriate Orientation: : Oriented to Self, Oriented to Place Alcohol / Substance Use: Not Applicable Psych Involvement: No (comment)  Admission diagnosis:  Acute on chronic combined systolic and diastolic heart failure (HCC) [I50.43] AKI (acute kidney injury) (HCC) [N17.9] Urinary tract infection without hematuria, site unspecified [N39.0] Acute on chronic congestive heart failure, unspecified heart failure type [facility]) [I50.9] Patient Active Problem List   Diagnosis Date Noted  Acute on chronic systolic CHF (congestive heart failure) (HCC) 05/04/2024   SOB (shortness of breath) 05/04/2024   Elevated troponin 05/04/2024   High anion gap metabolic acidosis 05/04/2024   Paroxysmal atrial fibrillation (HCC) 05/04/2024    Essential hypertension 05/04/2024   Obesity, class 1 05/04/2024   Acute on chronic heart failure (HCC) 04/20/2024   Acute GI bleeding 11/17/2023   Ventricular tachycardia (HCC) 08/24/2022   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   Atrial fibrillation, chronic (HCC) 08/12/2020   Thoracic spine fracture (HCC) 06/29/2020   Hyperkalemia 10/23/2019   Diarrhea 10/23/2019   CHF (congestive heart failure) (HCC) 06/04/2019   Acute systolic CHF (congestive heart failure) (HCC) 06/04/2019   Severe sepsis (HCC) 05/06/2019   Acute asthma exacerbation 05/06/2019   Acute respiratory distress 05/06/2019   Acute hypoxic respiratory failure (HCC) 04/06/2019   HCAP (healthcare-associated pneumonia) 04/06/2019   Acute kidney failure (HCC) 02/27/2019   Chronic combined systolic and diastolic CHF (congestive heart failure) (HCC) 02/27/2019   CKD (chronic kidney disease), stage III (HCC) 05/16/2018   Normochromic normocytic anemia 05/15/2018   CAD (coronary artery disease) 12/17/2017   HLD (hyperlipidemia) 12/17/2017   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   Cardiomyopathy, ischemic    Generalized weakness 10/04/2016   Stage 3 chronic kidney disease (HCC)    Chronic kidney disease, stage 3b (HCC)    Disorientated    Anoxic brain injury (HCC) 09/15/2016   Benign essential HTN    OSA (obstructive sleep apnea)    Transaminitis    Chronic respiratory failure with hypoxia (HCC)    Fever    Acute cystitis    NSVT (nonsustained ventricular tachycardia) (HCC)    Uncomplicated asthma    Type 2 diabetes mellitus with obesity (HCC)    Acute on chronic combined systolic and diastolic CHF (congestive heart failure) (HCC)    Tachypnea    Hypokalemia    Leukocytosis    Hypoxemia    Acute respiratory failure (HCC)    Cardiopulmonary arrest (HCC) 08/27/2016   Type 2 diabetes mellitus with hyperlipidemia (HCC) 03/02/2014   Class 2 obesity due to excess  calories with body mass index (BMI) of 35.0 to 35.9 in adult 03/02/2014   Essential hypertension, benign 03/02/2014   PCP:  [name], MD Pharmacy:   [facility] [phone] [address], [place] - [address] [place] [address] Phone: [phone] Fax: [phone]     Social Drivers of Health (SDOH) Social History: SDOH Screenings   Food Insecurity: No Food Insecurity (05/04/2024)  Housing: Low Risk  (05/04/2024)  Transportation Needs: No Transportation Needs (05/04/2024)  Utilities: Not At Risk (05/04/2024)  Social Connections: Moderately Integrated (05/04/2024)  Recent Concern: Social Connections - Moderately Isolated (04/20/2024)  Tobacco Use: Low Risk  (05/04/2024)   SDOH Interventions:     Readmission Risk Interventions    04/22/2024   11:11 AM  Readmission Risk Prevention Plan  Transportation Screening Complete  PCP or Specialist Appt within 5-7 Days Complete  Home Care Screening Complete  Medication Review (RN CM) Complete

## 2024-05-05 NOTE — Evaluation (Signed)
 Occupational Therapy Evaluation Patient Details Name: Dean Hane Sr. MRN: [id number] DOB: 25-Mar-1951 Today's Date: 05/05/2024   History of Present Illness   Pt is a 73 y.o. male admitted with acute on chronic systolic/diastolic heart failure, acute hypoxic respiratory distress, severe sepsis due to acute cystitis, AKI and mild hyperkalemia. PMH significant for chronic systolic/diastolic heart failure, status post ICD placement in January 2018, paroxysmal AFIB on Eliquis , HTN, CAD, HLD, obstructive sleep apnea noncompliant with CPAP, Type II DM, CKD 3B, & laminectomy     Clinical Impressions Pt admitted with above diagnosis. Prior to hospital admission, pt lives with his children and was mod independent in ADLs/IADLs including driving and working. Pt typically does not use an AD, but occasionally uses RW for support on days where he is fatigued. Pt presents to acute OT with decreased activity tolerance and generalized weakness. Politely declines mobility outside of short distance in room or up in recliner for lunch. Pt requires supervision for bed mobility, CGA fading to supervision for functional STS transfers and was able to demo LB dressing for doffing/donning socks without LOB. Pt performs 15x standing marches using RW for external support, fatigues quickly with increased work of breathing noted.  VSS on 2L O2 via [place]. Educated pt on importance of mobility at increasing intervals throughout the day to support cardiopulmonary recovery and to improve tolerance to activity. Anticipate pt will progress towards [facility] OT as he improves medically. PPt would benefit from skilled OT services to address noted impairments and functional limitations (see below for any additional details) in order to maximize safety and independence while minimizing falls risk and caregiver burden. Anticipate the need for follow up [facility] OT services upon acute hospital DC.    If plan is discharge home, recommend the following:    A little help with walking and/or transfers;A little help with bathing/dressing/bathroom;Assist for transportation;Help with stairs or ramp for entrance;Assistance with cooking/housework     Functional Status Assessment   Patient has had a recent decline in their functional status and demonstrates the ability to make significant improvements in function in a reasonable and predictable amount of time.     Equipment Recommendations   None recommended by OT      Precautions/Restrictions   Precautions Precautions: Fall Restrictions Weight Bearing Restrictions Per Provider Order: No     Mobility Bed Mobility Overal bed mobility: Needs Assistance Bed Mobility: Supine to Sit, Sit to Supine     Supine to sit: Supervision, HOB elevated, Used rails Sit to supine: Supervision, Used rails   General bed mobility comments: HOB elevated, pt able to perform without physical assist. Left in chair position end of session.    Transfers Overall transfer level: Needs assistance Equipment used: Rolling walker (2 wheels) Transfers: Sit to/from Stand Sit to Stand: Contact guard assist, Supervision           General transfer comment: CGA progressing to supervision for STS transfers, cues for hand placement      Balance Overall balance assessment: Needs assistance Sitting-balance support: No upper extremity supported, Feet supported Sitting balance-Leahy Scale: Good Sitting balance - Comments: no LOB reaching outside BOS to demo doffing/donning socks   Standing balance support: Bilateral upper extremity supported, During functional activity Standing balance-Leahy Scale: Fair Standing balance comment: increased work of breathing with standing attempts                           ADL either performed or assessed with  clinical judgement   ADL Overall ADL's : Needs assistance/impaired Eating/Feeding: Independent;Sitting   Grooming: Sitting;Independent   Upper Body  Bathing: Sitting;Set up   Lower Body Bathing: Set up;Sit to/from stand   Upper Body Dressing : Sitting;Set up   Lower Body Dressing: Sit to/from stand;Set up;Supervision/safety Lower Body Dressing Details (indicate cue type and reason): pt able to return demo LB dressing with supervision Toilet Transfer: Contact guard assist;BSC/3in1;Rolling walker (2 wheels) Toilet Transfer Details (indicate cue type and reason): anticipate pt able to progress quickly to mobilize t/f bathroom; pt declining further mobility due to fatigue. recommending BSC or bathroom depending on how pt is feeling Toileting- Clothing Manipulation and Hygiene: Contact guard assist;Sit to/from stand       Functional mobility during ADLs: Contact guard assist;Rolling walker (2 wheels) General ADL Comments: Pt agreeable to limited mobility, declines sitting OOB in chair (says he was up in chair all morning)     Vision Baseline Vision/History: 0 No visual deficits Ability to See in Adequate Light: 0 Adequate Patient Visual Report: No change from baseline              Pertinent Vitals/Pain Pain Assessment Pain Assessment: No/denies pain     Extremity/Trunk Assessment Upper Extremity Assessment Upper Extremity Assessment: Right hand dominant;Overall WFL for tasks assessed (grossly 4/5 throughout, good bilat grip strength)   Lower Extremity Assessment Lower Extremity Assessment: Defer to PT evaluation       Communication Communication Communication: No apparent difficulties   Cognition Arousal: Alert Behavior During Therapy: WFL for tasks assessed/performed, Flat affect Cognition: No family/caregiver present to determine baseline             OT - Cognition Comments: pt generally A&O, provides different info today vs PT eval earlier this week (pt states he lives with family who provide supervision). Pt with overall flat affect, requires encourgement to participate in session.                  Following commands: Intact       Cueing  General Comments   Cueing Techniques: Verbal cues      Exercises Exercises: Other exercises, General Lower Extremity General Exercises - Lower Extremity Hip Flexion/Marching: 15 reps, Both, AAROM, Standing Other Exercises Other Exercises: educated on role and purpose of OT in acute care        Home Living Family/patient expects to be discharged to:: Private residence Living Arrangements: Children Available Help at Discharge: Family;Available 24 hours/day Type of Home: House Home Access: Level entry     Home Layout: Two level;Bed/bath upstairs Alternate Level Stairs-Number of Steps: 13 Alternate Level Stairs-Rails: Right;Left;Can reach both Bathroom Shower/Tub: Chief Strategy Officer: Standard     Home Equipment: Grab bars - tub/shower;Grab bars - toilet;Cane - single point;Shower seat;BSC/3in1;Rolling Walker (2 wheels)          Prior Functioning/Environment Prior Level of Function : Independent/Modified Independent;Driving;Working/employed             Mobility Comments: intermittent use of RW ADLs Comments: independent in ADLs, drives, Holiday representative part time, framing work    OT Problem List: Cardiopulmonary status limiting activity;Decreased activity tolerance;Impaired balance (sitting and/or standing);Decreased knowledge of use of DME or AE   OT Treatment/Interventions: Self-care/ADL training;Therapeutic exercise;Energy conservation;DME and/or AE instruction;Therapeutic activities;Patient/family education;Balance training      OT Goals(Current goals can be found in the care plan section)   Acute Rehab OT Goals OT Goal Formulation: With patient Time For Goal Achievement: 05/19/24  Potential to Achieve Goals: Good   OT Frequency:  Min 2X/week       AM-PAC OT 6 Clicks Daily Activity     Outcome Measure Help from another person eating meals?: None Help from another person taking care of  personal grooming?: None Help from another person toileting, which includes using toliet, bedpan, or urinal?: A Little Help from another person bathing (including washing, rinsing, drying)?: A Little Help from another person to put on and taking off regular upper body clothing?: None Help from another person to put on and taking off regular lower body clothing?: A Little 6 Click Score: 21   End of Session Equipment Utilized During Treatment: Rolling walker (2 wheels);Oxygen Nurse Communication: Mobility status  Activity Tolerance: Patient limited by fatigue Patient left: in bed;with call bell/phone within reach;with bed alarm set (in chair position)  OT Visit Diagnosis: Muscle weakness (generalized) (M62.81);Unsteadiness on feet (R26.81);Other abnormalities of gait and mobility (R26.89)                Time: 8794-8775 OT Time Calculation (min): 19 min Charges:  OT General Charges $OT Visit: 1 Visit OT Evaluation $OT Eval Low Complexity: 1 Low Aleaha Fickling L. Chenel Wernli, OTR/L  05/05/24, 1:01 PM

## 2024-05-05 NOTE — Progress Notes (Signed)
 Physical Therapy Treatment Patient Details Name: Dean Atiyeh Sr. MRN: [id number] DOB: 1951-09-25 Today's Date: 05/05/2024   History of Present Illness Pt is a 73 y.o. male admitted with acute on chronic systolic/diastolic heart failure, acute hypoxic respiratory distress, severe sepsis due to acute cystitis, AKI and mild hyperkalemia. PMH significant for chronic systolic/diastolic heart failure, status post ICD placement in January 2018, paroxysmal AFIB on Eliquis , HTN, CAD, HLD, obstructive sleep apnea noncompliant with CPAP, Type II DM, CKD 3B, & laminectomy    PT Comments  Pt admitted with above diagnosis. Pt progressed distance but still gets DOE 3/4 toward end of walk. O2 sats >90% with ambulation but once back in bed desaturated to 87% on RA therefore replaced O2 at 2L. Pt needs continued therapy.   Pt currently with functional limitations due to the deficits listed below (see PT Problem List). Pt will benefit from acute skilled PT to increase their independence and safety with mobility to allow discharge.       If plan is discharge home, recommend the following: A little help with walking and/or transfers;A little help with bathing/dressing/bathroom;Assistance with cooking/housework;Help with stairs or ramp for entrance   Can travel by private vehicle        Equipment Recommendations  None recommended by PT    Recommendations for Other Services       Precautions / Restrictions Precautions Precautions: Fall Recall of Precautions/Restrictions: Intact Restrictions Weight Bearing Restrictions Per Provider Order: No     Mobility  Bed Mobility               General bed mobility comments: Pt in chair on arrival    Transfers Overall transfer level: Needs assistance Equipment used: Rolling walker (2 wheels) Transfers: Sit to/from Stand Sit to Stand: Contact guard assist, Supervision           General transfer comment: CGA progressing to supervision for STS  transfers, cues for hand placement    Ambulation/Gait Ambulation/Gait assistance: Contact guard assist Gait Distance (Feet): 185 Feet Assistive device: Rolling walker (2 wheels) Gait Pattern/deviations: Step-through pattern, Decreased stride length, Drifts right/left Gait velocity: dec Gait velocity interpretation: <1.8 ft/sec, indicate of risk for recurrent falls   General Gait Details: Walked into hallway with RW; Occasionally letting RW too far in front of body;  cues for RW proximity and to self-monitor for activity tolerance.  Incr distance today.  Pt did not desaturate below 90% during ambulation on RW however desaturate once back in bed to 87% on RA therefore replaced O2 at 2L.   Stairs             Wheelchair Mobility     Tilt Bed    Modified Rankin (Stroke Patients Only)       Balance Overall balance assessment: Needs assistance Sitting-balance support: No upper extremity supported, Feet supported Sitting balance-Leahy Scale: Good     Standing balance support: Bilateral upper extremity supported, During functional activity Standing balance-Leahy Scale: Fair Standing balance comment: increased work of breathing with standing attempts and with ambulation                            Communication Communication Communication: No apparent difficulties  Cognition Arousal: Alert Behavior During Therapy: WFL for tasks assessed/performed, Flat affect   PT - Cognitive impairments: No apparent impairments  Following commands: Intact      Cueing Cueing Techniques: Verbal cues  Exercises General Exercises - Lower Extremity Long Arc Quad: AROM, Both, 10 reps, Seated Hip Flexion/Marching: 15 reps, Both, AAROM, Standing    General Comments General comments (skin integrity, edema, etc.): 94% on 2L on arrival. Desaturate to 90% wiht ambulation however once back in bed desaturate to 87% on RA therefore replaced O2 at 2L and  sats 97%.      Pertinent Vitals/Pain Pain Assessment Pain Assessment: No/denies pain    Home Living Family/patient expects to be discharged to:: Private residence Living Arrangements: Children Available Help at Discharge: Family;Available 24 hours/day Type of Home: House Home Access: Level entry     Alternate Level Stairs-Number of Steps: 13 Home Layout: Two level;Bed/bath upstairs Home Equipment: Grab bars - tub/shower;Grab bars - toilet;Cane - single point;Shower Scientist, physiological (2 wheels)      Prior Function            PT Goals (current goals can now be found in the care plan section) Acute Rehab PT Goals Patient Stated Goal: Get well, go home, work Progress towards PT goals: Progressing toward goals    Frequency    Min 2X/week      PT Plan      Co-evaluation              AM-PAC PT 6 Clicks Mobility   Outcome Measure  Help needed turning from your back to your side while in a flat bed without using bedrails?: None Help needed moving from lying on your back to sitting on the side of a flat bed without using bedrails?: A Little Help needed moving to and from a bed to a chair (including a wheelchair)?: A Little Help needed standing up from a chair using your arms (e.g., wheelchair or bedside chair)?: A Little Help needed to walk in hospital room?: A Little Help needed climbing 3-5 steps with a railing? : A Little 6 Click Score: 19    End of Session Equipment Utilized During Treatment: Gait belt;Oxygen Activity Tolerance: Patient tolerated treatment well;Patient limited by fatigue Patient left: with call bell/phone within reach;in bed;with bed alarm set Nurse Communication: Mobility status PT Visit Diagnosis: Unsteadiness on feet (R26.81);Other abnormalities of gait and mobility (R26.89);Muscle weakness (generalized) (M62.81);Difficulty in walking, not elsewhere classified (R26.2)     Time: 8881-8859 PT Time Calculation (min) (ACUTE  ONLY): 22 min  Charges:    $Gait Training: 8-22 mins PT General Charges $$ ACUTE PT VISIT: 1 Visit                     Gabrien Mentink M,PT Acute Rehab Services [phone]    Dean [name] 05/05/2024, 1:47 PM

## 2024-05-06 ENCOUNTER — Encounter (HOSPITAL_COMMUNITY)
Admission: EM | Disposition: A | Discharge status: Home / Self Care | Payer: Self-pay | Source: Home / Self Care | Attending: Internal Medicine

## 2024-05-06 ENCOUNTER — Other Ambulatory Visit: Payer: Self-pay

## 2024-05-06 ENCOUNTER — Encounter (HOSPITAL_COMMUNITY): Payer: Self-pay | Admitting: Internal Medicine

## 2024-05-06 ENCOUNTER — Inpatient Hospital Stay (HOSPITAL_COMMUNITY)

## 2024-05-06 DIAGNOSIS — I480 Paroxysmal atrial fibrillation: Secondary | ICD-10-CM | POA: Diagnosis not present

## 2024-05-06 DIAGNOSIS — I10 Essential (primary) hypertension: Secondary | ICD-10-CM | POA: Diagnosis not present

## 2024-05-06 DIAGNOSIS — R570 Cardiogenic shock: Secondary | ICD-10-CM

## 2024-05-06 DIAGNOSIS — I2510 Atherosclerotic heart disease of native coronary artery without angina pectoris: Secondary | ICD-10-CM | POA: Diagnosis not present

## 2024-05-06 DIAGNOSIS — I5023 Acute on chronic systolic (congestive) heart failure: Secondary | ICD-10-CM | POA: Diagnosis not present

## 2024-05-06 HISTORY — PX: CENTRAL LINE INSERTION: CATH118232

## 2024-05-06 HISTORY — PX: RIGHT HEART CATH: CATH118263

## 2024-05-06 LAB — COOXEMETRY PANEL
Carboxyhemoglobin: 1.9 % — ABNORMAL HIGH (ref 0.5–1.5)
Methemoglobin: 0.7 % (ref 0.0–1.5)
O2 Saturation: 63.1 %
Total hemoglobin: 10.3 g/dL — ABNORMAL LOW (ref 12.0–16.0)

## 2024-05-06 LAB — GLUCOSE, CAPILLARY
Glucose-Capillary: 119 mg/dL — ABNORMAL HIGH (ref 70–99)
Glucose-Capillary: 111 mg/dL — ABNORMAL HIGH (ref 70–99)
Glucose-Capillary: 122 mg/dL — ABNORMAL HIGH (ref 70–99)

## 2024-05-06 LAB — POCT I-STAT EG7
Acid-Base Excess: 0 mmol/L (ref 0.0–2.0)
Acid-base deficit: 1 mmol/L (ref 0.0–2.0)
Bicarbonate: 26.2 mmol/L (ref 20.0–28.0)
Bicarbonate: 26.8 mmol/L (ref 20.0–28.0)
Calcium, Ion: 1.17 mmol/L (ref 1.15–1.40)
Calcium, Ion: 1.22 mmol/L (ref 1.15–1.40)
HCT: 35 % — ABNORMAL LOW (ref 39.0–52.0)
HCT: 36 % — ABNORMAL LOW (ref 39.0–52.0)
Hemoglobin: 11.9 g/dL — ABNORMAL LOW (ref 13.0–17.0)
Hemoglobin: 12.2 g/dL — ABNORMAL LOW (ref 13.0–17.0)
O2 Saturation: 52 %
O2 Saturation: 54 %
Potassium: 3.7 mmol/L (ref 3.5–5.1)
Potassium: 3.9 mmol/L (ref 3.5–5.1)
Sodium: 136 mmol/L (ref 135–145)
Sodium: 138 mmol/L (ref 135–145)
TCO2: 28 mmol/L (ref 22–32)
TCO2: 28 mmol/L (ref 22–32)
pCO2, Ven: 50.1 mmHg (ref 44–60)
pCO2, Ven: 51.3 mmHg (ref 44–60)
pH, Ven: 7.315 (ref 7.25–7.43)
pH, Ven: 7.336 (ref 7.25–7.43)
pO2, Ven: 31 mmHg — CL (ref 32–45)
pO2, Ven: 31 mmHg — CL (ref 32–45)

## 2024-05-06 LAB — HEPATIC FUNCTION PANEL
ALT: 1188 U/L — ABNORMAL HIGH (ref 0–44)
AST: 696 U/L — ABNORMAL HIGH (ref 15–41)
Albumin: 2.8 g/dL — ABNORMAL LOW (ref 3.5–5.0)
Alkaline Phosphatase: 81 U/L (ref 38–126)
Bilirubin, Direct: 1.3 mg/dL — ABNORMAL HIGH (ref 0.0–0.2)
Indirect Bilirubin: 1 mg/dL — ABNORMAL HIGH (ref 0.3–0.9)
Total Bilirubin: 2.3 mg/dL — ABNORMAL HIGH (ref 0.0–1.2)
Total Protein: 5.8 g/dL — ABNORMAL LOW (ref 6.5–8.1)

## 2024-05-06 LAB — BASIC METABOLIC PANEL WITH GFR
Anion gap: 12 (ref 5–15)
BUN: 66 mg/dL — ABNORMAL HIGH (ref 8–23)
CO2: 21 mmol/L — ABNORMAL LOW (ref 22–32)
Calcium: 8.8 mg/dL — ABNORMAL LOW (ref 8.9–10.3)
Chloride: 100 mmol/L (ref 98–111)
Creatinine, Ser: 2.83 mg/dL — ABNORMAL HIGH (ref 0.61–1.24)
GFR, Estimated: 23 mL/min — ABNORMAL LOW (ref >60)
Glucose, Bld: 114 mg/dL — ABNORMAL HIGH (ref 70–99)
Potassium: 4.6 mmol/L (ref 3.5–5.1)
Sodium: 133 mmol/L — ABNORMAL LOW (ref 135–145)

## 2024-05-06 LAB — MAGNESIUM: Magnesium: 2 mg/dL (ref 1.7–2.4)

## 2024-05-06 LAB — LACTIC ACID, PLASMA: Lactic Acid, Venous: 2.5 mmol/L (ref 0.5–1.9)

## 2024-05-06 SURGERY — RIGHT HEART CATH
Anesthesia: LOCAL

## 2024-05-06 MED ORDER — FUROSEMIDE 10 MG/ML IJ SOLN
120.0000 mg | Freq: Two times a day (BID) | INTRAVENOUS | Status: DC
  Administered 2024-05-06 (×2): 120 mg via INTRAVENOUS
  Filled 2024-05-06: qty 12
  Filled 2024-05-06: qty 120
  Filled 2024-05-06: qty 12

## 2024-05-06 MED ORDER — LIDOCAINE HCL (PF) 1 % IJ SOLN
INTRAMUSCULAR | Status: AC
  Filled 2024-05-06: qty 30

## 2024-05-06 MED ORDER — HEPARIN (PORCINE) 25000 UT/250ML-% IV SOLN
1100.0000 [IU]/h | INTRAVENOUS | Status: DC
  Administered 2024-05-06 (×2): 1200 [IU]/h via INTRAVENOUS
  Filled 2024-05-06: qty 250

## 2024-05-06 MED ORDER — HEPARIN (PORCINE) IN NACL 1000-0.9 UT/500ML-% IV SOLN
INTRAVENOUS | Status: DC | PRN
  Administered 2024-05-06 (×2): 500 mL

## 2024-05-06 MED ORDER — LIDOCAINE HCL (PF) 1 % IJ SOLN
INTRAMUSCULAR | Status: DC | PRN
  Administered 2024-05-06 (×2): 5 mL

## 2024-05-06 MED ORDER — GUAIFENESIN-DM 100-10 MG/5ML PO SYRP
5.0000 mL | ORAL_SOLUTION | ORAL | Status: DC | PRN
  Administered 2024-05-06 – 2024-05-10 (×8): 5 mL via ORAL
  Filled 2024-05-06 (×6): qty 5

## 2024-05-06 MED ORDER — MILRINONE LACTATE IN DEXTROSE 20-5 MG/100ML-% IV SOLN
0.1250 ug/kg/min | INTRAVENOUS | Status: DC
  Administered 2024-05-06 – 2024-05-08 (×7): 0.25 ug/kg/min via INTRAVENOUS
  Filled 2024-05-06 (×4): qty 100

## 2024-05-06 MED ORDER — CHLORHEXIDINE GLUCONATE CLOTH 2 % EX PADS
6.0000 | MEDICATED_PAD | Freq: Every day | CUTANEOUS | Status: DC
  Administered 2024-05-06 – 2024-05-10 (×7): 6 via TOPICAL

## 2024-05-06 SURGICAL SUPPLY — 8 items
CATH SWAN GANZ 7F STRAIGHT (CATHETERS) IMPLANT
ELECT DEFIB PAD ADLT CADENCE (PAD) IMPLANT
GLIDESHEATH SLENDER 7FR .021G (SHEATH) IMPLANT
PACK CARDIAC CATHETERIZATION (CUSTOM PROCEDURE TRAY) ×3 IMPLANT
SHEATH PINNACLE 7F 10CM (SHEATH) IMPLANT
SHEATH PROBE COVER 6X72 (BAG) IMPLANT
TRANSDUCER W/STOPCOCK (MISCELLANEOUS) IMPLANT
TUBING ART PRESS 72 MALE/FEM (TUBING) IMPLANT

## 2024-05-06 NOTE — Progress Notes (Signed)
 IVT consulted r/t pt's central access. Line documented at [facility] line, upon assessment line noted to be a TL CVC in the internal jugular.  Due to previous documentation, will have CXR to verify placement.

## 2024-05-06 NOTE — H&P (View-Only) (Signed)
 Advanced Heart Failure Team Consult Note   Primary Physician: [name], MD Cardiologist:  Vinie JAYSON Maxcy, MD  Reason for Consultation: Acute on chronic systolic CHF  HPI:    Dean Courville Sr. is seen today for evaluation of acute on chronic systolic CHF at the request of Dr. Noralee with [facility]. 73 y.o. male with history of chronic systolic CHF, LBBB, CAD, hx VT/VF arrest s/p ICD, PAF, OSA, DM II, HTN, MR. His ejection fraction has been in 30-35% range in recent years. He is followed by Dr. Maxcy in Cardiology clinic.  Cardiac history dates back to 2017 when he suffered VT/VF arrest requiring CPR/defibrillation. Cardiac cath demonstrated 90% ostial RCA and 100% m to d RCA with collaterals. CAD treated medically. Echo with LVEF 35-40% and AK of basal inferior and apical myocardium. Later underwent single chamber medtronic ICD in 09/2016. Since then EF has been in range of 30-35%.   Admitted in 12/23 d/t ICD shock for VT/VF in setting of amiodarone  noncompliance.  He was admitted in 2/25 with GI bleed requiring transfusions after polypectomy by hot snare with clips.  He was admitted 07/27-07/29/25 with acute on chronic CHF. He was diuresed with IV lasix .   Presented to the ED on 05/02/24 with shortness of breath and dysuria (recent treatment for UTI). Given IV lasix  and treated for possible UTI. Discharged home but returned the next day with worsening weakness, dyspnea and dysuria. He was admitted with respiratory distress, AKI on CKD IIIb and concern for severe sepsis d/t acute cystitis and acute on chronic CHF. Labs significant for lactic acidosis (lactic acid up to 6.6), K 6.6, AST 3,966, ALT 2,269, T bili 3.8, Scr 2.87. HS troponin mildly elevated with flat trend. He was started on abx and diuresis attempted with IV lasix . Lactic acidosis and LFTs began to improve but he's had poor response to IV diuretics.   Reports feeling okay. Dyspnea has improved. He is complaint with home  medications. States he is independent in ADLs and reportedly still works in [employer].  No ETOH or tobacco use.   Home Medications Prior to Admission medications   Medication Sig Start Date End Date Taking? Authorizing Provider  allopurinol  (ZYLOPRIM ) 300 MG tablet Take 300 mg by mouth daily. 06/13/23  Yes [provider]  amiodarone  (PACERONE ) 200 MG tablet Take 1 tablet (200 mg total) by mouth daily. 09/06/22  Yes Leverne Charlies Helling, PA-C  apixaban  (ELIQUIS ) 5 MG TABS tablet TAKE 1 TABLET BY MOUTH TWICE A DAY 05/01/24  Yes Camnitz, Will Gladis, MD  atorvastatin  (LIPITOR) 40 MG tablet Take 40 mg by mouth daily.   Yes [provider]  carvedilol  (COREG ) 12.5 MG tablet TAKE 1 TABLET (12.5MG  TOTAL) BY MOUTH TWICE A DAY WITH MEALS 08/28/23  Yes Leverne Charlies Helling, PA-C  cholecalciferol  (VITAMIN D3) 25 MCG (1000 UNIT) tablet Take 1,000 Units by mouth daily. 06/13/23  Yes [provider]  doxycycline  (VIBRAMYCIN ) 100 MG capsule Take 100 mg by mouth 2 (two) times daily. 04/29/24  Yes [provider]  ferrous sulfate  325 (65 FE) MG tablet Take 325 mg by mouth daily. 11/08/23  Yes [provider]  furosemide  (LASIX ) 20 MG tablet TAKE 1 TABLET BY MOUTH EVERY DAY 05/01/24  Yes Hilty, Vinie JAYSON, MD  guaiFENesin -codeine  100-10 MG/5ML syrup Take 5 mLs by mouth at bedtime as needed for cough. 05/02/24  Yes Yolande Lamar JAYSON, MD  valsartan  (DIOVAN ) 40 MG tablet Take 1 tablet (40 mg total) by mouth  daily. 11/27/23  Yes Vicci Rollo SAUNDERS, PA-C  furosemide  (LASIX ) 40 MG tablet Take 1 tablet (40 mg total) by mouth daily. Patient not taking: Reported on 05/04/2024 05/02/24   Yolande Lamar BROCKS, MD    Past Medical History: Past Medical History:  Diagnosis Date   AICD (automatic cardioverter/defibrillator) present 10/10/2016   a. 09/2016 s/p MDT Visia AF MRI single lead ICD (ser # [id number]).   Arthritis    maybe in his spine (09/05/2016)   Asthma    pt. denies   Cardiac  arrest (HCC)    a. 08/2016 VT/VF Arrest-->09/2016 s/p MDT Visia AF MRI single lead ICD (ser # [id number]).   Chronic HFrEF (heart failure with reduced ejection fraction) (HCC)    a. 08/2016 Echo: EF 35-40%; b. 04/2018 Echo: EF 30-35%; c. 04/2020 Echo: EF 30-35%; d. 11/2021 Echo: EF 40-45%, GrI DD, nl RV fxn, sev dil LA, mild-mod MR, mild-mod AI, Ao root 38mm.   Coronary artery disease    a. 08/2016 VT Arrest/Cath: LM nl, LAD nl, LCX nl, RGCA 90p, 100d w/ L->R collats, EF 35-45%.   Gout    Hyperlipidemia LDL goal <70    Hypertension    Ischemic cardiomyopathy    a. 08/2016 Echo: EF 35-40%; b. 04/2018 Echo: EF 30-35%; c. 04/2020 Echo: EF 30-35%; d. 11/2021 Echo: EF 40-45%.   LBBB (left bundle branch block)    OSA on CPAP    does not wear CPAP   PAF (paroxysmal atrial fibrillation) (HCC)    a. CHA2DS2VASc = 5-->Eliquis .   Type II diabetes mellitus (HCC)     Past Surgical History: Past Surgical History:  Procedure Laterality Date   CARDIAC CATHETERIZATION N/A 09/07/2016   Procedure: Left Heart Cath and Coronary Angiography;  Surgeon: Peter M Swaziland, MD;  Location: [facility] INVASIVE CV LAB;  Service: Cardiovascular;  Laterality: N/A;   COLONOSCOPY WITH PROPOFOL  N/A 08/30/2018   Procedure: COLONOSCOPY WITH PROPOFOL ;  Surgeon: Rollin Dover, MD;  Location: [facility] ENDOSCOPY;  Service: Endoscopy;  Laterality: N/A;   COLONOSCOPY WITH PROPOFOL  N/A 11/16/2023   Procedure: COLONOSCOPY WITH PROPOFOL ;  Surgeon: Rollin Dover, MD;  Location: [facility]COPY;  Service: Gastroenterology;  Laterality: N/A;   COLONOSCOPY WITH PROPOFOL  N/A 11/18/2023   Procedure: COLONOSCOPY WITH PROPOFOL ;  Surgeon: Charlanne Groom, MD;  Location: [facility];  Service: Gastroenterology;  Laterality: N/A;   EP IMPLANTABLE DEVICE N/A 10/10/2016   Procedure: ICD Implant;  Surgeon: Will Gladis Norton, MD;  Location: [facility] INVASIVE CV LAB;  Service: Cardiovascular;  Laterality: N/A;   HEMOSTASIS CLIP PLACEMENT  11/16/2023   Procedure: HEMOSTASIS CLIP  PLACEMENT;  Surgeon: Rollin Dover, MD;  Location: [facility];  Service: Gastroenterology;;   HEMOSTASIS CLIP PLACEMENT  11/18/2023   Procedure: HEMOSTASIS CLIP PLACEMENT;  Surgeon: Charlanne Groom, MD;  Location: [facility];  Service: Gastroenterology;;   HEMOSTASIS CONTROL  11/18/2023   Procedure: HEMOSTASIS CONTROL;  Surgeon: Charlanne Groom, MD;  Location: [facility];  Service: Gastroenterology;;  PuraStat   LUMBAR LAMINECTOMY/DECOMPRESSION MICRODISCECTOMY N/A 01/18/2018   Procedure: L2-3, L3-4, L-4-5 CENTRAL DECOMPRESSION;  Surgeon: Barbarann Oneil BROCKS, MD;  Location: [facility] OR;  Service: Orthopedics;  Laterality: N/A;   LUMBAR LAMINECTOMY/DECOMPRESSION MICRODISCECTOMY N/A 08/12/2020   Procedure: THORACIC ELEVEN LAMINECTOMY;  Surgeon: Louis Shove, MD;  Location: [facility] OR;  Service: Neurosurgery;  Laterality: N/A;   POLYPECTOMY  08/30/2018   Procedure: POLYPECTOMY;  Surgeon: Rollin Dover, MD;  Location: [facility];  Service: Endoscopy;;   POLYPECTOMY  11/16/2023   Procedure: POLYPECTOMY;  Surgeon: Rollin,  Belvie, MD;  Location: [facility];  Service: Gastroenterology;;    Family History: Family History  Problem Relation Age of Onset   Heart attack Father        died in his 54's   Hypertension Sister    Cancer Brother        uncertain, leg   Hypertension Sister     Social History: Social History   Socioeconomic History   Marital status: Widowed    Spouse name: Not on file   Number of children: Not on file   Years of education: Not on file   Highest education level: Not on file  Occupational History   Occupation: semi-retired Proofreader  Tobacco Use   Smoking status: Never   Smokeless tobacco: Never  Vaping Use   Vaping status: Never Used  Substance and Sexual Activity   Alcohol use: No   Drug use: No   Sexual activity: Not on file  Other Topics Concern   Not on file  Social History Narrative   Lives with daughters.    Social Drivers of Corporate investment banker Strain: Not  on file  Food Insecurity: No Food Insecurity (05/04/2024)   Hunger Vital Sign    Worried About Running Out of Food in the Last Year: Never true    Ran Out of Food in the Last Year: Never true  Transportation Needs: No Transportation Needs (05/04/2024)   PRAPARE - Administrator, Civil Service (Medical): No    Lack of Transportation (Non-Medical): No  Physical Activity: Not on file  Stress: Not on file  Social Connections: Moderately Integrated (05/04/2024)   Social Connection and Isolation Panel    Frequency of Communication with Friends and Family: More than three times a week    Frequency of Social Gatherings with Friends and Family: More than three times a week    Attends Religious Services: More than 4 times per year    Active Member of [employer] or Organizations: Yes    Attends Banker Meetings: 1 to 4 times per year    Marital Status: Widowed  Recent Concern: Social Connections - Moderately Isolated (04/20/2024)   Social Connection and Isolation Panel    Frequency of Communication with Friends and Family: More than three times a week    Frequency of Social Gatherings with Friends and Family: More than three times a week    Attends Religious Services: More than 4 times per year    Active Member of [employer] or Organizations: No    Attends Banker Meetings: Never    Marital Status: Widowed    Allergies:  Allergies  Allergen Reactions   Contrast Media [Iodinated Contrast Media] Shortness Of Breath and Nausea And Vomiting   Lisinopril  Cough    Objective:    Vital Signs:   Temp:  [97.7 F (36.5 C)-98.6 F (37 C)] 97.9 F (36.6 C) (08/12 0747) Pulse Rate:  [55] 55 (08/12 0747) Resp:  [16-20] 16 (08/12 0747) BP: (95-111)/(40-51) 111/51 (08/12 0747) SpO2:  [100 %] 100 % (08/12 0747) Weight:  [89.8 kg] 89.8 kg (08/12 0434) Last BM Date : 05/03/24  Weight change: Filed Weights   05/03/24 2131 05/05/24 0400 05/06/24 0434  Weight: 88.5 kg 89.9  kg 89.8 kg    Intake/Output:   Intake/Output Summary (Last 24 hours) at 05/06/2024 1050 Last data filed at 05/06/2024 0900 Gross per 24 hour  Intake 0 ml  Output 550 ml  Net -550 ml  Physical Exam    General:  Fatigued appearing elderly male. Stting up in chair. No distress. Cor: JVP ~ 10 cm. Regular rate & rhythm. 3/6 MR murmur Lungs: diminished Abdomen: soft, nontender, nondistended. Extremities: 2-3+ edema Neuro: alert & orientedx3. Affect pleasant   Telemetry   Sinus brady 50s   Labs   Basic Metabolic Panel: Recent Labs  Lab 05/02/24 0545 05/03/24 2146 05/04/24 0000 05/04/24 0333 05/04/24 0508 05/05/24 0447 05/06/24 0548  NA 138 137  --  139 137 134* 133*  K 4.5 5.4*  --  6.6* 5.1 4.2 4.6  CL 106 100  --  102 103 100 100  CO2 21* 19*  --  21*  --  24 21*  GLUCOSE 101* 76  --  65* 77 133* 114*  BUN 25* 41*  --  45* 45* 55* 66*  CREATININE 1.72* 2.72*  --  2.87* 2.80* 2.77* 2.83*  CALCIUM  9.7 10.1  --  9.8  --  8.8* 8.8*  MG  --   --  2.2 2.1  --  2.1 2.0  PHOS  --   --   --  5.1*  --   --   --     Liver Function Tests: Recent Labs  Lab 05/02/24 0545 05/04/24 0333 05/05/24 0447  AST 23 3,966* 1,680*  ALT 23 2,269* 1,708*  ALKPHOS 65 89 82  BILITOT 1.2 3.8* 2.7*  PROT 6.7 7.1 6.0*  ALBUMIN  3.3* 3.6 3.0*   No results for input(s): LIPASE, AMYLASE in the last 168 hours. No results for input(s): AMMONIA in the last 168 hours.  CBC: Recent Labs  Lab 05/02/24 0545 05/03/24 2146 05/04/24 0113 05/04/24 0508 05/05/24 0447  WBC 8.6 12.4* 12.3*  --  10.2  NEUTROABS 6.2  --  10.2*  --   --   HGB 11.1* 12.0* 10.9* 12.2* 10.8*  HCT 36.1* 39.7 36.3* 36.0* 34.2*  MCV 93.8 94.3 95.5  --  91.4  PLT 149* 121* 60*  --  52*    Cardiac Enzymes: No results for input(s): CKTOTAL, CKMB, CKMBINDEX, TROPONINI in the last 168 hours.  BNP: BNP (last 3 results) Recent Labs    04/20/24 1052 05/03/24 2146 05/04/24 0333  BNP 2,103.7*  2,633.5* 2,770.3*    ProBNP (last 3 results) No results for input(s): PROBNP in the last 8760 hours.   CBG: Recent Labs  Lab 05/05/24 0841 05/05/24 1239 05/05/24 1628 05/05/24 2111 05/06/24 0746  GLUCAP 106* 146* 157* 112* 119*    Coagulation Studies: No results for input(s): LABPROT, INR in the last 72 hours.   Imaging   US  EKG SITE RITE Result Date: 05/06/2024 If Site Rite image not attached, placement could not be confirmed due to current cardiac rhythm.    Medications:     Current Medications:  amiodarone   200 mg Oral Daily   apixaban   5 mg Oral BID   atorvastatin   40 mg Oral Daily   cephALEXin   500 mg Oral Q12H   ferrous sulfate   325 mg Oral Daily   furosemide   80 mg Intravenous BID   insulin  aspart  0-6 Units Subcutaneous TID WC    Infusions:     Patient Profile   73 y.o. male with history of chronic systolic CHF, CAD, VT/VF arrest s/p single chamber ICD, LBBB, PAF, MR, CKD IIIb, OSA on CPAP, DM II, HTN.   Admitted with acute on chronic systolic CHF>>cardiogenic shock.  Assessment/Plan   Acute on chronic systolic CHF>>>cardiogenic shock -Lactic acid  initially > 6 w/ evidence of end organ dysfunction with elevated LFTs and AKI. One blood cuutlure positive for staph epidermidis, likely contaminant. Urine culture also contaminated. Treated for UTI, but suspect acute illness primarily d/t cardiogenic shock. -CM likely at least in part ischemic. Had single vessel RCA disease in 2017 treated medically. Also has LBBB with wide QRS ( 150 ms) and significant MR. -Echo 04/25: EF 30-35%, grade II Ddm, RV okay, RVSP 70 mmHg, severe ischemic MR, moderate to severe TR -Repeat echo -NYHA III. Suspect he is downplaying symptoms. -Poor response to IV diuretics. Will likely require inotrope support. Will plan for RHC today to definitively assess hemodynamics. If CO markedly reduced may need to transfer to ICU. Plan to leave central line post cath. -No BP room  for GDMT  Informed Consent   Shared Decision Making/Informed Consent The risks, including but not limited to, [bleeding or vascular complications (1 in 500), pneumothorax (1 in 1600), arrhythmia (1 in 1000) and death (1 in 5000)], benefits (diagnostic support and/or management of heart failure, pulmonary hypertension) and alternatives of a right heart catheterization were discussed in detail with Mr. Hallum and he is willing to proceed.      2. MR/TR -Severe ischemic MR/moderate to severe TR on last echo 04/25 -Repeat echo -RHC today as above  3. PAF -Previously diagnosed on device -SR this admission. No AF on device check 08/04. Switch eliquis  to heparin  gtt for now  4. Hx VT/VF -Has single chamber ICD -Continue amiodarone  200 mg daily -Elevated LFTs likely 2/2 cardiogenic shock  5. Elevated LFTs -AST peaked ~ 4,000, ALT peaked ~ 2200, T bili 3.8 -Now trending down -Likley shock liver  6. AKI on CKD IIIb -Suspect ATN in setting of shock -Will likely need inotrope support to improve CO. -Scr 2.8 today  7. Possible UTI - Abx per primary team - Hold off on addition of SGLT2i this admit  7. CAD -Single vessel RCA disease on cath in 2017. Managed medically -Not on aspirin  with need for anticoagulation. Will hold statin with elevated LFTs   Length of Stay: 2  FINCH, LINDSAY N, PA-C  05/06/2024, 10:50 AM    Advanced Heart Failure Team Pager [phone] (M-F; 7a - 5p)  Please contact [facility] for night-coverage after hours (4p -7a ) and weekends on [email or web address]   Patient seen and examined with the above-signed Advanced Practice Provider and/or Housestaff. I personally reviewed laboratory data, imaging studies and relevant notes. I independently examined the patient and formulated the important aspects of the plan. I have edited the note to reflect any of my changes or salient points. I have personally discussed the plan with the patient and/or family.  73 y/o male as  above.  Admitted with shock initially felt to be septic in nature. Course c/b AKI and shock liver. Responded mildly to IVF and abx but has persistent lactic acidosis.   Denies CP or SOB currently. SBP 110   Last echo ef 30-35% with severe MR in 4/25   Has wide LBBB on ECG  General:  Sitting up in chair No resp difficulty HEENT: normal Neck: supple. JVP 10-12 Cor: Regular rate & rhythm. No rubs, gallops or murmurs. Lungs: clear Abdomen: soft, nontender, nondistended. No hepatosplenomegaly. No bruits or masses. Good bowel sounds. Extremities: no cyanosis, clubbing, rash, 2+ edema Neuro: alert & orientedx3, cranial nerves grossly intact. moves all 4 extremities w/o difficulty. Affect pleasant  I suspect he may have significant cardiac component to his shock. Will  plan RHC today for further assessment. Will plan repeat echo as well.   D/w his daughter by phone.   [name], MD  12:26 PM

## 2024-05-06 NOTE — Progress Notes (Signed)
 Heart Failure Navigator Progress Note  Assessed for Heart & Vascular TOC clinic readiness.  Patient does not meet criteria due to Advanced Heart Failure Team consulted per Dr. Arrien. No HF TOC. .   Navigator will sign off at this time.   Stephane Haddock, BSN, Scientist, clinical (histocompatibility and immunogenetics) Only

## 2024-05-06 NOTE — Plan of Care (Signed)
  Problem: Fluid Volume: Goal: Ability to maintain a balanced intake and output will improve Outcome: Progressing   Problem: Health Behavior/Discharge Planning: Goal: Ability to identify and utilize available resources and services will improve Outcome: Progressing   Problem: Coping: Goal: Ability to adjust to condition or change in health will improve Outcome: Progressing   Problem: Metabolic: Goal: Ability to maintain appropriate glucose levels will improve Outcome: Progressing   Problem: Clinical Measurements: Goal: Ability to maintain clinical measurements within normal limits will improve Outcome: Progressing Goal: Diagnostic test results will improve Outcome: Progressing Goal: Respiratory complications will improve Outcome: Progressing   Problem: Pain Managment: Goal: General experience of comfort will improve and/or be controlled Outcome: Progressing

## 2024-05-06 NOTE — Progress Notes (Signed)
 PT Cancellation Note  Patient Details Name: Dean Sippel Sr. MRN: [id number] DOB: 05/02/1951   Cancelled Treatment:    Reason Eval/Treat Not Completed: Patient at procedure or test/unavailable (Pt in heart CATh.  Will return at later date.)   Stephane JULIANNA Bevel 05/06/2024, 11:55 AM Orian Figueira M,PT Acute Rehab Services [phone]

## 2024-05-06 NOTE — Progress Notes (Signed)
 PHARMACY - ANTICOAGULATION CONSULT NOTE  Pharmacy Consult for heparin  Indication: atrial fibrillation  Allergies  Allergen Reactions   Contrast Media [Iodinated Contrast Media] Shortness Of Breath and Nausea And Vomiting   Lisinopril  Cough    Patient Measurements: Height: 5' 6 (167.6 cm) Weight: 89.8 kg (198 lb) IBW/kg (Calculated) : 63.8 HEPARIN  DW (KG): 82.4  Vital Signs: Temp: 97.9 F (36.6 C) (08/12 0747) Temp Source: Oral (08/12 0747) BP: 136/52 (08/12 1259) Pulse Rate: 66 (08/12 1259)  Labs: Recent Labs    05/03/24 2146 05/04/24 0000 05/04/24 0113 05/04/24 0333 05/04/24 0508 05/05/24 0447 05/06/24 0548  HGB 12.0*  --  10.9*  --  12.2* 10.8*  --   HCT 39.7  --  36.3*  --  36.0* 34.2*  --   PLT 121*  --  60*  --   --  52*  --   CREATININE 2.72*  --   --    < > 2.80* 2.77* 2.83*  TROPONINIHS 61* 53*  --   --   --   --   --    < > = values in this interval not displayed.    Estimated Creatinine Clearance: 24 mL/min (A) (by C-G formula based on SCr of 2.83 mg/dL (H)).   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 09/2016 s/p MDT Visia AF MRI single lead ICD (ser # [id number]).   Arthritis    maybe in his spine (09/05/2016)   Asthma    pt. denies   Cardiac arrest (HCC)    a. 08/2016 VT/VF Arrest-->09/2016 s/p MDT Visia AF MRI single lead ICD (ser # [id number]).   Chronic HFrEF (heart failure with reduced ejection fraction) (HCC)    a. 08/2016 Echo: EF 35-40%; b. 04/2018 Echo: EF 30-35%; c. 04/2020 Echo: EF 30-35%; d. 11/2021 Echo: EF 40-45%, GrI DD, nl RV fxn, sev dil LA, mild-mod MR, mild-mod AI, Ao root 38mm.   Coronary artery disease    a. 08/2016 VT Arrest/Cath: LM nl, LAD nl, LCX nl, RGCA 90p, 100d w/ L->R collats, EF 35-45%.   Gout    Hyperlipidemia LDL goal <70    Hypertension    Ischemic cardiomyopathy    a. 08/2016 Echo: EF 35-40%; b. 04/2018 Echo: EF 30-35%; c.  04/2020 Echo: EF 30-35%; d. 11/2021 Echo: EF 40-45%.   LBBB (left bundle branch block)    OSA on CPAP    does not wear CPAP   PAF (paroxysmal atrial fibrillation) (HCC)    a. CHA2DS2VASc = 5-->Eliquis .   Type II diabetes mellitus (HCC)      Assessment: 19 yoM admitted with CHF. Pt has hx AF on apixaban , pharmacy to transition to IV heparin .  Last apixaban  dose was 8/12 at 0923. Will dose via aPTT until correlating with heparin  levels.  Goal of Therapy:  Heparin  level 0.3-0.7 units/ml aPTT 66-102 seconds Monitor platelets by anticoagulation protocol: Yes   Plan:  -Heparin  1200 units/hr no bolus at 2130 tonight -Check 8-hr heparin  level -Monitor heparin  level, CBC, S/Sx bleeding daily  Ozell Jamaica, PharmD, BCPS, [facility] Clinical Pharmacist [phone] Please check AMION for all [facility] Pharmacy numbers 05/06/2024

## 2024-05-06 NOTE — Progress Notes (Signed)
 Occupational Therapy Treatment Patient Details Name: Dean Siddoway Sr. MRN: [id number] DOB: 08-23-1951 Today's Date: 05/06/2024   History of present illness Pt is a 73 y.o. male admitted with acute on chronic systolic/diastolic heart failure, acute hypoxic respiratory distress, severe sepsis due to acute cystitis, AKI and mild hyperkalemia. PMH significant for chronic systolic/diastolic heart failure, status post ICD placement in January 2018, paroxysmal AFIB on Eliquis , HTN, CAD, HLD, obstructive sleep apnea noncompliant with CPAP, Type II DM, CKD 3B, & laminectomy   OT comments  Pt seen for OT tx this date. Pt fatigues with activity and with overall flat affect but is participatory. Requires increased time and supervision for bed mobility, CGA - supervision for functional STS transfers and for standing oral care sink level. Pt walks from sink around bed to sit upright in recliner. Vital signs assessed throughout (see flowsheet), pt reporting SOB is improving overall. Pt making good progress towards goals, anticipate pt will continue to benefit from [facility] OT upon hospital discharge. OT will follow acutely.       If plan is discharge home, recommend the following:  A little help with walking and/or transfers;A little help with bathing/dressing/bathroom;Assist for transportation;Help with stairs or ramp for entrance;Assistance with cooking/housework   Equipment Recommendations  None recommended by OT       Precautions / Restrictions Precautions Precautions: Fall Recall of Precautions/Restrictions: Intact Restrictions Weight Bearing Restrictions Per Provider Order: No       Mobility Bed Mobility Overal bed mobility: Needs Assistance Bed Mobility: Supine to Sit     Supine to sit: Supervision          Transfers Overall transfer level: Needs assistance Equipment used: Rolling walker (2 wheels) Transfers: Sit to/from Stand Sit to Stand: Supervision, Contact guard assist            General transfer comment: CGA progressing to supervision for STS transfers, cues for hand placement     Balance Overall balance assessment: Needs assistance Sitting-balance support: No upper extremity supported, Feet supported Sitting balance-Leahy Scale: Good     Standing balance support: Bilateral upper extremity supported, During functional activity Standing balance-Leahy Scale: Fair Standing balance comment: increased work of breathing with standing attempts and with ambulation                           ADL either performed or assessed with clinical judgement   ADL Overall ADL's : Needs assistance/impaired     Grooming: Standing;Oral care;Supervision/safety Grooming Details (indicate cue type and reason): sink level                             Functional mobility during ADLs: Contact guard assist;Rolling walker (2 wheels) General ADL Comments: Pt requests to perform oral care, encourged to sit up in recliner     Communication Communication Communication: No apparent difficulties   Cognition Arousal: Alert Behavior During Therapy: WFL for tasks assessed/performed, Flat affect Cognition: No family/caregiver present to determine baseline                               Following commands: Intact        Cueing   Cueing Techniques: Verbal cues  Exercises      Shoulder Instructions       General Comments RN cleared for participation in session, vitals monitored throughout. Pt recieved with [place]  doffed on forehead, SpO2 on RA 96%. BP sitting EOB 104/45 (63), after 3 mins 115/58 (74) and end of session 122/40 (63). HR 60-70 bpm at rest, 90s with exertion. SpO2 end of session 95% on RA; donned 2L via [place] per pt request for comfort.    Pertinent Vitals/ Pain       Pain Assessment Pain Assessment: No/denies pain         Frequency  Min 2X/week        Progress Toward Goals  OT Goals(current goals can now be found in the care  plan section)  Progress towards OT goals: Progressing toward goals  Acute Rehab OT Goals OT Goal Formulation: With patient Time For Goal Achievement: 05/19/24 Potential to Achieve Goals: Good ADL Goals Pt Will Perform Grooming: with modified independence;standing;sitting Pt Will Perform Lower Body Dressing: with modified independence;sit to/from stand;sitting/lateral leans Pt Will Transfer to Toilet: with modified independence;ambulating;bedside commode Pt Will Perform Toileting - Clothing Manipulation and hygiene: with modified independence;sit to/from stand;sitting/lateral leans Additional ADL Goal #1: Pt will verbalize and implement 3 or more ECS strategies for continued independence in ADL/IADL performance  Plan         AM-PAC OT 6 Clicks Daily Activity     Outcome Measure   Help from another person eating meals?: None Help from another person taking care of personal grooming?: None Help from another person toileting, which includes using toliet, bedpan, or urinal?: A Little Help from another person bathing (including washing, rinsing, drying)?: A Little Help from another person to put on and taking off regular upper body clothing?: None Help from another person to put on and taking off regular lower body clothing?: A Little 6 Click Score: 21    End of Session Equipment Utilized During Treatment: Rolling walker (2 wheels);Gait belt;Oxygen  OT Visit Diagnosis: Muscle weakness (generalized) (M62.81);Unsteadiness on feet (R26.81);Other abnormalities of gait and mobility (R26.89)   Activity Tolerance Patient tolerated treatment well   Patient Left in chair;with call bell/phone within reach;with chair alarm set   Nurse Communication Mobility status;Other (comment) (vital signs)        Time: 9054-8984 OT Time Calculation (min): 30 min  Charges: OT General Charges $OT Visit: 1 Visit OT Treatments $Self Care/Home Management : 23-37 mins  Denika Krone L. Shataria Crist, OTR/L   05/06/24, 1:03 PM

## 2024-05-06 NOTE — Progress Notes (Addendum)
 PROGRESS NOTE    Dean Croft Sr.  FMW:[id number] DOB: 10-26-1950 DOA: 05/03/2024 PCP: [name], MD   73/M w systolic CHF,  PAF, hyperlipidemia, CKD 3b and T2DM who presented with dyspnea. 08/07 diagnosed with urinary tract infection and placed on antibiotic therapy.  In ED, VSS, ++ edema,  cr 2,72, BNP 2,633 , troponin 61 and 53  Urine analysis SG >1.030, protein > 300, large hgb and small leukocytes, > 50 wbc and 21-50 rbc  CXR w hypoinflation, left rotation, bilateral hilar vascular congestion, with fluid in the right fissure, positive pacemaker defibrillator in place with one right ventricle lead.  -8/11 responding slowly to diuresis  -8/12 poor response to diuretics, low blood pressure, lactic acidosis Possible low output heart failure, >consulted heart failure team. 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 LV posterior wall and basal inferior segment akinetic.  -Follow-up repeat echo - RHC with biventricular failure, elevated filling pressure, low cardiac output, moderate pulmonary hypertension, prominent V waves suggesting significant MR -Started on Lasix  gtt. and milrinone  yesterday, limited response thus far, may need higher dose milrinone , I worry severity of MR is contributing  Persistent cough - Likely secondary to above, also check respiratory virus panel, repeat x-ray  Congestive hepatopathy.  03/2024 live US  with  cholelithiasis, and benign hepatic hemangiomas.  Improving  Paroxysmal atrial fibrillation (HCC) Continue with amiodarone , apixaban .   CAD (coronary artery disease) - last ECHO in April w/ wall motion abnormality involving LV posterior wall and inferior basal segment - Single-vessel RCA disease noted on cath 2017, medical management - Statin on hold now  Chronic kidney disease, stage 3b (HCC) AKI, hyperkalemia. Hyponatremia.  -Worsening AKI, see discussion above  Type 2 diabetes mellitus  CBGs are stable  E. coli UTI Past urine culture positive for E coli sensitive for cephalosporins, sp 3 days of cephalexin  One bottle blood culture positive for staphylococcus epidermidis, suspected contaminant.  Repeat blood cultures negative thus far  Obesity, class 1 Calculated BMI is 31,4   DVT prophylaxis: IV heparin  Code Status: Full code Family Communication: none present Disposition Plan: may need rehab  Consultants:    Procedures:   Antimicrobials:    Objective: Vitals:   05/06/24 1244 05/06/24 1249 05/06/24 1254 05/06/24 1259  BP:  (!) 129/54 (!) 131/51 (!) 136/52  Pulse: 62 61 61 66  Resp: (!) 23 (!) 27 (!) 22 19  Temp:      TempSrc:      SpO2: 96% 96% 98% 96%  Weight:      Height:        Intake/Output Summary (Last 24 hours) at 05/06/2024 1450 Last data filed at 05/06/2024 0900 Gross per 24 hour  Intake 0 ml  Output 550 ml  Net -550 ml   Filed Weights   05/03/24 2131 05/05/24 0400 05/06/24 0434  Weight: 88.5 kg 89.9 kg 89.8 kg    Examination:  Gen: Awake, Alert, Oriented X 3,  HEENT: Positive JVD, right IJ central line Lungs: Poor air  movement, few basilar rhonchi and rails CVS: S1S2/RRR, systolic murmur Abd: soft, Non tender, non distended, BS present Extremities: 1+ edema Skin: no new rashes on exposed skin    Data Reviewed:   CBC: Recent Labs  Lab 05/02/24 0545 05/03/24 2146 05/04/24 0113 05/04/24 0508 05/05/24 0447  WBC 8.6 12.4* 12.3*   --  10.2  NEUTROABS 6.2  --  10.2*  --   --   HGB 11.1* 12.0* 10.9* 12.2* 10.8*  HCT 36.1* 39.7 36.3* 36.0* 34.2*  MCV 93.8 94.3 95.5  --  91.4  PLT 149* 121* 60*  --  52*   Basic Metabolic Panel: Recent Labs  Lab 05/02/24 0545 05/03/24 2146 05/04/24 0000 05/04/24 0333 05/04/24 0508 05/05/24 0447 05/06/24 0548  NA 138 137  --  139 137 134* 133*  K 4.5 5.4*  --  6.6* 5.1 4.2 4.6  CL 106 100  --  102 103 100 100  CO2 21* 19*  --  21*  --  24 21*  GLUCOSE 101* 76  --  65* 77 133* 114*  BUN 25* 41*  --  45* 45* 55* 66*  CREATININE 1.72* 2.72*  --  2.87* 2.80* 2.77* 2.83*  CALCIUM  9.7 10.1  --  9.8  --  8.8* 8.8*  MG  --   --  2.2 2.1  --  2.1 2.0  PHOS  --   --   --  5.1*  --   --   --    GFR: Estimated Creatinine Clearance: 24 mL/min (A) (by C-G formula based on SCr of 2.83 mg/dL (H)). Liver Function Tests: Recent Labs  Lab 05/02/24 0545 05/04/24 0333 05/05/24 0447 05/06/24 0929  AST 23 3,966* 1,680* 696*  ALT 23 2,269* 1,708* 1,188*  ALKPHOS 65 89 82 81  BILITOT 1.2 3.8* 2.7* 2.3*  PROT 6.7 7.1 6.0* 5.8*  ALBUMIN  3.3* 3.6 3.0* 2.8*   No results for input(s): LIPASE, AMYLASE in the last 168 hours. No results for input(s): AMMONIA in the last 168 hours. Coagulation Profile: No results for input(s): INR, PROTIME in the last 168 hours. Cardiac Enzymes: No results for input(s): CKTOTAL, CKMB, CKMBINDEX, TROPONINI in the last 168 hours. BNP (last 3 results) No results for input(s): PROBNP in the last 8760 hours. HbA1C: Recent Labs    05/04/24 0333  HGBA1C 5.4   CBG: Recent Labs  Lab 05/05/24 0841 05/05/24 1239 05/05/24 1628 05/05/24 2111 05/06/24 0746  GLUCAP 106* 146* 157* 112* 119*   Lipid Profile: No results for input(s): CHOL, HDL, LDLCALC, TRIG, CHOLHDL, LDLDIRECT in the last 72 hours. Thyroid Function Tests: No results for input(s): TSH, T4TOTAL, FREET4, T3FREE, THYROIDAB in the last 72 hours. Anemia  Panel: No results for input(s): VITAMINB12, FOLATE, FERRITIN, TIBC, IRON, RETICCTPCT in the last 72 hours. Urine analysis:    Component Value Date/Time   COLORURINE YELLOW 05/03/2024 2251   APPEARANCEUR HAZY (A) 05/03/2024 2251   LABSPEC >1.030 (H) 05/03/2024 2251   PHURINE 5.5 05/03/2024 2251   GLUCOSEU NEGATIVE 05/03/2024 2251   HGBUR LARGE (A) 05/03/2024 2251   BILIRUBINUR NEGATIVE 05/03/2024 2251   KETONESUR NEGATIVE 05/03/2024 2251   PROTEINUR >300 (A) 05/03/2024 2251   NITRITE NEGATIVE 05/03/2024 2251   LEUKOCYTESUR SMALL (A) 05/03/2024 2251   Sepsis Labs: @LABRCNTIP (procalcitonin:4,lacticidven:4)  ) Recent Results (from the past 240 hours)  Resp panel by RT-PCR (RSV, Flu A&B, Covid) Anterior Nasal Swab     Status: None   Collection Time: 05/02/24  6:23 AM  Specimen: Anterior Nasal Swab  Result Value Ref Range Status   SARS Coronavirus 2 by RT PCR NEGATIVE NEGATIVE Final   Influenza A by PCR NEGATIVE NEGATIVE Final   Influenza B by PCR NEGATIVE NEGATIVE Final    Comment: (NOTE) The Xpert Xpress SARS-CoV-2/FLU/RSV plus assay is intended as an aid in the diagnosis of influenza from Nasopharyngeal swab specimens and should not be used as a sole basis for treatment. Nasal washings and aspirates are unacceptable for Xpert Xpress SARS-CoV-2/FLU/RSV testing.  Fact Sheet for Patients: BloggerCourse.com  Fact Sheet for Healthcare Providers: SeriousBroker.it  This test is not yet approved or cleared by the [place]  FDA and has been authorized for detection and/or diagnosis of SARS-CoV-2 by FDA under an Emergency Use Authorization (EUA). This EUA will remain in effect (meaning this test can be used) for the duration of the COVID-19 declaration under Section 564(b)(1) of the Act, 21 U.S.C. section 360bbb-3(b)(1), unless the authorization is terminated or revoked.     Resp Syncytial Virus by PCR NEGATIVE  NEGATIVE Final    Comment: (NOTE) Fact Sheet for Patients: BloggerCourse.com  Fact Sheet for Healthcare Providers: SeriousBroker.it  This test is not yet approved or cleared by the [place]  FDA and has been authorized for detection and/or diagnosis of SARS-CoV-2 by FDA under an Emergency Use Authorization (EUA). This EUA will remain in effect (meaning this test can be used) for the duration of the COVID-19 declaration under Section 564(b)(1) of the Act, 21 U.S.C. section 360bbb-3(b)(1), unless the authorization is terminated or revoked.  Performed at [facility], [address], [place], [place] [address]   Urine Culture     Status: Abnormal   Collection Time: 05/02/24  8:10 AM   Specimen: Urine, Random  Result Value Ref Range Status   Specimen Description URINE, RANDOM  Final   Special Requests   Final    NONE Reflexed from [id number] Performed at [facility], [address], [place], [place] [address]    Culture MULTIPLE SPECIES PRESENT, SUGGEST RECOLLECTION (A)  Final   Report Status 05/03/2024 FINAL  Final  Culture, blood (Routine X 2) w Reflex to ID Panel     Status: None (Preliminary result)   Collection Time: 05/04/24  1:10 AM   Specimen: BLOOD  Result Value Ref Range Status   Specimen Description BLOOD LEFT ANTECUBITAL  Final   Special Requests   Final    BOTTLES DRAWN AEROBIC AND ANAEROBIC Blood Culture adequate volume   Culture   Final    NO GROWTH 2 DAYS Performed at [facility], [address], [place], [place] [address]    Report Status PENDING  Incomplete  Culture, blood (Routine X 2) w Reflex to ID Panel     Status: Abnormal (Preliminary result)   Collection Time: 05/04/24  3:32 AM   Specimen: BLOOD RIGHT HAND  Result Value Ref Range Status   Specimen Description BLOOD RIGHT HAND  Final   Special Requests   Final    BOTTLES DRAWN AEROBIC AND ANAEROBIC Blood Culture results may not be  optimal due to an inadequate volume of blood received in culture bottles   Culture  Setup Time   Final    GRAM POSITIVE COCCI IN CLUSTERS ANAEROBIC BOTTLE ONLY CRITICAL RESULT CALLED TO, READ BACK BY AND VERIFIED WITH: PHARMD JESSICA MILLEN ON 05/05/24 @ 1846 BY DRT    Culture (A)  Final    STAPHYLOCOCCUS EPIDERMIDIS THE SIGNIFICANCE OF ISOLATING THIS  ORGANISM FROM A SINGLE SET OF BLOOD CULTURES WHEN MULTIPLE SETS ARE DRAWN IS UNCERTAIN. PLEASE NOTIFY THE MICROBIOLOGY DEPARTMENT WITHIN ONE WEEK IF SPECIATION AND SENSITIVITIES ARE REQUIRED. Performed at [facility], [address], [place], [place] [address]    Report Status PENDING  Incomplete  Blood Culture ID Panel (Reflexed)     Status: Abnormal   Collection Time: 05/04/24  3:32 AM  Result Value Ref Range Status   Enterococcus faecalis NOT DETECTED NOT DETECTED Final   Enterococcus Faecium NOT DETECTED NOT DETECTED Final   Listeria monocytogenes NOT DETECTED NOT DETECTED Final   Staphylococcus species DETECTED (A) NOT DETECTED Final    Comment: CRITICAL RESULT CALLED TO, READ BACK BY AND VERIFIED WITH: PHARMD JESSICA MILLEN ON 05/05/24 @ 1846 BY DRT    Staphylococcus aureus (BCID) NOT DETECTED NOT DETECTED Final   Staphylococcus epidermidis DETECTED (A) NOT DETECTED Final    Comment: Methicillin (oxacillin) resistant coagulase negative staphylococcus. Possible blood culture contaminant (unless isolated from more than one blood culture draw or clinical case suggests pathogenicity). No antibiotic treatment is indicated for blood  culture contaminants. CRITICAL RESULT CALLED TO, READ BACK BY AND VERIFIED WITH: PHARMD JESSICA MILLEN ON 05/05/24 @ 1846 BY DRT    Staphylococcus lugdunensis NOT DETECTED NOT DETECTED Final   Streptococcus species NOT DETECTED NOT DETECTED Final   Streptococcus agalactiae NOT DETECTED NOT DETECTED Final   Streptococcus pneumoniae NOT DETECTED NOT DETECTED Final   Streptococcus pyogenes NOT DETECTED NOT  DETECTED Final   A.calcoaceticus-baumannii NOT DETECTED NOT DETECTED Final   Bacteroides fragilis NOT DETECTED NOT DETECTED Final   Enterobacterales NOT DETECTED NOT DETECTED Final   Enterobacter cloacae complex NOT DETECTED NOT DETECTED Final   Escherichia coli NOT DETECTED NOT DETECTED Final   Klebsiella aerogenes NOT DETECTED NOT DETECTED Final   Klebsiella oxytoca NOT DETECTED NOT DETECTED Final   Klebsiella pneumoniae NOT DETECTED NOT DETECTED Final   Proteus species NOT DETECTED NOT DETECTED Final   Salmonella species NOT DETECTED NOT DETECTED Final   Serratia marcescens NOT DETECTED NOT DETECTED Final   Haemophilus influenzae NOT DETECTED NOT DETECTED Final   Neisseria meningitidis NOT DETECTED NOT DETECTED Final   Pseudomonas aeruginosa NOT DETECTED NOT DETECTED Final   Stenotrophomonas maltophilia NOT DETECTED NOT DETECTED Final   Candida albicans NOT DETECTED NOT DETECTED Final   Candida auris NOT DETECTED NOT DETECTED Final   Candida glabrata NOT DETECTED NOT DETECTED Final   Candida krusei NOT DETECTED NOT DETECTED Final   Candida parapsilosis NOT DETECTED NOT DETECTED Final   Candida tropicalis NOT DETECTED NOT DETECTED Final   Cryptococcus neoformans/gattii NOT DETECTED NOT DETECTED Final   Methicillin resistance mecA/C DETECTED (A) NOT DETECTED Final    Comment: CRITICAL RESULT CALLED TO, READ BACK BY AND VERIFIED WITH: PHARMD JESSICA MILLEN ON 05/05/24 @ 1846 BY DRT Performed at [facility], [address] [place], [place] [address]      Radiology Studies: US  EKG SITE RITE Result Date: 05/06/2024 If Site Rite image not attached, placement could not be confirmed due to current cardiac rhythm.    Scheduled Meds:  [MAR Hold] amiodarone   200 mg Oral Daily   [MAR Hold] cephALEXin   500 mg Oral Q12H   [MAR Hold] ferrous sulfate   325 mg Oral Daily   [MAR Hold] insulin  aspart  0-6 Units Subcutaneous TID WC   Continuous Infusions:  furosemide      heparin       milrinone   LOS: 2 days    Time spent:    Sigurd Pac, MD [facility]   05/06/2024, 2:50 PM

## 2024-05-06 NOTE — Plan of Care (Signed)
   Problem: Coping: Goal: Ability to adjust to condition or change in health will improve Outcome: Progressing   Problem: Fluid Volume: Goal: Ability to maintain a balanced intake and output will improve Outcome: Progressing

## 2024-05-06 NOTE — Interval H&P Note (Signed)
 History and Physical Interval Note:  05/06/2024 12:27 PM  Dean Croft Sr.  has presented today for surgery, with the diagnosis of heart failure.  The various methods of treatment have been discussed with the patient and family. After consideration of risks, benefits and other options for treatment, the patient has consented to  Procedure(s): RIGHT HEART CATH (N/A) as a surgical intervention.  The patient's history has been reviewed, patient examined, no change in status, stable for surgery.  I have reviewed the patient's chart and labs.  Questions were answered to the patient's satisfaction.     Wai Litt

## 2024-05-06 NOTE — Progress Notes (Addendum)
 Progress Note   Patient: Dean Longton Sr. FMW:[id number] DOB: 12-14-1950 DOA: 05/03/2024     2 DOS: the patient was seen and examined on 05/06/2024   Brief hospital course: Mr. Kon was admitted to the hospital with the working diagnosis of heart failure decompensation.   73 yo male with the past medical history of heart failure, paroxysmal atrial fibrillation, hyperlipidemia, CKD 3b and T2DM who presented with dyspnea. Reported 2 days of progressive dyspnea along with lower extremity edema.  08/07 diagnosed with urinary tract infection and placed on antibiotic therapy.  On his initial physical examination his blood pressure was 143/85, HR 85, RR 28 and 02 saturation 100% Lungs with no wheezing or rhonchi, heart with S1 and S2 present and regular, abdomen with no distention and positive lower extremity edema.   Na 137 K 5,4 Cl 100 bicarbonate 19, glucose 76 bun 41 cr 2,72  P 2.2  BNP 2,633  High sensitive troponin 61 and 53  Wbc 12,4 hgb 12,0 plt 121   Urine analysis SG >1.030, protein > 300, large hgb and small leukocytes, > 50 wbc and 21-50 rbc   Chest radiograph with hypoinflation, left rotation, bilateral hilar vascular congestion, with fluid in the right fissure, positive pacemaker defibrillator in place with one right ventricle lead.   EKG 80 bpm, left axis deviation, qtc 528, left bundle branch block, sinus rhythm with 1st degree AV block, with no significant ST segment or T wave changes.   08/11 responding slowly to diuresis  08/12 patient not responding to diuresis as expected, low blood pressure and continue to have high lactate.  Possible low output heart failure, will consult heart failure team.   Assessment and Plan: * Acute on chronic systolic CHF (congestive heart failure) (HCC) 12/2023 echocardiogram with reduced LV systolic function 30 to 35%, moderate LV cavity dilatation, mild concentric LVH, RV systolic function preserved, RVSP 69.3 mmHg, LA with severe  dilatation, RA with mild dilatation, severe mitral regurgitation, with akinetic posterior wall (ischemic MR), moderate to severe tricuspid valve regurgitation, mild to moderate aortic valve regurgitation.  LV posterior wall and basal inferior segment akinetic.   Urine output documented 350 ml Systolic blood pressure 95 to 899 mmHg. Lactate continue elevated at 2,5   Patient continue volume overloaded, has not responded well to IV furosemide  high dose.  Signs of hypoperfusion with elevated lactic acid and worsening renal function, hypotension.   At this point not able to use afterload reduction due to risk of hypotension.  Plan to place PICC line and consult heart failure team. Check SV02  Patient may need inotropic support to augment diuresis.   Congestive hepatopathy.  03/2024 live US  with cholelithiasis, and benign hepatic hemangiomas.  Trending down liver enzymes but continue very elevated, ALT 1680 and AST 1708, total bilirubin 1.3   No signs of encephalopathy.   Essential hypertension Patient may need inotropic support Holding antihypertensive medications   Paroxysmal atrial fibrillation (HCC) Continue with amiodarone  Continue anticoagulation with apixaban .  Continue telemetry monitoring   CAD (coronary artery disease) No active chest pain.  High sensitive troponin elevation due to heart failure, no acute coronary syndrome.   Chronic kidney disease, stage 3b (HCC) AKI, hyperkalemia. Hyponatremia.   Worsening renal function with serum cr at 2,83 with K at 4,6 and serum bicarbonate at 21 Na 133 Mg 2,0   Continue with furosemide  80 mg IV bid,  May need inotropic support.  Follow up renal function and electrolytes in am.   Type 2  diabetes mellitus with hyperlipidemia (HCC) Continue glucose cover and monitoring with insulin  sliding scale Fasting glucose 114 today  Patient is tolerating po well   Continue statin therapy  Acute cystitis Past urine culture positive for  E coli sensitive for cephalosporins, continue cephalexin  for 3 days total .  One bottle blood culture positive for staphylococcus epidermidis, suspected contaminant.  Will follow up on repeat blood cultures, hold on IV antibiotic therapy.   Obesity, class 1 Calculated BMI is 31,4         Subjective: Patient continue to have dyspnea and edema, no chest pain.   Physical Exam: Vitals:   05/05/24 2007 05/06/24 0017 05/06/24 0434 05/06/24 0747  BP: (!) 108/47 (!) 95/40  (!) 111/51  Pulse:    (!) 55  Resp: 18 19 18 16   Temp: 97.7 F (36.5 C) 98.5 F (36.9 C) 98 F (36.7 C) 97.9 F (36.6 C)  TempSrc: Oral Oral Oral Oral  SpO2:    100%  Weight:   89.8 kg   Height:       Neurology awake and alert, deconditioned  ENT with mild pallor Cardiovascular with S1 and S2 present and regular with no gallops or rubs, positive systolic at the right lower sternal border, systolic murmur at the apex.  Positive JVD Lower extremity edema pitting +++ Respiratory with rales and wheezing bilaterally,poor inspiratory effort Abdomen with mild distention, with non tender, no organomegaly  Data Reviewed:    Family Communication: no family at the beside   Disposition: Status is: Inpatient Remains inpatient appropriate because: IV diuresis   Planned Discharge Destination: Home     Author: Elidia Toribio Furnace, MD 05/06/2024 9:32 AM  For on call review [email or web address].

## 2024-05-06 NOTE — Consult Note (Addendum)
 Advanced Heart Failure Team Consult Note   Primary Physician: [name], MD Cardiologist:  Vinie JAYSON Maxcy, MD  Reason for Consultation: Acute on chronic systolic CHF  HPI:    Dean Courville Sr. is seen today for evaluation of acute on chronic systolic CHF at the request of Dr. Noralee with [facility]. 73 y.o. male with history of chronic systolic CHF, LBBB, CAD, hx VT/VF arrest s/p ICD, PAF, OSA, DM II, HTN, MR. His ejection fraction has been in 30-35% range in recent years. He is followed by Dr. Maxcy in Cardiology clinic.  Cardiac history dates back to 2017 when he suffered VT/VF arrest requiring CPR/defibrillation. Cardiac cath demonstrated 90% ostial RCA and 100% m to d RCA with collaterals. CAD treated medically. Echo with LVEF 35-40% and AK of basal inferior and apical myocardium. Later underwent single chamber medtronic ICD in 09/2016. Since then EF has been in range of 30-35%.   Admitted in 12/23 d/t ICD shock for VT/VF in setting of amiodarone  noncompliance.  He was admitted in 2/25 with GI bleed requiring transfusions after polypectomy by hot snare with clips.  He was admitted 07/27-07/29/25 with acute on chronic CHF. He was diuresed with IV lasix .   Presented to the ED on 05/02/24 with shortness of breath and dysuria (recent treatment for UTI). Given IV lasix  and treated for possible UTI. Discharged home but returned the next day with worsening weakness, dyspnea and dysuria. He was admitted with respiratory distress, AKI on CKD IIIb and concern for severe sepsis d/t acute cystitis and acute on chronic CHF. Labs significant for lactic acidosis (lactic acid up to 6.6), K 6.6, AST 3,966, ALT 2,269, T bili 3.8, Scr 2.87. HS troponin mildly elevated with flat trend. He was started on abx and diuresis attempted with IV lasix . Lactic acidosis and LFTs began to improve but he's had poor response to IV diuretics.   Reports feeling okay. Dyspnea has improved. He is complaint with home  medications. States he is independent in ADLs and reportedly still works in [employer].  No ETOH or tobacco use.   Home Medications Prior to Admission medications   Medication Sig Start Date End Date Taking? Authorizing Provider  allopurinol  (ZYLOPRIM ) 300 MG tablet Take 300 mg by mouth daily. 06/13/23  Yes [provider]  amiodarone  (PACERONE ) 200 MG tablet Take 1 tablet (200 mg total) by mouth daily. 09/06/22  Yes Leverne Charlies Helling, PA-C  apixaban  (ELIQUIS ) 5 MG TABS tablet TAKE 1 TABLET BY MOUTH TWICE A DAY 05/01/24  Yes Camnitz, Will Gladis, MD  atorvastatin  (LIPITOR) 40 MG tablet Take 40 mg by mouth daily.   Yes [provider]  carvedilol  (COREG ) 12.5 MG tablet TAKE 1 TABLET (12.5MG  TOTAL) BY MOUTH TWICE A DAY WITH MEALS 08/28/23  Yes Leverne Charlies Helling, PA-C  cholecalciferol  (VITAMIN D3) 25 MCG (1000 UNIT) tablet Take 1,000 Units by mouth daily. 06/13/23  Yes [provider]  doxycycline  (VIBRAMYCIN ) 100 MG capsule Take 100 mg by mouth 2 (two) times daily. 04/29/24  Yes [provider]  ferrous sulfate  325 (65 FE) MG tablet Take 325 mg by mouth daily. 11/08/23  Yes [provider]  furosemide  (LASIX ) 20 MG tablet TAKE 1 TABLET BY MOUTH EVERY DAY 05/01/24  Yes Hilty, Vinie JAYSON, MD  guaiFENesin -codeine  100-10 MG/5ML syrup Take 5 mLs by mouth at bedtime as needed for cough. 05/02/24  Yes Yolande Lamar JAYSON, MD  valsartan  (DIOVAN ) 40 MG tablet Take 1 tablet (40 mg total) by mouth  daily. 11/27/23  Yes Vicci Rollo SAUNDERS, PA-C  furosemide  (LASIX ) 40 MG tablet Take 1 tablet (40 mg total) by mouth daily. Patient not taking: Reported on 05/04/2024 05/02/24   Yolande Lamar BROCKS, MD    Past Medical History: Past Medical History:  Diagnosis Date   AICD (automatic cardioverter/defibrillator) present 10/10/2016   a. 09/2016 s/p MDT Visia AF MRI single lead ICD (ser # [id number]).   Arthritis    maybe in his spine (09/05/2016)   Asthma    pt. denies   Cardiac  arrest (HCC)    a. 08/2016 VT/VF Arrest-->09/2016 s/p MDT Visia AF MRI single lead ICD (ser # [id number]).   Chronic HFrEF (heart failure with reduced ejection fraction) (HCC)    a. 08/2016 Echo: EF 35-40%; b. 04/2018 Echo: EF 30-35%; c. 04/2020 Echo: EF 30-35%; d. 11/2021 Echo: EF 40-45%, GrI DD, nl RV fxn, sev dil LA, mild-mod MR, mild-mod AI, Ao root 38mm.   Coronary artery disease    a. 08/2016 VT Arrest/Cath: LM nl, LAD nl, LCX nl, RGCA 90p, 100d w/ L->R collats, EF 35-45%.   Gout    Hyperlipidemia LDL goal <70    Hypertension    Ischemic cardiomyopathy    a. 08/2016 Echo: EF 35-40%; b. 04/2018 Echo: EF 30-35%; c. 04/2020 Echo: EF 30-35%; d. 11/2021 Echo: EF 40-45%.   LBBB (left bundle branch block)    OSA on CPAP    does not wear CPAP   PAF (paroxysmal atrial fibrillation) (HCC)    a. CHA2DS2VASc = 5-->Eliquis .   Type II diabetes mellitus (HCC)     Past Surgical History: Past Surgical History:  Procedure Laterality Date   CARDIAC CATHETERIZATION N/A 09/07/2016   Procedure: Left Heart Cath and Coronary Angiography;  Surgeon: Peter M Swaziland, MD;  Location: [facility] INVASIVE CV LAB;  Service: Cardiovascular;  Laterality: N/A;   COLONOSCOPY WITH PROPOFOL  N/A 08/30/2018   Procedure: COLONOSCOPY WITH PROPOFOL ;  Surgeon: Rollin Dover, MD;  Location: [facility] ENDOSCOPY;  Service: Endoscopy;  Laterality: N/A;   COLONOSCOPY WITH PROPOFOL  N/A 11/16/2023   Procedure: COLONOSCOPY WITH PROPOFOL ;  Surgeon: Rollin Dover, MD;  Location: [facility]OSCOPY;  Service: Gastroenterology;  Laterality: N/A;   COLONOSCOPY WITH PROPOFOL  N/A 11/18/2023   Procedure: COLONOSCOPY WITH PROPOFOL ;  Surgeon: Charlanne Groom, MD;  Location: [facility];  Service: Gastroenterology;  Laterality: N/A;   EP IMPLANTABLE DEVICE N/A 10/10/2016   Procedure: ICD Implant;  Surgeon: Will Gladis Norton, MD;  Location: [facility] INVASIVE CV LAB;  Service: Cardiovascular;  Laterality: N/A;   HEMOSTASIS CLIP PLACEMENT  11/16/2023   Procedure: HEMOSTASIS CLIP  PLACEMENT;  Surgeon: Rollin Dover, MD;  Location: [facility];  Service: Gastroenterology;;   HEMOSTASIS CLIP PLACEMENT  11/18/2023   Procedure: HEMOSTASIS CLIP PLACEMENT;  Surgeon: Charlanne Groom, MD;  Location: [facility];  Service: Gastroenterology;;   HEMOSTASIS CONTROL  11/18/2023   Procedure: HEMOSTASIS CONTROL;  Surgeon: Charlanne Groom, MD;  Location: [facility];  Service: Gastroenterology;;  PuraStat   LUMBAR LAMINECTOMY/DECOMPRESSION MICRODISCECTOMY N/A 01/18/2018   Procedure: L2-3, L3-4, L-4-5 CENTRAL DECOMPRESSION;  Surgeon: Barbarann Oneil BROCKS, MD;  Location: [facility] OR;  Service: Orthopedics;  Laterality: N/A;   LUMBAR LAMINECTOMY/DECOMPRESSION MICRODISCECTOMY N/A 08/12/2020   Procedure: THORACIC ELEVEN LAMINECTOMY;  Surgeon: Louis Shove, MD;  Location: [facility] OR;  Service: Neurosurgery;  Laterality: N/A;   POLYPECTOMY  08/30/2018   Procedure: POLYPECTOMY;  Surgeon: Rollin Dover, MD;  Location: [facility];  Service: Endoscopy;;   POLYPECTOMY  11/16/2023   Procedure: POLYPECTOMY;  Surgeon: Rollin,  Belvie, MD;  Location: [facility];  Service: Gastroenterology;;    Family History: Family History  Problem Relation Age of Onset   Heart attack Father        died in his 54's   Hypertension Sister    Cancer Brother        uncertain, leg   Hypertension Sister     Social History: Social History   Socioeconomic History   Marital status: Widowed    Spouse name: Not on file   Number of children: Not on file   Years of education: Not on file   Highest education level: Not on file  Occupational History   Occupation: semi-retired Proofreader  Tobacco Use   Smoking status: Never   Smokeless tobacco: Never  Vaping Use   Vaping status: Never Used  Substance and Sexual Activity   Alcohol use: No   Drug use: No   Sexual activity: Not on file  Other Topics Concern   Not on file  Social History Narrative   Lives with daughters.    Social Drivers of Corporate investment banker Strain: Not  on file  Food Insecurity: No Food Insecurity (05/04/2024)   Hunger Vital Sign    Worried About Running Out of Food in the Last Year: Never true    Ran Out of Food in the Last Year: Never true  Transportation Needs: No Transportation Needs (05/04/2024)   PRAPARE - Administrator, Civil Service (Medical): No    Lack of Transportation (Non-Medical): No  Physical Activity: Not on file  Stress: Not on file  Social Connections: Moderately Integrated (05/04/2024)   Social Connection and Isolation Panel    Frequency of Communication with Friends and Family: More than three times a week    Frequency of Social Gatherings with Friends and Family: More than three times a week    Attends Religious Services: More than 4 times per year    Active Member of [employer] or Organizations: Yes    Attends Banker Meetings: 1 to 4 times per year    Marital Status: Widowed  Recent Concern: Social Connections - Moderately Isolated (04/20/2024)   Social Connection and Isolation Panel    Frequency of Communication with Friends and Family: More than three times a week    Frequency of Social Gatherings with Friends and Family: More than three times a week    Attends Religious Services: More than 4 times per year    Active Member of [employer] or Organizations: No    Attends Banker Meetings: Never    Marital Status: Widowed    Allergies:  Allergies  Allergen Reactions   Contrast Media [Iodinated Contrast Media] Shortness Of Breath and Nausea And Vomiting   Lisinopril  Cough    Objective:    Vital Signs:   Temp:  [97.7 F (36.5 C)-98.6 F (37 C)] 97.9 F (36.6 C) (08/12 0747) Pulse Rate:  [55] 55 (08/12 0747) Resp:  [16-20] 16 (08/12 0747) BP: (95-111)/(40-51) 111/51 (08/12 0747) SpO2:  [100 %] 100 % (08/12 0747) Weight:  [89.8 kg] 89.8 kg (08/12 0434) Last BM Date : 05/03/24  Weight change: Filed Weights   05/03/24 2131 05/05/24 0400 05/06/24 0434  Weight: 88.5 kg 89.9  kg 89.8 kg    Intake/Output:   Intake/Output Summary (Last 24 hours) at 05/06/2024 1050 Last data filed at 05/06/2024 0900 Gross per 24 hour  Intake 0 ml  Output 550 ml  Net -550 ml  Physical Exam    General:  Fatigued appearing elderly male. Stting up in chair. No distress. Cor: JVP ~ 10 cm. Regular rate & rhythm. 3/6 MR murmur Lungs: diminished Abdomen: soft, nontender, nondistended. Extremities: 2-3+ edema Neuro: alert & orientedx3. Affect pleasant   Telemetry   Sinus brady 50s   Labs   Basic Metabolic Panel: Recent Labs  Lab 05/02/24 0545 05/03/24 2146 05/04/24 0000 05/04/24 0333 05/04/24 0508 05/05/24 0447 05/06/24 0548  NA 138 137  --  139 137 134* 133*  K 4.5 5.4*  --  6.6* 5.1 4.2 4.6  CL 106 100  --  102 103 100 100  CO2 21* 19*  --  21*  --  24 21*  GLUCOSE 101* 76  --  65* 77 133* 114*  BUN 25* 41*  --  45* 45* 55* 66*  CREATININE 1.72* 2.72*  --  2.87* 2.80* 2.77* 2.83*  CALCIUM  9.7 10.1  --  9.8  --  8.8* 8.8*  MG  --   --  2.2 2.1  --  2.1 2.0  PHOS  --   --   --  5.1*  --   --   --     Liver Function Tests: Recent Labs  Lab 05/02/24 0545 05/04/24 0333 05/05/24 0447  AST 23 3,966* 1,680*  ALT 23 2,269* 1,708*  ALKPHOS 65 89 82  BILITOT 1.2 3.8* 2.7*  PROT 6.7 7.1 6.0*  ALBUMIN  3.3* 3.6 3.0*   No results for input(s): LIPASE, AMYLASE in the last 168 hours. No results for input(s): AMMONIA in the last 168 hours.  CBC: Recent Labs  Lab 05/02/24 0545 05/03/24 2146 05/04/24 0113 05/04/24 0508 05/05/24 0447  WBC 8.6 12.4* 12.3*  --  10.2  NEUTROABS 6.2  --  10.2*  --   --   HGB 11.1* 12.0* 10.9* 12.2* 10.8*  HCT 36.1* 39.7 36.3* 36.0* 34.2*  MCV 93.8 94.3 95.5  --  91.4  PLT 149* 121* 60*  --  52*    Cardiac Enzymes: No results for input(s): CKTOTAL, CKMB, CKMBINDEX, TROPONINI in the last 168 hours.  BNP: BNP (last 3 results) Recent Labs    04/20/24 1052 05/03/24 2146 05/04/24 0333  BNP 2,103.7*  2,633.5* 2,770.3*    ProBNP (last 3 results) No results for input(s): PROBNP in the last 8760 hours.   CBG: Recent Labs  Lab 05/05/24 0841 05/05/24 1239 05/05/24 1628 05/05/24 2111 05/06/24 0746  GLUCAP 106* 146* 157* 112* 119*    Coagulation Studies: No results for input(s): LABPROT, INR in the last 72 hours.   Imaging   US  EKG SITE RITE Result Date: 05/06/2024 If Site Rite image not attached, placement could not be confirmed due to current cardiac rhythm.    Medications:     Current Medications:  amiodarone   200 mg Oral Daily   apixaban   5 mg Oral BID   atorvastatin   40 mg Oral Daily   cephALEXin   500 mg Oral Q12H   ferrous sulfate   325 mg Oral Daily   furosemide   80 mg Intravenous BID   insulin  aspart  0-6 Units Subcutaneous TID WC    Infusions:     Patient Profile   73 y.o. male with history of chronic systolic CHF, CAD, VT/VF arrest s/p single chamber ICD, LBBB, PAF, MR, CKD IIIb, OSA on CPAP, DM II, HTN.   Admitted with acute on chronic systolic CHF>>cardiogenic shock.  Assessment/Plan   Acute on chronic systolic CHF>>>cardiogenic shock -Lactic acid  initially > 6 w/ evidence of end organ dysfunction with elevated LFTs and AKI. One blood cuutlure positive for staph epidermidis, likely contaminant. Urine culture also contaminated. Treated for UTI, but suspect acute illness primarily d/t cardiogenic shock. -CM likely at least in part ischemic. Had single vessel RCA disease in 2017 treated medically. Also has LBBB with wide QRS ( 150 ms) and significant MR. -Echo 04/25: EF 30-35%, grade II Ddm, RV okay, RVSP 70 mmHg, severe ischemic MR, moderate to severe TR -Repeat echo -NYHA III. Suspect he is downplaying symptoms. -Poor response to IV diuretics. Will likely require inotrope support. Will plan for RHC today to definitively assess hemodynamics. If CO markedly reduced may need to transfer to ICU. Plan to leave central line post cath. -No BP room  for GDMT  Informed Consent   Shared Decision Making/Informed Consent The risks, including but not limited to, [bleeding or vascular complications (1 in 500), pneumothorax (1 in 1600), arrhythmia (1 in 1000) and death (1 in 5000)], benefits (diagnostic support and/or management of heart failure, pulmonary hypertension) and alternatives of a right heart catheterization were discussed in detail with Dean [name] and he is willing to proceed.      2. MR/TR -Severe ischemic MR/moderate to severe TR on last echo 04/25 -Repeat echo -RHC today as above  3. PAF -Previously diagnosed on device -SR this admission. No AF on device check 08/04. Switch eliquis  to heparin  gtt for now  4. Hx VT/VF -Has single chamber ICD -Continue amiodarone  200 mg daily -Elevated LFTs likely 2/2 cardiogenic shock  5. Elevated LFTs -AST peaked ~ 4,000, ALT peaked ~ 2200, T bili 3.8 -Now trending down -Likley shock liver  6. AKI on CKD IIIb -Suspect ATN in setting of shock -Will likely need inotrope support to improve CO. -Scr 2.8 today  7. Possible UTI - Abx per primary team - Hold off on addition of SGLT2i this admit  7. CAD -Single vessel RCA disease on cath in 2017. Managed medically -Not on aspirin  with need for anticoagulation. Will hold statin with elevated LFTs   Length of Stay: 2  FINCH, LINDSAY N, PA-C  05/06/2024, 10:50 AM    Advanced Heart Failure Team Pager [phone] (M-F; 7a - 5p)  Please contact [facility] for night-coverage after hours (4p -7a ) and weekends on [email or web address]   Patient seen and examined with the above-signed Advanced Practice Provider and/or Housestaff. I personally reviewed laboratory data, imaging studies and relevant notes. I independently examined the patient and formulated the important aspects of the plan. I have edited the note to reflect any of my changes or salient points. I have personally discussed the plan with the patient and/or family.  73 y/o male as  above.  Admitted with shock initially felt to be septic in nature. Course c/b AKI and shock liver. Responded mildly to IVF and abx but has persistent lactic acidosis.   Denies CP or SOB currently. SBP 110   Last echo ef 30-35% with severe MR in 4/25   Has wide LBBB on ECG  General:  Sitting up in chair No resp difficulty HEENT: normal Neck: supple. JVP 10-12 Cor: Regular rate & rhythm. No rubs, gallops or murmurs. Lungs: clear Abdomen: soft, nontender, nondistended. No hepatosplenomegaly. No bruits or masses. Good bowel sounds. Extremities: no cyanosis, clubbing, rash, 2+ edema Neuro: alert & orientedx3, cranial nerves grossly intact. moves all 4 extremities w/o difficulty. Affect pleasant  I suspect he may have significant cardiac component to his shock. Will  plan RHC today for further assessment. Will plan repeat echo as well.   D/w his daughter by phone.   Dean Fuel, MD  12:26 PM

## 2024-05-06 NOTE — Progress Notes (Signed)
 ICM remote transmission changed from 05/12/2024 to 05/19/2024 due to pt currently hospitalized.

## 2024-05-07 ENCOUNTER — Other Ambulatory Visit (HOSPITAL_COMMUNITY): Payer: Self-pay

## 2024-05-07 ENCOUNTER — Telehealth (HOSPITAL_COMMUNITY): Payer: Self-pay | Admitting: Pharmacy Technician

## 2024-05-07 ENCOUNTER — Inpatient Hospital Stay (HOSPITAL_COMMUNITY)

## 2024-05-07 DIAGNOSIS — R570 Cardiogenic shock: Secondary | ICD-10-CM | POA: Diagnosis not present

## 2024-05-07 DIAGNOSIS — I5023 Acute on chronic systolic (congestive) heart failure: Secondary | ICD-10-CM | POA: Diagnosis not present

## 2024-05-07 LAB — CBC
HCT: 29.5 % — ABNORMAL LOW (ref 39.0–52.0)
Hemoglobin: 9.5 g/dL — ABNORMAL LOW (ref 13.0–17.0)
MCH: 28.6 pg (ref 26.0–34.0)
MCHC: 32.2 g/dL (ref 30.0–36.0)
MCV: 88.9 fL (ref 80.0–100.0)
Platelets: 54 K/uL — ABNORMAL LOW (ref 150–400)
RBC: 3.32 MIL/uL — ABNORMAL LOW (ref 4.22–5.81)
RDW: 15.6 % — ABNORMAL HIGH (ref 11.5–15.5)
WBC: 9.5 K/uL (ref 4.0–10.5)
nRBC: 0 % (ref 0.0–0.2)

## 2024-05-07 LAB — RESPIRATORY PANEL BY PCR

## 2024-05-07 LAB — GLUCOSE, CAPILLARY
Glucose-Capillary: 97 mg/dL (ref 70–99)
Glucose-Capillary: 125 mg/dL — ABNORMAL HIGH (ref 70–99)
Glucose-Capillary: 128 mg/dL — ABNORMAL HIGH (ref 70–99)
Glucose-Capillary: 130 mg/dL — ABNORMAL HIGH (ref 70–99)

## 2024-05-07 LAB — APTT: aPTT: 97 s — ABNORMAL HIGH (ref 24–36)

## 2024-05-07 LAB — VITAMIN B12: Vitamin B-12: 2079 pg/mL — ABNORMAL HIGH (ref 180–914)

## 2024-05-07 LAB — HEPARIN LEVEL (UNFRACTIONATED): Heparin Unfractionated: >1.1 [IU]/mL — ABNORMAL HIGH (ref 0.30–0.70)

## 2024-05-07 LAB — COMPREHENSIVE METABOLIC PANEL WITH GFR
ALT: 866 U/L — ABNORMAL HIGH (ref 0–44)
AST: 389 U/L — ABNORMAL HIGH (ref 15–41)
Albumin: 2.5 g/dL — ABNORMAL LOW (ref 3.5–5.0)
Alkaline Phosphatase: 74 U/L (ref 38–126)
Anion gap: 9 (ref 5–15)
BUN: 69 mg/dL — ABNORMAL HIGH (ref 8–23)
CO2: 25 mmol/L (ref 22–32)
Calcium: 8.8 mg/dL — ABNORMAL LOW (ref 8.9–10.3)
Chloride: 99 mmol/L (ref 98–111)
Creatinine, Ser: 3.1 mg/dL — ABNORMAL HIGH (ref 0.61–1.24)
GFR, Estimated: 20 mL/min — ABNORMAL LOW (ref >60)
Glucose, Bld: 97 mg/dL (ref 70–99)
Potassium: 3.6 mmol/L (ref 3.5–5.1)
Sodium: 133 mmol/L — ABNORMAL LOW (ref 135–145)
Total Bilirubin: 2.4 mg/dL — ABNORMAL HIGH (ref 0.0–1.2)
Total Protein: 5.4 g/dL — ABNORMAL LOW (ref 6.5–8.1)

## 2024-05-07 LAB — CULTURE, BLOOD (ROUTINE X 2)

## 2024-05-07 LAB — FERRITIN: Ferritin: 153 ng/mL (ref 24–336)

## 2024-05-07 LAB — IRON AND TIBC
Iron: 19 ug/dL — ABNORMAL LOW (ref 45–182)
Saturation Ratios: 12 % — ABNORMAL LOW (ref 17.9–39.5)
TIBC: 155 ug/dL — ABNORMAL LOW (ref 250–450)
UIBC: 136 ug/dL

## 2024-05-07 LAB — COOXEMETRY PANEL
Carboxyhemoglobin: 2.4 % — ABNORMAL HIGH (ref 0.5–1.5)
Methemoglobin: 1.3 % (ref 0.0–1.5)
O2 Saturation: 71.7 %
Total hemoglobin: 9.9 g/dL — ABNORMAL LOW (ref 12.0–16.0)

## 2024-05-07 LAB — RETICULOCYTES
Immature Retic Fract: 11.3 % (ref 2.3–15.9)
RBC.: 3.02 MIL/uL — ABNORMAL LOW (ref 4.22–5.81)
Retic Count, Absolute: 23.3 K/uL (ref 19.0–186.0)
Retic Ct Pct: 0.8 % (ref 0.4–3.1)

## 2024-05-07 LAB — MAGNESIUM: Magnesium: 1.9 mg/dL (ref 1.7–2.4)

## 2024-05-07 LAB — FOLATE: Folate: 9.4 ng/mL (ref >5.9)

## 2024-05-07 LAB — LACTIC ACID, PLASMA: Lactic Acid, Venous: 1.6 mmol/L (ref 0.5–1.9)

## 2024-05-07 MED ORDER — APIXABAN 5 MG PO TABS
5.0000 mg | ORAL_TABLET | Freq: Two times a day (BID) | ORAL | Status: DC
  Administered 2024-05-07 – 2024-05-10 (×9): 5 mg via ORAL
  Filled 2024-05-07 (×7): qty 1

## 2024-05-07 MED ORDER — DEXTROSE 5 % IV SOLN
500.0000 mg | Freq: Once | INTRAVENOUS | Status: DC
  Filled 2024-05-07: qty 17.8

## 2024-05-07 MED ORDER — FUROSEMIDE 10 MG/ML IJ SOLN
120.0000 mg | Freq: Once | INTRAVENOUS | Status: AC
  Administered 2024-05-07 (×2): 120 mg via INTRAVENOUS
  Filled 2024-05-07: qty 2

## 2024-05-07 MED ORDER — FUROSEMIDE 10 MG/ML IJ SOLN
20.0000 mg/h | INTRAVENOUS | Status: DC
  Administered 2024-05-07 (×2): 20 mg/h via INTRAVENOUS
  Filled 2024-05-07: qty 20

## 2024-05-07 MED ORDER — MAGNESIUM SULFATE 2 GM/50ML IV SOLN
2.0000 g | Freq: Once | INTRAVENOUS | Status: AC
  Administered 2024-05-07 (×2): 2 g via INTRAVENOUS
  Filled 2024-05-07: qty 50

## 2024-05-07 MED ORDER — PERFLUTREN LIPID MICROSPHERE
1.0000 mL | INTRAVENOUS | Status: AC | PRN
  Administered 2024-05-07 (×2): 2 mL via INTRAVENOUS

## 2024-05-07 MED ORDER — POLYETHYLENE GLYCOL 3350 17 G PO PACK
17.0000 g | PACK | Freq: Every day | ORAL | Status: DC
  Administered 2024-05-07 – 2024-05-08 (×3): 17 g via ORAL
  Filled 2024-05-07 (×3): qty 1

## 2024-05-07 MED ORDER — POTASSIUM CHLORIDE CRYS ER 20 MEQ PO TBCR
40.0000 meq | EXTENDED_RELEASE_TABLET | Freq: Once | ORAL | Status: AC
  Administered 2024-05-07 (×2): 40 meq via ORAL
  Filled 2024-05-07: qty 2

## 2024-05-07 NOTE — Plan of Care (Signed)
  Problem: Education: Goal: Ability to describe self-care measures that may prevent or decrease complications (Diabetes Survival Skills Education) will improve Outcome: Progressing   Problem: Coping: Goal: Ability to adjust to condition or change in health will improve Outcome: Progressing   Problem: Fluid Volume: Goal: Ability to maintain a balanced intake and output will improve Outcome: Progressing   Problem: Health Behavior/Discharge Planning: Goal: Ability to manage health-related needs will improve Outcome: Progressing   Problem: Nutritional: Goal: Maintenance of adequate nutrition will improve Outcome: Progressing   Problem: Tissue Perfusion: Goal: Adequacy of tissue perfusion will improve Outcome: Progressing

## 2024-05-07 NOTE — Telephone Encounter (Signed)
 Patient Product/process development scientist completed.    The patient is insured through [employer]. Patient has Medicare and is not eligible for a copay card, but may be able to apply for patient assistance or Medicare RX Payment Plan (Patient Must reach out to their plan, if eligible for payment plan), if available.    Ran test claim for sacubitril -valsartan  (Entresto ) 24-26 mg and the current 30 day co-pay is $4.90.  Ran test claim for Farxiga 10 mg and the current 30 day co-pay is $4.90.  Ran test claim for Jardaince 10 mg and the current 30 day co-pay is $12.15.  This test claim was processed through [facility]- copay amounts may vary at other pharmacies due to pharmacy/plan contracts, or as the patient moves through the different stages of their insurance plan.     Reyes Sharps, CPHT Pharmacy Technician III Certified Patient Advocate [facility] Patient Advocate Team Direct Number: [phone]  Fax: [phone]

## 2024-05-07 NOTE — Progress Notes (Addendum)
 No significant UOP following 120 mg of Lasix  IV. Redosed with Lasix  120 mg with gtt at 20 mg/hr.SABRA

## 2024-05-07 NOTE — Progress Notes (Signed)
 PHARMACY - ANTICOAGULATION CONSULT NOTE  Pharmacy Consult for heparin  Indication: atrial fibrillation  Allergies  Allergen Reactions   Contrast Media [Iodinated Contrast Media] Shortness Of Breath and Nausea And Vomiting   Lisinopril  Cough    Patient Measurements: Height: 5' 6 (167.6 cm) Weight: 91.7 kg (202 lb 2.6 oz) IBW/kg (Calculated) : 63.8 HEPARIN  DW (KG): 83.2  Vital Signs: Temp: 97.7 F (36.5 C) (08/13 0330) Temp Source: Oral (08/13 0330) BP: 107/39 (08/13 0032) Pulse Rate: 57 (08/13 0032)  Labs: Recent Labs    05/05/24 0447 05/06/24 0548 05/06/24 1241 05/07/24 0554  HGB 10.8*  --  11.9*  12.2* 9.5*  HCT 34.2*  --  35.0*  36.0* 29.5*  PLT 52*  --   --  54*  APTT  --   --   --  97*  HEPARINUNFRC  --   --   --  >1.10*  CREATININE 2.77* 2.83*  --  3.10*    Estimated Creatinine Clearance: 22.2 mL/min (A) (by C-G formula based on SCr of 3.1 mg/dL (H)).   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 09/2016 s/p MDT Visia AF MRI single lead ICD (ser # [id number]).   Arthritis    maybe in his spine (09/05/2016)   Asthma    pt. denies   Cardiac arrest (HCC)    a. 08/2016 VT/VF Arrest-->09/2016 s/p MDT Visia AF MRI single lead ICD (ser # [id number]).   Chronic HFrEF (heart failure with reduced ejection fraction) (HCC)    a. 08/2016 Echo: EF 35-40%; b. 04/2018 Echo: EF 30-35%; c. 04/2020 Echo: EF 30-35%; d. 11/2021 Echo: EF 40-45%, GrI DD, nl RV fxn, sev dil LA, mild-mod MR, mild-mod AI, Ao root 38mm.   Coronary artery disease    a. 08/2016 VT Arrest/Cath: LM nl, LAD nl, LCX nl, RGCA 90p, 100d w/ L->R collats, EF 35-45%.   Gout    Hyperlipidemia LDL goal <70    Hypertension    Ischemic cardiomyopathy    a. 08/2016 Echo: EF 35-40%; b. 04/2018 Echo: EF 30-35%; c. 04/2020 Echo: EF 30-35%; d. 11/2021 Echo: EF 40-45%.   LBBB (left bundle branch block)    OSA on CPAP    does not wear  CPAP   PAF (paroxysmal atrial fibrillation) (HCC)    a. CHA2DS2VASc = 5-->Eliquis .   Type II diabetes mellitus (HCC)      Assessment: 61 yoM admitted with CHF. Pt has hx AF on apixaban , pharmacy to transition to IV heparin .  Last apixaban  dose was 8/12 at 0923. Will dose via aPTT until correlating with heparin  levels. Initial aPTT is therapeutic at 97 seconds, heparin  level falsely high due to DOAC. Will reduce slightly given heparin  at upper end of range.  Goal of Therapy:  Heparin  level 0.3-0.7 units/ml aPTT 66-102 seconds Monitor platelets by anticoagulation protocol: Yes   Plan:  -Reduce heparin  to 1100 units/h -Monitor heparin  level, CBC, S/Sx bleeding daily  Ozell Jamaica, PharmD, BCPS, [facility] Clinical Pharmacist [phone] Please check AMION for all [facility] Pharmacy numbers 05/07/2024

## 2024-05-07 NOTE — Progress Notes (Signed)
  Echocardiogram 2D Echocardiogram has been performed.  Devora Ellouise SAUNDERS 05/07/2024, 8:51 AM

## 2024-05-07 NOTE — Progress Notes (Signed)
 Advanced Heart Failure Rounding Note  Cardiologist: Vinie JAYSON Maxcy, MD  Chief Complaint: Cough  Subjective:   8/12- RHC - Elevated filling pressures. Fick CO 4.1/CI 2.1  Thermo CO 3.6/ CI 1.8. Started on milrinone  0.25 mcg + lasix  drip.   I/O not accurate   Complaining of cough.   Objective:   Weight Range: 91.7 kg Body mass index is 32.63 kg/m.   Vital Signs:   Temp:  [97.7 F (36.5 C)-98.2 F (36.8 C)] 97.7 F (36.5 C) (08/13 0744) Pulse Rate:  [57-71] 57 (08/13 0032) Resp:  [17-27] 19 (08/13 0744) BP: (99-145)/(35-85) 125/40 (08/13 0744) SpO2:  [92 %-98 %] 92 % (08/13 0744) Weight:  [91.1 kg-91.7 kg] 91.7 kg (08/13 0628) Last BM Date : 05/03/24  Weight change: Filed Weights   05/06/24 0434 05/06/24 1510 05/07/24 0628  Weight: 89.8 kg 91.1 kg 91.7 kg    Intake/Output:   Intake/Output Summary (Last 24 hours) at 05/07/2024 0913 Last data filed at 05/07/2024 0900 Gross per 24 hour  Intake 1058.22 ml  Output 300 ml  Net 758.22 ml     CVP personally checked 3-4 Physical Exam   General:   No resp difficulty Neck: no JVD.  Cor: Regular rate & rhythm.  Lungs: EW throughout on 2 liters Abdomen: soft, nontender, nondistended.  Extremities: trace lower extremity  edema Neuro: alert & oriented x3   Telemetry  SR 60s   EKG    Labs    CBC Recent Labs    05/05/24 0447 05/06/24 1241 05/07/24 0554  WBC 10.2  --  9.5  HGB 10.8* 11.9*  12.2* 9.5*  HCT 34.2* 35.0*  36.0* 29.5*  MCV 91.4  --  88.9  PLT 52*  --  54*   Basic Metabolic Panel Recent Labs    91/87/74 0548 05/06/24 1241 05/07/24 0554  NA 133* 138  136 133*  K 4.6 3.7  3.9 3.6  CL 100  --  99  CO2 21*  --  25  GLUCOSE 114*  --  97  BUN 66*  --  69*  CREATININE 2.83*  --  3.10*  CALCIUM  8.8*  --  8.8*  MG 2.0  --  1.9   Liver Function Tests Recent Labs    05/06/24 0929 05/07/24 0554  AST 696* 389*  ALT 1,188* 866*  ALKPHOS 81 74  BILITOT 2.3* 2.4*  PROT 5.8* 5.4*   ALBUMIN  2.8* 2.5*   No results for input(s): LIPASE, AMYLASE in the last 72 hours. Cardiac Enzymes No results for input(s): CKTOTAL, CKMB, CKMBINDEX, TROPONINI in the last 72 hours.  BNP: BNP (last 3 results) Recent Labs    04/20/24 1052 05/03/24 2146 05/04/24 0333  BNP 2,103.7* 2,633.5* 2,770.3*    ProBNP (last 3 results) No results for input(s): PROBNP in the last 8760 hours.   D-Dimer No results for input(s): DDIMER in the last 72 hours. Hemoglobin A1C No results for input(s): HGBA1C in the last 72 hours. Fasting Lipid Panel No results for input(s): CHOL, HDL, LDLCALC, TRIG, CHOLHDL, LDLDIRECT in the last 72 hours. Thyroid Function Tests No results for input(s): TSH, T4TOTAL, T3FREE, THYROIDAB in the last 72 hours.  Invalid input(s): FREET3  Other results:   Imaging    DG CHEST PORT 1 VIEW Result Date: 05/06/2024 CLINICAL DATA:  Central line EXAM: PORTABLE CHEST 1 VIEW COMPARISON:  Chest x-ray 05/03/2024 FINDINGS: There is a new right-sided central venous catheter with distal tip projecting over the SVC. There is  no pneumothorax. Left-sided pacemaker again seen. Heart is enlarged, unchanged. No focal lung consolidation, pleural effusion or acute fracture. IMPRESSION: New right-sided central venous catheter with distal tip projecting over the SVC. No pneumothorax. Electronically Signed   By: Greig Pique M.D.   On: 05/06/2024 20:55   CARDIAC CATHETERIZATION Result Date: 05/06/2024 Findings: RA = 15 RV = 63/13 PA = 64/27 (39) PCW = 25 (v waves to 40) Fick cardiac output/index = 4.1/2.1 Thermo CO/CI = 3.6/1.8 PVR = 3.4 (Fick) 3.8 (TD) Ao sat = 97% PA sat = 51%, 53% PAPi = 2.5 Assessment: 1. Biventricular HF with elevated filling pressures and low cardiac output 2. Mixed moderate pulmonary HTN 3. Prominent v-waves in PCW tracing suggestive of significant MR Plan/Discussion: Start milrinone . Leave central line in to follow co-ox and  CVP. Repeat echo. Diurese. Daniel Bensimhon, MD 4:41 PM    Medications:     Scheduled Medications:  amiodarone   200 mg Oral Daily   Chlorhexidine  Gluconate Cloth  6 each Topical Daily   ferrous sulfate   325 mg Oral Daily   insulin  aspart  0-6 Units Subcutaneous TID WC   polyethylene glycol  17 g Oral Daily   potassium chloride   40 mEq Oral Once    Infusions:  heparin  1,100 Units/hr (05/07/24 0808)   magnesium  sulfate bolus IVPB     milrinone  0.25 mcg/kg/min (05/07/24 0104)    PRN Medications: acetaminophen  **OR** acetaminophen , guaiFENesin -dextromethorphan , ipratropium-albuterol , melatonin, ondansetron  (ZOFRAN ) IV, perflutren  lipid microspheres (DEFINITY ) IV suspension    Patient Profile   73 y.o. male with history of chronic systolic CHF, CAD, VT/VF arrest s/p single chamber ICD, LBBB, PAF, MR, CKD IIIb, OSA on CPAP, DM II, HTN.    Admitted with acute on chronic systolic CHF>>cardiogenic shock.  Assessment/Plan   Acute on chronic systolic CHF>>>cardiogenic shock -Lactic acid initially > 6 w/ evidence of end organ dysfunction with elevated LFTs and AKI. One blood cuutlure positive for staph epidermidis, likely contaminant. Urine culture also contaminated. Treated for UTI, but suspect acute illness primarily d/t cardiogenic shock. -CM likely at least in part ischemic. Had single vessel RCA disease in 2017 treated medically. Also has LBBB with wide QRS ( 150 ms) and significant MR. -Echo 04/25: EF 30-35%, grade II Ddm, RV okay, RVSP 70 mmHg, severe ischemic MR, moderate to severe TR -Repeat echo -NYHA III. RHC with elevated filling pressures thermo CI 1.9.  Post cath diuresed with IV lasix  and started on milrinone .  CO-OX 71%. QX calculate fick CO 6.7 CI 3.2. Continue milrinone  0.25 mcg. Repeat lactic acid.  Surprisingly CVP down 3-4. I/O not accurate. Worsening renal dysfunction.   GDMT limited by renal dysfunction/UTI.   MR/TR -Severe ischemic MR/moderate to severe TR on  last echo 04/25 - Echo this admit reviewed by Dr [name]. Moderate MR.    PAF -Previously diagnosed on device -SR this admission. No AF on device check 08/04. - Continue amio 200 mg daily  -Stop Heparin  drip. Restart Eliquis  5 mg twice a day.     Hx VT/VF -Has single chamber ICD -Continue amiodarone  200 mg daily -Elevated LFTs likely 2/2 cardiogenic shock    Elevated LFTs -AST peaked ~ 4,000, ALT peaked ~ 2200, T bili 3.8 -Now trending down -Likley shock liver    AKI on CKD IIIb -Suspect ATN in setting of shock -Scr 2.8 >3.1. CVP trending down. Holding lasix .     Possible UTI - Abx per primary team - Hold off on addition of SGLT2i this admit  CAD -Single vessel RCA disease on cath in 2017. Managed medically -Not on aspirin  with need for anticoagulation -No chest pain.   -Statin has been held. LFTs trending down.    Anemia  Hgb drifting down. No obvious source. Follow.  Check anemia panel.    Cough  No history of COPD. Productive cough.   CT abd? Pneumonia? Aspiration. Will review with Dr [name]. Consider swallow study.  Wheezing on exam today. Obtain 2 view CXR. WBC is not elevated. Obtain respiratory panel.   Length of Stay: 3  Demetria Iwai, NP  05/07/2024, 9:13 AM  Advanced Heart Failure Team Pager [phone] (M-F; 7a - 5p)  Please contact [facility] for night-coverage after hours (5p -7a ) and weekends on [email or web address]

## 2024-05-07 NOTE — Progress Notes (Signed)
 Pt had 1 urine occurrence, IVPB Lasix  administered, condom catheter placed, no urinary output, bladder scan volume 34 mL. Attempted to notify [place], MD.   Lonell LITTIE Lyme, RN

## 2024-05-07 NOTE — Progress Notes (Addendum)
 Physical Therapy Treatment Patient Details Name: Dean Majano Sr. MRN: [id number] DOB: 1951-01-02 Today's Date: 05/07/2024   History of Present Illness Pt is a 73 y.o. male admitted with acute on chronic systolic/diastolic heart failure, acute hypoxic respiratory distress, severe sepsis due to acute cystitis, AKI and mild hyperkalemia. Respiratory panel positive for metapneumovirus 8/13. 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    PT Comments  Pt received in supine, lethargic but A&O x4 and agreeable to therapy session with good participation and fair tolerance for transfer and stair negotiation at bedside. Pt needing up to minA for bed mobility from flat bed using rail, and up to CGA for transfers and stair negotiation on 7 platform step in room x10 reps. DBP soft in supine and seated postures but improves once up in recliner. Pt c/o bil heel pain, RN notified so staff can reinforce education on floating his heels and assess for need for foam dressing or other. SpO2 89% on RA in chair so pt replaced on 2.5L O2 [place] with improvement to 97% at end of session. SpO2 90-93% with seated/standing activity on RA. Pt continues to benefit from PT services to progress toward functional mobility goals, continue to recommend HHPT upon DC..   Orthostatic Lying   BP- Lying -HOB flat (!) 114/37 MAP (59)   Pulse- Lying 65  Orthostatic Sitting  BP- Sitting - EOB 128/34 MAP (63)  Pulse- Sitting 66   Orthostatic Sitting  BP- Sitting 109/72 MAP (82) (recliner chair)  Pulse- Sitting 66     If plan is discharge home, recommend the following: A little help with walking and/or transfers;A little help with bathing/dressing/bathroom;Assistance with cooking/housework;Help with stairs or ramp for entrance   Can travel by private  vehicle        Equipment Recommendations  None recommended by PT (pt has rollator, cane and 3in1)    Recommendations for Other Services       Precautions / Restrictions Precautions Precautions: Fall Recall of Precautions/Restrictions: Intact Precaution/Restrictions Comments: Droplet precs; watch BP/O2 Restrictions Weight Bearing Restrictions Per Provider Order: No     Mobility  Bed Mobility Overal bed mobility: Needs Assistance Bed Mobility: Supine to Sit     Supine to sit: Used rails, Min assist     General bed mobility comments: Increased time/effort to perform from flat bed and pt using rails, light minA for trunk lift assist from flat bed.    Transfers Overall transfer level: Needs assistance Equipment used: Rolling walker (2 wheels) Transfers: Sit to/from Stand Sit to Stand: Contact guard assist           General transfer comment: from EOB>RW and RW>recliner, cues for safe BLE and UE placement prior to standing/sitting. Increased time to perform.    Ambulation/Gait Ambulation/Gait assistance: Contact guard assist Gait Distance (Feet): 10 Feet Assistive device: Rolling walker (2 wheels) Gait Pattern/deviations: Step-through pattern, Decreased stride length, Trunk flexed, Shuffle Gait velocity: dec Gait velocity interpretation: <1.31 ft/sec, indicative of household ambulator   General Gait Details: Pivotal steps from bed to chair and steps (see below) while standing, pt more lethargic this date and now on Droplet precs so defer longer distance after stair training. Pt agreeable to sit up in chair post-exertion and reports ~4/10 modified RPE (Fatigue).   Stairs Stairs: Yes Stairs assistance: Contact guard assist Stair Management: Two rails,  Step to pattern, Forwards, Backwards Number of Stairs: 10 General stair comments: single 7 platform step in room x10 reps (brief standing break after first 5 reps), cues for sequencing/safety and activity pacing. Pt  does better ascending with RLE and descending with LLE, slightly decreased eccentric control when stepping down with RLE first. BUE support of RW to simulate railings. Pt reports ~13 steps to get to his second floor at home.   Wheelchair Mobility     Tilt Bed    Modified Rankin (Stroke Patients Only)       Balance Overall balance assessment: Needs assistance Sitting-balance support: No upper extremity supported, Feet supported Sitting balance-Leahy Scale: Good     Standing balance support: Bilateral upper extremity supported, During functional activity, Reliant on assistive device for balance Standing balance-Leahy Scale: Poor (without support) Standing balance comment: appears reliant on RW                            Communication Communication Communication: No apparent difficulties  Cognition Arousal: Lethargic Behavior During Therapy: WFL for tasks assessed/performed, Flat affect   PT - Cognitive impairments: Safety/Judgement                       PT - Cognition Comments: More lethargic today and DBP soft in supine and seated postures but pt denies symptoms; A&O to self, situation, location, month/year and day of week. RN/NT notified of pt lethargy and wet bed linens/gown and NT arriving to change bed linens/gown at end of session. Following commands: Intact      Cueing Cueing Techniques: Verbal cues, Gestural cues  Exercises General Exercises - Lower Extremity Ankle Circles/Pumps: AROM, Both, 5 reps, Seated (recliner; encouraged hourly x10-15 reps)    General Comments General comments (skin integrity, edema, etc.): SpO2 95-99% on 2.5L O2 [place] when PTA arrived, weaned to RA for session. 90-93% on RA with seated/standing tasks; SpO2 89% on RA in chair so PTA replaced pt on 2.5 L O2 [place] while up in recliner, RN/NT notified. Pt bed linens and gown noted to be wet when PTA arrived to his room (presumably from urinal) and NT arriving to change bed sheets at  end of session.      Pertinent Vitals/Pain Pain Assessment Pain Assessment: Faces Faces Pain Scale: Hurts even more Pain Location: bil heel pain in supine Pain Descriptors / Indicators: Discomfort, Grimacing, Throbbing Pain Intervention(s): Limited activity within patient's tolerance, Monitored during session, Repositioned, Other (comment) (RN notified in case pt needs prevalon boots or heels floated)    Home Living                          Prior Function            PT Goals (current goals can now be found in the care plan section) Acute Rehab PT Goals Patient Stated Goal: Get well, go home, work (as a Education officer, environmental) PT Goal Formulation: With patient Time For Goal Achievement: 05/18/24 Progress towards PT goals: Progressing toward goals    Frequency    Min 2X/week      PT Plan      Co-evaluation              AM-PAC PT 6 Clicks Mobility   Outcome Measure  Help needed turning from your back to your side while in a flat bed without using bedrails?: None Help needed moving from lying  on your back to sitting on the side of a flat bed without using bedrails?: A Little Help needed moving to and from a bed to a chair (including a wheelchair)?: A Little Help needed standing up from a chair using your arms (e.g., wheelchair or bedside chair)?: A Little Help needed to walk in hospital room?: A Little Help needed climbing 3-5 steps with a railing? : A Little 6 Click Score: 19    End of Session Equipment Utilized During Treatment: Gait belt;Oxygen (Weaned to RA during session and replaced on 2.5L O2 once up in chair) Activity Tolerance: Patient tolerated treatment well;Patient limited by lethargy Patient left: in chair;with call bell/phone within reach;Other (comment) (rec chair alarm but none in room -NT/RN notified) Nurse Communication: Mobility status;Other (comment) (O2 readings and BP readings with postural changes) PT Visit Diagnosis: Unsteadiness on feet  (R26.81);Other abnormalities of gait and mobility (R26.89);Muscle weakness (generalized) (M62.81);Difficulty in walking, not elsewhere classified (R26.2)     Time: 8684-8661 PT Time Calculation (min) (ACUTE ONLY): 23 min  Charges:    $Gait Training: 8-22 mins $Therapeutic Activity: 8-22 mins PT General Charges $$ ACUTE PT VISIT: 1 Visit                     Adryan Shin P., PTA Acute Rehabilitation Services Secure Chat Preferred 9a-5:30pm Office: [phone]    Connell HERO [facility] 05/07/2024, 2:07 PM

## 2024-05-07 NOTE — Progress Notes (Addendum)
   Reviewed CXR  + Respiratory Panel -->metapneumovirus.   Lactic acid clearing.   Hold diuretics today. Will need to restart tomorrow.   Consider weaning milrinone  tomorrow.   Discussed with Dr [name].    Shakiara Lukic NP-C  2:44 PM

## 2024-05-08 DIAGNOSIS — R570 Cardiogenic shock: Secondary | ICD-10-CM | POA: Diagnosis not present

## 2024-05-08 DIAGNOSIS — I5023 Acute on chronic systolic (congestive) heart failure: Secondary | ICD-10-CM | POA: Diagnosis not present

## 2024-05-08 LAB — ECHOCARDIOGRAM COMPLETE
AR max vel: 3.2 cm2
AV Area VTI: 2.96 cm2
AV Mean grad: 10 mmHg
AV Peak grad: 17.8 mmHg
Ao pk vel: 2.11 m/s
Height: 66 in
MV M vel: 4.87 m/s
MV Peak grad: 94.9 mmHg
MV VTI: 3.19 cm2
MV Vena cont: 0.6 cm
S' Lateral: 4.8 cm
Weight: 3234.59 [oz_av]

## 2024-05-08 LAB — MAGNESIUM: Magnesium: 2.3 mg/dL (ref 1.7–2.4)

## 2024-05-08 LAB — BASIC METABOLIC PANEL WITH GFR
Anion gap: 5 (ref 5–15)
BUN: 74 mg/dL — ABNORMAL HIGH (ref 8–23)
CO2: 25 mmol/L (ref 22–32)
Calcium: 8.8 mg/dL — ABNORMAL LOW (ref 8.9–10.3)
Chloride: 103 mmol/L (ref 98–111)
Creatinine, Ser: 3.09 mg/dL — ABNORMAL HIGH (ref 0.61–1.24)
GFR, Estimated: 20 mL/min — ABNORMAL LOW (ref >60)
Glucose, Bld: 95 mg/dL (ref 70–99)
Potassium: 4.2 mmol/L (ref 3.5–5.1)
Sodium: 133 mmol/L — ABNORMAL LOW (ref 135–145)

## 2024-05-08 LAB — CBC
HCT: 30.3 % — ABNORMAL LOW (ref 39.0–52.0)
Hemoglobin: 9.9 g/dL — ABNORMAL LOW (ref 13.0–17.0)
MCH: 28.5 pg (ref 26.0–34.0)
MCHC: 32.7 g/dL (ref 30.0–36.0)
MCV: 87.3 fL (ref 80.0–100.0)
Platelets: 67 K/uL — ABNORMAL LOW (ref 150–400)
RBC: 3.47 MIL/uL — ABNORMAL LOW (ref 4.22–5.81)
RDW: 15.9 % — ABNORMAL HIGH (ref 11.5–15.5)
WBC: 9.2 K/uL (ref 4.0–10.5)
nRBC: 0 % (ref 0.0–0.2)

## 2024-05-08 LAB — GLUCOSE, CAPILLARY
Glucose-Capillary: 111 mg/dL — ABNORMAL HIGH (ref 70–99)
Glucose-Capillary: 135 mg/dL — ABNORMAL HIGH (ref 70–99)
Glucose-Capillary: 139 mg/dL — ABNORMAL HIGH (ref 70–99)
Glucose-Capillary: 121 mg/dL — ABNORMAL HIGH (ref 70–99)

## 2024-05-08 LAB — COOXEMETRY PANEL
Carboxyhemoglobin: 1.9 % — ABNORMAL HIGH (ref 0.5–1.5)
Carboxyhemoglobin: 2.5 % — ABNORMAL HIGH (ref 0.5–1.5)
Methemoglobin: <0.7 % (ref 0.0–1.5)
Methemoglobin: <0.7 % (ref 0.0–1.5)
O2 Saturation: 75.3 %
O2 Saturation: 68.3 %
Total hemoglobin: 10.4 g/dL — ABNORMAL LOW (ref 12.0–16.0)
Total hemoglobin: 9.2 g/dL — ABNORMAL LOW (ref 12.0–16.0)

## 2024-05-08 MED ORDER — HYDRALAZINE HCL 25 MG PO TABS
25.0000 mg | ORAL_TABLET | Freq: Three times a day (TID) | ORAL | Status: DC
  Administered 2024-05-08 – 2024-05-10 (×5): 25 mg via ORAL
  Filled 2024-05-08 (×6): qty 1

## 2024-05-08 MED ORDER — FUROSEMIDE 40 MG PO TABS
80.0000 mg | ORAL_TABLET | Freq: Every day | ORAL | Status: DC
  Administered 2024-05-08: 80 mg via ORAL
  Filled 2024-05-08: qty 2

## 2024-05-08 MED ORDER — ISOSORBIDE MONONITRATE ER 30 MG PO TB24
30.0000 mg | ORAL_TABLET | Freq: Every day | ORAL | Status: DC
  Administered 2024-05-08 – 2024-05-10 (×3): 30 mg via ORAL
  Filled 2024-05-08 (×3): qty 1

## 2024-05-08 NOTE — Progress Notes (Signed)
 Occupational Therapy Treatment Patient Details Name: Dean Pizzi Sr. MRN: [id number] DOB: 1951/01/24 Today's Date: 05/08/2024   History of present illness Pt is a 73 y.o. male admitted with acute on chronic systolic/diastolic heart failure, acute hypoxic respiratory distress, severe sepsis due to acute cystitis, AKI and mild hyperkalemia. Respiratory panel positive for metapneumovirus 8/13. 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   OT comments  Patient received in supine and agreeable to OT treatment. Patient was found with wet gown and bed. Patient able to get to EOB with CGA and performed bathing and dressing from EOB with setup and min assist for LB bathing. Patient transferred to recliner with CGA and provided breakfast. Discharge recommendations continue to be appropriate. Acute OT to continue to follow to address established goals.       If plan is discharge home, recommend the following:  A little help with walking and/or transfers;A little help with bathing/dressing/bathroom;Assist for transportation;Help with stairs or ramp for entrance;Assistance with cooking/housework   Equipment Recommendations  None recommended by OT    Recommendations for Other Services      Precautions / Restrictions Precautions Precautions: Fall Recall of Precautions/Restrictions: Intact Precaution/Restrictions Comments: Droplet precs; watch BP/O2 Restrictions Weight Bearing Restrictions Per Provider Order: No       Mobility Bed Mobility Overal bed mobility: Needs Assistance Bed Mobility: Supine to Sit     Supine to sit: Contact guard, Used rails     General bed mobility comments: increased time and CGA with cues to scoot towards EOB    Transfers Overall transfer level: Needs assistance Equipment  used: Rolling walker (2 wheels) Transfers: Sit to/from Stand, Bed to chair/wheelchair/BSC Sit to Stand: Contact guard assist     Step pivot transfers: Contact guard assist     General transfer comment: cues for hand placement and CGA for safety and assistance with lines     Balance Overall balance assessment: Needs assistance Sitting-balance support: No upper extremity supported, Feet supported Sitting balance-Leahy Scale: Good Sitting balance - Comments: performed self care seated on EOB   Standing balance support: Bilateral upper extremity supported, During functional activity, Reliant on assistive device for balance Standing balance-Leahy Scale: Poor Standing balance comment: reliant on UE support                           ADL either performed or assessed with clinical judgement   ADL Overall ADL's : Needs assistance/impaired Eating/Feeding: Independent;Sitting   Grooming: Wash/dry hands;Wash/dry face;Set up;Sitting Grooming Details (indicate cue type and reason): on EOB Upper Body Bathing: Sitting;Set up Upper Body Bathing Details (indicate cue type and reason): on EOB Lower Body Bathing: Minimal assistance;Sit to/from stand   Upper Body Dressing : Sitting;Set up Upper Body Dressing Details (indicate cue type and reason): changed gown     Toilet Transfer: Contact guard assist;Rolling walker (2 wheels)             General ADL Comments: performed bathing and dressing seated on EOB due to patient having wet bed and gown    Extremity/Trunk Assessment              Vision       Perception     Praxis     Communication Communication Communication: No apparent difficulties   Cognition Arousal: Alert Behavior During  Therapy: WFL for tasks assessed/performed, Flat affect Cognition: No family/caregiver present to determine baseline             OT - Cognition Comments: alert and oriented x4                 Following commands: Intact         Cueing   Cueing Techniques: Verbal cues, Gestural cues  Exercises      Shoulder Instructions       General Comments SpO2 93-95 on RA BP 145/65 (85)    Pertinent Vitals/ Pain       Pain Assessment Pain Assessment: Faces Faces Pain Scale: Hurts a little bit Pain Location: generalized Pain Descriptors / Indicators: Grimacing Pain Intervention(s): Monitored during session, Repositioned  Home Living                                          Prior Functioning/Environment              Frequency  Min 2X/week        Progress Toward Goals  OT Goals(current goals can now be found in the care plan section)  Progress towards OT goals: Progressing toward goals  Acute Rehab OT Goals OT Goal Formulation: With patient Time For Goal Achievement: 05/19/24 Potential to Achieve Goals: Good ADL Goals Pt Will Perform Grooming: with modified independence;standing;sitting Pt Will Perform Lower Body Dressing: with modified independence;sit to/from stand;sitting/lateral leans Pt Will Transfer to Toilet: with modified independence;ambulating;bedside commode Pt Will Perform Toileting - Clothing Manipulation and hygiene: with modified independence;sit to/from stand;sitting/lateral leans Additional ADL Goal #1: Pt will verbalize and implement 3 or more ECS strategies for continued independence in ADL/IADL performance  Plan      Co-evaluation                 AM-PAC OT 6 Clicks Daily Activity     Outcome Measure   Help from another person eating meals?: None Help from another person taking care of personal grooming?: None Help from another person toileting, which includes using toliet, bedpan, or urinal?: A Little Help from another person bathing (including washing, rinsing, drying)?: A Little Help from another person to put on and taking off regular upper body clothing?: None Help from another person to put on and taking off regular lower body  clothing?: A Little 6 Click Score: 21    End of Session Equipment Utilized During Treatment: Gait belt;Rolling walker (2 wheels)  OT Visit Diagnosis: Muscle weakness (generalized) (M62.81);Unsteadiness on feet (R26.81);Other abnormalities of gait and mobility (R26.89)   Activity Tolerance Patient tolerated treatment well   Patient Left in chair;with call bell/phone within reach;with chair alarm set   Nurse Communication Mobility status        Time: 9276-9248 OT Time Calculation (min): 28 min  Charges: OT General Charges $OT Visit: 1 Visit OT Treatments $Self Care/Home Management : 23-37 mins  Dick [name], OTA Acute Rehabilitation Services  Office [phone]   Dean [name] 05/08/2024, 11:24 AM

## 2024-05-08 NOTE — Progress Notes (Signed)
 PROGRESS NOTE    Dean Croft Sr.  FMW:[id number] DOB: May 26, 1951 DOA: 05/03/2024 PCP: [name], MD   73/M w systolic CHF,  PAF, hyperlipidemia, CKD 3b and T2DM who presented with dyspnea. 08/07 diagnosed with urinary tract infection and placed on antibiotic therapy.  Okay in ED, VSS, ++ edema,  cr 2,72, BNP 2,633 , troponin 61 and 53  Urine analysis SG >1.030, protein > 300, large hgb and small leukocytes, > 50 wbc and 21-50 rbc  CXR -bilateral hilar vascular congestion, with fluid in the right fissure CHG Thank you -8/12 poor response to diuretics, low blood pressure, lactic acidosis Possible low output heart failure, >consulted heart failure team. 8/12, right heart cath with elevated filling pressures, cardiac index 2.1, Thermo CO 1.8 started on milrinone  and Lasix  drip - Persistent cough, 8/13 positive for metapneumovirus  Subjective: Feels a little better  Assessment and Plan:  Acute on chronic systolic CHF  Severe MR, mod to Severe TR ECHo 4/25w EF 30 to 35%, RV systolic function preserved, RVSP 69.3 mmHg,  severe mitral regurgitation, with akinetic posterior wall (ischemic MR), moderate to severe tricuspid valve regurgitation, mild to moderate aortic valve regurgitation. LV posterior wall and basal inferior segment akinetic.  - Repeat echo with EF 35-40%,, grade 2 DD, mildly reduced RV - RHC with biventricular failure, elevated filling pressure, low cardiac output, moderate pulmonary hypertension, prominent V waves suggesting significant MR - Limited response to diuretics, weight has overall trended up - Continue Imdur , hydralazine ,?  Restart diuretics today  URI, metapneumovirus - Contributing to his cough as well, now improving, supportive care  Congestive hepatopathy.  03/2024 live US  with cholelithiasis, and benign hepatic hemangiomas.  Improving  Paroxysmal atrial fibrillation (HCC) Continue with amiodarone , apixaban .   CAD (coronary artery disease) - last  ECHO in April w/ wall motion abnormality involving LV posterior wall and inferior basal segment - Single-vessel RCA disease noted on cath 2017, medical management - Statin on hold now  Chronic kidney disease, stage 3b (HCC) AKI, hyperkalemia. Hyponatremia.  -Worsening AKI, see discussion above  Type 2 diabetes mellitus  CBGs are stable  E. coli UTI Past urine culture positive for E coli sensitive for cephalosporins, sp 3 days of cephalexin  One bottle blood culture positive for staphylococcus epidermidis, suspected contaminant.  Repeat blood cultures negative thus far  Obesity, class 1 Calculated BMI is 31,4   DVT prophylaxis: Apixaban  Code Status: Full code Family Communication: none present Disposition Plan: Home with home health services, likely 48 hours  Consultants: CHF team   Procedures:   Antimicrobials:    Objective: Vitals:   05/07/24 2301 05/08/24 0415 05/08/24 0744 05/08/24 1137  BP: (!) 124/52 (!) 148/53 (!) 145/65 (!) 125/35  Pulse: 62 71 69   Resp: 20 17 20 17   Temp: 97.8 F (36.6 C) 97.9 F (36.6 C) 97.9 F (36.6 C) 97.8 F (36.6 C)  TempSrc: Oral Oral Oral Oral  SpO2: 92% 93%    Weight:  91.6 kg    Height:        Intake/Output Summary (Last 24 hours) at 05/08/2024 1144 Last data filed at 05/08/2024 0618 Gross per 24 hour  Intake 497.62 ml  Output 1600 ml  Net -1102.38 ml   Filed Weights   05/06/24 1510 05/07/24 0628 05/08/24 0415  Weight: 91.1 kg 91.7 kg 91.6 kg    Examination:  Gen: Awake, Alert, Oriented X 3,  HEENT: Positive JVD, right IJ central line Lungs: Poor air movement, few basilar rhonchi  CVS:  S1S2/RRR, systolic murmur Abd: soft, Non tender, non distended, BS present Extremities: no edema Skin: no new rashes on exposed skin    Data Reviewed:   CBC: Recent Labs  Lab 05/02/24 0545 05/03/24 2146 05/04/24 0113 05/04/24 0508 05/05/24 0447 05/06/24 1241 05/07/24 0554 05/08/24 0430  WBC 8.6 12.4* 12.3*  --  10.2   --  9.5 9.2  NEUTROABS 6.2  --  10.2*  --   --   --   --   --   HGB 11.1* 12.0* 10.9* 12.2* 10.8* 11.9*  12.2* 9.5* 9.9*  HCT 36.1* 39.7 36.3* 36.0* 34.2* 35.0*  36.0* 29.5* 30.3*  MCV 93.8 94.3 95.5  --  91.4  --  88.9 87.3  PLT 149* 121* 60*  --  52*  --  54* 67*   Basic Metabolic Panel: Recent Labs  Lab 05/04/24 0333 05/04/24 0508 05/05/24 0447 05/06/24 0548 05/06/24 1241 05/07/24 0554 05/08/24 0430  NA 139 137 134* 133* 138  136 133* 133*  K 6.6* 5.1 4.2 4.6 3.7  3.9 3.6 4.2  CL 102 103 100 100  --  99 103  CO2 21*  --  24 21*  --  25 25  GLUCOSE 65* 77 133* 114*  --  97 95  BUN 45* 45* 55* 66*  --  69* 74*  CREATININE 2.87* 2.80* 2.77* 2.83*  --  3.10* 3.09*  CALCIUM  9.8  --  8.8* 8.8*  --  8.8* 8.8*  MG 2.1  --  2.1 2.0  --  1.9 2.3  PHOS 5.1*  --   --   --   --   --   --    GFR: Estimated Creatinine Clearance: 22.2 mL/min (A) (by C-G formula based on SCr of 3.09 mg/dL (H)). Liver Function Tests: Recent Labs  Lab 05/02/24 0545 05/04/24 0333 05/05/24 0447 05/06/24 0929 05/07/24 0554  AST 23 3,966* 1,680* 696* 389*  ALT 23 2,269* 1,708* 1,188* 866*  ALKPHOS 65 89 82 81 74  BILITOT 1.2 3.8* 2.7* 2.3* 2.4*  PROT 6.7 7.1 6.0* 5.8* 5.4*  ALBUMIN  3.3* 3.6 3.0* 2.8* 2.5*   No results for input(s): LIPASE, AMYLASE in the last 168 hours. No results for input(s): AMMONIA in the last 168 hours. Coagulation Profile: No results for input(s): INR, PROTIME in the last 168 hours. Cardiac Enzymes: No results for input(s): CKTOTAL, CKMB, CKMBINDEX, TROPONINI in the last 168 hours. BNP (last 3 results) No results for input(s): PROBNP in the last 8760 hours. HbA1C: No results for input(s): HGBA1C in the last 72 hours.  CBG: Recent Labs  Lab 05/07/24 1206 05/07/24 1620 05/07/24 2054 05/08/24 0617 05/08/24 1140  GLUCAP 125* 128* 130* 111* 135*   Lipid Profile: No results for input(s): CHOL, HDL, LDLCALC, TRIG, CHOLHDL, LDLDIRECT  in the last 72 hours. Thyroid Function Tests: No results for input(s): TSH, T4TOTAL, FREET4, T3FREE, THYROIDAB in the last 72 hours. Anemia Panel: Recent Labs    05/07/24 1030  VITAMINB12 2,079*  FOLATE 9.4  FERRITIN 153  TIBC 155*  IRON 19*  RETICCTPCT 0.8   Urine analysis:    Component Value Date/Time   COLORURINE YELLOW 05/03/2024 2251   APPEARANCEUR HAZY (A) 05/03/2024 2251   LABSPEC >1.030 (H) 05/03/2024 2251   PHURINE 5.5 05/03/2024 2251   GLUCOSEU NEGATIVE 05/03/2024 2251   HGBUR LARGE (A) 05/03/2024 2251   BILIRUBINUR NEGATIVE 05/03/2024 2251   KETONESUR NEGATIVE 05/03/2024 2251   PROTEINUR >300 (A) 05/03/2024 2251   NITRITE NEGATIVE  05/03/2024 2251   LEUKOCYTESUR SMALL (A) 05/03/2024 2251   Sepsis Labs: @LABRCNTIP (procalcitonin:4,lacticidven:4)  ) Recent Results (from the past 240 hours)  Resp panel by RT-PCR (RSV, Flu A&B, Covid) Anterior Nasal Swab     Status: None   Collection Time: 05/02/24  6:23 AM   Specimen: Anterior Nasal Swab  Result Value Ref Range Status   SARS Coronavirus 2 by RT PCR NEGATIVE NEGATIVE Final   Influenza A by PCR NEGATIVE NEGATIVE Final   Influenza B by PCR NEGATIVE NEGATIVE Final    Comment: (NOTE) The Xpert Xpress SARS-CoV-2/FLU/RSV plus assay is intended as an aid in the diagnosis of influenza from Nasopharyngeal swab specimens and should not be used as a sole basis for treatment. Nasal washings and aspirates are unacceptable for Xpert Xpress SARS-CoV-2/FLU/RSV testing.  Fact Sheet for Patients: BloggerCourse.com  Fact Sheet for Healthcare Providers: SeriousBroker.it  This test is not yet approved or cleared by the [place]  FDA and has been authorized for detection and/or diagnosis of SARS-CoV-2 by FDA under an Emergency Use Authorization (EUA). This EUA will remain in effect (meaning this test can be used) for the duration of the COVID-19 declaration under  Section 564(b)(1) of the Act, 21 U.S.C. section 360bbb-3(b)(1), unless the authorization is terminated or revoked.     Resp Syncytial Virus by PCR NEGATIVE NEGATIVE Final    Comment: (NOTE) Fact Sheet for Patients: BloggerCourse.com  Fact Sheet for Healthcare Providers: SeriousBroker.it  This test is not yet approved or cleared by the [place]  FDA and has been authorized for detection and/or diagnosis of SARS-CoV-2 by FDA under an Emergency Use Authorization (EUA). This EUA will remain in effect (meaning this test can be used) for the duration of the COVID-19 declaration under Section 564(b)(1) of the Act, 21 U.S.C. section 360bbb-3(b)(1), unless the authorization is terminated or revoked.  Performed at [facility], [address] [place], [place] [address]   Urine Culture     Status: Abnormal   Collection Time: 05/02/24  8:10 AM   Specimen: Urine, Random  Result Value Ref Range Status   Specimen Description URINE, RANDOM  Final   Special Requests   Final    NONE Reflexed from [id number] Performed at [facility], [address], [place], [place] [address]    Culture MULTIPLE SPECIES PRESENT, SUGGEST RECOLLECTION (A)  Final   Report Status 05/03/2024 FINAL  Final  Culture, blood (Routine X 2) w Reflex to ID Panel     Status: None (Preliminary result)   Collection Time: 05/04/24  1:10 AM   Specimen: BLOOD  Result Value Ref Range Status   Specimen Description BLOOD LEFT ANTECUBITAL  Final   Special Requests   Final    BOTTLES DRAWN AEROBIC AND ANAEROBIC Blood Culture adequate volume   Culture   Final    NO GROWTH 4 DAYS Performed at [facility], [address], [place], [place] [address]    Report Status PENDING  Incomplete  Culture, blood (Routine X 2) w Reflex to ID Panel     Status: Abnormal   Collection Time: 05/04/24  3:32 AM   Specimen: BLOOD RIGHT HAND  Result Value Ref Range Status    Specimen Description BLOOD RIGHT HAND  Final   Special Requests   Final    BOTTLES DRAWN AEROBIC AND ANAEROBIC Blood Culture results may not be optimal due to an inadequate volume of blood received in culture bottles   Culture  Setup Time  Final    GRAM POSITIVE COCCI IN CLUSTERS ANAEROBIC BOTTLE ONLY CRITICAL RESULT CALLED TO, READ BACK BY AND VERIFIED WITH: PHARMD JESSICA MILLEN ON 05/05/24 @ 1846 BY DRT    Culture (A)  Final    STAPHYLOCOCCUS EPIDERMIDIS THE SIGNIFICANCE OF ISOLATING THIS ORGANISM FROM A SINGLE SET OF BLOOD CULTURES WHEN MULTIPLE SETS ARE DRAWN IS UNCERTAIN. PLEASE NOTIFY THE MICROBIOLOGY DEPARTMENT WITHIN ONE WEEK IF SPECIATION AND SENSITIVITIES ARE REQUIRED. Performed at [facility], [address], [place], [place] [address]    Report Status 05/07/2024 FINAL  Final  Blood Culture ID Panel (Reflexed)     Status: Abnormal   Collection Time: 05/04/24  3:32 AM  Result Value Ref Range Status   Enterococcus faecalis NOT DETECTED NOT DETECTED Final   Enterococcus Faecium NOT DETECTED NOT DETECTED Final   Listeria monocytogenes NOT DETECTED NOT DETECTED Final   Staphylococcus species DETECTED (A) NOT DETECTED Final    Comment: CRITICAL RESULT CALLED TO, READ BACK BY AND VERIFIED WITH: PHARMD JESSICA MILLEN ON 05/05/24 @ 1846 BY DRT    Staphylococcus aureus (BCID) NOT DETECTED NOT DETECTED Final   Staphylococcus epidermidis DETECTED (A) NOT DETECTED Final    Comment: Methicillin (oxacillin) resistant coagulase negative staphylococcus. Possible blood culture contaminant (unless isolated from more than one blood culture draw or clinical case suggests pathogenicity). No antibiotic treatment is indicated for blood  culture contaminants. CRITICAL RESULT CALLED TO, READ BACK BY AND VERIFIED WITH: PHARMD JESSICA MILLEN ON 05/05/24 @ 1846 BY DRT    Staphylococcus lugdunensis NOT DETECTED NOT DETECTED Final   Streptococcus species NOT DETECTED NOT DETECTED Final    Streptococcus agalactiae NOT DETECTED NOT DETECTED Final   Streptococcus pneumoniae NOT DETECTED NOT DETECTED Final   Streptococcus pyogenes NOT DETECTED NOT DETECTED Final   A.calcoaceticus-baumannii NOT DETECTED NOT DETECTED Final   Bacteroides fragilis NOT DETECTED NOT DETECTED Final   Enterobacterales NOT DETECTED NOT DETECTED Final   Enterobacter cloacae complex NOT DETECTED NOT DETECTED Final   Escherichia coli NOT DETECTED NOT DETECTED Final   Klebsiella aerogenes NOT DETECTED NOT DETECTED Final   Klebsiella oxytoca NOT DETECTED NOT DETECTED Final   Klebsiella pneumoniae NOT DETECTED NOT DETECTED Final   Proteus species NOT DETECTED NOT DETECTED Final   Salmonella species NOT DETECTED NOT DETECTED Final   Serratia marcescens NOT DETECTED NOT DETECTED Final   Haemophilus influenzae NOT DETECTED NOT DETECTED Final   Neisseria meningitidis NOT DETECTED NOT DETECTED Final   Pseudomonas aeruginosa NOT DETECTED NOT DETECTED Final   Stenotrophomonas maltophilia NOT DETECTED NOT DETECTED Final   Candida albicans NOT DETECTED NOT DETECTED Final   Candida auris NOT DETECTED NOT DETECTED Final   Candida glabrata NOT DETECTED NOT DETECTED Final   Candida krusei NOT DETECTED NOT DETECTED Final   Candida parapsilosis NOT DETECTED NOT DETECTED Final   Candida tropicalis NOT DETECTED NOT DETECTED Final   Cryptococcus neoformans/gattii NOT DETECTED NOT DETECTED Final   Methicillin resistance mecA/C DETECTED (A) NOT DETECTED Final    Comment: CRITICAL RESULT CALLED TO, READ BACK BY AND VERIFIED WITH: PHARMD JESSICA MILLEN ON 05/05/24 @ 1846 BY DRT Performed at [facility], [address], [place], [place] [address]   Culture, blood (Routine X 2) w Reflex to ID Panel     Status: None (Preliminary result)   Collection Time: 05/06/24  5:48 AM   Specimen: BLOOD  Result Value Ref Range Status   Specimen Description BLOOD BLOOD RIGHT HAND  Final  Special Requests   Final    BOTTLES DRAWN  AEROBIC AND ANAEROBIC Blood Culture adequate volume   Culture   Final    NO GROWTH 2 DAYS Performed at [facility], [address], [place], [place] [address]    Report Status PENDING  Incomplete  Culture, blood (Routine X 2) w Reflex to ID Panel     Status: None (Preliminary result)   Collection Time: 05/06/24  5:49 AM   Specimen: BLOOD  Result Value Ref Range Status   Specimen Description BLOOD BLOOD LEFT HAND  Final   Special Requests   Final    BOTTLES DRAWN AEROBIC ONLY Blood Culture adequate volume   Culture   Final    NO GROWTH 2 DAYS Performed at [facility], [address], [place], [place] [address]    Report Status PENDING  Incomplete  Respiratory (~20 pathogens) panel by PCR     Status: Abnormal   Collection Time: 05/07/24  7:58 AM   Specimen: Nasopharyngeal Swab; Respiratory  Result Value Ref Range Status   Adenovirus NOT DETECTED NOT DETECTED Final   Coronavirus 229E NOT DETECTED NOT DETECTED Final    Comment: (NOTE) The Coronavirus on the Respiratory Panel, DOES NOT test for the novel  Coronavirus (2019 nCoV)    Coronavirus HKU1 NOT DETECTED NOT DETECTED Final   Coronavirus NL63 NOT DETECTED NOT DETECTED Final   Coronavirus OC43 NOT DETECTED NOT DETECTED Final   Metapneumovirus DETECTED (A) NOT DETECTED Final   Rhinovirus / Enterovirus NOT DETECTED NOT DETECTED Final   Influenza A NOT DETECTED NOT DETECTED Final   Influenza B NOT DETECTED NOT DETECTED Final   Parainfluenza Virus 1 NOT DETECTED NOT DETECTED Final   Parainfluenza Virus 2 NOT DETECTED NOT DETECTED Final   Parainfluenza Virus 3 NOT DETECTED NOT DETECTED Final   Parainfluenza Virus 4 NOT DETECTED NOT DETECTED Final   Respiratory Syncytial Virus NOT DETECTED NOT DETECTED Final   Bordetella pertussis NOT DETECTED NOT DETECTED Final   Bordetella Parapertussis NOT DETECTED NOT DETECTED Final   Chlamydophila pneumoniae NOT DETECTED NOT DETECTED Final   Mycoplasma pneumoniae NOT DETECTED  NOT DETECTED Final    Comment: Performed at [facility], [address], [place], [place] [address]     Radiology Studies: ECHOCARDIOGRAM COMPLETE Result Date: 05/08/2024    ECHOCARDIOGRAM REPORT   Patient Name:   Dean Gaertner Sr. Date of Exam: 05/07/2024 Medical Rec #:  [id number]            Height:       66.0 in Accession #:    [id number]           Weight:       202.2 lb Date of Birth:  November 09, 1950             BSA:          2.009 m Patient Age:    73 years             BP:           105/36 mmHg Patient Gender: M                    HR:           65 bpm. Exam Location:  Inpatient Procedure: 2D Echo, Cardiac Doppler, Color Doppler and Intracardiac            Opacification Agent (Both Spectral and Color Flow Doppler were  utilized during procedure). Indications:    CHF  History:        Patient has prior history of Echocardiogram examinations, most                 recent 08/25/2022. Cardiomyopathy, CAD, Arrythmias:Atrial                 Fibrillation; Risk Factors:Hypertension, Sleep Apnea and                 Diabetes.  Sonographer:    Ellouise Mose RDCS Referring Phys: [id number] BENJAMIN J STONER IMPRESSIONS  1. Left ventricular ejection fraction, by estimation, is 35 to 40%. The left ventricle has moderately decreased function. The left ventricle demonstrates global hypokinesis. There is mild concentric left ventricular hypertrophy. Left ventricular diastolic parameters are consistent with Grade II diastolic dysfunction (pseudonormalization).  2. Right ventricular systolic function is mildly reduced. The right ventricular size is mildly enlarged. There is moderately elevated pulmonary artery systolic pressure. The estimated right ventricular systolic pressure is 58.4 mmHg.  3. Left atrial size was severely dilated.  4. Right atrial size was moderately dilated.  5. The posterior leaflet is restricted. The mitral valve is abnormal. Moderate to severe mitral valve regurgitation. No evidence of mitral  stenosis. Moderate mitral annular calcification.  6. Tricuspid valve regurgitation is moderate.  7. The aortic valve is bicuspid. There is moderate calcification of the aortic valve. Aortic valve regurgitation is mild. No aortic stenosis is present.  8. The inferior vena cava is dilated in size with >50% respiratory variability, suggesting right atrial pressure of 8 mmHg. FINDINGS  Left Ventricle: Left ventricular ejection fraction, by estimation, is 35 to 40%. The left ventricle has moderately decreased function. The left ventricle demonstrates global hypokinesis. Definity  contrast agent was given IV to delineate the left ventricular endocardial borders. The left ventricular internal cavity size was normal in size. There is mild concentric left ventricular hypertrophy. Left ventricular diastolic parameters are consistent with Grade II diastolic dysfunction (pseudonormalization).  LV Wall Scoring: The posterior wall is akinetic. The inferior wall is hypokinetic. Right Ventricle: The right ventricular size is mildly enlarged. No increase in right ventricular wall thickness. Right ventricular systolic function is mildly reduced. There is moderately elevated pulmonary artery systolic pressure. The tricuspid regurgitant velocity is 3.55 m/s, and with an assumed right atrial pressure of 8 mmHg, the estimated right ventricular systolic pressure is 58.4 mmHg. Left Atrium: Left atrial size was severely dilated. Right Atrium: Right atrial size was moderately dilated. Pericardium: There is no evidence of pericardial effusion. Mitral Valve: The posterior leaflet is restricted. The mitral valve is abnormal. There is mild calcification of the mitral valve leaflet(s). Moderate mitral annular calcification. Moderate to severe mitral valve regurgitation, with centrally-directed jet. No evidence of mitral valve stenosis. Tricuspid Valve: The tricuspid valve is normal in structure. Tricuspid valve regurgitation is moderate . No  evidence of tricuspid stenosis. Aortic Valve: The aortic valve is bicuspid. There is moderate calcification of the aortic valve. Aortic valve regurgitation is mild. No aortic stenosis is present. Aortic valve mean gradient measures 10.0 mmHg. Aortic valve peak gradient measures 17.8 mmHg. Aortic valve area, by VTI measures 2.96 cm. Pulmonic Valve: The pulmonic valve was normal in structure. Pulmonic valve regurgitation is not visualized. No evidence of pulmonic stenosis. Aorta: The aortic root is normal in size and structure. Venous: The inferior vena cava is dilated in size with greater than 50% respiratory variability, suggesting right atrial pressure of 8 mmHg. IAS/Shunts: No  atrial level shunt detected by color flow Doppler. Additional Comments: A device lead is visualized.  LEFT VENTRICLE PLAX 2D LVIDd:         5.70 cm   Diastology LVIDs:         4.80 cm   LV e' medial:    4.80 cm/s LV PW:         1.60 cm   LV E/e' medial:  30.4 LV IVS:        1.30 cm   LV e' lateral:   9.46 cm/s LVOT diam:     2.70 cm   LV E/e' lateral: 15.4 LV SV:         1517 LV SV Index:   755 LVOT Area:     5.73 cm  RIGHT VENTRICLE              IVC RV Basal diam:  4.58 cm      IVC diam: 2.30 cm RV Mid diam:    3.68 cm RV Length:      9.58 cm RV S prime:     148.00 cm/s LEFT ATRIUM           Index LA diam:      5.50 cm 2.74 cm/m LA Vol (A2C): 95.7 ml 47.64 ml/m LA Vol (A4C): 88.7 ml 44.15 ml/m  AORTIC VALVE AV Area (Vmax):    3.20 cm AV Area (VTI):     2.96 cm AV Vmax:           211.00 cm/s AV VTI:            5.130 m AV Peak Grad:      17.8 mmHg AV Mean Grad:      10.0 mmHg LVOT Vmax:         118.00 cm/s LVOT Vmean:        813.000 cm/s LVOT VTI:          2.650 m LVOT/AV VTI ratio: 0.52  AORTA Ao Root diam: 3.30 cm Ao Asc diam:  3.10 cm MITRAL VALVE                   TRICUSPID VALVE MV Area VTI: 3.19 cm          TR Peak grad:   50.4 mmHg MV VTI:      4.75 m            TR Vmax:        355.00 cm/s MR Peak grad:      94.9 mmHg MR Mean  grad:      53.0 mmHg   SHUNTS MR Vmax:           487.00 cm/s Systemic VTI:  2.65 m MR Vmean:          335.0 cm/s  Systemic Diam: 2.70 cm MR Vena Contracta: 0.60 cm MV E velocity: 146.00 cm/s MV A velocity: 105.00 cm/s MV E/A ratio:  1.39 [name] MD Electronically signed by [name] MD Signature Date/Time: 05/08/2024/6:14:56 AM    Final    DG Chest 2 View Result Date: 05/07/2024 CLINICAL DATA:  Cough. EXAM: CHEST - 2 VIEW COMPARISON:  05/06/2024 FINDINGS: Right-sided central line with tip projecting over the SVC. 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 Increasing peribronchial thickening in both lungs. There may be small pleural effusions. No pneumothorax. Stable cardiomegaly. Unchanged mediastinal contours, aortic atherosclerosis. IMPRESSION: 1. Increasing peribronchial thickening in both lungs, may be pulmonary edema or bronchitis. 2. Possible small pleural effusions.  3. Stable cardiomegaly. Electronically Signed   By: Andrea Gasman M.D.   On: 05/07/2024 13:36   DG CHEST PORT 1 VIEW Result Date: 05/06/2024 CLINICAL DATA:  Central line EXAM: PORTABLE CHEST 1 VIEW COMPARISON:  Chest x-ray 05/03/2024 FINDINGS: There is a new right-sided central venous catheter with distal tip projecting over the SVC. There is no pneumothorax. Left-sided pacemaker again seen. Heart is enlarged, unchanged. No focal lung consolidation, pleural effusion or acute fracture. IMPRESSION: New right-sided central venous catheter with distal tip projecting over the SVC. No pneumothorax. Electronically Signed   By: Greig Pique M.D.   On: 05/06/2024 20:55   CARDIAC CATHETERIZATION Result Date: 05/06/2024 Findings: RA = 15 RV = 63/13 PA = 64/27 (39) PCW = 25 (v waves to 40) Fick cardiac output/index = 4.1/2.1 Thermo CO/CI = 3.6/1.8 PVR = 3.4 (Fick) 3.8 (TD) Ao sat = 97% PA sat = 51%, 53% PAPi = 2.5 Assessment: 1. Biventricular HF with elevated filling pressures and low cardiac output 2. Mixed moderate pulmonary HTN 3.  Prominent v-waves in PCW tracing suggestive of significant MR Plan/Discussion: Start milrinone . Leave central line in to follow co-ox and CVP. Repeat echo. Diurese. Daniel Bensimhon, MD 4:41 PM    Scheduled Meds:  amiodarone   200 mg Oral Daily   apixaban   5 mg Oral BID   Chlorhexidine  Gluconate Cloth  6 each Topical Daily   ferrous sulfate   325 mg Oral Daily   furosemide   80 mg Oral Daily   hydrALAZINE   25 mg Oral Q8H   insulin  aspart  0-6 Units Subcutaneous TID WC   isosorbide  mononitrate  30 mg Oral Daily   polyethylene glycol  17 g Oral Daily   Continuous Infusions:  milrinone  0.125 mcg/kg/min (05/08/24 1110)     LOS: 4 days    Time spent:    Sigurd Pac, MD [facility]   05/08/2024, 11:44 AM

## 2024-05-08 NOTE — Progress Notes (Signed)
 Mobility Specialist Progress Note;   05/08/24 0857  Mobility  Activity Pivoted/transferred from chair to bed  Level of Assistance Standby assist, set-up cues, supervision of patient - no hands on  Assistive Device None  Distance Ambulated (ft) 5 ft  Activity Response Tolerated well  Mobility Referral Yes  Mobility visit 1 Mobility  Mobility Specialist Start Time (ACUTE ONLY) 0857  Mobility Specialist Stop Time (ACUTE ONLY) 0905  Mobility Specialist Time Calculation (min) (ACUTE ONLY) 8 min   Pt requesting assistance back to bed. Required no physical assistance, pt often reaching out for support on furniture in room. VSS on RA and no c/o when asked. Pt left comfortably in bed with all needs met, alarm on.   Lauraine Erm Mobility Specialist Please contact via SecureChat or [employer] [phone]

## 2024-05-08 NOTE — Plan of Care (Signed)

## 2024-05-08 NOTE — Progress Notes (Signed)
 Advanced Heart Failure Rounding Note  Cardiologist: Vinie JAYSON Maxcy, MD  Chief Complaint: Systolic Heart Failure   Patient Profile   73 y.o. male with history of chronic systolic CHF, CAD, VT/VF arrest s/p single chamber ICD, LBBB, PAF, MR, CKD IIIb, OSA on CPAP, DM II, HTN.    Admitted with acute on chronic systolic CHF>>cardiogenic shock. Also with concomitant metapneumovirus infection.   Subjective:   8/12- RHC - Elevated filling pressures. Fick CO 4.1/CI 2.1  Thermo CO 3.6/ CI 1.8. Started on milrinone  0.25 mcg + lasix  drip.  8/13: Echo EF 35-40%, RV mildly reduced, RVSP 58, Mod-severe MR w/ restricted posterior leaflet, mod TR   Diuretics held yesterday w/ low CVPs. Still w/ 2L in UOP. CVP today 9.   Remains on milrinone  0.25. Co-ox 75% today.   SCr stable, 3.10>>3.09   Overall says he feels much better today. Breathing much improved. No chest pain.     Objective:   Weight Range: 91.6 kg Body mass index is 32.59 kg/m.   Vital Signs:   Temp:  [97.6 F (36.4 C)-97.9 F (36.6 C)] 97.9 F (36.6 C) (08/14 0744) Pulse Rate:  [59-71] 69 (08/14 0744) Resp:  [17-20] 20 (08/14 0744) BP: (105-148)/(32-65) 145/65 (08/14 0744) SpO2:  [89 %-97 %] 93 % (08/14 0415) Weight:  [91.6 kg] 91.6 kg (08/14 0415) Last BM Date : 05/03/24  Weight change: Filed Weights   05/06/24 1510 05/07/24 0628 05/08/24 0415  Weight: 91.1 kg 91.7 kg 91.6 kg    Intake/Output:   Intake/Output Summary (Last 24 hours) at 05/08/2024 0937 Last data filed at 05/08/2024 0618 Gross per 24 hour  Intake 497.62 ml  Output 2000 ml  Net -1502.38 ml     Physical Exam   CVP 9 GENERAL: fatigued appearing, NAD Lungs- decreased BS at the base bilaterally  CARDIAC:  JVP: 9 cm RIJ CVC          Normal rate with regular rhythm. 3/6 MR murmur.  Trace b/l ankle edema.  ABDOMEN: Soft, non-tender, non-distended.  EXTREMITIES: Warm and well perfused.  NEUROLOGIC: No obvious FND  Telemetry   NSR 90s,  personally reviewed   EKG    N/A   Labs    CBC Recent Labs    05/07/24 0554 05/08/24 0430  WBC 9.5 9.2  HGB 9.5* 9.9*  HCT 29.5* 30.3*  MCV 88.9 87.3  PLT 54* 67*   Basic Metabolic Panel Recent Labs    91/86/74 0554 05/08/24 0430  NA 133* 133*  K 3.6 4.2  CL 99 103  CO2 25 25  GLUCOSE 97 95  BUN 69* 74*  CREATININE 3.10* 3.09*  CALCIUM  8.8* 8.8*  MG 1.9 2.3   Liver Function Tests Recent Labs    05/06/24 0929 05/07/24 0554  AST 696* 389*  ALT 1,188* 866*  ALKPHOS 81 74  BILITOT 2.3* 2.4*  PROT 5.8* 5.4*  ALBUMIN  2.8* 2.5*   No results for input(s): LIPASE, AMYLASE in the last 72 hours. Cardiac Enzymes No results for input(s): CKTOTAL, CKMB, CKMBINDEX, TROPONINI in the last 72 hours.  BNP: BNP (last 3 results) Recent Labs    04/20/24 1052 05/03/24 2146 05/04/24 0333  BNP 2,103.7* 2,633.5* 2,770.3*    ProBNP (last 3 results) No results for input(s): PROBNP in the last 8760 hours.   D-Dimer No results for input(s): DDIMER in the last 72 hours. Hemoglobin A1C No results for input(s): HGBA1C in the last 72 hours. Fasting Lipid Panel No results for  input(s): CHOL, HDL, LDLCALC, TRIG, CHOLHDL, LDLDIRECT in the last 72 hours. Thyroid Function Tests No results for input(s): TSH, T4TOTAL, T3FREE, THYROIDAB in the last 72 hours.  Invalid input(s): FREET3  Other results:   Imaging    DG Chest 2 View Result Date: 05/07/2024 CLINICAL DATA:  Cough. EXAM: CHEST - 2 VIEW COMPARISON:  05/06/2024 FINDINGS: Right-sided central line with tip projecting over the SVC. 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 Increasing peribronchial thickening in both lungs. There may be small pleural effusions. No pneumothorax. Stable cardiomegaly. Unchanged mediastinal contours, aortic atherosclerosis. IMPRESSION: 1. Increasing peribronchial thickening in both lungs, may be pulmonary edema or bronchitis. 2. Possible small pleural  effusions. 3. Stable cardiomegaly. Electronically Signed   By: Andrea Gasman M.D.   On: 05/07/2024 13:36     Medications:     Scheduled Medications:  amiodarone   200 mg Oral Daily   apixaban   5 mg Oral BID   Chlorhexidine  Gluconate Cloth  6 each Topical Daily   ferrous sulfate   325 mg Oral Daily   insulin  aspart  0-6 Units Subcutaneous TID WC   polyethylene glycol  17 g Oral Daily    Infusions:  milrinone  0.25 mcg/kg/min (05/08/24 0743)    PRN Medications: acetaminophen  **OR** acetaminophen , guaiFENesin -dextromethorphan , ipratropium-albuterol , melatonin, ondansetron  (ZOFRAN ) IV      Assessment/Plan   Acute on chronic systolic CHF>>>cardiogenic shock -Lactic acid initially > 6 w/ evidence of end organ dysfunction with elevated LFTs and AKI. One blood cuutlure positive for staph epidermidis, likely contaminant. Urine culture also contaminated. Treated for UTI, but suspect acute illness primarily d/t cardiogenic shock. -CM likely at least in part ischemic. Had single vessel RCA disease in 2017 treated medically. Also has LBBB with wide QRS ( 150 ms) and significant MR. -Echo 04/25: EF 30-35%, grade II Ddm, RV okay, RVSP 70 mmHg, severe ischemic MR, moderate to severe TR -NYHA III on admission. RHC with elevated filling pressures thermo CI 1.9.  Post cath diuresed with IV lasix  and started on milrinone .  -Echo this admit EF 35-40%, RV mildly reduced, mod-severe MR, mod TR -Remains on Milrinone  0.25. Co-ox 75%, CVP 9  -Start PO Lasix  80 mg daily  -Reduce Milrinone  to 0.125 and follow co-ox  -If SCr improves, plan SGLT2i tomorrow -start Hydral 25 tid + Imdur  30 mg daily for afterload reduction  -additional GDMT limited by renal fx   MR/TR - mod-severe MR w/ restricted posterior leaflet on echo - mod TR - needs TEE but will wait to assess as outpatient, once recovered from acute respiratory virus  - continue HF optimization per above    PAF - Previously diagnosed on  device - SR this admission. No AF on device check 08/04. - Continue amio 200 mg daily  - Continue Eliquis  5 mg twice a day.     Hx VT/VF - Has single chamber ICD - Continue amiodarone  200 mg daily    Elevated LFTs - AST peaked ~ 4,000, ALT peaked ~ 2200, T bili 3.8 -Likley shock liver - Now trending down. Continue to follow      AKI on CKD IIIb - Suspect ATN in setting of shock - Scr 2.8 >3.1>>3.0 today  - CVP back up today. Restart PO diuretics - CO supported w/ milrinone . Will need to monitor w/ inotrope wean    Possible UTI - Abx per primary team - Hold off on addition of SGLT2i this admit    CAD -Single vessel RCA disease on cath in 2017. Managed medically -Not  on aspirin  with need for anticoagulation -No chest pain.   -Statin has been held. LFTs trending down.   IDA  - Hgb drifting down. No obvious source. - Tsat 12%. Needs IV Fe   Metapneumovirus  - supportive care     Length of Stay: [address] [place] RIGGERS  05/08/2024, 9:37 AM  Advanced Heart Failure Team Pager [phone] (M-F; 7a - 5p)  Please contact [facility] for night-coverage after hours (5p -7a ) and weekends on [email or web address]

## 2024-05-09 DIAGNOSIS — R570 Cardiogenic shock: Secondary | ICD-10-CM | POA: Diagnosis not present

## 2024-05-09 DIAGNOSIS — I5023 Acute on chronic systolic (congestive) heart failure: Secondary | ICD-10-CM | POA: Diagnosis not present

## 2024-05-09 LAB — CBC
HCT: 30.8 % — ABNORMAL LOW (ref 39.0–52.0)
Hemoglobin: 9.9 g/dL — ABNORMAL LOW (ref 13.0–17.0)
MCH: 28.1 pg (ref 26.0–34.0)
MCHC: 32.1 g/dL (ref 30.0–36.0)
MCV: 87.5 fL (ref 80.0–100.0)
Platelets: 75 K/uL — ABNORMAL LOW (ref 150–400)
RBC: 3.52 MIL/uL — ABNORMAL LOW (ref 4.22–5.81)
RDW: 16.1 % — ABNORMAL HIGH (ref 11.5–15.5)
WBC: 9.5 K/uL (ref 4.0–10.5)
nRBC: 0 % (ref 0.0–0.2)

## 2024-05-09 LAB — MAGNESIUM: Magnesium: 2.4 mg/dL (ref 1.7–2.4)

## 2024-05-09 LAB — BASIC METABOLIC PANEL WITH GFR
Anion gap: 6 (ref 5–15)
BUN: 72 mg/dL — ABNORMAL HIGH (ref 8–23)
CO2: 25 mmol/L (ref 22–32)
Calcium: 8.8 mg/dL — ABNORMAL LOW (ref 8.9–10.3)
Chloride: 102 mmol/L (ref 98–111)
Creatinine, Ser: 2.48 mg/dL — ABNORMAL HIGH (ref 0.61–1.24)
GFR, Estimated: 27 mL/min — ABNORMAL LOW (ref >60)
Glucose, Bld: 125 mg/dL — ABNORMAL HIGH (ref 70–99)
Potassium: 4.1 mmol/L (ref 3.5–5.1)
Sodium: 133 mmol/L — ABNORMAL LOW (ref 135–145)

## 2024-05-09 LAB — COOXEMETRY PANEL
Carboxyhemoglobin: 2 % — ABNORMAL HIGH (ref 0.5–1.5)
Methemoglobin: <0.7 % (ref 0.0–1.5)
O2 Saturation: 69 %
Total hemoglobin: 10.2 g/dL — ABNORMAL LOW (ref 12.0–16.0)

## 2024-05-09 LAB — GLUCOSE, CAPILLARY
Glucose-Capillary: 130 mg/dL — ABNORMAL HIGH (ref 70–99)
Glucose-Capillary: 96 mg/dL (ref 70–99)
Glucose-Capillary: 114 mg/dL — ABNORMAL HIGH (ref 70–99)
Glucose-Capillary: 126 mg/dL — ABNORMAL HIGH (ref 70–99)

## 2024-05-09 LAB — CULTURE, BLOOD (ROUTINE X 2)
Culture: NO GROWTH
Special Requests: ADEQUATE

## 2024-05-09 MED ORDER — ATORVASTATIN CALCIUM 40 MG PO TABS
40.0000 mg | ORAL_TABLET | Freq: Every day | ORAL | Status: DC
  Administered 2024-05-09 – 2024-05-10 (×2): 40 mg via ORAL
  Filled 2024-05-09 (×2): qty 1

## 2024-05-09 MED ORDER — FUROSEMIDE 40 MG PO TABS
40.0000 mg | ORAL_TABLET | Freq: Every day | ORAL | Status: DC
  Administered 2024-05-09 – 2024-05-10 (×2): 40 mg via ORAL
  Filled 2024-05-09 (×2): qty 1

## 2024-05-09 NOTE — Progress Notes (Addendum)
 PROGRESS NOTE    Dean Croft Sr.  FMW:[id number] DOB: 09/23/1951 DOA: 05/03/2024 PCP: [name], MD   73/M w systolic CHF,  PAF, hyperlipidemia, CKD 3b and T2DM who presented with dyspnea. 08/07 diagnosed with urinary tract infection and placed on antibiotic therapy.  Okay in ED, VSS, ++ edema,  cr 2,72, BNP 2,633 , troponin 61 and 53  Urine analysis SG >1.030, protein > 300, large hgb and small leukocytes, > 50 wbc and 21-50 rbc  CXR -bilateral hilar vascular congestion, with fluid in the right fissure CHG Thank you -8/12 poor response to diuretics, low blood pressure, lactic acidosis Possible low output heart failure, >consulted heart failure team. 8/12, right heart cath with elevated filling pressures, cardiac index 2.1, Thermo CO 1.8 started on milrinone  and Lasix  drip - Persistent cough, 8/13 positive for metapneumovirus - 8/14, restarted Lasix , milrinone  wean  Subjective: -Feels better overall, breathing continues to improve  Assessment and Plan:  Acute on chronic systolic CHF  Severe MR, mod to Severe TR - Repeat echo with EF 35-40%,, grade 2 DD, mildly reduced RV, mitral regurgitation - RHC with biventricular failure, elevated filling pressure, low cardiac output, moderate pulmonary hypertension, prominent V waves suggesting significant MR - Volume status improving, restarted diuretics yesterday, milrinone  weaned - Continue Imdur  and hydralazine  - Increase activity, discharge planning  URI, metapneumovirus - Contributing to his cough as well, now improving, supportive care  Congestive hepatopathy.  03/2024 live US  with cholelithiasis, and benign hepatic hemangiomas.  Improving  Paroxysmal atrial fibrillation (HCC) Continue with amiodarone , apixaban .   CAD (coronary artery disease) - last ECHO in April w/ wall motion abnormality involving LV posterior wall and inferior basal segment - Single-vessel RCA disease noted on cath 2017, medical management - Statin on  hold now  Chronic kidney disease, stage 3b (HCC) AKI, hyperkalemia. Hyponatremia.  -Worsening AKI, see discussion above  Type 2 diabetes mellitus  CBGs are stable  E. coli UTI Past urine culture positive for E coli sensitive for cephalosporins, sp 3 days of cephalexin  One bottle blood culture positive for staphylococcus epidermidis, suspected contaminant.  Repeat blood cultures negative thus far  Obesity, class 1 Calculated BMI is 31,4   DVT prophylaxis: Apixaban  Code Status: Full code Family Communication: none present Disposition Plan: Home tomorrow if stable Consultants: CHF team   Procedures:   Antimicrobials:    Objective: Vitals:   05/09/24 0016 05/09/24 0454 05/09/24 0503 05/09/24 0827  BP: (!) 124/54 (!) 136/52  (!) 125/35  Pulse: 68 69  72  Resp: 16 16  (!) 21  Temp: 98 F (36.7 C) 98 F (36.7 C)  98 F (36.7 C)  TempSrc: Oral Oral  Oral  SpO2: 95% 93%  93%  Weight:   89.8 kg   Height:        Intake/Output Summary (Last 24 hours) at 05/09/2024 1138 Last data filed at 05/09/2024 0900 Gross per 24 hour  Intake 720 ml  Output 2925 ml  Net -2205 ml   Filed Weights   05/07/24 0628 05/08/24 0415 05/09/24 0503  Weight: 91.7 kg 91.6 kg 89.8 kg    Examination:  Gen: Awake, Alert, Oriented X 3,  HEENT: Positive JVD, right IJ central line Lungs: Scattered rhonchi CVS: S1S2/RRR, systolic murmur Abd: soft, Non tender, non distended, BS present Extremities: no edema Skin: no new rashes on exposed skin    Data Reviewed:   CBC: Recent Labs  Lab 05/04/24 0113 05/04/24 0508 05/05/24 0447 05/06/24 1241 05/07/24 0554 05/08/24 0430  05/09/24 0500  WBC 12.3*  --  10.2  --  9.5 9.2 9.5  NEUTROABS 10.2*  --   --   --   --   --   --   HGB 10.9*   < > 10.8* 11.9*  12.2* 9.5* 9.9* 9.9*  HCT 36.3*   < > 34.2* 35.0*  36.0* 29.5* 30.3* 30.8*  MCV 95.5  --  91.4  --  88.9 87.3 87.5  PLT 60*  --  52*  --  54* 67* 75*   < > = values in this interval not  displayed.   Basic Metabolic Panel: Recent Labs  Lab 05/04/24 0333 05/04/24 0508 05/05/24 0447 05/06/24 0548 05/06/24 1241 05/07/24 0554 05/08/24 0430 05/09/24 0500  NA 139   < > 134* 133* 138  136 133* 133* 133*  K 6.6*   < > 4.2 4.6 3.7  3.9 3.6 4.2 4.1  CL 102   < > 100 100  --  99 103 102  CO2 21*  --  24 21*  --  25 25 25   GLUCOSE 65*   < > 133* 114*  --  97 95 125*  BUN 45*   < > 55* 66*  --  69* 74* 72*  CREATININE 2.87*   < > 2.77* 2.83*  --  3.10* 3.09* 2.48*  CALCIUM  9.8  --  8.8* 8.8*  --  8.8* 8.8* 8.8*  MG 2.1  --  2.1 2.0  --  1.9 2.3 2.4  PHOS 5.1*  --   --   --   --   --   --   --    < > = values in this interval not displayed.   GFR: Estimated Creatinine Clearance: 27.4 mL/min (A) (by C-G formula based on SCr of 2.48 mg/dL (H)). Liver Function Tests: Recent Labs  Lab 05/04/24 0333 05/05/24 0447 05/06/24 0929 05/07/24 0554  AST 3,966* 1,680* 696* 389*  ALT 2,269* 1,708* 1,188* 866*  ALKPHOS 89 82 81 74  BILITOT 3.8* 2.7* 2.3* 2.4*  PROT 7.1 6.0* 5.8* 5.4*  ALBUMIN  3.6 3.0* 2.8* 2.5*   No results for input(s): LIPASE, AMYLASE in the last 168 hours. No results for input(s): AMMONIA in the last 168 hours. Coagulation Profile: No results for input(s): INR, PROTIME in the last 168 hours. Cardiac Enzymes: No results for input(s): CKTOTAL, CKMB, CKMBINDEX, TROPONINI in the last 168 hours. BNP (last 3 results) No results for input(s): PROBNP in the last 8760 hours. HbA1C: No results for input(s): HGBA1C in the last 72 hours.  CBG: Recent Labs  Lab 05/08/24 1140 05/08/24 1710 05/08/24 2155 05/09/24 0555 05/09/24 1117  GLUCAP 135* 139* 121* 130* 96   Lipid Profile: No results for input(s): CHOL, HDL, LDLCALC, TRIG, CHOLHDL, LDLDIRECT in the last 72 hours. Thyroid Function Tests: No results for input(s): TSH, T4TOTAL, FREET4, T3FREE, THYROIDAB in the last 72 hours. Anemia Panel: Recent Labs     05/07/24 1030  VITAMINB12 2,079*  FOLATE 9.4  FERRITIN 153  TIBC 155*  IRON 19*  RETICCTPCT 0.8   Urine analysis:    Component Value Date/Time   COLORURINE YELLOW 05/03/2024 2251   APPEARANCEUR HAZY (A) 05/03/2024 2251   LABSPEC >1.030 (H) 05/03/2024 2251   PHURINE 5.5 05/03/2024 2251   GLUCOSEU NEGATIVE 05/03/2024 2251   HGBUR LARGE (A) 05/03/2024 2251   BILIRUBINUR NEGATIVE 05/03/2024 2251   KETONESUR NEGATIVE 05/03/2024 2251   PROTEINUR >300 (A) 05/03/2024 2251   NITRITE NEGATIVE 05/03/2024  2251   LEUKOCYTESUR SMALL (A) 05/03/2024 2251   Sepsis Labs: @LABRCNTIP (procalcitonin:4,lacticidven:4)  ) Recent Results (from the past 240 hours)  Resp panel by RT-PCR (RSV, Flu A&B, Covid) Anterior Nasal Swab     Status: None   Collection Time: 05/02/24  6:23 AM   Specimen: Anterior Nasal Swab  Result Value Ref Range Status   SARS Coronavirus 2 by RT PCR NEGATIVE NEGATIVE Final   Influenza A by PCR NEGATIVE NEGATIVE Final   Influenza B by PCR NEGATIVE NEGATIVE Final    Comment: (NOTE) The Xpert Xpress SARS-CoV-2/FLU/RSV plus assay is intended as an aid in the diagnosis of influenza from Nasopharyngeal swab specimens and should not be used as a sole basis for treatment. Nasal washings and aspirates are unacceptable for Xpert Xpress SARS-CoV-2/FLU/RSV testing.  Fact Sheet for Patients: BloggerCourse.com  Fact Sheet for Healthcare Providers: SeriousBroker.it  This test is not yet approved or cleared by the [place]  FDA and has been authorized for detection and/or diagnosis of SARS-CoV-2 by FDA under an Emergency Use Authorization (EUA). This EUA will remain in effect (meaning this test can be used) for the duration of the COVID-19 declaration under Section 564(b)(1) of the Act, 21 U.S.C. section 360bbb-3(b)(1), unless the authorization is terminated or revoked.     Resp Syncytial Virus by PCR NEGATIVE NEGATIVE Final     Comment: (NOTE) Fact Sheet for Patients: BloggerCourse.com  Fact Sheet for Healthcare Providers: SeriousBroker.it  This test is not yet approved or cleared by the [place]  FDA and has been authorized for detection and/or diagnosis of SARS-CoV-2 by FDA under an Emergency Use Authorization (EUA). This EUA will remain in effect (meaning this test can be used) for the duration of the COVID-19 declaration under Section 564(b)(1) of the Act, 21 U.S.C. section 360bbb-3(b)(1), unless the authorization is terminated or revoked.  Performed at [facility], [address], [place], [place] [address]   Urine Culture     Status: Abnormal   Collection Time: 05/02/24  8:10 AM   Specimen: Urine, Random  Result Value Ref Range Status   Specimen Description URINE, RANDOM  Final   Special Requests   Final    NONE Reflexed from [id number] Performed at [facility], [address] [place], [place] [address]    Culture MULTIPLE SPECIES PRESENT, SUGGEST RECOLLECTION (A)  Final   Report Status 05/03/2024 FINAL  Final  Culture, blood (Routine X 2) w Reflex to ID Panel     Status: None   Collection Time: 05/04/24  1:10 AM   Specimen: BLOOD  Result Value Ref Range Status   Specimen Description BLOOD LEFT ANTECUBITAL  Final   Special Requests   Final    BOTTLES DRAWN AEROBIC AND ANAEROBIC Blood Culture adequate volume   Culture   Final    NO GROWTH 5 DAYS Performed at [facility], [address], [place], [place] [address]    Report Status 05/09/2024 FINAL  Final  Culture, blood (Routine X 2) w Reflex to ID Panel     Status: Abnormal   Collection Time: 05/04/24  3:32 AM   Specimen: BLOOD RIGHT HAND  Result Value Ref Range Status   Specimen Description BLOOD RIGHT HAND  Final   Special Requests   Final    BOTTLES DRAWN AEROBIC AND ANAEROBIC Blood Culture results may not be optimal due to an inadequate volume of blood received  in culture bottles   Culture  Setup Time   Final  GRAM POSITIVE COCCI IN CLUSTERS ANAEROBIC BOTTLE ONLY CRITICAL RESULT CALLED TO, READ BACK BY AND VERIFIED WITH: PHARMD JESSICA MILLEN ON 05/05/24 @ 1846 BY DRT    Culture (A)  Final    STAPHYLOCOCCUS EPIDERMIDIS THE SIGNIFICANCE OF ISOLATING THIS ORGANISM FROM A SINGLE SET OF BLOOD CULTURES WHEN MULTIPLE SETS ARE DRAWN IS UNCERTAIN. PLEASE NOTIFY THE MICROBIOLOGY DEPARTMENT WITHIN ONE WEEK IF SPECIATION AND SENSITIVITIES ARE REQUIRED. Performed at [facility], [address] [place], [place] [address]    Report Status 05/07/2024 FINAL  Final  Blood Culture ID Panel (Reflexed)     Status: Abnormal   Collection Time: 05/04/24  3:32 AM  Result Value Ref Range Status   Enterococcus faecalis NOT DETECTED NOT DETECTED Final   Enterococcus Faecium NOT DETECTED NOT DETECTED Final   Listeria monocytogenes NOT DETECTED NOT DETECTED Final   Staphylococcus species DETECTED (A) NOT DETECTED Final    Comment: CRITICAL RESULT CALLED TO, READ BACK BY AND VERIFIED WITH: PHARMD JESSICA MILLEN ON 05/05/24 @ 1846 BY DRT    Staphylococcus aureus (BCID) NOT DETECTED NOT DETECTED Final   Staphylococcus epidermidis DETECTED (A) NOT DETECTED Final    Comment: Methicillin (oxacillin) resistant coagulase negative staphylococcus. Possible blood culture contaminant (unless isolated from more than one blood culture draw or clinical case suggests pathogenicity). No antibiotic treatment is indicated for blood  culture contaminants. CRITICAL RESULT CALLED TO, READ BACK BY AND VERIFIED WITH: PHARMD JESSICA MILLEN ON 05/05/24 @ 1846 BY DRT    Staphylococcus lugdunensis NOT DETECTED NOT DETECTED Final   Streptococcus species NOT DETECTED NOT DETECTED Final   Streptococcus agalactiae NOT DETECTED NOT DETECTED Final   Streptococcus pneumoniae NOT DETECTED NOT DETECTED Final   Streptococcus pyogenes NOT DETECTED NOT DETECTED Final   A.calcoaceticus-baumannii NOT  DETECTED NOT DETECTED Final   Bacteroides fragilis NOT DETECTED NOT DETECTED Final   Enterobacterales NOT DETECTED NOT DETECTED Final   Enterobacter cloacae complex NOT DETECTED NOT DETECTED Final   Escherichia coli NOT DETECTED NOT DETECTED Final   Klebsiella aerogenes NOT DETECTED NOT DETECTED Final   Klebsiella oxytoca NOT DETECTED NOT DETECTED Final   Klebsiella pneumoniae NOT DETECTED NOT DETECTED Final   Proteus species NOT DETECTED NOT DETECTED Final   Salmonella species NOT DETECTED NOT DETECTED Final   Serratia marcescens NOT DETECTED NOT DETECTED Final   Haemophilus influenzae NOT DETECTED NOT DETECTED Final   Neisseria meningitidis NOT DETECTED NOT DETECTED Final   Pseudomonas aeruginosa NOT DETECTED NOT DETECTED Final   Stenotrophomonas maltophilia NOT DETECTED NOT DETECTED Final   Candida albicans NOT DETECTED NOT DETECTED Final   Candida auris NOT DETECTED NOT DETECTED Final   Candida glabrata NOT DETECTED NOT DETECTED Final   Candida krusei NOT DETECTED NOT DETECTED Final   Candida parapsilosis NOT DETECTED NOT DETECTED Final   Candida tropicalis NOT DETECTED NOT DETECTED Final   Cryptococcus neoformans/gattii NOT DETECTED NOT DETECTED Final   Methicillin resistance mecA/C DETECTED (A) NOT DETECTED Final    Comment: CRITICAL RESULT CALLED TO, READ BACK BY AND VERIFIED WITH: PHARMD JESSICA MILLEN ON 05/05/24 @ 1846 BY DRT Performed at [facility], [address], [place], [place] [address]   Culture, blood (Routine X 2) w Reflex to ID Panel     Status: None (Preliminary result)   Collection Time: 05/06/24  5:48 AM   Specimen: BLOOD  Result Value Ref Range Status   Specimen Description BLOOD BLOOD RIGHT HAND  Final   Special Requests  Final    BOTTLES DRAWN AEROBIC AND ANAEROBIC Blood Culture adequate volume   Culture   Final    NO GROWTH 3 DAYS Performed at [facility], [address] [place], [place] [address]    Report Status PENDING  Incomplete   Culture, blood (Routine X 2) w Reflex to ID Panel     Status: None (Preliminary result)   Collection Time: 05/06/24  5:49 AM   Specimen: BLOOD  Result Value Ref Range Status   Specimen Description BLOOD BLOOD LEFT HAND  Final   Special Requests   Final    BOTTLES DRAWN AEROBIC ONLY Blood Culture adequate volume   Culture   Final    NO GROWTH 3 DAYS Performed at [facility], [address], [place], [place] [address]    Report Status PENDING  Incomplete  Respiratory (~20 pathogens) panel by PCR     Status: Abnormal   Collection Time: 05/07/24  7:58 AM   Specimen: Nasopharyngeal Swab; Respiratory  Result Value Ref Range Status   Adenovirus NOT DETECTED NOT DETECTED Final   Coronavirus 229E NOT DETECTED NOT DETECTED Final    Comment: (NOTE) The Coronavirus on the Respiratory Panel, DOES NOT test for the novel  Coronavirus (2019 nCoV)    Coronavirus HKU1 NOT DETECTED NOT DETECTED Final   Coronavirus NL63 NOT DETECTED NOT DETECTED Final   Coronavirus OC43 NOT DETECTED NOT DETECTED Final   Metapneumovirus DETECTED (A) NOT DETECTED Final   Rhinovirus / Enterovirus NOT DETECTED NOT DETECTED Final   Influenza A NOT DETECTED NOT DETECTED Final   Influenza B NOT DETECTED NOT DETECTED Final   Parainfluenza Virus 1 NOT DETECTED NOT DETECTED Final   Parainfluenza Virus 2 NOT DETECTED NOT DETECTED Final   Parainfluenza Virus 3 NOT DETECTED NOT DETECTED Final   Parainfluenza Virus 4 NOT DETECTED NOT DETECTED Final   Respiratory Syncytial Virus NOT DETECTED NOT DETECTED Final   Bordetella pertussis NOT DETECTED NOT DETECTED Final   Bordetella Parapertussis NOT DETECTED NOT DETECTED Final   Chlamydophila pneumoniae NOT DETECTED NOT DETECTED Final   Mycoplasma pneumoniae NOT DETECTED NOT DETECTED Final    Comment: Performed at [facility], [address], [place], [place] [address]     Radiology Studies: No results found.    Scheduled Meds:  amiodarone   200 mg Oral Daily    apixaban   5 mg Oral BID   atorvastatin   40 mg Oral Daily   Chlorhexidine  Gluconate Cloth  6 each Topical Daily   ferrous sulfate   325 mg Oral Daily   furosemide   40 mg Oral Daily   hydrALAZINE   25 mg Oral Q8H   insulin  aspart  0-6 Units Subcutaneous TID WC   isosorbide  mononitrate  30 mg Oral Daily   polyethylene glycol  17 g Oral Daily   Continuous Infusions:     LOS: 5 days    Time spent:    Sigurd Pac, MD [facility]   05/09/2024, 11:38 AM

## 2024-05-09 NOTE — TOC Progression Note (Addendum)
 Transition of Care (TOC) - Progression Note    Patient Details  Name: Dean Minchey Sr. MRN: [id number] Date of Birth: 19-Oct-1950  Transition of Care [facility]) CM/SW Contact  Justina Delcia Czar, RN Phone Number: [phone] 05/09/2024, 9:47 AM  Clinical Narrative:     Spoke to pt and states he would like Rollator. Contacted [facility] [employer] for home. Pt's HH arranged with [facility]. Will need HHRN and HHPT orders with F2F.  Pt states he has scale at home and weighs daily. Pt reports having Living Better with HF booklet and other material at home on HF. States he tries to adhere to a low sodium/heart healthy diet.  Dtr will provide transportation home.   Scheduled hospital follow up with PCP on 05/20/2025 at 1130 am. Office requesting dc summary be faxed to their office. Handoff left for W/e ICM.  Expected Discharge Plan: Home/Self Care Barriers to Discharge: No Barriers Identified    Expected Discharge Plan and Services In-house Referral: NA Discharge Planning Services: CM Consult Post Acute Care Choice: Home Health Living arrangements for the past 2 months: Single Family Home                 DME Arranged: Walker rolling with seat DME Agency: [facility]       HH Arranged: PT, OT HH Agency: [facility] Date HH Agency Contacted: 05/05/24 Time HH Agency Contacted: 1633 Representative spoke with at [facility] Agency: Amy   Social Drivers of Health (SDOH) Interventions SDOH Screenings   Food Insecurity: No Food Insecurity (05/04/2024)  Housing: Low Risk  (05/04/2024)  Transportation Needs: No Transportation Needs (05/04/2024)  Utilities: Not At Risk (05/04/2024)  Social Connections: Moderately Integrated (05/04/2024)  Recent Concern: Social Connections - Moderately Isolated (04/20/2024)  Tobacco Use: Low Risk  (05/04/2024)    Readmission Risk Interventions    04/22/2024   11:11 AM  Readmission Risk Prevention Plan  Transportation Screening Complete   PCP or Specialist Appt within 5-7 Days Complete  Home Care Screening Complete  Medication Review (RN CM) Complete

## 2024-05-09 NOTE — Progress Notes (Signed)
 Advanced Heart Failure Rounding Note  Cardiologist: Vinie JAYSON Maxcy, MD  Chief Complaint: Systolic Heart Failure   Patient Profile   73 y.o. male with history of chronic systolic CHF, CAD, VT/VF arrest s/p single chamber ICD, LBBB, PAF, MR, CKD IIIb, OSA on CPAP, DM II, HTN.    Admitted with acute on chronic systolic CHF>>cardiogenic shock. Also with concomitant metapneumovirus infection.   Subjective:   8/12- RHC - Elevated filling pressures. Fick CO 4.1/CI 2.1  Thermo CO 3.6/ CI 1.8. Started on milrinone  0.25 mcg + lasix  drip.  8/13: Echo EF 35-40%, RV mildly reduced, RVSP 58, Mod-severe MR w/ restricted posterior leaflet, mod TR   Patient overall doing well, breathing better, and has been ambulating around the room. He reports he is urinating frequently, and is recovering from his UTI well.  Creatinine continues to improve this morning, though BUN lagging. Will defer SGLT2 given recent UTI, but can reduce Lasix  dose to 40mg  daily. Tolerating milrinone  wean well, will turn off this morning, repeat coox this afternoon. If overall doing well can hopefully discharge then.    Objective:   Weight Range: 89.8 kg Body mass index is 31.96 kg/m.   Vital Signs:   Temp:  [97.7 F (36.5 C)-98 F (36.7 C)] 97.8 F (36.6 C) (08/15 1552) Pulse Rate:  [63-72] 67 (08/15 1552) Resp:  [16-21] 21 (08/15 0827) BP: (121-136)/(35-54) 121/41 (08/15 1552) SpO2:  [93 %-95 %] 95 % (08/15 1552) Weight:  [89.8 kg] 89.8 kg (08/15 0503) Last BM Date : 05/09/24  Weight change: Filed Weights   05/07/24 0628 05/08/24 0415 05/09/24 0503  Weight: 91.7 kg 91.6 kg 89.8 kg    Intake/Output:   Intake/Output Summary (Last 24 hours) at 05/09/2024 1636 Last data filed at 05/09/2024 0900 Gross per 24 hour  Intake 720 ml  Output 2925 ml  Net -2205 ml     Physical Exam   CVP 3-4 GENERAL:NAD Lungs- normal WOB CARDIAC:  JVP:not seen RIJ CVC          Normal rate with regular rhythm. 3/6 MR murmur.   Trace b/l ankle edema.  ABDOMEN: Soft, non-tender, non-distended.  EXTREMITIES: Warm and well perfused.  NEUROLOGIC: No obvious FND  Telemetry   NSR 90s, personally reviewed   EKG    N/A   Labs    CBC Recent Labs    05/08/24 0430 05/09/24 0500  WBC 9.2 9.5  HGB 9.9* 9.9*  HCT 30.3* 30.8*  MCV 87.3 87.5  PLT 67* 75*   Basic Metabolic Panel Recent Labs    91/85/74 0430 05/09/24 0500  NA 133* 133*  K 4.2 4.1  CL 103 102  CO2 25 25  GLUCOSE 95 125*  BUN 74* 72*  CREATININE 3.09* 2.48*  CALCIUM  8.8* 8.8*  MG 2.3 2.4   Liver Function Tests Recent Labs    05/07/24 0554  AST 389*  ALT 866*  ALKPHOS 74  BILITOT 2.4*  PROT 5.4*  ALBUMIN  2.5*   No results for input(s): LIPASE, AMYLASE in the last 72 hours. Cardiac Enzymes No results for input(s): CKTOTAL, CKMB, CKMBINDEX, TROPONINI in the last 72 hours.  BNP: BNP (last 3 results) Recent Labs    04/20/24 1052 05/03/24 2146 05/04/24 0333  BNP 2,103.7* 2,633.5* 2,770.3*    ProBNP (last 3 results) No results for input(s): PROBNP in the last 8760 hours.   D-Dimer No results for input(s): DDIMER in the last 72 hours. Hemoglobin A1C No results for input(s): HGBA1C in  the last 72 hours. Fasting Lipid Panel No results for input(s): CHOL, HDL, LDLCALC, TRIG, CHOLHDL, LDLDIRECT in the last 72 hours. Thyroid Function Tests No results for input(s): TSH, T4TOTAL, T3FREE, THYROIDAB in the last 72 hours.  Invalid input(s): FREET3  Other results:     Medications:     Scheduled Medications:  amiodarone   200 mg Oral Daily   apixaban   5 mg Oral BID   atorvastatin   40 mg Oral Daily   Chlorhexidine  Gluconate Cloth  6 each Topical Daily   ferrous sulfate   325 mg Oral Daily   furosemide   40 mg Oral Daily   hydrALAZINE   25 mg Oral Q8H   insulin  aspart  0-6 Units Subcutaneous TID WC   isosorbide  mononitrate  30 mg Oral Daily   polyethylene glycol  17 g Oral Daily     Infusions:    PRN Medications: acetaminophen  **OR** acetaminophen , guaiFENesin -dextromethorphan , ipratropium-albuterol , melatonin, ondansetron  (ZOFRAN ) IV      Assessment/Plan   Acute on chronic systolic CHF>>>cardiogenic shock -Lactic acid initially > 6 w/ evidence of end organ dysfunction with elevated LFTs and AKI. One blood cuutlure positive for staph epidermidis, likely contaminant. Urine culture also contaminated. Treated for UTI, but suspect acute illness primarily d/t cardiogenic shock. -CM likely at least in part ischemic. Had single vessel RCA disease in 2017 treated medically. Also has LBBB with wide QRS ( 150 ms) and significant MR. -Echo 04/25: EF 30-35%, grade II Ddm, RV okay, RVSP 70 mmHg, severe ischemic MR, moderate to severe TR -NYHA III on admission. RHC with elevated filling pressures thermo CI 1.9.  Post cath diuresed with IV lasix  and started on milrinone .  -Echo this admit EF 35-40%, RV mildly reduced, mod-severe MR, mod TR -Tolerating milrinone  wean well, creatinine improved, stable coox - Stop milrinone  today, repeat coox this afternoon - Reduce lasix  to 40mg  daily, post ATN diuresis  - Holding SGLT-2 with UTI -continue Hydral 25 tid + Imdur  30 mg daily for afterload reduction  -additional GDMT limited by renal fx   MR/TR - mod-severe MR w/ restricted posterior leaflet on echo - mod TR - needs TEE but will wait to assess as outpatient, once recovered from acute respiratory virus  - continue HF optimization per above    PAF - Previously diagnosed on device - SR this admission. No AF on device check 08/04. - Continue amio 200 mg daily  - Continue Eliquis  5 mg twice a day.     Hx VT/VF - Has single chamber ICD - Continue amiodarone  200 mg daily    Elevated LFTs - AST peaked ~ 4,000, ALT peaked ~ 2200, T bili 3.8 -Likley shock liver - Now trending down. Continue to follow      AKI on CKD IIIb - Suspect ATN in setting of shock - Scr improved  to 2.4 - Reduce PO diuretics as aobve   Possible UTI - Abx per primary team - Hold off on addition of SGLT2i this admit    CAD -Single vessel RCA disease on cath in 2017. Managed medically -Not on aspirin  with need for anticoagulation -No chest pain.   -Statin has been held. LFTs trending down.   IDA  - Hgb drifting down. No obvious source. - Tsat 12%. Needs IV Fe   Metapneumovirus  - supportive care     Length of Stay: 5  [name], MD  05/09/2024, 4:36 PM  Advanced Heart Failure Team Pager [phone] (M-F; 7a - 5p)  Please contact [facility]  for night-coverage after hours (5p -7a ) and weekends on [email or web address]

## 2024-05-09 NOTE — Progress Notes (Signed)
 Mobility Specialist Progress Note;   05/09/24 0920  Mobility  Activity Pivoted/transferred from bed to chair  Level of Assistance Contact guard assist, steadying assist  Assistive Device None  Distance Ambulated (ft) 5 ft  Activity Response Tolerated well  Mobility Referral Yes  Mobility visit 1 Mobility  Mobility Specialist Start Time (ACUTE ONLY) 0920  Mobility Specialist Stop Time (ACUTE ONLY) 0934  Mobility Specialist Time Calculation (min) (ACUTE ONLY) 14 min   Pt agreeable to mobility. Required MinG assistance to stand and take some steps to chair safely. VSS on RA and no c/o when asked. Pt left comfortably in chair with all needs met, call bell in reach.   Lauraine Erm Mobility Specialist Please contact via SecureChat or [employer] [phone]

## 2024-05-09 NOTE — Plan of Care (Signed)

## 2024-05-09 NOTE — Progress Notes (Signed)
 Physical Therapy Treatment Patient Details Name: Dean Babington Sr. MRN: [id number] DOB: 20-Feb-1951 Today's Date: 05/09/2024   History of Present Illness Pt is a 73 y.o. male admitted with acute on chronic systolic/diastolic heart failure, acute hypoxic respiratory distress, severe sepsis due to acute cystitis, AKI and mild hyperkalemia. Respiratory panel positive for metapneumovirus 8/13. 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    PT Comments  Pt received in supine, agreeable to therapy session and good participation with increased time needed to perform transfers and gait. Pt receptive to instruction on rollator safety and use of brakes, but decreased carryover of instruction within session, pt will need further reminders for use of brakes and transfer safety working with mobility, therapy and nursing staff over weekend and would benefit from daily ambulation in room or hallway daily over weekend, mobility team notified. Pt needing up to minA for bed mobility from flat bed without rails and up to CGA for transfers/gait. Pt agreeable to sit up in chair to eat dinner as he reported he did not notice it arrive, but it had been in room ~30 mins at least per RN report. Pt continues to benefit from PT services to progress toward functional mobility goals, continue to recommend HHPT upon DC.     If plan is discharge home, recommend the following: A little help with walking and/or transfers;A little help with bathing/dressing/bathroom;Assistance with cooking/housework;Help with stairs or ramp for entrance   Can travel by private vehicle        Equipment Recommendations  Rollator (4 wheels) ((4WW already delivered to his room))    Recommendations for Other Services       Precautions / Restrictions  Precautions Precautions: Fall Recall of Precautions/Restrictions: Intact Precaution/Restrictions Comments: Droplet precs; watch BP/O2 Restrictions Weight Bearing Restrictions Per Provider Order: No     Mobility  Bed Mobility Overal bed mobility: Needs Assistance Bed Mobility: Supine to Sit     Supine to sit: Min assist     General bed mobility comments: from flat bed, no rails per home set-up, pt initially requesting HHA but admits he normally does it on his own, with dense cues on technique pt gets to EOB with CGA, and very light minA trunk support to achieve upright.    Transfers Overall transfer level: Needs assistance Equipment used: Rollator (4 wheels) Transfers: Sit to/from Stand Sit to Stand: Contact guard assist           General transfer comment: cues for brake use and safe UE placement, increased time to plan and initiate transfer; pt forgets brakes prior to sitting, needs verbal and gestural cues to lock rollator, and some assist to push down on brakes on first attempt to get them to lock. Pt needs reminder also to reach for arm rest of chair as he was sitting close to one side of chair; unsafely close to arm rest.    Ambulation/Gait Ambulation/Gait assistance: Contact guard assist Gait Distance (Feet): 35 Feet Assistive device: Rollator (4 wheels) Gait Pattern/deviations: Step-through pattern, Decreased stride length       General Gait Details: no buckling or LOB using rollator, improved posture; SpO2/HR [facility]   Stairs             Wheelchair Mobility     Tilt Bed    Modified Rankin (Stroke Patients Only)  Balance Overall balance assessment: Needs assistance Sitting-balance support: No upper extremity supported, Feet supported Sitting balance-Leahy Scale: Good     Standing balance support: Bilateral upper extremity supported, During functional activity, Reliant on assistive device for balance Standing balance-Leahy Scale:  Poor Standing balance comment: reliant on UE support                            Communication Communication Communication: No apparent difficulties Factors Affecting Communication:  (slow processing)  Cognition Arousal: Alert Behavior During Therapy: WFL for tasks assessed/performed, Flat affect   PT - Cognitive impairments: Sequencing, Problem solving                       PT - Cognition Comments: Slow processing, pt needs increased time/cues to problem solve bed mobility from flat bed per home set-up, multiple minutes sitting EOB to get his bearings prior to standing. Decreased recall of cues for rollator brake mgmt. Following commands: Intact      Cueing Cueing Techniques: Verbal cues, Gestural cues  Exercises      General Comments General comments (skin integrity, edema, etc.): DBP had been soft earlier in the day, but no dizziness or c/o lightheadness with postural changes, SpO2/HR WFL on RA. Pt coughing frequently. Pt encouraged to sit up in chair while eating dinner and agreeable to notify staff when needing to get back to bed due to many lines and increased fall risk.      Pertinent Vitals/Pain Pain Assessment Pain Assessment: No/denies pain Pain Intervention(s): Monitored during session, Repositioned    Home Living                          Prior Function            PT Goals (current goals can now be found in the care plan section) Acute Rehab PT Goals Patient Stated Goal: Get well, go home, work (as a Education officer, environmental) PT Goal Formulation: With patient Time For Goal Achievement: 05/18/24 Progress towards PT goals: Progressing toward goals    Frequency    Min 2X/week      PT Plan      Co-evaluation              AM-PAC PT 6 Clicks Mobility   Outcome Measure  Help needed turning from your back to your side while in a flat bed without using bedrails?: None Help needed moving from lying on your back to sitting on the side of  a flat bed without using bedrails?: A Little Help needed moving to and from a bed to a chair (including a wheelchair)?: A Little Help needed standing up from a chair using your arms (e.g., wheelchair or bedside chair)?: A Little Help needed to walk in hospital room?: A Little Help needed climbing 3-5 steps with a railing? : A Little 6 Click Score: 19    End of Session Equipment Utilized During Treatment: Gait belt Activity Tolerance: Patient tolerated treatment well;Patient limited by fatigue Patient left: in chair;with call bell/phone within reach;Other (comment) (no chair alarm in room) Nurse Communication: Mobility status;Other (comment) (no chair alarm seen in his room) PT Visit Diagnosis: Unsteadiness on feet (R26.81);Other abnormalities of gait and mobility (R26.89);Muscle weakness (generalized) (M62.81);Difficulty in walking, not elsewhere classified (R26.2)     Time: 8289-8270 PT Time Calculation (min) (ACUTE ONLY): 19 min  Charges:    $Gait Training: 8-22 mins PT  General Charges $$ ACUTE PT VISIT: 1 Visit                     Tel Hevia P., PTA Acute Rehabilitation Services Secure Chat Preferred 9a-5:30pm Office: [phone]    Connell HERO [facility] 05/09/2024, 5:46 PM

## 2024-05-10 ENCOUNTER — Other Ambulatory Visit (HOSPITAL_COMMUNITY): Payer: Self-pay

## 2024-05-10 DIAGNOSIS — I5023 Acute on chronic systolic (congestive) heart failure: Secondary | ICD-10-CM | POA: Diagnosis not present

## 2024-05-10 LAB — BASIC METABOLIC PANEL WITH GFR
Anion gap: 7 (ref 5–15)
BUN: 56 mg/dL — ABNORMAL HIGH (ref 8–23)
CO2: 26 mmol/L (ref 22–32)
Calcium: 9.4 mg/dL (ref 8.9–10.3)
Chloride: 102 mmol/L (ref 98–111)
Creatinine, Ser: 1.78 mg/dL — ABNORMAL HIGH (ref 0.61–1.24)
GFR, Estimated: 40 mL/min — ABNORMAL LOW (ref >60)
Glucose, Bld: 108 mg/dL — ABNORMAL HIGH (ref 70–99)
Potassium: 4.1 mmol/L (ref 3.5–5.1)
Sodium: 135 mmol/L (ref 135–145)

## 2024-05-10 LAB — COOXEMETRY PANEL
Carboxyhemoglobin: 2 % — ABNORMAL HIGH (ref 0.5–1.5)
Carboxyhemoglobin: 2.4 % — ABNORMAL HIGH (ref 0.5–1.5)
Methemoglobin: <0.7 % (ref 0.0–1.5)
Methemoglobin: <0.7 % (ref 0.0–1.5)
O2 Saturation: 99.3 %
O2 Saturation: 80.1 %
Total hemoglobin: 11.4 g/dL — ABNORMAL LOW (ref 12.0–16.0)
Total hemoglobin: 11.4 g/dL — ABNORMAL LOW (ref 12.0–16.0)

## 2024-05-10 LAB — GLUCOSE, CAPILLARY
Glucose-Capillary: 85 mg/dL (ref 70–99)
Glucose-Capillary: 172 mg/dL — ABNORMAL HIGH (ref 70–99)

## 2024-05-10 MED ORDER — BENZONATATE 100 MG PO CAPS
100.0000 mg | ORAL_CAPSULE | Freq: Three times a day (TID) | ORAL | Status: DC | PRN
  Administered 2024-05-10 (×2): 100 mg via ORAL
  Filled 2024-05-10 (×2): qty 1

## 2024-05-10 MED ORDER — ALLOPURINOL 100 MG PO TABS
100.0000 mg | ORAL_TABLET | Freq: Every day | ORAL | 0 refills | Status: DC
  Filled 2024-05-10: qty 30, 30d supply, fill #0

## 2024-05-10 MED ORDER — HYDRALAZINE HCL 25 MG PO TABS
25.0000 mg | ORAL_TABLET | Freq: Three times a day (TID) | ORAL | 1 refills | Status: DC
  Filled 2024-05-10: qty 30, 10d supply, fill #0

## 2024-05-10 MED ORDER — FUROSEMIDE 40 MG PO TABS
40.0000 mg | ORAL_TABLET | Freq: Every day | ORAL | 1 refills | Status: DC
  Filled 2024-05-10: qty 30, 30d supply, fill #0

## 2024-05-10 MED ORDER — ISOSORBIDE MONONITRATE ER 30 MG PO TB24
30.0000 mg | ORAL_TABLET | Freq: Every day | ORAL | 1 refills | Status: DC
  Filled 2024-05-10: qty 30, 30d supply, fill #0

## 2024-05-10 NOTE — TOC Transition Note (Addendum)
 Transition of Care [facility]) - Discharge Note   Patient Details  Name: Dean Duris Sr. MRN: [id number] Date of Birth: 02-01-1951  Transition of Care [facility]) CM/SW Contact:  Robynn Eileen Hoose, RN Phone Number: 05/10/2024, 12:35 PM   Clinical Narrative:     Patient is being discharged today. Amy with [facility] made aware. Unable to fax discharge summary to PCP due to office closed, unable to obtain fax number. Discharge summary given to Covering CM for Monday.  Final next level of care: Home w Home Health Services Barriers to Discharge: No Barriers Identified   Patient Goals and CMS Choice Patient states their goals for this hospitalization and ongoing recovery are:: plan is to return home once stable. CMS Medicare.gov Compare Post Acute Care list provided to:: Patient Choice offered to / list presented to : Patient      Discharge Placement                       Discharge Plan and Services Additional resources added to the After Visit Summary for   In-house Referral: NA Discharge Planning Services: CM Consult Post Acute Care Choice: Home Health          DME Arranged: Walker rolling with seat DME Agency: [facility]       HH Arranged: PT, OT HH Agency: [facility] Date [facility] Agency Contacted: 05/05/24 Time HH Agency Contacted: [phone] Representative spoke with at [facility] Agency: Amy  Social Drivers of Health (SDOH) Interventions SDOH Screenings   Food Insecurity: No Food Insecurity (05/04/2024)  Housing: Low Risk  (05/04/2024)  Transportation Needs: No Transportation Needs (05/04/2024)  Utilities: Not At Risk (05/04/2024)  Social Connections: Moderately Integrated (05/04/2024)  Recent Concern: Social Connections - Moderately Isolated (04/20/2024)  Tobacco Use: Low Risk  (05/04/2024)     Readmission Risk Interventions    05/09/2024    9:53 AM 04/22/2024   11:11 AM  Readmission Risk Prevention Plan  Transportation Screening Complete Complete  PCP or Specialist  Appt within 5-7 Days  Complete  Home Care Screening  Complete  Medication Review (RN CM)  Complete  Medication Review (RN Care Manager) Complete   PCP or Specialist appointment within 3-5 days of discharge Complete   HRI or Home Care Consult Complete   SW Recovery Care/Counseling Consult Complete   Palliative Care Screening Not Applicable   Skilled Nursing Facility Not Applicable

## 2024-05-10 NOTE — Progress Notes (Signed)
 Pt stated that he had not slept well, requested standing weight to be obtained later in the morning.  Lonell LITTIE Lyme, RN

## 2024-05-10 NOTE — Progress Notes (Signed)
 Physical Therapy Treatment Patient Details Name: Dean [name] Sr. MRN: [id number] DOB: [birth year] Today's Date: 05/10/2024   History of Present Illness Pt is a 73 y.o. male admitted with acute on chronic systolic/diastolic heart failure, acute hypoxic respiratory distress, severe sepsis due to acute cystitis, AKI and mild hyperkalemia. Respiratory panel positive for metapneumovirus 8/13. 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    PT Comments  Agreeable to therapy visit today. Able to ambulate >100 feet with rollator on room air with sats 90-95%. No loss of balance. Cues for posture and pacing to conserve energy. States he is feeling closer to baseline but still fatigued. Agreeable to HHPT follow-up. Still delayed with processing. Had a BM in bed, unaware. Assisted with peri-care. Patient will continue to benefit from skilled physical therapy services to further improve independence with functional mobility.     If plan is discharge home, recommend the following: A little help with walking and/or transfers;A little help with bathing/dressing/bathroom;Assistance with cooking/housework;Help with stairs or ramp for entrance   Can travel by private vehicle        Equipment Recommendations  Rollator (4 wheels) ((4WW already delivered to his room))    Recommendations for Other Services       Precautions / Restrictions Precautions Precautions: Fall Recall of Precautions/Restrictions: Intact Precaution/Restrictions Comments: Droplet precs; watch BP/O2 Restrictions Weight Bearing Restrictions Per Provider Order: No     Mobility  Bed Mobility               General bed mobility comments: Sitting EOB when PT entered room    Transfers Overall transfer level: Needs assistance Equipment used:  Rollator (4 wheels) Transfers: Sit to/from Stand Sit to Stand: Supervision           General transfer comment: Supervision for safety, Requires cues for hand placement, brake application. No assist to rise or sit.    Ambulation/Gait Ambulation/Gait assistance: Contact guard assist Gait Distance (Feet): 105 Feet Assistive device: Rollator (4 wheels) Gait Pattern/deviations: Step-through pattern, Decreased stride length, Trunk flexed Gait velocity: dec Gait velocity interpretation: <1.8 ft/sec, indicate of risk for recurrent falls   General Gait Details: Cues for upright posture, no overt LOB noted. Reliant on Rollator for support and efficiency. Cues for symptom awareness. SpO2 90-95% on room air throughout distance.   Stairs             Wheelchair Mobility     Tilt Bed    Modified Rankin (Stroke Patients Only)       Balance Overall balance assessment: Needs assistance Sitting-balance support: No upper extremity supported, Feet supported Sitting balance-Leahy Scale: Good     Standing balance support: During functional activity, No upper extremity supported Standing balance-Leahy Scale: Fair Standing balance comment: More stable with rollator for support.                            Communication Communication Communication: No apparent difficulties  Cognition Arousal: Alert Behavior During Therapy: WFL for tasks assessed/performed, Flat affect   PT - Cognitive impairments: Sequencing, Problem solving                       PT - Cognition Comments: Slow processing, pt needs increased time/cues to problem solve. Following commands: Intact  Cueing Cueing Techniques: Verbal cues, Gestural cues  Exercises      General Comments General comments (skin integrity, edema, etc.): HR 68, SpO2 97% on RA, BP 145/47      Pertinent Vitals/Pain Pain Assessment Pain Assessment: No/denies pain    Home Living                           Prior Function            PT Goals (current goals can now be found in the care plan section) Acute Rehab PT Goals Patient Stated Goal: Get well, go home, work (as a Education officer, environmental) PT Goal Formulation: With patient Time For Goal Achievement: 05/18/24 Potential to Achieve Goals: Good Progress towards PT goals: Progressing toward goals    Frequency    Min 2X/week      PT Plan      Co-evaluation              AM-PAC PT 6 Clicks Mobility   Outcome Measure  Help needed turning from your back to your side while in a flat bed without using bedrails?: None Help needed moving from lying on your back to sitting on the side of a flat bed without using bedrails?: A Little Help needed moving to and from a bed to a chair (including a wheelchair)?: A Little Help needed standing up from a chair using your arms (e.g., wheelchair or bedside chair)?: A Little Help needed to walk in hospital room?: A Little Help needed climbing 3-5 steps with a railing? : A Little 6 Click Score: 19    End of Session Equipment Utilized During Treatment: Gait belt Activity Tolerance: Patient tolerated treatment well;Patient limited by fatigue Patient left: in chair;with call bell/phone within reach Nurse Communication: Mobility status PT Visit Diagnosis: Unsteadiness on feet (R26.81);Other abnormalities of gait and mobility (R26.89);Muscle weakness (generalized) (M62.81);Difficulty in walking, not elsewhere classified (R26.2)     Time: 8855-8840 PT Time Calculation (min) (ACUTE ONLY): 15 min  Charges:    $Gait Training: 8-22 mins PT General Charges $$ ACUTE PT VISIT: 1 Visit                     Leontine Roads, PT, DPT [facility]  Rehabilitation Services Physical Therapist Office: [phone] Website: [email or web address]    Leontine GORMAN Roads 05/10/2024, 1:27 PM

## 2024-05-10 NOTE — Progress Notes (Signed)
 PT Cancellation Note  Patient Details Name: Dean Homes Sr. MRN: [id number] DOB: [birth year]   Cancelled Treatment:    Reason Eval/Treat Not Completed: Patient declined, no reason specified  Sitting EOB, just finished eating breakfast. SpO2 94% at rest on RA. Declines to participate with PT at this time, states he didn't sleep well last night. Requested PT follow-up at a later time. Will check back later today as schedule permits.  Dean [name], PT, DPT [facility]  Rehabilitation Services Physical Therapist Office: [phone] Website: [email or web address]   Dean [name] 05/10/2024, 8:33 AM

## 2024-05-10 NOTE — Discharge Summary (Signed)
 Physician Discharge Summary  Dean Croft Sr. FMW:[id number] DOB: [birth year] DOA: 05/03/2024  PCP: [name], MD  Admit date: 05/03/2024 Discharge date: 05/10/2024  Time spent: 45 minutes  Recommendations for Outpatient Follow-up:  [facility] follow-up for CHF and mitral regurgitation Needs TEE in 3 to 4 weeks Please check BMP in 1 week Urology follow-up for suspected BPH   Discharge Diagnoses:  Principal Problem:   Acute on chronic systolic CHF (congestive heart failure) (HCC) Severe mitral regurgitation Metapneumovirus   Essential hypertension   Paroxysmal atrial fibrillation (HCC)   CAD (coronary artery disease)   Chronic kidney disease, stage 3b (HCC)   Type 2 diabetes mellitus with hyperlipidemia (HCC)   Acute cystitis   Obesity, class 1   Discharge Condition improved  Diet recommendation: Diet today, heart healthy  Filed Weights   05/07/24 0628 05/08/24 0415 05/09/24 0503  Weight: 91.7 kg 91.6 kg 89.8 kg    History of present illness:   73/M w systolic CHF,  PAF, hyperlipidemia, CKD 3b and T2DM who presented with dyspnea. 08/07 diagnosed with urinary tract infection and placed on antibiotic therapy.  Okay in ED, VSS, ++ edema,  cr 2,72, BNP 2,633 , troponin 61 and 53  Urine analysis SG >1.030, protein > 300, large hgb and small leukocytes, > 50 wbc and 21-50 rbc  CXR -bilateral hilar vascular congestion, with fluid in the right fissure CHG  Hospital Course:   Acute on chronic systolic CHF  Severe MR, mod to Severe TR - Repeat echo with EF 35-40%,, grade 2 DD, mildly reduced RV, mitral regurgitation - RHC with biventricular failure, elevated filling pressure, low cardiac output, moderate pulmonary hypertension, prominent V waves suggesting significant MR - Volume status improving, restarted diuretics, milrinone  weaned off  - Continue Imdur  and hydralazine , Lasix  40 mg daily resumed - Improved and stable for discharge, will need close follow-up with  cardiology for TEE/mitral valve evaluation   URI, metapneumovirus - Contributing to his cough as well, now improving, supportive care   Congestive hepatopathy.  03/2024 live US  with cholelithiasis, and benign hepatic hemangiomas.  Improving   Paroxysmal atrial fibrillation (HCC) Continue with amiodarone , apixaban .    CAD (coronary artery disease) - last ECHO in April w/ wall motion abnormality involving LV posterior wall and inferior basal segment - Single-vessel RCA disease noted on cath 2017, medical management - Statin on hold now   Chronic kidney disease, stage 3b (HCC) AKI, hyperkalemia. Hyponatremia.  - Stable improving   Type 2 diabetes mellitus  A1c is 5.4, diet controlled   E. coli UTI Past urine culture positive for E coli sensitive for cephalosporins, sp 3 days of cephalexin  One bottle blood culture positive for staphylococcus epidermidis, suspected contaminant. Repeat blood cultures negative thus far - Urology follow-up recommended, BPH suspected as risk factor for UTI   Obesity, class 1 Calculated BMI is 31,4   Discharge Exam: Vitals:   05/10/24 0324 05/10/24 0755  BP: (!) 141/53 (!) 140/47  Pulse: 71 73  Resp: 16 17  Temp: (!) 97.3 F (36.3 C) 97.6 F (36.4 C)  SpO2: 93% 91%   Gen: Awake, Alert, Oriented X 3,  HEENT: no JVD Lungs: Good air movement bilaterally, CTAB CVS: S1S2/RRR systolic murmur Abd: soft, Non tender, non distended, BS present Extremities: No edema Skin: no new rashes on exposed skin   Discharge Instructions   Discharge Instructions     Diet - low sodium heart healthy   Complete by: As directed    Increase  activity slowly   Complete by: As directed       Allergies as of 05/10/2024       Reactions   Contrast Media [iodinated Contrast Media] Shortness Of Breath, Nausea And Vomiting   Lisinopril  Cough        Medication List     STOP taking these medications    carvedilol  12.5 MG tablet Commonly known as: COREG     doxycycline  100 MG capsule Commonly known as: VIBRAMYCIN    valsartan  40 MG tablet Commonly known as: Diovan        TAKE these medications    allopurinol  100 MG tablet Commonly known as: ZYLOPRIM  Take 1 tablet (100 mg total) by mouth daily. What changed:  medication strength how much to take   amiodarone  200 MG tablet Commonly known as: PACERONE  Take 1 tablet (200 mg total) by mouth daily.   atorvastatin  40 MG tablet Commonly known as: LIPITOR Take 40 mg by mouth daily.   cholecalciferol  25 MCG (1000 UNIT) tablet Commonly known as: VITAMIN D3 Take 1,000 Units by mouth daily.   Eliquis  5 MG Tabs tablet Generic drug: apixaban  TAKE 1 TABLET BY MOUTH TWICE A DAY   ferrous sulfate  325 (65 FE) MG tablet Take 325 mg by mouth daily.   furosemide  40 MG tablet Commonly known as: LASIX  Take 1 tablet (40 mg total) by mouth daily. What changed: Another medication with the same name was removed. Continue taking this medication, and follow the directions you see here.   guaiFENesin -codeine  100-10 MG/5ML syrup Take 5 mLs by mouth at bedtime as needed for cough.   hydrALAZINE  25 MG tablet Commonly known as: APRESOLINE  Take 1 tablet (25 mg total) by mouth every 8 (eight) hours.   isosorbide  mononitrate 30 MG 24 hr tablet Commonly known as: IMDUR  Take 1 tablet (30 mg total) by mouth daily. Start taking on: May 11, 2024               Durable Medical Equipment  (From admission, onward)           Start     Ordered   05/09/24 [phone]  For home use only DME 4 wheeled rolling walker with seat  Once       Question Answer Comment  Patient needs a walker to treat with the following condition Physical deconditioning   Patient needs a walker to treat with the following condition Heart failure (HCC)      05/09/24 0942           Allergies  Allergen Reactions   Contrast Media [Iodinated Contrast Media] Shortness Of Breath and Nausea And Vomiting   Lisinopril  Cough     Follow-up Information     [facility] Follow up.   Why: [facility]- Physical and Occupational Therapy-office to call with visit times. Contact information: [address] [place] [address] [phone]         [name], MD Follow up.   Specialty: Internal Medicine Why: hospital follow up with PCP on 05/20/2025 at 1130 am Contact information: [address] [facility] [address] [phone]                  The results of significant diagnostics from this hospitalization (including imaging, microbiology, ancillary and laboratory) are listed below for reference.    Significant Diagnostic Studies: ECHOCARDIOGRAM COMPLETE Result Date: 05/08/2024    ECHOCARDIOGRAM REPORT   Patient Name:   Dean Blyden Sr. Date of Exam:  05/07/2024 Medical Rec #:  [id number]            Height:       66.0 in Accession #:    [id number]           Weight:       202.2 lb Date of Birth:  1950-09-27             BSA:          2.009 m Patient Age:    73 years             BP:           105/36 mmHg Patient Gender: M                    HR:           65 bpm. Exam Location:  Inpatient Procedure: 2D Echo, Cardiac Doppler, Color Doppler and Intracardiac            Opacification Agent (Both Spectral and Color Flow Doppler were            utilized during procedure). Indications:    CHF  History:        Patient has prior history of Echocardiogram examinations, most                 recent 08/25/2022. Cardiomyopathy, CAD, Arrythmias:Atrial                 Fibrillation; Risk Factors:Hypertension, Sleep Apnea and                 Diabetes.  Sonographer:    Ellouise Mose RDCS Referring Phys: [id number] BENJAMIN J STONER IMPRESSIONS  1. Left ventricular ejection fraction, by estimation, is 35 to 40%. The left ventricle has moderately decreased function. The left ventricle demonstrates global hypokinesis. There is mild concentric left ventricular hypertrophy. Left ventricular diastolic  parameters are consistent with Grade II diastolic dysfunction (pseudonormalization).  2. Right ventricular systolic function is mildly reduced. The right ventricular size is mildly enlarged. There is moderately elevated pulmonary artery systolic pressure. The estimated right ventricular systolic pressure is 58.4 mmHg.  3. Left atrial size was severely dilated.  4. Right atrial size was moderately dilated.  5. The posterior leaflet is restricted. The mitral valve is abnormal. Moderate to severe mitral valve regurgitation. No evidence of mitral stenosis. Moderate mitral annular calcification.  6. Tricuspid valve regurgitation is moderate.  7. The aortic valve is bicuspid. There is moderate calcification of the aortic valve. Aortic valve regurgitation is mild. No aortic stenosis is present.  8. The inferior vena cava is dilated in size with >50% respiratory variability, suggesting right atrial pressure of 8 mmHg. FINDINGS  Left Ventricle: Left ventricular ejection fraction, by estimation, is 35 to 40%. The left ventricle has moderately decreased function. The left ventricle demonstrates global hypokinesis. Definity  contrast agent was given IV to delineate the left ventricular endocardial borders. The left ventricular internal cavity size was normal in size. There is mild concentric left ventricular hypertrophy. Left ventricular diastolic parameters are consistent with Grade II diastolic dysfunction (pseudonormalization).  LV Wall Scoring: The posterior wall is akinetic. The inferior wall is hypokinetic. Right Ventricle: The right ventricular size is mildly enlarged. No increase in right ventricular wall thickness. Right ventricular systolic function is mildly reduced. There is moderately elevated pulmonary artery systolic pressure. The tricuspid regurgitant velocity is 3.55 m/s, and with an assumed right atrial pressure of 8 mmHg, the estimated right ventricular  systolic pressure is 58.4 mmHg. Left Atrium: Left atrial  size was severely dilated. Right Atrium: Right atrial size was moderately dilated. Pericardium: There is no evidence of pericardial effusion. Mitral Valve: The posterior leaflet is restricted. The mitral valve is abnormal. There is mild calcification of the mitral valve leaflet(s). Moderate mitral annular calcification. Moderate to severe mitral valve regurgitation, with centrally-directed jet. No evidence of mitral valve stenosis. Tricuspid Valve: The tricuspid valve is normal in structure. Tricuspid valve regurgitation is moderate . No evidence of tricuspid stenosis. Aortic Valve: The aortic valve is bicuspid. There is moderate calcification of the aortic valve. Aortic valve regurgitation is mild. No aortic stenosis is present. Aortic valve mean gradient measures 10.0 mmHg. Aortic valve peak gradient measures 17.8 mmHg. Aortic valve area, by VTI measures 2.96 cm. Pulmonic Valve: The pulmonic valve was normal in structure. Pulmonic valve regurgitation is not visualized. No evidence of pulmonic stenosis. Aorta: The aortic root is normal in size and structure. Venous: The inferior vena cava is dilated in size with greater than 50% respiratory variability, suggesting right atrial pressure of 8 mmHg. IAS/Shunts: No atrial level shunt detected by color flow Doppler. Additional Comments: A device lead is visualized.  LEFT VENTRICLE PLAX 2D LVIDd:         5.70 cm   Diastology LVIDs:         4.80 cm   LV e' medial:    4.80 cm/s LV PW:         1.60 cm   LV E/e' medial:  30.4 LV IVS:        1.30 cm   LV e' lateral:   9.46 cm/s LVOT diam:     2.70 cm   LV E/e' lateral: 15.4 LV SV:         1517 LV SV Index:   755 LVOT Area:     5.73 cm  RIGHT VENTRICLE              IVC RV Basal diam:  4.58 cm      IVC diam: 2.30 cm RV Mid diam:    3.68 cm RV Length:      9.58 cm RV S prime:     148.00 cm/s LEFT ATRIUM           Index LA diam:      5.50 cm 2.74 cm/m LA Vol (A2C): 95.7 ml 47.64 ml/m LA Vol (A4C): 88.7 ml 44.15 ml/m  AORTIC  VALVE AV Area (Vmax):    3.20 cm AV Area (VTI):     2.96 cm AV Vmax:           211.00 cm/s AV VTI:            5.130 m AV Peak Grad:      17.8 mmHg AV Mean Grad:      10.0 mmHg LVOT Vmax:         118.00 cm/s LVOT Vmean:        813.000 cm/s LVOT VTI:          2.650 m LVOT/AV VTI ratio: 0.52  AORTA Ao Root diam: 3.30 cm Ao Asc diam:  3.10 cm MITRAL VALVE                   TRICUSPID VALVE MV Area VTI: 3.19 cm          TR Peak grad:   50.4 mmHg MV VTI:      4.75 m  TR Vmax:        355.00 cm/s MR Peak grad:      94.9 mmHg MR Mean grad:      53.0 mmHg   SHUNTS MR Vmax:           487.00 cm/s Systemic VTI:  2.65 m MR Vmean:          335.0 cm/s  Systemic Diam: 2.70 cm MR Vena Contracta: 0.60 cm MV E velocity: 146.00 cm/s MV A velocity: 105.00 cm/s MV E/A ratio:  1.39 [name] MD Electronically signed by [name] MD Signature Date/Time: 05/08/2024/6:14:56 AM    Final    DG Chest 2 View Result Date: 05/07/2024 CLINICAL DATA:  Cough. EXAM: CHEST - 2 VIEW COMPARISON:  05/06/2024 FINDINGS: Right-sided central line with tip projecting over the SVC. 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 Increasing peribronchial thickening in both lungs. There may be small pleural effusions. No pneumothorax. Stable cardiomegaly. Unchanged mediastinal contours, aortic atherosclerosis. IMPRESSION: 1. Increasing peribronchial thickening in both lungs, may be pulmonary edema or bronchitis. 2. Possible small pleural effusions. 3. Stable cardiomegaly. Electronically Signed   By: Andrea Gasman M.D.   On: 05/07/2024 13:36   DG CHEST PORT 1 VIEW Result Date: 05/06/2024 CLINICAL DATA:  Central line EXAM: PORTABLE CHEST 1 VIEW COMPARISON:  Chest x-ray 05/03/2024 FINDINGS: There is a new right-sided central venous catheter with distal tip projecting over the SVC. There is no pneumothorax. Left-sided pacemaker again seen. Heart is enlarged, unchanged. No focal lung consolidation, pleural effusion or acute fracture.  IMPRESSION: New right-sided central venous catheter with distal tip projecting over the SVC. No pneumothorax. Electronically Signed   By: Greig Pique M.D.   On: 05/06/2024 20:55   CARDIAC CATHETERIZATION Result Date: 05/06/2024 Findings: RA = 15 RV = 63/13 PA = 64/27 (39) PCW = 25 (v waves to 40) Fick cardiac output/index = 4.1/2.1 Thermo CO/CI = 3.6/1.8 PVR = 3.4 (Fick) 3.8 (TD) Ao sat = 97% PA sat = 51%, 53% PAPi = 2.5 Assessment: 1. Biventricular HF with elevated filling pressures and low cardiac output 2. Mixed moderate pulmonary HTN 3. Prominent v-waves in PCW tracing suggestive of significant MR Plan/Discussion: Start milrinone . Leave central line in to follow co-ox and CVP. Repeat echo. Diurese. [name], MD 4:41 PM  US  EKG SITE RITE Result Date: 05/06/2024 If Site Rite image not attached, placement could not be confirmed due to current cardiac rhythm.  CT ABDOMEN PELVIS WO CONTRAST Result Date: 05/04/2024 CLINICAL DATA:  Transaminitis.  Sepsis. EXAM: CT ABDOMEN AND PELVIS WITHOUT CONTRAST TECHNIQUE: Multidetector CT imaging of the abdomen and pelvis was performed following the standard protocol without IV contrast. RADIATION DOSE REDUCTION: This exam was performed according to the departmental dose-optimization program which includes automated exposure control, adjustment of the mA and/or kV according to patient size and/or use of iterative reconstruction technique. COMPARISON:  CT stone study 04/17/2024 FINDINGS: Lower chest: Ill-defined clustered airspace nodularity in both lower lobes is compatible with pneumonia. Aspiration not excluded. Hepatobiliary: No suspicious focal abnormality in the liver on this study without intravenous contrast. Calcified gallstones evident in layering high attenuation material in the lumen of the gallbladder is probably sludge. Gallbladder wall appears ill-defined and a degree of pericholecystic edema/fluid is suspected. No intrahepatic or extrahepatic  biliary dilation. Pancreas: No focal mass lesion. No dilatation of the main duct. No intraparenchymal cyst. No peripancreatic edema. Spleen: No splenomegaly. No suspicious focal mass lesion. Adrenals/Urinary Tract: No adrenal nodule or mass. Unremarkable noncontrast appearance  of the right kidney. 8 mm low-density lesion lower pole left kidney is similar to prior, likely a cyst. No evidence for hydronephrosis. No evidence for hydroureter. Mild circumferential ill-defined bladder wall thickening is accentuated by underdistention. Stomach/Bowel: Stomach is unremarkable. No gastric wall thickening. No evidence of outlet obstruction. Duodenum is normally positioned as is the ligament of Treitz. No small bowel wall thickening. No small bowel dilatation. The appendix is normal. No gross colonic mass. No colonic wall thickening. Diverticular changes are noted in the left colon without evidence of diverticulitis. Vascular/Lymphatic: There is moderate atherosclerotic calcification of the abdominal aorta without aneurysm. 10 mm short axis portal caval lymph node is upper normal for size. No other abdominal lymphadenopathy evident. No pelvic sidewall lymphadenopathy. Reproductive: Prostate gland is enlarged and heterogeneous with diffuse calcification. 2.8 x 2.1 x 1.8 cm low-density lesion in the prostate, in the region of the median lobe, is more conspicuous than on the prior study when it measured 2.0 x 1.8 x 1.2 cm. The lesion measured 2.3 x 2.1 x 1.5 cm on a 04/01/2024 exam and was not present on a study from 06/29/2020. This does not have a characteristic appearance for a TURP defect and may be a prostatic utricle cyst or mullerian duct cyst. Given the history of sepsis, correlation for prostate abscess recommended. Other: There is some minimal free fluid adjacent to the liver and spleen. Musculoskeletal: Diffuse body wall edema evident. Degenerative changes noted in both hips. Degenerative changes noted in the lumbar  spine. No worrisome lytic or sclerotic osseous abnormality. IMPRESSION: 1. Ill-defined clustered airspace nodularity in both lower lobes is compatible with pneumonia. Aspiration not excluded. 2. Cholelithiasis with ill-defined gallbladder wall and possible pericholecystic edema/fluid. Imaging features raise concern for acute cholecystitis. Right upper quadrant ultrasound may prove helpful to further evaluate. 3. 2.8 x 2.1 x 1.8 cm low-density lesion in the prostate, in the region of the median lobe, is more conspicuous than on the prior study when it measured 2.0 x 1.8 x 1.2 cm. This does not have a characteristic appearance for a TURP defect and may be a prostatic utricle cyst or Mullerian duct cyst. Given the history of sepsis, correlation for prostate abscess recommended. 4. Mild circumferential ill-defined bladder wall thickening is accentuated by underdistention. Infection is not excluded. 5. Minimal free fluid adjacent to the liver and spleen. 6. Diffuse body wall edema. 7.  Aortic Atherosclerosis (ICD10-I70.0). Electronically Signed   By: Camellia Candle M.D.   On: 05/04/2024 06:15   DG Chest Port 1 View Result Date: 05/03/2024 CLINICAL DATA:  Shortness of breath EXAM: PORTABLE CHEST 1 VIEW COMPARISON:  Chest x-ray 05/02/2024 FINDINGS: The heart is enlarged, unchanged. There are atherosclerotic calcifications of the aorta. Left-sided pacemaker is again seen. There is no focal lung infiltrate, pleural effusion or pneumothorax. No acute fractures are seen. IMPRESSION: 1. No active disease. 2. Cardiomegaly. Electronically Signed   By: Greig Pique M.D.   On: 05/03/2024 23:09   DG Chest Portable 1 View Result Date: 05/02/2024 CLINICAL DATA:  Shortness of breath. EXAM: PORTABLE CHEST 1 VIEW COMPARISON:  04/20/2024 FINDINGS: The cardio pericardial silhouette is enlarged. There is pulmonary vascular congestion without overt pulmonary edema. The lungs are clear without focal pneumonia, edema, pneumothorax or  pleural effusion. Left-sided single lead pacer/AICD noted. Telemetry leads overlie the chest. IMPRESSION: Enlargement of the cardiopericardial silhouette with pulmonary vascular congestion. No overt pulmonary edema on the current study. Electronically Signed   By: Camellia Candle M.D.   On:  05/02/2024 05:29   DG Chest 2 View Result Date: 04/20/2024 CLINICAL DATA:  Shortness of breath. EXAM: CHEST - 2 VIEW COMPARISON:  Chest radiograph dated 04/18/2023. FINDINGS: Cardiomegaly with vascular congestion and mild edema. No consolidative changes. No large pleural effusion or pneumothorax. Left pectoral AICD device. No acute osseous pathology. IMPRESSION: Cardiomegaly with mild edema. Electronically Signed   By: Vanetta Chou M.D.   On: 04/20/2024 11:28   DG Chest Portable 1 View Result Date: 04/17/2024 EXAM: 1 VIEW XRAY OF THE CHEST 04/17/2024 03:20:00 AM COMPARISON: 03/31/2024 CLINICAL HISTORY: CHF. Legs and feet swollen. FINDINGS: LUNGS AND PLEURA: No frank interstitial edema. No pleural effusion. No pneumothorax. HEART AND MEDIASTINUM: Cardiomegaly. BONES AND SOFT TISSUES: No acute osseous abnormality. Left subclavian ICD in place. IMPRESSION: 1. Cardiomegaly. 2. No frank interstitial edema. Electronically signed by: Pinkie Pebbles MD 04/17/2024 03:25 AM EDT RP Workstation: [id number]   US  Abdomen Limited Result Date: 04/17/2024 EXAM: Right Upper Quadrant Abdominal Ultrasound 04/17/2024 02:27:06 AM TECHNIQUE: Real-time ultrasonography of the right upper quadrant of the abdomen was performed. COMPARISON: CT abdomen and pelvis earlier today. CLINICAL HISTORY: Cholelithiasis. FINDINGS: LIVER: Multiple echogenic hepatic lesions measuring up to 4.6 cm, previously characterized as benign hemangiomas on MR. BILIARY SYSTEM: Thick walled, underdistended gallbladder with cholesterolosis, suggesting gallbladder adenomyomatosis. Layering gallstones measuring up to 11 mm. Negative sonographic Murphy's sign. Common duct  measures 4 mm. RIGHT KIDNEY: The right kidney is grossly unremarkable in appearances without evidence of hydronephrosis, echogenic calculi or worrisome mass lesions. PANCREAS: Visualized portions of the pancreas are unremarkable. OTHER: No right upper quadrant ascites. IMPRESSION: 1. Cholelithiasis, without convincing sonographic findings to suggest acute cholecystitis. 2. 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 3. Benign hepatic hemangiomas, better evaluated on prior MR. Electronically signed by: Pinkie Pebbles MD 04/17/2024 02:35 AM EDT RP Workstation: [id number]   CT Renal Stone Study Result Date: 04/17/2024 CLINICAL DATA:  Upper abdominal pain. EXAM: CT ABDOMEN AND PELVIS WITHOUT CONTRAST TECHNIQUE: Multidetector CT imaging of the abdomen and pelvis was performed following the standard protocol without IV contrast. RADIATION DOSE REDUCTION: This exam was performed according to the departmental dose-optimization program which includes automated exposure control, adjustment of the mA and/or kV according to patient size and/or use of iterative reconstruction technique. COMPARISON:  April 01, 2024 FINDINGS: Lower chest: There is mild cardiomegaly. A small right pleural effusion is seen. Hepatobiliary: No focal liver abnormality is seen. A few subcentimeter gallstones are seen. Mild gallbladder wall thickening is suspected. There is no evidence of biliary dilatation. Pancreas: Unremarkable. No pancreatic ductal dilatation or surrounding inflammatory changes. Spleen: Normal in size without focal abnormality. Adrenals/Urinary Tract: Adrenal glands are unremarkable. Kidneys are normal in size, without renal calculi or hydronephrosis. A small stable subcentimeter renal cyst is seen within the lower pole of the left kidney. The urinary bladder is poorly distended and subsequently limited in evaluation. Moderate severity diffuse urinary bladder wall thickening is noted. Stomach/Bowel: Stomach is within normal limits.  Appendix appears normal. No evidence of bowel wall thickening, distention, or inflammatory changes. Noninflamed diverticula are seen throughout the descending and sigmoid colon. Vascular/Lymphatic: Aortic atherosclerosis. No enlarged abdominal or pelvic lymph nodes. Reproductive: The prostate gland is mildly enlarged with moderate to marked severity prostate gland calcifications. Other: Moderate severity anasarca is seen throughout the abdominal and pelvic walls. A mild amount of free fluid is seen within the left upper quadrant. Musculoskeletal: Multilevel degenerative changes are present throughout the lumbar spine. IMPRESSION: 1. Small right pleural effusion. 2. Cholelithiasis with mild gallbladder wall thickening.  Further evaluation with right upper quadrant ultrasound is recommended if acute cholecystitis is of clinical concern. 3. Colonic diverticulosis. 4. Moderate severity diffuse urinary bladder wall thickening which may be secondary to cystitis. Correlation with urinalysis is recommended. 5. Mild amount of left upper quadrant free fluid. 6. Moderate severity anasarca. 7. Aortic atherosclerosis. Electronically Signed   By: Suzen Dials M.D.   On: 04/17/2024 00:27    Microbiology: Recent Results (from the past 240 hours)  Resp panel by RT-PCR (RSV, Flu A&B, Covid) Anterior Nasal Swab     Status: None   Collection Time: 05/02/24  6:23 AM   Specimen: Anterior Nasal Swab  Result Value Ref Range Status   SARS Coronavirus 2 by RT PCR NEGATIVE NEGATIVE Final   Influenza A by PCR NEGATIVE NEGATIVE Final   Influenza B by PCR NEGATIVE NEGATIVE Final    Comment: (NOTE) The Xpert Xpress SARS-CoV-2/FLU/RSV plus assay is intended as an aid in the diagnosis of influenza from Nasopharyngeal swab specimens and should not be used as a sole basis for treatment. Nasal washings and aspirates are unacceptable for Xpert Xpress SARS-CoV-2/FLU/RSV testing.  Fact Sheet for  Patients: BloggerCourse.com  Fact Sheet for Healthcare Providers: SeriousBroker.it  This test is not yet approved or cleared by the [place]  FDA and has been authorized for detection and/or diagnosis of SARS-CoV-2 by FDA under an Emergency Use Authorization (EUA). This EUA will remain in effect (meaning this test can be used) for the duration of the COVID-19 declaration under Section 564(b)(1) of the Act, 21 U.S.C. section 360bbb-3(b)(1), unless the authorization is terminated or revoked.     Resp Syncytial Virus by PCR NEGATIVE NEGATIVE Final    Comment: (NOTE) Fact Sheet for Patients: BloggerCourse.com  Fact Sheet for Healthcare Providers: SeriousBroker.it  This test is not yet approved or cleared by the [place]  FDA and has been authorized for detection and/or diagnosis of SARS-CoV-2 by FDA under an Emergency Use Authorization (EUA). This EUA will remain in effect (meaning this test can be used) for the duration of the COVID-19 declaration under Section 564(b)(1) of the Act, 21 U.S.C. section 360bbb-3(b)(1), unless the authorization is terminated or revoked.  Performed at [facility], [address], [place], [place] [address]   Urine Culture     Status: Abnormal   Collection Time: 05/02/24  8:10 AM   Specimen: Urine, Random  Result Value Ref Range Status   Specimen Description URINE, RANDOM  Final   Special Requests   Final    NONE Reflexed from [id number] Performed at [facility], [address] [place], [place] [address]    Culture MULTIPLE SPECIES PRESENT, SUGGEST RECOLLECTION (A)  Final   Report Status 05/03/2024 FINAL  Final  Culture, blood (Routine X 2) w Reflex to ID Panel     Status: None   Collection Time: 05/04/24  1:10 AM   Specimen: BLOOD  Result Value Ref Range Status   Specimen Description BLOOD LEFT ANTECUBITAL  Final   Special  Requests   Final    BOTTLES DRAWN AEROBIC AND ANAEROBIC Blood Culture adequate volume   Culture   Final    NO GROWTH 5 DAYS Performed at [facility], [address], [place], [place] [address]    Report Status 05/09/2024 FINAL  Final  Culture, blood (Routine X 2) w Reflex to ID Panel     Status: Abnormal   Collection Time: 05/04/24  3:32 AM   Specimen: BLOOD RIGHT HAND  Result Value Ref Range Status   Specimen Description BLOOD RIGHT HAND  Final   Special Requests   Final    BOTTLES DRAWN AEROBIC AND ANAEROBIC Blood Culture results may not be optimal due to an inadequate volume of blood received in culture bottles   Culture  Setup Time   Final    GRAM POSITIVE COCCI IN CLUSTERS ANAEROBIC BOTTLE ONLY CRITICAL RESULT CALLED TO, READ BACK BY AND VERIFIED WITH: PHARMD JESSICA MILLEN ON 05/05/24 @ 1846 BY DRT    Culture (A)  Final    STAPHYLOCOCCUS EPIDERMIDIS THE SIGNIFICANCE OF ISOLATING THIS ORGANISM FROM A SINGLE SET OF BLOOD CULTURES WHEN MULTIPLE SETS ARE DRAWN IS UNCERTAIN. PLEASE NOTIFY THE MICROBIOLOGY DEPARTMENT WITHIN ONE WEEK IF SPECIATION AND SENSITIVITIES ARE REQUIRED. Performed at [facility], [address] [place], [place] [address]    Report Status 05/07/2024 FINAL  Final  Blood Culture ID Panel (Reflexed)     Status: Abnormal   Collection Time: 05/04/24  3:32 AM  Result Value Ref Range Status   Enterococcus faecalis NOT DETECTED NOT DETECTED Final   Enterococcus Faecium NOT DETECTED NOT DETECTED Final   Listeria monocytogenes NOT DETECTED NOT DETECTED Final   Staphylococcus species DETECTED (A) NOT DETECTED Final    Comment: CRITICAL RESULT CALLED TO, READ BACK BY AND VERIFIED WITH: PHARMD JESSICA MILLEN ON 05/05/24 @ 1846 BY DRT    Staphylococcus aureus (BCID) NOT DETECTED NOT DETECTED Final   Staphylococcus epidermidis DETECTED (A) NOT DETECTED Final    Comment: Methicillin (oxacillin) resistant coagulase negative staphylococcus. Possible blood  culture contaminant (unless isolated from more than one blood culture draw or clinical case suggests pathogenicity). No antibiotic treatment is indicated for blood  culture contaminants. CRITICAL RESULT CALLED TO, READ BACK BY AND VERIFIED WITH: PHARMD JESSICA MILLEN ON 05/05/24 @ 1846 BY DRT    Staphylococcus lugdunensis NOT DETECTED NOT DETECTED Final   Streptococcus species NOT DETECTED NOT DETECTED Final   Streptococcus agalactiae NOT DETECTED NOT DETECTED Final   Streptococcus pneumoniae NOT DETECTED NOT DETECTED Final   Streptococcus pyogenes NOT DETECTED NOT DETECTED Final   A.calcoaceticus-baumannii NOT DETECTED NOT DETECTED Final   Bacteroides fragilis NOT DETECTED NOT DETECTED Final   Enterobacterales NOT DETECTED NOT DETECTED Final   Enterobacter cloacae complex NOT DETECTED NOT DETECTED Final   Escherichia coli NOT DETECTED NOT DETECTED Final   Klebsiella aerogenes NOT DETECTED NOT DETECTED Final   Klebsiella oxytoca NOT DETECTED NOT DETECTED Final   Klebsiella pneumoniae NOT DETECTED NOT DETECTED Final   Proteus species NOT DETECTED NOT DETECTED Final   Salmonella species NOT DETECTED NOT DETECTED Final   Serratia marcescens NOT DETECTED NOT DETECTED Final   Haemophilus influenzae NOT DETECTED NOT DETECTED Final   Neisseria meningitidis NOT DETECTED NOT DETECTED Final   Pseudomonas aeruginosa NOT DETECTED NOT DETECTED Final   Stenotrophomonas maltophilia NOT DETECTED NOT DETECTED Final   Candida albicans NOT DETECTED NOT DETECTED Final   Candida auris NOT DETECTED NOT DETECTED Final   Candida glabrata NOT DETECTED NOT DETECTED Final   Candida krusei NOT DETECTED NOT DETECTED Final   Candida parapsilosis NOT DETECTED NOT DETECTED Final   Candida tropicalis NOT DETECTED NOT DETECTED Final   Cryptococcus neoformans/gattii NOT DETECTED NOT DETECTED Final   Methicillin resistance mecA/C DETECTED (A) NOT DETECTED Final    Comment: CRITICAL RESULT CALLED TO, READ BACK BY AND  VERIFIED WITH: PHARMD JESSICA MILLEN ON 05/05/24 @ 1846 BY DRT Performed at [facility],  [address], [place], [place] [address]   Culture, blood (Routine X 2) w Reflex to ID Panel     Status: None (Preliminary result)   Collection Time: 05/06/24  5:48 AM   Specimen: BLOOD  Result Value Ref Range Status   Specimen Description BLOOD BLOOD RIGHT HAND  Final   Special Requests   Final    BOTTLES DRAWN AEROBIC AND ANAEROBIC Blood Culture adequate volume   Culture   Final    NO GROWTH 4 DAYS Performed at [facility], [address], [place], [place] [address]    Report Status PENDING  Incomplete  Culture, blood (Routine X 2) w Reflex to ID Panel     Status: None (Preliminary result)   Collection Time: 05/06/24  5:49 AM   Specimen: BLOOD  Result Value Ref Range Status   Specimen Description BLOOD BLOOD LEFT HAND  Final   Special Requests   Final    BOTTLES DRAWN AEROBIC ONLY Blood Culture adequate volume   Culture   Final    NO GROWTH 4 DAYS Performed at [facility], [address] [place], [place] [address]    Report Status PENDING  Incomplete  Respiratory (~20 pathogens) panel by PCR     Status: Abnormal   Collection Time: 05/07/24  7:58 AM   Specimen: Nasopharyngeal Swab; Respiratory  Result Value Ref Range Status   Adenovirus NOT DETECTED NOT DETECTED Final   Coronavirus 229E NOT DETECTED NOT DETECTED Final    Comment: (NOTE) The Coronavirus on the Respiratory Panel, DOES NOT test for the novel  Coronavirus (2019 nCoV)    Coronavirus HKU1 NOT DETECTED NOT DETECTED Final   Coronavirus NL63 NOT DETECTED NOT DETECTED Final   Coronavirus OC43 NOT DETECTED NOT DETECTED Final   Metapneumovirus DETECTED (A) NOT DETECTED Final   Rhinovirus / Enterovirus NOT DETECTED NOT DETECTED Final   Influenza A NOT DETECTED NOT DETECTED Final   Influenza B NOT DETECTED NOT DETECTED Final   Parainfluenza Virus 1 NOT DETECTED NOT DETECTED Final   Parainfluenza Virus 2 NOT  DETECTED NOT DETECTED Final   Parainfluenza Virus 3 NOT DETECTED NOT DETECTED Final   Parainfluenza Virus 4 NOT DETECTED NOT DETECTED Final   Respiratory Syncytial Virus NOT DETECTED NOT DETECTED Final   Bordetella pertussis NOT DETECTED NOT DETECTED Final   Bordetella Parapertussis NOT DETECTED NOT DETECTED Final   Chlamydophila pneumoniae NOT DETECTED NOT DETECTED Final   Mycoplasma pneumoniae NOT DETECTED NOT DETECTED Final    Comment: Performed at [facility], [address] [place], [place] [address]     Labs: Basic Metabolic Panel: Recent Labs  Lab 05/04/24 [phone] 05/04/24 0508 05/05/24 0447 05/06/24 0548 05/06/24 1241 05/07/24 0554 05/08/24 0430 05/09/24 0500 05/10/24 0345  NA 139   < > 134* 133* 138  136 133* 133* 133* 135  K 6.6*   < > 4.2 4.6 3.7  3.9 3.6 4.2 4.1 4.1  CL 102   < > 100 100  --  99 103 102 102  CO2 21*  --  24 21*  --  25 25 25 26   GLUCOSE 65*   < > 133* 114*  --  97 95 125* 108*  BUN 45*   < > 55* 66*  --  69* 74* 72* 56*  CREATININE 2.87*   < > 2.77* 2.83*  --  3.10* 3.09* 2.48* 1.78*  CALCIUM  9.8  --  8.8* 8.8*  --  8.8* 8.8*  8.8* 9.4  MG 2.1  --  2.1 2.0  --  1.9 2.3 2.4  --   PHOS 5.1*  --   --   --   --   --   --   --   --    < > = values in this interval not displayed.   Liver Function Tests: Recent Labs  Lab 05/04/24 0333 05/05/24 0447 05/06/24 0929 05/07/24 0554  AST 3,966* 1,680* 696* 389*  ALT 2,269* 1,708* 1,188* 866*  ALKPHOS 89 82 81 74  BILITOT 3.8* 2.7* 2.3* 2.4*  PROT 7.1 6.0* 5.8* 5.4*  ALBUMIN  3.6 3.0* 2.8* 2.5*   No results for input(s): LIPASE, AMYLASE in the last 168 hours. No results for input(s): AMMONIA in the last 168 hours. CBC: Recent Labs  Lab 05/04/24 0113 05/04/24 0508 05/05/24 0447 05/06/24 1241 05/07/24 0554 05/08/24 0430 05/09/24 0500  WBC 12.3*  --  10.2  --  9.5 9.2 9.5  NEUTROABS 10.2*  --   --   --   --   --   --   HGB 10.9*   < > 10.8* 11.9*  12.2* 9.5* 9.9* 9.9*  HCT 36.3*    < > 34.2* 35.0*  36.0* 29.5* 30.3* 30.8*  MCV 95.5  --  91.4  --  88.9 87.3 87.5  PLT 60*  --  52*  --  54* 67* 75*   < > = values in this interval not displayed.   Cardiac Enzymes: No results for input(s): CKTOTAL, CKMB, CKMBINDEX, TROPONINI in the last 168 hours. BNP: BNP (last 3 results) Recent Labs    04/20/24 1052 05/03/24 2146 05/04/24 0333  BNP 2,103.7* 2,633.5* 2,770.3*    ProBNP (last 3 results) No results for input(s): PROBNP in the last 8760 hours.  CBG: Recent Labs  Lab 05/09/24 1117 05/09/24 1620 05/09/24 2210 05/10/24 0647 05/10/24 1148  GLUCAP 96 114* 126* 85 172*       Signed:  Sigurd Pac MD.  [facility] 05/10/2024, 12:11 PM

## 2024-05-10 NOTE — Progress Notes (Signed)
 Advanced Heart Failure Rounding Note  Cardiologist: Vinie JAYSON Maxcy, MD  Chief Complaint: Systolic Heart Failure   Patient Profile   73 y.o. male with history of chronic systolic CHF, CAD, VT/VF arrest s/p single chamber ICD, LBBB, PAF, MR, CKD IIIb, OSA on CPAP, DM II, HTN.    Admitted with acute on chronic systolic CHF>>cardiogenic shock. Also with concomitant metapneumovirus infection.   Subjective:   8/12- RHC - Elevated filling pressures. Fick CO 4.1/CI 2.1  Thermo CO 3.6/ CI 1.8. Started on milrinone  0.25 mcg + lasix  drip.  8/13: Echo EF 35-40%, RV mildly reduced, RVSP 58, Mod-severe MR w/ restricted posterior leaflet, mod TR   Off milrinone  since yesterday am. Feels good. Co-ox 80%  Denies CP or SOB   Scr 3.1 -> 2.5 -> 1.8  Objective:   Weight Range: 89.8 kg Body mass index is 31.96 kg/m.   Vital Signs:   Temp:  [97.3 F (36.3 C)-97.8 F (36.6 C)] 97.6 F (36.4 C) (08/16 0755) Pulse Rate:  [66-73] 73 (08/16 0755) Resp:  [16-20] 17 (08/16 0755) BP: (121-148)/(41-63) 140/47 (08/16 0755) SpO2:  [91 %-95 %] 91 % (08/16 0755) Last BM Date : 05/09/24  Weight change: Filed Weights   05/07/24 0628 05/08/24 0415 05/09/24 0503  Weight: 91.7 kg 91.6 kg 89.8 kg    Intake/Output:   Intake/Output Summary (Last 24 hours) at 05/10/2024 1127 Last data filed at 05/10/2024 0650 Gross per 24 hour  Intake 360 ml  Output 1900 ml  Net -1540 ml     Physical Exam   General:  Sitting up No resp difficulty HEENT: normal Neck: supple. no JVD. Carotids 2+ bilat; no bruits. No lymphadenopathy or thryomegaly appreciated. Cor: PMI nondisplaced. Regular rate & rhythm. 2/6 MR Lungs: clear Abdomen: soft, nontender, nondistended. No hepatosplenomegaly. No bruits or masses. Good bowel sounds. Extremities: no cyanosis, clubbing, rash, edema Neuro: alert & orientedx3, cranial nerves grossly intact. moves all 4 extremities w/o difficulty. Affect pleasant  Telemetry   NSR 70-80s  Personally reviewed   Labs    CBC Recent Labs    05/08/24 0430 05/09/24 0500  WBC 9.2 9.5  HGB 9.9* 9.9*  HCT 30.3* 30.8*  MCV 87.3 87.5  PLT 67* 75*   Basic Metabolic Panel Recent Labs    91/85/74 0430 05/09/24 0500 05/10/24 0345  NA 133* 133* 135  K 4.2 4.1 4.1  CL 103 102 102  CO2 25 25 26   GLUCOSE 95 125* 108*  BUN 74* 72* 56*  CREATININE 3.09* 2.48* 1.78*  CALCIUM  8.8* 8.8* 9.4  MG 2.3 2.4  --    Liver Function Tests No results for input(s): AST, ALT, ALKPHOS, BILITOT, PROT, ALBUMIN  in the last 72 hours.  No results for input(s): LIPASE, AMYLASE in the last 72 hours. Cardiac Enzymes No results for input(s): CKTOTAL, CKMB, CKMBINDEX, TROPONINI in the last 72 hours.  BNP: BNP (last 3 results) Recent Labs    04/20/24 1052 05/03/24 2146 05/04/24 0333  BNP 2,103.7* 2,633.5* 2,770.3*    ProBNP (last 3 results) No results for input(s): PROBNP in the last 8760 hours.   D-Dimer No results for input(s): DDIMER in the last 72 hours. Hemoglobin A1C No results for input(s): HGBA1C in the last 72 hours. Fasting Lipid Panel No results for input(s): CHOL, HDL, LDLCALC, TRIG, CHOLHDL, LDLDIRECT in the last 72 hours. Thyroid Function Tests No results for input(s): TSH, T4TOTAL, T3FREE, THYROIDAB in the last 72 hours.  Invalid input(s): FREET3  Other results:  Medications:     Scheduled Medications:  amiodarone   200 mg Oral Daily   apixaban   5 mg Oral BID   atorvastatin   40 mg Oral Daily   Chlorhexidine  Gluconate Cloth  6 each Topical Daily   ferrous sulfate   325 mg Oral Daily   furosemide   40 mg Oral Daily   hydrALAZINE   25 mg Oral Q8H   insulin  aspart  0-6 Units Subcutaneous TID WC   isosorbide  mononitrate  30 mg Oral Daily   polyethylene glycol  17 g Oral Daily    Infusions:    PRN Medications: acetaminophen  **OR** acetaminophen , benzonatate , guaiFENesin -dextromethorphan ,  ipratropium-albuterol , melatonin, ondansetron  (ZOFRAN ) IV      Assessment/Plan   Acute on chronic systolic CHF>>>cardiogenic shock -Lactic acid initially > 6 w/ evidence of end organ dysfunction with elevated LFTs and AKI. One blood cuutlure positive for staph epidermidis, likely contaminant. Urine culture also contaminated. Treated for UTI, but suspect acute illness primarily d/t cardiogenic shock. -CM likely at least in part ischemic. Had single vessel RCA disease in 2017 treated medically. Also has LBBB with wide QRS ( 150 ms) and significant MR. -Echo 04/25: EF 30-35%, grade II Ddm, RV okay, RVSP 70 mmHg, severe ischemic MR, moderate to severe TR -NYHA III on admission. RHC with elevated filling pressures thermo CI 1.9.  Post cath diuresed with IV lasix  and started on milrinone .  -Echo this admit EF 35-40%, RV mildly reduced, mod-severe MR, mod TR - Milrinone  stopped yesterday. Co-ox 83%  Volume status ok  - Scr improved but will continue to hold ARB for now. Can restart as outpatient (consider Entresto ) - No SGLT2i with UTI - No bblocker with shock  - Ok for home today. HF meds on d/c  Lasix  40 daily  Hydral 25 tid Imdur  30 daily  Amio 200 every day Eliquis  5 bid  Atorva 40  Will need outpatient f/u and TEE  MR/TR - mod-severe MR w/ restricted posterior leaflet on echo - mod TR - needs TEE but will wait to assess as outpatient, once recovered from acute respiratory virus     PAF - Previously diagnosed on device - SR this admission. No AF on device check 08/04. - Continue amio 200 mg daily  - Continue Eliquis  5 mg twice a day.     Hx VT/VF - Has single chamber ICD - Continue amiodarone  200 mg daily    Elevated LFTs - AST peaked ~ 4,000, ALT peaked ~ 2200, T bili 3.8 -Likley shock liver - Resolving     AKI on CKD IIIb - Suspect ATN in setting of shock - Scr improved to 1.8 - Reduce PO diuretics as aobve   Possible UTI - Abx per primary team - Hold off on  addition of SGLT2i this admit    CAD -Single vessel RCA disease on cath in 2017. Managed medically -Not on aspirin  with need for anticoagulation -No chest pain.   -Statin has been held. LFTs trending down. Back on atorva  IDA  - Hgb drifting down. No obvious source. - Tsat 12%. Needs IV Fe   Metapneumovirus  - supportive care     Length of Stay: 6  [name], MD  05/10/2024, 11:27 AM  Advanced Heart Failure Team Pager [phone] (M-F; 7a - 5p)  Please contact [facility] for night-coverage after hours (5p -7a ) and weekends on [email or web address]

## 2024-05-10 NOTE — Progress Notes (Signed)
 PROGRESS NOTE    Dean Croft Sr.  FMW:[id number] DOB: August 02, 1951 DOA: 05/03/2024 PCP: [name], MD   73/M w systolic CHF,  PAF, hyperlipidemia, CKD 3b and T2DM who presented with dyspnea. 08/07 diagnosed with urinary tract infection and placed on antibiotic therapy.  Okay in ED, VSS, ++ edema,  cr 2,72, BNP 2,633 , troponin 61 and 53  Urine analysis SG >1.030, protein > 300, large hgb and small leukocytes, > 50 wbc and 21-50 rbc  CXR -bilateral hilar vascular congestion, with fluid in the right fissure CHG Thank you -8/12 poor response to diuretics, low blood pressure, lactic acidosis Possible low output heart failure, >consulted heart failure team. 8/12, right heart cath with elevated filling pressures, cardiac index 2.1, Thermo CO 1.8 started on milrinone  and Lasix  drip - Persistent cough, 8/13 positive for metapneumovirus - 8/14, restarted Lasix , milrinone  wean  Subjective: - Feels better overall, cough is improving, denies dyspnea at rest, ambulated yesterday  Assessment and Plan:  Acute on chronic systolic CHF  Severe MR, mod to Severe TR - Repeat echo with EF 35-40%,, grade 2 DD, mildly reduced RV, mitral regurgitation - RHC with biventricular failure, elevated filling pressure, low cardiac output, moderate pulmonary hypertension, prominent V waves suggesting significant MR - Volume status improving, restarted diuretics, milrinone  weaned off yesterday - Continue Imdur  and hydralazine  - DC home when cleared by cards, will need close follow-up for TEE/mitral valve evaluation  URI, metapneumovirus - Contributing to his cough as well, now improving, supportive care  Congestive hepatopathy.  03/2024 live US  with cholelithiasis, and benign hepatic hemangiomas.  Improving  Paroxysmal atrial fibrillation (HCC) Continue with amiodarone , apixaban .   CAD (coronary artery disease) - last ECHO in April w/ wall motion abnormality involving LV posterior wall and inferior basal  segment - Single-vessel RCA disease noted on cath 2017, medical management - Statin on hold now  Chronic kidney disease, stage 3b (HCC) AKI, hyperkalemia. Hyponatremia.  - Stable improving  Type 2 diabetes mellitus  CBGs are stable  E. coli UTI Past urine culture positive for E coli sensitive for cephalosporins, sp 3 days of cephalexin  One bottle blood culture positive for staphylococcus epidermidis, suspected contaminant.  Repeat blood cultures negative thus far  Obesity, class 1 Calculated BMI is 31,4   DVT prophylaxis: Apixaban  Code Status: Full code Family Communication: none present Disposition Plan: Home later today Consultants: CHF team   Procedures:   Antimicrobials:    Objective: Vitals:   05/09/24 2014 05/09/24 2350 05/10/24 0324 05/10/24 0755  BP:  (!) 148/63 (!) 141/53 (!) 140/47  Pulse:  66 71 73  Resp:  20 16 17   Temp: (!) 97.5 F (36.4 C) 97.6 F (36.4 C) (!) 97.3 F (36.3 C) 97.6 F (36.4 C)  TempSrc: Oral Oral Oral Oral  SpO2: 94% 95% 93% 91%  Weight:      Height:        Intake/Output Summary (Last 24 hours) at 05/10/2024 1046 Last data filed at 05/10/2024 0650 Gross per 24 hour  Intake 360 ml  Output 1900 ml  Net -1540 ml   Filed Weights   05/07/24 0628 05/08/24 0415 05/09/24 0503  Weight: 91.7 kg 91.6 kg 89.8 kg    Examination:  Gen: Awake, Alert, Oriented X 3,  HEENT: Positive JVD, right IJ central line Lungs: Scattered rhonchi CVS: S1S2/RRR, systolic murmur Abd: soft, Non tender, non distended, BS present Extremities: no edema Skin: no new rashes on exposed skin    Data Reviewed:  CBC: Recent Labs  Lab 05/04/24 0113 05/04/24 0508 05/05/24 0447 05/06/24 1241 05/07/24 0554 05/08/24 0430 05/09/24 0500  WBC 12.3*  --  10.2  --  9.5 9.2 9.5  NEUTROABS 10.2*  --   --   --   --   --   --   HGB 10.9*   < > 10.8* 11.9*  12.2* 9.5* 9.9* 9.9*  HCT 36.3*   < > 34.2* 35.0*  36.0* 29.5* 30.3* 30.8*  MCV 95.5  --  91.4  --   88.9 87.3 87.5  PLT 60*  --  52*  --  54* 67* 75*   < > = values in this interval not displayed.   Basic Metabolic Panel: Recent Labs  Lab 05/04/24 0333 05/04/24 0508 05/05/24 0447 05/06/24 0548 05/06/24 1241 05/07/24 0554 05/08/24 0430 05/09/24 0500 05/10/24 0345  NA 139   < > 134* 133* 138  136 133* 133* 133* 135  K 6.6*   < > 4.2 4.6 3.7  3.9 3.6 4.2 4.1 4.1  CL 102   < > 100 100  --  99 103 102 102  CO2 21*  --  24 21*  --  25 25 25 26   GLUCOSE 65*   < > 133* 114*  --  97 95 125* 108*  BUN 45*   < > 55* 66*  --  69* 74* 72* 56*  CREATININE 2.87*   < > 2.77* 2.83*  --  3.10* 3.09* 2.48* 1.78*  CALCIUM  9.8  --  8.8* 8.8*  --  8.8* 8.8* 8.8* 9.4  MG 2.1  --  2.1 2.0  --  1.9 2.3 2.4  --   PHOS 5.1*  --   --   --   --   --   --   --   --    < > = values in this interval not displayed.   GFR: Estimated Creatinine Clearance: 38.2 mL/min (A) (by C-G formula based on SCr of 1.78 mg/dL (H)). Liver Function Tests: Recent Labs  Lab 05/04/24 0333 05/05/24 0447 05/06/24 0929 05/07/24 0554  AST 3,966* 1,680* 696* 389*  ALT 2,269* 1,708* 1,188* 866*  ALKPHOS 89 82 81 74  BILITOT 3.8* 2.7* 2.3* 2.4*  PROT 7.1 6.0* 5.8* 5.4*  ALBUMIN  3.6 3.0* 2.8* 2.5*   No results for input(s): LIPASE, AMYLASE in the last 168 hours. No results for input(s): AMMONIA in the last 168 hours. Coagulation Profile: No results for input(s): INR, PROTIME in the last 168 hours. Cardiac Enzymes: No results for input(s): CKTOTAL, CKMB, CKMBINDEX, TROPONINI in the last 168 hours. BNP (last 3 results) No results for input(s): PROBNP in the last 8760 hours. HbA1C: No results for input(s): HGBA1C in the last 72 hours.  CBG: Recent Labs  Lab 05/09/24 0555 05/09/24 1117 05/09/24 1620 05/09/24 2210 05/10/24 0647  GLUCAP 130* 96 114* 126* 85   Lipid Profile: No results for input(s): CHOL, HDL, LDLCALC, TRIG, CHOLHDL, LDLDIRECT in the last 72 hours. Thyroid  Function Tests: No results for input(s): TSH, T4TOTAL, FREET4, T3FREE, THYROIDAB in the last 72 hours. Anemia Panel: No results for input(s): VITAMINB12, FOLATE, FERRITIN, TIBC, IRON, RETICCTPCT in the last 72 hours.  Urine analysis:    Component Value Date/Time   COLORURINE YELLOW 05/03/2024 2251   APPEARANCEUR HAZY (A) 05/03/2024 2251   LABSPEC >1.030 (H) 05/03/2024 2251   PHURINE 5.5 05/03/2024 2251   GLUCOSEU NEGATIVE 05/03/2024 2251   HGBUR LARGE (A) 05/03/2024 2251  BILIRUBINUR NEGATIVE 05/03/2024 2251   KETONESUR NEGATIVE 05/03/2024 2251   PROTEINUR >300 (A) 05/03/2024 2251   NITRITE NEGATIVE 05/03/2024 2251   LEUKOCYTESUR SMALL (A) 05/03/2024 2251   Sepsis Labs: @LABRCNTIP (procalcitonin:4,lacticidven:4)  ) Recent Results (from the past 240 hours)  Resp panel by RT-PCR (RSV, Flu A&B, Covid) Anterior Nasal Swab     Status: None   Collection Time: 05/02/24  6:23 AM   Specimen: Anterior Nasal Swab  Result Value Ref Range Status   SARS Coronavirus 2 by RT PCR NEGATIVE NEGATIVE Final   Influenza A by PCR NEGATIVE NEGATIVE Final   Influenza B by PCR NEGATIVE NEGATIVE Final    Comment: (NOTE) The Xpert Xpress SARS-CoV-2/FLU/RSV plus assay is intended as an aid in the diagnosis of influenza from Nasopharyngeal swab specimens and should not be used as a sole basis for treatment. Nasal washings and aspirates are unacceptable for Xpert Xpress SARS-CoV-2/FLU/RSV testing.  Fact Sheet for Patients: BloggerCourse.com  Fact Sheet for Healthcare Providers: SeriousBroker.it  This test is not yet approved or cleared by the [place]  FDA and has been authorized for detection and/or diagnosis of SARS-CoV-2 by FDA under an Emergency Use Authorization (EUA). This EUA will remain in effect (meaning this test can be used) for the duration of the COVID-19 declaration under Section 564(b)(1) of the Act, 21  U.S.C. section 360bbb-3(b)(1), unless the authorization is terminated or revoked.     Resp Syncytial Virus by PCR NEGATIVE NEGATIVE Final    Comment: (NOTE) Fact Sheet for Patients: BloggerCourse.com  Fact Sheet for Healthcare Providers: SeriousBroker.it  This test is not yet approved or cleared by the [place]  FDA and has been authorized for detection and/or diagnosis of SARS-CoV-2 by FDA under an Emergency Use Authorization (EUA). This EUA will remain in effect (meaning this test can be used) for the duration of the COVID-19 declaration under Section 564(b)(1) of the Act, 21 U.S.C. section 360bbb-3(b)(1), unless the authorization is terminated or revoked.  Performed at [facility], [address] [place], [place] [address]   Urine Culture     Status: Abnormal   Collection Time: 05/02/24  8:10 AM   Specimen: Urine, Random  Result Value Ref Range Status   Specimen Description URINE, RANDOM  Final   Special Requests   Final    NONE Reflexed from [id number] Performed at [facility], [address], [place], [place] [address]    Culture MULTIPLE SPECIES PRESENT, SUGGEST RECOLLECTION (A)  Final   Report Status 05/03/2024 FINAL  Final  Culture, blood (Routine X 2) w Reflex to ID Panel     Status: None   Collection Time: 05/04/24  1:10 AM   Specimen: BLOOD  Result Value Ref Range Status   Specimen Description BLOOD LEFT ANTECUBITAL  Final   Special Requests   Final    BOTTLES DRAWN AEROBIC AND ANAEROBIC Blood Culture adequate volume   Culture   Final    NO GROWTH 5 DAYS Performed at [facility], [address], [place], [place] [address]    Report Status 05/09/2024 FINAL  Final  Culture, blood (Routine X 2) w Reflex to ID Panel     Status: Abnormal   Collection Time: 05/04/24  3:32 AM   Specimen: BLOOD RIGHT HAND  Result Value Ref Range Status   Specimen Description BLOOD RIGHT HAND  Final   Special  Requests   Final    BOTTLES DRAWN AEROBIC AND ANAEROBIC Blood Culture results may not  be optimal due to an inadequate volume of blood received in culture bottles   Culture  Setup Time   Final    GRAM POSITIVE COCCI IN CLUSTERS ANAEROBIC BOTTLE ONLY CRITICAL RESULT CALLED TO, READ BACK BY AND VERIFIED WITH: PHARMD JESSICA MILLEN ON 05/05/24 @ 1846 BY DRT    Culture (A)  Final    STAPHYLOCOCCUS EPIDERMIDIS THE SIGNIFICANCE OF ISOLATING THIS ORGANISM FROM A SINGLE SET OF BLOOD CULTURES WHEN MULTIPLE SETS ARE DRAWN IS UNCERTAIN. PLEASE NOTIFY THE MICROBIOLOGY DEPARTMENT WITHIN ONE WEEK IF SPECIATION AND SENSITIVITIES ARE REQUIRED. Performed at [facility], [address] [place], [place] [address]    Report Status 05/07/2024 FINAL  Final  Blood Culture ID Panel (Reflexed)     Status: Abnormal   Collection Time: 05/04/24  3:32 AM  Result Value Ref Range Status   Enterococcus faecalis NOT DETECTED NOT DETECTED Final   Enterococcus Faecium NOT DETECTED NOT DETECTED Final   Listeria monocytogenes NOT DETECTED NOT DETECTED Final   Staphylococcus species DETECTED (A) NOT DETECTED Final    Comment: CRITICAL RESULT CALLED TO, READ BACK BY AND VERIFIED WITH: PHARMD JESSICA MILLEN ON 05/05/24 @ 1846 BY DRT    Staphylococcus aureus (BCID) NOT DETECTED NOT DETECTED Final   Staphylococcus epidermidis DETECTED (A) NOT DETECTED Final    Comment: Methicillin (oxacillin) resistant coagulase negative staphylococcus. Possible blood culture contaminant (unless isolated from more than one blood culture draw or clinical case suggests pathogenicity). No antibiotic treatment is indicated for blood  culture contaminants. CRITICAL RESULT CALLED TO, READ BACK BY AND VERIFIED WITH: PHARMD JESSICA MILLEN ON 05/05/24 @ 1846 BY DRT    Staphylococcus lugdunensis NOT DETECTED NOT DETECTED Final   Streptococcus species NOT DETECTED NOT DETECTED Final   Streptococcus agalactiae NOT DETECTED NOT DETECTED Final    Streptococcus pneumoniae NOT DETECTED NOT DETECTED Final   Streptococcus pyogenes NOT DETECTED NOT DETECTED Final   A.calcoaceticus-baumannii NOT DETECTED NOT DETECTED Final   Bacteroides fragilis NOT DETECTED NOT DETECTED Final   Enterobacterales NOT DETECTED NOT DETECTED Final   Enterobacter cloacae complex NOT DETECTED NOT DETECTED Final   Escherichia coli NOT DETECTED NOT DETECTED Final   Klebsiella aerogenes NOT DETECTED NOT DETECTED Final   Klebsiella oxytoca NOT DETECTED NOT DETECTED Final   Klebsiella pneumoniae NOT DETECTED NOT DETECTED Final   Proteus species NOT DETECTED NOT DETECTED Final   Salmonella species NOT DETECTED NOT DETECTED Final   Serratia marcescens NOT DETECTED NOT DETECTED Final   Haemophilus influenzae NOT DETECTED NOT DETECTED Final   Neisseria meningitidis NOT DETECTED NOT DETECTED Final   Pseudomonas aeruginosa NOT DETECTED NOT DETECTED Final   Stenotrophomonas maltophilia NOT DETECTED NOT DETECTED Final   Candida albicans NOT DETECTED NOT DETECTED Final   Candida auris NOT DETECTED NOT DETECTED Final   Candida glabrata NOT DETECTED NOT DETECTED Final   Candida krusei NOT DETECTED NOT DETECTED Final   Candida parapsilosis NOT DETECTED NOT DETECTED Final   Candida tropicalis NOT DETECTED NOT DETECTED Final   Cryptococcus neoformans/gattii NOT DETECTED NOT DETECTED Final   Methicillin resistance mecA/C DETECTED (A) NOT DETECTED Final    Comment: CRITICAL RESULT CALLED TO, READ BACK BY AND VERIFIED WITH: PHARMD JESSICA MILLEN ON 05/05/24 @ 1846 BY DRT Performed at [facility], [address], [place], [place] [address]   Culture, blood (Routine X 2) w Reflex to ID Panel     Status: None (Preliminary result)   Collection Time: 05/06/24  5:48 AM  Specimen: BLOOD  Result Value Ref Range Status   Specimen Description BLOOD BLOOD RIGHT HAND  Final   Special Requests   Final    BOTTLES DRAWN AEROBIC AND ANAEROBIC Blood Culture adequate volume   Culture    Final    NO GROWTH 4 DAYS Performed at [facility], [address], [place], [place] [address]    Report Status PENDING  Incomplete  Culture, blood (Routine X 2) w Reflex to ID Panel     Status: None (Preliminary result)   Collection Time: 05/06/24  5:49 AM   Specimen: BLOOD  Result Value Ref Range Status   Specimen Description BLOOD BLOOD LEFT HAND  Final   Special Requests   Final    BOTTLES DRAWN AEROBIC ONLY Blood Culture adequate volume   Culture   Final    NO GROWTH 4 DAYS Performed at [facility], [address], [place], [place] [address]    Report Status PENDING  Incomplete  Respiratory (~20 pathogens) panel by PCR     Status: Abnormal   Collection Time: 05/07/24  7:58 AM   Specimen: Nasopharyngeal Swab; Respiratory  Result Value Ref Range Status   Adenovirus NOT DETECTED NOT DETECTED Final   Coronavirus 229E NOT DETECTED NOT DETECTED Final    Comment: (NOTE) The Coronavirus on the Respiratory Panel, DOES NOT test for the novel  Coronavirus (2019 nCoV)    Coronavirus HKU1 NOT DETECTED NOT DETECTED Final   Coronavirus NL63 NOT DETECTED NOT DETECTED Final   Coronavirus OC43 NOT DETECTED NOT DETECTED Final   Metapneumovirus DETECTED (A) NOT DETECTED Final   Rhinovirus / Enterovirus NOT DETECTED NOT DETECTED Final   Influenza A NOT DETECTED NOT DETECTED Final   Influenza B NOT DETECTED NOT DETECTED Final   Parainfluenza Virus 1 NOT DETECTED NOT DETECTED Final   Parainfluenza Virus 2 NOT DETECTED NOT DETECTED Final   Parainfluenza Virus 3 NOT DETECTED NOT DETECTED Final   Parainfluenza Virus 4 NOT DETECTED NOT DETECTED Final   Respiratory Syncytial Virus NOT DETECTED NOT DETECTED Final   Bordetella pertussis NOT DETECTED NOT DETECTED Final   Bordetella Parapertussis NOT DETECTED NOT DETECTED Final   Chlamydophila pneumoniae NOT DETECTED NOT DETECTED Final   Mycoplasma pneumoniae NOT DETECTED NOT DETECTED Final    Comment: Performed at [facility]  Lab, [address] [place], [place] [address]     Radiology Studies: No results found.    Scheduled Meds:  amiodarone   200 mg Oral Daily   apixaban   5 mg Oral BID   atorvastatin   40 mg Oral Daily   Chlorhexidine  Gluconate Cloth  6 each Topical Daily   ferrous sulfate   325 mg Oral Daily   furosemide   40 mg Oral Daily   hydrALAZINE   25 mg Oral Q8H   insulin  aspart  0-6 Units Subcutaneous TID WC   isosorbide  mononitrate  30 mg Oral Daily   polyethylene glycol  17 g Oral Daily   Continuous Infusions:     LOS: 6 days    Time spent:    Sigurd Pac, MD [facility]   05/10/2024, 10:46 AM

## 2024-05-11 LAB — CULTURE, BLOOD (ROUTINE X 2)
Culture: NO GROWTH
Culture: NO GROWTH
Special Requests: ADEQUATE
Special Requests: ADEQUATE

## 2024-05-12 ENCOUNTER — Encounter

## 2024-05-14 NOTE — Progress Notes (Deleted)
 Cardiology Office Note    Date:  05/14/2024  ID:  Dean Salm Sr., DOB April 07, 1951, MRN [id number] PCP:  [name], MD  Cardiologist:  Vinie JAYSON Maxcy, MD  Electrophysiologist:  [name], MD   Chief Complaint: ***  History of Present Illness: .    Dean Ketcham Sr. is a 73 y.o. male with visit-pertinent history of chronic systolic heart failure/ischemic cardiomyopathy, left bundle branch block, history of VT s/p Medtronic single-lead ICD in 2018, CAD, hypertension, hyperlipidemia, type II DM, PAF, OSA on CPAP.  Patient had a VT arrest in 08/2016 that required CPR and defibrillation.  Echo on 09/07/2016 showed EF 35-40%, severe LAE, mild AI, mild MR.  Cardiac catheterization on 09/07/2016 demonstrated single-vessel CAD with 90% ostial RCA disease, 10 percent mid distal RCA occlusion with left-to-right collaterals.  Recommended medical management.  Underwent single-lead Medtronic ICD implantation on 09/2016.  Repeat echo was in 04/2018, 04/14/2019 and 05/14/2020 continue to show EF 30 to 35%.  ICD interrogation has since revealed paroxysmal atrial fibrillation and patient was started on Eliquis .  Patient was admitted in 07/2022 following an ICD shock.  Echo on 08/25/2022 showed EF 35 to 40%, akinesis of the inferolateral wall, grade 1 DD, mildly elevated PASP, moderate LAE, moderate MR, moderate AI.  Device interrogation revealed VT/V-fib with maximal heart rate of 231 bpm.  He had 2 aborted discharges followed by 1 successful discharge.  Hospital course was complicated by AKI on CKD, low magnesium .  He been out of his amiodarone  for 1 week and was reloaded with IV amiodarone .  Patient was seen by general cardiology in follow-up on 3//24.  At that time P was elevated and he was on exam and device function was stable.  Patient was seen by EP on 07/15/2023.  Patient was doing well at that time.  Patient was admitted from 2/22 - 11/20/2023.  He underwent colonoscopy with polypectomy  with hot snare with clips on 2/22.  He went home and had to hold his anticoagulation, then had a large amount of bright red blood in the toilet.  In the ED, hemoglobin was 6.7, he was transfused 1 unit and admitted.  He underwent repeat colonoscopy that showed adherent clot to the recent polypectomy site, after clot removal there was active bleeding so repeat clip and hemostatic gel were placed.  His ARB was held due to low blood pressure.  Eliquis  was held for 3 days postdischarge.  Following patient called the office after discharge with feelings of elevated heart rate, device check showed some elevated heart rate, likely related to GI bleeding. Optivol Thoracic impedance suggesting chronic possible fluid accumulation starting 06/2023.  Fluid index greater than normal threshold starting 10/24 .  Patient was seen in clinic on 3//25, at that point patient reported feeling weak and fatigued, he did not have any further issues with GI bleeding and was tolerating Eliquis  well.  Is felt that patient could possibly benefit from BiV pacing, was referred to EP to discuss a device upgrade.  ALT was elevated, so his Lipitor was held.  Valsartan  was resumed at 40 mg daily and Lasix  was decreased to 40 mg daily due to elevated creatinine.  Echocardiogram on/8/25 showed EF 30 to 35% with regional wall motion abnormalities, grade 2 DD, normal RV systolic function, severely elevated PA systolic pressure, severe MR, moderate to severe TR, mild to moderate AI.  At office visit on 01/17/2024 patient reported his main concern was his lower extremity swelling, he had swelling  in both of his feet.  He denies shortness of breath, abdominal distention, orthopnea.  He denied cough.  Patient had noted that his Lasix  had been increased a few days prior to office visit however felt that he had not returned to his baseline but did feel that he had had some improvement.  Patient was continued on increased dose of Lasix  for a few  further days then resumed 40 mg daily.  Patient was seen in clinic by Dr. Mona on 02/06/2024, noted that his LFTs had normalized and he was restarted on atorvastatin  20 mg daily and not restarted on ezetimibe .  Patient was admitted from 7/27 through 04/22/2024 with acute on chronic CHF, diuresed with IV Lasix .  Patient presented to the ED on 05/02/2024 with shortness of breath and dysuria, had recent treatment for UTI.  He was given IV Lasix  and treated for possible UTI.  He discharged home but returned the next day with worsening weakness, dyspnea and dysuria.  He is admitted with respiratory distress, AKI on CKD stage IIIb and concern for severe sepsis due to acute cystitis and acute on chronic CHF.  Labs significant for lactic acidosis, lactic acid up to 6.6, K6.6, AST 3966, ALT 2269, T. bili 3.8, serum creatinine 2.87.  High sensitive opponent mildly elevated with flat trend.  He was started on antibiotics and diuresis was attempted with IV Lasix .  Lactic acidosis and LFTs began to improve however he had poor response to IV diuretics.  Advanced heart failure team was consulted, patient was treated for UTI but suspected acute illness primarily due to cardiogenic shock.  Noted echo in 12/2023 indicated severe ischemic MR/moderate to severe TR.  Right heart cath indicated elevated filling pressures, Thermo CL 1.9, post cath diuresis IV Lasix  and started on milrinone .  Echocardiogram on 05/07/2024 indicated LVEF 35 to 40%, global hypokinesis mild concentric LVH, grade 2 diastolic dysfunction, RV systolic function mildly reduced, RV size mildly enlarged, moderately elevated pulmonary artery systolic pressures, LA was severely dilated, RA moderately dilated, moderate to severe mitral valve regurgitation, no evidence of mitral stenosis, moderate mitral annular calcification, tricuspid valve regurgitation is moderate, bicuspid aortic valve with moderate calcification of the aortic valve, regurgitation was mild.  No  stenosis present.  MRI reviewed by Dr. [name] and felt to be moderate. Patient's respiratory viral panel showed metapneumovirus infection.  Patient's milrinone  was stopped on 05/09/24.  Patient serum creatinine was improving however ARB was held at discharge.  He was not started on SGLT2 given UTI and no beta-blocker given recent shock.  Patient was discharged on 05/10/2024 on Lasix  40 mg daily, hydralazine  25 mg 3 times daily, Imdur  30 mg daily, amiodarone  200 mg every day, Eliquis  5 mg twice daily and atorvastatin  40 mg daily.  Today he presents for follow-up.  He reports that he  Chronic combined systolic and diastolic HF: EF around 30 to 40% since 2017.  Patient has chronic left bundle branch block, also in part likely ischemic given history of CAD.  Echo 09/2023 showed EF 30 to 35%, grade 2 DD, severe MR, moderate-severe TR.  Patient recently admitted with cardiogenic shock, RHC with elevated filling pressures, Thermo CL 1.9, post cath diuresed with IV Lasix  and was started on milrinone .  Echo during admission indicated EF 35 to 40%, RV mildly reduced, moderate to severe MR, moderate TR.  Milrinone  was stopped on 8/15.  Serum creatinine improved however ARB was held at discharge, no SGLT2 given recent UTI, no beta-blocker given recent shock. Today  patient reports that he Patient was discharged on Lasix  40 mg daily, hydralazine  25 mg 3 times daily, Imdur  30 mg daily, Amio 200 mg daily, Eliquis  5 mg twice daily and atorvastatin  40 mg daily.  Severe MR: On echo during recent admission noted to have moderate to severe MR with restricted posterior leaflet, moderate TR.  Patient will need TEE TEE?   PAF/History of VT: Previously diagnosed on patient's device, remained in sinus rhythm during admission and no atrial fibrillation noted on device check on 8/4. Continue amiodarone  20 mg daily Continue Eliquis  5 mg twice daily.  CAD: LHC in 2017 showed single vessel occlusive CAD with 90% ostial stenosis in the  RCA and 100% stenosis in the mid-distal RCA. There was collateral flow and patient was managed medically   Elevated LFTs: During recent admission AST peaked at 4000, ALT peaked at 2200, T. bili 3.8, felt to be likely shock liver. At discharge  HTN: Blood pressure today   HLD:   CKD stage IIIb:    Labwork independently reviewed:   ROS: .   *** denies chest pain, shortness of breath, lower extremity edema, fatigue, palpitations, melena, hematuria, hemoptysis, diaphoresis, weakness, presyncope, syncope, orthopnea, and PND.  All other systems are reviewed and otherwise negative.  Studies Reviewed: SABRA    EKG:  EKG is ordered today, personally reviewed, demonstrating ***     CV Studies: Cardiac studies reviewed are outlined and summarized above. Otherwise please see EMR for full report. Cardiac Studies & Procedures   ______________________________________________________________________________________________ CARDIAC CATHETERIZATION  CARDIAC CATHETERIZATION 05/06/2024  Conclusion Findings:  RA = 15 RV = 63/13 PA = 64/27 (39) PCW = 25 (v waves to 40) Fick cardiac output/index = 4.1/2.1 Thermo CO/CI = 3.6/1.8 PVR = 3.4 (Fick) 3.8 (TD) Ao sat = 97% PA sat = 51%, 53% PAPi = 2.5  Assessment: 1. Biventricular HF with elevated filling pressures and low cardiac output 2. Mixed moderate pulmonary HTN 3. Prominent v-waves in PCW tracing suggestive of significant MR  Plan/Discussion:  Start milrinone . Leave central line in to follow co-ox and CVP. Repeat echo. Diurese.  [name], MD 4:41 PM   CARDIAC CATHETERIZATION  CARDIAC CATHETERIZATION 09/07/2016  Conclusion  There is moderate left ventricular systolic dysfunction.  LV end diastolic pressure is normal.  The left ventricular ejection fraction is 35-45% by visual estimate.  Ost RCA lesion, 90 %stenosed.  Mid RCA to Dist RCA lesion, 100 %stenosed.  1. Single vessel occlusive CAD. The distal RCA is  occluded with left to right collaterals. There is a 90% ostial stenosis 2. Moderate LV dysfunction with EF 35-40%. Akinesis of the basal to mid inferior wall 3. Normal LVEDP  Plan: medical therapy. If he has significant angina we could consider PCI of the RCA but there appears to be significant scar of the inferior wall.  Findings Coronary Findings Diagnostic  Dominance: Right  Right Coronary Artery  Right Posterior Descending Artery Collaterals RPDA filled by collaterals from 2nd Sept.  Intervention  No interventions have been documented.     ECHOCARDIOGRAM  ECHOCARDIOGRAM COMPLETE 05/07/2024  Narrative ECHOCARDIOGRAM REPORT    Patient Name:   Dean Kleinman Sr. Date of Exam: 05/07/2024 Medical Rec #:  [id number]            Height:       66.0 in Accession #:    [id number]           Weight:       202.2 lb Date of Birth:  01-09-1951  BSA:          2.009 m Patient Age:    73 years             BP:           105/36 mmHg Patient Gender: M                    HR:           65 bpm. Exam Location:  Inpatient  Procedure: 2D Echo, Cardiac Doppler, Color Doppler and Intracardiac Opacification Agent (Both Spectral and Color Flow Doppler were utilized during procedure).  Indications:    CHF  History:        Patient has prior history of Echocardiogram examinations, most recent 08/25/2022. Cardiomyopathy, CAD, Arrythmias:Atrial Fibrillation; Risk Factors:Hypertension, Sleep Apnea and Diabetes.  Sonographer:    Ellouise Mose RDCS Referring Phys: [id number] BENJAMIN J STONER  IMPRESSIONS   1. Left ventricular ejection fraction, by estimation, is 35 to 40%. The left ventricle has moderately decreased function. The left ventricle demonstrates global hypokinesis. There is mild concentric left ventricular hypertrophy. Left ventricular diastolic parameters are consistent with Grade II diastolic dysfunction (pseudonormalization). 2. Right ventricular systolic function is mildly  reduced. The right ventricular size is mildly enlarged. There is moderately elevated pulmonary artery systolic pressure. The estimated right ventricular systolic pressure is 58.4 mmHg. 3. Left atrial size was severely dilated. 4. Right atrial size was moderately dilated. 5. The posterior leaflet is restricted. The mitral valve is abnormal. Moderate to severe mitral valve regurgitation. No evidence of mitral stenosis. Moderate mitral annular calcification. 6. Tricuspid valve regurgitation is moderate. 7. The aortic valve is bicuspid. There is moderate calcification of the aortic valve. Aortic valve regurgitation is mild. No aortic stenosis is present. 8. The inferior vena cava is dilated in size with >50% respiratory variability, suggesting right atrial pressure of 8 mmHg.  FINDINGS Left Ventricle: Left ventricular ejection fraction, by estimation, is 35 to 40%. The left ventricle has moderately decreased function. The left ventricle demonstrates global hypokinesis. Definity  contrast agent was given IV to delineate the left ventricular endocardial borders. The left ventricular internal cavity size was normal in size. There is mild concentric left ventricular hypertrophy. Left ventricular diastolic parameters are consistent with Grade II diastolic dysfunction (pseudonormalization).   LV Wall Scoring: The posterior wall is akinetic. The inferior wall is hypokinetic.  Right Ventricle: The right ventricular size is mildly enlarged. No increase in right ventricular wall thickness. Right ventricular systolic function is mildly reduced. There is moderately elevated pulmonary artery systolic pressure. The tricuspid regurgitant velocity is 3.55 m/s, and with an assumed right atrial pressure of 8 mmHg, the estimated right ventricular systolic pressure is 58.4 mmHg.  Left Atrium: Left atrial size was severely dilated.  Right Atrium: Right atrial size was moderately dilated.  Pericardium: There is no  evidence of pericardial effusion.  Mitral Valve: The posterior leaflet is restricted. The mitral valve is abnormal. There is mild calcification of the mitral valve leaflet(s). Moderate mitral annular calcification. Moderate to severe mitral valve regurgitation, with centrally-directed jet. No evidence of mitral valve stenosis.  Tricuspid Valve: The tricuspid valve is normal in structure. Tricuspid valve regurgitation is moderate . No evidence of tricuspid stenosis.  Aortic Valve: The aortic valve is bicuspid. There is moderate calcification of the aortic valve. Aortic valve regurgitation is mild. No aortic stenosis is present. Aortic valve mean gradient measures 10.0 mmHg. Aortic valve peak gradient measures 17.8 mmHg. Aortic valve area, by  VTI measures 2.96 cm.  Pulmonic Valve: The pulmonic valve was normal in structure. Pulmonic valve regurgitation is not visualized. No evidence of pulmonic stenosis.  Aorta: The aortic root is normal in size and structure.  Venous: The inferior vena cava is dilated in size with greater than 50% respiratory variability, suggesting right atrial pressure of 8 mmHg.  IAS/Shunts: No atrial level shunt detected by color flow Doppler.  Additional Comments: A device lead is visualized.   LEFT VENTRICLE PLAX 2D LVIDd:         5.70 cm   Diastology LVIDs:         4.80 cm   LV e' medial:    4.80 cm/s LV PW:         1.60 cm   LV E/e' medial:  30.4 LV IVS:        1.30 cm   LV e' lateral:   9.46 cm/s LVOT diam:     2.70 cm   LV E/e' lateral: 15.4 LV SV:         1517 LV SV Index:   755 LVOT Area:     5.73 cm   RIGHT VENTRICLE              IVC RV Basal diam:  4.58 cm      IVC diam: 2.30 cm RV Mid diam:    3.68 cm RV Length:      9.58 cm RV S prime:     148.00 cm/s  LEFT ATRIUM           Index LA diam:      5.50 cm 2.74 cm/m LA Vol (A2C): 95.7 ml 47.64 ml/m LA Vol (A4C): 88.7 ml 44.15 ml/m AORTIC VALVE AV Area (Vmax):    3.20 cm AV Area (VTI):      2.96 cm AV Vmax:           211.00 cm/s AV VTI:            5.130 m AV Peak Grad:      17.8 mmHg AV Mean Grad:      10.0 mmHg LVOT Vmax:         118.00 cm/s LVOT Vmean:        813.000 cm/s LVOT VTI:          2.650 m LVOT/AV VTI ratio: 0.52  AORTA Ao Root diam: 3.30 cm Ao Asc diam:  3.10 cm  MITRAL VALVE                   TRICUSPID VALVE MV Area VTI: 3.19 cm          TR Peak grad:   50.4 mmHg MV VTI:      4.75 m            TR Vmax:        355.00 cm/s MR Peak grad:      94.9 mmHg MR Mean grad:      53.0 mmHg   SHUNTS MR Vmax:           487.00 cm/s Systemic VTI:  2.65 m MR Vmean:          335.0 cm/s  Systemic Diam: 2.70 cm MR Vena Contracta: 0.60 cm MV E velocity: 146.00 cm/s MV A velocity: 105.00 cm/s MV E/A ratio:  1.39  [name] MD Electronically signed by [name] MD Signature Date/Time: 05/08/2024/6:14:56 AM    Final          ______________________________________________________________________________________________  Current Reported Medications:.    No outpatient medications have been marked as taking for the 05/15/24 encounter (Appointment) with Hamdi Vari D, NP.    Physical Exam:    VS:  There were no vitals taken for this visit.   Wt Readings from Last 3 Encounters:  05/09/24 198 lb (89.8 kg)  05/02/24 195 lb (88.5 kg)  04/22/24 198 lb 1.6 oz (89.9 kg)    GEN: Well nourished, well developed in no acute distress NECK: No JVD; No carotid bruits CARDIAC: ***RRR, no murmurs, rubs, gallops RESPIRATORY:  Clear to auscultation without rales, wheezing or rhonchi  ABDOMEN: Soft, non-tender, non-distended EXTREMITIES:  No edema; No acute deformity     Asessement and Plan:.     ***     Disposition: F/u with ***  Signed, Jaimey Franchini D Jerren Flinchbaugh, NP

## 2024-05-15 ENCOUNTER — Ambulatory Visit (INDEPENDENT_AMBULATORY_CARE_PROVIDER_SITE_OTHER): Payer: Self-pay

## 2024-05-15 ENCOUNTER — Ambulatory Visit: Payer: Self-pay | Admitting: Cardiology

## 2024-05-15 ENCOUNTER — Ambulatory Visit: Admitting: Cardiology

## 2024-05-15 DIAGNOSIS — E785 Hyperlipidemia, unspecified: Secondary | ICD-10-CM

## 2024-05-15 DIAGNOSIS — I340 Nonrheumatic mitral (valve) insufficiency: Secondary | ICD-10-CM

## 2024-05-15 DIAGNOSIS — I482 Chronic atrial fibrillation, unspecified: Secondary | ICD-10-CM

## 2024-05-15 DIAGNOSIS — N183 Chronic kidney disease, stage 3 unspecified: Secondary | ICD-10-CM

## 2024-05-15 DIAGNOSIS — I5022 Chronic systolic (congestive) heart failure: Secondary | ICD-10-CM

## 2024-05-15 DIAGNOSIS — I255 Ischemic cardiomyopathy: Secondary | ICD-10-CM

## 2024-05-15 DIAGNOSIS — I480 Paroxysmal atrial fibrillation: Secondary | ICD-10-CM

## 2024-05-15 DIAGNOSIS — Z95810 Presence of automatic (implantable) cardiac defibrillator: Secondary | ICD-10-CM

## 2024-05-15 LAB — CUP PACEART REMOTE DEVICE CHECK
Battery Remaining Longevity: 33 mo
Battery Voltage: 2.96 V
Brady Statistic RV Percent Paced: 0.01 %
Date Time Interrogation Session: 20250821042303
HighPow Impedance: 38 Ohm
Implantable Lead Connection Status: 753985
Implantable Lead Implant Date: 20180116
Implantable Lead Location: 753860
Implantable Lead Model: 6935
Implantable Pulse Generator Implant Date: 20180116
Lead Channel Impedance Value: 247 Ohm
Lead Channel Impedance Value: 323 Ohm
Lead Channel Pacing Threshold Amplitude: 1 V
Lead Channel Pacing Threshold Pulse Width: 0.4 ms
Lead Channel Sensing Intrinsic Amplitude: 9.25 mV
Lead Channel Sensing Intrinsic Amplitude: 9.25 mV
Lead Channel Setting Pacing Amplitude: 2.5 V
Lead Channel Setting Pacing Pulse Width: 0.4 ms
Lead Channel Setting Sensing Sensitivity: 0.3 mV
Zone Setting Status: 755011
Zone Setting Status: 755011

## 2024-05-15 NOTE — Progress Notes (Unsigned)
 Cardiology Office Note    Date:  05/16/2024  ID:  Dean Groene Sr., DOB 07-12-1951, MRN [id number] PCP:  [name], MD  Cardiologist:  Vinie JAYSON Maxcy, MD  Electrophysiologist:  [name], MD   Chief Complaint: Follow up for HFrEF   History of Present Illness: .    Dean Gearheart Sr. is a 73 y.o. male with visit-pertinent history of chronic systolic heart failure/ischemic cardiomyopathy, left bundle branch block, history of VT s/p Medtronic single-lead ICD in 2018, CAD, hypertension, hyperlipidemia, type II DM, PAF, OSA on CPAP.  Patient had a VT arrest in 08/2016 that required CPR and defibrillation.  Echo on 09/07/2016 showed EF 35-40%, severe LAE, mild AI, mild MR.  Cardiac catheterization on 09/07/2016 demonstrated single-vessel CAD with 90% ostial RCA disease, 10 percent mid distal RCA occlusion with left-to-right collaterals.  Recommended medical management.  Underwent single-lead Medtronic ICD implantation on 09/2016.  Repeat echo was in 04/2018, 04/14/2019 and 05/14/2020 continue to show EF 30 to 35%.  ICD interrogation has since revealed paroxysmal atrial fibrillation and patient was started on Eliquis .  Patient was admitted in 07/2022 following an ICD shock.  Echo on 08/25/2022 showed EF 35 to 40%, akinesis of the inferolateral wall, grade 1 DD, mildly elevated PASP, moderate LAE, moderate MR, moderate AI.  Device interrogation revealed VT/V-fib with maximal heart rate of 231 bpm.  He had 2 aborted discharges followed by 1 successful discharge.  Hospital course was complicated by AKI on CKD, low magnesium .  He been out of his amiodarone  for 1 week and was reloaded with IV amiodarone .  Patient was seen by general cardiology in follow-up on 3//24.  At that time P was elevated and he was on exam and device function was stable.  Patient was seen by EP on 07/15/2023.  Patient was doing well at that time.  Patient was admitted from 2/22 - 11/20/2023.  He underwent colonoscopy  with polypectomy with hot snare with clips on 2/22.  He went home and had to hold his anticoagulation, then had a large amount of bright red blood in the toilet.  In the ED, hemoglobin was 6.7, he was transfused 1 unit and admitted.  He underwent repeat colonoscopy that showed adherent clot to the recent polypectomy site, after clot removal there was active bleeding so repeat clip and hemostatic gel were placed.  His ARB was held due to low blood pressure.  Eliquis  was held for 3 days postdischarge.  Following patient called the office after discharge with feelings of elevated heart rate, device check showed some elevated heart rate, likely related to GI bleeding. Optivol Thoracic impedance suggesting chronic possible fluid accumulation starting 06/2023.  Fluid index greater than normal threshold starting 10/24 .  Patient was seen in clinic on 3//25, at that point patient reported feeling weak and fatigued, he did not have any further issues with GI bleeding and was tolerating Eliquis  well.  Is felt that patient could possibly benefit from BiV pacing, was referred to EP to discuss a device upgrade.  ALT was elevated, so his Lipitor was held.  Valsartan  was resumed at 40 mg daily and Lasix  was decreased to 40 mg daily due to elevated creatinine.  Echocardiogram on/8/25 showed EF 30 to 35% with regional wall motion abnormalities, grade 2 DD, normal RV systolic function, severely elevated PA systolic pressure, severe MR, moderate to severe TR, mild to moderate AI.  At office visit on 01/17/2024 patient reported his main concern was his lower extremity  swelling, he had swelling in both of his feet.  He denies shortness of breath, abdominal distention, orthopnea.  He denied cough.  Patient had noted that his Lasix  had been increased a few days prior to office visit however felt that he had not returned to his baseline but did feel that he had had some improvement.  Patient was continued on increased dose of  Lasix  for a few further days then resumed 40 mg daily.  Patient was seen in clinic by Dr. Mona on 02/06/2024, noted that his LFTs had normalized and he was restarted on atorvastatin  20 mg daily and not restarted on ezetimibe .  Patient was admitted from 7/27 through 04/22/2024 with acute on chronic CHF, diuresed with IV Lasix .  Patient presented to the ED on 05/02/2024 with shortness of breath and dysuria, had recent treatment for UTI.  He was given IV Lasix  and treated for possible UTI.  He discharged home but returned the next day with worsening weakness, dyspnea and dysuria.  He is admitted with respiratory distress, AKI on CKD stage IIIb and concern for severe sepsis due to acute cystitis and acute on chronic CHF.  Labs significant for lactic acidosis, lactic acid up to 6.6, K6.6, AST 3966, ALT 2269, T. bili 3.8, serum creatinine 2.87.  High sensitive opponent mildly elevated with flat trend.  He was started on antibiotics and diuresis was attempted with IV Lasix .  Lactic acidosis and LFTs began to improve however he had poor response to IV diuretics.  Advanced heart failure team was consulted, patient was treated for UTI but suspected acute illness primarily due to cardiogenic shock.  Noted echo in 12/2023 indicated severe ischemic MR/moderate to severe TR.  Right heart cath indicated elevated filling pressures, Thermo CL 1.9, post cath diuresis IV Lasix  and started on milrinone .  Echocardiogram on 05/07/2024 indicated LVEF 35 to 40%, global hypokinesis mild concentric LVH, grade 2 diastolic dysfunction, RV systolic function mildly reduced, RV size mildly enlarged, moderately elevated pulmonary artery systolic pressures, LA was severely dilated, RA moderately dilated, moderate to severe mitral valve regurgitation, no evidence of mitral stenosis, moderate mitral annular calcification, tricuspid valve regurgitation is moderate, bicuspid aortic valve with moderate calcification of the aortic valve, regurgitation was  mild.  No stenosis present. Patient's respiratory viral panel showed metapneumovirus infection.  Patient's milrinone  was stopped on 05/09/24.  Patient serum creatinine was improving however ARB was held at discharge.  He was not started on SGLT2 given UTI and no beta-blocker given recent shock.  Patient was discharged on 05/10/2024 on Lasix  40 mg daily, hydralazine  25 mg 3 times daily, Imdur  30 mg daily, amiodarone  200 mg every day, Eliquis  5 mg twice daily and atorvastatin  40 mg daily.  Today he presents for follow-up.  He reports that he is doing well overall.  He does note increased fatigue related to his recent hospital admission, feels that this is somewhat improving.  He also endorses dyspnea on exertion, lower extremity edema, he feels that this is stable from when he was in the hospital, does not feel that this has worsened.  Patient denies any chest pain.  He denies any presyncope or syncope.  Patient notes problems with ongoing cough and congestion related to metapneumovirus infection, notes that cough is rather annoying and is exacerbated by ongoing talking.  He reports that his weights have been stable at home following discharge.  ROS: .   Today he denies chest pain, palpitations, melena, hematuria, hemoptysis, diaphoresis, weakness, presyncope, syncope, orthopnea, and PND.  All other systems are reviewed and otherwise negative. Studies Reviewed: SABRA   EKG:  EKG is not ordered today.  CV Studies: Cardiac studies reviewed are outlined and summarized above. Otherwise please see EMR for full report. Cardiac Studies & Procedures   ______________________________________________________________________________________________ CARDIAC CATHETERIZATION  CARDIAC CATHETERIZATION 05/06/2024  Conclusion Findings:  RA = 15 RV = 63/13 PA = 64/27 (39) PCW = 25 (v waves to 40) Fick cardiac output/index = 4.1/2.1 Thermo CO/CI = 3.6/1.8 PVR = 3.4 (Fick) 3.8 (TD) Ao sat = 97% PA sat = 51%, 53% PAPi =  2.5  Assessment: 1. Biventricular HF with elevated filling pressures and low cardiac output 2. Mixed moderate pulmonary HTN 3. Prominent v-waves in PCW tracing suggestive of significant MR  Plan/Discussion:  Start milrinone . Leave central line in to follow co-ox and CVP. Repeat echo. Diurese.  [name], MD 4:41 PM   CARDIAC CATHETERIZATION  CARDIAC CATHETERIZATION 09/07/2016  Conclusion  There is moderate left ventricular systolic dysfunction.  LV end diastolic pressure is normal.  The left ventricular ejection fraction is 35-45% by visual estimate.  Ost RCA lesion, 90 %stenosed.  Mid RCA to Dist RCA lesion, 100 %stenosed.  1. Single vessel occlusive CAD. The distal RCA is occluded with left to right collaterals. There is a 90% ostial stenosis 2. Moderate LV dysfunction with EF 35-40%. Akinesis of the basal to mid inferior wall 3. Normal LVEDP  Plan: medical therapy. If he has significant angina we could consider PCI of the RCA but there appears to be significant scar of the inferior wall.  Findings Coronary Findings Diagnostic  Dominance: Right  Right Coronary Artery  Right Posterior Descending Artery Collaterals RPDA filled by collaterals from 2nd Sept.  Intervention  No interventions have been documented.     ECHOCARDIOGRAM  ECHOCARDIOGRAM COMPLETE 05/07/2024  Narrative ECHOCARDIOGRAM REPORT    Patient Name:   Dean Dills Sr. Date of Exam: 05/07/2024 Medical Rec #:  [id number]            Height:       66.0 in Accession #:    [id number]           Weight:       202.2 lb Date of Birth:  10-19-1950             BSA:          2.009 m Patient Age:    73 years             BP:           105/36 mmHg Patient Gender: M                    HR:           65 bpm. Exam Location:  Inpatient  Procedure: 2D Echo, Cardiac Doppler, Color Doppler and Intracardiac Opacification Agent (Both Spectral and Color Flow Doppler were utilized during  procedure).  Indications:    CHF  History:        Patient has prior history of Echocardiogram examinations, most recent 08/25/2022. Cardiomyopathy, CAD, Arrythmias:Atrial Fibrillation; Risk Factors:Hypertension, Sleep Apnea and Diabetes.  Sonographer:    Ellouise Mose RDCS Referring Phys: [id number] BENJAMIN J STONER  IMPRESSIONS   1. Left ventricular ejection fraction, by estimation, is 35 to 40%. The left ventricle has moderately decreased function. The left ventricle demonstrates global hypokinesis. There is mild concentric left ventricular hypertrophy. Left ventricular diastolic parameters are consistent with Grade II diastolic dysfunction (pseudonormalization). 2. Right  ventricular systolic function is mildly reduced. The right ventricular size is mildly enlarged. There is moderately elevated pulmonary artery systolic pressure. The estimated right ventricular systolic pressure is 58.4 mmHg. 3. Left atrial size was severely dilated. 4. Right atrial size was moderately dilated. 5. The posterior leaflet is restricted. The mitral valve is abnormal. Moderate to severe mitral valve regurgitation. No evidence of mitral stenosis. Moderate mitral annular calcification. 6. Tricuspid valve regurgitation is moderate. 7. The aortic valve is bicuspid. There is moderate calcification of the aortic valve. Aortic valve regurgitation is mild. No aortic stenosis is present. 8. The inferior vena cava is dilated in size with >50% respiratory variability, suggesting right atrial pressure of 8 mmHg.  FINDINGS Left Ventricle: Left ventricular ejection fraction, by estimation, is 35 to 40%. The left ventricle has moderately decreased function. The left ventricle demonstrates global hypokinesis. Definity  contrast agent was given IV to delineate the left ventricular endocardial borders. The left ventricular internal cavity size was normal in size. There is mild concentric left ventricular hypertrophy. Left ventricular  diastolic parameters are consistent with Grade II diastolic dysfunction (pseudonormalization).   LV Wall Scoring: The posterior wall is akinetic. The inferior wall is hypokinetic.  Right Ventricle: The right ventricular size is mildly enlarged. No increase in right ventricular wall thickness. Right ventricular systolic function is mildly reduced. There is moderately elevated pulmonary artery systolic pressure. The tricuspid regurgitant velocity is 3.55 m/s, and with an assumed right atrial pressure of 8 mmHg, the estimated right ventricular systolic pressure is 58.4 mmHg.  Left Atrium: Left atrial size was severely dilated.  Right Atrium: Right atrial size was moderately dilated.  Pericardium: There is no evidence of pericardial effusion.  Mitral Valve: The posterior leaflet is restricted. The mitral valve is abnormal. There is mild calcification of the mitral valve leaflet(s). Moderate mitral annular calcification. Moderate to severe mitral valve regurgitation, with centrally-directed jet. No evidence of mitral valve stenosis.  Tricuspid Valve: The tricuspid valve is normal in structure. Tricuspid valve regurgitation is moderate . No evidence of tricuspid stenosis.  Aortic Valve: The aortic valve is bicuspid. There is moderate calcification of the aortic valve. Aortic valve regurgitation is mild. No aortic stenosis is present. Aortic valve mean gradient measures 10.0 mmHg. Aortic valve peak gradient measures 17.8 mmHg. Aortic valve area, by VTI measures 2.96 cm.  Pulmonic Valve: The pulmonic valve was normal in structure. Pulmonic valve regurgitation is not visualized. No evidence of pulmonic stenosis.  Aorta: The aortic root is normal in size and structure.  Venous: The inferior vena cava is dilated in size with greater than 50% respiratory variability, suggesting right atrial pressure of 8 mmHg.  IAS/Shunts: No atrial level shunt detected by color flow Doppler.  Additional  Comments: A device lead is visualized.   LEFT VENTRICLE PLAX 2D LVIDd:         5.70 cm   Diastology LVIDs:         4.80 cm   LV e' medial:    4.80 cm/s LV PW:         1.60 cm   LV E/e' medial:  30.4 LV IVS:        1.30 cm   LV e' lateral:   9.46 cm/s LVOT diam:     2.70 cm   LV E/e' lateral: 15.4 LV SV:         1517 LV SV Index:   755 LVOT Area:     5.73 cm   RIGHT VENTRICLE  IVC RV Basal diam:  4.58 cm      IVC diam: 2.30 cm RV Mid diam:    3.68 cm RV Length:      9.58 cm RV S prime:     148.00 cm/s  LEFT ATRIUM           Index LA diam:      5.50 cm 2.74 cm/m LA Vol (A2C): 95.7 ml 47.64 ml/m LA Vol (A4C): 88.7 ml 44.15 ml/m AORTIC VALVE AV Area (Vmax):    3.20 cm AV Area (VTI):     2.96 cm AV Vmax:           211.00 cm/s AV VTI:            5.130 m AV Peak Grad:      17.8 mmHg AV Mean Grad:      10.0 mmHg LVOT Vmax:         118.00 cm/s LVOT Vmean:        813.000 cm/s LVOT VTI:          2.650 m LVOT/AV VTI ratio: 0.52  AORTA Ao Root diam: 3.30 cm Ao Asc diam:  3.10 cm  MITRAL VALVE                   TRICUSPID VALVE MV Area VTI: 3.19 cm          TR Peak grad:   50.4 mmHg MV VTI:      4.75 m            TR Vmax:        355.00 cm/s MR Peak grad:      94.9 mmHg MR Mean grad:      53.0 mmHg   SHUNTS MR Vmax:           487.00 cm/s Systemic VTI:  2.65 m MR Vmean:          335.0 cm/s  Systemic Diam: 2.70 cm MR Vena Contracta: 0.60 cm MV E velocity: 146.00 cm/s MV A velocity: 105.00 cm/s MV E/A ratio:  1.39  [name] MD Electronically signed by [name] MD Signature Date/Time: 05/08/2024/6:14:56 AM    Final          ______________________________________________________________________________________________       Current Reported Medications:.    Current Meds  Medication Sig   allopurinol  (ZYLOPRIM ) 100 MG tablet Take 1 tablet (100 mg total) by mouth daily.   amiodarone  (PACERONE ) 200 MG tablet Take 1 tablet (200 mg  total) by mouth daily.   apixaban  (ELIQUIS ) 5 MG TABS tablet TAKE 1 TABLET BY MOUTH TWICE A DAY   atorvastatin  (LIPITOR) 40 MG tablet Take 40 mg by mouth daily.   cholecalciferol  (VITAMIN D3) 25 MCG (1000 UNIT) tablet Take 1,000 Units by mouth daily.   ferrous sulfate  325 (65 FE) MG tablet Take 325 mg by mouth daily.   furosemide  (LASIX ) 40 MG tablet Take 1 tablet (40 mg total) by mouth daily.   guaiFENesin -codeine  100-10 MG/5ML syrup Take 5 mLs by mouth at bedtime as needed for cough.   hydrALAZINE  (APRESOLINE ) 25 MG tablet Take 1 tablet (25 mg total) by mouth every 8 (eight) hours.   isosorbide  mononitrate (IMDUR ) 30 MG 24 hr tablet Take 1 tablet (30 mg total) by mouth daily.    Physical Exam:    VS:  BP (!) 114/58   Pulse 69   Ht 5' 6 (1.676 m)   Wt 197 lb 6.4 oz (89.5 kg)  SpO2 100%   BMI 31.86 kg/m    Wt Readings from Last 3 Encounters:  05/16/24 197 lb 6.4 oz (89.5 kg)  05/09/24 198 lb (89.8 kg)  05/02/24 195 lb (88.5 kg)    GEN: Well nourished, well developed in no acute distress NECK: No JVD; No carotid bruits CARDIAC: RRR, no murmurs, rubs, gallops RESPIRATORY:  Clear to auscultation without rales, wheezing or rhonchi  ABDOMEN: Soft, non-tender, non-distended EXTREMITIES:  No edema; No acute deformity     Asessement and Plan:.    Chronic combined systolic and diastolic HF: EF around 30 to 40% since 2017.  Patient has chronic left bundle branch block, also in part likely ischemic given history of CAD.  Echo 09/2023 showed EF 30 to 35%, grade 2 DD, severe MR, moderate-severe TR.  Patient recently admitted with cardiogenic shock, RHC with elevated filling pressures, Thermo CL 1.9, post cath diuresed with IV Lasix  and was started on milrinone .  Echo during admission indicated EF 35 to 40%, RV mildly reduced, moderate to severe MR, moderate TR.  Milrinone  was stopped on 8/15.  Serum creatinine improved however ARB was held at discharge, no SGLT2 given recent UTI, no  beta-blocker given recent shock.  Plans for TEE for severe MR once recovered from metapneumovirus infection. Today patient reports that he feels he is stable, continues to note dyspnea on exertion however feels this has not recently changed, patient reports that his weights at home have been stable.  He does note ongoing lower extremity edema, on exam has +1 bilateral ankle edema, he reports that he feels this is his baseline.  Patient continues to endorse cough and nasal congestion. Patient reports he thought he was to follow-up with advanced heart failure clinic however has not received a call to schedule an appointment, will refer to advanced heart failure clinic given recent cardiogenic shock and need for milrinone .  Continue Lasix  40 mg daily, hydralazine  25 mg 3 times daily, Imdur  30 mg daily, and amiodarone  200 mg daily. Check CMET and BNP today.   Severe MR: On echo during recent admission noted to have moderate to severe MR with restricted posterior leaflet, moderate TR. Patient endorses dyspnea on exertion, reports that this has been stable, denies any significant changes.  He denies chest pain.  Patient will need TEE however he continues to note significant coughing and congestion related to recent metapneumovirus infection, will defer TEE for now until he has recovered.  PAF/History of VT: Previously diagnosed on patient's device, remained in sinus rhythm during admission and no atrial fibrillation noted on device check on 8/4.  Patient denies any palpitations or feeling of increased heart rates. Continue amiodarone  200 mg daily. Continue Eliquis  5 mg twice daily.  CAD: LHC in 2017 showed single vessel occlusive CAD with 90% ostial stenosis in the RCA and 100% stenosis in the mid-distal RCA. There was collateral flow and patient was managed medically.  Patient denies any chest pain, notes some dyspnea on exertion that he reports is stable.  Continue Eliquis  5 mg twice daily, Lipitor 40 mg daily,  Lasix  40 mg daily, hydralazine  25 mg every 8 hours and Imdur  30 mg daily.  Elevated LFTs: During recent admission AST peaked at 4000, ALT peaked at 2200, T. bili 3.8, felt to be likely shock liver. At discharge patient's atorvastatin  was resumed.  Check c-Met today.  HTN: Blood pressure today 114/58, continue current antihypertensive regimen.  CKD stage IIIb: Last creatinine 1.78 on 05/10/2024.  Check c-Met today.  Patient  reports that he is to follow-up with nephrology next week.   Disposition: F/u with advanced heart failure clinic, Candelaria Pies, NP in 6-8 weeks.   Signed, Halayna Blane D Lorayne Getchell, NP

## 2024-05-16 ENCOUNTER — Ambulatory Visit: Attending: Cardiology | Admitting: Cardiology

## 2024-05-16 ENCOUNTER — Encounter: Payer: Self-pay | Admitting: Cardiology

## 2024-05-16 VITALS — BP 114/58 | HR 69 | Ht 66.0 in | Wt 197.4 lb

## 2024-05-16 DIAGNOSIS — Z95810 Presence of automatic (implantable) cardiac defibrillator: Secondary | ICD-10-CM | POA: Diagnosis not present

## 2024-05-16 DIAGNOSIS — I340 Nonrheumatic mitral (valve) insufficiency: Secondary | ICD-10-CM | POA: Diagnosis not present

## 2024-05-16 DIAGNOSIS — I255 Ischemic cardiomyopathy: Secondary | ICD-10-CM

## 2024-05-16 DIAGNOSIS — I10 Essential (primary) hypertension: Secondary | ICD-10-CM

## 2024-05-16 DIAGNOSIS — I5022 Chronic systolic (congestive) heart failure: Secondary | ICD-10-CM

## 2024-05-16 DIAGNOSIS — I480 Paroxysmal atrial fibrillation: Secondary | ICD-10-CM

## 2024-05-16 DIAGNOSIS — I472 Ventricular tachycardia, unspecified: Secondary | ICD-10-CM

## 2024-05-16 DIAGNOSIS — I2510 Atherosclerotic heart disease of native coronary artery without angina pectoris: Secondary | ICD-10-CM

## 2024-05-16 NOTE — Patient Instructions (Signed)
 Medication Instructions:  No changes *If you need a refill on your cardiac medications before your next appointment, please call your pharmacy*  Labs: We are going to draw a Cmet and BNP If you have labs (blood work) drawn today and your tests are completely normal, you will receive your results only by: MyChart Message (if you have MyChart) OR A paper copy in the mail If you have any lab test that is abnormal or we need to change your treatment, we will call you to review the results.  Testing/Procedures: No testing  Follow-Up: At [facility], you and your health needs are our priority.  As part of our continuing mission to provide you with exceptional heart care, our providers are all part of one team.  This team includes your primary Cardiologist (physician) and Advanced Practice Providers or APPs (Physician Assistants and Nurse Practitioners) who all work together to provide you with the care you need, when you need it.  Your next appointment:   Depends on Heart failure  Provider:   Vinie JAYSON Maxcy, MD Or Katlyn West NP   Other Appointments: We are going to refer you to the heart failure.  We recommend signing up for the patient portal called MyChart.  Sign up information is provided on this After Visit Summary.  MyChart is used to connect with patients for Virtual Visits (Telemedicine).  Patients are able to view lab/test results, encounter notes, upcoming appointments, etc.  Non-urgent messages can be sent to your provider as well.   To learn more about what you can do with MyChart, go to [email or web address].

## 2024-05-19 ENCOUNTER — Ambulatory Visit: Attending: Cardiology

## 2024-05-19 ENCOUNTER — Ambulatory Visit: Payer: Self-pay | Admitting: Cardiology

## 2024-05-19 ENCOUNTER — Ambulatory Visit: Admitting: Cardiology

## 2024-05-19 DIAGNOSIS — I5022 Chronic systolic (congestive) heart failure: Secondary | ICD-10-CM

## 2024-05-19 DIAGNOSIS — I255 Ischemic cardiomyopathy: Secondary | ICD-10-CM

## 2024-05-19 DIAGNOSIS — I340 Nonrheumatic mitral (valve) insufficiency: Secondary | ICD-10-CM

## 2024-05-19 DIAGNOSIS — Z95810 Presence of automatic (implantable) cardiac defibrillator: Secondary | ICD-10-CM

## 2024-05-19 LAB — COMPREHENSIVE METABOLIC PANEL WITH GFR
ALT: 110 IU/L — ABNORMAL HIGH (ref 0–44)
AST: 32 IU/L (ref 0–40)
Albumin: 3.5 g/dL — ABNORMAL LOW (ref 3.8–4.8)
Alkaline Phosphatase: 108 IU/L (ref 44–121)
BUN/Creatinine Ratio: 21 (ref 10–24)
BUN: 55 mg/dL — ABNORMAL HIGH (ref 8–27)
Bilirubin Total: 2 mg/dL — ABNORMAL HIGH (ref 0.0–1.2)
CO2: 17 mmol/L — ABNORMAL LOW (ref 20–29)
Calcium: 9.9 mg/dL (ref 8.6–10.2)
Chloride: 101 mmol/L (ref 96–106)
Creatinine, Ser: 2.65 mg/dL — ABNORMAL HIGH (ref 0.76–1.27)
Globulin, Total: 3.1 g/dL (ref 1.5–4.5)
Glucose: 78 mg/dL (ref 70–99)
Potassium: 5.4 mmol/L — ABNORMAL HIGH (ref 3.5–5.2)
Sodium: 137 mmol/L (ref 134–144)
Total Protein: 6.6 g/dL (ref 6.0–8.5)
eGFR: 25 mL/min/1.73 — ABNORMAL LOW (ref >59)

## 2024-05-19 LAB — BRAIN NATRIURETIC PEPTIDE: BNP: 618.2 pg/mL — AB (ref 0.0–100.0)

## 2024-05-20 NOTE — Progress Notes (Signed)
 EPIC Encounter for ICM Monitoring  Patient Name: Dean [name]. is a 73 y.o. male Date: 05/20/2024 Primary Care Physican: [name], MD Primary Cardiologist: [facility] Electrophysiologist: [facility] Nephrologist:  [facility] 01/31/2023 Weight:   183 lbs 06/21/2023 Weight: 183 lbs 07/24/2023 Weight: 183-184 lbs 11/20/2023 Hospital Discharge Weight: 190 lbs 12/25/2023 Weight: 182 lbs 02/06/2024 Office Weight: 191 lbs 03/04/2024 Weight: 188 lbs  04/20/2024 Hospital Weight: 165 lbs   Clinical Status Since 03-Mar-2024 Time in AF   0.0 hr/day (0.0%)(taking Eliquis )   Transmission results reviewed.   Pt has upcoming appt with HF clinic 8/29.   Diet: He is not strict with limiting salt but eats restaurant foods a couple of times a month.   Optivol Thoracic impedance suggesting fluid levels have returned closer to baseline 8/21.   Prescribed:  Furosemide  40 mg Take 1 tablet (40 mg total) by mouth daily.      Labs: 04/22/2024 Creatinine 2.17, BUN 43, Potassium 3.9, Sodium 134, GFR 31  04/21/2024 Creatinine 1.93, BUN 37, Potassium 4.1, Sodium 137, GFR 36  04/20/2024 Creatinine 1.86, BUN 34, Potassium 4.2, Sodium 138, GFR 38  04/16/2024 Creatinine 1.59, BUN 36, Potassium 3.9, Sodium 138, GFR 45 03/31/2024 Creatinine 1.77, BUN 39, Potassium 3.6, Sodium 136, GFR 40  A complete set of results can be found in Results Review.   Recommendations:   No changes.   Follow-up plan: ICM clinic phone appointment on 06/02/2024.   91 day device clinic remote transmission 08/14/2024.     EP/Cardiology Office Visits:   05/23/2024 with Dr Bethena (1st visit).  Recall 07/01/2024 with Dr Inocencio.   Copy of ICM check sent to Dr. Inocencio.  3 month ICM trend: 05/15/2024.    12-14 Month ICM trend:     Mitzie GORMAN Garner, RN 05/20/2024 7:54 AM

## 2024-05-20 NOTE — Telephone Encounter (Signed)
 Called patient advised of below they verbalized understanding.

## 2024-05-20 NOTE — Telephone Encounter (Signed)
-----   Message from Rosabel JONETTA Mose sent at 05/19/2024  2:20 PM EDT ----- Please let Dean [name] know that his kidney function has decreased compared to his last set of labwork prior to his discharge. His potassium level is elevated, recommend he decrease intake of high  potassium foods and stop any supplements. His LFTs are improving. Would recommend repeat BMET to confirm potassium level, recommend completing prior to appointment with Dr.Stoner, for now continue  current medications and follow up as planned.  ----- Message ----- From: Rebecka Memos Lab Results In Sent: 05/17/2024   6:37 AM EDT To: Katlyn D West, NP

## 2024-05-23 ENCOUNTER — Ambulatory Visit (HOSPITAL_COMMUNITY)
Admission: RE | Admit: 2024-05-23 | Discharge: 2024-05-23 | Disposition: A | Discharge status: Home / Self Care | Source: Ambulatory Visit | Attending: Cardiology | Admitting: Cardiology

## 2024-05-23 ENCOUNTER — Other Ambulatory Visit (HOSPITAL_COMMUNITY): Payer: Self-pay

## 2024-05-23 ENCOUNTER — Encounter (HOSPITAL_COMMUNITY): Payer: Self-pay | Admitting: Cardiology

## 2024-05-23 VITALS — BP 142/54 | HR 68 | Ht 66.0 in | Wt 194.8 lb

## 2024-05-23 DIAGNOSIS — Z8679 Personal history of other diseases of the circulatory system: Secondary | ICD-10-CM | POA: Insufficient documentation

## 2024-05-23 DIAGNOSIS — N1832 Chronic kidney disease, stage 3b: Secondary | ICD-10-CM | POA: Insufficient documentation

## 2024-05-23 DIAGNOSIS — I2510 Atherosclerotic heart disease of native coronary artery without angina pectoris: Secondary | ICD-10-CM | POA: Insufficient documentation

## 2024-05-23 DIAGNOSIS — I5022 Chronic systolic (congestive) heart failure: Secondary | ICD-10-CM

## 2024-05-23 DIAGNOSIS — Z7901 Long term (current) use of anticoagulants: Secondary | ICD-10-CM | POA: Diagnosis not present

## 2024-05-23 DIAGNOSIS — I480 Paroxysmal atrial fibrillation: Secondary | ICD-10-CM | POA: Diagnosis not present

## 2024-05-23 DIAGNOSIS — Z8674 Personal history of sudden cardiac arrest: Secondary | ICD-10-CM | POA: Diagnosis not present

## 2024-05-23 DIAGNOSIS — Z79899 Other long term (current) drug therapy: Secondary | ICD-10-CM | POA: Insufficient documentation

## 2024-05-23 DIAGNOSIS — J069 Acute upper respiratory infection, unspecified: Secondary | ICD-10-CM | POA: Insufficient documentation

## 2024-05-23 DIAGNOSIS — B9789 Other viral agents as the cause of diseases classified elsewhere: Secondary | ICD-10-CM | POA: Diagnosis not present

## 2024-05-23 DIAGNOSIS — Z95810 Presence of automatic (implantable) cardiac defibrillator: Secondary | ICD-10-CM | POA: Diagnosis not present

## 2024-05-23 DIAGNOSIS — I340 Nonrheumatic mitral (valve) insufficiency: Secondary | ICD-10-CM

## 2024-05-23 MED ORDER — HYDRALAZINE HCL 50 MG PO TABS: 50.0000 mg | ORAL_TABLET | Freq: Three times a day (TID) | ORAL | 3 refills | Status: DC

## 2024-05-23 NOTE — Patient Instructions (Signed)
 INCREASE Hydralazine  to 50 mg every 8 hours       Dear Dean Croft Sr.  You are scheduled for a TEE (Transesophageal Echocardiogram) on Monday, September 15 with Dr. [name].  Please arrive at the [facility] (Main Entrance A) at [facility]: [address] [place], [place] [address] at 7:30 AM (This time is 1 hour(s) before your procedure to ensure your preparation).   Free valet parking service is available. You will check in at ADMITTING.   *Please Note: You will receive a call the day before your procedure to confirm the appointment time. That time may have changed from the original time based on the schedule for that day.*    DIET:  Nothing to eat or drink after midnight except a sip of water with medications (see medication instructions below)  MEDICATION INSTRUCTIONS:  HOLD YOUR LASIX  THE DAY OF YOUR PROCEDURE.        :1}Continue taking your anticoagulant (blood thinner): Apixaban  (Eliquis ).  You will need to continue this after your procedure until you are told by your provider that it is safe to stop.     FYI:  For your safety, and to allow us  to monitor your vital signs accurately during the surgery/procedure we request: If you have artificial nails, gel coating, SNS etc, please have those removed prior to your surgery/procedure. Not having the nail coverings /polish removed may result in cancellation or delay of your surgery/procedure.  Your support person will be asked to wait in the waiting room during your procedure.  It is OK to have someone drop you off and come back when you are ready to be discharged.  You cannot drive after the procedure and will need someone to drive you home.  Bring your insurance cards.  *Special Note: Every effort is made to have your procedure done on time. Occasionally there are emergencies that occur at the hospital that may cause delays. Please be patient if a delay does occur.   Your physician recommends that you schedule a  follow-up appointment in: 3 months ( November) ** PLEASE CALL THE OFFICE IN OCTOBER TO ARRANGE YOUR FOLLOW UP APPOINTMENT.**  If you have any questions or concerns before your next appointment please send us  a message through [place] or call our office at [phone].    TO LEAVE A MESSAGE FOR THE NURSE SELECT OPTION 2, PLEASE LEAVE A MESSAGE INCLUDING: YOUR NAME DATE OF BIRTH CALL BACK NUMBER REASON FOR CALL**this is important as we prioritize the call backs  YOU WILL RECEIVE A CALL BACK THE SAME DAY AS LONG AS YOU CALL BEFORE 4:00 PM  At the [facility], you and your health needs are our priority. As part of our continuing mission to provide you with exceptional heart care, we have created designated Provider Care Teams. These Care Teams include your primary Cardiologist (physician) and Advanced Practice Providers (APPs- Physician Assistants and Nurse Practitioners) who all work together to provide you with the care you need, when you need it.   You may see any of the following providers on your designated Care Team at your next follow up: Dr [name] Dr Ezra Shuck Dr. Ria Commander Dr. Morene [name] Amy Lenetta, NP Caffie Shed, [place] [facility] [place], [place] Beckey Coe, NP [place] Lee, NP Ellouise Class, NP Tinnie Redman, PharmD Jaun Bash, PharmD   Please be sure to bring in all your medications bottles to every appointment.    Thank you for choosing [facility]

## 2024-05-27 NOTE — H&P (View-Only) (Signed)
 ADVANCED HEART FAILURE FOLLOW UP CLINIC NOTE  Referring Physician: [name], MD  Primary Care: [name], MD Primary Cardiologist:  HPI: Dean Hudspeth Sr. is a 73 y.o. male who presents for follow up of chronic systolic heart failure.      Cardiac history dates back to 2017 when he suffered VT/VF arrest requiring CPR/defibrillation. Cardiac cath demonstrated 90% ostial RCA and 100% m to d RCA with collaterals. CAD treated medically. Echo with LVEF 35-40% and AK of basal inferior and apical myocardium. Later underwent single chamber medtronic ICD in 09/2016. Since then EF has been in range of 30-35%.    Admitted in 12/23 d/t ICD shock for VT/VF in setting of amiodarone  noncompliance.   He was admitted in 2/25 with GI bleed requiring transfusions after polypectomy by hot snare with clips.   He was admitted 07/27-07/29/25 with acute on chronic CHF. He was diuresed with IV lasix .   Presented 04/2024 with shortness of breath and dysuria (prior UTI). Became cold on exam with uptrending lactic acid and underwent RHC showing severely elevated filling pressures and reduced cardiac index. Required IV milrinone  with improvement, weaned off with aggressive diuresis prior to discharge.      SUBJECTIVE:  Overall doing well after discharge. Does have some persistent shortness of breath but his cough is significantly improved as well. No chest pain, lightheadedness, dizziness, orthopnea significantly improved. Discussed potential need for TEE and evaluation of mitral valve. Will continue to optimize GDMT in the interim.   PMH, current medications, allergies, social history, and family history reviewed in epic.  PHYSICAL EXAM: Vitals:   05/23/24 1214  BP: (!) 142/54  Pulse: 68  SpO2: 99%   GENERAL: Well nourished and in no apparent distress at rest.  PULM:  Normal work of breathing, clear to auscultation bilaterally. Respirations are unlabored.  CARDIAC:  JVP: mildly elevated          Normal rate with regular rhythm. Holosystolic murmur at the apex.   1+ LE edema. Warm and well perfused extremities. ABDOMEN: Soft, non-tender, non-distended. NEUROLOGIC: Patient is oriented x3 with no focal or lateralizing neurologic deficits.    DATA REVIEW  ECG 05/05/2024: NSIVCD, LBBB apperance, sinus  ECHO: 05/08/2024: LVEF 35-40%, mild grade II DD, mildly reduced RV function, posterior leaflet restricted with moderate to severe MR   CATH: 05/06/2024: RA 15, PA 64/27 (39), PCWP 25 with v waves to 40, TD CO/CI 3.6/1.8, PAPi 2.5  08/2016: RCA occluded with left to right collaterals,   ASSESSMENT & PLAN:  Chronic systolic heart failure: Primarily ischemic, prior history of infarcted RCA. Does have likely severe MR, ischemic with restricted posterior leaflet. Will plan on TEE to further evaluate. - TEE for evaluation of MR - Will discuss with EP prior to consideration of Mitraclip, does not have a classic left bundle but suspect may benefit from CRT - Increase hydralazine  to 50mg  TID - Continue imdur  30mg  daily - Continue lasix  40mg  daily - No SGLT-2 with recent UTI - CKD limits MRA and RAASi - ICD in place  Mitral regurgitation: 2/2 ischemic disease. Appears likely severe. TEE to assess for clip candidacy. Concerning features of MAC and elevated PA pressure on RHC, though in the setting of decompensated heart failure and viral URI. - TEE as above - Continue diuresis and med optimization  PAF - Previously diagnosed on device - SR today. No AF on device check 08/04. - Continue amio 200 mg daily  - Continue Eliquis  5 mg twice a day.  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 - Recent creatinine 1.8, continue diuresis as above - Attempt rechallenge with SGLT-2 in the future  CAD: Single vessel RCA disease. - No anginal pain    Follow up in 2 months  [name], MD Advanced [facility] Mechanical  Circulatory Support 05/27/24

## 2024-05-27 NOTE — Progress Notes (Signed)
 ADVANCED HEART FAILURE FOLLOW UP CLINIC NOTE  Referring Physician: [name], MD  Primary Care: [name], MD Primary Cardiologist:  HPI: Dean Hudspeth Sr. is a 73 y.o. male who presents for follow up of chronic systolic heart failure.      Cardiac history dates back to 2017 when he suffered VT/VF arrest requiring CPR/defibrillation. Cardiac cath demonstrated 90% ostial RCA and 100% m to d RCA with collaterals. CAD treated medically. Echo with LVEF 35-40% and AK of basal inferior and apical myocardium. Later underwent single chamber medtronic ICD in 09/2016. Since then EF has been in range of 30-35%.    Admitted in 12/23 d/t ICD shock for VT/VF in setting of amiodarone  noncompliance.   He was admitted in 2/25 with GI bleed requiring transfusions after polypectomy by hot snare with clips.   He was admitted 07/27-07/29/25 with acute on chronic CHF. He was diuresed with IV lasix .   Presented 04/2024 with shortness of breath and dysuria (prior UTI). Became cold on exam with uptrending lactic acid and underwent RHC showing severely elevated filling pressures and reduced cardiac index. Required IV milrinone  with improvement, weaned off with aggressive diuresis prior to discharge.      SUBJECTIVE:  Overall doing well after discharge. Does have some persistent shortness of breath but his cough is significantly improved as well. No chest pain, lightheadedness, dizziness, orthopnea significantly improved. Discussed potential need for TEE and evaluation of mitral valve. Will continue to optimize GDMT in the interim.   PMH, current medications, allergies, social history, and family history reviewed in epic.  PHYSICAL EXAM: Vitals:   05/23/24 1214  BP: (!) 142/54  Pulse: 68  SpO2: 99%   GENERAL: Well nourished and in no apparent distress at rest.  PULM:  Normal work of breathing, clear to auscultation bilaterally. Respirations are unlabored.  CARDIAC:  JVP: mildly elevated          Normal rate with regular rhythm. Holosystolic murmur at the apex.   1+ LE edema. Warm and well perfused extremities. ABDOMEN: Soft, non-tender, non-distended. NEUROLOGIC: Patient is oriented x3 with no focal or lateralizing neurologic deficits.    DATA REVIEW  ECG 05/05/2024: NSIVCD, LBBB apperance, sinus  ECHO: 05/08/2024: LVEF 35-40%, mild grade II DD, mildly reduced RV function, posterior leaflet restricted with moderate to severe MR   CATH: 05/06/2024: RA 15, PA 64/27 (39), PCWP 25 with v waves to 40, TD CO/CI 3.6/1.8, PAPi 2.5  08/2016: RCA occluded with left to right collaterals,   ASSESSMENT & PLAN:  Chronic systolic heart failure: Primarily ischemic, prior history of infarcted RCA. Does have likely severe MR, ischemic with restricted posterior leaflet. Will plan on TEE to further evaluate. - TEE for evaluation of MR - Will discuss with EP prior to consideration of Mitraclip, does not have a classic left bundle but suspect may benefit from CRT - Increase hydralazine  to 50mg  TID - Continue imdur  30mg  daily - Continue lasix  40mg  daily - No SGLT-2 with recent UTI - CKD limits MRA and RAASi - ICD in place  Mitral regurgitation: 2/2 ischemic disease. Appears likely severe. TEE to assess for clip candidacy. Concerning features of MAC and elevated PA pressure on RHC, though in the setting of decompensated heart failure and viral URI. - TEE as above - Continue diuresis and med optimization  PAF - Previously diagnosed on device - SR today. No AF on device check 08/04. - Continue amio 200 mg daily  - Continue Eliquis  5 mg twice a day.  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 - Recent creatinine 1.8, continue diuresis as above - Attempt rechallenge with SGLT-2 in the future  CAD: Single vessel RCA disease. - No anginal pain    Follow up in 2 months  [name], MD Advanced [facility] Failure Mechanical  Circulatory Support 05/27/24

## 2024-06-02 ENCOUNTER — Ambulatory Visit: Attending: Cardiology

## 2024-06-02 DIAGNOSIS — Z95810 Presence of automatic (implantable) cardiac defibrillator: Secondary | ICD-10-CM

## 2024-06-02 DIAGNOSIS — I5022 Chronic systolic (congestive) heart failure: Secondary | ICD-10-CM

## 2024-06-04 ENCOUNTER — Telehealth: Payer: Self-pay

## 2024-06-04 NOTE — Telephone Encounter (Signed)
 Remote ICM transmission received.  Attempted call to patient regarding ICM remote transmission and no answer.

## 2024-06-04 NOTE — Progress Notes (Signed)
 EPIC Encounter for ICM Monitoring  Patient Name: Dean Montoya Sr. is a 73 y.o. male Date: 06/04/2024 Primary Care Physican: [name], MD Primary Cardiologist: [facility] Electrophysiologist: [facility] Nephrologist:  [facility] 01/31/2023 Weight:   183 lbs 06/21/2023 Weight: 183 lbs 07/24/2023 Weight: 183-184 lbs 11/20/2023 Hospital Discharge Weight: 190 lbs 12/25/2023 Weight: 182 lbs 02/06/2024 Office Weight: 191 lbs 03/04/2024 Weight: 188 lbs  04/20/2024 Hospital Weight: 165 lbs 05/23/2024 Office Weigh: 194 lbs   Clinical Status Since 03-Mar-2024 Time in AF   0.0 hr/day (0.0%)(taking Eliquis )   Attempted call to patient and unable to reach.   Transmission results reviewed.    Diet: He is not strict with limiting salt but eats restaurant foods a couple of times a month.   Optivol Thoracic impedance suggesting fluid levels trending closer to baseline starting 8/21.   Prescribed:  Furosemide  40 mg Take 1 tablet (40 mg total) by mouth daily.      Labs: 04/22/2024 Creatinine 2.17, BUN 43, Potassium 3.9, Sodium 134, GFR 31  04/21/2024 Creatinine 1.93, BUN 37, Potassium 4.1, Sodium 137, GFR 36  04/20/2024 Creatinine 1.86, BUN 34, Potassium 4.2, Sodium 138, GFR 38  04/16/2024 Creatinine 1.59, BUN 36, Potassium 3.9, Sodium 138, GFR 45 03/31/2024 Creatinine 1.77, BUN 39, Potassium 3.6, Sodium 136, GFR 40  A complete set of results can be found in Results Review.   Recommendations:  Unable to reach.     Follow-up plan: ICM clinic phone appointment on 07/14/2024.   91 day device clinic remote transmission 08/14/2024.     EP/Cardiology Office Visits:   Recall 07/22/2024 with Dr [name].  Recall 07/01/2024 with Dr Inocencio.   Copy of ICM check sent to Dr. Inocencio.   3 month ICM trend: 06/02/2024.    12-14 Month ICM trend:     Mitzie GORMAN Garner, RN 06/04/2024 2:19 PM

## 2024-06-05 LAB — BASIC METABOLIC PANEL WITH GFR
BUN/Creatinine Ratio: 13 (ref 10–24)
BUN: 23 mg/dL (ref 8–27)
CO2: 22 mmol/L (ref 20–29)
Calcium: 10.2 mg/dL (ref 8.6–10.2)
Chloride: 100 mmol/L (ref 96–106)
Creatinine, Ser: 1.83 mg/dL — AB (ref 0.76–1.27)
Glucose: 99 mg/dL (ref 70–99)
Potassium: 4 mmol/L (ref 3.5–5.2)
Sodium: 140 mmol/L (ref 134–144)
eGFR: 38 mL/min/1.73 — AB (ref >59)

## 2024-06-06 NOTE — Progress Notes (Signed)
 Attempted return call to patient and no answer.  Mail box is full and unable to leave message.

## 2024-06-06 NOTE — Telephone Encounter (Signed)
 Called patient attempted to leave a message but vm is full.

## 2024-06-06 NOTE — Progress Notes (Signed)
 Attempted phone call regarding patient TEE appointment. Pt did not answer at this time; unable to leave voicemail as mailbox is full. No alternative contact option found for this patient.

## 2024-06-06 NOTE — Telephone Encounter (Signed)
 Pt is returning nurses' call.

## 2024-06-06 NOTE — Progress Notes (Signed)
 Given results for lab.

## 2024-06-06 NOTE — Telephone Encounter (Signed)
  Attempted return call to patient and no answer.  Mail box is full and unable to leave message.

## 2024-06-09 ENCOUNTER — Ambulatory Visit (HOSPITAL_BASED_OUTPATIENT_CLINIC_OR_DEPARTMENT_OTHER)
Admission: RE | Admit: 2024-06-09 | Discharge: 2024-06-09 | Disposition: A | Discharge status: Home / Self Care | Source: Ambulatory Visit | Attending: Cardiology | Admitting: Cardiology

## 2024-06-09 ENCOUNTER — Ambulatory Visit (HOSPITAL_COMMUNITY): Admitting: Anesthesiology

## 2024-06-09 ENCOUNTER — Ambulatory Visit (HOSPITAL_COMMUNITY)
Admission: RE | Admit: 2024-06-09 | Discharge: 2024-06-09 | Disposition: A | Discharge status: Home / Self Care | Attending: Cardiology | Admitting: Cardiology

## 2024-06-09 ENCOUNTER — Encounter (HOSPITAL_COMMUNITY)
Admission: RE | Disposition: A | Discharge status: Home / Self Care | Payer: Self-pay | Source: Home / Self Care | Attending: Cardiology

## 2024-06-09 ENCOUNTER — Other Ambulatory Visit: Payer: Self-pay

## 2024-06-09 ENCOUNTER — Encounter (HOSPITAL_COMMUNITY): Payer: Self-pay | Admitting: Cardiology

## 2024-06-09 DIAGNOSIS — I480 Paroxysmal atrial fibrillation: Secondary | ICD-10-CM | POA: Insufficient documentation

## 2024-06-09 DIAGNOSIS — Z79899 Other long term (current) drug therapy: Secondary | ICD-10-CM | POA: Diagnosis not present

## 2024-06-09 DIAGNOSIS — E1122 Type 2 diabetes mellitus with diabetic chronic kidney disease: Secondary | ICD-10-CM | POA: Insufficient documentation

## 2024-06-09 DIAGNOSIS — I5022 Chronic systolic (congestive) heart failure: Secondary | ICD-10-CM | POA: Diagnosis not present

## 2024-06-09 DIAGNOSIS — I2510 Atherosclerotic heart disease of native coronary artery without angina pectoris: Secondary | ICD-10-CM | POA: Insufficient documentation

## 2024-06-09 DIAGNOSIS — I130 Hypertensive heart and chronic kidney disease with heart failure and stage 1 through stage 4 chronic kidney disease, or unspecified chronic kidney disease: Secondary | ICD-10-CM | POA: Diagnosis not present

## 2024-06-09 DIAGNOSIS — Z95810 Presence of automatic (implantable) cardiac defibrillator: Secondary | ICD-10-CM | POA: Diagnosis not present

## 2024-06-09 DIAGNOSIS — I340 Nonrheumatic mitral (valve) insufficiency: Secondary | ICD-10-CM | POA: Diagnosis not present

## 2024-06-09 DIAGNOSIS — Q2112 Patent foramen ovale: Secondary | ICD-10-CM | POA: Diagnosis not present

## 2024-06-09 DIAGNOSIS — N183 Chronic kidney disease, stage 3 unspecified: Secondary | ICD-10-CM

## 2024-06-09 DIAGNOSIS — I5043 Acute on chronic combined systolic (congestive) and diastolic (congestive) heart failure: Secondary | ICD-10-CM | POA: Diagnosis not present

## 2024-06-09 DIAGNOSIS — Z8674 Personal history of sudden cardiac arrest: Secondary | ICD-10-CM | POA: Insufficient documentation

## 2024-06-09 DIAGNOSIS — Q2381 Bicuspid aortic valve: Secondary | ICD-10-CM | POA: Diagnosis present

## 2024-06-09 DIAGNOSIS — Z7901 Long term (current) use of anticoagulants: Secondary | ICD-10-CM | POA: Insufficient documentation

## 2024-06-09 DIAGNOSIS — N1832 Chronic kidney disease, stage 3b: Secondary | ICD-10-CM | POA: Insufficient documentation

## 2024-06-09 DIAGNOSIS — I081 Rheumatic disorders of both mitral and tricuspid valves: Secondary | ICD-10-CM | POA: Insufficient documentation

## 2024-06-09 DIAGNOSIS — Z8679 Personal history of other diseases of the circulatory system: Secondary | ICD-10-CM | POA: Diagnosis not present

## 2024-06-09 HISTORY — PX: TRANSESOPHAGEAL ECHOCARDIOGRAM (CATH LAB): EP1270

## 2024-06-09 LAB — ECHO TEE

## 2024-06-09 SURGERY — TRANSESOPHAGEAL ECHOCARDIOGRAM (TEE) (CATHLAB)
Anesthesia: Monitor Anesthesia Care

## 2024-06-09 MED ORDER — LIDOCAINE 2% (20 MG/ML) 5 ML SYRINGE
INTRAMUSCULAR | Status: DC | PRN
  Administered 2024-06-09: 100 mg via INTRAVENOUS

## 2024-06-09 MED ORDER — HYDRALAZINE HCL 50 MG PO TABS: 75.0000 mg | ORAL_TABLET | Freq: Three times a day (TID) | ORAL | 3 refills | Status: DC

## 2024-06-09 MED ORDER — SODIUM CHLORIDE 0.9 % IV SOLN: INTRAVENOUS | Status: DC

## 2024-06-09 MED ORDER — PROPOFOL 500 MG/50ML IV EMUL
INTRAVENOUS | Status: DC | PRN
  Administered 2024-06-09: 75 ug/kg/min via INTRAVENOUS

## 2024-06-09 MED ORDER — PROPOFOL 10 MG/ML IV BOLUS
INTRAVENOUS | Status: DC | PRN
  Administered 2024-06-09 (×2): 40 mg via INTRAVENOUS

## 2024-06-09 MED ORDER — ISOSORBIDE MONONITRATE ER 30 MG PO TB24: 60.0000 mg | ORAL_TABLET | Freq: Every day | ORAL | 1 refills | Status: DC

## 2024-06-09 NOTE — Interval H&P Note (Signed)
 History and Physical Interval Note:  06/09/2024 8:40 AM  Dean Croft Sr.  has presented today for surgery, with the diagnosis of MITRAL REGURGIGATION.  The various methods of treatment have been discussed with the patient and family. After consideration of risks, benefits and other options for treatment, the patient has consented to  Procedure(s): TRANSESOPHAGEAL ECHOCARDIOGRAM (N/A) as a surgical intervention.  The patient's history has been reviewed, patient examined, no change in status, stable for surgery.  I have reviewed the patient's chart and labs.  Questions were answered to the patient's satisfaction.     [name]

## 2024-06-09 NOTE — Transfer of Care (Signed)
 Immediate Anesthesia Transfer of Care Note  Patient: Dean [name].  Procedure(s) Performed: TRANSESOPHAGEAL ECHOCARDIOGRAM  Patient Location: PACU and Cath Lab  Anesthesia Type:MAC  Level of Consciousness: drowsy and patient cooperative  Airway & Oxygen Therapy: Patient Spontanous Breathing and Patient connected to nasal cannula oxygen  Post-op Assessment: Report given to RN and Post -op Vital signs reviewed and stable  Post vital signs: Reviewed and stable  Last Vitals:  Vitals Value Taken Time  BP    Temp    Pulse    Resp    SpO2      Last Pain:  Vitals:   06/09/24 0745  TempSrc: Temporal         Complications: No notable events documented.

## 2024-06-09 NOTE — Procedures (Signed)
   TRANSESOPHAGEAL ECHOCARDIOGRAM  NAME:  Dean Croft Sr.    MRN: [id number] DOB:  [birth year]    ADMIT DATE: 06/09/2024  INDICATIONS: Mitral regurgitation  PROCEDURE:   Informed consent was obtained prior to the procedure. The risks, benefits and alternatives for the procedure were discussed and the patient comprehended these risks.  Risks include, but are not limited to, cough, sore throat, vomiting, nausea, somnolence, esophageal and stomach trauma or perforation, bleeding, low blood pressure, aspiration, pneumonia, infection, trauma to the teeth and death.    After a procedural timeout, the patient was administered propofol  per anesthesia.  The patient's heart rate, blood pressure, and oxygen saturation were monitored continuously during the procedure. The period of conscious sedation was 20 minutes, of which I was present face-to-face 100% of this time.  The transesophageal probe was inserted in the esophagus and stomach without difficulty and multiple views were obtained.  The patient was kept under observation until the patient left the procedure room.  The patient left the procedure room in stable condition.    COMPLICATIONS:    Complications: No complications.  The patient had normal neuro status and respiratory status post procedure with vitals stable as recorded elsewhere.  Adequate airway was maintained throughout and vital signs monitored per protocol.  KEY FINDINGS:  Moderate MR by PISA method with systolic blunting of the pulmonary veins, no reversal Short posterior leaflet, poor coaptation Moderately reduced LVEF No LAA thromubs Severe lead related TR  [name] [facility] 9:03 AM

## 2024-06-09 NOTE — Anesthesia Preprocedure Evaluation (Addendum)
 Anesthesia Evaluation  Patient identified by MRN, date of birth, ID band  Reviewed: Allergy & Precautions, NPO status , Patient's Chart, lab work & pertinent test results, reviewed documented beta blocker date and time   History of Anesthesia Complications Negative for: history of anesthetic complications  Airway Mallampati: III  TM Distance: >3 FB     Dental  (+) Poor Dentition, Missing, Dental Advisory Given   Pulmonary asthma , sleep apnea and Continuous Positive Airway Pressure Ventilation , pneumonia   breath sounds clear to auscultation       Cardiovascular hypertension, + CAD and +CHF  + dysrhythmias + Cardiac Defibrillator  Rhythm:Regular Rate:Normal   1. Left ventricular ejection fraction, by estimation, is 35 to 40%. The  left ventricle has moderately decreased function. The left ventricle  demonstrates global hypokinesis. There is mild concentric left ventricular  hypertrophy. Left ventricular  diastolic parameters are consistent with Grade II diastolic dysfunction  (pseudonormalization).   2. Right ventricular systolic function is mildly reduced. The right  ventricular size is mildly enlarged. There is moderately elevated  pulmonary artery systolic pressure. The estimated right ventricular  systolic pressure is 58.4 mmHg.   3. Left atrial size was severely dilated.   4. Right atrial size was moderately dilated.   5. The posterior leaflet is restricted. The mitral valve is abnormal.  Moderate to severe mitral valve regurgitation. No evidence of mitral  stenosis. Moderate mitral annular calcification.   6. Tricuspid valve regurgitation is moderate.   7. The aortic valve is bicuspid. There is moderate calcification of the  aortic valve. Aortic valve regurgitation is mild. No aortic stenosis is  present.   8. The inferior vena cava is dilated in size with >50% respiratory  variability, suggesting right atrial pressure of  8 mmHg.      Neuro/Psych neg Seizures    GI/Hepatic ,,,(+) neg Cirrhosis        Endo/Other  diabetes, Type 2    Renal/GU CRFRenal disease     Musculoskeletal  (+) Arthritis ,    Abdominal   Peds  Hematology  (+) Blood dyscrasia, anemia   Anesthesia Other Findings   Reproductive/Obstetrics                              Anesthesia Physical Anesthesia Plan  ASA: 3  Anesthesia Plan: MAC   Post-op Pain Management:    Induction: Intravenous  PONV Risk Score and Plan: 2  Airway Management Planned: Natural Airway and Nasal Cannula  Additional Equipment:   Intra-op Plan:   Post-operative Plan:   Informed Consent: I have reviewed the patients History and Physical, chart, labs and discussed the procedure including the risks, benefits and alternatives for the proposed anesthesia with the patient or authorized representative who has indicated his/her understanding and acceptance.     Dental advisory given  Plan Discussed with: CRNA  Anesthesia Plan Comments:          Anesthesia Quick Evaluation

## 2024-06-09 NOTE — Discharge Instructions (Signed)
 TEE   YOU HAD AN CARDIAC PROCEDURE TODAY: Refer to the procedure report and other information in the discharge instructions given to you for any specific questions about what was found during the examination. If this information does not answer your questions, please call Dr. Verl Dicker office at [phone] to clarify.   DIET: Your first meal following the procedure should be a light meal and then it is ok to progress to your normal diet. A half-sandwich or bowl of soup is an example of a good first meal. Heavy or fried foods are harder to digest and may make you feel nauseous or bloated. Drink plenty of fluids but you should avoid alcoholic beverages for 24 hours. If you had a esophageal dilation, please see attached instructions for diet.   ACTIVITY: Your care partner should take you home directly after the procedure. You should plan to take it easy, moving slowly for the rest of the day. You can resume normal activity the day after the procedure however YOU SHOULD NOT DRIVE, use power tools, machinery or perform tasks that involve climbing or major physical exertion for 24 hours (because of the sedation medicines used during the test).   SYMPTOMS TO REPORT IMMEDIATELY: A cardiologist can be reached at any hour. Please call [phone] for any of the following symptoms:  Vomiting of blood or coffee ground material  New, significant abdominal pain  New, significant chest pain or pain under the shoulder blades  Painful or persistently difficult swallowing  New shortness of breath  Black, tarry-looking or red, bloody stools  FOLLOW UP:  Please also call with any specific questions about appointments or follow up tests.

## 2024-06-09 NOTE — Anesthesia Postprocedure Evaluation (Signed)
 Anesthesia Post Note  Patient: Dean Rekowski Sr.  Procedure(s) Performed: TRANSESOPHAGEAL ECHOCARDIOGRAM     Patient location during evaluation: PACU Anesthesia Type: MAC Level of consciousness: awake and alert Pain management: pain level controlled Vital Signs Assessment: post-procedure vital signs reviewed and stable Respiratory status: spontaneous breathing, nonlabored ventilation, respiratory function stable and patient connected to nasal cannula oxygen Cardiovascular status: blood pressure returned to baseline and stable Postop Assessment: no apparent nausea or vomiting Anesthetic complications: no   No notable events documented.  Last Vitals:  Vitals:   06/09/24 0935 06/09/24 0940  BP: 109/74 (!) 120/48  Pulse: 79 80  Resp: (!) 21 20  Temp:    SpO2: 95% 94%    Last Pain:  Vitals:   06/09/24 0912  TempSrc: Temporal  PainSc: 0-No pain                 Lynwood MARLA Cornea

## 2024-06-12 ENCOUNTER — Telehealth (HOSPITAL_COMMUNITY): Payer: Self-pay | Admitting: *Deleted

## 2024-06-12 NOTE — Telephone Encounter (Signed)
 Pt called to ask about TEE results, advised note is not finished but concern for mod MR and mod-sev lead related TR, f/u appt sch to review results with Dr [name] (appt 06/18/24).  Pt also ask about medications and seems confused over recent frequent changes to Hydralazine  and Imdur . Was able to confirm he is taking Imdur  60 mg (2 tabs) Daily and Hydralazine  TID but unsure dosage. Pt will bring all medication bottles to appt on 9/24

## 2024-06-16 ENCOUNTER — Other Ambulatory Visit (HOSPITAL_COMMUNITY): Payer: Self-pay | Admitting: Cardiology

## 2024-06-17 ENCOUNTER — Telehealth (HOSPITAL_COMMUNITY): Payer: Self-pay | Admitting: Cardiology

## 2024-06-17 NOTE — Telephone Encounter (Signed)
 Called to confirm/remind patient of their appointment at the [facility] on 06/17/24.   Appointment:   [] Confirmed  [x] Left mess   [] No answer/No voice mail  [] VM Full/unable to leave message  [] Phone not in service  Patient reminded to bring all medications and/or complete list.  Confirmed patient has transportation. Gave directions, instructed to utilize valet parking.

## 2024-06-18 ENCOUNTER — Ambulatory Visit (HOSPITAL_COMMUNITY)
Admission: RE | Admit: 2024-06-18 | Discharge: 2024-06-18 | Disposition: A | Discharge status: Home / Self Care | Source: Ambulatory Visit | Attending: Cardiology | Admitting: Cardiology

## 2024-06-18 VITALS — BP 134/66 | HR 89 | Wt 189.6 lb

## 2024-06-18 DIAGNOSIS — Z8679 Personal history of other diseases of the circulatory system: Secondary | ICD-10-CM | POA: Insufficient documentation

## 2024-06-18 DIAGNOSIS — R0601 Orthopnea: Secondary | ICD-10-CM | POA: Diagnosis not present

## 2024-06-18 DIAGNOSIS — Z7901 Long term (current) use of anticoagulants: Secondary | ICD-10-CM | POA: Insufficient documentation

## 2024-06-18 DIAGNOSIS — I10 Essential (primary) hypertension: Secondary | ICD-10-CM | POA: Diagnosis not present

## 2024-06-18 DIAGNOSIS — I482 Chronic atrial fibrillation, unspecified: Secondary | ICD-10-CM | POA: Diagnosis not present

## 2024-06-18 DIAGNOSIS — I480 Paroxysmal atrial fibrillation: Secondary | ICD-10-CM | POA: Insufficient documentation

## 2024-06-18 DIAGNOSIS — I2510 Atherosclerotic heart disease of native coronary artery without angina pectoris: Secondary | ICD-10-CM | POA: Insufficient documentation

## 2024-06-18 DIAGNOSIS — Z8674 Personal history of sudden cardiac arrest: Secondary | ICD-10-CM | POA: Insufficient documentation

## 2024-06-18 DIAGNOSIS — Z95810 Presence of automatic (implantable) cardiac defibrillator: Secondary | ICD-10-CM | POA: Insufficient documentation

## 2024-06-18 DIAGNOSIS — I340 Nonrheumatic mitral (valve) insufficiency: Secondary | ICD-10-CM | POA: Diagnosis not present

## 2024-06-18 DIAGNOSIS — N1832 Chronic kidney disease, stage 3b: Secondary | ICD-10-CM | POA: Insufficient documentation

## 2024-06-18 DIAGNOSIS — N183 Chronic kidney disease, stage 3 unspecified: Secondary | ICD-10-CM

## 2024-06-18 DIAGNOSIS — I5022 Chronic systolic (congestive) heart failure: Secondary | ICD-10-CM | POA: Insufficient documentation

## 2024-06-18 DIAGNOSIS — Z79899 Other long term (current) drug therapy: Secondary | ICD-10-CM | POA: Insufficient documentation

## 2024-06-18 MED ORDER — ISOSORBIDE MONONITRATE ER 60 MG PO TB24: 60.0000 mg | ORAL_TABLET | Freq: Every day | ORAL | 3 refills | Status: DC

## 2024-06-18 NOTE — Progress Notes (Signed)
Remote ICD Transmission.

## 2024-06-18 NOTE — Patient Instructions (Signed)
 TAKE Lasix  40 mg Twice daily for 1 week, then go back to 40 mg daily.  Your physician recommends that you schedule a follow-up appointment in: 2 months.  If you have any questions or concerns before your next appointment please send us  a message through [place] or call our office at [phone].    TO LEAVE A MESSAGE FOR THE NURSE SELECT OPTION 2, PLEASE LEAVE A MESSAGE INCLUDING: YOUR NAME DATE OF BIRTH CALL BACK NUMBER REASON FOR CALL**this is important as we prioritize the call backs  YOU WILL RECEIVE A CALL BACK THE SAME DAY AS LONG AS YOU CALL BEFORE 4:00 PM  At the [facility], you and your health needs are our priority. As part of our continuing mission to provide you with exceptional heart care, we have created designated Provider Care Teams. These Care Teams include your primary Cardiologist (physician) and Advanced Practice Providers (APPs- Physician Assistants and Nurse Practitioners) who all work together to provide you with the care you need, when you need it.   You may see any of the following providers on your designated Care Team at your next follow up: Dr [name] Dr Ezra Shuck Dr. Ria Commander Dr. [name] Amy Lenetta, NP Caffie Shed, [place] [facility] [place], [place] Beckey Coe, NP [place] Lee, NP Ellouise Class, NP Tinnie Redman, PharmD Jaun Bash, PharmD   Please be sure to bring in all your medications bottles to every appointment.    Thank you for choosing [facility]

## 2024-06-25 ENCOUNTER — Emergency Department (HOSPITAL_COMMUNITY)

## 2024-06-25 ENCOUNTER — Telehealth (HOSPITAL_COMMUNITY): Payer: Self-pay | Admitting: Cardiology

## 2024-06-25 ENCOUNTER — Encounter (HOSPITAL_COMMUNITY): Payer: Self-pay | Admitting: *Deleted

## 2024-06-25 ENCOUNTER — Emergency Department (HOSPITAL_COMMUNITY)
Admission: EM | Admit: 2024-06-25 | Discharge: 2024-06-25 | Disposition: A | Discharge status: Home / Self Care | Attending: Emergency Medicine | Admitting: Emergency Medicine

## 2024-06-25 ENCOUNTER — Other Ambulatory Visit: Payer: Self-pay

## 2024-06-25 DIAGNOSIS — I130 Hypertensive heart and chronic kidney disease with heart failure and stage 1 through stage 4 chronic kidney disease, or unspecified chronic kidney disease: Secondary | ICD-10-CM | POA: Insufficient documentation

## 2024-06-25 DIAGNOSIS — Z7901 Long term (current) use of anticoagulants: Secondary | ICD-10-CM | POA: Insufficient documentation

## 2024-06-25 DIAGNOSIS — R600 Localized edema: Secondary | ICD-10-CM | POA: Diagnosis not present

## 2024-06-25 DIAGNOSIS — N1832 Chronic kidney disease, stage 3b: Secondary | ICD-10-CM | POA: Insufficient documentation

## 2024-06-25 DIAGNOSIS — Z79899 Other long term (current) drug therapy: Secondary | ICD-10-CM | POA: Insufficient documentation

## 2024-06-25 DIAGNOSIS — I480 Paroxysmal atrial fibrillation: Secondary | ICD-10-CM | POA: Insufficient documentation

## 2024-06-25 DIAGNOSIS — E1122 Type 2 diabetes mellitus with diabetic chronic kidney disease: Secondary | ICD-10-CM | POA: Insufficient documentation

## 2024-06-25 DIAGNOSIS — I5042 Chronic combined systolic (congestive) and diastolic (congestive) heart failure: Secondary | ICD-10-CM | POA: Insufficient documentation

## 2024-06-25 DIAGNOSIS — N189 Chronic kidney disease, unspecified: Secondary | ICD-10-CM

## 2024-06-25 DIAGNOSIS — N3000 Acute cystitis without hematuria: Secondary | ICD-10-CM | POA: Insufficient documentation

## 2024-06-25 DIAGNOSIS — R0602 Shortness of breath: Secondary | ICD-10-CM | POA: Diagnosis present

## 2024-06-25 LAB — URINALYSIS, ROUTINE W REFLEX MICROSCOPIC
Bilirubin Urine: NEGATIVE
Glucose, UA: NEGATIVE mg/dL
Hgb urine dipstick: NEGATIVE
Ketones, ur: NEGATIVE mg/dL
Nitrite: NEGATIVE
Protein, ur: NEGATIVE mg/dL
Specific Gravity, Urine: 1.008 (ref 1.005–1.030)
pH: 5 (ref 5.0–8.0)

## 2024-06-25 LAB — BASIC METABOLIC PANEL WITH GFR
Anion gap: 15 (ref 5–15)
BUN: 33 mg/dL — ABNORMAL HIGH (ref 8–23)
CO2: 19 mmol/L — ABNORMAL LOW (ref 22–32)
Calcium: 10 mg/dL (ref 8.9–10.3)
Chloride: 100 mmol/L (ref 98–111)
Creatinine, Ser: 2.16 mg/dL — ABNORMAL HIGH (ref 0.61–1.24)
GFR, Estimated: 31 mL/min — ABNORMAL LOW (ref >60)
Glucose, Bld: 108 mg/dL — ABNORMAL HIGH (ref 70–99)
Potassium: 4 mmol/L (ref 3.5–5.1)
Sodium: 134 mmol/L — ABNORMAL LOW (ref 135–145)

## 2024-06-25 LAB — CBC
HCT: 36.1 % — ABNORMAL LOW (ref 39.0–52.0)
Hemoglobin: 11.4 g/dL — ABNORMAL LOW (ref 13.0–17.0)
MCH: 29.7 pg (ref 26.0–34.0)
MCHC: 31.6 g/dL (ref 30.0–36.0)
MCV: 94 fL (ref 80.0–100.0)
Platelets: 160 K/uL (ref 150–400)
RBC: 3.84 MIL/uL — ABNORMAL LOW (ref 4.22–5.81)
RDW: 18 % — ABNORMAL HIGH (ref 11.5–15.5)
WBC: 9.9 K/uL (ref 4.0–10.5)
nRBC: 0 % (ref 0.0–0.2)

## 2024-06-25 LAB — BRAIN NATRIURETIC PEPTIDE: B Natriuretic Peptide: 1585.3 pg/mL — ABNORMAL HIGH (ref 0.0–100.0)

## 2024-06-25 LAB — TROPONIN I (HIGH SENSITIVITY)
Troponin I (High Sensitivity): 20 ng/L — ABNORMAL HIGH (ref <18)
Troponin I (High Sensitivity): 10 ng/L (ref <18)

## 2024-06-25 MED ORDER — SODIUM CHLORIDE 0.9 % IV SOLN
1.0000 g | Freq: Once | INTRAVENOUS | Status: AC
  Administered 2024-06-25: 1 g via INTRAVENOUS
  Filled 2024-06-25: qty 10

## 2024-06-25 MED ORDER — POLYETHYLENE GLYCOL 3350 17 GM/SCOOP PO POWD
17.0000 g | Freq: Every day | ORAL | 0 refills | Status: AC
Start: 2024-06-25 — End: ?

## 2024-06-25 MED ORDER — POTASSIUM CHLORIDE CRYS ER 20 MEQ PO TBCR: 20.0000 meq | EXTENDED_RELEASE_TABLET | Freq: Every day | ORAL | 0 refills | Status: DC

## 2024-06-25 MED ORDER — CEPHALEXIN 500 MG PO CAPS: 500.0000 mg | ORAL_CAPSULE | Freq: Two times a day (BID) | ORAL | 0 refills | Status: DC

## 2024-06-25 MED ORDER — FUROSEMIDE 20 MG PO TABS
40.0000 mg | ORAL_TABLET | Freq: Once | ORAL | Status: AC
  Administered 2024-06-25: 40 mg via ORAL
  Filled 2024-06-25: qty 2

## 2024-06-25 NOTE — ED Notes (Signed)
 Patient ambulated, SpO2 remained above 94 % but patient was fatigued after short amount of walking.

## 2024-06-25 NOTE — ED Provider Notes (Signed)
 [place] EMERGENCY DEPARTMENT AT [facility] Provider Note   CSN: [id number] Arrival date & time: 06/25/24  1538     Patient presents with: Leg Swelling and Shortness of Breath   Dean Cantara Sr. is a 73 y.o. male.   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  Also patient describes generalized weakness and a wobbly feeling after standing up.  He has had some difficulty sleeping at times recently.  He has had persistent lower extremity swelling since last hospitalization that gets worse throughout the day and then is better in the morning (family member at bedside states only mildly better).  Patient reports that he is on iron for low hemoglobin and his kidney function was recently a bit improved.  The iron is making him constipated, causing decreased appetite and some nausea at times but no vomiting.  Patient states he was recently told to double dose of furosemide .  He took 80 mg this morning and urinated as expected.  From cardiology telephone note earlier today: Reported patient not feeling well after taking hydralazine  or isosorbide , reports that he feels bad increased weakness, feeling sick, weight 194 (however patient tells me that his weight has been near normal)       Prior to Admission medications   Medication Sig Start Date End Date Taking? Authorizing Provider  allopurinol  (ZYLOPRIM ) 100 MG tablet Take 1 tablet (100 mg total) by mouth daily. 05/10/24   Fairy Frames, MD  amiodarone  (PACERONE ) 200 MG tablet Take 1 tablet (200 mg total) by mouth daily. 09/06/22    Ursuy, Renee Lynn, PA-C  apixaban  (ELIQUIS ) 5 MG TABS tablet TAKE 1 TABLET BY MOUTH TWICE A DAY 05/01/24   Camnitz, Soyla Lunger, MD  atorvastatin  (LIPITOR) 40 MG tablet Take 40 mg by mouth daily.    [provider]  cholecalciferol  (VITAMIN D3) 25 MCG (1000 UNIT) tablet Take 1,000 Units by mouth daily. 06/13/23   [provider]  ferrous sulfate  325 (65 FE) MG tablet Take 325 mg by mouth daily. 11/08/23   [provider]  furosemide  (LASIX ) 40 MG tablet Take 1 tablet (40 mg total) by mouth daily. 05/10/24   Fairy Frames, MD  guaiFENesin -codeine  100-10 MG/5ML syrup Take 5 mLs by mouth at bedtime as needed for cough. 05/02/24   Yolande Lamar BROCKS, MD  hydrALAZINE  (APRESOLINE ) 50 MG tablet Take 1.5 tablets (75 mg total) by mouth every 8 (eight) hours. 06/09/24   [name] Morene PARAS, MD  isosorbide  mononitrate (IMDUR ) 60 MG 24 hr tablet Take 1 tablet (60 mg total) by mouth daily. 06/18/24   [name] Morene PARAS, MD    Allergies: Contrast media [iodinated contrast media] and Lisinopril     Review of Systems  Updated Vital Signs BP (!) 156/72   Pulse 84   Temp 97.8 F (36.6 C)   Resp 18   Ht 5' 6 (1.676 m)   Wt 86 kg   SpO2 99%   BMI 30.60 kg/m   Physical Exam Vitals and nursing note reviewed.  Constitutional:      General: He is not in acute distress.    Appearance: He is well-developed.  HENT:     Head: Normocephalic and atraumatic.  Eyes:  General:        Right eye: No discharge.        Left eye: No discharge.     Conjunctiva/sclera: Conjunctivae normal.  Cardiovascular:     Rate and Rhythm: Normal rate and regular rhythm.     Heart sounds: Murmur heard.  Pulmonary:     Effort: Pulmonary effort is normal.     Breath sounds: Normal breath sounds. No decreased breath sounds, wheezing or rhonchi.  Abdominal:     Palpations: Abdomen is soft.     Tenderness: There is no abdominal tenderness.  Musculoskeletal:     Cervical back: Normal range of motion and  neck supple.     Right lower leg: Edema present.     Left lower leg: Edema present.  Skin:    General: Skin is warm and dry.  Neurological:     Mental Status: He is alert.     (all labs ordered are listed, but only abnormal results are displayed) Labs Reviewed  BASIC METABOLIC PANEL WITH GFR - Abnormal; Notable for the following components:      Result Value   Sodium 134 (*)    CO2 19 (*)    Glucose, Bld 108 (*)    BUN 33 (*)    Creatinine, Ser 2.16 (*)    GFR, Estimated 31 (*)    All other components within normal limits  CBC - Abnormal; Notable for the following components:   RBC 3.84 (*)    Hemoglobin 11.4 (*)    HCT 36.1 (*)    RDW 18.0 (*)    All other components within normal limits  BRAIN NATRIURETIC PEPTIDE - Abnormal; Notable for the following components:   B Natriuretic Peptide 1,585.3 (*)    All other components within normal limits  URINALYSIS, ROUTINE W REFLEX MICROSCOPIC - Abnormal; Notable for the following components:   APPearance HAZY (*)    Leukocytes,Ua MODERATE (*)    Bacteria, UA MANY (*)    All other components within normal limits  TROPONIN I (HIGH SENSITIVITY) - Abnormal; Notable for the following components:   Troponin I (High Sensitivity) 20 (*)    All other components within normal limits  URINE CULTURE  TROPONIN I (HIGH SENSITIVITY)    EKG: None  Radiology: DG Chest Portable 1 View Result Date: 06/25/2024 CLINICAL DATA:  Increased shortness of breath and peripheral edema. EXAM: PORTABLE CHEST 1 VIEW COMPARISON:  05/07/2024 FINDINGS: Enlarged cardiac silhouette with an interval increase in size. Stable left subclavian AICD lead. Mild diffuse peribronchial thickening without significant change. Interval minimal patchy density in the right mid to upper lung zone. No acute bony abnormality. IMPRESSION: 1. Interval minimal patchy density in the right mid to upper lung zone, suspicious for pneumonia. 2. Mildly progressive cardiomegaly. 3. Stable  mild bronchitic changes. Electronically Signed   By: Elspeth Bathe M.D.   On: 06/25/2024 17:09     Procedures   Medications Ordered in the ED  furosemide  (LASIX ) tablet 40 mg (has no administration in time range)    ED Course  Patient seen and examined. History obtained directly from patient and family member, reviewed cardiology notes as well as recent inpatient notes from August.  Labs/EKG: Ordered CBC, BMP, BNP, EKG.  Imaging: Ordered chest x-ray.  Medications/Fluids: None ordered  Most recent vital signs reviewed and are as follows: BP (!) 156/72   Pulse 84   Temp 97.8 F (36.6 C)   Resp 18   Ht 5' 6 (1.676 m)  Wt 86 kg   SpO2 99%   BMI 30.60 kg/m   Initial impression: Generalized weakness in patient with heart failure.  From a CHF standpoint, he does not appear to be severely fluid overloaded currently.  He does have some lower extremity edema.  Lungs are clear.  He does feel short of breath at times but this is not his primary complaint.  More vague generalized weakness, unsteadiness, not being able to do what he used to be able to do.  Will assess fluid status further.  Patient did take 80 of Lasix  earlier.  Will need to check renal function.  There were some cardiology recommendations placed by cardiology APP [facility]) this afternoon, after arrival to the hospital:  Increase Lasix  to 80 mg daily, add 20 KCL daily OK to decrease Imdur  to 15 mg daily and hydralazine  to 25 mg tid.  He will need a BMET in 10-14 days  5:22 PM Notified by patient's paramedic that he is unable to weigh patient as he is unable to find a scale.   Labs personally reviewed and interpreted including: CBC shows normal white blood cell count, hemoglobin 11.4 which is slightly higher than previous; BMP with normal potassium, creatinine slightly higher than baseline at 2.16 with a BUN of 33 today; troponin slightly lower than baseline at 20; BNP slightly lower than recent baseline at  1580.  Imaging personally visualized and interpreted including: Chest x-ray with interval minimal patchy density right mid upper lung suspicious for pneumonia, patient's symptoms are not definitive for pneumonia as he does not have fever or cough worsen baseline.  Most current vital signs reviewed and are as follows: BP (!) 156/72   Pulse 84   Temp 97.8 F (36.6 C)   Resp 18   Ht 5' 6 (1.676 m)   Wt 86 kg   SpO2 99%   BMI 30.60 kg/m   Plan: Will check urine given history of UTI which could be contributing.  Will also ask for patient be ambulated on pulse oximetry to evaluate for desaturation.  7:12 PM Reassessment performed. Patient appears stable.  Urine is currently being run.  He ambulated without desaturation.  Fatigue endorsed.  Reviewed pertinent lab work and imaging with patient at bedside. Questions answered.   Most current vital signs reviewed and are as follows: BP (!) 158/75   Pulse 81   Temp 97.8 F (36.6 C)   Resp (!) 29   Ht 5' 6 (1.676 m)   Wt 86 kg   SpO2 100%   BMI 30.60 kg/m   Plan: Will give dose of 40mg  p.o. Lasix   Will evaluate for UTI, consider antibiotics, however patient's clinical presentation is not consistent with pneumonia.  He can likely follow-up as outpatient given well appearance.   10:10 PM Reassessment performed. Patient appears stable, comfortable.  Discussed results and dispo plan with patient and daughter by telephone.  Labs personally reviewed and interpreted including: Urine does show 21-50 white blood cells with many bacteria, clean-catch.  Previous urine culture multiple species, culture before that showed pansensitive E. coli.  Reviewed pertinent lab work and imaging with patient at bedside. Questions answered.   Most current vital signs reviewed and are as follows: BP (!) 138/58 (BP Location: Right Arm)   Pulse 76   Temp 97.8 F (36.6 C) (Oral)   Resp (!) 30   Ht 5' 6 (1.676 m)   Wt 86 kg   SpO2 100%   BMI 30.60  kg/m   Plan:  Discharge to home.  Patient received a dose of IV Rocephin  and will be discharged home on Keflex .  In regards to cardiology recommendations, I provided a written copy of this for the patient.  This includes decreased dose of Imdur , decreased dose of hydralazine , increased dose of furosemide , addition of potassium supplementation.  Prescriptions written for: Keflex , MiraLAX   Other home care instructions discussed: Elevate legs, monitor symptoms.  ED return instructions discussed: Return with worsening shortness of breath, worsening weakness, fever or other infectious symptoms.  Follow-up instructions discussed: Patient encouraged to follow-up with their PCP and cardiologist for follow-up.  Patient made aware of need for repeat basic metabolic panel in 10-14 days.                                  Medical Decision Making Amount and/or Complexity of Data Reviewed Labs: ordered. Radiology: ordered.  Risk OTC drugs. Prescription drug management.   Patient with medical history as above presents today for generalized malaise, feeling bad, shortness of breath.  Patient did contact his cardiologist and they provided recommendations as above.  From a heart failure standpoint, patient does not appear to be severely fluid overloaded, and he agrees with his symptoms not being as severe as previous admission.  BNP is lower, chest x-ray without pulmonary edema.  Patient was ambulated and did not desat.  Patient was given a dose of oral Lasix  here.  He has been having trouble with persistent lower extremity edema likely related to his heart failure.  Patient also complains of generalized malaise.  He does appear to have a recurrent UTI which may be contributing.  This was treated with Rocephin .  White blood cell count was normal.  No concern for sepsis at this time.  Placed on Keflex , previous culture grew out pansensitive E. coli.  There was question of a small patchy density on the chest  x-ray however patient's symptoms are not consistent with pneumonia and I have low concern for pneumonia at this time.  The patient was urged to return to the Emergency Department immediately with worsening of current symptoms, worsening abdominal pain, persistent vomiting, blood noted in stools, fever, or any other concerns. The patient verbalized understanding.       Final diagnoses:  Shortness of breath  Chronic combined systolic and diastolic heart failure (HCC)  Bilateral lower extremity edema  Chronic kidney disease, unspecified CKD stage  Acute cystitis without hematuria    ED Discharge Orders          Ordered    cephALEXin  (KEFLEX ) 500 MG capsule  2 times daily        06/25/24 2112    polyethylene glycol powder (GLYCOLAX /MIRALAX ) 17 GM/SCOOP powder  Daily        06/25/24 2112    potassium chloride  SA (KLOR-CON  M) 20 MEQ tablet  Daily        06/25/24 2115               Desiderio Chew, PA-C 06/25/24 2345    Melvenia Motto, MD 06/26/24 0011

## 2024-06-25 NOTE — ED Triage Notes (Signed)
 PT arrives via POV. PT reports hx of CHF. He states he has been experiencing increased swelling and sob. PT reports some nausea as well. Pt denies CP. Pt is AxOx4.

## 2024-06-25 NOTE — Discharge Instructions (Addendum)
 In regards to heart failure, your lab workup and chest x-ray today were baseline.  Your creatinine/kidney function was a little bit worse today but still near your normal range.  We do not see any severe problems which would warrant admission today and your oxygen level remains normal when you walked.  Your urine does look infected again.  Urine culture sent to confirm.  Your cardiologist provider has recommended that you do the following:  Increase Lasix  to 80 mg daily, add 20mEq Potassium daily Decrease Imdur  to 15 mg daily and hydralazine  to 25 mg three times a day  I have sent in an antibiotic Keflex  for urine infection.  I have sent in potassium supplement.  I have also sent in MiraLAX  for constipation.  Please return to the emergency department if you have worsening swelling, severe lightheadedness or if you pass out, develop chest pain or worsening shortness of breath.  You will need to have your basic metabolic panel rechecked in 10 to 14 days.

## 2024-06-25 NOTE — Telephone Encounter (Signed)
 Patients family called to report  Patient does not feel well after taking hydralazine  or isosorbide   -reports he just feels bad, increased weakness describes as feeling sick -pt describes as not able to function Declines dizziness  No vitals (seen by PCP and reports b/p stable) Unable to check b/p while on the phone  Weight 194, normal weight 185 No CP  Mild SOB Mild edema (daughter states his feet are really puffy) Reports he is unable to sleep AM meds was taking with food Reports he did not take  new medication (imdur ) and still felt this way  Pt is currently taking 80 mg of lasix  Reports he missed 2-3 doses this week  -pt to send remote transmission    Please advise

## 2024-06-26 NOTE — Telephone Encounter (Signed)
 Returned call to patient Also noticed during chart review pt is currently in ER

## 2024-06-27 NOTE — Progress Notes (Signed)
 [facility] FOLLOW UP CLINIC NOTE  Referring Physician: [name], MD  Primary Care: [name], MD Primary Cardiologist:  HPI: Dean Shaheen Sr. is a 73 y.o. male who presents for follow up of chronic systolic heart failure.      Cardiac history dates back to 2017 when he suffered VT/VF arrest requiring CPR/defibrillation. Cardiac cath demonstrated 90% ostial RCA and 100% m to d RCA with collaterals. CAD treated medically. Echo with LVEF 35-40% and AK of basal inferior and apical myocardium. Later underwent single chamber medtronic ICD in 09/2016. Since then EF has been in range of 30-35%.    Admitted in 12/23 d/t ICD shock for VT/VF in setting of amiodarone  noncompliance.   He was admitted in 2/25 with GI bleed requiring transfusions after polypectomy by hot snare with clips.   He was admitted 07/27-07/29/25 with acute on chronic CHF. He was diuresed with IV lasix .   Presented 04/2024 with shortness of breath and dysuria (prior UTI). Became cold on exam with uptrending lactic acid and underwent RHC showing severely elevated filling pressures and reduced cardiac index. Required IV milrinone  with improvement, weaned off with aggressive diuresis prior to discharge.      SUBJECTIVE:  Patient overall reports that he is feeling fair.  He does report that he is having some worsening lower extremity edema as well as orthopnea at night.  He has been taking his medications, but had not been taking the correct dose of hydralazine  after this was recently increased after his TEE.  We discussed the rationale for strict blood pressure control in the setting of his mitral regurgitation as well as TEE showing moderate mitral regurgitation.  PMH, current medications, allergies, social history, and family history reviewed in epic.  PHYSICAL EXAM: Vitals:   06/18/24 0927  BP: 134/66  Pulse: 89  SpO2: 99%   GENERAL: NAD, well appearing PULM:  Normal work of breathing,  CTAB CARDIAC:  JVP: mildly elevated         Normal rate with regular rhythm. Systolic murmur.  Trace edema. Warm and well perfused extremities. ABDOMEN: Soft, non-tender, non-distended. NEUROLOGIC: Patient is oriented x3 with no focal or lateralizing neurologic deficits.     DATA REVIEW  ECG 05/05/2024: NSIVCD, LBBB apperance, sinus  ECHO: 05/08/2024: LVEF 35-40%, mild grade II DD, mildly reduced RV function, posterior leaflet restricted with moderate to severe MR   CATH: 05/06/2024: RA 15, PA 64/27 (39), PCWP 25 with v waves to 40, TD CO/CI 3.6/1.8, PAPi 2.5  08/2016: RCA occluded with left to right collaterals  ASSESSMENT & PLAN:  Chronic systolic heart failure: Primarily ischemic, prior history of infarcted RCA. TEE with more moderate MR by PISA, valve area, systolic blunting of pulmonary veins. Will continue with medical therapy for now.  - TEE for evaluation of MR as above - Increase hydralazine  to 75 mg 3 times daily - Continue Imdur  60 mg daily - Increase Lasix  to 40 mg twice daily for 1 week then back to 40 mg daily - No SGLT-2 with recent UTI - CKD limits MRA and RAASi - ICD in place - Titrate medical therapy prior to repeating echocardiogram.  If persistently reduced may benefit from CRT upgrade  Mitral regurgitation: 2/2 ischemic disease.  Moderate on most recent TEE - TEE as above - Continue diuresis and med optimization  PAF - Previously diagnosed on device - SR today. No AF on device check 08/04. - Continue amio 200 mg daily  - Continue Eliquis  5 mg  twice a day.     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 - Recent creatinine 1.8, continue diuresis as above - Consider rechallenge with SGLT-2 in the future  CAD: Single vessel RCA disease. - No anginal pain    Follow up in 2 months  [name], MD Advanced [facility] Failure Mechanical Circulatory Support 06/27/24

## 2024-06-28 LAB — URINE CULTURE: Culture: >=100000 — AB

## 2024-06-29 ENCOUNTER — Telehealth (HOSPITAL_BASED_OUTPATIENT_CLINIC_OR_DEPARTMENT_OTHER): Payer: Self-pay | Admitting: *Deleted

## 2024-06-29 NOTE — Telephone Encounter (Signed)
 Post ED Visit - Positive Culture Follow-up  Culture report reviewed by antimicrobial stewardship pharmacist: [facility] []  Rankin Dee, Pharm.D. []  Venetia Gully, Pharm.D., BCPS AQ-ID []  Garrel Crews, Pharm.D., BCPS []  Almarie Lunger, Pharm.D., BCPS []  [place], [address].D., BCPS, AAHIVP []  Rosaline Bihari, Pharm.D., BCPS, AAHIVP []  Vernell Meier, PharmD, BCPS []  Latanya Hint, PharmD, BCPS []  Donald Medley, PharmD, BCPS []  Rocky Bold, PharmD []  Dorothyann Alert, PharmD, BCPS [x]  Dorn Poot, PharmD  [facility] []  Rosaline Edison, PharmD []  Romona Bliss, PharmD []  Dolphus Roller, PharmD []  Veva Seip, Rph []  Vernell Daunt) Leonce, PharmD []  Eva Allis, PharmD []  Rosaline Millet, PharmD []  Iantha Batch, PharmD []  Arvin Gauss, PharmD []  Wanda Hasting, PharmD []  Ronal Rav, PharmD []  Rocky Slade, PharmD []  Bard Jeans, PharmD   Positive urine culture Treated with Cephalexin , organism sensitive to the same and no further patient follow-up is required at this time.  Dean [name] 06/29/2024, 10:13 AM

## 2024-07-08 ENCOUNTER — Telehealth (HOSPITAL_COMMUNITY): Payer: Self-pay | Admitting: Cardiology

## 2024-07-08 NOTE — Telephone Encounter (Signed)
 Hydralazine  50mg  1/2 tab BID Imdur  60 mg 1 tab daily (1/2 tab taken this AM plans to take 1/2 tab this  PM) Lasix  40 mg (2 tabs daily)

## 2024-07-08 NOTE — Telephone Encounter (Signed)
 Patient called to report increase in weakness and depression since starting new meds  Reports he feels like he drags along all day  Unable to rest at night  Referring to hydralazine /isosorbide   B/p 128/54 HR68-today Weight stable  Reports he was seen in the ER and told his medications could be too strong   Please advise

## 2024-07-10 ENCOUNTER — Emergency Department (HOSPITAL_COMMUNITY)

## 2024-07-10 ENCOUNTER — Encounter (HOSPITAL_COMMUNITY): Payer: Self-pay

## 2024-07-10 ENCOUNTER — Other Ambulatory Visit: Payer: Self-pay

## 2024-07-10 ENCOUNTER — Inpatient Hospital Stay (HOSPITAL_COMMUNITY)
Admission: EM | Admit: 2024-07-10 | Discharge: 2024-07-19 | DRG: 291 | Disposition: A | Discharge status: Home Health | Attending: Internal Medicine | Admitting: Internal Medicine

## 2024-07-10 DIAGNOSIS — E876 Hypokalemia: Secondary | ICD-10-CM | POA: Diagnosis not present

## 2024-07-10 DIAGNOSIS — I5020 Unspecified systolic (congestive) heart failure: Secondary | ICD-10-CM | POA: Diagnosis present

## 2024-07-10 DIAGNOSIS — I5023 Acute on chronic systolic (congestive) heart failure: Secondary | ICD-10-CM | POA: Diagnosis present

## 2024-07-10 DIAGNOSIS — R5381 Other malaise: Secondary | ICD-10-CM | POA: Diagnosis present

## 2024-07-10 DIAGNOSIS — R9389 Abnormal findings on diagnostic imaging of other specified body structures: Secondary | ICD-10-CM | POA: Diagnosis present

## 2024-07-10 DIAGNOSIS — Z8719 Personal history of other diseases of the digestive system: Secondary | ICD-10-CM

## 2024-07-10 DIAGNOSIS — E119 Type 2 diabetes mellitus without complications: Secondary | ICD-10-CM | POA: Diagnosis not present

## 2024-07-10 DIAGNOSIS — R911 Solitary pulmonary nodule: Secondary | ICD-10-CM | POA: Diagnosis present

## 2024-07-10 DIAGNOSIS — Z888 Allergy status to other drugs, medicaments and biological substances status: Secondary | ICD-10-CM

## 2024-07-10 DIAGNOSIS — Z8674 Personal history of sudden cardiac arrest: Secondary | ICD-10-CM

## 2024-07-10 DIAGNOSIS — Z515 Encounter for palliative care: Secondary | ICD-10-CM

## 2024-07-10 DIAGNOSIS — Z8249 Family history of ischemic heart disease and other diseases of the circulatory system: Secondary | ICD-10-CM

## 2024-07-10 DIAGNOSIS — D638 Anemia in other chronic diseases classified elsewhere: Secondary | ICD-10-CM | POA: Diagnosis present

## 2024-07-10 DIAGNOSIS — D696 Thrombocytopenia, unspecified: Secondary | ICD-10-CM | POA: Diagnosis present

## 2024-07-10 DIAGNOSIS — I480 Paroxysmal atrial fibrillation: Secondary | ICD-10-CM | POA: Diagnosis present

## 2024-07-10 DIAGNOSIS — E669 Obesity, unspecified: Secondary | ICD-10-CM | POA: Diagnosis present

## 2024-07-10 DIAGNOSIS — T733XXA Exhaustion due to excessive exertion, initial encounter: Secondary | ICD-10-CM | POA: Diagnosis not present

## 2024-07-10 DIAGNOSIS — E66811 Obesity, class 1: Secondary | ICD-10-CM

## 2024-07-10 DIAGNOSIS — Z23 Encounter for immunization: Secondary | ICD-10-CM

## 2024-07-10 DIAGNOSIS — E440 Moderate protein-calorie malnutrition: Secondary | ICD-10-CM | POA: Diagnosis present

## 2024-07-10 DIAGNOSIS — R600 Localized edema: Secondary | ICD-10-CM | POA: Diagnosis not present

## 2024-07-10 DIAGNOSIS — I447 Left bundle-branch block, unspecified: Secondary | ICD-10-CM | POA: Diagnosis present

## 2024-07-10 DIAGNOSIS — Z6834 Body mass index (BMI) 34.0-34.9, adult: Secondary | ICD-10-CM

## 2024-07-10 DIAGNOSIS — E1122 Type 2 diabetes mellitus with diabetic chronic kidney disease: Secondary | ICD-10-CM | POA: Diagnosis present

## 2024-07-10 DIAGNOSIS — I5022 Chronic systolic (congestive) heart failure: Secondary | ICD-10-CM | POA: Diagnosis not present

## 2024-07-10 DIAGNOSIS — Z95810 Presence of automatic (implantable) cardiac defibrillator: Secondary | ICD-10-CM | POA: Diagnosis present

## 2024-07-10 DIAGNOSIS — I509 Heart failure, unspecified: Secondary | ICD-10-CM | POA: Diagnosis present

## 2024-07-10 DIAGNOSIS — M1990 Unspecified osteoarthritis, unspecified site: Secondary | ICD-10-CM | POA: Diagnosis present

## 2024-07-10 DIAGNOSIS — D631 Anemia in chronic kidney disease: Secondary | ICD-10-CM | POA: Diagnosis present

## 2024-07-10 DIAGNOSIS — N184 Chronic kidney disease, stage 4 (severe): Secondary | ICD-10-CM | POA: Diagnosis present

## 2024-07-10 DIAGNOSIS — N189 Chronic kidney disease, unspecified: Secondary | ICD-10-CM

## 2024-07-10 DIAGNOSIS — Z66 Do not resuscitate: Secondary | ICD-10-CM | POA: Diagnosis not present

## 2024-07-10 DIAGNOSIS — Z634 Disappearance and death of family member: Secondary | ICD-10-CM

## 2024-07-10 DIAGNOSIS — E785 Hyperlipidemia, unspecified: Secondary | ICD-10-CM | POA: Diagnosis present

## 2024-07-10 DIAGNOSIS — K7460 Unspecified cirrhosis of liver: Secondary | ICD-10-CM | POA: Diagnosis present

## 2024-07-10 DIAGNOSIS — E1165 Type 2 diabetes mellitus with hyperglycemia: Secondary | ICD-10-CM | POA: Diagnosis present

## 2024-07-10 DIAGNOSIS — I255 Ischemic cardiomyopathy: Secondary | ICD-10-CM | POA: Diagnosis present

## 2024-07-10 DIAGNOSIS — N179 Acute kidney failure, unspecified: Secondary | ICD-10-CM | POA: Diagnosis present

## 2024-07-10 DIAGNOSIS — Z809 Family history of malignant neoplasm, unspecified: Secondary | ICD-10-CM

## 2024-07-10 DIAGNOSIS — I10 Essential (primary) hypertension: Secondary | ICD-10-CM | POA: Diagnosis not present

## 2024-07-10 DIAGNOSIS — I081 Rheumatic disorders of both mitral and tricuspid valves: Secondary | ICD-10-CM | POA: Diagnosis present

## 2024-07-10 DIAGNOSIS — E872 Acidosis, unspecified: Secondary | ICD-10-CM | POA: Diagnosis present

## 2024-07-10 DIAGNOSIS — I5043 Acute on chronic combined systolic (congestive) and diastolic (congestive) heart failure: Secondary | ICD-10-CM | POA: Diagnosis not present

## 2024-07-10 DIAGNOSIS — R590 Localized enlarged lymph nodes: Secondary | ICD-10-CM | POA: Diagnosis present

## 2024-07-10 DIAGNOSIS — Z9889 Other specified postprocedural states: Secondary | ICD-10-CM

## 2024-07-10 DIAGNOSIS — J45909 Unspecified asthma, uncomplicated: Secondary | ICD-10-CM | POA: Diagnosis present

## 2024-07-10 DIAGNOSIS — I5041 Acute combined systolic (congestive) and diastolic (congestive) heart failure: Secondary | ICD-10-CM | POA: Diagnosis not present

## 2024-07-10 DIAGNOSIS — Z7901 Long term (current) use of anticoagulants: Secondary | ICD-10-CM

## 2024-07-10 DIAGNOSIS — Z7189 Other specified counseling: Secondary | ICD-10-CM | POA: Diagnosis not present

## 2024-07-10 DIAGNOSIS — K8020 Calculus of gallbladder without cholecystitis without obstruction: Secondary | ICD-10-CM | POA: Diagnosis present

## 2024-07-10 DIAGNOSIS — R188 Other ascites: Secondary | ICD-10-CM | POA: Diagnosis present

## 2024-07-10 DIAGNOSIS — I493 Ventricular premature depolarization: Secondary | ICD-10-CM | POA: Diagnosis not present

## 2024-07-10 DIAGNOSIS — Z79899 Other long term (current) drug therapy: Secondary | ICD-10-CM

## 2024-07-10 DIAGNOSIS — Z1152 Encounter for screening for COVID-19: Secondary | ICD-10-CM

## 2024-07-10 DIAGNOSIS — R918 Other nonspecific abnormal finding of lung field: Secondary | ICD-10-CM | POA: Diagnosis present

## 2024-07-10 DIAGNOSIS — I130 Hypertensive heart and chronic kidney disease with heart failure and stage 1 through stage 4 chronic kidney disease, or unspecified chronic kidney disease: Secondary | ICD-10-CM | POA: Diagnosis present

## 2024-07-10 DIAGNOSIS — M109 Gout, unspecified: Secondary | ICD-10-CM | POA: Diagnosis present

## 2024-07-10 DIAGNOSIS — R570 Cardiogenic shock: Secondary | ICD-10-CM | POA: Diagnosis not present

## 2024-07-10 DIAGNOSIS — G4733 Obstructive sleep apnea (adult) (pediatric): Secondary | ICD-10-CM | POA: Diagnosis present

## 2024-07-10 DIAGNOSIS — Z87440 Personal history of urinary (tract) infections: Secondary | ICD-10-CM

## 2024-07-10 DIAGNOSIS — Z8619 Personal history of other infectious and parasitic diseases: Secondary | ICD-10-CM

## 2024-07-10 DIAGNOSIS — Z860100 Personal history of colon polyps, unspecified: Secondary | ICD-10-CM

## 2024-07-10 DIAGNOSIS — Z8701 Personal history of pneumonia (recurrent): Secondary | ICD-10-CM

## 2024-07-10 DIAGNOSIS — Z91041 Radiographic dye allergy status: Secondary | ICD-10-CM

## 2024-07-10 DIAGNOSIS — I2510 Atherosclerotic heart disease of native coronary artery without angina pectoris: Secondary | ICD-10-CM | POA: Diagnosis present

## 2024-07-10 DIAGNOSIS — K769 Liver disease, unspecified: Secondary | ICD-10-CM | POA: Diagnosis present

## 2024-07-10 LAB — URINALYSIS, W/ REFLEX TO CULTURE (INFECTION SUSPECTED)
Bilirubin Urine: NEGATIVE
Glucose, UA: NEGATIVE mg/dL
Hgb urine dipstick: NEGATIVE
Ketones, ur: NEGATIVE mg/dL
Nitrite: NEGATIVE
Protein, ur: 100 mg/dL — AB
Specific Gravity, Urine: 1.013 (ref 1.005–1.030)
pH: 5 (ref 5.0–8.0)

## 2024-07-10 LAB — I-STAT VENOUS BLOOD GAS, ED
Acid-base deficit: 4 mmol/L — ABNORMAL HIGH (ref 0.0–2.0)
Bicarbonate: 20.4 mmol/L (ref 20.0–28.0)
Calcium, Ion: 1.21 mmol/L (ref 1.15–1.40)
HCT: 40 % (ref 39.0–52.0)
Hemoglobin: 13.6 g/dL (ref 13.0–17.0)
O2 Saturation: 76 %
Potassium: 4.1 mmol/L (ref 3.5–5.1)
Sodium: 138 mmol/L (ref 135–145)
TCO2: 21 mmol/L — ABNORMAL LOW (ref 22–32)
pCO2, Ven: 34.4 mmHg — ABNORMAL LOW (ref 44–60)
pH, Ven: 7.382 (ref 7.25–7.43)
pO2, Ven: 41 mmHg (ref 32–45)

## 2024-07-10 LAB — CBC WITH DIFFERENTIAL/PLATELET
Abs Immature Granulocytes: 0.04 K/uL (ref 0.00–0.07)
Basophils Absolute: 0.1 K/uL (ref 0.0–0.1)
Basophils Relative: 1 %
Eosinophils Absolute: 0.2 K/uL (ref 0.0–0.5)
Eosinophils Relative: 2 %
HCT: 37.2 % — ABNORMAL LOW (ref 39.0–52.0)
Hemoglobin: 11.8 g/dL — ABNORMAL LOW (ref 13.0–17.0)
Immature Granulocytes: 1 %
Lymphocytes Relative: 9 %
Lymphs Abs: 0.8 K/uL (ref 0.7–4.0)
MCH: 29.9 pg (ref 26.0–34.0)
MCHC: 31.7 g/dL (ref 30.0–36.0)
MCV: 94.4 fL (ref 80.0–100.0)
Monocytes Absolute: 0.8 K/uL (ref 0.1–1.0)
Monocytes Relative: 9 %
Neutro Abs: 6.8 K/uL (ref 1.7–7.7)
Neutrophils Relative %: 78 %
Platelets: 149 K/uL — ABNORMAL LOW (ref 150–400)
RBC: 3.94 MIL/uL — ABNORMAL LOW (ref 4.22–5.81)
RDW: 18 % — ABNORMAL HIGH (ref 11.5–15.5)
WBC: 8.6 K/uL (ref 4.0–10.5)
nRBC: 0 % (ref 0.0–0.2)

## 2024-07-10 LAB — I-STAT CHEM 8, ED
BUN: 51 mg/dL — ABNORMAL HIGH (ref 8–23)
Calcium, Ion: 1.22 mmol/L (ref 1.15–1.40)
Chloride: 105 mmol/L (ref 98–111)
Creatinine, Ser: 2.5 mg/dL — ABNORMAL HIGH (ref 0.61–1.24)
Glucose, Bld: 97 mg/dL (ref 70–99)
HCT: 41 % (ref 39.0–52.0)
Hemoglobin: 13.9 g/dL (ref 13.0–17.0)
Potassium: 4.3 mmol/L (ref 3.5–5.1)
Sodium: 138 mmol/L (ref 135–145)
TCO2: 20 mmol/L — ABNORMAL LOW (ref 22–32)

## 2024-07-10 LAB — COMPREHENSIVE METABOLIC PANEL WITH GFR
ALT: 35 U/L (ref 0–44)
AST: 31 U/L (ref 15–41)
Albumin: 3.5 g/dL (ref 3.5–5.0)
Alkaline Phosphatase: 89 U/L (ref 38–126)
Anion gap: 11 (ref 5–15)
BUN: 50 mg/dL — ABNORMAL HIGH (ref 8–23)
CO2: 19 mmol/L — ABNORMAL LOW (ref 22–32)
Calcium: 9.9 mg/dL (ref 8.9–10.3)
Chloride: 104 mmol/L (ref 98–111)
Creatinine, Ser: 2.49 mg/dL — ABNORMAL HIGH (ref 0.61–1.24)
GFR, Estimated: 26 mL/min — ABNORMAL LOW (ref >60)
Glucose, Bld: 103 mg/dL — ABNORMAL HIGH (ref 70–99)
Potassium: 4.3 mmol/L (ref 3.5–5.1)
Sodium: 134 mmol/L — ABNORMAL LOW (ref 135–145)
Total Bilirubin: 3.3 mg/dL — ABNORMAL HIGH (ref 0.0–1.2)
Total Protein: 7.1 g/dL (ref 6.5–8.1)

## 2024-07-10 LAB — RESP PANEL BY RT-PCR (RSV, FLU A&B, COVID)  RVPGX2
Influenza A by PCR: NEGATIVE
Influenza B by PCR: NEGATIVE
Resp Syncytial Virus by PCR: NEGATIVE
SARS Coronavirus 2 by RT PCR: NEGATIVE

## 2024-07-10 LAB — LACTIC ACID, PLASMA
Lactic Acid, Venous: 1.7 mmol/L (ref 0.5–1.9)
Lactic Acid, Venous: 2.3 mmol/L (ref 0.5–1.9)

## 2024-07-10 LAB — BRAIN NATRIURETIC PEPTIDE: B Natriuretic Peptide: 1952.1 pg/mL — ABNORMAL HIGH (ref 0.0–100.0)

## 2024-07-10 MED ORDER — INFLUENZA VAC SPLIT HIGH-DOSE 0.5 ML IM SUSY
0.5000 mL | PREFILLED_SYRINGE | INTRAMUSCULAR | Status: AC
  Administered 2024-07-11: 0.5 mL via INTRAMUSCULAR
  Filled 2024-07-10 (×2): qty 0.5

## 2024-07-10 MED ORDER — ATORVASTATIN CALCIUM 40 MG PO TABS
40.0000 mg | ORAL_TABLET | Freq: Every day | ORAL | Status: DC
  Administered 2024-07-10 – 2024-07-19 (×10): 40 mg via ORAL
  Filled 2024-07-10 (×11): qty 1

## 2024-07-10 MED ORDER — SODIUM CHLORIDE 0.9 % IV SOLN: 250.0000 mL | INTRAVENOUS | Status: AC | PRN

## 2024-07-10 MED ORDER — FUROSEMIDE 10 MG/ML IJ SOLN
80.0000 mg | Freq: Two times a day (BID) | INTRAMUSCULAR | Status: DC
  Administered 2024-07-10 (×2): 80 mg via INTRAVENOUS
  Filled 2024-07-10 (×2): qty 8

## 2024-07-10 MED ORDER — SODIUM CHLORIDE 0.9% FLUSH: 3.0000 mL | INTRAVENOUS | Status: DC | PRN

## 2024-07-10 MED ORDER — APIXABAN 5 MG PO TABS
5.0000 mg | ORAL_TABLET | Freq: Two times a day (BID) | ORAL | Status: DC
  Administered 2024-07-10 – 2024-07-19 (×19): 5 mg via ORAL
  Filled 2024-07-10 (×20): qty 1

## 2024-07-10 MED ORDER — ONDANSETRON HCL 4 MG/2ML IJ SOLN: 4.0000 mg | Freq: Four times a day (QID) | INTRAMUSCULAR | Status: DC | PRN

## 2024-07-10 MED ORDER — AMIODARONE HCL 200 MG PO TABS
200.0000 mg | ORAL_TABLET | Freq: Every day | ORAL | Status: DC
Start: 2024-07-10 — End: 2024-07-19
  Administered 2024-07-10 – 2024-07-19 (×10): 200 mg via ORAL
  Filled 2024-07-10 (×11): qty 1

## 2024-07-10 MED ORDER — ACETAMINOPHEN 325 MG PO TABS
650.0000 mg | ORAL_TABLET | ORAL | Status: DC | PRN
  Administered 2024-07-16: 650 mg via ORAL
  Filled 2024-07-10: qty 2

## 2024-07-10 MED ORDER — FUROSEMIDE 10 MG/ML IJ SOLN
60.0000 mg | Freq: Once | INTRAMUSCULAR | Status: AC
  Administered 2024-07-10: 60 mg via INTRAVENOUS
  Filled 2024-07-10: qty 6

## 2024-07-10 MED ORDER — FUROSEMIDE 10 MG/ML IJ SOLN
160.0000 mg | Freq: Two times a day (BID) | INTRAVENOUS | Status: AC
Start: 2024-07-11 — End: ?
  Administered 2024-07-11: 160 mg via INTRAVENOUS
  Filled 2024-07-10 (×3): qty 16

## 2024-07-10 MED ORDER — FERROUS SULFATE 325 (65 FE) MG PO TABS
325.0000 mg | ORAL_TABLET | Freq: Every day | ORAL | Status: DC
  Administered 2024-07-10 – 2024-07-19 (×10): 325 mg via ORAL
  Filled 2024-07-10 (×11): qty 1

## 2024-07-10 MED ORDER — SODIUM CHLORIDE 0.9% FLUSH
3.0000 mL | Freq: Two times a day (BID) | INTRAVENOUS | Status: DC
  Administered 2024-07-10 – 2024-07-19 (×15): 3 mL via INTRAVENOUS

## 2024-07-10 MED ORDER — ONDANSETRON HCL 4 MG/2ML IJ SOLN
4.0000 mg | Freq: Once | INTRAMUSCULAR | Status: AC
  Administered 2024-07-10: 4 mg via INTRAVENOUS
  Filled 2024-07-10: qty 2

## 2024-07-10 MED ORDER — POTASSIUM CHLORIDE CRYS ER 20 MEQ PO TBCR: 20.0000 meq | EXTENDED_RELEASE_TABLET | Freq: Every day | ORAL | Status: DC

## 2024-07-10 NOTE — Consult Note (Signed)
 Advanced Heart Failure Team Consult Note   Primary Physician: [name], MD Cardiologist:  Vinie JAYSON Maxcy, MD [facility]: Dr. [name]   Reason for Consultation: acute on chronic systolic heart failure  HPI:    Dean Lange Sr. is seen today for evaluation of acute on chronic systolic heart failure at the request of Dr. Laurice.   Pt is a 73 y/o male w/ chronic systolic heart failure due to ICM. Cardiac history dates back to 2017 when he suffered VT/VF arrest requiring CPR/defibrillation. Cardiac cath demonstrated 90% ostial RCA and 100% m to d RCA with collaterals. CAD treated medically. Echo with LVEF 35-40% and AK of basal inferior and apical myocardium. Later underwent single chamber medtronic ICD in 09/2016. Since then EF has been in range of 30-35%.    Admitted in 12/23 d/t ICD shock for VT/VF in setting of amiodarone  noncompliance.   He was admitted in 2/25 with GI bleed requiring transfusions after polypectomy by hot snare with clips.   He was admitted 07/27-07/29/25 with acute on chronic CHF. He was diuresed with IV lasix .    Admitted 8/25 w/ low output HF. RHC showed severely elevated filling pressures and reduced cardiac index. Required IV milrinone  with improvement, weaned off with aggressive diuresis prior to discharge.   Outpatient TEE 9/25 to assess MV showed moderate MR by PISA method with systolic blunting of the pulmonary veins,  no reversal, short posterior leaflet, poor coaptation, moderately reduced LVEF, No LAA thrombus, severe lead related TR.  At last OV, both Imdur  and hydralazine  were increased and pt reported not tolerating dose adjustments well, noting increased fatigue and weakness.   Pt now presents to the ED w/ complaints of worsening HF symptoms, w/ generalized malaise, increasing LEE and orthopnea. BNP 1900. Initial labs concerning for low ouput w/ AKI, SCr up from 1.8 1 month ago to 2.5, CO2 19. However LA is ok at 1.7 and LFTs also WNL.   Chest CT  shows 2. 7 mm posterior right pulmonary nodule is new since prior study, Mild mediastinal lymphadenopathy  CT + heterogeneous liver with irregular contour suggesting cirrhosis, new subtle hypodense lesions in the hepatic dome and lateral segment and probably third lesion in the caudate lobe>>Imaging features are concerning for metastatic disease.   He is being admitted by internal Medicine. AHF team asked to assist w/ HF management.     Home Medications Prior to Admission medications   Medication Sig Start Date End Date Taking? Authorizing Provider  allopurinol  (ZYLOPRIM ) 300 MG tablet Take 150 mg by mouth daily. 06/25/24  Yes [provider]  amiodarone  (PACERONE ) 200 MG tablet Take 1 tablet (200 mg total) by mouth daily. 09/06/22  Yes Leverne Charlies Helling, PA-C  apixaban  (ELIQUIS ) 5 MG TABS tablet TAKE 1 TABLET BY MOUTH TWICE A DAY 05/01/24  Yes Camnitz, Will Gladis, MD  atorvastatin  (LIPITOR) 40 MG tablet Take 40 mg by mouth daily.   Yes [provider]  cholecalciferol  (VITAMIN D3) 25 MCG (1000 UNIT) tablet Take 1,000 Units by mouth daily. 06/13/23  Yes [provider]  ferrous sulfate  325 (65 FE) MG tablet Take 325 mg by mouth daily. 11/08/23  Yes [provider]  furosemide  (LASIX ) 40 MG tablet Take 1 tablet (40 mg total) by mouth daily. 05/10/24  Yes Fairy Frames, MD  hydrALAZINE  (APRESOLINE ) 50 MG tablet Take 1.5 tablets (75 mg total) by mouth every 8 (eight) hours. Patient taking differently: Take 25 mg by mouth in the morning and  at bedtime. 06/09/24  Yes [name] Morene PARAS, MD  isosorbide  mononitrate (IMDUR ) 60 MG 24 hr tablet Take 1 tablet (60 mg total) by mouth daily. Patient taking differently: Take 30 mg by mouth in the morning and at bedtime. 06/18/24  Yes [name] Morene PARAS, MD  polyethylene glycol powder (GLYCOLAX /MIRALAX ) 17 GM/SCOOP powder Take 17 g by mouth daily. Patient taking differently: Take 17 g by mouth daily as needed for mild  constipation. 06/25/24  Yes Desiderio Chew, PA-C  potassium chloride  SA (KLOR-CON  M) 20 MEQ tablet Take 1 tablet (20 mEq total) by mouth daily. 06/25/24  Yes Desiderio Chew, PA-C    Past Medical History: Past Medical History:  Diagnosis Date   AICD (automatic cardioverter/defibrillator) present 10/10/2016   a. 09/2016 s/p MDT Visia AF MRI single lead ICD (ser # [id number]).   Arthritis    maybe in his spine (09/05/2016)   Asthma    pt. denies   Cardiac arrest (HCC)    a. 08/2016 VT/VF Arrest-->09/2016 s/p MDT Visia AF MRI single lead ICD (ser # [id number]).   Chronic HFrEF (heart failure with reduced ejection fraction) (HCC)    a. 08/2016 Echo: EF 35-40%; b. 04/2018 Echo: EF 30-35%; c. 04/2020 Echo: EF 30-35%; d. 11/2021 Echo: EF 40-45%, GrI DD, nl RV fxn, sev dil LA, mild-mod MR, mild-mod AI, Ao root 38mm.   Coronary artery disease    a. 08/2016 VT Arrest/Cath: LM nl, LAD nl, LCX nl, RGCA 90p, 100d w/ L->R collats, EF 35-45%.   Gout    Hyperlipidemia LDL goal <70    Hypertension    Ischemic cardiomyopathy    a. 08/2016 Echo: EF 35-40%; b. 04/2018 Echo: EF 30-35%; c. 04/2020 Echo: EF 30-35%; d. 11/2021 Echo: EF 40-45%.   LBBB (left bundle branch block)    OSA on CPAP    does not wear CPAP   PAF (paroxysmal atrial fibrillation) (HCC)    a. CHA2DS2VASc = 5-->Eliquis .   Type II diabetes mellitus (HCC)     Past Surgical History: Past Surgical History:  Procedure Laterality Date   CARDIAC CATHETERIZATION N/A 09/07/2016   Procedure: Left Heart Cath and Coronary Angiography;  Surgeon: Peter M Swaziland, MD;  Location: [facility] INVASIVE CV LAB;  Service: Cardiovascular;  Laterality: N/A;   CENTRAL LINE INSERTION  05/06/2024   Procedure: CENTRAL LINE INSERTION;  Surgeon: Cherrie Toribio SAUNDERS, MD;  Location: [facility] INVASIVE CV LAB;  Service: Cardiovascular;;   COLONOSCOPY WITH PROPOFOL  N/A 08/30/2018   Procedure: COLONOSCOPY WITH PROPOFOL ;  Surgeon: Rollin Dover, MD;  Location: [facility] ENDOSCOPY;  Service:  Endoscopy;  Laterality: N/A;   COLONOSCOPY WITH PROPOFOL  N/A 11/16/2023   Procedure: COLONOSCOPY WITH PROPOFOL ;  Surgeon: Rollin Dover, MD;  Location: [facility] ENDOSCOPY;  Service: Gastroenterology;  Laterality: N/A;   COLONOSCOPY WITH PROPOFOL  N/A 11/18/2023   Procedure: COLONOSCOPY WITH PROPOFOL ;  Surgeon: Charlanne Groom, MD;  Location: [facility] ENDOSCOPY;  Service: Gastroenterology;  Laterality: N/A;   EP IMPLANTABLE DEVICE N/A 10/10/2016   Procedure: ICD Implant;  Surgeon: Will Gladis Norton, MD;  Location: [facility] INVASIVE CV LAB;  Service: Cardiovascular;  Laterality: N/A;   HEMOSTASIS CLIP PLACEMENT  11/16/2023   Procedure: HEMOSTASIS CLIP PLACEMENT;  Surgeon: Rollin Dover, MD;  Location: [facility];  Service: Gastroenterology;;   HEMOSTASIS CLIP PLACEMENT  11/18/2023   Procedure: HEMOSTASIS CLIP PLACEMENT;  Surgeon: Charlanne Groom, MD;  Location: [facility];  Service: Gastroenterology;;   HEMOSTASIS CONTROL  11/18/2023   Procedure: HEMOSTASIS CONTROL;  Surgeon: Charlanne Groom, MD;  Location: [facility];  Service: Gastroenterology;;  PuraStat   LUMBAR LAMINECTOMY/DECOMPRESSION MICRODISCECTOMY N/A 01/18/2018   Procedure: L2-3, L3-4, L-4-5 CENTRAL DECOMPRESSION;  Surgeon: Barbarann Oneil BROCKS, MD;  Location: [facility] OR;  Service: Orthopedics;  Laterality: N/A;   LUMBAR LAMINECTOMY/DECOMPRESSION MICRODISCECTOMY N/A 08/12/2020   Procedure: THORACIC ELEVEN LAMINECTOMY;  Surgeon: Louis Shove, MD;  Location: [facility] OR;  Service: Neurosurgery;  Laterality: N/A;   POLYPECTOMY  08/30/2018   Procedure: POLYPECTOMY;  Surgeon: Rollin Dover, MD;  Location: [facility];  Service: Endoscopy;;   POLYPECTOMY  11/16/2023   Procedure: POLYPECTOMY;  Surgeon: Rollin Dover, MD;  Location: [facility];  Service: Gastroenterology;;   RIGHT HEART CATH N/A 05/06/2024   Procedure: RIGHT HEART CATH;  Surgeon: Cherrie Toribio SAUNDERS, MD;  Location: [facility] INVASIVE CV LAB;  Service: Cardiovascular;  Laterality: N/A;   TRANSESOPHAGEAL ECHOCARDIOGRAM (CATH  LAB) N/A 06/09/2024   Procedure: TRANSESOPHAGEAL ECHOCARDIOGRAM;  Surgeon: [name] Morene PARAS, MD;  Location: [facility] INVASIVE CV LAB;  Service: Cardiovascular;  Laterality: N/A;    Family History: Family History  Problem Relation Age of Onset   Heart attack Father        died in his 54's   Hypertension Sister    Cancer Brother        uncertain, leg   Hypertension Sister     Social History: Social History   Socioeconomic History   Marital status: Widowed    Spouse name: Not on file   Number of children: Not on file   Years of education: Not on file   Highest education level: Not on file  Occupational History   Occupation: semi-retired Proofreader  Tobacco Use   Smoking status: Never   Smokeless tobacco: Never  Vaping Use   Vaping status: Never Used  Substance and Sexual Activity   Alcohol use: No   Drug use: No   Sexual activity: Not on file  Other Topics Concern   Not on file  Social History Narrative   Lives with daughters.    Social Drivers of Corporate investment banker Strain: Not on file  Food Insecurity: No Food Insecurity (05/04/2024)   Hunger Vital Sign    Worried About Running Out of Food in the Last Year: Never true    Ran Out of Food in the Last Year: Never true  Transportation Needs: No Transportation Needs (05/04/2024)   PRAPARE - Administrator, Civil Service (Medical): No    Lack of Transportation (Non-Medical): No  Physical Activity: Not on file  Stress: Not on file  Social Connections: Moderately Integrated (05/04/2024)   Social Connection and Isolation Panel    Frequency of Communication with Friends and Family: More than three times a week    Frequency of Social Gatherings with Friends and Family: More than three times a week    Attends Religious Services: More than 4 times per year    Active Member of [employer] or Organizations: Yes    Attends Banker Meetings: 1 to 4 times per year    Marital Status: Widowed  Recent Concern:  Social Connections - Moderately Isolated (04/20/2024)   Social Connection and Isolation Panel    Frequency of Communication with Friends and Family: More than three times a week    Frequency of Social Gatherings with Friends and Family: More than three times a week    Attends Religious Services: More than 4 times per year    Active Member of [employer] or Organizations: No    Attends Club or  Organization Meetings: Never    Marital Status: Widowed    Allergies:  Allergies  Allergen Reactions   Contrast Media [Iodinated Contrast Media] Shortness Of Breath and Nausea And Vomiting   Lisinopril  Cough    Objective:    Vital Signs:   Temp:  [97.4 F (36.3 C)-98 F (36.7 C)] 97.7 F (36.5 C) (10/16 0908) Pulse Rate:  [67-88] 67 (10/16 0908) Resp:  [10-22] 10 (10/16 0908) BP: (113-165)/(43-76) 138/61 (10/16 0908) SpO2:  [97 %-100 %] 98 % (10/16 0908)    Weight change: There were no vitals filed for this visit.  Intake/Output:  No intake or output data in the 24 hours ending 07/10/24 0933    Physical Exam    General:  chronically ill appearing, obese, No resp difficulty HEENT: normal Cardiac: JVD elevated, Regular rate & rhythm. 2/6 MR murmur, 2+ b/l LEE  Lungs: diminished at the bases Abdomen: obese, soft, NT  Extremities: no cyanosis, clubbing, rash, 2+ b./l LEE  Neuro: alert & orientedx3, cranial nerves grossly intact. moves all 4 extremities w/o difficulty. Affect pleasant   Telemetry   NSR w/ paroxysmal runs of AT vs AFL into the low 100s   EKG    NSR 90s  Labs   Basic Metabolic Panel: Recent Labs  Lab 07/10/24 0227 07/10/24 0236  NA 134* 138  138  K 4.3 4.1  4.3  CL 104 105  CO2 19*  --   GLUCOSE 103* 97  BUN 50* 51*  CREATININE 2.49* 2.50*  CALCIUM  9.9  --     Liver Function Tests: Recent Labs  Lab 07/10/24 0227  AST 31  ALT 35  ALKPHOS 89  BILITOT 3.3*  PROT 7.1  ALBUMIN  3.5   No results for input(s): LIPASE, AMYLASE in the last 168  hours. No results for input(s): AMMONIA in the last 168 hours.  CBC: Recent Labs  Lab 07/10/24 0227 07/10/24 0236  WBC 8.6  --   NEUTROABS 6.8  --   HGB 11.8* 13.6  13.9  HCT 37.2* 40.0  41.0  MCV 94.4  --   PLT 149*  --     Cardiac Enzymes: No results for input(s): CKTOTAL, CKMB, CKMBINDEX, TROPONINI in the last 168 hours.  BNP: BNP (last 3 results) Recent Labs    05/16/24 1308 06/25/24 1604 07/10/24 0227  BNP 618.2* 1,585.3* 1,952.1*    ProBNP (last 3 results) No results for input(s): PROBNP in the last 8760 hours.   CBG: No results for input(s): GLUCAP in the last 168 hours.  Coagulation Studies: No results for input(s): LABPROT, INR in the last 72 hours.   Imaging   CT Chest Wo Contrast Result Date: 07/10/2024 CLINICAL DATA:  Cough with respiratory illness. Right upper lobe opacity seen on chest x-ray earlier today. EXAM: CT CHEST WITHOUT CONTRAST TECHNIQUE: Multidetector CT imaging of the chest was performed following the standard protocol without IV contrast. RADIATION DOSE REDUCTION: This exam was performed according to the departmental dose-optimization program which includes automated exposure control, adjustment of the mA and/or kV according to patient size and/or use of iterative reconstruction technique. COMPARISON:  Chest x-ray earlier today.  Chest CT 09/08/2019 FINDINGS: Cardiovascular: The heart is enlarged. Coronary artery calcification is evident. Moderate atherosclerotic calcification is noted in the wall of the thoracic aorta. Left-sided pacer/AICD evident. Mediastinum/Nodes: Mild pre-vascular lymphadenopathy evident with 11 mm short axis lymph node seen on 42/2. 12 mm short axis precarinal node on 52/2. No evidence for gross hilar lymphadenopathy although assessment  is limited by the lack of intravenous contrast on the current study. The esophagus has normal imaging features. Borderline lymphadenopathy in the right axilla.  Lungs/Pleura: Biapical pleuroparenchymal scarring evident. Mosaic attenuation in the lungs bilaterally is nonspecific but may reflect air trapping from small airways disease. 7 mm posterior right pulmonary nodule on 59/4 is new since prior chest CT. 12 mm right lower lobe ground-glass opacity identified on 75/4. Subsegmental atelectasis noted in the left upper and lower lobes. Small bilateral pleural effusions. Upper Abdomen: Liver is heterogeneous with irregular contour suggesting cirrhosis. Comparing to abdomen CT of 05/04/2024, new subtle hypodense lesions are seen in the hepatic dome measuring 4.5 cm on image 116/2 and in the lateral segment left liver measuring 3.9 cm on image 119/2. There is probably a third 2.8 cm lesion in the caudate lobe on image 128/2. Calcified gallstones evident. New ascites is seen around the liver and the spleen. Musculoskeletal: No worrisome lytic or sclerotic osseous abnormality. IMPRESSION: 1. Heterogeneous liver with irregular contour suggesting cirrhosis. New subtle hypodense lesions in the hepatic dome and lateral segment left liver. There is probably a third lesion in the caudate lobe. Imaging features are concerning for metastatic disease. Dedicated CT abdomen and pelvis recommended with intravenous contrast material if renal function permits. 2. 7 mm posterior right pulmonary nodule is new since prior chest CT. 12 mm right lower lobe ground-glass opacity. Initial follow-up with CT at 6 months is recommended to confirm persistence. If persistent, repeat CT is recommended every 2 years until 5 years of stability has been established. This recommendation follows the consensus statement: Guidelines for Management of Incidental Pulmonary Nodules Detected on CT Images: From the Fleischner Society 2017; Radiology 2017; 284:228-243. 3. Mild mediastinal lymphadenopathy. 4. Mosaic attenuation in the lungs bilaterally is nonspecific but may reflect air trapping from small airways  disease. 5. Small bilateral pleural effusions. 6. New ascites around the liver and spleen. 7. Cholelithiasis. 8.  Aortic Atherosclerosis (ICD10-I70.0). Electronically Signed   By: Camellia Candle M.D.   On: 07/10/2024 07:12   DG Chest Port 1 View Result Date: 07/10/2024 EXAM: 1 VIEW(S) XRAY OF THE CHEST 07/10/2024 02:10:00 AM COMPARISON: 06/25/2024 CLINICAL HISTORY: DOE. Lethargic, leg swelling. FINDINGS: LINES, TUBES AND DEVICES: Left subclavian ICD. LUNGS AND PLEURA: Mild right basilar opacity, likely atelectasis. Faint patchy / nodular opacities in the right upper lobe, persistent, although possibly osseous. No pulmonary edema. No pleural effusion. No pneumothorax. HEART AND MEDIASTINUM: Cardiomegaly. Thoracic aortic atherosclerosis. BONES AND SOFT TISSUES: No acute osseous abnormality. IMPRESSION: 1. Cardiomegaly. No frank interstitial edema. 2. Faint patchy/nodular opacities in the right upper lobe, equivocal. Consider chest CT for further characterization if clinically warranted. 3. Mild right basilar opacity, likely atelectasis. Electronically signed by: Pinkie Pebbles MD 07/10/2024 02:18 AM EDT RP Workstation: [id number]     Medications:     Current Medications:   Infusions:     Patient Profile   73 y/o male w/ chronic systolic heart failure d/t ICM, CAD, MR, PAF, H/o VT/VF s/p ICD and CKD IIIb admitted w/ a/c CHF w/ volume overload and AKI.   Assessment/Plan   Chronic systolic heart failure: Primarily ischemic, prior history of infarcted RCA.   - Most recent 2D echo 8/25 EF 35-40%, RV mildly reduced  - NYHA IIIb w/ volume overload - Diurese w/ IV Lasix  80 mg bid and place UNNA boots - LA ok at 1.7 but will closely monitor for s/o low output w/ serial lactic acid levels, close monitoring of renal indices  and UOP. He required milrinone  previous admission, low threshold to add if difficulties diuresing  - CKD limits MRA and RAASi - given poor tolerance to Imdur /Hydralazine , will  hold off on restarting for now  - No SGLT-2 with recent UTI - no ? blocker w/ concern for low output  - ICD in place. If continues w/ persistently low EF may benefit for CRT upgrade, QRS borderline at 135 ms     Mitral regurgitation: 2/2 ischemic disease.  TEE 9/25 with more moderate MR by PISA, valve area, systolic blunting of pulmonary veins. 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 3rd spacing on exam w/ significant LEE - place unna boots and follow SCr w/ diuresis  - no SGLT2i given h/o UTIs   PAF - Previously diagnosed on device - observed runs of possible AT vs AFL while in the ED - Continue amio 200 mg daily  - Continue Eliquis  5 mg twice a day     Hx VT/VF - Has single chamber ICD - Continue amiodarone  200 mg daily   CAD: Single vessel RCA disease - No anginal pain  Abnormal Chest CT  - posterior right pulmonary nodule is new since prior study + s/o cirrhosis + hepatic lesions concerning for ? Metastatic dz, though liver cirrhosis may be 2/2 HF - differ further w/u to IM/PCP     Length of Stay: 0  Caffie Shed, PA-C  07/10/2024, 9:33 AM    Advanced Heart Failure Team Pager [phone] (M-F; 7a - 5p)  Please contact [facility] for night-coverage after hours (4p -7a ) and weekends on [email or web address]

## 2024-07-10 NOTE — ED Triage Notes (Signed)
 PT complaints of weakness and hardly walking, report swelling on legs.   Hx of heart failure

## 2024-07-10 NOTE — ED Notes (Signed)
 Patient states he does not feel as if he can ambulate at this time.

## 2024-07-10 NOTE — H&P (Addendum)
 History and Physical    Patient: Dean Petillo Sr. FMW:[id number] DOB: 1951-05-14 DOA: 07/10/2024 DOS: the patient was seen and examined on 07/10/2024 PCP: [name], MD  Patient coming from: Home  Chief Complaint:  weaknes and swelling   HPI: Dean Delellis Sr. is a 73 y.o. male with medical history significant of HFrEF, VT s/p AICD, PAF, hyperlipidemia, CKD IV and T2DM presents with weakness and fluid retention after taking isosorbide  and hydralazine .  Review of records note patient had just recently been taken for right heart cath by Dr. [name] on 06/09/2024 which noted moderate mitral regurgitation via PIS a method with systolic blunting of the pulmonary phalanx, no reversal, short posterior leaflet, poor coaptation, moderate the reduced LVEF, and  severe lead related tricuspid regurgitation.  He experiences significant weakness and an inability to walk or move after taking isosorbide  and hydralazine .  Review of records note hydralazine  has been increased to 75 mg 3 times daily and Imdur  60 mg daily.  He describes feeling 'so weak' that he could not get up and walk. He has visited the hospital multiple times due to these symptoms and recalls being told by another doctor maybe the medication was too strong.  He had been cutting back the medicines by half over the last week and despite this still  was not doing well.  He has noticed a significant weight gain of approximately 15-20 pounds over the last three months, with his weight increasing from his usual 186 pounds to 206 pounds. He attributes this weight gain to fluid retention.  He also reports swelling in his stomach, which has become 'a little puffy'.  No nausea or vomiting. However, he reports feeling short of breath, which is more pronounced than usual.  He has been unable to walk a few feet without coming extremely out of breath.  He previously used a CPAP machine for a year but was told he no longer needed it after a repeat sleep  study.  In the ED patient was noted to be afebrile with tachypnea, blood pressures 113/43 to 165/76, and O2 saturations maintained on room air.  Labs significant for BNP 1952.1, lactic acid 1.7, hemoglobin 11.8, platelets 149, sodium 134, BUN 50, creatinine 2.49, and total bilirubin 3.3.  Chest x-ray noted cardiomegaly and no frank interstitial edema.  CT of the chest noted heterogeneous liver with irregular contour suggesting cirrhosis with subtle hypodense lesions in the dome and lateral segments, 7 mm posterior right pulmonary nodule, 12 mm right lower lobe nodule, small pleural effusions bilaterally, new ascites around liver and spleen, and cholelithiasis.  Patient had been given Lasix  60 mg IV and Zofran .  Review of Systems: As mentioned in the history of present illness. All other systems reviewed and are negative. 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 09/2016 s/p MDT Visia AF MRI single lead ICD (ser # [id number]).   Arthritis    maybe in his spine (09/05/2016)   Asthma    pt. denies   Cardiac arrest (HCC)    a. 08/2016 VT/VF Arrest-->09/2016 s/p MDT Visia AF MRI single lead ICD (ser # [id number]).   Chronic HFrEF (heart failure with reduced ejection fraction) (HCC)    a. 08/2016 Echo: EF 35-40%; b. 04/2018 Echo: EF 30-35%; c. 04/2020 Echo: EF 30-35%; d. 11/2021 Echo: EF 40-45%, GrI DD, nl RV fxn, sev dil LA, mild-mod MR, mild-mod AI, Ao root 38mm.   Coronary artery disease    a.  08/2016 VT Arrest/Cath: LM nl, LAD nl, LCX nl, RGCA 90p, 100d w/ L->R collats, EF 35-45%.   Gout    Hyperlipidemia LDL goal <70    Hypertension    Ischemic cardiomyopathy    a. 08/2016 Echo: EF 35-40%; b. 04/2018 Echo: EF 30-35%; c. 04/2020 Echo: EF 30-35%; d. 11/2021 Echo: EF 40-45%.   LBBB (left bundle branch block)    OSA on CPAP    does not wear CPAP   PAF (paroxysmal atrial fibrillation) (HCC)    a. CHA2DS2VASc = 5-->Eliquis .   Type II  diabetes mellitus (HCC)    Past Surgical History:  Procedure Laterality Date   CARDIAC CATHETERIZATION N/A 09/07/2016   Procedure: Left Heart Cath and Coronary Angiography;  Surgeon: Peter M Swaziland, MD;  Location: [facility] INVASIVE CV LAB;  Service: Cardiovascular;  Laterality: N/A;   CENTRAL LINE INSERTION  05/06/2024   Procedure: CENTRAL LINE INSERTION;  Surgeon: Cherrie Toribio SAUNDERS, MD;  Location: [facility] INVASIVE CV LAB;  Service: Cardiovascular;;   COLONOSCOPY WITH PROPOFOL  N/A 08/30/2018   Procedure: COLONOSCOPY WITH PROPOFOL ;  Surgeon: Rollin Dover, MD;  Location: [facility] ENDOSCOPY;  Service: Endoscopy;  Laterality: N/A;   COLONOSCOPY WITH PROPOFOL  N/A 11/16/2023   Procedure: COLONOSCOPY WITH PROPOFOL ;  Surgeon: Rollin Dover, MD;  Location: [facility]OSCOPY;  Service: Gastroenterology;  Laterality: N/A;   COLONOSCOPY WITH PROPOFOL  N/A 11/18/2023   Procedure: COLONOSCOPY WITH PROPOFOL ;  Surgeon: Charlanne Groom, MD;  Location: [facility]COPY;  Service: Gastroenterology;  Laterality: N/A;   EP IMPLANTABLE DEVICE N/A 10/10/2016   Procedure: ICD Implant;  Surgeon: Will Gladis Norton, MD;  Location: [facility] INVASIVE CV LAB;  Service: Cardiovascular;  Laterality: N/A;   HEMOSTASIS CLIP PLACEMENT  11/16/2023   Procedure: HEMOSTASIS CLIP PLACEMENT;  Surgeon: Rollin Dover, MD;  Location: [facility];  Service: Gastroenterology;;   HEMOSTASIS CLIP PLACEMENT  11/18/2023   Procedure: HEMOSTASIS CLIP PLACEMENT;  Surgeon: Charlanne Groom, MD;  Location: [facility];  Service: Gastroenterology;;   HEMOSTASIS CONTROL  11/18/2023   Procedure: HEMOSTASIS CONTROL;  Surgeon: Charlanne Groom, MD;  Location: [facility];  Service: Gastroenterology;;  PuraStat   LUMBAR LAMINECTOMY/DECOMPRESSION MICRODISCECTOMY N/A 01/18/2018   Procedure: L2-3, L3-4, L-4-5 CENTRAL DECOMPRESSION;  Surgeon: Barbarann Oneil BROCKS, MD;  Location: [facility] OR;  Service: Orthopedics;  Laterality: N/A;   LUMBAR LAMINECTOMY/DECOMPRESSION MICRODISCECTOMY N/A 08/12/2020   Procedure:  THORACIC ELEVEN LAMINECTOMY;  Surgeon: Louis Shove, MD;  Location: [facility] OR;  Service: Neurosurgery;  Laterality: N/A;   POLYPECTOMY  08/30/2018   Procedure: POLYPECTOMY;  Surgeon: Rollin Dover, MD;  Location: [facility];  Service: Endoscopy;;   POLYPECTOMY  11/16/2023   Procedure: POLYPECTOMY;  Surgeon: Rollin Dover, MD;  Location: [facility];  Service: Gastroenterology;;   RIGHT HEART CATH N/A 05/06/2024   Procedure: RIGHT HEART CATH;  Surgeon: Cherrie Toribio SAUNDERS, MD;  Location: [facility] INVASIVE CV LAB;  Service: Cardiovascular;  Laterality: N/A;   TRANSESOPHAGEAL ECHOCARDIOGRAM (CATH LAB) N/A 06/09/2024   Procedure: TRANSESOPHAGEAL ECHOCARDIOGRAM;  Surgeon: [name] Morene PARAS, MD;  Location: [facility] INVASIVE CV LAB;  Service: Cardiovascular;  Laterality: N/A;   Social History:  reports that he has never smoked. He has never used smokeless tobacco. He reports that he does not drink alcohol and does not use drugs.  Allergies  Allergen Reactions   Contrast Media [Iodinated Contrast Media] Shortness Of Breath and Nausea And Vomiting   Lisinopril  Cough    Family History  Problem Relation Age of Onset   Heart attack Father  died in his 85's   Hypertension Sister    Cancer Brother        uncertain, leg   Hypertension Sister     Prior to Admission medications   Medication Sig Start Date End Date Taking? Authorizing Provider  allopurinol  (ZYLOPRIM ) 300 MG tablet Take 150 mg by mouth daily. 06/25/24  Yes [provider]  amiodarone  (PACERONE ) 200 MG tablet Take 1 tablet (200 mg total) by mouth daily. 09/06/22  Yes Leverne Charlies Helling, PA-C  apixaban  (ELIQUIS ) 5 MG TABS tablet TAKE 1 TABLET BY MOUTH TWICE A DAY 05/01/24  Yes Camnitz, Will Gladis, MD  atorvastatin  (LIPITOR) 40 MG tablet Take 40 mg by mouth daily.   Yes [provider]  cholecalciferol  (VITAMIN D3) 25 MCG (1000 UNIT) tablet Take 1,000 Units by mouth daily. 06/13/23  Yes [provider]  ferrous sulfate  325  (65 FE) MG tablet Take 325 mg by mouth daily. 11/08/23  Yes [provider]  furosemide  (LASIX ) 40 MG tablet Take 1 tablet (40 mg total) by mouth daily. 05/10/24  Yes Fairy Frames, MD  hydrALAZINE  (APRESOLINE ) 50 MG tablet Take 1.5 tablets (75 mg total) by mouth every 8 (eight) hours. Patient taking differently: Take 25 mg by mouth in the morning and at bedtime. 06/09/24  Yes [name] Morene PARAS, MD  isosorbide  mononitrate (IMDUR ) 60 MG 24 hr tablet Take 1 tablet (60 mg total) by mouth daily. Patient taking differently: Take 30 mg by mouth in the morning and at bedtime. 06/18/24  Yes [name] Morene PARAS, MD  polyethylene glycol powder (GLYCOLAX /MIRALAX ) 17 GM/SCOOP powder Take 17 g by mouth daily. Patient taking differently: Take 17 g by mouth daily as needed for mild constipation. 06/25/24  Yes Desiderio Chew, PA-C  potassium chloride  SA (KLOR-CON  M) 20 MEQ tablet Take 1 tablet (20 mEq total) by mouth daily. 06/25/24  Yes Desiderio Chew, PA-C    Physical Exam: Vitals:   07/10/24 1200 07/10/24 1300 07/10/24 1319 07/10/24 1322  BP: (!) 144/61 135/60    Pulse: 66 63 68   Resp: 15 (!) 9 15   Temp:    (!) 97.5 F (36.4 C)  TempSrc:    Oral  SpO2: 100% 100% 100%    Constitutional: Elderly male currently in no acute distress Eyes: PERRL, lids and conjunctivae normal ENMT: Mucous membranes are moist. Posterior pharynx clear of any exudate or lesions.Normal dentition.  Neck: normal, supple, JVD present Respiratory: clear to auscultation bilaterally, no wheezing, no crackles. Normal respiratory effort. No accessory muscle use.  Cardiovascular: Regular rate and rhythm, positive systolic murmur present.  2-3+ pitting bilateral lower extremity edema Abdomen: Protuberant abdomen bowel sounds positive.  No significant tenderness to palpation. Musculoskeletal: no clubbing / cyanosis. No joint deformity upper and lower extremities. Good ROM, no contractures. Normal muscle tone.  Skin: no rashes,  lesions, ulcers. No induration.  Legs currently wrapped Neurologic: CN 2-12 grossly intact. Sensation intact, DTR normal. Strength 5/5 in all 4.  Psychiatric: Normal judgment and insight. Alert and oriented x 3. Normal mood.   Data Reviewed:  EKG reveals sinus rhythm at 90 bpm.  Reviewed labs, imaging, and pertinent records as documented.  Assessment and Plan:  Heart failure with reduced EF Moderate mitral regurgitation Acute on chronic.  Patient presents with complaints of progressively worsening lower extremity swelling with reports of weight gain of approximately 20 pounds over the last couple weeks.  BNP elevated at 1952.1.  Chest x-ray noted cardiomegaly with without frank edema present. Last  echocardiogram noted EF 35 to 40% with grade 2 diastolic dysfunction.  He just underwent right heart cath which noted for mitral regurgitation.  Patient had been given Lasix  60 mg x 1 dose. - Admit to a cardiac telemetry bed - Heart failure order set utilized - Strict I&Os and daily weights - Elevate lower extremities  - Not a candidate for beta-blocker or ACE/ARB  - Continue Lasix  80 mg IV twice daily as tolerated per cardiology recommendations - Appreciate cardiology consultative services,  will follow-up for any further recommendations  Acute kidney injury superimposed on chronic kidney disease stage IIIb Patient presents with creatinine elevated up to 2.49 with BUN 50.  Baseline creatinine previously noted to be around 2.16 when checked on 10/1.  Suspect secondary to hypoperfusion in the setting of congestive heart failure. Avoid possible nephrotoxic agents-  - Continue to monitor kidney function with diuresis  Essential hypertension Blood pressures currently maintained. - Held isosorbide  mononitrate and hydralazine    Paroxysmal atrial fibrillation on chronic anticoagulation Patient was noted to be in sinus rhythm at this time. - Continue amiodarone  and Eliquis   History of VT/VF S/p  ICD - Continue amiodarone   CAD  Hyperlipidemia - Continue atorvastatin   Controlled diabetes mellitus type 2, without long-term use of insulin  Last available hemoglobin A1c noted to be 5.4 when checked on 05/04/2024 - Continue    Anemia of chronic kidney disease Hemoglobin 11.8 which appears around patient's baseline.  No reports of bleeding. - Continue to monitor  Thrombocytopenia Acute.  Platelet count noted to be 149.  Thought likely secondary to liver dysfunction related with heart failure. - Continue to monitor  Abnormal CT of chest CT of the chest noted heterogeneous liver with irregular contour suggesting cirrhosis with subtle hypodense lesions in the dome and lateral segments, 7 mm posterior right pulmonary nodule, 12 mm right lower lobe nodule, small pleural effusions bilaterally, new ascites around liver and spleen, and cholelithiasis. - Consider need of further workup in the outpatient setting or once patient's acute issues are improved  DVT prophylaxis: Eliquis  Advance Care Planning:   Code Status: Full Code    Consults: Cardiology  Family Communication: Daughter updated over the phone  Severity of Illness: The appropriate patient status for this patient is INPATIENT. Inpatient status is judged to be reasonable and necessary in order to provide the required intensity of service to ensure the patient's safety. The patient's presenting symptoms, physical exam findings, and initial radiographic and laboratory data in the context of their chronic comorbidities is felt to place them at high risk for further clinical deterioration. Furthermore, it is not anticipated that the patient will be medically stable for discharge from the hospital within 2 midnights of admission.   * I certify that at the point of admission it is my clinical judgment that the patient will require inpatient hospital care spanning beyond 2 midnights from the point of admission due to high intensity of  service, high risk for further deterioration and high frequency of surveillance required.*  Author: Maximino DELENA Sharps, MD 07/10/2024 1:42 PM  For on call review [email or web address].

## 2024-07-10 NOTE — ED Notes (Signed)
 Patient ambulated to bathroom with pulse ox. Walked with cane at baseline, did not require extra assistance and did not stop to catch breath en route to bathroom. O2 remained 92-94% on room air.

## 2024-07-10 NOTE — ED Provider Notes (Signed)
 [place] EMERGENCY DEPARTMENT AT [facility] Provider Note  CSN: [id number] Arrival date & time: 07/10/24 0125  Chief Complaint(s) No chief complaint on file.  HPI Dean Baldree Sr. is a 73 y.o. male with a past medical history listed below including chronic systolic heart failure with last EF of 35 to 40% status post ICD, moderate MR here for generalized fatigue and bilateral leg swelling.  Patient reports recent increase in his Lasix  from 40 mg daily to 80 mg daily.  Patient also reports that he was previously put on Imdur  and hydralazine  which he attributes to feeling bad since starting to take them.  He denies any chest pain.  Denies any dyspnea on exertion.  Does endorse intermittent orthopnea.  Reports that he is compliant with his medications, but does not like taking the hydralazine  or Imdur  due to the side effects.  Reports that he has reached out to his cardiologist regarding this but has not heard back.  On review of records, it appears that [facility] the nurse practitioner replied to his telephone call recommended he drop his Imdur  to 30 mg daily instead of 60  The history is provided by the patient.    Past Medical History Past Medical History:  Diagnosis Date   AICD (automatic cardioverter/defibrillator) present 10/10/2016   a. 09/2016 s/p MDT Visia AF MRI single lead ICD (ser # [id number]).   Arthritis    maybe in his spine (09/05/2016)   Asthma    pt. denies   Cardiac arrest (HCC)    a. 08/2016 VT/VF Arrest-->09/2016 s/p MDT Visia AF MRI single lead ICD (ser # [id number]).   Chronic HFrEF (heart failure with reduced ejection fraction) (HCC)    a. 08/2016 Echo: EF 35-40%; b. 04/2018 Echo: EF 30-35%; c. 04/2020 Echo: EF 30-35%; d. 11/2021 Echo: EF 40-45%, GrI DD, nl RV fxn, sev dil LA, mild-mod MR, mild-mod AI, Ao root 38mm.   Coronary artery disease    a. 08/2016 VT Arrest/Cath: LM nl, LAD nl, LCX nl, RGCA 90p, 100d w/ L->R collats, EF 35-45%.   Gout     Hyperlipidemia LDL goal <70    Hypertension    Ischemic cardiomyopathy    a. 08/2016 Echo: EF 35-40%; b. 04/2018 Echo: EF 30-35%; c. 04/2020 Echo: EF 30-35%; d. 11/2021 Echo: EF 40-45%.   LBBB (left bundle branch block)    OSA on CPAP    does not wear CPAP   PAF (paroxysmal atrial fibrillation) (HCC)    a. CHA2DS2VASc = 5-->Eliquis .   Type II diabetes mellitus [facility])    Patient Active Problem List   Diagnosis Date Noted   Acute on chronic systolic CHF (congestive heart failure) (HCC) 05/04/2024   SOB (shortness of breath) 05/04/2024   Elevated troponin 05/04/2024   High anion gap metabolic acidosis 05/04/2024   Paroxysmal atrial fibrillation (HCC) 05/04/2024   Essential hypertension 05/04/2024   Obesity, class 1 05/04/2024   Acute on chronic heart failure (HCC) 04/20/2024   Acute GI bleeding 11/17/2023   Ventricular tachycardia (HCC) 08/24/2022   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/2020   Atrial fibrillation, chronic (HCC) 08/12/2020   Thoracic spine fracture (HCC) 06/29/2020   Hyperkalemia 10/23/2019   Diarrhea 10/23/2019   CHF (congestive heart failure) (HCC) 06/04/2019   Acute systolic CHF (congestive heart failure) (HCC) 06/04/2019   Severe sepsis (HCC) 05/06/2019   Acute asthma exacerbation 05/06/2019   Acute respiratory distress 05/06/2019   Acute hypoxic respiratory failure (HCC) 04/06/2019  HCAP (healthcare-associated pneumonia) 04/06/2019   Acute kidney failure 02/27/2019   Chronic combined systolic and diastolic CHF (congestive heart failure) (HCC) 02/27/2019   CKD (chronic kidney disease), stage III (HCC) 05/16/2018   Normochromic normocytic anemia 05/15/2018   CAD (coronary artery disease) 12/17/2017   HLD (hyperlipidemia) 12/17/2017   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   Cardiomyopathy, ischemic    Generalized weakness 10/04/2016   Stage 3 chronic kidney disease (HCC)    Chronic kidney disease, stage 3b (HCC)     Disorientated    Anoxic brain injury (HCC) 09/15/2016   Benign essential HTN    OSA (obstructive sleep apnea)    Transaminitis    Chronic respiratory failure with hypoxia (HCC)    Fever    Acute cystitis    NSVT (nonsustained ventricular tachycardia) (HCC)    Uncomplicated asthma    Type 2 diabetes mellitus with obesity    Acute on chronic combined systolic and diastolic CHF (congestive heart failure) (HCC)    Tachypnea    Hypokalemia    Leukocytosis    Hypoxemia    Acute respiratory failure (HCC)    Cardiopulmonary arrest (HCC) 08/27/2016   Type 2 diabetes mellitus with hyperlipidemia (HCC) 03/02/2014   Class 2 obesity due to excess calories with body mass index (BMI) of 35.0 to 35.9 in adult 03/02/2014   Essential hypertension, benign 03/02/2014   Home Medication(s) Prior to Admission medications   Medication Sig Start Date End Date Taking? Authorizing Provider  allopurinol  (ZYLOPRIM ) 300 MG tablet Take 150 mg by mouth daily. 06/25/24  Yes [provider]  amiodarone  (PACERONE ) 200 MG tablet Take 1 tablet (200 mg total) by mouth daily. 09/06/22  Yes Leverne Charlies Helling, PA-C  apixaban  (ELIQUIS ) 5 MG TABS tablet TAKE 1 TABLET BY MOUTH TWICE A DAY 05/01/24  Yes Camnitz, Will Gladis, MD  atorvastatin  (LIPITOR) 40 MG tablet Take 40 mg by mouth daily.   Yes [provider]  cholecalciferol  (VITAMIN D3) 25 MCG (1000 UNIT) tablet Take 1,000 Units by mouth daily. 06/13/23  Yes [provider]  ferrous sulfate  325 (65 FE) MG tablet Take 325 mg by mouth daily. 11/08/23  Yes [provider]  furosemide  (LASIX ) 40 MG tablet Take 1 tablet (40 mg total) by mouth daily. 05/10/24  Yes Fairy Frames, MD  hydrALAZINE  (APRESOLINE ) 50 MG tablet Take 1.5 tablets (75 mg total) by mouth every 8 (eight) hours. Patient taking differently: Take 25 mg by mouth in the morning and at bedtime. 06/09/24  Yes [name] Morene PARAS, MD  isosorbide  mononitrate (IMDUR ) 60 MG 24 hr  tablet Take 1 tablet (60 mg total) by mouth daily. Patient taking differently: Take 30 mg by mouth in the morning and at bedtime. 06/18/24  Yes [name] Morene PARAS, MD  polyethylene glycol powder (GLYCOLAX /MIRALAX ) 17 GM/SCOOP powder Take 17 g by mouth daily. Patient taking differently: Take 17 g by mouth daily as needed for mild constipation. 06/25/24  Yes Desiderio Chew, PA-C  potassium chloride  SA (KLOR-CON  M) 20 MEQ tablet Take 1 tablet (20 mEq total) by mouth daily. 06/25/24  Yes Desiderio Chew, PA-C  Allergies Contrast media [iodinated contrast media] and Lisinopril   Review of Systems Review of Systems As noted in HPI  Physical Exam Vital Signs  I have reviewed the triage vital signs BP (!) 113/46   Pulse 71   Temp 98 F (36.7 C) (Oral)   Resp 20   SpO2 100%   Physical Exam Vitals reviewed.  Constitutional:      General: He is not in acute distress.    Appearance: He is well-developed. He is not diaphoretic.  HENT:     Head: Normocephalic and atraumatic.     Nose: Nose normal.  Eyes:     General: No scleral icterus.       Right eye: No discharge.        Left eye: No discharge.     Conjunctiva/sclera: Conjunctivae normal.     Pupils: Pupils are equal, round, and reactive to light.  Cardiovascular:     Rate and Rhythm: Normal rate and regular rhythm.     Heart sounds: No murmur heard.    No friction rub. No gallop.  Pulmonary:     Effort: Pulmonary effort is normal. No respiratory distress.     Breath sounds: Normal breath sounds. No stridor. No rales.  Abdominal:     General: There is no distension.     Palpations: Abdomen is soft.     Tenderness: There is no abdominal tenderness.  Musculoskeletal:        General: No tenderness.     Cervical back: Normal range of motion and neck supple.     Right lower leg: 3+ Edema present.     Left  lower leg: 3+ Edema present.  Skin:    General: Skin is warm and dry.     Findings: No erythema or rash.  Neurological:     Mental Status: He is alert and oriented to person, place, and time.     ED Results and Treatments Labs (all labs ordered are listed, but only abnormal results are displayed) Labs Reviewed  COMPREHENSIVE METABOLIC PANEL WITH GFR - Abnormal; Notable for the following components:      Result Value   Sodium 134 (*)    CO2 19 (*)    Glucose, Bld 103 (*)    BUN 50 (*)    Creatinine, Ser 2.49 (*)    Total Bilirubin 3.3 (*)    GFR, Estimated 26 (*)    All other components within normal limits  CBC WITH DIFFERENTIAL/PLATELET - Abnormal; Notable for the following components:   RBC 3.94 (*)    Hemoglobin 11.8 (*)    HCT 37.2 (*)    RDW 18.0 (*)    Platelets 149 (*)    All other components within normal limits  BRAIN NATRIURETIC PEPTIDE - Abnormal; Notable for the following components:   B Natriuretic Peptide 1,952.1 (*)    All other components within normal limits  URINALYSIS, W/ REFLEX TO CULTURE (INFECTION SUSPECTED) - Abnormal; Notable for the following components:   Color, Urine AMBER (*)    APPearance HAZY (*)    Protein, ur 100 (*)    Leukocytes,Ua SMALL (*)    Bacteria, UA RARE (*)    All other components within normal limits  I-STAT CHEM 8, ED - Abnormal; Notable for the following components:   BUN 51 (*)    Creatinine, Ser 2.50 (*)    TCO2 20 (*)    All other components within normal limits  I-STAT VENOUS BLOOD GAS, ED - Abnormal;  Notable for the following components:   pCO2, Ven 34.4 (*)    TCO2 21 (*)    Acid-base deficit 4.0 (*)    All other components within normal limits  RESP PANEL BY RT-PCR (RSV, FLU A&B, COVID)  [id number]  URINE CULTURE                                                                                                                         EKG  EKG Interpretation Date/Time:  Thursday July 10 2024 01:37:40  EDT Ventricular Rate:  90 PR Interval:  247 QRS Duration:  135 QT Interval:  422 QTC Calculation: 517 R Axis:   225  Text Interpretation: Sinus rhythm Prolonged PR interval Probable left atrial enlargement Nonspecific intraventricular conduction delay Anterior infarct, old Borderline ST depression, diffuse leads Confirmed by Trine Likes [phone]) on 07/10/2024 4:25:22 AM       Radiology CT Chest Wo Contrast Result Date: 07/10/2024 CLINICAL DATA:  Cough with respiratory illness. Right upper lobe opacity seen on chest x-ray earlier today. EXAM: CT CHEST WITHOUT CONTRAST TECHNIQUE: Multidetector CT imaging of the chest was performed following the standard protocol without IV contrast. RADIATION DOSE REDUCTION: This exam was performed according to the departmental dose-optimization program which includes automated exposure control, adjustment of the mA and/or kV according to patient size and/or use of iterative reconstruction technique. COMPARISON:  Chest x-ray earlier today.  Chest CT 09/08/2019 FINDINGS: Cardiovascular: The heart is enlarged. Coronary artery calcification is evident. Moderate atherosclerotic calcification is noted in the wall of the thoracic aorta. Left-sided pacer/AICD evident. Mediastinum/Nodes: Mild pre-vascular lymphadenopathy evident with 11 mm short axis lymph node seen on 42/2. 12 mm short axis precarinal node on 52/2. No evidence for gross hilar lymphadenopathy although assessment is limited by the lack of intravenous contrast on the current study. The esophagus has normal imaging features. Borderline lymphadenopathy in the right axilla. Lungs/Pleura: Biapical pleuroparenchymal scarring evident. Mosaic attenuation in the lungs bilaterally is nonspecific but may reflect air trapping from small airways disease. 7 mm posterior right pulmonary nodule on 59/4 is new since prior chest CT. 12 mm right lower lobe ground-glass opacity identified on 75/4. Subsegmental atelectasis noted  in the left upper and lower lobes. Small bilateral pleural effusions. Upper Abdomen: Liver is heterogeneous with irregular contour suggesting cirrhosis. Comparing to abdomen CT of 05/04/2024, new subtle hypodense lesions are seen in the hepatic dome measuring 4.5 cm on image 116/2 and in the lateral segment left liver measuring 3.9 cm on image 119/2. There is probably a third 2.8 cm lesion in the caudate lobe on image 128/2. Calcified gallstones evident. New ascites is seen around the liver and the spleen. Musculoskeletal: No worrisome lytic or sclerotic osseous abnormality. IMPRESSION: 1. Heterogeneous liver with irregular contour suggesting cirrhosis. New subtle hypodense lesions in the hepatic dome and lateral segment left liver. There is probably a third lesion in the caudate lobe. Imaging features are concerning for metastatic disease. Dedicated CT abdomen and pelvis  recommended with intravenous contrast material if renal function permits. 2. 7 mm posterior right pulmonary nodule is new since prior chest CT. 12 mm right lower lobe ground-glass opacity. Initial follow-up with CT at 6 months is recommended to confirm persistence. If persistent, repeat CT is recommended every 2 years until 5 years of stability has been established. This recommendation follows the consensus statement: Guidelines for Management of Incidental Pulmonary Nodules Detected on CT Images: From the Fleischner Society 2017; Radiology 2017; 284:228-243. 3. Mild mediastinal lymphadenopathy. 4. Mosaic attenuation in the lungs bilaterally is nonspecific but may reflect air trapping from small airways disease. 5. Small bilateral pleural effusions. 6. New ascites around the liver and spleen. 7. Cholelithiasis. 8.  Aortic Atherosclerosis (ICD10-I70.0). Electronically Signed   By: Camellia Candle M.D.   On: 07/10/2024 07:12   DG Chest Port 1 View Result Date: 07/10/2024 EXAM: 1 VIEW(S) XRAY OF THE CHEST 07/10/2024 02:10:00 AM COMPARISON:  06/25/2024 CLINICAL HISTORY: DOE. Lethargic, leg swelling. FINDINGS: LINES, TUBES AND DEVICES: Left subclavian ICD. LUNGS AND PLEURA: Mild right basilar opacity, likely atelectasis. Faint patchy / nodular opacities in the right upper lobe, persistent, although possibly osseous. No pulmonary edema. No pleural effusion. No pneumothorax. HEART AND MEDIASTINUM: Cardiomegaly. Thoracic aortic atherosclerosis. BONES AND SOFT TISSUES: No acute osseous abnormality. IMPRESSION: 1. Cardiomegaly. No frank interstitial edema. 2. Faint patchy/nodular opacities in the right upper lobe, equivocal. Consider chest CT for further characterization if clinically warranted. 3. Mild right basilar opacity, likely atelectasis. Electronically signed by: Pinkie Pebbles MD 07/10/2024 02:18 AM EDT RP Workstation: [id number]    Medications Ordered in ED Medications  ondansetron  (ZOFRAN ) injection 4 mg (4 mg Intravenous Given 07/10/24 0250)  furosemide  (LASIX ) injection 60 mg (60 mg Intravenous Given 07/10/24 0529)   Procedures Procedures  (including critical care time) Medical Decision Making / ED Course   Medical Decision Making Amount and/or Complexity of Data Reviewed Labs: ordered. Decision-making details documented in ED Course. Radiology: ordered and independent interpretation performed. Decision-making details documented in ED Course. ECG/medicine tests: ordered and independent interpretation performed. Decision-making details documented in ED Course.  Risk Prescription drug management. Decision regarding hospitalization.    Generalized fatigue and malaise which she attributes to current medication regiment.  Differential diagnosis considered.  Workup below.    Patient does have evidence of volume overload on exam.  Will need to rule out acute CHF exacerbation.  CBC without leukocytosis.  Anemia with stable hemoglobin.  CMP without significant electrolyte derangements.  Mild AKI when compared to priors.   Mild hyperglycemia without DKA.  EKG without acute changes.  BNP 19,000. Chest x-ray with cardiomegaly but no overt pulmonary edema.  Faint opacity in the right upper lower lobes. UA improved from prior, not overtly concerning for UTI. Culture sent.  Patient ambulated well and maintain sats above 92 on room air.  No increased work of breathing.  Will dialyze patient in the emergency department and reassess.  While here, will obtain a CT scan to better characterize lung findings on chest x-ray.  Spoke with Programmer, multimedia. Placed on consult list for eval in the ER.  Patient care turned over to oncoming provider. Patient case and results discussed in detail; please see their note for further ED managment.    Final Clinical Impression(s) / ED Diagnoses Final diagnoses:  Peripheral edema  Fatigue due to excessive exertion, initial encounter    This chart was dictated using voice recognition software.  Despite best efforts to proofread,  errors can occur which can  change the documentation meaning.    Trine Raynell Moder, MD 07/10/24 [phone]

## 2024-07-10 NOTE — ED Provider Notes (Signed)
 Patient seen by cardiology.  Hospitalist admission requested.  Hospitalist service is aware of case.   Laurice Maude BROCKS, MD 07/10/24 [phone]

## 2024-07-10 NOTE — Progress Notes (Signed)
 Orthopedic Tech Progress Note Patient Details:  Dean Authement Sr. 1950-12-17 [id number]  Ortho Devices Type of Ortho Device: Nonie boot Ortho Device/Splint Location: BLE Ortho Device/Splint Interventions: Ordered, Application   Post Interventions Patient Tolerated: Well  Dean [name] A Dean [name] 07/10/2024, 12:53 PM

## 2024-07-10 NOTE — ED Notes (Signed)
 Patient transported to CT

## 2024-07-11 ENCOUNTER — Other Ambulatory Visit: Payer: Self-pay

## 2024-07-11 DIAGNOSIS — I480 Paroxysmal atrial fibrillation: Secondary | ICD-10-CM | POA: Diagnosis not present

## 2024-07-11 DIAGNOSIS — I5023 Acute on chronic systolic (congestive) heart failure: Secondary | ICD-10-CM | POA: Diagnosis not present

## 2024-07-11 DIAGNOSIS — Z95810 Presence of automatic (implantable) cardiac defibrillator: Secondary | ICD-10-CM | POA: Diagnosis not present

## 2024-07-11 DIAGNOSIS — R570 Cardiogenic shock: Secondary | ICD-10-CM | POA: Diagnosis not present

## 2024-07-11 DIAGNOSIS — N179 Acute kidney failure, unspecified: Secondary | ICD-10-CM | POA: Diagnosis not present

## 2024-07-11 LAB — BASIC METABOLIC PANEL WITH GFR
Anion gap: 12 (ref 5–15)
Anion gap: 11 (ref 5–15)
BUN: 49 mg/dL — ABNORMAL HIGH (ref 8–23)
BUN: 52 mg/dL — ABNORMAL HIGH (ref 8–23)
CO2: 19 mmol/L — ABNORMAL LOW (ref 22–32)
CO2: 22 mmol/L (ref 22–32)
Calcium: 9.9 mg/dL (ref 8.9–10.3)
Calcium: 9.8 mg/dL (ref 8.9–10.3)
Chloride: 102 mmol/L (ref 98–111)
Chloride: 101 mmol/L (ref 98–111)
Creatinine, Ser: 2.41 mg/dL — ABNORMAL HIGH (ref 0.61–1.24)
Creatinine, Ser: 2.66 mg/dL — ABNORMAL HIGH (ref 0.61–1.24)
GFR, Estimated: 27 mL/min — ABNORMAL LOW (ref >60)
GFR, Estimated: 24 mL/min — ABNORMAL LOW (ref >60)
Glucose, Bld: 89 mg/dL (ref 70–99)
Glucose, Bld: 134 mg/dL — ABNORMAL HIGH (ref 70–99)
Potassium: 4.7 mmol/L (ref 3.5–5.1)
Potassium: 4.3 mmol/L (ref 3.5–5.1)
Sodium: 133 mmol/L — ABNORMAL LOW (ref 135–145)
Sodium: 134 mmol/L — ABNORMAL LOW (ref 135–145)

## 2024-07-11 LAB — MAGNESIUM: Magnesium: 2.2 mg/dL (ref 1.7–2.4)

## 2024-07-11 LAB — URINE CULTURE

## 2024-07-11 LAB — COOXEMETRY PANEL
Carboxyhemoglobin: 1.3 % (ref 0.5–1.5)
Methemoglobin: <0.7 % (ref 0.0–1.5)
O2 Saturation: 55.9 %
Total hemoglobin: 11.2 g/dL — ABNORMAL LOW (ref 12.0–16.0)

## 2024-07-11 LAB — LACTIC ACID, PLASMA: Lactic Acid, Venous: 2.5 mmol/L (ref 0.5–1.9)

## 2024-07-11 MED ORDER — MILRINONE LACTATE IN DEXTROSE 20-5 MG/100ML-% IV SOLN
0.2500 ug/kg/min | INTRAVENOUS | Status: DC
Start: 2024-07-11 — End: 2024-07-17
  Administered 2024-07-11: 0.125 ug/kg/min via INTRAVENOUS
  Administered 2024-07-12 – 2024-07-16 (×8): 0.25 ug/kg/min via INTRAVENOUS
  Filled 2024-07-11 (×9): qty 100

## 2024-07-11 MED ORDER — POLYETHYLENE GLYCOL 3350 17 G PO PACK: 17.0000 g | PACK | Freq: Every day | ORAL | Status: DC | PRN

## 2024-07-11 MED ORDER — DEXTROSE 5 % IV SOLN
160.0000 mg | Freq: Once | INTRAVENOUS | Status: AC
  Administered 2024-07-11: 160 mg via INTRAVENOUS
  Filled 2024-07-11: qty 16

## 2024-07-11 MED ORDER — ENSURE PLUS HIGH PROTEIN PO LIQD
237.0000 mL | Freq: Two times a day (BID) | ORAL | Status: DC
  Administered 2024-07-11 – 2024-07-19 (×16): 237 mL via ORAL

## 2024-07-11 MED ORDER — SODIUM CHLORIDE 0.9% FLUSH
10.0000 mL | Freq: Two times a day (BID) | INTRAVENOUS | Status: DC
  Administered 2024-07-11 – 2024-07-19 (×17): 10 mL

## 2024-07-11 MED ORDER — CHLORHEXIDINE GLUCONATE CLOTH 2 % EX PADS
6.0000 | MEDICATED_PAD | Freq: Every day | CUTANEOUS | Status: DC
  Administered 2024-07-11 – 2024-07-19 (×9): 6 via TOPICAL

## 2024-07-11 MED ORDER — ADULT MULTIVITAMIN W/MINERALS CH
1.0000 | ORAL_TABLET | Freq: Every day | ORAL | Status: DC
  Administered 2024-07-12 – 2024-07-19 (×8): 1 via ORAL
  Filled 2024-07-11 (×9): qty 1

## 2024-07-11 MED ORDER — DEXTROSE 5 % IV SOLN
20.0000 mg/h | INTRAVENOUS | Status: AC
  Administered 2024-07-11 – 2024-07-16 (×10): 20 mg/h via INTRAVENOUS
  Filled 2024-07-11 (×11): qty 20

## 2024-07-11 MED ORDER — MELATONIN 5 MG PO TABS
5.0000 mg | ORAL_TABLET | Freq: Every evening | ORAL | Status: DC | PRN
  Administered 2024-07-12: 5 mg via ORAL
  Filled 2024-07-11: qty 1

## 2024-07-11 MED ORDER — SODIUM CHLORIDE 0.9% FLUSH: 10.0000 mL | INTRAVENOUS | Status: DC | PRN

## 2024-07-11 NOTE — Progress Notes (Signed)
 Progress Note   Patient: Dean Cofer Sr. FMW:[id number] DOB: [birth year] DOA: 07/10/2024  DOS: the patient was seen and examined on 07/11/2024   Brief hospital course:  73 y.o. male with medical history significant of HFrEF, VT s/p AICD, PAF, hyperlipidemia, CKD IV and T2DM presents with weakness and fluid retention, found to have exacerbation of HFrEF.  Assessment and Plan:  Acute exacerbation of HFrEF/moderate mitral regurgitation - Last noted EF 35-40% with grade 2 DD.  History of previous hospitalization requiring inotropic support for diuresis.  Cardiology/heart failure following closely.  Initially on Lasix  80 mg every 12 but with minimal diuresis recorded.  Noted worsening lactate.  Likely pursue PICC line and milrinone .  Per cardiology increase Lasix  to 160 mg IV twice daily.  Continue strict I's and O's.  Pursue RHC early next week.  Acute kidney injury on CKD 3B - Creatinine showing mild improvement from yesterday, down from 2.50 now 2.41.  Likely cardiorenal etiology.  Avoiding nephrotoxic agents.  Monitor urine output, will recheck BMP in AM.  Paroxysmal atrial fibrillation - Continue amiodarone  and Eliquis .  History of VT s/p ICD - Amiodarone  on board.  Telemetry.  Diabetes mellitus - Insulin  sliding scale on board.  Anemia of chronic disease - No active bleeding.  Hemoglobin stable.  Will continue to monitor.  Possible cirrhosis - Noted on CT, heterogenous liver with irregular contour suggesting cirrhosis.  Consider additional workup in the outpatient setting after resolution of above.  Physical debilitation muscle weakness - PT/OT on board.   Subjective: Patient resting comfortably this morning.  States he feels a bit better than presentation.  Not as short of breath or comfortable.  Denies any fever, purulent sputum, chest pain, nausea, vomiting, abdominal pain.  Physical Exam:  Vitals:   07/11/24 0838 07/11/24 1128 07/11/24 1206 07/11/24 1245  BP:   133/88    Pulse: 67 73 84 65  Resp: (!) 22 19    Temp:      TempSrc:      SpO2: 95% 93% 98% 94%  Weight:      Height:        GENERAL:  Alert, pleasant, no acute distress  HEENT:  EOMI CARDIOVASCULAR:  RRR, systolic murmur appreciated RESPIRATORY:  Clear to auscultation, no wheezing, rales, or rhonchi GASTROINTESTINAL:  Soft, nontender, nondistended EXTREMITIES: Bandages over BL LE, 1-2+ BL LE pitting edema NEURO:  No new focal deficits appreciated SKIN:  No rashes noted PSYCH:  Appropriate mood and affect     Data Reviewed:  Imaging Studies: US  EKG SITE RITE Result Date: 07/11/2024 If Site Rite image not attached, placement could not be confirmed due to current cardiac rhythm.  CT Chest Wo Contrast Result Date: 07/10/2024 CLINICAL DATA:  Cough with respiratory illness. Right upper lobe opacity seen on chest x-ray earlier today. EXAM: CT CHEST WITHOUT CONTRAST TECHNIQUE: Multidetector CT imaging of the chest was performed following the standard protocol without IV contrast. RADIATION DOSE REDUCTION: This exam was performed according to the departmental dose-optimization program which includes automated exposure control, adjustment of the mA and/or kV according to patient size and/or use of iterative reconstruction technique. COMPARISON:  Chest x-ray earlier today.  Chest CT 09/08/2019 FINDINGS: Cardiovascular: The heart is enlarged. Coronary artery calcification is evident. Moderate atherosclerotic calcification is noted in the wall of the thoracic aorta. Left-sided pacer/AICD evident. Mediastinum/Nodes: Mild pre-vascular lymphadenopathy evident with 11 mm short axis lymph node seen on 42/2. 12 mm short axis precarinal node on 52/2. No evidence for gross hilar  lymphadenopathy although assessment is limited by the lack of intravenous contrast on the current study. The esophagus has normal imaging features. Borderline lymphadenopathy in the right axilla. Lungs/Pleura: Biapical  pleuroparenchymal scarring evident. Mosaic attenuation in the lungs bilaterally is nonspecific but may reflect air trapping from small airways disease. 7 mm posterior right pulmonary nodule on 59/4 is new since prior chest CT. 12 mm right lower lobe ground-glass opacity identified on 75/4. Subsegmental atelectasis noted in the left upper and lower lobes. Small bilateral pleural effusions. Upper Abdomen: Liver is heterogeneous with irregular contour suggesting cirrhosis. Comparing to abdomen CT of 05/04/2024, new subtle hypodense lesions are seen in the hepatic dome measuring 4.5 cm on image 116/2 and in the lateral segment left liver measuring 3.9 cm on image 119/2. There is probably a third 2.8 cm lesion in the caudate lobe on image 128/2. Calcified gallstones evident. New ascites is seen around the liver and the spleen. Musculoskeletal: No worrisome lytic or sclerotic osseous abnormality. IMPRESSION: 1. Heterogeneous liver with irregular contour suggesting cirrhosis. New subtle hypodense lesions in the hepatic dome and lateral segment left liver. There is probably a third lesion in the caudate lobe. Imaging features are concerning for metastatic disease. Dedicated CT abdomen and pelvis recommended with intravenous contrast material if renal function permits. 2. 7 mm posterior right pulmonary nodule is new since prior chest CT. 12 mm right lower lobe ground-glass opacity. Initial follow-up with CT at 6 months is recommended to confirm persistence. If persistent, repeat CT is recommended every 2 years until 5 years of stability has been established. This recommendation follows the consensus statement: Guidelines for Management of Incidental Pulmonary Nodules Detected on CT Images: From the Fleischner Society 2017; Radiology 2017; 284:228-243. 3. Mild mediastinal lymphadenopathy. 4. Mosaic attenuation in the lungs bilaterally is nonspecific but may reflect air trapping from small airways disease. 5. Small bilateral  pleural effusions. 6. New ascites around the liver and spleen. 7. Cholelithiasis. 8.  Aortic Atherosclerosis (ICD10-I70.0). Electronically Signed   By: Camellia Candle M.D.   On: 07/10/2024 07:12   DG Chest Port 1 View Result Date: 07/10/2024 EXAM: 1 VIEW(S) XRAY OF THE CHEST 07/10/2024 02:10:00 AM COMPARISON: 06/25/2024 CLINICAL HISTORY: DOE. Lethargic, leg swelling. FINDINGS: LINES, TUBES AND DEVICES: Left subclavian ICD. LUNGS AND PLEURA: Mild right basilar opacity, likely atelectasis. Faint patchy / nodular opacities in the right upper lobe, persistent, although possibly osseous. No pulmonary edema. No pleural effusion. No pneumothorax. HEART AND MEDIASTINUM: Cardiomegaly. Thoracic aortic atherosclerosis. BONES AND SOFT TISSUES: No acute osseous abnormality. IMPRESSION: 1. Cardiomegaly. No frank interstitial edema. 2. Faint patchy/nodular opacities in the right upper lobe, equivocal. Consider chest CT for further characterization if clinically warranted. 3. Mild right basilar opacity, likely atelectasis. Electronically signed by: Pinkie Pebbles MD 07/10/2024 02:18 AM EDT RP Workstation: [id number]   DG Chest Portable 1 View Result Date: 06/25/2024 CLINICAL DATA:  Increased shortness of breath and peripheral edema. EXAM: PORTABLE CHEST 1 VIEW COMPARISON:  05/07/2024 FINDINGS: Enlarged cardiac silhouette with an interval increase in size. Stable left subclavian AICD lead. Mild diffuse peribronchial thickening without significant change. Interval minimal patchy density in the right mid to upper lung zone. No acute bony abnormality. IMPRESSION: 1. Interval minimal patchy density in the right mid to upper lung zone, suspicious for pneumonia. 2. Mildly progressive cardiomegaly. 3. Stable mild bronchitic changes. Electronically Signed   By: Elspeth Bathe M.D.   On: 06/25/2024 17:09    There are no new results to review at this time.  Previous records (including but not limited to H&P, progress notes, nursing  notes, TOC management) were reviewed in assessment of this patient.  Labs: CBC: Recent Labs  Lab 07/10/24 0227 07/10/24 0236  WBC 8.6  --   NEUTROABS 6.8  --   HGB 11.8* 13.6  13.9  HCT 37.2* 40.0  41.0  MCV 94.4  --   PLT 149*  --    Basic Metabolic Panel: Recent Labs  Lab 07/10/24 0227 07/10/24 0236 07/11/24 0249  NA 134* 138  138 133*  K 4.3 4.1  4.3 4.7  CL 104 105 102  CO2 19*  --  19*  GLUCOSE 103* 97 89  BUN 50* 51* 49*  CREATININE 2.49* 2.50* 2.41*  CALCIUM  9.9  --  9.9  MG  --   --  2.2   Liver Function Tests: Recent Labs  Lab 07/10/24 0227  AST 31  ALT 35  ALKPHOS 89  BILITOT 3.3*  PROT 7.1  ALBUMIN  3.5   CBG: No results for input(s): GLUCAP in the last 168 hours.  Scheduled Meds:  amiodarone   200 mg Oral Daily   apixaban   5 mg Oral BID   atorvastatin   40 mg Oral Daily   Chlorhexidine  Gluconate Cloth  6 each Topical Daily   ferrous sulfate   325 mg Oral Daily   Influenza vac split trivalent PF  0.5 mL Intramuscular Tomorrow-1000   sodium chloride  flush  10-40 mL Intracatheter Q12H   sodium chloride  flush  3 mL Intravenous Q12H   Continuous Infusions:  sodium chloride      furosemide  160 mg (07/11/24 0845)   milrinone      PRN Meds:.sodium chloride , acetaminophen , ondansetron  (ZOFRAN ) IV, sodium chloride  flush, sodium chloride  flush  Family Communication: None at bedside  Disposition: Status is: Inpatient Remains inpatient appropriate because: Acute HFrEF exacerbation     Time spent: 39 minutes  Length of inpatient stay: 1 days  Author: Carliss LELON Canales, DO 07/11/2024 12:48 PM  For on call review [email or web address].

## 2024-07-11 NOTE — Progress Notes (Signed)
 Seen on afternoon rounds.  CO-OX 56% on 0.125 milrinone . He's had minimal UOP so far today with 160 mg IV lasix . CVP 25.   Increase milrinone  to 0.25 mcg/kg/min. Give 160 mg bolus IV lasix  and start lasix  gtt at 20 mg/hr.  Manuelita Dutch, PA-C
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
## 2024-07-11 NOTE — Progress Notes (Signed)
 Advanced Heart Failure Rounding Note  Cardiologist: Vinie JAYSON Maxcy, MD  HF Cardiologist: Dr. [name]  Chief Complaint: CHF  Subjective:    Feels okay. Still having some trouble with orthopnea. LE edema slightly improved.  300 cc UOP charted last 24 hrs. Received IV lasix  80 BID yesterday.   Objective:   Weight Range: 94.7 kg Body mass index is 33.7 kg/m.   Vital Signs:   Temp:  [96.6 F (35.9 C)-97.7 F (36.5 C)] 97.6 F (36.4 C) (10/17 0300) Pulse Rate:  [63-68] 63 (10/17 0300) Resp:  [9-24] 20 (10/17 0300) BP: (110-145)/(46-72) 145/60 (10/17 0300) SpO2:  [94 %-100 %] 97 % (10/17 0300) Weight:  [93.6 kg-94.7 kg] 94.7 kg (10/17 0317) Last BM Date : 07/09/24  Weight change: Filed Weights   07/10/24 1536 07/11/24 0317  Weight: 93.6 kg 94.7 kg    Intake/Output:   Intake/Output Summary (Last 24 hours) at 07/11/2024 0649 Last data filed at 07/10/2024 2100 Gross per 24 hour  Intake 120 ml  Output 300 ml  Net -180 ml      Physical Exam    General:  Fatigued appearing elderly male Neck: JVP to jaw Cor: Regular rate & rhythm. No murmurs. Lungs: crackles in bases Abdomen: obese, distended Extremities: 2-3+ edema, + UNNA boots Neuro: Alert & orientedx3. Affect pleasant   Telemetry   SR 60s   Labs    CBC Recent Labs    07/10/24 0227 07/10/24 0236  WBC 8.6  --   NEUTROABS 6.8  --   HGB 11.8* 13.6  13.9  HCT 37.2* 40.0  41.0  MCV 94.4  --   PLT 149*  --    Basic Metabolic Panel Recent Labs    89/83/74 0227 07/10/24 0236 07/11/24 0249  NA 134* 138  138 133*  K 4.3 4.1  4.3 4.7  CL 104 105 102  CO2 19*  --  19*  GLUCOSE 103* 97 89  BUN 50* 51* 49*  CREATININE 2.49* 2.50* 2.41*  CALCIUM  9.9  --  9.9   Liver Function Tests Recent Labs    07/10/24 0227  AST 31  ALT 35  ALKPHOS 89  BILITOT 3.3*  PROT 7.1  ALBUMIN  3.5   No results for input(s): LIPASE, AMYLASE in the last 72 hours. Cardiac Enzymes No results for  input(s): CKTOTAL, CKMB, CKMBINDEX, TROPONINI in the last 72 hours.  BNP: BNP (last 3 results) Recent Labs    05/16/24 1308 06/25/24 1604 07/10/24 0227  BNP 618.2* 1,585.3* 1,952.1*    ProBNP (last 3 results) No results for input(s): PROBNP in the last 8760 hours.   D-Dimer No results for input(s): DDIMER in the last 72 hours. Hemoglobin A1C No results for input(s): HGBA1C in the last 72 hours. Fasting Lipid Panel No results for input(s): CHOL, HDL, LDLCALC, TRIG, CHOLHDL, LDLDIRECT in the last 72 hours. Thyroid Function Tests No results for input(s): TSH, T4TOTAL, T3FREE, THYROIDAB in the last 72 hours.  Invalid input(s): FREET3  Other results:   Imaging    CT Chest Wo Contrast Result Date: 07/10/2024 CLINICAL DATA:  Cough with respiratory illness. Right upper lobe opacity seen on chest x-ray earlier today. EXAM: CT CHEST WITHOUT CONTRAST TECHNIQUE: Multidetector CT imaging of the chest was performed following the standard protocol without IV contrast. RADIATION DOSE REDUCTION: This exam was performed according to the departmental dose-optimization program which includes automated exposure control, adjustment of the mA and/or kV according to patient size and/or use of iterative reconstruction  technique. COMPARISON:  Chest x-ray earlier today.  Chest CT 09/08/2019 FINDINGS: Cardiovascular: The heart is enlarged. Coronary artery calcification is evident. Moderate atherosclerotic calcification is noted in the wall of the thoracic aorta. Left-sided pacer/AICD evident. Mediastinum/Nodes: Mild pre-vascular lymphadenopathy evident with 11 mm short axis lymph node seen on 42/2. 12 mm short axis precarinal node on 52/2. No evidence for gross hilar lymphadenopathy although assessment is limited by the lack of intravenous contrast on the current study. The esophagus has normal imaging features. Borderline lymphadenopathy in the right axilla. Lungs/Pleura:  Biapical pleuroparenchymal scarring evident. Mosaic attenuation in the lungs bilaterally is nonspecific but may reflect air trapping from small airways disease. 7 mm posterior right pulmonary nodule on 59/4 is new since prior chest CT. 12 mm right lower lobe ground-glass opacity identified on 75/4. Subsegmental atelectasis noted in the left upper and lower lobes. Small bilateral pleural effusions. Upper Abdomen: Liver is heterogeneous with irregular contour suggesting cirrhosis. Comparing to abdomen CT of 05/04/2024, new subtle hypodense lesions are seen in the hepatic dome measuring 4.5 cm on image 116/2 and in the lateral segment left liver measuring 3.9 cm on image 119/2. There is probably a third 2.8 cm lesion in the caudate lobe on image 128/2. Calcified gallstones evident. New ascites is seen around the liver and the spleen. Musculoskeletal: No worrisome lytic or sclerotic osseous abnormality. IMPRESSION: 1. Heterogeneous liver with irregular contour suggesting cirrhosis. New subtle hypodense lesions in the hepatic dome and lateral segment left liver. There is probably a third lesion in the caudate lobe. Imaging features are concerning for metastatic disease. Dedicated CT abdomen and pelvis recommended with intravenous contrast material if renal function permits. 2. 7 mm posterior right pulmonary nodule is new since prior chest CT. 12 mm right lower lobe ground-glass opacity. Initial follow-up with CT at 6 months is recommended to confirm persistence. If persistent, repeat CT is recommended every 2 years until 5 years of stability has been established. This recommendation follows the consensus statement: Guidelines for Management of Incidental Pulmonary Nodules Detected on CT Images: From the Fleischner Society 2017; Radiology 2017; 284:228-243. 3. Mild mediastinal lymphadenopathy. 4. Mosaic attenuation in the lungs bilaterally is nonspecific but may reflect air trapping from small airways disease. 5. Small  bilateral pleural effusions. 6. New ascites around the liver and spleen. 7. Cholelithiasis. 8.  Aortic Atherosclerosis (ICD10-I70.0). Electronically Signed   By: Camellia Candle M.D.   On: 07/10/2024 07:12     Medications:     Scheduled Medications:  amiodarone   200 mg Oral Daily   apixaban   5 mg Oral BID   atorvastatin   40 mg Oral Daily   ferrous sulfate   325 mg Oral Daily   Influenza vac split trivalent PF  0.5 mL Intramuscular Tomorrow-1000   sodium chloride  flush  3 mL Intravenous Q12H    Infusions:  sodium chloride      furosemide       PRN Medications: sodium chloride , acetaminophen , ondansetron  (ZOFRAN ) IV, sodium chloride  flush    Patient Profile   73 y/o male w/ chronic systolic heart failure d/t ICM, CAD, MR, PAF, H/o VT/VF s/p ICD and CKD IIIb admitted w/ a/c CHF and AKI.   Assessment/Plan   Chronic systolic heart failure: Primarily ischemic, prior history of infarcted RCA.   - Most recent 2D echo 8/25 EF 35-40%, RV mildly reduced  - NYHA IIIb w/ volume overload - LA 1.7> 2.3 yesterday. Recheck this am.  - Sluggish response to IV lasix  so far. Increasing to 160  mg IV BID. He required milrinone  previous admission, low threshold to add if difficulty diuresing. Place PICC to follow CVP and CO-OX. - CKD limits MRA and RAASi - given poor tolerance to Imdur /Hydralazine , will hold off on restarting for now  - No SGLT-2 with recent UTI - no ? blocker w/ concern for low output  - Continue UNNA boots - ICD in place. If continues w/ persistently low EF may benefit for CRT upgrade, QRS borderline at 135 ms    Mitral regurgitation: 2/2 ischemic disease.  TEE 9/25 with more moderate MR by PISA, valve area, systolic blunting of pulmonary veins. - HF optimization w/ GDMT and diuresis    AKI on CKD stage IIIb:  - Recent creatinine 1.8, but up to 2.5 on admission. Scr 2.41 today - Suspect cardiorenal - place unna boots and follow SCr w/ diuresis  - no SGLT2i given h/o UTIs    PAF - Previously diagnosed on device - observed runs of possible AT vs AFL while in the ED - SR today - Continue amio 200 mg daily  - Continue Eliquis  5 mg twice a day     Hx VT/VF - Has single chamber ICD - Continue amiodarone  200 mg daily   CAD: Single vessel RCA disease - No anginal pain - No aspirin  with need for anticoagulation. Continue statin   Abnormal Chest CT  - posterior right pulmonary nodule is new since prior study + possible cirrhosis + hepatic lesions concerning for ? Metastatic dz, though liver cirrhosis may be 2/2 HF - differ further w/u to IM/PCP   Length of Stay: 1  Geza Beranek N, PA-C  07/11/2024, 6:49 AM  Advanced Heart Failure Team Pager [phone] (M-F; 7a - 5p)  Please contact [facility] for night-coverage after hours (5p -7a ) and weekends on [email or web address]

## 2024-07-11 NOTE — Progress Notes (Signed)
 Peripherally Inserted Central Catheter Placement  The IV Nurse has discussed with the patient and/or persons authorized to consent for the patient, the purpose of this procedure and the potential benefits and risks involved with this procedure.  The benefits include less needle sticks, lab draws from the catheter, and the patient may be discharged home with the catheter. Risks include, but not limited to, infection, bleeding, blood clot (thrombus formation), and puncture of an artery; nerve damage and irregular heartbeat and possibility to perform a PICC exchange if needed/ordered by physician.  Alternatives to this procedure were also discussed.  Bard Power PICC patient education guide, fact sheet on infection prevention and patient information card has been provided to patient /or left at bedside.    PICC Placement Documentation  PICC Triple Lumen 07/11/24 Right Basilic 38 cm 0 cm (Active)  Indication for Insertion or Continuance of Line Vasoactive infusions 07/11/24 1202  Exposed Catheter (cm) 0 cm 07/11/24 1202  Site Assessment Clean, Dry, Intact 07/11/24 1202  Lumen #1 Status Flushed;Saline locked;Blood return noted 07/11/24 1202  Lumen #2 Status Flushed;Saline locked;Blood return noted 07/11/24 1202  Lumen #3 Status Flushed;Saline locked;Blood return noted 07/11/24 1202  Dressing Type Transparent;Securing device 07/11/24 1202  Dressing Status Antimicrobial disc/dressing in place;Clean, Dry, Intact 07/11/24 1202  Line Care Connections checked and tightened 07/11/24 1202  Line Adjustment (NICU/IV Team Only) No 07/11/24 1202  Dressing Intervention New dressing;Adhesive placed at insertion site (IV team only) 07/11/24 1202  Dressing Change Due 07/18/24 07/11/24 1202       Renaee Notice Albarece 07/11/2024, 12:03 PM

## 2024-07-11 NOTE — Progress Notes (Signed)
 Heart Failure Navigator Progress Note  Assessed for Heart & Vascular TOC clinic readiness.  Patient does not meet criteria due to he is an Advanced Heart Failure Team patient of Dr. [name]. .   Navigator will sign off at this time.   Stephane Haddock, BSN, Scientist, clinical (histocompatibility and immunogenetics) Only

## 2024-07-11 NOTE — Evaluation (Signed)
 Physical Therapy Evaluation Patient Details Name: Dean Sproles Sr. MRN: [id number] DOB: [birth year] Today's Date: 07/11/2024  History of Present Illness  73 y.o. M adm 07/10/24 with weakness and fluid retention. PMHx: HLD, HFrEF, ICD, PAF on Eliquis , HTN, CAD, HLD, OSA, T2DM, CKD, HTN, laminectomy  Clinical Impression  PT pleasant and reports family available at home but pt also with son he cares for. Pt states continued decline the last 3 months but still working part time in [employer] and normally walks without AD. This date pt with fatigue and weakness requiring RW and limited to 54' at a time. Pt with decreased strength, function, mobility and independence who will benefit from acute therapy to maximize mobility and safety. Encouraged OOB and continued gait with staff.   HR 77-84 SPo2 97% on RA        If plan is discharge home, recommend the following: A little help with walking and/or transfers;A little help with bathing/dressing/bathroom;Assistance with cooking/housework   Can travel by private vehicle        Equipment Recommendations None recommended by PT  Recommendations for Other Services       Functional Status Assessment Patient has had a recent decline in their functional status and demonstrates the ability to make significant improvements in function in a reasonable and predictable amount of time.     Precautions / Restrictions Precautions Precautions: Fall Recall of Precautions/Restrictions: Intact      Mobility  Bed Mobility               General bed mobility comments: sitting EOB on arrival    Transfers Overall transfer level: Needs assistance   Transfers: Sit to/from Stand Sit to Stand: Supervision           General transfer comment: supervision for safety with cues for hand placement. pt able to rise from bed and perform 5 repeated sit to stands from recliner with reliance on UB in 20 sec    Ambulation/Gait Ambulation/Gait  assistance: Supervision Gait Distance (Feet): 90 Feet Assistive device: Rolling walker (2 wheels) Gait Pattern/deviations: Step-through pattern, Decreased stride length, Trunk flexed   Gait velocity interpretation: <1.8 ft/sec, indicate of risk for recurrent falls   General Gait Details: pt initially requesting cane for gait but reaching for environment within first few feet with transition to RW. Pt walked 90' x 2 trials with seated rest, limited by fatigue  Stairs            Wheelchair Mobility     Tilt Bed    Modified Rankin (Stroke Patients Only)       Balance Overall balance assessment: Mild deficits observed, not formally tested                                           Pertinent Vitals/Pain Pain Assessment Pain Assessment: No/denies pain    Home Living Family/patient expects to be discharged to:: Private residence Living Arrangements: Children Available Help at Discharge: Family;Available 24 hours/day Type of Home: House Home Access: Level entry       Home Layout: Two level;Bed/bath upstairs Home Equipment: Grab bars - toilet;Cane - single point;Shower seat;BSC/3in1;Rolling Environmental consultant (2 wheels)      Prior Function Prior Level of Function : Independent/Modified Independent;Driving;Working/employed             Mobility Comments: intermittent use of cane ADLs Comments: independent in ADLs, drives, Holiday representative  part time, framing work     Extremity/Trunk Assessment   Upper Extremity Assessment Upper Extremity Assessment: Generalized weakness    Lower Extremity Assessment Lower Extremity Assessment: Generalized weakness    Cervical / Trunk Assessment Cervical / Trunk Assessment: Kyphotic  Communication   Communication Communication: No apparent difficulties    Cognition Arousal: Alert Behavior During Therapy: Flat affect   PT - Cognitive impairments: No apparent impairments                         Following  commands: Intact       Cueing Cueing Techniques: Verbal cues     General Comments      Exercises     Assessment/Plan    PT Assessment Patient needs continued PT services  PT Problem List Decreased strength;Decreased activity tolerance;Decreased balance;Decreased mobility;Decreased knowledge of use of DME       PT Treatment Interventions DME instruction;Balance training;Gait training;Stair training;Functional mobility training;Therapeutic activities;Therapeutic exercise;Patient/family education    PT Goals (Current goals can be found in the Care Plan section)  Acute Rehab PT Goals Patient Stated Goal: return to home and work PT Goal Formulation: With patient Time For Goal Achievement: 07/25/24 Potential to Achieve Goals: Good    Frequency Min 2X/week     Co-evaluation               AM-PAC PT 6 Clicks Mobility  Outcome Measure Help needed turning from your back to your side while in a flat bed without using bedrails?: None Help needed moving from lying on your back to sitting on the side of a flat bed without using bedrails?: None Help needed moving to and from a bed to a chair (including a wheelchair)?: A Little Help needed standing up from a chair using your arms (e.g., wheelchair or bedside chair)?: A Little Help needed to walk in hospital room?: A Little Help needed climbing 3-5 steps with a railing? : A Little 6 Click Score: 20    End of Session   Activity Tolerance: Patient tolerated treatment well Patient left: in chair;with call bell/phone within reach Nurse Communication: Mobility status PT Visit Diagnosis: Other abnormalities of gait and mobility (R26.89);Difficulty in walking, not elsewhere classified (R26.2);Muscle weakness (generalized) (M62.81)    Time: 8943-8882 PT Time Calculation (min) (ACUTE ONLY): 21 min   Charges:   PT Evaluation $PT Eval Moderate Complexity: 1 Mod   PT General Charges $$ ACUTE PT VISIT: 1 Visit         Lenoard SQUIBB, PT Acute Rehabilitation Services Office: [phone]   Lenoard NOVAK Offie Waide 07/11/2024, 12:11 PM

## 2024-07-11 NOTE — Progress Notes (Signed)
 Initial Nutrition Assessment  DOCUMENTATION CODES:   Non-severe (moderate) malnutrition in context of chronic illness  INTERVENTION:  Diet Heart 1500 mL Fluid Ensure Plus High Protein po BID, each supplement provides 350 kcal and 20 grams of protein. MVI w/minerals daily   Provided Heart Failure diet education    NUTRITION DIAGNOSIS:   Moderate Malnutrition related to chronic illness as evidenced by energy intake < 75% for > or equal to 1 month, mild fat depletion, mild muscle depletion.   GOAL:   Patient will meet greater than or equal to 90% of their needs   MONITOR:   PO intake, Supplement acceptance  REASON FOR ASSESSMENT:   Consult Diet education  ASSESSMENT:   medical history significant of HFrEF, VT s/p AICD, PAF, hyperlipidemia, CKD IV and T2DM presents with weakness and fluid retention after taking isosorbide  and hydralazine .  Patient seen in room, sitting up eating lunch. Reports poor po intake for the past 3 weeks due to decreased appetite, eating one meal per day. Reports he ate his toast and a piece of sausage for breakfast. Dissussed with patient diet recommendations for low sodium and fluid restriction to help manage HF, gave patient Nutrition for Heart Failure handout from nutrition care manual. Patient reports usual dry weight of 186 lbs, weight currently up to 208 lbs. Patient reports fluid retention mainly happens in his legs. Currently on milrinone  drip and getting lasix  boluses. Encouraged patient to eat what he can from meals and the supplement what he cannot finish with Ensure BID.     Meds: ferrous sulfate , lasix  160 mg bolus x2 , milrinone  @ 3.55 ml/hr  Labs: Sodium 133, BUN 49, Cr 2.41    NUTRITION - FOCUSED PHYSICAL EXAM:  Flowsheet Row Most Recent Value  Orbital Region No depletion  Upper Arm Region Moderate depletion  Thoracic and Lumbar Region Mild depletion  Buccal Region No depletion  Temple Region No depletion  Clavicle Bone Region  Mild depletion  Clavicle and Acromion Bone Region Mild depletion  Scapular Bone Region No depletion  Patellar Region Unable to assess  [edema]  Anterior Thigh Region Mild depletion  Posterior Calf Region Unable to assess  [edema]  Edema (RD Assessment) Moderate  Hair Reviewed  Eyes Reviewed  Mouth Reviewed  Skin Reviewed  Nails Reviewed    Diet Order:   Diet Order             Diet Heart Room service appropriate? Yes; Fluid consistency: Thin; Fluid restriction: 1500 mL Fluid  Diet effective now                   EDUCATION NEEDS:   Education needs have been addressed  Skin:  Skin Assessment: Reviewed RN Assessment  Last BM:  10/17  Height:   Ht Readings from Last 1 Encounters:  07/10/24 5' 6 (1.676 m)    Weight:   Wt Readings from Last 1 Encounters:  07/11/24 94.7 kg    Ideal Body Weight:  72.7 kg  BMI:  Body mass index is 33.7 kg/m.  Estimated Nutritional Needs:   Kcal:  1800-2200 kcals  Protein:  87-109 grams  Fluid:  </= 1.5L/d  Keng Jewel, MS, RD, LDN Clinical Dietitian  Contact via secure chat. If unavailable, use group chat RD Inpatient.

## 2024-07-11 NOTE — Hospital Course (Signed)
 73 y.o. male with medical history significant of HFrEF, VT s/p AICD, PAF, hyperlipidemia, CKD IV and T2DM presents with weakness and fluid retention, found to have exacerbation of HFrEF.   Assessment and Plan:   Acute exacerbation of HFrEF/moderate mitral regurgitation - Last noted EF 35-40% with grade 2 DD.  History of previous hospitalization requiring inotropic support for diuresis.  Cardiology/heart failure following closely.  PICC line and milrinone .  Continues on Lasix  drip, metolazone  x 1.  GDMT limited by various intolerances.  Monitoring urine output closely (not accurate per night).  May pursue RHC early next week.  ICD in place.  May benefit from CRT.   Acute kidney injury on CKD 3B - Creatinine showing mild improvement from yesterday, down from 2.66 now 2.48.  Likely cardiorenal etiology.  Avoiding nephrotoxic agents.  Monitor urine output, will recheck BMP in AM.   Paroxysmal atrial fibrillation - Continue amiodarone  and Eliquis .  Currently NSR.   History of VT s/p ICD - Amiodarone  on board.  Telemetry.   Diabetes mellitus - Insulin  sliding scale on board.   Anemia of chronic disease - No active bleeding.  Hemoglobin stable.  Will continue to monitor.   Possible cirrhosis/liver lesions - Noted on CT, heterogenous liver with irregular contour suggesting cirrhosis.  Also noting lesions in the liver as well as a single RLL lung nodule.  Consider additional workup after resolution of above.   Physical debilitation muscle weakness - PT/OT on board.

## 2024-07-12 DIAGNOSIS — I5020 Unspecified systolic (congestive) heart failure: Secondary | ICD-10-CM | POA: Diagnosis not present

## 2024-07-12 DIAGNOSIS — I5043 Acute on chronic combined systolic (congestive) and diastolic (congestive) heart failure: Secondary | ICD-10-CM | POA: Diagnosis not present

## 2024-07-12 DIAGNOSIS — I480 Paroxysmal atrial fibrillation: Secondary | ICD-10-CM | POA: Diagnosis not present

## 2024-07-12 DIAGNOSIS — I10 Essential (primary) hypertension: Secondary | ICD-10-CM | POA: Diagnosis not present

## 2024-07-12 DIAGNOSIS — E440 Moderate protein-calorie malnutrition: Secondary | ICD-10-CM | POA: Insufficient documentation

## 2024-07-12 DIAGNOSIS — N179 Acute kidney failure, unspecified: Secondary | ICD-10-CM | POA: Diagnosis not present

## 2024-07-12 LAB — CBC
HCT: 31.9 % — ABNORMAL LOW (ref 39.0–52.0)
Hemoglobin: 10.4 g/dL — ABNORMAL LOW (ref 13.0–17.0)
MCH: 30 pg (ref 26.0–34.0)
MCHC: 32.6 g/dL (ref 30.0–36.0)
MCV: 91.9 fL (ref 80.0–100.0)
Platelets: 108 K/uL — ABNORMAL LOW (ref 150–400)
RBC: 3.47 MIL/uL — ABNORMAL LOW (ref 4.22–5.81)
RDW: 17.6 % — ABNORMAL HIGH (ref 11.5–15.5)
WBC: 8.9 K/uL (ref 4.0–10.5)
nRBC: 0 % (ref 0.0–0.2)

## 2024-07-12 LAB — BASIC METABOLIC PANEL WITH GFR
Anion gap: 10 (ref 5–15)
BUN: 52 mg/dL — ABNORMAL HIGH (ref 8–23)
CO2: 23 mmol/L (ref 22–32)
Calcium: 9.5 mg/dL (ref 8.9–10.3)
Chloride: 100 mmol/L (ref 98–111)
Creatinine, Ser: 2.48 mg/dL — ABNORMAL HIGH (ref 0.61–1.24)
GFR, Estimated: 27 mL/min — ABNORMAL LOW (ref >60)
Glucose, Bld: 117 mg/dL — ABNORMAL HIGH (ref 70–99)
Potassium: 3.9 mmol/L (ref 3.5–5.1)
Sodium: 133 mmol/L — ABNORMAL LOW (ref 135–145)

## 2024-07-12 LAB — COOXEMETRY PANEL
Carboxyhemoglobin: 1.9 % — ABNORMAL HIGH (ref 0.5–1.5)
Methemoglobin: <0.7 % (ref 0.0–1.5)
O2 Saturation: 67.4 %
Total hemoglobin: 11 g/dL — ABNORMAL LOW (ref 12.0–16.0)

## 2024-07-12 LAB — MAGNESIUM: Magnesium: 2.1 mg/dL (ref 1.7–2.4)

## 2024-07-12 LAB — LACTIC ACID, PLASMA: Lactic Acid, Venous: 1.4 mmol/L (ref 0.5–1.9)

## 2024-07-12 MED ORDER — POTASSIUM CHLORIDE CRYS ER 20 MEQ PO TBCR
40.0000 meq | EXTENDED_RELEASE_TABLET | ORAL | Status: AC
  Administered 2024-07-12 (×2): 40 meq via ORAL
  Filled 2024-07-12 (×2): qty 2

## 2024-07-12 MED ADMIN — Metolazone Tab 5 MG: 5 mg | ORAL | NDC 81005012501

## 2024-07-12 MED FILL — Metolazone Tab 5 MG: 5.0000 mg | ORAL | Qty: 1 | Status: AC

## 2024-07-12 NOTE — Progress Notes (Signed)
 Progress Note   Patient: Dean Meiklejohn Sr. FMW:[id number] DOB: [birth year] DOA: 07/10/2024  DOS: the patient was seen and examined on 07/12/2024   Brief hospital course:  73 y.o. male with medical history significant of HFrEF, VT s/p AICD, PAF, hyperlipidemia, CKD IV and T2DM presents with weakness and fluid retention, found to have exacerbation of HFrEF.   Assessment and Plan:   Acute exacerbation of HFrEF/moderate mitral regurgitation - Last noted EF 35-40% with grade 2 DD.  History of previous hospitalization requiring inotropic support for diuresis.  Cardiology/heart failure following closely.  PICC line and milrinone .  Continues on Lasix  drip, metolazone  x 1.  GDMT limited by various intolerances.  Monitoring urine output closely (not accurate per night).  May pursue RHC early next week.  ICD in place.  May benefit from CRT.   Acute kidney injury on CKD 3B - Creatinine showing mild improvement from yesterday, down from 2.66 now 2.48.  Likely cardiorenal etiology.  Avoiding nephrotoxic agents.  Monitor urine output, will recheck BMP in AM.   Paroxysmal atrial fibrillation - Continue amiodarone  and Eliquis .  Currently NSR.   History of VT s/p ICD - Amiodarone  on board.  Telemetry.   Diabetes mellitus - Insulin  sliding scale on board.   Anemia of chronic disease - No active bleeding.  Hemoglobin stable.  Will continue to monitor.   Possible cirrhosis/liver lesions - Noted on CT, heterogenous liver with irregular contour suggesting cirrhosis.  Also noting lesions in the liver as well as a single RLL lung nodule.  Consider additional workup after resolution of above.   Physical debilitation muscle weakness - PT/OT on board.   Subjective: Patient resting comfortably this morning.  Urine output not recorded correctly but seems adequate.  Denies any worsening shortness of breath, pain.  No acute events overnight.  Physical Exam:  Vitals:   07/12/24 0441 07/12/24 0442 07/12/24  0742 07/12/24 1152  BP: (!) 143/60 (!) 143/60 (!) 138/51 (!) 134/45  Pulse: 68 68 74 73  Resp: 16 19 18 19   Temp: (!) 97.5 F (36.4 C)  97.6 F (36.4 C) 97.6 F (36.4 C)  TempSrc: Oral  Oral Oral  SpO2: 98% 98% 100% 100%  Weight: 95.6 kg     Height:        GENERAL:  Alert, pleasant, no acute distress  HEENT:  EOMI CARDIOVASCULAR:  RRR, systolic murmur appreciated RESPIRATORY:  Clear to auscultation, no wheezing, rales, or rhonchi GASTROINTESTINAL:  Soft, nontender, nondistended EXTREMITIES: Bandages over BL LE, 1-2+ BL LE pitting edema NEURO:  No new focal deficits appreciated SKIN:  No rashes noted PSYCH:  Appropriate mood and affect    Data Reviewed:  Imaging Studies: US  EKG SITE RITE Result Date: 07/11/2024 If Site Rite image not attached, placement could not be confirmed due to current cardiac rhythm.  CT Chest Wo Contrast Result Date: 07/10/2024 CLINICAL DATA:  Cough with respiratory illness. Right upper lobe opacity seen on chest x-ray earlier today. EXAM: CT CHEST WITHOUT CONTRAST TECHNIQUE: Multidetector CT imaging of the chest was performed following the standard protocol without IV contrast. RADIATION DOSE REDUCTION: This exam was performed according to the departmental dose-optimization program which includes automated exposure control, adjustment of the mA and/or kV according to patient size and/or use of iterative reconstruction technique. COMPARISON:  Chest x-ray earlier today.  Chest CT 09/08/2019 FINDINGS: Cardiovascular: The heart is enlarged. Coronary artery calcification is evident. Moderate atherosclerotic calcification is noted in the wall of the thoracic aorta. Left-sided pacer/AICD evident. Mediastinum/Nodes:  Mild pre-vascular lymphadenopathy evident with 11 mm short axis lymph node seen on 42/2. 12 mm short axis precarinal node on 52/2. No evidence for gross hilar lymphadenopathy although assessment is limited by the lack of intravenous contrast on the  current study. The esophagus has normal imaging features. Borderline lymphadenopathy in the right axilla. Lungs/Pleura: Biapical pleuroparenchymal scarring evident. Mosaic attenuation in the lungs bilaterally is nonspecific but may reflect air trapping from small airways disease. 7 mm posterior right pulmonary nodule on 59/4 is new since prior chest CT. 12 mm right lower lobe ground-glass opacity identified on 75/4. Subsegmental atelectasis noted in the left upper and lower lobes. Small bilateral pleural effusions. Upper Abdomen: Liver is heterogeneous with irregular contour suggesting cirrhosis. Comparing to abdomen CT of 05/04/2024, new subtle hypodense lesions are seen in the hepatic dome measuring 4.5 cm on image 116/2 and in the lateral segment left liver measuring 3.9 cm on image 119/2. There is probably a third 2.8 cm lesion in the caudate lobe on image 128/2. Calcified gallstones evident. New ascites is seen around the liver and the spleen. Musculoskeletal: No worrisome lytic or sclerotic osseous abnormality. IMPRESSION: 1. Heterogeneous liver with irregular contour suggesting cirrhosis. New subtle hypodense lesions in the hepatic dome and lateral segment left liver. There is probably a third lesion in the caudate lobe. Imaging features are concerning for metastatic disease. Dedicated CT abdomen and pelvis recommended with intravenous contrast material if renal function permits. 2. 7 mm posterior right pulmonary nodule is new since prior chest CT. 12 mm right lower lobe ground-glass opacity. Initial follow-up with CT at 6 months is recommended to confirm persistence. If persistent, repeat CT is recommended every 2 years until 5 years of stability has been established. This recommendation follows the consensus statement: Guidelines for Management of Incidental Pulmonary Nodules Detected on CT Images: From the Fleischner Society 2017; Radiology 2017; 284:228-243. 3. Mild mediastinal lymphadenopathy. 4. Mosaic  attenuation in the lungs bilaterally is nonspecific but may reflect air trapping from small airways disease. 5. Small bilateral pleural effusions. 6. New ascites around the liver and spleen. 7. Cholelithiasis. 8.  Aortic Atherosclerosis (ICD10-I70.0). Electronically Signed   By: Camellia Candle M.D.   On: 07/10/2024 07:12   DG Chest Port 1 View Result Date: 07/10/2024 EXAM: 1 VIEW(S) XRAY OF THE CHEST 07/10/2024 02:10:00 AM COMPARISON: 06/25/2024 CLINICAL HISTORY: DOE. Lethargic, leg swelling. FINDINGS: LINES, TUBES AND DEVICES: Left subclavian ICD. LUNGS AND PLEURA: Mild right basilar opacity, likely atelectasis. Faint patchy / nodular opacities in the right upper lobe, persistent, although possibly osseous. No pulmonary edema. No pleural effusion. No pneumothorax. HEART AND MEDIASTINUM: Cardiomegaly. Thoracic aortic atherosclerosis. BONES AND SOFT TISSUES: No acute osseous abnormality. IMPRESSION: 1. Cardiomegaly. No frank interstitial edema. 2. Faint patchy/nodular opacities in the right upper lobe, equivocal. Consider chest CT for further characterization if clinically warranted. 3. Mild right basilar opacity, likely atelectasis. Electronically signed by: Pinkie Pebbles MD 07/10/2024 02:18 AM EDT RP Workstation: [id number]   DG Chest Portable 1 View Result Date: 06/25/2024 CLINICAL DATA:  Increased shortness of breath and peripheral edema. EXAM: PORTABLE CHEST 1 VIEW COMPARISON:  05/07/2024 FINDINGS: Enlarged cardiac silhouette with an interval increase in size. Stable left subclavian AICD lead. Mild diffuse peribronchial thickening without significant change. Interval minimal patchy density in the right mid to upper lung zone. No acute bony abnormality. IMPRESSION: 1. Interval minimal patchy density in the right mid to upper lung zone, suspicious for pneumonia. 2. Mildly progressive cardiomegaly. 3. Stable mild bronchitic changes.  Electronically Signed   By: Elspeth Bathe M.D.   On: 06/25/2024 17:09     There are no new results to review at this time.  Previous records (including but not limited to H&P, progress notes, nursing notes, TOC management) were reviewed in assessment of this patient.  Labs: CBC: Recent Labs  Lab 07/10/24 0227 07/10/24 0236 07/12/24 0500  WBC 8.6  --  8.9  NEUTROABS 6.8  --   --   HGB 11.8* 13.6  13.9 10.4*  HCT 37.2* 40.0  41.0 31.9*  MCV 94.4  --  91.9  PLT 149*  --  108*   Basic Metabolic Panel: Recent Labs  Lab 07/10/24 0227 07/10/24 0236 07/11/24 0249 07/11/24 1530 07/12/24 0500  NA 134* 138  138 133* 134* 133*  K 4.3 4.1  4.3 4.7 4.3 3.9  CL 104 105 102 101 100  CO2 19*  --  19* 22 23  GLUCOSE 103* 97 89 134* 117*  BUN 50* 51* 49* 52* 52*  CREATININE 2.49* 2.50* 2.41* 2.66* 2.48*  CALCIUM  9.9  --  9.9 9.8 9.5  MG  --   --  2.2  --  2.1   Liver Function Tests: Recent Labs  Lab 07/10/24 0227  AST 31  ALT 35  ALKPHOS 89  BILITOT 3.3*  PROT 7.1  ALBUMIN  3.5   CBG: No results for input(s): GLUCAP in the last 168 hours.  Scheduled Meds:  amiodarone   200 mg Oral Daily   apixaban   5 mg Oral BID   atorvastatin   40 mg Oral Daily   Chlorhexidine  Gluconate Cloth  6 each Topical Daily   feeding supplement  237 mL Oral BID BM   ferrous sulfate   325 mg Oral Daily   multivitamin with minerals  1 tablet Oral Daily   potassium chloride   40 mEq Oral Q4H   sodium chloride  flush  10-40 mL Intracatheter Q12H   sodium chloride  flush  3 mL Intravenous Q12H   Continuous Infusions:  furosemide  (LASIX ) 200 mg in dextrose  5 % 100 mL (2 mg/mL) infusion 20 mg/hr (07/12/24 1332)   milrinone  0.25 mcg/kg/min (07/12/24 0229)   PRN Meds:.acetaminophen , melatonin, ondansetron  (ZOFRAN ) IV, polyethylene glycol, sodium chloride  flush, sodium chloride  flush  Family Communication: None at bedside  Disposition: Status is: Inpatient Remains inpatient appropriate because: CHF     Time spent: 40 minutes  Length of inpatient stay: 2  days  Author: Carliss LELON Canales, DO 07/12/2024 2:51 PM  For on call review [email or web address].

## 2024-07-12 NOTE — Progress Notes (Addendum)
 Advanced Heart Failure Rounding Note  Cardiologist: Vinie JAYSON Maxcy, MD  HF Cardiologist: Dr. [name]  Chief Complaint: CHF  Subjective:    Not much urine output listed but incomplete as patient has been using toilet.  He is on milrinone  0.25 now, co-ox up to 67%.  BP remains stable.  Lasix  20 mg/hr, CVP still 20. Creatinine 2.66 => 2.48.   Says he feels better than when he arrived.   He is in NSR today on apixaban .    Objective:   Weight Range: 95.6 kg Body mass index is 34.02 kg/m.   Vital Signs:   Temp:  [96.9 F (36.1 C)-98 F (36.7 C)] 97.6 F (36.4 C) (10/18 0742) Pulse Rate:  [65-84] 74 (10/18 0742) Resp:  [16-20] 18 (10/18 0742) BP: (116-143)/(47-69) 138/51 (10/18 0742) SpO2:  [94 %-100 %] 100 % (10/18 0742) Weight:  [95.6 kg] 95.6 kg (10/18 0441) Last BM Date : 07/12/24  Weight change: Filed Weights   07/10/24 1536 07/11/24 0317 07/12/24 0441  Weight: 93.6 kg 94.7 kg 95.6 kg    Intake/Output:   Intake/Output Summary (Last 24 hours) at 07/12/2024 1144 Last data filed at 07/12/2024 1142 Gross per 24 hour  Intake 957.28 ml  Output 975 ml  Net -17.72 ml      Physical Exam    General: NAD Neck: JVP 16+, no thyromegaly or thyroid nodule.  Lungs: Clear to auscultation bilaterally with normal respiratory effort. CV: Nondisplaced PMI.  Heart regular S1/S2, no S3/S4, no murmur.  1+ edema to knees.  Abdomen: Soft, nontender, no hepatosplenomegaly, no distention.  Skin: Intact without lesions or rashes.  Neurologic: Alert and oriented x 3.  Psych: Normal affect. Extremities: No clubbing or cyanosis.  HEENT: Normal.    Telemetry   NSR 60s-70s (personally reviewed)   Labs    CBC Recent Labs    07/10/24 0227 07/10/24 0236 07/12/24 0500  WBC 8.6  --  8.9  NEUTROABS 6.8  --   --   HGB 11.8* 13.6  13.9 10.4*  HCT 37.2* 40.0  41.0 31.9*  MCV 94.4  --  91.9  PLT 149*  --  108*   Basic Metabolic Panel Recent Labs    89/82/74 0249  07/11/24 1530 07/12/24 0500  NA 133* 134* 133*  K 4.7 4.3 3.9  CL 102 101 100  CO2 19* 22 23  GLUCOSE 89 134* 117*  BUN 49* 52* 52*  CREATININE 2.41* 2.66* 2.48*  CALCIUM  9.9 9.8 9.5  MG 2.2  --  2.1   Liver Function Tests Recent Labs    07/10/24 0227  AST 31  ALT 35  ALKPHOS 89  BILITOT 3.3*  PROT 7.1  ALBUMIN  3.5   No results for input(s): LIPASE, AMYLASE in the last 72 hours. Cardiac Enzymes No results for input(s): CKTOTAL, CKMB, CKMBINDEX, TROPONINI in the last 72 hours.  BNP: BNP (last 3 results) Recent Labs    05/16/24 1308 06/25/24 1604 07/10/24 0227  BNP 618.2* 1,585.3* 1,952.1*    ProBNP (last 3 results) No results for input(s): PROBNP in the last 8760 hours.   D-Dimer No results for input(s): DDIMER in the last 72 hours. Hemoglobin A1C No results for input(s): HGBA1C in the last 72 hours. Fasting Lipid Panel No results for input(s): CHOL, HDL, LDLCALC, TRIG, CHOLHDL, LDLDIRECT in the last 72 hours. Thyroid Function Tests No results for input(s): TSH, T4TOTAL, T3FREE, THYROIDAB in the last 72 hours.  Invalid input(s): FREET3  Other results:  Imaging    No results found.    Medications:     Scheduled Medications:  amiodarone   200 mg Oral Daily   apixaban   5 mg Oral BID   atorvastatin   40 mg Oral Daily   Chlorhexidine  Gluconate Cloth  6 each Topical Daily   feeding supplement  237 mL Oral BID BM   ferrous sulfate   325 mg Oral Daily   metolazone   5 mg Oral Once   multivitamin with minerals  1 tablet Oral Daily   potassium chloride   40 mEq Oral Q4H   sodium chloride  flush  10-40 mL Intracatheter Q12H   sodium chloride  flush  3 mL Intravenous Q12H    Infusions:  furosemide  (LASIX ) 200 mg in dextrose  5 % 100 mL (2 mg/mL) infusion 20 mg/hr (07/12/24 0306)   milrinone  0.25 mcg/kg/min (07/12/24 0229)    PRN Medications: acetaminophen , melatonin, ondansetron  (ZOFRAN ) IV, polyethylene glycol,  sodium chloride  flush, sodium chloride  flush    Patient Profile   73 y/o male w/ chronic systolic heart failure d/t ICM, CAD, MR, PAF, H/o VT/VF s/p ICD and CKD IIIb admitted w/ a/c CHF and AKI.   Assessment/Plan   Chronic systolic heart failure: Primarily ischemic, prior history of infarcted RCA.   - Most recent 2D echo 8/25 EF 35-40%, RV mildly reduced, moderate-severe MR.  - NYHA IIIb w/ volume overload - Sluggish response to IV lasix  so far, PICC placed with elevated CVP and low co-ox.  He was started on milrinone , now on 0.25.  Co-ox 67% today.  Repeat lactate to make sure it has cleared.  - CVP remains 20.  I/Os not fully recorded.  Creatinine mildly lower at 2.48.  Will continue Lasix  20 mg/hr today (started last night) and will give metolazone  5 mg po x 1.  Replace K.  - CKD limits MRA and RAASi - given poor tolerance to Imdur /Hydralazine , will hold off on restarting for now  - No SGLT-2 with recent UTI - no ? blocker w/ concern for low output  - Continue UNNA boots - ICD in place. If continues w/ persistently low EF may benefit for CRT upgrade, QRS borderline at 135 ms    Mitral regurgitation: 2/2 ischemic disease.  TEE 9/25 with more moderate MR by PISA, valve area, systolic blunting of pulmonary veins. - HF optimization w/ GDMT and diuresis    AKI on CKD stage IIIb:  - Recent creatinine 1.8, but up to 2.5 on admission. Scr 2.41 => 2.66 => 2.48 today - Suspect cardiorenal - place unna boots and follow SCr w/ diuresis  - no SGLT2i given h/o UTIs   PAF - Previously diagnosed on device - observed runs of possible AT vs AFL while in the ED - SR today - Continue amio 200 mg daily  - Continue Eliquis  5 mg twice a day     Hx VT/VF - Has single chamber ICD - Continue amiodarone  200 mg daily   CAD: Single vessel RCA disease - No anginal pain - No aspirin  with need for anticoagulation. Continue statin   Abnormal Chest CT  - posterior right pulmonary nodule is new since  prior study + possible cirrhosis + hepatic lesions concerning for ? Metastatic dz, though liver cirrhosis may be 2/2 HF - differ further w/u to IM/PCP   Length of Stay: 2  Ezra Shuck, MD  07/12/2024, 11:44 AM  Advanced Heart Failure Team Pager [phone] (M-F; 7a - 5p)  Please contact [facility] for night-coverage after hours (5p -7a )  and weekends on [email or web address]

## 2024-07-12 NOTE — Plan of Care (Signed)
   Problem: Clinical Measurements: Goal: Ability to maintain clinical measurements within normal limits will improve Outcome: Progressing Goal: Will remain free from infection Outcome: Progressing

## 2024-07-13 DIAGNOSIS — I5020 Unspecified systolic (congestive) heart failure: Secondary | ICD-10-CM | POA: Diagnosis not present

## 2024-07-13 DIAGNOSIS — N179 Acute kidney failure, unspecified: Secondary | ICD-10-CM | POA: Diagnosis not present

## 2024-07-13 DIAGNOSIS — Z95810 Presence of automatic (implantable) cardiac defibrillator: Secondary | ICD-10-CM | POA: Diagnosis not present

## 2024-07-13 DIAGNOSIS — I5043 Acute on chronic combined systolic (congestive) and diastolic (congestive) heart failure: Secondary | ICD-10-CM | POA: Diagnosis not present

## 2024-07-13 DIAGNOSIS — I480 Paroxysmal atrial fibrillation: Secondary | ICD-10-CM | POA: Diagnosis not present

## 2024-07-13 LAB — BASIC METABOLIC PANEL WITH GFR
Anion gap: 10 (ref 5–15)
Anion gap: 10 (ref 5–15)
BUN: 57 mg/dL — ABNORMAL HIGH (ref 8–23)
BUN: 61 mg/dL — ABNORMAL HIGH (ref 8–23)
CO2: 23 mmol/L (ref 22–32)
CO2: 25 mmol/L (ref 22–32)
Calcium: 9.5 mg/dL (ref 8.9–10.3)
Calcium: 10.1 mg/dL (ref 8.9–10.3)
Chloride: 99 mmol/L (ref 98–111)
Chloride: 97 mmol/L — ABNORMAL LOW (ref 98–111)
Creatinine, Ser: 2.7 mg/dL — ABNORMAL HIGH (ref 0.61–1.24)
Creatinine, Ser: 2.62 mg/dL — ABNORMAL HIGH (ref 0.61–1.24)
GFR, Estimated: 24 mL/min — ABNORMAL LOW (ref >60)
GFR, Estimated: 25 mL/min — ABNORMAL LOW (ref >60)
Glucose, Bld: 128 mg/dL — ABNORMAL HIGH (ref 70–99)
Glucose, Bld: 135 mg/dL — ABNORMAL HIGH (ref 70–99)
Potassium: 4.2 mmol/L (ref 3.5–5.1)
Potassium: 4.5 mmol/L (ref 3.5–5.1)
Sodium: 132 mmol/L — ABNORMAL LOW (ref 135–145)
Sodium: 132 mmol/L — ABNORMAL LOW (ref 135–145)

## 2024-07-13 LAB — MAGNESIUM: Magnesium: 2.2 mg/dL (ref 1.7–2.4)

## 2024-07-13 LAB — COOXEMETRY PANEL
Carboxyhemoglobin: 2.4 % — ABNORMAL HIGH (ref 0.5–1.5)
Methemoglobin: <0.7 % (ref 0.0–1.5)
O2 Saturation: 67.8 %
Total hemoglobin: 10 g/dL — ABNORMAL LOW (ref 12.0–16.0)

## 2024-07-13 MED ORDER — METOLAZONE 5 MG PO TABS
5.0000 mg | ORAL_TABLET | Freq: Two times a day (BID) | ORAL | Status: AC
  Administered 2024-07-13 (×2): 5 mg via ORAL
  Filled 2024-07-13 (×2): qty 1

## 2024-07-13 MED ORDER — POTASSIUM CHLORIDE CRYS ER 20 MEQ PO TBCR
40.0000 meq | EXTENDED_RELEASE_TABLET | Freq: Once | ORAL | Status: AC
  Administered 2024-07-13: 40 meq via ORAL
  Filled 2024-07-13: qty 2

## 2024-07-13 MED ORDER — METOLAZONE 5 MG PO TABS: 5.0000 mg | ORAL_TABLET | Freq: Two times a day (BID) | ORAL | Status: DC

## 2024-07-13 NOTE — Progress Notes (Signed)
 Progress Note   Patient: Dean Pascucci Sr. FMW:[id number] DOB: [birth year] DOA: 07/10/2024  DOS: the patient was seen and examined on 07/13/2024   Brief hospital course:  73 y.o. male with medical history significant of HFrEF, VT s/p AICD, PAF, hyperlipidemia, CKD IV and T2DM presents with weakness and fluid retention, found to have exacerbation of HFrEF.   Assessment and Plan:   Acute exacerbation of HFrEF/moderate mitral regurgitation - Last noted EF 35-40% with grade 2 DD.  History of previous hospitalization requiring inotropic support for diuresis.  Cardiology/heart failure following closely.  PICC line and milrinone  drip.  Continues on Lasix  drip, metolazone .  GDMT limited by various intolerances.  Urine output appears improved although still not accurate.  Lactate normal however creatinine worse.  May pursue RHC early next week.  ICD in place.  May benefit from CRT.   Acute kidney injury on CKD 3B - Initially showing mild improvement, now worse than yesterday.  Creatinine up to 2.70.  Likely cardiorenal etiology.  Avoiding nephrotoxic agents.  Monitor urine output, will recheck BMP in AM.   Paroxysmal atrial fibrillation - Continue amiodarone  and Eliquis .  Currently NSR.   History of VT s/p ICD - Amiodarone  on board.  Telemetry.   Diabetes mellitus - Insulin  sliding scale on board.   Anemia of chronic disease - No active bleeding.  Hemoglobin stable.  Will continue to monitor.   Possible cirrhosis/liver lesions - Noted on CT, heterogenous liver with irregular contour suggesting cirrhosis.  Also noting lesions in the liver as well as a single RLL lung nodule.  Consider additional workup after resolution of above.   Physical debilitation muscle weakness - PT/OT on board.   Subjective: Patient resting comfortably this morning.  Denies any fever, chills, chest pain, nausea, vomiting, abdominal pain.  Physical Exam:  Vitals:   07/13/24 0419 07/13/24 0437 07/13/24 0710  07/13/24 1206  BP: (!) 121/50  132/63 (!) 126/45  Pulse: 78  74 79  Resp:   20 19  Temp: 98.4 F (36.9 C)  97.7 F (36.5 C) 97.7 F (36.5 C)  TempSrc: Oral  Oral Oral  SpO2: 96%  94% 98%  Weight:  94.6 kg    Height:        GENERAL:  Alert, pleasant, no acute distress  HEENT:  EOMI CARDIOVASCULAR:  RRR, systolic murmur appreciated RESPIRATORY:  Clear to auscultation, no wheezing, rales, or rhonchi GASTROINTESTINAL:  Soft, nontender, nondistended EXTREMITIES: Bandages over BL LE, 1-2+ BL LE pitting edema NEURO:  No new focal deficits appreciated SKIN:  No rashes noted PSYCH:  Appropriate mood and affect    Data Reviewed:  Imaging Studies: US  EKG SITE RITE Result Date: 07/11/2024 If Site Rite image not attached, placement could not be confirmed due to current cardiac rhythm.  CT Chest Wo Contrast Result Date: 07/10/2024 CLINICAL DATA:  Cough with respiratory illness. Right upper lobe opacity seen on chest x-ray earlier today. EXAM: CT CHEST WITHOUT CONTRAST TECHNIQUE: Multidetector CT imaging of the chest was performed following the standard protocol without IV contrast. RADIATION DOSE REDUCTION: This exam was performed according to the departmental dose-optimization program which includes automated exposure control, adjustment of the mA and/or kV according to patient size and/or use of iterative reconstruction technique. COMPARISON:  Chest x-ray earlier today.  Chest CT 09/08/2019 FINDINGS: Cardiovascular: The heart is enlarged. Coronary artery calcification is evident. Moderate atherosclerotic calcification is noted in the wall of the thoracic aorta. Left-sided pacer/AICD evident. Mediastinum/Nodes: Mild pre-vascular lymphadenopathy evident with 11 mm  short axis lymph node seen on 42/2. 12 mm short axis precarinal node on 52/2. No evidence for gross hilar lymphadenopathy although assessment is limited by the lack of intravenous contrast on the current study. The esophagus has normal  imaging features. Borderline lymphadenopathy in the right axilla. Lungs/Pleura: Biapical pleuroparenchymal scarring evident. Mosaic attenuation in the lungs bilaterally is nonspecific but may reflect air trapping from small airways disease. 7 mm posterior right pulmonary nodule on 59/4 is new since prior chest CT. 12 mm right lower lobe ground-glass opacity identified on 75/4. Subsegmental atelectasis noted in the left upper and lower lobes. Small bilateral pleural effusions. Upper Abdomen: Liver is heterogeneous with irregular contour suggesting cirrhosis. Comparing to abdomen CT of 05/04/2024, new subtle hypodense lesions are seen in the hepatic dome measuring 4.5 cm on image 116/2 and in the lateral segment left liver measuring 3.9 cm on image 119/2. There is probably a third 2.8 cm lesion in the caudate lobe on image 128/2. Calcified gallstones evident. New ascites is seen around the liver and the spleen. Musculoskeletal: No worrisome lytic or sclerotic osseous abnormality. IMPRESSION: 1. Heterogeneous liver with irregular contour suggesting cirrhosis. New subtle hypodense lesions in the hepatic dome and lateral segment left liver. There is probably a third lesion in the caudate lobe. Imaging features are concerning for metastatic disease. Dedicated CT abdomen and pelvis recommended with intravenous contrast material if renal function permits. 2. 7 mm posterior right pulmonary nodule is new since prior chest CT. 12 mm right lower lobe ground-glass opacity. Initial follow-up with CT at 6 months is recommended to confirm persistence. If persistent, repeat CT is recommended every 2 years until 5 years of stability has been established. This recommendation follows the consensus statement: Guidelines for Management of Incidental Pulmonary Nodules Detected on CT Images: From the Fleischner Society 2017; Radiology 2017; 284:228-243. 3. Mild mediastinal lymphadenopathy. 4. Mosaic attenuation in the lungs bilaterally is  nonspecific but may reflect air trapping from small airways disease. 5. Small bilateral pleural effusions. 6. New ascites around the liver and spleen. 7. Cholelithiasis. 8.  Aortic Atherosclerosis (ICD10-I70.0). Electronically Signed   By: Camellia Candle M.D.   On: 07/10/2024 07:12   DG Chest Port 1 View Result Date: 07/10/2024 EXAM: 1 VIEW(S) XRAY OF THE CHEST 07/10/2024 02:10:00 AM COMPARISON: 06/25/2024 CLINICAL HISTORY: DOE. Lethargic, leg swelling. FINDINGS: LINES, TUBES AND DEVICES: Left subclavian ICD. LUNGS AND PLEURA: Mild right basilar opacity, likely atelectasis. Faint patchy / nodular opacities in the right upper lobe, persistent, although possibly osseous. No pulmonary edema. No pleural effusion. No pneumothorax. HEART AND MEDIASTINUM: Cardiomegaly. Thoracic aortic atherosclerosis. BONES AND SOFT TISSUES: No acute osseous abnormality. IMPRESSION: 1. Cardiomegaly. No frank interstitial edema. 2. Faint patchy/nodular opacities in the right upper lobe, equivocal. Consider chest CT for further characterization if clinically warranted. 3. Mild right basilar opacity, likely atelectasis. Electronically signed by: Pinkie Pebbles MD 07/10/2024 02:18 AM EDT RP Workstation: [id number]   DG Chest Portable 1 View Result Date: 06/25/2024 CLINICAL DATA:  Increased shortness of breath and peripheral edema. EXAM: PORTABLE CHEST 1 VIEW COMPARISON:  05/07/2024 FINDINGS: Enlarged cardiac silhouette with an interval increase in size. Stable left subclavian AICD lead. Mild diffuse peribronchial thickening without significant change. Interval minimal patchy density in the right mid to upper lung zone. No acute bony abnormality. IMPRESSION: 1. Interval minimal patchy density in the right mid to upper lung zone, suspicious for pneumonia. 2. Mildly progressive cardiomegaly. 3. Stable mild bronchitic changes. Electronically Signed   By: Elspeth  Robynn M.D.   On: 06/25/2024 17:09    There are no new results to review at  this time.  Previous records (including but not limited to H&P, progress notes, nursing notes, TOC management) were reviewed in assessment of this patient.  Labs: CBC: Recent Labs  Lab 07/10/24 0227 07/10/24 0236 07/12/24 0500  WBC 8.6  --  8.9  NEUTROABS 6.8  --   --   HGB 11.8* 13.6  13.9 10.4*  HCT 37.2* 40.0  41.0 31.9*  MCV 94.4  --  91.9  PLT 149*  --  108*   Basic Metabolic Panel: Recent Labs  Lab 07/10/24 0227 07/10/24 0236 07/11/24 0249 07/11/24 1530 07/12/24 0500 07/13/24 0602  NA 134* 138  138 133* 134* 133* 132*  K 4.3 4.1  4.3 4.7 4.3 3.9 4.2  CL 104 105 102 101 100 99  CO2 19*  --  19* 22 23 23   GLUCOSE 103* 97 89 134* 117* 128*  BUN 50* 51* 49* 52* 52* 57*  CREATININE 2.49* 2.50* 2.41* 2.66* 2.48* 2.70*  CALCIUM  9.9  --  9.9 9.8 9.5 9.5  MG  --   --  2.2  --  2.1 2.2   Liver Function Tests: Recent Labs  Lab 07/10/24 0227  AST 31  ALT 35  ALKPHOS 89  BILITOT 3.3*  PROT 7.1  ALBUMIN  3.5   CBG: No results for input(s): GLUCAP in the last 168 hours.  Scheduled Meds:  amiodarone   200 mg Oral Daily   apixaban   5 mg Oral BID   atorvastatin   40 mg Oral Daily   Chlorhexidine  Gluconate Cloth  6 each Topical Daily   feeding supplement  237 mL Oral BID BM   ferrous sulfate   325 mg Oral Daily   metolazone   5 mg Oral BID   multivitamin with minerals  1 tablet Oral Daily   sodium chloride  flush  10-40 mL Intracatheter Q12H   sodium chloride  flush  3 mL Intravenous Q12H   Continuous Infusions:  furosemide  (LASIX ) 200 mg in dextrose  5 % 100 mL (2 mg/mL) infusion 20 mg/hr (07/13/24 1220)   milrinone  0.25 mcg/kg/min (07/13/24 1221)   PRN Meds:.acetaminophen , melatonin, ondansetron  (ZOFRAN ) IV, polyethylene glycol, sodium chloride  flush, sodium chloride  flush  Family Communication: None at bedside  Disposition: Status is: Inpatient Remains inpatient appropriate because: HFrEF     Time spent: 40 minutes  Length of inpatient stay: 3  days  Author: Carliss LELON Canales, DO 07/13/2024 1:22 PM  For on call review [email or web address].

## 2024-07-13 NOTE — Progress Notes (Signed)
 Patient ID: Dean Felch Sr., male   DOB: 10-31-1950, 73 y.o.   MRN: [id number]     Advanced Heart Failure Rounding Note  Cardiologist: Vinie JAYSON Maxcy, MD  HF Cardiologist: Dr. [name]  Chief Complaint: CHF  Subjective:    I/Os net negative 851 (not all UOP listed) and weight down 2 lbs.  However, creatinine up 2.48 => 2.7.  He is on milrinone  0.25, co-ox 68%.  BP remains stable.  Lasix  20 mg/hr, CVP still 22.   Says he feels better than when he arrived.   He is in NSR today on apixaban .    Objective:   Weight Range: 94.6 kg Body mass index is 33.67 kg/m.   Vital Signs:   Temp:  [97.3 F (36.3 C)-98.4 F (36.9 C)] 97.7 F (36.5 C) (10/19 0710) Pulse Rate:  [73-78] 74 (10/19 0710) Resp:  [17-20] 20 (10/19 0710) BP: (119-145)/(43-63) 132/63 (10/19 0710) SpO2:  [93 %-100 %] 94 % (10/19 0710) Weight:  [94.6 kg] 94.6 kg (10/19 0437) Last BM Date : 07/12/24  Weight change: Filed Weights   07/11/24 0317 07/12/24 0441 07/13/24 0437  Weight: 94.7 kg 95.6 kg 94.6 kg    Intake/Output:   Intake/Output Summary (Last 24 hours) at 07/13/2024 1023 Last data filed at 07/13/2024 0853 Gross per 24 hour  Intake 1333.81 ml  Output 2295 ml  Net -961.19 ml      Physical Exam    General: NAD Neck: JVP 16+, no thyromegaly or thyroid nodule.  Lungs: Clear to auscultation bilaterally with normal respiratory effort. CV: Nondisplaced PMI.  Heart regular S1/S2, no S3/S4, no murmur.  1+ edema to knees.   Abdomen: Soft, nontender, no hepatosplenomegaly, no distention.  Skin: Intact without lesions or rashes.  Neurologic: Alert and oriented x 3.  Psych: Normal affect. Extremities: No clubbing or cyanosis.  HEENT: Normal.   Telemetry   NSR 60s-70s (personally reviewed)   Labs    CBC Recent Labs    07/12/24 0500  WBC 8.9  HGB 10.4*  HCT 31.9*  MCV 91.9  PLT 108*   Basic Metabolic Panel Recent Labs    89/81/74 0500 07/13/24 0602  NA 133* 132*  K 3.9 4.2  CL 100  99  CO2 23 23  GLUCOSE 117* 128*  BUN 52* 57*  CREATININE 2.48* 2.70*  CALCIUM  9.5 9.5  MG 2.1 2.2   Liver Function Tests No results for input(s): AST, ALT, ALKPHOS, BILITOT, PROT, ALBUMIN  in the last 72 hours.  No results for input(s): LIPASE, AMYLASE in the last 72 hours. Cardiac Enzymes No results for input(s): CKTOTAL, CKMB, CKMBINDEX, TROPONINI in the last 72 hours.  BNP: BNP (last 3 results) Recent Labs    05/16/24 1308 06/25/24 1604 07/10/24 0227  BNP 618.2* 1,585.3* 1,952.1*    ProBNP (last 3 results) No results for input(s): PROBNP in the last 8760 hours.   D-Dimer No results for input(s): DDIMER in the last 72 hours. Hemoglobin A1C No results for input(s): HGBA1C in the last 72 hours. Fasting Lipid Panel No results for input(s): CHOL, HDL, LDLCALC, TRIG, CHOLHDL, LDLDIRECT in the last 72 hours. Thyroid Function Tests No results for input(s): TSH, T4TOTAL, T3FREE, THYROIDAB in the last 72 hours.  Invalid input(s): FREET3  Other results:   Imaging    No results found.    Medications:     Scheduled Medications:  amiodarone   200 mg Oral Daily   apixaban   5 mg Oral BID   atorvastatin   40 mg Oral  Daily   Chlorhexidine  Gluconate Cloth  6 each Topical Daily   feeding supplement  237 mL Oral BID BM   ferrous sulfate   325 mg Oral Daily   multivitamin with minerals  1 tablet Oral Daily   sodium chloride  flush  10-40 mL Intracatheter Q12H   sodium chloride  flush  3 mL Intravenous Q12H    Infusions:  furosemide  (LASIX ) 200 mg in dextrose  5 % 100 mL (2 mg/mL) infusion 20 mg/hr (07/13/24 0102)   milrinone  0.25 mcg/kg/min (07/12/24 1916)    PRN Medications: acetaminophen , melatonin, ondansetron  (ZOFRAN ) IV, polyethylene glycol, sodium chloride  flush, sodium chloride  flush    Patient Profile   73 y/o male w/ chronic systolic heart failure d/t ICM, CAD, MR, PAF, H/o VT/VF s/p ICD and CKD IIIb  admitted w/ a/c CHF and AKI.   Assessment/Plan   Chronic systolic heart failure: Primarily ischemic, prior history of infarcted RCA.   - Most recent 2D echo 8/25 EF 35-40%, RV mildly reduced, moderate-severe MR.  - NYHA IIIb w/ volume overload - Sluggish response to IV lasix , PICC placed with elevated CVP and low co-ox.  He was started on milrinone , now on 0.25.  Co-ox adequate at 68% today, lactate normal at 1.4.  - CVP remains 22.  Patient seems to have RV-predominant HF, I worry about worsening creatinine.  I/Os not fully recorded yesterday, weight down 2 lbs.  Creatinine 2.48 => 2.7.  Will continue Lasix  20 mg/hr today and will give metolazone  5 mg bid x 2 doses today with a dose of K and BMET in pm.  - CKD limits MRA and RAASi - given poor tolerance to Imdur /Hydralazine , will hold off on restarting for now  - No SGLT-2 with recent UTI - no ? blocker w/ concern for low output  - Continue UNNA boots - ICD in place.   Mitral regurgitation: 2/2 ischemic disease.  TEE 9/25 with more moderate MR by PISA, valve area, systolic blunting of pulmonary veins. - HF optimization w/ GDMT and diuresis    AKI on CKD stage IIIb:  - Recent creatinine 1.8, but up to 2.5 on admission. Scr 2.41 => 2.66 => 2.48 => 2.7 today - Suspect cardiorenal - place unna boots and follow SCr w/ diuresis  - no SGLT2i given h/o UTIs   PAF - Previously diagnosed on device - observed runs of possible AT vs AFL while in the ED - NSR today - Continue amio 200 mg daily  - Continue Eliquis  5 mg twice a day     Hx VT/VF - Has single chamber ICD - Continue amiodarone  200 mg daily   CAD: Single vessel RCA disease - No anginal pain - No aspirin  with need for anticoagulation. Continue statin   Abnormal Chest CT  - posterior right pulmonary nodule is new since prior study + possible cirrhosis + hepatic lesions concerning for ? Metastatic dz, though liver cirrhosis may be 2/2 HF - defer further w/u to IM/PCP   Length  of Stay: 3  Ezra Shuck, MD  07/13/2024, 10:23 AM  Advanced Heart Failure Team Pager [phone] (M-F; 7a - 5p)  Please contact [facility] for night-coverage after hours (5p -7a ) and weekends on [email or web address]

## 2024-07-13 NOTE — Progress Notes (Signed)
 Mobility Specialist Progress Note:    07/13/24 1245  Mobility  Activity Ambulated with assistance  Level of Assistance Standby assist, set-up cues, supervision of patient - no hands on  Assistive Device Front wheel walker  Distance Ambulated (ft) 200 ft  Activity Response Tolerated well  Mobility Referral Yes  Mobility visit 1 Mobility  Mobility Specialist Start Time (ACUTE ONLY) 1245  Mobility Specialist Stop Time (ACUTE ONLY) 1310  Mobility Specialist Time Calculation (min) (ACUTE ONLY) 25 min   Received pt laying in bed agreeable to session. No c/o any symptoms. Pt moving and ambulating decently well. Pt seemed he had moments where he seemed somewhat out of it but stated he was doing fine. Returned pt to recliner w/ all needs met and RN in room.  Venetia Keel Mobility Specialist Please Neurosurgeon or Rehab Office at [phone]

## 2024-07-13 NOTE — Plan of Care (Signed)

## 2024-07-14 ENCOUNTER — Encounter

## 2024-07-14 DIAGNOSIS — I5023 Acute on chronic systolic (congestive) heart failure: Secondary | ICD-10-CM

## 2024-07-14 DIAGNOSIS — N179 Acute kidney failure, unspecified: Secondary | ICD-10-CM | POA: Diagnosis not present

## 2024-07-14 DIAGNOSIS — E119 Type 2 diabetes mellitus without complications: Secondary | ICD-10-CM | POA: Diagnosis not present

## 2024-07-14 DIAGNOSIS — I5020 Unspecified systolic (congestive) heart failure: Secondary | ICD-10-CM | POA: Diagnosis not present

## 2024-07-14 DIAGNOSIS — I480 Paroxysmal atrial fibrillation: Secondary | ICD-10-CM | POA: Diagnosis not present

## 2024-07-14 LAB — BASIC METABOLIC PANEL WITH GFR
Anion gap: 9 (ref 5–15)
BUN: 64 mg/dL — ABNORMAL HIGH (ref 8–23)
CO2: 26 mmol/L (ref 22–32)
Calcium: 10.1 mg/dL (ref 8.9–10.3)
Chloride: 94 mmol/L — ABNORMAL LOW (ref 98–111)
Creatinine, Ser: 2.03 mg/dL — ABNORMAL HIGH (ref 0.61–1.24)
GFR, Estimated: 34 mL/min — ABNORMAL LOW (ref >60)
Glucose, Bld: 99 mg/dL (ref 70–99)
Potassium: 4 mmol/L (ref 3.5–5.1)
Sodium: 129 mmol/L — ABNORMAL LOW (ref 135–145)

## 2024-07-14 LAB — COOXEMETRY PANEL
Carboxyhemoglobin: 3.1 % — ABNORMAL HIGH (ref 0.5–1.5)
Methemoglobin: <0.7 % (ref 0.0–1.5)
O2 Saturation: 74.4 %
Total hemoglobin: 10.2 g/dL — ABNORMAL LOW (ref 12.0–16.0)

## 2024-07-14 LAB — MAGNESIUM: Magnesium: 2 mg/dL (ref 1.7–2.4)

## 2024-07-14 MED ORDER — METOLAZONE 5 MG PO TABS
5.0000 mg | ORAL_TABLET | Freq: Two times a day (BID) | ORAL | Status: AC
  Administered 2024-07-14 (×2): 5 mg via ORAL
  Filled 2024-07-14 (×2): qty 1

## 2024-07-14 MED ORDER — POTASSIUM CHLORIDE CRYS ER 20 MEQ PO TBCR
40.0000 meq | EXTENDED_RELEASE_TABLET | Freq: Once | ORAL | Status: AC
  Administered 2024-07-14: 40 meq via ORAL
  Filled 2024-07-14: qty 2

## 2024-07-14 NOTE — Plan of Care (Signed)

## 2024-07-14 NOTE — Progress Notes (Addendum)
 Patient ID: Woodley Petzold Sr., male   DOB: 16-Apr-1951, 73 y.o.   MRN: [id number]     Advanced Heart Failure Rounding Note  Cardiologist: Vinie JAYSON Maxcy, MD  HF Cardiologist: Dr. [name]  Chief Complaint: CHF  Subjective:    CO-OX 74% on 0.25 milrinone .  4L UOP charted last 24 hrs with lasix  gtt at 20 mg/hr + 5 mg metolazone  BID. Weight down 6 lb. CVP 13.  Feeling well this am. Pleased with increased UOP over the last day. Walked with mobility yesterday.   Objective:   Weight Range: 91.7 kg Body mass index is 32.62 kg/m.   Vital Signs:   Temp:  [97.4 F (36.3 C)-97.7 F (36.5 C)] 97.4 F (36.3 C) (10/20 0341) Pulse Rate:  [67-79] 72 (10/20 0341) Resp:  [18-20] 18 (10/20 0341) BP: (126-147)/(41-51) 147/51 (10/20 0341) SpO2:  [96 %-98 %] 97 % (10/20 0341) Weight:  [91.7 kg] 91.7 kg (10/20 0553) Last BM Date : 07/13/24  Weight change: Filed Weights   07/12/24 0441 07/13/24 0437 07/14/24 0553  Weight: 95.6 kg 94.6 kg 91.7 kg    Intake/Output:   Intake/Output Summary (Last 24 hours) at 07/14/2024 0721 Last data filed at 07/14/2024 0557 Gross per 24 hour  Intake 1440 ml  Output 4120 ml  Net -2680 ml      Physical Exam    General:  Elderly male. NAD. Neck: JVP 12-14 Cor: Regular rate & rhythm. No murmurs. Lungs: breathing nonlabored Abdomen: soft, nontender, nondistended.  Extremities: 2+ edema to thighs, + UNNA boots Neuro: alert & orientedx3. Affect pleasant   Telemetry   SR 60s-70s   Labs    CBC Recent Labs    07/12/24 0500  WBC 8.9  HGB 10.4*  HCT 31.9*  MCV 91.9  PLT 108*   Basic Metabolic Panel Recent Labs    89/80/74 0602 07/13/24 1500 07/14/24 0543  NA 132* 132* 129*  K 4.2 4.5 4.0  CL 99 97* 94*  CO2 23 25 26   GLUCOSE 128* 135* 99  BUN 57* 61* 64*  CREATININE 2.70* 2.62* 2.03*  CALCIUM  9.5 10.1 10.1  MG 2.2  --  2.0   Liver Function Tests No results for input(s): AST, ALT, ALKPHOS, BILITOT, PROT, ALBUMIN   in the last 72 hours.  No results for input(s): LIPASE, AMYLASE in the last 72 hours. Cardiac Enzymes No results for input(s): CKTOTAL, CKMB, CKMBINDEX, TROPONINI in the last 72 hours.  BNP: BNP (last 3 results) Recent Labs    05/16/24 1308 06/25/24 1604 07/10/24 0227  BNP 618.2* 1,585.3* 1,952.1*    ProBNP (last 3 results) No results for input(s): PROBNP in the last 8760 hours.   D-Dimer No results for input(s): DDIMER in the last 72 hours. Hemoglobin A1C No results for input(s): HGBA1C in the last 72 hours. Fasting Lipid Panel No results for input(s): CHOL, HDL, LDLCALC, TRIG, CHOLHDL, LDLDIRECT in the last 72 hours. Thyroid Function Tests No results for input(s): TSH, T4TOTAL, T3FREE, THYROIDAB in the last 72 hours.  Invalid input(s): FREET3  Other results:   Imaging    No results found.    Medications:     Scheduled Medications:  amiodarone   200 mg Oral Daily   apixaban   5 mg Oral BID   atorvastatin   40 mg Oral Daily   Chlorhexidine  Gluconate Cloth  6 each Topical Daily   feeding supplement  237 mL Oral BID BM   ferrous sulfate   325 mg Oral Daily   multivitamin with minerals  1 tablet Oral Daily   sodium chloride  flush  10-40 mL Intracatheter Q12H   sodium chloride  flush  3 mL Intravenous Q12H    Infusions:  furosemide  (LASIX ) 200 mg in dextrose  5 % 100 mL (2 mg/mL) infusion 20 mg/hr (07/13/24 2257)   milrinone  0.25 mcg/kg/min (07/14/24 0337)    PRN Medications: acetaminophen , melatonin, ondansetron  (ZOFRAN ) IV, polyethylene glycol, sodium chloride  flush, sodium chloride  flush    Patient Profile   73 y/o male w/ chronic systolic heart failure d/t ICM, CAD, MR, PAF, H/o VT/VF s/p ICD and CKD IIIb admitted w/ a/c CHF and AKI.   Assessment/Plan   Chronic systolic heart failure: Primarily ischemic, prior history of infarcted RCA.   - Most recent 2D echo 8/25 EF 35-40%, RV mildly reduced, moderate-severe  MR.  - NYHA IIIb w/ volume overload - Sluggish response to IV lasix , PICC placed with elevated CVP and low co-ox.  He was started on milrinone , now on 0.25.   - CO-OX 74% on 0.25 milrinone  - Diuresed well yesterday with lasix  gtt at 20/hr + 5 mg metolazone  BID, will continue today.  - CKD limits MRA and RAASi - given poor tolerance to Imdur /Hydralazine , will hold off on restarting for now  - No SGLT-2 with recent UTI - no ? blocker w/ concern for low output  - Continue UNNA boots - ICD in place.   Mitral regurgitation: 2/2 ischemic disease.  TEE 9/25 with more moderate MR by PISA, valve area, systolic blunting of pulmonary veins. - HF optimization w/ GDMT and diuresis    AKI on CKD stage IIIb:  - Recent creatinine 1.8, but up to 2.5 on admission. Scr trended up to 2.7, now improving with inotrope support and diuresis. Scr 2.0 this am - Suspect cardiorenal - no SGLT2i given h/o UTIs   PAF - Previously diagnosed on device - observed runs of possible AT vs AFL while in the ED - NSR today - Continue amio 200 mg daily  - Continue Eliquis  5 mg twice a day     Hx VT/VF - Has single chamber ICD - Continue amiodarone  200 mg daily   CAD: Single vessel RCA disease - No anginal pain - No aspirin  with need for anticoagulation. Continue statin   Abnormal Chest CT  - posterior right pulmonary nodule is new since prior study + possible cirrhosis + hepatic lesions concerning for ? Metastatic dz, though liver cirrhosis may be 2/2 HF - defer further w/u to IM/PCP   Length of Stay: 4  FINCH, LINDSAY N, PA-C  07/14/2024, 7:21 AM  Advanced Heart Failure Team Pager [phone] (M-F; 7a - 5p)  Please contact [facility] for night-coverage after hours (5p -7a ) and weekends on [email or web address]  Patient seen and examined with the above-signed Advanced Practice Provider and/or Housestaff. I personally reviewed laboratory data, imaging studies and relevant notes. I independently examined the patient  and formulated the important aspects of the plan. I have edited the note to reflect any of my changes or salient points. I have personally discussed the plan with the patient and/or family.  Remains on milrinone  0.25 Co-ox 74%. Finally starting to diurese on high-dose diuretics.   CVP 13. Scr improved 2.7-> 2.0   General:  Lying in bed. No resp difficulty HEENT: normal Neck: supple. Jvp to jaw prominent v waves . Carotids 2+ bilat; no bruits. No lymphadenopathy or thryomegaly appreciated. Cor: PMI nondisplaced. Regular rate & rhythm. No rubs, gallops or murmurs. Lungs: clear Abdomen: soft, nontender, nondistended.  No hepatosplenomegaly. No bruits or masses. Good bowel sounds. Extremities: no cyanosis, clubbing, rash, 2+ edema Neuro: alert & orientedx3, cranial nerves grossly intact. moves all 4 extremities w/o difficulty. Affect pleasant  Continue milrinone  and IV diuresis one more day. Once fully diuresed will wean milrinone .  Trajectory is concerning with recurrent need for inotropic support.   [name], MD  9:57 AM

## 2024-07-14 NOTE — Progress Notes (Signed)
 No ICM remote transmission received for 07/14/2024 due to currently hospitalized and next ICM transmission rescheduled for 07/28/2024.

## 2024-07-14 NOTE — Plan of Care (Signed)
   Problem: Education: Goal: Knowledge of General Education information will improve Description: Including pain rating scale, medication(s)/side effects and non-pharmacologic comfort measures Outcome: Progressing   Problem: Clinical Measurements: Goal: Diagnostic test results will improve Outcome: Progressing Goal: Respiratory complications will improve Outcome: Progressing   Problem: Nutrition: Goal: Adequate nutrition will be maintained Outcome: Progressing

## 2024-07-14 NOTE — Progress Notes (Signed)
 Progress Note   Patient: Dean [name]. FMW:[id number] DOB: 05-16-1951 DOA: 07/10/2024  DOS: the patient was seen and examined on 07/14/2024   Brief hospital course:  73 y.o. male with medical history significant of HFrEF, VT s/p AICD, PAF, hyperlipidemia, CKD IV and T2DM presents with weakness and fluid retention, found to have exacerbation of HFrEF.   Assessment and Plan:   Acute exacerbation of HFrEF/moderate mitral regurgitation - Last noted EF 35-40% with grade 2 DD.  History of previous hospitalization requiring inotropic support for diuresis.  Cardiology/heart failure following closely.  PICC line and milrinone  drip.  Continues on Lasix  drip, metolazone .  GDMT limited by various intolerances.  Urine output appears markedly improved from yesterday, around 4 L urine output.  Cardiology continuing current regimen for now.  Acute kidney injury on CKD 3B - Showing improvement from yesterday, creatinine down to 2.0.  Likely cardiorenal etiology.  Avoiding nephrotoxic agents.  Monitor urine output, will recheck BMP in AM.   Paroxysmal atrial fibrillation - Continue amiodarone  and Eliquis .  Currently NSR.   History of VT s/p ICD - Amiodarone  on board.  Telemetry.   Diabetes mellitus - Insulin  sliding scale on board.   Anemia of chronic disease - No active bleeding.  Hemoglobin stable.  Will continue to monitor.   Possible cirrhosis/liver lesions - Noted on CT, heterogenous liver with irregular contour suggesting cirrhosis.  Also noting lesions in the liver as well as a single RLL lung nodule.  Consider additional workup after resolution of above.   Physical debilitation muscle weakness - PT/OT on board.   Subjective: Patient resting, this morning, states he feels much better.  Urine output markedly improved since yesterday.  Denies any fever, chills, chest pain, nausea, vomiting, abdominal pain.  Complaining of his leg wraps being too tight.  Physical Exam:  Vitals:    07/14/24 0341 07/14/24 0553 07/14/24 0809 07/14/24 1110  BP: (!) 147/51  (!) 123/47 (!) 130/55  Pulse: 72  76 74  Resp: 18  20 20   Temp: (!) 97.4 F (36.3 C)  97.6 F (36.4 C) 98.2 F (36.8 C)  TempSrc: Oral  Oral Oral  SpO2: 97%  100% 100%  Weight:  91.7 kg    Height:        GENERAL:  Alert, pleasant, no acute distress  HEENT:  EOMI CARDIOVASCULAR:  RRR, systolic murmur appreciated RESPIRATORY:  Clear to auscultation, no wheezing, rales, or rhonchi GASTROINTESTINAL:  Soft, nontender, nondistended EXTREMITIES: Bandages over BL LE, 1+ BL LE pitting edema NEURO:  No new focal deficits appreciated SKIN:  No rashes noted PSYCH:  Appropriate mood and affect    Data Reviewed:  Imaging Studies: US  EKG SITE RITE Result Date: 07/11/2024 If Site Rite image not attached, placement could not be confirmed due to current cardiac rhythm.  CT Chest Wo Contrast Result Date: 07/10/2024 CLINICAL DATA:  Cough with respiratory illness. Right upper lobe opacity seen on chest x-ray earlier today. EXAM: CT CHEST WITHOUT CONTRAST TECHNIQUE: Multidetector CT imaging of the chest was performed following the standard protocol without IV contrast. RADIATION DOSE REDUCTION: This exam was performed according to the departmental dose-optimization program which includes automated exposure control, adjustment of the mA and/or kV according to patient size and/or use of iterative reconstruction technique. COMPARISON:  Chest x-ray earlier today.  Chest CT 09/08/2019 FINDINGS: Cardiovascular: The heart is enlarged. Coronary artery calcification is evident. Moderate atherosclerotic calcification is noted in the wall of the thoracic aorta. Left-sided pacer/AICD evident. Mediastinum/Nodes: Mild pre-vascular  lymphadenopathy evident with 11 mm short axis lymph node seen on 42/2. 12 mm short axis precarinal node on 52/2. No evidence for gross hilar lymphadenopathy although assessment is limited by the lack of intravenous  contrast on the current study. The esophagus has normal imaging features. Borderline lymphadenopathy in the right axilla. Lungs/Pleura: Biapical pleuroparenchymal scarring evident. Mosaic attenuation in the lungs bilaterally is nonspecific but may reflect air trapping from small airways disease. 7 mm posterior right pulmonary nodule on 59/4 is new since prior chest CT. 12 mm right lower lobe ground-glass opacity identified on 75/4. Subsegmental atelectasis noted in the left upper and lower lobes. Small bilateral pleural effusions. Upper Abdomen: Liver is heterogeneous with irregular contour suggesting cirrhosis. Comparing to abdomen CT of 05/04/2024, new subtle hypodense lesions are seen in the hepatic dome measuring 4.5 cm on image 116/2 and in the lateral segment left liver measuring 3.9 cm on image 119/2. There is probably a third 2.8 cm lesion in the caudate lobe on image 128/2. Calcified gallstones evident. New ascites is seen around the liver and the spleen. Musculoskeletal: No worrisome lytic or sclerotic osseous abnormality. IMPRESSION: 1. Heterogeneous liver with irregular contour suggesting cirrhosis. New subtle hypodense lesions in the hepatic dome and lateral segment left liver. There is probably a third lesion in the caudate lobe. Imaging features are concerning for metastatic disease. Dedicated CT abdomen and pelvis recommended with intravenous contrast material if renal function permits. 2. 7 mm posterior right pulmonary nodule is new since prior chest CT. 12 mm right lower lobe ground-glass opacity. Initial follow-up with CT at 6 months is recommended to confirm persistence. If persistent, repeat CT is recommended every 2 years until 5 years of stability has been established. This recommendation follows the consensus statement: Guidelines for Management of Incidental Pulmonary Nodules Detected on CT Images: From the Fleischner Society 2017; Radiology 2017; 284:228-243. 3. Mild mediastinal  lymphadenopathy. 4. Mosaic attenuation in the lungs bilaterally is nonspecific but may reflect air trapping from small airways disease. 5. Small bilateral pleural effusions. 6. New ascites around the liver and spleen. 7. Cholelithiasis. 8.  Aortic Atherosclerosis (ICD10-I70.0). Electronically Signed   By: Camellia Candle M.D.   On: 07/10/2024 07:12   DG Chest Port 1 View Result Date: 07/10/2024 EXAM: 1 VIEW(S) XRAY OF THE CHEST 07/10/2024 02:10:00 AM COMPARISON: 06/25/2024 CLINICAL HISTORY: DOE. Lethargic, leg swelling. FINDINGS: LINES, TUBES AND DEVICES: Left subclavian ICD. LUNGS AND PLEURA: Mild right basilar opacity, likely atelectasis. Faint patchy / nodular opacities in the right upper lobe, persistent, although possibly osseous. No pulmonary edema. No pleural effusion. No pneumothorax. HEART AND MEDIASTINUM: Cardiomegaly. Thoracic aortic atherosclerosis. BONES AND SOFT TISSUES: No acute osseous abnormality. IMPRESSION: 1. Cardiomegaly. No frank interstitial edema. 2. Faint patchy/nodular opacities in the right upper lobe, equivocal. Consider chest CT for further characterization if clinically warranted. 3. Mild right basilar opacity, likely atelectasis. Electronically signed by: Pinkie Pebbles MD 07/10/2024 02:18 AM EDT RP Workstation: [id number]   DG Chest Portable 1 View Result Date: 06/25/2024 CLINICAL DATA:  Increased shortness of breath and peripheral edema. EXAM: PORTABLE CHEST 1 VIEW COMPARISON:  05/07/2024 FINDINGS: Enlarged cardiac silhouette with an interval increase in size. Stable left subclavian AICD lead. Mild diffuse peribronchial thickening without significant change. Interval minimal patchy density in the right mid to upper lung zone. No acute bony abnormality. IMPRESSION: 1. Interval minimal patchy density in the right mid to upper lung zone, suspicious for pneumonia. 2. Mildly progressive cardiomegaly. 3. Stable mild bronchitic changes. Electronically Signed  By: Elspeth Bathe M.D.    On: 06/25/2024 17:09    There are no new results to review at this time.  Previous records (including but not limited to H&P, progress notes, nursing notes, TOC management) were reviewed in assessment of this patient.  Labs: CBC: Recent Labs  Lab 07/10/24 0227 07/10/24 0236 07/12/24 0500  WBC 8.6  --  8.9  NEUTROABS 6.8  --   --   HGB 11.8* 13.6  13.9 10.4*  HCT 37.2* 40.0  41.0 31.9*  MCV 94.4  --  91.9  PLT 149*  --  108*   Basic Metabolic Panel: Recent Labs  Lab 07/11/24 0249 07/11/24 1530 07/12/24 0500 07/13/24 0602 07/13/24 1500 07/14/24 0543  NA 133* 134* 133* 132* 132* 129*  K 4.7 4.3 3.9 4.2 4.5 4.0  CL 102 101 100 99 97* 94*  CO2 19* 22 23 23 25 26   GLUCOSE 89 134* 117* 128* 135* 99  BUN 49* 52* 52* 57* 61* 64*  CREATININE 2.41* 2.66* 2.48* 2.70* 2.62* 2.03*  CALCIUM  9.9 9.8 9.5 9.5 10.1 10.1  MG 2.2  --  2.1 2.2  --  2.0   Liver Function Tests: Recent Labs  Lab 07/10/24 0227  AST 31  ALT 35  ALKPHOS 89  BILITOT 3.3*  PROT 7.1  ALBUMIN  3.5   CBG: No results for input(s): GLUCAP in the last 168 hours.  Scheduled Meds:  amiodarone   200 mg Oral Daily   apixaban   5 mg Oral BID   atorvastatin   40 mg Oral Daily   Chlorhexidine  Gluconate Cloth  6 each Topical Daily   feeding supplement  237 mL Oral BID BM   ferrous sulfate   325 mg Oral Daily   metolazone   5 mg Oral BID   multivitamin with minerals  1 tablet Oral Daily   sodium chloride  flush  10-40 mL Intracatheter Q12H   sodium chloride  flush  3 mL Intravenous Q12H   Continuous Infusions:  furosemide  (LASIX ) 200 mg in dextrose  5 % 100 mL (2 mg/mL) infusion 20 mg/hr (07/14/24 0918)   milrinone  0.25 mcg/kg/min (07/14/24 0337)   PRN Meds:.acetaminophen , melatonin, ondansetron  (ZOFRAN ) IV, polyethylene glycol, sodium chloride  flush, sodium chloride  flush  Family Communication: None at bedside  Disposition: Status is: Inpatient Remains inpatient appropriate because: HFrEF     Time  spent: 39 minutes  Length of inpatient stay: 4 days  Author: Carliss LELON Canales, DO 07/14/2024 1:20 PM  For on call review [email or web address].

## 2024-07-15 DIAGNOSIS — N179 Acute kidney failure, unspecified: Secondary | ICD-10-CM | POA: Diagnosis not present

## 2024-07-15 DIAGNOSIS — Z95810 Presence of automatic (implantable) cardiac defibrillator: Secondary | ICD-10-CM | POA: Diagnosis not present

## 2024-07-15 DIAGNOSIS — I480 Paroxysmal atrial fibrillation: Secondary | ICD-10-CM | POA: Diagnosis not present

## 2024-07-15 DIAGNOSIS — I5020 Unspecified systolic (congestive) heart failure: Secondary | ICD-10-CM | POA: Diagnosis not present

## 2024-07-15 DIAGNOSIS — I5022 Chronic systolic (congestive) heart failure: Secondary | ICD-10-CM | POA: Diagnosis not present

## 2024-07-15 LAB — BASIC METABOLIC PANEL WITH GFR
Anion gap: 9 (ref 5–15)
BUN: 69 mg/dL — ABNORMAL HIGH (ref 8–23)
CO2: 30 mmol/L (ref 22–32)
Calcium: 10.2 mg/dL (ref 8.9–10.3)
Chloride: 90 mmol/L — ABNORMAL LOW (ref 98–111)
Creatinine, Ser: 2.5 mg/dL — ABNORMAL HIGH (ref 0.61–1.24)
GFR, Estimated: 26 mL/min — ABNORMAL LOW
Glucose, Bld: 96 mg/dL (ref 70–99)
Potassium: 3.6 mmol/L (ref 3.5–5.1)
Sodium: 129 mmol/L — ABNORMAL LOW (ref 135–145)

## 2024-07-15 LAB — COOXEMETRY PANEL
Carboxyhemoglobin: 2.4 % — ABNORMAL HIGH (ref 0.5–1.5)
Methemoglobin: <0.7 % (ref 0.0–1.5)
O2 Saturation: 66.8 %
Total hemoglobin: 10.3 g/dL — ABNORMAL LOW (ref 12.0–16.0)

## 2024-07-15 LAB — MAGNESIUM: Magnesium: 2.1 mg/dL (ref 1.7–2.4)

## 2024-07-15 MED ORDER — POTASSIUM CHLORIDE CRYS ER 20 MEQ PO TBCR
20.0000 meq | EXTENDED_RELEASE_TABLET | Freq: Once | ORAL | Status: AC
  Administered 2024-07-15: 20 meq via ORAL
  Filled 2024-07-15: qty 1

## 2024-07-15 MED ORDER — POTASSIUM CHLORIDE CRYS ER 20 MEQ PO TBCR: 40.0000 meq | EXTENDED_RELEASE_TABLET | Freq: Once | ORAL | Status: DC

## 2024-07-15 MED ORDER — METOLAZONE 5 MG PO TABS
5.0000 mg | ORAL_TABLET | Freq: Two times a day (BID) | ORAL | Status: AC
  Administered 2024-07-15 (×2): 5 mg via ORAL
  Filled 2024-07-15 (×2): qty 1

## 2024-07-15 NOTE — Progress Notes (Signed)
 Physical Therapy Treatment Patient Details Name: Dean Yeagle Sr. MRN: [id number] DOB: [birth year] Today's Date: 07/15/2024   History of Present Illness 73 y.o. M adm 07/10/24 with weakness and fluid retention. PMHx: HLD, HFrEF, ICD, PAF on Eliquis , HTN, CAD, HLD, OSA, T2DM, CKD, HTN, laminectomy    PT Comments  Pt pleasant despite stating he feels drowsy from lack of sleep this date. Pt able to increase gait tolerance and perform stairs this session with SPO2>95% throughout and HR max 97. Pt with continued weakness and decreased activity tolerance from baseline with LB edema and educated for HEP and continued activity progression. HHPT recommended.     If plan is discharge home, recommend the following: A little help with walking and/or transfers;A little help with bathing/dressing/bathroom;Assistance with cooking/housework   Can travel by private vehicle        Equipment Recommendations  None recommended by PT    Recommendations for Other Services       Precautions / Restrictions Precautions Precautions: Fall Recall of Precautions/Restrictions: Intact     Mobility  Bed Mobility Overal bed mobility: Modified Independent             General bed mobility comments: HOB 20 degrees with rail    Transfers Overall transfer level: Modified independent                 General transfer comment: pt able to rise from bed and chair with reliance on UB. Pt performed 5 repeated sit to stands with UB in 20 sec without consistently lowering fully to surface    Ambulation/Gait Ambulation/Gait assistance: Supervision Gait Distance (Feet): 140 Feet Assistive device: Rolling walker (2 wheels) Gait Pattern/deviations: Step-through pattern, Decreased stride length, Trunk flexed   Gait velocity interpretation: <1.8 ft/sec, indicate of risk for recurrent falls   General Gait Details: cues for posture, decreased speed with HR 92-97. Pt walked 140', performed stairs, seated rest  then, additional 90' after seated rest   Stairs Stairs: Yes Stairs assistance: Modified independent (Device/Increase time) Stair Management: Alternating pattern, Forwards, Two rails Number of Stairs: 8     Wheelchair Mobility     Tilt Bed    Modified Rankin (Stroke Patients Only)       Balance Overall balance assessment: Mild deficits observed, not formally tested                                          Communication Communication Communication: No apparent difficulties  Cognition Arousal: Alert Behavior During Therapy: Flat affect   PT - Cognitive impairments: No apparent impairments                         Following commands: Intact      Cueing Cueing Techniques: Verbal cues  Exercises General Exercises - Lower Extremity Long Arc Quad: AROM, Both, 15 reps, Seated, Strengthening Hip Flexion/Marching: AROM, Both, Seated, Strengthening, 15 reps    General Comments        Pertinent Vitals/Pain Pain Assessment Pain Assessment: No/denies pain    Home Living                          Prior Function            PT Goals (current goals can now be found in the care plan section) Progress towards PT goals: Progressing  toward goals    Frequency    Min 2X/week      PT Plan      Co-evaluation              AM-PAC PT 6 Clicks Mobility   Outcome Measure  Help needed turning from your back to your side while in a flat bed without using bedrails?: A Little Help needed moving from lying on your back to sitting on the side of a flat bed without using bedrails?: A Little Help needed moving to and from a bed to a chair (including a wheelchair)?: A Little Help needed standing up from a chair using your arms (e.g., wheelchair or bedside chair)?: A Little Help needed to walk in hospital room?: A Little Help needed climbing 3-5 steps with a railing? : A Little 6 Click Score: 18    End of Session   Activity  Tolerance: Patient tolerated treatment well Patient left: in chair;with call bell/phone within reach;with chair alarm set Nurse Communication: Mobility status PT Visit Diagnosis: Other abnormalities of gait and mobility (R26.89);Difficulty in walking, not elsewhere classified (R26.2);Muscle weakness (generalized) (M62.81)     Time: 9046-8977 PT Time Calculation (min) (ACUTE ONLY): 29 min  Charges:    $Gait Training: 8-22 mins $Therapeutic Exercise: 8-22 mins PT General Charges $$ ACUTE PT VISIT: 1 Visit                     Lenoard SQUIBB, PT Acute Rehabilitation Services Office: [phone]    Calena Salem B Jacilyn Sanpedro 07/15/2024, 11:12 AM

## 2024-07-15 NOTE — Progress Notes (Signed)
 Orthopedic Tech Progress Note Patient Details:  Dean Vantol Sr. [birth year] [id number]  Ortho Devices Type of Ortho Device: Ace wrap, Unna boot Ortho Device/Splint Location: BLE Ortho Device/Splint Interventions: Ordered, Application, Adjustment   Post Interventions Patient Tolerated: Well Instructions Provided: Care of device  Delanna LITTIE Pac 07/15/2024, 3:08 PM

## 2024-07-15 NOTE — Progress Notes (Signed)
 Progress Note   Patient: Dean [name]. FMW:[id number] DOB: 1951-04-17 DOA: 07/10/2024  DOS: the patient was seen and examined on 07/15/2024   Brief hospital course:  73 y.o. male with medical history significant of HFrEF, VT s/p AICD, PAF, hyperlipidemia, CKD IV and T2DM presents with weakness and fluid retention, found to have exacerbation of HFrEF.   Assessment and Plan:   Acute exacerbation of HFrEF/moderate mitral regurgitation - Last noted EF 35-40% with grade 2 DD.  History of previous hospitalization requiring inotropic support for diuresis.  Cardiology/heart failure following closely.  PICC line and milrinone  drip.  Continues on Lasix  drip, metolazone .  GDMT limited by various intolerances.  Urine output continues to be excellent, another 4 L urine output (net -7.3 L).  Diuretic regimen per advanced heart failure/cardiology.   Acute kidney injury on CKD 3B - Showing slightly worse than yesterday, 2.50.  Bicarb up a little bit suggesting possibly becoming intravascularly dry.  Underlying cardiorenal etiology.  Avoiding nephrotoxic agents.  Monitor urine output, will recheck BMP in AM.   Paroxysmal atrial fibrillation - Continue amiodarone  and Eliquis .  Currently NSR.   History of VT s/p ICD - Amiodarone  on board.  Telemetry.   Diabetes mellitus - Insulin  sliding scale on board.   Anemia of chronic disease - No active bleeding.  Hemoglobin stable.  Will continue to monitor.   Possible cirrhosis/liver lesions - Noted on CT, heterogenous liver with irregular contour suggesting cirrhosis.  Also noting lesions in the liver as well as a single RLL lung nodule.  Consider additional workup after resolution of above.   Physical debilitation muscle weakness - PT/OT on board.  Recommending home health.   Subjective: Patient resting comfortably this morning.  In good spirits.  Was able to have his lower extremity wraps removed for a bit providing some relief.  Denies any  worsening shortness of breath, purulent sputum, cough, chest pain, nausea, vomiting, abdominal pain.  Physical Exam:  Vitals:   07/15/24 0326 07/15/24 0439 07/15/24 0745 07/15/24 1135  BP: (!) 144/53  (!) 136/42 (!) 126/43  Pulse: 71  76 70  Resp: (!) 23  (!) 27 18  Temp: 98.3 F (36.8 C) 98.3 F (36.8 C) 98.2 F (36.8 C)   TempSrc: Oral Oral Oral   SpO2: 99%  98% 98%  Weight: 89 kg     Height:        GENERAL:  Alert, pleasant, no acute distress  HEENT:  EOMI CARDIOVASCULAR:  RRR, systolic murmur appreciated RESPIRATORY:  Clear to auscultation, no wheezing, rales, or rhonchi GASTROINTESTINAL:  Soft, nontender, nondistended EXTREMITIES: 2+ BL LE pitting edema NEURO:  No new focal deficits appreciated SKIN:  No rashes noted PSYCH:  Appropriate mood and affect    Data Reviewed:  Imaging Studies: US  EKG SITE RITE Result Date: 07/11/2024 If Site Rite image not attached, placement could not be confirmed due to current cardiac rhythm.  CT Chest Wo Contrast Result Date: 07/10/2024 CLINICAL DATA:  Cough with respiratory illness. Right upper lobe opacity seen on chest x-ray earlier today. EXAM: CT CHEST WITHOUT CONTRAST TECHNIQUE: Multidetector CT imaging of the chest was performed following the standard protocol without IV contrast. RADIATION DOSE REDUCTION: This exam was performed according to the departmental dose-optimization program which includes automated exposure control, adjustment of the mA and/or kV according to patient size and/or use of iterative reconstruction technique. COMPARISON:  Chest x-ray earlier today.  Chest CT 09/08/2019 FINDINGS: Cardiovascular: The heart is enlarged. Coronary artery calcification is evident.  Moderate atherosclerotic calcification is noted in the wall of the thoracic aorta. Left-sided pacer/AICD evident. Mediastinum/Nodes: Mild pre-vascular lymphadenopathy evident with 11 mm short axis lymph node seen on 42/2. 12 mm short axis precarinal node on  52/2. No evidence for gross hilar lymphadenopathy although assessment is limited by the lack of intravenous contrast on the current study. The esophagus has normal imaging features. Borderline lymphadenopathy in the right axilla. Lungs/Pleura: Biapical pleuroparenchymal scarring evident. Mosaic attenuation in the lungs bilaterally is nonspecific but may reflect air trapping from small airways disease. 7 mm posterior right pulmonary nodule on 59/4 is new since prior chest CT. 12 mm right lower lobe ground-glass opacity identified on 75/4. Subsegmental atelectasis noted in the left upper and lower lobes. Small bilateral pleural effusions. Upper Abdomen: Liver is heterogeneous with irregular contour suggesting cirrhosis. Comparing to abdomen CT of 05/04/2024, new subtle hypodense lesions are seen in the hepatic dome measuring 4.5 cm on image 116/2 and in the lateral segment left liver measuring 3.9 cm on image 119/2. There is probably a third 2.8 cm lesion in the caudate lobe on image 128/2. Calcified gallstones evident. New ascites is seen around the liver and the spleen. Musculoskeletal: No worrisome lytic or sclerotic osseous abnormality. IMPRESSION: 1. Heterogeneous liver with irregular contour suggesting cirrhosis. New subtle hypodense lesions in the hepatic dome and lateral segment left liver. There is probably a third lesion in the caudate lobe. Imaging features are concerning for metastatic disease. Dedicated CT abdomen and pelvis recommended with intravenous contrast material if renal function permits. 2. 7 mm posterior right pulmonary nodule is new since prior chest CT. 12 mm right lower lobe ground-glass opacity. Initial follow-up with CT at 6 months is recommended to confirm persistence. If persistent, repeat CT is recommended every 2 years until 5 years of stability has been established. This recommendation follows the consensus statement: Guidelines for Management of Incidental Pulmonary Nodules Detected  on CT Images: From the Fleischner Society 2017; Radiology 2017; 284:228-243. 3. Mild mediastinal lymphadenopathy. 4. Mosaic attenuation in the lungs bilaterally is nonspecific but may reflect air trapping from small airways disease. 5. Small bilateral pleural effusions. 6. New ascites around the liver and spleen. 7. Cholelithiasis. 8.  Aortic Atherosclerosis (ICD10-I70.0). Electronically Signed   By: Camellia Candle M.D.   On: 07/10/2024 07:12   DG Chest Port 1 View Result Date: 07/10/2024 EXAM: 1 VIEW(S) XRAY OF THE CHEST 07/10/2024 02:10:00 AM COMPARISON: 06/25/2024 CLINICAL HISTORY: DOE. Lethargic, leg swelling. FINDINGS: LINES, TUBES AND DEVICES: Left subclavian ICD. LUNGS AND PLEURA: Mild right basilar opacity, likely atelectasis. Faint patchy / nodular opacities in the right upper lobe, persistent, although possibly osseous. No pulmonary edema. No pleural effusion. No pneumothorax. HEART AND MEDIASTINUM: Cardiomegaly. Thoracic aortic atherosclerosis. BONES AND SOFT TISSUES: No acute osseous abnormality. IMPRESSION: 1. Cardiomegaly. No frank interstitial edema. 2. Faint patchy/nodular opacities in the right upper lobe, equivocal. Consider chest CT for further characterization if clinically warranted. 3. Mild right basilar opacity, likely atelectasis. Electronically signed by: Pinkie Pebbles MD 07/10/2024 02:18 AM EDT RP Workstation: [id number]   DG Chest Portable 1 View Result Date: 06/25/2024 CLINICAL DATA:  Increased shortness of breath and peripheral edema. EXAM: PORTABLE CHEST 1 VIEW COMPARISON:  05/07/2024 FINDINGS: Enlarged cardiac silhouette with an interval increase in size. Stable left subclavian AICD lead. Mild diffuse peribronchial thickening without significant change. Interval minimal patchy density in the right mid to upper lung zone. No acute bony abnormality. IMPRESSION: 1. Interval minimal patchy density in the right mid  to upper lung zone, suspicious for pneumonia. 2. Mildly progressive  cardiomegaly. 3. Stable mild bronchitic changes. Electronically Signed   By: Elspeth Bathe M.D.   On: 06/25/2024 17:09    There are no new results to review at this time.  Previous records (including but not limited to H&P, progress notes, nursing notes, TOC management) were reviewed in assessment of this patient.  Labs: CBC: Recent Labs  Lab 07/10/24 0227 07/10/24 0236 07/12/24 0500  WBC 8.6  --  8.9  NEUTROABS 6.8  --   --   HGB 11.8* 13.6  13.9 10.4*  HCT 37.2* 40.0  41.0 31.9*  MCV 94.4  --  91.9  PLT 149*  --  108*   Basic Metabolic Panel: Recent Labs  Lab 07/11/24 0249 07/11/24 1530 07/12/24 0500 07/13/24 0602 07/13/24 1500 07/14/24 0543 07/15/24 0621  NA 133*   < > 133* 132* 132* 129* 129*  K 4.7   < > 3.9 4.2 4.5 4.0 3.6  CL 102   < > 100 99 97* 94* 90*  CO2 19*   < > 23 23 25 26 30   GLUCOSE 89   < > 117* 128* 135* 99 96  BUN 49*   < > 52* 57* 61* 64* 69*  CREATININE 2.41*   < > 2.48* 2.70* 2.62* 2.03* 2.50*  CALCIUM  9.9   < > 9.5 9.5 10.1 10.1 10.2  MG 2.2  --  2.1 2.2  --  2.0 2.1   < > = values in this interval not displayed.   Liver Function Tests: Recent Labs  Lab 07/10/24 0227  AST 31  ALT 35  ALKPHOS 89  BILITOT 3.3*  PROT 7.1  ALBUMIN  3.5   CBG: No results for input(s): GLUCAP in the last 168 hours.  Scheduled Meds:  amiodarone   200 mg Oral Daily   apixaban   5 mg Oral BID   atorvastatin   40 mg Oral Daily   Chlorhexidine  Gluconate Cloth  6 each Topical Daily   feeding supplement  237 mL Oral BID BM   ferrous sulfate   325 mg Oral Daily   metolazone   5 mg Oral BID   multivitamin with minerals  1 tablet Oral Daily   sodium chloride  flush  10-40 mL Intracatheter Q12H   sodium chloride  flush  3 mL Intravenous Q12H   Continuous Infusions:  furosemide  (LASIX ) 200 mg in dextrose  5 % 100 mL (2 mg/mL) infusion 20 mg/hr (07/14/24 2049)   milrinone  0.25 mcg/kg/min (07/14/24 2050)   PRN Meds:.acetaminophen , melatonin, ondansetron  (ZOFRAN )  IV, polyethylene glycol, sodium chloride  flush, sodium chloride  flush  Family Communication: None at bedside  Disposition: Status is: Inpatient Remains inpatient appropriate because: HFrEF     Time spent: 42 minutes  Length of inpatient stay: 5 days  Author: Carliss LELON Canales, DO 07/15/2024 12:55 PM  For on call review [email or web address].

## 2024-07-15 NOTE — Progress Notes (Addendum)
 Patient ID: Dean Calabretta Sr., male   DOB: [birth year], 73 y.o.   MRN: [id number]     Advanced Heart Failure Rounding Note  Cardiologist: Vinie JAYSON Maxcy, MD  HF Cardiologist: Dr. [name]  Chief Complaint: CHF  Subjective:    CO-OX 67% on 0.25 milrinone .  -4.9L UOP charted last 24 hrs with lasix  gtt at 20 mg/hr + 5 mg metolazone  BID. Weight down 6 lb. CVP 9-12. SCr pending today.   Feels ok this morning. Breathing continues to improve.   Objective:   Weight Range: 89 kg Body mass index is 31.67 kg/m.   Vital Signs:   Temp:  [97.4 F (36.3 C)-98.4 F (36.9 C)] 98.3 F (36.8 C) (10/21 0439) Pulse Rate:  [71-77] 71 (10/21 0326) Resp:  [18-23] 23 (10/21 0326) BP: (123-144)/(46-55) 144/53 (10/21 0326) SpO2:  [95 %-100 %] 99 % (10/21 0326) Weight:  [89 kg] 89 kg (10/21 0326) Last BM Date : 07/13/24  Weight change: Filed Weights   07/13/24 0437 07/14/24 0553 07/15/24 0326  Weight: 94.6 kg 91.7 kg 89 kg    Intake/Output:   Intake/Output Summary (Last 24 hours) at 07/15/2024 0743 Last data filed at 07/15/2024 0326 Gross per 24 hour  Intake 837 ml  Output 4900 ml  Net -4063 ml   CVP 9-12 Physical Exam    General:  elderly appearing.  No respiratory difficulty Neck: JVD to jaw.  Cor: Regular rate & rhythm.  Lungs: clear Extremities: +2 BLE edema  Neuro: alert & oriented x 3. Affect pleasant.   Telemetry   NSR 80s (Personally reviewed)    Labs    CBC No results for input(s): WBC, NEUTROABS, HGB, HCT, MCV, PLT in the last 72 hours.  Basic Metabolic Panel Recent Labs    89/80/74 0602 07/13/24 1500 07/14/24 0543  NA 132* 132* 129*  K 4.2 4.5 4.0  CL 99 97* 94*  CO2 23 25 26   GLUCOSE 128* 135* 99  BUN 57* 61* 64*  CREATININE 2.70* 2.62* 2.03*  CALCIUM  9.5 10.1 10.1  MG 2.2  --  2.0   Liver Function Tests No results for input(s): AST, ALT, ALKPHOS, BILITOT, PROT, ALBUMIN  in the last 72 hours.  No results for input(s):  LIPASE, AMYLASE in the last 72 hours. Cardiac Enzymes No results for input(s): CKTOTAL, CKMB, CKMBINDEX, TROPONINI in the last 72 hours.  BNP: BNP (last 3 results) Recent Labs    05/16/24 1308 06/25/24 1604 07/10/24 0227  BNP 618.2* 1,585.3* 1,952.1*    ProBNP (last 3 results) No results for input(s): PROBNP in the last 8760 hours.   D-Dimer No results for input(s): DDIMER in the last 72 hours. Hemoglobin A1C No results for input(s): HGBA1C in the last 72 hours. Fasting Lipid Panel No results for input(s): CHOL, HDL, LDLCALC, TRIG, CHOLHDL, LDLDIRECT in the last 72 hours. Thyroid Function Tests No results for input(s): TSH, T4TOTAL, T3FREE, THYROIDAB in the last 72 hours.  Invalid input(s): FREET3  Other results:   Imaging    No results found.    Medications:     Scheduled Medications:  amiodarone   200 mg Oral Daily   apixaban   5 mg Oral BID   atorvastatin   40 mg Oral Daily   Chlorhexidine  Gluconate Cloth  6 each Topical Daily   feeding supplement  237 mL Oral BID BM   ferrous sulfate   325 mg Oral Daily   multivitamin with minerals  1 tablet Oral Daily   sodium chloride  flush  10-40 mL Intracatheter  Q12H   sodium chloride  flush  3 mL Intravenous Q12H    Infusions:  furosemide  (LASIX ) 200 mg in dextrose  5 % 100 mL (2 mg/mL) infusion 20 mg/hr (07/14/24 2049)   milrinone  0.25 mcg/kg/min (07/14/24 2050)    PRN Medications: acetaminophen , melatonin, ondansetron  (ZOFRAN ) IV, polyethylene glycol, sodium chloride  flush, sodium chloride  flush    Patient Profile   73 y/o male w/ chronic systolic heart failure d/t ICM, CAD, MR, PAF, H/o VT/VF s/p ICD and CKD IIIb admitted w/ a/c CHF and AKI.   Assessment/Plan   Chronic systolic heart failure: Primarily ischemic, prior history of infarcted RCA.   - Most recent 2D echo 8/25 EF 35-40%, RV mildly reduced, moderate-severe MR.  - NYHA IIIb w/ volume overload - Sluggish  response to IV lasix , PICC placed with elevated CVP and low co-ox.  He was started on milrinone , now on 0.25.   - CO-OX 67% on 0.25 milrinone  - Diuresed well yesterday with lasix  gtt at 20/hr + 5 mg metolazone  BID. Continue lasix  gtt through today. Will repeat metolazone  today.  - CKD limits MRA and RAASi - given poor tolerance to Imdur /Hydralazine , will hold off on restarting for now  - No SGLT-2 with recent UTI - no ? blocker w/ concern for low output  - Asked for UNNA boots to come off yesterday. Place TED hose.  - ICD in place.   Mitral regurgitation: 2/2 ischemic disease.  TEE 9/25 with more moderate MR by PISA, valve area, systolic blunting of pulmonary veins. - HF optimization w/ GDMT and diuresis    AKI on CKD stage IIIb:  - Recent creatinine 1.8, but up to 2.5 on admission. Scr trended up to 2.7, now improving with inotrope support and diuresis. Scr last 2.0, pending today - Suspect cardiorenal - no SGLT2i given h/o UTIs   PAF - Previously diagnosed on device - observed runs of possible AT vs AFL while in the ED - NSR today - Continue amio 200 mg daily  - Continue Eliquis  5 mg twice a day     Hx VT/VF - Has single chamber ICD - Continue amiodarone  200 mg daily   CAD: Single vessel RCA disease - No anginal pain - No aspirin  with need for anticoagulation. Continue statin   Abnormal Chest CT  - posterior right pulmonary nodule is new since prior study + possible cirrhosis + hepatic lesions concerning for ? Metastatic dz, though liver cirrhosis may be 2/2 HF - defer further w/u to IM/PCP   Length of Stay: 5  Beckey LITTIE Coe, NP  07/15/2024, 7:43 AM  Advanced Heart Failure Team Pager [phone] (M-F; 7a - 5p)  Please contact [facility] for night-coverage after hours (5p -7a ) and weekends on [email or web address]  Patient seen and examined with the above-signed Advanced Practice Provider and/or Housestaff. I personally reviewed laboratory data, imaging studies and relevant notes.  I independently examined the patient and formulated the important aspects of the plan. I have edited the note to reflect any of my changes or salient points. I have personally discussed the plan with the patient and/or family.  Remains on milrinone . Co-ox ok. CVP still high. Diuresing on lasix  gtt at 20. Denies CP or SOB  General:  Sitting in chair  No resp difficulty HEENT: normal Neck: supple. JVP to ear Carotids 2+ bilat; no bruits. No lymphadenopathy or thryomegaly appreciated. Cor: PMI nondisplaced. Regular rate & rhythm. No rubs, gallops or murmurs. Lungs: clear Abdomen: soft, nontender, nondistended. No hepatosplenomegaly. No bruits  or masses. Good bowel sounds. Extremities: no cyanosis, clubbing, rash, 2-3+ edema Neuro: alert & orientedx3, cranial nerves grossly intact. moves all 4 extremities w/o difficulty. Affect pleasant  Remains volume overloaded. Continue IV diuresis with milrinone  support. Follow Scr closely. Scr stable ~2.5   Continue ambulation.   [name], MD  3:20 PM

## 2024-07-16 DIAGNOSIS — I5022 Chronic systolic (congestive) heart failure: Secondary | ICD-10-CM | POA: Diagnosis not present

## 2024-07-16 DIAGNOSIS — I5020 Unspecified systolic (congestive) heart failure: Secondary | ICD-10-CM | POA: Diagnosis not present

## 2024-07-16 LAB — BASIC METABOLIC PANEL WITH GFR
Anion gap: 10 (ref 5–15)
BUN: 76 mg/dL — ABNORMAL HIGH (ref 8–23)
CO2: 31 mmol/L (ref 22–32)
Calcium: 10.3 mg/dL (ref 8.9–10.3)
Chloride: 89 mmol/L — ABNORMAL LOW (ref 98–111)
Creatinine, Ser: 2.32 mg/dL — ABNORMAL HIGH (ref 0.61–1.24)
GFR, Estimated: 29 mL/min — ABNORMAL LOW (ref >60)
Glucose, Bld: 109 mg/dL — ABNORMAL HIGH (ref 70–99)
Potassium: 3.3 mmol/L — ABNORMAL LOW (ref 3.5–5.1)
Sodium: 130 mmol/L — ABNORMAL LOW (ref 135–145)

## 2024-07-16 LAB — MAGNESIUM: Magnesium: 2.1 mg/dL (ref 1.7–2.4)

## 2024-07-16 LAB — COOXEMETRY PANEL
Carboxyhemoglobin: 2.3 % — ABNORMAL HIGH (ref 0.5–1.5)
Methemoglobin: 0.7 % (ref 0.0–1.5)
O2 Saturation: 71.5 %
Total hemoglobin: 11.1 g/dL — ABNORMAL LOW (ref 12.0–16.0)

## 2024-07-16 MED ORDER — OXYCODONE HCL 5 MG PO TABS
5.0000 mg | ORAL_TABLET | Freq: Four times a day (QID) | ORAL | Status: DC | PRN
  Administered 2024-07-16: 5 mg via ORAL
  Filled 2024-07-16: qty 1

## 2024-07-16 MED ORDER — METOLAZONE 5 MG PO TABS
5.0000 mg | ORAL_TABLET | Freq: Once | ORAL | Status: AC
Start: 2024-07-16 — End: 2024-07-16
  Administered 2024-07-16: 5 mg via ORAL
  Filled 2024-07-16: qty 1

## 2024-07-16 MED ORDER — POTASSIUM CHLORIDE CRYS ER 20 MEQ PO TBCR
40.0000 meq | EXTENDED_RELEASE_TABLET | Freq: Two times a day (BID) | ORAL | Status: AC
  Administered 2024-07-16 (×2): 40 meq via ORAL
  Filled 2024-07-16 (×2): qty 2

## 2024-07-16 NOTE — Plan of Care (Signed)

## 2024-07-16 NOTE — Hospital Course (Addendum)
 Brief Narrative:   73 y.o. male with medical history significant of HFrEF EF 40%, VT s/p AICD, PAF, hyperlipidemia, CKD IV and T2DM presents with weakness and fluid retention, found to have exacerbation of HFrEF.  Followed by CHF team, currently on Lasix  drip and milrinone .  Further management per their service.  Assessment & Plan:   Acute exacerbation of HFrEF/moderate mitral regurgitation, EF 35% - Last noted EF 35-40% with grade 2 DD.  History of previous hospitalization requiring inotropic support for diuresis.  Unknown wounds.  Cardiology/heart failure following closely.  PICC line and milrinone  drip.  Diuretic management per CHF team   Acute kidney injury on CKD 3B - Baseline creatinine 2.1, peaked at 2.7 and improving with diuretics.   Paroxysmal atrial fibrillation - Continue amiodarone  and Eliquis .  Currently NSR.  HypoKalemia/hypophosphatemia - Repletion   History of VT s/p ICD - Amiodarone    Diabetes mellitus type II - Insulin  sliding scale   Anemia of chronic disease - No active bleeding.  Hemoglobin stable @10    Possible cirrhosis with new liver lesions Pulmonary nodules -This is seen on the CT of the chest without contrast but no liver lesions were noted on the CT abdomen pelvis back in August.  According to the daughter, no prior history.   Will order AFP and acute hepatitis panel.  Eventually recommended to follow-up outpatient with PCP.  Will eventually need MRCP once renal function stabilizes   Physical debilitation muscle weakness - PT/OT on board.  Recommending home health.   DVT prophylaxis: Place TED hose Start: 07/15/24 1017 apixaban  (ELIQUIS ) tablet 5 mg      Code Status: Full Code Family Communication:   Status is: Inpatient Remains inpatient appropriate because: Ongoing management for CHF   PT Follow up Recs: Home Health Pt10/21/2025 1111  Subjective: Seen at bedside no complaints at rest He is sitting up in the recliner, son is present at  bedside  Examination:  General exam: Appears calm and comfortable  Respiratory system: Clear to auscultation. Respiratory effort normal. Cardiovascular system: S1 & S2 heard, RRR. No JVD, murmurs, rubs, gallops or clicks. No pedal edema. Gastrointestinal system: Abdomen is nondistended, soft and nontender. No organomegaly or masses felt. Normal bowel sounds heard. Central nervous system: Alert and oriented. No focal neurological deficits. Extremities: Symmetric 5 x 5 power. Skin: No rashes, lesions or ulcers Psychiatry: Judgement and insight appear normal. Mood & affect appropriate.

## 2024-07-16 NOTE — Progress Notes (Signed)
 Mobility Specialist Progress Note:    07/16/24 1150  Mobility  Activity Ambulated with assistance (In hallway)  Level of Assistance Standby assist, set-up cues, supervision of patient - no hands on  Assistive Device Front wheel walker  Distance Ambulated (ft) 140 ft  Activity Response Tolerated well  Mobility Referral Yes  Mobility visit 1 Mobility  Mobility Specialist Start Time (ACUTE ONLY) 0949  Mobility Specialist Stop Time (ACUTE ONLY) 1008  Mobility Specialist Time Calculation (min) (ACUTE ONLY) 19 min   Received pt in bed and agreeable to mobility. No physical assistance needed. VSS throughout. C/o pain on R side, otherwise tolerated well. Returned to room without fault. Left pt in bed. Personal belongings and call light within reach. RN notified.  Lavanda Pollack Mobility Specialist  Please contact via [employer] or  Rehab Office [phone]

## 2024-07-16 NOTE — Plan of Care (Signed)
  Problem: Clinical Measurements: Goal: Will remain free from infection Outcome: Progressing Goal: Respiratory complications will improve Outcome: Progressing Goal: Cardiovascular complication will be avoided Outcome: Progressing   Problem: Elimination: Goal: Will not experience complications related to bowel motility Outcome: Progressing Goal: Will not experience complications related to urinary retention Outcome: Progressing   

## 2024-07-16 NOTE — Progress Notes (Signed)
 PROGRESS NOTE    Dean Croft Sr.  FMW:[id number] DOB: 06/30/1951 DOA: 07/10/2024 PCP: [name], MD    Brief Narrative:   73 y.o. male with medical history significant of HFrEF EF 40%, VT s/p AICD, PAF, hyperlipidemia, CKD IV and T2DM presents with weakness and fluid retention, found to have exacerbation of HFrEF.  Followed by CHF team, currently on Lasix  drip and milrinone .  Further management per their service.  Assessment & Plan:   Acute exacerbation of HFrEF/moderate mitral regurgitation, EF 35% - Last noted EF 35-40% with grade 2 DD.  History of previous hospitalization requiring inotropic support for diuresis.  Unknown wounds.  Cardiology/heart failure following closely.  PICC line and milrinone  drip.  Diuretic management per CHF team   Acute kidney injury on CKD 3B - Baseline creatinine 2.1, peaked at 2.7 and improving with diuretics.   Paroxysmal atrial fibrillation - Continue amiodarone  and Eliquis .  Currently NSR.   History of VT s/p ICD - Amiodarone  on board.  Telemetry.   Diabetes mellitus type II - Insulin  sliding scale on board.   Anemia of chronic disease - No active bleeding.  Hemoglobin stable.  Will continue to monitor.   Possible cirrhosis with new liver lesions Pulmonary nodules -This is seen on the CT of the chest but no liver lesions were noted on the CT abdomen pelvis back in August.  I suspect once his CHF exacerbation has resolved, he can either follow-up outpatient or we can try and obtain MRCP along with further workup.   Physical debilitation muscle weakness - PT/OT on board.  Recommending home health.   DVT prophylaxis: Place TED hose Start: 07/15/24 1017 apixaban  (ELIQUIS ) tablet 5 mg      Code Status: Full Code Family Communication:   Status is: Inpatient Remains inpatient appropriate because: Ongoing management for CHF   PT Follow up Recs: Home Health Pt10/21/2025 1111  Subjective: Seen at bedside no complaints at  rest   Examination:  General exam: Appears calm and comfortable  Respiratory system: Clear to auscultation. Respiratory effort normal. Cardiovascular system: S1 & S2 heard, RRR. No JVD, murmurs, rubs, gallops or clicks. No pedal edema. Gastrointestinal system: Abdomen is nondistended, soft and nontender. No organomegaly or masses felt. Normal bowel sounds heard. Central nervous system: Alert and oriented. No focal neurological deficits. Extremities: Symmetric 5 x 5 power. Skin: No rashes, lesions or ulcers Psychiatry: Judgement and insight appear normal. Mood & affect appropriate.                Diet Orders (From admission, onward)     Start     Ordered   07/10/24 1741  Diet Heart Room service appropriate? Yes; Fluid consistency: Thin; Fluid restriction: 1500 mL Fluid  Diet effective now       Question Answer Comment  Room service appropriate? Yes   Fluid consistency: Thin   Fluid restriction: 1500 mL Fluid      07/10/24 1740            Objective: Vitals:   07/15/24 2308 07/16/24 0435 07/16/24 0726 07/16/24 1111  BP: (!) 135/50 (!) 140/42 (!) 116/46 (!) 131/43  Pulse: 75 72 72 73  Resp: 20 20 18 18   Temp: 97.9 F (36.6 C) 97.9 F (36.6 C) 98.3 F (36.8 C) 98.4 F (36.9 C)  TempSrc: Axillary Oral Oral Oral  SpO2: 97% 98% 99% 96%  Weight:  85.8 kg    Height:        Intake/Output Summary (Last 24 hours)  at 07/16/2024 1215 Last data filed at 07/16/2024 1110 Gross per 24 hour  Intake 1681.38 ml  Output 3150 ml  Net -1468.62 ml   Filed Weights   07/14/24 0553 07/15/24 0326 07/16/24 0435  Weight: 91.7 kg 89 kg 85.8 kg    Scheduled Meds:  amiodarone   200 mg Oral Daily   apixaban   5 mg Oral BID   atorvastatin   40 mg Oral Daily   Chlorhexidine  Gluconate Cloth  6 each Topical Daily   feeding supplement  237 mL Oral BID BM   ferrous sulfate   325 mg Oral Daily   multivitamin with minerals  1 tablet Oral Daily   potassium chloride   40 mEq Oral BID    sodium chloride  flush  10-40 mL Intracatheter Q12H   sodium chloride  flush  3 mL Intravenous Q12H   Continuous Infusions:  milrinone  0.25 mcg/kg/min (07/16/24 0603)    Nutritional status Signs/Symptoms: energy intake < 75% for > or equal to 1 month, mild fat depletion, mild muscle depletion Interventions: Ensure Enlive (each supplement provides 350kcal and 20 grams of protein), Education Body mass index is 30.53 kg/m.  Data Reviewed:   CBC: Recent Labs  Lab 07/10/24 0227 07/10/24 0236 07/12/24 0500  WBC 8.6  --  8.9  NEUTROABS 6.8  --   --   HGB 11.8* 13.6  13.9 10.4*  HCT 37.2* 40.0  41.0 31.9*  MCV 94.4  --  91.9  PLT 149*  --  108*   Basic Metabolic Panel: Recent Labs  Lab 07/12/24 0500 07/13/24 0602 07/13/24 1500 07/14/24 0543 07/15/24 0621 07/16/24 0439  NA 133* 132* 132* 129* 129* 130*  K 3.9 4.2 4.5 4.0 3.6 3.3*  CL 100 99 97* 94* 90* 89*  CO2 23 23 25 26 30 31   GLUCOSE 117* 128* 135* 99 96 109*  BUN 52* 57* 61* 64* 69* 76*  CREATININE 2.48* 2.70* 2.62* 2.03* 2.50* 2.32*  CALCIUM  9.5 9.5 10.1 10.1 10.2 10.3  MG 2.1 2.2  --  2.0 2.1 2.1   GFR: Estimated Creatinine Clearance: 28.7 mL/min (A) (by C-G formula based on SCr of 2.32 mg/dL (H)). Liver Function Tests: Recent Labs  Lab 07/10/24 0227  AST 31  ALT 35  ALKPHOS 89  BILITOT 3.3*  PROT 7.1  ALBUMIN  3.5   No results for input(s): LIPASE, AMYLASE in the last 168 hours. No results for input(s): AMMONIA in the last 168 hours. Coagulation Profile: No results for input(s): INR, PROTIME in the last 168 hours. Cardiac Enzymes: No results for input(s): CKTOTAL, CKMB, CKMBINDEX, TROPONINI in the last 168 hours. BNP (last 3 results) No results for input(s): PROBNP in the last 8760 hours. HbA1C: No results for input(s): HGBA1C in the last 72 hours. CBG: No results for input(s): GLUCAP in the last 168 hours. Lipid Profile: No results for input(s): CHOL, HDL, LDLCALC,  TRIG, CHOLHDL, LDLDIRECT in the last 72 hours. Thyroid Function Tests: No results for input(s): TSH, T4TOTAL, FREET4, T3FREE, THYROIDAB in the last 72 hours. Anemia Panel: No results for input(s): VITAMINB12, FOLATE, FERRITIN, TIBC, IRON, RETICCTPCT in the last 72 hours. Sepsis Labs: Recent Labs  Lab 07/10/24 1018 07/10/24 1323 07/11/24 0823 07/12/24 1345  LATICACIDVEN 1.7 2.3* 2.5* 1.4    Recent Results (from the past 240 hours)  Resp panel by RT-PCR (RSV, Flu A&B, Covid) Anterior Nasal Swab     Status: None   Collection Time: 07/10/24  2:27 AM   Specimen: Anterior Nasal Swab  Result Value Ref  Range Status   SARS Coronavirus 2 by RT PCR NEGATIVE NEGATIVE Final   Influenza A by PCR NEGATIVE NEGATIVE Final   Influenza B by PCR NEGATIVE NEGATIVE Final    Comment: (NOTE) The Xpert Xpress SARS-CoV-2/FLU/RSV plus assay is intended as an aid in the diagnosis of influenza from Nasopharyngeal swab specimens and should not be used as a sole basis for treatment. Nasal washings and aspirates are unacceptable for Xpert Xpress SARS-CoV-2/FLU/RSV testing.  Fact Sheet for Patients: BloggerCourse.com  Fact Sheet for Healthcare Providers: SeriousBroker.it  This test is not yet approved or cleared by the [place]  FDA and has been authorized for detection and/or diagnosis of SARS-CoV-2 by FDA under an Emergency Use Authorization (EUA). This EUA will remain in effect (meaning this test can be used) for the duration of the COVID-19 declaration under Section 564(b)(1) of the Act, 21 U.S.C. section 360bbb-3(b)(1), unless the authorization is terminated or revoked.     Resp Syncytial Virus by PCR NEGATIVE NEGATIVE Final    Comment: (NOTE) Fact Sheet for Patients: BloggerCourse.com  Fact Sheet for Healthcare Providers: SeriousBroker.it  This test is not yet  approved or cleared by the [place]  FDA and has been authorized for detection and/or diagnosis of SARS-CoV-2 by FDA under an Emergency Use Authorization (EUA). This EUA will remain in effect (meaning this test can be used) for the duration of the COVID-19 declaration under Section 564(b)(1) of the Act, 21 U.S.C. section 360bbb-3(b)(1), unless the authorization is terminated or revoked.  Performed at [facility], [address], [place], [place] [address]   Urine Culture     Status: Abnormal   Collection Time: 07/10/24  4:07 AM   Specimen: Urine, Random  Result Value Ref Range Status   Specimen Description URINE, RANDOM  Final   Special Requests   Final    NONE Reflexed from [id number] Performed at [facility], [address], [place], [place] [address]    Culture MULTIPLE SPECIES PRESENT, SUGGEST RECOLLECTION (A)  Final   Report Status 07/11/2024 FINAL  Final         Radiology Studies: No results found.         LOS: 6 days   Time spent= 35 mins    Burgess JAYSON Dare, MD [facility]  If 7PM-7AM, please contact night-coverage  07/16/2024, 12:15 PM

## 2024-07-16 NOTE — Progress Notes (Addendum)
 Patient ID: Dean Brazzel Sr., male   DOB: 1951/03/25, 73 y.o.   MRN: [id number]     Advanced Heart Failure Rounding Note  Cardiologist: Vinie JAYSON Maxcy, MD  HF Cardiologist: Dr. [name]  Chief Complaint: CHF  Subjective:    CO-OX 71% on 0.25 milrinone .   Continues on lasix  gtt at 20 mg/hr, got 5 mg metolazone  BID yesterday. Weight down another 7 lb. Suspect Is/Os inaccurate.   Pleased with weight loss. Feels less bloated.   Objective:   Weight Range: 85.8 kg Body mass index is 30.53 kg/m.   Vital Signs:   Temp:  [97.7 F (36.5 C)-98.3 F (36.8 C)] 98.3 F (36.8 C) (10/22 0726) Pulse Rate:  [48-77] 72 (10/22 0726) Resp:  [18-20] 18 (10/22 0726) BP: (116-140)/(42-106) 116/46 (10/22 0726) SpO2:  [85 %-99 %] 99 % (10/22 0726) Weight:  [85.8 kg] 85.8 kg (10/22 0435) Last BM Date : 07/13/24  Weight change: Filed Weights   07/14/24 0553 07/15/24 0326 07/16/24 0435  Weight: 91.7 kg 89 kg 85.8 kg    Intake/Output:   Intake/Output Summary (Last 24 hours) at 07/16/2024 0754 Last data filed at 07/16/2024 9276 Gross per 24 hour  Intake 1318.38 ml  Output 2375 ml  Net -1056.62 ml    Physical Exam    General:  Elderly male. Sitting up on side of bed.. Cor: JVP midneck. Regular rate & rhythm. 2/6 MR murmur. Lungs: breathing nonlabored Abdomen: soft, nontender, nondistended.  Extremities: trace edema, + UNNA Neuro: alert & orientedx3. Affect pleasant.   Telemetry   SR 70s, 5-10 PVCs/min  Labs    CBC No results for input(s): WBC, NEUTROABS, HGB, HCT, MCV, PLT in the last 72 hours.  Basic Metabolic Panel Recent Labs    89/78/74 0621 07/16/24 0439  NA 129* 130*  K 3.6 3.3*  CL 90* 89*  CO2 30 31  GLUCOSE 96 109*  BUN 69* 76*  CREATININE 2.50* 2.32*  CALCIUM  10.2 10.3  MG 2.1 2.1   Liver Function Tests No results for input(s): AST, ALT, ALKPHOS, BILITOT, PROT, ALBUMIN  in the last 72 hours.  No results for input(s): LIPASE,  AMYLASE in the last 72 hours. Cardiac Enzymes No results for input(s): CKTOTAL, CKMB, CKMBINDEX, TROPONINI in the last 72 hours.  BNP: BNP (last 3 results) Recent Labs    05/16/24 1308 06/25/24 1604 07/10/24 0227  BNP 618.2* 1,585.3* 1,952.1*    ProBNP (last 3 results) No results for input(s): PROBNP in the last 8760 hours.   D-Dimer No results for input(s): DDIMER in the last 72 hours. Hemoglobin A1C No results for input(s): HGBA1C in the last 72 hours. Fasting Lipid Panel No results for input(s): CHOL, HDL, LDLCALC, TRIG, CHOLHDL, LDLDIRECT in the last 72 hours. Thyroid Function Tests No results for input(s): TSH, T4TOTAL, T3FREE, THYROIDAB in the last 72 hours.  Invalid input(s): FREET3  Other results:   Imaging    No results found.    Medications:     Scheduled Medications:  amiodarone   200 mg Oral Daily   apixaban   5 mg Oral BID   atorvastatin   40 mg Oral Daily   Chlorhexidine  Gluconate Cloth  6 each Topical Daily   feeding supplement  237 mL Oral BID BM   ferrous sulfate   325 mg Oral Daily   multivitamin with minerals  1 tablet Oral Daily   potassium chloride   40 mEq Oral BID   sodium chloride  flush  10-40 mL Intracatheter Q12H   sodium chloride  flush  3 mL Intravenous Q12H    Infusions:  furosemide  (LASIX ) 200 mg in dextrose  5 % 100 mL (2 mg/mL) infusion 20 mg/hr (07/15/24 2021)   milrinone  0.25 mcg/kg/min (07/16/24 0603)    PRN Medications: acetaminophen , melatonin, ondansetron  (ZOFRAN ) IV, polyethylene glycol, sodium chloride  flush, sodium chloride  flush    Patient Profile   73 y/o male w/ chronic systolic heart failure d/t ICM, CAD, MR, PAF, H/o VT/VF s/p ICD and CKD IIIb admitted w/ a/c CHF and AKI.   Assessment/Plan   Chronic systolic heart failure: Primarily ischemic, prior history of infarcted RCA.   - Most recent 2D echo 8/25 EF 35-40%, RV mildly reduced, moderate-severe MR.  - NYHA IIIb w/  volume overload - Sluggish response to IV lasix , PICC placed with elevated CVP and low co-ox.  He was started on milrinone , now on 0.25.   - CO-OX 71% on 0.25 milrinone .  - continues to diurese well with lasix  gtt at 20 mg/hr + metolazone . CVP down to 9. Volume much improved. Give 1 more dose of metolazone  this am. Stop lasix  gtt at noon and convert to PO torsemide 60 mg daily (had recently increased po lasix  to 40 BID as outpatient) - Start milrinone  wean tomorrow - CKD limits MRA and RAASi - given poor tolerance to Imdur /Hydralazine , will hold off on restarting for now  - No SGLT-2 with recent UTI - no ? blocker w/ concern for low output  - UNNA boots - ICD in place.   Mitral regurgitation: 2/2 ischemic disease.  TEE 9/25 with more moderate MR by PISA, valve area, systolic blunting of pulmonary veins. 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 Scr trended up to 2.7, now improving with inotrope support and diuresis. Scr 2.3 today - Suspect cardiorenal - no SGLT2i given h/o UTIs   PAF - Previously diagnosed on device - observed runs of possible AT vs AFL while in the ED - SR today - Continue amio 200 mg daily  - Continue Eliquis  5 mg twice a day     Hx VT/VF - Has single chamber ICD - Continue amiodarone  200 mg daily   CAD: Single vessel RCA disease - No anginal pain - No aspirin  with need for anticoagulation. Continue statin   Abnormal Chest CT  - posterior right pulmonary nodule is new since prior study + possible cirrhosis + hepatic lesions concerning for ? Metastatic dz, though liver cirrhosis may be 2/2 HF - defer further w/u to IM/PCP   Length of Stay: 6  FINCH, LINDSAY N, PA-C  07/16/2024, 7:54 AM  Advanced Heart Failure Team Pager [phone] (M-F; 7a - 5p)  Please contact [facility] for night-coverage after hours (5p -7a ) and  weekends on [email or web address]   Patient seen and examined with the above-signed Advanced Practice Provider and/or Housestaff. I personally reviewed laboratory data, imaging studies and relevant notes. I independently examined the patient and formulated the important aspects of the plan. I have edited the note to reflect any of my changes or salient points. I have personally discussed the plan with the patient and/or family.  Remains on milrinone . Has diuresed well. Breathing ok. CVP 9 co-ox 71%  General:  Sitting up No resp difficulty HEENT: normal Neck: supple. nJVP 10  Cor: PMI nondisplaced. Regular rate & rhythm. No rubs, gallops or murmurs. Lungs: clear  Abdomen: soft, nontender, nondistended. No hepatosplenomegaly. No bruits or masses. Good bowel sounds. Extremities: no cyanosis, clubbing, rash, tr edema + UNNA Neuro: alert & orientedx3, cranial nerves grossly intact. moves all 4 extremities w/o difficulty. Affect pleasant  Volume status nearly optimized. Can stop lasix  gtt. Put on po. Wean milrinone  slowly. Watch renal function. Attempt to reinstitute GDMT as able.   [name], MD  4:13 PM

## 2024-07-16 NOTE — TOC Progression Note (Signed)
 Transition of Care (TOC) - Progression Note    Patient Details  Name: Dean Lilley Sr. MRN: [id number] Date of Birth: 1951/01/28  [facility]) CM/SW Contact  [name] Phone Number: [phone] 07/16/2024, 11:21 AM  Clinical Narrative:  10:35 AM- HF CSW attempted to meet with patient at bedside. Patient was sleeping. CSW will follow up with patient at a more appropriate time.   HF CSW/CM will continue to follow and monitor for dc readiness.                        Expected Discharge Plan and Services                                               Social Drivers of Health (SDOH) Interventions SDOH Screenings   Food Insecurity: No Food Insecurity (07/10/2024)  Housing: Low Risk  (07/10/2024)  Transportation Needs: No Transportation Needs (07/10/2024)  Utilities: Not At Risk (07/10/2024)  Social Connections: Moderately Integrated (07/10/2024)  Recent Concern: Social Connections - Moderately Isolated (04/20/2024)  Tobacco Use: Low Risk  (07/10/2024)    Readmission Risk Interventions    05/09/2024    9:53 AM 04/22/2024   11:11 AM  Readmission Risk Prevention Plan  Transportation Screening Complete Complete  PCP or Specialist Appt within 5-7 Days  Complete  Home Care Screening  Complete  Medication Review (RN CM)  Complete  Medication Review (RN Care Manager) Complete   PCP or Specialist appointment within 3-5 days of discharge Complete   HRI or Home Care Consult Complete   SW Recovery Care/Counseling Consult Complete   Palliative Care Screening Not Applicable   Skilled Nursing Facility Not Applicable

## 2024-07-16 NOTE — TOC Progression Note (Signed)
 Transition of Care (TOC) - Progression Note    Patient Details  Name: Dean Talerico Sr. MRN: [id number] Date of Birth: [birth year]  Transition of [facility] [place] CM/SW Contact  [name] Phone Number: [phone] 07/16/2024, 2:55 PM  Clinical Narrative:  HF CSW followed up with patient to see if he needed anything. At this time the patient stated that he was ok, and looking forward to going home soon.   HF CSW/CM will continue to follow and monitor for dc readiness.                        Expected Discharge Plan and Services                                               Social Drivers of Health (SDOH) Interventions SDOH Screenings   Food Insecurity: No Food Insecurity (07/10/2024)  Housing: Low Risk  (07/10/2024)  Transportation Needs: No Transportation Needs (07/10/2024)  Utilities: Not At Risk (07/10/2024)  Social Connections: Moderately Integrated (07/10/2024)  Recent Concern: Social Connections - Moderately Isolated (04/20/2024)  Tobacco Use: Low Risk  (07/10/2024)    Readmission Risk Interventions    05/09/2024    9:53 AM 04/22/2024   11:11 AM  Readmission Risk Prevention Plan  Transportation Screening Complete Complete  PCP or Specialist Appt within 5-7 Days  Complete  Home Care Screening  Complete  Medication Review (RN CM)  Complete  Medication Review (RN Care Manager) Complete   PCP or Specialist appointment within 3-5 days of discharge Complete   HRI or Home Care Consult Complete   SW Recovery Care/Counseling Consult Complete   Palliative Care Screening Not Applicable   Skilled Nursing Facility Not Applicable

## 2024-07-17 DIAGNOSIS — I5043 Acute on chronic combined systolic (congestive) and diastolic (congestive) heart failure: Secondary | ICD-10-CM | POA: Diagnosis not present

## 2024-07-17 DIAGNOSIS — I5041 Acute combined systolic (congestive) and diastolic (congestive) heart failure: Secondary | ICD-10-CM | POA: Diagnosis not present

## 2024-07-17 LAB — COOXEMETRY PANEL
Carboxyhemoglobin: 3.2 % — ABNORMAL HIGH (ref 0.5–1.5)
Methemoglobin: <0.7 % (ref 0.0–1.5)
O2 Saturation: 73.5 %
Total hemoglobin: 10.6 g/dL — ABNORMAL LOW (ref 12.0–16.0)

## 2024-07-17 LAB — MAGNESIUM: Magnesium: 1.8 mg/dL (ref 1.7–2.4)

## 2024-07-17 LAB — BASIC METABOLIC PANEL WITH GFR
Anion gap: 11 (ref 5–15)
BUN: 78 mg/dL — ABNORMAL HIGH (ref 8–23)
CO2: 33 mmol/L — ABNORMAL HIGH (ref 22–32)
Calcium: 9.9 mg/dL (ref 8.9–10.3)
Chloride: 86 mmol/L — ABNORMAL LOW (ref 98–111)
Creatinine, Ser: 2.29 mg/dL — ABNORMAL HIGH (ref 0.61–1.24)
GFR, Estimated: 29 mL/min — ABNORMAL LOW (ref >60)
Glucose, Bld: 95 mg/dL (ref 70–99)
Potassium: 3.6 mmol/L (ref 3.5–5.1)
Sodium: 130 mmol/L — ABNORMAL LOW (ref 135–145)

## 2024-07-17 LAB — HEPATITIS PANEL, ACUTE
HCV Ab: NONREACTIVE
Hep A IgM: NONREACTIVE
Hep B C IgM: NONREACTIVE
Hepatitis B Surface Ag: NONREACTIVE

## 2024-07-17 LAB — PHOSPHORUS: Phosphorus: 1.8 mg/dL — ABNORMAL LOW (ref 2.5–4.6)

## 2024-07-17 MED ORDER — MAGNESIUM SULFATE 2 GM/50ML IV SOLN
2.0000 g | Freq: Once | INTRAVENOUS | Status: AC
  Administered 2024-07-17: 2 g via INTRAVENOUS
  Filled 2024-07-17: qty 50

## 2024-07-17 MED ORDER — MILRINONE LACTATE IN DEXTROSE 20-5 MG/100ML-% IV SOLN
0.1250 ug/kg/min | INTRAVENOUS | Status: DC
  Administered 2024-07-17 (×2): 0.125 ug/kg/min via INTRAVENOUS
  Filled 2024-07-17: qty 100

## 2024-07-17 MED ORDER — ORAL CARE MOUTH RINSE: 15.0000 mL | OROMUCOSAL | Status: DC | PRN

## 2024-07-17 MED ORDER — ISOSORBIDE MONONITRATE ER 30 MG PO TB24
15.0000 mg | ORAL_TABLET | Freq: Every day | ORAL | Status: DC
  Administered 2024-07-17: 15 mg via ORAL
  Filled 2024-07-17 (×2): qty 1

## 2024-07-17 MED ORDER — POTASSIUM CHLORIDE CRYS ER 20 MEQ PO TBCR
20.0000 meq | EXTENDED_RELEASE_TABLET | Freq: Once | ORAL | Status: AC
  Administered 2024-07-17: 20 meq via ORAL
  Filled 2024-07-17: qty 1

## 2024-07-17 MED ORDER — POTASSIUM CHLORIDE CRYS ER 20 MEQ PO TBCR
40.0000 meq | EXTENDED_RELEASE_TABLET | Freq: Once | ORAL | Status: AC
  Administered 2024-07-17: 40 meq via ORAL
  Filled 2024-07-17: qty 2

## 2024-07-17 MED ORDER — TORSEMIDE 20 MG PO TABS
60.0000 mg | ORAL_TABLET | Freq: Every day | ORAL | Status: DC
  Administered 2024-07-17 – 2024-07-19 (×3): 60 mg via ORAL
  Filled 2024-07-17 (×3): qty 3

## 2024-07-17 MED ORDER — MILRINONE LACTATE IN DEXTROSE 20-5 MG/100ML-% IV SOLN: 0.1250 ug/kg/min | INTRAVENOUS | Status: DC

## 2024-07-17 MED ORDER — HYDRALAZINE HCL 10 MG PO TABS
10.0000 mg | ORAL_TABLET | Freq: Three times a day (TID) | ORAL | Status: DC
  Administered 2024-07-17 – 2024-07-19 (×7): 10 mg via ORAL
  Filled 2024-07-17 (×7): qty 1

## 2024-07-17 MED ORDER — K PHOS MONO-SOD PHOS DI & MONO 155-852-130 MG PO TABS
500.0000 mg | ORAL_TABLET | Freq: Once | ORAL | Status: AC
  Administered 2024-07-17: 500 mg via ORAL
  Filled 2024-07-17: qty 2

## 2024-07-17 NOTE — Progress Notes (Signed)
 Mobility Specialist Progress Note:    07/17/24 1538  Mobility  Activity Ambulated with assistance  Level of Assistance Standby assist, set-up cues, supervision of patient - no hands on  Assistive Device Front wheel walker  Distance Ambulated (ft) 200 ft  Activity Response Tolerated fair (1 standing break)  Mobility Referral Yes  Mobility visit 1 Mobility  Mobility Specialist Start Time (ACUTE ONLY) 1538  Mobility Specialist Stop Time (ACUTE ONLY) 1552  Mobility Specialist Time Calculation (min) (ACUTE ONLY) 14 min   Received pt laying in bed agreeable to session. No c/o any symptoms but pt needing to take a standing break upon return trip d/t slight fatigue. Pt otherwise moving and ambulating well. Returned pt to room w/ all needs met.   Venetia Keel Mobility Specialist Please Neurosurgeon or Rehab Office at [phone]

## 2024-07-17 NOTE — Plan of Care (Signed)
 Patient requested to remove Unna boots on bilateral lower extremities. Patient education provided, but patient is still non agreeable, patient stated  I want some relief and rest my foot from that. Unna boots removed, elevated bilateral lower extremities on pillow.  Safety precautions keep in place. Continue to provide care per plan.   Problem: Clinical Measurements: Goal: Will remain free from infection Outcome: Progressing Goal: Respiratory complications will improve Outcome: Progressing Goal: Cardiovascular complication will be avoided Outcome: Progressing   Problem: Coping: Goal: Level of anxiety will decrease Outcome: Progressing   Problem: Safety: Goal: Ability to remain free from injury will improve Outcome: Progressing

## 2024-07-17 NOTE — Plan of Care (Signed)

## 2024-07-17 NOTE — Discharge Instructions (Addendum)
 [name]  tran

## 2024-07-17 NOTE — Progress Notes (Signed)
 Patient ID: Dean Mayall Sr., male   DOB: [birth year], 73 y.o.   MRN: [id number]     Advanced Heart Failure Rounding Note  Cardiologist: Vinie JAYSON Maxcy, MD  HF Cardiologist: Dr. [name]  Chief Complaint: CHF  Subjective:    Co-ox 73.5% on 0.25 milrinone .  He is now off Lasix  gtt and on torsemide po, CVP 4 today.  Weight down another 6 lbs.    Creatinine stable at 2.29.   Objective:   Weight Range: 83.9 kg Body mass index is 29.85 kg/m.   Vital Signs:   Temp:  [98 F (36.7 C)-98.6 F (37 C)] 98 F (36.7 C) (10/23 0752) Pulse Rate:  [73-96] 96 (10/23 1106) Resp:  [17-21] 18 (10/23 0752) BP: (120-137)/(37-61) 120/37 (10/23 0752) SpO2:  [90 %-100 %] 93 % (10/23 1106) Weight:  [83.9 kg-86.4 kg] 83.9 kg (10/23 0506) Last BM Date : 07/16/24  Weight change: Filed Weights   07/16/24 0435 07/17/24 0309 07/17/24 0506  Weight: 85.8 kg 86.4 kg 83.9 kg    Intake/Output:   Intake/Output Summary (Last 24 hours) at 07/17/2024 1115 Last data filed at 07/17/2024 9076 Gross per 24 hour  Intake 1534.79 ml  Output 1750 ml  Net -215.21 ml    Physical Exam    General:  Elderly male. Sitting up on side of bed.. Cor: JVP midneck. Regular rate & rhythm. 2/6 MR murmur. Lungs: breathing nonlabored Abdomen: soft, nontender, nondistended.  Extremities: trace edema, + UNNA Neuro: alert & orientedx3. Affect pleasant.   Telemetry   NSR, PVCs (personally reviewed)  Labs    CBC No results for input(s): WBC, NEUTROABS, HGB, HCT, MCV, PLT in the last 72 hours.  Basic Metabolic Panel Recent Labs    89/77/74 0439 07/17/24 0455  NA 130* 130*  K 3.3* 3.6  CL 89* 86*  CO2 31 33*  GLUCOSE 109* 95  BUN 76* 78*  CREATININE 2.32* 2.29*  CALCIUM  10.3 9.9  MG 2.1 1.8  PHOS  --  1.8*   Liver Function Tests No results for input(s): AST, ALT, ALKPHOS, BILITOT, PROT, ALBUMIN  in the last 72 hours.  No results for input(s): LIPASE, AMYLASE in the last 72  hours. Cardiac Enzymes No results for input(s): CKTOTAL, CKMB, CKMBINDEX, TROPONINI in the last 72 hours.  BNP: BNP (last 3 results) Recent Labs    05/16/24 1308 06/25/24 1604 07/10/24 0227  BNP 618.2* 1,585.3* 1,952.1*    ProBNP (last 3 results) No results for input(s): PROBNP in the last 8760 hours.   D-Dimer No results for input(s): DDIMER in the last 72 hours. Hemoglobin A1C No results for input(s): HGBA1C in the last 72 hours. Fasting Lipid Panel No results for input(s): CHOL, HDL, LDLCALC, TRIG, CHOLHDL, LDLDIRECT in the last 72 hours. Thyroid Function Tests No results for input(s): TSH, T4TOTAL, T3FREE, THYROIDAB in the last 72 hours.  Invalid input(s): FREET3  Other results:   Imaging    No results found.    Medications:     Scheduled Medications:  amiodarone   200 mg Oral Daily   apixaban   5 mg Oral BID   atorvastatin   40 mg Oral Daily   Chlorhexidine  Gluconate Cloth  6 each Topical Daily   feeding supplement  237 mL Oral BID BM   ferrous sulfate   325 mg Oral Daily   hydrALAZINE   10 mg Oral Q8H   isosorbide  mononitrate  15 mg Oral Daily   multivitamin with minerals  1 tablet Oral Daily   sodium chloride  flush  10-40 mL Intracatheter Q12H   sodium chloride  flush  3 mL Intravenous Q12H    Infusions:  magnesium  sulfate bolus IVPB 2 g (07/17/24 1030)   milrinone       PRN Medications: acetaminophen , melatonin, ondansetron  (ZOFRAN ) IV, oxyCODONE , polyethylene glycol, sodium chloride  flush, sodium chloride  flush    Patient Profile   73 y/o male w/ chronic systolic heart failure d/t ICM, CAD, MR, PAF, H/o VT/VF s/p ICD and CKD IIIb admitted w/ a/c CHF and AKI.   Assessment/Plan   Chronic systolic heart failure: Primarily ischemic, prior history of infarcted RCA.   - Most recent 2D echo 8/25 EF 35-40%, RV mildly reduced, moderate-severe MR.  - NYHA IIIb w/ volume overload - Sluggish response to IV lasix ,  PICC placed with elevated CVP and low co-ox.  He was started on milrinone , now on 0.25.   - CO-OX 73.5% on 0.25 milrinone , decrease to 0.125. Hopefully stop tomorrow.  - Start torsemide 60 mg daily this evening, CVP 4 today.  - CKD limits MRA and RAASi - I will try him on hydralazine  10 mg tid + Imdur  15 mg daily.  We are very limited in what we can use for GDMT. He was intolerant of hydralazine  before but at a much higher dose.   - No SGLT-2 with recent UTI - no ? blocker w/ concern for low output  - UNNA boots - ICD in place.   Mitral regurgitation: 2/2 ischemic disease.  TEE 9/25 with more moderate MR by PISA, valve area, systolic blunting of pulmonary veins. 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 Scr trended up to 2.7, now improving with inotrope support and diuresis. Scr 2.3 today - Suspect cardiorenal - no SGLT2i given h/o UTIs   PAF - Previously diagnosed on device - observed runs of possible AT vs AFL while in the ED - NSR today - Continue amio 200 mg daily  - Continue Eliquis  5 mg twice a day     Hx VT/VF - Has single chamber ICD - Continue amiodarone  200 mg daily   CAD: Single vessel RCA disease - No anginal pain - No aspirin  with need for anticoagulation. Continue statin   Abnormal Chest CT  - posterior right pulmonary nodule is new since prior study + possible cirrhosis + hepatic lesions concerning for ? Metastatic dz, though liver cirrhosis may be 2/2 HF - defer further w/u to IM/PCP   Length of Stay: 7  Ezra Shuck, MD  07/17/2024, 11:15 AM  Advanced Heart Failure Team Pager [phone] (M-F; 7a - 5p)  Please contact [facility] for night-coverage after hours (5p -7a ) and weekends on [email or web address]

## 2024-07-17 NOTE — Progress Notes (Signed)
 PROGRESS NOTE    Dean Croft Sr.  FMW:[id number] DOB: 21-Jan-1951 DOA: 07/10/2024 PCP: [name], MD    Brief Narrative:   73 y.o. male with medical history significant of HFrEF EF 40%, VT s/p AICD, PAF, hyperlipidemia, CKD IV and T2DM presents with weakness and fluid retention, found to have exacerbation of HFrEF.  Followed by CHF team, currently on Lasix  drip and milrinone .  Further management per their service.  Assessment & Plan:   Acute exacerbation of HFrEF/moderate mitral regurgitation, EF 35% - Last noted EF 35-40% with grade 2 DD.  History of previous hospitalization requiring inotropic support for diuresis.  Unknown wounds.  Cardiology/heart failure following closely.  PICC line and milrinone  drip.  Diuretic management per CHF team   Acute kidney injury on CKD 3B - Baseline creatinine 2.1, peaked at 2.7 and improving with diuretics.   Paroxysmal atrial fibrillation - Continue amiodarone  and Eliquis .  Currently NSR.  HypoKalemia/hypophosphatemia - Repletion   History of VT s/p ICD - Amiodarone    Diabetes mellitus type II - Insulin  sliding scale   Anemia of chronic disease - No active bleeding.  Hemoglobin stable @10    Possible cirrhosis with new liver lesions Pulmonary nodules -This is seen on the CT of the chest without contrast but no liver lesions were noted on the CT abdomen pelvis back in August.  According to the daughter, no prior history.   Will order AFP and acute hepatitis panel.  Eventually recommended to follow-up outpatient with PCP.  Will eventually need MRCP once renal function stabilizes   Physical debilitation muscle weakness - PT/OT on board.  Recommending home health.   DVT prophylaxis: Place TED hose Start: 07/15/24 1017 apixaban  (ELIQUIS ) tablet 5 mg      Code Status: Full Code Family Communication:   Status is: Inpatient Remains inpatient appropriate because: Ongoing management for CHF   PT Follow up Recs: Home Health  Pt10/21/2025 1111  Subjective: Seen at bedside no complaints at rest He is sitting up in the recliner, son is present at bedside  Examination:  General exam: Appears calm and comfortable  Respiratory system: Clear to auscultation. Respiratory effort normal. Cardiovascular system: S1 & S2 heard, RRR. No JVD, murmurs, rubs, gallops or clicks. No pedal edema. Gastrointestinal system: Abdomen is nondistended, soft and nontender. No organomegaly or masses felt. Normal bowel sounds heard. Central nervous system: Alert and oriented. No focal neurological deficits. Extremities: Symmetric 5 x 5 power. Skin: No rashes, lesions or ulcers Psychiatry: Judgement and insight appear normal. Mood & affect appropriate.                Diet Orders (From admission, onward)     Start     Ordered   07/10/24 1741  Diet Heart Room service appropriate? Yes; Fluid consistency: Thin; Fluid restriction: 1500 mL Fluid  Diet effective now       Question Answer Comment  Room service appropriate? Yes   Fluid consistency: Thin   Fluid restriction: 1500 mL Fluid      07/10/24 1740            Objective: Vitals:   07/17/24 0440 07/17/24 0506 07/17/24 0752 07/17/24 1106  BP: (!) 132/46  (!) 120/37   Pulse: 75  80 96  Resp: 17  18   Temp: 98.5 F (36.9 C)  98 F (36.7 C)   TempSrc: Oral  Oral   SpO2: 98%  100% 93%  Weight:  83.9 kg    Height:  Intake/Output Summary (Last 24 hours) at 07/17/2024 1141 Last data filed at 07/17/2024 9076 Gross per 24 hour  Intake 1534.79 ml  Output 1750 ml  Net -215.21 ml   Filed Weights   07/16/24 0435 07/17/24 0309 07/17/24 0506  Weight: 85.8 kg 86.4 kg 83.9 kg    Scheduled Meds:  amiodarone   200 mg Oral Daily   apixaban   5 mg Oral BID   atorvastatin   40 mg Oral Daily   Chlorhexidine  Gluconate Cloth  6 each Topical Daily   feeding supplement  237 mL Oral BID BM   ferrous sulfate   325 mg Oral Daily   hydrALAZINE   10 mg Oral Q8H    isosorbide  mononitrate  15 mg Oral Daily   multivitamin with minerals  1 tablet Oral Daily   potassium chloride   20 mEq Oral Once   sodium chloride  flush  10-40 mL Intracatheter Q12H   sodium chloride  flush  3 mL Intravenous Q12H   torsemide  60 mg Oral Daily   Continuous Infusions:  milrinone  0.125 mcg/kg/min (07/17/24 1128)    Nutritional status Signs/Symptoms: energy intake < 75% for > or equal to 1 month, mild fat depletion, mild muscle depletion Interventions: Ensure Enlive (each supplement provides 350kcal and 20 grams of protein), Education Body mass index is 29.85 kg/m.  Data Reviewed:   CBC: Recent Labs  Lab 07/12/24 0500  WBC 8.9  HGB 10.4*  HCT 31.9*  MCV 91.9  PLT 108*   Basic Metabolic Panel: Recent Labs  Lab 07/13/24 0602 07/13/24 1500 07/14/24 0543 07/15/24 0621 07/16/24 0439 07/17/24 0455  NA 132* 132* 129* 129* 130* 130*  K 4.2 4.5 4.0 3.6 3.3* 3.6  CL 99 97* 94* 90* 89* 86*  CO2 23 25 26 30 31  33*  GLUCOSE 128* 135* 99 96 109* 95  BUN 57* 61* 64* 69* 76* 78*  CREATININE 2.70* 2.62* 2.03* 2.50* 2.32* 2.29*  CALCIUM  9.5 10.1 10.1 10.2 10.3 9.9  MG 2.2  --  2.0 2.1 2.1 1.8  PHOS  --   --   --   --   --  1.8*   GFR: Estimated Creatinine Clearance: 28.7 mL/min (A) (by C-G formula based on SCr of 2.29 mg/dL (H)). Liver Function Tests: No results for input(s): AST, ALT, ALKPHOS, BILITOT, PROT, ALBUMIN  in the last 168 hours. No results for input(s): LIPASE, AMYLASE in the last 168 hours. No results for input(s): AMMONIA in the last 168 hours. Coagulation Profile: No results for input(s): INR, PROTIME in the last 168 hours. Cardiac Enzymes: No results for input(s): CKTOTAL, CKMB, CKMBINDEX, TROPONINI in the last 168 hours. BNP (last 3 results) No results for input(s): PROBNP in the last 8760 hours. HbA1C: No results for input(s): HGBA1C in the last 72 hours. CBG: No results for input(s): GLUCAP in the last  168 hours. Lipid Profile: No results for input(s): CHOL, HDL, LDLCALC, TRIG, CHOLHDL, LDLDIRECT in the last 72 hours. Thyroid Function Tests: No results for input(s): TSH, T4TOTAL, FREET4, T3FREE, THYROIDAB in the last 72 hours. Anemia Panel: No results for input(s): VITAMINB12, FOLATE, FERRITIN, TIBC, IRON, RETICCTPCT in the last 72 hours. Sepsis Labs: Recent Labs  Lab 07/10/24 1323 07/11/24 0823 07/12/24 1345  LATICACIDVEN 2.3* 2.5* 1.4    Recent Results (from the past 240 hours)  Resp panel by RT-PCR (RSV, Flu A&B, Covid) Anterior Nasal Swab     Status: None   Collection Time: 07/10/24  2:27 AM   Specimen: Anterior Nasal Swab  Result Value  Ref Range Status   SARS Coronavirus 2 by RT PCR NEGATIVE NEGATIVE Final   Influenza A by PCR NEGATIVE NEGATIVE Final   Influenza B by PCR NEGATIVE NEGATIVE Final    Comment: (NOTE) The Xpert Xpress SARS-CoV-2/FLU/RSV plus assay is intended as an aid in the diagnosis of influenza from Nasopharyngeal swab specimens and should not be used as a sole basis for treatment. Nasal washings and aspirates are unacceptable for Xpert Xpress SARS-CoV-2/FLU/RSV testing.  Fact Sheet for Patients: BloggerCourse.com  Fact Sheet for Healthcare Providers: SeriousBroker.it  This test is not yet approved or cleared by the [place]  FDA and has been authorized for detection and/or diagnosis of SARS-CoV-2 by FDA under an Emergency Use Authorization (EUA). This EUA will remain in effect (meaning this test can be used) for the duration of the COVID-19 declaration under Section 564(b)(1) of the Act, 21 U.S.C. section 360bbb-3(b)(1), unless the authorization is terminated or revoked.     Resp Syncytial Virus by PCR NEGATIVE NEGATIVE Final    Comment: (NOTE) Fact Sheet for Patients: BloggerCourse.com  Fact Sheet for Healthcare  Providers: SeriousBroker.it  This test is not yet approved or cleared by the [place]  FDA and has been authorized for detection and/or diagnosis of SARS-CoV-2 by FDA under an Emergency Use Authorization (EUA). This EUA will remain in effect (meaning this test can be used) for the duration of the COVID-19 declaration under Section 564(b)(1) of the Act, 21 U.S.C. section 360bbb-3(b)(1), unless the authorization is terminated or revoked.  Performed at [facility], [address], [place], [place] [address]   Urine Culture     Status: Abnormal   Collection Time: 07/10/24  4:07 AM   Specimen: Urine, Random  Result Value Ref Range Status   Specimen Description URINE, RANDOM  Final   Special Requests   Final    NONE Reflexed from [id number] Performed at [facility], [address], [place], [place] [address]    Culture MULTIPLE SPECIES PRESENT, SUGGEST RECOLLECTION (A)  Final   Report Status 07/11/2024 FINAL  Final         Radiology Studies: No results found.         LOS: 7 days   Time spent= 35 mins    Burgess JAYSON Dare, MD [facility]  If 7PM-7AM, please contact night-coverage  07/17/2024, 11:41 AM

## 2024-07-17 NOTE — Progress Notes (Signed)
 Physical Therapy Treatment Patient Details Name: Dean Penrod Sr. MRN: [id number] DOB: 12/28/1950 Today's Date: 07/17/2024   History of Present Illness 73 y.o. M adm 07/10/24 with weakness and fluid retention. 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 Pt educated for repeated sit to stands, rollator use, progressive gait and HEP. Pt needs encouragement to be OOB and progress function despite education for progression to return home. Will continue to follow.    HR 96 SPO2 93% on RA   If plan is discharge home, recommend the following: A little help with walking and/or transfers;A little help with bathing/dressing/bathroom;Assistance with cooking/housework   Can travel by private vehicle        Equipment Recommendations  None recommended by PT    Recommendations for Other Services       Precautions / Restrictions Precautions Precautions: Fall Recall of Precautions/Restrictions: Intact     Mobility  Bed Mobility Overal bed mobility: Modified Independent             General bed mobility comments: HOB 20 degrees with rail    Transfers Overall transfer level: Modified independent   Transfers: Sit to/from Stand             General transfer comment: pt able to rise from bed and chair with reliance on UB. Pt performed 5 repeated sit to stands with UB in 25 sec withcomplete sacral contact this session and improved to additional trial of 5 in 18 sec which is gettting closer to threshold of 15 sec to demonstrate decreased fall risk    Ambulation/Gait Ambulation/Gait assistance: Supervision Gait Distance (Feet): 200 Feet Assistive device: Rollator (4 wheels) Gait Pattern/deviations: Step-through pattern,  Decreased stride length, Trunk flexed   Gait velocity interpretation: 1.31 - 2.62 ft/sec, indicative of limited community ambulator   General Gait Details: cues for posture,  to maintain hips near rollator, brake use and to control speeed. pt has a tendency to walk too quickly with rollator but required seated rest after 200' of grossly 4 min prior to return gait of safe distance   Stairs             Wheelchair Mobility     Tilt Bed    Modified Rankin (Stroke Patients Only)       Balance Overall balance assessment: Mild deficits observed, not formally tested                                          Communication Communication Communication: No apparent difficulties  Cognition Arousal: Alert Behavior During Therapy: Flat affect   PT - Cognitive impairments: No apparent impairments                         Following commands: Intact      Cueing Cueing Techniques: Verbal cues  Exercises General Exercises - Lower Extremity Long Arc Quad: AROM, Both, Seated, Strengthening, 20 reps Hip Flexion/Marching: AROM, Both, Seated, Strengthening, AAROM, 20 reps (AAROM on LLE)    General Comments        Pertinent Vitals/Pain Pain Assessment Pain Assessment: No/denies pain    Home  Living                          Prior Function            PT Goals (current goals can now be found in the care plan section) Progress towards PT goals: Progressing toward goals    Frequency    Min 2X/week      PT Plan      Co-evaluation              AM-PAC PT 6 Clicks Mobility   Outcome Measure  Help needed turning from your back to your side while in a flat bed without using bedrails?: None Help needed moving from lying on your back to sitting on the side of a flat bed without using bedrails?: None Help needed moving to and from a bed to a chair (including a wheelchair)?: None Help needed standing up from a chair using your arms  (e.g., wheelchair or bedside chair)?: None Help needed to walk in hospital room?: A Little Help needed climbing 3-5 steps with a railing? : A Little 6 Click Score: 22    End of Session   Activity Tolerance: Patient tolerated treatment well Patient left: in chair;with call bell/phone within reach;with chair alarm set;with nursing/sitter in room Nurse Communication: Mobility status PT Visit Diagnosis: Other abnormalities of gait and mobility (R26.89);Difficulty in walking, not elsewhere classified (R26.2);Muscle weakness (generalized) (M62.81)     Time: 9041-8970 PT Time Calculation (min) (ACUTE ONLY): 31 min  Charges:    $Gait Training: 8-22 mins $Therapeutic Exercise: 8-22 mins PT General Charges $$ ACUTE PT VISIT: 1 Visit                     Lenoard SQUIBB, PT Acute Rehabilitation Services Office: [phone]    Alishah Schulte B Cecily Lawhorne 07/17/2024, 11:12 AM

## 2024-07-18 DIAGNOSIS — Z7189 Other specified counseling: Secondary | ICD-10-CM

## 2024-07-18 DIAGNOSIS — R600 Localized edema: Secondary | ICD-10-CM | POA: Diagnosis not present

## 2024-07-18 DIAGNOSIS — T733XXA Exhaustion due to excessive exertion, initial encounter: Secondary | ICD-10-CM | POA: Diagnosis not present

## 2024-07-18 DIAGNOSIS — Z66 Do not resuscitate: Secondary | ICD-10-CM

## 2024-07-18 DIAGNOSIS — Z515 Encounter for palliative care: Secondary | ICD-10-CM

## 2024-07-18 DIAGNOSIS — I5043 Acute on chronic combined systolic (congestive) and diastolic (congestive) heart failure: Secondary | ICD-10-CM | POA: Diagnosis not present

## 2024-07-18 LAB — AFP TUMOR MARKER: AFP, Serum, Tumor Marker: 2.2 ng/mL (ref 0.0–8.4)

## 2024-07-18 LAB — COOXEMETRY PANEL
Carboxyhemoglobin: 3.6 % — ABNORMAL HIGH (ref 0.5–1.5)
Methemoglobin: <0.7 % (ref 0.0–1.5)
O2 Saturation: 68.4 %
Total hemoglobin: 10 g/dL — ABNORMAL LOW (ref 12.0–16.0)

## 2024-07-18 LAB — BASIC METABOLIC PANEL WITH GFR
Anion gap: 12 (ref 5–15)
BUN: 82 mg/dL — ABNORMAL HIGH (ref 8–23)
CO2: 33 mmol/L — ABNORMAL HIGH (ref 22–32)
Calcium: 10 mg/dL (ref 8.9–10.3)
Chloride: 85 mmol/L — ABNORMAL LOW (ref 98–111)
Creatinine, Ser: 2.41 mg/dL — ABNORMAL HIGH (ref 0.61–1.24)
GFR, Estimated: 27 mL/min — ABNORMAL LOW (ref >60)
Glucose, Bld: 98 mg/dL (ref 70–99)
Potassium: 3.8 mmol/L (ref 3.5–5.1)
Sodium: 130 mmol/L — ABNORMAL LOW (ref 135–145)

## 2024-07-18 LAB — MAGNESIUM: Magnesium: 2.2 mg/dL (ref 1.7–2.4)

## 2024-07-18 MED ORDER — TORSEMIDE 20 MG PO TABS: 40.0000 mg | ORAL_TABLET | Freq: Every day | ORAL | Status: DC

## 2024-07-18 MED ORDER — ISOSORBIDE MONONITRATE ER 30 MG PO TB24
30.0000 mg | ORAL_TABLET | Freq: Every day | ORAL | Status: DC
  Administered 2024-07-18 – 2024-07-19 (×2): 30 mg via ORAL
  Filled 2024-07-18: qty 1

## 2024-07-18 MED ORDER — POTASSIUM CHLORIDE CRYS ER 20 MEQ PO TBCR
20.0000 meq | EXTENDED_RELEASE_TABLET | Freq: Once | ORAL | Status: AC
  Administered 2024-07-18: 20 meq via ORAL
  Filled 2024-07-18: qty 1

## 2024-07-18 MED ORDER — BENZONATATE 100 MG PO CAPS
100.0000 mg | ORAL_CAPSULE | Freq: Once | ORAL | Status: AC | PRN
  Administered 2024-07-18: 100 mg via ORAL
  Filled 2024-07-18: qty 1

## 2024-07-18 NOTE — Progress Notes (Signed)
 Mobility Specialist Progress Note:    07/18/24 1459  Mobility  Activity Turned to back - supine (Ankle Pumps, Leg Lifts, Heel Slides)  Level of Assistance Standby assist, set-up cues, supervision of patient - no hands on  Assistive Device None  Range of Motion/Exercises Left leg;Right leg;Active  Activity Response Tolerated well  Mobility Referral Yes  Mobility visit 1 Mobility  Mobility Specialist Start Time (ACUTE ONLY) 1459  Mobility Specialist Stop Time (ACUTE ONLY) 1510  Mobility Specialist Time Calculation (min) (ACUTE ONLY) 11 min   Received pt laying in bed. Decided to do bed exercises w/ pt since PT was planning activity for the afternoon. Pt agreeable. Pt moving and performing exercises well. No c/o any symptoms. Left pt in bed w/ all needs met.  Venetia Keel Mobility Specialist Please Neurosurgeon or Rehab Office at [phone]

## 2024-07-18 NOTE — Progress Notes (Addendum)
 Patient ID: Dean Gervasi Sr., male   DOB: 04-24-1951, 73 y.o.   MRN: [id number]     Advanced Heart Failure Rounding Note  Cardiologist: Vinie JAYSON Maxcy, MD  HF Cardiologist: Dr. [name]  Chief Complaint: CHF  Subjective:    CO-OX 68% on 0.125 milrinone .  CVP 10. Down 2 more pounds. Reports his weight is the lowest it has been in years.   Tearful today. Has questions about his prognosis.  Objective:   Weight Range: 82.7 kg Body mass index is 29.43 kg/m.   Vital Signs:   Temp:  [97.6 F (36.4 C)-98.3 F (36.8 C)] 97.6 F (36.4 C) (10/24 0714) Pulse Rate:  [69-96] 69 (10/24 0714) Resp:  [18-20] 18 (10/24 0714) BP: (103-133)/(35-74) 130/46 (10/24 0714) SpO2:  [93 %-100 %] 100 % (10/24 0714) Weight:  [82.7 kg] 82.7 kg (10/24 0500) Last BM Date : 07/17/24  Weight change: Filed Weights   07/17/24 0309 07/17/24 0506 07/18/24 0500  Weight: 86.4 kg 83.9 kg 82.7 kg    Intake/Output:   Intake/Output Summary (Last 24 hours) at 07/18/2024 0759 Last data filed at 07/18/2024 0630 Gross per 24 hour  Intake 610.19 ml  Output 1800 ml  Net -1189.81 ml    Physical Exam    General:  Elderly male sitting up in chair. Cor: JVP 10-12. Regular rate & rhythm. No murmurs. Lungs: clear Abdomen: soft, nontender, nondistended.  Extremities: 1= edema, + UNNA Neuro: alert & orientedx3. Affect depressed    Telemetry   SR 70s  Labs    CBC No results for input(s): WBC, NEUTROABS, HGB, HCT, MCV, PLT in the last 72 hours.  Basic Metabolic Panel Recent Labs    89/76/74 0455 07/18/24 0500  NA 130* 130*  K 3.6 3.8  CL 86* 85*  CO2 33* 33*  GLUCOSE 95 98  BUN 78* 82*  CREATININE 2.29* 2.41*  CALCIUM  9.9 10.0  MG 1.8 2.2  PHOS 1.8*  --    Liver Function Tests No results for input(s): AST, ALT, ALKPHOS, BILITOT, PROT, ALBUMIN  in the last 72 hours.  No results for input(s): LIPASE, AMYLASE in the last 72 hours. Cardiac Enzymes No results for  input(s): CKTOTAL, CKMB, CKMBINDEX, TROPONINI in the last 72 hours.  BNP: BNP (last 3 results) Recent Labs    05/16/24 1308 06/25/24 1604 07/10/24 0227  BNP 618.2* 1,585.3* 1,952.1*    ProBNP (last 3 results) No results for input(s): PROBNP in the last 8760 hours.   D-Dimer No results for input(s): DDIMER in the last 72 hours. Hemoglobin A1C No results for input(s): HGBA1C in the last 72 hours. Fasting Lipid Panel No results for input(s): CHOL, HDL, LDLCALC, TRIG, CHOLHDL, LDLDIRECT in the last 72 hours. Thyroid Function Tests No results for input(s): TSH, T4TOTAL, T3FREE, THYROIDAB in the last 72 hours.  Invalid input(s): FREET3  Other results:   Imaging    No results found.    Medications:     Scheduled Medications:  amiodarone   200 mg Oral Daily   apixaban   5 mg Oral BID   atorvastatin   40 mg Oral Daily   Chlorhexidine  Gluconate Cloth  6 each Topical Daily   feeding supplement  237 mL Oral BID BM   ferrous sulfate   325 mg Oral Daily   hydrALAZINE   10 mg Oral Q8H   isosorbide  mononitrate  15 mg Oral Daily   multivitamin with minerals  1 tablet Oral Daily   sodium chloride  flush  10-40 mL Intracatheter Q12H   sodium  chloride flush  3 mL Intravenous Q12H   torsemide  60 mg Oral Daily    Infusions:  milrinone  0.125 mcg/kg/min (07/18/24 0500)    PRN Medications: acetaminophen , melatonin, ondansetron  (ZOFRAN ) IV, mouth rinse, oxyCODONE , polyethylene glycol, sodium chloride  flush, sodium chloride  flush    Patient Profile   73 y/o male w/ chronic systolic heart failure d/t ICM, CAD, MR, PAF, H/o VT/VF s/p ICD and CKD IIIb admitted w/ a/c CHF and AKI.   Assessment/Plan   Chronic systolic heart failure: Primarily ischemic, prior history of infarcted RCA.   - Most recent 2D echo 8/25 EF 35-40%, RV mildly reduced, moderate-severe MR.  - NYHA IIIb w/ volume overload - Sluggish response to IV lasix , PICC placed with  elevated CVP and low co-ox.  He was started on milrinone , now on 0.25.   - CO-OX 68% on 0.125 milrinone . Will stop today.  - CVP 10. Increase Torsemide to 60 q am, 40 q pm. - CKD limits MRA and RAASi - Continue imdur  10 TID + Imdur  15 mg daily. Would like to titrate further but he is very concerned about side effects. He was intolerant of hydralazine  in the past but at a much higher dose. We are very limited in what we can use for GDMT.  - No SGLT-2 with recent UTI - no ? blocker w/ concern for low output  - UNNA boots - ICD in place. - We had a long discussion about his trajectory and expected prognosis. He's had 2 admissions requiring inotrope support. 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 He is interested in meeting with Palliative Care, consult placed.   Mitral regurgitation: 2/2 ischemic disease.  TEE 9/25 with more moderate MR by PISA, valve area, systolic blunting of pulmonary veins. 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 Scr trended up to 2.7, now improving with inotrope support and diuresis. 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 Continue statin   Abnormal Chest CT  - posterior right pulmonary nodule is new since prior study + possible cirrhosis + hepatic lesions concerning for ? 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  Length of Stay: 8  FINCH, LINDSAY N, PA-C  07/18/2024, 7:59 AM  Advanced Heart Failure Team Pager [phone] (M-F; 7a - 5p)  Please contact [facility] for night-coverage after hours (5p -7a ) and weekends on [email or web address]  Patient seen with PA, I formulated the plan and agree with the above note.   No dyspnea, overall feels ok.  Creatinine 2.29 => 2.41.  Weight down 2 more lbs.  Co-ox 68% on milrinone  0.125. CVP on my read was 7.   General: NAD Neck: No JVD, no thyromegaly or  thyroid nodule.  Lungs: Clear to auscultation bilaterally with normal respiratory effort. CV: Lateral PMI.  Heart regular S1/S2, no S3/S4, 1/6 HSM apex.  1+ ankle edema.  Abdomen: Soft, nontender, no hepatosplenomegaly, no distention.  Skin: Intact without lesions or rashes.  Neurologic: Alert and oriented x 3.  Psych: Normal affect. Extremities: No clubbing or cyanosis.  HEENT: Normal.   I will stop milrinone  today with good co-ox.  We are limited in terms of GDMT with CKD stage IV.  He is very reluctant to increase hydralazine , so will keep hydralazine  at 10 mg tid and increase Imdur  to 30 mg daily.   On my read, CVP is 7.  With mild rise in creatinine, will keep torsemide at 60 mg daily.    He remains in NSR, continue apixaban  and amiodarone .   If he tolerates stopping milrinone , could go home tomorrow.  Ezra Shuck 07/18/2024 9:14 AM

## 2024-07-18 NOTE — Progress Notes (Signed)
 Nutrition Follow-up  DOCUMENTATION CODES:   Non-severe (moderate) malnutrition in context of chronic illness  INTERVENTION:  Replace phosphorus. Continue heart-healthy diet with 1.5 L fluid restriction. Continue Ensure Plus High Protein po BID, each supplement provides 350 kcal and 20 grams of protein. Continue daily multivitamin.  NUTRITION DIAGNOSIS:   Moderate Malnutrition related to chronic illness as evidenced by energy intake < 75% for > or equal to 1 month, mild fat depletion, mild muscle depletion.   GOAL:   Patient will meet greater than or equal to 90% of their needs   MONITOR:   PO intake, Supplement acceptance  REASON FOR ASSESSMENT:   Follow-up for: Consult Diet education  ASSESSMENT:   medical history significant of HFrEF, VT s/p AICD, PAF, hyperlipidemia, CKD IV and T2DM presents with weakness and fluid retention after taking isosorbide  and hydralazine .  10/17 - low-sodium and fluid restriction diet ed provided  Update: visited the patient who states that he has an average appetite. He is eating 50-75% of his meals. He loves Ensure and has been drinking two a day.   Scheduled Meds:  amiodarone   200 mg Oral Daily   apixaban   5 mg Oral BID   atorvastatin   40 mg Oral Daily   Chlorhexidine  Gluconate Cloth  6 each Topical Daily   feeding supplement  237 mL Oral BID BM   ferrous sulfate   325 mg Oral Daily   hydrALAZINE   10 mg Oral Q8H   isosorbide  mononitrate  30 mg Oral Daily   multivitamin with minerals  1 tablet Oral Daily   sodium chloride  flush  10-40 mL Intracatheter Q12H   sodium chloride  flush  3 mL Intravenous Q12H   torsemide  60 mg Oral Daily   Labs:  Phos 1.8   I/O: -9.5 L since admit  Meal Intake: 50-75% per patient, 80-100% charted    NUTRITION - FOCUSED PHYSICAL EXAM:  Flowsheet Row Most Recent Value  Orbital Region No depletion  Upper Arm Region Moderate depletion  Thoracic and Lumbar Region Mild depletion  Buccal Region  No depletion  Temple Region No depletion  Clavicle Bone Region Mild depletion  Clavicle and Acromion Bone Region Mild depletion  Scapular Bone Region No depletion  Patellar Region Unable to assess  [edema]  Anterior Thigh Region Mild depletion  Posterior Calf Region Unable to assess  [edema]  Edema (RD Assessment) Moderate  [+2 BLE]  Hair Reviewed  Eyes Reviewed  Mouth Reviewed  Skin Reviewed  Nails Reviewed    Diet Order             Diet Heart Room service appropriate? Yes; Fluid consistency: Thin; Fluid restriction: 1500 mL Fluid  Diet effective now                 Ensure+HP BID  EDUCATION NEEDS:   Education needs have been addressed  Skin:  Skin Assessment: Reviewed RN Assessment  Last BM:  10/23 type 3  Height:   Ht Readings from Last 1 Encounters:  07/10/24 5' 6 (1.676 m)    Weight:   Weights this admission:   Ideal Body Weight:  72.7 kg  BMI:  Body mass index is 29.43 kg/m.  Estimated Nutritional Needs:   Kcal:  1800-2200 kcals  Protein:  87-109 grams  Fluid:  </= 1.5L/d    Leverne Ruth, MS, RDN, LDN [place] See AMION for contact information

## 2024-07-18 NOTE — Plan of Care (Signed)
  Problem: Education: Goal: Knowledge of General Education information will improve Description: Including pain rating scale, medication(s)/side effects and non-pharmacologic comfort measures Outcome: Progressing   Problem: Clinical Measurements: Goal: Ability to maintain clinical measurements within normal limits will improve Outcome: Progressing   Problem: Clinical Measurements: Goal: Will remain free from infection Outcome: Progressing   Problem: Clinical Measurements: Goal: Cardiovascular complication will be avoided Outcome: Progressing   Problem: Activity: Goal: Risk for activity intolerance will decrease Outcome: Progressing   Problem: Pain Managment: Goal: General experience of comfort will improve and/or be controlled Outcome: Progressing   Problem: Safety: Goal: Ability to remain free from injury will improve Outcome: Progressing   Problem: Skin Integrity: Goal: Risk for impaired skin integrity will decrease Outcome: Progressing

## 2024-07-18 NOTE — Progress Notes (Signed)
 Physical Therapy Treatment Patient Details Name: Dean Engen Sr. MRN: [id number] DOB: 1950/12/01 Today's Date: 07/18/2024   History of Present Illness 73 y.o. M adm 07/10/24 with weakness and fluid retention. PMHx: HLD, HFrEF, ICD, PAF on Eliquis , HTN, CAD, HLD, OSA, T2DM, CKD, HTN, laminectomy    PT Comments  Pt received in supine, pt agreeable to therapy session with encouragement on third attempt (pt on telephone for first 2 attempts), pt c/o fatigue. Pt agreeable to stair negotiation for BLE strengthening via 7.5 platform in room, able to perform x14 reps with bil rails to simulate stairs at home, pt fatigued and needed seated break after final rep. Pt modI for bed mobility using rail from flat bed and Supervision for safety cues at most with transfers, otherwise modI. Pt continues to benefit from PT services to progress toward functional mobility goals, continue to recommend HHPT.     If plan is discharge home, recommend the following: A little help with walking and/or transfers;A little help with bathing/dressing/bathroom;Assistance with cooking/housework   Can travel by private vehicle        Equipment Recommendations  None recommended by PT    Recommendations for Other Services       Precautions / Restrictions Precautions Precautions: Fall Recall of Precautions/Restrictions: Intact Precaution/Restrictions Comments: bil pedal edema Restrictions Weight Bearing Restrictions Per Provider Order: No     Mobility  Bed Mobility Overal bed mobility: Modified Independent             General bed mobility comments: HOB flat, pt using rail    Transfers Overall transfer level: Needs assistance Equipment used: Rolling walker (2 wheels), None Transfers: Sit to/from Stand Sit to Stand: Supervision           General transfer comment: STS from EOB modI, able to stand without AD but needs reminders to reach back for bed surface prior to sitting to ensure proximity     Ambulation/Gait Ambulation/Gait assistance: Supervision Gait Distance (Feet): 10 Feet           General Gait Details: short distance in front of bed to get to/from step for stair negotiation, and sidesteps to [employer]   Stairs Stairs: Yes Stairs assistance: Contact guard assist Stair Management: Alternating pattern, Forwards, Two rails Number of Stairs: 14 General stair comments: 7.5 step in room x14 reps up/down, pt mostly ascending with RLE and descending LLE but at times changing pattern and no increased instability when leading with LLE. Increased time to perform, with a couple standing rest breaks, pt has 14 steps to second floor and able to complete all 14 today. Very fatigued after simulated flight of stairs, pt requesting to rest back in bed.   Wheelchair Mobility     Tilt Bed    Modified Rankin (Stroke Patients Only)       Balance Overall balance assessment: Mild deficits observed, not formally tested                                          Communication Communication Communication: No apparent difficulties  Cognition Arousal: Alert Behavior During Therapy: Flat affect   PT - Cognitive impairments: No apparent impairments                         Following commands: Intact      Cueing Cueing Techniques: Verbal cues, Gestural cues  Exercises  Other Exercises Other Exercises: supine BLE AROM: ankle pumps x10 reps, IS x10 reps pt achieves 700-800 mL Other Exercises: step-ups on 7.5 platform x10 reps for BLE strengthening    General Comments General comments (skin integrity, edema, etc.): bil pedal edema, PTA reviewed keeping LE elevated at rest and heels floated with pillow under calves, pt encouraged to perform ankle pumps hourly      Pertinent Vitals/Pain Pain Assessment Pain Assessment: Faces Faces Pain Scale: Hurts a little bit Pain Location: lower back Pain Descriptors / Indicators: Discomfort Pain Intervention(s):  Limited activity within patient's tolerance, Monitored during session, Repositioned    Home Living                          Prior Function            PT Goals (current goals can now be found in the care plan section) Acute Rehab PT Goals Patient Stated Goal: return to home and work PT Goal Formulation: With patient Time For Goal Achievement: 07/25/24 Progress towards PT goals: Progressing toward goals    Frequency    Min 2X/week      PT Plan      Co-evaluation              AM-PAC PT 6 Clicks Mobility   Outcome Measure  Help needed turning from your back to your side while in a flat bed without using bedrails?: None Help needed moving from lying on your back to sitting on the side of a flat bed without using bedrails?: A Little (pulling up on bed rail) Help needed moving to and from a bed to a chair (including a wheelchair)?: A Little Help needed standing up from a chair using your arms (e.g., wheelchair or bedside chair)?: A Little Help needed to walk in hospital room?: A Little Help needed climbing 3-5 steps with a railing? : A Little 6 Click Score: 19    End of Session Equipment Utilized During Treatment: Gait belt Activity Tolerance: Patient tolerated treatment well;Patient limited by fatigue Patient left: in bed;with call bell/phone within reach;with bed alarm set;Other (comment) (heels floated; pt defers OOB to chair) Nurse Communication: Mobility status PT Visit Diagnosis: Other abnormalities of gait and mobility (R26.89);Difficulty in walking, not elsewhere classified (R26.2);Muscle weakness (generalized) (M62.81)     Time: 8399-8383 PT Time Calculation (min) (ACUTE ONLY): 16 min  Charges:    $Therapeutic Exercise: 8-22 mins PT General Charges $$ ACUTE PT VISIT: 1 Visit                     Aleni Andrus P., PTA Acute Rehabilitation Services Secure Chat Preferred 9a-5:30pm Office: [phone]    Connell HERO [facility] 07/18/2024, 4:41 PM

## 2024-07-18 NOTE — Progress Notes (Signed)
 PROGRESS NOTE    Dean Croft Sr.  FMW:[id number] DOB: 01/22/1951 DOA: 07/10/2024 PCP: [name], MD    Brief Narrative:   73 y.o. male with medical history significant of HFrEF EF 40%, VT s/p AICD, PAF, hyperlipidemia, CKD IV and T2DM presents with weakness and fluid retention, found to have exacerbation of HFrEF.  CHF team is following for diuretics.  Patient has a PICC line in place and renal function is currently fluctuating due to aggressive diuretics.  CT chest without contrast did show concerns of liver lesion/cirrhosis and pulmonary nodules.  Acute hepatitis panel negative, AFP is pending.  There is concerns that this could be hemangioma as seen on ultrasound in July 2025 therefore will defer further workup as outpatient.  Assessment & Plan:   Acute exacerbation of HFrEF/moderate mitral regurgitation, EF 35% - Last noted EF 35-40% with grade 2 DD.  History of previous hospitalization requiring inotropic support for diuresis.  Unknown wounds.  Cardiology/heart failure following closely.  PICC line in place, wean off milrinone .  Diuretic management per CHF team, planning on transitioning to p.o.   Acute kidney injury on CKD 3B - Baseline creatinine 2.1,Currently fluctuating due to aggressive diuretics.    Paroxysmal atrial fibrillation - Continue amiodarone  and Eliquis .  Currently NSR.  HypoKalemia/hypophosphatemia - Repletion   History of VT s/p ICD - Amiodarone    Diabetes mellitus type II - Insulin  sliding scale   Anemia of chronic disease - No active bleeding.  Hemoglobin stable @10    Possible cirrhosis with new liver lesions Pulmonary nodules -This is seen on the CT of the chest without contrast but no liver lesions were noted on the CT abdomen pelvis back in August.   Acute Hep panel is neg. AFP is pending.  Ultrasound in July has shown hemangioma.  Will defer outpatient follow-up regarding this.  Unable to do MRCP due to his renal issues at this  time. -Daughter, son and patient is aware of this plan   Physical debilitation muscle weakness - PT/OT on board.  Recommending home health.  Face-to-face completed   DVT prophylaxis: Eliquis     Code Status: Full Code Family Communication:   Status is: Inpatient Remains inpatient appropriate because: Ongoing management for CHF   PT Follow up Recs: Home Health Pt10/21/2025 1111, face-to-face completed  Subjective: Seen at bedside sitting up in the recliner. He tells me that he thinks he had some hemangioma on the ultrasound  Examination:  General exam: Appears calm and comfortable  Respiratory system: Clear to auscultation. Respiratory effort normal. Cardiovascular system: S1 & S2 heard, RRR. No JVD, murmurs, rubs, gallops or clicks. No pedal edema. Gastrointestinal system: Abdomen is nondistended, soft and nontender. No organomegaly or masses felt. Normal bowel sounds heard. Central nervous system: Alert and oriented. No focal neurological deficits. Extremities: Symmetric 5 x 5 power. Skin: No rashes, lesions or ulcers Psychiatry: Judgement and insight appear normal. Mood & affect appropriate.                Diet Orders (From admission, onward)     Start     Ordered   07/10/24 1741  Diet Heart Room service appropriate? Yes; Fluid consistency: Thin; Fluid restriction: 1500 mL Fluid  Diet effective now       Question Answer Comment  Room service appropriate? Yes   Fluid consistency: Thin   Fluid restriction: 1500 mL Fluid      07/10/24 1740            Objective: Vitals:  07/17/24 2345 07/18/24 0444 07/18/24 0500 07/18/24 0714  BP: (!) 133/38 (!) 132/51  (!) 130/46  Pulse: 72 71  69  Resp: 19 18  18   Temp: 98.3 F (36.8 C) 97.8 F (36.6 C)  97.6 F (36.4 C)  TempSrc: Oral Oral  Oral  SpO2: 96% 93%  100%  Weight:   82.7 kg   Height:        Intake/Output Summary (Last 24 hours) at 07/18/2024 1104 Last data filed at 07/18/2024 9170 Gross per 24  hour  Intake 957.19 ml  Output 1800 ml  Net -842.81 ml   Filed Weights   07/17/24 0309 07/17/24 0506 07/18/24 0500  Weight: 86.4 kg 83.9 kg 82.7 kg    Scheduled Meds:  amiodarone   200 mg Oral Daily   apixaban   5 mg Oral BID   atorvastatin   40 mg Oral Daily   Chlorhexidine  Gluconate Cloth  6 each Topical Daily   feeding supplement  237 mL Oral BID BM   ferrous sulfate   325 mg Oral Daily   hydrALAZINE   10 mg Oral Q8H   isosorbide  mononitrate  30 mg Oral Daily   multivitamin with minerals  1 tablet Oral Daily   sodium chloride  flush  10-40 mL Intracatheter Q12H   sodium chloride  flush  3 mL Intravenous Q12H   torsemide  60 mg Oral Daily   Continuous Infusions:  Nutritional status Signs/Symptoms: energy intake < 75% for > or equal to 1 month, mild fat depletion, mild muscle depletion Interventions: Ensure Enlive (each supplement provides 350kcal and 20 grams of protein), Education Body mass index is 29.43 kg/m.  Data Reviewed:   CBC: Recent Labs  Lab 07/12/24 0500  WBC 8.9  HGB 10.4*  HCT 31.9*  MCV 91.9  PLT 108*   Basic Metabolic Panel: Recent Labs  Lab 07/14/24 0543 07/15/24 0621 07/16/24 0439 07/17/24 0455 07/18/24 0500  NA 129* 129* 130* 130* 130*  K 4.0 3.6 3.3* 3.6 3.8  CL 94* 90* 89* 86* 85*  CO2 26 30 31  33* 33*  GLUCOSE 99 96 109* 95 98  BUN 64* 69* 76* 78* 82*  CREATININE 2.03* 2.50* 2.32* 2.29* 2.41*  CALCIUM  10.1 10.2 10.3 9.9 10.0  MG 2.0 2.1 2.1 1.8 2.2  PHOS  --   --   --  1.8*  --    GFR: Estimated Creatinine Clearance: 27.2 mL/min (A) (by C-G formula based on SCr of 2.41 mg/dL (H)). Liver Function Tests: No results for input(s): AST, ALT, ALKPHOS, BILITOT, PROT, ALBUMIN  in the last 168 hours. No results for input(s): LIPASE, AMYLASE in the last 168 hours. No results for input(s): AMMONIA in the last 168 hours. Coagulation Profile: No results for input(s): INR, PROTIME in the last 168 hours. Cardiac Enzymes: No  results for input(s): CKTOTAL, CKMB, CKMBINDEX, TROPONINI in the last 168 hours. BNP (last 3 results) No results for input(s): PROBNP in the last 8760 hours. HbA1C: No results for input(s): HGBA1C in the last 72 hours. CBG: No results for input(s): GLUCAP in the last 168 hours. Lipid Profile: No results for input(s): CHOL, HDL, LDLCALC, TRIG, CHOLHDL, LDLDIRECT in the last 72 hours. Thyroid Function Tests: No results for input(s): TSH, T4TOTAL, FREET4, T3FREE, THYROIDAB in the last 72 hours. Anemia Panel: No results for input(s): VITAMINB12, FOLATE, FERRITIN, TIBC, IRON, RETICCTPCT in the last 72 hours. Sepsis Labs: Recent Labs  Lab 07/12/24 1345  LATICACIDVEN 1.4    Recent Results (from the past 240 hours)  Resp  panel by RT-PCR (RSV, Flu A&B, Covid) Anterior Nasal Swab     Status: None   Collection Time: 07/10/24  2:27 AM   Specimen: Anterior Nasal Swab  Result Value Ref Range Status   SARS Coronavirus 2 by RT PCR NEGATIVE NEGATIVE Final   Influenza A by PCR NEGATIVE NEGATIVE Final   Influenza B by PCR NEGATIVE NEGATIVE Final    Comment: (NOTE) The Xpert Xpress SARS-CoV-2/FLU/RSV plus assay is intended as an aid in the diagnosis of influenza from Nasopharyngeal swab specimens and should not be used as a sole basis for treatment. Nasal washings and aspirates are unacceptable for Xpert Xpress SARS-CoV-2/FLU/RSV testing.  Fact Sheet for Patients: BloggerCourse.com  Fact Sheet for Healthcare Providers: SeriousBroker.it  This test is not yet approved or cleared by the [place]  FDA and has been authorized for detection and/or diagnosis of SARS-CoV-2 by FDA under an Emergency Use Authorization (EUA). This EUA will remain in effect (meaning this test can be used) for the duration of the COVID-19 declaration under Section 564(b)(1) of the Act, 21 U.S.C. section 360bbb-3(b)(1),  unless the authorization is terminated or revoked.     Resp Syncytial Virus by PCR NEGATIVE NEGATIVE Final    Comment: (NOTE) Fact Sheet for Patients: BloggerCourse.com  Fact Sheet for Healthcare Providers: SeriousBroker.it  This test is not yet approved or cleared by the [place]  FDA and has been authorized for detection and/or diagnosis of SARS-CoV-2 by FDA under an Emergency Use Authorization (EUA). This EUA will remain in effect (meaning this test can be used) for the duration of the COVID-19 declaration under Section 564(b)(1) of the Act, 21 U.S.C. section 360bbb-3(b)(1), unless the authorization is terminated or revoked.  Performed at [facility], [address] [place], [place] [address]   Urine Culture     Status: Abnormal   Collection Time: 07/10/24  4:07 AM   Specimen: Urine, Random  Result Value Ref Range Status   Specimen Description URINE, RANDOM  Final   Special Requests   Final    NONE Reflexed from [id number] Performed at [facility], [address], [place], [place] [address]    Culture MULTIPLE SPECIES PRESENT, SUGGEST RECOLLECTION (A)  Final   Report Status 07/11/2024 FINAL  Final         Radiology Studies: No results found.         LOS: 8 days   Time spent= 35 mins    Burgess JAYSON Dare, MD [facility]  If 7PM-7AM, please contact night-coverage  07/18/2024, 11:04 AM

## 2024-07-18 NOTE — Consult Note (Signed)
 Consultation Note Date: 07/18/2024   Patient Name: Dean Vowels Sr.  DOB: May 06, 1951  MRN: [id number]  Age / Sex: 73 y.o., male  PCP: [name], MD Referring Physician: Caleen Burgess BROCKS, MD  Reason for Consultation: Establishing goals of care  HPI/Patient Profile: 73 y.o. male  with past medical history of  HFrEF, VT s/p AICD, PAF, hyperlipidemia, CKD IV and T2DM  admitted on 07/10/2024 with weakness and fluid retention after taking isosorbide  and hydralazine  . History of previous hospitalization requiring inotropic support for diuresis.   Note for 3 in-patient admissions, 4 ED visits in the last 6 months.    PMT has been consulted to assist with goals of care conversation. Patient/Family face treatment option decisions, advanced directive decisions and anticipatory care needs.   Family face treatment option decision, advance directive decisions and anticipatory care needs.   Clinical Assessment and Goals of Care:  I have reviewed medical records including EPIC notes, labs and imaging, assessed the patient and then met with the patient to discuss diagnosis prognosis, GOC, EOL wishes, disposition and options.  I introduced Palliative Medicine as specialized medical care for people living with serious illness. It focuses on providing relief from the symptoms and stress of a serious illness. The goal is to improve quality of life for both the patient and the family.  Visited the patient at bedside today. No family members were present during the encounter. The patient was observed sitting in a recliner, alert, oriented, and in no apparent distress. He was able to communicate effectively, make his needs known, and denied any pain or discomfort.  The patient demonstrated a clear understanding of his acute illness, chronic medical conditions, and the reasons for his current hospitalization. He reported seeking medical attention due to progressive weakness and  fluid retention over the past few days. He acknowledged his history of heart failure, atrial fibrillation, and mitral regurgitation, and understood that he is currently being treated with diuretics and milrinone  to support fluid removal. He is also aware of his comorbidities, including diabetes mellitus and chronic kidney disease. We discussed the seriousness of his condition, including the impact of advanced age, frequent hospitalizations, and overall disease burden.  Education was provided regarding the expected disease trajectory, emphasizing the high risk of decompensation despite ongoing medical interventions. I explained that the course of illness is likely to be marked by challenges and potential setbacks due to the progressive nature of his conditions and the limitations of current therapies. The patient acknowledged this information and expressed concern about future suffering, stating, "I do not want to suffer a long time."  We explored his goals of care. 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 He spoke about building a church and expressed hope to recover so he can complete Nurse, mental health. He also shared his desire to witness his children grow in faith, saying, "My hope is for me to witness my children mature to the same level of putting God first." When asked about his wishes should his condition worsen and medical interventions become limited, he expressed a desire to avoid prolonged suffering.  We reviewed his current full code status. I encouraged him to consider a DNR/DNI status, explaining the poor outcomes associated with resuscitation in patients with advanced chronic illness, and clarified that DNR/DNI does not alter ongoing medical care but serves to prevent potentially harmful interventions at the end of life. The patient opted to change his code status to DNR-Limited. He expressed a desire  for the medical team to continue treating reversible  conditions in hopes of improvement. We briefly discussed comfort-focused care and hospice, but the patient was not ready to engage in that conversation, citing negative past experiences with hospice involving family members.  Following the visit, I contacted the patient's daughter, Temeca, to provide a medical update and inform her of the patient's decision to change his code status to DNR-Limited. She expressed appreciation for the update.    Created space and opportunity for family to explore thoughts and feelings regarding patient's current medical condition.   The difference between aggressive medical intervention and comfort care was considered in light of the patient's goals of care. Hospice and Palliative Care services outpatient were explained and offered.   Discussed the importance of continued conversation with family and the medical providers regarding overall plan of care and treatment options, ensuring decisions are within the context of the patient's values and GOCs.   Questions and concerns were addressed.  Hard Choices booklet left for review. The family was encouraged to call with questions or concerns.  PMT will continue to support holistically.   Social History: The patient lives with his two daughters, who reside with him. He describes himself as active and engaged. His wife passed away in 07/22/2017. He has five children and ten grandchildren. 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  Functional and Nutritional State: At baseline he is ambulatory without the use of any assistive device.  Palliative Symptoms: Generalized weakness, occasional shortness of breath.   Advance Directives: A detailed discussion regarding advanced directives was had. Patient currently does not have an advance directive. I did share the importance of having one, and offered  to complete AD while in the hospital through the support from out Chaplain.    Code Status: Changed from full code to DNR-Limited   Primary Decision Maker: Patient    SUMMARY OF RECOMMENDATIONS    Code Status: Changed from Full Code to DNR-Limited, DNR form completed. Goal of care is medical stabilization and recovery to the extent this is possible Continue to provide psycho-social and emotional support to patient and family Palliative medicine team will continue to follow.   Symptom Management: Per Primary team Palliative medicine is available to assist as needed.    Palliative Prophylaxis:  Bowel Regimen, Frequent Pain Assessment, Oral Care, and Turn Reposition   Prognosis:  Prognosis is guarded due to the patient's cumulative disease burden. The pattern of recurrent hospitalizations and decline in health status underscores a high risk for further decompensation and poor overall outcomes.  Discharge Planning: To Be Determined      Primary Diagnoses: Present on Admission:  Paroxysmal atrial fibrillation (HCC)  HFrEF (heart failure with reduced ejection fraction) (HCC)  Essential hypertension  Acute kidney injury superimposed on chronic kidney disease  ICD (implantable cardioverter-defibrillator) in place  Thrombocytopenia  CAD (coronary artery disease)  Anemia of chronic disease  Abnormal CT of the chest  Physical Examination:   General:  Elderly male sitting up in chair. Cardio: HR 69 BPM Lungs: clear Abdomen: soft, lax and nontender Extremities: UNNA boot in place. Neuro: alert & orientedx3. Affect depressed    Vital Signs: BP (!) 130/46 (BP Location: Left Arm)   Pulse 69   Temp 97.6 F (36.4 C) (Oral)   Resp 18   Ht 5' 6 (1.676 m)  Wt 82.7 kg   SpO2 100%   BMI 29.43 kg/m  Pain Scale: 0-10   Pain Score: 0-No pain   SpO2: SpO2: 100 % O2 Device:SpO2: 100 % O2 Flow Rate: .    Palliative Assessment/Data:50%    Total time: I spent 90  minutes in the care of the patient today in the above activities and documenting the encounter.   Detailed review of medical records (labs, imaging, vital signs), medically appropriate exam, discussed with treatment team, counseling and education to patient, family, & staff, documenting clinical information, coordination of care.     Kathlyne JULIANNA Tracie Mickey, NP  Palliative Medicine Team Team phone # [phone]  Thank you for allowing the Palliative Medicine Team to assist in the care of this patient. Please utilize secure chat with additional questions, if there is no response within 30 minutes please call the above phone number.  Palliative Medicine Team providers are available by phone from 7am to 7pm daily and can be reached through the team cell phone.  Should this patient require assistance outside of these hours, please call the patient's attending physician.

## 2024-07-19 ENCOUNTER — Other Ambulatory Visit (HOSPITAL_COMMUNITY): Payer: Self-pay

## 2024-07-19 DIAGNOSIS — R9389 Abnormal findings on diagnostic imaging of other specified body structures: Secondary | ICD-10-CM | POA: Diagnosis not present

## 2024-07-19 DIAGNOSIS — R600 Localized edema: Secondary | ICD-10-CM | POA: Diagnosis not present

## 2024-07-19 DIAGNOSIS — I5020 Unspecified systolic (congestive) heart failure: Secondary | ICD-10-CM | POA: Diagnosis not present

## 2024-07-19 DIAGNOSIS — T733XXA Exhaustion due to excessive exertion, initial encounter: Secondary | ICD-10-CM | POA: Diagnosis not present

## 2024-07-19 DIAGNOSIS — Z515 Encounter for palliative care: Secondary | ICD-10-CM | POA: Diagnosis not present

## 2024-07-19 LAB — BASIC METABOLIC PANEL WITH GFR
Anion gap: 12 (ref 5–15)
BUN: 84 mg/dL — ABNORMAL HIGH (ref 8–23)
CO2: 32 mmol/L (ref 22–32)
Calcium: 9.8 mg/dL (ref 8.9–10.3)
Chloride: 85 mmol/L — ABNORMAL LOW (ref 98–111)
Creatinine, Ser: 2.48 mg/dL — ABNORMAL HIGH (ref 0.61–1.24)
GFR, Estimated: 27 mL/min — ABNORMAL LOW (ref >60)
Glucose, Bld: 99 mg/dL (ref 70–99)
Potassium: 3.7 mmol/L (ref 3.5–5.1)
Sodium: 129 mmol/L — ABNORMAL LOW (ref 135–145)

## 2024-07-19 LAB — MAGNESIUM: Magnesium: 2.2 mg/dL (ref 1.7–2.4)

## 2024-07-19 LAB — COOXEMETRY PANEL
Carboxyhemoglobin: 3.1 % — ABNORMAL HIGH (ref 0.5–1.5)
Methemoglobin: <0.7 % (ref 0.0–1.5)
O2 Saturation: 59.2 %
Total hemoglobin: 10.5 g/dL — ABNORMAL LOW (ref 12.0–16.0)

## 2024-07-19 MED ORDER — GUAIFENESIN ER 600 MG PO TB12
600.0000 mg | ORAL_TABLET | Freq: Two times a day (BID) | ORAL | 0 refills | Status: AC
  Filled 2024-07-19: qty 20, 10d supply, fill #0

## 2024-07-19 MED ORDER — TORSEMIDE 20 MG PO TABS
60.0000 mg | ORAL_TABLET | Freq: Every day | ORAL | 1 refills | Status: DC
  Filled 2024-07-19: qty 90, 30d supply, fill #0

## 2024-07-19 MED ORDER — HYDRALAZINE HCL 10 MG PO TABS
10.0000 mg | ORAL_TABLET | Freq: Three times a day (TID) | ORAL | 0 refills | Status: DC
  Filled 2024-07-19: qty 90, 30d supply, fill #0

## 2024-07-19 MED ORDER — ISOSORBIDE MONONITRATE ER 60 MG PO TB24: 30.0000 mg | ORAL_TABLET | Freq: Every day | ORAL | Status: DC

## 2024-07-19 NOTE — Progress Notes (Signed)
 CVAD removed per protocol per MD order. Manual pressure applied for 10 mins. Vaseline gauze, gauze, and Tegaderm applied over insertion site. No bleeding or swelling noted. Instructed patient to remain in bed for thirty mins. Educated patient about S/S of infection and when to call MD; no heavy lifting or pressure on R side for 24 hours; keep dressing dry and intact for 24 hours. Pt verbalized comprehension.

## 2024-07-19 NOTE — Discharge Summary (Signed)
 Physician Discharge Summary  Dean [name]. FMW:[id number] DOB: 04-09-1951 DOA: 07/10/2024  PCP: [name], MD  Admit date: 07/10/2024 Discharge date: 07/19/2024  Time spent: 45 minutes  Recommendations for Outpatient Follow-up:  CHF ToC clinic on 10/29   Discharge Diagnoses:  Active Problems: Acute on chronic systolic CHF   Acute kidney injury superimposed on chronic kidney disease   Essential hypertension   Paroxysmal atrial fibrillation (HCC)   ICD (implantable cardioverter-defibrillator) in place   CAD (coronary artery disease)   Type 2 diabetes mellitus in patient with obesity (HCC)   Anemia of chronic disease   Thrombocytopenia   Abnormal CT of the chest   Malnutrition of moderate degree   Discharge Condition: Improved  Diet recommendation: Diabetic, low-sodium, heart healthy  Filed Weights   07/17/24 0506 07/18/24 0500 07/19/24 0500  Weight: 83.9 kg 82.7 kg 84.1 kg    History of present illness:  73 y.o. male with medical history significant of HFrEF EF 40%, VT s/p AICD, PAF, hyperlipidemia, CKD IV and T2DM presents with weakness and fluid retention, found to have exacerbation of HFrEF.  CHF team is following for diuretics.  Patient has a PICC line in place and renal function is currently fluctuating due to aggressive diuretics.  CT chest without contrast did show concerns of liver lesion/cirrhosis and pulmonary nodules.  Acute hepatitis panel negative, AFP is negative.  There is concerns that this could be hemangioma as seen on ultrasound in July 2025 therefore will defer further workup as outpatient.   Hospital Course:   Acute exacerbation of HFrEF/moderate mitral regurgitation, EF 35% - Last noted EF 35-40% with grade 2 DD.  Mildly reduced RV, moderate to severe MR - Followed by heart failure team, had poor response to IV Lasix  initially, PICC line noted elevated CVP and low Co. ox, then started on milrinone  , diuresed with Lasix  and milrinone  - Now  switched to oral torsemide, continue hydralazine  and Imdur  - Patient is concerned about side effects and intolerant to higher doses of hydralazine  - No SGLT2i with recent UTI  - Volume status wise improved, also followed by palliative care, now DNR, recommend outpatient palliative care -Discharged home in a stable condition, follow-up with CHF TOC clinic on 10/29   Acute kidney injury on CKD 3B - Baseline creatinine 2.1,Currently fluctuating due to aggressive diuretics.    Paroxysmal atrial fibrillation - Continue amiodarone  and Eliquis .  Currently NSR.   HypoKalemia/hypophosphatemia - Repletion   History of VT s/p ICD - Amiodarone    Diabetes mellitus type II - Stable, diet controlled   Anemia of chronic disease - No active bleeding.  Hemoglobin stable   Possible cirrhosis with new liver lesions Pulmonary nodules -This is seen on the CT of the chest without contrast but no liver lesions were noted on the CT abdomen pelvis back in August.   Acute Hep panel is neg. AFP is normal.  Ultrasound in July noted hemangiomas.  Will defer outpatient follow-up regarding this.  Unable to do MRCP due to his renal issues at this time. -Dr.Amin d/w Daughter, son and patient is aware of this plan   Physical debilitation muscle weakness - PT/OT on board.  Recommending home health.  Face-to-face completed  Discharge Exam: Vitals:   07/19/24 0755 07/19/24 1058  BP: (!) 131/47 (!) 133/58  Pulse: 72 76  Resp: 18 19  Temp:  97.8 F (36.6 C)  SpO2: 99% 100%   Gen: Awake, Alert, Oriented X 3,  HEENT: no JVD Lungs: Good air  movement bilaterally, CTAB CVS: S1S2/RRR systolic murmur Abd: soft, Non tender, non distended, BS present Extremities: No edema Skin: no new rashes on exposed skin   Discharge Instructions   Discharge Instructions     Diet - low sodium heart healthy   Complete by: As directed    Increase activity slowly   Complete by: As directed       Allergies as of  07/19/2024       Reactions   Contrast Media [iodinated Contrast Media] Shortness Of Breath, Nausea And Vomiting   Lisinopril  Cough        Medication List     STOP taking these medications    furosemide  40 MG tablet Commonly known as: LASIX        TAKE these medications    allopurinol  300 MG tablet Commonly known as: ZYLOPRIM  Take 150 mg by mouth daily.   amiodarone  200 MG tablet Commonly known as: PACERONE  Take 1 tablet (200 mg total) by mouth daily.   atorvastatin  40 MG tablet Commonly known as: LIPITOR Take 40 mg by mouth daily.   cholecalciferol  25 MCG (1000 UNIT) tablet Commonly known as: VITAMIN D3 Take 1,000 Units by mouth daily.   Eliquis  5 MG Tabs tablet Generic drug: apixaban  TAKE 1 TABLET BY MOUTH TWICE A DAY   ferrous sulfate  325 (65 FE) MG tablet Take 325 mg by mouth daily.   hydrALAZINE  10 MG tablet Commonly known as: APRESOLINE  Take 1 tablet (10 mg total) by mouth 3 (three) times daily. What changed:  medication strength how much to take when to take this   isosorbide  mononitrate 60 MG 24 hr tablet Commonly known as: IMDUR  Take 0.5 tablets (30 mg total) by mouth daily. What changed: how much to take   polyethylene glycol powder 17 GM/SCOOP powder Commonly known as: GLYCOLAX /MIRALAX  Take 17 g by mouth daily. What changed:  when to take this reasons to take this   potassium chloride  SA 20 MEQ tablet Commonly known as: KLOR-CON  M Take 1 tablet (20 mEq total) by mouth daily.   Torsemide 60 MG Tabs Take 60 mg by mouth daily. Start taking on: July 20, 2024       Allergies  Allergen Reactions   Contrast Media [Iodinated Contrast Media] Shortness Of Breath and Nausea And Vomiting   Lisinopril  Cough    Follow-up Information     [facility] Follow up on 07/23/2024.   Specialty: Cardiology Why: Advanced Heart Failure Clinic 11:15 AM Contact information: [address] [place]  [phone]        [facility] Follow up.   Why: for home health services Contact information: [address] [place] [address] [phone]                  The results of significant diagnostics from this hospitalization (including imaging, microbiology, ancillary and laboratory) are listed below for reference.    Significant Diagnostic Studies: US  EKG SITE RITE Result Date: 07/11/2024 If Site Rite image not attached, placement could not be confirmed due to current cardiac rhythm.  CT Chest Wo Contrast Result Date: 07/10/2024 CLINICAL DATA:  Cough with respiratory illness. Right upper lobe opacity seen on chest x-ray earlier today. EXAM: CT CHEST WITHOUT CONTRAST TECHNIQUE: Multidetector CT imaging of the chest was performed following the standard protocol without IV contrast. RADIATION DOSE REDUCTION: This exam was performed according to the departmental dose-optimization program which includes  automated exposure control, adjustment of the mA and/or kV according to patient size and/or use of iterative reconstruction technique. COMPARISON:  Chest x-ray earlier today.  Chest CT 09/08/2019 FINDINGS: Cardiovascular: The heart is enlarged. Coronary artery calcification is evident. Moderate atherosclerotic calcification is noted in the wall of the thoracic aorta. Left-sided pacer/AICD evident. Mediastinum/Nodes: Mild pre-vascular lymphadenopathy evident with 11 mm short axis lymph node seen on 42/2. 12 mm short axis precarinal node on 52/2. No evidence for gross hilar lymphadenopathy although assessment is limited by the lack of intravenous contrast on the current study. The esophagus has normal imaging features. Borderline lymphadenopathy in the right axilla. Lungs/Pleura: Biapical pleuroparenchymal scarring evident. Mosaic attenuation in the lungs bilaterally is nonspecific but may reflect air trapping from small  airways disease. 7 mm posterior right pulmonary nodule on 59/4 is new since prior chest CT. 12 mm right lower lobe ground-glass opacity identified on 75/4. Subsegmental atelectasis noted in the left upper and lower lobes. Small bilateral pleural effusions. Upper Abdomen: Liver is heterogeneous with irregular contour suggesting cirrhosis. Comparing to abdomen CT of 05/04/2024, new subtle hypodense lesions are seen in the hepatic dome measuring 4.5 cm on image 116/2 and in the lateral segment left liver measuring 3.9 cm on image 119/2. There is probably a third 2.8 cm lesion in the caudate lobe on image 128/2. Calcified gallstones evident. New ascites is seen around the liver and the spleen. Musculoskeletal: No worrisome lytic or sclerotic osseous abnormality. IMPRESSION: 1. Heterogeneous liver with irregular contour suggesting cirrhosis. New subtle hypodense lesions in the hepatic dome and lateral segment left liver. There is probably a third lesion in the caudate lobe. Imaging features are concerning for metastatic disease. Dedicated CT abdomen and pelvis recommended with intravenous contrast material if renal function permits. 2. 7 mm posterior right pulmonary nodule is new since prior chest CT. 12 mm right lower lobe ground-glass opacity. Initial follow-up with CT at 6 months is recommended to confirm persistence. If persistent, repeat CT is recommended every 2 years until 5 years of stability has been established. This recommendation follows the consensus statement: Guidelines for Management of Incidental Pulmonary Nodules Detected on CT Images: From the Fleischner Society 2017; Radiology 2017; 284:228-243. 3. Mild mediastinal lymphadenopathy. 4. Mosaic attenuation in the lungs bilaterally is nonspecific but may reflect air trapping from small airways disease. 5. Small bilateral pleural effusions. 6. New ascites around the liver and spleen. 7. Cholelithiasis. 8.  Aortic Atherosclerosis (ICD10-I70.0).  Electronically Signed   By: Camellia Candle M.D.   On: 07/10/2024 07:12   DG Chest Port 1 View Result Date: 07/10/2024 EXAM: 1 VIEW(S) XRAY OF THE CHEST 07/10/2024 02:10:00 AM COMPARISON: 06/25/2024 CLINICAL HISTORY: DOE. Lethargic, leg swelling. FINDINGS: LINES, TUBES AND DEVICES: Left subclavian ICD. LUNGS AND PLEURA: Mild right basilar opacity, likely atelectasis. Faint patchy / nodular opacities in the right upper lobe, persistent, although possibly osseous. No pulmonary edema. No pleural effusion. No pneumothorax. HEART AND MEDIASTINUM: Cardiomegaly. Thoracic aortic atherosclerosis. BONES AND SOFT TISSUES: No acute osseous abnormality. IMPRESSION: 1. Cardiomegaly. No frank interstitial edema. 2. Faint patchy/nodular opacities in the right upper lobe, equivocal. Consider chest CT for further characterization if clinically warranted. 3. Mild right basilar opacity, likely atelectasis. Electronically signed by: Pinkie Pebbles MD 07/10/2024 02:18 AM EDT RP Workstation: [id number]   DG Chest Portable 1 View Result Date: 06/25/2024 CLINICAL DATA:  Increased shortness of breath and peripheral edema. EXAM: PORTABLE CHEST 1 VIEW COMPARISON:  05/07/2024 FINDINGS: Enlarged cardiac silhouette with an interval increase  in size. Stable left subclavian AICD lead. Mild diffuse peribronchial thickening without significant change. Interval minimal patchy density in the right mid to upper lung zone. No acute bony abnormality. IMPRESSION: 1. Interval minimal patchy density in the right mid to upper lung zone, suspicious for pneumonia. 2. Mildly progressive cardiomegaly. 3. Stable mild bronchitic changes. Electronically Signed   By: Elspeth Bathe M.D.   On: 06/25/2024 17:09    Microbiology: Recent Results (from the past 240 hours)  Resp panel by RT-PCR (RSV, Flu A&B, Covid) Anterior Nasal Swab     Status: None   Collection Time: 07/10/24  2:27 AM   Specimen: Anterior Nasal Swab  Result Value Ref Range Status   SARS  Coronavirus 2 by RT PCR NEGATIVE NEGATIVE Final   Influenza A by PCR NEGATIVE NEGATIVE Final   Influenza B by PCR NEGATIVE NEGATIVE Final    Comment: (NOTE) The Xpert Xpress SARS-CoV-2/FLU/RSV plus assay is intended as an aid in the diagnosis of influenza from Nasopharyngeal swab specimens and should not be used as a sole basis for treatment. Nasal washings and aspirates are unacceptable for Xpert Xpress SARS-CoV-2/FLU/RSV testing.  Fact Sheet for Patients: bloggercourse.com  Fact Sheet for Healthcare Providers: seriousbroker.it  This test is not yet approved or cleared by the [place]  FDA and has been authorized for detection and/or diagnosis of SARS-CoV-2 by FDA under an Emergency Use Authorization (EUA). This EUA will remain in effect (meaning this test can be used) for the duration of the COVID-19 declaration under Section 564(b)(1) of the Act, 21 U.S.C. section 360bbb-3(b)(1), unless the authorization is terminated or revoked.     Resp Syncytial Virus by PCR NEGATIVE NEGATIVE Final    Comment: (NOTE) Fact Sheet for Patients: bloggercourse.com  Fact Sheet for Healthcare Providers: seriousbroker.it  This test is not yet approved or cleared by the [place]  FDA and has been authorized for detection and/or diagnosis of SARS-CoV-2 by FDA under an Emergency Use Authorization (EUA). This EUA will remain in effect (meaning this test can be used) for the duration of the COVID-19 declaration under Section 564(b)(1) of the Act, 21 U.S.C. section 360bbb-3(b)(1), unless the authorization is terminated or revoked.  Performed at [facility], [address], [place], [place] [address]   Urine Culture     Status: Abnormal   Collection Time: 07/10/24  4:07 AM   Specimen: Urine, Random  Result Value Ref Range Status   Specimen Description URINE, RANDOM  Final   Special  Requests   Final    NONE Reflexed from [id number] Performed at [facility], [address], [place], [place] [address]    Culture MULTIPLE SPECIES PRESENT, SUGGEST RECOLLECTION (A)  Final   Report Status 07/11/2024 FINAL  Final     Labs: Basic Metabolic Panel: Recent Labs  Lab 07/15/24 0621 07/16/24 0439 07/17/24 0455 07/18/24 0500 07/19/24 0600  NA 129* 130* 130* 130* 129*  K 3.6 3.3* 3.6 3.8 3.7  CL 90* 89* 86* 85* 85*  CO2 30 31 33* 33* 32  GLUCOSE 96 109* 95 98 99  BUN 69* 76* 78* 82* 84*  CREATININE 2.50* 2.32* 2.29* 2.41* 2.48*  CALCIUM  10.2 10.3 9.9 10.0 9.8  MG 2.1 2.1 1.8 2.2 2.2  PHOS  --   --  1.8*  --   --    Liver Function Tests: No results for input(s): AST, ALT, ALKPHOS, BILITOT, PROT, ALBUMIN  in the last 168 hours. No results for input(s): LIPASE, AMYLASE in  the last 168 hours. No results for input(s): AMMONIA in the last 168 hours. CBC: No results for input(s): WBC, NEUTROABS, HGB, HCT, MCV, PLT in the last 168 hours. Cardiac Enzymes: No results for input(s): CKTOTAL, CKMB, CKMBINDEX, TROPONINI in the last 168 hours. BNP: BNP (last 3 results) Recent Labs    05/16/24 1308 06/25/24 1604 07/10/24 0227  BNP 618.2* 1,585.3* 1,952.1*    ProBNP (last 3 results) No results for input(s): PROBNP in the last 8760 hours.  CBG: No results for input(s): GLUCAP in the last 168 hours.     Signed:  Sigurd Pac MD.  [facility] 07/19/2024, 1:04 PM

## 2024-07-19 NOTE — TOC Progression Note (Signed)
 Transition of Care (TOC) - Progression Note    Patient Details  Name: Dean Rother Sr. MRN: [id number] Date of Birth: 12/02/1950  Transition of Care [facility]) CM/SW Contact  Marval Gell, RN Phone Number: 07/19/2024, 12:07 PM  Clinical Narrative:     [place] w patient who is agreeable to [facility] services. Insurance carrier works with [employer] and [facility] able to accept. Added to AVS.           HH Agency: [facility] Date [facility] Agency Contacted: 07/19/24 Time HH Agency Contacted: 1207 Representative spoke with at [facility] Agency: Amy   Social Drivers of Health (SDOH) Interventions SDOH Screenings   Food Insecurity: No Food Insecurity (07/10/2024)  Housing: Low Risk  (07/10/2024)  Transportation Needs: No Transportation Needs (07/10/2024)  Utilities: Not At Risk (07/10/2024)  Social Connections: Moderately Integrated (07/10/2024)  Recent Concern: Social Connections - Moderately Isolated (04/20/2024)  Tobacco Use: Low Risk  (07/10/2024)    Readmission Risk Interventions    05/09/2024    9:53 AM 04/22/2024   11:11 AM  Readmission Risk Prevention Plan  Transportation Screening Complete Complete  PCP or Specialist Appt within 5-7 Days  Complete  Home Care Screening  Complete  Medication Review (RN CM)  Complete  Medication Review (RN Care Manager) Complete   PCP or Specialist appointment within 3-5 days of discharge Complete   HRI or Home Care Consult Complete   SW Recovery Care/Counseling Consult Complete   Palliative Care Screening Not Applicable   Skilled Nursing Facility Not Applicable

## 2024-07-19 NOTE — Progress Notes (Signed)
 Palliative Medicine Inpatient Follow Up Note   HPI:  73 y.o. Dean [name]  with past medical history of  HFrEF, VT s/p AICD, PAF, hyperlipidemia, CKD IV and T2DM  admitted on 07/10/2024 with weakness and fluid retention after taking isosorbide  and hydralazine  . History of previous hospitalization requiring inotropic support for diuresis.    Note for 3 in-patient admissions, 4 ED visits in the last 6 months.      PMT has been consulted to assist with goals of care conversation. Patient/Family face treatment option decisions, advanced directive decisions and anticipatory care needs.     Today's Discussion 07/19/2024  *Please note that this is a verbal dictation therefore any spelling or grammatical errors are due to the Dragon Medical One system interpretation  Chart reviewed inclusive of vital signs, progress notes, laboratory results, and diagnostic images.  Visited the patient at bedside today. No family members were present. The patient was seated in a recliner, alert, oriented, and able to express his needs. He reported having a restful night and denied pain, discomfort, shortness of breath, or chest pain. He expressed excitement about being discharged, sharing his desire to return to the community and fulfill his obligations.  I reviewed with the patient the importance of medication adherence, avoiding overexertion, and recognizing early signs of fluid overload, especially in the context of his advanced heart failure. The plan of care remains unchanged. Continue to treat the treatable to an extent possible, hoping for clinical improvement. The patient has opted for DNR-Limited status, and I confirmed that this update has been communicated to his daughter, Dean [name]. The patient expressed appreciation for the discussion and support.  Created space and opportunity for patient to explore thoughts feelings and fears regarding current medical situation.  Patient and his family face treatment option  decisions, advanced directive decisions and anticipatory care needs.    Questions and concerns addressed   Palliative Support Provided.   Objective Assessment: Vital Signs Vitals:   07/19/24 0752 07/19/24 0755  BP: (!) 131/Dean (!) 131/Dean  Pulse: 74 72  Resp: 20 18  Temp: 97.9 F (36.6 C)   SpO2:  99%    Intake/Output Summary (Last 24 hours) at 07/19/2024 0849 Last data filed at 07/19/2024 0500 Gross per 24 hour  Intake 975.44 ml  Output 950 ml  Net 25.44 ml   Last Weight  Most recent update: 07/19/2024  6:06 AM    Weight  84.1 kg (185 lb 4.8 oz)             Physical Examination:    General:  Elderly Dean [name] sitting up in chair. Cardio: HR 69 BPM Lungs: clear Abdomen: soft, lax and nontender Extremities: UNNA boot in place. Neuro: alert & orientedx3. Affect depressed  SUMMARY OF RECOMMENDATIONS    Code Status: Maintain DNR-Limited Goal of care is medical stabilization and recovery to the extent this is possible Continue to provide psycho-social and emotional support to patient and family Palliative medicine team will continue to follow.    Symptom Management: Per Primary team Palliative medicine is available to assist as needed.      Time Spent: 35 minutes  Detailed review of medical records (labs, imaging, vital signs), medically appropriate exam, discussed with treatment team, counseling and education to patient, family, & staff, documenting clinical information, coordination of care.    ______________________________________________________________________________________ Kathlyne Bolder NP-C [place] Palliative Medicine Team Team Cell Phone: [phone] Please utilize secure chat with additional questions, if there is no response within 30 minutes please  call the above phone number  Palliative Medicine Team providers are available by phone from 7am to 7pm daily and can be reached through the team cell phone.  Should this patient require assistance  outside of these hours, please call the patient's attending physician.

## 2024-07-19 NOTE — Progress Notes (Signed)
 Patient ID: Dean Bukhari Sr., male   DOB: 1950/11/07, 73 y.o.   MRN: [id number]     Cardiologist: Vinie JAYSON Maxcy, MD  HF Cardiologist: Dr. [name]  Chief Complaint: CHF  Subjective:    CO-OX 59% off milrinone .  CVP recorded inaccurately. Weight back up? 95> 94>92>89>85>83>82>84 kg.  Palliative care seeing.   Pt wants to go home.   Objective:   Weight Range: 84.1 kg Body mass index is 29.91 kg/m.   Vital Signs:   Temp:  [97.7 F (36.5 C)-98.4 F (36.9 C)] 97.8 F (36.6 C) (10/25 1058) Pulse Rate:  [70-76] 76 (10/25 1058) Resp:  [18-24] 19 (10/25 1058) BP: (119-133)/(47-85) 133/58 (10/25 1058) SpO2:  [99 %-100 %] 100 % (10/25 1058) Weight:  [84.1 kg] 84.1 kg (10/25 0500) Last BM Date : 07/17/24  Weight change: Filed Weights   07/17/24 0506 07/18/24 0500 07/19/24 0500  Weight: 83.9 kg 82.7 kg 84.1 kg    Intake/Output:   Intake/Output Summary (Last 24 hours) at 07/19/2024 1206 Last data filed at 07/19/2024 0900 Gross per 24 hour  Intake 735.44 ml  Output 1450 ml  Net -714.56 ml    Physical Exam    General:  Elderly male sitting up in chair. Cor: JVP at clavicle sitting upright. Regular rate & rhythm. Soft systolic murmur Lungs: clear Abdomen: soft, nontender, nondistended.  Extremities: 1-2+ edema, unna boots off. Warm Neuro: alert & orientedx3. Affect depressed    Telemetry   SR 70s, wide qrs  Labs    CBC No results for input(s): WBC, NEUTROABS, HGB, HCT, MCV, PLT in the last 72 hours.  Basic Metabolic Panel Recent Labs    89/76/74 0455 07/18/24 0500 07/19/24 0600  NA 130* 130* 129*  K 3.6 3.8 3.7  CL 86* 85* 85*  CO2 33* 33* 32  GLUCOSE 95 98 99  BUN 78* 82* 84*  CREATININE 2.29* 2.41* 2.48*  CALCIUM  9.9 10.0 9.8  MG 1.8 2.2 2.2  PHOS 1.8*  --   --    Liver Function Tests No results for input(s): AST, ALT, ALKPHOS, BILITOT, PROT, ALBUMIN  in the last 72 hours.  No results for input(s): LIPASE,  AMYLASE in the last 72 hours. Cardiac Enzymes No results for input(s): CKTOTAL, CKMB, CKMBINDEX, TROPONINI in the last 72 hours.  BNP: BNP (last 3 results) Recent Labs    05/16/24 1308 06/25/24 1604 07/10/24 0227  BNP 618.2* 1,585.3* 1,952.1*    ProBNP (last 3 results) No results for input(s): PROBNP in the last 8760 hours.   D-Dimer No results for input(s): DDIMER in the last 72 hours. Hemoglobin A1C No results for input(s): HGBA1C in the last 72 hours. Fasting Lipid Panel No results for input(s): CHOL, HDL, LDLCALC, TRIG, CHOLHDL, LDLDIRECT in the last 72 hours. Thyroid Function Tests No results for input(s): TSH, T4TOTAL, T3FREE, THYROIDAB in the last 72 hours.  Invalid input(s): FREET3  Other results:   Imaging    No results found.    Medications:     Scheduled Medications:  amiodarone   200 mg Oral Daily   apixaban   5 mg Oral BID   atorvastatin   40 mg Oral Daily   Chlorhexidine  Gluconate Cloth  6 each Topical Daily   feeding supplement  237 mL Oral BID BM   ferrous sulfate   325 mg Oral Daily   hydrALAZINE   10 mg Oral Q8H   isosorbide  mononitrate  30 mg Oral Daily   multivitamin with minerals  1 tablet Oral Daily   sodium  chloride flush  10-40 mL Intracatheter Q12H   sodium chloride  flush  3 mL Intravenous Q12H   torsemide  60 mg Oral Daily    Infusions:    PRN Medications: acetaminophen , melatonin, ondansetron  (ZOFRAN ) IV, mouth rinse, oxyCODONE , polyethylene glycol, sodium chloride  flush, sodium chloride  flush    Patient Profile   73 y/o male w/ chronic systolic heart failure d/t ICM, CAD, MR, PAF, H/o VT/VF s/p ICD and CKD IIIb admitted w/ a/c CHF and AKI.   Assessment/Plan   Chronic systolic heart failure: Primarily ischemic, prior history of infarcted RCA.   - Most recent 2D echo 8/25 EF 35-40%, RV mildly reduced, moderate-severe MR.  - NYHA IIIb w/ volume overload - Sluggish response to IV lasix ,  PICC placed with elevated CVP and low co-ox.  He was started on milrinone , now on 0.25.   - CO-OX 59% after stopping milrinone . - CVP inaccurate. Increase Torsemide to 60 q am - CKD limits MRA and RAASi - Continue hydralazine  10 TID + Imdur  15 mg daily. Would like to titrate further but he is very concerned about side effects. He was intolerant of hydralazine  in the past but at a much higher dose. We are very limited in what we can use for GDMT.  - No SGLT-2 with recent UTI - no ? blocker w/ concern for low output  - UNNA boots - ICD in place. -  palliative care has seen.   Mitral regurgitation: 2/2 ischemic disease.  TEE 9/25 with more moderate MR by PISA, valve area, systolic blunting of pulmonary veins. 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 Scr trended up to 2.7, now improving with inotrope support and diuresis. 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 Continue statin   Abnormal Chest CT  - posterior right pulmonary nodule is new since prior study + possible cirrhosis + hepatic lesions concerning for ? 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  He has follow up on 10/29 HF TOC and very much wants to go home. Reasonable. Torsemide 60 mg daily.   Length of Stay: 9  Soyla DELENA Merck, MD  07/19/2024, 12:06 PM

## 2024-07-19 NOTE — Plan of Care (Signed)
  Problem: Education: Goal: Knowledge of General Education information will improve Description: Including pain rating scale, medication(s)/side effects and non-pharmacologic comfort measures Outcome: Progressing   Problem: Clinical Measurements: Goal: Ability to maintain clinical measurements within normal limits will improve Outcome: Progressing   Problem: Clinical Measurements: Goal: Will remain free from infection Outcome: Progressing   Problem: Clinical Measurements: Goal: Will remain free from infection Outcome: Progressing   Problem: Clinical Measurements: Goal: Diagnostic test results will improve Outcome: Progressing   Problem: Clinical Measurements: Goal: Respiratory complications will improve Outcome: Progressing   Problem: Clinical Measurements: Goal: Cardiovascular complication will be avoided Outcome: Progressing   Problem: Elimination: Goal: Will not experience complications related to bowel motility Outcome: Progressing   Problem: Elimination: Goal: Will not experience complications related to urinary retention Outcome: Progressing

## 2024-07-20 ENCOUNTER — Other Ambulatory Visit (HOSPITAL_COMMUNITY): Payer: Self-pay

## 2024-07-22 ENCOUNTER — Telehealth (HOSPITAL_COMMUNITY): Payer: Self-pay

## 2024-07-22 NOTE — Telephone Encounter (Signed)
 Called to confirm/remind patient of their appointment at the [facility] on 07/23/24 11:15.   Appointment:   [] Confirmed  [] Left mess   [] No answer/No voice mail  [x] VM Full/unable to leave message  [] Phone not in service  Patient reminded to bring all medications and/or complete list.  Confirmed patient has transportation. Gave directions, instructed to utilize valet parking.

## 2024-07-23 ENCOUNTER — Inpatient Hospital Stay (HOSPITAL_COMMUNITY)

## 2024-07-23 NOTE — Progress Notes (Incomplete)
 ADVANCED HEART FAILURE FOLLOW UP CLINIC NOTE  Referring Physician: [name], MD  Primary Care: [name], MD Primary Cardiologist:  HPI: Dean Gaus Sr. is a 73 y.o. male who presents for follow up of chronic systolic heart failure.      Cardiac history dates back to 2017 when he suffered VT/VF arrest requiring CPR/defibrillation. Cardiac cath demonstrated 90% ostial RCA and 100% m to d RCA with collaterals. CAD treated medically. Echo with LVEF 35-40% and AK of basal inferior and apical myocardium. Later underwent single chamber medtronic ICD in 09/2016. Since then EF has been in range of 30-35%.    Admitted in 12/23 d/t ICD shock for VT/VF in setting of amiodarone  noncompliance.   He was admitted in 2/25 with GI bleed requiring transfusions after polypectomy by hot snare with clips.   He was admitted 07/27-07/29/25 with acute on chronic CHF. He was diuresed with IV lasix .   Presented 04/2024 with shortness of breath and dysuria (prior UTI). Became cold on exam with uptrending lactic acid and underwent RHC showing severely elevated filling pressures and reduced cardiac index. Required IV milrinone  with improvement, weaned off with aggressive diuresis prior to discharge.   He was readmitted in 10/25 with acute on chronic CHF c/b AKI. Required inotrope support with milrinone  to facilitate diuresis. Able to wean off inotropic support. GDMT limited by CKD and intolerance to higher doses of hydralazine .      SUBJECTIVE:  He is here today for post hospital CHF follow-up.   PMH, current medications, allergies, social history, and family history reviewed in epic.  PHYSICAL EXAM: There were no vitals filed for this visit.  GENERAL: NAD, well appearing PULM:  Normal work of breathing, CTAB CARDIAC:  JVP: mildly elevated         Normal rate with regular rhythm. Systolic murmur.  Trace edema. Warm and well perfused extremities. ABDOMEN: Soft, non-tender,  non-distended. NEUROLOGIC: Patient is oriented x3 with no focal or lateralizing neurologic deficits.     DATA REVIEW  ECG 05/05/2024: NSIVCD, LBBB apperance, sinus  ECHO: TTE 05/08/2024: LVEF 35-40%, mild grade II DD, mildly reduced RV function, posterior leaflet restricted with moderate to severe MR  TEE 06/09/24: moderately reduced EF, moderate MR by PISA with systolic blunting of pulmonary veins, no reversal  CATH: 05/06/2024: RA 15, PA 64/27 (39), PCWP 25 with v waves to 40, TD CO/CI 3.6/1.8, PAPi 2.5  08/2016: RCA occluded with left to right collaterals  ASSESSMENT & PLAN:   Chronic systolic heart failure: Primarily ischemic, prior history of infarcted RCA.   - 2D echo 8/25 EF 35-40%, RV mildly reduced, moderate-severe MR.  - NYHA IIIb w/ volume overload - Sluggish response to IV lasix , PICC placed with elevated CVP and low co-ox.  He was started on milrinone , now on 0.25.   - CO-OX 68% on 0.125 milrinone . Will stop today.  - CVP 10. Increase Torsemide to 60 q am, 40 q pm. - CKD limits MRA and RAASi - Continue imdur  10 TID + Imdur  15 mg daily. Would like to titrate further but he is very concerned about side effects. He was intolerant of hydralazine  in the past but at a much higher dose. We are very limited in what we can use for GDMT.  - No SGLT-2 with recent UTI - no ? blocker w/ concern for low output  - UNNA boots - ICD in place. - We had a long discussion about his trajectory and expected prognosis. He's had 2 admissions  requiring inotrope support. 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 He is interested in meeting with Palliative Care, consult placed.   Mitral regurgitation: 2/2 ischemic disease.  TEE 9/25 with more moderate MR by PISA, valve area, systolic blunting of pulmonary veins. 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 Scr trended up to 2.7, now improving with inotrope support and diuresis. 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 Continue statin   Abnormal Chest CT  - posterior right pulmonary nodule is new since prior study + possible cirrhosis + hepatic lesions concerning for ? 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   Follow-up: ***  Manuelita Dutch, PA-C 07/23/24

## 2024-07-25 NOTE — Telephone Encounter (Signed)
 3 attempts to contact patient  All attempts have been unsuccessful  Will close encounter

## 2024-07-28 ENCOUNTER — Telehealth: Payer: Self-pay | Admitting: Internal Medicine

## 2024-07-28 ENCOUNTER — Ambulatory Visit: Attending: Cardiology

## 2024-07-28 DIAGNOSIS — Z95810 Presence of automatic (implantable) cardiac defibrillator: Secondary | ICD-10-CM

## 2024-07-28 DIAGNOSIS — I5022 Chronic systolic (congestive) heart failure: Secondary | ICD-10-CM

## 2024-07-28 NOTE — Telephone Encounter (Signed)
 Daughter states that she was returning a call. Please advise

## 2024-07-28 NOTE — Telephone Encounter (Signed)
 Patient following up. He says he is returning a call as well. He requests a call back before 2:30 PM if at all possible. He says he has an appointment at that time.

## 2024-07-30 NOTE — Progress Notes (Signed)
 EPIC Encounter for ICM Monitoring  Patient Name: Dean Stief Sr. is a 73 y.o. male Date: 07/30/2024 Primary Care Physican: [name], MD Primary Cardiologist: [facility] Electrophysiologist: [facility] Nephrologist:  [facility] 01/31/2023 Weight:   183 lbs 06/21/2023 Weight: 183 lbs 07/24/2023 Weight: 183-184 lbs 11/20/2023 Hospital Discharge Weight: 190 lbs 12/25/2023 Weight: 182 lbs 02/06/2024 Office Weight: 191 lbs 03/04/2024 Weight: 188 lbs  04/20/2024 Hospital Weight: 165 lbs 05/23/2024 Office Weigh: 194 lbs 07/19/2024 Hospital Discharge Weight: 185.4 lbs 07/30/2024 Weight: 180 lbs   Clinical Status Since 18-Jun-2024 Time in AF   0.0 hr/day (0.0%)(taking Eliquis )   Spoke with patient and heart failure questions reviewed.  Transmission results reviewed.  Pt reports he does not have any energy since being hospital, slight swelling in feet (but resolves overnight) and has leg weakness.   Hospitalization 07/10/2024-07/19/2024.   Diet: He is not eating much restaurant foods.     Optivol Thoracic impedance suggesting possible fluid accumulation from 06/19/2024-07/12/2024 which correlates with hospitalization.  Normal fluid levels since 07/19/2024 discharge.   Prescribed:  Torsemide 20 mg mg Take 3 tablet(s) (60 mg total) by mouth daily.    Potassium 20 mEq take 1 tablet by mouth daily   Labs: 07/19/2024 Creatinine 2.48, BUN 84, Potassium 3.7, Sodium 129, GFR 27  07/18/2024 Creatinine 2.41, BUN 82, Potassium 3.8, Sodium 130, GFR 27  07/17/2024 Creatinine 2.29, BUN 78, Potassium 3.6, Sodium 130, GFR 29  07/16/2024 Creatinine 2.32, BUN 76, Potassium 3.3, Sodium 130, GFR 29 07/15/2024 Creatinine 2.50, BUN 69, Potassium 3.6, Sodium 129, GFR 26  07/14/2024 Creatinine 2.03, BUN 64, Potassium 4.0, Sodium 129, GFR 34  A complete set of results can be found in Results Review.   Recommendations:  Recommendation to limit salt intake to 2000 mg daily and fluid intake to 64 oz  daily.  Encouraged to call if experiencing any fluid symptoms.      Follow-up plan: ICM clinic phone appointment on 08/18/2024 to check stability of fluid levels post hospitalization.   91 day device clinic remote transmission 08/14/2024.     EP/Cardiology Office Visits:  08/07/2024 with Dr [name] (hosp f/u).  08/08/2024 with Lamarr Mose, NP.   Recall 07/01/2024 with Dr Inocencio.   Copy of ICM check sent to Dr. Inocencio.   Remote monitoring is medically necessary for Heart Failure Management.    Daily Thoracic Impedance ICM trend: 04/28/2024 through 07/28/2024.    12-14 Month Thoracic Impedance ICM trend:     Mitzie GORMAN Garner, RN 07/30/2024 8:12 AM

## 2024-08-04 ENCOUNTER — Encounter: Payer: Self-pay | Admitting: Cardiology

## 2024-08-07 ENCOUNTER — Ambulatory Visit (HOSPITAL_COMMUNITY): Attending: Cardiology | Admitting: Cardiology

## 2024-08-07 NOTE — Progress Notes (Deleted)
°  Cardiology Office Note   Date:  08/07/2024  ID:  Dean Croft Sr., DOB [birth year], MRN [id number] PCP: [name], MD  [facility] Providers Cardiologist:  Vinie JAYSON Maxcy, MD Cardiology APP:  Jerilynn Lamarr HERO, NP  Electrophysiologist:  [name], MD { Click to update primary MD,subspecialty MD or APP then REFRESH:1}    History of Present Illness Dean Sargeant Sr. is a 73 y.o. male with a past medical history of V-fib/V. tach arrest in 2017, HFrEF with presence of ICD, atrial fibrillation, CAD, hypertension, NSVT, OSA, DM2, history of anoxic brain injury, CKD stage III, dyslipidemia  06/09/2024 echo moderate MR, severe lead related TR 05/15/2024 device check 05/08/2024 echo EF 35 to 40%, global hypokinesis, grade 2 DD, biatrial enlargement, moderate to severe MR, mild aortic valve regurgitation 05/06/2024 right heart cath mixed moderate pulmonary hypertension 01/01/2024 echo EF 30 to 35%, severely elevated PASP, severe MR, moderate to severe TR 10/10/2016 ICD implantation 09/07/2016 cardiac cath distal RCA occlusion with left-to-right collaterals with plans for medical therapy  He is a longstanding patient of Dr. Norton, initially establishing with him in 2018 following a hospital admission for V-fib V. tach arrest >> upon arrival to the hospital he had an episode of NSVT was treated with amiodarone  >> cardiac cath revealed 90% ostial stenosis of distal RCA >> ultimately discharged with LifeVest secondary to bacteremia delaying ICD implantation.  He ultimately underwent ICD implantation on 10/10/2016.  2023 he was admitted to the hospital after an ICD shock for VT/VF in the setting of noncompliance with his amiodarone .  In 2025 he was admitted to the hospital with a GI bleed requiring transfusions.  In July 2025 he was admitted to the hospital with heart failure exacerbation.  In August 2025 he presented to the hospital  Evaluated by Dr. [name] on 06/18/2024, this was  following his most recent hospitalization, a TEE was arranged for evaluation of his mitral regurgitation, his hydralazine  was increased to 75 mg 3 times daily with plans for up titration of his medical therapy and if echo remains low consider CRT upgrade.  ROS: ***  Studies Reviewed      *** Risk Assessment/Calculations {Does this patient have ATRIAL FIBRILLATION?:[phone]} No BP recorded.  {Refresh Note OR Click here to enter BP  :1}***       Physical Exam VS:  There were no vitals taken for this visit.       Wt Readings from Last 3 Encounters:  07/19/24 185 lb 4.8 oz (84.1 kg)  06/25/24 189 lb 9.5 oz (86 kg)  06/18/24 189 lb 9.6 oz (86 kg)    GEN: Well nourished, well developed in no acute distress NECK: No JVD; No carotid bruits CARDIAC: ***RRR, no murmurs, rubs, gallops RESPIRATORY:  Clear to auscultation without rales, wheezing or rhonchi  ABDOMEN: Soft, non-tender, non-distended EXTREMITIES:  No edema; No deformity   ASSESSMENT AND PLAN ***    {Are you ordering a CV Procedure (e.g. stress test, cath, DCCV, TEE, etc)?   Press F2        :[id number]}  Dispo: ***  Signed, Delon JAYSON Hoover, NP

## 2024-08-08 ENCOUNTER — Ambulatory Visit: Admitting: Cardiology

## 2024-08-11 ENCOUNTER — Other Ambulatory Visit: Payer: Self-pay | Admitting: Nurse Practitioner

## 2024-08-11 NOTE — Telephone Encounter (Signed)
 Late entry we were calling patient to confirm his appointment but patient did not show

## 2024-08-11 NOTE — Progress Notes (Deleted)
" °  Cardiology Office Note   Date:  08/11/2024  ID:  Dean Croft Sr., DOB 09/09/1951, MRN [id number] PCP: [name], MD   [facility] Providers Cardiologist:  Vinie JAYSON Maxcy, MD Cardiology APP:  Jerilynn Lamarr HERO, NP  Electrophysiologist:  [name], MD { Click to update primary MD,subspecialty MD or APP then REFRESH:1}    History of Present Illness Dean Slinger Sr. is a 73 y.o. male with a past medical history of V-fib/V. tach arrest in 2017, HFrEF with presence of ICD, atrial fibrillation, CAD, hypertension, NSVT, OSA, DM2, history of anoxic brain injury, CKD stage III, dyslipidemia  06/09/2024 TEE  moderate MR, severe lead related TR 05/15/2024 device check 05/08/2024 echo EF 35 to 40%, global hypokinesis, grade 2 DD, biatrial enlargement, moderate to severe MR, mild aortic valve regurgitation 05/06/2024 right heart cath mixed moderate pulmonary hypertension 01/01/2024 echo EF 30 to 35%, severely elevated PASP, severe MR, moderate to severe TR 10/10/2016 ICD implantation 09/07/2016 cardiac cath distal RCA occlusion with left-to-right collaterals with plans for medical therapy  He is a longstanding patient of Dr. Norton, initially establishing with him in 2018 following a hospital admission for V-fib V. tach arrest >> upon arrival to the hospital he had an episode of NSVT was treated with amiodarone  >> cardiac cath revealed 90% ostial stenosis of distal RCA >> ultimately discharged with LifeVest secondary to bacteremia delaying ICD implantation.  He ultimately underwent ICD implantation on 10/10/2016.  2023 he was admitted to the hospital after an ICD shock for VT/VF in the setting of noncompliance with his amiodarone .  In 2025 he was admitted to the hospital with a GI bleed requiring transfusions.  In July 2025 he was admitted to the hospital with heart failure exacerbation.  In August 2025 he presented to the hospital  Evaluated by Dr. [name] on 06/18/2024, this was  following his most recent hospitalization, a TEE was arranged for evaluation of his mitral regurgitation, his hydralazine  was increased to 75 mg 3 times daily with plans for up titration of his medical therapy and if echo remains low consider CRT upgrade.  Dean [name]?? Made him feel funny Recent UTI  Primarily ischemic, prior history of infarcted RCA. TEE with more moderate MR by PISA, valve area, systolic blunting of pulmonary veins. Will continue with medical therapy for now.   ROS: ***  Studies Reviewed      *** Risk Assessment/Calculations {Does this patient have ATRIAL FIBRILLATION?[phone]} No BP recorded.  {Refresh Note OR Click here to enter BP  :1}***       Physical Exam VS:  There were no vitals taken for this visit.       Wt Readings from Last 3 Encounters:  07/19/24 185 lb 4.8 oz (84.1 kg)  06/25/24 189 lb 9.5 oz (86 kg)  06/18/24 189 lb 9.6 oz (86 kg)    GEN: Well nourished, well developed in no acute distress NECK: No JVD; No carotid bruits CARDIAC: ***RRR, no murmurs, rubs, gallops RESPIRATORY:  Clear to auscultation without rales, wheezing or rhonchi  ABDOMEN: Soft, non-tender, non-distended EXTREMITIES:  No edema; No deformity   ASSESSMENT AND PLAN ***    {Are you ordering a CV Procedure (e.g. stress test, cath, DCCV, TEE, etc)?   Press F2        :[id number]}  Dispo: ***  Signed, Delon JAYSON Hoover, NP  "

## 2024-08-12 ENCOUNTER — Telehealth (HOSPITAL_COMMUNITY): Payer: Self-pay | Admitting: Cardiology

## 2024-08-12 NOTE — Telephone Encounter (Signed)
 Called to confirm/remind patient of their appointment at the [facility] on 08/12/24.   Appointment:   [] Confirmed  [] Left mess   [] No answer/No voice mail  [x] VM Full/unable to leave message  [] Phone not in service  Patient reminded to bring all medications and/or complete list.  Confirmed patient has transportation. Gave directions, instructed to utilize valet parking.

## 2024-08-13 ENCOUNTER — Encounter (HOSPITAL_COMMUNITY): Admitting: Cardiology

## 2024-08-14 ENCOUNTER — Ambulatory Visit (INDEPENDENT_AMBULATORY_CARE_PROVIDER_SITE_OTHER)

## 2024-08-14 ENCOUNTER — Ambulatory Visit: Attending: Cardiology | Admitting: Cardiology

## 2024-08-14 ENCOUNTER — Ambulatory Visit: Payer: Self-pay

## 2024-08-14 DIAGNOSIS — I255 Ischemic cardiomyopathy: Secondary | ICD-10-CM | POA: Diagnosis not present

## 2024-08-14 DIAGNOSIS — Z95810 Presence of automatic (implantable) cardiac defibrillator: Secondary | ICD-10-CM

## 2024-08-14 DIAGNOSIS — I5022 Chronic systolic (congestive) heart failure: Secondary | ICD-10-CM

## 2024-08-14 NOTE — Progress Notes (Signed)
 EPIC Encounter for ICM Monitoring  Patient Name: Dean Harkless Sr. is a 73 y.o. male Date: 08/14/2024 Primary Care Physican: [name], MD Primary Cardiologist: [facility] Electrophysiologist: [facility] Nephrologist:  [facility] 01/31/2023 Weight:   183 lbs 06/21/2023 Weight: 183 lbs 07/24/2023 Weight: 183-184 lbs 11/20/2023 Hospital Discharge Weight: 190 lbs 12/25/2023 Weight: 182 lbs 02/06/2024 Office Weight: 191 lbs 03/04/2024 Weight: 188 lbs  04/20/2024 Hospital Weight: 165 lbs 05/23/2024 Office Weigh: 194 lbs 07/19/2024 Hospital Discharge Weight: 185.4 lbs 07/30/2024 Weight: 180 lbs 08/14/2024 Weight: 181 lbs (he reports he last discharge weight was 173 lbs)   Since 28-Jul-2024 Time in AF   0.0 hr/day (0.0%)(taking Eliquis )   Spoke with patient and heart failure questions reviewed.  Transmission results reviewed.  Pt reports slight swelling in legs since last hospitalization.  He reports last discharge weight as 173 lbs.  Pt has missed HF clinic appointments as well as he general cardiology appointments in the last 2 weeks.  He is cancelling today's (08/14/2024) appointment as well.  He reports his PCP has been helping him with is medications.   The home nurse told him on 08/13/2024 that looks like he has some fluid.    Diet: He is not eating much restaurant foods.     Optivol Thoracic impedance suggesting possible fluid accumulation starting 07/31/2024.   Prescribed:  Torsemide  20 mg mg Take 3 tablet(s) (60 mg total) by mouth daily.   08/14/2024 Pt reports confusion regarding dosage and taking 2 tablets instead of 3 tablets.  Potassium 20 mEq take 1 tablet by mouth daily   Labs: 07/19/2024 Creatinine 2.48, BUN 84, Potassium 3.7, Sodium 129, GFR 27  07/18/2024 Creatinine 2.41, BUN 82, Potassium 3.8, Sodium 130, GFR 27  07/17/2024 Creatinine 2.29, BUN 78, Potassium 3.6, Sodium 130, GFR 29  07/16/2024 Creatinine 2.32, BUN 76, Potassium 3.3, Sodium 130, GFR  29 07/15/2024 Creatinine 2.50, BUN 69, Potassium 3.6, Sodium 129, GFR 26  07/14/2024 Creatinine 2.03, BUN 64, Potassium 4.0, Sodium 129, GFR 34  A complete set of results can be found in Results Review.   Recommendations:  Advised to take Torsemide  as prescribed 3 tablets daily.  He states PCP is managing his meds and will call him today regarding the fluid and get recommendations.    Follow-up plan: ICM clinic phone appointment on 09/08/2024.   91 day device clinic remote transmission 11/13/2024.     EP/Cardiology Office Visits:  Missed 08/07/2024 with Dr [name] (hosp f/u).  Missed 08/08/2024 with Lamarr Mose, NP.   Missed 08/14/2024 with Delon Hoover, NP.  Recall 07/01/2024 with Dr Inocencio.   Copy of ICM check sent to Dr. Inocencio.   Remote monitoring is medically necessary for Heart Failure Management.    Daily Thoracic Impedance ICM trend: 05/15/2024 through 08/14/2024.    12-14 Month Thoracic Impedance ICM trend:     Mitzie GORMAN Garner, RN 08/14/2024 10:39 AM

## 2024-08-15 LAB — CUP PACEART REMOTE DEVICE CHECK
Battery Remaining Longevity: 35 mo
Battery Voltage: 2.96 V
Brady Statistic RV Percent Paced: 0.01 %
Date Time Interrogation Session: 20251120043723
HighPow Impedance: 31 Ohm
Implantable Lead Connection Status: 753985
Implantable Lead Implant Date: 20180116
Implantable Lead Location: 753860
Implantable Lead Model: 6935
Implantable Pulse Generator Implant Date: 20180116
Lead Channel Impedance Value: 247 Ohm
Lead Channel Impedance Value: 304 Ohm
Lead Channel Pacing Threshold Amplitude: 1 V
Lead Channel Pacing Threshold Pulse Width: 0.4 ms
Lead Channel Sensing Intrinsic Amplitude: 8.125 mV
Lead Channel Sensing Intrinsic Amplitude: 8.125 mV
Lead Channel Setting Pacing Amplitude: 2.5 V
Lead Channel Setting Pacing Pulse Width: 0.4 ms
Lead Channel Setting Sensing Sensitivity: 0.3 mV
Zone Setting Status: 755011
Zone Setting Status: 755011

## 2024-08-15 NOTE — Progress Notes (Signed)
 Remote ICD Transmission

## 2024-08-17 ENCOUNTER — Ambulatory Visit: Payer: Self-pay | Admitting: Cardiology

## 2024-08-18 ENCOUNTER — Ambulatory Visit

## 2024-08-22 ENCOUNTER — Encounter (HOSPITAL_COMMUNITY): Payer: Self-pay

## 2024-08-22 ENCOUNTER — Inpatient Hospital Stay (HOSPITAL_COMMUNITY): Admission: EM | Admit: 2024-08-22 | Discharge: 2024-09-15 | DRG: 551 | Disposition: A | Discharge status: Home Health

## 2024-08-22 ENCOUNTER — Inpatient Hospital Stay (HOSPITAL_COMMUNITY)

## 2024-08-22 ENCOUNTER — Other Ambulatory Visit: Payer: Self-pay

## 2024-08-22 ENCOUNTER — Emergency Department (HOSPITAL_COMMUNITY)

## 2024-08-22 DIAGNOSIS — I5082 Biventricular heart failure: Secondary | ICD-10-CM | POA: Diagnosis present

## 2024-08-22 DIAGNOSIS — E1122 Type 2 diabetes mellitus with diabetic chronic kidney disease: Secondary | ICD-10-CM | POA: Diagnosis present

## 2024-08-22 DIAGNOSIS — Z6834 Body mass index (BMI) 34.0-34.9, adult: Secondary | ICD-10-CM

## 2024-08-22 DIAGNOSIS — R911 Solitary pulmonary nodule: Secondary | ICD-10-CM | POA: Diagnosis present

## 2024-08-22 DIAGNOSIS — E8809 Other disorders of plasma-protein metabolism, not elsewhere classified: Secondary | ICD-10-CM | POA: Diagnosis present

## 2024-08-22 DIAGNOSIS — N189 Chronic kidney disease, unspecified: Secondary | ICD-10-CM | POA: Diagnosis present

## 2024-08-22 DIAGNOSIS — I130 Hypertensive heart and chronic kidney disease with heart failure and stage 1 through stage 4 chronic kidney disease, or unspecified chronic kidney disease: Secondary | ICD-10-CM | POA: Diagnosis not present

## 2024-08-22 DIAGNOSIS — E875 Hyperkalemia: Secondary | ICD-10-CM | POA: Diagnosis present

## 2024-08-22 DIAGNOSIS — T07XXXA Unspecified multiple injuries, initial encounter: Secondary | ICD-10-CM | POA: Diagnosis not present

## 2024-08-22 DIAGNOSIS — N184 Chronic kidney disease, stage 4 (severe): Secondary | ICD-10-CM | POA: Diagnosis present

## 2024-08-22 DIAGNOSIS — D509 Iron deficiency anemia, unspecified: Secondary | ICD-10-CM | POA: Diagnosis present

## 2024-08-22 DIAGNOSIS — Z7901 Long term (current) use of anticoagulants: Secondary | ICD-10-CM

## 2024-08-22 DIAGNOSIS — W050XXA Fall from non-moving wheelchair, initial encounter: Secondary | ICD-10-CM | POA: Diagnosis present

## 2024-08-22 DIAGNOSIS — I480 Paroxysmal atrial fibrillation: Secondary | ICD-10-CM | POA: Diagnosis present

## 2024-08-22 DIAGNOSIS — L97919 Non-pressure chronic ulcer of unspecified part of right lower leg with unspecified severity: Secondary | ICD-10-CM | POA: Diagnosis present

## 2024-08-22 DIAGNOSIS — R34 Anuria and oliguria: Secondary | ICD-10-CM | POA: Diagnosis not present

## 2024-08-22 DIAGNOSIS — I1311 Hypertensive heart and chronic kidney disease without heart failure, with stage 5 chronic kidney disease, or end stage renal disease: Secondary | ICD-10-CM | POA: Diagnosis not present

## 2024-08-22 DIAGNOSIS — Z23 Encounter for immunization: Secondary | ICD-10-CM

## 2024-08-22 DIAGNOSIS — N179 Acute kidney failure, unspecified: Secondary | ICD-10-CM | POA: Diagnosis not present

## 2024-08-22 DIAGNOSIS — I2510 Atherosclerotic heart disease of native coronary artery without angina pectoris: Secondary | ICD-10-CM | POA: Diagnosis present

## 2024-08-22 DIAGNOSIS — I493 Ventricular premature depolarization: Secondary | ICD-10-CM | POA: Diagnosis not present

## 2024-08-22 DIAGNOSIS — E876 Hypokalemia: Secondary | ICD-10-CM | POA: Diagnosis not present

## 2024-08-22 DIAGNOSIS — S22059A Unspecified fracture of T5-T6 vertebra, initial encounter for closed fracture: Secondary | ICD-10-CM | POA: Diagnosis present

## 2024-08-22 DIAGNOSIS — W109XXA Fall (on) (from) unspecified stairs and steps, initial encounter: Secondary | ICD-10-CM | POA: Diagnosis present

## 2024-08-22 DIAGNOSIS — Z992 Dependence on renal dialysis: Secondary | ICD-10-CM | POA: Diagnosis not present

## 2024-08-22 DIAGNOSIS — S22009A Unspecified fracture of unspecified thoracic vertebra, initial encounter for closed fracture: Secondary | ICD-10-CM

## 2024-08-22 DIAGNOSIS — R932 Abnormal findings on diagnostic imaging of liver and biliary tract: Secondary | ICD-10-CM | POA: Insufficient documentation

## 2024-08-22 DIAGNOSIS — J90 Pleural effusion, not elsewhere classified: Secondary | ICD-10-CM

## 2024-08-22 DIAGNOSIS — Z8249 Family history of ischemic heart disease and other diseases of the circulatory system: Secondary | ICD-10-CM

## 2024-08-22 DIAGNOSIS — Z91041 Radiographic dye allergy status: Secondary | ICD-10-CM

## 2024-08-22 DIAGNOSIS — I255 Ischemic cardiomyopathy: Secondary | ICD-10-CM | POA: Diagnosis not present

## 2024-08-22 DIAGNOSIS — E440 Moderate protein-calorie malnutrition: Secondary | ICD-10-CM | POA: Diagnosis present

## 2024-08-22 DIAGNOSIS — W108XXA Fall (on) (from) other stairs and steps, initial encounter: Principal | ICD-10-CM

## 2024-08-22 DIAGNOSIS — M4814 Ankylosing hyperostosis [Forestier], thoracic region: Secondary | ICD-10-CM | POA: Diagnosis present

## 2024-08-22 DIAGNOSIS — E669 Obesity, unspecified: Secondary | ICD-10-CM | POA: Diagnosis present

## 2024-08-22 DIAGNOSIS — J9601 Acute respiratory failure with hypoxia: Secondary | ICD-10-CM | POA: Diagnosis not present

## 2024-08-22 DIAGNOSIS — N186 End stage renal disease: Secondary | ICD-10-CM | POA: Diagnosis not present

## 2024-08-22 DIAGNOSIS — I081 Rheumatic disorders of both mitral and tricuspid valves: Secondary | ICD-10-CM | POA: Diagnosis present

## 2024-08-22 DIAGNOSIS — I132 Hypertensive heart and chronic kidney disease with heart failure and with stage 5 chronic kidney disease, or end stage renal disease: Secondary | ICD-10-CM | POA: Diagnosis not present

## 2024-08-22 DIAGNOSIS — J942 Hemothorax: Secondary | ICD-10-CM | POA: Diagnosis present

## 2024-08-22 DIAGNOSIS — D631 Anemia in chronic kidney disease: Secondary | ICD-10-CM | POA: Diagnosis present

## 2024-08-22 DIAGNOSIS — I1310 Hypertensive heart and chronic kidney disease without heart failure, with stage 1 through stage 4 chronic kidney disease, or unspecified chronic kidney disease: Secondary | ICD-10-CM

## 2024-08-22 DIAGNOSIS — Z79899 Other long term (current) drug therapy: Secondary | ICD-10-CM

## 2024-08-22 DIAGNOSIS — I5023 Acute on chronic systolic (congestive) heart failure: Secondary | ICD-10-CM | POA: Diagnosis present

## 2024-08-22 DIAGNOSIS — Z6828 Body mass index (BMI) 28.0-28.9, adult: Secondary | ICD-10-CM

## 2024-08-22 DIAGNOSIS — Z66 Do not resuscitate: Secondary | ICD-10-CM | POA: Diagnosis present

## 2024-08-22 DIAGNOSIS — S22050A Wedge compression fracture of T5-T6 vertebra, initial encounter for closed fracture: Secondary | ICD-10-CM | POA: Diagnosis present

## 2024-08-22 DIAGNOSIS — J918 Pleural effusion in other conditions classified elsewhere: Secondary | ICD-10-CM | POA: Diagnosis present

## 2024-08-22 DIAGNOSIS — Z8674 Personal history of sudden cardiac arrest: Secondary | ICD-10-CM

## 2024-08-22 DIAGNOSIS — K7460 Unspecified cirrhosis of liver: Secondary | ICD-10-CM | POA: Diagnosis present

## 2024-08-22 DIAGNOSIS — E785 Hyperlipidemia, unspecified: Secondary | ICD-10-CM | POA: Diagnosis present

## 2024-08-22 DIAGNOSIS — Z95810 Presence of automatic (implantable) cardiac defibrillator: Secondary | ICD-10-CM

## 2024-08-22 DIAGNOSIS — I272 Pulmonary hypertension, unspecified: Secondary | ICD-10-CM | POA: Diagnosis present

## 2024-08-22 DIAGNOSIS — E871 Hypo-osmolality and hyponatremia: Secondary | ICD-10-CM | POA: Diagnosis present

## 2024-08-22 DIAGNOSIS — I5021 Acute systolic (congestive) heart failure: Secondary | ICD-10-CM | POA: Diagnosis not present

## 2024-08-22 DIAGNOSIS — R5381 Other malaise: Secondary | ICD-10-CM | POA: Diagnosis present

## 2024-08-22 DIAGNOSIS — N400 Enlarged prostate without lower urinary tract symptoms: Secondary | ICD-10-CM | POA: Diagnosis present

## 2024-08-22 DIAGNOSIS — Z888 Allergy status to other drugs, medicaments and biological substances status: Secondary | ICD-10-CM

## 2024-08-22 DIAGNOSIS — G4733 Obstructive sleep apnea (adult) (pediatric): Secondary | ICD-10-CM | POA: Diagnosis present

## 2024-08-22 DIAGNOSIS — K5730 Diverticulosis of large intestine without perforation or abscess without bleeding: Secondary | ICD-10-CM | POA: Diagnosis present

## 2024-08-22 DIAGNOSIS — E119 Type 2 diabetes mellitus without complications: Secondary | ICD-10-CM

## 2024-08-22 HISTORY — PX: IR THORACENTESIS ASP PLEURAL SPACE W/IMG GUIDE: IMG5380

## 2024-08-22 LAB — BODY FLUID CELL COUNT WITH DIFFERENTIAL
Eos, Fluid: 14 %
Lymphs, Fluid: 5 %
Monocyte-Macrophage-Serous Fluid: 12 % — ABNORMAL LOW (ref 50–90)
Neutrophil Count, Fluid: 69 % — ABNORMAL HIGH (ref 0–25)
Total Nucleated Cell Count, Fluid: 1022 uL — ABNORMAL HIGH (ref 0–1000)

## 2024-08-22 LAB — CREATININE, SERUM
Creatinine, Ser: 2.89 mg/dL — ABNORMAL HIGH (ref 0.61–1.24)
GFR, Estimated: 22 mL/min — ABNORMAL LOW (ref >60)

## 2024-08-22 LAB — COMPREHENSIVE METABOLIC PANEL WITH GFR
ALT: 15 U/L (ref 0–44)
AST: 22 U/L (ref 15–41)
Albumin: 3 g/dL — ABNORMAL LOW (ref 3.5–5.0)
Alkaline Phosphatase: 53 U/L (ref 38–126)
Anion gap: 13 (ref 5–15)
BUN: 44 mg/dL — ABNORMAL HIGH (ref 8–23)
CO2: 19 mmol/L — ABNORMAL LOW (ref 22–32)
Calcium: 9.5 mg/dL (ref 8.9–10.3)
Chloride: 103 mmol/L (ref 98–111)
Creatinine, Ser: 2.83 mg/dL — ABNORMAL HIGH (ref 0.61–1.24)
GFR, Estimated: 23 mL/min — ABNORMAL LOW (ref >60)
Glucose, Bld: 105 mg/dL — ABNORMAL HIGH (ref 70–99)
Potassium: 4.1 mmol/L (ref 3.5–5.1)
Sodium: 135 mmol/L (ref 135–145)
Total Bilirubin: 1.5 mg/dL — ABNORMAL HIGH (ref 0.0–1.2)
Total Protein: 6.3 g/dL — ABNORMAL LOW (ref 6.5–8.1)

## 2024-08-22 LAB — SAMPLE TO BLOOD BANK

## 2024-08-22 LAB — CBC WITH DIFFERENTIAL/PLATELET
Abs Immature Granulocytes: 0.03 K/uL (ref 0.00–0.07)
Basophils Absolute: 0 K/uL (ref 0.0–0.1)
Basophils Relative: 0 %
Eosinophils Absolute: 0.2 K/uL (ref 0.0–0.5)
Eosinophils Relative: 3 %
HCT: 28.1 % — ABNORMAL LOW (ref 39.0–52.0)
Hemoglobin: 9 g/dL — ABNORMAL LOW (ref 13.0–17.0)
Immature Granulocytes: 0 %
Lymphocytes Relative: 13 %
Lymphs Abs: 0.9 K/uL (ref 0.7–4.0)
MCH: 29.5 pg (ref 26.0–34.0)
MCHC: 32 g/dL (ref 30.0–36.0)
MCV: 92.1 fL (ref 80.0–100.0)
Monocytes Absolute: 0.7 K/uL (ref 0.1–1.0)
Monocytes Relative: 11 %
Neutro Abs: 5 K/uL (ref 1.7–7.7)
Neutrophils Relative %: 73 %
Platelets: 142 K/uL — ABNORMAL LOW (ref 150–400)
RBC: 3.05 MIL/uL — ABNORMAL LOW (ref 4.22–5.81)
RDW: 14.6 % (ref 11.5–15.5)
WBC: 6.8 K/uL (ref 4.0–10.5)
nRBC: 0 % (ref 0.0–0.2)

## 2024-08-22 LAB — CBC
HCT: 28.3 % — ABNORMAL LOW (ref 39.0–52.0)
Hemoglobin: 9 g/dL — ABNORMAL LOW (ref 13.0–17.0)
MCH: 29.5 pg (ref 26.0–34.0)
MCHC: 31.8 g/dL (ref 30.0–36.0)
MCV: 92.8 fL (ref 80.0–100.0)
Platelets: 156 K/uL (ref 150–400)
RBC: 3.05 MIL/uL — ABNORMAL LOW (ref 4.22–5.81)
RDW: 14.6 % (ref 11.5–15.5)
WBC: 8.4 K/uL (ref 4.0–10.5)
nRBC: 0 % (ref 0.0–0.2)

## 2024-08-22 LAB — BRAIN NATRIURETIC PEPTIDE: B Natriuretic Peptide: 525 pg/mL — ABNORMAL HIGH (ref 0.0–100.0)

## 2024-08-22 LAB — LACTATE DEHYDROGENASE, PLEURAL OR PERITONEAL FLUID: LD, Fluid: 157 U/L — ABNORMAL HIGH (ref 3–23)

## 2024-08-22 LAB — PROTEIN, PLEURAL OR PERITONEAL FLUID: Total protein, fluid: 4.2 g/dL

## 2024-08-22 LAB — PROTIME-INR
INR: 1.6 — ABNORMAL HIGH (ref 0.8–1.2)
Prothrombin Time: 20.1 s — ABNORMAL HIGH (ref 11.4–15.2)

## 2024-08-22 LAB — PROCALCITONIN: Procalcitonin: 0.44 ng/mL

## 2024-08-22 MED ORDER — TORSEMIDE 20 MG PO TABS
60.0000 mg | ORAL_TABLET | Freq: Every day | ORAL | Status: DC
  Administered 2024-08-22 – 2024-08-23 (×2): 60 mg via ORAL
  Filled 2024-08-22 (×3): qty 3

## 2024-08-22 MED ORDER — APIXABAN 5 MG PO TABS
5.0000 mg | ORAL_TABLET | Freq: Two times a day (BID) | ORAL | Status: DC
  Administered 2024-08-22 (×2): 5 mg via ORAL
  Filled 2024-08-22 (×3): qty 1

## 2024-08-22 MED ORDER — LIDOCAINE-EPINEPHRINE 1 %-1:100000 IJ SOLN
INTRAMUSCULAR | Status: AC
Start: 2024-08-22 — End: 2024-08-22
  Filled 2024-08-22: qty 1

## 2024-08-22 MED ORDER — AMLODIPINE BESYLATE 2.5 MG PO TABS
2.5000 mg | ORAL_TABLET | Freq: Every day | ORAL | Status: DC
  Administered 2024-08-22 – 2024-08-23 (×2): 2.5 mg via ORAL
  Filled 2024-08-22 (×2): qty 1

## 2024-08-22 MED ORDER — POLYETHYLENE GLYCOL 3350 17 GM/SCOOP PO POWD
17.0000 g | Freq: Every day | ORAL | Status: DC
  Filled 2024-08-22: qty 119

## 2024-08-22 MED ORDER — IRBESARTAN 150 MG PO TABS
150.0000 mg | ORAL_TABLET | Freq: Every day | ORAL | Status: DC
  Administered 2024-08-22 – 2024-08-23 (×2): 150 mg via ORAL
  Filled 2024-08-22 (×2): qty 1

## 2024-08-22 MED ORDER — ATORVASTATIN CALCIUM 40 MG PO TABS
40.0000 mg | ORAL_TABLET | Freq: Every day | ORAL | Status: DC
  Administered 2024-08-22 – 2024-09-15 (×25): 40 mg via ORAL
  Filled 2024-08-22 (×25): qty 1

## 2024-08-22 MED ORDER — ACETAMINOPHEN 325 MG PO TABS
650.0000 mg | ORAL_TABLET | Freq: Four times a day (QID) | ORAL | Status: DC | PRN
  Administered 2024-08-22 – 2024-08-23 (×3): 650 mg via ORAL
  Filled 2024-08-22 (×3): qty 2

## 2024-08-22 MED ORDER — OXYCODONE-ACETAMINOPHEN 5-325 MG PO TABS
1.0000 | ORAL_TABLET | Freq: Once | ORAL | Status: AC
  Administered 2024-08-22: 1 via ORAL
  Filled 2024-08-22: qty 1

## 2024-08-22 MED ORDER — AMIODARONE HCL 200 MG PO TABS
200.0000 mg | ORAL_TABLET | Freq: Every day | ORAL | Status: DC
  Administered 2024-08-22 – 2024-09-09 (×19): 200 mg via ORAL
  Filled 2024-08-22 (×19): qty 1

## 2024-08-22 MED ORDER — POLYETHYLENE GLYCOL 3350 17 G PO PACK
17.0000 g | PACK | Freq: Every day | ORAL | Status: DC
  Administered 2024-08-24 – 2024-09-15 (×11): 17 g via ORAL
  Filled 2024-08-22 (×19): qty 1

## 2024-08-22 NOTE — Evaluation (Signed)
 Physical Therapy Evaluation Patient Details Name: Dean Holliman Sr. MRN: [id number] DOB: [birth year] Today's Date: 08/22/2024  History of Present Illness  73 yo male s/p fall down 8 stairs in wheelchair. CT (+) T6 vertebral body fx, neurosurgery states no plan for surgery but needs TLSO brace. S/p R thoracentesis 11/28 yielding 500 mL bloody fluid. PMH AICD, arthritis, HLD, HFrEF, ICD, PAF on Eliquis , HTN, CAD, HLD, OSA on cpap, T2DM, CKD, HTN, laminectomy, cardiac arrest (2017).  Clinical Impression   Pt presents with LE weakness, R sided back pain, impaired balance, impaired activity tolerance, decreased knowledge and application of back precautions. Pt to benefit from acute PT to address deficits. Pt ambulated good hallway distance, requiring 2LO2 to maintain sats. Pt noted to be hypotensive (DBP 30s-50s throughout), RN notified but pt asymptomatic. Anticipate good progress acutely, recommend HHPT.  PT to progress mobility as tolerated, and will continue to follow acutely.          If plan is discharge home, recommend the following: A little help with walking and/or transfers;A little help with bathing/dressing/bathroom   Can travel by private vehicle        Equipment Recommendations Rolling walker (2 wheels)  Recommendations for Other Services       Functional Status Assessment Patient has had a recent decline in their functional status and demonstrates the ability to make significant improvements in function in a reasonable and predictable amount of time.     Precautions / Restrictions Precautions Precautions: Fall;Back Recall of Precautions/Restrictions: Intact Required Braces or Orthoses: Spinal Brace Spinal Brace: Thoracolumbosacral orthotic (maintain TLSO orders except for showering, needs clarification but neurosurgery unable to be reached via secure chat to clarify) Restrictions Weight Bearing Restrictions Per Provider Order: No      Mobility  Bed Mobility Overal  bed mobility: Needs Assistance Bed Mobility: Sidelying to Sit, Rolling Rolling: Min assist, Used rails Sidelying to sit: Mod assist, +2 for physical assistance, HOB elevated, Used rails       General bed mobility comments: assist for completion of roll L/R, trunk elevation and LE progression over EOB    Transfers Overall transfer level: Needs assistance Equipment used: Rolling walker (2 wheels) Transfers: Sit to/from Stand Sit to Stand: Min assist, +2 safety/equipment           General transfer comment: light rise assist, pt with good power up once initiated. Cues for hand placement when rising/sitting    Ambulation/Gait Ambulation/Gait assistance: Contact guard assist Gait Distance (Feet): 100 Feet Assistive device: Rolling walker (2 wheels) Gait Pattern/deviations: Step-through pattern, Decreased stride length, Trunk flexed Gait velocity: decr     General Gait Details: cues for proximity to RW, upright posture  Stairs            Wheelchair Mobility     Tilt Bed    Modified Rankin (Stroke Patients Only)       Balance Overall balance assessment: Needs assistance Sitting-balance support: No upper extremity supported, Feet supported Sitting balance-Leahy Scale: Fair     Standing balance support: Bilateral upper extremity supported Standing balance-Leahy Scale: Fair                               Pertinent Vitals/Pain Pain Assessment Pain Assessment: Faces Faces Pain Scale: Hurts little more Pain Location: R side back Pain Descriptors / Indicators: Grimacing, Guarding, Discomfort Pain Intervention(s): Limited activity within patient's tolerance, Monitored during session, Repositioned    Home Living Family/patient  expects to be discharged to:: Private residence Living Arrangements: Children Available Help at Discharge: Family;Available 24 hours/day Type of Home: House Home Access: Level entry     Alternate Level Stairs-Number of  Steps: 13 Home Layout: Two level;Bed/bath upstairs Home Equipment: Control [employer] Instrumentation Engineer (2 wheels);Grab bars - tub/shower      Prior Function Prior Level of Function : Independent/Modified Independent;Driving;Working/employed             Mobility Comments: SPC all of the time, uses a WC for longer distances and energuce conervation ADLs Comments: independent in ADLs, drives, holiday representative part time     Extremity/Trunk Assessment   Upper Extremity Assessment Upper Extremity Assessment: Defer to OT evaluation    Lower Extremity Assessment Lower Extremity Assessment: Generalized weakness (distal edema, history of heart failure)    Cervical / Trunk Assessment Cervical / Trunk Assessment: Other exceptions Cervical / Trunk Exceptions: back fx, forward head/rounded shoulders presentation  Communication   Communication Communication: No apparent difficulties    Cognition Arousal: Alert Behavior During Therapy: WFL for tasks assessed/performed   PT - Cognitive impairments: Problem solving, Safety/Judgement                         Following commands: Impaired Following commands impaired: Follows one step commands with increased time     Cueing Cueing Techniques: Verbal cues     General Comments General comments (skin integrity, edema, etc.): on 4LO2 upon arrival to room, SpO2 98-99% on 2L, so left on 2LO2 and notified RN. BP soft 108/35 in chair with legs elevated, RN called to room to assess and pt asymptomatic.    Exercises     Assessment/Plan    PT Assessment Patient needs continued PT services  PT Problem List Decreased balance;Decreased strength;Decreased activity tolerance;Decreased safety awareness;Decreased mobility;Pain;Cardiopulmonary status limiting activity       PT Treatment Interventions DME instruction;Therapeutic activities;Gait training;Therapeutic exercise;Patient/family education;Balance training;Stair training;Functional  mobility training;Neuromuscular re-education    PT Goals (Current goals can be found in the Care Plan section)  Acute Rehab PT Goals PT Goal Formulation: With patient Time For Goal Achievement: 09/05/24 Potential to Achieve Goals: Good    Frequency Min 2X/week     Co-evaluation   Reason for Co-Treatment: Complexity of the patient's impairments (multi-system involvement);For patient/therapist safety;To address functional/ADL transfers   OT goals addressed during session: ADL's and self-care       AM-PAC PT 6 Clicks Mobility  Outcome Measure Help needed turning from your back to your side while in a flat bed without using bedrails?: A Little Help needed moving from lying on your back to sitting on the side of a flat bed without using bedrails?: A Little Help needed moving to and from a bed to a chair (including a wheelchair)?: A Little Help needed standing up from a chair using your arms (e.g., wheelchair or bedside chair)?: A Little Help needed to walk in hospital room?: A Little Help needed climbing 3-5 steps with a railing? : A Little 6 Click Score: 18    End of Session Equipment Utilized During Treatment: Gait belt Activity Tolerance: Patient tolerated treatment well Patient left: in chair;with call bell/phone within reach;with chair alarm set;with family/visitor present Nurse Communication: Mobility status PT Visit Diagnosis: Other abnormalities of gait and mobility (R26.89);Muscle weakness (generalized) (M62.81)    Time: 8679-8646 PT Time Calculation (min) (ACUTE ONLY): 33 min   Charges:   PT Evaluation $PT Eval Low Complexity: 1 Low   PT  General Charges $$ ACUTE PT VISIT: 1 Visit         Johana RAMAN, PT DPT Acute Rehabilitation Services Secure Chat Preferred  Office [phone]   Johana FORBES [place] 08/22/2024, 2:21 PM

## 2024-08-22 NOTE — ED Triage Notes (Signed)
 Pt coming from home via EMS for evaluation of a fall on Wednesday approximately 7 steps from his wheelchair. Pt reports being on eliquis  and denies any hitting his head or LOC. Pt reports 9/10 lower back pain since the fall. Pt has had no relief of pain with prescribed muscle relaxers. VSS 174/80, HR 80, 94% on RA; hx afib and CHF

## 2024-08-22 NOTE — Consult Note (Signed)
 Surgical Evaluation Requesting provider: Dr. Georgina  Chief Complaint: Fall  HPI: Very pleasant 73 year old man with history of chronic heart failure (ischemic cardiomyopathy), VT/VF arrest and AICD, hypertension, OSA on CPAP, A-fib on Eliquis , type 2 diabetes, and history of spine surgery with lower extremity weakness/numbness who presents after a fall which she sustained 2 days ago.  He misjudged the location of the wheelchair ramp and fell in his wheelchair 7 or 8 steps.  Subsequently developed pain in the mid back to the right of midline.  Has also been experiencing some increased shortness of breath.  He is typically ambulatory with a cane but uses the wheelchair occasionally for longer distances or when he feels his chronic weakness is exacerbated.  Allergies  Allergen Reactions   Contrast Media [Iodinated Contrast Media] Shortness Of Breath and Nausea And Vomiting   Lisinopril  Cough    Past Medical History:  Diagnosis Date   AICD (automatic cardioverter/defibrillator) present 10/10/2016   a. 09/2016 s/p MDT Visia AF MRI single lead ICD (ser # [id number]).   Arthritis    maybe in his spine (09/05/2016)   Asthma    pt. denies   Cardiac arrest (HCC)    a. 08/2016 VT/VF Arrest-->09/2016 s/p MDT Visia AF MRI single lead ICD (ser # [id number]).   Chronic HFrEF (heart failure with reduced ejection fraction) (HCC)    a. 08/2016 Echo: EF 35-40%; b. 04/2018 Echo: EF 30-35%; c. 04/2020 Echo: EF 30-35%; d. 11/2021 Echo: EF 40-45%, GrI DD, nl RV fxn, sev dil LA, mild-mod MR, mild-mod AI, Ao root 38mm.   Coronary artery disease    a. 08/2016 VT Arrest/Cath: LM nl, LAD nl, LCX nl, RGCA 90p, 100d w/ L->R collats, EF 35-45%.   Gout    Hyperlipidemia LDL goal <70    Hypertension    Ischemic cardiomyopathy    a. 08/2016 Echo: EF 35-40%; b. 04/2018 Echo: EF 30-35%; c. 04/2020 Echo: EF 30-35%; d. 11/2021 Echo: EF 40-45%.   LBBB (left bundle branch block)    OSA on CPAP    does not wear CPAP   PAF  (paroxysmal atrial fibrillation) (HCC)    a. CHA2DS2VASc = 5-->Eliquis .   Type II diabetes mellitus (HCC)     Past Surgical History:  Procedure Laterality Date   CARDIAC CATHETERIZATION N/A 09/07/2016   Procedure: Left Heart Cath and Coronary Angiography;  Surgeon: Peter M Jordan, MD;  Location: [facility] INVASIVE CV LAB;  Service: Cardiovascular;  Laterality: N/A;   CENTRAL LINE INSERTION  05/06/2024   Procedure: CENTRAL LINE INSERTION;  Surgeon: Cherrie Toribio SAUNDERS, MD;  Location: [facility] INVASIVE CV LAB;  Service: Cardiovascular;;   COLONOSCOPY WITH PROPOFOL  N/A 08/30/2018   Procedure: COLONOSCOPY WITH PROPOFOL ;  Surgeon: Rollin Dover, MD;  Location: [facility];  Service: Endoscopy;  Laterality: N/A;   COLONOSCOPY WITH PROPOFOL  N/A 11/16/2023   Procedure: COLONOSCOPY WITH PROPOFOL ;  Surgeon: Rollin Dover, MD;  Location: [facility];  Service: Gastroenterology;  Laterality: N/A;   COLONOSCOPY WITH PROPOFOL  N/A 11/18/2023   Procedure: COLONOSCOPY WITH PROPOFOL ;  Surgeon: Charlanne Groom, MD;  Location: [facility];  Service: Gastroenterology;  Laterality: N/A;   EP IMPLANTABLE DEVICE N/A 10/10/2016   Procedure: ICD Implant;  Surgeon: Will Gladis Norton, MD;  Location: [facility] INVASIVE CV LAB;  Service: Cardiovascular;  Laterality: N/A;   HEMOSTASIS CLIP PLACEMENT  11/16/2023   Procedure: HEMOSTASIS CLIP PLACEMENT;  Surgeon: Rollin Dover, MD;  Location: [facility];  Service: Gastroenterology;;   HEMOSTASIS CLIP PLACEMENT  11/18/2023   Procedure:  HEMOSTASIS CLIP PLACEMENT;  Surgeon: Charlanne Groom, MD;  Location: [facility];  Service: Gastroenterology;;   HEMOSTASIS CONTROL  11/18/2023   Procedure: HEMOSTASIS CONTROL;  Surgeon: Charlanne Groom, MD;  Location: [facility];  Service: Gastroenterology;;  PuraStat   LUMBAR LAMINECTOMY/DECOMPRESSION MICRODISCECTOMY N/A 01/18/2018   Procedure: L2-3, L3-4, L-4-5 CENTRAL DECOMPRESSION;  Surgeon: Barbarann Oneil BROCKS, MD;  Location: [facility] OR;  Service: Orthopedics;  Laterality:  N/A;   LUMBAR LAMINECTOMY/DECOMPRESSION MICRODISCECTOMY N/A 08/12/2020   Procedure: THORACIC ELEVEN LAMINECTOMY;  Surgeon: Louis Shove, MD;  Location: [facility] OR;  Service: Neurosurgery;  Laterality: N/A;   POLYPECTOMY  08/30/2018   Procedure: POLYPECTOMY;  Surgeon: Rollin Dover, MD;  Location: [facility];  Service: Endoscopy;;   POLYPECTOMY  11/16/2023   Procedure: POLYPECTOMY;  Surgeon: Rollin Dover, MD;  Location: [facility];  Service: Gastroenterology;;   RIGHT HEART CATH N/A 05/06/2024   Procedure: RIGHT HEART CATH;  Surgeon: Cherrie Toribio SAUNDERS, MD;  Location: [facility] INVASIVE CV LAB;  Service: Cardiovascular;  Laterality: N/A;   TRANSESOPHAGEAL ECHOCARDIOGRAM (CATH LAB) N/A 06/09/2024   Procedure: TRANSESOPHAGEAL ECHOCARDIOGRAM;  Surgeon: [name] Morene PARAS, MD;  Location: [facility] INVASIVE CV LAB;  Service: Cardiovascular;  Laterality: N/A;    Family History  Problem Relation Age of Onset   Heart attack Father        died in his 83's   Hypertension Sister    Cancer Brother        uncertain, leg   Hypertension Sister     Social History   Socioeconomic History   Marital status: Widowed    Spouse name: Not on file   Number of children: Not on file   Years of education: Not on file   Highest education level: Not on file  Occupational History   Occupation: semi-retired proofreader  Tobacco Use   Smoking status: Never   Smokeless tobacco: Never  Vaping Use   Vaping status: Never Used  Substance and Sexual Activity   Alcohol use: No   Drug use: No   Sexual activity: Not on file  Other Topics Concern   Not on file  Social History Narrative   Lives with daughters.    Social Drivers of Corporate Investment Banker Strain: Not on file  Food Insecurity: No Food Insecurity (07/10/2024)   Hunger Vital Sign    Worried About Running Out of Food in the Last Year: Never true    Ran Out of Food in the Last Year: Never true  Transportation Needs: No Transportation Needs (07/10/2024)    PRAPARE - Administrator, Civil Service (Medical): No    Lack of Transportation (Non-Medical): No  Physical Activity: Not on file  Stress: Not on file  Social Connections: Moderately Integrated (07/10/2024)   Social Connection and Isolation Panel    Frequency of Communication with Friends and Family: More than three times a week    Frequency of Social Gatherings with Friends and Family: More than three times a week    Attends Religious Services: More than 4 times per year    Active Member of [employer] or Organizations: Yes    Attends Banker Meetings: 1 to 4 times per year    Marital Status: Widowed  Recent Concern: Social Connections - Moderately Isolated (04/20/2024)   Social Connection and Isolation Panel    Frequency of Communication with Friends and Family: More than three times a week    Frequency of Social Gatherings with Friends and Family: More than three times  a week    Attends Religious Services: More than 4 times per year    Active Member of Clubs or Organizations: No    Attends Banker Meetings: Never    Marital Status: Widowed    No current facility-administered medications on file prior to encounter.   Current Outpatient Medications on File Prior to Encounter  Medication Sig Dispense Refill   furosemide  (LASIX ) 20 MG tablet Take 20 mg by mouth daily.     olmesartan (BENICAR) 20 MG tablet Take 20 mg by mouth daily.     tiZANidine (ZANAFLEX) 2 MG tablet Take 2 mg by mouth 3 (three) times daily.     zolpidem  (AMBIEN ) 5 MG tablet Take 5 mg by mouth at bedtime.     allopurinol  (ZYLOPRIM ) 300 MG tablet Take 150 mg by mouth daily.     amiodarone  (PACERONE ) 200 MG tablet Take 1 tablet (200 mg total) by mouth daily. 90 tablet 1   amLODipine  (NORVASC ) 2.5 MG tablet Take 2.5 mg by mouth daily.     apixaban  (ELIQUIS ) 5 MG TABS tablet TAKE 1 TABLET BY MOUTH TWICE A DAY 180 tablet 1   atorvastatin  (LIPITOR) 40 MG tablet Take 40 mg by mouth daily.      cholecalciferol  (VITAMIN D3) 25 MCG (1000 UNIT) tablet Take 1,000 Units by mouth daily.     ferrous sulfate  325 (65 FE) MG tablet Take 325 mg by mouth daily.     furosemide  (LASIX ) 40 MG tablet Take 40 mg by mouth daily. (Patient not taking: Reported on 08/22/2024)     hydrALAZINE  (APRESOLINE ) 50 MG tablet SMARTSIG:1.5 Tablet(s) By Mouth Every 8 Hours (Patient not taking: Reported on 08/22/2024)     isosorbide  mononitrate (IMDUR ) 60 MG 24 hr tablet Take 0.5 tablets (30 mg total) by mouth daily.     polyethylene glycol powder (GLYCOLAX /MIRALAX ) 17 GM/SCOOP powder Take 17 g by mouth daily. (Patient taking differently: Take 17 g by mouth daily as needed for mild constipation.) 255 g 0   potassium chloride  SA (KLOR-CON  M) 20 MEQ tablet Take 1 tablet (20 mEq total) by mouth daily. 30 tablet 0   torsemide  (DEMADEX ) 20 MG tablet Take 3 tablets (60 mg total) by mouth daily. 90 tablet 1    Review of Systems: a complete, 10pt review of systems was completed with pertinent positives and negatives as documented in the HPI  Physical Exam: Vitals:   08/22/24 0624 08/22/24 1005  BP: (!) 134/49 (!) 134/49  Pulse: 91   Resp: (!) 25   Temp:    SpO2: 100%    Gen: A&Ox3, no distress  Eyes: lids and conjunctivae normal, no icterus. Pupils equally round and reactive to light.  Neck: supple without mass or thyromegaly Chest: respiratory effort is normal on nasal cannula. No crepitus or tenderness on palpation of the chest.  Cardiovascular: Bilateral lower extremity edema Gastrointestinal: soft, nondistended, nontender. MSK; tender to light palpation along mid back midline into the right of midline Psych: appropriate mood and affect, normal insight/judgment intact  Skin: warm and dry      Latest Ref Rng & Units 08/22/2024    9:03 AM 08/22/2024    4:18 AM 07/12/2024    5:00 AM  CBC  WBC 4.0 - 10.5 K/uL 8.4  6.8  8.9   Hemoglobin 13.0 - 17.0 g/dL 9.0  9.0  89.5   Hematocrit 39.0 - 52.0 % 28.3  28.1   31.9   Platelets 150 - 400 K/uL 156  142  108        Latest Ref Rng & Units 08/22/2024    9:03 AM 08/22/2024    4:18 AM 07/19/2024    6:00 AM  CMP  Glucose 70 - 99 mg/dL  894  99   BUN 8 - 23 mg/dL  44  84   Creatinine 9.38 - 1.24 mg/dL 7.10  7.16  7.51   Sodium 135 - 145 mmol/L  135  129   Potassium 3.5 - 5.1 mmol/L  4.1  3.7   Chloride 98 - 111 mmol/L  103  85   CO2 22 - 32 mmol/L  19  32   Calcium  8.9 - 10.3 mg/dL  9.5  9.8   Total Protein 6.5 - 8.1 g/dL  6.3    Total Bilirubin 0.0 - 1.2 mg/dL  1.5    Alkaline Phos 38 - 126 U/L  53    AST 15 - 41 U/L  22    ALT 0 - 44 U/L  15      Lab Results  Component Value Date   INR 1.6 (H) 08/22/2024   INR 1.8 (H) 11/17/2023   INR 1.5 (H) 08/12/2020    Imaging: CT L-SPINE NO CHARGE Result Date: 08/22/2024 EXAM: CT CHEST ABDOMEN PELVIS WITH THORACIC AND LUMBAR SPINE RECONSTRUCTIONS 08/22/2024 03:44:00 AM TECHNIQUE: CT of the chest, abdomen, pelvis was performed without the administration of intravenous contrast. Multiplanar reformatted images are provided for review, including reconstructed images of the thoracic and lumbar spine. Automated exposure control, iterative reconstruction, and/or weight based adjustment of the mA/kV was utilized to reduce the radiation dose to as low as reasonably achievable. COMPARISON: Portable chest 07/10/2024, chest CT without contrast 07/10/2024, chest CT high resolution study 09/08/2019, abdomen and pelvis CT without contrast 05/04/2024, abdomen and pelvis CT without contrast 04/01/2024. CLINICAL HISTORY: Polytrauma, blunt. Patient fell down an unknown number of steps from his wheelchair. FINDINGS: CT CHEST: THORACIC AORTA: The aorta and great vessels are moderate to heavily calcified. No acute traumatic injury of the aorta. MEDIASTINUM: Single lead left chest pacemaker/aid, stable right ventricular wire insertion. The heart is moderately enlarged. No pericardial effusion. Left main and patchy 3-vessel  coronary calcifications. Mild central pulmonary venous distention again noted with prominent pulmonary trunk measuring 3.4 cm, indicating arterial hypertension. A 1.2 cm precarinal lymph node to the right is again noted with similar sized left-sided prevascular nodes also again seen. Patulous esophagus with normal wall thickness. The trachea and main bronchi are patent. There is mild diffuse bronchial thickening. No mediastinal hematoma or pneumomediastinum. No acute traumatic injury to the heart or pericardium. LUNGS: Interval increased moderate sized right pleural effusion. Most of this does layer posteriorly, but some of it could be loculated in the chest base anteriorly. The density of the right pleural effusion is greater than simple fluid, measuring up to 33 Hounsfield units. A component of pleural hemorrhage or proteinaceous fluid such as due to infection or empyema, is not excluded. There is no air in the pleural fluid and no pneumothorax. There is consolidation versus compressive collapse over a majority of the right middle and lower lobes. Underlying pneumonia is a consideration. Nodules previously newly identified in the base of the right upper lobe and in the right lower lobe, would be obscured by the pleural fluid if still present. Mosaic attenuation of the lungs continues to be seen, consistent with air trapping and small airway disease. The lungs are otherwise clear. No acute traumatic injury to the lungs.  No pulmonary contusion or laceration. There previously was a small left pleural effusion, which has cleared. CHEST WALL: No acute displaced rib fracture. No chest wall hematoma. No chest wall mass. CT ABDOMEN AND PELVIS: ABDOMINAL AORTA: Heavy aortic and branch vessel atherosclerosis without aneurysm. No acute traumatic injury of the aorta or iliac arteries. HEPATOBILIARY: Hypoattenuating lesions in the dome of the liver, measuring 4.6 x 3.9 x 2 cm in segments 8 and 2 have been previously  characterized as hemangiomas on a study of 09/19/2018. There have been no significant interval changes. The liver is dense, which could be seen with amiodarone  or heavy metal toxicity as well as hemochromatosis. There is cholelithiasis without gallbladder wall thickening or biliary dilatation. No acute liver abnormality is seen without contrast. SPLEEN: No acute traumatic injury. PANCREAS: No acute traumatic injury. ADRENAL GLANDS: No adrenal hemorrhage is seen. No acute traumatic injury. KIDNEYS: No renal or perinephric hemorrhage is seen. There is chronic perinephric stranding. No urinary stone or obstruction. No contour deforming mass of the unenhanced kidneys is seen. No acute traumatic injury. No hydronephrosis. GI TRACT: There is no bowel obstruction or inflammation. The gastric wall is unremarkable. The appendix is not seen. There is advanced sigmoid diverticulosis without diverticulitis. Small fat-containing umbilical hernia. No acute traumatic injury of the bowel. PERITONEUM: No ascites or free air. No free fluid or free hemorrhage. RETROPERITONEUM: No retroperitoneal hematoma. BLADDER: The bladder is thickened but also not fully distended. Correlate clinically for cystitis. REPRODUCTIVE ORGANS: There are dystrophic calcifications within a mildly enlarged prostate gland. BONES: No acute traumatic fracture of the pelvis. 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 No acute regional skeletal fracture at the levels of the abdomen and pelvis. SOFT TISSUES: There is increased skin thickening and subcutaneous stranding over the low anterior abdominal wall, which could be congestive or due to cellulitis. Mild anasarca is seen in portions of the remaining body wall. THORACIC AND LUMBAR SPINE: BONES AND ALIGNMENT: *   **Thoracic Spine:** * Acute oblique nondisplaced fracture through the mid to anterior T6 vertebral body extending through bridging enthesopathic bone, without evidence of  fracture transmission into the posterior elements and pedicles. There is no loss of vertebral height at T6. * Broad-based mid to lower thoracic kyphosis centered at T10-T11, where there is again noted bulky bridging bone anteriorly between the segments, moderate wedging of both segments, with interbody ankylosis consistent with old injury, with old spinous process removal at both levels with dorsal bone graft fusion. . * The rest of the thoracic spine also has extensive bridging enthesopathy beginning at T2, resulting in multilevel interbody fusions. This is eccentric to the right anterolateral aspect. There is ankylosis over multiple spinous processes. Facet joints are also fused at multiple levels with facet hypertrophy. * Above and below T10 and T11 the thoracic vertebrae are normal in height. There is osteopenia. *   **Lumbar Spine:** *   No acute fracture is evident. * Chronic grade 1 degenerative anterolisthesis at L4-L5 without other abnormal alignment. * Advanced lumbar marginal osteophytosis with bulky bridging osteophytes T12-L1, L1-L2, L2-L3, and L4-L5 with attempted bridging osteophytes at L3-L4 and L5-S1. * The discs are of normal height apart from chronic disc space widening at L3-L4. Thank you *   Advanced facet hypertrophy most levels. * The spinous processes were previously removed at L2, L3, and L4 with spinous process shaving at L5. DEGENERATIVE CHANGES: * **Thoracic Spine:** Varying degrees of multilevel acquired foraminal stenosis, but this is most  severe on the left at T1-T2, on the right at T4-T5, on the right greater than left at T10-T11, on the right at T11-T12. Posterior endplate ridging at the level of T10-T11 does flatten the ventral thecal sac and cause 7 mm thecal sac AP stenosis and probable mild flattening of the ventral cord without frank impression. Other levels do not show noteworthy soft tissue or bony encroachment on the thecal sac. *   **Lumbar Spine:** Acquired spinal stenosis  is noted all levels except L1-L2, due to ligamentous facet hypertrophy and posterior endplate ridging, but it is most severe at L3-L4 and L4-L5. Foraminal stenosis is moderate to severe bilaterally from L2-L3 through L4-L5. SOFT TISSUES: * **Thoracic Spine:** Mild edema in the adjacent soft tissues at T6, but there is no appreciable spinal canal hematoma. No paraspinal mass or hematoma. *   **Lumbar Spine:** No paraspinal mass or hematoma. IMPRESSION: 1. Acute oblique nondisplaced mid to anterior T6 vertebral body fracture extending through bridging enthesopathic bone without posterior element involvement, no vertebral height loss, with mild adjacent soft tissue edema and no appreciable spinal canal hematoma. 2. Interval increased moderate-sized right pleural effusion demonstrating increased attenuation suggesting hemorrhagic or proteinaceous content; no pneumothorax. The rib cage is intact . 3. Right middle and lower lobe consolidation versus compressive atelectasis, with pneumonia and secondary empyema a consideration. 4. 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 5. Severe lumbar spinal stenosis at L3-L4 and L4-L5 with moderate to severe bilateral foraminal stenosis from L2-3 through L4-5. 6. Enlarged prostate. 7. Dense liver suggesting metallic or amiodarone  toxicity. 8. Cholelithiasis. 9. Cardiomegaly with aortic and coronary atherosclerosis. 10. Body wall edema, with increased skin thickening and stranding in the lower end of the ear canal which could represent cerumen or mastoid cellulitis. 11. Advanced sigmoid diverticulosis and additional detailed findings discussed above. Electronically signed by: Francis Quam MD 08/22/2024 05:00 AM EST RP Workstation: [id number]   CT T-SPINE NO CHARGE Result  Date: 08/22/2024 EXAM: CT CHEST ABDOMEN PELVIS WITH THORACIC AND LUMBAR SPINE RECONSTRUCTIONS 08/22/2024 03:44:00 AM TECHNIQUE: CT of the chest, abdomen, pelvis was performed without the administration of intravenous contrast. Multiplanar reformatted images are provided for review, including reconstructed images of the thoracic and lumbar spine. Automated exposure control, iterative reconstruction, and/or weight based adjustment of the mA/kV was utilized to reduce the radiation dose to as low as reasonably achievable. COMPARISON: Portable chest 07/10/2024, chest CT without contrast 07/10/2024, chest CT high resolution study 09/08/2019, abdomen and pelvis CT without contrast 05/04/2024, abdomen and pelvis CT without contrast 04/01/2024. CLINICAL HISTORY: Polytrauma, blunt. Patient fell down an unknown number of steps from his wheelchair. FINDINGS: CT CHEST: THORACIC AORTA: The aorta and great vessels are moderate to heavily calcified. No acute traumatic injury of the aorta. MEDIASTINUM: Single lead left chest pacemaker/aid, stable right ventricular wire insertion. The heart is moderately enlarged. No pericardial effusion. Left main and patchy 3-vessel coronary calcifications. Mild central pulmonary venous distention again noted with prominent pulmonary trunk measuring 3.4 cm, indicating arterial hypertension. A 1.2 cm precarinal lymph node to the right is again noted with similar sized left-sided prevascular nodes also again seen. Patulous esophagus with normal wall thickness. The trachea and main bronchi are patent. There is mild diffuse bronchial thickening. No mediastinal hematoma or pneumomediastinum. No acute traumatic injury to the heart or pericardium. LUNGS: Interval increased moderate  sized right pleural effusion. Most of this does layer posteriorly, but some of it could be loculated in the chest base anteriorly. The density of the right pleural effusion is greater than simple fluid, measuring up to 33  Hounsfield units. A component of pleural hemorrhage or proteinaceous fluid such as due to infection or empyema, is not excluded. There is no air in the pleural fluid and no pneumothorax. There is consolidation versus compressive collapse over a majority of the right middle and lower lobes. Underlying pneumonia is a consideration. Nodules previously newly identified in the base of the right upper lobe and in the right lower lobe, would be obscured by the pleural fluid if still present. Mosaic attenuation of the lungs continues to be seen, consistent with air trapping and small airway disease. The lungs are otherwise clear. No acute traumatic injury to the lungs. No pulmonary contusion or laceration. There previously was a small left pleural effusion, which has cleared. CHEST WALL: No acute displaced rib fracture. No chest wall hematoma. No chest wall mass. CT ABDOMEN AND PELVIS: ABDOMINAL AORTA: Heavy aortic and branch vessel atherosclerosis without aneurysm. No acute traumatic injury of the aorta or iliac arteries. HEPATOBILIARY: Hypoattenuating lesions in the dome of the liver, measuring 4.6 x 3.9 x 2 cm in segments 8 and 2 have been previously characterized as hemangiomas on a study of 09/19/2018. There have been no significant interval changes. The liver is dense, which could be seen with amiodarone  or heavy metal toxicity as well as hemochromatosis. There is cholelithiasis without gallbladder wall thickening or biliary dilatation. No acute liver abnormality is seen without contrast. SPLEEN: No acute traumatic injury. PANCREAS: No acute traumatic injury. ADRENAL GLANDS: No adrenal hemorrhage is seen. No acute traumatic injury. KIDNEYS: No renal or perinephric hemorrhage is seen. There is chronic perinephric stranding. No urinary stone or obstruction. No contour deforming mass of the unenhanced kidneys is seen. No acute traumatic injury. No hydronephrosis. GI TRACT: There is no bowel obstruction or inflammation.  The gastric wall is unremarkable. The appendix is not seen. There is advanced sigmoid diverticulosis without diverticulitis. Small fat-containing umbilical hernia. No acute traumatic injury of the bowel. PERITONEUM: No ascites or free air. No free fluid or free hemorrhage. RETROPERITONEUM: No retroperitoneal hematoma. BLADDER: The bladder is thickened but also not fully distended. Correlate clinically for cystitis. REPRODUCTIVE ORGANS: There are dystrophic calcifications within a mildly enlarged prostate gland. BONES: No acute traumatic fracture of the pelvis. 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 No acute regional skeletal fracture at the levels of the abdomen and pelvis. SOFT TISSUES: There is increased skin thickening and subcutaneous stranding over the low anterior abdominal wall, which could be congestive or due to cellulitis. Mild anasarca is seen in portions of the remaining body wall. THORACIC AND LUMBAR SPINE: BONES AND ALIGNMENT: *   **Thoracic Spine:** * Acute oblique nondisplaced fracture through the mid to anterior T6 vertebral body extending through bridging enthesopathic bone, without evidence of fracture transmission into the posterior elements and pedicles. There is no loss of vertebral height at T6. * Broad-based mid to lower thoracic kyphosis centered at T10-T11, where there is again noted bulky bridging bone anteriorly between the segments, moderate wedging of both segments, with interbody ankylosis consistent with old injury, with old spinous process removal at both levels with dorsal bone graft fusion. . * The rest of the thoracic spine also has extensive bridging enthesopathy beginning at T2, resulting in multilevel interbody fusions. This is  eccentric to the right anterolateral aspect. There is ankylosis over multiple spinous processes. Facet joints are also fused at multiple levels with facet hypertrophy. * Above and below T10 and T11 the thoracic vertebrae  are normal in height. There is osteopenia. *   **Lumbar Spine:** *   No acute fracture is evident. * Chronic grade 1 degenerative anterolisthesis at L4-L5 without other abnormal alignment. * Advanced lumbar marginal osteophytosis with bulky bridging osteophytes T12-L1, L1-L2, L2-L3, and L4-L5 with attempted bridging osteophytes at L3-L4 and L5-S1. * The discs are of normal height apart from chronic disc space widening at L3-L4. Thank you *   Advanced facet hypertrophy most levels. * The spinous processes were previously removed at L2, L3, and L4 with spinous process shaving at L5. DEGENERATIVE CHANGES: * **Thoracic Spine:** Varying degrees of multilevel acquired foraminal stenosis, but this is most severe on the left at T1-T2, on the right at T4-T5, on the right greater than left at T10-T11, on the right at T11-T12. Posterior endplate ridging at the level of T10-T11 does flatten the ventral thecal sac and cause 7 mm thecal sac AP stenosis and probable mild flattening of the ventral cord without frank impression. Other levels do not show noteworthy soft tissue or bony encroachment on the thecal sac. *   **Lumbar Spine:** Acquired spinal stenosis is noted all levels except L1-L2, due to ligamentous facet hypertrophy and posterior endplate ridging, but it is most severe at L3-L4 and L4-L5. Foraminal stenosis is moderate to severe bilaterally from L2-L3 through L4-L5. SOFT TISSUES: * **Thoracic Spine:** Mild edema in the adjacent soft tissues at T6, but there is no appreciable spinal canal hematoma. No paraspinal mass or hematoma. *   **Lumbar Spine:** No paraspinal mass or hematoma. IMPRESSION: 1. Acute oblique nondisplaced mid to anterior T6 vertebral body fracture extending through bridging enthesopathic bone without posterior element involvement, no vertebral height loss, with mild adjacent soft tissue edema and no appreciable spinal canal hematoma. 2. Interval increased moderate-sized right pleural effusion  demonstrating increased attenuation suggesting hemorrhagic or proteinaceous content; no pneumothorax. The rib cage is intact . 3. Right middle and lower lobe consolidation versus compressive atelectasis, with pneumonia and secondary empyema a consideration. 4. 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 5. Severe lumbar spinal stenosis at L3-L4 and L4-L5 with moderate to severe bilateral foraminal stenosis from L2-3 through L4-5. 6. Enlarged prostate. 7. Dense liver suggesting metallic or amiodarone  toxicity. 8. Cholelithiasis. 9. Cardiomegaly with aortic and coronary atherosclerosis. 10. Body wall edema, with increased skin thickening and stranding in the lower end of the ear canal which could represent cerumen or mastoid cellulitis. 11. Advanced sigmoid diverticulosis and additional detailed findings discussed above. Electronically signed by: Francis Quam MD 08/22/2024 05:00 AM EST RP Workstation: [id number]   CT CHEST ABDOMEN PELVIS WO CONTRAST Result Date: 08/22/2024 EXAM: CT CHEST ABDOMEN PELVIS WITH THORACIC AND LUMBAR SPINE RECONSTRUCTIONS 08/22/2024 03:44:00 AM TECHNIQUE: CT of the chest, abdomen, pelvis was performed without the administration of intravenous contrast. Multiplanar reformatted images are provided for review, including reconstructed images of the thoracic and lumbar spine. Automated exposure control, iterative reconstruction, and/or weight based adjustment of the mA/kV was utilized to reduce the radiation dose to as low as reasonably achievable. COMPARISON: Portable chest 07/10/2024, chest CT without contrast 07/10/2024, chest CT high resolution study 09/08/2019, abdomen and pelvis  CT without contrast 05/04/2024, abdomen and pelvis CT without contrast 04/01/2024. CLINICAL HISTORY:  Polytrauma, blunt. Patient fell down an unknown number of steps from his wheelchair. FINDINGS: CT CHEST: THORACIC AORTA: The aorta and great vessels are moderate to heavily calcified. No acute traumatic injury of the aorta. MEDIASTINUM: Single lead left chest pacemaker/aid, stable right ventricular wire insertion. The heart is moderately enlarged. No pericardial effusion. Left main and patchy 3-vessel coronary calcifications. Mild central pulmonary venous distention again noted with prominent pulmonary trunk measuring 3.4 cm, indicating arterial hypertension. A 1.2 cm precarinal lymph node to the right is again noted with similar sized left-sided prevascular nodes also again seen. Patulous esophagus with normal wall thickness. The trachea and main bronchi are patent. There is mild diffuse bronchial thickening. No mediastinal hematoma or pneumomediastinum. No acute traumatic injury to the heart or pericardium. LUNGS: Interval increased moderate sized right pleural effusion. Most of this does layer posteriorly, but some of it could be loculated in the chest base anteriorly. The density of the right pleural effusion is greater than simple fluid, measuring up to 33 Hounsfield units. A component of pleural hemorrhage or proteinaceous fluid such as due to infection or empyema, is not excluded. There is no air in the pleural fluid and no pneumothorax. There is consolidation versus compressive collapse over a majority of the right middle and lower lobes. Underlying pneumonia is a consideration. Nodules previously newly identified in the base of the right upper lobe and in the right lower lobe, would be obscured by the pleural fluid if still present. Mosaic attenuation of the lungs continues to be seen, consistent with air trapping and small airway disease. The lungs are otherwise clear. No acute traumatic injury to the lungs. No pulmonary contusion or laceration. There previously was a small left pleural effusion, which has  cleared. CHEST WALL: No acute displaced rib fracture. No chest wall hematoma. No chest wall mass. CT ABDOMEN AND PELVIS: ABDOMINAL AORTA: Heavy aortic and branch vessel atherosclerosis without aneurysm. No acute traumatic injury of the aorta or iliac arteries. HEPATOBILIARY: Hypoattenuating lesions in the dome of the liver, measuring 4.6 x 3.9 x 2 cm in segments 8 and 2 have been previously characterized as hemangiomas on a study of 09/19/2018. There have been no significant interval changes. The liver is dense, which could be seen with amiodarone  or heavy metal toxicity as well as hemochromatosis. There is cholelithiasis without gallbladder wall thickening or biliary dilatation. No acute liver abnormality is seen without contrast. SPLEEN: No acute traumatic injury. PANCREAS: No acute traumatic injury. ADRENAL GLANDS: No adrenal hemorrhage is seen. No acute traumatic injury. KIDNEYS: No renal or perinephric hemorrhage is seen. There is chronic perinephric stranding. No urinary stone or obstruction. No contour deforming mass of the unenhanced kidneys is seen. No acute traumatic injury. No hydronephrosis. GI TRACT: There is no bowel obstruction or inflammation. The gastric wall is unremarkable. The appendix is not seen. There is advanced sigmoid diverticulosis without diverticulitis. Small fat-containing umbilical hernia. No acute traumatic injury of the bowel. PERITONEUM: No ascites or free air. No free fluid or free hemorrhage. RETROPERITONEUM: No retroperitoneal hematoma. BLADDER: The bladder is thickened but also not fully distended. Correlate clinically for cystitis. REPRODUCTIVE ORGANS: There are dystrophic calcifications within a mildly enlarged prostate gland. BONES: No acute traumatic fracture of the pelvis. 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 No acute regional skeletal fracture at the levels of the abdomen and pelvis. SOFT TISSUES: There is increased skin  thickening and  subcutaneous stranding over the low anterior abdominal wall, which could be congestive or due to cellulitis. Mild anasarca is seen in portions of the remaining body wall. THORACIC AND LUMBAR SPINE: BONES AND ALIGNMENT: *   **Thoracic Spine:** * Acute oblique nondisplaced fracture through the mid to anterior T6 vertebral body extending through bridging enthesopathic bone, without evidence of fracture transmission into the posterior elements and pedicles. There is no loss of vertebral height at T6. * Broad-based mid to lower thoracic kyphosis centered at T10-T11, where there is again noted bulky bridging bone anteriorly between the segments, moderate wedging of both segments, with interbody ankylosis consistent with old injury, with old spinous process removal at both levels with dorsal bone graft fusion. . * The rest of the thoracic spine also has extensive bridging enthesopathy beginning at T2, resulting in multilevel interbody fusions. This is eccentric to the right anterolateral aspect. There is ankylosis over multiple spinous processes. Facet joints are also fused at multiple levels with facet hypertrophy. * Above and below T10 and T11 the thoracic vertebrae are normal in height. There is osteopenia. *   **Lumbar Spine:** *   No acute fracture is evident. * Chronic grade 1 degenerative anterolisthesis at L4-L5 without other abnormal alignment. * Advanced lumbar marginal osteophytosis with bulky bridging osteophytes T12-L1, L1-L2, L2-L3, and L4-L5 with attempted bridging osteophytes at L3-L4 and L5-S1. * The discs are of normal height apart from chronic disc space widening at L3-L4. Thank you *   Advanced facet hypertrophy most levels. * The spinous processes were previously removed at L2, L3, and L4 with spinous process shaving at L5. DEGENERATIVE CHANGES: * **Thoracic Spine:** Varying degrees of multilevel acquired foraminal stenosis, but this is most severe on the left at T1-T2, on the right at T4-T5, on the  right greater than left at T10-T11, on the right at T11-T12. Posterior endplate ridging at the level of T10-T11 does flatten the ventral thecal sac and cause 7 mm thecal sac AP stenosis and probable mild flattening of the ventral cord without frank impression. Other levels do not show noteworthy soft tissue or bony encroachment on the thecal sac. *   **Lumbar Spine:** Acquired spinal stenosis is noted all levels except L1-L2, due to ligamentous facet hypertrophy and posterior endplate ridging, but it is most severe at L3-L4 and L4-L5. Foraminal stenosis is moderate to severe bilaterally from L2-L3 through L4-L5. SOFT TISSUES: * **Thoracic Spine:** Mild edema in the adjacent soft tissues at T6, but there is no appreciable spinal canal hematoma. No paraspinal mass or hematoma. *   **Lumbar Spine:** No paraspinal mass or hematoma. IMPRESSION: 1. Acute oblique nondisplaced mid to anterior T6 vertebral body fracture extending through bridging enthesopathic bone without posterior element involvement, no vertebral height loss, with mild adjacent soft tissue edema and no appreciable spinal canal hematoma. 2. Interval increased moderate-sized right pleural effusion demonstrating increased attenuation suggesting hemorrhagic or proteinaceous content; no pneumothorax. The rib cage is intact . 3. Right middle and lower lobe consolidation versus compressive atelectasis, with pneumonia and secondary empyema a consideration. 4. 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 5. Severe lumbar spinal stenosis at L3-L4 and L4-L5 with moderate to severe bilateral foraminal stenosis from L2-3 through L4-5. 6. Enlarged prostate. 7. Dense liver suggesting metallic or amiodarone  toxicity. 8. Cholelithiasis. 9. Cardiomegaly with aortic  and coronary atherosclerosis. 10. Body wall edema, with increased skin thickening and stranding in the lower end of the ear canal which could represent cerumen or mastoid cellulitis. 11. Advanced sigmoid diverticulosis and additional detailed findings discussed above. Electronically signed by: Francis Quam MD 08/22/2024 05:00 AM EST RP Workstation: [id number]     A/P: 73 year old male with multiple medical problems who sustained a fall a few days ago  -T6 vb fx per NSG, TLSO brace - Moderate right pleural effusion (does have small bilateral pleural effusions on CT in mid October) without rib fx, with atelectasis - new oxygen requirement and SOB when lying down. Recommend IR eval for thoracentesis +/- pigtail placement, will place order -Aggressive pulmonary toilet, mobilize as able   -multilevel foraminal/spinal stenoses/ severe degenerative disease  -dense liver suggesting metallic or amiodarone  toxicity -Caridomegaly and aortic/acoronary atherosclerosis -Sigmoid diverticulosis  Patient Active Problem List   Diagnosis Date Noted   Multiple traumatic injuries 08/22/2024   Malnutrition of moderate degree 07/12/2024   HFrEF (heart failure with reduced ejection fraction) (HCC) 07/10/2024   Thrombocytopenia 07/10/2024   Anemia of chronic disease 07/10/2024   Abnormal CT of the chest 07/10/2024   Acute on chronic systolic CHF (congestive heart failure) (HCC) 05/04/2024   SOB (shortness of breath) 05/04/2024   Elevated troponin 05/04/2024   High anion gap metabolic acidosis 05/04/2024   Paroxysmal atrial fibrillation (HCC) 05/04/2024   Essential hypertension 05/04/2024   Obesity, class 1 05/04/2024   Acute on chronic heart failure (HCC) 04/20/2024   Acute GI bleeding 11/17/2023   Ventricular tachycardia (HCC) 08/24/2022   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/2020   Atrial fibrillation, chronic (HCC) 08/12/2020   Thoracic spine fracture (HCC) 06/29/2020   Hyperkalemia  10/23/2019   Diarrhea 10/23/2019   CHF (congestive heart failure) (HCC) 06/04/2019   Acute systolic CHF (congestive heart failure) (HCC) 06/04/2019   Severe sepsis (HCC) 05/06/2019   Acute asthma exacerbation 05/06/2019   Acute respiratory distress 05/06/2019   Acute hypoxic respiratory failure (HCC) 04/06/2019   HCAP (healthcare-associated pneumonia) 04/06/2019   Acute kidney injury superimposed on chronic kidney disease 02/27/2019   Chronic combined systolic and diastolic CHF (congestive heart failure) (HCC) 02/27/2019   CKD (chronic kidney disease), stage III (HCC) 05/16/2018   Normochromic normocytic anemia 05/15/2018   CAD (coronary artery disease) 12/17/2017   HLD (hyperlipidemia) 12/17/2017   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   Cardiomyopathy, ischemic    Generalized weakness 10/04/2016   Stage 3 chronic kidney disease (HCC)    Chronic kidney disease, stage 3b (HCC)    Disorientated    Anoxic brain injury (HCC) 09/15/2016   Benign essential HTN    OSA (obstructive sleep apnea)    Transaminitis    Chronic respiratory failure with hypoxia (HCC)    Fever    Acute cystitis    NSVT (nonsustained ventricular tachycardia) (HCC)    Uncomplicated asthma    Type 2 diabetes mellitus in patient with obesity (HCC)    Acute on chronic combined systolic and diastolic CHF (congestive heart failure) (HCC)    Tachypnea    Hypokalemia    Leukocytosis    Hypoxemia    Acute respiratory failure (HCC)    Cardiopulmonary arrest (HCC) 08/27/2016   Type 2 diabetes mellitus with hyperlipidemia (HCC) 03/02/2014   Class 2 obesity due to excess calories with body mass index (BMI) of 35.0 to 35.9 in adult 03/02/2014   Essential hypertension, benign 03/02/2014  Mitzie Freund, MD [facility]  See AMION to contact appropriate on-call provider

## 2024-08-22 NOTE — H&P (Addendum)
 History and Physical    Patient: Dean Gamblin Sr. FMW:[id number] DOB: 1950-10-09 DOA: 08/22/2024 DOS: the patient was seen and examined on 08/22/2024 PCP: [name], MD  Patient coming from: Home  Chief Complaint:  Chief Complaint  Patient presents with   Fall   HPI: Dean Ibe Sr. is a 73 y.o. male with medical history significant of CAD, PAF, ICM, cardiac arrest s/p AICD, HFrEF (EF 35-40% in 04/2024), HTN, HLD, DM2, OSA, and last admission in 06/2204 for HFrEF exacerbation who p/w back pain s/p GLF from wheelchair and found to have AKI and acute T6 fracture.  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 The fall caused him to be ejected from his wheelchair, and resulted in immediate breathlessness but the pain in the middle of the back did not begin until the next day. Of note, pt endorsed worsening coordination in BLE, as well as BLE weakness iso BLE edema over the past week, which prompted the pt to use his wheelchair intermittently. Of note, pt was evaluated by ICM Monitoring RN and noted to have worsening BLE for which his pta torsemide  was increased.  In the ED, pt tachypneic w/o hypoxia (placed on 2L [place] for unclear reasons). Labs notable for Cr 2.83 (baseline 2.3-2.5). BNP pending. CT spine showed acute oblique nondisplaced mid to anterior T6 vertebral body fracture extending through bridging enthesopathic bone without posterior element involvement, no vertebral height loss, with mild adjacent soft tissue edema and no appreciable spinal canal hematoma, as well as RML/RLL consolidation and R pleural effusion c/f empyema (no rib fx), severe T10-11/L3-5 stenosis, and liver enhancement c/f amiodarone  toxicity. EDP consulted NSGY, placed TLSO brace and requested medicine admission.   Review of Systems: As mentioned in the history of present illness. All other systems reviewed and are negative. Past  Medical History:  Diagnosis Date   AICD (automatic cardioverter/defibrillator) present 10/10/2016   a. 09/2016 s/p MDT Visia AF MRI single lead ICD (ser # [id number]).   Arthritis    maybe in his spine (09/05/2016)   Asthma    pt. denies   Cardiac arrest (HCC)    a. 08/2016 VT/VF Arrest-->09/2016 s/p MDT Visia AF MRI single lead ICD (ser # [id number]).   Chronic HFrEF (heart failure with reduced ejection fraction) (HCC)    a. 08/2016 Echo: EF 35-40%; b. 04/2018 Echo: EF 30-35%; c. 04/2020 Echo: EF 30-35%; d. 11/2021 Echo: EF 40-45%, GrI DD, nl RV fxn, sev dil LA, mild-mod MR, mild-mod AI, Ao root 38mm.   Coronary artery disease    a. 08/2016 VT Arrest/Cath: LM nl, LAD nl, LCX nl, RGCA 90p, 100d w/ L->R collats, EF 35-45%.   Gout    Hyperlipidemia LDL goal <70    Hypertension    Ischemic cardiomyopathy    a. 08/2016 Echo: EF 35-40%; b. 04/2018 Echo: EF 30-35%; c. 04/2020 Echo: EF 30-35%; d. 11/2021 Echo: EF 40-45%.   LBBB (left bundle branch block)    OSA on CPAP    does not wear CPAP   PAF (paroxysmal atrial fibrillation) (HCC)    a. CHA2DS2VASc = 5-->Eliquis .   Type II diabetes mellitus (HCC)    Past Surgical History:  Procedure Laterality Date   CARDIAC CATHETERIZATION N/A 09/07/2016   Procedure: Left Heart Cath and Coronary Angiography;  Surgeon: Peter M Jordan, MD;  Location: [facility] INVASIVE CV LAB;  Service: Cardiovascular;  Laterality: N/A;   CENTRAL LINE INSERTION  05/06/2024   Procedure: CENTRAL LINE INSERTION;  Surgeon: Cherrie Toribio SAUNDERS, MD;  Location: [facility] INVASIVE CV LAB;  Service: Cardiovascular;;   COLONOSCOPY WITH PROPOFOL  N/A 08/30/2018   Procedure: COLONOSCOPY WITH PROPOFOL ;  Surgeon: Rollin Dover, MD;  Location: [facility];  Service: Endoscopy;  Laterality: N/A;   COLONOSCOPY WITH PROPOFOL  N/A 11/16/2023   Procedure: COLONOSCOPY WITH PROPOFOL ;  Surgeon: Rollin Dover, MD;  Location: [facility];  Service: Gastroenterology;  Laterality: N/A;   COLONOSCOPY WITH PROPOFOL  N/A  11/18/2023   Procedure: COLONOSCOPY WITH PROPOFOL ;  Surgeon: Charlanne Groom, MD;  Location: [facility];  Service: Gastroenterology;  Laterality: N/A;   EP IMPLANTABLE DEVICE N/A 10/10/2016   Procedure: ICD Implant;  Surgeon: Will Gladis Norton, MD;  Location: [facility] INVASIVE CV LAB;  Service: Cardiovascular;  Laterality: N/A;   HEMOSTASIS CLIP PLACEMENT  11/16/2023   Procedure: HEMOSTASIS CLIP PLACEMENT;  Surgeon: Rollin Dover, MD;  Location: [facility];  Service: Gastroenterology;;   HEMOSTASIS CLIP PLACEMENT  11/18/2023   Procedure: HEMOSTASIS CLIP PLACEMENT;  Surgeon: Charlanne Groom, MD;  Location: [facility];  Service: Gastroenterology;;   HEMOSTASIS CONTROL  11/18/2023   Procedure: HEMOSTASIS CONTROL;  Surgeon: Charlanne Groom, MD;  Location: [facility];  Service: Gastroenterology;;  PuraStat   LUMBAR LAMINECTOMY/DECOMPRESSION MICRODISCECTOMY N/A 01/18/2018   Procedure: L2-3, L3-4, L-4-5 CENTRAL DECOMPRESSION;  Surgeon: Barbarann Oneil BROCKS, MD;  Location: [facility] OR;  Service: Orthopedics;  Laterality: N/A;   LUMBAR LAMINECTOMY/DECOMPRESSION MICRODISCECTOMY N/A 08/12/2020   Procedure: THORACIC ELEVEN LAMINECTOMY;  Surgeon: Louis Shove, MD;  Location: [facility] OR;  Service: Neurosurgery;  Laterality: N/A;   POLYPECTOMY  08/30/2018   Procedure: POLYPECTOMY;  Surgeon: Rollin Dover, MD;  Location: [facility];  Service: Endoscopy;;   POLYPECTOMY  11/16/2023   Procedure: POLYPECTOMY;  Surgeon: Rollin Dover, MD;  Location: [facility];  Service: Gastroenterology;;   RIGHT HEART CATH N/A 05/06/2024   Procedure: RIGHT HEART CATH;  Surgeon: Cherrie Toribio SAUNDERS, MD;  Location: [facility] INVASIVE CV LAB;  Service: Cardiovascular;  Laterality: N/A;   TRANSESOPHAGEAL ECHOCARDIOGRAM (CATH LAB) N/A 06/09/2024   Procedure: TRANSESOPHAGEAL ECHOCARDIOGRAM;  Surgeon: [name] Morene PARAS, MD;  Location: [facility] INVASIVE CV LAB;  Service: Cardiovascular;  Laterality: N/A;   Social History:  reports that he has never smoked. He has never used  smokeless tobacco. He reports that he does not drink alcohol and does not use drugs.  Allergies  Allergen Reactions   Contrast Media [Iodinated Contrast Media] Shortness Of Breath and Nausea And Vomiting   Lisinopril  Cough    Family History  Problem Relation Age of Onset   Heart attack Father        died in his 53's   Hypertension Sister    Cancer Brother        uncertain, leg   Hypertension Sister     Prior to Admission medications   Medication Sig Start Date End Date Taking? Authorizing Provider  furosemide  (LASIX ) 20 MG tablet Take 20 mg by mouth daily. 07/28/24  Yes [provider]  olmesartan (BENICAR) 20 MG tablet Take 20 mg by mouth daily. 08/21/24  Yes [provider]  tiZANidine (ZANAFLEX) 2 MG tablet Take 2 mg by mouth 3 (three) times daily. 08/20/24  Yes [provider]  zolpidem  (AMBIEN ) 5 MG tablet Take 5 mg by mouth at bedtime. 07/28/24  Yes [provider]  allopurinol  (ZYLOPRIM ) 300 MG tablet Take 150 mg by mouth daily. 06/25/24   [provider]  amiodarone  (PACERONE ) 200 MG tablet Take 1 tablet (  200 mg total) by mouth daily. 09/06/22   Leverne Charlies Helling, PA-C  amLODipine (NORVASC) 2.5 MG tablet Take 2.5 mg by mouth daily. 07/29/24   [provider]  apixaban  (ELIQUIS ) 5 MG TABS tablet TAKE 1 TABLET BY MOUTH TWICE A DAY 05/01/24   Camnitz, Soyla Lunger, MD  atorvastatin  (LIPITOR) 40 MG tablet Take 40 mg by mouth daily.    [provider]  cholecalciferol  (VITAMIN D3) 25 MCG (1000 UNIT) tablet Take 1,000 Units by mouth daily. 06/13/23   [provider]  ferrous sulfate  325 (65 FE) MG tablet Take 325 mg by mouth daily. 11/08/23   [provider]  furosemide  (LASIX ) 40 MG tablet Take 40 mg by mouth daily. Patient not taking: Reported on 08/22/2024 07/29/24   [provider]  hydrALAZINE  (APRESOLINE ) 50 MG tablet SMARTSIG:1.5 Tablet(s) By Mouth Every 8 Hours Patient not taking: Reported on  08/22/2024 08/09/24   [provider]  isosorbide  mononitrate (IMDUR ) 60 MG 24 hr tablet Take 0.5 tablets (30 mg total) by mouth daily. 07/19/24   Joseph, Preetha, MD  polyethylene glycol powder (GLYCOLAX /MIRALAX ) 17 GM/SCOOP powder Take 17 g by mouth daily. Patient taking differently: Take 17 g by mouth daily as needed for mild constipation. 06/25/24   Desiderio Chew, PA-C  potassium chloride  SA (KLOR-CON  M) 20 MEQ tablet Take 1 tablet (20 mEq total) by mouth daily. 06/25/24   Desiderio Chew, PA-C  torsemide  (DEMADEX ) 20 MG tablet Take 3 tablets (60 mg total) by mouth daily. 07/20/24   Fairy Frames, MD    Physical Exam: Vitals:   08/22/24 0259 08/22/24 0500 08/22/24 0605 08/22/24 0624  BP:  (!) 131/49  (!) 134/49  Pulse:  86  91  Resp:  18  (!) 25  Temp:   98.3 F (36.8 C)   TempSrc:   Oral   SpO2:  100%  100%  Weight: 83.9 kg     Height: 5' 6 (1.676 m)      General: Alert, oriented x3, resting comfortably in no acute distress Respiratory: Lungs clear to auscultation bilaterally with normal respiratory effort; no w/r/r Cardiovascular: Regular rate and rhythm w/o m/r/g Abdomen: Soft, nontender, nondistended. Positive bowel sounds MSK; TLSO brace in place   Data Reviewed:  Lab Results  Component Value Date   WBC 6.8 08/22/2024   HGB 9.0 (L) 08/22/2024   HCT 28.1 (L) 08/22/2024   MCV 92.1 08/22/2024   PLT 142 (L) 08/22/2024   Lab Results  Component Value Date   GLUCOSE 105 (H) 08/22/2024   CALCIUM  9.5 08/22/2024   NA 135 08/22/2024   K 4.1 08/22/2024   CO2 19 (L) 08/22/2024   CL 103 08/22/2024   BUN 44 (H) 08/22/2024   CREATININE 2.83 (H) 08/22/2024   Lab Results  Component Value Date   ALT 15 08/22/2024   AST 22 08/22/2024   ALKPHOS 53 08/22/2024   BILITOT 1.5 (H) 08/22/2024   Lab Results  Component Value Date   INR 1.6 (H) 08/22/2024   INR 1.8 (H) 11/17/2023   INR 1.5 (H) 08/12/2020   Radiology: CT L-SPINE NO CHARGE Result Date:  08/22/2024 EXAM: CT CHEST ABDOMEN PELVIS WITH THORACIC AND LUMBAR SPINE RECONSTRUCTIONS 08/22/2024 03:44:00 AM TECHNIQUE: CT of the chest, abdomen, pelvis was performed without the administration of intravenous contrast. Multiplanar reformatted images are provided for review, including reconstructed images of the thoracic and lumbar spine. Automated exposure control, iterative reconstruction, and/or weight based adjustment of the mA/kV was utilized to reduce  the radiation dose to as low as reasonably achievable. COMPARISON: Portable chest 07/10/2024, chest CT without contrast 07/10/2024, chest CT high resolution study 09/08/2019, abdomen and pelvis CT without contrast 05/04/2024, abdomen and pelvis CT without contrast 04/01/2024. CLINICAL HISTORY: Polytrauma, blunt. Patient fell down an unknown number of steps from his wheelchair. FINDINGS: CT CHEST: THORACIC AORTA: The aorta and great vessels are moderate to heavily calcified. No acute traumatic injury of the aorta. MEDIASTINUM: Single lead left chest pacemaker/aid, stable right ventricular wire insertion. The heart is moderately enlarged. No pericardial effusion. Left main and patchy 3-vessel coronary calcifications. Mild central pulmonary venous distention again noted with prominent pulmonary trunk measuring 3.4 cm, indicating arterial hypertension. A 1.2 cm precarinal lymph node to the right is again noted with similar sized left-sided prevascular nodes also again seen. Patulous esophagus with normal wall thickness. The trachea and main bronchi are patent. There is mild diffuse bronchial thickening. No mediastinal hematoma or pneumomediastinum. No acute traumatic injury to the heart or pericardium. LUNGS: Interval increased moderate sized right pleural effusion. Most of this does layer posteriorly, but some of it could be loculated in the chest base anteriorly. The density of the right pleural effusion is greater than simple fluid, measuring up to 33 Hounsfield  units. A component of pleural hemorrhage or proteinaceous fluid such as due to infection or empyema, is not excluded. There is no air in the pleural fluid and no pneumothorax. There is consolidation versus compressive collapse over a majority of the right middle and lower lobes. Underlying pneumonia is a consideration. Nodules previously newly identified in the base of the right upper lobe and in the right lower lobe, would be obscured by the pleural fluid if still present. Mosaic attenuation of the lungs continues to be seen, consistent with air trapping and small airway disease. The lungs are otherwise clear. No acute traumatic injury to the lungs. No pulmonary contusion or laceration. There previously was a small left pleural effusion, which has cleared. CHEST WALL: No acute displaced rib fracture. No chest wall hematoma. No chest wall mass. CT ABDOMEN AND PELVIS: ABDOMINAL AORTA: Heavy aortic and branch vessel atherosclerosis without aneurysm. No acute traumatic injury of the aorta or iliac arteries. HEPATOBILIARY: Hypoattenuating lesions in the dome of the liver, measuring 4.6 x 3.9 x 2 cm in segments 8 and 2 have been previously characterized as hemangiomas on a study of 09/19/2018. There have been no significant interval changes. The liver is dense, which could be seen with amiodarone  or heavy metal toxicity as well as hemochromatosis. There is cholelithiasis without gallbladder wall thickening or biliary dilatation. No acute liver abnormality is seen without contrast. SPLEEN: No acute traumatic injury. PANCREAS: No acute traumatic injury. ADRENAL GLANDS: No adrenal hemorrhage is seen. No acute traumatic injury. KIDNEYS: No renal or perinephric hemorrhage is seen. There is chronic perinephric stranding. No urinary stone or obstruction. No contour deforming mass of the unenhanced kidneys is seen. No acute traumatic injury. No hydronephrosis. GI TRACT: There is no bowel obstruction or inflammation. The gastric  wall is unremarkable. The appendix is not seen. There is advanced sigmoid diverticulosis without diverticulitis. Small fat-containing umbilical hernia. No acute traumatic injury of the bowel. PERITONEUM: No ascites or free air. No free fluid or free hemorrhage. RETROPERITONEUM: No retroperitoneal hematoma. BLADDER: The bladder is thickened but also not fully distended. Correlate clinically for cystitis. REPRODUCTIVE ORGANS: There are dystrophic calcifications within a mildly enlarged prostate gland. BONES: No acute traumatic fracture of the pelvis. Advanced symmetric arthrosis  at the hips, ankylosis at the SI joints. No acute regional skeletal fracture at the levels of the abdomen and pelvis. SOFT TISSUES: There is increased skin thickening and subcutaneous stranding over the low anterior abdominal wall, which could be congestive or due to cellulitis. Mild anasarca is seen in portions of the remaining body wall. THORACIC AND LUMBAR SPINE: BONES AND ALIGNMENT: *   **Thoracic Spine:** * Acute oblique nondisplaced fracture through the mid to anterior T6 vertebral body extending through bridging enthesopathic bone, without evidence of fracture transmission into the posterior elements and pedicles. There is no loss of vertebral height at T6. * Broad-based mid to lower thoracic kyphosis centered at T10-T11, where there is again noted bulky bridging bone anteriorly between the segments, moderate wedging of both segments, with interbody ankylosis consistent with old injury, with old spinous process removal at both levels with dorsal bone graft fusion. . * The rest of the thoracic spine also has extensive bridging enthesopathy beginning at T2, resulting in multilevel interbody fusions. This is eccentric to the right anterolateral aspect. There is ankylosis over multiple spinous processes. Facet joints are also fused at multiple levels with facet hypertrophy. * Above and below T10 and T11 the thoracic vertebrae are normal in  height. There is osteopenia. *   **Lumbar Spine:** *   No acute fracture is evident. * Chronic grade 1 degenerative anterolisthesis at L4-L5 without other abnormal alignment. * Advanced lumbar marginal osteophytosis with bulky bridging osteophytes T12-L1, L1-L2, L2-L3, and L4-L5 with attempted bridging osteophytes at L3-L4 and L5-S1. * The discs are of normal height apart from chronic disc space widening at L3-L4. Thank you *   Advanced facet hypertrophy most levels. * The spinous processes were previously removed at L2, L3, and L4 with spinous process shaving at L5. DEGENERATIVE CHANGES: * **Thoracic Spine:** Varying degrees of multilevel acquired foraminal stenosis, but this is most severe on the left at T1-T2, on the right at T4-T5, on the right greater than left at T10-T11, on the right at T11-T12. Posterior endplate ridging at the level of T10-T11 does flatten the ventral thecal sac and cause 7 mm thecal sac AP stenosis and probable mild flattening of the ventral cord without frank impression. Other levels do not show noteworthy soft tissue or bony encroachment on the thecal sac. *   **Lumbar Spine:** Acquired spinal stenosis is noted all levels except L1-L2, due to ligamentous facet hypertrophy and posterior endplate ridging, but it is most severe at L3-L4 and L4-L5. Foraminal stenosis is moderate to severe bilaterally from L2-L3 through L4-L5. SOFT TISSUES: * **Thoracic Spine:** Mild edema in the adjacent soft tissues at T6, but there is no appreciable spinal canal hematoma. No paraspinal mass or hematoma. *   **Lumbar Spine:** No paraspinal mass or hematoma. IMPRESSION: 1. Acute oblique nondisplaced mid to anterior T6 vertebral body fracture extending through bridging enthesopathic bone without posterior element involvement, no vertebral height loss, with mild adjacent soft tissue edema and no appreciable spinal canal hematoma. 2. Interval increased moderate-sized right pleural effusion demonstrating  increased attenuation suggesting hemorrhagic or proteinaceous content; no pneumothorax. The rib cage is intact . 3. Right middle and lower lobe consolidation versus compressive atelectasis, with pneumonia and secondary empyema a consideration. 4. 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 5. Severe lumbar spinal stenosis at L3-L4 and L4-L5 with moderate  to severe bilateral foraminal stenosis from L2-3 through L4-5. 6. Enlarged prostate. 7. Dense liver suggesting metallic or amiodarone  toxicity. 8. Cholelithiasis. 9. Cardiomegaly with aortic and coronary atherosclerosis. 10. Body wall edema, with increased skin thickening and stranding in the lower end of the ear canal which could represent cerumen or mastoid cellulitis. 11. Advanced sigmoid diverticulosis and additional detailed findings discussed above. Electronically signed by: Francis Quam MD 08/22/2024 05:00 AM EST RP Workstation: [id number]   CT T-SPINE NO CHARGE Result Date: 08/22/2024 EXAM: CT CHEST ABDOMEN PELVIS WITH THORACIC AND LUMBAR SPINE RECONSTRUCTIONS 08/22/2024 03:44:00 AM TECHNIQUE: CT of the chest, abdomen, pelvis was performed without the administration of intravenous contrast. Multiplanar reformatted images are provided for review, including reconstructed images of the thoracic and lumbar spine. Automated exposure control, iterative reconstruction, and/or weight based adjustment of the mA/kV was utilized to reduce the radiation dose to as low as reasonably achievable. COMPARISON: Portable chest 07/10/2024, chest CT without contrast 07/10/2024, chest CT high resolution study 09/08/2019, abdomen and pelvis CT without contrast 05/04/2024, abdomen and pelvis CT without contrast 04/01/2024. CLINICAL HISTORY: Polytrauma, blunt. Patient  fell down an unknown number of steps from his wheelchair. FINDINGS: CT CHEST: THORACIC AORTA: The aorta and great vessels are moderate to heavily calcified. No acute traumatic injury of the aorta. MEDIASTINUM: Single lead left chest pacemaker/aid, stable right ventricular wire insertion. The heart is moderately enlarged. No pericardial effusion. Left main and patchy 3-vessel coronary calcifications. Mild central pulmonary venous distention again noted with prominent pulmonary trunk measuring 3.4 cm, indicating arterial hypertension. A 1.2 cm precarinal lymph node to the right is again noted with similar sized left-sided prevascular nodes also again seen. Patulous esophagus with normal wall thickness. The trachea and main bronchi are patent. There is mild diffuse bronchial thickening. No mediastinal hematoma or pneumomediastinum. No acute traumatic injury to the heart or pericardium. LUNGS: Interval increased moderate sized right pleural effusion. Most of this does layer posteriorly, but some of it could be loculated in the chest base anteriorly. The density of the right pleural effusion is greater than simple fluid, measuring up to 33 Hounsfield units. A component of pleural hemorrhage or proteinaceous fluid such as due to infection or empyema, is not excluded. There is no air in the pleural fluid and no pneumothorax. There is consolidation versus compressive collapse over a majority of the right middle and lower lobes. Underlying pneumonia is a consideration. Nodules previously newly identified in the base of the right upper lobe and in the right lower lobe, would be obscured by the pleural fluid if still present. Mosaic attenuation of the lungs continues to be seen, consistent with air trapping and small airway disease. The lungs are otherwise clear. No acute traumatic injury to the lungs. No pulmonary contusion or laceration. There previously was a small left pleural effusion, which has cleared. CHEST WALL: No  acute displaced rib fracture. No chest wall hematoma. No chest wall mass. CT ABDOMEN AND PELVIS: ABDOMINAL AORTA: Heavy aortic and branch vessel atherosclerosis without aneurysm. No acute traumatic injury of the aorta or iliac arteries. HEPATOBILIARY: Hypoattenuating lesions in the dome of the liver, measuring 4.6 x 3.9 x 2 cm in segments 8 and 2 have been previously characterized as hemangiomas on a study of 09/19/2018. There have been no significant interval changes. The liver is dense, which could be seen with amiodarone  or heavy metal toxicity as well as hemochromatosis. There is cholelithiasis without gallbladder wall thickening or biliary dilatation. No acute liver abnormality is seen  without contrast. SPLEEN: No acute traumatic injury. PANCREAS: No acute traumatic injury. ADRENAL GLANDS: No adrenal hemorrhage is seen. No acute traumatic injury. KIDNEYS: No renal or perinephric hemorrhage is seen. There is chronic perinephric stranding. No urinary stone or obstruction. No contour deforming mass of the unenhanced kidneys is seen. No acute traumatic injury. No hydronephrosis. GI TRACT: There is no bowel obstruction or inflammation. The gastric wall is unremarkable. The appendix is not seen. There is advanced sigmoid diverticulosis without diverticulitis. Small fat-containing umbilical hernia. No acute traumatic injury of the bowel. PERITONEUM: No ascites or free air. No free fluid or free hemorrhage. RETROPERITONEUM: No retroperitoneal hematoma. BLADDER: The bladder is thickened but also not fully distended. Correlate clinically for cystitis. REPRODUCTIVE ORGANS: There are dystrophic calcifications within a mildly enlarged prostate gland. BONES: No acute traumatic fracture of the pelvis. 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 No acute regional skeletal fracture at the levels of the abdomen and pelvis. SOFT TISSUES: There is increased skin thickening and subcutaneous stranding over  the low anterior abdominal wall, which could be congestive or due to cellulitis. Mild anasarca is seen in portions of the remaining body wall. THORACIC AND LUMBAR SPINE: BONES AND ALIGNMENT: *   **Thoracic Spine:** * Acute oblique nondisplaced fracture through the mid to anterior T6 vertebral body extending through bridging enthesopathic bone, without evidence of fracture transmission into the posterior elements and pedicles. There is no loss of vertebral height at T6. * Broad-based mid to lower thoracic kyphosis centered at T10-T11, where there is again noted bulky bridging bone anteriorly between the segments, moderate wedging of both segments, with interbody ankylosis consistent with old injury, with old spinous process removal at both levels with dorsal bone graft fusion. . * The rest of the thoracic spine also has extensive bridging enthesopathy beginning at T2, resulting in multilevel interbody fusions. This is eccentric to the right anterolateral aspect. There is ankylosis over multiple spinous processes. Facet joints are also fused at multiple levels with facet hypertrophy. * Above and below T10 and T11 the thoracic vertebrae are normal in height. There is osteopenia. *   **Lumbar Spine:** *   No acute fracture is evident. * Chronic grade 1 degenerative anterolisthesis at L4-L5 without other abnormal alignment. * Advanced lumbar marginal osteophytosis with bulky bridging osteophytes T12-L1, L1-L2, L2-L3, and L4-L5 with attempted bridging osteophytes at L3-L4 and L5-S1. * The discs are of normal height apart from chronic disc space widening at L3-L4. Thank you *   Advanced facet hypertrophy most levels. * The spinous processes were previously removed at L2, L3, and L4 with spinous process shaving at L5. DEGENERATIVE CHANGES: * **Thoracic Spine:** Varying degrees of multilevel acquired foraminal stenosis, but this is most severe on the left at T1-T2, on the right at T4-T5, on the right greater than left at  T10-T11, on the right at T11-T12. Posterior endplate ridging at the level of T10-T11 does flatten the ventral thecal sac and cause 7 mm thecal sac AP stenosis and probable mild flattening of the ventral cord without frank impression. Other levels do not show noteworthy soft tissue or bony encroachment on the thecal sac. *   **Lumbar Spine:** Acquired spinal stenosis is noted all levels except L1-L2, due to ligamentous facet hypertrophy and posterior endplate ridging, but it is most severe at L3-L4 and L4-L5. Foraminal stenosis is moderate to severe bilaterally from L2-L3 through L4-L5. SOFT TISSUES: * **Thoracic Spine:** Mild edema in the adjacent soft tissues at  T6, but there is no appreciable spinal canal hematoma. No paraspinal mass or hematoma. *   **Lumbar Spine:** No paraspinal mass or hematoma. IMPRESSION: 1. Acute oblique nondisplaced mid to anterior T6 vertebral body fracture extending through bridging enthesopathic bone without posterior element involvement, no vertebral height loss, with mild adjacent soft tissue edema and no appreciable spinal canal hematoma. 2. Interval increased moderate-sized right pleural effusion demonstrating increased attenuation suggesting hemorrhagic or proteinaceous content; no pneumothorax. The rib cage is intact . 3. Right middle and lower lobe consolidation versus compressive atelectasis, with pneumonia and secondary empyema a consideration. 4. 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 5. Severe lumbar spinal stenosis at L3-L4 and L4-L5 with moderate to severe bilateral foraminal stenosis from L2-3 through L4-5. 6. Enlarged prostate. 7. Dense liver suggesting metallic or amiodarone  toxicity. 8. Cholelithiasis. 9. Cardiomegaly with aortic and coronary  atherosclerosis. 10. Body wall edema, with increased skin thickening and stranding in the lower end of the ear canal which could represent cerumen or mastoid cellulitis. 11. Advanced sigmoid diverticulosis and additional detailed findings discussed above. Electronically signed by: Francis Quam MD 08/22/2024 05:00 AM EST RP Workstation: [id number]   CT CHEST ABDOMEN PELVIS WO CONTRAST Result Date: 08/22/2024 EXAM: CT CHEST ABDOMEN PELVIS WITH THORACIC AND LUMBAR SPINE RECONSTRUCTIONS 08/22/2024 03:44:00 AM TECHNIQUE: CT of the chest, abdomen, pelvis was performed without the administration of intravenous contrast. Multiplanar reformatted images are provided for review, including reconstructed images of the thoracic and lumbar spine. Automated exposure control, iterative reconstruction, and/or weight based adjustment of the mA/kV was utilized to reduce the radiation dose to as low as reasonably achievable. COMPARISON: Portable chest 07/10/2024, chest CT without contrast 07/10/2024, chest CT high resolution study 09/08/2019, abdomen and pelvis CT without contrast 05/04/2024, abdomen and pelvis CT without contrast 04/01/2024. CLINICAL HISTORY: Polytrauma, blunt. Patient fell down an unknown number of steps from his wheelchair. FINDINGS: CT CHEST: THORACIC AORTA: The aorta and great vessels are moderate to heavily calcified. No acute traumatic injury of the aorta. MEDIASTINUM: Single lead left chest pacemaker/aid, stable right ventricular wire insertion. The heart is moderately enlarged. No pericardial effusion. Left main and patchy 3-vessel coronary calcifications. Mild central pulmonary venous distention again noted with prominent pulmonary trunk measuring 3.4 cm, indicating arterial hypertension. A 1.2 cm precarinal lymph node to the right is again noted with similar sized left-sided prevascular nodes also again seen. Patulous esophagus with normal wall thickness. The trachea and main bronchi are patent. There is  mild diffuse bronchial thickening. No mediastinal hematoma or pneumomediastinum. No acute traumatic injury to the heart or pericardium. LUNGS: Interval increased moderate sized right pleural effusion. Most of this does layer posteriorly, but some of it could be loculated in the chest base anteriorly. The density of the right pleural effusion is greater than simple fluid, measuring up to 33 Hounsfield units. A component of pleural hemorrhage or proteinaceous fluid such as due to infection or empyema, is not excluded. There is no air in the pleural fluid and no pneumothorax. There is consolidation versus compressive collapse over a majority of the right middle and lower lobes. Underlying pneumonia is a consideration. Nodules previously newly identified in the base of the right upper lobe and in the right lower lobe, would be obscured by the pleural fluid if still present. Mosaic attenuation of the lungs continues to be  seen, consistent with air trapping and small airway disease. The lungs are otherwise clear. No acute traumatic injury to the lungs. No pulmonary contusion or laceration. There previously was a small left pleural effusion, which has cleared. CHEST WALL: No acute displaced rib fracture. No chest wall hematoma. No chest wall mass. CT ABDOMEN AND PELVIS: ABDOMINAL AORTA: Heavy aortic and branch vessel atherosclerosis without aneurysm. No acute traumatic injury of the aorta or iliac arteries. HEPATOBILIARY: Hypoattenuating lesions in the dome of the liver, measuring 4.6 x 3.9 x 2 cm in segments 8 and 2 have been previously characterized as hemangiomas on a study of 09/19/2018. There have been no significant interval changes. The liver is dense, which could be seen with amiodarone  or heavy metal toxicity as well as hemochromatosis. There is cholelithiasis without gallbladder wall thickening or biliary dilatation. No acute liver abnormality is seen without contrast. SPLEEN: No acute traumatic injury. PANCREAS:  No acute traumatic injury. ADRENAL GLANDS: No adrenal hemorrhage is seen. No acute traumatic injury. KIDNEYS: No renal or perinephric hemorrhage is seen. There is chronic perinephric stranding. No urinary stone or obstruction. No contour deforming mass of the unenhanced kidneys is seen. No acute traumatic injury. No hydronephrosis. GI TRACT: There is no bowel obstruction or inflammation. The gastric wall is unremarkable. The appendix is not seen. There is advanced sigmoid diverticulosis without diverticulitis. Small fat-containing umbilical hernia. No acute traumatic injury of the bowel. PERITONEUM: No ascites or free air. No free fluid or free hemorrhage. RETROPERITONEUM: No retroperitoneal hematoma. BLADDER: The bladder is thickened but also not fully distended. Correlate clinically for cystitis. REPRODUCTIVE ORGANS: There are dystrophic calcifications within a mildly enlarged prostate gland. BONES: No acute traumatic fracture of the pelvis. 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 No acute regional skeletal fracture at the levels of the abdomen and pelvis. SOFT TISSUES: There is increased skin thickening and subcutaneous stranding over the low anterior abdominal wall, which could be congestive or due to cellulitis. Mild anasarca is seen in portions of the remaining body wall. THORACIC AND LUMBAR SPINE: BONES AND ALIGNMENT: *   **Thoracic Spine:** * Acute oblique nondisplaced fracture through the mid to anterior T6 vertebral body extending through bridging enthesopathic bone, without evidence of fracture transmission into the posterior elements and pedicles. There is no loss of vertebral height at T6. * Broad-based mid to lower thoracic kyphosis centered at T10-T11, where there is again noted bulky bridging bone anteriorly between the segments, moderate wedging of both segments, with interbody ankylosis consistent with old injury, with old spinous process removal at both levels with  dorsal bone graft fusion. . * The rest of the thoracic spine also has extensive bridging enthesopathy beginning at T2, resulting in multilevel interbody fusions. This is eccentric to the right anterolateral aspect. There is ankylosis over multiple spinous processes. Facet joints are also fused at multiple levels with facet hypertrophy. * Above and below T10 and T11 the thoracic vertebrae are normal in height. There is osteopenia. *   **Lumbar Spine:** *   No acute fracture is evident. * Chronic grade 1 degenerative anterolisthesis at L4-L5 without other abnormal alignment. * Advanced lumbar marginal osteophytosis with bulky bridging osteophytes T12-L1, L1-L2, L2-L3, and L4-L5 with attempted bridging osteophytes at L3-L4 and L5-S1. * The discs are of normal height apart from chronic disc space widening at L3-L4. Thank you *   Advanced facet hypertrophy most levels. * The spinous processes were previously removed at L2, L3, and L4 with  spinous process shaving at L5. DEGENERATIVE CHANGES: * **Thoracic Spine:** Varying degrees of multilevel acquired foraminal stenosis, but this is most severe on the left at T1-T2, on the right at T4-T5, on the right greater than left at T10-T11, on the right at T11-T12. Posterior endplate ridging at the level of T10-T11 does flatten the ventral thecal sac and cause 7 mm thecal sac AP stenosis and probable mild flattening of the ventral cord without frank impression. Other levels do not show noteworthy soft tissue or bony encroachment on the thecal sac. *   **Lumbar Spine:** Acquired spinal stenosis is noted all levels except L1-L2, due to ligamentous facet hypertrophy and posterior endplate ridging, but it is most severe at L3-L4 and L4-L5. Foraminal stenosis is moderate to severe bilaterally from L2-L3 through L4-L5. SOFT TISSUES: * **Thoracic Spine:** Mild edema in the adjacent soft tissues at T6, but there is no appreciable spinal canal hematoma. No paraspinal mass or hematoma. *    **Lumbar Spine:** No paraspinal mass or hematoma. IMPRESSION: 1. Acute oblique nondisplaced mid to anterior T6 vertebral body fracture extending through bridging enthesopathic bone without posterior element involvement, no vertebral height loss, with mild adjacent soft tissue edema and no appreciable spinal canal hematoma. 2. Interval increased moderate-sized right pleural effusion demonstrating increased attenuation suggesting hemorrhagic or proteinaceous content; no pneumothorax. The rib cage is intact . 3. Right middle and lower lobe consolidation versus compressive atelectasis, with pneumonia and secondary empyema a consideration. 4. 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 5. Severe lumbar spinal stenosis at L3-L4 and L4-L5 with moderate to severe bilateral foraminal stenosis from L2-3 through L4-5. 6. Enlarged prostate. 7. Dense liver suggesting metallic or amiodarone  toxicity. 8. Cholelithiasis. 9. Cardiomegaly with aortic and coronary atherosclerosis. 10. Body wall edema, with increased skin thickening and stranding in the lower end of the ear canal which could represent cerumen or mastoid cellulitis. 11. Advanced sigmoid diverticulosis and additional detailed findings discussed above. Electronically signed by: Francis Quam MD 08/22/2024 05:00 AM EST RP Workstation: [id number]    Assessment and Plan: 51M h/o CAD, PAF, ICM, cardiac arrest s/p AICD, HFrEF (EF 35-40% in 04/2024), HTN, HLD, DM2, OSA, and last admission in 06/2204 for HFrEF exacerbation who p/w back pain s/p GLF from wheelchair and found to have AKI and acute T6 fracture.  Acute T6 fracture -NSGY consulted; apprec eval/recs (no surgery indicated; TLSO and OP f/u w/ Dr. [name]) -TLSO brace for now  GLF Physical  deconditioning -PT/OT consulted; apprec eval/recs  R pleural effusion RML/RLL consolidation Presumed HFrEF exacerbation High suspicion for HFrEF exacerbation given AKI and elevated BNP -IR consulted for thoracentesis per surgery -PO diuresis per below for now -Defer abx for now -F/u Chest CT WO -F/u procalcitonin  Liver enhancement on imaging -OP f/u with Cards/GI to discuss amiodarone  toxicity  PAF -PTA amiodarone  200mg  daily and Eliquis  5mg  BID  HFrEF -PTA atorvastatin  40mg  daily, irbesartan  150mg  daily, and torsemide  60mg  daily (NOTE: recent OP Cards tele appointment note on 11/20 suggested multiple missed appointments and recent increase in BLE and uptitration of torsemide  to 60mg  daily)   Advance Care Planning:   Code Status: Limited: Do not attempt resuscitation (DNR) -DNR-LIMITED -Do Not Intubate/DNI    Consults: NSGY  Family Communication: Daughter  Severity of Illness: The appropriate patient status for this patient is INPATIENT. Inpatient status is  judged to be reasonable and necessary in order to provide the required intensity of service to ensure the patient's safety. The patient's presenting symptoms, physical exam findings, and initial radiographic and laboratory data in the context of their chronic comorbidities is felt to place them at high risk for further clinical deterioration. Furthermore, it is not anticipated that the patient will be medically stable for discharge from the hospital within 2 midnights of admission.   * I certify that at the point of admission it is my clinical judgment that the patient will require inpatient hospital care spanning beyond 2 midnights from the point of admission due to high intensity of service, high risk for further deterioration and high frequency of surveillance required.*   ------- I spent 55 minutes reviewing previous notes, at the bedside counseling/discussing the treatment plan, and performing clinical  documentation.  Author: Marsha Ada, MD 08/22/2024 8:38 AM  For on call review [email or web address].

## 2024-08-22 NOTE — Plan of Care (Signed)
 Pt admitted for fall from home. Right lower limb scab from prior event. Pt A&OX4, Brace brought bedside by Ortho. Pt. BP (!) 134/49   Pulse 91   Temp 98.3 F (36.8 C) (Oral)   Resp (!) 25   Ht 5' 6 (1.676 m)   Wt 83.9 kg   SpO2 100%   BMI 29.86 kg/m  Pt ordered CT, pt requesting pain medicine for back pain 6 of 10 with tylenol  ordered by attending.Pt oriented to floor. Tele NSR with no other needs voiced at this time. Dean [name]. 08/22/24 11:14 AM

## 2024-08-22 NOTE — Progress Notes (Signed)
 Orthopedic Tech Progress Note Patient Details:  Dean Bugaj Sr. [birth year] [id number] RN called for in house TLSO for patient admittance. She stated we can bring it up to the room he is assigned to later.  Patient ID: Dean Blyden Sr., male   DOB: 05-Jan-1951, 73 y.o.   MRN: [id number]  Dean [name] 08/22/2024, 6:08 AM

## 2024-08-22 NOTE — Progress Notes (Signed)
 Occupational Therapy Evaluation Patient Details Name: Dean Moates Sr. MRN: [id number] DOB: Nov 11, 1950 Today's Date: 08/22/2024   History of Present Illness   73 yo male s/p fall down 8 stairs in wheelchair. CT (+) T6 vertebral body fx, neurosurgery states no plan for surgery but needs TLSO brace. S/p R thoracentesis 11/28 yielding 500 mL bloody fluid. PMH AICD, arthritis, HLD, HFrEF, ICD, PAF on Eliquis , HTN, CAD, HLD, OSA on cpap, T2DM, CKD, HTN, laminectomy, cardiac arrest (2017).     Clinical Impressions [place] was evaluated s/p the above admission list. He is mod I with SPC or WC at baseline. Upon evaluation the pt was limited by generalized weakness, back pain, spinal precautions and decreased activity tolerance. Overall he needed mod A +2 for bed mobility and min A fro tranfers and basic mobility with RW. Due to the deficits listed below the pt also needs up to max A for LB ADLs and min A for UB ADLs. Pts DBP was low at the end of the session, RN notified. Pt will benefit from continued acute OT services and HHOT.      If plan is discharge home, recommend the following:   A little help with walking and/or transfers;A lot of help with bathing/dressing/bathroom;Assistance with cooking/housework;Direct supervision/assist for medications management;Assist for transportation;Help with stairs or ramp for entrance     Functional Status Assessment   Patient has had a recent decline in their functional status and demonstrates the ability to make significant improvements in function in a reasonable and predictable amount of time.     Equipment Recommendations   Other (comment) (RW)     Precautions/Restrictions   Precautions Precautions: Fall;Back Recall of Precautions/Restrictions: Intact Required Braces or Orthoses: Spinal Brace Spinal Brace: Thoracolumbosacral orthotic Restrictions Weight Bearing Restrictions Per Provider Order: No     Mobility Bed Mobility Overal  bed mobility: Needs Assistance Bed Mobility: Sidelying to Sit, Rolling Rolling: Min assist, Used rails Sidelying to sit: Mod assist, +2 for physical assistance, HOB elevated, Used rails       General bed mobility comments: assist for completion of roll L/R, trunk elevation and LE progression over EOB    Transfers Overall transfer level: Needs assistance Equipment used: Rolling walker (2 wheels) Transfers: Sit to/from Stand Sit to Stand: Min assist, +2 safety/equipment           General transfer comment: light rise assist, pt with good power up once initiated. Cues for hand placement when rising/sitting      Balance Overall balance assessment: Needs assistance Sitting-balance support: No upper extremity supported, Feet supported Sitting balance-Leahy Scale: Fair     Standing balance support: Bilateral upper extremity supported Standing balance-Leahy Scale: Fair                             ADL either performed or assessed with clinical judgement   ADL Overall ADL's : Needs assistance/impaired Eating/Feeding: Independent   Grooming: Set up;Sitting   Upper Body Bathing: Set up;Sitting   Lower Body Bathing: Maximal assistance   Upper Body Dressing : Set up;Sitting Upper Body Dressing Details (indicate cue type and reason): total A for TLSO Lower Body Dressing: Maximal assistance;Sit to/from stand   Toilet Transfer: Minimal assistance;Rolling walker (2 wheels)   Toileting- Clothing Manipulation and Hygiene: Maximal assistance;Sitting/lateral lean;Sit to/from stand       Functional mobility during ADLs: Minimal assistance General ADL Comments: limited by back pain, precautions and activity tolerance  Vision Baseline Vision/History: 0 No visual deficits Vision Assessment?: No apparent visual deficits     Perception Perception: Not tested       Praxis Praxis: Not tested       Pertinent Vitals/Pain Pain Assessment Pain Assessment:  Faces Faces Pain Scale: Hurts little more Pain Location: R side back Pain Descriptors / Indicators: Grimacing, Guarding, Discomfort Pain Intervention(s): Limited activity within patient's tolerance, Monitored during session     Extremity/Trunk Assessment Upper Extremity Assessment Upper Extremity Assessment: Generalized weakness   Lower Extremity Assessment Lower Extremity Assessment: Defer to PT evaluation   Cervical / Trunk Assessment Cervical / Trunk Assessment: Other exceptions Cervical / Trunk Exceptions: back fx, forward head/rounded shoulders presentation   Communication Communication Communication: No apparent difficulties   Cognition Arousal: Alert Behavior During Therapy: WFL for tasks assessed/performed Cognition: No apparent impairments                               Following commands: Impaired Following commands impaired: Follows one step commands with increased time     Cueing  General Comments   Cueing Techniques: Verbal cues  on 4LO2 upon arrival to room, SpO2 98-99% on 2L, so left on 2LO2 and notified RN. BP soft 108/35 in chair with legs elevated, RN called to room to assess and pt asymptomatic.    Home Living Family/patient expects to be discharged to:: Private residence Living Arrangements: Children Available Help at Discharge: Family;Available 24 hours/day Type of Home: House Home Access: Level entry     Home Layout: Two level;Bed/bath upstairs Alternate Level Stairs-Number of Steps: 13 Alternate Level Stairs-Rails: Right;Left;Can reach both Bathroom Shower/Tub: Producer, Television/film/video: Standard Bathroom Accessibility: Yes   Home Equipment: Control And Instrumentation Engineer (2 wheels);Grab bars - tub/shower          Prior Functioning/Environment Prior Level of Function : Independent/Modified Independent;Driving;Working/employed             Mobility Comments: SPC all of the time, uses a WC for longer  distances and energuce conervation ADLs Comments: independent in ADLs, drives, construction part time    OT Problem List: Decreased strength;Decreased range of motion;Decreased activity tolerance;Impaired balance (sitting and/or standing);Decreased safety awareness;Decreased knowledge of use of DME or AE;Decreased knowledge of precautions;Pain   OT Treatment/Interventions: Self-care/ADL training;Therapeutic exercise;DME and/or AE instruction;Therapeutic activities;Patient/family education;Balance training      OT Goals(Current goals can be found in the care plan section)   Acute Rehab OT Goals Patient Stated Goal: home OT Goal Formulation: With patient Time For Goal Achievement: 09/05/24 Potential to Achieve Goals: Good ADL Goals Pt Will Perform Lower Body Dressing: with modified independence;sit to/from stand Pt Will Transfer to Toilet: with modified independence;ambulating Additional ADL Goal #1: Pt will indep follow spinal precautions during ADLs   OT Frequency:  Min 2X/week    Co-evaluation PT/OT/SLP Co-Evaluation/Treatment: Yes Reason for Co-Treatment: Complexity of the patient's impairments (multi-system involvement);For patient/therapist safety;To address functional/ADL transfers   OT goals addressed during session: ADL's and self-care      AM-PAC OT 6 Clicks Daily Activity     Outcome Measure Help from another person eating meals?: None Help from another person taking care of personal grooming?: A Little Help from another person toileting, which includes using toliet, bedpan, or urinal?: A Lot Help from another person bathing (including washing, rinsing, drying)?: A Lot Help from another person to put on and taking off regular upper body clothing?: A Little  Help from another person to put on and taking off regular lower body clothing?: A Lot 6 Click Score: 16   End of Session Equipment Utilized During Treatment: Back brace;Rolling walker (2 wheels) Nurse  Communication: Mobility status  Activity Tolerance: Patient tolerated treatment well Patient left: in chair;with call bell/phone within reach;with chair alarm set;with family/visitor present  OT Visit Diagnosis: Unsteadiness on feet (R26.81);Other abnormalities of gait and mobility (R26.89);Muscle weakness (generalized) (M62.81);History of falling (Z91.81);Pain                Time: 8679-8647 OT Time Calculation (min): 32 min Charges:  OT General Charges $OT Visit: 1 Visit OT Evaluation $OT Eval Moderate Complexity: 1 Mod  [name], OTR/L Acute Rehabilitation Services Office [phone] Secure Chat Communication Preferred   [name] 08/22/2024, 2:28 PM

## 2024-08-22 NOTE — ED Provider Notes (Signed)
 [place] EMERGENC[facility] DEPARTMENT AT [facility] Provider Note   CSN: [id number] Arrival date & time: 08/22/24  0254     Patient presents with: Dean [name] is a 73 y.o. male.   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  States that he has chronic lower extremity weakness secondary to an accident several years ago.  He is able to transition and ambulate with a cane, but uses a wheelchair when his symptoms of weakness feel exacerbated.  [place] using his [place] 2 days ago [place] local church [place] he misjudged the location of the ramp in proximity to the stairs.  States that he fell in his wheelchair down 7 or 8 steps.  Denies hitting his head or losing consciousness, but has had pain to the right side of his chest as well as back pain since the fall.  Called his primary care doctor who sent a prescription for a muscle relaxer to his pharmacy.  This has provided little relief.  Denies any headache, new or worsening numbness or tingling, nausea, vomiting.  Last dose of Eliquis  this AM, prior to ED arrival.  Resides in a home with his daughters.  The history is provided by the patient and the EMS personnel. No language interpreter was used.  Fall       Prior to Admission medications   Medication Sig Start Date End Date Taking? Authorizing Provider  allopurinol  (ZYLOPRIM ) 300 MG tablet Take 150 mg by mouth daily. 06/25/24   [provider]  amiodarone  (PACERONE ) 200 MG tablet Take 1 tablet (200 mg total) by mouth daily. 09/06/22   Ursuy, Renee Lynn, PA-C  apixaban  (ELIQUIS ) 5 MG TABS tablet TAKE 1 TABLET BY MOUTH TWICE A DAY 05/01/24   Camnitz, Soyla Lunger, MD  atorvastatin  (LIPITOR) 40 MG tablet Take 40 mg by mouth daily.    [provider]  cholecalciferol  (VITAMIN D3) 25 MCG (1000 UNIT) tablet Take 1,000 Units by  mouth daily. 06/13/23   [provider]  ferrous sulfate  325 (65 FE) MG tablet Take 325 mg by mouth daily. 11/08/23   [provider]  hydrALAZINE  (APRESOLINE ) 10 MG tablet Take 1 tablet (10 mg total) by mouth 3 (three) times daily. 07/19/24   Fairy Frames, MD  isosorbide  mononitrate (IMDUR ) 60 MG 24 hr tablet Take 0.5 tablets (30 mg total) by mouth daily. 07/19/24   Joseph, Preetha, MD  polyethylene glycol powder (GLYCOLAX /MIRALAX ) 17 GM/SCOOP powder Take 17 g by mouth daily. Patient taking differently: Take 17 g by mouth daily as needed for mild constipation. 06/25/24   Desiderio Chew, PA-C  potassium chloride  SA (KLOR-CON  M) 20 MEQ tablet Take 1 tablet (20 mEq total) by mouth daily. 06/25/24   Geiple, Joshua, PA-C  torsemide  (DEMADEX ) 20 MG tablet Take 3 tablets (60 mg total) by mouth daily. 07/20/24   Fairy Frames, MD    Allergies: Contrast media [iodinated contrast media] and Lisinopril     Review of Systems Ten systems reviewed and are negative for acute change, except as noted in the HPI.    Updated Vital Signs BP (!) 131/49   Pulse 86   Temp 97.8 F (36.6 C) (Oral)   Resp 18   Ht 5' 6 (1.676 m)   Wt 83.9 kg   SpO2 100%   BMI 29.86 kg/m  Physical Exam Vitals and nursing note reviewed.  Constitutional:      General: He is not in acute distress.    Appearance: He is well-developed. He is not diaphoretic.     Comments: Alert, pleasant AA male. Smells of urine.  HENT:     Head: Normocephalic and atraumatic.     Comments: No hematoma or contusion to scalp.  No battle signs or raccoon's eyes. Eyes:     General: No scleral icterus.    Conjunctiva/sclera: Conjunctivae normal.  Cardiovascular:     Rate and Rhythm: Normal rate and regular rhythm.     Pulses: Normal pulses.  Pulmonary:     Effort: Pulmonary effort is normal. No respiratory distress.     Comments: Patient appears dyspneic, but there is no tachypnea.  Chest expansion symmetric.  There is  some discomfort on palpation of the right lower, lateral chest wall without crepitus.  No deformity. Musculoskeletal:        General: Normal range of motion.     Cervical back: Normal range of motion.     Comments: Tenderness to palpation of the lower thoracic and upper lumbar vertebral midline.  No crepitus noted.  Skin:    General: Skin is warm and dry.     Coloration: Skin is not pale.     Findings: No erythema or rash.     Comments: No hematoma or contusion to chest or flank  Neurological:     Mental Status: He is alert and oriented to person, place, and time.     Comments: Moving extremities symmetrically.  Able to lift both legs against gravity.  Psychiatric:        Behavior: Behavior normal.     (all labs ordered are listed, but only abnormal results are displayed) Labs Reviewed  CBC WITH DIFFERENTIAL/PLATELET - Abnormal; Notable for the following components:      Result Value   RBC 3.05 (*)    Hemoglobin 9.0 (*)    HCT 28.1 (*)    Platelets 142 (*)    All other components within normal limits  COMPREHENSIVE METABOLIC PANEL WITH GFR - Abnormal; Notable for the following components:   CO2 19 (*)    Glucose, Bld 105 (*)    BUN 44 (*)    Creatinine, Ser 2.83 (*)    Total Protein 6.3 (*)    Albumin  3.0 (*)    Total Bilirubin 1.5 (*)    GFR, Estimated 23 (*)    All other components within normal limits  PROTIME-INR - Abnormal; Notable for the following components:   Prothrombin  Time 20.1 (*)    INR 1.6 (*)    All other components within normal limits  SAMPLE TO BLOOD BANK    EKG: None  Radiology: CT L-SPINE NO CHARGE Result Date: 08/22/2024 EXAM: CT CHEST ABDOMEN PELVIS WITH THORACIC AND LUMBAR SPINE RECONSTRUCTIONS 08/22/2024 03:44:00 AM TECHNIQUE: CT of the chest, abdomen, pelvis was performed without the administration of intravenous contrast. Multiplanar reformatted images are provided for review, including reconstructed images of the thoracic and lumbar spine.  Automated exposure control, iterative reconstruction, and/or weight based adjustment of the mA/kV was utilized to reduce the radiation dose to as low as reasonably achievable. COMPARISON: Portable chest 07/10/2024, chest CT without contrast 07/10/2024, chest CT high resolution study 09/08/2019, abdomen and pelvis CT without contrast 05/04/2024, abdomen and pelvis CT without contrast 04/01/2024. CLINICAL HISTORY: Polytrauma, blunt. Patient fell down an unknown number of steps from his wheelchair. FINDINGS: CT CHEST: THORACIC  AORTA: The aorta and great vessels are moderate to heavily calcified. No acute traumatic injury of the aorta. MEDIASTINUM: Single lead left chest pacemaker/aid, stable right ventricular wire insertion. The heart is moderately enlarged. No pericardial effusion. Left main and patchy 3-vessel coronary calcifications. Mild central pulmonary venous distention again noted with prominent pulmonary trunk measuring 3.4 cm, indicating arterial hypertension. A 1.2 cm precarinal lymph node to the right is again noted with similar sized left-sided prevascular nodes also again seen. Patulous esophagus with normal wall thickness. The trachea and main bronchi are patent. There is mild diffuse bronchial thickening. No mediastinal hematoma or pneumomediastinum. No acute traumatic injury to the heart or pericardium. LUNGS: Interval increased moderate sized right pleural effusion. Most of this does layer posteriorly, but some of it could be loculated in the chest base anteriorly. The density of the right pleural effusion is greater than simple fluid, measuring up to 33 Hounsfield units. A component of pleural hemorrhage or proteinaceous fluid such as due to infection or empyema, is not excluded. There is no air in the pleural fluid and no pneumothorax. There is consolidation versus compressive collapse over a majority of the right middle and lower lobes. Underlying pneumonia is a consideration. Nodules previously  newly identified in the base of the right upper lobe and in the right lower lobe, would be obscured by the pleural fluid if still present. Mosaic attenuation of the lungs continues to be seen, consistent with air trapping and small airway disease. The lungs are otherwise clear. No acute traumatic injury to the lungs. No pulmonary contusion or laceration. There previously was a small left pleural effusion, which has cleared. CHEST WALL: No acute displaced rib fracture. No chest wall hematoma. No chest wall mass. CT ABDOMEN AND PELVIS: ABDOMINAL AORTA: Heavy aortic and branch vessel atherosclerosis without aneurysm. No acute traumatic injury of the aorta or iliac arteries. HEPATOBILIARY: Hypoattenuating lesions in the dome of the liver, measuring 4.6 x 3.9 x 2 cm in segments 8 and 2 have been previously characterized as hemangiomas on a study of 09/19/2018. There have been no significant interval changes. The liver is dense, which could be seen with amiodarone  or heavy metal toxicity as well as hemochromatosis. There is cholelithiasis without gallbladder wall thickening or biliary dilatation. No acute liver abnormality is seen without contrast. SPLEEN: No acute traumatic injury. PANCREAS: No acute traumatic injury. ADRENAL GLANDS: No adrenal hemorrhage is seen. No acute traumatic injury. KIDNEYS: No renal or perinephric hemorrhage is seen. There is chronic perinephric stranding. No urinary stone or obstruction. No contour deforming mass of the unenhanced kidneys is seen. No acute traumatic injury. No hydronephrosis. GI TRACT: There is no bowel obstruction or inflammation. The gastric wall is unremarkable. The appendix is not seen. There is advanced sigmoid diverticulosis without diverticulitis. Small fat-containing umbilical hernia. No acute traumatic injury of the bowel. PERITONEUM: No ascites or free air. No free fluid or free hemorrhage. RETROPERITONEUM: No retroperitoneal hematoma. BLADDER: The bladder is  thickened but also not fully distended. Correlate clinically for cystitis. REPRODUCTIVE ORGANS: There are dystrophic calcifications within a mildly enlarged prostate gland. BONES: No acute traumatic fracture of the pelvis. 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 No acute regional skeletal fracture at the levels of the abdomen and pelvis. SOFT TISSUES: There is increased skin thickening and subcutaneous stranding over the low anterior abdominal wall, which could be congestive or due to cellulitis. Mild anasarca is seen in portions of the remaining body wall. THORACIC  AND LUMBAR SPINE: BONES AND ALIGNMENT: *   **Thoracic Spine:** * Acute oblique nondisplaced fracture through the mid to anterior T6 vertebral body extending through bridging enthesopathic bone, without evidence of fracture transmission into the posterior elements and pedicles. There is no loss of vertebral height at T6. * Broad-based mid to lower thoracic kyphosis centered at T10-T11, where there is again noted bulky bridging bone anteriorly between the segments, moderate wedging of both segments, with interbody ankylosis consistent with old injury, with old spinous process removal at both levels with dorsal bone graft fusion. . * The rest of the thoracic spine also has extensive bridging enthesopathy beginning at T2, resulting in multilevel interbody fusions. This is eccentric to the right anterolateral aspect. There is ankylosis over multiple spinous processes. Facet joints are also fused at multiple levels with facet hypertrophy. * Above and below T10 and T11 the thoracic vertebrae are normal in height. There is osteopenia. *   **Lumbar Spine:** *   No acute fracture is evident. * Chronic grade 1 degenerative anterolisthesis at L4-L5 without other abnormal alignment. * Advanced lumbar marginal osteophytosis with bulky bridging osteophytes T12-L1, L1-L2, L2-L3, and L4-L5 with attempted bridging osteophytes at L3-L4 and  L5-S1. * The discs are of normal height apart from chronic disc space widening at L3-L4. Thank you *   Advanced facet hypertrophy most levels. * The spinous processes were previously removed at L2, L3, and L4 with spinous process shaving at L5. DEGENERATIVE CHANGES: * **Thoracic Spine:** Varying degrees of multilevel acquired foraminal stenosis, but this is most severe on the left at T1-T2, on the right at T4-T5, on the right greater than left at T10-T11, on the right at T11-T12. Posterior endplate ridging at the level of T10-T11 does flatten the ventral thecal sac and cause 7 mm thecal sac AP stenosis and probable mild flattening of the ventral cord without frank impression. Other levels do not show noteworthy soft tissue or bony encroachment on the thecal sac. *   **Lumbar Spine:** Acquired spinal stenosis is noted all levels except L1-L2, due to ligamentous facet hypertrophy and posterior endplate ridging, but it is most severe at L3-L4 and L4-L5. Foraminal stenosis is moderate to severe bilaterally from L2-L3 through L4-L5. SOFT TISSUES: * **Thoracic Spine:** Mild edema in the adjacent soft tissues at T6, but there is no appreciable spinal canal hematoma. No paraspinal mass or hematoma. *   **Lumbar Spine:** No paraspinal mass or hematoma. IMPRESSION: 1. Acute oblique nondisplaced mid to anterior T6 vertebral body fracture extending through bridging enthesopathic bone without posterior element involvement, no vertebral height loss, with mild adjacent soft tissue edema and no appreciable spinal canal hematoma. 2. Interval increased moderate-sized right pleural effusion demonstrating increased attenuation suggesting hemorrhagic or proteinaceous content; no pneumothorax. The rib cage is intact . 3. Right middle and lower lobe consolidation versus compressive atelectasis, with pneumonia and secondary empyema a consideration. 4. 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 5. Severe lumbar spinal stenosis at L3-L4 and L4-L5 with moderate to severe bilateral foraminal stenosis from L2-3 through L4-5. 6. Enlarged prostate. 7. Dense liver suggesting metallic or amiodarone  toxicity. 8. Cholelithiasis. 9. Cardiomegaly with aortic and coronary atherosclerosis. 10. Body wall edema, with increased skin thickening and stranding in the lower end of the ear canal which could represent cerumen or mastoid cellulitis. 11. Advanced sigmoid  diverticulosis and additional detailed findings discussed above. Electronically signed by: Francis Quam MD 08/22/2024 05:00 AM EST RP Workstation: [id number]   CT T-SPINE NO CHARGE Result Date: 08/22/2024 EXAM: CT CHEST ABDOMEN PELVIS WITH THORACIC AND LUMBAR SPINE RECONSTRUCTIONS 08/22/2024 03:44:00 AM TECHNIQUE: CT of the chest, abdomen, pelvis was performed without the administration of intravenous contrast. Multiplanar reformatted images are provided for review, including reconstructed images of the thoracic and lumbar spine. Automated exposure control, iterative reconstruction, and/or weight based adjustment of the mA/kV was utilized to reduce the radiation dose to as low as reasonably achievable. COMPARISON: Portable chest 07/10/2024, chest CT without contrast 07/10/2024, chest CT high resolution study 09/08/2019, abdomen and pelvis CT without contrast 05/04/2024, abdomen and pelvis CT without contrast 04/01/2024. CLINICAL HISTORY: Polytrauma, blunt. Patient fell down an unknown number of steps from his wheelchair. FINDINGS: CT CHEST: THORACIC AORTA: The aorta and great vessels are moderate to heavily calcified. No acute traumatic injury of the aorta. MEDIASTINUM: Single lead left chest pacemaker/aid, stable right ventricular wire insertion. The heart is moderately enlarged. No  pericardial effusion. Left main and patchy 3-vessel coronary calcifications. Mild central pulmonary venous distention again noted with prominent pulmonary trunk measuring 3.4 cm, indicating arterial hypertension. A 1.2 cm precarinal lymph node to the right is again noted with similar sized left-sided prevascular nodes also again seen. Patulous esophagus with normal wall thickness. The trachea and main bronchi are patent. There is mild diffuse bronchial thickening. No mediastinal hematoma or pneumomediastinum. No acute traumatic injury to the heart or pericardium. LUNGS: Interval increased moderate sized right pleural effusion. Most of this does layer posteriorly, but some of it could be loculated in the chest base anteriorly. The density of the right pleural effusion is greater than simple fluid, measuring up to 33 Hounsfield units. A component of pleural hemorrhage or proteinaceous fluid such as due to infection or empyema, is not excluded. There is no air in the pleural fluid and no pneumothorax. There is consolidation versus compressive collapse over a majority of the right middle and lower lobes. Underlying pneumonia is a consideration. Nodules previously newly identified in the base of the right upper lobe and in the right lower lobe, would be obscured by the pleural fluid if still present. Mosaic attenuation of the lungs continues to be seen, consistent with air trapping and small airway disease. The lungs are otherwise clear. No acute traumatic injury to the lungs. No pulmonary contusion or laceration. There previously was a small left pleural effusion, which has cleared. CHEST WALL: No acute displaced rib fracture. No chest wall hematoma. No chest wall mass. CT ABDOMEN AND PELVIS: ABDOMINAL AORTA: Heavy aortic and branch vessel atherosclerosis without aneurysm. No acute traumatic injury of the aorta or iliac arteries. HEPATOBILIARY: Hypoattenuating lesions in the dome of the liver, measuring 4.6 x 3.9 x 2 cm  in segments 8 and 2 have been previously characterized as hemangiomas on a study of 09/19/2018. There have been no significant interval changes. The liver is dense, which could be seen with amiodarone  or heavy metal toxicity as well as hemochromatosis. There is cholelithiasis without gallbladder wall thickening or biliary dilatation. No acute liver abnormality is seen without contrast. SPLEEN: No acute traumatic injury. PANCREAS: No acute traumatic injury. ADRENAL GLANDS: No adrenal hemorrhage is seen. No acute traumatic injury. KIDNEYS: No renal or perinephric hemorrhage is seen. There is chronic perinephric stranding. No urinary stone or obstruction. No contour deforming mass of the unenhanced kidneys is seen. No acute traumatic injury. No hydronephrosis.  GI TRACT: There is no bowel obstruction or inflammation. The gastric wall is unremarkable. The appendix is not seen. There is advanced sigmoid diverticulosis without diverticulitis. Small fat-containing umbilical hernia. No acute traumatic injury of the bowel. PERITONEUM: No ascites or free air. No free fluid or free hemorrhage. RETROPERITONEUM: No retroperitoneal hematoma. BLADDER: The bladder is thickened but also not fully distended. Correlate clinically for cystitis. REPRODUCTIVE ORGANS: There are dystrophic calcifications within a mildly enlarged prostate gland. BONES: No acute traumatic fracture of the pelvis. 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 No acute regional skeletal fracture at the levels of the abdomen and pelvis. SOFT TISSUES: There is increased skin thickening and subcutaneous stranding over the low anterior abdominal wall, which could be congestive or due to cellulitis. Mild anasarca is seen in portions of the remaining body wall. THORACIC AND LUMBAR SPINE: BONES AND ALIGNMENT: *   **Thoracic Spine:** * Acute oblique nondisplaced fracture through the mid to anterior T6 vertebral body extending through bridging  enthesopathic bone, without evidence of fracture transmission into the posterior elements and pedicles. There is no loss of vertebral height at T6. * Broad-based mid to lower thoracic kyphosis centered at T10-T11, where there is again noted bulky bridging bone anteriorly between the segments, moderate wedging of both segments, with interbody ankylosis consistent with old injury, with old spinous process removal at both levels with dorsal bone graft fusion. . * The rest of the thoracic spine also has extensive bridging enthesopathy beginning at T2, resulting in multilevel interbody fusions. This is eccentric to the right anterolateral aspect. There is ankylosis over multiple spinous processes. Facet joints are also fused at multiple levels with facet hypertrophy. * Above and below T10 and T11 the thoracic vertebrae are normal in height. There is osteopenia. *   **Lumbar Spine:** *   No acute fracture is evident. * Chronic grade 1 degenerative anterolisthesis at L4-L5 without other abnormal alignment. * Advanced lumbar marginal osteophytosis with bulky bridging osteophytes T12-L1, L1-L2, L2-L3, and L4-L5 with attempted bridging osteophytes at L3-L4 and L5-S1. * The discs are of normal height apart from chronic disc space widening at L3-L4. Thank you *   Advanced facet hypertrophy most levels. * The spinous processes were previously removed at L2, L3, and L4 with spinous process shaving at L5. DEGENERATIVE CHANGES: * **Thoracic Spine:** Varying degrees of multilevel acquired foraminal stenosis, but this is most severe on the left at T1-T2, on the right at T4-T5, on the right greater than left at T10-T11, on the right at T11-T12. Posterior endplate ridging at the level of T10-T11 does flatten the ventral thecal sac and cause 7 mm thecal sac AP stenosis and probable mild flattening of the ventral cord without frank impression. Other levels do not show noteworthy soft tissue or bony encroachment on the thecal sac. *    **Lumbar Spine:** Acquired spinal stenosis is noted all levels except L1-L2, due to ligamentous facet hypertrophy and posterior endplate ridging, but it is most severe at L3-L4 and L4-L5. Foraminal stenosis is moderate to severe bilaterally from L2-L3 through L4-L5. SOFT TISSUES: * **Thoracic Spine:** Mild edema in the adjacent soft tissues at T6, but there is no appreciable spinal canal hematoma. No paraspinal mass or hematoma. *   **Lumbar Spine:** No paraspinal mass or hematoma. IMPRESSION: 1. Acute oblique nondisplaced mid to anterior T6 vertebral body fracture extending through bridging enthesopathic bone without posterior element involvement, no vertebral height loss, with mild adjacent soft tissue edema and no  appreciable spinal canal hematoma. 2. Interval increased moderate-sized right pleural effusion demonstrating increased attenuation suggesting hemorrhagic or proteinaceous content; no pneumothorax. The rib cage is intact . 3. Right middle and lower lobe consolidation versus compressive atelectasis, with pneumonia and secondary empyema a consideration. 4. 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 5. Severe lumbar spinal stenosis at L3-L4 and L4-L5 with moderate to severe bilateral foraminal stenosis from L2-3 through L4-5. 6. Enlarged prostate. 7. Dense liver suggesting metallic or amiodarone  toxicity. 8. Cholelithiasis. 9. Cardiomegaly with aortic and coronary atherosclerosis. 10. Body wall edema, with increased skin thickening and stranding in the lower end of the ear canal which could represent cerumen or mastoid cellulitis. 11. Advanced sigmoid diverticulosis and additional detailed findings discussed above. Electronically signed by: Francis Quam MD 08/22/2024 05:00 AM EST RP Workstation:  [id number]   CT CHEST ABDOMEN PELVIS WO CONTRAST Result Date: 08/22/2024 EXAM: CT CHEST ABDOMEN PELVIS WITH THORACIC AND LUMBAR SPINE RECONSTRUCTIONS 08/22/2024 03:44:00 AM TECHNIQUE: CT of the chest, abdomen, pelvis was performed without the administration of intravenous contrast. Multiplanar reformatted images are provided for review, including reconstructed images of the thoracic and lumbar spine. Automated exposure control, iterative reconstruction, and/or weight based adjustment of the mA/kV was utilized to reduce the radiation dose to as low as reasonably achievable. COMPARISON: Portable chest 07/10/2024, chest CT without contrast 07/10/2024, chest CT high resolution study 09/08/2019, abdomen and pelvis CT without contrast 05/04/2024, abdomen and pelvis CT without contrast 04/01/2024. CLINICAL HISTORY: Polytrauma, blunt. Patient fell down an unknown number of steps from his wheelchair. FINDINGS: CT CHEST: THORACIC AORTA: The aorta and great vessels are moderate to heavily calcified. No acute traumatic injury of the aorta. MEDIASTINUM: Single lead left chest pacemaker/aid, stable right ventricular wire insertion. The heart is moderately enlarged. No pericardial effusion. Left main and patchy 3-vessel coronary calcifications. Mild central pulmonary venous distention again noted with prominent pulmonary trunk measuring 3.4 cm, indicating arterial hypertension. A 1.2 cm precarinal lymph node to the right is again noted with similar sized left-sided prevascular nodes also again seen. Patulous esophagus with normal wall thickness. The trachea and main bronchi are patent. There is mild diffuse bronchial thickening. No mediastinal hematoma or pneumomediastinum. No acute traumatic injury to the heart or pericardium. LUNGS: Interval increased moderate sized right pleural effusion. Most of this does layer posteriorly, but some of it could be loculated in the chest base anteriorly. The density of the right pleural  effusion is greater than simple fluid, measuring up to 33 Hounsfield units. A component of pleural hemorrhage or proteinaceous fluid such as due to infection or empyema, is not excluded. There is no air in the pleural fluid and no pneumothorax. There is consolidation versus compressive collapse over a majority of the right middle and lower lobes. Underlying pneumonia is a consideration. Nodules previously newly identified in the base of the right upper lobe and in the right lower lobe, would be obscured by the pleural fluid if still present. Mosaic attenuation of the lungs continues to be seen, consistent with air trapping and small airway disease. The lungs are otherwise clear. No acute traumatic injury to the lungs. No pulmonary contusion or laceration. There previously was a small left pleural effusion, which has cleared. CHEST WALL: No acute displaced rib fracture. No chest wall hematoma. No chest wall mass. CT ABDOMEN AND PELVIS: ABDOMINAL  AORTA: Heavy aortic and branch vessel atherosclerosis without aneurysm. No acute traumatic injury of the aorta or iliac arteries. HEPATOBILIARY: Hypoattenuating lesions in the dome of the liver, measuring 4.6 x 3.9 x 2 cm in segments 8 and 2 have been previously characterized as hemangiomas on a study of 09/19/2018. There have been no significant interval changes. The liver is dense, which could be seen with amiodarone  or heavy metal toxicity as well as hemochromatosis. There is cholelithiasis without gallbladder wall thickening or biliary dilatation. No acute liver abnormality is seen without contrast. SPLEEN: No acute traumatic injury. PANCREAS: No acute traumatic injury. ADRENAL GLANDS: No adrenal hemorrhage is seen. No acute traumatic injury. KIDNEYS: No renal or perinephric hemorrhage is seen. There is chronic perinephric stranding. No urinary stone or obstruction. No contour deforming mass of the unenhanced kidneys is seen. No acute traumatic injury. No hydronephrosis.  GI TRACT: There is no bowel obstruction or inflammation. The gastric wall is unremarkable. The appendix is not seen. There is advanced sigmoid diverticulosis without diverticulitis. Small fat-containing umbilical hernia. No acute traumatic injury of the bowel. PERITONEUM: No ascites or free air. No free fluid or free hemorrhage. RETROPERITONEUM: No retroperitoneal hematoma. BLADDER: The bladder is thickened but also not fully distended. Correlate clinically for cystitis. REPRODUCTIVE ORGANS: There are dystrophic calcifications within a mildly enlarged prostate gland. BONES: No acute traumatic fracture of the pelvis. 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 No acute regional skeletal fracture at the levels of the abdomen and pelvis. SOFT TISSUES: There is increased skin thickening and subcutaneous stranding over the low anterior abdominal wall, which could be congestive or due to cellulitis. Mild anasarca is seen in portions of the remaining body wall. THORACIC AND LUMBAR SPINE: BONES AND ALIGNMENT: *   **Thoracic Spine:** * Acute oblique nondisplaced fracture through the mid to anterior T6 vertebral body extending through bridging enthesopathic bone, without evidence of fracture transmission into the posterior elements and pedicles. There is no loss of vertebral height at T6. * Broad-based mid to lower thoracic kyphosis centered at T10-T11, where there is again noted bulky bridging bone anteriorly between the segments, moderate wedging of both segments, with interbody ankylosis consistent with old injury, with old spinous process removal at both levels with dorsal bone graft fusion. . * The rest of the thoracic spine also has extensive bridging enthesopathy beginning at T2, resulting in multilevel interbody fusions. This is eccentric to the right anterolateral aspect. There is ankylosis over multiple spinous processes. Facet joints are also fused at multiple levels with facet  hypertrophy. * Above and below T10 and T11 the thoracic vertebrae are normal in height. There is osteopenia. *   **Lumbar Spine:** *   No acute fracture is evident. * Chronic grade 1 degenerative anterolisthesis at L4-L5 without other abnormal alignment. * Advanced lumbar marginal osteophytosis with bulky bridging osteophytes T12-L1, L1-L2, L2-L3, and L4-L5 with attempted bridging osteophytes at L3-L4 and L5-S1. * The discs are of normal height apart from chronic disc space widening at L3-L4. Thank you *   Advanced facet hypertrophy most levels. * The spinous processes were previously removed at L2, L3, and L4 with spinous process shaving at L5. DEGENERATIVE CHANGES: * **Thoracic Spine:** Varying degrees of multilevel acquired foraminal stenosis, but this is most severe on the left at T1-T2, on the right at T4-T5, on the right greater than left at T10-T11, on the right at T11-T12. Posterior endplate ridging at the level of T10-T11 does flatten the ventral  thecal sac and cause 7 mm thecal sac AP stenosis and probable mild flattening of the ventral cord without frank impression. Other levels do not show noteworthy soft tissue or bony encroachment on the thecal sac. *   **Lumbar Spine:** Acquired spinal stenosis is noted all levels except L1-L2, due to ligamentous facet hypertrophy and posterior endplate ridging, but it is most severe at L3-L4 and L4-L5. Foraminal stenosis is moderate to severe bilaterally from L2-L3 through L4-L5. SOFT TISSUES: * **Thoracic Spine:** Mild edema in the adjacent soft tissues at T6, but there is no appreciable spinal canal hematoma. No paraspinal mass or hematoma. *   **Lumbar Spine:** No paraspinal mass or hematoma. IMPRESSION: 1. Acute oblique nondisplaced mid to anterior T6 vertebral body fracture extending through bridging enthesopathic bone without posterior element involvement, no vertebral height loss, with mild adjacent soft tissue edema and no appreciable spinal canal hematoma.  2. Interval increased moderate-sized right pleural effusion demonstrating increased attenuation suggesting hemorrhagic or proteinaceous content; no pneumothorax. The rib cage is intact . 3. Right middle and lower lobe consolidation versus compressive atelectasis, with pneumonia and secondary empyema a consideration. 4. 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 5. Severe lumbar spinal stenosis at L3-L4 and L4-L5 with moderate to severe bilateral foraminal stenosis from L2-3 through L4-5. 6. Enlarged prostate. 7. Dense liver suggesting metallic or amiodarone  toxicity. 8. Cholelithiasis. 9. Cardiomegaly with aortic and coronary atherosclerosis. 10. Body wall edema, with increased skin thickening and stranding in the lower end of the ear canal which could represent cerumen or mastoid cellulitis. 11. Advanced sigmoid diverticulosis and additional detailed findings discussed above. Electronically signed by: Francis Quam MD 08/22/2024 05:00 AM EST RP Workstation: [id number]     .Critical Care  Performed by: Keith Sor, PA-C Authorized by: Keith Sor, PA-C   Critical care provider statement:    Critical care time (minutes):  30   Critical care time was exclusive of:  Separately billable procedures and treating other patients   Critical care was necessary to treat or prevent imminent or life-threatening deterioration of the following conditions:  Respiratory failure (traumatic pleural effusion; hypoxemia)   Critical care was time spent personally by me on the following activities:  Development of treatment plan with patient or surrogate, discussions with consultants, evaluation of patient's response to treatment, examination of patient, ordering and review of laboratory studies, ordering and review  of radiographic studies, ordering and performing treatments and interventions, pulse oximetry, re-evaluation of patient's condition, review of old charts and obtaining history from patient or surrogate   I assumed direction of critical care for this patient from another provider in my specialty: no     Care discussed with: admitting provider      Medications Ordered in the ED  oxyCODONE -acetaminophen  (PERCOCET/ROXICET) 5-325 MG per tablet 1 tablet (1 tablet Oral Given 08/22/24 0321)    Clinical Course as of 08/22/24 0600  Fri Aug 22, 2024  0536 Spoke with Dr. Sebastian of trauma surgery regarding the patient's imaging results.  Trauma surgery to assess patient and consult during admission.  Given his chronic comorbidities, they are requesting involvement of the hospitalist service for admission.  Dr. Sebastian also requests consultation with neurosurgery to discuss recommendations for management of T6 fracture. [KH]  [phone] Patient placed on supplemental oxygen for dyspnea and saturations of 92% on room air.  Presently with oxygen saturations of 100% on nasal cannula. [KH]  9445 Spoke with Orlean, FNP of neurosurgery. Recommends TLSO brace. This has been ordered. Neurosurgery to see this AM in consultation when rounding in the hospital. [KH]  0600 Case discussed with Dr. Charlton of [facility] who will admit. [KH]    Clinical Course User Index [KH] Keith Sor, PA-C                                 Medical Decision Making Amount and/or Complexity of Data Reviewed Labs: ordered. Radiology: ordered.  Risk Prescription drug management. Decision regarding hospitalization.   This patient presents to the ED for concern of back pain and R sided chest pain, this involves an extensive number of treatment options, and is a complaint that carries with it a high risk of complications and morbidity.  The differential diagnosis includes fracture vs vertebral listhesis vs PTX vs pleural effusion vs  hemorrhage.   Co morbidities that complicate the patient evaluation  ICM  HFrEF PAF   Additional history obtained:  Additional history obtained from EMS personnel External records from outside source obtained and reviewed including chest CT from October 2024   Lab Tests:  I Ordered, and personally interpreted labs.  The pertinent results include:  Hgb 9.0. Creatinine 2.83 (baseline ~2.4)   Imaging Studies ordered:  I ordered imaging studies including CT chest/abd/pelvis withoug contrast     I independently visualized and interpreted imaging which showed suspected hemorrhagic effusion around the R lung.  I agree with the radiologist interpretation   Cardiac Monitoring:  The patient was maintained on a cardiac monitor.  I personally viewed and interpreted the cardiac monitored which showed an underlying rhythm of: NSR   Medicines ordered and prescription drug management:  I ordered medication including Percocet for pain  Reevaluation of the patient after these medicines showed that the patient improved I have reviewed the patients home medicines and have made adjustments as needed   Test Considered:  CT chest/abd/pelvis with contrast - held given contrast allergy Considered CT head; however, trauma occurred 2 days ago and patient without external signs of head injury, denies headache.   Critical Interventions:  Application of supplemental oxygen Pain control   Consultations Obtained:  I requested consultation with Dr. Sebastian of trauma surgery as well as Meyran, FNP of Neurosurgery. Recommendations noted in ED course documentation.   Problem List / ED Course:  As above   Reevaluation:  After the interventions noted above, I reevaluated the patient and found that they have :stayed the same   Social Determinants of Health:  Use of assist device with ambulation   Dispostion:  After consideration of the diagnostic results and the patients response  to treatment, I feel that the patent would benefit from admission for further evauation, management.      Final diagnoses:  Fall down stairs, initial encounter  Pleural effusion on right  Closed fracture of thoracic vertebral body [facility])    ED Discharge Orders     None          Keith Sor, PA-C 08/22/24 [id number]    Trine Raynell Moder, MD 08/22/24 [phone]

## 2024-08-22 NOTE — Consult Note (Signed)
 Reason for Consult:T6 fracture Referring Physician: edp  Dean Croft Sr. is an 73 y.o. male.   HPI:  73 year old male presented to the ED yesterday after falling down 8 stairs in his wheelchair on Wednesday. He is reporting pretty severe back pain on the right and leg pain. He has had previous lumbar surgery with Dr. Louis. No NT or W in his legs.   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 09/2016 s/p MDT Visia AF MRI single lead ICD (ser # [id number]).   Arthritis    maybe in his spine (09/05/2016)   Asthma    pt. denies   Cardiac arrest (HCC)    a. 08/2016 VT/VF Arrest-->09/2016 s/p MDT Visia AF MRI single lead ICD (ser # [id number]).   Chronic HFrEF (heart failure with reduced ejection fraction) (HCC)    a. 08/2016 Echo: EF 35-40%; b. 04/2018 Echo: EF 30-35%; c. 04/2020 Echo: EF 30-35%; d. 11/2021 Echo: EF 40-45%, GrI DD, nl RV fxn, sev dil LA, mild-mod MR, mild-mod AI, Ao root 38mm.   Coronary artery disease    a. 08/2016 VT Arrest/Cath: LM nl, LAD nl, LCX nl, RGCA 90p, 100d w/ L->R collats, EF 35-45%.   Gout    Hyperlipidemia LDL goal <70    Hypertension    Ischemic cardiomyopathy    a. 08/2016 Echo: EF 35-40%; b. 04/2018 Echo: EF 30-35%; c. 04/2020 Echo: EF 30-35%; d. 11/2021 Echo: EF 40-45%.   LBBB (left bundle branch block)    OSA on CPAP    does not wear CPAP   PAF (paroxysmal atrial fibrillation) (HCC)    a. CHA2DS2VASc = 5-->Eliquis .   Type II diabetes mellitus (HCC)     Past Surgical History:  Procedure Laterality Date   CARDIAC CATHETERIZATION N/A 09/07/2016   Procedure: Left Heart Cath and Coronary Angiography;  Surgeon: Peter M Jordan, MD;  Location: [facility] INVASIVE CV LAB;  Service: Cardiovascular;  Laterality: N/A;   CENTRAL LINE INSERTION  05/06/2024   Procedure: CENTRAL LINE INSERTION;  Surgeon: Cherrie Toribio SAUNDERS, MD;  Location: [facility] INVASIVE CV LAB;  Service: Cardiovascular;;   COLONOSCOPY WITH  PROPOFOL  N/A 08/30/2018   Procedure: COLONOSCOPY WITH PROPOFOL ;  Surgeon: Rollin Dover, MD;  Location: [facility]COPY;  Service: Endoscopy;  Laterality: N/A;   COLONOSCOPY WITH PROPOFOL  N/A 11/16/2023   Procedure: COLONOSCOPY WITH PROPOFOL ;  Surgeon: Rollin Dover, MD;  Location: [facility];  Service: Gastroenterology;  Laterality: N/A;   COLONOSCOPY WITH PROPOFOL  N/A 11/18/2023   Procedure: COLONOSCOPY WITH PROPOFOL ;  Surgeon: Charlanne Groom, MD;  Location: [facility];  Service: Gastroenterology;  Laterality: N/A;   EP IMPLANTABLE DEVICE N/A 10/10/2016   Procedure: ICD Implant;  Surgeon: Will Gladis Norton, MD;  Location: [facility] INVASIVE CV LAB;  Service: Cardiovascular;  Laterality: N/A;   HEMOSTASIS CLIP PLACEMENT  11/16/2023   Procedure: HEMOSTASIS CLIP PLACEMENT;  Surgeon: Rollin Dover, MD;  Location: [facility];  Service: Gastroenterology;;   HEMOSTASIS CLIP PLACEMENT  11/18/2023   Procedure: HEMOSTASIS CLIP PLACEMENT;  Surgeon: Charlanne Groom, MD;  Location: [facility];  Service: Gastroenterology;;   HEMOSTASIS CONTROL  11/18/2023   Procedure: HEMOSTASIS CONTROL;  Surgeon: Charlanne Groom, MD;  Location: [facility];  Service: Gastroenterology;;  PuraStat   LUMBAR LAMINECTOMY/DECOMPRESSION MICRODISCECTOMY N/A 01/18/2018   Procedure: L2-3, L3-4, L-4-5 CENTRAL DECOMPRESSION;  Surgeon: Barbarann Oneil BROCKS, MD;  Location: [facility] OR;  Service: Orthopedics;  Laterality: N/A;   LUMBAR LAMINECTOMY/DECOMPRESSION MICRODISCECTOMY N/A 08/12/2020  Procedure: THORACIC ELEVEN LAMINECTOMY;  Surgeon: Louis Shove, MD;  Location: [facility] OR;  Service: Neurosurgery;  Laterality: N/A;   POLYPECTOMY  08/30/2018   Procedure: POLYPECTOMY;  Surgeon: Rollin Dover, MD;  Location: [facility];  Service: Endoscopy;;   POLYPECTOMY  11/16/2023   Procedure: POLYPECTOMY;  Surgeon: Rollin Dover, MD;  Location: [facility];  Service: Gastroenterology;;   RIGHT HEART CATH N/A 05/06/2024   Procedure: RIGHT HEART CATH;  Surgeon: Cherrie Toribio SAUNDERS, MD;  Location: [facility] INVASIVE CV LAB;  Service: Cardiovascular;  Laterality: N/A;   TRANSESOPHAGEAL ECHOCARDIOGRAM (CATH LAB) N/A 06/09/2024   Procedure: TRANSESOPHAGEAL ECHOCARDIOGRAM;  Surgeon: [name] Morene PARAS, MD;  Location: [facility] INVASIVE CV LAB;  Service: Cardiovascular;  Laterality: N/A;    Allergies  Allergen Reactions   Contrast Media [Iodinated Contrast Media] Shortness Of Breath and Nausea And Vomiting   Lisinopril  Cough    Social History   Tobacco Use   Smoking status: Never   Smokeless tobacco: Never  Substance Use Topics   Alcohol use: No    Family History  Problem Relation Age of Onset   Heart attack Father        died in his 33's   Hypertension Sister    Cancer Brother        uncertain, leg   Hypertension Sister      Review of Systems  Positive ROS: as above  All other systems have been reviewed and were otherwise negative with the exception of those mentioned in the HPI and as above.  Objective: Vital signs in last 24 hours: Temp:  [97.8 F (36.6 C)-98.3 F (36.8 C)] 98.3 F (36.8 C) (11/28 0605) Pulse Rate:  [86-91] 91 (11/28 0624) Resp:  [16-25] 25 (11/28 0624) BP: (131-151)/(49-57) 134/49 (11/28 0624) SpO2:  [95 %-100 %] 100 % (11/28 0624) Weight:  [83.9 kg] 83.9 kg (11/28 0259)  General Appearance: Alert, cooperative, no distress, appears stated age Head: Normocephalic, without obvious abnormality, atraumatic Eyes: PERRL, conjunctiva/corneas clear, EOM's intact, fundi benign, both eyes      Lungs: respirations unlabored Heart: Regular rate and rhythm Pulses: 2+ and symmetric all extremities Skin: Skin color, texture, turgor normal, no rashes or lesions  NEUROLOGIC:   Mental status: A&O x4, no aphasia, good attention span, Memory and fund of knowledge Motor Exam - grossly normal, normal tone and bulk, right knee extension 4/5 right hip flexor 4/5 (baseline) Sensory Exam - grossly normal Reflexes: symmetric, no pathologic reflexes,  No Hoffman's, No clonus Coordination - grossly normal Gait -not tested Balance -not tested Cranial Nerves: I: smell Not tested  II: visual acuity  OS: na    OD: na  II: visual fields Full to confrontation  II: pupils Equal, round, reactive to light  III,VII: ptosis None  III,IV,VI: extraocular muscles  Full ROM  V: mastication   V: facial light touch sensation    V,VII: corneal reflex    VII: facial muscle function - upper    VII: facial muscle function - lower   VIII: hearing   IX: soft palate elevation    IX,X: gag reflex   XI: trapezius strength    XI: sternocleidomastoid strength   XI: neck flexion strength    XII: tongue strength      Data Review Lab Results  Component Value Date   WBC 6.8 08/22/2024   HGB 9.0 (L) 08/22/2024   HCT 28.1 (L) 08/22/2024   MCV 92.1 08/22/2024   PLT 142 (L) 08/22/2024   Lab Results  Component  Value Date   NA 135 08/22/2024   K 4.1 08/22/2024   CL 103 08/22/2024   CO2 19 (L) 08/22/2024   BUN 44 (H) 08/22/2024   CREATININE 2.83 (H) 08/22/2024   GLUCOSE 105 (H) 08/22/2024   Lab Results  Component Value Date   INR 1.6 (H) 08/22/2024    Radiology: CT L-SPINE NO CHARGE Result Date: 08/22/2024 EXAM: CT CHEST ABDOMEN PELVIS WITH THORACIC AND LUMBAR SPINE RECONSTRUCTIONS 08/22/2024 03:44:00 AM TECHNIQUE: CT of the chest, abdomen, pelvis was performed without the administration of intravenous contrast. Multiplanar reformatted images are provided for review, including reconstructed images of the thoracic and lumbar spine. Automated exposure control, iterative reconstruction, and/or weight based adjustment of the mA/kV was utilized to reduce the radiation dose to as low as reasonably achievable. COMPARISON: Portable chest 07/10/2024, chest CT without contrast 07/10/2024, chest CT high resolution study 09/08/2019, abdomen and pelvis CT without contrast 05/04/2024, abdomen and pelvis CT without contrast 04/01/2024. CLINICAL HISTORY: Polytrauma,  blunt. Patient fell down an unknown number of steps from his wheelchair. FINDINGS: CT CHEST: THORACIC AORTA: The aorta and great vessels are moderate to heavily calcified. No acute traumatic injury of the aorta. MEDIASTINUM: Single lead left chest pacemaker/aid, stable right ventricular wire insertion. The heart is moderately enlarged. No pericardial effusion. Left main and patchy 3-vessel coronary calcifications. Mild central pulmonary venous distention again noted with prominent pulmonary trunk measuring 3.4 cm, indicating arterial hypertension. A 1.2 cm precarinal lymph node to the right is again noted with similar sized left-sided prevascular nodes also again seen. Patulous esophagus with normal wall thickness. The trachea and main bronchi are patent. There is mild diffuse bronchial thickening. No mediastinal hematoma or pneumomediastinum. No acute traumatic injury to the heart or pericardium. LUNGS: Interval increased moderate sized right pleural effusion. Most of this does layer posteriorly, but some of it could be loculated in the chest base anteriorly. The density of the right pleural effusion is greater than simple fluid, measuring up to 33 Hounsfield units. A component of pleural hemorrhage or proteinaceous fluid such as due to infection or empyema, is not excluded. There is no air in the pleural fluid and no pneumothorax. There is consolidation versus compressive collapse over a majority of the right middle and lower lobes. Underlying pneumonia is a consideration. Nodules previously newly identified in the base of the right upper lobe and in the right lower lobe, would be obscured by the pleural fluid if still present. Mosaic attenuation of the lungs continues to be seen, consistent with air trapping and small airway disease. The lungs are otherwise clear. No acute traumatic injury to the lungs. No pulmonary contusion or laceration. There previously was a small left pleural effusion, which has cleared.  CHEST WALL: No acute displaced rib fracture. No chest wall hematoma. No chest wall mass. CT ABDOMEN AND PELVIS: ABDOMINAL AORTA: Heavy aortic and branch vessel atherosclerosis without aneurysm. No acute traumatic injury of the aorta or iliac arteries. HEPATOBILIARY: Hypoattenuating lesions in the dome of the liver, measuring 4.6 x 3.9 x 2 cm in segments 8 and 2 have been previously characterized as hemangiomas on a study of 09/19/2018. There have been no significant interval changes. The liver is dense, which could be seen with amiodarone  or heavy metal toxicity as well as hemochromatosis. There is cholelithiasis without gallbladder wall thickening or biliary dilatation. No acute liver abnormality is seen without contrast. SPLEEN: No acute traumatic injury. PANCREAS: No acute traumatic injury. ADRENAL GLANDS: No adrenal hemorrhage is seen. No  acute traumatic injury. KIDNEYS: No renal or perinephric hemorrhage is seen. There is chronic perinephric stranding. No urinary stone or obstruction. No contour deforming mass of the unenhanced kidneys is seen. No acute traumatic injury. No hydronephrosis. GI TRACT: There is no bowel obstruction or inflammation. The gastric wall is unremarkable. The appendix is not seen. There is advanced sigmoid diverticulosis without diverticulitis. Small fat-containing umbilical hernia. No acute traumatic injury of the bowel. PERITONEUM: No ascites or free air. No free fluid or free hemorrhage. RETROPERITONEUM: No retroperitoneal hematoma. BLADDER: The bladder is thickened but also not fully distended. Correlate clinically for cystitis. REPRODUCTIVE ORGANS: There are dystrophic calcifications within a mildly enlarged prostate gland. BONES: No acute traumatic fracture of the pelvis. 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 No acute regional skeletal fracture at the levels of the abdomen and pelvis. SOFT TISSUES: There is increased skin thickening and subcutaneous  stranding over the low anterior abdominal wall, which could be congestive or due to cellulitis. Mild anasarca is seen in portions of the remaining body wall. THORACIC AND LUMBAR SPINE: BONES AND ALIGNMENT: *   **Thoracic Spine:** * Acute oblique nondisplaced fracture through the mid to anterior T6 vertebral body extending through bridging enthesopathic bone, without evidence of fracture transmission into the posterior elements and pedicles. There is no loss of vertebral height at T6. * Broad-based mid to lower thoracic kyphosis centered at T10-T11, where there is again noted bulky bridging bone anteriorly between the segments, moderate wedging of both segments, with interbody ankylosis consistent with old injury, with old spinous process removal at both levels with dorsal bone graft fusion. . * The rest of the thoracic spine also has extensive bridging enthesopathy beginning at T2, resulting in multilevel interbody fusions. This is eccentric to the right anterolateral aspect. There is ankylosis over multiple spinous processes. Facet joints are also fused at multiple levels with facet hypertrophy. * Above and below T10 and T11 the thoracic vertebrae are normal in height. There is osteopenia. *   **Lumbar Spine:** *   No acute fracture is evident. * Chronic grade 1 degenerative anterolisthesis at L4-L5 without other abnormal alignment. * Advanced lumbar marginal osteophytosis with bulky bridging osteophytes T12-L1, L1-L2, L2-L3, and L4-L5 with attempted bridging osteophytes at L3-L4 and L5-S1. * The discs are of normal height apart from chronic disc space widening at L3-L4. Thank you *   Advanced facet hypertrophy most levels. * The spinous processes were previously removed at L2, L3, and L4 with spinous process shaving at L5. DEGENERATIVE CHANGES: * **Thoracic Spine:** Varying degrees of multilevel acquired foraminal stenosis, but this is most severe on the left at T1-T2, on the right at T4-T5, on the right greater  than left at T10-T11, on the right at T11-T12. Posterior endplate ridging at the level of T10-T11 does flatten the ventral thecal sac and cause 7 mm thecal sac AP stenosis and probable mild flattening of the ventral cord without frank impression. Other levels do not show noteworthy soft tissue or bony encroachment on the thecal sac. *   **Lumbar Spine:** Acquired spinal stenosis is noted all levels except L1-L2, due to ligamentous facet hypertrophy and posterior endplate ridging, but it is most severe at L3-L4 and L4-L5. Foraminal stenosis is moderate to severe bilaterally from L2-L3 through L4-L5. SOFT TISSUES: * **Thoracic Spine:** Mild edema in the adjacent soft tissues at T6, but there is no appreciable spinal canal hematoma. No paraspinal mass or hematoma. *   **Lumbar Spine:** No  paraspinal mass or hematoma. IMPRESSION: 1. Acute oblique nondisplaced mid to anterior T6 vertebral body fracture extending through bridging enthesopathic bone without posterior element involvement, no vertebral height loss, with mild adjacent soft tissue edema and no appreciable spinal canal hematoma. 2. Interval increased moderate-sized right pleural effusion demonstrating increased attenuation suggesting hemorrhagic or proteinaceous content; no pneumothorax. The rib cage is intact . 3. Right middle and lower lobe consolidation versus compressive atelectasis, with pneumonia and secondary empyema a consideration. 4. 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 5. Severe lumbar spinal stenosis at L3-L4 and L4-L5 with moderate to severe bilateral foraminal stenosis from L2-3 through L4-5. 6. Enlarged prostate. 7. Dense liver suggesting metallic or amiodarone  toxicity. 8. Cholelithiasis. 9. Cardiomegaly with aortic and coronary  atherosclerosis. 10. Body wall edema, with increased skin thickening and stranding in the lower end of the ear canal which could represent cerumen or mastoid cellulitis. 11. Advanced sigmoid diverticulosis and additional detailed findings discussed above. Electronically signed by: Francis Quam MD 08/22/2024 05:00 AM EST RP Workstation: [id number]   CT T-SPINE NO CHARGE Result Date: 08/22/2024 EXAM: CT CHEST ABDOMEN PELVIS WITH THORACIC AND LUMBAR SPINE RECONSTRUCTIONS 08/22/2024 03:44:00 AM TECHNIQUE: CT of the chest, abdomen, pelvis was performed without the administration of intravenous contrast. Multiplanar reformatted images are provided for review, including reconstructed images of the thoracic and lumbar spine. Automated exposure control, iterative reconstruction, and/or weight based adjustment of the mA/kV was utilized to reduce the radiation dose to as low as reasonably achievable. COMPARISON: Portable chest 07/10/2024, chest CT without contrast 07/10/2024, chest CT high resolution study 09/08/2019, abdomen and pelvis CT without contrast 05/04/2024, abdomen and pelvis CT without contrast 04/01/2024. CLINICAL HISTORY: Polytrauma, blunt. Patient fell down an unknown number of steps from his wheelchair. FINDINGS: CT CHEST: THORACIC AORTA: The aorta and great vessels are moderate to heavily calcified. No acute traumatic injury of the aorta. MEDIASTINUM: Single lead left chest pacemaker/aid, stable right ventricular wire insertion. The heart is moderately enlarged. No pericardial effusion. Left main and patchy 3-vessel coronary calcifications. Mild central pulmonary venous distention again noted with prominent pulmonary trunk measuring 3.4 cm, indicating arterial hypertension. A 1.2 cm precarinal lymph node to the right is again noted with similar sized left-sided prevascular nodes also again seen. Patulous esophagus with normal wall thickness. The trachea and main bronchi are patent. There is mild diffuse  bronchial thickening. No mediastinal hematoma or pneumomediastinum. No acute traumatic injury to the heart or pericardium. LUNGS: Interval increased moderate sized right pleural effusion. Most of this does layer posteriorly, but some of it could be loculated in the chest base anteriorly. The density of the right pleural effusion is greater than simple fluid, measuring up to 33 Hounsfield units. A component of pleural hemorrhage or proteinaceous fluid such as due to infection or empyema, is not excluded. There is no air in the pleural fluid and no pneumothorax. There is consolidation versus compressive collapse over a majority of the right middle and lower lobes. Underlying pneumonia is a consideration. Nodules previously newly identified in the base of the right upper lobe and in the right lower lobe, would be obscured by the pleural fluid if still present. Mosaic attenuation of the lungs continues to be seen, consistent with air trapping and small airway disease. The lungs are otherwise clear. No acute traumatic injury to the lungs. No  pulmonary contusion or laceration. There previously was a small left pleural effusion, which has cleared. CHEST WALL: No acute displaced rib fracture. No chest wall hematoma. No chest wall mass. CT ABDOMEN AND PELVIS: ABDOMINAL AORTA: Heavy aortic and branch vessel atherosclerosis without aneurysm. No acute traumatic injury of the aorta or iliac arteries. HEPATOBILIARY: Hypoattenuating lesions in the dome of the liver, measuring 4.6 x 3.9 x 2 cm in segments 8 and 2 have been previously characterized as hemangiomas on a study of 09/19/2018. There have been no significant interval changes. The liver is dense, which could be seen with amiodarone  or heavy metal toxicity as well as hemochromatosis. There is cholelithiasis without gallbladder wall thickening or biliary dilatation. No acute liver abnormality is seen without contrast. SPLEEN: No acute traumatic injury. PANCREAS: No acute  traumatic injury. ADRENAL GLANDS: No adrenal hemorrhage is seen. No acute traumatic injury. KIDNEYS: No renal or perinephric hemorrhage is seen. There is chronic perinephric stranding. No urinary stone or obstruction. No contour deforming mass of the unenhanced kidneys is seen. No acute traumatic injury. No hydronephrosis. GI TRACT: There is no bowel obstruction or inflammation. The gastric wall is unremarkable. The appendix is not seen. There is advanced sigmoid diverticulosis without diverticulitis. Small fat-containing umbilical hernia. No acute traumatic injury of the bowel. PERITONEUM: No ascites or free air. No free fluid or free hemorrhage. RETROPERITONEUM: No retroperitoneal hematoma. BLADDER: The bladder is thickened but also not fully distended. Correlate clinically for cystitis. REPRODUCTIVE ORGANS: There are dystrophic calcifications within a mildly enlarged prostate gland. BONES: No acute traumatic fracture of the pelvis. 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 No acute regional skeletal fracture at the levels of the abdomen and pelvis. SOFT TISSUES: There is increased skin thickening and subcutaneous stranding over the low anterior abdominal wall, which could be congestive or due to cellulitis. Mild anasarca is seen in portions of the remaining body wall. THORACIC AND LUMBAR SPINE: BONES AND ALIGNMENT: *   **Thoracic Spine:** * Acute oblique nondisplaced fracture through the mid to anterior T6 vertebral body extending through bridging enthesopathic bone, without evidence of fracture transmission into the posterior elements and pedicles. There is no loss of vertebral height at T6. * Broad-based mid to lower thoracic kyphosis centered at T10-T11, where there is again noted bulky bridging bone anteriorly between the segments, moderate wedging of both segments, with interbody ankylosis consistent with old injury, with old spinous process removal at both levels with dorsal bone  graft fusion. . * The rest of the thoracic spine also has extensive bridging enthesopathy beginning at T2, resulting in multilevel interbody fusions. This is eccentric to the right anterolateral aspect. There is ankylosis over multiple spinous processes. Facet joints are also fused at multiple levels with facet hypertrophy. * Above and below T10 and T11 the thoracic vertebrae are normal in height. There is osteopenia. *   **Lumbar Spine:** *   No acute fracture is evident. * Chronic grade 1 degenerative anterolisthesis at L4-L5 without other abnormal alignment. * Advanced lumbar marginal osteophytosis with bulky bridging osteophytes T12-L1, L1-L2, L2-L3, and L4-L5 with attempted bridging osteophytes at L3-L4 and L5-S1. * The discs are of normal height apart from chronic disc space widening at L3-L4. Thank you *   Advanced facet hypertrophy most levels. * The spinous processes were previously removed at L2, L3, and L4 with spinous process shaving at L5. DEGENERATIVE CHANGES: * **Thoracic Spine:** Varying degrees of multilevel acquired foraminal stenosis, but this is most severe  on the left at T1-T2, on the right at T4-T5, on the right greater than left at T10-T11, on the right at T11-T12. Posterior endplate ridging at the level of T10-T11 does flatten the ventral thecal sac and cause 7 mm thecal sac AP stenosis and probable mild flattening of the ventral cord without frank impression. Other levels do not show noteworthy soft tissue or bony encroachment on the thecal sac. *   **Lumbar Spine:** Acquired spinal stenosis is noted all levels except L1-L2, due to ligamentous facet hypertrophy and posterior endplate ridging, but it is most severe at L3-L4 and L4-L5. Foraminal stenosis is moderate to severe bilaterally from L2-L3 through L4-L5. SOFT TISSUES: * **Thoracic Spine:** Mild edema in the adjacent soft tissues at T6, but there is no appreciable spinal canal hematoma. No paraspinal mass or hematoma. *   **Lumbar  Spine:** No paraspinal mass or hematoma. IMPRESSION: 1. Acute oblique nondisplaced mid to anterior T6 vertebral body fracture extending through bridging enthesopathic bone without posterior element involvement, no vertebral height loss, with mild adjacent soft tissue edema and no appreciable spinal canal hematoma. 2. Interval increased moderate-sized right pleural effusion demonstrating increased attenuation suggesting hemorrhagic or proteinaceous content; no pneumothorax. The rib cage is intact . 3. Right middle and lower lobe consolidation versus compressive atelectasis, with pneumonia and secondary empyema a consideration. 4. 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 5. Severe lumbar spinal stenosis at L3-L4 and L4-L5 with moderate to severe bilateral foraminal stenosis from L2-3 through L4-5. 6. Enlarged prostate. 7. Dense liver suggesting metallic or amiodarone  toxicity. 8. Cholelithiasis. 9. Cardiomegaly with aortic and coronary atherosclerosis. 10. Body wall edema, with increased skin thickening and stranding in the lower end of the ear canal which could represent cerumen or mastoid cellulitis. 11. Advanced sigmoid diverticulosis and additional detailed findings discussed above. Electronically signed by: Francis Quam MD 08/22/2024 05:00 AM EST RP Workstation: [id number]   CT CHEST ABDOMEN PELVIS WO CONTRAST Result Date: 08/22/2024 EXAM: CT CHEST ABDOMEN PELVIS WITH THORACIC AND LUMBAR SPINE RECONSTRUCTIONS 08/22/2024 03:44:00 AM TECHNIQUE: CT of the chest, abdomen, pelvis was performed without the administration of intravenous contrast. Multiplanar reformatted images are provided for review, including reconstructed images of the thoracic and lumbar spine. Automated exposure control, iterative  reconstruction, and/or weight based adjustment of the mA/kV was utilized to reduce the radiation dose to as low as reasonably achievable. COMPARISON: Portable chest 07/10/2024, chest CT without contrast 07/10/2024, chest CT high resolution study 09/08/2019, abdomen and pelvis CT without contrast 05/04/2024, abdomen and pelvis CT without contrast 04/01/2024. CLINICAL HISTORY: Polytrauma, blunt. Patient fell down an unknown number of steps from his wheelchair. FINDINGS: CT CHEST: THORACIC AORTA: The aorta and great vessels are moderate to heavily calcified. No acute traumatic injury of the aorta. MEDIASTINUM: Single lead left chest pacemaker/aid, stable right ventricular wire insertion. The heart is moderately enlarged. No pericardial effusion. Left main and patchy 3-vessel coronary calcifications. Mild central pulmonary venous distention again noted with prominent pulmonary trunk measuring 3.4 cm, indicating arterial hypertension. A 1.2 cm precarinal lymph node to the right is again noted with similar sized left-sided prevascular nodes also again seen. Patulous esophagus with normal wall thickness. The trachea and main bronchi are patent. There is mild diffuse bronchial thickening. No mediastinal hematoma or pneumomediastinum. No acute traumatic injury to the heart or pericardium. LUNGS: Interval increased  moderate sized right pleural effusion. Most of this does layer posteriorly, but some of it could be loculated in the chest base anteriorly. The density of the right pleural effusion is greater than simple fluid, measuring up to 33 Hounsfield units. A component of pleural hemorrhage or proteinaceous fluid such as due to infection or empyema, is not excluded. There is no air in the pleural fluid and no pneumothorax. There is consolidation versus compressive collapse over a majority of the right middle and lower lobes. Underlying pneumonia is a consideration. Nodules previously newly identified in the base of the  right upper lobe and in the right lower lobe, would be obscured by the pleural fluid if still present. Mosaic attenuation of the lungs continues to be seen, consistent with air trapping and small airway disease. The lungs are otherwise clear. No acute traumatic injury to the lungs. No pulmonary contusion or laceration. There previously was a small left pleural effusion, which has cleared. CHEST WALL: No acute displaced rib fracture. No chest wall hematoma. No chest wall mass. CT ABDOMEN AND PELVIS: ABDOMINAL AORTA: Heavy aortic and branch vessel atherosclerosis without aneurysm. No acute traumatic injury of the aorta or iliac arteries. HEPATOBILIARY: Hypoattenuating lesions in the dome of the liver, measuring 4.6 x 3.9 x 2 cm in segments 8 and 2 have been previously characterized as hemangiomas on a study of 09/19/2018. There have been no significant interval changes. The liver is dense, which could be seen with amiodarone  or heavy metal toxicity as well as hemochromatosis. There is cholelithiasis without gallbladder wall thickening or biliary dilatation. No acute liver abnormality is seen without contrast. SPLEEN: No acute traumatic injury. PANCREAS: No acute traumatic injury. ADRENAL GLANDS: No adrenal hemorrhage is seen. No acute traumatic injury. KIDNEYS: No renal or perinephric hemorrhage is seen. There is chronic perinephric stranding. No urinary stone or obstruction. No contour deforming mass of the unenhanced kidneys is seen. No acute traumatic injury. No hydronephrosis. GI TRACT: There is no bowel obstruction or inflammation. The gastric wall is unremarkable. The appendix is not seen. There is advanced sigmoid diverticulosis without diverticulitis. Small fat-containing umbilical hernia. No acute traumatic injury of the bowel. PERITONEUM: No ascites or free air. No free fluid or free hemorrhage. RETROPERITONEUM: No retroperitoneal hematoma. BLADDER: The bladder is thickened but also not fully distended.  Correlate clinically for cystitis. REPRODUCTIVE ORGANS: There are dystrophic calcifications within a mildly enlarged prostate gland. BONES: No acute traumatic fracture of the pelvis. 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 No acute regional skeletal fracture at the levels of the abdomen and pelvis. SOFT TISSUES: There is increased skin thickening and subcutaneous stranding over the low anterior abdominal wall, which could be congestive or due to cellulitis. Mild anasarca is seen in portions of the remaining body wall. THORACIC AND LUMBAR SPINE: BONES AND ALIGNMENT: *   **Thoracic Spine:** * Acute oblique nondisplaced fracture through the mid to anterior T6 vertebral body extending through bridging enthesopathic bone, without evidence of fracture transmission into the posterior elements and pedicles. There is no loss of vertebral height at T6. * Broad-based mid to lower thoracic kyphosis centered at T10-T11, where there is again noted bulky bridging bone anteriorly between the segments, moderate wedging of both segments, with interbody ankylosis consistent with old injury, with old spinous process removal at both levels with dorsal bone graft fusion. . * The rest of the thoracic spine also has extensive bridging enthesopathy beginning at T2, resulting in multilevel interbody fusions. This  is eccentric to the right anterolateral aspect. There is ankylosis over multiple spinous processes. Facet joints are also fused at multiple levels with facet hypertrophy. * Above and below T10 and T11 the thoracic vertebrae are normal in height. There is osteopenia. *   **Lumbar Spine:** *   No acute fracture is evident. * Chronic grade 1 degenerative anterolisthesis at L4-L5 without other abnormal alignment. * Advanced lumbar marginal osteophytosis with bulky bridging osteophytes T12-L1, L1-L2, L2-L3, and L4-L5 with attempted bridging osteophytes at L3-L4 and L5-S1. * The discs are of normal height  apart from chronic disc space widening at L3-L4. Thank you *   Advanced facet hypertrophy most levels. * The spinous processes were previously removed at L2, L3, and L4 with spinous process shaving at L5. DEGENERATIVE CHANGES: * **Thoracic Spine:** Varying degrees of multilevel acquired foraminal stenosis, but this is most severe on the left at T1-T2, on the right at T4-T5, on the right greater than left at T10-T11, on the right at T11-T12. Posterior endplate ridging at the level of T10-T11 does flatten the ventral thecal sac and cause 7 mm thecal sac AP stenosis and probable mild flattening of the ventral cord without frank impression. Other levels do not show noteworthy soft tissue or bony encroachment on the thecal sac. *   **Lumbar Spine:** Acquired spinal stenosis is noted all levels except L1-L2, due to ligamentous facet hypertrophy and posterior endplate ridging, but it is most severe at L3-L4 and L4-L5. Foraminal stenosis is moderate to severe bilaterally from L2-L3 through L4-L5. SOFT TISSUES: * **Thoracic Spine:** Mild edema in the adjacent soft tissues at T6, but there is no appreciable spinal canal hematoma. No paraspinal mass or hematoma. *   **Lumbar Spine:** No paraspinal mass or hematoma. IMPRESSION: 1. Acute oblique nondisplaced mid to anterior T6 vertebral body fracture extending through bridging enthesopathic bone without posterior element involvement, no vertebral height loss, with mild adjacent soft tissue edema and no appreciable spinal canal hematoma. 2. Interval increased moderate-sized right pleural effusion demonstrating increased attenuation suggesting hemorrhagic or proteinaceous content; no pneumothorax. The rib cage is intact . 3. Right middle and lower lobe consolidation versus compressive atelectasis, with pneumonia and secondary empyema a consideration. 4. 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 5. Severe lumbar spinal stenosis at L3-L4 and L4-L5 with moderate to severe bilateral foraminal stenosis from L2-3 through L4-5. 6. Enlarged prostate. 7. Dense liver suggesting metallic or amiodarone  toxicity. 8. Cholelithiasis. 9. Cardiomegaly with aortic and coronary atherosclerosis. 10. Body wall edema, with increased skin thickening and stranding in the lower end of the ear canal which could represent cerumen or mastoid cellulitis. 11. Advanced sigmoid diverticulosis and additional detailed findings discussed above. Electronically signed by: Francis Quam MD 08/22/2024 05:00 AM EST RP Workstation: [id number]    Assessment/Plan: 73 year old male presented to the hospital after falling out of his wheelchair. 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 He has an acute oblique T6 vertebral body fracture which does not involve the posterior elements. I do think this will likely heal in a TLSO brace. No surgical intervention warranted at this time. Will follow up with Dr. Louis after discharge.   Suzen Lacks [facility] 08/22/2024 9:21 AM

## 2024-08-22 NOTE — Progress Notes (Signed)
 Orthopedic Tech Progress Note Patient Details:  Dean Ritzel Sr. 1950/12/26 [id number]  Ortho Devices Type of Ortho Device: Thoracolumbar corset (TLSO) Ortho Device/Splint Location: Back Ortho Device/Splint Interventions: Ordered, Adjustment   Post Interventions Patient Tolerated: Well  Dean [name] 08/22/2024, 9:06 AM

## 2024-08-22 NOTE — Procedures (Signed)
 PROCEDURE SUMMARY:  Successful image-guided diagnostic and therapeutic thoracentesis from the right chest.  Yielded 500 milliliters of bloody fluid. This finding was not necessarily unexpected given recent fall down 7 steps resulting in ejection from his wheelchair.  Vitals remained stable throughout. Ultrasound imaging showed catheter within pleural space throughout procedure.  No immediate complications.  EBL: zero Patient tolerated well.   Specimen sent for labs.  Post-procedure CXR ordered and reviewed by Dr. Philip prior to departure from department.   Please see imaging section of Epic for full dictation.  Anisia Leija NP 08/22/2024 12:32 PM

## 2024-08-23 DIAGNOSIS — T07XXXA Unspecified multiple injuries, initial encounter: Secondary | ICD-10-CM | POA: Diagnosis not present

## 2024-08-23 MED ORDER — METHOCARBAMOL 500 MG PO TABS
500.0000 mg | ORAL_TABLET | Freq: Four times a day (QID) | ORAL | Status: DC | PRN
  Administered 2024-08-23 – 2024-08-24 (×2): 500 mg via ORAL
  Filled 2024-08-23 (×2): qty 1

## 2024-08-23 MED ORDER — ACETAMINOPHEN 325 MG PO TABS
650.0000 mg | ORAL_TABLET | Freq: Four times a day (QID) | ORAL | Status: DC
  Administered 2024-08-23 – 2024-09-15 (×84): 650 mg via ORAL
  Filled 2024-08-23 (×84): qty 2

## 2024-08-23 MED ORDER — HYDROMORPHONE HCL 1 MG/ML IJ SOLN
0.5000 mg | INTRAMUSCULAR | Status: DC | PRN
  Administered 2024-08-29: 0.5 mg via INTRAVENOUS
  Filled 2024-08-23: qty 0.5

## 2024-08-23 MED ORDER — IRBESARTAN 150 MG PO TABS: 75.0000 mg | ORAL_TABLET | Freq: Every day | ORAL | Status: DC

## 2024-08-23 MED ORDER — OXYCODONE HCL 5 MG PO TABS
5.0000 mg | ORAL_TABLET | ORAL | Status: DC | PRN
  Administered 2024-08-23 – 2024-09-07 (×16): 5 mg via ORAL
  Filled 2024-08-23 (×16): qty 1

## 2024-08-23 MED ORDER — OXYCODONE HCL 5 MG PO TABS
5.0000 mg | ORAL_TABLET | Freq: Four times a day (QID) | ORAL | Status: DC | PRN
  Administered 2024-08-23: 5 mg via ORAL
  Filled 2024-08-23: qty 1

## 2024-08-23 NOTE — Progress Notes (Signed)
 Subjective/Chief Complaint: Feeling well today, he is off oxygen and reports he is breathing much easier   Objective: Vital signs in last 24 hours: Temp:  [97.6 F (36.4 C)-98.5 F (36.9 C)] 98.5 F (36.9 C) (11/29 0747) Pulse Rate:  [71-85] 85 (11/29 0747) Resp:  [18-21] 21 (11/29 0747) BP: (109-152)/(50-69) 129/53 (11/29 0747) SpO2:  [91 %-100 %] 100 % (11/29 0747) Last BM Date : 08/22/24  Intake/Output from previous day: 11/28 0701 - 11/29 0700 In: -  Out: 400 [Urine:400] Intake/Output this shift: Total I/O In: 240 [P.O.:240] Out: -   Alert, well-appearing Unlabored respirations TLSO brace in place Chronic lower extremity edema  Lab Results:  Recent Labs    08/22/24 0418 08/22/24 0903  WBC 6.8 8.4  HGB 9.0* 9.0*  HCT 28.1* 28.3*  PLT 142* 156   BMET Recent Labs    08/22/24 0418 08/22/24 0903  NA 135  --   K 4.1  --   CL 103  --   CO2 19*  --   GLUCOSE 105*  --   BUN 44*  --   CREATININE 2.83* 2.89*  CALCIUM  9.5  --    PT/INR Recent Labs    08/22/24 0418  LABPROT 20.1*  INR 1.6*   ABG No results for input(s): PHART, HCO3 in the last 72 hours.  Invalid input(s): PCO2, PO2  Studies/Results: CT CHEST WO CONTRAST Result Date: 08/22/2024 CLINICAL DATA:  Status post fall with right pleural effusion and shortness of breath EXAM: CT CHEST WITHOUT CONTRAST TECHNIQUE: Multidetector CT imaging of the chest was performed following the standard protocol without IV contrast. RADIATION DOSE REDUCTION: This exam was performed according to the departmental dose-optimization program which includes automated exposure control, adjustment of the mA and/or kV according to patient size and/or use of iterative reconstruction technique. COMPARISON:  Earlier same day CT chest FINDINGS: Cardiovascular: Left chest wall ICD lead terminates in the right ventricle. Multichamber cardiomegaly. No significant pericardial fluid/thickening. Dilated main pulmonary  artery measures 3.8 cm. Coronary artery calcifications and aortic atherosclerosis. Mediastinum/Nodes: Imaged thyroid gland without nodules meeting criteria for imaging follow-up by size. Mildly patulous esophagus. Left supraclavicular lymph node measures 10 mm (3:19). Unchanged prominent multi station mediastinal lymph nodes. Lungs/Pleura: The central airways are patent. Diffuse mosaic attenuation. Essentially unchanged appearance compared to earlier same day CT chest. Similar atelectasis/consolidation involving the right upper, middle, and lower lobes. No pneumothorax. Unchanged large, mildly hyperattenuating right pleural effusion. Upper abdomen: Please see separately dictated earlier same day CT abdomen and pelvis report for detailed findings. Musculoskeletal: Thoracic spine fracture, better evaluated on same-day CT thoracic spine. Multilevel degenerative changes of the thoracic spine. Flowing anterior osteophytes over at least 4 contiguous thoracic vertebrae which can be seen in the setting of diffuse idiopathic skeletal hyperostosis (DISH). Unchanged wedging of T10 and T11. IMPRESSION: 1. Essentially unchanged appearance when compared to earlier same day chest CT. 2. Unchanged large, mildly hyperattenuating right pleural effusion, in keeping with hemothorax noted on subsequent thoracentesis. 3. Similar atelectasis/consolidation involving the right upper, middle, and lower lobes. 4. Diffuse mosaic attenuation, which can be seen in the setting of small airways disease. 5. Dilated main pulmonary artery, which can be seen in the setting of pulmonary arterial hypertension. 6. Unchanged prominent multi station mediastinal lymph nodes and left supraclavicular lymph node, likely reactive. 7.  Aortic Atherosclerosis (ICD10-I70.0). Electronically Signed   By: Limin  Xu M.D.   On: 08/22/2024 16:47   DG Chest 1 View Result Date: 08/22/2024 CLINICAL  DATA:  Status post right thoracentesis. EXAM: CHEST  1 VIEW COMPARISON:   None Available. FINDINGS: Stable mild cardiomegaly.  AICD remains in place. Moderate right pleural effusion and right lower lung atelectasis or infiltrate is seen. No pneumothorax visualized. IMPRESSION: No pneumothorax visualized. Moderate right pleural effusion and right lower lung atelectasis or infiltrate. Electronically Signed   By: Norleen DELENA Kil M.D.   On: 08/22/2024 12:56   CT L-SPINE NO CHARGE Result Date: 08/22/2024 EXAM: CT CHEST ABDOMEN PELVIS WITH THORACIC AND LUMBAR SPINE RECONSTRUCTIONS 08/22/2024 03:44:00 AM TECHNIQUE: CT of the chest, abdomen, pelvis was performed without the administration of intravenous contrast. Multiplanar reformatted images are provided for review, including reconstructed images of the thoracic and lumbar spine. Automated exposure control, iterative reconstruction, and/or weight based adjustment of the mA/kV was utilized to reduce the radiation dose to as low as reasonably achievable. COMPARISON: Portable chest 07/10/2024, chest CT without contrast 07/10/2024, chest CT high resolution study 09/08/2019, abdomen and pelvis CT without contrast 05/04/2024, abdomen and pelvis CT without contrast 04/01/2024. CLINICAL HISTORY: Polytrauma, blunt. Patient fell down an unknown number of steps from his wheelchair. FINDINGS: CT CHEST: THORACIC AORTA: The aorta and great vessels are moderate to heavily calcified. No acute traumatic injury of the aorta. MEDIASTINUM: Single lead left chest pacemaker/aid, stable right ventricular wire insertion. The heart is moderately enlarged. No pericardial effusion. Left main and patchy 3-vessel coronary calcifications. Mild central pulmonary venous distention again noted with prominent pulmonary trunk measuring 3.4 cm, indicating arterial hypertension. A 1.2 cm precarinal lymph node to the right is again noted with similar sized left-sided prevascular nodes also again seen. Patulous esophagus with normal wall thickness. The trachea and main bronchi are  patent. There is mild diffuse bronchial thickening. No mediastinal hematoma or pneumomediastinum. No acute traumatic injury to the heart or pericardium. LUNGS: Interval increased moderate sized right pleural effusion. Most of this does layer posteriorly, but some of it could be loculated in the chest base anteriorly. The density of the right pleural effusion is greater than simple fluid, measuring up to 33 Hounsfield units. A component of pleural hemorrhage or proteinaceous fluid such as due to infection or empyema, is not excluded. There is no air in the pleural fluid and no pneumothorax. There is consolidation versus compressive collapse over a majority of the right middle and lower lobes. Underlying pneumonia is a consideration. Nodules previously newly identified in the base of the right upper lobe and in the right lower lobe, would be obscured by the pleural fluid if still present. Mosaic attenuation of the lungs continues to be seen, consistent with air trapping and small airway disease. The lungs are otherwise clear. No acute traumatic injury to the lungs. No pulmonary contusion or laceration. There previously was a small left pleural effusion, which has cleared. CHEST WALL: No acute displaced rib fracture. No chest wall hematoma. No chest wall mass. CT ABDOMEN AND PELVIS: ABDOMINAL AORTA: Heavy aortic and branch vessel atherosclerosis without aneurysm. No acute traumatic injury of the aorta or iliac arteries. HEPATOBILIARY: Hypoattenuating lesions in the dome of the liver, measuring 4.6 x 3.9 x 2 cm in segments 8 and 2 have been previously characterized as hemangiomas on a study of 09/19/2018. There have been no significant interval changes. The liver is dense, which could be seen with amiodarone  or heavy metal toxicity as well as hemochromatosis. There is cholelithiasis without gallbladder wall thickening or biliary dilatation. No acute liver abnormality is seen without contrast. SPLEEN: No acute traumatic  injury.  PANCREAS: No acute traumatic injury. ADRENAL GLANDS: No adrenal hemorrhage is seen. No acute traumatic injury. KIDNEYS: No renal or perinephric hemorrhage is seen. There is chronic perinephric stranding. No urinary stone or obstruction. No contour deforming mass of the unenhanced kidneys is seen. No acute traumatic injury. No hydronephrosis. GI TRACT: There is no bowel obstruction or inflammation. The gastric wall is unremarkable. The appendix is not seen. There is advanced sigmoid diverticulosis without diverticulitis. Small fat-containing umbilical hernia. No acute traumatic injury of the bowel. PERITONEUM: No ascites or free air. No free fluid or free hemorrhage. RETROPERITONEUM: No retroperitoneal hematoma. BLADDER: The bladder is thickened but also not fully distended. Correlate clinically for cystitis. REPRODUCTIVE ORGANS: There are dystrophic calcifications within a mildly enlarged prostate gland. BONES: No acute traumatic fracture of the pelvis. 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 No acute regional skeletal fracture at the levels of the abdomen and pelvis. SOFT TISSUES: There is increased skin thickening and subcutaneous stranding over the low anterior abdominal wall, which could be congestive or due to cellulitis. Mild anasarca is seen in portions of the remaining body wall. THORACIC AND LUMBAR SPINE: BONES AND ALIGNMENT: *   **Thoracic Spine:** * Acute oblique nondisplaced fracture through the mid to anterior T6 vertebral body extending through bridging enthesopathic bone, without evidence of fracture transmission into the posterior elements and pedicles. There is no loss of vertebral height at T6. * Broad-based mid to lower thoracic kyphosis centered at T10-T11, where there is again noted bulky bridging bone anteriorly between the segments, moderate wedging of both segments, with interbody ankylosis consistent with old injury, with old spinous process removal at  both levels with dorsal bone graft fusion. . * The rest of the thoracic spine also has extensive bridging enthesopathy beginning at T2, resulting in multilevel interbody fusions. This is eccentric to the right anterolateral aspect. There is ankylosis over multiple spinous processes. Facet joints are also fused at multiple levels with facet hypertrophy. * Above and below T10 and T11 the thoracic vertebrae are normal in height. There is osteopenia. *   **Lumbar Spine:** *   No acute fracture is evident. * Chronic grade 1 degenerative anterolisthesis at L4-L5 without other abnormal alignment. * Advanced lumbar marginal osteophytosis with bulky bridging osteophytes T12-L1, L1-L2, L2-L3, and L4-L5 with attempted bridging osteophytes at L3-L4 and L5-S1. * The discs are of normal height apart from chronic disc space widening at L3-L4. Thank you *   Advanced facet hypertrophy most levels. * The spinous processes were previously removed at L2, L3, and L4 with spinous process shaving at L5. DEGENERATIVE CHANGES: * **Thoracic Spine:** Varying degrees of multilevel acquired foraminal stenosis, but this is most severe on the left at T1-T2, on the right at T4-T5, on the right greater than left at T10-T11, on the right at T11-T12. Posterior endplate ridging at the level of T10-T11 does flatten the ventral thecal sac and cause 7 mm thecal sac AP stenosis and probable mild flattening of the ventral cord without frank impression. Other levels do not show noteworthy soft tissue or bony encroachment on the thecal sac. *   **Lumbar Spine:** Acquired spinal stenosis is noted all levels except L1-L2, due to ligamentous facet hypertrophy and posterior endplate ridging, but it is most severe at L3-L4 and L4-L5. Foraminal stenosis is moderate to severe bilaterally from L2-L3 through L4-L5. SOFT TISSUES: * **Thoracic Spine:** Mild edema in the adjacent soft tissues at T6, but there is no appreciable spinal canal  hematoma. No paraspinal mass  or hematoma. *   **Lumbar Spine:** No paraspinal mass or hematoma. IMPRESSION: 1. Acute oblique nondisplaced mid to anterior T6 vertebral body fracture extending through bridging enthesopathic bone without posterior element involvement, no vertebral height loss, with mild adjacent soft tissue edema and no appreciable spinal canal hematoma. 2. Interval increased moderate-sized right pleural effusion demonstrating increased attenuation suggesting hemorrhagic or proteinaceous content; no pneumothorax. The rib cage is intact . 3. Right middle and lower lobe consolidation versus compressive atelectasis, with pneumonia and secondary empyema a consideration. 4. 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 5. Severe lumbar spinal stenosis at L3-L4 and L4-L5 with moderate to severe bilateral foraminal stenosis from L2-3 through L4-5. 6. Enlarged prostate. 7. Dense liver suggesting metallic or amiodarone  toxicity. 8. Cholelithiasis. 9. Cardiomegaly with aortic and coronary atherosclerosis. 10. Body wall edema, with increased skin thickening and stranding in the lower end of the ear canal which could represent cerumen or mastoid cellulitis. 11. Advanced sigmoid diverticulosis and additional detailed findings discussed above. Electronically signed by: Francis Quam MD 08/22/2024 05:00 AM EST RP Workstation: [id number]   CT T-SPINE NO CHARGE Result Date: 08/22/2024 EXAM: CT CHEST ABDOMEN PELVIS WITH THORACIC AND LUMBAR SPINE RECONSTRUCTIONS 08/22/2024 03:44:00 AM TECHNIQUE: CT of the chest, abdomen, pelvis was performed without the administration of intravenous contrast. Multiplanar reformatted images are provided for review, including reconstructed images of the thoracic and lumbar spine. Automated exposure control,  iterative reconstruction, and/or weight based adjustment of the mA/kV was utilized to reduce the radiation dose to as low as reasonably achievable. COMPARISON: Portable chest 07/10/2024, chest CT without contrast 07/10/2024, chest CT high resolution study 09/08/2019, abdomen and pelvis CT without contrast 05/04/2024, abdomen and pelvis CT without contrast 04/01/2024. CLINICAL HISTORY: Polytrauma, blunt. Patient fell down an unknown number of steps from his wheelchair. FINDINGS: CT CHEST: THORACIC AORTA: The aorta and great vessels are moderate to heavily calcified. No acute traumatic injury of the aorta. MEDIASTINUM: Single lead left chest pacemaker/aid, stable right ventricular wire insertion. The heart is moderately enlarged. No pericardial effusion. Left main and patchy 3-vessel coronary calcifications. Mild central pulmonary venous distention again noted with prominent pulmonary trunk measuring 3.4 cm, indicating arterial hypertension. A 1.2 cm precarinal lymph node to the right is again noted with similar sized left-sided prevascular nodes also again seen. Patulous esophagus with normal wall thickness. The trachea and main bronchi are patent. There is mild diffuse bronchial thickening. No mediastinal hematoma or pneumomediastinum. No acute traumatic injury to the heart or pericardium. LUNGS: Interval increased moderate sized right pleural effusion. Most of this does layer posteriorly, but some of it could be loculated in the chest base anteriorly. The density of the right pleural effusion is greater than simple fluid, measuring up to 33 Hounsfield units. A component of pleural hemorrhage or proteinaceous fluid such as due to infection or empyema, is not excluded. There is no air in the pleural fluid and no pneumothorax. There is consolidation versus compressive collapse over a majority of the right middle and lower lobes. Underlying pneumonia is a consideration. Nodules previously newly identified in the base  of the right upper lobe and in the right lower lobe, would be obscured by the pleural fluid if still present. Mosaic attenuation of the lungs continues to be seen, consistent with air trapping and small airway disease.  The lungs are otherwise clear. No acute traumatic injury to the lungs. No pulmonary contusion or laceration. There previously was a small left pleural effusion, which has cleared. CHEST WALL: No acute displaced rib fracture. No chest wall hematoma. No chest wall mass. CT ABDOMEN AND PELVIS: ABDOMINAL AORTA: Heavy aortic and branch vessel atherosclerosis without aneurysm. No acute traumatic injury of the aorta or iliac arteries. HEPATOBILIARY: Hypoattenuating lesions in the dome of the liver, measuring 4.6 x 3.9 x 2 cm in segments 8 and 2 have been previously characterized as hemangiomas on a study of 09/19/2018. There have been no significant interval changes. The liver is dense, which could be seen with amiodarone  or heavy metal toxicity as well as hemochromatosis. There is cholelithiasis without gallbladder wall thickening or biliary dilatation. No acute liver abnormality is seen without contrast. SPLEEN: No acute traumatic injury. PANCREAS: No acute traumatic injury. ADRENAL GLANDS: No adrenal hemorrhage is seen. No acute traumatic injury. KIDNEYS: No renal or perinephric hemorrhage is seen. There is chronic perinephric stranding. No urinary stone or obstruction. No contour deforming mass of the unenhanced kidneys is seen. No acute traumatic injury. No hydronephrosis. GI TRACT: There is no bowel obstruction or inflammation. The gastric wall is unremarkable. The appendix is not seen. There is advanced sigmoid diverticulosis without diverticulitis. Small fat-containing umbilical hernia. No acute traumatic injury of the bowel. PERITONEUM: No ascites or free air. No free fluid or free hemorrhage. RETROPERITONEUM: No retroperitoneal hematoma. BLADDER: The bladder is thickened but also not fully  distended. Correlate clinically for cystitis. REPRODUCTIVE ORGANS: There are dystrophic calcifications within a mildly enlarged prostate gland. BONES: No acute traumatic fracture of the pelvis. 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 No acute regional skeletal fracture at the levels of the abdomen and pelvis. SOFT TISSUES: There is increased skin thickening and subcutaneous stranding over the low anterior abdominal wall, which could be congestive or due to cellulitis. Mild anasarca is seen in portions of the remaining body wall. THORACIC AND LUMBAR SPINE: BONES AND ALIGNMENT: *   **Thoracic Spine:** * Acute oblique nondisplaced fracture through the mid to anterior T6 vertebral body extending through bridging enthesopathic bone, without evidence of fracture transmission into the posterior elements and pedicles. There is no loss of vertebral height at T6. * Broad-based mid to lower thoracic kyphosis centered at T10-T11, where there is again noted bulky bridging bone anteriorly between the segments, moderate wedging of both segments, with interbody ankylosis consistent with old injury, with old spinous process removal at both levels with dorsal bone graft fusion. . * The rest of the thoracic spine also has extensive bridging enthesopathy beginning at T2, resulting in multilevel interbody fusions. This is eccentric to the right anterolateral aspect. There is ankylosis over multiple spinous processes. Facet joints are also fused at multiple levels with facet hypertrophy. * Above and below T10 and T11 the thoracic vertebrae are normal in height. There is osteopenia. *   **Lumbar Spine:** *   No acute fracture is evident. * Chronic grade 1 degenerative anterolisthesis at L4-L5 without other abnormal alignment. * Advanced lumbar marginal osteophytosis with bulky bridging osteophytes T12-L1, L1-L2, L2-L3, and L4-L5 with attempted bridging osteophytes at L3-L4 and L5-S1. * The discs are of normal  height apart from chronic disc space widening at L3-L4. Thank you *   Advanced facet hypertrophy most levels. * The spinous processes were previously removed at L2, L3, and L4 with spinous process shaving at L5. DEGENERATIVE CHANGES: * **Thoracic  Spine:** Varying degrees of multilevel acquired foraminal stenosis, but this is most severe on the left at T1-T2, on the right at T4-T5, on the right greater than left at T10-T11, on the right at T11-T12. Posterior endplate ridging at the level of T10-T11 does flatten the ventral thecal sac and cause 7 mm thecal sac AP stenosis and probable mild flattening of the ventral cord without frank impression. Other levels do not show noteworthy soft tissue or bony encroachment on the thecal sac. *   **Lumbar Spine:** Acquired spinal stenosis is noted all levels except L1-L2, due to ligamentous facet hypertrophy and posterior endplate ridging, but it is most severe at L3-L4 and L4-L5. Foraminal stenosis is moderate to severe bilaterally from L2-L3 through L4-L5. SOFT TISSUES: * **Thoracic Spine:** Mild edema in the adjacent soft tissues at T6, but there is no appreciable spinal canal hematoma. No paraspinal mass or hematoma. *   **Lumbar Spine:** No paraspinal mass or hematoma. IMPRESSION: 1. Acute oblique nondisplaced mid to anterior T6 vertebral body fracture extending through bridging enthesopathic bone without posterior element involvement, no vertebral height loss, with mild adjacent soft tissue edema and no appreciable spinal canal hematoma. 2. Interval increased moderate-sized right pleural effusion demonstrating increased attenuation suggesting hemorrhagic or proteinaceous content; no pneumothorax. The rib cage is intact . 3. Right middle and lower lobe consolidation versus compressive atelectasis, with pneumonia and secondary empyema a consideration. 4. 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 5. Severe lumbar spinal stenosis at L3-L4 and L4-L5 with moderate to severe bilateral foraminal stenosis from L2-3 through L4-5. 6. Enlarged prostate. 7. Dense liver suggesting metallic or amiodarone  toxicity. 8. Cholelithiasis. 9. Cardiomegaly with aortic and coronary atherosclerosis. 10. Body wall edema, with increased skin thickening and stranding in the lower end of the ear canal which could represent cerumen or mastoid cellulitis. 11. Advanced sigmoid diverticulosis and additional detailed findings discussed above. Electronically signed by: Francis Quam MD 08/22/2024 05:00 AM EST RP Workstation: [id number]   CT CHEST ABDOMEN PELVIS WO CONTRAST Result Date: 08/22/2024 EXAM: CT CHEST ABDOMEN PELVIS WITH THORACIC AND LUMBAR SPINE RECONSTRUCTIONS 08/22/2024 03:44:00 AM TECHNIQUE: CT of the chest, abdomen, pelvis was performed without the administration of intravenous contrast. Multiplanar reformatted images are provided for review, including reconstructed images of the thoracic and lumbar spine. Automated exposure control, iterative reconstruction, and/or weight based adjustment of the mA/kV was utilized to reduce the radiation dose to as low as reasonably achievable. COMPARISON: Portable chest 07/10/2024, chest CT without contrast 07/10/2024, chest CT high resolution study 09/08/2019, abdomen and pelvis CT without contrast 05/04/2024, abdomen and pelvis CT without contrast 04/01/2024. CLINICAL HISTORY: Polytrauma, blunt. Patient fell down an unknown number of steps from his wheelchair. FINDINGS: CT CHEST: THORACIC AORTA: The aorta and great vessels are moderate to heavily calcified. No acute traumatic injury of the aorta. MEDIASTINUM: Single lead left chest pacemaker/aid, stable right ventricular wire insertion. The heart is moderately enlarged. No pericardial effusion. Left  main and patchy 3-vessel coronary calcifications. Mild central pulmonary venous distention again noted with prominent pulmonary trunk measuring 3.4 cm, indicating arterial hypertension. A 1.2 cm precarinal lymph node to the right is again noted with similar sized left-sided prevascular nodes also again seen. Patulous esophagus with normal wall thickness. The trachea and main bronchi are patent. There is mild diffuse bronchial thickening. No mediastinal hematoma or  pneumomediastinum. No acute traumatic injury to the heart or pericardium. LUNGS: Interval increased moderate sized right pleural effusion. Most of this does layer posteriorly, but some of it could be loculated in the chest base anteriorly. The density of the right pleural effusion is greater than simple fluid, measuring up to 33 Hounsfield units. A component of pleural hemorrhage or proteinaceous fluid such as due to infection or empyema, is not excluded. There is no air in the pleural fluid and no pneumothorax. There is consolidation versus compressive collapse over a majority of the right middle and lower lobes. Underlying pneumonia is a consideration. Nodules previously newly identified in the base of the right upper lobe and in the right lower lobe, would be obscured by the pleural fluid if still present. Mosaic attenuation of the lungs continues to be seen, consistent with air trapping and small airway disease. The lungs are otherwise clear. No acute traumatic injury to the lungs. No pulmonary contusion or laceration. There previously was a small left pleural effusion, which has cleared. CHEST WALL: No acute displaced rib fracture. No chest wall hematoma. No chest wall mass. CT ABDOMEN AND PELVIS: ABDOMINAL AORTA: Heavy aortic and branch vessel atherosclerosis without aneurysm. No acute traumatic injury of the aorta or iliac arteries. HEPATOBILIARY: Hypoattenuating lesions in the dome of the liver, measuring 4.6 x 3.9 x 2 cm in segments 8 and 2 have  been previously characterized as hemangiomas on a study of 09/19/2018. There have been no significant interval changes. The liver is dense, which could be seen with amiodarone  or heavy metal toxicity as well as hemochromatosis. There is cholelithiasis without gallbladder wall thickening or biliary dilatation. No acute liver abnormality is seen without contrast. SPLEEN: No acute traumatic injury. PANCREAS: No acute traumatic injury. ADRENAL GLANDS: No adrenal hemorrhage is seen. No acute traumatic injury. KIDNEYS: No renal or perinephric hemorrhage is seen. There is chronic perinephric stranding. No urinary stone or obstruction. No contour deforming mass of the unenhanced kidneys is seen. No acute traumatic injury. No hydronephrosis. GI TRACT: There is no bowel obstruction or inflammation. The gastric wall is unremarkable. The appendix is not seen. There is advanced sigmoid diverticulosis without diverticulitis. Small fat-containing umbilical hernia. No acute traumatic injury of the bowel. PERITONEUM: No ascites or free air. No free fluid or free hemorrhage. RETROPERITONEUM: No retroperitoneal hematoma. BLADDER: The bladder is thickened but also not fully distended. Correlate clinically for cystitis. REPRODUCTIVE ORGANS: There are dystrophic calcifications within a mildly enlarged prostate gland. BONES: No acute traumatic fracture of the pelvis. 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 No acute regional skeletal fracture at the levels of the abdomen and pelvis. SOFT TISSUES: There is increased skin thickening and subcutaneous stranding over the low anterior abdominal wall, which could be congestive or due to cellulitis. Mild anasarca is seen in portions of the remaining body wall. THORACIC AND LUMBAR SPINE: BONES AND ALIGNMENT: *   **Thoracic Spine:** * Acute oblique nondisplaced fracture through the mid to anterior T6 vertebral body extending through bridging enthesopathic bone, without  evidence of fracture transmission into the posterior elements and pedicles. There is no loss of vertebral height at T6. * Broad-based mid to lower thoracic kyphosis centered at T10-T11, where there is again noted bulky bridging bone anteriorly between the segments, moderate wedging of both segments, with interbody ankylosis consistent with old injury, with old spinous process removal at both levels with dorsal bone graft fusion. . * The rest of the thoracic spine also  has extensive bridging enthesopathy beginning at T2, resulting in multilevel interbody fusions. This is eccentric to the right anterolateral aspect. There is ankylosis over multiple spinous processes. Facet joints are also fused at multiple levels with facet hypertrophy. * Above and below T10 and T11 the thoracic vertebrae are normal in height. There is osteopenia. *   **Lumbar Spine:** *   No acute fracture is evident. * Chronic grade 1 degenerative anterolisthesis at L4-L5 without other abnormal alignment. * Advanced lumbar marginal osteophytosis with bulky bridging osteophytes T12-L1, L1-L2, L2-L3, and L4-L5 with attempted bridging osteophytes at L3-L4 and L5-S1. * The discs are of normal height apart from chronic disc space widening at L3-L4. Thank you *   Advanced facet hypertrophy most levels. * The spinous processes were previously removed at L2, L3, and L4 with spinous process shaving at L5. DEGENERATIVE CHANGES: * **Thoracic Spine:** Varying degrees of multilevel acquired foraminal stenosis, but this is most severe on the left at T1-T2, on the right at T4-T5, on the right greater than left at T10-T11, on the right at T11-T12. Posterior endplate ridging at the level of T10-T11 does flatten the ventral thecal sac and cause 7 mm thecal sac AP stenosis and probable mild flattening of the ventral cord without frank impression. Other levels do not show noteworthy soft tissue or bony encroachment on the thecal sac. *   **Lumbar Spine:** Acquired  spinal stenosis is noted all levels except L1-L2, due to ligamentous facet hypertrophy and posterior endplate ridging, but it is most severe at L3-L4 and L4-L5. Foraminal stenosis is moderate to severe bilaterally from L2-L3 through L4-L5. SOFT TISSUES: * **Thoracic Spine:** Mild edema in the adjacent soft tissues at T6, but there is no appreciable spinal canal hematoma. No paraspinal mass or hematoma. *   **Lumbar Spine:** No paraspinal mass or hematoma. IMPRESSION: 1. Acute oblique nondisplaced mid to anterior T6 vertebral body fracture extending through bridging enthesopathic bone without posterior element involvement, no vertebral height loss, with mild adjacent soft tissue edema and no appreciable spinal canal hematoma. 2. Interval increased moderate-sized right pleural effusion demonstrating increased attenuation suggesting hemorrhagic or proteinaceous content; no pneumothorax. The rib cage is intact . 3. Right middle and lower lobe consolidation versus compressive atelectasis, with pneumonia and secondary empyema a consideration. 4. 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 5. Severe lumbar spinal stenosis at L3-L4 and L4-L5 with moderate to severe bilateral foraminal stenosis from L2-3 through L4-5. 6. Enlarged prostate. 7. Dense liver suggesting metallic or amiodarone  toxicity. 8. Cholelithiasis. 9. Cardiomegaly with aortic and coronary atherosclerosis. 10. Body wall edema, with increased skin thickening and stranding in the lower end of the ear canal which could represent cerumen or mastoid cellulitis. 11. Advanced sigmoid diverticulosis and additional detailed findings discussed above. Electronically signed by: Francis Quam MD 08/22/2024 05:00 AM EST RP Workstation: [id number]     Anti-infectives: Anti-infectives (From admission, onward)    None       Assessment/Plan: A/P: 73 year old male with multiple medical problems who sustained a fall a few days ago   -T6 vb fx per NSG, TLSO brace - Moderate right pleural effusion (does have small bilateral pleural effusions on CT in mid October) without rib fx, with atelectasis - new oxygen requirement and SOB when lying down. S/p thoracentesis 11/28 with bloody output.  Postprocedural x-ray yesterday with  moderate right effusion and right lower lobe atelectasis versus infiltrate;  check repeat CXR in AM.  -Aggressive pulmonary toilet, mobilize as able     -multilevel foraminal/spinal stenoses/ severe degenerative disease  -dense liver suggesting metallic or amiodarone  toxicity -Caridomegaly and aortic/acoronary atherosclerosis -Sigmoid diverticulosis   LOS: 1 day    Dean [name] 08/23/2024

## 2024-08-23 NOTE — Progress Notes (Signed)
 Transition of Care (TOC) - CAGE-AID Screening   Patient Details  Name: Dean Timbrook Sr. MRN: [id number] Date of Birth: 1950/12/15  Transition of Care [facility]) CM/SW Contact:    Bernardino Mayotte, RN Phone Number: 08/23/2024, 6:09 AM   Clinical Narrative:  Denies use of alcohol and illicit substances.  CAGE-AID Screening:    Have You Ever Felt You Ought to Cut Down on Your Drinking or Drug Use?: No Have People Annoyed You By Critizing Your Drinking Or Drug Use?: No Have You Felt Bad Or Guilty About Your Drinking Or Drug Use?: No Have You Ever Had a Drink or Used Drugs First Thing In The Morning to Steady Your Nerves or to Get Rid of a Hangover?: No CAGE-AID Score: 0  Substance Abuse Education Offered: No

## 2024-08-23 NOTE — Progress Notes (Signed)
 NEUROSURGERY PROGRESS NOTE  Doing well. Complains of appropriate back soreness. No leg pain No numbness, tingling or weakness Ambulating and voiding well Good strength and sensation  Temp:  [97.6 F (36.4 C)-98.5 F (36.9 C)] 98.5 F (36.9 C) (11/29 0747) Pulse Rate:  [71-85] 85 (11/29 0747) Resp:  [18-21] 21 (11/29 0747) BP: (109-152)/(49-69) 129/53 (11/29 0747) SpO2:  [91 %-100 %] 100 % (11/29 0747)  Plan: Continue pain control, brace when OOB.  Dean Chiquita Pean, NP 08/23/2024 8:12 AM

## 2024-08-23 NOTE — Progress Notes (Signed)
 Physical Therapy Treatment Patient Details Name: Dean Kushnir Sr. MRN: [id number] DOB: [birth year] Today's Date: 08/23/2024   History of Present Illness 73 yo male s/p fall down 8 stairs in wheelchair. CT (+) T6 vertebral body fx, neurosurgery states no plan for surgery but needs TLSO brace. S/p R thoracentesis 11/28 yielding 500 mL bloody fluid. PMH AICD, arthritis, HLD, HFrEF, ICD, PAF on Eliquis , HTN, CAD, HLD, OSA on cpap, T2DM, CKD, HTN, laminectomy, cardiac arrest (2017).    PT Comments  Pt received in chair with TLSO on. Session done with pt on RA. SPO2 remained 90-92% most of the time with one episode of 87%. Pt with 2/4 DOE. He reports that SOB new to this admission. Pt ambulated 150' with RW and practiced 2 steps with rail and HHA. Pt given IS and achieved 700. SPO2 97% after 10 breaths. Continue to recommend HHPT at d/c. PT will continue to follow.     If plan is discharge home, recommend the following: A little help with walking and/or transfers;A little help with bathing/dressing/bathroom   Can travel by private vehicle        Equipment Recommendations  Rolling walker (2 wheels)    Recommendations for Other Services       Precautions / Restrictions Precautions Precautions: Fall;Back Recall of Precautions/Restrictions: Intact Required Braces or Orthoses: Spinal Brace Spinal Brace: Thoracolumbosacral orthotic Restrictions Weight Bearing Restrictions Per Provider Order: No     Mobility  Bed Mobility               General bed mobility comments: received in recliner with brace on    Transfers Overall transfer level: Needs assistance Equipment used: Rolling walker (2 wheels) Transfers: Sit to/from Stand Sit to Stand: Contact guard assist           General transfer comment: practiced from recliner as well as smaller chair. Pt with good recall of hand placement. Slow mvmt but no physical assist needed    Ambulation/Gait Ambulation/Gait assistance:  Contact guard assist Gait Distance (Feet): 150 Feet Assistive device: Rolling walker (2 wheels) Gait Pattern/deviations: Step-through pattern, Decreased stride length, Trunk flexed Gait velocity: decr Gait velocity interpretation: <1.8 ft/sec, indicate of risk for recurrent falls   General Gait Details: cues for proximity to RW, upright posture   Stairs Stairs: Yes Stairs assistance: Min assist Stair Management: One rail Right, Step to pattern, Forwards Number of Stairs: 2 General stair comments: rail on R, HHA on L (pt can reach both rails at home)   Wheelchair Mobility     Tilt Bed    Modified Rankin (Stroke Patients Only)       Balance Overall balance assessment: Needs assistance Sitting-balance support: No upper extremity supported, Feet supported Sitting balance-Leahy Scale: Fair     Standing balance support: Bilateral upper extremity supported Standing balance-Leahy Scale: Fair Standing balance comment: able to let go of RW and maintain balance while he adjusts brace before walking                            Communication Communication Communication: No apparent difficulties Factors Affecting Communication:  (slow processing)  Cognition Arousal: Alert Behavior During Therapy: WFL for tasks assessed/performed                           PT - Cognition Comments: showed good understanding of instructions today Following commands: Impaired Following commands impaired: Follows one step commands with  increased time    Cueing Cueing Techniques: Verbal cues  Exercises      General Comments General comments (skin integrity, edema, etc.): Pt on RA throughout session with SPO2 low 90's 91-92% while ambulating with one episode of 87%. Gave IS and instructed in use. Pt able to reach only 700. SPO2 97% after use.      Pertinent Vitals/Pain Pain Assessment Pain Assessment: Faces Faces Pain Scale: Hurts little more Pain Location: R side  back Pain Descriptors / Indicators: Grimacing, Guarding, Discomfort Pain Intervention(s): Limited activity within patient's tolerance, Monitored during session    Home Living                          Prior Function            PT Goals (current goals can now be found in the care plan section) Acute Rehab PT Goals Patient Stated Goal: return home PT Goal Formulation: With patient Time For Goal Achievement: 09/05/24 Potential to Achieve Goals: Good Progress towards PT goals: Progressing toward goals    Frequency    Min 2X/week      PT Plan      Co-evaluation              AM-PAC PT 6 Clicks Mobility   Outcome Measure  Help needed turning from your back to your side while in a flat bed without using bedrails?: A Little Help needed moving from lying on your back to sitting on the side of a flat bed without using bedrails?: A Little Help needed moving to and from a bed to a chair (including a wheelchair)?: A Little Help needed standing up from a chair using your arms (e.g., wheelchair or bedside chair)?: A Little Help needed to walk in hospital room?: A Little Help needed climbing 3-5 steps with a railing? : A Little 6 Click Score: 18    End of Session Equipment Utilized During Treatment: Gait belt;Back brace Activity Tolerance: Patient tolerated treatment well Patient left: in chair;with call bell/phone within reach;with chair alarm set Nurse Communication: Mobility status PT Visit Diagnosis: Other abnormalities of gait and mobility (R26.89);Muscle weakness (generalized) (M62.81)     Time: 9066-8995 PT Time Calculation (min) (ACUTE ONLY): 31 min  Charges:    $Gait Training: 23-37 mins PT General Charges $$ ACUTE PT VISIT: 1 Visit                     Richerd Lipoma, PT  Acute Rehab Services Secure chat preferred Office [phone]    Richerd CROME Staphanie Harbison 08/23/2024, 10:36 AM

## 2024-08-23 NOTE — Progress Notes (Signed)
 PROGRESS NOTE    Dean Croft Sr.  FMW:[id number] DOB: Jan 21, 1951 DOA: 08/22/2024 PCP: [name], MD    Brief Narrative:  61M h/o CAD, PAF, ICM, cardiac arrest s/p AICD, HFrEF (EF 35-40% in 04/2024), HTN, HLD, DM2, OSA, and last admission in 06/2204 for HFrEF exacerbation who p/w back pain s/p GLF from wheelchair and found to have AKI and acute T6 fracture.    Assessment and Plan: Acute T6 fracture -NSGY consulted; apprec eval/recs (no surgery indicated; TLSO and OP f/u w/ Dr. [name]) -TLSO brace for now -moving well with PT-- home health   GLF Physical deconditioning -PT/OT consulted- home health   R pleural effusion RML/RLL consolidation Presumed HFrEF exacerbation High suspicion for HFrEF exacerbation given AKI and elevated BNP -IR consulted for thoracentesis per surgery-- bloody -- will hold eliquis  for now -repeat chest x ray in AM   Liver enhancement on imaging -OP f/u with Cards/GI to discuss amiodarone  toxicity   PAF -PTA amiodarone  200mg  daily and Eliquis  5mg  BID (hold)   HFrEF -PTA atorvastatin  40mg  daily, irbesartan  150mg  daily, and torsemide  60mg  daily (NOTE: recent OP Cards tele appointment note on 11/20 suggested multiple missed appointments and recent increase in BLE and uptitration of torsemide  to 60mg  daily) -adjust BP meds to avoid hypotension   CKD-stage IV -baseline 2.5ish  DVT prophylaxis:     Code Status: Limited: Do not attempt resuscitation (DNR) -DNR-LIMITED -Do Not Intubate/DNI  Family Communication:   Disposition Plan:  Level of care: Progressive Status is: Inpatient   Consultants:  Trauma NS   Subjective: No SOB, no CP Walking well with PT  Objective: Vitals:   08/22/24 2315 08/23/24 0321 08/23/24 0747 08/23/24 1140  BP: (!) 126/50 (!) 152/63 (!) 129/53 (!) 103/44  Pulse: 71 79 85 69  Resp: 20 18 (!) 21 17  Temp:  97.7 F (36.5 C) 98.5 F (36.9 C) 97.7 F (36.5 C)  TempSrc: Oral Oral Oral Oral  SpO2: 91% 94% 100%  99%  Weight:      Height:        Intake/Output Summary (Last 24 hours) at 08/23/2024 1213 Last data filed at 08/23/2024 0730 Gross per 24 hour  Intake 240 ml  Output 400 ml  Net -160 ml   Filed Weights   08/22/24 0259  Weight: 83.9 kg    Examination:   General: Appearance:     Overweight male in no acute distress     Lungs:     respirations unlabored  Heart:    Normal heart rate.    MS:   All extremities are intact.    Neurologic:   Awake, alert       Data Reviewed: I have personally reviewed following labs and imaging studies  CBC: Recent Labs  Lab 08/22/24 0418 08/22/24 0903  WBC 6.8 8.4  NEUTROABS 5.0  --   HGB 9.0* 9.0*  HCT 28.1* 28.3*  MCV 92.1 92.8  PLT 142* 156   Basic Metabolic Panel: Recent Labs  Lab 08/22/24 0418 08/22/24 0903  NA 135  --   K 4.1  --   CL 103  --   CO2 19*  --   GLUCOSE 105*  --   BUN 44*  --   CREATININE 2.83* 2.89*  CALCIUM  9.5  --    GFR: Estimated Creatinine Clearance: 22.8 mL/min (A) (by C-G formula based on SCr of 2.89 mg/dL (H)). Liver Function Tests: Recent Labs  Lab 08/22/24 0418  AST 22  ALT 15  ALKPHOS  53  BILITOT 1.5*  PROT 6.3*  ALBUMIN  3.0*   No results for input(s): LIPASE, AMYLASE in the last 168 hours. No results for input(s): AMMONIA in the last 168 hours. Coagulation Profile: Recent Labs  Lab 08/22/24 0418  INR 1.6*   Cardiac Enzymes: No results for input(s): CKTOTAL, CKMB, CKMBINDEX, TROPONINI in the last 168 hours. BNP (last 3 results) No results for input(s): PROBNP in the last 8760 hours. HbA1C: No results for input(s): HGBA1C in the last 72 hours. CBG: No results for input(s): GLUCAP in the last 168 hours. Lipid Profile: No results for input(s): CHOL, HDL, LDLCALC, TRIG, CHOLHDL, LDLDIRECT in the last 72 hours. Thyroid Function Tests: No results for input(s): TSH, T4TOTAL, FREET4, T3FREE, THYROIDAB in the last 72 hours. Anemia  Panel: No results for input(s): VITAMINB12, FOLATE, FERRITIN, TIBC, IRON, RETICCTPCT in the last 72 hours. Sepsis Labs: Recent Labs  Lab 08/22/24 0903  PROCALCITON 0.44    Recent Results (from the past 240 hours)  Body fluid culture w Gram Stain     Status: None (Preliminary result)   Collection Time: 08/22/24 12:22 PM   Specimen: Lung, Right; Pleural Fluid  Result Value Ref Range Status   Specimen Description PLEURAL  Final   Special Requests RIGHT LUNG  Final   Gram Stain   Final    RARE WBC PRESENT, PREDOMINANTLY PMN NO ORGANISMS SEEN Performed at [facility], [address] [place], [place] [address]    Culture PENDING  Incomplete   Report Status PENDING  Incomplete         Radiology Studies: CT CHEST WO CONTRAST Result Date: 08/22/2024 CLINICAL DATA:  Status post fall with right pleural effusion and shortness of breath EXAM: CT CHEST WITHOUT CONTRAST TECHNIQUE: Multidetector CT imaging of the chest was performed following the standard protocol without IV contrast. RADIATION DOSE REDUCTION: This exam was performed according to the departmental dose-optimization program which includes automated exposure control, adjustment of the mA and/or kV according to patient size and/or use of iterative reconstruction technique. COMPARISON:  Earlier same day CT chest FINDINGS: Cardiovascular: Left chest wall ICD lead terminates in the right ventricle. Multichamber cardiomegaly. No significant pericardial fluid/thickening. Dilated main pulmonary artery measures 3.8 cm. Coronary artery calcifications and aortic atherosclerosis. Mediastinum/Nodes: Imaged thyroid gland without nodules meeting criteria for imaging follow-up by size. Mildly patulous esophagus. Left supraclavicular lymph node measures 10 mm (3:19). Unchanged prominent multi station mediastinal lymph nodes. Lungs/Pleura: The central airways are patent. Diffuse mosaic attenuation. Essentially unchanged appearance  compared to earlier same day CT chest. Similar atelectasis/consolidation involving the right upper, middle, and lower lobes. No pneumothorax. Unchanged large, mildly hyperattenuating right pleural effusion. Upper abdomen: Please see separately dictated earlier same day CT abdomen and pelvis report for detailed findings. Musculoskeletal: Thoracic spine fracture, better evaluated on same-day CT thoracic spine. Multilevel degenerative changes of the thoracic spine. Flowing anterior osteophytes over at least 4 contiguous thoracic vertebrae which can be seen in the setting of diffuse idiopathic skeletal hyperostosis (DISH). Unchanged wedging of T10 and T11. IMPRESSION: 1. Essentially unchanged appearance when compared to earlier same day chest CT. 2. Unchanged large, mildly hyperattenuating right pleural effusion, in keeping with hemothorax noted on subsequent thoracentesis. 3. Similar atelectasis/consolidation involving the right upper, middle, and lower lobes. 4. Diffuse mosaic attenuation, which can be seen in the setting of small airways disease. 5. Dilated main pulmonary artery, which can be seen in the setting of pulmonary arterial hypertension. 6. Unchanged prominent multi station mediastinal  lymph nodes and left supraclavicular lymph node, likely reactive. 7.  Aortic Atherosclerosis (ICD10-I70.0). Electronically Signed   By: Limin  Xu M.D.   On: 08/22/2024 16:47   DG Chest 1 View Result Date: 08/22/2024 CLINICAL DATA:  Status post right thoracentesis. EXAM: CHEST  1 VIEW COMPARISON:  None Available. FINDINGS: Stable mild cardiomegaly.  AICD remains in place. Moderate right pleural effusion and right lower lung atelectasis or infiltrate is seen. No pneumothorax visualized. IMPRESSION: No pneumothorax visualized. Moderate right pleural effusion and right lower lung atelectasis or infiltrate. Electronically Signed   By: Norleen DELENA Kil M.D.   On: 08/22/2024 12:56   CT L-SPINE NO CHARGE Result Date:  08/22/2024 EXAM: CT CHEST ABDOMEN PELVIS WITH THORACIC AND LUMBAR SPINE RECONSTRUCTIONS 08/22/2024 03:44:00 AM TECHNIQUE: CT of the chest, abdomen, pelvis was performed without the administration of intravenous contrast. Multiplanar reformatted images are provided for review, including reconstructed images of the thoracic and lumbar spine. Automated exposure control, iterative reconstruction, and/or weight based adjustment of the mA/kV was utilized to reduce the radiation dose to as low as reasonably achievable. COMPARISON: Portable chest 07/10/2024, chest CT without contrast 07/10/2024, chest CT high resolution study 09/08/2019, abdomen and pelvis CT without contrast 05/04/2024, abdomen and pelvis CT without contrast 04/01/2024. CLINICAL HISTORY: Polytrauma, blunt. Patient fell down an unknown number of steps from his wheelchair. FINDINGS: CT CHEST: THORACIC AORTA: The aorta and great vessels are moderate to heavily calcified. No acute traumatic injury of the aorta. MEDIASTINUM: Single lead left chest pacemaker/aid, stable right ventricular wire insertion. The heart is moderately enlarged. No pericardial effusion. Left main and patchy 3-vessel coronary calcifications. Mild central pulmonary venous distention again noted with prominent pulmonary trunk measuring 3.4 cm, indicating arterial hypertension. A 1.2 cm precarinal lymph node to the right is again noted with similar sized left-sided prevascular nodes also again seen. Patulous esophagus with normal wall thickness. The trachea and main bronchi are patent. There is mild diffuse bronchial thickening. No mediastinal hematoma or pneumomediastinum. No acute traumatic injury to the heart or pericardium. LUNGS: Interval increased moderate sized right pleural effusion. Most of this does layer posteriorly, but some of it could be loculated in the chest base anteriorly. The density of the right pleural effusion is greater than simple fluid, measuring up to 33 Hounsfield  units. A component of pleural hemorrhage or proteinaceous fluid such as due to infection or empyema, is not excluded. There is no air in the pleural fluid and no pneumothorax. There is consolidation versus compressive collapse over a majority of the right middle and lower lobes. Underlying pneumonia is a consideration. Nodules previously newly identified in the base of the right upper lobe and in the right lower lobe, would be obscured by the pleural fluid if still present. Mosaic attenuation of the lungs continues to be seen, consistent with air trapping and small airway disease. The lungs are otherwise clear. No acute traumatic injury to the lungs. No pulmonary contusion or laceration. There previously was a small left pleural effusion, which has cleared. CHEST WALL: No acute displaced rib fracture. No chest wall hematoma. No chest wall mass. CT ABDOMEN AND PELVIS: ABDOMINAL AORTA: Heavy aortic and branch vessel atherosclerosis without aneurysm. No acute traumatic injury of the aorta or iliac arteries. HEPATOBILIARY: Hypoattenuating lesions in the dome of the liver, measuring 4.6 x 3.9 x 2 cm in segments 8 and 2 have been previously characterized as hemangiomas on a study of 09/19/2018. There have been no significant interval changes. The liver is dense,  which could be seen with amiodarone  or heavy metal toxicity as well as hemochromatosis. There is cholelithiasis without gallbladder wall thickening or biliary dilatation. No acute liver abnormality is seen without contrast. SPLEEN: No acute traumatic injury. PANCREAS: No acute traumatic injury. ADRENAL GLANDS: No adrenal hemorrhage is seen. No acute traumatic injury. KIDNEYS: No renal or perinephric hemorrhage is seen. There is chronic perinephric stranding. No urinary stone or obstruction. No contour deforming mass of the unenhanced kidneys is seen. No acute traumatic injury. No hydronephrosis. GI TRACT: There is no bowel obstruction or inflammation. The gastric  wall is unremarkable. The appendix is not seen. There is advanced sigmoid diverticulosis without diverticulitis. Small fat-containing umbilical hernia. No acute traumatic injury of the bowel. PERITONEUM: No ascites or free air. No free fluid or free hemorrhage. RETROPERITONEUM: No retroperitoneal hematoma. BLADDER: The bladder is thickened but also not fully distended. Correlate clinically for cystitis. REPRODUCTIVE ORGANS: There are dystrophic calcifications within a mildly enlarged prostate gland. BONES: No acute traumatic fracture of the pelvis. 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 No acute regional skeletal fracture at the levels of the abdomen and pelvis. SOFT TISSUES: There is increased skin thickening and subcutaneous stranding over the low anterior abdominal wall, which could be congestive or due to cellulitis. Mild anasarca is seen in portions of the remaining body wall. THORACIC AND LUMBAR SPINE: BONES AND ALIGNMENT: *   **Thoracic Spine:** * Acute oblique nondisplaced fracture through the mid to anterior T6 vertebral body extending through bridging enthesopathic bone, without evidence of fracture transmission into the posterior elements and pedicles. There is no loss of vertebral height at T6. * Broad-based mid to lower thoracic kyphosis centered at T10-T11, where there is again noted bulky bridging bone anteriorly between the segments, moderate wedging of both segments, with interbody ankylosis consistent with old injury, with old spinous process removal at both levels with dorsal bone graft fusion. . * The rest of the thoracic spine also has extensive bridging enthesopathy beginning at T2, resulting in multilevel interbody fusions. This is eccentric to the right anterolateral aspect. There is ankylosis over multiple spinous processes. Facet joints are also fused at multiple levels with facet hypertrophy. * Above and below T10 and T11 the thoracic vertebrae are normal in  height. There is osteopenia. *   **Lumbar Spine:** *   No acute fracture is evident. * Chronic grade 1 degenerative anterolisthesis at L4-L5 without other abnormal alignment. * Advanced lumbar marginal osteophytosis with bulky bridging osteophytes T12-L1, L1-L2, L2-L3, and L4-L5 with attempted bridging osteophytes at L3-L4 and L5-S1. * The discs are of normal height apart from chronic disc space widening at L3-L4. Thank you *   Advanced facet hypertrophy most levels. * The spinous processes were previously removed at L2, L3, and L4 with spinous process shaving at L5. DEGENERATIVE CHANGES: * **Thoracic Spine:** Varying degrees of multilevel acquired foraminal stenosis, but this is most severe on the left at T1-T2, on the right at T4-T5, on the right greater than left at T10-T11, on the right at T11-T12. Posterior endplate ridging at the level of T10-T11 does flatten the ventral thecal sac and cause 7 mm thecal sac AP stenosis and probable mild flattening of the ventral cord without frank impression. Other levels do not show noteworthy soft tissue or bony encroachment on the thecal sac. *   **Lumbar Spine:** Acquired spinal stenosis is noted all levels except L1-L2, due to ligamentous facet hypertrophy and posterior endplate ridging, but it is  most severe at L3-L4 and L4-L5. Foraminal stenosis is moderate to severe bilaterally from L2-L3 through L4-L5. SOFT TISSUES: * **Thoracic Spine:** Mild edema in the adjacent soft tissues at T6, but there is no appreciable spinal canal hematoma. No paraspinal mass or hematoma. *   **Lumbar Spine:** No paraspinal mass or hematoma. IMPRESSION: 1. Acute oblique nondisplaced mid to anterior T6 vertebral body fracture extending through bridging enthesopathic bone without posterior element involvement, no vertebral height loss, with mild adjacent soft tissue edema and no appreciable spinal canal hematoma. 2. Interval increased moderate-sized right pleural effusion demonstrating  increased attenuation suggesting hemorrhagic or proteinaceous content; no pneumothorax. The rib cage is intact . 3. Right middle and lower lobe consolidation versus compressive atelectasis, with pneumonia and secondary empyema a consideration. 4. 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 5. Severe lumbar spinal stenosis at L3-L4 and L4-L5 with moderate to severe bilateral foraminal stenosis from L2-3 through L4-5. 6. Enlarged prostate. 7. Dense liver suggesting metallic or amiodarone  toxicity. 8. Cholelithiasis. 9. Cardiomegaly with aortic and coronary atherosclerosis. 10. Body wall edema, with increased skin thickening and stranding in the lower end of the ear canal which could represent cerumen or mastoid cellulitis. 11. Advanced sigmoid diverticulosis and additional detailed findings discussed above. Electronically signed by: Francis Quam MD 08/22/2024 05:00 AM EST RP Workstation: [id number]   CT T-SPINE NO CHARGE Result Date: 08/22/2024 EXAM: CT CHEST ABDOMEN PELVIS WITH THORACIC AND LUMBAR SPINE RECONSTRUCTIONS 08/22/2024 03:44:00 AM TECHNIQUE: CT of the chest, abdomen, pelvis was performed without the administration of intravenous contrast. Multiplanar reformatted images are provided for review, including reconstructed images of the thoracic and lumbar spine. Automated exposure control, iterative reconstruction, and/or weight based adjustment of the mA/kV was utilized to reduce the radiation dose to as low as reasonably achievable. COMPARISON: Portable chest 07/10/2024, chest CT without contrast 07/10/2024, chest CT high resolution study 09/08/2019, abdomen and pelvis CT without contrast 05/04/2024, abdomen and pelvis CT without contrast 04/01/2024. CLINICAL HISTORY: Polytrauma, blunt. Patient  fell down an unknown number of steps from his wheelchair. FINDINGS: CT CHEST: THORACIC AORTA: The aorta and great vessels are moderate to heavily calcified. No acute traumatic injury of the aorta. MEDIASTINUM: Single lead left chest pacemaker/aid, stable right ventricular wire insertion. The heart is moderately enlarged. No pericardial effusion. Left main and patchy 3-vessel coronary calcifications. Mild central pulmonary venous distention again noted with prominent pulmonary trunk measuring 3.4 cm, indicating arterial hypertension. A 1.2 cm precarinal lymph node to the right is again noted with similar sized left-sided prevascular nodes also again seen. Patulous esophagus with normal wall thickness. The trachea and main bronchi are patent. There is mild diffuse bronchial thickening. No mediastinal hematoma or pneumomediastinum. No acute traumatic injury to the heart or pericardium. LUNGS: Interval increased moderate sized right pleural effusion. Most of this does layer posteriorly, but some of it could be loculated in the chest base anteriorly. The density of the right pleural effusion is greater than simple fluid, measuring up to 33 Hounsfield units. A component of pleural hemorrhage or proteinaceous fluid such as due to infection or empyema, is not excluded. There is no air in the pleural fluid and no pneumothorax. There is consolidation versus compressive collapse over a majority of the right middle and lower lobes. Underlying pneumonia is a consideration. Nodules previously newly identified in the base of  the right upper lobe and in the right lower lobe, would be obscured by the pleural fluid if still present. Mosaic attenuation of the lungs continues to be seen, consistent with air trapping and small airway disease. The lungs are otherwise clear. No acute traumatic injury to the lungs. No pulmonary contusion or laceration. There previously was a small left pleural effusion, which has cleared. CHEST WALL: No  acute displaced rib fracture. No chest wall hematoma. No chest wall mass. CT ABDOMEN AND PELVIS: ABDOMINAL AORTA: Heavy aortic and branch vessel atherosclerosis without aneurysm. No acute traumatic injury of the aorta or iliac arteries. HEPATOBILIARY: Hypoattenuating lesions in the dome of the liver, measuring 4.6 x 3.9 x 2 cm in segments 8 and 2 have been previously characterized as hemangiomas on a study of 09/19/2018. There have been no significant interval changes. The liver is dense, which could be seen with amiodarone  or heavy metal toxicity as well as hemochromatosis. There is cholelithiasis without gallbladder wall thickening or biliary dilatation. No acute liver abnormality is seen without contrast. SPLEEN: No acute traumatic injury. PANCREAS: No acute traumatic injury. ADRENAL GLANDS: No adrenal hemorrhage is seen. No acute traumatic injury. KIDNEYS: No renal or perinephric hemorrhage is seen. There is chronic perinephric stranding. No urinary stone or obstruction. No contour deforming mass of the unenhanced kidneys is seen. No acute traumatic injury. No hydronephrosis. GI TRACT: There is no bowel obstruction or inflammation. The gastric wall is unremarkable. The appendix is not seen. There is advanced sigmoid diverticulosis without diverticulitis. Small fat-containing umbilical hernia. No acute traumatic injury of the bowel. PERITONEUM: No ascites or free air. No free fluid or free hemorrhage. RETROPERITONEUM: No retroperitoneal hematoma. BLADDER: The bladder is thickened but also not fully distended. Correlate clinically for cystitis. REPRODUCTIVE ORGANS: There are dystrophic calcifications within a mildly enlarged prostate gland. BONES: No acute traumatic fracture of the pelvis. 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 No acute regional skeletal fracture at the levels of the abdomen and pelvis. SOFT TISSUES: There is increased skin thickening and subcutaneous stranding over  the low anterior abdominal wall, which could be congestive or due to cellulitis. Mild anasarca is seen in portions of the remaining body wall. THORACIC AND LUMBAR SPINE: BONES AND ALIGNMENT: *   **Thoracic Spine:** * Acute oblique nondisplaced fracture through the mid to anterior T6 vertebral body extending through bridging enthesopathic bone, without evidence of fracture transmission into the posterior elements and pedicles. There is no loss of vertebral height at T6. * Broad-based mid to lower thoracic kyphosis centered at T10-T11, where there is again noted bulky bridging bone anteriorly between the segments, moderate wedging of both segments, with interbody ankylosis consistent with old injury, with old spinous process removal at both levels with dorsal bone graft fusion. . * The rest of the thoracic spine also has extensive bridging enthesopathy beginning at T2, resulting in multilevel interbody fusions. This is eccentric to the right anterolateral aspect. There is ankylosis over multiple spinous processes. Facet joints are also fused at multiple levels with facet hypertrophy. * Above and below T10 and T11 the thoracic vertebrae are normal in height. There is osteopenia. *   **Lumbar Spine:** *   No acute fracture is evident. * Chronic grade 1 degenerative anterolisthesis at L4-L5 without other abnormal alignment. * Advanced lumbar marginal osteophytosis with bulky bridging osteophytes T12-L1, L1-L2, L2-L3, and L4-L5 with attempted bridging osteophytes at L3-L4 and L5-S1. * The discs are of normal height apart from chronic  disc space widening at L3-L4. Thank you *   Advanced facet hypertrophy most levels. * The spinous processes were previously removed at L2, L3, and L4 with spinous process shaving at L5. DEGENERATIVE CHANGES: * **Thoracic Spine:** Varying degrees of multilevel acquired foraminal stenosis, but this is most severe on the left at T1-T2, on the right at T4-T5, on the right greater than left at  T10-T11, on the right at T11-T12. Posterior endplate ridging at the level of T10-T11 does flatten the ventral thecal sac and cause 7 mm thecal sac AP stenosis and probable mild flattening of the ventral cord without frank impression. Other levels do not show noteworthy soft tissue or bony encroachment on the thecal sac. *   **Lumbar Spine:** Acquired spinal stenosis is noted all levels except L1-L2, due to ligamentous facet hypertrophy and posterior endplate ridging, but it is most severe at L3-L4 and L4-L5. Foraminal stenosis is moderate to severe bilaterally from L2-L3 through L4-L5. SOFT TISSUES: * **Thoracic Spine:** Mild edema in the adjacent soft tissues at T6, but there is no appreciable spinal canal hematoma. No paraspinal mass or hematoma. *   **Lumbar Spine:** No paraspinal mass or hematoma. IMPRESSION: 1. Acute oblique nondisplaced mid to anterior T6 vertebral body fracture extending through bridging enthesopathic bone without posterior element involvement, no vertebral height loss, with mild adjacent soft tissue edema and no appreciable spinal canal hematoma. 2. Interval increased moderate-sized right pleural effusion demonstrating increased attenuation suggesting hemorrhagic or proteinaceous content; no pneumothorax. The rib cage is intact . 3. Right middle and lower lobe consolidation versus compressive atelectasis, with pneumonia and secondary empyema a consideration. 4. 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 5. Severe lumbar spinal stenosis at L3-L4 and L4-L5 with moderate to severe bilateral foraminal stenosis from L2-3 through L4-5. 6. Enlarged prostate. 7. Dense liver suggesting metallic or amiodarone  toxicity. 8. Cholelithiasis. 9. Cardiomegaly with aortic and coronary  atherosclerosis. 10. Body wall edema, with increased skin thickening and stranding in the lower end of the ear canal which could represent cerumen or mastoid cellulitis. 11. Advanced sigmoid diverticulosis and additional detailed findings discussed above. Electronically signed by: Francis Quam MD 08/22/2024 05:00 AM EST RP Workstation: [id number]   CT CHEST ABDOMEN PELVIS WO CONTRAST Result Date: 08/22/2024 EXAM: CT CHEST ABDOMEN PELVIS WITH THORACIC AND LUMBAR SPINE RECONSTRUCTIONS 08/22/2024 03:44:00 AM TECHNIQUE: CT of the chest, abdomen, pelvis was performed without the administration of intravenous contrast. Multiplanar reformatted images are provided for review, including reconstructed images of the thoracic and lumbar spine. Automated exposure control, iterative reconstruction, and/or weight based adjustment of the mA/kV was utilized to reduce the radiation dose to as low as reasonably achievable. COMPARISON: Portable chest 07/10/2024, chest CT without contrast 07/10/2024, chest CT high resolution study 09/08/2019, abdomen and pelvis CT without contrast 05/04/2024, abdomen and pelvis CT without contrast 04/01/2024. CLINICAL HISTORY: Polytrauma, blunt. Patient fell down an unknown number of steps from his wheelchair. FINDINGS: CT CHEST: THORACIC AORTA: The aorta and great vessels are moderate to heavily calcified. No acute traumatic injury of the aorta. MEDIASTINUM: Single lead left chest pacemaker/aid, stable right ventricular wire insertion. The heart is moderately enlarged. No pericardial effusion. Left main and patchy 3-vessel coronary calcifications. Mild central pulmonary venous distention again noted with prominent pulmonary trunk measuring 3.4 cm, indicating arterial hypertension. A 1.2 cm precarinal lymph node to  the right is again noted with similar sized left-sided prevascular nodes also again seen. Patulous esophagus with normal wall thickness. The trachea and main bronchi are patent. There is  mild diffuse bronchial thickening. No mediastinal hematoma or pneumomediastinum. No acute traumatic injury to the heart or pericardium. LUNGS: Interval increased moderate sized right pleural effusion. Most of this does layer posteriorly, but some of it could be loculated in the chest base anteriorly. The density of the right pleural effusion is greater than simple fluid, measuring up to 33 Hounsfield units. A component of pleural hemorrhage or proteinaceous fluid such as due to infection or empyema, is not excluded. There is no air in the pleural fluid and no pneumothorax. There is consolidation versus compressive collapse over a majority of the right middle and lower lobes. Underlying pneumonia is a consideration. Nodules previously newly identified in the base of the right upper lobe and in the right lower lobe, would be obscured by the pleural fluid if still present. Mosaic attenuation of the lungs continues to be seen, consistent with air trapping and small airway disease. The lungs are otherwise clear. No acute traumatic injury to the lungs. No pulmonary contusion or laceration. There previously was a small left pleural effusion, which has cleared. CHEST WALL: No acute displaced rib fracture. No chest wall hematoma. No chest wall mass. CT ABDOMEN AND PELVIS: ABDOMINAL AORTA: Heavy aortic and branch vessel atherosclerosis without aneurysm. No acute traumatic injury of the aorta or iliac arteries. HEPATOBILIARY: Hypoattenuating lesions in the dome of the liver, measuring 4.6 x 3.9 x 2 cm in segments 8 and 2 have been previously characterized as hemangiomas on a study of 09/19/2018. There have been no significant interval changes. The liver is dense, which could be seen with amiodarone  or heavy metal toxicity as well as hemochromatosis. There is cholelithiasis without gallbladder wall thickening or biliary dilatation. No acute liver abnormality is seen without contrast. SPLEEN: No acute traumatic injury. PANCREAS:  No acute traumatic injury. ADRENAL GLANDS: No adrenal hemorrhage is seen. No acute traumatic injury. KIDNEYS: No renal or perinephric hemorrhage is seen. There is chronic perinephric stranding. No urinary stone or obstruction. No contour deforming mass of the unenhanced kidneys is seen. No acute traumatic injury. No hydronephrosis. GI TRACT: There is no bowel obstruction or inflammation. The gastric wall is unremarkable. The appendix is not seen. There is advanced sigmoid diverticulosis without diverticulitis. Small fat-containing umbilical hernia. No acute traumatic injury of the bowel. PERITONEUM: No ascites or free air. No free fluid or free hemorrhage. RETROPERITONEUM: No retroperitoneal hematoma. BLADDER: The bladder is thickened but also not fully distended. Correlate clinically for cystitis. REPRODUCTIVE ORGANS: There are dystrophic calcifications within a mildly enlarged prostate gland. BONES: No acute traumatic fracture of the pelvis. 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 No acute regional skeletal fracture at the levels of the abdomen and pelvis. SOFT TISSUES: There is increased skin thickening and subcutaneous stranding over the low anterior abdominal wall, which could be congestive or due to cellulitis. Mild anasarca is seen in portions of the remaining body wall. THORACIC AND LUMBAR SPINE: BONES AND ALIGNMENT: *   **Thoracic Spine:** * Acute oblique nondisplaced fracture through the mid to anterior T6 vertebral body extending through bridging enthesopathic bone, without evidence of fracture transmission into the posterior elements and pedicles. There is no loss of vertebral height at T6. * Broad-based mid to lower thoracic kyphosis centered at T10-T11, where there is again noted bulky bridging bone anteriorly  between the segments, moderate wedging of both segments, with interbody ankylosis consistent with old injury, with old spinous process removal at both levels with  dorsal bone graft fusion. . * The rest of the thoracic spine also has extensive bridging enthesopathy beginning at T2, resulting in multilevel interbody fusions. This is eccentric to the right anterolateral aspect. There is ankylosis over multiple spinous processes. Facet joints are also fused at multiple levels with facet hypertrophy. * Above and below T10 and T11 the thoracic vertebrae are normal in height. There is osteopenia. *   **Lumbar Spine:** *   No acute fracture is evident. * Chronic grade 1 degenerative anterolisthesis at L4-L5 without other abnormal alignment. * Advanced lumbar marginal osteophytosis with bulky bridging osteophytes T12-L1, L1-L2, L2-L3, and L4-L5 with attempted bridging osteophytes at L3-L4 and L5-S1. * The discs are of normal height apart from chronic disc space widening at L3-L4. Thank you *   Advanced facet hypertrophy most levels. * The spinous processes were previously removed at L2, L3, and L4 with spinous process shaving at L5. DEGENERATIVE CHANGES: * **Thoracic Spine:** Varying degrees of multilevel acquired foraminal stenosis, but this is most severe on the left at T1-T2, on the right at T4-T5, on the right greater than left at T10-T11, on the right at T11-T12. Posterior endplate ridging at the level of T10-T11 does flatten the ventral thecal sac and cause 7 mm thecal sac AP stenosis and probable mild flattening of the ventral cord without frank impression. Other levels do not show noteworthy soft tissue or bony encroachment on the thecal sac. *   **Lumbar Spine:** Acquired spinal stenosis is noted all levels except L1-L2, due to ligamentous facet hypertrophy and posterior endplate ridging, but it is most severe at L3-L4 and L4-L5. Foraminal stenosis is moderate to severe bilaterally from L2-L3 through L4-L5. SOFT TISSUES: * **Thoracic Spine:** Mild edema in the adjacent soft tissues at T6, but there is no appreciable spinal canal hematoma. No paraspinal mass or hematoma. *    **Lumbar Spine:** No paraspinal mass or hematoma. IMPRESSION: 1. Acute oblique nondisplaced mid to anterior T6 vertebral body fracture extending through bridging enthesopathic bone without posterior element involvement, no vertebral height loss, with mild adjacent soft tissue edema and no appreciable spinal canal hematoma. 2. Interval increased moderate-sized right pleural effusion demonstrating increased attenuation suggesting hemorrhagic or proteinaceous content; no pneumothorax. The rib cage is intact . 3. Right middle and lower lobe consolidation versus compressive atelectasis, with pneumonia and secondary empyema a consideration. 4. 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 5. Severe lumbar spinal stenosis at L3-L4 and L4-L5 with moderate to severe bilateral foraminal stenosis from L2-3 through L4-5. 6. Enlarged prostate. 7. Dense liver suggesting metallic or amiodarone  toxicity. 8. Cholelithiasis. 9. Cardiomegaly with aortic and coronary atherosclerosis. 10. Body wall edema, with increased skin thickening and stranding in the lower end of the ear canal which could represent cerumen or mastoid cellulitis. 11. Advanced sigmoid diverticulosis and additional detailed findings discussed above. Electronically signed by: Francis Quam MD 08/22/2024 05:00 AM EST RP Workstation: [id number]        Scheduled Meds:  acetaminophen   650 mg Oral Q6H   amiodarone   200 mg Oral Daily   amLODipine   2.5 mg Oral Daily   atorvastatin   40 mg Oral Daily   irbesartan   150 mg Oral Daily  polyethylene glycol  17 g Oral Daily   torsemide   60 mg Oral Daily   Continuous Infusions:   LOS: 1 day    Time spent: 45 minutes spent on chart review, discussion with nursing staff, consultants, updating family and  interview/physical exam; more than 50% of that time was spent in counseling and/or coordination of care.    Harlene RAYMOND Bowl, DO [facility] Available via Epic secure chat 7am-7pm After these hours, please refer to coverage provider listed on [email or web address] 08/23/2024, 12:13 PM

## 2024-08-24 ENCOUNTER — Inpatient Hospital Stay (HOSPITAL_COMMUNITY)

## 2024-08-24 DIAGNOSIS — T07XXXA Unspecified multiple injuries, initial encounter: Secondary | ICD-10-CM | POA: Diagnosis not present

## 2024-08-24 LAB — CBC
HCT: 25.5 % — ABNORMAL LOW (ref 39.0–52.0)
Hemoglobin: 8.2 g/dL — ABNORMAL LOW (ref 13.0–17.0)
MCH: 29.6 pg (ref 26.0–34.0)
MCHC: 32.2 g/dL (ref 30.0–36.0)
MCV: 92.1 fL (ref 80.0–100.0)
Platelets: 134 K/uL — ABNORMAL LOW (ref 150–400)
RBC: 2.77 MIL/uL — ABNORMAL LOW (ref 4.22–5.81)
RDW: 14.6 % (ref 11.5–15.5)
WBC: 6.2 K/uL (ref 4.0–10.5)
nRBC: 0 % (ref 0.0–0.2)

## 2024-08-24 LAB — BASIC METABOLIC PANEL WITH GFR
Anion gap: 11 (ref 5–15)
BUN: 57 mg/dL — ABNORMAL HIGH (ref 8–23)
CO2: 19 mmol/L — ABNORMAL LOW (ref 22–32)
Calcium: 9.2 mg/dL (ref 8.9–10.3)
Chloride: 103 mmol/L (ref 98–111)
Creatinine, Ser: 3.77 mg/dL — ABNORMAL HIGH (ref 0.61–1.24)
GFR, Estimated: 16 mL/min — ABNORMAL LOW (ref >60)
Glucose, Bld: 104 mg/dL — ABNORMAL HIGH (ref 70–99)
Potassium: 4.6 mmol/L (ref 3.5–5.1)
Sodium: 133 mmol/L — ABNORMAL LOW (ref 135–145)

## 2024-08-24 NOTE — Progress Notes (Signed)
 NEUROSURGERY PROGRESS NOTE  Patient doing well, sitting up in the chair and ambulating well. Continue therapy   Temp:  [97.5 F (36.4 C)-97.7 F (36.5 C)] 97.7 F (36.5 C) (11/30 0744) Pulse Rate:  [66-71] 71 (11/30 0744) Resp:  [17-20] 19 (11/30 0744) BP: (103-140)/(38-53) 124/43 (11/30 0744) SpO2:  [97 %-99 %] 97 % (11/30 0744)   Suzen Chiquita Pean, NP 08/24/2024 10:23 AM

## 2024-08-24 NOTE — Plan of Care (Signed)

## 2024-08-24 NOTE — Progress Notes (Signed)
 PROGRESS NOTE    Dean Croft Sr.  FMW:[id number] DOB: 02/04/1951 DOA: 08/22/2024 PCP: [name], MD    Brief Narrative:  15M h/o CAD, PAF, ICM, cardiac arrest s/p AICD, HFrEF (EF 35-40% in 04/2024), HTN, HLD, DM2, OSA, and last admission in 06/2204 for HFrEF exacerbation who p/w back pain s/p GLF from wheelchair and found to have AKI and acute T6 fracture.    Assessment and Plan: Acute T6 fracture -NSGY consulted; apprec eval/recs (no surgery indicated; TLSO and OP f/u w/ Dr. [name]) -TLSO brace for now -moving well with PT-- home health   GLF Physical deconditioning -PT/OT consulted- home health   R pleural effusion RML/RLL consolidation Presumed HFrEF exacerbation -IR consulted for thoracentesis per surgery-- bloody -- will hold eliquis  for now -repeat chest x ray this AM similar-- defer to GS if need chest tube--- breathing well, not on O2   Liver enhancement on imaging -OP f/u with Cards/GI to discuss amiodarone  toxicity   PAF -PTA amiodarone  200mg  daily and Eliquis  5mg  BID (hold)   HFrEF -PTA atorvastatin  40mg  daily, irbesartan  150mg  daily, and torsemide  60mg  daily (NOTE: recent OP Cards tele appointment note on 11/20 suggested multiple missed appointments and recent increase in BLE and uptitration of torsemide  to 60mg  daily) -adjust BP meds to avoid hypotension   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  DVT prophylaxis:     Code Status: Limited: Do not attempt resuscitation (DNR) -DNR-LIMITED -Do Not Intubate/DNI    Disposition Plan:  Level of care: Progressive Status is: Inpatient   Consultants:  Trauma NS   Subjective: Breathing well, this LE edema better  Objective: Vitals:   08/23/24 2005 08/23/24 2329 08/24/24 0418 08/24/24 0744  BP: (!) 140/53 (!) 127/50 (!) 108/38 (!) 124/43  Pulse: 70 68 66 71  Resp: 17 17 20 19   Temp: 97.6 F (36.4 C) 97.6 F (36.4 C) (!) 97.5 F  (36.4 C) 97.7 F (36.5 C)  TempSrc: Oral Oral Oral Oral  SpO2: 98% 98% 99% 97%  Weight:      Height:        Intake/Output Summary (Last 24 hours) at 08/24/2024 1031 Last data filed at 08/24/2024 0744 Gross per 24 hour  Intake --  Output 275 ml  Net -275 ml   Filed Weights   08/22/24 0259  Weight: 83.9 kg    Examination:   General: Appearance:     Overweight male in no acute distress     Lungs:     respirations unlabored, diminished  Heart:    Normal heart rate.    MS:   All extremities are intact.    Neurologic:   Awake, alert       Data Reviewed: I have personally reviewed following labs and imaging studies  CBC: Recent Labs  Lab 08/22/24 0418 08/22/24 0903 08/24/24 0318  WBC 6.8 8.4 6.2  NEUTROABS 5.0  --   --   HGB 9.0* 9.0* 8.2*  HCT 28.1* 28.3* 25.5*  MCV 92.1 92.8 92.1  PLT 142* 156 134*   Basic Metabolic Panel: Recent Labs  Lab 08/22/24 0418 08/22/24 0903 08/24/24 0318  NA 135  --  133*  K 4.1  --  4.6  CL 103  --  103  CO2 19*  --  19*  GLUCOSE 105*  --  104*  BUN 44*  --  57*  CREATININE 2.83* 2.89* 3.77*  CALCIUM  9.5  --  9.2   GFR: Estimated Creatinine Clearance: 17.5 mL/min (A) (by C-G formula based on SCr of 3.77 mg/dL (H)). Liver Function Tests: Recent Labs  Lab 08/22/24 0418  AST 22  ALT 15  ALKPHOS 53  BILITOT 1.5*  PROT 6.3*  ALBUMIN  3.0*   No results for input(s): LIPASE, AMYLASE in the last 168 hours. No results for input(s): AMMONIA in the last 168 hours. Coagulation Profile: Recent Labs  Lab 08/22/24 0418  INR 1.6*   Cardiac Enzymes: No results for input(s): CKTOTAL, CKMB, CKMBINDEX, TROPONINI in the last 168 hours. BNP (last 3 results) No results for input(s): PROBNP in the last 8760 hours. HbA1C: No results for input(s): HGBA1C in the last 72 hours. CBG: No results for input(s): GLUCAP in the last 168 hours. Lipid Profile: No results for input(s): CHOL, HDL, LDLCALC,  TRIG, CHOLHDL, LDLDIRECT in the last 72 hours. Thyroid Function Tests: No results for input(s): TSH, T4TOTAL, FREET4, T3FREE, THYROIDAB in the last 72 hours. Anemia Panel: No results for input(s): VITAMINB12, FOLATE, FERRITIN, TIBC, IRON , RETICCTPCT in the last 72 hours. Sepsis Labs: Recent Labs  Lab 08/22/24 0903  PROCALCITON 0.44    Recent Results (from the past 240 hours)  Body fluid culture w Gram Stain     Status: None (Preliminary result)   Collection Time: 08/22/24 12:22 PM   Specimen: Lung, Right; Pleural Fluid  Result Value Ref Range Status   Specimen Description PLEURAL  Final   Special Requests RIGHT LUNG  Final   Gram Stain   Final    RARE WBC PRESENT, PREDOMINANTLY PMN NO ORGANISMS SEEN    Culture   Final    NO GROWTH < 24 HOURS Performed at [facility], [address], [place], [place] [address]    Report Status PENDING  Incomplete         Radiology Studies: DG CHEST PORT 1 VIEW Result Date: 08/24/2024 CLINICAL DATA:  Follow-up right pleural effusion. EXAM: PORTABLE CHEST 1 VIEW COMPARISON:  08/22/2024 FINDINGS: The heart size and mediastinal contours are within normal limits. Pacemaker again noted. Small to moderate right pleural effusion and right lower lung atelectasis or infiltrate show no significant change. No pneumothorax visualized. Left lung remains grossly clear. IMPRESSION: No significant change in right pleural effusion and right lower lung atelectasis or infiltrate. Electronically Signed   By: Norleen DELENA Kil M.D.   On: 08/24/2024 07:43   CT CHEST WO CONTRAST Result Date: 08/22/2024 CLINICAL DATA:  Status post fall with right pleural effusion and shortness of breath EXAM: CT CHEST WITHOUT CONTRAST TECHNIQUE: Multidetector CT imaging of the chest was performed following the standard protocol without IV contrast. RADIATION DOSE REDUCTION: This exam was performed according to the departmental dose-optimization program  which includes automated exposure control, adjustment of the mA and/or kV according to patient size and/or use of iterative reconstruction technique. COMPARISON:  Earlier same day CT chest FINDINGS: Cardiovascular: Left chest wall ICD lead terminates in the right ventricle. Multichamber cardiomegaly. No significant pericardial fluid/thickening. Dilated main pulmonary artery measures 3.8 cm. Coronary artery calcifications and aortic atherosclerosis. Mediastinum/Nodes: Imaged thyroid gland without nodules meeting criteria for imaging follow-up by size. Mildly patulous esophagus. Left supraclavicular lymph node measures 10 mm (3:19). Unchanged prominent multi station mediastinal lymph nodes. Lungs/Pleura: The central airways are patent. Diffuse mosaic attenuation. Essentially unchanged appearance compared to earlier same day CT chest. Similar atelectasis/consolidation involving the right upper, middle, and lower lobes. No pneumothorax. Unchanged large, mildly hyperattenuating right pleural effusion. Upper abdomen:  Please see separately dictated earlier same day CT abdomen and pelvis report for detailed findings. Musculoskeletal: Thoracic spine fracture, better evaluated on same-day CT thoracic spine. Multilevel degenerative changes of the thoracic spine. Flowing anterior osteophytes over at least 4 contiguous thoracic vertebrae which can be seen in the setting of diffuse idiopathic skeletal hyperostosis (DISH). Unchanged wedging of T10 and T11. IMPRESSION: 1. Essentially unchanged appearance when compared to earlier same day chest CT. 2. Unchanged large, mildly hyperattenuating right pleural effusion, in keeping with hemothorax noted on subsequent thoracentesis. 3. Similar atelectasis/consolidation involving the right upper, middle, and lower lobes. 4. Diffuse mosaic attenuation, which can be seen in the setting of small airways disease. 5. Dilated main pulmonary artery, which can be seen in the setting of pulmonary  arterial hypertension. 6. Unchanged prominent multi station mediastinal lymph nodes and left supraclavicular lymph node, likely reactive. 7.  Aortic Atherosclerosis (ICD10-I70.0). Electronically Signed   By: Limin  Xu M.D.   On: 08/22/2024 16:47   DG Chest 1 View Result Date: 08/22/2024 CLINICAL DATA:  Status post right thoracentesis. EXAM: CHEST  1 VIEW COMPARISON:  None Available. FINDINGS: Stable mild cardiomegaly.  AICD remains in place. Moderate right pleural effusion and right lower lung atelectasis or infiltrate is seen. No pneumothorax visualized. IMPRESSION: No pneumothorax visualized. Moderate right pleural effusion and right lower lung atelectasis or infiltrate. Electronically Signed   By: Norleen DELENA Kil M.D.   On: 08/22/2024 12:56        Scheduled Meds:  acetaminophen   650 mg Oral Q6H   amiodarone   200 mg Oral Daily   atorvastatin   40 mg Oral Daily   polyethylene glycol  17 g Oral Daily   Continuous Infusions:   LOS: 2 days    Time spent: 45 minutes spent on chart review, discussion with nursing staff, consultants, updating family and interview/physical exam; more than 50% of that time was spent in counseling and/or coordination of care.    Harlene RAYMOND Bowl, DO [facility] Available via Epic secure chat 7am-7pm After these hours, please refer to coverage provider listed on [email or web address] 08/24/2024, 10:31 AM

## 2024-08-24 NOTE — Progress Notes (Signed)
 Subjective/Chief Complaint: Slept in his chair overnight and reports that helped with his pain. Breathing remains unlabored. Hb 8.2 from 9. Cr up to 3.77 (2.9).    Objective: Vital signs in last 24 hours: Temp:  [97.5 F (36.4 C)-97.7 F (36.5 C)] 97.7 F (36.5 C) (11/30 0744) Pulse Rate:  [66-71] 71 (11/30 0744) Resp:  [17-20] 19 (11/30 0744) BP: (103-140)/(38-53) 124/43 (11/30 0744) SpO2:  [97 %-99 %] 97 % (11/30 0744) Last BM Date : 08/22/24  Intake/Output from previous day: 11/29 0701 - 11/30 0700 In: 240 [P.O.:240] Out: -  Intake/Output this shift: Total I/O In: -  Out: 275 [Urine:275]  Alert, well-appearing Unlabored respirations TLSO brace in place Chronic lower extremity edema  Lab Results:  Recent Labs    08/22/24 0903 08/24/24 0318  WBC 8.4 6.2  HGB 9.0* 8.2*  HCT 28.3* 25.5*  PLT 156 134*   BMET Recent Labs    08/22/24 0418 08/22/24 0903 08/24/24 0318  NA 135  --  133*  K 4.1  --  4.6  CL 103  --  103  CO2 19*  --  19*  GLUCOSE 105*  --  104*  BUN 44*  --  57*  CREATININE 2.83* 2.89* 3.77*  CALCIUM  9.5  --  9.2   PT/INR Recent Labs    08/22/24 0418  LABPROT 20.1*  INR 1.6*   ABG No results for input(s): PHART, HCO3 in the last 72 hours.  Invalid input(s): PCO2, PO2  Studies/Results: DG CHEST PORT 1 VIEW Result Date: 08/24/2024 CLINICAL DATA:  Follow-up right pleural effusion. EXAM: PORTABLE CHEST 1 VIEW COMPARISON:  08/22/2024 FINDINGS: The heart size and mediastinal contours are within normal limits. Pacemaker again noted. Small to moderate right pleural effusion and right lower lung atelectasis or infiltrate show no significant change. No pneumothorax visualized. Left lung remains grossly clear. IMPRESSION: No significant change in right pleural effusion and right lower lung atelectasis or infiltrate. Electronically Signed   By: Norleen DELENA Kil M.D.   On: 08/24/2024 07:43   CT CHEST WO CONTRAST Result Date:  08/22/2024 CLINICAL DATA:  Status post fall with right pleural effusion and shortness of breath EXAM: CT CHEST WITHOUT CONTRAST TECHNIQUE: Multidetector CT imaging of the chest was performed following the standard protocol without IV contrast. RADIATION DOSE REDUCTION: This exam was performed according to the departmental dose-optimization program which includes automated exposure control, adjustment of the mA and/or kV according to patient size and/or use of iterative reconstruction technique. COMPARISON:  Earlier same day CT chest FINDINGS: Cardiovascular: Left chest wall ICD lead terminates in the right ventricle. Multichamber cardiomegaly. No significant pericardial fluid/thickening. Dilated main pulmonary artery measures 3.8 cm. Coronary artery calcifications and aortic atherosclerosis. Mediastinum/Nodes: Imaged thyroid gland without nodules meeting criteria for imaging follow-up by size. Mildly patulous esophagus. Left supraclavicular lymph node measures 10 mm (3:19). Unchanged prominent multi station mediastinal lymph nodes. Lungs/Pleura: The central airways are patent. Diffuse mosaic attenuation. Essentially unchanged appearance compared to earlier same day CT chest. Similar atelectasis/consolidation involving the right upper, middle, and lower lobes. No pneumothorax. Unchanged large, mildly hyperattenuating right pleural effusion. Upper abdomen: Please see separately dictated earlier same day CT abdomen and pelvis report for detailed findings. Musculoskeletal: Thoracic spine fracture, better evaluated on same-day CT thoracic spine. Multilevel degenerative changes of the thoracic spine. Flowing anterior osteophytes over at least 4 contiguous thoracic vertebrae which can be seen in the setting of diffuse idiopathic skeletal hyperostosis (DISH). Unchanged wedging of T10 and T11. IMPRESSION:  1. Essentially unchanged appearance when compared to earlier same day chest CT. 2. Unchanged large, mildly  hyperattenuating right pleural effusion, in keeping with hemothorax noted on subsequent thoracentesis. 3. Similar atelectasis/consolidation involving the right upper, middle, and lower lobes. 4. Diffuse mosaic attenuation, which can be seen in the setting of small airways disease. 5. Dilated main pulmonary artery, which can be seen in the setting of pulmonary arterial hypertension. 6. Unchanged prominent multi station mediastinal lymph nodes and left supraclavicular lymph node, likely reactive. 7.  Aortic Atherosclerosis (ICD10-I70.0). Electronically Signed   By: Limin  Xu M.D.   On: 08/22/2024 16:47   DG Chest 1 View Result Date: 08/22/2024 CLINICAL DATA:  Status post right thoracentesis. EXAM: CHEST  1 VIEW COMPARISON:  None Available. FINDINGS: Stable mild cardiomegaly.  AICD remains in place. Moderate right pleural effusion and right lower lung atelectasis or infiltrate is seen. No pneumothorax visualized. IMPRESSION: No pneumothorax visualized. Moderate right pleural effusion and right lower lung atelectasis or infiltrate. Electronically Signed   By: Norleen DELENA Kil M.D.   On: 08/22/2024 12:56    Anti-infectives: Anti-infectives (From admission, onward)    None       Assessment/Plan: A/P: 73 year old male with multiple medical problems who sustained a fall a few days ago   -T6 vb fx per NSG, TLSO brace - Moderate right pleural effusion (does have small bilateral pleural effusions on CT in mid October) - S/p thoracentesis 11/28 with bloody output.   - CXR today shows persistent effusion, may ask IR for reconsider additional drainage -Aggressive pulmonary toilet, mobilize as able     -multilevel foraminal/spinal stenoses/ severe degenerative disease  -dense liver suggesting metallic or amiodarone  toxicity -Caridomegaly and aortic/acoronary atherosclerosis -Sigmoid diverticulosis   LOS: 2 days    Cordella DELENA Idler 08/24/2024

## 2024-08-25 ENCOUNTER — Other Ambulatory Visit: Payer: Self-pay

## 2024-08-25 DIAGNOSIS — T07XXXA Unspecified multiple injuries, initial encounter: Secondary | ICD-10-CM | POA: Diagnosis not present

## 2024-08-25 DIAGNOSIS — I1310 Hypertensive heart and chronic kidney disease without heart failure, with stage 1 through stage 4 chronic kidney disease, or unspecified chronic kidney disease: Secondary | ICD-10-CM

## 2024-08-25 DIAGNOSIS — J90 Pleural effusion, not elsewhere classified: Secondary | ICD-10-CM | POA: Diagnosis not present

## 2024-08-25 DIAGNOSIS — I5023 Acute on chronic systolic (congestive) heart failure: Secondary | ICD-10-CM | POA: Diagnosis not present

## 2024-08-25 LAB — CBC
HCT: 28.4 % — ABNORMAL LOW (ref 39.0–52.0)
Hemoglobin: 8.8 g/dL — ABNORMAL LOW (ref 13.0–17.0)
MCH: 29 pg (ref 26.0–34.0)
MCHC: 31 g/dL (ref 30.0–36.0)
MCV: 93.7 fL (ref 80.0–100.0)
Platelets: 190 K/uL (ref 150–400)
RBC: 3.03 MIL/uL — ABNORMAL LOW (ref 4.22–5.81)
RDW: 14.7 % (ref 11.5–15.5)
WBC: 6.7 K/uL (ref 4.0–10.5)
nRBC: 0 % (ref 0.0–0.2)

## 2024-08-25 LAB — BASIC METABOLIC PANEL WITH GFR
Anion gap: 8 (ref 5–15)
BUN: 70 mg/dL — ABNORMAL HIGH (ref 8–23)
CO2: 24 mmol/L (ref 22–32)
Calcium: 9.6 mg/dL (ref 8.9–10.3)
Chloride: 101 mmol/L (ref 98–111)
Creatinine, Ser: 3.78 mg/dL — ABNORMAL HIGH (ref 0.61–1.24)
GFR, Estimated: 16 mL/min — ABNORMAL LOW (ref >60)
Glucose, Bld: 85 mg/dL (ref 70–99)
Potassium: 5.2 mmol/L — ABNORMAL HIGH (ref 3.5–5.1)
Sodium: 133 mmol/L — ABNORMAL LOW (ref 135–145)

## 2024-08-25 LAB — BODY FLUID CULTURE W GRAM STAIN: Culture: NO GROWTH

## 2024-08-25 LAB — COOXEMETRY PANEL
Carboxyhemoglobin: 2.3 % — ABNORMAL HIGH (ref 0.5–1.5)
Methemoglobin: <0.7 % (ref 0.0–1.5)
O2 Saturation: 59.2 %
Total hemoglobin: 8.8 g/dL — ABNORMAL LOW (ref 12.0–16.0)

## 2024-08-25 LAB — HEPATIC FUNCTION PANEL
ALT: 15 U/L (ref 0–44)
AST: 21 U/L (ref 15–41)
Albumin: 3.1 g/dL — ABNORMAL LOW (ref 3.5–5.0)
Alkaline Phosphatase: 71 U/L (ref 38–126)
Bilirubin, Direct: 0.6 mg/dL — ABNORMAL HIGH (ref 0.0–0.2)
Indirect Bilirubin: 0.8 mg/dL (ref 0.3–0.9)
Total Bilirubin: 1.4 mg/dL — ABNORMAL HIGH (ref 0.0–1.2)
Total Protein: 6.7 g/dL (ref 6.5–8.1)

## 2024-08-25 LAB — LACTIC ACID, PLASMA
Lactic Acid, Venous: 1.8 mmol/L (ref 0.5–1.9)
Lactic Acid, Venous: 1.5 mmol/L (ref 0.5–1.9)

## 2024-08-25 LAB — PATHOLOGIST SMEAR REVIEW

## 2024-08-25 MED ORDER — ENOXAPARIN SODIUM 30 MG/0.3ML IJ SOSY
30.0000 mg | PREFILLED_SYRINGE | INTRAMUSCULAR | Status: DC
  Administered 2024-08-25 – 2024-08-27 (×3): 30 mg via SUBCUTANEOUS
  Filled 2024-08-25 (×3): qty 0.3

## 2024-08-25 MED ORDER — SODIUM CHLORIDE 0.9% FLUSH
10.0000 mL | Freq: Two times a day (BID) | INTRAVENOUS | Status: DC
  Administered 2024-08-25 – 2024-08-28 (×6): 10 mL
  Administered 2024-08-28: 20 mL
  Administered 2024-08-29: 30 mL
  Administered 2024-08-29: 10 mL
  Administered 2024-08-30: 20 mL
  Administered 2024-08-30 – 2024-08-31 (×3): 10 mL
  Administered 2024-09-01: 20 mL
  Administered 2024-09-01: 10 mL
  Administered 2024-09-02: 20 mL
  Administered 2024-09-02 – 2024-09-06 (×9): 10 mL
  Administered 2024-09-07 – 2024-09-08 (×2): 20 mL
  Administered 2024-09-09: 09:00:00 10 mL
  Administered 2024-09-10: 09:00:00 30 mL
  Administered 2024-09-10 – 2024-09-11 (×2): 10 mL
  Administered 2024-09-11: 10:00:00 30 mL
  Administered 2024-09-12 – 2024-09-14 (×6): 10 mL
  Administered 2024-09-15: 20 mL

## 2024-08-25 MED ORDER — SODIUM CHLORIDE 0.9% FLUSH
10.0000 mL | INTRAVENOUS | Status: DC | PRN
  Administered 2024-08-31: 10 mL

## 2024-08-25 MED ORDER — CHLORHEXIDINE GLUCONATE CLOTH 2 % EX PADS
6.0000 | MEDICATED_PAD | Freq: Every day | CUTANEOUS | Status: DC
  Administered 2024-08-25 – 2024-09-15 (×20): 6 via TOPICAL

## 2024-08-25 MED ORDER — PNEUMOCOCCAL 20-VAL CONJ VACC 0.5 ML IM SUSY
0.5000 mL | PREFILLED_SYRINGE | INTRAMUSCULAR | Status: AC
  Administered 2024-08-26: 0.5 mL via INTRAMUSCULAR
  Filled 2024-08-25: qty 0.5

## 2024-08-25 MED ORDER — MILRINONE LACTATE IN DEXTROSE 20-5 MG/100ML-% IV SOLN
0.2500 ug/kg/min | INTRAVENOUS | Status: DC
  Administered 2024-08-25 – 2024-08-26 (×2): 0.25 ug/kg/min via INTRAVENOUS
  Filled 2024-08-25 (×3): qty 100

## 2024-08-25 NOTE — Progress Notes (Signed)
 NEUROSURGERY PROGRESS NOTE  Doing well. Complains of appropriate back soreness. Pain is the worst when getting out of bed   Temp:  [97.5 F (36.4 C)-98.2 F (36.8 C)] 97.5 F (36.4 C) (12/01 0838) Pulse Rate:  [57-84] 70 (12/01 1100) Resp:  [14-23] 14 (12/01 1100) BP: (117-128)/(36-50) 124/44 (12/01 1100) SpO2:  [94 %-100 %] 100 % (12/01 1100) Weight:  [91.9 kg] 91.9 kg (12/01 0500)  Plan: Ok to discharge home from our standpoint and follow up in office in 2 weeks  Suzen Chiquita Pean, NP 08/25/2024 4:17 PM

## 2024-08-25 NOTE — Plan of Care (Signed)

## 2024-08-25 NOTE — Progress Notes (Signed)
 Peripherally Inserted Central Catheter Placement  The IV Nurse has discussed with the patient and/or persons authorized to consent for the patient, the purpose of this procedure and the potential benefits and risks involved with this procedure.  The benefits include less needle sticks, lab draws from the catheter, and the patient may be discharged home with the catheter. Risks include, but not limited to, infection, bleeding, blood clot (thrombus formation), and puncture of an artery; nerve damage and irregular heartbeat and possibility to perform a PICC exchange if needed/ordered by physician.  Alternatives to this procedure were also discussed.  Bard Power PICC patient education guide, fact sheet on infection prevention and patient information card has been provided to patient /or left at bedside.  PICC inserted by Remonia Brought, RN  PICC Placement Documentation  PICC Double Lumen 08/25/24 Right Basilic 38 cm 0 cm (Active)  Indication for Insertion or Continuance of Line Vasoactive infusions 08/25/24 1805  Exposed Catheter (cm) 0 cm 08/25/24 1805  Site Assessment Clean, Dry, Intact 08/25/24 1805  Lumen #1 Status Flushed;Saline locked;Blood return noted 08/25/24 1805  Lumen #2 Status Flushed;Saline locked;Blood return noted 08/25/24 1805  Dressing Type Transparent;Securing device 08/25/24 1805  Dressing Status Antimicrobial disc/dressing in place;Clean, Dry, Intact 08/25/24 1805  Line Care Connections checked and tightened 08/25/24 1805  Line Adjustment (NICU/IV Team Only) No 08/25/24 1805  Dressing Intervention New dressing;Adhesive placed at insertion site (IV team only) 08/25/24 1805  Dressing Change Due 09/01/24 08/25/24 1805       Omarie Parcell, Cherene Place 08/25/2024, 6:06 PM

## 2024-08-25 NOTE — Progress Notes (Signed)
 Physical Therapy Treatment Patient Details Name: Dean Bushong Sr. MRN: [id number] DOB: 1951/07/30 Today's Date: 08/25/2024   History of Present Illness 73 yo male s/p fall down 8 stairs in wheelchair. CT (+) T6 vertebral body fx, neurosurgery states no plan for surgery but needs TLSO brace. S/p R thoracentesis 11/28 yielding 500 mL bloody fluid. PMH AICD, arthritis, HLD, HFrEF, ICD, PAF on Eliquis , HTN, CAD, HLD, OSA on cpap, T2DM, CKD, HTN, laminectomy, cardiac arrest (2017).    PT Comments  Patient up in chair with brace on, however loose. Pt reports he loosens it when sitting. Educated in using pull string to further tighten brace upon standing. Patient transfers with supervision without cues and good technique. Ambulated 150 ft with RW and supervision with cues for upright posture and proximity to RW. Pt declined stair training stating the stairs here are not like his at home (where he can reach bil rails at the same time). Also reports he was going up/down stairs at home for 2 days prior to coming to hospital. Reports he has multiple family members who can transport RW up to second level as needed. Goals met and updated. No follow-up PT indicated at this time. Will need RW.    If plan is discharge home, recommend the following: Help with stairs or ramp for entrance   Can travel by private vehicle        Equipment Recommendations  Rolling walker (2 wheels)    Recommendations for Other Services       Precautions / Restrictions Precautions Precautions: Fall;Back Recall of Precautions/Restrictions: Intact Required Braces or Orthoses: Spinal Brace Spinal Brace: Thoracolumbosacral orthotic Restrictions Weight Bearing Restrictions Per Provider Order: No     Mobility  Bed Mobility               General bed mobility comments: received in recliner with brace on    Transfers Overall transfer level: Needs assistance Equipment used: Rolling walker (2 wheels) Transfers: Sit  to/from Stand Sit to Stand: Supervision           General transfer comment: demonstrated proper technique    Ambulation/Gait Ambulation/Gait assistance: Supervision Gait Distance (Feet): 150 Feet Assistive device: Rolling walker (2 wheels) Gait Pattern/deviations: Step-through pattern, Decreased stride length, Trunk flexed Gait velocity: decr     General Gait Details: cues for proximity to RW, upright posture   Stairs         General stair comments: pt deferred stair training; reports he was home for 2 days after fall and going up/down stairs; reports he can reach both rails at home and here he cannot   Wheelchair Mobility     Tilt Bed    Modified Rankin (Stroke Patients Only)       Balance Overall balance assessment: Needs assistance Sitting-balance support: No upper extremity supported, Feet supported Sitting balance-Leahy Scale: Fair     Standing balance support: Bilateral upper extremity supported Standing balance-Leahy Scale: Fair Standing balance comment: able to let go of RW and maintain balance while he adjusts brace before walking                            Communication Communication Communication: No apparent difficulties  Cognition Arousal: Alert Behavior During Therapy: [facility] for tasks assessed/performed                             Following commands: Intact  Cueing Cueing Techniques: Verbal cues  Exercises      General Comments General comments (skin integrity, edema, etc.): on RA      Pertinent Vitals/Pain Pain Assessment Pain Assessment: Faces Faces Pain Scale: Hurts a little bit Pain Location: R side back Pain Descriptors / Indicators: Guarding, Discomfort Pain Intervention(s): Limited activity within patient's tolerance, Monitored during session    Home Living                          Prior Function            PT Goals (current goals can now be found in the care plan section) Acute  Rehab PT Goals Patient Stated Goal: return home PT Goal Formulation: With patient Time For Goal Achievement: 09/08/24 Potential to Achieve Goals: Good Progress towards PT goals: Goals met and updated - see care plan    Frequency    Min 2X/week      PT Plan      Co-evaluation              AM-PAC PT 6 Clicks Mobility   Outcome Measure  Help needed turning from your back to your side while in a flat bed without using bedrails?: A Little Help needed moving from lying on your back to sitting on the side of a flat bed without using bedrails?: A Little Help needed moving to and from a bed to a chair (including a wheelchair)?: A Little Help needed standing up from a chair using your arms (e.g., wheelchair or bedside chair)?: A Little Help needed to walk in hospital room?: A Little Help needed climbing 3-5 steps with a railing? : A Little 6 Click Score: 18    End of Session Equipment Utilized During Treatment: Back brace Activity Tolerance: Patient tolerated treatment well Patient left: in chair;with call bell/phone within reach;with chair alarm set   PT Visit Diagnosis: Other abnormalities of gait and mobility (R26.89);Muscle weakness (generalized) (M62.81)     Time: 9182-9167 PT Time Calculation (min) (ACUTE ONLY): 15 min  Charges:    $Gait Training: 8-22 mins PT General Charges $$ ACUTE PT VISIT: 1 Visit                      Macario RAMAN, PT Acute Rehabilitation Services  Office [phone]    Macario SHAUNNA Soja 08/25/2024, 8:41 AM

## 2024-08-25 NOTE — Progress Notes (Signed)
   Subjective/Chief Complaint: Up in the chair. States he is feeling better and stronger. Reports pain in his back when he gets out of bed, improved with pain meds and rest. Tolerating PO. Walking with PT and denies SOB. [phone] on IS.    Objective: Vital signs in last 24 hours: Temp:  [97.5 F (36.4 C)-98.2 F (36.8 C)] 97.6 F (36.4 C) (12/01 0408) Pulse Rate:  [57-84] 78 (12/01 0838) Resp:  [14-24] 20 (12/01 0838) BP: (110-128)/(25-50) 126/42 (12/01 0838) SpO2:  [94 %-100 %] 100 % (12/01 0838) Weight:  [91.9 kg] 91.9 kg (12/01 0500) Last BM Date : 08/22/24  Intake/Output from previous day: 11/30 0701 - 12/01 0700 In: 780 [P.O.:780] Out: 275 [Urine:275] Intake/Output this shift: Total I/O In: -  Out: 250 [Urine:250]  Alert, well-appearing, up in the chair Unlabored respirations, 100% ORA.  TLSO brace in place Chronic lower extremity edema  Lab Results:  Recent Labs    08/24/24 0318  WBC 6.2  HGB 8.2*  HCT 25.5*  PLT 134*   BMET Recent Labs    08/24/24 0318  NA 133*  K 4.6  CL 103  CO2 19*  GLUCOSE 104*  BUN 57*  CREATININE 3.77*  CALCIUM  9.2   PT/INR No results for input(s): LABPROT, INR in the last 72 hours.  ABG No results for input(s): PHART, HCO3 in the last 72 hours.  Invalid input(s): PCO2, PO2  Studies/Results: DG CHEST PORT 1 VIEW Result Date: 08/24/2024 CLINICAL DATA:  Follow-up right pleural effusion. EXAM: PORTABLE CHEST 1 VIEW COMPARISON:  08/22/2024 FINDINGS: The heart size and mediastinal contours are within normal limits. Pacemaker again noted. Small to moderate right pleural effusion and right lower lung atelectasis or infiltrate show no significant change. No pneumothorax visualized. Left lung remains grossly clear. IMPRESSION: No significant change in right pleural effusion and right lower lung atelectasis or infiltrate. Electronically Signed   By: Norleen DELENA Kil M.D.   On: 08/24/2024 07:43     Anti-infectives: Anti-infectives (From admission, onward)    None       Assessment/Plan: A/P: 73 year old male with multiple medical problems who sustained a fall a few days ago   -T6 vb fx per NSG, TLSO brace, outpatient follow up in 2 weeks. - Moderate right pleural effusion (does have small bilateral pleural effusions on CT in mid October) - S/p thoracentesis 11/28 with bloody output.   - CXR yesterday shows persistent effusion, patient is symptomatically better and maintaining O2 sats. Heart failure team seeing him today to help with meds/diuresis in the setting of AKI. Will discuss with MD but will plan to hold off on IR consult today given his ongoing clinical improvement s/p initial thora. CXR in AM.  -Aggressive pulmonary toilet, mobilize as able  - if hgb stable today, ok to start DVT ppx with lovenox . Would continue to hold DOAC For now, still may need repeat thoracentesis vs pleural drainage tube.   FEN: 2g sodium diet  ID: none VTE: SCD's,  Foley: none Dispo: Progressive care   -multilevel foraminal/spinal stenoses/ severe degenerative disease  -dense liver suggesting metallic or amiodarone  toxicity -Caridomegaly and aortic/acoronary atherosclerosis -Sigmoid diverticulosis   LOS: 3 days    Almarie GORMAN Pringle 08/25/2024

## 2024-08-25 NOTE — Progress Notes (Signed)
 PROGRESS NOTE    Dean Croft Sr.  FMW:[id number] DOB: [birth year] DOA: 08/22/2024 PCP: [name], MD    Brief Narrative:  78M h/o CAD, PAF, ICM, cardiac arrest s/p AICD, HFrEF (EF 35-40% in 04/2024), HTN, HLD, DM2, OSA, and last admission in 06/2204 for HFrEF exacerbation who p/w back pain s/p GLF from wheelchair and found to have AKI and acute T6 fracture.    Assessment and Plan: Acute T6 fracture -NSGY consulted; apprec eval/recs (no surgery indicated; TLSO and OP f/u w/ Dr. [name]) -TLSO brace for now -moving well with PT-- home health   GLF Physical deconditioning -PT/OT consulted- home health   R pleural effusion RML/RLL consolidation Presumed HFrEF exacerbation -IR consulted for thoracentesis per surgery-- bloody -- will hold eliquis  for now but start lovenox  -repeat chest x ray  similar--  breathing well, not on O2- hold on any further procedures   Liver enhancement on imaging -OP f/u with Cards/GI to discuss amiodarone  toxicity   PAF -PTA amiodarone  200mg  daily and Eliquis  5mg  BID (hold)   HFrEF -PTA atorvastatin  40mg  daily, irbesartan  150mg  daily, and torsemide  60mg  daily (NOTE: recent OP Cards tele appointment note on 11/20 suggested multiple missed appointments and recent increase in BLE and uptitration of torsemide  to 60mg  daily) -adjust BP meds to avoid hypotension - Patient's creatinine went up when started on home medications so those have been held and will get cardiology consult for recommendations of medications going forward   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 -baseline 2.5ish-- up to 3.78 -Bladder scan - Hold torsemide    DVT prophylaxis: enoxaparin  (LOVENOX ) injection 30 mg Start: 08/25/24 1000    Code Status: Limited: Do not attempt resuscitation (DNR) -DNR-LIMITED -Do Not Intubate/DNI    Disposition Plan:  Level of care: Progressive Status is: Inpatient   Consultants:  Trauma NS   Subjective: Moving well with PT  Objective: Vitals:    08/25/24 0500 08/25/24 0836 08/25/24 0838 08/25/24 1100  BP:  (!) 117/50 (!) 126/42 (!) 124/44  Pulse:  84 78 70  Resp:  (!) 23 20 14   Temp:   (!) 97.5 F (36.4 C)   TempSrc:   Oral   SpO2:  98% 100% 100%  Weight: 91.9 kg     Height:        Intake/Output Summary (Last 24 hours) at 08/25/2024 1203 Last data filed at 08/25/2024 0900 Gross per 24 hour  Intake 780 ml  Output 250 ml  Net 530 ml   Filed Weights   08/22/24 0259 08/25/24 0500  Weight: 83.9 kg 91.9 kg    Examination:   General: Appearance:     Overweight male in no acute distress     Lungs:     diminished  Heart:    Normal heart rate. + LE Edema   MS:   All extremities are intact.    Neurologic:   Awake, alert       Data Reviewed: I have personally reviewed following labs and imaging studies  CBC: Recent Labs  Lab 08/22/24 0418 08/22/24 0903 08/24/24 0318 08/25/24 0926  WBC 6.8 8.4 6.2 6.7  NEUTROABS 5.0  --   --   --   HGB 9.0* 9.0* 8.2* 8.8*  HCT 28.1* 28.3* 25.5* 28.4*  MCV 92.1 92.8 92.1 93.7  PLT 142* 156 134* 190   Basic Metabolic Panel: Recent Labs  Lab 08/22/24 0418 08/22/24 0903 08/24/24 0318 08/25/24 0926  NA 135  --  133* 133*  K 4.1  --  4.6 5.2*  CL 103  --  103 101  CO2 19*  --  19* 24  GLUCOSE 105*  --  104* 85  BUN 44*  --  57* 70*  CREATININE 2.83* 2.89* 3.77* 3.78*  CALCIUM  9.5  --  9.2 9.6   GFR: Estimated Creatinine Clearance: 18.2 mL/min (A) (by C-G formula based on SCr of 3.78 mg/dL (H)). Liver Function Tests: Recent Labs  Lab 08/22/24 0418  AST 22  ALT 15  ALKPHOS 53  BILITOT 1.5*  PROT 6.3*  ALBUMIN  3.0*   No results for input(s): LIPASE, AMYLASE in the last 168 hours. No results for input(s): AMMONIA in the last 168 hours. Coagulation Profile: Recent Labs  Lab 08/22/24 0418  INR 1.6*   Cardiac Enzymes: No results for input(s): CKTOTAL, CKMB, CKMBINDEX, TROPONINI in the last 168 hours. BNP (last 3 results) No results for  input(s): PROBNP in the last 8760 hours. HbA1C: No results for input(s): HGBA1C in the last 72 hours. CBG: No results for input(s): GLUCAP in the last 168 hours. Lipid Profile: No results for input(s): CHOL, HDL, LDLCALC, TRIG, CHOLHDL, LDLDIRECT in the last 72 hours. Thyroid Function Tests: No results for input(s): TSH, T4TOTAL, FREET4, T3FREE, THYROIDAB in the last 72 hours. Anemia Panel: No results for input(s): VITAMINB12, FOLATE, FERRITIN, TIBC, IRON, RETICCTPCT in the last 72 hours. Sepsis Labs: Recent Labs  Lab 08/22/24 0903  PROCALCITON 0.44    Recent Results (from the past 240 hours)  Body fluid culture w Gram Stain     Status: None (Preliminary result)   Collection Time: 08/22/24 12:22 PM   Specimen: Lung, Right; Pleural Fluid  Result Value Ref Range Status   Specimen Description PLEURAL  Final   Special Requests RIGHT LUNG  Final   Gram Stain   Final    RARE WBC PRESENT, PREDOMINANTLY PMN NO ORGANISMS SEEN    Culture   Final    NO GROWTH 2 DAYS Performed at [facility], [address] [place], [place] [address]    Report Status PENDING  Incomplete         Radiology Studies: DG CHEST PORT 1 VIEW Result Date: 08/24/2024 CLINICAL DATA:  Follow-up right pleural effusion. EXAM: PORTABLE CHEST 1 VIEW COMPARISON:  08/22/2024 FINDINGS: The heart size and mediastinal contours are within normal limits. Pacemaker again noted. Small to moderate right pleural effusion and right lower lung atelectasis or infiltrate show no significant change. No pneumothorax visualized. Left lung remains grossly clear. IMPRESSION: No significant change in right pleural effusion and right lower lung atelectasis or infiltrate. Electronically Signed   By: Norleen DELENA Kil M.D.   On: 08/24/2024 07:43        Scheduled Meds:  acetaminophen   650 mg Oral Q6H   amiodarone   200 mg Oral Daily   atorvastatin   40 mg Oral Daily   enoxaparin  (LOVENOX )  injection  30 mg Subcutaneous Q24H   polyethylene glycol  17 g Oral Daily   Continuous Infusions:   LOS: 3 days    Time spent: 45 minutes spent on chart review, discussion with nursing staff, consultants, updating family and interview/physical exam; more than 50% of that time was spent in counseling and/or coordination of care.    Harlene RAYMOND Bowl, DO [facility] Available via Epic secure chat 7am-7pm After these hours, please refer to coverage provider listed on [email or web address] 08/25/2024, 12:03 PM

## 2024-08-25 NOTE — Consult Note (Signed)
 Cardiology Consultation   Patient ID: Dean Lampert Sr. MRN: [id number]; DOB: 03-21-1951  Admit date: 08/22/2024 Date of Consult: 08/25/2024  PCP:  [name], MD    [facility] Providers Cardiologist:  Vinie JAYSON Maxcy, MD  Cardiology APP:  Jerilynn Lamarr HERO, NP  Electrophysiologist:  [name], MD    Patient Profile: Dean Slinker Sr. is a 73 y.o. 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 Juvenal.  History of Present Illness: Dean [name] is a 73 year old male with past medical history noted above.  Cardiac history dates back to 2017 when he suffered a VT/VF arrest requiring CPR and defibrillation.  Underwent cardiac catheterization at that time which demonstrated 90% ostial RCA and 100% mid to distal RCA with collaterals which was treated medically.  Echocardiogram with LVEF of 35 to 40% and akinesis of basal inferior and apical myocardium.  Underwent single chamber Med[employer]onic ICD 09/2016 and since then EF has mostly been in the range of 30 to 35%.  Admitted 08/2022 secondary to ICD shock for VT/VF in the setting of amiodarone  noncompliance.  Admitted 10/2023 with a GI bleed requiring transfusion after polypectomy and hot snare with clips.  Admitted 04/2024 with low output heart failure and right heart cath demonstrated severely elevated filling pressure and reduced cardiac index requiring IV milrinone  and diuretics.  Outpatient TEE 05/2024 with moderate MR by PASA method and systolic blunting of pulmonary veins, moderately reduced LV EF and no left atrial appendage thrombus was severe lead related TR.  Admitted 06/2024 with heart failure requiring milrinone .  Notes indicate GDMT limited as unable to use SGLT2 with recent to UTI.  Beta-blocker was deferred  given concern for low output.  Creatinine peaked at 2.7 that admission likely cardiorenal.  CT chest with posterior right pulmonary nodule new from prior study with possible cirrhosis and hepatic lesions concerning for questionable metastatic disease.  He was seen by palliative care that admission and transition to DNR.  Presented to the ED on 11/28 after a fall from his wheelchair.  Found to have an AKI and acute T6 fracture.  Neurosurgery was consulted with recommendations for TLSO brace and outpatient follow-up.  Chest x-ray 11/28 with moderate right pleural effusion s/p thoracentesis (bloody) Eliquis  was held. U R pleural effusion unchanged on repeat chest x-ray 11/30. Worsening Cr 2.83>>3.77>>3.78 torsemide  has been held. Cardiology now asked to evaluate.   In talking with patient, he reports being compliant with his medications but states that he started noticing increasing lower extremity edema several weeks prior to admission.  States that his weight was also increasing despite taking his diuretic.  Denies any orthopnea or PND.  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 09/2016 s/p MDT Visia AF MRI single lead ICD (ser # [id number]).   Arthritis    maybe in his spine (09/05/2016)   Asthma    pt. denies   Cardiac arrest (HCC)    a. 08/2016 VT/VF Arrest-->09/2016 s/p MDT Visia AF MRI single lead ICD (ser # [id number]).   Chronic HFrEF (heart failure with reduced ejection fraction) (HCC)    a. 08/2016 Echo: EF 35-40%; b. 04/2018 Echo: EF 30-35%; c. 04/2020 Echo: EF 30-35%; d. 11/2021 Echo: EF 40-45%, GrI DD, nl RV fxn, sev dil LA,  mild-mod MR, mild-mod AI, Ao root 38mm.   Coronary artery disease    a. 08/2016 VT Arrest/Cath: LM nl, LAD nl, LCX nl, RGCA 90p, 100d w/ L->R collats, EF 35-45%.   Gout    Hyperlipidemia LDL goal <70    Hypertension    Ischemic cardiomyopathy    a. 08/2016 Echo: EF 35-40%; b. 04/2018 Echo: EF 30-35%;  c. 04/2020 Echo: EF 30-35%; d. 11/2021 Echo: EF 40-45%.   LBBB (left bundle branch block)    OSA on CPAP    does not wear CPAP   PAF (paroxysmal atrial fibrillation) (HCC)    a. CHA2DS2VASc = 5-->Eliquis .   Type II diabetes mellitus (HCC)     Past Surgical History:  Procedure Laterality Date   CARDIAC CATHETERIZATION N/A 09/07/2016   Procedure: Left Heart Cath and Coronary Angiography;  Surgeon: Peter M Jordan, MD;  Location: [facility] INVASIVE CV LAB;  Service: Cardiovascular;  Laterality: N/A;   CENTRAL LINE INSERTION  05/06/2024   Procedure: CENTRAL LINE INSERTION;  Surgeon: Cherrie Toribio SAUNDERS, MD;  Location: [facility] INVASIVE CV LAB;  Service: Cardiovascular;;   COLONOSCOPY WITH PROPOFOL  N/A 08/30/2018   Procedure: COLONOSCOPY WITH PROPOFOL ;  Surgeon: Rollin Dover, MD;  Location: [facility];  Service: Endoscopy;  Laterality: N/A;   COLONOSCOPY WITH PROPOFOL  N/A 11/16/2023   Procedure: COLONOSCOPY WITH PROPOFOL ;  Surgeon: Rollin Dover, MD;  Location: [facility];  Service: Gastroenterology;  Laterality: N/A;   COLONOSCOPY WITH PROPOFOL  N/A 11/18/2023   Procedure: COLONOSCOPY WITH PROPOFOL ;  Surgeon: Charlanne Groom, MD;  Location: [facility];  Service: Gastroenterology;  Laterality: N/A;   EP IMPLANTABLE DEVICE N/A 10/10/2016   Procedure: ICD Implant;  Surgeon: Will Gladis Norton, MD;  Location: [facility] INVASIVE CV LAB;  Service: Cardiovascular;  Laterality: N/A;   HEMOSTASIS CLIP PLACEMENT  11/16/2023   Procedure: HEMOSTASIS CLIP PLACEMENT;  Surgeon: Rollin Dover, MD;  Location: [facility];  Service: Gastroenterology;;   HEMOSTASIS CLIP PLACEMENT  11/18/2023   Procedure: HEMOSTASIS CLIP PLACEMENT;  Surgeon: Charlanne Groom, MD;  Location: [facility];  Service: Gastroenterology;;   HEMOSTASIS CONTROL  11/18/2023   Procedure: HEMOSTASIS CONTROL;  Surgeon: Charlanne Groom, MD;  Location: [facility];  Service: Gastroenterology;;  PuraStat   LUMBAR LAMINECTOMY/DECOMPRESSION MICRODISCECTOMY N/A 01/18/2018    Procedure: L2-3, L3-4, L-4-5 CENTRAL DECOMPRESSION;  Surgeon: Barbarann Oneil BROCKS, MD;  Location: [facility] OR;  Service: Orthopedics;  Laterality: N/A;   LUMBAR LAMINECTOMY/DECOMPRESSION MICRODISCECTOMY N/A 08/12/2020   Procedure: THORACIC ELEVEN LAMINECTOMY;  Surgeon: Louis Shove, MD;  Location: [facility] OR;  Service: Neurosurgery;  Laterality: N/A;   POLYPECTOMY  08/30/2018   Procedure: POLYPECTOMY;  Surgeon: Rollin Dover, MD;  Location: [facility];  Service: Endoscopy;;   POLYPECTOMY  11/16/2023   Procedure: POLYPECTOMY;  Surgeon: Rollin Dover, MD;  Location: [facility];  Service: Gastroenterology;;   RIGHT HEART CATH N/A 05/06/2024   Procedure: RIGHT HEART CATH;  Surgeon: Cherrie Toribio SAUNDERS, MD;  Location: [facility] INVASIVE CV LAB;  Service: Cardiovascular;  Laterality: N/A;   TRANSESOPHAGEAL ECHOCARDIOGRAM (CATH LAB) N/A 06/09/2024   Procedure: TRANSESOPHAGEAL ECHOCARDIOGRAM;  Surgeon: [name] Morene PARAS, MD;  Location: [facility] INVASIVE CV LAB;  Service: Cardiovascular;  Laterality: N/A;    Scheduled Meds:  acetaminophen   650 mg Oral Q6H   amiodarone   200 mg Oral Daily   atorvastatin   40 mg Oral Daily   enoxaparin  (LOVENOX ) injection  30 mg Subcutaneous Q24H   polyethylene glycol  17 g Oral Daily   Continuous Infusions:  PRN Meds: HYDROmorphone  (DILAUDID ) injection, methocarbamol ,  oxyCODONE   Allergies:    Allergies  Allergen Reactions   Contrast Media [Iodinated Contrast Media] Shortness Of Breath and Nausea And Vomiting   Lisinopril  Cough    Social History:   Social History   Socioeconomic History   Marital status: Widowed    Spouse name: Not on file   Number of children: Not on file   Years of education: Not on file   Highest education level: Not on file  Occupational History   Occupation: semi-retired proofreader  Tobacco Use   Smoking status: Never   Smokeless tobacco: Never  Vaping Use   Vaping status: Never Used  Substance and Sexual Activity   Alcohol use: No   Drug use: No   Sexual  activity: Not on file  Other Topics Concern   Not on file  Social History Narrative   Lives with daughters.    Social Drivers of Corporate Investment Banker Strain: Not on file  Food Insecurity: Patient Declined (08/22/2024)   Hunger Vital Sign    Worried About Running Out of Food in the Last Year: Patient declined    Ran Out of Food in the Last Year: Patient declined  Transportation Needs: Patient Declined (08/22/2024)   PRAPARE - Administrator, Civil Service (Medical): Patient declined    Lack of Transportation (Non-Medical): Patient declined  Physical Activity: Not on file  Stress: Not on file  Social Connections: Unknown (08/22/2024)   Social Connection and Isolation Panel    Frequency of Communication with Friends and Family: Patient declined    Frequency of Social Gatherings with Friends and Family: Patient declined    Attends Religious Services: Patient declined    Database Administrator or Organizations: Not on file    Attends Banker Meetings: Patient declined    Marital Status: Patient declined  Intimate Partner Violence: Patient Declined (08/22/2024)   Humiliation, Afraid, Rape, and Kick questionnaire    Fear of Current or Ex-Partner: Patient declined    Emotionally Abused: Patient declined    Physically Abused: Patient declined    Sexually Abused: Patient declined    Family History:    Family History  Problem Relation Age of Onset   Heart attack Father        died in his 80's   Hypertension Sister    Cancer Brother        uncertain, leg   Hypertension Sister      ROS:  Please see the history of present illness.   All other ROS reviewed and negative.     Physical Exam/Data: Vitals:   08/25/24 0500 08/25/24 0836 08/25/24 0838 08/25/24 1100  BP:  (!) 117/50 (!) 126/42 (!) 124/44  Pulse:  84 78 70  Resp:  (!) 23 20 14   Temp:   (!) 97.5 F (36.4 C)   TempSrc:   Oral   SpO2:  98% 100% 100%  Weight: 91.9 kg     Height:         Intake/Output Summary (Last 24 hours) at 08/25/2024 1118 Last data filed at 08/25/2024 0900 Gross per 24 hour  Intake 780 ml  Output 250 ml  Net 530 ml      08/25/2024    5:00 AM 08/22/2024    2:59 AM 07/19/2024    5:00 AM  Last 3 Weights  Weight (lbs) 202 lb 9.6 oz 185 lb 185 lb 4.8 oz  Weight (kg) 91.9 kg 83.915 kg 84.052 kg  Body mass index is 32.7 kg/m.  General:  Well nourished, well developed, in no acute distress HEENT: normal Neck: no significant JVD sitting upright in chair 2/2 TLSO brace  Vascular: No carotid bruits; Distal pulses 2+ bilaterally Cardiac:  normal S1, S2; RRR; + systolic murmur  Lungs:  limited exam 2/2 TLSO brace  Abd: soft, nontender, no hepatomegaly  Ext: 3++ pitting bilateral LE edema with weeping from right leg Musculoskeletal:  No deformities, BUE and BLE strength normal and equal Skin: warm and dry  Neuro:  no focal abnormalities noted  EKG:  The EKG was personally reviewed and demonstrates:  No tracing this admission  Telemetry:  Telemetry was personally reviewed and demonstrates:  sinus rhythm rates 70s  Relevant CV Studies:  TTE: 04/2024  IMPRESSIONS     1. Left ventricular ejection fraction, by estimation, is 35 to 40%. The  left ventricle has moderately decreased function. The left ventricle  demonstrates global hypokinesis. There is mild concentric left ventricular  hypertrophy. Left ventricular  diastolic parameters are consistent with Grade II diastolic dysfunction  (pseudonormalization).   2. Right ventricular systolic function is mildly reduced. The right  ventricular size is mildly enlarged. There is moderately elevated  pulmonary artery systolic pressure. The estimated right ventricular  systolic pressure is 58.4 mmHg.   3. Left atrial size was severely dilated.   4. Right atrial size was moderately dilated.   5. The posterior leaflet is restricted. The mitral valve is abnormal.  Moderate to severe mitral valve  regurgitation. No evidence of mitral  stenosis. Moderate mitral annular calcification.   6. Tricuspid valve regurgitation is moderate.   7. The aortic valve is bicuspid. There is moderate calcification of the  aortic valve. Aortic valve regurgitation is mild. No aortic stenosis is  present.   8. The inferior vena cava is dilated in size with >50% respiratory  variability, suggesting right atrial pressure of 8 mmHg.   FINDINGS   Left Ventricle: Left ventricular ejection fraction, by estimation, is 35  to 40%. The left ventricle has moderately decreased function. The left  ventricle demonstrates global hypokinesis. Definity  contrast agent was  given IV to delineate the left  ventricular endocardial borders. The left ventricular internal cavity size  was normal in size. There is mild concentric left ventricular hypertrophy.  Left ventricular diastolic parameters are consistent with Grade II  diastolic dysfunction  (pseudonormalization).     LV Wall Scoring:  The posterior wall is akinetic. The inferior wall is hypokinetic.   Right Ventricle: The right ventricular size is mildly enlarged. No  increase in right ventricular wall thickness. Right ventricular systolic  function is mildly reduced. There is moderately elevated pulmonary artery  systolic pressure. The tricuspid  regurgitant velocity is 3.55 m/s, and with an assumed right atrial  pressure of 8 mmHg, the estimated right ventricular systolic pressure is  58.4 mmHg.   Left Atrium: Left atrial size was severely dilated.   Right Atrium: Right atrial size was moderately dilated.   Pericardium: There is no evidence of pericardial effusion.   Mitral Valve: The posterior leaflet is restricted. The mitral valve is  abnormal. There is mild calcification of the mitral valve leaflet(s).  Moderate mitral annular calcification. Moderate to severe mitral valve  regurgitation, with centrally-directed  jet. No evidence of mitral valve  stenosis.   Tricuspid Valve: The tricuspid valve is normal in structure. Tricuspid  valve regurgitation is moderate . No evidence of tricuspid stenosis.   Aortic Valve: The aortic  valve is bicuspid. There is moderate  calcification of the aortic valve. Aortic valve regurgitation is mild. No  aortic stenosis is present. Aortic valve mean gradient measures 10.0 mmHg.  Aortic valve peak gradient measures 17.8  mmHg. Aortic valve area, by VTI measures 2.96 cm.   Pulmonic Valve: The pulmonic valve was normal in structure. Pulmonic valve  regurgitation is not visualized. No evidence of pulmonic stenosis.   Aorta: The aortic root is normal in size and structure.   Venous: The inferior vena cava is dilated in size with greater than 50%  respiratory variability, suggesting right atrial pressure of 8 mmHg.   IAS/Shunts: No atrial level shunt detected by color flow Doppler.   Additional Comments: A device lead is visualized.   TEE: 05/2024  KEY FINDINGS:   Moderate MR by PISA method with systolic blunting of the pulmonary veins,  no reversal  Short posterior leaflet, poor coaptation  Moderately reduced LVEF  No LAA thromubs  Severe lead related TR   [name]  Advanced Heart Failure  9:03 AM   Laboratory Data: High Sensitivity Troponin:  No results for input(s): TROPONINIHS in the last 720 hours.   Chemistry Recent Labs  Lab 08/22/24 0418 08/22/24 0903 08/24/24 0318 08/25/24 0926  NA 135  --  133* 133*  K 4.1  --  4.6 5.2*  CL 103  --  103 101  CO2 19*  --  19* 24  GLUCOSE 105*  --  104* 85  BUN 44*  --  57* 70*  CREATININE 2.83* 2.89* 3.77* 3.78*  CALCIUM  9.5  --  9.2 9.6  GFRNONAA 23* 22* 16* 16*  ANIONGAP 13  --  11 8    Recent Labs  Lab 08/22/24 0418  PROT 6.3*  ALBUMIN  3.0*  AST 22  ALT 15  ALKPHOS 53  BILITOT 1.5*   Lipids No results for input(s): CHOL, TRIG, HDL, LABVLDL, LDLCALC, CHOLHDL in the last 168 hours.  Hematology Recent  Labs  Lab 08/22/24 0903 08/24/24 0318 08/25/24 0926  WBC 8.4 6.2 6.7  RBC 3.05* 2.77* 3.03*  HGB 9.0* 8.2* 8.8*  HCT 28.3* 25.5* 28.4*  MCV 92.8 92.1 93.7  MCH 29.5 29.6 29.0  MCHC 31.8 32.2 31.0  RDW 14.6 14.6 14.7  PLT 156 134* 190   Thyroid No results for input(s): TSH, FREET4 in the last 168 hours.  BNP Recent Labs  Lab 08/22/24 0903  BNP 525.0*    DDimer No results for input(s): DDIMER in the last 168 hours.  Radiology/Studies:  DG CHEST PORT 1 VIEW Result Date: 08/24/2024 CLINICAL DATA:  Follow-up right pleural effusion. EXAM: PORTABLE CHEST 1 VIEW COMPARISON:  08/22/2024 FINDINGS: The heart size and mediastinal contours are within normal limits. Pacemaker again noted. Small to moderate right pleural effusion and right lower lung atelectasis or infiltrate show no significant change. No pneumothorax visualized. Left lung remains grossly clear. IMPRESSION: No significant change in right pleural effusion and right lower lung atelectasis or infiltrate. Electronically Signed   By: Norleen DELENA Kil M.D.   On: 08/24/2024 07:43   CT CHEST WO CONTRAST Result Date: 08/22/2024 CLINICAL DATA:  Status post fall with right pleural effusion and shortness of breath EXAM: CT CHEST WITHOUT CONTRAST TECHNIQUE: Multidetector CT imaging of the chest was performed following the standard protocol without IV contrast. RADIATION DOSE REDUCTION: This exam was performed according to the departmental dose-optimization program which includes automated exposure control, adjustment of the mA and/or kV according to patient size and/or  use of iterative reconstruction technique. COMPARISON:  Earlier same day CT chest FINDINGS: Cardiovascular: Left chest wall ICD lead terminates in the right ventricle. Multichamber cardiomegaly. No significant pericardial fluid/thickening. Dilated main pulmonary artery measures 3.8 cm. Coronary artery calcifications and aortic atherosclerosis. Mediastinum/Nodes: Imaged thyroid  gland without nodules meeting criteria for imaging follow-up by size. Mildly patulous esophagus. Left supraclavicular lymph node measures 10 mm (3:19). Unchanged prominent multi station mediastinal lymph nodes. Lungs/Pleura: The central airways are patent. Diffuse mosaic attenuation. Essentially unchanged appearance compared to earlier same day CT chest. Similar atelectasis/consolidation involving the right upper, middle, and lower lobes. No pneumothorax. Unchanged large, mildly hyperattenuating right pleural effusion. Upper abdomen: Please see separately dictated earlier same day CT abdomen and pelvis report for detailed findings. Musculoskeletal: Thoracic spine fracture, better evaluated on same-day CT thoracic spine. Multilevel degenerative changes of the thoracic spine. Flowing anterior osteophytes over at least 4 contiguous thoracic vertebrae which can be seen in the setting of diffuse idiopathic skeletal hyperostosis (DISH). Unchanged wedging of T10 and T11. IMPRESSION: 1. Essentially unchanged appearance when compared to earlier same day chest CT. 2. Unchanged large, mildly hyperattenuating right pleural effusion, in keeping with hemothorax noted on subsequent thoracentesis. 3. Similar atelectasis/consolidation involving the right upper, middle, and lower lobes. 4. Diffuse mosaic attenuation, which can be seen in the setting of small airways disease. 5. Dilated main pulmonary artery, which can be seen in the setting of pulmonary arterial hypertension. 6. Unchanged prominent multi station mediastinal lymph nodes and left supraclavicular lymph node, likely reactive. 7.  Aortic Atherosclerosis (ICD10-I70.0). Electronically Signed   By: Limin  Xu M.D.   On: 08/22/2024 16:47   DG Chest 1 View Result Date: 08/22/2024 CLINICAL DATA:  Status post right thoracentesis. EXAM: CHEST  1 VIEW COMPARISON:  None Available. FINDINGS: Stable mild cardiomegaly.  AICD remains in place. Moderate right pleural effusion and  right lower lung atelectasis or infiltrate is seen. No pneumothorax visualized. IMPRESSION: No pneumothorax visualized. Moderate right pleural effusion and right lower lung atelectasis or infiltrate. Electronically Signed   By: Norleen DELENA Kil M.D.   On: 08/22/2024 12:56   CT L-SPINE NO CHARGE Result Date: 08/22/2024 EXAM: CT CHEST ABDOMEN PELVIS WITH THORACIC AND LUMBAR SPINE RECONSTRUCTIONS 08/22/2024 03:44:00 AM TECHNIQUE: CT of the chest, abdomen, pelvis was performed without the administration of intravenous contrast. Multiplanar reformatted images are provided for review, including reconstructed images of the thoracic and lumbar spine. Automated exposure control, iterative reconstruction, and/or weight based adjustment of the mA/kV was utilized to reduce the radiation dose to as low as reasonably achievable. COMPARISON: Portable chest 07/10/2024, chest CT without contrast 07/10/2024, chest CT high resolution study 09/08/2019, abdomen and pelvis CT without contrast 05/04/2024, abdomen and pelvis CT without contrast 04/01/2024. CLINICAL HISTORY: Polytrauma, blunt. Patient fell down an unknown number of steps from his wheelchair. FINDINGS: CT CHEST: THORACIC AORTA: The aorta and great vessels are moderate to heavily calcified. No acute traumatic injury of the aorta. MEDIASTINUM: Single lead left chest pacemaker/aid, stable right ventricular wire insertion. The heart is moderately enlarged. No pericardial effusion. Left main and patchy 3-vessel coronary calcifications. Mild central pulmonary venous distention again noted with prominent pulmonary trunk measuring 3.4 cm, indicating arterial hypertension. A 1.2 cm precarinal lymph node to the right is again noted with similar sized left-sided prevascular nodes also again seen. Patulous esophagus with normal wall thickness. The trachea and main bronchi are patent. There is mild diffuse bronchial thickening. No mediastinal hematoma or pneumomediastinum. No acute  traumatic injury  to the heart or pericardium. LUNGS: Interval increased moderate sized right pleural effusion. Most of this does layer posteriorly, but some of it could be loculated in the chest base anteriorly. The density of the right pleural effusion is greater than simple fluid, measuring up to 33 Hounsfield units. A component of pleural hemorrhage or proteinaceous fluid such as due to infection or empyema, is not excluded. There is no air in the pleural fluid and no pneumothorax. There is consolidation versus compressive collapse over a majority of the right middle and lower lobes. Underlying pneumonia is a consideration. Nodules previously newly identified in the base of the right upper lobe and in the right lower lobe, would be obscured by the pleural fluid if still present. Mosaic attenuation of the lungs continues to be seen, consistent with air trapping and small airway disease. The lungs are otherwise clear. No acute traumatic injury to the lungs. No pulmonary contusion or laceration. There previously was a small left pleural effusion, which has cleared. CHEST WALL: No acute displaced rib fracture. No chest wall hematoma. No chest wall mass. CT ABDOMEN AND PELVIS: ABDOMINAL AORTA: Heavy aortic and branch vessel atherosclerosis without aneurysm. No acute traumatic injury of the aorta or iliac arteries. HEPATOBILIARY: Hypoattenuating lesions in the dome of the liver, measuring 4.6 x 3.9 x 2 cm in segments 8 and 2 have been previously characterized as hemangiomas on a study of 09/19/2018. There have been no significant interval changes. The liver is dense, which could be seen with amiodarone  or heavy metal toxicity as well as hemochromatosis. There is cholelithiasis without gallbladder wall thickening or biliary dilatation. No acute liver abnormality is seen without contrast. SPLEEN: No acute traumatic injury. PANCREAS: No acute traumatic injury. ADRENAL GLANDS: No adrenal hemorrhage is seen. No acute  traumatic injury. KIDNEYS: No renal or perinephric hemorrhage is seen. There is chronic perinephric stranding. No urinary stone or obstruction. No contour deforming mass of the unenhanced kidneys is seen. No acute traumatic injury. No hydronephrosis. GI TRACT: There is no bowel obstruction or inflammation. The gastric wall is unremarkable. The appendix is not seen. There is advanced sigmoid diverticulosis without diverticulitis. Small fat-containing umbilical hernia. No acute traumatic injury of the bowel. PERITONEUM: No ascites or free air. No free fluid or free hemorrhage. RETROPERITONEUM: No retroperitoneal hematoma. BLADDER: The bladder is thickened but also not fully distended. Correlate clinically for cystitis. REPRODUCTIVE ORGANS: There are dystrophic calcifications within a mildly enlarged prostate gland. BONES: No acute traumatic fracture of the pelvis. 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 No acute regional skeletal fracture at the levels of the abdomen and pelvis. SOFT TISSUES: There is increased skin thickening and subcutaneous stranding over the low anterior abdominal wall, which could be congestive or due to cellulitis. Mild anasarca is seen in portions of the remaining body wall. THORACIC AND LUMBAR SPINE: BONES AND ALIGNMENT: *   **Thoracic Spine:** * Acute oblique nondisplaced fracture through the mid to anterior T6 vertebral body extending through bridging enthesopathic bone, without evidence of fracture transmission into the posterior elements and pedicles. There is no loss of vertebral height at T6. * Broad-based mid to lower thoracic kyphosis centered at T10-T11, where there is again noted bulky bridging bone anteriorly between the segments, moderate wedging of both segments, with interbody ankylosis consistent with old injury, with old spinous process removal at both levels with dorsal bone graft fusion. . * The rest of the thoracic spine also has extensive  bridging enthesopathy beginning  at T2, resulting in multilevel interbody fusions. This is eccentric to the right anterolateral aspect. There is ankylosis over multiple spinous processes. Facet joints are also fused at multiple levels with facet hypertrophy. * Above and below T10 and T11 the thoracic vertebrae are normal in height. There is osteopenia. *   **Lumbar Spine:** *   No acute fracture is evident. * Chronic grade 1 degenerative anterolisthesis at L4-L5 without other abnormal alignment. * Advanced lumbar marginal osteophytosis with bulky bridging osteophytes T12-L1, L1-L2, L2-L3, and L4-L5 with attempted bridging osteophytes at L3-L4 and L5-S1. * The discs are of normal height apart from chronic disc space widening at L3-L4. Thank you *   Advanced facet hypertrophy most levels. * The spinous processes were previously removed at L2, L3, and L4 with spinous process shaving at L5. DEGENERATIVE CHANGES: * **Thoracic Spine:** Varying degrees of multilevel acquired foraminal stenosis, but this is most severe on the left at T1-T2, on the right at T4-T5, on the right greater than left at T10-T11, on the right at T11-T12. Posterior endplate ridging at the level of T10-T11 does flatten the ventral thecal sac and cause 7 mm thecal sac AP stenosis and probable mild flattening of the ventral cord without frank impression. Other levels do not show noteworthy soft tissue or bony encroachment on the thecal sac. *   **Lumbar Spine:** Acquired spinal stenosis is noted all levels except L1-L2, due to ligamentous facet hypertrophy and posterior endplate ridging, but it is most severe at L3-L4 and L4-L5. Foraminal stenosis is moderate to severe bilaterally from L2-L3 through L4-L5. SOFT TISSUES: * **Thoracic Spine:** Mild edema in the adjacent soft tissues at T6, but there is no appreciable spinal canal hematoma. No paraspinal mass or hematoma. *   **Lumbar Spine:** No paraspinal mass or hematoma. IMPRESSION: 1. Acute oblique  nondisplaced mid to anterior T6 vertebral body fracture extending through bridging enthesopathic bone without posterior element involvement, no vertebral height loss, with mild adjacent soft tissue edema and no appreciable spinal canal hematoma. 2. Interval increased moderate-sized right pleural effusion demonstrating increased attenuation suggesting hemorrhagic or proteinaceous content; no pneumothorax. The rib cage is intact . 3. Right middle and lower lobe consolidation versus compressive atelectasis, with pneumonia and secondary empyema a consideration. 4. 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 5. Severe lumbar spinal stenosis at L3-L4 and L4-L5 with moderate to severe bilateral foraminal stenosis from L2-3 through L4-5. 6. Enlarged prostate. 7. Dense liver suggesting metallic or amiodarone  toxicity. 8. Cholelithiasis. 9. Cardiomegaly with aortic and coronary atherosclerosis. 10. Body wall edema, with increased skin thickening and stranding in the lower end of the ear canal which could represent cerumen or mastoid cellulitis. 11. Advanced sigmoid diverticulosis and additional detailed findings discussed above. Electronically signed by: Francis Quam MD 08/22/2024 05:00 AM EST RP Workstation: [id number]   CT T-SPINE NO CHARGE Result Date: 08/22/2024 EXAM: CT CHEST ABDOMEN PELVIS WITH THORACIC AND LUMBAR SPINE RECONSTRUCTIONS 08/22/2024 03:44:00 AM TECHNIQUE: CT of the chest, abdomen, pelvis was performed without the administration of intravenous contrast. Multiplanar reformatted images are provided for review, including reconstructed images of the thoracic and lumbar spine. Automated exposure control, iterative reconstruction, and/or weight based adjustment of the mA/kV was utilized to reduce the  radiation dose to as low as reasonably achievable. COMPARISON: Portable chest 07/10/2024, chest CT without contrast 07/10/2024, chest CT  high resolution study 09/08/2019, abdomen and pelvis CT without contrast 05/04/2024, abdomen and pelvis CT without contrast 04/01/2024. CLINICAL HISTORY: Polytrauma, blunt. Patient fell down an unknown number of steps from his wheelchair. FINDINGS: CT CHEST: THORACIC AORTA: The aorta and great vessels are moderate to heavily calcified. No acute traumatic injury of the aorta. MEDIASTINUM: Single lead left chest pacemaker/aid, stable right ventricular wire insertion. The heart is moderately enlarged. No pericardial effusion. Left main and patchy 3-vessel coronary calcifications. Mild central pulmonary venous distention again noted with prominent pulmonary trunk measuring 3.4 cm, indicating arterial hypertension. A 1.2 cm precarinal lymph node to the right is again noted with similar sized left-sided prevascular nodes also again seen. Patulous esophagus with normal wall thickness. The trachea and main bronchi are patent. There is mild diffuse bronchial thickening. No mediastinal hematoma or pneumomediastinum. No acute traumatic injury to the heart or pericardium. LUNGS: Interval increased moderate sized right pleural effusion. Most of this does layer posteriorly, but some of it could be loculated in the chest base anteriorly. The density of the right pleural effusion is greater than simple fluid, measuring up to 33 Hounsfield units. A component of pleural hemorrhage or proteinaceous fluid such as due to infection or empyema, is not excluded. There is no air in the pleural fluid and no pneumothorax. There is consolidation versus compressive collapse over a majority of the right middle and lower lobes. Underlying pneumonia is a consideration. Nodules previously newly identified in the base of the right upper lobe and in the right lower lobe, would be obscured by the pleural fluid if  still present. Mosaic attenuation of the lungs continues to be seen, consistent with air trapping and small airway disease. The lungs are otherwise clear. No acute traumatic injury to the lungs. No pulmonary contusion or laceration. There previously was a small left pleural effusion, which has cleared. CHEST WALL: No acute displaced rib fracture. No chest wall hematoma. No chest wall mass. CT ABDOMEN AND PELVIS: ABDOMINAL AORTA: Heavy aortic and branch vessel atherosclerosis without aneurysm. No acute traumatic injury of the aorta or iliac arteries. HEPATOBILIARY: Hypoattenuating lesions in the dome of the liver, measuring 4.6 x 3.9 x 2 cm in segments 8 and 2 have been previously characterized as hemangiomas on a study of 09/19/2018. There have been no significant interval changes. The liver is dense, which could be seen with amiodarone  or heavy metal toxicity as well as hemochromatosis. There is cholelithiasis without gallbladder wall thickening or biliary dilatation. No acute liver abnormality is seen without contrast. SPLEEN: No acute traumatic injury. PANCREAS: No acute traumatic injury. ADRENAL GLANDS: No adrenal hemorrhage is seen. No acute traumatic injury. KIDNEYS: No renal or perinephric hemorrhage is seen. There is chronic perinephric stranding. No urinary stone or obstruction. No contour deforming mass of the unenhanced kidneys is seen. No acute traumatic injury. No hydronephrosis. GI TRACT: There is no bowel obstruction or inflammation. The gastric wall is unremarkable. The appendix is not seen. There is advanced sigmoid diverticulosis without diverticulitis. Small fat-containing umbilical hernia. No acute traumatic injury of the bowel. PERITONEUM: No ascites or free air. No free fluid or free hemorrhage. RETROPERITONEUM: No retroperitoneal hematoma. BLADDER: The bladder is thickened but also not fully distended. Correlate clinically for cystitis. REPRODUCTIVE ORGANS: There are dystrophic calcifications  within a mildly enlarged prostate gland. BONES: No acute traumatic fracture of the pelvis. 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 No acute regional skeletal fracture at the levels of the abdomen and  pelvis. SOFT TISSUES: There is increased skin thickening and subcutaneous stranding over the low anterior abdominal wall, which could be congestive or due to cellulitis. Mild anasarca is seen in portions of the remaining body wall. THORACIC AND LUMBAR SPINE: BONES AND ALIGNMENT: *   **Thoracic Spine:** * Acute oblique nondisplaced fracture through the mid to anterior T6 vertebral body extending through bridging enthesopathic bone, without evidence of fracture transmission into the posterior elements and pedicles. There is no loss of vertebral height at T6. * Broad-based mid to lower thoracic kyphosis centered at T10-T11, where there is again noted bulky bridging bone anteriorly between the segments, moderate wedging of both segments, with interbody ankylosis consistent with old injury, with old spinous process removal at both levels with dorsal bone graft fusion. . * The rest of the thoracic spine also has extensive bridging enthesopathy beginning at T2, resulting in multilevel interbody fusions. This is eccentric to the right anterolateral aspect. There is ankylosis over multiple spinous processes. Facet joints are also fused at multiple levels with facet hypertrophy. * Above and below T10 and T11 the thoracic vertebrae are normal in height. There is osteopenia. *   **Lumbar Spine:** *   No acute fracture is evident. * Chronic grade 1 degenerative anterolisthesis at L4-L5 without other abnormal alignment. * Advanced lumbar marginal osteophytosis with bulky bridging osteophytes T12-L1, L1-L2, L2-L3, and L4-L5 with attempted bridging osteophytes at L3-L4 and L5-S1. * The discs are of normal height apart from chronic disc space widening at L3-L4. Thank you *   Advanced facet hypertrophy most  levels. * The spinous processes were previously removed at L2, L3, and L4 with spinous process shaving at L5. DEGENERATIVE CHANGES: * **Thoracic Spine:** Varying degrees of multilevel acquired foraminal stenosis, but this is most severe on the left at T1-T2, on the right at T4-T5, on the right greater than left at T10-T11, on the right at T11-T12. Posterior endplate ridging at the level of T10-T11 does flatten the ventral thecal sac and cause 7 mm thecal sac AP stenosis and probable mild flattening of the ventral cord without frank impression. Other levels do not show noteworthy soft tissue or bony encroachment on the thecal sac. *   **Lumbar Spine:** Acquired spinal stenosis is noted all levels except L1-L2, due to ligamentous facet hypertrophy and posterior endplate ridging, but it is most severe at L3-L4 and L4-L5. Foraminal stenosis is moderate to severe bilaterally from L2-L3 through L4-L5. SOFT TISSUES: * **Thoracic Spine:** Mild edema in the adjacent soft tissues at T6, but there is no appreciable spinal canal hematoma. No paraspinal mass or hematoma. *   **Lumbar Spine:** No paraspinal mass or hematoma. IMPRESSION: 1. Acute oblique nondisplaced mid to anterior T6 vertebral body fracture extending through bridging enthesopathic bone without posterior element involvement, no vertebral height loss, with mild adjacent soft tissue edema and no appreciable spinal canal hematoma. 2. Interval increased moderate-sized right pleural effusion demonstrating increased attenuation suggesting hemorrhagic or proteinaceous content; no pneumothorax. The rib cage is intact . 3. Right middle and lower lobe consolidation versus compressive atelectasis, with pneumonia and secondary empyema a consideration. 4. 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 5. Severe lumbar spinal stenosis at L3-L4 and L4-L5 with moderate to severe bilateral foraminal stenosis from L2-3 through L4-5. 6. Enlarged prostate. 7. Dense liver suggesting metallic or amiodarone  toxicity.  8. Cholelithiasis. 9. Cardiomegaly with aortic and coronary atherosclerosis. 10. Body wall edema, with increased skin thickening and stranding in the lower end of the ear canal which could represent cerumen or mastoid cellulitis. 11. Advanced sigmoid diverticulosis and additional detailed findings discussed above. Electronically signed by: Francis Quam MD 08/22/2024 05:00 AM EST RP Workstation: [id number]   CT CHEST ABDOMEN PELVIS WO CONTRAST Result Date: 08/22/2024 EXAM: CT CHEST ABDOMEN PELVIS WITH THORACIC AND LUMBAR SPINE RECONSTRUCTIONS 08/22/2024 03:44:00 AM TECHNIQUE: CT of the chest, abdomen, pelvis was performed without the administration of intravenous contrast. Multiplanar reformatted images are provided for review, including reconstructed images of the thoracic and lumbar spine. Automated exposure control, iterative reconstruction, and/or weight based adjustment of the mA/kV was utilized to reduce the radiation dose to as low as reasonably achievable. COMPARISON: Portable chest 07/10/2024, chest CT without contrast 07/10/2024, chest CT high resolution study 09/08/2019, abdomen and pelvis CT without contrast 05/04/2024, abdomen and pelvis CT without contrast 04/01/2024. CLINICAL HISTORY: Polytrauma, blunt. Patient fell down an unknown number of steps from his wheelchair. FINDINGS: CT CHEST: THORACIC AORTA: The aorta and great vessels are moderate to heavily calcified. No acute traumatic injury of the aorta. MEDIASTINUM: Single lead left chest pacemaker/aid, stable right ventricular wire insertion. The heart is moderately enlarged. No pericardial effusion. Left main and patchy 3-vessel coronary calcifications. Mild central pulmonary venous distention again noted  with prominent pulmonary trunk measuring 3.4 cm, indicating arterial hypertension. A 1.2 cm precarinal lymph node to the right is again noted with similar sized left-sided prevascular nodes also again seen. Patulous esophagus with normal wall thickness. The trachea and main bronchi are patent. There is mild diffuse bronchial thickening. No mediastinal hematoma or pneumomediastinum. No acute traumatic injury to the heart or pericardium. LUNGS: Interval increased moderate sized right pleural effusion. Most of this does layer posteriorly, but some of it could be loculated in the chest base anteriorly. The density of the right pleural effusion is greater than simple fluid, measuring up to 33 Hounsfield units. A component of pleural hemorrhage or proteinaceous fluid such as due to infection or empyema, is not excluded. There is no air in the pleural fluid and no pneumothorax. There is consolidation versus compressive collapse over a majority of the right middle and lower lobes. Underlying pneumonia is a consideration. Nodules previously newly identified in the base of the right upper lobe and in the right lower lobe, would be obscured by the pleural fluid if still present. Mosaic attenuation of the lungs continues to be seen, consistent with air trapping and small airway disease. The lungs are otherwise clear. No acute traumatic injury to the lungs. No pulmonary contusion or laceration. There previously was a small left pleural effusion, which has cleared. CHEST WALL: No acute displaced rib fracture. No chest wall hematoma. No chest wall mass. CT ABDOMEN AND PELVIS: ABDOMINAL AORTA: Heavy aortic and branch vessel atherosclerosis without aneurysm. No acute traumatic injury of the aorta or iliac arteries. HEPATOBILIARY: Hypoattenuating lesions in the dome of the liver, measuring 4.6 x 3.9 x 2 cm in segments 8 and 2 have been previously characterized as hemangiomas on a study of 09/19/2018. There have been no significant  interval changes. The liver is dense, which could be seen with amiodarone  or heavy metal toxicity as well as hemochromatosis. There is cholelithiasis without gallbladder wall thickening or biliary dilatation. No acute liver abnormality is seen without contrast. SPLEEN: No acute traumatic injury. PANCREAS: No acute traumatic injury. ADRENAL GLANDS: No adrenal hemorrhage is  seen. No acute traumatic injury. KIDNEYS: No renal or perinephric hemorrhage is seen. There is chronic perinephric stranding. No urinary stone or obstruction. No contour deforming mass of the unenhanced kidneys is seen. No acute traumatic injury. No hydronephrosis. GI TRACT: There is no bowel obstruction or inflammation. The gastric wall is unremarkable. The appendix is not seen. There is advanced sigmoid diverticulosis without diverticulitis. Small fat-containing umbilical hernia. No acute traumatic injury of the bowel. PERITONEUM: No ascites or free air. No free fluid or free hemorrhage. RETROPERITONEUM: No retroperitoneal hematoma. BLADDER: The bladder is thickened but also not fully distended. Correlate clinically for cystitis. REPRODUCTIVE ORGANS: There are dystrophic calcifications within a mildly enlarged prostate gland. BONES: No acute traumatic fracture of the pelvis. 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 No acute regional skeletal fracture at the levels of the abdomen and pelvis. SOFT TISSUES: There is increased skin thickening and subcutaneous stranding over the low anterior abdominal wall, which could be congestive or due to cellulitis. Mild anasarca is seen in portions of the remaining body wall. THORACIC AND LUMBAR SPINE: BONES AND ALIGNMENT: *   **Thoracic Spine:** * Acute oblique nondisplaced fracture through the mid to anterior T6 vertebral body extending through bridging enthesopathic bone, without evidence of fracture transmission into the posterior elements and pedicles. There is no loss of  vertebral height at T6. * Broad-based mid to lower thoracic kyphosis centered at T10-T11, where there is again noted bulky bridging bone anteriorly between the segments, moderate wedging of both segments, with interbody ankylosis consistent with old injury, with old spinous process removal at both levels with dorsal bone graft fusion. . * The rest of the thoracic spine also has extensive bridging enthesopathy beginning at T2, resulting in multilevel interbody fusions. This is eccentric to the right anterolateral aspect. There is ankylosis over multiple spinous processes. Facet joints are also fused at multiple levels with facet hypertrophy. * Above and below T10 and T11 the thoracic vertebrae are normal in height. There is osteopenia. *   **Lumbar Spine:** *   No acute fracture is evident. * Chronic grade 1 degenerative anterolisthesis at L4-L5 without other abnormal alignment. * Advanced lumbar marginal osteophytosis with bulky bridging osteophytes T12-L1, L1-L2, L2-L3, and L4-L5 with attempted bridging osteophytes at L3-L4 and L5-S1. * The discs are of normal height apart from chronic disc space widening at L3-L4. Thank you *   Advanced facet hypertrophy most levels. * The spinous processes were previously removed at L2, L3, and L4 with spinous process shaving at L5. DEGENERATIVE CHANGES: * **Thoracic Spine:** Varying degrees of multilevel acquired foraminal stenosis, but this is most severe on the left at T1-T2, on the right at T4-T5, on the right greater than left at T10-T11, on the right at T11-T12. Posterior endplate ridging at the level of T10-T11 does flatten the ventral thecal sac and cause 7 mm thecal sac AP stenosis and probable mild flattening of the ventral cord without frank impression. Other levels do not show noteworthy soft tissue or bony encroachment on the thecal sac. *   **Lumbar Spine:** Acquired spinal stenosis is noted all levels except L1-L2, due to ligamentous facet hypertrophy and posterior  endplate ridging, but it is most severe at L3-L4 and L4-L5. Foraminal stenosis is moderate to severe bilaterally from L2-L3 through L4-L5. SOFT TISSUES: * **Thoracic Spine:** Mild edema in the adjacent soft tissues at T6, but there is no appreciable spinal canal hematoma. No paraspinal mass or hematoma. *   **Lumbar  Spine:** No paraspinal mass or hematoma. IMPRESSION: 1. Acute oblique nondisplaced mid to anterior T6 vertebral body fracture extending through bridging enthesopathic bone without posterior element involvement, no vertebral height loss, with mild adjacent soft tissue edema and no appreciable spinal canal hematoma. 2. Interval increased moderate-sized right pleural effusion demonstrating increased attenuation suggesting hemorrhagic or proteinaceous content; no pneumothorax. The rib cage is intact . 3. Right middle and lower lobe consolidation versus compressive atelectasis, with pneumonia and secondary empyema a consideration. 4. 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 5. Severe lumbar spinal stenosis at L3-L4 and L4-L5 with moderate to severe bilateral foraminal stenosis from L2-3 through L4-5. 6. Enlarged prostate. 7. Dense liver suggesting metallic or amiodarone  toxicity. 8. Cholelithiasis. 9. Cardiomegaly with aortic and coronary atherosclerosis. 10. Body wall edema, with increased skin thickening and stranding in the lower end of the ear canal which could represent cerumen or mastoid cellulitis. 11. Advanced sigmoid diverticulosis and additional detailed findings discussed above. Electronically signed by: Francis Quam MD 08/22/2024 05:00 AM EST RP Workstation: [id number]     Assessment and Plan:  Dean Correia Sr. is a 73 y.o. 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 Juvenal.  Acute on Chronic systolic HF/ICM -- Historically, primarily ischemic given history of infarcted RCA -- TTE 04/2024 with LVEF 35 to 40%, RV mildly reduced with moderate to severe MR -- Reports issues with worsening lower extremity edema prior to admission despite being compliant with his diuretic. -- BNP 525, chest x-ray with right pleural effusion -- Torsemide  60 mg was held 11/29 in the setting of worsening creatinine -- Remains significantly volume overloaded with 3+ lower extremity pitting edema -- hx of low put HF admissions requiring milrinone .  Will check a lactic acid, LFTs. Review further with MD  -- GDMT: very limited, no BB, ARB held with worsening renal function. No GLT 2 with history of UTIs.  Right pleural effusion -- Chest x-ray on admission with moderate size right pleural effusion status post thoracentesis which was bloody.  Repeat chest x-ray yesterday unchanged  AKI on CKD stage IIIb -- Baseline creatinine around 2.5, up to 2.8 on admission.  Worsening to 3.7 after irbesartan  150mg . Cr flat at 3.78 today  -- suspect some component of cardiorenal syndrome -- Historically has not been on SGLT2's given history of UTI  Mitral regurgitation -- Secondary to ischemic disease.  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  Eliquis  currently held in the setting of bloody pleural effusion  History of VT/VF status post ICD  Per Primary Acute T6 fracture Ground-level  fall Physical deconditioning  Risk Assessment/Risk Scores:  [place]  Heart Association (NYHA) Functional Class NYHA Class III  CHA2DS2-VASc Score = 5   This indicates a 7.2% annual risk of stroke. The patient's score is based upon: CHF History: 1 HTN History: 1 Diabetes History:  1 Stroke History: 0 Vascular Disease History: 1 Age Score: 1 Gender Score: 0  For questions or updates, please contact [facility] Please consult [email or web address] for contact info under   Signed, Manuelita Rummer, NP  08/25/2024 11:18 AM

## 2024-08-26 ENCOUNTER — Inpatient Hospital Stay (HOSPITAL_COMMUNITY)

## 2024-08-26 DIAGNOSIS — T07XXXA Unspecified multiple injuries, initial encounter: Secondary | ICD-10-CM | POA: Diagnosis not present

## 2024-08-26 DIAGNOSIS — I5082 Biventricular heart failure: Secondary | ICD-10-CM | POA: Diagnosis not present

## 2024-08-26 DIAGNOSIS — J90 Pleural effusion, not elsewhere classified: Secondary | ICD-10-CM | POA: Diagnosis not present

## 2024-08-26 LAB — BASIC METABOLIC PANEL WITH GFR
Anion gap: 10 (ref 5–15)
BUN: 81 mg/dL — ABNORMAL HIGH (ref 8–23)
CO2: 20 mmol/L — ABNORMAL LOW (ref 22–32)
Calcium: 9.6 mg/dL (ref 8.9–10.3)
Chloride: 101 mmol/L (ref 98–111)
Creatinine, Ser: 4.32 mg/dL — ABNORMAL HIGH (ref 0.61–1.24)
GFR, Estimated: 14 mL/min — ABNORMAL LOW (ref >60)
Glucose, Bld: 113 mg/dL — ABNORMAL HIGH (ref 70–99)
Potassium: 5.1 mmol/L (ref 3.5–5.1)
Sodium: 131 mmol/L — ABNORMAL LOW (ref 135–145)

## 2024-08-26 LAB — CBC
HCT: 26.2 % — ABNORMAL LOW (ref 39.0–52.0)
Hemoglobin: 8.4 g/dL — ABNORMAL LOW (ref 13.0–17.0)
MCH: 29.5 pg (ref 26.0–34.0)
MCHC: 32.1 g/dL (ref 30.0–36.0)
MCV: 91.9 fL (ref 80.0–100.0)
Platelets: 200 K/uL (ref 150–400)
RBC: 2.85 MIL/uL — ABNORMAL LOW (ref 4.22–5.81)
RDW: 14.6 % (ref 11.5–15.5)
WBC: 7.7 K/uL (ref 4.0–10.5)
nRBC: 0 % (ref 0.0–0.2)

## 2024-08-26 LAB — LACTIC ACID, PLASMA: Lactic Acid, Venous: 1.6 mmol/L (ref 0.5–1.9)

## 2024-08-26 LAB — COOXEMETRY PANEL
Carboxyhemoglobin: 2.2 % — ABNORMAL HIGH (ref 0.5–1.5)
Methemoglobin: 0.8 % (ref 0.0–1.5)
O2 Saturation: 62.5 %
Total hemoglobin: 8.9 g/dL — ABNORMAL LOW (ref 12.0–16.0)

## 2024-08-26 LAB — CREATININE, SERUM
Creatinine, Ser: 5.01 mg/dL — ABNORMAL HIGH (ref 0.61–1.24)
GFR, Estimated: 11 mL/min — ABNORMAL LOW (ref >60)

## 2024-08-26 LAB — MAGNESIUM: Magnesium: 1.8 mg/dL (ref 1.7–2.4)

## 2024-08-26 MED ORDER — SODIUM ZIRCONIUM CYCLOSILICATE 10 G PO PACK
10.0000 g | PACK | Freq: Once | ORAL | Status: AC
  Administered 2024-08-26: 10 g via ORAL
  Filled 2024-08-26: qty 1

## 2024-08-26 MED ORDER — FUROSEMIDE 10 MG/ML IJ SOLN
80.0000 mg | Freq: Two times a day (BID) | INTRAMUSCULAR | Status: DC
  Administered 2024-08-26: 80 mg via INTRAVENOUS
  Filled 2024-08-26: qty 8

## 2024-08-26 MED ORDER — ALBUMIN HUMAN 25 % IV SOLN
25.0000 g | Freq: Once | INTRAVENOUS | Status: AC
  Administered 2024-08-26: 25 g via INTRAVENOUS
  Filled 2024-08-26: qty 100

## 2024-08-26 MED ORDER — DARBEPOETIN ALFA 40 MCG/0.4ML IJ SOSY
40.0000 ug | PREFILLED_SYRINGE | Freq: Once | INTRAMUSCULAR | Status: AC
  Administered 2024-08-26: 40 ug via SUBCUTANEOUS
  Filled 2024-08-26: qty 0.4

## 2024-08-26 MED ORDER — SODIUM CHLORIDE 0.9 % IV BOLUS
250.0000 mL | Freq: Once | INTRAVENOUS | Status: AC
  Administered 2024-08-26: 250 mL via INTRAVENOUS

## 2024-08-26 MED ORDER — ONDANSETRON HCL 4 MG/2ML IJ SOLN
4.0000 mg | Freq: Four times a day (QID) | INTRAMUSCULAR | Status: AC
  Administered 2024-08-26: 4 mg via INTRAVENOUS
  Filled 2024-08-26: qty 2

## 2024-08-26 NOTE — Plan of Care (Signed)
   Problem: Education: Goal: Knowledge of General Education information will improve Description: Including pain rating scale, medication(s)/side effects and non-pharmacologic comfort measures Outcome: Progressing   Problem: Health Behavior/Discharge Planning: Goal: Ability to manage health-related needs will improve Outcome: Progressing   Problem: Clinical Measurements: Goal: Will remain free from infection Outcome: Progressing

## 2024-08-26 NOTE — Progress Notes (Signed)
 DAILY PROGRESS NOTE   Patient Name: Dean Koeppen Sr. Date of Encounter: 08/26/2024 Cardiologist: Vinie JAYSON Maxcy, MD  Chief Complaint   No complaints  Patient Profile   Dean Schriefer Sr. is a 73 y.o. 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 Juvenal.   Subjective   No shortness of breath at rest - noted to have mildly reduced Co-ox yesterday of 59.2% - improved to 62.5% on milrinone . Creatinine worse today at 4.32 (from 3.78) - GFR 14. Lactate 1.8, then 1.5. Still has 3-4+ LE edema. D/w nursing staff, CVP this am was 10-11. CXR today shows right pleural effusion which is unchanged compared to CXR on 11/30, no PTx.  Objective   Vitals:   08/25/24 2320 08/26/24 0321 08/26/24 0759 08/26/24 0801  BP: (!) 114/43   (!) 132/47  Pulse: 75   76  Resp: 15   15  Temp: 97.6 F (36.4 C) 97.6 F (36.4 C) (!) 97.4 F (36.3 C) (!) 97.4 F (36.3 C)  TempSrc: Oral Oral  Oral  SpO2: 98% 96%  93%  Weight:      Height:        Intake/Output Summary (Last 24 hours) at 08/26/2024 9180 Last data filed at 08/25/2024 0900 Gross per 24 hour  Intake --  Output 250 ml  Net -250 ml   Filed Weights   08/22/24 0259 08/25/24 0500  Weight: 83.9 kg 91.9 kg    Physical Exam   General appearance: alert and no distress Neck: JVD - 5 cm above sternal notch, no carotid bruit, and thyroid not enlarged, symmetric, no tenderness/mass/nodules Lungs: diminished breath sounds RLL Heart: regular rate and rhythm Abdomen: soft, non-tender; bowel sounds normal; no masses,  no organomegaly and in a back brace Extremities: edema 3-4+ bilateral pitting edema Neurologic: Grossly normal  Inpatient Medications    Scheduled Meds:  acetaminophen   650 mg Oral Q6H   amiodarone   200 mg Oral Daily    atorvastatin   40 mg Oral Daily   Chlorhexidine  Gluconate Cloth  6 each Topical Daily   enoxaparin  (LOVENOX ) injection  30 mg Subcutaneous Q24H   furosemide   80 mg Intravenous BID   pneumococcal 20-valent conjugate vaccine  0.5 mL Intramuscular Tomorrow-1000   polyethylene glycol  17 g Oral Daily   sodium chloride  flush  10-40 mL Intracatheter Q12H    Continuous Infusions:  milrinone  0.25 mcg/kg/min (08/26/24 0807)    PRN Meds: HYDROmorphone  (DILAUDID ) injection, methocarbamol , oxyCODONE , sodium chloride  flush   Labs   Results for orders placed or performed during the hospital encounter of 08/22/24 (from the past 48 hours)  CBC     Status: Abnormal   Collection Time: 08/25/24  9:26 AM  Result Value Ref Range   WBC 6.7 4.0 - 10.5 K/uL   RBC 3.03 (L) 4.22 - 5.81 MIL/uL   Hemoglobin 8.8 (L) 13.0 - 17.0 g/dL   HCT 71.5 (L) 60.9 - 47.9 %   MCV 93.7 80.0 - 100.0 fL   MCH 29.0 26.0 - 34.0 pg   MCHC 31.0 30.0 - 36.0 g/dL   RDW 85.2 88.4 - 84.4 %   Platelets 190 150 - 400 K/uL   nRBC 0.0 0.0 - 0.2 %    Comment: Performed at [facility], [address],  [place], [place] [address]  Basic metabolic panel     Status: Abnormal   Collection Time: 08/25/24  9:26 AM  Result Value Ref Range   Sodium 133 (L) 135 - 145 mmol/L   Potassium 5.2 (H) 3.5 - 5.1 mmol/L   Chloride 101 98 - 111 mmol/L   CO2 24 22 - 32 mmol/L   Glucose, Bld 85 70 - 99 mg/dL    Comment: Glucose reference range applies only to samples taken after fasting for at least 8 hours.   BUN 70 (H) 8 - 23 mg/dL   Creatinine, Ser 6.21 (H) 0.61 - 1.24 mg/dL   Calcium  9.6 8.9 - 10.3 mg/dL   GFR, Estimated 16 (L) >60 mL/min    Comment: (NOTE) Calculated using the CKD-EPI Creatinine Equation (2021)    Anion gap 8 5 - 15    Comment: Performed at [facility], [address] [place], [place] [address]  Lactic acid, plasma     Status: None   Collection Time: 08/25/24  1:51 PM  Result Value Ref Range   Lactic Acid,  Venous 1.8 0.5 - 1.9 mmol/L    Comment: Performed at [facility], [address] [place], [place] [address]  Hepatic function panel     Status: Abnormal   Collection Time: 08/25/24  1:51 PM  Result Value Ref Range   Total Protein 6.7 6.5 - 8.1 g/dL   Albumin  3.1 (L) 3.5 - 5.0 g/dL   AST 21 15 - 41 U/L   ALT 15 0 - 44 U/L   Alkaline Phosphatase 71 38 - 126 U/L   Total Bilirubin 1.4 (H) 0.0 - 1.2 mg/dL   Bilirubin, Direct 0.6 (H) 0.0 - 0.2 mg/dL   Indirect Bilirubin 0.8 0.3 - 0.9 mg/dL    Comment: Performed at [facility], [address], [place], [place] [address]  Lactic acid, plasma     Status: None   Collection Time: 08/25/24  4:07 PM  Result Value Ref Range   Lactic Acid, Venous 1.5 0.5 - 1.9 mmol/L    Comment: Performed at [facility], [address], [place], [place] [address]  Cooxemetry Panel (carboxy, met, total hgb, O2 sat)     Status: Abnormal   Collection Time: 08/25/24  7:50 PM  Result Value Ref Range   Total hemoglobin 8.8 (L) 12.0 - 16.0 g/dL   O2 Saturation 40.7 %   Carboxyhemoglobin 2.3 (H) 0.5 - 1.5 %   Methemoglobin <0.7 0.0 - 1.5 %    Comment: Performed at [facility], [address] [place], [place] [address]  CBC     Status: Abnormal   Collection Time: 08/26/24  3:45 AM  Result Value Ref Range   WBC 7.7 4.0 - 10.5 K/uL   RBC 2.85 (L) 4.22 - 5.81 MIL/uL   Hemoglobin 8.4 (L) 13.0 - 17.0 g/dL   HCT 73.7 (L) 60.9 - 47.9 %   MCV 91.9 80.0 - 100.0 fL   MCH 29.5 26.0 - 34.0 pg   MCHC 32.1 30.0 - 36.0 g/dL   RDW 85.3 88.4 - 84.4 %   Platelets 200 150 - 400 K/uL   nRBC 0.0 0.0 - 0.2 %    Comment: Performed at [facility], [address] [place], [place] [address]  Basic metabolic panel     Status: Abnormal   Collection Time: 08/26/24  3:45 AM  Result Value Ref Range   Sodium 131 (  L) 135 - 145 mmol/L   Potassium 5.1 3.5 - 5.1 mmol/L   Chloride 101 98 - 111 mmol/L   CO2 20 (L) 22 - 32 mmol/L   Glucose, Bld 113 (H) 70 - 99  mg/dL    Comment: Glucose reference range applies only to samples taken after fasting for at least 8 hours.   BUN 81 (H) 8 - 23 mg/dL   Creatinine, Ser 5.67 (H) 0.61 - 1.24 mg/dL   Calcium  9.6 8.9 - 10.3 mg/dL   GFR, Estimated 14 (L) >60 mL/min    Comment: (NOTE) Calculated using the CKD-EPI Creatinine Equation (2021)    Anion gap 10 5 - 15    Comment: Performed at [facility], [address] [place], [place] [address]  Magnesium      Status: None   Collection Time: 08/26/24  3:45 AM  Result Value Ref Range   Magnesium  1.8 1.7 - 2.4 mg/dL    Comment: Performed at [facility], [address], [place], [place] [address]  Cooxemetry Panel (carboxy, met, total hgb, O2 sat)     Status: Abnormal   Collection Time: 08/26/24  3:45 AM  Result Value Ref Range   Total hemoglobin 8.9 (L) 12.0 - 16.0 g/dL   O2 Saturation 37.4 %   Carboxyhemoglobin 2.2 (H) 0.5 - 1.5 %   Methemoglobin 0.8 0.0 - 1.5 %    Comment: Performed at [facility], [address] [place], [place] [address]    ECG   Paced rhythm - Personally Reviewed  Telemetry   Paced rhythm - Personally Reviewed  Radiology    DG CHEST PORT 1 VIEW Result Date: 08/26/2024 EXAM: 1 VIEW(S) XRAY OF THE CHEST 08/26/2024 05:41:54 AM COMPARISON: 08/24/2024 CLINICAL HISTORY: Hemothorax FINDINGS: LINES, TUBES AND DEVICES: Right PICC line terminates at superior cavoatrial junction. Stable left single lead pacemaker in place. LUNGS AND PLEURA: Similar right pleural effusion and right basilar opacity. No pneumothorax. HEART AND MEDIASTINUM: Stable cardiomegaly. Aortic atherosclerosis. BONES AND SOFT TISSUES: No acute osseous abnormality. IMPRESSION: 1. Similar right pleural effusion and right basilar opacity, unchanged from prior study. Electronically signed by: Waddell Calk MD 08/26/2024 05:49 AM EST RP Workstation: [id number]   US  EKG SITE RITE Result Date: 08/25/2024 If Site Rite image not attached, placement could not be  confirmed due to current cardiac rhythm.   Cardiac Studies   N/A  Assessment   Principal Problem:   Multiple traumatic injuries Active Problems:   Cardiorenal syndrome with renal failure Low output biventricular heart failure  Plan   Dean [name] has significant LE edema/volume overload, small persistent right pleural effusion - able to walk without dyspnea, but 3-4+ LE edema. Initial co-ox was 59%, improved to 62% on milrinone  overnight- will resume lasix  80 mg IV BID today- repeat creatinine this afternoon. Monitor urine output. CVP 10-11 this am. Continue milrinone  0.25 mcg/kg/min. Wil d/w advanced HF service for additional recommendations. May need nephrology input - d/w Dr. Vann.  CRITICAL CARE TIME: I have spent a total of 35 minutes with patient reviewing hospital notes, telemetry, EKGs, labs and examining the patient as well as establishing an assessment and plan that was discussed with the patient.  > 50% of time was spent in direct patient care. The patient is critically ill with multi-organ system failure and requires high complexity decision making for assessment and support, frequent evaluation and titration of therapies, application of advanced monitoring technologies and extensive interpretation of multiple databases.  Length of Stay:  LOS: 4 days   Vinie KYM Maxcy, MD, [facility], FNLA, FACP  [place]  [facility]  Medical Director of the Advanced Lipid Disorders &  Cardiovascular Risk Reduction Clinic Diplomate of the American Board of Clinical Lipidology Attending Cardiologist  Direct Dial: [phone]  Fax: [phone]  Website:  [email or web address] Vinie BROCKS Favian Kittleson 08/26/2024, 8:19 AM

## 2024-08-26 NOTE — Progress Notes (Cosign Needed Addendum)
 Subjective: CC: No current complaints. Denies sob. Reports he walked yesterday without sob. On RA. Hgb 8.4 from 8.8 after starting lovenox  yesterday.   Objective: Vital signs in last 24 hours: Temp:  [97.4 F (36.3 C)-97.7 F (36.5 C)] 97.4 F (36.3 C) (12/02 0801) Pulse Rate:  [69-80] 80 (12/02 0847) Resp:  [14-17] 16 (12/02 0847) BP: (105-132)/(42-61) 114/61 (12/02 0847) SpO2:  [93 %-100 %] 93 % (12/02 0801) Last BM Date : 08/25/24  Intake/Output from previous day: 12/01 0701 - 12/02 0700 In: -  Out: 250 [Urine:250] Intake/Output this shift: No intake/output data recorded.  PE: Gen:  Alert, NAD, pleasant, sitting up in chair w/ TLSO brace on  Card:  Reg rate Pulm:  Lung sounds in all fiends b/l. Rate and effort normal on RA.  Ext:  Noted bilateral lower extremity edema  Lab Results:  Recent Labs    08/25/24 0926 08/26/24 0345  WBC 6.7 7.7  HGB 8.8* 8.4*  HCT 28.4* 26.2*  PLT 190 200   BMET Recent Labs    08/25/24 0926 08/26/24 0345  NA 133* 131*  K 5.2* 5.1  CL 101 101  CO2 24 20*  GLUCOSE 85 113*  BUN 70* 81*  CREATININE 3.78* 4.32*  CALCIUM  9.6 9.6   PT/INR No results for input(s): LABPROT, INR in the last 72 hours. CMP     Component Value Date/Time   NA 131 (L) 08/26/2024 0345   NA 140 06/04/2024 1445   K 5.1 08/26/2024 0345   CL 101 08/26/2024 0345   CO2 20 (L) 08/26/2024 0345   GLUCOSE 113 (H) 08/26/2024 0345   BUN 81 (H) 08/26/2024 0345   BUN 23 06/04/2024 1445   CREATININE 4.32 (H) 08/26/2024 0345   CALCIUM  9.6 08/26/2024 0345   PROT 6.7 08/25/2024 1351   PROT 6.6 05/16/2024 1308   ALBUMIN  3.1 (L) 08/25/2024 1351   ALBUMIN  3.5 (L) 05/16/2024 1308   AST 21 08/25/2024 1351   ALT 15 08/25/2024 1351   ALKPHOS 71 08/25/2024 1351   BILITOT 1.4 (H) 08/25/2024 1351   BILITOT 2.0 (H) 05/16/2024 1308   GFRNONAA 14 (L) 08/26/2024 0345   GFRAA 59 (L) 11/02/2020 1457   Lipase     Component Value Date/Time   LIPASE 28  04/16/2024 2252    Studies/Results: DG CHEST PORT 1 VIEW Result Date: 08/26/2024 EXAM: 1 VIEW(S) XRAY OF THE CHEST 08/26/2024 05:41:54 AM COMPARISON: 08/24/2024 CLINICAL HISTORY: Hemothorax FINDINGS: LINES, TUBES AND DEVICES: Right PICC line terminates at superior cavoatrial junction. Stable left single lead pacemaker in place. LUNGS AND PLEURA: Similar right pleural effusion and right basilar opacity. No pneumothorax. HEART AND MEDIASTINUM: Stable cardiomegaly. Aortic atherosclerosis. BONES AND SOFT TISSUES: No acute osseous abnormality. IMPRESSION: 1. Similar right pleural effusion and right basilar opacity, unchanged from prior study. Electronically signed by: Waddell Calk MD 08/26/2024 05:49 AM EST RP Workstation: [id number]   US  EKG SITE RITE Result Date: 08/25/2024 If Site Rite image not attached, placement could not be confirmed due to current cardiac rhythm.   Anti-infectives: Anti-infectives (From admission, onward)    None        Assessment/Plan Fall from wheelchair T6 vb fx - Per NSGY, TLSO brace, outpatient follow up in 2 weeks. Moderate right pleural effusion - - Did have small bilateral pleural effusions on CT in mid October.  - S/p thoracentesis 11/28 with bloody output. Cx NGTD - HF team following and diuresing with lasix . Defer diureses to  their team w/ AKI - CXR today stable w/ R pleural effusion and right basilar opacity - Patient denies sob and is on RA. I think we can hold off on further thora or chest tube placement at this time.  - Cont aggressive pulmonary toilet, mobilize as able   FEN: 2g sodium diet  VTE: SCD's, Lovenox , hold DOAC for now, if our team signs off will send primary team message on when can resume Foley: none ID: none Dispo: Progressive care   - Per primary -  Chronic heart failure (ischemic cardiomyopathy) - HF following. On Milrinone  gtt and Lasix  Hx VT/VF arrest and AICD Hx hypertension Hx OSA on CPAP Hx A-fib on Eliquis  Hx  type 2 diabetes  AKI -  Multilevel foraminal/spinal stenoses/ severe degenerative disease  dense liver suggesting metallic or amiodarone  toxicity Caridomegaly and aortic/acoronary atherosclerosis Sigmoid diverticulosis  I reviewed nursing notes, Consultant (HF) notes, hospitalist notes, last 24 h vitals and pain scores, last 48 h intake and output, last 24 h labs and trends, and last 24 h imaging results.    LOS: 4 days    Dean [name] Shaper, [facility] 08/26/2024, 9:42 AM Please see Amion for pager number during day hours 7:00am-4:30pm

## 2024-08-26 NOTE — Plan of Care (Signed)
   Problem: Activity: Goal: Risk for activity intolerance will decrease Outcome: Progressing

## 2024-08-26 NOTE — Progress Notes (Signed)
 Occupational Therapy Treatment Patient Details Name: Dean Lombardo Sr. MRN: [id number] DOB: [birth year] Today's Date: 08/26/2024   History of present illness 73 yo male s/p fall down 8 stairs in wheelchair. CT (+) T6 vertebral body fx, neurosurgery states no plan for surgery but needs TLSO brace. S/p R thoracentesis 11/28 yielding 500 mL bloody fluid. PMH AICD, arthritis, HLD, HFrEF, ICD, PAF on Eliquis , HTN, CAD, HLD, OSA on cpap, T2DM, CKD, HTN, laminectomy, cardiac arrest (2017).   OT comments  Pt is making steady progress towards their acute OT goals. He remains limited by back pain, activity tolerance, SOB with ambulation, BLE swelling and soft DBP. Pt declined ADLs but was agreeable to work towards activity tolerance. He transferred and ambulated with up to CGA and RW and needed 3x standing rest breaks. Pt SpO2 dropped to 92% and recovered to 100% on 2L (pt request). OT to continue to follow acutely to facilitate progress towards established goals. Pt will continue to benefit from [facility].       If plan is discharge home, recommend the following:  A little help with walking and/or transfers;A lot of help with bathing/dressing/bathroom;Assistance with cooking/housework;Direct supervision/assist for medications management;Assist for transportation;Help with stairs or ramp for entrance   Equipment Recommendations  Other (comment)       Precautions / Restrictions Precautions Precautions: Fall;Back Recall of Precautions/Restrictions: Intact Required Braces or Orthoses: Spinal Brace Spinal Brace: Thoracolumbosacral orthotic Restrictions Weight Bearing Restrictions Per Provider Order: No       Mobility Bed Mobility               General bed mobility comments: OOB on arrival    Transfers Overall transfer level: Needs assistance Equipment used: Rolling walker (2 wheels) Transfers: Sit to/from Stand Sit to Stand: Contact guard assist                 Balance Overall  balance assessment: Needs assistance Sitting-balance support: No upper extremity supported, Feet supported Sitting balance-Leahy Scale: Fair     Standing balance support: Bilateral upper extremity supported Standing balance-Leahy Scale: Fair                             ADL either performed or assessed with clinical judgement   ADL Overall ADL's : Needs assistance/impaired                         Toilet Transfer: Contact guard assist;Ambulation;Rolling walker (2 wheels)           Functional mobility during ADLs: Contact guard assist;Rolling walker (2 wheels) General ADL Comments: pt declined ADLs, agreeable to functional mobility and working towards activity tolerance    Extremity/Trunk Assessment Upper Extremity Assessment Upper Extremity Assessment: Defer to OT evaluation   Lower Extremity Assessment Lower Extremity Assessment: Defer to PT evaluation        Vision   Vision Assessment?: No apparent visual deficits   Perception Perception Perception: Not tested   Praxis Praxis Praxis: Not tested   Communication Communication Communication: No apparent difficulties   Cognition Arousal: Alert Behavior During Therapy: WFL for tasks assessed/performed Cognition: No apparent impairments             OT - Cognition Comments: voiced frustration of being told different information by doctors                 Following commands: Intact        Cueing  Cueing Techniques: Verbal cues        General Comments HR to 108, SpO2 down to 92    Pertinent Vitals/ Pain       Pain Assessment Pain Assessment: Faces Faces Pain Scale: Hurts a little bit Pain Location: R side back Pain Descriptors / Indicators: Guarding, Discomfort Pain Intervention(s): Monitored during session, Limited activity within patient's tolerance   Frequency  Min 2X/week        Progress Toward Goals  OT Goals(current goals can now be found in the care plan  section)  Progress towards OT goals: Progressing toward goals  Acute Rehab OT Goals Patient Stated Goal: home OT Goal Formulation: With patient Time For Goal Achievement: 09/05/24 Potential to Achieve Goals: Good ADL Goals Pt Will Perform Lower Body Dressing: with modified independence;sit to/from stand Pt Will Transfer to Toilet: with modified independence;ambulating Additional ADL Goal #1: Pt will indep follow spinal precautions during ADLs  Plan         AM-PAC OT 6 Clicks Daily Activity     Outcome Measure   Help from another person eating meals?: None Help from another person taking care of personal grooming?: A Little Help from another person toileting, which includes using toliet, bedpan, or urinal?: A Little Help from another person bathing (including washing, rinsing, drying)?: A Lot Help from another person to put on and taking off regular upper body clothing?: A Little Help from another person to put on and taking off regular lower body clothing?: A Lot 6 Click Score: 17    End of Session Equipment Utilized During Treatment: Back brace;Rolling walker (2 wheels)  OT Visit Diagnosis: Unsteadiness on feet (R26.81);Other abnormalities of gait and mobility (R26.89);Muscle weakness (generalized) (M62.81);History of falling (Z91.81);Pain   Activity Tolerance Patient tolerated treatment well   Patient Left in chair;with call bell/phone within reach;with chair alarm set;with family/visitor present   Nurse Communication Mobility status        Time: 8465-8448 OT Time Calculation (min): 17 min  Charges: OT General Charges $OT Visit: 1 Visit OT Treatments $Therapeutic Activity: 8-22 mins  [name], OTR/L Acute Rehabilitation Services Office [phone] Secure Chat Communication Preferred   [name] 08/26/2024, 4:10 PM

## 2024-08-26 NOTE — Progress Notes (Signed)
 RN and NT attempted to obtain daily weights and educated patient on the importance of daily weights and CHF, patient would like nursing team to attempt later, after breakfast, patient does not want to go back to bed for daily weights. Cardiology at bedside, informed MD.

## 2024-08-26 NOTE — Progress Notes (Signed)
 PROGRESS NOTE    Dean Croft Sr.  FMW:[id number] DOB: [birth year] DOA: 08/22/2024 PCP: [name], MD    Brief Narrative:  65M h/o CAD, PAF, ICM, cardiac arrest s/p AICD, HFrEF (EF 35-40% in 04/2024), HTN, HLD, DM2, OSA, and last admission in 06/2204 for HFrEF exacerbation who p/w back pain s/p GLF from wheelchair and found to have AKI and acute T6 fracture. Recent OP Cards tele appointment note on 11/20 suggested multiple missed appointments and recent increase in edema and uptitration of torsemide  to 60mg  daily.  Hospital stay complicated with HF With worsening renal function.  Cardiology as well as renal now following    Assessment and Plan: Acute T6 fracture -NSGY consulted; apprec eval/recs (no surgery indicated; TLSO and OP f/u w/ Dr. [name]) -TLSO brace for now -moving well with PT-- home health   GLF Physical deconditioning -PT/OT consulted- home health   R pleural effusion RML/RLL consolidation  Cardiorenal syndrome with renal failure/Low output biventricular heart failure -IR consulted for thoracentesis per surgery-- bloody -- will hold eliquis  for now but start lovenox  -repeat chest x ray  similar--  breathing well, not on O2- hold on any further procedures -cardiology consulted- milrinone  -renal consult (Dr. Jerrye) -trial of diuresis with close monitoring of CR with PM labs   Liver enhancement on imaging -OP f/u with Cards/GI to discuss amiodarone  toxicity   PAF -PTA amiodarone  200mg  daily and Eliquis  5mg  BID (hold)    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 -baseline 2.5ish-- up to 3.78 -Bladder scan - Hold torsemide    DVT prophylaxis: enoxaparin  (LOVENOX ) injection 30 mg Start: 08/25/24 1000    Code Status: Limited: Do not attempt resuscitation (DNR) -DNR-LIMITED -Do Not Intubate/DNI    Disposition Plan:  Level of care: Progressive Status is: Inpatient   Consultants:  Trauma NS Cards renal   Subjective: Still with LE edema  Objective: Vitals:   08/26/24  0321 08/26/24 0759 08/26/24 0801 08/26/24 0847  BP:   (!) 132/47 114/61  Pulse:   76 80  Resp:   15 16  Temp: 97.6 F (36.4 C) (!) 97.4 F (36.3 C) (!) 97.4 F (36.3 C)   TempSrc: Oral  Oral   SpO2: 96%  93%   Weight:      Height:       No intake or output data in the 24 hours ending 08/26/24 1008  Filed Weights   08/22/24 0259 08/25/24 0500  Weight: 83.9 kg 91.9 kg    Examination:   General: Appearance:     Overweight male in no acute distress     Lungs:     diminished  Heart:    Normal heart rate. + LE Edema   MS:   All extremities are intact.    Neurologic:   Awake, alert       Data Reviewed: I have personally reviewed following labs and imaging studies  CBC: Recent Labs  Lab 08/22/24 0418 08/22/24 0903 08/24/24 0318 08/25/24 0926 08/26/24 0345  WBC 6.8 8.4 6.2 6.7 7.7  NEUTROABS 5.0  --   --   --   --   HGB 9.0* 9.0* 8.2* 8.8* 8.4*  HCT 28.1* 28.3* 25.5* 28.4* 26.2*  MCV 92.1 92.8 92.1 93.7 91.9  PLT 142* 156 134* 190 200   Basic Metabolic Panel: Recent Labs  Lab 08/22/24 0418 08/22/24 0903 08/24/24 0318 08/25/24 0926 08/26/24 0345  NA 135  --  133* 133* 131*  K 4.1  --  4.6 5.2* 5.1  CL 103  --  103 101 101  CO2 19*  --  19* 24 20*  GLUCOSE 105*  --  104* 85 113*  BUN 44*  --  57* 70* 81*  CREATININE 2.83* 2.89* 3.77* 3.78* 4.32*  CALCIUM  9.5  --  9.2 9.6 9.6  MG  --   --   --   --  1.8   GFR: Estimated Creatinine Clearance: 15.9 mL/min (A) (by C-G formula based on SCr of 4.32 mg/dL (H)). Liver Function Tests: Recent Labs  Lab 08/22/24 0418 08/25/24 1351  AST 22 21  ALT 15 15  ALKPHOS 53 71  BILITOT 1.5* 1.4*  PROT 6.3* 6.7  ALBUMIN  3.0* 3.1*   No results for input(s): LIPASE, AMYLASE in the last 168 hours. No results for input(s): AMMONIA in the last 168 hours. Coagulation Profile: Recent Labs  Lab 08/22/24 0418  INR 1.6*   Cardiac Enzymes: No results for input(s): CKTOTAL, CKMB, CKMBINDEX, TROPONINI in  the last 168 hours. BNP (last 3 results) No results for input(s): PROBNP in the last 8760 hours. HbA1C: No results for input(s): HGBA1C in the last 72 hours. CBG: No results for input(s): GLUCAP in the last 168 hours. Lipid Profile: No results for input(s): CHOL, HDL, LDLCALC, TRIG, CHOLHDL, LDLDIRECT in the last 72 hours. Thyroid Function Tests: No results for input(s): TSH, T4TOTAL, FREET4, T3FREE, THYROIDAB in the last 72 hours. Anemia Panel: No results for input(s): VITAMINB12, FOLATE, FERRITIN, TIBC, IRON, RETICCTPCT in the last 72 hours. Sepsis Labs: Recent Labs  Lab 08/22/24 0903 08/25/24 1351 08/25/24 1607  PROCALCITON 0.44  --   --   LATICACIDVEN  --  1.8 1.5    Recent Results (from the past 240 hours)  Body fluid culture w Gram Stain     Status: None   Collection Time: 08/22/24 12:22 PM   Specimen: Lung, Right; Pleural Fluid  Result Value Ref Range Status   Specimen Description PLEURAL  Final   Special Requests RIGHT LUNG  Final   Gram Stain   Final    RARE WBC PRESENT, PREDOMINANTLY PMN NO ORGANISMS SEEN    Culture   Final    NO GROWTH 3 DAYS Performed at [facility], [address], [place], [place] [address]    Report Status 08/25/2024 FINAL  Final         Radiology Studies: DG CHEST PORT 1 VIEW Result Date: 08/26/2024 EXAM: 1 VIEW(S) XRAY OF THE CHEST 08/26/2024 05:41:54 AM COMPARISON: 08/24/2024 CLINICAL HISTORY: Hemothorax FINDINGS: LINES, TUBES AND DEVICES: Right PICC line terminates at superior cavoatrial junction. Stable left single lead pacemaker in place. LUNGS AND PLEURA: Similar right pleural effusion and right basilar opacity. No pneumothorax. HEART AND MEDIASTINUM: Stable cardiomegaly. Aortic atherosclerosis. BONES AND SOFT TISSUES: No acute osseous abnormality. IMPRESSION: 1. Similar right pleural effusion and right basilar opacity, unchanged from prior study. Electronically signed by: Waddell Calk MD 08/26/2024 05:49 AM EST RP Workstation: [id number]   US  EKG SITE RITE Result Date: 08/25/2024 If Site Rite image not attached, placement could not be confirmed due to current cardiac rhythm.       Scheduled Meds:  acetaminophen   650 mg Oral Q6H   amiodarone   200 mg Oral Daily   atorvastatin   40 mg Oral Daily   Chlorhexidine  Gluconate Cloth  6 each Topical Daily   enoxaparin  (LOVENOX ) injection  30 mg Subcutaneous Q24H   furosemide   80 mg Intravenous BID   polyethylene glycol  17 g Oral Daily   sodium chloride   flush  10-40 mL Intracatheter Q12H   Continuous Infusions:  milrinone  0.25 mcg/kg/min (08/26/24 0807)     LOS: 4 days    Time spent: 45 minutes spent on chart review, discussion with nursing staff, consultants, updating family and interview/physical exam; more than 50% of that time was spent in counseling and/or coordination of care.    Harlene RAYMOND Bowl, DO [facility] Available via Epic secure chat 7am-7pm After these hours, please refer to coverage provider listed on [email or web address] 08/26/2024, 10:08 AM

## 2024-08-26 NOTE — Consult Note (Signed)
 [facility] Renal Consultation Note  Requesting MD: Harlene Bowl, DO Indication for Consultation:  AKI  Chief complaint: fall   HPI:  Dean Spraggins Sr. is a 73 y.o. male with a history including CAD, CKD stage IV, paroxysmal afib, hx cardiac arrest s/p AICD, heart failure with reduced EF, and HTN who presented to the hospital after a fall down the stairs.  He was found to have an acute T6 fracture - neurosurgery was consulted and no surgery is indicated at this time.  He was also noted on admission to have a moderate sized right pleural effusion, suggestive of hemorrhagic or proteinaceous content.  Also with incidental finding of enlarged prostate and body wall edema.  He didn't have any contrasted studies noted.  Cardiology had seen the patient and initiated diuretics and milrinone  was added today.  Strict ins/outs are not available.  He had 250 mL UOP over 12/1 as well as three unmeasured urine voids.  He has had one unmeasured urine void today.  He has had some mild hypotension today.  CVP is low or negative per nursing report.  The milrinone  is now off.  Nephrology is consulted for assistance with management of AKI.  He was bladder scanned and had 0 ml.  He had a thoracentesis this admit with 500 mL bloody fluid.  He follows with Dr. [name] and baseline Cr appears to be 2-3.  He has previously seen Dr. [name] in heart failure (most recently seen inpt by Dr. Rolan during a 06/2024 admission).  Per cardiology report, heart failure team is concerned that he may not tolerate dialysis.  He previously was on irbesartan  and torsemide  and has gotten intermittent IV lasix .     He states that when he last spoke with renal after his last admission that his creatinine was at baseline and he was recommended to follow-up as scheduled.  He states urine output has gone down ever since he started hydralazine -isosorbide  combination pill.  He would hope to avoid dialysis if possible.  If he needed  it, he thinks he would want to dry but really hopes it doesn't come to that.    Creatinine, Ser  Date/Time Value Ref Range Status  08/26/2024 01:32 PM 5.01 (H) 0.61 - 1.24 mg/dL Final  87/97/7974 96:54 AM 4.32 (H) 0.61 - 1.24 mg/dL Final  87/98/7974 90:73 AM 3.78 (H) 0.61 - 1.24 mg/dL Final  88/69/7974 96:81 AM 3.77 (H) 0.61 - 1.24 mg/dL Final  88/71/7974 90:96 AM 2.89 (H) 0.61 - 1.24 mg/dL Final  88/71/7974 95:81 AM 2.83 (H) 0.61 - 1.24 mg/dL Final  89/74/7974 93:99 AM 2.48 (H) 0.61 - 1.24 mg/dL Final  89/75/7974 94:99 AM 2.41 (H) 0.61 - 1.24 mg/dL Final  89/76/7974 95:44 AM 2.29 (H) 0.61 - 1.24 mg/dL Final  89/77/7974 95:60 AM 2.32 (H) 0.61 - 1.24 mg/dL Final  89/78/7974 93:78 AM 2.50 (H) 0.61 - 1.24 mg/dL Final  89/79/7974 94:56 AM 2.03 (H) 0.61 - 1.24 mg/dL Final  89/80/7974 96:99 PM 2.62 (H) 0.61 - 1.24 mg/dL Final  89/80/7974 93:97 AM 2.70 (H) 0.61 - 1.24 mg/dL Final  89/81/7974 94:99 AM 2.48 (H) 0.61 - 1.24 mg/dL Final  89/82/7974 96:69 PM 2.66 (H) 0.61 - 1.24 mg/dL Final  89/82/7974 97:50 AM 2.41 (H) 0.61 - 1.24 mg/dL Final  89/83/7974 97:63 AM 2.50 (H) 0.61 - 1.24 mg/dL Final  89/83/7974 97:72 AM 2.49 (H) 0.61 - 1.24 mg/dL Final  89/98/7974 95:95 PM 2.16 (H) 0.61 - 1.24 mg/dL Final  90/89/7974 97:54  PM 1.83 (H) 0.76 - 1.27 mg/dL Final  91/77/7974 98:91 PM 2.65 (H) 0.76 - 1.27 mg/dL Final  91/83/7974 96:54 AM 1.78 (H) 0.61 - 1.24 mg/dL Final  91/84/7974 94:99 AM 2.48 (H) 0.61 - 1.24 mg/dL Final  91/85/7974 95:69 AM 3.09 (H) 0.61 - 1.24 mg/dL Final  91/86/7974 94:45 AM 3.10 (H) 0.61 - 1.24 mg/dL Final  91/87/7974 94:51 AM 2.83 (H) 0.61 - 1.24 mg/dL Final  91/88/7974 95:52 AM 2.77 (H) 0.61 - 1.24 mg/dL Final  91/89/7974 94:91 AM 2.80 (H) 0.61 - 1.24 mg/dL Final  91/89/7974 96:66 AM 2.87 (H) 0.61 - 1.24 mg/dL Final  91/90/7974 90:53 PM 2.72 (H) 0.61 - 1.24 mg/dL Final    Comment:    DELTA CHECK NOTED  05/02/2024 05:45 AM 1.72 (H) 0.61 - 1.24 mg/dL Final  92/70/7974 97:73  AM 2.17 (H) 0.61 - 1.24 mg/dL Final  92/71/7974 97:43 AM 1.93 (H) 0.61 - 1.24 mg/dL Final  92/72/7974 89:47 AM 1.86 (H) 0.61 - 1.24 mg/dL Final  92/76/7974 89:47 PM 1.59 (H) 0.61 - 1.24 mg/dL Final  92/92/7974 95:81 PM 1.77 (H) 0.61 - 1.24 mg/dL Final  94/72/7974 89:76 AM 1.97 (H) 0.76 - 1.27 mg/dL Final  94/97/7974 88:88 AM 2.19 (H) 0.76 - 1.27 mg/dL Final  95/71/7974 96:93 PM 2.49 (H) 0.76 - 1.27 mg/dL Final  95/75/7974 88:61 AM 2.14 (H) 0.76 - 1.27 mg/dL Final  95/89/7974 95:59 PM 1.71 (H) 0.76 - 1.27 mg/dL Final  96/82/7974 98:72 PM 2.18 (H) 0.76 - 1.27 mg/dL Final  97/75/7974 94:96 AM 1.64 (H) 0.61 - 1.24 mg/dL Final  97/76/7974 97:60 AM 1.81 (H) 0.61 - 1.24 mg/dL Final  97/77/7974 98:56 PM 1.70 (H) 0.61 - 1.24 mg/dL Final  91/87/7975 89:63 AM 2.29 (H) 0.76 - 1.27 mg/dL Final  97/86/7975 97:65 PM 1.78 (H) 0.76 - 1.27 mg/dL Final  87/77/7976 88:52 AM 1.97 (H) 0.76 - 1.27 mg/dL Final  87/86/7976 90:99 AM 2.90 (H) 0.76 - 1.27 mg/dL Final  87/97/7976 98:67 AM 2.23 (H) 0.61 - 1.24 mg/dL Final  87/98/7976 95:99 AM 2.10 (H) 0.61 - 1.24 mg/dL Final     PMHx:   Past Medical History:  Diagnosis Date   AICD (automatic cardioverter/defibrillator) present 10/10/2016   a. 09/2016 s/p MDT Visia AF MRI single lead ICD (ser # [id number]).   Arthritis    maybe in his spine (09/05/2016)   Asthma    pt. denies   Cardiac arrest (HCC)    a. 08/2016 VT/VF Arrest-->09/2016 s/p MDT Visia AF MRI single lead ICD (ser # [id number]).   Chronic HFrEF (heart failure with reduced ejection fraction) (HCC)    a. 08/2016 Echo: EF 35-40%; b. 04/2018 Echo: EF 30-35%; c. 04/2020 Echo: EF 30-35%; d. 11/2021 Echo: EF 40-45%, GrI DD, nl RV fxn, sev dil LA, mild-mod MR, mild-mod AI, Ao root 38mm.   Coronary artery disease    a. 08/2016 VT Arrest/Cath: LM nl, LAD nl, LCX nl, RGCA 90p, 100d w/ L->R collats, EF 35-45%.   Gout    Hyperlipidemia LDL goal <70    Hypertension    Ischemic cardiomyopathy    a. 08/2016  Echo: EF 35-40%; b. 04/2018 Echo: EF 30-35%; c. 04/2020 Echo: EF 30-35%; d. 11/2021 Echo: EF 40-45%.   LBBB (left bundle branch block)    OSA on CPAP    does not wear CPAP   PAF (paroxysmal atrial fibrillation) (HCC)    a. CHA2DS2VASc = 5-->Eliquis .   Type II diabetes mellitus (HCC)  Past Surgical History:  Procedure Laterality Date   CARDIAC CATHETERIZATION N/A 09/07/2016   Procedure: Left Heart Cath and Coronary Angiography;  Surgeon: Peter M Jordan, MD;  Location: [facility] INVASIVE CV LAB;  Service: Cardiovascular;  Laterality: N/A;   CENTRAL LINE INSERTION  05/06/2024   Procedure: CENTRAL LINE INSERTION;  Surgeon: Cherrie Toribio SAUNDERS, MD;  Location: [facility] INVASIVE CV LAB;  Service: Cardiovascular;;   COLONOSCOPY WITH PROPOFOL  N/A 08/30/2018   Procedure: COLONOSCOPY WITH PROPOFOL ;  Surgeon: Rollin Dover, MD;  Location: [facility];  Service: Endoscopy;  Laterality: N/A;   COLONOSCOPY WITH PROPOFOL  N/A 11/16/2023   Procedure: COLONOSCOPY WITH PROPOFOL ;  Surgeon: Rollin Dover, MD;  Location: [facility];  Service: Gastroenterology;  Laterality: N/A;   COLONOSCOPY WITH PROPOFOL  N/A 11/18/2023   Procedure: COLONOSCOPY WITH PROPOFOL ;  Surgeon: Charlanne Groom, MD;  Location: [facility];  Service: Gastroenterology;  Laterality: N/A;   EP IMPLANTABLE DEVICE N/A 10/10/2016   Procedure: ICD Implant;  Surgeon: Will Gladis Norton, MD;  Location: [facility] INVASIVE CV LAB;  Service: Cardiovascular;  Laterality: N/A;   HEMOSTASIS CLIP PLACEMENT  11/16/2023   Procedure: HEMOSTASIS CLIP PLACEMENT;  Surgeon: Rollin Dover, MD;  Location: [facility];  Service: Gastroenterology;;   HEMOSTASIS CLIP PLACEMENT  11/18/2023   Procedure: HEMOSTASIS CLIP PLACEMENT;  Surgeon: Charlanne Groom, MD;  Location: [facility];  Service: Gastroenterology;;   HEMOSTASIS CONTROL  11/18/2023   Procedure: HEMOSTASIS CONTROL;  Surgeon: Charlanne Groom, MD;  Location: [facility];  Service: Gastroenterology;;  PuraStat   IR THORACENTESIS ASP  PLEURAL SPACE W/IMG GUIDE  08/22/2024   LUMBAR LAMINECTOMY/DECOMPRESSION MICRODISCECTOMY N/A 01/18/2018   Procedure: L2-3, L3-4, L-4-5 CENTRAL DECOMPRESSION;  Surgeon: Barbarann Oneil BROCKS, MD;  Location[facility];  Service: Orthopedics;  Laterality: N/A;   LUMBAR LAMINECTOMY/DECOMPRESSION MICRODISCECTOMY N/A 08/12/2020   Procedure: THORACIC ELEVEN LAMINECTOMY;  Surgeon: Louis Shove, MD;  Location: [facility] OR;  Service: Neurosurgery;  Laterality: N/A;   POLYPECTOMY  08/30/2018   Procedure: POLYPECTOMY;  Surgeon: Rollin Dover, MD;  Location: [facility];  Service: Endoscopy;;   POLYPECTOMY  11/16/2023   Procedure: POLYPECTOMY;  Surgeon: Rollin Dover, MD;  Location: [facility];  Service: Gastroenterology;;   RIGHT HEART CATH N/A 05/06/2024   Procedure: RIGHT HEART CATH;  Surgeon: Cherrie Toribio SAUNDERS, MD;  Location: [facility] INVASIVE CV LAB;  Service: Cardiovascular;  Laterality: N/A;   TRANSESOPHAGEAL ECHOCARDIOGRAM (CATH LAB) N/A 06/09/2024   Procedure: TRANSESOPHAGEAL ECHOCARDIOGRAM;  Surgeon: [name] Morene PARAS, MD;  Location: [facility] INVASIVE CV LAB;  Service: Cardiovascular;  Laterality: N/A;    Family Hx:  Family History  Problem Relation Age of Onset   Heart attack Father        died in his 49's   Hypertension Sister    Cancer Brother        uncertain, leg   Hypertension Sister     Social History:  reports that he has never smoked. He has never used smokeless tobacco. He reports that he does not drink alcohol and does not use drugs.  Allergies:  Allergies  Allergen Reactions   Contrast Media [Iodinated Contrast Media] Shortness Of Breath and Nausea And Vomiting   Lisinopril  Cough    Medications: Prior to Admission medications   Medication Sig Start Date End Date Taking? Authorizing Provider  allopurinol  (ZYLOPRIM ) 300 MG tablet Take 900 mg by mouth daily. 06/25/24  Yes [provider]  amiodarone  (PACERONE ) 200 MG tablet Take 1 tablet (200 mg total) by mouth daily. 09/06/22  Yes Leverne Charlies Helling,  PA-C  apixaban  (ELIQUIS ) 5 MG TABS tablet TAKE 1 TABLET BY MOUTH TWICE A DAY 05/01/24  Yes Camnitz, Will Gladis, MD  atorvastatin  (LIPITOR) 40 MG tablet Take 40 mg by mouth daily.   Yes [provider]  cholecalciferol  (VITAMIN D3) 25 MCG (1000 UNIT) tablet Take 1,000 Units by mouth daily. 06/13/23  Yes [provider]  ferrous sulfate  325 (65 FE) MG tablet Take 325 mg by mouth daily. 11/08/23  Yes [provider]  furosemide  (LASIX ) 20 MG tablet Take 20 mg by mouth daily. 07/28/24  Yes [provider]  furosemide  (LASIX ) 40 MG tablet Take 40 mg by mouth daily. 07/29/24  Yes [provider]  olmesartan (BENICAR) 20 MG tablet Take 20 mg by mouth daily. 08/21/24  Yes [provider]  polyethylene glycol powder (GLYCOLAX /MIRALAX ) 17 GM/SCOOP powder Take 17 g by mouth daily. Patient taking differently: Take 17 g by mouth daily as needed for mild constipation. 06/25/24  Yes Desiderio Chew, PA-C  potassium chloride  SA (KLOR-CON  M) 20 MEQ tablet Take 1 tablet (20 mEq total) by mouth daily. 06/25/24  Yes Desiderio Chew, PA-C  zolpidem  (AMBIEN ) 5 MG tablet Take 5 mg by mouth at bedtime. 07/28/24  Yes [provider]  tiZANidine (ZANAFLEX) 2 MG tablet Take 2 mg by mouth 3 (three) times daily. Patient not taking: Reported on 08/23/2024 08/20/24   [provider]    I have reviewed the patient's current and reported prior to admission medications.  Labs:     Latest Ref Rng & Units 08/26/2024    1:32 PM 08/26/2024    3:45 AM 08/25/2024    9:26 AM  BMP  Glucose 70 - 99 mg/dL  886  85   BUN 8 - 23 mg/dL  81  70   Creatinine 9.38 - 1.24 mg/dL 4.98  5.67  6.21   Sodium 135 - 145 mmol/L  131  133   Potassium 3.5 - 5.1 mmol/L  5.1  5.2   Chloride 98 - 111 mmol/L  101  101   CO2 22 - 32 mmol/L  20  24   Calcium  8.9 - 10.3 mg/dL  9.6  9.6     Urinalysis    Component Value Date/Time   COLORURINE AMBER (A) 07/10/2024 0407    APPEARANCEUR HAZY (A) 07/10/2024 0407   LABSPEC 1.013 07/10/2024 0407   PHURINE 5.0 07/10/2024 0407   GLUCOSEU NEGATIVE 07/10/2024 0407   HGBUR NEGATIVE 07/10/2024 0407   BILIRUBINUR NEGATIVE 07/10/2024 0407   KETONESUR NEGATIVE 07/10/2024 0407   PROTEINUR 100 (A) 07/10/2024 0407   NITRITE NEGATIVE 07/10/2024 0407   LEUKOCYTESUR SMALL (A) 07/10/2024 0407     ROS:  Pertinent items noted in HPI and remainder of comprehensive ROS otherwise negative.  Physical Exam: Vitals:   08/26/24 1250 08/26/24 1255  BP: (!) 100/40 (!) 98/35  Pulse:    Resp:  (!) 23  Temp:    SpO2:       General: elderly male in chair in NAD - wearing back brace HEENT: NCAT Eyes: EOMI sclera anicteric Neck: supple trachea midline  Heart: S1S2 no rub Lungs: clear and unlabored; reduced breath sounds; on room air Abdomen: softly distended/obese; nontender Extremities: 3+ edema bilateral lower extremities  Skin: skin ulcer on lower leg; no rash on extremities exposed Neuro: alert and oriented x 3 provides hx and follows commands Psych: normal mood and affect GU - no foley  Assessment/Plan:  # AKI - Cardiorenal and may be intravascularly  dry per CVP.  He is peripherally overloaded but with anemia and mild hypoalbuminemia.  Urine output seems to have dropped with hypotension  - agree with stopping milrinone  for now  - NS 250 mL once  - Giving albumin  once now  - Stop lasix  for now as developing oligoanuric AKI with recent hypotension after milrinone  - strict ins/outs are ordered but not being obtained - CHF team and cardiology are concerned about his ability to tolerate dialysis.  I agree.  He would hope to avoid dialysis but would want it if indicated   # Chronic heart failure with reduced Ejection fraction - note TTE 04/2024 with LVEF 35-40% and grade 2 diastolic dysfunction as well as moderately elevated pulmonary artery systolic pressure - milrinone  per cardiology discretion - currently off for  hypotension and agree - pause lasix  for now with hypotension - elevate legs   # Hyperkalemia - Transitioned to renal diabetic diet  - lokelma  once today   # CKD stage IV - Follows with Dr. [name] - Baseline Cr 2-3 - for reference, no SGLT2 inhibitor due to hx of UTI's  # Anemia of CKD - Will give aranesp 40 mcg once today to optimize anemia   # Right-sided Pleural effusion  - s/p thoracentesis of 500 mL bloody fluid with IR  Disposition - continue inpatient monitoring.  Thank you for the consult.  Please do not hesitate to contact me with any questions regarding our patient.   Dean [name] 08/26/2024, 3:36 PM

## 2024-08-26 NOTE — Progress Notes (Signed)
   08/26/24 1501  TOC Brief Assessment  Insurance and Status Reviewed  Patient has primary care physician Yes  Home environment has been reviewed home w/ family  Prior level of function: uses AD (has RW and w/c)  Prior/Current Home Services No current home services  Social Drivers of Health Review SDOH reviewed no interventions necessary  Readmission risk has been reviewed Yes  Transition of care needs no transition of care needs at this time     Note pt remains on IV lasix  and Milrinone  drip,  Inpatient Care Management (ICM) will continue to monitor patient advancement through interdisciplinary progression rounds. If new patient transition needs arise, please place a ICM (CM/CSW) consult.

## 2024-08-27 ENCOUNTER — Inpatient Hospital Stay (HOSPITAL_COMMUNITY)

## 2024-08-27 DIAGNOSIS — I1310 Hypertensive heart and chronic kidney disease without heart failure, with stage 1 through stage 4 chronic kidney disease, or unspecified chronic kidney disease: Secondary | ICD-10-CM

## 2024-08-27 DIAGNOSIS — I130 Hypertensive heart and chronic kidney disease with heart failure and stage 1 through stage 4 chronic kidney disease, or unspecified chronic kidney disease: Secondary | ICD-10-CM | POA: Diagnosis not present

## 2024-08-27 DIAGNOSIS — T07XXXA Unspecified multiple injuries, initial encounter: Secondary | ICD-10-CM | POA: Diagnosis not present

## 2024-08-27 DIAGNOSIS — I5082 Biventricular heart failure: Secondary | ICD-10-CM | POA: Diagnosis not present

## 2024-08-27 LAB — URINALYSIS, ROUTINE W REFLEX MICROSCOPIC
Bilirubin Urine: NEGATIVE
Glucose, UA: NEGATIVE mg/dL
Hgb urine dipstick: NEGATIVE
Ketones, ur: NEGATIVE mg/dL
Nitrite: NEGATIVE
Protein, ur: NEGATIVE mg/dL
Specific Gravity, Urine: 1.016 (ref 1.005–1.030)
pH: 5 (ref 5.0–8.0)

## 2024-08-27 LAB — COOXEMETRY PANEL
Carboxyhemoglobin: 2.1 % — ABNORMAL HIGH (ref 0.5–1.5)
Methemoglobin: <0.7 % (ref 0.0–1.5)
O2 Saturation: 76.4 %
Total hemoglobin: 8.1 g/dL — ABNORMAL LOW (ref 12.0–16.0)

## 2024-08-27 LAB — LACTIC ACID, PLASMA: Lactic Acid, Venous: 0.8 mmol/L (ref 0.5–1.9)

## 2024-08-27 LAB — CBC
HCT: 23.5 % — ABNORMAL LOW (ref 39.0–52.0)
Hemoglobin: 7.4 g/dL — ABNORMAL LOW (ref 13.0–17.0)
MCH: 29 pg (ref 26.0–34.0)
MCHC: 31.5 g/dL (ref 30.0–36.0)
MCV: 92.2 fL (ref 80.0–100.0)
Platelets: 184 K/uL (ref 150–400)
RBC: 2.55 MIL/uL — ABNORMAL LOW (ref 4.22–5.81)
RDW: 14.7 % (ref 11.5–15.5)
WBC: 8.8 K/uL (ref 4.0–10.5)
nRBC: 0 % (ref 0.0–0.2)

## 2024-08-27 LAB — MAGNESIUM: Magnesium: 1.7 mg/dL (ref 1.7–2.4)

## 2024-08-27 LAB — BASIC METABOLIC PANEL WITH GFR
Anion gap: 8 (ref 5–15)
BUN: 84 mg/dL — ABNORMAL HIGH (ref 8–23)
CO2: 21 mmol/L — ABNORMAL LOW (ref 22–32)
Calcium: 9.7 mg/dL (ref 8.9–10.3)
Chloride: 102 mmol/L (ref 98–111)
Creatinine, Ser: 4.89 mg/dL — ABNORMAL HIGH (ref 0.61–1.24)
GFR, Estimated: 12 mL/min — ABNORMAL LOW (ref >60)
Glucose, Bld: 104 mg/dL — ABNORMAL HIGH (ref 70–99)
Potassium: 5.7 mmol/L — ABNORMAL HIGH (ref 3.5–5.1)
Sodium: 131 mmol/L — ABNORMAL LOW (ref 135–145)

## 2024-08-27 MED ORDER — LIDOCAINE-EPINEPHRINE 1 %-1:100000 IJ SOLN
INTRAMUSCULAR | Status: AC
  Filled 2024-08-27: qty 1

## 2024-08-27 MED ORDER — SODIUM BICARBONATE 8.4 % IV SOLN
50.0000 meq | Freq: Once | INTRAVENOUS | Status: AC
  Administered 2024-08-27: 50 meq via INTRAVENOUS
  Filled 2024-08-27: qty 50

## 2024-08-27 MED ORDER — COLLAGENASE 250 UNIT/GM EX OINT
TOPICAL_OINTMENT | Freq: Every day | CUTANEOUS | Status: DC
  Filled 2024-08-27: qty 30

## 2024-08-27 MED ORDER — SODIUM ZIRCONIUM CYCLOSILICATE 10 G PO PACK
10.0000 g | PACK | Freq: Once | ORAL | Status: AC
  Administered 2024-08-27: 10 g via ORAL
  Filled 2024-08-27: qty 1

## 2024-08-27 MED ORDER — ALBUMIN HUMAN 25 % IV SOLN
25.0000 g | Freq: Once | INTRAVENOUS | Status: AC
  Administered 2024-08-27: 25 g via INTRAVENOUS
  Filled 2024-08-27: qty 100

## 2024-08-27 MED ORDER — HEPARIN SODIUM (PORCINE) 5000 UNIT/ML IJ SOLN
5000.0000 [IU] | Freq: Three times a day (TID) | INTRAMUSCULAR | Status: DC
  Administered 2024-08-27 – 2024-09-05 (×26): 5000 [IU] via SUBCUTANEOUS
  Filled 2024-08-27 (×26): qty 1

## 2024-08-27 MED ORDER — MEDIHONEY WOUND/BURN DRESSING EX PSTE
1.0000 | PASTE | Freq: Every day | CUTANEOUS | Status: DC
  Filled 2024-08-27: qty 44

## 2024-08-27 MED ORDER — SODIUM CHLORIDE 0.9 % IV BOLUS
250.0000 mL | Freq: Once | INTRAVENOUS | Status: AC
  Administered 2024-08-27: 250 mL via INTRAVENOUS

## 2024-08-27 MED ORDER — SODIUM CHLORIDE 0.9 % IV BOLUS
500.0000 mL | Freq: Once | INTRAVENOUS | Status: AC
  Administered 2024-08-27: 500 mL via INTRAVENOUS

## 2024-08-27 MED ORDER — HYDROCERIN EX CREA
TOPICAL_CREAM | Freq: Two times a day (BID) | CUTANEOUS | Status: DC
  Administered 2024-09-05 – 2024-09-06 (×2): 1 via TOPICAL
  Filled 2024-08-27: qty 113

## 2024-08-27 NOTE — Progress Notes (Addendum)
 Subjective: CC: Notes reviewed yesterday.   Patient with worsening creatinine yesterday, low to no urine output, bladder scan was zero and patient became hypotensive. Nephrology consulted. Milrinone  stopped and patient was given 250 mL saline bolus and albumin .   Today Cr 4.89 (peak 5.01), Na 131, K 5.7, hgb 7.4 (8.4), wbc wnl. Lactic wnl on last check. Currently written for 500cc IVF bolus, lokelma  and sodium bicarb. CVP pending.   Patient was on RA this am. Just got back in bed. Now with some sob and placed on 2L for hypoxia in the upper 80's. No cough or other complaints.    Objective: Vital signs in last 24 hours: Temp:  [97.5 F (36.4 C)-97.8 F (36.6 C)] 97.8 F (36.6 C) (12/03 0800) Pulse Rate:  [77-85] 83 (12/03 0800) Resp:  [16-23] 16 (12/03 0800) BP: (92-119)/(28-65) 113/40 (12/03 0800) SpO2:  [93 %-97 %] 93 % (12/03 0800) Last BM Date : 08/26/24  Intake/Output from previous day: 12/02 0701 - 12/03 0700 In: 250 [P.O.:250] Out: 250 [Urine:250] Intake/Output this shift: No intake/output data recorded.  PE: Gen:  Alert, NAD, pleasant, sitting up in chair w/ TLSO brace on  Card:  Reg rate Pulm:  Tachypnea noted. Decreased breath sounds on right slightly compared to left but lung sounds heard in all fields b/l. On 2L Ext:  Noted bilateral lower extremity edema  Lab Results:  Recent Labs    08/26/24 0345 08/27/24 0431  WBC 7.7 8.8  HGB 8.4* 7.4*  HCT 26.2* 23.5*  PLT 200 184   BMET Recent Labs    08/26/24 0345 08/26/24 1332 08/27/24 0431  NA 131*  --  131*  K 5.1  --  5.7*  CL 101  --  102  CO2 20*  --  21*  GLUCOSE 113*  --  104*  BUN 81*  --  84*  CREATININE 4.32* 5.01* 4.89*  CALCIUM  9.6  --  9.7   PT/INR No results for input(s): LABPROT, INR in the last 72 hours. CMP     Component Value Date/Time   NA 131 (L) 08/27/2024 0431   NA 140 06/04/2024 1445   K 5.7 (H) 08/27/2024 0431   CL 102 08/27/2024 0431   CO2 21 (L) 08/27/2024  0431   GLUCOSE 104 (H) 08/27/2024 0431   BUN 84 (H) 08/27/2024 0431   BUN 23 06/04/2024 1445   CREATININE 4.89 (H) 08/27/2024 0431   CALCIUM  9.7 08/27/2024 0431   PROT 6.7 08/25/2024 1351   PROT 6.6 05/16/2024 1308   ALBUMIN  3.1 (L) 08/25/2024 1351   ALBUMIN  3.5 (L) 05/16/2024 1308   AST 21 08/25/2024 1351   ALT 15 08/25/2024 1351   ALKPHOS 71 08/25/2024 1351   BILITOT 1.4 (H) 08/25/2024 1351   BILITOT 2.0 (H) 05/16/2024 1308   GFRNONAA 12 (L) 08/27/2024 0431   GFRAA 59 (L) 11/02/2020 1457   Lipase     Component Value Date/Time   LIPASE 28 04/16/2024 2252    Studies/Results: DG CHEST PORT 1 VIEW Result Date: 08/26/2024 EXAM: 1 VIEW(S) XRAY OF THE CHEST 08/26/2024 05:41:54 AM COMPARISON: 08/24/2024 CLINICAL HISTORY: Hemothorax FINDINGS: LINES, TUBES AND DEVICES: Right PICC line terminates at superior cavoatrial junction. Stable left single lead pacemaker in place. LUNGS AND PLEURA: Similar right pleural effusion and right basilar opacity. No pneumothorax. HEART AND MEDIASTINUM: Stable cardiomegaly. Aortic atherosclerosis. BONES AND SOFT TISSUES: No acute osseous abnormality. IMPRESSION: 1. Similar right pleural effusion and right basilar opacity, unchanged from  prior study. Electronically signed by: Waddell Calk MD 08/26/2024 05:49 AM EST RP Workstation: [id number]   US  EKG SITE RITE Result Date: 08/25/2024 If Site Rite image not attached, placement could not be confirmed due to current cardiac rhythm.   Anti-infectives: Anti-infectives (From admission, onward)    None        Assessment/Plan Fall from wheelchair T6 vb fx - Per NSGY, TLSO brace, outpatient follow up in 2 weeks. Moderate right pleural effusion - Did have small bilateral pleural effusions on CT in mid October.  - S/p thoracentesis 11/28 with bloody output. Cx NGTD - HF team following. Currently holding off on diuretics with AKI - CXR today with worsening pleural effusion. Will ask IR to repeat  thoracentesis.  - Cont aggressive pulmonary toilet, mobilize as able    FEN: Renal VTE: SCD's, Lovenox , hold DOAC for now Foley: none ID: none Dispo: Progressive care   - Per primary -  Chronic heart failure (ischemic cardiomyopathy) - HF following Hx VT/VF arrest and AICD Hx hypertension Hx OSA on CPAP Hx A-fib on Eliquis  Hx type 2 diabetes  AKI - Per Nephrology  Multilevel foraminal/spinal stenoses/ severe degenerative disease  dense liver suggesting metallic or amiodarone  toxicity Caridomegaly and aortic/acoronary atherosclerosis Sigmoid diverticulosis  I reviewed nursing notes, Consultant (HF) notes, hospitalist notes, last 24 h vitals and pain scores, last 48 h intake and output, last 24 h labs and trends, and last 24 h imaging results.    LOS: 5 days    Dean [name] Shaper, [facility] 08/27/2024, 9:19 AM Please see Amion for pager number during day hours 7:00am-4:30pm

## 2024-08-27 NOTE — Progress Notes (Signed)
 PT Cancellation Note  Patient Details Name: Dean Pember Sr. MRN: [id number] DOB: [birth year]   Cancelled Treatment:    Reason Eval/Treat Not Completed: Patient not medically ready  Per RN, about to give a bolus due to low BP, MAP. Activity currently not appropriate.    Dean [name], PT Acute Rehabilitation Services  Office [phone]   Dean SHAUNNA Soja 08/27/2024, 11:15 AM

## 2024-08-27 NOTE — Consult Note (Signed)
 This remote consultation was conducted using EMR wound digital images downloaded by the staff member. Pertinent information was reviewed in order to determine recommendations. Coordinated care with primary nurse. A comparison of images were seen.   WOC Nurse Consult Note: Reason for Consult: RLE lateral Wound type: recalcitrant; trauma, recent of fall Pressure Injury POA: No Wound bed: non-viable tissue; oddly shaped Drainage some serosanguinous fluid on image Periwound: jagged edges, some induration, scar tissue Patient would benefit from autolytic debridement; possible heavy bioburden over wound surface, mild compression long standing cardiac issues, no allergies to honey on chart, edema. Current comorbidities may cause wound healing delay. Dressing procedure/placement/frequency:  Cleanse RLE Lateral wound with normal saline, pat dry with gauze, apply a thick amount of Medihoney- medical grade honey gel (pharmacy) over the entire wound, cover with petrolatum  gauze #[id number], 4x4's, rolled gauze and secure in place with wide tape, ACE bandage wrap from above toes to slightly below knee. Use even stretch to patient's comfort level.    08/25/24 RLE   WOC will not follow and will remove patient from census task list. Please reconsult if wound worsens in condition and notify provider.   Sherrilyn Hals MSN RN CWOCN WOC [employer]  [phone] (Available from 7-3 pm Mon-Friday)  .

## 2024-08-27 NOTE — Consult Note (Incomplete)
 NAME:  Dean Leifheit., MRN:  [id number], DOB:  [birth year], LOS: 5 ADMISSION DATE:  08/22/2024 CONSULTATION DATE:  08/27/2024 REFERRING MD:  Caleen - TRH, CHIEF COMPLAINT:  Cardiogenic shock   History of Present Illness:  73 year old man who presented to [facility] 11/28 for back pain after GLF from wheelchair. PMHx significant for HTN, HLD, CAD, cardiac arrest (VT/VF 2017), ICM s/p AICD placement, HFrEF (Echo 04/2024 EF 35-40%, G2DD, moderate to severe MR), PAF (on Eliquis ), OSA, T2DM, CKD stage IV. Recent admission 10/16-10/25 for HF exacerbation.  Patient initially presented to [facility] for back pain after a ground level fall from his wheelchair down a flight of stairs (8 stairs in total). Initially SOB, but back pain developed the next day. On bloodthinners (Eliquis ) for PAF. Labs on admission demonstrated AKI. Imaging noted acute T6 fracture, R moderate pleural effusion. CCS was consulted with recommendation for thoracentesis. Underwent thoracentesis with IR 11/28 with sanguinous fluid removed. NSGY consulted and recommended TLSO brace, conservative management/nonoperative intervention. Cardiology was consulted 12/1 for decompensated HF. PICC placed 12/1 for central access and Co-ox monitoring. Nephrology was consulted 12/2 for oliguric AKI on CKD, possible cardiorenal syndrome.  On 12/3, patient was noted to have ongoing oliguria, rising K and persistently elevated Cr. Additionally, noted to be hypotensive to SBP 90s with slight increase in RR. PCCM consulted for evaluation.  Pertinent Medical History:   Past Medical History:  Diagnosis Date   AICD (automatic cardioverter/defibrillator) present 10/10/2016   a. 09/2016 s/p MDT Visia AF MRI single lead ICD (ser # [id number]).   Arthritis    maybe in his spine (09/05/2016)   Asthma    pt. denies   Cardiac arrest (HCC)    a. 08/2016 VT/VF Arrest-->09/2016 s/p MDT Visia AF MRI single lead ICD (ser # [id number]).   Chronic HFrEF (heart failure  with reduced ejection fraction) (HCC)    a. 08/2016 Echo: EF 35-40%; b. 04/2018 Echo: EF 30-35%; c. 04/2020 Echo: EF 30-35%; d. 11/2021 Echo: EF 40-45%, GrI DD, nl RV fxn, sev dil LA, mild-mod MR, mild-mod AI, Ao root 38mm.   Coronary artery disease    a. 08/2016 VT Arrest/Cath: LM nl, LAD nl, LCX nl, RGCA 90p, 100d w/ L->R collats, EF 35-45%.   Gout    Hyperlipidemia LDL goal <70    Hypertension    Ischemic cardiomyopathy    a. 08/2016 Echo: EF 35-40%; b. 04/2018 Echo: EF 30-35%; c. 04/2020 Echo: EF 30-35%; d. 11/2021 Echo: EF 40-45%.   LBBB (left bundle branch block)    OSA on CPAP    does not wear CPAP   PAF (paroxysmal atrial fibrillation) (HCC)    a. CHA2DS2VASc = 5-->Eliquis .   Type II diabetes mellitus (HCC)    Significant Hospital Events: Including procedures, antibiotic start and stop dates in addition to other pertinent events     Interim History / Subjective:  PCCM consulted for evaluation On my arrival, patient was sitting at EOB, appeared comfortable Answering questions appropriately Denies SOB, SpO2 93-97% on 3LNC, no visible increased WOB Wide pulse pressure, SBP 110s-120s, DBP 30s-40s, MAP 63 CVP 20s  Objective:  Blood pressure (!) 82/66, pulse 77, temperature 97.8 F (36.6 C), temperature source Oral, resp. rate 19, height 5' 6 (1.676 m), weight 97 kg, SpO2 96%. CVP:  [1 mmHg-22 mmHg] 22 mmHg      Intake/Output Summary (Last 24 hours) at 08/27/2024 1915 Last data filed at 08/27/2024 1134 Gross per 24 hour  Intake 229.38 ml  Output  250 ml  Net -20.62 ml   Filed Weights   08/22/24 0259 08/25/24 0500 08/27/24 0728  Weight: 83.9 kg 91.9 kg 97 kg   Physical Examination: General: Acute-on-chronically ill-appearing elderly man in NAD. Pleasant and conversant. HEENT: [place]/AT, anicteric sclera, PERRL, moist mucous membranes. Neuro: Awake, oriented x 4. Responds to verbal stimuli. Following commands consistently. Moves all 4 extremities spontaneously. Generalized  weakness.  CV: RRR, +II/VI systolic murmur. PULM: Breathing even and unlabored on 3LNC. Lung fields diminished throughout, R significantly > L. GI: Soft, nontender, nondistended. Normoactive bowel sounds. Extremities: Bilateral symmetric 2-3+ pitting LE edema noted. Edema wraps in place. Skin: Warm/dry, no rashes.  Resolved Hospital Problem List:    Assessment & Plan:  73 year old man who presented to [facility] 11/28 for back pain after GLF from wheelchair. PMHx significant for HTN, HLD, CAD, cardiac arrest (VT/VF 2017), ICM s/p AICD placement, HFrEF (Echo 04/2024 EF 35-40%, G2DD, moderate to severe MR), PAF (on Eliquis ), OSA, T2DM, CKD stage IV.  Cardiology/Nephrology consulted 12/1 and 12/2 for decompensated HF and oliguric AKI on CKD, possible cardiorenal syndrome. Patient was noted to have ongoing oliguria, rising K and persistently elevated Cr 12/3. Transiently hypotensive to SBP 90s with slight increase in RR. PCCM consulted for evaluation.  On my evaluation, patient was sitting at EOB and appeared comfortable with stable vital signs. Remains oliguric with elevated K. Fortunately, no significant increased WOB at present and on stable O2 requirements (3L). Patient feels overall ok and would like to avoid ICU at this time; I feel this is reasonable to monitor closely and reassess as indicated.  Concern for cardiorenal syndrome Acute decompensated HFrEF AKI on CKD stage IV with oliguria S/p GLF - Monitor closely in progressive unit at this time - Low threshold for reconsult/transfer to ICU if clinically worsening - Hemodynamically stable, continue to monitor HR/BP/SpO2/CVP - Cardiac monitoring - Goal SBP > 100, MAP > 60 (low due to wide pulse pressure) - Trend Co-ox - Consider inotrope resumption - May need HD, avoiding as able per patient requrest; fortunately no significant WOB - Trend BMP - Replete electrolytes as indicated - Monitor I&Os - Avoid nephrotoxic agents as able - Ensure  adequate renal perfusion  Remainder per Primary team.  Labs:  CBC: Recent Labs  Lab 08/22/24 0418 08/22/24 0903 08/24/24 0318 08/25/24 0926 08/26/24 0345 08/27/24 0431  WBC 6.8 8.4 6.2 6.7 7.7 8.8  NEUTROABS 5.0  --   --   --   --   --   HGB 9.0* 9.0* 8.2* 8.8* 8.4* 7.4*  HCT 28.1* 28.3* 25.5* 28.4* 26.2* 23.5*  MCV 92.1 92.8 92.1 93.7 91.9 92.2  PLT 142* 156 134* 190 200 184   Basic Metabolic Panel: Recent Labs  Lab 08/22/24 0418 08/22/24 0903 08/24/24 0318 08/25/24 0926 08/26/24 0345 08/26/24 1332 08/27/24 0431  NA 135  --  133* 133* 131*  --  131*  K 4.1  --  4.6 5.2* 5.1  --  5.7*  CL 103  --  103 101 101  --  102  CO2 19*  --  19* 24 20*  --  21*  GLUCOSE 105*  --  104* 85 113*  --  104*  BUN 44*  --  57* 70* 81*  --  84*  CREATININE 2.83*   < > 3.77* 3.78* 4.32* 5.01* 4.89*  CALCIUM  9.5  --  9.2 9.6 9.6  --  9.7  MG  --   --   --   --  1.8  --  1.7   < > = values in this interval not displayed.   GFR: Estimated Creatinine Clearance: 14.5 mL/min (A) (by C-G formula based on SCr of 4.89 mg/dL (H)). Recent Labs  Lab 08/22/24 0903 08/24/24 0318 08/25/24 0926 08/25/24 1351 08/25/24 1607 08/26/24 0345 08/26/24 1332 08/27/24 0431  PROCALCITON 0.44  --   --   --   --   --   --   --   WBC 8.4 6.2 6.7  --   --  7.7  --  8.8  LATICACIDVEN  --   --   --  1.8 1.5  --  1.6  --    Liver Function Tests: Recent Labs  Lab 08/22/24 0418 08/25/24 1351  AST 22 21  ALT 15 15  ALKPHOS 53 71  BILITOT 1.5* 1.4*  PROT 6.3* 6.7  ALBUMIN  3.0* 3.1*   No results for input(s): LIPASE, AMYLASE in the last 168 hours. No results for input(s): AMMONIA in the last 168 hours.  ABG:    Component Value Date/Time   PHART 7.236 (L) 08/12/2020 1607   PCO2ART 47.5 08/12/2020 1607   PO2ART 291 (H) 08/12/2020 1607   HCO3 20.4 07/10/2024 0236   TCO2 20 (L) 07/10/2024 0236   TCO2 21 (L) 07/10/2024 0236   ACIDBASEDEF 4.0 (H) 07/10/2024 0236   O2SAT 62.5 08/26/2024 0345    Coagulation Profile: Recent Labs  Lab 08/22/24 0418  INR 1.6*   Cardiac Enzymes: No results for input(s): CKTOTAL, CKMB, CKMBINDEX, TROPONINI in the last 168 hours.  HbA1C: Hgb A1c MFr Bld  Date/Time Value Ref Range Status  05/04/2024 03:33 AM 5.4 4.8 - 5.6 % Final    Comment:    (NOTE)         Prediabetes: 5.7 - 6.4         Diabetes: >6.4         Glycemic control for adults with diabetes: <7.0   11/17/2023 05:19 PM 5.1 4.8 - 5.6 % Final    Comment:    (NOTE) Pre diabetes:          5.7%-6.4%  Diabetes:              >6.4%  Glycemic control for   <7.0% adults with diabetes    CBG: No results for input(s): GLUCAP in the last 168 hours.  Review of Systems:   Review of systems completed with pertinent positives/negatives outlined in above HPI.  Past Medical History:  He,  has a past medical history of AICD (automatic cardioverter/defibrillator) present (10/10/2016), Arthritis, Asthma, Cardiac arrest (HCC), Chronic HFrEF (heart failure with reduced ejection fraction) (HCC), Coronary artery disease, Gout, Hyperlipidemia LDL goal <70, Hypertension, Ischemic cardiomyopathy, LBBB (left bundle branch block), OSA on CPAP, PAF (paroxysmal atrial fibrillation) (HCC), and Type II diabetes mellitus (HCC).   Surgical History:   Past Surgical History:  Procedure Laterality Date   CARDIAC CATHETERIZATION N/A 09/07/2016   Procedure: Left Heart Cath and Coronary Angiography;  Surgeon: Peter M Jordan, MD;  Location: [facility] INVASIVE CV LAB;  Service: Cardiovascular;  Laterality: N/A;   CENTRAL LINE INSERTION  05/06/2024   Procedure: CENTRAL LINE INSERTION;  Surgeon: Cherrie Toribio SAUNDERS, MD;  Location: [facility] INVASIVE CV LAB;  Service: Cardiovascular;;   COLONOSCOPY WITH PROPOFOL  N/A 08/30/2018   Procedure: COLONOSCOPY WITH PROPOFOL ;  Surgeon: Rollin Dover, MD;  Location: [facility];  Service: Endoscopy;  Laterality: N/A;   COLONOSCOPY WITH PROPOFOL  N/A 11/16/2023   Procedure:  COLONOSCOPY WITH PROPOFOL ;  Surgeon: Rollin Dover, MD;  Location: [facility];  Service: Gastroenterology;  Laterality: N/A;   COLONOSCOPY WITH PROPOFOL  N/A 11/18/2023   Procedure: COLONOSCOPY WITH PROPOFOL ;  Surgeon: Charlanne Groom, MD;  Location: [facility];  Service: Gastroenterology;  Laterality: N/A;   EP IMPLANTABLE DEVICE N/A 10/10/2016   Procedure: ICD Implant;  Surgeon: Will Gladis Norton, MD;  Location: [facility] INVASIVE CV LAB;  Service: Cardiovascular;  Laterality: N/A;   HEMOSTASIS CLIP PLACEMENT  11/16/2023   Procedure: HEMOSTASIS CLIP PLACEMENT;  Surgeon: Rollin Dover, MD;  Location: [facility];  Service: Gastroenterology;;   HEMOSTASIS CLIP PLACEMENT  11/18/2023   Procedure: HEMOSTASIS CLIP PLACEMENT;  Surgeon: Charlanne Groom, MD;  Location: [facility];  Service: Gastroenterology;;   HEMOSTASIS CONTROL  11/18/2023   Procedure: HEMOSTASIS CONTROL;  Surgeon: Charlanne Groom, MD;  Location: [facility];  Service: Gastroenterology;;  PuraStat   IR THORACENTESIS ASP PLEURAL SPACE W/IMG GUIDE  08/22/2024   LUMBAR LAMINECTOMY/DECOMPRESSION MICRODISCECTOMY N/A 01/18/2018   Procedure: L2-3, L3-4, L-4-5 CENTRAL DECOMPRESSION;  Surgeon: Barbarann Oneil BROCKS, MD;  Location: [facility] OR;  Service: Orthopedics;  Laterality: N/A;   LUMBAR LAMINECTOMY/DECOMPRESSION MICRODISCECTOMY N/A 08/12/2020   Procedure: THORACIC ELEVEN LAMINECTOMY;  Surgeon: Louis Shove, MD;  Location: [facility] OR;  Service: Neurosurgery;  Laterality: N/A;   POLYPECTOMY  08/30/2018   Procedure: POLYPECTOMY;  Surgeon: Rollin Dover, MD;  Location: [facility];  Service: Endoscopy;;   POLYPECTOMY  11/16/2023   Procedure: POLYPECTOMY;  Surgeon: Rollin Dover, MD;  Location: [facility];  Service: Gastroenterology;;   RIGHT HEART CATH N/A 05/06/2024   Procedure: RIGHT HEART CATH;  Surgeon: Cherrie Toribio SAUNDERS, MD;  Location: [facility] INVASIVE CV LAB;  Service: Cardiovascular;  Laterality: N/A;   TRANSESOPHAGEAL ECHOCARDIOGRAM (CATH LAB) N/A 06/09/2024    Procedure: TRANSESOPHAGEAL ECHOCARDIOGRAM;  Surgeon: [name] Morene PARAS, MD;  Location: [facility] INVASIVE CV LAB;  Service: Cardiovascular;  Laterality: N/A;   Social History:   reports that he has never smoked. He has never used smokeless tobacco. He reports that he does not drink alcohol and does not use drugs.   Family History:  His family history includes Cancer in his brother; Heart attack in his father; Hypertension in his sister and sister.   Allergies: Allergies  Allergen Reactions   Contrast Media [Iodinated Contrast Media] Shortness Of Breath and Nausea And Vomiting   Lisinopril  Cough    Home Medications: Prior to Admission medications   Medication Sig Start Date End Date Taking? Authorizing Provider  allopurinol  (ZYLOPRIM ) 300 MG tablet Take 900 mg by mouth daily. 06/25/24  Yes [provider]  amiodarone  (PACERONE ) 200 MG tablet Take 1 tablet (200 mg total) by mouth daily. 09/06/22  Yes Leverne Charlies Helling, PA-C  apixaban  (ELIQUIS ) 5 MG TABS tablet TAKE 1 TABLET BY MOUTH TWICE A DAY 05/01/24  Yes Camnitz, Will Gladis, MD  atorvastatin  (LIPITOR) 40 MG tablet Take 40 mg by mouth daily.   Yes [provider]  cholecalciferol  (VITAMIN D3) 25 MCG (1000 UNIT) tablet Take 1,000 Units by mouth daily. 06/13/23  Yes [provider]  ferrous sulfate  325 (65 FE) MG tablet Take 325 mg by mouth daily. 11/08/23  Yes [provider]  furosemide  (LASIX ) 20 MG tablet Take 20 mg by mouth daily. 07/28/24  Yes [provider]  furosemide  (LASIX ) 40 MG tablet Take 40 mg by mouth daily. 07/29/24  Yes [provider]  olmesartan (BENICAR) 20 MG tablet Take 20 mg by mouth daily. 08/21/24  Yes [provider]  polyethylene glycol  powder (GLYCOLAX /MIRALAX ) 17 GM/SCOOP powder Take 17 g by mouth daily. Patient taking differently: Take 17 g by mouth daily as needed for mild constipation. 06/25/24  Yes Desiderio Chew, PA-C  potassium chloride  SA (KLOR-CON  M) 20  MEQ tablet Take 1 tablet (20 mEq total) by mouth daily. 06/25/24  Yes Desiderio Chew, PA-C  zolpidem  (AMBIEN ) 5 MG tablet Take 5 mg by mouth at bedtime. 07/28/24  Yes [provider]  tiZANidine (ZANAFLEX) 2 MG tablet Take 2 mg by mouth 3 (three) times daily. Patient not taking: Reported on 08/23/2024 08/20/24   [provider]   Critical care time: N/A   Corean CHRISTELLA Ilah DEVONNA [facility] 08/27/24 7:15 PM  Please see [email or web address] for pager details.  From 7A-7P if no response, please call [phone] After hours, please call ELink [phone]

## 2024-08-27 NOTE — Progress Notes (Signed)
 PROGRESS NOTE    Dean Croft Sr.  FMW:[id number] DOB: 09/16/1951 DOA: 08/22/2024 PCP: [name], MD  Subjective: Patient reports feeling the same as yesterday, states he is urinating. He denied feeling short of breath, but was placed on 2L [place] this am.     Hospital Course: 65M h/o CAD, PAF, ICM, cardiac arrest s/p AICD, HFrEF (EF 35-40% in 04/2024), HTN, HLD, DM2, OSA, and last admission in 06/2204 for HFrEF exacerbation who p/w back pain s/p GLF from wheelchair and found to have AKI and acute T6 fracture. Recent OP Cards tele appointment note on 11/20 suggested multiple missed appointments and recent increase in edema and uptitration of torsemide  to 60mg  daily.  Hospital stay complicated with HF With worsening renal function.  Cardiology as well as renal now following    Assessment and Plan:  Acute T6 fracture, physical deconditioning  - neurosurgery consulted; apprec eval/recs (no surgery indicated; TLSO and OP f/u w/ Dr. [name]) -TLSO brace for now -PT/OT consulted- home health   R pleural effusion RML/RLL consolidation -IR consulted for thoracentesis per surgery-- bloody -- continue to hold eliquis  for now. On heparin  for DVT PPx -repeat chest x ray today showed increased right sided pleural effusion. Per surgery, IR will be reached out for repeat thoracentesis  - on 2L [place], continue to monitor respiratory status   Cardiorenal syndrome with renal failure/Low output biventricular heart failure -cardiology consulted- milrinone  was initially started but now held due to hypotension. 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 With his low pressures, there is a high concern of  tolerating HD  Hyperkalemia - due to AKI on CKD - getting lokelma  x2 today  - low K diet   Liver enhancement on imaging -OP f/u with Cards/GI to discuss amiodarone  toxicity   PAF -PTA amiodarone  200mg  daily and Eliquis  5mg  BID (currently held)  DVT prophylaxis: heparin  injection 5,000 Units Start: 08/27/24 1615  SQ Heparin    Code Status: Limited: Do not attempt resuscitation (DNR) -DNR-LIMITED -Do Not Intubate/DNI  Disposition Plan: TBD Reason for continuing need for hospitalization: not med ready   Objective: Vitals:   08/27/24 0800 08/27/24 1200 08/27/24 1504 08/27/24 1510  BP: (!) 113/40 (!) 98/35 (!) 93/28 (!) 103/33  Pulse: 83 79 79   Resp: 16 (!) 23 (!) 25   Temp: 97.8 F (36.6 C) 98 F (36.7 C) 97.8 F (36.6 C)   TempSrc: Oral Oral Oral   SpO2: 93% 97% 92%   Weight:      Height:        Intake/Output Summary (Last 24 hours) at 08/27/2024 1529 Last data filed at 08/27/2024 1134 Gross per 24 hour  Intake 110 ml  Output 250 ml  Net -140 ml   Filed Weights   08/22/24 0259 08/25/24 0500 08/27/24 0728  Weight: 83.9 kg 91.9 kg 97 kg    Examination:  Physical Exam Constitutional:      Comments: On 2L [place]  Pulmonary:     Breath sounds: Rhonchi present.     Comments: Decreased breath sounds on the right side  Abdominal:     General: There is no distension.     Tenderness: There is no  abdominal tenderness.  Musculoskeletal:     Right lower leg: Edema present.     Left lower leg: Edema present.  Skin:    Comments: Wound on the right shin      Data Reviewed: I have personally reviewed following labs and imaging studies  CBC: Recent Labs  Lab 08/22/24 0418 08/22/24 0903 08/24/24 0318 08/25/24 0926 08/26/24 0345 08/27/24 0431  WBC 6.8 8.4 6.2 6.7 7.7 8.8  NEUTROABS 5.0  --   --   --   --   --   HGB 9.0* 9.0* 8.2* 8.8* 8.4* 7.4*  HCT 28.1* 28.3* 25.5* 28.4* 26.2* 23.5*  MCV 92.1 92.8 92.1 93.7 91.9 92.2  PLT 142* 156 134* 190 200 184   Basic  Metabolic Panel: Recent Labs  Lab 08/22/24 0418 08/22/24 0903 08/24/24 0318 08/25/24 0926 08/26/24 0345 08/26/24 1332 08/27/24 0431  NA 135  --  133* 133* 131*  --  131*  K 4.1  --  4.6 5.2* 5.1  --  5.7*  CL 103  --  103 101 101  --  102  CO2 19*  --  19* 24 20*  --  21*  GLUCOSE 105*  --  104* 85 113*  --  104*  BUN 44*  --  57* 70* 81*  --  84*  CREATININE 2.83*   < > 3.77* 3.78* 4.32* 5.01* 4.89*  CALCIUM  9.5  --  9.2 9.6 9.6  --  9.7  MG  --   --   --   --  1.8  --  1.7   < > = values in this interval not displayed.   GFR: Estimated Creatinine Clearance: 14.5 mL/min (A) (by C-G formula based on SCr of 4.89 mg/dL (H)). Liver Function Tests: Recent Labs  Lab 08/22/24 0418 08/25/24 1351  AST 22 21  ALT 15 15  ALKPHOS 53 71  BILITOT 1.5* 1.4*  PROT 6.3* 6.7  ALBUMIN  3.0* 3.1*   No results for input(s): LIPASE, AMYLASE in the last 168 hours. No results for input(s): AMMONIA in the last 168 hours. Coagulation Profile: Recent Labs  Lab 08/22/24 0418  INR 1.6*   Cardiac Enzymes: No results for input(s): CKTOTAL, CKMB, CKMBINDEX, TROPONINI in the last 168 hours. ProBNP, BNP (last 5 results) Recent Labs    05/04/24 0333 05/16/24 1308 06/25/24 1604 07/10/24 0227 08/22/24 0903  BNP 2,770.3* 618.2* 1,585.3* 1,952.1* 525.0*   HbA1C: No results for input(s): HGBA1C in the last 72 hours. CBG: No results for input(s): GLUCAP in the last 168 hours. Lipid Profile: No results for input(s): CHOL, HDL, LDLCALC, TRIG, CHOLHDL, LDLDIRECT in the last 72 hours. Thyroid Function Tests: No results for input(s): TSH, T4TOTAL, FREET4, T3FREE, THYROIDAB in the last 72 hours. Anemia Panel: No results for input(s): VITAMINB12, FOLATE, FERRITIN, TIBC, IRON, RETICCTPCT in the last 72 hours. Sepsis Labs: Recent Labs  Lab 08/22/24 9096 08/25/24 1351 08/25/24 1607 08/26/24 1332  PROCALCITON 0.44  --   --   --   LATICACIDVEN   --  1.8 1.5 1.6    Recent Results (from the past 240 hours)  Body fluid culture w Gram Stain     Status: None   Collection Time: 08/22/24 12:22 PM   Specimen: Lung, Right; Pleural Fluid  Result Value Ref Range Status   Specimen Description PLEURAL  Final   Special Requests RIGHT LUNG  Final   Gram Stain   Final    RARE WBC PRESENT, PREDOMINANTLY PMN NO ORGANISMS SEEN  Culture   Final    NO GROWTH 3 DAYS Performed at [facility], [address] [place], [place] [address]    Report Status 08/25/2024 FINAL  Final     Radiology Studies: DG CHEST PORT 1 VIEW Result Date: 08/27/2024 CLINICAL DATA:  Shortness of breath.  History of asthma. EXAM: PORTABLE CHEST 1 VIEW COMPARISON:  08/26/2024 FINDINGS: Right PICC tip in the superior vena cava proximally 1 cm superior to the superior cavoatrial junction. Stable left subclavian AICD lead and generator. Stable enlarged cardiac silhouette. Large right pleural effusion, increased. No significant change in associated right basilar atelectasis. Clear left lung. Tortuous and partially calcified thoracic aorta. No acute bony abnormality. IMPRESSION: 1. Large right pleural effusion, increased. 2. Stable cardiomegaly. 3. Right PICC tip in the superior vena cava, proximally 1 cm superior to the superior cavoatrial junction. Electronically Signed   By: Elspeth Bathe M.D.   On: 08/27/2024 14:47   DG CHEST PORT 1 VIEW Result Date: 08/26/2024 EXAM: 1 VIEW(S) XRAY OF THE CHEST 08/26/2024 05:41:54 AM COMPARISON: 08/24/2024 CLINICAL HISTORY: Hemothorax FINDINGS: LINES, TUBES AND DEVICES: Right PICC line terminates at superior cavoatrial junction. Stable left single lead pacemaker in place. LUNGS AND PLEURA: Similar right pleural effusion and right basilar opacity. No pneumothorax. HEART AND MEDIASTINUM: Stable cardiomegaly. Aortic atherosclerosis. BONES AND SOFT TISSUES: No acute osseous abnormality. IMPRESSION: 1. Similar right pleural effusion and right  basilar opacity, unchanged from prior study. Electronically signed by: Waddell Calk MD 08/26/2024 05:49 AM EST RP Workstation: [id number]   US  EKG SITE RITE Result Date: 08/25/2024 If Site Rite image not attached, placement could not be confirmed due to current cardiac rhythm.   Scheduled Meds:  acetaminophen   650 mg Oral Q6H   amiodarone   200 mg Oral Daily   atorvastatin   40 mg Oral Daily   Chlorhexidine  Gluconate Cloth  6 each Topical Daily   collagenase   Topical Daily   heparin  injection (subcutaneous)  5,000 Units Subcutaneous Q8H   hydrocerin   Topical BID   polyethylene glycol  17 g Oral Daily   sodium chloride  flush  10-40 mL Intracatheter Q12H   sodium zirconium cyclosilicate   10 g Oral Once   Continuous Infusions:  milrinone  Stopped (08/26/24 1320)     LOS: 5 days   Time spent: 40 minutes  Casimer Dare, MD  [facility]  08/27/2024, 3:29 PM

## 2024-08-27 NOTE — Progress Notes (Signed)
 [facility] Progress Note  Name: Dean Vu Sr. MRN: [id number] DOB: 1951/05/08  Chief Complaint:  Fall with fracture  Subjective:  He had 250 mL UOP over 12/2 as well as one unmeasured urine void.  He really hopes that he doesn't have to do dialysis.  He would want dialysis if needed in an emergency.  We discussed low K diet - enjoyed a sweet potato yesterday before diet was changed.  No CVP measurement yet - RN will message me same.  They're going to position him better.   Review of systems:  Denies shortness of breath or chest pain  Denies n/v Urinated some overnight  --------------- Background on consult:  Dean Deems Sr. is a 73 y.o. male with a history including CAD, CKD stage IV, paroxysmal afib, hx cardiac arrest s/p AICD, heart failure with reduced EF, and HTN who presented to the hospital after a fall down the stairs.  He was found to have an acute T6 fracture - neurosurgery was consulted and no surgery is indicated at this time.  He was also noted on admission to have a moderate sized right pleural effusion, suggestive of hemorrhagic or proteinaceous content.  Also with incidental finding of enlarged prostate and body wall edema.  He didn't have any contrasted studies noted.  Cardiology had seen the patient and initiated diuretics and milrinone  was added today.  Strict ins/outs are not available.  He had 250 mL UOP over 12/1 as well as three unmeasured urine voids.  He has had one unmeasured urine void today.  He has had some mild hypotension today.  CVP is low or negative per nursing report.  The milrinone  is now off.  Nephrology is consulted for assistance with management of AKI.  He was bladder scanned and had 0 ml.  He had a thoracentesis this admit with 500 mL bloody fluid.  He follows with Dr. [name] and baseline Cr appears to be 2-3.  He has previously seen Dr. [name] in heart failure (most recently seen inpt by Dr. Rolan during a 06/2024 admission).   Per cardiology report, heart failure team is concerned that he may not tolerate dialysis.  He previously was on irbesartan  and torsemide  and has gotten intermittent IV lasix .  He states that when he last spoke with renal after his last admission that his creatinine was at baseline and he was recommended to follow-up as scheduled.  He states urine output has gone down ever since he started hydralazine -isosorbide  combination pill.  He would hope to avoid dialysis if possible.  If he needed it, he thinks he would want to dry but really hopes it doesn't come to that.    Intake/Output Summary (Last 24 hours) at 08/27/2024 0858 Last data filed at 08/27/2024 0412 Gross per 24 hour  Intake 150 ml  Output 250 ml  Net -100 ml    Vitals:  Vitals:   08/26/24 1935 08/26/24 2325 08/27/24 0410 08/27/24 0800  BP: (!) 92/57 (!) 112/44 (!) 119/40 (!) 113/40  Pulse: 82 83 85 83  Resp: 20 (!) 21 (!) 22 16  Temp: 97.7 F (36.5 C) (!) 97.5 F (36.4 C) 97.7 F (36.5 C) 97.8 F (36.6 C)  TempSrc: Oral Oral Oral Oral  SpO2: 97% 95% 95% 93%  Weight:      Height:         Physical Exam:  General adult male in bed in no acute distress, seated in back brace HEENT normocephalic atraumatic extraocular movements intact sclera anicteric  Neck supple trachea midline Lungs clear but reduced to auscultation bilaterally normal work of breathing at rest on room air  Heart S1S2 no rub Abdomen soft nontender obese habitus Extremities bilateral 2-3+ edema with ulcer on right lower leg  Psych normal mood and affect Neuro alert and oriented x 3 provides hx and follows commands GU - no foley in place   Medications reviewed   Labs:     Latest Ref Rng & Units 08/27/2024    4:31 AM 08/26/2024    1:32 PM 08/26/2024    3:45 AM  BMP  Glucose 70 - 99 mg/dL 895   886   BUN 8 - 23 mg/dL 84   81   Creatinine 9.38 - 1.24 mg/dL 5.10  4.98  5.67   Sodium 135 - 145 mmol/L 131   131   Potassium 3.5 - 5.1 mmol/L 5.7   5.1    Chloride 98 - 111 mmol/L 102   101   CO2 22 - 32 mmol/L 21   20   Calcium  8.9 - 10.3 mg/dL 9.7   9.6      Assessment/Plan:    # AKI - Cardiorenal and may be intravascularly dry per CVP.  He is peripherally overloaded but with anemia and mild hypoalbuminemia.  Urine output seems to have dropped with hypotension  - agree with stopping milrinone  for now   - Off lasix  for now as developing oligoanuric AKI with recent hypotension after milrinone  - strict ins/outs and daily weights  - hopeful for possible plateau - Cr 5.01 to 4.89  - NPO after midnight tonight in anticipation of possible dialysis tomorrow.  CHF team and cardiology are concerned about his ability to tolerate dialysis.  I agree.  He would hope to avoid dialysis but would want it if indicated.  Ideally we can avoid.   - Obtain bladder scan and place foley if retains over 300 mL   # Chronic heart failure with reduced Ejection fraction - note TTE 04/2024 with LVEF 35-40% and grade 2 diastolic dysfunction as well as moderately elevated pulmonary artery systolic pressure - milrinone  per cardiology discretion - currently off for hypotension and agree - pause lasix  for now with hypotension - elevate legs  - wrap legs below level of ulcer on right leg and up to knees    # Hyperkalemia - I transitioned him to a renal diabetic diet  - lokelma  x 2 today    # CKD stage IV - Follows with Dr. [name] - Baseline Cr 2-3 - for reference, no SGLT2 inhibitor due to hx of UTI's   # Anemia of CKD - Gave aranesp 40 mcg once on 12/2 to optimize anemia    # Right-sided Pleural effusion  - s/p thoracentesis of 500 mL bloody fluid with IR   Disposition - continue inpatient monitoring     Katheryn JAYSON Saba, MD 08/27/2024 9:30 AM

## 2024-08-27 NOTE — Progress Notes (Signed)
 MD notified about patient's BP and MAP. Patient is asymptomatic. No new orders at this time, except to continue monitoring patient's BP and MAP.   08/27/24 1504  Vitals  Temp 97.8 F (36.6 C)  Temp Source Oral  BP (!) 93/28  MAP (mmHg) (!) 49  BP Location Left Arm  BP Method Automatic  Patient Position (if appropriate) Lying  Pulse Rate 79  Pulse Rate Source Monitor  ECG Heart Rate 80  Resp (!) 25  MEWS COLOR  MEWS Score Color Yellow  Oxygen Therapy  SpO2 92 %  O2 Device Nasal Cannula  O2 Flow Rate (L/min) 3 L/min  Invasive Hemodynamic Monitoring  CVP (mmHg) 12 mmHg  MEWS Score  MEWS Temp 0  MEWS Systolic 1  MEWS Pulse 0  MEWS RR 1  MEWS LOC 0  MEWS Score 2

## 2024-08-27 NOTE — Progress Notes (Addendum)
 DAILY PROGRESS NOTE   Patient Name: Dean Heffern Sr. Date of Encounter: 08/27/2024 Cardiologist: Vinie JAYSON Maxcy, MD  Chief Complaint   No complaints  Patient Profile   Dean Tantillo Sr. is a 73 y.o. 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 Juvenal.   Subjective   Renal function worsened yesterday- CVP initially measured 10-11, but then was noted to be negative number. Bladder scan was zero when the patient had been anuric and hypotensive. Milrinone  stopped - give small amount of IVF by nephrology which I agreed with and albumin . Suspect 3rd spacing. Creatinine peaked at 5.01, now 4.89 today. Potassium 5.7. Lactate 1.6. Hemoglobin 7.4.  250 cc urine  recorded yesterday, but also given a 250 mL saline bolus. BP improved, but still has diastolic hypotension.  CVP was 2 this morning.   Objective   Vitals:   08/26/24 1520 08/26/24 1935 08/26/24 2325 08/27/24 0410  BP: 111/65 (!) 92/57 (!) 112/44 (!) 119/40  Pulse: 77 82 83 85  Resp: 18 20 (!) 21 (!) 22  Temp: 97.6 F (36.4 C) 97.7 F (36.5 C) (!) 97.5 F (36.4 C) 97.7 F (36.5 C)  TempSrc: Oral Oral Oral Oral  SpO2: 95% 97% 95% 95%  Weight:      Height:        Intake/Output Summary (Last 24 hours) at 08/27/2024 0824 Last data filed at 08/27/2024 9587 Gross per 24 hour  Intake 150 ml  Output 250 ml  Net -100 ml   Filed Weights   08/22/24 0259 08/25/24 0500  Weight: 83.9 kg 91.9 kg    Physical Exam   General appearance: alert and no distress Neck: no carotid bruit, no JVD, and thyroid not enlarged, symmetric, no tenderness/mass/nodules Lungs: diminished breath sounds RLL Heart: regular rate and rhythm Abdomen: soft, non-tender; bowel sounds normal; no masses,  no organomegaly and in a back  brace Extremities: edema 3-4+ bilateral pitting edema Neurologic: Grossly normal  Inpatient Medications    Scheduled Meds:  acetaminophen   650 mg Oral Q6H   amiodarone   200 mg Oral Daily   atorvastatin   40 mg Oral Daily   Chlorhexidine  Gluconate Cloth  6 each Topical Daily   enoxaparin  (LOVENOX ) injection  30 mg Subcutaneous Q24H   polyethylene glycol  17 g Oral Daily   sodium chloride  flush  10-40 mL Intracatheter Q12H    Continuous Infusions:  milrinone  Stopped (08/26/24 1320)    PRN Meds: HYDROmorphone  (DILAUDID ) injection, methocarbamol , oxyCODONE , sodium chloride  flush   Labs   Results for orders placed or performed during the hospital encounter of 08/22/24 (from the past 48 hours)  CBC     Status: Abnormal   Collection Time: 08/25/24  9:26 AM  Result Value Ref Range   WBC 6.7 4.0 - 10.5 K/uL   RBC 3.03 (L) 4.22 - 5.81 MIL/uL   Hemoglobin 8.8 (L) 13.0 - 17.0 g/dL   HCT 71.5 (L) 60.9 - 47.9 %   MCV 93.7 80.0 - 100.0 fL   MCH 29.0 26.0 - 34.0 pg   MCHC 31.0 30.0 - 36.0 g/dL   RDW 85.2 88.4 - 84.4 %   Platelets 190 150 - 400 K/uL   nRBC 0.0 0.0 - 0.2 %    Comment: Performed at [facility], [address] [place], [place] [address]  Basic metabolic panel     Status: Abnormal   Collection Time: 08/25/24  9:26 AM  Result Value Ref Range   Sodium 133 (L) 135 - 145 mmol/L   Potassium 5.2 (H) 3.5 - 5.1 mmol/L   Chloride 101 98 - 111 mmol/L   CO2 24 22 - 32 mmol/L   Glucose, Bld 85 70 - 99 mg/dL    Comment: Glucose reference range applies only to samples taken after fasting for at least 8 hours.   BUN 70 (H) 8 - 23 mg/dL   Creatinine, Ser 6.21 (H) 0.61 - 1.24 mg/dL   Calcium  9.6 8.9 - 10.3 mg/dL   GFR, Estimated 16 (L) >60 mL/min    Comment: (NOTE) Calculated using the CKD-EPI Creatinine Equation (2021)    Anion gap 8 5 - 15    Comment: Performed at [facility], [address], [place], [place] [address]  Lactic acid, plasma     Status: None    Collection Time: 08/25/24  1:51 PM  Result Value Ref Range   Lactic Acid, Venous 1.8 0.5 - 1.9 mmol/L    Comment: Performed at [facility], [address], [place], [place] [address]  Hepatic function panel     Status: Abnormal   Collection Time: 08/25/24  1:51 PM  Result Value Ref Range   Total Protein 6.7 6.5 - 8.1 g/dL   Albumin  3.1 (L) 3.5 - 5.0 g/dL   AST 21 15 - 41 U/L   ALT 15 0 - 44 U/L   Alkaline Phosphatase 71 38 - 126 U/L   Total Bilirubin 1.4 (H) 0.0 - 1.2 mg/dL   Bilirubin, Direct 0.6 (H) 0.0 - 0.2 mg/dL   Indirect Bilirubin 0.8 0.3 - 0.9 mg/dL    Comment: Performed at [facility], [address], [place], [place] [address]  Lactic acid, plasma     Status: None   Collection Time: 08/25/24  4:07 PM  Result Value Ref Range   Lactic Acid, Venous 1.5 0.5 - 1.9 mmol/L    Comment: Performed at [facility], [address] [place], [place] [address]  Cooxemetry Panel (carboxy, met, total hgb, O2 sat)     Status: Abnormal   Collection Time: 08/25/24  7:50 PM  Result Value Ref Range   Total hemoglobin 8.8 (L) 12.0 - 16.0 g/dL   O2 Saturation 40.7 %   Carboxyhemoglobin 2.3 (H) 0.5 - 1.5 %   Methemoglobin <0.7 0.0 - 1.5 %    Comment: Performed at [facility], [address] [place], [place] [address]  CBC     Status: Abnormal   Collection Time: 08/26/24  3:45 AM  Result Value Ref Range   WBC 7.7 4.0 - 10.5 K/uL   RBC 2.85 (L) 4.22 - 5.81 MIL/uL   Hemoglobin 8.4 (L) 13.0 - 17.0 g/dL   HCT 73.7 (L) 60.9 - 47.9 %   MCV 91.9 80.0 - 100.0 fL   MCH 29.5 26.0 - 34.0 pg   MCHC 32.1 30.0 - 36.0 g/dL   RDW 85.3 88.4 - 84.4 %   Platelets 200 150 - 400 K/uL   nRBC 0.0 0.0 - 0.2 %    Comment: Performed at [facility], [address], [place], [place] [address]  Basic metabolic panel     Status: Abnormal   Collection Time: 08/26/24  3:45 AM  Result Value Ref Range  Sodium 131 (L) 135 - 145 mmol/L   Potassium 5.1 3.5 - 5.1 mmol/L   Chloride 101  98 - 111 mmol/L   CO2 20 (L) 22 - 32 mmol/L   Glucose, Bld 113 (H) 70 - 99 mg/dL    Comment: Glucose reference range applies only to samples taken after fasting for at least 8 hours.   BUN 81 (H) 8 - 23 mg/dL   Creatinine, Ser 5.67 (H) 0.61 - 1.24 mg/dL   Calcium  9.6 8.9 - 10.3 mg/dL   GFR, Estimated 14 (L) >60 mL/min    Comment: (NOTE) Calculated using the CKD-EPI Creatinine Equation (2021)    Anion gap 10 5 - 15    Comment: Performed at [facility], [address], [place], [place] [address]  Magnesium      Status: None   Collection Time: 08/26/24  3:45 AM  Result Value Ref Range   Magnesium  1.8 1.7 - 2.4 mg/dL    Comment: Performed at [facility], [address], [place], [place] [address]  Cooxemetry Panel (carboxy, met, total hgb, O2 sat)     Status: Abnormal   Collection Time: 08/26/24  3:45 AM  Result Value Ref Range   Total hemoglobin 8.9 (L) 12.0 - 16.0 g/dL   O2 Saturation 37.4 %   Carboxyhemoglobin 2.2 (H) 0.5 - 1.5 %   Methemoglobin 0.8 0.0 - 1.5 %    Comment: Performed at [facility], [address] [place], [place] [address]  Creatinine, serum     Status: Abnormal   Collection Time: 08/26/24  1:32 PM  Result Value Ref Range   Creatinine, Ser 5.01 (H) 0.61 - 1.24 mg/dL   GFR, Estimated 11 (L) >60 mL/min    Comment: (NOTE) Calculated using the CKD-EPI Creatinine Equation (2021) Performed at [facility], [address] [place], [place] [address]   Lactic acid, plasma     Status: None   Collection Time: 08/26/24  1:32 PM  Result Value Ref Range   Lactic Acid, Venous 1.6 0.5 - 1.9 mmol/L    Comment: Performed at [facility], [address] [place], [place] [address]  Urinalysis, Routine w reflex microscopic -Urine, Clean Catch     Status: Abnormal   Collection Time: 08/27/24  4:24 AM  Result Value Ref Range   Color, Urine YELLOW YELLOW   APPearance HAZY (A) CLEAR   Specific Gravity, Urine 1.016 1.005 - 1.030   pH 5.0 5.0 -  8.0   Glucose, UA NEGATIVE NEGATIVE mg/dL   Hgb urine dipstick NEGATIVE NEGATIVE   Bilirubin Urine NEGATIVE NEGATIVE   Ketones, ur NEGATIVE NEGATIVE mg/dL   Protein, ur NEGATIVE NEGATIVE mg/dL   Nitrite NEGATIVE NEGATIVE   Leukocytes,Ua MODERATE (A) NEGATIVE   RBC / HPF 6-10 0 - 5 RBC/hpf   WBC, UA 21-50 0 - 5 WBC/hpf   Bacteria, UA RARE (A) NONE SEEN   Squamous Epithelial / HPF 0-5 0 - 5 /HPF   Mucus PRESENT    Hyaline Casts, UA PRESENT     Comment: Performed at [facility], [address] [place], [place] [address]  Basic metabolic panel     Status: Abnormal   Collection Time: 08/27/24  4:31 AM  Result Value Ref Range   Sodium 131 (L) 135 - 145 mmol/L   Potassium 5.7 (H) 3.5 - 5.1 mmol/L   Chloride 102 98 - 111 mmol/L   CO2 21 (L) 22 -  32 mmol/L   Glucose, Bld 104 (H) 70 - 99 mg/dL    Comment: Glucose reference range applies only to samples taken after fasting for at least 8 hours.   BUN 84 (H) 8 - 23 mg/dL   Creatinine, Ser 5.10 (H) 0.61 - 1.24 mg/dL   Calcium  9.7 8.9 - 10.3 mg/dL   GFR, Estimated 12 (L) >60 mL/min    Comment: (NOTE) Calculated using the CKD-EPI Creatinine Equation (2021)    Anion gap 8 5 - 15    Comment: Performed at [facility], [address], [place], [place] [address]  Magnesium      Status: None   Collection Time: 08/27/24  4:31 AM  Result Value Ref Range   Magnesium  1.7 1.7 - 2.4 mg/dL    Comment: Performed at [facility], [address], [place], [place] [address]  CBC     Status: Abnormal   Collection Time: 08/27/24  4:31 AM  Result Value Ref Range   WBC 8.8 4.0 - 10.5 K/uL   RBC 2.55 (L) 4.22 - 5.81 MIL/uL   Hemoglobin 7.4 (L) 13.0 - 17.0 g/dL   HCT 76.4 (L) 60.9 - 47.9 %   MCV 92.2 80.0 - 100.0 fL   MCH 29.0 26.0 - 34.0 pg   MCHC 31.5 30.0 - 36.0 g/dL   RDW 85.2 88.4 - 84.4 %   Platelets 184 150 - 400 K/uL   nRBC 0.0 0.0 - 0.2 %    Comment: Performed at [facility], [address] [place], [place] [address]     ECG   Paced rhythm - Personally Reviewed  Telemetry   Paced rhythm - Personally Reviewed  Radiology    DG CHEST PORT 1 VIEW Result Date: 08/26/2024 EXAM: 1 VIEW(S) XRAY OF THE CHEST 08/26/2024 05:41:54 AM COMPARISON: 08/24/2024 CLINICAL HISTORY: Hemothorax FINDINGS: LINES, TUBES AND DEVICES: Right PICC line terminates at superior cavoatrial junction. Stable left single lead pacemaker in place. LUNGS AND PLEURA: Similar right pleural effusion and right basilar opacity. No pneumothorax. HEART AND MEDIASTINUM: Stable cardiomegaly. Aortic atherosclerosis. BONES AND SOFT TISSUES: No acute osseous abnormality. IMPRESSION: 1. Similar right pleural effusion and right basilar opacity, unchanged from prior study. Electronically signed by: Waddell Calk MD 08/26/2024 05:49 AM EST RP Workstation: [id number]   US  EKG SITE RITE Result Date: 08/25/2024 If Site Rite image not attached, placement could not be confirmed due to current cardiac rhythm.   Cardiac Studies   N/A  Assessment   Principal Problem:   Multiple traumatic injuries Active Problems:   Cardiorenal syndrome with renal failure Low output biventricular heart failure  Plan   Mr. Rueb had progressive renal failure- CVP initially measured high, but that was erroneous - actually intravascularly dry. Milrinone  stopped d/t hypotension, given fluid with improvement in creatinine. Will give additional IVF today - 500 cc at 75 cc /hr and repeat albumin . Hold on diuretics and inotropes for now. ACE wrap legs. Awaiting CO-OX. Potassium 5.7 today- nephrology on board, may need Lokelma .   CRITICAL CARE TIME: I have spent a total of 35 minutes with patient reviewing hospital notes, telemetry, EKGs, labs and examining the patient as well as establishing an assessment and plan that was discussed with the patient.  > 50% of time was spent in direct patient care. The patient is critically ill with multi-organ system failure and requires high  complexity decision making for assessment and support, frequent evaluation and titration of therapies, application of advanced monitoring  technologies and extensive interpretation of multiple databases.    Length of Stay:  LOS: 5 days   Vinie KYM Maxcy, MD, [facility], FNLA, FACP  [place]  [facility]  Medical Director of the [facility]  [facility] Diplomate of the American Board of Clinical Lipidology Attending Cardiologist  Direct Dial: [phone]  Fax: [phone]  Website:  [email or web address] Vinie BROCKS Edmundo Tedesco 08/27/2024, 8:24 AM

## 2024-08-28 ENCOUNTER — Inpatient Hospital Stay (HOSPITAL_COMMUNITY)

## 2024-08-28 DIAGNOSIS — I1310 Hypertensive heart and chronic kidney disease without heart failure, with stage 1 through stage 4 chronic kidney disease, or unspecified chronic kidney disease: Secondary | ICD-10-CM

## 2024-08-28 DIAGNOSIS — I5082 Biventricular heart failure: Secondary | ICD-10-CM | POA: Diagnosis not present

## 2024-08-28 DIAGNOSIS — I132 Hypertensive heart and chronic kidney disease with heart failure and with stage 5 chronic kidney disease, or end stage renal disease: Secondary | ICD-10-CM | POA: Diagnosis not present

## 2024-08-28 DIAGNOSIS — T07XXXA Unspecified multiple injuries, initial encounter: Secondary | ICD-10-CM | POA: Diagnosis not present

## 2024-08-28 HISTORY — PX: IR TUNNELED CENTRAL VENOUS CATH PLC W IMG: IMG1939

## 2024-08-28 LAB — BASIC METABOLIC PANEL WITH GFR
Anion gap: 9 (ref 5–15)
BUN: 91 mg/dL — ABNORMAL HIGH (ref 8–23)
CO2: 21 mmol/L — ABNORMAL LOW (ref 22–32)
Calcium: 9.5 mg/dL (ref 8.9–10.3)
Chloride: 100 mmol/L (ref 98–111)
Creatinine, Ser: 5.07 mg/dL — ABNORMAL HIGH (ref 0.61–1.24)
GFR, Estimated: 11 mL/min — ABNORMAL LOW (ref >60)
Glucose, Bld: 94 mg/dL (ref 70–99)
Potassium: 5.5 mmol/L — ABNORMAL HIGH (ref 3.5–5.1)
Sodium: 130 mmol/L — ABNORMAL LOW (ref 135–145)

## 2024-08-28 LAB — CBC
HCT: 21.7 % — ABNORMAL LOW (ref 39.0–52.0)
Hemoglobin: 7.1 g/dL — ABNORMAL LOW (ref 13.0–17.0)
MCH: 30.3 pg (ref 26.0–34.0)
MCHC: 32.7 g/dL (ref 30.0–36.0)
MCV: 92.7 fL (ref 80.0–100.0)
Platelets: 176 K/uL (ref 150–400)
RBC: 2.34 MIL/uL — ABNORMAL LOW (ref 4.22–5.81)
RDW: 14.7 % (ref 11.5–15.5)
WBC: 5.9 K/uL (ref 4.0–10.5)
nRBC: 0 % (ref 0.0–0.2)

## 2024-08-28 LAB — IRON AND TIBC
Iron: 22 ug/dL — ABNORMAL LOW (ref 45–182)
Saturation Ratios: 7 % — ABNORMAL LOW (ref 17.9–39.5)
TIBC: 330 ug/dL (ref 250–450)
UIBC: 308 ug/dL

## 2024-08-28 LAB — COOXEMETRY PANEL
Carboxyhemoglobin: 2.3 % — ABNORMAL HIGH (ref 0.5–1.5)
Methemoglobin: <0.7 % (ref 0.0–1.5)
O2 Saturation: 96.1 %
Total hemoglobin: 7.5 g/dL — ABNORMAL LOW (ref 12.0–16.0)

## 2024-08-28 LAB — MAGNESIUM: Magnesium: 1.9 mg/dL (ref 1.7–2.4)

## 2024-08-28 LAB — FERRITIN: Ferritin: 95 ng/mL (ref 24–336)

## 2024-08-28 LAB — HEPATITIS B SURFACE ANTIGEN: Hepatitis B Surface Ag: NONREACTIVE

## 2024-08-28 MED ORDER — CEFAZOLIN SODIUM-DEXTROSE 2-4 GM/100ML-% IV SOLN
INTRAVENOUS | Status: AC
  Filled 2024-08-28: qty 100

## 2024-08-28 MED ORDER — CHLORHEXIDINE GLUCONATE CLOTH 2 % EX PADS
6.0000 | MEDICATED_PAD | Freq: Every day | CUTANEOUS | Status: DC
  Administered 2024-08-28 – 2024-09-11 (×11): 6 via TOPICAL

## 2024-08-28 MED ORDER — CHLORHEXIDINE GLUCONATE CLOTH 2 % EX PADS
6.0000 | MEDICATED_PAD | Freq: Every day | CUTANEOUS | Status: DC
  Administered 2024-08-30 – 2024-09-11 (×12): 6 via TOPICAL

## 2024-08-28 MED ORDER — CEFAZOLIN SODIUM-DEXTROSE 2-4 GM/100ML-% IV SOLN
2.0000 g | INTRAVENOUS | Status: AC
  Administered 2024-08-28: 2 g via INTRAVENOUS

## 2024-08-28 MED ORDER — ALBUMIN HUMAN 25 % IV SOLN
25.0000 g | Freq: Once | INTRAVENOUS | Status: AC
  Administered 2024-08-28: 25 g via INTRAVENOUS
  Filled 2024-08-28: qty 100

## 2024-08-28 MED ORDER — FUROSEMIDE 10 MG/ML IJ SOLN
80.0000 mg | Freq: Once | INTRAMUSCULAR | Status: AC
  Administered 2024-08-28: 80 mg via INTRAVENOUS
  Filled 2024-08-28: qty 8

## 2024-08-28 MED ORDER — MIDAZOLAM HCL 2 MG/2ML IJ SOLN
INTRAMUSCULAR | Status: AC
  Filled 2024-08-28: qty 2

## 2024-08-28 MED ORDER — HEPARIN SODIUM (PORCINE) 1000 UNIT/ML IJ SOLN
INTRAMUSCULAR | Status: AC
  Filled 2024-08-28: qty 10

## 2024-08-28 MED ORDER — LIDOCAINE-EPINEPHRINE 1 %-1:100000 IJ SOLN
INTRAMUSCULAR | Status: AC
  Filled 2024-08-28: qty 1

## 2024-08-28 MED ORDER — SODIUM ZIRCONIUM CYCLOSILICATE 10 G PO PACK
10.0000 g | PACK | Freq: Once | ORAL | Status: DC
  Filled 2024-08-28: qty 1

## 2024-08-28 MED ORDER — HEPARIN SODIUM (PORCINE) 1000 UNIT/ML IJ SOLN
10000.0000 [IU] | Freq: Once | INTRAMUSCULAR | Status: DC
  Filled 2024-08-28: qty 10

## 2024-08-28 MED ORDER — LIDOCAINE-EPINEPHRINE 1 %-1:100000 IJ SOLN
20.0000 mL | Freq: Once | INTRAMUSCULAR | Status: DC
  Filled 2024-08-28: qty 20

## 2024-08-28 MED ORDER — MIDAZOLAM HCL (PF) 2 MG/2ML IJ SOLN
INTRAMUSCULAR | Status: AC | PRN
  Administered 2024-08-28 (×2): .5 mg via INTRAVENOUS

## 2024-08-28 NOTE — Consult Note (Signed)
 Patient Status: [facility] - In-pt  Assessment and Plan: Patient in need of tunneled central venous access for hemodialysis. He does not currently have acces for HD. Has been hypotensive; he is amenable to single agent sedation if this is needed.  WBC reviewed and within acceptable limits. Afebrile. Spoke with Dr. Jerrye with nephrology as previous notes in chart suggest that patient would not tolerate HD/would not want tx beyond temporary. Updated clinical picture is that nephrology and cardiology agree that patient will tolerate and that  long-term access is appropriate over temporary cath.   Patient is NPO since MN. IR will tentatively complete this today.   Patient is also on IR list for thora. Consented for both procedures, but priority to be TDC.  Risks and benefits discussed with the patient including, but not limited to bleeding, infection, vascular injury, pneumothorax which may require chest tube placement, air embolism or even death  All of the patient's questions were answered, patient is agreeable to proceed. Consent signed and in chart.    ______________________________________________________________________   History of Present Illness: Dean [name]. is a 73 y.o. male with PMH significant for HTN, HLD, CAD, cardiac arrest (VT/VF 2017), ICM s/p AICD placement, HF, afib (eliquis ), DM, OSA (does not use CPAP), recent fall from [facility]. He was recently admitted in Oct 2025 for HF exacerbation, currently admitted after recent fall with care complicated by AKI on chronic disease. His care has also required milrinone  gtt which patient is no longer requiring. R PICC in place.   Nephrology consulted during admission; HD will be started today or tomorrow. IR consulted for HD tunneled cath.  Wearing O2 [place] (2L), would not normally require this at home.    Allergies and medications reviewed.    Review of Systems  Constitutional:  Negative for chills and fever.  HENT:  Negative  for sore throat.   Respiratory:  Negative for shortness of breath.   Cardiovascular:  Negative for chest pain.  Gastrointestinal:  Negative for blood in stool, nausea and vomiting.  Genitourinary:  Positive for decreased urine volume. Negative for hematuria.  Skin:  Positive for wound.    Vital Signs: BP (!) 121/37 (BP Location: Left Arm)   Pulse 74   Temp 97.6 F (36.4 C) (Oral)   Resp 17   Ht 5' 6 (1.676 m)   Wt 213 lb 13.5 oz (97 kg)   SpO2 97%   BMI 34.52 kg/m   Physical Exam HENT:     Mouth/Throat:     Mouth: Mucous membranes are moist.     Pharynx: Oropharynx is clear.  Cardiovascular:     Rate and Rhythm: Normal rate.     Pulses: Normal pulses.  Pulmonary:     Effort: Pulmonary effort is normal.     Comments: diminished Abdominal:     Palpations: Abdomen is soft.  Musculoskeletal:     Right lower leg: Edema present.     Left lower leg: Edema present.  Skin:    General: Skin is warm and dry.     Comments: R PICC dressing C/D/I, no concerns with skin/insertion site visible through dressing window.   Neurological:     Mental Status: He is alert and oriented to person, place, and time.  Psychiatric:        Mood and Affect: Mood normal.        Behavior: Behavior normal.        Thought Content: Thought content normal.  Judgment: Judgment normal.      Imaging reviewed.   Labs:  COAGS: Recent Labs    11/17/23 1720 05/07/24 0554 08/22/24 0418  INR 1.8*  --  1.6*  APTT 41* 97*  --     BMP: Recent Labs    08/25/24 0926 08/26/24 0345 08/26/24 1332 08/27/24 0431 08/28/24 0617  NA 133* 131*  --  131* 130*  K 5.2* 5.1  --  5.7* 5.5*  CL 101 101  --  102 100  CO2 24 20*  --  21* 21*  GLUCOSE 85 113*  --  104* 94  BUN 70* 81*  --  84* 91*  CALCIUM  9.6 9.6  --  9.7 9.5  CREATININE 3.78* 4.32* 5.01* 4.89* 5.07*  GFRNONAA 16* 14* 11* 12* 11*       Electronically Signed: Laymon Coast, NP 08/28/2024, 12:17 PM   I spent a total of  15 minutes in face to face in clinical consultation, greater than 50% of which was counseling/coordinating care for venous access.

## 2024-08-28 NOTE — Progress Notes (Signed)
   08/28/24 2158  Vitals  Temp (!) 97.5 F (36.4 C)  Temp Source Oral  BP (!) 108/42  MAP (mmHg) (!) 59  Pulse Rate 86  ECG Heart Rate 86  Resp (!) 26  Oxygen Therapy  SpO2 (!) 89 %  During Treatment Monitoring  Blood Flow Rate (mL/min) 200 mL/min  Arterial Pressure (mmHg) -88.88 mmHg  Venous Pressure (mmHg) 76.56 mmHg  TMP (mmHg) 28.68 mmHg  Ultrafiltration Rate (mL/min) 468 mL/min  Dialysate Flow Rate (mL/min) 300 ml/min  Dialysate Potassium Concentration 2  Dialysate Calcium  Concentration 2.5  Duration of HD Treatment -hour(s) 1.98 hour(s)  Cumulative Fluid Removed (mL) per Treatment  490.44  HD Safety Checks Performed Yes  Intra-Hemodialysis Comments Tx completed  Post Treatment  Dialyzer Clearance Lightly streaked  Liters Processed 24  Fluid Removed (mL) 500 mL  Tolerated HD Treatment Yes  Hemodialysis Catheter Right Internal jugular Double lumen Permanent (Tunneled)  Placement Date/Time: 08/28/24 1526   Placed prior to admission: No  Serial / Lot #: [id number]  Expiration Date: 09/27/28  Time Out: Correct patient;Correct site;Correct procedure  Maximum sterile barrier precautions: Hand hygiene;Large sterile sheet;...  Site Condition No complications  Blue Lumen Status Flushed  Red Lumen Status Flushed  Catheter fill volume (Arterial) 1.6 cc  Catheter fill volume (Venous) 1.6  Dressing Type Gauze/Drain sponge  Drainage Description None  Dressing Change Due 09/04/24  Post treatment catheter status Capped and Clamped

## 2024-08-28 NOTE — Progress Notes (Signed)
 Patient is back from IR with a new RIGHT internal jugular, HD catheter.

## 2024-08-28 NOTE — Progress Notes (Signed)
 Subjective: CC: Patient with 2 daughters at bedside.  Reports sob. On 3L. Up to the chair today. No cough.   Afebrile. Soft BP yesterday that are improved today. NA 130. K 5.5. Cr 5.07 (4.89). Hgb 7.1 (7.4). Nephrology planning for HD after unneled HD catheter placement by IR.   Objective: Vital signs in last 24 hours: Temp:  [97.5 F (36.4 C)-98 F (36.7 C)] 97.6 F (36.4 C) (12/04 0800) Pulse Rate:  [71-81] 74 (12/04 0800) Resp:  [13-25] 17 (12/04 0800) BP: (82-121)/(28-79) 121/37 (12/04 0800) SpO2:  [85 %-98 %] 97 % (12/04 0800) Last BM Date : 08/26/24  Intake/Output from previous day: 12/03 0701 - 12/04 0700 In: 10 [I.V.:10] Out: 250 [Urine:250] Intake/Output this shift: Total I/O In: 20 [I.V.:20] Out: -   PE: Gen:  Alert, NAD, pleasant, sitting up in chair w/ TLSO brace on  Card:  Reg rate Pulm:  Tachypnea noted. Decreased breath sounds on right slightly compared to left but lung sounds heard in all fields b/l. On 3L Ext:  Noted bilateral lower extremity edema  Lab Results:  Recent Labs    08/27/24 0431 08/28/24 0935  WBC 8.8 5.9  HGB 7.4* 7.1*  HCT 23.5* 21.7*  PLT 184 176   BMET Recent Labs    08/27/24 0431 08/28/24 0617  NA 131* 130*  K 5.7* 5.5*  CL 102 100  CO2 21* 21*  GLUCOSE 104* 94  BUN 84* 91*  CREATININE 4.89* 5.07*  CALCIUM  9.7 9.5   PT/INR No results for input(s): LABPROT, INR in the last 72 hours. CMP     Component Value Date/Time   NA 130 (L) 08/28/2024 0617   NA 140 06/04/2024 1445   K 5.5 (H) 08/28/2024 0617   CL 100 08/28/2024 0617   CO2 21 (L) 08/28/2024 0617   GLUCOSE 94 08/28/2024 0617   BUN 91 (H) 08/28/2024 0617   BUN 23 06/04/2024 1445   CREATININE 5.07 (H) 08/28/2024 0617   CALCIUM  9.5 08/28/2024 0617   PROT 6.7 08/25/2024 1351   PROT 6.6 05/16/2024 1308   ALBUMIN  3.1 (L) 08/25/2024 1351   ALBUMIN  3.5 (L) 05/16/2024 1308   AST 21 08/25/2024 1351   ALT 15 08/25/2024 1351   ALKPHOS 71 08/25/2024  1351   BILITOT 1.4 (H) 08/25/2024 1351   BILITOT 2.0 (H) 05/16/2024 1308   GFRNONAA 11 (L) 08/28/2024 0617   GFRAA 59 (L) 11/02/2020 1457   Lipase     Component Value Date/Time   LIPASE 28 04/16/2024 2252    Studies/Results: DG CHEST PORT 1 VIEW Result Date: 08/28/2024 CLINICAL DATA:  Pleural effusion EXAM: PORTABLE CHEST 1 VIEW COMPARISON:  Chest radiograph dated 08/27/2024 FINDINGS: Lines/tubes: Left chest wall ICD lead projects over the right ventricle. Right upper extremity PICC tip projects over the SVC. Lungs: Low lung volumes with bronchovascular crowding. No focal consolidations. Pleura: Unchanged large right pleural effusion.  No pneumothorax. Heart/mediastinum: Right heart border is obscured. Bones: No acute osseous abnormality. IMPRESSION: 1. Unchanged large right pleural effusion. 2. Low lung volumes with bronchovascular crowding. Electronically Signed   By: Limin  Xu M.D.   On: 08/28/2024 07:26   DG CHEST PORT 1 VIEW Result Date: 08/27/2024 CLINICAL DATA:  Shortness of breath.  History of asthma. EXAM: PORTABLE CHEST 1 VIEW COMPARISON:  08/26/2024 FINDINGS: Right PICC tip in the superior vena cava proximally 1 cm superior to the superior cavoatrial junction. Stable left subclavian AICD lead and generator. Stable enlarged  cardiac silhouette. Large right pleural effusion, increased. No significant change in associated right basilar atelectasis. Clear left lung. Tortuous and partially calcified thoracic aorta. No acute bony abnormality. IMPRESSION: 1. Large right pleural effusion, increased. 2. Stable cardiomegaly. 3. Right PICC tip in the superior vena cava, proximally 1 cm superior to the superior cavoatrial junction. Electronically Signed   By: Elspeth Bathe M.D.   On: 08/27/2024 14:47    Anti-infectives: Anti-infectives (From admission, onward)    None        Assessment/Plan Fall from wheelchair T6 vb fx - Per NSGY, TLSO brace, outpatient follow up in 2 weeks. Moderate  right pleural effusion - Did have small bilateral pleural effusions on CT in mid October.  - S/p thoracentesis 11/28 with bloody output. Cx NGTD - HF team following. Currently holding off on diuretics with AKI - CXR yesterday with worsening pleural effusion. Unchanged large right pleural effusion today. IR consult pending for repeat thoracentesis.  - Cont aggressive pulmonary toilet, mobilize as able  FEN: NPO for IR eval VTE: SCD's, SQH, hold DOAC for now Foley: none ID: none Dispo: Progressive care   - Per primary -  Chronic heart failure (ischemic cardiomyopathy) - HF following Hx VT/VF arrest and AICD Hx hypertension Hx OSA on CPAP Hx A-fib on Eliquis  Hx type 2 diabetes  AKI - Per Nephrology. They are planning for HD after unneled HD catheter placement by IR.  Anemia - Hgb 7.1. Could be multifactorial. Does have hemothorax. Monitor  Multilevel foraminal/spinal stenoses/ severe degenerative disease  dense liver suggesting metallic or amiodarone  toxicity Caridomegaly and aortic/acoronary atherosclerosis Sigmoid diverticulosis  I reviewed nursing notes, Consultant (HF) notes, hospitalist notes, last 24 h vitals and pain scores, last 48 h intake and output, last 24 h labs and trends, and last 24 h imaging results.    LOS: 6 days    Ozell CHRISTELLA Shaper, [facility] 08/28/2024, 11:24 AM Please see Amion for pager number during day hours 7:00am-4:30pm

## 2024-08-28 NOTE — Procedures (Signed)
 Vascular and Interventional Radiology Procedure Note  Patient: Dean Mittag Sr. DOB: Dec 10, 1950 Medical Record Number: [id number] Note Date/Time: 08/28/24 3:24 PM   Performing Physician: Thom Hall, MD Assistant(s): None  Diagnosis: ESRD requiring Hemodialysis  Procedure: TUNNELED HEMODIALYSIS CATHETER PLACEMENT  Anesthesia: Conscious Sedation Complications: None Estimated Blood Loss: Minimal Specimens:  None  Findings:  Successful placement of right-sided, 19 cm (tip-to-cuff), tunneled hemodialysis catheter with the tip of the catheter in the proximal right atrium.  Plan: Catheter ready for use.  See detailed procedure note with images in PACS. The patient tolerated the procedure well without incident or complication and was returned to Recovery in stable condition.    Thom Hall, MD [facility]   Pager. [phone] Clinic. [phone]

## 2024-08-28 NOTE — Progress Notes (Signed)
 PROGRESS NOTE    Dean Croft Sr.  FMW:[id number] DOB: September 08, 1951 DOA: 08/22/2024 PCP: [name], MD  Subjective: Overnight events noted. Patient became hypotensive in the 70s, but improved now. He reports some shortness of breath, and ongoing back pain from his fall.     Hospital Course: 74M h/o CAD, PAF, ICM, cardiac arrest s/p AICD, HFrEF (EF 35-40% in 04/2024), HTN, HLD, DM2, OSA, and last admission in 06/2204 for HFrEF exacerbation who p/w back pain s/p GLF from wheelchair and found to have AKI and acute T6 fracture. Recent OP Cards tele appointment note on 11/20 suggested multiple missed appointments and recent increase in edema and uptitration of torsemide  to 60mg  daily. Hospital stay complicated with HF With worsening renal function. Cardiology and renal following    Assessment and Plan:  Acute T6 fracture, physical deconditioning  - neurosurgery consulted; apprec eval/recs (no surgery indicated; TLSO and OP f/u w/ Dr. [name]) -TLSO brace for now -PT/OT consulted- home health   R pleural effusion RML/RLL consolidation - s/p IR guided thoracentesis on 11/28 with removal of 500 mL of bloody fluid  - repeat chest x ray showed increased right sided pleural effusion. IR plan on repeat thoracentesis  - on 3L [place], continue to monitor respiratory status    Cardiorenal syndrome with renal failure/Low output biventricular heart failure - milrinone  was initially started but held due to hypotension. 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 Patient and family consented    Hyperkalemia - due to AKI on CKD - getting lokelma  today  - low K diet    Liver  enhancement on imaging -OP f/u with Cards/GI to discuss amiodarone  toxicity   PAF -PTA amiodarone  200mg  daily and Eliquis  5mg  BID (currently held)    DVT prophylaxis: heparin  injection 5,000 Units Start: 08/27/24 1615    Code Status: Limited: Do not attempt resuscitation (DNR) -DNR-LIMITED -Do Not Intubate/DNI  Family Communication: Daughters updated at bedside  Disposition Plan: TBD Reason for continuing need for hospitalization: Not med ready   Objective: Vitals:   08/28/24 0800 08/28/24 1235 08/28/24 1244 08/28/24 1254  BP: (!) 121/37 (!) 115/34 (!) 116/40 (!) 110/38  Pulse: 74 74 74 74  Resp: 17 17 17 16   Temp: 97.6 F (36.4 C) 97.9 F (36.6 C)    TempSrc: Oral Oral    SpO2: 97% 95% 94% 96%  Weight:      Height:        Intake/Output Summary (Last 24 hours) at 08/28/2024 1521 Last data filed at 08/28/2024 1120 Gross per 24 hour  Intake 20 ml  Output 250 ml  Net -230 ml   Filed Weights   08/22/24 0259 08/25/24 0500 08/27/24 0728  Weight: 83.9 kg 91.9 kg 97 kg    Examination:  Physical Exam  Data Reviewed: I have personally reviewed following labs and imaging studies  CBC: Recent Labs  Lab 08/22/24 0418 08/22/24 0903 08/24/24 0318 08/25/24 0926 08/26/24 0345 08/27/24 0431 08/28/24 0935  WBC 6.8   < > 6.2 6.7 7.7 8.8 5.9  NEUTROABS 5.0  --   --   --   --   --   --  HGB 9.0*   < > 8.2* 8.8* 8.4* 7.4* 7.1*  HCT 28.1*   < > 25.5* 28.4* 26.2* 23.5* 21.7*  MCV 92.1   < > 92.1 93.7 91.9 92.2 92.7  PLT 142*   < > 134* 190 200 184 176   < > = values in this interval not displayed.   Basic Metabolic Panel: Recent Labs  Lab 08/24/24 0318 08/25/24 0926 08/26/24 0345 08/26/24 1332 08/27/24 0431 08/28/24 0617  NA 133* 133* 131*  --  131* 130*  K 4.6 5.2* 5.1  --  5.7* 5.5*  CL 103 101 101  --  102 100  CO2 19* 24 20*  --  21* 21*  GLUCOSE 104* 85 113*  --  104* 94  BUN 57* 70* 81*  --  84* 91*  CREATININE 3.77* 3.78* 4.32* 5.01* 4.89* 5.07*  CALCIUM   9.2 9.6 9.6  --  9.7 9.5  MG  --   --  1.8  --  1.7 1.9   GFR: Estimated Creatinine Clearance: 13.9 mL/min (A) (by C-G formula based on SCr of 5.07 mg/dL (H)). Liver Function Tests: Recent Labs  Lab 08/22/24 0418 08/25/24 1351  AST 22 21  ALT 15 15  ALKPHOS 53 71  BILITOT 1.5* 1.4*  PROT 6.3* 6.7  ALBUMIN  3.0* 3.1*   No results for input(s): LIPASE, AMYLASE in the last 168 hours. No results for input(s): AMMONIA in the last 168 hours. Coagulation Profile: Recent Labs  Lab 08/22/24 0418  INR 1.6*   Cardiac Enzymes: No results for input(s): CKTOTAL, CKMB, CKMBINDEX, TROPONINI in the last 168 hours. ProBNP, BNP (last 5 results) Recent Labs    05/04/24 0333 05/16/24 1308 06/25/24 1604 07/10/24 0227 08/22/24 0903  BNP 2,770.3* 618.2* 1,585.3* 1,952.1* 525.0*   HbA1C: No results for input(s): HGBA1C in the last 72 hours. CBG: No results for input(s): GLUCAP in the last 168 hours. Lipid Profile: No results for input(s): CHOL, HDL, LDLCALC, TRIG, CHOLHDL, LDLDIRECT in the last 72 hours. Thyroid Function Tests: No results for input(s): TSH, T4TOTAL, FREET4, T3FREE, THYROIDAB in the last 72 hours. Anemia Panel: Recent Labs    08/28/24 0935  FERRITIN 95  TIBC 330  IRON 22*   Sepsis Labs: Recent Labs  Lab 08/22/24 0903 08/25/24 1351 08/25/24 1607 08/26/24 1332 08/27/24 1855  PROCALCITON 0.44  --   --   --   --   LATICACIDVEN  --  1.8 1.5 1.6 0.8    Recent Results (from the past 240 hours)  Body fluid culture w Gram Stain     Status: None   Collection Time: 08/22/24 12:22 PM   Specimen: Lung, Right; Pleural Fluid  Result Value Ref Range Status   Specimen Description PLEURAL  Final   Special Requests RIGHT LUNG  Final   Gram Stain   Final    RARE WBC PRESENT, PREDOMINANTLY PMN NO ORGANISMS SEEN    Culture   Final    NO GROWTH 3 DAYS Performed at [facility], [address] [place], [place] [address]     Report Status 08/25/2024 FINAL  Final     Radiology Studies: DG CHEST PORT 1 VIEW Result Date: 08/28/2024 CLINICAL DATA:  Pleural effusion EXAM: PORTABLE CHEST 1 VIEW COMPARISON:  Chest radiograph dated 08/27/2024 FINDINGS: Lines/tubes: Left chest wall ICD lead projects over the right ventricle. Right upper extremity PICC tip projects over the SVC. Lungs: Low lung volumes with bronchovascular crowding. No focal consolidations. Pleura: Unchanged large right  pleural effusion.  No pneumothorax. Heart/mediastinum: Right heart border is obscured. Bones: No acute osseous abnormality. IMPRESSION: 1. Unchanged large right pleural effusion. 2. Low lung volumes with bronchovascular crowding. Electronically Signed   By: Limin  Xu M.D.   On: 08/28/2024 07:26   DG CHEST PORT 1 VIEW Result Date: 08/27/2024 CLINICAL DATA:  Shortness of breath.  History of asthma. EXAM: PORTABLE CHEST 1 VIEW COMPARISON:  08/26/2024 FINDINGS: Right PICC tip in the superior vena cava proximally 1 cm superior to the superior cavoatrial junction. Stable left subclavian AICD lead and generator. Stable enlarged cardiac silhouette. Large right pleural effusion, increased. No significant change in associated right basilar atelectasis. Clear left lung. Tortuous and partially calcified thoracic aorta. No acute bony abnormality. IMPRESSION: 1. Large right pleural effusion, increased. 2. Stable cardiomegaly. 3. Right PICC tip in the superior vena cava, proximally 1 cm superior to the superior cavoatrial junction. Electronically Signed   By: Elspeth Bathe M.D.   On: 08/27/2024 14:47    Scheduled Meds:  acetaminophen   650 mg Oral Q6H   amiodarone   200 mg Oral Daily   atorvastatin   40 mg Oral Daily   Chlorhexidine  Gluconate Cloth  6 each Topical Daily   Chlorhexidine  Gluconate Cloth  6 each Topical Q0600   collagenase   Topical Daily   heparin  injection (subcutaneous)  5,000 Units Subcutaneous Q8H   heparin  sodium (porcine)  10,000 Units  Intracatheter Once   hydrocerin   Topical BID   lidocaine -EPINEPHrine  20 mL Intradermal Once   polyethylene glycol  17 g Oral Daily   sodium chloride  flush  10-40 mL Intracatheter Q12H   sodium zirconium cyclosilicate   10 g Oral Once   Continuous Infusions:   ceFAZolin  (ANCEF ) IV     milrinone  Stopped (08/26/24 1316)     LOS: 6 days   Time spent: 45 minutes  Casimer Dare, MD  [facility]  08/28/2024, 3:21 PM

## 2024-08-28 NOTE — Progress Notes (Signed)
Patient is off unit to IR

## 2024-08-28 NOTE — Progress Notes (Signed)
 [facility] Progress Note  Name: Brighton Pilley Sr. MRN: [id number] DOB: September 07, 1951  Chief Complaint:  Fall with fracture  Subjective:  He had 250 mL UOP over 12/3. He had a bladder scan last night with 18 mL.  Spoke with patient and two daughters at bedside.  We discussed risks/benefits/indications for dialysis and I let him know that I recommended starting dialysis.  He does consent.  He is hopeful that he will be able to come off of dialysis.  I spoke with cardiology as well.  Hospitalist arrived to bedside during our discussion and is also in agreement.   Review of systems:    some shortness of breath  Discomfort after his fall with fracture Denies n/v Food doesn't taste normal  He has been NPO since midnight   --------------- Background on consult:  Zander Ingham Sr. is a 73 y.o. male with a history including CAD, CKD stage IV, paroxysmal afib, hx cardiac arrest s/p AICD, heart failure with reduced EF, and HTN who presented to the hospital after a fall down the stairs.  He was found to have an acute T6 fracture - neurosurgery was consulted and no surgery is indicated at this time.  He was also noted on admission to have a moderate sized right pleural effusion, suggestive of hemorrhagic or proteinaceous content.  Also with incidental finding of enlarged prostate and body wall edema.  He didn't have any contrasted studies noted.  Cardiology had seen the patient and initiated diuretics and milrinone  was added today.  Strict ins/outs are not available.  He had 250 mL UOP over 12/1 as well as three unmeasured urine voids.  He has had one unmeasured urine void today.  He has had some mild hypotension today.  CVP is low or negative per nursing report.  The milrinone  is now off.  Nephrology is consulted for assistance with management of AKI.  He was bladder scanned and had 0 ml.  He had a thoracentesis this admit with 500 mL bloody fluid.  He follows with Dr. [name] and baseline  Cr appears to be 2-3.  He has previously seen Dr. [name] in heart failure (most recently seen inpt by Dr. Rolan during a 06/2024 admission).  Per cardiology report, heart failure team is concerned that he may not tolerate dialysis.  He previously was on irbesartan  and torsemide  and has gotten intermittent IV lasix .  He states that when he last spoke with renal after his last admission that his creatinine was at baseline and he was recommended to follow-up as scheduled.  He states urine output has gone down ever since he started hydralazine -isosorbide  combination pill.  He would hope to avoid dialysis if possible.  If he needed it, he thinks he would want to dry but really hopes it doesn't come to that.    Intake/Output Summary (Last 24 hours) at 08/28/2024 0833 Last data filed at 08/28/2024 0335 Gross per 24 hour  Intake 10 ml  Output 250 ml  Net -240 ml    Vitals:  Vitals:   08/27/24 1939 08/27/24 2330 08/28/24 0335 08/28/24 0800  BP: (!) 119/37 (!) 121/41 119/79 (!) 121/37  Pulse: 77 71 81 74  Resp: (!) 22 19 (!) 23 17  Temp: 97.8 F (36.6 C) 97.9 F (36.6 C) (!) 97.5 F (36.4 C) 97.6 F (36.4 C)  TempSrc: Oral Oral Oral Oral  SpO2: 95% 98% 90% 97%  Weight:      Height:  Physical Exam:    General adult male in chair with some increased work of breathing HEENT normocephalic atraumatic extraocular movements intact sclera anicteric Neck supple trachea midline Lungs clear but reduced to auscultation bilaterally normal work of breathing at rest on nasal cannula Heart S1S2 no rub Abdomen soft nontender obese habitus Extremities bilateral 3+ edema with ulcer on right lower leg  Psych normal mood and affect Neuro alert and oriented x 3 provides hx and follows commands GU - no foley in place   Medications reviewed   Labs:     Latest Ref Rng & Units 08/28/2024    6:17 AM 08/27/2024    4:31 AM 08/26/2024    1:32 PM  BMP  Glucose 70 - 99 mg/dL 94  895    BUN 8 - 23 mg/dL  91  84    Creatinine 9.38 - 1.24 mg/dL 4.92  5.10  4.98   Sodium 135 - 145 mmol/L 130  131    Potassium 3.5 - 5.1 mmol/L 5.5  5.7    Chloride 98 - 111 mmol/L 100  102    CO2 22 - 32 mmol/L 21  21    Calcium  8.9 - 10.3 mg/dL 9.5  9.7       Assessment/Plan:    # AKI - Cardiorenal and may be intravascularly dry per CVP.  He is peripherally overloaded but with anemia and mild hypoalbuminemia.  Urine output seems to have dropped with hypotension  - Lasix  and albumin  once now   - strict ins/outs and daily weights.  Daily weights are not consistently charted.  I have asked nursing for help with this - Starting HD.  Plan for HD today and tomorrow and anticipate tx on Sat, 12/6, as well  - he has been NPO  - Consult IR for tunneled HD catheter placement    # Chronic heart failure with reduced Ejection fraction - note TTE 04/2024 with LVEF 35-40% and grade 2 diastolic dysfunction as well as moderately elevated pulmonary artery systolic pressure - milrinone  per cardiology discretion - currently off for hypotension and agree - elevate legs  - wrap legs below level of ulcer on right leg and up to knees    # Acute hypoxic respiratory failure  - on supplemental oxygen  - with overload - difficult to diurese - Lasix /albumin  today as above   # Hyperkalemia - he should have a renal diabetic diet when taking PO   - lokelma  once today and lasix /albumin  as above   # CKD stage IV - Follows with Dr. [name] - Baseline Cr 2-3 - for reference, no SGLT2 inhibitor due to hx of UTI's   # Anemia of CKD - Gave aranesp 40 mcg once on 12/2 to optimize anemia  - check iron stores    # Right-sided Pleural effusion  - s/p thoracentesis of 500 mL bloody fluid with IR   Disposition - continue inpatient monitoring     Katheryn JAYSON Saba, MD 08/28/2024 9:30 AM

## 2024-08-28 NOTE — Progress Notes (Incomplete)
   08/28/24 2330  Vitals  BP (!) 75/55  MAP (mmHg) (!) 63  BP Location Right Arm  BP Method Automatic  Patient Position (if appropriate) Sitting  Pulse Rate (!) 105  Pulse Rate Source Monitor  ECG Heart Rate (!) 106  Resp (!) 22  MEWS COLOR  MEWS Score Color Red  Oxygen Therapy  SpO2 97 %  O2 Device Nasal Cannula  Invasive Hemodynamic Monitoring  CVP (mmHg) (!) 47 mmHg  MEWS Score  MEWS Temp 0  MEWS Systolic 2  MEWS Pulse 1  MEWS RR 1  MEWS LOC 0  MEWS Score 4

## 2024-08-28 NOTE — Progress Notes (Signed)
 PT Cancellation Note  Patient Details Name: Dean Waltman Sr. MRN: [id number] DOB: May 13, 1951   Cancelled Treatment:    Reason Eval/Treat Not Completed: Patient at procedure or test/unavailable  Patient off the floor for procedure.    Dean [name], PT Acute Rehabilitation Services  Office [phone]  Dean [name] 08/28/2024, 3:11 PM

## 2024-08-28 NOTE — Progress Notes (Signed)
 DAILY PROGRESS NOTE   Patient Name: Dean Melander Sr. Date of Encounter: 08/28/2024 Cardiologist: Vinie JAYSON Maxcy, MD  Chief Complaint   No complaints  Patient Profile   Dean Milewski Sr. is a 73 y.o. 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 Juvenal.   Subjective   No complaints. Reports wraps have improved swelling some, appreciate WOC recommendations. Overnight, PCCM consulted - CO-OX 76. Worsening creatinine and oliguria. D/w Dr. Jerrye- suspect he will need dialysis soon.  Objective   Vitals:   08/27/24 1939 08/27/24 2330 08/28/24 0335 08/28/24 0800  BP: (!) 119/37 (!) 121/41 119/79 (!) 121/37  Pulse: 77 71 81 74  Resp: (!) 22 19 (!) 23 17  Temp: 97.8 F (36.6 C) 97.9 F (36.6 C) (!) 97.5 F (36.4 C) 97.6 F (36.4 C)  TempSrc: Oral Oral Oral Oral  SpO2: 95% 98% 90% 97%  Weight:      Height:        Intake/Output Summary (Last 24 hours) at 08/28/2024 0836 Last data filed at 08/28/2024 0335 Gross per 24 hour  Intake 10 ml  Output 250 ml  Net -240 ml   Filed Weights   08/22/24 0259 08/25/24 0500 08/27/24 0728  Weight: 83.9 kg 91.9 kg 97 kg    Physical Exam   General appearance: alert and no distress Neck: no carotid bruit, no JVD, and thyroid not enlarged, symmetric, no tenderness/mass/nodules Lungs: diminished breath sounds RLL Heart: regular rate and rhythm Abdomen: soft, non-tender; bowel sounds normal; no masses,  no organomegaly and in a back brace Extremities: edema 3-4+ bilateral pitting edema Neurologic: Grossly normal  Inpatient Medications    Scheduled Meds:  acetaminophen   650 mg Oral Q6H   amiodarone   200 mg Oral Daily   atorvastatin   40 mg Oral Daily   Chlorhexidine  Gluconate Cloth  6 each Topical Daily   collagenase   Topical  Daily   heparin  injection (subcutaneous)  5,000 Units Subcutaneous Q8H   hydrocerin   Topical BID   polyethylene glycol  17 g Oral Daily   sodium chloride  flush  10-40 mL Intracatheter Q12H    Continuous Infusions:  milrinone  Stopped (08/26/24 1316)    PRN Meds: HYDROmorphone  (DILAUDID ) injection, methocarbamol , oxyCODONE , sodium chloride  flush   Labs   Results for orders placed or performed during the hospital encounter of 08/22/24 (from the past 48 hours)  Creatinine, serum     Status: Abnormal   Collection Time: 08/26/24  1:32 PM  Result Value Ref Range   Creatinine, Ser 5.01 (H) 0.61 - 1.24 mg/dL   GFR, Estimated 11 (L) >60 mL/min    Comment: (NOTE) Calculated using the CKD-EPI Creatinine Equation (2021) Performed at [facility], [address] [place], [place] [address]   Lactic acid, plasma     Status: None   Collection Time: 08/26/24  1:32 PM  Result Value Ref Range   Lactic Acid, Venous 1.6 0.5 - 1.9 mmol/L    Comment: Performed at [facility], [address], [place], [place] [address]  Urinalysis, Routine w reflex microscopic -Urine, Clean Catch     Status: Abnormal   Collection Time: 08/27/24  4:24 AM  Result Value Ref Range   Color, Urine YELLOW YELLOW   APPearance HAZY (A) CLEAR  Specific Gravity, Urine 1.016 1.005 - 1.030   pH 5.0 5.0 - 8.0   Glucose, UA NEGATIVE NEGATIVE mg/dL   Hgb urine dipstick NEGATIVE NEGATIVE   Bilirubin Urine NEGATIVE NEGATIVE   Ketones, ur NEGATIVE NEGATIVE mg/dL   Protein, ur NEGATIVE NEGATIVE mg/dL   Nitrite NEGATIVE NEGATIVE   Leukocytes,Ua MODERATE (A) NEGATIVE   RBC / HPF 6-10 0 - 5 RBC/hpf   WBC, UA 21-50 0 - 5 WBC/hpf   Bacteria, UA RARE (A) NONE SEEN   Squamous Epithelial / HPF 0-5 0 - 5 /HPF   Mucus PRESENT    Hyaline Casts, UA PRESENT     Comment: Performed at [facility], [address] [place], [place] [address]  Basic metabolic panel     Status: Abnormal   Collection Time: 08/27/24  4:31  AM  Result Value Ref Range   Sodium 131 (L) 135 - 145 mmol/L   Potassium 5.7 (H) 3.5 - 5.1 mmol/L   Chloride 102 98 - 111 mmol/L   CO2 21 (L) 22 - 32 mmol/L   Glucose, Bld 104 (H) 70 - 99 mg/dL    Comment: Glucose reference range applies only to samples taken after fasting for at least 8 hours.   BUN 84 (H) 8 - 23 mg/dL   Creatinine, Ser 5.10 (H) 0.61 - 1.24 mg/dL   Calcium  9.7 8.9 - 10.3 mg/dL   GFR, Estimated 12 (L) >60 mL/min    Comment: (NOTE) Calculated using the CKD-EPI Creatinine Equation (2021)    Anion gap 8 5 - 15    Comment: Performed at [facility], [address] [place], [place] [address]  Magnesium      Status: None   Collection Time: 08/27/24  4:31 AM  Result Value Ref Range   Magnesium  1.7 1.7 - 2.4 mg/dL    Comment: Performed at [facility], [address], [place], [place] [address]  CBC     Status: Abnormal   Collection Time: 08/27/24  4:31 AM  Result Value Ref Range   WBC 8.8 4.0 - 10.5 K/uL   RBC 2.55 (L) 4.22 - 5.81 MIL/uL   Hemoglobin 7.4 (L) 13.0 - 17.0 g/dL   HCT 76.4 (L) 60.9 - 47.9 %   MCV 92.2 80.0 - 100.0 fL   MCH 29.0 26.0 - 34.0 pg   MCHC 31.5 30.0 - 36.0 g/dL   RDW 85.2 88.4 - 84.4 %   Platelets 184 150 - 400 K/uL   nRBC 0.0 0.0 - 0.2 %    Comment: Performed at [facility], [address], [place], [place] [address]  Lactic acid, plasma     Status: None   Collection Time: 08/27/24  6:55 PM  Result Value Ref Range   Lactic Acid, Venous 0.8 0.5 - 1.9 mmol/L    Comment: Performed at [facility], [address], [place], [place] [address]  Cooxemetry Panel (carboxy, met, total hgb, O2 sat)     Status: Abnormal   Collection Time: 08/27/24  7:30 PM  Result Value Ref Range   Total hemoglobin 8.1 (L) 12.0 - 16.0 g/dL   O2 Saturation 23.5 %   Carboxyhemoglobin 2.1 (H) 0.5 - 1.5 %   Methemoglobin <0.7 0.0 - 1.5 %    Comment: Performed at [facility], [address] [place], [place] [address]  Cooxemetry Panel  (carboxy, met, total hgb, O2 sat)     Status: Abnormal  Collection Time: 08/28/24  6:06 AM  Result Value Ref Range   Total hemoglobin 7.5 (L) 12.0 - 16.0 g/dL   O2 Saturation 03.8 %   Carboxyhemoglobin 2.3 (H) 0.5 - 1.5 %   Methemoglobin <0.7 0.0 - 1.5 %    Comment: Performed at [facility], [address], [place], [place] [address]  Basic metabolic panel     Status: Abnormal   Collection Time: 08/28/24  6:17 AM  Result Value Ref Range   Sodium 130 (L) 135 - 145 mmol/L   Potassium 5.5 (H) 3.5 - 5.1 mmol/L   Chloride 100 98 - 111 mmol/L   CO2 21 (L) 22 - 32 mmol/L   Glucose, Bld 94 70 - 99 mg/dL    Comment: Glucose reference range applies only to samples taken after fasting for at least 8 hours.   BUN 91 (H) 8 - 23 mg/dL   Creatinine, Ser 4.92 (H) 0.61 - 1.24 mg/dL   Calcium  9.5 8.9 - 10.3 mg/dL   GFR, Estimated 11 (L) >60 mL/min    Comment: (NOTE) Calculated using the CKD-EPI Creatinine Equation (2021)    Anion gap 9 5 - 15    Comment: Performed at [facility], [address], [place], [place] [address]  Magnesium      Status: None   Collection Time: 08/28/24  6:17 AM  Result Value Ref Range   Magnesium  1.9 1.7 - 2.4 mg/dL    Comment: Performed at [facility], [address], [place], [place] [address]    ECG   Paced rhythm - Personally Reviewed  Telemetry   Paced rhythm - Personally Reviewed  Radiology    DG CHEST PORT 1 VIEW Result Date: 08/28/2024 CLINICAL DATA:  Pleural effusion EXAM: PORTABLE CHEST 1 VIEW COMPARISON:  Chest radiograph dated 08/27/2024 FINDINGS: Lines/tubes: Left chest wall ICD lead projects over the right ventricle. Right upper extremity PICC tip projects over the SVC. Lungs: Low lung volumes with bronchovascular crowding. No focal consolidations. Pleura: Unchanged large right pleural effusion.  No pneumothorax. Heart/mediastinum: Right heart border is obscured. Bones: No acute osseous abnormality. IMPRESSION: 1. Unchanged large  right pleural effusion. 2. Low lung volumes with bronchovascular crowding. Electronically Signed   By: Limin  Xu M.D.   On: 08/28/2024 07:26   DG CHEST PORT 1 VIEW Result Date: 08/27/2024 CLINICAL DATA:  Shortness of breath.  History of asthma. EXAM: PORTABLE CHEST 1 VIEW COMPARISON:  08/26/2024 FINDINGS: Right PICC tip in the superior vena cava proximally 1 cm superior to the superior cavoatrial junction. Stable left subclavian AICD lead and generator. Stable enlarged cardiac silhouette. Large right pleural effusion, increased. No significant change in associated right basilar atelectasis. Clear left lung. Tortuous and partially calcified thoracic aorta. No acute bony abnormality. IMPRESSION: 1. Large right pleural effusion, increased. 2. Stable cardiomegaly. 3. Right PICC tip in the superior vena cava, proximally 1 cm superior to the superior cavoatrial junction. Electronically Signed   By: Elspeth Bathe M.D.   On: 08/27/2024 14:47    Cardiac Studies   N/A  Assessment   Principal Problem:   Multiple traumatic injuries Active Problems:   Cardiorenal syndrome with renal failure   Cardiorenal syndrome Low output biventricular heart failure  Plan   Mr. Beedle has had some worsening renal failure overnight- minimal response to fluids. Not on diuretics. CO-OX 76 yesterday, off milrinone  d/t hypotension. Given worsening renal function, may need short term dialysis - not an ideal long-term dialysis candidate.  Dr. Jerrye to d/w him today. BUN rising to 91 today.  CRITICAL CARE TIME: I have spent a total of 35 minutes with patient reviewing hospital notes, telemetry, EKGs, labs and examining the patient as well as establishing an assessment and plan that was discussed with the patient.  > 50% of time was spent in direct patient care. The patient is critically ill with multi-organ system failure and requires high complexity decision making for assessment and support, frequent evaluation and  titration of therapies, application of advanced monitoring technologies and extensive interpretation of multiple databases.  Length of Stay:  LOS: 6 days   Vinie KYM Maxcy, MD, [facility], FNLA, FACP  [place]  [facility]  Medical Director of the [facility]  [facility] Diplomate of the American Board of Clinical Lipidology Attending Cardiologist  Direct Dial: [phone]  Fax: [phone]  Website:  [email or web address] Vinie JAYSON Maxcy 08/28/2024, 8:36 AM

## 2024-08-29 ENCOUNTER — Inpatient Hospital Stay (HOSPITAL_COMMUNITY)

## 2024-08-29 DIAGNOSIS — I132 Hypertensive heart and chronic kidney disease with heart failure and with stage 5 chronic kidney disease, or end stage renal disease: Secondary | ICD-10-CM | POA: Diagnosis not present

## 2024-08-29 DIAGNOSIS — T07XXXA Unspecified multiple injuries, initial encounter: Secondary | ICD-10-CM | POA: Diagnosis not present

## 2024-08-29 DIAGNOSIS — I5082 Biventricular heart failure: Secondary | ICD-10-CM | POA: Diagnosis not present

## 2024-08-29 HISTORY — PX: IR THORACENTESIS ASP PLEURAL SPACE W/IMG GUIDE: IMG5380

## 2024-08-29 LAB — COOXEMETRY PANEL
Carboxyhemoglobin: 3.9 % — ABNORMAL HIGH (ref 0.5–1.5)
Methemoglobin: 2.2 % — ABNORMAL HIGH (ref 0.0–1.5)
O2 Saturation: 90.2 %
Total hemoglobin: 7.5 g/dL — ABNORMAL LOW (ref 12.0–16.0)

## 2024-08-29 LAB — BASIC METABOLIC PANEL WITH GFR
Anion gap: 11 (ref 5–15)
BUN: 65 mg/dL — ABNORMAL HIGH (ref 8–23)
CO2: 25 mmol/L (ref 22–32)
Calcium: 9.6 mg/dL (ref 8.9–10.3)
Chloride: 96 mmol/L — ABNORMAL LOW (ref 98–111)
Creatinine, Ser: 3.52 mg/dL — ABNORMAL HIGH (ref 0.61–1.24)
GFR, Estimated: 17 mL/min — ABNORMAL LOW (ref >60)
Glucose, Bld: 106 mg/dL — ABNORMAL HIGH (ref 70–99)
Potassium: 4.6 mmol/L (ref 3.5–5.1)
Sodium: 132 mmol/L — ABNORMAL LOW (ref 135–145)

## 2024-08-29 LAB — POCT I-STAT 7, (LYTES, BLD GAS, ICA,H+H)
Acid-base deficit: 3 mmol/L — ABNORMAL HIGH (ref 0.0–2.0)
Bicarbonate: 21.7 mmol/L (ref 20.0–28.0)
Calcium, Ion: 1.23 mmol/L (ref 1.15–1.40)
HCT: 22 % — ABNORMAL LOW (ref 39.0–52.0)
Hemoglobin: 7.5 g/dL — ABNORMAL LOW (ref 13.0–17.0)
O2 Saturation: 96 %
Potassium: 4.7 mmol/L (ref 3.5–5.1)
Sodium: 131 mmol/L — ABNORMAL LOW (ref 135–145)
TCO2: 23 mmol/L (ref 22–32)
pCO2 arterial: 34.4 mmHg (ref 32–48)
pH, Arterial: 7.407 (ref 7.35–7.45)
pO2, Arterial: 84 mmHg (ref 83–108)

## 2024-08-29 LAB — HEPATITIS B SURFACE ANTIBODY, QUANTITATIVE: Hep B S AB Quant (Post): <3.5 m[IU]/mL — ABNORMAL LOW

## 2024-08-29 LAB — MAGNESIUM: Magnesium: 1.9 mg/dL (ref 1.7–2.4)

## 2024-08-29 MED ORDER — CHLORHEXIDINE GLUCONATE CLOTH 2 % EX PADS: 6.0000 | MEDICATED_PAD | Freq: Every day | CUTANEOUS | Status: DC

## 2024-08-29 MED ORDER — LIDOCAINE-EPINEPHRINE 1 %-1:100000 IJ SOLN
20.0000 mL | Freq: Once | INTRAMUSCULAR | Status: DC
  Filled 2024-08-29: qty 20

## 2024-08-29 MED ORDER — IRON SUCROSE 200 MG IVPB - SIMPLE MED
200.0000 mg | Status: DC
  Administered 2024-08-29 (×2): 200 mg via INTRAVENOUS
  Filled 2024-08-29: qty 110
  Filled 2024-08-29: qty 200

## 2024-08-29 MED ORDER — LIDOCAINE-EPINEPHRINE 1 %-1:100000 IJ SOLN
INTRAMUSCULAR | Status: AC
  Filled 2024-08-29: qty 1

## 2024-08-29 MED ORDER — LIDOCAINE-EPINEPHRINE 1 %-1:100000 IJ SOLN
20.0000 mL | Freq: Once | INTRAMUSCULAR | Status: AC
  Administered 2024-08-29: 10 mL via INTRADERMAL

## 2024-08-29 NOTE — Evaluation (Addendum)
 Physical Therapy Progress Note   Patient Details Name: Dean Isidro Sr. MRN: [id number] DOB: [birth year] Today's Date: 08/29/2024   History of Present Illness  73 yo male s/p fall down 8 stairs in wheelchair. CT (+) T6 vertebral body fx, neurosurgery states no plan for surgery but needs TLSO brace. S/p R thoracentesis 11/28 yielding 500 mL bloody fluid. PMH AICD, arthritis, HLD, HFrEF, ICD, PAF on Eliquis , HTN, CAD, HLD, OSA on cpap, T2DM, CKD, HTN, laminectomy, cardiac arrest (2017).  Clinical Impression  Pt has progressed well today after up from a deep sleep.  Emphasis on transition to EOB with log roll, sitting balance while being cued and assisted to donn the brace, STS and gait stability/stamina in the halls with the RW and CG/min assist        PT Assessment    Assistance Needed at Discharge       Equipment Recommendations Rolling walker (2 wheels)  Recommendations for Other Services       Precautions/Restrictions Precautions Precautions: Fall;Back Spinal Brace: Thoracolumbosacral orthotic        Mobility  Bed Mobility Rolling: Min assist, Used rails        Transfers Overall transfer level: Needs assistance Equipment used: Rolling walker (2 wheels) Transfers: Sit to/from Stand Sit to Stand: Contact guard assist           General transfer comment: cues for improved safety and use of hands    Ambulation/Gait Ambulation/Gait assistance: Contact guard assist Gait Distance (Feet): 120 Feet (x2) Assistive device: Rolling walker (2 wheels) Gait Pattern/deviations: Step-through pattern, Decreased stride length, Trunk flexed   General Gait Details: still cuing for proximity to the RW and posture  Home Activity Instructions    Stairs            Modified Rankin (Stroke Patients Only)        Balance   Sitting-balance support: No upper extremity supported, Feet supported Sitting balance-Leahy Scale: Fair     Standing balance support:  Bilateral upper extremity supported Standing balance-Leahy Scale: Fair Standing balance comment: able to let go of RW and maintain balance while he adjusts brace before walking          Pertinent Vitals/Pain   Pain Assessment Pain Assessment: Faces Pain Score: 5  Faces Pain Scale: Hurts a little bit Pain Location: R side back Pain Descriptors / Indicators: Guarding, Discomfort Pain Intervention(s): Monitored during session     Home Living                    Prior Function        UE/LE Assessment               Communication   Communication Communication: No apparent difficulties     Cognition         General Comments General comments (skin integrity, edema, etc.): reinforced back care/prec, log roll, donning the brace, progression of activity.    Exercises     Assessment/Plan    PT Problem List         PT Visit Diagnosis Other abnormalities of gait and mobility (R26.89);Muscle weakness (generalized) (M62.81)    No Skilled PT     Co-evaluation                AMPAC 6 Clicks Help needed turning from your back to your side while in a flat bed without using bedrails?: A Little Help needed moving from lying on your back to sitting on the side  of a flat bed without using bedrails?: A Lot Help needed moving to and from a bed to a chair (including a wheelchair)?: A Little Help needed standing up from a chair using your arms (e.g., wheelchair or bedside chair)?: A Little Help needed to walk in hospital room?: A Little Help needed climbing 3-5 steps with a railing? : A Lot 6 Click Score: 16      End of Session Equipment Utilized During Treatment: Back brace Activity Tolerance: Patient tolerated treatment well Patient left: in chair;with call bell/phone within reach;with bed alarm set Nurse Communication: Mobility status PT Visit Diagnosis: Other abnormalities of gait and mobility (R26.89);Muscle weakness (generalized) (M62.81)      Time: 8570-8494 PT Time Calculation (min) (ACUTE ONLY): 36 min  Charges:     PT Treatments $Gait Training: 8-22 mins $Therapeutic Activity: 8-22 mins    08/29/2024  India HERO., PT Acute Rehabilitation Services [phone]  (office)  Vinie GAILS Antolin Belsito  08/29/2024, 3:31 PM

## 2024-08-29 NOTE — Progress Notes (Signed)
 [facility] Progress Note  Name: Dean Quadros Sr. MRN: [id number] DOB: Jul 07, 1951  Chief Complaint:  Fall with fracture  Subjective:  He had 100 mL UOP over 12/4 and one unmeasured urine void.  He had first HD on 12/4 with 0.5 kg UF after tunneled dialysis catheter placement with IR.   Feels like HD went ok - it just seemed long and he hopes he can come off of it.  States that he made 250 ml of urine once overnight he thinks - he's not sure if this was recorded.  Discussed to make sure to show staff and ask them to write it down.   Review of systems:     some shortness of breath - doing better today Discomfort after his fall with fracture Denies n/v No chest pain   --------------- Background on consult:  Dean Baswell Sr. is a 73 y.o. male with a history including CAD, CKD stage IV, paroxysmal afib, hx cardiac arrest s/p AICD, heart failure with reduced EF, and HTN who presented to the hospital after a fall down the stairs.  He was found to have an acute T6 fracture - neurosurgery was consulted and no surgery is indicated at this time.  He was also noted on admission to have a moderate sized right pleural effusion, suggestive of hemorrhagic or proteinaceous content.  Also with incidental finding of enlarged prostate and body wall edema.  He didn't have any contrasted studies noted.  Cardiology had seen the patient and initiated diuretics and milrinone  was added today.  Strict ins/outs are not available.  He had 250 mL UOP over 12/1 as well as three unmeasured urine voids.  He has had one unmeasured urine void today.  He has had some mild hypotension today.  CVP is low or negative per nursing report.  The milrinone  is now off.  Nephrology is consulted for assistance with management of AKI.  He was bladder scanned and had 0 ml.  He had a thoracentesis this admit with 500 mL bloody fluid.  He follows with Dr. [name] and baseline Cr appears to be 2-3.  He has previously seen Dr.  [name] in heart failure (most recently seen inpt by Dr. Rolan during a 06/2024 admission).  Per cardiology report, heart failure team is concerned that he may not tolerate dialysis.  He previously was on irbesartan  and torsemide  and has gotten intermittent IV lasix .  He states that when he last spoke with renal after his last admission that his creatinine was at baseline and he was recommended to follow-up as scheduled.  He states urine output has gone down ever since he started hydralazine -isosorbide  combination pill.  He would hope to avoid dialysis if possible.  If he needed it, he thinks he would want to dry but really hopes it doesn't come to that.    Intake/Output Summary (Last 24 hours) at 08/29/2024 0926 Last data filed at 08/29/2024 0409 Gross per 24 hour  Intake 20 ml  Output 600 ml  Net -580 ml    Vitals:  Vitals:   08/28/24 2330 08/29/24 0000 08/29/24 0409 08/29/24 0738  BP: (!) 75/55 (!) 107/45 (!) 111/98 104/75  Pulse: (!) 105 (!) 101 73 73  Resp: (!) 22 19 17 20   Temp:   (!) 97.5 F (36.4 C) 97.6 F (36.4 C)  TempSrc:   Oral Oral  SpO2: 97% 100% 99%   Weight:      Height:         Physical Exam:  General adult male in chair with some increased work of breathing HEENT normocephalic atraumatic extraocular movements intact sclera anicteric Neck supple trachea midline Lungs clear but reduced to auscultation bilaterally normal work of breathing at rest on 3 liters nasal cannula Heart S1S2 no rub Abdomen soft nontender obese habitus Extremities bilateral 3+ edema with ulcer on right lower leg which has a gauze wrapping  Psych normal mood and affect Neuro alert and oriented x 3 provides hx and follows commands GU - no foley in place   Medications reviewed   Labs:     Latest Ref Rng & Units 08/29/2024    6:12 AM 08/28/2024    6:17 AM 08/27/2024    4:31 AM  BMP  Glucose 70 - 99 mg/dL 893  94  895   BUN 8 - 23 mg/dL 65  91  84   Creatinine 0.61 - 1.24 mg/dL 6.47   4.92  5.10   Sodium 135 - 145 mmol/L 132  130  131   Potassium 3.5 - 5.1 mmol/L 4.6  5.5  5.7   Chloride 98 - 111 mmol/L 96  100  102   CO2 22 - 32 mmol/L 25  21  21    Calcium  8.9 - 10.3 mg/dL 9.6  9.5  9.7      Assessment/Plan:    # AKI - Cardiorenal and may be intravascularly dry per CVP.  He is peripherally overloaded but with anemia and mild hypoalbuminemia.  Urine output seems to have dropped with hypotension.  Started HD on 12/4 after tunneled catheter placement with IR - HD today and plan for HD on 12/6 to optimize volume  - urine output low but seems to be improving - reassess lasix  daily - strict ins/outs and daily weights.  Daily weights are not consistently charted.  I have asked nursing for help with this.  RN is going to see about getting a scale to the floor for standing weights as this is not currently available on the unit   # Chronic heart failure with reduced Ejection fraction - note TTE 04/2024 with LVEF 35-40% and grade 2 diastolic dysfunction as well as moderately elevated pulmonary artery systolic pressure - optimize volume status with HD  - elevate legs    # Acute hypoxic respiratory failure  - on supplemental oxygen  - with overload - difficult to diurese - not responding to lasix /albumin    # Hyperkalemia - improved with HD - s/p lokelma  - on renal diabetic diet    # CKD stage IV - Follows with Dr. [name] - Baseline Cr 2-3 - for reference, no SGLT2 inhibitor due to hx of UTI's   # Anemia of CKD - s/p aranesp  40 mcg once on 12/2 to optimize anemia.  Follow for additional needs - iron  deficiency - see that iron  is ordered   # Right-sided Pleural effusion  - s/p thoracentesis of 500 mL bloody fluid with IR   Disposition - continue inpatient monitoring     Katheryn JAYSON Saba, MD 08/29/2024 9:44 AM

## 2024-08-29 NOTE — Procedures (Signed)
 PROCEDURE SUMMARY:  Successful US  guided right thoracentesis. Yielded 1.5 liters of bloody fluid. Pt tolerated procedure well. No immediate complications.  Specimen was sent for labs. CXR ordered.  EBL < 5 mL  Solmon Selmer Ku PA-C 08/29/2024 12:42 PM

## 2024-08-29 NOTE — Progress Notes (Signed)
 Patient taken off unit by transport to hemodialysis.

## 2024-08-29 NOTE — Progress Notes (Signed)
 PROGRESS NOTE    Dean Croft Sr.  FMW:[id number] DOB: 1951/01/28 DOA: 08/22/2024 PCP: [name], MD  Subjective: Patient reports feeling okay.  Reports discomfort around the HD site, but states that the first HD session yesterday went well.  He denied any shortness of breath.  Has been trying to keep his legs elevated    Hospital Course: 7M h/o CAD, PAF, ICM, cardiac arrest s/p AICD, HFrEF (EF 35-40% in 04/2024), HTN, HLD, DM2, OSA, and last admission in 06/2204 for HFrEF exacerbation who p/w back pain s/p GLF from wheelchair and found to have AKI and acute T6 fracture. Recent OP Cards tele appointment note on 11/20 suggested multiple missed appointments and recent increase in edema and uptitration of torsemide  to 60mg  daily. Hospital stay complicated with HF With worsening renal function. Cardiology and renal following. HD line was placed on 12/4 and HD initiated. Underwent thoracentesis on 11/28 and again on 12/5 for worsening right sided pleural effusion. Developed anemia and started on IV iron     Assessment and Plan:  Cardiorenal syndrome with renal failure/Low output biventricular heart failure - milrinone  was initially started but held due to hypotension. 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 [name]) -TLSO brace for now -PT/OT consulted   R pleural effusion RML/RLL  consolidation - s/p IR guided thoracentesis on 11/28 with removal of 500 mL of bloody fluid  - repeat chest x ray showed increased right sided pleural effusion - repeat thoracentesis on 12/5, with removal of 1.5L of bloody fluid  - on 3L [place], continue to monitor respiratory status    Hyperkalemia - due to AKI on CKD - resolved with HD - monitor daily labs    Liver enhancement on imaging -OP f/u with Cards/GI to discuss amiodarone  toxicity   PAF -PTA amiodarone  200mg  daily and Eliquis  5mg  BID (currently held)     DVT prophylaxis: heparin  injection 5,000 Units Start: 08/27/24 1615    Code Status: Limited: Do not attempt resuscitation (DNR) -DNR-LIMITED -Do Not Intubate/DNI  Disposition Plan: TBD Reason for continuing need for hospitalization: Not med ready   Objective: Vitals:   08/29/24 0409 08/29/24 0738 08/29/24 1202 08/29/24 1211  BP: (!) 111/98 104/75 (!) 127/49 (!) 114/45  Pulse: 73 73    Resp: 17 20    Temp: (!) 97.5 F (36.4 C) 97.6 F (36.4 C)    TempSrc: Oral Oral    SpO2: 99%     Weight:      Height:        Intake/Output Summary (Last 24 hours) at 08/29/2024 1504 Last data filed at 08/29/2024 0409 Gross per 24 hour  Intake --  Output 600 ml  Net -600 ml   Filed Weights   08/25/24 0500 08/27/24 0728 08/28/24 0706  Weight: 91.9 kg 97 kg 94.6 kg    Examination:  Physical Exam Constitutional:      General: He is not in acute distress.    Comments: On 3L [place]  Cardiovascular:     Rate and Rhythm: Normal rate.  Pulmonary:     Effort: No respiratory distress.     Comments: Decreased breath sounds on the right lower lung base Abdominal:     General: There is distension.     Palpations: Abdomen is soft.     Tenderness: There is no abdominal tenderness.  Musculoskeletal:     Right lower leg: Edema present.     Left lower leg: Edema present.     Data Reviewed: I have personally reviewed following labs and imaging studies  CBC: Recent Labs  Lab  08/24/24 0318 08/25/24 0926 08/26/24 0345 08/27/24 0431 08/28/24 0935  WBC 6.2 6.7 7.7 8.8 5.9  HGB 8.2* 8.8* 8.4* 7.4* 7.1*  HCT 25.5* 28.4* 26.2* 23.5* 21.7*  MCV 92.1 93.7 91.9 92.2 92.7  PLT 134* 190 200 184 176   Basic Metabolic Panel: Recent Labs  Lab 08/25/24 0926 08/26/24 0345 08/26/24 1332 08/27/24 0431 08/28/24 0617 08/29/24 0612  NA 133* 131*  --  131* 130* 132*  K 5.2* 5.1  --  5.7* 5.5* 4.6  CL 101 101  --  102 100 96*  CO2 24 20*  --  21* 21* 25  GLUCOSE 85 113*  --  104* 94 106*  BUN 70* 81*  --  84* 91* 65*  CREATININE 3.78* 4.32* 5.01* 4.89* 5.07* 3.52*  CALCIUM  9.6 9.6  --  9.7 9.5 9.6  MG  --  1.8  --  1.7 1.9 1.9   GFR: Estimated Creatinine Clearance: 19.8 mL/min (A) (by C-G formula based on SCr of 3.52 mg/dL (H)). Liver Function Tests: Recent Labs  Lab 08/25/24 1351  AST 21  ALT 15  ALKPHOS 71  BILITOT 1.4*  PROT 6.7  ALBUMIN  3.1*   No results for input(s): LIPASE, AMYLASE in the last 168 hours. No results for input(s): AMMONIA in the last 168 hours. Coagulation Profile: No results for input(s): INR, PROTIME in the last 168 hours. Cardiac Enzymes: No results for input(s): CKTOTAL, CKMB, CKMBINDEX, TROPONINI in the last 168 hours. ProBNP, BNP (last 5 results) Recent Labs    05/04/24 0333 05/16/24 1308 06/25/24 1604 07/10/24 0227 08/22/24 0903  BNP 2,770.3* 618.2* 1,585.3* 1,952.1* 525.0*   HbA1C: No results for input(s): HGBA1C in the last 72 hours. CBG: No results for input(s): GLUCAP in the last 168 hours. Lipid Profile: No results for input(s): CHOL, HDL, LDLCALC, TRIG, CHOLHDL, LDLDIRECT in the last 72 hours. Thyroid Function Tests: No results for input(s): TSH, T4TOTAL, FREET4, T3FREE, THYROIDAB in the last 72 hours. Anemia Panel: Recent Labs    08/28/24 0935  FERRITIN 95  TIBC 330  IRON  22*   Sepsis Labs: Recent Labs  Lab 08/25/24 1351 08/25/24 1607 08/26/24 1332  08/27/24 1855  LATICACIDVEN 1.8 1.5 1.6 0.8    Recent Results (from the past 240 hours)  Body fluid culture w Gram Stain     Status: None   Collection Time: 08/22/24 12:22 PM   Specimen: Lung, Right; Pleural Fluid  Result Value Ref Range Status   Specimen Description PLEURAL  Final   Special Requests RIGHT LUNG  Final   Gram Stain   Final    RARE WBC PRESENT, PREDOMINANTLY PMN NO ORGANISMS SEEN    Culture   Final    NO GROWTH 3 DAYS Performed at [facility]  Lab, [address] [place], [place] [address]    Report Status 08/25/2024 FINAL  Final     Radiology Studies: DG Chest 1 View Result Date: 08/29/2024 EXAM: 1 VIEW(S) XRAY OF THE CHEST 08/29/2024 12:29:00 PM COMPARISON: Yesterday's study (08/28/2024). CLINICAL HISTORY: [id number] S/P thoracentesis [id number] S/P thoracentesis. FINDINGS: LINES, TUBES AND DEVICES: Interval placement of right ankle jugular catheter with distal tip inspected position is best seen. Right-sided PICC line is unchanged. LUNGS AND PLEURA: Small pleural effusions are noted. Minimal right basilar atelectasis. No focal pulmonary opacity. No pneumothorax. HEART AND MEDIASTINUM: Stable cardiomegaly. Left-sided defibrillator is unchanged. BONES AND SOFT TISSUES: No acute osseous abnormality. IMPRESSION: 1. Small pleural effusions. 2. Stable cardiomegaly with unchanged left-sided defibrillator. 3. Right internal jugular catheter placed with distal tip in appropriate position. Unchanged right-sided PICC line. 4. Minimal right basilar atelectasis. Electronically signed by: Lynwood Seip MD 08/29/2024 12:56 PM EST RP Workstation: [id number]   IR TUNNELED CENTRAL VENOUS CATH [facility] W IMG Result Date: 08/28/2024 INDICATION: ESRD requiring HD initiation. EXAM: TUNNELED CENTRAL VENOUS HEMODIALYSIS CATHETER PLACEMENT WITH ULTRASOUND AND FLUOROSCOPIC GUIDANCE MEDICATIONS: Ancef  2 gm IV. The antibiotic was given in an appropriate time interval prior to skin puncture.  ANESTHESIA/SEDATION: Local anesthetic and single agent sedation was employed during this procedure. A total of Versed  1 mg was administered intravenously. The patient's level of consciousness and vital signs were monitored continuously by radiology nursing throughout the procedure under my direct supervision. FLUOROSCOPY: Radiation Exposure Index and estimated peak skin dose (PSD); Reference air kerma (RAK), 7.3 mGy. COMPLICATIONS: None immediate. PROCEDURE: Informed written consent was obtained from the patient and/or patient's representative after a discussion of the risks, benefits, and alternatives to treatment. Questions regarding the procedure were encouraged and answered. The RIGHT neck and chest were prepped with chlorhexidine  in a sterile fashion, and a sterile drape was applied covering the operative field. Maximum barrier sterile technique with sterile gowns and gloves were used for the procedure. A timeout was performed prior to the initiation of the procedure. After creating a small venotomy incision, a micropuncture kit was utilized to access the internal jugular vein. Real-time ultrasound guidance was utilized for vascular access including the acquisition of a permanent ultrasound image documenting patency of the accessed vessel. The microwire was utilized to measure appropriate catheter length. A stiff Glidewire was advanced to the level of the IVC and the micropuncture sheath was exchanged for a peel-away sheath. A palindrome tunneled hemodialysis catheter measuring 19 cm from tip to cuff was tunneled in a retrograde fashion from the anterior chest wall to the venotomy incision. The catheter was then placed through the peel-away sheath with tips ultimately positioned within the superior aspect of the right atrium. Final catheter positioning was confirmed and documented with a spot radiographic image. The catheter aspirates and flushes normally. The catheter was flushed with appropriate volume  heparin  dwells. The catheter exit site was secured with a 2-0 Ethilon retention suture. The venotomy incision was closed with Dermabond. Dressings were applied. The patient tolerated the procedure well without immediate post procedural complication. IMPRESSION: Successful placement of 19 cm tip to cuff tunneled hemodialysis catheter via the RIGHT internal jugular vein. The tip of the catheter is positioned at the superior cavo-atrial junction. The catheter is ready for immediate use. Thom Hall, MD [facility] Radiology Electronically Signed   By: Thom Hall M.D.   On: 08/28/2024 16:41   DG CHEST PORT 1 VIEW Result Date: 08/28/2024 CLINICAL DATA:  Pleural  effusion EXAM: PORTABLE CHEST 1 VIEW COMPARISON:  Chest radiograph dated 08/27/2024 FINDINGS: Lines/tubes: Left chest wall ICD lead projects over the right ventricle. Right upper extremity PICC tip projects over the SVC. Lungs: Low lung volumes with bronchovascular crowding. No focal consolidations. Pleura: Unchanged large right pleural effusion.  No pneumothorax. Heart/mediastinum: Right heart border is obscured. Bones: No acute osseous abnormality. IMPRESSION: 1. Unchanged large right pleural effusion. 2. Low lung volumes with bronchovascular crowding. Electronically Signed   By: Limin  Xu M.D.   On: 08/28/2024 07:26    Scheduled Meds:  acetaminophen   650 mg Oral Q6H   amiodarone   200 mg Oral Daily   atorvastatin   40 mg Oral Daily   Chlorhexidine  Gluconate Cloth  6 each Topical Daily   Chlorhexidine  Gluconate Cloth  6 each Topical Q0600   Chlorhexidine  Gluconate Cloth  6 each Topical Q0600   collagenase    Topical Daily   heparin  injection (subcutaneous)  5,000 Units Subcutaneous Q8H   heparin  sodium (porcine)  10,000 Units Intracatheter Once   hydrocerin   Topical BID   lidocaine -EPINEPHrine   20 mL Intradermal Once   lidocaine -EPINEPHrine   20 mL Intradermal Once   polyethylene glycol  17 g  Oral Daily   sodium chloride  flush  10-40 mL Intracatheter Q12H   Continuous Infusions:  iron  sucrose     milrinone  Stopped (08/26/24 1316)     LOS: 7 days   Time spent: 40 minutes  Casimer Dare, MD  [facility]  08/29/2024, 3:04 PM

## 2024-08-29 NOTE — Progress Notes (Signed)
 DAILY PROGRESS NOTE   Patient Name: Dean [name] Sr. Date of Encounter: 08/29/2024 Cardiologist: Vinie JAYSON Maxcy, MD  Chief Complaint   No complaints  Patient Profile   Dean [name] Sr. is a 73 y.o. 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 Juvenal.   Subjective   No complaints. Underwent placement of a dialysis cath and dialyzed overnight- Creatinine 3.52 today. Potassium normal at 4.6. CO-ox is 90.2 (Was 96.1).  Objective   Vitals:   08/28/24 2330 08/29/24 0000 08/29/24 0409 08/29/24 0738  BP: (!) 75/55 (!) 107/45 (!) 111/98 104/75  Pulse: (!) 105 (!) 101 73 73  Resp: (!) 22 19 17 20   Temp:   (!) 97.5 F (36.4 C) 97.6 F (36.4 C)  TempSrc:   Oral Oral  SpO2: 97% 100% 99%   Weight:      Height:        Intake/Output Summary (Last 24 hours) at 08/29/2024 9167 Last data filed at 08/29/2024 0409 Gross per 24 hour  Intake 20 ml  Output 600 ml  Net -580 ml   Filed Weights   08/25/24 0500 08/27/24 0728 08/28/24 0706  Weight: 91.9 kg 97 kg 94.6 kg    Physical Exam   General appearance: alert and no distress Neck: no carotid bruit, no JVD, and thyroid not enlarged, symmetric, no tenderness/mass/nodules Lungs: diminished breath sounds RLL Heart: regular rate and rhythm Abdomen: soft, non-tender; bowel sounds normal; no masses,  no organomegaly and in a back brace Extremities: edema 3+ bilateral pitting edema Neurologic: Grossly normal  Inpatient Medications    Scheduled Meds:  acetaminophen   650 mg Oral Q6H   amiodarone   200 mg Oral Daily   atorvastatin   40 mg Oral Daily   Chlorhexidine  Gluconate Cloth  6 each Topical Daily   Chlorhexidine  Gluconate Cloth  6 each Topical Q0600   Chlorhexidine  Gluconate Cloth  6 each Topical Q0600   collagenase     Topical Daily   heparin  injection (subcutaneous)  5,000 Units Subcutaneous Q8H   heparin  sodium (porcine)  10,000 Units Intracatheter Once   hydrocerin   Topical BID   lidocaine -EPINEPHrine   20 mL Intradermal Once   polyethylene glycol  17 g Oral Daily   sodium chloride  flush  10-40 mL Intracatheter Q12H   sodium zirconium cyclosilicate   10 g Oral Once    Continuous Infusions:  milrinone  Stopped (08/26/24 1316)    PRN Meds: HYDROmorphone  (DILAUDID ) injection, methocarbamol , oxyCODONE , sodium chloride  flush   Labs   Results for orders placed or performed during the hospital encounter of 08/22/24 (from the past 48 hours)  Lactic acid, plasma     Status: None   Collection Time: 08/27/24  6:55 PM  Result Value Ref Range   Lactic Acid, Venous 0.8 0.5 - 1.9 mmol/L    Comment: Performed at [facility], [address] [place], [place] [address]  Cooxemetry Panel (carboxy, met, total hgb, O2 sat)     Status: Abnormal   Collection Time: 08/27/24  7:30 PM  Result Value Ref Range   Total hemoglobin 8.1 (L) 12.0 - 16.0 g/dL   O2 Saturation 23.5 %   Carboxyhemoglobin 2.1 (H) 0.5 - 1.5 %   Methemoglobin <0.7 0.0 - 1.5 %    Comment: Performed at [name],  [address], [place], [place] [address]  Cooxemetry Panel (carboxy, met, total hgb, O2 sat)     Status: Abnormal   Collection Time: 08/28/24  6:06 AM  Result Value Ref Range   Total hemoglobin 7.5 (L) 12.0 - 16.0 g/dL   O2 Saturation 03.8 %   Carboxyhemoglobin 2.3 (H) 0.5 - 1.5 %   Methemoglobin <0.7 0.0 - 1.5 %    Comment: Performed at [facility], [address], [place], [place] [address]  Basic metabolic panel     Status: Abnormal   Collection Time: 08/28/24  6:17 AM  Result Value Ref Range   Sodium 130 (L) 135 - 145 mmol/L   Potassium 5.5 (H) 3.5 - 5.1 mmol/L   Chloride 100 98 - 111 mmol/L   CO2 21 (L) 22 - 32 mmol/L   Glucose, Bld 94 70 - 99 mg/dL    Comment: Glucose reference range applies only to  samples taken after fasting for at least 8 hours.   BUN 91 (H) 8 - 23 mg/dL   Creatinine, Ser 4.92 (H) 0.61 - 1.24 mg/dL   Calcium  9.5 8.9 - 10.3 mg/dL   GFR, Estimated 11 (L) >60 mL/min    Comment: (NOTE) Calculated using the CKD-EPI Creatinine Equation (2021)    Anion gap 9 5 - 15    Comment: Performed at [facility], [address], [place], [place] [address]  Magnesium      Status: None   Collection Time: 08/28/24  6:17 AM  Result Value Ref Range   Magnesium  1.9 1.7 - 2.4 mg/dL    Comment: Performed at [facility], [address], [place], [place] [address]  Iron  and TIBC     Status: Abnormal   Collection Time: 08/28/24  9:35 AM  Result Value Ref Range   Iron  22 (L) 45 - 182 ug/dL   TIBC 669 749 - 549 ug/dL   Saturation Ratios 7 (L) 17.9 - 39.5 %   UIBC 308 ug/dL    Comment: Performed at [facility], [address], [place], [place] [address]  Ferritin     Status: None   Collection Time: 08/28/24  9:35 AM  Result Value Ref Range   Ferritin 95 24 - 336 ng/mL    Comment: Performed at [facility], [address], [place], [place] [address]  CBC     Status: Abnormal   Collection Time: 08/28/24  9:35 AM  Result Value Ref Range   WBC 5.9 4.0 - 10.5 K/uL   RBC 2.34 (L) 4.22 - 5.81 MIL/uL   Hemoglobin 7.1 (L) 13.0 - 17.0 g/dL   HCT 78.2 (L) 60.9 - 47.9 %   MCV 92.7 80.0 - 100.0 fL   MCH 30.3 26.0 - 34.0 pg   MCHC 32.7 30.0 - 36.0 g/dL   RDW 85.2 88.4 - 84.4 %   Platelets 176 150 - 400 K/uL   nRBC 0.0 0.0 - 0.2 %    Comment: Performed at [facility], [address], [place], [place] [address]  Hepatitis B surface antigen     Status: None   Collection Time: 08/28/24  9:35 AM  Result Value Ref Range   Hepatitis B Surface Ag NON REACTIVE NON REACTIVE    Comment: Performed at [facility], [address], [place], [place] [address]  Hepatitis B surface antibody,quantitative     Status: Abnormal   Collection Time: 08/28/24  9:35 AM  Result Value Ref Range   Hep B S AB Quant (Post) <3.5 (L) Immunity>10 mIU/mL    Comment: (NOTE)  Status of Immunity                     Anti-HBs Level  ------------------                     -------------- Inconsistent with Immunity                  0.0 - 10.0 Consistent with Immunity                         >10.0 Performed At: [facility] [address] [place], [place] [id number] Jennette Shorter MD Ey:[phone]   Cooxemetry Panel (carboxy, met, total hgb, O2 sat)     Status: Abnormal   Collection Time: 08/29/24  5:55 AM  Result Value Ref Range   Total hemoglobin 7.5 (L) 12.0 - 16.0 g/dL   O2 Saturation 09.7 %   Carboxyhemoglobin 3.9 (H) 0.5 - 1.5 %   Methemoglobin 2.2 (H) 0.0 - 1.5 %    Comment: Performed at [facility], [address] [place], [place] [address]  Basic metabolic panel     Status: Abnormal   Collection Time: 08/29/24  6:12 AM  Result Value Ref Range   Sodium 132 (L) 135 - 145 mmol/L   Potassium 4.6 3.5 - 5.1 mmol/L   Chloride 96 (L) 98 - 111 mmol/L   CO2 25 22 - 32 mmol/L   Glucose, Bld 106 (H) 70 - 99 mg/dL    Comment: Glucose reference range applies only to samples taken after fasting for at least 8 hours.   BUN 65 (H) 8 - 23 mg/dL   Creatinine, Ser 6.47 (H) 0.61 - 1.24 mg/dL   Calcium  9.6 8.9 - 10.3 mg/dL   GFR, Estimated 17 (L) >60 mL/min    Comment: (NOTE) Calculated using the CKD-EPI Creatinine Equation (2021)    Anion gap 11 5 - 15    Comment: Performed at [facility], [address] [place], [place] [address]  Magnesium      Status: None   Collection Time: 08/29/24  6:12 AM  Result Value Ref Range   Magnesium  1.9 1.7 - 2.4 mg/dL    Comment: Performed at [facility], [address] [place], [place] [address]    ECG   Paced rhythm - Personally Reviewed  Telemetry   Paced rhythm - Personally Reviewed  Radiology    IR TUNNELED CENTRAL VENOUS CATH [facility] W IMG Result Date: 08/28/2024 INDICATION: ESRD requiring  HD initiation. EXAM: TUNNELED CENTRAL VENOUS HEMODIALYSIS CATHETER PLACEMENT WITH ULTRASOUND AND FLUOROSCOPIC GUIDANCE MEDICATIONS: Ancef  2 gm IV. The antibiotic was given in an appropriate time interval prior to skin puncture. ANESTHESIA/SEDATION: Local anesthetic and single agent sedation was employed during this procedure. A total of Versed  1 mg was administered intravenously. The patient's level of consciousness and vital signs were monitored continuously by radiology nursing throughout the procedure under my direct supervision. FLUOROSCOPY: Radiation Exposure Index and estimated peak skin dose (PSD); Reference air kerma (RAK), 7.3 mGy. COMPLICATIONS: None immediate. PROCEDURE: Informed written consent was obtained from the patient and/or patient's representative after a discussion of the risks, benefits, and alternatives to treatment. Questions regarding the procedure were encouraged and answered. The RIGHT neck and chest were prepped with chlorhexidine  in a sterile fashion, and a  sterile drape was applied covering the operative field. Maximum barrier sterile technique with sterile gowns and gloves were used for the procedure. A timeout was performed prior to the initiation of the procedure. After creating a small venotomy incision, a micropuncture kit was utilized to access the internal jugular vein. Real-time ultrasound guidance was utilized for vascular access including the acquisition of a permanent ultrasound image documenting patency of the accessed vessel. The microwire was utilized to measure appropriate catheter length. A stiff Glidewire was advanced to the level of the IVC and the micropuncture sheath was exchanged for a peel-away sheath. A palindrome tunneled hemodialysis catheter measuring 19 cm from tip to cuff was tunneled in a retrograde fashion from the anterior chest wall to the venotomy incision. The catheter was then placed through the peel-away sheath with tips ultimately positioned within  the superior aspect of the right atrium. Final catheter positioning was confirmed and documented with a spot radiographic image. The catheter aspirates and flushes normally. The catheter was flushed with appropriate volume heparin  dwells. The catheter exit site was secured with a 2-0 Ethilon retention suture. The venotomy incision was closed with Dermabond. Dressings were applied. The patient tolerated the procedure well without immediate post procedural complication. IMPRESSION: Successful placement of 19 cm tip to cuff tunneled hemodialysis catheter via the RIGHT internal jugular vein. The tip of the catheter is positioned at the superior cavo-atrial junction. The catheter is ready for immediate use. Thom Hall, MD [facility] Radiology Electronically Signed   By: Thom Hall M.D.   On: 08/28/2024 16:41   DG CHEST PORT 1 VIEW Result Date: 08/28/2024 CLINICAL DATA:  Pleural effusion EXAM: PORTABLE CHEST 1 VIEW COMPARISON:  Chest radiograph dated 08/27/2024 FINDINGS: Lines/tubes: Left chest wall ICD lead projects over the right ventricle. Right upper extremity PICC tip projects over the SVC. Lungs: Low lung volumes with bronchovascular crowding. No focal consolidations. Pleura: Unchanged large right pleural effusion.  No pneumothorax. Heart/mediastinum: Right heart border is obscured. Bones: No acute osseous abnormality. IMPRESSION: 1. Unchanged large right pleural effusion. 2. Low lung volumes with bronchovascular crowding. Electronically Signed   By: Limin  Xu M.D.   On: 08/28/2024 07:26   DG CHEST PORT 1 VIEW Result Date: 08/27/2024 CLINICAL DATA:  Shortness of breath.  History of asthma. EXAM: PORTABLE CHEST 1 VIEW COMPARISON:  08/26/2024 FINDINGS: Right PICC tip in the superior vena cava proximally 1 cm superior to the superior cavoatrial junction. Stable left subclavian AICD lead and generator. Stable enlarged cardiac silhouette. Large right pleural  effusion, increased. No significant change in associated right basilar atelectasis. Clear left lung. Tortuous and partially calcified thoracic aorta. No acute bony abnormality. IMPRESSION: 1. Large right pleural effusion, increased. 2. Stable cardiomegaly. 3. Right PICC tip in the superior vena cava, proximally 1 cm superior to the superior cavoatrial junction. Electronically Signed   By: Elspeth Bathe M.D.   On: 08/27/2024 14:47    Cardiac Studies   N/A  Assessment   Principal Problem:   Multiple traumatic injuries Active Problems:   Cardiorenal syndrome with renal failure   Cardiorenal syndrome Low output biventricular heart failure  Plan   Dean [name] had dialysis yesterday - seemed to tolerate it well. He is having difficulty adjusting to the idea of dialysis and hopes it is only temporary. Co-Ox remains high off inotropes. Blood pressure remains low normal to normal.  TIME SPENT WITH PATIENT: 35 minutes of direct patient care. More than 50% of that time  was spent on coordination of care and counseling regarding biventricular congestive heart failure and end-stage renal failure.  Length of Stay:  LOS: 7 days   Vinie KYM Maxcy, MD, [facility], FNLA, FACP  [place]  [facility]  Medical Director of the [facility] Lip[facility]  Cardiovascular Risk Reduction Clinic Diplomate of the American Board of Clinical Lipidology Attending Cardiologist  Direct Dial: [phone]  Fax: [phone]  Website:  [email or web address] Vinie BROCKS Kachina Niederer 08/29/2024, 8:32 AM

## 2024-08-29 NOTE — Progress Notes (Signed)
 Subjective: CC: S/p HD yesterday. Seen with RN. Reports he is scheduled for IR thoracentesis and another session of HD today.  Reports sob is better today. Remains on 3L. Up to the chair today.  Objective: Vital signs in last 24 hours: Temp:  [97.4 F (36.3 C)-97.9 F (36.6 C)] 97.6 F (36.4 C) (12/05 0738) Pulse Rate:  [73-105] 73 (12/05 0738) Resp:  [16-26] 20 (12/05 0738) BP: (75-132)/(31-98) 104/75 (12/05 0738) SpO2:  [87 %-100 %] 99 % (12/05 0409) Last BM Date : 08/26/24  Intake/Output from previous day: 12/04 0701 - 12/05 0700 In: 20 [I.V.:20] Out: 600 [Urine:100] Intake/Output this shift: No intake/output data recorded.  PE: Gen:  Alert, NAD, pleasant Card:  Reg rate Pulm: Decreased breath sounds on right slightly compared to left but lung sounds heard in all fields b/l. On 3L Ext:  Noted bilateral lower extremity edema  Lab Results:  Recent Labs    08/27/24 0431 08/28/24 0935  WBC 8.8 5.9  HGB 7.4* 7.1*  HCT 23.5* 21.7*  PLT 184 176   BMET Recent Labs    08/28/24 0617 08/29/24 0612  NA 130* 132*  K 5.5* 4.6  CL 100 96*  CO2 21* 25  GLUCOSE 94 106*  BUN 91* 65*  CREATININE 5.07* 3.52*  CALCIUM  9.5 9.6   PT/INR No results for input(s): LABPROT, INR in the last 72 hours. CMP     Component Value Date/Time   NA 132 (L) 08/29/2024 0612   NA 140 06/04/2024 1445   K 4.6 08/29/2024 0612   CL 96 (L) 08/29/2024 0612   CO2 25 08/29/2024 0612   GLUCOSE 106 (H) 08/29/2024 0612   BUN 65 (H) 08/29/2024 0612   BUN 23 06/04/2024 1445   CREATININE 3.52 (H) 08/29/2024 0612   CALCIUM  9.6 08/29/2024 0612   PROT 6.7 08/25/2024 1351   PROT 6.6 05/16/2024 1308   ALBUMIN  3.1 (L) 08/25/2024 1351   ALBUMIN  3.5 (L) 05/16/2024 1308   AST 21 08/25/2024 1351   ALT 15 08/25/2024 1351   ALKPHOS 71 08/25/2024 1351   BILITOT 1.4 (H) 08/25/2024 1351   BILITOT 2.0 (H) 05/16/2024 1308   GFRNONAA 17 (L) 08/29/2024 0612   GFRAA 59 (L) 11/02/2020 1457    Lipase     Component Value Date/Time   LIPASE 28 04/16/2024 2252    Studies/Results: IR TUNNELED CENTRAL VENOUS CATH [facility] IMG Result Date: 08/28/2024 INDICATION: ESRD requiring HD initiation. EXAM: TUNNELED CENTRAL VENOUS HEMODIALYSIS CATHETER PLACEMENT WITH ULTRASOUND AND FLUOROSCOPIC GUIDANCE MEDICATIONS: Ancef  2 gm IV. The antibiotic was given in an appropriate time interval prior to skin puncture. ANESTHESIA/SEDATION: Local anesthetic and single agent sedation was employed during this procedure. A total of Versed  1 mg was administered intravenously. The patient's level of consciousness and vital signs were monitored continuously by radiology nursing throughout the procedure under my direct supervision. FLUOROSCOPY: Radiation Exposure Index and estimated peak skin dose (PSD); Reference air kerma (RAK), 7.3 mGy. COMPLICATIONS: None immediate. PROCEDURE: Informed written consent was obtained from the patient and/or patient's representative after a discussion of the risks, benefits, and alternatives to treatment. Questions regarding the procedure were encouraged and answered. The RIGHT neck and chest were prepped with chlorhexidine  in a sterile fashion, and a sterile drape was applied covering the operative field. Maximum barrier sterile technique with sterile gowns and gloves were used for the procedure. A timeout was performed prior to the initiation of the procedure. After creating a small venotomy  incision, a micropuncture kit was utilized to access the internal jugular vein. Real-time ultrasound guidance was utilized for vascular access including the acquisition of a permanent ultrasound image documenting patency of the accessed vessel. The microwire was utilized to measure appropriate catheter length. A stiff Glidewire was advanced to the level of the IVC and the micropuncture sheath was exchanged for a peel-away sheath. A palindrome tunneled hemodialysis catheter measuring 19 cm from tip to cuff  was tunneled in a retrograde fashion from the anterior chest wall to the venotomy incision. The catheter was then placed through the peel-away sheath with tips ultimately positioned within the superior aspect of the right atrium. Final catheter positioning was confirmed and documented with a spot radiographic image. The catheter aspirates and flushes normally. The catheter was flushed with appropriate volume heparin  dwells. The catheter exit site was secured with a 2-0 Ethilon retention suture. The venotomy incision was closed with Dermabond. Dressings were applied. The patient tolerated the procedure well without immediate post procedural complication. IMPRESSION: Successful placement of 19 cm tip to cuff tunneled hemodialysis catheter via the RIGHT internal jugular vein. The tip of the catheter is positioned at the superior cavo-atrial junction. The catheter is ready for immediate use. Thom Hall, MD [facility] Radiology Electronically Signed   By: Thom Hall M.D.   On: 08/28/2024 16:41   DG CHEST PORT 1 VIEW Result Date: 08/28/2024 CLINICAL DATA:  Pleural effusion EXAM: PORTABLE CHEST 1 VIEW COMPARISON:  Chest radiograph dated 08/27/2024 FINDINGS: Lines/tubes: Left chest wall ICD lead projects over the right ventricle. Right upper extremity PICC tip projects over the SVC. Lungs: Low lung volumes with bronchovascular crowding. No focal consolidations. Pleura: Unchanged large right pleural effusion.  No pneumothorax. Heart/mediastinum: Right heart border is obscured. Bones: No acute osseous abnormality. IMPRESSION: 1. Unchanged large right pleural effusion. 2. Low lung volumes with bronchovascular crowding. Electronically Signed   By: Limin  Xu M.D.   On: 08/28/2024 07:26    Anti-infectives: Anti-infectives (From admission, onward)    Start     Dose/Rate Route Frequency Ordered Stop   08/28/24 1345  ceFAZolin  (ANCEF ) IVPB 2g/100 mL premix        2  g 200 mL/hr over 30 Minutes Intravenous To Radiology 08/28/24 1247 08/28/24 1545        Assessment/Plan Fall from wheelchair T6 vb fx - Per NSGY, TLSO brace, outpatient follow up in 2 weeks. Moderate right pleural effusion - Did have small bilateral pleural effusions on CT in mid October.  - S/p thoracentesis 11/28 with bloody output. Cx NGTD - HF team following. Defer diuretics to their team with AKI. S/p lasix  12/4.  - CXR 12/3 with worsening pleural effusion. Unchanged large right pleural effusion 12/4. IR planning repeat thoracentesis today per report.  - Cont aggressive pulmonary toilet, mobilize as able  FEN: Renal VTE: SCD's, SQH, hold DOAC for now Foley: none ID: none Dispo: Progressive care   - Per primary -  Chronic heart failure (ischemic cardiomyopathy) - HF following Hx VT/VF arrest and AICD Hx hypertension Hx OSA on CPAP Hx A-fib on Eliquis  Hx type 2 diabetes  AKI - Per Nephrology. Getting HD Anemia - Hgb 7.1 12/4. Recommend repeat labs for monitoring.  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    LOS: 7 days    Dean [name], [facility] 08/29/2024, 9:56 AM Please see Amion for pager number during day hours 7:00am-4:30pm

## 2024-08-29 NOTE — Progress Notes (Signed)
 OT Cancellation Note  Patient Details Name: Dean Michel Sr. MRN: [id number] DOB: [birth year]   Cancelled Treatment:    Reason Eval/Treat Not Completed: Patient at procedure or test/ unavailable (thoracentesis & then working with PT - OT to follow up when able.)  [name] 08/29/2024, 3:51 PM

## 2024-08-30 ENCOUNTER — Inpatient Hospital Stay (HOSPITAL_COMMUNITY)

## 2024-08-30 DIAGNOSIS — T07XXXA Unspecified multiple injuries, initial encounter: Secondary | ICD-10-CM | POA: Diagnosis not present

## 2024-08-30 LAB — BASIC METABOLIC PANEL WITH GFR
Anion gap: 12 (ref 5–15)
BUN: 41 mg/dL — ABNORMAL HIGH (ref 8–23)
CO2: 25 mmol/L (ref 22–32)
Calcium: 9.1 mg/dL (ref 8.9–10.3)
Chloride: 98 mmol/L (ref 98–111)
Creatinine, Ser: 2.54 mg/dL — ABNORMAL HIGH (ref 0.61–1.24)
GFR, Estimated: 26 mL/min — ABNORMAL LOW (ref >60)
Glucose, Bld: 125 mg/dL — ABNORMAL HIGH (ref 70–99)
Potassium: 3.7 mmol/L (ref 3.5–5.1)
Sodium: 135 mmol/L (ref 135–145)

## 2024-08-30 LAB — MAGNESIUM: Magnesium: 1.9 mg/dL (ref 1.7–2.4)

## 2024-08-30 LAB — PHOSPHORUS: Phosphorus: 2.5 mg/dL (ref 2.5–4.6)

## 2024-08-30 MED ORDER — ALTEPLASE 2 MG IJ SOLR: 2.0000 mg | Freq: Once | INTRAMUSCULAR | Status: DC | PRN

## 2024-08-30 MED ORDER — SODIUM CHLORIDE 0.9 % IV SOLN
200.0000 mg | INTRAVENOUS | Status: AC
  Administered 2024-08-30: 200 mg via INTRAVENOUS
  Filled 2024-08-30: qty 10

## 2024-08-30 MED ORDER — DARBEPOETIN ALFA 60 MCG/0.3ML IJ SOSY
60.0000 ug | PREFILLED_SYRINGE | INTRAMUSCULAR | Status: DC
  Filled 2024-08-30: qty 0.3

## 2024-08-30 MED ORDER — LIDOCAINE HCL (PF) 1 % IJ SOLN: 5.0000 mL | INTRAMUSCULAR | Status: DC | PRN

## 2024-08-30 MED ORDER — ALBUMIN HUMAN 25 % IV SOLN: 25.0000 g | Freq: Once | INTRAVENOUS | Status: AC

## 2024-08-30 MED ORDER — ANTICOAGULANT SODIUM CITRATE 4% (200MG/5ML) IV SOLN: 5.0000 mL | Status: DC | PRN

## 2024-08-30 MED ORDER — ALBUMIN HUMAN 25 % IV SOLN
25.0000 g | Freq: Once | INTRAVENOUS | Status: AC
  Administered 2024-08-30: 25 g via INTRAVENOUS

## 2024-08-30 MED ORDER — LIDOCAINE-PRILOCAINE 2.5-2.5 % EX CREA: 1.0000 | TOPICAL_CREAM | CUTANEOUS | Status: DC | PRN

## 2024-08-30 MED ORDER — HEPARIN SODIUM (PORCINE) 1000 UNIT/ML IJ SOLN
INTRAMUSCULAR | Status: AC
  Filled 2024-08-30: qty 4

## 2024-08-30 MED ORDER — HEPARIN SODIUM (PORCINE) 1000 UNIT/ML IJ SOLN
4000.0000 [IU] | Freq: Once | INTRAMUSCULAR | Status: AC
  Administered 2024-08-30: 4000 [IU]

## 2024-08-30 MED ORDER — FUROSEMIDE 10 MG/ML IJ SOLN
80.0000 mg | Freq: Once | INTRAMUSCULAR | Status: AC
  Administered 2024-08-31: 80 mg via INTRAVENOUS
  Filled 2024-08-30: qty 8

## 2024-08-30 MED ORDER — PENTAFLUOROPROP-TETRAFLUOROETH EX AERO: 1.0000 | INHALATION_SPRAY | CUTANEOUS | Status: DC | PRN

## 2024-08-30 MED ORDER — HEPARIN SODIUM (PORCINE) 1000 UNIT/ML IJ SOLN
3200.0000 [IU] | Freq: Once | INTRAMUSCULAR | Status: AC
  Administered 2024-08-30: 3200 [IU]

## 2024-08-30 MED ORDER — HEPARIN SODIUM (PORCINE) 1000 UNIT/ML DIALYSIS: 1000.0000 [IU] | INTRAMUSCULAR | Status: DC | PRN

## 2024-08-30 MED ORDER — ALBUMIN HUMAN 25 % IV SOLN
INTRAVENOUS | Status: AC
  Filled 2024-08-30: qty 100

## 2024-08-30 MED ORDER — METHOCARBAMOL 500 MG PO TABS
1000.0000 mg | ORAL_TABLET | Freq: Four times a day (QID) | ORAL | Status: DC | PRN
  Administered 2024-08-30 – 2024-09-10 (×8): 1000 mg via ORAL
  Filled 2024-08-30 (×8): qty 2

## 2024-08-30 NOTE — Progress Notes (Signed)
 Occupational Therapy Treatment Patient Details Name: Dean Gerry Sr. MRN: [id number] DOB: Mar 15, 1951 Today's Date: 08/30/2024   History of present illness 73 yo male s/p fall down 8 stairs in wheelchair. CT (+) T6 vertebral body fx, neurosurgery states no plan for surgery but needs TLSO brace. S/p R thoracentesis 11/28 yielding 500 mL bloody fluid. PMH AICD, arthritis, HLD, HFrEF, ICD, PAF on Eliquis , HTN, CAD, HLD, OSA on cpap, T2DM, CKD, HTN, laminectomy, cardiac arrest (2017).   OT comments  Pt. Seen for skilled OT treatment session with dtr. Present for duration of session.  Bed mobility completed with light cues for log roll with MIN A to eob and CGA for back into bed.  Introduction and use of A/E for LB ADLs.  Pt. Required MAX A for sequencing and use of reacher and sock aide for don/doff shoes and socks.  Pt. Will also benefit from [facility] shoe horn. (Plan to bring next session).  Max a to don brace in sitting.  Pt. Able to state 2/3 back precautions. Cont. With acute OT POC and progression of ADLs with use of A/E prn.        If plan is discharge home, recommend the following:  A little help with walking and/or transfers;A lot of help with bathing/dressing/bathroom;Assistance with cooking/housework;Direct supervision/assist for medications management;Assist for transportation;Help with stairs or ramp for entrance   Equipment Recommendations  Other (comment)    Recommendations for Other Services      Precautions / Restrictions Precautions Precautions: Fall;Back Required Braces or Orthoses: Spinal Brace Spinal Brace: Thoracolumbosacral orthotic       Mobility Bed Mobility Overal bed mobility: Needs Assistance Bed Mobility: Rolling, Sidelying to Sit, Sit to Sidelying Rolling: Contact guard assist Sidelying to sit: Min assist     Sit to sidelying: Contact guard assist General bed mobility comments: cues for sequencing in and out of the bed, more assistance required to guide  trunk upright, no physical assistance for back to bed, pt. with good scoot of hips once in sidelying to get towards center of the bed    Transfers                         Balance                                           ADL either performed or assessed with clinical judgement   ADL Overall ADL's : Needs assistance/impaired                 Upper Body Dressing : Maximal assistance;Sitting Upper Body Dressing Details (indicate cue type and reason): for TLSO Lower Body Dressing: Maximal assistance;With adaptive equipment;Cueing for sequencing;Cueing for compensatory techniques;Sitting/lateral leans Lower Body Dressing Details (indicate cue type and reason): don/doff socks and shoes with reacher and sock aide               General ADL Comments: introduction and use of A/E for LB ADLs    Extremity/Trunk Assessment              Vision       Perception     Praxis     Communication Communication Communication: No apparent difficulties   Cognition Arousal: Alert Behavior During Therapy: WFL for tasks assessed/performed Cognition: No apparent impairments  Following commands: Intact Following commands impaired: Follows one step commands with increased time      Cueing   Cueing Techniques: Verbal cues  Exercises      Shoulder Instructions       General Comments      Pertinent Vitals/ Pain       Pain Assessment Pain Assessment: No/denies pain  Home Living                                          Prior Functioning/Environment              Frequency  Min 2X/week        Progress Toward Goals  OT Goals(current goals can now be found in the care plan section)  Progress towards OT goals: Progressing toward goals     Plan      Co-evaluation                 AM-PAC OT 6 Clicks Daily Activity     Outcome Measure   Help from another person  eating meals?: None Help from another person taking care of personal grooming?: A Little Help from another person toileting, which includes using toliet, bedpan, or urinal?: A Little Help from another person bathing (including washing, rinsing, drying)?: A Lot Help from another person to put on and taking off regular upper body clothing?: A Little Help from another person to put on and taking off regular lower body clothing?: A Lot 6 Click Score: 17    End of Session Equipment Utilized During Treatment: Back brace;Other (comment) (A/E)  OT Visit Diagnosis: Unsteadiness on feet (R26.81);Other abnormalities of gait and mobility (R26.89);Muscle weakness (generalized) (M62.81);History of falling (Z91.81);Pain   Activity Tolerance Patient tolerated treatment well   Patient Left in bed;with call bell/phone within reach;with bed alarm set;with nursing/sitter in room;with family/visitor present   Nurse Communication Other (comment) (rn states ok to work with pt.)        Time: 1222-1255 OT Time Calculation (min): 33 min  Charges: OT General Charges $OT Visit: 1 Visit OT Treatments $Self Care/Home Management : 23-37 mins  Randall, COTA/L Acute Rehabilitation [phone]   CHRISTELLA Nest Lorraine-COTA/L  08/30/2024, 1:06 PM

## 2024-08-30 NOTE — Progress Notes (Signed)
   08/30/24 0225  Vitals  Temp 98.2 F (36.8 C)  Temp Source Oral  BP (!) 114/41  MAP (mmHg) (!) 62  BP Location Left Arm  BP Method Automatic  Patient Position (if appropriate) Lying  ECG Heart Rate 74  Resp 19  Oxygen Therapy  SpO2 98 %  O2 Device Nasal Cannula  O2 Flow Rate (L/min) 2 L/min  During Treatment Monitoring  Blood Flow Rate (mL/min) 0 mL/min  Arterial Pressure (mmHg) 14.95 mmHg  Venous Pressure (mmHg) 91.91 mmHg  TMP (mmHg) 24.44 mmHg  Ultrafiltration Rate (mL/min) 650 mL/min  Dialysate Flow Rate (mL/min) 299 ml/min  Dialysate Potassium Concentration 2  Dialysate Calcium  Concentration 2.5  Duration of HD Treatment -hour(s) 2.5 hour(s)  Cumulative Fluid Removed (mL) per Treatment  1000.07  HD Safety Checks Performed Yes  Intra-Hemodialysis Comments Tx completed  Post Treatment  Dialyzer Clearance Lightly streaked  Liters Processed 37.5  Fluid Removed (mL) 1000 mL  Tolerated HD Treatment Yes  Hemodialysis Catheter Right Internal jugular Double lumen Permanent (Tunneled)  Placement Date/Time: 08/28/24 1526   Placed prior to admission: No  Serial / Lot #: [id number]  Expiration Date: 09/27/28  Time Out: Correct patient;Correct site;Correct procedure  Maximum sterile barrier precautions: Hand hygiene;Large sterile sheet;...  Site Condition No complications  Blue Lumen Status Flushed;Heparin  locked  Red Lumen Status Flushed;Heparin  locked  Catheter fill solution Heparin  1000 units/ml  Catheter fill volume (Arterial) 1.6 cc  Catheter fill volume (Venous) 1.6  Dressing Type Transparent  Dressing Status Antimicrobial disc/dressing in place  Interventions New dressing  Drainage Description None  Dressing Change Due 09/05/24  Post treatment catheter status Capped and Clamped

## 2024-08-30 NOTE — Progress Notes (Signed)
 Subjective: S/p IR thora yesterday. Denies SOB on room air. Reports some back pain with repositioning in bed.   Objective: Vital signs in last 24 hours: Temp:  [97.6 F (36.4 C)-98.6 F (37 C)] 97.7 F (36.5 C) (12/06 0830) Pulse Rate:  [70-72] 72 (12/06 0305) Resp:  [16-30] 16 (12/06 0830) BP: (101-127)/(37-65) 120/49 (12/06 0830) SpO2:  [94 %-100 %] 94 % (12/06 0830) Weight:  [98 kg] 98 kg (12/06 0700) Last BM Date : 08/26/24  Intake/Output from previous day: 12/05 0701 - 12/06 0700 In: 200 [I.V.:10; IV Piggyback:190] Out: 1300 [Urine:300] Intake/Output this shift: Total I/O In: 20 [I.V.:20] Out: -   PE: Gen:  Alert, NAD, pleasant Card:  Reg rate Pulm: normal effort on room air  Ext:  Noted bilateral lower extremity edema  Lab Results:  Recent Labs    08/28/24 0935 08/29/24 1741  WBC 5.9  --   HGB 7.1* 7.5*  HCT 21.7* 22.0*  PLT 176  --    BMET Recent Labs    08/29/24 0612 08/29/24 1741 08/30/24 0555  NA 132* 131* 135  K 4.6 4.7 3.7  CL 96*  --  98  CO2 25  --  25  GLUCOSE 106*  --  125*  BUN 65*  --  41*  CREATININE 3.52*  --  2.54*  CALCIUM  9.6  --  9.1   PT/INR No results for input(s): LABPROT, INR in the last 72 hours. CMP     Component Value Date/Time   NA 135 08/30/2024 0555   NA 140 06/04/2024 1445   K 3.7 08/30/2024 0555   CL 98 08/30/2024 0555   CO2 25 08/30/2024 0555   GLUCOSE 125 (H) 08/30/2024 0555   BUN 41 (H) 08/30/2024 0555   BUN 23 06/04/2024 1445   CREATININE 2.54 (H) 08/30/2024 0555   CALCIUM  9.1 08/30/2024 0555   PROT 6.7 08/25/2024 1351   PROT 6.6 05/16/2024 1308   ALBUMIN  3.1 (L) 08/25/2024 1351   ALBUMIN  3.5 (L) 05/16/2024 1308   AST 21 08/25/2024 1351   ALT 15 08/25/2024 1351   ALKPHOS 71 08/25/2024 1351   BILITOT 1.4 (H) 08/25/2024 1351   BILITOT 2.0 (H) 05/16/2024 1308   GFRNONAA 26 (L) 08/30/2024 0555   GFRAA 59 (L) 11/02/2020 1457   Lipase     Component Value Date/Time   LIPASE 28  04/16/2024 2252    Studies/Results: DG CHEST PORT 1 VIEW Result Date: 08/30/2024 EXAM: 1 VIEW(S) XRAY OF THE CHEST 08/30/2024 06:48:31 AM COMPARISON: 08/29/2024 CLINICAL HISTORY: Pleural effusion FINDINGS: LINES, TUBES AND DEVICES: Stable right IJ CVC and right PICC in place. Left chest AICD stable in place. LUNGS AND PLEURA: Small to moderate right and small left pleural effusions, similar. Bibasilar atelectasis. Stable mild diffuse interstitial prominence consistent with pulmonary edema. No focal pulmonary opacity. No pneumothorax. HEART AND MEDIASTINUM: Cardiomegaly unchanged. BONES AND SOFT TISSUES: No acute osseous abnormality. IMPRESSION: 1. Small to moderate right and small left pleural effusions, similar to prior study. 2. Stable mild diffuse interstitial prominence consistent with pulmonary edema. Electronically signed by: Waddell Calk MD 08/30/2024 07:25 AM EST RP Workstation: [id number]   IR THORACENTESIS ASP PLEURAL SPACE W/IMG GUIDE Result Date: 08/29/2024 INDICATION: 73 year old male with hemothorax after recent fall. EXAM: ULTRASOUND GUIDED RIGHT THORACENTESIS MEDICATIONS: 10 mL% lidocaine  COMPLICATIONS: None immediate. PROCEDURE: An ultrasound guided thoracentesis was thoroughly discussed with the patient and questions answered. The benefits, risks, alternatives and complications were also discussed.  The patient understands and wishes to proceed with the procedure. Written consent was obtained. Ultrasound was performed to localize and mark an adequate pocket of fluid in the right chest. The area was then prepped and draped in the normal sterile fashion. 1% Lidocaine  was used for local anesthesia. Under ultrasound guidance a 6 Fr Safe-T-Centesis catheter was introduced. Thoracentesis was performed. The catheter was removed and a dressing applied. FINDINGS: A total of approximately 1.5 liters of bloody fluid was removed. Samples were sent to the laboratory as requested by the clinical team.  IMPRESSION: Successful ultrasound guided right thoracentesis yielding 1.5 L of pleural fluid. Performed by: Kacie Matthews PA-C Electronically Signed   By: Cordella Banner   On: 08/29/2024 15:58   DG Chest 1 View Result Date: 08/29/2024 EXAM: 1 VIEW(S) XRAY OF THE CHEST 08/29/2024 12:29:00 PM COMPARISON: Yesterday's study (08/28/2024). CLINICAL HISTORY: [id number] S/P thoracentesis [id number] S/P thoracentesis. FINDINGS: LINES, TUBES AND DEVICES: Interval placement of right ankle jugular catheter with distal tip inspected position is best seen. Right-sided PICC line is unchanged. LUNGS AND PLEURA: Small pleural effusions are noted. Minimal right basilar atelectasis. No focal pulmonary opacity. No pneumothorax. HEART AND MEDIASTINUM: Stable cardiomegaly. Left-sided defibrillator is unchanged. BONES AND SOFT TISSUES: No acute osseous abnormality. IMPRESSION: 1. Small pleural effusions. 2. Stable cardiomegaly with unchanged left-sided defibrillator. 3. Right internal jugular catheter placed with distal tip in appropriate position. Unchanged right-sided PICC line. 4. Minimal right basilar atelectasis. Electronically signed by: Lynwood Seip MD 08/29/2024 12:56 PM EST RP Workstation: [id number]   IR TUNNELED CENTRAL VENOUS CATH [name] W IMG Result Date: 08/28/2024 INDICATION: ESRD requiring HD initiation. EXAM: TUNNELED CENTRAL VENOUS HEMODIALYSIS CATHETER PLACEMENT WITH ULTRASOUND AND FLUOROSCOPIC GUIDANCE MEDICATIONS: Ancef  2 gm IV. The antibiotic was given in an appropriate time interval prior to skin puncture. ANESTHESIA/SEDATION: Local anesthetic and single agent sedation was employed during this procedure. A total of Versed  1 mg was administered intravenously. The patient's level of consciousness and vital signs were monitored continuously by radiology nursing throughout the procedure under my direct supervision. FLUOROSCOPY: Radiation Exposure Index and estimated peak skin dose (PSD); Reference air kerma (RAK), 7.3 mGy.  COMPLICATIONS: None immediate. PROCEDURE: Informed written consent was obtained from the patient and/or patient's representative after a discussion of the risks, benefits, and alternatives to treatment. Questions regarding the procedure were encouraged and answered. The RIGHT neck and chest were prepped with chlorhexidine  in a sterile fashion, and a sterile drape was applied covering the operative field. Maximum barrier sterile technique with sterile gowns and gloves were used for the procedure. A timeout was performed prior to the initiation of the procedure. After creating a small venotomy incision, a micropuncture kit was utilized to access the internal jugular vein. Real-time ultrasound guidance was utilized for vascular access including the acquisition of a permanent ultrasound image documenting patency of the accessed vessel. The microwire was utilized to measure appropriate catheter length. A stiff Glidewire was advanced to the level of the IVC and the micropuncture sheath was exchanged for a peel-away sheath. A palindrome tunneled hemodialysis catheter measuring 19 cm from tip to cuff was tunneled in a retrograde fashion from the anterior chest wall to the venotomy incision. The catheter was then placed through the peel-away sheath with tips ultimately positioned within the superior aspect of the right atrium. Final catheter positioning was confirmed and documented with a spot radiographic image. The catheter aspirates and flushes normally. The catheter was flushed with appropriate volume heparin  dwells. The catheter exit site was  secured with a 2-0 Ethilon retention suture. The venotomy incision was closed with Dermabond. Dressings were applied. The patient tolerated the procedure well without immediate post procedural complication. IMPRESSION: Successful placement of 19 cm tip to cuff tunneled hemodialysis catheter via the RIGHT internal jugular vein. The tip of the catheter is positioned at the superior  cavo-atrial junction. The catheter is ready for immediate use. Thom Hall, MD [facility] Radiology Electronically Signed   By: Thom Hall M.D.   On: 08/28/2024 16:41    Anti-infectives: Anti-infectives (From admission, onward)    Start     Dose/Rate Route Frequency Ordered Stop   08/28/24 1345  ceFAZolin  (ANCEF ) IVPB 2g/100 mL premix        2 g 200 mL/hr over 30 Minutes Intravenous To Radiology 08/28/24 1247 08/28/24 1545        Assessment/Plan Fall from wheelchair T6 vb fx - Per NSGY, TLSO brace, outpatient follow up in 2 weeks. Moderate right pleural effusion - Did have small bilateral pleural effusions on CT in mid October.  - S/p thoracentesis 11/28 with bloody output. Cx NGTD - HF team following. Defer diuretics to their team with AKI. S/p lasix  12/4.  - CXR 12/3 with worsening pleural effusion. Unchanged large right pleural effusion 12/4.  - IR thora yesterday with 1.5 L bloody fluid - CXR this AM with small to moderate R pleural effusion but oxygenating well on room air  - Cont aggressive pulmonary toilet, mobilize as able  Further management of likely acute on chronic effusion per primary service. Trauma will sign off at this time, please call if we can be of further assistance.   FEN: Renal VTE: SCD's, SQH, hold DOAC for now Foley: none ID: none Dispo: Progressive care   - Per primary -  Chronic heart failure (ischemic cardiomyopathy) - HF following Hx VT/VF arrest and AICD Hx hypertension Hx OSA on CPAP Hx A-fib on Eliquis  Hx type 2 diabetes  AKI - Per Nephrology. Getting HD Anemia - Hgb 7.1 12/4. Recommend repeat labs for monitoring.  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  Moderate MDM  LOS: 8 days    Burnard JONELLE Louder, [facility] Surgery 08/30/2024, 10:09 AM Please see Amion for pager number during day hours 7:00am-4:30pm

## 2024-08-30 NOTE — Progress Notes (Signed)
 Patient is off unit to dialysis. CVP monitoring paused. Per dialysis RN, they couldn't monitor it with their equipment. MD notified. Will resume CVP monitoring when patient is back on the floor.

## 2024-08-30 NOTE — Progress Notes (Signed)
 PROGRESS NOTE    Dean Croft Sr.  FMW:[id number] DOB: Jan 27, 1974 DOA: 08/22/2024 PCP: [name], MD   Brief Narrative:  This 73M h/o CAD, PAF, ICM, cardiac arrest s/p AICD, HFrEF (EF 35-40% in 04/2024), HTN, HLD, DM2, OSA, and last admission in 07/16/24 for HFrEF exacerbation who p/w back pain s/p GLF from wheelchair and found to have AKI and acute T6 fracture. Recent OP Cards tele appointment note on 11/20 suggested multiple missed appointments and recent increase in edema and uptitration of torsemide  to 60 mg daily. Hospital stay complicated with HF With worsening renal function. Cardiology and renal following. HD line was placed on 12/4 and HD initiated. Underwent thoracentesis on 11/28 and again on 12/5 for worsening right sided pleural effusion. Developed anemia and started on IV iron .  Assessment & Plan:   Principal Problem:   Multiple traumatic injuries Active Problems:   Cardiorenal syndrome with renal failure   Cardiorenal syndrome   Cardiorenal syndrome with renal failure / Low output biventricular heart failure - Milrinone  was initially started but held due to hypotension.  - Concern that he may be intravascularly dry and received albumin  and small IVF boluses. 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 - Now s/p HD catheter placement and first HD session on 08/28/24 - second HD session completed 12/5 - still making some urine, 100 ml and unmeasured UOP, needs strict I/Os .   Acute on chronic anemia - most likely from his CKD along with acute illness. - has iron  deficiency - Continue IV iron . - check CBC, if Hb<7, can get PRBC with HD .   Acute T6 fracture, physical deconditioning: - Neurosurgery consulted; apprec eval/recs (no surgery indicated; TLSO and OP f/u w/ Dr. [name]) -TLSO brace for now. -PT/OT consulted   R pleural effusion: RML/RLL consolidation - s/p IR guided thoracentesis on 11/28 with removal of  500 mL of bloody fluid  - repeat chest x ray showed increased right sided pleural effusion - repeat thoracentesis on 12/5, with removal of 1.5L of bloody fluid  - on 3L [place], continue to monitor respiratory status    Hyperkalemia - due to AKI on CKD. - resolved with HD. - monitor daily labs    Liver enhancement on imaging: -OP f/u with Cards/GI to discuss amiodarone  toxicity.   PAF: -PTA amiodarone  200mg  daily and Eliquis  5mg  BID (currently held)   DVT prophylaxis: Heparin   Code Status: DNR Family Communication:No family at bed side. Disposition Plan:    Status is: Inpatient Remains inpatient appropriate because: Severity of illness.   Consultants:  Cardiology Nephrology Neurosurgery  Procedures:  Antimicrobials:  Anti-infectives (From admission, onward)    Start     Dose/Rate Route Frequency Ordered Stop   08/28/24 1345  ceFAZolin  (ANCEF ) IVPB 2g/100 mL premix        2 g 200 mL/hr over 30 Minutes Intravenous To Radiology 08/28/24 1247 08/28/24 1545      Subjective: Patient was seen and examined at bedside.  Overnight events noted. Patient reports feeling much better,  still reports having pain in the back,  tried TLSO brace yesterday.  Objective: Vitals:   08/30/24 0305 08/30/24 0700 08/30/24 0830 08/30/24 1137  BP: (!) 121/37  (!) 120/49 (!) 111/41  Pulse: 72   77  Resp: 16  16 14   Temp: 97.6 F (36.4 C)  97.7 F (36.5 C) 98.5 F (36.9 C)  TempSrc: Oral  Oral Oral  SpO2:  99%  94% 92%  Weight:  98 kg    Height:        Intake/Output Summary (Last 24 hours) at 08/30/2024 1224 Last data filed at 08/30/2024 0944 Gross per 24 hour  Intake 220.03 ml  Output 1300 ml  Net -1079.97 ml   Filed Weights   08/27/24 0728 08/28/24 0706 08/30/24 0700  Weight: 97 kg 94.6 kg 98 kg    Examination:  General exam: Appears calm and comfortable, not in any acute distress.  Deconditioned Respiratory system: CTA bilaterally . Respiratory effort normal.  RR  16 Cardiovascular system: S1 & S2 heard, RRR. No JVD, murmurs, rubs, gallops or clicks.  Gastrointestinal system: Abdomen is non distended, soft and non tender.  Normal bowel sounds heard. Central nervous system: Alert and oriented x 3. No focal neurological deficits. Extremities: Edema ++, no cyanosis, no clubbing. Skin: No rashes, lesions or ulcers Psychiatry: Judgement and insight appear normal. Mood & affect appropriate.    Data Reviewed: I have personally reviewed following labs and imaging studies  CBC: Recent Labs  Lab 08/24/24 0318 08/25/24 0926 08/26/24 0345 08/27/24 0431 08/28/24 0935 08/29/24 1741  WBC 6.2 6.7 7.7 8.8 5.9  --   HGB 8.2* 8.8* 8.4* 7.4* 7.1* 7.5*  HCT 25.5* 28.4* 26.2* 23.5* 21.7* 22.0*  MCV 92.1 93.7 91.9 92.2 92.7  --   PLT 134* 190 200 184 176  --    Basic Metabolic Panel: Recent Labs  Lab 08/26/24 0345 08/26/24 1332 08/27/24 0431 08/28/24 0617 08/29/24 0612 08/29/24 1741 08/30/24 0555  NA 131*  --  131* 130* 132* 131* 135  K 5.1  --  5.7* 5.5* 4.6 4.7 3.7  CL 101  --  102 100 96*  --  98  CO2 20*  --  21* 21* 25  --  25  GLUCOSE 113*  --  104* 94 106*  --  125*  BUN 81*  --  84* 91* 65*  --  41*  CREATININE 4.32* 5.01* 4.89* 5.07* 3.52*  --  2.54*  CALCIUM  9.6  --  9.7 9.5 9.6  --  9.1  MG 1.8  --  1.7 1.9 1.9  --  1.9  PHOS  --   --   --   --   --   --  2.5   GFR: Estimated Creatinine Clearance: 28 mL/min (A) (by C-G formula based on SCr of 2.54 mg/dL (H)). Liver Function Tests: Recent Labs  Lab 08/25/24 1351  AST 21  ALT 15  ALKPHOS 71  BILITOT 1.4*  PROT 6.7  ALBUMIN  3.1*   No results for input(s): LIPASE, AMYLASE in the last 168 hours. No results for input(s): AMMONIA in the last 168 hours. Coagulation Profile: No results for input(s): INR, PROTIME in the last 168 hours. Cardiac Enzymes: No results for input(s): CKTOTAL, CKMB, CKMBINDEX, TROPONINI in the last 168 hours. BNP (last 3 results) No  results for input(s): PROBNP in the last 8760 hours. HbA1C: No results for input(s): HGBA1C in the last 72 hours. CBG: No results for input(s): GLUCAP in the last 168 hours. Lipid Profile: No results for input(s): CHOL, HDL, LDLCALC, TRIG, CHOLHDL, LDLDIRECT in the last 72 hours. Thyroid Function Tests: No results for input(s): TSH, T4TOTAL, FREET4, T3FREE, THYROIDAB in the last 72 hours. Anemia Panel: Recent Labs    08/28/24 0935  FERRITIN 95  TIBC 330  IRON  22*   Sepsis Labs: Recent Labs  Lab 08/25/24 1351 08/25/24 1607 08/26/24 1332 08/27/24 1855  LATICACIDVEN 1.8 1.5 1.6 0.8    Recent Results (from the past 240 hours)  Body fluid culture w Gram Stain     Status: None   Collection Time: 08/22/24 12:22 PM   Specimen: Lung, Right; Pleural Fluid  Result Value Ref Range Status   Specimen Description PLEURAL  Final   Special Requests RIGHT LUNG  Final   Gram Stain   Final    RARE WBC PRESENT, PREDOMINANTLY PMN NO ORGANISMS SEEN    Culture   Final    NO GROWTH 3 DAYS Performed at [facility], [address], [place], [place] [address]    Report Status 08/25/2024 FINAL  Final    Radiology Studies: DG CHEST PORT 1 VIEW Result Date: 08/30/2024 EXAM: 1 VIEW(S) XRAY OF THE CHEST 08/30/2024 06:48:31 AM COMPARISON: 08/29/2024 CLINICAL HISTORY: Pleural effusion FINDINGS: LINES, TUBES AND DEVICES: Stable right IJ CVC and right PICC in place. Left chest AICD stable in place. LUNGS AND PLEURA: Small to moderate right and small left pleural effusions, similar. Bibasilar atelectasis. Stable mild diffuse interstitial prominence consistent with pulmonary edema. No focal pulmonary opacity. No pneumothorax. HEART AND MEDIASTINUM: Cardiomegaly unchanged. BONES AND SOFT TISSUES: No acute osseous abnormality. IMPRESSION: 1. Small to moderate right and small left pleural effusions, similar to prior study. 2. Stable mild diffuse interstitial prominence  consistent with pulmonary edema. Electronically signed by: Waddell Calk MD 08/30/2024 07:25 AM EST RP Workstation: [id number]   IR THORACENTESIS ASP PLEURAL SPACE W/IMG GUIDE Result Date: 08/29/2024 INDICATION: 73 year old male with hemothorax after recent fall. EXAM: ULTRASOUND GUIDED RIGHT THORACENTESIS MEDICATIONS: 10 mL% lidocaine  COMPLICATIONS: None immediate. PROCEDURE: An ultrasound guided thoracentesis was thoroughly discussed with the patient and questions answered. The benefits, risks, alternatives and complications were also discussed. The patient understands and wishes to proceed with the procedure. Written consent was obtained. Ultrasound was performed to localize and mark an adequate pocket of fluid in the right chest. The area was then prepped and draped in the normal sterile fashion. 1% Lidocaine  was used for local anesthesia. Under ultrasound guidance a 6 Fr Safe-T-Centesis catheter was introduced. Thoracentesis was performed. The catheter was removed and a dressing applied. FINDINGS: A total of approximately 1.5 liters of bloody fluid was removed. Samples were sent to the laboratory as requested by the clinical team. IMPRESSION: Successful ultrasound guided right thoracentesis yielding 1.5 L of pleural fluid. Performed by: Kacie Matthews PA-C Electronically Signed   By: Cordella Banner   On: 08/29/2024 15:58   DG Chest 1 View Result Date: 08/29/2024 EXAM: 1 VIEW(S) XRAY OF THE CHEST 08/29/2024 12:29:00 PM COMPARISON: Yesterday's study (08/28/2024). CLINICAL HISTORY: [id number] S/P thoracentesis [id number] S/P thoracentesis. FINDINGS: LINES, TUBES AND DEVICES: Interval placement of right ankle jugular catheter with distal tip inspected position is best seen. Right-sided PICC line is unchanged. LUNGS AND PLEURA: Small pleural effusions are noted. Minimal right basilar atelectasis. No focal pulmonary opacity. No pneumothorax. HEART AND MEDIASTINUM: Stable cardiomegaly. Left-sided defibrillator is  unchanged. BONES AND SOFT TISSUES: No acute osseous abnormality. IMPRESSION: 1. Small pleural effusions. 2. Stable cardiomegaly with unchanged left-sided defibrillator. 3. Right internal jugular catheter placed with distal tip in appropriate position. Unchanged right-sided PICC line. 4. Minimal right basilar atelectasis. Electronically signed by: Lynwood Seip MD 08/29/2024 12:56 PM EST RP Workstation: [id number]   IR TUNNELED CENTRAL VENOUS CATH [facility] W IMG Result Date: 08/28/2024 INDICATION: ESRD requiring HD initiation. EXAM: TUNNELED CENTRAL VENOUS HEMODIALYSIS CATHETER PLACEMENT WITH ULTRASOUND AND FLUOROSCOPIC GUIDANCE MEDICATIONS: Ancef  2  gm IV. The antibiotic was given in an appropriate time interval prior to skin puncture. ANESTHESIA/SEDATION: Local anesthetic and single agent sedation was employed during this procedure. A total of Versed  1 mg was administered intravenously. The patient's level of consciousness and vital signs were monitored continuously by radiology nursing throughout the procedure under my direct supervision. FLUOROSCOPY: Radiation Exposure Index and estimated peak skin dose (PSD); Reference air kerma (RAK), 7.3 mGy. COMPLICATIONS: None immediate. PROCEDURE: Informed written consent was obtained from the patient and/or patient's representative after a discussion of the risks, benefits, and alternatives to treatment. Questions regarding the procedure were encouraged and answered. The RIGHT neck and chest were prepped with chlorhexidine  in a sterile fashion, and a sterile drape was applied covering the operative field. Maximum barrier sterile technique with sterile gowns and gloves were used for the procedure. A timeout was performed prior to the initiation of the procedure. After creating a small venotomy incision, a micropuncture kit was utilized to access the internal jugular vein. Real-time ultrasound guidance was utilized for vascular access including the acquisition of a permanent  ultrasound image documenting patency of the accessed vessel. The microwire was utilized to measure appropriate catheter length. A stiff Glidewire was advanced to the level of the IVC and the micropuncture sheath was exchanged for a peel-away sheath. A palindrome tunneled hemodialysis catheter measuring 19 cm from tip to cuff was tunneled in a retrograde fashion from the anterior chest wall to the venotomy incision. The catheter was then placed through the peel-away sheath with tips ultimately positioned within the superior aspect of the right atrium. Final catheter positioning was confirmed and documented with a spot radiographic image. The catheter aspirates and flushes normally. The catheter was flushed with appropriate volume heparin  dwells. The catheter exit site was secured with a 2-0 Ethilon retention suture. The venotomy incision was closed with Dermabond. Dressings were applied. The patient tolerated the procedure well without immediate post procedural complication. IMPRESSION: Successful placement of 19 cm tip to cuff tunneled hemodialysis catheter via the RIGHT internal jugular vein. The tip of the catheter is positioned at the superior cavo-atrial junction. The catheter is ready for immediate use. Thom Hall, MD [facility] Radiology Electronically Signed   By: Thom Hall M.D.   On: 08/28/2024 16:41   Scheduled Meds:  acetaminophen   650 mg Oral Q6H   amiodarone   200 mg Oral Daily   atorvastatin   40 mg Oral Daily   Chlorhexidine  Gluconate Cloth  6 each Topical Daily   Chlorhexidine  Gluconate Cloth  6 each Topical Q0600   Chlorhexidine  Gluconate Cloth  6 each Topical Q0600   collagenase    Topical Daily   [START ON 09/02/2024] darbepoetin (ARANESP ) injection - DIALYSIS  60 mcg Subcutaneous Q Tue-1800   [START ON 08/31/2024] furosemide   80 mg Intravenous Once   heparin  injection (subcutaneous)  5,000 Units Subcutaneous Q8H   heparin  sodium  (porcine)  10,000 Units Intracatheter Once   hydrocerin   Topical BID   lidocaine -EPINEPHrine   20 mL Intradermal Once   lidocaine -EPINEPHrine   20 mL Intradermal Once   polyethylene glycol  17 g Oral Daily   sodium chloride  flush  10-40 mL Intracatheter Q12H   Continuous Infusions:  anticoagulant sodium citrate      milrinone  Stopped (08/26/24 1316)     LOS: 8 days    Time spent: 50 Mins    Darcel Dawley, MD [facility]   If 7PM-7AM, please contact night-coverage

## 2024-08-30 NOTE — Progress Notes (Signed)
 DAILY PROGRESS NOTE   Patient Name: Dean Toledo Sr. Date of Encounter: 08/30/2024 Cardiologist: Vinie JAYSON Maxcy, MD  Chief Complaint   Getting more dialysis today. Paying bills on phone with his daughter this am  Patient Profile   Dean Bourbeau Sr. is a 73 y.o. 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 Juvenal.    Objective   Vitals:   08/30/24 0225 08/30/24 0305 08/30/24 0700 08/30/24 0830  BP: (!) 114/41 (!) 121/37  (!) 120/49  Pulse:  72    Resp: 19 16  16   Temp: 98.2 F (36.8 C) 97.6 F (36.4 C)  97.7 F (36.5 C)  TempSrc: Oral Oral  Oral  SpO2: 98% 99%  94%  Weight:   98 kg   Height:        Intake/Output Summary (Last 24 hours) at 08/30/2024 1111 Last data filed at 08/30/2024 0944 Gross per 24 hour  Intake 220.03 ml  Output 1300 ml  Net -1079.97 ml   Filed Weights   08/27/24 0728 08/28/24 0706 08/30/24 0700  Weight: 97 kg 94.6 kg 98 kg    Physical Exam   Chronically ill obese black male Dialysis catheter right subclavian No murmur Abdomen benign Lungs decreased BS right mid/base Plus 3 edema persists with wrap on right leg   Inpatient Medications    Scheduled Meds:  acetaminophen   650 mg Oral Q6H   amiodarone   200 mg Oral Daily   atorvastatin   40 mg Oral Daily   Chlorhexidine  Gluconate Cloth  6 each Topical Daily   Chlorhexidine  Gluconate Cloth  6 each Topical Q0600   Chlorhexidine  Gluconate Cloth  6 each Topical Q0600   collagenase    Topical Daily   [START ON 09/02/2024] darbepoetin (ARANESP ) injection - DIALYSIS  60 mcg Subcutaneous Q Tue-1800   [START ON 08/31/2024] furosemide   80 mg Intravenous Once   heparin  injection (subcutaneous)  5,000 Units Subcutaneous Q8H   heparin  sodium (porcine)  10,000 Units Intracatheter Once    hydrocerin   Topical BID   lidocaine -EPINEPHrine   20 mL Intradermal Once   lidocaine -EPINEPHrine   20 mL Intradermal Once   polyethylene glycol  17 g Oral Daily   sodium chloride  flush  10-40 mL Intracatheter Q12H    Continuous Infusions:  anticoagulant sodium citrate      iron  sucrose     milrinone  Stopped (08/26/24 1316)    PRN Meds: alteplase , anticoagulant sodium citrate , heparin , HYDROmorphone  (DILAUDID ) injection, lidocaine  (PF), lidocaine -prilocaine , methocarbamol , oxyCODONE , pentafluoroprop-tetrafluoroeth, sodium chloride  flush   Labs   Results for orders placed or performed during the hospital encounter of 08/22/24 (from the past 48 hours)  Cooxemetry Panel (carboxy, met, total hgb, O2 sat)     Status: Abnormal   Collection Time: 08/29/24  5:55 AM  Result Value Ref Range   Total hemoglobin 7.5 (L) 12.0 - 16.0 g/dL   O2 Saturation 09.7 %   Carboxyhemoglobin 3.9 (H) 0.5 - 1.5 %   Methemoglobin 2.2 (H) 0.0 - 1.5 %    Comment: Performed at [facility], [address], [place], [place] [address]  Basic metabolic panel     Status: Abnormal   Collection Time: 08/29/24  6:12 AM  Result Value Ref Range   Sodium 132 (L) 135 - 145 mmol/L   Potassium 4.6  3.5 - 5.1 mmol/L   Chloride 96 (L) 98 - 111 mmol/L   CO2 25 22 - 32 mmol/L   Glucose, Bld 106 (H) 70 - 99 mg/dL    Comment: Glucose reference range applies only to samples taken after fasting for at least 8 hours.   BUN 65 (H) 8 - 23 mg/dL   Creatinine, Ser 6.47 (H) 0.61 - 1.24 mg/dL   Calcium  9.6 8.9 - 10.3 mg/dL   GFR, Estimated 17 (L) >60 mL/min    Comment: (NOTE) Calculated using the CKD-EPI Creatinine Equation (2021)    Anion gap 11 5 - 15    Comment: Performed at [facility], [address] [place], [place] [address]  Magnesium      Status: None   Collection Time: 08/29/24  6:12 AM  Result Value Ref Range   Magnesium  1.9 1.7 - 2.4 mg/dL    Comment: Performed at [facility], [address],  [place], [place] [address]  I-STAT 7, (LYTES, BLD GAS, ICA, H+H)     Status: Abnormal   Collection Time: 08/29/24  5:41 PM  Result Value Ref Range   pH, Arterial 7.407 7.35 - 7.45   pCO2 arterial 34.4 32 - 48 mmHg   pO2, Arterial 84 83 - 108 mmHg   Bicarbonate 21.7 20.0 - 28.0 mmol/L   TCO2 23 22 - 32 mmol/L   O2 Saturation 96 %   Acid-base deficit 3.0 (H) 0.0 - 2.0 mmol/L   Sodium 131 (L) 135 - 145 mmol/L   Potassium 4.7 3.5 - 5.1 mmol/L   Calcium , Ion 1.23 1.15 - 1.40 mmol/L   HCT 22.0 (L) 39.0 - 52.0 %   Hemoglobin 7.5 (L) 13.0 - 17.0 g/dL   Sample type ARTERIAL   Basic metabolic panel     Status: Abnormal   Collection Time: 08/30/24  5:55 AM  Result Value Ref Range   Sodium 135 135 - 145 mmol/L   Potassium 3.7 3.5 - 5.1 mmol/L   Chloride 98 98 - 111 mmol/L   CO2 25 22 - 32 mmol/L   Glucose, Bld 125 (H) 70 - 99 mg/dL    Comment: Glucose reference range applies only to samples taken after fasting for at least 8 hours.   BUN 41 (H) 8 - 23 mg/dL   Creatinine, Ser 7.45 (H) 0.61 - 1.24 mg/dL   Calcium  9.1 8.9 - 10.3 mg/dL   GFR, Estimated 26 (L) >60 mL/min    Comment: (NOTE) Calculated using the CKD-EPI Creatinine Equation (2021)    Anion gap 12 5 - 15    Comment: Performed at [facility], [address], [place], [place] [address]  Magnesium      Status: None   Collection Time: 08/30/24  5:55 AM  Result Value Ref Range   Magnesium  1.9 1.7 - 2.4 mg/dL    Comment: Performed at [facility], [address], [place], [place] [address]  Phosphorus     Status: None   Collection Time: 08/30/24  5:55 AM  Result Value Ref Range   Phosphorus 2.5 2.5 - 4.6 mg/dL    Comment: Performed at [facility], [address], [place], [place] [address]    ECG   Paced rhythm - Personally Reviewed  Telemetry   Paced rhythm - Personally Reviewed  Radiology    DG CHEST PORT 1 VIEW Result Date: 08/30/2024 EXAM: 1 VIEW(S) XRAY OF THE CHEST 08/30/2024 06:48:31 AM COMPARISON:  08/29/2024  CLINICAL HISTORY: Pleural effusion FINDINGS: LINES, TUBES AND DEVICES: Stable right IJ CVC and right PICC in place. Left chest AICD stable in place. LUNGS AND PLEURA: Small to moderate right and small left pleural effusions, similar. Bibasilar atelectasis. Stable mild diffuse interstitial prominence consistent with pulmonary edema. No focal pulmonary opacity. No pneumothorax. HEART AND MEDIASTINUM: Cardiomegaly unchanged. BONES AND SOFT TISSUES: No acute osseous abnormality. IMPRESSION: 1. Small to moderate right and small left pleural effusions, similar to prior study. 2. Stable mild diffuse interstitial prominence consistent with pulmonary edema. Electronically signed by: Waddell Calk MD 08/30/2024 07:25 AM EST RP Workstation: [id number]   IR THORACENTESIS ASP PLEURAL SPACE W/IMG GUIDE Result Date: 08/29/2024 INDICATION: 73 year old male with hemothorax after recent fall. EXAM: ULTRASOUND GUIDED RIGHT THORACENTESIS MEDICATIONS: 10 mL% lidocaine  COMPLICATIONS: None immediate. PROCEDURE: An ultrasound guided thoracentesis was thoroughly discussed with the patient and questions answered. The benefits, risks, alternatives and complications were also discussed. The patient understands and wishes to proceed with the procedure. Written consent was obtained. Ultrasound was performed to localize and mark an adequate pocket of fluid in the right chest. The area was then prepped and draped in the normal sterile fashion. 1% Lidocaine  was used for local anesthesia. Under ultrasound guidance a 6 Fr Safe-T-Centesis catheter was introduced. Thoracentesis was performed. The catheter was removed and a dressing applied. FINDINGS: A total of approximately 1.5 liters of bloody fluid was removed. Samples were sent to the laboratory as requested by the clinical team. IMPRESSION: Successful ultrasound guided right thoracentesis yielding 1.5 L of pleural fluid. Performed by: Kacie Matthews PA-C Electronically Signed   By:  Cordella Banner   On: 08/29/2024 15:58   DG Chest 1 View Result Date: 08/29/2024 EXAM: 1 VIEW(S) XRAY OF THE CHEST 08/29/2024 12:29:00 PM COMPARISON: Yesterday's study (08/28/2024). CLINICAL HISTORY: 758136 S/P thoracentesis [id number] S/P thoracentesis. FINDINGS: LINES, TUBES AND DEVICES: Interval placement of right ankle jugular catheter with distal tip inspected position is best seen. Right-sided PICC line is unchanged. LUNGS AND PLEURA: Small pleural effusions are noted. Minimal right basilar atelectasis. No focal pulmonary opacity. No pneumothorax. HEART AND MEDIASTINUM: Stable cardiomegaly. Left-sided defibrillator is unchanged. BONES AND SOFT TISSUES: No acute osseous abnormality. IMPRESSION: 1. Small pleural effusions. 2. Stable cardiomegaly with unchanged left-sided defibrillator. 3. Right internal jugular catheter placed with distal tip in appropriate position. Unchanged right-sided PICC line. 4. Minimal right basilar atelectasis. Electronically signed by: Lynwood Seip MD 08/29/2024 12:56 PM EST RP Workstation: [id number]   IR TUNNELED CENTRAL VENOUS CATH [facility] W IMG Result Date: 08/28/2024 INDICATION: ESRD requiring HD initiation. EXAM: TUNNELED CENTRAL VENOUS HEMODIALYSIS CATHETER PLACEMENT WITH ULTRASOUND AND FLUOROSCOPIC GUIDANCE MEDICATIONS: Ancef  2 gm IV. The antibiotic was given in an appropriate time interval prior to skin puncture. ANESTHESIA/SEDATION: Local anesthetic and single agent sedation was employed during this procedure. A total of Versed  1 mg was administered intravenously. The patient's level of consciousness and vital signs were monitored continuously by radiology nursing throughout the procedure under my direct supervision. FLUOROSCOPY: Radiation Exposure Index and estimated peak skin dose (PSD); Reference air kerma (RAK), 7.3 mGy. COMPLICATIONS: None immediate. PROCEDURE: Informed written consent was obtained from the patient and/or patient's representative after a discussion of the  risks, benefits, and alternatives to treatment. Questions regarding the procedure were encouraged and answered. The RIGHT neck and chest were prepped with chlorhexidine  in a sterile fashion, and a sterile drape was applied covering the operative field. Maximum barrier sterile technique with sterile gowns and gloves were used for  the procedure. A timeout was performed prior to the initiation of the procedure. After creating a small venotomy incision, a micropuncture kit was utilized to access the internal jugular vein. Real-time ultrasound guidance was utilized for vascular access including the acquisition of a permanent ultrasound image documenting patency of the accessed vessel. The microwire was utilized to measure appropriate catheter length. A stiff Glidewire was advanced to the level of the IVC and the micropuncture sheath was exchanged for a peel-away sheath. A palindrome tunneled hemodialysis catheter measuring 19 cm from tip to cuff was tunneled in a retrograde fashion from the anterior chest wall to the venotomy incision. The catheter was then placed through the peel-away sheath with tips ultimately positioned within the superior aspect of the right atrium. Final catheter positioning was confirmed and documented with a spot radiographic image. The catheter aspirates and flushes normally. The catheter was flushed with appropriate volume heparin  dwells. The catheter exit site was secured with a 2-0 Ethilon retention suture. The venotomy incision was closed with Dermabond. Dressings were applied. The patient tolerated the procedure well without immediate post procedural complication. IMPRESSION: Successful placement of 19 cm tip to cuff tunneled hemodialysis catheter via the RIGHT internal jugular vein. The tip of the catheter is positioned at the superior cavo-atrial junction. The catheter is ready for immediate use. Thom Hall, MD [facility]  Electronically Signed   By: Thom Hall M.D.   On: 08/28/2024 16:41    Cardiac Studies   N/A  Assessment   Principal Problem:   Multiple traumatic injuries Active Problems:   Cardiorenal syndrome with renal failure   Cardiorenal syndrome Low output biventricular heart failure  Plan   Significant chronic biventricular failure EF 35-40% with right heart 05/2024 showed mixed moderate pulmonary HTN and low cardiac output with PAPi only 2.5. Fluid management with dialysis Cr improved 5.07-> 2.54 Still volume overloaded with a lot of mid thigh/LE edema. Transition back to high dose lasix  per nephrology depending on how much dialysis he gets. Coox 90% and has been off milrinone  now for a few days.  Trauma:  with bloody effusions post IR thoracentesis 12/2 and 12/5 CXR with  small to moderate right and small left residual fluid no PTX  Cardiology will see again Monday  Maude Emmer 08/30/2024, 11:11 AM

## 2024-08-30 NOTE — Progress Notes (Signed)
 [facility] Progress Note  Name: Dean Yeley Sr. MRN: [id number] DOB: Sep 11, 1951  Chief Complaint:  Fall with fracture  Subjective:  He had 300 mL UOP over 12/5. Per charge RN, HD was finished early AM around 2am 12/6 with 1 kg UF.  Labs drawn a couple of hours later.  He feels better today than he did.  States working with therapy/moving around.    Review of systems:      some shortness of breath - doing better today Discomfort after his fall with fracture Denies n/v No chest pain  States no difficulty urinating but just not much   --------------- Background on consult:  Dean Holness Sr. is a 73 y.o. male with a history including CAD, CKD stage IV, paroxysmal afib, hx cardiac arrest s/p AICD, heart failure with reduced EF, and HTN who presented to the hospital after a fall down the stairs.  He was found to have an acute T6 fracture - neurosurgery was consulted and no surgery is indicated at this time.  He was also noted on admission to have a moderate sized right pleural effusion, suggestive of hemorrhagic or proteinaceous content.  Also with incidental finding of enlarged prostate and body wall edema.  He didn't have any contrasted studies noted.  Cardiology had seen the patient and initiated diuretics and milrinone  was added today.  Strict ins/outs are not available.  He had 250 mL UOP over 12/1 as well as three unmeasured urine voids.  He has had one unmeasured urine void today.  He has had some mild hypotension today.  CVP is low or negative per nursing report.  The milrinone  is now off.  Nephrology is consulted for assistance with management of AKI.  He was bladder scanned and had 0 ml.  He had a thoracentesis this admit with 500 mL bloody fluid.  He follows with Dr. [name] and baseline Cr appears to be 2-3.  He has previously seen Dr. [name] in heart failure (most recently seen inpt by Dr. Rolan during a 06/2024 admission).  Per cardiology report, heart failure  team is concerned that he may not tolerate dialysis.  He previously was on irbesartan  and torsemide  and has gotten intermittent IV lasix .  He states that when he last spoke with renal after his last admission that his creatinine was at baseline and he was recommended to follow-up as scheduled.  He states urine output has gone down ever since he started hydralazine -isosorbide  combination pill.  He would hope to avoid dialysis if possible.  If he needed it, he thinks he would want to dry but really hopes it doesn't come to that.    Intake/Output Summary (Last 24 hours) at 08/30/2024 0746 Last data filed at 08/30/2024 0225 Gross per 24 hour  Intake 200.03 ml  Output 1300 ml  Net -1099.97 ml    Vitals:  Vitals:   08/30/24 0215 08/30/24 0225 08/30/24 0305 08/30/24 0700  BP: (!) 111/45 (!) 114/41 (!) 121/37   Pulse:   72   Resp: 20 19 16    Temp:  98.2 F (36.8 C) 97.6 F (36.4 C)   TempSrc:  Oral Oral   SpO2:  98% 99%   Weight:    98 kg  Height:         Physical Exam:      General adult male in chair with some increased work of breathing HEENT normocephalic atraumatic extraocular movements intact sclera anicteric Neck supple trachea midline Lungs clear but reduced to auscultation bilaterally  normal work of breathing at rest  Heart S1S2 no rub Abdomen soft nontender obese habitus Extremities bilateral 3+ edema with ulcer on right lower leg which has a gauze wrapping  Psych normal mood and affect Neuro alert and oriented x 3 provides hx and follows commands GU - no foley in place Access: RIJ tunneled catheter in place   Medications reviewed   Labs:     Latest Ref Rng & Units 08/30/2024    5:55 AM 08/29/2024    5:41 PM 08/29/2024    6:12 AM  BMP  Glucose 70 - 99 mg/dL 874   893   BUN 8 - 23 mg/dL 41   65   Creatinine 9.38 - 1.24 mg/dL 7.45   6.47   Sodium 864 - 145 mmol/L 135  131  132   Potassium 3.5 - 5.1 mmol/L 3.7  4.7  4.6   Chloride 98 - 111 mmol/L 98   96   CO2 22 - 32  mmol/L 25   25   Calcium  8.9 - 10.3 mg/dL 9.1   9.6      Assessment/Plan:    # AKI - Cardiorenal and may be intravascularly dry per CVP.  He is peripherally overloaded but with anemia and mild hypoalbuminemia.  Urine output seems to have dropped with hypotension.  Started HD on 12/4 after tunneled catheter placement with IR - HD today - possibly overnight to optimize volume then assess needs daily - plan for lasix  on non-HD days.  Lasix  80 mg IV once ordered for tomorrow (Sunday)   - strict ins/outs and daily weights.  Weights are inconsistent - continue to trend.  Previously, RN was going to request a scale for the floor for standing weights as this is not currently available on the unit   # Chronic heart failure with reduced Ejection fraction - note TTE 04/2024 with LVEF 35-40% and grade 2 diastolic dysfunction as well as moderately elevated pulmonary artery systolic pressure - optimize volume status with HD  - elevate legs    # Acute hypoxic respiratory failure  - on supplemental oxygen  - with overload - difficult to diurese and on HD as an AKI as above  # Hyperkalemia - improved with HD - s/p lokelma  - on renal diabetic diet    # CKD stage IV - Follows with Dr. [name] - Baseline Cr 2-3 - for reference, no SGLT2 inhibitor due to hx of UTI's   # Anemia of CKD - s/p aranesp  40 mcg once on 12/2 to optimize anemia.  Plan for aranesp  60 mcg every Tuesday for now - iron  deficiency - see that iron  IV is ordered   # Right-sided Pleural effusion  - s/p thoracentesis of 500 mL bloody fluid with IR   Disposition - continue inpatient monitoring     Katheryn JAYSON Saba, MD 08/30/2024 8:36 AM

## 2024-08-30 NOTE — Progress Notes (Signed)
Patient returned to unit from dialysis.

## 2024-08-30 NOTE — Progress Notes (Signed)
 Patient returned to unit from hemodialysis by transport.

## 2024-08-30 NOTE — Progress Notes (Signed)
 Received patient in bed to unit.  Alert and oriented.  Informed consent signed and in chart.   TX duration:3 hours  Patient tolerated well.  Transported back to the room  Alert, without acute distress.  Hand-off given to patient's nurse.   Access used: R internal jugular HD cath Access issues: none  Total UF removed: 1.4L Medication(s) given: Albumin , 100 cc bolus NS   08/30/24 1820  Vitals  Temp 97.9 F (36.6 C)  Temp Source Oral  BP 111/62  Pulse Rate (!) 102  Resp (!) 23  Weight 92.5 kg  Type of Weight Post-Dialysis  Oxygen Therapy  SpO2 100 %  O2 Device Nasal Cannula  O2 Flow Rate (L/min) 2 L/min  Patient Activity (if Appropriate) In bed  Pulse Oximetry Type Continuous  Oximetry Probe Site Changed No  During Treatment Monitoring  Blood Flow Rate (mL/min) 199 mL/min  Arterial Pressure (mmHg) -73.13 mmHg  Venous Pressure (mmHg) 64.64 mmHg  TMP (mmHg) 43.84 mmHg  Ultrafiltration Rate (mL/min) 1143 mL/min  Dialysate Flow Rate (mL/min) 299 ml/min  Dialysate Potassium Concentration 3  Dialysate Calcium  Concentration 2.5  Duration of HD Treatment -hour(s) 2.95 hour(s)  Cumulative Fluid Removed (mL) per Treatment  1340.08  HD Safety Checks Performed Yes  Intra-Hemodialysis Comments Tx completed  Post Treatment  Dialyzer Clearance Lightly streaked  Liters Processed 36  Fluid Removed (mL) 1400 mL  Tolerated HD Treatment Yes  Hemodialysis Catheter Right Internal jugular Double lumen Permanent (Tunneled)  Placement Date/Time: 08/28/24 1526   Placed prior to admission: No  Serial / Lot #: [id number]  Expiration Date: 09/27/28  Time Out: Correct patient;Correct site;Correct procedure  Maximum sterile barrier precautions: Hand hygiene;Large sterile sheet;...  Site Condition No complications  Blue Lumen Status Flushed;Antimicrobial dead end cap;Heparin  locked  Red Lumen Status Flushed;Antimicrobial dead end cap;Heparin  locked  Purple Lumen Status N/A  Catheter fill  solution Heparin  1000 units/ml  Catheter fill volume (Arterial) 1.6 cc  Catheter fill volume (Venous) 1.6  Dressing Type Transparent  Dressing Status Antimicrobial disc/dressing in place;Clean, Dry, Intact  Drainage Description None  Dressing Change Due 09/05/24  Post treatment catheter status Capped and Clamped     Camellia Brasil LPN [facility]

## 2024-08-31 DIAGNOSIS — T07XXXA Unspecified multiple injuries, initial encounter: Secondary | ICD-10-CM | POA: Diagnosis not present

## 2024-08-31 LAB — RENAL FUNCTION PANEL
Albumin: 3.4 g/dL — ABNORMAL LOW (ref 3.5–5.0)
Anion gap: 12 (ref 5–15)
BUN: 29 mg/dL — ABNORMAL HIGH (ref 8–23)
CO2: 26 mmol/L (ref 22–32)
Calcium: 9.3 mg/dL (ref 8.9–10.3)
Chloride: 97 mmol/L — ABNORMAL LOW (ref 98–111)
Creatinine, Ser: 2.38 mg/dL — ABNORMAL HIGH (ref 0.61–1.24)
GFR, Estimated: 28 mL/min — ABNORMAL LOW (ref >60)
Glucose, Bld: 86 mg/dL (ref 70–99)
Phosphorus: 2.7 mg/dL (ref 2.5–4.6)
Potassium: 3.9 mmol/L (ref 3.5–5.1)
Sodium: 135 mmol/L (ref 135–145)

## 2024-08-31 MED ORDER — FUROSEMIDE 10 MG/ML IJ SOLN
80.0000 mg | Freq: Once | INTRAMUSCULAR | Status: AC
  Administered 2024-09-01: 80 mg via INTRAVENOUS
  Filled 2024-08-31: qty 8

## 2024-08-31 MED ORDER — ALTEPLASE 2 MG IJ SOLR
2.0000 mg | Freq: Once | INTRAMUSCULAR | Status: AC
  Administered 2024-08-31: 2 mg
  Filled 2024-08-31: qty 2

## 2024-08-31 NOTE — Plan of Care (Signed)

## 2024-08-31 NOTE — Progress Notes (Signed)
 [facility] Progress Note  Name: Dean Naval Sr. MRN: [id number] DOB: January 06, 1951  Chief Complaint:  Fall with fracture  Subjective:  He had no urine output reported over 12/6.  He had 1.4 liters UF removed (which is incorrectly recorded in flowsheet as 2.8 liters - somehow was duplicated).  No labs were ordered.  He hopes he can come off of dialysis.  He states that he did actually urinate overnight - maybe about 200 mL.    Review of systems:     no shortness of breath  Discomfort after his fall with fracture Denies n/v No chest pain  States no difficulty urinating   --------------- Background on consult:  Dean Bair Sr. is a 73 y.o. male with a history including CAD, CKD stage IV, paroxysmal afib, hx cardiac arrest s/p AICD, heart failure with reduced EF, and HTN who presented to the hospital after a fall down the stairs.  He was found to have an acute T6 fracture - neurosurgery was consulted and no surgery is indicated at this time.  He was also noted on admission to have a moderate sized right pleural effusion, suggestive of hemorrhagic or proteinaceous content.  Also with incidental finding of enlarged prostate and body wall edema.  He didn't have any contrasted studies noted.  Cardiology had seen the patient and initiated diuretics and milrinone  was added today.  Strict ins/outs are not available.  He had 250 mL UOP over 12/1 as well as three unmeasured urine voids.  He has had one unmeasured urine void today.  He has had some mild hypotension today.  CVP is low or negative per nursing report.  The milrinone  is now off.  Nephrology is consulted for assistance with management of AKI.  He was bladder scanned and had 0 ml.  He had a thoracentesis this admit with 500 mL bloody fluid.  He follows with Dr. [name] and baseline Cr appears to be 2-3.  He has previously seen Dr. [name] in heart failure (most recently seen inpt by Dr. Rolan during a 06/2024 admission).  Per  cardiology report, heart failure team is concerned that he may not tolerate dialysis.  He previously was on irbesartan  and torsemide  and has gotten intermittent IV lasix .  He states that when he last spoke with renal after his last admission that his creatinine was at baseline and he was recommended to follow-up as scheduled.  He states urine output has gone down ever since he started hydralazine -isosorbide  combination pill.  He would hope to avoid dialysis if possible.  If he needed it, he thinks he would want to dry but really hopes it doesn't come to that.    Intake/Output Summary (Last 24 hours) at 08/31/2024 0806 Last data filed at 08/30/2024 1855 Gross per 24 hour  Intake 320 ml  Output 2800 ml  Net -2480 ml    Vitals:  Vitals:   08/30/24 2000 08/30/24 2320 08/31/24 0320 08/31/24 0500  BP: (!) 106/46 (!) 113/49 (!) 115/53   Pulse: 68 69 62   Resp: 13 14 18    Temp: (!) 97.4 F (36.3 C) 97.6 F (36.4 C) (!) 97.4 F (36.3 C)   TempSrc: Oral Oral Oral   SpO2: 90% 95% 98%   Weight:    90 kg  Height:         Physical Exam:     General adult male in bed in no acute distress  HEENT normocephalic atraumatic extraocular movements intact sclera anicteric Neck supple trachea midline  Lungs clear but reduced to auscultation bilaterally normal work of breathing at rest; on room air Heart S1S2 no rub Abdomen soft nontender obese habitus Extremities bilateral 3+ edema; right lower leg which has a gauze wrapping (over a known ulcer) Psych normal mood and affect Neuro alert and oriented x 3 provides hx and follows commands GU - no foley in place Access: RIJ tunneled catheter in place   Medications reviewed   Labs:     Latest Ref Rng & Units 08/30/2024    5:55 AM 08/29/2024    5:41 PM 08/29/2024    6:12 AM  BMP  Glucose 70 - 99 mg/dL 874   893   BUN 8 - 23 mg/dL 41   65   Creatinine 9.38 - 1.24 mg/dL 7.45   6.47   Sodium 864 - 145 mmol/L 135  131  132   Potassium 3.5 - 5.1 mmol/L  3.7  4.7  4.6   Chloride 98 - 111 mmol/L 98   96   CO2 22 - 32 mmol/L 25   25   Calcium  8.9 - 10.3 mg/dL 9.1   9.6      Assessment/Plan:    # AKI - Cardiorenal and may be intravascularly dry per CVP.  He is peripherally overloaded but with anemia and mild hypoalbuminemia.  Urine output seems to have dropped with hypotension.  Started HD on 12/4 after tunneled catheter placement with IR - ordered daily labs - will follow today's labs - Lasix  80 mg IV once today and in AM on Monday.  If response, would order lasix  on non-HD days    - Would plan on HD on Tuesday, 12/9.  Assess dialysis needs daily   - strict ins/outs and daily weights.  We have requested a scale for the floor for standing weights as this is not currently available on the unit   # Chronic heart failure with reduced Ejection fraction - note TTE 04/2024 with LVEF 35-40% and grade 2 diastolic dysfunction as well as moderately elevated pulmonary artery systolic pressure - optimize volume status with HD  - elevate legs    # Acute hypoxic respiratory failure  - on supplemental oxygen  - with overload - difficult to diurese and on HD as an AKI as above  # Hyperkalemia - currently managed with HD - s/p lokelma  - on renal diabetic diet    # CKD stage IV - Follows with Dr. [name] - Baseline Cr 2-3 - for reference, no SGLT2 inhibitor due to hx of UTI's   # Anemia of CKD - s/p aranesp  40 mcg once on 12/2 to optimize anemia.  Plan for aranesp  60 mcg every Tuesday for now - iron  deficiency - s/p iron  sucrose x 2 doses   # Right-sided Pleural effusion  - s/p thoracentesis of 500 mL bloody fluid with IR   Disposition - continue inpatient monitoring     Katheryn JAYSON Saba, MD 08/31/2024 8:28 AM

## 2024-08-31 NOTE — Progress Notes (Addendum)
 PROGRESS NOTE  Dean Croft Sr. FMW:[id number] DOB: [birth year] DOA: 08/22/2024 PCP: [name], MD   LOS: 9 days   Brief narrative:  Patient is 73 years old male with past medical history of  CAD, PAF, ICM, cardiac arrest s/p AICD, HFrEF (EF 35-40% in 04/2024), hypertension, hyperlipidemia, type 2 diabetes, obstructive sleep apnea and recent admission on 07/16/24 for HFrEF exacerbation presented to hospital with back pain after falling from the wheelchair.  He was noted to have acute kidney injury and T6 fracture.  Has been having lower extremity edema as well.  Patient's hospital stay was complicated with heart failure with worsening renal function and cardiology and nephrology were consulted.  Patient had hemodialysis initiated for volume management and underwent thoracocentesis on 1128 and 12- 5 for worsening right pleural effusion.  Also required IV iron .      Assessment/Plan: Principal Problem:   Multiple traumatic injuries Active Problems:   Cardiorenal syndrome with renal failure   Cardiorenal syndrome  Cardiorenal syndrome with renal failure / Low output biventricular heart failure - Cardiology following.  Last 2D echocardiogram with EF of 35 to 40%.  Cardiac catheterization 9/25 showed moderate pulmonary hypertension and low cardiac output.  Fluid management currently with dialysis.  Cardiology on board.  Plan for 80 mg of IV Lasix  today and on Monday.  Plan for hemodialysis 12/ 9/25.  Continue strict intake and output charting.    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 Status post hemodialysis catheter placement and first hemodialysis on 08/28/2024. Follow nephrology recommendation.  Continue intake and output check daily weights.  Patient follows up with Dr. [name] as outpatient.   Acute on chronic anemia -likely from CKD and iron  deficiency.  Received IV iron . Latest hemoglobin of 7.5.  Status post Aranesp    Acute T6 fracture, physical deconditioning: Neurosurgery was consulted.   Recommended TSLO brace and outpatient follow-up with  Dr. [name]).  Continue PT OT   R pleural effusion: RML/RLL consolidation Patient underwent IR guided thoracentesis on 11/28 with removal of 500 mL of bloody fluid.  Patient also had repeat thoracentesis on 12/5, with removal of 1.5L of bloody fluid.  Currently on 2 L of oxygen by nasal cannula.  Will continue to wean as able.   Hyperkalemia -resolved on hemodialysis   Liver enhancement on imaging: Could be secondary to amiodarone .   PAF: -PTA amiodarone  200mg  daily and Eliquis  5mg  BID (currently held)  DVT prophylaxis: heparin  injection 5,000 Units Start: 08/27/24 1615   Disposition: Likely home with home health  Status is: Inpatient Remains inpatient appropriate because: Need for hemodialysis, pending clinical improvement    Code Status:     Code Status: Limited: Do not attempt resuscitation (DNR) -DNR-LIMITED -Do Not Intubate/DNI   Family Communication: I spoke with the patient's daughter on the phone and updated her about the clinical condition of the patient.   Consultants: Nephrology Cardiology Trauma surgery  Procedures: Thoracocentesis Hemodialysis  Anti-infectives:  None  Anti-infectives (From admission, onward)    Start     Dose/Rate Route Frequency Ordered Stop   08/28/24 1345  ceFAZolin  (ANCEF ) IVPB 2g/100 mL premix        2 g 200 mL/hr over 30 Minutes Intravenous To Radiology 08/28/24 1247 08/28/24 1545        Subjective: Today, patient was seen and examined at bedside.  Complains of swelling on the legs.  Feels fatigued and tired.  Denies any shortness of breath dyspnea chest pain.  Has pain on movement on the back.  Objective: Vitals:  08/31/24 0320 08/31/24 0755  BP: (!) 115/53 (!) 123/58  Pulse: 62 67  Resp: 18   Temp: (!) 97.4 F (36.3 C) (!) 97.5 F (36.4 C)  SpO2: 98% 97%    Intake/Output Summary (Last 24 hours) at 08/31/2024 1043 Last data filed at 08/31/2024 0730 Gross per 24  hour  Intake 300 ml  Output 3000 ml  Net -2700 ml   Filed Weights   08/30/24 1820 08/30/24 1822 08/31/24 0500  Weight: 92.5 kg 92.5 kg 90 kg   Body mass index is 32.02 kg/m.   Physical Exam: GENERAL: Patient is alert awake and oriented. Not in obvious distress.  Obese appearing, nasal cannula oxygen HENT: No scleral pallor or icterus. Pupils equally reactive to light. Oral mucosa is moist NECK: is supple, no gross swelling noted. CHEST: Diminished breath sounds bilaterally, right chest wall with hemodialysis catheter.  Right upper extremity PICC line in place CVS: S1 and S2 heard, no murmur. Regular rate and rhythm.  ABDOMEN: Soft, non-tender, bowel sounds are present. EXTREMITIES: Bilateral lower extremity pitting edema +++, right leg with dressing. CNS: Cranial nerves are intact.  Moves all extremities SKIN: warm and dry without rashes.  Data Review: I have personally reviewed the following laboratory data and studies,  CBC: Recent Labs  Lab 08/25/24 0926 08/26/24 0345 08/27/24 0431 08/28/24 0935 08/29/24 1741  WBC 6.7 7.7 8.8 5.9  --   HGB 8.8* 8.4* 7.4* 7.1* 7.5*  HCT 28.4* 26.2* 23.5* 21.7* 22.0*  MCV 93.7 91.9 92.2 92.7  --   PLT 190 200 184 176  --    Basic Metabolic Panel: Recent Labs  Lab 08/26/24 0345 08/26/24 1332 08/27/24 0431 08/28/24 0617 08/29/24 0612 08/29/24 1741 08/30/24 0555  NA 131*  --  131* 130* 132* 131* 135  K 5.1  --  5.7* 5.5* 4.6 4.7 3.7  CL 101  --  102 100 96*  --  98  CO2 20*  --  21* 21* 25  --  25  GLUCOSE 113*  --  104* 94 106*  --  125*  BUN 81*  --  84* 91* 65*  --  41*  CREATININE 4.32* 5.01* 4.89* 5.07* 3.52*  --  2.54*  CALCIUM  9.6  --  9.7 9.5 9.6  --  9.1  MG 1.8  --  1.7 1.9 1.9  --  1.9  PHOS  --   --   --   --   --   --  2.5   Liver Function Tests: Recent Labs  Lab 08/25/24 1351  AST 21  ALT 15  ALKPHOS 71  BILITOT 1.4*  PROT 6.7  ALBUMIN  3.1*   No results for input(s): LIPASE, AMYLASE in the last  168 hours. No results for input(s): AMMONIA in the last 168 hours. Cardiac Enzymes: No results for input(s): CKTOTAL, CKMB, CKMBINDEX, TROPONINI in the last 168 hours. BNP (last 3 results) Recent Labs    06/25/24 1604 07/10/24 0227 08/22/24 0903  BNP 1,585.3* 1,952.1* 525.0*    ProBNP (last 3 results) No results for input(s): PROBNP in the last 8760 hours.  CBG: No results for input(s): GLUCAP in the last 168 hours. Recent Results (from the past 240 hours)  Body fluid culture w Gram Stain     Status: None   Collection Time: 08/22/24 12:22 PM   Specimen: Lung, Right; Pleural Fluid  Result Value Ref Range Status   Specimen Description PLEURAL  Final   Special Requests RIGHT LUNG  Final  Gram Stain   Final    RARE WBC PRESENT, PREDOMINANTLY PMN NO ORGANISMS SEEN    Culture   Final    NO GROWTH 3 DAYS Performed at [facility], [address], [place], [place] [address]    Report Status 08/25/2024 FINAL  Final     Studies: DG CHEST PORT 1 VIEW Result Date: 08/30/2024 EXAM: 1 VIEW(S) XRAY OF THE CHEST 08/30/2024 06:48:31 AM COMPARISON: 08/29/2024 CLINICAL HISTORY: Pleural effusion FINDINGS: LINES, TUBES AND DEVICES: Stable right IJ CVC and right PICC in place. Left chest AICD stable in place. LUNGS AND PLEURA: Small to moderate right and small left pleural effusions, similar. Bibasilar atelectasis. Stable mild diffuse interstitial prominence consistent with pulmonary edema. No focal pulmonary opacity. No pneumothorax. HEART AND MEDIASTINUM: Cardiomegaly unchanged. BONES AND SOFT TISSUES: No acute osseous abnormality. IMPRESSION: 1. Small to moderate right and small left pleural effusions, similar to prior study. 2. Stable mild diffuse interstitial prominence consistent with pulmonary edema. Electronically signed by: Waddell Calk MD 08/30/2024 07:25 AM EST RP Workstation: [id number]   IR THORACENTESIS ASP PLEURAL SPACE W/IMG GUIDE Result Date:  08/29/2024 INDICATION: 73 year old male with hemothorax after recent fall. EXAM: ULTRASOUND GUIDED RIGHT THORACENTESIS MEDICATIONS: 10 mL% lidocaine  COMPLICATIONS: None immediate. PROCEDURE: An ultrasound guided thoracentesis was thoroughly discussed with the patient and questions answered. The benefits, risks, alternatives and complications were also discussed. The patient understands and wishes to proceed with the procedure. Written consent was obtained. Ultrasound was performed to localize and mark an adequate pocket of fluid in the right chest. The area was then prepped and draped in the normal sterile fashion. 1% Lidocaine  was used for local anesthesia. Under ultrasound guidance a 6 Fr Safe-T-Centesis catheter was introduced. Thoracentesis was performed. The catheter was removed and a dressing applied. FINDINGS: A total of approximately 1.5 liters of bloody fluid was removed. Samples were sent to the laboratory as requested by the clinical team. IMPRESSION: Successful ultrasound guided right thoracentesis yielding 1.5 L of pleural fluid. Performed by: Kacie Matthews PA-C Electronically Signed   By: Cordella Banner   On: 08/29/2024 15:58   DG Chest 1 View Result Date: 08/29/2024 EXAM: 1 VIEW(S) XRAY OF THE CHEST 08/29/2024 12:29:00 PM COMPARISON: Yesterday's study (08/28/2024). CLINICAL HISTORY: [id number] S/P thoracentesis [id number] S/P thoracentesis. FINDINGS: LINES, TUBES AND DEVICES: Interval placement of right ankle jugular catheter with distal tip inspected position is best seen. Right-sided PICC line is unchanged. LUNGS AND PLEURA: Small pleural effusions are noted. Minimal right basilar atelectasis. No focal pulmonary opacity. No pneumothorax. HEART AND MEDIASTINUM: Stable cardiomegaly. Left-sided defibrillator is unchanged. BONES AND SOFT TISSUES: No acute osseous abnormality. IMPRESSION: 1. Small pleural effusions. 2. Stable cardiomegaly with unchanged left-sided defibrillator. 3. Right internal jugular  catheter placed with distal tip in appropriate position. Unchanged right-sided PICC line. 4. Minimal right basilar atelectasis. Electronically signed by: Lynwood Seip MD 08/29/2024 12:56 PM EST RP Workstation: [id number]      Vernal Alstrom, MD  [facility] 08/31/2024  If 7PM-7AM, please contact night-coverage

## 2024-09-01 DIAGNOSIS — T07XXXA Unspecified multiple injuries, initial encounter: Secondary | ICD-10-CM | POA: Diagnosis not present

## 2024-09-01 LAB — COOXEMETRY PANEL
Carboxyhemoglobin: 1.7 % — ABNORMAL HIGH (ref 0.5–1.5)
Carboxyhemoglobin: 1.2 % (ref 0.5–1.5)
Carboxyhemoglobin: 2.5 % — ABNORMAL HIGH (ref 0.5–1.5)
Methemoglobin: 1.3 % (ref 0.0–1.5)
Methemoglobin: <0.7 % (ref 0.0–1.5)
Methemoglobin: <0.7 % (ref 0.0–1.5)
O2 Saturation: 56.5 %
O2 Saturation: 47.8 %
O2 Saturation: 60.7 %
Total hemoglobin: <6.9 g/dL — CL (ref 12.0–16.0)
Total hemoglobin: <6.9 g/dL — CL (ref 12.0–16.0)
Total hemoglobin: <6.9 g/dL — CL (ref 12.0–16.0)

## 2024-09-01 LAB — CBC
HCT: 22.4 % — ABNORMAL LOW (ref 39.0–52.0)
Hemoglobin: 7.2 g/dL — ABNORMAL LOW (ref 13.0–17.0)
MCH: 29.9 pg (ref 26.0–34.0)
MCHC: 32.1 g/dL (ref 30.0–36.0)
MCV: 92.9 fL (ref 80.0–100.0)
Platelets: 126 K/uL — ABNORMAL LOW (ref 150–400)
RBC: 2.41 MIL/uL — ABNORMAL LOW (ref 4.22–5.81)
RDW: 15.7 % — ABNORMAL HIGH (ref 11.5–15.5)
WBC: 6.2 K/uL (ref 4.0–10.5)
nRBC: 0.3 % — ABNORMAL HIGH (ref 0.0–0.2)

## 2024-09-01 LAB — RENAL FUNCTION PANEL
Albumin: 3.2 g/dL — ABNORMAL LOW (ref 3.5–5.0)
Anion gap: 12 (ref 5–15)
BUN: 33 mg/dL — ABNORMAL HIGH (ref 8–23)
CO2: 25 mmol/L (ref 22–32)
Calcium: 9.5 mg/dL (ref 8.9–10.3)
Chloride: 97 mmol/L — ABNORMAL LOW (ref 98–111)
Creatinine, Ser: 2.5 mg/dL — ABNORMAL HIGH (ref 0.61–1.24)
GFR, Estimated: 26 mL/min — ABNORMAL LOW (ref >60)
Glucose, Bld: 89 mg/dL (ref 70–99)
Phosphorus: 3.2 mg/dL (ref 2.5–4.6)
Potassium: 3.9 mmol/L (ref 3.5–5.1)
Sodium: 134 mmol/L — ABNORMAL LOW (ref 135–145)

## 2024-09-01 LAB — MAGNESIUM: Magnesium: 1.8 mg/dL (ref 1.7–2.4)

## 2024-09-01 MED ORDER — MILRINONE LACTATE IN DEXTROSE 20-5 MG/100ML-% IV SOLN
0.2500 ug/kg/min | INTRAVENOUS | Status: DC
  Administered 2024-09-01 – 2024-09-02 (×2): 0.25 ug/kg/min via INTRAVENOUS
  Filled 2024-09-01 (×2): qty 100

## 2024-09-01 MED ORDER — IRON SUCROSE 200 MG IVPB - SIMPLE MED
200.0000 mg | Status: DC
  Administered 2024-09-01: 200 mg via INTRAVENOUS
  Filled 2024-09-01: qty 200
  Filled 2024-09-01: qty 110

## 2024-09-01 MED ORDER — ORAL CARE MOUTH RINSE: 15.0000 mL | OROMUCOSAL | Status: DC | PRN

## 2024-09-01 MED ORDER — FUROSEMIDE 10 MG/ML IJ SOLN: 120.0000 mg | Freq: Once | INTRAVENOUS | Status: DC

## 2024-09-01 NOTE — Plan of Care (Signed)

## 2024-09-01 NOTE — Progress Notes (Signed)
 PROGRESS NOTE  Dean Croft Sr. FMW:[id number] DOB: 1951-07-04 DOA: 08/22/2024 PCP: [name], MD   LOS: 10 days   Brief narrative:  Patient is 73 years old male with past medical history of  CAD, PAF, ICM, cardiac arrest s/p AICD, HFrEF (EF 35-40% in 04/2024), hypertension, hyperlipidemia, type 2 diabetes, obstructive sleep apnea and recent admission on 07/16/24 for HFrEF exacerbation presented to hospital with back pain after falling from the wheelchair.  He was noted to have acute kidney injury and T6 fracture.  Has been having lower extremity edema as well.  Patient's hospital stay was complicated with heart failure with worsening renal function and cardiology and nephrology were consulted.  Patient had hemodialysis initiated for volume management and underwent thoracocentesis on 11-28 and 12- 5 for worsening right pleural effusion.  Also required IV iron .      Assessment/Plan: Principal Problem:   Multiple traumatic injuries Active Problems:   Cardiorenal syndrome with renal failure   Cardiorenal syndrome  Cardiorenal syndrome with renal failure / Low output biventricular heart failure - Cardiology following.  Last 2D echocardiogram with EF of 35 to 40%.  Cardiac catheterization 9/25 showed moderate pulmonary hypertension and low cardiac output.  Patient was initially on milrinone  drip with cardiology.  Fluid management currently with dialysis.  Cardiology on board.  Received 80 mg of IV Lasix  yesterday and plan for today.  Plan for hemodialysis 12/ 9/25.  Continue strict intake and output charting.  Patient had 400 mL urinary output in the last 24 hours.    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 Status post hemodialysis catheter placement and first hemodialysis on 08/28/2024. Follow nephrology recommendation.  Continue intake and output, check daily weights.  Patient follows up with Dr. [name] as outpatient.  Plan for hemodialysis 09/02/2024   Acute on chronic anemia -likely from CKD and iron   deficiency.  Received IV iron . Latest hemoglobin of 7.2 from 7.5.  Status post Aranesp    Acute T6 fracture, physical deconditioning: Neurosurgery was consulted.  Recommended TSLO brace and outpatient follow-up with neurosurgery Dr. [name].  Continue PT OT while in the hospital.   R pleural effusion: RML/RLL consolidation Patient underwent IR guided thoracentesis on 11/28 with removal of 500 mL of bloody fluid.  Patient also had repeat thoracentesis on 12/5, with removal of 1.5L of bloody fluid.  Currently room air and remained stable.   Hyperkalemia -resolved on hemodialysis.  Latest potassium of 3.9   Liver enhancement on imaging: Could be secondary to amiodarone .   PAF: Patient on amiodarone  200mg  daily.  Patient was on Eliquis  5mg  BID (currently held) due to anemia.  DVT prophylaxis: heparin  injection 5,000 Units Start: 08/27/24 1615   Disposition: Likely home with home health  Status is: Inpatient Remains inpatient appropriate because: Need for hemodialysis, pending clinical improvement    Code Status:     Code Status: Limited: Do not attempt resuscitation (DNR) -DNR-LIMITED -Do Not Intubate/DNI   Family Communication: Spoke with the patient's daughter 08/31/2024  Consultants: Nephrology Cardiology Trauma surgery  Procedures: Thoracocentesis Hemodialysis  Anti-infectives:  None  Anti-infectives (From admission, onward)    Start     Dose/Rate Route Frequency Ordered Stop   08/28/24 1345  ceFAZolin  (ANCEF ) IVPB 2g/100 mL premix        2 g 200 mL/hr over 30 Minutes Intravenous To Radiology 08/28/24 1247 08/28/24 1545        Subjective: Today, patient was seen and examined at bedside.  Sitting up in the bedside.  Denies any shortness of breath  but has some cough.  No chest pain fever chills or rigor.  Has been eating some.  Has had bowel movement.  Objective: Vitals:   09/01/24 0610 09/01/24 0812  BP:  (!) 125/56  Pulse:  68  Resp:  13  Temp: (!) 97.4 F  (36.3 C) (!) 97.4 F (36.3 C)  SpO2:  96%    Intake/Output Summary (Last 24 hours) at 09/01/2024 1037 Last data filed at 09/01/2024 9378 Gross per 24 hour  Intake 510 ml  Output 200 ml  Net 310 ml   Filed Weights   08/30/24 1822 08/31/24 0500 09/01/24 0500  Weight: 92.5 kg 90 kg 90.8 kg   Body mass index is 32.31 kg/m.   Physical Exam: GENERAL: Patient is alert awake and oriented. Not in obvious distress.  Obese appearing HENT: No scleral pallor or icterus. Pupils equally reactive to light. Oral mucosa is moist NECK: is supple, no gross swelling noted. CHEST: Diminished breath sounds bilaterally, right chest wall with hemodialysis catheter.  Right upper extremity PICC line in place CVS: S1 and S2 heard, no murmur. Regular rate and rhythm.  ABDOMEN: Soft, non-tender, bowel sounds are present. EXTREMITIES: Bilateral lower extremity pitting edema +++, right leg with dressing. CNS: Cranial nerves are intact.  Moves all extremities SKIN: warm and dry, right leg with dressing.  Data Review: I have personally reviewed the following laboratory data and studies,  CBC: Recent Labs  Lab 08/26/24 0345 08/27/24 0431 08/28/24 0935 08/29/24 1741 09/01/24 0558  WBC 7.7 8.8 5.9  --  6.2  HGB 8.4* 7.4* 7.1* 7.5* 7.2*  HCT 26.2* 23.5* 21.7* 22.0* 22.4*  MCV 91.9 92.2 92.7  --  92.9  PLT 200 184 176  --  126*   Basic Metabolic Panel: Recent Labs  Lab 08/27/24 0431 08/28/24 0617 08/29/24 0612 08/29/24 1741 08/30/24 0555 08/31/24 0830 09/01/24 0558  NA 131* 130* 132* 131* 135 135 134*  K 5.7* 5.5* 4.6 4.7 3.7 3.9 3.9  CL 102 100 96*  --  98 97* 97*  CO2 21* 21* 25  --  25 26 25   GLUCOSE 104* 94 106*  --  125* 86 89  BUN 84* 91* 65*  --  41* 29* 33*  CREATININE 4.89* 5.07* 3.52*  --  2.54* 2.38* 2.50*  CALCIUM  9.7 9.5 9.6  --  9.1 9.3 9.5  MG 1.7 1.9 1.9  --  1.9  --  1.8  PHOS  --   --   --   --  2.5 2.7 3.2   Liver Function Tests: Recent Labs  Lab 08/25/24 1351  08/31/24 0830 09/01/24 0558  AST 21  --   --   ALT 15  --   --   ALKPHOS 71  --   --   BILITOT 1.4*  --   --   PROT 6.7  --   --   ALBUMIN  3.1* 3.4* 3.2*   No results for input(s): LIPASE, AMYLASE in the last 168 hours. No results for input(s): AMMONIA in the last 168 hours. Cardiac Enzymes: No results for input(s): CKTOTAL, CKMB, CKMBINDEX, TROPONINI in the last 168 hours. BNP (last 3 results) Recent Labs    06/25/24 1604 07/10/24 0227 08/22/24 0903  BNP 1,585.3* 1,952.1* 525.0*    ProBNP (last 3 results) No results for input(s): PROBNP in the last 8760 hours.  CBG: No results for input(s): GLUCAP in the last 168 hours. Recent Results (from the past 240 hours)  Body fluid culture w  Gram Stain     Status: None   Collection Time: 08/22/24 12:22 PM   Specimen: Lung, Right; Pleural Fluid  Result Value Ref Range Status   Specimen Description PLEURAL  Final   Special Requests RIGHT LUNG  Final   Gram Stain   Final    RARE WBC PRESENT, PREDOMINANTLY PMN NO ORGANISMS SEEN    Culture   Final    NO GROWTH 3 DAYS Performed at [facility], [address], [place], [place] [address]    Report Status 08/25/2024 FINAL  Final     Studies: No results found.     Shaniquia Brafford, MD  [facility] 09/01/2024  If 7PM-7AM, please contact night-coverage

## 2024-09-01 NOTE — Progress Notes (Signed)
 [facility] Progress Note   Assessment/ Plan:   # AKI - Cardiorenal and may be intravascularly dry per CVP.  He is peripherally overloaded but with anemia and mild hypoalbuminemia.  Urine output seems to have dropped with hypotension.  Started HD on 12/4 after tunneled catheter placement with IR - ordered daily labs - will follow today's labs - Lasix  challenge today - HD for tomorrow if needed   # Chronic heart failure with reduced Ejection fraction - note TTE 04/2024 with LVEF 35-40% and grade 2 diastolic dysfunction as well as moderately elevated pulmonary artery systolic pressure - optimize volume status with HD  - elevate legs    # Acute hypoxic respiratory failure  - on supplemental oxygen  - with overload - difficult to diurese and on HD as an AKI as above   # Hyperkalemia - currently managed with HD - s/p lokelma  - on renal diabetic diet    # CKD stage IV - Follows with Dr. [name] - Baseline Cr 2-3 - for reference, no SGLT2 inhibitor due to hx of UTI's   # Anemia of CKD - s/p aranesp  40 mcg once on 12/2 to optimize anemia.  Plan for aranesp  60 mcg every Tuesday for now - iron  deficiency - s/p iron  sucrose x 2 doses  - order 3 more coses of iron --1000 mg total  # Right-sided Pleural effusion  - s/p thoracentesis of 500 mL bloody fluid with IR   Disposition - continue inpatient monitoring     Subjective:    Seen in room.  UOP increasing, appears that Cr is plateauing.  For HD tomorrow if needed   Objective:   BP (!) 118/94 (BP Location: Left Arm)   Pulse 70   Temp 98 F (36.7 C) (Oral)   Resp 18   Ht 5' 6 (1.676 m)   Wt 90.8 kg   SpO2 95%   BMI 32.31 kg/m   Intake/Output Summary (Last 24 hours) at 09/01/2024 1226 Last data filed at 09/01/2024 9378 Gross per 24 hour  Intake 510 ml  Output 200 ml  Net 310 ml   Weight change: -3.5 kg  Physical Exam: Gen:NAD CVS: RRR Resp: muffled bilateral bases Abd: distended Ext:in UNNA  boots ACCESS: R internal jugular TDC  Imaging: No results found.  Labs: BMET Recent Labs  Lab 08/26/24 0345 08/26/24 1332 08/27/24 0431 08/28/24 0617 08/29/24 0612 08/29/24 1741 08/30/24 0555 08/31/24 0830 09/01/24 0558  NA 131*  --  131* 130* 132* 131* 135 135 134*  K 5.1  --  5.7* 5.5* 4.6 4.7 3.7 3.9 3.9  CL 101  --  102 100 96*  --  98 97* 97*  CO2 20*  --  21* 21* 25  --  25 26 25   GLUCOSE 113*  --  104* 94 106*  --  125* 86 89  BUN 81*  --  84* 91* 65*  --  41* 29* 33*  CREATININE 4.32* 5.01* 4.89* 5.07* 3.52*  --  2.54* 2.38* 2.50*  CALCIUM  9.6  --  9.7 9.5 9.6  --  9.1 9.3 9.5  PHOS  --   --   --   --   --   --  2.5 2.7 3.2   CBC Recent Labs  Lab 08/26/24 0345 08/27/24 0431 08/28/24 0935 08/29/24 1741 09/01/24 0558  WBC 7.7 8.8 5.9  --  6.2  HGB 8.4* 7.4* 7.1* 7.5* 7.2*  HCT 26.2* 23.5* 21.7* 22.0* 22.4*  MCV 91.9 92.2  92.7  --  92.9  PLT 200 184 176  --  126*    Medications:     acetaminophen   650 mg Oral Q6H   amiodarone   200 mg Oral Daily   atorvastatin   40 mg Oral Daily   Chlorhexidine  Gluconate Cloth  6 each Topical Daily   Chlorhexidine  Gluconate Cloth  6 each Topical Q0600   Chlorhexidine  Gluconate Cloth  6 each Topical Q0600   collagenase    Topical Daily   [START ON 09/02/2024] darbepoetin (ARANESP ) injection - DIALYSIS  60 mcg Subcutaneous Q Tue-1800   heparin  injection (subcutaneous)  5,000 Units Subcutaneous Q8H   heparin  sodium (porcine)  10,000 Units Intracatheter Once   hydrocerin   Topical BID   lidocaine -EPINEPHrine   20 mL Intradermal Once   lidocaine -EPINEPHrine   20 mL Intradermal Once   polyethylene glycol  17 g Oral Daily   sodium chloride  flush  10-40 mL Intracatheter Q12H    Almarie Bonine, MD 09/01/2024, 12:26 PM

## 2024-09-01 NOTE — Progress Notes (Signed)
 Lab called and stated that the Hemoglobin was less than 6.9. RN paged on call Cardiology MD with the results

## 2024-09-01 NOTE — Progress Notes (Addendum)
 Progress Note  Patient Name: Dean Knick Sr. Date of Encounter: 09/01/2024 [facility] Cardiologist: Vinie JAYSON Maxcy, MD     Interval Summary   Patient feeling well today. Denies chest pain, shortness of breath. Continues to have lower extremity swelling, but he believes it is improving. No palpitations. Hopeful that dialysis will be temporary   Vital Signs Vitals:   09/01/24 0346 09/01/24 0500 09/01/24 0610 09/01/24 0812  BP: (!) 119/48   (!) 125/56  Pulse: 68   68  Resp: 18   13  Temp: 97.6 F (36.4 C)  (!) 97.4 F (36.3 C) (!) 97.4 F (36.3 C)  TempSrc: Oral  Oral Oral  SpO2: 95%   96%  Weight:  90.8 kg    Height:        Intake/Output Summary (Last 24 hours) at 09/01/2024 0940 Last data filed at 09/01/2024 9378 Gross per 24 hour  Intake 510 ml  Output 200 ml  Net 310 ml      09/01/2024    5:00 AM 08/31/2024    5:00 AM 08/30/2024    6:22 PM  Last 3 Weights  Weight (lbs) 200 lb 2.8 oz 198 lb 6.6 oz 203 lb 14.8 oz  Weight (kg) 90.8 kg 90 kg 92.5 kg      Telemetry/ECG  NSR, rare PVCs - Personally Reviewed  Physical Exam  GEN: No acute distress.  Sitting on the side of the bed eating breakfast  Neck: No JVD Cardiac:  RRR, no murmurs, rubs, or gallops.  Respiratory: Diminished breath sounds in RLL, otherwise clear. Normal WOB on room air  GI: Soft, nontender, non-distended  MS: 2+ edema in BLE. Right lower leg wrapped in gauze. Hands and feet are warm   Assessment & Plan   Acute on chronic biventricular heart failure  Low output heart failure  AKI on CKD stage IV  - Historically has been primarily ischemic cardiomyopathy  - Echocardiogram 04/2024 showed EF 35-40%, grade II DD, mildly reduced RV systolic function  - Presented on 11/28 after a fall, found to have AKI, T6 fracture, moderate right pleural effusion. Underwent thoracentesis (bloody). Torsemide  held. Cardiology consulted on 12/1 for volume overload. Co-ox was 59%, started on milrinone  with  improvement. Treated with diuretics, but patient became hypotensive and anuric. Milrinone  and diuretics stopped. Continued to have worsening renal function  - Now patient on dialysis- nephrology following. Also trying IV lasix    - Denies shortness of breath. Continues to have lower extremity edema which is reportedly improving. Hands and feet are warm  - BP stable, 125/56 this morning. Was as low as 96/60 overnight  - GDMT limited by renal function, low BP  Valvular disease - TEE from 05/1014 showed moderate MR, severe lead related TR  - Patient on HD and lasix  as above. Unlikely to be a candidate for repair/replacement   History of VT/VF s/p ICD  PAF  - Maintaining NSR with only rare PVCs  - Continue amiodarone  200 mg daily. Eliquis  currently held with anemia (hemoglobin currently in the low 7s)   Otherwise per primary  - Acute on chronic anemia  - Acute T6 fracture, deconditioning  - R pleural effusion     For questions or updates, please contact [facility] Please consult [email or web address] for contact info under         Signed, Rollo FABIENE Louder, PA-C     Patient seen and examined, note reviewed with the signed Advanced Practice Provider. I personally reviewed laboratory data,  imaging studies and relevant notes. I independently examined the patient and formulated the important aspects of the plan. I have personally discussed the plan with the patient and/or family. Comments or changes to the note/plan are indicated below.  Patient profile: This a 73 year old male with history of CKD 4, valvular heart disease, VT/VF s/p ICD, chronic biventricular heart failure, paroxysmal atrial fibrillation who was admitted with acute T6 fracture.  He is required intermittent milrinone  this hospitalization and is currently admitted with cardiorenal syndrome and undergoing HD intermittently.  My Exam:  Physical Exam Vitals and nursing note reviewed.  Constitutional:       Appearance: Normal appearance.  HENT:     Head: Normocephalic and atraumatic.  Eyes:     Conjunctiva/sclera: Conjunctivae normal.  Cardiovascular:     Rate and Rhythm: Normal rate and regular rhythm.  Pulmonary:     Effort: Pulmonary effort is normal.     Breath sounds: Normal breath sounds.  Musculoskeletal:     Comments: 4+ bilateral lower extremity pitting edema Hands are cool to touch  Skin:    Coloration: Skin is not jaundiced or pale.  Neurological:     Mental Status: He is alert.      Assessment & Plan:  Cardiorenal syndrome Nephrology managing Acute on chronic systolic heart failure with history of biventricular failure and volume overload-earlier this hospitalization required milrinone  but is currently not on inotropes.  His Coox this morning was 59% which is down from 90% a few days ago.  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 Will consult our advanced HF colleagues. Continue to trend COOX

## 2024-09-01 NOTE — Progress Notes (Signed)
 Physical Therapy Treatment Patient Details Name: Dean Zeiss Sr. MRN: [id number] DOB: 24-May-1951 Today's Date: 09/01/2024   History of Present Illness 73 yo male s/p fall down 8 stairs in wheelchair. CT (+) T6 vertebral body fx, neurosurgery states no plan for surgery but needs TLSO brace. S/p R thoracentesis 11/28 yielding 500 mL bloody fluid. PMH AICD, arthritis, HLD, HFrEF, ICD, PAF on Eliquis , HTN, CAD, HLD, OSA on cpap, T2DM, CKD, HTN, laminectomy, cardiac arrest (2017).    PT Comments  Pt with c/o of feeling fatigued this date. Pt requiring 3 standing rest breaks during 120' of amb with RW. Pt with noted telemetry alerts, RN came to assess patient. Acute PT to cont to follow.    If plan is discharge home, recommend the following: A little help with walking and/or transfers;A little help with bathing/dressing/bathroom;Assistance with cooking/housework;Help with stairs or ramp for entrance   Can travel by private vehicle        Equipment Recommendations  Rolling walker (2 wheels)    Recommendations for Other Services       Precautions / Restrictions Precautions Precautions: Fall;Back Recall of Precautions/Restrictions: Intact Required Braces or Orthoses: Spinal Brace Spinal Brace: Thoracolumbosacral orthotic Restrictions Weight Bearing Restrictions Per Provider Order: No     Mobility  Bed Mobility               General bed mobility comments: pt received sitting up on EOB finishing lunch    Transfers Overall transfer level: Needs assistance Equipment used: Rolling walker (2 wheels) Transfers: Sit to/from Stand Sit to Stand: Contact guard assist           General transfer comment: cues for improved safety and use of hands, increased time    Ambulation/Gait Ambulation/Gait assistance: Contact guard assist Gait Distance (Feet): 120 Feet Assistive device: Rolling walker (2 wheels) Gait Pattern/deviations: Step-through pattern, Decreased stride length,  Trunk flexed Gait velocity: decr     General Gait Details: still cuing for proximity to the RW and posture, pt with 3 standing rest breaks stating ooo I'm tried today. Pt with noted telemtry alarms, RN aware   Stairs             Wheelchair Mobility     Tilt Bed    Modified Rankin (Stroke Patients Only)       Balance Overall balance assessment: Needs assistance Sitting-balance support: No upper extremity supported, Feet supported Sitting balance-Leahy Scale: Fair     Standing balance support: Bilateral upper extremity supported Standing balance-Leahy Scale: Fair Standing balance comment: able to let go of RW and maintain balance while he adjusts brace before walking                            Communication Communication Communication: No apparent difficulties Factors Affecting Communication:  (slow processing)  Cognition Arousal: Alert Behavior During Therapy: WFL for tasks assessed/performed   PT - Cognitive impairments: Problem solving, Safety/Judgement                       PT - Cognition Comments: difficulty sequencing donning/doffing the brace. reports feeling depressed Following commands: Intact Following commands impaired: Follows one step commands with increased time    Cueing Cueing Techniques: Verbal cues  Exercises      General Comments General comments (skin integrity, edema, etc.): Pt reports not feeling great when returning, telemetry was alarming, RN came to check on patient  Pertinent Vitals/Pain Pain Assessment Pain Assessment: Faces Faces Pain Scale: Hurts a little bit Pain Location: R side back Pain Descriptors / Indicators: Guarding, Discomfort    Home Living                          Prior Function            PT Goals (current goals can now be found in the care plan section) Acute Rehab PT Goals Patient Stated Goal: return home PT Goal Formulation: With patient Time For Goal Achievement:  09/12/24 Potential to Achieve Goals: Good Progress towards PT goals: Progressing toward goals    Frequency    Min 2X/week      PT Plan      Co-evaluation              AM-PAC PT 6 Clicks Mobility   Outcome Measure  Help needed turning from your back to your side while in a flat bed without using bedrails?: A Little Help needed moving from lying on your back to sitting on the side of a flat bed without using bedrails?: A Lot Help needed moving to and from a bed to a chair (including a wheelchair)?: A Little Help needed standing up from a chair using your arms (e.g., wheelchair or bedside chair)?: A Little Help needed to walk in hospital room?: A Little Help needed climbing 3-5 steps with a railing? : A Lot 6 Click Score: 16    End of Session Equipment Utilized During Treatment: Back brace Activity Tolerance: Patient tolerated treatment well Patient left: in chair;with call bell/phone within reach;with bed alarm set Nurse Communication: Mobility status PT Visit Diagnosis: Other abnormalities of gait and mobility (R26.89);Muscle weakness (generalized) (M62.81)     Time: 8755-8693 PT Time Calculation (min) (ACUTE ONLY): 22 min  Charges:    $Gait Training: 8-22 mins PT General Charges $$ ACUTE PT VISIT: 1 Visit                     Norene Ames, PT, DPT Acute Rehabilitation Services Secure chat preferred Office #: [phone]    Norene CHRISTELLA Ames 09/01/2024, 2:42 PM

## 2024-09-02 DIAGNOSIS — I255 Ischemic cardiomyopathy: Secondary | ICD-10-CM | POA: Diagnosis not present

## 2024-09-02 LAB — COOXEMETRY PANEL
Carboxyhemoglobin: 1.5 % (ref 0.5–1.5)
Methemoglobin: <0.7 % (ref 0.0–1.5)
O2 Saturation: 89.3 %
Total hemoglobin: 7.2 g/dL — ABNORMAL LOW (ref 12.0–16.0)

## 2024-09-02 LAB — RENAL FUNCTION PANEL
Albumin: 3 g/dL — ABNORMAL LOW (ref 3.5–5.0)
Anion gap: 15 (ref 5–15)
BUN: 38 mg/dL — ABNORMAL HIGH (ref 8–23)
CO2: 20 mmol/L — ABNORMAL LOW (ref 22–32)
Calcium: 9 mg/dL (ref 8.9–10.3)
Chloride: 97 mmol/L — ABNORMAL LOW (ref 98–111)
Creatinine, Ser: 2.48 mg/dL — ABNORMAL HIGH (ref 0.61–1.24)
GFR, Estimated: 27 mL/min — ABNORMAL LOW (ref >60)
Glucose, Bld: 92 mg/dL (ref 70–99)
Phosphorus: 3.1 mg/dL (ref 2.5–4.6)
Potassium: 3.7 mmol/L (ref 3.5–5.1)
Sodium: 132 mmol/L — ABNORMAL LOW (ref 135–145)

## 2024-09-02 LAB — CBC
HCT: 22.6 % — ABNORMAL LOW (ref 39.0–52.0)
Hemoglobin: 7.1 g/dL — ABNORMAL LOW (ref 13.0–17.0)
MCH: 29.7 pg (ref 26.0–34.0)
MCHC: 31.4 g/dL (ref 30.0–36.0)
MCV: 94.6 fL (ref 80.0–100.0)
Platelets: 147 K/uL — ABNORMAL LOW (ref 150–400)
RBC: 2.39 MIL/uL — ABNORMAL LOW (ref 4.22–5.81)
RDW: 15.8 % — ABNORMAL HIGH (ref 11.5–15.5)
WBC: 7.2 K/uL (ref 4.0–10.5)
nRBC: 0.3 % — ABNORMAL HIGH (ref 0.0–0.2)

## 2024-09-02 LAB — MAGNESIUM: Magnesium: 1.8 mg/dL (ref 1.7–2.4)

## 2024-09-02 MED ORDER — FUROSEMIDE 10 MG/ML IJ SOLN
160.0000 mg | Freq: Two times a day (BID) | INTRAVENOUS | Status: AC
  Administered 2024-09-02 (×2): 160 mg via INTRAVENOUS
  Filled 2024-09-02: qty 10
  Filled 2024-09-02: qty 16

## 2024-09-02 MED ORDER — DOCUSATE SODIUM 50 MG PO CAPS
50.0000 mg | ORAL_CAPSULE | Freq: Once | ORAL | Status: AC
  Administered 2024-09-02: 50 mg via ORAL
  Filled 2024-09-02: qty 1

## 2024-09-02 MED ORDER — DARBEPOETIN ALFA 150 MCG/0.3ML IJ SOSY
150.0000 ug | PREFILLED_SYRINGE | INTRAMUSCULAR | Status: DC
  Administered 2024-09-02 – 2024-09-09 (×2): 150 ug via SUBCUTANEOUS
  Filled 2024-09-02 (×2): qty 0.3

## 2024-09-02 MED ORDER — DOBUTAMINE-DEXTROSE 4-5 MG/ML-% IV SOLN
5.0000 ug/kg/min | INTRAVENOUS | Status: DC
  Administered 2024-09-02 – 2024-09-08 (×3): 2.5 ug/kg/min via INTRAVENOUS
  Administered 2024-09-11: 08:00:00 5 ug/kg/min via INTRAVENOUS
  Administered 2024-09-12: 2.5 ug/kg/min via INTRAVENOUS
  Filled 2024-09-02 (×5): qty 250

## 2024-09-02 MED ORDER — HEPARIN SODIUM (PORCINE) 1000 UNIT/ML IJ SOLN
INTRAMUSCULAR | Status: AC
  Filled 2024-09-02: qty 4

## 2024-09-02 NOTE — Progress Notes (Signed)
 PROGRESS NOTE  Dean Croft Sr. FMW:[id number] DOB: [birth year] DOA: 08/22/2024 PCP: [name], MD   LOS: 11 days   Brief narrative:  Patient is 73 years old male with past medical history of  CAD, PAF, ICM, cardiac arrest s/p AICD, HFrEF (EF 35-40% in 04/2024), hypertension, hyperlipidemia, type 2 diabetes, obstructive sleep apnea and recent admission on 07/16/24 for HFrEF exacerbation presented to hospital with back pain after falling from the wheelchair.  He was noted to have acute kidney injury and T6 fracture.  Has been having lower extremity edema as well.  Patient's hospital stay was complicated with heart failure with worsening renal function and cardiology and nephrology were consulted.  Patient had hemodialysis initiated for volume management and underwent thoracocentesis on 11-28 and 12- 5 for worsening right pleural effusion.  Also required IV iron .  At this time, patient has been followed by cardiology and is on milrinone  drip and undergoing hemodialysis for volume management.      Assessment/Plan: Principal Problem:   Multiple traumatic injuries Active Problems:   Cardiorenal syndrome with renal failure   Cardiorenal syndrome  Cardiorenal syndrome with renal failure / Low output biventricular heart failure - Cardiology following.  Last 2D echocardiogram with EF of 35 to 40%.  Cardiac catheterization 06/19/24 showed moderate pulmonary hypertension and low cardiac output.  Patient was initially on milrinone  drip with cardiology which was subsequently discontinued but at this time has been restarted due to worsening cooximeter..  Fluid management currently with dialysis.  Cardiology on board.  Received 80 mg of IV Lasix  yesterday and received a TEE on 09/01/2024.  Plan for hemodialysis 12/ 9/25.  Continue strict intake and output charting.  Patient had 450 mL urinary output in the last 24 hours.    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 Status post hemodialysis catheter placement and first  hemodialysis on 08/28/2024. Follow nephrology recommendation.  Continue intake and output, check daily weights.  Patient follows up with Dr. [name] as outpatient.  Plan for hemodialysis 09/02/2024.   Acute on chronic anemia -likely from CKD and iron  deficiency.  Received IV iron . Latest hemoglobin of 7.1 from 7.5.  Status post Aranesp    Acute T6 fracture, physical deconditioning: Neurosurgery was consulted.  Recommended TSLO brace and outpatient follow-up with neurosurgery Dr. [name].  Continue PT, OT while in the hospital.   R pleural effusion: RML/RLL consolidation Patient underwent IR guided thoracentesis on 11/28 with removal of 500 mL of bloody fluid.  Patient also had repeat thoracentesis on 12/5, with removal of 1.5L of bloody fluid.  Currently room air and remained stable.   Hyperkalemia -resolved on hemodialysis.  Latest potassium of 3.7   Liver enhancement on imaging: Could be secondary to amiodarone .   PAF: Patient on amiodarone  200mg  daily.  Patient was on Eliquis  5mg  BID (currently held) due to anemia.  DVT prophylaxis: heparin  injection 5,000 Units Start: 08/27/24 1615   Disposition: Likely home with home health likely in few days.  Status is: Inpatient  Remains inpatient appropriate because: Need for hemodialysis, pending clinical improvement, on milrinone  drip.    Code Status:     Code Status: Limited: Do not attempt resuscitation (DNR) -DNR-LIMITED -Do Not Intubate/DNI   Family Communication: Spoke with the patient's daughter 08/31/2024  Consultants: Nephrology Cardiology Trauma surgery  Procedures: Thoracocentesis Hemodialysis catheter placement Right upper extremity PICC line in place  Anti-infectives:  None  Anti-infectives (From admission, onward)    Start     Dose/Rate Route Frequency Ordered Stop   08/28/24 1345  ceFAZolin  (ANCEF ) IVPB 2g/100 mL premix        2 g 200 mL/hr over 30 Minutes Intravenous To Radiology 08/28/24 1247 08/28/24 1545        Subjective: Today, patient was seen and examined at bedside.  Patient denies any pain, nausea, vomiting or shortness of breath or overt dyspnea  Objective: Vitals:   09/02/24 0750 09/02/24 0802  BP: (!) 129/53   Pulse: 73 68  Resp: 20 19  Temp: 98.1 F (36.7 C)   SpO2: 97% 99%    Intake/Output Summary (Last 24 hours) at 09/02/2024 0925 Last data filed at 09/02/2024 9361 Gross per 24 hour  Intake 542.31 ml  Output 450 ml  Net 92.31 ml   Filed Weights   08/31/24 0500 09/01/24 0500 09/02/24 0500  Weight: 90 kg 90.8 kg 92.5 kg   Body mass index is 32.91 kg/m.   Physical Exam: General: Obese built, not in obvious distress, on nasal cannula HENT:   No scleral pallor or icterus noted. Oral mucosa is moist.  Chest:   Diminished breath sounds bilaterally.  Right chest wall hemodialysis catheter CVS: S1 &S2 heard. No murmur.  Regular rate and rhythm. Abdomen: Soft, nontender, nondistended.  Bowel sounds are heard.   Extremities: No cyanosis, clubbing with bilateral lower extremity pitting edema.  Right upper extremity PICC line in place.  Right leg with dressing. Psych: Alert, awake and oriented, normal mood CNS:  No cranial nerve deficits.  Moves all extremities Skin: Warm and dry.  Right leg with dressing.   Data Review: I have personally reviewed the following laboratory data and studies,  CBC: Recent Labs  Lab 08/27/24 0431 08/28/24 0935 08/29/24 1741 09/01/24 0558 09/02/24 0500  WBC 8.8 5.9  --  6.2 7.2  HGB 7.4* 7.1* 7.5* 7.2* 7.1*  HCT 23.5* 21.7* 22.0* 22.4* 22.6*  MCV 92.2 92.7  --  92.9 94.6  PLT 184 176  --  126* 147*   Basic Metabolic Panel: Recent Labs  Lab 08/28/24 0617 08/29/24 0612 08/29/24 1741 08/30/24 0555 08/31/24 0830 09/01/24 0558 09/02/24 0500  NA 130* 132* 131* 135 135 134* 132*  K 5.5* 4.6 4.7 3.7 3.9 3.9 3.7  CL 100 96*  --  98 97* 97* 97*  CO2 21* 25  --  25 26 25  20*  GLUCOSE 94 106*  --  125* 86 89 92  BUN 91* 65*  --   41* 29* 33* 38*  CREATININE 5.07* 3.52*  --  2.54* 2.38* 2.50* 2.48*  CALCIUM  9.5 9.6  --  9.1 9.3 9.5 9.0  MG 1.9 1.9  --  1.9  --  1.8 1.8  PHOS  --   --   --  2.5 2.7 3.2 3.1   Liver Function Tests: Recent Labs  Lab 08/31/24 0830 09/01/24 0558 09/02/24 0500  ALBUMIN  3.4* 3.2* 3.0*   No results for input(s): LIPASE, AMYLASE in the last 168 hours. No results for input(s): AMMONIA in the last 168 hours. Cardiac Enzymes: No results for input(s): CKTOTAL, CKMB, CKMBINDEX, TROPONINI in the last 168 hours. BNP (last 3 results) Recent Labs    06/25/24 1604 07/10/24 0227 08/22/24 0903  BNP 1,585.3* 1,952.1* 525.0*    ProBNP (last 3 results) No results for input(s): PROBNP in the last 8760 hours.  CBG: No results for input(s): GLUCAP in the last 168 hours. No results found for this or any previous visit (from the past 240 hours).    Studies: No results found.  Zaveon Gillen, MD  [facility] 09/02/2024  If 7PM-7AM, please contact night-coverage

## 2024-09-02 NOTE — Progress Notes (Addendum)
   09/02/24 0208  Vitals  Temp 97.8 F (36.6 C)  Temp Source Oral  BP (!) 136/55  MAP (mmHg) 76  BP Location Left Arm  BP Method Automatic  Patient Position (if appropriate) Lying  Pulse Rate 73  Pulse Rate Source Monitor  ECG Heart Rate 73  Resp (!) 22  Level of Consciousness  Level of Consciousness Alert  MEWS COLOR  MEWS Score Color Green  Oxygen Therapy  SpO2 90 %  O2 Device Nasal Cannula  O2 Flow Rate (L/min) 2 L/min  Pain Assessment  Pain Scale 0-10  Pain Score 0  MEWS Score  MEWS Temp 0  MEWS Systolic 0  MEWS Pulse 0  MEWS RR 1  MEWS LOC 0  MEWS Score 1   Patient continues on Milrinone  gtt; total hgb 7.2 this morning.

## 2024-09-02 NOTE — Plan of Care (Signed)
  Problem: Education: Goal: Knowledge of General Education information will improve Description: Including pain rating scale, medication(s)/side effects and non-pharmacologic comfort measures 09/02/2024 0352 by Margrette Reine POUR, RN Outcome: Progressing 09/02/2024 0350 by Margrette Reine POUR, RN Outcome: Progressing   Problem: Health Behavior/Discharge Planning: Goal: Ability to manage health-related needs will improve 09/02/2024 0352 by Margrette Reine POUR, RN Outcome: Progressing 09/02/2024 0350 by Margrette Reine POUR, RN Outcome: Progressing   Problem: Clinical Measurements: Goal: Ability to maintain clinical measurements within normal limits will improve 09/02/2024 0352 by Margrette Reine POUR, RN Outcome: Progressing 09/02/2024 0350 by Margrette Reine POUR, RN Outcome: Progressing Goal: Will remain free from infection 09/02/2024 0352 by Margrette Reine POUR, RN Outcome: Progressing 09/02/2024 0350 by Margrette Reine POUR, RN Outcome: Progressing Goal: Diagnostic test results will improve 09/02/2024 0352 by Margrette Reine POUR, RN Outcome: Progressing 09/02/2024 0350 by Margrette Reine POUR, RN Outcome: Progressing Goal: Respiratory complications will improve 09/02/2024 0352 by Margrette Reine POUR, RN Outcome: Progressing 09/02/2024 0350 by Margrette Reine POUR, RN Outcome: Progressing Goal: Cardiovascular complication will be avoided 09/02/2024 0352 by Kelyn Koskela K, RN Outcome: Progressing 09/02/2024 0350 by Margrette Reine POUR, RN Outcome: Progressing   Problem: Activity: Goal: Risk for activity intolerance will decrease 09/02/2024 0352 by Margrette Reine POUR, RN Outcome: Progressing 09/02/2024 0350 by Margrette Reine POUR, RN Outcome: Progressing   Problem: Nutrition: Goal: Adequate nutrition will be maintained 09/02/2024 0352 by Margrette Reine POUR, RN Outcome: Progressing 09/02/2024 0350 by Margrette Reine POUR, RN Outcome: Progressing   Problem: Coping: Goal: Level of anxiety will decrease 09/02/2024  0352 by Margrette Reine POUR, RN Outcome: Progressing 09/02/2024 0350 by Margrette Reine POUR, RN Outcome: Progressing   Problem: Elimination: Goal: Will not experience complications related to bowel motility 09/02/2024 0352 by Margrette Reine POUR, RN Outcome: Progressing 09/02/2024 0350 by Margrette Reine POUR, RN Outcome: Progressing Goal: Will not experience complications related to urinary retention 09/02/2024 0352 by Margrette Reine POUR, RN Outcome: Progressing 09/02/2024 0350 by Margrette Reine POUR, RN Outcome: Progressing   Problem: Pain Managment: Goal: General experience of comfort will improve and/or be controlled 09/02/2024 0352 by Margrette Reine POUR, RN Outcome: Progressing 09/02/2024 0350 by Margrette Reine POUR, RN Outcome: Progressing   Problem: Safety: Goal: Ability to remain free from injury will improve 09/02/2024 0352 by Margrette Reine POUR, RN Outcome: Progressing 09/02/2024 0350 by Margrette Reine POUR, RN Outcome: Progressing   Problem: Skin Integrity: Goal: Risk for impaired skin integrity will decrease 09/02/2024 0352 by Margrette Reine POUR, RN Outcome: Progressing 09/02/2024 0350 by Margrette Reine POUR, RN Outcome: Progressing   Problem: Education: Goal: Knowledge of disease and its progression will improve Outcome: Progressing   Problem: Fluid Volume: Goal: Compliance with measures to maintain balanced fluid volume will improve Outcome: Progressing   Problem: Health Behavior/Discharge Planning: Goal: Ability to manage health-related needs will improve Outcome: Progressing   Problem: Clinical Measurements: Goal: Complications related to the disease process, condition or treatment will be avoided or minimized Outcome: Progressing

## 2024-09-02 NOTE — Progress Notes (Signed)
 Occupational Therapy Treatment Patient Details Name: Dean [name]. MRN: [id number] DOB: 03-Sep-1951 Today's Date: 09/02/2024   History of present illness 73 yo male s/p fall down 8 stairs in wheelchair. CT (+) T6 vertebral body fx, neurosurgery states no plan for surgery but needs TLSO brace. S/p R thoracentesis 11/28 yielding 500 mL bloody fluid. HD initiated 12/4. PMH AICD, arthritis, HLD, HFrEF, ICD, PAF on Eliquis , HTN, CAD, HLD, OSA on cpap, T2DM, CKD, HTN, laminectomy, cardiac arrest (2017).   OT comments  Pt is making limited progress towards their acute OT goals. Pt reports feel extremely fatigued this date. He required increased time for all tasks and was limited to in-room mobility only. Generalized supervision A with RW provided for safety. Began education on energy conservation, pt was appreciative. OT to continue to follow acutely to facilitate progress towards established goals. Pt will continue to benefit from [facility].       If plan is discharge home, recommend the following:  A little help with walking and/or transfers;A lot of help with bathing/dressing/bathroom;Assistance with cooking/housework;Direct supervision/assist for medications management;Assist for transportation;Help with stairs or ramp for entrance   Equipment Recommendations  Other (comment)       Precautions / Restrictions Precautions Precautions: Fall;Back Recall of Precautions/Restrictions: Intact Required Braces or Orthoses: Spinal Brace Spinal Brace: Thoracolumbosacral orthotic Restrictions Weight Bearing Restrictions Per Provider Order: No       Mobility Bed Mobility Overal bed mobility: Needs Assistance Bed Mobility: Rolling, Sidelying to Sit Rolling: Supervision Sidelying to sit: Supervision       General bed mobility comments: increased time needed due to fatigue    Transfers Overall transfer level: Needs assistance Equipment used: Rolling walker (2 wheels) Transfers: Sit to/from  Stand Sit to Stand: Supervision           General transfer comment: pt reported feeling of extreme fatigue today and needed increased time for limited in-room mobility     Balance Overall balance assessment: Needs assistance Sitting-balance support: No upper extremity supported, Feet supported Sitting balance-Leahy Scale: Fair     Standing balance support: Bilateral upper extremity supported Standing balance-Leahy Scale: Fair                             ADL either performed or assessed with clinical judgement   ADL Overall ADL's : Needs assistance/impaired Eating/Feeding: Independent                       Toilet Transfer: Supervision/safety;Ambulation;Rolling walker (2 wheels) Toilet Transfer Details (indicate cue type and reason): limited by fatigue and activity tolerance         Functional mobility during ADLs: Supervision/safety;Rolling walker (2 wheels) General ADL Comments: pt continues to move well but need significantly increased time for energyc conservation    Extremity/Trunk Assessment Upper Extremity Assessment Upper Extremity Assessment: Overall WFL for tasks assessed;Generalized weakness   Lower Extremity Assessment Lower Extremity Assessment: Defer to PT evaluation        Vision   Vision Assessment?: No apparent visual deficits   Perception Perception Perception: Not tested   Praxis Praxis Praxis: Not tested   Communication Communication Communication: No apparent difficulties   Cognition Arousal: Alert Behavior During Therapy: WFL for tasks assessed/performed Cognition: No apparent impairments             OT - Cognition Comments: pt reports it is getting hard for him mentally and emotionally being in the hospital  Following commands: Intact        Cueing   Cueing Techniques: Verbal cues        General Comments VSS    Pertinent Vitals/ Pain       Pain Assessment Pain Assessment:  Faces Faces Pain Scale: Hurts a little bit Pain Location: R side back Pain Descriptors / Indicators: Guarding, Discomfort Pain Intervention(s): Limited activity within patient's tolerance, Monitored during session   Frequency  Min 2X/week        Progress Toward Goals  OT Goals(current goals can now be found in the care plan section)  Progress towards OT goals: Progressing toward goals  Acute Rehab OT Goals Patient Stated Goal: to get better OT Goal Formulation: With patient Time For Goal Achievement: 09/05/24 Potential to Achieve Goals: Good ADL Goals Pt Will Perform Lower Body Dressing: with modified independence;sit to/from stand Pt Will Transfer to Toilet: with modified independence;ambulating Additional ADL Goal #1: Pt will indep follow spinal precautions during ADLs   AM-PAC OT 6 Clicks Daily Activity     Outcome Measure   Help from another person eating meals?: None Help from another person taking care of personal grooming?: A Little Help from another person toileting, which includes using toliet, bedpan, or urinal?: A Little Help from another person bathing (including washing, rinsing, drying)?: A Lot Help from another person to put on and taking off regular upper body clothing?: A Little Help from another person to put on and taking off regular lower body clothing?: A Lot 6 Click Score: 17    End of Session Equipment Utilized During Treatment: Back brace;Other (comment)  OT Visit Diagnosis: Unsteadiness on feet (R26.81);Other abnormalities of gait and mobility (R26.89);Muscle weakness (generalized) (M62.81);History of falling (Z91.81);Pain   Activity Tolerance Patient tolerated treatment well   Patient Left in bed;with call bell/phone within reach;with bed alarm set;with nursing/sitter in room;with family/visitor present   Nurse Communication Other (comment)        Time: 8787-8768 OT Time Calculation (min): 19 min  Charges: OT General Charges $OT  Visit: 1 Visit OT Treatments $Self Care/Home Management : 8-22 mins  [name], OTR/L Acute Rehabilitation Services Office [phone] Secure Chat Communication Preferred   [name] 09/02/2024, 3:28 PM

## 2024-09-02 NOTE — Plan of Care (Signed)

## 2024-09-02 NOTE — Consult Note (Addendum)
 Advanced Heart Failure Team Consult Note   Primary Physician: [name], MD Cardiologist:  Vinie JAYSON Maxcy, MD  Reason for Consultation: End-stage HFrEF HPI:    Dean Strine Sr. is seen today for evaluation of end stage HF at the request of Dr. Adair.   Dean Mcqueary Sr. is a 73 y.o. with chronic systolic heart failure due to ICM. Cardiac history dates back to 2017 when he suffered VT/VF arrest requiring CPR/defibrillation. Cardiac cath demonstrated 90% ostial RCA and 100% m to d RCA with collaterals. CAD treated medically. Echo with LVEF 35-40% and AK of basal inferior and apical myocardium. Later underwent single chamber medtronic ICD in 09/2016. Since then EF has been in range of 30-35%.    Admitted in 12/23 d/t ICD shock for VT/VF in setting of amiodarone  noncompliance.   He was admitted in 2/25 with GI bleed requiring transfusions after polypectomy by hot snare with clips.   He was admitted 7/25 with acute on chronic CHF. He was diuresed with IV lasix .    Admitted 8/25 w/ low output HF. RHC showed severely elevated filling pressures and reduced cardiac index. Required IV milrinone  with improvement, weaned off with aggressive diuresis prior to discharge.    Outpatient TEE 9/25 to assess MV showed moderate MR by PISA method with systolic blunting of the pulmonary veins, no reversal, short posterior leaflet, poor coaptation, moderately reduced LVEF, No LAA thrombus, severe lead related TR.   He was admitted again 10/25 with low output HF. Required IV milrinone  and diuresis. CT scan also showed incidental finding of new pulmonary nodule and mediastinal lymphadenopathy concerning for metastatic disease. He was seen by palliative care for end-stage heart failure and elected for DNR. Outpatient palliative care was recommended.   Presented to [facility] 08/22/24 after falling from wheelchair. He was founf to have an AKI and T6 fracture. Neurosurgery consulted and recommended TLSO and  outpatient follow up.  CXR 11/28 with moderate right pleural effusion s/p thoracentesis (bloody) Eliquis  was held. Pleural effusion unchanged on repeat CXR 11/30. Worsening Cr 2.83>3.77>3.78 torsemide  has been held. Cardiology was consulted. He was started on IV Lasix  and milrinone  for co-ox 59%, however he developed hypotension and anuria. Lasix  and milrinone  were stopped. He was started on HD 08/29/24. However co-ox fell again to 48% on 09/01/24 and milrinone  restarted. AHF was consulted for evaluation for home milrinone .  Today co-ox is 89% on milrinone  0.25. BP stable. Made 450cc urine overnight with lasix . Hgb 7.1 Feeling well, no shortness of breath. Pain is improved in back, some in leg.   Home Medications Prior to Admission medications   Medication Sig Start Date End Date Taking? Authorizing Provider  allopurinol  (ZYLOPRIM ) 300 MG tablet Take 900 mg by mouth daily. 06/25/24  Yes [provider]  amiodarone  (PACERONE ) 200 MG tablet Take 1 tablet (200 mg total) by mouth daily. 09/06/22  Yes Leverne Charlies Helling, PA-C  apixaban  (ELIQUIS ) 5 MG TABS tablet TAKE 1 TABLET BY MOUTH TWICE A DAY 05/01/24  Yes Camnitz, Will Gladis, MD  atorvastatin  (LIPITOR) 40 MG tablet Take 40 mg by mouth daily.   Yes [provider]  cholecalciferol  (VITAMIN D3) 25 MCG (1000 UNIT) tablet Take 1,000 Units by mouth daily. 06/13/23  Yes [provider]  ferrous sulfate  325 (65 FE) MG tablet Take 325 mg by mouth daily. 11/08/23  Yes [provider]  furosemide  (LASIX ) 20 MG tablet Take 20 mg by mouth daily. 07/28/24  Yes [provider]  furosemide  (  LASIX ) 40 MG tablet Take 40 mg by mouth daily. 07/29/24  Yes [provider]  olmesartan (BENICAR) 20 MG tablet Take 20 mg by mouth daily. 08/21/24  Yes [provider]  polyethylene glycol powder (GLYCOLAX /MIRALAX ) 17 GM/SCOOP powder Take 17 g by mouth daily. Patient taking differently: Take 17 g by mouth daily as needed  for mild constipation. 06/25/24  Yes Desiderio Chew, PA-C  potassium chloride  SA (KLOR-CON  M) 20 MEQ tablet Take 1 tablet (20 mEq total) by mouth daily. 06/25/24  Yes Desiderio Chew, PA-C  zolpidem  (AMBIEN ) 5 MG tablet Take 5 mg by mouth at bedtime. 07/28/24  Yes [provider]  tiZANidine (ZANAFLEX) 2 MG tablet Take 2 mg by mouth 3 (three) times daily. Patient not taking: Reported on 08/23/2024 08/20/24   [provider]    Past Medical History: Past Medical History:  Diagnosis Date   AICD (automatic cardioverter/defibrillator) present 10/10/2016   a. 09/2016 s/p MDT Visia AF MRI single lead ICD (ser # [id number]).   Arthritis    maybe in his spine (09/05/2016)   Asthma    pt. denies   Cardiac arrest (HCC)    a. 08/2016 VT/VF Arrest-->09/2016 s/p MDT Visia AF MRI single lead ICD (ser # [id number]).   Chronic HFrEF (heart failure with reduced ejection fraction) (HCC)    a. 08/2016 Echo: EF 35-40%; b. 04/2018 Echo: EF 30-35%; c. 04/2020 Echo: EF 30-35%; d. 11/2021 Echo: EF 40-45%, GrI DD, nl RV fxn, sev dil LA, mild-mod MR, mild-mod AI, Ao root 38mm.   Coronary artery disease    a. 08/2016 VT Arrest/Cath: LM nl, LAD nl, LCX nl, RGCA 90p, 100d w/ L->R collats, EF 35-45%.   Gout    Hyperlipidemia LDL goal <70    Hypertension    Ischemic cardiomyopathy    a. 08/2016 Echo: EF 35-40%; b. 04/2018 Echo: EF 30-35%; c. 04/2020 Echo: EF 30-35%; d. 11/2021 Echo: EF 40-45%.   LBBB (left bundle branch block)    OSA on CPAP    does not wear CPAP   PAF (paroxysmal atrial fibrillation) (HCC)    a. CHA2DS2VASc = 5-->Eliquis .   Type II diabetes mellitus (HCC)     Past Surgical History: Past Surgical History:  Procedure Laterality Date   CARDIAC CATHETERIZATION N/A 09/07/2016   Procedure: Left Heart Cath and Coronary Angiography;  Surgeon: Peter M Jordan, MD;  Location: [facility] INVASIVE CV LAB;  Service: Cardiovascular;  Laterality: N/A;   CENTRAL LINE INSERTION  05/06/2024   Procedure:  CENTRAL LINE INSERTION;  Surgeon: Cherrie Toribio SAUNDERS, MD;  Location: [facility] INVASIVE CV LAB;  Service: Cardiovascular;;   COLONOSCOPY WITH PROPOFOL  N/A 08/30/2018   Procedure: COLONOSCOPY WITH PROPOFOL ;  Surgeon: Rollin Dover, MD;  Location: [facility] ENDOSCOPY;  Service: Endoscopy;  Laterality: N/A;   COLONOSCOPY WITH PROPOFOL  N/A 11/16/2023   Procedure: COLONOSCOPY WITH PROPOFOL ;  Surgeon: Rollin Dover, MD;  Location: [facility]COPY;  Service: Gastroenterology;  Laterality: N/A;   COLONOSCOPY WITH PROPOFOL  N/A 11/18/2023   Procedure: COLONOSCOPY WITH PROPOFOL ;  Surgeon: Charlanne Groom, MD;  Location: [facility]OS[facility];  Service: Gastroenterology;  Laterality: N/A;   EP IMPLANTABLE DEVICE N/A 10/10/2016   Procedure: ICD Implant;  Surgeon: Will Gladis Norton, MD;  Location: [facility] INVASIVE CV LAB;  Service: Cardiovascular;  Laterality: N/A;   HEMOSTASIS CLIP PLACEMENT  11/16/2023   Procedure: HEMOSTASIS CLIP PLACEMENT;  Surgeon: Rollin Dover, MD;  Location: [facility];  Service: Gastroenterology;;   HEMOSTASIS CLIP PLACEMENT  11/18/2023   Procedure: HEMOSTASIS CLIP  PLACEMENT;  Surgeon: Charlanne Groom, MD;  Location: [facility];  Service: Gastroenterology;;   HEMOSTASIS CONTROL  11/18/2023   Procedure: HEMOSTASIS CONTROL;  Surgeon: Charlanne Groom, MD;  Location: [facility];  Service: Gastroenterology;;  PuraStat   IR THORACENTESIS ASP PLEURAL SPACE W/IMG GUIDE  08/22/2024   IR THORACENTESIS ASP PLEURAL SPACE W/IMG GUIDE  08/29/2024   IR TUNNELED CENTRAL VENOUS CATH PLC W IMG  08/28/2024   LUMBAR LAMINECTOMY/DECOMPRESSION MICRODISCECTOMY N/A 01/18/2018   Procedure: L2-3, L3-4, L-4-5 CENTRAL DECOMPRESSION;  Surgeon: Barbarann Oneil BROCKS, MD;  Location: [facility] OR;  Service: Orthopedics;  Laterality: N/A;   LUMBAR LAMINECTOMY/DECOMPRESSION MICRODISCECTOMY N/A 08/12/2020   Procedure: THORACIC ELEVEN LAMINECTOMY;  Surgeon: Louis Shove, MD;  Location: [facility] OR;  Service: Neurosurgery;  Laterality: N/A;   POLYPECTOMY  08/30/2018    Procedure: POLYPECTOMY;  Surgeon: Rollin Dover, MD;  Location: [facility];  Service: Endoscopy;;   POLYPECTOMY  11/16/2023   Procedure: POLYPECTOMY;  Surgeon: Rollin Dover, MD;  Location: [facility];  Service: Gastroenterology;;   RIGHT HEART CATH N/A 05/06/2024   Procedure: RIGHT HEART CATH;  Surgeon: Cherrie Toribio SAUNDERS, MD;  Location: [facility] INVASIVE CV LAB;  Service: Cardiovascular;  Laterality: N/A;   TRANSESOPHAGEAL ECHOCARDIOGRAM (CATH LAB) N/A 06/09/2024   Procedure: TRANSESOPHAGEAL ECHOCARDIOGRAM;  Surgeon: [name] Morene PARAS, MD;  Location: [facility] INVASIVE CV LAB;  Service: Cardiovascular;  Laterality: N/A;    Family History: Family History  Problem Relation Age of Onset   Heart attack Father        died in his 9's   Hypertension Sister    Cancer Brother        uncertain, leg   Hypertension Sister     Social History: Social History   Socioeconomic History   Marital status: Widowed    Spouse name: Not on file   Number of children: Not on file   Years of education: Not on file   Highest education level: Not on file  Occupational History   Occupation: semi-retired proofreader  Tobacco Use   Smoking status: Never   Smokeless tobacco: Never  Vaping Use   Vaping status: Never Used  Substance and Sexual Activity   Alcohol use: No   Drug use: No   Sexual activity: Not on file  Other Topics Concern   Not on file  Social History Narrative   Lives with daughters.    Social Drivers of Corporate Investment Banker Strain: Not on file  Food Insecurity: Patient Declined (08/22/2024)   Hunger Vital Sign    Worried About Running Out of Food in the Last Year: Patient declined    Ran Out of Food in the Last Year: Patient declined  Transportation Needs: Patient Declined (08/22/2024)   PRAPARE - Administrator, Civil Service (Medical): Patient declined    Lack of Transportation (Non-Medical): Patient declined  Physical Activity: Not on file  Stress: Not on file   Social Connections: Unknown (08/22/2024)   Social Connection and Isolation Panel    Frequency of Communication with Friends and Family: Patient declined    Frequency of Social Gatherings with Friends and Family: Patient declined    Attends Religious Services: Patient declined    Database Administrator or Organizations: Not on file    Attends Banker Meetings: Patient declined    Marital Status: Patient declined    Allergies:  Allergies  Allergen Reactions   Contrast Media [Iodinated Contrast Media] Shortness Of Breath and Nausea And Vomiting  Lisinopril  Cough    Objective:    Vital Signs:   Temp:  [97.6 F (36.4 C)-98.1 F (36.7 C)] 98.1 F (36.7 C) (12/09 0750) Pulse Rate:  [68-73] 68 (12/09 0802) Resp:  [15-22] 19 (12/09 0802) BP: (118-136)/(45-94) 129/53 (12/09 0750) SpO2:  [90 %-99 %] 99 % (12/09 0802) Weight:  [92.5 kg] 92.5 kg (12/09 0500) Last BM Date : 08/29/24 (Per patient)  Weight change: Filed Weights   08/31/24 0500 09/01/24 0500 09/02/24 0500  Weight: 90 kg 90.8 kg 92.5 kg    Intake/Output:  Intake/Output Summary (Last 24 hours) at 09/02/2024 1014 Last data filed at 09/02/2024 9361 Gross per 24 hour  Intake 542.31 ml  Output 450 ml  Net 92.31 ml    Physical Exam    General: Elderly appearing. No distress  Cardiac: JVP ~12cm. No murmurs  GI: distended Extremities: Warm and dry.  Mild peripheral edema.  Neuro: A&O x3. Affect pleasant.   Telemetry   SR 70s (personally reviewed)  Labs   Basic Metabolic Panel: Recent Labs  Lab 08/28/24 0617 08/29/24 0612 08/29/24 1741 08/30/24 0555 08/31/24 0830 09/01/24 0558 09/02/24 0500  NA 130* 132* 131* 135 135 134* 132*  K 5.5* 4.6 4.7 3.7 3.9 3.9 3.7  CL 100 96*  --  98 97* 97* 97*  CO2 21* 25  --  25 26 25  20*  GLUCOSE 94 106*  --  125* 86 89 92  BUN 91* 65*  --  41* 29* 33* 38*  CREATININE 5.07* 3.52*  --  2.54* 2.38* 2.50* 2.48*  CALCIUM  9.5 9.6  --  9.1 9.3 9.5 9.0  MG 1.9  1.9  --  1.9  --  1.8 1.8  PHOS  --   --   --  2.5 2.7 3.2 3.1    Liver Function Tests: Recent Labs  Lab 08/31/24 0830 09/01/24 0558 09/02/24 0500  ALBUMIN  3.4* 3.2* 3.0*   No results for input(s): LIPASE, AMYLASE in the last 168 hours. No results for input(s): AMMONIA in the last 168 hours.  CBC: Recent Labs  Lab 08/27/24 0431 08/28/24 0935 08/29/24 1741 09/01/24 0558 09/02/24 0500  WBC 8.8 5.9  --  6.2 7.2  HGB 7.4* 7.1* 7.5* 7.2* 7.1*  HCT 23.5* 21.7* 22.0* 22.4* 22.6*  MCV 92.2 92.7  --  92.9 94.6  PLT 184 176  --  126* 147*   BNP (last 3 results) Recent Labs    06/25/24 1604 07/10/24 0227 08/22/24 0903  BNP 1,585.3* 1,952.1* 525.0*   Medications:    Current Medications:  acetaminophen   650 mg Oral Q6H   amiodarone   200 mg Oral Daily   atorvastatin   40 mg Oral Daily   Chlorhexidine  Gluconate Cloth  6 each Topical Daily   Chlorhexidine  Gluconate Cloth  6 each Topical Q0600   Chlorhexidine  Gluconate Cloth  6 each Topical Q0600   collagenase    Topical Daily   darbepoetin (ARANESP ) injection - DIALYSIS  60 mcg Subcutaneous Q Tue-1800   heparin  injection (subcutaneous)  5,000 Units Subcutaneous Q8H   heparin  sodium (porcine)  10,000 Units Intracatheter Once   hydrocerin   Topical BID   lidocaine -EPINEPHrine   20 mL Intradermal Once   lidocaine -EPINEPHrine   20 mL Intradermal Once   polyethylene glycol  17 g Oral Daily   sodium chloride  flush  10-40 mL Intracatheter Q12H    Infusions:  iron  sucrose 200 mg (09/01/24 1403)   milrinone  0.25 mcg/kg/min (09/02/24 0743)   Assessment/Plan   Acute on  chronic systolic heart failure: Primarily ischemic, prior history of infarcted RCA.  Multiple recent admission requiring milrinone . Now with progressive cardio-renal syndrome requiring HD.  - Most recent TTE 8/25 EF 35-40%, RV mildly reduced. TEE 9/25 with moderate MR - initially started on milrinone  for co-ox 59, stopped d/t hypotension and renal function -  restarted on milrinone  12/8 for again co-ox 48% - switch milrinone  to dobutamine  2.5 - some UOP with lasix  challenge overnight, will get iHD tonight; nephrology feels that with inotrope he has a chance for renal recovery - remains volume up, give IV Lasix  160 mg bid today + HD tonight - GDMT limited by CKD    Mitral regurgitation: 2/2 ischemic disease. TEE 9/25 with more moderate MR by PISA, valve area, systolic blunting of pulmonary veins. 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 Now with progressive cardio-renal disease. Nephrology feels that he has a change for renal recovery with inotrope support. 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 Metastatic dz, though liver cirrhosis may be 2/2 HF - differ further w/u to IM/PCP   Anemia of Chronic Disease - hgb 8.8>7.1 - s/p IV iron  - mgmt per primary   Length of Stay: 32  Jordan Lee, NP  09/02/2024, 10:14 AM  Advanced Heart Failure Team Pager [phone] (M-F; 7a - 5p)  Please contact [facility] for night-coverage after hours (4p -7a ) and weekends on [email or web address]  Patient seen with NP, I formulated the plan and agree with the above note.   He has a history of CAD with ischemic cardiomyopathy and Medtronic ICD with prior VT/VF.  Also with CKD stage IV at baseline.  Last echo in 8/25 showed EF 35-40%, mild RV dysfunction, mild RV enlargement, moderate-severe MR, moderate TR.  TEE in 9/25 showed moderate infarct-related MR and severe lead-related TR.    Patient was admitted with fall and  T6 fractures as well as low output HF and volume overload.  He is in a wheelchair at baseline.  He developed progressive cardiorenal syndrome after admission and despite starting on milrinone , progressed to HD.  He had initial HD on 12/5. Lasix  and milrinone  were stopped after HD begun, but co-ox dropped to 48% and milrinone  was restarted at 0.25.  Co-ox >80% today.   Patient received IV Lasix  yesterday with about 450 cc UOP.    He also has a history of PAF and has not been anticoagulated due to GI bleeding.  Last hgb was 7.1.  He is on po amiodarone  and in NSR here.   General: NAD Neck: JVP 14 cm, no thyromegaly or thyroid nodule.  Lungs: Clear to auscultation bilaterally with normal respiratory effort. CV: Nondisplaced PMI.  Heart regular S1/S2, no S3/S4, 2/6 HSM LLSB.  2+ edema to thighs.  No carotid bruit.  Difficult to palpate pedal pulses.  Abdomen: Soft, nontender, no hepatosplenomegaly, no distention.  Skin: Intact without lesions or rashes.  Neurologic: Alert and oriented x  3.  Psych: Normal affect. Extremities: No clubbing or cyanosis.  HEENT: Normal.   Ischemic cardiomyopathy, has had prior hospitalizations with low output HF requiring milrinone  support.  This admission, was volume overloaded and diuresed, developed AKI progressing to need for HD despite milrinone  use.  Had HD on 12/5 initially.  Not making much urine with bolus of 80 mg IV Lasix  yesterday. I discussed the patient with Dr. Gearline from nephrology at bedside.  She thinks that there is a chance renal function may recover with inotropic support.  He is significantly volume overloaded today.  - I will write for Lasix  160 mg IV bid.  Will plan for another HD session this evening if minimal response.  - With advanced renal failure/?ESRD, will stop milrinone  and start dobutamine  2.5 mcg/kg/min.  He could potentially go home on palliative dobutamine  (and potentially get outpatient HD per Dr. Gearline if we are unable to get him off HD  this admission).  - Overall poor prognosis, baseline in wheelchair, not candidate for advanced therapies.   Hgb 7.1, management per primary team/nephrology.   Ezra Shuck 09/02/2024 11:27 AM

## 2024-09-02 NOTE — Progress Notes (Signed)
 [facility] Progress Note   Assessment/ Plan:   # AKI on CKD IV (baseline 2.3-2.6)--> cardiorenal syndrome, decompensated CHF - TTE 04/2024 with EF 35-40% - on milrinone  here - cardiorenal seems to be more at play here - Cr has plateaued but volume status is the limiting step here--didn't put out expected UOP with Lasix  challenge - discussed with AHF- not uremic so if cardiac function can be optimized we may possibly get him off dialysis, especially if his Cr is steady - HD today--> then will reassess needs with med modification  #Acute on chronic CHF - on milrionone - switching to dobutamine  - Lasix  - HD today--> hopefully can liberate if we can get UOP going   # Acute hypoxic respiratory failure  - on supplemental oxygen  - improving   # Hyperkalemia - resolved   # Anemia of CKD - getting 1000 mg IV iron  - increase aranesp  to 150 mcg q Tuesday   Subjective:    Seen in room.  AHF consulted, appreciate assistance.  Will be converting milrinone  to dobutamine    Objective:   BP (!) 116/38 (BP Location: Left Arm)   Pulse 78   Temp 97.8 F (36.6 C) (Oral)   Resp 18   Ht 5' 6 (1.676 m)   Wt 92.5 kg   SpO2 97%   BMI 32.91 kg/m   Intake/Output Summary (Last 24 hours) at 09/02/2024 1202 Last data filed at 09/02/2024 9361 Gross per 24 hour  Intake 542.31 ml  Output 450 ml  Net 92.31 ml   Weight change: 1.7 kg  Physical Exam: Gen:NAD CVS: RRR Resp: muffled bilateral bases Abd: distended Ext:in UNNA boots ACCESS: R internal jugular TDC  Imaging: No results found.  Labs: BMET Recent Labs  Lab 08/27/24 0431 08/28/24 0617 08/29/24 0612 08/29/24 1741 08/30/24 0555 08/31/24 0830 09/01/24 0558 09/02/24 0500  NA 131* 130* 132* 131* 135 135 134* 132*  K 5.7* 5.5* 4.6 4.7 3.7 3.9 3.9 3.7  CL 102 100 96*  --  98 97* 97* 97*  CO2 21* 21* 25  --  25 26 25  20*  GLUCOSE 104* 94 106*  --  125* 86 89 92  BUN 84* 91* 65*  --  41* 29* 33* 38*   CREATININE 4.89* 5.07* 3.52*  --  2.54* 2.38* 2.50* 2.48*  CALCIUM  9.7 9.5 9.6  --  9.1 9.3 9.5 9.0  PHOS  --   --   --   --  2.5 2.7 3.2 3.1   CBC Recent Labs  Lab 08/27/24 0431 08/28/24 0935 08/29/24 1741 09/01/24 0558 09/02/24 0500  WBC 8.8 5.9  --  6.2 7.2  HGB 7.4* 7.1* 7.5* 7.2* 7.1*  HCT 23.5* 21.7* 22.0* 22.4* 22.6*  MCV 92.2 92.7  --  92.9 94.6  PLT 184 176  --  126* 147*    Medications:     acetaminophen   650 mg Oral Q6H   amiodarone   200 mg Oral Daily   atorvastatin   40 mg Oral Daily   Chlorhexidine  Gluconate Cloth  6 each Topical Daily   Chlorhexidine  Gluconate Cloth  6 each Topical Q0600   Chlorhexidine  Gluconate Cloth  6 each Topical Q0600   collagenase    Topical Daily   darbepoetin (ARANESP ) injection - DIALYSIS  60 mcg Subcutaneous Q Tue-1800   heparin  injection (subcutaneous)  5,000 Units Subcutaneous Q8H   heparin  sodium (porcine)  10,000 Units Intracatheter Once   hydrocerin   Topical BID   lidocaine -EPINEPHrine   20  mL Intradermal Once   lidocaine -EPINEPHrine   20 mL Intradermal Once   polyethylene glycol  17 g Oral Daily   sodium chloride  flush  10-40 mL Intracatheter Q12H    Almarie Bonine, MD 09/02/2024, 12:02 PM

## 2024-09-03 LAB — RENAL FUNCTION PANEL
Albumin: 3.1 g/dL — ABNORMAL LOW (ref 3.5–5.0)
Anion gap: 11 (ref 5–15)
BUN: 21 mg/dL (ref 8–23)
CO2: 28 mmol/L (ref 22–32)
Calcium: 8.7 mg/dL — ABNORMAL LOW (ref 8.9–10.3)
Chloride: 94 mmol/L — ABNORMAL LOW (ref 98–111)
Creatinine, Ser: 1.48 mg/dL — ABNORMAL HIGH (ref 0.61–1.24)
GFR, Estimated: 49 mL/min — ABNORMAL LOW (ref >60)
Glucose, Bld: 95 mg/dL (ref 70–99)
Phosphorus: 2 mg/dL — ABNORMAL LOW (ref 2.5–4.6)
Potassium: 3.3 mmol/L — ABNORMAL LOW (ref 3.5–5.1)
Sodium: 133 mmol/L — ABNORMAL LOW (ref 135–145)

## 2024-09-03 LAB — BASIC METABOLIC PANEL WITH GFR
Anion gap: 6 (ref 5–15)
BUN: 19 mg/dL (ref 8–23)
CO2: 30 mmol/L (ref 22–32)
Calcium: 9.1 mg/dL (ref 8.9–10.3)
Chloride: 96 mmol/L — ABNORMAL LOW (ref 98–111)
Creatinine, Ser: 1.53 mg/dL — ABNORMAL HIGH (ref 0.61–1.24)
GFR, Estimated: 47 mL/min — ABNORMAL LOW (ref >60)
Glucose, Bld: 98 mg/dL (ref 70–99)
Potassium: 3.3 mmol/L — ABNORMAL LOW (ref 3.5–5.1)
Sodium: 132 mmol/L — ABNORMAL LOW (ref 135–145)

## 2024-09-03 LAB — CBC
HCT: 23.9 % — ABNORMAL LOW (ref 39.0–52.0)
Hemoglobin: 7.5 g/dL — ABNORMAL LOW (ref 13.0–17.0)
MCH: 29.8 pg (ref 26.0–34.0)
MCHC: 31.4 g/dL (ref 30.0–36.0)
MCV: 94.8 fL (ref 80.0–100.0)
Platelets: 145 K/uL — ABNORMAL LOW (ref 150–400)
RBC: 2.52 MIL/uL — ABNORMAL LOW (ref 4.22–5.81)
RDW: 16.8 % — ABNORMAL HIGH (ref 11.5–15.5)
WBC: 7.5 K/uL (ref 4.0–10.5)
nRBC: 0 % (ref 0.0–0.2)

## 2024-09-03 LAB — MAGNESIUM: Magnesium: 1.7 mg/dL (ref 1.7–2.4)

## 2024-09-03 MED ORDER — POTASSIUM CHLORIDE CRYS ER 20 MEQ PO TBCR
20.0000 meq | EXTENDED_RELEASE_TABLET | Freq: Once | ORAL | Status: AC
  Administered 2024-09-03: 20 meq via ORAL
  Filled 2024-09-03: qty 1

## 2024-09-03 MED ORDER — SODIUM CHLORIDE 0.9 % IV SOLN
200.0000 mg | INTRAVENOUS | Status: AC
  Administered 2024-09-03 – 2024-09-05 (×2): 200 mg via INTRAVENOUS
  Filled 2024-09-03 (×2): qty 10

## 2024-09-03 MED ORDER — FUROSEMIDE 10 MG/ML IJ SOLN
200.0000 mg | Freq: Two times a day (BID) | INTRAVENOUS | Status: DC
  Administered 2024-09-03 – 2024-09-04 (×3): 200 mg via INTRAVENOUS
  Filled 2024-09-03 (×4): qty 20

## 2024-09-03 NOTE — Plan of Care (Signed)
°  Problem: Education: Goal: Knowledge of General Education information will improve Description: Including pain rating scale, medication(s)/side effects and non-pharmacologic comfort measures Outcome: Progressing   Problem: Health Behavior/Discharge Planning: Goal: Ability to manage health-related needs will improve Outcome: Progressing   Problem: Clinical Measurements: Goal: Ability to maintain clinical measurements within normal limits will improve Outcome: Progressing Goal: Will remain free from infection Outcome: Progressing Goal: Diagnostic test results will improve Outcome: Progressing Goal: Respiratory complications will improve Outcome: Progressing Goal: Cardiovascular complication will be avoided Outcome: Progressing   Problem: Activity: Goal: Risk for activity intolerance will decrease Outcome: Progressing   Problem: Nutrition: Goal: Adequate nutrition will be maintained Outcome: Progressing   Problem: Coping: Goal: Level of anxiety will decrease Outcome: Progressing   Problem: Elimination: Goal: Will not experience complications related to bowel motility Outcome: Progressing Goal: Will not experience complications related to urinary retention Outcome: Progressing   Problem: Pain Managment: Goal: General experience of comfort will improve and/or be controlled Outcome: Progressing   Problem: Safety: Goal: Ability to remain free from injury will improve Outcome: Progressing   Problem: Skin Integrity: Goal: Risk for impaired skin integrity will decrease Outcome: Progressing   Problem: Education: Goal: Knowledge of disease and its progression will improve Outcome: Progressing   Problem: Fluid Volume: Goal: Compliance with measures to maintain balanced fluid volume will improve Outcome: Progressing   Problem: Health Behavior/Discharge Planning: Goal: Ability to manage health-related needs will improve Outcome: Progressing   Problem: Clinical  Measurements: Goal: Complications related to the disease process, condition or treatment will be avoided or minimized Outcome: Progressing   Problem: Education: Goal: Knowledge of disease and its progression will improve Outcome: Progressing   Problem: Health Behavior/Discharge Planning: Goal: Ability to manage health-related needs will improve Outcome: Progressing   Problem: Clinical Measurements: Goal: Complications related to the disease process or treatment will be avoided or minimized Outcome: Progressing Goal: Dialysis access will remain free of complications Outcome: Progressing   Problem: Activity: Goal: Activity intolerance will improve Outcome: Progressing   Problem: Fluid Volume: Goal: Fluid volume balance will be maintained or improved Outcome: Progressing   Problem: Nutritional: Goal: Ability to make appropriate dietary choices will improve Outcome: Progressing   Problem: Respiratory: Goal: Respiratory symptoms related to disease process will be avoided Outcome: Progressing   Problem: Self-Concept: Goal: Body image disturbance will be avoided or minimized Outcome: Progressing   Problem: Urinary Elimination: Goal: Progression of disease will be identified and treated Outcome: Progressing

## 2024-09-03 NOTE — Progress Notes (Signed)
 Orthopedic Tech Progress Note Patient Details:  Dean Schwarz Sr. 1951/04/25 [id number]  Ortho Devices Type of Ortho Device: Nonie boot Ortho Device/Splint Location: Bilateral Ortho Device/Splint Interventions: Ordered, Application, Adjustment   Post Interventions Patient Tolerated: Fair Instructions Provided: Adjustment of device, Care of device  [name] [name] 09/03/2024, 5:39 PM

## 2024-09-03 NOTE — Progress Notes (Signed)
 [facility] Progress Note   Assessment/ Plan:   # AKI on CKD IV (baseline 2.3-2.6)--> cardiorenal syndrome, decompensated CHF - TTE 04/2024 with EF 35-40% - cardiorenal seems to be more at play here - discussed with AHF- not uremic so if cardiac function can be optimized we may possibly get him off dialysis, especially if his Cr is steady - HD 12/9-> then will reassess needs with med modification - dobutamine  2.5--> strict I/O - reassess daily- I will not order dialysis for tomorrow so allow for optimization of medical therapy  #Acute on chronic CHF - milrinone  originally - switched to dobutamine  - Lasix  - HD 12/9   # Acute hypoxic respiratory failure  - on supplemental oxygen  - improving   # Hyperkalemia - resolved   # Anemia of CKD - getting 1000 mg IV iron  - increase aranesp  to 150 mcg q Tuesday   Subjective:    Seen in room. Tolerated HD yesterday, did well, 2.2L off.  400 mL UOP but seems like there were a couple of unmeasured voids.  Cr down to 1.5 (in the setting of post-dialysis but this lowest it's bene in a while so hopefully we're seeing some improvement?).   Objective:   BP (!) 132/44   Pulse 64   Temp 98.5 F (36.9 C) (Oral)   Resp 12   Ht 5' 6 (1.676 m)   Wt 91.3 kg   SpO2 95%   BMI 32.49 kg/m   Intake/Output Summary (Last 24 hours) at 09/03/2024 1159 Last data filed at 09/03/2024 0602 Gross per 24 hour  Intake 533.34 ml  Output 2600 ml  Net -2066.66 ml   Weight change: -1.2 kg  Physical Exam: Gen:NAD CVS: RRR Resp: muffled bilateral bases Abd: distended Ext:in UNNA boots removed ACCESS: R internal jugular TDC  Imaging: No results found.  Labs: BMET Recent Labs  Lab 08/28/24 0617 08/29/24 0612 08/29/24 1741 08/30/24 0555 08/31/24 0830 09/01/24 0558 09/02/24 0500 09/03/24 0529  NA 130* 132* 131* 135 135 134* 132* 132*  133*  K 5.5* 4.6 4.7 3.7 3.9 3.9 3.7 3.3*  3.3*  CL 100 96*  --  98 97* 97* 97* 96*  94*   CO2 21* 25  --  25 26 25  20* 30  28  GLUCOSE 94 106*  --  125* 86 89 92 98  95  BUN 91* 65*  --  41* 29* 33* 38* 19  21  CREATININE 5.07* 3.52*  --  2.54* 2.38* 2.50* 2.48* 1.53*  1.48*  CALCIUM  9.5 9.6  --  9.1 9.3 9.5 9.0 9.1  8.7*  PHOS  --   --   --  2.5 2.7 3.2 3.1 2.0*   CBC Recent Labs  Lab 08/28/24 0935 08/29/24 1741 09/01/24 0558 09/02/24 0500 09/03/24 0529  WBC 5.9  --  6.2 7.2 7.5  HGB 7.1* 7.5* 7.2* 7.1* 7.5*  HCT 21.7* 22.0* 22.4* 22.6* 23.9*  MCV 92.7  --  92.9 94.6 94.8  PLT 176  --  126* 147* 145*    Medications:     acetaminophen   650 mg Oral Q6H   amiodarone   200 mg Oral Daily   atorvastatin   40 mg Oral Daily   Chlorhexidine  Gluconate Cloth  6 each Topical Daily   Chlorhexidine  Gluconate Cloth  6 each Topical Q0600   Chlorhexidine  Gluconate Cloth  6 each Topical Q0600   collagenase    Topical Daily   darbepoetin (ARANESP ) injection - DIALYSIS  150 mcg Subcutaneous  Q Tue-1800   heparin  injection (subcutaneous)  5,000 Units Subcutaneous Q8H   heparin  sodium (porcine)  10,000 Units Intracatheter Once   hydrocerin   Topical BID   lidocaine -EPINEPHrine   20 mL Intradermal Once   lidocaine -EPINEPHrine   20 mL Intradermal Once   polyethylene glycol  17 g Oral Daily   sodium chloride  flush  10-40 mL Intracatheter Q12H    Almarie Bonine, MD 09/03/2024, 11:59 AM

## 2024-09-03 NOTE — Progress Notes (Signed)
 PROGRESS NOTE  Dean Croft Sr. FMW:[id number] DOB: 1951/01/06 DOA: 08/22/2024 PCP: [name], MD   LOS: 12 days   Brief narrative:  Patient is 73 years old male with past medical history of  CAD, PAF, ICM, cardiac arrest s/p AICD, HFrEF (EF 35-40% in 04/2024), hypertension, hyperlipidemia, type 2 diabetes, obstructive sleep apnea and recent admission on 07/16/24 for HFrEF exacerbation presented to hospital with back pain after falling from the wheelchair.  He was noted to have acute kidney injury and T6 fracture with lower extremity edema.  Patient's hospital stay was complicated with heart failure with worsening renal function and cardiology and nephrology were consulted.  Patient had hemodialysis initiated for volume management and underwent thoracocentesis on 11-28 and 12-5 for worsening right pleural effusion.  Also required IV iron .    At this time, patient has been followed by cardiology and is on dobutamine , lasix  drip and undergoing hemodialysis for volume management.      Assessment/Plan: Principal Problem:   Multiple traumatic injuries Active Problems:   Cardiorenal syndrome with renal failure   Cardiorenal syndrome  Cardiorenal syndrome with renal failure / Low output biventricular heart failure -Cardiology following.  Last 2D echocardiogram with EF of 35 to 40%.  Cardiac catheterization 06/19/24 showed moderate pulmonary hypertension and low cardiac output.  Patient was initially on milrinone  drip with cardiology which was subsequently discontinued but at this time has been started on dobutamine .  Continue strict intake and output charting.  Patient had 400 mL urinary output in the last 24 hours.    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 Status post hemodialysis catheter placement and first hemodialysis on 08/28/2024. Follow nephrology recommendation.  Continue intake and output, check daily weights.  Patient follows up with Dr. [name] as outpatient.  Latest creatinine of 1.5 and trending  down.  Has received IV Lasix  bolus and from today started on Lasix  drip.   Acute on chronic anemia -likely from CKD and iron  deficiency.  Received IV iron . Latest hemoglobin of 7.5 from 7.1.  Status post Aranesp    Acute T6 fracture, physical deconditioning: Neurosurgery was consulted.  Recommended TSLO brace and outpatient follow-up with neurosurgery Dr. [name].  Continue PT, OT while in the hospital.   R pleural effusion: RML/RLL consolidation Patient underwent IR guided thoracentesis on 11/28 with removal of 500 mL of bloody fluid.  Patient also had repeat thoracentesis on 12/5, with removal of 1.5L of bloody fluid.  Currently room air and remained stable.   Hyperkalemia -resolved and now with mild hypokalemia.  Will replenish orally.  Check BMP in AM.   Liver enhancement on imaging: Could be secondary to amiodarone .   PAF: Patient on amiodarone  200mg  daily.  Patient was on Eliquis  5mg  BID (currently held) due to anemia.  DVT prophylaxis: heparin  injection 5,000 Units Start: 08/27/24 1615   Disposition: Likely home with home health likely in few days.  Status is: Inpatient  Remains inpatient appropriate because: Need for hemodialysis, pending clinical improvement, on dobutamine , Lasix  drip.    Code Status:     Code Status: Limited: Do not attempt resuscitation (DNR) -DNR-LIMITED -Do Not Intubate/DNI   Family Communication: Spoke with the patient's daughter 09/03/2024 and updated her about the clinical condition of the patient.  Consultants: Nephrology Cardiology Trauma surgery  Procedures: Thoracocentesis Hemodialysis catheter placement Right upper extremity PICC line in place Hemodialysis  Anti-infectives:  None  Anti-infectives (From admission, onward)    Start     Dose/Rate Route Frequency Ordered Stop   08/28/24 1345  ceFAZolin  (  ANCEF ) IVPB 2g/100 mL premix        2 g 200 mL/hr over 30 Minutes Intravenous To Radiology 08/28/24 1247 08/28/24 1545        Subjective: Today, patient was seen and examined at bedside.  Patient denies pain, nausea, vomiting, shortness of breath or chest pain.  Has some shortness of breath.  Was able to breathe better.  Has had some urinary output.    Objective: Vitals:   09/03/24 0619 09/03/24 0739  BP: (!) 142/43 (!) 135/42  Pulse: 63 62  Resp: 19 17  Temp: 98.3 F (36.8 C) 97.8 F (36.6 C)  SpO2: 92% 94%    Intake/Output Summary (Last 24 hours) at 09/03/2024 1058 Last data filed at 09/03/2024 0602 Gross per 24 hour  Intake 533.34 ml  Output 2600 ml  Net -2066.66 ml   Filed Weights   09/01/24 0500 09/02/24 0500 09/03/24 0500  Weight: 90.8 kg 92.5 kg 91.3 kg   Body mass index is 32.49 kg/m.   Physical Exam: General: Obese built, not in obvious distress, on nasal cannula HENT:   No scleral pallor or icterus noted. Oral mucosa is moist.  Chest:   Diminished breath sounds bilaterally.  Right chest wall hemodialysis catheter CVS: S1 &S2 heard. No murmur.  Regular rate and rhythm. Abdomen: Soft, nontender, nondistended.  Bowel sounds are heard.   Extremities: No cyanosis, clubbing with bilateral lower extremity pitting edema.  Right upper extremity PICC line in place.  Right leg with dressing. Psych: Alert, awake and oriented, normal mood CNS:  No cranial nerve deficits.  Moves all extremities Skin: Warm and dry.  Right leg with dressing.   Data Review: I have personally reviewed the following laboratory data and studies,  CBC: Recent Labs  Lab 08/28/24 0935 08/29/24 1741 09/01/24 0558 09/02/24 0500 09/03/24 0529  WBC 5.9  --  6.2 7.2 7.5  HGB 7.1* 7.5* 7.2* 7.1* 7.5*  HCT 21.7* 22.0* 22.4* 22.6* 23.9*  MCV 92.7  --  92.9 94.6 94.8  PLT 176  --  126* 147* 145*   Basic Metabolic Panel: Recent Labs  Lab 08/29/24 0612 08/29/24 1741 08/30/24 0555 08/31/24 0830 09/01/24 0558 09/02/24 0500 09/03/24 0529  NA 132*   < > 135 135 134* 132* 132*  133*  K 4.6   < > 3.7 3.9 3.9 3.7  3.3*  3.3*  CL 96*  --  98 97* 97* 97* 96*  94*  CO2 25  --  25 26 25  20* 30  28  GLUCOSE 106*  --  125* 86 89 92 98  95  BUN 65*  --  41* 29* 33* 38* 19  21  CREATININE 3.52*  --  2.54* 2.38* 2.50* 2.48* 1.53*  1.48*  CALCIUM  9.6  --  9.1 9.3 9.5 9.0 9.1  8.7*  MG 1.9  --  1.9  --  1.8 1.8 1.7  PHOS  --   --  2.5 2.7 3.2 3.1 2.0*   < > = values in this interval not displayed.   Liver Function Tests: Recent Labs  Lab 08/31/24 0830 09/01/24 0558 09/02/24 0500 09/03/24 0529  ALBUMIN  3.4* 3.2* 3.0* 3.1*   No results for input(s): LIPASE, AMYLASE in the last 168 hours. No results for input(s): AMMONIA in the last 168 hours. Cardiac Enzymes: No results for input(s): CKTOTAL, CKMB, CKMBINDEX, TROPONINI in the last 168 hours. BNP (last 3 results) Recent Labs    06/25/24 1604 07/10/24 0227 08/22/24 0903  BNP  1,585.3* 1,952.1* 525.0*    ProBNP (last 3 results) No results for input(s): PROBNP in the last 8760 hours.  CBG: No results for input(s): GLUCAP in the last 168 hours. No results found for this or any previous visit (from the past 240 hours).    Studies: No results found.     Dewell Monnier, MD  [facility] 09/03/2024  If 7PM-7AM, please contact night-coverage

## 2024-09-03 NOTE — Progress Notes (Addendum)
 Advanced Heart Failure Rounding Note  Cardiologist: Vinie JAYSON Maxcy, MD  Chief Complaint: Acute renal failure Subjective:    CRRT overnight with 2.6 L removed. 400cc UOP with Lasix  160 bid Working with PT this morning, improved dyspnea.   Objective:    Weight Range: 91.3 kg Body mass index is 32.49 kg/m.   Vital Signs:   Temp:  [97.8 F (36.6 C)-98.7 F (37.1 C)] 97.8 F (36.6 C) (12/10 0739) Pulse Rate:  [62-80] 62 (12/10 0739) Resp:  [17-25] 17 (12/10 0739) BP: (116-142)/(32-101) 135/42 (12/10 0739) SpO2:  [92 %-100 %] 94 % (12/10 0739) Weight:  [91.3 kg] 91.3 kg (12/10 0500) Last BM Date : 09/02/24  Weight change: Filed Weights   09/01/24 0500 09/02/24 0500 09/03/24 0500  Weight: 90.8 kg 92.5 kg 91.3 kg   Intake/Output:  Intake/Output Summary (Last 24 hours) at 09/03/2024 0958 Last data filed at 09/03/2024 0602 Gross per 24 hour  Intake 533.34 ml  Output 2600 ml  Net -2066.66 ml    Physical Exam    General: Elderly appearing. No distress  Cardiac: JVP ~10cm. No murmurs  Extremities: Warm and dry.  3+ BLE edema.  Neuro: A&O x3. Affect pleasant.   Telemetry   SR 50-60s (personally reviewed)  Labs    CBC Recent Labs    09/02/24 0500 09/03/24 0529  WBC 7.2 7.5  HGB 7.1* 7.5*  HCT 22.6* 23.9*  MCV 94.6 94.8  PLT 147* 145*   Basic Metabolic Panel Recent Labs    87/90/74 0500 09/03/24 0529  NA 132* 132*  133*  K 3.7 3.3*  3.3*  CL 97* 96*  94*  CO2 20* 30  28  GLUCOSE 92 98  95  BUN 38* 19  21  CREATININE 2.48* 1.53*  1.48*  CALCIUM  9.0 9.1  8.7*  MG 1.8 1.7  PHOS 3.1 2.0*   Liver Function Tests Recent Labs    09/02/24 0500 09/03/24 0529  ALBUMIN  3.0* 3.1*   BNP (last 3 results) Recent Labs    06/25/24 1604 07/10/24 0227 08/22/24 0903  BNP 1,585.3* 1,952.1* 525.0*   Medications:    Scheduled Medications:  acetaminophen   650 mg Oral Q6H   amiodarone   200 mg Oral Daily   atorvastatin   40 mg Oral Daily    Chlorhexidine  Gluconate Cloth  6 each Topical Daily   Chlorhexidine  Gluconate Cloth  6 each Topical Q0600   Chlorhexidine  Gluconate Cloth  6 each Topical Q0600   collagenase    Topical Daily   darbepoetin (ARANESP ) injection - DIALYSIS  150 mcg Subcutaneous Q Tue-1800   heparin  injection (subcutaneous)  5,000 Units Subcutaneous Q8H   heparin  sodium (porcine)  10,000 Units Intracatheter Once   hydrocerin   Topical BID   lidocaine -EPINEPHrine   20 mL Intradermal Once   lidocaine -EPINEPHrine   20 mL Intradermal Once   polyethylene glycol  17 g Oral Daily   sodium chloride  flush  10-40 mL Intracatheter Q12H    Infusions:  DOBUTamine  2.5 mcg/kg/min (09/03/24 0602)   iron  sucrose 200 mg (09/03/24 0937)    PRN Medications: HYDROmorphone  (DILAUDID ) injection, methocarbamol , mouth rinse, oxyCODONE , sodium chloride  flush  Assessment/Plan   Acute on chronic systolic heart failure: Primarily ischemic, prior history of infarcted RCA.  Multiple recent admission requiring milrinone . Now with progressive cardio-renal syndrome requiring HD.  - Most recent TTE 8/25 EF 35-40%, RV mildly reduced. TEE 9/25 with moderate MR - initially started on milrinone  for co-ox 59, stopped d/t hypotension and renal function -  restarted on milrinone  12/8 for again co-ox 48% - switched milrinone  to dobutamine  12/9 - continue dobutamine  2.5  - oliguric; give IV Lasix  200 mg bid today; HD schedule per nephro - UNNA boots - nephrology feels that with inotrope he has a chance for renal recovery - GDMT limited by CKD    Mitral regurgitation: 2/2 ischemic disease. TEE 9/25 with more moderate MR by PISA, valve area, systolic blunting of pulmonary veins. 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 Now with progressive cardio-renal disease. Nephrology feels that he has a change for renal recovery with inotrope support. - Cr up to 5 with anuria - s/p HDC placement and iHD - oliguric, some urine with IV lasix  - plan as  above   PAF - Previously diagnosed on device - Continue amio 200 mg daily  - Holding eliquis  d/t anemia    Hx VT/VF - Has single chamber ICD - Continue amiodarone  200 mg daily - monitor with DBA    CAD: Single vessel RCA disease - No anginal pain   Abnormal Chest CT  - posterior right pulmonary nodule is new since prior study + s/o cirrhosis + hepatic lesions concerning for ? Metastatic dz, though liver cirrhosis may be 2/2 HF - differ further w/u to IM/PCP    Anemia of Chronic Disease - hgb 8.8>7.1>7.5 - s/p IV iron  - mgmt per primary  Length of Stay: 56  Dean Thedford, NP  09/03/2024, 9:58 AM  Advanced Heart Failure Team Pager [phone] (M-F; 7a - 5p)  Please contact [facility] for night-coverage after hours (5p -7a ) and weekends on [email or web address]

## 2024-09-03 NOTE — Progress Notes (Signed)
 Physical Therapy Treatment Patient Details Name: Dean Pop Sr. MRN: [id number] DOB: [birth year] Today's Date: 09/03/2024   History of Present Illness 73 yo male s/p fall down 8 stairs in wheelchair. CT (+) T6 vertebral body fx, neurosurgery states no plan for surgery but needs TLSO brace. S/p R thoracentesis 11/28 yielding 500 mL bloody fluid. HD initiated 12/4. PMH AICD, arthritis, HLD, HFrEF, ICD, PAF on Eliquis , HTN, CAD, HLD, OSA on cpap, T2DM, CKD, HTN, laminectomy, cardiac arrest (2017).    PT Comments  Pt with improved activity tolerance and ease of mobility this date compared to Monday. Pt with increased ambulation distance and didn't require seated or standing rest breaks this date. Pt continues to require assist for donning back brace properly, verbal cues to adhere to back precautions, and use of RW for safe mobility. Continue to recommend inpatient rehab program < 3 hrs a day to allow for increased time to achieve safe mod I level of function for safe transition home. Acute PT to cont to follow.    If plan is discharge home, recommend the following: A little help with walking and/or transfers;A little help with bathing/dressing/bathroom;Assistance with cooking/housework;Help with stairs or ramp for entrance   Can travel by private vehicle        Equipment Recommendations  Rolling walker (2 wheels)    Recommendations for Other Services       Precautions / Restrictions Precautions Precautions: Fall;Back Recall of Precautions/Restrictions: Intact Required Braces or Orthoses: Spinal Brace Spinal Brace: Thoracolumbosacral orthotic Restrictions Weight Bearing Restrictions Per Provider Order: No     Mobility  Bed Mobility               General bed mobility comments: pt received sitting up in chair    Transfers Overall transfer level: Needs assistance Equipment used: Rolling walker (2 wheels) Transfers: Sit to/from Stand Sit to Stand: Supervision            General transfer comment: verbal cues to push up from arm rests    Ambulation/Gait Ambulation/Gait assistance: Contact guard assist Gait Distance (Feet): 150 Feet Assistive device: Rolling walker (2 wheels) Gait Pattern/deviations: Step-through pattern, Decreased stride length, Trunk flexed Gait velocity: decr     General Gait Details: pt much improved from endurance stand point compared to Monday, pt did not require seated or standing rest breaks today, pt with fluid gait pattern without LOB this date, verbal directional cues   Stairs             Wheelchair Mobility     Tilt Bed    Modified Rankin (Stroke Patients Only)       Balance Overall balance assessment: Needs assistance Sitting-balance support: No upper extremity supported, Feet supported Sitting balance-Leahy Scale: Fair     Standing balance support: Bilateral upper extremity supported Standing balance-Leahy Scale: Fair Standing balance comment: able to let go of RW and maintain balance while he adjusts brace before walking                            Communication Communication Communication: No apparent difficulties Factors Affecting Communication:  (slow processing)  Cognition Arousal: Alert Behavior During Therapy: WFL for tasks assessed/performed   PT - Cognitive impairments: Problem solving, Safety/Judgement                       PT - Cognition Comments: difficulty sequencing donning/doffing the brace. reports feeling depressed Following commands: Intact Following  commands impaired: Follows one step commands with increased time    Cueing Cueing Techniques: Verbal cues  Exercises General Exercises - Lower Extremity Long Arc Quad: AROM, Both, 10 reps, Seated (with end range hold) Heel Raises: AROM, Both, 10 reps, Seated Other Exercises Other Exercises: complete 2 sets of 10 reps of sit to stands    General Comments General comments (skin integrity, edema, etc.): VSS,  noted SOB wtih mobility however SpO2 > 95% on RA      Pertinent Vitals/Pain Pain Assessment Pain Assessment: Faces Faces Pain Scale: No hurt    Home Living                          Prior Function            PT Goals (current goals can now be found in the care plan section) Acute Rehab PT Goals Patient Stated Goal: return home PT Goal Formulation: With patient Time For Goal Achievement: 09/12/24 Potential to Achieve Goals: Good Progress towards PT goals: Progressing toward goals    Frequency    Min 2X/week      PT Plan      Co-evaluation              AM-PAC PT 6 Clicks Mobility   Outcome Measure  Help needed turning from your back to your side while in a flat bed without using bedrails?: A Little Help needed moving from lying on your back to sitting on the side of a flat bed without using bedrails?: A Lot Help needed moving to and from a bed to a chair (including a wheelchair)?: A Little Help needed standing up from a chair using your arms (e.g., wheelchair or bedside chair)?: A Little Help needed to walk in hospital room?: A Little Help needed climbing 3-5 steps with a railing? : A Lot 6 Click Score: 16    End of Session Equipment Utilized During Treatment: Back brace Activity Tolerance: Patient tolerated treatment well Patient left: in chair;with call bell/phone within reach;with bed alarm set Nurse Communication: Mobility status PT Visit Diagnosis: Other abnormalities of gait and mobility (R26.89);Muscle weakness (generalized) (M62.81)     Time: 9059-8991 PT Time Calculation (min) (ACUTE ONLY): 28 min  Charges:    $Gait Training: 8-22 mins $Therapeutic Exercise: 8-22 mins PT General Charges $$ ACUTE PT VISIT: 1 Visit                     Dean [name], PT, DPT Acute Rehabilitation Services Secure chat preferred Office #: [phone]    Dean CHRISTELLA [name] 09/03/2024, 10:17 AM

## 2024-09-03 NOTE — Progress Notes (Signed)
°   09/03/24 0619  Vitals  Temp 98.3 F (36.8 C)  Temp Source Oral  BP (!) 142/43  MAP (mmHg) 71  BP Location Left Arm  BP Method Automatic  Patient Position (if appropriate) Sitting  Pulse Rate 63  Pulse Rate Source Monitor  ECG Heart Rate 64  Resp 19  Level of Consciousness  Level of Consciousness Alert  MEWS COLOR  MEWS Score Color Green  Oxygen Therapy  SpO2 92 %  O2 Device Room Air  Pain Assessment  Pain Scale 0-10  Pain Score 0  MEWS Score  MEWS Temp 0  MEWS Systolic 0  MEWS Pulse 0  MEWS RR 0  MEWS LOC 0  MEWS Score 0   Patient remains on Dobutamine  gtt at 2.5 mcg/kg/min; MAP >65.

## 2024-09-03 NOTE — TOC Initial Note (Signed)
 Transition of Care (TOC) - Initial/Assessment Note    Patient Details  Name: Dean Stainback Sr. MRN: [id number] Date of Birth: 01/07/1951  Transition of Care [facility]) CM/SW Contact:    [name] Phone Number: [phone] 09/03/2024, 12:29 PM  Clinical Narrative:      HF CSW met with the patient at bedside. Patient stated that he lives with his two daughters and disabled son. Patient stated that he has a history of HH PT and OT. Patient stated that he uses a cane, walker, and wheelchair. Patient stated that he has a scale. Patient stated that he has a PCP. CSW explained that a hospital follow up appointment is typically scheduled closer towards dc. Patient is agreeable, and requested an afternoon appointment. Patient stated that one of his daughters will provide transportation at dc.   HF CSW/CM will continue to follow and monitor for dc readiness.              Expected Discharge Plan: Home/Self Care Barriers to Discharge: Continued Medical Work up   Patient Goals and CMS Choice Patient states their goals for this hospitalization and ongoing recovery are:: return home   Choice offered to / list presented to : NA      Expected Discharge Plan and Services   Discharge Planning Services: CM Consult   Living arrangements for the past 2 months: Single Family Home                                      Prior Living Arrangements/Services Living arrangements for the past 2 months: Single Family Home Lives with:: Relatives              Current home services: DME (cane, walker, wheelchair)    Activities of Daily Living   ADL Screening (condition at time of admission) Independently performs ADLs?: No Does the patient have a NEW difficulty with bathing/dressing/toileting/self-feeding that is expected to last >3 days?: Yes (Initiates electronic notice to provider for possible OT consult) Does the patient have a NEW difficulty with getting in/out of bed, walking,  or climbing stairs that is expected to last >3 days?: Yes (Initiates electronic notice to provider for possible PT consult) Does the patient have a NEW difficulty with communication that is expected to last >3 days?: Yes (Initiates electronic notice to provider for possible SLP consult) Is the patient deaf or have difficulty hearing?: No Does the patient have difficulty seeing, even when wearing glasses/contacts?: No Does the patient have difficulty concentrating, remembering, or making decisions?: No  Permission Sought/Granted                  Emotional Assessment              Admission diagnosis:  Pleural effusion on right [J90] Fall down stairs, initial encounter [W10.8XXA] Multiple traumatic injuries [T07.XXXA] Closed fracture of thoracic vertebral body (HCC) [S22.009A] Patient Active Problem List   Diagnosis Date Noted   Cardiorenal syndrome 08/27/2024   Cardiorenal syndrome with renal failure 08/25/2024   Multiple traumatic injuries 08/22/2024   Malnutrition of moderate degree 07/12/2024   HFrEF (heart failure with reduced ejection fraction) (HCC) 07/10/2024   Thrombocytopenia 07/10/2024   Anemia of chronic disease 07/10/2024   Abnormal CT of the chest 07/10/2024   Acute on chronic systolic CHF (congestive heart failure) (HCC) 05/04/2024   SOB (shortness of breath) 05/04/2024   Elevated troponin 05/04/2024  High anion gap metabolic acidosis 05/04/2024   Paroxysmal atrial fibrillation (HCC) 05/04/2024   Essential hypertension 05/04/2024   Obesity, class 1 05/04/2024   Acute on chronic heart failure (HCC) 04/20/2024   Acute GI bleeding 11/17/2023   Ventricular tachycardia (HCC) 08/24/2022   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/2020   Atrial fibrillation, chronic (HCC) 08/12/2020   Thoracic spine fracture (HCC) 06/29/2020   Hyperkalemia 10/23/2019   Diarrhea 10/23/2019   CHF (congestive heart failure) (HCC) 06/04/2019   Acute systolic CHF  (congestive heart failure) (HCC) 06/04/2019   Severe sepsis (HCC) 05/06/2019   Acute asthma exacerbation 05/06/2019   Acute respiratory distress 05/06/2019   Acute hypoxic respiratory failure (HCC) 04/06/2019   HCAP (healthcare-associated pneumonia) 04/06/2019   Acute kidney injury superimposed on chronic kidney disease 02/27/2019   Chronic combined systolic and diastolic CHF (congestive heart failure) (HCC) 02/27/2019   CKD (chronic kidney disease), stage III (HCC) 05/16/2018   Normochromic normocytic anemia 05/15/2018   CAD (coronary artery disease) 12/17/2017   HLD (hyperlipidemia) 12/17/2017   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   Cardiomyopathy, ischemic    Generalized weakness 10/04/2016   Stage 3 chronic kidney disease (HCC)    Chronic kidney disease, stage 3b (HCC)    Disorientated    Anoxic brain injury (HCC) 09/15/2016   Benign essential HTN    OSA (obstructive sleep apnea)    Transaminitis    Chronic respiratory failure with hypoxia (HCC)    Fever    Acute cystitis    NSVT (nonsustained ventricular tachycardia) (HCC)    Uncomplicated asthma    Type 2 diabetes mellitus in patient with obesity (HCC)    Acute on chronic combined systolic and diastolic CHF (congestive heart failure) (HCC)    Tachypnea    Hypokalemia    Leukocytosis    Hypoxemia    Acute respiratory failure (HCC)    Cardiopulmonary arrest (HCC) 08/27/2016   Type 2 diabetes mellitus with hyperlipidemia (HCC) 03/02/2014   Class 2 obesity due to excess calories with body mass index (BMI) of 35.0 to 35.9 in adult 03/02/2014   Essential hypertension, benign 03/02/2014   PCP:  [name], MD Pharmacy:   [id number], [place] - [address] [place] [address] Phone: [phone] Fax: [phone]     Social Drivers of Health (SDOH) Social History: SDOH Screenings   Food Insecurity: Patient Declined (08/22/2024)   Housing: Unknown (08/22/2024)  Transportation Needs: Patient Declined (08/22/2024)  Utilities: Patient Declined (08/22/2024)  Social Connections: Unknown (08/22/2024)  Tobacco Use: Low Risk  (08/22/2024)   SDOH Interventions:     Readmission Risk Interventions    05/09/2024    9:53 AM 04/22/2024   11:11 AM  Readmission Risk Prevention Plan  Transportation Screening Complete Complete  PCP or Specialist Appt within 5-7 Days  Complete  Home Care Screening  Complete  Medication Review (RN CM)  Complete  Medication Review (RN Care Manager) Complete   PCP or Specialist appointment within 3-5 days of discharge Complete   HRI or Home Care Consult Complete   SW Recovery Care/Counseling Consult Complete   Palliative Care Screening Not Applicable   Skilled Nursing Facility Not Applicable

## 2024-09-04 ENCOUNTER — Inpatient Hospital Stay (HOSPITAL_COMMUNITY)

## 2024-09-04 HISTORY — PX: IR REMOVAL TUN CV CATH W/O FL: IMG2289

## 2024-09-04 LAB — CBC
HCT: 24.7 % — ABNORMAL LOW (ref 39.0–52.0)
Hemoglobin: 7.8 g/dL — ABNORMAL LOW (ref 13.0–17.0)
MCH: 30 pg (ref 26.0–34.0)
MCHC: 31.6 g/dL (ref 30.0–36.0)
MCV: 95 fL (ref 80.0–100.0)
Platelets: 153 K/uL (ref 150–400)
RBC: 2.6 MIL/uL — ABNORMAL LOW (ref 4.22–5.81)
RDW: 17.4 % — ABNORMAL HIGH (ref 11.5–15.5)
WBC: 7.8 K/uL (ref 4.0–10.5)
nRBC: 0 % (ref 0.0–0.2)

## 2024-09-04 LAB — BASIC METABOLIC PANEL WITH GFR
Anion gap: 9 (ref 5–15)
BUN: 23 mg/dL (ref 8–23)
CO2: 27 mmol/L (ref 22–32)
Calcium: 9 mg/dL (ref 8.9–10.3)
Chloride: 96 mmol/L — ABNORMAL LOW (ref 98–111)
Creatinine, Ser: 1.68 mg/dL — ABNORMAL HIGH (ref 0.61–1.24)
GFR, Estimated: 42 mL/min — ABNORMAL LOW (ref >60)
Glucose, Bld: 102 mg/dL — ABNORMAL HIGH (ref 70–99)
Potassium: 3.6 mmol/L (ref 3.5–5.1)
Sodium: 132 mmol/L — ABNORMAL LOW (ref 135–145)

## 2024-09-04 LAB — RENAL FUNCTION PANEL
Albumin: 3.3 g/dL — ABNORMAL LOW (ref 3.5–5.0)
Anion gap: 8 (ref 5–15)
BUN: 23 mg/dL (ref 8–23)
CO2: 27 mmol/L (ref 22–32)
Calcium: 9 mg/dL (ref 8.9–10.3)
Chloride: 96 mmol/L — ABNORMAL LOW (ref 98–111)
Creatinine, Ser: 1.69 mg/dL — ABNORMAL HIGH (ref 0.61–1.24)
GFR, Estimated: 42 mL/min — ABNORMAL LOW (ref >60)
Glucose, Bld: 104 mg/dL — ABNORMAL HIGH (ref 70–99)
Phosphorus: 2.1 mg/dL — ABNORMAL LOW (ref 2.5–4.6)
Potassium: 3.5 mmol/L (ref 3.5–5.1)
Sodium: 131 mmol/L — ABNORMAL LOW (ref 135–145)

## 2024-09-04 LAB — COOXEMETRY PANEL
Carboxyhemoglobin: 1.6 % — ABNORMAL HIGH (ref 0.5–1.5)
Methemoglobin: <0.7 % (ref 0.0–1.5)
O2 Saturation: 56.7 %
Total hemoglobin: 7.4 g/dL — ABNORMAL LOW (ref 12.0–16.0)

## 2024-09-04 LAB — MAGNESIUM: Magnesium: 1.8 mg/dL (ref 1.7–2.4)

## 2024-09-04 MED ORDER — POTASSIUM CHLORIDE CRYS ER 20 MEQ PO TBCR
20.0000 meq | EXTENDED_RELEASE_TABLET | Freq: Once | ORAL | Status: AC
  Administered 2024-09-04: 20 meq via ORAL
  Filled 2024-09-04: qty 1

## 2024-09-04 MED ORDER — ALTEPLASE 2 MG IJ SOLR
2.0000 mg | Freq: Once | INTRAMUSCULAR | Status: AC
  Administered 2024-09-04: 2 mg
  Filled 2024-09-04: qty 2

## 2024-09-04 MED ORDER — FUROSEMIDE 10 MG/ML IJ SOLN
200.0000 mg | Freq: Two times a day (BID) | INTRAVENOUS | Status: AC
  Administered 2024-09-05: 200 mg via INTRAVENOUS
  Filled 2024-09-04: qty 20

## 2024-09-04 MED ORDER — MAGNESIUM SULFATE 2 GM/50ML IV SOLN
2.0000 g | Freq: Once | INTRAVENOUS | Status: AC
  Administered 2024-09-04: 2 g via INTRAVENOUS
  Filled 2024-09-04: qty 50

## 2024-09-04 MED ORDER — TORSEMIDE 20 MG PO TABS
80.0000 mg | ORAL_TABLET | Freq: Every day | ORAL | Status: DC
  Administered 2024-09-05 – 2024-09-08 (×4): 80 mg via ORAL
  Filled 2024-09-04 (×4): qty 4

## 2024-09-04 NOTE — Plan of Care (Signed)
°  Problem: Education: Goal: Knowledge of General Education information will improve Description: Including pain rating scale, medication(s)/side effects and non-pharmacologic comfort measures Outcome: Progressing   Problem: Health Behavior/Discharge Planning: Goal: Ability to manage health-related needs will improve Outcome: Progressing   Problem: Clinical Measurements: Goal: Ability to maintain clinical measurements within normal limits will improve Outcome: Progressing Goal: Will remain free from infection Outcome: Progressing Goal: Diagnostic test results will improve Outcome: Progressing Goal: Respiratory complications will improve Outcome: Progressing Goal: Cardiovascular complication will be avoided Outcome: Progressing   Problem: Activity: Goal: Risk for activity intolerance will decrease Outcome: Progressing   Problem: Nutrition: Goal: Adequate nutrition will be maintained Outcome: Progressing   Problem: Coping: Goal: Level of anxiety will decrease Outcome: Progressing   Problem: Elimination: Goal: Will not experience complications related to bowel motility Outcome: Progressing Goal: Will not experience complications related to urinary retention Outcome: Progressing   Problem: Pain Managment: Goal: General experience of comfort will improve and/or be controlled Outcome: Progressing   Problem: Safety: Goal: Ability to remain free from injury will improve Outcome: Progressing   Problem: Skin Integrity: Goal: Risk for impaired skin integrity will decrease Outcome: Progressing   Problem: Education: Goal: Knowledge of disease and its progression will improve Outcome: Progressing   Problem: Fluid Volume: Goal: Compliance with measures to maintain balanced fluid volume will improve Outcome: Progressing   Problem: Health Behavior/Discharge Planning: Goal: Ability to manage health-related needs will improve Outcome: Progressing   Problem: Clinical  Measurements: Goal: Complications related to the disease process, condition or treatment will be avoided or minimized Outcome: Progressing   Problem: Education: Goal: Knowledge of disease and its progression will improve Outcome: Progressing   Problem: Health Behavior/Discharge Planning: Goal: Ability to manage health-related needs will improve Outcome: Progressing   Problem: Clinical Measurements: Goal: Complications related to the disease process or treatment will be avoided or minimized Outcome: Progressing Goal: Dialysis access will remain free of complications Outcome: Progressing   Problem: Activity: Goal: Activity intolerance will improve Outcome: Progressing   Problem: Fluid Volume: Goal: Fluid volume balance will be maintained or improved Outcome: Progressing   Problem: Nutritional: Goal: Ability to make appropriate dietary choices will improve Outcome: Progressing   Problem: Respiratory: Goal: Respiratory symptoms related to disease process will be avoided Outcome: Progressing   Problem: Self-Concept: Goal: Body image disturbance will be avoided or minimized Outcome: Progressing   Problem: Urinary Elimination: Goal: Progression of disease will be identified and treated Outcome: Progressing

## 2024-09-04 NOTE — Progress Notes (Signed)
 Pt back form IR to room after removal of right chest catheter. Clean sterile gauze dsg CDI to right chest. No bleeding or stain noted. Site level 0, no hematoma, ecchymosis or bruising noted. MYRTIS Val Roof RN   09/04/24 1218  Vitals  Temp 98.5 F (36.9 C)  Temp Source Oral  BP (!) 133/43  MAP (mmHg) 70  BP Location Left Arm  BP Method Automatic  Patient Position (if appropriate) Lying  Pulse Rate 69  Pulse Rate Source Monitor  ECG Heart Rate 70  Resp 20  Level of Consciousness  Level of Consciousness Alert  MEWS COLOR  MEWS Score Color Green  Oxygen Therapy  SpO2 95 %  O2 Device Room Air  Pain Assessment  Pain Scale 0-10  Pain Score 0  MEWS Score  MEWS Temp 0  MEWS Systolic 0  MEWS Pulse 0  MEWS RR 0  MEWS LOC 0  MEWS Score 0

## 2024-09-04 NOTE — Progress Notes (Signed)
 PROGRESS NOTE    Dean Croft Sr.  FMW:[id number] DOB: 1951-03-10 DOA: 08/22/2024 PCP: [name], MD   Brief Narrative: This 73 yrs old male with PMH significant for CAD, PAF, ICM, cardiac arrest s/p AICD, HFrEF (EF 35-40% in 04/2024), hypertension, hyperlipidemia, type 2 diabetes, obstructive sleep apnea and recent admission on 07/16/24 for HFrEF exacerbation presented to hospital with back pain after falling from the wheelchair.  He was noted to have acute kidney injury and T6 fracture with lower extremity edema.  Patient's hospital stay was complicated with heart failure with worsening renal functions and cardiology and nephrology were consulted.  Patient had hemodialysis initiated for volume management and underwent thoracocentesis on 11-28 and 12-5 for worsening right pleural effusion.  Also required IV iron .     At this time, patient is being followed by cardiology and is on dobutamine , lasix  drip and undergoing hemodialysis for volume management.  Assessment & Plan:   Principal Problem:   Multiple traumatic injuries Active Problems:   Cardiorenal syndrome with renal failure   Cardiorenal syndrome  Cardiorenal syndrome with renal failure: Low output biventricular heart failure: Cardiology following.  Last 2D echo showed  EF of 35 to 40%.  Cardiac catheterization 06/19/24 showed moderate pulmonary hypertension and low cardiac output.  Patient was initially on milrinone  drip with cardiology which was subsequently discontinued but at this time has been started on dobutamine .  Continue strict intake and output charting.  Patient had 1600 mL urinary output in the last 24 hours.  Denies any chest pain,  ambulating much better with physical therapy. Continue dobutamine  infusion, Lasix  200 mg twice daily.   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 Follow nephrology recommendation.  Continue intake and output, check daily  weights.  Patient follows up with Dr. [name] as outpatient.  Latest creatinine of 1.5 and trending down.   He was started on Lasix  drip.  It is changed to Lasix  IV 200 mg twice daily.  No need for further dialysis.  TDC catheter will be removed today.  Plan to switch to high-dose torsemide  tomorrow.   Acute on chronic anemia : Likely from CKD and iron  deficiency.  Received IV iron .  Latest hemoglobin of 7.5 from 7.1.  Status post Aranesp    Acute T6 fracture, physical deconditioning: Neurosurgery was consulted.  Recommended TSLO brace and outpatient follow-up with neurosurgery Dr. [name].   Continue PT, OT while in the hospital.   R pleural effusion: RML/RLL consolidation Patient underwent IR guided thoracentesis on 11/28 with removal of 500 mL of bloody fluid.  Patient also had repeat thoracentesis on 12/5, with removal of 1.5L of bloody fluid.  Currently on room air and remained stable.   Hyperkalemia : Resolved and now with mild hypokalemia.  Will replenish orally.  Check BMP in AM.   Liver enhancement on imaging: Could be secondary to amiodarone .   PAF: Patient on amiodarone  200mg  daily.   Patient was on Eliquis  5mg  BID (currently held) due to anemia.   DVT prophylaxis: Heparin  Code Status: DNR Family Communication: No family at bed side Disposition Plan:    Status is: Inpatient Remains inpatient appropriate because: Severity of illness.  Patient is not medically ready for discharge   Consultants:  Cardiology Nephrology  Procedures:  Antimicrobials: Anti-infectives (From admission, onward)    Start     Dose/Rate Route Frequency Ordered Stop   08/28/24 1345  ceFAZolin  (ANCEF ) IVPB 2g/100 mL premix  2 g 200 mL/hr over 30 Minutes Intravenous To Radiology 08/28/24 1247 08/28/24 1545      Subjective: Patient was seen and examined at bedside. Overnight events noted. Patient was sitting comfortably on the edge of the bed, states he is feeling better. He remains  on dobutamine  and Lasix  drip.  Objective: Vitals:   09/04/24 0308 09/04/24 0500 09/04/24 0638 09/04/24 0700  BP: (!) 123/40  (!) 140/44 (!) 134/49  Pulse: 68  71 67  Resp: 17  18 18   Temp: 97.8 F (36.6 C)  97.8 F (36.6 C) 98.1 F (36.7 C)  TempSrc: Oral  Oral Oral  SpO2: 98%  99% 100%  Weight:  90.6 kg    Height:        Intake/Output Summary (Last 24 hours) at 09/04/2024 1125 Last data filed at 09/04/2024 0650 Gross per 24 hour  Intake 815.05 ml  Output 800 ml  Net 15.05 ml   Filed Weights   09/02/24 0500 09/03/24 0500 09/04/24 0500  Weight: 92.5 kg 91.3 kg 90.6 kg   Examination:  General exam: Appears calm and comfortable, not in any acute distress. Respiratory system: CTA Bilaterally. Respiratory effort normal. RR 17 Cardiovascular system: S1 & S2 heard, RRR. No JVD, murmurs, rubs, gallops or clicks.  Gastrointestinal system: Abdomen is non distended, soft and non tender. Normal bowel sounds heard. Central nervous system: Alert and oriented x 3. No focal neurological deficits. Extremities: No edema, no cyanosis, no clubbing. Skin: No rashes, lesions or ulcers Psychiatry: Judgement and insight appear normal. Mood & affect appropriate.   Data Reviewed: I have personally reviewed following labs and imaging studies  CBC: Recent Labs  Lab 08/29/24 1741 09/01/24 0558 09/02/24 0500 09/03/24 0529 09/04/24 0510  WBC  --  6.2 7.2 7.5 7.8  HGB 7.5* 7.2* 7.1* 7.5* 7.8*  HCT 22.0* 22.4* 22.6* 23.9* 24.7*  MCV  --  92.9 94.6 94.8 95.0  PLT  --  126* 147* 145* 153   Basic Metabolic Panel: Recent Labs  Lab 08/30/24 0555 08/31/24 0830 09/01/24 0558 09/02/24 0500 09/03/24 0529 09/04/24 0500 09/04/24 0510  NA 135 135 134* 132* 132*  133* 131* 132*  K 3.7 3.9 3.9 3.7 3.3*  3.3* 3.5 3.6  CL 98 97* 97* 97* 96*  94* 96* 96*  CO2 25 26 25  20* 30  28 27 27   GLUCOSE 125* 86 89 92 98  95 104* 102*  BUN 41* 29* 33* 38* 19  21 23 23   CREATININE 2.54* 2.38* 2.50*  2.48* 1.53*  1.48* 1.69* 1.68*  CALCIUM  9.1 9.3 9.5 9.0 9.1  8.7* 9.0 9.0  MG 1.9  --  1.8 1.8 1.7  --  1.8  PHOS 2.5 2.7 3.2 3.1 2.0* 2.1*  --    GFR: Estimated Creatinine Clearance: 40.6 mL/min (A) (by C-G formula based on SCr of 1.68 mg/dL (H)). Liver Function Tests: Recent Labs  Lab 08/31/24 0830 09/01/24 0558 09/02/24 0500 09/03/24 0529 09/04/24 0500  ALBUMIN  3.4* 3.2* 3.0* 3.1* 3.3*   No results for input(s): LIPASE, AMYLASE in the last 168 hours. No results for input(s): AMMONIA in the last 168 hours. Coagulation Profile: No results for input(s): INR, PROTIME in the last 168 hours. Cardiac Enzymes: No results for input(s): CKTOTAL, CKMB, CKMBINDEX, TROPONINI in the last 168 hours. BNP (last 3 results) No results for input(s): PROBNP in the last 8760 hours. HbA1C: No results for input(s): HGBA1C in the last 72 hours. CBG: No results for input(s): GLUCAP in  the last 168 hours. Lipid Profile: No results for input(s): CHOL, HDL, LDLCALC, TRIG, CHOLHDL, LDLDIRECT in the last 72 hours. Thyroid Function Tests: No results for input(s): TSH, T4TOTAL, FREET4, T3FREE, THYROIDAB in the last 72 hours. Anemia Panel: No results for input(s): VITAMINB12, FOLATE, FERRITIN, TIBC, IRON , RETICCTPCT in the last 72 hours. Sepsis Labs: No results for input(s): PROCALCITON, LATICACIDVEN in the last 168 hours.  No results found for this or any previous visit (from the past 240 hours).   Radiology Studies: No results found. Scheduled Meds:  acetaminophen   650 mg Oral Q6H   amiodarone   200 mg Oral Daily   atorvastatin   40 mg Oral Daily   Chlorhexidine  Gluconate Cloth  6 each Topical Daily   Chlorhexidine  Gluconate Cloth  6 each Topical Q0600   Chlorhexidine  Gluconate Cloth  6 each Topical Q0600   collagenase    Topical Daily   darbepoetin (ARANESP ) injection - DIALYSIS  150 mcg Subcutaneous Q Tue-1800   heparin  injection  (subcutaneous)  5,000 Units Subcutaneous Q8H   heparin  sodium (porcine)  10,000 Units Intracatheter Once   hydrocerin   Topical BID   lidocaine -EPINEPHrine   20 mL Intradermal Once   lidocaine -EPINEPHrine   20 mL Intradermal Once   polyethylene glycol  17 g Oral Daily   sodium chloride  flush  10-40 mL Intracatheter Q12H   [START ON 09/05/2024] torsemide   80 mg Oral Daily   Continuous Infusions:  DOBUTamine  2.5 mcg/kg/min (09/04/24 1016)   furosemide      iron  sucrose Stopped (09/03/24 1137)     LOS: 13 days    Time spent: 50 mins    Darcel Dawley, MD [facility]   If 7PM-7AM, please contact night-coverage

## 2024-09-04 NOTE — Progress Notes (Addendum)
 RUE PICC troubleshooting: Red lumen occluded despite position changes and cap change. Discussed with MD, CXR ordered. RN to notify VASteam when CXR is complete.

## 2024-09-04 NOTE — Progress Notes (Signed)
 Advanced Heart Failure Rounding Note  Cardiologist: Dean JAYSON Maxcy, MD  Chief Complaint: Acute renal failure Subjective:    800cc UOP x2 occurrences. Weight down 2 lbs. Sitting on the side of the bed. No SOB. Ambulating much better with PT.   Objective:    Weight Range: 90.6 kg Body mass index is 32.24 kg/m.   Vital Signs:   Temp:  [97.6 F (36.4 C)-98.5 F (36.9 C)] 97.8 F (36.6 C) (12/11 9361) Pulse Rate:  [62-75] 71 (12/11 0638) Resp:  [12-25] 18 (12/11 9361) BP: (123-140)/(40-70) 140/44 (12/11 0638) SpO2:  [93 %-99 %] 99 % (12/11 9361) Weight:  [90.6 kg] 90.6 kg (12/11 0500) Last BM Date : 09/02/24  Weight change: Filed Weights   09/02/24 0500 09/03/24 0500 09/04/24 0500  Weight: 92.5 kg 91.3 kg 90.6 kg   Intake/Output:  Intake/Output Summary (Last 24 hours) at 09/04/2024 0719 Last data filed at 09/04/2024 0650 Gross per 24 hour  Intake 1055.05 ml  Output 800 ml  Net 255.05 ml    Physical Exam    General: Elderly appearing. No distress  Cardiac: JVP ~10cm. No murmurs  Extremities: Warm and dry.  3+ BLE edema.  Neuro: A&O x3. Affect pleasant.   Telemetry   SR 60-70s (personally reviewed)  Labs    CBC Recent Labs    09/03/24 0529 09/04/24 0510  WBC 7.5 7.8  HGB 7.5* 7.8*  HCT 23.9* 24.7*  MCV 94.8 95.0  PLT 145* 153   Basic Metabolic Panel Recent Labs    87/89/74 0529 09/04/24 0500 09/04/24 0510  NA 132*  133* 131* 132*  K 3.3*  3.3* 3.5 3.6  CL 96*  94* 96* 96*  CO2 30  28 27 27   GLUCOSE 98  95 104* 102*  BUN 19  21 23 23   CREATININE 1.53*  1.48* 1.69* 1.68*  CALCIUM  9.1  8.7* 9.0 9.0  MG 1.7  --  1.8  PHOS 2.0* 2.1*  --    Liver Function Tests Recent Labs    09/03/24 0529 09/04/24 0500  ALBUMIN  3.1* 3.3*   BNP (last 3 results) Recent Labs    06/25/24 1604 07/10/24 0227 08/22/24 0903  BNP 1,585.3* 1,952.1* 525.0*   Medications:    Scheduled Medications:  acetaminophen   650 mg Oral Q6H   amiodarone    200 mg Oral Daily   atorvastatin   40 mg Oral Daily   Chlorhexidine  Gluconate Cloth  6 each Topical Daily   Chlorhexidine  Gluconate Cloth  6 each Topical Q0600   Chlorhexidine  Gluconate Cloth  6 each Topical Q0600   collagenase    Topical Daily   darbepoetin (ARANESP ) injection - DIALYSIS  150 mcg Subcutaneous Q Tue-1800   heparin  injection (subcutaneous)  5,000 Units Subcutaneous Q8H   heparin  sodium (porcine)  10,000 Units Intracatheter Once   hydrocerin   Topical BID   lidocaine -EPINEPHrine   20 mL Intradermal Once   lidocaine -EPINEPHrine   20 mL Intradermal Once   polyethylene glycol  17 g Oral Daily   sodium chloride  flush  10-40 mL Intracatheter Q12H    Infusions:  DOBUTamine  2.5 mcg/kg/min (09/04/24 0644)   furosemide  200 mg (09/03/24 1837)   iron  sucrose Stopped (09/03/24 1137)    PRN Medications: HYDROmorphone  (DILAUDID ) injection, methocarbamol , mouth rinse, oxyCODONE , sodium chloride  flush  Assessment/Plan   Acute on chronic systolic heart failure: Primarily ischemic, prior history of infarcted RCA.  Multiple recent admission requiring milrinone . Now with progressive cardio-renal syndrome requiring HD.  - Most recent TTE 8/25  EF 35-40%, RV mildly reduced. TEE 9/25 with moderate MR - initially started on milrinone  for co-ox 59, stopped d/t hypotension and renal function - restarted on milrinone  12/8 for again co-ox 48% - switched milrinone  to dobutamine  12/9 - continue dobutamine  2.5  - making some urine; continue IV Lasix  200 mg bid, switch to high dose torsemide  tomorrow - UNNA boots - nephrology feels that with inotrope he has a chance for renal recovery - GDMT limited by CKD    Mitral regurgitation: 2/2 ischemic disease. TEE 9/25 with more moderate MR by PISA, valve area, systolic blunting of pulmonary veins. 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 Now with progressive cardio-renal disease. Nephrology feels that he has a change for renal recovery with  inotrope support. 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 Metastatic dz, though liver cirrhosis may be 2/2 HF - differ further w/u to IM/PCP    Anemia of Chronic Disease - hgb stable in 7s - IV iron  per primary  Length of Stay: 27  Ethelyne Erich, NP  09/04/2024, 7:19 AM  Advanced Heart Failure Team Pager [phone] (M-F; 7a - 5p)  Please contact [facility] for night-coverage after hours (5p -7a ) and weekends on [email or web address]

## 2024-09-04 NOTE — TOC Initial Note (Addendum)
 Transition of Care (TOC) - Initial/Assessment Note    Patient Details  Name: Dean Veselka Sr. MRN: [id number] Date of Birth: 22-Jun-1951  Transition of Care [facility]) CM/SW Contact:    Justina Delcia Czar, RN Phone Number: [phone] 09/04/2024, 5:12 PM  Clinical Narrative:                 Spoke to pt and states he was independent at home. Driving to appts. States his daughters will be in the home to assist and take him to appts. Two dtrs live in home with patient.   Offered choice for DME and HH. Medicare.gov list placed on chart and provided to pt. Pt agreeable to agency that will accept referral. Will need [facility] RN and PT orders with F2F. Contacted [facility] rep, Pam with new referral for Home Dobutamine .   Pt requesting a walker for home. His walker at home is not working properly.   Received call back [facility], Pam, she will be following for Home Dobutamine .   Expected Discharge Plan: Home w Home Health Services Barriers to Discharge: Continued Medical Work up   Patient Goals and CMS Choice Patient states their goals for this hospitalization and ongoing recovery are:: wants to remain independent CMS Medicare.gov Compare Post Acute Care list provided to:: Patient Choice offered to / list presented to : NA [place] ownership interest in [facility].provided to:: Patient    Expected Discharge Plan and Services   Discharge Planning Services: CM Consult Post Acute Care Choice: Home Health Living arrangements for the past 2 months: Single Family Home                           HH Arranged: RN, PT          Prior Living Arrangements/Services Living arrangements for the past 2 months: Single Family Home Lives with:: Relatives Patient language and need for interpreter reviewed:: Yes Do you feel safe going back to the place where you live?: Yes      Need for Family Participation in Patient Care: Yes (Comment) Care giver support system in place?:  Yes (comment) Current home services: DME (cane, walker, wheelchair) Criminal Activity/Legal Involvement Pertinent to Current Situation/Hospitalization: No - Comment as needed  Activities of Daily Living   ADL Screening (condition at time of admission) Independently performs ADLs?: No Does the patient have a NEW difficulty with bathing/dressing/toileting/self-feeding that is expected to last >3 days?: Yes (Initiates electronic notice to provider for possible OT consult) Does the patient have a NEW difficulty with getting in/out of bed, walking, or climbing stairs that is expected to last >3 days?: Yes (Initiates electronic notice to provider for possible PT consult) Does the patient have a NEW difficulty with communication that is expected to last >3 days?: Yes (Initiates electronic notice to provider for possible SLP consult) Is the patient deaf or have difficulty hearing?: No Does the patient have difficulty seeing, even when wearing glasses/contacts?: No Does the patient have difficulty concentrating, remembering, or making decisions?: No  Permission Sought/Granted Permission sought to share information with : Case Manager, Family Supports, PCP Permission granted to share information with : Yes, Verbal Permission Granted  Share Information with NAME: Trysten Bernard  Permission granted to share info w AGENCY: Home Health, DME, PCP  Permission granted to share info w Relationship: daughters  Permission granted to share info w Contact Information: [phone]  Emotional Assessment Appearance:: Appears stated age Attitude/Demeanor/Rapport: Engaged Affect (  typically observed): Accepting Orientation: : Oriented to Self, Oriented to Place, Oriented to  Time, Oriented to Situation   Psych Involvement: No (comment)  Admission diagnosis:  Pleural effusion on right [J90] Fall down stairs, initial encounter [W10.8XXA] Multiple traumatic injuries [T07.XXXA] Closed fracture of thoracic  vertebral body (HCC) [S22.009A] Patient Active Problem List   Diagnosis Date Noted   Cardiorenal syndrome 08/27/2024   Cardiorenal syndrome with renal failure 08/25/2024   Multiple traumatic injuries 08/22/2024   Malnutrition of moderate degree 07/12/2024   HFrEF (heart failure with reduced ejection fraction) (HCC) 07/10/2024   Thrombocytopenia 07/10/2024   Anemia of chronic disease 07/10/2024   Abnormal CT of the chest 07/10/2024   Acute on chronic systolic CHF (congestive heart failure) (HCC) 05/04/2024   SOB (shortness of breath) 05/04/2024   Elevated troponin 05/04/2024   High anion gap metabolic acidosis 05/04/2024   Paroxysmal atrial fibrillation (HCC) 05/04/2024   Essential hypertension 05/04/2024   Obesity, class 1 05/04/2024   Acute on chronic heart failure (HCC) 04/20/2024   Acute GI bleeding 11/17/2023   Ventricular tachycardia (HCC) 08/24/2022   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/2020   Atrial fibrillation, chronic (HCC) 08/12/2020   Thoracic spine fracture (HCC) 06/29/2020   Hyperkalemia 10/23/2019   Diarrhea 10/23/2019   CHF (congestive heart failure) (HCC) 06/04/2019   Acute systolic CHF (congestive heart failure) (HCC) 06/04/2019   Severe sepsis (HCC) 05/06/2019   Acute asthma exacerbation 05/06/2019   Acute respiratory distress 05/06/2019   Acute hypoxic respiratory failure (HCC) 04/06/2019   HCAP (healthcare-associated pneumonia) 04/06/2019   Acute kidney injury superimposed on chronic kidney disease 02/27/2019   Chronic combined systolic and diastolic CHF (congestive heart failure) (HCC) 02/27/2019   CKD (chronic kidney disease), stage III (HCC) 05/16/2018   Normochromic normocytic anemia 05/15/2018   CAD (coronary artery disease) 12/17/2017   HLD (hyperlipidemia) 12/17/2017   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   Cardiomyopathy, ischemic    Generalized weakness 10/04/2016   Stage 3 chronic kidney disease  (HCC)    Chronic kidney disease, stage 3b (HCC)    Disorientated    Anoxic brain injury (HCC) 09/15/2016   Benign essential HTN    OSA (obstructive sleep apnea)    Transaminitis    Chronic respiratory failure with hypoxia (HCC)    Fever    Acute cystitis    NSVT (nonsustained ventricular tachycardia) (HCC)    Uncomplicated asthma    Type 2 diabetes mellitus in patient with obesity (HCC)    Acute on chronic combined systolic and diastolic CHF (congestive heart failure) (HCC)    Tachypnea    Hypokalemia    Leukocytosis    Hypoxemia    Acute respiratory failure (HCC)    Cardiopulmonary arrest (HCC) 08/27/2016   Type 2 diabetes mellitus with hyperlipidemia (HCC) 03/02/2014   Class 2 obesity due to excess calories with body mass index (BMI) of 35.0 to 35.9 in adult 03/02/2014   Essential hypertension, benign 03/02/2014   PCP:  [name], MD Pharmacy:   [facility] [phone] [address], [place] - [address] [place] [address] Phone: [phone] Fax: [phone]     Social Drivers of Health (SDOH) Social History: SDOH Screenings   Food Insecurity: Patient Declined (08/22/2024)  Housing: Unknown (08/22/2024)  Transportation Needs: Patient Declined (08/22/2024)  Utilities: Patient Declined (08/22/2024)  Social Connections: Unknown (08/22/2024)  Tobacco Use: Low Risk (08/22/2024)   SDOH Interventions:     Readmission Risk Interventions  05/09/2024    9:53 AM 04/22/2024   11:11 AM  Readmission Risk Prevention Plan  Transportation Screening Complete Complete  PCP or Specialist Appt within 5-7 Days  Complete  Home Care Screening  Complete  Medication Review (RN CM)  Complete  Medication Review (RN Care Manager) Complete   PCP or Specialist appointment within 3-5 days of discharge Complete   HRI or Home Care Consult Complete   SW Recovery Care/Counseling Consult Complete   Palliative Care Screening Not Applicable   Skilled Nursing  Facility Not Applicable

## 2024-09-04 NOTE — Progress Notes (Signed)
°   09/04/24 0638  Vitals  Temp 97.8 F (36.6 C)  Temp Source Oral  BP (!) 140/44  MAP (mmHg) 70  BP Location Left Arm  BP Method Automatic  Patient Position (if appropriate) Sitting  Pulse Rate 71  Pulse Rate Source Monitor  ECG Heart Rate 71  Resp 18  Level of Consciousness  Level of Consciousness Alert  MEWS COLOR  MEWS Score Color Green  Oxygen Therapy  SpO2 99 %  O2 Device Room Air  Pain Assessment  Pain Scale 0-10  Pain Score 3  Pain Type Chronic pain  Pain Location Back  Pain Descriptors / Indicators Discomfort  Pain Frequency Intermittent  Pain Onset On-going  Patients Stated Pain Goal 0  Pain Intervention(s) Medication (See eMAR);Guided imagery  Multiple Pain Sites No  MEWS Score  MEWS Temp 0  MEWS Systolic 0  MEWS Pulse 0  MEWS RR 0  MEWS LOC 0  MEWS Score 0   Dobutamine  infusion continues.

## 2024-09-04 NOTE — Procedures (Signed)
 Successful removal of right IJ tunneled HD catheter.   After obtaining consent and performing a time-out, the right upper chest was prepped and draped in the normal sterile fashion. The heparin  was removed from both ports. 1% lidocaine  was used for local anesthesia. Using gentle manual traction the cuff of the catheter was exposed and the catheter was removed in its entirety. Pressure was held until hemostasis was obtained. A sterile dressing was applied. The patient tolerated the procedure well with no immediate complications.   Warren Dais, AGACNP-BC 09/04/2024, 12:17 PM

## 2024-09-04 NOTE — Progress Notes (Signed)
 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)--> cardiorenal syndrome, decompensated CHF - TTE 04/2024 with EF 35-40% - cardiorenal seems to be more at play here - discussed with AHF- not uremic so if cardiac function can be optimized we may possibly get him off dialysis, especially if his Cr is steady - HD 12/9-> then will reassess needs with med modification - dobutamine  2.5--> strict I/O No need for further dialysis- will d/c TDC  #Acute on chronic CHF - milrinone  originally - switched to dobutamine  - Lasix  - HD 12/9   # Acute hypoxic respiratory failure  - on supplemental oxygen  - improving   # Hyperkalemia - resolved   # Anemia of CKD - getting 1000 mg IV iron  - increase aranesp  to 150 mcg q Tuesday   Subjective:    Seen in room.  Cr improved, UOP improved with Lasix  and dobutamine .  No fruther dialysis needed- will d/c TDC   Objective:   BP (!) 134/49   Pulse 67   Temp 98.1 F (36.7 C) (Oral)   Resp 18   Ht 5' 6 (1.676 m)   Wt 90.6 kg   SpO2 100%   BMI 32.24 kg/m   Intake/Output Summary (Last 24 hours) at 09/04/2024 1009 Last data filed at 09/04/2024 0650 Gross per 24 hour  Intake 815.05 ml  Output 800 ml  Net 15.05 ml   Weight change: -0.7 kg  Physical Exam: Gen:NAD CVS: RRR Resp: muffled bilateral bases Abd: distended Ext:in UNNA boots removed ACCESS: R internal jugular TDC  Imaging: No results found.  Labs: BMET Recent Labs  Lab 08/30/24 0555 08/31/24 0830 09/01/24 0558 09/02/24 0500 09/03/24 0529 09/04/24 0500 09/04/24 0510  NA 135 135 134* 132* 132*  133* 131* 132*  K 3.7 3.9 3.9 3.7 3.3*  3.3* 3.5 3.6  CL 98 97* 97* 97* 96*  94* 96* 96*  CO2 25 26 25  20* 30  28 27 27   GLUCOSE 125* 86 89 92 98  95 104* 102*  BUN 41* 29* 33* 38* 19  21 23 23   CREATININE 2.54* 2.38* 2.50* 2.48* 1.53*  1.48* 1.69* 1.68*  CALCIUM  9.1 9.3 9.5 9.0 9.1  8.7* 9.0 9.0  PHOS 2.5 2.7 3.2  3.1 2.0* 2.1*  --    CBC Recent Labs  Lab 09/01/24 0558 09/02/24 0500 09/03/24 0529 09/04/24 0510  WBC 6.2 7.2 7.5 7.8  HGB 7.2* 7.1* 7.5* 7.8*  HCT 22.4* 22.6* 23.9* 24.7*  MCV 92.9 94.6 94.8 95.0  PLT 126* 147* 145* 153    Medications:     acetaminophen   650 mg Oral Q6H   amiodarone   200 mg Oral Daily   atorvastatin   40 mg Oral Daily   Chlorhexidine  Gluconate Cloth  6 each Topical Daily   Chlorhexidine  Gluconate Cloth  6 each Topical Q0600   Chlorhexidine  Gluconate Cloth  6 each Topical Q0600   collagenase    Topical Daily   darbepoetin (ARANESP ) injection - DIALYSIS  150 mcg Subcutaneous Q Tue-1800   heparin  injection (subcutaneous)  5,000 Units Subcutaneous Q8H   heparin  sodium (porcine)  10,000 Units Intracatheter Once   hydrocerin   Topical BID   lidocaine -EPINEPHrine   20 mL Intradermal Once   lidocaine -EPINEPHrine   20 mL Intradermal Once   polyethylene glycol  17 g Oral Daily   sodium chloride  flush  10-40 mL Intracatheter Q12H   [START ON 09/05/2024] torsemide   80 mg Oral Daily  Almarie Bonine, MD 09/04/2024, 10:09 AM

## 2024-09-05 ENCOUNTER — Inpatient Hospital Stay (HOSPITAL_COMMUNITY)

## 2024-09-05 HISTORY — PX: IR TUNNELED CENTRAL VENOUS CATH PLC W IMG: IMG1939

## 2024-09-05 LAB — RENAL FUNCTION PANEL
Albumin: 3.3 g/dL — ABNORMAL LOW (ref 3.5–5.0)
Anion gap: 11 (ref 5–15)
BUN: 27 mg/dL — ABNORMAL HIGH (ref 8–23)
CO2: 25 mmol/L (ref 22–32)
Calcium: 9.4 mg/dL (ref 8.9–10.3)
Chloride: 96 mmol/L — ABNORMAL LOW (ref 98–111)
Creatinine, Ser: 1.95 mg/dL — ABNORMAL HIGH (ref 0.61–1.24)
GFR, Estimated: 35 mL/min — ABNORMAL LOW (ref >60)
Glucose, Bld: 89 mg/dL (ref 70–99)
Phosphorus: 2.2 mg/dL — ABNORMAL LOW (ref 2.5–4.6)
Potassium: 3.6 mmol/L (ref 3.5–5.1)
Sodium: 132 mmol/L — ABNORMAL LOW (ref 135–145)

## 2024-09-05 LAB — BASIC METABOLIC PANEL WITH GFR
Anion gap: 11 (ref 5–15)
BUN: 26 mg/dL — ABNORMAL HIGH (ref 8–23)
CO2: 25 mmol/L (ref 22–32)
Calcium: 9.4 mg/dL (ref 8.9–10.3)
Chloride: 96 mmol/L — ABNORMAL LOW (ref 98–111)
Creatinine, Ser: 1.92 mg/dL — ABNORMAL HIGH (ref 0.61–1.24)
GFR, Estimated: 36 mL/min — ABNORMAL LOW (ref >60)
Glucose, Bld: 91 mg/dL (ref 70–99)
Potassium: 3.6 mmol/L (ref 3.5–5.1)
Sodium: 132 mmol/L — ABNORMAL LOW (ref 135–145)

## 2024-09-05 LAB — CBC
HCT: 23.6 % — ABNORMAL LOW (ref 39.0–52.0)
HCT: 24 % — ABNORMAL LOW (ref 39.0–52.0)
Hemoglobin: 7.5 g/dL — ABNORMAL LOW (ref 13.0–17.0)
Hemoglobin: 7.6 g/dL — ABNORMAL LOW (ref 13.0–17.0)
MCH: 30.2 pg (ref 26.0–34.0)
MCH: 29.6 pg (ref 26.0–34.0)
MCHC: 31.8 g/dL (ref 30.0–36.0)
MCHC: 31.7 g/dL (ref 30.0–36.0)
MCV: 95.2 fL (ref 80.0–100.0)
MCV: 93.4 fL (ref 80.0–100.0)
Platelets: 118 K/uL — ABNORMAL LOW (ref 150–400)
Platelets: 144 K/uL — ABNORMAL LOW (ref 150–400)
RBC: 2.48 MIL/uL — ABNORMAL LOW (ref 4.22–5.81)
RBC: 2.57 MIL/uL — ABNORMAL LOW (ref 4.22–5.81)
RDW: 17.6 % — ABNORMAL HIGH (ref 11.5–15.5)
RDW: 17.4 % — ABNORMAL HIGH (ref 11.5–15.5)
WBC: 7.6 K/uL (ref 4.0–10.5)
WBC: 8.2 K/uL (ref 4.0–10.5)
nRBC: 0 % (ref 0.0–0.2)
nRBC: 0 % (ref 0.0–0.2)

## 2024-09-05 LAB — COOXEMETRY PANEL
Carboxyhemoglobin: 2.4 % — ABNORMAL HIGH (ref 0.5–1.5)
Methemoglobin: <0.7 % (ref 0.0–1.5)
O2 Saturation: 65.1 %
Total hemoglobin: 8.1 g/dL — ABNORMAL LOW (ref 12.0–16.0)

## 2024-09-05 LAB — PHOSPHORUS: Phosphorus: 2.2 mg/dL — ABNORMAL LOW (ref 2.5–4.6)

## 2024-09-05 LAB — MAGNESIUM: Magnesium: 2 mg/dL (ref 1.7–2.4)

## 2024-09-05 MED ORDER — LIDOCAINE-EPINEPHRINE 1 %-1:100000 IJ SOLN
INTRAMUSCULAR | Status: AC
  Filled 2024-09-05: qty 1

## 2024-09-05 MED ORDER — APIXABAN 5 MG PO TABS
5.0000 mg | ORAL_TABLET | Freq: Two times a day (BID) | ORAL | Status: DC
  Administered 2024-09-05 – 2024-09-09 (×10): 5 mg via ORAL
  Filled 2024-09-05 (×10): qty 1

## 2024-09-05 MED ORDER — POTASSIUM CHLORIDE CRYS ER 20 MEQ PO TBCR
40.0000 meq | EXTENDED_RELEASE_TABLET | Freq: Once | ORAL | Status: AC
  Administered 2024-09-05: 40 meq via ORAL
  Filled 2024-09-05: qty 2

## 2024-09-05 MED ORDER — LIDOCAINE-EPINEPHRINE 1 %-1:100000 IJ SOLN
20.0000 mL | Freq: Once | INTRAMUSCULAR | Status: AC
  Administered 2024-09-05: 10 mL

## 2024-09-05 NOTE — Progress Notes (Signed)
 Advanced Heart Failure Rounding Note  Cardiologist: Vinie JAYSON Maxcy, MD  HF Cardiologist: Dr. [name]   Chief Complaint: Acute renal failure  Subjective:    Scr slightly worse, 1.95. Co-ox pending. Weight down 2 lb. Is/Os likely not complete.  Sitting up in chair. Legs are more edematous today. Able to ambulate with PT this am.    Objective:    Weight Range: 90.6 kg Body mass index is 32.24 kg/m.   Vital Signs:   Temp:  [97.6 F (36.4 C)-98.5 F (36.9 C)] 97.9 F (36.6 C) (12/12 0331) Pulse Rate:  [69-78] 70 (12/12 0331) Resp:  [17-28] 28 (12/12 0400) BP: (120-133)/(43-61) 124/52 (12/12 0331) SpO2:  [95 %-100 %] 97 % (12/12 0331) Last BM Date : 09/04/24  Weight change: Filed Weights   09/02/24 0500 09/03/24 0500 09/04/24 0500  Weight: 92.5 kg 91.3 kg 90.6 kg   Intake/Output:  Intake/Output Summary (Last 24 hours) at 09/05/2024 0802 Last data filed at 09/05/2024 9349 Gross per 24 hour  Intake 1002.71 ml  Output 550 ml  Net 452.71 ml    Physical Exam    General:  Chronically ill appearing elderly male Cor: JVP ~ 10 cm. Regular rate & rhythm. No murmurs. Lungs: breathing nonlabored Abdomen: obese, soft, nontender, nondistended. Extremities: 2+ lower extremity edema, + UNNA boot on left Neuro: alert & orientedx3. Affect pleasant   Telemetry   SR 60s-70s  Labs    CBC Recent Labs    09/03/24 0529 09/04/24 0510  WBC 7.5 7.8  HGB 7.5* 7.8*  HCT 23.9* 24.7*  MCV 94.8 95.0  PLT 145* 153   Basic Metabolic Panel Recent Labs    87/89/74 0529 09/04/24 0500 09/04/24 0510  NA 132*  133* 131* 132*  K 3.3*  3.3* 3.5 3.6  CL 96*  94* 96* 96*  CO2 30  28 27 27   GLUCOSE 98  95 104* 102*  BUN 19  21 23 23   CREATININE 1.53*  1.48* 1.69* 1.68*  CALCIUM  9.1  8.7* 9.0 9.0  MG 1.7  --  1.8  PHOS 2.0* 2.1*  --    Liver Function Tests Recent Labs    09/03/24 0529 09/04/24 0500  ALBUMIN  3.1* 3.3*   BNP (last 3 results) Recent Labs     06/25/24 1604 07/10/24 0227 08/22/24 0903  BNP 1,585.3* 1,952.1* 525.0*   Medications:    Scheduled Medications:  acetaminophen   650 mg Oral Q6H   amiodarone   200 mg Oral Daily   atorvastatin   40 mg Oral Daily   Chlorhexidine  Gluconate Cloth  6 each Topical Daily   Chlorhexidine  Gluconate Cloth  6 each Topical Q0600   Chlorhexidine  Gluconate Cloth  6 each Topical Q0600   collagenase    Topical Daily   darbepoetin (ARANESP ) injection - DIALYSIS  150 mcg Subcutaneous Q Tue-1800   heparin  injection (subcutaneous)  5,000 Units Subcutaneous Q8H   heparin  sodium (porcine)  10,000 Units Intracatheter Once   hydrocerin   Topical BID   lidocaine -EPINEPHrine   20 mL Intradermal Once   lidocaine -EPINEPHrine   20 mL Intradermal Once   polyethylene glycol  17 g Oral Daily   sodium chloride  flush  10-40 mL Intracatheter Q12H   torsemide   80 mg Oral Daily    Infusions:  DOBUTamine  1.8018 mcg/kg/min (09/04/24 1314)   iron  sucrose Stopped (09/03/24 1137)    PRN Medications: HYDROmorphone  (DILAUDID ) injection, methocarbamol , mouth rinse, oxyCODONE , sodium chloride  flush  Assessment/Plan   Acute on chronic systolic heart failure: Primarily ischemic,  prior history of infarcted RCA.  Multiple recent admission requiring milrinone . Now with progressive cardio-renal syndrome requiring HD.  - Most recent TTE 8/25 EF 35-40%, RV mildly reduced. TEE 9/25 with moderate MR - initially started on milrinone  for co-ox 59, stopped d/t hypotension and renal function - restarted on milrinone  12/8 for again co-ox 48% - switched milrinone  to dobutamine  12/9 - continue dobutamine  2.5, co-ox today pending. Plan to send home with dobutamine  if we can find an agency to accept his insurance. Social work assisting with this. Exchange R brachial PICC for single lumen tunneled PICC today. - making some urine; continue 80 mg torsemide  daily - UNNA boot LLE, has wound on right side - nephrology feels that with inotrope he  has a chance for renal recovery - GDMT limited by CKD - Will not aggressively lower blood pressure. Need to avoid hypotension with advanced kidney disease    Mitral regurgitation: 2/2 ischemic disease. TEE 9/25 with more moderate MR by PISA, valve area, systolic blunting of pulmonary veins. 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 Now with progressive cardio-renal disease. Nephrology feels that he has a change for renal recovery with inotrope support. - Cr up to 5 with anuria - s/p HDC placement and had iHD. Last session 12/19 - Continue po Torsemide  - Will continue to follow with Nephrology in outpatient setting   PAF - Previously diagnosed on device - Continue amio 200 mg daily  - Restart eliquis  5 mg BID. Has been held for anemia. No clear bleeding.     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 No aspirin  with need for anticoagulation   Abnormal Chest CT  - posterior right pulmonary nodule is new since prior study + s/o cirrhosis + hepatic lesions concerning for ? Metastatic dz, though liver cirrhosis may be 2/2 HF - differ further w/u to IM/PCP    Anemia of Chronic Disease - hgb stable in 7s - IV iron  per primary  Stable from HF standpoint. However, will need to find a HH agency to accept his insurance before he can go home with dobutamine . [facility] is out of network for him.  Length of Stay: 14  Shenika Quint N, PA-C  09/05/2024, 8:02 AM  Advanced Heart Failure Team Pager [phone] (M-F; 7a - 5p)  Please contact [facility] for night-coverage after hours (5p -7a ) and weekends on [email or web address]

## 2024-09-05 NOTE — TOC Progression Note (Addendum)
 Transition of Care (TOC) - Progression Note    Patient Details  Name: Dean Mudgett Sr. MRN: [id number] Date of Birth: [birth year]  Transition of Care [facility]) CM/SW Contact  Justina Delcia Czar, RN Phone Number: [phone] 09/05/2024, 4:29 PM  Clinical Narrative:     Received call from [facility] rep, Pam and they are unable to accept due to insurance.  [facility] # [phone] unable to accept due to insurance. [facility] declined due to insurance.  [facility] rep, [phone], or [phone]. Does not provide medication.   [facility] does not accept insurance # [phone].  Updated attending on status, unable to secure an infusion provider that accept his insurance. Office closing at 5 pm.   Expected Discharge Plan: Home w Home Health Services Barriers to Discharge: Continued Medical Work up    Expected Discharge Plan and Services   Discharge Planning Services: CM Consult Post Acute Care Choice: Home Health Living arrangements for the past 2 months: Single Family Home                  HH Arranged: RN, PT      Social Drivers of Health (SDOH) Interventions SDOH Screenings   Food Insecurity: Patient Declined (08/22/2024)  Housing: Unknown (08/22/2024)  Transportation Needs: Patient Declined (08/22/2024)  Utilities: Patient Declined (08/22/2024)  Social Connections: Unknown (08/22/2024)  Tobacco Use: Low Risk (08/22/2024)    Readmission Risk Interventions    05/09/2024    9:53 AM 04/22/2024   11:11 AM  Readmission Risk Prevention Plan  Transportation Screening Complete Complete  PCP or Specialist Appt within 5-7 Days  Complete  Home Care Screening  Complete  Medication Review (RN CM)  Complete  Medication Review (RN Care Manager) Complete   PCP or Specialist appointment within 3-5 days of discharge Complete   HRI or Home Care Consult Complete   SW Recovery Care/Counseling Consult Complete    Palliative Care Screening Not Applicable   Skilled Nursing Facility Not Applicable

## 2024-09-05 NOTE — Progress Notes (Signed)
 Patient off unit for tunneled PICC insertion.

## 2024-09-05 NOTE — Progress Notes (Signed)
 PROGRESS NOTE    [name]  FMW:[id number] DOB: 30-Dec-1950 DOA: 08/22/2024 PCP: [name], MD   Brief Narrative: This 73 yrs old male with PMH significant for CAD, PAF, ICM, cardiac arrest s/p AICD, HFrEF (EF 35-40% in 04/2024), hypertension, hyperlipidemia, type 2 diabetes, obstructive sleep apnea and recent admission on 07/16/24 for HFrEF exacerbation presented to hospital with back pain after falling from the wheelchair.  He was noted to have acute kidney injury and T6 fracture with lower extremity edema.  Patient's hospital stay was complicated with heart failure with worsening renal functions and cardiology and nephrology were consulted.  Patient had hemodialysis initiated for volume management and underwent thoracocentesis on 11-28 and 12-5 for worsening right pleural effusion.  Also required IV iron .     At this time, patient is being followed by cardiology and is on dobutamine  drip. He underwent hemodialysis for volume management. Nephro signed off , No more HD.  Assessment & Plan:   Principal Problem:   Multiple traumatic injuries Active Problems:   Cardiorenal syndrome with renal failure   Cardiorenal syndrome  Cardiorenal syndrome with renal failure: Low output biventricular heart failure: Cardiology following.  Last 2D echo showed  EF of 35 to 40%.  Cardiac catheterization 06/19/24 showed moderate pulmonary hypertension and low cardiac output.  Patient was initially on milrinone  drip with cardiology which was subsequently discontinued but at this time has been started on dobutamine . Continue strict intake and output charting. Denies any chest pain, ambulating much better with physical therapy. Continue dobutamine  infusion, Torsemide  80 mg daily. Plan for home inotropes with placement of tunneled dialysis catheter today.   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 [name] as outpatient.  Latest creatinine of 1.5 and trending down.    Patient briefly required  dialysis.  No need for further dialysis.  TDC catheter removed yesterday. Plan to switch to high-dose torsemide  tomorrow.  Nephrology  signed off.   Acute on chronic anemia: Likely from CKD and iron  deficiency.  Received IV iron .  Latest hemoglobin of 7.6 from 7.1.  Status post Aranesp .   Acute T6 fracture, physical deconditioning: Neurosurgery was consulted.  Recommended TSLO brace and outpatient follow-up with neurosurgery Dr. [name].   Continue PT, OT while in the hospital.   R pleural effusion: RML/RLL consolidation Patient underwent IR guided thoracentesis on 11/28 with removal of 500 mL of bloody fluid.  Patient also had repeat thoracentesis on 12/5, with removal of 1.5L of bloody fluid.  Currently on room air and remained stable.   Hyperkalemia : Resolved and now with mild hypokalemia.  Replaced.  Continue to monitor.   Liver enhancement on imaging: Could be secondary to amiodarone .   PAF: Patient on amiodarone  200mg  daily.   Patient was on Eliquis  5mg  BID (currently held) due to anemia. Resume Eliquis  today.   DVT prophylaxis: Heparin  Code Status: DNR Family Communication: No family at bed side. Disposition Plan:    Status is: Inpatient Remains inpatient appropriate because: Severity of illness.  Patient is not medically ready for discharge   Consultants:  Cardiology Nephrology  Procedures:  Antimicrobials: Anti-infectives (From admission, onward)    Start     Dose/Rate Route Frequency Ordered Stop   08/28/24 1345  ceFAZolin  (ANCEF ) IVPB 2g/100 mL premix        2 g 200 mL/hr over 30 Minutes Intravenous To Radiology 08/28/24 1247 08/28/24 1545      Subjective: Patient was seen and examined at bedside. Overnight events noted. Patient was  sitting comfortably on the edge of the bed, wearing TLSO brace,  He states he is feeling better. He remains on dobutamine  drip  Objective: Vitals:   09/05/24 0400 09/05/24 0829 09/05/24 0847 09/05/24 1114  BP:   (!)  130/58 (!) 133/58  Pulse:  84    Resp: (!) 28  19 15   Temp:   97.9 F (36.6 C) 97.8 F (36.6 C)  TempSrc:   Oral Oral  SpO2:   96% 96%  Weight:      Height:        Intake/Output Summary (Last 24 hours) at 09/05/2024 1429 Last data filed at 09/05/2024 0650 Gross per 24 hour  Intake 560 ml  Output 350 ml  Net 210 ml   Filed Weights   09/02/24 0500 09/03/24 0500 09/04/24 0500  Weight: 92.5 kg 91.3 kg 90.6 kg   Examination:  General exam: Appears calm and comfortable, not in any acute distress. Respiratory system: CTA Bilaterally. Respiratory effort normal. RR 15 Cardiovascular system: S1 & S2 heard, RRR. No JVD, murmurs, rubs, gallops or clicks.  Gastrointestinal system: Abdomen is non distended, soft and non tender. Normal bowel sounds heard. Central nervous system: Alert and oriented x 3. No focal neurological deficits. Extremities: No edema, no cyanosis, no clubbing. Skin: No rashes, lesions or ulcers Psychiatry: Judgement and insight appear normal. Mood & affect appropriate.   Data Reviewed: I have personally reviewed following labs and imaging studies  CBC: Recent Labs  Lab 09/02/24 0500 09/03/24 0529 09/04/24 0510 09/05/24 0500 09/05/24 0841  WBC 7.2 7.5 7.8 7.6 8.2  HGB 7.1* 7.5* 7.8* 7.5* 7.6*  HCT 22.6* 23.9* 24.7* 23.6* 24.0*  MCV 94.6 94.8 95.0 95.2 93.4  PLT 147* 145* 153 118* 144*   Basic Metabolic Panel: Recent Labs  Lab 09/01/24 0558 09/02/24 0500 09/03/24 0529 09/04/24 0500 09/04/24 0510 09/05/24 0500 09/05/24 0841  NA 134* 132* 132*  133* 131* 132* 132* 132*  K 3.9 3.7 3.3*  3.3* 3.5 3.6 3.6 3.6  CL 97* 97* 96*  94* 96* 96* 96* 96*  CO2 25 20* 30  28 27 27 25 25   GLUCOSE 89 92 98  95 104* 102* 91 89  BUN 33* 38* 19  21 23 23  26* 27*  CREATININE 2.50* 2.48* 1.53*  1.48* 1.69* 1.68* 1.92* 1.95*  CALCIUM  9.5 9.0 9.1  8.7* 9.0 9.0 9.4 9.4  MG 1.8 1.8 1.7  --  1.8 2.0  --   PHOS 3.2 3.1 2.0* 2.1*  --  2.2* 2.2*   GFR: Estimated  Creatinine Clearance: 35 mL/min (A) (by C-G formula based on SCr of 1.95 mg/dL (H)). Liver Function Tests: Recent Labs  Lab 09/01/24 0558 09/02/24 0500 09/03/24 0529 09/04/24 0500 09/05/24 0841  ALBUMIN  3.2* 3.0* 3.1* 3.3* 3.3*   No results for input(s): LIPASE, AMYLASE in the last 168 hours. No results for input(s): AMMONIA in the last 168 hours. Coagulation Profile: No results for input(s): INR, PROTIME in the last 168 hours. Cardiac Enzymes: No results for input(s): CKTOTAL, CKMB, CKMBINDEX, TROPONINI in the last 168 hours. BNP (last 3 results) No results for input(s): PROBNP in the last 8760 hours. HbA1C: No results for input(s): HGBA1C in the last 72 hours. CBG: No results for input(s): GLUCAP in the last 168 hours. Lipid Profile: No results for input(s): CHOL, HDL, LDLCALC, TRIG, CHOLHDL, LDLDIRECT in the last 72 hours. Thyroid Function Tests: No results for input(s): TSH, T4TOTAL, FREET4, T3FREE, THYROIDAB in the last 72 hours. Anemia  Panel: No results for input(s): VITAMINB12, FOLATE, FERRITIN, TIBC, IRON , RETICCTPCT in the last 72 hours. Sepsis Labs: No results for input(s): PROCALCITON, LATICACIDVEN in the last 168 hours.  No results found for this or any previous visit (from the past 240 hours).   Radiology Studies: DG CHEST PORT 1 VIEW Result Date: 09/04/2024 EXAM: 1 VIEW(S) XRAY OF THE CHEST 09/04/2024 06:51:00 PM COMPARISON: 08/30/2024 CLINICAL HISTORY: PICC line infection, sequela. FINDINGS: LINES, TUBES AND DEVICES: Right PICC line tip at the cavoatrial junction. LUNGS AND PLEURA: Small right pleural effusion with right base atelectasis. No pneumothorax. HEART AND MEDIASTINUM: Left AICD remains in place, unchanged. Cardiomegaly. Vascular congestion. BONES AND SOFT TISSUES: No acute osseous abnormality. IMPRESSION: 1. Cardiomegaly with vascular congestion. 2. Small right pleural effusion with right  basilar atelectasis. 3. Right PICC line placement with tip at the cavoatrial junction. Electronically signed by: Franky Crease MD 09/04/2024 08:05 PM EST RP Workstation: [id number]   IR Removal Tun Cv Cath W/O FL Result Date: 09/04/2024 INDICATION: Patient with a history of acute kidney injury on chronic disease requiring short-term hemodialysis. Patient was seen in IR 08/28/2024 and received a tunneled hemodialysis catheter. Patient has experienced renal recovery and Interventional Radiology has been asked to remove tunneled dialysis catheter. EXAM: REMOVAL TUNNELED CENTRAL VENOUS CATHETER MEDICATIONS: None ANESTHESIA/SEDATION: None FLUOROSCOPY: None COMPLICATIONS: None immediate. PROCEDURE: Informed written consent was obtained from the patient after a thorough discussion of the procedural risks, benefits and alternatives. All questions were addressed. Maximal Sterile Barrier Technique was utilized including caps, mask, sterile gowns, sterile gloves, sterile drape, hand hygiene and skin antiseptic. A timeout was performed prior to the initiation of the procedure. The patient's right chest and catheter was prepped and draped in a normal sterile fashion. Heparin  was removed from both ports of catheter. 1% lidocaine  was used for local anesthesia. Using gentle manual traction the cuff of the catheter was exposed and the catheter was removed in it's entirety. Pressure was held till hemostasis was obtained. A sterile dressing was applied. The patient tolerated the procedure well with no immediate complications. IMPRESSION: Successful RIGHT chest hemodialysis catheter removal. Procedure performed by Warren Dais, NP under supervision of Thom Hall, MD Electronically Signed   By: Thom Hall M.D.   On: 09/04/2024 15:50   Scheduled Meds:  acetaminophen   650 mg Oral Q6H   amiodarone   200 mg Oral Daily   apixaban   5 mg Oral BID   atorvastatin   40 mg Oral Daily   Chlorhexidine  Gluconate Cloth  6 each Topical  Daily   Chlorhexidine  Gluconate Cloth  6 each Topical Q0600   Chlorhexidine  Gluconate Cloth  6 each Topical Q0600   collagenase    Topical Daily   darbepoetin (ARANESP ) injection - DIALYSIS  150 mcg Subcutaneous Q Tue-1800   hydrocerin   Topical BID   polyethylene glycol  17 g Oral Daily   sodium chloride  flush  10-40 mL Intracatheter Q12H   torsemide   80 mg Oral Daily   Continuous Infusions:  DOBUTamine  2.5 mcg/kg/min (09/05/24 1013)     LOS: 14 days    Time spent: 50 mins    Darcel Dawley, MD [facility]   If 7PM-7AM, please contact night-coverage

## 2024-09-05 NOTE — Progress Notes (Signed)
 [name] Progress Note   Assessment/ Plan:   # AKI on CKD IV (baseline 2.3-2.6)--> cardiorenal syndrome, decompensated CHF - TTE 04/2024 with EF 35-40% - cardiorenal seems to be more at play here - discussed with AHF- not uremic so if cardiac function can be optimized we may possibly get him off dialysis, especially if his Cr is steady - HD 12/9-> then will reassess needs with med modification - dobutamine  2.5--> strict I/O No need for further dialysis- will d/c TDC - on PO torsemide  now - Will sign off for now- please call with questions.  Will arrange followup with us  as well  #Acute on chronic CHF - milrinone  originally - switched to dobutamine  - Lasix  - HD 12/9   # Acute hypoxic respiratory failure  - on supplemental oxygen  - improving   # Hyperkalemia - resolved   # Anemia of CKD - getting 1000 mg IV iron  - increase aranesp  to 150 mcg q Tuesday   Subjective:    Seen in room.  Cr bumped slightly up to 1.9.  Weights are decreasing.  Doesn't look like a lot of UOP but wonder if some of it dumped   Objective:   BP (!) 124/52 (BP Location: Left Arm)   Pulse 84   Temp 97.9 F (36.6 C) (Oral)   Resp 19   Ht 5' 6 (1.676 m)   Wt 90.6 kg   SpO2 96%   BMI 32.24 kg/m   Intake/Output Summary (Last 24 hours) at 09/05/2024 1049 Last data filed at 09/05/2024 9349 Gross per 24 hour  Intake 1002.71 ml  Output 550 ml  Net 452.71 ml   Weight change:   Physical Exam: Gen:NAD CVS: RRR Resp: muffled bilateral bases Abd: distended Ext:in UNNA boots removed ACCESS: R internal jugular TDC removed  Imaging: DG CHEST PORT 1 VIEW Result Date: 09/04/2024 EXAM: 1 VIEW(S) XRAY OF THE CHEST 09/04/2024 06:51:00 PM COMPARISON: 08/30/2024 CLINICAL HISTORY: PICC line infection, sequela. FINDINGS: LINES, TUBES AND DEVICES: Right PICC line tip at the cavoatrial junction. LUNGS AND PLEURA: Small right pleural effusion with right base atelectasis. No pneumothorax.  HEART AND MEDIASTINUM: Left AICD remains in place, unchanged. Cardiomegaly. Vascular congestion. BONES AND SOFT TISSUES: No acute osseous abnormality. IMPRESSION: 1. Cardiomegaly with vascular congestion. 2. Small right pleural effusion with right basilar atelectasis. 3. Right PICC line placement with tip at the cavoatrial junction. Electronically signed by: Franky Crease MD 09/04/2024 08:05 PM EST RP Workstation: [id number]   IR Removal Tun Cv Cath W/O FL Result Date: 09/04/2024 INDICATION: Patient with a history of acute kidney injury on chronic disease requiring short-term hemodialysis. Patient was seen in IR 08/28/2024 and received a tunneled hemodialysis catheter. Patient has experienced renal recovery and Interventional Radiology has been asked to remove tunneled dialysis catheter. EXAM: REMOVAL TUNNELED CENTRAL VENOUS CATHETER MEDICATIONS: None ANESTHESIA/SEDATION: None FLUOROSCOPY: None COMPLICATIONS: None immediate. PROCEDURE: Informed written consent was obtained from the patient after a thorough discussion of the procedural risks, benefits and alternatives. All questions were addressed. Maximal Sterile Barrier Technique was utilized including caps, mask, sterile gowns, sterile gloves, sterile drape, hand hygiene and skin antiseptic. A timeout was performed prior to the initiation of the procedure. The patient's right chest and catheter was prepped and draped in a normal sterile fashion. Heparin  was removed from both ports of catheter. 1% lidocaine  was used for local anesthesia. Using gentle manual traction the cuff of the catheter was exposed and the catheter was removed in it's entirety. Pressure was  held till hemostasis was obtained. A sterile dressing was applied. The patient tolerated the procedure well with no immediate complications. IMPRESSION: Successful RIGHT chest hemodialysis catheter removal. Procedure performed by Warren Dais, NP under supervision of Thom Hall, MD Electronically  Signed   By: Thom Hall M.D.   On: 09/04/2024 15:50    Labs: BMET Recent Labs  Lab 08/30/24 0555 08/31/24 0830 09/01/24 0558 09/02/24 0500 09/03/24 0529 09/04/24 0500 09/04/24 0510 09/05/24 0500 09/05/24 0841  NA 135 135 134* 132* 132*  133* 131* 132* 132* 132*  K 3.7 3.9 3.9 3.7 3.3*  3.3* 3.5 3.6 3.6 3.6  CL 98 97* 97* 97* 96*  94* 96* 96* 96* 96*  CO2 25 26 25  20* 30  28 27 27 25 25   GLUCOSE 125* 86 89 92 98  95 104* 102* 91 89  BUN 41* 29* 33* 38* 19  21 23 23  26* 27*  CREATININE 2.54* 2.38* 2.50* 2.48* 1.53*  1.48* 1.69* 1.68* 1.92* 1.95*  CALCIUM  9.1 9.3 9.5 9.0 9.1  8.7* 9.0 9.0 9.4 9.4  PHOS 2.5 2.7 3.2 3.1 2.0* 2.1*  --   --  2.2*   CBC Recent Labs  Lab 09/02/24 0500 09/03/24 0529 09/04/24 0510 09/05/24 0500  WBC 7.2 7.5 7.8 7.6  HGB 7.1* 7.5* 7.8* 7.5*  HCT 22.6* 23.9* 24.7* 23.6*  MCV 94.6 94.8 95.0 95.2  PLT 147* 145* 153 118*    Medications:     acetaminophen   650 mg Oral Q6H   amiodarone   200 mg Oral Daily   atorvastatin   40 mg Oral Daily   Chlorhexidine  Gluconate Cloth  6 each Topical Daily   Chlorhexidine  Gluconate Cloth  6 each Topical Q0600   Chlorhexidine  Gluconate Cloth  6 each Topical Q0600   collagenase    Topical Daily   darbepoetin (ARANESP ) injection - DIALYSIS  150 mcg Subcutaneous Q Tue-1800   heparin  injection (subcutaneous)  5,000 Units Subcutaneous Q8H   hydrocerin   Topical BID   polyethylene glycol  17 g Oral Daily   sodium chloride  flush  10-40 mL Intracatheter Q12H   torsemide   80 mg Oral Daily    Almarie Bonine, MD 09/05/2024, 10:49 AM

## 2024-09-05 NOTE — Progress Notes (Signed)
°   09/05/24 1430  Vitals  Temp 97.8 F (36.6 C)  Temp Source Oral  BP (!) 141/51  MAP (mmHg) 76  BP Location Left Arm  BP Method Automatic  Patient Position (if appropriate) Lying  Pulse Rate Source Monitor  ECG Heart Rate 71  Resp 18  Level of Consciousness  Level of Consciousness Alert  MEWS COLOR  MEWS Score Color Green  Oxygen Therapy  SpO2 95 %  O2 Device Room Air   Patient returned to unit after procedure. A&O x4. Vitals as listed above. Patient reports no pain. New tunneled PICC single lumen in place. Dressing clean, dry, and intact. No signs of active bleeding at the moment.

## 2024-09-05 NOTE — Progress Notes (Signed)
 Physical Therapy Treatment Patient Details Name: Dean Bledsoe Sr. MRN: [id number] DOB: Jun 29, 1951 Today's Date: 09/05/2024   History of Present Illness 73 yo male adm 08/22/24 s/p fall down 8 stairs in wheelchair. CT (+) T6 vertebral body fx, non-op in TLSO. 11/28, 12/5 thoracentesis. 12/4 TDC placed and iHD 12/4-12/9. PMHx: AICD, arthritis, HLD, HFrEF, ICD, PAF on Eliquis , HTN, CAD, HLD, OSA on cpap, T2DM, CKD, HTN, laminectomy, cardiac arrest.    PT Comments  Pt pleasant, familiar from prior admission and needs cues and assist to don brace and adhere to precautions. Pt remains limited in activity tolerance due to fatigue with gait and transfers. Pt reports family will be able to assist at D/C and encouraged continued mobility acutely with reinforced education of brace wear OOB and back precautions.     If plan is discharge home, recommend the following: A little help with walking and/or transfers;A little help with bathing/dressing/bathroom;Assistance with cooking/housework;Help with stairs or ramp for entrance   Can travel by private vehicle        Equipment Recommendations  Rolling walker (2 wheels)    Recommendations for Other Services       Precautions / Restrictions Precautions Precautions: Fall;Back Precaution Booklet Issued: No Recall of Precautions/Restrictions: Impaired Precaution/Restrictions Comments: pt able to verbalize precautions but needs cues to maintain with movement and admittedly twisting in bed. Required Braces or Orthoses: Spinal Brace Spinal Brace: Thoracolumbosacral orthotic;Applied in sitting position     Mobility  Bed Mobility               General bed mobility comments: sitting EOB in hole on arrival without brace    Transfers Overall transfer level: Needs assistance     Sit to Stand: Supervision           General transfer comment: supervision for lines and safety to rise from bed and pt performed 10 repeated sit to stands from  chair end of session with reliance on UB support    Ambulation/Gait Ambulation/Gait assistance: Supervision Gait Distance (Feet): 150 Feet Assistive device: Rolling walker (2 wheels) Gait Pattern/deviations: Step-through pattern, Decreased stride length, Trunk flexed   Gait velocity interpretation: <1.8 ft/sec, indicate of risk for recurrent falls   General Gait Details: cues for proximity to RW and direction, limited by fatigue, HR 84   Stairs             Wheelchair Mobility     Tilt Bed    Modified Rankin (Stroke Patients Only)       Balance Overall balance assessment: Needs assistance Sitting-balance support: No upper extremity supported, Feet supported Sitting balance-Leahy Scale: Fair     Standing balance support: Bilateral upper extremity supported, Reliant on assistive device for balance, During functional activity Standing balance-Leahy Scale: Poor Standing balance comment: RW for gait                            Communication Communication Communication: No apparent difficulties  Cognition Arousal: Alert Behavior During Therapy: WFL for tasks assessed/performed   PT - Cognitive impairments: Memory, Problem solving, Safety/Judgement                       PT - Cognition Comments: needs mod cues and assist to don brace, decreased awareness of precautions   Following commands impaired: Follows one step commands with increased time    Cueing Cueing Techniques: Verbal cues  Exercises General Exercises - Lower Extremity Long  Arc Quad: AROM, Both, 10 reps, Seated, Strengthening    General Comments        Pertinent Vitals/Pain Pain Assessment Pain Score: 4  Pain Location: back Pain Descriptors / Indicators: Aching, Sore Pain Intervention(s): Limited activity within patient's tolerance, Monitored during session, Repositioned    Home Living                          Prior Function            PT Goals (current  goals can now be found in the care plan section) Progress towards PT goals: Progressing toward goals    Frequency    Min 2X/week      PT Plan      Co-evaluation              AM-PAC PT 6 Clicks Mobility   Outcome Measure  Help needed turning from your back to your side while in a flat bed without using bedrails?: A Little Help needed moving from lying on your back to sitting on the side of a flat bed without using bedrails?: A Little Help needed moving to and from a bed to a chair (including a wheelchair)?: A Little Help needed standing up from a chair using your arms (e.g., wheelchair or bedside chair)?: A Little Help needed to walk in hospital room?: A Little Help needed climbing 3-5 steps with a railing? : A Little 6 Click Score: 18    End of Session Equipment Utilized During Treatment: Back brace Activity Tolerance: Patient tolerated treatment well Patient left: in chair;with call bell/phone within reach;with chair alarm set Nurse Communication: Mobility status PT Visit Diagnosis: Other abnormalities of gait and mobility (R26.89);Muscle weakness (generalized) (M62.81)     Time: 9242-9177 PT Time Calculation (min) (ACUTE ONLY): 25 min  Charges:    $Gait Training: 8-22 mins $Therapeutic Activity: 8-22 mins PT General Charges $$ ACUTE PT VISIT: 1 Visit                     Lenoard SQUIBB, PT Acute Rehabilitation Services Office: [phone]    Lenoard KATHEE Docker 09/05/2024, 8:33 AM

## 2024-09-05 NOTE — Progress Notes (Signed)
° °  Patient Status: [facility] - In-pt  Assessment and Plan: Patient in need of venous access.   Needs SL tunneled central catheter for Home Inotrope per MD  ______________________________________________________________________   History of Present Illness: Dean Bannan Sr. is a 73 y.o. male   Clemens down stairs in wheelchair; T6 fx- in non OP TSLO Had  worsening renal function and TDC placed 12/4-12/9 removal in IR 12/11 Known CHF, HFrEF PAF- Eliquis  HTN; CAD; HLD; DM; CKD; Cardiac arrest  Follows with cardiology-- dobutamine  IV; Lasix  CardioRenal Syndrome Low output Biventricular heart failure Needs home inotrope Request for single lumen tunneled central catheter  Allergies and medications reviewed.   Review of Systems: A 12 point ROS discussed and pertinent positives are indicated in the HPI above.  All other systems are negative.  Vital Signs: BP (!) 124/52 (BP Location: Left Arm)   Pulse 84   Temp 97.9 F (36.6 C) (Oral)   Resp 19   Ht 5' 6 (1.676 m)   Wt 199 lb 11.8 oz (90.6 kg)   SpO2 96%   BMI 32.24 kg/m   Physical Exam Vitals reviewed.  Cardiovascular:     Rate and Rhythm: Normal rate.  Pulmonary:     Effort: Pulmonary effort is normal.  Skin:    General: Skin is warm and dry.  Neurological:     Mental Status: He is alert and oriented to person, place, and time.  Psychiatric:        Behavior: Behavior normal.      Imaging reviewed.   Labs:  COAGS: Recent Labs    11/17/23 1720 05/07/24 0554 08/22/24 0418  INR 1.8*  --  1.6*  APTT 41* 97*  --     BMP: Recent Labs    09/03/24 0529 09/04/24 0500 09/04/24 0510 09/05/24 0500  NA 132*  133* 131* 132* 132*  K 3.3*  3.3* 3.5 3.6 3.6  CL 96*  94* 96* 96* 96*  CO2 30  28 27 27 25   GLUCOSE 98  95 104* 102* 91  BUN 19  21 23 23  26*  CALCIUM  9.1  8.7* 9.0 9.0 9.4  CREATININE 1.53*  1.48* 1.69* 1.68* 1.92*  GFRNONAA 47*  49* 42* 42* 36*   Scheduled for tunneled central  catheter placement For Home Inotrope medication Pt is aware of procedure benefits and risks including but limited to: infection; bleeding; vessel damage. Agreeable to proceed Consent signed and in chart    Electronically Signed: Sharlet DELENA Candle, PA-C 09/05/2024, 9:52 AM   I spent a total of 15 minutes in face to face in clinical consultation, greater than 50% of which was counseling/coordinating care for venous access.

## 2024-09-05 NOTE — Progress Notes (Signed)
 Occupational Therapy Treatment Patient Details Name: Dean [name]. MRN: [id number] DOB: [birth year] Today's Date: 09/05/2024   History of present illness 73 yo male adm 08/22/24 s/p fall down 8 stairs in wheelchair. CT (+) T6 vertebral body fx, non-op in TLSO. 11/28, 12/5 thoracentesis. 12/4 TDC placed and iHD 12/4-12/9. PMHx: AICD, arthritis, HLD, HFrEF, ICD, PAF on Eliquis , HTN, CAD, HLD, OSA on cpap, T2DM, CKD, HTN, laminectomy, cardiac arrest.   OT comments  Pt. Seen for skilled OT treatment session.  Pt. With improvements with use of A/E for LB dressing. Able to use reacher for management of socks and shoes along with sock aide today.  Provided shoe horn also as the heels of his sneakers tend to bend down when sliding foot into the shoe.  Pt. Reports good support at home from both dtrs.  Able to recall 3/3 precautions but cont. To attempt to bend forward even while using A/E. Cues to correct.        If plan is discharge home, recommend the following:  A little help with walking and/or transfers;A lot of help with bathing/dressing/bathroom;Assistance with cooking/housework;Direct supervision/assist for medications management;Assist for transportation;Help with stairs or ramp for entrance   Equipment Recommendations       Recommendations for Other Services      Precautions / Restrictions Precautions Precautions: Fall;Back Recall of Precautions/Restrictions: Impaired Precaution/Restrictions Comments: pt able to verbalize precautions but needs cues to maintain with movement  cont. to attempt to bend forward Required Braces or Orthoses: Spinal Brace Spinal Brace: Thoracolumbosacral orthotic;Applied in sitting position       Mobility Bed Mobility               General bed mobility comments: seated in recliner at beg. and end of session    Transfers Overall transfer level: Needs assistance Equipment used: Rolling walker (2 wheels) Transfers: Sit to/from Stand, Bed to  chair/wheelchair/BSC Sit to Stand: Supervision           General transfer comment: stood and able to take forward steps and backward steps with S. good hand placement before sitting down     Balance                                           ADL either performed or assessed with clinical judgement   ADL Overall ADL's : Needs assistance/impaired                   Upper Body Dressing Details (indicate cue type and reason): reports it requires assistance for donning brace and he will have that assistance from both dtrs at home Lower Body Dressing: Minimal assistance;Cueing for sequencing;Cueing for back precautions;With adaptive equipment;Sitting/lateral leans Lower Body Dressing Details (indicate cue type and reason): don/doff socks and shoes with reacher and sock aide-improvement with use of the A/E from previous session             Functional mobility during ADLs: Supervision/safety;Rolling walker (2 wheels) General ADL Comments: able to demo good tech with reaching for arm rests to sit w/o cues required    Extremity/Trunk Assessment              Vision       Perception     Praxis     Communication Communication Communication: No apparent difficulties   Cognition Arousal: Alert Behavior During Therapy: [facility] for tasks assessed/performed Cognition:  No apparent impairments                               Following commands: Intact Following commands impaired: Follows one step commands with increased time      Cueing   Cueing Techniques: Verbal cues, Gestural cues  Exercises      Shoulder Instructions       General Comments  Pt. Asking about salt consumption and amount allowed daily.  Md present and was able to answer his questions regarding salt and salt substitutes along with how to modify recipes prn.  Pt. Motivated for diet changes and wants to understand what he needs to do stating he is not wanting long term  dialysis so he wants to fix the things he can      Pertinent Vitals/ Pain       Pain Assessment Pain Assessment: No/denies pain  Home Living                                          Prior Functioning/Environment              Frequency  Min 2X/week        Progress Toward Goals  OT Goals(current goals can now be found in the care plan section)  Progress towards OT goals: Progressing toward goals     Plan      Co-evaluation                 AM-PAC OT 6 Clicks Daily Activity     Outcome Measure   Help from another person eating meals?: None Help from another person taking care of personal grooming?: A Little Help from another person toileting, which includes using toliet, bedpan, or urinal?: A Little Help from another person bathing (including washing, rinsing, drying)?: A Lot Help from another person to put on and taking off regular upper body clothing?: A Little Help from another person to put on and taking off regular lower body clothing?: A Lot 6 Click Score: 17    End of Session Equipment Utilized During Treatment: Back brace;Rolling walker (2 wheels)  OT Visit Diagnosis: Unsteadiness on feet (R26.81);Other abnormalities of gait and mobility (R26.89);Muscle weakness (generalized) (M62.81);History of falling (Z91.81);Pain   Activity Tolerance Patient tolerated treatment well   Patient Left in chair;with call bell/phone within reach;with chair alarm set   Nurse Communication Other (comment) (rn present at end of session with meds)        Time: 9046-8977 OT Time Calculation (min): 29 min  Charges: OT General Charges $OT Visit: 1 Visit OT Treatments $Self Care/Home Management : 23-37 mins  Randall, COTA/L Acute Rehabilitation [phone]   CHRISTELLA Nest Lorraine-COTA/L  09/05/2024, 12:36 PM

## 2024-09-06 DIAGNOSIS — I130 Hypertensive heart and chronic kidney disease with heart failure and stage 1 through stage 4 chronic kidney disease, or unspecified chronic kidney disease: Secondary | ICD-10-CM | POA: Diagnosis not present

## 2024-09-06 DIAGNOSIS — I1310 Hypertensive heart and chronic kidney disease without heart failure, with stage 1 through stage 4 chronic kidney disease, or unspecified chronic kidney disease: Secondary | ICD-10-CM | POA: Diagnosis not present

## 2024-09-06 LAB — COOXEMETRY PANEL
Carboxyhemoglobin: 1.6 % — ABNORMAL HIGH (ref 0.5–1.5)
Methemoglobin: 1.1 % (ref 0.0–1.5)
O2 Saturation: 57.9 %
Total hemoglobin: 8.4 g/dL — ABNORMAL LOW (ref 12.0–16.0)

## 2024-09-06 LAB — CBC
HCT: 24.1 % — ABNORMAL LOW (ref 39.0–52.0)
Hemoglobin: 7.9 g/dL — ABNORMAL LOW (ref 13.0–17.0)
MCH: 30.5 pg (ref 26.0–34.0)
MCHC: 32.8 g/dL (ref 30.0–36.0)
MCV: 93.1 fL (ref 80.0–100.0)
Platelets: 118 K/uL — ABNORMAL LOW (ref 150–400)
RBC: 2.59 MIL/uL — ABNORMAL LOW (ref 4.22–5.81)
RDW: 17.8 % — ABNORMAL HIGH (ref 11.5–15.5)
WBC: 8.2 K/uL (ref 4.0–10.5)
nRBC: 0 % (ref 0.0–0.2)

## 2024-09-06 LAB — MAGNESIUM: Magnesium: 2 mg/dL (ref 1.7–2.4)

## 2024-09-06 LAB — BASIC METABOLIC PANEL WITH GFR
Anion gap: 6 (ref 5–15)
BUN: 30 mg/dL — ABNORMAL HIGH (ref 8–23)
CO2: 28 mmol/L (ref 22–32)
Calcium: 10 mg/dL (ref 8.9–10.3)
Chloride: 98 mmol/L (ref 98–111)
Creatinine, Ser: 2.3 mg/dL — ABNORMAL HIGH (ref 0.61–1.24)
GFR, Estimated: 29 mL/min — ABNORMAL LOW (ref >60)
Glucose, Bld: 101 mg/dL — ABNORMAL HIGH (ref 70–99)
Potassium: 4.4 mmol/L (ref 3.5–5.1)
Sodium: 132 mmol/L — ABNORMAL LOW (ref 135–145)

## 2024-09-06 NOTE — Progress Notes (Signed)
 PROGRESS NOTE    Dean Croft Sr.  FMW:[id number] DOB: August 13, 1951 DOA: 08/22/2024 PCP: [name], MD   Brief Narrative: This 73 yrs old male with PMH significant for CAD, PAF, ICM, cardiac arrest s/p AICD, HFrEF (EF 35-40% in 04/2024), hypertension, hyperlipidemia, type 2 diabetes, obstructive sleep apnea and recent admission on 07/16/24 for HFrEF exacerbation presented to hospital with back pain after falling from the wheelchair.  He was noted to have acute kidney injury and T6 fracture with lower extremity edema.  Patient's hospital stay was complicated with heart failure with worsening renal functions and cardiology and nephrology were consulted.  Patient had hemodialysis initiated for volume management and underwent thoracocentesis on 11-28 and 12-5 for worsening right pleural effusion.  Also required IV iron .     At this time, patient is being followed by cardiology and is on dobutamine  drip. He underwent hemodialysis for volume management. Nephro signed off , No more HD.  Assessment & Plan:   Principal Problem:   Multiple traumatic injuries Active Problems:   Cardiorenal syndrome with renal failure   Cardiorenal syndrome  Cardiorenal syndrome with renal failure: Low output biventricular heart failure: Cardiology following.  Last 2D echo showed  EF of 35 to 40%.  Cardiac catheterization 06/19/24 showed moderate pulmonary hypertension and low cardiac output.  Patient was initially on milrinone  drip with cardiology which was subsequently discontinued but at this time has been started on dobutamine . Continue strict intake and output charting. Denies any chest pain, ambulating much better with physical therapy. Continue dobutamine  infusion, Torsemide  80 mg daily. Status post tunneled cath placement for home inotropic therapy. TOC working on finding infusion provider.  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 [name] as outpatient.  Latest creatinine of 1.5 and trended up  today to 2.30 Patient briefly required hemo dialysis.  No need for further dialysis.  TDC catheter removed 12/11 Started on torsemide  80 mg daily..  Nephrology  signed off.   Acute on chronic anemia: Likely from CKD and iron  deficiency.  Received IV iron .  Latest hemoglobin of 7.9 from 7.1.  Status post Aranesp .   Acute T6 fracture, physical deconditioning: Neurosurgery was consulted.  Recommended TSLO brace and outpatient follow-up with neurosurgery Dr. [name].   Continue PT, OT while in the hospital.   R pleural effusion: RML/RLL consolidation Patient underwent IR guided thoracentesis on 11/28 with removal of 500 mL of bloody fluid.  Patient also had repeat thoracentesis on 12/5, with removal of 1.5L of bloody fluid.  Currently on room air and remained stable.   Hyperkalemia : Resolved and now with mild hypokalemia.  Replaced.  Continue to monitor.   Liver enhancement on imaging: Could be secondary to amiodarone .   PAF: Continue amiodarone  200mg  daily.   Patient was on Eliquis  5mg  BID (currently held) due to anemia. Eliquis  resumed    DVT prophylaxis: Heparin  Code Status: DNR Family Communication: No family at bed side. Disposition Plan:    Status is: Inpatient Remains inpatient appropriate because: Severity of illness.  Patient is not medically ready for discharge.  TOC is working on finding the infusion provider for home inotropic therapy.    Consultants:  Cardiology Nephrology  Procedures:  Antimicrobials: Anti-infectives (From admission, onward)    Start     Dose/Rate Route Frequency Ordered Stop   08/28/24 1345  ceFAZolin  (ANCEF ) IVPB 2g/100 mL premix        2 g 200 mL/hr over 30 Minutes Intravenous To Radiology 08/28/24 1247 08/28/24 1545  Subjective: Patient was seen and examined at bedside. Overnight events noted. Patient was sitting comfortably on the recliner, wearing TLSO brace,  He states he is feeling better. He remains on dobutamine   drip  Objective: Vitals:   09/06/24 0237 09/06/24 0454 09/06/24 0753 09/06/24 1138  BP: (!) 114/44  (!) 136/49 (!) 130/41  Pulse:   69 70  Resp: 17  16 17   Temp: 98.5 F (36.9 C)  97.9 F (36.6 C) (!) 97.4 F (36.3 C)  TempSrc: Oral  Oral Oral  SpO2: 100%  96% 98%  Weight:  90.6 kg    Height:        Intake/Output Summary (Last 24 hours) at 09/06/2024 1408 Last data filed at 09/06/2024 0241 Gross per 24 hour  Intake 10 ml  Output 500 ml  Net -490 ml   Filed Weights   09/03/24 0500 09/04/24 0500 09/06/24 0454  Weight: 91.3 kg 90.6 kg 90.6 kg   Examination:  General exam: Appears calm and comfortable, not in any acute distress. Respiratory system: CTA Bilaterally. Respiratory effort normal. RR 14 Cardiovascular system: S1 & S2 heard, RRR. No JVD, murmurs, rubs, gallops or clicks.  Gastrointestinal system: Abdomen is non distended, soft and non tender. Normal bowel sounds heard. Central nervous system: Alert and oriented x 3. No focal neurological deficits. Extremities: No edema, no cyanosis, no clubbing. Skin: No rashes, lesions or ulcers Psychiatry: Judgement and insight appear normal. Mood & affect appropriate.   Data Reviewed: I have personally reviewed following labs and imaging studies  CBC: Recent Labs  Lab 09/03/24 0529 09/04/24 0510 09/05/24 0500 09/05/24 0841 09/06/24 0526  WBC 7.5 7.8 7.6 8.2 8.2  HGB 7.5* 7.8* 7.5* 7.6* 7.9*  HCT 23.9* 24.7* 23.6* 24.0* 24.1*  MCV 94.8 95.0 95.2 93.4 93.1  PLT 145* 153 118* 144* 118*   Basic Metabolic Panel: Recent Labs  Lab 09/02/24 0500 09/03/24 0529 09/04/24 0500 09/04/24 0510 09/05/24 0500 09/05/24 0841 09/06/24 0526  NA 132* 132*  133* 131* 132* 132* 132* 132*  K 3.7 3.3*  3.3* 3.5 3.6 3.6 3.6 4.4  CL 97* 96*  94* 96* 96* 96* 96* 98  CO2 20* 30  28 27 27 25 25 28   GLUCOSE 92 98  95 104* 102* 91 89 101*  BUN 38* 19  21 23 23  26* 27* 30*  CREATININE 2.48* 1.53*  1.48* 1.69* 1.68* 1.92* 1.95*  2.30*  CALCIUM  9.0 9.1  8.7* 9.0 9.0 9.4 9.4 10.0  MG 1.8 1.7  --  1.8 2.0  --  2.0  PHOS 3.1 2.0* 2.1*  --  2.2* 2.2*  --    GFR: Estimated Creatinine Clearance: 29.7 mL/min (A) (by C-G formula based on SCr of 2.3 mg/dL (H)). Liver Function Tests: Recent Labs  Lab 09/01/24 0558 09/02/24 0500 09/03/24 0529 09/04/24 0500 09/05/24 0841  ALBUMIN  3.2* 3.0* 3.1* 3.3* 3.3*   No results for input(s): LIPASE, AMYLASE in the last 168 hours. No results for input(s): AMMONIA in the last 168 hours. Coagulation Profile: No results for input(s): INR, PROTIME in the last 168 hours. Cardiac Enzymes: No results for input(s): CKTOTAL, CKMB, CKMBINDEX, TROPONINI in the last 168 hours. BNP (last 3 results) No results for input(s): PROBNP in the last 8760 hours. HbA1C: No results for input(s): HGBA1C in the last 72 hours. CBG: No results for input(s): GLUCAP in the last 168 hours. Lipid Profile: No results for input(s): CHOL, HDL, LDLCALC, TRIG, CHOLHDL, LDLDIRECT in the last 72 hours. Thyroid  Function Tests: No results for input(s): TSH, T4TOTAL, FREET4, T3FREE, THYROIDAB in the last 72 hours. Anemia Panel: No results for input(s): VITAMINB12, FOLATE, FERRITIN, TIBC, IRON , RETICCTPCT in the last 72 hours. Sepsis Labs: No results for input(s): PROCALCITON, LATICACIDVEN in the last 168 hours.  No results found for this or any previous visit (from the past 240 hours).   Radiology Studies: IR TUNNELED CENTRAL VENOUS CATH [facility] W IMG Result Date: 09/05/2024 INDICATION: 73 year old male with chronic kidney disease in need of durable venous access. He presents for tunneled central venous catheter placement. EXAM: IR TUNNELED CENTRAL VENOUS CATH [facility] W IMG MEDICATIONS: None ANESTHESIA/SEDATION: None FLUOROSCOPY TIME:  Radiation exposure index: 10.9 mGy, air kerma COMPLICATIONS: None immediate. PROCEDURE: Informed written consent was obtained  from the patient after a thorough discussion of the procedural risks, benefits and alternatives. All questions were addressed. Maximal Sterile Barrier Technique was utilized including caps, mask, sterile gowns, sterile gloves, sterile drape, hand hygiene and skin antiseptic. A timeout was performed prior to the initiation of the procedure. The right internal jugular vein was interrogated with ultrasound and found to be patent. A small amount of nonocclusive thrombus is present along the vessel wall likely representing chronic thrombus or residual fibrin sheath from a prior venous access. An image was obtained and stored for the medical record. Local anesthesia was attained by infiltration with 1% lidocaine . A small dermatotomy was made. Under real-time sonographic guidance, the vessel was punctured with a 21 gauge micropuncture needle. Using standard technique, the initial micro needle was exchanged over a 0.018 micro wire for a peel-away sheath which was advanced into the right atrium. A suitable skin exit site was selected on the anterior chest wall. Local anesthesia was attained by infiltration with 1% lidocaine . A small dermatotomy was made. A tunneled single lumen central venous catheter was then tunneled from the skin exit site to the dermatotomy overlying the venous access site. The catheter was cut to length (24 cm) and advanced through the peel-away sheath. The peel-away sheath was removed. An image was obtained demonstrating the tip of the catheter at the cavoatrial junction. The catheter flushes and aspirates easily. The catheter was capped, flushed and secured to the skin with 0 Prolene suture. Sterile bandages were applied. IMPRESSION: Successful placement of a single-lumen power injectable tunneled central venous catheter via the right internal jugular vein. Catheter tip is at the cavoatrial junction and ready for immediate use. Nonocclusive thrombus present within the right internal jugular vein  likely from a prior central venous access catheter. Electronically Signed   By: Wilkie Lent M.D.   On: 09/05/2024 14:33   DG CHEST PORT 1 VIEW Result Date: 09/04/2024 EXAM: 1 VIEW(S) XRAY OF THE CHEST 09/04/2024 06:51:00 PM COMPARISON: 08/30/2024 CLINICAL HISTORY: PICC line infection, sequela. FINDINGS: LINES, TUBES AND DEVICES: Right PICC line tip at the cavoatrial junction. LUNGS AND PLEURA: Small right pleural effusion with right base atelectasis. No pneumothorax. HEART AND MEDIASTINUM: Left AICD remains in place, unchanged. Cardiomegaly. Vascular congestion. BONES AND SOFT TISSUES: No acute osseous abnormality. IMPRESSION: 1. Cardiomegaly with vascular congestion. 2. Small right pleural effusion with right basilar atelectasis. 3. Right PICC line placement with tip at the cavoatrial junction. Electronically signed by: Franky Crease MD 09/04/2024 08:05 PM EST RP Workstation: [id number]   Scheduled Meds:  acetaminophen   650 mg Oral Q6H   amiodarone   200 mg Oral Daily   apixaban   5 mg Oral BID   atorvastatin   40 mg Oral Daily   Chlorhexidine   Gluconate Cloth  6 each Topical Daily   Chlorhexidine  Gluconate Cloth  6 each Topical Q0600   Chlorhexidine  Gluconate Cloth  6 each Topical Q0600   collagenase    Topical Daily   darbepoetin (ARANESP ) injection - DIALYSIS  150 mcg Subcutaneous Q Tue-1800   hydrocerin   Topical BID   polyethylene glycol  17 g Oral Daily   sodium chloride  flush  10-40 mL Intracatheter Q12H   torsemide   80 mg Oral Daily   Continuous Infusions:  DOBUTamine  2.5 mcg/kg/min (09/05/24 1013)     LOS: 15 days    Time spent: 50 mins    Darcel Dawley, MD [facility]   If 7PM-7AM, please contact night-coverage

## 2024-09-06 NOTE — Plan of Care (Signed)
 Problem: Education: Goal: Knowledge of General Education information will improve Description: Including pain rating scale, medication(s)/side effects and non-pharmacologic comfort measures 09/06/2024 0028 by Marvis Kenneth SAILOR, RN Outcome: Progressing 09/05/2024 2046 by Marvis Kenneth SAILOR, RN Outcome: Progressing   Problem: Health Behavior/Discharge Planning: Goal: Ability to manage health-related needs will improve 09/06/2024 0028 by Marvis Kenneth SAILOR, RN Outcome: Progressing 09/05/2024 2046 by Marvis Kenneth SAILOR, RN Outcome: Progressing   Problem: Clinical Measurements: Goal: Ability to maintain clinical measurements within normal limits will improve 09/06/2024 0028 by Marvis Kenneth SAILOR, RN Outcome: Progressing 09/05/2024 2046 by Marvis Kenneth SAILOR, RN Outcome: Progressing Goal: Will remain free from infection 09/06/2024 0028 by Marvis Kenneth SAILOR, RN Outcome: Progressing 09/05/2024 2046 by Marvis Kenneth SAILOR, RN Outcome: Progressing Goal: Diagnostic test results will improve 09/06/2024 0028 by Marvis Kenneth SAILOR, RN Outcome: Progressing 09/05/2024 2046 by Marvis Kenneth SAILOR, RN Outcome: Progressing Goal: Respiratory complications will improve 09/06/2024 0028 by Marvis Kenneth SAILOR, RN Outcome: Progressing 09/05/2024 2046 by Marvis Kenneth SAILOR, RN Outcome: Progressing Goal: Cardiovascular complication will be avoided 09/06/2024 0028 by Marvis Kenneth SAILOR, RN Outcome: Progressing 09/05/2024 2046 by Marvis Kenneth SAILOR, RN Outcome: Progressing   Problem: Activity: Goal: Risk for activity intolerance will decrease 09/06/2024 0028 by Marvis Kenneth SAILOR, RN Outcome: Progressing 09/05/2024 2046 by Marvis Kenneth SAILOR, RN Outcome: Progressing   Problem: Nutrition: Goal: Adequate nutrition will be maintained 09/06/2024 0028 by Marvis Kenneth SAILOR, RN Outcome: Progressing 09/05/2024 2046 by Marvis Kenneth SAILOR, RN Outcome: Progressing   Problem: Coping: Goal: Level  of anxiety will decrease 09/06/2024 0028 by Marvis Kenneth SAILOR, RN Outcome: Progressing 09/05/2024 2046 by Marvis Kenneth SAILOR, RN Outcome: Progressing   Problem: Elimination: Goal: Will not experience complications related to bowel motility 09/06/2024 0028 by Marvis Kenneth SAILOR, RN Outcome: Progressing 09/05/2024 2046 by Marvis Kenneth SAILOR, RN Outcome: Progressing Goal: Will not experience complications related to urinary retention 09/06/2024 0028 by Marvis Kenneth SAILOR, RN Outcome: Progressing 09/05/2024 2046 by Marvis Kenneth SAILOR, RN Outcome: Progressing   Problem: Pain Managment: Goal: General experience of comfort will improve and/or be controlled 09/06/2024 0028 by Marvis Kenneth SAILOR, RN Outcome: Progressing 09/05/2024 2046 by Marvis Kenneth SAILOR, RN Outcome: Progressing   Problem: Safety: Goal: Ability to remain free from injury will improve 09/06/2024 0028 by Marvis Kenneth SAILOR, RN Outcome: Progressing 09/05/2024 2046 by Marvis Kenneth SAILOR, RN Outcome: Progressing   Problem: Skin Integrity: Goal: Risk for impaired skin integrity will decrease 09/06/2024 0028 by Marvis Kenneth SAILOR, RN Outcome: Progressing 09/05/2024 2046 by Marvis Kenneth SAILOR, RN Outcome: Progressing   Problem: Education: Goal: Knowledge of disease and its progression will improve 09/06/2024 0028 by Marvis Kenneth SAILOR, RN Outcome: Progressing 09/05/2024 2046 by Marvis Kenneth SAILOR, RN Outcome: Progressing   Problem: Fluid Volume: Goal: Compliance with measures to maintain balanced fluid volume will improve 09/06/2024 0028 by Marvis Kenneth SAILOR, RN Outcome: Progressing 09/05/2024 2046 by Marvis Kenneth SAILOR, RN Outcome: Progressing   Problem: Health Behavior/Discharge Planning: Goal: Ability to manage health-related needs will improve 09/06/2024 0028 by Marvis Kenneth SAILOR, RN Outcome: Progressing 09/05/2024 2046 by Marvis Kenneth SAILOR, RN Outcome: Progressing   Problem: Clinical  Measurements: Goal: Complications related to the disease process, condition or treatment will be avoided or minimized 09/06/2024 0028 by Marvis Kenneth SAILOR, RN Outcome: Progressing 09/05/2024 2046 by Marvis Kenneth SAILOR, RN Outcome: Progressing   Problem: Education: Goal: Knowledge of disease and its progression will improve 09/06/2024 0028 by Marvis Kenneth SAILOR, RN Outcome: Progressing 09/05/2024 2046 by  Marvis Kenneth SAILOR, RN Outcome: Progressing   Problem: Health Behavior/Discharge Planning: Goal: Ability to manage health-related needs will improve 09/06/2024 0028 by Marvis Kenneth SAILOR, RN Outcome: Progressing 09/05/2024 2046 by Marvis Kenneth SAILOR, RN Outcome: Progressing   Problem: Clinical Measurements: Goal: Complications related to the disease process or treatment will be avoided or minimized 09/06/2024 0028 by Marvis Kenneth SAILOR, RN Outcome: Progressing 09/05/2024 2046 by Marvis Kenneth SAILOR, RN Outcome: Progressing Goal: Dialysis access will remain free of complications 09/06/2024 0028 by Marvis Kenneth SAILOR, RN Outcome: Progressing 09/05/2024 2046 by Marvis Kenneth SAILOR, RN Outcome: Progressing   Problem: Activity: Goal: Activity intolerance will improve 09/06/2024 0028 by Marvis Kenneth SAILOR, RN Outcome: Progressing 09/05/2024 2046 by Marvis Kenneth SAILOR, RN Outcome: Progressing   Problem: Fluid Volume: Goal: Fluid volume balance will be maintained or improved 09/06/2024 0028 by Marvis Kenneth SAILOR, RN Outcome: Progressing 09/05/2024 2046 by Marvis Kenneth SAILOR, RN Outcome: Progressing   Problem: Nutritional: Goal: Ability to make appropriate dietary choices will improve 09/06/2024 0028 by Marvis Kenneth SAILOR, RN Outcome: Progressing 09/05/2024 2046 by Marvis Kenneth SAILOR, RN Outcome: Progressing   Problem: Respiratory: Goal: Respiratory symptoms related to disease process will be avoided 09/06/2024 0028 by Marvis Kenneth SAILOR, RN Outcome:  Progressing 09/05/2024 2046 by Marvis Kenneth SAILOR, RN Outcome: Progressing   Problem: Self-Concept: Goal: Body image disturbance will be avoided or minimized 09/06/2024 0028 by Marvis Kenneth SAILOR, RN Outcome: Progressing 09/05/2024 2046 by Marvis Kenneth SAILOR, RN Outcome: Progressing   Problem: Urinary Elimination: Goal: Progression of disease will be identified and treated 09/06/2024 0028 by Marvis Kenneth SAILOR, RN Outcome: Progressing 09/05/2024 2046 by Marvis Kenneth SAILOR, RN Outcome: Progressing

## 2024-09-06 NOTE — Progress Notes (Signed)
 DAILY PROGRESS NOTE   Patient Name: Dean Kronberg Sr. Date of Encounter: 09/06/2024 Cardiologist: Vinie JAYSON Maxcy, MD  Chief Complaint   No complaints  Patient Profile   Dean Kampe Sr. is a 73 y.o. 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 Juvenal.   Subjective   Appreciate care of HF and nephrology as well as hospital medicine. Mr. Foisy appears to be off dialysis with at least short term renal recovery since I saw him last 2 weeks ago.  Creatinine was stable at 1.9, but up to 2.3 today. Nephrology following. CO-ox 57.9 today (was 65). Had tunneled cath placed for home inotropes yesterday. On low dose dobutamine  and torsemide . Remained net negative overnight.  Objective   Vitals:   09/05/24 2322 09/06/24 0237 09/06/24 0454 09/06/24 0753  BP: (!) 128/43 (!) 114/44  (!) 136/49  Pulse: 70   69  Resp: 20 17  16   Temp: 98.3 F (36.8 C) 98.5 F (36.9 C)  97.9 F (36.6 C)  TempSrc: Oral Oral  Oral  SpO2: 98% 100%  96%  Weight:   90.6 kg   Height:        Intake/Output Summary (Last 24 hours) at 09/06/2024 1126 Last data filed at 09/06/2024 0241 Gross per 24 hour  Intake 10 ml  Output 500 ml  Net -490 ml   Filed Weights   09/03/24 0500 09/04/24 0500 09/06/24 0454  Weight: 91.3 kg 90.6 kg 90.6 kg    Physical Exam   General appearance: alert and no distress Neck: no carotid bruit, no JVD, and thyroid not enlarged, symmetric, no tenderness/mass/nodules Lungs: diminished breath sounds RLL Heart: regular rate and rhythm Abdomen: soft, non-tender; bowel sounds normal; no masses,  no organomegaly and in a back brace Extremities: edema 3+ bilateral pitting edema Neurologic: Grossly normal  Inpatient Medications    Scheduled Meds:  acetaminophen   650  mg Oral Q6H   amiodarone   200 mg Oral Daily   apixaban   5 mg Oral BID   atorvastatin   40 mg Oral Daily   Chlorhexidine  Gluconate Cloth  6 each Topical Daily   Chlorhexidine  Gluconate Cloth  6 each Topical Q0600   Chlorhexidine  Gluconate Cloth  6 each Topical Q0600   collagenase    Topical Daily   darbepoetin (ARANESP ) injection - DIALYSIS  150 mcg Subcutaneous Q Tue-1800   hydrocerin   Topical BID   polyethylene glycol  17 g Oral Daily   sodium chloride  flush  10-40 mL Intracatheter Q12H   torsemide   80 mg Oral Daily    Continuous Infusions:  DOBUTamine  2.5 mcg/kg/min (09/05/24 1013)    PRN Meds: HYDROmorphone  (DILAUDID ) injection, methocarbamol , mouth rinse, oxyCODONE , sodium chloride  flush   Labs   Results for orders placed or performed during the hospital encounter of 08/22/24 (from the past 48 hours)  Basic metabolic panel     Status: Abnormal   Collection Time: 09/05/24  5:00 AM  Result Value Ref Range   Sodium 132 (L) 135 - 145 mmol/L   Potassium 3.6 3.5 - 5.1 mmol/L   Chloride 96 (L) 98 - 111 mmol/L   CO2 25 22 - 32 mmol/L   Glucose, Bld 91 70 - 99 mg/dL    Comment: Glucose reference range applies only to samples taken after fasting for  at least 8 hours.   BUN 26 (H) 8 - 23 mg/dL   Creatinine, Ser 8.07 (H) 0.61 - 1.24 mg/dL   Calcium  9.4 8.9 - 10.3 mg/dL   GFR, Estimated 36 (L) >60 mL/min    Comment: (NOTE) Calculated using the CKD-EPI Creatinine Equation (2021)    Anion gap 11 5 - 15    Comment: Performed at [facility], [address], [place], [place] [address]  Magnesium      Status: None   Collection Time: 09/05/24  5:00 AM  Result Value Ref Range   Magnesium  2.0 1.7 - 2.4 mg/dL    Comment: Performed at [facility], [address] [place], [place] [address]  CBC     Status: Abnormal   Collection Time: 09/05/24  5:00 AM  Result Value Ref Range   WBC 7.6 4.0 - 10.5 K/uL   RBC 2.48 (L) 4.22 - 5.81 MIL/uL   Hemoglobin 7.5 (L) 13.0 - 17.0 g/dL    HCT 76.3 (L) 60.9 - 52.0 %   MCV 95.2 80.0 - 100.0 fL   MCH 30.2 26.0 - 34.0 pg   MCHC 31.8 30.0 - 36.0 g/dL   RDW 82.3 (H) 88.4 - 84.4 %   Platelets 118 (L) 150 - 400 K/uL   nRBC 0.0 0.0 - 0.2 %    Comment: Performed at [facility], [address] [place], [place] [address]  Phosphorus     Status: Abnormal   Collection Time: 09/05/24  5:00 AM  Result Value Ref Range   Phosphorus 2.2 (L) 2.5 - 4.6 mg/dL    Comment: Performed at [facility], [address] [place], [place] [address]  Renal function panel     Status: Abnormal   Collection Time: 09/05/24  8:41 AM  Result Value Ref Range   Sodium 132 (L) 135 - 145 mmol/L   Potassium 3.6 3.5 - 5.1 mmol/L   Chloride 96 (L) 98 - 111 mmol/L   CO2 25 22 - 32 mmol/L   Glucose, Bld 89 70 - 99 mg/dL    Comment: Glucose reference range applies only to samples taken after fasting for at least 8 hours.   BUN 27 (H) 8 - 23 mg/dL   Creatinine, Ser 8.04 (H) 0.61 - 1.24 mg/dL   Calcium  9.4 8.9 - 10.3 mg/dL   Phosphorus 2.2 (L) 2.5 - 4.6 mg/dL   Albumin  3.3 (L) 3.5 - 5.0 g/dL   GFR, Estimated 35 (L) >60 mL/min    Comment: (NOTE) Calculated using the CKD-EPI Creatinine Equation (2021)    Anion gap 11 5 - 15    Comment: Performed at [facility], [address] [place], [place] [address]  CBC     Status: Abnormal   Collection Time: 09/05/24  8:41 AM  Result Value Ref Range   WBC 8.2 4.0 - 10.5 K/uL   RBC 2.57 (L) 4.22 - 5.81 MIL/uL   Hemoglobin 7.6 (L) 13.0 - 17.0 g/dL   HCT 75.9 (L) 60.9 - 47.9 %   MCV 93.4 80.0 - 100.0 fL   MCH 29.6 26.0 - 34.0 pg   MCHC 31.7 30.0 - 36.0 g/dL   RDW 82.5 (H) 88.4 - 84.4 %   Platelets 144 (L) 150 - 400 K/uL   nRBC 0.0 0.0 - 0.2 %    Comment: Performed at [facility], [address] [place], [place] [address]  Cooxemetry Panel (carboxy, met, total hgb,  O2 sat)     Status: Abnormal   Collection Time: 09/05/24 11:27 AM  Result Value Ref Range   Total hemoglobin 8.1 (L) 12.0  - 16.0 g/dL   O2 Saturation 34.8 %   Carboxyhemoglobin 2.4 (H) 0.5 - 1.5 %   Methemoglobin <0.7 0.0 - 1.5 %    Comment: Performed at [facility], [address] [place], [place] [address]  Basic metabolic panel     Status: Abnormal   Collection Time: 09/06/24  5:26 AM  Result Value Ref Range   Sodium 132 (L) 135 - 145 mmol/L   Potassium 4.4 3.5 - 5.1 mmol/L   Chloride 98 98 - 111 mmol/L   CO2 28 22 - 32 mmol/L   Glucose, Bld 101 (H) 70 - 99 mg/dL    Comment: Glucose reference range applies only to samples taken after fasting for at least 8 hours.   BUN 30 (H) 8 - 23 mg/dL   Creatinine, Ser 7.69 (H) 0.61 - 1.24 mg/dL   Calcium  10.0 8.9 - 10.3 mg/dL   GFR, Estimated 29 (L) >60 mL/min    Comment: (NOTE) Calculated using the CKD-EPI Creatinine Equation (2021)    Anion gap 6 5 - 15    Comment: Performed at [facility], [address], [place], [place] [address]  Magnesium      Status: None   Collection Time: 09/06/24  5:26 AM  Result Value Ref Range   Magnesium  2.0 1.7 - 2.4 mg/dL    Comment: Performed at [facility], [address] [place], [place] [address]  Cooxemetry Panel (carboxy, met, total hgb, O2 sat)     Status: Abnormal   Collection Time: 09/06/24  5:26 AM  Result Value Ref Range   Total hemoglobin 8.4 (L) 12.0 - 16.0 g/dL   O2 Saturation 42.0 %   Carboxyhemoglobin 1.6 (H) 0.5 - 1.5 %   Methemoglobin 1.1 0.0 - 1.5 %    Comment: Performed at [facility], [address], [place], [place] [address]  CBC     Status: Abnormal   Collection Time: 09/06/24  5:26 AM  Result Value Ref Range   WBC 8.2 4.0 - 10.5 K/uL   RBC 2.59 (L) 4.22 - 5.81 MIL/uL   Hemoglobin 7.9 (L) 13.0 - 17.0 g/dL   HCT 75.8 (L) 60.9 - 47.9 %   MCV 93.1 80.0 - 100.0 fL   MCH 30.5 26.0 - 34.0 pg   MCHC 32.8 30.0 - 36.0 g/dL   RDW 82.1 (H) 88.4 - 84.4 %   Platelets 118 (L) 150 - 400 K/uL   nRBC 0.0 0.0 - 0.2 %    Comment: Performed at [facility], [address], [place], [place] [address]    ECG   Paced rhythm - Personally Reviewed  Telemetry   Paced rhythm - Personally Reviewed  Radiology    IR TUNNELED CENTRAL VENOUS CATH [facility] W IMG Result Date: 09/05/2024 INDICATION: 73 year old male with chronic kidney disease in need of durable venous access. He presents for tunneled central venous catheter placement. EXAM: IR TUNNELED CENTRAL VENOUS CATH PLC W IMG MEDICATIONS: None ANESTHESIA/SEDATION: None FLUOROSCOPY TIME:  Radiation exposure index: 10.9 mGy, air kerma COMPLICATIONS: None immediate. PROCEDURE: Informed written consent was obtained from the patient after a thorough discussion of the procedural risks, benefits and alternatives. All questions were addressed. Maximal Sterile Barrier Technique was utilized including caps, mask, sterile gowns, sterile gloves, sterile drape, hand  hygiene and skin antiseptic. A timeout was performed prior to the initiation of the procedure. The right internal jugular vein was interrogated with ultrasound and found to be patent. A small amount of nonocclusive thrombus is present along the vessel wall likely representing chronic thrombus or residual fibrin sheath from a prior venous access. An image was obtained and stored for the medical record. Local anesthesia was attained by infiltration with 1% lidocaine . A small dermatotomy was made. Under real-time sonographic guidance, the vessel was punctured with a 21 gauge micropuncture needle. Using standard technique, the initial micro needle was exchanged over a 0.018 micro wire for a peel-away sheath which was advanced into the right atrium. A suitable skin exit site was selected on the anterior chest wall. Local anesthesia was attained by infiltration with 1% lidocaine . A small dermatotomy was made. A tunneled single lumen central venous catheter was then tunneled from the skin exit site to the dermatotomy overlying the venous access site. The catheter was cut to length (24 cm)  and advanced through the peel-away sheath. The peel-away sheath was removed. An image was obtained demonstrating the tip of the catheter at the cavoatrial junction. The catheter flushes and aspirates easily. The catheter was capped, flushed and secured to the skin with 0 Prolene suture. Sterile bandages were applied. IMPRESSION: Successful placement of a single-lumen power injectable tunneled central venous catheter via the right internal jugular vein. Catheter tip is at the cavoatrial junction and ready for immediate use. Nonocclusive thrombus present within the right internal jugular vein likely from a prior central venous access catheter. Electronically Signed   By: Wilkie Lent M.D.   On: 09/05/2024 14:33   DG CHEST PORT 1 VIEW Result Date: 09/04/2024 EXAM: 1 VIEW(S) XRAY OF THE CHEST 09/04/2024 06:51:00 PM COMPARISON: 08/30/2024 CLINICAL HISTORY: PICC line infection, sequela. FINDINGS: LINES, TUBES AND DEVICES: Right PICC line tip at the cavoatrial junction. LUNGS AND PLEURA: Small right pleural effusion with right base atelectasis. No pneumothorax. HEART AND MEDIASTINUM: Left AICD remains in place, unchanged. Cardiomegaly. Vascular congestion. BONES AND SOFT TISSUES: No acute osseous abnormality. IMPRESSION: 1. Cardiomegaly with vascular congestion. 2. Small right pleural effusion with right basilar atelectasis. 3. Right PICC line placement with tip at the cavoatrial junction. Electronically signed by: Franky Crease MD 09/04/2024 08:05 PM EST RP Workstation: [id number]   IR Removal Tun Cv Cath W/O FL Result Date: 09/04/2024 INDICATION: Patient with a history of acute kidney injury on chronic disease requiring short-term hemodialysis. Patient was seen in IR 08/28/2024 and received a tunneled hemodialysis catheter. Patient has experienced renal recovery and Interventional Radiology has been asked to remove tunneled dialysis catheter. EXAM: REMOVAL TUNNELED CENTRAL VENOUS CATHETER MEDICATIONS: None  ANESTHESIA/SEDATION: None FLUOROSCOPY: None COMPLICATIONS: None immediate. PROCEDURE: Informed written consent was obtained from the patient after a thorough discussion of the procedural risks, benefits and alternatives. All questions were addressed. Maximal Sterile Barrier Technique was utilized including caps, mask, sterile gowns, sterile gloves, sterile drape, hand hygiene and skin antiseptic. A timeout was performed prior to the initiation of the procedure. The patient's right chest and catheter was prepped and draped in a normal sterile fashion. Heparin  was removed from both ports of catheter. 1% lidocaine  was used for local anesthesia. Using gentle manual traction the cuff of the catheter was exposed and the catheter was removed in it's entirety. Pressure was held till hemostasis was obtained. A sterile dressing was applied. The patient tolerated the procedure well with no immediate complications. IMPRESSION: Successful RIGHT chest hemodialysis  catheter removal. Procedure performed by Warren Dais, NP under supervision of Thom Hall, MD Electronically Signed   By: Thom Hall M.D.   On: 09/04/2024 15:50    Cardiac Studies   N/A  Assessment   Principal Problem:   Multiple traumatic injuries Active Problems:   Cardiorenal syndrome with renal failure   Cardiorenal syndrome Low output biventricular heart failure  Plan   Co-ox up and down -a little lower today, remains on 2.5 dobutamine . Tunneled cath in. Creatinine up - net negative overnight. Appreciate renal recs - ?increase in dobutamine  if co-ox continues to trend down. Diuretic dose appears appropriate.  TIME SPENT WITH PATIENT: 35 minutes of direct patient care. More than 50% of that time was spent on coordination of care and counseling regarding biventricular congestive heart failure and end-stage renal failure.  Length of Stay:  LOS: 15 days   Vinie KYM Maxcy, MD, [facility], FNLA, FACP  [place]  [name]  Medical  Director of the Advanced Lipid Disorders &  Cardiovascular Risk Reduction Clinic Diplomate of the American Board of Clinical Lipidology Attending Cardiologist  Direct Dial: [phone]  Fax: [phone]  Website:  [email or web address] Vinie BROCKS Troye Hiemstra 09/06/2024, 11:26 AM

## 2024-09-06 NOTE — Plan of Care (Signed)
°  Problem: Education: Goal: Knowledge of General Education information will improve Description: Including pain rating scale, medication(s)/side effects and non-pharmacologic comfort measures Outcome: Progressing   Problem: Clinical Measurements: Goal: Ability to maintain clinical measurements within normal limits will improve Outcome: Progressing   Problem: Activity: Goal: Risk for activity intolerance will decrease Outcome: Progressing   Problem: Nutrition: Goal: Adequate nutrition will be maintained Outcome: Progressing   Problem: Pain Managment: Goal: General experience of comfort will improve and/or be controlled Outcome: Progressing   Problem: Safety: Goal: Ability to remain free from injury will improve Outcome: Progressing   Problem: Fluid Volume: Goal: Compliance with measures to maintain balanced fluid volume will improve Outcome: Progressing

## 2024-09-07 DIAGNOSIS — I130 Hypertensive heart and chronic kidney disease with heart failure and stage 1 through stage 4 chronic kidney disease, or unspecified chronic kidney disease: Secondary | ICD-10-CM | POA: Diagnosis not present

## 2024-09-07 DIAGNOSIS — I1310 Hypertensive heart and chronic kidney disease without heart failure, with stage 1 through stage 4 chronic kidney disease, or unspecified chronic kidney disease: Secondary | ICD-10-CM | POA: Diagnosis not present

## 2024-09-07 LAB — BASIC METABOLIC PANEL WITH GFR
Anion gap: 9 (ref 5–15)
BUN: 35 mg/dL — ABNORMAL HIGH (ref 8–23)
CO2: 26 mmol/L (ref 22–32)
Calcium: 9.4 mg/dL (ref 8.9–10.3)
Chloride: 97 mmol/L — ABNORMAL LOW (ref 98–111)
Creatinine, Ser: 2.47 mg/dL — ABNORMAL HIGH (ref 0.61–1.24)
GFR, Estimated: 27 mL/min — ABNORMAL LOW (ref >60)
Glucose, Bld: 115 mg/dL — ABNORMAL HIGH (ref 70–99)
Potassium: 4.4 mmol/L (ref 3.5–5.1)
Sodium: 132 mmol/L — ABNORMAL LOW (ref 135–145)

## 2024-09-07 LAB — COOXEMETRY PANEL
Carboxyhemoglobin: 0.7 % (ref 0.5–1.5)
Methemoglobin: 0.7 % (ref 0.0–1.5)
O2 Saturation: 50.9 %
Total hemoglobin: 7.9 g/dL — ABNORMAL LOW (ref 12.0–16.0)

## 2024-09-07 NOTE — Progress Notes (Signed)
 PROGRESS NOTE    Dean Croft Sr.  FMW:[id number] DOB: May 18, 1951 DOA: 08/22/2024 PCP: [name], MD   Brief Narrative: This 73 yrs old male with PMH significant for CAD, PAF, ICM, cardiac arrest s/p AICD, HFrEF (EF 35-40% in 04/2024), hypertension, hyperlipidemia, type 2 diabetes, obstructive sleep apnea and recent admission on 07/16/24 for HFrEF exacerbation presented to hospital with back pain after falling from the wheelchair.  He was noted to have acute kidney injury and T6 fracture with lower extremity edema.  Patient's hospital stay was complicated with heart failure with worsening renal functions and cardiology and nephrology were consulted.  Patient had hemodialysis initiated for volume management and underwent thoracocentesis on 11-28 and 12-5 for worsening right pleural effusion.  Also required IV iron .     At this time, patient is being followed by cardiology and is on dobutamine  drip. He underwent hemodialysis for volume management. Nephro signed off , No more HD. TDC removed.  Assessment & Plan:   Principal Problem:   Multiple traumatic injuries Active Problems:   Cardiorenal syndrome with renal failure   Cardiorenal syndrome  Cardiorenal syndrome with renal failure: Low output biventricular heart failure: Cardiology following.  Last 2D echo showed  EF of 35 to 40%.  Cardiac catheterization 06/19/24 showed moderate pulmonary hypertension and low cardiac output.  Patient was initially on milrinone  drip with cardiology which was subsequently discontinued but at this time has been started on dobutamine  gtt. Continue strict intake and output charting. Denies any chest pain, ambulating much better with physical therapy. Continue dobutamine  infusion, Torsemide  80 mg daily. Status post tunneled cath placement for home inotropic therapy. TOC working on finding infusion provider.  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 [name] as outpatient.  Latest creatinine of 1.5  and trended up today to 2.30 Patient briefly required hemo dialysis inpatient.  No need for further dialysis.  TDC catheter removed 12/11. Started on torsemide  80 mg daily..  Nephrology  signed off.   Acute on chronic anemia: Likely from CKD and iron  deficiency.  Received IV iron .  Latest hemoglobin of 7.9 from 7.1.  Status post Aranesp .   Acute T6 fracture, physical deconditioning: Neurosurgery was consulted.  Recommended TSLO brace and outpatient follow-up with neurosurgery Dr. [name].   Continue PT, OT while in the hospital.   R pleural effusion: RML/RLL consolidation Patient underwent IR guided thoracentesis on 11/28 with removal of 500 mL of bloody fluid.  Patient also had repeat thoracentesis on 12/5, with removal of 1.5L of bloody fluid.  Currently on room air and remained stable.   Hyperkalemia : Resolved and now with mild hypokalemia.  Replaced.  Continue to monitor.   Liver enhancement on imaging: Could be secondary to amiodarone .   PAF: Continue amiodarone  200mg  daily.   Patient was on Eliquis  5mg  BID (currently held) due to anemia. Eliquis  resumed.   DVT prophylaxis: Eliquis  Code Status: DNR Family Communication: No family at bed side. Disposition Plan:    Status is: Inpatient Remains inpatient appropriate because: Severity of illness.  Patient is not medically ready for discharge.  TOC is working on finding the infusion provider for home inotropic therapy.    Consultants:  Cardiology Nephrology  Procedures:  Antimicrobials: Anti-infectives (From admission, onward)    Start     Dose/Rate Route Frequency Ordered Stop   08/28/24 1345  ceFAZolin  (ANCEF ) IVPB 2g/100 mL premix        2 g 200 mL/hr over 30 Minutes Intravenous To Radiology 08/28/24 1247 08/28/24  1545      Subjective: Patient was seen and examined at bedside. Overnight events noted. Patient was lying comfortably in the bed,  denies any concerns. He states feeling better. He remains on  dobutamine  drip.  Objective: Vitals:   09/06/24 2308 09/07/24 0322 09/07/24 0441 09/07/24 0723  BP: (!) 132/54 (!) 134/47  (!) 120/42  Pulse: 78   67  Resp: 19 19  14   Temp: 98.6 F (37 C) 98.8 F (37.1 C)  97.7 F (36.5 C)  TempSrc: Oral Oral  Oral  SpO2: 100%   100%  Weight:   90.6 kg   Height:        Intake/Output Summary (Last 24 hours) at 09/07/2024 0923 Last data filed at 09/07/2024 0840 Gross per 24 hour  Intake 1045.04 ml  Output 660 ml  Net 385.04 ml   Filed Weights   09/04/24 0500 09/06/24 0454 09/07/24 0441  Weight: 90.6 kg 90.6 kg 90.6 kg   Examination:  General exam: Appears calm and comfortable, not in any acute distress. Respiratory system: CTA Bilaterally. Respiratory effort normal. RR 13 Cardiovascular system: S1 & S2 heard, RRR. No JVD, murmurs, rubs, gallops or clicks.  Gastrointestinal system: Abdomen is non distended, soft and non tender. Normal bowel sounds heard. Central nervous system: Alert and oriented x 3. No focal neurological deficits. Extremities: No edema, no cyanosis, no clubbing. Skin: No rashes, lesions or ulcers Psychiatry: Judgement and insight appear normal. Mood & affect appropriate.   Data Reviewed: I have personally reviewed following labs and imaging studies  CBC: Recent Labs  Lab 09/03/24 0529 09/04/24 0510 09/05/24 0500 09/05/24 0841 09/06/24 0526  WBC 7.5 7.8 7.6 8.2 8.2  HGB 7.5* 7.8* 7.5* 7.6* 7.9*  HCT 23.9* 24.7* 23.6* 24.0* 24.1*  MCV 94.8 95.0 95.2 93.4 93.1  PLT 145* 153 118* 144* 118*   Basic Metabolic Panel: Recent Labs  Lab 09/02/24 0500 09/03/24 0529 09/04/24 0500 09/04/24 0510 09/05/24 0500 09/05/24 0841 09/06/24 0526  NA 132* 132*  133* 131* 132* 132* 132* 132*  K 3.7 3.3*  3.3* 3.5 3.6 3.6 3.6 4.4  CL 97* 96*  94* 96* 96* 96* 96* 98  CO2 20* 30  28 27 27 25 25 28   GLUCOSE 92 98  95 104* 102* 91 89 101*  BUN 38* 19  21 23 23  26* 27* 30*  CREATININE 2.48* 1.53*  1.48* 1.69* 1.68*  1.92* 1.95* 2.30*  CALCIUM  9.0 9.1  8.7* 9.0 9.0 9.4 9.4 10.0  MG 1.8 1.7  --  1.8 2.0  --  2.0  PHOS 3.1 2.0* 2.1*  --  2.2* 2.2*  --    GFR: Estimated Creatinine Clearance: 29.7 mL/min (A) (by C-G formula based on SCr of 2.3 mg/dL (H)). Liver Function Tests: Recent Labs  Lab 09/01/24 0558 09/02/24 0500 09/03/24 0529 09/04/24 0500 09/05/24 0841  ALBUMIN  3.2* 3.0* 3.1* 3.3* 3.3*   No results for input(s): LIPASE, AMYLASE in the last 168 hours. No results for input(s): AMMONIA in the last 168 hours. Coagulation Profile: No results for input(s): INR, PROTIME in the last 168 hours. Cardiac Enzymes: No results for input(s): CKTOTAL, CKMB, CKMBINDEX, TROPONINI in the last 168 hours. BNP (last 3 results) No results for input(s): PROBNP in the last 8760 hours. HbA1C: No results for input(s): HGBA1C in the last 72 hours. CBG: No results for input(s): GLUCAP in the last 168 hours. Lipid Profile: No results for input(s): CHOL, HDL, LDLCALC, TRIG, CHOLHDL, LDLDIRECT in the last  72 hours. Thyroid Function Tests: No results for input(s): TSH, T4TOTAL, FREET4, T3FREE, THYROIDAB in the last 72 hours. Anemia Panel: No results for input(s): VITAMINB12, FOLATE, FERRITIN, TIBC, IRON , RETICCTPCT in the last 72 hours. Sepsis Labs: No results for input(s): PROCALCITON, LATICACIDVEN in the last 168 hours.  No results found for this or any previous visit (from the past 240 hours).   Radiology Studies: IR TUNNELED CENTRAL VENOUS CATH [facility] W IMG Result Date: 09/05/2024 INDICATION: 73 year old male with chronic kidney disease in need of durable venous access. He presents for tunneled central venous catheter placement. EXAM: IR TUNNELED CENTRAL VENOUS CATH PLC W IMG MEDICATIONS: None ANESTHESIA/SEDATION: None FLUOROSCOPY TIME:  Radiation exposure index: 10.9 mGy, air kerma COMPLICATIONS: None immediate. PROCEDURE: Informed written consent  was obtained from the patient after a thorough discussion of the procedural risks, benefits and alternatives. All questions were addressed. Maximal Sterile Barrier Technique was utilized including caps, mask, sterile gowns, sterile gloves, sterile drape, hand hygiene and skin antiseptic. A timeout was performed prior to the initiation of the procedure. The right internal jugular vein was interrogated with ultrasound and found to be patent. A small amount of nonocclusive thrombus is present along the vessel wall likely representing chronic thrombus or residual fibrin sheath from a prior venous access. An image was obtained and stored for the medical record. Local anesthesia was attained by infiltration with 1% lidocaine . A small dermatotomy was made. Under real-time sonographic guidance, the vessel was punctured with a 21 gauge micropuncture needle. Using standard technique, the initial micro needle was exchanged over a 0.018 micro wire for a peel-away sheath which was advanced into the right atrium. A suitable skin exit site was selected on the anterior chest wall. Local anesthesia was attained by infiltration with 1% lidocaine . A small dermatotomy was made. A tunneled single lumen central venous catheter was then tunneled from the skin exit site to the dermatotomy overlying the venous access site. The catheter was cut to length (24 cm) and advanced through the peel-away sheath. The peel-away sheath was removed. An image was obtained demonstrating the tip of the catheter at the cavoatrial junction. The catheter flushes and aspirates easily. The catheter was capped, flushed and secured to the skin with 0 Prolene suture. Sterile bandages were applied. IMPRESSION: Successful placement of a single-lumen power injectable tunneled central venous catheter via the right internal jugular vein. Catheter tip is at the cavoatrial junction and ready for immediate use. Nonocclusive thrombus present within the right internal  jugular vein likely from a prior central venous access catheter. Electronically Signed   By: Wilkie Lent M.D.   On: 09/05/2024 14:33   Scheduled Meds:  acetaminophen   650 mg Oral Q6H   amiodarone   200 mg Oral Daily   apixaban   5 mg Oral BID   atorvastatin   40 mg Oral Daily   Chlorhexidine  Gluconate Cloth  6 each Topical Daily   Chlorhexidine  Gluconate Cloth  6 each Topical Q0600   Chlorhexidine  Gluconate Cloth  6 each Topical Q0600   collagenase    Topical Daily   darbepoetin (ARANESP ) injection - DIALYSIS  150 mcg Subcutaneous Q Tue-1800   hydrocerin   Topical BID   polyethylene glycol  17 g Oral Daily   sodium chloride  flush  10-40 mL Intracatheter Q12H   torsemide   80 mg Oral Daily   Continuous Infusions:  DOBUTamine  2.5 mcg/kg/min (09/05/24 1013)     LOS: 16 days    Time spent: 35 mins    Dean Arrona Khatri,  MD [facility]   If 7PM-7AM, please contact night-coverage

## 2024-09-07 NOTE — Progress Notes (Addendum)
 DAILY PROGRESS NOTE   Patient Name: Dean Kaps Sr. Date of Encounter: 09/07/2024 Cardiologist: Vinie JAYSON Maxcy, MD  Chief Complaint   No complaints  Patient Profile   Dean Vanroekel Sr. is a 73 y.o. 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 Juvenal.   Subjective   Creatinine worse today (1.95 >>2.30>>2.47) - CO-OX declining as well at 50.9 today- about net even on torsemide .  Objective   Vitals:   09/07/24 0322 09/07/24 0441 09/07/24 0723 09/07/24 1017  BP: (!) 134/47  (!) 120/42 (!) 122/51  Pulse:   67 71  Resp: 19  14 (!) 24  Temp: 98.8 F (37.1 C)  97.7 F (36.5 C)   TempSrc: Oral  Oral   SpO2:   100% 100%  Weight:  90.6 kg    Height:        Intake/Output Summary (Last 24 hours) at 09/07/2024 1107 Last data filed at 09/07/2024 1018 Gross per 24 hour  Intake 1065.04 ml  Output 660 ml  Net 405.04 ml   Filed Weights   09/04/24 0500 09/06/24 0454 09/07/24 0441  Weight: 90.6 kg 90.6 kg 90.6 kg    Physical Exam   General appearance: alert and no distress Neck: no carotid bruit, no JVD, and thyroid not enlarged, symmetric, no tenderness/mass/nodules Lungs: diminished breath sounds RLL Heart: regular rate and rhythm Abdomen: soft, non-tender; bowel sounds normal; no masses,  no organomegaly and in a back brace Extremities: edema 3+ bilateral pitting edema Neurologic: Grossly normal  Inpatient Medications    Scheduled Meds:  acetaminophen   650 mg Oral Q6H   amiodarone   200 mg Oral Daily   apixaban   5 mg Oral BID   atorvastatin   40 mg Oral Daily   Chlorhexidine  Gluconate Cloth  6 each Topical Daily   Chlorhexidine  Gluconate Cloth  6 each Topical Q0600   Chlorhexidine  Gluconate Cloth  6 each Topical Q0600   collagenase    Topical Daily    darbepoetin (ARANESP ) injection - DIALYSIS  150 mcg Subcutaneous Q Tue-1800   hydrocerin   Topical BID   polyethylene glycol  17 g Oral Daily   sodium chloride  flush  10-40 mL Intracatheter Q12H   torsemide   80 mg Oral Daily    Continuous Infusions:  DOBUTamine  2.5 mcg/kg/min (09/05/24 1013)    PRN Meds: HYDROmorphone  (DILAUDID ) injection, methocarbamol , mouth rinse, oxyCODONE , sodium chloride  flush   Labs   Results for orders placed or performed during the hospital encounter of 08/22/24 (from the past 48 hours)  Cooxemetry Panel (carboxy, met, total hgb, O2 sat)     Status: Abnormal   Collection Time: 09/05/24 11:27 AM  Result Value Ref Range   Total hemoglobin 8.1 (L) 12.0 - 16.0 g/dL   O2 Saturation 34.8 %   Carboxyhemoglobin 2.4 (H) 0.5 - 1.5 %   Methemoglobin <0.7 0.0 - 1.5 %    Comment: Performed at [facility], [address], [place], [place] [address]  Basic metabolic panel     Status: Abnormal   Collection Time: 09/06/24  5:26 AM  Result Value Ref Range   Sodium 132 (L) 135 - 145 mmol/L   Potassium 4.4 3.5 - 5.1 mmol/L   Chloride 98 98 - 111 mmol/L   CO2 28 22 - 32 mmol/L  Glucose, Bld 101 (H) 70 - 99 mg/dL    Comment: Glucose reference range applies only to samples taken after fasting for at least 8 hours.   BUN 30 (H) 8 - 23 mg/dL   Creatinine, Ser 7.69 (H) 0.61 - 1.24 mg/dL   Calcium  10.0 8.9 - 10.3 mg/dL   GFR, Estimated 29 (L) >60 mL/min    Comment: (NOTE) Calculated using the CKD-EPI Creatinine Equation (2021)    Anion gap 6 5 - 15    Comment: Performed at [facility], [address], [place], [place] [address]  Magnesium      Status: None   Collection Time: 09/06/24  5:26 AM  Result Value Ref Range   Magnesium  2.0 1.7 - 2.4 mg/dL    Comment: Performed at [facility], [address], [place], [place] [address]  Cooxemetry Panel (carboxy, met, total hgb, O2 sat)     Status: Abnormal   Collection Time: 09/06/24  5:26 AM  Result  Value Ref Range   Total hemoglobin 8.4 (L) 12.0 - 16.0 g/dL   O2 Saturation 42.0 %   Carboxyhemoglobin 1.6 (H) 0.5 - 1.5 %   Methemoglobin 1.1 0.0 - 1.5 %    Comment: Performed at [facility], [address], [place], [place] [address]  CBC     Status: Abnormal   Collection Time: 09/06/24  5:26 AM  Result Value Ref Range   WBC 8.2 4.0 - 10.5 K/uL   RBC 2.59 (L) 4.22 - 5.81 MIL/uL   Hemoglobin 7.9 (L) 13.0 - 17.0 g/dL   HCT 75.8 (L) 60.9 - 47.9 %   MCV 93.1 80.0 - 100.0 fL   MCH 30.5 26.0 - 34.0 pg   MCHC 32.8 30.0 - 36.0 g/dL   RDW 82.1 (H) 88.4 - 84.4 %   Platelets 118 (L) 150 - 400 K/uL   nRBC 0.0 0.0 - 0.2 %    Comment: Performed at [facility], [address], [place], [place] [address]  Cooxemetry Panel (carboxy, met, total hgb, O2 sat)     Status: Abnormal   Collection Time: 09/07/24  5:14 AM  Result Value Ref Range   Total hemoglobin 7.9 (L) 12.0 - 16.0 g/dL   O2 Saturation 49.0 %   Carboxyhemoglobin 0.7 0.5 - 1.5 %   Methemoglobin 0.7 0.0 - 1.5 %    Comment: Performed at [facility], [address], [place], [place] [address]  Basic metabolic panel     Status: Abnormal   Collection Time: 09/07/24 10:10 AM  Result Value Ref Range   Sodium 132 (L) 135 - 145 mmol/L   Potassium 4.4 3.5 - 5.1 mmol/L   Chloride 97 (L) 98 - 111 mmol/L   CO2 26 22 - 32 mmol/L   Glucose, Bld 115 (H) 70 - 99 mg/dL    Comment: Glucose reference range applies only to samples taken after fasting for at least 8 hours.   BUN 35 (H) 8 - 23 mg/dL   Creatinine, Ser 7.52 (H) 0.61 - 1.24 mg/dL   Calcium  9.4 8.9 - 10.3 mg/dL   GFR, Estimated 27 (L) >60 mL/min    Comment: (NOTE) Calculated using the CKD-EPI Creatinine Equation (2021)    Anion gap 9 5 - 15    Comment: Performed at [facility], [address], [place], [place] [address]    ECG   Paced rhythm - Personally Reviewed  Telemetry   Paced rhythm -  Personally Reviewed  Radiology    IR TUNNELED CENTRAL  VENOUS CATH [facility] W IMG Result Date: 09/05/2024 INDICATION: 73 year old male with chronic kidney disease in need of durable venous access. He presents for tunneled central venous catheter placement. EXAM: IR TUNNELED CENTRAL VENOUS CATH PLC W IMG MEDICATIONS: None ANESTHESIA/SEDATION: None FLUOROSCOPY TIME:  Radiation exposure index: 10.9 mGy, air kerma COMPLICATIONS: None immediate. PROCEDURE: Informed written consent was obtained from the patient after a thorough discussion of the procedural risks, benefits and alternatives. All questions were addressed. Maximal Sterile Barrier Technique was utilized including caps, mask, sterile gowns, sterile gloves, sterile drape, hand hygiene and skin antiseptic. A timeout was performed prior to the initiation of the procedure. The right internal jugular vein was interrogated with ultrasound and found to be patent. A small amount of nonocclusive thrombus is present along the vessel wall likely representing chronic thrombus or residual fibrin sheath from a prior venous access. An image was obtained and stored for the medical record. Local anesthesia was attained by infiltration with 1% lidocaine . A small dermatotomy was made. Under real-time sonographic guidance, the vessel was punctured with a 21 gauge micropuncture needle. Using standard technique, the initial micro needle was exchanged over a 0.018 micro wire for a peel-away sheath which was advanced into the right atrium. A suitable skin exit site was selected on the anterior chest wall. Local anesthesia was attained by infiltration with 1% lidocaine . A small dermatotomy was made. A tunneled single lumen central venous catheter was then tunneled from the skin exit site to the dermatotomy overlying the venous access site. The catheter was cut to length (24 cm) and advanced through the peel-away sheath. The peel-away sheath was removed. An image was obtained demonstrating the tip of the catheter at the cavoatrial junction.  The catheter flushes and aspirates easily. The catheter was capped, flushed and secured to the skin with 0 Prolene suture. Sterile bandages were applied. IMPRESSION: Successful placement of a single-lumen power injectable tunneled central venous catheter via the right internal jugular vein. Catheter tip is at the cavoatrial junction and ready for immediate use. Nonocclusive thrombus present within the right internal jugular vein likely from a prior central venous access catheter. Electronically Signed   By: Wilkie Lent M.D.   On: 09/05/2024 14:33    Cardiac Studies   N/A  Assessment   Principal Problem:   Multiple traumatic injuries Active Problems:   Cardiorenal syndrome with renal failure   Cardiorenal syndrome Low output biventricular heart failure  Plan   Co-ox declining and creatinine is rising off dialysis - remains on low dose dobutamine . Suspect he may end-up needed dialysis again. Would defer to nephrology based on these changes - they have signed-off, but may need to re-consult them. Hesitant to adjust diuretics or inotropes further.  TIME SPENT WITH PATIENT: 35 minutes of direct patient care. More than 50% of that time was spent on coordination of care and counseling regarding biventricular congestive heart failure and end-stage renal failure.  Length of Stay:  LOS: 16 days   Vinie KYM Maxcy, MD, [facility], FNLA, FACP  [place]  [facility]  Medical Director of the [facility]  [facility] Risk [facility] Diplomate of the American Board of Clinical Lipidology Attending Cardiologist  Direct Dial: [phone]  Fax: [phone]  Website:  [email or web address] Vinie BROCKS Jontae Adebayo 09/07/2024, 11:07 AM

## 2024-09-07 NOTE — Plan of Care (Signed)
 Problem: Education: Goal: Knowledge of General Education information will improve Description: Including pain rating scale, medication(s)/side effects and non-pharmacologic comfort measures 09/07/2024 0007 by Marvis Kenneth SAILOR, RN Outcome: Progressing 09/06/2024 2018 by Marvis Kenneth SAILOR, RN Outcome: Progressing   Problem: Health Behavior/Discharge Planning: Goal: Ability to manage health-related needs will improve 09/07/2024 0007 by Marvis Kenneth SAILOR, RN Outcome: Progressing 09/06/2024 2018 by Marvis Kenneth SAILOR, RN Outcome: Progressing   Problem: Clinical Measurements: Goal: Ability to maintain clinical measurements within normal limits will improve 09/07/2024 0007 by Marvis Kenneth SAILOR, RN Outcome: Progressing 09/06/2024 2018 by Marvis Kenneth SAILOR, RN Outcome: Progressing Goal: Will remain free from infection 09/07/2024 0007 by Marvis Kenneth SAILOR, RN Outcome: Progressing 09/06/2024 2018 by Marvis Kenneth SAILOR, RN Outcome: Progressing Goal: Diagnostic test results will improve 09/07/2024 0007 by Marvis Kenneth SAILOR, RN Outcome: Progressing 09/06/2024 2018 by Marvis Kenneth SAILOR, RN Outcome: Progressing Goal: Respiratory complications will improve 09/07/2024 0007 by Marvis Kenneth SAILOR, RN Outcome: Progressing 09/06/2024 2018 by Marvis Kenneth SAILOR, RN Outcome: Progressing Goal: Cardiovascular complication will be avoided 09/07/2024 0007 by Marvis Kenneth SAILOR, RN Outcome: Progressing 09/06/2024 2018 by Marvis Kenneth SAILOR, RN Outcome: Progressing   Problem: Activity: Goal: Risk for activity intolerance will decrease 09/07/2024 0007 by Marvis Kenneth SAILOR, RN Outcome: Progressing 09/06/2024 2018 by Marvis Kenneth SAILOR, RN Outcome: Progressing   Problem: Nutrition: Goal: Adequate nutrition will be maintained 09/07/2024 0007 by Marvis Kenneth SAILOR, RN Outcome: Progressing 09/06/2024 2018 by Marvis Kenneth SAILOR, RN Outcome: Progressing   Problem: Coping: Goal: Level  of anxiety will decrease 09/07/2024 0007 by Marvis Kenneth SAILOR, RN Outcome: Progressing 09/06/2024 2018 by Marvis Kenneth SAILOR, RN Outcome: Progressing   Problem: Elimination: Goal: Will not experience complications related to bowel motility 09/07/2024 0007 by Marvis Kenneth SAILOR, RN Outcome: Progressing 09/06/2024 2018 by Marvis Kenneth SAILOR, RN Outcome: Progressing Goal: Will not experience complications related to urinary retention 09/07/2024 0007 by Marvis Kenneth SAILOR, RN Outcome: Progressing 09/06/2024 2018 by Marvis Kenneth SAILOR, RN Outcome: Progressing   Problem: Pain Managment: Goal: General experience of comfort will improve and/or be controlled 09/07/2024 0007 by Marvis Kenneth SAILOR, RN Outcome: Progressing 09/06/2024 2018 by Marvis Kenneth SAILOR, RN Outcome: Progressing   Problem: Safety: Goal: Ability to remain free from injury will improve 09/07/2024 0007 by Marvis Kenneth SAILOR, RN Outcome: Progressing 09/06/2024 2018 by Marvis Kenneth SAILOR, RN Outcome: Progressing   Problem: Skin Integrity: Goal: Risk for impaired skin integrity will decrease 09/07/2024 0007 by Marvis Kenneth SAILOR, RN Outcome: Progressing 09/06/2024 2018 by Marvis Kenneth SAILOR, RN Outcome: Progressing   Problem: Education: Goal: Knowledge of disease and its progression will improve 09/07/2024 0007 by Marvis Kenneth SAILOR, RN Outcome: Progressing 09/06/2024 2018 by Marvis Kenneth SAILOR, RN Outcome: Progressing   Problem: Fluid Volume: Goal: Compliance with measures to maintain balanced fluid volume will improve 09/07/2024 0007 by Marvis Kenneth SAILOR, RN Outcome: Progressing 09/06/2024 2018 by Marvis Kenneth SAILOR, RN Outcome: Progressing   Problem: Health Behavior/Discharge Planning: Goal: Ability to manage health-related needs will improve 09/07/2024 0007 by Marvis Kenneth SAILOR, RN Outcome: Progressing 09/06/2024 2018 by Marvis Kenneth SAILOR, RN Outcome: Progressing   Problem: Clinical  Measurements: Goal: Complications related to the disease process, condition or treatment will be avoided or minimized 09/07/2024 0007 by Marvis Kenneth SAILOR, RN Outcome: Progressing 09/06/2024 2018 by Marvis Kenneth SAILOR, RN Outcome: Progressing   Problem: Education: Goal: Knowledge of disease and its progression will improve 09/07/2024 0007 by Marvis Kenneth SAILOR, RN Outcome: Progressing 09/06/2024 2018 by  Marvis Kenneth SAILOR, RN Outcome: Progressing   Problem: Health Behavior/Discharge Planning: Goal: Ability to manage health-related needs will improve 09/07/2024 0007 by Marvis Kenneth SAILOR, RN Outcome: Progressing 09/06/2024 2018 by Marvis Kenneth SAILOR, RN Outcome: Progressing   Problem: Clinical Measurements: Goal: Complications related to the disease process or treatment will be avoided or minimized 09/07/2024 0007 by Marvis Kenneth SAILOR, RN Outcome: Progressing 09/06/2024 2018 by Marvis Kenneth SAILOR, RN Outcome: Progressing Goal: Dialysis access will remain free of complications 09/07/2024 0007 by Marvis Kenneth SAILOR, RN Outcome: Progressing 09/06/2024 2018 by Marvis Kenneth SAILOR, RN Outcome: Progressing   Problem: Activity: Goal: Activity intolerance will improve 09/07/2024 0007 by Marvis Kenneth SAILOR, RN Outcome: Progressing 09/06/2024 2018 by Marvis Kenneth SAILOR, RN Outcome: Progressing   Problem: Fluid Volume: Goal: Fluid volume balance will be maintained or improved 09/07/2024 0007 by Marvis Kenneth SAILOR, RN Outcome: Progressing 09/06/2024 2018 by Marvis Kenneth SAILOR, RN Outcome: Progressing   Problem: Nutritional: Goal: Ability to make appropriate dietary choices will improve 09/07/2024 0007 by Marvis Kenneth SAILOR, RN Outcome: Progressing 09/06/2024 2018 by Marvis Kenneth SAILOR, RN Outcome: Progressing   Problem: Respiratory: Goal: Respiratory symptoms related to disease process will be avoided 09/07/2024 0007 by Marvis Kenneth SAILOR, RN Outcome:  Progressing 09/06/2024 2018 by Marvis Kenneth SAILOR, RN Outcome: Progressing   Problem: Self-Concept: Goal: Body image disturbance will be avoided or minimized 09/07/2024 0007 by Marvis Kenneth SAILOR, RN Outcome: Progressing 09/06/2024 2018 by Marvis Kenneth SAILOR, RN Outcome: Progressing   Problem: Urinary Elimination: Goal: Progression of disease will be identified and treated 09/07/2024 0007 by Marvis Kenneth SAILOR, RN Outcome: Progressing 09/06/2024 2018 by Marvis Kenneth SAILOR, RN Outcome: Progressing

## 2024-09-07 NOTE — Progress Notes (Signed)
 Pt refused wound dressing. Pt only wants the wound to be cleaned and not be wrapped with Ace wrap.

## 2024-09-08 ENCOUNTER — Ambulatory Visit

## 2024-09-08 DIAGNOSIS — T07XXXA Unspecified multiple injuries, initial encounter: Secondary | ICD-10-CM | POA: Diagnosis not present

## 2024-09-08 LAB — COOXEMETRY PANEL
Carboxyhemoglobin: 1.6 % — ABNORMAL HIGH (ref 0.5–1.5)
Carboxyhemoglobin: 2 % — ABNORMAL HIGH (ref 0.5–1.5)
Methemoglobin: 1.7 % — ABNORMAL HIGH (ref 0.0–1.5)
Methemoglobin: 1.1 % (ref 0.0–1.5)
O2 Saturation: 84.4 %
O2 Saturation: 64.9 %
Total hemoglobin: 7.6 g/dL — ABNORMAL LOW (ref 12.0–16.0)
Total hemoglobin: 8.1 g/dL — ABNORMAL LOW (ref 12.0–16.0)

## 2024-09-08 LAB — CREATININE, SERUM
Creatinine, Ser: 2.3 mg/dL — ABNORMAL HIGH (ref 0.61–1.24)
GFR, Estimated: 29 mL/min — ABNORMAL LOW (ref >60)

## 2024-09-08 LAB — BASIC METABOLIC PANEL WITH GFR
Anion gap: 9 (ref 5–15)
BUN: 39 mg/dL — ABNORMAL HIGH (ref 8–23)
CO2: 25 mmol/L (ref 22–32)
Calcium: 9.4 mg/dL (ref 8.9–10.3)
Chloride: 100 mmol/L (ref 98–111)
Creatinine, Ser: 2.49 mg/dL — ABNORMAL HIGH (ref 0.61–1.24)
GFR, Estimated: 26 mL/min — ABNORMAL LOW (ref >60)
Glucose, Bld: 124 mg/dL — ABNORMAL HIGH (ref 70–99)
Potassium: 4 mmol/L (ref 3.5–5.1)
Sodium: 134 mmol/L — ABNORMAL LOW (ref 135–145)

## 2024-09-08 LAB — HEMOGLOBIN AND HEMATOCRIT, BLOOD
HCT: 23.7 % — ABNORMAL LOW (ref 39.0–52.0)
Hemoglobin: 7.5 g/dL — ABNORMAL LOW (ref 13.0–17.0)

## 2024-09-08 LAB — TSH: TSH: 3.29 u[IU]/mL (ref 0.350–4.500)

## 2024-09-08 MED ORDER — FUROSEMIDE 10 MG/ML IJ SOLN
120.0000 mg | Freq: Two times a day (BID) | INTRAVENOUS | Status: AC
  Administered 2024-09-08 (×2): 120 mg via INTRAVENOUS
  Filled 2024-09-08: qty 10
  Filled 2024-09-08: qty 120

## 2024-09-08 MED ORDER — POTASSIUM CHLORIDE CRYS ER 20 MEQ PO TBCR
20.0000 meq | EXTENDED_RELEASE_TABLET | Freq: Once | ORAL | Status: AC
  Administered 2024-09-08: 13:00:00 20 meq via ORAL
  Filled 2024-09-08: qty 1

## 2024-09-08 NOTE — Plan of Care (Signed)
°  Problem: Education: Goal: Knowledge of General Education information will improve Description: Including pain rating scale, medication(s)/side effects and non-pharmacologic comfort measures Outcome: Progressing   Problem: Health Behavior/Discharge Planning: Goal: Ability to manage health-related needs will improve Outcome: Progressing   Problem: Clinical Measurements: Goal: Ability to maintain clinical measurements within normal limits will improve Outcome: Progressing Goal: Will remain free from infection Outcome: Progressing Goal: Diagnostic test results will improve Outcome: Progressing Goal: Respiratory complications will improve Outcome: Progressing Goal: Cardiovascular complication will be avoided Outcome: Progressing   Problem: Activity: Goal: Risk for activity intolerance will decrease Outcome: Progressing   Problem: Nutrition: Goal: Adequate nutrition will be maintained Outcome: Progressing   Problem: Coping: Goal: Level of anxiety will decrease Outcome: Progressing   Problem: Elimination: Goal: Will not experience complications related to bowel motility Outcome: Progressing Goal: Will not experience complications related to urinary retention Outcome: Progressing   Problem: Pain Managment: Goal: General experience of comfort will improve and/or be controlled Outcome: Progressing   Problem: Safety: Goal: Ability to remain free from injury will improve Outcome: Progressing   Problem: Skin Integrity: Goal: Risk for impaired skin integrity will decrease Outcome: Progressing   Problem: Education: Goal: Knowledge of disease and its progression will improve Outcome: Progressing   Problem: Fluid Volume: Goal: Compliance with measures to maintain balanced fluid volume will improve Outcome: Progressing   Problem: Health Behavior/Discharge Planning: Goal: Ability to manage health-related needs will improve Outcome: Progressing   Problem: Clinical  Measurements: Goal: Complications related to the disease process, condition or treatment will be avoided or minimized Outcome: Progressing   Problem: Education: Goal: Knowledge of disease and its progression will improve Outcome: Progressing   Problem: Health Behavior/Discharge Planning: Goal: Ability to manage health-related needs will improve Outcome: Progressing   Problem: Clinical Measurements: Goal: Complications related to the disease process or treatment will be avoided or minimized Outcome: Progressing Goal: Dialysis access will remain free of complications Outcome: Progressing   Problem: Activity: Goal: Activity intolerance will improve Outcome: Progressing   Problem: Fluid Volume: Goal: Fluid volume balance will be maintained or improved Outcome: Progressing   Problem: Nutritional: Goal: Ability to make appropriate dietary choices will improve Outcome: Progressing   Problem: Respiratory: Goal: Respiratory symptoms related to disease process will be avoided Outcome: Progressing   Problem: Self-Concept: Goal: Body image disturbance will be avoided or minimized Outcome: Progressing   Problem: Urinary Elimination: Goal: Progression of disease will be identified and treated Outcome: Progressing

## 2024-09-08 NOTE — Progress Notes (Signed)
 PROGRESS NOTE    Dean Croft Sr.  FMW:[id number] DOB: 1951/04/19 DOA: 08/22/2024 PCP: [name], MD   Brief Narrative: This 73 yrs old male with PMH significant for CAD, PAF, ICM, cardiac arrest s/p AICD, HFrEF (EF 35-40% in 04/2024), hypertension, hyperlipidemia, type 2 diabetes, obstructive sleep apnea and recent admission on 07/16/24 for HFrEF exacerbation presented to hospital with back pain after falling from the wheelchair.  He was noted to have acute kidney injury and T6 fracture with lower extremity edema.  Patient's hospital stay was complicated with heart failure with worsening renal functions and cardiology and nephrology were consulted.  Patient had hemodialysis initiated for volume management and underwent thoracocentesis on 11-28 and 12-5 for worsening right pleural effusion.  Also required IV iron .     At this time, patient is being followed by cardiology and is on dobutamine  drip. He underwent hemodialysis for volume management. Nephro signed off , No more HD. TDC removed.  Assessment & Plan:   Principal Problem:   Multiple traumatic injuries Active Problems:   Cardiorenal syndrome with renal failure   Cardiorenal syndrome  Cardiorenal syndrome with renal failure: Low output biventricular heart failure: Cardiology following.  Last 2D echo showed  EF of 35 to 40%.  Cardiac catheterization 06/19/24 showed moderate pulmonary hypertension and low cardiac output.  Patient was initially on milrinone  drip with cardiology which was subsequently discontinued but at this time has been started on dobutamine  gtt. Continue strict intake and output charting. Denies any chest pain, ambulating much better with physical therapy. Continue dobutamine  infusion,  started on IV  Lasix  120 mg every 12 hours x 2 doses. Status post tunneled cath placement for home inotropic therapy. TOC working on finding a agency for home infusion therapy.  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  [name] as outpatient. Latest creatinine of 1.5 and trended up today to 2.49 Patient briefly required hemo dialysis as inpatient.  No need for further dialysis.  TDC catheter removed 12/11. Nephrology reconsulted for increase in serum creatinine.   Acute on chronic anemia: Likely from CKD and iron  deficiency.  Received IV iron .  Latest hemoglobin of 7.9 from 7.1.  Status post Aranesp .   Acute T6 fracture, physical deconditioning: Neurosurgery was consulted.  Recommended TSLO brace and outpatient follow-up with neurosurgery Dr. [name].   Continue PT, OT while in the hospital.   R pleural effusion: RML/RLL consolidation Patient underwent IR guided thoracentesis on 11/28 with removal of 500 mL of bloody fluid.  Patient also had repeat thoracentesis on 12/5, with removal of 1.5L of bloody fluid.  Currently on room air and remained stable.   Hyperkalemia : Resolved and now with mild hypokalemia.  Replaced.  Continue to monitor.   Liver enhancement on imaging: Could be secondary to amiodarone .   PAF: Continue amiodarone  200mg  daily.   Patient was on Eliquis  5mg  BID (currently held) due to anemia. Eliquis  resumed.   DVT prophylaxis: Eliquis  Code Status: DNR Family Communication: No family at bed side. Disposition Plan:    Status is: Inpatient Remains inpatient appropriate because: Severity of illness.  Patient is not medically ready for discharge.  TOC is working on finding the agency for home inotropic therapy.    Consultants:  Cardiology Nephrology  Procedures:  Antimicrobials: Anti-infectives (From admission, onward)    Start     Dose/Rate Route Frequency Ordered Stop   08/28/24 1345  ceFAZolin  (ANCEF ) IVPB 2g/100 mL premix        2 g 200 mL/hr  over 30 Minutes Intravenous To Radiology 08/28/24 1247 08/28/24 1545      Subjective: Patient was seen and examined at bedside. Overnight events noted. Patient was sitting comfortably in the bed,  denies any concerns. He  states feeling better. He remains on dobutamine  drip.  Objective: Vitals:   09/08/24 0400 09/08/24 0500 09/08/24 0755 09/08/24 1127  BP:   125/65 (!) 133/46  Pulse: 65  63 71  Resp: 16  20 17   Temp:   97.6 F (36.4 C)   TempSrc:   Oral Oral  SpO2: 100%  98% 96%  Weight:  90.5 kg    Height:        Intake/Output Summary (Last 24 hours) at 09/08/2024 1354 Last data filed at 09/08/2024 1324 Gross per 24 hour  Intake 339.15 ml  Output 890 ml  Net -550.85 ml   Filed Weights   09/06/24 0454 09/07/24 0441 09/08/24 0500  Weight: 90.6 kg 90.6 kg 90.5 kg   Examination:  General exam: Appears calm and comfortable, not in any acute distress. Respiratory system: CTA Bilaterally. Respiratory effort normal. RR 15 Cardiovascular system: S1 & S2 heard, RRR. No JVD, murmurs, rubs, gallops or clicks.  Gastrointestinal system: Abdomen is non distended, soft and non tender. Normal bowel sounds heard. Central nervous system: Alert and oriented x 3. No focal neurological deficits. Extremities: No edema, no cyanosis, no clubbing. Skin: No rashes, lesions or ulcers Psychiatry: Judgement and insight appear normal. Mood & affect appropriate.   Data Reviewed: I have personally reviewed following labs and imaging studies  CBC: Recent Labs  Lab 09/03/24 0529 09/04/24 0510 09/05/24 0500 09/05/24 0841 09/06/24 0526 09/08/24 0500  WBC 7.5 7.8 7.6 8.2 8.2  --   HGB 7.5* 7.8* 7.5* 7.6* 7.9* 7.5*  HCT 23.9* 24.7* 23.6* 24.0* 24.1* 23.7*  MCV 94.8 95.0 95.2 93.4 93.1  --   PLT 145* 153 118* 144* 118*  --    Basic Metabolic Panel: Recent Labs  Lab 09/02/24 0500 09/03/24 0529 09/04/24 0500 09/04/24 0510 09/05/24 0500 09/05/24 0841 09/06/24 0526 09/07/24 1010 09/08/24 0500 09/08/24 1038  NA 132* 132*  133* 131* 132* 132* 132* 132* 132*  --  134*  K 3.7 3.3*  3.3* 3.5 3.6 3.6 3.6 4.4 4.4  --  4.0  CL 97* 96*  94* 96* 96* 96* 96* 98 97*  --  100  CO2 20* 30  28 27 27 25 25 28 26   --   25  GLUCOSE 92 98  95 104* 102* 91 89 101* 115*  --  124*  BUN 38* 19  21 23 23  26* 27* 30* 35*  --  39*  CREATININE 2.48* 1.53*  1.48* 1.69* 1.68* 1.92* 1.95* 2.30* 2.47* 2.30* 2.49*  CALCIUM  9.0 9.1  8.7* 9.0 9.0 9.4 9.4 10.0 9.4  --  9.4  MG 1.8 1.7  --  1.8 2.0  --  2.0  --   --   --   PHOS 3.1 2.0* 2.1*  --  2.2* 2.2*  --   --   --   --    GFR: Estimated Creatinine Clearance: 27.4 mL/min (A) (by C-G formula based on SCr of 2.49 mg/dL (H)). Liver Function Tests: Recent Labs  Lab 09/02/24 0500 09/03/24 0529 09/04/24 0500 09/05/24 0841  ALBUMIN  3.0* 3.1* 3.3* 3.3*   No results for input(s): LIPASE, AMYLASE in the last 168 hours. No results for input(s): AMMONIA in the last 168 hours. Coagulation Profile: No results for input(s):  INR, PROTIME in the last 168 hours. Cardiac Enzymes: No results for input(s): CKTOTAL, CKMB, CKMBINDEX, TROPONINI in the last 168 hours. BNP (last 3 results) No results for input(s): PROBNP in the last 8760 hours. HbA1C: No results for input(s): HGBA1C in the last 72 hours. CBG: No results for input(s): GLUCAP in the last 168 hours. Lipid Profile: No results for input(s): CHOL, HDL, LDLCALC, TRIG, CHOLHDL, LDLDIRECT in the last 72 hours. Thyroid Function Tests: Recent Labs    09/08/24 1039  TSH 3.290   Anemia Panel: No results for input(s): VITAMINB12, FOLATE, FERRITIN, TIBC, IRON , RETICCTPCT in the last 72 hours. Sepsis Labs: No results for input(s): PROCALCITON, LATICACIDVEN in the last 168 hours.  No results found for this or any previous visit (from the past 240 hours).   Radiology Studies: No results found.  Scheduled Meds:  acetaminophen   650 mg Oral Q6H   amiodarone   200 mg Oral Daily   apixaban   5 mg Oral BID   atorvastatin   40 mg Oral Daily   Chlorhexidine  Gluconate Cloth  6 each Topical Daily   Chlorhexidine  Gluconate Cloth  6 each Topical Q0600   Chlorhexidine   Gluconate Cloth  6 each Topical Q0600   collagenase    Topical Daily   darbepoetin (ARANESP ) injection - DIALYSIS  150 mcg Subcutaneous Q Tue-1800   hydrocerin   Topical BID   polyethylene glycol  17 g Oral Daily   sodium chloride  flush  10-40 mL Intracatheter Q12H   Continuous Infusions:  DOBUTamine  2.5 mcg/kg/min (09/08/24 1153)   furosemide  120 mg (09/08/24 1036)     LOS: 17 days    Time spent: 50 mins    Darcel Dawley, MD [facility]   If 7PM-7AM, please contact night-coverage

## 2024-09-08 NOTE — Progress Notes (Signed)
 [facility] Progress Note   RECONSULT HPI (brief) 73 year old male with past medical history of HFrEF, history of cardiac arrest status post AICD, CKD, ischemic cardiomyopathy, CAD, PAF, hypertension, DM2, OSA presents with a fall.  Found to have a T6 fracture.  Was also found to have lower extremity swelling.  Hospital stay was complicated by heart failure exacerbation along with worsening renal function.  He also required thoracentesis.  He did briefly undergo hemodialysis, last treatment was on 12/9.  HD performed to help assist with volume management.  Has been on dobutamine  drip Assessment/ Plan:   AKI on CKD4 -fluctuant baseline Cr but overall seems to be around 1.8-low to mid 2's -AKI likely secondary to cardiorenal (initially and now). Previously required dialysis during this admit. Last HD 12/9, TDC removed -agree with increase of lasix  to 120mg  BID. Can utilize metolazone  if needed as well. Continue with dobuatmine -if no response to diuretics and remains volume overloaded then will need to consider reinitiating dialysis, we discussed this as being a possibility -Avoid nephrotoxic medications including NSAIDs and iodinated intravenous contrast exposure unless the latter is absolutely indicated.  Preferred narcotic agents for pain control are hydromorphone , fentanyl , and methadone. Morphine  should not be used. Avoid Baclofen and avoid oral sodium phosphate  and magnesium  citrate based laxatives / bowel preps. Continue strict Input and Output monitoring. Will monitor the patient closely with you and intervene or adjust therapy as indicated by changes in clinical status/labs   Acute on chronic systolic CHF/low output biventricular heart failure -agree with changing diuretics to lasix  120mg  IV BID - On dobutamine  gtt -monitor strict I/O, daily weights -appreciate cardiology  Hyponatremia, mild -hypervolemic, lasix  plan as above  HTN -BP stable  Anemia -receiving  ESA -transfuse prn for Hgb <7  Acute T6 fracture -with brace, PT/OT. Per primary service   Subjective:   Reconsulted for worsening AKI. Patient seen and examined. UOP charted ~0.8L Patient seen and examined bedside. He does report being swollen but otherwise reports feeling well with the exception of back pain with certain positions. He reports that he is urinating well. Denies any n/v, dysgeusia, loss of appetite, chest pain, SOB.   Objective:   BP (!) 133/46 (BP Location: Left Arm)   Pulse 71   Temp 97.6 F (36.4 C) (Oral)   Resp 17   Ht 5' 6 (1.676 m)   Wt 90.5 kg   SpO2 96%   BMI 32.20 kg/m   Intake/Output Summary (Last 24 hours) at 09/08/2024 1413 Last data filed at 09/08/2024 1324 Gross per 24 hour  Intake 339.15 ml  Output 890 ml  Net -550.85 ml   Weight change: -0.1 kg  Physical Exam: Gen: NAD CVS: RRR Resp:decreased breath sound right base otherwise CTA BL, no overt w/r/r/c appreciated Abd: soft nt/nd Ext: 2-3+ pitting edema b/l LEs Neuro: awake, alert, no asterixis/myoclonic jerks observed  Imaging: No results found.  Labs: BMET Recent Labs  Lab 09/02/24 0500 09/03/24 0529 09/04/24 0500 09/04/24 0510 09/05/24 0500 09/05/24 0841 09/06/24 0526 09/07/24 1010 09/08/24 0500 09/08/24 1038  NA 132* 132*  133* 131* 132* 132* 132* 132* 132*  --  134*  K 3.7 3.3*  3.3* 3.5 3.6 3.6 3.6 4.4 4.4  --  4.0  CL 97* 96*  94* 96* 96* 96* 96* 98 97*  --  100  CO2 20* 30  28 27 27 25 25 28 26   --  25  GLUCOSE 92 98  95 104* 102* 91 89 101*  115*  --  124*  BUN 38* 19  21 23 23  26* 27* 30* 35*  --  39*  CREATININE 2.48* 1.53*  1.48* 1.69* 1.68* 1.92* 1.95* 2.30* 2.47* 2.30* 2.49*  CALCIUM  9.0 9.1  8.7* 9.0 9.0 9.4 9.4 10.0 9.4  --  9.4  PHOS 3.1 2.0* 2.1*  --  2.2* 2.2*  --   --   --   --    CBC Recent Labs  Lab 09/04/24 0510 09/05/24 0500 09/05/24 0841 09/06/24 0526 09/08/24 0500  WBC 7.8 7.6 8.2 8.2  --   HGB 7.8* 7.5* 7.6* 7.9* 7.5*  HCT  24.7* 23.6* 24.0* 24.1* 23.7*  MCV 95.0 95.2 93.4 93.1  --   PLT 153 118* 144* 118*  --     Medications:     acetaminophen   650 mg Oral Q6H   amiodarone   200 mg Oral Daily   apixaban   5 mg Oral BID   atorvastatin   40 mg Oral Daily   Chlorhexidine  Gluconate Cloth  6 each Topical Daily   Chlorhexidine  Gluconate Cloth  6 each Topical Q0600   Chlorhexidine  Gluconate Cloth  6 each Topical Q0600   collagenase    Topical Daily   darbepoetin (ARANESP ) injection - DIALYSIS  150 mcg Subcutaneous Q Tue-1800   hydrocerin   Topical BID   polyethylene glycol  17 g Oral Daily   sodium chloride  flush  10-40 mL Intracatheter Q12H      Ephriam Stank, MD [facility] 09/08/2024, 2:13 PM

## 2024-09-08 NOTE — Progress Notes (Addendum)
 Advanced Heart Failure Rounding Note  Cardiologist: Vinie JAYSON Maxcy, MD  HF Cardiologist: Dr. [name]   Chief Complaint: Acute renal failure  Subjective:   Remains on DBA 2.5 mcg, CO-OX 84%.   Creatinine 2.5>2.3   Denies SOB.   Objective:    Weight Range: 90.5 kg Body mass index is 32.2 kg/m.   Vital Signs:   Temp:  [97.6 F (36.4 C)-98.6 F (37 C)] 97.6 F (36.4 C) (12/15 0755) Pulse Rate:  [63-78] 63 (12/15 0755) Resp:  [16-24] 20 (12/15 0755) BP: (119-141)/(38-65) 125/65 (12/15 0755) SpO2:  [94 %-100 %] 98 % (12/15 0755) Weight:  [90.5 kg] 90.5 kg (12/15 0500) Last BM Date : 09/07/24  Weight change: Filed Weights   09/06/24 0454 09/07/24 0441 09/08/24 0500  Weight: 90.6 kg 90.6 kg 90.5 kg   Intake/Output:  Intake/Output Summary (Last 24 hours) at 09/08/2024 0838 Last data filed at 09/08/2024 0649 Gross per 24 hour  Intake 599.15 ml  Output 850 ml  Net -250.85 ml   CVP 20-22  Physical Exam   General:   No resp difficulty Neck: JVP to ear Cor: Regular rate & rhythm.  Lungs: clear Abdomen: soft, nontender, nondistended.  Extremities: LLE unna boot. RLE 2+ edema . RUE PICC  Neuro: alert & oriented x3  Telemetry   SR  Labs    CBC Recent Labs    09/05/24 0841 09/06/24 0526 09/08/24 0500  WBC 8.2 8.2  --   HGB 7.6* 7.9* 7.5*  HCT 24.0* 24.1* 23.7*  MCV 93.4 93.1  --   PLT 144* 118*  --    Basic Metabolic Panel Recent Labs    87/87/74 0841 09/06/24 0526 09/07/24 1010 09/08/24 0500  NA 132* 132* 132*  --   K 3.6 4.4 4.4  --   CL 96* 98 97*  --   CO2 25 28 26   --   GLUCOSE 89 101* 115*  --   BUN 27* 30* 35*  --   CREATININE 1.95* 2.30* 2.47* 2.30*  CALCIUM  9.4 10.0 9.4  --   MG  --  2.0  --   --   PHOS 2.2*  --   --   --    Liver Function Tests Recent Labs    09/05/24 0841  ALBUMIN  3.3*   BNP (last 3 results) Recent Labs    06/25/24 1604 07/10/24 0227 08/22/24 0903  BNP 1,585.3* 1,952.1* 525.0*   Medications:     Scheduled Medications:  acetaminophen   650 mg Oral Q6H   amiodarone   200 mg Oral Daily   apixaban   5 mg Oral BID   atorvastatin   40 mg Oral Daily   Chlorhexidine  Gluconate Cloth  6 each Topical Daily   Chlorhexidine  Gluconate Cloth  6 each Topical Q0600   Chlorhexidine  Gluconate Cloth  6 each Topical Q0600   collagenase    Topical Daily   darbepoetin (ARANESP ) injection - DIALYSIS  150 mcg Subcutaneous Q Tue-1800   hydrocerin   Topical BID   polyethylene glycol  17 g Oral Daily   sodium chloride  flush  10-40 mL Intracatheter Q12H   torsemide   80 mg Oral Daily    Infusions:  DOBUTamine  2.5 mcg/kg/min (09/08/24 0649)    PRN Medications: HYDROmorphone  (DILAUDID ) injection, methocarbamol , mouth rinse, oxyCODONE , sodium chloride  flush  Assessment/Plan   Acute on chronic systolic heart failure: Primarily ischemic, prior history of infarcted RCA.  Multiple recent admission requiring milrinone . Now with progressive cardio-renal syndrome requiring HD.  - Most  recent TTE 8/25 EF 35-40%, RV mildly reduced. TEE 9/25 with moderate MR - initially started on milrinone  for co-ox 59, stopped d/t hypotension and renal function - restarted on milrinone  12/8 for again co-ox 48% - switched milrinone  to dobutamine  12/9 - continue dobutamine  2.5, co-ox 84%. Repeat CO-OX  - CVP up 20-22. Stop torsemide . Start 120 mg IV lasix   bid.   Plan to send home with dobutamine  if we can find an agency to accept his insurance. Social work assisting with this.  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 TEE 9/25 with more moderate MR by PISA, valve area, systolic blunting of pulmonary veins. 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 Now with progressive cardio-renal disease. Nephrology feels that he has a change for renal recovery with inotrope support. - Cr peaked 5 with anuria.  s/p HDC  placement and had iHD. Last session 09/02/24. HD cath removed.  - Creatinine stable 2.3  - As above volume overloaded. Needs IV diuresis today.  - Will continue to follow with Nephrology in outpatient setting   PAF - Previously diagnosed on device - Continue amio 200 mg daily  - Continue eliquis  5 mg BID.  - No bleeding issues. 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  - Check TSH - monitor with DBA    CAD: Single vessel RCA disease - No anginal pain - Continue statin. No aspirin  with need for anticoagulation   Abnormal Chest CT  - posterior right pulmonary nodule is new since prior study + s/o cirrhosis + hepatic lesions concerning for ? Metastatic dz, though liver cirrhosis may be 2/2 HF or amio. Recent LFTs stable.  - differ further w/u to IM/PCP    Anemia of Chronic Disease - hgb on CO-OX 7.6 - IV iron  per primary  GOC DNR  Transfer to 3 east per Dr Cherrie  Length of Stay: 17  Greig Mosses, NP  09/08/2024, 8:38 AM  Advanced Heart Failure Team Pager [phone] (M-F; 7a - 5p)  Please contact [facility] for night-coverage after hours (5p -7a ) and weekends on [email or web address]  Patient seen and examined with the above-signed Advanced Practice Provider and/or Housestaff. I personally reviewed laboratory data, imaging studies and relevant notes. I independently examined the patient and formulated the important aspects of the plan. I have edited the note to reflect any of my changes or salient points. I have personally discussed the plan with the patient and/or family.  Feels depressed this am.   Remains on DBA 2.5 co-ox ok but CVP > 20. Renal function seems to be improving  General:  Sitting up on side of bed. No resp difficulty HEENT: normal Neck: supple.JVP to ear  Cor: Regular rate & rhythm. No rubs, gallops or murmurs. Lungs: clear Abdomen: soft, nontender, nondistended.Good bowel sounds. Extremities: no  cyanosis, clubbing, rash, 2+  edema Neuro: alert & orientedx3, cranial nerves grossly intact. moves all 4 extremities w/o difficulty. Affect pleasant  Remains tenuous. Co-ox ok. Continue DBA. Needs aggressive diuresis. Continue to follow renal function.   Will attempt to move to HF floor for closer monitoring while on DBA.  Toribio Cherrie, MD  2:48 PM

## 2024-09-08 NOTE — TOC Progression Note (Addendum)
 Transition of Care (TOC) - Progression Note    Patient Details  Name: Dean [name]. MRN: [id number] Date of Birth: 1951/05/20  [facility]) CM/SW Contact  Justina Delcia Czar, RN Phone Number: [phone] 09/08/2024, 11:46 AM  Clinical Narrative:    [facility], and instructions given to contacted [facility] # [phone], spoke to pharmacist and requested orders faxed to #[phone] attn Catrina, in Pharmacy. States the out of state rep will review orders. Faxed orders to [facility].   1600 HF CM contacted [facility] and states referral has been received. The information has been sent to the pharmacist for review. Waiting call back for determination if medication is covered and agency available to set up Home Dobutamine .   Expected Discharge Plan: Home w Home Health Services Barriers to Discharge: Continued Medical Work up    Expected Discharge Plan and Services   Discharge Planning Services: CM Consult Post Acute Care Choice: Home Health Living arrangements for the past 2 months: Single Family Home                    HH Arranged: RN, PT   Social Drivers of Health (SDOH) Interventions SDOH Screenings   Food Insecurity: Patient Declined (08/22/2024)  Housing: Unknown (08/22/2024)  Transportation Needs: Patient Declined (08/22/2024)  Utilities: Patient Declined (08/22/2024)  Social Connections: Unknown (08/22/2024)  Tobacco Use: Low Risk (08/22/2024)    Readmission Risk Interventions    05/09/2024    9:53 AM 04/22/2024   11:11 AM  Readmission Risk Prevention Plan  Transportation Screening Complete Complete  PCP or Specialist Appt within 5-7 Days  Complete  Home Care Screening  Complete  Medication Review (RN CM)  Complete  Medication Review (RN Care Manager) Complete   PCP or Specialist appointment within 3-5 days of discharge Complete   HRI or Home Care Consult Complete   SW Recovery  Care/Counseling Consult Complete   Palliative Care Screening Not Applicable   Skilled Nursing Facility Not Applicable

## 2024-09-09 LAB — COOXEMETRY PANEL
Carboxyhemoglobin: 2.4 % — ABNORMAL HIGH (ref 0.5–1.5)
Methemoglobin: <0.7 % (ref 0.0–1.5)
O2 Saturation: 78.7 %
Total hemoglobin: 8.4 g/dL — ABNORMAL LOW (ref 12.0–16.0)

## 2024-09-09 LAB — CBC
HCT: 25.5 % — ABNORMAL LOW (ref 39.0–52.0)
Hemoglobin: 8 g/dL — ABNORMAL LOW (ref 13.0–17.0)
MCH: 30.4 pg (ref 26.0–34.0)
MCHC: 31.4 g/dL (ref 30.0–36.0)
MCV: 97 fL (ref 80.0–100.0)
Platelets: 115 K/uL — ABNORMAL LOW (ref 150–400)
RBC: 2.63 MIL/uL — ABNORMAL LOW (ref 4.22–5.81)
RDW: 17.5 % — ABNORMAL HIGH (ref 11.5–15.5)
WBC: 7.9 K/uL (ref 4.0–10.5)
nRBC: 0 % (ref 0.0–0.2)

## 2024-09-09 LAB — BASIC METABOLIC PANEL WITH GFR
Anion gap: 10 (ref 5–15)
BUN: 42 mg/dL — ABNORMAL HIGH (ref 8–23)
CO2: 25 mmol/L (ref 22–32)
Calcium: 10.2 mg/dL (ref 8.9–10.3)
Chloride: 99 mmol/L (ref 98–111)
Creatinine, Ser: 2.57 mg/dL — ABNORMAL HIGH (ref 0.61–1.24)
GFR, Estimated: 25 mL/min — ABNORMAL LOW (ref >60)
Glucose, Bld: 91 mg/dL (ref 70–99)
Potassium: 4.4 mmol/L (ref 3.5–5.1)
Sodium: 134 mmol/L — ABNORMAL LOW (ref 135–145)

## 2024-09-09 LAB — GLUCOSE, CAPILLARY
Glucose-Capillary: 85 mg/dL (ref 70–99)
Glucose-Capillary: 128 mg/dL — ABNORMAL HIGH (ref 70–99)

## 2024-09-09 LAB — MAGNESIUM: Magnesium: 2 mg/dL (ref 1.7–2.4)

## 2024-09-09 LAB — PHOSPHORUS: Phosphorus: 3.4 mg/dL (ref 2.5–4.6)

## 2024-09-09 MED ORDER — AMIODARONE HCL 200 MG PO TABS
400.0000 mg | ORAL_TABLET | Freq: Every day | ORAL | Status: DC
  Administered 2024-09-10 – 2024-09-15 (×6): 400 mg via ORAL
  Filled 2024-09-09 (×6): qty 2

## 2024-09-09 MED ORDER — DOBUTAMINE-DEXTROSE 4-5 MG/ML-% IV SOLN: 2.5000 ug/kg/min | INTRAVENOUS | 11 refills | Status: DC

## 2024-09-09 MED ORDER — FUROSEMIDE 10 MG/ML IJ SOLN
120.0000 mg | Freq: Two times a day (BID) | INTRAVENOUS | Status: AC
  Administered 2024-09-09 (×2): 120 mg via INTRAVENOUS
  Filled 2024-09-09 (×2): qty 10

## 2024-09-09 MED ORDER — AMIODARONE HCL 200 MG PO TABS
200.0000 mg | ORAL_TABLET | Freq: Once | ORAL | Status: AC
  Administered 2024-09-09: 13:00:00 200 mg via ORAL
  Filled 2024-09-09: qty 1

## 2024-09-09 NOTE — Progress Notes (Signed)
 PROGRESS NOTE    Dean Croft Sr.  FMW:[id number] DOB: May 31, 1951 DOA: 08/22/2024 PCP: [name], MD   Brief Narrative: This 73 yrs old male with PMH significant for CAD, PAF, ICM, cardiac arrest s/p AICD, HFrEF (EF 35-40% in 04/2024), hypertension, hyperlipidemia, type 2 diabetes, obstructive sleep apnea and recent admission on 07/16/24 for HFrEF exacerbation presented to hospital with back pain after falling from the wheelchair.  He was noted to have acute kidney injury and T6 fracture with lower extremity edema.  Patient's hospital stay was complicated with heart failure with worsening renal functions and cardiology and nephrology were consulted.  Patient had hemodialysis initiated for volume management and underwent thoracocentesis on 11-28 and 12-5 for worsening right pleural effusion.  Also required IV iron .     At this time, patient is being followed by cardiology and is on dobutamine  drip. He underwent hemodialysis for volume management. Nephro signed off , No more HD. TDC removed.  Assessment & Plan:   Principal Problem:   Multiple traumatic injuries Active Problems:   Cardiorenal syndrome with renal failure   Cardiorenal syndrome  Cardiorenal syndrome with renal failure: Low output biventricular heart failure: Cardiology following.  Last 2D echo showed  EF of 35 to 40%.  Cardiac catheterization 06/19/24 showed moderate pulmonary hypertension and low cardiac output.  Patient was initially on milrinone  drip with cardiology which was subsequently discontinued but at this time has been started on dobutamine  gtt. Continue strict intake and output charting. Denies any chest pain, ambulating much better with physical therapy. Continue dobutamine  infusion, Continue IV Lasix  120 mg every 12 hours, can use metolazone  if needed as well. Status post tunneled cath placement for home inotropic therapy. TOC working on finding a agency for home infusion therapy.  AKI on CKD-stage  IV: Patient follows up with Dr. [name] as outpatient. Latest creatinine of 1.5 and trended up today to 2.49 Patient briefly required hemo dialysis as inpatient.  No need for further dialysis.  TDC catheter removed 12/11. Nephrology reconsulted for increase in serum creatinine. If no response to diuretics and remains volume overloaded then we will need to consider reinitiating dialysis.   Acute on chronic anemia: Likely from CKD and iron  deficiency.  Received IV iron .  Latest hemoglobin of 8.0 from 7.1.  Status post Aranesp .   Acute T6 fracture, physical deconditioning: Neurosurgery was consulted.  Recommended TSLO brace and outpatient follow-up with neurosurgery Dr. [name].   Continue PT, OT while in the hospital.   R pleural effusion: RML/RLL consolidation Patient underwent IR guided thoracentesis on 11/28 with removal of 500 mL of bloody fluid.  Patient also had repeat thoracentesis on 12/5, with removal of 1.5L of bloody fluid.  Currently on room air and remained stable.   Hyperkalemia : Resolved and now with mild hypokalemia.  Replaced.  Continue to monitor.   Liver enhancement on imaging: Could be secondary to amiodarone .   PAF: Continue amiodarone  200mg  daily.   Patient was on Eliquis  5mg  BID (currently held) due to anemia. Eliquis  resumed.   DVT prophylaxis: Eliquis  Code Status: DNR Family Communication: No family at bed side. Disposition Plan:    Status is: Inpatient Remains inpatient appropriate because: Severity of illness.  Patient is not medically ready for discharge.  TOC is working on finding the agency for home inotropic therapy.    Consultants:  Cardiology Nephrology  Procedures:  Antimicrobials: Anti-infectives (From admission, onward)    Start     Dose/Rate Route Frequency Ordered Stop   08/28/24 1345  ceFAZolin  (ANCEF ) IVPB 2g/100 mL premix        2 g 200 mL/hr over 30 Minutes Intravenous To Radiology 08/28/24 1247 08/28/24 1545       Subjective: Patient was seen and examined at bedside. Overnight events noted. Patient was sitting comfortably in the bed,  denies any concerns. He remains on dobutamine  drip.  He reports feeling better.  He has less feet swelling.  Objective: Vitals:   09/09/24 0307 09/09/24 0500 09/09/24 0741 09/09/24 1122  BP: (!) 134/52  (!) 144/52 (!) 127/55  Pulse: 65  65 72  Resp: 19  18 (!) 21  Temp: 97.7 F (36.5 C)  98.4 F (36.9 C) 98.4 F (36.9 C)  TempSrc: Oral  Oral Oral  SpO2: 96%  96% 96%  Weight:  88.8 kg    Height:        Intake/Output Summary (Last 24 hours) at 09/09/2024 1208 Last data filed at 09/09/2024 1156 Gross per 24 hour  Intake 703.91 ml  Output 3360 ml  Net -2656.09 ml   Filed Weights   09/07/24 0441 09/08/24 0500 09/09/24 0500  Weight: 90.6 kg 90.5 kg 88.8 kg   Examination:  General exam: Appears calm and comfortable, not in any acute distress. Respiratory system: CTA Bilaterally. Respiratory effort normal. RR 14 Cardiovascular system: S1 & S2 heard, RRR. No JVD, murmurs, rubs, gallops or clicks.  Gastrointestinal system: Abdomen is non distended, soft and non tender. Normal bowel sounds heard. Central nervous system: Alert and oriented x 3. No focal neurological deficits. Extremities: No edema, no cyanosis, no clubbing. Skin: No rashes, lesions or ulcers Psychiatry: Judgement and insight appear normal. Mood & affect appropriate.   Data Reviewed: I have personally reviewed following labs and imaging studies  CBC: Recent Labs  Lab 09/04/24 0510 09/05/24 0500 09/05/24 0841 09/06/24 0526 09/08/24 0500 09/09/24 0501  WBC 7.8 7.6 8.2 8.2  --  7.9  HGB 7.8* 7.5* 7.6* 7.9* 7.5* 8.0*  HCT 24.7* 23.6* 24.0* 24.1* 23.7* 25.5*  MCV 95.0 95.2 93.4 93.1  --  97.0  PLT 153 118* 144* 118*  --  115*   Basic Metabolic Panel: Recent Labs  Lab 09/03/24 0529 09/04/24 0500 09/04/24 0510 09/05/24 0500 09/05/24 0841 09/06/24 0526 09/07/24 1010  09/08/24 0500 09/08/24 1038 09/09/24 0501  NA 132*  133* 131* 132* 132* 132* 132* 132*  --  134* 134*  K 3.3*  3.3* 3.5 3.6 3.6 3.6 4.4 4.4  --  4.0 4.4  CL 96*  94* 96* 96* 96* 96* 98 97*  --  100 99  CO2 30  28 27 27 25 25 28 26   --  25 25  GLUCOSE 98  95 104* 102* 91 89 101* 115*  --  124* 91  BUN 19  21 23 23  26* 27* 30* 35*  --  39* 42*  CREATININE 1.53*  1.48* 1.69* 1.68* 1.92* 1.95* 2.30* 2.47* 2.30* 2.49* 2.57*  CALCIUM  9.1  8.7* 9.0 9.0 9.4 9.4 10.0 9.4  --  9.4 10.2  MG 1.7  --  1.8 2.0  --  2.0  --   --   --  2.0  PHOS 2.0* 2.1*  --  2.2* 2.2*  --   --   --   --  3.4   GFR: Estimated Creatinine Clearance: 26.3 mL/min (A) (by C-G formula based on SCr of 2.57 mg/dL (H)). Liver Function Tests: Recent Labs  Lab 09/03/24 0529 09/04/24 0500 09/05/24 0841  ALBUMIN  3.1* 3.3*  3.3*   No results for input(s): LIPASE, AMYLASE in the last 168 hours. No results for input(s): AMMONIA in the last 168 hours. Coagulation Profile: No results for input(s): INR, PROTIME in the last 168 hours. Cardiac Enzymes: No results for input(s): CKTOTAL, CKMB, CKMBINDEX, TROPONINI in the last 168 hours. BNP (last 3 results) No results for input(s): PROBNP in the last 8760 hours. HbA1C: No results for input(s): HGBA1C in the last 72 hours. CBG: Recent Labs  Lab 09/09/24 0807 09/09/24 1120  GLUCAP 85 128*   Lipid Profile: No results for input(s): CHOL, HDL, LDLCALC, TRIG, CHOLHDL, LDLDIRECT in the last 72 hours. Thyroid Function Tests: Recent Labs    09/08/24 1039  TSH 3.290   Anemia Panel: No results for input(s): VITAMINB12, FOLATE, FERRITIN, TIBC, IRON , RETICCTPCT in the last 72 hours. Sepsis Labs: No results for input(s): PROCALCITON, LATICACIDVEN in the last 168 hours.  No results found for this or any previous visit (from the past 240 hours).   Radiology Studies: No results found.  Scheduled Meds:  acetaminophen   650  mg Oral Q6H   amiodarone   200 mg Oral Once   [START ON 09/10/2024] amiodarone   400 mg Oral Daily   apixaban   5 mg Oral BID   atorvastatin   40 mg Oral Daily   Chlorhexidine  Gluconate Cloth  6 each Topical Daily   Chlorhexidine  Gluconate Cloth  6 each Topical Q0600   Chlorhexidine  Gluconate Cloth  6 each Topical Q0600   collagenase    Topical Daily   darbepoetin (ARANESP ) injection - DIALYSIS  150 mcg Subcutaneous Q Tue-1800   hydrocerin   Topical BID   polyethylene glycol  17 g Oral Daily   sodium chloride  flush  10-40 mL Intracatheter Q12H   Continuous Infusions:  DOBUTamine  2.5 mcg/kg/min (09/09/24 0651)   furosemide  120 mg (09/09/24 0953)     LOS: 18 days    Time spent: 50 mins    Darcel Dawley, MD [facility]   If 7PM-7AM, please contact night-coverage

## 2024-09-09 NOTE — Progress Notes (Signed)
  [facility] Progress Note    Assessment/ Plan:   AKI on CKD4 -fluctuant baseline Cr but overall seems to be around 1.8-low to mid 2's -AKI likely secondary to cardiorenal (initially and now). Previously required dialysis during this admit. Last HD 12/9, TDC removed -continue with lasix  to 120mg  BID. Can utilize metolazone  if needed as well. Continue with dobuatmine -if no response to diuretics and remains volume overloaded then will need to consider reinitiating dialysis, we discussed this as being a possibility. Not the case currently -Cr relatively stable as compared to yesterday. 2.49 -> 2.57. Remains nonoliguric -Avoid nephrotoxic medications including NSAIDs and iodinated intravenous contrast exposure unless the latter is absolutely indicated.  Preferred narcotic agents for pain control are hydromorphone , fentanyl , and methadone. Morphine  should not be used. Avoid Baclofen and avoid oral sodium phosphate  and magnesium  citrate based laxatives / bowel preps. Continue strict Input and Output monitoring. Will monitor the patient closely with you and intervene or adjust therapy as indicated by changes in clinical status/labs   Acute on chronic systolic CHF/low output biventricular heart failure -agree with continuing diuretics: lasix  120mg  IV BID given that he remains hypervolemic - On dobutamine  gtt -monitor strict I/O, daily weights -appreciate cardiology  Hyponatremia, mild -hypervolemic, lasix  plan as above. Na stable 134  HTN -BP stable  Anemia -receiving ESA -transfuse prn for Hgb <7 -Hgb 8 today  Acute T6 fracture -with brace, PT/OT. Per primary service   Subjective:   Patient seen and examined bedside. He reports that his swelling is improving, urinating a lot as well. Denies any chest pain, SOB Weight 90.5kg->88.8kg Uop ~2.6L   Objective:   BP (!) 144/52 (BP Location: Left Arm)   Pulse 65   Temp 98.4 F (36.9 C) (Oral)   Resp 18   Ht 5' 6 (1.676  m)   Wt 88.8 kg   SpO2 96%   BMI 31.60 kg/m   Intake/Output Summary (Last 24 hours) at 09/09/2024 1054 Last data filed at 09/09/2024 9348 Gross per 24 hour  Intake 624.27 ml  Output 2660 ml  Net -2035.73 ml   Weight change: -1.7 kg  Physical Exam: Gen: NAD, sitting in recliner CVS: RRR Resp:decreased breath sound right base otherwise CTA BL, no overt w/r/r/c appreciated, unlabored, normal wob Abd: soft nt/nd Ext: 2+ pitting edema b/l Les (currently wrapped) Neuro: awake, alert, no asterixis/myoclonic jerks observed  Imaging: No results found.  Labs: BMET Recent Labs  Lab 09/03/24 0529 09/04/24 0500 09/04/24 0510 09/05/24 0500 09/05/24 0841 09/06/24 0526 09/07/24 1010 09/08/24 0500 09/08/24 1038 09/09/24 0501  NA 132*  133* 131* 132* 132* 132* 132* 132*  --  134* 134*  K 3.3*  3.3* 3.5 3.6 3.6 3.6 4.4 4.4  --  4.0 4.4  CL 96*  94* 96* 96* 96* 96* 98 97*  --  100 99  CO2 30  28 27 27 25 25 28 26   --  25 25  GLUCOSE 98  95 104* 102* 91 89 101* 115*  --  124* 91  BUN 19  21 23 23  26* 27* 30* 35*  --  39* 42*  CREATININE 1.53*  1.48* 1.69* 1.68* 1.92* 1.95* 2.30* 2.47* 2.30* 2.49* 2.57*  CALCIUM  9.1  8.7* 9.0 9.0 9.4 9.4 10.0 9.4  --  9.4 10.2  PHOS 2.0* 2.1*  --  2.2* 2.2*  --   --   --   --  3.4   CBC Recent Labs  Lab 09/05/24 0500 09/05/24 0841  09/06/24 0526 09/08/24 0500 09/09/24 0501  WBC 7.6 8.2 8.2  --  7.9  HGB 7.5* 7.6* 7.9* 7.5* 8.0*  HCT 23.6* 24.0* 24.1* 23.7* 25.5*  MCV 95.2 93.4 93.1  --  97.0  PLT 118* 144* 118*  --  115*    Medications:     acetaminophen   650 mg Oral Q6H   amiodarone   200 mg Oral Once   [START ON 09/10/2024] amiodarone   400 mg Oral Daily   apixaban   5 mg Oral BID   atorvastatin   40 mg Oral Daily   Chlorhexidine  Gluconate Cloth  6 each Topical Daily   Chlorhexidine  Gluconate Cloth  6 each Topical Q0600   Chlorhexidine  Gluconate Cloth  6 each Topical Q0600   collagenase    Topical Daily   darbepoetin  (ARANESP ) injection - DIALYSIS  150 mcg Subcutaneous Q Tue-1800   hydrocerin   Topical BID   polyethylene glycol  17 g Oral Daily   sodium chloride  flush  10-40 mL Intracatheter Q12H      Ephriam Stank, MD [facility] 09/09/2024, 10:54 AM

## 2024-09-09 NOTE — Plan of Care (Signed)
°  Problem: Education: Goal: Knowledge of General Education information will improve Description: Including pain rating scale, medication(s)/side effects and non-pharmacologic comfort measures Outcome: Progressing   Problem: Health Behavior/Discharge Planning: Goal: Ability to manage health-related needs will improve Outcome: Progressing   Problem: Clinical Measurements: Goal: Ability to maintain clinical measurements within normal limits will improve Outcome: Progressing Goal: Will remain free from infection Outcome: Progressing Goal: Diagnostic test results will improve Outcome: Progressing Goal: Respiratory complications will improve Outcome: Progressing Goal: Cardiovascular complication will be avoided Outcome: Progressing   Problem: Activity: Goal: Risk for activity intolerance will decrease Outcome: Progressing   Problem: Nutrition: Goal: Adequate nutrition will be maintained Outcome: Progressing   Problem: Coping: Goal: Level of anxiety will decrease Outcome: Progressing   Problem: Elimination: Goal: Will not experience complications related to bowel motility Outcome: Progressing Goal: Will not experience complications related to urinary retention Outcome: Progressing   Problem: Pain Managment: Goal: General experience of comfort will improve and/or be controlled Outcome: Progressing   Problem: Safety: Goal: Ability to remain free from injury will improve Outcome: Progressing   Problem: Skin Integrity: Goal: Risk for impaired skin integrity will decrease Outcome: Progressing   Problem: Education: Goal: Knowledge of disease and its progression will improve Outcome: Progressing   Problem: Fluid Volume: Goal: Compliance with measures to maintain balanced fluid volume will improve Outcome: Progressing   Problem: Health Behavior/Discharge Planning: Goal: Ability to manage health-related needs will improve Outcome: Progressing   Problem: Clinical  Measurements: Goal: Complications related to the disease process, condition or treatment will be avoided or minimized Outcome: Progressing   Problem: Education: Goal: Knowledge of disease and its progression will improve Outcome: Progressing   Problem: Health Behavior/Discharge Planning: Goal: Ability to manage health-related needs will improve Outcome: Progressing   Problem: Clinical Measurements: Goal: Complications related to the disease process or treatment will be avoided or minimized Outcome: Progressing Goal: Dialysis access will remain free of complications Outcome: Progressing   Problem: Activity: Goal: Activity intolerance will improve Outcome: Progressing   Problem: Fluid Volume: Goal: Fluid volume balance will be maintained or improved Outcome: Progressing   Problem: Nutritional: Goal: Ability to make appropriate dietary choices will improve Outcome: Progressing   Problem: Respiratory: Goal: Respiratory symptoms related to disease process will be avoided Outcome: Progressing   Problem: Self-Concept: Goal: Body image disturbance will be avoided or minimized Outcome: Progressing   Problem: Urinary Elimination: Goal: Progression of disease will be identified and treated Outcome: Progressing

## 2024-09-09 NOTE — Progress Notes (Signed)
 PT Cancellation Note  Patient Details Name: Dean Newstrom Sr. MRN: [id number] DOB: 03-Mar-1951   Cancelled Treatment:    Reason Eval/Treat Not Completed: Patient at procedure or test/unavailable (working with OT)   Lenoard NOVAK Tyrhonda Georgiades 09/09/2024, 11:47 AM Lenoard SQUIBB, PT Acute Rehabilitation Services Office: [phone]

## 2024-09-09 NOTE — TOC Progression Note (Addendum)
 Transition of Care (TOC) - Progression Note    Patient Details  Name: Dean [name]. MRN: [id number] Date of Birth: 19-Feb-1951  Transition of Care [facility]) CM/SW Contact  Justina Delcia Czar, RN Phone Number: [phone] 09/09/2024, 2:57 PM  Clinical Narrative:     Contacted [facility] to follow up on request for Home Dobutamine . Pharmacy will have rep, Katrina give call me to follow up.   Received call back from [facility] Infusion coordinator, Katrina. Requested RX sent to her email at [email or web address]. States they are working on arranging [facility] and finding infusion company for medication. Scanned RX to email above.   Katrina # [phone] x [phone]  Expected Discharge Plan: Home w Home Health Services Barriers to Discharge: Continued Medical Work up               Expected Discharge Plan and Services   Discharge Planning Services: CM Consult Post Acute Care Choice: Home Health Living arrangements for the past 2 months: Single Family Home                           HH Arranged: RN, PT           Social Drivers of Health (SDOH) Interventions SDOH Screenings   Food Insecurity: Patient Declined (08/22/2024)  Housing: Unknown (08/22/2024)  Transportation Needs: Patient Declined (08/22/2024)  Utilities: Patient Declined (08/22/2024)  Social Connections: Unknown (08/22/2024)  Tobacco Use: Low Risk (08/22/2024)    Readmission Risk Interventions    05/09/2024    9:53 AM 04/22/2024   11:11 AM  Readmission Risk Prevention Plan  Transportation Screening Complete Complete  PCP or Specialist Appt within 5-7 Days  Complete  Home Care Screening  Complete  Medication Review (RN CM)  Complete  Medication Review (RN Care Manager) Complete   PCP or Specialist appointment within 3-5 days of discharge Complete   HRI or Home Care Consult Complete   SW Recovery Care/Counseling Consult Complete   Palliative Care Screening Not Applicable   Skilled  Nursing Facility Not Applicable

## 2024-09-09 NOTE — Progress Notes (Signed)
 Occupational Therapy Treatment Patient Details Name: Dean Coye Sr. MRN: [id number] DOB: [birth year] Today's Date: 09/09/2024   History of present illness 73 yo male adm 08/22/24 s/p fall down 8 stairs in wheelchair. CT (+) T6 vertebral body fx, non-op in TLSO. 11/28, 12/5 thoracentesis. 12/4 TDC placed and iHD 12/4-12/9. Course complicated by CHF and cardiorenal syndrome. PMHx: AICD, arthritis, HLD, HFrEF, ICD, PAF on Eliquis , HTN, CAD, HLD, OSA on cpap, T2DM, CKD, HTN, laminectomy, cardiac arrest.   OT comments  Pt is making steady progress towards their acute OT goals. On arrival pt was sitting in the recliner without TLSO, he is able to verbalize back precautions and his brace schedule despite note wearing the brace. Overall he needed generalized superivsion A for all transfers and functional mobility with RW. He toileted, groomed and UB dressed with up to CGA and cues for spinal precautions. Pt remains limited by activity tolerance. OT to continue to follow acutely to facilitate progress towards established goals. Pt will continue to benefit from [facility]       If plan is discharge home, recommend the following:  A little help with walking and/or transfers;A lot of help with bathing/dressing/bathroom;Assistance with cooking/housework;Direct supervision/assist for medications management;Assist for transportation;Help with stairs or ramp for entrance   Equipment Recommendations  Other (comment)       Precautions / Restrictions Precautions Precautions: Fall;Back Precaution Booklet Issued: No Recall of Precautions/Restrictions: Impaired Precaution/Restrictions Comments: pt able to verbalize precautions but needs cues to maintain with movement  cont. to attempt to bend forward Required Braces or Orthoses: Spinal Brace Spinal Brace: Thoracolumbosacral orthotic;Applied in sitting position Restrictions Weight Bearing Restrictions Per Provider Order: No       Mobility Bed  Mobility Overal bed mobility: Needs Assistance             General bed mobility comments: recliner    Transfers Overall transfer level: Needs assistance Equipment used: Rolling walker (2 wheels) Transfers: Sit to/from Stand Sit to Stand: Supervision                 Balance Overall balance assessment: Needs assistance Sitting-balance support: Feet supported Sitting balance-Leahy Scale: Fair     Standing balance support: Bilateral upper extremity supported, Reliant on assistive device for balance, During functional activity Standing balance-Leahy Scale: Poor           ADL either performed or assessed with clinical judgement   ADL Overall ADL's : Needs assistance/impaired     Grooming: Contact guard assist;Standing Grooming Details (indicate cue type and reason): standing at sink                 Toilet Transfer: Supervision/safety;Ambulation;Regular Toilet;Rolling walker (2 wheels) Toilet Transfer Details (indicate cue type and reason): stood to use urinal, sat for BM Toileting- Clothing Manipulation and Hygiene: Modified independent;Sitting/lateral lean       Functional mobility during ADLs: Supervision/safety;Rolling walker (2 wheels) General ADL Comments: limited by fatigue and activity tolerance    Extremity/Trunk Assessment Upper Extremity Assessment Upper Extremity Assessment: Overall WFL for tasks assessed   Lower Extremity Assessment Lower Extremity Assessment: Defer to PT evaluation        Vision   Vision Assessment?: No apparent visual deficits   Perception Perception Perception: Not tested   Praxis Praxis Praxis: Not tested   Communication Communication Communication: No apparent difficulties   Cognition Arousal: Alert Behavior During Therapy: WFL for tasks assessed/performed Cognition: No apparent impairments  Following commands: Intact        Cueing   Cueing Techniques:  Verbal cues, Gestural cues        General Comments VSS on RA, [place] was off on arrival, left off for session    Pertinent Vitals/ Pain       Pain Assessment Pain Assessment: No/denies pain   Frequency  Min 2X/week        Progress Toward Goals  OT Goals(current goals can now be found in the care plan section)  Progress towards OT goals: Progressing toward goals  ADL Goals Pt Will Perform Lower Body Dressing: sit to/from stand Pt Will Transfer to Toilet: ambulating Additional ADL Goal #1: pt will indep follow spinal precautions during ADLs   AM-PAC OT 6 Clicks Daily Activity     Outcome Measure   Help from another person eating meals?: None Help from another person taking care of personal grooming?: A Little Help from another person toileting, which includes using toliet, bedpan, or urinal?: A Little Help from another person bathing (including washing, rinsing, drying)?: A Lot Help from another person to put on and taking off regular upper body clothing?: A Little Help from another person to put on and taking off regular lower body clothing?: A Lot 6 Click Score: 17    End of Session Equipment Utilized During Treatment: Back brace;Rolling walker (2 wheels)  OT Visit Diagnosis: Unsteadiness on feet (R26.81);Other abnormalities of gait and mobility (R26.89);Muscle weakness (generalized) (M62.81);History of falling (Z91.81);Pain   Activity Tolerance Patient tolerated treatment well   Patient Left in chair;with call bell/phone within reach;with chair alarm set   Nurse Communication Mobility status        Time: 8850-8790 OT Time Calculation (min): 20 min  Charges: OT General Charges $OT Visit: 1 Visit OT Treatments $Self Care/Home Management : 8-22 mins  Dean [name], OTR/L Acute Rehabilitation Services Office [phone] Secure Chat Communication Preferred   Dean [name] 09/09/2024, 12:50 PM

## 2024-09-09 NOTE — Progress Notes (Signed)
 Physical Therapy Treatment Patient Details Name: Dean Domingo Sr. MRN: [id number] DOB: 06-27-1951 Today's Date: 09/09/2024   History of Present Illness 73 yo male adm 08/22/24 s/p fall down 8 stairs in wheelchair. CT (+) T6 vertebral body fx, non-op in TLSO. 11/28, 12/5 thoracentesis. 12/4 TDC placed and iHD 12/4-12/9. Course complicated by CHF and cardiorenal syndrome. PMHx: AICD, arthritis, HLD, HFrEF, ICD, PAF on Eliquis , HTN, CAD, HLD, OSA on cpap, T2DM, CKD, HTN, laminectomy, cardiac arrest.    PT Comments  Pt pleasant in chair on arrival and stating having been up for awhile. Brace riding up on pt chest with assist to position brace and encouragement to mobilize. Pt able to walk in hall and required assist to don/doff shoes and brace correctly. Pt limited by fatigue with SPO2 94% on RA. Encouraged ambulation and mobility with pt educated for precautions and brace wear.     If plan is discharge home, recommend the following: A little help with walking and/or transfers;A little help with bathing/dressing/bathroom;Assistance with cooking/housework;Help with stairs or ramp for entrance   Can travel by private vehicle        Equipment Recommendations  Rolling walker (2 wheels)    Recommendations for Other Services       Precautions / Restrictions Precautions Precautions: Fall;Back Recall of Precautions/Restrictions: Impaired Precaution/Restrictions Comments: pt states precautions but requires cues to maintain. mod assist to don/doff brace Required Braces or Orthoses: Spinal Brace Spinal Brace: Thoracolumbosacral orthotic;Applied in sitting position     Mobility  Bed Mobility Overal bed mobility: Needs Assistance Bed Mobility: Sit to Sidelying         Sit to sidelying: Mod assist General bed mobility comments: mod assist to lift legs to surface to return to bed    Transfers Overall transfer level: Needs assistance   Transfers: Sit to/from Stand Sit to Stand:  Supervision           General transfer comment: pt able to stand from recliner with good hand placement    Ambulation/Gait Ambulation/Gait assistance: Supervision Gait Distance (Feet): 150 Feet Assistive device: Rolling walker (2 wheels) Gait Pattern/deviations: Step-through pattern, Decreased stride length, Trunk flexed   Gait velocity interpretation: <1.8 ft/sec, indicate of risk for recurrent falls   General Gait Details: cues for posture and proximity to [employer]             Wheelchair Mobility     Tilt Bed    Modified Rankin (Stroke Patients Only)       Balance Overall balance assessment: Needs assistance Sitting-balance support: Feet supported Sitting balance-Leahy Scale: Good Sitting balance - Comments: EOB without assist   Standing balance support: Bilateral upper extremity supported, Reliant on assistive device for balance, During functional activity Standing balance-Leahy Scale: Poor Standing balance comment: RW for gait                            Communication Communication Communication: No apparent difficulties  Cognition Arousal: Alert Behavior During Therapy: WFL for tasks assessed/performed   PT - Cognitive impairments: Memory, Problem solving, Safety/Judgement                         Following commands: Intact Following commands impaired: Follows one step commands with increased time    Cueing Cueing Techniques: Verbal cues, Gestural cues  Exercises      General Comments General comments (skin integrity, edema, etc.): VSS on RA, [place]  was off on arrival, left off for session      Pertinent Vitals/Pain Pain Assessment Pain Assessment: No/denies pain    Home Living                          Prior Function            PT Goals (current goals can now be found in the care plan section) Progress towards PT goals: Progressing toward goals    Frequency    Min 2X/week      PT Plan       Co-evaluation              AM-PAC PT 6 Clicks Mobility   Outcome Measure  Help needed turning from your back to your side while in a flat bed without using bedrails?: A Little Help needed moving from lying on your back to sitting on the side of a flat bed without using bedrails?: A Little Help needed moving to and from a bed to a chair (including a wheelchair)?: None Help needed standing up from a chair using your arms (e.g., wheelchair or bedside chair)?: None Help needed to walk in hospital room?: A Little Help needed climbing 3-5 steps with a railing? : A Little 6 Click Score: 20    End of Session Equipment Utilized During Treatment: Back brace Activity Tolerance: Patient tolerated treatment well Patient left: in bed;with call bell/phone within reach Nurse Communication: Mobility status PT Visit Diagnosis: Other abnormalities of gait and mobility (R26.89);Muscle weakness (generalized) (M62.81)     Time: 8768-8745 PT Time Calculation (min) (ACUTE ONLY): 23 min  Charges:    $Gait Training: 8-22 mins $Therapeutic Activity: 8-22 mins PT General Charges $$ ACUTE PT VISIT: 1 Visit                     Dean SQUIBB, PT Acute Rehabilitation Services Office: [phone]    Dean [name] 09/09/2024, 1:11 PM

## 2024-09-09 NOTE — Progress Notes (Addendum)
 Advanced Heart Failure Rounding Note  Cardiologist: Vinie JAYSON Maxcy, MD  HF Cardiologist: Dr. [name]   Chief Complaint: Acute on chronic renal failure  Subjective:   Remains on DBA 2.5 mcg, Co-ox 78%. Recheck pending.  Negative 2L last 24 hrs. Got 120 mg IV lasix  X 2 yesterday. Down 4 lb. CVP 16.   Feeling well. Sitting up in chair. No dyspnea. Legs are less swollen.   Objective:    Weight Range: 88.8 kg Body mass index is 31.6 kg/m.   Vital Signs:   Temp:  [97.6 F (36.4 C)-98.4 F (36.9 C)] 98.4 F (36.9 C) (12/16 0741) Pulse Rate:  [63-78] 65 (12/16 0741) Resp:  [15-27] 18 (12/16 0741) BP: (125-144)/(46-65) 144/52 (12/16 0741) SpO2:  [92 %-100 %] 96 % (12/16 0741) Weight:  [88.8 kg] 88.8 kg (12/16 0500) Last BM Date : 09/07/24  Weight change: Filed Weights   09/07/24 0441 09/08/24 0500 09/09/24 0500  Weight: 90.6 kg 90.5 kg 88.8 kg   Intake/Output:  Intake/Output Summary (Last 24 hours) at 09/09/2024 0745 Last data filed at 09/09/2024 0651 Gross per 24 hour  Intake 624.27 ml  Output 2660 ml  Net -2035.73 ml    Physical Exam  General:  Sitting up in chair. Cor: JVP to jaw. Regular rate & rhythm. No murmurs. Lungs: clear Abdomen: soft, nontender, nondistended. Extremities: 1-2+ edema, + UNNA boots Neuro: alert & orientedx3. Affect pleasant   Telemetry   SR 60s-70s, 10-20 PVCs/min  Labs    CBC Recent Labs    09/08/24 0500 09/09/24 0501  WBC  --  7.9  HGB 7.5* 8.0*  HCT 23.7* 25.5*  MCV  --  97.0  PLT  --  115*   Basic Metabolic Panel Recent Labs    87/84/74 1038 09/09/24 0501  NA 134* 134*  K 4.0 4.4  CL 100 99  CO2 25 25  GLUCOSE 124* 91  BUN 39* 42*  CREATININE 2.49* 2.57*  CALCIUM  9.4 10.2  MG  --  2.0  PHOS  --  3.4   Liver Function Tests No results for input(s): AST, ALT, ALKPHOS, BILITOT, PROT, ALBUMIN  in the last 72 hours.  BNP (last 3 results) Recent Labs    06/25/24 1604 07/10/24 0227  08/22/24 0903  BNP 1,585.3* 1,952.1* 525.0*   Medications:    Scheduled Medications:  acetaminophen   650 mg Oral Q6H   amiodarone   200 mg Oral Daily   apixaban   5 mg Oral BID   atorvastatin   40 mg Oral Daily   Chlorhexidine  Gluconate Cloth  6 each Topical Daily   Chlorhexidine  Gluconate Cloth  6 each Topical Q0600   Chlorhexidine  Gluconate Cloth  6 each Topical Q0600   collagenase    Topical Daily   darbepoetin (ARANESP ) injection - DIALYSIS  150 mcg Subcutaneous Q Tue-1800   hydrocerin   Topical BID   polyethylene glycol  17 g Oral Daily   sodium chloride  flush  10-40 mL Intracatheter Q12H    Infusions:  DOBUTamine  2.5 mcg/kg/min (09/09/24 0651)    PRN Medications: HYDROmorphone  (DILAUDID ) injection, methocarbamol , mouth rinse, oxyCODONE , sodium chloride  flush  Assessment/Plan   Acute on chronic systolic heart failure: Primarily ischemic, prior history of infarcted RCA.  Multiple recent admission requiring milrinone . Now with progressive cardio-renal syndrome requiring HD.  - Most recent TTE 8/25 EF 35-40%, RV mildly reduced. TEE 9/25 with moderate MR - initially started on milrinone  for co-ox 59, stopped d/t hypotension and renal function - restarted on milrinone  12/8  for again co-ox 48% - switched milrinone  to dobutamine  12/9 - continue dobutamine  2.5, co-ox 78%. Repeat.  - Volume slightly better. CVP 16. Continue IV lasix  120 BID. When convert to po torsemide  will likely need BID +/- intermittent metolazone . - Plan to send home with dobutamine  if we can find an agency to accept his insurance. Social work assisting with this.  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 TEE 9/25 with more moderate MR by PISA, valve area, systolic blunting of pulmonary veins. 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 Now with progressive cardio-renal disease. Nephrology  feels that he has a change for renal recovery with inotrope support. - Cr peaked 5 with anuria.  s/p HDC placement and had iHD. Last session 09/02/24. HD cath removed.  - Creatinine 2.5 today - As above volume overloaded. Continue IV diuresis. Nephrology reconsulted - Will continue to follow with Nephrology in outpatient setting   PAF - Previously diagnosed on device - Continue amio 200 mg daily  - Continue eliquis  5 mg BID.  - No bleeding issues.     Hx VT/VF - Has single chamber ICD - On amiodarone  200 mg daily - LFTS stable 08/25/24. TSH okay - Frequent PVCs on DBA. Will increase amiodarone  to 400 mg daily   CAD: Single vessel RCA disease - No anginal pain - Continue statin. No aspirin  with need for anticoagulation   Abnormal Chest CT  - posterior right pulmonary nodule is new since prior study + s/o cirrhosis + hepatic lesions concerning for ? Metastatic dz, though liver cirrhosis may be 2/2 HF or amio. Recent LFTs stable.  - differ further w/u to IM/PCP    Anemia of Chronic Disease - hgb stable at 7 - IV iron  per primary  GOC -DNR/DNI -Not a candidate for advanced therapies.    Length of Stay: 18  FINCH, LINDSAY N, PA-C  09/09/2024, 7:45 AM  Advanced Heart Failure Team Pager [phone] (M-F; 7a - 5p)  Please contact [facility] for night-coverage after hours (5p -7a ) and weekends on [email or web address]   Patient seen and examined with the above-signed Advanced Practice Provider and/or Housestaff. I personally reviewed laboratory data, imaging studies and relevant notes. I independently examined the patient and formulated the important aspects of the plan. I have edited the note to reflect any of my changes or salient points. I have personally discussed the plan with the patient and/or family.  Remains on DBA and IV lasix . Diuresing well. Renal function recovering slwoly.   Feeling better  General:  Sitting up in chair No resp difficulty HEENT: normal Neck: supple.  JVP to jaw  Cor: Regular rate & rhythm. No rubs, gallops or murmurs. Lungs: clear Abdomen: soft, nontender, nondistended.Good bowel sounds. Extremities: no cyanosis, clubbing, rash, 2+ edema + UNNA  Neuro: alert & orientedx3, cranial nerves grossly intact. moves all 4 extremities w/o difficulty. Affect pleasant  Remains volume overloaded but improving. Renal function improving.   Continue DBA and IV diuresis.   Move to 3e when bed available.   [name], MD  12:45 PM

## 2024-09-10 ENCOUNTER — Inpatient Hospital Stay (HOSPITAL_COMMUNITY)

## 2024-09-10 DIAGNOSIS — T07XXXA Unspecified multiple injuries, initial encounter: Secondary | ICD-10-CM | POA: Diagnosis not present

## 2024-09-10 LAB — CBC WITH DIFFERENTIAL/PLATELET
Abs Immature Granulocytes: 0.02 K/uL (ref 0.00–0.07)
Basophils Absolute: 0 K/uL (ref 0.0–0.1)
Basophils Relative: 0 %
Eosinophils Absolute: 0.4 K/uL (ref 0.0–0.5)
Eosinophils Relative: 8 %
HCT: 20.3 % — ABNORMAL LOW (ref 39.0–52.0)
Hemoglobin: 6.3 g/dL — CL (ref 13.0–17.0)
Immature Granulocytes: 0 %
Lymphocytes Relative: 11 %
Lymphs Abs: 0.6 K/uL — ABNORMAL LOW (ref 0.7–4.0)
MCH: 30.3 pg (ref 26.0–34.0)
MCHC: 31 g/dL (ref 30.0–36.0)
MCV: 97.6 fL (ref 80.0–100.0)
Monocytes Absolute: 0.5 K/uL (ref 0.1–1.0)
Monocytes Relative: 9 %
Neutro Abs: 4.1 K/uL (ref 1.7–7.7)
Neutrophils Relative %: 72 %
Platelets: 80 K/uL — ABNORMAL LOW (ref 150–400)
RBC: 2.08 MIL/uL — ABNORMAL LOW (ref 4.22–5.81)
RDW: 17.7 % — ABNORMAL HIGH (ref 11.5–15.5)
WBC: 5.7 K/uL (ref 4.0–10.5)
nRBC: 0 % (ref 0.0–0.2)

## 2024-09-10 LAB — RENAL FUNCTION PANEL
Albumin: 3.8 g/dL (ref 3.5–5.0)
Anion gap: 11 (ref 5–15)
BUN: 42 mg/dL — ABNORMAL HIGH (ref 8–23)
CO2: 26 mmol/L (ref 22–32)
Calcium: 10.4 mg/dL — ABNORMAL HIGH (ref 8.9–10.3)
Chloride: 98 mmol/L (ref 98–111)
Creatinine, Ser: 2.23 mg/dL — ABNORMAL HIGH (ref 0.61–1.24)
GFR, Estimated: 30 mL/min — ABNORMAL LOW (ref >60)
Glucose, Bld: 106 mg/dL — ABNORMAL HIGH (ref 70–99)
Phosphorus: 3 mg/dL (ref 2.5–4.6)
Potassium: 3.9 mmol/L (ref 3.5–5.1)
Sodium: 135 mmol/L (ref 135–145)

## 2024-09-10 LAB — COOXEMETRY PANEL
Carboxyhemoglobin: 1.7 % — ABNORMAL HIGH (ref 0.5–1.5)
Carboxyhemoglobin: 2.2 % — ABNORMAL HIGH (ref 0.5–1.5)
Methemoglobin: 0.8 % (ref 0.0–1.5)
Methemoglobin: <0.7 % (ref 0.0–1.5)
O2 Saturation: 54.3 %
O2 Saturation: 62 %
Total hemoglobin: 8.3 g/dL — ABNORMAL LOW (ref 12.0–16.0)
Total hemoglobin: <6.9 g/dL — CL (ref 12.0–16.0)

## 2024-09-10 LAB — PREPARE RBC (CROSSMATCH)

## 2024-09-10 MED ORDER — SODIUM CHLORIDE 0.9% IV SOLUTION: Freq: Once | INTRAVENOUS | Status: AC

## 2024-09-10 MED ORDER — FUROSEMIDE 10 MG/ML IJ SOLN
120.0000 mg | Freq: Once | INTRAVENOUS | Status: AC
  Administered 2024-09-10: 11:00:00 120 mg via INTRAVENOUS
  Filled 2024-09-10: qty 10

## 2024-09-10 MED ORDER — FUROSEMIDE 10 MG/ML IJ SOLN
120.0000 mg | Freq: Three times a day (TID) | INTRAVENOUS | Status: AC
  Administered 2024-09-10 (×2): 120 mg via INTRAVENOUS
  Filled 2024-09-10 (×2): qty 120

## 2024-09-10 MED ORDER — PANTOPRAZOLE SODIUM 40 MG IV SOLR
40.0000 mg | Freq: Two times a day (BID) | INTRAVENOUS | Status: DC
  Administered 2024-09-10 – 2024-09-14 (×9): 40 mg via INTRAVENOUS
  Filled 2024-09-10 (×9): qty 10

## 2024-09-10 MED ORDER — METOLAZONE 5 MG PO TABS
5.0000 mg | ORAL_TABLET | Freq: Once | ORAL | Status: AC
  Administered 2024-09-10: 11:00:00 5 mg via ORAL
  Filled 2024-09-10: qty 1

## 2024-09-10 MED ORDER — POTASSIUM CHLORIDE CRYS ER 20 MEQ PO TBCR
20.0000 meq | EXTENDED_RELEASE_TABLET | Freq: Once | ORAL | Status: AC
  Administered 2024-09-10: 14:00:00 20 meq via ORAL
  Filled 2024-09-10: qty 1

## 2024-09-10 NOTE — Care Management Important Message (Signed)
 Important Message  Patient Details  Name: Dean Ferrall Sr. MRN: [id number] Date of Birth: 26-Dec-1950   Important Message Given:  Yes - Medicare IM     [name] 09/10/2024, 3:14 PM

## 2024-09-10 NOTE — Progress Notes (Signed)
 PROGRESS NOTE    Dean [name].  FMW:[id number] DOB: 1951-06-24 DOA: 08/22/2024 PCP: [name], MD   Brief Narrative: This 73 yrs old male with PMH significant for CAD, PAF, ICM, cardiac arrest s/p AICD, HFrEF (EF 35-40% in 04/2024), hypertension, hyperlipidemia, type 2 diabetes, obstructive sleep apnea and recent admission on 07/16/24 for HFrEF exacerbation presented to hospital with back pain after falling from the wheelchair.  He was noted to have acute kidney injury and T6 fracture with lower extremity edema.  Patient's hospital stay was complicated with heart failure with worsening renal functions and cardiology and nephrology were consulted.  Patient had hemodialysis initiated for volume management and underwent thoracocentesis on 11-28 and 12-5 for worsening right pleural effusion.  Also required IV iron .     At this time, patient is being followed by Cardiology and Nephrology and is on dobutamine  drip. He underwent hemodialysis for volume management, No more HD. TDC removed.  Assessment & Plan:   Principal Problem:   Multiple traumatic injuries Active Problems:   Cardiorenal syndrome with renal failure   Cardiorenal syndrome  Cardiorenal syndrome with renal failure: Low output biventricular heart failure: Cardiology following.  Last 2D echo showed  EF of 35 to 40%.  Cardiac catheterization 06/19/24 showed moderate pulmonary hypertension and low cardiac output.  Patient was initially on milrinone  drip with cardiology which was subsequently discontinued but at this time has been started on dobutamine  gtt. Continue strict intake and output charting. Denies any chest pain, ambulating much better with physical therapy. Continue dobutamine  infusion, Continue IV Lasix  120 mg every 12 hours,  metolazone  5 mg x 1 today Status post tunneled cath placement for home inotropic therapy. TOC working on finding a agency for home infusion therapy.  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 [name] as outpatient. Latest creatinine of 1.5 and trended up today to 2.49 Patient briefly required hemodialysis as inpatient.  No need for further dialysis.  TDC catheter removed 12/11. Nephrology reconsulted for increase in serum creatinine. If no response to diuretics and remains volume overloaded then we will need to consider reinitiating dialysis.   Acute on chronic anemia: Likely from CKD and iron  deficiency.  Received IV iron .  Hb dropped from 8.0 > 6.9 ,No obvious visible bleeding. Transfuse 2 units PRBC.  Monitor H&H. Hold Eliquis . Obtain chest Xray    Acute T6 fracture, physical deconditioning: Neurosurgery was consulted.  Recommended TSLO brace and outpatient follow-up with neurosurgery Dr. [name].   Continue PT, OT while in the hospital.   R pleural effusion: RML/RLL consolidation Patient underwent IR guided thoracentesis on 11/28 with removal of 500 mL of bloody fluid.   Patient also had repeat thoracentesis on 12/5, with removal of 1.5L of bloody fluid.   Currently on room air and remained stable.   Hyperkalemia : Resolved and now with mild hypokalemia.  Replaced.  Continue to monitor.   Liver enhancement on imaging: Could be secondary to amiodarone .   PAF: Continue amiodarone  200mg  daily.   Patient was on Eliquis  5mg  BID (currently held) due to anemia. Eliquis  held again due to anemia   DVT prophylaxis:SCDs Code Status: DNR Family Communication: family at bed side. Disposition Plan:    Status is: Inpatient Remains inpatient appropriate because: Severity of illness.  Patient is not medically ready for discharge.  TOC is working on finding the agency for home inotropic therapy.    Consultants:  Cardiology Nephrology  Procedures:  Antimicrobials: Anti-infectives (From admission, onward)  Start     Dose/Rate Route Frequency Ordered Stop   08/28/24 1345  ceFAZolin  (ANCEF ) IVPB 2g/100 mL premix        2 g 200 mL/hr over 30 Minutes  Intravenous To Radiology 08/28/24 1247 08/28/24 1545      Subjective: Patient was seen and examined at bedside. Overnight events noted. Patient was sitting comfortably in the bed,  denies any concerns. He remains on dobutamine  drip.  He reports feeling better.  He has less feet swelling.  Objective: Vitals:   09/10/24 0500 09/10/24 0651 09/10/24 1230 09/10/24 1309  BP:  (!) 138/51 (!) 138/42 (!) 134/52  Pulse:  64 71   Resp:  14 17   Temp:   97.8 F (36.6 C) 97.7 F (36.5 C)  TempSrc:   Oral Oral  SpO2:  92% 92%   Weight: 88.9 kg     Height:        Intake/Output Summary (Last 24 hours) at 09/10/2024 1348 Last data filed at 09/10/2024 0640 Gross per 24 hour  Intake 386.51 ml  Output 1075 ml  Net -688.49 ml   Filed Weights   09/08/24 0500 09/09/24 0500 09/10/24 0500  Weight: 90.5 kg 88.8 kg 88.9 kg   Examination:  General exam: Appears calm and comfortable, not in any acute distress. Respiratory system: CTA Bilaterally. Respiratory effort normal. RR 16 Cardiovascular system: S1 & S2 heard, RRR. No JVD, murmurs, rubs, gallops or clicks.  Gastrointestinal system: Abdomen is non distended, soft and non tender. Normal bowel sounds heard. Central nervous system: Alert and oriented x 3. No focal neurological deficits. Extremities: No edema, no cyanosis, no clubbing. Skin: No rashes, lesions or ulcers Psychiatry: Judgement and insight appear normal. Mood & affect appropriate.   Data Reviewed: I have personally reviewed following labs and imaging studies  CBC: Recent Labs  Lab 09/05/24 0500 09/05/24 0841 09/06/24 0526 09/08/24 0500 09/09/24 0501 09/10/24 0826  WBC 7.6 8.2 8.2  --  7.9 5.7  NEUTROABS  --   --   --   --   --  4.1  HGB 7.5* 7.6* 7.9* 7.5* 8.0* 6.3*  HCT 23.6* 24.0* 24.1* 23.7* 25.5* 20.3*  MCV 95.2 93.4 93.1  --  97.0 97.6  PLT 118* 144* 118*  --  115* 80*   Basic Metabolic Panel: Recent Labs  Lab 09/04/24 0500 09/04/24 0510 09/05/24 0500  09/05/24 0841 09/06/24 0526 09/07/24 1010 09/08/24 0500 09/08/24 1038 09/09/24 0501 09/10/24 0445  NA 131* 132* 132* 132* 132* 132*  --  134* 134* 135  K 3.5 3.6 3.6 3.6 4.4 4.4  --  4.0 4.4 3.9  CL 96* 96* 96* 96* 98 97*  --  100 99 98  CO2 27 27 25 25 28 26   --  25 25 26   GLUCOSE 104* 102* 91 89 101* 115*  --  124* 91 106*  BUN 23 23 26* 27* 30* 35*  --  39* 42* 42*  CREATININE 1.69* 1.68* 1.92* 1.95* 2.30* 2.47* 2.30* 2.49* 2.57* 2.23*  CALCIUM  9.0 9.0 9.4 9.4 10.0 9.4  --  9.4 10.2 10.4*  MG  --  1.8 2.0  --  2.0  --   --   --  2.0  --   PHOS 2.1*  --  2.2* 2.2*  --   --   --   --  3.4 3.0   GFR: Estimated Creatinine Clearance: 30.3 mL/min (A) (by C-G formula based on SCr of 2.23 mg/dL (H)). Liver Function  Tests: Recent Labs  Lab 09/04/24 0500 09/05/24 0841 09/10/24 0445  ALBUMIN  3.3* 3.3* 3.8   No results for input(s): LIPASE, AMYLASE in the last 168 hours. No results for input(s): AMMONIA in the last 168 hours. Coagulation Profile: No results for input(s): INR, PROTIME in the last 168 hours. Cardiac Enzymes: No results for input(s): CKTOTAL, CKMB, CKMBINDEX, TROPONINI in the last 168 hours. BNP (last 3 results) No results for input(s): PROBNP in the last 8760 hours. HbA1C: No results for input(s): HGBA1C in the last 72 hours. CBG: Recent Labs  Lab 09/09/24 0807 09/09/24 1120  GLUCAP 85 128*   Lipid Profile: No results for input(s): CHOL, HDL, LDLCALC, TRIG, CHOLHDL, LDLDIRECT in the last 72 hours. Thyroid Function Tests: Recent Labs    09/08/24 1039  TSH 3.290   Anemia Panel: No results for input(s): VITAMINB12, FOLATE, FERRITIN, TIBC, IRON , RETICCTPCT in the last 72 hours. Sepsis Labs: No results for input(s): PROCALCITON, LATICACIDVEN in the last 168 hours.  No results found for this or any previous visit (from the past 240 hours).   Radiology Studies: No results found.  Scheduled Meds:   acetaminophen   650 mg Oral Q6H   amiodarone   400 mg Oral Daily   atorvastatin   40 mg Oral Daily   Chlorhexidine  Gluconate Cloth  6 each Topical Daily   Chlorhexidine  Gluconate Cloth  6 each Topical Q0600   Chlorhexidine  Gluconate Cloth  6 each Topical Q0600   collagenase    Topical Daily   darbepoetin (ARANESP ) injection - DIALYSIS  150 mcg Subcutaneous Q Tue-1800   hydrocerin   Topical BID   pantoprazole  (PROTONIX ) IV  40 mg Intravenous Q12H   polyethylene glycol  17 g Oral Daily   potassium chloride   20 mEq Oral Once   sodium chloride  flush  10-40 mL Intracatheter Q12H   Continuous Infusions:  DOBUTamine  5 mcg/kg/min (09/10/24 1116)   furosemide        LOS: 19 days    Time spent: 50 mins    Darcel Dawley, MD [facility]   If 7PM-7AM, please contact night-coverage

## 2024-09-10 NOTE — Plan of Care (Signed)

## 2024-09-10 NOTE — Progress Notes (Addendum)
 Advanced Heart Failure Rounding Note  Cardiologist: Vinie JAYSON Maxcy, MD  HF Cardiologist: Dr. [name]   Chief Complaint: Acute on chronic HFrEF  Subjective:    Co-ox 62% on 2.5 DBA.   1.7L UOP charted last 24 hrs with IV lasix  120 BID. Weight unchanged. CVP 15.  Hgb 6.3. No reports of melena.  No complaints. Had a little bit of dyspnea yesterday. Leg edema slowly improving.   Objective:    Weight Range: 88.9 kg Body mass index is 31.63 kg/m.   Vital Signs:   Temp:  [97.6 F (36.4 C)-98.4 F (36.9 C)] 97.6 F (36.4 C) (12/17 0421) Pulse Rate:  [64-73] 64 (12/17 0651) Resp:  [14-25] 14 (12/17 0651) BP: (124-144)/(48-55) 138/51 (12/17 0651) SpO2:  [92 %-97 %] 92 % (12/17 0651) Weight:  [88.9 kg] 88.9 kg (12/17 0500) Last BM Date : 09/07/24  Weight change: Filed Weights   09/08/24 0500 09/09/24 0500 09/10/24 0500  Weight: 90.5 kg 88.8 kg 88.9 kg   Intake/Output:  Intake/Output Summary (Last 24 hours) at 09/10/2024 0708 Last data filed at 09/10/2024 0640 Gross per 24 hour  Intake 466.15 ml  Output 1775 ml  Net -1308.85 ml    Physical Exam  General:  Sitting on side of bed. Neck: Jvp to jaw Cor: Regular rate & rhythm. No murmurs. Lungs: clear Abdomen: soft, nontender, + distended. Extremities: 1-2+ edema, + UNNA Neuro: alert & orientedx3. Affect pleasant    Telemetry   SR 60s, less frequent PVCs with higher dose of amiodarone   Labs    CBC Recent Labs    09/08/24 0500 09/09/24 0501  WBC  --  7.9  HGB 7.5* 8.0*  HCT 23.7* 25.5*  MCV  --  97.0  PLT  --  115*   Basic Metabolic Panel Recent Labs    87/83/74 0501 09/10/24 0445  NA 134* 135  K 4.4 3.9  CL 99 98  CO2 25 26  GLUCOSE 91 106*  BUN 42* 42*  CREATININE 2.57* 2.23*  CALCIUM  10.2 10.4*  MG 2.0  --   PHOS 3.4 3.0   Liver Function Tests Recent Labs    09/10/24 0445  ALBUMIN  3.8    BNP (last 3 results) Recent Labs    06/25/24 1604 07/10/24 0227 08/22/24 0903   BNP 1,585.3* 1,952.1* 525.0*   Medications:    Scheduled Medications:  acetaminophen   650 mg Oral Q6H   amiodarone   400 mg Oral Daily   apixaban   5 mg Oral BID   atorvastatin   40 mg Oral Daily   Chlorhexidine  Gluconate Cloth  6 each Topical Daily   Chlorhexidine  Gluconate Cloth  6 each Topical Q0600   Chlorhexidine  Gluconate Cloth  6 each Topical Q0600   collagenase    Topical Daily   darbepoetin (ARANESP ) injection - DIALYSIS  150 mcg Subcutaneous Q Tue-1800   hydrocerin   Topical BID   polyethylene glycol  17 g Oral Daily   sodium chloride  flush  10-40 mL Intracatheter Q12H    Infusions:  DOBUTamine  2.5 mcg/kg/min (09/10/24 0640)    PRN Medications: HYDROmorphone  (DILAUDID ) injection, methocarbamol , mouth rinse, oxyCODONE , sodium chloride  flush  Assessment/Plan   Acute on chronic systolic heart failure: Primarily ischemic, prior history of infarcted RCA.  Multiple recent admission requiring milrinone . Now with progressive cardio-renal syndrome requiring HD.  - Most recent TTE 8/25 EF 35-40%, RV mildly reduced. TEE 9/25 with moderate MR - initially started on milrinone  for co-ox 59, stopped d/t hypotension and renal function -  restarted on milrinone  12/8 for again co-ox 48% - switched milrinone  to dobutamine  12/9 - Co-ox 62% on 2.5 DBA, increase to 5 mcg/kg/min to facilitate diuresis.  - Increase IV lasix  to 120 TID and give 5 mg metolazone  (need additional diuresis w/ transfusions) - Plan to send home with dobutamine  if we can find an agency to accept his insurance. Social work still working on this.  -  nephrology feels that with inotrope he has a chance for renal recovery - GDMT limited by CKD Stage IV  Mitral regurgitation: 2/2 ischemic disease. TEE 9/25 with more moderate MR by PISA, valve area, systolic blunting of pulmonary veins. 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 Now with progressive cardio-renal disease. Nephrology feels that he has a change for  renal recovery with inotrope support. - Cr peaked 5 with anuria.  s/p HDC placement and had iHD. Last session 09/02/24. HD cath removed.  - Creatinine 2.2 today - As above volume overloaded. Continue IV diuresis. Discussed with Nephrology - Will continue to follow with Nephrology in outpatient setting   PAF - Previously diagnosed on device - Amio increased to 400 mg daily 12/16 d/t PVCs - Stop Eliquis  d/t severe anemia/suspected bleeding    Hx VT/VF - Has single chamber ICD - On amiodarone  200 mg daily - LFTS stable 08/25/24. TSH okay - Frequent PVCs on DBA. Improved with increasing amiodarone  to 400 mg daily   CAD: Single vessel RCA disease - No anginal pain - Continue statin. No aspirin  with need for anticoagulation   Abnormal Chest CT  - posterior right pulmonary nodule is new since prior study + s/o cirrhosis + hepatic lesions concerning for ? Metastatic dz, though liver cirrhosis may be 2/2 HF or amio. Recent LFTs stable.  - differ further w/u to IM/PCP    Acute on chronic anemia - hgb 8>6.3 - Give 2 u RBCs - Stop eliquis , risk likely outweighs benefit at this point.  - Check FOBT - Repeat CXR - had thoracentesis X 2 this am with removal of bloody fluid  GOC -DNR/DNI -Not a candidate for advanced therapies.    Length of Stay: 19  Cash Duce N, PA-C  09/10/2024, 7:08 AM  Advanced Heart Failure Team Pager [phone] (M-F; 7a - 5p)  Please contact [facility] for night-coverage after hours (5p -7a ) and weekends on [email or web address]

## 2024-09-10 NOTE — Plan of Care (Signed)
°  Problem: Education: Goal: Knowledge of General Education information will improve Description: Including pain rating scale, medication(s)/side effects and non-pharmacologic comfort measures Outcome: Progressing   Problem: Health Behavior/Discharge Planning: Goal: Ability to manage health-related needs will improve Outcome: Progressing   Problem: Clinical Measurements: Goal: Ability to maintain clinical measurements within normal limits will improve Outcome: Progressing Goal: Will remain free from infection Outcome: Progressing Goal: Diagnostic test results will improve Outcome: Progressing Goal: Respiratory complications will improve Outcome: Progressing Goal: Cardiovascular complication will be avoided Outcome: Progressing   Problem: Activity: Goal: Risk for activity intolerance will decrease Outcome: Progressing   Problem: Nutrition: Goal: Adequate nutrition will be maintained Outcome: Progressing   Problem: Coping: Goal: Level of anxiety will decrease Outcome: Progressing   Problem: Elimination: Goal: Will not experience complications related to bowel motility Outcome: Progressing Goal: Will not experience complications related to urinary retention Outcome: Progressing   Problem: Pain Managment: Goal: General experience of comfort will improve and/or be controlled Outcome: Progressing   Problem: Safety: Goal: Ability to remain free from injury will improve Outcome: Progressing   Problem: Skin Integrity: Goal: Risk for impaired skin integrity will decrease Outcome: Progressing   Problem: Education: Goal: Knowledge of disease and its progression will improve Outcome: Progressing   Problem: Fluid Volume: Goal: Compliance with measures to maintain balanced fluid volume will improve Outcome: Progressing   Problem: Health Behavior/Discharge Planning: Goal: Ability to manage health-related needs will improve Outcome: Progressing   Problem: Clinical  Measurements: Goal: Complications related to the disease process, condition or treatment will be avoided or minimized Outcome: Progressing   Problem: Education: Goal: Knowledge of disease and its progression will improve Outcome: Progressing   Problem: Health Behavior/Discharge Planning: Goal: Ability to manage health-related needs will improve Outcome: Progressing   Problem: Clinical Measurements: Goal: Complications related to the disease process or treatment will be avoided or minimized Outcome: Progressing Goal: Dialysis access will remain free of complications Outcome: Progressing   Problem: Activity: Goal: Activity intolerance will improve Outcome: Progressing   Problem: Fluid Volume: Goal: Fluid volume balance will be maintained or improved Outcome: Progressing   Problem: Nutritional: Goal: Ability to make appropriate dietary choices will improve Outcome: Progressing   Problem: Respiratory: Goal: Respiratory symptoms related to disease process will be avoided Outcome: Progressing   Problem: Self-Concept: Goal: Body image disturbance will be avoided or minimized Outcome: Progressing   Problem: Urinary Elimination: Goal: Progression of disease will be identified and treated Outcome: Progressing

## 2024-09-10 NOTE — Progress Notes (Signed)
° °  Brief Progress Note   _____________________________________________________________________________________________________________  Patient Name: Dean Landi Sr. Patient DOB: [birth year] Date: @TODAY @   Pt is on a cobutamine drip __________________________________________________________________________________________  The [facility] RN Expeditor Ronal DELENA Bald Please contact us  directly via secure chat (search for [facility]) or by calling us  at [phone] [facility]).

## 2024-09-10 NOTE — Progress Notes (Signed)
 [facility] Progress Note    Assessment/ Plan:   AKI on CKD4 -fluctuant baseline Cr but overall seems to be around 1.8-low to mid 2's -AKI likely secondary to cardiorenal (initially and now). Previously required dialysis during this admit. Last HD 12/9, TDC removed -lasix  120mg  TID and one time dose of metolazone  today. Continue with dobuatmine.  -if no response to diuretics and remains volume overloaded then will need to consider reinitiating dialysis, we discussed this as being a possibility. Not the case currently -Cr relatively stable as compared to yesterday. 2.57 -> 2.2 today. Remains nonoliguric -Avoid nephrotoxic medications including NSAIDs and iodinated intravenous contrast exposure unless the latter is absolutely indicated.  Preferred narcotic agents for pain control are hydromorphone , fentanyl , and methadone. Morphine  should not be used. Avoid Baclofen and avoid oral sodium phosphate  and magnesium  citrate based laxatives / bowel preps. Continue strict Input and Output monitoring. Will monitor the patient closely with you and intervene or adjust therapy as indicated by changes in clinical status/labs   Acute on chronic systolic CHF/low output biventricular heart failure -agree with continuing diuretics: lasix  120mg  IV TID along with metolazone  5mg  x 1 dose given that he remains hypervolemic and he is to receive PRBC - On dobutamine  gtt -monitor strict I/O, daily weights -appreciate cardiology  Hyponatremia, mild -hypervolemic, lasix  plan as above. Improved with diuresis  HTN -BP stable  Anemia -receiving ESA -transfuse prn for Hgb <7 -Hgb 8 to 6.3 today, to receive 2units of PRBC. Work up in progress  Acute T6 fracture -PT/OT. Per primary service  Discussed with AHF earlier this AM.   Subjective:   Patient seen and examined bedside. To receive 2u PRBC given worsening anemia UOP ~1.7L Weight relatively unchanged today Patient seen room, discussed  with RN in room as well. Starting first unit of PRBC now. He reports that he is urinating a lot more today given increased diuretic regimen. He did have DOE yesterday which resolved spontaneously. He does report that he feels like his swelling is improving.   Objective:   BP (!) 138/42   Pulse 71   Temp 97.8 F (36.6 C) (Oral)   Resp 17   Ht 5' 6 (1.676 m)   Wt 88.9 kg   SpO2 92%   BMI 31.63 kg/m   Intake/Output Summary (Last 24 hours) at 09/10/2024 1254 Last data filed at 09/10/2024 0640 Gross per 24 hour  Intake 386.51 ml  Output 1075 ml  Net -688.49 ml   Weight change: 0.1 kg  Physical Exam: Gen: NAD, sitting up in bed CVS: RRR Resp:decreased breath sound right base otherwise CTA BL, no overt w/r/r/c appreciated, unlabored, normal wob Abd: soft nt/nd Ext: 2+ pitting edema b/l Les (currently wrapped) Neuro: awake, alert, no asterixis/myoclonic jerks observed  Imaging: No results found.  Labs: BMET Recent Labs  Lab 09/04/24 0500 09/04/24 0510 09/05/24 0500 09/05/24 0841 09/06/24 0526 09/07/24 1010 09/08/24 0500 09/08/24 1038 09/09/24 0501 09/10/24 0445  NA 131*   < > 132* 132* 132* 132*  --  134* 134* 135  K 3.5   < > 3.6 3.6 4.4 4.4  --  4.0 4.4 3.9  CL 96*   < > 96* 96* 98 97*  --  100 99 98  CO2 27   < > 25 25 28 26   --  25 25 26   GLUCOSE 104*   < > 91 89 101* 115*  --  124* 91 106*  BUN 23   < > 26*  27* 30* 35*  --  39* 42* 42*  CREATININE 1.69*   < > 1.92* 1.95* 2.30* 2.47* 2.30* 2.49* 2.57* 2.23*  CALCIUM  9.0   < > 9.4 9.4 10.0 9.4  --  9.4 10.2 10.4*  PHOS 2.1*  --  2.2* 2.2*  --   --   --   --  3.4 3.0   < > = values in this interval not displayed.   CBC Recent Labs  Lab 09/05/24 0841 09/06/24 0526 09/08/24 0500 09/09/24 0501 09/10/24 0826  WBC 8.2 8.2  --  7.9 5.7  NEUTROABS  --   --   --   --  4.1  HGB 7.6* 7.9* 7.5* 8.0* 6.3*  HCT 24.0* 24.1* 23.7* 25.5* 20.3*  MCV 93.4 93.1  --  97.0 97.6  PLT 144* 118*  --  115* 80*     Medications:     acetaminophen   650 mg Oral Q6H   amiodarone   400 mg Oral Daily   atorvastatin   40 mg Oral Daily   Chlorhexidine  Gluconate Cloth  6 each Topical Daily   Chlorhexidine  Gluconate Cloth  6 each Topical Q0600   Chlorhexidine  Gluconate Cloth  6 each Topical Q0600   collagenase    Topical Daily   darbepoetin (ARANESP ) injection - DIALYSIS  150 mcg Subcutaneous Q Tue-1800   hydrocerin   Topical BID   pantoprazole  (PROTONIX ) IV  40 mg Intravenous Q12H   polyethylene glycol  17 g Oral Daily   potassium chloride   20 mEq Oral Once   sodium chloride  flush  10-40 mL Intracatheter Q12H      Ephriam Stank, MD  [facility] 09/10/2024, 12:54 PM

## 2024-09-11 DIAGNOSIS — I1310 Hypertensive heart and chronic kidney disease without heart failure, with stage 1 through stage 4 chronic kidney disease, or unspecified chronic kidney disease: Secondary | ICD-10-CM | POA: Diagnosis not present

## 2024-09-11 DIAGNOSIS — I5021 Acute systolic (congestive) heart failure: Secondary | ICD-10-CM | POA: Diagnosis not present

## 2024-09-11 DIAGNOSIS — T07XXXA Unspecified multiple injuries, initial encounter: Secondary | ICD-10-CM | POA: Diagnosis not present

## 2024-09-11 LAB — RENAL FUNCTION PANEL
Albumin: 4 g/dL (ref 3.5–5.0)
Anion gap: 11 (ref 5–15)
BUN: 42 mg/dL — ABNORMAL HIGH (ref 8–23)
CO2: 26 mmol/L (ref 22–32)
Calcium: 10.3 mg/dL (ref 8.9–10.3)
Chloride: 95 mmol/L — ABNORMAL LOW (ref 98–111)
Creatinine, Ser: 2.08 mg/dL — ABNORMAL HIGH (ref 0.61–1.24)
GFR, Estimated: 33 mL/min — ABNORMAL LOW (ref >60)
Glucose, Bld: 144 mg/dL — ABNORMAL HIGH (ref 70–99)
Phosphorus: 3 mg/dL (ref 2.5–4.6)
Potassium: 3.7 mmol/L (ref 3.5–5.1)
Sodium: 133 mmol/L — ABNORMAL LOW (ref 135–145)

## 2024-09-11 LAB — TYPE AND SCREEN
ABO/RH(D): O POS
Antibody Screen: NEGATIVE
Unit division: 0
Unit division: 0

## 2024-09-11 LAB — MAGNESIUM: Magnesium: 2 mg/dL (ref 1.7–2.4)

## 2024-09-11 LAB — BPAM RBC
Blood Product Expiration Date: 202601142359
Blood Product Expiration Date: 202601142359
ISSUE DATE / TIME: 202512171229
ISSUE DATE / TIME: 202512171701
Unit Type and Rh: 5100
Unit Type and Rh: 5100

## 2024-09-11 LAB — COOXEMETRY PANEL
Carboxyhemoglobin: 3.3 % — ABNORMAL HIGH (ref 0.5–1.5)
Methemoglobin: <0.7 % (ref 0.0–1.5)
O2 Saturation: 69.2 %
Total hemoglobin: 10.2 g/dL — ABNORMAL LOW (ref 12.0–16.0)

## 2024-09-11 LAB — CBC
HCT: 30 % — ABNORMAL LOW (ref 39.0–52.0)
Hemoglobin: 9.9 g/dL — ABNORMAL LOW (ref 13.0–17.0)
MCH: 30.3 pg (ref 26.0–34.0)
MCHC: 33 g/dL (ref 30.0–36.0)
MCV: 91.7 fL (ref 80.0–100.0)
Platelets: 106 K/uL — ABNORMAL LOW (ref 150–400)
RBC: 3.27 MIL/uL — ABNORMAL LOW (ref 4.22–5.81)
RDW: 17.6 % — ABNORMAL HIGH (ref 11.5–15.5)
WBC: 7.1 K/uL (ref 4.0–10.5)
nRBC: 0 % (ref 0.0–0.2)

## 2024-09-11 MED ORDER — POTASSIUM CHLORIDE CRYS ER 20 MEQ PO TBCR
20.0000 meq | EXTENDED_RELEASE_TABLET | Freq: Once | ORAL | Status: AC
  Administered 2024-09-11: 09:00:00 20 meq via ORAL
  Filled 2024-09-11: qty 1

## 2024-09-11 MED ORDER — TORSEMIDE 100 MG PO TABS
100.0000 mg | ORAL_TABLET | Freq: Every day | ORAL | Status: DC
  Administered 2024-09-11 – 2024-09-15 (×5): 100 mg via ORAL
  Filled 2024-09-11 (×5): qty 1

## 2024-09-11 NOTE — Progress Notes (Addendum)
 PROGRESS NOTE  Dean Croft Sr.  FMW:[id number] DOB: Aug 09, 1951 DOA: 08/22/2024 PCP: [name], MD   Brief Narrative: Dean Croft Sr. Is a 73 yrs old male with PMH significant for CAD, PAF, ICM, cardiac arrest s/p AICD, HFrEF (EF 35-40% in 04/2024), hypertension, hyperlipidemia, type 2 diabetes, obstructive sleep apnea and recent admission on 07/16/24 for HFrEF exacerbation presented to hospital with back pain after falling from the wheelchair.  He was noted to have acute kidney injury and T6 fracture with lower extremity edema.  Patient's hospital stay was complicated with heart failure with worsening renal functions and cardiology and nephrology were consulted.  Patient had hemodialysis initiated for volume management and underwent thoracocentesis on 11-28 and 12-5 for worsening right pleural effusion.  Also required IV iron .     At this time, patient is being followed by Cardiology and Nephrology and is on dobutamine  drip. He underwent hemodialysis for volume management, No more HD. TDC removed. Currently pending dispo requiring approval for home infusions.   Assessment & Plan:   Principal Problem:   Multiple traumatic injuries Active Problems:   Cardiorenal syndrome with renal failure   Cardiorenal syndrome  Cardiorenal syndrome with renal failure: Low output biventricular heart failure: Cardiology following.  Last 2D echo showed  EF of 35 to 40%.  Cardiac catheterization 06/19/24 showed moderate pulmonary hypertension and low cardiac output.  Patient was initially on milrinone  drip with cardiology which was subsequently discontinued but at this time has been started on dobutamine  gtt. Continue strict intake and output charting. Denies any chest pain, ambulating much better with physical therapy. Continue dobutamine  infusion, Continue IV Lasix  120 mg every 12 hours,  metolazone  5 mg x 1 today Status post tunneled cath placement for home inotropic therapy. TOC working on finding a  agency for home infusion therapy.  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 [name] as outpatient. Latest creatinine of 1.5 and trended up today to 2.49 Patient briefly required hemodialysis as inpatient.  No need for further dialysis.  TDC catheter removed 12/11. Nephrology reconsulted for increase in serum creatinine. If no response to diuretics and remains volume overloaded then we will need to consider reinitiating dialysis.   Acute on chronic anemia: Likely from CKD and iron  deficiency.  Received IV iron .  Hb dropped from 8.0 > 6.9 ,No obvious visible bleeding. Hgb is stable s/p transfusing 2 units.  Hold Eliquis . Possible restart tomorrow if remains stable   Acute T6 fracture, physical deconditioning: Neurosurgery was consulted.  Recommended TSLO brace and outpatient follow-up with neurosurgery Dr. [name].   Continue PT, OT while in the hospital.   R pleural effusion: RML/RLL consolidation Patient underwent IR guided thoracentesis on 11/28 with removal of 500 mL of bloody fluid.   Patient also had repeat thoracentesis on 12/5, with removal of 1.5L of bloody fluid.   Currently on room air and remained stable.   Hyperkalemia : Resolved    Liver enhancement on imaging: Could be secondary to amiodarone .   PAF: Continue amiodarone  200mg  daily.   Patient was on Eliquis  5mg  BID (currently held) due to anemia. Eliquis  held again due to anemia  DVT prophylaxis:SCDs Code Status: DNR Family Communication: no family at bed side. Disposition Plan:    Status is: Inpatient Remains inpatient appropriate because: Severity of illness.  Patient is not medically ready for discharge.  TOC is working on finding the agency for home inotropic therapy.  Consultants:  Cardiology Nephrology  Procedures:  Antimicrobials: Anti-infectives (From admission, onward)  Start     Dose/Rate Route Frequency Ordered Stop   08/28/24 1345  ceFAZolin  (ANCEF ) IVPB 2g/100 mL premix         2 g 200 mL/hr over 30 Minutes Intravenous To Radiology 08/28/24 1247 08/28/24 1545      Subjective: Patient reports feeling well today. His swelling has improved.   Objective: Vitals:   09/10/24 2055 09/10/24 2337 09/11/24 0339 09/11/24 0349  BP: (!) 148/60 (!) 150/42 (!) 136/44 (!) 131/43  Pulse: 72 71 67 65  Resp: 20 (!) 29 14 17   Temp: 97.9 F (36.6 C) 97.6 F (36.4 C) 97.7 F (36.5 C) 97.7 F (36.5 C)  TempSrc: Oral Oral Oral Oral  SpO2: 92% 99% 94% 91%  Weight:      Height:        Intake/Output Summary (Last 24 hours) at 09/11/2024 0741 Last data filed at 09/11/2024 0616 Gross per 24 hour  Intake 991.27 ml  Output 2950 ml  Net -1958.73 ml   Filed Weights   09/08/24 0500 09/09/24 0500 09/10/24 0500  Weight: 90.5 kg 88.8 kg 88.9 kg   Examination:  General exam: Appears calm and comfortable, not in any acute distress. Respiratory system: CTA Bilaterally. Respiratory effort normal. RR 16 Cardiovascular system: S1 & S2 heard, RRR. No JVD, murmurs, rubs, gallops or clicks.  Central nervous system: Alert and oriented x 3. No focal neurological deficits. Extremities: ankle edema L>R, no cyanosis, no clubbing. Skin: No rashes, lesions or ulcers Psychiatry: Judgement and insight appear normal. Mood & affect appropriate.   Data Reviewed: I have personally reviewed following labs and imaging studies  CBC: Recent Labs  Lab 09/05/24 0841 09/06/24 0526 09/08/24 0500 09/09/24 0501 09/10/24 0826 09/11/24 0426  WBC 8.2 8.2  --  7.9 5.7 7.1  NEUTROABS  --   --   --   --  4.1  --   HGB 7.6* 7.9* 7.5* 8.0* 6.3* 9.9*  HCT 24.0* 24.1* 23.7* 25.5* 20.3* 30.0*  MCV 93.4 93.1  --  97.0 97.6 91.7  PLT 144* 118*  --  115* 80* 106*   Basic Metabolic Panel: Recent Labs  Lab 09/05/24 0500 09/05/24 0841 09/06/24 0526 09/07/24 1010 09/08/24 0500 09/08/24 1038 09/09/24 0501 09/10/24 0445 09/11/24 0426  NA 132* 132* 132* 132*  --  134* 134* 135 133*  K 3.6 3.6 4.4 4.4   --  4.0 4.4 3.9 3.7  CL 96* 96* 98 97*  --  100 99 98 95*  CO2 25 25 28 26   --  25 25 26 26   GLUCOSE 91 89 101* 115*  --  124* 91 106* 144*  BUN 26* 27* 30* 35*  --  39* 42* 42* 42*  CREATININE 1.92* 1.95* 2.30* 2.47* 2.30* 2.49* 2.57* 2.23* 2.08*  CALCIUM  9.4 9.4 10.0 9.4  --  9.4 10.2 10.4* 10.3  MG 2.0  --  2.0  --   --   --  2.0  --  2.0  PHOS 2.2* 2.2*  --   --   --   --  3.4 3.0 3.0    LOS: 20 days    Time spent: 50 mins  Marien LITTIE Piety, MD [facility]   If 7PM-7AM, please contact night-coverage

## 2024-09-11 NOTE — Progress Notes (Signed)
 Advanced Heart Failure Rounding Note  Cardiologist: Dean JAYSON Maxcy, MD  HF Cardiologist: Dr. [name]   Chief Complaint: Acute on chronic HFrEF  Subjective:    Co-ox 69% today on 5 mcg of DBA.   2.9L in UOP yesterday and 2L net negative. CVP 10-11  Scr improving, 2.23>>2.08, K 3.7  Hg 9.9   Overall feeling better. Breathing improved. No resting dyspnea.    Objective:    Weight Range: 88.9 kg Body mass index is 31.63 kg/m.   Vital Signs:   Temp:  [97.6 F (36.4 C)-98.6 F (37 C)] 97.7 F (36.5 C) (12/18 0349) Pulse Rate:  [65-79] 65 (12/18 0349) Resp:  [14-29] 17 (12/18 0349) BP: (127-150)/(37-60) 131/43 (12/18 0349) SpO2:  [91 %-99 %] 91 % (12/18 0349) Last BM Date : 09/07/24  Weight change: Filed Weights   09/08/24 0500 09/09/24 0500 09/10/24 0500  Weight: 90.5 kg 88.8 kg 88.9 kg   Intake/Output:  Intake/Output Summary (Last 24 hours) at 09/11/2024 0738 Last data filed at 09/11/2024 0616 Gross per 24 hour  Intake 991.27 ml  Output 2950 ml  Net -1958.73 ml    Physical Exam   GENERAL: NAD Lungs- clear  CARDIAC:  JVP 9 cm          Normal rate with regular rhythm. No MRG, 1+ b/l LEE L>R  ABDOMEN: Soft, non-tender, non-distended.  EXTREMITIES: Warm and well perfused. +RUE PICC  NEUROLOGIC: No obvious FND  Telemetry   NSR 70s, personally reviewed   Labs    CBC Recent Labs    09/10/24 0826 09/11/24 0426  WBC 5.7 7.1  NEUTROABS 4.1  --   HGB 6.3* 9.9*  HCT 20.3* 30.0*  MCV 97.6 91.7  PLT 80* 106*   Basic Metabolic Panel Recent Labs    87/83/74 0501 09/10/24 0445 09/11/24 0426  NA 134* 135 133*  K 4.4 3.9 3.7  CL 99 98 95*  CO2 25 26 26   GLUCOSE 91 106* 144*  BUN 42* 42* 42*  CREATININE 2.57* 2.23* 2.08*  CALCIUM  10.2 10.4* 10.3  MG 2.0  --  2.0  PHOS 3.4 3.0 3.0   Liver Function Tests Recent Labs    09/10/24 0445 09/11/24 0426  ALBUMIN  3.8 4.0    BNP (last 3 results) Recent Labs    06/25/24 1604 07/10/24 0227  08/22/24 0903  BNP 1,585.3* 1,952.1* 525.0*   Medications:    Scheduled Medications:  acetaminophen   650 mg Oral Q6H   amiodarone   400 mg Oral Daily   atorvastatin   40 mg Oral Daily   Chlorhexidine  Gluconate Cloth  6 each Topical Daily   Chlorhexidine  Gluconate Cloth  6 each Topical Q0600   Chlorhexidine  Gluconate Cloth  6 each Topical Q0600   collagenase    Topical Daily   darbepoetin (ARANESP ) injection - DIALYSIS  150 mcg Subcutaneous Q Tue-1800   hydrocerin   Topical BID   pantoprazole  (PROTONIX ) IV  40 mg Intravenous Q12H   polyethylene glycol  17 g Oral Daily   sodium chloride  flush  10-40 mL Intracatheter Q12H    Infusions:  DOBUTamine  5 mcg/kg/min (09/10/24 1614)    PRN Medications: HYDROmorphone  (DILAUDID ) injection, methocarbamol , mouth rinse, oxyCODONE , sodium chloride  flush  Assessment/Plan   Acute on chronic systolic heart failure: Primarily ischemic, prior history of infarcted RCA.  Multiple recent admission requiring milrinone . Now with progressive cardio-renal syndrome requiring HD.  - Most recent TTE 8/25 EF 35-40%, RV mildly reduced. TEE 9/25 with moderate MR - initially  started on milrinone  for co-ox 59, stopped d/t hypotension and renal function - restarted on milrinone  12/8 for again co-ox 48% - switched milrinone  to dobutamine  12/9 - Co-ox 69% on DBA 5  - Diuresed well w/ IV Lasix . CVP 10 today. Transition back to PO torsemide , 100 mg daily -- Plan to send home with dobutamine  if we can find an agency to accept his insurance. Social work still working on this.  -  nephrology feels that with inotrope he has a chance for renal recovery - GDMT limited by CKD Stage IV  Mitral regurgitation: 2/2 ischemic disease. TEE 9/25 with more moderate MR by PISA, valve area, systolic blunting of pulmonary veins. - Continue HF optimization per above    AKI on CKD 4: Long standing. Now with progressive cardio-renal disease. Nephrology feels that he has a change for  renal recovery with inotrope support. - Cr peaked 5 with anuria.  s/p HDC placement and had iHD. Last session 09/02/24. HD cath removed.  - Creatinine improving, 2.08 today  - Continue diuretics.  - Will continue to follow with Nephrology in outpatient setting   PAF - Previously diagnosed on device - Amio increased to 400 mg daily 12/16 d/t PVCs - off Eliquis  d/t severe anemia/suspected bleeding    Hx VT/VF - Has single chamber ICD - On amiodarone  200 mg daily - LFTS stable 08/25/24. TSH okay - Frequent PVCs on DBA. Improved with increasing amiodarone  to 400 mg daily   CAD: Single vessel RCA disease - No anginal pain - Continue statin. No aspirin  with need for anticoagulation   Abnormal Chest CT  - posterior right pulmonary nodule is new since prior study + s/o cirrhosis + hepatic lesions concerning for ? Metastatic dz, though liver cirrhosis may be 2/2 HF or amio. Recent LFTs stable.  - differ further w/u to IM/PCP    Acute on chronic anemia - hgb 8>6.3 - transfused 2 u RBCs (12/17). Hgb 9.9 today  - Now off eliquis , risk likely outweighs benefit at this point.   GOC -DNR/DNI -Not a candidate for advanced therapies.    Length of Stay: [address] PA-C  09/11/2024, 7:38 AM  Advanced Heart Failure Team Pager [phone] (M-F; 7a - 5p)  Please contact [facility] for night-coverage after hours (5p -7a ) and weekends on [email or web address]

## 2024-09-11 NOTE — TOC Progression Note (Addendum)
 Transition of Care (TOC) - Progression Note    Patient Details  Name: Dean Mareno Sr. MRN: [id number] Date of Birth: [birth year]  Transition of Care [facility]) CM/SW Contact  Justina Delcia Czar, RN Phone Number: [phone] 09/11/2024, 12:11 PM  Clinical Narrative:     HF CM received request from [facility] # [phone] to resend RX signed by provider, fax [phone]. Electronic signature was not acceptable. Faxed signed RX to [facility].   Contacted Jessica and left voicemail for return call. Pt scheduled dc on 09/12/2024.   Expected Discharge Plan: Home w Home Health Services Barriers to Discharge: Continued Medical Work up    Expected Discharge Plan and Services   Discharge Planning Services: CM Consult Post Acute Care Choice: Home Health Living arrangements for the past 2 months: Single Family Home   HH Arranged: RN, PT     Social Drivers of Health (SDOH) Interventions SDOH Screenings   Food Insecurity: Patient Declined (08/22/2024)  Housing: Unknown (08/22/2024)  Transportation Needs: Patient Declined (08/22/2024)  Utilities: Patient Declined (08/22/2024)  Social Connections: Unknown (08/22/2024)  Tobacco Use: Low Risk (08/22/2024)    Readmission Risk Interventions    05/09/2024    9:53 AM 04/22/2024   11:11 AM  Readmission Risk Prevention Plan  Transportation Screening Complete Complete  PCP or Specialist Appt within 5-7 Days  Complete  Home Care Screening  Complete  Medication Review (RN CM)  Complete  Medication Review (RN Care Manager) Complete   PCP or Specialist appointment within 3-5 days of discharge Complete   HRI or Home Care Consult Complete   SW Recovery Care/Counseling Consult Complete   Palliative Care Screening Not Applicable   Skilled Nursing Facility Not Applicable

## 2024-09-11 NOTE — Plan of Care (Signed)
   Problem: Education: Goal: Knowledge of General Education information will improve Description: Including pain rating scale, medication(s)/side effects and non-pharmacologic comfort measures Outcome: Progressing   Problem: Coping: Goal: Level of anxiety will decrease Outcome: Progressing

## 2024-09-11 NOTE — Progress Notes (Signed)
 [facility] Progress Note    Assessment/ Plan:   AKI on CKD4 -fluctuant baseline Cr but overall seems to be around 1.8-low to mid 2's -AKI likely secondary to cardiorenal (initially and now). Previously required dialysis during this admit. Last HD 12/9, TDC removed -Continue with dobuatmine -if no response to diuretics and remains volume overloaded then will need to consider reinitiating dialysis, we discussed this as being a possibility. Not the case currently -Cr down to 2.08 today. Remains nonoliguric. Continue with diuretics, transitioned to torsemide  now -Avoid nephrotoxic medications including NSAIDs and iodinated intravenous contrast exposure unless the latter is absolutely indicated.  Preferred narcotic agents for pain control are hydromorphone , fentanyl , and methadone. Morphine  should not be used. Avoid Baclofen and avoid oral sodium phosphate  and magnesium  citrate based laxatives / bowel preps. Continue strict Input and Output monitoring. Will monitor the patient closely with you and intervene or adjust therapy as indicated by changes in clinical status/labs   Acute on chronic systolic CHF/low output biventricular heart failure -agree with continuing diuretics: transition to torsemide  100mg  daily - On dobutamine  gtt -monitor strict I/O, daily weights -appreciate cardiology  Hyponatremia, mild -hypervolemic. Na stable 133 -monitor for now  HTN -BP stable  Anemia -receiving ESA -transfuse prn for Hgb <7 -Hgb 6.3->2u prbc->9.9 today  Acute T6 fracture -with brace, PT/OT. Per primary service   Subjective:   Patient seen and examined bedside. Feels better in regards to swelling. Denies any SOB, CP.   Objective:   BP (!) 131/55 (BP Location: Left Arm)   Pulse 69   Temp 97.7 F (36.5 C) (Oral)   Resp (!) 21   Ht 5' 6 (1.676 m)   Wt 88.9 kg   SpO2 100%   BMI 31.63 kg/m   Intake/Output Summary (Last 24 hours) at 09/11/2024 1329 Last data filed at  09/11/2024 1100 Gross per 24 hour  Intake 991.27 ml  Output 3850 ml  Net -2858.73 ml   Weight change:   Physical Exam: Gen: NAD, sitting up in bed CVS: RRR Resp:decreased breath sound right base otherwise CTA BL, no overt w/r/r/c appreciated, unlabored, normal wob Abd: soft nt/nd Ext: 1-2+ pitting edema b/l Les (softer) Neuro: awake, alert, no asterixis/myoclonic jerks observed  Imaging: DG CHEST PORT 1 VIEW Result Date: 09/10/2024 CLINICAL DATA:  Pleural effusion EXAM: PORTABLE CHEST 1 VIEW COMPARISON:  September 04, 2024 FINDINGS: Stable cardiomegaly. Stable single lead left-sided defibrillator. Interval placement of right internal jugular catheter with distal tip in expected position of SVC. Small right pleural effusion is noted with associated right basilar atelectasis or scarring. Bony thorax is unremarkable. IMPRESSION: Interval placement of right internal jugular catheter with distal tip in expected position of SVC. Small right pleural effusion is noted with associated right basilar atelectasis or scarring. Electronically Signed   By: Lynwood Landy Raddle M.D.   On: 09/10/2024 16:25    Labs: BMET Recent Labs  Lab 09/05/24 0500 09/05/24 0841 09/06/24 0526 09/07/24 1010 09/08/24 0500 09/08/24 1038 09/09/24 0501 09/10/24 0445 09/11/24 0426  NA 132* 132* 132* 132*  --  134* 134* 135 133*  K 3.6 3.6 4.4 4.4  --  4.0 4.4 3.9 3.7  CL 96* 96* 98 97*  --  100 99 98 95*  CO2 25 25 28 26   --  25 25 26 26   GLUCOSE 91 89 101* 115*  --  124* 91 106* 144*  BUN 26* 27* 30* 35*  --  39* 42* 42* 42*  CREATININE 1.92*  1.95* 2.30* 2.47* 2.30* 2.49* 2.57* 2.23* 2.08*  CALCIUM  9.4 9.4 10.0 9.4  --  9.4 10.2 10.4* 10.3  PHOS 2.2* 2.2*  --   --   --   --  3.4 3.0 3.0   CBC Recent Labs  Lab 09/06/24 0526 09/08/24 0500 09/09/24 0501 09/10/24 0826 09/11/24 0426  WBC 8.2  --  7.9 5.7 7.1  NEUTROABS  --   --   --  4.1  --   HGB 7.9* 7.5* 8.0* 6.3* 9.9*  HCT 24.1* 23.7* 25.5* 20.3* 30.0*   MCV 93.1  --  97.0 97.6 91.7  PLT 118*  --  115* 80* 106*    Medications:     acetaminophen   650 mg Oral Q6H   amiodarone   400 mg Oral Daily   atorvastatin   40 mg Oral Daily   Chlorhexidine  Gluconate Cloth  6 each Topical Daily   collagenase    Topical Daily   darbepoetin (ARANESP ) injection - DIALYSIS  150 mcg Subcutaneous Q Tue-1800   hydrocerin   Topical BID   pantoprazole  (PROTONIX ) IV  40 mg Intravenous Q12H   polyethylene glycol  17 g Oral Daily   sodium chloride  flush  10-40 mL Intracatheter Q12H   torsemide   100 mg Oral Daily      Ephriam Stank, MD [facility] 09/11/2024, 1:29 PM

## 2024-09-11 NOTE — Progress Notes (Signed)
 Report given to [facility] RN. Patient's belongings accounted for and returned to patient. Patient taken to 3E-15 in bed.

## 2024-09-12 ENCOUNTER — Inpatient Hospital Stay (HOSPITAL_COMMUNITY): Admitting: Cardiology

## 2024-09-12 DIAGNOSIS — T07XXXA Unspecified multiple injuries, initial encounter: Secondary | ICD-10-CM | POA: Diagnosis not present

## 2024-09-12 LAB — COOXEMETRY PANEL
Carboxyhemoglobin: 2.5 % — ABNORMAL HIGH (ref 0.5–1.5)
Methemoglobin: <0.7 % (ref 0.0–1.5)
O2 Saturation: 74.8 %
Total hemoglobin: 10.8 g/dL — ABNORMAL LOW (ref 12.0–16.0)

## 2024-09-12 LAB — RENAL FUNCTION PANEL
Albumin: 3.9 g/dL (ref 3.5–5.0)
Anion gap: 11 (ref 5–15)
BUN: 43 mg/dL — ABNORMAL HIGH (ref 8–23)
CO2: 27 mmol/L (ref 22–32)
Calcium: 10.4 mg/dL — ABNORMAL HIGH (ref 8.9–10.3)
Chloride: 95 mmol/L — ABNORMAL LOW (ref 98–111)
Creatinine, Ser: 2.29 mg/dL — ABNORMAL HIGH (ref 0.61–1.24)
GFR, Estimated: 29 mL/min — ABNORMAL LOW
Glucose, Bld: 124 mg/dL — ABNORMAL HIGH (ref 70–99)
Phosphorus: 2.8 mg/dL (ref 2.5–4.6)
Potassium: 3.9 mmol/L (ref 3.5–5.1)
Sodium: 133 mmol/L — ABNORMAL LOW (ref 135–145)

## 2024-09-12 MED ORDER — DOBUTAMINE-DEXTROSE 4-5 MG/ML-% IV SOLN: 5.0000 ug/kg/min | INTRAVENOUS | 0 refills | Status: DC

## 2024-09-12 NOTE — Progress Notes (Signed)
 " PROGRESS NOTE [name]  FMW:[id number] DOB: May 27, 1951 DOA: 08/22/2024 PCP: [name], MD   Brief Narrative: [name] Is a 73 yrs old male with PMH significant for CAD, PAF, ICM, cardiac arrest s/p AICD, HFrEF (EF 35-40% in 04/2024), hypertension, hyperlipidemia, type 2 diabetes, obstructive sleep apnea and recent admission on 07/16/24 for HFrEF exacerbation presented to hospital with back pain after falling from the wheelchair.  He was noted to have acute kidney injury and T6 fracture with lower extremity edema.  Patient's hospital stay was complicated with heart failure with worsening renal functions and cardiology and nephrology were consulted.  Patient had hemodialysis initiated for volume management and underwent thoracocentesis on 11-28 and 12-5 for worsening right pleural effusion.  Also required IV iron .     At this time, patient is being followed by Cardiology and Nephrology and is on dobutamine  drip. He underwent hemodialysis for volume management, No more HD. TDC removed. Currently medically stable for discharge but dispo pending insurance approval for home infusions.   Assessment & Plan:   Principal Problem:   Multiple traumatic injuries Active Problems:   Cardiorenal syndrome with renal failure   Cardiorenal syndrome  Cardiorenal syndrome with renal failure: Low output biventricular heart failure: Cardiology following.  Last 2D echo showed  EF of 35 to 40%.  Cardiac catheterization 06/19/24 showed moderate pulmonary hypertension and low cardiac output.  Patient was initially on milrinone  drip with cardiology which was subsequently discontinued but at this time has been started on dobutamine  gtt. Continue strict intake and output charting. Denies any chest pain, ambulating much better with physical therapy. Continue dobutamine  infusion, Continue IV Lasix  120 mg every 12 hours,  metolazone  5 mg x 1 today Status post tunneled cath placement for home inotropic  therapy. TOC working on finding a agency for home infusion therapy.  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  No need for further dialysis.  TDC catheter removed 12/11. Patient follows up with Dr. [name] as outpatient. Cr today is 2.29 - continue to monitor and follow up outpatient    Acute on chronic anemia: Likely from CKD and iron  deficiency.  Received IV iron .  Hb dropped from 8.0 > 6.9 ,No obvious visible bleeding. Hgb is stable s/p transfusing 2 units.  Hold Eliquis . Possible restart tomorrow if remains stable   Acute T6 fracture, physical deconditioning: Neurosurgery was consulted.  Recommended TSLO brace and outpatient follow-up with neurosurgery Dr. [name].   Continue PT, OT while in the hospital.   R pleural effusion: RML/RLL consolidation Patient underwent IR guided thoracentesis on 11/28 with removal of 500 mL of bloody fluid.   Patient also had repeat thoracentesis on 12/5, with removal of 1.5L of bloody fluid.   Currently on room air and remained stable.   Hyperkalemia : Resolved    Liver enhancement on imaging: Could be secondary to amiodarone .   PAF: Continue amiodarone  200mg  daily.   Patient was on Eliquis  5mg  BID (currently held) due to anemia. Eliquis  held again due to anemia  DVT prophylaxis:SCDs Code Status: DNR Family Communication: no family at bed side. Disposition Plan:  Remains inpatient appropriate because: insurance company approval of home treatments needed  TOC is working on finding the agency for home inotropic therapy.  Consultants:  Cardiology Nephrology  Procedures:  Antimicrobials: Anti-infectives (From admission, onward)    Start     Dose/Rate Route Frequency Ordered Stop   08/28/24 1345  ceFAZolin  (ANCEF ) IVPB 2g/100 mL premix  2 g 200 mL/hr over 30 Minutes Intravenous To Radiology 08/28/24 1247 08/28/24 1545      Subjective: Patient reports feeling well today. He states that this is  the skinniest he's seen his ankles in years  Objective: Vitals:   09/11/24 2317 09/12/24 0354 09/12/24 0500 09/12/24 0720  BP: (!) 136/54 (!) 134/45  (!) 142/48  Pulse: 74 72  73  Resp: 19 19  20   Temp: 98.1 F (36.7 C) 98.1 F (36.7 C)  97.7 F (36.5 C)  TempSrc: Oral Oral  Oral  SpO2: 97% 99%  95%  Weight:   83.7 kg   Height:        Intake/Output Summary (Last 24 hours) at 09/12/2024 0723 Last data filed at 09/12/2024 0400 Gross per 24 hour  Intake 480.51 ml  Output 2030 ml  Net -1549.49 ml   Filed Weights   09/09/24 0500 09/10/24 0500 09/12/24 0500  Weight: 88.8 kg 88.9 kg 83.7 kg   Examination:  General exam: Appears calm and comfortable, not in any acute distress. Respiratory system: CTA Bilaterally. Respiratory effort normal. RR 16 Cardiovascular system: S1 & S2 heard, RRR. No JVD, murmurs, rubs, gallops or clicks.  Central nervous system: Alert and oriented x 3. No focal neurological deficits. Extremities: ankle edema L>R, no cyanosis, no clubbing. Skin: No rashes, lesions or ulcers Psychiatry: Judgement and insight appear normal. Mood & affect appropriate.   Data Reviewed: I have personally reviewed following labs and imaging studies  CBC: Recent Labs  Lab 09/05/24 0841 09/06/24 0526 09/08/24 0500 09/09/24 0501 09/10/24 0826 09/11/24 0426  WBC 8.2 8.2  --  7.9 5.7 7.1  NEUTROABS  --   --   --   --  4.1  --   HGB 7.6* 7.9* 7.5* 8.0* 6.3* 9.9*  HCT 24.0* 24.1* 23.7* 25.5* 20.3* 30.0*  MCV 93.4 93.1  --  97.0 97.6 91.7  PLT 144* 118*  --  115* 80* 106*   Basic Metabolic Panel: Recent Labs  Lab 09/05/24 0841 09/06/24 0526 09/07/24 1010 09/08/24 0500 09/08/24 1038 09/09/24 0501 09/10/24 0445 09/11/24 0426  NA 132* 132* 132*  --  134* 134* 135 133*  K 3.6 4.4 4.4  --  4.0 4.4 3.9 3.7  CL 96* 98 97*  --  100 99 98 95*  CO2 25 28 26   --  25 25 26 26   GLUCOSE 89 101* 115*  --  124* 91 106* 144*  BUN 27* 30* 35*  --  39* 42* 42* 42*  CREATININE  1.95* 2.30* 2.47* 2.30* 2.49* 2.57* 2.23* 2.08*  CALCIUM  9.4 10.0 9.4  --  9.4 10.2 10.4* 10.3  MG  --  2.0  --   --   --  2.0  --  2.0  PHOS 2.2*  --   --   --   --  3.4 3.0 3.0    LOS: 21 days    Time spent: 50 mins  Marien LITTIE Piety, MD [facility]   If 7PM-7AM, please contact night-coverage  "

## 2024-09-12 NOTE — Progress Notes (Addendum)
 Physical Therapy Treatment Patient Details Name: Dean [name] Sr. MRN: [id number] DOB: 1950-11-28 Today's Date: 09/12/2024   History of Present Illness 73 yo male adm 08/22/24 s/p fall down 8 stairs in wheelchair. CT (+) T6 vertebral body fx, non-op in TLSO. 11/28, 12/5 thoracentesis. 12/4 TDC placed and iHD 12/4-12/9. Course complicated by CHF and cardiorenal syndrome. PMHx: AICD, arthritis, HLD, HFrEF, ICD, PAF on Eliquis , HTN, CAD, HLD, OSA on cpap, T2DM, CKD, HTN, laminectomy, cardiac arrest.    PT Comments  Pt received in supine and agreeable to PT session. Pt able to recall 3/3 back precautions with good adherence during session. Pt required supervision/CGA for bed mobility. Limited PT session as the TLSO that arrived to pt's room after transferring floors was a small and did not fit. Pt was agreeable to perform LE exercises for strengthening instead. Continue to recommend home with HHPT with acute PT to follow.  After session, reached out to ortho geographical information systems officer with pt able to get large TLSO. Brace in room and ready for use.    If plan is discharge home, recommend the following: A little help with walking and/or transfers;A little help with bathing/dressing/bathroom;Assistance with cooking/housework;Help with stairs or ramp for entrance   Can travel by private vehicle      Yes  Equipment Recommendations  Rolling walker (2 wheels)       Precautions / Restrictions Precautions Precautions: Fall;Back Recall of Precautions/Restrictions: Intact Required Braces or Orthoses: Spinal Brace Spinal Brace: Thoracolumbosacral orthotic;Applied in sitting position     Mobility  Bed Mobility Overal bed mobility: Needs Assistance Bed Mobility: Rolling, Sidelying to Sit, Sit to Sidelying Rolling: Supervision, Used rails Sidelying to sit: Supervision    Sit to sidelying: Contact guard assist General bed mobility comments: Supervision for safety with no physical assist. Able to recall log  roll technique    Transfers  General transfer comment: deferred transfers due to incorrect fit of TLSO (small in room when pt needs a large).     Balance Overall balance assessment: Needs assistance Sitting-balance support: Feet supported Sitting balance-Leahy Scale: Good     Communication Communication Communication: No apparent difficulties  Cognition Arousal: Alert Behavior During Therapy: WFL for tasks assessed/performed   PT - Cognitive impairments: No apparent impairments    Following commands: Intact Following commands impaired: Follows one step commands with increased time    Cueing Cueing Techniques: Verbal cues, Gestural cues  Exercises General Exercises - Lower Extremity Ankle Circles/Pumps: AROM, Both, 10 reps, Supine Long Arc Quad: AROM, Both, 10 reps, Seated Straight Leg Raises: AROM, Both, 10 reps, Supine Hip Flexion/Marching: AROM, Both, 5 reps, Seated        Pertinent Vitals/Pain Pain Assessment Pain Assessment: No/denies pain     PT Goals (current goals can now be found in the care plan section) Progress towards PT goals: Progressing toward goals    Frequency    Min 2X/week       AM-PAC PT 6 Clicks Mobility   Outcome Measure  Help needed turning from your back to your side while in a flat bed without using bedrails?: A Little Help needed moving from lying on your back to sitting on the side of a flat bed without using bedrails?: A Little Help needed moving to and from a bed to a chair (including a wheelchair)?: A Little Help needed standing up from a chair using your arms (e.g., wheelchair or bedside chair)?: A Little Help needed to walk in hospital room?: A Little Help needed climbing  3-5 steps with a railing? : A Little 6 Click Score: 18    End of Session   Activity Tolerance: Other (comment) (limited by not having correct back brace available) Patient left: in bed;with call bell/phone within reach Nurse Communication: Mobility  status;Other (comment) (back brace issues) PT Visit Diagnosis: Other abnormalities of gait and mobility (R26.89);Muscle weakness (generalized) (M62.81)     Time: 8568-8544 PT Time Calculation (min) (ACUTE ONLY): 24 min  Charges:    $Therapeutic Exercise: 8-22 mins $Therapeutic Activity: 8-22 mins PT General Charges $$ ACUTE PT VISIT: 1 Visit                    Kate ORN, PT, DPT Secure Chat Preferred  Rehab Office [phone]   Kate BRAVO Wendolyn 09/12/2024, 4:30 PM

## 2024-09-12 NOTE — Progress Notes (Signed)
" ° °  Discussed TOC CM. Currently no agency will take his home dobutamine .   Will need to try and wean Dobutamine . Discussed with Dr [name]. Cut back Dobutamine  to 2.5 mcg. May require higher dose of diuretics.   Harout Scheurich NP-C  4:51 PM    "

## 2024-09-12 NOTE — TOC Progression Note (Addendum)
 Transition of Care (TOC) - Progression Note    Patient Details  Name: Dean Beitler Sr. MRN: [id number] Date of Birth: May 09, 1951  Transition of Care [facility]) CM/SW Contact  Justina Delcia Czar, RN Phone Number: [phone] 09/12/2024, 9:22 AM  Clinical Narrative:    Patient scheduled for dc today. Contacted [facility] and left message for Harlene and Katrina for return call on arrangements for Home Health with Home Dobutamine .   Katrina # [phone] x [phone], Harlene ext [phone]  Spoke to pt's dtr and states pt has Rollator at home. States he would benefit from RW. Declines 3n1 bedside commode or hospital bed. States she may decide later. Explained PCP can assist with getting DME orders for hospital bed in future.  Explained to dtr the difficulties with getting HH/Home Infusion arranged with his plan. States next year she will try for a different plan.   Contacted [employer] for [employer] for home.     Expected Discharge Plan: Home w Home Health Services Barriers to Discharge: Continued Medical Work up    Expected Discharge Plan and Services   Discharge Planning Services: CM Consult Post Acute Care Choice: Home Health Living arrangements for the past 2 months: Single Family Home                   HH Arranged: RN, PT      Social Drivers of Health (SDOH) Interventions SDOH Screenings   Food Insecurity: Patient Declined (08/22/2024)  Housing: Unknown (08/22/2024)  Transportation Needs: Patient Declined (08/22/2024)  Utilities: Patient Declined (08/22/2024)  Social Connections: Unknown (08/22/2024)  Tobacco Use: Low Risk (08/22/2024)    Readmission Risk Interventions    05/09/2024    9:53 AM 04/22/2024   11:11 AM  Readmission Risk Prevention Plan  Transportation Screening Complete Complete  PCP or Specialist Appt within 5-7 Days  Complete  Home Care Screening  Complete  Medication Review (RN CM)  Complete  Medication Review (RN Care Manager)  Complete   PCP or Specialist appointment within 3-5 days of discharge Complete   HRI or Home Care Consult Complete   SW Recovery Care/Counseling Consult Complete   Palliative Care Screening Not Applicable   Skilled Nursing Facility Not Applicable

## 2024-09-12 NOTE — Progress Notes (Signed)
 Occupational Therapy Treatment Patient Details Name: Dean Gorter Sr. MRN: [id number] DOB: 1951-05-02 Today's Date: 09/12/2024   History of present illness 73 yo male adm 08/22/24 s/p fall down 8 stairs in wheelchair. CT (+) T6 vertebral body fx, non-op in TLSO. 11/28, 12/5 thoracentesis. 12/4 TDC placed and iHD 12/4-12/9. Course complicated by CHF and cardiorenal syndrome. PMHx: AICD, arthritis, HLD, HFrEF, ICD, PAF on Eliquis , HTN, CAD, HLD, OSA on cpap, T2DM, CKD, HTN, laminectomy, cardiac arrest.   OT comments  Pt. Seen for skilled OT treatment.  Pt. Able to complete bed mobility with intermittent cues for log roll tech.  Pt. Able to transition from sidelying to sit with S and use of rails to maintain no twisting.  Sidelying to sit with S.  Pt. Able to scoot x3 in prep for back to bed and into sidelying without physical assistance. Mobility deferred secondary to back brace not present in room.  Rn aware and working on obtaining it.        If plan is discharge home, recommend the following:  A little help with walking and/or transfers;A lot of help with bathing/dressing/bathroom;Assistance with cooking/housework;Direct supervision/assist for medications management;Assist for transportation;Help with stairs or ramp for entrance   Equipment Recommendations       Recommendations for Other Services      Precautions / Restrictions Precautions Precautions: Fall;Back Recall of Precautions/Restrictions: Impaired Precaution/Restrictions Comments: pt states precautions but requires cues to maintain Required Braces or Orthoses: Spinal Brace Spinal Brace: Thoracolumbosacral orthotic;Applied in sitting position Restrictions Other Position/Activity Restrictions: brace is missing after his recent transfer, rn aware and working on getting it for pt.       Mobility Bed Mobility Overal bed mobility: Needs Assistance Bed Mobility: Rolling, Sidelying to Sit, Sit to Sidelying Rolling:  Supervision, Used rails Sidelying to sit: Supervision     Sit to sidelying: Contact guard assist General bed mobility comments: pt. reports BLEs are easier to manage with fluid reduction, did not require physical assistance with management of BLEs into bed.  requests to stay in sidelying    Transfers                   General transfer comment: deferred transfers/mobility secondary to pt. did not have his back brace     Balance                                           ADL either performed or assessed with clinical judgement   ADL Overall ADL's : Needs assistance/impaired                                       General ADL Comments: session limited to bed mobility with focus on integration of spinal precautions w/o cues    Extremity/Trunk Assessment              Vision       Perception     Praxis     Communication     Cognition Arousal: Alert Behavior During Therapy: WFL for tasks assessed/performed Cognition: No apparent impairments                               Following commands: Intact Following commands impaired: Follows one step  commands with increased time      Cueing   Cueing Techniques: Verbal cues, Gestural cues  Exercises      Shoulder Instructions       General Comments      Pertinent Vitals/ Pain       Pain Assessment Pain Assessment: No/denies pain  Home Living                                          Prior Functioning/Environment              Frequency  Min 2X/week        Progress Toward Goals  OT Goals(current goals can now be found in the care plan section)  Progress towards OT goals: Progressing toward goals  ADL Goals Pt Will Perform Lower Body Dressing: with modified independence;sit to/from stand;with adaptive equipment Pt Will Transfer to Toilet: with modified independence;ambulating;regular height toilet Additional ADL Goal #1: Pt will  indep follow spinal precautions during ADLs  Plan      Co-evaluation                 AM-PAC OT 6 Clicks Daily Activity     Outcome Measure   Help from another person eating meals?: None Help from another person taking care of personal grooming?: A Little Help from another person toileting, which includes using toliet, bedpan, or urinal?: A Little Help from another person bathing (including washing, rinsing, drying)?: A Lot Help from another person to put on and taking off regular upper body clothing?: A Little Help from another person to put on and taking off regular lower body clothing?: A Lot 6 Click Score: 17    End of Session    OT Visit Diagnosis: Unsteadiness on feet (R26.81);Other abnormalities of gait and mobility (R26.89);Muscle weakness (generalized) (M62.81);History of falling (Z91.81);Pain   Activity Tolerance Patient tolerated treatment well   Patient Left in bed;with call bell/phone within reach;with bed alarm set   Nurse Communication Mobility status;Other (comment) (rn states ok to work with pt., notified rn brace not in room from recent transfer from previous unit. rn actively working on getting brace for pt.)        Time: 0910-0928 OT Time Calculation (min): 18 min  Charges: OT General Charges $OT Visit: 1 Visit OT Treatments $Self Care/Home Management : 8-22 mins  Randall, COTA/L Acute Rehabilitation [phone]   CHRISTELLA Nest Lorraine-COTA/L  09/12/2024, 9:45 AM

## 2024-09-12 NOTE — Progress Notes (Signed)
 " [facility] Progress Note    Assessment/ Plan:   AKI on CKD4 -fluctuant baseline Cr but overall seems to be around 1.8-low to mid 2's -AKI likely secondary to cardiorenal (initially and now). Previously required dialysis during this admit. Last HD 12/9, TDC removed -Continue with dobuatmine -if no response to diuretics and remains volume overloaded then will need to consider reinitiating dialysis, we discussed this as being a possibility. Not the case currently -Cr stable/within baseline range. Remains nonoliguric. Continue with diuretics -Avoid nephrotoxic medications including NSAIDs and iodinated intravenous contrast exposure unless the latter is absolutely indicated.  Preferred narcotic agents for pain control are hydromorphone , fentanyl , and methadone. Morphine  should not be used. Avoid Baclofen and avoid oral sodium phosphate  and magnesium  citrate based laxatives / bowel preps. Continue strict Input and Output monitoring. Will monitor the patient closely with you and intervene or adjust therapy as indicated by changes in clinical status/labs   Acute on chronic systolic CHF/low output biventricular heart failure -c/w torsemide  100mg  daily. Can utilize metolazone  as needed - On dobutamine  gtt -monitor strict I/O, daily weights -appreciate cardiology  Hyponatremia, mild -hypervolemic. Na stable 133 -monitor for now  HTN -BP stable  Anemia -receiving ESA -transfuse prn for Hgb <7 -received 2u prnc 12/17 for Hgb 6.3. Up to 9.9 yesterday  Acute T6 fracture -PT/OT. Per primary service  Nothing else to add at this time. Will sign off. Will need to follow up with Dr. [name] shortly after discharge. Please call with any questions/concerns in the interim. Discussed with AHF.   Subjective:   Patient seen and examined bedside. Weight down to 83.7kg today. He reports that he continues to feel better, swelling drastically improved. Uop ~2L   Objective:   BP (!)  142/48 (BP Location: Right Arm)   Pulse 73   Temp 97.7 F (36.5 C) (Oral)   Resp 20   Ht 5' 6 (1.676 m)   Wt 83.7 kg   SpO2 95%   BMI 29.78 kg/m   Intake/Output Summary (Last 24 hours) at 09/12/2024 9061 Last data filed at 09/12/2024 0847 Gross per 24 hour  Intake 840.51 ml  Output 2110 ml  Net -1269.49 ml   Weight change:   Physical Exam: Gen: NAD CVS: RRR Resp: unlabored, normal wob Abd: soft nt/nd Ext: trace to 1+ edema b/l LEs Neuro: awake, alert, no asterixis/myoclonic jerks observed  Imaging: DG CHEST PORT 1 VIEW Result Date: 09/10/2024 CLINICAL DATA:  Pleural effusion EXAM: PORTABLE CHEST 1 VIEW COMPARISON:  September 04, 2024 FINDINGS: Stable cardiomegaly. Stable single lead left-sided defibrillator. Interval placement of right internal jugular catheter with distal tip in expected position of SVC. Small right pleural effusion is noted with associated right basilar atelectasis or scarring. Bony thorax is unremarkable. IMPRESSION: Interval placement of right internal jugular catheter with distal tip in expected position of SVC. Small right pleural effusion is noted with associated right basilar atelectasis or scarring. Electronically Signed   By: Lynwood Landy Raddle M.D.   On: 09/10/2024 16:25    Labs: BMET Recent Labs  Lab 09/06/24 0526 09/07/24 1010 09/08/24 0500 09/08/24 1038 09/09/24 0501 09/10/24 0445 09/11/24 0426 09/12/24 0500  NA 132* 132*  --  134* 134* 135 133* 133*  K 4.4 4.4  --  4.0 4.4 3.9 3.7 3.9  CL 98 97*  --  100 99 98 95* 95*  CO2 28 26  --  25 25 26 26 27   GLUCOSE 101* 115*  --  124* 91  106* 144* 124*  BUN 30* 35*  --  39* 42* 42* 42* 43*  CREATININE 2.30* 2.47* 2.30* 2.49* 2.57* 2.23* 2.08* 2.29*  CALCIUM  10.0 9.4  --  9.4 10.2 10.4* 10.3 10.4*  PHOS  --   --   --   --  3.4 3.0 3.0 2.8   CBC Recent Labs  Lab 09/06/24 0526 09/08/24 0500 09/09/24 0501 09/10/24 0826 09/11/24 0426  WBC 8.2  --  7.9 5.7 7.1  NEUTROABS  --   --   --   4.1  --   HGB 7.9* 7.5* 8.0* 6.3* 9.9*  HCT 24.1* 23.7* 25.5* 20.3* 30.0*  MCV 93.1  --  97.0 97.6 91.7  PLT 118*  --  115* 80* 106*    Medications:     acetaminophen   650 mg Oral Q6H   amiodarone   400 mg Oral Daily   atorvastatin   40 mg Oral Daily   Chlorhexidine  Gluconate Cloth  6 each Topical Daily   collagenase    Topical Daily   darbepoetin (ARANESP ) injection - DIALYSIS  150 mcg Subcutaneous Q Tue-1800   hydrocerin   Topical BID   pantoprazole  (PROTONIX ) IV  40 mg Intravenous Q12H   polyethylene glycol  17 g Oral Daily   sodium chloride  flush  10-40 mL Intracatheter Q12H   torsemide   100 mg Oral Daily      Ephriam Stank, MD [facility] 09/12/2024, 9:38 AM   "

## 2024-09-12 NOTE — TOC Progression Note (Addendum)
 Transition of Care (TOC) - Progression Note    Patient Details  Name: Dean Hoffmann Sr. MRN: [id number] Date of Birth: 1951-08-24  Transition of Care [facility]) CM/SW Contact  Justina Delcia Czar, RN Phone Number: [phone] 09/12/2024, 2:24 PM  Clinical Narrative:     Spoke to [facility] rep, Harlene and that are unable to locate a [facility] agency that will accept referral for Home Dobutamine .   HF CM updated team.   Contacted [facility] rep, Artavia and states she will see if they received a referral from [facility].   Contacted [employer] rep, Arna and states they are network with his plan.   3:18 pm Spoke to [employer]. Requested call from managerial team.   Received call back from [facility] rep, Artavia they have reconsidered for [facility] and accepted referral.   09/05/2024 1119 am        [facility])  Declined Payor requests at capacity -- [address] [place] [address], [place] [address] [phone] --  [facility])  Declined Insurance not in network -- [address] [place] [address] [phone] --  [facility])  Declined Cost of care -- [address] [place] [address] [phone]    4:30 pm Received call from [employer] rep, Baker and states they will work with [employer] for Home In[facility]. Explained [employer] rep, Pam states they are not able to accept referral. [facility] will discuss with [facility] rep.    Spoke to pt at bedside. Updated pt that he received a Rollator in this year. [employer] unable to provide RW. Will be an out of pocket expense.    Expected Discharge Plan: Home w Home Health Services Barriers to Discharge: Continued Medical Work up    Expected Discharge Plan and Services   Discharge Planning Services: CM Consult Post Acute Care Choice: Home Health Living arrangements for the  past 2 months: Single Family Home                           HH Arranged: RN, PT           Social Drivers of Health (SDOH) Interventions SDOH Screenings   Food Insecurity: Patient Declined (08/22/2024)  Housing: Unknown (08/22/2024)  Transportation Needs: Patient Declined (08/22/2024)  Utilities: Patient Declined (08/22/2024)  Social Connections: Unknown (08/22/2024)  Tobacco Use: Low Risk (08/22/2024)    Readmission Risk Interventions    05/09/2024    9:53 AM 04/22/2024   11:11 AM  Readmission Risk Prevention Plan  Transportation Screening Complete Complete  PCP or Specialist Appt within 5-7 Days  Complete  Home Care Screening  Complete  Medication Review (RN CM)  Complete  Medication Review (RN Care Manager) Complete   PCP or Specialist appointment within 3-5 days of discharge Complete   HRI or Home Care Consult Complete   SW Recovery Care/Counseling Consult Complete   Palliative Care Screening Not Applicable   Skilled Nursing Facility Not Applicable

## 2024-09-12 NOTE — Progress Notes (Addendum)
 "    Advanced Heart Failure Rounding Note  Cardiologist: Vinie JAYSON Maxcy, MD  HF Cardiologist: Dr. [name]   Chief Complaint: Acute on chronic HFrEF  Subjective:    Co-ox 75%  on 5 mcg of DBA.   Creatinine 2.1>2.3  Feels much better.    Objective:    Weight Range: 83.7 kg Body mass index is 29.78 kg/m.   Vital Signs:   Temp:  [97.7 F (36.5 C)-98.1 F (36.7 C)] 98.1 F (36.7 C) (12/19 0354) Pulse Rate:  [42-74] 72 (12/19 0354) Resp:  [19-21] 19 (12/19 0354) BP: (113-157)/(44-56) 134/45 (12/19 0354) SpO2:  [95 %-100 %] 99 % (12/19 0354) Weight:  [83.7 kg] 83.7 kg (12/19 0500) Last BM Date : 09/11/24  Weight change: Filed Weights   09/09/24 0500 09/10/24 0500 09/12/24 0500  Weight: 88.8 kg 88.9 kg 83.7 kg   Intake/Output:  Intake/Output Summary (Last 24 hours) at 09/12/2024 0654 Last data filed at 09/12/2024 0400 Gross per 24 hour  Intake 480.51 ml  Output 2030 ml  Net -1549.49 ml  CVP 12-13    Physical Exam  General:   No resp difficulty Neck: JVP 11-12  Cor: Regular rate & rhythm.  Lungs: clear Abdomen: soft, nontender, nondistended.  Extremities: R and LLE  trace-1+ edema Neuro: alert & oriented x3 Telemetry   SR 70s   Labs    CBC Recent Labs    09/10/24 0826 09/11/24 0426  WBC 5.7 7.1  NEUTROABS 4.1  --   HGB 6.3* 9.9*  HCT 20.3* 30.0*  MCV 97.6 91.7  PLT 80* 106*   Basic Metabolic Panel Recent Labs    87/82/74 0445 09/11/24 0426  NA 135 133*  K 3.9 3.7  CL 98 95*  CO2 26 26  GLUCOSE 106* 144*  BUN 42* 42*  CREATININE 2.23* 2.08*  CALCIUM  10.4* 10.3  MG  --  2.0  PHOS 3.0 3.0   Liver Function Tests Recent Labs    09/10/24 0445 09/11/24 0426  ALBUMIN  3.8 4.0    BNP (last 3 results) Recent Labs    06/25/24 1604 07/10/24 0227 08/22/24 0903  BNP 1,585.3* 1,952.1* 525.0*   Medications:    Scheduled Medications:  acetaminophen   650 mg Oral Q6H   amiodarone   400 mg Oral Daily   atorvastatin   40 mg Oral Daily    Chlorhexidine  Gluconate Cloth  6 each Topical Daily   collagenase    Topical Daily   darbepoetin (ARANESP ) injection - DIALYSIS  150 mcg Subcutaneous Q Tue-1800   hydrocerin   Topical BID   pantoprazole  (PROTONIX ) IV  40 mg Intravenous Q12H   polyethylene glycol  17 g Oral Daily   sodium chloride  flush  10-40 mL Intracatheter Q12H   torsemide   100 mg Oral Daily    Infusions:  DOBUTamine  5 mcg/kg/min (09/12/24 0400)    PRN Medications: HYDROmorphone  (DILAUDID ) injection, methocarbamol , mouth rinse, oxyCODONE , sodium chloride  flush  Assessment/Plan   Acute on chronic systolic heart failure: Primarily ischemic, prior history of infarcted RCA.  Multiple recent admission requiring milrinone . Now with progressive cardio-renal syndrome requiring HD.  - Most recent TTE 8/25 EF 35-40%, RV mildly reduced. TEE 9/25 with moderate MR - initially started on milrinone  for co-ox 59, stopped d/t hypotension and renal function - restarted on milrinone  12/8 for again co-ox 48% - switched milrinone  to dobutamine  12/9 - CO-OX remains stable on DBA 5  - CVP 12-13. Volume status looks much better. Continue  PO torsemide , 100 mg daily --  Plan to send home with dobutamine  if we can find an agency to accept his insurance. Social work still working on this.  -  nephrology feels that with inotrope he has a chance for renal recovery - GDMT limited by CKD Stage IV - Add ted hose.   Mitral regurgitation: 2/2 ischemic disease. TEE 9/25 with more moderate MR by PISA, valve area, systolic blunting of pulmonary veins. - Continue HF optimization per above    AKI on CKD 4: Long standing. Now with progressive cardio-renal disease. Nephrology feels that he has a change for renal recovery with inotrope support. - Cr peaked 5 with anuria.  s/p HDC placement and had iHD. Last session 09/02/24. HD cath removed.  - Creatinine 2.1>2.3 , acceptable.  - Continue diuretics.  - Will continue to follow with Nephrology in  outpatient setting   PAF - Previously diagnosed on device - Amio increased to 400 mg daily 12/16 d/t PVCs -Maintaining SR.  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 TSH okay - Frequent PVCs on DBA. Improved with increasing amiodarone  to 400 mg daily   CAD: Single vessel RCA disease - No anginal pain - Continue statin. No aspirin  with need for anticoagulation   Abnormal Chest CT  - posterior right pulmonary nodule is new since prior study + s/o cirrhosis + hepatic lesions concerning for ? Metastatic dz, though liver cirrhosis may be 2/2 HF or amio. Recent LFTs stable.  - differ further w/u to IM/PCP    Acute on chronic anemia - hgb 8>6.3 - transfused 2 u RBCs (12/17). Most recent Hgb 9.9.  - Check CBC in am.  - Now off eliquis , risk likely outweighs benefit at this point.   GOC -DNR/DNI -Not a candidate for advanced therapies.  Will follow with TOC about home DBA.   HF Meds for D/C   DBA 5 mcg  Torsemide  100 mg daily   Metolazone  5 mg  every Monday + 40 meq Potassium  Amiodarone  400 mg daily  Eliquis  5 mg twice a day   HF follow up has been set up   Length of Stay: 21  Leonila Speranza, NP  09/12/2024, 6:54 AM  Advanced Heart Failure Team Pager [phone] (M-F; 7a - 5p)  Please contact [facility] for night-coverage after hours (5p -7a ) and weekends on [email or web address]     "

## 2024-09-13 DIAGNOSIS — T07XXXA Unspecified multiple injuries, initial encounter: Secondary | ICD-10-CM | POA: Diagnosis not present

## 2024-09-13 LAB — CBC
HCT: 29.9 % — ABNORMAL LOW (ref 39.0–52.0)
Hemoglobin: 9.6 g/dL — ABNORMAL LOW (ref 13.0–17.0)
MCH: 30.2 pg (ref 26.0–34.0)
MCHC: 32.1 g/dL (ref 30.0–36.0)
MCV: 94 fL (ref 80.0–100.0)
Platelets: 111 K/uL — ABNORMAL LOW (ref 150–400)
RBC: 3.18 MIL/uL — ABNORMAL LOW (ref 4.22–5.81)
RDW: 17.5 % — ABNORMAL HIGH (ref 11.5–15.5)
WBC: 7.1 K/uL (ref 4.0–10.5)
nRBC: 0 % (ref 0.0–0.2)

## 2024-09-13 LAB — RENAL FUNCTION PANEL
Albumin: 3.8 g/dL (ref 3.5–5.0)
Anion gap: 11 (ref 5–15)
BUN: 46 mg/dL — ABNORMAL HIGH (ref 8–23)
CO2: 28 mmol/L (ref 22–32)
Calcium: 10.5 mg/dL — ABNORMAL HIGH (ref 8.9–10.3)
Chloride: 95 mmol/L — ABNORMAL LOW (ref 98–111)
Creatinine, Ser: 2.38 mg/dL — ABNORMAL HIGH (ref 0.61–1.24)
GFR, Estimated: 28 mL/min — ABNORMAL LOW
Glucose, Bld: 112 mg/dL — ABNORMAL HIGH (ref 70–99)
Phosphorus: 2.7 mg/dL (ref 2.5–4.6)
Potassium: 4 mmol/L (ref 3.5–5.1)
Sodium: 134 mmol/L — ABNORMAL LOW (ref 135–145)

## 2024-09-13 NOTE — Progress Notes (Signed)
 " PROGRESS NOTE Dean Croft Sr.  FMW:[id number] DOB: 1951-07-01 DOA: 08/22/2024 PCP: [name], MD   Brief Narrative: The patient is a 73 year old male with PMH significant for CAD, PAF, ICM, VT/VF arrest s/p AICD, HFrEF (EF 35-40% in 04/2024), CKD stage 3b, hypertension, hyperlipidemia, type 2 diabetes, obstructive sleep apnea, and recent admission on 07/16/24 for HFrEF exacerbation who presented to ED on 08/22/2024 with back pain after falling from the wheelchair.  He was noted to have acute kidney injury, T6 fracture, and lower extremity edema.  Patient's hospital course was complicated by acute on chronic CHF with worsening renal function, concerning for cardiorenal syndrome, for which cardiology and nephrology were consulted.  Patient briefly required hemodialysis initiated for volume management, which has since been stopped with removal of TDC. He underwent thoracocentesis on 11/28 and 12/5 for worsening right pleural effusion.  Also required IV iron , with Hgb dropping to nadir of 6.3 now s/p transfusion of 2 units pRBC.   At this time, patient is being followed by Cardiology and Nephrology and is on dobutamine  drip. Currently medically stable for discharge but dispo pending insurance approval for home infusions.   Assessment & Plan:   Acute on chronic systolic heart failure Cardiorenal syndrome with renal failure: Low output biventricular heart failure: - Cardiology following.   - Echo showed EF of 35 to 40%.   - Cardiac catheterization 06/19/24 showed moderate pulmonary hypertension and low cardiac output.   - Patient was initially on milrinone  drip with cardiology which was subsequently discontinued and started on dobutamine  drip on 12/9.  - Strict I&Os.  - Continue dobutamine  infusion per [facility] team  - Continue PO torsemide  100 mg daily - AHF recommending starting metolazone  5 mg and KCl 40 mEq every Monday - Status post tunneled CVC placement for home inotropic therapy (12/12) -  TOC working on finding a agency for home infusion therapy.  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:  - AKI likely secondary to cardiorenal syndrome.  - Patient briefly required hemodialysis as inpatient, last HD session 12/9.  No need for further dialysis.  TDC catheter removed 12/11. Cr fluctuating but relatively stable within baseline range  Nephrology signed off on 12/19 - Patient follows with Dr. [name] as outpatient. Will need to follow up shortly after discharge.   PAF: - Continue amiodarone  400mg  daily.   - Patient was on Eliquis  5mg  BID briefly held due to drop in Hgb requiring transfusion. - Eliquis  held due to anemia requiring transfusion  Acute on chronic anemia: - Likely from CKD and iron  deficiency.  Received IV iron .  - Hb dropped to 6.3 on 12/17 w/o obvious signs of bleeding - s/p transfusion of 2 units pRBC - Hgb stable  Acute T6 fracture, physical deconditioning: - Neurosurgery was consulted.  Recommended TSLO brace and outpatient follow-up with neurosurgery Dr. [name].   - Continue PT, OT while in the hospital.   R pleural effusion: RML/RLL consolidation - Patient underwent IR guided thoracentesis on 11/28 with removal of 500 mL of bloody fluid.   - Patient also had repeat thoracentesis on 12/5, with removal of 1.5L of bloody fluid.   - Currently on room air and remains stable  Hyperkalemia : - Resolved   Hx of VT/VT arrest - s/p single chamber ICD - On amiodarone  400 mg daily, which was increased this hospitalization   Liver enhancement on imaging: - Could be secondary to amiodarone .  HLD - Continue Lipitor 40 mg daily  DVT prophylaxis: Place and maintain sequential compression device  Start: 09/10/24 1352 Place TED hose Start: 09/04/24 0954  Code Status: DNR Family Communication: None at bedside Disposition Plan:  Remains inpatient appropriate because: pending securing HH agency able to provide support for home inotropic therapy.   Consultants:   Cardiology Nephrology  Procedures:  Antimicrobials: Anti-infectives (From admission, onward)    Start     Dose/Rate Route Frequency Ordered Stop   08/28/24 1345  ceFAZolin  (ANCEF ) IVPB 2g/100 mL premix        2 g 200 mL/hr over 30 Minutes Intravenous To Radiology 08/28/24 1247 08/28/24 1545      Subjective: Examined at bedside. Reports doing well, states this is the best he's felt in a while. Hoping to go home soon. Unfortunately no HH agencies able to provide infusion care at home.   Objective: Vitals:   09/12/24 2325 09/12/24 2358 09/13/24 0302 09/13/24 0416  BP: (!) 142/64 (!) 141/48 (!) 120/49   Pulse: 75     Resp: 17  19   Temp: 97.8 F (36.6 C) 97.8 F (36.6 C) 98.3 F (36.8 C)   TempSrc: Temporal Oral Oral   SpO2: 93% 96% 95%   Weight:    82 kg  Height:        Intake/Output Summary (Last 24 hours) at 09/13/2024 0651 Last data filed at 09/13/2024 0600 Gross per 24 hour  Intake 967.84 ml  Output 2880 ml  Net -1912.16 ml   Filed Weights   09/10/24 0500 09/12/24 0500 09/13/24 0416  Weight: 88.9 kg 83.7 kg 82 kg   Examination:  Gen: NAD, A&Ox3 HEENT: NCAT, EOMI Neck: Supple CV: RRR, no murmurs Resp: normal WOB, CTAB, no w/r/r Abd: Soft, NTND, no guarding, BS normoactive Ext: Trace LE edema, pulses 2+ b/l Skin: Warm, dry, no rashes/lesions Neuro: No focal deficits Psych: Calm, cooperative, appropriate affect   Data Reviewed: I have personally reviewed following labs and imaging studies  CBC: Recent Labs  Lab 09/08/24 0500 09/09/24 0501 09/10/24 0826 09/11/24 0426  WBC  --  7.9 5.7 7.1  NEUTROABS  --   --  4.1  --   HGB 7.5* 8.0* 6.3* 9.9*  HCT 23.7* 25.5* 20.3* 30.0*  MCV  --  97.0 97.6 91.7  PLT  --  115* 80* 106*   Basic Metabolic Panel: Recent Labs  Lab 09/09/24 0501 09/10/24 0445 09/11/24 0426 09/12/24 0500 09/13/24 0420  NA 134* 135 133* 133* 134*  K 4.4 3.9 3.7 3.9 4.0  CL 99 98 95* 95* 95*  CO2 25 26 26 27 28   GLUCOSE 91  106* 144* 124* 112*  BUN 42* 42* 42* 43* 46*  CREATININE 2.57* 2.23* 2.08* 2.29* 2.38*  CALCIUM  10.2 10.4* 10.3 10.4* 10.5*  MG 2.0  --  2.0  --   --   PHOS 3.4 3.0 3.0 2.8 2.7    LOS: 22 days    Time spent: 50 mins  Leevon Upperman Al-Sultani, MD [facility]   If 7PM-7AM, please contact night-coverage  "

## 2024-09-13 NOTE — Progress Notes (Signed)
 [facility] night cross cover note:   Patient's RN contacted we regarding residual presence of dobutamine  drip in setting of cardiology's documented plan to d\c the dobuatmine drip. I independently confirmed cardiology's plan to have d\c'ed dobutamine  drip today.  To reconcile this, I subsequently discontinued the remaining dobutamine  drip ordered.    Eva Pore, DO Hospitalist

## 2024-09-13 NOTE — Progress Notes (Signed)
 Mobility Specialist Progress Note:    09/13/24 1132  Mobility  Activity Dangled on edge of bed;Stood at bedside;Pivoted/transferred to/from [facility]  Level of Assistance Contact guard assist, steadying assist  Assistive Device Other (Comment) (HHA)  Distance Ambulated (ft) 5 ft  Range of Motion/Exercises Active  Activity Response Tolerated fair  Mobility Referral Yes  Mobility visit 1 Mobility  Mobility Specialist Start Time (ACUTE ONLY) 1132  Mobility Specialist Stop Time (ACUTE ONLY) 1154  Mobility Specialist Time Calculation (min) (ACUTE ONLY) 22 min   Received pt laying in bed needing to be cleaned, changed, agreeable to session. No c/o any symptoms. Pt moving and ambulating well. Pt performed 3 STS, stood for several mins to be cleaned, and took steps over to recliner. Left pt in recliner w/ all needs met. RN notified.  Venetia Keel Mobility Specialist Please Neurosurgeon or Rehab Office at [phone]

## 2024-09-13 NOTE — Progress Notes (Signed)
 "    Advanced Heart Failure Rounding Note  Cardiologist: Vinie JAYSON Maxcy, MD  HF Cardiologist: Dr. [name]   Chief Complaint: Acute on chronic HFrEF  Subjective:    Now on DBA 2.5 co-ox 75%  CVP not reading  Feels good. Denies SOB. Eager to go home   Objective:    Weight Range: 82 kg Body mass index is 29.18 kg/m.   Vital Signs:   Temp:  [97.7 F (36.5 C)-98.3 F (36.8 C)] 97.7 F (36.5 C) (12/20 1158) Pulse Rate:  [72-75] 72 (12/20 1158) Resp:  [17-20] 18 (12/20 1158) BP: (120-149)/(48-64) 132/56 (12/20 1158) SpO2:  [93 %-100 %] 100 % (12/20 1158) Weight:  [82 kg] 82 kg (12/20 0416) Last BM Date : 09/11/24  Weight change: Filed Weights   09/10/24 0500 09/12/24 0500 09/13/24 0416  Weight: 88.9 kg 83.7 kg 82 kg   Intake/Output:  Intake/Output Summary (Last 24 hours) at 09/13/2024 1520 Last data filed at 09/13/2024 1300 Gross per 24 hour  Intake 727.84 ml  Output 1950 ml  Net -1222.16 ml     Physical Exam  General:  Sitting up in bed. No resp difficulty HEENT: normal Neck: supple. no JVD.  Cor: Regular rate & rhythm. No rubs, gallops or murmurs. Lungs: clear Abdomen: soft, nontender, nondistended.Good bowel sounds. Extremities: no cyanosis, clubbing, rash, tr edema Neuro: alert & orientedx3, cranial nerves grossly intact. moves all 4 extremities w/o difficulty. Affect pleasant  Telemetry   Sr   Labs    CBC Recent Labs    09/11/24 0426 09/13/24 0730  WBC 7.1 7.1  HGB 9.9* 9.6*  HCT 30.0* 29.9*  MCV 91.7 94.0  PLT 106* 111*   Basic Metabolic Panel Recent Labs    87/81/74 0426 09/12/24 0500 09/13/24 0420  NA 133* 133* 134*  K 3.7 3.9 4.0  CL 95* 95* 95*  CO2 26 27 28   GLUCOSE 144* 124* 112*  BUN 42* 43* 46*  CREATININE 2.08* 2.29* 2.38*  CALCIUM  10.3 10.4* 10.5*  MG 2.0  --   --   PHOS 3.0 2.8 2.7   Liver Function Tests Recent Labs    09/12/24 0500 09/13/24 0420  ALBUMIN  3.9 3.8    BNP (last 3 results) Recent Labs     06/25/24 1604 07/10/24 0227 08/22/24 0903  BNP 1,585.3* 1,952.1* 525.0*   Medications:    Scheduled Medications:  acetaminophen   650 mg Oral Q6H   amiodarone   400 mg Oral Daily   atorvastatin   40 mg Oral Daily   Chlorhexidine  Gluconate Cloth  6 each Topical Daily   collagenase    Topical Daily   darbepoetin (ARANESP ) injection - DIALYSIS  150 mcg Subcutaneous Q Tue-1800   hydrocerin   Topical BID   pantoprazole  (PROTONIX ) IV  40 mg Intravenous Q12H   polyethylene glycol  17 g Oral Daily   sodium chloride  flush  10-40 mL Intracatheter Q12H   torsemide   100 mg Oral Daily    Infusions:  DOBUTamine  2.5 mcg/kg/min (09/13/24 0400)    PRN Medications: HYDROmorphone  (DILAUDID ) injection, methocarbamol , mouth rinse, oxyCODONE , sodium chloride  flush  Assessment/Plan   Acute on chronic systolic heart failure: Primarily ischemic, prior history of infarcted RCA.  Multiple recent admission requiring milrinone . Now with progressive cardio-renal syndrome requiring HD.  - Most recent TTE 8/25 EF 35-40%, RV mildly reduced. TEE 9/25 with moderate MR - initially started on milrinone  for co-ox 59, stopped d/t hypotension and renal function - restarted on milrinone  12/8 for again co-ox 48% -  switched milrinone  to dobutamine  12/9 - CO-OX remains stable on DBA 2.5 - Will stop DBA today and follow  - Volume status ok Continue  PO torsemide , 100 mg daily - GDMT limited by CKD Stage IV - He is now tolerating DBA wean. Will stop  today and follow. If co-ox stable home tomorrow or Monday off DBVA  Mitral regurgitation: 2/2 ischemic disease. TEE 9/25 with more moderate MR by PISA, valve area, systolic blunting of pulmonary veins. - Continue HF optimization per above    AKI on CKD 4: Long standing. Now with progressive cardio-renal disease. Nephrology feels that he has a change for renal recovery with inotrope support. - Cr peaked 5 with anuria.  s/p HDC placement and had iHD. Last session 09/02/24. HD  cath removed.  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 TSH okay - Frequent PVCs on DBA. Improved with increasing amiodarone  to 400 mg daily   CAD: Single vessel RCA disease - No s/s angina - Continue statin. No aspirin  with need for anticoagulation   Abnormal Chest CT  - posterior right pulmonary nodule is new since prior study + s/o cirrhosis + hepatic lesions concerning for ? Metastatic dz, though liver cirrhosis may be 2/2 HF or amio. Recent LFTs stable.  - differ further w/u to IM/PCP    Acute on chronic anemia - transfused 2 u RBCs (12/17). Hgb stable 9.6 - hgb stable  - Now off eliquis , risk likely outweighs benefit at this point.   GOC -DNR/DNI -Not a candidate for advanced therapies.   HF Meds for D/C   Torsemide  100 mg daily   Metolazone  5 mg  every Monday + 40 meq Potassium  Amiodarone  400 mg daily  Eliquis  5 mg twice a day   HF follow up has been set up   Length of Stay: 22  [name], MD  09/13/2024, 3:20 PM  Advanced Heart Failure Team Pager [phone] (M-F; 7a - 5p)  Please contact [facility] for night-coverage after hours (5p -7a ) and weekends on [email or web address]     "

## 2024-09-14 ENCOUNTER — Other Ambulatory Visit (HOSPITAL_COMMUNITY): Payer: Self-pay

## 2024-09-14 DIAGNOSIS — N179 Acute kidney failure, unspecified: Secondary | ICD-10-CM | POA: Diagnosis not present

## 2024-09-14 DIAGNOSIS — T07XXXA Unspecified multiple injuries, initial encounter: Secondary | ICD-10-CM | POA: Diagnosis not present

## 2024-09-14 DIAGNOSIS — I1310 Hypertensive heart and chronic kidney disease without heart failure, with stage 1 through stage 4 chronic kidney disease, or unspecified chronic kidney disease: Secondary | ICD-10-CM | POA: Diagnosis not present

## 2024-09-14 DIAGNOSIS — I5023 Acute on chronic systolic (congestive) heart failure: Secondary | ICD-10-CM | POA: Diagnosis not present

## 2024-09-14 LAB — CBC
HCT: 34 % — ABNORMAL LOW (ref 39.0–52.0)
Hemoglobin: 10.9 g/dL — ABNORMAL LOW (ref 13.0–17.0)
MCH: 29.9 pg (ref 26.0–34.0)
MCHC: 32.1 g/dL (ref 30.0–36.0)
MCV: 93.4 fL (ref 80.0–100.0)
Platelets: 157 K/uL (ref 150–400)
RBC: 3.64 MIL/uL — ABNORMAL LOW (ref 4.22–5.81)
RDW: 17.6 % — ABNORMAL HIGH (ref 11.5–15.5)
WBC: 7.8 K/uL (ref 4.0–10.5)
nRBC: 0 % (ref 0.0–0.2)

## 2024-09-14 LAB — BASIC METABOLIC PANEL WITH GFR
Anion gap: 12 (ref 5–15)
BUN: 50 mg/dL — ABNORMAL HIGH (ref 8–23)
CO2: 28 mmol/L (ref 22–32)
Calcium: 10.3 mg/dL (ref 8.9–10.3)
Chloride: 97 mmol/L — ABNORMAL LOW (ref 98–111)
Creatinine, Ser: 2.33 mg/dL — ABNORMAL HIGH (ref 0.61–1.24)
GFR, Estimated: 29 mL/min — ABNORMAL LOW
Glucose, Bld: 94 mg/dL (ref 70–99)
Potassium: 4.1 mmol/L (ref 3.5–5.1)
Sodium: 137 mmol/L (ref 135–145)

## 2024-09-14 MED ORDER — PANTOPRAZOLE SODIUM 40 MG PO TBEC
40.0000 mg | DELAYED_RELEASE_TABLET | Freq: Every day | ORAL | Status: DC
  Administered 2024-09-15: 40 mg via ORAL
  Filled 2024-09-14: qty 1

## 2024-09-14 MED ORDER — APIXABAN 5 MG PO TABS
5.0000 mg | ORAL_TABLET | Freq: Two times a day (BID) | ORAL | Status: DC
  Administered 2024-09-14 – 2024-09-15 (×2): 5 mg via ORAL
  Filled 2024-09-14 (×2): qty 1

## 2024-09-14 MED ORDER — MELATONIN 3 MG PO TABS
3.0000 mg | ORAL_TABLET | Freq: Every day | ORAL | Status: DC
  Administered 2024-09-14: 3 mg via ORAL
  Filled 2024-09-14: qty 1

## 2024-09-14 MED ORDER — METOLAZONE 2.5 MG PO TABS
2.5000 mg | ORAL_TABLET | Freq: Once | ORAL | Status: AC
  Administered 2024-09-14: 2.5 mg via ORAL
  Filled 2024-09-14: qty 1

## 2024-09-14 NOTE — Progress Notes (Signed)
 Mobility Specialist Progress Note:    09/14/24 1221  Mobility  Activity Ambulated with assistance  Level of Assistance Standby assist, set-up cues, supervision of patient - no hands on  Assistive Device Front wheel walker  Distance Ambulated (ft) 15 ft  Range of Motion/Exercises Active  Activity Response Tolerated well  Mobility Referral Yes  Mobility visit 1 Mobility  Mobility Specialist Start Time (ACUTE ONLY) 1221  Mobility Specialist Stop Time (ACUTE ONLY) 1233  Mobility Specialist Time Calculation (min) (ACUTE ONLY) 12 min   Received pt ambulating in room to bathroom. No c/o any symptoms. Pt moving and ambulating well. Pt returned to EOB w/' all needs met.   Venetia Keel Mobility Specialist Please Neurosurgeon or Rehab Office at [phone]

## 2024-09-14 NOTE — Progress Notes (Signed)
 "    Advanced Heart Failure Rounding Note  Cardiologist: Vinie JAYSON Maxcy, MD  HF Cardiologist: Dr. [name]   Chief Complaint: Acute on chronic HFrEF  Subjective:    DBA stopped yesterday. Co-ox 75% CVP 12  Feels good. Scr improving.    Objective:    Weight Range: 83.5 kg Body mass index is 29.71 kg/m.   Vital Signs:   Temp:  [97.7 F (36.5 C)-98.7 F (37.1 C)] 98.4 F (36.9 C) (12/21 0805) Pulse Rate:  [70-75] 70 (12/21 0805) Resp:  [18-25] 20 (12/21 0350) BP: (135-140)/(50-62) 140/50 (12/21 0805) SpO2:  [95 %-98 %] 95 % (12/21 0350) Weight:  [83.5 kg] 83.5 kg (12/21 0350) Last BM Date : 09/11/24  Weight change: Filed Weights   09/12/24 0500 09/13/24 0416 09/14/24 0350  Weight: 83.7 kg 82 kg 83.5 kg   Intake/Output:  Intake/Output Summary (Last 24 hours) at 09/14/2024 1302 Last data filed at 09/14/2024 0700 Gross per 24 hour  Intake 656.35 ml  Output 975 ml  Net -318.65 ml     Physical Exam   General:  Sitting up on side of  bed. No resp difficulty HEENT: normal Neck: supple. JVP to jaw  Cor: Regular rate & rhythm. No rubs, gallops or murmurs. Lungs: clear Abdomen: soft, nontender, nondistended.Good bowel sounds. Extremities: no cyanosis, clubbing, rash, edema Neuro: alert & orientedx3, cranial nerves grossly intact. moves all 4 extremities w/o difficulty. Affect pleasant   Telemetry   SR 70s Personally reviewed  Labs    CBC Recent Labs    09/13/24 0730 09/14/24 0555  WBC 7.1 7.8  HGB 9.6* 10.9*  HCT 29.9* 34.0*  MCV 94.0 93.4  PLT 111* 157   Basic Metabolic Panel Recent Labs    87/80/74 0500 09/13/24 0420 09/14/24 0555  NA 133* 134* 137  K 3.9 4.0 4.1  CL 95* 95* 97*  CO2 27 28 28   GLUCOSE 124* 112* 94  BUN 43* 46* 50*  CREATININE 2.29* 2.38* 2.33*  CALCIUM  10.4* 10.5* 10.3  PHOS 2.8 2.7  --    Liver Function Tests Recent Labs    09/12/24 0500 09/13/24 0420  ALBUMIN  3.9 3.8    BNP (last 3 results) Recent Labs     06/25/24 1604 07/10/24 0227 08/22/24 0903  BNP 1,585.3* 1,952.1* 525.0*   Medications:    Scheduled Medications:  acetaminophen   650 mg Oral Q6H   amiodarone   400 mg Oral Daily   atorvastatin   40 mg Oral Daily   Chlorhexidine  Gluconate Cloth  6 each Topical Daily   collagenase    Topical Daily   darbepoetin (ARANESP ) injection - DIALYSIS  150 mcg Subcutaneous Q Tue-1800   hydrocerin   Topical BID   [START ON 09/15/2024] pantoprazole   40 mg Oral Daily   polyethylene glycol  17 g Oral Daily   sodium chloride  flush  10-40 mL Intracatheter Q12H   torsemide   100 mg Oral Daily    Infusions:    PRN Medications: HYDROmorphone  (DILAUDID ) injection, methocarbamol , mouth rinse, oxyCODONE , sodium chloride  flush  Assessment/Plan   Acute on chronic systolic heart failure: Primarily ischemic, prior history of infarcted RCA.  Multiple recent admission requiring milrinone . Now with progressive cardio-renal syndrome requiring HD.  - Most recent TTE 8/25 EF 35-40%, RV mildly reduced. TEE 9/25 with moderate MR - initially started on milrinone  for co-ox 59, stopped d/t hypotension and renal function - restarted on milrinone  12/8 for again co-ox 48% - switched milrinone  to dobutamine  12/9 - CO-OX remains stable off  DBA. No need for home inotropes - Volume status slightly elevated CVP 12 despite torsemide  100 daily will givve one dose metolazone  2.5. May need weekly dosing - GDMT limited by CKD Stage IV - Hopefully home in am if co-ox, volume and renal function stable  Mitral regurgitation: 2/2 ischemic disease. TEE 9/25 with more moderate MR by PISA, valve area, systolic blunting of pulmonary veins. - Continue HF optimization per above    AKI on CKD 4: Long standing. Now with progressive cardio-renal disease. Nephrology feels that he has a change for renal recovery with inotrope support. - Cr peaked 5 with anuria.  s/p HDC placement and had iHD. Last session 09/02/24. HD cath removed.  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 TSH okay - Frequent PVCs on DBA. Improved with increasing amiodarone  to 400 mg daily   CAD: Single vessel RCA disease - No s/s angina - Continue statin. No aspirin  with need for anticoagulation   Abnormal Chest CT  - posterior right pulmonary nodule is new since prior study + s/o cirrhosis + hepatic lesions concerning for ? Metastatic dz, though liver cirrhosis may be 2/2 HF or amio. Recent LFTs stable.  - differ further w/u to IM/PCP    Acute on chronic anemia - transfused 2 u RBCs (12/17). Hgb stable 9.6 - hgb stable  - Now off eliquis , risk likely outweighs benefit at this point.   GOC -DNR/DNI -Not a candidate for advanced therapies.   HF Meds for D/C   Torsemide  100 mg daily   Metolazone  5 mg  every Monday + 40 meq Potassium  Amiodarone  400 mg daily  Eliquis  5 mg twice a day   HF follow up has been set up   Length of Stay: 23  [name], MD  09/14/2024, 1:02 PM  Advanced Heart Failure Team Pager [phone] (M-F; 7a - 5p)  Please contact [facility] for night-coverage after hours (5p -7a ) and weekends on [email or web address]     "

## 2024-09-14 NOTE — Progress Notes (Signed)
 " PROGRESS NOTE Dean Croft Sr.  FMW:[id number] DOB: 10/12/1950 DOA: 08/22/2024 PCP: [name], MD   Brief Narrative: The patient is a 73 year old male with PMH significant for CAD, PAF, ICM, VT/VF arrest s/p AICD, HFrEF (EF 35-40% in 04/2024), CKD stage 3b, hypertension, hyperlipidemia, type 2 diabetes, obstructive sleep apnea, and recent admission on 07/16/24 for HFrEF exacerbation who presented to ED on 08/22/2024 with back pain after falling from the wheelchair.  He was noted to have acute kidney injury, T6 fracture, and lower extremity edema.  Patient's hospital course was complicated by acute on chronic CHF with worsening renal function, concerning for cardiorenal syndrome, for which cardiology and nephrology were consulted.  Patient briefly required hemodialysis initiated for volume management, which has since been stopped with removal of TDC. He underwent thoracocentesis on 11/28 and 12/5 for worsening right pleural effusion.  Also required IV iron , with Hgb dropping to nadir of 6.3 now s/p transfusion of 2 units pRBC.  At this time, dobutamine  drip has been weaned off.  AHF team optimizing patient's volume status with addition of metolazone  today.  Likely patient will be discharged home tomorrow without need for home inotrope infusion set up.  Assessment & Plan:   Acute on chronic systolic heart failure Cardiorenal syndrome with renal failure Low output biventricular heart failure - Cardiology/AHF following   - Echo showed EF of 35 to 40%.   - Cardiac catheterization 06/19/24 showed moderate pulmonary hypertension and low cardiac output.   - Patient was initially on milrinone  drip with cardiology which was subsequently discontinued and started on dobutamine  drip on 12/9.  - Strict I&Os.  - AHF recommending starting metolazone  5 mg and KCl 40 mEq every Monday on discharge - Weaned off dobutamine  per AHF - Continue PO torsemide  100 mg daily - Status post tunneled CVC placement for  home inotropic therapy (12/12). Will discontinue on discharge if going home without dobutamine   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:  - AKI likely secondary to cardiorenal syndrome.  - Patient briefly required hemodialysis as inpatient, last HD session 12/9.  No need for further dialysis.  TDC catheter removed 12/11. Cr fluctuating but relatively stable within baseline range  Nephrology signed off on 12/19 - Patient follows with Dr. [name] as outpatient. Will need to follow up shortly after discharge.   PAF: - Continue amiodarone  400mg  daily.   - Patient was on Eliquis  5mg  BID briefly held due to drop in Hgb requiring transfusion. - Eliquis  held due to anemia requiring transfusion, okay to resume per AHF - Eliquis  5 mg BID resumed  Acute on chronic anemia: - Likely from CKD and iron  deficiency.  Received IV iron .  - Hb dropped to 6.3 on 12/17 w/o obvious signs of bleeding - s/p transfusion of 2 units pRBC - Hgb stable  Acute T6 fracture, physical deconditioning: - Neurosurgery was consulted.  Recommended TSLO brace and outpatient follow-up with neurosurgery Dr. [name].   - Continue PT, OT while in the hospital.   R pleural effusion: RML/RLL consolidation - Patient underwent IR guided thoracentesis on 11/28 with removal of 500 mL of bloody fluid.   - Patient also had repeat thoracentesis on 12/5, with removal of 1.5L of bloody fluid.   - Currently on room air and remains stable  Hyperkalemia : - Resolved   Hx of VT/VT arrest - s/p single chamber ICD - On amiodarone  400 mg daily, which was increased this hospitalization   Liver enhancement on imaging: - Could be secondary to amiodarone .  HLD -  Continue Lipitor 40 mg daily  DVT prophylaxis: Place and maintain sequential compression device Start: 09/10/24 1352 Place TED hose Start: 09/04/24 0954  Code Status: DNR Family Communication: None at bedside Disposition Plan: Likely home tomorrow if volume status and co-ox stable after  discontinuation of dobutamine   Consultants:  Cardiology Nephrology General surgery Neurosurgery  Procedures:  Antimicrobials: Anti-infectives (From admission, onward)    Start     Dose/Rate Route Frequency Ordered Stop   08/28/24 1345  ceFAZolin  (ANCEF ) IVPB 2g/100 mL premix        2 g 200 mL/hr over 30 Minutes Intravenous To Radiology 08/28/24 1247 08/28/24 1545      Subjective: Examined at bedside. Has no complaints today. He's in good spirits. Off dobutamine  overnight.   Objective: Vitals:   09/13/24 2035 09/13/24 2355 09/14/24 0350 09/14/24 0805  BP:  (!) 135/56 137/62 (!) 140/50  Pulse: 75  70 70  Resp: 18 (!) 25 20   Temp: 98.7 F (37.1 C) 97.7 F (36.5 C) 97.8 F (36.6 C) 98.4 F (36.9 C)  TempSrc: Temporal Temporal Temporal Oral  SpO2:  98% 95%   Weight:   83.5 kg   Height:        Intake/Output Summary (Last 24 hours) at 09/14/2024 1530 Last data filed at 09/14/2024 0700 Gross per 24 hour  Intake 656.35 ml  Output 975 ml  Net -318.65 ml   Filed Weights   09/12/24 0500 09/13/24 0416 09/14/24 0350  Weight: 83.7 kg 82 kg 83.5 kg   Examination:  Gen: NAD, A&Ox3 HEENT: NCAT, EOMI Neck: Supple CV: RRR, no murmurs Resp: normal WOB, CTAB, no w/r/r Abd: Soft, NTND, no guarding, BS normoactive Ext: Trace LE edema, pulses 2+ b/l Skin: Warm, dry, no rashes/lesions Neuro: No focal deficits Psych: Calm, cooperative, appropriate affect   Data Reviewed: I have personally reviewed following labs and imaging studies  CBC: Recent Labs  Lab 09/09/24 0501 09/10/24 0826 09/11/24 0426 09/13/24 0730 09/14/24 0555  WBC 7.9 5.7 7.1 7.1 7.8  NEUTROABS  --  4.1  --   --   --   HGB 8.0* 6.3* 9.9* 9.6* 10.9*  HCT 25.5* 20.3* 30.0* 29.9* 34.0*  MCV 97.0 97.6 91.7 94.0 93.4  PLT 115* 80* 106* 111* 157   Basic Metabolic Panel: Recent Labs  Lab 09/09/24 0501 09/10/24 0445 09/11/24 0426 09/12/24 0500 09/13/24 0420 09/14/24 0555  NA 134* 135 133* 133*  134* 137  K 4.4 3.9 3.7 3.9 4.0 4.1  CL 99 98 95* 95* 95* 97*  CO2 25 26 26 27 28 28   GLUCOSE 91 106* 144* 124* 112* 94  BUN 42* 42* 42* 43* 46* 50*  CREATININE 2.57* 2.23* 2.08* 2.29* 2.38* 2.33*  CALCIUM  10.2 10.4* 10.3 10.4* 10.5* 10.3  MG 2.0  --  2.0  --   --   --   PHOS 3.4 3.0 3.0 2.8 2.7  --     LOS: 23 days    Time spent: 35 mins  Tanee Henery Al-Sultani, MD [facility]   If 7PM-7AM, please contact night-coverage  "

## 2024-09-15 ENCOUNTER — Other Ambulatory Visit (HOSPITAL_COMMUNITY): Payer: Self-pay

## 2024-09-15 ENCOUNTER — Inpatient Hospital Stay (HOSPITAL_COMMUNITY)

## 2024-09-15 DIAGNOSIS — R932 Abnormal findings on diagnostic imaging of liver and biliary tract: Secondary | ICD-10-CM | POA: Insufficient documentation

## 2024-09-15 DIAGNOSIS — I5023 Acute on chronic systolic (congestive) heart failure: Secondary | ICD-10-CM | POA: Diagnosis not present

## 2024-09-15 DIAGNOSIS — I272 Pulmonary hypertension, unspecified: Secondary | ICD-10-CM | POA: Insufficient documentation

## 2024-09-15 DIAGNOSIS — I130 Hypertensive heart and chronic kidney disease with heart failure and stage 1 through stage 4 chronic kidney disease, or unspecified chronic kidney disease: Secondary | ICD-10-CM | POA: Insufficient documentation

## 2024-09-15 DIAGNOSIS — D509 Iron deficiency anemia, unspecified: Secondary | ICD-10-CM | POA: Insufficient documentation

## 2024-09-15 DIAGNOSIS — T07XXXA Unspecified multiple injuries, initial encounter: Secondary | ICD-10-CM | POA: Diagnosis not present

## 2024-09-15 DIAGNOSIS — J90 Pleural effusion, not elsewhere classified: Secondary | ICD-10-CM | POA: Insufficient documentation

## 2024-09-15 HISTORY — PX: IR REMOVAL TUN CV CATH W/O FL: IMG2289

## 2024-09-15 LAB — CBC
HCT: 34.4 % — ABNORMAL LOW (ref 39.0–52.0)
Hemoglobin: 11.1 g/dL — ABNORMAL LOW (ref 13.0–17.0)
MCH: 30.2 pg (ref 26.0–34.0)
MCHC: 32.3 g/dL (ref 30.0–36.0)
MCV: 93.7 fL (ref 80.0–100.0)
Platelets: UNDETERMINED K/uL (ref 150–400)
RBC: 3.67 MIL/uL — ABNORMAL LOW (ref 4.22–5.81)
RDW: 17.6 % — ABNORMAL HIGH (ref 11.5–15.5)
WBC: 7.4 K/uL (ref 4.0–10.5)
nRBC: 0 % (ref 0.0–0.2)

## 2024-09-15 LAB — COOXEMETRY PANEL
Carboxyhemoglobin: 2.2 % — ABNORMAL HIGH (ref 0.5–1.5)
Carboxyhemoglobin: 3.7 % — ABNORMAL HIGH (ref 0.5–1.5)
Methemoglobin: <0.7 % (ref 0.0–1.5)
Methemoglobin: 1.2 % (ref 0.0–1.5)
O2 Saturation: 50.9 %
O2 Saturation: 96.6 %
Total hemoglobin: 11.2 g/dL — ABNORMAL LOW (ref 12.0–16.0)
Total hemoglobin: 11.3 g/dL — ABNORMAL LOW (ref 12.0–16.0)

## 2024-09-15 LAB — BASIC METABOLIC PANEL WITH GFR
Anion gap: 12 (ref 5–15)
BUN: 55 mg/dL — ABNORMAL HIGH (ref 8–23)
CO2: 27 mmol/L (ref 22–32)
Calcium: 10.4 mg/dL — ABNORMAL HIGH (ref 8.9–10.3)
Chloride: 94 mmol/L — ABNORMAL LOW (ref 98–111)
Creatinine, Ser: 2.36 mg/dL — ABNORMAL HIGH (ref 0.61–1.24)
GFR, Estimated: 28 mL/min — ABNORMAL LOW
Glucose, Bld: 148 mg/dL — ABNORMAL HIGH (ref 70–99)
Potassium: 3.3 mmol/L — ABNORMAL LOW (ref 3.5–5.1)
Sodium: 133 mmol/L — ABNORMAL LOW (ref 135–145)

## 2024-09-15 LAB — MAGNESIUM: Magnesium: 1.8 mg/dL (ref 1.7–2.4)

## 2024-09-15 MED ORDER — AMIODARONE HCL 200 MG PO TABS
400.0000 mg | ORAL_TABLET | Freq: Every day | ORAL | 0 refills | Status: DC
  Filled 2024-09-15: qty 60, 30d supply, fill #0

## 2024-09-15 MED ORDER — METOLAZONE 2.5 MG PO TABS
2.5000 mg | ORAL_TABLET | ORAL | 0 refills | Status: AC
  Filled 2024-09-15: qty 12, 84d supply, fill #0

## 2024-09-15 MED ORDER — POTASSIUM CHLORIDE CRYS ER 20 MEQ PO TBCR
40.0000 meq | EXTENDED_RELEASE_TABLET | ORAL | 0 refills | Status: DC
  Filled 2024-09-15: qty 30, 100d supply, fill #0

## 2024-09-15 MED ORDER — TORSEMIDE 100 MG PO TABS
100.0000 mg | ORAL_TABLET | Freq: Every day | ORAL | 0 refills | Status: AC
  Filled 2024-09-15: qty 30, 30d supply, fill #0

## 2024-09-15 MED ORDER — POTASSIUM CHLORIDE CRYS ER 20 MEQ PO TBCR
40.0000 meq | EXTENDED_RELEASE_TABLET | Freq: Once | ORAL | Status: AC
  Administered 2024-09-15: 40 meq via ORAL
  Filled 2024-09-15: qty 2

## 2024-09-15 NOTE — Progress Notes (Signed)
 PICC Removal Note: PICC line removed from RUE. PICC catheter tip visualized and intact. Pressure dressing applied. No redness, ecchymosis, edema, swelling, or drainage noted at site. Instructions provided on post PICC discharge care, including followup notification instructions.  Bedrest until 1130.

## 2024-09-15 NOTE — Discharge Summary (Signed)
 " Physician Discharge Summary   Patient: Dean [name]. MRN: [id number] DOB: September 04, 1951  Admit date:     08/22/2024  Discharge date: 09/15/2024  Discharge Physician: Duffy Al-Sultani   PCP: [name], MD   Recommendations at discharge:   Follow up with PCP within 1-2 weeks of discharge Repeat CBC to ensure Hgb stable within 1 week to ensure Hgb remains stable. Recommend repeat BMP within 1 week to monitor potassium levels Recommend standard amiodarone  surveillance with repeat LFTs in 4-6 weeks and then possibly q6 months or sooner if indicated Repeat CXR within 1 week of discharge to monitor right pleural effusion Follow up with AHF team as scheduled Follow up with NSG, Dr. 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)   Iron  deficiency anemia, unspecified   Pleural effusion   Moderate pulmonary hypertension (HCC)   Abnormal findings on diagnostic imaging of liver and biliary tract  Hospital Course:  The patient is a 73 year old male with PMH significant for CAD, PAF, ICM, VT/VF arrest s/p AICD, HFrEF (EF 35-40% in 04/2024), CKD stage 3b, hypertension, hyperlipidemia, type 2 diabetes, obstructive sleep apnea, and recent admission on 07/16/24 for HFrEF exacerbation who presented to ED on 08/22/2024 with back pain after falling out of a wheelchair down several steps, found to have T6 fracture, acute  kidney injury, right pleural effusion, with acute on chronic systolic heart failure.  Acute on chronic systolic heart failure (EF 35-40%) Cardiorenal syndrome with renal failure: Low output biventricular heart failure: - Cardiology and advanced heart failure followed the patient during his hospitalization - Last Echo (04/2024) showed EF of 35 to 40%.   - Cardiac catheterization 06/19/24 showed moderate pulmonary hypertension and low cardiac output.   - Patient was initially started on milrinone  drip by cardiology, which was subsequently discontinued and started on dobutamine  drip on 12/9. He was initially thought to be going home with dobutamine  infusion and underwent tunneled CVC placement for home inotropic therapy on 12/12, but was ultimately weaned off on 12/20.  Tunneled CVC removed prior to discharge. - AHF managed the patient's diuresis and recommended PO torsemide  100 mg daily with metolazone  5 mg and KCl 40 mEq every Monday  AKI on CKD Stage IV - AKI was thought to be secondary to cardiorenal syndrome - Nephrology followed the patient's course. He briefly required hemodialysis as inpatient for volume management. His last HD session was on 12/9.  He was deemed to no longer need hemodialysis.  His tunneled dialysis catheter was removed on 12/11.  His creatinine continued to fluctuate but was relatively stable within baseline range.  Nephrology signed off on 12/19.  Patient is to follow-up with Dr. [name] in the outpatient setting.  Paroxysmal atrial fibrillation - Home amiodarone  increased to 400 mg daily. Eliquis  was initially held due to bloody pleural effusion and later a drop in Hgb. Ultimately Hgb stabilized and Eliquis  was resumed on 12/21.  Acute  on chronic anemia: - Likely from CKD and iron  deficiency.  Received IV iron .  - Hb dropped to 6.3 on 12/17 w/o obvious signs of bleeding - s/p transfusion of 2 units pRBC - Hgb stable at the time of discharge at 11.1 - Will need to  repeat CBC within 1 week of discharge to ensure stability  Acute T6 fracture, physical deconditioning: - Neurosurgery was consulted.  Recommended TSLO brace and outpatient follow-up with neurosurgery Dr. [name].     R pleural effusion: RML/RLL consolidation - Patient underwent IR guided thoracentesis on 11/28 with removal of 500 mL of bloody fluid.   - Patient also had repeat thoracentesis on 12/5, with removal of 1.5L of bloody fluid.   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 - LFTs currently wnl. T bili slightly elevated but lower than prior - Recommend routine monitoring of LFTs while on amiodaronein 4-6 weeks then possibly every 6 months or more frequently if necessary  HLD - Continue Lipitor 40 mg daily  At the time of discharge, the patient was hemodynamically stable with improved symptoms, clinically appropriate for discharge, and in no acute distress. The discharge plan, follow-up instructions, return precautions, and medication changes were reviewed in detail. The patient verbalized understanding, was in agreement with the plan, and had all questions answered prior to leaving the hospital.       Consultants: Neurosurgery, general surgery, cardiology, advanced heart failure Procedures performed: Right-sided thoracentesis 11/28,  Disposition: Home Diet recommendation:  Diet Orders (From admission, onward)     Start     Ordered   08/28/24 1718  Diet renal with fluid restriction Fluid restriction: 1200 mL  Fluid; Room service appropriate? Yes; Fluid consistency: Thin  Diet effective now       Question Answer Comment  Fluid restriction: 1200 mL Fluid   Room service appropriate? Yes   Fluid consistency: Thin      08/28/24 1717            DISCHARGE MEDICATION: Allergies as of 09/15/2024       Reactions   Contrast Media [iodinated Contrast Media] Shortness Of Breath, Nausea And Vomiting   Lisinopril  Cough        Medication List     STOP taking these medications    furosemide  20 MG tablet Commonly known as: LASIX    furosemide  40 MG tablet Commonly known as: LASIX    olmesartan 20 MG tablet Commonly known as: BENICAR   tiZANidine 2 MG tablet Commonly known as: ZANAFLEX       TAKE these medications    allopurinol  300 MG tablet Commonly known as: ZYLOPRIM  Take 900 mg by mouth daily.   amiodarone  200 MG tablet Commonly known as: PACERONE  Take 2 tablets (400 mg total) by mouth daily. What changed: how much to take   atorvastatin  40 MG tablet Commonly known as: LIPITOR Take 40 mg by mouth daily.   cholecalciferol  25 MCG (1000 UNIT) tablet Commonly known as: VITAMIN D3 Take 1,000 Units by mouth daily.  Eliquis  5 MG Tabs tablet Generic drug: apixaban  TAKE 1 TABLET BY MOUTH TWICE A DAY   ferrous sulfate  325 (65 FE) MG tablet Take 325 mg by mouth daily.   metolazone  2.5 MG tablet Commonly known as: ZAROXOLYN  Take 1 tablet (2.5 mg total) by mouth every Monday. Take 40 mEq of potassium (KCl) with the metolazone .   polyethylene glycol powder 17 GM/SCOOP powder Commonly known as: GLYCOLAX /MIRALAX  Take 17 g by mouth daily. What changed:  when to take this reasons to take this   potassium chloride  SA 20 MEQ tablet Commonly known as: KLOR-CON  M Take 2 tablets (40 mEq total) by mouth every Monday. Take with your metolazone  on Mondays. What changed:  how much to take when to take this additional instructions   torsemide  100 MG tablet Commonly known as:  DEMADEX  Take 1 tablet (100 mg total) by mouth daily.   zolpidem  5 MG tablet Commonly known as: AMBIEN  Take 5 mg by mouth at bedtime.        Follow-up Information     Louis Shove, MD Follow up in 2 week(s).   Specialty: Neurosurgery Contact information: [address] [place] [address] [phone]         [facility] Follow up on 09/19/2024.   Specialty: Cardiology Why: at 11:15 Contact information: [address] [place]  [address] [phone]        [name], MD Follow up.   Specialty: Internal Medicine Why: hospital follow up appt with PCP on 09/16/2024 at 10:15 am please bring insurance card, copay, and list of medications (AVS) PCP will sign off on Home Health Contact information: [address] [place] [address] [phone]         [name] Mateo Larger, MD. Schedule an appointment as soon as possible for a visit in 2 week(s).   Specialties: Nephrology, Internal Medicine Contact information: [address] [place] [address] [phone]         [facility]) Follow up.   Specialty: Home Health Services Why: Home Health RN and Physical Therapy- agency will call to arrange appt Contact information: [address] [place] [address] [facility] [place]  [address] [phone]                Discharge Exam: Fredricka Weights   09/13/24 0416 09/14/24 0350 09/15/24 0300  Weight: 82 kg 83.5 kg 80.7 kg   Blood pressure 138/61, pulse 73, temperature 98.5 F (36.9 C), temperature source Oral, resp. rate (!) 21, height 5' 6 (1.676 m), weight 80.7 kg, SpO2 100%.   Gen: NAD, A&Ox3 HEENT: NCAT, EOMI Neck: Supple CV: RRR, no murmurs Resp: normal WOB, CTAB, no w/r/r Abd: Soft, NTND, no guarding, BS normoactive Ext: Trace LE edema, pulses 2+ b/l Skin: Warm, dry, no rashes/lesions Neuro: No focal deficits Psych: Calm,  cooperative, appropriate affect  Condition at discharge: fair  The results of significant diagnostics from this hospitalization (including imaging, microbiology, ancillary and laboratory) are listed below for reference.   Imaging Studies: DG CHEST PORT 1 VIEW Result Date: 09/10/2024 CLINICAL DATA:  Pleural effusion EXAM: PORTABLE CHEST 1 VIEW COMPARISON:  September 04, 2024 FINDINGS: Stable cardiomegaly. Stable single lead left-sided defibrillator. Interval placement of right internal jugular catheter with distal tip in expected position of SVC. Small right pleural effusion is noted with associated right basilar atelectasis or scarring. Bony thorax is unremarkable. IMPRESSION: Interval placement of right internal jugular  catheter with distal tip in expected position of SVC. Small right pleural effusion is noted with associated right basilar atelectasis or scarring. Electronically Signed   By: Lynwood Landy Raddle M.D.   On: 09/10/2024 16:25   IR TUNNELED CENTRAL VENOUS CATH [facility] W IMG Result Date: 09/05/2024 INDICATION: 73 year old male with chronic kidney disease in need of durable venous access. He presents for tunneled central venous catheter placement. EXAM: IR TUNNELED CENTRAL VENOUS CATH [facility] W IMG MEDICATIONS: None ANESTHESIA/SEDATION: None FLUOROSCOPY TIME:  Radiation exposure index: 10.9 mGy, air kerma COMPLICATIONS: None immediate. PROCEDURE: Informed written consent was obtained from the patient after a thorough discussion of the procedural risks, benefits and alternatives. All questions were addressed. Maximal Sterile Barrier Technique was utilized including caps, mask, sterile gowns, sterile gloves, sterile drape, hand hygiene and skin antiseptic. A timeout was performed prior to the initiation of the procedure. The right internal jugular vein was interrogated with ultrasound and found to be patent. A small amount of nonocclusive thrombus is present along the vessel wall likely representing chronic  thrombus or residual fibrin sheath from a prior venous access. An image was obtained and stored for the medical record. Local anesthesia was attained by infiltration with 1% lidocaine . A small dermatotomy was made. Under real-time sonographic guidance, the vessel was punctured with a 21 gauge micropuncture needle. Using standard technique, the initial micro needle was exchanged over a 0.018 micro wire for a peel-away sheath which was advanced into the right atrium. A suitable skin exit site was selected on the anterior chest wall. Local anesthesia was attained by infiltration with 1% lidocaine . A small dermatotomy was made. A tunneled single lumen central venous catheter was then tunneled from the skin exit site to the dermatotomy overlying the venous access site. The catheter was cut to length (24 cm) and advanced through the peel-away sheath. The peel-away sheath was removed. An image was obtained demonstrating the tip of the catheter at the cavoatrial junction. The catheter flushes and aspirates easily. The catheter was capped, flushed and secured to the skin with 0 Prolene suture. Sterile bandages were applied. IMPRESSION: Successful placement of a single-lumen power injectable tunneled central venous catheter via the right internal jugular vein. Catheter tip is at the cavoatrial junction and ready for immediate use. Nonocclusive thrombus present within the right internal jugular vein likely from a prior central venous access catheter. Electronically Signed   By: Wilkie Lent M.D.   On: 09/05/2024 14:33   DG CHEST PORT 1 VIEW Result Date: 09/04/2024 EXAM: 1 VIEW(S) XRAY OF THE CHEST 09/04/2024 06:51:00 PM COMPARISON: 08/30/2024 CLINICAL HISTORY: PICC line infection, sequela. FINDINGS: LINES, TUBES AND DEVICES: Right PICC line tip at the cavoatrial junction. LUNGS AND PLEURA: Small right pleural effusion with right base atelectasis. No pneumothorax. HEART AND MEDIASTINUM: Left AICD remains in place,  unchanged. Cardiomegaly. Vascular congestion. BONES AND SOFT TISSUES: No acute osseous abnormality. IMPRESSION: 1. Cardiomegaly with vascular congestion. 2. Small right pleural effusion with right basilar atelectasis. 3. Right PICC line placement with tip at the cavoatrial junction. Electronically signed by: Franky Crease MD 09/04/2024 08:05 PM EST RP Workstation: [id number]   IR Removal Tun Cv Cath W/O FL Result Date: 09/04/2024 INDICATION: Patient with a history of acute kidney injury on chronic disease requiring short-term hemodialysis. Patient was seen in IR 08/28/2024 and received a tunneled hemodialysis catheter. Patient has experienced renal recovery and Interventional Radiology has been asked to remove tunneled dialysis catheter. EXAM: REMOVAL TUNNELED CENTRAL VENOUS CATHETER MEDICATIONS: None ANESTHESIA/SEDATION:  None FLUOROSCOPY: None COMPLICATIONS: None immediate. PROCEDURE: Informed written consent was obtained from the patient after a thorough discussion of the procedural risks, benefits and alternatives. All questions were addressed. Maximal Sterile Barrier Technique was utilized including caps, mask, sterile gowns, sterile gloves, sterile drape, hand hygiene and skin antiseptic. A timeout was performed prior to the initiation of the procedure. The patient's right chest and catheter was prepped and draped in a normal sterile fashion. Heparin  was removed from both ports of catheter. 1% lidocaine  was used for local anesthesia. Using gentle manual traction the cuff of the catheter was exposed and the catheter was removed in it's entirety. Pressure was held till hemostasis was obtained. A sterile dressing was applied. The patient tolerated the procedure well with no immediate complications. IMPRESSION: Successful RIGHT chest hemodialysis catheter removal. Procedure performed by Warren Dais, NP under supervision of Thom Hall, MD Electronically Signed   By: Thom Hall M.D.   On: 09/04/2024 15:50    DG CHEST PORT 1 VIEW Result Date: 08/30/2024 EXAM: 1 VIEW(S) XRAY OF THE CHEST 08/30/2024 06:48:31 AM COMPARISON: 08/29/2024 CLINICAL HISTORY: Pleural effusion FINDINGS: LINES, TUBES AND DEVICES: Stable right IJ CVC and right PICC in place. Left chest AICD stable in place. LUNGS AND PLEURA: Small to moderate right and small left pleural effusions, similar. Bibasilar atelectasis. Stable mild diffuse interstitial prominence consistent with pulmonary edema. No focal pulmonary opacity. No pneumothorax. HEART AND MEDIASTINUM: Cardiomegaly unchanged. BONES AND SOFT TISSUES: No acute osseous abnormality. IMPRESSION: 1. Small to moderate right and small left pleural effusions, similar to prior study. 2. Stable mild diffuse interstitial prominence consistent with pulmonary edema. Electronically signed by: Waddell Calk MD 08/30/2024 07:25 AM EST RP Workstation: [id number]   IR THORACENTESIS ASP PLEURAL SPACE W/IMG GUIDE Result Date: 08/29/2024 INDICATION: 73 year old male with hemothorax after recent fall. EXAM: ULTRASOUND GUIDED RIGHT THORACENTESIS MEDICATIONS: 10 mL% lidocaine  COMPLICATIONS: None immediate. PROCEDURE: An ultrasound guided thoracentesis was thoroughly discussed with the patient and questions answered. The benefits, risks, alternatives and complications were also discussed. The patient understands and wishes to proceed with the procedure. Written consent was obtained. Ultrasound was performed to localize and mark an adequate pocket of fluid in the right chest. The area was then prepped and draped in the normal sterile fashion. 1% Lidocaine  was used for local anesthesia. Under ultrasound guidance a 6 Fr Safe-T-Centesis catheter was introduced. Thoracentesis was performed. The catheter was removed and a dressing applied. FINDINGS: A total of approximately 1.5 liters of bloody fluid was removed. Samples were sent to the laboratory as requested by the clinical team. IMPRESSION: Successful ultrasound  guided right thoracentesis yielding 1.5 L of pleural fluid. Performed by: Kacie Matthews PA-C Electronically Signed   By: Cordella Banner   On: 08/29/2024 15:58   DG Chest 1 View Result Date: 08/29/2024 EXAM: 1 VIEW(S) XRAY OF THE CHEST 08/29/2024 12:29:00 PM COMPARISON: Yesterday's study (08/28/2024). CLINICAL HISTORY: [id number] S/P thoracentesis [id number] S/P thoracentesis. FINDINGS: LINES, TUBES AND DEVICES: Interval placement of right ankle jugular catheter with distal tip inspected position is best seen. Right-sided PICC line is unchanged. LUNGS AND PLEURA: Small pleural effusions are noted. Minimal right basilar atelectasis. No focal pulmonary opacity. No pneumothorax. HEART AND MEDIASTINUM: Stable cardiomegaly. Left-sided defibrillator is unchanged. BONES AND SOFT TISSUES: No acute osseous abnormality. IMPRESSION: 1. Small pleural effusions. 2. Stable cardiomegaly with unchanged left-sided defibrillator. 3. Right internal jugular catheter placed with distal tip in appropriate position. Unchanged right-sided PICC line. 4. Minimal right basilar atelectasis. Electronically  signed by: Lynwood Seip MD 08/29/2024 12:56 PM EST RP Workstation: [id number]   IR TUNNELED CENTRAL VENOUS CATH [facility] W IMG Result Date: 08/28/2024 INDICATION: ESRD requiring HD initiation. EXAM: TUNNELED CENTRAL VENOUS HEMODIALYSIS CATHETER PLACEMENT WITH ULTRASOUND AND FLUOROSCOPIC GUIDANCE MEDICATIONS: Ancef  2 gm IV. The antibiotic was given in an appropriate time interval prior to skin puncture. ANESTHESIA/SEDATION: Local anesthetic and single agent sedation was employed during this procedure. A total of Versed  1 mg was administered intravenously. The patient's level of consciousness and vital signs were monitored continuously by radiology nursing throughout the procedure under my direct supervision. FLUOROSCOPY: Radiation Exposure Index and estimated peak skin dose (PSD); Reference air kerma (RAK), 7.3 mGy. COMPLICATIONS: None immediate.  PROCEDURE: Informed written consent was obtained from the patient and/or patient's representative after a discussion of the risks, benefits, and alternatives to treatment. Questions regarding the procedure were encouraged and answered. The RIGHT neck and chest were prepped with chlorhexidine  in a sterile fashion, and a sterile drape was applied covering the operative field. Maximum barrier sterile technique with sterile gowns and gloves were used for the procedure. A timeout was performed prior to the initiation of the procedure. After creating a small venotomy incision, a micropuncture kit was utilized to access the internal jugular vein. Real-time ultrasound guidance was utilized for vascular access including the acquisition of a permanent ultrasound image documenting patency of the accessed vessel. The microwire was utilized to measure appropriate catheter length. A stiff Glidewire was advanced to the level of the IVC and the micropuncture sheath was exchanged for a peel-away sheath. A palindrome tunneled hemodialysis catheter measuring 19 cm from tip to cuff was tunneled in a retrograde fashion from the anterior chest wall to the venotomy incision. The catheter was then placed through the peel-away sheath with tips ultimately positioned within the superior aspect of the right atrium. Final catheter positioning was confirmed and documented with a spot radiographic image. The catheter aspirates and flushes normally. The catheter was flushed with appropriate volume heparin  dwells. The catheter exit site was secured with a 2-0 Ethilon retention suture. The venotomy incision was closed with Dermabond. Dressings were applied. The patient tolerated the procedure well without immediate post procedural complication. IMPRESSION: Successful placement of 19 cm tip to cuff tunneled hemodialysis catheter via the RIGHT internal jugular vein. The tip of the catheter is positioned at the superior cavo-atrial junction. The  catheter is ready for immediate use. Thom Hall, MD [facility] Electronically Signed   By: Thom Hall M.D.   On: 08/28/2024 16:41   DG CHEST PORT 1 VIEW Result Date: 08/28/2024 CLINICAL DATA:  Pleural effusion EXAM: PORTABLE CHEST 1 VIEW COMPARISON:  Chest radiograph dated 08/27/2024 FINDINGS: Lines/tubes: Left chest wall ICD lead projects over the right ventricle. Right upper extremity PICC tip projects over the SVC. Lungs: Low lung volumes with bronchovascular crowding. No focal consolidations. Pleura: Unchanged large right pleural effusion.  No pneumothorax. Heart/mediastinum: Right heart border is obscured. Bones: No acute osseous abnormality. IMPRESSION: 1. Unchanged large right pleural effusion. 2. Low lung volumes with bronchovascular crowding. Electronically Signed   By: Limin  Xu M.D.   On: 08/28/2024 07:26   DG CHEST PORT 1 VIEW Result Date: 08/27/2024 CLINICAL DATA:  Shortness of breath.  History of asthma. EXAM: PORTABLE CHEST 1 VIEW COMPARISON:  08/26/2024 FINDINGS: Right PICC tip in the superior vena cava proximally 1 cm superior to the superior cavoatrial junction. Stable left subclavian AICD lead and generator. Stable enlarged  cardiac silhouette. Large right pleural effusion, increased. No significant change in associated right basilar atelectasis. Clear left lung. Tortuous and partially calcified thoracic aorta. No acute bony abnormality. IMPRESSION: 1. Large right pleural effusion, increased. 2. Stable cardiomegaly. 3. Right PICC tip in the superior vena cava, proximally 1 cm superior to the superior cavoatrial junction. Electronically Signed   By: Elspeth Bathe M.D.   On: 08/27/2024 14:47   IR THORACENTESIS ASP PLEURAL SPACE W/IMG GUIDE Result Date: 08/26/2024 INDICATION: Inpatient with recent fall down 7 steps resulting in ejection from his wheelchair and history of heart failure with new right sided pleural effusion. Request  for diagnostic and therapeutic thoracentesis. EXAM: ULTRASOUND GUIDED RIGHT THORACENTESIS MEDICATIONS: 10 mL 1% lidocaine  COMPLICATIONS: None immediate. PROCEDURE: An ultrasound guided thoracentesis was thoroughly discussed with the patient and questions answered. The benefits, risks, alternatives and complications were also discussed. The patient understands and wishes to proceed with the procedure. Written consent was obtained. Ultrasound was performed to localize and mark an adequate pocket of fluid in the right chest. The area was then prepped and draped in the normal sterile fashion. 1% Lidocaine  was used for local anesthesia. Under ultrasound guidance a 6 Fr Safe-T-Centesis catheter was introduced. Thoracentesis was performed. The catheter was removed and a dressing applied. FINDINGS: A total of approximately 500 milliliters of bloody fluid was removed. Samples were sent to the laboratory as requested by the clinical team. IMPRESSION: Successful ultrasound guided right thoracentesis yielding bloody of pleural fluid. Performed by Laymon Coast, NP under the supervision of Dr. Jenna. Electronically Signed   By: Cordella Jenna   On: 08/26/2024 09:50   DG CHEST PORT 1 VIEW Result Date: 08/26/2024 EXAM: 1 VIEW(S) XRAY OF THE CHEST 08/26/2024 05:41:54 AM COMPARISON: 08/24/2024 CLINICAL HISTORY: Hemothorax FINDINGS: LINES, TUBES AND DEVICES: Right PICC line terminates at superior cavoatrial junction. Stable left single lead pacemaker in place. LUNGS AND PLEURA: Similar right pleural effusion and right basilar opacity. No pneumothorax. HEART AND MEDIASTINUM: Stable cardiomegaly. Aortic atherosclerosis. BONES AND SOFT TISSUES: No acute osseous abnormality. IMPRESSION: 1. Similar right pleural effusion and right basilar opacity, unchanged from prior study. Electronically signed by: Waddell Calk MD 08/26/2024 05:49 AM EST RP Workstation: [id number]   US  EKG SITE RITE Result Date: 08/25/2024 If Site Rite  image not attached, placement could not be confirmed due to current cardiac rhythm.  DG CHEST PORT 1 VIEW Result Date: 08/24/2024 CLINICAL DATA:  Follow-up right pleural effusion. EXAM: PORTABLE CHEST 1 VIEW COMPARISON:  08/22/2024 FINDINGS: The heart size and mediastinal contours are within normal limits. Pacemaker again noted. Small to moderate right pleural effusion and right lower lung atelectasis or infiltrate show no significant change. No pneumothorax visualized. Left lung remains grossly clear. IMPRESSION: No significant change in right pleural effusion and right lower lung atelectasis or infiltrate. Electronically Signed   By: Norleen DELENA Kil M.D.   On: 08/24/2024 07:43   CT CHEST WO CONTRAST Result Date: 08/22/2024 CLINICAL DATA:  Status post fall with right pleural effusion and shortness of breath EXAM: CT CHEST WITHOUT CONTRAST TECHNIQUE: Multidetector CT imaging of the chest was performed following the standard protocol without IV contrast. RADIATION DOSE REDUCTION: This exam was performed according to the departmental dose-optimization program which includes automated exposure control, adjustment of the mA and/or kV according to patient size and/or use of iterative reconstruction technique. COMPARISON:  Earlier same day CT chest FINDINGS: Cardiovascular: Left chest wall ICD lead terminates in the right ventricle. Multichamber cardiomegaly. No significant pericardial fluid/thickening.  Dilated main pulmonary artery measures 3.8 cm. Coronary artery calcifications and aortic atherosclerosis. Mediastinum/Nodes: Imaged thyroid gland without nodules meeting criteria for imaging follow-up by size. Mildly patulous esophagus. Left supraclavicular lymph node measures 10 mm (3:19). Unchanged prominent multi station mediastinal lymph nodes. Lungs/Pleura: The central airways are patent. Diffuse mosaic attenuation. Essentially unchanged appearance compared to earlier same day CT chest. Similar  atelectasis/consolidation involving the right upper, middle, and lower lobes. No pneumothorax. Unchanged large, mildly hyperattenuating right pleural effusion. Upper abdomen: Please see separately dictated earlier same day CT abdomen and pelvis report for detailed findings. Musculoskeletal: Thoracic spine fracture, better evaluated on same-day CT thoracic spine. Multilevel degenerative changes of the thoracic spine. Flowing anterior osteophytes over at least 4 contiguous thoracic vertebrae which can be seen in the setting of diffuse idiopathic skeletal hyperostosis (DISH). Unchanged wedging of T10 and T11. IMPRESSION: 1. Essentially unchanged appearance when compared to earlier same day chest CT. 2. Unchanged large, mildly hyperattenuating right pleural effusion, in keeping with hemothorax noted on subsequent thoracentesis. 3. Similar atelectasis/consolidation involving the right upper, middle, and lower lobes. 4. Diffuse mosaic attenuation, which can be seen in the setting of small airways disease. 5. Dilated main pulmonary artery, which can be seen in the setting of pulmonary arterial hypertension. 6. Unchanged prominent multi station mediastinal lymph nodes and left supraclavicular lymph node, likely reactive. 7.  Aortic Atherosclerosis (ICD10-I70.0). Electronically Signed   By: Limin  Xu M.D.   On: 08/22/2024 16:47   DG Chest 1 View Result Date: 08/22/2024 CLINICAL DATA:  Status post right thoracentesis. EXAM: CHEST  1 VIEW COMPARISON:  None Available. FINDINGS: Stable mild cardiomegaly.  AICD remains in place. Moderate right pleural effusion and right lower lung atelectasis or infiltrate is seen. No pneumothorax visualized. IMPRESSION: No pneumothorax visualized. Moderate right pleural effusion and right lower lung atelectasis or infiltrate. Electronically Signed   By: Norleen DELENA Kil M.D.   On: 08/22/2024 12:56   CT L-SPINE NO CHARGE Result Date: 08/22/2024 EXAM: CT CHEST ABDOMEN PELVIS WITH THORACIC AND  LUMBAR SPINE RECONSTRUCTIONS 08/22/2024 03:44:00 AM TECHNIQUE: CT of the chest, abdomen, pelvis was performed without the administration of intravenous contrast. Multiplanar reformatted images are provided for review, including reconstructed images of the thoracic and lumbar spine. Automated exposure control, iterative reconstruction, and/or weight based adjustment of the mA/kV was utilized to reduce the radiation dose to as low as reasonably achievable. COMPARISON: Portable chest 07/10/2024, chest CT without contrast 07/10/2024, chest CT high resolution study 09/08/2019, abdomen and pelvis CT without contrast 05/04/2024, abdomen and pelvis CT without contrast 04/01/2024. CLINICAL HISTORY: Polytrauma, blunt. Patient fell down an unknown number of steps from his wheelchair. FINDINGS: CT CHEST: THORACIC AORTA: The aorta and great vessels are moderate to heavily calcified. No acute traumatic injury of the aorta. MEDIASTINUM: Single lead left chest pacemaker/aid, stable right ventricular wire insertion. The heart is moderately enlarged. No pericardial effusion. Left main and patchy 3-vessel coronary calcifications. Mild central pulmonary venous distention again noted with prominent pulmonary trunk measuring 3.4 cm, indicating arterial hypertension. A 1.2 cm precarinal lymph node to the right is again noted with similar sized left-sided prevascular nodes also again seen. Patulous esophagus with normal wall thickness. The trachea and main bronchi are patent. There is mild diffuse bronchial thickening. No mediastinal hematoma or pneumomediastinum. No acute traumatic injury to the heart or pericardium. LUNGS: Interval increased moderate sized right pleural effusion. Most of this does layer posteriorly, but some of it could be loculated in the chest base anteriorly.  The density of the right pleural effusion is greater than simple fluid, measuring up to 33 Hounsfield units. A component of pleural hemorrhage or proteinaceous  fluid such as due to infection or empyema, is not excluded. There is no air in the pleural fluid and no pneumothorax. There is consolidation versus compressive collapse over a majority of the right middle and lower lobes. Underlying pneumonia is a consideration. Nodules previously newly identified in the base of the right upper lobe and in the right lower lobe, would be obscured by the pleural fluid if still present. Mosaic attenuation of the lungs continues to be seen, consistent with air trapping and small airway disease. The lungs are otherwise clear. No acute traumatic injury to the lungs. No pulmonary contusion or laceration. There previously was a small left pleural effusion, which has cleared. CHEST WALL: No acute displaced rib fracture. No chest wall hematoma. No chest wall mass. CT ABDOMEN AND PELVIS: ABDOMINAL AORTA: Heavy aortic and branch vessel atherosclerosis without aneurysm. No acute traumatic injury of the aorta or iliac arteries. HEPATOBILIARY: Hypoattenuating lesions in the dome of the liver, measuring 4.6 x 3.9 x 2 cm in segments 8 and 2 have been previously characterized as hemangiomas on a study of 09/19/2018. There have been no significant interval changes. The liver is dense, which could be seen with amiodarone  or heavy metal toxicity as well as hemochromatosis. There is cholelithiasis without gallbladder wall thickening or biliary dilatation. No acute liver abnormality is seen without contrast. SPLEEN: No acute traumatic injury. PANCREAS: No acute traumatic injury. ADRENAL GLANDS: No adrenal hemorrhage is seen. No acute traumatic injury. KIDNEYS: No renal or perinephric hemorrhage is seen. There is chronic perinephric stranding. No urinary stone or obstruction. No contour deforming mass of the unenhanced kidneys is seen. No acute traumatic injury. No hydronephrosis. GI TRACT: There is no bowel obstruction or inflammation. The gastric wall is unremarkable. The appendix is not seen. There is  advanced sigmoid diverticulosis without diverticulitis. Small fat-containing umbilical hernia. No acute traumatic injury of the bowel. PERITONEUM: No ascites or free air. No free fluid or free hemorrhage. RETROPERITONEUM: No retroperitoneal hematoma. BLADDER: The bladder is thickened but also not fully distended. Correlate clinically for cystitis. REPRODUCTIVE ORGANS: There are dystrophic calcifications within a mildly enlarged prostate gland. BONES: No acute traumatic fracture of the pelvis. 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 No acute regional skeletal fracture at the levels of the abdomen and pelvis. SOFT TISSUES: There is increased skin thickening and subcutaneous stranding over the low anterior abdominal wall, which could be congestive or due to cellulitis. Mild anasarca is seen in portions of the remaining body wall. THORACIC AND LUMBAR SPINE: BONES AND ALIGNMENT: *   **Thoracic Spine:** * Acute oblique nondisplaced fracture through the mid to anterior T6 vertebral body extending through bridging enthesopathic bone, without evidence of fracture transmission into the posterior elements and pedicles. There is no loss of vertebral height at T6. * Broad-based mid to lower thoracic kyphosis centered at T10-T11, where there is again noted bulky bridging bone anteriorly between the segments, moderate wedging of both segments, with interbody ankylosis consistent with old injury, with old spinous process removal at both levels with dorsal bone graft fusion. . * The rest of the thoracic spine also has extensive bridging enthesopathy beginning at T2, resulting in multilevel interbody fusions. This is eccentric to the right anterolateral aspect. There is ankylosis over multiple spinous processes. Facet joints are also fused at multiple levels with  facet hypertrophy. * Above and below T10 and T11 the thoracic vertebrae are normal in height. There is osteopenia. *   **Lumbar Spine:** *   No  acute fracture is evident. * Chronic grade 1 degenerative anterolisthesis at L4-L5 without other abnormal alignment. * Advanced lumbar marginal osteophytosis with bulky bridging osteophytes T12-L1, L1-L2, L2-L3, and L4-L5 with attempted bridging osteophytes at L3-L4 and L5-S1. * The discs are of normal height apart from chronic disc space widening at L3-L4. Thank you *   Advanced facet hypertrophy most levels. * The spinous processes were previously removed at L2, L3, and L4 with spinous process shaving at L5. DEGENERATIVE CHANGES: * **Thoracic Spine:** Varying degrees of multilevel acquired foraminal stenosis, but this is most severe on the left at T1-T2, on the right at T4-T5, on the right greater than left at T10-T11, on the right at T11-T12. Posterior endplate ridging at the level of T10-T11 does flatten the ventral thecal sac and cause 7 mm thecal sac AP stenosis and probable mild flattening of the ventral cord without frank impression. Other levels do not show noteworthy soft tissue or bony encroachment on the thecal sac. *   **Lumbar Spine:** Acquired spinal stenosis is noted all levels except L1-L2, due to ligamentous facet hypertrophy and posterior endplate ridging, but it is most severe at L3-L4 and L4-L5. Foraminal stenosis is moderate to severe bilaterally from L2-L3 through L4-L5. SOFT TISSUES: * **Thoracic Spine:** Mild edema in the adjacent soft tissues at T6, but there is no appreciable spinal canal hematoma. No paraspinal mass or hematoma. *   **Lumbar Spine:** No paraspinal mass or hematoma. IMPRESSION: 1. Acute oblique nondisplaced mid to anterior T6 vertebral body fracture extending through bridging enthesopathic bone without posterior element involvement, no vertebral height loss, with mild adjacent soft tissue edema and no appreciable spinal canal hematoma. 2. Interval increased moderate-sized right pleural effusion demonstrating increased attenuation suggesting hemorrhagic or proteinaceous  content; no pneumothorax. The rib cage is intact . 3. Right middle and lower lobe consolidation versus compressive atelectasis, with pneumonia and secondary empyema a consideration. 4. 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 5. Severe lumbar spinal stenosis at L3-L4 and L4-L5 with moderate to severe bilateral foraminal stenosis from L2-3 through L4-5. 6. Enlarged prostate. 7. Dense liver suggesting metallic or amiodarone  toxicity. 8. Cholelithiasis. 9. Cardiomegaly with aortic and coronary atherosclerosis. 10. Body wall edema, with increased skin thickening and stranding in the lower end of the ear canal which could represent cerumen or mastoid cellulitis. 11. Advanced sigmoid diverticulosis and additional detailed findings discussed above. Electronically signed by: Francis Quam MD 08/22/2024 05:00 AM EST RP Workstation: [id number]   CT T-SPINE NO CHARGE Result Date: 08/22/2024 EXAM: CT CHEST ABDOMEN PELVIS WITH THORACIC AND LUMBAR SPINE RECONSTRUCTIONS 08/22/2024 03:44:00 AM TECHNIQUE: CT of the chest, abdomen, pelvis was performed without the administration of intravenous contrast. Multiplanar reformatted images are provided for review, including reconstructed images of the thoracic and lumbar spine. Automated exposure control, iterative reconstruction, and/or weight based adjustment of the mA/kV was utilized to reduce the radiation dose to as low as reasonably achievable. COMPARISON: Portable chest 07/10/2024, chest CT without contrast 07/10/2024, chest CT high resolution study 09/08/2019, abdomen and pelvis CT without contrast 05/04/2024, abdomen and pelvis CT without contrast 04/01/2024. CLINICAL HISTORY: Polytrauma, blunt. Patient fell down an unknown number of steps from  his wheelchair.  FINDINGS: CT CHEST: THORACIC AORTA: The aorta and great vessels are moderate to heavily calcified. No acute traumatic injury of the aorta. MEDIASTINUM: Single lead left chest pacemaker/aid, stable right ventricular wire insertion. The heart is moderately enlarged. No pericardial effusion. Left main and patchy 3-vessel coronary calcifications. Mild central pulmonary venous distention again noted with prominent pulmonary trunk measuring 3.4 cm, indicating arterial hypertension. A 1.2 cm precarinal lymph node to the right is again noted with similar sized left-sided prevascular nodes also again seen. Patulous esophagus with normal wall thickness. The trachea and main bronchi are patent. There is mild diffuse bronchial thickening. No mediastinal hematoma or pneumomediastinum. No acute traumatic injury to the heart or pericardium. LUNGS: Interval increased moderate sized right pleural effusion. Most of this does layer posteriorly, but some of it could be loculated in the chest base anteriorly. The density of the right pleural effusion is greater than simple fluid, measuring up to 33 Hounsfield units. A component of pleural hemorrhage or proteinaceous fluid such as due to infection or empyema, is not excluded. There is no air in the pleural fluid and no pneumothorax. There is consolidation versus compressive collapse over a majority of the right middle and lower lobes. Underlying pneumonia is a consideration. Nodules previously newly identified in the base of the right upper lobe and in the right lower lobe, would be obscured by the pleural fluid if still present. Mosaic attenuation of the lungs continues to be seen, consistent with air trapping and small airway disease. The lungs are otherwise clear. No acute traumatic injury to the lungs. No pulmonary contusion or laceration. There previously was a small left pleural effusion, which has cleared. CHEST WALL: No acute displaced rib fracture. No chest wall hematoma. No  chest wall mass. CT ABDOMEN AND PELVIS: ABDOMINAL AORTA: Heavy aortic and branch vessel atherosclerosis without aneurysm. No acute traumatic injury of the aorta or iliac arteries. HEPATOBILIARY: Hypoattenuating lesions in the dome of the liver, measuring 4.6 x 3.9 x 2 cm in segments 8 and 2 have been previously characterized as hemangiomas on a study of 09/19/2018. There have been no significant interval changes. The liver is dense, which could be seen with amiodarone  or heavy metal toxicity as well as hemochromatosis. There is cholelithiasis without gallbladder wall thickening or biliary dilatation. No acute liver abnormality is seen without contrast. SPLEEN: No acute traumatic injury. PANCREAS: No acute traumatic injury. ADRENAL GLANDS: No adrenal hemorrhage is seen. No acute traumatic injury. KIDNEYS: No renal or perinephric hemorrhage is seen. There is chronic perinephric stranding. No urinary stone or obstruction. No contour deforming mass of the unenhanced kidneys is seen. No acute traumatic injury. No hydronephrosis. GI TRACT: There is no bowel obstruction or inflammation. The gastric wall is unremarkable. The appendix is not seen. There is advanced sigmoid diverticulosis without diverticulitis. Small fat-containing umbilical hernia. No acute traumatic injury of the bowel. PERITONEUM: No ascites or free air. No free fluid or free hemorrhage. RETROPERITONEUM: No retroperitoneal hematoma. BLADDER: The bladder is thickened but also not fully distended. Correlate clinically for cystitis. REPRODUCTIVE ORGANS: There are dystrophic calcifications within a mildly enlarged prostate gland. BONES: No acute traumatic fracture of the pelvis. 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 No acute regional skeletal fracture at the levels of the abdomen and pelvis. SOFT TISSUES: There is increased skin thickening and subcutaneous stranding over the low anterior abdominal wall, which could be  congestive or due to cellulitis. Mild anasarca is seen in  portions of the remaining body wall. THORACIC AND LUMBAR SPINE: BONES AND ALIGNMENT: *   **Thoracic Spine:** * Acute oblique nondisplaced fracture through the mid to anterior T6 vertebral body extending through bridging enthesopathic bone, without evidence of fracture transmission into the posterior elements and pedicles. There is no loss of vertebral height at T6. * Broad-based mid to lower thoracic kyphosis centered at T10-T11, where there is again noted bulky bridging bone anteriorly between the segments, moderate wedging of both segments, with interbody ankylosis consistent with old injury, with old spinous process removal at both levels with dorsal bone graft fusion. . * The rest of the thoracic spine also has extensive bridging enthesopathy beginning at T2, resulting in multilevel interbody fusions. This is eccentric to the right anterolateral aspect. There is ankylosis over multiple spinous processes. Facet joints are also fused at multiple levels with facet hypertrophy. * Above and below T10 and T11 the thoracic vertebrae are normal in height. There is osteopenia. *   **Lumbar Spine:** *   No acute fracture is evident. * Chronic grade 1 degenerative anterolisthesis at L4-L5 without other abnormal alignment. * Advanced lumbar marginal osteophytosis with bulky bridging osteophytes T12-L1, L1-L2, L2-L3, and L4-L5 with attempted bridging osteophytes at L3-L4 and L5-S1. * The discs are of normal height apart from chronic disc space widening at L3-L4. Thank you *   Advanced facet hypertrophy most levels. * The spinous processes were previously removed at L2, L3, and L4 with spinous process shaving at L5. DEGENERATIVE CHANGES: * **Thoracic Spine:** Varying degrees of multilevel acquired foraminal stenosis, but this is most severe on the left at T1-T2, on the right at T4-T5, on the right greater than left at T10-T11, on the right at T11-T12. Posterior  endplate ridging at the level of T10-T11 does flatten the ventral thecal sac and cause 7 mm thecal sac AP stenosis and probable mild flattening of the ventral cord without frank impression. Other levels do not show noteworthy soft tissue or bony encroachment on the thecal sac. *   **Lumbar Spine:** Acquired spinal stenosis is noted all levels except L1-L2, due to ligamentous facet hypertrophy and posterior endplate ridging, but it is most severe at L3-L4 and L4-L5. Foraminal stenosis is moderate to severe bilaterally from L2-L3 through L4-L5. SOFT TISSUES: * **Thoracic Spine:** Mild edema in the adjacent soft tissues at T6, but there is no appreciable spinal canal hematoma. No paraspinal mass or hematoma. *   **Lumbar Spine:** No paraspinal mass or hematoma. IMPRESSION: 1. Acute oblique nondisplaced mid to anterior T6 vertebral body fracture extending through bridging enthesopathic bone without posterior element involvement, no vertebral height loss, with mild adjacent soft tissue edema and no appreciable spinal canal hematoma. 2. Interval increased moderate-sized right pleural effusion demonstrating increased attenuation suggesting hemorrhagic or proteinaceous content; no pneumothorax. The rib cage is intact . 3. Right middle and lower lobe consolidation versus compressive atelectasis, with pneumonia and secondary empyema a consideration. 4. 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 5. Severe lumbar spinal stenosis at L3-L4 and L4-L5 with moderate to severe bilateral foraminal stenosis from L2-3 through L4-5. 6. Enlarged prostate. 7. Dense liver suggesting metallic or amiodarone  toxicity. 8. Cholelithiasis. 9. Cardiomegaly with aortic and coronary atherosclerosis. 10. Body wall edema, with increased skin  thickening and stranding in the lower end of the ear canal which could represent  cerumen or mastoid cellulitis. 11. Advanced sigmoid diverticulosis and additional detailed findings discussed above. Electronically signed by: Francis Quam MD 08/22/2024 05:00 AM EST RP Workstation: [id number]   CT CHEST ABDOMEN PELVIS WO CONTRAST Result Date: 08/22/2024 EXAM: CT CHEST ABDOMEN PELVIS WITH THORACIC AND LUMBAR SPINE RECONSTRUCTIONS 08/22/2024 03:44:00 AM TECHNIQUE: CT of the chest, abdomen, pelvis was performed without the administration of intravenous contrast. Multiplanar reformatted images are provided for review, including reconstructed images of the thoracic and lumbar spine. Automated exposure control, iterative reconstruction, and/or weight based adjustment of the mA/kV was utilized to reduce the radiation dose to as low as reasonably achievable. COMPARISON: Portable chest 07/10/2024, chest CT without contrast 07/10/2024, chest CT high resolution study 09/08/2019, abdomen and pelvis CT without contrast 05/04/2024, abdomen and pelvis CT without contrast 04/01/2024. CLINICAL HISTORY: Polytrauma, blunt. Patient fell down an unknown number of steps from his wheelchair. FINDINGS: CT CHEST: THORACIC AORTA: The aorta and great vessels are moderate to heavily calcified. No acute traumatic injury of the aorta. MEDIASTINUM: Single lead left chest pacemaker/aid, stable right ventricular wire insertion. The heart is moderately enlarged. No pericardial effusion. Left main and patchy 3-vessel coronary calcifications. Mild central pulmonary venous distention again noted with prominent pulmonary trunk measuring 3.4 cm, indicating arterial hypertension. A 1.2 cm precarinal lymph node to the right is again noted with similar sized left-sided prevascular nodes also again seen. Patulous esophagus with normal wall thickness. The trachea and main bronchi are patent. There is mild diffuse bronchial thickening. No mediastinal  hematoma or pneumomediastinum. No acute traumatic injury to the heart or pericardium. LUNGS: Interval increased moderate sized right pleural effusion. Most of this does layer posteriorly, but some of it could be loculated in the chest base anteriorly. The density of the right pleural effusion is greater than simple fluid, measuring up to 33 Hounsfield units. A component of pleural hemorrhage or proteinaceous fluid such as due to infection or empyema, is not excluded. There is no air in the pleural fluid and no pneumothorax. There is consolidation versus compressive collapse over a majority of the right middle and lower lobes. Underlying pneumonia is a consideration. Nodules previously newly identified in the base of the right upper lobe and in the right lower lobe, would be obscured by the pleural fluid if still present. Mosaic attenuation of the lungs continues to be seen, consistent with air trapping and small airway disease. The lungs are otherwise clear. No acute traumatic injury to the lungs. No pulmonary contusion or laceration. There previously was a small left pleural effusion, which has cleared. CHEST WALL: No acute displaced rib fracture. No chest wall hematoma. No chest wall mass. CT ABDOMEN AND PELVIS: ABDOMINAL AORTA: Heavy aortic and branch vessel atherosclerosis without aneurysm. No acute traumatic injury of the aorta or iliac arteries. HEPATOBILIARY: Hypoattenuating lesions in the dome of the liver, measuring 4.6 x 3.9 x 2 cm in segments 8 and 2 have been previously characterized as hemangiomas on a study of 09/19/2018. There have been no significant interval changes. The liver is dense, which could be seen with amiodarone  or heavy metal toxicity as well as hemochromatosis. There is cholelithiasis without gallbladder wall thickening or biliary dilatation. No acute liver abnormality is seen without contrast. SPLEEN: No acute traumatic injury. PANCREAS: No acute traumatic injury. ADRENAL GLANDS: No  adrenal hemorrhage is seen. No acute traumatic injury. KIDNEYS: No renal or perinephric hemorrhage is seen. There is chronic perinephric stranding. No urinary stone or obstruction. No contour deforming mass of the unenhanced  kidneys is seen. No acute traumatic injury. No hydronephrosis. GI TRACT: There is no bowel obstruction or inflammation. The gastric wall is unremarkable. The appendix is not seen. There is advanced sigmoid diverticulosis without diverticulitis. Small fat-containing umbilical hernia. No acute traumatic injury of the bowel. PERITONEUM: No ascites or free air. No free fluid or free hemorrhage. RETROPERITONEUM: No retroperitoneal hematoma. BLADDER: The bladder is thickened but also not fully distended. Correlate clinically for cystitis. REPRODUCTIVE ORGANS: There are dystrophic calcifications within a mildly enlarged prostate gland. BONES: No acute traumatic fracture of the pelvis. 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 No acute regional skeletal fracture at the levels of the abdomen and pelvis. SOFT TISSUES: There is increased skin thickening and subcutaneous stranding over the low anterior abdominal wall, which could be congestive or due to cellulitis. Mild anasarca is seen in portions of the remaining body wall. THORACIC AND LUMBAR SPINE: BONES AND ALIGNMENT: *   **Thoracic Spine:** * Acute oblique nondisplaced fracture through the mid to anterior T6 vertebral body extending through bridging enthesopathic bone, without evidence of fracture transmission into the posterior elements and pedicles. There is no loss of vertebral height at T6. * Broad-based mid to lower thoracic kyphosis centered at T10-T11, where there is again noted bulky bridging bone anteriorly between the segments, moderate wedging of both segments, with interbody ankylosis consistent with old injury, with old spinous process removal at both levels with dorsal bone graft fusion. . * The rest of the  thoracic spine also has extensive bridging enthesopathy beginning at T2, resulting in multilevel interbody fusions. This is eccentric to the right anterolateral aspect. There is ankylosis over multiple spinous processes. Facet joints are also fused at multiple levels with facet hypertrophy. * Above and below T10 and T11 the thoracic vertebrae are normal in height. There is osteopenia. *   **Lumbar Spine:** *   No acute fracture is evident. * Chronic grade 1 degenerative anterolisthesis at L4-L5 without other abnormal alignment. * Advanced lumbar marginal osteophytosis with bulky bridging osteophytes T12-L1, L1-L2, L2-L3, and L4-L5 with attempted bridging osteophytes at L3-L4 and L5-S1. * The discs are of normal height apart from chronic disc space widening at L3-L4. Thank you *   Advanced facet hypertrophy most levels. * The spinous processes were previously removed at L2, L3, and L4 with spinous process shaving at L5. DEGENERATIVE CHANGES: * **Thoracic Spine:** Varying degrees of multilevel acquired foraminal stenosis, but this is most severe on the left at T1-T2, on the right at T4-T5, on the right greater than left at T10-T11, on the right at T11-T12. Posterior endplate ridging at the level of T10-T11 does flatten the ventral thecal sac and cause 7 mm thecal sac AP stenosis and probable mild flattening of the ventral cord without frank impression. Other levels do not show noteworthy soft tissue or bony encroachment on the thecal sac. *   **Lumbar Spine:** Acquired spinal stenosis is noted all levels except L1-L2, due to ligamentous facet hypertrophy and posterior endplate ridging, but it is most severe at L3-L4 and L4-L5. Foraminal stenosis is moderate to severe bilaterally from L2-L3 through L4-L5. SOFT TISSUES: * **Thoracic Spine:** Mild edema in the adjacent soft tissues at T6, but there is no appreciable spinal canal hematoma. No paraspinal mass or hematoma. *   **Lumbar Spine:** No paraspinal mass or  hematoma. IMPRESSION: 1. Acute oblique nondisplaced mid to anterior T6 vertebral body fracture extending through bridging enthesopathic bone without posterior element involvement, no vertebral height  loss, with mild adjacent soft tissue edema and no appreciable spinal canal hematoma. 2. Interval increased moderate-sized right pleural effusion demonstrating increased attenuation suggesting hemorrhagic or proteinaceous content; no pneumothorax. The rib cage is intact . 3. Right middle and lower lobe consolidation versus compressive atelectasis, with pneumonia and secondary empyema a consideration. 4. 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 5. Severe lumbar spinal stenosis at L3-L4 and L4-L5 with moderate to severe bilateral foraminal stenosis from L2-3 through L4-5. 6. Enlarged prostate. 7. Dense liver suggesting metallic or amiodarone  toxicity. 8. Cholelithiasis. 9. Cardiomegaly with aortic and coronary atherosclerosis. 10. Body wall edema, with increased skin thickening and stranding in the lower end of the ear canal which could represent cerumen or mastoid cellulitis. 11. Advanced sigmoid diverticulosis and additional detailed findings discussed above. Electronically signed by: Francis Quam MD 08/22/2024 05:00 AM EST RP Workstation: [id number]    Microbiology: Results for orders placed or performed during the hospital encounter of 08/22/24  Body fluid culture w Gram Stain     Status: None   Collection Time: 08/22/24 12:22 PM   Specimen: Lung, Right; Pleural Fluid  Result Value Ref Range Status   Specimen Description PLEURAL  Final   Special Requests RIGHT LUNG  Final   Gram Stain   Final    RARE WBC PRESENT, PREDOMINANTLY PMN NO ORGANISMS SEEN    Culture   Final    NO GROWTH 3  DAYS Performed at [facility], [address], [place], [place] [address]    Report Status 08/25/2024 FINAL  Final    Labs: CBC: Recent Labs  Lab 09/09/24 0501 09/10/24 0826 09/11/24 0426 09/13/24 0730 09/14/24 0555  WBC 7.9 5.7 7.1 7.1 7.8  NEUTROABS  --  4.1  --   --   --   HGB 8.0* 6.3* 9.9* 9.6* 10.9*  HCT 25.5* 20.3* 30.0* 29.9* 34.0*  MCV 97.0 97.6 91.7 94.0 93.4  PLT 115* 80* 106* 111* 157   Basic Metabolic Panel: Recent Labs  Lab 09/09/24 0501 09/10/24 0445 09/11/24 0426 09/12/24 0500 09/13/24 0420 09/14/24 0555  NA 134* 135 133* 133* 134* 137  K 4.4 3.9 3.7 3.9 4.0 4.1  CL 99 98 95* 95* 95* 97*  CO2 25 26 26 27 28 28   GLUCOSE 91 106* 144* 124* 112* 94  BUN 42* 42* 42* 43* 46* 50*  CREATININE 2.57* 2.23* 2.08* 2.29* 2.38* 2.33*  CALCIUM  10.2 10.4* 10.3 10.4* 10.5* 10.3  MG 2.0  --  2.0  --   --   --   PHOS 3.4 3.0 3.0 2.8 2.7  --    Liver Function Tests: Recent Labs  Lab 09/10/24 0445 09/11/24 0426 09/12/24 0500 09/13/24 0420  ALBUMIN  3.8 4.0 3.9 3.8   CBG: Recent Labs  Lab 09/09/24 0807 09/09/24 1120  GLUCAP 85 128*    Discharge time spent: Time Coordinating Discharge: I spent a total of 35 minutes engaged in face-to-face discussion with the patient and/or caregivers regarding the patients care, assessment, plan, and discharge disposition. Over 50% of this time was dedicated to counseling the patient on the risks and benefits of treatment options and the discharge plan, as well as coordinating post-discharge care.   Signed: Emauri Krygier Al-Sultani, MD [facility] 09/15/2024         "

## 2024-09-15 NOTE — Progress Notes (Addendum)
 "    Advanced Heart Failure Rounding Note  Cardiologist: Vinie JAYSON Maxcy, MD  HF Cardiologist: Dr. [name]  Chief Complaint: Acute on chronic HFrEF Subjective:    DBA stopped 12/20. Co-ox 51% CVP ~9 with TR waves to 16.   Feels good this morning. Has been walking in the halls. Denies CP/SOB.   Objective:    Weight Range: 80.7 kg Body mass index is 28.72 kg/m.   Vital Signs:   Temp:  [97.7 F (36.5 C)-98.4 F (36.9 C)] 97.7 F (36.5 C) (12/22 0300) Pulse Rate:  [69-80] 69 (12/22 0300) Resp:  [19-25] 19 (12/22 0300) BP: (128-140)/(50-65) 128/54 (12/22 0300) SpO2:  [94 %-99 %] 94 % (12/22 0300) Weight:  [80.7 kg] 80.7 kg (12/22 0300) Last BM Date : 09/14/24  Weight change: Filed Weights   09/13/24 0416 09/14/24 0350 09/15/24 0300  Weight: 82 kg 83.5 kg 80.7 kg   Intake/Output:  Intake/Output Summary (Last 24 hours) at 09/15/2024 0714 Last data filed at 09/15/2024 0500 Gross per 24 hour  Intake 480 ml  Output 1350 ml  Net -870 ml     Physical Exam  General:  elderly appearing.  No respiratory difficulty Neck: JVD ~8 with TR waves to ~12 cm.  Cor: Regular rate & rhythm. No murmurs. Lungs: clear Extremities: +1 ankle edema  Neuro: alert & oriented x 3. Affect pleasant.  Telemetry   SR 70s (Personally reviewed)    Labs    CBC Recent Labs    09/13/24 0730 09/14/24 0555  WBC 7.1 7.8  HGB 9.6* 10.9*  HCT 29.9* 34.0*  MCV 94.0 93.4  PLT 111* 157   Basic Metabolic Panel Recent Labs    87/79/74 0420 09/14/24 0555  NA 134* 137  K 4.0 4.1  CL 95* 97*  CO2 28 28  GLUCOSE 112* 94  BUN 46* 50*  CREATININE 2.38* 2.33*  CALCIUM  10.5* 10.3  PHOS 2.7  --    Liver Function Tests Recent Labs    09/13/24 0420  ALBUMIN  3.8    BNP (last 3 results) Recent Labs    06/25/24 1604 07/10/24 0227 08/22/24 0903  BNP 1,585.3* 1,952.1* 525.0*   Medications:    Scheduled Medications:  acetaminophen   650 mg Oral Q6H   amiodarone   400 mg Oral Daily    apixaban   5 mg Oral BID   atorvastatin   40 mg Oral Daily   Chlorhexidine  Gluconate Cloth  6 each Topical Daily   collagenase    Topical Daily   darbepoetin (ARANESP ) injection - DIALYSIS  150 mcg Subcutaneous Q Tue-1800   hydrocerin   Topical BID   melatonin  3 mg Oral QHS   pantoprazole   40 mg Oral Daily   polyethylene glycol  17 g Oral Daily   sodium chloride  flush  10-40 mL Intracatheter Q12H   torsemide   100 mg Oral Daily    Infusions:    PRN Medications: HYDROmorphone  (DILAUDID ) injection, methocarbamol , mouth rinse, oxyCODONE , sodium chloride  flush  Assessment/Plan  Acute on chronic systolic heart failure: Primarily ischemic, prior history of infarcted RCA.  Multiple recent admission requiring milrinone . Now with progressive cardio-renal syndrome requiring HD.  - Most recent TTE 8/25 EF 35-40%, RV mildly reduced. TEE 9/25 with moderate MR - initially started on milrinone  for co-ox 59, stopped d/t hypotension and renal function - restarted on milrinone  12/8 for again co-ox 48% - switched milrinone  to dobutamine  12/9 - CO-OX slightly lower this morning, had been relatively stable with no need for inotrope's. Will  recheck.  - Volume status appears stable, CVP ~9 with TR waves to 16. Continue Torsemide  100 mg daily + weekly metolazone . Down 7lbs. I&O inaccurate.  - Place TED hose. Will send home with Ted hose, asked him to get some compression socks online or we can give him a prescription Friday at f/u.  - GDMT limited by CKD Stage IV - Hopefully home today if repeat co-ox stable. Volume and renal function stable  Mitral regurgitation: 2/2 ischemic disease. TEE 9/25 with more moderate MR by PISA, valve area, systolic blunting of pulmonary veins. - Continue HF optimization per above    AKI on CKD 4: Long standing. Now with progressive cardio-renal disease. Nephrology feels that he has a change for renal recovery with inotrope support. - Cr peaked 5 with anuria.  s/p HDC placement  and had iHD. Last session 09/02/24. HD cath removed.  - Creatinine 2.1>2.3 -> 2.4 -> 2.3 Continue to follow - Will continue to follow with Nephrology in outpatient setting - Ideally would consider SGLT2i. (Farxiga only 5$, Jardiance  more expensive for him)   PAF - Previously diagnosed on device - Amio increased to 400 mg daily 12/16 d/t PVCs - remains in SR  - Eliquis  restarted 12/21. 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 TSH okay - Frequent PVCs on DBA. Improved with increasing amiodarone  to 400 mg daily   CAD: Single vessel RCA disease - No s/s angina - Continue statin. No aspirin  with need for anticoagulation   Abnormal Chest CT  - posterior right pulmonary nodule is new since prior study + s/o cirrhosis + hepatic lesions concerning for ? Metastatic dz, though liver cirrhosis may be 2/2 HF or amio. Recent LFTs stable.  - differ further w/u to IM/PCP    Acute on chronic anemia - transfused 2 u RBCs (12/17). Hgb stable 9.6 - hgb stable  - Continue Eliquis , follow CBC  GOC -DNR/DNI -Not a candidate for advanced therapies.   HF Meds for D/C   Torsemide  100 mg daily  Metolazone  5 mg  every Monday + 40 meq Potassium  Amiodarone  400 mg daily  Eliquis  5 mg twice a day   HF follow up has been set up   Length of Stay: 24  Beckey LITTIE Coe, NP  09/15/2024, 7:14 AM  Advanced Heart Failure Team Pager [phone] (M-F; 7a - 5p)  Please contact [facility] for night-coverage after hours (5p -7a ) and weekends on [email or web address]  Patient seen and examined with the above-signed Advanced Practice Provider and/or Housestaff. I personally reviewed laboratory data, imaging studies and relevant notes. I independently examined the patient and formulated the important aspects of the plan. I have edited the note to reflect any of my changes or salient points. I have personally  discussed the plan with the patient and/or family.  Dobutamine  off. Feels good. Eager to go home.   General:  Sitting up in bed. No resp difficulty HEENT: normal Neck: supple. no JVD.  Cor: Regular rate & rhythm. No rubs, gallops or murmurs. Lungs: clear Abdomen: soft, nontender, nondistended.Good bowel sounds. Extremities: no cyanosis, clubbing, rash, edema Neuro: alert & orientedx3, cranial nerves grossly intact. moves all 4 extremities w/o difficulty. Affect pleasant   Much improved. Stable off DBA. Ok for d/c today on above regimen. Will follow closely in HF Clinic.   [name], MD  1:00 PM   "

## 2024-09-15 NOTE — Progress Notes (Signed)
 OT Cancellation Note  Patient Details Name: Dean Pavlov Sr. MRN: [id number] DOB: 03-22-1951   Cancelled Treatment:    Reason Eval/Treat Not Completed: Other (comment).  Attempted skilled OT treatment session.  Pt. Declines skilled OT treatment session as he reports he has already been up this morning and wants to rest as he is going home later today.    CHRISTELLA Nest Lorraine-COTA/L  09/15/2024, 9:04 AM

## 2024-09-15 NOTE — Care Management Important Message (Signed)
 Important Message  Patient Details  Name: Dean Heidelberg Sr. MRN: [id number] Date of Birth: 05/28/1951   Important Message Given:  Yes - Medicare IM     [name] 09/15/2024, 12:08 PM

## 2024-09-15 NOTE — TOC Transition Note (Signed)
 Transition of Care [facility]) - Discharge Note   Patient Details  Name: Dean Borba Sr. MRN: [id number] Date of Birth: [birth year]  Transition of Care [facility] CM/SW Contact:  Justina Delcia Czar, RN Phone Number: [phone] 09/15/2024, 9:50 AM   Clinical Narrative:    Spoke to pt at bedside. His dtr will provide transportation home.  CHF education completed. Provided patient with Living Better with Heart Failure booklet; booklet reviewed with patient. Education included the importance of daily weights, adherence to a low-sodium/heart-healthy diet, and medication compliance. Patient states he has a pillbox at home to assist with medication management.  Provided education on renal diet, including foods to limit or avoid.  Patient has a rollator available at home for mobility assistance.  Therapeutic communication provided regarding continued grief related to the loss of his wife due to an accident. Patient states he has a strong support system through family and his church community.  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  Appt arranged with PCP for 09/16/2024 at 1015 am.   Contacted [employer] rep with [employer] and they can provide HH. Message to attending to updated HF HH orders.   Final next level of care: Home w Home Health Services Barriers to Discharge: No Barriers Identified   Patient Goals and CMS Choice Patient states their goals for this hospitalization and ongoing recovery are:: wants to remain independent CMS Medicare.gov Compare Post Acute Care list provided to:: Patient Choice offered to / list presented to : NA [place] ownership interest in [facility].provided to:: Patient    Discharge Placement                       Discharge Plan and Services Additional resources added to the After Visit Summary for     Discharge Planning Services: CM Consult Post Acute Care Choice:  Home Health                    HH Arranged: RN, PT   Date [facility] Agency Contacted: 09/15/24 Time HH Agency Contacted: [phone] Representative spoke with at [facility] Agency: [place]  Social Drivers of Health (SDOH) Interventions SDOH Screenings   Food Insecurity: Patient Declined (08/22/2024)  Housing: Unknown (08/22/2024)  Transportation Needs: Patient Declined (08/22/2024)  Utilities: Patient Declined (08/22/2024)  Social Connections: Unknown (08/22/2024)  Tobacco Use: Low Risk (08/22/2024)     Readmission Risk Interventions    09/15/2024    8:47 AM 05/09/2024    9:53 AM 04/22/2024   11:11 AM  Readmission Risk Prevention Plan  Transportation Screening Complete Complete Complete  PCP or Specialist Appt within 5-7 Days   Complete  Home Care Screening   Complete  Medication Review (RN CM)   Complete  Medication Review [employer]) Complete Complete   PCP or Specialist appointment within 3-5 days of discharge Complete Complete   HRI or Home Care Consult Complete Complete   SW Recovery Care/Counseling Consult Complete Complete   Palliative Care Screening Not Applicable Not Applicable   Skilled Nursing Facility Not Applicable Not Applicable

## 2024-09-15 NOTE — Plan of Care (Signed)
   Problem: Education: Goal: Knowledge of General Education information will improve Description Including pain rating scale, medication(s)/side effects and non-pharmacologic comfort measures Outcome: Progressing   Problem: Health Behavior/Discharge Planning: Goal: Ability to manage health-related needs will improve Outcome: Progressing

## 2024-09-18 NOTE — Progress Notes (Incomplete)
 "  ADVANCED HEART FAILURE FOLLOW UP CLINIC NOTE  Referring Physician: [name], MD  Primary Care: [name], MD Primary Cardiologist:  HPI: Dean Ihde Sr. is a 73 y.o. male who presents for follow up of chronic systolic heart failure.     Cardiac history dates back to 2017 when he suffered VT/VF arrest requiring CPR/defibrillation. Cardiac cath demonstrated 90% ostial RCA and 100% m to d RCA with collaterals. CAD treated medically. Echo with LVEF 35-40% and AK of basal inferior and apical myocardium. Later underwent single chamber medtronic ICD in 09/2016. Since then EF has been in range of 30-35%.    Admitted in 12/23 d/t ICD shock for VT/VF in setting of amiodarone  noncompliance.   He was admitted in 2/25 with GI bleed requiring transfusions after polypectomy by hot snare with clips.   He was admitted 07/27-07/29/25 with acute on chronic CHF. He was diuresed with IV lasix .   Presented 04/2024 with shortness of breath and dysuria (prior UTI). Became cold on exam with uptrending lactic acid and underwent RHC showing severely elevated filling pressures and reduced cardiac index. Required IV milrinone  with improvement, weaned off with aggressive diuresis prior to discharge.     SUBJECTIVE: Admitted earlier this month after a fall, noted to have low output HF and AKI. Required DBA to help diurese with cardiorenal syndrome (hypotensive with milrinone ). Did require iHD for a ccouple of days. Inially thought to require possible home DBA but was able to wean off. GDMT limited with CKD stage IV.   Today he returns for post hospital follow up. Overall feeling ***. Denies palpitations, CP, dizziness, edema, or PND/Orthopnea. *** SOB. Appetite ok. No fever or chills. Weight at home *** pounds. Taking all medications. Denies ETOH, tobacco or drug use.   PMH, current medications, allergies, social history, and family history reviewed in epic.  PHYSICAL EXAM: There were no vitals filed  for this visit. General:  *** appearing.  No respiratory difficulty Neck: JVD *** cm.  Cor: Regular rate & rhythm. No murmurs. Lungs: clear Extremities: no edema  Neuro: alert & oriented x 3. Affect pleasant.    DATA REVIEW  ECG 05/05/2024: NSIVCD, LBBB apperance, sinus  ECHO: 05/08/2024: LVEF 35-40%, mild grade II DD, mildly reduced RV function, posterior leaflet restricted with moderate to severe MR   CATH: 05/06/2024: RA 15, PA 64/27 (39), PCWP 25 with v waves to 40, TD CO/CI 3.6/1.8, PAPi 2.5  08/2016: RCA occluded with left to right collaterals  ASSESSMENT & PLAN:  Chronic systolic heart failure: Primarily ischemic, prior history of infarcted RCA. TEE with more moderate MR by PISA, valve area, systolic blunting of pulmonary veins. - TTE 8/25 EF 35-40%, RV mildly reduced. TEE 9/25 with moderate MR  - Continue Torsemide  100 mg daily + weekly metolazone .  - Previously off SGLT-2 with recent UTI. Plan to reconsider.  - CKD limits MRA and RAASi - Off hydral/Imdur  with soft BPs during recent admission - ICD in place - Titrate medical therapy prior to repeating echocardiogram.  If persistently reduced may benefit from CRT upgrade - DNR/DNI, not a candidate for advanced therapies .  Mitral regurgitation: 2/2 ischemic disease.  Moderate on most recent TEE - TEE as above - Continue diuresis and med optimization  PAF - Previously diagnosed on device - SR today. No AF on device check 08/04. - Recently amio increased to 400 mg daily with high PVC burden *** - Continue Eliquis  5 mg twice a day.     Hx  VT/VF - Has single chamber ICD - Continue amiodarone  200 mg daily  CKD stage IIIb: Long standing. - Recent creatinine 2.36.  - Consider rechallenge with SGLT-2 in the future (Farxiga only 5$, Jardiance  more expensive for him)  - Follows with nephrology ***. Follow up ***  CAD: Single vessel RCA disease. - No anginal pain    Follow up ***  Beckey LITTIE Coe AGACNP-BC  Advanced  Heart Failure 09/18/2024  "

## 2024-09-19 ENCOUNTER — Ambulatory Visit (HOSPITAL_COMMUNITY)

## 2024-09-29 ENCOUNTER — Ambulatory Visit

## 2024-09-29 ENCOUNTER — Ambulatory Visit (HOSPITAL_COMMUNITY)

## 2024-10-01 NOTE — Progress Notes (Signed)
 No ICM remote transmission received for 09/29/2024 and next ICM transmission scheduled for 10/27/2024.

## 2024-10-02 ENCOUNTER — Telehealth (HOSPITAL_COMMUNITY): Payer: Self-pay | Admitting: Cardiology

## 2024-10-02 NOTE — Telephone Encounter (Signed)
 HHRN called to request VO for CMET  Questions if patient has been taking Kcl and would like labs to be drawn to adjust po supplement if needed  Please advise

## 2024-10-09 ENCOUNTER — Telehealth (HOSPITAL_COMMUNITY): Payer: Self-pay | Admitting: Cardiology

## 2024-10-09 NOTE — Telephone Encounter (Signed)
 Called to confirm/remind patient of their appointment at the [facility] on 10/09/2024.   Appointment:   [] Confirmed  [x] Left mess   [] No answer/No voice mail  [] VM Full/unable to leave message  [] Phone not in service  Patient reminded to bring all medications and/or complete list.  Confirmed patient has transportation. Gave directions, instructed to utilize valet parking.

## 2024-10-10 ENCOUNTER — Ambulatory Visit (HOSPITAL_COMMUNITY): Payer: Self-pay | Admitting: Cardiology

## 2024-10-10 ENCOUNTER — Ambulatory Visit (HOSPITAL_COMMUNITY)
Admission: RE | Admit: 2024-10-10 | Discharge: 2024-10-10 | Disposition: A | Discharge status: Home / Self Care | Source: Ambulatory Visit | Attending: Cardiology | Admitting: Cardiology

## 2024-10-10 ENCOUNTER — Other Ambulatory Visit (HOSPITAL_COMMUNITY): Payer: Self-pay

## 2024-10-10 ENCOUNTER — Encounter (HOSPITAL_COMMUNITY): Payer: Self-pay | Admitting: Cardiology

## 2024-10-10 ENCOUNTER — Other Ambulatory Visit (HOSPITAL_COMMUNITY): Payer: Self-pay | Admitting: Cardiology

## 2024-10-10 VITALS — BP 134/52 | HR 64 | Ht 66.0 in | Wt 180.8 lb

## 2024-10-10 DIAGNOSIS — I255 Ischemic cardiomyopathy: Secondary | ICD-10-CM | POA: Insufficient documentation

## 2024-10-10 DIAGNOSIS — I5042 Chronic combined systolic (congestive) and diastolic (congestive) heart failure: Secondary | ICD-10-CM

## 2024-10-10 DIAGNOSIS — Z79899 Other long term (current) drug therapy: Secondary | ICD-10-CM | POA: Insufficient documentation

## 2024-10-10 DIAGNOSIS — I480 Paroxysmal atrial fibrillation: Secondary | ICD-10-CM | POA: Insufficient documentation

## 2024-10-10 DIAGNOSIS — I5084 End stage heart failure: Secondary | ICD-10-CM | POA: Insufficient documentation

## 2024-10-10 DIAGNOSIS — N185 Chronic kidney disease, stage 5: Secondary | ICD-10-CM | POA: Insufficient documentation

## 2024-10-10 DIAGNOSIS — Z8674 Personal history of sudden cardiac arrest: Secondary | ICD-10-CM | POA: Insufficient documentation

## 2024-10-10 DIAGNOSIS — Z7901 Long term (current) use of anticoagulants: Secondary | ICD-10-CM | POA: Insufficient documentation

## 2024-10-10 DIAGNOSIS — I5022 Chronic systolic (congestive) heart failure: Secondary | ICD-10-CM | POA: Insufficient documentation

## 2024-10-10 DIAGNOSIS — I340 Nonrheumatic mitral (valve) insufficiency: Secondary | ICD-10-CM | POA: Diagnosis not present

## 2024-10-10 DIAGNOSIS — Z95810 Presence of automatic (implantable) cardiac defibrillator: Secondary | ICD-10-CM | POA: Diagnosis not present

## 2024-10-10 DIAGNOSIS — I2510 Atherosclerotic heart disease of native coronary artery without angina pectoris: Secondary | ICD-10-CM | POA: Diagnosis not present

## 2024-10-10 LAB — BASIC METABOLIC PANEL WITH GFR
Anion gap: 14 (ref 5–15)
BUN: 76 mg/dL — ABNORMAL HIGH (ref 8–23)
CO2: 25 mmol/L (ref 22–32)
Calcium: 10.5 mg/dL — ABNORMAL HIGH (ref 8.9–10.3)
Chloride: 95 mmol/L — ABNORMAL LOW (ref 98–111)
Creatinine, Ser: 2.27 mg/dL — ABNORMAL HIGH (ref 0.61–1.24)
GFR, Estimated: 30 mL/min — ABNORMAL LOW
Glucose, Bld: 104 mg/dL — ABNORMAL HIGH (ref 70–99)
Potassium: 3.7 mmol/L (ref 3.5–5.1)
Sodium: 133 mmol/L — ABNORMAL LOW (ref 135–145)

## 2024-10-10 MED ORDER — ALLOPURINOL 100 MG PO TABS: 200.0000 mg | ORAL_TABLET | Freq: Every day | ORAL | 3 refills | Status: AC

## 2024-10-10 MED ORDER — ALLOPURINOL 200 MG PO TABS: 200.0000 mg | ORAL_TABLET | Freq: Every day | ORAL | 3 refills | Status: AC

## 2024-10-10 MED ORDER — POTASSIUM CHLORIDE 20 MEQ PO PACK: 20.0000 meq | PACK | ORAL | 5 refills | Status: AC

## 2024-10-10 MED ORDER — AMIODARONE HCL 200 MG PO TABS: 200.0000 mg | ORAL_TABLET | Freq: Every day | ORAL | 3 refills | Status: AC

## 2024-10-10 NOTE — Patient Instructions (Signed)
 CHANGE Amiodarone  to 200 mg daily.  CHANGE Allopurinol  to 200 mg daily.  Labs done today, your results will be available in MyChart, we will contact you for abnormal readings.  Your physician recommends that you schedule a follow-up appointment in: 6 weeks.  If you have any questions or concerns before your next appointment please send us  a message through [place] or call our office at [phone].    TO LEAVE A MESSAGE FOR THE NURSE SELECT OPTION 2, PLEASE LEAVE A MESSAGE INCLUDING: YOUR NAME DATE OF BIRTH CALL BACK NUMBER REASON FOR CALL**this is important as we prioritize the call backs  YOU WILL RECEIVE A CALL BACK THE SAME DAY AS LONG AS YOU CALL BEFORE 4:00 PM  At the [facility], you and your health needs are our priority. As part of our continuing mission to provide you with exceptional heart care, we have created designated Provider Care Teams. These Care Teams include your primary Cardiologist (physician) and Advanced Practice Providers (APPs- Physician Assistants and Nurse Practitioners) who all work together to provide you with the care you need, when you need it.   You may see any of the following providers on your designated Care Team at your next follow up: Dr [name] Dr Ezra Shuck Dr. [name] Greig Mosses, NP Caffie Shed, [place] [facility], [place] Beckey Coe, NP Jordan Lee, NP Ellouise Class, NP Tinnie Redman, PharmD Jaun Bash, PharmD   Please be sure to bring in all your medications bottles to every appointment.    Thank you for choosing  [facility]

## 2024-10-12 NOTE — Progress Notes (Signed)
 "  ADVANCED HEART FAILURE FOLLOW UP CLINIC NOTE  Referring Physician: [name], MD  Primary Care: [name], MD Primary Cardiologist:  HPI: Dean Elgersma Sr. is a 74 y.o. male who presents for follow up of chronic systolic heart failure.      Cardiac history dates back to 2017 when he suffered VT/VF arrest requiring CPR/defibrillation. Cardiac cath demonstrated 90% ostial RCA and 100% m to d RCA with collaterals. CAD treated medically. Echo with LVEF 35-40% and AK of basal inferior and apical myocardium. Later underwent single chamber medtronic ICD in 09/2016. Since then EF has been in range of 30-35%.    Admitted in 12/23 d/t ICD shock for VT/VF in setting of amiodarone  noncompliance.   He was admitted in 2/25 with GI bleed requiring transfusions after polypectomy by hot snare with clips.   He was admitted 07/27-07/29/25 with acute on chronic CHF. He was diuresed with IV lasix .   Presented 04/2024 with shortness of breath and dysuria (prior UTI). Became cold on exam with uptrending lactic acid and underwent RHC showing severely elevated filling pressures and reduced cardiac index. Required IV milrinone  with improvement, weaned off with aggressive diuresis prior to discharge.   Recent prolonged admission for acute on chronic systolic heart failure, briefly required hemodialysis.  Long inotrope wean before discharge, insurance would not cover home inotropes     SUBJECTIVE:  Accompanied by his daughter today, reports that overall he has been very weak.  He has difficulty swallowing his potassium pills.  He also has not had physical therapy working at the house and has found it difficult to move around very much due to prior back surgeries.  PMH, current medications, allergies, social history, and family history reviewed in epic.  PHYSICAL EXAM: Vitals:   10/10/24 1213  BP: (!) 134/52  Pulse: 64  SpO2: 100%   GENERAL: NAD, chronically ill appearing PULM:  Normal work of  breathing, CTAB CARDIAC:  JVP: midly elevated      RRR, systolic murmur, 1+ LE edema, some dependent ABDOMEN: Soft, non-tender, non-distended. NEUROLOGIC: Patient is oriented x3 with no focal or lateralizing neurologic deficits.      DATA REVIEW  ECG 05/05/2024: NSIVCD, LBBB apperance, sinus  ECHO: 05/08/2024: LVEF 35-40%, mild grade II DD, mildly reduced RV function, posterior leaflet restricted with moderate to severe MR   CATH: 05/06/2024: RA 15, PA 64/27 (39), PCWP 25 with v waves to 40, TD CO/CI 3.6/1.8, PAPi 2.5  08/2016: RCA occluded with left to right collaterals  ASSESSMENT & PLAN:  Chronic systolic heart failure: Primarily ischemic, prior history of infarcted RCA. TEE with more moderate MR by PISA, valve area, systolic blunting of pulmonary veins. Poor candidate for invasive procedures given CKD V, worsening functional status.  -ACC/AHA stage D cardiomyopathy, no advanced therapy options -Has not tolerated medical therapy -Continue torsemide  100 mg daily, metolazone  2.5 mg weekly -Adjust potassium to packets as he has not been able to tolerate the pills - If worsening, would need hospice referral - Labs today  Mitral regurgitation: 2/2 ischemic disease.  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 - No anginal pain    Follow up in 6 weeks  [name], MD Advanced Heart Failure Mechanical Circulatory Support 10/12/24  "

## 2024-10-23 NOTE — Progress Notes (Signed)
 31 day ICM Remote transmission canceled due to [facility] clinic is on hold until further notice.  91 day remote monitoring will continue per protocol.

## 2024-10-27 ENCOUNTER — Ambulatory Visit

## 2024-11-13 ENCOUNTER — Ambulatory Visit: Payer: Self-pay

## 2024-11-17 ENCOUNTER — Ambulatory Visit (HOSPITAL_COMMUNITY)

## 2024-12-09 ENCOUNTER — Ambulatory Visit: Admitting: Student

## 2025-02-12 ENCOUNTER — Ambulatory Visit: Payer: Self-pay
